# Patient Record
Sex: Male | Born: 1945 | Race: White | Hispanic: No | Marital: Married | Smoking: Former smoker
Health system: Southern US, Community
[De-identification: ages and names within clinical notes are randomized; demographics above are authoritative.]

## PROBLEM LIST (undated history)

## (undated) DIAGNOSIS — I509 Heart failure, unspecified: Secondary | ICD-10-CM

## (undated) DIAGNOSIS — E039 Hypothyroidism, unspecified: Secondary | ICD-10-CM

## (undated) DIAGNOSIS — N289 Disorder of kidney and ureter, unspecified: Secondary | ICD-10-CM

## (undated) DIAGNOSIS — R0600 Dyspnea, unspecified: Secondary | ICD-10-CM

## (undated) DIAGNOSIS — G4730 Sleep apnea, unspecified: Secondary | ICD-10-CM

## (undated) DIAGNOSIS — K5904 Chronic idiopathic constipation: Secondary | ICD-10-CM

## (undated) DIAGNOSIS — R269 Unspecified abnormalities of gait and mobility: Secondary | ICD-10-CM

## (undated) DIAGNOSIS — M1990 Unspecified osteoarthritis, unspecified site: Secondary | ICD-10-CM

## (undated) DIAGNOSIS — G959 Disease of spinal cord, unspecified: Secondary | ICD-10-CM

## (undated) DIAGNOSIS — G2581 Restless legs syndrome: Secondary | ICD-10-CM

## (undated) DIAGNOSIS — R112 Nausea with vomiting, unspecified: Secondary | ICD-10-CM

## (undated) DIAGNOSIS — J189 Pneumonia, unspecified organism: Secondary | ICD-10-CM

## (undated) DIAGNOSIS — M4714 Other spondylosis with myelopathy, thoracic region: Secondary | ICD-10-CM

## (undated) DIAGNOSIS — E119 Type 2 diabetes mellitus without complications: Secondary | ICD-10-CM

## (undated) DIAGNOSIS — J449 Chronic obstructive pulmonary disease, unspecified: Secondary | ICD-10-CM

## (undated) DIAGNOSIS — M4807 Spinal stenosis, lumbosacral region: Secondary | ICD-10-CM

## (undated) DIAGNOSIS — G629 Polyneuropathy, unspecified: Secondary | ICD-10-CM

## (undated) DIAGNOSIS — R011 Cardiac murmur, unspecified: Secondary | ICD-10-CM

## (undated) DIAGNOSIS — Z9981 Dependence on supplemental oxygen: Secondary | ICD-10-CM

## (undated) DIAGNOSIS — I4510 Unspecified right bundle-branch block: Secondary | ICD-10-CM

## (undated) DIAGNOSIS — Z9889 Other specified postprocedural states: Secondary | ICD-10-CM

## (undated) DIAGNOSIS — I10 Essential (primary) hypertension: Secondary | ICD-10-CM

## (undated) HISTORY — DX: Polyneuropathy, unspecified: G62.9

## (undated) HISTORY — DX: Spinal stenosis, lumbosacral region: M48.07

## (undated) HISTORY — DX: Heart failure, unspecified: I50.9

## (undated) HISTORY — PX: BACK SURGERY: SHX140

## (undated) HISTORY — DX: Pneumonia, unspecified organism: J18.9

## (undated) HISTORY — PX: EYE SURGERY: SHX253

## (undated) HISTORY — DX: Disease of spinal cord, unspecified: G95.9

## (undated) HISTORY — PX: CHOLECYSTECTOMY: SHX55

## (undated) HISTORY — DX: Restless legs syndrome: G25.81

## (undated) HISTORY — DX: Unspecified abnormalities of gait and mobility: R26.9

## (undated) HISTORY — DX: Other spondylosis with myelopathy, thoracic region: M47.14

---

## 1999-06-27 ENCOUNTER — Encounter: Payer: Self-pay | Admitting: Neurological Surgery

## 1999-06-27 ENCOUNTER — Inpatient Hospital Stay (HOSPITAL_COMMUNITY): Admission: RE | Admit: 1999-06-27 | Discharge: 1999-06-30 | Payer: Self-pay | Admitting: Neurological Surgery

## 1999-09-04 ENCOUNTER — Encounter: Admission: RE | Admit: 1999-09-04 | Discharge: 1999-10-02 | Payer: Self-pay | Admitting: Neurological Surgery

## 1999-11-27 ENCOUNTER — Encounter: Admission: RE | Admit: 1999-11-27 | Discharge: 1999-11-27 | Payer: Self-pay | Admitting: Neurological Surgery

## 1999-11-27 ENCOUNTER — Encounter: Payer: Self-pay | Admitting: Neurological Surgery

## 2000-05-30 ENCOUNTER — Encounter: Payer: Self-pay | Admitting: Neurological Surgery

## 2000-05-30 ENCOUNTER — Encounter: Admission: RE | Admit: 2000-05-30 | Discharge: 2000-05-30 | Payer: Self-pay | Admitting: Neurological Surgery

## 2001-07-16 ENCOUNTER — Encounter: Admission: RE | Admit: 2001-07-16 | Discharge: 2001-07-16 | Payer: Self-pay | Admitting: Neurological Surgery

## 2001-07-16 ENCOUNTER — Encounter: Payer: Self-pay | Admitting: Neurological Surgery

## 2001-07-19 ENCOUNTER — Encounter: Admission: RE | Admit: 2001-07-19 | Discharge: 2001-07-19 | Payer: Self-pay | Admitting: Neurological Surgery

## 2001-07-19 ENCOUNTER — Encounter: Payer: Self-pay | Admitting: Neurological Surgery

## 2001-08-04 ENCOUNTER — Encounter: Admission: RE | Admit: 2001-08-04 | Discharge: 2001-08-19 | Payer: Self-pay | Admitting: Neurological Surgery

## 2002-02-23 ENCOUNTER — Encounter: Admission: RE | Admit: 2002-02-23 | Discharge: 2002-02-23 | Payer: Self-pay | Admitting: Neurological Surgery

## 2002-02-23 ENCOUNTER — Encounter: Payer: Self-pay | Admitting: Neurological Surgery

## 2002-02-24 ENCOUNTER — Encounter: Admission: RE | Admit: 2002-02-24 | Discharge: 2002-02-24 | Payer: Self-pay | Admitting: Orthopedic Surgery

## 2002-02-24 ENCOUNTER — Encounter: Payer: Self-pay | Admitting: Orthopedic Surgery

## 2002-03-10 ENCOUNTER — Encounter: Payer: Self-pay | Admitting: Neurological Surgery

## 2002-03-10 ENCOUNTER — Ambulatory Visit (HOSPITAL_COMMUNITY): Admission: RE | Admit: 2002-03-10 | Discharge: 2002-03-10 | Payer: Self-pay | Admitting: Neurological Surgery

## 2004-12-09 ENCOUNTER — Ambulatory Visit (HOSPITAL_BASED_OUTPATIENT_CLINIC_OR_DEPARTMENT_OTHER): Admission: RE | Admit: 2004-12-09 | Discharge: 2004-12-09 | Payer: Self-pay | Admitting: Otolaryngology

## 2005-05-14 ENCOUNTER — Encounter: Admission: RE | Admit: 2005-05-14 | Discharge: 2005-05-14 | Payer: Self-pay | Admitting: Neurological Surgery

## 2005-06-20 ENCOUNTER — Emergency Department (HOSPITAL_COMMUNITY): Admission: EM | Admit: 2005-06-20 | Discharge: 2005-06-20 | Payer: Self-pay | Admitting: Emergency Medicine

## 2005-06-23 ENCOUNTER — Encounter: Admission: RE | Admit: 2005-06-23 | Discharge: 2005-06-23 | Payer: Self-pay | Admitting: Medical Genetics

## 2005-06-27 ENCOUNTER — Encounter: Admission: RE | Admit: 2005-06-27 | Discharge: 2005-06-27 | Payer: Self-pay | Admitting: Medical Genetics

## 2006-08-06 ENCOUNTER — Encounter: Admission: RE | Admit: 2006-08-06 | Discharge: 2006-08-06 | Payer: Self-pay | Admitting: Family Medicine

## 2006-08-28 ENCOUNTER — Encounter
Admission: RE | Admit: 2006-08-28 | Discharge: 2006-08-28 | Payer: Self-pay | Admitting: Physical Medicine and Rehabilitation

## 2006-09-08 ENCOUNTER — Encounter: Admission: RE | Admit: 2006-09-08 | Discharge: 2006-09-08 | Payer: Self-pay | Admitting: Neurological Surgery

## 2006-10-08 ENCOUNTER — Inpatient Hospital Stay (HOSPITAL_COMMUNITY): Admission: RE | Admit: 2006-10-08 | Discharge: 2006-10-09 | Payer: Self-pay | Admitting: Neurological Surgery

## 2008-07-23 ENCOUNTER — Emergency Department (HOSPITAL_BASED_OUTPATIENT_CLINIC_OR_DEPARTMENT_OTHER): Admission: EM | Admit: 2008-07-23 | Discharge: 2008-07-23 | Payer: Self-pay | Admitting: Emergency Medicine

## 2008-11-08 ENCOUNTER — Encounter: Admission: RE | Admit: 2008-11-08 | Discharge: 2008-11-08 | Payer: Self-pay | Admitting: Neurological Surgery

## 2008-11-13 ENCOUNTER — Encounter: Admission: RE | Admit: 2008-11-13 | Discharge: 2008-11-13 | Payer: Self-pay | Admitting: Neurological Surgery

## 2010-04-24 ENCOUNTER — Encounter: Admission: RE | Admit: 2010-04-24 | Discharge: 2010-04-24 | Payer: Self-pay | Admitting: Neurological Surgery

## 2010-05-09 ENCOUNTER — Inpatient Hospital Stay (HOSPITAL_COMMUNITY): Admission: RE | Admit: 2010-05-09 | Discharge: 2010-05-10 | Payer: Self-pay | Admitting: Neurological Surgery

## 2010-12-12 ENCOUNTER — Encounter
Admission: RE | Admit: 2010-12-12 | Discharge: 2010-12-12 | Payer: Self-pay | Source: Home / Self Care | Attending: Neurological Surgery | Admitting: Neurological Surgery

## 2011-01-19 ENCOUNTER — Encounter: Payer: Self-pay | Admitting: Medical Genetics

## 2011-03-18 LAB — SURGICAL PCR SCREEN
MRSA, PCR: NEGATIVE
Staphylococcus aureus: NEGATIVE

## 2011-03-18 LAB — PROTIME-INR
INR: 0.98 (ref 0.00–1.49)
Prothrombin Time: 12.9 seconds (ref 11.6–15.2)

## 2011-03-18 LAB — BASIC METABOLIC PANEL
BUN: 19 mg/dL (ref 6–23)
CO2: 26 mEq/L (ref 19–32)
Calcium: 9.5 mg/dL (ref 8.4–10.5)
GFR calc non Af Amer: >60 mL/min (ref >60)
Glucose, Bld: 138 mg/dL — ABNORMAL HIGH (ref 70–99)
Sodium: 139 mEq/L (ref 135–145)

## 2011-03-18 LAB — CARDIAC PANEL(CRET KIN+CKTOT+MB+TROPI)
Relative Index: 3 — ABNORMAL HIGH (ref 0.0–2.5)
Troponin I: 0.02 ng/mL (ref 0.00–0.06)

## 2011-03-18 LAB — DIFFERENTIAL
Eosinophils Absolute: 0.1 10*3/uL (ref 0.0–0.7)
Lymphocytes Relative: 24 % (ref 12–46)
Lymphs Abs: 1 10*3/uL (ref 0.7–4.0)
Monocytes Relative: 9 % (ref 3–12)
Neutrophils Relative %: 63 % (ref 43–77)

## 2011-03-18 LAB — CBC
HCT: 34.5 % — ABNORMAL LOW (ref 39.0–52.0)
Hemoglobin: 12.1 g/dL — ABNORMAL LOW (ref 13.0–17.0)
MCHC: 35.1 g/dL (ref 30.0–36.0)
MCV: 91.9 fL (ref 78.0–100.0)
MCV: 92.1 fL (ref 78.0–100.0)
Platelets: 172 10*3/uL (ref 150–400)
Platelets: 196 10*3/uL (ref 150–400)
RBC: 4.43 MIL/uL (ref 4.22–5.81)
RDW: 13.7 % (ref 11.5–15.5)
WBC: 4.3 10*3/uL (ref 4.0–10.5)

## 2011-03-18 LAB — APTT: aPTT: 28 seconds (ref 24–37)

## 2011-05-16 NOTE — Op Note (Signed)
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   PROCEDURE:  Thoracic hemilaminectomy, medial facetectomy, T8-9 and T9-10 on  the right, followed by transpedicular diskectomy T8-9, T9-10 on the right,  utilizing microscopic dissection.   SURGEON:  Eustace Moore, MD   ASSISTANT:  Faythe Ghee, MD.   ANESTHESIA:  General endotracheal.   COMPLICATIONS:  None apparent.   INDICATION FOR PROCEDURE:  Mr. Marshall is a 65 year old male who has very  complicated spine history.  He had a previous lumbar laminectomy in the  past.  He then became myelopathic and was found to have thoracic stenosis  and cervical spinal stenosis.  He underwent a multilevel cervical  decompression and instrumented fusion by another surgeon 1 or 2 years ago.  Over the last few months he has developed a return of lower extremity  symptoms, including spasticity of gait with numbness and pain in the legs.  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  I recommended a thoracic  decompression followed by transpedicular diskectomy.  He understood the  risks, benefits and expected outcome and wished to proceed.   DESCRIPTION OF PROCEDURE:  The patient was taken to the operating room and  after induction of adequate generalized  endotracheal anesthesia, he was  rolled into the prone position on the Wilson frame and all pressure points  were padded.  His thoracic region was prepped with DuraPrep and then draped  in the usual sterile fashion.  Local anesthesia 6 mL were injected and a  dorsal midline incision was made and carried down to the thoracic fascia.  The fascia was opened and the paraspinous musculature was taken down in a  subperiosteal fashion to expose T8-9 and T9-10.  Intraoperative x-ray  confirmed our level.  We took multiple intraoperative x-rays throughout the  case to make sure we were at the correct level.  We felt quite confident we  were at %8-9 and T9-10 based on these x-rays and called down to the  radiologist and had them read the x-rays also, and they confirmed our  levels.  We used a combination of the high-speed drill and the Kerrison  punches to perform a complete hemilaminectomy at T9-10 and a hemilaminectomy  at T8-9.  A medial facetectomy was performed.  The pedicles were palpated.  We also undercut the midline with the Kerrison punch to decompress the  midline also, performing  somewhat of a sublaminar decompression at both  levels.  I then used the high-speed drill to drill down into the pedicle and  down into the vertebra body, creating a trough up to the level of the disk  space and utilizing microscopic dissection, I was able to incise the disk  space at T8-9 and we performed a thorough intradiskal diskectomy.  We were  able to use a nerve hook and a curette to work underneath the posterior  longitudinal ligament and sweep disk down into the hole created by drilling  into the body and then we removed this with the pituitary rongeur.  We used  micro pituitaries and a micro upbiting pituitary to perform our diskectomy.  We then palpated with a nerve hook, feeling into the midline to see if we  had adequate decompression of the midline.  We felt like we had as good a  decompression as  we could possibly due through a transpedicular approach.  We then went to the T8-9 level and performed the same thing, though the disk  space was very collapsed and we had quite a bit of difficult getting into  the disk space.  We were able to perform some diskectomy here.  We knew this  is a midline disk herniation and there was very little chance we would be  able to get to a midline disk herniation from a transpedicular approach.  However, as discussed with the patient prior to surgery, we did not feel  that a thoracotomy approach was absolutely necessary.  I felt like the T9-10  level was likely more symptomatic.  We also had what we felt like was a good  posterior decompression.  Therefore, we felt it would be the better part of  valor to stop here and irrigated with saline solution.  We dried all  bleeding points with bipolar cautery and also with Surgifoam and once  meticulous hemostasis was achieved, we lined the dura with Gelfoam and we  closed the fascia with #1 Vicryl, closed the subcutaneous and subcuticular  tissue with 2-0 and 3-0 Vicryl and closed the skin with Benzoin and Steri-  Strips.  The drapes were removed.  A sterile dressing was applied.  The  patient was awakened from general anesthesia and transported to the recovery  room in stable condition.  At the end of the procedure all sponge, needle  and sponge counts were correct.      Eustace Moore, MD  Electronically Signed     DSJ/MEDQ  D:  10/08/2006  T:  10/09/2006  Job:  [id number]

## 2011-05-16 NOTE — Procedures (Signed)
Travis [name], Travis [name]               ACCOUNT NO.:  [id number]   MEDICAL RECORD NO.:  [id number]          PATIENT TYPE:  OUT   LOCATION:  SLEEP CENTER                 FACILITY:  [facility]   PHYSICIAN:  Clinton D. Annamaria Boots, M.D. DATE OF BIRTH:  1946/10/21   DATE OF STUDY:  12/09/2004                              NOCTURNAL POLYSOMNOGRAM   REFERRING PHYSICIAN:  Dr. Jerrell Belfast.   INDICATION FOR STUDY:  1.  Hypersomnia with sleep apnea.  2.  Epworth Sleepiness Score 20/24.  3.  Neck size 18 inches.  4.  BMI 22.  5.  Weight 170 pounds.   SLEEP ARCHITECTURE:  Total sleep time 263 minutes with sleep efficiency 72%.  Stage 1 was 7%, stage 2 73%, stages 3 and 4 were 5%, REM was 15% of the  total sleep time.  Sleep latency 34 minutes.  REM latency 208 minutes.  Awake after sleep onset 70 minutes.  Arousal index elevated 46, mostly due  to respiratory events.   RESPIRATORY DATA:  Split study protocol.  RDI 90.6/h indicating severe  obstructive sleep apnea/hypopnea syndrome before CPAP.  This included 169  obstructive apneas, 10 mixed apnea and 12 hypopneas before CPAP.  Events  were not positional.  REM RDI was absent.  CPAP was titrated to 12 CWP, RDI  0/h.  Apneas were eliminated at lower pressures, but snoring was eliminated  at 12 CWP.  A medium full-face mask with heated humidifier was used.   OXYGEN DATA:  Moderately loud snoring with oxygen desaturation to a nadir of  76% before CPAP.  After CPAP control, saturation held at 95-98% on room air.   CARDIAC DATA:  Normal sinus rhythm and sinus bradycardia with heart rate 55-  70 beats per minute.   MOVEMENT/PARASOMNIA:  Occasional leg jerk with insignificant impact on  sleep.  Bathroom x2.   IMPRESSION/RECOMMENDATION:  Severe obstructive sleep apnea/hypopnea  syndrome, RDI 90.6/h with oxygen desaturation to 76%.  CPAP titration to 12  CWP, RDI 0/h using a medium full-face mask with heated humidifier.                            Clinton D. Annamaria Boots, M.D.  Diplomate, [employer] Board  CDY/MEDQ  D:  12/15/2004 10:48:43  T:  12/16/2004 14:32:00  Job:  [id number]

## 2011-12-25 ENCOUNTER — Other Ambulatory Visit: Payer: Self-pay | Admitting: Neurological Surgery

## 2011-12-25 DIAGNOSIS — M545 Low back pain, unspecified: Secondary | ICD-10-CM

## 2011-12-28 ENCOUNTER — Ambulatory Visit
Admission: RE | Admit: 2011-12-28 | Discharge: 2011-12-28 | Disposition: A | Discharge status: Home / Self Care | Payer: BLUE CROSS/BLUE SHIELD | Source: Ambulatory Visit | Attending: Neurological Surgery | Admitting: Neurological Surgery

## 2011-12-28 DIAGNOSIS — M545 Low back pain, unspecified: Secondary | ICD-10-CM

## 2011-12-29 ENCOUNTER — Other Ambulatory Visit: Payer: Self-pay | Admitting: Neurological Surgery

## 2011-12-29 DIAGNOSIS — M542 Cervicalgia: Secondary | ICD-10-CM

## 2011-12-31 ENCOUNTER — Other Ambulatory Visit: Payer: BLUE CROSS/BLUE SHIELD

## 2012-01-01 ENCOUNTER — Ambulatory Visit
Admission: RE | Admit: 2012-01-01 | Discharge: 2012-01-01 | Disposition: A | Discharge status: Home / Self Care | Payer: BLUE CROSS/BLUE SHIELD | Source: Ambulatory Visit | Attending: Neurological Surgery | Admitting: Neurological Surgery

## 2012-01-01 DIAGNOSIS — M542 Cervicalgia: Secondary | ICD-10-CM

## 2012-01-28 ENCOUNTER — Encounter (HOSPITAL_COMMUNITY): Payer: Self-pay | Admitting: Pharmacy Technician

## 2012-02-02 ENCOUNTER — Other Ambulatory Visit (HOSPITAL_COMMUNITY): Payer: Self-pay | Admitting: *Deleted

## 2012-02-03 ENCOUNTER — Encounter (HOSPITAL_COMMUNITY): Payer: Self-pay

## 2012-02-03 ENCOUNTER — Encounter (HOSPITAL_COMMUNITY)
Admission: RE | Admit: 2012-02-03 | Discharge: 2012-02-03 | Disposition: A | Discharge status: Home / Self Care | Payer: Medicare Other | Source: Ambulatory Visit | Attending: Neurological Surgery | Admitting: Neurological Surgery

## 2012-02-03 ENCOUNTER — Other Ambulatory Visit: Payer: Self-pay

## 2012-02-03 HISTORY — DX: Unspecified osteoarthritis, unspecified site: M19.90

## 2012-02-03 HISTORY — DX: Nausea with vomiting, unspecified: R11.2

## 2012-02-03 HISTORY — DX: Essential (primary) hypertension: I10

## 2012-02-03 HISTORY — DX: Sleep apnea, unspecified: G47.30

## 2012-02-03 HISTORY — DX: Cardiac murmur, unspecified: R01.1

## 2012-02-03 HISTORY — DX: Other specified postprocedural states: Z98.890

## 2012-02-03 LAB — DIFFERENTIAL
Basophils Absolute: 0 10*3/uL (ref 0.0–0.1)
Basophils Relative: 0 % (ref 0–1)
Eosinophils Relative: 3 % (ref 0–5)
Monocytes Absolute: 0.5 10*3/uL (ref 0.1–1.0)
Neutro Abs: 3.4 10*3/uL (ref 1.7–7.7)

## 2012-02-03 LAB — CBC
Hemoglobin: 14 g/dL (ref 13.0–17.0)
MCH: 31.5 pg (ref 26.0–34.0)
MCV: 89 fL (ref 78.0–100.0)
Platelets: 175 10*3/uL (ref 150–400)
RBC: 4.44 MIL/uL (ref 4.22–5.81)
WBC: 5.7 10*3/uL (ref 4.0–10.5)

## 2012-02-03 LAB — COMPREHENSIVE METABOLIC PANEL
ALT: 25 U/L (ref 0–53)
AST: 21 U/L (ref 0–37)
CO2: 28 mEq/L (ref 19–32)
Chloride: 103 mEq/L (ref 96–112)
Creatinine, Ser: 1.03 mg/dL (ref 0.50–1.35)
GFR calc Af Amer: 85 mL/min — ABNORMAL LOW (ref >90)
GFR calc non Af Amer: 73 mL/min — ABNORMAL LOW (ref >90)
Glucose, Bld: 112 mg/dL — ABNORMAL HIGH (ref 70–99)
Total Bilirubin: 0.4 mg/dL (ref 0.3–1.2)

## 2012-02-03 LAB — PROTIME-INR: Prothrombin Time: 12.7 seconds (ref 11.6–15.2)

## 2012-02-03 NOTE — Progress Notes (Signed)
PT DOES NOT WANT TO WEAR BLUE ARM BAND X 8 DAYS...Marland Kitchenhe IS AWARE THAT SAMPLE WILL NEED TO BE DRAWN DOS

## 2012-02-03 NOTE — Pre-Procedure Instructions (Signed)
Travis [name]  02/03/2012   Your procedure is scheduled on:  FEB 13TH  Wednesday   Report to [place] at  10:00 AM.  Call this number if you have problems the morning of surgery: [phone]   Remember:   Do not eat food:After Midnight SUNDAY.  May have clear liquids: up to 4 Hours before arrival time  (6:00AM).  Clear liquids include soda, tea, black coffee, apple or grape juice, broth.   Take these medicines the morning of surgery with A SIP OF WATER: TOPROL X                            SYNTHROID, GABAPENTIN   Do not wear jewelry, make-up or nail polish.   Do not wear lotions, powders, or perfumes. You may wear deodorant.  Do not bring valuables to the hospital.   Contacts, dentures or bridgework may not be worn into surgery.  Leave suitcase in the car. After surgery it may be brought to your room.  For patients admitted to the hospital, checkout time is 11:00 AM the day of discharge.   Patients discharged the day of surgery will not be allowed to drive home.  Name and phone number of your driver:  Travis [name] ---WIFE  Special Instructions: CHG Shower Use Special Wash: 1/2 bottle night before surgery and 1/2 bottle morning of surgery.   Please read over the following fact sheets that you were given: Pain Booklet, Blood Transfusion Information, MRSA Information and Surgical Site Infection Prevention

## 2012-02-10 MED ORDER — CEFAZOLIN SODIUM-DEXTROSE 2-3 GM-% IV SOLR
2.0000 g | INTRAVENOUS | Status: DC
  Filled 2012-02-10: qty 50

## 2012-02-11 ENCOUNTER — Encounter (HOSPITAL_COMMUNITY): Payer: Self-pay | Admitting: *Deleted

## 2012-02-11 ENCOUNTER — Encounter (HOSPITAL_COMMUNITY): Payer: Self-pay | Admitting: Anesthesiology

## 2012-02-11 ENCOUNTER — Ambulatory Visit (HOSPITAL_COMMUNITY): Payer: Medicare Other

## 2012-02-11 ENCOUNTER — Encounter (HOSPITAL_COMMUNITY)
Admission: RE | Disposition: A | Discharge status: Home / Self Care | Payer: Self-pay | Source: Ambulatory Visit | Attending: Neurological Surgery

## 2012-02-11 ENCOUNTER — Inpatient Hospital Stay (HOSPITAL_COMMUNITY)
Admission: RE | Admit: 2012-02-11 | Discharge: 2012-02-12 | DRG: 460 | Disposition: A | Discharge status: Home / Self Care | Payer: Medicare Other | Source: Ambulatory Visit | Attending: Neurological Surgery | Admitting: Neurological Surgery

## 2012-02-11 ENCOUNTER — Ambulatory Visit (HOSPITAL_COMMUNITY): Payer: Medicare Other | Admitting: Anesthesiology

## 2012-02-11 DIAGNOSIS — M47812 Spondylosis without myelopathy or radiculopathy, cervical region: Secondary | ICD-10-CM | POA: Diagnosis present

## 2012-02-11 DIAGNOSIS — M4324 Fusion of spine, thoracic region: Secondary | ICD-10-CM

## 2012-02-11 DIAGNOSIS — Z01812 Encounter for preprocedural laboratory examination: Secondary | ICD-10-CM

## 2012-02-11 DIAGNOSIS — I10 Essential (primary) hypertension: Secondary | ICD-10-CM | POA: Diagnosis present

## 2012-02-11 DIAGNOSIS — M47814 Spondylosis without myelopathy or radiculopathy, thoracic region: Principal | ICD-10-CM | POA: Diagnosis present

## 2012-02-11 DIAGNOSIS — Z87891 Personal history of nicotine dependence: Secondary | ICD-10-CM

## 2012-02-11 DIAGNOSIS — M47817 Spondylosis without myelopathy or radiculopathy, lumbosacral region: Secondary | ICD-10-CM | POA: Diagnosis present

## 2012-02-11 DIAGNOSIS — Z79899 Other long term (current) drug therapy: Secondary | ICD-10-CM

## 2012-02-11 LAB — TYPE AND SCREEN

## 2012-02-11 SURGERY — POSTERIOR LUMBAR FUSION 1 LEVEL
Anesthesia: General | Site: Back | Wound class: Clean

## 2012-02-11 MED ORDER — CYCLOBENZAPRINE HCL 10 MG PO TABS
10.0000 mg | ORAL_TABLET | Freq: Three times a day (TID) | ORAL | Status: DC | PRN
  Administered 2012-02-11 – 2012-02-12 (×2): 10 mg via ORAL
  Filled 2012-02-11 (×2): qty 1

## 2012-02-11 MED ORDER — ACETAMINOPHEN 650 MG RE SUPP: 650.0000 mg | RECTAL | Status: DC | PRN

## 2012-02-11 MED ORDER — PHENYLEPHRINE HCL 10 MG/ML IJ SOLN
20.0000 mg | INTRAVENOUS | Status: DC | PRN
  Administered 2012-02-11: 10 ug/min via INTRAVENOUS

## 2012-02-11 MED ORDER — HYDROCHLOROTHIAZIDE 12.5 MG PO CAPS
12.5000 mg | ORAL_CAPSULE | Freq: Every day | ORAL | Status: DC
  Administered 2012-02-11 – 2012-02-12 (×2): 12.5 mg via ORAL
  Filled 2012-02-11 (×2): qty 1

## 2012-02-11 MED ORDER — SODIUM CHLORIDE 0.9 % IJ SOLN: 3.0000 mL | INTRAMUSCULAR | Status: DC | PRN

## 2012-02-11 MED ORDER — METOPROLOL SUCCINATE ER 25 MG PO TB24
25.0000 mg | ORAL_TABLET | Freq: Every day | ORAL | Status: DC
  Administered 2012-02-11 – 2012-02-12 (×2): 25 mg via ORAL
  Filled 2012-02-11 (×2): qty 1

## 2012-02-11 MED ORDER — MORPHINE SULFATE 2 MG/ML IJ SOLN: 0.0500 mg/kg | INTRAMUSCULAR | Status: DC | PRN

## 2012-02-11 MED ORDER — POTASSIUM CHLORIDE IN NACL 20-0.9 MEQ/L-% IV SOLN
INTRAVENOUS | Status: DC
  Administered 2012-02-11: 21:00:00 via INTRAVENOUS
  Filled 2012-02-11 (×3): qty 1000

## 2012-02-11 MED ORDER — LACTATED RINGERS IV SOLN: INTRAVENOUS | Status: DC

## 2012-02-11 MED ORDER — CEFAZOLIN SODIUM 1-5 GM-% IV SOLN
1.0000 g | Freq: Three times a day (TID) | INTRAVENOUS | Status: AC
  Administered 2012-02-11 – 2012-02-12 (×2): 1 g via INTRAVENOUS
  Filled 2012-02-11 (×2): qty 50

## 2012-02-11 MED ORDER — PHENOL 1.4 % MT LIQD: 1.0000 | OROMUCOSAL | Status: DC | PRN

## 2012-02-11 MED ORDER — GLYCOPYRROLATE 0.2 MG/ML IJ SOLN
INTRAMUSCULAR | Status: DC | PRN
  Administered 2012-02-11: .6 mg via INTRAVENOUS

## 2012-02-11 MED ORDER — ONDANSETRON HCL 4 MG/2ML IJ SOLN: 4.0000 mg | INTRAMUSCULAR | Status: DC | PRN

## 2012-02-11 MED ORDER — SODIUM CHLORIDE 0.9 % IJ SOLN
3.0000 mL | Freq: Two times a day (BID) | INTRAMUSCULAR | Status: DC
  Administered 2012-02-11: 3 mL via INTRAVENOUS

## 2012-02-11 MED ORDER — HETASTARCH-ELECTROLYTES 6 % IV SOLN
INTRAVENOUS | Status: DC | PRN
  Administered 2012-02-11: 13:00:00 via INTRAVENOUS

## 2012-02-11 MED ORDER — MEPERIDINE HCL 25 MG/ML IJ SOLN: 6.2500 mg | INTRAMUSCULAR | Status: DC | PRN

## 2012-02-11 MED ORDER — LISINOPRIL-HYDROCHLOROTHIAZIDE 20-12.5 MG PO TABS: 1.0000 | ORAL_TABLET | Freq: Every day | ORAL | Status: DC

## 2012-02-11 MED ORDER — ROCURONIUM BROMIDE 100 MG/10ML IV SOLN
INTRAVENOUS | Status: DC | PRN
  Administered 2012-02-11 (×3): 10 mg via INTRAVENOUS
  Administered 2012-02-11: 50 mg via INTRAVENOUS

## 2012-02-11 MED ORDER — LEVOTHYROXINE SODIUM 150 MCG PO TABS
150.0000 ug | ORAL_TABLET | Freq: Every day | ORAL | Status: DC
  Administered 2012-02-12: 150 ug via ORAL
  Filled 2012-02-11: qty 1

## 2012-02-11 MED ORDER — SODIUM CHLORIDE 0.9 % IV SOLN
INTRAVENOUS | Status: AC
  Filled 2012-02-11: qty 500

## 2012-02-11 MED ORDER — SENNA 8.6 MG PO TABS
1.0000 | ORAL_TABLET | Freq: Two times a day (BID) | ORAL | Status: DC
  Administered 2012-02-11 – 2012-02-12 (×2): 8.6 mg via ORAL
  Filled 2012-02-11 (×3): qty 1

## 2012-02-11 MED ORDER — HYDROMORPHONE HCL PF 1 MG/ML IJ SOLN: 0.2500 mg | INTRAMUSCULAR | Status: DC | PRN

## 2012-02-11 MED ORDER — MIDAZOLAM HCL 5 MG/5ML IJ SOLN
INTRAMUSCULAR | Status: DC | PRN
  Administered 2012-02-11 (×2): 1 mg via INTRAVENOUS

## 2012-02-11 MED ORDER — LACTATED RINGERS IV SOLN
INTRAVENOUS | Status: DC | PRN
  Administered 2012-02-11 (×2): via INTRAVENOUS

## 2012-02-11 MED ORDER — PRAMIPEXOLE DIHYDROCHLORIDE 0.25 MG PO TABS
0.2500 mg | ORAL_TABLET | Freq: Every day | ORAL | Status: DC
  Administered 2012-02-11 – 2012-02-12 (×2): 0.25 mg via ORAL
  Filled 2012-02-11 (×2): qty 1

## 2012-02-11 MED ORDER — MORPHINE SULFATE 4 MG/ML IJ SOLN
1.0000 mg | INTRAMUSCULAR | Status: DC | PRN
  Administered 2012-02-12: 1 mg via INTRAVENOUS
  Administered 2012-02-12: 2 mg via INTRAVENOUS
  Filled 2012-02-11 (×2): qty 1

## 2012-02-11 MED ORDER — NEOSTIGMINE METHYLSULFATE 1 MG/ML IJ SOLN
INTRAMUSCULAR | Status: DC | PRN
  Administered 2012-02-11: 5 mg via INTRAVENOUS

## 2012-02-11 MED ORDER — PROPOFOL 10 MG/ML IV BOLUS
INTRAVENOUS | Status: DC | PRN
  Administered 2012-02-11: 200 mg via INTRAVENOUS

## 2012-02-11 MED ORDER — BACITRACIN 50000 UNITS IM SOLR
INTRAMUSCULAR | Status: AC
  Filled 2012-02-11: qty 1

## 2012-02-11 MED ORDER — THROMBIN 20000 UNITS EX KIT
PACK | CUTANEOUS | Status: DC | PRN
  Administered 2012-02-11: 13:00:00 via TOPICAL

## 2012-02-11 MED ORDER — PROMETHAZINE HCL 25 MG/ML IJ SOLN: 6.2500 mg | INTRAMUSCULAR | Status: DC | PRN

## 2012-02-11 MED ORDER — ACETAMINOPHEN 325 MG PO TABS: 650.0000 mg | ORAL_TABLET | ORAL | Status: DC | PRN

## 2012-02-11 MED ORDER — BUPIVACAINE HCL (PF) 0.25 % IJ SOLN
INTRAMUSCULAR | Status: DC | PRN
  Administered 2012-02-11: 30 mL

## 2012-02-11 MED ORDER — PHENYLEPHRINE HCL 10 MG/ML IJ SOLN
INTRAMUSCULAR | Status: DC | PRN
  Administered 2012-02-11: 40 ug via INTRAVENOUS

## 2012-02-11 MED ORDER — LISINOPRIL 20 MG PO TABS
20.0000 mg | ORAL_TABLET | Freq: Every day | ORAL | Status: DC
  Administered 2012-02-11 – 2012-02-12 (×2): 20 mg via ORAL
  Filled 2012-02-11 (×2): qty 1

## 2012-02-11 MED ORDER — DEXAMETHASONE SODIUM PHOSPHATE 10 MG/ML IJ SOLN
10.0000 mg | Freq: Once | INTRAMUSCULAR | Status: AC
  Administered 2012-02-11: 10 mg via INTRAVENOUS
  Filled 2012-02-11 (×2): qty 1

## 2012-02-11 MED ORDER — SODIUM CHLORIDE 0.9 % IR SOLN
Status: DC | PRN
  Administered 2012-02-11: 13:00:00

## 2012-02-11 MED ORDER — SUFENTANIL CITRATE 50 MCG/ML IV SOLN
INTRAVENOUS | Status: DC | PRN
  Administered 2012-02-11: 5 ug via INTRAVENOUS
  Administered 2012-02-11: 30 ug via INTRAVENOUS
  Administered 2012-02-11 (×2): 10 ug via INTRAVENOUS
  Administered 2012-02-11: 5 ug via INTRAVENOUS

## 2012-02-11 MED ORDER — SODIUM CHLORIDE 0.9 % IV SOLN: 250.0000 mL | INTRAVENOUS | Status: DC

## 2012-02-11 MED ORDER — 0.9 % SODIUM CHLORIDE (POUR BTL) OPTIME
TOPICAL | Status: DC | PRN
  Administered 2012-02-11: 1000 mL

## 2012-02-11 MED ORDER — GABAPENTIN 600 MG PO TABS
600.0000 mg | ORAL_TABLET | Freq: Three times a day (TID) | ORAL | Status: DC
  Administered 2012-02-11 – 2012-02-12 (×2): 600 mg via ORAL
  Filled 2012-02-11 (×4): qty 1

## 2012-02-11 MED ORDER — MENTHOL 3 MG MT LOZG
1.0000 | LOZENGE | OROMUCOSAL | Status: DC | PRN
  Administered 2012-02-11: 3 mg via ORAL
  Filled 2012-02-11 (×2): qty 9

## 2012-02-11 MED ORDER — ONDANSETRON HCL 4 MG/2ML IJ SOLN
INTRAMUSCULAR | Status: DC | PRN
  Administered 2012-02-11 (×2): 4 mg via INTRAVENOUS

## 2012-02-11 MED ORDER — EPHEDRINE SULFATE 50 MG/ML IJ SOLN
INTRAMUSCULAR | Status: DC | PRN
  Administered 2012-02-11: 10 mg via INTRAVENOUS

## 2012-02-11 MED ORDER — CLONIDINE HCL 0.1 MG PO TABS
0.1000 mg | ORAL_TABLET | Freq: Once | ORAL | Status: AC
  Administered 2012-02-12: 0.1 mg via ORAL
  Filled 2012-02-11: qty 1

## 2012-02-11 MED ORDER — OXYCODONE-ACETAMINOPHEN 5-325 MG PO TABS
1.0000 | ORAL_TABLET | ORAL | Status: DC | PRN
  Administered 2012-02-11 (×2): 2 via ORAL
  Administered 2012-02-12: 1 via ORAL
  Administered 2012-02-12: 2 via ORAL
  Filled 2012-02-11 (×3): qty 2
  Filled 2012-02-11: qty 1

## 2012-02-11 SURGICAL SUPPLY — 67 items
110mm armada prebent rods 5.5 ×2 IMPLANT
APL SKNCLS STERI-STRIP NONHPOA (GAUZE/BANDAGES/DRESSINGS) ×1
BAG DECANTER FOR FLEXI CONT (MISCELLANEOUS) ×2 IMPLANT
BENZOIN TINCTURE PRP APPL 2/3 (GAUZE/BANDAGES/DRESSINGS) ×2 IMPLANT
BLADE SURG ROTATE 9660 (MISCELLANEOUS) IMPLANT
BUR MATCHSTICK NEURO 3.0 LAGG (BURR) ×2 IMPLANT
CANISTER SUCTION 2500CC (MISCELLANEOUS) ×2 IMPLANT
CLOTH BEACON ORANGE TIMEOUT ST (SAFETY) ×2 IMPLANT
CONT SPEC 4OZ CLIKSEAL STRL BL (MISCELLANEOUS) ×4 IMPLANT
COVER BACK TABLE 24X17X13 BIG (DRAPES) IMPLANT
COVER TABLE BACK 60X90 (DRAPES) ×2 IMPLANT
DRAPE C-ARM 42X72 X-RAY (DRAPES) ×4 IMPLANT
DRAPE C-ARMOR (DRAPES) ×1 IMPLANT
DRAPE LAPAROTOMY 100X72X124 (DRAPES) ×2 IMPLANT
DRAPE POUCH INSTRU U-SHP 10X18 (DRAPES) ×2 IMPLANT
DRAPE SURG 17X23 STRL (DRAPES) ×2 IMPLANT
DRESSING TELFA 8X3 (GAUZE/BANDAGES/DRESSINGS) ×2 IMPLANT
DRSG OPSITE 4X5.5 SM (GAUZE/BANDAGES/DRESSINGS) ×4 IMPLANT
DRSG OPSITE 6X11 MED (GAUZE/BANDAGES/DRESSINGS) ×1 IMPLANT
DURAPREP 26ML APPLICATOR (WOUND CARE) ×2 IMPLANT
ELECT REM PT RETURN 9FT ADLT (ELECTROSURGICAL) ×2
ELECTRODE REM PT RTRN 9FT ADLT (ELECTROSURGICAL) ×1 IMPLANT
EVACUATOR 1/8 PVC DRAIN (DRAIN) ×2 IMPLANT
GAUZE SPONGE 4X4 16PLY XRAY LF (GAUZE/BANDAGES/DRESSINGS) IMPLANT
GLOVE BIO SURGEON STRL SZ8 (GLOVE) ×4 IMPLANT
GLOVE BIOGEL PI IND STRL 6.5 (GLOVE) IMPLANT
GLOVE BIOGEL PI IND STRL 7.0 (GLOVE) IMPLANT
GLOVE BIOGEL PI IND STRL 8 (GLOVE) IMPLANT
GLOVE BIOGEL PI INDICATOR 6.5 (GLOVE) ×4
GLOVE BIOGEL PI INDICATOR 7.0 (GLOVE) ×1
GLOVE BIOGEL PI INDICATOR 8 (GLOVE) ×1
GLOVE ECLIPSE 7.5 STRL STRAW (GLOVE) ×1 IMPLANT
GLOVE EXAM NITRILE MD LF STRL (GLOVE) ×1 IMPLANT
GLOVE INDICATOR 6.5 STRL GRN (GLOVE) ×2 IMPLANT
GLOVE INDICATOR 7.0 STRL GRN (GLOVE) ×1 IMPLANT
GOWN BRE IMP SLV AUR LG STRL (GOWN DISPOSABLE) ×1 IMPLANT
GOWN BRE IMP SLV AUR XL STRL (GOWN DISPOSABLE) ×6 IMPLANT
GOWN STRL REIN 2XL LVL4 (GOWN DISPOSABLE) IMPLANT
GRAFT BN 10X1XDBM MAGNIFUSE (Bone Implant) IMPLANT
GRAFT BONE MAGNIFUSE 1X10CM (Bone Implant) ×2 IMPLANT
HEMOSTAT POWDER KIT SURGIFOAM (HEMOSTASIS) IMPLANT
KIT BASIN OR (CUSTOM PROCEDURE TRAY) ×2 IMPLANT
KIT ROOM TURNOVER OR (KITS) ×2 IMPLANT
MIX DBX 10CC 35% BONE (Bone Implant) ×1 IMPLANT
NDL HYPO 25X1 1.5 SAFETY (NEEDLE) ×1 IMPLANT
NEEDLE HYPO 25X1 1.5 SAFETY (NEEDLE) ×2 IMPLANT
NS IRRIG 1000ML POUR BTL (IV SOLUTION) ×2 IMPLANT
PACK LAMINECTOMY NEURO (CUSTOM PROCEDURE TRAY) ×2 IMPLANT
PAD ARMBOARD 7.5X6 YLW CONV (MISCELLANEOUS) ×6 IMPLANT
SCREW LOCK (Screw) ×16 IMPLANT
SCREW LOCK 100X5.5X OPN (Screw) IMPLANT
SCREW POLY 45X6.5 (Screw) IMPLANT
SCREW POLY 5.5X45MM (Screw) ×6 IMPLANT
SCREW POLY 6.5X45MM (Screw) ×4 IMPLANT
SPONGE LAP 4X18 X RAY DECT (DISPOSABLE) IMPLANT
SPONGE SURGIFOAM ABS GEL 100 (HEMOSTASIS) ×2 IMPLANT
STRIP CLOSURE SKIN 1/2X4 (GAUZE/BANDAGES/DRESSINGS) ×3 IMPLANT
SUT VIC AB 0 CT1 18XCR BRD8 (SUTURE) ×1 IMPLANT
SUT VIC AB 0 CT1 8-18 (SUTURE) ×2
SUT VIC AB 2-0 CP2 18 (SUTURE) ×2 IMPLANT
SUT VIC AB 3-0 SH 8-18 (SUTURE) ×5 IMPLANT
SYR 20ML ECCENTRIC (SYRINGE) ×2 IMPLANT
TOWEL OR 17X24 6PK STRL BLUE (TOWEL DISPOSABLE) ×2 IMPLANT
TOWEL OR 17X26 10 PK STRL BLUE (TOWEL DISPOSABLE) ×2 IMPLANT
TRAP SPECIMEN MUCOUS 40CC (MISCELLANEOUS) IMPLANT
TRAY FOLEY CATH 14FRSI W/METER (CATHETERS) ×2 IMPLANT
WATER STERILE IRR 1000ML POUR (IV SOLUTION) ×2 IMPLANT

## 2012-02-11 NOTE — Anesthesia Postprocedure Evaluation (Signed)
  Anesthesia Post-op Note  Patient: Travis [name]  Procedure(s) Performed: Procedure(s) (LRB): POSTERIOR LUMBAR FUSION 1 LEVEL ([name])  Patient Location: PACU  Anesthesia Type: General  Level of Consciousness: awake  Airway and Oxygen Therapy: Patient Spontanous Breathing  Post-op Pain: mild  Post-op Assessment: Post-op Vital signs reviewed  Post-op Vital Signs: stable  Complications: No apparent anesthesia complications

## 2012-02-11 NOTE — H&P (Signed)
Subjective: Patient is a 66 y.o. male admitted for Thoracic fusion T7-T10. Onset of symptoms was several years ago, gradually worsening since that time.  The pain is rated severe, and is located at the thoracic region at his old surgical site and radiates to L ribs. The pain is described as aching and occurs intermittently. The symptoms have been progressive. Symptoms are exacerbated by nothing in particular. MRI or CT showed thoracic spondylosis.   Past Medical History  Diagnosis Date  . PONV (postoperative nausea and vomiting)   . Hypertension   . Heart murmur   . Sleep apnea     DX  2009/2010  . Hyperthyroidism     DX 10 YR AGO  . DJD (degenerative joint disease)     WHOLE SPINE    Past Surgical History  Procedure Date  . Back surgery     "BUNCHES"  . Cholecystectomy     Prior to Admission medications   Medication Sig Start Date End Date Taking? Authorizing Provider  cyclobenzaprine (FLEXERIL) 10 MG tablet Take 10 mg by mouth at bedtime.   Yes Historical Provider, MD  gabapentin (NEURONTIN) 600 MG tablet Take 600 mg by mouth 3 (three) times daily.   Yes Historical Provider, MD  levothyroxine (SYNTHROID, LEVOTHROID) 150 MCG tablet Take 150 mcg by mouth daily.   Yes Historical Provider, MD  lisinopril-hydrochlorothiazide (PRINZIDE,ZESTORETIC) 20-12.5 MG per tablet Take 1 tablet by mouth daily.   Yes Historical Provider, MD  metoprolol succinate (TOPROL-XL) 25 MG 24 hr tablet Take 25 mg by mouth daily.   Yes Historical Provider, MD  pramipexole (MIRAPEX) 0.125 MG tablet Take 0.25 mg by mouth daily.   Yes Historical Provider, MD   Allergies  Allergen Reactions  . Other Other (See Comments)    Anesthesia makes sick, must have antinausea medication in advance.    History  Substance Use Topics  . Smoking status: Former Smoker -- 2.0 packs/day for 20 years    Types: Cigarettes  . Smokeless tobacco: Former Systems developer    Quit date: 06/28/1985  . Alcohol Use: No    History reviewed. No  pertinent family history.   Review of Systems  Positive ROS: neg  All other systems have been reviewed and were otherwise negative with the exception of those mentioned in the HPI and as above.  Objective: Vital signs in last 24 hours: Temp:  [97.6 F (36.4 C)] 97.6 F (36.4 C) (02/13 1025) Pulse Rate:  [60] 60  (02/13 1025) Resp:  [18] 18  (02/13 1025) BP: (176)/(88) 176/88 mmHg (02/13 1025) SpO2:  [97 %] 97 % (02/13 1025)  General Appearance: Alert, cooperative, no distress, appears stated age Head: Normocephalic, without obvious abnormality, atraumatic Eyes: PERRL, conjunctiva/corneas clear, EOM's intact, fundi benign, both eyes      Ears: Normal TM's and external ear canals, both ears Throat: Lips, mucosa, and tongue normal; teeth and gums normal Neck: Supple, symmetrical, trachea midline, no adenopathy; thyroid: No enlargement/tenderness/nodules; no carotid bruit or JVD Back: Symmetric, no curvature, ROM normal, no CVA tenderness Lungs: Clear to auscultation bilaterally, respirations unlabored Heart: Regular rate and rhythm, S1 and S2 normal, no murmur, rub or gallop Abdomen: Soft, non-tender, bowel sounds active all four quadrants, no masses, no organomegaly Extremities: Extremities normal, atraumatic, no cyanosis or edema Pulses: 2+ and symmetric all extremities Skin: Skin color, texture, turgor normal, no rashes or lesions  NEUROLOGIC:   Mental status: Alert and oriented x4,  no aphasia, good attention span, fund of knowledge, and memory  Motor Exam - grossly normal Sensory Exam - grossly normal Reflexes: increased Coordination -poor with LE Gait - spastic Balance - grossly normal Cranial Nerves: I: smell Not tested  II: visual acuity  OS: nl    OD: nl  II: visual fields Full to confrontation  II: pupils Equal, round, reactive to light  III,VII: ptosis None  III,IV,VI: extraocular muscles  Full ROM  V: mastication Normal  V: facial light touch sensation  Normal   V,VII: corneal reflex  Present  VII: facial muscle function - upper  Normal  VII: facial muscle function - lower Normal  VIII: hearing Not tested  IX: soft palate elevation  Normal  IX,X: gag reflex Present  XI: trapezius strength  5/5  XI: sternocleidomastoid strength 5/5  XI: neck flexion strength  5/5  XII: tongue strength  Normal    Data Review Lab Results  Component Value Date   WBC 5.7 02/03/2012   HGB 14.0 02/03/2012   HCT 39.5 02/03/2012   MCV 89.0 02/03/2012   PLT 175 02/03/2012   Lab Results  Component Value Date   NA 141 02/03/2012   K 4.3 02/03/2012   CL 103 02/03/2012   CO2 28 02/03/2012   BUN 20 02/03/2012   CREATININE 1.03 02/03/2012   GLUCOSE 112* 02/03/2012   Lab Results  Component Value Date   INR 0.93 02/03/2012    Assessment/Plan: Patient admitted for thoracic fusion T7-T10. Patient has failed conservative therapy.  I explained the condition and procedure to the patient and answered any questions.  Patient wishes to proceed with procedure as planned. Understands risks/ benefits and typical outcomes of procedure.   Travis [name] S 02/11/2012 11:39 AM

## 2012-02-11 NOTE — Anesthesia Preprocedure Evaluation (Addendum)
Anesthesia Evaluation  Patient identified by MRN, date of birth, ID band Patient awake    Reviewed: Allergy & Precautions, H&P , NPO status , Patient's Chart, lab work & pertinent test results, reviewed documented beta blocker date and time   History of Anesthesia Complications (+) PONV  Airway Mallampati: III TM Distance: >3 FB Neck ROM: Limited    Dental  (+) Dental Advisory Given   Pulmonary sleep apnea and Continuous Positive Airway Pressure Ventilation ,  clear to auscultation        Cardiovascular Exercise Tolerance: Good hypertension, Pt. on home beta blockers + Valvular Problems/Murmurs Regular Normal    Neuro/Psych    GI/Hepatic   Endo/Other  Hyperthyroidism   Renal/GU      Musculoskeletal   Abdominal   Peds  Hematology   Anesthesia Other Findings   Reproductive/Obstetrics                          Anesthesia Physical Anesthesia Plan  ASA: III  Anesthesia Plan: General   Post-op Pain Management:    Induction: Intravenous  Airway Management Planned: Oral ETT  Additional Equipment: Arterial line  Intra-op Plan:   Post-operative Plan: Extubation in OR  Informed Consent: I have reviewed the patients History and Physical, chart, labs and discussed the procedure including the risks, benefits and alternatives for the proposed anesthesia with the patient or authorized representative who has indicated his/her understanding and acceptance.   Dental advisory given  Plan Discussed with: Anesthesiologist  Anesthesia Plan Comments:        Anesthesia Quick Evaluation

## 2012-02-11 NOTE — Transfer of Care (Signed)
Immediate Anesthesia Transfer of Care Note  Patient: Travis [name]  Procedure(s) Performed: Procedure(s) (LRB): POSTERIOR LUMBAR FUSION 1 LEVEL ([name])  Patient Location: PACU  Anesthesia Type: General  Level of Consciousness: awake  Airway & Oxygen Therapy: Patient Spontanous Breathing and Patient connected to nasal cannula oxygen  Post-op Assessment: Report given to PACU RN and Post -op Vital signs reviewed and stable  Post vital signs: stable  Complications: No apparent anesthesia complications

## 2012-02-11 NOTE — Preoperative (Signed)
Beta Blockers   Reason not to administer Beta Blockers:Not Applicable 

## 2012-02-11 NOTE — Op Note (Signed)
02/11/2012  3:23 PM  PATIENT:  Travis [name]  66 y.o. male  PRE-OPERATIVE DIAGNOSIS:  Thoracic spondylosis T7-8 T8-9 T9-10 with back pain  POST-OPERATIVE DIAGNOSIS:  Same  PROCEDURE:     1. Posterior fixation T7-T10 using Nuvasive pedicle screws.  2. Intertransverse arthrodesis T7-T10 using morcellized allograft.  SURGEON:  Sherley Bounds, MD  ASSISTANTS: Dr. Sherwood Gambler  ANESTHESIA:  General  EBL: 200 ml  Total I/O In: 1000 [I.V.:1000] Out: 550 [Urine:400; Blood:150]  BLOOD ADMINISTERED:none  DRAINS: Hemovac   INDICATION FOR PROCEDURE: Patient presented with her myelopathy as the thoracic decompression. He had severe back pain thoracic region with some radicular pain. MRI showed severe spondylosis T7-8 T9-T9 10. He try medical management without significant relief. Recommended thoracic fusion from T7-T10 and hopes of improving his pain syndrome Patient understood the risks, benefits, and alternatives and potential outcomes and wished to proceed.  PROCEDURE DETAILS:  The patient was brought to the operating room. After induction of generalized endotracheal anesthesia the patient was rolled into the prone position on chest rolls and all pressure points were padded. The patient's thoracic region was cleaned and then prepped with DuraPrep and draped in the usual sterile fashion. Anesthesia was injected and then a dorsal midline incision was made and carried down to the thoracic fascia. The fascia was opened and the paraspinous musculature was taken down in a subperiosteal fashion to expose T7-T10. Intraoperative fluoroscopy confirmed my level. We then turned our attention to the posterior fixation. The pedicle screw entry zones were identified utilizing surface landmarks and AP and lateral fluoroscopy. We probed each pedicle with the pedicle probe and tapped each pedicle with the appropriate tap. We palpated with a ball probe to assure no break in the cortex. We then placed 6 5 x 45 mm  pedicle screw into the pedicles bilaterally at T10. We placed 5 5 x 45 mm pedicle screws into the T7-T9 pedicles . This was all done with AP and lateral fluoroscopy. We then decorticated the transverse processes and lamina from T7-T10 and laid morcellized allograft out over these to perform arthrodesis at T7-T10. We then placed kyphotic rods into the multiaxial screw heads of the pedicle screws and locked these in position with the locking caps and anti-torque device. . We then checked our construct with AP and lateral fluoroscopy. Irrigated with copious amounts of bacitracin-containing saline solution. Placed a medium Hemovac drain through separate stab incision and closed the muscle and the fascia with 0 Vicryl. Closed the subcutaneous tissues with 2-0 Vicryl and subcuticular tissues with 3-0 Vicryl. The skin was closed with benzoin and Steri-Strips. Dressing was then applied, the patient was awakened from general anesthesia and transported to the recovery room in stable condition. At the end of the procedure all sponge, needle and instrument counts were correct.   PLAN OF CARE: Admit to inpatient   PATIENT DISPOSITION:  PACU - hemodynamically stable.   Delay start of Pharmacological VTE agent (>24hrs) due to surgical blood loss or risk of bleeding:  yes

## 2012-02-12 MED ORDER — MANAGING BACK PAIN BOOK
Freq: Once | Status: AC
  Administered 2012-02-12: 01:00:00
  Filled 2012-02-12: qty 1

## 2012-02-12 MED ORDER — BIOTENE DRY MOUTH MT LIQD
15.0000 mL | Freq: Two times a day (BID) | OROMUCOSAL | Status: DC
  Administered 2012-02-12: 15 mL via OROMUCOSAL

## 2012-02-12 NOTE — Discharge Summary (Signed)
Physician Discharge Summary  Patient ID: Travis [name] MRN: [id number] DOB/AGE: 1946/10/08 66 y.o.  Admit date: 02/11/2012 Discharge date: 02/12/2012  Admission Diagnoses: thoracic spondylosis    Discharge Diagnoses: same   Discharged Condition: stable  Hospital Course: The patient was admitted on 02/11/2012 and taken to the operating room where the patient underwent thoracic fusion T7-T10. The patient tolerated the procedure well and was taken to the recovery room and then to the floor in stable condition. The hospital course was routine. There were no complications. The wound remained clean dry and intact. The patient remained afebrile with stable vital signs, and tolerated a regular diet. The patient continued to increase activities, and pain was well controlled with oral pain medications.   Consults: None  Significant Diagnostic Studies:  Results for orders placed during the hospital encounter of 02/11/12  TYPE AND SCREEN      Component Value Range   ABO/RH(D) O POS     Antibody Screen NEG     Sample Expiration 02/14/2012    ABO/RH      Component Value Range   ABO/RH(D) O POS      Chest 2 View  02/03/2012  *RADIOLOGY REPORT*  Clinical Data: Thoracic fusion.  Degenerative joint disease. Hypertension.  Cardiac murmur.  Remote smoking history.  CHEST - 2 VIEW  Comparison: 05/08/2010  Findings: Elevated right hemidiaphragm noted with scarring or chronic atelectasis adjacent to the hemidiaphragm.  Thoracic spondylosis is present.  There is mild peripheral scarring in the left lower lobe.  Heart size is within normal limits.  There is tortuosity and atherosclerotic calcification of the aortic arch.  No pleural effusion is observed.  IMPRESSION:  1.  Stable elevated right hemidiaphragm noted with scarring or chronic atelectasis in the right lower lobe. 2.  Thoracic spondylosis. 3.  Minimal peripheral scarring in the left lower lobe. 4.  Atherosclerosis.  Original Report Authenticated  By: Carron Curie, M.D.   Dg Thoracic Spine 2 View  02/11/2012  *RADIOLOGY REPORT*  Clinical Data: 66 year old male undergoing spine surgery.  THORACIC SPINE - 2 VIEW  Comparison: Thoracic MRI 01/01/2012.  Fluoroscopy time of 1.8 minutes was utilized.  Findings: Intraoperative fluoroscopic views of the thoracic spine in the frontal and lateral projections.  Insufficient osseous landmarks to verify the operated levels, but transpedicular fusion hardware has been placed spanning four thoracic levels.  IMPRESSION: Four level thoracic transpedicular fusion depicted.  Original Report Authenticated By: Randall An, M.D.    Antibiotics:  Anti-infectives     Start     Dose/Rate Route Frequency Ordered Stop   02/11/12 2000   ceFAZolin (ANCEF) IVPB 1 g/50 mL premix        1 g 100 mL/hr over 30 Minutes Intravenous Every 8 hours 02/11/12 1759 02/12/12 0414   02/11/12 1312   bacitracin 50,000 Units in sodium chloride irrigation 0.9 % 500 mL irrigation  Status:  Discontinued          As needed 02/11/12 1312 02/11/12 1522   02/11/12 1151   bacitracin 50000 UNITS injection     Comments: Vanetta Shawl: cabinet override         02/11/12 1151 02/11/12 2359   02/11/12 0500   ceFAZolin (ANCEF) IVPB 2 g/50 mL premix  Status:  Discontinued        2 g 100 mL/hr over 30 Minutes Intravenous 60 min pre-op 02/10/12 1619 02/11/12 1744          Discharge Exam: Blood pressure 142/84, pulse  74, temperature 98 F (36.7 C), temperature source Oral, resp. rate 18, height 6\' 1"  (1.854 m), weight 120.203 kg (265 lb), SpO2 95.00%. stable myelpathy  Discharge Medications:   Medication List  As of 02/12/2012  4:53 PM   TAKE these medications         cyclobenzaprine 10 MG tablet   Commonly known as: FLEXERIL   Take 10 mg by mouth at bedtime.      gabapentin 600 MG tablet   Commonly known as: NEURONTIN   Take 600 mg by mouth 3 (three) times daily.      levothyroxine 150 MCG tablet   Commonly known as:  SYNTHROID, LEVOTHROID   Take 150 mcg by mouth daily.      lisinopril-hydrochlorothiazide 20-12.5 MG per tablet   Commonly known as: PRINZIDE,ZESTORETIC   Take 1 tablet by mouth daily.      metoprolol succinate 25 MG 24 hr tablet   Commonly known as: TOPROL-XL   Take 25 mg by mouth daily.      pramipexole 0.125 MG tablet   Commonly known as: MIRAPEX   Take 0.25 mg by mouth daily.            Disposition: home   Final Dx: thoracic fusion  Discharge Orders    Future Orders Please Complete By Expires   Diet - low sodium heart healthy      Increase activity slowly      Driving Restrictions      Comments:   1 week   Lifting restrictions      Comments:   Less than 8 lbs   Call MD for:  temperature >100.4      Call MD for:  persistant nausea and vomiting      Call MD for:  severe uncontrolled pain      Call MD for:  redness, tenderness, or signs of infection (pain, swelling, redness, odor or green/yellow discharge around incision site)      Call MD for:  difficulty breathing, headache or visual disturbances         Follow-up Information    Follow up with Vondra Aldredge S, MD in 2 weeks.   Contact information:   [address] [place] [phone]           Signed: Eustace Moore 02/12/2012, 4:53 PM

## 2012-02-12 NOTE — Progress Notes (Signed)
Physical Therapy Evaluation Patient Details Name: Travis [name] MRN: [id number] DOB: Oct 03, 1946 Today's Date: 02/12/2012  Problem List: There is no problem list on file for this patient.   Past Medical History:  Past Medical History  Diagnosis Date  . PONV (postoperative nausea and vomiting)   . Hypertension   . Heart murmur   . Sleep apnea     DX  2009/2010  . Hyperthyroidism     DX 10 YR AGO  . DJD (degenerative joint disease)     WHOLE SPINE   Past Surgical History:  Past Surgical History  Procedure Date  . Back surgery     "BUNCHES"  . Cholecystectomy     PT Assessment/Plan/Recommendation PT Assessment Clinical Impression Statement: Pt presents with a medical diagnosis of T7-10 fusion along with the following impairments/deficits and therapy diagnosis listed below. Pt with abnormal gait pattern which could lead to decreased safety. Pt will benefit from skilled PT in the acute care setting in order to maximize functional mobility for a safe d/c home PT Recommendation/Assessment: Patient will need skilled PT in the acute care venue PT Problem List: Decreased activity tolerance;Decreased mobility;Decreased knowledge of use of DME;Pain PT Therapy Diagnosis : Abnormality of gait;Acute pain PT Plan PT Frequency: Min 5X/week PT Treatment/Interventions: DME instruction;Gait training;Stair training;Functional mobility training;Therapeutic activities;Therapeutic exercise;Patient/family education PT Recommendation Follow Up Recommendations: Home health PT;Supervision/Assistance - 24 hour Equipment Recommended: None recommended by OT PT Goals  Acute Rehab PT Goals PT Goal Formulation: With patient Time For Goal Achievement: 7 days Pt will go Supine/Side to Sit: with modified independence PT Goal: Supine/Side to Sit - Progress: Goal set today Pt will go Sit to Supine/Side: with modified independence PT Goal: Sit to Supine/Side - Progress: Goal set today Pt will go Sit to  Stand: with modified independence PT Goal: Sit to Stand - Progress: Goal set today Pt will go Stand to Sit: with modified independence PT Goal: Stand to Sit - Progress: Goal set today Pt will Transfer Bed to Chair/Chair to Bed: with modified independence PT Transfer Goal: Bed to Chair/Chair to Bed - Progress: Goal set today Pt will Ambulate: >150 feet;with modified independence;with least restrictive assistive device PT Goal: Ambulate - Progress: Goal set today Pt will Go Up / Down Stairs: 1-2 stairs;with rail(s);with supervision PT Goal: Up/Down Stairs - Progress: Goal set today  PT Evaluation Precautions/Restrictions  Precautions Precautions: Back Required Braces or Orthoses: Yes Spinal Brace: Lumbar corset Restrictions Weight Bearing Restrictions: No Prior Functioning  Home Living Lives With: Spouse Receives Help From: Family Type of Home: House Home Layout: One level Home Access: Stairs to enter Entrance Stairs-Rails: Right Entrance Stairs-Number of Steps: 2 Bathroom Shower/Tub: Gaffer;Door Bathroom Toilet: Handicapped height (pt has a handicapped height toilet as well as standard heigh) Bathroom Accessibility: Yes How Accessible: Accessible via walker Home Adaptive Equipment: Walker - rolling;Straight cane Prior Function Level of Independence: Independent with basic ADLs;Independent with homemaking with ambulation;Independent with gait;Independent with transfers (difficulty with socks the last week) Able to Take Stairs?: Yes Driving: Yes Vocation: Full time employment Vocation Requirements: [place]. Leisure: Hobbies-no Comments: Pt states that prior to surgery he was able to cross bil. LE to perform LB ADL tasks, Cognition Cognition Arousal/Alertness: Awake/alert Overall Cognitive Status: Appears within functional limits for tasks assessed Orientation Level: Oriented X4 Sensation/Coordination Sensation Light Touch:  Appears Intact Coordination Gross Motor Movements are Fluid and Coordinated: Yes (bil. UE) Fine Motor Movements are Fluid and Coordinated: Yes (bil.  UE) Extremity Assessment RUE Assessment RUE Assessment: Within Functional Limits LUE Assessment LUE Assessment: Within Functional Limits RLE Assessment RLE Assessment: Within Functional Limits LLE Assessment LLE Assessment: Within Functional Limits Mobility (including Balance) Bed Mobility Bed Mobility: No Transfers Transfers: Yes Sit to Stand: 5: Supervision;With upper extremity assist;From chair/3-in-1 Sit to Stand Details (indicate cue type and reason): VC for hand placement Stand to Sit: 5: Supervision;With upper extremity assist;To chair/3-in-1 Stand to Sit Details: VC for hand placement Ambulation/Gait Ambulation/Gait: Yes Ambulation/Gait Assistance: 4: Min assist (Minguard assist) Ambulation/Gait Assistance Details (indicate cue type and reason): VC for safety with distance to RW as well as cues to increase heel/toe gait pattern and increase knee flexion Ambulation Distance (Feet): 200 Feet Assistive device: Rolling walker Gait Pattern: Step-to pattern;Decreased stride length;Decreased hip/knee flexion - right;Decreased hip/knee flexion - left;Decreased dorsiflexion - right;Decreased dorsiflexion - left;Decreased trunk rotation Gait velocity: Decreased gait speed Stairs: No    Exercise    End of Session PT - End of Session Equipment Utilized During Treatment: Gait belt;Back brace Activity Tolerance: Patient tolerated treatment well Patient left: in chair;with call bell in reach;with family/visitor present Nurse Communication: Mobility status for transfers;Mobility status for ambulation General Behavior During Session: [facility] for tasks performed Cognition: [facility] for tasks performed  Ambrose Finland 02/12/2012, 11:07 AM  02/12/2012 Ambrose Finland DPT PAGER: [phone] OFFICE: [phone]

## 2012-02-12 NOTE — Progress Notes (Signed)
CARE MANAGEMENT NOTE 02/12/2012 Action/Plan:   Discharge planning. Received order for CM to arrange for DME. Spoke to pt and wife, states he has a rolling walker and doesnt need a 3in1, has a grab bar near toilet. No further needs.   Anticipated DC Date:  02/13/2012   Anticipated DC Plan:  [place] CM consult [place] arranged NA Status of service:  Completed, signed off

## 2012-02-12 NOTE — Progress Notes (Signed)
Utilization review completed. Rozanna Boer, RN, BSN. 02/12/12

## 2012-02-12 NOTE — Progress Notes (Signed)
Occupational Therapy Evaluation Patient Details Name: Travis [name] MRN: [id number] DOB: [birth year] Today's Date: 02/12/2012  Problem List: There is no problem list on file for this patient.   Past Medical History:  Past Medical History  Diagnosis Date  . PONV (postoperative nausea and vomiting)   . Hypertension   . Heart murmur   . Sleep apnea     DX  2009/2010  . Hyperthyroidism     DX 10 YR AGO  . DJD (degenerative joint disease)     WHOLE SPINE   Past Surgical History:  Past Surgical History  Procedure Date  . Back surgery     "BUNCHES"  . Cholecystectomy     OT Assessment/Plan/Recommendation OT Assessment Clinical Impression Statement: Pt s/p posterior lumbar fusion.  Pt demonstrating functional transfers with supervision for safety.  Requiring min assist with LB ADLs but pt's wife reports that she will be able to assist as necessary upon d/c home.  All education completed and pt has no DME needs.  Pt will have necessary level of assist upon d/c home.  No further acute OT needs. OT Recommendation/Assessment: Patient does not need any further OT services OT Recommendation Follow Up Recommendations: Supervision/Assistance - 24 hour Equipment Recommended: None recommended by OT OT Goals    OT Evaluation Precautions/Restrictions  Precautions Precautions: Back Required Braces or Orthoses: Yes Spinal Brace: Lumbar corset Restrictions Weight Bearing Restrictions: No Prior Functioning Home Living Lives With: Spouse Receives Help From: Family Type of Home: House Home Layout: One level Home Access: Stairs to enter Entrance Stairs-Rails: Right Entrance Stairs-Number of Steps: 2 Bathroom Shower/Tub: Gaffer;Door Bathroom Toilet: Handicapped height (pt has a handicapped height toilet as well as standard heigh) Bathroom Accessibility: Yes How Accessible: Accessible via walker Home Adaptive Equipment: Walker - rolling;Straight cane Prior Function Level of  Independence: Independent with basic ADLs;Independent with homemaking with ambulation;Independent with gait;Independent with transfers (difficulty with socks the last week) Able to Take Stairs?: Yes Driving: Yes Vocation: Full time employment Vocation Requirements: [place]. Leisure: Hobbies-no Comments: Pt states that prior to surgery he was able to cross bil. LE to perform LB ADL tasks, ADL ADL Lower Body Bathing: Simulated;Minimal assistance Lower Body Bathing Details (indicate cue type and reason): Pt able to cross bil. LE but experiences some discomfort upon doing so. Pt reports that bil. LE "just feel a little stiff today."  Where Assessed - Lower Body Bathing: Sit to stand from chair Lower Body Dressing: Simulated;Minimal assistance Lower Body Dressing Details (indicate cue type and reason): Pt able to cross bil. LE but experiences some discomfort upon doing so. Pt reports that bil. LE "just feel a little stiff today."  Where Assessed - Lower Body Dressing: Sit to stand from chair Toilet Transfer: Simulated;Supervision/safety Toilet Transfer Details (indicate cue type and reason): Supervision for safety. VC to reach back for armrests on chair before beginning descent. Toilet Transfer Method: Ambulating Equipment Used: Rolling walker Ambulation Related to ADLs: Pt ambulated throughout room with supervision for safety and VC for sequencing correct gait sequence. ADL Comments: Pt and wife educated on incorporating back precautions into ADL activity.   Vision/Perception    Cognition Cognition Arousal/Alertness: Awake/alert Overall Cognitive Status: Appears within functional limits for tasks assessed Orientation Level: Oriented X4 Sensation/Coordination Sensation Light Touch: Appears Intact Coordination Gross Motor Movements are Fluid and Coordinated: Yes (bil. UE) Fine Motor Movements are Fluid and Coordinated: Yes (bil. UE) Extremity  Assessment RUE Assessment RUE Assessment: Within Functional Limits  LUE Assessment LUE Assessment: Within Functional Limits Mobility  Bed Mobility Bed Mobility: No Transfers Transfers: Yes Sit to Stand: 5: Supervision;With upper extremity assist;From chair/3-in-1 Sit to Stand Details (indicate cue type and reason): VC for hand placement Stand to Sit: 5: Supervision;With upper extremity assist;To chair/3-in-1 Stand to Sit Details: VC for hand placement Exercises   End of Session OT - End of Session Equipment Utilized During Treatment: Gait belt;Back brace Activity Tolerance: Patient tolerated treatment well Patient left: in chair;with call bell in reach;with family/visitor present General Behavior During Session: [facility] for tasks performed Cognition: [facility] for tasks performed   Darrol Jump 02/12/2012, 11:06 AM  02/12/2012 Darrol Jump OTR/L Pager [phone] Office [phone]

## 2012-02-13 MED FILL — Sodium Chloride Irrigation Soln 0.9%: Qty: 3000 | Status: AC

## 2012-02-13 MED FILL — Heparin Sodium (Porcine) Inj 1000 Unit/ML: INTRAMUSCULAR | Qty: 30 | Status: AC

## 2012-02-13 MED FILL — Sodium Chloride IV Soln 0.9%: INTRAVENOUS | Qty: 1000 | Status: AC

## 2012-05-26 ENCOUNTER — Other Ambulatory Visit: Payer: Self-pay | Admitting: Neurological Surgery

## 2012-05-26 DIAGNOSIS — M549 Dorsalgia, unspecified: Secondary | ICD-10-CM

## 2012-05-27 ENCOUNTER — Ambulatory Visit
Admission: RE | Admit: 2012-05-27 | Discharge: 2012-05-27 | Disposition: A | Discharge status: Home / Self Care | Payer: Medicare Other | Source: Ambulatory Visit | Attending: Neurological Surgery | Admitting: Neurological Surgery

## 2012-05-27 VITALS — BP 145/74 | HR 61

## 2012-05-27 DIAGNOSIS — M549 Dorsalgia, unspecified: Secondary | ICD-10-CM

## 2012-05-27 MED ORDER — DIAZEPAM 5 MG PO TABS
5.0000 mg | ORAL_TABLET | Freq: Once | ORAL | Status: AC
  Administered 2012-05-27: 5 mg via ORAL

## 2012-05-27 MED ORDER — IOHEXOL 300 MG/ML  SOLN
10.0000 mL | Freq: Once | INTRAMUSCULAR | Status: AC | PRN
  Administered 2012-05-27: 10 mL via INTRATHECAL

## 2012-05-27 NOTE — Discharge Instructions (Signed)

## 2012-06-01 ENCOUNTER — Other Ambulatory Visit: Payer: Self-pay | Admitting: Neurological Surgery

## 2012-06-01 DIAGNOSIS — M549 Dorsalgia, unspecified: Secondary | ICD-10-CM

## 2012-06-25 ENCOUNTER — Other Ambulatory Visit: Payer: Self-pay | Admitting: Neurological Surgery

## 2012-06-25 DIAGNOSIS — M546 Pain in thoracic spine: Secondary | ICD-10-CM

## 2012-06-25 DIAGNOSIS — M48061 Spinal stenosis, lumbar region without neurogenic claudication: Secondary | ICD-10-CM

## 2012-06-29 ENCOUNTER — Other Ambulatory Visit: Payer: Medicare Other

## 2012-07-05 ENCOUNTER — Other Ambulatory Visit: Payer: Medicare Other

## 2012-07-05 ENCOUNTER — Ambulatory Visit
Admission: RE | Admit: 2012-07-05 | Discharge: 2012-07-05 | Disposition: A | Discharge status: Home / Self Care | Payer: Medicare Other | Source: Ambulatory Visit | Attending: Neurological Surgery | Admitting: Neurological Surgery

## 2012-07-05 DIAGNOSIS — M546 Pain in thoracic spine: Secondary | ICD-10-CM

## 2012-07-05 DIAGNOSIS — M48061 Spinal stenosis, lumbar region without neurogenic claudication: Secondary | ICD-10-CM

## 2012-07-05 MED ORDER — GADOBENATE DIMEGLUMINE 529 MG/ML IV SOLN
20.0000 mL | Freq: Once | INTRAVENOUS | Status: AC | PRN
  Administered 2012-07-05: 20 mL via INTRAVENOUS

## 2012-07-13 ENCOUNTER — Encounter (HOSPITAL_COMMUNITY): Payer: Self-pay | Admitting: Pharmacy Technician

## 2012-07-13 ENCOUNTER — Encounter (HOSPITAL_COMMUNITY): Payer: Self-pay

## 2012-07-13 ENCOUNTER — Encounter (HOSPITAL_COMMUNITY)
Admission: RE | Admit: 2012-07-13 | Discharge: 2012-07-13 | Disposition: A | Discharge status: Home / Self Care | Payer: Medicare Other | Source: Ambulatory Visit | Attending: Neurological Surgery | Admitting: Neurological Surgery

## 2012-07-13 ENCOUNTER — Other Ambulatory Visit: Payer: Self-pay | Admitting: Neurological Surgery

## 2012-07-13 HISTORY — DX: Hypothyroidism, unspecified: E03.9

## 2012-07-13 HISTORY — DX: Type 2 diabetes mellitus without complications: E11.9

## 2012-07-13 LAB — CBC
HCT: 38.7 % — ABNORMAL LOW (ref 39.0–52.0)
MCHC: 33.3 g/dL (ref 30.0–36.0)
MCV: 88.8 fL (ref 78.0–100.0)
Platelets: 148 10*3/uL — ABNORMAL LOW (ref 150–400)
RDW: 14.3 % (ref 11.5–15.5)
WBC: 4.9 10*3/uL (ref 4.0–10.5)

## 2012-07-13 LAB — BASIC METABOLIC PANEL
BUN: 22 mg/dL (ref 6–23)
Chloride: 104 mEq/L (ref 96–112)
Creatinine, Ser: 1.08 mg/dL (ref 0.50–1.35)
GFR calc Af Amer: 80 mL/min — ABNORMAL LOW (ref >90)
GFR calc non Af Amer: 69 mL/min — ABNORMAL LOW (ref >90)

## 2012-07-13 LAB — SURGICAL PCR SCREEN: Staphylococcus aureus: POSITIVE — AB

## 2012-07-13 NOTE — Pre-Procedure Instructions (Addendum)
[place]  07/13/2012   Your procedure is scheduled on:  June 17  Report to [place] at 1:25 PM.  Call this number if you have problems the morning of surgery: [phone]   Remember:   Do not eat or drink:After Midnight.  Clear liquids include soda, tea, black coffee, apple or grape juice, broth.  Take these medicines the morning of surgery with A SIP OF WATER: Synthroid, Neurontin   Do not wear jewelry, make-up or nail polish.  Do not wear lotions, powders, or perfumes. You may wear deodorant.  Do not shave 48 hours prior to surgery. Men may shave face and neck.  Do not bring valuables to the hospital.  Contacts, dentures or bridgework may not be worn into surgery.  Leave suitcase in the car. After surgery it may be brought to your room.  For patients admitted to the hospital, checkout time is 11:00 AM the day of discharge.   Special Instructions: CHG Shower Use Special Wash: 1/2 bottle night before surgery and 1/2 bottle morning of surgery.   Please read over the following fact sheets that you were given: Pain Booklet, Coughing and Deep Breathing and Surgical Site Infection Prevention

## 2012-07-13 NOTE — Progress Notes (Signed)
Spoke with Lorriane Shire at Dr. Ronnald Ramp office and requested that he sign orders ASAP for preadmission

## 2012-07-14 ENCOUNTER — Inpatient Hospital Stay (HOSPITAL_COMMUNITY): Payer: Medicare Other

## 2012-07-14 ENCOUNTER — Encounter (HOSPITAL_COMMUNITY): Payer: Self-pay | Admitting: Anesthesiology

## 2012-07-14 ENCOUNTER — Inpatient Hospital Stay (HOSPITAL_COMMUNITY): Payer: Medicare Other | Admitting: Anesthesiology

## 2012-07-14 ENCOUNTER — Encounter (HOSPITAL_COMMUNITY)
Admission: RE | Disposition: A | Discharge status: Home / Self Care | Payer: Self-pay | Source: Ambulatory Visit | Attending: Neurological Surgery

## 2012-07-14 ENCOUNTER — Encounter (HOSPITAL_COMMUNITY): Payer: Self-pay | Admitting: *Deleted

## 2012-07-14 ENCOUNTER — Inpatient Hospital Stay (HOSPITAL_COMMUNITY)
Admission: RE | Admit: 2012-07-14 | Discharge: 2012-07-15 | DRG: 491 | Disposition: A | Discharge status: Home / Self Care | Payer: Medicare Other | Source: Ambulatory Visit | Attending: Neurological Surgery | Admitting: Neurological Surgery

## 2012-07-14 ENCOUNTER — Encounter (HOSPITAL_COMMUNITY): Payer: Self-pay | Admitting: Neurological Surgery

## 2012-07-14 DIAGNOSIS — M479 Spondylosis, unspecified: Secondary | ICD-10-CM | POA: Diagnosis present

## 2012-07-14 DIAGNOSIS — Z79899 Other long term (current) drug therapy: Secondary | ICD-10-CM

## 2012-07-14 DIAGNOSIS — G4730 Sleep apnea, unspecified: Secondary | ICD-10-CM | POA: Diagnosis present

## 2012-07-14 DIAGNOSIS — M48061 Spinal stenosis, lumbar region without neurogenic claudication: Secondary | ICD-10-CM | POA: Diagnosis present

## 2012-07-14 DIAGNOSIS — I10 Essential (primary) hypertension: Secondary | ICD-10-CM | POA: Diagnosis present

## 2012-07-14 DIAGNOSIS — Z01812 Encounter for preprocedural laboratory examination: Secondary | ICD-10-CM

## 2012-07-14 DIAGNOSIS — Z87891 Personal history of nicotine dependence: Secondary | ICD-10-CM

## 2012-07-14 DIAGNOSIS — M5124 Other intervertebral disc displacement, thoracic region: Principal | ICD-10-CM | POA: Diagnosis present

## 2012-07-14 DIAGNOSIS — E119 Type 2 diabetes mellitus without complications: Secondary | ICD-10-CM | POA: Diagnosis present

## 2012-07-14 DIAGNOSIS — Z9889 Other specified postprocedural states: Secondary | ICD-10-CM

## 2012-07-14 DIAGNOSIS — E018 Other iodine-deficiency related thyroid disorders and allied conditions: Secondary | ICD-10-CM | POA: Diagnosis present

## 2012-07-14 HISTORY — PX: LUMBAR LAMINECTOMY/DECOMPRESSION MICRODISCECTOMY: SHX5026

## 2012-07-14 LAB — DIFFERENTIAL
Basophils Absolute: 0 10*3/uL (ref 0.0–0.1)
Basophils Relative: 0 % (ref 0–1)
Eosinophils Absolute: 0.1 10*3/uL (ref 0.0–0.7)
Eosinophils Relative: 3 % (ref 0–5)
Monocytes Absolute: 0.3 10*3/uL (ref 0.1–1.0)
Monocytes Relative: 8 % (ref 3–12)

## 2012-07-14 LAB — POCT I-STAT 4, (NA,K, GLUC, HGB,HCT)
Hemoglobin: 12.6 g/dL — ABNORMAL LOW (ref 13.0–17.0)
Potassium: 3.8 mEq/L (ref 3.5–5.1)
Sodium: 143 mEq/L (ref 135–145)

## 2012-07-14 LAB — PROTIME-INR: Prothrombin Time: 13.7 seconds (ref 11.6–15.2)

## 2012-07-14 LAB — GLUCOSE, CAPILLARY: Glucose-Capillary: 168 mg/dL — ABNORMAL HIGH (ref 70–99)

## 2012-07-14 SURGERY — LUMBAR LAMINECTOMY/DECOMPRESSION MICRODISCECTOMY 2 LEVELS
Anesthesia: General | Site: Back | Laterality: Left | Wound class: Clean

## 2012-07-14 MED ORDER — ONDANSETRON HCL 4 MG/2ML IJ SOLN
INTRAMUSCULAR | Status: DC | PRN
  Administered 2012-07-14 (×2): 4 mg via INTRAVENOUS

## 2012-07-14 MED ORDER — 0.9 % SODIUM CHLORIDE (POUR BTL) OPTIME
TOPICAL | Status: DC | PRN
  Administered 2012-07-14: 1000 mL

## 2012-07-14 MED ORDER — DEXTROSE 5 % IV SOLN: 1.0000 g | INTRAVENOUS | Status: DC

## 2012-07-14 MED ORDER — PROPOFOL 10 MG/ML IV BOLUS
INTRAVENOUS | Status: DC | PRN
  Administered 2012-07-14: 170 mg via INTRAVENOUS
  Administered 2012-07-14: 30 mg via INTRAVENOUS

## 2012-07-14 MED ORDER — OXYCODONE-ACETAMINOPHEN 5-325 MG PO TABS
1.0000 | ORAL_TABLET | ORAL | Status: DC | PRN
  Administered 2012-07-14: 2 via ORAL

## 2012-07-14 MED ORDER — HETASTARCH-ELECTROLYTES 6 % IV SOLN
INTRAVENOUS | Status: DC | PRN
  Administered 2012-07-14 (×2): via INTRAVENOUS

## 2012-07-14 MED ORDER — SODIUM CHLORIDE 0.9 % IR SOLN
Status: DC | PRN
  Administered 2012-07-14: 18:00:00

## 2012-07-14 MED ORDER — THROMBIN 5000 UNITS EX SOLR
CUTANEOUS | Status: DC | PRN
  Administered 2012-07-14 (×3): 5000 [IU] via TOPICAL

## 2012-07-14 MED ORDER — SODIUM CHLORIDE 0.9 % IV SOLN
INTRAVENOUS | Status: AC
  Filled 2012-07-14: qty 500

## 2012-07-14 MED ORDER — BUPIVACAINE HCL (PF) 0.25 % IJ SOLN
INTRAMUSCULAR | Status: DC | PRN
  Administered 2012-07-14: 1 mL

## 2012-07-14 MED ORDER — DEXTROSE 5 % IV SOLN
3.0000 g | INTRAVENOUS | Status: AC
  Administered 2012-07-14: 3 g via INTRAVENOUS
  Filled 2012-07-14: qty 30

## 2012-07-14 MED ORDER — NEOSTIGMINE METHYLSULFATE 1 MG/ML IJ SOLN
INTRAMUSCULAR | Status: DC | PRN
  Administered 2012-07-14: 5 mg via INTRAVENOUS

## 2012-07-14 MED ORDER — HYDROMORPHONE HCL PF 1 MG/ML IJ SOLN
0.2500 mg | INTRAMUSCULAR | Status: DC | PRN
  Administered 2012-07-14 (×3): 0.25 mg via INTRAVENOUS
  Administered 2012-07-14: 0.5 mg via INTRAVENOUS

## 2012-07-14 MED ORDER — DROPERIDOL 2.5 MG/ML IJ SOLN
INTRAMUSCULAR | Status: DC | PRN
  Administered 2012-07-14: 0.625 mg via INTRAVENOUS

## 2012-07-14 MED ORDER — DEXAMETHASONE SODIUM PHOSPHATE 10 MG/ML IJ SOLN
INTRAMUSCULAR | Status: DC | PRN
  Administered 2012-07-14: 10 mg via INTRAVENOUS

## 2012-07-14 MED ORDER — OXYCODONE-ACETAMINOPHEN 5-325 MG PO TABS
ORAL_TABLET | ORAL | Status: AC
  Filled 2012-07-14: qty 2

## 2012-07-14 MED ORDER — HYDROMORPHONE HCL PF 1 MG/ML IJ SOLN
INTRAMUSCULAR | Status: AC
  Filled 2012-07-14: qty 1

## 2012-07-14 MED ORDER — BACITRACIN 50000 UNITS IM SOLR
INTRAMUSCULAR | Status: AC
  Filled 2012-07-14: qty 1

## 2012-07-14 MED ORDER — GLYCOPYRROLATE 0.2 MG/ML IJ SOLN
INTRAMUSCULAR | Status: DC | PRN
  Administered 2012-07-14: 0.6 mg via INTRAVENOUS

## 2012-07-14 MED ORDER — DEXAMETHASONE SODIUM PHOSPHATE 10 MG/ML IJ SOLN: 10.0000 mg | INTRAMUSCULAR | Status: DC

## 2012-07-14 MED ORDER — DEXAMETHASONE SODIUM PHOSPHATE 10 MG/ML IJ SOLN
INTRAMUSCULAR | Status: AC
  Filled 2012-07-14: qty 1

## 2012-07-14 MED ORDER — HEMOSTATIC AGENTS (NO CHARGE) OPTIME
TOPICAL | Status: DC | PRN
  Administered 2012-07-14 (×2): 1 via TOPICAL

## 2012-07-14 MED ORDER — FENTANYL CITRATE 0.05 MG/ML IJ SOLN
INTRAMUSCULAR | Status: DC | PRN
  Administered 2012-07-14: 100 ug via INTRAVENOUS
  Administered 2012-07-14 (×2): 50 ug via INTRAVENOUS
  Administered 2012-07-14 (×3): 100 ug via INTRAVENOUS

## 2012-07-14 MED ORDER — MIDAZOLAM HCL 5 MG/5ML IJ SOLN
INTRAMUSCULAR | Status: DC | PRN
  Administered 2012-07-14: 2 mg via INTRAVENOUS

## 2012-07-14 MED ORDER — LACTATED RINGERS IV SOLN
INTRAVENOUS | Status: DC | PRN
  Administered 2012-07-14 (×2): via INTRAVENOUS

## 2012-07-14 MED ORDER — LIDOCAINE HCL (CARDIAC) 20 MG/ML IV SOLN
INTRAVENOUS | Status: DC | PRN
  Administered 2012-07-14: 100 mg via INTRAVENOUS

## 2012-07-14 MED ORDER — EPHEDRINE SULFATE 50 MG/ML IJ SOLN
INTRAMUSCULAR | Status: DC | PRN
  Administered 2012-07-14 (×3): 10 mg via INTRAVENOUS

## 2012-07-14 MED ORDER — ROCURONIUM BROMIDE 100 MG/10ML IV SOLN
INTRAVENOUS | Status: DC | PRN
  Administered 2012-07-14: 20 mg via INTRAVENOUS
  Administered 2012-07-14: 50 mg via INTRAVENOUS
  Administered 2012-07-14: 20 mg via INTRAVENOUS
  Administered 2012-07-14: 10 mg via INTRAVENOUS

## 2012-07-14 MED ORDER — ALBUTEROL SULFATE (5 MG/ML) 0.5% IN NEBU
INHALATION_SOLUTION | RESPIRATORY_TRACT | Status: AC
  Filled 2012-07-14: qty 0.5

## 2012-07-14 MED ORDER — MEPERIDINE HCL 25 MG/ML IJ SOLN: 6.2500 mg | INTRAMUSCULAR | Status: DC | PRN

## 2012-07-14 SURGICAL SUPPLY — 52 items
APL SKNCLS STERI-STRIP NONHPOA (GAUZE/BANDAGES/DRESSINGS) ×1
BAG DECANTER FOR FLEXI CONT (MISCELLANEOUS) ×2 IMPLANT
BENZOIN TINCTURE PRP APPL 2/3 (GAUZE/BANDAGES/DRESSINGS) ×2 IMPLANT
BUR MATCHSTICK NEURO 3.0 LAGG (BURR) ×3 IMPLANT
CANISTER SUCTION 2500CC (MISCELLANEOUS) ×2 IMPLANT
CLOTH BEACON ORANGE TIMEOUT ST (SAFETY) ×2 IMPLANT
CONT SPEC 4OZ CLIKSEAL STRL BL (MISCELLANEOUS) ×2 IMPLANT
DRAPE LAPAROTOMY 100X72X124 (DRAPES) ×2 IMPLANT
DRAPE MICROSCOPE LEICA (MISCELLANEOUS) ×1 IMPLANT
DRAPE MICROSCOPE ZEISS OPMI (DRAPES) IMPLANT
DRAPE POUCH INSTRU U-SHP 10X18 (DRAPES) ×2 IMPLANT
DRAPE PROXIMA HALF (DRAPES) ×1 IMPLANT
DRAPE SURG 17X23 STRL (DRAPES) ×2 IMPLANT
DRESSING TELFA 8X3 (GAUZE/BANDAGES/DRESSINGS) ×2 IMPLANT
DRSG OPSITE 4X5.5 SM (GAUZE/BANDAGES/DRESSINGS) ×4 IMPLANT
DURAPREP 26ML APPLICATOR (WOUND CARE) ×2 IMPLANT
ELECT REM PT RETURN 9FT ADLT (ELECTROSURGICAL) ×2
ELECTRODE REM PT RTRN 9FT ADLT (ELECTROSURGICAL) ×1 IMPLANT
GAUZE SPONGE 4X4 16PLY XRAY LF (GAUZE/BANDAGES/DRESSINGS) ×1 IMPLANT
GLOVE BIO SURGEON STRL SZ 6.5 (GLOVE) ×5 IMPLANT
GLOVE BIO SURGEON STRL SZ8 (GLOVE) ×3 IMPLANT
GLOVE BIOGEL PI IND STRL 7.0 (GLOVE) IMPLANT
GLOVE BIOGEL PI IND STRL 7.5 (GLOVE) IMPLANT
GLOVE BIOGEL PI INDICATOR 7.0 (GLOVE) ×2
GLOVE BIOGEL PI INDICATOR 7.5 (GLOVE) ×1
GLOVE ECLIPSE 7.5 STRL STRAW (GLOVE) ×1 IMPLANT
GLOVE INDICATOR 8.5 STRL (GLOVE) ×1 IMPLANT
GOWN BRE IMP SLV AUR LG STRL (GOWN DISPOSABLE) ×2 IMPLANT
GOWN BRE IMP SLV AUR XL STRL (GOWN DISPOSABLE) ×4 IMPLANT
GOWN STRL REIN 2XL LVL4 (GOWN DISPOSABLE) IMPLANT
HEMOSTAT POWDER KIT SURGIFOAM (HEMOSTASIS) IMPLANT
KIT BASIN OR (CUSTOM PROCEDURE TRAY) ×2 IMPLANT
KIT ROOM TURNOVER OR (KITS) ×2 IMPLANT
NDL HYPO 25X1 1.5 SAFETY (NEEDLE) ×1 IMPLANT
NDL SPNL 20GX3.5 QUINCKE YW (NEEDLE) IMPLANT
NEEDLE HYPO 25X1 1.5 SAFETY (NEEDLE) ×2 IMPLANT
NEEDLE SPNL 20GX3.5 QUINCKE YW (NEEDLE) IMPLANT
NS IRRIG 1000ML POUR BTL (IV SOLUTION) ×2 IMPLANT
PACK LAMINECTOMY NEURO (CUSTOM PROCEDURE TRAY) ×2 IMPLANT
PAD ARMBOARD 7.5X6 YLW CONV (MISCELLANEOUS) ×6 IMPLANT
PIN MAYFIELD SKULL DISP (PIN) ×1 IMPLANT
RUBBERBAND STERILE (MISCELLANEOUS) ×4 IMPLANT
SPONGE SURGIFOAM ABS GEL SZ50 (HEMOSTASIS) ×2 IMPLANT
STRIP CLOSURE SKIN 1/2X4 (GAUZE/BANDAGES/DRESSINGS) ×3 IMPLANT
SUT VIC AB 0 CT1 18XCR BRD8 (SUTURE) ×1 IMPLANT
SUT VIC AB 0 CT1 8-18 (SUTURE) ×4
SUT VIC AB 2-0 CP2 18 (SUTURE) ×3 IMPLANT
SUT VIC AB 3-0 SH 8-18 (SUTURE) ×3 IMPLANT
SYR 20ML ECCENTRIC (SYRINGE) ×2 IMPLANT
TOWEL OR 17X24 6PK STRL BLUE (TOWEL DISPOSABLE) ×2 IMPLANT
TOWEL OR 17X26 10 PK STRL BLUE (TOWEL DISPOSABLE) ×2 IMPLANT
WATER STERILE IRR 1000ML POUR (IV SOLUTION) ×2 IMPLANT

## 2012-07-14 NOTE — OR Nursing (Signed)
Uses cpap at home/ spoke with dr hodierne and plan is to initiate that now in pacu/setting obtained from family member---> rt notified of same

## 2012-07-14 NOTE — Progress Notes (Signed)
Bladder scan done and 56 mls scanned x 2.  Pt. States that he doesn't feel like he needs to urinate.

## 2012-07-14 NOTE — OR Nursing (Signed)
Placed on cpap/ fio2 .5 cpcap 10   sats 95

## 2012-07-14 NOTE — Anesthesia Postprocedure Evaluation (Signed)
Anesthesia Post Note  Patient: Travis [name]  Procedure(s) Performed: Procedure(s) (LRB): LUMBAR LAMINECTOMY/DECOMPRESSION MICRODISCECTOMY 2 LEVELS (Left)  Anesthesia type: General  Patient location: PACU  Post pain: Pain level controlled and Adequate analgesia  Post assessment: Post-op Vital signs reviewed, Patient's Cardiovascular Status Stable, Respiratory Function Stable, Patent Airway and Pain level controlled  Last Vitals:  Filed Vitals:   07/14/12 2236  BP:   Pulse: 78  Temp:   Resp: 20    Post vital signs: Reviewed and stable  Level of consciousness: awake, alert  and oriented  Complications: No apparent anesthesia complications

## 2012-07-14 NOTE — Progress Notes (Signed)
Dr. Sherwood Gambler notified of pts FIO2 requirements post op.  Pt. Requiring 50% Fio2 and desaturates quickly when not on CPAP to 80s.  Order received for [facility] and upgrade in level of care.

## 2012-07-14 NOTE — OR Nursing (Signed)
Thoracic diskectomy completed at 1833, Lumbar laminectomy incision at [place]

## 2012-07-14 NOTE — Op Note (Signed)
07/14/2012  7:33 PM  PATIENT:  Travis [name]  66 y.o. male  PRE-OPERATIVE DIAGNOSIS:  1. Left T45 thoracic disc herniation, 2. 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 Left T45 transpedicular decompression and microdiscectomy utilizing microscopic dissection, 2. Decompressive lumbar laminectomy medial facetectomy and foraminotomies at L2-3 bilaterally  SURGEON:  Sherley Bounds, MD  ASSISTANTS: cram  ANESTHESIA:   General  EBL: 500 ml     BLOOD ADMINISTERED:none  DRAINS: None   SPECIMEN:  No Specimen  INDICATION FOR PROCEDURE: This is a very complex patient with a history of thoracic and cervical myelopathy who presented with continued gait difficulties that seem to be progressive. CT myelogram and thoracic MRI lumbar MRI suggested progressive stenosis L2-3 and a T4-5 disc herniation that had gotten larger. He had some stenosis at other places this seemed to be stable on imaging. I recommended decompressive laminectomy L2-3 and a transpedicular discectomy at T4-5 in hopes of improving his symptoms.  Patient understood the risks, benefits, and alternatives and potential outcomes and wished to proceed.  PROCEDURE DETAILS: The patient was taken to the operating room and after induction of adequate generalized endotracheal anesthesia, the patient's head was affixed a 3 point [place] head rest, he was rolled into the prone position on chest rolls and all pressure points were padded. The lumbar region and thoracic region was cleaned and then prepped with DuraPrep and draped in the usual sterile fashion. 5 cc of local anesthesia was injected and then a dorsal midline incision was made over T4-5 and carried down to the rest fascia. The fascia was opened and the paraspinous musculature was taken down in a subperiosteal fashion to expose T45 on the left. Intraoperative x-ray confirmed my level, and then I used a combination of the high-speed drill and the Kerrison  punches to perform a hemilaminectomy, medial facetectomy, and foraminotomy at T45 on the left. The underlying yellow ligament was opened and removed in a piecemeal fashion to expose the underlying dura and exiting nerve root. I undercut the lateral recess and dissected down until I was medial to and distal to the pedicle. I used the high-speed drill to drill down the pedicle and drill into the vertebral body and disc space to create space to remove the herniated disc. I used curettes and nerve hooks to pull down disc from the midline and then remove it with a micropituitary from the disc space. We continued this until we felt like we had a good decompression in the midline. We palpated with I nerve hook into the midline and felt no more compression of the dura from below. I performed a thorough intradiscal discectomy with pituitary rongeurs and curettes, until I had a nice decompression of the nerve root and the midline. I then palpated with nerve hook along the nerve root and into the foramen to assure adequate decompression.. I irrigated with saline solution containing bacitracin. Achieved hemostasis with bipolar cautery, lined the dura with Gelfoam, and then closed the fascia with 0 Vicryl. I closed the subcutaneous tissues with 2-0 Vicryl and the subcuticular tissues with 3-0 Vicryl. The skin was then closed with benzoin and Steri-Strips. I then made a dorsal midline incision in the lumbar region over L2-3. Open the fascia and exposed L2-3 in a subperiosteal fashion. Intraoperative x-ray confirmed my level and then the spinous process was removed. I used a combination of the high-speed drill and Kerrison punches to perform a laminectomy, medial facetectomy and foraminotomies bilaterally at  L2-3. The lateral recess was decompressed by undercutting the facets. The overgrown yellow ligament was removed. I decompressed until I was distal to the L3 pedicle. The midline appear to be well decompressed. A palpated along  the pedicle and along the nerve roots to assure adequate decompression. I then irrigated with copious amounts of bacitracin-containing saline solution, lined the dura with Gelfoam, checked for any bleeding, and then closed the fascia with 0 Vicryl. As the subcutaneous and subcuticular tissue to him 3-0 Vicryl. Skin was closed with benzoin Steri-Strips. The drapes were removed, a sterile dressing was applied. The patient was awakened from general anesthesia and transferred to the recovery room in stable condition. At the end of the procedure all sponge, needle and instrument counts were correct.  PLAN OF CARE: Admit to inpatient   PATIENT DISPOSITION:  PACU - hemodynamically stable.   Delay start of Pharmacological VTE agent (>24hrs) due to surgical blood loss or risk of bleeding:  yes

## 2012-07-14 NOTE — Transfer of Care (Signed)
Immediate Anesthesia Transfer of Care Note  Patient: Travis [name]  Procedure(s) Performed: Procedure(s) (LRB): LUMBAR LAMINECTOMY/DECOMPRESSION MICRODISCECTOMY 2 LEVELS (Left)  Patient Location: PACU  Anesthesia Type: General  Level of Consciousness: awake, alert  and oriented  Airway & Oxygen Therapy: Patient Spontanous Breathing and Patient connected to face mask oxygen  Post-op Assessment: Report given to PACU RN and Post -op Vital signs reviewed and stable  Post vital signs: Reviewed and stable  Complications: No apparent anesthesia complications

## 2012-07-14 NOTE — Anesthesia Preprocedure Evaluation (Addendum)
Anesthesia Evaluation  Patient identified by MRN, date of birth, ID band Patient awake    Reviewed: Allergy & Precautions, H&P , NPO status , Patient's Chart, lab work & pertinent test results, reviewed documented beta blocker date and time   History of Anesthesia Complications (+) PONV  Airway Mallampati: II  Neck ROM: Full    Dental  (+) Teeth Intact, Caps and Dental Advisory Given   Pulmonary sleep apnea and Continuous Positive Airway Pressure Ventilation , former smoker,  breath sounds clear to auscultation        Cardiovascular hypertension, Pt. on medications and Pt. on home beta blockers + Valvular Problems/Murmurs Rhythm:Regular Rate:Normal + Systolic murmurs    Neuro/Psych    GI/Hepatic   Endo/Other  Hypothyroidism Morbid obesityDiet controlled DM  Renal/GU      Musculoskeletal   Abdominal (+) + obese,   Peds  Hematology   Anesthesia Other Findings   Reproductive/Obstetrics                          Anesthesia Physical Anesthesia Plan  ASA: II  Anesthesia Plan: General   Post-op Pain Management:    Induction: Intravenous  Airway Management Planned: Oral ETT and Video Laryngoscope Planned  Additional Equipment:   Intra-op Plan:   Post-operative Plan: Extubation in OR  Informed Consent: I have reviewed the patients History and Physical, chart, labs and discussed the procedure including the risks, benefits and alternatives for the proposed anesthesia with the patient or authorized representative who has indicated his/her understanding and acceptance.   Dental advisory given  Plan Discussed with: CRNA, Surgeon and Anesthesiologist  Anesthesia Plan Comments:        Anesthesia Quick Evaluation

## 2012-07-14 NOTE — H&P (Signed)
Subjective: Patient is a 67 y.o. 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 Onset of symptoms was a few weeks ago, gradually worsening since that time.  The pain is rated moderate, and is located at the back and radiates to L quad. The pain is described as burning and occurs intermittently. The symptoms have been progressive. Symptoms are exacerbated by exercise. MRI or CT showed T4-5 HNP and stenosis L2-3.   Past Medical History  Diagnosis Date  . PONV (postoperative nausea and vomiting)   . Hypertension   . DJD (degenerative joint disease)     WHOLE SPINE  . Hyperthyroidism     Had Iodine to resolve issue DX 10 YR AGO  . Hypothyroidism     Low d/t treatment of hyperthyroidism  . Sleep apnea     DX  2009/2010 study done at [facility]  . Type II diabetes mellitus     diet controlled  . Heart murmur     Evaluated by cardiologist 4-5 years ago but does not currently see a cardiologist    Past Surgical History  Procedure Date  . Cholecystectomy   . Back surgery     6 surgeries    Prior to Admission medications   Medication Sig Start Date End Date Taking? Authorizing Provider  baclofen (LIORESAL) 20 MG tablet Take 20 mg by mouth every evening.   Yes Historical Provider, MD  gabapentin (NEURONTIN) 600 MG tablet Take 600 mg by mouth 3 (three) times daily.   Yes Historical Provider, MD  levothyroxine (SYNTHROID, LEVOTHROID) 150 MCG tablet Take 150 mcg by mouth daily.   Yes Historical Provider, MD  lisinopril-hydrochlorothiazide (PRINZIDE,ZESTORETIC) 20-12.5 MG per tablet Take 2 tablets by mouth every evening.    Yes Historical Provider, MD  metoprolol succinate (TOPROL-XL) 25 MG 24 hr tablet Take 25 mg by mouth every evening.    Yes Historical Provider, MD  pramipexole (MIRAPEX) 0.125 MG tablet Take 0.25 mg by mouth at bedtime.    Yes Historical Provider, MD   Allergies  Allergen Reactions  . Other Other (See Comments)    Anesthesia makes sick, must have antinausea  medication in advance.    History  Substance Use Topics  . Smoking status: Former Smoker -- 2.0 packs/day for 20 years    Types: Cigarettes    Quit date: 06/28/1985  . Smokeless tobacco: Former Systems developer    Quit date: 06/28/1985  . Alcohol Use: No    History reviewed. No pertinent family history.   Review of Systems  Positive ROS: neg  All other systems have been reviewed and were otherwise negative with the exception of those mentioned in the HPI and as above.  Objective: Vital signs in last 24 hours: Temp:  [97.6 F (36.4 C)] 97.6 F (36.4 C) (07/17 1407) Pulse Rate:  [60] 60  (07/17 1407) Resp:  [18] 18  (07/17 1407) SpO2:  [96 %] 96 % (07/17 1407)  General Appearance: Alert, cooperative, no distress, appears stated age Head: Normocephalic, without obvious abnormality, atraumatic Eyes: PERRL, conjunctiva/corneas clear, EOM's intact, fundi benign, both eyes      Ears: Normal TM's and external ear canals, both ears Throat: Lips, mucosa, and tongue normal; teeth and gums normal Neck: Supple, symmetrical, trachea midline, no adenopathy; thyroid: No enlargement/tenderness/nodules; no carotid bruit or JVD Back: Symmetric, no curvature, ROM normal, no CVA tenderness Lungs: Clear to auscultation bilaterally, respirations unlabored Heart: Regular rate and rhythm, S1 and S2 normal, no murmur, rub or gallop Abdomen: Soft, non-tender,  bowel sounds active all four quadrants, no masses, no organomegaly Extremities: Extremities normal, atraumatic, no cyanosis or edema Pulses: 2+ and symmetric all extremities Skin: Skin color, texture, turgor normal, no rashes or lesions  NEUROLOGIC:   Mental status: Alert and oriented x4,  no aphasia, good attention span, fund of knowledge, and memory Motor Exam - grossly normal Sensory Exam - grossly normal Reflexes: 3+ Coordination - poor Gait - spastic Balance - poor Cranial Nerves: I: smell Not tested  II: visual acuity  OS: nl    OD: nl  II:  visual fields Full to confrontation  II: pupils Equal, round, reactive to light  III,VII: ptosis None  III,IV,VI: extraocular muscles  Full ROM  V: mastication Normal  V: facial light touch sensation  Normal  V,VII: corneal reflex  Present  VII: facial muscle function - upper  Normal  VII: facial muscle function - lower Normal  VIII: hearing Not tested  IX: soft palate elevation  Normal  IX,X: gag reflex Present  XI: trapezius strength  5/5  XI: sternocleidomastoid strength 5/5  XI: neck flexion strength  5/5  XII: tongue strength  Normal    Data Review Lab Results  Component Value Date   WBC 4.9 07/13/2012   HGB 12.9* 07/13/2012   HCT 38.7* 07/13/2012   MCV 88.8 07/13/2012   PLT 148* 07/13/2012   Lab Results  Component Value Date   NA 142 07/13/2012   K 3.7 07/13/2012   CL 104 07/13/2012   CO2 29 07/13/2012   BUN 22 07/13/2012   CREATININE 1.08 07/13/2012   GLUCOSE 156* 07/13/2012   Lab Results  Component Value Date   INR 1.03 07/14/2012    Assessment/Plan: Patient admitted for 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 Patient has failed conservative therapy. Progressive difficulty with gait.  I explained the condition and procedure to the patient and answered any questions.  Patient wishes to proceed with procedure as planned. Understands risks/ benefits and typical outcomes of procedure.   Travis [name] S 07/14/2012 3:28 PM

## 2012-07-14 NOTE — Preoperative (Signed)
Beta Blockers   Reason not to administer Beta Blockers:Not Applicable 

## 2012-07-15 ENCOUNTER — Encounter (HOSPITAL_COMMUNITY): Payer: Self-pay | Admitting: Neurological Surgery

## 2012-07-15 MED ORDER — HYDROCHLOROTHIAZIDE 25 MG PO TABS
25.0000 mg | ORAL_TABLET | Freq: Every day | ORAL | Status: DC
  Administered 2012-07-15: 25 mg via ORAL
  Filled 2012-07-15 (×2): qty 1

## 2012-07-15 MED ORDER — LISINOPRIL-HYDROCHLOROTHIAZIDE 20-12.5 MG PO TABS: 2.0000 | ORAL_TABLET | Freq: Every evening | ORAL | Status: DC

## 2012-07-15 MED ORDER — SENNA 8.6 MG PO TABS
1.0000 | ORAL_TABLET | Freq: Two times a day (BID) | ORAL | Status: DC
  Administered 2012-07-15 (×2): 8.6 mg via ORAL
  Filled 2012-07-15 (×3): qty 1

## 2012-07-15 MED ORDER — SODIUM CHLORIDE 0.9 % IV SOLN
250.0000 mL | INTRAVENOUS | Status: DC
  Administered 2012-07-15: 500 mL via INTRAVENOUS

## 2012-07-15 MED ORDER — ACETAMINOPHEN 325 MG PO TABS
650.0000 mg | ORAL_TABLET | ORAL | Status: DC | PRN
  Administered 2012-07-15: 650 mg via ORAL
  Filled 2012-07-15: qty 2

## 2012-07-15 MED ORDER — SODIUM CHLORIDE 0.9 % IJ SOLN
3.0000 mL | Freq: Two times a day (BID) | INTRAMUSCULAR | Status: DC
  Administered 2012-07-15 (×2): 3 mL via INTRAVENOUS

## 2012-07-15 MED ORDER — CEFAZOLIN SODIUM 1-5 GM-% IV SOLN
1.0000 g | Freq: Three times a day (TID) | INTRAVENOUS | Status: AC
  Administered 2012-07-15 (×2): 1 g via INTRAVENOUS
  Filled 2012-07-15 (×2): qty 50

## 2012-07-15 MED ORDER — ACETAMINOPHEN 650 MG RE SUPP: 650.0000 mg | RECTAL | Status: DC | PRN

## 2012-07-15 MED ORDER — POTASSIUM CHLORIDE IN NACL 20-0.9 MEQ/L-% IV SOLN
INTRAVENOUS | Status: DC
  Administered 2012-07-15: 02:00:00 via INTRAVENOUS
  Filled 2012-07-15 (×2): qty 1000

## 2012-07-15 MED ORDER — METOPROLOL SUCCINATE ER 25 MG PO TB24
25.0000 mg | ORAL_TABLET | Freq: Every evening | ORAL | Status: DC
  Administered 2012-07-15: 25 mg via ORAL
  Filled 2012-07-15 (×2): qty 1

## 2012-07-15 MED ORDER — ONDANSETRON HCL 4 MG/2ML IJ SOLN: 4.0000 mg | INTRAMUSCULAR | Status: DC | PRN

## 2012-07-15 MED ORDER — LISINOPRIL 40 MG PO TABS
40.0000 mg | ORAL_TABLET | Freq: Every day | ORAL | Status: DC
  Administered 2012-07-15: 40 mg via ORAL
  Filled 2012-07-15 (×2): qty 1

## 2012-07-15 MED ORDER — DEXAMETHASONE SODIUM PHOSPHATE 4 MG/ML IJ SOLN
4.0000 mg | Freq: Four times a day (QID) | INTRAMUSCULAR | Status: DC
  Administered 2012-07-15: 4 mg via INTRAVENOUS
  Filled 2012-07-15 (×5): qty 1

## 2012-07-15 MED ORDER — MENTHOL 3 MG MT LOZG: 1.0000 | LOZENGE | OROMUCOSAL | Status: DC | PRN

## 2012-07-15 MED ORDER — DEXAMETHASONE 4 MG PO TABS
4.0000 mg | ORAL_TABLET | Freq: Four times a day (QID) | ORAL | Status: DC
  Administered 2012-07-15: 4 mg via ORAL
  Filled 2012-07-15 (×6): qty 1

## 2012-07-15 MED ORDER — MORPHINE SULFATE 2 MG/ML IJ SOLN: 1.0000 mg | INTRAMUSCULAR | Status: DC | PRN

## 2012-07-15 MED ORDER — PHENOL 1.4 % MT LIQD: 1.0000 | OROMUCOSAL | Status: DC | PRN

## 2012-07-15 MED ORDER — GABAPENTIN 600 MG PO TABS
600.0000 mg | ORAL_TABLET | Freq: Three times a day (TID) | ORAL | Status: DC
  Administered 2012-07-15 (×2): 600 mg via ORAL
  Filled 2012-07-15 (×4): qty 1

## 2012-07-15 MED ORDER — PRAMIPEXOLE DIHYDROCHLORIDE 0.25 MG PO TABS
0.2500 mg | ORAL_TABLET | Freq: Every day | ORAL | Status: DC
  Filled 2012-07-15: qty 1

## 2012-07-15 MED ORDER — BACLOFEN 20 MG PO TABS
20.0000 mg | ORAL_TABLET | Freq: Every evening | ORAL | Status: DC
  Filled 2012-07-15: qty 1

## 2012-07-15 MED ORDER — LEVOTHYROXINE SODIUM 150 MCG PO TABS
150.0000 ug | ORAL_TABLET | Freq: Every day | ORAL | Status: DC
  Administered 2012-07-15: 150 ug via ORAL
  Filled 2012-07-15: qty 1

## 2012-07-15 MED ORDER — SODIUM CHLORIDE 0.9 % IJ SOLN: 3.0000 mL | INTRAMUSCULAR | Status: DC | PRN

## 2012-07-15 NOTE — Care Management Note (Signed)
    Page 1 of 1   07/15/2012     2:25:01 PM   CARE MANAGEMENT NOTE 07/15/2012  Patient:  Travis [name]   Account Number:  [id number]  Date Initiated:  07/15/2012  Documentation initiated by:  Fuller Plan  Subjective/Objective Assessment:   Admitted postop T 4-5 decompression, decompression lumbar lam.     Action/Plan:   Required CPAP postop   Anticipated DC Date:  07/18/2012   Anticipated DC Plan:  [place]  CM consult      Choice offered to / List presented to:             Status of service:  Completed, signed off Medicare Important Message given?   (If response is "NO", the following Medicare IM given date fields will be blank) Date Medicare IM given:   Date Additional Medicare IM given:    Discharge Disposition:  HOME/SELF CARE  Per UR Regulation:  Reviewed for med. necessity/level of care/duration of stay  If discussed at [place] of Stay Meetings, dates discussed:    Comments:  07/15/12 Rolling walker ordered, spoke with patient, he already has roling walker. No equipment needs identified. Fuller Plan RN, BSN, CCM

## 2012-07-15 NOTE — Progress Notes (Signed)
Patient evaluated for long-term disease management services with [facility] as a benefit of his [employer]. Discussed and explained services with patient and his wife at bedside. He reports that he is about to discharge home in one hour. Explained to him that he will receive a post discharge transition of care telephone call and possibly monthly home visits for assessments and education as needed. Left [facility] packet along with contact information with pt's wife. Both wife and patient were appreciative of the visit.  Marthenia Rolling, MSN-Ed, RN, BSN [facility] Liaison [phone]

## 2012-07-15 NOTE — Research (Signed)
Transported pt via wheelchair to private car with wife and daughter. Pt denies any needs verbalize understanding of discharging teaching. Reminded to call Dr. Ronnald Ramp for follow up appointment.

## 2012-07-15 NOTE — Discharge Summary (Signed)
Physician Discharge Summary  Patient ID: Travis [name] MRN: [id number] DOB/AGE: 02/06/1946 66 y.o.  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 The patient tolerated the procedure well and was taken to the recovery room and then to the ICU in stable condition. The hospital course was routine. There were no complications. The wound remained clean dry and intact. Pt had appropriate back soreness. No complaints of leg pain or new N/T/W. The patient remained afebrile with stable vital signs, and tolerated a regular diet. The patient continued to increase activities, and pain was well controlled with oral pain medications.   Consults: None  Significant Diagnostic Studies:  Results for orders placed during the hospital encounter of 07/14/12  PROTIME-INR      Component Value Range   Prothrombin Time 13.7  11.6 - 15.2 seconds   INR 1.03  0.00 - 1.49  DIFFERENTIAL      Component Value Range   Neutrophils Relative 66  43 - 77 %   Neutro Abs 2.7  1.7 - 7.7 K/uL   Lymphocytes Relative 23  12 - 46 %   Lymphs Abs 0.9  0.7 - 4.0 K/uL   Monocytes Relative 8  3 - 12 %   Monocytes Absolute 0.3  0.1 - 1.0 K/uL   Eosinophils Relative 3  0 - 5 %   Eosinophils Absolute 0.1  0.0 - 0.7 K/uL   Basophils Relative 0  0 - 1 %   Basophils Absolute 0.0  0.0 - 0.1 K/uL  GLUCOSE, CAPILLARY      Component Value Range   Glucose-Capillary 116 (*) 70 - 99 mg/dL  GLUCOSE, CAPILLARY      Component Value Range   Glucose-Capillary 168 (*) 70 - 99 mg/dL   Comment 1 Notify RN     Comment 2 Documented in Chart    POCT I-STAT 4, (NA,K, GLUC, HGB,HCT)      Component Value  Range   Sodium 143  135 - 145 mEq/L   Potassium 3.8  3.5 - 5.1 mEq/L   Glucose, Bld 176 (*) 70 - 99 mg/dL   HCT 37.0 (*) 39.0 - 52.0 %   Hemoglobin 12.6 (*) 13.0 - 17.0 g/dL    Mr Thoracic Spine W Wo Contrast  07/06/2012  *RADIOLOGY REPORT*  Clinical Data: Thoracic and lumbar back pain with weakness and numbness in the legs for 3 months.  Previous thoracic spine surgery.  BUN and creatinine were obtained on site at [place] at [address] Results:  BUN 21 mg/dL,  Creatinine 1.1 mg/dL.  MRI THORACIC SPINE WITHOUT AND WITH CONTRAST  Technique:  Multiplanar and multiecho pulse sequences of the thoracic spine were obtained without and with intravenous contrast.  Contrast: 24mL MULTIHANCE GADOBENATE DIMEGLUMINE 529 MG/ML IV SOLN  Comparison: CT myelogram dated 05/27/2012 and thoracic MRI dated 01/01/2012  Findings: Scan extends from C7-T1 through T10-11.  Since the prior study the patient has had posterior fusion at T7-8, T8-9, and T9-10.  Pedicle screws are in place. There is a previous fusion from C5-T1 which appears unchanged on the scout image.  There has been no change in the multilevel degenerative disc disease.  There are focal disc  protrusions at T1-2 to the right which compress the right side of the spinal cord and causes slight focal myelopathy, disc protrusions to the left T2-3 and T3-4 without significant spinal cord compression, prominent disc protrusion just to the left of midline at T4-5 with slight compression of the left side of the spinal cord, unchanged, disc protrusion central to the right at T5-6, and chronic degenerative disc protrusions at T7-8, T8-9, and T9-10 without change.   Evidence of previous laminectomies at the fused levels.  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  IMPRESSION:  1.  No significant change in the multilevel disc protrusions. Focal slight myelopathy at T4-5 and at T1-2, unchanged. 2.  Interval  posterior fusion at T7-8, T8-9, and T9-10.  Original Report Authenticated By: Larey Seat, M.D.   Mr Lumbar Spine W Wo Contrast  07/06/2012  **ADDENDUM** CREATED: 2012-07-06 09:51:22 This addendum is given for the purpose of noting that BUN and creatinine  were obtained at [place] at [address] Bun was 21 and  creatinine was 1.1. **END ADDENDUM** SIGNED BY: [email or web address]   07/06/2012  *RADIOLOGY REPORT*  Clinical Data: History of mid and low back pain with weakness in both legs and numbness.  Symptoms for 3 months.  History of prior surgery.  MRI LUMBAR SPINE WITHOUT AND WITH CONTRAST  Technique:  Multiplanar and multiecho pulse sequences of the lumbar spine were obtained without and with intravenous contrast.  Contrast:  20 ml Multihance IV.  Comparison: MRI lumbar spine 12/28/2011.  Findings: Vertebral body height is maintained.  There is unchanged retrolisthesis of L3 on L4 of 0.8 cm.  Alignment is otherwise unremarkable.  The conus medullaris is normal in signal position. 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  Imaged intra-abdominal contents are unremarkable.  The T12-L1 level is imaged in the sagittal plane only and negative.  L1-2:  Negative.  L2-3:  There has been some progression of disease at this level with a broad-based disc bulge eccentric to the left again identified.  Moderately severe to severe central canal and lateral recess narrowing is seen.  Marked left foraminal narrowing appears unchanged.  Mild right foraminal narrowing noted.  L3-4:  Status post posterior decompression.  Central canal appears open with a disc bulge again seen.  Bilateral foraminal narrowing appears unchanged.  L4-5:  Status post posterior decompression.  Disc bulging and ligamentum flavum thickening are again seen.  Disc protrusion with caudal extension in the left lateral recess is again identified. Central canal is mildly to moderately narrowed.  Narrowing  of the lateral recesses, worse on the left where it is severe and severe left foraminal narrowing, appear unchanged.  Moderate right foraminal narrowing is also unchanged.  L5-S1:  There is a central disc protrusion slightly indenting the ventral thecal sac but the central canal is open.  Lateral recess and foraminal narrowing appear unchanged.  IMPRESSION:  1.  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  Left worse than right foraminal narrowing at this level again noted. 2.  No change in a disc bulge and superimposed downturning left lateral recess protrusion at L3-4.  Left worse than right foraminal and lateral recess stenosis persists. 3.  No change in a central protrusion at L5-S1 causing mild to moderate lateral recess narrowing, more notable on the left.  Original Report Authenticated  By: Arvid Right D'ALESSIO, M.D.   Dg Lumbar Spine 1 View  07/14/2012  *RADIOLOGY REPORT*  Clinical Data: Back pain  LUMBAR SPINE - 1 VIEW  Comparison: MRI 07/05/2012  Findings: Intraoperative cross-table lateral film demonstrates a probe directed most closely toward L2-L3.  IMPRESSION: L2-L3 marked.  Original Report Authenticated By: Staci Righter, M.D.   Dg Thoracic Spine 1 View  07/14/2012  *RADIOLOGY REPORT*  Clinical Data: Thoracic disc protrusions.  Previous thoracic fusion.  THORACIC SPINE - 1 VIEW  Comparison: 07/05/2012  Findings: This single lateral intraoperative thoracic radiograph demonstrates metallic instruments projecting posterior to the T5-6 interspace and in the T4-5 foramen. Fusion hardware T7-T10 noted.  IMPRESSION:  Intraoperative localization  Original Report Authenticated By: Trecia Rogers, M.D.   Dg Chest Port 1 View  07/14/2012  *RADIOLOGY REPORT*  Clinical Data: Shortness of breath and hypoxemia.  Post thoracic and lumbar laminectomies.  PORTABLE CHEST - 1 VIEW  Comparison: 02/03/2012   Findings: Shallow inspiration with elevation right hemidiaphragm. Borderline heart size and pulmonary vascularity may represent early vascular congestion.  No evidence of edema or consolidation. Linear atelectasis in the left midlung.  No blunting of costophrenic angles.  No pneumothorax.  Calcification of the aorta. Postoperative changes in the spine.  IMPRESSION: Shallow inspiration with elevation of left hemidiaphragm. Borderline heart size and pulmonary vascular may represent early congestion without edema or consolidation.  Original Report Authenticated By: Neale Burly, M.D.    Antibiotics:  Anti-infectives     Start     Dose/Rate Route Frequency Ordered Stop   07/15/12 0023   ceFAZolin (ANCEF) IVPB 1 g/50 mL premix        1 g 100 mL/hr over 30 Minutes Intravenous Every 8 hours 07/15/12 0023 07/15/12 0909   07/14/12 1748   bacitracin 50,000 Units in sodium chloride irrigation 0.9 % 500 mL irrigation  Status:  Discontinued          As needed 07/14/12 1748 07/14/12 1948   07/14/12 1550   bacitracin 50000 UNITS injection     Comments: KEY, JENNIFER: cabinet override         07/14/12 1550 07/15/12 0359   07/14/12 1417   ceFAZolin (ANCEF) 3 g in dextrose 5 % 50 mL IVPB     Comments: Per weight based protocol      3 g 160 mL/hr over 30 Minutes Intravenous 60 min pre-op 07/14/12 1418 07/14/12 1637   07/14/12 1351   ceFAZolin (ANCEF) 1 g in dextrose 5 % 50 mL IVPB  Status:  Discontinued        1 g 120 mL/hr over 30 Minutes Intravenous 60 min pre-op 07/14/12 1351 07/14/12 1418          Discharge Exam: Blood pressure 143/72, pulse 89, temperature 97.7 F (36.5 C), temperature source Oral, resp. rate 18, height 6\' 1"  (1.854 m), weight 127.8 kg (281 lb 12 oz), SpO2 94.00%. Neurologic: Mental status: Alert, oriented, thought content appropriate Motor: Fairly good strength similar to preop Gait: Abnormal Gait is spastic similar to preop, reflexes are brisk  Discharge Medications:    Medication List  As of 07/15/2012  9:21 AM   TAKE these medications         baclofen 20 MG tablet   Commonly known as: LIORESAL   Take 20 mg by mouth every evening.      gabapentin 600 MG tablet   Commonly known as: NEURONTIN   Take 600 mg by mouth 3 (three)  times daily.      levothyroxine 150 MCG tablet   Commonly known as: SYNTHROID, LEVOTHROID   Take 150 mcg by mouth daily.      lisinopril-hydrochlorothiazide 20-12.5 MG per tablet   Commonly known as: PRINZIDE,ZESTORETIC   Take 2 tablets by mouth every evening.      metoprolol succinate 25 MG 24 hr tablet   Commonly known as: TOPROL-XL   Take 25 mg by mouth every evening.      pramipexole 0.125 MG tablet   Commonly known as: MIRAPEX   Take 0.25 mg by mouth at bedtime.            Disposition: Home   Final Dx: Thoracic and lumbar decompression  Discharge Orders    Future Orders Please Complete By Expires   Diet - low sodium heart healthy      Increase activity slowly      Driving Restrictions      Comments:   2 weeks   Lifting restrictions      Comments:   Less than 8 pounds   Remove dressing in 48 hours      Call MD for:  temperature >100.4      Call MD for:  persistant nausea and vomiting      Call MD for:  severe uncontrolled pain      Call MD for:  redness, tenderness, or signs of infection (pain, swelling, redness, odor or green/yellow discharge around incision site)      Call MD for:  difficulty breathing, headache or visual disturbances         Follow-up Information    Follow up with JONES,DAVID S, MD. Schedule an appointment as soon as possible for a visit in 2 weeks.   Contact information:   [address] [place] [phone]           Signed: Eustace Moore 07/15/2012, 9:21 AM

## 2012-07-15 NOTE — Progress Notes (Signed)
Discussed with the patient and all questioned fully answered. He will call me if any problems arise. Discharge instructions given. Spouse at bedside. Instruct to make follow up appointment with Dr.Jones. Pt was able to walk in hallway with assist and walker. Tolerating well pt states ready to go home and denies pain.

## 2012-07-15 NOTE — Progress Notes (Signed)
Patient states he still does not feel he can urinate.  Bladder scan revealed 235 ml of urine.  In and out cath performed, 300 ml out.

## 2012-09-15 DIAGNOSIS — M549 Dorsalgia, unspecified: Secondary | ICD-10-CM | POA: Insufficient documentation

## 2013-02-17 DIAGNOSIS — M216X9 Other acquired deformities of unspecified foot: Secondary | ICD-10-CM | POA: Insufficient documentation

## 2013-02-17 DIAGNOSIS — R269 Unspecified abnormalities of gait and mobility: Secondary | ICD-10-CM | POA: Insufficient documentation

## 2013-02-17 DIAGNOSIS — M4712 Other spondylosis with myelopathy, cervical region: Secondary | ICD-10-CM | POA: Insufficient documentation

## 2013-02-17 DIAGNOSIS — G63 Polyneuropathy in diseases classified elsewhere: Secondary | ICD-10-CM | POA: Insufficient documentation

## 2013-02-17 DIAGNOSIS — R6889 Other general symptoms and signs: Secondary | ICD-10-CM | POA: Insufficient documentation

## 2013-04-05 ENCOUNTER — Telehealth: Payer: Self-pay

## 2013-04-05 MED ORDER — GABAPENTIN 600 MG PO TABS: 600.0000 mg | ORAL_TABLET | Freq: Three times a day (TID) | ORAL | Status: DC

## 2013-04-05 NOTE — Telephone Encounter (Signed)
Travis [name] called requesting refills on Gabapentin at 600mg  TID dose.

## 2013-07-13 ENCOUNTER — Encounter: Payer: Self-pay | Admitting: Neurology

## 2013-07-13 DIAGNOSIS — M4712 Other spondylosis with myelopathy, cervical region: Secondary | ICD-10-CM

## 2013-07-13 DIAGNOSIS — R6889 Other general symptoms and signs: Secondary | ICD-10-CM

## 2013-07-13 DIAGNOSIS — R269 Unspecified abnormalities of gait and mobility: Secondary | ICD-10-CM

## 2013-07-13 DIAGNOSIS — M216X9 Other acquired deformities of unspecified foot: Secondary | ICD-10-CM

## 2013-07-13 DIAGNOSIS — G63 Polyneuropathy in diseases classified elsewhere: Secondary | ICD-10-CM

## 2013-07-13 DIAGNOSIS — D518 Other vitamin B12 deficiency anemias: Secondary | ICD-10-CM | POA: Insufficient documentation

## 2013-07-18 ENCOUNTER — Encounter: Payer: Self-pay | Admitting: Neurology

## 2013-07-18 ENCOUNTER — Ambulatory Visit (INDEPENDENT_AMBULATORY_CARE_PROVIDER_SITE_OTHER): Payer: Medicare Other | Admitting: Neurology

## 2013-07-18 VITALS — BP 157/64 | HR 52 | Ht 73.0 in | Wt 283.0 lb

## 2013-07-18 DIAGNOSIS — R269 Unspecified abnormalities of gait and mobility: Secondary | ICD-10-CM

## 2013-07-18 DIAGNOSIS — M4712 Other spondylosis with myelopathy, cervical region: Secondary | ICD-10-CM

## 2013-07-18 MED ORDER — OXYBUTYNIN CHLORIDE 5 MG PO TABS: 5.0000 mg | ORAL_TABLET | Freq: Every day | ORAL | Status: DC

## 2013-07-18 MED ORDER — TIZANIDINE HCL 4 MG PO TABS: 4.0000 mg | ORAL_TABLET | Freq: Every day | ORAL | Status: DC

## 2013-07-18 NOTE — Progress Notes (Signed)
Reason for visit: Cervical myelopathy  Travis [name] is an 67 y.o. male  History of present illness:  Travis [name] is a 67 year old right-handed white male with a history of a cervical myelopathy associated with a gait disorder. The patient is having issues with urinary urgency usually during the day, not night. The patient has gained benefit from the use of tizanidine taking 4 mg at night. The patient still has some problems with spasticity issues at night while sleeping. The patient has to sleep with his feet out from the covers, as the covers activate the spasms. The patient has had troubles with weight loss, and he tries to exercise daily on a stationary bike. The patient has not wished to have AFO braces for his foot drops, and he indicates that he has not had any falls since last seen. The patient will use a walker at home on occasion, and he uses a cane when out of the house. The patient does fatigue with walking. The patient returns to this office for an evaluation.  Past Medical History  Diagnosis Date  . PONV (postoperative nausea and vomiting)   . Hypertension   . DJD (degenerative joint disease)     WHOLE SPINE  . Hyperthyroidism     Had Iodine to resolve issue DX 10 YR AGO  . Hypothyroidism     Low d/t treatment of hyperthyroidism  . Sleep apnea     DX  2009/2010 study done at [facility]  . Type II diabetes mellitus     diet controlled  . Heart murmur     Evaluated by cardiologist 4-5 years ago but does not currently see a cardiologist  . Gait disorder   . Cervical myelopathy     with myelomalacia at C5-6  . RLS (restless legs syndrome)   . Peripheral neuropathy     Past Surgical History  Procedure Laterality Date  . Cholecystectomy    . Back surgery      6 surgeries  . Lumbar laminectomy/decompression microdiscectomy  07/14/2012    Procedure: LUMBAR LAMINECTOMY/DECOMPRESSION MICRODISCECTOMY 2 LEVELS;  Surgeon: Eustace Moore, MD;  Location: [facility];  Service:  Neurosurgery;  Laterality: Left;  Lumbar two three laminectomy, left thoracic four five transpedicular diskectomy    Family History  Problem Relation Age of Onset  . Polycythemia Mother   . Diabetes Father   . Hypertension Father   . Heart disease Father     Social history:  reports that he quit smoking about 28 years ago. His smoking use included Cigarettes. He has a 40 pack-year smoking history. He quit smokeless tobacco use about 28 years ago. He reports that he does not drink alcohol or use illicit drugs.  Allergies:  Allergies  Allergen Reactions  . Other Other (See Comments)    Anesthesia makes sick, must have antinausea medication in advance.    Medications:  Current Outpatient Prescriptions on File Prior to Visit  Medication Sig Dispense Refill  . gabapentin (NEURONTIN) 600 MG tablet Take 1 tablet (600 mg total) by mouth 3 (three) times daily.  90 tablet  6  . levothyroxine (SYNTHROID, LEVOTHROID) 150 MCG tablet Take 150 mcg by mouth daily.      [name] lisinopril-hydrochlorothiazide (PRINZIDE,ZESTORETIC) 20-12.5 MG per tablet Take 2 tablets by mouth every evening.       . metoprolol succinate (TOPROL-XL) 25 MG 24 hr tablet Take 25 mg by mouth every evening.       . pramipexole (MIRAPEX) 0.125 MG  tablet Take 0.25 mg by mouth at bedtime.        No current facility-administered medications on file prior to visit.    ROS:  Out of a complete 14 system review of symptoms, the patient complains only of the following symptoms, and all other reviewed systems are negative.  Weakness Gait disturbance Urinary urgency  Blood pressure 157/64, pulse 52, height 6\' 1"  (1.854 m), weight 283 lb (128.368 kg).  Physical Exam  General: The patient is alert and cooperative at the time of the examination.  Skin: No significant peripheral edema is noted.   Neurologic Exam  Cranial nerves: Facial symmetry is present. Speech is normal, no aphasia or dysarthria is noted. Extraocular  movements are full. Visual fields are full.  Motor: The patient has good strength in all 4 extremities.  Coordination: The patient has good finger-nose-finger and heel-to-shin bilaterally.  Gait and station: The patient has a normal gait. Tandem gait is normal. Romberg is negative. No drift is seen.  Reflexes: Deep tendon reflexes are symmetric.   Assessment/Plan:  1. Cervical myelopathy  2. Gait disturbance  3. Urinary urgency   The patient is having some problems with urinary urgency on occasion. The patient will be placed on oxybutynin to take if needed. The patient will continue the tizanidine 4 mg at night and he will pursue a Weight Watchers diet for weight loss. He will continue exercising on a regular basis. In the future, AFO braces may be helpful for his walking, but he does not wish to pursue this at this time. The patient followup in 6-8 months.  Jill Alexanders MD 07/18/2013 7:39 PM  [facility] [address] [place], [place] [address]  Phone [phone] Fax [phone]

## 2013-11-16 ENCOUNTER — Other Ambulatory Visit: Payer: Self-pay | Admitting: Neurology

## 2013-11-30 ENCOUNTER — Telehealth: Payer: Self-pay | Admitting: Neurology

## 2013-11-30 NOTE — Telephone Encounter (Signed)
HAS NEW PROBLEM WITH LEG--NEEDS SOON APPT

## 2014-03-13 ENCOUNTER — Encounter (INDEPENDENT_AMBULATORY_CARE_PROVIDER_SITE_OTHER): Payer: Self-pay

## 2014-03-13 ENCOUNTER — Encounter: Payer: Self-pay | Admitting: Neurology

## 2014-03-13 ENCOUNTER — Ambulatory Visit (INDEPENDENT_AMBULATORY_CARE_PROVIDER_SITE_OTHER): Payer: Medicare Other | Admitting: Neurology

## 2014-03-13 VITALS — BP 155/53 | HR 65 | Wt 289.0 lb

## 2014-03-13 DIAGNOSIS — R269 Unspecified abnormalities of gait and mobility: Secondary | ICD-10-CM

## 2014-03-13 DIAGNOSIS — M4807 Spinal stenosis, lumbosacral region: Secondary | ICD-10-CM | POA: Insufficient documentation

## 2014-03-13 DIAGNOSIS — M48061 Spinal stenosis, lumbar region without neurogenic claudication: Secondary | ICD-10-CM

## 2014-03-13 DIAGNOSIS — R209 Unspecified disturbances of skin sensation: Secondary | ICD-10-CM

## 2014-03-13 HISTORY — DX: Spinal stenosis, lumbosacral region: M48.07

## 2014-03-13 NOTE — Progress Notes (Signed)
Reason for visit: Gait disturbance  Travis [name] is an 68 y.o. male  History of present illness:  Travis [name] is a 68 year old right-handed white male with a history of a thoracic myelopathy, and lumbosacral spinal stenosis most prominent at the L2-3 level. The patient has had some intermittent numbness involving the left leg at the hip level down to the knee, and occasionally down the leg into the heel. The patient indicates that there is no discomfort with this, and the ability to ambulate has not changed. The patient denies any weakness that is new involving the legs. There are no new symptoms involving the right leg. The patient has not had any falls. The patient continues to have some problems with constipation and frequency of urination. The patient reports some problems with the feet at night on occasion associated with a restless legs type sensation. The patient takes Mirapex for this with some benefit. The patient returns to this office for an evaluation.  Past Medical History  Diagnosis Date  . PONV (postoperative nausea and vomiting)   . Hypertension   . DJD (degenerative joint disease)     WHOLE SPINE  . Hyperthyroidism     Had Iodine to resolve issue DX 10 YR AGO  . Hypothyroidism     Low d/t treatment of hyperthyroidism  . Sleep apnea     DX  2009/2010 study done at [facility]  . Type II diabetes mellitus     diet controlled  . Heart murmur     Evaluated by cardiologist 4-5 years ago but does not currently see a cardiologist  . Gait disorder   . Cervical myelopathy     with myelomalacia at C5-6  . RLS (restless legs syndrome)   . Peripheral neuropathy   . Lumbosacral spinal stenosis 03/13/2014    Past Surgical History  Procedure Laterality Date  . Cholecystectomy    . Back surgery      6 surgeries  . Lumbar laminectomy/decompression microdiscectomy  07/14/2012    Procedure: LUMBAR LAMINECTOMY/DECOMPRESSION MICRODISCECTOMY 2 LEVELS;  Surgeon: Eustace Moore, MD;   Location: [facility];  Service: Neurosurgery;  Laterality: Left;  Lumbar two three laminectomy, left thoracic four five transpedicular diskectomy    Family History  Problem Relation Age of Onset  . Polycythemia Mother   . Diabetes Father   . Hypertension Father   . Heart disease Father     Social history:  reports that he quit smoking about 28 years ago. His smoking use included Cigarettes. He has a 40 pack-year smoking history. He quit smokeless tobacco use about 28 years ago. He reports that he does not drink alcohol or use illicit drugs.    Allergies  Allergen Reactions  . Other Other (See Comments)    Anesthesia makes sick, must have antinausea medication in advance.    Medications:  Current Outpatient Prescriptions on File Prior to Visit  Medication Sig Dispense Refill  . diclofenac (VOLTAREN) 75 MG EC tablet Take 75 mg by mouth daily.      [name] doxazosin (CARDURA) 4 MG tablet Take 4 mg by mouth at bedtime.      . gabapentin (NEURONTIN) 600 MG tablet TAKE 1 TABLET BY MOUTH 3 TIMES A DAY  90 tablet  3  . levothyroxine (SYNTHROID, LEVOTHROID) 150 MCG tablet Take 150 mcg by mouth daily.      [name] lisinopril-hydrochlorothiazide (PRINZIDE,ZESTORETIC) 20-12.5 MG per tablet Take 2 tablets by mouth every evening.       [name]  metoprolol succinate (TOPROL-XL) 25 MG 24 hr tablet Take 25 mg by mouth every evening.       [name] oxybutynin (DITROPAN) 5 MG tablet Take 1 tablet (5 mg total) by mouth daily.  30 tablet  3  . pramipexole (MIRAPEX) 0.125 MG tablet Take 0.25 mg by mouth at bedtime.       [name] tiZANidine (ZANAFLEX) 4 MG tablet Take 1 tablet (4 mg total) by mouth at bedtime.  30 tablet  5   No current facility-administered medications on file prior to visit.    ROS:  Out of a complete 14 system review of symptoms, the patient complains only of the following symptoms, and all other reviewed systems are negative.  Constipation Restless legs Frequency of urination Joint pain, joint swelling,  back pain, achy muscles, muscle cramps, walking difficulties Numbness, weakness  Blood pressure 155/53, pulse 65, weight 289 lb (131.09 kg).  Physical Exam  General: The patient is alert and cooperative at the time of the examination.  Skin: 3+ edema below the knees is noted bilaterally.   Neurologic Exam  Mental status: The patient is oriented x 3.  Cranial nerves: Facial symmetry is present. Speech is normal, no aphasia or dysarthria is noted. Extraocular movements are full. Visual fields are full.  Motor: The patient has good strength in the upper extremities. With the lower extremities, there is 4/5 strength proximally in the legs. The patient has mild bilateral foot drops. The patient is unable to and leg on the heels and the toes. Good strength with extension and flexion at the knees is noted.  Sensory examination: Soft touch sensation is decreased on the left leg as compared to right, symmetric on the hands and face.  Coordination: The patient has good finger-nose-finger bilaterally. The patient has difficulty performing heel-to-shin bilaterally.  Gait and station: The patient wide-based, diplegia gait. The patient uses a cane for ambulation. Tandem gait was not attempted. Romberg is negative. No drift is seen.  Reflexes: Deep tendon reflexes are symmetric.   Assessment/Plan:  1. Thoracic myelopathy  2. Lumbosacral spinal stenosis  3. Gait disorder  The patient reports new left leg numbness that is intermittent in nature, unassociated with pain or any functional change in his ability to ambulate. The patient indicates that the numbness episodes are not daily. MRI evaluation of the thoracic and lumbosacral spine were recommended, but the patient is very claustrophobic, and he does not wish to pursue these studies at this time. The patient will contact me if he wishes to get these studies done. The patient will be referred for physical therapy for possible AFO braces do help  his ability to ambulate.  Jill Alexanders MD 03/13/2014 9:06 PM  [facility] [address] [place], [place] [address]  Phone [phone] Fax [phone]

## 2014-03-13 NOTE — Patient Instructions (Signed)

## 2014-03-14 ENCOUNTER — Other Ambulatory Visit: Payer: Self-pay | Admitting: Neurology

## 2014-05-02 ENCOUNTER — Other Ambulatory Visit: Payer: Self-pay | Admitting: Family Medicine

## 2014-05-02 DIAGNOSIS — N644 Mastodynia: Secondary | ICD-10-CM

## 2014-05-12 ENCOUNTER — Ambulatory Visit
Admission: RE | Admit: 2014-05-12 | Discharge: 2014-05-12 | Disposition: A | Discharge status: Home / Self Care | Payer: Medicare Other | Source: Ambulatory Visit | Attending: Family Medicine | Admitting: Family Medicine

## 2014-05-12 ENCOUNTER — Encounter (INDEPENDENT_AMBULATORY_CARE_PROVIDER_SITE_OTHER): Payer: Self-pay

## 2014-05-12 DIAGNOSIS — N644 Mastodynia: Secondary | ICD-10-CM

## 2014-06-06 ENCOUNTER — Other Ambulatory Visit: Payer: Self-pay | Admitting: Neurology

## 2014-07-10 ENCOUNTER — Inpatient Hospital Stay (HOSPITAL_COMMUNITY)
Admission: EM | Admit: 2014-07-10 | Discharge: 2014-07-18 | DRG: 870 | Disposition: A | Discharge status: Rehabilitation Facility | Payer: Medicare Other | Attending: Pulmonary Disease | Admitting: Pulmonary Disease

## 2014-07-10 ENCOUNTER — Inpatient Hospital Stay (HOSPITAL_COMMUNITY): Payer: Medicare Other

## 2014-07-10 ENCOUNTER — Encounter (HOSPITAL_COMMUNITY): Payer: Self-pay | Admitting: Emergency Medicine

## 2014-07-10 ENCOUNTER — Emergency Department (HOSPITAL_COMMUNITY): Payer: Medicare Other

## 2014-07-10 DIAGNOSIS — D649 Anemia, unspecified: Secondary | ICD-10-CM | POA: Diagnosis present

## 2014-07-10 DIAGNOSIS — E119 Type 2 diabetes mellitus without complications: Secondary | ICD-10-CM | POA: Diagnosis present

## 2014-07-10 DIAGNOSIS — G2581 Restless legs syndrome: Secondary | ICD-10-CM | POA: Diagnosis present

## 2014-07-10 DIAGNOSIS — J9601 Acute respiratory failure with hypoxia: Secondary | ICD-10-CM

## 2014-07-10 DIAGNOSIS — I5033 Acute on chronic diastolic (congestive) heart failure: Secondary | ICD-10-CM | POA: Diagnosis present

## 2014-07-10 DIAGNOSIS — J189 Pneumonia, unspecified organism: Secondary | ICD-10-CM | POA: Diagnosis present

## 2014-07-10 DIAGNOSIS — Z9989 Dependence on other enabling machines and devices: Secondary | ICD-10-CM

## 2014-07-10 DIAGNOSIS — N179 Acute kidney failure, unspecified: Secondary | ICD-10-CM | POA: Diagnosis not present

## 2014-07-10 DIAGNOSIS — I10 Essential (primary) hypertension: Secondary | ICD-10-CM | POA: Diagnosis present

## 2014-07-10 DIAGNOSIS — R600 Localized edema: Secondary | ICD-10-CM | POA: Diagnosis present

## 2014-07-10 DIAGNOSIS — E039 Hypothyroidism, unspecified: Secondary | ICD-10-CM | POA: Diagnosis present

## 2014-07-10 DIAGNOSIS — N390 Urinary tract infection, site not specified: Secondary | ICD-10-CM | POA: Diagnosis present

## 2014-07-10 DIAGNOSIS — Z981 Arthrodesis status: Secondary | ICD-10-CM

## 2014-07-10 DIAGNOSIS — D696 Thrombocytopenia, unspecified: Secondary | ICD-10-CM | POA: Diagnosis present

## 2014-07-10 DIAGNOSIS — J9600 Acute respiratory failure, unspecified whether with hypoxia or hypercapnia: Secondary | ICD-10-CM | POA: Diagnosis present

## 2014-07-10 DIAGNOSIS — G4733 Obstructive sleep apnea (adult) (pediatric): Secondary | ICD-10-CM | POA: Diagnosis present

## 2014-07-10 DIAGNOSIS — I5031 Acute diastolic (congestive) heart failure: Secondary | ICD-10-CM | POA: Diagnosis present

## 2014-07-10 DIAGNOSIS — N400 Enlarged prostate without lower urinary tract symptoms: Secondary | ICD-10-CM | POA: Diagnosis present

## 2014-07-10 DIAGNOSIS — G63 Polyneuropathy in diseases classified elsewhere: Secondary | ICD-10-CM | POA: Diagnosis present

## 2014-07-10 DIAGNOSIS — K5900 Constipation, unspecified: Secondary | ICD-10-CM | POA: Diagnosis present

## 2014-07-10 DIAGNOSIS — N170 Acute kidney failure with tubular necrosis: Secondary | ICD-10-CM | POA: Diagnosis present

## 2014-07-10 DIAGNOSIS — I509 Heart failure, unspecified: Secondary | ICD-10-CM | POA: Diagnosis present

## 2014-07-10 DIAGNOSIS — Z5189 Encounter for other specified aftercare: Secondary | ICD-10-CM | POA: Diagnosis not present

## 2014-07-10 DIAGNOSIS — M4712 Other spondylosis with myelopathy, cervical region: Secondary | ICD-10-CM | POA: Diagnosis present

## 2014-07-10 DIAGNOSIS — R6510 Systemic inflammatory response syndrome (SIRS) of non-infectious origin without acute organ dysfunction: Secondary | ICD-10-CM

## 2014-07-10 DIAGNOSIS — R609 Edema, unspecified: Secondary | ICD-10-CM

## 2014-07-10 DIAGNOSIS — I498 Other specified cardiac arrhythmias: Secondary | ICD-10-CM | POA: Diagnosis not present

## 2014-07-10 DIAGNOSIS — F411 Generalized anxiety disorder: Secondary | ICD-10-CM | POA: Diagnosis present

## 2014-07-10 DIAGNOSIS — E876 Hypokalemia: Secondary | ICD-10-CM | POA: Diagnosis present

## 2014-07-10 DIAGNOSIS — G609 Hereditary and idiopathic neuropathy, unspecified: Secondary | ICD-10-CM | POA: Diagnosis present

## 2014-07-10 DIAGNOSIS — J180 Bronchopneumonia, unspecified organism: Secondary | ICD-10-CM | POA: Diagnosis present

## 2014-07-10 DIAGNOSIS — A419 Sepsis, unspecified organism: Principal | ICD-10-CM | POA: Diagnosis present

## 2014-07-10 DIAGNOSIS — Z87891 Personal history of nicotine dependence: Secondary | ICD-10-CM | POA: Diagnosis not present

## 2014-07-10 DIAGNOSIS — K5904 Chronic idiopathic constipation: Secondary | ICD-10-CM | POA: Diagnosis present

## 2014-07-10 HISTORY — DX: Chronic idiopathic constipation: K59.04

## 2014-07-10 HISTORY — DX: Unspecified right bundle-branch block: I45.10

## 2014-07-10 LAB — POCT I-STAT 3, ART BLOOD GAS (G3+)
Acid-base deficit: 2 mmol/L (ref 0.0–2.0)
Bicarbonate: 23.8 mEq/L (ref 20.0–24.0)
O2 Saturation: 98 %
PH ART: 7.294 — AB (ref 7.350–7.450)
TCO2: 25 mmol/L (ref 0–100)
pCO2 arterial: 50.1 mmHg — ABNORMAL HIGH (ref 35.0–45.0)
pO2, Arterial: 120 mmHg — ABNORMAL HIGH (ref 80.0–100.0)

## 2014-07-10 LAB — COMPREHENSIVE METABOLIC PANEL
ALT: 29 U/L (ref 0–53)
ANION GAP: 15 (ref 5–15)
AST: 24 U/L (ref 0–37)
Albumin: 4.3 g/dL (ref 3.5–5.2)
Alkaline Phosphatase: 70 U/L (ref 39–117)
BUN: 31 mg/dL — AB (ref 6–23)
CALCIUM: 9.3 mg/dL (ref 8.4–10.5)
CO2: 24 meq/L (ref 19–32)
Chloride: 103 mEq/L (ref 96–112)
Creatinine, Ser: 1.12 mg/dL (ref 0.50–1.35)
GFR calc non Af Amer: 65 mL/min — ABNORMAL LOW (ref >90)
GFR, EST AFRICAN AMERICAN: 76 mL/min — AB (ref >90)
GLUCOSE: 139 mg/dL — AB (ref 70–99)
Potassium: 4.1 mEq/L (ref 3.7–5.3)
Sodium: 142 mEq/L (ref 137–147)
Total Bilirubin: 0.7 mg/dL (ref 0.3–1.2)
Total Protein: 7.4 g/dL (ref 6.0–8.3)

## 2014-07-10 LAB — HEMOGLOBIN A1C
HEMOGLOBIN A1C: 7 % — AB (ref <5.7)
Mean Plasma Glucose: 154 mg/dL — ABNORMAL HIGH (ref <117)

## 2014-07-10 LAB — CBC WITH DIFFERENTIAL/PLATELET
Basophils Absolute: 0 10*3/uL (ref 0.0–0.1)
Basophils Relative: 0 % (ref 0–1)
EOS ABS: 0.1 10*3/uL (ref 0.0–0.7)
EOS PCT: 1 % (ref 0–5)
HEMATOCRIT: 39 % (ref 39.0–52.0)
Hemoglobin: 13.2 g/dL (ref 13.0–17.0)
LYMPHS PCT: 4 % — AB (ref 12–46)
Lymphs Abs: 0.3 10*3/uL — ABNORMAL LOW (ref 0.7–4.0)
MCH: 30.3 pg (ref 26.0–34.0)
MCHC: 33.8 g/dL (ref 30.0–36.0)
MCV: 89.4 fL (ref 78.0–100.0)
MONO ABS: 0.3 10*3/uL (ref 0.1–1.0)
MONOS PCT: 4 % (ref 3–12)
Neutro Abs: 6.8 10*3/uL (ref 1.7–7.7)
Neutrophils Relative %: 91 % — ABNORMAL HIGH (ref 43–77)
Platelets: 149 10*3/uL — ABNORMAL LOW (ref 150–400)
RBC: 4.36 MIL/uL (ref 4.22–5.81)
RDW: 14 % (ref 11.5–15.5)
WBC: 7.4 10*3/uL (ref 4.0–10.5)

## 2014-07-10 LAB — MRSA PCR SCREENING: MRSA by PCR: NEGATIVE

## 2014-07-10 LAB — URINALYSIS, ROUTINE W REFLEX MICROSCOPIC
Bilirubin Urine: NEGATIVE
Glucose, UA: 100 mg/dL — AB
Ketones, ur: NEGATIVE mg/dL
Nitrite: NEGATIVE
PROTEIN: NEGATIVE mg/dL
Specific Gravity, Urine: 1.021 (ref 1.005–1.030)
UROBILINOGEN UA: 1 mg/dL (ref 0.0–1.0)
pH: 5 (ref 5.0–8.0)

## 2014-07-10 LAB — URINE MICROSCOPIC-ADD ON

## 2014-07-10 LAB — PROCALCITONIN: Procalcitonin: 2.9 ng/mL

## 2014-07-10 LAB — I-STAT CG4 LACTIC ACID, ED: Lactic Acid, Venous: 2.13 mmol/L (ref 0.5–2.2)

## 2014-07-10 LAB — TROPONIN I: Troponin I: 0.33 ng/mL (ref <0.30)

## 2014-07-10 LAB — PRO B NATRIURETIC PEPTIDE
PRO B NATRI PEPTIDE: 142.2 pg/mL — AB (ref 0–125)
PRO B NATRI PEPTIDE: 1750 pg/mL — AB (ref 0–125)

## 2014-07-10 LAB — TRIGLYCERIDES: TRIGLYCERIDES: 53 mg/dL (ref <150)

## 2014-07-10 LAB — I-STAT TROPONIN, ED: TROPONIN I, POC: 0.02 ng/mL (ref 0.00–0.08)

## 2014-07-10 LAB — STREP PNEUMONIAE URINARY ANTIGEN: Strep Pneumo Urinary Antigen: NEGATIVE

## 2014-07-10 LAB — GLUCOSE, CAPILLARY: Glucose-Capillary: 166 mg/dL — ABNORMAL HIGH (ref 70–99)

## 2014-07-10 MED ORDER — FUROSEMIDE 10 MG/ML IJ SOLN
INTRAMUSCULAR | Status: AC
  Administered 2014-07-10: 19:00:00
  Filled 2014-07-10: qty 4

## 2014-07-10 MED ORDER — DOCUSATE SODIUM 100 MG PO CAPS
100.0000 mg | ORAL_CAPSULE | Freq: Two times a day (BID) | ORAL | Status: DC
  Filled 2014-07-10: qty 1

## 2014-07-10 MED ORDER — FENTANYL CITRATE 0.05 MG/ML IJ SOLN
INTRAMUSCULAR | Status: AC
  Administered 2014-07-10: 100 ug via INTRAVENOUS
  Filled 2014-07-10: qty 2

## 2014-07-10 MED ORDER — LEVOTHYROXINE SODIUM 150 MCG PO TABS
150.0000 ug | ORAL_TABLET | Freq: Every day | ORAL | Status: DC
  Filled 2014-07-10: qty 1

## 2014-07-10 MED ORDER — FUROSEMIDE 10 MG/ML IJ SOLN
40.0000 mg | Freq: Once | INTRAMUSCULAR | Status: AC
  Administered 2014-07-10: 40 mg via INTRAVENOUS

## 2014-07-10 MED ORDER — FENTANYL BOLUS VIA INFUSION
25.0000 ug | INTRAVENOUS | Status: DC | PRN
  Administered 2014-07-12 (×8): 50 ug via INTRAVENOUS
  Filled 2014-07-10: qty 50

## 2014-07-10 MED ORDER — ONDANSETRON HCL 4 MG/2ML IJ SOLN: 4.0000 mg | Freq: Four times a day (QID) | INTRAMUSCULAR | Status: DC | PRN

## 2014-07-10 MED ORDER — SODIUM CHLORIDE 0.9 % IV SOLN: 1000.0000 mL | INTRAVENOUS | Status: DC

## 2014-07-10 MED ORDER — LEVOTHYROXINE SODIUM 150 MCG PO TABS
150.0000 ug | ORAL_TABLET | Freq: Every day | ORAL | Status: DC
  Administered 2014-07-11 – 2014-07-18 (×8): 150 ug
  Filled 2014-07-10 (×9): qty 1

## 2014-07-10 MED ORDER — MIDAZOLAM HCL 2 MG/2ML IJ SOLN
INTRAMUSCULAR | Status: AC
  Administered 2014-07-10: 2 mg via INTRAVENOUS
  Filled 2014-07-10: qty 2

## 2014-07-10 MED ORDER — SODIUM CHLORIDE 0.9 % IV BOLUS (SEPSIS)
500.0000 mL | Freq: Once | INTRAVENOUS | Status: AC
  Administered 2014-07-10: 500 mL via INTRAVENOUS

## 2014-07-10 MED ORDER — ETOMIDATE 2 MG/ML IV SOLN
INTRAVENOUS | Status: AC
  Administered 2014-07-10: 20 mg
  Filled 2014-07-10: qty 20

## 2014-07-10 MED ORDER — FENTANYL CITRATE 0.05 MG/ML IJ SOLN: 50.0000 ug | Freq: Once | INTRAMUSCULAR | Status: AC

## 2014-07-10 MED ORDER — PROPOFOL 10 MG/ML IV EMUL
0.0000 ug/kg/min | INTRAVENOUS | Status: DC
Start: 2014-07-10 — End: 2014-07-14
  Administered 2014-07-10: 40 ug/kg/min via INTRAVENOUS

## 2014-07-10 MED ORDER — FENTANYL CITRATE 0.05 MG/ML IJ SOLN
INTRAMUSCULAR | Status: DC
Start: 2014-07-10 — End: 2014-07-10
  Filled 2014-07-10: qty 2

## 2014-07-10 MED ORDER — DOXAZOSIN MESYLATE 4 MG PO TABS
4.0000 mg | ORAL_TABLET | Freq: Every day | ORAL | Status: DC
  Filled 2014-07-10: qty 1

## 2014-07-10 MED ORDER — SODIUM CHLORIDE 0.9 % IV SOLN
INTRAVENOUS | Status: DC
  Administered 2014-07-10: 21:00:00 via INTRAVENOUS

## 2014-07-10 MED ORDER — CHLORHEXIDINE GLUCONATE 0.12 % MT SOLN
15.0000 mL | Freq: Two times a day (BID) | OROMUCOSAL | Status: DC
  Administered 2014-07-11 – 2014-07-18 (×15): 15 mL via OROMUCOSAL
  Filled 2014-07-10 (×15): qty 15

## 2014-07-10 MED ORDER — INSULIN ASPART 100 UNIT/ML ~~LOC~~ SOLN
0.0000 [IU] | SUBCUTANEOUS | Status: DC
  Administered 2014-07-11 (×4): 4 [IU] via SUBCUTANEOUS
  Administered 2014-07-11: 3 [IU] via SUBCUTANEOUS
  Administered 2014-07-11 – 2014-07-12 (×2): 4 [IU] via SUBCUTANEOUS
  Administered 2014-07-12: 3 [IU] via SUBCUTANEOUS
  Administered 2014-07-12 (×4): 4 [IU] via SUBCUTANEOUS
  Administered 2014-07-13: 3 [IU] via SUBCUTANEOUS
  Administered 2014-07-13 (×2): 4 [IU] via SUBCUTANEOUS
  Administered 2014-07-13: 7 [IU] via SUBCUTANEOUS
  Administered 2014-07-13 – 2014-07-14 (×2): 4 [IU] via SUBCUTANEOUS
  Administered 2014-07-14 (×2): 7 [IU] via SUBCUTANEOUS
  Administered 2014-07-14: 4 [IU] via SUBCUTANEOUS
  Administered 2014-07-14: 7 [IU] via SUBCUTANEOUS
  Administered 2014-07-14: 4 [IU] via SUBCUTANEOUS
  Administered 2014-07-15: 11 [IU] via SUBCUTANEOUS
  Administered 2014-07-15: 4 [IU] via SUBCUTANEOUS
  Administered 2014-07-15 (×2): 7 [IU] via SUBCUTANEOUS
  Administered 2014-07-15: 11 [IU] via SUBCUTANEOUS
  Administered 2014-07-15 – 2014-07-16 (×3): 4 [IU] via SUBCUTANEOUS
  Administered 2014-07-16: 11 [IU] via SUBCUTANEOUS
  Administered 2014-07-16 (×2): 4 [IU] via SUBCUTANEOUS
  Administered 2014-07-16: 11 [IU] via SUBCUTANEOUS
  Administered 2014-07-16: 7 [IU] via SUBCUTANEOUS
  Administered 2014-07-17: 4 [IU] via SUBCUTANEOUS
  Administered 2014-07-17: 7 [IU] via SUBCUTANEOUS
  Administered 2014-07-17 (×2): 4 [IU] via SUBCUTANEOUS

## 2014-07-10 MED ORDER — OXYCODONE HCL 5 MG PO TABS: 5.0000 mg | ORAL_TABLET | ORAL | Status: DC | PRN

## 2014-07-10 MED ORDER — VITAL HIGH PROTEIN PO LIQD
1000.0000 mL | ORAL | Status: DC
  Filled 2014-07-10 (×2): qty 1000

## 2014-07-10 MED ORDER — ALUM & MAG HYDROXIDE-SIMETH 200-200-20 MG/5ML PO SUSP: 30.0000 mL | Freq: Four times a day (QID) | ORAL | Status: DC | PRN

## 2014-07-10 MED ORDER — GUAIFENESIN-DM 100-10 MG/5ML PO SYRP
5.0000 mL | ORAL_SOLUTION | ORAL | Status: DC | PRN
Start: 2014-07-10 — End: 2014-07-10
  Filled 2014-07-10: qty 5

## 2014-07-10 MED ORDER — SODIUM CHLORIDE 0.9 % IV SOLN: INTRAVENOUS | Status: DC

## 2014-07-10 MED ORDER — ZOLPIDEM TARTRATE 5 MG PO TABS: 5.0000 mg | ORAL_TABLET | Freq: Every evening | ORAL | Status: DC | PRN

## 2014-07-10 MED ORDER — PRAMIPEXOLE DIHYDROCHLORIDE 0.25 MG PO TABS
0.2500 mg | ORAL_TABLET | Freq: Two times a day (BID) | ORAL | Status: DC
  Filled 2014-07-10: qty 1

## 2014-07-10 MED ORDER — ALBUTEROL SULFATE (2.5 MG/3ML) 0.083% IN NEBU: 5.0000 mg | INHALATION_SOLUTION | RESPIRATORY_TRACT | Status: DC | PRN

## 2014-07-10 MED ORDER — IPRATROPIUM-ALBUTEROL 0.5-2.5 (3) MG/3ML IN SOLN
3.0000 mL | Freq: Once | RESPIRATORY_TRACT | Status: AC
  Administered 2014-07-10: 3 mL via RESPIRATORY_TRACT
  Filled 2014-07-10: qty 3

## 2014-07-10 MED ORDER — DEXTROSE 5 % IV SOLN
1.0000 g | INTRAVENOUS | Status: DC
  Administered 2014-07-10: 1 g via INTRAVENOUS
  Filled 2014-07-10: qty 10

## 2014-07-10 MED ORDER — SODIUM CHLORIDE 0.9 % IV SOLN
1000.0000 mL | Freq: Once | INTRAVENOUS | Status: AC
  Administered 2014-07-10: 1000 mL via INTRAVENOUS

## 2014-07-10 MED ORDER — DEXTROSE 5 % IV SOLN
500.0000 mg | INTRAVENOUS | Status: DC
  Administered 2014-07-11 – 2014-07-14 (×4): 500 mg via INTRAVENOUS
  Filled 2014-07-10 (×5): qty 500

## 2014-07-10 MED ORDER — ROCURONIUM BROMIDE 50 MG/5ML IV SOLN
INTRAVENOUS | Status: AC
  Administered 2014-07-10: 8 mg
  Filled 2014-07-10: qty 2

## 2014-07-10 MED ORDER — ACETAMINOPHEN 325 MG PO TABS
650.0000 mg | ORAL_TABLET | Freq: Four times a day (QID) | ORAL | Status: DC | PRN
  Administered 2014-07-10: 650 mg via ORAL
  Filled 2014-07-10: qty 2

## 2014-07-10 MED ORDER — INSULIN ASPART 100 UNIT/ML ~~LOC~~ SOLN
0.0000 [IU] | Freq: Three times a day (TID) | SUBCUTANEOUS | Status: DC
  Administered 2014-07-10: 2 [IU] via SUBCUTANEOUS

## 2014-07-10 MED ORDER — METOPROLOL TARTRATE 25 MG/10 ML ORAL SUSPENSION
25.0000 mg | Freq: Two times a day (BID) | ORAL | Status: DC
  Administered 2014-07-13 – 2014-07-14 (×2): 25 mg via ORAL
  Filled 2014-07-10 (×11): qty 10

## 2014-07-10 MED ORDER — SODIUM CHLORIDE 0.9 % IV SOLN
1000.0000 mL | INTRAVENOUS | Status: DC
Start: 2014-07-10 — End: 2014-07-10

## 2014-07-10 MED ORDER — SODIUM CHLORIDE 0.9 % IJ SOLN
3.0000 mL | Freq: Two times a day (BID) | INTRAMUSCULAR | Status: DC
  Administered 2014-07-10 – 2014-07-17 (×11): 3 mL via INTRAVENOUS

## 2014-07-10 MED ORDER — ONDANSETRON HCL 4 MG PO TABS: 4.0000 mg | ORAL_TABLET | Freq: Four times a day (QID) | ORAL | Status: DC | PRN

## 2014-07-10 MED ORDER — MIDAZOLAM HCL 2 MG/2ML IJ SOLN
1.0000 mg | INTRAMUSCULAR | Status: DC | PRN
  Administered 2014-07-10: 2 mg via INTRAVENOUS
  Filled 2014-07-10: qty 2

## 2014-07-10 MED ORDER — ACETAMINOPHEN 160 MG/5ML PO SOLN
650.0000 mg | Freq: Four times a day (QID) | ORAL | Status: DC | PRN
  Administered 2014-07-11 – 2014-07-16 (×10): 650 mg via ORAL
  Filled 2014-07-10 (×10): qty 20.3

## 2014-07-10 MED ORDER — SUCCINYLCHOLINE CHLORIDE 20 MG/ML IJ SOLN
INTRAMUSCULAR | Status: AC
  Administered 2014-07-10: 80 mg
  Filled 2014-07-10: qty 1

## 2014-07-10 MED ORDER — ENOXAPARIN SODIUM 40 MG/0.4ML ~~LOC~~ SOLN
40.0000 mg | SUBCUTANEOUS | Status: DC
  Administered 2014-07-11: 40 mg via SUBCUTANEOUS
  Filled 2014-07-10 (×3): qty 0.4

## 2014-07-10 MED ORDER — DEXTROSE 5 % IV SOLN
500.0000 mg | Freq: Once | INTRAVENOUS | Status: AC
  Administered 2014-07-10: 500 mg via INTRAVENOUS
  Filled 2014-07-10: qty 500

## 2014-07-10 MED ORDER — ALBUTEROL SULFATE (2.5 MG/3ML) 0.083% IN NEBU
2.5000 mg | INHALATION_SOLUTION | RESPIRATORY_TRACT | Status: DC | PRN
  Administered 2014-07-10 – 2014-07-16 (×5): 2.5 mg via RESPIRATORY_TRACT
  Filled 2014-07-10 (×5): qty 3

## 2014-07-10 MED ORDER — METOPROLOL SUCCINATE ER 25 MG PO TB24: 25.0000 mg | ORAL_TABLET | Freq: Every day | ORAL | Status: DC

## 2014-07-10 MED ORDER — ACETAMINOPHEN 500 MG PO TABS
1000.0000 mg | ORAL_TABLET | Freq: Once | ORAL | Status: AC
  Administered 2014-07-10: 1000 mg via ORAL
  Filled 2014-07-10: qty 2

## 2014-07-10 MED ORDER — MIDAZOLAM HCL 2 MG/2ML IJ SOLN
1.0000 mg | INTRAMUSCULAR | Status: DC | PRN
Start: 2014-07-10 — End: 2014-07-10
  Filled 2014-07-10: qty 2

## 2014-07-10 MED ORDER — SODIUM CHLORIDE 0.9 % IV SOLN
0.0000 ug/h | INTRAVENOUS | Status: DC
  Administered 2014-07-10: 100 ug/h via INTRAVENOUS
  Administered 2014-07-11: 200 ug/h via INTRAVENOUS
  Administered 2014-07-12: 350 ug/h via INTRAVENOUS
  Administered 2014-07-12 (×2): 50 ug/h via INTRAVENOUS
  Administered 2014-07-12 (×2): 250 ug/h via INTRAVENOUS
  Administered 2014-07-13: 200 ug/h via INTRAVENOUS
  Administered 2014-07-13: 50 ug/h via INTRAVENOUS
  Administered 2014-07-13: 300 ug/h via INTRAVENOUS
  Administered 2014-07-13: 50 ug/h via INTRAVENOUS
  Administered 2014-07-14: 200 ug/h via INTRAVENOUS
  Administered 2014-07-14 – 2014-07-16 (×4): 300 ug/h via INTRAVENOUS
  Filled 2014-07-10 (×14): qty 50

## 2014-07-10 MED ORDER — DOXAZOSIN MESYLATE 4 MG PO TABS
4.0000 mg | ORAL_TABLET | Freq: Every day | ORAL | Status: DC
  Administered 2014-07-11 – 2014-07-14 (×5): 4 mg
  Filled 2014-07-10 (×6): qty 1

## 2014-07-10 MED ORDER — PRAMIPEXOLE DIHYDROCHLORIDE 0.25 MG PO TABS
0.2500 mg | ORAL_TABLET | Freq: Two times a day (BID) | ORAL | Status: DC
  Administered 2014-07-11 – 2014-07-18 (×16): 0.25 mg
  Filled 2014-07-10 (×17): qty 1

## 2014-07-10 MED ORDER — GABAPENTIN 600 MG PO TABS
600.0000 mg | ORAL_TABLET | Freq: Three times a day (TID) | ORAL | Status: DC
  Administered 2014-07-11 (×4): 600 mg
  Filled 2014-07-10 (×7): qty 1

## 2014-07-10 MED ORDER — MORPHINE SULFATE 2 MG/ML IJ SOLN: 1.0000 mg | INTRAMUSCULAR | Status: DC | PRN

## 2014-07-10 MED ORDER — LIDOCAINE HCL (CARDIAC) 20 MG/ML IV SOLN
INTRAVENOUS | Status: AC
Start: 2014-07-10 — End: 2014-07-11
  Filled 2014-07-10: qty 5

## 2014-07-10 MED ORDER — BIOTENE DRY MOUTH MT LIQD
15.0000 mL | Freq: Four times a day (QID) | OROMUCOSAL | Status: DC
  Administered 2014-07-11 – 2014-07-18 (×29): 15 mL via OROMUCOSAL

## 2014-07-10 MED ORDER — MIDAZOLAM HCL 2 MG/2ML IJ SOLN
2.0000 mg | INTRAMUSCULAR | Status: DC | PRN
  Administered 2014-07-10 – 2014-07-15 (×17): 2 mg via INTRAVENOUS
  Filled 2014-07-10 (×16): qty 2

## 2014-07-10 MED ORDER — SODIUM CHLORIDE 0.9 % IV BOLUS (SEPSIS)
30.0000 mL/kg | Freq: Once | INTRAVENOUS | Status: AC
  Administered 2014-07-10: 1000 mL via INTRAVENOUS

## 2014-07-10 MED ORDER — FENTANYL CITRATE 0.05 MG/ML IJ SOLN
50.0000 ug | INTRAMUSCULAR | Status: DC | PRN
  Administered 2014-07-10: 50 ug via INTRAVENOUS
  Administered 2014-07-10: 100 ug via INTRAVENOUS
  Filled 2014-07-10: qty 2

## 2014-07-10 MED ORDER — GABAPENTIN 600 MG PO TABS
600.0000 mg | ORAL_TABLET | Freq: Three times a day (TID) | ORAL | Status: DC
  Administered 2014-07-10: 600 mg via ORAL
  Filled 2014-07-10 (×2): qty 1

## 2014-07-10 MED ORDER — PROPOFOL 10 MG/ML IV EMUL
INTRAVENOUS | Status: AC
  Administered 2014-07-10: 40 ug/kg/min via INTRAVENOUS
  Filled 2014-07-10: qty 100

## 2014-07-10 MED ORDER — DEXTROSE 5 % IV SOLN
1.0000 g | INTRAVENOUS | Status: DC
  Administered 2014-07-11 – 2014-07-16 (×6): 1 g via INTRAVENOUS
  Filled 2014-07-10 (×7): qty 10

## 2014-07-10 MED ORDER — PANTOPRAZOLE SODIUM 40 MG PO PACK
40.0000 mg | PACK | ORAL | Status: DC
  Administered 2014-07-11 – 2014-07-15 (×6): 40 mg
  Filled 2014-07-10 (×9): qty 20

## 2014-07-10 MED ORDER — ACETAMINOPHEN 650 MG RE SUPP: 650.0000 mg | Freq: Four times a day (QID) | RECTAL | Status: DC | PRN

## 2014-07-10 NOTE — ED Notes (Signed)
Attempted Report 

## 2014-07-10 NOTE — Progress Notes (Signed)
Informed by RN that patient became tachypneic and SOB around 6 pm. On exam-in obvious distress, given 2 rounds of albuterol nebs by RT-on auscualtation moving air bilaterally, but diminished air entry at both bases. Since had 2 + lower ext edema on admission, received IVF bolus down in the ED-suspect acute pulmonary edema. Given 1 dose of Lasix 40 mg IV, moved to SDU and started BiPAP. Await repeat CXR. Have consulted PCCM for assistance as well. Will monitor closely in SDU. Family updated.

## 2014-07-10 NOTE — Progress Notes (Signed)
RT note: Pt. transported without incident on V60 BiPAP from [place] to 2H05 for planned intubation, niteshift RT's aware, setting up Ventilator @ bedside.

## 2014-07-10 NOTE — H&P (Signed)
PATIENT DETAILS Name: Travis [name] Age: 68 y.o. Sex: male Date of Birth: [birth year] Admit Date: 07/10/2014 [id number] R, MD   CHIEF COMPLAINT:  Fever, Chills,Shortness of breath-1 day  HPI: Travis [name] is a 68 y.o. male with a Past Medical History of hypertension, hypothyroidism, obstructive sleep apnea on CPAP, type 2 diabetes on diet control who presents today with the above noted complaint. Per patient, he was in his usual state of health until this morning, when at work he started having fever along with chills. This was accompanied by weakness, some shortness of breath and generalized muscle aches. He was then brought to the emergency room, where he was found to have a fever of 101F, further evaluation with a chest x-ray revealed bronchopneumonia. I was asked to admit this patient for further evaluation and treatment Per patient, he was feeling slightly weak yesterday, but otherwise is doing okay. He denies any headache, chest pain, nausea, vomiting, diarrhea, abdominal pain. He also denies cough and dysuria. Per patient/daughter in law, patient has chronic lower extremity edema, the family thinks that it may have worsened slightly today.  ALLERGIES:   Allergies  Allergen Reactions  . Other Other (See Comments)    Anesthesia makes sick, must have antinausea medication in advance.    PAST MEDICAL HISTORY: Past Medical History  Diagnosis Date  . PONV (postoperative nausea and vomiting)   . Hypertension   . DJD (degenerative joint disease)     WHOLE SPINE  . Hyperthyroidism     Had Iodine to resolve issue DX 10 YR AGO  . Hypothyroidism     Low d/t treatment of hyperthyroidism  . Sleep apnea     DX  2009/2010 [facility] done at [facility]  . Type II diabetes mellitus     diet controlled  . Heart murmur     Evaluated by cardiologist 4-5 years ago but does not currently see a cardiologist  . Gait disorder   . Cervical myelopathy     with myelomalacia at C5-6   . RLS (restless legs syndrome)   . Peripheral neuropathy   . Lumbosacral spinal stenosis 03/13/2014    PAST SURGICAL HISTORY: Past Surgical History  Procedure Laterality Date  . Cholecystectomy    . Back surgery      6 surgeries  . Lumbar laminectomy/decompression microdiscectomy  07/14/2012    Procedure: LUMBAR LAMINECTOMY/DECOMPRESSION MICRODISCECTOMY 2 LEVELS;  Surgeon: Eustace Moore, MD;  Location: [facility];  Service: Neurosurgery;  Laterality: Left;  Lumbar two three laminectomy, left thoracic four five transpedicular diskectomy    MEDICATIONS AT HOME: Prior to Admission medications   Medication Sig Start Date End Date Taking? Authorizing Provider  ciclopirox (PENLAC) 8 % solution Apply 1 application topically at bedtime as needed (dry legs).  07/05/14  Yes Historical Provider, MD  diclofenac (VOLTAREN) 75 MG EC tablet Take 75 mg by mouth daily after lunch. About 4pm   Yes Historical Provider, MD  diphenhydrAMINE (BENADRYL) 25 MG tablet Take 25 mg by mouth at bedtime as needed for sleep.   Yes Historical Provider, MD  doxazosin (CARDURA) 4 MG tablet Take 4 mg by mouth at bedtime.   Yes Historical Provider, MD  gabapentin (NEURONTIN) 600 MG tablet Take 600 mg by mouth 3 (three) times daily.   Yes Historical Provider, MD  ibuprofen (ADVIL,MOTRIN) 200 MG tablet Take 400 mg by mouth at bedtime as needed for moderate pain.   Yes Historical Provider, MD  levothyroxine (SYNTHROID, LEVOTHROID) 150 MCG tablet Take 150 mcg by mouth daily.   Yes Historical Provider, MD  lisinopril-hydrochlorothiazide (PRINZIDE,ZESTORETIC) 20-12.5 MG per tablet Take 1 tablet by mouth every evening.    Yes Historical Provider, MD  metoprolol succinate (TOPROL-XL) 25 MG 24 hr tablet Take 25 mg by mouth daily.    Yes Historical Provider, MD  oxybutynin (DITROPAN) 5 MG tablet Take 5 mg by mouth daily as needed for bladder spasms.   Yes Historical Provider, MD  pramipexole (MIRAPEX) 0.125 MG tablet Take 0.25 mg by  mouth 2 (two) times daily.    Yes Historical Provider, MD    FAMILY HISTORY: Family History  Problem Relation Age of Onset  . Polycythemia Mother   . Diabetes Father   . Hypertension Father   . Heart disease Father     SOCIAL HISTORY:  reports that he quit smoking about 29 years ago. His smoking use included Cigarettes. He has a 40 pack-year smoking history. He quit smokeless tobacco use about 29 years ago. He reports that he does not drink alcohol or use illicit drugs.  REVIEW OF SYSTEMS:  Constitutional:   No  weight loss, night sweats.  HEENT:    No headaches, Difficulty swallowing,Tooth/dental problems,Sore throat,  No sneezing, itching, ear ache, nasal congestion, post nasal drip,   Cardio-vascular: No chest pain,  Orthopnea, PND, dizziness, palpitations  GI:  No heartburn, indigestion, abdominal pain, nausea, vomiting, diarrhea, change in   bowel habits, loss of appetite  Resp: No excess mucus, no productive cough, No non-productive cough,  No coughing up of blood.No change in color of mucus.No chest wall deformity  Skin:  no rash or lesions.  GU:  no dysuria, change in color of urine, no urgency or frequency.  No flank pain.  Musculoskeletal: No joint pain or swelling.  No decreased range of motion.  No back pain.  Psych: No change in mood or affect. No depression or anxiety.  No memory loss.   PHYSICAL EXAM: Blood pressure 134/71, pulse 101, temperature 98.3 F (36.8 C), temperature source Oral, resp. rate 28, height 6\' 1"  (1.854 m), weight 129.865 kg (286 lb 4.8 oz), SpO2 92.00%.  General appearance :Awake, alert, not in any distress. Speech Clear. Not toxic Looking HEENT: Atraumatic and Normocephalic, pupils equally reactive to light and accomodation Neck: supple, no JVD. No cervical lymphadenopathy.  Chest:Good air entry bilaterally, few bibasilar rales. CVS: S1 S2 regular, no murmurs.  Abdomen: Bowel sounds present, Non tender and not distended with  no gaurding, rigidity or rebound. Extremities: B/L Lower Ext shows 2+ edema, both legs are warm to touch Neurology:  Non focal Skin:No Rash Wounds:[name]  LABS ON ADMISSION:   Recent Labs  07/10/14 1255  NA 142  K 4.1  CL 103  CO2 24  GLUCOSE 139*  BUN 31*  CREATININE 1.12  CALCIUM 9.3    Recent Labs  07/10/14 1255  AST 24  ALT 29  ALKPHOS 70  BILITOT 0.7  PROT 7.4  ALBUMIN 4.3   No results found for this basename: LIPASE, AMYLASE,  in the last 72 hours  Recent Labs  07/10/14 1255  WBC 7.4  NEUTROABS 6.8  HGB 13.2  HCT 39.0  MCV 89.4  PLT 149*   No results found for this basename: CKTOTAL, CKMB, CKMBINDEX, TROPONINI,  in the last 72 hours No results found for this basename: DDIMER,  in the last 72 hours No components found with this basename: POCBNP,    RADIOLOGIC STUDIES  ON ADMISSION: Dg Chest Port 1 View  07/10/2014   CLINICAL DATA:  Fever  EXAM: PORTABLE CHEST - 1 VIEW  COMPARISON:  07/14/2012  FINDINGS: Low lung volumes are present, causing crowding of the pulmonary vasculature. Lower thoracic posterolateral rod and pedicle screw fixation noted.  Increased airspace opacity, indistinctly marginated, left lung base.  Atherosclerotic aortic arch.  IMPRESSION: 1. Indistinct airspace opacity at the left lung base, potentially from developing bronchopneumonia or underlying atelectasis. I favor the former. 2. Low lung volumes are present, causing crowding of the pulmonary vasculature.   Electronically Signed   By: Sherryl Barters M.D.   On: 07/10/2014 12:54    EKG: Independently reviewed. Sinus Tach-RBBB  ASSESSMENT AND PLAN: Present on Admission:  . Sepsis -likely secondary to PNA, may be UTI as well. -Given IVF boluses in ED, BP stable, tachycardia seems to have improved, continue with IVF cautiously (has leg edema) initially, continue IV Rocephin and Zithromax. Follow Blood and Urine cultures  . CAP (community acquired pneumonia) -likely causing  above -continue IV Rocephin and Zithromax. Follow Blood and Urine cultures  . UTI (lower urinary tract infection) -on Rocephin, which should cover-follow cultures  .Bilateral Lower Ext Edema  -etiology not apparent-UA neg for proteinuria, LFT's look ok, check Echo-although BNP not significantly elevated -hold diuretics for now, resuscitate with IVF and antibiotics for now, if continues to be stable, start diuretics from tomorrow.   . 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 HTN (hypertension) -continue Lopressor, hold HCTZ. Cautiously resume ACEI  . OSA - on CPAP-continue  Further plan will depend as patient's clinical course evolves and further radiologic and laboratory data become available. Patient will be monitored closely.  Above noted plan was discussed with patient/daughter in law, they were in agreement.   DVT Prophylaxis: Prophylactic Lovenox  Code Status: Full Code  Total time spent for admission equals 45 minutes.  [facility] Pager [phone]  If 7PM-7AM, please contact night-coverage [email or web address] Password [email or web address] 07/10/2014, 4:15 PM  **Disclaimer: This note may have been dictated with voice recognition software. Similar sounding words can inadvertently be transcribed and this note may contain transcription errors which may not have been corrected upon publication of note.**

## 2014-07-10 NOTE — Progress Notes (Addendum)
RT Rapid Response note: Pt. placed on V-60 BiPAP for transport to 2H27 from 5W15 @ 1900, initially called by pts. RN for prn nebulizer tx. after pt. arrived from ED, done ASAP @ 1801 then repeated @1831  after paging DR. Debbe Odea and getting T.O. for another STAT Albuterol nebulizer/CXR, RN made aware. Rapid Response called while in RT dept. getting V60 transport BiPAP per DR. Gmire's request @ pts. bedside, met team in [phone], transported to 8T37 with no complications, RT to monitor.

## 2014-07-10 NOTE — Progress Notes (Signed)
ARRIVED TO 5W15 AT THIS TIME, DAUGHTER IN LAW AT BEDSIDE, INSTRUCTED PT ON USAGE OF CALL LIGHT AND ROOM SURROUNDINGS, DENIES NAUSEA, C/O BACK PAIN.

## 2014-07-10 NOTE — ED Notes (Signed)
Patient family brought food for patient from [employer]. Patient is okay and eating.

## 2014-07-10 NOTE — ED Provider Notes (Signed)
CSN: [id number]     Arrival date & time 07/10/14  1152 History   First MD Initiated Contact with Patient 07/10/14 1222     Chief Complaint  Patient presents with  . Fever     (Consider location/radiation/quality/duration/timing/severity/associated sxs/prior Treatment) Patient is a 68 y.o. male presenting with fever. The history is provided by the patient and medical records.  Fever  This is a 68 y.o. 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  Pt states he was at work today, began feeling chilled and somewhat nauseated.  Thought maybe he just needed to use the restroom but sx not relieved with that.  States he continued working but continued feeling bad so went home.  Wife checked temp, was elevated and gave 2 advil.  Of note, wife reports over the past several days he has appeared increasingly short of breath. He has been using increased doses of his albuterol inhaler to help with this.  Some peripheral edema noted, states it has been improving from previous.  He denies any cough, fever, sore throat, or other URI symptoms. He states he has had some intermittent sweats prior to going to bed. He denies any chest pain, abdominal pain. He denies any urinary symptoms.  On arrival, pt remains febrile at 103F, tachypneic, mild hypoxia at 90% on RA.  Pt is not home O2, does use CPAP.  Past Medical History  Diagnosis Date  . PONV (postoperative nausea and vomiting)   . Hypertension   . DJD (degenerative joint disease)     WHOLE SPINE  . Hyperthyroidism     Had Iodine to resolve issue DX 10 YR AGO  . Hypothyroidism     Low d/t treatment of hyperthyroidism  . Sleep apnea     DX  2009/2010 study done at [facility]  . Type II diabetes mellitus     diet controlled  . Heart murmur     Evaluated by cardiologist 4-5 years ago but does not currently see a cardiologist  . Gait disorder   . Cervical myelopathy    with myelomalacia at C5-6  . RLS (restless legs syndrome)   . Peripheral neuropathy   . Lumbosacral spinal stenosis 03/13/2014   Past Surgical History  Procedure Laterality Date  . Cholecystectomy    . Back surgery      6 surgeries  . Lumbar laminectomy/decompression microdiscectomy  07/14/2012    Procedure: LUMBAR LAMINECTOMY/DECOMPRESSION MICRODISCECTOMY 2 LEVELS;  Surgeon: Eustace Moore, MD;  Location: [place] [facility];  Service: Neurosurgery;  Laterality: Left;  Lumbar two three laminectomy, left thoracic four five transpedicular diskectomy   Family History  Problem Relation Age of Onset  . Polycythemia Mother   . Diabetes Father   . Hypertension Father   . Heart disease Father    History  Substance Use Topics  . Smoking status: Former Smoker -- 2.00 packs/day for 20 years    Types: Cigarettes    Quit date: 06/28/1985  . Smokeless tobacco: Former Systems developer    Quit date: 06/28/1985  . Alcohol Use: No    Review of Systems  Constitutional: Positive for fever.  Respiratory: Positive for shortness of breath.   All other systems reviewed and are negative.     Allergies  Other  Home Medications   Prior to Admission medications   Medication Sig Start Date End Date Taking? Authorizing Provider  diclofenac (VOLTAREN) 75 MG EC tablet Take 75 mg  by mouth daily.    Historical Provider, MD  doxazosin (CARDURA) 4 MG tablet Take 4 mg by mouth at bedtime.    Historical Provider, MD  gabapentin (NEURONTIN) 600 MG tablet TAKE 1 TABLET BY MOUTH 3 TIMES A DAY 03/14/14   Kathrynn Ducking, MD  levothyroxine (SYNTHROID, LEVOTHROID) 150 MCG tablet Take 150 mcg by mouth daily.    Historical Provider, MD  lisinopril-hydrochlorothiazide (PRINZIDE,ZESTORETIC) 20-12.5 MG per tablet Take 2 tablets by mouth every evening.     Historical Provider, MD  metoprolol succinate (TOPROL-XL) 25 MG 24 hr tablet Take 25 mg by mouth every evening.     Historical Provider, MD  nabumetone (RELAFEN) 500 MG tablet Take  500 mg by mouth daily. 01/29/14   Historical Provider, MD  oxybutynin (DITROPAN) 5 MG tablet TAKE 1 TABLET (5 MG TOTAL) BY MOUTH DAILY. 06/06/14   Kathrynn Ducking, MD  pramipexole (MIRAPEX) 0.125 MG tablet Take 0.25 mg by mouth at bedtime.     Historical Provider, MD  tiZANidine (ZANAFLEX) 4 MG tablet Take 1 tablet (4 mg total) by mouth at bedtime. 07/18/13   Kathrynn Ducking, MD   BP 145/75  Pulse 110  Temp(Src) 101.6 F (38.7 C) (Oral)  Resp 30  Wt 285 lb (129.275 kg)  SpO2 90%  Physical Exam  Nursing note and vitals reviewed. Constitutional: He is oriented to person, place, and time. He appears well-developed and well-nourished. No distress.  HENT:  Head: Normocephalic and atraumatic.  Mouth/Throat: Oropharynx is clear and moist.  Eyes: Conjunctivae and EOM are normal. Pupils are equal, round, and reactive to light.  Neck: Normal range of motion. Neck supple.  Cardiovascular: Normal rate, regular rhythm and normal heart sounds.   Pulmonary/Chest: Breath sounds normal. Accessory muscle usage present. Tachypnea noted. No respiratory distress. He has no wheezes. He has no rhonchi.  tachypneic with mild accessory muscle usage, no audible wheezes or rhonchi, speaking in full complete sentences without difficulty  Abdominal: Soft. Bowel sounds are normal. There is no tenderness. There is no guarding.  Abdomen soft, nondistended, no focal tenderness or peritoneal signs  Musculoskeletal: Normal range of motion.  2+ pitting edema BLE  Neurological: He is alert and oriented to person, place, and time.  Skin: Skin is warm and dry. He is not diaphoretic.  Psychiatric: He has a normal mood and affect.    ED Course  Procedures (including critical care time) Labs Review Labs Reviewed  CBC WITH DIFFERENTIAL - Abnormal; Notable for the following:    Platelets 149 (*)    Neutrophils Relative % 91 (*)    Lymphocytes Relative 4 (*)    Lymphs Abs 0.3 (*)    All other components within normal  limits  COMPREHENSIVE METABOLIC PANEL - Abnormal; Notable for the following:    Glucose, Bld 139 (*)    BUN 31 (*)    GFR calc non Af Amer 65 (*)    GFR calc Af Amer 76 (*)    All other components within normal limits  URINALYSIS, ROUTINE W REFLEX MICROSCOPIC - Abnormal; Notable for the following:    APPearance HAZY (*)    Glucose, UA 100 (*)    Hgb urine dipstick SMALL (*)    Leukocytes, UA LARGE (*)    All other components within normal limits  PRO B NATRIURETIC PEPTIDE - Abnormal; Notable for the following:    Pro B Natriuretic peptide (BNP) 142.2 (*)    All other components within normal limits  URINE  MICROSCOPIC-ADD ON - Abnormal; Notable for the following:    Bacteria, UA MANY (*)    All other components within normal limits  CULTURE, BLOOD (ROUTINE X 2)  CULTURE, BLOOD (ROUTINE X 2)  URINE CULTURE  I-STAT CG4 LACTIC ACID, ED  Randolm Idol, ED    Imaging Review Dg Chest Port 1 View  07/10/2014   CLINICAL DATA:  Fever  EXAM: PORTABLE CHEST - 1 VIEW  COMPARISON:  07/14/2012  FINDINGS: Low lung volumes are present, causing crowding of the pulmonary vasculature. Lower thoracic posterolateral rod and pedicle screw fixation noted.  Increased airspace opacity, indistinctly marginated, left lung base.  Atherosclerotic aortic arch.  IMPRESSION: 1. Indistinct airspace opacity at the left lung base, potentially from developing bronchopneumonia or underlying atelectasis. I favor the former. 2. Low lung volumes are present, causing crowding of the pulmonary vasculature.   Electronically Signed   By: Sherryl Barters M.D.   On: 07/10/2014 12:54     EKG Interpretation None      MDM   Final diagnoses:  CAP (community acquired pneumonia)  SIRS (systemic inflammatory response syndrome)   68 year old male with SIRS criteria, code sepsis activated immediately on my initial evaluation.  Patient febrile, tachycardic, tachypnea, and mildly hypoxic. Blood pressure is stable.  Labs were  obtained, no leukocytosis and lactic acid within normal limits. Chest x-ray revealing left lower lobe pneumonia which appears to be his most likely source of infection.  U/a also with many bacteria.  Pt started on rocephin and azithromycin with blood and urine cultures pending.  HR has responded well to fluids, still mildly tachycardic.  Breathing improved with duoneb, patient still requiring supplemental oxygen.  Pt will require admission for further management.  Case discussed with Dr. Sloan Leiter who will admit to telemetry.  Temporary admission orders placed, patient remains hemodynamically stable at this time.  Larene Pickett, [name] 07/10/14 1551

## 2014-07-10 NOTE — ED Notes (Signed)
Was at work and started to feel bad felt flushed and chills felt nauseated and then vomited when he got has fver dneies dysuria feels bloated

## 2014-07-10 NOTE — Progress Notes (Signed)
[place] Progress Note Patient Name: Travis [name] DOB: 10-Jul-1946 MRN: [id number]  Date of Service  07/10/2014   HPI/Events of Note  CTSP for resp failure in pt with PNA and chf. Pt with increased wob on bipap  eICU Interventions  trf to ICU bed and electively intubate   Intervention Category Major Interventions: Respiratory failure - evaluation and management  Asencion Noble 07/10/2014, 7:41 PM

## 2014-07-10 NOTE — Procedures (Signed)
Intubation Procedure Note Travis [name] [id number] August 15, 1946  Procedure: Intubation Indications: Respiratory insufficiency  Procedure Details Consent: Risks of procedure as well as the alternatives and risks of each were explained to the (patient/caregiver).  Consent for procedure obtained. Time Out: Verified patient identification, verified procedure, site/side was marked, verified correct patient position, special equipment/implants available, medications/allergies/relevent history reviewed, required imaging and test results available.  Performed  Maximum sterile technique was used including gloves and hand hygiene.  MAC and 3  Inserted #8 ETT to 24 cm at lip.  Given 4 mg versed, 100 mcg fentanyl, 20 mg etomidate, 80 mg succinylcholine.  Placed with glidescope.  Evaluation Hemodynamic Status: BP stable throughout; O2 sats: transiently fell during during procedure Patient's Current Condition: stable Complications: No apparent complications Patient did tolerate procedure well. Chest X-ray ordered to verify placement.  CXR: pending.   Travis Mires, MD [facility] Pulmonary/Critical Care 07/10/2014, 8:49 PM Pager:  [phone] After 3pm call: [phone]

## 2014-07-10 NOTE — Consult Note (Addendum)
PULMONARY / CRITICAL CARE MEDICINE   Name: Travis [name] MRN: [id number] DOB: 09-May-1946    ADMISSION DATE:  07/10/2014 CONSULTATION DATE:  07/10/2014  REFERRING MD :  Sloan Leiter  CHIEF COMPLAINT:  Short of breath  BRIEF PATIENT DESCRIPTION:  68 yo male former smoker presented with fever (Tm 101F), chills, dyspnea, muscle aches x one day.  CXR showed pneumonia, and u/a suggested UTI.  He developed progressive respiratory failure requiring intubation.  PCCM assumed care in ICU.  SIGNIFICANT EVENTS: 7/13 Admit, failed BiPAP, intubated  STUDIES:    LINES / TUBES: ETT 7/13 >>   CULTURES: Blood 7/13 >> Sputum 7/13 >>  Urine 7/13 >>   ANTIBIOTICS: Rocephin 7/13 >> Zithromax 7/13 >>   HISTORY OF PRESENT ILLNESS:   68 yo male presented with fever, chills, dyspnea, and muscle aches.  He presented to ER and admit by hospitalist.  CXR showed pneumonia, and he was started on Abx.  He had progressive respiratory distress, and tried on BiPAP.  He continued to have tachypnea and paradoxical breathing.  As a result he required intubation.  Prior to intubation he denies chest pain, or abdominal pain.  He was having mild headache.  He denies nausea.  PAST MEDICAL HISTORY :  Past Medical History  Diagnosis Date  . PONV (postoperative nausea and vomiting)   . Hypertension   . DJD (degenerative joint disease)     WHOLE SPINE  . Hyperthyroidism     Had Iodine to resolve issue DX 10 YR AGO  . Hypothyroidism     Low d/t treatment of hyperthyroidism  . Sleep apnea     DX  2009/2010 study done at [facility]  . Type II diabetes mellitus     diet controlled  . Heart murmur     Evaluated by cardiologist 4-5 years ago but does not currently see a cardiologist  . Gait disorder   . Cervical myelopathy     with myelomalacia at C5-6  . RLS (restless legs syndrome)   . Peripheral neuropathy   . Lumbosacral spinal stenosis 03/13/2014   Past Surgical History  Procedure Laterality Date  .  Cholecystectomy    . Back surgery      6 surgeries  . Lumbar laminectomy/decompression microdiscectomy  07/14/2012    Procedure: LUMBAR LAMINECTOMY/DECOMPRESSION MICRODISCECTOMY 2 LEVELS;  Surgeon: Eustace Moore, MD;  Location: [place] [facility];  Service: Neurosurgery;  Laterality: Left;  Lumbar two three laminectomy, left thoracic four five transpedicular diskectomy   Prior to Admission medications   Medication Sig Start Date End Date Taking? Authorizing Provider  ciclopirox (PENLAC) 8 % solution Apply 1 application topically at bedtime as needed (dry legs).  07/05/14  Yes Historical Provider, MD  diclofenac (VOLTAREN) 75 MG EC tablet Take 75 mg by mouth daily after lunch. About 4pm   Yes Historical Provider, MD  diphenhydrAMINE (BENADRYL) 25 MG tablet Take 25 mg by mouth at bedtime as needed for sleep.   Yes Historical Provider, MD  doxazosin (CARDURA) 4 MG tablet Take 4 mg by mouth at bedtime.   Yes Historical Provider, MD  gabapentin (NEURONTIN) 600 MG tablet Take 600 mg by mouth 3 (three) times daily.   Yes Historical Provider, MD  ibuprofen (ADVIL,MOTRIN) 200 MG tablet Take 400 mg by mouth at bedtime as needed for moderate pain.   Yes Historical Provider, MD  levothyroxine (SYNTHROID, LEVOTHROID) 150 MCG tablet Take 150 mcg by mouth daily.   Yes Historical Provider, MD  lisinopril-hydrochlorothiazide (PRINZIDE,ZESTORETIC) 20-12.5 MG  per tablet Take 1 tablet by mouth every evening.    Yes Historical Provider, MD  metoprolol succinate (TOPROL-XL) 25 MG 24 hr tablet Take 25 mg by mouth daily.    Yes Historical Provider, MD  oxybutynin (DITROPAN) 5 MG tablet Take 5 mg by mouth daily as needed for bladder spasms.   Yes Historical Provider, MD  pramipexole (MIRAPEX) 0.125 MG tablet Take 0.25 mg by mouth 2 (two) times daily.    Yes Historical Provider, MD   Allergies  Allergen Reactions  . Other Other (See Comments)    Anesthesia makes sick, must have antinausea medication in advance.    FAMILY  HISTORY:  Family History  Problem Relation Age of Onset  . Polycythemia Mother   . Diabetes Father   . Hypertension Father   . Heart disease Father    SOCIAL HISTORY:  reports that he quit smoking about 29 years ago. His smoking use included Cigarettes. He has a 40 pack-year smoking history. He quit smokeless tobacco use about 29 years ago. He reports that he does not drink alcohol or use illicit drugs.  REVIEW OF SYSTEMS:   Negative except above.  SUBJECTIVE:   VITAL SIGNS: Temp:  [98.1 F (36.7 C)-103 F (39.4 C)] 98.3 F (36.8 C) (07/13 1611) Pulse Rate:  [99-121] 109 (07/13 2000) Resp:  [23-34] 23 (07/13 2000) BP: (121-169)/(47-91) 133/54 mmHg (07/13 2000) SpO2:  [90 %-100 %] 100 % (07/13 2000) Weight:  [285 lb (129.275 kg)-286 lb 4.8 oz (129.865 kg)] 286 lb 4.8 oz (129.865 kg) (07/13 1611) HEMODYNAMICS:   VENTILATOR SETTINGS:   INTAKE / OUTPUT: Intake/Output     07/13 0701 - 07/14 0700   I.V. (mL/kg) 2000 (15.4)   Total Intake(mL/kg) 2000 (15.4)   Urine (mL/kg/hr) 950   Total Output 950   Net +1050         PHYSICAL EXAMINATION: General: ill appearing Neuro:  Alert, follows commands, moves all extremities HEENT:  Pupils reactive, no sinus tenderness, no oral exudate, no LAN Cardiovascular:  Regular, tachycardic, no murmur Lungs:  Decreased BS rt base, scattered rhonchi, no wheeze Abdomen:  Soft, non tender, + bowel sounds Musculoskeletal:  1+ edema Skin:  No rashes  LABS:  CBC  Recent Labs Lab 07/10/14 1255  WBC 7.4  HGB 13.2  HCT 39.0  PLT 149*   Coag's No results found for this basename: APTT, INR,  in the last 168 hours  BMET  Recent Labs Lab 07/10/14 1255  NA 142  K 4.1  CL 103  CO2 24  BUN 31*  CREATININE 1.12  GLUCOSE 139*   Electrolytes  Recent Labs Lab 07/10/14 1255  CALCIUM 9.3   Sepsis Markers  Recent Labs Lab 07/10/14 1314  LATICACIDVEN 2.13   ABG No results found for this basename: PHART, PCO2ART, PO2ART,   in the last 168 hours  Liver Enzymes  Recent Labs Lab 07/10/14 1255  AST 24  ALT 29  ALKPHOS 70  BILITOT 0.7  ALBUMIN 4.3   Cardiac Enzymes  Recent Labs Lab 07/10/14 1255  PROBNP 142.2*   Glucose  Recent Labs Lab 07/10/14 1652  GLUCAP 166*    Imaging Dg Chest Port 1 View  07/10/2014   CLINICAL DATA:  Shortness of breath.  Respiratory distress.  EXAM: PORTABLE CHEST - 1 VIEW  COMPARISON:  07/10/2014  FINDINGS: Mildly elevated right hemidiaphragm. Low lung volumes are present, causing crowding of the pulmonary vasculature. Atherosclerotic aortic arch.  Lower thoracic posterolateral rod and pedicle screw  fixation.  Improved left basilar airspace opacity, although not entirely resolved.  IMPRESSION: 1. Improved left basilar airspace opacity, although not resolved. This may reflect some improvement in atelectasis. Underlying bronchopneumonia remains a diagnostic possibility.   Electronically Signed   By: Sherryl Barters M.D.   On: 07/10/2014 19:36   Dg Chest Port 1 View  07/10/2014   CLINICAL DATA:  Fever  EXAM: PORTABLE CHEST - 1 VIEW  COMPARISON:  07/14/2012  FINDINGS: Low lung volumes are present, causing crowding of the pulmonary vasculature. Lower thoracic posterolateral rod and pedicle screw fixation noted.  Increased airspace opacity, indistinctly marginated, left lung base.  Atherosclerotic aortic arch.  IMPRESSION: 1. Indistinct airspace opacity at the left lung base, potentially from developing bronchopneumonia or underlying atelectasis. I favor the former. 2. Low lung volumes are present, causing crowding of the pulmonary vasculature.   Electronically Signed   By: Sherryl Barters M.D.   On: 07/10/2014 12:54    ASSESSMENT / PLAN:  PULMONARY A: Acute hypoxic respiratory failure 2nd to CAP. Hx of OSA. P:   Full vent support until more stable F/u CXR, ABG PRN BD's  CARDIOVASCULAR A:  Sinus tachycardia 2nd to hypoxia. Hx of HTN. P:  Continue lopressor F/u  Echo  RENAL A:   Hx of BPH. P:   Monitor renal fx, urine outpt, electrolytes Continue cardura  GASTROINTESTINAL A:   Nutrition. P:   Tube feeds while on vent Protonix for SUP  HEMATOLOGIC A:   No acute issues. P:  F/u CBC Lovenox for DVT prevention  INFECTIOUS A:   CAP. Possible UTI. P:   Day 1 rocephin, zithromax  ENDOCRINE A:   Hx of DM type II. Hx of hypothyroidism. P:   SSI Continue synthroid  NEUROLOGIC A:   Sedation. Hx of RLS, peripheral neuropathy, cervical myelopathy. P:   RASS goal: -1 PAD 3 with diprivan and prn versed, fentanyl Continue neurontin, mirapex  Updated family at bedside.  CC time 50 minutes.  [name], MD [facility] Pulmonary/Critical Care 07/10/2014, 9:01 PM Pager:  [phone] After 3pm call: [phone]

## 2014-07-10 NOTE — ED Notes (Signed)
PA at bedside.

## 2014-07-11 ENCOUNTER — Inpatient Hospital Stay (HOSPITAL_COMMUNITY): Payer: Medicare Other

## 2014-07-11 DIAGNOSIS — N390 Urinary tract infection, site not specified: Secondary | ICD-10-CM

## 2014-07-11 LAB — CBC
HCT: 34.9 % — ABNORMAL LOW (ref 39.0–52.0)
Hemoglobin: 11.3 g/dL — ABNORMAL LOW (ref 13.0–17.0)
MCH: 29.7 pg (ref 26.0–34.0)
MCHC: 32.4 g/dL (ref 30.0–36.0)
MCV: 91.8 fL (ref 78.0–100.0)
PLATELETS: 132 10*3/uL — AB (ref 150–400)
RBC: 3.8 MIL/uL — AB (ref 4.22–5.81)
RDW: 14.7 % (ref 11.5–15.5)
WBC: 11.1 10*3/uL — AB (ref 4.0–10.5)

## 2014-07-11 LAB — URINE CULTURE: Colony Count: 25000

## 2014-07-11 LAB — POCT I-STAT 3, ART BLOOD GAS (G3+)
Acid-base deficit: 2 mmol/L (ref 0.0–2.0)
Acid-base deficit: 4 mmol/L — ABNORMAL HIGH (ref 0.0–2.0)
Bicarbonate: 22.2 mEq/L (ref 20.0–24.0)
Bicarbonate: 20.6 mEq/L (ref 20.0–24.0)
O2 SAT: 99 %
O2 Saturation: 96 %
Patient temperature: 99.7
TCO2: 23 mmol/L (ref 0–100)
TCO2: 22 mmol/L (ref 0–100)
pCO2 arterial: 35.3 mmHg (ref 35.0–45.0)
pCO2 arterial: 36.6 mmHg (ref 35.0–45.0)
pH, Arterial: 7.41 (ref 7.350–7.450)
pH, Arterial: 7.364 (ref 7.350–7.450)
pO2, Arterial: 120 mmHg — ABNORMAL HIGH (ref 80.0–100.0)
pO2, Arterial: 89 mmHg (ref 80.0–100.0)

## 2014-07-11 LAB — BASIC METABOLIC PANEL
ANION GAP: 18 — AB (ref 5–15)
BUN: 33 mg/dL — ABNORMAL HIGH (ref 6–23)
CO2: 21 mEq/L (ref 19–32)
Calcium: 8.2 mg/dL — ABNORMAL LOW (ref 8.4–10.5)
Chloride: 103 mEq/L (ref 96–112)
Creatinine, Ser: 1.8 mg/dL — ABNORMAL HIGH (ref 0.50–1.35)
GFR calc Af Amer: 43 mL/min — ABNORMAL LOW (ref >90)
GFR, EST NON AFRICAN AMERICAN: 37 mL/min — AB (ref >90)
GLUCOSE: 188 mg/dL — AB (ref 70–99)
Potassium: 4.3 mEq/L (ref 3.7–5.3)
SODIUM: 142 meq/L (ref 137–147)

## 2014-07-11 LAB — GLUCOSE, CAPILLARY
GLUCOSE-CAPILLARY: 168 mg/dL — AB (ref 70–99)
GLUCOSE-CAPILLARY: 160 mg/dL — AB (ref 70–99)
Glucose-Capillary: 175 mg/dL — ABNORMAL HIGH (ref 70–99)
Glucose-Capillary: 145 mg/dL — ABNORMAL HIGH (ref 70–99)
Glucose-Capillary: 184 mg/dL — ABNORMAL HIGH (ref 70–99)
Glucose-Capillary: 179 mg/dL — ABNORMAL HIGH (ref 70–99)

## 2014-07-11 LAB — LEGIONELLA ANTIGEN, URINE: LEGIONELLA ANTIGEN, URINE: NEGATIVE

## 2014-07-11 LAB — HIV ANTIBODY (ROUTINE TESTING W REFLEX): HIV 1&2 Ab, 4th Generation: NONREACTIVE

## 2014-07-11 MED ORDER — DEXTROSE 5 % IV SOLN
2.0000 ug/min | INTRAVENOUS | Status: DC
  Administered 2014-07-11: 12 ug/min via INTRAVENOUS
  Administered 2014-07-12: 8 ug/min via INTRAVENOUS
  Administered 2014-07-14: 5 ug/min via INTRAVENOUS
  Filled 2014-07-11 (×3): qty 16

## 2014-07-11 MED ORDER — VITAL HIGH PROTEIN PO LIQD
1000.0000 mL | ORAL | Status: DC
  Filled 2014-07-11 (×2): qty 1000

## 2014-07-11 MED ORDER — SODIUM CHLORIDE 0.9 % IV BOLUS (SEPSIS)
250.0000 mL | Freq: Once | INTRAVENOUS | Status: AC
  Administered 2014-07-11: 250 mL via INTRAVENOUS

## 2014-07-11 MED ORDER — NOREPINEPHRINE BITARTRATE 1 MG/ML IV SOLN
2.0000 ug/min | INTRAVENOUS | Status: DC
  Administered 2014-07-11: 5 ug/min via INTRAVENOUS
  Filled 2014-07-11: qty 4

## 2014-07-11 NOTE — Progress Notes (Signed)
PULMONARY / CRITICAL CARE MEDICINE   Name: Travis [name] MRN: [id number] DOB: [birth year]    ADMISSION DATE:  07/10/2014 CONSULTATION DATE:  07/10/2014  REFERRING MD :  Sloan Leiter  CHIEF COMPLAINT:  Short of breath  BRIEF PATIENT DESCRIPTION:  68 yo male former smoker presented with fever (Tm 101F), chills, dyspnea, muscle aches x one day.  CXR showed pneumonia, and u/a suggested UTI.  He developed progressive respiratory failure requiring intubation.  PCCM assumed care in ICU.  SIGNIFICANT EVENTS: 7/13 Admit, failed BiPAP, intubated  STUDIES:  CXR 7/14 >>mild bibasilar atelectasis, no pneumothorax or pleural effusion   LINES / TUBES: ETT 7/13 >>   CULTURES: Blood 7/13 >> Sputum 7/13 >>  Urine 7/13 >>   ANTIBIOTICS: Rocephin 7/13 >> Zithromax 7/13 >>    SUBJECTIVE:   VITAL SIGNS: Temp:  [98.1 F (36.7 C)-103 F (39.4 C)] 102 F (38.9 C) (07/14 0756) Pulse Rate:  [71-121] 78 (07/14 0900) Resp:  [16-34] 18 (07/14 0900) BP: (70-175)/(35-91) 140/45 mmHg (07/14 0800) SpO2:  [90 %-100 %] 97 % (07/14 0900) FiO2 (%):  [60 %-100 %] 60 % (07/14 0845) Weight:  [285 lb (129.275 kg)-286 lb 4.8 oz (129.865 kg)] 285 lb 0.9 oz (129.3 kg) (07/14 0420) HEMODYNAMICS:   VENTILATOR SETTINGS: Vent Mode:  [-] PRVC FiO2 (%):  [60 %-100 %] 60 % Set Rate:  [15 bmp-18 bmp] 15 bmp Vt Set:  [640 mL] 640 mL PEEP:  [5 cmH20] 5 cmH20 Plateau Pressure:  [23 cmH20] 23 cmH20 INTAKE / OUTPUT: Intake/Output     07/13 0701 - 07/14 0700 07/14 0701 - 07/15 0700   I.V. (mL/kg) 2371.3 (18.3)    IV Piggyback 500    Total Intake(mL/kg) 2871.3 (22.2)    Urine (mL/kg/hr) 1550    Emesis/NG output 350    Total Output 1900     Net +971.3            PHYSICAL EXAMINATION: General: ill appearing feverish obese male Neuro:  Alert, follows commands, moves all extremities HEENT:  Pupils reactive, no sinus tenderness, no oral exudate, no LAN Cardiovascular:  Regular, tachycardic, no murmur Lungs:   Decreased BS rt base, scattered rhonchi, no wheeze Abdomen:  Soft, non tender, normoactive bowel sounds Musculoskeletal:  1+ edema in LE bilaterally Skin:  No rashes  LABS:  CBC  Recent Labs Lab 07/10/14 1255 07/11/14 0255  WBC 7.4 11.1*  HGB 13.2 11.3*  HCT 39.0 34.9*  PLT 149* 132*   Coag's No results found for this basename: APTT, INR,  in the last 168 hours  BMET  Recent Labs Lab 07/10/14 1255 07/11/14 0255  NA 142 142  K 4.1 4.3  CL 103 103  CO2 24 21  BUN 31* 33*  CREATININE 1.12 1.80*  GLUCOSE 139* 188*   Electrolytes  Recent Labs Lab 07/10/14 1255 07/11/14 0255  CALCIUM 9.3 8.2*   Sepsis Markers  Recent Labs Lab 07/10/14 1314 07/10/14 2105  LATICACIDVEN 2.13  --   PROCALCITON  --  2.90   ABG  Recent Labs Lab 07/10/14 2104  PHART 7.294*  PCO2ART 50.1*  PO2ART 120.0*    Liver Enzymes  Recent Labs Lab 07/10/14 1255  AST 24  ALT 29  ALKPHOS 70  BILITOT 0.7  ALBUMIN 4.3   Cardiac Enzymes  Recent Labs Lab 07/10/14 1255 07/10/14 2105  TROPONINI  --  0.33*  PROBNP 142.2* 1750.0*   Glucose  Recent Labs Lab 07/10/14 1652 07/11/14 0154 07/11/14 0428 07/11/14 0758  GLUCAP 166* 175* 168* 160*    Imaging Dg Chest Port 1 View  07/11/2014   CLINICAL DATA:  History of pneumonia.  Intubated patient.  EXAM: PORTABLE CHEST - 1 VIEW  COMPARISON:  Single view of the chest 07/10/2014.  FINDINGS: Endotracheal tube remains in place. Lung volumes are low with mild basilar atelectasis. No pneumothorax or pleural effusion. Heart size is upper normal.  IMPRESSION: ET tube in good position.  Subsegmental basilar atelectasis in a low volume chest.   Electronically Signed   By: Inge Rise M.D.   On: 07/11/2014 08:03   Dg Chest Port 1 View  07/10/2014   CLINICAL DATA:  Hypoxia  EXAM: PORTABLE CHEST - 1 VIEW  COMPARISON:  Study obtained earlier in the day  FINDINGS: Endotracheal tube tip is 4.6 cm above carina. No pneumothorax. The degree  of inspiration is somewhat shallow on this study. There is no edema or consolidation. Heart is upper normal in size with normal pulmonary vascularity. No adenopathy. There is postoperative change in the cervical and lower thoracic regions.  IMPRESSION: No edema or consolidation. Endotracheal tube as described without apparent pneumothorax.   Electronically Signed   By: Lowella Grip M.D.   On: 07/10/2014 21:04   Dg Chest Port 1 View  07/10/2014   CLINICAL DATA:  Shortness of breath.  Respiratory distress.  EXAM: PORTABLE CHEST - 1 VIEW  COMPARISON:  07/10/2014  FINDINGS: Mildly elevated right hemidiaphragm. Low lung volumes are present, causing crowding of the pulmonary vasculature. Atherosclerotic aortic arch.  Lower thoracic posterolateral rod and pedicle screw fixation.  Improved left basilar airspace opacity, although not entirely resolved.  IMPRESSION: 1. Improved left basilar airspace opacity, although not resolved. This may reflect some improvement in atelectasis. Underlying bronchopneumonia remains a diagnostic possibility.   Electronically Signed   By: Sherryl Barters M.D.   On: 07/10/2014 19:36   Dg Chest Port 1 View  07/10/2014   CLINICAL DATA:  Fever  EXAM: PORTABLE CHEST - 1 VIEW  COMPARISON:  07/14/2012  FINDINGS: Low lung volumes are present, causing crowding of the pulmonary vasculature. Lower thoracic posterolateral rod and pedicle screw fixation noted.  Increased airspace opacity, indistinctly marginated, left lung base.  Atherosclerotic aortic arch.  IMPRESSION: 1. Indistinct airspace opacity at the left lung base, potentially from developing bronchopneumonia or underlying atelectasis. I favor the former. 2. Low lung volumes are present, causing crowding of the pulmonary vasculature.   Electronically Signed   By: Sherryl Barters M.D.   On: 07/10/2014 12:54   Dg Abd Portable 1v  07/11/2014   CLINICAL DATA:  Orogastric tube placed  EXAM: PORTABLE ABDOMEN - 1 VIEW  COMPARISON:   07/10/2014  FINDINGS: An orogastric tube tip is at the level of the proximal stomach. There is less gaseous distention of the stomach compared to yesterday. The visualized bowel gas pattern is nonobstructive. Cholecystectomy clips noted.  IMPRESSION: Orogastric tube tip at the proximal stomach. Advancement by 7 cm would provide more stable positioning.   Electronically Signed   By: Jorje Guild M.D.   On: 07/11/2014 06:57   Dg Abd Portable 1v  07/10/2014   CLINICAL DATA:  OG placement  EXAM: PORTABLE ABDOMEN - 1 VIEW  COMPARISON:  Prior radiograph from 07/14/2012  FINDINGS: Gaseous distention of the stomach noted. An enteric tube is in place with tip located in the gastric antrum and side hole and mean distal gastric body. The tip projects towards the pylorus.  No evidence of bowel obstruction.  Spinal fixation hardware partially visualized.  IMPRESSION: 1. Enteric tube in place within the stomach with tip in the gastric antrum and side hole in the distal gastric body. 2. Gaseous distention of the stomach.   Electronically Signed   By: Jeannine Boga M.D.   On: 07/10/2014 23:23    ASSESSMENT / PLAN:  PULMONARY A: Acute hypoxic respiratory failure 2nd to CAP. Hx of OSA. P:   Full vent support until more stable F/u CXR, ABG PRN BD's Place A-line then repeat ABG. Increase PEEP to 10 and RR to 24 with f/u ABG  CARDIOVASCULAR A:  Sinus tachycardia 2nd to hypoxia. Hx of HTN. P:  D/C lopressor Place TLC Follow CVP Start levophed F/u Echo  RENAL A:   Hx of BPH.  Cr deteriorating with renal function P:   Monitor renal fx, urine outpt, electrolytes D/C cardura  GASTROINTESTINAL A:   Nutrition. P:   Tube feeds while on vent Protonix for SUP  HEMATOLOGIC A:   No acute issues. P:  F/u CBC Lovenox for DVT prevention  INFECTIOUS A:   CAP. Possible UTI. P:   Day 2 rocephin, zithromax  ENDOCRINE A:   Hx of DM type II. Hx of hypothyroidism. P:   SSI Continue  synthroid  NEUROLOGIC A:   Sedation. Hx of RLS, peripheral neuropathy, cervical myelopathy. P:   RASS goal: -1 PAD 3 with prn versed, fentanyl drip Continue neurontin, mirapex  Updated family at bedside.  CC time 35 minutes.  Above note edited in full, patient seen and examined, I have dictated plan of care for patient.  Rush Farmer, M.D. [facility] Pulmonary/Critical Care Medicine. Pager: [phone]. After hours pager: [phone].  07/11/2014, 8:57 AM

## 2014-07-11 NOTE — Progress Notes (Signed)
CRITICAL VALUE ALERT  Critical value received:  Troponin 0.33  Date of notification:  07/10/14  Time of notification:  22:09  Critical value read back:Yes.    Nurse who received alert:  [place] Lyann Hagstrom, RN  MD notified (1st page):  Dr. Joya Gaskins  Time of first page:  22:30  MD notified (2nd page):  Time of second page:  Responding MD:  Dr. Joya Gaskins   Time MD responded:  22:30

## 2014-07-11 NOTE — Procedures (Signed)
Arterial Catheter Insertion Procedure Note LAVARIUS LEBSACK [id number] [birth year]  Procedure: Insertion of Arterial Catheter  Indications: Blood pressure monitoring and Frequent blood sampling  Procedure Details Consent: Risks of procedure as well as the alternatives and risks of each were explained to the (patient/caregiver).  Consent for procedure obtained. Time Out: Verified patient identification, verified procedure, site/side was marked, verified correct patient position, special equipment/implants available, medications/allergies/relevent history reviewed, required imaging and test results available.  Performed  Maximum sterile technique was used including antiseptics, cap, gloves, gown, hand hygiene, mask and sheet. Skin prep: Chlorhexidine; local anesthetic administered 20 gauge catheter was inserted into left radial artery using the Seldinger technique.  Evaluation Blood flow good; BP tracing good. Complications: No apparent complications.   Jennet Maduro 07/11/2014

## 2014-07-11 NOTE — Progress Notes (Signed)
Sputum obtained from ETT and sent to Lab by RN.

## 2014-07-11 NOTE — Care Management Note (Addendum)
    Page 1 of 1   07/17/2014     1:51:39 PM CARE MANAGEMENT NOTE 07/17/2014  Patient:  Travis [name]   Account Number:  [id number]  Date Initiated:  07/11/2014  Documentation initiated by:  Elissa Hefty  Subjective/Objective Assessment:   adm w sepsis-vent     Action/Plan:   lives w wife, pcp dr Travis [name]   Anticipated DC Date:     Anticipated DC Plan:  [place]  CM consult      [facility] Choice  IP REHAB   Choice offered to / List presented to:             Status of service:  Completed, signed off Medicare Important Message given?  YES (If response is "NO", the following Medicare IM given date fields will be blank) Date Medicare IM given:  07/14/2014 Medicare IM given by:  [facility] Date Additional Medicare IM given:  07/17/2014 Additional Medicare IM given by:  [facility] Nashali Ditmer  Discharge Disposition:    Per UR Regulation:  Reviewed for med. necessity/level of care/duration of stay  If discussed at [place] of Stay Meetings, dates discussed:   07/18/2014    Comments:  Contact:  Travis [name] [phone]

## 2014-07-11 NOTE — Procedures (Signed)
Central Venous Catheter Insertion Procedure Note AGAMVEER DANNEMILLER [id number] 06-Aug-1946  Procedure: Insertion of Central Venous Catheter Indications: Assessment of intravascular volume, Drug and/or fluid administration and Frequent blood sampling  Procedure Details Consent: Risks of procedure as well as the alternatives and risks of each were explained to the (patient/caregiver).  Consent for procedure obtained. Time Out: Verified patient identification, verified procedure, site/side was marked, verified correct patient position, special equipment/implants available, medications/allergies/relevent history reviewed, required imaging and test results available.  Performed  Maximum sterile technique was used including antiseptics, cap, gloves, gown, hand hygiene, mask and sheet. Skin prep: Chlorhexidine; local anesthetic administered A antimicrobial bonded/coated triple lumen catheter was placed in the left internal jugular vein using the Seldinger technique.  U/S used in placement.  Evaluation Blood flow good Complications: No apparent complications Patient did tolerate procedure well. Chest X-ray ordered to verify placement.  CXR: pending.  YACOUB,WESAM 07/11/2014, 12:27 PM

## 2014-07-11 NOTE — Progress Notes (Signed)
INITIAL NUTRITION ASSESSMENT  DOCUMENTATION CODES Per approved criteria  -Obesity Unspecified   INTERVENTION: Once ready to resume feedings, initiate Vital High Protein at 25 ml/hr, and advance by 10 ml q 4 hours, to goal of 75 ml/hr. Goal regimen will provide: 1800 kcal (66% of estimated kcal needs), 158 grams protein (100% estimated protein needs), and 1505 ml free water. RD to continue to follow nutrition care plan.  NUTRITION DIAGNOSIS: Inadequate oral intake related to inability to eat as evidenced by NPO status.   Goal: Enteral nutrition to provide 60-70% of estimated calorie needs (22-25 kcals/kg ideal body weight) and 100% of estimated protein needs, based on ASPEN guidelines for permissive underfeeding in critically ill obese individuals.  Monitor:  weight trends, lab trends, I/O's, vent status/settings, tolerance of enteral nutrition  Reason for Assessment: MD Consult for TF Initiation  68 y.o. male  Admitting Dx: Sepsis  ASSESSMENT: PMHx significant for HTN, hypothyroidism, OSA on CPAP, DM2. Admitted with fevers, chills and SOB x 1 day. Work-up reveals sepsis 2/2  bronchopneumonia.  Intubated 7/13 secondary to progressive respiratory failure.  Patient is currently intubated on ventilator support MV: 15 L/min Temp (24hrs), Avg:100.9 F (38.3 C), Min:98.1 F (36.7 C), Max:103 F (39.4 C)  Propofol: none  RD consulted to initiate nutrition support. Per RN, pt with residuals of 350 ml this morning at 0500 and tube feeding is currently on hold. RN is awaiting for MD to round to advise.  Potassium WNL Trig WNL  Height: Ht Readings from Last 1 Encounters:  07/11/14 6\' 1"  (1.854 m)    Weight: Wt Readings from Last 1 Encounters:  07/11/14 285 lb 0.9 oz (129.3 kg)    Ideal Body Weight: 184 lb/83.6 kg  % Ideal Body Weight: 155%  Wt Readings from Last 10 Encounters:  07/11/14 285 lb 0.9 oz (129.3 kg)  03/13/14 289 lb (131.09 kg)  07/18/13 283 lb (128.368  kg)  02/17/13 272 lb (123.378 kg)  07/15/12 281 lb 12 oz (127.8 kg)  07/15/12 281 lb 12 oz (127.8 kg)  07/13/12 275 lb 8 oz (124.966 kg)  02/11/12 265 lb (120.203 kg)  02/11/12 265 lb (120.203 kg)  02/03/12 267 lb 9.6 oz (121.383 kg)    Usual Body Weight: 280 lb  % Usual Body Weight: 102%  BMI:  Body mass index is 37.62 kg/(m^2). Obese Class II  Estimated Nutritional Needs: Kcal: 2723 Protein: 155 - 175 g Fluid: per MD  Skin: intact  Diet Order: NPO  EDUCATION NEEDS: -No education needs identified at this time   Intake/Output Summary (Last 24 hours) at 07/11/14 0952 Last data filed at 07/11/14 0900  Gross per 24 hour  Intake 2961.26 ml  Output   1900 ml  Net 1061.26 ml    Last BM: 7/13  Labs:   Recent Labs Lab 07/10/14 1255 07/11/14 0255  NA 142 142  K 4.1 4.3  CL 103 103  CO2 24 21  BUN 31* 33*  CREATININE 1.12 1.80*  CALCIUM 9.3 8.2*  GLUCOSE 139* 188*    CBG (last 3)   Recent Labs  07/11/14 0154 07/11/14 0428 07/11/14 0758  GLUCAP 175* 168* 160*    Scheduled Meds: . antiseptic oral rinse  15 mL Mouth Rinse QID  . azithromycin  500 mg Intravenous Q24H  . cefTRIAXone (ROCEPHIN)  IV  1 g Intravenous Q24H  . chlorhexidine  15 mL Mouth Rinse BID  . doxazosin  4 mg Per Tube QHS  . enoxaparin (LOVENOX) injection  40 mg Subcutaneous Q24H  . feeding supplement (VITAL HIGH PROTEIN)  1,000 mL Per Tube Q24H  . gabapentin  600 mg Per Tube TID  . insulin aspart  0-20 Units Subcutaneous 6 times per day  . levothyroxine  150 mcg Per Tube QAC breakfast  . metoprolol tartrate  25 mg Oral BID  . pantoprazole sodium  40 mg Per Tube Q24H  . pramipexole  0.25 mg Per Tube BID  . sodium chloride  3 mL Intravenous Q12H    Continuous Infusions: . sodium chloride 30 mL/hr at 07/10/14 2100  . fentaNYL infusion INTRAVENOUS 200 mcg/hr (07/11/14 0900)  . propofol Stopped (07/10/14 2230)    Past Medical History  Diagnosis Date  . PONV (postoperative  nausea and vomiting)   . Hypertension   . DJD (degenerative joint disease)     WHOLE SPINE  . Hyperthyroidism     Had Iodine to resolve issue DX 10 YR AGO  . Hypothyroidism     Low d/t treatment of hyperthyroidism  . Sleep apnea     DX  2009/2010 study done at [facility]  . Type II diabetes mellitus     diet controlled  . Heart murmur     Evaluated by cardiologist 4-5 years ago but does not currently see a cardiologist  . Gait disorder   . Cervical myelopathy     with myelomalacia at C5-6  . RLS (restless legs syndrome)   . Peripheral neuropathy   . Lumbosacral spinal stenosis 03/13/2014    Past Surgical History  Procedure Laterality Date  . Cholecystectomy    . Back surgery      6 surgeries  . Lumbar laminectomy/decompression microdiscectomy  07/14/2012    Procedure: LUMBAR LAMINECTOMY/DECOMPRESSION MICRODISCECTOMY 2 LEVELS;  Surgeon: Eustace Moore, MD;  Location: [place] NEURO ORS;  Service: Neurosurgery;  Laterality: Left;  Lumbar two three laminectomy, left thoracic four five transpedicular diskectomy    Inda Coke MS, RD, LDN Inpatient Registered Dietitian Pager: [phone] After-hours pager: [phone]

## 2014-07-11 NOTE — Progress Notes (Signed)
OG tube advanced approx. 7cm.

## 2014-07-12 ENCOUNTER — Inpatient Hospital Stay (HOSPITAL_COMMUNITY): Payer: Medicare Other

## 2014-07-12 ENCOUNTER — Encounter (HOSPITAL_COMMUNITY): Payer: Self-pay | Admitting: Student

## 2014-07-12 DIAGNOSIS — K5904 Chronic idiopathic constipation: Secondary | ICD-10-CM | POA: Diagnosis present

## 2014-07-12 DIAGNOSIS — A419 Sepsis, unspecified organism: Secondary | ICD-10-CM

## 2014-07-12 LAB — GLUCOSE, CAPILLARY
GLUCOSE-CAPILLARY: 166 mg/dL — AB (ref 70–99)
GLUCOSE-CAPILLARY: 175 mg/dL — AB (ref 70–99)
GLUCOSE-CAPILLARY: 179 mg/dL — AB (ref 70–99)
GLUCOSE-CAPILLARY: 198 mg/dL — AB (ref 70–99)
Glucose-Capillary: 144 mg/dL — ABNORMAL HIGH (ref 70–99)
Glucose-Capillary: 112 mg/dL — ABNORMAL HIGH (ref 70–99)
Glucose-Capillary: 153 mg/dL — ABNORMAL HIGH (ref 70–99)
Glucose-Capillary: 180 mg/dL — ABNORMAL HIGH (ref 70–99)

## 2014-07-12 LAB — BLOOD GAS, ARTERIAL
Acid-base deficit: 2.7 mmol/L — ABNORMAL HIGH (ref 0.0–2.0)
Bicarbonate: 20.1 mEq/L (ref 20.0–24.0)
DRAWN BY: 24487
FIO2: 0.6 %
MECHVT: 640 mL
O2 SAT: 99.4 %
PCO2 ART: 29 mmHg — AB (ref 35.0–45.0)
PEEP/CPAP: 10 cmH2O
PH ART: 7.464 — AB (ref 7.350–7.450)
PO2 ART: 166 mmHg — AB (ref 80.0–100.0)
Patient temperature: 101.6
RATE: 24 resp/min
TCO2: 21 mmol/L (ref 0–100)

## 2014-07-12 LAB — BASIC METABOLIC PANEL
ANION GAP: 20 — AB (ref 5–15)
BUN: 51 mg/dL — ABNORMAL HIGH (ref 6–23)
CALCIUM: 8.2 mg/dL — AB (ref 8.4–10.5)
CO2: 20 mEq/L (ref 19–32)
Chloride: 98 mEq/L (ref 96–112)
Creatinine, Ser: 3.14 mg/dL — ABNORMAL HIGH (ref 0.50–1.35)
GFR calc non Af Amer: 19 mL/min — ABNORMAL LOW (ref >90)
GFR, EST AFRICAN AMERICAN: 22 mL/min — AB (ref >90)
Glucose, Bld: 146 mg/dL — ABNORMAL HIGH (ref 70–99)
Potassium: 3.5 mEq/L — ABNORMAL LOW (ref 3.7–5.3)
SODIUM: 138 meq/L (ref 137–147)

## 2014-07-12 LAB — CBC
HCT: 31.9 % — ABNORMAL LOW (ref 39.0–52.0)
Hemoglobin: 10.7 g/dL — ABNORMAL LOW (ref 13.0–17.0)
MCH: 30.4 pg (ref 26.0–34.0)
MCHC: 33.5 g/dL (ref 30.0–36.0)
MCV: 90.6 fL (ref 78.0–100.0)
PLATELETS: 121 10*3/uL — AB (ref 150–400)
RBC: 3.52 MIL/uL — ABNORMAL LOW (ref 4.22–5.81)
RDW: 15.3 % (ref 11.5–15.5)
WBC: 10.4 10*3/uL (ref 4.0–10.5)

## 2014-07-12 LAB — PROCALCITONIN: Procalcitonin: 11.04 ng/mL

## 2014-07-12 LAB — CK: CK TOTAL: 1156 U/L — AB (ref 7–232)

## 2014-07-12 LAB — MAGNESIUM: MAGNESIUM: 1.6 mg/dL (ref 1.5–2.5)

## 2014-07-12 LAB — PHOSPHORUS: Phosphorus: 1.9 mg/dL — ABNORMAL LOW (ref 2.3–4.6)

## 2014-07-12 MED ORDER — ATROPINE SULFATE 0.1 MG/ML IJ SOLN
INTRAMUSCULAR | Status: AC
  Filled 2014-07-12: qty 10

## 2014-07-12 MED ORDER — GABAPENTIN 300 MG PO CAPS
300.0000 mg | ORAL_CAPSULE | Freq: Two times a day (BID) | ORAL | Status: DC
  Administered 2014-07-12: 300 mg via ORAL
  Filled 2014-07-12 (×3): qty 1

## 2014-07-12 MED ORDER — ENOXAPARIN SODIUM 30 MG/0.3ML ~~LOC~~ SOLN
30.0000 mg | SUBCUTANEOUS | Status: DC
  Administered 2014-07-12: 30 mg via SUBCUTANEOUS
  Filled 2014-07-12 (×2): qty 0.3

## 2014-07-12 MED ORDER — VITAL HIGH PROTEIN PO LIQD
1000.0000 mL | ORAL | Status: DC
  Administered 2014-07-12 – 2014-07-13 (×2): 1000 mL
  Filled 2014-07-12 (×10): qty 1000

## 2014-07-12 MED ORDER — GABAPENTIN 600 MG PO TABS
300.0000 mg | ORAL_TABLET | Freq: Two times a day (BID) | ORAL | Status: DC
  Filled 2014-07-12: qty 0.5

## 2014-07-12 MED ORDER — METOCLOPRAMIDE HCL 5 MG/ML IJ SOLN
5.0000 mg | Freq: Three times a day (TID) | INTRAMUSCULAR | Status: DC
  Administered 2014-07-12 – 2014-07-14 (×8): 5 mg via INTRAVENOUS
  Filled 2014-07-12 (×12): qty 1

## 2014-07-12 NOTE — Progress Notes (Signed)
  Echocardiogram 2D Echocardiogram has been performed.  Travis [name] 07/12/2014, 10:45 AM

## 2014-07-12 NOTE — Progress Notes (Signed)
NUTRITION FOLLOW-UP  DOCUMENTATION CODES Per approved criteria  -Obesity Unspecified   INTERVENTION: Initiate Vital High Protein at 25 ml/hr, and advance by 10 ml q 8 hours, to goal of 75 ml/hr. Goal regimen will provide: 1800 kcal (63% of estimated kcal needs), 158 grams protein (100% estimated protein needs), and 1505 ml free water. RD to continue to follow nutrition care plan.  NUTRITION DIAGNOSIS: Inadequate oral intake related to inability to eat as evidenced by NPO status. Ongoing.  Goal: Enteral nutrition to provide 60-70% of estimated calorie needs (22-25 kcals/kg ideal body weight) and 100% of estimated protein needs, based on ASPEN guidelines for permissive underfeeding in critically ill obese individuals. Unmet.  Monitor:  weight trends, lab trends, I/O's, vent status/settings, tolerance of enteral nutrition  ASSESSMENT: PMHx significant for HTN, hypothyroidism, OSA on CPAP, DM2. Admitted with fevers, chills and SOB x 1 day. Work-up reveals sepsis 2/2  bronchopneumonia.  Intubated 7/13 secondary to progressive respiratory failure.  Patient is currently intubated on ventilator support MV: 15.7 L/min Temp (24hrs), Avg:101.6 F (38.7 C), Min:99.7 F (37.6 C), Max:102.8 F (39.3 C)  Propofol: none  RD consulted to initiate nutrition support yesterday. Reglan has been added and RD consulted to begin nutrition again today. Currently with orders for Vital High Protein at 75 ml/hr. This is currently not running. Discussed with RN who notes that pt has a very distended abdomen.  Potassium low at 3.5 Phosphorus low at 1.9 Magnesium WNL Trig WNL  Height: Ht Readings from Last 1 Encounters:  07/12/14 6\' 1"  (1.854 m)    Weight: Wt Readings from Last 1 Encounters:  07/12/14 290 lb 12.6 oz (131.9 kg)  Admit wt 285 lb  BMI:  Body mass index is 38.37 kg/(m^2). Obese Class II  Estimated Nutritional Needs: Kcal: 2781 Protein: 155 - 175 g Fluid: per MD  Skin:  intact  Diet Order: NPO   Intake/Output Summary (Last 24 hours) at 07/12/14 1200 Last data filed at 07/12/14 0900  Gross per 24 hour  Intake 1672.2 ml  Output    835 ml  Net  837.2 ml    Last BM: 7/13  Labs:   Recent Labs Lab 07/10/14 1255 07/11/14 0255 07/12/14 0615  NA 142 142 138  K 4.1 4.3 3.5*  CL 103 103 98  CO2 24 21 20   BUN 31* 33* 51*  CREATININE 1.12 1.80* 3.14*  CALCIUM 9.3 8.2* 8.2*  MG  --   --  1.6  PHOS  --   --  1.9*  GLUCOSE 139* 188* 146*    CBG (last 3)   Recent Labs  07/11/14 2356 07/12/14 0337 07/12/14 0754  GLUCAP 175* 144* 112*    Scheduled Meds: . antiseptic oral rinse  15 mL Mouth Rinse QID  . azithromycin  500 mg Intravenous Q24H  . cefTRIAXone (ROCEPHIN)  IV  1 g Intravenous Q24H  . chlorhexidine  15 mL Mouth Rinse BID  . doxazosin  4 mg Per Tube QHS  . enoxaparin (LOVENOX) injection  30 mg Subcutaneous Q24H  . feeding supplement (VITAL HIGH PROTEIN)  1,000 mL Per Tube Q24H  . gabapentin  300 mg Oral BID  . insulin aspart  0-20 Units Subcutaneous 6 times per day  . levothyroxine  150 mcg Per Tube QAC breakfast  . metoCLOPramide (REGLAN) injection  5 mg Intravenous 3 times per day  . metoprolol tartrate  25 mg Oral BID  . pantoprazole sodium  40 mg Per Tube Q24H  . pramipexole  0.25 mg Per Tube BID  . sodium chloride  3 mL Intravenous Q12H    Continuous Infusions: . sodium chloride 30 mL/hr at 07/12/14 0900  . fentaNYL infusion INTRAVENOUS 25 mcg/hr (07/12/14 1014)  . norepinephrine (LEVOPHED) Adult infusion 10 mcg/min (07/12/14 0900)  . propofol Stopped (07/10/14 2230)     Inda Coke MS, RD, LDN Inpatient Registered Dietitian Pager: [phone] After-hours pager: [phone]

## 2014-07-12 NOTE — Consult Note (Signed)
I saw the patient and agree with the above assessment and plan.    61M w/ normal GFR admitted with PNA, hypoxic RF, and septic shock.  Has developed oliguric AKI.  Urine sediment with muddy brown casts.  He has ATN from hypotension and sepsis.  For now would continue supportive care, supporting BP, keeping euvolemic, avoiding nephrotoxins.  We will follow daily but no dialysis needs at this time.

## 2014-07-12 NOTE — Progress Notes (Signed)
PULMONARY / CRITICAL CARE MEDICINE   Name: Travis [name] MRN: [id number] DOB: 1946/07/23    ADMISSION DATE:  07/10/2014 CONSULTATION DATE:  07/10/2014  REFERRING MD :  Sloan Leiter  CHIEF COMPLAINT:  Short of breath  BRIEF PATIENT DESCRIPTION:  68 yo male former smoker presented with fever (Tm 101F), chills, dyspnea, muscle aches x one day.  CXR showed pneumonia, and u/a suggested UTI.  He developed progressive respiratory failure requiring intubation.  PCCM assumed care in ICU.  SIGNIFICANT EVENTS: 7/13 Admit, failed BiPAP, intubated  STUDIES:  CXR 7/14 >>mild bibasilar atelectasis, no pneumothorax or pleural effusion CXR 7/15>>low lung volumes, developing infiltrates difficult to exclude, slightly elevated pulmonary vascular congestion without edema   LINES / TUBES: ETT 7/13 >>   CULTURES: Blood 7/13 >> Sputum 7/13 >>  Urine 7/13 >> 7/15 Multiple bacterial morphotypes, none predominant, 25k colonies/ml  ANTIBIOTICS: Rocephin 7/13 >> Zithromax 7/13 >>    SUBJECTIVE:   VITAL SIGNS: Temp:  [99.7 F (37.6 C)-102.8 F (39.3 C)] 101.7 F (38.7 C) (07/15 0700) Pulse Rate:  [66-91] 82 (07/15 0840) Resp:  [14-27] 27 (07/15 0840) BP: (75-194)/(36-57) 95/40 mmHg (07/15 0840) SpO2:  [96 %-100 %] 98 % (07/15 0840) Arterial Line BP: (86-202)/(36-59) 130/48 mmHg (07/15 0700) FiO2 (%):  [50 %-60 %] 50 % (07/15 0840) Weight:  [290 lb 12.6 oz (131.9 kg)] 290 lb 12.6 oz (131.9 kg) (07/15 0334) HEMODYNAMICS: CVP:  [7 mmHg-10 mmHg] 9 mmHg VENTILATOR SETTINGS: Vent Mode:  [-] PRVC FiO2 (%):  [50 %-60 %] 50 % Set Rate:  [24 bmp] 24 bmp Vt Set:  [640 mL] 640 mL PEEP:  [10 cmH20] 10 cmH20 Plateau Pressure:  [20 cmH20-24 cmH20] 22 cmH20 INTAKE / OUTPUT: Intake/Output     07/14 0701 - 07/15 0700 07/15 0701 - 07/16 0700   I.V. (mL/kg) 1499.6 (11.4)    NG/GT 200    IV Piggyback 300    Total Intake(mL/kg) 1999.6 (15.2)    Urine (mL/kg/hr) 290 (0.1)    Emesis/NG output 600 (0.2)     Total Output 890     Net +1109.6            PHYSICAL EXAMINATION: General: ill appearing feverish obese male Neuro:  Alert, follows commands, moves all extremities HEENT:  Pupils reactive, no sinus tenderness, no oral exudate, no LAN Cardiovascular:  Regular, tachycardic, no murmur Lungs:  Decreased BS rt base, scattered rhonchi, no wheeze Abdomen:  Distended, non tender,hypoactive bowel sounds Musculoskeletal:  1+ edema in LE bilaterally to above knee Skin:  No rashes  LABS:  CBC  Recent Labs Lab 07/10/14 1255 07/11/14 0255 07/12/14 0615  WBC 7.4 11.1* 10.4  HGB 13.2 11.3* 10.7*  HCT 39.0 34.9* 31.9*  PLT 149* 132* 121*   Coag's No results found for this basename: APTT, INR,  in the last 168 hours  BMET  Recent Labs Lab 07/10/14 1255 07/11/14 0255 07/12/14 0615  NA 142 142 138  K 4.1 4.3 3.5*  CL 103 103 98  CO2 24 21 20   BUN 31* 33* 51*  CREATININE 1.12 1.80* 3.14*  GLUCOSE 139* 188* 146*   Electrolytes  Recent Labs Lab 07/10/14 1255 07/11/14 0255 07/12/14 0615  CALCIUM 9.3 8.2* 8.2*  MG  --   --  1.6  PHOS  --   --  1.9*   Sepsis Markers  Recent Labs Lab 07/10/14 1314 07/10/14 2105 07/12/14 0615  LATICACIDVEN 2.13  --   --   PROCALCITON  --  2.90 11.04   ABG  Recent Labs Lab 07/11/14 1257 07/11/14 2051 07/12/14 0500  PHART 7.410 7.364 7.464*  PCO2ART 35.3 36.6 29.0*  PO2ART 120.0* 89.0 166.0*    Liver Enzymes  Recent Labs Lab 07/10/14 1255  AST 24  ALT 29  ALKPHOS 70  BILITOT 0.7  ALBUMIN 4.3   Cardiac Enzymes  Recent Labs Lab 07/10/14 1255 07/10/14 2105  TROPONINI  --  0.33*  PROBNP 142.2* 1750.0*   Glucose  Recent Labs Lab 07/11/14 1249 07/11/14 1638 07/11/14 1950 07/11/14 2356 07/12/14 0337 07/12/14 0754  GLUCAP 145* 184* 179* 175* 144* 112*    Imaging Dg Chest Port 1 View  07/12/2014   CLINICAL DATA:  Short of breath, intubated  EXAM: PORTABLE CHEST - 1 VIEW  COMPARISON:  Prior chest x-ray  07/11/2014  FINDINGS: The patient remains intubated. The tip of the ET tube is 4.2 cm above the carina. Left IJ central venous catheter also unchanged with the tip at the superior cavoatrial junction. Inspiratory volumes remain extremely low. Slightly increased bibasilar atelectasis since the prior study. Persistent pulmonary vascular congestion without overt edema. No pneumothorax. Posterior spinal stabilization hardware again noted. Incompletely imaged nasogastric tube. The tip lies below the diaphragm likely within the stomach.  IMPRESSION: 1. Persistent extremely low inspiratory volumes with increased bibasilar atelectasis. Developing infiltrates are difficult to exclude radiographically. 2. Slightly increased pulmonary vascular congestion without edema. 3. Stable and satisfactory support apparatus.   Electronically Signed   By: Jacqulynn Cadet M.D.   On: 07/12/2014 07:41   Dg Chest Port 1 View  07/11/2014   CLINICAL DATA:  Check endotracheal tube placement  EXAM: PORTABLE CHEST - 1 VIEW  COMPARISON:  07/11/2014  FINDINGS: Endotracheal tube is stable at 3.3 cm above the carina. A left central venous line is again seen in the distal superior vena cava. No pneumothorax is noted. A nasogastric catheter is seen although its distal aspect is not visualized on this film. Postsurgical changes are noted. The overall inspiratory effort is poor with right basilar atelectasis.  IMPRESSION: No significant interval change from the prior exam.   Electronically Signed   By: Inez Catalina M.D.   On: 07/11/2014 21:03   Dg Chest Port 1 View  07/11/2014   CLINICAL DATA:  TLC placement  EXAM: PORTABLE CHEST - 1 VIEW  COMPARISON:  Chest radiograph July 11, 2014 at 5:09 a.m.  FINDINGS: Interval placement of internal jugular central venous catheter with distal tip projecting at cavoatrial junction. Endotracheal tube tip projects 3.3 cm above the carina. Nasogastric tube tip not imaged but at least past the midesophagus.  Persistently elevated right hemidiaphragm. Crowded vascular markings in this low inspiratory examination with minimal bibasilar atelectasis. No pleural effusions or focal consolidations. No pneumothorax.  Status post cervical posterior instrumentation and laminectomies. Status post mid to lower thoracic posterior spinal hardware.  IMPRESSION: Interval placement of left internal jugular central venous catheter with distal tip projecting cavoatrial junction. No pneumothorax. No apparent change remaining life support lines.  Low inspiratory examination with bibasilar atelectasis.   Electronically Signed   By: Elon Alas   On: 07/11/2014 12:54   Dg Chest Port 1 View  07/11/2014   CLINICAL DATA:  History of pneumonia.  Intubated patient.  EXAM: PORTABLE CHEST - 1 VIEW  COMPARISON:  Single view of the chest 07/10/2014.  FINDINGS: Endotracheal tube remains in place. Lung volumes are low with mild basilar atelectasis. No pneumothorax or pleural effusion. Heart size is upper normal.  IMPRESSION: ET tube in good position.  Subsegmental basilar atelectasis in a low volume chest.   Electronically Signed   By: Inge Rise M.D.   On: 07/11/2014 08:03   Dg Chest Port 1 View  07/10/2014   CLINICAL DATA:  Hypoxia  EXAM: PORTABLE CHEST - 1 VIEW  COMPARISON:  Study obtained earlier in the day  FINDINGS: Endotracheal tube tip is 4.6 cm above carina. No pneumothorax. The degree of inspiration is somewhat shallow on this study. There is no edema or consolidation. Heart is upper normal in size with normal pulmonary vascularity. No adenopathy. There is postoperative change in the cervical and lower thoracic regions.  IMPRESSION: No edema or consolidation. Endotracheal tube as described without apparent pneumothorax.   Electronically Signed   By: Lowella Grip M.D.   On: 07/10/2014 21:04   Dg Chest Port 1 View  07/10/2014   CLINICAL DATA:  Shortness of breath.  Respiratory distress.  EXAM: PORTABLE CHEST - 1 VIEW   COMPARISON:  07/10/2014  FINDINGS: Mildly elevated right hemidiaphragm. Low lung volumes are present, causing crowding of the pulmonary vasculature. Atherosclerotic aortic arch.  Lower thoracic posterolateral rod and pedicle screw fixation.  Improved left basilar airspace opacity, although not entirely resolved.  IMPRESSION: 1. Improved left basilar airspace opacity, although not resolved. This may reflect some improvement in atelectasis. Underlying bronchopneumonia remains a diagnostic possibility.   Electronically Signed   By: Sherryl Barters M.D.   On: 07/10/2014 19:36   Dg Chest Port 1 View  07/10/2014   CLINICAL DATA:  Fever  EXAM: PORTABLE CHEST - 1 VIEW  COMPARISON:  07/14/2012  FINDINGS: Low lung volumes are present, causing crowding of the pulmonary vasculature. Lower thoracic posterolateral rod and pedicle screw fixation noted.  Increased airspace opacity, indistinctly marginated, left lung base.  Atherosclerotic aortic arch.  IMPRESSION: 1. Indistinct airspace opacity at the left lung base, potentially from developing bronchopneumonia or underlying atelectasis. I favor the former. 2. Low lung volumes are present, causing crowding of the pulmonary vasculature.   Electronically Signed   By: Sherryl Barters M.D.   On: 07/10/2014 12:54   Dg Abd Portable 1v  07/11/2014   CLINICAL DATA:  Orogastric tube placed  EXAM: PORTABLE ABDOMEN - 1 VIEW  COMPARISON:  07/10/2014  FINDINGS: An orogastric tube tip is at the level of the proximal stomach. There is less gaseous distention of the stomach compared to yesterday. The visualized bowel gas pattern is nonobstructive. Cholecystectomy clips noted.  IMPRESSION: Orogastric tube tip at the proximal stomach. Advancement by 7 cm would provide more stable positioning.   Electronically Signed   By: Jorje Guild M.D.   On: 07/11/2014 06:57   Dg Abd Portable 1v  07/10/2014   CLINICAL DATA:  OG placement  EXAM: PORTABLE ABDOMEN - 1 VIEW  COMPARISON:  Prior  radiograph from 07/14/2012  FINDINGS: Gaseous distention of the stomach noted. An enteric tube is in place with tip located in the gastric antrum and side hole and mean distal gastric body. The tip projects towards the pylorus.  No evidence of bowel obstruction.  Spinal fixation hardware partially visualized.  IMPRESSION: 1. Enteric tube in place within the stomach with tip in the gastric antrum and side hole in the distal gastric body. 2. Gaseous distention of the stomach.   Electronically Signed   By: Jeannine Boga M.D.   On: 07/10/2014 23:23    ASSESSMENT / PLAN:  PULMONARY A: Acute hypoxic respiratory failure 2nd to CAP. Hx  of OSA. P:   - Full vent support until more stable, decrease PEEP to 5 and FiO2 to 40%. - F/u CXR, ABG. - PRN BD's.  CARDIOVASCULAR A:  Sinus tachycardia 2nd to hypoxia. Hx of HTN. P:  - D/C lopressor. - Follow CVP. - Continue levophed. - F/u Echo.  RENAL A:   Hypokalemia Hx of BPH.  Cr deteriorating with renal function P:   - Monitor renal fx, urine outpt, electrolytes. - D/C cardura.  GASTROINTESTINAL A:   Nutrition Constipation P:   - Tube feeds while on vent. - Protonix for SUP. - Scheduled reglan. -   HEMATOLOGIC A:   No acute issues. P:  - F/u CBC. - Lovenox for DVT prevention.  INFECTIOUS A:   CAP. UTI- 7/15 Multiple bacterial morphotypes, none predominant, 25k colonies/ml P:   - Day 3 rocephin, zithromax.  ENDOCRINE A:   Hx of DM type II. Hx of hypothyroidism. Hyperglycemia P:   - SSI. - Continue synthroid.  NEUROLOGIC A:   Sedation. Hx of RLS, peripheral neuropathy, cervical myelopathy. P:   - RASS goal: -1 - PAD 3 with prn versed, fentanyl drip - Continue neurontin, mirapex.  Updated family at bedside.  CC time 35 minutes.  Rush Farmer, M.D. [facility] Pulmonary/Critical Care Medicine. Pager: [phone]. After hours pager: [phone].  07/12/2014, 8:50 AM

## 2014-07-12 NOTE — Consult Note (Signed)
Reason for Consult: ARF  Referring Physician: Jennet Maduro, MD.  Travis [name] is an 68 y.o. male. With PMH of HTN, DM, OSA on CPAP. Pt is presently intubated so much of the hx ascertained form chart review and family- daughter and wife. Pt was admitted on the 07/10/2014 with C/o fever, chills and SOB. Pt is being managed for Sepsis - PNA (CAP) and UTI. Pt was given 2L of IVF bolus and then given maintenance fluids. Pt developed respiratory distress on the day of admission, was given 40mg  of IV lasix and was subsequently transferred to the ICU and intubated. Cr on the day of admission- 1.12, increased to 1.8- on the 14th and is 3.14 today. One episode of vomiting prior to admission, no diarrhea, and pt had normal PO intake. Prior to admission, family is not aware of how much ibuprofen or diclofenac (listed on pts med list) pt was taking.  Lisinopril- HCTZ, one of pts home meds were not started on admission, also pt has hd problems with urinary incontinence for years, no dysuria or straining. No contrast exposure through out admission. No seizures. Pts blood pressure dropped to 75/38 on the 13th. Total urine output- dropped from  1550 to 290 over 24hrs. Pt is presently on ceftriaxone and azithromycin but is still spiking fevers.  Trend in Creatinine: Creatinine, Ser  Date/Time Value Ref Range Status  07/12/2014  6:15 AM 3.14* 0.50 - 1.35 mg/dL Final     DELTA CHECK NOTED  07/11/2014  2:55 AM 1.80* 0.50 - 1.35 mg/dL Final     DELTA CHECK NOTED  07/10/2014 12:55 PM 1.12  0.50 - 1.35 mg/dL Final  07/13/2012 12:27 PM 1.08  0.50 - 1.35 mg/dL Final  02/03/2012 12:08 PM 1.03  0.50 - 1.35 mg/dL Final  05/08/2010  8:25 AM 1.11  0.4 - 1.5 mg/dL Final    PMH:   Past Medical History  Diagnosis Date  . PONV (postoperative nausea and vomiting)   . Hypertension   . DJD (degenerative joint disease)     WHOLE SPINE  . Hyperthyroidism     Had Iodine to resolve issue DX 10 YR AGO  . Hypothyroidism     Low d/t  treatment of hyperthyroidism  . Sleep apnea     DX  2009/2010 study done at [facility]  . Type II diabetes mellitus     diet controlled  . Heart murmur     Evaluated by cardiologist 4-5 years ago but does not currently see a cardiologist  . Gait disorder   . Cervical myelopathy     with myelomalacia at C5-6  . RLS (restless legs syndrome)   . Peripheral neuropathy   . 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 Constipation - functional     controlled with miralax    PSH:   Past Surgical History  Procedure Laterality Date  . Cholecystectomy    . Back surgery      6 surgeries  . Lumbar laminectomy/decompression microdiscectomy  07/14/2012    Procedure: LUMBAR LAMINECTOMY/DECOMPRESSION MICRODISCECTOMY 2 LEVELS;  Surgeon: Eustace Moore, MD;  Location: [place] [facility];  Service: Neurosurgery;  Laterality: Left;  Lumbar two three laminectomy, left thoracic four five transpedicular diskectomy    Allergies:  Allergies  Allergen Reactions  . Other Other (See Comments)    Anesthesia makes sick, must have antinausea medication in advance.    Medications:   Prior to Admission medications   Medication Sig Start Date End Date Taking? Authorizing Provider  ciclopirox (  PENLAC) 8 % solution Apply 1 application topically at bedtime as needed (dry legs).  07/05/14  Yes Historical Provider, MD  diclofenac (VOLTAREN) 75 MG EC tablet Take 75 mg by mouth daily after lunch. About 4pm   Yes Historical Provider, MD  diphenhydrAMINE (BENADRYL) 25 MG tablet Take 25 mg by mouth at bedtime as needed for sleep.   Yes Historical Provider, MD  doxazosin (CARDURA) 4 MG tablet Take 4 mg by mouth at bedtime.   Yes Historical Provider, MD  gabapentin (NEURONTIN) 600 MG tablet Take 600 mg by mouth 3 (three) times daily.   Yes Historical Provider, MD  ibuprofen (ADVIL,MOTRIN) 200 MG tablet Take 400 mg by mouth at bedtime as needed for moderate pain.   Yes Historical Provider, MD  levothyroxine (SYNTHROID, LEVOTHROID) 150 MCG  tablet Take 150 mcg by mouth daily.   Yes Historical Provider, MD  lisinopril-hydrochlorothiazide (PRINZIDE,ZESTORETIC) 20-12.5 MG per tablet Take 1 tablet by mouth every evening.    Yes Historical Provider, MD  metoprolol succinate (TOPROL-XL) 25 MG 24 hr tablet Take 25 mg by mouth daily.    Yes Historical Provider, MD  oxybutynin (DITROPAN) 5 MG tablet Take 5 mg by mouth daily as needed for bladder spasms.   Yes Historical Provider, MD  pramipexole (MIRAPEX) 0.125 MG tablet Take 0.25 mg by mouth 2 (two) times daily.    Yes Historical Provider, MD    Inpatient medications: . antiseptic oral rinse  15 mL Mouth Rinse QID  . azithromycin  500 mg Intravenous Q24H  . cefTRIAXone (ROCEPHIN)  IV  1 g Intravenous Q24H  . chlorhexidine  15 mL Mouth Rinse BID  . doxazosin  4 mg Per Tube QHS  . enoxaparin (LOVENOX) injection  30 mg Subcutaneous Q24H  . feeding supplement (VITAL HIGH PROTEIN)  1,000 mL Per Tube Q24H  . gabapentin  300 mg Oral BID  . insulin aspart  0-20 Units Subcutaneous 6 times per day  . levothyroxine  150 mcg Per Tube QAC breakfast  . metoCLOPramide (REGLAN) injection  5 mg Intravenous 3 times per day  . metoprolol tartrate  25 mg Oral BID  . pantoprazole sodium  40 mg Per Tube Q24H  . pramipexole  0.25 mg Per Tube BID  . sodium chloride  3 mL Intravenous Q12H    Discontinued Meds:   Medications Discontinued During This Encounter  Medication Reason  . gabapentin (NEURONTIN) 600 MG tablet Entry Error  . oxybutynin (DITROPAN) 5 MG tablet Dose change  . nabumetone (RELAFEN) 500 MG tablet Completed Course  . tiZANidine (ZANAFLEX) 4 MG tablet Discontinued by provider  . cefTRIAXone (ROCEPHIN) 1 g in dextrose 5 % 50 mL IVPB   . 0.9 %  sodium chloride infusion   . albuterol (PROVENTIL) (2.5 MG/3ML) 0.083% nebulizer solution 5 mg   . 0.9 %  sodium chloride infusion   . 0.9 %  sodium chloride infusion   . metoprolol succinate (TOPROL-XL) 24 hr tablet 25 mg   .  guaiFENesin-dextromethorphan (ROBITUSSIN DM) 100-10 MG/5ML syrup 5 mL   . oxyCODONE (Oxy IR/ROXICODONE) immediate release tablet 5 mg   . zolpidem (AMBIEN) tablet 5 mg   . docusate sodium (COLACE) capsule 100 mg   . alum & mag hydroxide-simeth (MAALOX/MYLANTA) 200-200-20 MG/5ML suspension 30 mL   . ondansetron (ZOFRAN) tablet 4 mg   . ondansetron (ZOFRAN) injection 4 mg   . acetaminophen (TYLENOL) tablet 650 mg   . acetaminophen (TYLENOL) suppository 650 mg   . doxazosin (CARDURA) tablet  4 mg   . levothyroxine (SYNTHROID, LEVOTHROID) tablet 150 mcg   . pramipexole (MIRAPEX) tablet 0.25 mg   . gabapentin (NEURONTIN) tablet 600 mg   . insulin aspart (novoLOG) injection 0-9 Units   . fentaNYL (SUBLIMAZE) 0.05 MG/ML injection Returned to ADS  . midazolam (VERSED) injection 1 mg   . feeding supplement (VITAL HIGH PROTEIN) liquid 1,000 mL   . norepinephrine (LEVOPHED) 4 mg in dextrose 5 % 250 mL infusion   . atropine 0.1 MG/ML injection Returned to ADS  . enoxaparin (LOVENOX) injection 40 mg   . gabapentin (NEURONTIN) tablet 600 mg   . gabapentin (NEURONTIN) tablet 300 mg Inpatient Standard   Family History:   Family History  Problem Relation Age of Onset  . Polycythemia Mother   . Diabetes Father   . Hypertension Father   . Heart disease Father     Review of system. CONSTITUTIONAL- No weightloss, night sweat or change in appetite. SKIN- No Rash, colour changes or itching. HEAD- No Headache or dizziness. EYES- No Vision loss, pain, redness, double or blurred vision. RESPIRATORY- No Cough or SOB. CARDIAC- No Palpitations, DOE, PND or chest pain. GI- No nausea, had one episode of vomiting prior to admission, no diarrhoea, constipation, or d pain. URINARY- Has frequency, no straining or dysuria. NEUROLOGIC- No Numbness, syncope, seizures or burning.  Weight change: 5 lb 12.6 oz (2.625 kg)  Intake/Output Summary (Last 24 hours) at 07/12/14 1200 Last data filed at 07/12/14 0900   Gross per 24 hour  Intake 1672.2 ml  Output    835 ml  Net  837.2 ml   BP 143/65  Pulse 69  Temp(Src) 101.1 F (38.4 C) (Core (Comment))  Resp 24  Ht 6\' 1"  (1.854 m)  Wt 290 lb 12.6 oz (131.9 kg)  BMI 38.37 kg/m2  SpO2 97% Filed Vitals:   07/12/14 0800 07/12/14 0840 07/12/14 0900 07/12/14 1145  BP: 111/43 95/40  143/65  Pulse:  82  69  Temp: 101.1 F (38.4 C)     TempSrc: Core (Comment)  Core (Comment)   Resp: 24 27  24   Height:      Weight:      SpO2: 98% 98%  97%     Physical exam-   GENERAL- Awake but a bit drowsy, intubated, not in any distress. HEENT- Atraumatic, normocephalic, pupils constricted  CARDIAC- RRR, no murmurs, rubs or gallops. RESP- Moving equal volumes of air, and clear to auscultation bilaterally, no wheezes or crackles. ABDOMEN- Soft, appears distended, but family says pt belly is usually distended, no fluid thrill appreciated, nontender, no guarding or rebound, no palpable masses or organomegaly, bowel sounds faint. NEURO- Drowsy on sedative, but co-operative, no asterexis.  EXTREMITIES- pulse 1+ up to upper 3rd of legs, symmetric. SKIN- Warm, dry, No rash or lesion.  Labs: Basic Metabolic Panel:  Recent Labs Lab 07/10/14 1255 07/11/14 0255 07/12/14 0615  NA 142 142 138  K 4.1 4.3 3.5*  CL 103 103 98  CO2 24 21 20   GLUCOSE 139* 188* 146*  BUN 31* 33* 51*  CREATININE 1.12 1.80* 3.14*  ALBUMIN 4.3  --   --   CALCIUM 9.3 8.2* 8.2*  PHOS  --   --  1.9*   Liver Function Tests:  Recent Labs Lab 07/10/14 1255  AST 24  ALT 29  ALKPHOS 70  BILITOT 0.7  PROT 7.4  ALBUMIN 4.3   CBC:  Recent Labs Lab 07/10/14 1255 07/11/14 0255 07/12/14 0615  WBC 7.4  11.1* 10.4  NEUTROABS 6.8  --   --   HGB 13.2 11.3* 10.7*  HCT 39.0 34.9* 31.9*  MCV 89.4 91.8 90.6  PLT 149* 132* 121*   Cardiac Enzymes: ) Recent Labs Lab 07/10/14 2105  TROPONINI 0.33*   CBG:  Recent Labs Lab 07/11/14 1638 07/11/14 1950 07/11/14 2356  07/12/14 0337 07/12/14 0754  GLUCAP 184* 179* 175* 144* 112*    Xrays/Other Studies: Dg Chest Port 1 View  07/12/2014   CLINICAL DATA:  Short of breath, intubated  EXAM: PORTABLE CHEST - 1 VIEW  COMPARISON:  Prior chest x-ray 07/11/2014  FINDINGS: The patient remains intubated. The tip of the ET tube is 4.2 cm above the carina. Left IJ central venous catheter also unchanged with the tip at the superior cavoatrial junction. Inspiratory volumes remain extremely low. Slightly increased bibasilar atelectasis since the prior study. Persistent pulmonary vascular congestion without overt edema. No pneumothorax. Posterior spinal stabilization hardware again noted. Incompletely imaged nasogastric tube. The tip lies below the diaphragm likely within the stomach.  IMPRESSION: 1. Persistent extremely low inspiratory volumes with increased bibasilar atelectasis. Developing infiltrates are difficult to exclude radiographically. 2. Slightly increased pulmonary vascular congestion without edema. 3. Stable and satisfactory support apparatus.   Electronically Signed   By: Jacqulynn Cadet M.D.   On: 07/12/2014 07:41   Dg Chest Port 1 View  07/11/2014   CLINICAL DATA:  Check endotracheal tube placement  EXAM: PORTABLE CHEST - 1 VIEW  COMPARISON:  07/11/2014  FINDINGS: Endotracheal tube is stable at 3.3 cm above the carina. A left central venous line is again seen in the distal superior vena cava. No pneumothorax is noted. A nasogastric catheter is seen although its distal aspect is not visualized on this film. Postsurgical changes are noted. The overall inspiratory effort is poor with right basilar atelectasis.  IMPRESSION: No significant interval change from the prior exam.   Electronically Signed   By: Inez Catalina M.D.   On: 07/11/2014 21:03   Dg Chest Port 1 View  07/11/2014   CLINICAL DATA:  TLC placement  EXAM: PORTABLE CHEST - 1 VIEW  COMPARISON:  Chest radiograph July 11, 2014 at 5:09 a.m.  FINDINGS: Interval  placement of internal jugular central venous catheter with distal tip projecting at cavoatrial junction. Endotracheal tube tip projects 3.3 cm above the carina. Nasogastric tube tip not imaged but at least past the midesophagus. Persistently elevated right hemidiaphragm. Crowded vascular markings in this low inspiratory examination with minimal bibasilar atelectasis. No pleural effusions or focal consolidations. No pneumothorax.  Status post cervical posterior instrumentation and laminectomies. Status post mid to lower thoracic posterior spinal hardware.  IMPRESSION: Interval placement of left internal jugular central venous catheter with distal tip projecting cavoatrial junction. No pneumothorax. No apparent change remaining life support lines.  Low inspiratory examination with bibasilar atelectasis.   Electronically Signed   By: Elon Alas   On: 07/11/2014 12:54   Dg Chest Port 1 View  07/11/2014   CLINICAL DATA:  History of pneumonia.  Intubated patient.  EXAM: PORTABLE CHEST - 1 VIEW  COMPARISON:  Single view of the chest 07/10/2014.  FINDINGS: Endotracheal tube remains in place. Lung volumes are low with mild basilar atelectasis. No pneumothorax or pleural effusion. Heart size is upper normal.  IMPRESSION: ET tube in good position.  Subsegmental basilar atelectasis in a low volume chest.   Electronically Signed   By: Inge Rise M.D.   On: 07/11/2014 08:03   Dg Chest  Port 1 View  07/10/2014   CLINICAL DATA:  Hypoxia  EXAM: PORTABLE CHEST - 1 VIEW  COMPARISON:  Study obtained earlier in the day  FINDINGS: Endotracheal tube tip is 4.6 cm above carina. No pneumothorax. The degree of inspiration is somewhat shallow on this study. There is no edema or consolidation. Heart is upper normal in size with normal pulmonary vascularity. No adenopathy. There is postoperative change in the cervical and lower thoracic regions.  IMPRESSION: No edema or consolidation. Endotracheal tube as described without  apparent pneumothorax.   Electronically Signed   By: Lowella Grip M.D.   On: 07/10/2014 21:04   Dg Chest Port 1 View  07/10/2014   CLINICAL DATA:  Shortness of breath.  Respiratory distress.  EXAM: PORTABLE CHEST - 1 VIEW  COMPARISON:  07/10/2014  FINDINGS: Mildly elevated right hemidiaphragm. Low lung volumes are present, causing crowding of the pulmonary vasculature. Atherosclerotic aortic arch.  Lower thoracic posterolateral rod and pedicle screw fixation.  Improved left basilar airspace opacity, although not entirely resolved.  IMPRESSION: 1. Improved left basilar airspace opacity, although not resolved. This may reflect some improvement in atelectasis. Underlying bronchopneumonia remains a diagnostic possibility.   Electronically Signed   By: Sherryl Barters M.D.   On: 07/10/2014 19:36   Dg Chest Port 1 View  07/10/2014   CLINICAL DATA:  Fever  EXAM: PORTABLE CHEST - 1 VIEW  COMPARISON:  07/14/2012  FINDINGS: Low lung volumes are present, causing crowding of the pulmonary vasculature. Lower thoracic posterolateral rod and pedicle screw fixation noted.  Increased airspace opacity, indistinctly marginated, left lung base.  Atherosclerotic aortic arch.  IMPRESSION: 1. Indistinct airspace opacity at the left lung base, potentially from developing bronchopneumonia or underlying atelectasis. I favor the former. 2. Low lung volumes are present, causing crowding of the pulmonary vasculature.   Electronically Signed   By: Sherryl Barters M.D.   On: 07/10/2014 12:54   Dg Abd Portable 1v  07/11/2014   CLINICAL DATA:  Orogastric tube placed  EXAM: PORTABLE ABDOMEN - 1 VIEW  COMPARISON:  07/10/2014  FINDINGS: An orogastric tube tip is at the level of the proximal stomach. There is less gaseous distention of the stomach compared to yesterday. The visualized bowel gas pattern is nonobstructive. Cholecystectomy clips noted.  IMPRESSION: Orogastric tube tip at the proximal stomach. Advancement by 7 cm would  provide more stable positioning.   Electronically Signed   By: Jorje Guild M.D.   On: 07/11/2014 06:57   Dg Abd Portable 1v  07/10/2014   CLINICAL DATA:  OG placement  EXAM: PORTABLE ABDOMEN - 1 VIEW  COMPARISON:  Prior radiograph from 07/14/2012  FINDINGS: Gaseous distention of the stomach noted. An enteric tube is in place with tip located in the gastric antrum and side hole and mean distal gastric body. The tip projects towards the pylorus.  No evidence of bowel obstruction.  Spinal fixation hardware partially visualized.  IMPRESSION: 1. Enteric tube in place within the stomach with tip in the gastric antrum and side hole in the distal gastric body. 2. Gaseous distention of the stomach.   Electronically Signed   By: Jeannine Boga M.D.   On: 07/10/2014 23:23    Assessment/Plan:   Acute kidney injury- Cr today- 3.14 acute increase from 1.12 on the13th, 2 days ago. Most likely due to ATN - from sepsis and possibly contribution by ischemia from poor perfusion, as pt blood supply was compromised by episode of hypotension on the 13th- Bp- 70/35,  with subsequent increased Cr on the 14th. BUN/Cr ratio- 16.24 suggesting intrinsic renal pathology- ATN. UA on admission, with presence of small blood, no protein, with leukocytes and bacteria. Repeat UA today showed casts- Granular and brown suggestive of ATN. For now will watch and wait, in the hopes of recovery of kidney function. No fluids as pt does not appear dehydrated, but may be slightly on fluid overloaded. CVP- 10.  No indication for dialysis at this time. - Daily weights - daily Bmets - Strict in and out - Urine Na - Urinalysis. - CPK - Renal US.  Acute Respiratory failure- Intubated on vent. Likely from PNA Vs pulmonary edema.  Diabetes- On SS-R  HTN- Home meds- Lisinopril-HCTZ held, metoprolol also not given. Presently on Levofed.   [employer], PGY-2 07/12/2014, 12:00 PM

## 2014-07-13 ENCOUNTER — Inpatient Hospital Stay (HOSPITAL_COMMUNITY): Payer: Medicare Other

## 2014-07-13 LAB — CBC
HCT: 32.1 % — ABNORMAL LOW (ref 39.0–52.0)
Hemoglobin: 10.5 g/dL — ABNORMAL LOW (ref 13.0–17.0)
MCH: 29.7 pg (ref 26.0–34.0)
MCHC: 32.7 g/dL (ref 30.0–36.0)
MCV: 90.7 fL (ref 78.0–100.0)
PLATELETS: 120 10*3/uL — AB (ref 150–400)
RBC: 3.54 MIL/uL — AB (ref 4.22–5.81)
RDW: 15.1 % (ref 11.5–15.5)
WBC: 8.8 10*3/uL (ref 4.0–10.5)

## 2014-07-13 LAB — MAGNESIUM: MAGNESIUM: 1.8 mg/dL (ref 1.5–2.5)

## 2014-07-13 LAB — BLOOD GAS, ARTERIAL
ACID-BASE DEFICIT: 0.5 mmol/L (ref 0.0–2.0)
BICARBONATE: 23.5 meq/L (ref 20.0–24.0)
Drawn by: 41977
FIO2: 40 %
MECHVT: 640 mL
O2 SAT: 92.6 %
PEEP: 5 cmH2O
Patient temperature: 98.6
RATE: 18 resp/min
TCO2: 24.7 mmol/L (ref 0–100)
pCO2 arterial: 38 mmHg (ref 35.0–45.0)
pH, Arterial: 7.409 (ref 7.350–7.450)
pO2, Arterial: 60.6 mmHg — ABNORMAL LOW (ref 80.0–100.0)

## 2014-07-13 LAB — BASIC METABOLIC PANEL
ANION GAP: 15 (ref 5–15)
BUN: 48 mg/dL — ABNORMAL HIGH (ref 6–23)
CHLORIDE: 103 meq/L (ref 96–112)
CO2: 23 meq/L (ref 19–32)
CREATININE: 1.86 mg/dL — AB (ref 0.50–1.35)
Calcium: 8.1 mg/dL — ABNORMAL LOW (ref 8.4–10.5)
GFR calc Af Amer: 41 mL/min — ABNORMAL LOW (ref >90)
GFR calc non Af Amer: 35 mL/min — ABNORMAL LOW (ref >90)
GLUCOSE: 151 mg/dL — AB (ref 70–99)
Potassium: 3.7 mEq/L (ref 3.7–5.3)
Sodium: 141 mEq/L (ref 137–147)

## 2014-07-13 LAB — SODIUM, URINE, RANDOM: Sodium, Ur: <20 mEq/L

## 2014-07-13 LAB — GLUCOSE, CAPILLARY
GLUCOSE-CAPILLARY: 156 mg/dL — AB (ref 70–99)
Glucose-Capillary: 148 mg/dL — ABNORMAL HIGH (ref 70–99)
Glucose-Capillary: 164 mg/dL — ABNORMAL HIGH (ref 70–99)
Glucose-Capillary: 210 mg/dL — ABNORMAL HIGH (ref 70–99)
Glucose-Capillary: 196 mg/dL — ABNORMAL HIGH (ref 70–99)

## 2014-07-13 LAB — CULTURE, RESPIRATORY

## 2014-07-13 LAB — PHOSPHORUS: Phosphorus: 2.5 mg/dL (ref 2.3–4.6)

## 2014-07-13 MED ORDER — DEXMEDETOMIDINE HCL IN NACL 200 MCG/50ML IV SOLN
0.4000 ug/kg/h | INTRAVENOUS | Status: DC
  Administered 2014-07-13 (×2): 0.6 ug/kg/h via INTRAVENOUS
  Administered 2014-07-13: 0.8 ug/kg/h via INTRAVENOUS
  Administered 2014-07-13: 0.6 ug/kg/h via INTRAVENOUS
  Administered 2014-07-13: 0.4 ug/kg/h via INTRAVENOUS
  Administered 2014-07-13 (×2): 0.6 ug/kg/h via INTRAVENOUS
  Filled 2014-07-13 (×7): qty 50

## 2014-07-13 MED ORDER — MAGNESIUM SULFATE 40 MG/ML IJ SOLN
2.0000 g | Freq: Once | INTRAMUSCULAR | Status: AC
  Administered 2014-07-13: 2 g via INTRAVENOUS
  Filled 2014-07-13: qty 50

## 2014-07-13 MED ORDER — GABAPENTIN 250 MG/5ML PO SOLN
600.0000 mg | Freq: Two times a day (BID) | ORAL | Status: DC
  Administered 2014-07-13 (×2): 600 mg
  Filled 2014-07-13 (×4): qty 12

## 2014-07-13 MED ORDER — DEXMEDETOMIDINE HCL IN NACL 400 MCG/100ML IV SOLN
0.4000 ug/kg/h | INTRAVENOUS | Status: DC
  Administered 2014-07-13 – 2014-07-14 (×3): 0.8 ug/kg/h via INTRAVENOUS
  Filled 2014-07-13 (×5): qty 100

## 2014-07-13 MED ORDER — ENOXAPARIN SODIUM 40 MG/0.4ML ~~LOC~~ SOLN
40.0000 mg | SUBCUTANEOUS | Status: DC
  Administered 2014-07-13 – 2014-07-17 (×5): 40 mg via SUBCUTANEOUS
  Filled 2014-07-13 (×6): qty 0.4

## 2014-07-13 MED ORDER — K PHOS MONO-SOD PHOS DI & MONO 155-852-130 MG PO TABS
500.0000 mg | ORAL_TABLET | Freq: Two times a day (BID) | ORAL | Status: AC
  Administered 2014-07-13 (×2): 500 mg via ORAL
  Filled 2014-07-13 (×2): qty 2

## 2014-07-13 MED ORDER — FLEET ENEMA 7-19 GM/118ML RE ENEM
1.0000 | ENEMA | Freq: Once | RECTAL | Status: AC
  Administered 2014-07-13: 1 via RECTAL
  Filled 2014-07-13: qty 1

## 2014-07-13 NOTE — Progress Notes (Signed)
PULMONARY / CRITICAL CARE MEDICINE   Name: Travis [name] MRN: [id number] DOB: 1946-04-11    ADMISSION DATE:  07/10/2014 CONSULTATION DATE:  07/10/2014  REFERRING MD :  Sloan Leiter  CHIEF COMPLAINT:  Short of breath  BRIEF PATIENT DESCRIPTION:  68 yo male former smoker presented with fever (Tm 101F), chills, dyspnea, muscle aches x one day.  CXR showed pneumonia, and u/a suggested UTI.  He developed progressive respiratory failure requiring intubation.  PCCM assumed care in ICU.  SIGNIFICANT EVENTS: 7/13 Admit, failed BiPAP, intubated  STUDIES:  CXR 7/14 >>mild bibasilar atelectasis, no pneumothorax or pleural effusion CXR 7/15>>low lung volumes, developing infiltrates difficult to exclude, slightly elevated pulmonary vascular congestion without edema  LINES / TUBES: ETT 7/13 >>  R IJ TLC 7/14>>> R radial a-line 7/14>>>7/15  CULTURES: Blood 7/13 >> Sputum 7/13 >>  Urine 7/13 >> 7/15 Multiple bacterial morphotypes, none predominant, 25k colonies/ml  ANTIBIOTICS: Rocephin 7/13 >> Zithromax 7/13 >>   SUBJECTIVE: No events overnight, off pressors for now.  VITAL SIGNS: Temp:  [98.6 F (37 C)-101.8 F (38.8 C)] 99.9 F (37.7 C) (07/16 0600) Pulse Rate:  [66-131] 66 (07/16 0331) Resp:  [13-30] 19 (07/16 0600) BP: (84-173)/(36-108) 128/42 mmHg (07/16 0600) SpO2:  [90 %-100 %] 99 % (07/16 0600) Arterial Line BP: (114-134)/(39-53) 122/53 mmHg (07/15 1000) FiO2 (%):  [40 %-50 %] 50 % (07/16 0436) Weight:  [286 lb 9.6 oz (130 kg)] 286 lb 9.6 oz (130 kg) (07/16 0500)  HEMODYNAMICS: CVP:  [8 mmHg-14 mmHg] 11 mmHg  VENTILATOR SETTINGS: Vent Mode:  [-] PRVC FiO2 (%):  [40 %-50 %] 50 % Set Rate:  [18 bmp-24 bmp] 18 bmp Vt Set:  [640 mL] 640 mL PEEP:  [5 cmH20-10 cmH20] 5 cmH20 Plateau Pressure:  [17 cmH20-24 cmH20] 22 cmH20  INTAKE / OUTPUT: Intake/Output     07/15 0701 - 07/16 0700   I.V. (mL/kg) 1483 (11.4)   NG/GT 621.3   IV Piggyback 300   Total Intake(mL/kg)  2404.2 (18.5)   Urine (mL/kg/hr) 1100 (0.4)   Emesis/NG output 300 (0.1)   Total Output 1400   Net +1004.2        PHYSICAL EXAMINATION: General: ill appearing feverish obese male Neuro:  Alert, follows commands, moves all extremities HEENT:  Pupils reactive, no sinus tenderness, no oral exudate, no LAN Cardiovascular:  Regular, tachycardic, no murmur Lungs:  Decreased BS rt base, scattered rhonchi, no wheeze Abdomen:  Distended, non tender,hypoactive bowel sounds Musculoskeletal:  1+ edema in LE bilaterally to above knee Skin:  No rashes  LABS:  CBC  Recent Labs Lab 07/11/14 0255 07/12/14 0615 07/13/14 0520  WBC 11.1* 10.4 8.8  HGB 11.3* 10.7* 10.5*  HCT 34.9* 31.9* 32.1*  PLT 132* 121* 120*   Coag's No results found for this basename: APTT, INR,  in the last 168 hours  BMET  Recent Labs Lab 07/11/14 0255 07/12/14 0615 07/13/14 0520  NA 142 138 141  K 4.3 3.5* 3.7  CL 103 98 103  CO2 21 20 23   BUN 33* 51* 48*  CREATININE 1.80* 3.14* 1.86*  GLUCOSE 188* 146* 151*   Electrolytes  Recent Labs Lab 07/11/14 0255 07/12/14 0615 07/13/14 0520  CALCIUM 8.2* 8.2* 8.1*  MG  --  1.6 1.8  PHOS  --  1.9* 2.5   Sepsis Markers  Recent Labs Lab 07/10/14 1314 07/10/14 2105 07/12/14 0615  LATICACIDVEN 2.13  --   --   PROCALCITON  --  2.90 11.04  ABG  Recent Labs Lab 07/11/14 2051 07/12/14 0500 07/13/14 0350  PHART 7.364 7.464* 7.409  PCO2ART 36.6 29.0* 38.0  PO2ART 89.0 166.0* 60.6*   Liver Enzymes  Recent Labs Lab 07/10/14 1255  AST 24  ALT 29  ALKPHOS 70  BILITOT 0.7  ALBUMIN 4.3   Cardiac Enzymes  Recent Labs Lab 07/10/14 1255 07/10/14 2105  TROPONINI  --  0.33*  PROBNP 142.2* 1750.0*   Glucose  Recent Labs Lab 07/12/14 0754 07/12/14 1122 07/12/14 1651 07/12/14 1924 07/12/14 2040 07/12/14 2317  GLUCAP 112* 153* 180* 198* 166* 179*   Imaging Dg Chest Port 1 View  07/13/2014   CLINICAL DATA:  Assess endotracheal tube   EXAM: PORTABLE CHEST - 1 VIEW  COMPARISON:  07/12/2014  FINDINGS: Endotracheal tube at the level of the clavicular heads. An orogastric tube likely crosses the diaphragm, as permitted by soft tissue attenuation. Left IJ catheter in stable position.  Unchanged heart size and mediastinal contours. There is hypoaeration with persistent elevation of the right diaphragm. Basilar lung aeration has improved, likely clearing atelectasis. No effusion or pneumothorax.  IMPRESSION: 1. Unchanged positioning of tubes and central line. 2. Improving basilar aeration, although lung volumes remain low.   Electronically Signed   By: Jorje Guild M.D.   On: 07/13/2014 06:10   Dg Chest Port 1 View  07/12/2014   CLINICAL DATA:  Short of breath, intubated  EXAM: PORTABLE CHEST - 1 VIEW  COMPARISON:  Prior chest x-ray 07/11/2014  FINDINGS: The patient remains intubated. The tip of the ET tube is 4.2 cm above the carina. Left IJ central venous catheter also unchanged with the tip at the superior cavoatrial junction. Inspiratory volumes remain extremely low. Slightly increased bibasilar atelectasis since the prior study. Persistent pulmonary vascular congestion without overt edema. No pneumothorax. Posterior spinal stabilization hardware again noted. Incompletely imaged nasogastric tube. The tip lies below the diaphragm likely within the stomach.  IMPRESSION: 1. Persistent extremely low inspiratory volumes with increased bibasilar atelectasis. Developing infiltrates are difficult to exclude radiographically. 2. Slightly increased pulmonary vascular congestion without edema. 3. Stable and satisfactory support apparatus.   Electronically Signed   By: Jacqulynn Cadet M.D.   On: 07/12/2014 07:41   Dg Chest Port 1 View  07/11/2014   CLINICAL DATA:  Check endotracheal tube placement  EXAM: PORTABLE CHEST - 1 VIEW  COMPARISON:  07/11/2014  FINDINGS: Endotracheal tube is stable at 3.3 cm above the carina. A left central venous line is  again seen in the distal superior vena cava. No pneumothorax is noted. A nasogastric catheter is seen although its distal aspect is not visualized on this film. Postsurgical changes are noted. The overall inspiratory effort is poor with right basilar atelectasis.  IMPRESSION: No significant interval change from the prior exam.   Electronically Signed   By: Inez Catalina M.D.   On: 07/11/2014 21:03   Dg Chest Port 1 View  07/11/2014   CLINICAL DATA:  TLC placement  EXAM: PORTABLE CHEST - 1 VIEW  COMPARISON:  Chest radiograph July 11, 2014 at 5:09 a.m.  FINDINGS: Interval placement of internal jugular central venous catheter with distal tip projecting at cavoatrial junction. Endotracheal tube tip projects 3.3 cm above the carina. Nasogastric tube tip not imaged but at least past the midesophagus. Persistently elevated right hemidiaphragm. Crowded vascular markings in this low inspiratory examination with minimal bibasilar atelectasis. No pleural effusions or focal consolidations. No pneumothorax.  Status post cervical posterior instrumentation and laminectomies. Status post  mid to lower thoracic posterior spinal hardware.  IMPRESSION: Interval placement of left internal jugular central venous catheter with distal tip projecting cavoatrial junction. No pneumothorax. No apparent change remaining life support lines.  Low inspiratory examination with bibasilar atelectasis.   Electronically Signed   By: Elon Alas   On: 07/11/2014 12:54    ASSESSMENT / PLAN:  PULMONARY A: Acute hypoxic respiratory failure 2nd to CAP. Hx of OSA. P:   - Begin PS trials today but no extubation until able to diurese. - F/u CXR, ABG. - PRN BD's.  CARDIOVASCULAR A:  Sinus tachycardia 2nd to hypoxia. Hx of HTN. P:  - D/C lopressor. - Follow CVP. - On and off levophed depending on sedation, will keep on med list for now. - F/u Echo EF 60-65%.  RENAL A:   Hypokalemia Hx of BPH.  Cr deteriorating with renal  function P:   - Monitor renal fx, urine outpt, electrolytes. - D/C cardura. - Keep even for now but will need diureses as renal function improves.  GASTROINTESTINAL A:   Nutrition Constipation P:   - Tube feeds while on vent. - Protonix for SUP. - Scheduled reglan.  HEMATOLOGIC A:   No acute issues. P:  - F/u CBC. - Lovenox for DVT prevention.  INFECTIOUS A:   CAP. UTI- 7/15 Multiple bacterial morphotypes, none predominant, 25k colonies/ml, no speciation as of now. P:   - Day 4 rocephin, zithromax, continue for now.  ENDOCRINE A:   Hx of DM type II. Hx of hypothyroidism. Hyperglycemia P:   - SSI. - Continue synthroid.  NEUROLOGIC A:   Sedation. Hx of RLS, peripheral neuropathy, cervical myelopathy. P:   - RASS goal: -1 - PAD 3 with prn versed, fentanyl drip - Continue neurontin, mirapex. - Will start precedex with the hope that we can avoid frequent pushes of versed and streamline BP.  Updated family at bedside, all questions answered.  CC time 35 minutes.  Rush Farmer, M.D. [facility] [place] [facility]. Pager: [phone]. After hours pager: [phone].  07/13/2014, 6:37 AM

## 2014-07-13 NOTE — Progress Notes (Signed)
I saw the patient and agree with the above assessment and plan.     GFR improving.  UOP excellent. K and HCO3 ok.  Off pressors.  Good progress.

## 2014-07-13 NOTE — Progress Notes (Signed)
S: Sedated on vent. Family at bedside. Improved urine output today. Still spiking fevers- 101.3 last night.  O:BP 113/37  Pulse 72  Temp(Src) 100.2 F (37.9 C) (Core (Comment))  Resp 20  Ht 6\' 1"  (1.854 m)  Wt 286 lb 9.6 oz (130 kg)  BMI 37.82 kg/m2  SpO2 95%  Intake/Output Summary (Last 24 hours) at 07/13/14 0842 Last data filed at 07/13/14 0600  Gross per 24 hour  Intake 2342.6 ml  Output   1400 ml  Net  942.6 ml   Intake/Output: I/O last 3 completed shifts: In: 3113.4 [I.V.:2192.2; NG/GT:621.3; IV Piggyback:300] Out: 2225 [Urine:1325; Emesis/NG output:900]  Intake/Output this shift:    Weight change: -4 lb 3 oz (-1.9 kg)  Physical Exam-  Gen: Sedated on vent. CVS: RRR, no murmurs appreciated Resp: No wheezes or crackles appreciated Abd: Soft, full, not tender. Ext: +1 pitting pedal edema to the upper legs. Neuro- Sedated   Recent Labs Lab 07/10/14 1255 07/11/14 0255 07/12/14 0615 07/13/14 0520  NA 142 142 138 141  K 4.1 4.3 3.5* 3.7  CL 103 103 98 103  CO2 24 21 20 23   GLUCOSE 139* 188* 146* 151*  BUN 31* 33* 51* 48*  CREATININE 1.12 1.80* 3.14* 1.86*  ALBUMIN 4.3  --   --   --   CALCIUM 9.3 8.2* 8.2* 8.1*  PHOS  --   --  1.9* 2.5  AST 24  --   --   --   ALT 29  --   --   --    Liver Function Tests:  Recent Labs Lab 07/10/14 1255  AST 24  ALT 29  ALKPHOS 70  BILITOT 0.7  PROT 7.4  ALBUMIN 4.3   CBC:  Recent Labs Lab 07/10/14 1255 07/11/14 0255 07/12/14 0615 07/13/14 0520  WBC 7.4 11.1* 10.4 8.8  NEUTROABS 6.8  --   --   --   HGB 13.2 11.3* 10.7* 10.5*  HCT 39.0 34.9* 31.9* 32.1*  MCV 89.4 91.8 90.6 90.7  PLT 149* 132* 121* 120*   Cardiac Enzymes:  Recent Labs Lab 07/10/14 2105 07/12/14 1400  CKTOTAL  --  1156*  TROPONINI 0.33*  --    CBG:  Recent Labs Lab 07/12/14 1651 07/12/14 1924 07/12/14 2040 07/12/14 2317 07/13/14 0727  GLUCAP 180* 198* 166* 179* 156*    Iron Studies: No results found for this basename:  IRON, TIBC, TRANSFERRIN, FERRITIN,  in the last 72 hours Studies/Results: Dg Chest Port 1 View  07/13/2014   CLINICAL DATA:  Assess endotracheal tube  EXAM: PORTABLE CHEST - 1 VIEW  COMPARISON:  07/12/2014  FINDINGS: Endotracheal tube at the level of the clavicular heads. An orogastric tube likely crosses the diaphragm, as permitted by soft tissue attenuation. Left IJ catheter in stable position.  Unchanged heart size and mediastinal contours. There is hypoaeration with persistent elevation of the right diaphragm. Basilar lung aeration has improved, likely clearing atelectasis. No effusion or pneumothorax.  IMPRESSION: 1. Unchanged positioning of tubes and central line. 2. Improving basilar aeration, although lung volumes remain low.   Electronically Signed   By: Jorje Guild M.D.   On: 07/13/2014 06:10   Dg Chest Port 1 View  07/12/2014   CLINICAL DATA:  Short of breath, intubated  EXAM: PORTABLE CHEST - 1 VIEW  COMPARISON:  Prior chest x-ray 07/11/2014  FINDINGS: The patient remains intubated. The tip of the ET tube is 4.2 cm above the carina. Left IJ central venous catheter also unchanged  with the tip at the superior cavoatrial junction. Inspiratory volumes remain extremely low. Slightly increased bibasilar atelectasis since the prior study. Persistent pulmonary vascular congestion without overt edema. No pneumothorax. Posterior spinal stabilization hardware again noted. Incompletely imaged nasogastric tube. The tip lies below the diaphragm likely within the stomach.  IMPRESSION: 1. Persistent extremely low inspiratory volumes with increased bibasilar atelectasis. Developing infiltrates are difficult to exclude radiographically. 2. Slightly increased pulmonary vascular congestion without edema. 3. Stable and satisfactory support apparatus.   Electronically Signed   By: Jacqulynn Cadet M.D.   On: 07/12/2014 07:41   Dg Chest Port 1 View  07/11/2014   CLINICAL DATA:  Check endotracheal tube placement   EXAM: PORTABLE CHEST - 1 VIEW  COMPARISON:  07/11/2014  FINDINGS: Endotracheal tube is stable at 3.3 cm above the carina. A left central venous line is again seen in the distal superior vena cava. No pneumothorax is noted. A nasogastric catheter is seen although its distal aspect is not visualized on this film. Postsurgical changes are noted. The overall inspiratory effort is poor with right basilar atelectasis.  IMPRESSION: No significant interval change from the prior exam.   Electronically Signed   By: Inez Catalina M.D.   On: 07/11/2014 21:03   Dg Chest Port 1 View  07/11/2014   CLINICAL DATA:  TLC placement  EXAM: PORTABLE CHEST - 1 VIEW  COMPARISON:  Chest radiograph July 11, 2014 at 5:09 a.m.  FINDINGS: Interval placement of internal jugular central venous catheter with distal tip projecting at cavoatrial junction. Endotracheal tube tip projects 3.3 cm above the carina. Nasogastric tube tip not imaged but at least past the midesophagus. Persistently elevated right hemidiaphragm. Crowded vascular markings in this low inspiratory examination with minimal bibasilar atelectasis. No pleural effusions or focal consolidations. No pneumothorax.  Status post cervical posterior instrumentation and laminectomies. Status post mid to lower thoracic posterior spinal hardware.  IMPRESSION: Interval placement of left internal jugular central venous catheter with distal tip projecting cavoatrial junction. No pneumothorax. No apparent change remaining life support lines.  Low inspiratory examination with bibasilar atelectasis.   Electronically Signed   By: Elon Alas   On: 07/11/2014 12:54   . antiseptic oral rinse  15 mL Mouth Rinse QID  . azithromycin  500 mg Intravenous Q24H  . cefTRIAXone (ROCEPHIN)  IV  1 g Intravenous Q24H  . chlorhexidine  15 mL Mouth Rinse BID  . doxazosin  4 mg Per Tube QHS  . enoxaparin (LOVENOX) injection  40 mg Subcutaneous Q24H  . gabapentin  600 mg Per Tube BID  . insulin aspart   0-20 Units Subcutaneous 6 times per day  . levothyroxine  150 mcg Per Tube QAC breakfast  . metoCLOPramide (REGLAN) injection  5 mg Intravenous 3 times per day  . metoprolol tartrate  25 mg Oral BID  . pantoprazole sodium  40 mg Per Tube Q24H  . phosphorus  500 mg Oral BID WC  . pramipexole  0.25 mg Per Tube BID  . sodium chloride  3 mL Intravenous Q12H  . sodium phosphate  1 enema Rectal Once    BMET    Component Value Date/Time   NA 141 07/13/2014 0520   K 3.7 07/13/2014 0520   CL 103 07/13/2014 0520   CO2 23 07/13/2014 0520   GLUCOSE 151* 07/13/2014 0520   BUN 48* 07/13/2014 0520   CREATININE 1.86* 07/13/2014 0520   CALCIUM 8.1* 07/13/2014 0520   GFRNONAA 35* 07/13/2014 0520   GFRAA  41* 07/13/2014 0520   CBC    Component Value Date/Time   WBC 8.8 07/13/2014 0520   RBC 3.54* 07/13/2014 0520   HGB 10.5* 07/13/2014 0520   HCT 32.1* 07/13/2014 0520   PLT 120* 07/13/2014 0520   MCV 90.7 07/13/2014 0520   MCH 29.7 07/13/2014 0520   MCHC 32.7 07/13/2014 0520   RDW 15.1 07/13/2014 0520   LYMPHSABS 0.3* 07/10/2014 1255   MONOABS 0.3 07/10/2014 1255   EOSABS 0.1 07/10/2014 1255   BASOSABS 0.0 07/10/2014 1255     Assessment/Plan:  1. Acute kidney injury- Most likely from ATN from Sepsis. Significant improvement in Cr today- .86 today, from peak of 3.14 yesterday. Urine output significantly improved- to 1189mls today. UA with muddy brown cast,  BUN/Cr ratio- 16.24. Kidneys appears to be improving. Will continue to follow, no dialysis at this time.  - Urine Na pending. - Renal US pending.  - CPK   Acute Respiratory failure with PNA- Intubated on vent. Likely from PNA Vs pulmonary edema. Pt spiking fevers without leukocytosis, after 3 days of Azithro and ceftriaxone.  Diabetes- On SS-R   HTN- Home meds- Lisinopril-HCTZ held, metoprolol also not given. Presently on Levofed.   Kirkland Hun, PGY-2

## 2014-07-14 ENCOUNTER — Inpatient Hospital Stay (HOSPITAL_COMMUNITY): Payer: Medicare Other

## 2014-07-14 LAB — BLOOD GAS, ARTERIAL
Acid-base deficit: 0.3 mmol/L (ref 0.0–2.0)
Bicarbonate: 23.2 mEq/L (ref 20.0–24.0)
DRAWN BY: 31101
FIO2: 40 %
LHR: 18 {breaths}/min
O2 Saturation: 99.4 %
PCO2 ART: 34.1 mmHg — AB (ref 35.0–45.0)
PEEP/CPAP: 5 cmH2O
PH ART: 7.448 (ref 7.350–7.450)
Patient temperature: 98.6
TCO2: 24.3 mmol/L (ref 0–100)
VT: 640 mL
pO2, Arterial: 157 mmHg — ABNORMAL HIGH (ref 80.0–100.0)

## 2014-07-14 LAB — PHOSPHORUS: Phosphorus: 3.4 mg/dL (ref 2.3–4.6)

## 2014-07-14 LAB — GLUCOSE, CAPILLARY
GLUCOSE-CAPILLARY: 193 mg/dL — AB (ref 70–99)
GLUCOSE-CAPILLARY: 198 mg/dL — AB (ref 70–99)
Glucose-Capillary: 205 mg/dL — ABNORMAL HIGH (ref 70–99)
Glucose-Capillary: 184 mg/dL — ABNORMAL HIGH (ref 70–99)
Glucose-Capillary: 202 mg/dL — ABNORMAL HIGH (ref 70–99)
Glucose-Capillary: 204 mg/dL — ABNORMAL HIGH (ref 70–99)

## 2014-07-14 LAB — BASIC METABOLIC PANEL
Anion gap: 16 — ABNORMAL HIGH (ref 5–15)
BUN: 49 mg/dL — AB (ref 6–23)
CALCIUM: 8.3 mg/dL — AB (ref 8.4–10.5)
CO2: 23 mEq/L (ref 19–32)
CREATININE: 1.38 mg/dL — AB (ref 0.50–1.35)
Chloride: 105 mEq/L (ref 96–112)
GFR, EST AFRICAN AMERICAN: 59 mL/min — AB (ref >90)
GFR, EST NON AFRICAN AMERICAN: 51 mL/min — AB (ref >90)
GLUCOSE: 201 mg/dL — AB (ref 70–99)
Potassium: 3.5 mEq/L — ABNORMAL LOW (ref 3.7–5.3)
Sodium: 144 mEq/L (ref 137–147)

## 2014-07-14 LAB — CBC
HEMATOCRIT: 31.6 % — AB (ref 39.0–52.0)
HEMOGLOBIN: 10.5 g/dL — AB (ref 13.0–17.0)
MCH: 30.4 pg (ref 26.0–34.0)
MCHC: 33.2 g/dL (ref 30.0–36.0)
MCV: 91.6 fL (ref 78.0–100.0)
Platelets: 116 10*3/uL — ABNORMAL LOW (ref 150–400)
RBC: 3.45 MIL/uL — ABNORMAL LOW (ref 4.22–5.81)
RDW: 15.4 % (ref 11.5–15.5)
WBC: 5 10*3/uL (ref 4.0–10.5)

## 2014-07-14 LAB — PROCALCITONIN: Procalcitonin: 3.08 ng/mL

## 2014-07-14 LAB — MAGNESIUM: Magnesium: 2.5 mg/dL (ref 1.5–2.5)

## 2014-07-14 MED ORDER — POTASSIUM CHLORIDE 20 MEQ/15ML (10%) PO LIQD
40.0000 meq | Freq: Three times a day (TID) | ORAL | Status: AC
  Administered 2014-07-14 (×3): 40 meq
  Filled 2014-07-14 (×3): qty 30

## 2014-07-14 MED ORDER — FUROSEMIDE 10 MG/ML IJ SOLN
8.0000 mg/h | INTRAVENOUS | Status: DC
  Administered 2014-07-14: 8 mg/h via INTRAVENOUS
  Filled 2014-07-14: qty 25

## 2014-07-14 MED ORDER — SORBITOL 70 % SOLN
960.0000 mL | TOPICAL_OIL | Freq: Once | ORAL | Status: AC
  Administered 2014-07-14: 960 mL via RECTAL
  Filled 2014-07-14: qty 240

## 2014-07-14 MED ORDER — POTASSIUM CHLORIDE 20 MEQ/15ML (10%) PO LIQD
20.0000 meq | ORAL | Status: AC
  Administered 2014-07-14 (×2): 20 meq
  Filled 2014-07-14 (×2): qty 15

## 2014-07-14 MED ORDER — GABAPENTIN 250 MG/5ML PO SOLN
600.0000 mg | Freq: Three times a day (TID) | ORAL | Status: DC
  Administered 2014-07-14 – 2014-07-15 (×3): 600 mg
  Filled 2014-07-14 (×6): qty 12

## 2014-07-14 MED ORDER — METOLAZONE 5 MG PO TABS
5.0000 mg | ORAL_TABLET | Freq: Every day | ORAL | Status: AC
  Administered 2014-07-14: 5 mg via ORAL
  Filled 2014-07-14: qty 1

## 2014-07-14 MED ORDER — PROPOFOL 10 MG/ML IV EMUL
5.0000 ug/kg/min | INTRAVENOUS | Status: DC
  Administered 2014-07-14 (×2): 20 ug/kg/min via INTRAVENOUS
  Administered 2014-07-15: 35 ug/kg/min via INTRAVENOUS
  Administered 2014-07-15: 30 ug/kg/min via INTRAVENOUS
  Administered 2014-07-15: 35 ug/kg/min via INTRAVENOUS
  Filled 2014-07-14 (×4): qty 100

## 2014-07-14 NOTE — Progress Notes (Signed)
I saw the patient and agree with the above assessment and plan.    AKI resolving.  Decent UOP.  On lasix gtt w/ CCM, he should respond.  Will s/o for now.  RS

## 2014-07-14 NOTE — Progress Notes (Signed)
PULMONARY / CRITICAL CARE MEDICINE   Name: Travis [name] MRN: [id number] DOB: Mar 28, 1946    ADMISSION DATE:  07/10/2014 CONSULTATION DATE:  07/10/2014  REFERRING MD :  Sloan Leiter  CHIEF COMPLAINT:  Short of breath  BRIEF PATIENT DESCRIPTION:  68 yo male former smoker presented with fever (Tm 101F), chills, dyspnea, muscle aches x one day.  CXR showed pneumonia, and u/a suggested UTI.  He developed progressive respiratory failure requiring intubation.  PCCM assumed care in ICU.  SIGNIFICANT EVENTS: 7/13 Admit, failed BiPAP, intubated  STUDIES:  CXR 7/14 >>mild bibasilar atelectasis, no pneumothorax or pleural effusion CXR 7/15>>low lung volumes, developing infiltrates difficult to exclude, slightly elevated pulmonary vascular congestion without edema  LINES / TUBES: ETT 7/13 >>  R IJ TLC 7/14>>> R radial a-line 7/14>>>7/15  CULTURES: Blood 7/13 >> Sputum 7/13 >>  Urine 7/13 >> 7/15 Multiple bacterial morphotypes, none predominant, 25k colonies/ml  ANTIBIOTICS: Rocephin 7/13 >> Zithromax 7/13 >>   SUBJECTIVE: Agitated overnight, on precedex and fentanyl.  VITAL SIGNS: Temp:  [100.4 F (38 C)-101.7 F (38.7 C)] 101.1 F (38.4 C) (07/17 1100) Pulse Rate:  [58-68] 62 (07/17 0755) Resp:  [18-23] 18 (07/17 1100) BP: (102-158)/(40-64) 134/49 mmHg (07/17 1100) SpO2:  [92 %-98 %] 95 % (07/17 1100) FiO2 (%):  [40 %] 40 % (07/17 0800)  HEMODYNAMICS: CVP:  [13 mmHg-15 mmHg] 13 mmHg  VENTILATOR SETTINGS: Vent Mode:  [-] PRVC FiO2 (%):  [40 %] 40 % Set Rate:  [18 bmp] 18 bmp Vt Set:  [640 mL] 640 mL PEEP:  [5 cmH20] 5 cmH20 Pressure Support:  [10 cmH20] 10 cmH20 Plateau Pressure:  [17 cmH20-20 cmH20] 18 cmH20  INTAKE / OUTPUT: Intake/Output     07/16 0701 - 07/17 0700 07/17 0701 - 07/18 0700   I.V. (mL/kg) 1282.9 (9.9) 112 (0.9)   NG/GT 1735 210   IV Piggyback 600    Total Intake(mL/kg) 3617.9 (27.8) 322 (2.5)   Urine (mL/kg/hr) 1125 (0.4)    Emesis/NG output     Total Output 1125     Net +2492.9 +322         PHYSICAL EXAMINATION: General: ill appearing agitated but following commands Neuro:  Alert, follows commands, moves all extremities HEENT:  Pupils reactive, no sinus tenderness, no oral exudate, no LAN Cardiovascular:  Regular, tachycardic, no murmur Lungs:  Diffuse crackles. Abdomen:  Distended, non tender,hypoactive bowel sounds Musculoskeletal:  1+ edema in LE bilaterally to above knee Skin:  No rashes  LABS:  CBC  Recent Labs Lab 07/12/14 0615 07/13/14 0520 07/14/14 0335  WBC 10.4 8.8 5.0  HGB 10.7* 10.5* 10.5*  HCT 31.9* 32.1* 31.6*  PLT 121* 120* 116*   Coag's No results found for this basename: APTT, INR,  in the last 168 hours  BMET  Recent Labs Lab 07/12/14 0615 07/13/14 0520 07/14/14 0335  NA 138 141 144  K 3.5* 3.7 3.5*  CL 98 103 105  CO2 20 23 23   BUN 51* 48* 49*  CREATININE 3.14* 1.86* 1.38*  GLUCOSE 146* 151* 201*   Electrolytes  Recent Labs Lab 07/12/14 0615 07/13/14 0520 07/14/14 0335  CALCIUM 8.2* 8.1* 8.3*  MG 1.6 1.8 2.5  PHOS 1.9* 2.5 3.4   Sepsis Markers  Recent Labs Lab 07/10/14 1314 07/10/14 2105 07/12/14 0615 07/14/14 0335  LATICACIDVEN 2.13  --   --   --   PROCALCITON  --  2.90 11.04 3.08   ABG  Recent Labs Lab 07/12/14 0500 07/13/14 0350 07/14/14  0335  PHART 7.464* 7.409 7.448  PCO2ART 29.0* 38.0 34.1*  PO2ART 166.0* 60.6* 157.0*   Liver Enzymes  Recent Labs Lab 07/10/14 1255  AST 24  ALT 29  ALKPHOS 70  BILITOT 0.7  ALBUMIN 4.3   Cardiac Enzymes  Recent Labs Lab 07/10/14 1255 07/10/14 2105  TROPONINI  --  0.33*  PROBNP 142.2* 1750.0*   Glucose  Recent Labs Lab 07/13/14 1136 07/13/14 1632 07/13/14 2116 07/13/14 2345 07/14/14 0337 07/14/14 0810  GLUCAP 164* 210* 196* 198* 205* 184*   Imaging US Renal  07/14/2014   CLINICAL DATA:  AKI, elevated creatinine, hypertension, diabetes  EXAM: RENAL/URINARY TRACT ULTRASOUND COMPLETE   COMPARISON:  None available  FINDINGS: Right Kidney:  Length: 11.2 cm. Echogenicity within normal limits. No mass or hydronephrosis visualized.  Left Kidney:  Length: 12.6 cm. Echogenicity within normal limits. No mass or hydronephrosis visualized.  Bladder:  Decompressed by Foley catheter  IMPRESSION: Negative.  No hydronephrosis.   Electronically Signed   By: Arne Cleveland M.D.   On: 07/14/2014 08:00   Dg Chest Port 1 View  07/14/2014   CLINICAL DATA:  Check endotracheal tube placement  EXAM: PORTABLE CHEST - 1 VIEW  COMPARISON:  07/13/2014  FINDINGS: Cardiac shadow is stable. Postsurgical changes are again seen. An endotracheal tube, left jugular catheter and nasogastric catheter are again seen and stable. The endotracheal tube lies approximately 4.9 cm above the carina. Bibasilar atelectatic changes are noted. No focal confluent infiltrate is seen.  IMPRESSION: Tubes and lines as described above.  Mild bibasilar atelectasis.   Electronically Signed   By: Inez Catalina M.D.   On: 07/14/2014 08:00   Dg Chest Port 1 View  07/13/2014   CLINICAL DATA:  Assess endotracheal tube  EXAM: PORTABLE CHEST - 1 VIEW  COMPARISON:  07/12/2014  FINDINGS: Endotracheal tube at the level of the clavicular heads. An orogastric tube likely crosses the diaphragm, as permitted by soft tissue attenuation. Left IJ catheter in stable position.  Unchanged heart size and mediastinal contours. There is hypoaeration with persistent elevation of the right diaphragm. Basilar lung aeration has improved, likely clearing atelectasis. No effusion or pneumothorax.  IMPRESSION: 1. Unchanged positioning of tubes and central line. 2. Improving basilar aeration, although lung volumes remain low.   Electronically Signed   By: Jorje Guild M.D.   On: 07/13/2014 06:10    ASSESSMENT / PLAN:  PULMONARY A: Acute hypoxic respiratory failure 2nd to CAP. Hx of OSA. P:   - Continue PS trials today but no extubation until able to diurese. -  F/u CXR, ABG. - PRN BD's. - Diureses as below.  CARDIOVASCULAR A:  Sinus tachycardia 2nd to hypoxia. Hx of HTN. P:  - CVP 15. - Off levophed. - F/u Echo EF 60-65%.  RENAL A:   Hypokalemia Hx of BPH.  Cr deteriorating with renal function P:   - Monitor renal fx, urine outpt, electrolytes. - Lasix drip at 8 mg/hr x24 hours. - Zaroxolyn 5 mg PO x1. - Replace electrolytes as indicated.  GASTROINTESTINAL A:   Nutrition Constipation P:   - Tube feeds while on vent. - Protonix for SUP. - SMOG enema today.  HEMATOLOGIC A:   No acute issues. P:  - F/u CBC. - Lovenox for DVT prevention.  INFECTIOUS A:   CAP. UTI- 7/15 Multiple bacterial morphotypes, none predominant, 25k colonies/ml, no speciation as of now. P:   - Day 5 rocephin, zithromax, continue for now.  ENDOCRINE A:   Hx  of DM type II. Hx of hypothyroidism. Hyperglycemia P:   - SSI. - Continue synthroid.  NEUROLOGIC A:   Sedation. Hx of RLS, peripheral neuropathy, cervical myelopathy. P:   - RASS goal: -1 - Continue neurontin, mirapex. - Continue precedex and fentanyl GTT.  Updated family at bedside, all questions answered.  CC time 35 minutes.  Rush Farmer, M.D. [facility] Pulmonary/Critical Care Medicine. Pager: [phone]. After hours pager: [phone].  07/14/2014, 11:22 AM

## 2014-07-14 NOTE — Progress Notes (Signed)
Inpatient Diabetes Program Recommendations  AACE/ADA: New Consensus Statement on Inpatient Glycemic Control (2013)  Target Ranges:  Prepandial:   less than 140 mg/dL      Peak postprandial:   less than 180 mg/dL (1-2 hours)      Critically ill patients:  140 - 180 mg/dL     Results for Travis [name], Travis [name] (MRN [id number]) as of 07/14/2014 14:27  Ref. Range 07/13/2014 23:45 07/14/2014 03:37 07/14/2014 08:10 07/14/2014 12:17  Glucose-Capillary Latest Range: 70-99 mg/dL 198 (H) 205 (H) 184 (H) 202 (H)     Note tube feeds started yesterday.  Tube feeds almost at goal.  Patient having glucose elevations.  Currently only getting Novolog Resistant SSI Q4 hours.    MD- Once tube feeds at goal, please consider adding Novolog tube feed coverage- Novolog 3 units Q 4 hours     Will follow Wyn Quaker RN, MSN, CDE Diabetes Coordinator Inpatient Diabetes Program Team Pager: [phone] (8a-10p)

## 2014-07-14 NOTE — Progress Notes (Addendum)
PULMONARY / CRITICAL CARE MEDICINE   Name: Travis [name] MRN: [id number] DOB: Aug 05, 1946    ADMISSION DATE:  07/10/2014 CONSULTATION DATE:  07/10/2014  REFERRING MD :  Sloan Leiter  CHIEF COMPLAINT:  Short of breath  BRIEF PATIENT DESCRIPTION: 68 yo former smoker admitted 7/13 with pneumonia and respiratory failure requiring intubation.  SIGNIFICANT STUDIES / EVENTS: 7/15  TTE >>> EF 65%  LINES / TUBES: ETT 7/13 >>> OETT 7/13 >>> L IJ TLC 7/14 >> R R AL >>> 7/15  CULTURES: 7/13  Blood  >>> 7/14  Respiratory  >>> npof 7/13  Urine  >>> multiple morphotypes   ANTIBIOTICS: Rocephin 7/13 >>> Zithromax 7/13 >>>  INTERVAL HISTORY:  No acute events  VITAL SIGNS: Temp:  [98.4 F (36.9 C)-101.5 F (38.6 C)] 98.7 F (37.1 C) (07/18 0736) Pulse Rate:  [60-68] 64 (07/18 0835) Resp:  [15-22] 18 (07/18 0900) BP: (80-144)/(29-112) 127/36 mmHg (07/18 0900) SpO2:  [90 %-96 %] 93 % (07/18 0900) FiO2 (%):  [40 %-50 %] 40 % (07/18 0835) Weight:  [131.9 kg (290 lb 12.6 oz)] 131.9 kg (290 lb 12.6 oz) (07/18 0340)  HEMODYNAMICS: CVP:  [12 mmHg-15 mmHg] 12 mmHg  VENTILATOR SETTINGS: Vent Mode:  [-] PRVC FiO2 (%):  [40 %-50 %] 40 % Set Rate:  [18 bmp] 18 bmp Vt Set:  [640 mL] 640 mL PEEP:  [5 cmH20] 5 cmH20 Pressure Support:  [10 cmH20] 10 cmH20 Plateau Pressure:  [14 cmH20-22 cmH20] 14 cmH20  INTAKE / OUTPUT: Intake/Output     07/17 0701 - 07/18 0700 07/18 0701 - 07/19 0700   I.V. (mL/kg) 1621.3 (12.3) 165.6 (1.3)   NG/GT 1890 150   IV Piggyback 300    Total Intake(mL/kg) 3811.3 (28.9) 315.6 (2.4)   Urine (mL/kg/hr) 3900 (1.2)    Stool 1 (0)    Total Output 3901     Net -89.7 +315.6         PHYSICAL EXAMINATION: General:  Mechanically ventilated, synchronous Neuro:  Sedated, nonfocal, cough / gag diminished HEENT:  PERRL, OETT / OGT Cardiovascular:  Regular, tachycardic, no murmurs Lungs:  Bilateral diminished air entry, rales, rhonchi Abdomen:  Soft, nontender, bowel  sounds diminished Musculoskeletal:  BL LE edema Skin:  Intact  LABS:  CBC  Recent Labs Lab 07/13/14 0520 07/14/14 0335 07/15/14 0405  WBC 8.8 5.0 5.1  HGB 10.5* 10.5* 10.0*  HCT 32.1* 31.6* 30.8*  PLT 120* 116* 96*   Coag's No results found for this basename: APTT, INR,  in the last 168 hours  BMET  Recent Labs Lab 07/13/14 0520 07/14/14 0335 07/15/14 0405  NA 141 144 144  K 3.7 3.5* 4.5  CL 103 105 104  CO2 23 23 25   BUN 48* 49* 50*  CREATININE 1.86* 1.38* 1.47*  GLUCOSE 151* 201* 174*   Electrolytes  Recent Labs Lab 07/13/14 0520 07/14/14 0335 07/15/14 0405  CALCIUM 8.1* 8.3* 8.1*  MG 1.8 2.5 2.2  PHOS 2.5 3.4 3.4   Sepsis Markers  Recent Labs Lab 07/10/14 1314 07/10/14 2105 07/12/14 0615 07/14/14 0335  LATICACIDVEN 2.13  --   --   --   PROCALCITON  --  2.90 11.04 3.08   ABG  Recent Labs Lab 07/13/14 0350 07/14/14 0335 07/15/14 0500  PHART 7.409 7.448 7.431  PCO2ART 38.0 34.1* 38.9  PO2ART 60.6* 157.0* 67.0*   Liver Enzymes  Recent Labs Lab 07/10/14 1255  AST 24  ALT 29  ALKPHOS 70  BILITOT 0.7  ALBUMIN 4.3  Cardiac Enzymes  Recent Labs Lab 07/10/14 1255 07/10/14 2105  TROPONINI  --  0.33*  PROBNP 142.2* 1750.0*   Glucose  Recent Labs Lab 07/14/14 1217 07/14/14 1722 07/14/14 2107 07/14/14 2357 07/15/14 0406 07/15/14 0739  GLUCAP 202* 204* 193* 192* 174* 189*   IMAGING:   US Renal  07/14/2014   CLINICAL DATA:  AKI, elevated creatinine, hypertension, diabetes  EXAM: RENAL/URINARY TRACT ULTRASOUND COMPLETE  COMPARISON:  None available  FINDINGS: Right Kidney:  Length: 11.2 cm. Echogenicity within normal limits. No mass or hydronephrosis visualized.  Left Kidney:  Length: 12.6 cm. Echogenicity within normal limits. No mass or hydronephrosis visualized.  Bladder:  Decompressed by Foley catheter  IMPRESSION: Negative.  No hydronephrosis.   Electronically Signed   By: Arne Cleveland M.D.   On: 07/14/2014 08:00   Dg  Chest Port 1 View  07/15/2014   CLINICAL DATA:  Intubated patient  EXAM: PORTABLE CHEST - 1 VIEW  COMPARISON:  07/14/2014  FINDINGS: Lung base opacity has slightly improved from the previous day's study, more notable on the right, consistent with improved atelectasis. No new lung opacities. No evidence of edema.  Cardiac silhouette is normal in size.  No pneumothorax.  Endotracheal tube, nasogastric tube and left internal jugular central venous line are stable and well positioned.  IMPRESSION: Slight improvement in lung aeration from the previous day's study. No other change.  Support apparatus is stable and well positioned.   Electronically Signed   By: Lajean Manes M.D.   On: 07/15/2014 07:09   Dg Chest Port 1 View  07/14/2014   CLINICAL DATA:  Check endotracheal tube placement  EXAM: PORTABLE CHEST - 1 VIEW  COMPARISON:  07/13/2014  FINDINGS: Cardiac shadow is stable. Postsurgical changes are again seen. An endotracheal tube, left jugular catheter and nasogastric catheter are again seen and stable. The endotracheal tube lies approximately 4.9 cm above the carina. Bibasilar atelectatic changes are noted. No focal confluent infiltrate is seen.  IMPRESSION: Tubes and lines as described above.  Mild bibasilar atelectasis.   Electronically Signed   By: Inez Catalina M.D.   On: 07/14/2014 08:00   ASSESSMENT / PLAN:  PULMONARY A: Acute hypoxemic respiratory failure Pulmonary edema Possible pneumonia ( CAP ) OSA P:   Goal pH>7.30, SpO2>92 Continuous mechanical support VAP bundle Daily SBT, mental status is limiting extubation Trend ABG/CXR Albuterol PRN  CARDIOVASCULAR A:  Probable acute on chronic diastolic heart failure Hypotension, likely sedation-related P:  Goal MAP>65 D/c Cardura / Lopressor Levophed gtt  RENAL A:   AKI Positive stay fluid balance ( + 6 L, CVP 12 ) BPH P:   Trend BMP D/c Lasix gtt Start Lasix 60 q6h Start Zaroxolyn 5 mg PO daily  GASTROINTESTINAL A:    Nutrition Ileus GI Px P:   NPO TF Protonix D/c Reglan  HEMATOLOGIC A:   Anemia Thrombocytopenia VTE Px P:  Trend CBC Lovenox  INFECTIOUS A:   CAP. P:   Abx / cx as above  ENDOCRINE A:   DM Hyperglycemia Hypothyroidism  P:   SSI Levothyroxine   NEUROLOGIC A:   ICU pain / sedation Restless leg syndrome Peripheral neuropathy Cervical myelopathy P:   RASS goal 0 to -1 Start Precedex gtt Start Risperdal   D/c Propofol gtt Fentanyl gtt, titrate down Versed PRN Decrease Neurontin to 300 mg q8h Mirapex  I have personally obtained history, examined patient, evaluated and interpreted laboratory and imaging results, reviewed medical records, formulated assessment / plan and placed  orders.  CRITICAL CARE:  The patient is critically ill with multiple organ systems failure and requires high complexity decision making for assessment and support, frequent evaluation and titration of therapies, application of advanced monitoring technologies and extensive interpretation of multiple databases. Critical Care Time devoted to patient care services described in this note is 45 minutes.   Doree Fudge, MD Pulmonary and [place] Pager: [phone]  07/15/2014, 10:01 AM

## 2014-07-14 NOTE — Progress Notes (Signed)
S: No events overnight. Drowsy today but follows command and responds to questions, on precedex and fentanyl. Still spiking fevers- 101.7 over night.  O:BP 135/60  Pulse 62  Temp(Src) 101.1 F (38.4 C) (Core (Comment))  Resp 18  Ht 6\' 1"  (1.854 m)  Wt 286 lb 9.6 oz (130 kg)  BMI 37.82 kg/m2  SpO2 93%  Intake/Output Summary (Last 24 hours) at 07/14/14 0912 Last data filed at 07/14/14 0600  Gross per 24 hour  Intake 3155.2 ml  Output    875 ml  Net 2280.2 ml   Intake/Output: I/O last 3 completed shifts: In: 4687.5 [I.V.:2057.5; NG/GT:2030; IV Piggyback:600] Out: [id number] [Urine:1725]  Intake/Output this shift:    Weight change:   Physical Exam-  Gen: awake but drowsy. CVS: RRR, no murmurs appreciated Resp: On vent, no crackles or wheezes, breath sounds coarse. Abd: Soft, full, not tender. Ext: +1 pitting pedal edema to the upper legs. Neuro- Follows commands.  Recent Labs Lab 07/10/14 1255 07/11/14 0255 07/12/14 0615 07/13/14 0520 07/14/14 0335  NA 142 142 138 141 144  K 4.1 4.3 3.5* 3.7 3.5*  CL 103 103 98 103 105  CO2 24 21 20 23 23   GLUCOSE 139* 188* 146* 151* 201*  BUN 31* 33* 51* 48* 49*  CREATININE 1.12 1.80* 3.14* 1.86* 1.38*  ALBUMIN 4.3  --   --   --   --   CALCIUM 9.3 8.2* 8.2* 8.1* 8.3*  PHOS  --   --  1.9* 2.5 3.4  AST 24  --   --   --   --   ALT 29  --   --   --   --    Liver Function Tests:  Recent Labs Lab 07/10/14 1255  AST 24  ALT 29  ALKPHOS 70  BILITOT 0.7  PROT 7.4  ALBUMIN 4.3   CBC:  Recent Labs Lab 07/10/14 1255 07/11/14 0255 07/12/14 0615 07/13/14 0520 07/14/14 0335  WBC 7.4 11.1* 10.4 8.8 5.0  NEUTROABS 6.8  --   --   --   --   HGB 13.2 11.3* 10.7* 10.5* 10.5*  HCT 39.0 34.9* 31.9* 32.1* 31.6*  MCV 89.4 91.8 90.6 90.7 91.6  PLT 149* 132* 121* 120* 116*   Cardiac Enzymes:  Recent Labs Lab 07/10/14 2105 07/12/14 1400  CKTOTAL  --  1156*  TROPONINI 0.33*  --    CBG:  Recent Labs Lab 07/13/14 1136  07/13/14 1632 07/13/14 2116 07/13/14 2345 07/14/14 0810  GLUCAP 164* 210* 196* 198* 184*    Iron Studies: No results found for this basename: IRON, TIBC, TRANSFERRIN, FERRITIN,  in the last 72 hours Studies/Results: US Renal  07/14/2014   CLINICAL DATA:  AKI, elevated creatinine, hypertension, diabetes  EXAM: RENAL/URINARY TRACT ULTRASOUND COMPLETE  COMPARISON:  None available  FINDINGS: Right Kidney:  Length: 11.2 cm. Echogenicity within normal limits. No mass or hydronephrosis visualized.  Left Kidney:  Length: 12.6 cm. Echogenicity within normal limits. No mass or hydronephrosis visualized.  Bladder:  Decompressed by Foley catheter  IMPRESSION: Negative.  No hydronephrosis.   Electronically Signed   By: Arne Cleveland M.D.   On: 07/14/2014 08:00   Dg Chest Port 1 View  07/14/2014   CLINICAL DATA:  Check endotracheal tube placement  EXAM: PORTABLE CHEST - 1 VIEW  COMPARISON:  07/13/2014  FINDINGS: Cardiac shadow is stable. Postsurgical changes are again seen. An endotracheal tube, left jugular catheter and nasogastric catheter are again seen and stable. The endotracheal tube  lies approximately 4.9 cm above the carina. Bibasilar atelectatic changes are noted. No focal confluent infiltrate is seen.  IMPRESSION: Tubes and lines as described above.  Mild bibasilar atelectasis.   Electronically Signed   By: Inez Catalina M.D.   On: 07/14/2014 08:00   Dg Chest Port 1 View  07/13/2014   CLINICAL DATA:  Assess endotracheal tube  EXAM: PORTABLE CHEST - 1 VIEW  COMPARISON:  07/12/2014  FINDINGS: Endotracheal tube at the level of the clavicular heads. An orogastric tube likely crosses the diaphragm, as permitted by soft tissue attenuation. Left IJ catheter in stable position.  Unchanged heart size and mediastinal contours. There is hypoaeration with persistent elevation of the right diaphragm. Basilar lung aeration has improved, likely clearing atelectasis. No effusion or pneumothorax.  IMPRESSION: 1.  Unchanged positioning of tubes and central line. 2. Improving basilar aeration, although lung volumes remain low.   Electronically Signed   By: Jorje Guild M.D.   On: 07/13/2014 06:10   . antiseptic oral rinse  15 mL Mouth Rinse QID  . azithromycin  500 mg Intravenous Q24H  . cefTRIAXone (ROCEPHIN)  IV  1 g Intravenous Q24H  . chlorhexidine  15 mL Mouth Rinse BID  . doxazosin  4 mg Per Tube QHS  . enoxaparin (LOVENOX) injection  40 mg Subcutaneous Q24H  . gabapentin  600 mg Per Tube 3 times per day  . insulin aspart  0-20 Units Subcutaneous 6 times per day  . levothyroxine  150 mcg Per Tube QAC breakfast  . metoCLOPramide (REGLAN) injection  5 mg Intravenous 3 times per day  . metoprolol tartrate  25 mg Oral BID  . pantoprazole sodium  40 mg Per Tube Q24H  . pramipexole  0.25 mg Per Tube BID  . sodium chloride  3 mL Intravenous Q12H    BMET    Component Value Date/Time   NA 144 07/14/2014 0335   K 3.5* 07/14/2014 0335   CL 105 07/14/2014 0335   CO2 23 07/14/2014 0335   GLUCOSE 201* 07/14/2014 0335   BUN 49* 07/14/2014 0335   CREATININE 1.38* 07/14/2014 0335   CALCIUM 8.3* 07/14/2014 0335   GFRNONAA 51* 07/14/2014 0335   GFRAA 59* 07/14/2014 0335   CBC    Component Value Date/Time   WBC 5.0 07/14/2014 0335   RBC 3.45* 07/14/2014 0335   HGB 10.5* 07/14/2014 0335   HCT 31.6* 07/14/2014 0335   PLT 116* 07/14/2014 0335   MCV 91.6 07/14/2014 0335   MCH 30.4 07/14/2014 0335   MCHC 33.2 07/14/2014 0335   RDW 15.4 07/14/2014 0335   LYMPHSABS 0.3* 07/10/2014 1255   MONOABS 0.3 07/10/2014 1255   EOSABS 0.1 07/10/2014 1255   BASOSABS 0.0 07/10/2014 1255     Assessment/Plan:  Acute kidney injury- Resolving, likely from ATN from Sepsis. Improvement in Cr today- 1.38 today, nearing baseline- 1.03- 1.11, today, from peak of 3.14 this admission. Urine output- 1,125, over the past 24hrs (46.58mls/hr). Kidney function almost at baseline. Bp stable without pressors. - Consider signing off today, will  not require nephrology follow up on discharge.  Acute Respiratory failure with PNA- Intubated on vent. Likely from PNA Vs pulmonary edema. Pt spiking fevers without leukocytosis, after 4 days of Azithro and ceftriaxone.   Diabetes- On SS-R   HTN- Home meds- Lisinopril-HCTZ held. Metoprolol 25mg  BID and doxazosin- 4mg  daily restarted.   Kirkland Hun, PGY-2

## 2014-07-14 NOTE — Progress Notes (Signed)
[facility] ADULT ICU REPLACEMENT PROTOCOL FOR AM LAB REPLACEMENT ONLY  The patient does apply for the [facility] Adult ICU Electrolyte Replacment Protocol based on the criteria listed below:   1. Is GFR >/= 40 ml/min? Yes.    Patient's GFR today is 51 2. Is urine output >/= 0.5 ml/kg/hr for the last 6 hours? Yes.   Patient's UOP is 0.54 ml/kg/hr 3. Is BUN < 60 mg/dL? Yes.    Patient's BUN today is 49 4. Abnormal electrolyte(s):Potassium 5. Ordered repletion with: Potassium  Travis [name] P 07/14/2014 5:17 AM

## 2014-07-15 ENCOUNTER — Inpatient Hospital Stay (HOSPITAL_COMMUNITY): Payer: Medicare Other

## 2014-07-15 LAB — BLOOD GAS, ARTERIAL
ACID-BASE EXCESS: 1.6 mmol/L (ref 0.0–2.0)
Bicarbonate: 25.5 mEq/L — ABNORMAL HIGH (ref 20.0–24.0)
DRAWN BY: 41934
FIO2: 40 %
LHR: 18 {breaths}/min
MECHVT: 640 mL
O2 SAT: 94 %
PATIENT TEMPERATURE: 98.6
PEEP: 5 cmH2O
TCO2: 26.7 mmol/L (ref 0–100)
pCO2 arterial: 38.9 mmHg (ref 35.0–45.0)
pH, Arterial: 7.431 (ref 7.350–7.450)
pO2, Arterial: 67 mmHg — ABNORMAL LOW (ref 80.0–100.0)

## 2014-07-15 LAB — BASIC METABOLIC PANEL
ANION GAP: 15 (ref 5–15)
BUN: 50 mg/dL — ABNORMAL HIGH (ref 6–23)
CO2: 25 meq/L (ref 19–32)
Calcium: 8.1 mg/dL — ABNORMAL LOW (ref 8.4–10.5)
Chloride: 104 mEq/L (ref 96–112)
Creatinine, Ser: 1.47 mg/dL — ABNORMAL HIGH (ref 0.50–1.35)
GFR calc Af Amer: 54 mL/min — ABNORMAL LOW (ref >90)
GFR calc non Af Amer: 47 mL/min — ABNORMAL LOW (ref >90)
Glucose, Bld: 174 mg/dL — ABNORMAL HIGH (ref 70–99)
POTASSIUM: 4.5 meq/L (ref 3.7–5.3)
SODIUM: 144 meq/L (ref 137–147)

## 2014-07-15 LAB — CBC
HEMATOCRIT: 30.8 % — AB (ref 39.0–52.0)
Hemoglobin: 10 g/dL — ABNORMAL LOW (ref 13.0–17.0)
MCH: 29.7 pg (ref 26.0–34.0)
MCHC: 32.5 g/dL (ref 30.0–36.0)
MCV: 91.4 fL (ref 78.0–100.0)
Platelets: 96 10*3/uL — ABNORMAL LOW (ref 150–400)
RBC: 3.37 MIL/uL — ABNORMAL LOW (ref 4.22–5.81)
RDW: 15.5 % (ref 11.5–15.5)
WBC: 5.1 10*3/uL (ref 4.0–10.5)

## 2014-07-15 LAB — GLUCOSE, CAPILLARY
GLUCOSE-CAPILLARY: 189 mg/dL — AB (ref 70–99)
GLUCOSE-CAPILLARY: 192 mg/dL — AB (ref 70–99)
Glucose-Capillary: 174 mg/dL — ABNORMAL HIGH (ref 70–99)
Glucose-Capillary: 232 mg/dL — ABNORMAL HIGH (ref 70–99)
Glucose-Capillary: 292 mg/dL — ABNORMAL HIGH (ref 70–99)
Glucose-Capillary: 203 mg/dL — ABNORMAL HIGH (ref 70–99)

## 2014-07-15 LAB — PHOSPHORUS: Phosphorus: 3.4 mg/dL (ref 2.3–4.6)

## 2014-07-15 LAB — TRIGLYCERIDES: Triglycerides: 123 mg/dL (ref <150)

## 2014-07-15 LAB — MAGNESIUM: Magnesium: 2.2 mg/dL (ref 1.5–2.5)

## 2014-07-15 MED ORDER — FUROSEMIDE 10 MG/ML IJ SOLN
INTRAMUSCULAR | Status: AC
  Filled 2014-07-15: qty 8

## 2014-07-15 MED ORDER — PROPOFOL 10 MG/ML IV EMUL
INTRAVENOUS | Status: AC
  Filled 2014-07-15: qty 100

## 2014-07-15 MED ORDER — METOLAZONE 5 MG PO TABS
5.0000 mg | ORAL_TABLET | Freq: Every day | ORAL | Status: AC
  Administered 2014-07-15: 5 mg via ORAL
  Filled 2014-07-15: qty 1

## 2014-07-15 MED ORDER — GABAPENTIN 250 MG/5ML PO SOLN
300.0000 mg | Freq: Three times a day (TID) | ORAL | Status: DC
  Administered 2014-07-15 – 2014-07-16 (×4): 300 mg
  Filled 2014-07-15 (×6): qty 6

## 2014-07-15 MED ORDER — DEXMEDETOMIDINE HCL IN NACL 400 MCG/100ML IV SOLN
0.4000 ug/kg/h | INTRAVENOUS | Status: DC
  Administered 2014-07-15 (×4): 0.7 ug/kg/h via INTRAVENOUS
  Administered 2014-07-15: 0.4 ug/kg/h via INTRAVENOUS
  Administered 2014-07-16 (×3): 0.7 ug/kg/h via INTRAVENOUS
  Filled 2014-07-15 (×2): qty 50
  Filled 2014-07-15: qty 100
  Filled 2014-07-15 (×3): qty 50
  Filled 2014-07-15 (×3): qty 100
  Filled 2014-07-15: qty 50

## 2014-07-15 MED ORDER — DEXTROSE 5 % IV SOLN
500.0000 mg | INTRAVENOUS | Status: DC
  Administered 2014-07-15 – 2014-07-16 (×2): 500 mg via INTRAVENOUS
  Filled 2014-07-15 (×3): qty 500

## 2014-07-15 MED ORDER — PROPOFOL 10 MG/ML IV EMUL
0.0000 ug/kg/min | INTRAVENOUS | Status: AC
  Administered 2014-07-15: 30 ug/kg/min via INTRAVENOUS

## 2014-07-15 MED ORDER — RISPERIDONE 1 MG/ML PO SOLN
1.0000 mg | Freq: Two times a day (BID) | ORAL | Status: DC
  Administered 2014-07-15 (×2): 1 mg via ORAL
  Filled 2014-07-15 (×4): qty 1

## 2014-07-15 MED ORDER — FUROSEMIDE 10 MG/ML IJ SOLN
60.0000 mg | Freq: Four times a day (QID) | INTRAMUSCULAR | Status: DC
  Administered 2014-07-15 – 2014-07-16 (×4): 60 mg via INTRAVENOUS
  Filled 2014-07-15 (×6): qty 6

## 2014-07-16 LAB — GLUCOSE, CAPILLARY
GLUCOSE-CAPILLARY: 274 mg/dL — AB (ref 70–99)
Glucose-Capillary: 176 mg/dL — ABNORMAL HIGH (ref 70–99)
Glucose-Capillary: 185 mg/dL — ABNORMAL HIGH (ref 70–99)
Glucose-Capillary: 195 mg/dL — ABNORMAL HIGH (ref 70–99)
Glucose-Capillary: 212 mg/dL — ABNORMAL HIGH (ref 70–99)
Glucose-Capillary: 260 mg/dL — ABNORMAL HIGH (ref 70–99)
Glucose-Capillary: 286 mg/dL — ABNORMAL HIGH (ref 70–99)

## 2014-07-16 LAB — CBC
HEMATOCRIT: 33.7 % — AB (ref 39.0–52.0)
Hemoglobin: 11 g/dL — ABNORMAL LOW (ref 13.0–17.0)
MCH: 29.8 pg (ref 26.0–34.0)
MCHC: 32.6 g/dL (ref 30.0–36.0)
MCV: 91.3 fL (ref 78.0–100.0)
Platelets: 167 10*3/uL (ref 150–400)
RBC: 3.69 MIL/uL — ABNORMAL LOW (ref 4.22–5.81)
RDW: 15.4 % (ref 11.5–15.5)
WBC: 6.6 10*3/uL (ref 4.0–10.5)

## 2014-07-16 LAB — BASIC METABOLIC PANEL
Anion gap: 16 — ABNORMAL HIGH (ref 5–15)
BUN: 57 mg/dL — AB (ref 6–23)
CO2: 31 mEq/L (ref 19–32)
CREATININE: 1.38 mg/dL — AB (ref 0.50–1.35)
Calcium: 8.8 mg/dL (ref 8.4–10.5)
Chloride: 102 mEq/L (ref 96–112)
GFR calc Af Amer: 59 mL/min — ABNORMAL LOW (ref >90)
GFR, EST NON AFRICAN AMERICAN: 51 mL/min — AB (ref >90)
Glucose, Bld: 189 mg/dL — ABNORMAL HIGH (ref 70–99)
POTASSIUM: 3.8 meq/L (ref 3.7–5.3)
Sodium: 149 mEq/L — ABNORMAL HIGH (ref 137–147)

## 2014-07-16 LAB — MAGNESIUM: Magnesium: 2 mg/dL (ref 1.5–2.5)

## 2014-07-16 LAB — PHOSPHORUS: PHOSPHORUS: 4.2 mg/dL (ref 2.3–4.6)

## 2014-07-16 MED ORDER — MORPHINE SULFATE 4 MG/ML IJ SOLN
4.0000 mg | INTRAMUSCULAR | Status: DC | PRN
  Administered 2014-07-17 (×2): 4 mg via INTRAVENOUS
  Filled 2014-07-16 (×2): qty 1

## 2014-07-16 MED ORDER — MORPHINE SULFATE 2 MG/ML IJ SOLN
2.0000 mg | INTRAMUSCULAR | Status: DC | PRN
  Administered 2014-07-16 (×2): 2 mg via INTRAVENOUS
  Filled 2014-07-16 (×2): qty 1

## 2014-07-16 MED ORDER — RISPERIDONE 0.5 MG PO TABS
0.5000 mg | ORAL_TABLET | Freq: Two times a day (BID) | ORAL | Status: DC
  Administered 2014-07-16: 0.5 mg via ORAL
  Filled 2014-07-16 (×3): qty 1

## 2014-07-16 MED ORDER — GABAPENTIN 300 MG PO CAPS
300.0000 mg | ORAL_CAPSULE | Freq: Three times a day (TID) | ORAL | Status: DC
  Administered 2014-07-16 – 2014-07-18 (×5): 300 mg via ORAL
  Filled 2014-07-16 (×8): qty 1

## 2014-07-16 MED ORDER — FUROSEMIDE 10 MG/ML IJ SOLN
60.0000 mg | Freq: Two times a day (BID) | INTRAMUSCULAR | Status: DC
  Administered 2014-07-16 – 2014-07-17 (×2): 60 mg via INTRAVENOUS
  Filled 2014-07-16 (×2): qty 6

## 2014-07-16 MED ORDER — ONDANSETRON HCL 4 MG/2ML IJ SOLN
4.0000 mg | Freq: Four times a day (QID) | INTRAMUSCULAR | Status: DC | PRN
  Administered 2014-07-16: 4 mg via INTRAVENOUS
  Filled 2014-07-16: qty 2

## 2014-07-16 MED ORDER — ONDANSETRON HCL 4 MG/2ML IJ SOLN
4.0000 mg | Freq: Three times a day (TID) | INTRAMUSCULAR | Status: DC | PRN
  Administered 2014-07-16: 4 mg via INTRAVENOUS
  Filled 2014-07-16: qty 2

## 2014-07-16 MED ORDER — RISPERIDONE 1 MG/ML PO SOLN
0.5000 mg | Freq: Two times a day (BID) | ORAL | Status: DC
  Filled 2014-07-16: qty 0.5

## 2014-07-16 MED ORDER — GABAPENTIN 300 MG PO CAPS
300.0000 mg | ORAL_CAPSULE | Freq: Three times a day (TID) | ORAL | Status: DC
  Filled 2014-07-16 (×2): qty 1

## 2014-07-16 MED ORDER — MORPHINE SULFATE 2 MG/ML IJ SOLN
INTRAMUSCULAR | Status: AC
  Filled 2014-07-16: qty 1

## 2014-07-16 MED ORDER — INSULIN GLARGINE 100 UNIT/ML ~~LOC~~ SOLN
10.0000 [IU] | Freq: Every day | SUBCUTANEOUS | Status: DC
  Administered 2014-07-16: 10 [IU] via SUBCUTANEOUS
  Filled 2014-07-16 (×2): qty 0.1

## 2014-07-16 NOTE — Progress Notes (Signed)
Patient complains of nausea.  Elink MD called and new orders received.

## 2014-07-16 NOTE — Progress Notes (Signed)
PULMONARY / CRITICAL CARE MEDICINE  Name: Travis [name] MRN: [id number] DOB: 1946-01-17    ADMISSION DATE:  07/10/2014 CONSULTATION DATE:  07/10/2014  REFERRING MD :  Sloan Leiter  CHIEF COMPLAINT:  Short of breath  BRIEF PATIENT DESCRIPTION: 68 yo former smoker admitted 7/13 with pneumonia and respiratory failure requiring intubation.  SIGNIFICANT STUDIES / EVENTS: 7/15  TTE >>> EF 65% 7/19  Extubated  LINES / TUBES: ETT 7/13 >>> 7/19 OETT 7/13 >>> 7/19 L IJ TLC 7/14 >>> R R AL ??? >>> 7/15  CULTURES: 7/13  Blood  >>> 7/14  Respiratory  >>> npof 7/13  Urine  >>> multiple morphotypes   ANTIBIOTICS: Rocephin 7/13 >>> Zithromax 7/13 >>>  INTERVAL HISTORY:  No acute events. Diuresing well. Improved mentation.  VITAL SIGNS: Temp:  [98.9 F (37.2 C)-101.2 F (38.4 C)] 98.9 F (37.2 C) (07/19 0700) Pulse Rate:  [56-89] 89 (07/19 0700) Resp:  [17-21] 18 (07/19 0700) BP: (83-168)/(20-111) 164/45 mmHg (07/19 0700) SpO2:  [91 %-100 %] 95 % (07/19 0700) FiO2 (%):  [40 %] 40 % (07/19 0835) Weight:  [126.1 kg (278 lb)] 126.1 kg (278 lb) (07/19 0346)  HEMODYNAMICS: CVP:  [8 mmHg-13 mmHg] 10 mmHg  VENTILATOR SETTINGS: Vent Mode:  [-] PSV FiO2 (%):  [40 %] 40 % Set Rate:  [18 bmp] 18 bmp Vt Set:  [640 mL] 640 mL PEEP:  [5 cmH20] 5 cmH20 Pressure Support:  [10 cmH20] 10 cmH20 Plateau Pressure:  [16 cmH20-25 cmH20] 16 cmH20  INTAKE / OUTPUT: Intake/Output     07/18 0701 - 07/19 0700 07/19 0701 - 07/20 0700   I.V. (mL/kg) 1739.8 (13.8) 63.1 (0.5)   NG/GT 2055 75   IV Piggyback 300    Total Intake(mL/kg) 4094.8 (32.5) 138.1 (1.1)   Urine (mL/kg/hr) 8125 (2.7)    Stool     Total Output 8125     Net -4030.2 +138.1         PHYSICAL EXAMINATION: General:  Tolerating SBT well Neuro:  Awake, alert, following commands HEENT:  PERRL, OETT / OGT Cardiovascular:  Regular, no murmurs Lungs:  Bilateral air entry, few rales Abdomen:  Soft, nontender, bowel sounds  diminished Musculoskeletal:  BL LE edema Skin:  Intact  LABS:  CBC  Recent Labs Lab 07/14/14 0335 07/15/14 0405 07/16/14 0400  WBC 5.0 5.1 6.6  HGB 10.5* 10.0* 11.0*  HCT 31.6* 30.8* 33.7*  PLT 116* 96* 167   Coag's No results found for this basename: APTT, INR,  in the last 168 hours  BMET  Recent Labs Lab 07/14/14 0335 07/15/14 0405 07/16/14 0400  NA 144 144 149*  K 3.5* 4.5 3.8  CL 105 104 102  CO2 23 25 31   BUN 49* 50* 57*  CREATININE 1.38* 1.47* 1.38*  GLUCOSE 201* 174* 189*   Electrolytes  Recent Labs Lab 07/14/14 0335 07/15/14 0405 07/16/14 0400  CALCIUM 8.3* 8.1* 8.8  MG 2.5 2.2 2.0  PHOS 3.4 3.4 4.2   Sepsis Markers  Recent Labs Lab 07/10/14 1314 07/10/14 2105 07/12/14 0615 07/14/14 0335  LATICACIDVEN 2.13  --   --   --   PROCALCITON  --  2.90 11.04 3.08   ABG  Recent Labs Lab 07/13/14 0350 07/14/14 0335 07/15/14 0500  PHART 7.409 7.448 7.431  PCO2ART 38.0 34.1* 38.9  PO2ART 60.6* 157.0* 67.0*   Liver Enzymes  Recent Labs Lab 07/10/14 1255  AST 24  ALT 29  ALKPHOS 70  BILITOT 0.7  ALBUMIN 4.3  Cardiac Enzymes  Recent Labs Lab 07/10/14 1255 07/10/14 2105  TROPONINI  --  0.33*  PROBNP 142.2* 1750.0*   Glucose  Recent Labs Lab 07/15/14 1152 07/15/14 1545 07/15/14 1957 07/15/14 2335 07/16/14 0341 07/16/14 0749  GLUCAP 232* 292* 203* 286* 176* 260*   IMAGING:   Dg Chest Port 1 View  07/15/2014   CLINICAL DATA:  Intubated patient  EXAM: PORTABLE CHEST - 1 VIEW  COMPARISON:  07/14/2014  FINDINGS: Lung base opacity has slightly improved from the previous day's study, more notable on the right, consistent with improved atelectasis. No new lung opacities. No evidence of edema.  Cardiac silhouette is normal in size.  No pneumothorax.  Endotracheal tube, nasogastric tube and left internal jugular central venous line are stable and well positioned.  IMPRESSION: Slight improvement in lung aeration from the previous  day's study. No other change.  Support apparatus is stable and well positioned.   Electronically Signed   By: Lajean Manes M.D.   On: 07/15/2014 07:09   ASSESSMENT / PLAN:  PULMONARY A: Acute hypoxemic respiratory failure, resolved 7/19 Pulmonary edema, improving Possible pneumonia ( CAP ) OSA Atelectasis / low lung volumes P:   Extubate  Goal SpO2>92 Supplemental oxygen Incentive spirometry / flutter valve Trend CXR Albuterol PRN  CARDIOVASCULAR A:  Probable acute on chronic diastolic heart failure Hypotension, likely sedation-related, resolved P:  Goal MAP>65 Hold Cardura / Lopressor D/c Levophed gtt  RENAL A:   AKI Much improved fluid balance BPH P:   Trend BMP Decrease  Lasix 60 bid Zaroxolyn 5 mg PO daily  GASTROINTESTINAL A:   Nutrition Ileus GI Px is not indicated P:   NPO Bedside swallow evaluation / diet? D/c Protonix  HEMATOLOGIC A:   Anemia, improving Thrombocytopenia, improving VTE Px P:  Trend CBC Lovenox  INFECTIOUS A:   CAP. P:   Abx / cx as above x 7 days total  ENDOCRINE A:   DM Hyperglycemia Hypothyroidism  P:   SSI Add Lantus 10 Levothyroxine   NEUROLOGIC A:   ICU pain / sedation Restless leg syndrome Peripheral neuropathy Cervical myelopathy P:   D/c  Precedex / Fentanyl / Versed Decrease Risperdal  0.5 q12h Neurontin to 300 mg q8h ( half dose ) Mirapex PT / OT  I have personally obtained history, examined patient, evaluated and interpreted laboratory and imaging results, reviewed medical records, formulated assessment / plan and placed orders.  CRITICAL CARE:  The patient is critically ill with multiple organ systems failure and requires high complexity decision making for assessment and support, frequent evaluation and titration of therapies, application of advanced monitoring technologies and extensive interpretation of multiple databases. Critical Care Time devoted to patient care services described in  this note is 35 minutes.   Doree Fudge, MD Pulmonary and [place] Pager: [phone]  07/16/2014, 10:03 AM

## 2014-07-16 NOTE — Progress Notes (Signed)
Called Dr. Earnest Conroy regarding pts complaints of pain and subsequent shortness of breath.  New orders received for Morphine prn.  RT came in and administered an albuteral treatment.  After morphine 2 mg and the albuteral treatment patient reports that his shortness of breath is improving. Will cont to monitor.

## 2014-07-16 NOTE — Progress Notes (Signed)
[facility] Progress Note Patient Name: [name] DOB: [birth year] MRN: [id number]  Date of Service  07/16/2014   HPI/Events of Note    Nausea. MS intact per RN.  eICU Interventions   PRN Zofran.   Intervention Category Minor Interventions: OtherLowry Ram, Minervia Osso R. 07/16/2014, 4:05 PM

## 2014-07-17 ENCOUNTER — Encounter (HOSPITAL_COMMUNITY): Payer: Self-pay | Admitting: Physician Assistant

## 2014-07-17 DIAGNOSIS — I10 Essential (primary) hypertension: Secondary | ICD-10-CM

## 2014-07-17 DIAGNOSIS — A419 Sepsis, unspecified organism: Secondary | ICD-10-CM

## 2014-07-17 DIAGNOSIS — I509 Heart failure, unspecified: Secondary | ICD-10-CM

## 2014-07-17 DIAGNOSIS — R5381 Other malaise: Secondary | ICD-10-CM

## 2014-07-17 DIAGNOSIS — G8220 Paraplegia, unspecified: Secondary | ICD-10-CM

## 2014-07-17 DIAGNOSIS — R6521 Severe sepsis with septic shock: Secondary | ICD-10-CM

## 2014-07-17 LAB — CULTURE, BLOOD (ROUTINE X 2)
Culture: NO GROWTH
Culture: NO GROWTH

## 2014-07-17 LAB — GLUCOSE, CAPILLARY
GLUCOSE-CAPILLARY: 188 mg/dL — AB (ref 70–99)
GLUCOSE-CAPILLARY: 203 mg/dL — AB (ref 70–99)
GLUCOSE-CAPILLARY: 183 mg/dL — AB (ref 70–99)
GLUCOSE-CAPILLARY: 255 mg/dL — AB (ref 70–99)
Glucose-Capillary: 169 mg/dL — ABNORMAL HIGH (ref 70–99)

## 2014-07-17 LAB — CBC
HCT: 34.3 % — ABNORMAL LOW (ref 39.0–52.0)
HEMOGLOBIN: 11 g/dL — AB (ref 13.0–17.0)
MCH: 29.8 pg (ref 26.0–34.0)
MCHC: 32.1 g/dL (ref 30.0–36.0)
MCV: 93 fL (ref 78.0–100.0)
Platelets: 229 10*3/uL (ref 150–400)
RBC: 3.69 MIL/uL — AB (ref 4.22–5.81)
RDW: 15.2 % (ref 11.5–15.5)
WBC: 6.4 10*3/uL (ref 4.0–10.5)

## 2014-07-17 LAB — BASIC METABOLIC PANEL
Anion gap: 13 (ref 5–15)
BUN: 56 mg/dL — ABNORMAL HIGH (ref 6–23)
CO2: 35 meq/L — AB (ref 19–32)
Calcium: 8.7 mg/dL (ref 8.4–10.5)
Chloride: 99 mEq/L (ref 96–112)
Creatinine, Ser: 1.2 mg/dL (ref 0.50–1.35)
GFR calc Af Amer: 69 mL/min — ABNORMAL LOW (ref >90)
GFR calc non Af Amer: 60 mL/min — ABNORMAL LOW (ref >90)
Glucose, Bld: 175 mg/dL — ABNORMAL HIGH (ref 70–99)
POTASSIUM: 3.6 meq/L — AB (ref 3.7–5.3)
SODIUM: 147 meq/L (ref 137–147)

## 2014-07-17 LAB — MAGNESIUM: Magnesium: 2.1 mg/dL (ref 1.5–2.5)

## 2014-07-17 MED ORDER — POTASSIUM CHLORIDE CRYS ER 20 MEQ PO TBCR
40.0000 meq | EXTENDED_RELEASE_TABLET | Freq: Once | ORAL | Status: AC
  Administered 2014-07-17: 40 meq via ORAL
  Filled 2014-07-17: qty 2

## 2014-07-17 MED ORDER — METOPROLOL TARTRATE 12.5 MG HALF TABLET
12.5000 mg | ORAL_TABLET | Freq: Two times a day (BID) | ORAL | Status: DC
  Administered 2014-07-17 (×2): 12.5 mg via ORAL
  Filled 2014-07-17 (×4): qty 1

## 2014-07-17 MED ORDER — INSULIN ASPART 100 UNIT/ML ~~LOC~~ SOLN
0.0000 [IU] | Freq: Three times a day (TID) | SUBCUTANEOUS | Status: DC
  Administered 2014-07-17: 11 [IU] via SUBCUTANEOUS
  Administered 2014-07-18: 4 [IU] via SUBCUTANEOUS

## 2014-07-17 MED ORDER — FUROSEMIDE 10 MG/ML IJ SOLN
60.0000 mg | Freq: Every day | INTRAMUSCULAR | Status: DC
  Filled 2014-07-17: qty 6

## 2014-07-17 MED ORDER — INSULIN GLARGINE 100 UNIT/ML ~~LOC~~ SOLN
15.0000 [IU] | Freq: Every day | SUBCUTANEOUS | Status: DC
Start: 2014-07-17 — End: 2014-07-18
  Administered 2014-07-17: 15 [IU] via SUBCUTANEOUS
  Filled 2014-07-17 (×2): qty 0.15

## 2014-07-17 MED ORDER — OXYCODONE HCL ER 10 MG PO T12A
10.0000 mg | EXTENDED_RELEASE_TABLET | Freq: Two times a day (BID) | ORAL | Status: DC
  Administered 2014-07-17 – 2014-07-18 (×3): 10 mg via ORAL
  Filled 2014-07-17 (×3): qty 1

## 2014-07-17 MED ORDER — METAXALONE 400 MG HALF TABLET
400.0000 mg | ORAL_TABLET | Freq: Three times a day (TID) | ORAL | Status: DC
  Administered 2014-07-17 – 2014-07-18 (×4): 400 mg via ORAL
  Filled 2014-07-17 (×9): qty 1

## 2014-07-17 MED ORDER — RISPERIDONE 1 MG PO TABS
1.0000 mg | ORAL_TABLET | Freq: Every day | ORAL | Status: DC
  Filled 2014-07-17: qty 1

## 2014-07-17 NOTE — Evaluation (Signed)
Occupational Therapy Evaluation Patient Details Name: Travis [name] MRN: [id number] DOB: 1946/10/16 Today's Date: 07/17/2014    History of Present Illness Pt is a 68 y/o male admitted with CAP and respiratory failure requiring intubation. Pt extubated on 07/16/14.    Clinical Impression   PT admitted with CAP and respiratory failure.. Pt currently with functional limitiations due to the deficits listed below (see OT problem list). PTA pt was independent.  Pt will benefit from skilled OT to increase their independence and safety with adls and balance to allow discharge CIR. OT to address adl retraining with AE education, energy conservation and dynamic balance.         CIR    Equipment Recommendations  Other (comment) (defer to CIR)    Recommendations for Other Services Rehab consult     Precautions / Restrictions Precautions Precautions: Fall Restrictions Weight Bearing Restrictions: No      Mobility Bed Mobility Overal bed mobility: +2 for physical assistance;Needs Assistance Bed Mobility: Rolling;Sidelying to Sit Rolling: Mod assist;+2 for physical assistance Sidelying to sit: Max assist;+2 for physical assistance       General bed mobility comments: VC's for sequencing and technique. Instructed in log roll as pt complaining of back pain/spasms. Ongoing back pain issues at baseline. Max +2 for trunk elevation to full sitting position.   Transfers Overall transfer level: Needs assistance Equipment used:  [name]) Transfers: Stand Pivot Transfers   Stand pivot transfers: Total assist;+2 safety/equipment;+2 physical assistance       General transfer comment: Used Sara Plus lift to transition from bed to recliner. Pt required support of the lift as well as under hips with bed pad to power-up into standing and maintain extended hips/knees. Tactile cueing for back extension as pt resting chest and chin on front tray of Sara Plus at times. Pt required extensive input  to extend hips with max cueing.    Balance Overall balance assessment: Needs assistance Sitting-balance support: Bilateral upper extremity supported;Feet supported Sitting balance-Leahy Scale: Zero Sitting balance - Comments: OT providing posterior support while pt sitting EOB. Max assist required.  Postural control: Posterior lean   Standing balance-Leahy Scale: Zero                              ADL Overall ADL's : Needs assistance/impaired     Grooming: Oral care;Minimal assistance;Sitting Grooming Details (indicate cue type and reason): extended time and rest breaks to complete task Upper Body Bathing: Moderate assistance   Lower Body Bathing: Total assistance;Sit to/from stand                         General ADL Comments: Pt c/o back pain and hx of back pain. pt and wife educated on log roll to maintain good back health. pt reports "that was good. You girls are good" Pt required sara plus to complete sit<>Stand with total +2 max (A) to achieve upright posture in machine. Pt transfered to chair this session. RN staff educated on the need to maxi move hoyer for return to bed in 2 hrs. Pt and wife educated on 2 hours in chair max for skin intregity at this time.      Vision                     Perception     Praxis      Pertinent Vitals/Pain decr Oxygen on RA  Hand Dominance Right   Extremity/Trunk Assessment Upper Extremity Assessment Upper Extremity Assessment: Generalized weakness   Lower Extremity Assessment Lower Extremity Assessment: Generalized weakness   Cervical / Trunk Assessment Cervical / Trunk Assessment: Normal;Other exceptions Cervical / Trunk Exceptions: Weak extensors, kept head in flexion most of session.   Communication Communication Communication: HOH   Cognition Arousal/Alertness: Awake/alert Behavior During Therapy: Flat affect Overall Cognitive Status: Within Functional Limits for tasks assessed                      General Comments       Exercises       Shoulder Instructions      Home Living Family/patient expects to be discharged to:: Private residence Living Arrangements: Spouse/significant other Available Help at Discharge: Family;Available 24 hours/day Type of Home: House Home Access: Stairs to enter [employer] of Steps: 2 Entrance Stairs-Rails: None Home Layout: One level               Home Equipment: Cane - single point;Walker - 2 wheels (walker is 68 years old)          Prior Functioning/Environment Level of Independence: Needs assistance  Gait / Transfers Assistance Needed: Walked with the cane all the time  ADL's / Homemaking Assistance Needed: Independent with bathing/dressing. Could stand in the shower to wash. Wife occasionally helps with socks and shoes        OT Diagnosis: Generalized weakness;Acute pain   OT Problem List: Decreased strength;Decreased range of motion;Decreased activity tolerance;Impaired balance (sitting and/or standing);Decreased safety awareness;Decreased knowledge of use of DME or AE;Decreased knowledge of precautions;Cardiopulmonary status limiting activity;Obesity;Impaired UE functional use;Pain   OT Treatment/Interventions: Self-care/ADL training;Therapeutic exercise;DME and/or AE instruction;Energy conservation;Therapeutic activities;Patient/family education;Balance training    OT Goals(Current goals can be found in the care plan section) Acute Rehab OT Goals Patient Stated Goal: Spend time out of the bed OT Goal Formulation: With patient/family Time For Goal Achievement: 07/31/14 Potential to Achieve Goals: Good  OT Frequency: Min 2X/week   Barriers to D/C:            Co-evaluation PT/OT/SLP Co-Evaluation/Treatment: Yes Reason for Co-Treatment: Complexity of the patient's impairments (multi-system involvement)   OT goals addressed during session: ADL's and self-care      End of Session Equipment  Utilized During Treatment: Gait belt;Other (comment) (sara plus) Nurse Communication: Mobility status;Need for lift equipment;Precautions  Activity Tolerance: Patient limited by fatigue Patient left: in chair;with call bell/phone within reach;with family/visitor present   Time: [id number] OT Time Calculation (min): 42 min Charges:  OT General Charges $OT Visit: 1 Procedure OT Evaluation $Initial OT Evaluation Tier I: 1 Procedure OT Treatments $Self Care/Home Management : 8-22 mins G-Codes:    Peri Maris 07-25-2014, 3:25 PM Pager: [phone]

## 2014-07-17 NOTE — Progress Notes (Signed)
PULMONARY / CRITICAL CARE MEDICINE  Name: Travis [name] MRN: [id number] DOB: March 07, 1946    ADMISSION DATE:  07/10/2014 CONSULTATION DATE:  07/10/2014  REFERRING MD :  Sloan Leiter  CHIEF COMPLAINT:  Short of breath  BRIEF PATIENT DESCRIPTION: 68 yo former smoker admitted 7/13 with pneumonia and respiratory failure requiring intubation.  SIGNIFICANT STUDIES / EVENTS: 7/15  TTE >>> EF 65% 7/19  Extubated  LINES / TUBES: ETT 7/13 >>> 7/19 OETT 7/13 >>> 7/19 L IJ TLC 7/14 >>> R R AL ??? >>> 7/15  CULTURES: 7/13  Blood  >>> 7/14  Respiratory  >>> npof 7/13  Urine  >>> multiple morphotypes   ANTIBIOTICS: Rocephin 7/13 >>> 7/20 Zithromax 7/13 >>> 7/20  INTERVAL HISTORY:  Remains extubated.  Complaints of significant back pain.  VITAL SIGNS: Temp:  [98.4 F (36.9 C)-100 F (37.8 C)] 98.4 F (36.9 C) (07/20 0720) Pulse Rate:  [68-84] 84 (07/19 1600) Resp:  [20-30] 20 (07/20 0720) BP: (123-173)/(33-83) 164/47 mmHg (07/20 0720) SpO2:  [92 %-98 %] 94 % (07/20 0720) Weight:  [121.9 kg (268 lb 11.9 oz)] 121.9 kg (268 lb 11.9 oz) (07/20 0500)  HEMODYNAMICS: CVP:  [0 mmHg-14 mmHg] 8 mmHg  VENTILATOR SETTINGS:   INTAKE / OUTPUT: Intake/Output     07/19 0701 - 07/20 0700 07/20 0701 - 07/21 0700   P.O. 70    I.V. (mL/kg) 356 (2.9) 30 (0.2)   NG/GT 75    IV Piggyback 300    Total Intake(mL/kg) 801 (6.6) 30 (0.2)   Urine (mL/kg/hr) 4100 (1.4)    Total Output 4100     Net -3299 +30         PHYSICAL EXAMINATION: General:  No distress Neuro:  Awake, alert, interactive HEENT:  PERRL Cardiovascular:  Regular, no murmurs Lungs:  Bilateral air entry, no added sounds Abdomen:  Soft, nontender, bowel sounds diminished Musculoskeletal:  BL LE edema, improved Skin:  Intact  LABS:  CBC  Recent Labs Lab 07/15/14 0405 07/16/14 0400 07/17/14 0441  WBC 5.1 6.6 6.4  HGB 10.0* 11.0* 11.0*  HCT 30.8* 33.7* 34.3*  PLT 96* 167 229   Coag's No results found for this basename:  APTT, INR,  in the last 168 hours  BMET  Recent Labs Lab 07/15/14 0405 07/16/14 0400 07/17/14 0441  NA 144 149* 147  K 4.5 3.8 3.6*  CL 104 102 99  CO2 25 31 35*  BUN 50* 57* 56*  CREATININE 1.47* 1.38* 1.20  GLUCOSE 174* 189* 175*   Electrolytes  Recent Labs Lab 07/14/14 0335 07/15/14 0405 07/16/14 0400 07/17/14 0441  CALCIUM 8.3* 8.1* 8.8 8.7  MG 2.5 2.2 2.0 2.1  PHOS 3.4 3.4 4.2  --    Sepsis Markers  Recent Labs Lab 07/10/14 1314 07/10/14 2105 07/12/14 0615 07/14/14 0335  LATICACIDVEN 2.13  --   --   --   PROCALCITON  --  2.90 11.04 3.08   ABG  Recent Labs Lab 07/13/14 0350 07/14/14 0335 07/15/14 0500  PHART 7.409 7.448 7.431  PCO2ART 38.0 34.1* 38.9  PO2ART 60.6* 157.0* 67.0*   Liver Enzymes  Recent Labs Lab 07/10/14 1255  AST 24  ALT 29  ALKPHOS 70  BILITOT 0.7  ALBUMIN 4.3   Cardiac Enzymes  Recent Labs Lab 07/10/14 1255 07/10/14 2105  TROPONINI  --  0.33*  PROBNP 142.2* 1750.0*   Glucose  Recent Labs Lab 07/16/14 1152 07/16/14 1637 07/16/14 1931 07/16/14 2335 07/17/14 0427 07/17/14 0725  GLUCAP 274* 185* 212*  195* 169* 188*   IMAGING:   No results found.  ASSESSMENT / PLAN:  PULMONARY A: Acute hypoxemic respiratory failure, resolved 7/19 Pulmonary edema, improving Possible pneumonia ( CAP ) OSA Atelectasis / low lung volumes P:   Goal SpO2>92 Supplemental oxygen Incentive spirometry / flutter valve Albuterol PRN  CARDIOVASCULAR A:  Probable acute on chronic diastolic heart failure Hypotension, likely sedation-related, resolved P:  Goal MAP>65 Restart Metoprolol 12.5 bid Hold Cardura Get Cardiology consult to establish outpatient follow up  RENAL A:   AKI, resolved Improved fluid balance BPH Hypokalemia  P:   Trend BMP Decrease  Lasix 60 daily D/c Zaroxolyn K 40  GASTROINTESTINAL A:   Nutrition Ileus, improved GI Px is not indicated P:   Cardiac diet  HEMATOLOGIC A:    Anemia Thrombocytopenia, improved VTE Px P:  Trend CBC Lovenox  INFECTIOUS A:   CAP. P:   Abx completed  ENDOCRINE A:   DM Hyperglycemia Hypothyroidism  P:   SSI Increase  Lantus 15 Levothyroxine   NEUROLOGIC A:   ICU pain / sedation Restless leg syndrome Peripheral neuropathy Cervical myelopathy Acute on chronic back pain P:   Start OxyContin 10 bid Start Skelaxin 400 tid Morphine PRN Cahnge Risperdal  1 qhs Neurontin to 300 mg q8h ( half dose ) Mirapex PT / OT  I have personally obtained history, examined patient, evaluated and interpreted laboratory and imaging results, reviewed medical records, formulated assessment / plan and placed orders.  Doree Fudge, MD Pulmonary [facility] [place] Pager: [phone]  07/17/2014, 10:25 AM

## 2014-07-17 NOTE — Consult Note (Signed)
Cardiology Consultation Note  Patient ID: Travis [name], MRN: [id number], DOB/AGE: 04-Dec-1946 68 y.o. Admit date: 07/10/2014   Date of Consult: 07/17/2014 Primary Physician: Simona Huh, MD Primary Cardiologist: New  Chief Complaint: SOB, fever Reason for Consult: diastolic CHF  HPI: 68 yo male with PMH significant for HTN, hypothyroidism, OSA, DM2 poorly controlled with diet, former smoker (quit 29 yrs ago with 40 pack year history), was admitted for treatment of sepsis secondary to either CAP (community acquired pneumonia) or UTI.  Pt was treated with IV antibiotics to cover both sources of infection.  During his hospitalization he developed progressive respiratory failure and required intubation. Pt developed oliguric AKI/ATN in the setting of PNA, hypoxic RF, and septic shock. His respiratory failure has improved but he is still requiring 4L/[place]. He finished antibiotics 07/16/14. Due to LEE and suspicion for pulmonary edema, he was also diuresed for acute diastolic CHF with IV Lasix and metolazone. Weight of unclear reliability - 285->290->286->290->278->268 today, - 1.6L today. He remains on 60mg  IV Lasix daily. Cardiology was consulted for risk stratification and possibility of acute on chronic diastolic heart failure. 2D echocardiogram, although poor quality for some parts of visualization, showed an EF of 60-65%.  Pt states that he has had chronic lower extremity edema for many years which has improved today. He denies prior hx of SOB (before this admission), dyspnea on exertion, chest pain, orthopnea, PND. Eats many foods with salt in them. Pt has no personal hx of CAD. On admit, troponin x 1 was 0.33, pBNP 1750. He currently feels much better and his wife notes she can finally see his ankles which she hasn't seen in years.  Past Medical History  Diagnosis Date  . PONV (postoperative nausea and vomiting)   . Hypertension   . DJD (degenerative joint disease)     WHOLE SPINE  .  Hyperthyroidism     Had Iodine to resolve issue DX 10 YR AGO  . Hypothyroidism     Low d/t treatment of hyperthyroidism  . Sleep apnea     DX  2009/2010 [facility]  . Type II diabetes mellitus     diet controlled  . Heart murmur     Evaluated by cardiologist 4-5 years ago but does not currently see a cardiologist  . Gait disorder   . Cervical myelopathy     with myelomalacia at C5-6  . RLS (restless legs syndrome)   . Peripheral neuropathy   . 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 Constipation - functional     controlled with miralax      Most Recent Cardiac Studies: 2D Echocardiogram w/out Contrast 07/12/14  Study Conclusions  Transthoracic echocardiography. M-mode, limited 2D, limited spectral Doppler, and color Doppler.   Left ventricle: The cavity size was normal. Wall thickness was normal. Systolic function was normal. The estimated ejection fraction was in the range of 60% to 65%.  Aortic valve: Poorly visualized. Doppler: There was no stenosis. There was no regurgitation.  Aorta: Aortic root: The aortic root was normal in size. Ascending aorta: The ascending aorta was normal in size.  Mitral valve: Poorly visualized. Doppler: There was no significant regurgitation.  Left atrium: The atrium was normal in size.  Right ventricle: Poorly visualized. The cavity size was normal.  Pericardium: There was no pericardial effusion.   Surgical History:  Past Surgical History  Procedure Laterality Date  . Cholecystectomy    . Back surgery      6 surgeries  .  Lumbar laminectomy/decompression microdiscectomy  07/14/2012    Procedure: LUMBAR LAMINECTOMY/DECOMPRESSION MICRODISCECTOMY 2 LEVELS;  Surgeon: Eustace Moore, MD;  Location: [facility];  Service: Neurosurgery;  Laterality: Left;  Lumbar two three laminectomy, left thoracic four five transpedicular diskectomy     Home Meds: Prior to Admission medications   Medication Sig Start Date End Date Taking?  Authorizing Provider  ciclopirox (PENLAC) 8 % solution Apply 1 application topically at bedtime as needed (dry legs).  07/05/14  Yes Historical Provider, MD  diclofenac (VOLTAREN) 75 MG EC tablet Take 75 mg by mouth daily after lunch. About 4pm   Yes Historical Provider, MD  diphenhydrAMINE (BENADRYL) 25 MG tablet Take 25 mg by mouth at bedtime as needed for sleep.   Yes Historical Provider, MD  doxazosin (CARDURA) 4 MG tablet Take 4 mg by mouth at bedtime.   Yes Historical Provider, MD  gabapentin (NEURONTIN) 600 MG tablet Take 600 mg by mouth 3 (three) times daily.   Yes Historical Provider, MD  ibuprofen (ADVIL,MOTRIN) 200 MG tablet Take 400 mg by mouth at bedtime as needed for moderate pain.   Yes Historical Provider, MD  levothyroxine (SYNTHROID, LEVOTHROID) 150 MCG tablet Take 150 mcg by mouth daily.   Yes Historical Provider, MD  lisinopril-hydrochlorothiazide (PRINZIDE,ZESTORETIC) 20-12.5 MG per tablet Take 1 tablet by mouth every evening.    Yes Historical Provider, MD  metoprolol succinate (TOPROL-XL) 25 MG 24 hr tablet Take 25 mg by mouth daily.    Yes Historical Provider, MD  oxybutynin (DITROPAN) 5 MG tablet Take 5 mg by mouth daily as needed for bladder spasms.   Yes Historical Provider, MD  pramipexole (MIRAPEX) 0.125 MG tablet Take 0.25 mg by mouth 2 (two) times daily.    Yes Historical Provider, MD    Inpatient Medications:  . antiseptic oral rinse  15 mL Mouth Rinse QID  . chlorhexidine  15 mL Mouth Rinse BID  . enoxaparin (LOVENOX) injection  40 mg Subcutaneous Q24H  . [START ON 07/18/2014] furosemide  60 mg Intravenous Daily  . gabapentin  300 mg Oral 3 times per day  . insulin aspart  0-20 Units Subcutaneous 6 times per day  . insulin glargine  15 Units Subcutaneous QHS  . levothyroxine  150 mcg Per Tube QAC breakfast  . metaxalone  400 mg Oral TID  . metoprolol tartrate  12.5 mg Oral BID  . OxyCODONE  10 mg Oral Q12H  . pramipexole  0.25 mg Per Tube BID  . [START ON  07/18/2014] risperiDONE  1 mg Oral QHS  . sodium chloride  3 mL Intravenous Q12H   . sodium chloride 10 mL/hr at 07/17/14 0800    Allergies:  Allergies  Allergen Reactions  . Other Other (See Comments)    Anesthesia makes sick, must have antinausea medication in advance.    History   Social History  . Marital Status: Married    Spouse Name: [name]    Number of Children: 3  . Years of Education: 16   Occupational History  .      Works in [place]  . Smoking status: Former Smoker -- 2.00 packs/day for 20 years    Types: Cigarettes    Quit date: 06/28/1985  . Smokeless tobacco: Former Systems developer    Quit date: 06/28/1985  . Alcohol Use: No  . Drug Use: No  . Sexual Activity: Not on file   Other Topics Concern  . Not on file  Social History Narrative  . No narrative on file     Family History  Problem Relation Age of Onset  . Polycythemia Mother   . Diabetes Father   . Hypertension Father   . Heart disease Father      Review of Systems: General: see above Cardiovascular: see above Dermatological: negative for rash Respiratory + cough Urologic: negative for hematuria Abdominal: negative for nausea, vomiting, diarrhea, bright red blood per rectum, melena, or hematemesis Neurologic: negative for visual changes, syncope, or dizziness All other systems reviewed and are otherwise negative except as noted above.  Labs: Troponin 0.33. Lab Results  Component Value Date   WBC 6.4 07/17/2014   HGB 11.0* 07/17/2014   HCT 34.3* 07/17/2014   MCV 93.0 07/17/2014   PLT 229 07/17/2014    Recent Labs Lab 07/10/14 1255  07/17/14 0441  NA 142  < > 147  K 4.1  < > 3.6*  CL 103  < > 99  CO2 24  < > 35*  BUN 31*  < > 56*  CREATININE 1.12  < > 1.20  CALCIUM 9.3  < > 8.7  PROT 7.4  --   --   BILITOT 0.7  --   --   ALKPHOS 70  --   --   ALT 29  --   --   AST 24  --   --   GLUCOSE 139*  < > 175*  < > = values in this interval not  displayed. Lab Results  Component Value Date   TRIG 123 07/15/2014   Radiology/Studies:  Dg Chest Port 1 View 07/15/2014   CLINICAL DATA:  Intubated patient  EXAM: PORTABLE CHEST - 1 VIEW  COMPARISON:  07/14/2014  FINDINGS: Lung base opacity has slightly improved from the previous day's study, more notable on the right, consistent with improved atelectasis. No new lung opacities. No evidence of edema.  Cardiac silhouette is normal in size.  No pneumothorax.  Endotracheal tube, nasogastric tube and left internal jugular central venous line are stable and well positioned.  IMPRESSION: Slight improvement in lung aeration from the previous day's study. No other change.  Support apparatus is stable and well positioned.   Electronically Signed   By: Lajean Manes M.D.   On: 07/15/2014 07:09    Dg Chest Port 1 View 07/10/2014   CLINICAL DATA:  Fever  EXAM: PORTABLE CHEST - 1 VIEW  COMPARISON:  07/14/2012  FINDINGS: Low lung volumes are present, causing crowding of the pulmonary vasculature. Lower thoracic posterolateral rod and pedicle screw fixation noted.  Increased airspace opacity, indistinctly marginated, left lung base.  Atherosclerotic aortic arch.  IMPRESSION: 1. Indistinct airspace opacity at the left lung base, potentially from developing bronchopneumonia or underlying atelectasis. I favor the former. 2. Low lung volumes are present, causing crowding of the pulmonary vasculature.   Electronically Signed   By: Sherryl Barters M.D.   On: 07/10/2014 12:54   EKG: Normal sinus rhythm with RBBB Admit: NSR RBBB nonspecific ST-T changes  Physical Exam: Blood pressure 164/47, pulse 84, temperature 98.4 F (36.9 C), temperature source Oral, resp. rate 20, height 6\' 1"  (1.854 m), weight 268 lb 11.9 oz (121.9 kg), SpO2 94.00%. General: Well developed, well nourished, sitting in chair, appears tired and chronically ill. Head: Normocephalic, atraumatic, sclera non-icteric, no xanthomas, nares are without  discharge.  Neck: Negative for carotid bruits. JVD not elevated. Lungs: Clear bilaterally to auscultation without wheezes, rales, or rhonchi. Pt too weak to sit  forward. Breathing is unlabored. Heart: RRR with S1 S2. Very faint systolic murmur. No rubs, or gallop appreciated. Abdomen: distended, non-tender with normoactive bowel sounds. No hepatomegaly. No rebound/guarding. No obvious abdominal masses. Msk:  Strength and tone appear decreased for age. Extremities: No clubbing or cyanosis. Chronic stasis changes. Tr edema.  Distal pedal pulses are 2+ and equal bilaterally. Neuro: Alert and oriented X 3. No facial asymmetry. No focal deficit. Moves all extremities spontaneously. Psych:  Responds to questions appropriately with a flat affect.   Assessment and Plan:  1. Acute respiratory failure, requiring ventilation, improving but on 4L [place] 2. CAP with septic shock 3. Acute (?possibly on chronic) diastolic CHF 4. HTN 5. Former tobacco abuse 6. Diabetes mellitus 7. Mildly elevated troponin but no anginal symptoms - suspect due to #1/#2 8. AKI due to ATN peak Cr 3 range, resolved  Continue IV Lasix for now to see if we can diurese further to lessen O2 requirement, consider transition to oral soon. Pt advised on salt/fluid restriction. Would aim for good BP control. Primary team repleting lytes. See below for additional thoughts.  Signed, Anson Crofts PA-S   Signed, Melina Copa [name] 07/17/2014, 10:54 AM  Attending Note:   The patient was seen and examined.  Agree with assessment and plan as noted above.  Changes made to the above note as needed.  Pt was admitted with respiratory failure.  He eats a very salty diet ( regularly eats hot dogs, ham, bacon, etc. )   He has been diuresed and is feeling much better.    Exam is as above.  Echo was technically very difficult but does show probable normal LV systolic function.  The quality of the echo is not sufficient to evaluate diastolic  dysfunction.  He does not appear to have any significant valvular disease.   Would transition to PO lasix soon.  He will probably go home on some lasix but he may  Not need it long term if he is able to eat a better diet free of excess salt.   [name], [name], MD, [facility] 07/17/2014, 1:21 PM

## 2014-07-17 NOTE — Evaluation (Signed)
Physical Therapy Evaluation Patient Details Name: Travis [name] MRN: [id number] DOB: 1946-08-15 Today's Date: 07/17/2014   History of Present Illness  Pt is a 68 y/o male admitted with CAP and respiratory failure requiring intubation. Pt extubated on 07/16/14.   Clinical Impression  Pt admitted with the above. Pt currently with functional limitations due to the deficits listed below (see PT Problem List). At the time of PT eval pt was able to achieve OOB with use of Sara Plus lift, max assist for balance, and +2 assist for mobility. Pt will benefit from skilled PT to increase their independence and safety with mobility to allow discharge to the venue listed below.     Follow Up Recommendations CIR;Supervision/Assistance - 24 hour    Equipment Recommendations  Rolling walker with 5" wheels;3in1 (PT);Wheelchair (measurements PT);Wheelchair cushion (measurements PT)    Recommendations for Other Services Rehab consult     Precautions / Restrictions Precautions Precautions: Fall Restrictions Weight Bearing Restrictions: No      Mobility  Bed Mobility Overal bed mobility: +2 for physical assistance;Needs Assistance Bed Mobility: Rolling;Sidelying to Sit Rolling: Mod assist;+2 for physical assistance Sidelying to sit: Max assist;+2 for physical assistance       General bed mobility comments: VC's for sequencing and technique. Instructed in log roll as pt complaining of back pain/spasms. Ongoing back pain issues at baseline. Max +2 for trunk elevation to full sitting position.   Transfers Overall transfer level: Needs assistance Equipment used:  [name]) Transfers: Stand Pivot Transfers   Stand pivot transfers: Total assist;+2 safety/equipment;+2 physical assistance       General transfer comment: Used Sara Plus lift to transition from bed to recliner. Pt required support of the lift as well as under hips with bed pad to power-up into standing and maintain extended  hips/knees. Tactile cueing for back extension as pt resting chest and chin on front tray of Sara Plus at times.   Ambulation/Gait             General Gait Details: Unable at this time  Stairs            Wheelchair Mobility    Modified Rankin (Stroke Patients Only)       Balance Overall balance assessment: Needs assistance Sitting-balance support: Feet supported;Bilateral upper extremity supported Sitting balance-Leahy Scale: Zero Sitting balance - Comments: OT providing posterior support while pt sitting EOB. Max assist required.  Postural control: Posterior lean                                   Pertinent Vitals/Pain Pt reports pain in his lower back that is present at baseline.     Home Living Family/patient expects to be discharged to:: Private residence Living Arrangements: Spouse/significant other Available Help at Discharge: Family;Available 24 hours/day Type of Home: House Home Access: Stairs to enter Entrance Stairs-Rails: None Entrance Stairs-Number of Steps: 2 Home Layout: One level Home Equipment: Cane - single point;Walker - 2 wheels (walker is 68 years old)      Prior Function Level of Independence: Needs assistance   Gait / Transfers Assistance Needed: Walked with the cane all the time   ADL's / Homemaking Assistance Needed: Independent with bathing/dressing. Could stand in the shower to wash. Wife occasionally helps with socks and shoes        Hand Dominance   Dominant Hand: Right    Extremity/Trunk Assessment   Upper Extremity  Assessment: Defer to OT evaluation           Lower Extremity Assessment: Generalized weakness      Cervical / Trunk Assessment: Normal;Other exceptions  Communication   Communication: HOH  Cognition Arousal/Alertness: Lethargic Behavior During Therapy: Flat affect Overall Cognitive Status: Within Functional Limits for tasks assessed                      General Comments       Exercises        Assessment/Plan    PT Assessment Patient needs continued PT services  PT Diagnosis Difficulty walking;Generalized weakness   PT Problem List Decreased strength;Decreased range of motion;Decreased activity tolerance;Decreased balance;Decreased mobility;Decreased knowledge of use of DME;Decreased safety awareness;Decreased knowledge of precautions;Cardiopulmonary status limiting activity;Pain  PT Treatment Interventions DME instruction;Gait training;Stair training;Functional mobility training;Therapeutic activities;Therapeutic exercise;Neuromuscular re-education;Patient/family education   PT Goals (Current goals can be found in the Care Plan section) Acute Rehab PT Goals Patient Stated Goal: Spend time out of the bed PT Goal Formulation: With patient/family Time For Goal Achievement: 07/31/14 Potential to Achieve Goals: Good    Frequency Min 3X/week   Barriers to discharge        Co-evaluation PT/OT/SLP Co-Evaluation/Treatment: Yes Reason for Co-Treatment: Complexity of the patient's impairments (multi-system involvement);For patient/therapist safety PT goals addressed during session: Mobility/safety with mobility;Balance;Strengthening/ROM         End of Session Equipment Utilized During Treatment: Gait belt;Oxygen;Other (comment) [name]) Activity Tolerance: Patient limited by fatigue Patient left: in chair;with call bell/phone within reach;with family/visitor present Nurse Communication: Mobility status;Need for lift equipment         Time: 0942-1030 PT Time Calculation (min): 48 min   Charges:   PT Evaluation $Initial PT Evaluation Tier I: 1 Procedure PT Treatments $Therapeutic Activity: 23-37 mins   PT G Codes:          Jolyn Lent 07/17/2014, 11:43 AM  Jolyn Lent, PT, DPT Acute Rehabilitation Services Pager: [phone]

## 2014-07-17 NOTE — Consult Note (Signed)
Physical Medicine and Rehabilitation Consult  Reason for Consult: Deconditioning Referring Physician: Dr. Oliver Pila   HPI: Travis [name] is a 68 y.o. 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  CXR with evidence of pneumonia and patient with progressive respiratory failure requiring intubation past admission. He was started on IV antibiotics for sepsis as well as IVF bolus for hypotension. Hospital course complicated by AKI due to ATN, agitation as well as intermittent fevers. He completed antibiotics on 07/19 and tolerated extubation. Renal status slowly improving with increase in UOP.  Has had SOB that has improved with diuresis. 2D echo with normal LVF. Cardiology consulted for input on CHF and recommended continuing IV diuresis with question of acute on chronic CHF.  PT/OT evaluations done today and CIR recommended due to deconditioned state.   Reviewed neurology note from March of 2015. At that time his upper extremity strength is rated as normal and lower extremity as 4/5. He did have wide based gait. He did use a cane. Suggestions for for bilateral AFOs. Review of Systems  HENT: Negative for hearing loss.   Eyes: Negative for blurred vision and double vision.  Respiratory: Positive for shortness of breath (improving). Negative for cough and wheezing.   Cardiovascular: Negative for chest pain and palpitations.  Gastrointestinal: Positive for constipation. Negative for heartburn and nausea.  Genitourinary: Positive for urgency and frequency.  Musculoskeletal: Positive for back pain, myalgias and neck pain.       Impaired balance.   Neurological: Positive for sensory change (BLE) and weakness. Negative for headaches.  Psychiatric/Behavioral: The patient is nervous/anxious.     Past Medical History  Diagnosis Date  . PONV (postoperative nausea and  vomiting)   . Hypertension   . DJD (degenerative joint disease)     WHOLE SPINE  . Hyperthyroidism     Had Iodine to resolve issue DX 10 YR AGO  . Hypothyroidism     Low d/t treatment of hyperthyroidism  . Sleep apnea     DX  2009/2010 study done at [facility]  . Type II diabetes mellitus     diet controlled  . Heart murmur     Evaluated by cardiologist 4-5 years ago but does not currently see a cardiologist  . Gait disorder   . Cervical myelopathy     with myelomalacia at C5-6  . RLS (restless legs syndrome)   . Peripheral neuropathy   . 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 Constipation - functional     controlled with miralax  . RBBB     Past Surgical History  Procedure Laterality Date  . Cholecystectomy    . Back surgery      6 surgeries  . Lumbar laminectomy/decompression microdiscectomy  07/14/2012    Procedure: LUMBAR LAMINECTOMY/DECOMPRESSION MICRODISCECTOMY 2 LEVELS;  Surgeon: Eustace Moore, MD;  Location: [facility];  Service: Neurosurgery;  Laterality: Left;  Lumbar two three laminectomy, left thoracic four five transpedicular diskectomy    Family History  Problem Relation Age of Onset  . Polycythemia Mother   . Diabetes Father   . Hypertension Father   . Heart disease Father   . CAD      1 of his 4 brothers and father has CAD    Social History:  Married. Independent PTA--uses walker in the home and cane out of home setting. 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 He  reports that he quit smoking about 29 years ago. His smoking use included Cigarettes. He has a 40 pack-year smoking history. He quit smokeless tobacco use about 29 years ago. He reports that he does not drink alcohol or use illicit drugs.   Allergies  Allergen Reactions  . Other Other (See Comments)    Anesthesia makes sick, must have antinausea medication in advance.   Medications Prior to Admission  Medication Sig Dispense Refill  . ciclopirox (PENLAC) 8 % solution Apply 1 application topically  at bedtime as needed (dry legs).       . diclofenac (VOLTAREN) 75 MG EC tablet Take 75 mg by mouth daily after lunch. About 4pm      . diphenhydrAMINE (BENADRYL) 25 MG tablet Take 25 mg by mouth at bedtime as needed for sleep.      [name] doxazosin (CARDURA) 4 MG tablet Take 4 mg by mouth at bedtime.      . gabapentin (NEURONTIN) 600 MG tablet Take 600 mg by mouth 3 (three) times daily.      [name] ibuprofen (ADVIL,MOTRIN) 200 MG tablet Take 400 mg by mouth at bedtime as needed for moderate pain.      [name] levothyroxine (SYNTHROID, LEVOTHROID) 150 MCG tablet Take 150 mcg by mouth daily.      [name] lisinopril-hydrochlorothiazide (PRINZIDE,ZESTORETIC) 20-12.5 MG per tablet Take 1 tablet by mouth every evening.       . metoprolol succinate (TOPROL-XL) 25 MG 24 hr tablet Take 25 mg by mouth daily.       [name] oxybutynin (DITROPAN) 5 MG tablet Take 5 mg by mouth daily as needed for bladder spasms.      . pramipexole (MIRAPEX) 0.125 MG tablet Take 0.25 mg by mouth 2 (two) times daily.         Home: Home Living Family/patient expects to be discharged to:: Private residence Living Arrangements: Spouse/significant other Available Help at Discharge: Family;Available 24 hours/day Type of Home: House Home Access: Stairs to enter [employer] of Steps: 2 Entrance Stairs-Rails: None Home Layout: One level Home Equipment: Cane - single point;Walker - 2 wheels (walker is 68 years old)  Functional History: Prior Function Level of Independence: Needs assistance Gait / Transfers Assistance Needed: Walked with the cane all the time  ADL's / Homemaking Assistance Needed: Independent with bathing/dressing. Could stand in the shower to wash. Wife occasionally helps with socks and shoes Functional Status:  Mobility: Bed Mobility Overal bed mobility: +2 for physical assistance;Needs Assistance Bed Mobility: Rolling;Sidelying to Sit Rolling: Mod assist;+2 for physical assistance Sidelying to sit: Max assist;+2 for  physical assistance General bed mobility comments: VC's for sequencing and technique. Instructed in log roll as pt complaining of back pain/spasms. Ongoing back pain issues at baseline. Max +2 for trunk elevation to full sitting position.  Transfers Overall transfer level: Needs assistance Equipment used:  [name]) Transfers: Stand Pivot Transfers Stand pivot transfers: Total assist;+2 safety/equipment;+2 physical assistance General transfer comment: Used Sara Plus lift to transition from bed to recliner. Pt required support of the lift as well as under hips with bed pad to power-up into standing and maintain extended hips/knees. Tactile cueing for back extension as pt resting chest and chin on front tray of Sara Plus at times.  Ambulation/Gait General Gait Details: Unable at this time    ADL:    Cognition: Cognition Overall Cognitive Status: Within Functional Limits for tasks assessed Orientation Level: Oriented X4 Cognition Arousal/Alertness: Lethargic Behavior  During Therapy: Flat affect Overall Cognitive Status: Within Functional Limits for tasks assessed  Blood pressure 144/52, pulse 84, temperature 98.1 F (36.7 C), temperature source Oral, resp. rate 20, height 6\' 1"  (1.854 m), weight 121.9 kg (268 lb 11.9 oz), SpO2 94.00%. Physical Exam  Nursing note and vitals reviewed. Constitutional: He is oriented to person, place, and time. He appears well-developed and well-nourished.  HENT:  Head: Normocephalic and atraumatic.  Eyes: Conjunctivae are normal. Pupils are equal, round, and reactive to light.  Neck: Normal range of motion. Neck supple.  Cardiovascular: Normal rate and regular rhythm.   Respiratory: Effort normal and breath sounds normal. No respiratory distress. He has no wheezes.  GI: Soft. Bowel sounds are normal. He exhibits no distension. There is no tenderness.  Musculoskeletal: He exhibits no edema.  Neurological: He is alert and oriented to person, place, and  time.  Speech slow. Tremors BUE.  Able to follow basic commands without difficulty. Dysesthesias bilateral feet.   Skin: Skin is warm and dry.   extremity: Intrinsic atrophy bilateral hands. Motor strength is 4 minus bilateral grip 5 bilateral biceps and deltoid 4/5 left triceps 5/5 right tricep 2 minus hip flexion knee extension ankle dorsiflexion and plantarflexion Sensation intact to light touch in bilateral upper and lower extremities. Deep tendon reflexes 2+ bilateral knees trace bilateral biceps and brachioradialis and triceps Ankles without clonus  Results for orders placed during the hospital encounter of 07/10/14 (from the past 24 hour(s))  GLUCOSE, CAPILLARY     Status: Abnormal   Collection Time    07/16/14  4:37 PM      Result Value Ref Range   Glucose-Capillary 185 (*) 70 - 99 mg/dL  GLUCOSE, CAPILLARY     Status: Abnormal   Collection Time    07/16/14  7:31 PM      Result Value Ref Range   Glucose-Capillary 212 (*) 70 - 99 mg/dL  GLUCOSE, CAPILLARY     Status: Abnormal   Collection Time    07/16/14 11:35 PM      Result Value Ref Range   Glucose-Capillary 195 (*) 70 - 99 mg/dL  GLUCOSE, CAPILLARY     Status: Abnormal   Collection Time    07/17/14  4:27 AM      Result Value Ref Range   Glucose-Capillary 169 (*) 70 - 99 mg/dL  CBC     Status: Abnormal   Collection Time    07/17/14  4:41 AM      Result Value Ref Range   WBC 6.4  4.0 - 10.5 K/uL   RBC 3.69 (*) 4.22 - 5.81 MIL/uL   Hemoglobin 11.0 (*) 13.0 - 17.0 g/dL   HCT 34.3 (*) 39.0 - 52.0 %   MCV 93.0  78.0 - 100.0 fL   MCH 29.8  26.0 - 34.0 pg   MCHC 32.1  30.0 - 36.0 g/dL   RDW 15.2  11.5 - 15.5 %   Platelets 229  150 - 400 K/uL  BASIC METABOLIC PANEL     Status: Abnormal   Collection Time    07/17/14  4:41 AM      Result Value Ref Range   Sodium 147  137 - 147 mEq/L   Potassium 3.6 (*) 3.7 - 5.3 mEq/L   Chloride 99  96 - 112 mEq/L   CO2 35 (*) 19 - 32 mEq/L   Glucose, Bld 175 (*) 70 - 99 mg/dL    BUN 56 (*) 6 - 23 mg/dL  Creatinine, Ser 1.20  0.50 - 1.35 mg/dL   Calcium 8.7  8.4 - 10.5 mg/dL   GFR calc non Af Amer 60 (*) >90 mL/min   GFR calc Af Amer 69 (*) >90 mL/min   Anion gap 13  5 - 15  MAGNESIUM     Status: None   Collection Time    07/17/14  4:41 AM      Result Value Ref Range   Magnesium 2.1  1.5 - 2.5 mg/dL  GLUCOSE, CAPILLARY     Status: Abnormal   Collection Time    07/17/14  7:25 AM      Result Value Ref Range   Glucose-Capillary 188 (*) 70 - 99 mg/dL  GLUCOSE, CAPILLARY     Status: Abnormal   Collection Time    07/17/14 11:09 AM      Result Value Ref Range   Glucose-Capillary 203 (*) 70 - 99 mg/dL   No results found.  Assessment/Plan: Diagnosis: Cervical myelopathy with deconditioning after pneumonia requiring intubation 1. Does the need for close, 24 hr/day medical supervision in concert with the patient's rehab needs make it unreasonable for this patient to be served in a less intensive setting? Yes 2. Co-Morbidities requiring supervision/potential complications: Hypertension, obstructive sleep apnea, neurogenic bowel and bladder 3. Due to bladder management, bowel management, safety, skin/wound care, disease management, medication administration, pain management and patient education, does the patient require 24 hr/day rehab nursing? Yes 4. Does the patient require coordinated care of a physician, rehab nurse, PT (1-2 hrs/day, 5 days/week) and OT (1-2 hrs/day, 5 days/week) to address physical and functional deficits in the context of the above medical diagnosis(es)? Yes Addressing deficits in the following areas: balance, endurance, locomotion, strength, transferring, bowel/bladder control, bathing, dressing, feeding, grooming and toileting 5. Can the patient actively participate in an intensive therapy program of at least 3 hrs of therapy per day at least 5 days per week? Yes 6. The potential for patient to make measurable gains while on inpatient rehab is  good 7. Anticipated functional outcomes upon discharge from inpatient rehab are min assist  with PT, min assist with OT, n/a with SLP. 8. Estimated rehab length of stay to reach the above functional goals is: 13-18 days 9. Does the patient have adequate social supports to accommodate these discharge functional goals? Yes 10. Anticipated D/C setting: Home 11. Anticipated post D/C treatments: [place] therapy 12. Overall Rehab/Functional Prognosis: good  RECOMMENDATIONS: This patient's condition is appropriate for continued rehabilitative care in the following setting: CIR Patient has agreed to participate in recommended program. Potentially Note that insurance prior authorization may be required for reimbursement for recommended care.  Comment: Would recommend thoracic and lumbar MRI to evaluate lower extremity weakness. This was recommended by neurology in March but the patient did not follow through on this    07/17/2014

## 2014-07-17 NOTE — Progress Notes (Signed)
Rehab Admissions Coordinator Note:  Patient was screened by Retta Diones for appropriateness for an Inpatient Acute Rehab Consult.  At this time, we are recommending Inpatient Rehab consult.  Jodell Cipro M 07/17/2014, 12:03 PM  I can be reached at [phone].

## 2014-07-18 ENCOUNTER — Inpatient Hospital Stay (HOSPITAL_COMMUNITY)
Admission: RE | Admit: 2014-07-18 | Discharge: 2014-08-09 | DRG: 945 | Disposition: A | Discharge status: Home Health | Payer: Medicare Other | Source: Ambulatory Visit | Attending: Physical Medicine & Rehabilitation | Admitting: Physical Medicine & Rehabilitation

## 2014-07-18 DIAGNOSIS — R5381 Other malaise: Secondary | ICD-10-CM

## 2014-07-18 DIAGNOSIS — Z9119 Patient's noncompliance with other medical treatment and regimen: Secondary | ICD-10-CM | POA: Diagnosis not present

## 2014-07-18 DIAGNOSIS — A419 Sepsis, unspecified organism: Secondary | ICD-10-CM

## 2014-07-18 DIAGNOSIS — Z833 Family history of diabetes mellitus: Secondary | ICD-10-CM

## 2014-07-18 DIAGNOSIS — I959 Hypotension, unspecified: Secondary | ICD-10-CM

## 2014-07-18 DIAGNOSIS — M4807 Spinal stenosis, lumbosacral region: Secondary | ICD-10-CM

## 2014-07-18 DIAGNOSIS — Z5189 Encounter for other specified aftercare: Principal | ICD-10-CM

## 2014-07-18 DIAGNOSIS — N400 Enlarged prostate without lower urinary tract symptoms: Secondary | ICD-10-CM | POA: Diagnosis not present

## 2014-07-18 DIAGNOSIS — I5033 Acute on chronic diastolic (congestive) heart failure: Secondary | ICD-10-CM

## 2014-07-18 DIAGNOSIS — N179 Acute kidney failure, unspecified: Secondary | ICD-10-CM | POA: Diagnosis not present

## 2014-07-18 DIAGNOSIS — G4733 Obstructive sleep apnea (adult) (pediatric): Secondary | ICD-10-CM

## 2014-07-18 DIAGNOSIS — E1142 Type 2 diabetes mellitus with diabetic polyneuropathy: Secondary | ICD-10-CM | POA: Diagnosis not present

## 2014-07-18 DIAGNOSIS — Z91199 Patient's noncompliance with other medical treatment and regimen due to unspecified reason: Secondary | ICD-10-CM | POA: Diagnosis not present

## 2014-07-18 DIAGNOSIS — G589 Mononeuropathy, unspecified: Secondary | ICD-10-CM | POA: Diagnosis not present

## 2014-07-18 DIAGNOSIS — L8990 Pressure ulcer of unspecified site, unspecified stage: Secondary | ICD-10-CM

## 2014-07-18 DIAGNOSIS — R269 Unspecified abnormalities of gait and mobility: Secondary | ICD-10-CM

## 2014-07-18 DIAGNOSIS — Z87891 Personal history of nicotine dependence: Secondary | ICD-10-CM

## 2014-07-18 DIAGNOSIS — Z8249 Family history of ischemic heart disease and other diseases of the circulatory system: Secondary | ICD-10-CM | POA: Diagnosis not present

## 2014-07-18 DIAGNOSIS — I509 Heart failure, unspecified: Secondary | ICD-10-CM | POA: Diagnosis not present

## 2014-07-18 DIAGNOSIS — R600 Localized edema: Secondary | ICD-10-CM

## 2014-07-18 DIAGNOSIS — M48061 Spinal stenosis, lumbar region without neurogenic claudication: Secondary | ICD-10-CM | POA: Diagnosis not present

## 2014-07-18 DIAGNOSIS — M4714 Other spondylosis with myelopathy, thoracic region: Secondary | ICD-10-CM

## 2014-07-18 DIAGNOSIS — Z79899 Other long term (current) drug therapy: Secondary | ICD-10-CM | POA: Diagnosis not present

## 2014-07-18 DIAGNOSIS — I5031 Acute diastolic (congestive) heart failure: Secondary | ICD-10-CM | POA: Diagnosis present

## 2014-07-18 DIAGNOSIS — G4700 Insomnia, unspecified: Secondary | ICD-10-CM | POA: Diagnosis not present

## 2014-07-18 DIAGNOSIS — I10 Essential (primary) hypertension: Secondary | ICD-10-CM | POA: Diagnosis not present

## 2014-07-18 DIAGNOSIS — J189 Pneumonia, unspecified organism: Secondary | ICD-10-CM | POA: Diagnosis not present

## 2014-07-18 DIAGNOSIS — G63 Polyneuropathy in diseases classified elsewhere: Secondary | ICD-10-CM | POA: Diagnosis present

## 2014-07-18 DIAGNOSIS — G8929 Other chronic pain: Secondary | ICD-10-CM | POA: Diagnosis not present

## 2014-07-18 DIAGNOSIS — L8992 Pressure ulcer of unspecified site, stage 2: Secondary | ICD-10-CM | POA: Diagnosis not present

## 2014-07-18 DIAGNOSIS — E1149 Type 2 diabetes mellitus with other diabetic neurological complication: Secondary | ICD-10-CM | POA: Diagnosis not present

## 2014-07-18 DIAGNOSIS — K5900 Constipation, unspecified: Secondary | ICD-10-CM | POA: Diagnosis not present

## 2014-07-18 DIAGNOSIS — M4712 Other spondylosis with myelopathy, cervical region: Secondary | ICD-10-CM

## 2014-07-18 DIAGNOSIS — G2581 Restless legs syndrome: Secondary | ICD-10-CM | POA: Diagnosis not present

## 2014-07-18 DIAGNOSIS — Z9989 Dependence on other enabling machines and devices: Secondary | ICD-10-CM

## 2014-07-18 DIAGNOSIS — K5904 Chronic idiopathic constipation: Secondary | ICD-10-CM

## 2014-07-18 LAB — GLUCOSE, CAPILLARY
GLUCOSE-CAPILLARY: 206 mg/dL — AB (ref 70–99)
Glucose-Capillary: 198 mg/dL — ABNORMAL HIGH (ref 70–99)
Glucose-Capillary: 211 mg/dL — ABNORMAL HIGH (ref 70–99)
Glucose-Capillary: 213 mg/dL — ABNORMAL HIGH (ref 70–99)

## 2014-07-18 LAB — CBC
HCT: 37.6 % — ABNORMAL LOW (ref 39.0–52.0)
HEMOGLOBIN: 11.8 g/dL — AB (ref 13.0–17.0)
MCH: 30.3 pg (ref 26.0–34.0)
MCHC: 31.4 g/dL (ref 30.0–36.0)
MCV: 96.4 fL (ref 78.0–100.0)
PLATELETS: 288 10*3/uL (ref 150–400)
RBC: 3.9 MIL/uL — AB (ref 4.22–5.81)
RDW: 15.3 % (ref 11.5–15.5)
WBC: 6.5 10*3/uL (ref 4.0–10.5)

## 2014-07-18 LAB — BASIC METABOLIC PANEL
ANION GAP: 12 (ref 5–15)
BUN: 58 mg/dL — ABNORMAL HIGH (ref 6–23)
CO2: 35 meq/L — AB (ref 19–32)
Calcium: 9.2 mg/dL (ref 8.4–10.5)
Chloride: 99 mEq/L (ref 96–112)
Creatinine, Ser: 1.03 mg/dL (ref 0.50–1.35)
GFR calc Af Amer: 84 mL/min — ABNORMAL LOW (ref >90)
GFR calc non Af Amer: 72 mL/min — ABNORMAL LOW (ref >90)
GLUCOSE: 216 mg/dL — AB (ref 70–99)
POTASSIUM: 3.8 meq/L (ref 3.7–5.3)
Sodium: 146 mEq/L (ref 137–147)

## 2014-07-18 LAB — TRIGLYCERIDES: TRIGLYCERIDES: 145 mg/dL (ref <150)

## 2014-07-18 LAB — MAGNESIUM: MAGNESIUM: 2.7 mg/dL — AB (ref 1.5–2.5)

## 2014-07-18 MED ORDER — INSULIN GLARGINE 100 UNIT/ML ~~LOC~~ SOLN
20.0000 [IU] | Freq: Every day | SUBCUTANEOUS | Status: DC
  Administered 2014-07-18: 20 [IU] via SUBCUTANEOUS
  Filled 2014-07-18 (×2): qty 0.2

## 2014-07-18 MED ORDER — POLYETHYLENE GLYCOL 3350 17 G PO PACK
17.0000 g | PACK | Freq: Every day | ORAL | Status: DC
  Administered 2014-07-18 – 2014-08-07 (×17): 17 g via ORAL
  Filled 2014-07-18 (×25): qty 1

## 2014-07-18 MED ORDER — PROCHLORPERAZINE MALEATE 5 MG PO TABS
5.0000 mg | ORAL_TABLET | Freq: Four times a day (QID) | ORAL | Status: DC | PRN
  Administered 2014-07-19: 10 mg via ORAL
  Filled 2014-07-18: qty 2

## 2014-07-18 MED ORDER — LEVOTHYROXINE SODIUM 150 MCG PO TABS
150.0000 ug | ORAL_TABLET | Freq: Every day | ORAL | Status: DC
  Administered 2014-07-19 – 2014-08-09 (×22): 150 ug via ORAL
  Filled 2014-07-18 (×25): qty 1

## 2014-07-18 MED ORDER — INSULIN GLARGINE 100 UNIT/ML ~~LOC~~ SOLN
20.0000 [IU] | Freq: Every day | SUBCUTANEOUS | Status: DC
  Filled 2014-07-18: qty 0.2

## 2014-07-18 MED ORDER — ALPRAZOLAM 0.5 MG PO TABS
0.5000 mg | ORAL_TABLET | Freq: Three times a day (TID) | ORAL | Status: DC | PRN
  Administered 2014-07-26 – 2014-08-08 (×15): 0.5 mg via ORAL
  Filled 2014-07-18 (×15): qty 1

## 2014-07-18 MED ORDER — FLEET ENEMA 7-19 GM/118ML RE ENEM: 1.0000 | ENEMA | Freq: Once | RECTAL | Status: DC | PRN

## 2014-07-18 MED ORDER — INSULIN ASPART 100 UNIT/ML ~~LOC~~ SOLN
0.0000 [IU] | Freq: Every day | SUBCUTANEOUS | Status: DC
Start: 2014-07-18 — End: 2014-08-09
  Administered 2014-07-18 – 2014-07-22 (×3): 2 [IU] via SUBCUTANEOUS

## 2014-07-18 MED ORDER — ALUM & MAG HYDROXIDE-SIMETH 200-200-20 MG/5ML PO SUSP: 30.0000 mL | ORAL | Status: DC | PRN

## 2014-07-18 MED ORDER — LIDOCAINE HCL 2 % EX GEL
CUTANEOUS | Status: DC | PRN
  Filled 2014-07-18: qty 5

## 2014-07-18 MED ORDER — BISACODYL 10 MG RE SUPP
10.0000 mg | Freq: Every day | RECTAL | Status: DC | PRN
  Administered 2014-07-20: 10 mg via RECTAL
  Filled 2014-07-18 (×2): qty 1

## 2014-07-18 MED ORDER — GUAIFENESIN-DM 100-10 MG/5ML PO SYRP
5.0000 mL | ORAL_SOLUTION | Freq: Four times a day (QID) | ORAL | Status: DC | PRN
Start: 2014-07-18 — End: 2014-08-09

## 2014-07-18 MED ORDER — GLUCERNA SHAKE PO LIQD: 237.0000 mL | Freq: Three times a day (TID) | ORAL | Status: DC

## 2014-07-18 MED ORDER — ZOLPIDEM TARTRATE 5 MG PO TABS
5.0000 mg | ORAL_TABLET | Freq: Every evening | ORAL | Status: DC | PRN
  Administered 2014-07-19 – 2014-08-01 (×3): 5 mg via ORAL
  Filled 2014-07-18 (×4): qty 1

## 2014-07-18 MED ORDER — GLUCERNA SHAKE PO LIQD
237.0000 mL | Freq: Three times a day (TID) | ORAL | Status: DC
  Administered 2014-07-19 – 2014-08-09 (×10): 237 mL via ORAL

## 2014-07-18 MED ORDER — ALPRAZOLAM 0.5 MG PO TABS
0.5000 mg | ORAL_TABLET | Freq: Three times a day (TID) | ORAL | Status: DC | PRN
  Administered 2014-07-18: 0.5 mg via ORAL
  Filled 2014-07-18: qty 1

## 2014-07-18 MED ORDER — ALBUTEROL SULFATE (2.5 MG/3ML) 0.083% IN NEBU: 2.5000 mg | INHALATION_SOLUTION | RESPIRATORY_TRACT | Status: DC | PRN

## 2014-07-18 MED ORDER — ACETAMINOPHEN 325 MG PO TABS
325.0000 mg | ORAL_TABLET | ORAL | Status: DC | PRN
  Administered 2014-07-31 – 2014-08-04 (×5): 650 mg via ORAL
  Filled 2014-07-18 (×6): qty 2

## 2014-07-18 MED ORDER — ENOXAPARIN SODIUM 40 MG/0.4ML ~~LOC~~ SOLN
40.0000 mg | SUBCUTANEOUS | Status: DC
  Administered 2014-07-18 – 2014-08-08 (×22): 40 mg via SUBCUTANEOUS
  Filled 2014-07-18 (×24): qty 0.4

## 2014-07-18 MED ORDER — GLIMEPIRIDE 2 MG PO TABS
2.0000 mg | ORAL_TABLET | Freq: Every day | ORAL | Status: DC
  Administered 2014-07-18 – 2014-07-24 (×6): 2 mg via ORAL
  Filled 2014-07-18 (×7): qty 1

## 2014-07-18 MED ORDER — PROCHLORPERAZINE 25 MG RE SUPP
12.5000 mg | Freq: Four times a day (QID) | RECTAL | Status: DC | PRN
  Filled 2014-07-18: qty 1

## 2014-07-18 MED ORDER — FUROSEMIDE 40 MG PO TABS
40.0000 mg | ORAL_TABLET | Freq: Two times a day (BID) | ORAL | Status: DC
  Administered 2014-07-19 – 2014-08-04 (×33): 40 mg via ORAL
  Filled 2014-07-18 (×35): qty 1

## 2014-07-18 MED ORDER — METOPROLOL TARTRATE 25 MG PO TABS
25.0000 mg | ORAL_TABLET | Freq: Two times a day (BID) | ORAL | Status: DC
  Administered 2014-07-18: 25 mg via ORAL
  Filled 2014-07-18: qty 1

## 2014-07-18 MED ORDER — OXYCODONE HCL ER 20 MG PO T12A: 20.0000 mg | EXTENDED_RELEASE_TABLET | Freq: Two times a day (BID) | ORAL | Status: DC

## 2014-07-18 MED ORDER — INSULIN ASPART 100 UNIT/ML ~~LOC~~ SOLN: 0.0000 [IU] | Freq: Every day | SUBCUTANEOUS | Status: DC

## 2014-07-18 MED ORDER — INSULIN ASPART 100 UNIT/ML ~~LOC~~ SOLN
0.0000 [IU] | Freq: Three times a day (TID) | SUBCUTANEOUS | Status: DC
  Administered 2014-07-18 (×2): 5 [IU] via SUBCUTANEOUS

## 2014-07-18 MED ORDER — METAXALONE 400 MG HALF TABLET
400.0000 mg | ORAL_TABLET | Freq: Three times a day (TID) | ORAL | Status: DC
  Administered 2014-07-18 – 2014-07-20 (×5): 400 mg via ORAL
  Filled 2014-07-18 (×9): qty 1

## 2014-07-18 MED ORDER — FLEET ENEMA 7-19 GM/118ML RE ENEM
1.0000 | ENEMA | Freq: Once | RECTAL | Status: AC | PRN
  Administered 2014-07-19: 1 via RECTAL
  Filled 2014-07-18: qty 1

## 2014-07-18 MED ORDER — METOPROLOL TARTRATE 25 MG PO TABS
25.0000 mg | ORAL_TABLET | Freq: Two times a day (BID) | ORAL | Status: DC
  Administered 2014-07-18 – 2014-08-09 (×42): 25 mg via ORAL
  Filled 2014-07-18 (×46): qty 1

## 2014-07-18 MED ORDER — PROCHLORPERAZINE EDISYLATE 5 MG/ML IJ SOLN
5.0000 mg | Freq: Four times a day (QID) | INTRAMUSCULAR | Status: DC | PRN
  Administered 2014-07-19: 10 mg via INTRAMUSCULAR
  Filled 2014-07-18: qty 2

## 2014-07-18 MED ORDER — OXYCODONE HCL ER 10 MG PO T12A
10.0000 mg | EXTENDED_RELEASE_TABLET | Freq: Two times a day (BID) | ORAL | Status: DC
  Administered 2014-07-18 – 2014-08-01 (×27): 10 mg via ORAL
  Filled 2014-07-18 (×29): qty 1

## 2014-07-18 MED ORDER — PRAMIPEXOLE DIHYDROCHLORIDE 0.25 MG PO TABS
0.2500 mg | ORAL_TABLET | Freq: Two times a day (BID) | ORAL | Status: DC
  Administered 2014-07-18 – 2014-07-21 (×7): 0.25 mg via ORAL
  Filled 2014-07-18 (×10): qty 1

## 2014-07-18 MED ORDER — GABAPENTIN 300 MG PO CAPS
300.0000 mg | ORAL_CAPSULE | Freq: Three times a day (TID) | ORAL | Status: DC
  Administered 2014-07-18 – 2014-07-20 (×5): 300 mg via ORAL
  Filled 2014-07-18 (×8): qty 1

## 2014-07-18 MED ORDER — FLEET ENEMA 7-19 GM/118ML RE ENEM
1.0000 | ENEMA | Freq: Once | RECTAL | Status: AC
  Administered 2014-07-18: 1 via RECTAL
  Filled 2014-07-18: qty 1

## 2014-07-18 MED ORDER — INSULIN ASPART 100 UNIT/ML ~~LOC~~ SOLN
0.0000 [IU] | Freq: Three times a day (TID) | SUBCUTANEOUS | Status: DC
  Administered 2014-07-19 (×2): 3 [IU] via SUBCUTANEOUS
  Administered 2014-07-19: 2 [IU] via SUBCUTANEOUS
  Administered 2014-07-20: 5 [IU] via SUBCUTANEOUS
  Administered 2014-07-20 – 2014-07-21 (×2): 2 [IU] via SUBCUTANEOUS
  Administered 2014-07-21: 3 [IU] via SUBCUTANEOUS
  Administered 2014-07-22: 2 [IU] via SUBCUTANEOUS
  Administered 2014-07-22 – 2014-07-23 (×3): 3 [IU] via SUBCUTANEOUS
  Administered 2014-07-23: 5 [IU] via SUBCUTANEOUS
  Administered 2014-07-24: 3 [IU] via SUBCUTANEOUS
  Administered 2014-07-24 – 2014-07-26 (×3): 2 [IU] via SUBCUTANEOUS
  Administered 2014-07-26: 3 [IU] via SUBCUTANEOUS
  Administered 2014-07-26 – 2014-07-29 (×7): 2 [IU] via SUBCUTANEOUS
  Administered 2014-07-29 – 2014-07-30 (×3): 3 [IU] via SUBCUTANEOUS
  Administered 2014-07-31 – 2014-08-01 (×2): 2 [IU] via SUBCUTANEOUS
  Administered 2014-08-01 – 2014-08-03 (×3): 3 [IU] via SUBCUTANEOUS
  Administered 2014-08-03 – 2014-08-09 (×6): 2 [IU] via SUBCUTANEOUS

## 2014-07-18 MED ORDER — FUROSEMIDE 40 MG PO TABS
40.0000 mg | ORAL_TABLET | Freq: Two times a day (BID) | ORAL | Status: DC
  Administered 2014-07-18 (×2): 40 mg via ORAL
  Filled 2014-07-18 (×3): qty 1

## 2014-07-18 NOTE — Discharge Summary (Signed)
Physician Discharge Summary  Patient ID: Travis [name] MRN: [id number] DOB/AGE: [birth year] 68 y.o.  Admit date: 07/10/2014 Discharge date: 07/18/2014    Discharge Diagnoses:  Acute Hypoxemic Respiratory Failure Pulmonary Edema CAP OSA Hypotension Hx HTN Acute on Chronic Diastolic CHF AKI BPH Ileus Anemia Thrombocytopenia Hyperglycemia  Hypothyroidism Anxiety RLS Peripheral Neuropathy Cervical Myelopathy  Acute on Chronic Back Pain                                                                         DISCHARGE PLAN BY DIAGNOSIS     Acute Hypoxemic Respiratory Failure Pulmonary Edema CAP OSA  Discharge Plan: -wean oxygen for sats > 92% -continue aggressive pulmonary hygiene -push PT/OT efforts -PRN albuterol  -Continue nocturnal CPAP as tolerated   Hypotension Acute on Chronic Diastolic CHF Hx HTN  Discharge Plan: -continue metoprolol -hold cardura  AKI  BPH  Discharge Plan: -trend BMP -lasix 40 mg BID, likely will be able to transition to 40 mg daily with monitoring of signs of worsening AKI -hold further zaroxolyn -monitor UOP   Ileus  Discharge Plan: -continue heart healthy diet  Anemia Thrombocytopenia  Discharge Plan: -Trend CBC -continue DVT prophylaxis until consistently ambulatory   Hyperglycemia  Hypothyroidism  Discharge Plan: -SSI -Lantus 20 units  -continue synthroid   Anxiety RLS Peripheral Neuropathy Cervical Myelopathy  Acute on Chronic Back Pain   Discharge Plan: -xanax 0.5 TID PRN -oxycontin 20 mg BID -Skelaxin 400 mg TID -PRN Morphine -Neurontin 300 mg Q8 (half home dose) -Mirapex                   DISCHARGE SUMMARY   Travis [name] is a 68 y.o. y/o male, former smoker with a PMH of DJD, Cervical Myelopathy, Lumbosacral Spinal Stenosis, Gait Disturbance, RLS,  Hypothyroidism, Diet Controlled DM, HTN, RBBB, OSA on CPAP who presented to [facility] ER 7/13 with fever, chills, weakness, body aches, lower  extremity edema & SOB.  Initial work up noted him to have fever of 101 with CXR suggestive of pneumonia.  He was admitted per [facility] with sepsis related to CAP.  There was concern of possible UTI on admission as well.  He was treated for PNA & UTI with IV resuscitation & antibiotics as below.  Cultures to date are negative.  He unfortunately decompensated on day of admission and required intubation for airway protection.  He was transferred to ICU.  Patient was noted to have acute kidney injury in the setting of sepsis. He slowly improved and the patient met extubation criteria on 7/19 at which time he was liberated from mechanical ventilation.  Given lower extremity edema, an ECHO was evaluated which demonstrated an EF of 65%.  Cardiology was consulted for evaluation of CHF.  As renal function improved, recommendations were made for diuresis and he was transitioned to oral lasix as he improved.  Given prolonged hospitalization and deconditioning, CIR was consulted for rehab efforts.  He was cleared for discharge to CIR on 7/21 for ongoing rehab.   See plan above for details.     SIGNIFICANT STUDIES / EVENTS:  7/15 TTE >>> EF 65%  7/19 Extubated   LINES / TUBES:  ETT 7/13 >>> 7/19  OETT 7/13 >>>  7/19  L IJ TLC 7/14 >>> 7/21  R R AL ??? >>> 7/15   CULTURES:  7/13 Blood >>> neg  7/14 Respiratory >>> npof  7/13 Urine >>> multiple morphotypes   ANTIBIOTICS:  Rocephin 7/13 >>> 7/20  Zithromax 7/13 >>> 7/20   Discharge Exam: General: No distress  Neuro: Awake, alert  HEENT: PERRL  Cardiovascular: Regular, no murmurs  Lungs: CTAB  Abdomen: Soft, nontender, bowel sounds diminished  Musculoskeletal: BL LE edema is resolved  Skin: Intact   Filed Vitals:   07/18/14 0600 07/18/14 0700 07/18/14 1100 07/18/14 1433  BP:  167/89 110/80 156/54  Pulse: 73 82 93 70  Temp:  97.9 F (36.6 C) 99.2 F (37.3 C) 98 F (36.7 C)  TempSrc:  Oral Oral Oral  Resp:    20  Height:      Weight:      SpO2:  95% 97% 92% 95%     Discharge Labs  BMET  Recent Labs Lab 07/12/14 0615 07/13/14 0520 07/14/14 0335 07/15/14 0405 07/16/14 0400 07/17/14 0441 07/18/14 0500  NA 138 141 144 144 149* 147 146  K 3.5* 3.7 3.5* 4.5 3.8 3.6* 3.8  CL 98 103 105 104 102 99 99  CO2 20 23 23 25 31  35* 35*  GLUCOSE 146* 151* 201* 174* 189* 175* 216*  BUN 51* 48* 49* 50* 57* 56* 58*  CREATININE 3.14* 1.86* 1.38* 1.47* 1.38* 1.20 1.03  CALCIUM 8.2* 8.1* 8.3* 8.1* 8.8 8.7 9.2  MG 1.6 1.8 2.5 2.2 2.0 2.1 2.7*  PHOS 1.9* 2.5 3.4 3.4 4.2  --   --    CBC  Recent Labs Lab 07/16/14 0400 07/17/14 0441 07/18/14 0500  HGB 11.0* 11.0* 11.8*  HCT 33.7* 34.3* 37.6*  WBC 6.6 6.4 6.5  PLT 167 229 288     Current Medications  Scheduled Meds: . enoxaparin (LOVENOX) injection  40 mg Subcutaneous Q24H  . furosemide  40 mg Oral BID  . gabapentin  300 mg Oral 3 times per day  . insulin aspart  0-15 Units Subcutaneous TID WC  . insulin aspart  0-5 Units Subcutaneous QHS  . insulin glargine  20 Units Subcutaneous QHS  . levothyroxine  150 mcg Per Tube QAC breakfast  . metaxalone  400 mg Oral TID  . metoprolol tartrate  25 mg Oral BID  . OxyCODONE  20 mg Oral Q12H  . pramipexole  0.25 mg Per Tube BID  . sodium chloride  3 mL Intravenous Q12H   Continuous Infusions: . sodium chloride 10 mL/hr at 07/17/14 0800   PRN Meds:.acetaminophen (TYLENOL) oral liquid 160 mg/5 mL, albuterol, ALPRAZolam, morphine injection, ondansetron (ZOFRAN) IV   Follow-up Information   Follow up with Simona Huh, MD. (As indicated post discharge from [facility].)    Specialty:  Family Medicine   Contact information:   [address], [place] [address] [phone]           Disposition: [facility]  Discharged Condition: [name] has met maximum benefit of inpatient care and is medically stable and cleared for discharge.  Patient is pending follow up as above.      Time spent on  disposition:  Greater than 35 minutes.   Signed: Noe Gens, NP-C [facility] Pgr: [phone] Office: [phone]

## 2014-07-18 NOTE — ED Provider Notes (Signed)
Medical screening examination/treatment/procedure(s) were performed by non-physician practitioner and as supervising physician I was immediately available for consultation/collaboration.   EKG Interpretation   Date/Time:  Monday July 10 2014 12:27:33 EDT Ventricular Rate:  111 PR Interval:    QRS Duration: 144 QT Interval:  346 QTC Calculation: 470 R Axis:   63 Text Interpretation:  Atrial fibrillation Right bundle branch block Repol  abnrm suggests ischemia, diffuse leads ED PHYSICIAN INTERPRETATION  AVAILABLE IN [facility] Confirmed by TEST, Record ([id number]) on  07/12/2014 12:21:20 PM        Tanna Furry, MD 07/18/14 [phone]

## 2014-07-18 NOTE — Progress Notes (Signed)
    Subjective:  Feeling better. Wants to know if he can be transferred to telemetry. No chest pain, no significant shortest of breath.  Objective:  Vital Signs in the last 24 hours: Temp:  [97.9 F (36.6 C)-99.3 F (37.4 C)] 97.9 F (36.6 C) (07/21 0700) Pulse Rate:  [64-82] 82 (07/21 0700) BP: (144-171)/(52-89) 167/89 mmHg (07/21 0700) SpO2:  [94 %-98 %] 97 % (07/21 0700) Weight:  [264 lb 1.8 oz (119.8 kg)] 264 lb 1.8 oz (119.8 kg) (07/21 0500)  Intake/Output from previous day: 07/20 0701 - 07/21 0700 In: 19 [P.O.:340; I.V.:240] Out: 2850 [Urine:2850]   Physical Exam: General: Well developed, well nourished, in no acute distress. Head:  Normocephalic and atraumatic. Lungs: Clear to auscultation and percussion, minimal crackles at bases. Heart: Normal S1 and S2.  No murmur, rubs or gallops.  Abdomen: soft, non-tender, positive bowel sounds. Extremities: No clubbing or cyanosis. No edema. Neurologic: Alert and oriented x 3.    Lab Results:  Recent Labs  07/17/14 0441 07/18/14 0500  WBC 6.4 6.5  HGB 11.0* 11.8*  PLT 229 288    Recent Labs  07/17/14 0441 07/18/14 0500  NA 147 146  K 3.6* 3.8  CL 99 99  CO2 35* 35*  GLUCOSE 175* 216*  BUN 56* 58*  CREATININE 1.20 1.03    Telemetry: No adverse arrhythmias Personally viewed.   EKG:  07/17/14-right bundle branch block, sinus rhythm, otherwise unremarkable  Cardiac Studies:  07/12/14 - Ejection fraction 60-65%  . antiseptic oral rinse  15 mL Mouth Rinse QID  . chlorhexidine  15 mL Mouth Rinse BID  . enoxaparin (LOVENOX) injection  40 mg Subcutaneous Q24H  . furosemide  60 mg Intravenous Daily  . gabapentin  300 mg Oral 3 times per day  . insulin aspart  0-20 Units Subcutaneous TID WC & HS  . insulin glargine  15 Units Subcutaneous QHS  . levothyroxine  150 mcg Per Tube QAC breakfast  . metaxalone  400 mg Oral TID  . metoprolol tartrate  12.5 mg Oral BID  . OxyCODONE  10 mg Oral Q12H  . pramipexole   0.25 mg Per Tube BID  . risperiDONE  1 mg Oral QHS  . sodium chloride  3 mL Intravenous Q12H   Assessment/Plan:   68 year old with acute diastolic heart failure, hypertension, obstructive sleep apnea, diabetes, acute kidney injury, pneumonia.  1. Acute diastolic heart failure-dietary indiscretions noted, hot dogs, ham, bacon. Heart failure with preserved ejection fraction, EF normal. Creatinine 1.03. Currently on Lasix 60 mg IV daily. On low-dose metoprolol 12.5 mg twice a day. Total output -4 L net. Breathing has significantly improved. I will transition him over to Lasix 40 mg by mouth twice a day. On discharge, he may be able to get away with 40 mg once a day. Monitor for any signs of acute kidney injury.  2. Acute kidney injury secondary to ATN/hypoperfusion-creatinine as high as 3. This is currently resolved. Consider addition of ACE inhibitor when feel comfortable given his diabetes.  3. Hypertension-mildly elevated today. I will increase his metoprolol to 25 mg twice a day. Consider addition of amlodipine which should not affect renal function.  We will continue to follow. I'm comfortable with transitioning to telemetry bed.   Aleiya Rye, [place] 07/18/2014, 8:13 AM

## 2014-07-18 NOTE — Progress Notes (Signed)
Placed Pt on CPAP via nasal mask (home mask). Auto titrate settings (min 5.0, max 20.0 cmH2O) with 2L O2 bleed in. RN aware.

## 2014-07-18 NOTE — Progress Notes (Signed)
Pt transferred to Rehab from 3W15. Alert and orientated x 4. 02@2L  N/C. Pt's family orientated to rehab schedule, meal times, and visiting hours. Pt looking forward to starting therapy in the morning.

## 2014-07-18 NOTE — Progress Notes (Addendum)
Rehab admissions - I met with pt and his family (wife, dtr and dtr-in-law with grandson) and explained the possibility of inpatient rehab. Questions were answered and information brochures were given. They are interested in pursuing this and I will now open the case with [facility] Medicare to seek insurance authorization.  Per our rehab MD, "Would recommend thoracic and lumbar MRI to evaluate lower extremity weakness. This was recommended by neurology in March but the patient did not follow through on this".   I updated case manager on this recommendation.  I will keep the pt/family and medical team aware of any updates as I await insurance authorization. We will consider inpatient rehab admit pending his medical clearance, insurance authorization and our bed availability.  Please call me with any questions. Thanks.  Nanetta Batty, PT Rehabilitation Admissions Coordinator [phone]

## 2014-07-18 NOTE — Progress Notes (Signed)
NUTRITION FOLLOW UP  Intervention:   -Glucerna shakes TID, each provides 220 kcal, 10 g protein -Encourage adequate PO intake  Nutrition Dx:   Inadequate oral intake related to inability to eat as evidenced by NPO status. Improved with diet advancement, ongoing  Goal:   Patient will meet >/=90% of estimated nutrition needs, not met  Monitor:   PO intake, weight, labs, I/Os  Assessment:   PMHx significant for HTN, hypothyroidism, OSA on CPAP, DM2. Admitted with fevers, chills and SOB x 1 day. Work-up reveals sepsis 2/2 bronchopneumonia.   Intubated 7/13 secondary to progressive respiratory failure. Patient extubated 7/19, TF discontinued.  Diet advanced to Heart Healthy, patient with poor intake 25% of meals. Per family present in room, patient with decreased appetite likely related to prolonged intubation and TF.   Height: Ht Readings from Last 1 Encounters:  07/13/14 6' 1"  (1.854 m)    Weight Status:   Wt Readings from Last 1 Encounters:  07/18/14 264 lb 1.8 oz (119.8 kg)    Re-estimated needs:  Kcal: 2250-2450 kcal Protein: 155-175 g Fluid: per MD  Skin: intact  Diet Order: Cardiac   Intake/Output Summary (Last 24 hours) at 07/18/14 1509 Last data filed at 07/18/14 1200  Gross per 24 hour  Intake    960 ml  Output   1300 ml  Net   -340 ml    Last BM: not recorded   Labs:   Recent Labs Lab 07/14/14 0335 07/15/14 0405 07/16/14 0400 07/17/14 0441 07/18/14 0500  NA 144 144 149* 147 146  K 3.5* 4.5 3.8 3.6* 3.8  CL 105 104 102 99 99  CO2 23 25 31  35* 35*  BUN 49* 50* 57* 56* 58*  CREATININE 1.38* 1.47* 1.38* 1.20 1.03  CALCIUM 8.3* 8.1* 8.8 8.7 9.2  MG 2.5 2.2 2.0 2.1 2.7*  PHOS 3.4 3.4 4.2  --   --   GLUCOSE 201* 174* 189* 175* 216*    CBG (last 3)   Recent Labs  07/17/14 2126 07/18/14 0749 07/18/14 1127  GLUCAP 255* 198* 206*    Scheduled Meds: . enoxaparin (LOVENOX) injection  40 mg Subcutaneous Q24H  . furosemide  40 mg Oral BID   . gabapentin  300 mg Oral 3 times per day  . insulin aspart  0-15 Units Subcutaneous TID WC  . insulin aspart  0-5 Units Subcutaneous QHS  . insulin glargine  20 Units Subcutaneous QHS  . levothyroxine  150 mcg Per Tube QAC breakfast  . metaxalone  400 mg Oral TID  . metoprolol tartrate  25 mg Oral BID  . OxyCODONE  20 mg Oral Q12H  . pramipexole  0.25 mg Per Tube BID  . sodium chloride  3 mL Intravenous Q12H    Continuous Infusions: . sodium chloride 10 mL/hr at 07/17/14 0800    Larey Seat, RD, LDN Pager #: [phone] After-Hours Pager #: [phone]

## 2014-07-18 NOTE — Progress Notes (Signed)
Rehab admissions - I received insurance approval from [facility] and spoke directly with Dr. Oliver Pila. MD stated that pt is medically ready for inpatient rehab and can come today. Bed is available and will admit pt to inpatient rehab later today.  Pt/wife pleased with this news and I will complete admission paperwork shortly. I updated pt's RN on [place] with case management.  Please call me with any questions. Thanks.  Nanetta Batty, PT Rehabilitation Admissions Coordinator [phone]

## 2014-07-18 NOTE — PMR Pre-admission (Signed)
PMR Admission Coordinator Pre-Admission Assessment  Patient: Travis [name] is an 68 y.o., male MRN: [id number] DOB: [birth year] Height: 6' 1" (185.4 cm) Weight: 119.8 kg (264 lb 1.8 oz)              Insurance Information HMO:     PPO: yes     PCP:      IPA:      80/20:      OTHER:  PRIMARY: Blue Medicare      Policy#: [id number]      Subscriber: self CM Name: Alphonsa Overall      Phone#: [phone]      Fax#: [phone] Updates due on   Pre-Cert#:                                  Employer: working as Medical illustrator Benefits:  Phone #: [phone]     Name: Walker Shadow. Date: 12-29-13     Deduct: none      Out of Pocket Max: $4900 (met $503.75)      Life Max: none CIR: $285/day copay for days 1-6, pre-auth needed      SNF: $0/day for days 1-20; $60/day for days 21-100 (100 day visit max) pre-auth needed Outpatient: 100%     Co-Pay: $40 copay, no visit limits Home Health: 100%      Co-Pay: none, no visit limits, pre-auth needed DME: 80%     Co-Pay: 20% Providers: in network  Emergency Contact Information Contact Information   Name Relation Home Work Mobile   Pak,Carolyn Spouse [phone] Daughter   [phone]     Current Medical History  Patient Admitting Diagnosis: Cervical myelopathy with deconditioning after pneumonia requiring intubation  History of Present Illness: Travis [name] is a 68 y.o. 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  CXR with evidence of pneumonia and patient with progressive respiratory failure requiring intubation past admission. He was started on IV antibiotics for sepsis as well as IVF bolus for hypotension. Hospital course complicated by AKI due to ATN, agitation as well as intermittent fevers. He completed antibiotics on 07/19 and tolerated extubation. Renal status slowly improving  with increase in UOP.  Has had SOB that has improved with diuresis. 2D echo with normal LVF. Cardiology consulted for input on CHF and recommended continuing IV diuresis with question of acute on chronic CHF.  PT/OT evaluations done today and CIR recommended due to deconditioned state.   Note: Recommendation for thoracic / lumbar MRI note - patient declined as  He states that he had spine MRI done several years back and he is significantly claustrophobic  Past Medical History  Past Medical History  Diagnosis Date  . PONV (postoperative nausea and vomiting)   . Hypertension   . DJD (degenerative joint disease)     WHOLE SPINE  . Hyperthyroidism     Had Iodine to resolve issue DX 10 YR AGO  . Hypothyroidism     Low d/t treatment of hyperthyroidism  . Sleep apnea     DX  2009/2010 study done at [facility]  . Type II diabetes mellitus     diet controlled  . Heart murmur     Evaluated by cardiologist 4-5 years ago but does not currently see a cardiologist  .  Gait disorder   . Cervical myelopathy     with myelomalacia at C5-6  . RLS (restless legs syndrome)   . Peripheral neuropathy   . 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 Constipation - functional     controlled with miralax  . RBBB     Family History  family history includes CAD in an other family member; Diabetes in his father; Heart disease in his father; Hypertension in his father; Polycythemia in his mother.  Prior Rehab/Hospitalizations: pt had outpt PT for gait/strengthening issues   Current Medications  Current facility-administered medications:0.9 %  sodium chloride infusion, , Intravenous, Continuous, Rush Farmer, MD, Last Rate: 10 mL/hr at 07/17/14 0800;  acetaminophen (TYLENOL) solution 650 mg, 650 mg, Oral, Q6H PRN, [name], MD, 650 mg at 07/16/14 2103;  albuterol (PROVENTIL) (2.5 MG/3ML) 0.083% nebulizer solution 2.5 mg, 2.5 mg, Nebulization, Q2H PRN, Jonetta Osgood, MD, 2.5 mg at 07/16/14 1551 ALPRAZolam  (XANAX) tablet 0.5 mg, 0.5 mg, Oral, TID PRN, Doree Fudge, MD, 0.5 mg at 07/18/14 1259;  enoxaparin (LOVENOX) injection 40 mg, 40 mg, Subcutaneous, Q24H, [name], MD, 40 mg at 07/17/14 2154;  furosemide (LASIX) tablet 40 mg, 40 mg, Oral, BID, Candee Furbish, MD, 40 mg at 07/18/14 3254;  gabapentin (NEURONTIN) capsule 300 mg, 300 mg, Oral, 3 times per day, Doree Fudge, MD, 300 mg at 07/18/14 1259 insulin aspart (novoLOG) injection 0-15 Units, 0-15 Units, Subcutaneous, TID WC, Doree Fudge, MD, 5 Units at 07/18/14 1259;  insulin aspart (novoLOG) injection 0-5 Units, 0-5 Units, Subcutaneous, QHS, Konstantin Zubelevitskiy, MD;  insulin glargine (LANTUS) injection 20 Units, 20 Units, Subcutaneous, QHS, Konstantin Zubelevitskiy, MD levothyroxine (SYNTHROID, LEVOTHROID) tablet 150 mcg, 150 mcg, Per Tube, QAC breakfast, [name], MD, 150 mcg at 07/18/14 0748;  metaxalone (SKELAXIN) tablet 400 mg, 400 mg, Oral, TID, Doree Fudge, MD, 400 mg at 07/18/14 0934;  metoprolol tartrate (LOPRESSOR) tablet 25 mg, 25 mg, Oral, BID, Candee Furbish, MD, 25 mg at 07/18/14 9826 morphine 4 MG/ML injection 4 mg, 4 mg, Intravenous, Q3H PRN, Mariea Clonts, MD, 4 mg at 07/17/14 0450;  ondansetron [facility]) injection 4 mg, 4 mg, Intravenous, Q6H PRN, Mariea Clonts, MD, 4 mg at 07/16/14 2146;  OxyCODONE (OXYCONTIN) 12 hr tablet 20 mg, 20 mg, Oral, Q12H, Doree Fudge, MD;  pramipexole (MIRAPEX) tablet 0.25 mg, 0.25 mg, Per Tube, BID, [name], MD, 0.25 mg at 07/18/14 0934 sodium chloride 0.9 % injection 3 mL, 3 mL, Intravenous, Q12H, Jonetta Osgood, MD, 3 mL at 07/17/14 2202  Patients Current Diet: Cardiac  Precautions / Restrictions Precautions Precautions: Fall Restrictions Weight Bearing Restrictions: No   Prior Activity Level Community (5-7x/wk): 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 Pt would use his cane for community mobility and use  the old, hand me down walker for at home.  Home Assistive Devices / Equipment Home Equipment: Kasandra Knudsen - single point;Walker - 2 wheels (walker is 70 years old)  Prior Functional Level Prior Function Level of Independence: Needs assistance Gait / Transfers Assistance Needed: Walked with the cane all the time  ADL's / Homemaking Assistance Needed: Independent with bathing/dressing. Could stand in the shower to wash. Wife occasionally helps with socks and shoes  Current Functional Level Cognition  Overall Cognitive Status: Within Functional Limits for tasks assessed Orientation Level: Oriented X4    Extremity Assessment (includes Sensation/Coordination)          ADLs  Overall ADL's : Needs assistance/impaired Grooming: Oral  care;Minimal assistance;Sitting Grooming Details (indicate cue type and reason): extended time and rest breaks to complete task Upper Body Bathing: Moderate assistance Lower Body Bathing: Total assistance;Sit to/from stand General ADL Comments: Pt c/o back pain and hx of back pain. pt and wife educated on log roll to maintain good back health. pt reports "that was good. You girls are good" Pt required sara plus to complete sit<>Stand with total +2 max (A) to achieve upright posture in machine. Pt transfered to chair this session. RN staff educated on the need to maxi move hoyer for return to bed in 2 hrs. Pt and wife educated on 2 hours in chair max for skin intregity at this time.     Mobility  Overal bed mobility: +2 for physical assistance;Needs Assistance Bed Mobility: Rolling;Sidelying to Sit Rolling: Mod assist;+2 for physical assistance Sidelying to sit: Max assist;+2 for physical assistance General bed mobility comments: VC's for sequencing and technique. Instructed in log roll as pt complaining of back pain/spasms. Ongoing back pain issues at baseline. Max +2 for trunk elevation to full sitting position.     Transfers  Overall transfer level: Needs  assistance Equipment used:  [name]) Transfers: Stand Pivot Transfers Stand pivot transfers: Total assist;+2 safety/equipment;+2 physical assistance General transfer comment: Used Sara Plus lift to transition from bed to recliner. Pt required support of the lift as well as under hips with bed pad to power-up into standing and maintain extended hips/knees. Tactile cueing for back extension as pt resting chest and chin on front tray of Sara Plus at times. Pt required extensive input to extend hips with max cueing.    Ambulation / Gait / Stairs / Wheelchair Mobility  Ambulation/Gait General Gait Details: Unable at this time    Posture / Balance Dynamic Sitting Balance Sitting balance - Comments: OT providing posterior support while pt sitting EOB. Max assist required.     Special needs/care consideration BiPAP/CPAP - wears CPAP CPM no  Continuous Drip IV no  Dialysis no         Life Vest no  Oxygen - currently on 3 L O2 Special Bed no  Trach Size no  Wound Vac (area) no        Skin - pt with reddened bottom per wife and tender                               Bowel mgmt: last BM on 07-14-14 Bladder mgmt: currently with foley Diabetic mgmt - was managed by diet alone prior to admit   Previous Home Environment Living Arrangements: Spouse/significant other Available Help at Discharge: Family;Available 24 hours/day Type of Home: House Home Layout: One level Home Access: Stairs to enter Entrance Stairs-Rails: None Entrance Stairs-Number of Steps: 2  Discharge Living Setting Plans for Discharge Living Setting: Patient's home Type of Home at Discharge: House Discharge Home Layout: One level Discharge Home Access: Stairs to enter Entrance Stairs-Rails:  (has brick columns on both sides used for support) Entrance Stairs-Number of Steps: 2 Does the patient have any problems obtaining your medications?: No  Social/Family/Support Systems Patient Roles: Spouse (working full time as an  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 Information: wife [name] is primary contact Anticipated Caregiver: wife Anticipated Ambulance person Information: see above Ability/Limitations of Caregiver: wife is home 24-7 Caregiver Availability: 24/7 Discharge Plan Discussed with Primary Caregiver: Yes Is Caregiver In Agreement with Plan?: Yes Does Caregiver/Family have Issues with Lodging/Transportation while Pt is  in Rehab?: No  Goals/Additional Needs Patient/Family Goal for Rehab: Min Assist with PT/OT, NA for SLP Expected length of stay: 13-18 days Cultural Considerations: none Dietary Needs: heart healthy Equipment Needs: to be determined Pt/Family Agrees to Admission and willing to participate: Yes (spoke with pt, his wife and dtr on 7-21) Program Orientation Provided & Reviewed with Pt/Caregiver Including Roles  & Responsibilities: Yes   Decrease burden of Care through IP rehab admission: NA  Possible need for SNF placement upon discharge: not anticipated  Patient Condition: This patient's condition remains as documented in the consult dated 07-17-14, in which the Rehabilitation Physician determined and documented that the patient's condition is appropriate for intensive rehabilitative care in an inpatient rehabilitation facility. Will admit to inpatient rehab today.  Preadmission Screen Completed By:  Nanetta Batty, PT 07/18/2014 3:11 PM ______________________________________________________________________   Discussed status with Dr. Naaman Plummer on 07-18-14 at 1510 and received telephone approval for admission today.  Admission Coordinator:  Andris Brothers,PT time1510/Date7-21-15

## 2014-07-18 NOTE — H&P (View-Only) (Signed)
Physical Medicine and Rehabilitation Admission H&P    Chief Complaint  Patient presents with  . Deconditioning   HPI: Travis [name] is a 68 y.o. 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 CXR with evidence of pneumonia and patient with progressive respiratory failure requiring intubation past admission. He was started on IV antibiotics for sepsis as well as IVF bolus for hypotension. Hospital course complicated by AKI due to ATN, agitation as well as intermittent fevers. Renal ultrasound normal without hydronephrosis.  He completed antibiotics course was extubated on 07/19 without difficulty.  Renal status slowly improving with increase in UOP. Has had SOB that has improved with diuresis. 2D echo with normal LVF. Cardiology consulted for input on CHF and recommended continuing IV diuresis with question of acute on chronic CHF with 4 L output and was changed to oral lasix bid today. Cards recommends monitoring for signs of AKI and may be able to decrease lasix to daily at discharge.  Therapy evaluations done yesterday and CIR recommended by rehab team.  Agitation resolved therefore Risperdal  d/c and chronic pain/anxiety medications resumed today. Patient medically stable and admitted today for progressive therapies.    Review of Systems  Constitutional: Negative for fever and chills.  Eyes: Positive for double vision. Negative for blurred vision.  Respiratory: Positive for cough and sputum production.   Cardiovascular: Positive for leg swelling. Negative for chest pain and palpitations.  Gastrointestinal: Positive for constipation. Negative for heartburn, nausea, vomiting and abdominal pain.  Musculoskeletal: Positive for back pain.  Skin: Negative for rash.  Neurological: Positive for dizziness, tingling and sensory change. Negative for headaches.    Psychiatric/Behavioral: The patient is nervous/anxious.   back spasms  Past Medical History  Diagnosis Date  . PONV (postoperative nausea and vomiting)   . Hypertension   . DJD (degenerative joint disease)     WHOLE SPINE  . Hyperthyroidism     Had Iodine to resolve issue DX 10 YR AGO  . Hypothyroidism     Low d/t treatment of hyperthyroidism  . Sleep apnea     DX  2009/2010 study done at [facility]  . Type II diabetes mellitus     diet controlled  . Heart murmur     Evaluated by cardiologist 4-5 years ago but does not currently see a cardiologist  . Gait disorder   . Cervical myelopathy     with myelomalacia at C5-6  . RLS (restless legs syndrome)   . Peripheral neuropathy   . 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 Constipation - functional     controlled with miralax  . RBBB     Past Surgical History  Procedure Laterality Date  . Cholecystectomy    . Back surgery      6 surgeries  . Lumbar laminectomy/decompression microdiscectomy  07/14/2012    Procedure: LUMBAR LAMINECTOMY/DECOMPRESSION MICRODISCECTOMY 2 LEVELS;  Surgeon: Eustace Moore, MD;  Location: [facility];  Service: Neurosurgery;  Laterality: Left;  Lumbar two three laminectomy, left thoracic four five transpedicular diskectomy    Family History  Problem Relation Age of Onset  . Polycythemia Mother   . Diabetes Father   . Hypertension Father   . Heart disease Father   . CAD      1 of his 4 brothers and father has CAD    Social History:  Married. 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 He reports that he quit smoking about 29 years ago. His smoking use included Cigarettes. He has a 40 pack-year smoking history. He quit smokeless tobacco use about 29 years ago. He reports that he does not drink alcohol or use illicit drugs.    Allergies  Allergen Reactions  . Other Other (See Comments)    Anesthesia makes sick, must have antinausea  medication in advance.   Medications Prior to Admission  Medication Sig Dispense Refill  . ciclopirox (PENLAC) 8 % solution Apply 1 application topically at bedtime as needed (dry legs).       . diclofenac (VOLTAREN) 75 MG EC tablet Take 75 mg by mouth daily after lunch. About 4pm      . diphenhydrAMINE (BENADRYL) 25 MG tablet Take 25 mg by mouth at bedtime as needed for sleep.      [name] doxazosin (CARDURA) 4 MG tablet Take 4 mg by mouth at bedtime.      . gabapentin (NEURONTIN) 600 MG tablet Take 600 mg by mouth 3 (three) times daily.      [name] ibuprofen (ADVIL,MOTRIN) 200 MG tablet Take 400 mg by mouth at bedtime as needed for moderate pain.      [name] levothyroxine (SYNTHROID, LEVOTHROID) 150 MCG tablet Take 150 mcg by mouth daily.      [name] lisinopril-hydrochlorothiazide (PRINZIDE,ZESTORETIC) 20-12.5 MG per tablet Take 1 tablet by mouth every evening.       . metoprolol succinate (TOPROL-XL) 25 MG 24 hr tablet Take 25 mg by mouth daily.       [name] oxybutynin (DITROPAN) 5 MG tablet Take 5 mg by mouth daily as needed for bladder spasms.      . pramipexole (MIRAPEX) 0.125 MG tablet Take 0.25 mg by mouth 2 (two) times daily.         Home: Home Living Family/patient expects to be discharged to:: Private residence Living Arrangements: Spouse/significant other Available Help at Discharge: Family;Available 24 hours/day Type of Home: House Home Access: Stairs to enter [employer] of Steps: 2 Entrance Stairs-Rails: None Home Layout: One level Home Equipment: Cane - single point;Walker - 2 wheels (walker is 68 years old)   Functional History: Prior Function Level of Independence: Needs assistance Gait / Transfers Assistance Needed: Walked with the cane all the time  ADL's / Homemaking Assistance Needed: Independent with bathing/dressing. Could stand in the shower to wash. Wife occasionally helps with socks and shoes  Functional Status:  Mobility: Bed Mobility Overal bed mobility: +2 for  physical assistance;Needs Assistance Bed Mobility: Rolling;Sidelying to Sit Rolling: Mod assist;+2 for physical assistance Sidelying to sit: Max assist;+2 for physical assistance General bed mobility comments: VC's for sequencing and technique. Instructed in log roll as pt complaining of back pain/spasms. Ongoing back pain issues at baseline. Max +2 for trunk elevation to full sitting position.  Transfers Overall transfer level: Needs assistance Equipment used:  [name]) Transfers: Stand Pivot Transfers Stand pivot transfers: Total assist;+2 safety/equipment;+2 physical assistance General transfer comment: Used Sara Plus lift to transition from bed to recliner. Pt required support of the lift as well as under hips with bed pad to power-up into standing and maintain extended hips/knees. Tactile cueing for back extension as pt resting chest and chin on front tray of Sara Plus at times. Pt required extensive input to extend hips with max cueing. Ambulation/Gait General Gait Details: Unable at this time  ADL: ADL Overall ADL's : Needs assistance/impaired Grooming: Oral care;Minimal assistance;Sitting Grooming Details (indicate cue type and reason): extended time and rest breaks to complete task Upper Body Bathing: Moderate assistance Lower Body Bathing: Total assistance;Sit to/from stand General ADL Comments: Pt c/o back pain and hx of back pain. pt and wife educated on log roll to maintain good back health. pt reports "that was good. You girls are good" Pt required sara plus to complete sit<>Stand with total +2 max (A) to achieve upright posture in machine. Pt transfered to chair this session. RN staff educated on the need to maxi move hoyer for return to bed in 2 hrs. Pt and wife educated on 2 hours in chair max for skin intregity at this time.   Cognition: Cognition Overall Cognitive Status: Within Functional Limits for tasks assessed Orientation Level: Oriented  X4 Cognition Arousal/Alertness: Awake/alert Behavior During Therapy: Flat affect Overall Cognitive Status: Within Functional Limits for tasks assessed  Physical Exam: Blood pressure 156/54, pulse 70, temperature 98 F (36.7 C), temperature source Oral, resp. rate 20, height 6' 1" (1.854 m), weight 119.8 kg (264 lb 1.8 oz), SpO2 95.00%. Physical Exam   Constitutional: He is oriented to person, place, and time. He appears well-developed and well-nourished. Side lying in bed HENT: dentition fair. Oral mucosa pink/moist Head: Normocephalic and atraumatic.  Eyes: Conjunctivae are normal. Pupils are equal, round, and reactive to light.  Neck: Normal range of motion. Neck supple.  Cardiovascular: Normal rate and regular rhythm. No murmurs Respiratory: Effort normal and breath sounds normal. No respiratory distress. He has wheezes in upper airway.  GI: Soft. Bowel sounds are normal. He exhibits no distension. There is no tenderness.  Musculoskeletal: He exhibits trace edema. Has low back pain with minimal lower extremity movement and with bed mobility Neurological: He is alert and oriented to person, place, and time.  Speech slow. Tremors BUE. Able to follow basic commands without difficulty. Dysesthesias bilateral feet.   CN exam normal.  extremity: Intrinsic atrophy bilateral hands.  Motor strength is 4 minus bilateral grip 5 bilateral biceps and deltoid, 4/5 left triceps 4/5 right tricep, wrist extension 4+ 2 minus hip flexion knee extension ankle dorsiflexion and plantarflexion  Sensation intact to light touch in bilateral upper and lower extremities except for decreased proprioception in bilateral feet.  Deep tendon reflexes 2+ bilateral knees trace bilateral biceps and brachioradialis and triceps, 1+ patella,achilles Ankles without clonus Skin: chronic changes on feet/ dry, scaly skin, small reddened area left buttock, small stage 2 right buttock. Back surgery site intact Psych: he's  withdrawn, flat. Non-anxious on my eval   Results for orders placed during the hospital encounter of 07/10/14 (from the past 48 hour(s))  GLUCOSE, CAPILLARY     Status: Abnormal   Collection Time    07/16/14  4:37 PM      Result Value Ref Range   Glucose-Capillary 185 (*) 70 - 99 mg/dL  GLUCOSE, CAPILLARY     Status: Abnormal   Collection Time    07/16/14  7:31 PM      Result Value Ref Range   Glucose-Capillary 212 (*) 70 - 99 mg/dL  GLUCOSE, CAPILLARY     Status: Abnormal   Collection Time    07/16/14 11:35 PM      Result Value Ref Range   Glucose-Capillary 195 (*) 70 - 99 mg/dL  GLUCOSE, CAPILLARY     Status: Abnormal   Collection Time    07/17/14  4:27 AM  Result Value Ref Range   Glucose-Capillary 169 (*) 70 - 99 mg/dL  CBC     Status: Abnormal   Collection Time    07/17/14  4:41 AM      Result Value Ref Range   WBC 6.4  4.0 - 10.5 K/uL   RBC 3.69 (*) 4.22 - 5.81 MIL/uL   Hemoglobin 11.0 (*) 13.0 - 17.0 g/dL   HCT 34.3 (*) 39.0 - 52.0 %   MCV 93.0  78.0 - 100.0 fL   MCH 29.8  26.0 - 34.0 pg   MCHC 32.1  30.0 - 36.0 g/dL   RDW 15.2  11.5 - 15.5 %   Platelets 229  150 - 400 K/uL   Comment: DELTA CHECK NOTED  BASIC METABOLIC PANEL     Status: Abnormal   Collection Time    07/17/14  4:41 AM      Result Value Ref Range   Sodium 147  137 - 147 mEq/L   Potassium 3.6 (*) 3.7 - 5.3 mEq/L   Chloride 99  96 - 112 mEq/L   CO2 35 (*) 19 - 32 mEq/L   Glucose, Bld 175 (*) 70 - 99 mg/dL   BUN 56 (*) 6 - 23 mg/dL   Creatinine, Ser 1.20  0.50 - 1.35 mg/dL   Calcium 8.7  8.4 - 10.5 mg/dL   GFR calc non Af Amer 60 (*) >90 mL/min   GFR calc Af Amer 69 (*) >90 mL/min   Comment: (NOTE)     The eGFR has been calculated using the CKD EPI equation.     This calculation has not been validated in all clinical situations.     eGFR's persistently <90 mL/min signify possible Chronic Kidney     Disease.   Anion gap 13  5 - 15  MAGNESIUM     Status: None   Collection Time     07/17/14  4:41 AM      Result Value Ref Range   Magnesium 2.1  1.5 - 2.5 mg/dL  GLUCOSE, CAPILLARY     Status: Abnormal   Collection Time    07/17/14  7:25 AM      Result Value Ref Range   Glucose-Capillary 188 (*) 70 - 99 mg/dL  GLUCOSE, CAPILLARY     Status: Abnormal   Collection Time    07/17/14 11:09 AM      Result Value Ref Range   Glucose-Capillary 203 (*) 70 - 99 mg/dL  GLUCOSE, CAPILLARY     Status: Abnormal   Collection Time    07/17/14  3:45 PM      Result Value Ref Range   Glucose-Capillary 183 (*) 70 - 99 mg/dL  GLUCOSE, CAPILLARY     Status: Abnormal   Collection Time    07/17/14  9:26 PM      Result Value Ref Range   Glucose-Capillary 255 (*) 70 - 99 mg/dL  CBC     Status: Abnormal   Collection Time    07/18/14  5:00 AM      Result Value Ref Range   WBC 6.5  4.0 - 10.5 K/uL   RBC 3.90 (*) 4.22 - 5.81 MIL/uL   Hemoglobin 11.8 (*) 13.0 - 17.0 g/dL   HCT 37.6 (*) 39.0 - 52.0 %   MCV 96.4  78.0 - 100.0 fL   MCH 30.3  26.0 - 34.0 pg   MCHC 31.4  30.0 - 36.0 g/dL   RDW 15.3  11.5 - 15.5 %  Platelets 288  150 - 400 K/uL  BASIC METABOLIC PANEL     Status: Abnormal   Collection Time    07/18/14  5:00 AM      Result Value Ref Range   Sodium 146  137 - 147 mEq/L   Potassium 3.8  3.7 - 5.3 mEq/L   Chloride 99  96 - 112 mEq/L   CO2 35 (*) 19 - 32 mEq/L   Glucose, Bld 216 (*) 70 - 99 mg/dL   BUN 58 (*) 6 - 23 mg/dL   Creatinine, Ser 1.03  0.50 - 1.35 mg/dL   Calcium 9.2  8.4 - 10.5 mg/dL   GFR calc non Af Amer 72 (*) >90 mL/min   GFR calc Af Amer 84 (*) >90 mL/min   Comment: (NOTE)     The eGFR has been calculated using the CKD EPI equation.     This calculation has not been validated in all clinical situations.     eGFR's persistently <90 mL/min signify possible Chronic Kidney     Disease.   Anion gap 12  5 - 15  MAGNESIUM     Status: Abnormal   Collection Time    07/18/14  5:00 AM      Result Value Ref Range   Magnesium 2.7 (*) 1.5 - 2.5 mg/dL   TRIGLYCERIDES     Status: None   Collection Time    07/18/14  5:00 AM      Result Value Ref Range   Triglycerides 145  <150 mg/dL  GLUCOSE, CAPILLARY     Status: Abnormal   Collection Time    07/18/14  7:49 AM      Result Value Ref Range   Glucose-Capillary 198 (*) 70 - 99 mg/dL  GLUCOSE, CAPILLARY     Status: Abnormal   Collection Time    07/18/14 11:27 AM      Result Value Ref Range   Glucose-Capillary 206 (*) 70 - 99 mg/dL   No results found.     Medical Problem List and Plan: 1. Functional deficits secondary to Cervical myelopathy with deconditioning after pneumonia requiring intubation 2.  DVT Prophylaxis/Anticoagulation: Pharmaceutical: Lovenox 3. Chronic Pain Management: Off Volatren and NSAIDS. Monitor mental status with addition of Skelaxin as well as OxyContin in the last 24 hours.   4. Mood:  Monitor as mentation improves. Will have LCSW follow for evaluation and support.  5. Neuropsych: This patient is capable of making decisions on his own behalf. 6. 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  8. HTN: Continue metoprolol and lasix. Cards recommendations: if needed to add norvasc and resume ACE if renal status stable.  9. Acute on chronic diastolic CHF: Daily weights. Low salt diet. Patient non compliant at home--will have RD educate on appropriate diet.  10. Hypotension with AKI: resolved. Will recheck labs in am.  11. BPH: foley d/c yesterday. Off cardura and ditropan at this time. Monitor voiding.  12. DM type 2 diet controlled: Not controlled on diet alone and patient with non compliance--Hgb A1c-7.0. BS poorly controlled due to stress/infection requiring lantus. Will monitor BS with ac/hs checks and start patient on oral agent. Will wean off lantus and titrate medication as indicated.   13. Insomnia with RLS: Continue Mirapex. Monitor sleep wake cycle.  14.  Constipation: will start bowel program.   Post Admission Physician Evaluation: 1. Functional deficits secondary  to Cervical myelopathy  with deconditioning after pneumonia requiring intubation 1.  2. Patient is admitted to receive collaborative, interdisciplinary care between the physiatrist, rehab nursing staff, and therapy team. 3. Patient's level of medical complexity and substantial therapy needs in context of that medical necessity cannot be provided at a lesser intensity of care such as a SNF. 4. Patient has experienced substantial functional loss from his/her baseline which was documented above under the "Functional History" and "Functional Status" headings.  Judging by the patient's diagnosis, physical exam, and functional history, the patient has potential for functional progress which will result in measurable gains while on inpatient rehab.  These gains will be of substantial and practical use upon discharge  in facilitating mobility and self-care at the household level. 5. Physiatrist will provide 24 hour management of medical needs as well as oversight of the therapy plan/treatment and provide guidance as appropriate regarding the interaction of the two. 6. 24 hour rehab nursing will assist with bladder management, bowel management, safety, skin/wound care, disease management, medication administration, pain management and patient education  and help integrate therapy concepts, techniques,education, etc. 7. PT will assess and treat for/with: Lower extremity strength, range of motion, stamina, balance, functional mobility, safety, adaptive techniques and equipment, pain mgt, NMR, family education, egosupport.   Goals are: supervision to min assist. 8. OT will assess and treat for/with: ADL's, functional mobility, safety, upper extremity strength, adaptive techniques and equipment, back pain, NMR, leisure awareness, community reintegration.   Goals are: supervision to min assist. 9. SLP will  assess and treat for/with: n/a.  Goals are: n/a. 10. Case Management and Social Worker will assess and treat for psychological issues and discharge planning. 11. Team conference will be held weekly to assess progress toward goals and to determine barriers to discharge. 12. Patient will receive at least 3 hours of therapy per day at least 5 days per week. 13. ELOS: 13-18 days       14. Prognosis:  good     Meredith Staggers, MD, [facility]   07/18/2014

## 2014-07-18 NOTE — Progress Notes (Signed)
PULMONARY / CRITICAL CARE MEDICINE  Name: NISHAAN KINNER MRN: [id number] DOB: 1946-07-23    ADMISSION DATE:  07/10/2014 CONSULTATION DATE:  07/10/2014  REFERRING MD :  Sloan Leiter  CHIEF COMPLAINT:  Short of breath  BRIEF PATIENT DESCRIPTION: 68 yo former smoker admitted 7/13 with pneumonia and respiratory failure requiring intubation.  SIGNIFICANT STUDIES / EVENTS: 7/15  TTE >>> EF 65% 7/19  Extubated  LINES / TUBES: ETT 7/13 >>> 7/19 OETT 7/13 >>> 7/19 L IJ TLC 7/14 >>> 7/21 R R AL ??? >>> 7/15  CULTURES: 7/13  Blood  >>> neg 7/14  Respiratory  >>> npof 7/13  Urine  >>> multiple morphotypes   ANTIBIOTICS: Rocephin 7/13 >>> 7/20 Zithromax 7/13 >>> 7/20  INTERVAL HISTORY:  Better pain control.  Some anxiety.  VITAL SIGNS: Temp:  [97.9 F (36.6 C)-99.3 F (37.4 C)] 99.2 F (37.3 C) (07/21 1100) Pulse Rate:  [64-93] 93 (07/21 1100) BP: (110-171)/(54-89) 110/80 mmHg (07/21 1100) SpO2:  [92 %-98 %] 92 % (07/21 1100) Weight:  [119.8 kg (264 lb 1.8 oz)] 119.8 kg (264 lb 1.8 oz) (07/21 0500)  HEMODYNAMICS: CVP:  [5 mmHg-9 mmHg] 5 mmHg  VENTILATOR SETTINGS:   INTAKE / OUTPUT: Intake/Output     07/20 0701 - 07/21 0700 07/21 0701 - 07/22 0700   P.O. 340 240   I.V. (mL/kg) 240 (2) 30 (0.3)   NG/GT     IV Piggyback     Total Intake(mL/kg) 580 (4.8) 270 (2.3)   Urine (mL/kg/hr) 2850 (1)    Total Output 2850     Net -2270 +270         PHYSICAL EXAMINATION: General:  No distress Neuro:  Awake, alert HEENT:  PERRL Cardiovascular:  Regular, no murmurs Lungs:  CTAB Abdomen:  Soft, nontender, bowel sounds diminished Musculoskeletal:  BL LE edema is resolved Skin:  Intact  LABS:  CBC  Recent Labs Lab 07/16/14 0400 07/17/14 0441 07/18/14 0500  WBC 6.6 6.4 6.5  HGB 11.0* 11.0* 11.8*  HCT 33.7* 34.3* 37.6*  PLT 167 229 288   Coag's No results found for this basename: APTT, INR,  in the last 168 hours  BMET  Recent Labs Lab 07/16/14 0400 07/17/14 0441  07/18/14 0500  NA 149* 147 146  K 3.8 3.6* 3.8  CL 102 99 99  CO2 31 35* 35*  BUN 57* 56* 58*  CREATININE 1.38* 1.20 1.03  GLUCOSE 189* 175* 216*   Electrolytes  Recent Labs Lab 07/14/14 0335 07/15/14 0405 07/16/14 0400 07/17/14 0441 07/18/14 0500  CALCIUM 8.3* 8.1* 8.8 8.7 9.2  MG 2.5 2.2 2.0 2.1 2.7*  PHOS 3.4 3.4 4.2  --   --    Sepsis Markers  Recent Labs Lab 07/12/14 0615 07/14/14 0335  PROCALCITON 11.04 3.08   ABG  Recent Labs Lab 07/13/14 0350 07/14/14 0335 07/15/14 0500  PHART 7.409 7.448 7.431  PCO2ART 38.0 34.1* 38.9  PO2ART 60.6* 157.0* 67.0*   Liver Enzymes No results found for this basename: AST, ALT, ALKPHOS, BILITOT, ALBUMIN,  in the last 168 hours  Cardiac Enzymes No results found for this basename: TROPONINI, PROBNP,  in the last 168 hours  Glucose  Recent Labs Lab 07/17/14 0427 07/17/14 0725 07/17/14 1109 07/17/14 1545 07/17/14 2126 07/18/14 0749  GLUCAP 169* 188* 203* 183* 255* 198*   IMAGING:   No results found.  ASSESSMENT / PLAN:  PULMONARY A: Acute hypoxemic respiratory failure, resolved 7/19 Pulmonary edema, resolved Possible pneumonia ( CAP ), completed abx  OSA Atelectasis / low lung volumes P:   Goal SpO2>92 Supplemental oxygen Incentive spirometry / flutter valve Albuterol PRN  CARDIOVASCULAR A:  Probable acute on chronic diastolic heart failure Hypotension, likely sedation-related, resolved P:  Cardiology following Restart Metoprolol 25 bid Hold Cardura  RENAL A:   AKI, resolved Improved fluid balance BPH P:   Trend BMP Lasix 40 po bid D/c Zaroxolyn D/c Foley  GASTROINTESTINAL A:   Nutrition Ileus, improved GI Px is not indicated P:   Cardiac diet  HEMATOLOGIC A:   Anemia Thrombocytopenia, resolved VTE Px P:  Trend CBC Lovenox  INFECTIOUS A:   CAP. P:   Abx completed  ENDOCRINE A:   DM Hyperglycemia Hypothyroidism  P:   SSI Increase  Lantus 20 Levothyroxine    NEUROLOGIC A:   Anxiety Restless leg syndrome Peripheral neuropathy Cervical myelopathy Acute on chronic back pain P:   Start Xanax 0.5 TID PRN Increase OxyContin 20 bid Skelaxin 400 tid Morphine PRN D/c Risperdal Neurontin to 300 mg q8h ( half dose ) Mirapex PT / OT CIR evaluation appreciated Recommendation for thoracic / lumbar MRI note - patient declined as  He states that he had spine MRI done several years back and he is significantly claustrophobic  Transfer to telemetry under PCCM.  Likely d/c to CIR soon.   I have personally obtained history, examined patient, evaluated and interpreted laboratory and imaging results, reviewed medical records, formulated assessment / plan and placed orders.  Doree Fudge, MD Pulmonary and [facility] Pager: [phone]  07/18/2014, 11:30 AM

## 2014-07-18 NOTE — H&P (Signed)
Physical Medicine and Rehabilitation Admission H&P    Chief Complaint  Patient presents with  . Deconditioning   HPI: Travis [name] is a 68 y.o. 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 CXR with evidence of pneumonia and patient with progressive respiratory failure requiring intubation past admission. He was started on IV antibiotics for sepsis as well as IVF bolus for hypotension. Hospital course complicated by AKI due to ATN, agitation as well as intermittent fevers. Renal ultrasound normal without hydronephrosis.  He completed antibiotics course was extubated on 07/19 without difficulty.  Renal status slowly improving with increase in UOP. Has had SOB that has improved with diuresis. 2D echo with normal LVF. Cardiology consulted for input on CHF and recommended continuing IV diuresis with question of acute on chronic CHF with 4 L output and was changed to oral lasix bid today. Cards recommends monitoring for signs of AKI and may be able to decrease lasix to daily at discharge.  Therapy evaluations done yesterday and CIR recommended by rehab team.  Agitation resolved therefore Risperdal  d/c and chronic pain/anxiety medications resumed today. Patient medically stable and admitted today for progressive therapies.    Review of Systems  Constitutional: Negative for fever and chills.  Eyes: Positive for double vision. Negative for blurred vision.  Respiratory: Positive for cough and sputum production.   Cardiovascular: Positive for leg swelling. Negative for chest pain and palpitations.  Gastrointestinal: Positive for constipation. Negative for heartburn, nausea, vomiting and abdominal pain.  Musculoskeletal: Positive for back pain.  Skin: Negative for rash.  Neurological: Positive for dizziness, tingling and sensory change. Negative for headaches.    Psychiatric/Behavioral: The patient is nervous/anxious.   back spasms  Past Medical History  Diagnosis Date  . PONV (postoperative nausea and vomiting)   . Hypertension   . DJD (degenerative joint disease)     WHOLE SPINE  . Hyperthyroidism     Had Iodine to resolve issue DX 10 YR AGO  . Hypothyroidism     Low d/t treatment of hyperthyroidism  . Sleep apnea     DX  2009/2010 study done at [facility]  . Type II diabetes mellitus     diet controlled  . Heart murmur     Evaluated by cardiologist 4-5 years ago but does not currently see a cardiologist  . Gait disorder   . Cervical myelopathy     with myelomalacia at C5-6  . RLS (restless legs syndrome)   . Peripheral neuropathy   . 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 Constipation - functional     controlled with miralax  . RBBB     Past Surgical History  Procedure Laterality Date  . Cholecystectomy    . Back surgery      6 surgeries  . Lumbar laminectomy/decompression microdiscectomy  07/14/2012    Procedure: LUMBAR LAMINECTOMY/DECOMPRESSION MICRODISCECTOMY 2 LEVELS;  Surgeon: Eustace Moore, MD;  Location: [facility];  Service: Neurosurgery;  Laterality: Left;  Lumbar two three laminectomy, left thoracic four five transpedicular diskectomy    Family History  Problem Relation Age of Onset  . Polycythemia Mother   . Diabetes Father   . Hypertension Father   . Heart disease Father   . CAD      1 of his 4 brothers and father has CAD    Social History:  Married. 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 He reports that he quit smoking about 29 years ago. His smoking use included Cigarettes. He has a 40 pack-year smoking history. He quit smokeless tobacco use about 29 years ago. He reports that he does not drink alcohol or use illicit drugs.    Allergies  Allergen Reactions  . Other Other (See Comments)    Anesthesia makes sick, must have antinausea  medication in advance.   Medications Prior to Admission  Medication Sig Dispense Refill  . ciclopirox (PENLAC) 8 % solution Apply 1 application topically at bedtime as needed (dry legs).       . diclofenac (VOLTAREN) 75 MG EC tablet Take 75 mg by mouth daily after lunch. About 4pm      . diphenhydrAMINE (BENADRYL) 25 MG tablet Take 25 mg by mouth at bedtime as needed for sleep.      . doxazosin (CARDURA) 4 MG tablet Take 4 mg by mouth at bedtime.      . gabapentin (NEURONTIN) 600 MG tablet Take 600 mg by mouth 3 (three) times daily.      . ibuprofen (ADVIL,MOTRIN) 200 MG tablet Take 400 mg by mouth at bedtime as needed for moderate pain.      . levothyroxine (SYNTHROID, LEVOTHROID) 150 MCG tablet Take 150 mcg by mouth daily.      . lisinopril-hydrochlorothiazide (PRINZIDE,ZESTORETIC) 20-12.5 MG per tablet Take 1 tablet by mouth every evening.       . metoprolol succinate (TOPROL-XL) 25 MG 24 hr tablet Take 25 mg by mouth daily.       . oxybutynin (DITROPAN) 5 MG tablet Take 5 mg by mouth daily as needed for bladder spasms.      . pramipexole (MIRAPEX) 0.125 MG tablet Take 0.25 mg by mouth 2 (two) times daily.         Home: Home Living Family/patient expects to be discharged to:: Private residence Living Arrangements: Spouse/significant other Available Help at Discharge: Family;Available 24 hours/day Type of Home: House Home Access: Stairs to enter Entrance Stairs-Number of Steps: 2 Entrance Stairs-Rails: None Home Layout: One level Home Equipment: Cane - single point;Walker - 2 wheels (walker is 68 years old)   Functional History: Prior Function Level of Independence: Needs assistance Gait / Transfers Assistance Needed: Walked with the cane all the time  ADL's / Homemaking Assistance Needed: Independent with bathing/dressing. Could stand in the shower to wash. Wife occasionally helps with socks and shoes  Functional Status:  Mobility: Bed Mobility Overal bed mobility: +2 for  physical assistance;Needs Assistance Bed Mobility: Rolling;Sidelying to Sit Rolling: Mod assist;+2 for physical assistance Sidelying to sit: Max assist;+2 for physical assistance General bed mobility comments: VC's for sequencing and technique. Instructed in log roll as pt complaining of back pain/spasms. Ongoing back pain issues at baseline. Max +2 for trunk elevation to full sitting position.  Transfers Overall transfer level: Needs assistance Equipment used:  (Sara Plus) Transfers: Stand Pivot Transfers Stand pivot transfers: Total assist;+2 safety/equipment;+2 physical assistance General transfer comment: Used Sara Plus lift to transition from bed to recliner. Pt required support of the lift as well as under hips with bed pad to power-up into standing and maintain extended hips/knees. Tactile cueing for back extension as pt resting chest and chin on front tray of Sara Plus at times. Pt required extensive input to extend hips with max cueing. Ambulation/Gait General Gait Details: Unable at this time      ADL: ADL Overall ADL's : Needs assistance/impaired Grooming: Oral care;Minimal assistance;Sitting Grooming Details (indicate cue type and reason): extended time and rest breaks to complete task Upper Body Bathing: Moderate assistance Lower Body Bathing: Total assistance;Sit to/from stand General ADL Comments: Pt c/o back pain and hx of back pain. pt and wife educated on log roll to maintain good back health. pt reports "that was good. You girls are good" Pt required sara plus to complete sit<>Stand with total +2 max (A) to achieve upright posture in machine. Pt transfered to chair this session. RN staff educated on the need to maxi move hoyer for return to bed in 2 hrs. Pt and wife educated on 2 hours in chair max for skin intregity at this time.   Cognition: Cognition Overall Cognitive Status: Within Functional Limits for tasks assessed Orientation Level: Oriented  X4 Cognition Arousal/Alertness: Awake/alert Behavior During Therapy: Flat affect Overall Cognitive Status: Within Functional Limits for tasks assessed  Physical Exam: Blood pressure 156/54, pulse 70, temperature 98 F (36.7 C), temperature source Oral, resp. rate 20, height 6' 1" (1.854 m), weight 119.8 kg (264 lb 1.8 oz), SpO2 95.00%. Physical Exam   Constitutional: He is oriented to person, place, and time. He appears well-developed and well-nourished. Side lying in bed HENT: dentition fair. Oral mucosa pink/moist Head: Normocephalic and atraumatic.  Eyes: Conjunctivae are normal. Pupils are equal, round, and reactive to light.  Neck: Normal range of motion. Neck supple.  Cardiovascular: Normal rate and regular rhythm. No murmurs Respiratory: Effort normal and breath sounds normal. No respiratory distress. He has wheezes in upper airway.  GI: Soft. Bowel sounds are normal. He exhibits no distension. There is no tenderness.  Musculoskeletal: He exhibits trace edema. Has low back pain with minimal lower extremity movement and with bed mobility Neurological: He is alert and oriented to person, place, and time.  Speech slow. Tremors BUE. Able to follow basic commands without difficulty. Dysesthesias bilateral feet.   CN exam normal.  extremity: Intrinsic atrophy bilateral hands.  Motor strength is 4 minus bilateral grip 5 bilateral biceps and deltoid, 4/5 left triceps 4/5 right tricep, wrist extension 4+ 2 minus hip flexion knee extension ankle dorsiflexion and plantarflexion  Sensation intact to light touch in bilateral upper and lower extremities except for decreased proprioception in bilateral feet.  Deep tendon reflexes 2+ bilateral knees trace bilateral biceps and brachioradialis and triceps, 1+ patella,achilles Ankles without clonus Skin: chronic changes on feet/ dry, scaly skin, small reddened area left buttock, small stage 2 right buttock. Back surgery site intact Psych: he's  withdrawn, flat. Non-anxious on my eval   Results for orders placed during the hospital encounter of 07/10/14 (from the past 48 hour(s))  GLUCOSE, CAPILLARY     Status: Abnormal   Collection Time    07/16/14  4:37 PM      Result Value Ref Range   Glucose-Capillary 185 (*) 70 - 99 mg/dL  GLUCOSE, CAPILLARY     Status: Abnormal   Collection Time    07/16/14  7:31 PM      Result Value Ref Range   Glucose-Capillary 212 (*) 70 - 99 mg/dL  GLUCOSE, CAPILLARY     Status: Abnormal   Collection Time    07/16/14 11:35 PM      Result Value Ref Range   Glucose-Capillary 195 (*) 70 - 99 mg/dL  GLUCOSE, CAPILLARY     Status: Abnormal   Collection Time    07/17/14  4:27 AM  Result Value Ref Range   Glucose-Capillary 169 (*) 70 - 99 mg/dL  CBC     Status: Abnormal   Collection Time    07/17/14  4:41 AM      Result Value Ref Range   WBC 6.4  4.0 - 10.5 K/uL   RBC 3.69 (*) 4.22 - 5.81 MIL/uL   Hemoglobin 11.0 (*) 13.0 - 17.0 g/dL   HCT 34.3 (*) 39.0 - 52.0 %   MCV 93.0  78.0 - 100.0 fL   MCH 29.8  26.0 - 34.0 pg   MCHC 32.1  30.0 - 36.0 g/dL   RDW 15.2  11.5 - 15.5 %   Platelets 229  150 - 400 K/uL   Comment: DELTA CHECK NOTED  BASIC METABOLIC PANEL     Status: Abnormal   Collection Time    07/17/14  4:41 AM      Result Value Ref Range   Sodium 147  137 - 147 mEq/L   Potassium 3.6 (*) 3.7 - 5.3 mEq/L   Chloride 99  96 - 112 mEq/L   CO2 35 (*) 19 - 32 mEq/L   Glucose, Bld 175 (*) 70 - 99 mg/dL   BUN 56 (*) 6 - 23 mg/dL   Creatinine, Ser 1.20  0.50 - 1.35 mg/dL   Calcium 8.7  8.4 - 10.5 mg/dL   GFR calc non Af Amer 60 (*) >90 mL/min   GFR calc Af Amer 69 (*) >90 mL/min   Comment: (NOTE)     The eGFR has been calculated using the CKD EPI equation.     This calculation has not been validated in all clinical situations.     eGFR's persistently <90 mL/min signify possible Chronic Kidney     Disease.   Anion gap 13  5 - 15  MAGNESIUM     Status: None   Collection Time     07/17/14  4:41 AM      Result Value Ref Range   Magnesium 2.1  1.5 - 2.5 mg/dL  GLUCOSE, CAPILLARY     Status: Abnormal   Collection Time    07/17/14  7:25 AM      Result Value Ref Range   Glucose-Capillary 188 (*) 70 - 99 mg/dL  GLUCOSE, CAPILLARY     Status: Abnormal   Collection Time    07/17/14 11:09 AM      Result Value Ref Range   Glucose-Capillary 203 (*) 70 - 99 mg/dL  GLUCOSE, CAPILLARY     Status: Abnormal   Collection Time    07/17/14  3:45 PM      Result Value Ref Range   Glucose-Capillary 183 (*) 70 - 99 mg/dL  GLUCOSE, CAPILLARY     Status: Abnormal   Collection Time    07/17/14  9:26 PM      Result Value Ref Range   Glucose-Capillary 255 (*) 70 - 99 mg/dL  CBC     Status: Abnormal   Collection Time    07/18/14  5:00 AM      Result Value Ref Range   WBC 6.5  4.0 - 10.5 K/uL   RBC 3.90 (*) 4.22 - 5.81 MIL/uL   Hemoglobin 11.8 (*) 13.0 - 17.0 g/dL   HCT 37.6 (*) 39.0 - 52.0 %   MCV 96.4  78.0 - 100.0 fL   MCH 30.3  26.0 - 34.0 pg   MCHC 31.4  30.0 - 36.0 g/dL   RDW 15.3  11.5 - 15.5 %     Platelets 288  150 - 400 K/uL  BASIC METABOLIC PANEL     Status: Abnormal   Collection Time    07/18/14  5:00 AM      Result Value Ref Range   Sodium 146  137 - 147 mEq/L   Potassium 3.8  3.7 - 5.3 mEq/L   Chloride 99  96 - 112 mEq/L   CO2 35 (*) 19 - 32 mEq/L   Glucose, Bld 216 (*) 70 - 99 mg/dL   BUN 58 (*) 6 - 23 mg/dL   Creatinine, Ser 1.03  0.50 - 1.35 mg/dL   Calcium 9.2  8.4 - 10.5 mg/dL   GFR calc non Af Amer 72 (*) >90 mL/min   GFR calc Af Amer 84 (*) >90 mL/min   Comment: (NOTE)     The eGFR has been calculated using the CKD EPI equation.     This calculation has not been validated in all clinical situations.     eGFR's persistently <90 mL/min signify possible Chronic Kidney     Disease.   Anion gap 12  5 - 15  MAGNESIUM     Status: Abnormal   Collection Time    07/18/14  5:00 AM      Result Value Ref Range   Magnesium 2.7 (*) 1.5 - 2.5 mg/dL   TRIGLYCERIDES     Status: None   Collection Time    07/18/14  5:00 AM      Result Value Ref Range   Triglycerides 145  <150 mg/dL  GLUCOSE, CAPILLARY     Status: Abnormal   Collection Time    07/18/14  7:49 AM      Result Value Ref Range   Glucose-Capillary 198 (*) 70 - 99 mg/dL  GLUCOSE, CAPILLARY     Status: Abnormal   Collection Time    07/18/14 11:27 AM      Result Value Ref Range   Glucose-Capillary 206 (*) 70 - 99 mg/dL   No results found.     Medical Problem List and Plan: 1. Functional deficits secondary to Cervical myelopathy with deconditioning after pneumonia requiring intubation 2.  DVT Prophylaxis/Anticoagulation: Pharmaceutical: Lovenox 3. Chronic Pain Management: Off Volatren and NSAIDS. Monitor mental status with addition of Skelaxin as well as OxyContin in the last 24 hours.   4. Mood:  Monitor as mentation improves. Will have LCSW follow for evaluation and support.  5. Neuropsych: This patient is capable of making decisions on his own behalf. 6. 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  8. HTN: Continue metoprolol and lasix. Cards recommendations: if needed to add norvasc and resume ACE if renal status stable.  9. Acute on chronic diastolic CHF: Daily weights. Low salt diet. Patient non compliant at home--will have RD educate on appropriate diet.  10. Hypotension with AKI: resolved. Will recheck labs in am.  11. BPH: foley d/c yesterday. Off cardura and ditropan at this time. Monitor voiding.  12. DM type 2 diet controlled: Not controlled on diet alone and patient with non compliance--Hgb A1c-7.0. BS poorly controlled due to stress/infection requiring lantus. Will monitor BS with ac/hs checks and start patient on oral agent. Will wean off lantus and titrate medication as indicated.   13. Insomnia with RLS: Continue Mirapex. Monitor sleep wake cycle.  14.  Constipation: will start bowel program.   Post Admission Physician Evaluation: 1. Functional deficits secondary  to Cervical myelopathy  with deconditioning after pneumonia requiring intubation 1.  2. Patient is admitted to receive collaborative, interdisciplinary care between the physiatrist, rehab nursing staff, and therapy team. 3. Patient's level of medical complexity and substantial therapy needs in context of that medical necessity cannot be provided at a lesser intensity of care such as a SNF. 4. Patient has experienced substantial functional loss from his/her baseline which was documented above under the "Functional History" and "Functional Status" headings.  Judging by the patient's diagnosis, physical exam, and functional history, the patient has potential for functional progress which will result in measurable gains while on inpatient rehab.  These gains will be of substantial and practical use upon discharge  in facilitating mobility and self-care at the household level. 5. Physiatrist will provide 24 hour management of medical needs as well as oversight of the therapy plan/treatment and provide guidance as appropriate regarding the interaction of the two. 6. 24 hour rehab nursing will assist with bladder management, bowel management, safety, skin/wound care, disease management, medication administration, pain management and patient education  and help integrate therapy concepts, techniques,education, etc. 7. PT will assess and treat for/with: Lower extremity strength, range of motion, stamina, balance, functional mobility, safety, adaptive techniques and equipment, pain mgt, NMR, family education, egosupport.   Goals are: supervision to min assist. 8. OT will assess and treat for/with: ADL's, functional mobility, safety, upper extremity strength, adaptive techniques and equipment, back pain, NMR, leisure awareness, community reintegration.   Goals are: supervision to min assist. 9. SLP will  assess and treat for/with: n/a.  Goals are: n/a. 10. Case Management and Social Worker will assess and treat for psychological issues and discharge planning. 11. Team conference will be held weekly to assess progress toward goals and to determine barriers to discharge. 12. Patient will receive at least 3 hours of therapy per day at least 5 days per week. 13. ELOS: 13-18 days       14. Prognosis:  good     Nozomi Mettler T. Jovonta Levit, MD, FAAPMR [facility]   07/18/2014 

## 2014-07-18 NOTE — Interval H&P Note (Signed)
Travis [name] was admitted today to Inpatient Rehabilitation with the diagnosis of deconditioning, hx of cervical myelopathy.  The patient's history has been reviewed, patient examined, and there is no change in status.  Patient continues to be appropriate for intensive inpatient rehabilitation.  I have reviewed the patient's chart and labs.  Questions were answered to the patient's satisfaction.  Travis [name] 07/18/2014, 9:46 PM

## 2014-07-18 NOTE — Progress Notes (Signed)
[facility]port called to receiving RN Cyril Mourning [phone]. Will transfer patient in bed. Patient with no complaints at the current time.

## 2014-07-18 NOTE — Discharge Summary (Signed)
Travis Longo, MD Pulmonary and Critical Care Medicine [facility] Pager: [phone]  

## 2014-07-19 ENCOUNTER — Inpatient Hospital Stay (HOSPITAL_COMMUNITY): Payer: Medicare Other | Admitting: Physical Therapy

## 2014-07-19 ENCOUNTER — Inpatient Hospital Stay (HOSPITAL_COMMUNITY): Payer: Medicare Other | Admitting: Occupational Therapy

## 2014-07-19 DIAGNOSIS — A419 Sepsis, unspecified organism: Secondary | ICD-10-CM

## 2014-07-19 DIAGNOSIS — M4712 Other spondylosis with myelopathy, cervical region: Secondary | ICD-10-CM

## 2014-07-19 DIAGNOSIS — I5031 Acute diastolic (congestive) heart failure: Secondary | ICD-10-CM

## 2014-07-19 DIAGNOSIS — Z5189 Encounter for other specified aftercare: Secondary | ICD-10-CM

## 2014-07-19 DIAGNOSIS — R5381 Other malaise: Secondary | ICD-10-CM

## 2014-07-19 DIAGNOSIS — J189 Pneumonia, unspecified organism: Secondary | ICD-10-CM

## 2014-07-19 LAB — URINE MICROSCOPIC-ADD ON

## 2014-07-19 LAB — CBC WITH DIFFERENTIAL/PLATELET
Basophils Absolute: 0.1 10*3/uL (ref 0.0–0.1)
Basophils Relative: 1 % (ref 0–1)
Eosinophils Absolute: 0.2 10*3/uL (ref 0.0–0.7)
Eosinophils Relative: 3 % (ref 0–5)
HCT: 39.8 % (ref 39.0–52.0)
HEMOGLOBIN: 12.7 g/dL — AB (ref 13.0–17.0)
Lymphocytes Relative: 15 % (ref 12–46)
Lymphs Abs: 1.1 10*3/uL (ref 0.7–4.0)
MCH: 30.2 pg (ref 26.0–34.0)
MCHC: 31.9 g/dL (ref 30.0–36.0)
MCV: 94.5 fL (ref 78.0–100.0)
MONOS PCT: 8 % (ref 3–12)
Monocytes Absolute: 0.6 10*3/uL (ref 0.1–1.0)
NEUTROS ABS: 5.4 10*3/uL (ref 1.7–7.7)
Neutrophils Relative %: 73 % (ref 43–77)
Platelets: 316 10*3/uL (ref 150–400)
RBC: 4.21 MIL/uL — AB (ref 4.22–5.81)
RDW: 14.9 % (ref 11.5–15.5)
WBC: 7.3 10*3/uL (ref 4.0–10.5)

## 2014-07-19 LAB — URINALYSIS, ROUTINE W REFLEX MICROSCOPIC
Bilirubin Urine: NEGATIVE
GLUCOSE, UA: NEGATIVE mg/dL
KETONES UR: NEGATIVE mg/dL
Nitrite: NEGATIVE
PH: 6 (ref 5.0–8.0)
Protein, ur: 30 mg/dL — AB
Specific Gravity, Urine: 1.019 (ref 1.005–1.030)
Urobilinogen, UA: 1 mg/dL (ref 0.0–1.0)

## 2014-07-19 LAB — GLUCOSE, CAPILLARY
GLUCOSE-CAPILLARY: 199 mg/dL — AB (ref 70–99)
GLUCOSE-CAPILLARY: 153 mg/dL — AB (ref 70–99)
GLUCOSE-CAPILLARY: 132 mg/dL — AB (ref 70–99)
Glucose-Capillary: 137 mg/dL — ABNORMAL HIGH (ref 70–99)

## 2014-07-19 LAB — COMPREHENSIVE METABOLIC PANEL
ALT: 73 U/L — ABNORMAL HIGH (ref 0–53)
ANION GAP: 13 (ref 5–15)
AST: 44 U/L — AB (ref 0–37)
Albumin: 3.3 g/dL — ABNORMAL LOW (ref 3.5–5.2)
Alkaline Phosphatase: 82 U/L (ref 39–117)
BILIRUBIN TOTAL: 0.7 mg/dL (ref 0.3–1.2)
BUN: 52 mg/dL — AB (ref 6–23)
CO2: 34 mEq/L — ABNORMAL HIGH (ref 19–32)
Calcium: 9.5 mg/dL (ref 8.4–10.5)
Chloride: 101 mEq/L (ref 96–112)
Creatinine, Ser: 0.96 mg/dL (ref 0.50–1.35)
GFR calc Af Amer: >90 mL/min (ref >90)
GFR calc non Af Amer: 83 mL/min — ABNORMAL LOW (ref >90)
Glucose, Bld: 147 mg/dL — ABNORMAL HIGH (ref 70–99)
POTASSIUM: 3.9 meq/L (ref 3.7–5.3)
Sodium: 148 mEq/L — ABNORMAL HIGH (ref 137–147)
Total Protein: 7.9 g/dL (ref 6.0–8.3)

## 2014-07-19 MED ORDER — INSULIN GLARGINE 100 UNIT/ML ~~LOC~~ SOLN
20.0000 [IU] | Freq: Every day | SUBCUTANEOUS | Status: DC
  Administered 2014-07-19 – 2014-07-23 (×5): 20 [IU] via SUBCUTANEOUS
  Filled 2014-07-19 (×6): qty 0.2

## 2014-07-19 MED ORDER — AMLODIPINE BESYLATE 5 MG PO TABS
5.0000 mg | ORAL_TABLET | Freq: Every day | ORAL | Status: DC
  Administered 2014-07-19 – 2014-08-09 (×22): 5 mg via ORAL
  Filled 2014-07-19 (×24): qty 1

## 2014-07-19 NOTE — Progress Notes (Signed)
[place] PHYSICAL MEDICINE & REHABILITATION     PROGRESS NOTE    Subjective/Complaints: Had a fair night. Denies new pain. No sob, cough.   Objective: Vital Signs: Blood pressure 168/71, pulse 90, temperature 97.7 F (36.5 C), temperature source Oral, resp. rate 18, height 6\' 1"  (1.854 m), weight 119.4 kg (263 lb 3.7 oz), SpO2 95.00%. No results found.  Recent Labs  07/18/14 0500 07/19/14 0526  WBC 6.5 7.3  HGB 11.8* 12.7*  HCT 37.6* 39.8  PLT 288 316    Recent Labs  07/18/14 0500 07/19/14 0526  NA 146 148*  K 3.8 3.9  CL 99 101  GLUCOSE 216* 147*  BUN 58* 52*  CREATININE 1.03 0.96  CALCIUM 9.2 9.5   CBG (last 3)   Recent Labs  07/18/14 1629 07/18/14 2111 07/19/14 0726  GLUCAP 211* 213* 199*    Wt Readings from Last 3 Encounters:  07/19/14 119.4 kg (263 lb 3.7 oz)  07/18/14 119.8 kg (264 lb 1.8 oz)  03/13/14 131.09 kg (289 lb)    Physical Exam:  Constitutional: He is oriented to person, place, and time. He appears well-developed and well-nourished. Side lying in bed  HENT: dentition fair. Oral mucosa pink/moist  Head: Normocephalic and atraumatic.  Eyes: Conjunctivae are normal. Pupils are equal, round, and reactive to light.  Neck: Normal range of motion. Neck supple.  Cardiovascular: Normal rate and regular rhythm. No murmurs  Respiratory: Effort normal and breath sounds normal. No respiratory distress. He has wheezes in upper airway.  GI: Soft. Bowel sounds are normal. He exhibits no distension. There is no tenderness.  Musculoskeletal: He exhibits trace edema in both legs.. Has low back pain with minimal lower extremity movement and with bed mobility Neurological: He is alert and oriented to person, place, and time.  Speech slow. Tremors BUE. Able to follow basic commands without difficulty. Dysesthesias bilateral feet.  CN exam normal.  extremity: Intrinsic atrophy bilateral hands.  Motor strength is 4 minus bilateral grip 5 bilateral biceps  and deltoid, 4/5 left triceps 4/5 right tricep, wrist extension 4+  2 minus hip flexion knee extension ankle dorsiflexion and plantarflexion  Sensation intact to light touch in bilateral upper and lower extremities except for decreased proprioception in bilateral feet.  Deep tendon reflexes 2+ bilateral knees trace bilateral biceps and brachioradialis and triceps, 1+ patella,achilles  Ankles without clonus  Skin: chronic changes on feet/ dry, scaly skin, small reddened area left buttock, small stage 2 right buttock. Back surgery site intact  Psych: he's withdrawn, flat. Non-anxious on my eval   Assessment/Plan: 1. Functional deficits secondary to deconditioning, hx of cervical myelopathy which require 3+ hours per day of interdisciplinary therapy in a comprehensive inpatient rehab setting. Physiatrist is providing close team supervision and 24 hour management of active medical problems listed below. Physiatrist and rehab team continue to assess barriers to discharge/monitor patient progress toward functional and medical goals. FIM:                   Comprehension Comprehension Mode: Auditory Comprehension: 4-Understands basic 75 - 89% of the time/requires cueing 10 - 24% of the time  Expression Expression Mode: Verbal Expression: 5-Expresses basic 90% of the time/requires cueing < 10% of the time.  Social Interaction Social Interaction: 4-Interacts appropriately 75 - 89% of the time - Needs redirection for appropriate language or to initiate interaction.  Problem Solving Problem Solving: 3-Solves basic 50 - 74% of the time/requires cueing 25 - 49% of the time  Memory Memory Mode: Not assessed Memory: 3-Recognizes or recalls 50 - 74% of the time/requires cueing 25 - 49% of the time  Medical Problem List and Plan:  1. Functional deficits secondary to Cervical myelopathy with deconditioning after pneumonia requiring intubation  2. DVT Prophylaxis/Anticoagulation:  Pharmaceutical: Lovenox  3. Chronic Pain Management: Off Volatren and NSAIDS. Monitor mental status with addition of Skelaxin as well as OxyContin in the last 24 hours.  4. Mood: Monitor as mentation improves. Will have LCSW follow for evaluation and support.  5. Neuropsych: This patient is capable of making decisions on his own behalf.  6. 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  8. HTN: Continue metoprolol and lasix. Cards recommendations: if needed to add norvasc and resume ACE if renal status stable.   -will add 5mg  norvasc today 9. Acute on chronic diastolic CHF: Daily weights. Low salt diet. Patient non compliant at home--will have RD educate on appropriate diet.  10. Hypotension with AKI: BUN holding around 50 11. BPH: foley d/c yesterday. Off cardura and ditropan at this time. Monitor voiding.  12. DM type 2 diet controlled: Not controlled on diet alone and patient with non compliance--Hgb A1c-7.0. BS poorly controlled due to stress/infection requiring lantus. Will monitor BS with ac/hs checks and start patient on oral agent. Will continue lantus for now until cbg's controlled better.  13. Insomnia with RLS: Continue Mirapex. Monitor sleep wake cycle.  14. Constipation:  bowel program.  LOS (Days) 1 A FACE TO FACE EVALUATION WAS PERFORMED  SWARTZ,ZACHARY T 07/19/2014 8:28 AM

## 2014-07-19 NOTE — Progress Notes (Signed)
Initiated flutter with patient. Explained and demonstrated the use of device. Patient has good effort X 10.

## 2014-07-19 NOTE — Progress Notes (Signed)
Occupational Therapy Session Note  Patient Details  Name: Travis [name] MRN: [id number] Date of Birth: February 06, 1946  Today's Date: 07/19/2014 Time: [id number] Time Calculation (min): 33 min  Short Term Goals: Week 1:  OT Short Term Goal 1 (Week 1): Pt. will perform bathing , seated and standing, with Max A  in order to increase I in self care.  OT Short Term Goal 2 (Week 1): Pt. will engage in 10 minutes of functional activity without requiring rest break in order to increase endurance for occupational performance. OT Short Term Goal 3 (Week 1): Pt. will perform LB dressing with Max A in order to increase I with ADLs.  OT Short Term Goal 4 (Week 1): Pt. will perform supine to sit with Max A in order to  increase I  in bed mobility. OT Short Term Goal 5 (Week 1): Pt. will perform static sitting balance at EOB with feet supported and supervision for 2 or more minutes in order to increase balance for occupational performance.   Skilled Therapeutic Interventions/Progress Updates:  Upon arrival to room, patients spouse and family member present in room. Pt. EOB and attempting to get into wheelchair with caregiver assistance. Therapist discussed with pt and family members safety and fall precautions regarding patient transfers. Pt currently requiring +2 for transfers and safety plan revised to reflect these changes as well as the use of quick release safety belt for pt safety. Caregivers and family verbalized understanding. OT provided facilitation and verbal cues for weight shifting and movement of hips further back onto bed. OT educated and demonstrated use and precautions regarding sliding board transfer for bed to wheelchair. Pt performed transfer to tilt in space wheelchair with sliding board and Mod A of 2 for physical assistance and verbal cues. OT assisted pt into [name] in order to facilitate neutral pelvic position and posture with manual facilitation and verbal cues. Pt attempted to come into  full standing from partially squat/standing position but was unable to without physical assist from therapist or use of Clarise Cruz. Upon exiting the room, pt seated in wheelchair with slight backwards tilt applied, quick release belt on and caregiver present in room.   Therapy Documentation Precautions:  Precautions Precautions: Fall Precaution Comments: chronic back pain - performs bending with no B,L,T and log rolls Restrictions Weight Bearing Restrictions: No Pain: Pt with no c/o pain during session.   See FIM for current functional status  Therapy/Group: Individual Therapy  [name] 07/19/2014, 2:33 PM

## 2014-07-19 NOTE — Progress Notes (Signed)
Charlett Blake, MD Physician Signed Physical Medicine and Rehabilitation Consult Note Service date: 07/17/2014 3:17 PM  Related encounter: Admission (Discharged) from 07/10/2014 in [facility]           Physical Medicine and Rehabilitation Consult   Reason for Consult: Deconditioning Referring Physician: Dr. Oliver Pila     HPI: Travis [name] is a 68 y.o. 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  CXR with evidence of pneumonia and patient with progressive respiratory failure requiring intubation past admission. He was started on IV antibiotics for sepsis as well as IVF bolus for hypotension. Hospital course complicated by AKI due to ATN, agitation as well as intermittent fevers. He completed antibiotics on 07/19 and tolerated extubation. Renal status slowly improving with increase in UOP.  Has had SOB that has improved with diuresis. 2D echo with normal LVF. Cardiology consulted for input on CHF and recommended continuing IV diuresis with question of acute on chronic CHF.  PT/OT evaluations done today and CIR recommended due to deconditioned state.    Reviewed neurology note from March of 2015. At that time his upper extremity strength is rated as normal and lower extremity as 4/5. He did have wide based gait. He did use a cane. Suggestions for for bilateral AFOs. Review of Systems  HENT: Negative for hearing loss.   Eyes: Negative for blurred vision and double vision.  Respiratory: Positive for shortness of breath (improving). Negative for cough and wheezing.   Cardiovascular: Negative for chest pain and palpitations.  Gastrointestinal: Positive for constipation. Negative for heartburn and nausea.  Genitourinary: Positive for urgency and frequency.  Musculoskeletal: Positive for back pain, myalgias and neck pain.         Impaired balance.   Neurological: Positive for sensory change (BLE) and weakness. Negative for headaches.  Psychiatric/Behavioral: The patient is nervous/anxious.      Past Medical History   Diagnosis  Date   .  PONV (postoperative nausea and vomiting)     .  Hypertension     .  DJD (degenerative joint disease)         WHOLE SPINE   .  Hyperthyroidism         Had Iodine to resolve issue DX 10 YR AGO   .  Hypothyroidism         Low d/t treatment of hyperthyroidism   .  Sleep apnea         DX  2009/2010 study done at [facility]   .  Type II diabetes mellitus         diet controlled   .  Heart murmur         Evaluated by cardiologist 4-5 years ago but does not currently see a cardiologist   .  Gait disorder     .  Cervical myelopathy         with myelomalacia at C5-6   .  RLS (restless legs syndrome)     .  Peripheral neuropathy     .  Lumbosacral spinal stenosis  03/13/2014   .  Constipation - functional         controlled with miralax   .  RBBB         Past Surgical History   Procedure  Laterality  Date   .  Cholecystectomy       .  Back surgery           6 surgeries   .  Lumbar laminectomy/decompression microdiscectomy    07/14/2012       Procedure: LUMBAR LAMINECTOMY/DECOMPRESSION MICRODISCECTOMY 2 LEVELS;  Surgeon: Eustace Moore, MD;  Location: [facility];  Service: Neurosurgery;  Laterality: Left;  Lumbar two three laminectomy, left thoracic four five transpedicular diskectomy       Family History   Problem  Relation  Age of Onset   .  Polycythemia  Mother     .  Diabetes  Father     .  Hypertension  Father     .  Heart disease  Father     .  CAD           1 of his 4 brothers and father has CAD      Social History:  Married. Independent PTA--uses walker in the home and cane out of home setting. 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 He  reports that he quit smoking about 29 years ago. His smoking use included Cigarettes. He has a 40 pack-year smoking history. He quit  smokeless tobacco use about 29 years ago. He reports that he does not drink alcohol or use illicit drugs.      Allergies   Allergen  Reactions   .  Other  Other (See Comments)       Anesthesia makes sick, must have antinausea medication in advance.    Medications Prior to Admission   Medication  Sig  Dispense  Refill   .  ciclopirox (PENLAC) 8 % solution  Apply 1 application topically at bedtime as needed (dry legs).          .  diclofenac (VOLTAREN) 75 MG EC tablet  Take 75 mg by mouth daily after lunch. About 4pm         .  diphenhydrAMINE (BENADRYL) 25 MG tablet  Take 25 mg by mouth at bedtime as needed for sleep.         [name]  doxazosin (CARDURA) 4 MG tablet  Take 4 mg by mouth at bedtime.         .  gabapentin (NEURONTIN) 600 MG tablet  Take 600 mg by mouth 3 (three) times daily.         [name]  ibuprofen (ADVIL,MOTRIN) 200 MG tablet  Take 400 mg by mouth at bedtime as needed for moderate pain.         [name]  levothyroxine (SYNTHROID, LEVOTHROID) 150 MCG tablet  Take 150 mcg by mouth daily.         [name]  lisinopril-hydrochlorothiazide (PRINZIDE,ZESTORETIC) 20-12.5 MG per tablet  Take 1 tablet by mouth every evening.          .  metoprolol succinate (TOPROL-XL) 25 MG 24 hr tablet  Take 25 mg by mouth daily.          [name]  oxybutynin (DITROPAN) 5 MG tablet  Take 5 mg by mouth daily as needed for bladder spasms.         .  pramipexole (MIRAPEX) 0.125 MG tablet  Take 0.25 mg by mouth 2 (two) times daily.             Home: Home Living Family/patient expects to be discharged to:: Private residence Living Arrangements: Spouse/significant other Available Help at Discharge: Family;Available 24 hours/day Type of Home: House Home Access: Stairs to enter [employer] of Steps: 2 Entrance Stairs-Rails: None Home Layout: One level Home Equipment:  Cane - single point;Walker - 2 wheels (walker is 70 years old)   Functional History: Prior Function Level of Independence: Needs assistance Gait /  Transfers Assistance Needed: Walked with the cane all the time   ADL's / Homemaking Assistance Needed: Independent with bathing/dressing. Could stand in the shower to wash. Wife occasionally helps with socks and shoes Functional Status:   Mobility: Bed Mobility Overal bed mobility: +2 for physical assistance;Needs Assistance Bed Mobility: Rolling;Sidelying to Sit Rolling: Mod assist;+2 for physical assistance Sidelying to sit: Max assist;+2 for physical assistance General bed mobility comments: VC's for sequencing and technique. Instructed in log roll as pt complaining of back pain/spasms. Ongoing back pain issues at baseline. Max +2 for trunk elevation to full sitting position.  Transfers Overall transfer level: Needs assistance Equipment used:  [name]) Transfers: Stand Pivot Transfers Stand pivot transfers: Total assist;+2 safety/equipment;+2 physical assistance General transfer comment: Used Sara Plus lift to transition from bed to recliner. Pt required support of the lift as well as under hips with bed pad to power-up into standing and maintain extended hips/knees. Tactile cueing for back extension as pt resting chest and chin on front tray of Sara Plus at times.  Ambulation/Gait General Gait Details: Unable at this time   ADL:   Cognition: Cognition Overall Cognitive Status: Within Functional Limits for tasks assessed Orientation Level: Oriented X4 Cognition Arousal/Alertness: Lethargic Behavior During Therapy: Flat affect Overall Cognitive Status: Within Functional Limits for tasks assessed   Blood pressure 144/52, pulse 84, temperature 98.1 F (36.7 C), temperature source Oral, resp. rate 20, height 6\' 1"  (1.854 m), weight 121.9 kg (268 lb 11.9 oz), SpO2 94.00%. Physical Exam  Nursing note and vitals reviewed. Constitutional: He is oriented to person, place, and time. He appears well-developed and well-nourished.  HENT:   Head: Normocephalic and atraumatic.  Eyes:  Conjunctivae are normal. Pupils are equal, round, and reactive to light.  Neck: Normal range of motion. Neck supple.  Cardiovascular: Normal rate and regular rhythm.   Respiratory: Effort normal and breath sounds normal. No respiratory distress. He has no wheezes.  GI: Soft. Bowel sounds are normal. He exhibits no distension. There is no tenderness.  Musculoskeletal: He exhibits no edema.  Neurological: He is alert and oriented to person, place, and time.  Speech slow. Tremors BUE.  Able to follow basic commands without difficulty. Dysesthesias bilateral feet.   Skin: Skin is warm and dry.  extremity: Intrinsic atrophy bilateral hands. Motor strength is 4 minus bilateral grip 5 bilateral biceps and deltoid 4/5 left triceps 5/5 right tricep 2 minus hip flexion knee extension ankle dorsiflexion and plantarflexion Sensation intact to light touch in bilateral upper and lower extremities. Deep tendon reflexes 2+ bilateral knees trace bilateral biceps and brachioradialis and triceps Ankles without clonus    Results for orders placed during the hospital encounter of 07/10/14 (from the past 24 hour(s))   GLUCOSE, CAPILLARY     Status: Abnormal     Collection Time      07/16/14  4:37 PM       Result  Value  Ref Range     Glucose-Capillary  185 (*)  70 - 99 mg/dL   GLUCOSE, CAPILLARY     Status: Abnormal     Collection Time      07/16/14  7:31 PM       Result  Value  Ref Range     Glucose-Capillary  212 (*)  70 - 99 mg/dL   GLUCOSE, CAPILLARY  Status: Abnormal     Collection Time      07/16/14 11:35 PM       Result  Value  Ref Range     Glucose-Capillary  195 (*)  70 - 99 mg/dL   GLUCOSE, CAPILLARY     Status: Abnormal     Collection Time      07/17/14  4:27 AM       Result  Value  Ref Range     Glucose-Capillary  169 (*)  70 - 99 mg/dL   CBC     Status: Abnormal     Collection Time      07/17/14  4:41 AM       Result  Value  Ref Range     WBC  6.4   4.0 - 10.5 K/uL     RBC  3.69  (*)  4.22 - 5.81 MIL/uL     Hemoglobin  11.0 (*)  13.0 - 17.0 g/dL     HCT  34.3 (*)  39.0 - 52.0 %     MCV  93.0   78.0 - 100.0 fL     MCH  29.8   26.0 - 34.0 pg     MCHC  32.1   30.0 - 36.0 g/dL     RDW  15.2   11.5 - 15.5 %     Platelets  229   150 - 400 K/uL   BASIC METABOLIC PANEL     Status: Abnormal     Collection Time      07/17/14  4:41 AM       Result  Value  Ref Range     Sodium  147   137 - 147 mEq/L     Potassium  3.6 (*)  3.7 - 5.3 mEq/L     Chloride  99   96 - 112 mEq/L     CO2  35 (*)  19 - 32 mEq/L     Glucose, Bld  175 (*)  70 - 99 mg/dL     BUN  56 (*)  6 - 23 mg/dL     Creatinine, Ser  1.20   0.50 - 1.35 mg/dL     Calcium  8.7   8.4 - 10.5 mg/dL     GFR calc non Af Amer  60 (*)  >90 mL/min     GFR calc Af Amer  69 (*)  >90 mL/min     Anion gap  13   5 - 15   MAGNESIUM     Status: None     Collection Time      07/17/14  4:41 AM       Result  Value  Ref Range     Magnesium  2.1   1.5 - 2.5 mg/dL   GLUCOSE, CAPILLARY     Status: Abnormal     Collection Time      07/17/14  7:25 AM       Result  Value  Ref Range     Glucose-Capillary  188 (*)  70 - 99 mg/dL   GLUCOSE, CAPILLARY     Status: Abnormal     Collection Time      07/17/14 11:09 AM       Result  Value  Ref Range     Glucose-Capillary  203 (*)  70 - 99 mg/dL    No results found.   Assessment/Plan: Diagnosis: Cervical myelopathy with deconditioning after pneumonia requiring intubation Does the need  for close, 24 hr/day medical supervision in concert with the patient's rehab needs make it unreasonable for this patient to be served in a less intensive setting? Yes Co-Morbidities requiring supervision/potential complications: Hypertension, obstructive sleep apnea, neurogenic bowel and bladder Due to bladder management, bowel management, safety, skin/wound care, disease management, medication administration, pain management and patient education, does the patient require 24 hr/day rehab nursing?  Yes Does the patient require coordinated care of a physician, rehab nurse, PT (1-2 hrs/day, 5 days/week) and OT (1-2 hrs/day, 5 days/week) to address physical and functional deficits in the context of the above medical diagnosis(es)? Yes Addressing deficits in the following areas: balance, endurance, locomotion, strength, transferring, bowel/bladder control, bathing, dressing, feeding, grooming and toileting Can the patient actively participate in an intensive therapy program of at least 3 hrs of therapy per day at least 5 days per week? Yes The potential for patient to make measurable gains while on inpatient rehab is good Anticipated functional outcomes upon discharge from inpatient rehab are min assist  with PT, min assist with OT, n/a with SLP. Estimated rehab length of stay to reach the above functional goals is: 13-18 days Does the patient have adequate social supports to accommodate these discharge functional goals? Yes Anticipated D/C setting: Home Anticipated post D/C treatments: HH therapy Overall Rehab/Functional Prognosis: good   RECOMMENDATIONS: This patient's condition is appropriate for continued rehabilitative care in the following setting: CIR Patient has agreed to participate in recommended program. Potentially Note that insurance prior authorization may be required for reimbursement for recommended care.   Comment: Would recommend thoracic and lumbar MRI to evaluate lower extremity weakness. This was recommended by neurology in March but the patient did not follow through on this       07/17/2014    Revision History...     Date/Time User Action   07/17/2014 5:25 PM Charlett Blake, MD Sign   07/17/2014 4:07 PM Bary Leriche, [name] Share  View Details Report   Routing History...     Date/Time From To Method   07/17/2014 5:25 PM Charlett Blake, MD Charlett Blake, MD In Basket   07/17/2014 5:25 PM Charlett Blake, MD [name], MD Fax

## 2014-07-19 NOTE — Progress Notes (Signed)
Physical Therapy Session Note  Patient Details  Name: Travis [name] MRN: [id number] Date of Birth: 1946-08-27  Today's Date: 07/19/2014 Time: 1105-1150  Time Calculation (min): 45 min   Short Term Goals: Week 1:     Skilled Therapeutic Interventions/Progress Updates:  Session 1: W/C Management: PT instructs pt in w/c propulsion 50' x 2 reps with B UE req min A and verbal cues for steering. Pt req tot A for legrest, armrest, and brakes management.   Therapeutic Activity: PT instructs pt in sit to stand in // bars req max A x 2 person with PT blocking pt's R LE (though no buckling) and mirror feedback for upright posture. Pt req multiple verbal cues to lift head and tuck bottom and pt's "full" upright stance is still slightly crouched - mod A x 1 for static stand balance in // bars with B UE support.  PT instructs pt in slideboard transfer from w/c to bed req max A x 2 with verbal cues for hand placement and to lean trunk forward. Sit to supine transfer max A x 2.   PT exchanged pt's reclining w/c for a tilt in space w/c in order to provide better pressure relief when up in the w/c during the day. Pt tolerates initiation of w/c propulsion, is able to stand in // bars with significant assistance, but is unable to take a step at this time. Pt c/o nausea and RN immediately gave pt nausea meds. Pt participated in slideboard transfer, but has extremely low activity tolerance and fatigues extremely quickly. Pt will benefit from continued bed mobility training, slideboard transfer training, progressive sit to stand training with LRAD, w/c propulsion teaching, and sitting/standing balance.   Therapy Documentation Precautions:  Precautions Precautions: Fall Precaution Comments: chronic back pain - performs bending with no B,L,T and log rolls Restrictions Weight Bearing Restrictions: No Vitals: At rest, pt removed nasal cannula and PT notes he saturates at 91%.  After PT dons [place] on 2 L/min, pt  saturates at 95%.  With w/c propulsion x 50' on 2 L/min, pt desaturates to 86%, and recovers to >= 91% in 30 seconds of pursed lip breathing.   See FIM for current functional status  Therapy/Group: Individual Therapy  [name] M 07/19/2014, 11:11 AM

## 2014-07-19 NOTE — Evaluation (Signed)
Occupational Therapy Assessment and Plan  Patient Details  Name: TIMM BONENBERGER MRN: [id number] Date of Birth: December 19, 1946  OT Diagnosis: muscle weakness (generalized) Rehab Potential: Rehab Potential: Good (for stated goals) ELOS: 3 weeks   Today's Date: 07/19/2014 Time: 0263-7858 Time Calculation (min): 60 min  Problem List:  Patient Active Problem List   Diagnosis Date Noted  . Acute diastolic heart failure 85/01/7740  . Cervical arthritis with myelopathy 07/18/2014  . Constipation - functional   . CAP (community acquired pneumonia) 07/10/2014  . Sepsis 07/10/2014  . HTN (hypertension) 07/10/2014  . OSA on CPAP 07/10/2014  . UTI (lower urinary tract infection) 07/10/2014  . Edema leg 07/10/2014  . Acute respiratory failure with hypoxia 07/10/2014  . 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 Other vitamin B12 deficiency anemia 07/13/2013  . 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 Polyneuropathy in other diseases classified elsewhere 02/17/2013  . Other general symptoms(780.99) 02/17/2013  . Abnormality of gait 02/17/2013  . Cervical spondylosis with myelopathy 02/17/2013    Past Medical History:  Past Medical History  Diagnosis Date  . PONV (postoperative nausea and vomiting)   . Hypertension   . DJD (degenerative joint disease)     WHOLE SPINE  . Hyperthyroidism     Had Iodine to resolve issue DX 10 YR AGO  . Hypothyroidism     Low d/t treatment of hyperthyroidism  . Sleep apnea     DX  2009/2010 study done at [facility]  . Type II diabetes mellitus     diet controlled  . Heart murmur     Evaluated by cardiologist 4-5 years ago but does not currently see a cardiologist  . Gait disorder   . Cervical myelopathy     with myelomalacia at C5-6  . RLS (restless legs syndrome)   . Peripheral neuropathy   . 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 Constipation - functional     controlled with miralax  . RBBB    Past Surgical History:  Past  Surgical History  Procedure Laterality Date  . Cholecystectomy    . Back surgery      6 surgeries  . Lumbar laminectomy/decompression microdiscectomy  07/14/2012    Procedure: LUMBAR LAMINECTOMY/DECOMPRESSION MICRODISCECTOMY 2 LEVELS;  Surgeon: Eustace Moore, MD;  Location: [place] [facility];  Service: Neurosurgery;  Laterality: Left;  Lumbar two three laminectomy, left thoracic four five transpedicular diskectomy    Assessment & Plan Clinical Impression: Patient is a 68 y.o. 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 CXR with evidence of pneumonia and patient with progressive respiratory failure requiring intubation past admission. He was started on IV antibiotics for sepsis as well as IVF bolus for hypotension. Hospital course complicated by AKI due to ATN, agitation as well as intermittent fevers. Renal ultrasound normal without hydronephrosis. He completed antibiotics course was extubated on 07/19 without difficulty.Patient transferred to CIR on 07/18/2014 .    Patient currently requires min A - total A with basic self-care skills secondary to muscle weakness and acute pain, nervousness,, decreased cardiorespiratoy endurance and decreased oxygen support, decreased awareness, decreased safety awareness and decreased memory and decreased sitting balance, decreased standing balance, decreased postural control and difficulty maintaining precautions.  Prior to hospitalization, patient could complete ADLs with modified independent .  Patient will benefit from skilled intervention to increase independence with basic self-care skills  prior to discharge home with care partner.  Anticipate patient will require 24 hour supervision and follow up home health.  OT - End of Session Activity Tolerance: Tolerates < 10 min activity, no significant change in vital signs Endurance Deficit:  Yes Endurance Deficit Description: Pt. requiring multiple rest breaks during ADL session.  OT Assessment Rehab Potential: Good (for stated goals) Barriers to Discharge: Other (comment) Barriers to Discharge Comments: determine caregiver ability to physically assist patient in home environment OT Patient demonstrates impairments in the following area(s): Balance;Endurance;Pain;Other (Comment) (strength) OT Basic ADL's Functional Problem(s): Grooming;Bathing;Dressing;Toileting OT Transfers Functional Problem(s): Toilet;Tub/Shower OT Plan OT Frequency: 5 out of 7 days OT Duration/Estimated Length of Stay: 3 weeks OT Treatment/Interventions: Patient/family education;Self Care/advanced ADL retraining;Therapeutic Exercise;Functional mobility training;Pain management;Therapeutic Activities;UE/LE Strength taining/ROM OT Basic Self-Care Anticipated Outcome(s): min A OT Toileting Anticipated Outcome(s): Min A OT Bathroom Transfers Anticipated Outcome(s): Min A  OT Recommendation Patient destination: Home Follow Up Recommendations: 24 hour supervision/assistance;Home health OT Equipment Recommended: Tub/shower seat Equipment Details: Pt. reports owning SPC, RW, elevated toilet with 2 grab bars   Skilled Therapeutic Intervention OT evaluation initiated and completed. Pt educated on OT purpose, POC, and goals with pt accepting plan of care. Pt performed ADLs assisted by therapist from bed level. Supine to sit required min verbal cues and assistance of +2 to facilitate proper technique in order to decrease back pain. OT assisted pt in safe, seated EOB tolerance. Pt required manual facilitation,verbal cues, and physical assistance for squat pivot transfer to wheelchair. Pt seated at sinkside for UB and grooming tasks and requiring verbal cues and physical assistance for posture and forward leaning to obtain material needed at sinkside. Pt educated on energy conservation techniques and encouraged pursed lip  breathing. Pt required frequent rest breaks for decreased endurance during session.   OT Evaluation Precautions/Restrictions  Precautions Precautions: Fall Precaution Comments: chronic back pain - performs bending with no B,L,T and log rolls Restrictions Weight Bearing Restrictions: No General Chart Reviewed: Yes Vital Signs Therapy Vitals Pulse Rate: 80 BP: 158/81 mmHg Patient Position (if appropriate): Sitting Oxygen Therapy SpO2: 92 % O2 Device: Nasal cannula O2 Flow Rate (L/min): 2 L/min Pulse Oximetry Type: Intermittent Pain Pain Assessment Pain Assessment: 0-10 Pain Score: 9  Faces Pain Scale: Hurts even more Pain Type: Chronic pain Pain Location: Back Pain Orientation: Lower Pain Onset: With Activity Pain Intervention(s): Rest;Repositioned Multiple Pain Sites: No Home Living/Prior Functioning Home Living Available Help at Discharge: Family;Available 24 hours/day Type of Home: House (1 story - ranch style) Home Access: Stairs to enter Technical brewer of Steps: 2 Entrance Stairs-Rails: Right Home Layout: One level Additional Comments: Pt. reports having small corner seat- will need shower chair  Lives With: Spouse Prior Function Level of Independence: Requires assistive device for independence  Able to Take Stairs?: Yes Driving: Yes Vocation: Part time employment Vocation Requirements: desk job Leisure: Hobbies-no ADL ADL ADL Comments: see FIM Vision/Perception  Vision- History Baseline Vision/History: Wears glasses Wears Glasses: At all times Patient Visual Report: No change from baseline Vision- Assessment Vision Assessment?: No apparent visual deficits  Cognition Overall Cognitive Status: Within Functional Limits for tasks assessed Arousal/Alertness: Awake/alert Orientation Level: Oriented X4 Attention: Focused;Sustained;Selective Focused Attention: Appears intact Sustained Attention: Appears intact Sustained Attention Impairment:  Functional complex;Verbal complex Selective Attention: Appears intact Memory: Impaired Memory Impairment: Decreased recall of new information Decreased Short Term Memory: Verbal complex Awareness: Impaired Awareness Impairment: Anticipatory impairment Problem Solving: Impaired Problem Solving Impairment: Functional complex Executive Function: Initiating Initiating:  Impaired Initiating Impairment: Functional basic;Functional complex Safety/Judgment: Appears intact Comments: reports anxiousness Sensation Sensation Light Touch: Impaired Detail Light Touch Impaired Details: Impaired LLE;Impaired RLE Stereognosis: Not tested Hot/Cold: Not tested Proprioception: Impaired by gross assessment;Impaired Detail Proprioception Impaired Details: Impaired LLE;Impaired RLE Coordination Gross Motor Movements are Fluid and Coordinated: No Fine Motor Movements are Fluid and Coordinated: Yes Coordination and Movement Description: Pt. demonstrated ability [facility] for ADL tasks such as opening and closing items, picking up toothbrush Finger Nose Finger Test: WFLs Heel Shin Test: not tested due to muscle weakness Motor  Motor Motor: Abnormal postural alignment and control Motor - Skilled Clinical Observations: generalized weakness, significant weekness in B LEs, pain increased with movements Mobility  Bed Mobility Bed Mobility: Rolling Right;Rolling Left;Right Sidelying to Sit Rolling Right: 2: Max assist Rolling Right Details: Verbal cues for technique Rolling Left: 2: Max assist Rolling Left Details: Verbal cues for technique Right Sidelying to Sit: 1: +2 Total assist Right Sidelying to Sit: Patient Percentage: 10% Right Sidelying to Sit Details: Verbal cues for technique;Verbal cues for sequencing Sit to Sidelying Right: 1: +1 Total assist Sit to Sidelying Right Details: Verbal cues for technique;Verbal cues for sequencing;Manual facilitation for placement;Manual facilitation for weight  shifting  Trunk/Postural Assessment  Cervical Assessment Cervical Assessment: Exceptions to [facility] Cervical AROM Overall Cervical AROM: Deficits;Due to pain Overall Cervical AROM Comments: head in flexion, limited extension noted Thoracic Assessment Thoracic Assessment: Exceptions to [facility] Thoracic AROM Overall Thoracic AROM: Deficits;Due to pain Overall Thoracic AROM Comments: slouched in sitting Lumbar Assessment Lumbar Assessment: Exceptions to [facility] Lumbar AROM Overall Lumbar AROM: Deficits;Due to pain Overall Lumbar AROM Comments: posterior pelvic tilt, and lateral lean to the right Postural Control Postural Control: Deficits on evaluation Head Control: head held downwards - flexed Trunk Control: shoulder rounded,  Righting Reactions: slowed and limited due to difficulty weight shifting off of UE support Protective Responses: slowed and limited due to difficulty weight shifting off of UE support Postural Limitations: slouched, head held down  Balance Balance Balance Assessed: Yes Static Sitting Balance Static Sitting - Balance Support: Feet supported Static Sitting - Level of Assistance: 4: Min assist Dynamic Sitting Balance Dynamic Sitting - Balance Support: Feet supported;During functional activity;Right upper extremity supported;Left upper extremity supported Dynamic Sitting - Level of Assistance: 4: Min assist Sitting balance - Comments: reaching with min excursion to hold PT's hand for a second, then back on bed Extremity/Trunk Assessment RUE Assessment RUE Assessment: Within Functional Limits (4/5 throughout - decreased endurance for activity.) LUE Assessment LUE Assessment: Within Functional Limits (4/5 throughout UE - decreased endurance throughout)  FIM:  FIM - Grooming Grooming Steps: Wash, rinse, dry face;Wash, rinse, dry hands;Brush, comb hair;Shave or apply make-up Grooming: 4: Patient completes 3 of 4 or 4 of 5 steps (assist with shaving at sinkside) FIM -  Bathing Bathing Steps Patient Completed: Chest;Right Arm;Left Arm;Abdomen;Front perineal area Bathing: 1: Two helpers (Mod A for parts completed but required 2 helpers  for LB ) FIM - Upper Body Dressing/Undressing Upper body dressing/undressing steps patient completed: Thread/unthread right sleeve of pullover shirt/dresss;Thread/unthread left sleeve of pullover shirt/dress;Put head through opening of pull over shirt/dress Upper body dressing/undressing: 4: Min-Patient completed 75 plus % of tasks (Pt. needed to be in supported sitting to be successful - in wheelchair at sink,) FIM - Lower Body Dressing/Undressing Lower body dressing/undressing: 1: Two helpers (supine with rolling) FIM - Toileting Toileting: 1: Total-Patient completed zero steps, helper did all 3 FIM - Control and instrumentation engineer  Devices: HOB elevated Bed/Chair Transfer: 1: Supine > Sit: Total A (helper does all/Pt. < 25%);1: Two helpers FIM - Camera operator Transfers: 0-Activity did not occur or was simulated (not safe to perform at this time.)   Refer to Care Plan for Long Term Goals  Recommendations for other services: None  Discharge Criteria: Patient will be discharged from OT if patient refuses treatment 3 consecutive times without medical reason, if treatment goals not met, if there is a change in medical status, if patient makes no progress towards goals or if patient is discharged from hospital.  The above assessment, treatment plan, treatment alternatives and goals were discussed and mutually agreed upon: by patient  [name] 07/19/2014, 11:49 AM

## 2014-07-19 NOTE — Evaluation (Signed)
Physical Therapy Assessment and Plan  Patient Details  Name: Travis [name] MRN: [id number] Date of Birth: February 06, 1946  PT Diagnosis: Abnormal posture, Cognitive deficits, Coordination disorder, Difficulty walking, Edema, Impaired sensation, Low back pain, Muscle spasms and Muscle weakness Rehab Potential: Good ELOS: 14-17 days   Today's Date: 07/19/2014 Time: 3710-6269 Time Calculation (min): 60 min  Problem List:  Patient Active Problem List   Diagnosis Date Noted  . Acute diastolic heart failure 48/54/6270  . Cervical arthritis with myelopathy 07/18/2014  . Constipation - functional   . CAP (community acquired pneumonia) 07/10/2014  . Sepsis 07/10/2014  . HTN (hypertension) 07/10/2014  . OSA on CPAP 07/10/2014  . UTI (lower urinary tract infection) 07/10/2014  . Edema leg 07/10/2014  . Acute respiratory failure with hypoxia 07/10/2014  . 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 Other vitamin B12 deficiency anemia 07/13/2013  . 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 Polyneuropathy in other diseases classified elsewhere 02/17/2013  . Other general symptoms(780.99) 02/17/2013  . Abnormality of gait 02/17/2013  . Cervical spondylosis with myelopathy 02/17/2013    Past Medical History:  Past Medical History  Diagnosis Date  . PONV (postoperative nausea and vomiting)   . Hypertension   . DJD (degenerative joint disease)     WHOLE SPINE  . Hyperthyroidism     Had Iodine to resolve issue DX 10 YR AGO  . Hypothyroidism     Low d/t treatment of hyperthyroidism  . Sleep apnea     DX  2009/2010 [facility] done at [facility]  . Type II diabetes mellitus     diet controlled  . Heart murmur     Evaluated by cardiologist 4-5 years ago but does not currently see a cardiologist  . Gait disorder   . Cervical myelopathy     with myelomalacia at C5-6  . RLS (restless legs syndrome)   . Peripheral neuropathy   . 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  Constipation - functional     controlled with miralax  . RBBB    Past Surgical History:  Past Surgical History  Procedure Laterality Date  . Cholecystectomy    . Back surgery      6 surgeries  . Lumbar laminectomy/decompression microdiscectomy  07/14/2012    Procedure: LUMBAR LAMINECTOMY/DECOMPRESSION MICRODISCECTOMY 2 LEVELS;  Surgeon: Travis Moore, MD;  Location: [facility] [place] [facility];  Service: Neurosurgery;  Laterality: Left;  Lumbar two three laminectomy, left thoracic four five transpedicular diskectomy    Assessment & Plan Clinical Impression:  Travis [name] is a 68 y.o. 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 CXR with evidence of pneumonia and patient with progressive respiratory failure requiring intubation past admission. He was started on IV antibiotics for sepsis as well as IVF bolus for hypotension. Hospital course complicated by AKI due to ATN, agitation as well as intermittent fevers. Renal ultrasound normal without hydronephrosis. He completed antibiotics course was extubated on 07/19 without difficulty. Renal status slowly improving with increase in UOP. Has had SOB that has improved with diuresis. 2D echo with normal LVF. Cardiology consulted for input on CHF and recommended continuing IV diuresis with question of acute on chronic CHF with 4 L output and was changed to oral lasix bid today. Cards recommends monitoring for signs of AKI and may be able to decrease lasix to daily at discharge. Therapy  evaluations done yesterday and CIR recommended by rehab team. Agitation resolved therefore Risperdal d/c and chronic pain/anxiety medications resumed today. Patient transferred to CIR on 07/18/2014 .   Patient currently requires total with mobility secondary to muscle weakness, decreased cardiorespiratoy endurance and decreased oxygen support, impaired timing and  sequencing and decreased coordination, decreased initiation, decreased problem solving and decreased memory and decreased sitting balance, decreased standing balance, decreased postural control, decreased balance strategies and difficulty maintaining precautions.  Prior to hospitalization, patient was modified independent  with mobility and lived with Spouse in a House home.  Home access is 2Stairs to enter.  Patient will benefit from skilled PT intervention to maximize safe functional mobility, minimize fall risk and decrease caregiver burden for planned discharge home with 24 hour assist.  Anticipate patient will benefit from follow up [facility] at discharge.  PT - End of Session Activity Tolerance: Tolerates < 10 min activity with changes in vital signs (SaO2 drops) Endurance Deficit: Yes Endurance Deficit Description: Pt. requiring multiple rest breaks during ADL session.  PT Assessment Rehab Potential: Good Barriers to Discharge: Inaccessible home environment (2 stairs to enter) PT Patient demonstrates impairments in the following area(s): Balance;Edema;Endurance;Motor;Sensory;Safety;Pain PT Transfers Functional Problem(s): Bed Mobility;Bed to Chair;Car;Furniture PT Locomotion Functional Problem(s): Ambulation;Wheelchair Mobility;Stairs PT Plan PT Intensity: Minimum of 1-2 x/day ,45 to 90 minutes PT Frequency: 5 out of 7 days PT Duration Estimated Length of Stay: 21-24 days PT Treatment/Interventions: Ambulation/gait training;Balance/vestibular training;Community reintegration;Discharge planning;Cognitive remediation/compensation;DME/adaptive equipment instruction;Neuromuscular re-education;Functional mobility training;Pain management;Patient/family education;Psychosocial support;Stair training;Therapeutic Exercise;Therapeutic Activities;UE/LE Strength taining/ROM;UE/LE Coordination activities;Wheelchair propulsion/positioning PT Transfers Anticipated Outcome(s): Supervision  PT Locomotion  Anticipated Outcome(s): min A with RW short distances PT Recommendation Recommendations for Other Services: Other (comment) (none) Follow Up Recommendations: Home health PT;24 hour supervision/assistance Patient destination: Home Equipment Recommended: To be determined;Wheelchair cushion (measurements);Wheelchair (measurements) Equipment Details: Pt already owns a walker and a cane  Skilled Therapeutic Intervention PT Evaluation: Manual muscle testing, sensation testing, coordination assessment, functional mobility assessment complete - see below for details.   Therapeutic Exercise: PT instructs pt in heel slides and supine hip abduction/adduction x 5 reps to B LE A/AROM for strengthening.   Therapeutic Activity: PT instructs pt in rolling L and R in bed req max A from PT due to pain and weakness, R side lie to sit req tot A, initial sitting balance req mod A with significant support on B UEs progressing to CGA with PT verbal and manual facilitation for hand placement and weight shfiting; reaching R/L with one UE req min A for dynamic sit balance; tot A sit to R side lie to supine, with focus on not bending the spine.   Pt presents with significant limitations in all aspects of functional mobility, as well as low activity tolerance. Pt currently requires supplemental oxygen to maintain saturation >=90% with activity. Pt does demonstrate some cognitive deficits that may interfere with learning. Pt will benefit from skilled PT to maximize functional independence.     PT Evaluation Precautions/Restrictions Precautions Precautions: Fall;Back;Cervical Precaution Comments: spinal precautions Restrictions Weight Bearing Restrictions: No General Chart Reviewed: Yes Family/Caregiver Present: No Vital SignsTherapy Vitals Pulse Rate: 80 BP: 158/81 mmHg Patient Position (if appropriate): Sitting Oxygen Therapy SpO2: 92 % O2 Device: Nasal cannula O2 Flow Rate (L/min): 2 L/min Pulse Oximetry  Type: Intermittent Pain Pain Assessment Pain Assessment: 0-10 Pain Score: 8  Pain Type: Chronic pain Pain Location: Neck Pain Onset: On-going Pain Intervention(s): Rest;Repositioned Multiple Pain Sites: No Home Living/Prior Functioning Home Living Available Help  at Discharge: Family;Available 24 hours/day Type of Home: House Home Access: Stairs to enter [employer] of Steps: 2 Entrance Stairs-Rails: Right (via garage entrance) Home Layout: One level  Lives With: Spouse Prior Function Level of Independence: Requires assistive device for independence  Able to Take Stairs?: Yes Driving: Yes Vocation: Part time employment Vocation Requirements: desk job Leisure: Hobbies-no     Cognition Overall Cognitive Status: Within Functional Limits for tasks assessed Arousal/Alertness: Lethargic Orientation Level: Oriented X4 Attention: Focused;Sustained Focused Attention: Appears intact Sustained Attention: Impaired Sustained Attention Impairment: Functional complex;Verbal complex Memory: Impaired Memory Impairment: Decreased recall of new information;Decreased short term memory Decreased Short Term Memory: Verbal basic (recalled only 3 of 4 words immediately; recalled 2 of 4 words 10 minutes later) Awareness: Appears intact Problem Solving: Impaired Problem Solving Impairment: Functional basic;Functional complex Executive Function: Initiating Initiating: Impaired Initiating Impairment: Functional basic;Functional complex Safety/Judgment: Appears intact Sensation Sensation Light Touch: Impaired by gross assessment (knees down diminished to LT) Stereognosis: Not tested Hot/Cold: Not tested Proprioception: Impaired by gross assessment (difficulty discerning at B ankle joints) Coordination Gross Motor Movements are Fluid and Coordinated: No Coordination and Movement Description: pain dominant (spine) and B LEs extremely weak Finger Nose Finger Test: pathway and  accuracy [facility] bilaterally Heel Shin Test: not tested due to muscle weakness Motor  Motor Motor: Other (comment) Motor - Skilled Clinical Observations: slowed movements; pain dominant; B LEs swollen  Mobility Bed Mobility Bed Mobility: Rolling Right;Rolling Left;Right Sidelying to Sit;Sit to Sidelying Right Rolling Right: 2: Max assist Rolling Right Details: Verbal cues for technique;Manual facilitation for placement Rolling Left: 2: Max assist Rolling Left Details: Verbal cues for technique;Manual facilitation for placement Right Sidelying to Sit: 1: +1 Total assist Right Sidelying to Sit Details: Verbal cues for technique;Manual facilitation for placement;Manual facilitation for weight shifting;Verbal cues for sequencing Sit to Sidelying Right: 1: +1 Total assist Sit to Sidelying Right Details: Verbal cues for technique;Verbal cues for sequencing;Manual facilitation for placement;Manual facilitation for weight shifting Transfers Transfers: No (Pt requires mechanical lift for transfers) Locomotion  Ambulation Ambulation: No Gait Gait: No Stairs / Additional Locomotion Stairs: No Wheelchair Mobility Wheelchair Mobility: No  Trunk/Postural Assessment  Cervical Assessment Cervical Assessment: Exceptions to [facility] Cervical AROM Overall Cervical AROM: Deficits;Due to pain Overall Cervical AROM Comments: head held in downward gaze; gross AROM limited by pain 75% Thoracic Assessment Thoracic Assessment: Exceptions to [facility] Thoracic AROM Overall Thoracic AROM: Deficits;Due to pain Overall Thoracic AROM Comments: limited thoracic extension 50%; chronic slouched sitting posture Lumbar Assessment Lumbar Assessment: Exceptions to [facility] Lumbar AROM Overall Lumbar AROM: Deficits;Due to pain Overall Lumbar AROM Comments: decreased lumbar lordosis - movement limited 50% due to pain Postural Control Postural Control: Deficits on evaluation Head Control: head held in downward position; pain  limited Trunk Control: trunk held in slouched position with strong reliance on B UEs Righting Reactions: slowed and limited due to difficulty weight shifting off of UE support Protective Responses: slowed and limited due to difficulty weight shifting off of UE support Postural Limitations: slouched, head held down  Balance Balance Balance Assessed: Yes Static Sitting Balance Static Sitting - Balance Support: Bilateral upper extremity supported;Feet unsupported Static Sitting - Level of Assistance: 3: Mod assist (progresses to CGA with PT verbal cues for weightshifting and hand placement) Dynamic Sitting Balance Dynamic Sitting - Balance Support: Feet supported;During functional activity;Right upper extremity supported;Left upper extremity supported Dynamic Sitting - Level of Assistance: 4: Min assist Sitting balance - Comments: reaching with min excursion to hold  PT's hand for a second, then back on bed Extremity Assessment  RUE Assessment RUE Assessment: Within Functional Limits LUE Assessment LUE Assessment: Within Functional Limits RLE Assessment RLE Assessment: Exceptions to [facility] RLE Strength RLE Overall Strength: Deficits RLE Overall Strength Comments: R hip flexion 2+/5, hip abduction 2-/5, hip adduction 2/5, ankle DF 3-/5 LLE Assessment LLE Assessment: Exceptions to [facility] LLE Strength LLE Overall Strength: Deficits;Due to pain LLE Overall Strength Comments: hip flexion 3-/5, hip adduction 3+/5, hip abduction 2/5, ankle DF 3+/5  FIM:  FIM - Bed/Chair Transfer Bed/Chair Transfer Assistive Devices: HOB elevated Bed/Chair Transfer: 1: Supine > Sit: Total A (helper does all/Pt. < 25%);1: Two helpers FIM - Locomotion: Wheelchair Distance: 50 Locomotion: Wheelchair: 2: Travels 6 - 149 ft with minimal assistance (Pt.>75%) FIM - Locomotion: Ambulation Locomotion: Ambulation: 0: Activity did not occur FIM - Locomotion: Stairs Locomotion: Stairs: 0: Activity did not occur   Refer  to Care Plan for Long Term Goals  Recommendations for other services: None  Discharge Criteria: Patient will be discharged from PT if patient refuses treatment 3 consecutive times without medical reason, if treatment goals not met, if there is a change in medical status, if patient makes no progress towards goals or if patient is discharged from hospital.  The above assessment, treatment plan, treatment alternatives and goals were discussed and mutually agreed upon: by patient  [facility] Toste M 07/19/2014, 9:55 AM

## 2014-07-19 NOTE — Progress Notes (Signed)
Patient information reviewed and entered into eRehab system by Yazid Pop, RN, CRRN, PPS Coordinator.  Information including medical coding and functional independence measure will be reviewed and updated through discharge.     Per nursing patient was given "Data Collection Information Summary for Patients in Inpatient Rehabilitation Facilities with attached "Privacy Act Statement-Health Care Records" upon admission.  

## 2014-07-19 NOTE — Progress Notes (Signed)
[facility] Admission Coordinator Signed Physical Medicine and Rehabilitation PMR Pre-admission Service date: 07/18/2014 3:11 PM  Related encounter: Admission (Discharged) from 07/10/2014 in [place]   PMR Admission Coordinator Pre-Admission Assessment  Patient: Travis [name] is an 68 y.o., male  MRN: [id number]  DOB: 04/20/1946  Height: _0  (185.4 cm)  Weight: 119.8 kg (264 lb 1.8 oz)  Insurance Information  HMO: PPO: yes PCP: IPA: 80/20: OTHER:  PRIMARY: Blue Medicare Policy#: [id number] Subscriber: self  CM Name: Alphonsa Overall Phone#: [phone] Fax#: [phone]  Updates due on  Pre-Cert#: Employer: working as Medical illustrator  Benefits: Phone #: [phone] Name: Walker Shadow. Date: 12-29-13 Deduct: none Out of Pocket Max: $4900 (met $503.75) Life Max: none  CIR: $285/day copay for days 1-6, pre-auth needed SNF: $0/day for days 1-20; $60/day for days 21-100 (100 day visit max) pre-auth needed  Outpatient: 100% Co-Pay: $40 copay, no visit limits  Home Health: 100% Co-Pay: none, no visit limits, pre-auth needed  DME: 80% Co-Pay: 20%  Providers: in network  Emergency Contact Information    Contact Information     Name  Relation  Home  Work  Mobile     Boffa,Carolyn  Spouse  [phone]  [phone]  [place]  Daughter    [phone]        Current Medical History  Patient Admitting Diagnosis: Cervical myelopathy with deconditioning after pneumonia requiring intubation  History of Present Illness: Travis [name] is a 68 y.o. 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 CXR with evidence of pneumonia and patient with progressive respiratory failure requiring intubation past admission. He was started on IV antibiotics for sepsis as well as IVF bolus for hypotension. Hospital course  complicated by AKI due to ATN, agitation as well as intermittent fevers. He completed antibiotics on 07/19 and tolerated extubation. Renal status slowly improving with increase in UOP. Has had SOB that has improved with diuresis. 2D echo with normal LVF. Cardiology consulted for input on CHF and recommended continuing IV diuresis with question of acute on chronic CHF. PT/OT evaluations done today and CIR recommended due to deconditioned state.  Note: Recommendation for thoracic / lumbar MRI note - patient declined as He states that he had spine MRI done several years back and he is significantly claustrophobic  Past Medical History    Past Medical History    Diagnosis  Date    .  PONV (postoperative nausea and vomiting)     .  Hypertension     .  DJD (degenerative joint disease)       WHOLE SPINE    .  Hyperthyroidism       Had Iodine to resolve issue DX 10 YR AGO    .  Hypothyroidism       Low d/t treatment of hyperthyroidism    .  Sleep apnea       DX 2009/2010 study done at [facility]    .  Type II diabetes mellitus       diet controlled    .  Heart murmur       Evaluated by cardiologist 4-5 years ago but does not currently see a cardiologist    .  Gait disorder     .  Cervical myelopathy       with  myelomalacia at C5-6    .  RLS (restless legs syndrome)     .  Peripheral neuropathy     .  Lumbosacral spinal stenosis  03/13/2014    .  Constipation - functional       controlled with miralax    .  RBBB      Family History  family history includes CAD in an other family member; Diabetes in his father; Heart disease in his father; Hypertension in his father; Polycythemia in his mother.  Prior Rehab/Hospitalizations: pt had outpt PT for gait/strengthening issues  Current Medications  Current facility-administered medications:0.9 % sodium chloride infusion, , Intravenous, Continuous, Rush Farmer, MD, Last Rate: 10 mL/hr at 07/17/14 0800; acetaminophen (TYLENOL) solution 650 mg, 650 mg,  Oral, Q6H PRN, [name], MD, 650 mg at 07/16/14 2103; albuterol (PROVENTIL) (2.5 MG/3ML) 0.083% nebulizer solution 2.5 mg, 2.5 mg, Nebulization, Q2H PRN, Jonetta Osgood, MD, 2.5 mg at 07/16/14 1551  ALPRAZolam (XANAX) tablet 0.5 mg, 0.5 mg, Oral, TID PRN, Doree Fudge, MD, 0.5 mg at 07/18/14 1259; enoxaparin (LOVENOX) injection 40 mg, 40 mg, Subcutaneous, Q24H, [name], MD, 40 mg at 07/17/14 2154; furosemide (LASIX) tablet 40 mg, 40 mg, Oral, BID, Candee Furbish, MD, 40 mg at 07/18/14 8527; gabapentin (NEURONTIN) capsule 300 mg, 300 mg, Oral, 3 times per day, Doree Fudge, MD, 300 mg at 07/18/14 1259  insulin aspart (novoLOG) injection 0-15 Units, 0-15 Units, Subcutaneous, TID WC, Doree Fudge, MD, 5 Units at 07/18/14 1259; insulin aspart (novoLOG) injection 0-5 Units, 0-5 Units, Subcutaneous, QHS, Konstantin Zubelevitskiy, MD; insulin glargine (LANTUS) injection 20 Units, 20 Units, Subcutaneous, QHS, Konstantin Zubelevitskiy, MD  levothyroxine (SYNTHROID, LEVOTHROID) tablet 150 mcg, 150 mcg, Per Tube, QAC breakfast, [name], MD, 150 mcg at 07/18/14 0748; metaxalone (SKELAXIN) tablet 400 mg, 400 mg, Oral, TID, Doree Fudge, MD, 400 mg at 07/18/14 0934; metoprolol tartrate (LOPRESSOR) tablet 25 mg, 25 mg, Oral, BID, Candee Furbish, MD, 25 mg at 07/18/14 7824  morphine 4 MG/ML injection 4 mg, 4 mg, Intravenous, Q3H PRN, Mariea Clonts, MD, 4 mg at 07/17/14 0450; ondansetron [facility]) injection 4 mg, 4 mg, Intravenous, Q6H PRN, Mariea Clonts, MD, 4 mg at 07/16/14 2146; OxyCODONE (OXYCONTIN) 12 hr tablet 20 mg, 20 mg, Oral, Q12H, Doree Fudge, MD; pramipexole (MIRAPEX) tablet 0.25 mg, 0.25 mg, Per Tube, BID, [name], MD, 0.25 mg at 07/18/14 0934  sodium chloride 0.9 % injection 3 mL, 3 mL, Intravenous, Q12H, Jonetta Osgood, MD, 3 mL at 07/17/14 2202  Patients Current Diet: Cardiac  Precautions / Restrictions  Precautions   Precautions: Fall  Restrictions  Weight Bearing Restrictions: No  Prior Activity Level  Community (5-7x/wk): 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 Pt would use his cane for community mobility and use the old, hand me down walker for at home.  Home Assistive Devices / Equipment  Home Equipment: Kasandra Knudsen - single point;Walker - 2 wheels (walker is 70 years old)  Prior Functional Level  Prior Function  Level of Independence: Needs assistance  Gait / Transfers Assistance Needed: Walked with the cane all the time  ADL's / Homemaking Assistance Needed: Independent with bathing/dressing. Could stand in the shower to wash. Wife occasionally helps with socks and shoes  Current Functional Level    Cognition  Overall Cognitive Status: Within Functional Limits for tasks assessed  Orientation Level: Oriented X4    Extremity Assessment  (includes Sensation/Coordination)      ADLs  Overall ADL's : Needs assistance/impaired  Grooming: Oral care;Minimal assistance;Sitting  Grooming Details (indicate cue type and reason): extended time and rest breaks to complete task  Upper Body Bathing: Moderate assistance  Lower Body Bathing: Total assistance;Sit to/from stand  General ADL Comments: Pt c/o back pain and hx of back pain. pt and wife educated on log roll to maintain good back health. pt reports "that was good. You girls are good" Pt required sara plus to complete sit<>Stand with total +2 max (A) to achieve upright posture in machine. Pt transfered to chair this session. RN staff educated on the need to maxi move hoyer for return to bed in 2 hrs. Pt and wife educated on 2 hours in chair max for skin intregity at this time.    Mobility  Overal bed mobility: +2 for physical assistance;Needs Assistance  Bed Mobility: Rolling;Sidelying to Sit  Rolling: Mod assist;+2 for physical assistance  Sidelying to sit: Max assist;+2 for physical assistance  General bed mobility comments: VC's for  sequencing and technique. Instructed in log roll as pt complaining of back pain/spasms. Ongoing back pain issues at baseline. Max +2 for trunk elevation to full sitting position.    Transfers  Overall transfer level: Needs assistance  Equipment used: [name])  Transfers: Stand Pivot Transfers  Stand pivot transfers: Total assist;+2 safety/equipment;+2 physical assistance  General transfer comment: Used Sara Plus lift to transition from bed to recliner. Pt required support of the lift as well as under hips with bed pad to power-up into standing and maintain extended hips/knees. Tactile cueing for back extension as pt resting chest and chin on front tray of Sara Plus at times. Pt required extensive input to extend hips with max cueing.    Ambulation / Gait / Stairs / Wheelchair Mobility  Ambulation/Gait  General Gait Details: Unable at this time    Posture / Balance  Dynamic Sitting Balance  Sitting balance - Comments: OT providing posterior support while pt sitting EOB. Max assist required.    Special needs/care consideration  BiPAP/CPAP - wears CPAP  CPM no  Continuous Drip IV no  Dialysis no  Life Vest no  Oxygen - currently on 3 L O2  Special Bed no  Trach Size no  Wound Vac (area) no  Skin - pt with reddened bottom per wife and tender  Bowel mgmt: last BM on 07-14-14  Bladder mgmt: currently with foley  Diabetic mgmt - was managed by diet alone prior to admit    Previous Home Environment  Living Arrangements: Spouse/significant other  Available Help at Discharge: Family;Available 24 hours/day  Type of Home: House  Home Layout: One level  Home Access: Stairs to enter  Entrance Stairs-Rails: None  Entrance Stairs-Number of Steps: 2  Discharge Living Setting  Plans for Discharge Living Setting: Patient's home  Type of Home at Discharge: House  Discharge Home Layout: One level  Discharge Home Access: Stairs to enter  Entrance Stairs-Rails: (has brick columns on both sides used  for support)  Entrance Stairs-Number of Steps: 2  Does the patient have any problems obtaining your medications?: No  Social/Family/Support Systems  Patient Roles: Spouse (working full time as an ind. Medical illustrator)  Sport and exercise psychologist Information: wife [name] is primary contact  Anticipated Caregiver: wife  Anticipated Ambulance person Information: see above  Ability/Limitations of Caregiver: wife is home 24-7  Caregiver Availability: 24/7  Discharge Plan Discussed with Primary Caregiver: Yes  Is Caregiver In Agreement with Plan?: Yes  Does Caregiver/Family have Issues  with Lodging/Transportation while Pt is in Rehab?: No  Goals/Additional Needs  Patient/Family Goal for Rehab: Min Assist with PT/OT, NA for SLP  Expected length of stay: 13-18 days  Cultural Considerations: none  Dietary Needs: heart healthy  Equipment Needs: to be determined  Pt/Family Agrees to Admission and willing to participate: Yes (spoke with pt, his wife and dtr on 7-21)  Program Orientation Provided & Reviewed with Pt/Caregiver Including Roles & Responsibilities: Yes  Decrease burden of Care through IP rehab admission: NA  Possible need for SNF placement upon discharge: not anticipated  Patient Condition: This patient's condition remains as documented in the consult dated 07-17-14, in which the Rehabilitation Physician determined and documented that the patient's condition is appropriate for intensive rehabilitative care in an inpatient rehabilitation facility. Will admit to inpatient rehab today.  Preadmission Screen Completed By: Nanetta Batty, PT 07/18/2014 3:11 PM  ______________________________________________________________________  Discussed status with Dr. Naaman Plummer on 07-18-14 at 1510 and received telephone approval for admission today.  Admission Coordinator: Clary Meeker,PT time1510/Date7-21-15    Cosigned by: Meredith Staggers, MD [07/18/2014 3:46 PM]

## 2014-07-20 ENCOUNTER — Inpatient Hospital Stay (HOSPITAL_COMMUNITY): Payer: Medicare Other | Admitting: Occupational Therapy

## 2014-07-20 ENCOUNTER — Inpatient Hospital Stay (HOSPITAL_COMMUNITY): Payer: Medicare Other | Admitting: *Deleted

## 2014-07-20 ENCOUNTER — Inpatient Hospital Stay (HOSPITAL_COMMUNITY): Payer: Medicare Other | Admitting: Physical Therapy

## 2014-07-20 LAB — GLUCOSE, CAPILLARY
GLUCOSE-CAPILLARY: 218 mg/dL — AB (ref 70–99)
GLUCOSE-CAPILLARY: 144 mg/dL — AB (ref 70–99)
GLUCOSE-CAPILLARY: 97 mg/dL (ref 70–99)
Glucose-Capillary: 190 mg/dL — ABNORMAL HIGH (ref 70–99)

## 2014-07-20 LAB — BASIC METABOLIC PANEL
Anion gap: 15 (ref 5–15)
BUN: 51 mg/dL — AB (ref 6–23)
CO2: 32 meq/L (ref 19–32)
Calcium: 9 mg/dL (ref 8.4–10.5)
Chloride: 96 mEq/L (ref 96–112)
Creatinine, Ser: 1.01 mg/dL (ref 0.50–1.35)
GFR calc Af Amer: 86 mL/min — ABNORMAL LOW (ref >90)
GFR, EST NON AFRICAN AMERICAN: 74 mL/min — AB (ref >90)
GLUCOSE: 190 mg/dL — AB (ref 70–99)
Potassium: 3.7 mEq/L (ref 3.7–5.3)
Sodium: 143 mEq/L (ref 137–147)

## 2014-07-20 LAB — URINE CULTURE

## 2014-07-20 MED ORDER — BACLOFEN 10 MG PO TABS
10.0000 mg | ORAL_TABLET | Freq: Three times a day (TID) | ORAL | Status: DC
  Administered 2014-07-20 – 2014-08-09 (×60): 10 mg via ORAL
  Filled 2014-07-20 (×64): qty 1

## 2014-07-20 MED ORDER — GABAPENTIN 400 MG PO CAPS
400.0000 mg | ORAL_CAPSULE | Freq: Three times a day (TID) | ORAL | Status: DC
  Administered 2014-07-20 – 2014-08-09 (×60): 400 mg via ORAL
  Filled 2014-07-20 (×63): qty 1

## 2014-07-20 NOTE — Progress Notes (Signed)
[facility]ICINE [facility] REHABILITATION     PROGRESS NOTE    Subjective/Complaints: Complains of cramping/burning in both legs particularly in pm and overnight.   Objective: Vital Signs: Blood pressure 146/67, pulse 77, temperature 98.1 F (36.7 C), temperature source Oral, resp. rate 18, height 6\' 1"  (1.854 m), weight 120.6 kg (265 lb 14 oz), SpO2 93.00%. No results found.  Recent Labs  07/18/14 0500 07/19/14 0526  WBC 6.5 7.3  HGB 11.8* 12.7*  HCT 37.6* 39.8  PLT 288 316    Recent Labs  07/19/14 0526 07/20/14 0445  NA 148* 143  K 3.9 3.7  CL 101 96  GLUCOSE 147* 190*  BUN 52* 51*  CREATININE 0.96 1.01  CALCIUM 9.5 9.0   CBG (last 3)   Recent Labs  07/19/14 1613 07/19/14 2055 07/20/14 0711  GLUCAP 137* 132* 218*    Wt Readings from Last 3 Encounters:  07/20/14 120.6 kg (265 lb 14 oz)  07/18/14 119.8 kg (264 lb 1.8 oz)  03/13/14 131.09 kg (289 lb)    Physical Exam:  Constitutional: He is oriented to person, place, and time. He appears well-developed and well-nourished. Side lying in bed  HENT: dentition fair. Oral mucosa pink/moist  Head: Normocephalic and atraumatic.  Eyes: Conjunctivae are normal. Pupils are equal, round, and reactive to light.  Neck: Normal range of motion. Neck supple.  Cardiovascular: Normal rate and regular rhythm. No murmurs  Respiratory: Effort normal and breath sounds normal. No respiratory distress. He has wheezes in upper airway.  GI: Soft. Bowel sounds are normal. He exhibits no distension. There is no tenderness.  Musculoskeletal: He exhibits trace edema in both legs.. Has low back pain with minimal lower extremity movement and with bed mobility Neurological: He is alert and oriented to person, place, and time.  Speech slow. Tremors BUE. Able to follow basic commands without difficulty. Dysesthesias bilateral feet.  CN exam normal.  extremity: Intrinsic atrophy bilateral hands.  Motor strength is 4 minus bilateral  grip 5 bilateral biceps and deltoid, 4/5 left triceps 4/5 right tricep, wrist extension 4+  2 minus hip flexion knee extension ankle dorsiflexion and plantarflexion  Sensation intact to light touch in bilateral upper and lower extremities except for decreased proprioception in bilateral feet.  Deep tendon reflexes 2+ bilateral knees trace bilateral biceps and brachioradialis and triceps, 1+ patella,achilles  Ankles without clonus  Skin: chronic changes on feet/ dry, scaly skin, small reddened area left buttock, small stage 2 right buttock. Back surgery site intact  Psych: he's withdrawn, flat. Non-anxious on my eval   Assessment/Plan: 1. Functional deficits secondary to deconditioning, hx of cervical myelopathy which require 3+ hours per day of interdisciplinary therapy in a comprehensive inpatient rehab setting. Physiatrist is providing close team supervision and 24 hour management of active medical problems listed below. Physiatrist and rehab team continue to assess barriers to discharge/monitor patient progress toward functional and medical goals. FIM: FIM - Bathing Bathing Steps Patient Completed: Chest;Right Arm;Left Arm;Abdomen;Front perineal area Bathing: 1: Two helpers (Mod A for parts completed but required 2 helpers  for LB )  FIM - Upper Body Dressing/Undressing Upper body dressing/undressing steps patient completed: Thread/unthread right sleeve of pullover shirt/dresss;Thread/unthread left sleeve of pullover shirt/dress;Put head through opening of pull over shirt/dress Upper body dressing/undressing: 4: Min-Patient completed 75 plus % of tasks (Pt. needed to be in supported sitting to be successful - in wheelchair at sink,) FIM - Lower Body Dressing/Undressing Lower body dressing/undressing: 1: Two helpers (supine with  rolling)  FIM - Toileting Toileting: 1: Total-Patient completed zero steps, helper did all 3     FIM - Bed/Chair Transfer Bed/Chair Transfer Assistive  Devices: HOB elevated Bed/Chair Transfer: 1: Supine > Sit: Total A (helper does all/Pt. < 25%);1: Two helpers  FIM - Locomotion: Wheelchair Distance: 50 Locomotion: Wheelchair: 2: Travels 41 - 149 ft with minimal assistance (Pt.>75%) FIM - Locomotion: Ambulation Locomotion: Ambulation: 0: Activity did not occur  Comprehension Comprehension Mode: Auditory Comprehension: 4-Understands basic 75 - 89% of the time/requires cueing 10 - 24% of the time  Expression Expression Mode: Verbal Expression: 5-Expresses basic 90% of the time/requires cueing < 10% of the time.  Social Interaction Social Interaction: 4-Interacts appropriately 75 - 89% of the time - Needs redirection for appropriate language or to initiate interaction.  Problem Solving Problem Solving: 3-Solves basic 50 - 74% of the time/requires cueing 25 - 49% of the time  Memory Memory Mode: Not assessed Memory: 5-Recognizes or recalls 90% of the time/requires cueing < 10% of the time  Medical Problem List and Plan:  1. Functional deficits secondary to Cervical myelopathy with deconditioning after pneumonia requiring intubation  2. DVT Prophylaxis/Anticoagulation: Pharmaceutical: Lovenox  3. Chronic Pain Management: Off Volatren and NSAIDS.   -oxycontin  -added baclofen for cramping/spasms  -increase gabapentin for neruopathic pain  4. Mood: Monitor as mentation improves. Will have LCSW follow for evaluation and support.  5. Neuropsych: This patient is capable of making decisions on his own behalf.  6. 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  8. HTN: Continue metoprolol and lasix. Cards recommendations: if needed to add norvasc and resume ACE if renal status stable.   -added 5mg  norvasc 9. Acute on chronic diastolic CHF: Daily weights. Low salt diet. Patient non compliant at home--will have RD educate on appropriate  diet.  10. Hypotension with AKI: BUN holding around 50 11. BPH: foley d/c'ed Off cardura and ditropan at this time. Monitor voiding.  12. DM type 2 diet controlled:  Hgb A1c-7.0. BS poorly controlled due to stress/infection requiring lantus. Will monitor BS with ac/hs checks and start patient on oral agent. Will continue lantus for now---cbg's still elevated.  13. Insomnia with RLS: Continue Mirapex. Monitor sleep wake cycle.  14. Constipation:  bowel program.  LOS (Days) 2 A FACE TO FACE EVALUATION WAS PERFORMED  Travis [name] T 07/20/2014 8:18 AM

## 2014-07-20 NOTE — Progress Notes (Signed)
Occupational Therapy Session Note  Patient Details  Name: Travis [name] MRN: [id number] Date of Birth: 12-13-1946  Today's Date: 07/20/2014 Time: [id number] and 1330-1420 Time Calculation (min): 46 min and 50 min  Short Term Goals: Week 1:  OT Short Term Goal 1 (Week 1): Pt. will perform bathing , seated and standing, with Max A  in order to increase I in self care.  OT Short Term Goal 2 (Week 1): Pt. will engage in 10 minutes of functional activity without requiring rest break in order to increase endurance for occupational performance. OT Short Term Goal 3 (Week 1): Pt. will perform LB dressing with Max A in order to increase I with ADLs.  OT Short Term Goal 4 (Week 1): Pt. will perform supine to sit with Max A in order to  increase I  in bed mobility. OT Short Term Goal 5 (Week 1): Pt. will perform static sitting balance at EOB with feet supported and supervision for 2 or more minutes in order to increase balance for occupational performance.   Skilled Therapeutic Interventions/Progress Updates:   Session 1: Upon entering the room, pt supine in bed. Therapist continues to provide verbal cues and assist with facilitation of log rolls and back precautions in order to prevent increased pain with activity. Pt required physical assist with L LE and trunk for supine to sit with verbal cues and facilitation for weight shifting of hips forwards and backwards from EOB. Pt engaged in bathing at bed level. Pt required verbal cues for safety and SBA -  min A for dynamic sitting EOB balance with B feet supported and occasional 1-2 hands. OT facilitated LB dressing and washing of buttocks with use of Steady and 2 person assist for standing. Pt required verbal cues and manual facilitation for neutral pelvic position when standing. Sliding board transfer reviewed and facilitated from bed to wheelchair with verbal cues and min A for balance and LE positioning.  Pt engaged in grooming at sink side while seated in  tilt in space wheelchair with set up A. Pt continues to require frequent rest breaks secondary to fatigue.   Session 2: Upon entering the room, pt supine in bed and motivated for session. Pt performed supine to sit with therapist facilitating  log rolling and max A with verbal cues. OT educated and demonstrated sliding board transfer from bed to [facility]. OT continues to review proper technique for board placement. Pt continues to verbalize "hip" when asked where to position board. Pt performed sliding board transfer from bed to [facility] with Max A to facilitate slide and B LE placement. Pt has tendency to slide forward versus sliding on side to board. Pt transferred back to bed secondary to clothing wet from incontinence prior to session requiring assist of 2 with use of sliding board. Pt required verbal cues and manual facilitation to lean towards L to move hips to R side up board. Pt performed lateral leans bilaterally x 4 each side to assist in pulling down pants and underwear. Pt required assistance to remove and to thread and donn clean clothing. Sit to supine assist of 2 for trunk and bilateral legs to assist in LB clothing management. Rolling to L and R with verbal cues, manual facilitation/placement of legs for log rolling, and physical assistance. Ice applied at end of session per pt request secondary to pain.    Therapy Documentation Precautions:  Precautions Precautions: Fall Precaution Comments: chronic back pain - performs bending with no B,L,T and log rolls  Restrictions Weight Bearing Restrictions: No   Vital Signs: Pt continues to be on 2L O2 via [place] with SO2 at 94% ~ 30 minutes into session and 77 bpm.   Pain: Pt with no c/o pain this session.  Session 2: Pt with c/o pain in lumbar region of back at end of session described as "cramps". Ice pack applied.  See FIM for current functional status  Therapy/Group: Individual Therapy  [name] 07/20/2014, 10:35 AM

## 2014-07-20 NOTE — Progress Notes (Signed)
Patient refused CPAP tonight. Pt aware if he changes his mind to have RN call RT.

## 2014-07-20 NOTE — Progress Notes (Signed)
Social Work  Social Work Assessment and Plan  Patient Details  Name: Travis [name] MRN: [id number] Date of Birth: Jan 14, 1946  Today's Date: 07/20/2014  Problem List:  Patient Active Problem List   Diagnosis Date Noted  . Acute diastolic heart failure [id number]  . Cervical arthritis with myelopathy 07/18/2014  . Constipation - functional   . CAP (community acquired pneumonia) 07/10/2014  . Sepsis 07/10/2014  . HTN (hypertension) 07/10/2014  . OSA on CPAP 07/10/2014  . UTI (lower urinary tract infection) 07/10/2014  . Edema leg 07/10/2014  . Acute respiratory failure with hypoxia 07/10/2014  . 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 Other vitamin B12 deficiency anemia 07/13/2013  . 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 Polyneuropathy in other diseases classified elsewhere 02/17/2013  . Other general symptoms(780.99) 02/17/2013  . Abnormality of gait 02/17/2013  . Cervical spondylosis with myelopathy 02/17/2013   Past Medical History:  Past Medical History  Diagnosis Date  . PONV (postoperative nausea and vomiting)   . Hypertension   . DJD (degenerative joint disease)     WHOLE SPINE  . Hyperthyroidism     Had Iodine to resolve issue DX 10 YR AGO  . Hypothyroidism     Low d/t treatment of hyperthyroidism  . Sleep apnea     DX  2009/2010 study done at [facility]  . Type II diabetes mellitus     diet controlled  . Heart murmur     Evaluated by cardiologist 4-5 years ago but does not currently see a cardiologist  . Gait disorder   . Cervical myelopathy     with myelomalacia at C5-6  . RLS (restless legs syndrome)   . Peripheral neuropathy   . 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 Constipation - functional     controlled with miralax  . RBBB    Past Surgical History:  Past Surgical History  Procedure Laterality Date  . Cholecystectomy    . Back surgery      6 surgeries  . Lumbar laminectomy/decompression microdiscectomy  07/14/2012    Procedure: LUMBAR LAMINECTOMY/DECOMPRESSION MICRODISCECTOMY 2 LEVELS;  Surgeon: Eustace Moore, MD;  Location: [place] [facility];  Service: Neurosurgery;  Laterality: Left;  Lumbar two three laminectomy, left thoracic four five transpedicular diskectomy   Social History:  reports that he quit smoking about 29 years ago. His smoking use included Cigarettes. He has a 40 pack-year smoking history. He quit smokeless tobacco use about 29 years ago. He reports that he does not drink alcohol or use illicit drugs.  Family / Support Systems Marital Status: Married Patient Roles: Spouse (working full time as an ind. Medical illustrator) Spouse/Significant Other: wife, Vaughan Mowat @ [phone] or ([phone] Children: three adult children:  daughter, Steward Drone [facility]) @ (C) [phone];  son, Randall Hiss and son, Evangeline Gula - both living in [place] Anticipated Caregiver: wife Ability/Limitations of Caregiver: wife is home 24-7;  pt notes no limitations - "good health" Caregiver Availability: 24/7 Family Dynamics: pt describes very good relationship with all family.  Notes that his daughter and two daughters-in-law are all RNs (two here at [facility])  Social History Preferred language: English Religion: Christian Cultural Background: NA Education: HS Read: Yes Write: Yes Employment Status: Employed (self employed Medical illustrator) Return to Work Plans: pt hopes to be able to return to work at some point, however, his son is also an agent with him and is managing business while pt unable Freight forwarder Issues: None Guardian/Conservator: None -  per MD, pt capable of making decisions on his own behalf   Abuse/Neglect Physical Abuse: Denies Verbal Abuse: Denies Sexual Abuse: Denies Exploitation of patient/patient's resources: Denies Self-Neglect: Denies  Emotional Status Pt's affect, behavior adn adjustment status: Pt very pleasant and willing to complete interview assessment, however,  admits he is very fatigued.  He feels he has made some progress these first couple of days.  Denies any s/s of depression or anxiety - will monitor. Recent Psychosocial Issues: None Pyschiatric History: None Substance Abuse History: None  Patient / Family Perceptions, Expectations & Goals Pt/Family understanding of illness & functional limitations: pt and family with basic understanding of medical issues which have led to significantly deconditioned state/ need for CIR. Premorbid pt/family roles/activities: Pt was independent, however, does use a cane/ walker when needed due to physical limitations from h/o 8 prior back surgeries Anticipated changes in roles/activities/participation: Expect that wife will have to provide some increase in physical assist to pt as goals are set at minimal assistance. Pt/family expectations/goals: "I need to get my strength back."  [employer]: None Premorbid Home Care/DME Agencies: Other (Comment) (has done OPPT in past - cannot recall name of office) Transportation available at discharge: yes  Discharge Planning Living Arrangements: Spouse/significant other Support Systems: Spouse/significant other;Children;Other relatives Type of Residence: Private residence Insurance Resources: Medicare (*[employer]) Financial Resources: Employment;Social Security Financial Screen Referred: No Living Expenses: Own Money Management: Patient Does the patient have any problems obtaining your medications?: No Home Management: pt and wife shared Patient/Family Preliminary Plans: pt plans to return home with wife as primary support and intermittent assist of children and their spouses Social Work Anticipated Follow Up Needs: HH/OP Expected length of stay: 3 weeks  Clinical Impression Very pleasant gentleman here following resp failure/ pna and significantly deconditioned.  Good family support with wife able to provide 24/7 assistance.  Pt  denies any s/s of emotional distress, however, will monitor while here.  To follow for d/c planning and support needs.  Valdemar Mcclenahan 07/20/2014, 4:10 PM

## 2014-07-20 NOTE — Care Management Note (Signed)
Inpatient [facility] Individual Statement of Services  Patient Name:  Travis [name]  Date:  07/20/2014  Welcome to the [facility].  Our goal is to provide you with an individualized program based on your diagnosis and situation, designed to meet your specific needs.  With this comprehensive rehabilitation program, you will be expected to participate in at least 3 hours of rehabilitation therapies Monday-Friday, with modified therapy programming on the weekends.  Your rehabilitation program will include the following services:  Physical Therapy (PT), Occupational Therapy (OT), 24 hour per day rehabilitation nursing, Therapeutic Recreaction (TR), Case Management (Social Worker), Rehabilitation Medicine, Nutrition Services and Pharmacy Services  Weekly team conferences will be held on Tuesdays to discuss your progress.  Your Social Worker will talk with you frequently to get your input and to update you on team discussions.  Team conferences with you and your family in attendance may also be held.  Expected length of stay: 3 weeks  Overall anticipated outcome: minimal assistance  Depending on your progress and recovery, your program may change. Your Social Worker will coordinate services and will keep you informed of any changes. Your Social Worker's name and contact numbers are listed  below.  The following services may also be recommended but are not provided by the [facility] will be made to provide these services after discharge if needed.  Arrangements include referral to agencies that provide these services.  Your insurance has been verified to be:  [employer] Your primary doctor is:  Dr. Marisue Humble  Pertinent information will be shared with your doctor and your insurance company.  Social  Worker:  [place], [place] or ([phone]   Information discussed with and copy given to patient by: Lennart Pall, 07/20/2014, 3:04 PM

## 2014-07-20 NOTE — Progress Notes (Signed)
Physical Therapy Session Note  Patient Details  Name: Travis [name] MRN: [id number] Date of Birth: [birth year]  Today's Date: 07/20/2014 Time: 1015-1100 ; Tx 2: 1500-16-- Time Calculation (min): 45 min; Tx 2: 60 min  Short Term Goals: Week 1:  PT Short Term Goal 1 (Week 1): Pt will demonstrate log rolling in bed with rail req min A.  PT Short Term Goal 2 (Week 1): Pt will demonstrate side lie to sit transfer with rail req mod A.  PT Short Term Goal 3 (Week 1): Pt will demonstrate sit to stand with RW req max A.  PT Short Term Goal 4 (Week 1): Pt will demonstrate w/c propulsion x 100' req SBA.  PT Short Term Goal 5 (Week 1): Pt will demonstrate ambulation in // bars x 10' req mod A.   Skilled Therapeutic Interventions/Progress Updates:    Gait Training: PT instructs pt in ambulation req Sara Plus lift and +2 assist x 12', req verbal and tactile cues for weightshifting and stabilization of Clarise Cruz and progression of w/c behind for safety.   Therapeutic Activity: PT instructs pt in Sit to stand in // bars max A x 2 with mirror feedback and verbal cues for upright posture.  PT instructs pt in Slideboard transfer w/c to bed to the L req max A x 1 and min A x 1 with pillow case over board to reduce friction.   W/C Management: PT instructs pt in w/c propulsion with B UEs x 95' + 50' req SBA and verbal cues -pt demonstrates drifting towards the L wall repeatedly and repeatedly req cues to push harder with the L UE.   Therapeutic Exercise: PT instructs pt in Supine bed level exercises: heel slides x 5, supine hip abduction/adduction x 5, ankle pumps x 20, hip IR/ER with knees straight x 20 reps, each LE, for strengthening and ROM.   Session 2: Therapeutic Act: PT instructs pt in slideboard transfer to L with pillowcase on slideboard to reduce friction: max A x 1 with verbal cues to lean forward and for hand placement. Second assist standing closeby in case needed.   PT instructs pt in sit to  stand from w/c with Ethelene Hal req max A x 2 during transition and once standing req min A x 2 for balance - pt demonstrates ability to pick up feet and shift weight under him once in full upright stance.  PT instructs pt in head-hips relationship for scooting bottom back in chair including: tucking feet for close pivot point, leaning trunk forward, and using B UEs to push down on armrests to get bottom back - req occasional min A at trunk or blocking knees to assist.   W/C Management: PT instructs pt in w/c propulsion with B UEs x 100' req SBA and verbal cues to avoid hugging the L wall. PT instructs pt in w/c propulsion with B LEs only x 30' req mod A - focus on gentle hamstring strengthening in a functional activity.   Neuromuscular Reeducation: PT instructs pt in postural reeducation in sitting and in standing. In sitting, PT tightens head rest support and instructs pt to use tactile feedback from headrest to guide whether or not he is sitting in midline positioninig and pt verbalizes understanding, but with fatigue returns to leaning R position. PT instructs pt in upright and midline posture in both Ethelene Hal and Standing frame with tactile cues for shoulder depression, scapular adduction, spine extension (towards neutral due to pt's stiff back).  PT  instructs pt in head-hips relationship and how this will assist him in functional activities of completing sit to stand. +2 assist in bottom clearance with momentum while pt's B hands are planted on mat and therapist blocks pt's knees and assists with bottom clearance: 2 x 5-10 reps.  PT instructs pt in mini-squats in standing frame, focus on achieving terminal stance x 10 reps.   Pt continues to fatigue extremely quickly, but is progressing with functional mobility, including able to ambulate for the first time, today. Pt is progressing in ability to sit to stand and propel w/c, but demonstrates midline disorientation, and is frequently found leaning  on his R side. When pt is placed in midline, he reports he is leaning L. Pt will benefit from continued generalized conditioning and strengthening, with safe progress towards ambulation with LRAD, as tolerated.    Therapy Documentation Precautions:  Precautions Precautions: Fall Precaution Comments: chronic back pain - performs bending with no B,L,T and log rolls Restrictions Weight Bearing Restrictions: No General:   Vital Signs:  Session 1: Pt desaturates to 91% on 2 L O2/min after ambulation.  Session 2: Pt demonstrates SaO2 of >= 95% on 2 L O2/min throughout PT session.  Pain: Pain Assessment Pain Assessment: 0-10 Pain Score: 9  Pain Type: Neuropathic pain Pain Location: Leg Pain Orientation: Right;Left Pain Descriptors / Indicators: Burning Pain Intervention(s): Rest;Repositioned Multiple Pain Sites: No Session 2: Pt c/o 5/10 mid back pain with movement; pain abolishes with rest.    Locomotion : Ambulation Ambulation/Gait Assistance: 1: +2 Total assist Wheelchair Mobility Distance: 95' + 63'   See FIM for current functional status  Therapy/Group: Co-Treatment with Leory Plowman, PTA as a 2nd assist  [facility] M 07/20/2014, 11:14 AM

## 2014-07-21 ENCOUNTER — Inpatient Hospital Stay (HOSPITAL_COMMUNITY): Payer: Medicare Other

## 2014-07-21 ENCOUNTER — Inpatient Hospital Stay (HOSPITAL_COMMUNITY): Payer: Medicare Other | Admitting: Occupational Therapy

## 2014-07-21 DIAGNOSIS — A419 Sepsis, unspecified organism: Secondary | ICD-10-CM

## 2014-07-21 DIAGNOSIS — M4712 Other spondylosis with myelopathy, cervical region: Secondary | ICD-10-CM

## 2014-07-21 DIAGNOSIS — I5031 Acute diastolic (congestive) heart failure: Secondary | ICD-10-CM

## 2014-07-21 DIAGNOSIS — Z5189 Encounter for other specified aftercare: Secondary | ICD-10-CM

## 2014-07-21 DIAGNOSIS — J189 Pneumonia, unspecified organism: Secondary | ICD-10-CM

## 2014-07-21 DIAGNOSIS — R5381 Other malaise: Secondary | ICD-10-CM

## 2014-07-21 LAB — GLUCOSE, CAPILLARY
GLUCOSE-CAPILLARY: 137 mg/dL — AB (ref 70–99)
GLUCOSE-CAPILLARY: 117 mg/dL — AB (ref 70–99)
GLUCOSE-CAPILLARY: 207 mg/dL — AB (ref 70–99)
Glucose-Capillary: 158 mg/dL — ABNORMAL HIGH (ref 70–99)

## 2014-07-21 NOTE — Progress Notes (Signed)
Occupational Therapy Session Note  Patient Details  Name: Travis [name] MRN: [id number] Date of Birth: November 10, 1946  Today's Date: 07/21/2014 Time: 0902-1005 and 1300-1350 Time Calculation (min): 63 min and 30  Short Term Goals: Week 1:  OT Short Term Goal 1 (Week 1): Pt. will perform bathing , seated and standing, with Max A  in order to increase I in self care.  OT Short Term Goal 2 (Week 1): Pt. will engage in 10 minutes of functional activity without requiring rest break in order to increase endurance for occupational performance. OT Short Term Goal 3 (Week 1): Pt. will perform LB dressing with Max A in order to increase I with ADLs.  OT Short Term Goal 4 (Week 1): Pt. will perform supine to sit with Max A in order to  increase I  in bed mobility. OT Short Term Goal 5 (Week 1): Pt. will perform static sitting balance at EOB with feet supported and supervision for 2 or more minutes in order to increase balance for occupational performance.   Skilled Therapeutic Interventions/Progress Updates:    Session 1: Pt engaged in sit to stands from wheelchair height and Steady stander height with assist of 2 for facilitation during functional transfers for ADL tasks. Roll in shower chair utilized in combination with Steady for shower transfer with 2 helpers. LB dressing facilitated with use of steady to pull pants up B hips. Pt required verbal cues and manual facilitation in order to cross opposite leg at knee in order to donn and doff B shoes and tie shoes. Pt requiring vcs to not "throw" leg down afterwards. OT continues to educate pt on energy conservation during shower and how to set up shower environment. OT encouraged and demonstrated pursed lip breathing. Pt continues to require frequent rest breaks secondary to decreased endurance.  Session 2: Upon entering the room, pt. Reports, "I think my diaper is wet."  Steady utilized to assist pt in standing from wheelchair in order to change elastic waist  pants and adult depends secondary to incontinence. Pt requiringphysical assist of 2 and verbal cues from wheelchair for standing into steady frame. Verbal cues required to bring hips into neutral position as well as locking bilateral knees. Once seated on steady pt engaged in lateral leans to the left and right in order to pull elastic waist pants and adult incontinence brief down and given wash cloth to cleanse and dry peri area. Assistance required to thread clean clothing onto B feet. Once in standing position, Mod A required to remain balance while pt assisted in pulling elastic waist pants and brief up bilateral hips. Pt stood in 3 times during session for ~ 45 seconds, 1 minute, and 2 minutes. Pt continues to require verbal cues and demonstration for pursed lip breathing as evident by pt observed to be holding breath at times. Frequent rest breaks for fatigue.    Therapy Documentation Precautions:  Precautions Precautions: Fall Precaution Comments: chronic back pain - performs bending with no B,L,T and log rolls Restrictions Weight Bearing Restrictions: No Vital Signs: Pt. O2 had decreased to 1L/min via [place] with SO2 remaining at 95% after increased activity. HR -77 bpm. Pain: Pain Assessment Pain Assessment: Faces Faces Pain Scale: Hurts little more Pain Type: Chronic pain Pain Location: Back Pain Orientation: Mid Pain Descriptors / Indicators: Aching Pain Onset: With Activity Patients Stated Pain Goal: 0 Pain Intervention(s):  (Pt pre-medicated prior to session) :    See FIM for current functional status  Therapy/Group: Individual  Therapy  [name] 07/21/2014, 10:29 AM

## 2014-07-21 NOTE — Progress Notes (Signed)
Physical Therapy Session Note  Patient Details  Name: Travis [name] MRN: [id number] Date of Birth: 12-Dec-1946  Today's Date: 07/21/2014 Time: 0805-0830 Time Calculation (min): 25 min  Short Term Goals: Week 1:  PT Short Term Goal 1 (Week 1): Pt will demonstrate log rolling in bed with rail req min A.  PT Short Term Goal 2 (Week 1): Pt will demonstrate side lie to sit transfer with rail req mod A.  PT Short Term Goal 3 (Week 1): Pt will demonstrate sit to stand with RW req max A.  PT Short Term Goal 4 (Week 1): Pt will demonstrate w/c propulsion x 100' req SBA.  PT Short Term Goal 5 (Week 1): Pt will demonstrate ambulation in // bars x 10' req mod A.   Skilled Therapeutic Interventions/Progress Updates:    Co-tx w/ PT Georjean Mode for safety. Pt received in bed, agreeable to participate in therapy after receiving nursing care. Session focused on functional mobility and upright tolerance. Pt moved supine >sit w/ HOB elevated w/ ModA and min VC's for sequencing. Pt moved sit>stand for LB dressing w/ +2 Assist. While standing pt found to be incontinent of bowels, PT provided total assist to pt for cleanup while other PT provided MaxA for pt for maintaining standing. After dressing, pt w/ sliding board transfer bed>w/c w/ +2 Assist, able to verbalize and demonstrated head-hips relationship technique w/ min cueing.  Pt initially on 2L O2, sats were maintained above 95%, weaned to 1L O2 and sats remained above 95%.  Therapy Documentation Precautions:  Precautions Precautions: Fall Precaution Comments: chronic back pain - performs bending with no B,L,T and log rolls Restrictions Weight Bearing Restrictions: No General:   Vital Signs:   Pain: Pain Assessment Pain Assessment: Faces Faces Pain Scale: Hurts little more Pain Type: Chronic pain Pain Location: Back Pain Orientation: Mid Pain Descriptors / Indicators: Aching Pain Onset: With Activity Patients Stated Pain Goal: 0 Pain  Intervention(s):  (Pt pre-medicated prior to session)  See FIM for current functional status  Therapy/Group: Individual Therapy  Rada Hay Rada Hay, PT, DPT  07/21/2014, 10:26 AM

## 2014-07-21 NOTE — Progress Notes (Signed)
Physical Therapy Session Note  Patient Details  Name: Travis [name] MRN: [id number] Date of Birth: 1946-03-17  Today's Date: 07/21/2014 Time: 0830-0900 Time Calculation (min): 30 min  Short Term Goals: Week 1:  PT Short Term Goal 1 (Week 1): Pt will demonstrate log rolling in bed with rail req min A.  PT Short Term Goal 2 (Week 1): Pt will demonstrate side lie to sit transfer with rail req mod A.  PT Short Term Goal 3 (Week 1): Pt will demonstrate sit to stand with RW req max A.  PT Short Term Goal 4 (Week 1): Pt will demonstrate w/c propulsion x 100' req SBA.  PT Short Term Goal 5 (Week 1): Pt will demonstrate ambulation in // bars x 10' req mod A.   Skilled Therapeutic Interventions/Progress Updates:  +2 treatment for skilled mobility and therapeutic activities  Pt tx started with pt on 2L  O2, decreased to 1L during tx, with O2 sats above 95% throughout tx.  neuromuscular re-education via set-up, manual cues, VCS, demo for R and L self stretching heel cords and hamstrings, in sitting in w/c, using footstool, x 30 seconds x 2 with each LE, and without footstool by pressing heels into floor with knees at 90 degrees to focus on soleus muscles.  Pt tends to compensate for (probable) long term LE muscular tightness with thoracic flexion in sitting.  Pt educated on above soleus stretching whenever possible when he is sitting in w/c, feet on floor.  Sit> stand in // with +2 assist, pt pulling up on //.  Pt has poor pelvic mobility and transfer of weight forward.  Once standing, pt stood x 2 minutes, x 1 minute, with sitting rest breaks, DOE 3/4 each trial. Pt stated he was too fatigued to take steps in // due to multiple sit>< stands during hygiene and donning shorts at start of tx.    Therapy Documentation Precautions:  Precautions Precautions: Fall Precaution Comments: chronic back pain - performs bending with no B,L,T and log rolls Restrictions Weight Bearing Restrictions: No      See FIM for current functional status  Therapy/Group: Co-Treatment  Eleah Lahaie 07/21/2014, 4:36 PM

## 2014-07-21 NOTE — Progress Notes (Signed)
[facility] PHYSICAL MEDICINE & REHABILITATION     PROGRESS NOTE    Subjective/Complaints: Cramping/spasms better last night although still present. Therapy went fairly well yesterday.   Objective: Vital Signs: Blood pressure 117/53, pulse 76, temperature 97.6 F (36.4 C), temperature source Oral, resp. rate 17, height 6\' 1"  (1.854 m), weight 120.6 kg (265 lb 14 oz), SpO2 96.00%. No results found.  Recent Labs  07/19/14 0526  WBC 7.3  HGB 12.7*  HCT 39.8  PLT 316    Recent Labs  07/19/14 0526 07/20/14 0445  NA 148* 143  K 3.9 3.7  CL 101 96  GLUCOSE 147* 190*  BUN 52* 51*  CREATININE 0.96 1.01  CALCIUM 9.5 9.0   CBG (last 3)   Recent Labs  07/20/14 1630 07/20/14 2123 07/21/14 0704  GLUCAP 97 190* 158*    Wt Readings from Last 3 Encounters:  07/20/14 120.6 kg (265 lb 14 oz)  07/18/14 119.8 kg (264 lb 1.8 oz)  03/13/14 131.09 kg (289 lb)    Physical Exam:  Constitutional: He is oriented to person, place, and time. He appears well-developed and well-nourished. Side lying in bed  HENT: dentition fair. Oral mucosa pink/moist  Head: Normocephalic and atraumatic.  Eyes: Conjunctivae are normal. Pupils are equal, round, and reactive to light.  Neck: Normal range of motion. Neck supple.  Cardiovascular: Normal rate and regular rhythm. No murmurs  Respiratory: Effort normal and breath sounds normal. No respiratory distress. He has wheezes in upper airway.  GI: Soft. Bowel sounds are normal. He exhibits no distension. There is no tenderness.  Musculoskeletal: He exhibits trace edema in both legs.. Has low back pain with minimal lower extremity movement and with bed mobility Neurological: He is alert and oriented to person, place, and time.  Speech slow. Tremors BUE. Able to follow basic commands without difficulty. Dysesthesias bilateral feet present but to a lesser extent.  CN exam normal.  extremity: Intrinsic atrophy bilateral hands.  Motor strength is 4 minus  bilateral grip 5 bilateral biceps and deltoid, 4/5 left triceps 4/5 right tricep, wrist extension 4+  2 minus hip flexion knee extension ankle dorsiflexion and plantarflexion  Sensation intact to light touch in bilateral upper and lower extremities except for decreased proprioception in bilateral feet.  Deep tendon reflexes 2+ bilateral knees trace bilateral biceps and brachioradialis and triceps, 1+ patella,achilles  Ankles without clonus  Skin: chronic changes on feet/ dry, scaly skin, small reddened area left buttock, small stage 2 right buttock. Back surgery site intact  Psych: he's withdrawn, flat. Non-anxious on my eval   Assessment/Plan: 1. Functional deficits secondary to deconditioning, hx of cervical myelopathy which require 3+ hours per day of interdisciplinary therapy in a comprehensive inpatient rehab setting. Physiatrist is providing close team supervision and 24 hour management of active medical problems listed below. Physiatrist and rehab team continue to assess barriers to discharge/monitor patient progress toward functional and medical goals. FIM: FIM - Bathing Bathing Steps Patient Completed: Chest;Right Arm;Left Arm;Abdomen;Front perineal area;Right upper leg;Left upper leg;Left lower leg (including foot);Right lower leg (including foot) Bathing: 4: Min-Patient completes 8-9 50f 10 parts or 75+ percent  FIM - Upper Body Dressing/Undressing Upper body dressing/undressing steps patient completed: Thread/unthread right sleeve of pullover shirt/dresss;Thread/unthread left sleeve of pullover shirt/dress;Put head through opening of pull over shirt/dress;Pull shirt over trunk Upper body dressing/undressing: 5: Set-up assist to: Obtain clothing/put away (in wheelchair at sinkside) FIM - Lower Body Dressing/Undressing Lower body dressing/undressing steps patient completed: Thread/unthread left pants leg;Don/Doff  left shoe;Fasten/unfasten left shoe;Thread/unthread right pants  leg Lower body dressing/undressing: 1: Two helpers  FIM - Toileting Toileting steps completed by patient: Adjust clothing after toileting Toileting: 2: Max-Patient completed 1 of 3 steps  FIM - Radio producer Devices: Research officer, trade union Transfers: 1-Two helpers  FIM - Control and instrumentation engineer Devices: Sliding board Bed/Chair Transfer: 1: Two helpers;1: Sit > Supine: Total A (helper does all/Pt. < 25%);1: Chair or W/C > Bed: Total A (helper does all/Pt. < 25%)  FIM - Locomotion: Wheelchair Distance: 95' + 50' Locomotion: Wheelchair: 2: Travels 50 - 149 ft with supervision, cueing or coaxing FIM - Locomotion: Ambulation Locomotion: Ambulation Assistive Devices: Sara Plus Ambulation/Gait Assistance: 1: +2 Total assist Locomotion: Ambulation: 1: Two helpers  Comprehension Comprehension Mode: Auditory Comprehension: 4-Understands basic 75 - 89% of the time/requires cueing 10 - 24% of the time  Expression Expression Mode: Verbal Expression: 5-Expresses complex 90% of the time/cues < 10% of the time  Social Interaction Social Interaction: 5-Interacts appropriately 90% of the time - Needs monitoring or encouragement for participation or interaction.  Problem Solving Problem Solving: 2-Solves basic 25 - 49% of the time - needs direction more than half the time to initiate, plan or complete simple activities  Memory Memory Mode: Not assessed Memory: 5-Recognizes or recalls 90% of the time/requires cueing < 10% of the time  Medical Problem List and Plan:  1. Functional deficits secondary to Cervical myelopathy with deconditioning after pneumonia requiring intubation  2. DVT Prophylaxis/Anticoagulation: Pharmaceutical: Lovenox  3. Chronic Pain Management: Off Volatren and NSAIDS.   -oxycontin  -added baclofen for cramping/spasms which seems to have helped (stopped skelaxin)  -increased gabapentin for neruopathic  pain   -titrate meds further as needed 4. Mood: Monitor as mentation improves. Will have LCSW follow for evaluation and support.  5. Neuropsych: This patient is capable of making decisions on his own behalf.  6. 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  8. HTN: Continue metoprolol and lasix. Cards recommendations: if needed to add norvasc and resume ACE if renal status stable.   -added 5mg  norvasc with improvement 9. Acute on chronic diastolic CHF: Daily weights. Low salt diet. Patient non compliant at home--will have RD educate on appropriate diet.  10. Hypotension with AKI: BUN holding around 50 11. BPH: foley d/c'ed Off cardura and ditropan at this time. Monitor voiding.  12. DM type 2 diet controlled:  Hgb A1c-7.0. BS poorly controlled due to stress/infection requiring lantus. Will monitor BS with ac/hs checks and start patient on oral agent. Will continue lantus for now---cbg's still elevated.  13. Insomnia with RLS: Continue Mirapex. Monitor sleep wake cycle.  14. Constipation:  bowel program.  LOS (Days) 3 A FACE TO FACE EVALUATION WAS PERFORMED  Kendale Rembold T 07/21/2014 7:57 AM

## 2014-07-21 NOTE — Progress Notes (Signed)
Occupational Therapy Session Note  Patient Details  Name: Travis [name] MRN: [id number] Date of Birth: 1946-01-02  Today's Date: 07/21/2014 Time: 1030-1130 Time Calculation (min): 60 min  Short Term Goals: Week 1:  OT Short Term Goal 1 (Week 1): Pt. will perform bathing , seated and standing, with Max A  in order to increase I in self care.  OT Short Term Goal 2 (Week 1): Pt. will engage in 10 minutes of functional activity without requiring rest break in order to increase endurance for occupational performance. OT Short Term Goal 3 (Week 1): Pt. will perform LB dressing with Max A in order to increase I with ADLs.  OT Short Term Goal 4 (Week 1): Pt. will perform supine to sit with Max A in order to  increase I  in bed mobility. OT Short Term Goal 5 (Week 1): Pt. will perform static sitting balance at EOB with feet supported and supervision for 2 or more minutes in order to increase balance for occupational performance.   Skilled Therapeutic Interventions/Progress Updates: Therapeutic activities with focus on improved slide board transfers, sit<>stand, general endurance and strengthening, and static standing.  Pt received seated in w/c awaiting therapist.   Pt reported moderate discomfort from TEDs and was measured for correct fit; re-educated on visual inspection of fit and position of TEDs.   Pt performed slide board transfer in/out of bed with mod verbal cues for technique although he correctly stated steps for transfer to include placing board under thigh versus hip.   Pt performs partial squat with forward leans during transfer and but improved with each transfer, learning to scoot laterally with additional transfers in gym on/off raised mat.   Pt was educated on benefit of stand/sit exercises to increase LE strength and to improve standing tolerance, using raised mat incrementally adjusted higher until pt could stand unassisted from sitting position.   Pt completed unassisted stand at 25"  height this session, 5 additional stand>sit from lower heights (24", 23" 22") with mod-min assist, and transferred back to w/c using slide board with setup assist to place board under thigh d/t fatigue.   Pt was escorted back to room in w/c to await noon meal with call light placed within reach.     Therapy Documentation Precautions:  Precautions Precautions: Fall Precaution Comments: chronic back pain - performs bending with no B,L,T and log rolls Restrictions Weight Bearing Restrictions: No  Pain: Pain Assessment Pain Assessment: 0/10 scale Pain Score: 0-No pain Pain Type: Chronic pain Pain Location: Back Pain Orientation: Mid Pain Descriptors / Indicators: Aching Pain Onset: With Activity Patients Stated Pain Goal: 0 Pain Intervention(s):  (Pt pre-medicated prior to session)  ADL: ADL ADL Comments: see FIM  See FIM for current functional status  Therapy/Group: Individual Therapy  [place] 07/21/2014, 12:54 PM

## 2014-07-21 NOTE — IPOC Note (Addendum)
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   Abnormality of gait   Lumbosacral spinal stenosis   HTN (hypertension)   OSA on CPAP     Functional Problem List: Nursing Bowel;Pain;Safety  PT Balance;Edema;Endurance;Motor;Sensory;Safety;Pain  OT Balance;Endurance;Pain;Other (Comment) (strength)  SLP    TR Activity tolerance, functional mobility, balance, cognition, safety, pain       Basic ADL's: OT Grooming;Bathing;Dressing;Toileting     Advanced  ADL's: OT       Transfers: PT Bed Mobility;Bed to Chair;Car;Furniture  OT Toilet;Tub/Shower     Locomotion: PT Ambulation;Wheelchair Mobility;Stairs     Additional Impairments: OT    SLP        TR  leisure awareness    Anticipated Outcomes Item Anticipated Outcome  Self Feeding    Swallowing      Basic self-care  min A  Toileting  Min A   Bathroom Transfers Min A   Bowel/Bladder  Mod I  Transfers  Supervision   Locomotion  min A with RW short distances  Communication     Cognition     Pain  <3  Safety/Judgment  Supervision   Therapy Plan: PT Intensity: Minimum of 1-2 x/day ,45 to 90 minutes PT Frequency: 5 out of 7 days PT Duration Estimated Length of Stay: 21-24 days OT Frequency: 5 out of 7 days OT Duration/Estimated Length of Stay: 3 weeks TR Duration/ELOS:  3 weeks TR Frequency:  Min 1 time per week >20 minutes      Team Interventions: Nursing Interventions Pain Management;Psychosocial Support;Patient/Family Education  PT interventions Ambulation/gait training;Balance/vestibular training;Community reintegration;Discharge planning;Cognitive remediation/compensation;DME/adaptive equipment instruction;Neuromuscular re-education;Functional  mobility training;Pain management;Patient/family education;Psychosocial support;Stair training;Therapeutic Exercise;Therapeutic Activities;UE/LE Strength taining/ROM;UE/LE Coordination activities;Wheelchair propulsion/positioning  OT Interventions Patient/family education;Self Care/advanced ADL retraining;Therapeutic Exercise;Functional mobility training;Pain management;Therapeutic Activities;UE/LE Strength taining/ROM  SLP Interventions    TR Interventions  Recreation/leisure participation, Balance/Vestibular training, functional mobility, therapeutic activities, UE/LE strength/coordination, w/c mobility, community reintegration, pt/family education, adaptive equipment instruction/use, discharge planning, psychosocial support  SW/CM Interventions Discharge Planning;Psychosocial Support;Patient/Family Education    Team Discharge Planning: Destination: PT-Home ,OT- Home , SLP-  Projected Follow-up: PT-Home health PT;24 hour supervision/assistance, OT-  24 hour supervision/assistance;Home health OT, SLP-  Projected Equipment Needs: PT-To be determined;Wheelchair cushion (measurements);Wheelchair (measurements), OT- Tub/shower seat, SLP-  Equipment Details: PT-Pt already owns a walker and a cane, OT-Pt. reports owning SPC, RW, elevated toilet with 2 grab bars Patient/family involved in discharge planning: PT- Patient,  OT-Patient, SLP-   MD ELOS: 13-18d Medical Rehab Prognosis:  Good Assessment: 68 y.o. 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 CXR with evidence of pneumonia and patient with progressive respiratory failure requiring intubation past admission. He was started on IV antibiotics for sepsis as well as IVF bolus for hypotension. Hospital course complicated by AKI due to ATN, agitation as well as intermittent fevers. Renal ultrasound normal without hydronephrosis. He  completed antibiotics course was extubated on 07/19 without difficulty. Renal status slowly improving with increase in UOP. Has had SOB that has improved with diuresis. 2D echo with normal LVF. Cardiology consulted for input on CHF and recommended continuing IV diuresis with question of acute on chronic CHF with  4 L output  Now requiring 24/7 Rehab RN,MD, as well as CIR level PT, OT .  Treatment team will focus on ADLs and mobility with goals set at [facility]   See Team Conference Notes for weekly updates to the plan of care

## 2014-07-22 ENCOUNTER — Inpatient Hospital Stay (HOSPITAL_COMMUNITY): Payer: Medicare Other | Admitting: Physical Therapy

## 2014-07-22 ENCOUNTER — Inpatient Hospital Stay (HOSPITAL_COMMUNITY): Payer: Medicare Other | Admitting: Occupational Therapy

## 2014-07-22 DIAGNOSIS — I5031 Acute diastolic (congestive) heart failure: Secondary | ICD-10-CM

## 2014-07-22 DIAGNOSIS — Z5189 Encounter for other specified aftercare: Secondary | ICD-10-CM

## 2014-07-22 DIAGNOSIS — R5381 Other malaise: Secondary | ICD-10-CM

## 2014-07-22 DIAGNOSIS — J189 Pneumonia, unspecified organism: Secondary | ICD-10-CM

## 2014-07-22 DIAGNOSIS — M4712 Other spondylosis with myelopathy, cervical region: Secondary | ICD-10-CM

## 2014-07-22 DIAGNOSIS — A419 Sepsis, unspecified organism: Secondary | ICD-10-CM

## 2014-07-22 LAB — GLUCOSE, CAPILLARY
Glucose-Capillary: 151 mg/dL — ABNORMAL HIGH (ref 70–99)
Glucose-Capillary: 195 mg/dL — ABNORMAL HIGH (ref 70–99)
Glucose-Capillary: 203 mg/dL — ABNORMAL HIGH (ref 70–99)

## 2014-07-22 MED ORDER — PRAMIPEXOLE DIHYDROCHLORIDE 0.25 MG PO TABS
0.5000 mg | ORAL_TABLET | Freq: Two times a day (BID) | ORAL | Status: DC
  Administered 2014-07-22 – 2014-08-09 (×36): 0.5 mg via ORAL
  Filled 2014-07-22 (×38): qty 2

## 2014-07-22 NOTE — Progress Notes (Signed)
Physical Therapy Session Note  Patient Details  Name: Travis [name] MRN: [id number] Date of Birth: Jan 10, 1946  Today's Date: 07/22/2014 Time: 0930-1030 and [id number] Time Calculation (min): 60 min and 30 min  Short Term Goals: Week 1:  PT Short Term Goal 1 (Week 1): Pt will demonstrate log rolling in bed with rail req min A.  PT Short Term Goal 2 (Week 1): Pt will demonstrate side lie to sit transfer with rail req mod A.  PT Short Term Goal 3 (Week 1): Pt will demonstrate sit to stand with RW req max A.  PT Short Term Goal 4 (Week 1): Pt will demonstrate w/c propulsion x 100' req SBA.  PT Short Term Goal 5 (Week 1): Pt will demonstrate ambulation in // bars x 10' req mod A.   Skilled Therapeutic Interventions/Progress Updates:   AM Session: Pt received in bedside recliner, reporting increased back stiffness this date. Pt requires +2A for squat pivot transfer chair > mat to clear recliner armrest. Pt performed gentle stretching in seated position with anterior and lateral reaches, gentle trunk rotation, and back extension. Pt transferred sit <> supine with mod-max A. In supine, performed gentle active assisted lower trunk rotation with knees bent. Passive ROM for BLE hamstrings, hip external/internal rotators, and hip adductors. Pt reporting improvement in back pain (unrated) and increased comfort with mobility. Pt performed slide board transfer mat > w/c with min A/setup. W/c mobility using BUEs x 150 ft with supervision. Patient left sitting in w/c with all needs within reach. 02 sats monitored throughout session >93% on RA, RN notified of pt vitals and left on RA.    PM Session: Pt received sitting in w/c with ice packs applied to back, handoff from OT. Pt on RA throughout session with 02 sats WFL. Pt reporting improvement in back pain vs AM session but continued c/o stiffness, performed gentle soft tissue mobilization, passive pec stretch with overpressure, and instructed patient in scapular  retraction for upright posture and decreased rounded shoulders positioning. Pt performed slide board transfer w/c > mat with overall min guard/setup. Focus on sit <> stand from 22 inch mat table with use of EVA walker and mod A x 1. Pt able to maintain standing up to 2-3 min at a time with vc's for improved upright posture and hip extension to neutral before feeling that knees were about to "buckle" and requiring seated rest. Pt requesting to attempt transfer without slideboard to return to w/c. Scoot transfer mat > w/c with overall supervision. Pt reporting need for bathroom but upon returning to room, no longer with urgency. Pt reporting that he has not gotten on toilet yet, discussed that patient should ask to work on scoot transfers with primary OT with drop arm commode as he has made excellent progress with transfers at this time. RN tech notified of patient improvement with transfers, will defer to primary PT to update patient's safety plan. Pt left sitting in w/c with ice packs applied to low back and all needs within reach.   Therapy Documentation Precautions:  Precautions Precautions: Fall Precaution Comments: chronic back pain - performs bending with no B,L,T and log rolls Restrictions Weight Bearing Restrictions: No  See FIM for current functional status  Therapy/Group: Individual Therapy  Laretta Alstrom 07/22/2014, 10:33 AM

## 2014-07-22 NOTE — Progress Notes (Signed)
Pt refused CPAP.  Pt informed to notify Care Team should he change his mind.

## 2014-07-22 NOTE — Progress Notes (Signed)
Occupational Therapy Session Note  Patient Details  Name: Travis [name] MRN: [id number] Date of Birth: [birth year]  Today's Date: 07/22/2014 Time: 1410-1455 Time Calculation (min): 45 min   Skilled Therapeutic Interventions/Progress Updates: applied ice packs to patient as he c/o upper mid back pain all across laterally.   He was able to complete w/c propulsion with rest breaks and SCIFIT level 6.5 for 20 minutes.      Therapy Documentation Precautions:  Precautions Precautions: Fall Precaution Comments: chronic back pain - performs bending with no B,L,T and log rolls Restrictions Weight Bearing Restrictions: No   Pain:across upper mid back laterally "constant nagging hurt" pain level varies reportedly     See FIM for current functional status  Therapy/Group: Individual Therapy  Alfredia Ferguson [facility] 07/22/2014, 2:54 PM

## 2014-07-22 NOTE — Progress Notes (Signed)
Occupational Therapy Session Note  Patient Details  Name: Travis [name] MRN: [id number] Date of Birth: [birth year]  Today's Date: 07/22/2014 Time: 0730-0830 Time Calculation (min): 60 min  Short Term Goals: Week 2:  OT Short Term Goal 1 (Week 2): Pt. will perform bathing , seated and standing, with Mod A  in order to increase I in self care.  OT Short Term Goal 2 (Week 2): Pt. will engage in 20 minutes of functional activity without requiring rest break in order to increase endurance for occupational performance. OT Short Term Goal 3 (Week 2): Pt. will perform LB dressing with Mod A in order to increase I with ADLs.  OT Short Term Goal 4 (Week 2): Pt. will perform dynamic sitting balance at EOB with feet supported and Min A for 5 or more minutes in order to increase balance for occupational performance.  OT Short Term Goal 5 (Week 2): Pt. will perform supine to sit with min A in order to  increase I  in bed mobility.  Skilled Therapeutic Interventions/Progress Updates: patient with O2 applied, sitting EOB, eating breakfast and occasionally leaning back & supporting self  with both UEs (he stated due to he was too tired to keep sitting upright without something to lean against).  Patient completed bathing EOB except lateral leans for pericleansing and changing brief.     Therapy Documentation Precautions:  Precautions Precautions: Fall Precaution Comments: chronic back pain - performs bending with no B,L,T and log rolls Restrictions Weight Bearing Restrictions: No   Pain:denied  See FIM for current functional status  Therapy/Group: Individual Therapy  Alfredia Ferguson [facility] 07/22/2014, 8:17 AM

## 2014-07-22 NOTE — Progress Notes (Signed)
[place] PHYSICAL MEDICINE & REHABILITATION     PROGRESS NOTE    Subjective/Complaints: Cramping/spasms better last night although still present. Legs jumping at noc when he drifts off to sleep  Objective: Vital Signs: Blood pressure 155/94, pulse 78, temperature 98 F (36.7 C), temperature source Oral, resp. rate 18, height 6\' 1"  (1.854 m), weight 118.9 kg (262 lb 2 oz), SpO2 98.00%. No results found. No results found for this basename: WBC, HGB, HCT, PLT,  in the last 72 hours  Recent Labs  07/20/14 0445  NA 143  K 3.7  CL 96  GLUCOSE 190*  BUN 51*  CREATININE 1.01  CALCIUM 9.0   CBG (last 3)   Recent Labs  07/21/14 1657 07/21/14 2106 07/22/14 0722  GLUCAP 117* 207* 151*    Wt Readings from Last 3 Encounters:  07/22/14 118.9 kg (262 lb 2 oz)  07/18/14 119.8 kg (264 lb 1.8 oz)  03/13/14 131.09 kg (289 lb)    Physical Exam:  Constitutional: He is oriented to person, place, and time. He appears well-developed and well-nourished. Side lying in bed  HENT: dentition fair. Oral mucosa pink/moist  Head: Normocephalic and atraumatic.  Eyes: Conjunctivae are normal. Pupils are equal, round, and reactive to light.  Neck: Normal range of motion. Neck supple.  Cardiovascular: Normal rate and regular rhythm. No murmurs  Respiratory: Effort normal and breath sounds normal. No respiratory distress. He has wheezes in upper airway.  GI: Soft. Bowel sounds are normal. He exhibits no distension. There is no tenderness.  Musculoskeletal: He exhibits trace edema in both legs.. Has low back pain with minimal lower extremity movement and with bed mobility Neurological: He is alert and oriented to person, place, and time.  Speech slow. Tremors BUE. Able to follow basic commands without difficulty. Dysesthesias bilateral feet present but to a lesser extent.  CN exam normal.  extremity: Intrinsic atrophy bilateral hands.  Motor strength is 4 minus bilateral grip 5 bilateral biceps  and deltoid, 4/5 left triceps 4/5 right tricep, wrist extension 4+  2 minus hip flexion knee extension ankle dorsiflexion and plantarflexion  Sensation intact to light touch in bilateral upper and lower extremities except for decreased proprioception in bilateral feet.  Deep tendon reflexes 2+ bilateral knees trace bilateral biceps and brachioradialis and triceps, 1+ patella,achilles  Ankles without clonus  Skin: chronic changes on feet/ dry, scaly skin, small reddened area left buttock, small stage 2 right buttock. Back surgery site intact  Psych: he's withdrawn, flat. Non-anxious on my eval   Assessment/Plan: 1. Functional deficits secondary to deconditioning, hx of cervical myelopathy which require 3+ hours per day of interdisciplinary therapy in a comprehensive inpatient rehab setting. Physiatrist is providing close team supervision and 24 hour management of active medical problems listed below. Physiatrist and rehab team continue to assess barriers to discharge/monitor patient progress toward functional and medical goals. FIM: FIM - Bathing Bathing Steps Patient Completed: Abdomen;Left Arm;Right Arm;Chest;Right upper leg;Left upper leg Bathing: 3: Mod-Patient completes 5-7 45f 10 parts or 50-74% (utilized roll in shower chair for shower)  FIM - Upper Body Dressing/Undressing Upper body dressing/undressing steps patient completed: Thread/unthread left sleeve of pullover shirt/dress;Pull shirt over trunk;Put head through opening of pull over shirt/dress;Thread/unthread right sleeve of pullover shirt/dresss Upper body dressing/undressing: 5: Set-up assist to: Obtain clothing/put away FIM - Lower Body Dressing/Undressing Lower body dressing/undressing steps patient completed:  (A with all parts of LB dressing with pt. completing less than 25% of tasks.) Lower body dressing/undressing: 1: Two  helpers  FIM - Psychologist, occupational steps completed by patient: Development worker, community after  toileting Toileting: 2: Max-Patient completed 1 of 3 steps  FIM - Radio producer Devices: Research officer, trade union Transfers: 1-Two helpers  FIM - Control and instrumentation engineer Devices: Sliding board Bed/Chair Transfer: 3: Supine > Sit: Mod A (lifting assist/Pt. 50-74%/lift 2 legs;1: Two helpers  FIM - Locomotion: Wheelchair Distance: 95' + 50' Locomotion: Wheelchair: 1: Total Assistance/staff pushes wheelchair (Pt<25%) FIM - Locomotion: Ambulation Locomotion: Ambulation Assistive Devices: Sara Plus Ambulation/Gait Assistance: 1: +2 Total assist Locomotion: Ambulation: 0: Activity did not occur  Comprehension Comprehension Mode: Auditory Comprehension: 5-Understands complex 90% of the time/Cues < 10% of the time  Expression Expression Mode: Verbal Expression: 5-Expresses complex 90% of the time/cues < 10% of the time  Social Interaction Social Interaction: 5-Interacts appropriately 90% of the time - Needs monitoring or encouragement for participation or interaction.  Problem Solving Problem Solving: 2-Solves basic 25 - 49% of the time - needs direction more than half the time to initiate, plan or complete simple activities  Memory Memory Mode: Not assessed Memory: 5-Recognizes or recalls 90% of the time/requires cueing < 10% of the time  Medical Problem List and Plan:  1. Functional deficits secondary to Cervical myelopathy with deconditioning after pneumonia requiring intubation  2. DVT Prophylaxis/Anticoagulation: Pharmaceutical: Lovenox  3. Chronic Pain Management: Off Volatren and NSAIDS.   -oxycontin  -added baclofen for cramping/spasms which seems to have helped (stopped skelaxin)  -increased gabapentin for neuropathic pain   -titrate meds further as needed 4. Mood: Monitor as mentation improves. Will have LCSW follow for evaluation and support.  5. Neuropsych: This patient is capable of making decisions on  his own behalf.  6. 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  8. HTN: Continue metoprolol and lasix. Cards recommendations: if needed to add norvasc and resume ACE if renal status stable.   -added 5mg  norvasc with improvement 9. Acute on chronic diastolic CHF: Daily weights. Low salt diet. Patient non compliant at home--will have RD educate on appropriate diet.  10. Hypotension with AKI: BUN holding around 50 11. BPH: foley d/c'ed Off cardura and ditropan at this time. Monitor voiding.  12. DM type 2 diet controlled:  Hgb A1c-7.0. BS poorly controlled due to stress/infection requiring lantus. Will monitor BS with ac/hs checks and start patient on oral agent. Will continue lantus for now---cbg's still elevated.  13. Insomnia with RLS: Continue Mirapex. Monitor sleep wake cycle.  14. Constipation:  bowel program.  LOS (Days) 4 A FACE TO FACE EVALUATION WAS PERFORMED  Charlett Blake 07/22/2014 8:13 AM

## 2014-07-22 NOTE — Progress Notes (Signed)
Pt refusing Cpap tonight,pt states that it's uncomfortable I explained the importance of wearing it and informed pt to call if he changed his mind.

## 2014-07-23 ENCOUNTER — Inpatient Hospital Stay (HOSPITAL_COMMUNITY): Payer: Medicare Other

## 2014-07-23 LAB — GLUCOSE, CAPILLARY
GLUCOSE-CAPILLARY: 155 mg/dL — AB (ref 70–99)
GLUCOSE-CAPILLARY: 187 mg/dL — AB (ref 70–99)
Glucose-Capillary: 106 mg/dL — ABNORMAL HIGH (ref 70–99)
Glucose-Capillary: 227 mg/dL — ABNORMAL HIGH (ref 70–99)
Glucose-Capillary: 126 mg/dL — ABNORMAL HIGH (ref 70–99)

## 2014-07-23 NOTE — Progress Notes (Signed)
Physical Therapy Session Note  Patient Details  Name: Travis [name] MRN: [id number] Date of Birth: [birth year]  Today's Date: 07/23/2014 Time: [id number] Time Calculation (min): 50 min  Short Term Goals: Week 1:  PT Short Term Goal 1 (Week 1): Pt will demonstrate log rolling in bed with rail req min A.  PT Short Term Goal 2 (Week 1): Pt will demonstrate side lie to sit transfer with rail req mod A.  PT Short Term Goal 3 (Week 1): Pt will demonstrate sit to stand with RW req max A.  PT Short Term Goal 4 (Week 1): Pt will demonstrate w/c propulsion x 100' req SBA.  PT Short Term Goal 5 (Week 1): Pt will demonstrate ambulation in // bars x 10' req mod A.   Skilled Therapeutic Interventions/Progress Updates:    Pt received seated in w/c, agreeable to participate in therapy. Session focused on squat pivot transfer training and bed mobility. Pt propelled w/c 150' to rehab gym w/ supervision. In rehab gym, educated pt on sequence for squat pivot transfer. Pt moved to L and R mat <> w/c at [facility] initially, then MinA w/ cues for foot placement and sequencing. During transfer training therapist noted pt's shorts were wet. Unclear if pt was incontinent or shorts became wet from pt using urinal while seated in w/c. Wheeled pt back to room and performed squat pivot transfer w/c>bed. Pt undressed LB w/ MinA while seated EOB w/ technique of moving sit<>prop on L and R elbow. Pt dressed LB w/ MaxA while seated EOB w/ same technique. Pt moved sit>supine w/ ModA for managing BLE, then rolled L and R w/ MinA to complete dressing and place chuck pad. Pt very fatigued after dressing, requested to finish session early. Pt left supine in bed w/ all needs within reach.  Therapy Documentation Precautions:  Precautions Precautions: Fall Precaution Comments: chronic back pain - performs bending with no B,L,T and log rolls Restrictions Weight Bearing Restrictions: No General: Amount of Missed PT Time (min): 10  Minutes Missed Time Reason: Patient fatigue Pain: Pain Assessment Pain Assessment: No/denies pain Pain Score: 0-No pain  See FIM for current functional status  Therapy/Group: Individual Therapy  Rada Hay Rada Hay, PT, DPT 07/23/2014, 12:53 PM

## 2014-07-23 NOTE — Plan of Care (Signed)
Problem: RH SKIN INTEGRITY Goal: RH STG SKIN FREE OF INFECTION/BREAKDOWN Outcome: Progressing Pt. Turns and repositions self every hour.  Skin breakdown on buttocks improving and healing.

## 2014-07-23 NOTE — Progress Notes (Signed)
[place] PHYSICAL MEDICINE & REHABILITATION     PROGRESS NOTE    Subjective/Complaints: Cramping/spasms better last night although still present. Legs jumping at noc when he drifts off to sleep No issues overnite  Objective: Vital Signs: Blood pressure 132/74, pulse 73, temperature 97.6 F (36.4 C), temperature source Oral, resp. rate 18, height 6\' 1"  (1.854 m), weight 118.9 kg (262 lb 2 oz), SpO2 96.00%. No results found. No results found for this basename: WBC, HGB, HCT, PLT,  in the last 72 hours No results found for this basename: NA, K, CL, CO, GLUCOSE, BUN, CREATININE, CALCIUM,  in the last 72 hours CBG (last 3)   Recent Labs  07/22/14 1619 07/22/14 2122 07/23/14 0736  GLUCAP 106* 203* 155*    Wt Readings from Last 3 Encounters:  07/22/14 118.9 kg (262 lb 2 oz)  07/18/14 119.8 kg (264 lb 1.8 oz)  03/13/14 131.09 kg (289 lb)    Physical Exam:  Constitutional: He is oriented to person, place, and time. He appears well-developed and well-nourished. Side lying in bed  HENT: dentition fair. Oral mucosa pink/moist  Head: Normocephalic and atraumatic.  Eyes: Conjunctivae are normal. Pupils are equal, round, and reactive to light.  Neck: Normal range of motion. Neck supple.  Cardiovascular: Normal rate and regular rhythm. No murmurs  Respiratory: Effort normal and breath sounds normal. No respiratory distress. He has wheezes in upper airway.  GI: Soft. Bowel sounds are normal. He exhibits no distension. There is no tenderness.  Musculoskeletal: He exhibits trace edema in both legs.. Has low back pain with minimal lower extremity movement and with bed mobility Neurological: He is alert and oriented to person, place, and time.  Speech slow. Tremors BUE. Able to follow basic commands without difficulty. Dysesthesias bilateral feet present but to a lesser extent.  CN exam normal.  extremity: Intrinsic atrophy bilateral hands.  Motor strength is 4 minus bilateral grip 5  bilateral biceps and deltoid, 4/5 left triceps 4/5 right tricep, wrist extension 4+  2 minus hip flexion knee extension ankle dorsiflexion and plantarflexion  Sensation intact to light touch in bilateral upper and lower extremities except for decreased proprioception in bilateral feet.  Deep tendon reflexes 2+ bilateral knees trace bilateral biceps and brachioradialis and triceps, 1+ patella,achilles  Ankles without clonus  Skin: chronic changes on feet/ dry, scaly skin, small reddened area left buttock, small stage 2 right buttock. Back surgery site intact  Psych: he's withdrawn, flat. Non-anxious on my eval   Assessment/Plan: 1. Functional deficits secondary to deconditioning, hx of cervical myelopathy which require 3+ hours per day of interdisciplinary therapy in a comprehensive inpatient rehab setting. Physiatrist is providing close team supervision and 24 hour management of active medical problems listed below. Physiatrist and rehab team continue to assess barriers to discharge/monitor patient progress toward functional and medical goals. FIM: FIM - Bathing Bathing Steps Patient Completed: Chest;Right Arm;Left Arm;Abdomen;Front perineal area;Right upper leg;Left upper leg Bathing: 3: Mod-Patient completes 5-7 43f 10 parts or 50-74%  FIM - Upper Body Dressing/Undressing Upper body dressing/undressing steps patient completed: Thread/unthread right sleeve of pullover shirt/dresss;Thread/unthread left sleeve of pullover shirt/dress;Put head through opening of pull over shirt/dress;Pull shirt over trunk Upper body dressing/undressing: 5: Set-up assist to: Obtain clothing/put away FIM - Lower Body Dressing/Undressing Lower body dressing/undressing steps patient completed: Thread/unthread right underwear leg;Thread/unthread left underwear leg Lower body dressing/undressing: 2: Max-Patient completed 25-49% of tasks  FIM - Toileting Toileting steps completed by patient: Adjust clothing after  toileting Toileting: 0:  Activity did not occur  FIM - Radio producer Devices: Bedside commode;Sliding board Toilet Transfers: 0-Activity did not occur  FIM - Control and instrumentation engineer Devices: Sliding board Bed/Chair Transfer: 3: Sit > Supine: Mod A (lifting assist/Pt. 50-74%/lift 2 legs);2: Supine > Sit: Max A (lifting assist/Pt. 25-49%);4: Bed > Chair or W/C: Min A (steadying Pt. > 75%);4: Chair or W/C > Bed: Min A (steadying Pt. > 75%)  FIM - Locomotion: Wheelchair Distance: 150 ft Locomotion: Wheelchair: 5: Travels 150 ft or more: maneuvers on rugs and over door sills with supervision, cueing or coaxing FIM - Locomotion: Ambulation Locomotion: Ambulation Assistive Devices: Sara Plus Ambulation/Gait Assistance: 1: +2 Total assist Locomotion: Ambulation: 0: Activity did not occur  Comprehension Comprehension Mode: Auditory Comprehension: 5-Understands complex 90% of the time/Cues < 10% of the time  Expression Expression Mode: Verbal Expression: 5-Expresses complex 90% of the time/cues < 10% of the time  Social Interaction Social Interaction: 6-Interacts appropriately with others with medication or extra time (anti-anxiety, antidepressant).  Problem Solving Problem Solving: 4-Solves basic 75 - 89% of the time/requires cueing 10 - 24% of the time  Memory Memory Mode: Not assessed Memory: 5-Recognizes or recalls 90% of the time/requires cueing < 10% of the time  Medical Problem List and Plan:  1. Functional deficits secondary to Cervical myelopathy with deconditioning after pneumonia requiring intubation  2. DVT Prophylaxis/Anticoagulation: Pharmaceutical: Lovenox  3. Chronic Pain Management: Off Volatren and NSAIDS.   -oxycontin  -added baclofen for cramping/spasms which seems to have helped (stopped skelaxin)  -increased gabapentin for neuropathic pain   -titrate meds further as needed 4. Mood: Monitor as mentation  improves. Will have LCSW follow for evaluation and support.  5. Neuropsych: This patient is capable of making decisions on his own behalf.  6. 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  8. HTN: Continue metoprolol and lasix. Cards recommendations: if needed to add norvasc and resume ACE if renal status stable.   -added 5mg  norvasc with improvement 9. Acute on chronic diastolic CHF: Daily weights. Low salt diet. Patient non compliant at home--will have RD educate on appropriate diet.  10. Hypotension with AKI: BUN holding around 50 11. BPH: foley d/c'ed Off cardura and ditropan at this time. Monitor voiding.  12. DM type 2 diet controlled:  Hgb A1c-7.0. BS poorly controlled due to stress/infection requiring lantus. Will monitor BS with ac/hs checks and start patient on oral agent. Will continue lantus for now---cbg's still elevated.  13. Insomnia with RLS: Continue Mirapex. Monitor sleep wake cycle.  14. Constipation:  bowel program.  LOS (Days) 5 A FACE TO FACE EVALUATION WAS PERFORMED  Charlett Blake 07/23/2014 8:34 AM

## 2014-07-24 ENCOUNTER — Inpatient Hospital Stay (HOSPITAL_COMMUNITY): Payer: Medicare Other | Admitting: Occupational Therapy

## 2014-07-24 ENCOUNTER — Inpatient Hospital Stay (HOSPITAL_COMMUNITY): Payer: Medicare Other

## 2014-07-24 DIAGNOSIS — M4712 Other spondylosis with myelopathy, cervical region: Secondary | ICD-10-CM

## 2014-07-24 DIAGNOSIS — A419 Sepsis, unspecified organism: Secondary | ICD-10-CM

## 2014-07-24 DIAGNOSIS — J189 Pneumonia, unspecified organism: Secondary | ICD-10-CM

## 2014-07-24 DIAGNOSIS — R5381 Other malaise: Secondary | ICD-10-CM

## 2014-07-24 DIAGNOSIS — Z5189 Encounter for other specified aftercare: Secondary | ICD-10-CM

## 2014-07-24 DIAGNOSIS — I5031 Acute diastolic (congestive) heart failure: Secondary | ICD-10-CM

## 2014-07-24 LAB — GLUCOSE, CAPILLARY
GLUCOSE-CAPILLARY: 144 mg/dL — AB (ref 70–99)
GLUCOSE-CAPILLARY: 155 mg/dL — AB (ref 70–99)
GLUCOSE-CAPILLARY: 178 mg/dL — AB (ref 70–99)
Glucose-Capillary: 140 mg/dL — ABNORMAL HIGH (ref 70–99)

## 2014-07-24 MED ORDER — INSULIN GLARGINE 100 UNIT/ML ~~LOC~~ SOLN
10.0000 [IU] | Freq: Every day | SUBCUTANEOUS | Status: DC
  Administered 2014-07-24 – 2014-07-26 (×3): 10 [IU] via SUBCUTANEOUS
  Filled 2014-07-24 (×4): qty 0.1

## 2014-07-24 MED ORDER — GLIMEPIRIDE 4 MG PO TABS
4.0000 mg | ORAL_TABLET | Freq: Every day | ORAL | Status: DC
  Administered 2014-07-25 – 2014-08-09 (×16): 4 mg via ORAL
  Filled 2014-07-24 (×17): qty 1

## 2014-07-24 NOTE — Progress Notes (Addendum)
Pt refused CPAP.  Again Pt reminded of importance of regular use and notified to contact care team should he change his mind.

## 2014-07-24 NOTE — Progress Notes (Signed)
Recreational Therapy Session Note  Patient Details  Name: Travis [name] MRN: [id number] Date of Birth: [birth year] Today's Date: 07/24/2014  Attempted eval completion today.  Pt with c/o fatigue & requested to post-pone session til tomorrow.  Full eval to follow.  [place] 07/24/2014, 3:50 PM

## 2014-07-24 NOTE — Progress Notes (Signed)
Physical Therapy Session Note  Patient Details  Name: Travis [name] MRN: [id number] Date of Birth: 01/25/1946  Today's Date: 07/24/2014 Time: 1400-1450 Time Calculation (min): 50 min  Short Term Goals: Week 1:  PT Short Term Goal 1 (Week 1): Pt will demonstrate log rolling in bed with rail req min A.  PT Short Term Goal 2 (Week 1): Pt will demonstrate side lie to sit transfer with rail req mod A.  PT Short Term Goal 3 (Week 1): Pt will demonstrate sit to stand with RW req max A.  PT Short Term Goal 4 (Week 1): Pt will demonstrate w/c propulsion x 100' req SBA.  PT Short Term Goal 5 (Week 1): Pt will demonstrate ambulation in // bars x 10' req mod A.   Skilled Therapeutic Interventions/Progress Updates:    Pt received seated in w/c, agreeable to participate in therapy. Pt propelled w/c 150' to rehab gym. Pt w/ squat pivot w/ ModA to R w/c>mat table, maintained seated EOM w/ feet supported with close S. Pt performed x4 sit<>stands w/ use of EVA walker, variable assist to remain standing from CGA to Mod. Pt frequently buckling on B knees, some episodes requiring ModA to correct. Pt able to stand up to 2-3 minutes at a time before requiring rest break. Pt required extended rest breaks, very fatigued from activity. In standing pt completed x20 mini-marches and weight shift w/ ModA L to R. Pt moved sit>supine w/ ModA, completed x10 glute sets in hook lying. Pt moved supine>sit w/ log roll technique, required MaxA to move from R sidelying to sit. Pt expressed need for bathroom, ModA for squat pivot back to w/c, Total A for w/c propulsion back to room. Squat pivot transfer w/ ModA to [facility] w/ nurse tech stabilizing BSC. Pt found to be incontinent of bladder, assisted pt with cleanup. While nurse tech was with pt, therapist obtained bariatric STEDI lift for safer transfer. Updated room care sign, recommend pt only utilize squat pivot transfer with therapists, otherwise use Bariatric STEDI or +2 Assist for  sliding board transfer. Pt left in room w/ RN and NT present providing nursing care. Pt missed 10 minutes of scheduled PT time due to nursing care.  Therapy Documentation Precautions:  Precautions Precautions: Fall Precaution Comments: chronic back pain - performs bending with no B,L,T and log rolls Restrictions Weight Bearing Restrictions: No General: Amount of Missed PT Time (min): 10 Minutes Missed Time Reason: Nursing care;Patient fatigue (Pt toileting, receiving nursing care) Vital Signs: Therapy Vitals Temp: 97.4 F (36.3 C) Temp src: Oral Pulse Rate: 62 Resp: 20 BP: 162/80 mmHg Patient Position (if appropriate): Sitting Oxygen Therapy SpO2: 93 % O2 Device: None (Room air) Pain: Pain Assessment Pain Assessment: Faces Faces Pain Scale: Hurts even more Pain Type: Acute pain Pain Location: Back Pain Orientation: Mid Pain Descriptors / Indicators: Aching Pain Onset: With Activity Pain Intervention(s): Rest (Pt pre-medicated prior to session) Mobility:   Locomotion :    Trunk/Postural Assessment :    Balance:   Exercises:   Other Treatments:    See FIM for current functional status  Therapy/Group: Individual Therapy  Rada Hay Rada Hay, PT, DPT 07/24/2014, 5:56 PM

## 2014-07-24 NOTE — Progress Notes (Signed)
Occupational Therapy Session Note  Patient Details  Name: Travis [name] MRN: [id number] Date of Birth: 1946/02/16  Today's Date: 07/24/2014 Time: 1101-1201 and 1255 -1355 Time Calculation (min): 60 min and 60 min  Short Term Goals: Week 1:  OT Short Term Goal 1 (Week 1): Pt. will perform bathing , seated and standing, with Max A  in order to increase I in self care.  OT Short Term Goal 2 (Week 1): Pt. will engage in 10 minutes of functional activity without requiring rest break in order to increase endurance for occupational performance. OT Short Term Goal 3 (Week 1): Pt. will perform LB dressing with Max A in order to increase I with ADLs.  OT Short Term Goal 4 (Week 1): Pt. will perform supine to sit with Max A in order to  increase I  in bed mobility. OT Short Term Goal 5 (Week 1): Pt. will perform static sitting balance at EOB with feet supported and supervision for 2 or more minutes in order to increase balance for occupational performance.   Skilled Therapeutic Interventions/Progress Updates: Session 1: Upon entering the room, pt in recliner chair and therapist utilized bariatric steady to have pt stand with Max A of 1 in order to transfer to roll in shower chair. While seated in wheelchair pt performed lateral leans with verbal cues in order to remove underwear and elastic waist pants. Once in shower, provided vcs pt performed lateral leans in order to wash buttocks. Pt required assistance to wash B lower feet secondary to fatigue at end of shower time. Pt pulls self up from elevated seat of steady with Min A for dynamic standing balance in order to pull up pants and underwear. Verbal cues and manual facilitation for body posture when standing. Pt unable to recall energy conservation techniques from previous session. OT continues to educate pt on concepts as well as encouragement for pursed lip breathing.   Session 2: Pt seated in wheelchair upon entering the room. Pt required Max A to get  into steady from wheelchair height. OT educated pt on standing posture as well as utilizing forward lean with B LE and UE to assist in pushing up into a stand. Pt demonstrated sit to stand x 8 reps throughout session from elevated steady height with SBA progressing to Min A for steadying secondary to fatigue. Once standing in steady frame pt engaged in dynamic standing balance activities such as brushing teeth, combing hair, and pulling up/pushing down pants. Pt required SBA - Min A for balance during functional tasks. Pt standing for ~ 1-2 minutes each time standing during session. OT answered many questions pt had regarding POC and LOS. Pt recalled 2 energy conservation techniques from morning session.      Therapy Documentation Precautions:  Precautions Precautions: Fall Precaution Comments: chronic back pain - performs bending with no B,L,T and log rolls Restrictions Weight Bearing Restrictions: No Pain: Pain Assessment Pain Assessment: No/denies pain Pain Score: 0-No pain  See FIM for current functional status  Therapy/Group: Individual Therapy  [name] 07/24/2014, 12:33 PM

## 2014-07-24 NOTE — Progress Notes (Signed)
[place] PHYSICAL MEDICINE & REHABILITATION     PROGRESS NOTE    Subjective/Complaints: Feels that the bed is preventing him from sleeping well. Sometimes cramping, too--- has felt sleepy in the mornings.     Objective: Vital Signs: Blood pressure 167/84, pulse 80, temperature 97.8 F (36.6 C), temperature source Oral, resp. rate 18, height 6\' 1"  (1.854 m), weight 119.1 kg (262 lb 9.1 oz), SpO2 93.00%. No results found. No results found for this basename: WBC, HGB, HCT, PLT,  in the last 72 hours No results found for this basename: NA, K, CL, CO, GLUCOSE, BUN, CREATININE, CALCIUM,  in the last 72 hours CBG (last 3)   Recent Labs  07/23/14 1635 07/23/14 2051 07/24/14 0730  GLUCAP 227* 126* 140*    Wt Readings from Last 3 Encounters:  07/24/14 119.1 kg (262 lb 9.1 oz)  07/18/14 119.8 kg (264 lb 1.8 oz)  03/13/14 131.09 kg (289 lb)    Physical Exam:  Constitutional: He is oriented to person, place, and time. He appears well-developed and well-nourished. Side lying in bed  HENT: dentition fair. Oral mucosa pink/moist  Head: Normocephalic and atraumatic.  Eyes: Conjunctivae are normal. Pupils are equal, round, and reactive to light.  Neck: Normal range of motion. Neck supple.  Cardiovascular: Normal rate and regular rhythm. No murmurs  Respiratory: Effort normal and breath sounds normal. No respiratory distress. He has wheezes in upper airway.  GI: Soft. Bowel sounds are normal. He exhibits no distension. There is no tenderness.  Musculoskeletal: He exhibits trace edema in both legs.. Has low back pain with minimal lower extremity movement and with bed mobility Neurological: He is alert and oriented to person, place, and time. Appears rested  Tremors BUE. Able to follow basic commands without difficulty. Dysesthesias bilateral feet present but to a lesser extent.  CN exam normal.  extremity: Intrinsic atrophy bilateral hands.  Motor strength is 4 minus bilateral grip 5  bilateral biceps and deltoid, 4/5 left triceps 4/5 right tricep, wrist extension 4+  2 minus hip flexion knee extension ankle dorsiflexion and plantarflexion  Sensation intact to light touch in bilateral upper and lower extremities except for decreased proprioception in bilateral feet.  Deep tendon reflexes 2+ bilateral knees trace bilateral biceps and brachioradialis and triceps, 1+ patella,achilles  Ankles without clonus  Skin: chronic changes on feet/ dry, scaly skin, small reddened area left buttock, small stage 2 right buttock. Back surgery site intact  Psych: pleasant, appropriate  Assessment/Plan: 1. Functional deficits secondary to deconditioning, hx of cervical myelopathy which require 3+ hours per day of interdisciplinary therapy in a comprehensive inpatient rehab setting. Physiatrist is providing close team supervision and 24 hour management of active medical problems listed below. Physiatrist and rehab team continue to assess barriers to discharge/monitor patient progress toward functional and medical goals.  FIM: FIM - Bathing Bathing Steps Patient Completed: Chest;Right Arm;Left Arm;Abdomen;Front perineal area;Right upper leg;Left upper leg Bathing: 3: Mod-Patient completes 5-7 68f 10 parts or 50-74%  FIM - Upper Body Dressing/Undressing Upper body dressing/undressing steps patient completed: Thread/unthread right sleeve of pullover shirt/dresss;Thread/unthread left sleeve of pullover shirt/dress;Put head through opening of pull over shirt/dress;Pull shirt over trunk Upper body dressing/undressing: 5: Set-up assist to: Obtain clothing/put away FIM - Lower Body Dressing/Undressing Lower body dressing/undressing steps patient completed: Thread/unthread right underwear leg;Thread/unthread left underwear leg Lower body dressing/undressing: 1: Total-Patient completed less than 25% of tasks  FIM - Toileting Toileting steps completed by patient: Adjust clothing after  toileting Toileting: 0: Activity  did not occur  FIM - Radio producer Devices: Research officer, trade union Transfers: 0-Activity did not occur  FIM - Control and instrumentation engineer Devices: Sliding board Bed/Chair Transfer: 3: Sit > Supine: Mod A (lifting assist/Pt. 50-74%/lift 2 legs);2: Bed > Chair or W/C: Max A (lift and lower assist);2: Chair or W/C > Bed: Max A (lift and lower assist)  FIM - Locomotion: Wheelchair Distance: 150 ft Locomotion: Wheelchair: 5: Travels 150 ft or more: maneuvers on rugs and over door sills with supervision, cueing or coaxing FIM - Locomotion: Ambulation Locomotion: Ambulation Assistive Devices: Sara Plus Ambulation/Gait Assistance: 1: +2 Total assist Locomotion: Ambulation: 0: Activity did not occur  Comprehension Comprehension Mode: Auditory Comprehension: 5-Understands complex 90% of the time/Cues < 10% of the time  Expression Expression Mode: Verbal Expression: 5-Expresses complex 90% of the time/cues < 10% of the time  Social Interaction Social Interaction: 6-Interacts appropriately with others with medication or extra time (anti-anxiety, antidepressant).  Problem Solving Problem Solving: 4-Solves basic 75 - 89% of the time/requires cueing 10 - 24% of the time  Memory Memory Mode: Not assessed Memory: 5-Recognizes or recalls 90% of the time/requires cueing < 10% of the time  Medical Problem List and Plan:  1. Functional deficits secondary to Cervical myelopathy with deconditioning after pneumonia requiring intubation  2. DVT Prophylaxis/Anticoagulation: Pharmaceutical: Lovenox  3. Chronic Pain Management: Off Volatren and NSAIDS.   -oxycontin  -added baclofen for cramping/spasms which seems to have helped (stopped skelaxin)  -increased gabapentin for neuropathic pain   -titrate meds further as needed 4. Mood: Monitor as mentation improves. Will have LCSW follow for evaluation and  support.  5. Neuropsych: This patient is capable of making decisions on his own behalf.  6. 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 HTN: Continue metoprolol and lasix. Cards recommendations: if needed to add norvasc and resume ACE if renal status stable.   -added 5mg  norvasc with improvement 9. Acute on chronic diastolic CHF: Daily weights. Low salt diet. Patient non compliant at home--will have RD educate on appropriate diet.  10. Hypotension with AKI: BUN holding around 50 11. BPH: foley d/c'ed Off cardura and ditropan at this time. Monitor voiding.  12. DM type 2 diet controlled:  Hgb A1c-7.0. BS poorly controlled due to stress/infection requiring lantus. Will monitor BS with ac/hs checks and start patient on oral agent. Will try to wean lantus as we increase amaryl.  13. Insomnia with RLS: Continue Mirapex. Monitor sleep wake cycle.  14. Constipation:  bowel program.  LOS (Days) 6 A FACE TO FACE EVALUATION WAS PERFORMED  Travis [name] T 07/24/2014 8:39 AM

## 2014-07-24 NOTE — Progress Notes (Signed)
Pt still refusing CPAP at this time. RT will continue to monitor.

## 2014-07-25 ENCOUNTER — Inpatient Hospital Stay (HOSPITAL_COMMUNITY): Payer: Medicare Other | Admitting: *Deleted

## 2014-07-25 ENCOUNTER — Inpatient Hospital Stay (HOSPITAL_COMMUNITY): Payer: Medicare Other

## 2014-07-25 ENCOUNTER — Inpatient Hospital Stay (HOSPITAL_COMMUNITY): Payer: Medicare Other | Admitting: Occupational Therapy

## 2014-07-25 DIAGNOSIS — A419 Sepsis, unspecified organism: Secondary | ICD-10-CM

## 2014-07-25 DIAGNOSIS — J189 Pneumonia, unspecified organism: Secondary | ICD-10-CM

## 2014-07-25 DIAGNOSIS — R5381 Other malaise: Secondary | ICD-10-CM

## 2014-07-25 DIAGNOSIS — Z5189 Encounter for other specified aftercare: Secondary | ICD-10-CM

## 2014-07-25 DIAGNOSIS — M4712 Other spondylosis with myelopathy, cervical region: Secondary | ICD-10-CM

## 2014-07-25 DIAGNOSIS — I5031 Acute diastolic (congestive) heart failure: Secondary | ICD-10-CM

## 2014-07-25 LAB — GLUCOSE, CAPILLARY
GLUCOSE-CAPILLARY: 163 mg/dL — AB (ref 70–99)
GLUCOSE-CAPILLARY: 118 mg/dL — AB (ref 70–99)
Glucose-Capillary: 66 mg/dL — ABNORMAL LOW (ref 70–99)
Glucose-Capillary: 77 mg/dL (ref 70–99)
Glucose-Capillary: 190 mg/dL — ABNORMAL HIGH (ref 70–99)

## 2014-07-25 NOTE — Progress Notes (Signed)
Recreational Therapy Assessment and Plan  Patient Details  Name: CAYDAN MCTAVISH MRN: [id number] Date of Birth: 1946-03-05 Today's Date: 07/25/2014  Rehab Potential: Good ELOS: 3 weeks   Assessment Clinical Impression:Problem List:  Patient Active Problem List    Diagnosis  Date Noted   .  Acute diastolic heart failure  58/08/9832   .  Cervical arthritis with myelopathy  07/18/2014   .  Constipation - functional    .  CAP (community acquired pneumonia)  07/10/2014   .  Sepsis  07/10/2014   .  HTN (hypertension)  07/10/2014   .  OSA on CPAP  07/10/2014   .  UTI (lower urinary tract infection)  07/10/2014   .  Edema leg  07/10/2014   .  Acute respiratory failure with hypoxia  07/10/2014   .  Lumbosacral spinal stenosis  03/13/2014   .  Other vitamin B12 deficiency anemia  07/13/2013   .  Other acquired deformity of ankle and foot(736.79)  02/17/2013   .  Polyneuropathy in other diseases classified elsewhere  02/17/2013   .  Other general symptoms(780.99)  02/17/2013   .  Abnormality of gait  02/17/2013   .  Cervical spondylosis with myelopathy  02/17/2013    Past Medical History:  Past Medical History   Diagnosis  Date   .  PONV (postoperative nausea and vomiting)    .  Hypertension    .  DJD (degenerative joint disease)      WHOLE SPINE   .  Hyperthyroidism      Had Iodine to resolve issue DX 10 YR AGO   .  Hypothyroidism      Low d/t treatment of hyperthyroidism   .  Sleep apnea      DX 2009/2010 [facility] done at [facility]   .  Type II diabetes mellitus      diet controlled   .  Heart murmur      Evaluated by cardiologist 4-5 years ago but does not currently see a cardiologist   .  Gait disorder    .  Cervical myelopathy      with myelomalacia at C5-6   .  RLS (restless legs syndrome)    .  Peripheral neuropathy    .  Lumbosacral spinal stenosis  03/13/2014   .  Constipation - functional      controlled with miralax   .  RBBB     Past Surgical History:  Past Surgical  History   Procedure  Laterality  Date   .  Cholecystectomy     .  Back surgery       6 surgeries   .  Lumbar laminectomy/decompression microdiscectomy   07/14/2012     Procedure: LUMBAR LAMINECTOMY/DECOMPRESSION MICRODISCECTOMY 2 LEVELS; Surgeon: Eustace Moore, MD; Location: [facility]; Service: Neurosurgery; Laterality: Left; Lumbar two three laminectomy, left thoracic four five transpedicular diskectomy    Assessment & Plan  Clinical Impression: Patient is a 68 y.o. 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 CXR with evidence of pneumonia and patient with progressive respiratory failure requiring intubation past admission. He was started on IV antibiotics for sepsis as well as IVF bolus for hypotension. Hospital course complicated by AKI due to ATN, agitation as well as intermittent fevers. Renal ultrasound normal without hydronephrosis. He completed antibiotics course was extubated on 07/19 without  difficulty.Patient transferred to CIR on 07/18/2014.    nervousness,, decreased cardiorespiratoy endurance and decreased oxygen support, decreased awareness, decreased safety awareness and decreased memory and decreased sitting balance, decreased standing balance, decreased postural control and difficulty maintaining precautions. Prior to hospitalization, patient could complete ADLs with modified independent .   Pt presents with decreased activity tolerance, decreased functional mobility, decreased balance, difficulty maintaining precautions, decreased safety, decreased memory, decreased problem solving  Limiting pt's independence with leisure/community pursuits.   Leisure History/Participation Premorbid leisure interest/current participation: [place] store;Community - Travel (Comment);Sports - Other (Comment);Sports - Exercise  (Comment) Other Leisure Interests: Television;Reading;Computer Leisure Participation Style: With Family/Friends Awareness of Community Resources: Fair-identify 2 post discharge leisure resources Psychosocial / Spiritual Social interaction - Mood/Behavior: Cooperative Academic librarian Appropriate for Education?: Yes Strengths/Weaknesses Patient Strengths/Abilities: Willingness to participate Patient weaknesses: Physical limitations;Minimal Premorbid Leisure Activity TR Patient demonstrates impairments in the following area(s): Endurance;Motor;Pain;Safety;Sensory;Skin Integrity  Plan Rec Therapy Plan Is patient appropriate for Therapeutic Recreation?: Yes Rehab Potential: Good Treatment times per week: Min 1 time per week >20 minutes Estimated Length of Stay: 3 weeks TR Treatment/Interventions: Adaptive equipment instruction;1:1 session;Balance/vestibular training;Functional mobility training;Community reintegration;Patient/family education;Therapeutic activities;Recreation/leisure participation;Therapeutic exercise;UE/LE Coordination activities;Wheelchair propulsion/positioning  Recommendations for other services: Neuropsych  Discharge Criteria: Patient will be discharged from TR if patient refuses treatment 3 consecutive times without medical reason.  If treatment goals not met, if there is a change in medical status, if patient makes no progress towards goals or if patient is discharged from hospital.  The above assessment, treatment plan, treatment alternatives and goals were discussed and mutually agreed upon: by patient  [place] 07/25/2014, 4:06 PM

## 2014-07-25 NOTE — Progress Notes (Signed)
Patient placed himself on home machine and home mask. Patient tolerating well but in need of chin strap.

## 2014-07-25 NOTE — Progress Notes (Signed)
Physical Therapy Session Note  Patient Details  Name: Travis [name] MRN: [id number] Date of Birth: May 12, 1946  Today's Date: 07/25/2014 Time: [id number] & [id number] Time Calculation (min): 45 min & 45 min  Short Term Goals: Week 1:  PT Short Term Goal 1 (Week 1): Pt will demonstrate log rolling in bed with rail req min A.  PT Short Term Goal 2 (Week 1): Pt will demonstrate side lie to sit transfer with rail req mod A.  PT Short Term Goal 3 (Week 1): Pt will demonstrate sit to stand with RW req max A.  PT Short Term Goal 4 (Week 1): Pt will demonstrate w/c propulsion x 100' req SBA.  PT Short Term Goal 5 (Week 1): Pt will demonstrate ambulation in // bars x 10' req mod A.   Skilled Therapeutic Interventions/Progress Updates:  AM Session (45 min): Pt. Seated in recliner with quick releasebelt donned for safety. Family friend present ([name]). Pt. Agreeable to physical therapy. Has no reports of pain.  1:1 Treatment focused on standing tolerance, therapeutic exercises, transfers.  Pt. Performed bilat LE exercises to tolerance with extended holds for increased muscle activation and strengthening against resistance seated in w/c for safety. Pt. Sit<>stand x6 with mod-max A and verbal cues for knee extension and glut activation. Pt. Assist level is mod A with sara steady from recliner with Bilat UE support. Static standing trials x6: :50 sec, 34 sec, 1:15 min, 2:32 min, 2:56 min, and 3:45 min intervals. Pt. Performance increased with each trial. Pt. Responded well to verbal cues and encouragement and able to activate muscle groups on cue; with fatigue causing diminished activity participation. Pain: no c/o pain.  Pt. Condition post therapy session: positioned seated in w/c with all needs within reach and quick release belt donned for pt. safety.   PM Session (45 min): Pt. Received seated in w/c having performed several laps around hospital floor; minimally mod-I for distances greater than  350 with bilat UE/LEs for propulsion. Pt. Agreeable to physical therapy. Family friend attending for session [name]) with patients permission.  2:1 Treatment focused on ambulation, transfers, and activity tolerance. Pt. Assist level is mod A with RW with therapist present. Pt. Demonstrated volitional control during standing trails with Filiberto Pinks (3 min) and progressed to [employer] for hallway ambulation of 45 feet with mod-max A for standing and min-A for ambulation progressing to mod A due to fatigue. Pt. Tolerated distance well and required minimal therapeutic rest/recovery break. Pt. Progressed to RW with +2A for safety and AD control/positioning; distance of 50 feet. Pt. Demonstrated frequent step-through gait pattern self initiated; fatigue required additional verbal cues for step height and maintaining a wider BOS. Pt. W/c<>bed transfers using squat-pivot with max A single therapist. Pt. Performance decreased for second transfer and required +2A for pelvic control and proper w/c seat/posterior alignment.  Pain: no c/o pain  Pt. Condition post therapy session: positioned seated in w/c with quick release belt donned. Pt. handed off to OT for next therapy session.  Therapy Documentation Precautions:  Precautions Precautions: Fall Precaution Comments: chronic back pain - performs bending with no B,L,T and log rolls Restrictions Weight Bearing Restrictions: No Vital Signs: SpO2: 95-98 % O2 Device: None (Room air) Locomotion : Ambulation Ambulation/Gait Assistance: 3: Mod assist Wheelchair Mobility Distance: 350   See FIM for current functional status  Therapy/Group: Individual Therapy  Juluis Mire 07/25/2014, 3:35 PM

## 2014-07-25 NOTE — Progress Notes (Signed)
Occupational Therapy Session Note  Patient Details  Name: Travis [name] MRN: [id number] Date of Birth: July 21, 1946  Today's Date: 07/25/2014 Time: 1102-1202 and 1445 - 1515 Time Calculation (min): 60 min and 30 min  Short Term Goals: Week 1:  OT Short Term Goal 1 (Week 1): Pt. will perform bathing , seated and standing, with Max A  in order to increase I in self care.  OT Short Term Goal 2 (Week 1): Pt. will engage in 10 minutes of functional activity without requiring rest break in order to increase endurance for occupational performance. OT Short Term Goal 3 (Week 1): Pt. will perform LB dressing with Max A in order to increase I with ADLs.  OT Short Term Goal 4 (Week 1): Pt. will perform supine to sit with Max A in order to  increase I  in bed mobility. OT Short Term Goal 5 (Week 1): Pt. will perform static sitting balance at EOB with feet supported and supervision for 2 or more minutes in order to increase balance for occupational performance.   Skilled Therapeutic Interventions/Progress Updates:    Session 1: Upon entering the room, pt seated in recliner chair with quick release belt on. Sit to stand from recliner chair into bariatric stedy with Max A with manual facilitation from OT. Once on stedy elevated surface , partial squat, sit to stand completed x 7 reps for ADL transfers and dressing with SBA to Min A secondary to fatigue with verbal cues for proper technique. Roll in shower chair utilized with stedy for shower transfer. Pt attempted to get out of shower chair when bathing and required verbal and tactile cues for safety. Pt utilized B UEs to pull LE up to knee for LB dressing. Pt required assist to keep leg in place and safety when lowering leg back to floor.   Session 2: Upon entering the room, pt finishing with physical therapy session. Pt reporting, "I am really tired. I might be out of steam." Squat pivot transfer to mat from wheelchair,  L side,  With Max A facilitation. Once  on mat, pt performed bed mobility of EOB scoots to the left and right with demonstration and verbal cues for technique. Squat pivot back to wheelchair with Mod A with two squat pivots required to reach destination. OT demonstrated squat pivot, forward leans, and counts to 3 to gain momentum for transfer. Pt propelled self to room from gym with B UEs for aerobic exercise for endurance training and B UE strengthening.  Pt making progress towards goals.   Therapy Documentation Precautions:  Precautions Precautions: Fall Precaution Comments: chronic back pain - performs bending with no B,L,T and log rolls Restrictions Weight Bearing Restrictions: No Vital Signs:   Pain: Pt with no c/o pain during session 1 or 2.   See FIM for current functional status  Therapy/Group: Individual Therapy  [name] 07/25/2014, 12:33 PM

## 2014-07-25 NOTE — Plan of Care (Signed)
Problem: Food- and Nutrition-Related Knowledge Deficit (NB-1.1) Goal: Nutrition education Formal process to instruct or train a patient/client in a skill or to impart knowledge to help patients/clients voluntarily manage or modify food choices and eating behavior to maintain or improve health. Outcome: Completed/Met Date Met:  07/25/14 Nutrition Education Note  RD consulted for nutrition education regarding Low Sodium Diet.  RD provided "Low Sodium Nutrition Therapy" handout from the Academy of [employer]. Reviewed patient's dietary recall. Provided examples on ways to decrease sodium intake in diet. Discouraged intake of processed foods and use of salt shaker. Encouraged fresh fruits and vegetables as well as whole grain sources of carbohydrates to maximize fiber intake.   RD discussed why it is important for patient to adhere to diet recommendations, and emphasized the role of fluids, foods to avoid, and importance of weighing self daily. Teach back method used.  Expect good compliance.  Body mass index is 34.91 kg/(m^2). Pt meets criteria for obesity based on current BMI.  Current diet order is hearty healthy, patient is consuming approximately 100% of meals at this time. Labs and medications reviewed. No further nutrition interventions warranted at this time. RD contact information provided. If additional nutrition issues arise, please re-consult RD.   Terrace Arabia RD, LDN

## 2014-07-25 NOTE — Progress Notes (Signed)
[place] PHYSICAL MEDICINE & REHABILITATION     PROGRESS NOTE    Subjective/Complaints: Had a much better night with new bed. Feels better this am.      Objective: Vital Signs: Blood pressure 115/71, pulse 97, temperature 98.5 F (36.9 C), temperature source Oral, resp. rate 17, height 6\' 1"  (1.854 m), weight 120 kg (264 lb 8.8 oz), SpO2 94.00%. No results found. No results found for this basename: WBC, HGB, HCT, PLT,  in the last 72 hours No results found for this basename: NA, K, CL, CO, GLUCOSE, BUN, CREATININE, CALCIUM,  in the last 72 hours CBG (last 3)   Recent Labs  07/24/14 1630 07/24/14 2053 07/25/14 0723  GLUCAP 155* 178* 163*    Wt Readings from Last 3 Encounters:  07/25/14 120 kg (264 lb 8.8 oz)  07/18/14 119.8 kg (264 lb 1.8 oz)  03/13/14 131.09 kg (289 lb)    Physical Exam:  Constitutional: He is oriented to person, place, and time. He appears well-developed and well-nourished. Side lying in bed  HENT: dentition fair. Oral mucosa pink/moist  Head: Normocephalic and atraumatic.  Eyes: Conjunctivae are normal. Pupils are equal, round, and reactive to light.  Neck: Normal range of motion. Neck supple.  Cardiovascular: Normal rate and regular rhythm. No murmurs  Respiratory: Effort normal and breath sounds normal. No respiratory distress. He has wheezes in upper airway.  GI: Soft. Bowel sounds are normal. He exhibits no distension. There is no tenderness.  Musculoskeletal: continued trace edema in both legs.. Has low back pain with minimal lower extremity movement and with bed mobility Neurological: He is alert and oriented to person, place, and time. Appears rested  Tremors BUE. Able to follow basic commands without difficulty. Dysesthesias bilateral feet CN exam normal.  extremity: Intrinsic atrophy bilateral hands.  Motor strength is 4 minus bilateral grip 5 bilateral biceps and deltoid, 4/5 left triceps 4/5 right tricep, wrist extension 4+  2 minus hip  flexion knee extension ankle dorsiflexion and plantarflexion  Sensation intact to light touch in bilateral upper and lower extremities except for decreased proprioception in bilateral feet.  Deep tendon reflexes 2+ bilateral knees trace bilateral biceps and brachioradialis and triceps, 1+ patella,achilles  Ankles without clonus  Skin: chronic changes on feet/ dry, scaly skin, small reddened area left buttock, small stage 2 right buttock. Back surgery site intact  Psych: pleasant, appropriate  Assessment/Plan: 1. Functional deficits secondary to deconditioning, hx of cervical myelopathy which require 3+ hours per day of interdisciplinary therapy in a comprehensive inpatient rehab setting. Physiatrist is providing close team supervision and 24 hour management of active medical problems listed below. Physiatrist and rehab team continue to assess barriers to discharge/monitor patient progress toward functional and medical goals.  FIM: FIM - Bathing Bathing Steps Patient Completed: Chest;Right upper leg;Right Arm;Left upper leg;Left Arm;Abdomen;Front perineal area;Buttocks (B feet) Bathing: 4: Min-Patient completes 8-9 58f 10 parts or 75+ percent (roll in shower chair)  FIM - Upper Body Dressing/Undressing Upper body dressing/undressing steps patient completed: Thread/unthread right sleeve of pullover shirt/dresss;Put head through opening of pull over shirt/dress;Thread/unthread left sleeve of pullover shirt/dress;Pull shirt over trunk Upper body dressing/undressing: 5: Set-up assist to: Obtain clothing/put away FIM - Lower Body Dressing/Undressing Lower body dressing/undressing steps patient completed: Pull underwear up/down;Pull pants up/down;Thread/unthread right underwear leg;Thread/unthread left underwear leg Lower body dressing/undressing: 2: Max-Patient completed 25-49% of tasks (with use of  bariatric steady)  FIM - Toileting Toileting steps completed by patient: Adjust clothing after  toileting Toileting: 0:  Activity did not occur  FIM - Radio producer Devices: Bedside commode;Sliding board Toilet Transfers: 0-Activity did not occur  FIM - Control and instrumentation engineer Devices: Sliding board Bed/Chair Transfer: 3: Supine > Sit: Mod A (lifting assist/Pt. 50-74%/lift 2 legs;3: Sit > Supine: Mod A (lifting assist/Pt. 50-74%/lift 2 legs);3: Chair or W/C > Bed: Mod A (lift or lower assist);3: Bed > Chair or W/C: Mod A (lift or lower assist)  FIM - Locomotion: Wheelchair Distance: 150 ft Locomotion: Wheelchair: 5: Travels 150 ft or more: maneuvers on rugs and over door sills with supervision, cueing or coaxing FIM - Locomotion: Ambulation Locomotion: Ambulation Assistive Devices: Sara Plus Ambulation/Gait Assistance: 1: +2 Total assist Locomotion: Ambulation: 0: Activity did not occur  Comprehension Comprehension Mode: Auditory Comprehension: 5-Understands complex 90% of the time/Cues < 10% of the time  Expression Expression Mode: Verbal Expression: 5-Expresses basic 90% of the time/requires cueing < 10% of the time.  Social Interaction Social Interaction: 6-Interacts appropriately with others with medication or extra time (anti-anxiety, antidepressant).  Problem Solving Problem Solving: 4-Solves basic 75 - 89% of the time/requires cueing 10 - 24% of the time  Memory Memory Mode: Not assessed Memory: 5-Recognizes or recalls 90% of the time/requires cueing < 10% of the time  Medical Problem List and Plan:  1. Functional deficits secondary to Cervical myelopathy with deconditioning after pneumonia requiring intubation  2. DVT Prophylaxis/Anticoagulation: Pharmaceutical: Lovenox  3. Chronic Pain Management: Off Volatren and NSAIDS.   -oxycontin  -added baclofen for cramping/spasms which seems to have helped (stopped skelaxin)  -increased gabapentin for neuropathic pain   -seems to be doing a bit better  4. Mood:  Monitor as mentation improves. Will have LCSW follow for evaluation and support.  5. Neuropsych: This patient is capable of making decisions on his own behalf.  6. 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 HTN: Continue metoprolol and lasix. Cards recommendations: if needed to add norvasc and resume ACE if renal status stable.   -added 5mg  norvasc with improvement 9. Acute on chronic diastolic CHF: Daily weights. Low salt diet. Patient non compliant at home--will have RD educate on appropriate diet.  10. Hypotension with AKI: BUN holding around 50 11. BPH: foley d/c'ed Off cardura and ditropan at this time. Monitor voiding.  12. DM type 2 diet controlled:  Hgb A1c-7.0. BS poorly controlled due to stress/infection requiring lantus. Will monitor BS with ac/hs checks and start patient on oral agent. Will try to wean lantus as we increase amaryl.  13. Insomnia with RLS: Continue Mirapex. Monitor sleep wake cycle.  14. Constipation:  bowel program.  LOS (Days) 7 A FACE TO FACE EVALUATION WAS PERFORMED  Travis [name] T 07/25/2014 8:02 AM

## 2014-07-26 ENCOUNTER — Inpatient Hospital Stay (HOSPITAL_COMMUNITY): Payer: Medicare Other | Admitting: Occupational Therapy

## 2014-07-26 ENCOUNTER — Inpatient Hospital Stay (HOSPITAL_COMMUNITY): Payer: Medicare Other

## 2014-07-26 ENCOUNTER — Inpatient Hospital Stay (HOSPITAL_COMMUNITY): Payer: Medicare Other | Admitting: *Deleted

## 2014-07-26 ENCOUNTER — Inpatient Hospital Stay (HOSPITAL_COMMUNITY): Payer: Medicare Other | Admitting: Rehabilitation

## 2014-07-26 LAB — GLUCOSE, CAPILLARY
GLUCOSE-CAPILLARY: 127 mg/dL — AB (ref 70–99)
GLUCOSE-CAPILLARY: 151 mg/dL — AB (ref 70–99)
GLUCOSE-CAPILLARY: 148 mg/dL — AB (ref 70–99)
Glucose-Capillary: 136 mg/dL — ABNORMAL HIGH (ref 70–99)

## 2014-07-26 NOTE — Progress Notes (Signed)
Recreational Therapy Assessment and Plan  Patient Details  Name: Travis [name] MRN: [id number] Date of Birth: 1946-06-12 Today's Date: 07/26/2014  Rehab Potential: Good   ELOS: 2 weeks  Assessment Clinical Impression: Problem List:  Patient Active Problem List    Diagnosis  Date Noted   .  Acute diastolic heart failure  09/98/3382   .  Cervical arthritis with myelopathy  07/18/2014   .  Constipation - functional    .  CAP (community acquired pneumonia)  07/10/2014   .  Sepsis  07/10/2014   .  HTN (hypertension)  07/10/2014   .  OSA on CPAP  07/10/2014   .  UTI (lower urinary tract infection)  07/10/2014   .  Edema leg  07/10/2014   .  Acute respiratory failure with hypoxia  07/10/2014   .  Lumbosacral spinal stenosis  03/13/2014   .  Other vitamin B12 deficiency anemia  07/13/2013   .  Other acquired deformity of ankle and foot(736.79)  02/17/2013   .  Polyneuropathy in other diseases classified elsewhere  02/17/2013   .  Other general symptoms(780.99)  02/17/2013   .  Abnormality of gait  02/17/2013   .  Cervical spondylosis with myelopathy  02/17/2013    Past Medical History:  Past Medical History   Diagnosis  Date   .  PONV (postoperative nausea and vomiting)    .  Hypertension    .  DJD (degenerative joint disease)      WHOLE SPINE   .  Hyperthyroidism      Had Iodine to resolve issue DX 10 YR AGO   .  Hypothyroidism      Low d/t treatment of hyperthyroidism   .  Sleep apnea      DX 2009/2010 study done at [facility]   .  Type II diabetes mellitus      diet controlled   .  Heart murmur      Evaluated by cardiologist 4-5 years ago but does not currently see a cardiologist   .  Gait disorder    .  Cervical myelopathy      with myelomalacia at C5-6   .  RLS (restless legs syndrome)    .  Peripheral neuropathy    .  Lumbosacral spinal stenosis  03/13/2014   .  Constipation - functional      controlled with miralax   .  RBBB     Past Surgical History:  Past  Surgical History   Procedure  Laterality  Date   .  Cholecystectomy     .  Back surgery       6 surgeries   .  Lumbar laminectomy/decompression microdiscectomy   07/14/2012     Procedure: LUMBAR LAMINECTOMY/DECOMPRESSION MICRODISCECTOMY 2 LEVELS; Surgeon: Eustace Moore, MD; Location: [facility]; Service: Neurosurgery; Laterality: Left; Lumbar two three laminectomy, left thoracic four five transpedicular diskectomy    Assessment & Plan  Clinical Impression: Travis [name] is a 68 y.o. 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 CXR with evidence of pneumonia and patient with progressive respiratory failure requiring intubation past admission. He was started on IV antibiotics for sepsis as well as IVF bolus for hypotension. Hospital course complicated by AKI due to ATN, agitation as well as intermittent fevers. Renal ultrasound normal without hydronephrosis. He completed antibiotics course was  extubated on 07/19 without difficulty. Renal status slowly improving with increase in UOP. Has had SOB that has improved with diuresis. 2D echo with normal LVF. Cardiology consulted for input on CHF and recommended continuing IV diuresis with question of acute on chronic CHF with 4 L output and was changed to oral lasix bid today. Cards recommends monitoring for signs of AKI and may be able to decrease lasix to daily at discharge. Therapy evaluations done yesterday and CIR recommended by rehab team. Agitation resolved therefore Risperdal d/c and chronic pain/anxiety medications resumed today. Patient transferred to CIR on 07/18/2014 .   Prior to hospitalization, patient was modified independent with mobility and lived with Spouse in a House home. Home access is 2Stairs to enter.   Pt presents with decreased activity tolerance, decreased functional mobility, decreased balance, decreased  coordination, decreased initiation, decreased problem solving and decreased memory and difficulty maintaining precautions Limiting pt's independence with leisure/community pursuits.  Plan Min 1 time per week >20 minutes  Recommendations for other services: None  Discharge Criteria: Patient will be discharged from TR if patient refuses treatment 3 consecutive times without medical reason.  If treatment goals not met, if there is a change in medical status, if patient makes no progress towards goals or if patient is discharged from hospital.  The above assessment, treatment plan, treatment alternatives and goals were discussed and mutually agreed upon: by patient  [employer] [place] 07/26/2014, 5:21 PM

## 2014-07-26 NOTE — Patient Care Conference (Signed)
Inpatient RehabilitationTeam Conference and Plan of Care Update Date: 07/25/2014   Time: 2:15 PM    Patient Name: Travis [name]      Medical Record Number: [id number]  Date of Birth: [birth year] Sex: Male         Room/Bed: [id number] Payor Info: Payor: [employer]ICARE / Plan: BLUE MEDICARE / Product Type: *No Product type* /    Admitting Diagnosis: Deconditionig  s p  PNA  Admit Date/Time:  07/18/2014  5:51 PM Admission Comments: No comment available   Primary Diagnosis:  Cervical arthritis with myelopathy Principal Problem: Cervical arthritis with myelopathy  Patient Active Problem List   Diagnosis Date Noted  . Acute diastolic heart failure [id number]  . Cervical arthritis with myelopathy 07/18/2014  . Constipation - functional   . CAP (community acquired pneumonia) 07/10/2014  . Sepsis 07/10/2014  . HTN (hypertension) 07/10/2014  . OSA on CPAP 07/10/2014  . UTI (lower urinary tract infection) 07/10/2014  . Edema leg 07/10/2014  . Acute respiratory failure with hypoxia 07/10/2014  . 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 Other vitamin B12 deficiency anemia 07/13/2013  . 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 Polyneuropathy in other diseases classified elsewhere 02/17/2013  . Other general symptoms(780.99) 02/17/2013  . Abnormality of gait 02/17/2013  . Cervical spondylosis with myelopathy 02/17/2013    Expected Discharge Date: Expected Discharge Date: 08/09/14  Team Members Present: Physician leading conference: Dr. Alger Simons Social Worker Present: Lennart Pall, LCSW Nurse Present: Elliot Cousin, RN PT Present: Raylene Everts, Lorriane Shire, PT OT Present: Salome Spotted, OT;Jennifer Tamala Julian, OT;Katie Abner Greenspan, OT;Patricia Lissa Hoard, OT Other (Discipline and Name): Danne Baxter, RN [facility]) PPS Coordinator present : Daiva Nakayama, RN, CRRN;Becky Alwyn Ren, PT     Current Status/Progress Goal Weekly Team Focus  Medical   deconditioning due to mulaioptle medical issues. myelopathy with chronic pain/weakness  improve functional mobility  sleep, pain control, manage volume   Bowel/Bladder   Continent of bowel and bladder; LBM 6/28  Continent of bowel and bladder  Continue to toilet patient PRN   Swallow/Nutrition/ Hydration             ADL's   set up A for grroming, min A for bathing on roll in shower chair,  Max A for LB, total A for shower transfer secondary to roll in shower chair  Min A ADLs, set up A for UB  energy conservation for fatigue, standing tolerance, sit to stands, toilet transfers   Mobility   Mod A for bed mobility, Ax2 persons for transfers, mod Ax1person for ambulation (+2 for w/c follow) up to 76' w/ RW, supervision for w/c propulsion x150'  Min A-Supervision  functional endurance, strengthening, NMR, balance, ambulation, w/c propulsion, transfers, pt education   Communication             Safety/Cognition/ Behavioral Observations  No safety issues noted  Continue to encourage patient to call for assist as needed  Continue practicing ICARE and hourly rounding; allow patient to demonstrate use of call bell   Pain   Denies  </=3  Continue to monitor pain qshift and PRN   Skin   Stage I to sacrum  No new skin breakdown  Continue to boost q1hr in wheelchair or q2hr in bed for pressure relief    Rehab Goals Patient on target to meet rehab goals: Yes *See Care Plan and progress notes for long and short-term goals.  Barriers to Discharge: baseline deficits,  cognitive deficits    Possible Resolutions to Barriers:  min assist goals, ?sliding board transfers    Discharge Planning/Teaching Needs:  home with wife as primary support and able to provide 24/7 supervision/ assist      Team Discussion:  Still with weakness but improved overall.  Slept better last night which seems to have helped.  Still with some "foggy-ness" with cognition and requires verbal cues for safety.  On track to meet  min assist goals with short distance ambulation.  Revisions to Treatment Plan:  None   Continued Need for Acute Rehabilitation Level of Care: The patient requires daily medical management by a physician with specialized training in physical medicine and rehabilitation for the following conditions: Daily direction of a multidisciplinary physical rehabilitation program to ensure safe treatment while eliciting the highest outcome that is of practical value to the patient.: Yes Daily medical management of patient stability for increased activity during participation in an intensive rehabilitation regime.: Yes Daily analysis of laboratory values and/or radiology reports with any subsequent need for medication adjustment of medical intervention for : Pulmonary problems;Cardiac problems  Latarsha Zani 07/26/2014, 12:53 PM

## 2014-07-26 NOTE — Progress Notes (Signed)
[place] PHYSICAL MEDICINE & REHABILITATION     PROGRESS NOTE    Subjective/Complaints: Had another good night. Denies pain. Had better energy yesterday as a whole.   Objective: Vital Signs: Blood pressure 132/63, pulse 59, temperature 98.2 F (36.8 C), temperature source Oral, resp. rate 18, height 6\' 1"  (1.854 m), weight 120 kg (264 lb 8.8 oz), SpO2 95.00%. No results found. No results found for this basename: WBC, HGB, HCT, PLT,  in the last 72 hours No results found for this basename: NA, K, CL, CO, GLUCOSE, BUN, CREATININE, CALCIUM,  in the last 72 hours CBG (last 3)   Recent Labs  07/25/14 1650 07/25/14 2100 07/26/14 0728  GLUCAP 77 190* 136*    Wt Readings from Last 3 Encounters:  07/25/14 120 kg (264 lb 8.8 oz)  07/18/14 119.8 kg (264 lb 1.8 oz)  03/13/14 131.09 kg (289 lb)    Physical Exam:  Constitutional: He is oriented to person, place, and time. He appears well-developed and well-nourished. Side lying in bed  HENT: dentition fair. Oral mucosa pink/moist  Head: Normocephalic and atraumatic.  Eyes: Conjunctivae are normal. Pupils are equal, round, and reactive to light.  Neck: Normal range of motion. Neck supple.  Cardiovascular: Normal rate and regular rhythm. No murmurs  Respiratory: Effort normal and breath sounds normal. No respiratory distress. He has wheezes in upper airway.  GI: Soft. Bowel sounds are normal. He exhibits no distension. There is no tenderness.  Musculoskeletal: continued trace edema in both legs.. Has low back pain with minimal lower extremity movement and with bed mobility Neurological: He is alert and oriented to person, place, and time. Appears rested    Able to follow basic commands without difficulty. Dysesthesias bilateral feet CN exam normal.  extremity: Intrinsic atrophy bilateral hands.  Motor strength is 4 minus bilateral grip 5 bilateral biceps and deltoid, 4/5 left triceps 4/5 right tricep, wrist extension 4+  2 minus hip  flexion knee extension ankle dorsiflexion and plantarflexion  Sensation intact to light touch in bilateral upper and lower extremities except for decreased proprioception in bilateral feet.  Deep tendon reflexes 2+ bilateral knees trace bilateral biceps and brachioradialis and triceps, 1+ patella,achilles  Ankles without clonus  Skin: chronic changes on feet/ dry, scaly skin, small reddened area left buttock,  stage 2 right buttock. Back surgery site intact  Psych: pleasant, appropriate. Fair insight  Assessment/Plan: 1. Functional deficits secondary to deconditioning, hx of cervical myelopathy which require 3+ hours per day of interdisciplinary therapy in a comprehensive inpatient rehab setting. Physiatrist is providing close team supervision and 24 hour management of active medical problems listed below. Physiatrist and rehab team continue to assess barriers to discharge/monitor patient progress toward functional and medical goals.  Reviewed safety with patient today who agreed to try use better judgement---not sure how able he is to follow through however.  FIM: FIM - Bathing Bathing Steps Patient Completed: Chest;Right upper leg;Right Arm;Left upper leg;Left Arm;Abdomen;Front perineal area;Buttocks Bathing:  (roll in shower chair)  FIM - Upper Body Dressing/Undressing Upper body dressing/undressing steps patient completed: Thread/unthread right sleeve of pullover shirt/dresss;Thread/unthread left sleeve of pullover shirt/dress;Put head through opening of pull over shirt/dress;Pull shirt over trunk Upper body dressing/undressing: 5: Set-up assist to: Obtain clothing/put away FIM - Lower Body Dressing/Undressing Lower body dressing/undressing steps patient completed: Pull underwear up/down;Thread/unthread left pants leg;Pull pants up/down;Don/Doff left shoe;Fasten/unfasten left shoe;Fasten/unfasten right shoe Lower body dressing/undressing: 2: Max-Patient completed 25-49% of tasks  FIM -  Toileting Toileting steps completed  by patient: Adjust clothing after toileting Toileting: 0: Activity did not occur  FIM - Radio producer Devices: Research officer, trade union Transfers: 0-Activity did not occur  FIM - Control and instrumentation engineer Devices: Arm rests;Bed rails Bed/Chair Transfer: 2: Chair or W/C > Bed: Max A (lift and lower assist);2: Bed > Chair or W/C: Max A (lift and lower assist)  FIM - Locomotion: Wheelchair Distance: 350 Locomotion: Wheelchair: 5: Travels 150 ft or more: maneuvers on rugs and over door sills with supervision, cueing or coaxing FIM - Locomotion: Ambulation Locomotion: Ambulation Assistive Devices: Administrator Ambulation/Gait Assistance: 3: Mod assist Locomotion: Ambulation: 2: Travels 50 - 149 ft with moderate assistance (Pt: 50 - 74%)  Comprehension Comprehension Mode: Auditory Comprehension: 5-Understands complex 90% of the time/Cues < 10% of the time  Expression Expression Mode: Verbal Expression: 5-Expresses complex 90% of the time/cues < 10% of the time  Social Interaction Social Interaction: 5-Interacts appropriately 90% of the time - Needs monitoring or encouragement for participation or interaction.  Problem Solving Problem Solving: 4-Solves basic 75 - 89% of the time/requires cueing 10 - 24% of the time  Memory Memory Mode: Not assessed Memory: 5-Recognizes or recalls 90% of the time/requires cueing < 10% of the time  Medical Problem List and Plan:  1. Functional deficits secondary to Cervical myelopathy with deconditioning after pneumonia requiring intubation  2. DVT Prophylaxis/Anticoagulation: Pharmaceutical: Lovenox  3. Chronic Pain Management: Off Volatren and NSAIDS.   -oxycontin  -added baclofen for cramping/spasms which seems to have helped (stopped skelaxin)  -increased gabapentin for neuropathic pain   -seems to be doing a bit better  4. Mood: Monitor as  mentation improves. Will have LCSW follow for evaluation and support.  5. Neuropsych: This patient is capable of making decisions on his own behalf.  6. 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 HTN: Continue metoprolol and lasix. Cards recommendations: if needed to add norvasc and resume ACE if renal status stable.   -added 5mg  norvasc with improvement 9. Acute on chronic diastolic CHF: Daily weights. Low salt diet. Patient non compliant at home--will have RD educate on appropriate diet.  10. Hypotension with AKI: BUN holding around 50 11. BPH: foley d/c'ed Off cardura and ditropan at this time. Monitor voiding.  12. DM type 2 diet controlled:  Hgb A1c-7.0. BS poorly controlled due to stress/infection requiring lantus. Will monitor BS with ac/hs checks and start patient on oral agent. Will try to wean lantus as we increase amaryl.  13. Insomnia with RLS: Continue Mirapex. Monitor sleep wake cycle.  14. Constipation:  bowel program.  LOS (Days) 8 A FACE TO FACE EVALUATION WAS PERFORMED  SWARTZ,ZACHARY T 07/26/2014 8:09 AM

## 2014-07-26 NOTE — Progress Notes (Signed)
Social Work Patient ID: Travis [name], male   DOB: 1946/01/30, 68 y.o.   MRN: [id number]  Have reviewed team conference with pt and wife (via phone) with both aware and agreeable to targeted d/c date of 8/12 with minimal assistance goals.  Wife reports she has not been at hospital very much the past few days because she has been working to place her mother in [place].  Explained to her that we will complete family ed closer to the d/c date.  Pt feels that he had a "really good day" yesterday but is sore today.  Will continue to follow for support and d/c planning.  Harlie Buening, LCSW

## 2014-07-26 NOTE — Progress Notes (Signed)
Recreational Therapy Session Note  Patient Details  Name: PASON BRADNEY MRN: [id number] Date of Birth: 10/15/1946 Today's Date: 07/26/2014  Pain: no c/o Skilled Therapeutic Interventions/Progress Updates: Session focused on psychosocial support as pt reports difficulty coping with hospitalization & loss of independence.  Addressed as appropriate.  Therapy/Group: Individual Therapy   Corrigan Kretschmer 07/26/2014, 5:25 PM

## 2014-07-26 NOTE — Progress Notes (Signed)
Occupational Therapy Session Note  Patient Details  Name: Travis [name] MRN: [id number] Date of Birth: 11/17/1946  Today's Date: 07/26/2014 Time: 1100-1205 Time Calculation (min): 65 min  Short Term Goals: Week 1:  OT Short Term Goal 1 (Week 1): Pt. will perform bathing , seated and standing, with Max A  in order to increase I in self care.  OT Short Term Goal 2 (Week 1): Pt. will engage in 10 minutes of functional activity without requiring rest break in order to increase endurance for occupational performance. OT Short Term Goal 3 (Week 1): Pt. will perform LB dressing with Max A in order to increase I with ADLs.  OT Short Term Goal 4 (Week 1): Pt. will perform supine to sit with Max A in order to  increase I  in bed mobility. OT Short Term Goal 5 (Week 1): Pt. will perform static sitting balance at EOB with feet supported and supervision for 2 or more minutes in order to increase balance for occupational performance.   Skilled Therapeutic Interventions/Progress Updates:    Pt making significant progress this session. Shower transfer of squat pivot from wheelchair to tub transfer bench with Mod A. Pt demonstration of lateral leans to L and R with verbal cues from therapist from facilitation. Verbal cues for technique to wash and dress LB ( B feet, threading). Verbal cues for pursed lip breathing during session. Pt independently verbalized energy conservation techniques as previously educated. Attempt to stand from wheelchair height and able to clear bottom with total A but pt unable to come to complete stand with total A. Bariatric stedy utilized with Mod A to stand and Min A for balance when pulling pants up B hips.   Therapy Documentation Precautions:  Precautions Precautions: Fall Precaution Comments: chronic back pain - performs bending with no B,L,T and log rolls Restrictions Weight Bearing Restrictions: No  Pain: Pt with 4/10 c/o pain towards the end of therapy session. Pt  describes pain as dull ache and reports he has not been taking pain medication regularly " to wean" himself off of it. OT discussed pain management strategies with pt which included taking medication prior to therapy sessions. Education to continue. Rest and RN notified.  See FIM for current functional status  Therapy/Group: Individual Therapy  [name] 07/26/2014, 3:53 PM

## 2014-07-26 NOTE — Progress Notes (Signed)
Physical Therapy Weekly Progress Note  Patient Details  Name: Travis [name] MRN: [id number] Date of Birth: 06/12/1946  Beginning of progress report period: July 19, 2014 End of progress report period: July 26, 2014  Today's Date: 07/26/2014 Time: 0800-0900 Total Time: 61mn  Pt continues to be very motivated to participate in therapy and has made excellent gains since admission. Pt has achieved 4/5 STGs, see details below. Pt demonstrates daily gains regarding functional endurance, balance, strength and motor control. Pt currently requires mod A for bed mobility, Ax2 persons for squat pivot transfers and t/f sit<>stand, min A for static standing balance, Ax2 persons for ambulation up to 50' w/ RW (+2 for w/c follow), supervision for w/c propulsion >150'.   Patient continues to demonstrate the following deficits: decreased functional endurance, decreased strength, decreased balance, decreased motor control, decreased sensation, decreased coordination, decreased overall mobility and therefore will continue to benefit from skilled PT intervention to enhance overall performance with activity tolerance, balance, postural control, ability to compensate for deficits, functional use of  right lower extremity and left lower extremity, awareness and coordination.  Patient progressing toward long term goals..  Continue plan of care.  PT Short Term Goals Week 1:  PT Short Term Goal 1 (Week 1): Pt will demonstrate log rolling in bed with rail req min A.  PT Short Term Goal 1 - Progress (Week 1): Met PT Short Term Goal 2 (Week 1): Pt will demonstrate side lie to sit transfer with rail req mod A.  PT Short Term Goal 2 - Progress (Week 1): Met PT Short Term Goal 3 (Week 1): Pt will demonstrate sit to stand with RW req max A.  PT Short Term Goal 3 - Progress (Week 1): Progressing toward goal PT Short Term Goal 4 (Week 1): Pt will demonstrate w/c propulsion x 100' req SBA.  PT Short Term Goal 4 - Progress  (Week 1): Met PT Short Term Goal 5 (Week 1): Pt will demonstrate ambulation in // bars x 10' req mod A.  PT Short Term Goal 5 - Progress (Week 1): Met Week 2:  PT Short Term Goal 1 (Week 2): Pt to perform bed mobility with min Ax1person PT Short Term Goal 2 (Week 2): Pt to perform bed<>w/c transfers with max Ax1person PT Short Term Goal 3 (Week 2): Pt will demonstrate w/c propulsion x150' at mod(I) level PT Short Term Goal 4 (Week 2): Pt will ambulate 560 w/ RW and mod Ax1person PT Short Term Goal 5 (Week 2): Pt will negotiation up/down 2 steps + single rail with mod A x1person  Skilled Therapeutic Interventions/Progress Updates:  1:1. Pt received semi-reclined in bed, ready for therapy. Focus this session on functional transfers and functional mobility.   Attempted squat pivot transfer w/ use of chair in front of pt for B UE support to encourage anterior weight shift, but unsuccessful. Pt req Ax2persons for safe completion of squat pivot t/f bed<>w/c w/ use of bed rail and arm rest. Pt able to demonstrate t/f sup<>sit w/ use of log roll technique, req min-mod A and B rolling w/ supervision- cues for placing B LE in hooklying for increased ease. Pt continues to req Ax2persons for all t/f sit<>stand from w/c height w/ RW.  Pt propelled w/c 175' w/ B UE, supervision and mod cueing for sequencing due to decreased strength in L UE compared to R UE. Pt cued to give extra push with L UE every other time to maintain straight path.   Pt  amb 40' and 30' w/ RW, Ax2 persons (+2 for w/c follow). Pt req multimodal cues for posture, foot clearance and increased BOS. Pt also req cues for seated rest as noted by decrease in quality of steps.  Pt left sitting in w/c all needs in reach, quick release belt in place.   Therapy Documentation Precautions:  Precautions Precautions: Fall Precaution Comments: chronic back pain - performs bending with no B,L,T and log rolls Restrictions Weight Bearing Restrictions:  No General:   Vital Signs:    See FIM for current functional status  Therapy/Group: Individual Therapy  Gilmore Laroche 07/26/2014, 7:41 PM

## 2014-07-26 NOTE — Progress Notes (Signed)
Physical Therapy Session Note  Patient Details  Name: Travis [name] MRN: [id number] Date of Birth: February 08, 1946  Today's Date: 07/26/2014 Time: 1100-1205 Time Calculation (min): 65 min  Short Term Goals: Week 1:  PT Short Term Goal 1 (Week 1): Pt will demonstrate log rolling in bed with rail req min A.  PT Short Term Goal 1 - Progress (Week 1): Met PT Short Term Goal 2 (Week 1): Pt will demonstrate side lie to sit transfer with rail req mod A.  PT Short Term Goal 2 - Progress (Week 1): Met PT Short Term Goal 3 (Week 1): Pt will demonstrate sit to stand with RW req max A.  PT Short Term Goal 3 - Progress (Week 1): Progressing toward goal PT Short Term Goal 4 (Week 1): Pt will demonstrate w/c propulsion x 100' req SBA.  PT Short Term Goal 4 - Progress (Week 1): Met PT Short Term Goal 5 (Week 1): Pt will demonstrate ambulation in // bars x 10' req mod A.  PT Short Term Goal 5 - Progress (Week 1): Met  Skilled Therapeutic Interventions/Progress Updates:   Pt received sitting in w/c in room, agreeable to therapy.  Requesting to use restroom prior to session, therefore pt able to use urinal independently.  Pt self propelled to therapy gym using BUEs at S level.  Min cues to avoid obstacles on the L.  Pt aware that L side weaker than R side and causes him to veer L.  Once in therapy gym, skilled session focused on sit<>stands from varying heights, stair negotiation, and short distance gait, followed by dynamic standing activity to increase balance and standing tolerance.  Performed approx 5 sit<>stands from varying heights, lowering as he improved.  Pt able to stand with min A to max A (from lower height and when fatigued) with cues and facilitation for increased forward weight shift and working on placing hand on mat and reaching back when sitting for increased safety.  Performed single 4" step with B handrails up forwards/down backwards with +2 assist for safety with max verbal cues for sequencing  and technique.  Ambulated x 79' with RW at mod A (+2 for chair follow) with continued cues for upright posture and increased step width with facilitation and tactile cues for increased glute/quad activation on stance leg.  Ended session with standing basketball shoot with single UE support on RW.  Performed x 2 mins with continuous cues for increased extension in LEs and trunk.  Pt assisted back to w/c and propelled back to room.  Left in w/c in room with all needs in reach.    Therapy Documentation Precautions:  Precautions Precautions: Fall Precaution Comments: chronic back pain - performs bending with no B,L,T and log rolls Restrictions Weight Bearing Restrictions: No   Pain: Pain Assessment Pain Assessment: No/denies pain   Locomotion : Ambulation Ambulation/Gait Assistance: 3: Mod assist   See FIM for current functional status  Therapy/Group: Individual Therapy  Denice Bors 07/26/2014, 1:25 PM

## 2014-07-27 ENCOUNTER — Encounter (HOSPITAL_COMMUNITY): Payer: Medicare Other | Admitting: Occupational Therapy

## 2014-07-27 ENCOUNTER — Inpatient Hospital Stay (HOSPITAL_COMMUNITY): Payer: Medicare Other | Admitting: Occupational Therapy

## 2014-07-27 ENCOUNTER — Inpatient Hospital Stay (HOSPITAL_COMMUNITY): Payer: Medicare Other

## 2014-07-27 LAB — GLUCOSE, CAPILLARY
GLUCOSE-CAPILLARY: 93 mg/dL (ref 70–99)
GLUCOSE-CAPILLARY: 160 mg/dL — AB (ref 70–99)
Glucose-Capillary: 137 mg/dL — ABNORMAL HIGH (ref 70–99)
Glucose-Capillary: 127 mg/dL — ABNORMAL HIGH (ref 70–99)

## 2014-07-27 MED ORDER — INSULIN GLARGINE 100 UNIT/ML ~~LOC~~ SOLN
5.0000 [IU] | Freq: Every day | SUBCUTANEOUS | Status: DC
  Administered 2014-07-27 – 2014-07-28 (×2): 5 [IU] via SUBCUTANEOUS
  Filled 2014-07-27 (×3): qty 0.05

## 2014-07-27 MED ORDER — FLUCONAZOLE 100 MG PO TABS
100.0000 mg | ORAL_TABLET | Freq: Every day | ORAL | Status: AC
  Administered 2014-07-27 – 2014-07-31 (×5): 100 mg via ORAL
  Filled 2014-07-27 (×5): qty 1

## 2014-07-27 MED ORDER — GERHARDT'S BUTT CREAM
TOPICAL_CREAM | Freq: Three times a day (TID) | CUTANEOUS | Status: DC
  Administered 2014-07-27 – 2014-08-09 (×30): via TOPICAL
  Filled 2014-07-27: qty 1

## 2014-07-27 NOTE — Progress Notes (Signed)
Recreational Therapy Session Note  Patient Details  Name: Travis [name] MRN: [id number] Date of Birth: Mar 25, 1946 Today's Date: 07/27/2014  Pain: no c/o Skilled Therapeutic Interventions/Progress Updates: Session focused on activity tolerance, w/c mobility, sit->stands, dynamic standing balance, knee control, positive reinforcement.  Pt propels w/c on unit with Mod I using BUE's.  Pt performed scoot pivot transfers with mod-max assist, mod instructional cues.  Pt stood to play horseshoes with 1 UE support with mod-max assist.  Therapy/Group: Co-Treatment Trevante Tennell 07/27/2014, 3:25 PM

## 2014-07-27 NOTE — Progress Notes (Signed)
[name]     PROGRESS NOTE    Subjective/Complaints: Slept better last night. Back is sore.   Objective: Vital Signs: Blood pressure 146/70, pulse 61, temperature 98 F (36.7 C), temperature source Oral, resp. rate 18, height 6\' 1"  (1.854 m), weight 120 kg (264 lb 8.8 oz), SpO2 92.00%. No results found. No results found for this basename: WBC, HGB, HCT, PLT,  in the last 72 hours No results found for this basename: NA, K, CL, CO, GLUCOSE, BUN, CREATININE, CALCIUM,  in the last 72 hours CBG (last 3)   Recent Labs  07/26/14 1623 07/26/14 2039 07/27/14 0719  GLUCAP 151* 148* 137*    Wt Readings from Last 3 Encounters:  07/25/14 120 kg (264 lb 8.8 oz)  07/18/14 119.8 kg (264 lb 1.8 oz)  03/13/14 131.09 kg (289 lb)    Physical Exam:  Constitutional: He is oriented to person, place, and time. He appears well-developed and well-nourished. Side lying in bed  HENT: dentition fair. Oral mucosa pink/moist  Head: Normocephalic and atraumatic.  Eyes: Conjunctivae are normal. Pupils are equal, round, and reactive to light.  Neck: Normal range of motion. Neck supple.  Cardiovascular: Normal rate and regular rhythm. No murmurs  Respiratory: Effort normal and breath sounds normal. No respiratory distress. He has wheezes in upper airway.  GI: Soft. Bowel sounds are normal. He exhibits no distension. There is no tenderness.  Musculoskeletal: continued trace edema in both legs.. Has low back pain with minimal lower extremity movement and with bed mobility Neurological: He is alert and oriented to person, place, and time. Appears rested    Able to follow basic commands without difficulty. Dysesthesias bilateral feet CN exam normal.  extremity: Intrinsic atrophy bilateral hands.  Motor strength is 4 minus bilateral grip 5 bilateral biceps and deltoid, 4/5 left triceps 4/5 right tricep, wrist extension 4+  2 minus hip flexion knee extension ankle  dorsiflexion and plantarflexion  Sensation intact to light touch in bilateral upper and lower extremities except for decreased proprioception in bilateral feet.  Deep tendon reflexes 2+ bilateral knees trace bilateral biceps and brachioradialis and triceps, 1+ patella,achilles  Ankles without clonus  Skin: chronic changes on feet/ dry, scaly skin, small reddened area left buttock,  stage 2 right buttock. Back surgery site intact  Psych: pleasant, appropriate. Fair insight  Assessment/Plan: 1. Functional deficits secondary to deconditioning, hx of cervical myelopathy which require 3+ hours per day of interdisciplinary therapy in a comprehensive inpatient rehab setting. Physiatrist is providing close team supervision and 24 hour management of active medical problems listed below. Physiatrist and rehab team continue to assess barriers to discharge/monitor patient progress toward functional and medical goals.    FIM: FIM - Bathing Bathing Steps Patient Completed: Chest;Front perineal area;Buttocks;Right Arm;Left Arm;Right upper leg;Abdomen;Left upper leg Bathing: 4: Min-Patient completes 8-9 47f 10 parts or 75+ percent  FIM - Upper Body Dressing/Undressing Upper body dressing/undressing steps patient completed: Pull shirt over trunk;Put head through opening of pull over shirt/dress;Thread/unthread left sleeve of pullover shirt/dress;Thread/unthread right sleeve of pullover shirt/dresss Upper body dressing/undressing: 5: Set-up assist to: Obtain clothing/put away FIM - Lower Body Dressing/Undressing Lower body dressing/undressing steps patient completed: Thread/unthread left underwear leg;Thread/unthread left pants leg;Pull underwear up/down;Pull pants up/down;Fasten/unfasten left shoe;Fasten/unfasten right shoe Lower body dressing/undressing: 3: Mod-Patient completed 50-74% of tasks  FIM - Toileting Toileting steps completed by patient: Adjust clothing after toileting Toileting: 0: Activity did  not occur  FIM - Radio producer  Devices: Bedside commode;Sliding board Toilet Transfers: 0-Activity did not occur  FIM - Control and instrumentation engineer Devices: Arm rests;Bed rails Bed/Chair Transfer: 3: Supine > Sit: Mod A (lifting assist/Pt. 50-74%/lift 2 legs;3: Sit > Supine: Mod A (lifting assist/Pt. 50-74%/lift 2 legs);1: Two helpers  FIM - Locomotion: Wheelchair Distance: 200 Locomotion: Wheelchair: 5: Travels 150 ft or more: maneuvers on rugs and over door sills with supervision, cueing or coaxing FIM - Locomotion: Ambulation Locomotion: Ambulation Assistive Devices: Administrator Ambulation/Gait Assistance: 3: Mod assist;1: +2 Total assist Locomotion: Ambulation: 1: Two helpers (+2 chair follow)  Comprehension Comprehension Mode: Auditory Comprehension: 5-Understands complex 90% of the time/Cues < 10% of the time  Expression Expression Mode: Verbal Expression: 5-Expresses complex 90% of the time/cues < 10% of the time  Social Interaction Social Interaction: 5-Interacts appropriately 90% of the time - Needs monitoring or encouragement for participation or interaction.  Problem Solving Problem Solving: 4-Solves basic 75 - 89% of the time/requires cueing 10 - 24% of the time  Memory Memory Mode: Not assessed Memory: 5-Recognizes or recalls 90% of the time/requires cueing < 10% of the time  Medical Problem List and Plan:  1. Functional deficits secondary to Cervical myelopathy with deconditioning after pneumonia requiring intubation  2. DVT Prophylaxis/Anticoagulation: Pharmaceutical: Lovenox  3. Chronic Pain Management: Off Volatren and NSAIDS.   -oxycontin  -baclofen for cramps/spasms  -increased gabapentin for neuropathic pain   -seems to be doing a bit better  4. Mood: Monitor as mentation improves. Will have LCSW follow for evaluation and support.  5. Neuropsych: This patient is capable of making decisions on his  own behalf.  6. 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 HTN: Continue metoprolol and lasix. Cards recommendations: if needed to add norvasc and resume ACE if renal status stable.   -added 5mg  norvasc with improvement 9. Acute on chronic diastolic CHF: Daily weights. Low salt diet. Patient non compliant at home--will have RD educate on appropriate diet.  10. Hypotension with AKI: BUN holding around 50 11. BPH: foley d/c'ed Off cardura and ditropan at this time. Monitor voiding.  12. DM type 2 diet controlled:  Hgb A1c-7.0. BS poorly controlled due to stress/infection requiring lantus. Will monitor BS with ac/hs checks and start patient on oral agent. amaryl now at 4mg . lantus decreased to 5u qhs 13. Insomnia with RLS: Continue Mirapex. Monitor sleep wake cycle.  14. Constipation:  bowel program.  LOS (Days) 9 A FACE TO FACE EVALUATION WAS PERFORMED  Travis [name] T 07/27/2014 8:22 AM

## 2014-07-27 NOTE — Progress Notes (Signed)
Went to place patient on CPAP and he was placed and charted by nurse.

## 2014-07-27 NOTE — Progress Notes (Signed)
RT visited patient at 2200 about CPAP. Patient was already on CPAP and resting comfortably.

## 2014-07-27 NOTE — Progress Notes (Signed)
Occupational Therapy Weekly Progress Note  Patient Details  Name: Travis [name] MRN: [id number] Date of Birth: 10/23/1946  Beginning of progress report period: July 18, 2014 End of progress report period: July 27, 2014  Today's Date: 07/27/2014 Time: 1100-1200 Time Calculation (min): 60 min  Patient has met 5 of 5 short term goals.   Patient continues to demonstrate the following deficits: decreased strength in B LEs, decreased endurance, decreased functional mobility, decreased  I in ADLs, decreased balance and therefore will continue to benefit from skilled OT intervention to enhance overall performance with BADL.  Patient progressing toward long term goals..  Continue plan of care. Pt be seen 5-7 days/wk for 60 - 90 minutes of skilled occupational therapy services to address remaining goals and functional deficits.   OT Short Term Goals Week 2:  OT Short Term Goal 1 (Week 2): Pt. will perform bathing , seated and standing, with Mod A  in order to increase I in self care.  OT Short Term Goal 2 (Week 2): Pt. will engage in 20 minutes of functional activity without requiring rest break in order to increase endurance for occupational performance. OT Short Term Goal 3 (Week 2): Pt. will perform LB dressing with Min A in order to increase I with ADLs.  OT Short Term Goal 4 (Week 2): Pt. will perform sit to stand from wheelchair height with Mod A in order to increase functional mobility.  OT Short Term Goal 5 (Week 2): Pt. will perform supine to sit with min A in order to  increase I  in bed mobility.  Skilled Therapeutic Interventions/Progress Updates:    Pt transferred onto tub transfer bench with manual facilitation by therapist and verbal cues for proper technique. Dynamic seated balance on tub bench is fair. OT recommends pt purchase LH sponge in order to increase safety and I to wash B feet. Grooming preformed at sinkside , 5 out of 5 tasks, with set up A. Pursed lip breathing  continued. Ventilation in shower area discussed secondary to energy conservation concerns. Pt continues to require rest breaks after ~ 15 minutes of activity and increasing in frequency towards end of session. Bariatric stedy utilized for sit to stand to pull up underwear and elastic waist pants. Pt unable to come into full standing from wheelchair height with manual facilitation and total. Pt continues to be motivated for therapy sessions and is making progress towards goals.   Therapy Documentation Precautions:  Precautions Precautions: Fall Precaution Comments: chronic back pain - performs bending with no B,L,T and log rolls Restrictions Weight Bearing Restrictions: No Pain: No c/o pain during ADL session.    See FIM for current functional status  Therapy/Group: Individual Therapy  [place],  L 07/27/2014, 12:30 PM   

## 2014-07-27 NOTE — Progress Notes (Signed)
Occupational Therapy Session Note  Patient Details  Name: Travis [name] MRN: [id number] Date of Birth: [birth year]  Today's Date: 07/27/2014 Time: [id number] Time Calculation (min): 60 min  Short Term Goals: Week 2:  OT Short Term Goal 1 (Week 2): Pt. will perform bathing , seated and standing, with Mod A  in order to increase I in self care.  OT Short Term Goal 2 (Week 2): Pt. will engage in 20 minutes of functional activity without requiring rest break in order to increase endurance for occupational performance. OT Short Term Goal 3 (Week 2): Pt. will perform LB dressing with Min A in order to increase I with ADLs.  OT Short Term Goal 4 (Week 2): Pt. will perform sit to stand from wheelchair height with Mod A in order to increase functional mobility.  OT Short Term Goal 5 (Week 2): Pt. will perform supine to sit with min A in order to  increase I  in bed mobility.  Skilled Therapeutic Interventions/Progress Updates:  Publishing copy;Functional mobility training;Psychosocial support;Therapeutic Activities;UE/LE Strength taining/ROM;Self Care/advanced ADL retraining;Patient/family education.  Patient attempted to use urinal while seated in w/c however, urgency and ability to efficiently use urinal resulted in need to change his soiled shorts.  Wife present and assisted PRN and providing more assistance than patient needed.  Patient required max assist to perform sit>stand from w/c then steady assist-mod assist for dynamic balance to perform pants down, up, wash front peri area, single leg stance to remove soiled shorts then again to lift one leg at a time for wife to thread pants (one leg at a time) while standing with assist.  Engaged in review of options for shower chair in walk in shower. Removed old tub and replaced with walk in shower enclosure with curtain.  Patient propelled w/c forward and backward with BLEs only and lifted  fee off floor for this OT to push w/c to gym.  Patient min assist for scoot/squat pivot w/c> hi-low mat and to prepare for improved independence with self care tasks while standing, patient was mod assist for sit>stand 2X as well as while in standing-performed squats/weight shifts and march in place then march in place and kick legs while seated.    Therapy Documentation Precautions:  Precautions Precautions: Fall Precaution Comments: chronic back pain - performs bending with no B,L,T and log rolls Restrictions Weight Bearing Restrictions: No Pain: Denies pain  Therapy/Group: Individual Therapy  Leiani Enright, [place] 07/27/2014, 5:31 PM

## 2014-07-27 NOTE — Progress Notes (Signed)
Physical Therapy Session Note  Patient Details  Name: Travis [name] MRN: [id number] Date of Birth: 08/16/1946  Today's Date: 07/27/2014 Time: 0900-1000 Time Calculation (min): 60 min  Short Term Goals: Week 1:  PT Short Term Goal 1 (Week 1): Pt will demonstrate log rolling in bed with rail req min A.  PT Short Term Goal 1 - Progress (Week 1): Met PT Short Term Goal 2 (Week 1): Pt will demonstrate side lie to sit transfer with rail req mod A.  PT Short Term Goal 2 - Progress (Week 1): Met PT Short Term Goal 3 (Week 1): Pt will demonstrate sit to stand with RW req max A.  PT Short Term Goal 3 - Progress (Week 1): Progressing toward goal PT Short Term Goal 4 (Week 1): Pt will demonstrate w/c propulsion x 100' req SBA.  PT Short Term Goal 4 - Progress (Week 1): Met PT Short Term Goal 5 (Week 1): Pt will demonstrate ambulation in // bars x 10' req mod A.  PT Short Term Goal 5 - Progress (Week 1): Met Week 2:  PT Short Term Goal 1 (Week 2): Pt to perform bed mobility with min Ax1person PT Short Term Goal 2 (Week 2): Pt to perform bed<>w/c transfers with max Ax1person PT Short Term Goal 3 (Week 2): Pt will demonstrate w/c propulsion x150' at mod(I) level PT Short Term Goal 4 (Week 2): Pt will ambulate 50' w/ RW and mod Ax1person PT Short Term Goal 5 (Week 2): Pt will negotiation up/down 2 steps + single rail with mod A x1person  Skilled Therapeutic Interventions/Progress Updates:  Pt. Received seated in w/c with quick release belt donned. Pt. States he dislikes having it on. Pt. Admits to trying to get up unassisted and indicates he would try again if belt were not on.  2:1 (Recreational Therapy participating) Treatment focused on bilateral LE muscle performance (activation, endurance, strengthening) and balance/coordination; transfer techniques for greater functional independence with decreasing assistance. Pt. Assist level is mod to max A with RW.Treatment consisted of static balance  trials, dynamic balance trials utilizing horse shoes and crossing mid-line, using truck rotation and flexion to challenge balance. Pt performed multiple transfer methods: lateral scooting at (S), squat-pivot transfers with arm rests at min A, stand-pivot transfers with RW at mod-max A, and overall bed mobility strategies on blue mat table at min guard assist. Pt. Required several therapeutic rest/recovery breaks throughout session with minimal intervals that incorporated trunk control and postural alignment activities.  Pain: no c/o of pain post session  Pt. Condition post therapy session: positioned in w/c with quick release belt donned. RT remained with patient in hallway as pt. Stated preference to remain out of room.  Therapy Documentation Precautions:  Precautions Precautions: Fall Precaution Comments: chronic back pain - performs bending with no B,L,T and log rolls Restrictions Weight Bearing Restrictions: No Pain: Pain Assessment Pain Assessment: 0-10 Pain Score: 4  Pain Type: Acute pain Pain Location: Scapula Pain Orientation: Right;Left;Mid Pain Descriptors / Indicators: Aching Pain Onset: Gradual Patients Stated Pain Goal: 0 Pain Intervention(s): Relaxation;Repositioned  See FIM for current functional status  Therapy/Group: Individual Therapy  ,  R 07/27/2014, 4:21 PM  

## 2014-07-27 NOTE — Progress Notes (Signed)
Skin assessed by Algis Liming, PA and Elliot Cousin, WTA. Care plan revised to fit patients need.

## 2014-07-28 ENCOUNTER — Inpatient Hospital Stay (HOSPITAL_COMMUNITY): Payer: Medicare Other | Admitting: Occupational Therapy

## 2014-07-28 ENCOUNTER — Inpatient Hospital Stay (HOSPITAL_COMMUNITY): Payer: Medicare Other | Admitting: Physical Therapy

## 2014-07-28 ENCOUNTER — Inpatient Hospital Stay (HOSPITAL_COMMUNITY): Payer: Medicare Other

## 2014-07-28 DIAGNOSIS — R5381 Other malaise: Secondary | ICD-10-CM

## 2014-07-28 DIAGNOSIS — M4712 Other spondylosis with myelopathy, cervical region: Secondary | ICD-10-CM

## 2014-07-28 DIAGNOSIS — Z5189 Encounter for other specified aftercare: Secondary | ICD-10-CM

## 2014-07-28 DIAGNOSIS — I5031 Acute diastolic (congestive) heart failure: Secondary | ICD-10-CM

## 2014-07-28 DIAGNOSIS — J189 Pneumonia, unspecified organism: Secondary | ICD-10-CM

## 2014-07-28 DIAGNOSIS — A419 Sepsis, unspecified organism: Secondary | ICD-10-CM

## 2014-07-28 LAB — GLUCOSE, CAPILLARY
Glucose-Capillary: 132 mg/dL — ABNORMAL HIGH (ref 70–99)
Glucose-Capillary: 131 mg/dL — ABNORMAL HIGH (ref 70–99)
Glucose-Capillary: 141 mg/dL — ABNORMAL HIGH (ref 70–99)
Glucose-Capillary: 110 mg/dL — ABNORMAL HIGH (ref 70–99)

## 2014-07-28 NOTE — Progress Notes (Signed)
Occupational Therapy Session Note  Patient Details  Name: Travis [name] MRN: [id number] Date of Birth: 03-25-1946  Today's Date: 07/28/2014 Time: 0757 - [id number] and 1125-1155 Time Calculation (min): 60 and 30 min  Short Term Goals: Week 2:  OT Short Term Goal 1 (Week 2): Pt. will perform bathing , seated and standing, with Mod A  in order to increase I in self care.  OT Short Term Goal 2 (Week 2): Pt. will engage in 20 minutes of functional activity without requiring rest break in order to increase endurance for occupational performance. OT Short Term Goal 3 (Week 2): Pt. will perform LB dressing with Min A in order to increase I with ADLs.  OT Short Term Goal 4 (Week 2): Pt. will perform sit to stand from wheelchair height with Mod A in order to increase functional mobility.  OT Short Term Goal 5 (Week 2): Pt. will perform supine to sit with min A in order to  increase I  in bed mobility.  Skilled Therapeutic Interventions/Progress Updates:   Session 1: Upon entering the room, pt seated in wheelchair. Wheelchair to tub transfer bench with squat pivot and mod A. Min A squat pivot from tub bench to wheelchair. Grooming performed set up A sink side. OT continues to encourage pursed lip breathing techniques. Sit to stand from wheelchair height with RW and Max A to pull elastic waist pants up. Crossover technique encouraged for LB dressing as well. Pt continues to be fatigued throughout session. Dressing activities require pt to take multiple rest breaks.   Session 2:  Upon entering the room, pt supine in bed. Bed mobility performed with verbal cues and assist for log roll technique and Mod A for R LE and trunk. Bed height set at approximately ~ 24 inches for sit to stand x 3 reps with use of RW and Max A. Verbal cues required as well as physical assist to come to complete stand. Pt taking 5 steps towards the right and then the left with Min A - Mod A for balance. Pt taking 5 steps forwards and then  backwards with RW and verbal cues for RW and foot advancement. Pt making progress towards goals.   Therapy Documentation Precautions:  Precautions Precautions: Fall Precaution Comments: chronic back pain - performs bending with no B,L,T and log rolls Restrictions Weight Bearing Restrictions: No Pain: Session 1: no c/o pain. Session 2 : 3/10 pain reported in lower back and described as dull ache. Rest.Repositioned.   See FIM for current functional status  Therapy/Group: Individual Therapy  [name] 07/28/2014, 12:08 PM

## 2014-07-28 NOTE — Progress Notes (Signed)
Physical Therapy Session Note  Patient Details  Name: Travis [name] MRN: [id number] Date of Birth: [birth year]  Today's Date: 07/28/2014 Time: 1443-1540 Time Calculation (min): 63 min  Short Term Goals: Week 1:  PT Short Term Goal 1 (Week 1): Pt will demonstrate log rolling in bed with rail req min A.  PT Short Term Goal 1 - Progress (Week 1): Met PT Short Term Goal 2 (Week 1): Pt will demonstrate side lie to sit transfer with rail req mod A.  PT Short Term Goal 2 - Progress (Week 1): Met PT Short Term Goal 3 (Week 1): Pt will demonstrate sit to stand with RW req max A.  PT Short Term Goal 3 - Progress (Week 1): Progressing toward goal PT Short Term Goal 4 (Week 1): Pt will demonstrate w/c propulsion x 100' req SBA.  PT Short Term Goal 4 - Progress (Week 1): Met PT Short Term Goal 5 (Week 1): Pt will demonstrate ambulation in // bars x 10' req mod A.  PT Short Term Goal 5 - Progress (Week 1): Met Week 2:  PT Short Term Goal 1 (Week 2): Pt to perform bed mobility with min Ax1person PT Short Term Goal 2 (Week 2): Pt to perform bed<>w/c transfers with max Ax1person PT Short Term Goal 3 (Week 2): Pt will demonstrate w/c propulsion x150' at mod(I) level PT Short Term Goal 4 (Week 2): Pt will ambulate 21' w/ RW and mod Ax1person PT Short Term Goal 5 (Week 2): Pt will negotiation up/down 2 steps + single rail with mod A x1person  Skilled Therapeutic Interventions/Progress Updates:  AM Session (63 min): Pt. Received seated in w/c with quick release belt donned. Pt. Agreeable to physical therapy session. Requests void seated with urinal.  2:1 Treatment focused on gait, transfers, balance control, and increased activity tolerance. Pt. Assist level is min A to Mod A for transfers and ambulation with RW. Stairs are +2A with two rails. Treatment consisted of ambulation 30 feet with RW with verbal cues for knee extension, weight shifting, and step height/width. Pt. Performed sit<>stand transfers  x15 from various seat/mat heights with verbal and tactile cues for proper sequencing, postural alignment, and limb positioning. Pt. Demonstrated increased w/c mgmt with propulsion >150 feet gym>room with verbal cues for increasing LUE use control direction; pt had one trial of w/c propulsion with BUE/BLEs with emphasis on quad activity/coordination for 40 feet at start of session. Pt. Tolerated increased activity with visible signs of fatigue at session's end. Pt. States he has more muscles that he thought due to increased soreness/"burn".  Pain: no c/o pain.  Pt. Condition post therapy session: positioned seated in w/c with quick release belt with all needs within reach. Family (daughter-in-law) present and attending to any additional needs.    PM Session (30 min): Pt. Received seated in w/c without quick release belt. Pt. Agreeable to therapy.  1:1 Treatment focused on car transfers, sit<>stand without RW. Pt. Assist level is mod A with therapist and car grab handles. Treatment w/c <> car x 2 with education, demonstration, and teach back; Pt. Required verbal and tactile cues to perform task with extra time to become prepared. Pt. States he is looking forward to working on the car transfer again. Simulated [employer] seat height per pt's indication.  Pain: no c/o pain.  Pt. Condition post therapy session: positioned seated in w/c with quick release belt donned for safety with all needs within reach.   Therapy Documentation Precautions:  Precautions Precautions:  Fall Precaution Comments: chronic back pain - performs bending with no B,L,T and log rolls Restrictions Weight Bearing Restrictions: No Vital Signs: SpO2 95-97 on room air  See FIM for current functional status  Therapy/Group: Individual Therapy  Juluis Mire 07/28/2014, 12:29 PM

## 2014-07-28 NOTE — Progress Notes (Signed)
[place] PHYSICAL MEDICINE & REHABILITATION     PROGRESS NOTE    Subjective/Complaints: Had another good night of sleep. Denies pain. No breathing issues..   Objective: Vital Signs: Blood pressure 143/59, pulse 56, temperature 98.2 F (36.8 C), temperature source Oral, resp. rate 18, height 6\' 1"  (1.854 m), weight 120 kg (264 lb 8.8 oz), SpO2 94.00%. No results found. No results found for this basename: WBC, HGB, HCT, PLT,  in the last 72 hours No results found for this basename: NA, K, CL, CO, GLUCOSE, BUN, CREATININE, CALCIUM,  in the last 72 hours CBG (last 3)   Recent Labs  07/27/14 1614 07/27/14 2130 07/28/14 0726  GLUCAP 93 160* 132*    Wt Readings from Last 3 Encounters:  07/25/14 120 kg (264 lb 8.8 oz)  07/18/14 119.8 kg (264 lb 1.8 oz)  03/13/14 131.09 kg (289 lb)    Physical Exam:  Constitutional: He is oriented to person, place, and time. He appears well-developed and well-nourished. Side lying in bed  HENT: dentition fair. Oral mucosa pink/moist  Head: Normocephalic and atraumatic.  Eyes: Conjunctivae are normal. Pupils are equal, round, and reactive to light.  Neck: Normal range of motion. Neck supple.  Cardiovascular: Normal rate and regular rhythm. No murmurs  Respiratory: Effort normal and breath sounds normal. No respiratory distress. He has wheezes in upper airway.  GI: Soft. Bowel sounds are normal. He exhibits no distension. There is no tenderness.  Musculoskeletal: continued trace edema in both legs.. Has low back pain with minimal lower extremity movement and with bed mobility Neurological: He is alert and oriented to person, place, and time. Appears rested    Able to follow basic commands without difficulty. Dysesthesias bilateral feet CN exam normal.  extremity: Intrinsic atrophy bilateral hands.  Motor strength is 4 minus bilateral grip 5 bilateral biceps and deltoid, 4/5 left triceps 4/5 right tricep, wrist extension 4+  2 minus hip flexion  knee extension ankle dorsiflexion and plantarflexion  Sensation intact to light touch in bilateral upper and lower extremities except for decreased proprioception in bilateral feet.  Deep tendon reflexes 2+ bilateral knees trace bilateral biceps and brachioradialis and triceps, 1+ patella,achilles  Ankles without clonus  Skin: chronic changes on feet/ dry, scaly skin, small reddened area left buttock,  stage 2 right buttock. Back surgery site intact  Psych: pleasant, appropriate. Fair insight  Assessment/Plan: 1. Functional deficits secondary to deconditioning, hx of cervical myelopathy which require 3+ hours per day of interdisciplinary therapy in a comprehensive inpatient rehab setting. Physiatrist is providing close team supervision and 24 hour management of active medical problems listed below. Physiatrist and rehab team continue to assess barriers to discharge/monitor patient progress toward functional and medical goals.    FIM: FIM - Bathing Bathing Steps Patient Completed: Chest;Right Arm;Left Arm;Abdomen;Front perineal area;Buttocks;Right upper leg;Left upper leg Bathing: 4: Min-Patient completes 8-9 32f 10 parts or 75+ percent  FIM - Upper Body Dressing/Undressing Upper body dressing/undressing steps patient completed: Thread/unthread right sleeve of pullover shirt/dresss;Thread/unthread left sleeve of pullover shirt/dress;Put head through opening of pull over shirt/dress;Pull shirt over trunk Upper body dressing/undressing: 5: Set-up assist to: Obtain clothing/put away FIM - Lower Body Dressing/Undressing Lower body dressing/undressing steps patient completed: Thread/unthread left underwear leg;Pull underwear up/down;Thread/unthread left pants leg;Pull pants up/down;Fasten/unfasten pants;Don/Doff left shoe Lower body dressing/undressing: 3: Mod-Patient completed 50-74% of tasks  FIM - Toileting Toileting steps completed by patient: Adjust clothing prior to toileting;Performs  perineal hygiene;Adjust clothing after toileting Toileting Assistive Devices:  (walker)  Toileting: 4: Steadying assist  FIM - Radio producer Devices: Research officer, trade union Transfers: 0-Activity did not occur  FIM - Control and instrumentation engineer Devices: Arm rests;Bed rails Bed/Chair Transfer: 0: Activity did not occur  FIM - Locomotion: Wheelchair Distance: 250 Locomotion: Wheelchair: 5: Travels 150 ft or more: maneuvers on rugs and over door sills with supervision, cueing or coaxing FIM - Locomotion: Ambulation Locomotion: Ambulation Assistive Devices: Administrator Ambulation/Gait Assistance: 3: Mod assist;1: +2 Total assist Locomotion: Ambulation: 0: Activity did not occur  Comprehension Comprehension Mode: Auditory Comprehension: 5-Understands complex 90% of the time/Cues < 10% of the time  Expression Expression Mode: Verbal Expression: 5-Expresses complex 90% of the time/cues < 10% of the time  Social Interaction Social Interaction: 5-Interacts appropriately 90% of the time - Needs monitoring or encouragement for participation or interaction.  Problem Solving Problem Solving: 4-Solves basic 75 - 89% of the time/requires cueing 10 - 24% of the time  Memory Memory Mode: Not assessed Memory: 5-Recognizes or recalls 90% of the time/requires cueing < 10% of the time  Medical Problem List and Plan:  1. Functional deficits secondary to Cervical myelopathy with deconditioning after pneumonia requiring intubation  2. DVT Prophylaxis/Anticoagulation: Pharmaceutical: Lovenox  3. Chronic Pain Management: Off Volatren and NSAIDS.   -oxycontin  -baclofen for cramps/spasms  -increased gabapentin for neuropathic pain   -seems to be doing a bit better  4. Mood: Monitor as mentation improves. Will have LCSW follow for evaluation and support.  5. Neuropsych: This patient is capable of making decisions on his own behalf.   6. 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 HTN: Continue metoprolol and lasix. Cards recommendations: if needed to add norvasc and resume ACE if renal status stable.   -added 5mg  norvasc with improvement 9. Acute on chronic diastolic CHF: Daily weights. Low salt diet. Patient non compliant at home--will have RD educate on appropriate diet.  10. Hypotension with AKI: BUN holding around 50 11. BPH: foley d/c'ed Off cardura and ditropan at this time. Monitor voiding.  12. DM type 2 diet controlled:  Hgb A1c-7.0. BS poorly controlled due to stress/infection requiring lantus. Will monitor BS with ac/hs checks and start patient on oral agent. amaryl now at 4mg . lantus decreased to 5u qhs-wean to off this weekend 13. Insomnia with RLS: Continue Mirapex. Monitor sleep wake cycle.  14. Constipation:  bowel program.  LOS (Days) 10 A FACE TO FACE EVALUATION WAS PERFORMED  SWARTZ,ZACHARY T 07/28/2014 8:11 AM

## 2014-07-29 ENCOUNTER — Inpatient Hospital Stay (HOSPITAL_COMMUNITY): Payer: Medicare Other

## 2014-07-29 LAB — GLUCOSE, CAPILLARY
GLUCOSE-CAPILLARY: 164 mg/dL — AB (ref 70–99)
Glucose-Capillary: 142 mg/dL — ABNORMAL HIGH (ref 70–99)
Glucose-Capillary: 117 mg/dL — ABNORMAL HIGH (ref 70–99)

## 2014-07-29 NOTE — Progress Notes (Signed)
[place] PHYSICAL MEDICINE & REHABILITATION     PROGRESS NOTE    Subjective/Complaints: Sleeping well now. Up already shaving. Denies an pain,sob, cough, n/v   Objective: Vital Signs: Blood pressure 146/72, pulse 60, temperature 97.6 F (36.4 C), temperature source Oral, resp. rate 18, height 6\' 1"  (1.854 m), weight 119.75 kg (264 lb), SpO2 94.00%. No results found. No results found for this basename: WBC, HGB, HCT, PLT,  in the last 72 hours No results found for this basename: NA, K, CL, CO, GLUCOSE, BUN, CREATININE, CALCIUM,  in the last 72 hours CBG (last 3)   Recent Labs  07/28/14 1636 07/28/14 2105 07/29/14 0746  GLUCAP 141* 110* 142*    Wt Readings from Last 3 Encounters:  07/29/14 119.75 kg (264 lb)  07/18/14 119.8 kg (264 lb 1.8 oz)  03/13/14 131.09 kg (289 lb)    Physical Exam:  Constitutional: He is oriented to person, place, and time. He appears well-developed and well-nourished. Side lying in bed  HENT: dentition fair. Oral mucosa pink/moist  Head: Normocephalic and atraumatic.  Eyes: Conjunctivae are normal. Pupils are equal, round, and reactive to light.  Neck: Normal range of motion. Neck supple.  Cardiovascular: Normal rate and regular rhythm. No murmurs  Respiratory: Effort normal and breath sounds normal. No respiratory distress. He has wheezes in upper airway.  GI: Soft. Bowel sounds are normal. He exhibits no distension. There is no tenderness.  Musculoskeletal: continued trace edema in both legs.. Has low back pain with minimal lower extremity movement and with bed mobility Neurological: He is alert and oriented to person, place, and time. Appears rested    Able to follow basic commands without difficulty. Dysesthesias bilateral feet CN exam normal.  extremity: Intrinsic atrophy bilateral hands.  Motor strength is 4 minus bilateral grip 5 bilateral biceps and deltoid, 4/5 left triceps 4/5 right tricep, wrist extension 4+  2 minus hip flexion knee  extension ankle dorsiflexion and plantarflexion  Sensation intact to light touch in bilateral upper and lower extremities except for decreased proprioception in bilateral feet.  Deep tendon reflexes 2+ bilateral knees trace bilateral biceps and brachioradialis and triceps, 1+ patella,achilles  Ankles without clonus  Skin: chronic changes on feet/ dry, scaly skin, small reddened area left buttock,  stage 2 right buttock. Back surgery site intact  Psych: pleasant, appropriate. Fair insight  Assessment/Plan: 1. Functional deficits secondary to deconditioning, hx of cervical myelopathy which require 3+ hours per day of interdisciplinary therapy in a comprehensive inpatient rehab setting. Physiatrist is providing close team supervision and 24 hour management of active medical problems listed below. Physiatrist and rehab team continue to assess barriers to discharge/monitor patient progress toward functional and medical goals.    FIM: FIM - Bathing Bathing Steps Patient Completed: Chest;Right Arm;Left Arm;Abdomen;Front perineal area;Buttocks;Right upper leg;Left upper leg Bathing: 4: Min-Patient completes 8-9 46f 10 parts or 75+ percent  FIM - Upper Body Dressing/Undressing Upper body dressing/undressing steps patient completed: Thread/unthread right sleeve of pullover shirt/dresss;Thread/unthread left sleeve of pullover shirt/dress;Put head through opening of pull over shirt/dress;Pull shirt over trunk Upper body dressing/undressing: 5: Set-up assist to: Obtain clothing/put away FIM - Lower Body Dressing/Undressing Lower body dressing/undressing steps patient completed: Thread/unthread left underwear leg;Pull underwear up/down;Thread/unthread left pants leg;Pull pants up/down;Fasten/unfasten pants;Fasten/unfasten right shoe;Fasten/unfasten left shoe Lower body dressing/undressing: 3: Mod-Patient completed 50-74% of tasks  FIM - Toileting Toileting steps completed by patient: Adjust clothing prior  to toileting;Performs perineal hygiene;Adjust clothing after toileting Toileting Assistive Devices:  (walker) Toileting: 4:  Steadying assist  FIM - Radio producer Devices: Research officer, trade union Transfers: 0-Activity did not occur  FIM - Control and instrumentation engineer Devices: Copy: 4: Bed > Chair or W/C: Min A (steadying Pt. > 75%)  FIM - Locomotion: Wheelchair Distance: 350 Locomotion: Wheelchair: 5: Travels 150 ft or more: maneuvers on rugs and over door sills with supervision, cueing or coaxing FIM - Locomotion: Ambulation Locomotion: Ambulation Assistive Devices: Administrator Ambulation/Gait Assistance: 3: Mod assist;4: Min assist Locomotion: Ambulation: 1: Travels less than 50 ft with minimal assistance (Pt.>75%)  Comprehension Comprehension Mode: Auditory Comprehension: 5-Understands complex 90% of the time/Cues < 10% of the time  Expression Expression Mode: Verbal Expression: 6-Expresses complex ideas: With extra time/assistive device  Social Interaction Social Interaction: 6-Interacts appropriately with others with medication or extra time (anti-anxiety, antidepressant).  Problem Solving Problem Solving: 4-Solves basic 75 - 89% of the time/requires cueing 10 - 24% of the time  Memory Memory Mode: Not assessed Memory: 5-Recognizes or recalls 90% of the time/requires cueing < 10% of the time  Medical Problem List and Plan:  1. Functional deficits secondary to Cervical myelopathy with deconditioning after pneumonia requiring intubation  2. DVT Prophylaxis/Anticoagulation: Pharmaceutical: Lovenox  3. Chronic Pain Management: Off Volatren and NSAIDS.   -oxycontin  -baclofen for cramps/spasms  -increased gabapentin for neuropathic pain   -improved overall  4. Mood: Monitor as mentation improves. Will have LCSW follow for evaluation and support.  5. Neuropsych: This patient is capable  of making decisions on his own behalf.  6. 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 HTN: Continue metoprolol and lasix. Cards recommendations: if needed to add norvasc and resume ACE if renal status stable.   -added 5mg  norvasc with improvement 9. Acute on chronic diastolic CHF: Daily weights. Low salt diet. Patient non compliant at home--will have RD educate on appropriate diet.  10. Hypotension with AKI: BUN holding around 50 11. BPH: foley d/c'ed Off cardura and ditropan at this time. Monitor voiding.  12. DM type 2 diet controlled:  Hgb A1c-7.0. BS poorly controlled due to stress/infection requiring lantus. Will monitor BS with ac/hs checks and start patient on oral agent. amaryl now at 4mg .   -dc lantus 13. Insomnia with RLS: Continue Mirapex. Sleep overall improved with better bed 14. Constipation:  bowel program.  LOS (Days) 11 A FACE TO FACE EVALUATION WAS PERFORMED  SWARTZ,ZACHARY T 07/29/2014 8:47 AM

## 2014-07-29 NOTE — Progress Notes (Signed)
Physical Therapy Session Note  Patient Details  Name: Travis [name] MRN: [id number] Date of Birth: 1946-10-12  Today's Date: 07/29/2014 Time: 6734-1937 Time Calculation (min): 45 min  Short Term Goals: Week 1:  PT Short Term Goal 1 (Week 1): Pt will demonstrate log rolling in bed with rail req min A.  PT Short Term Goal 1 - Progress (Week 1): Met PT Short Term Goal 2 (Week 1): Pt will demonstrate side lie to sit transfer with rail req mod A.  PT Short Term Goal 2 - Progress (Week 1): Met PT Short Term Goal 3 (Week 1): Pt will demonstrate sit to stand with RW req max A.  PT Short Term Goal 3 - Progress (Week 1): Progressing toward goal PT Short Term Goal 4 (Week 1): Pt will demonstrate w/c propulsion x 100' req SBA.  PT Short Term Goal 4 - Progress (Week 1): Met PT Short Term Goal 5 (Week 1): Pt will demonstrate ambulation in // bars x 10' req mod A.  PT Short Term Goal 5 - Progress (Week 1): Met Week 2:  PT Short Term Goal 1 (Week 2): Pt to perform bed mobility with min Ax1person PT Short Term Goal 2 (Week 2): Pt to perform bed<>w/c transfers with max Ax1person PT Short Term Goal 3 (Week 2): Pt will demonstrate w/c propulsion x150' at mod(I) level PT Short Term Goal 4 (Week 2): Pt will ambulate 61' w/ RW and mod Ax1person PT Short Term Goal 5 (Week 2): Pt will negotiation up/down 2 steps + single rail with mod A x1person  Skilled Therapeutic Interventions/Progress Updates:  Pt. Received seated in W/C and agreeable to therapy. Donned patients short and t-shirt prior to session.  2:1 Room>gym Treatment focused on w/c mobility, gait endurance, transfers (sit<>stand) . Pt. Assist level is min A to mod A with RW. Pt. Ambulated 30 feet and 20 feet with RW min-mod A with w/c follow. Sit<>stand x 10 with variable height mat table +2 A for tactile cues with min A assist to stand. Pt. Requires verbal cues to remain in static standing position with proper postural alignment. Pt. Enthusiastic  with progress and provides extra effort to point of fatigue; be aware of visual cues for therapeutic rest/recovery breaks. Pt. Propelled w/c with BUE/BLEs with cues for feet positioning >200 feet  Pt. Condition post therapy session: positioned seated in w/c with quick release belt donned for safety with all needs within reach.   Therapy Documentation Precautions:  Precautions Precautions: Fall Precaution Comments: chronic back pain - performs bending with no B,L,T and log rolls Restrictions Weight Bearing Restrictions: No Pain: Pain Assessment Pain Assessment: No/denies pain Locomotion : Ambulation Ambulation/Gait Assistance: 4: Min assist   See FIM for current functional status  Therapy/Group: Individual Therapy  Juluis Mire 07/29/2014, 12:59 PM

## 2014-07-30 ENCOUNTER — Inpatient Hospital Stay (HOSPITAL_COMMUNITY): Payer: Medicare Other

## 2014-07-30 LAB — GLUCOSE, CAPILLARY
GLUCOSE-CAPILLARY: 156 mg/dL — AB (ref 70–99)
GLUCOSE-CAPILLARY: 118 mg/dL — AB (ref 70–99)
GLUCOSE-CAPILLARY: 180 mg/dL — AB (ref 70–99)
Glucose-Capillary: 157 mg/dL — ABNORMAL HIGH (ref 70–99)
Glucose-Capillary: 131 mg/dL — ABNORMAL HIGH (ref 70–99)

## 2014-07-30 NOTE — Progress Notes (Signed)
[facility] PHYSICAL MEDICINE & REHABILITATION     PROGRESS NOTE    Subjective/Complaints: Had a good night again. Feels rested. No complaints. Feels that his feet and legs are getting stronger  Objective: Vital Signs: Blood pressure 149/73, pulse 66, temperature 97.6 F (36.4 C), temperature source Oral, resp. rate 16, height 6\' 1"  (1.854 m), weight 119.296 kg (263 lb), SpO2 95.00%. No results found. No results found for this basename: WBC, HGB, HCT, PLT,  in the last 72 hours No results found for this basename: NA, K, CL, CO, GLUCOSE, BUN, CREATININE, CALCIUM,  in the last 72 hours CBG (last 3)   Recent Labs  07/29/14 1643 07/29/14 2041 07/30/14 0733  GLUCAP 164* 156* 157*    Wt Readings from Last 3 Encounters:  07/30/14 119.296 kg (263 lb)  07/18/14 119.8 kg (264 lb 1.8 oz)  03/13/14 131.09 kg (289 lb)    Physical Exam:  Constitutional: He is oriented to person, place, and time. He appears well-developed and well-nourished. Side lying in bed  HENT: dentition fair. Oral mucosa pink/moist  Head: Normocephalic and atraumatic.  Eyes: Conjunctivae are normal. Pupils are equal, round, and reactive to light.  Neck: Normal range of motion. Neck supple.  Cardiovascular: Normal rate and regular rhythm. No murmurs  Respiratory: Effort normal and breath sounds normal. No respiratory distress. He has wheezes in upper airway.  GI: Soft. Bowel sounds are normal. He exhibits no distension. There is no tenderness.  Musculoskeletal: continued trace edema in both legs.. Has low back pain with minimal lower extremity movement and with bed mobility Neurological: He is alert and oriented to person, place, and time. Appears rested    Able to follow basic commands without difficulty. Dysesthesias bilateral feet CN exam normal.  extremity: Intrinsic atrophy bilateral hands.  Motor strength is 4 minus bilateral grip 5 bilateral biceps and deltoid, 4/5 left triceps 4/5 right tricep, wrist  extension 4+  2 minus hip flexion knee extension ankle dorsiflexion and plantarflexion  Sensation intact to light touch in bilateral upper and lower extremities except for decreased proprioception in bilateral feet.  Deep tendon reflexes 2+ bilateral knees trace bilateral biceps and brachioradialis and triceps, 1+ patella,achilles  Ankles without clonus  Skin: chronic changes on feet/ dry, scaly skin, small reddened area left buttock,  stage 2 right buttock. Back surgery site intact  Psych: pleasant, appropriate. Fair insight  Assessment/Plan: 1. Functional deficits secondary to deconditioning, hx of cervical myelopathy which require 3+ hours per day of interdisciplinary therapy in a comprehensive inpatient rehab setting. Physiatrist is providing close team supervision and 24 hour management of active medical problems listed below. Physiatrist and rehab team continue to assess barriers to discharge/monitor patient progress toward functional and medical goals.    FIM: FIM - Bathing Bathing Steps Patient Completed: Chest;Right Arm;Left Arm;Abdomen;Front perineal area;Buttocks;Right upper leg;Left upper leg Bathing: 4: Min-Patient completes 8-9 3f 10 parts or 75+ percent  FIM - Upper Body Dressing/Undressing Upper body dressing/undressing steps patient completed: Thread/unthread right sleeve of pullover shirt/dresss;Thread/unthread left sleeve of pullover shirt/dress;Put head through opening of pull over shirt/dress;Pull shirt over trunk Upper body dressing/undressing: 5: Set-up assist to: Obtain clothing/put away FIM - Lower Body Dressing/Undressing Lower body dressing/undressing steps patient completed: Thread/unthread left underwear leg;Pull underwear up/down;Thread/unthread left pants leg;Pull pants up/down;Fasten/unfasten pants;Fasten/unfasten right shoe;Fasten/unfasten left shoe Lower body dressing/undressing: 3: Mod-Patient completed 50-74% of tasks  FIM - Toileting Toileting steps  completed by patient: Adjust clothing prior to toileting;Performs perineal hygiene;Adjust clothing after toileting Toileting  Assistive Devices:  (walker) Toileting: 4: Steadying assist  FIM - Radio producer Devices: Research officer, trade union Transfers: 0-Activity did not occur  FIM - Control and instrumentation engineer Devices: Copy: 4: Bed > Chair or W/C: Min A (steadying Pt. > 75%)  FIM - Locomotion: Wheelchair Distance: 350 Locomotion: Wheelchair: 0: Activity did not occur FIM - Locomotion: Ambulation Locomotion: Ambulation Assistive Devices: Administrator Ambulation/Gait Assistance: 4: Min assist Locomotion: Ambulation: 1: Travels less than 50 ft with minimal assistance (Pt.>75%)  Comprehension Comprehension Mode: Auditory Comprehension: 5-Understands complex 90% of the time/Cues < 10% of the time  Expression Expression Mode: Verbal Expression: 6-Expresses complex ideas: With extra time/assistive device  Social Interaction Social Interaction: 6-Interacts appropriately with others with medication or extra time (anti-anxiety, antidepressant).  Problem Solving Problem Solving: 4-Solves basic 75 - 89% of the time/requires cueing 10 - 24% of the time  Memory Memory Mode: Not assessed Memory: 5-Recognizes or recalls 90% of the time/requires cueing < 10% of the time  Medical Problem List and Plan:  1. Functional deficits secondary to Cervical myelopathy with deconditioning after pneumonia requiring intubation  2. DVT Prophylaxis/Anticoagulation: Pharmaceutical: Lovenox  3. Chronic Pain Management: Off Volatren and NSAIDS.   -oxycontin  -baclofen for cramps/spasms  -increased gabapentin for neuropathic pain   -improved overall  4. Mood: Monitor as mentation improves. Will have LCSW follow for evaluation and support.  5. Neuropsych: This patient is capable of making decisions on his own behalf.  6.  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 HTN: Continue metoprolol and lasix. Cards recommendations: if needed to add norvasc and resume ACE if renal status stable.   -added 5mg  norvasc with improvement 9. Acute on chronic diastolic CHF: Daily weights. Low salt diet. Patient non compliant at home--will have RD educate on appropriate diet.  10. Hypotension with AKI: BUN holding around 50 11. BPH: foley d/c'ed Off cardura and ditropan at this time. Monitor voiding.  12. DM type 2 diet controlled:  Hgb A1c-7.0. BS poorly controlled due to stress/infection requiring lantus. Will monitor BS with ac/hs checks and start patient on oral agent. amaryl now at 4mg .   -dc lantus 13. Insomnia with RLS: Continue Mirapex. Sleep overall improved with better bed 14. Constipation:  bowel program.  LOS (Days) 12 A FACE TO FACE EVALUATION WAS PERFORMED  Alyssah Algeo T 07/30/2014 8:17 AM

## 2014-07-30 NOTE — Progress Notes (Signed)
Physical Therapy Session Note  Patient Details  Name: Travis [name] MRN: [id number] Date of Birth: 1946/03/30  Today's Date: 07/30/2014 Time: 1100-1200 Time Calculation (min): 60 min  Short Term Goals: Week 2:  PT Short Term Goal 1 (Week 2): Pt to perform bed mobility with min Ax1person PT Short Term Goal 2 (Week 2): Pt to perform bed<>w/c transfers with max Ax1person PT Short Term Goal 3 (Week 2): Pt will demonstrate w/c propulsion x150' at mod(I) level PT Short Term Goal 4 (Week 2): Pt will ambulate 60' w/ RW and mod Ax1person PT Short Term Goal 5 (Week 2): Pt will negotiation up/down 2 steps + single rail with mod A x1person  Skilled Therapeutic Interventions/Progress Updates:    Pt received seated in w/c utilizing urinal, agreeable to participate in therapy. Pt propelled w/c 150' to rehab gym. Pt amblated 20' w/ RW w/ ModA. Pt noted to have increased foot drag and decreased hip flexion during swing on LLE. Therapist applied ace wrap for hip flexion/knee flexion/dorsiflexion during swing. Pt then ambulated 20' w/ RW and ModA for weight shifting, noted decreased foot drag and increased step length on LLE w/ Ace wrap. Pt had Ace wrap donned for remainder of session. Pt negotiated up/down 2 stairs w/ MaxA w/ B rails. Pt required assist for foot placement, weight shifting, and upright positioning. Pt required extended seated rest break after ambulation and stairs. Pt ambulated 25' w/ RW w/ ModA, then performed x4 sit<>stands from elevated mat table w/ RW and +2 Assist. Pt requires increased assist to move sit>stand, then requires decreased assist to maintain standing and perform upright mobility. Pt would benefit from use of STEDI lift for transfers in room to decrease burden of care. Pt performed x3 mini-squats standing at edge of mat, could not continue due to weakness in legs. After rest break, pt ambulated 20' w/ RW w/ MinA for weight shifting and tactile cueing for quad activity in L stance  phase. Pt very fatigued at end of session. TotalA for w/c propulsion back to room, pt left seated in w/c w/ QRB on w/ all needs within reach.  Therapy Documentation Precautions:  Precautions Precautions: Fall Precaution Comments: chronic back pain - performs bending with no B,L,T and log rolls Restrictions Weight Bearing Restrictions: No Pain: Pain Assessment Pain Assessment: No/denies pain Pain Score: 0-No pain  See FIM for current functional status  Therapy/Group: Individual Therapy  Rada Hay Rada Hay, PT, DPT 07/30/2014, 12:52 PM

## 2014-07-30 NOTE — Progress Notes (Signed)
RT Note: Pt has home CPAP at bedside. Machine turned on and H2O filled he stated he can place his mask on when he is ready. I told him to call if he needs any help. Rt will continue to monitor

## 2014-07-31 ENCOUNTER — Inpatient Hospital Stay (HOSPITAL_COMMUNITY): Payer: Medicare Other

## 2014-07-31 ENCOUNTER — Inpatient Hospital Stay (HOSPITAL_COMMUNITY): Payer: Medicare Other | Admitting: Physical Therapy

## 2014-07-31 ENCOUNTER — Encounter (HOSPITAL_COMMUNITY): Payer: Medicare Other | Admitting: Occupational Therapy

## 2014-07-31 DIAGNOSIS — Z5189 Encounter for other specified aftercare: Secondary | ICD-10-CM

## 2014-07-31 DIAGNOSIS — M4712 Other spondylosis with myelopathy, cervical region: Secondary | ICD-10-CM

## 2014-07-31 DIAGNOSIS — R5381 Other malaise: Secondary | ICD-10-CM

## 2014-07-31 DIAGNOSIS — I5031 Acute diastolic (congestive) heart failure: Secondary | ICD-10-CM

## 2014-07-31 DIAGNOSIS — J189 Pneumonia, unspecified organism: Secondary | ICD-10-CM

## 2014-07-31 DIAGNOSIS — A419 Sepsis, unspecified organism: Secondary | ICD-10-CM

## 2014-07-31 LAB — GLUCOSE, CAPILLARY
GLUCOSE-CAPILLARY: 117 mg/dL — AB (ref 70–99)
GLUCOSE-CAPILLARY: 111 mg/dL — AB (ref 70–99)
Glucose-Capillary: 146 mg/dL — ABNORMAL HIGH (ref 70–99)
Glucose-Capillary: 114 mg/dL — ABNORMAL HIGH (ref 70–99)

## 2014-07-31 NOTE — Progress Notes (Signed)
Occupational Therapy Session Note  Patient Details  Name: Travis [name] MRN: [id number] Date of Birth: [birth year]  Today's Date: 07/31/2014 Time: [id number] Time Calculation (min): 34 min  Short Term Goals: Week 2:  OT Short Term Goal 1 (Week 2): Pt. will perform bathing , seated and standing, with Mod A  in order to increase I in self care.  OT Short Term Goal 2 (Week 2): Pt. will engage in 20 minutes of functional activity without requiring rest break in order to increase endurance for occupational performance. OT Short Term Goal 3 (Week 2): Pt. will perform LB dressing with Min A in order to increase I with ADLs.  OT Short Term Goal 4 (Week 2): Pt. will perform sit to stand from wheelchair height with Mod A in order to increase functional mobility.  OT Short Term Goal 5 (Week 2): Pt. will perform supine to sit with min A in order to  increase I  in bed mobility.  Skilled Therapeutic Interventions/Progress Updates:    Pt seen for 1:1 OT session with focus on shower transfer and sit<>stand. Pt received sitting in w/c with wife present and eager for therapy session. Propelled self in w/c ~100' for BUE strengthening. Discussed setup of bathroom at home and wife unsure if w/c will fit. Asked her to take measurements of widths of doorway and from cabinets>wall. Practiced shower transfer with wooden box to simulate threshold to shower. Pt required mod assist for squat pivot transfer w/c<>shower 3x. Engaged in sit<>stand at parallel bars with emphasis on BLE strengthening to increase independence in functional transfers and LB self-care. Completed mini squats from standing 12x total. Pt required mod assist for sit>stand and squats. Pt required 4 rest breaks throughout session with slight SOB noted. Used pursed lip breathing techniques with min cues.  Discussed exercises to complete in room between therapy sessions and pt very eager to continue "working." Pt returned to room and left sitting in  w/c.  Therapy Documentation Precautions:  Precautions Precautions: Fall Precaution Comments: chronic back pain - performs bending with no B,L,T and log rolls Restrictions Weight Bearing Restrictions: No General:   Vital Signs: Therapy Vitals Pulse Rate: 72 Resp: 17 BP: 126/60 mmHg Patient Position (if appropriate): Sitting Oxygen Therapy SpO2: 97 % O2 Device: None (Room air) Pain:  No report of pain during therapy session.   See FIM for current functional status  Therapy/Group: Individual Therapy  Duayne Cal 07/31/2014, 3:13 PM

## 2014-07-31 NOTE — Progress Notes (Signed)
[facility] PHYSICAL MEDICINE & REHABILITATION     PROGRESS NOTE    Subjective/Complaints: Didn't fall asleep until around 4am when he took something. A little slow to get going this am.  Objective: Vital Signs: Blood pressure 146/61, pulse 56, temperature 98.5 F (36.9 C), temperature source Axillary, resp. rate 18, height 6\' 1"  (1.854 m), weight 119.75 kg (264 lb), SpO2 98.00%. No results found. No results found for this basename: WBC, HGB, HCT, PLT,  in the last 72 hours No results found for this basename: NA, K, CL, CO, GLUCOSE, BUN, CREATININE, CALCIUM,  in the last 72 hours CBG (last 3)   Recent Labs  07/30/14 1623 07/30/14 2048 07/31/14 0718  GLUCAP 180* 131* 117*    Wt Readings from Last 3 Encounters:  07/31/14 119.75 kg (264 lb)  07/18/14 119.8 kg (264 lb 1.8 oz)  03/13/14 131.09 kg (289 lb)    Physical Exam:  Constitutional: He is oriented to person, place, and time. He appears well-developed and well-nourished. Side lying in bed  HENT: dentition fair. Oral mucosa pink/moist  Head: Normocephalic and atraumatic.  Eyes: Conjunctivae are normal. Pupils are equal, round, and reactive to light.  Neck: Normal range of motion. Neck supple.  Cardiovascular: Normal rate and regular rhythm. No murmurs  Respiratory: Effort normal and breath sounds normal. No respiratory distress. He has wheezes in upper airway.  GI: Soft. Bowel sounds are normal. He exhibits no distension. There is no tenderness.  Musculoskeletal: continued trace edema in both legs.. Has low back pain with minimal lower extremity movement and with bed mobility Neurological: He is alert and oriented to person, place, and time. Appears rested    Able to follow basic commands without difficulty. Dysesthesias bilateral feet CN exam normal.  extremity: Intrinsic atrophy bilateral hands.  Motor strength is 4 minus bilateral grip 5 bilateral biceps and deltoid, 4/5 left triceps 4/5 right tricep, wrist extension 4+   2 minus hip flexion knee extension ankle dorsiflexion and plantarflexion  Sensation intact to light touch in bilateral upper and lower extremities except for decreased proprioception in bilateral feet.  Deep tendon reflexes 2+ bilateral knees trace bilateral biceps and brachioradialis and triceps, 1+ patella,achilles  Ankles without clonus  Skin: chronic changes on feet/ dry, scaly skin, small reddened area left buttock,  stage 2 right buttock. Back surgery site intact  Psych: pleasant, appropriate. Fair insight  Assessment/Plan: 1. Functional deficits secondary to deconditioning, hx of cervical myelopathy which require 3+ hours per day of interdisciplinary therapy in a comprehensive inpatient rehab setting. Physiatrist is providing close team supervision and 24 hour management of active medical problems listed below. Physiatrist and rehab team continue to assess barriers to discharge/monitor patient progress toward functional and medical goals.    FIM: FIM - Bathing Bathing Steps Patient Completed: Chest;Right Arm;Left Arm;Abdomen;Front perineal area;Buttocks;Right upper leg;Left upper leg Bathing: 4: Min-Patient completes 8-9 33f 10 parts or 75+ percent  FIM - Upper Body Dressing/Undressing Upper body dressing/undressing steps patient completed: Thread/unthread right sleeve of pullover shirt/dresss;Thread/unthread left sleeve of pullover shirt/dress;Put head through opening of pull over shirt/dress;Pull shirt over trunk Upper body dressing/undressing: 5: Set-up assist to: Obtain clothing/put away FIM - Lower Body Dressing/Undressing Lower body dressing/undressing steps patient completed: Thread/unthread left underwear leg;Pull underwear up/down;Thread/unthread left pants leg;Pull pants up/down;Fasten/unfasten pants;Fasten/unfasten right shoe;Fasten/unfasten left shoe Lower body dressing/undressing: 3: Mod-Patient completed 50-74% of tasks  FIM - Toileting Toileting steps completed by  patient: Adjust clothing prior to toileting;Performs perineal hygiene;Adjust clothing after toileting Toileting  Assistive Devices:  (walker) Toileting: 4: Steadying assist  FIM - Radio producer Devices: Research officer, trade union Transfers: 0-Activity did not occur  FIM - Control and instrumentation engineer Devices: Copy: 4: Bed > Chair or W/C: Min A (steadying Pt. > 75%);3: Chair or W/C > Bed: Mod A (lift or lower assist)  FIM - Locomotion: Wheelchair Distance: 350 Locomotion: Wheelchair: 5: Travels 150 ft or more: maneuvers on rugs and over door sills with supervision, cueing or coaxing FIM - Locomotion: Ambulation Locomotion: Ambulation Assistive Devices: Administrator Ambulation/Gait Assistance: 4: Min assist Locomotion: Ambulation: 1: Travels less than 50 ft with moderate assistance (Pt: 50 - 74%)  Comprehension Comprehension Mode: Auditory Comprehension: 5-Understands complex 90% of the time/Cues < 10% of the time  Expression Expression Mode: Verbal Expression: 6-Expresses complex ideas: With extra time/assistive device  Social Interaction Social Interaction: 6-Interacts appropriately with others with medication or extra time (anti-anxiety, antidepressant).  Problem Solving Problem Solving: 4-Solves basic 75 - 89% of the time/requires cueing 10 - 24% of the time  Memory Memory Mode: Not assessed Memory: 5-Recognizes or recalls 90% of the time/requires cueing < 10% of the time  Medical Problem List and Plan:  1. Functional deficits secondary to Cervical myelopathy with deconditioning after pneumonia requiring intubation  2. DVT Prophylaxis/Anticoagulation: Pharmaceutical: Lovenox  3. Chronic Pain Management: Off Volatren and NSAIDS.   -oxycontin  -baclofen for cramps/spasms  -maintain gabapentin for neuropathic pain   -improved overall  4. Mood: Monitor as mentation improves. Will have LCSW  follow for evaluation and support.  5. Neuropsych: This patient is capable of making decisions on his own behalf.  6. 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 HTN: Continue metoprolol and lasix. Cards recommendations: if needed to add norvasc and resume ACE if renal status stable.   -added 5mg  norvasc with improvement 9. Acute on chronic diastolic CHF: Daily weights. Low salt diet. Patient non compliant at home--will have RD educate on appropriate diet.  10. Hypotension with AKI: BUN holding around 50 11. BPH: foley d/c'ed Off cardura and ditropan at this time. Monitor voiding.  12. DM type 2 diet controlled:  Hgb A1c-7.0. BS poorly controlled due to stress/infection requiring lantus. Will monitor BS with ac/hs checks and start patient on oral agent. amaryl now at 4mg .   -dc'ed lantus and sugars remain reasonable 13. Insomnia with RLS: Continue Mirapex. Sleep overall improved with better bed  -encouraged to ask for something earlier for sleep if needed 14. Constipation:  bowel program.  LOS (Days) 13 A FACE TO FACE EVALUATION WAS PERFORMED  SWARTZ,ZACHARY T 07/31/2014 8:36 AM

## 2014-07-31 NOTE — Progress Notes (Signed)
Physical Therapy Session Note  Patient Details  Name: Travis [name] MRN: [id number] Date of Birth: January 04, 1946  Today's Date: 07/31/2014 Time: 1002-1106 Time Calculation (min): 64 min  Short Term Goals: Week 1:  PT Short Term Goal 1 (Week 1): Pt will demonstrate log rolling in bed with rail req min A.  PT Short Term Goal 1 - Progress (Week 1): Met PT Short Term Goal 2 (Week 1): Pt will demonstrate side lie to sit transfer with rail req mod A.  PT Short Term Goal 2 - Progress (Week 1): Met PT Short Term Goal 3 (Week 1): Pt will demonstrate sit to stand with RW req max A.  PT Short Term Goal 3 - Progress (Week 1): Progressing toward goal PT Short Term Goal 4 (Week 1): Pt will demonstrate w/c propulsion x 100' req SBA.  PT Short Term Goal 4 - Progress (Week 1): Met PT Short Term Goal 5 (Week 1): Pt will demonstrate ambulation in // bars x 10' req mod A.  PT Short Term Goal 5 - Progress (Week 1): Met Week 2:  PT Short Term Goal 1 (Week 2): Pt to perform bed mobility with min Ax1person PT Short Term Goal 2 (Week 2): Pt to perform bed<>w/c transfers with max Ax1person PT Short Term Goal 3 (Week 2): Pt will demonstrate w/c propulsion x150' at mod(I) level PT Short Term Goal 4 (Week 2): Pt will ambulate 43' w/ RW and mod Ax1person PT Short Term Goal 5 (Week 2): Pt will negotiation up/down 2 steps + single rail with mod A x1person  Skilled Therapeutic Interventions/Progress Updates:  AM Session (64 min): Pt. Received seated on toilet with sara for transfer in front of patient. Completed toileting, donned shorts/briefs max A, transferred to w/c using sara steady. No c/o pain.  2:1 Treatment focused on transfers, NMR, and gait. Pt. Assist level is min-mod A with transfers using RW, min A with gait using RW, and (S) with w/c mgmt. Treatment consisted of NMR for sit<>stand function, proper sequencing, and overall strengthening with verbal and tactile cues for proper posture, limb translation, and  maintaining knee extension in static standing. Pt. Tolerated initial transfer activity well, with performance decrease commensurate with fatigue. Dynamic standing activity with RW with 4" step-tap activity utilizing block learning x10 ea LE with emphasis on proximal muscle control and weight shifting for functional application to gait/stairs. Pt. Ambulated 40 feet with RW and w/c follow. Pt. Required +2A with increased fatigue for all activities and should be maintained for patient safety. Pt. Propelled w/c using BUE with limited BLE use 70 feet, and 60 feet with BUE only; pt. (S) for w/c mgmt.  Pt. Condition post therapy session: positioned seated in w/c with all needs within reach, and quick release in use for pt. safety.   PM Session (75 min): Pt. Received seated in w/c in hallway. Pt. Ready and agreeable to physical therapy.  2:1 Treatment focused on proximal BLE control and strengthening, NMR/gait sequencing, functional transfers (sit<>Stand) and self care.  bars for lateral stepping for increased weight shifting, and activation of hip abductors; pt. Hip hikes into lateral stepping and will need to perform exercises in static stance. Pt. Performed x8 sit<>stand with x2 therapists for increased NDT for "swoop" stand and sequencing utilizing max A tactile cues and frequent verbal cues for task performance/completion. Pt. Assist level is min-mod A with transfers using RW, min A with gait using RW, and (S) with w/c mgmt. +2A for stand<>pivot w/c <>toilet without  RW using grab rails and w/c armrests; two therapists for safety and patient management with frequent verbal cues and tactile cues. Pt. Required Max A transfers x2 with single therapist for doffing soiled shorts/briefs and donning clean clothing sitting EOB with sit<>stand to static stance with therapist for completion of task. Pt. Transferred EOB to w/c to end session with squat-pivot transfer. Pt. Fatigued near end of session and required  additional assistance with pericare and skin protection. Pt. States he will "sleep well tonight having done so much work."  Pt. Condition post therapy session: positioned seated in w/c with leg rests positioned with all needs within reach; quick release in use for pt. safety.   Therapy Documentation Precautions:  Precautions Precautions: Fall Precaution Comments: chronic back pain - performs bending with no B,L,T and log rolls Restrictions Weight Bearing Restrictions: No Pain: Pain Assessment Pain Score: 0-No pain  See FIM for current functional status  Therapy/Group: Co-Treatment  Juluis Mire 07/31/2014, 12:56 PM

## 2014-07-31 NOTE — Progress Notes (Signed)
Occupational Therapy Session Note  Patient Details  Name: Travis [name] MRN: [id number] Date of Birth: [birth year]  Today's Date: 07/31/2014 Time: 0902-0948 Time Calculation (min): 46 min  Short Term Goals: Week 2:  OT Short Term Goal 1 (Week 2): Pt. will perform bathing , seated and standing, with Mod A  in order to increase I in self care.  OT Short Term Goal 2 (Week 2): Pt. will engage in 20 minutes of functional activity without requiring rest break in order to increase endurance for occupational performance. OT Short Term Goal 3 (Week 2): Pt. will perform LB dressing with Min A in order to increase I with ADLs.  OT Short Term Goal 4 (Week 2): Pt. will perform sit to stand from wheelchair height with Mod A in order to increase functional mobility.  OT Short Term Goal 5 (Week 2): Pt. will perform supine to sit with min A in order to  increase I  in bed mobility.  Skilled Therapeutic Interventions/Progress Updates:    Engaged in ADL retraining with focus on overall activity tolerance, sit > stand, and standing balance during self-care tasks.  Pt in w/c upon arrival, performed squat pivot transfer to tub bench in walk-in shower with mod assist with use of grab bar.  Pt utilized lateral leans for LB bathing.  Sit > stand from w/c to RW with mod assist to complete LB dressing, pt required physical assistance to pull pants over hips as well as to thread BLE.  Introduced Secondary school teacher to assist with LB dressing, to which pt reports having tried but was unsuccessful and not interested in attempting this session.  Pt unable to cross leg over opposite leg for LB dressing this session.  Therapy Documentation Precautions:  Precautions Precautions: Fall Precaution Comments: chronic back pain - performs bending with no B,L,T and log rolls Restrictions Weight Bearing Restrictions: No General:   Vital Signs: Therapy Vitals Pulse Rate: 56 BP: 146/61 mmHg Pain: Pain Assessment Pain Assessment:  0-10 Pain Score: 0-No pain ADL: ADL ADL Comments: see FIM  See FIM for current functional status  Therapy/Group: Individual Therapy  [name] 07/31/2014, 10:11 AM

## 2014-08-01 ENCOUNTER — Ambulatory Visit (HOSPITAL_COMMUNITY): Payer: Medicare Other | Admitting: *Deleted

## 2014-08-01 ENCOUNTER — Inpatient Hospital Stay (HOSPITAL_COMMUNITY): Payer: Medicare Other | Admitting: Occupational Therapy

## 2014-08-01 ENCOUNTER — Inpatient Hospital Stay (HOSPITAL_COMMUNITY): Payer: Medicare Other

## 2014-08-01 ENCOUNTER — Encounter (HOSPITAL_COMMUNITY): Payer: Medicare Other | Admitting: Occupational Therapy

## 2014-08-01 DIAGNOSIS — J189 Pneumonia, unspecified organism: Secondary | ICD-10-CM

## 2014-08-01 DIAGNOSIS — M4712 Other spondylosis with myelopathy, cervical region: Secondary | ICD-10-CM

## 2014-08-01 DIAGNOSIS — Z5189 Encounter for other specified aftercare: Secondary | ICD-10-CM

## 2014-08-01 DIAGNOSIS — R5381 Other malaise: Secondary | ICD-10-CM

## 2014-08-01 DIAGNOSIS — I5031 Acute diastolic (congestive) heart failure: Secondary | ICD-10-CM

## 2014-08-01 DIAGNOSIS — A419 Sepsis, unspecified organism: Secondary | ICD-10-CM

## 2014-08-01 LAB — GLUCOSE, CAPILLARY
GLUCOSE-CAPILLARY: 94 mg/dL (ref 70–99)
GLUCOSE-CAPILLARY: 121 mg/dL — AB (ref 70–99)
Glucose-Capillary: 160 mg/dL — ABNORMAL HIGH (ref 70–99)
Glucose-Capillary: 122 mg/dL — ABNORMAL HIGH (ref 70–99)

## 2014-08-01 NOTE — Progress Notes (Signed)
Physical Therapy Session Note  Patient Details  Name: Travis [name] MRN: [id number] Date of Birth: 1946-03-04  Today's Date: 08/01/2014 Time: 0832-0932 Time Calculation (min): 60 min  Short Term Goals: Week 1:  PT Short Term Goal 1 (Week 1): Pt will demonstrate log rolling in bed with rail req min A.  PT Short Term Goal 1 - Progress (Week 1): Met PT Short Term Goal 2 (Week 1): Pt will demonstrate side lie to sit transfer with rail req mod A.  PT Short Term Goal 2 - Progress (Week 1): Met PT Short Term Goal 3 (Week 1): Pt will demonstrate sit to stand with RW req max A.  PT Short Term Goal 3 - Progress (Week 1): Progressing toward goal PT Short Term Goal 4 (Week 1): Pt will demonstrate w/c propulsion x 100' req SBA.  PT Short Term Goal 4 - Progress (Week 1): Met PT Short Term Goal 5 (Week 1): Pt will demonstrate ambulation in // bars x 10' req mod A.  PT Short Term Goal 5 - Progress (Week 1): Met Week 2:  PT Short Term Goal 1 (Week 2): Pt to perform bed mobility with min Ax1person PT Short Term Goal 1 - Progress (Week 2): Met PT Short Term Goal 2 (Week 2): Pt to perform bed<>w/c transfers with max Ax1person PT Short Term Goal 2 - Progress (Week 2): Met PT Short Term Goal 3 (Week 2): Pt will demonstrate w/c propulsion x150' at mod(I) level PT Short Term Goal 4 - Progress (Week 2): Met PT Short Term Goal 4 (Week 2): Pt will ambulate 71' w/ RW and mod Ax1person PT Short Term Goal 5 (Week 2): Pt will negotiation up/down 2 steps + single rail with mod A x1person  Skilled Therapeutic Interventions/Progress Updates:  AM Session (70 min): Pt. Received seated in w/c with QRB donned. Pt. Agreeable to physical therapy.  2:1 Printmaker with shadowing student) Treatment focused on community w/c mgmt, ambulation/gait, transfers. Pt. Assist level is max A to +2A for transfers with low surface with Bari RW. Pt. W/c propulsion on uncontroled surfaces, inclines/declines, and variable  surfaces: indoor and outdoor for >2000 feet with Min-mod A for difficult surfaces. Pt. Required occasional rest/recovery breaks during session; required max A from therapist for steep inclines and at fatigue of bilateral UEs. Pt. W/c mgmt increasing and pt. Provided protective fingerless gloves for hand protection during propulsion.  Pain: no c/o pain.  Pt. Condition post therapy session: positioned seated in w/c with food tray in front of patient and all needs within reach. RN tech present and testing glucose prior to eating lunch.    PM Session (20 min): Pt. Received in hall with OT hand-off. Pt. Agreeable to physical therapy session.  1:1 Treatment focused on bilateral LE strengthening and increased hip flexion. Pt. Assist level is max A with RW/therapist. Treatment consisted of 12 min NUSTEP lvl. 3 with bilateral LEs only in alternated deep and moderate hip flexion positions. Pt. Required additional therapeutic rest breaks during session and was unable to complete 12 minutes in continuous manner. Pt. Performed x2 squat-pivot transfers during session with Min to Mod A with therapist set-up. Pt. Demonstrated increased activity tolerance with reduced resistance levels during NUSTEP lvl 5 to lvl 3.   Pain: No c/o pain; pt indicated HS soreness with exercise secession.  Pt. Condition post therapy session: positioned seated in w/c with all needs within reach. Daughter-in-law Travis [name]) present and visiting with patient; was present for last 5 minutes  of therapy session.  Therapy Documentation Precautions:  Precautions Precautions: Fall Precaution Comments: chronic back pain - performs bending with no B,L,T and log rolls Restrictions Weight Bearing Restrictions: No General:   Vital Signs:   Pain:   Mobility:   Locomotion :    Trunk/Postural Assessment :    Balance:   Exercises:   Other Treatments:    See FIM for current functional status  Therapy/Group: Individual  Therapy  Travis [name] 08/01/2014, 12:11 PM

## 2014-08-01 NOTE — Plan of Care (Signed)
Problem: RH BLADDER ELIMINATION Goal: RH STG MANAGE BLADDER WITH ASSISTANCE STG Manage Bladder With Assistance. Mod I  Outcome: Progressing Helper empties urinal

## 2014-08-01 NOTE — Progress Notes (Signed)
Recreational Therapy Session Note  Patient Details  Name: Travis [name] MRN: [id number] Date of Birth: [birth year] Today's Date: 08/01/2014  Pain: c/o pain on bottom, education on repositioning& pressure relief Skilled Therapeutic Interventions/Progress Updates: Session focused on activity tolerance, w/c mobility during community pursuits.   Pt propelled w/c >1000' using BUE's with supervision-min assist.  Pt is extremely motivated.  Pt requires mod verbal cues for breathing technique with activity, pt tends to hold his breath.  Also discussed & demonstrated pressure relief techniques, needs further eduction/reinforcement.  Therapy/Group: Co-Treatment  Elizibeth Breau 08/01/2014, 3:19 PM

## 2014-08-01 NOTE — Progress Notes (Signed)
[facility] PHYSICAL MEDICINE & REHABILITATION     PROGRESS NOTE    Subjective/Complaints: Had a better night. Encouraged by therapy progress. Denies pain, sob, cough  Objective: Vital Signs: Blood pressure 169/84, pulse 67, temperature 97.3 F (36.3 C), temperature source Oral, resp. rate 18, height 6\' 1"  (1.854 m), weight 118.389 kg (261 lb), SpO2 94.00%. No results found. No results found for this basename: WBC, HGB, HCT, PLT,  in the last 72 hours No results found for this basename: NA, K, CL, CO, GLUCOSE, BUN, CREATININE, CALCIUM,  in the last 72 hours CBG (last 3)   Recent Labs  07/31/14 1700 07/31/14 2227 08/01/14 0716  GLUCAP 111* 114* 160*    Wt Readings from Last 3 Encounters:  08/01/14 118.389 kg (261 lb)  07/18/14 119.8 kg (264 lb 1.8 oz)  03/13/14 131.09 kg (289 lb)    Physical Exam:  Constitutional: He is oriented to person, place, and time. He appears well-developed and well-nourished. Side lying in bed  HENT: dentition fair. Oral mucosa pink/moist  Head: Normocephalic and atraumatic.  Eyes: Conjunctivae are normal. Pupils are equal, round, and reactive to light.  Neck: Normal range of motion. Neck supple.  Cardiovascular: Normal rate and regular rhythm. No murmurs  Respiratory: Effort normal and breath sounds normal. No respiratory distress. He has wheezes in upper airway.  GI: Soft. Bowel sounds are normal. He exhibits no distension. There is no tenderness.  Musculoskeletal: minimal edema in both legs.. Has low back pain with minimal lower extremity movement and with bed mobility Neurological: He is alert and oriented to person, place, and time. Appears rested    Able to follow basic commands without difficulty. Dysesthesias bilateral feet. CN exam normal.  extremity: Intrinsic atrophy bilateral hands.  Motor strength is 4 minus bilateral grip 5 bilateral biceps and deltoid, 4/5 left triceps 4/5 right tricep, wrist extension 4+  2 minus hip flexion knee  extension ankle dorsiflexion and plantarflexion  Sensation intact to light touch in bilateral upper and lower extremities except for decreased proprioception in bilateral feet.  Deep tendon reflexes 2+ bilateral knees trace bilateral biceps and brachioradialis and triceps, 1+ patella,achilles  Ankles without clonus  Skin: chronic changes on feet/ dry, scaly skin, small reddened area left buttock,  stage 2 right buttock. Back surgery site intact  Psych: pleasant, appropriate. Improved insight  Assessment/Plan: 1. Functional deficits secondary to deconditioning, hx of cervical myelopathy which require 3+ hours per day of interdisciplinary therapy in a comprehensive inpatient rehab setting. Physiatrist is providing close team supervision and 24 hour management of active medical problems listed below. Physiatrist and rehab team continue to assess barriers to discharge/monitor patient progress toward functional and medical goals.    FIM: FIM - Bathing Bathing Steps Patient Completed: Chest;Right Arm;Left Arm;Abdomen;Front perineal area;Buttocks;Right upper leg;Left upper leg Bathing: 4: Min-Patient completes 8-9 63f 10 parts or 75+ percent  FIM - Upper Body Dressing/Undressing Upper body dressing/undressing steps patient completed: Thread/unthread right sleeve of pullover shirt/dresss;Thread/unthread left sleeve of pullover shirt/dress;Put head through opening of pull over shirt/dress;Pull shirt over trunk Upper body dressing/undressing: 5: Set-up assist to: Obtain clothing/put away FIM - Lower Body Dressing/Undressing Lower body dressing/undressing steps patient completed: Don/Doff right shoe;Don/Doff left shoe Lower body dressing/undressing: 2: Max-Patient completed 25-49% of tasks  FIM - Toileting Toileting steps completed by patient: Adjust clothing prior to toileting;Performs perineal hygiene;Adjust clothing after toileting Toileting Assistive Devices: Grab bar or rail for  support Toileting: 4: Steadying assist  FIM - Air cabin crew  Transfers Assistive Devices: Product manager Transfers: 1-Two helpers  FIM - Control and instrumentation engineer Devices: Copy: 4: Bed > Chair or W/C: Min A (steadying Pt. > 75%);3: Chair or W/C > Bed: Mod A (lift or lower assist)  FIM - Locomotion: Wheelchair Distance: 300 Locomotion: Wheelchair: 5: Travels 150 ft or more: maneuvers on rugs and over door sills with supervision, cueing or coaxing FIM - Locomotion: Ambulation Locomotion: Ambulation Assistive Devices: Administrator Ambulation/Gait Assistance: 4: Min assist Locomotion: Ambulation: 1: Travels less than 50 ft with minimal assistance (Pt.>75%)  Comprehension Comprehension Mode: Auditory Comprehension: 5-Understands complex 90% of the time/Cues < 10% of the time  Expression Expression Mode: Verbal Expression: 5-Expresses complex 90% of the time/cues < 10% of the time  Social Interaction Social Interaction: 5-Interacts appropriately 90% of the time - Needs monitoring or encouragement for participation or interaction.  Problem Solving Problem Solving: 4-Solves basic 75 - 89% of the time/requires cueing 10 - 24% of the time  Memory Memory Mode: Not assessed Memory: 5-Recognizes or recalls 90% of the time/requires cueing < 10% of the time  Medical Problem List and Plan:  1. Functional deficits secondary to Cervical myelopathy with deconditioning after pneumonia requiring intubation  2. DVT Prophylaxis/Anticoagulation: Pharmaceutical: Lovenox  3. Chronic Pain Management: Off Volatren and NSAIDS.   -oxycontin  -baclofen for cramps/spasms  -maintain gabapentin for neuropathic pain   -improved overall  4. Mood: Monitor as mentation improves. Will have LCSW follow for evaluation and support.  5. Neuropsych: This patient is capable of making decisions on his own behalf.  6. 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 HTN: Continue metoprolol and lasix. Cards recommendations: if needed to add norvasc and resume ACE if renal status stable.   -added 5mg  norvasc with improvement 9. Acute on chronic diastolic CHF: Daily weights. Low salt diet. Patient non compliant at home--will have RD educate on appropriate diet.  10. Hypotension with AKI: BUN holding around 50 11. BPH: foley d/c'ed Off cardura and ditropan at this time. Monitor voiding.  12. DM type 2 diet controlled:  Hgb A1c-7.0. BS poorly controlled due to stress/infection requiring lantus. Will monitor BS with ac/hs checks and start patient on oral agent. amaryl now at 4mg .   -dc'ed lantus   13. Insomnia with RLS: Continue Mirapex. Sleep overall improved with better bed    14. Constipation:  bowel program.  LOS (Days) 14 A FACE TO FACE EVALUATION WAS PERFORMED  Therasa Lorenzi T 08/01/2014 8:47 AM

## 2014-08-01 NOTE — Progress Notes (Signed)
Occupational Therapy Session Notes  Patient Details  Name: Travis [name] MRN: [id number] Date of Birth: Jan 02, 1946  Today's Date: 08/01/2014  Short Term Goals: Week 1:  OT Short Term Goal 1 (Week 1): Pt. will perform bathing , seated and standing, with Max A  in order to increase I in self care.  OT Short Term Goal 1 - Progress (Week 1): Met OT Short Term Goal 2 (Week 1): Pt. will engage in 10 minutes of functional activity without requiring rest break in order to increase endurance for occupational performance. OT Short Term Goal 2 - Progress (Week 1): Met OT Short Term Goal 3 (Week 1): Pt. will perform LB dressing with Max A in order to increase I with ADLs.  OT Short Term Goal 3 - Progress (Week 1): Met OT Short Term Goal 4 (Week 1): Pt. will perform supine to sit with Max A in order to  increase I  in bed mobility. OT Short Term Goal 4 - Progress (Week 1): Met OT Short Term Goal 5 (Week 1): Pt. will perform static sitting balance at EOB with feet supported and supervision for 2 or more minutes in order to increase balance for occupational performance.  OT Short Term Goal 5 - Progress (Week 1): Met  Week 2:  OT Short Term Goal 1 (Week 2): Pt. will perform bathing , seated and standing, with Mod A  in order to increase I in self care.  OT Short Term Goal 2 (Week 2): Pt. will engage in 20 minutes of functional activity without requiring rest break in order to increase endurance for occupational performance. OT Short Term Goal 3 (Week 2): Pt. will perform LB dressing with Min A in order to increase I with ADLs.  OT Short Term Goal 4 (Week 2): Pt. will perform sit to stand from wheelchair height with Mod A in order to increase functional mobility.  OT Short Term Goal 5 (Week 2): Pt. will perform supine to sit with min A in order to  increase I  in bed mobility.  Skilled Therapeutic Interventions/Progress Updates:   Session #1 [phone] - 55 Minutes Individual Therapy No complaints of  pain Patient received seated in w/c with daughter-in-law present. Patient maneuvered self into bathroom with assistance from therapist. Patient then transferred w/c > tub transfer bench with minimal assistance. Patient completed UB/LB bathing in seated position (lateral leans for peri cleansing) with close supervision for safety. Patient dried, then transferred bench > w/c with min assist. Patient then maneuvered self around room in w/c to gather clothes and for grooming tasks while seated in w/c. Patient with urine urgency and used urinal independently. Patient then with request to use BR, patient transferred on/off elevated toilet seat with close supervision; requiring min verbal cues for safety to call for assistance instead of transferring by himself. Patient then completed UB/LB dressing in sit<>stand position using Stedy, therapist donned bilateral TEDs. Focused skilled intervention on functional transfer, ADL retraining, energy conservation (including rest breaks & pursed lip breathing), activity tolerance/endurance, sit<>stands using Stedy. At end of session, left patient seated in w/c with quick release belt donned and all needs within reach.    Session #2 [id number] - 30 Minutes Individual Therapy No direct complaints of pain Patient received seated in w/c using urinal. Patient propelled self to sink and performed grooming task of washing hands while seated in w/c. From here, patient propelled self > dayroom. Patient with comment, "you're not going to give up on me before I  can walk again are you?". Therapist provided psychosocial support and talked with him about his progress so far on CIR. Patient engaged in therapeutic activity focusing on sit<>stands, dynamic standing balance/tolerance/endurance, and mini squats to strengthen BLEs to increase independence with self-care tasks from an ambulatory & standing level. Patient propelled self > therapy gym from here and therapist left patient with PT  for next therapy session.   Precautions:  Precautions Precautions: Fall Precaution Comments: chronic back pain - performs bending with no B,L,T and log rolls Restrictions Weight Bearing Restrictions: No  See FIM for current functional status  Lanard Arguijo 08/01/2014, 7:21 AM

## 2014-08-01 NOTE — Progress Notes (Signed)
Sterile water added to cpap. PT places himself on and off his home machine. He knows to call respiratory if he needs any assistance. RT will continue to monitor.

## 2014-08-02 ENCOUNTER — Inpatient Hospital Stay (HOSPITAL_COMMUNITY): Payer: Medicare Other | Admitting: Physical Therapy

## 2014-08-02 ENCOUNTER — Inpatient Hospital Stay (HOSPITAL_COMMUNITY): Payer: Medicare Other | Admitting: *Deleted

## 2014-08-02 ENCOUNTER — Inpatient Hospital Stay (HOSPITAL_COMMUNITY): Payer: Medicare Other | Admitting: Occupational Therapy

## 2014-08-02 DIAGNOSIS — R5381 Other malaise: Secondary | ICD-10-CM

## 2014-08-02 DIAGNOSIS — A419 Sepsis, unspecified organism: Secondary | ICD-10-CM

## 2014-08-02 DIAGNOSIS — M4712 Other spondylosis with myelopathy, cervical region: Secondary | ICD-10-CM

## 2014-08-02 DIAGNOSIS — J189 Pneumonia, unspecified organism: Secondary | ICD-10-CM

## 2014-08-02 DIAGNOSIS — Z5189 Encounter for other specified aftercare: Secondary | ICD-10-CM

## 2014-08-02 DIAGNOSIS — I5031 Acute diastolic (congestive) heart failure: Secondary | ICD-10-CM

## 2014-08-02 LAB — GLUCOSE, CAPILLARY
GLUCOSE-CAPILLARY: 97 mg/dL (ref 70–99)
Glucose-Capillary: 161 mg/dL — ABNORMAL HIGH (ref 70–99)
Glucose-Capillary: 105 mg/dL — ABNORMAL HIGH (ref 70–99)
Glucose-Capillary: 107 mg/dL — ABNORMAL HIGH (ref 70–99)
Glucose-Capillary: 157 mg/dL — ABNORMAL HIGH (ref 70–99)

## 2014-08-02 MED ORDER — TRAMADOL HCL 50 MG PO TABS
50.0000 mg | ORAL_TABLET | Freq: Four times a day (QID) | ORAL | Status: DC | PRN
  Administered 2014-08-05 – 2014-08-08 (×4): 50 mg via ORAL
  Filled 2014-08-02 (×5): qty 1

## 2014-08-02 NOTE — Progress Notes (Signed)
Social Work Patient ID: AMY BELLOSO, male   DOB: 09/06/1946, 68 y.o.   MRN: [id number]  Met with pt this afternoon and left VM for wife to review team conf info.  Pt aware that team continues to plan toward 8/12 discharge.  Feels he is continuing to make good progress.  Have asked wife to contact me back to discuss timing of family education prior to d/c.  Continue to follow.  Audley Hinojos, LCSW

## 2014-08-02 NOTE — Patient Care Conference (Signed)
Inpatient RehabilitationTeam Conference and Plan of Care Update Date: 08/01/2014   Time: 2:00 PM    Patient Name: Travis [name]      Medical Record Number: [id number]  Date of Birth: [birth year] Sex: Male         Room/Bed: [id number] Payor Info: Payor: [employer] [place] MEDICARE / Plan: BLUE MEDICARE / Product Type: *No Product type* /    Admitting Diagnosis: Deconditionig  s p  PNA  Admit Date/Time:  07/18/2014  5:51 PM Admission Comments: No comment available   Primary Diagnosis:  Cervical arthritis with myelopathy Principal Problem: Cervical arthritis with myelopathy  Patient Active Problem List   Diagnosis Date Noted  . Acute diastolic heart failure [id number]  . Cervical arthritis with myelopathy 07/18/2014  . Constipation - functional   . CAP (community acquired pneumonia) 07/10/2014  . Sepsis 07/10/2014  . HTN (hypertension) 07/10/2014  . OSA on CPAP 07/10/2014  . UTI (lower urinary tract infection) 07/10/2014  . Edema leg 07/10/2014  . Acute respiratory failure with hypoxia 07/10/2014  . 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 Other vitamin B12 deficiency anemia 07/13/2013  . 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 Polyneuropathy in other diseases classified elsewhere 02/17/2013  . Other general symptoms(780.99) 02/17/2013  . Abnormality of gait 02/17/2013  . Cervical spondylosis with myelopathy 02/17/2013    Expected Discharge Date: Expected Discharge Date: 08/09/14  Team Members Present: Physician leading conference: Dr. Alger Simons Social Worker Present: Lennart Pall, LCSW Nurse Present: Elliot Cousin, RN PT Present: Alwyn Pea, Lorriane Shire, PT OT Present: Chrys Racer, OT PPS Coordinator present : Daiva Nakayama, RN, CRRN;Becky Alwyn Ren, PT     Current Status/Progress Goal Weekly Team Focus  Medical   progressing in strength and stamina, more consistent with abilities in rehab  improved consistency  volume  mgt, maintain sleep, bp control   Bowel/Bladder   Continent of bowel and bladder. LBM: 07/31/2014  Remain continent of B&B  Continue to toilet patient PRN   Swallow/Nutrition/ Hydration             ADL's   supervision>max assist; patient fluctuates and is inconsistent with level of care needed  overall supervision>min assist, may upgraded goals to an overall supervision level  ADL retraining, sit<>stands, functional mobility, functional transfers, dynamic standing balance/tolerance/endurance, activity tolerance/endurance, energy conservation    Mobility   +2A for transfers, +2A for ambulation up to 50', Mod-I for W/C >150'  Min A-Supervision  NMR BLEs for gait/ambulation, transfers with/without RW/AD, overall activity endurance   Communication             Safety/Cognition/ Behavioral Observations            Pain   Scheduled Oxycodone 10mg  given at 2130. C/O back pain  Pain managed at or below 3  Assess and monitor pain; provide appropriate intervention   Skin   Stage 2 in sacrum. Gerhardt's cream applied  No new skin breakdown  Reposition q 2 hrs. Monitor for breakdown q shift    Rehab Goals Patient on target to meet rehab goals: Yes *See Care Plan and progress notes for long and short-term goals.  Barriers to Discharge: baseline weakness, safety    Possible Resolutions to Barriers:  safety ed/training, supervision goals    Discharge Planning/Teaching Needs:  home with wife as primary support and able to provide 24/7 supervision/ assist      Team Discussion:  Improved endurance overall and better sleep.  Need to  reach level that he can ambulate into bathroom at home as w/c will not fit.  Short distance amb now with minimal assist.  Stressing that he MUST remain active at home.  No longer requiring 2person to ambulate.  Improved cognition as well.  Revisions to Treatment Plan:  None - on target to reach functional goals.   Continued Need for Acute Rehabilitation Level of  Care: The patient requires daily medical management by a physician with specialized training in physical medicine and rehabilitation for the following conditions: Daily direction of a multidisciplinary physical rehabilitation program to ensure safe treatment while eliciting the highest outcome that is of practical value to the patient.: Yes Daily medical management of patient stability for increased activity during participation in an intensive rehabilitation regime.: Yes Daily analysis of laboratory values and/or radiology reports with any subsequent need for medication adjustment of medical intervention for : Neurological problems;Pulmonary problems  Rilee Knoll 08/02/2014, 8:37 AM

## 2014-08-02 NOTE — Progress Notes (Signed)
Physical Therapy Session Note  Patient Details  Name: Travis [name] MRN: [id number] Date of Birth: 08-01-1946   Today's Date: 08/02/2014 Time: 0830-0930 Time Calculation (min): 60 min  PT Short Term Goals Week 2:  PT Short Term Goal 1 (Week 2): Pt to perform bed mobility with min Ax1person PT Short Term Goal 1 - Progress (Week 2): Met PT Short Term Goal 2 (Week 2): Pt to perform bed<>w/c transfers with max Ax1person PT Short Term Goal 2 - Progress (Week 2): Met PT Short Term Goal 3 (Week 2): Pt will demonstrate w/c propulsion x150' at mod(I) level PT Short Term Goal 3 - Progress (Week 2): Met PT Short Term Goal 4 (Week 2): Pt will ambulate 50' w/ RW and mod Ax1person PT Short Term Goal 4 - Progress (Week 2): Met PT Short Term Goal 5 (Week 2): Pt will negotiation up/down 2 steps + single rail with mod A x1person PT Short Term Goal 5 - Progress (Week 2): Progressing toward goal  Skilled Therapeutic Interventions/Progress Updates:    Therapeutic Activity: PT instructs pt in scoot transfer w/c to/from bed req mod A for bottom clearance. PT instructs pt in sit to supine transfer with bedrail req SBA and verbal cues for leg management. Pt rolls in bed mod I with rail. Pt demonstrates L side lie to sit in bed with rail req SBA and verbal cues for encouragement. PT instructs pt in sit to stand req mod A. PT instructs pt in stand-step transfer w/c to/from mat req min A each direction and mod A sit to supine for B LE management.   W/C Management: PT instructs pt in circle stroke B UE propulsion 150' x 2 reps req hand over hand and progressing to verbal cues, including increased rest breaks in between strokes in order to conserve energy and coast. Pt demonstrates mod I and increased time req for parts management.   Gait Training: PT instructs pt in ambulation with RW x 105' req min A and verbal/tactile cues for weight shifting R/L, as well as upright posture and safe walker management.    Therapeutic Exercise: PT instructs pt in mat level exercises: bridging, marching from B hook lie, hip ER with orange theraband x 10 reps each. Pt req verbal cues to breath while working out.   Pt fatigues rapidly, but is very motivated to improve function. Pt continues to have significant B LE weakness, especially gluteals, but demonstrates a remarkable ability to compensate with strong B UEs. Pt's spinal stenosis continues to cause him pain - PT introduces rest breaks with bed/mat mobility and instructs pt in seated lumbar flexion stretch in an attempt to open spinal canal to reduce pain - pt demonstrates understanding. PT also instructs pt in w/c push-up to be held 15-20 seconds for bottom pressure relief due to bottom sore being present; pt demonstrates understanding. Pt will benefit from continued B LE strengthening, transfer training with a focus on safe sit to stand, progressive gait training, efficient w/c propulsion, standing balance reeducation, and generalized activity tolerance.    Therapy Documentation Precautions:  Precautions Precautions: Fall Precaution Comments: chronic back pain - performs bending with no B,L,T and log rolls Restrictions Weight Bearing Restrictions: No    Pain: Pain Assessment Pain Assessment: 0-10 Pain Score: 7  Pain Type: Chronic pain Pain Location: Back Pain Descriptors / Indicators: Constant Pain Onset: On-going Pain Intervention(s): Repositioned;Rest Multiple Pain Sites: No  See FIM for current functional status  Therapy/Group: Individual Therapy  Brenetta Penny M 08/02/2014,  8:39 AM

## 2014-08-02 NOTE — Progress Notes (Signed)
Occupational Therapy Session Note  Patient Details  Name: Travis [name] MRN: [id number] Date of Birth: 1946/03/31  Today's Date: 08/02/2014 Time: [id number] Time Calculation (min): 60 min and 30 min  Short Term Goals: Week 2:  OT Short Term Goal 1 (Week 2): Pt. will perform bathing , seated and standing, with Mod A  in order to increase I in self care.  OT Short Term Goal 2 (Week 2): Pt. will engage in 20 minutes of functional activity without requiring rest break in order to increase endurance for occupational performance. OT Short Term Goal 3 (Week 2): Pt. will perform LB dressing with Min A in order to increase I with ADLs.  OT Short Term Goal 4 (Week 2): Pt. will perform sit to stand from wheelchair height with Mod A in order to increase functional mobility.  OT Short Term Goal 5 (Week 2): Pt. will perform supine to sit with min A in order to  increase I  in bed mobility.  Skilled Therapeutic Interventions/Progress Updates:  Session 1: [phone] Pt with no c/o pain. Pt seated in wheelchair upon arrival to room. OT session focused on ADL retraining, dynamic standing balance, dynamic seated balance, energy conservation, and functional transfers. Pt performed squat pivot from wheelchair to tub transfer bench with verbal cues and Min A for safety. Bathing performed with supervision only while engaging in functional dynamic sitting balance activity from tub transfer bench. Lateral leans to L and R to wash buttocks with verbal cues from therapist for proper technique. Grooming performed at sink side independently with increased time. Verbal cues provided for encouragement of crossover technique for LB dressing. Sit to stand from wheelchair height with RW and Mod A in order to perform clothing management. Encouragement for pursed lip breathing especially towards end of session as pt began to increase fatigue. Pt making progress towards goals.   Session 2: 1430-1500 Pt with 3/10 c/o  pain in lower back described as an ache. Repositioned and rest after session and pt reported decreased to 2/10 pain. Pt founded seated in Financial trader. Session addressed: sit <> stands, functional transfers, and ADL retraining. Sit to stand with use of RW with Mod A from recliner chair.Pt ambulated ~ 15 feet into bathroom with Min A for balance. Toilet transfer and toileting with steady assist for transfer and clothing management. Front of shorts and underwear wet from previous use of urinal per pt report. Pt engaged in lateral leans while seated in wheelchair to remove soiled clothing. OT facilitated crossover technique as previously educated this morning for donning clean underwear and shorts. Sit to stand Mod A from wheelchair. Pt require Min A for balance during clothing management.   Therapy Documentation Precautions:  Precautions Precautions: Fall Precaution Comments: chronic back pain - performs bending with no B,L,T and log rolls Restrictions Weight Bearing Restrictions: No ADL: ADL ADL Comments: see FIM  See FIM for current functional status  Therapy/Group: Individual Therapy  [name] 08/02/2014, 3:34 PM

## 2014-08-02 NOTE — Progress Notes (Signed)
[place] PHYSICAL MEDICINE & REHABILITATION     PROGRESS NOTE    Subjective/Complaints: Continues to show progress. Better safety awareness. Up with therapy already this am.  Objective: Vital Signs: Blood pressure 145/72, pulse 59, temperature 97.9 F (36.6 C), temperature source Oral, resp. rate 19, height 6\' 1"  (1.854 m), weight 117.935 kg (260 lb), SpO2 94.00%. No results found. No results found for this basename: WBC, HGB, HCT, PLT,  in the last 72 hours No results found for this basename: NA, K, CL, CO, GLUCOSE, BUN, CREATININE, CALCIUM,  in the last 72 hours CBG (last 3)   Recent Labs  08/01/14 1635 08/01/14 2139 08/02/14 0650  GLUCAP 122* 121* 161*    Wt Readings from Last 3 Encounters:  08/02/14 117.935 kg (260 lb)  07/18/14 119.8 kg (264 lb 1.8 oz)  03/13/14 131.09 kg (289 lb)    Physical Exam:  Constitutional: He is oriented to person, place, and time. He appears well-developed and well-nourished. Side lying in bed  HENT: dentition fair. Oral mucosa pink/moist  Head: Normocephalic and atraumatic.  Eyes: Conjunctivae are normal. Pupils are equal, round, and reactive to light.  Neck: Normal range of motion. Neck supple.  Cardiovascular: Normal rate and regular rhythm. No murmurs  Respiratory: Effort normal and breath sounds normal. No respiratory distress. He has wheezes in upper airway.  GI: Soft. Bowel sounds are normal. He exhibits no distension. There is no tenderness.  Musculoskeletal: minimal edema in both legs.. Has low back pain with minimal lower extremity movement and with bed mobility Neurological: He is alert and oriented to person, place, and time. Appears rested    Able to follow basic commands without difficulty. Dysesthesias better bilateral feet. CN exam normal.  extremity: Intrinsic atrophy bilateral hands.  Motor strength is 4 minus bilateral grip 5 bilateral biceps and deltoid, 4/5 left triceps 4/5 right tricep, wrist extension 4+  2 minus  hip flexion knee extension ankle dorsiflexion and plantarflexion  Sensation intact to light touch in bilateral upper and lower extremities except for decreased proprioception in bilateral feet.  Deep tendon reflexes 2+ bilateral knees trace bilateral biceps and brachioradialis and triceps, 1+ patella,achilles  Ankles without clonus  Skin: chronic changes on feet/ dry, scaly skin, small reddened area left buttock,  stage 2 right buttock. Back surgery site intact  Psych: pleasant, appropriate. Improved insight  Assessment/Plan: 1. Functional deficits secondary to deconditioning, hx of cervical myelopathy which require 3+ hours per day of interdisciplinary therapy in a comprehensive inpatient rehab setting. Physiatrist is providing close team supervision and 24 hour management of active medical problems listed below. Physiatrist and rehab team continue to assess barriers to discharge/monitor patient progress toward functional and medical goals.    FIM: FIM - Bathing Bathing Steps Patient Completed: Chest;Right Arm;Left Arm;Abdomen;Front perineal area;Buttocks;Right upper leg;Left upper leg;Right lower leg (including foot);Left lower leg (including foot) Bathing: 5: Supervision: Safety issues/verbal cues  FIM - Upper Body Dressing/Undressing Upper body dressing/undressing steps patient completed: Thread/unthread right sleeve of pullover shirt/dresss;Thread/unthread left sleeve of pullover shirt/dress;Put head through opening of pull over shirt/dress;Pull shirt over trunk Upper body dressing/undressing: 5: Set-up assist to: Obtain clothing/put away FIM - Lower Body Dressing/Undressing Lower body dressing/undressing steps patient completed: Don/Doff right shoe;Don/Doff left shoe;Pull underwear up/down;Pull pants up/down Lower body dressing/undressing: 3: Mod-Patient completed 50-74% of tasks  FIM - Toileting Toileting steps completed by patient: Adjust clothing prior to toileting;Performs  perineal hygiene;Adjust clothing after toileting Toileting Assistive Devices: Grab bar or rail for support Toileting:  4: Steadying assist  FIM - Radio producer Devices: Grab bars;Elevated toilet seat Toilet Transfers: 5-To toilet/BSC: Supervision (verbal cues/safety issues);5-From toilet/BSC: Supervision (verbal cues/safety issues)  FIM - Control and instrumentation engineer Devices: Copy: 4: Bed > Chair or W/C: Min A (steadying Pt. > 75%);3: Chair or W/C > Bed: Mod A (lift or lower assist)  FIM - Locomotion: Wheelchair Distance: 2000 Locomotion: Wheelchair: 6: Travels 150 ft or more, turns around, maneuvers to table, bed or toilet, negotiates 3% grade: maneuvers on rugs and over door sills independently FIM - Locomotion: Ambulation Locomotion: Ambulation Assistive Devices: Administrator Ambulation/Gait Assistance: 4: Min assist Locomotion: Ambulation: 2: Travels 50 - 149 ft with minimal assistance (Pt.>75%)  Comprehension Comprehension Mode: Auditory Comprehension: 5-Understands complex 90% of the time/Cues < 10% of the time  Expression Expression Mode: Verbal Expression: 5-Expresses complex 90% of the time/cues < 10% of the time  Social Interaction Social Interaction: 5-Interacts appropriately 90% of the time - Needs monitoring or encouragement for participation or interaction.  Problem Solving Problem Solving: 5-Solves basic 90% of the time/requires cueing < 10% of the time  Memory Memory Mode: Not assessed Memory: 5-Recognizes or recalls 90% of the time/requires cueing < 10% of the time  Medical Problem List and Plan:  1. Functional deficits secondary to Cervical myelopathy with deconditioning after pneumonia requiring intubation  2. DVT Prophylaxis/Anticoagulation: Pharmaceutical: Lovenox  3. Chronic Pain Management: Off Volatren and NSAIDS.   -oxycontin--dc as pt doesn't want any longer (pain actually much  improved)  -baclofen for cramps/spasms  -maintain gabapentin for neuropathic pain   -improved overall  4. Mood: Monitor as mentation improves. Will have LCSW follow for evaluation and support.  5. Neuropsych: This patient is capable of making decisions on his own behalf.  6. 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 HTN: Continue metoprolol and lasix. Cards recommendations: if needed to add norvasc and resume ACE if renal status stable.   -added 5mg  norvasc with improvement 9. Acute on chronic diastolic CHF: Daily weights. Low salt diet. Patient non compliant at home--will have RD educate on appropriate diet.  10. Hypotension with AKI: BUN holding around 50 11. BPH: foley d/c'ed Off cardura and ditropan at this time. Monitor voiding.  12. DM type 2 diet controlled:  Hgb A1c-7.0. BS poorly controlled due to stress/infection requiring lantus. Will monitor BS with ac/hs checks and start patient on oral agent. amaryl now at 4mg .   -dc'ed lantus   13. Insomnia with RLS: Continue Mirapex. Sleep overall improved with better bed    14. Constipation:  bowel program.  LOS (Days) 15 A FACE TO FACE EVALUATION WAS PERFORMED  Leshonda Galambos T 08/02/2014 9:19 AM

## 2014-08-02 NOTE — Progress Notes (Signed)
Physical Therapy Weekly Progress Note  Patient Details  Name: Travis [name] MRN: [id number] Date of Birth: Dec 19, 1946  Beginning of progress report period: July 26, 2014 End of progress report period: August 02, 2014  Today's Date: 08/02/2014 Time: 8850-2774 Time Calculation (min): 31 min  Patient has made good progress and has met 4 of 5 short term goals.  Pt is currently supervision-min A for bed mobility on flat bed, min-mod A for bed <> w/c transfers stand pivot with RW, mod I for w/c mobility in controlled environment, min A for gait household distances and mod-max A for stair negotiation with one rail on R.    Patient continues to demonstrate the following deficits: impaired activity tolerance/endurance, impaired LE strength and functional endurance, impaired postural control, balance, gait and therefore will continue to benefit from skilled PT intervention to enhance overall performance with activity tolerance, balance, postural control and functional use of  right lower extremity and left lower extremity.  Patient progressing toward long term goals..  Continue plan of care.  PT Short Term Goals Week 2:  PT Short Term Goal 1 (Week 2): Pt to perform bed mobility with min Ax1person PT Short Term Goal 1 - Progress (Week 2): Met PT Short Term Goal 2 (Week 2): Pt to perform bed<>w/c transfers with max Ax1person PT Short Term Goal 2 - Progress (Week 2): Met PT Short Term Goal 3 (Week 2): Pt will demonstrate w/c propulsion x150' at mod(I) level PT Short Term Goal 3 - Progress (Week 2): Met PT Short Term Goal 4 (Week 2): Pt will ambulate 50' w/ RW and mod Ax1person PT Short Term Goal 4 - Progress (Week 2): Met PT Short Term Goal 5 (Week 2): Pt will negotiation up/down 2 steps + single rail with mod A x1person PT Short Term Goal 5 - Progress (Week 2): Progressing toward goal Week 3:  PT Short Term Goal 1 (Week 3): = LTG   Skilled Therapeutic Interventions/Progress Updates:   Pt received  in w/c; reports performing bed mobility and gait with PT earlier and car transfer two days ago.  Pt agreeable to perform stair negotiation training.  Discussed with pt alternate sequence for stair negotiation: laterally with bilat UE support on R rail.  Pt performed up/down 4 stairs with R rail laterally leading with LLE to ascend with mod-max A with assistance required to clear L foot to next step and to assist with pushing up and advancing COG to next step; min A to descend.  Discussed with pt the use of off the shelf AFO (Reaction brace secondary to ALLARD and BLUE ROCKER not appropriate due to pt weight) for DF assist and knee stance control on LLE.  Donned L medium Reaction AFO and performed stair negotiation again up/down 4 stairs with R rail with max A and pt still requiring increased assistance for L foot clearance and knee extension in stance. Pt felt that the brace caused his knee to buckle more.  Discussed that the buckling may have come from fatigue and possibly needing Large or XL Reaction brace for more appropriate fit and knee control.  Will continue to assess.  Returned to room and pt set up with all items within reach.  Therapy Documentation Precautions:  Precautions Precautions: Fall Precaution Comments: chronic back pain - performs bending with no B,L,T and log rolls Restrictions Weight Bearing Restrictions: No Pain: Pain Assessment Pain Assessment: 0-10 Pain Score: 7  Pain Type: Chronic pain Pain Location: Back Pain Descriptors / Indicators:  Constant Pain Onset: On-going Pain Intervention(s): Repositioned;Rest Multiple Pain Sites: No  See FIM for current functional status  Therapy/Group: Individual Therapy  [name] 08/02/2014, 12:16 PM

## 2014-08-03 ENCOUNTER — Inpatient Hospital Stay (HOSPITAL_COMMUNITY): Payer: Medicare Other

## 2014-08-03 ENCOUNTER — Inpatient Hospital Stay (HOSPITAL_COMMUNITY): Payer: Medicare Other | Admitting: Occupational Therapy

## 2014-08-03 LAB — GLUCOSE, CAPILLARY
GLUCOSE-CAPILLARY: 111 mg/dL — AB (ref 70–99)
Glucose-Capillary: 152 mg/dL — ABNORMAL HIGH (ref 70–99)
Glucose-Capillary: 134 mg/dL — ABNORMAL HIGH (ref 70–99)
Glucose-Capillary: 133 mg/dL — ABNORMAL HIGH (ref 70–99)

## 2014-08-03 NOTE — Progress Notes (Signed)
Pt has home CPAP and self administers when ready.  Pt had RT fill humidity chamber and turn machine on.  RT to monitor and assess as needed.

## 2014-08-03 NOTE — Progress Notes (Signed)
Physical Therapy Session Note  Patient Details  Name: Travis [name] MRN: [id number] Date of Birth: 1946/12/05  Today's Date: 08/03/2014 Time: 0900-1000 & [id number] Time Calculation (min): 60 min & 30 min  Short Term Goals: Week 1:  PT Short Term Goal 1 (Week 1): Pt will demonstrate log rolling in bed with rail req min A.  PT Short Term Goal 1 - Progress (Week 1): Met PT Short Term Goal 2 (Week 1): Pt will demonstrate side lie to sit transfer with rail req mod A.  PT Short Term Goal 2 - Progress (Week 1): Met PT Short Term Goal 3 (Week 1): Pt will demonstrate sit to stand with RW req max A.  PT Short Term Goal 3 - Progress (Week 1): Progressing toward goal PT Short Term Goal 4 (Week 1): Pt will demonstrate w/c propulsion x 100' req SBA.  PT Short Term Goal 4 - Progress (Week 1): Met PT Short Term Goal 5 (Week 1): Pt will demonstrate ambulation in // bars x 10' req mod A.  PT Short Term Goal 5 - Progress (Week 1): Met Week 2:  PT Short Term Goal 1 (Week 2): Pt to perform bed mobility with min Ax1person PT Short Term Goal 1 - Progress (Week 2): Met PT Short Term Goal 2 (Week 2): Pt to perform bed<>w/c transfers with max Ax1person PT Short Term Goal 2 - Progress (Week 2): Met PT Short Term Goal 3 (Week 2): Pt will demonstrate w/c propulsion x150' at mod(I) level PT Short Term Goal 3 - Progress (Week 2): Met PT Short Term Goal 4 (Week 2): Pt will ambulate 50' w/ RW and mod Ax1person PT Short Term Goal 4 - Progress (Week 2): Met PT Short Term Goal 5 (Week 2): Pt will negotiation up/down 2 steps + single rail with mod A x1person PT Short Term Goal 5 - Progress (Week 2): Progressing toward goal  Skilled Therapeutic Interventions/Progress Updates:  AM Session (60 min): Pt. On toilet prior to session beginning; pt indicates "he's done." Pt Agreeable to therapy session. Functional training for sit<>stand with pericare; toilet>w/c. Donned new shorts/brief with RN tech assistance with pt. In  sara steady.  1:1 (2:1 mid session with Recreational Therapist present) Treatment focused on w/c mgmt, functional transfers, and self-care. Pt. Assist level is min to mod A overall with 1:1 with RW. Pt. Benefits from +2 for advancing and performing tasks with increased difficulty. Treatment consisted of  Toilet transfer with grab rail and therapist at mod - max A due to positioning, additional pericare, one arm steady and clothing mgmt. Pt. Performed .w/c propulsion >150 feet with occasional verbal cue to "watch where you are going" with drifting left. Pt. (S) with wheelchair management, but requires min A with increased activity related fatigue. Pt. Focus with transfer sit<>Stand x 8 with explaination, demonstration, illustration and visualization techniques prior to performing without RW for support. Pt. Demonstrated improved weight shift, proprioceptive positioning, and balance control for min-A from therapist to stand. Pt. Required verbal and tactile cues to maintain balance and postural alignment. As patient fatigued +2A provided by recreational therapist.  Pt. Response was enthusiastic and encouraged by morning session  Pain: soreness present in Bilateral Quadricepts area, but no c/o pain post session  Pt. Condition post therapy session: positioned seated in w/c without quick release belt and all needs within reach.    PM Session (30 min): Pt. Received seated in w/c in hallway waiting on therapy session to begin; pt has great enthusiasm  for therapy and increasing his performance/abilities to functional levels with safety awareness. Student shadowing with therapist during session; pt. Agrees to shadow.  1:1 Treatment focused on sit<>stand, static and dynamic balance without BUE support, weight shifting and increased ankle strategy strengthening Pt. Assist level is min to mod A with therapist and armrest support. Pt. Performed x3 sit<>Stand with therapist assist only at min-mod A; with RW at min  A and steady from therapist. In static and dynamic standing for prolonged time >2 minutes ea using ankle strategy for static balance into dynamic balance with weight shifting anterior/posterior, incorporated hip strategy in lateral movements with and without BUE support. Pt. Presents with posterior lean and LOB without steady. Pt. Fatigued quickly and required one 5 minute therapeutic rest recovery break during session. Pt. Ended session satisfied, but wanting to do more; enthusiastic with performance increases and really enjoys participating. Pt. Verbalized wanting to continue therapy for additional gains post-inpatient stay.  Pain: no c/o pain.  Pt. Condition post therapy session: positioned seated in w/c with all needs within reach.   Therapy Documentation Precautions:  Precautions Precautions: Fall Precaution Comments: chronic back pain - performs bending with no B,L,T and log rolls Restrictions Weight Bearing Restrictions: No Locomotion : Ambulation Ambulation/Gait Assistance: 4: Min assist Wheelchair Mobility Distance: 150   See FIM for current functional status  Therapy/Group: Individual Therapy  Juluis Mire 08/03/2014, 4:12 PM

## 2014-08-03 NOTE — Progress Notes (Signed)
Occupational Therapy Session Note  Patient Details  Name: Travis [name] MRN: [id number] Date of Birth: November 23, 1946  Today's Date: 08/03/2014 Time: [id number] and [id number] Time Calculation (min): 60 min and 30 min  Short Term Goals: Week 2:  OT Short Term Goal 1 (Week 2): Pt. will perform bathing , seated and standing, with Mod A  in order to increase I in self care.  OT Short Term Goal 2 (Week 2): Pt. will engage in 20 minutes of functional activity without requiring rest break in order to increase endurance for occupational performance. OT Short Term Goal 3 (Week 2): Pt. will perform LB dressing with Min A in order to increase I with ADLs.  OT Short Term Goal 4 (Week 2): Pt. will perform sit to stand from wheelchair height with Mod A in order to increase functional mobility.  OT Short Term Goal 5 (Week 2): Pt. will perform supine to sit with min A in order to  increase I  in bed mobility.  Skilled Therapeutic Interventions/Progress Updates:  Session 1 [phone]): Pt with no c/o pain this session. Pt seated in wheelchair upon entering the room. OT intervention focused on ADL retraining, dynamic standing balance, functional transfers, and encouragement for pursed lip breathing. Pt propelled wheelchair into bathroom and set up chair in correct place for tub bench transfer. Pt performed squat pivot transfer with verbal cues and supervision from wheelchair to tub bench. Bathing performed with supervision this session. Pt stood with Mod A for clothing management from wheelchair height with use of RW. Pt able to engage in dynamic standing balance with LB dressing. Pt continues to have difficulty with threading pants and underwear and may benefit from AE such as long handled reacher.   Session 2 [phone] ): Pt with 3/10 c/o pain in lower back and described as dull ache. Pt reported decreased to 2/10 at end of session with repositioning. OT educated and demonstrated use of long handled reacher to  assist with LB dressing. Pt returned demonstration and threaded elastic waist pants and underwear with use of long handled reacher with verbal cues for proper technique. Pt also donned and doffed bilateral shoes independently with cross over technique utilized with verbal cues from therapist. Pt continues to make progress towards goals.   Therapy Documentation Precautions:  Precautions Precautions: Fall Precaution Comments: chronic back pain - performs bending with no B,L,T and log rolls Restrictions Weight Bearing Restrictions: No  ADL: ADL ADL Comments: see FIM  See FIM for current functional status  Therapy/Group: Individual Therapy  [name] 08/03/2014, 9:07 AM

## 2014-08-03 NOTE — Progress Notes (Signed)
[facility]rav[name] PHYSICAL MEDICINE & REHABILITATION     PROGRESS NOTE    Subjective/Complaints: No new issues. Feeling stronger. Sleep is fair.  Objective: Vital Signs: Blood pressure 125/65, pulse 80, temperature 97.7 F (36.5 C), temperature source Oral, resp. rate 18, height 6\' 1"  (1.854 m), weight 119.296 kg (263 lb), SpO2 94.00%. No results found. No results found for this basename: WBC, HGB, HCT, PLT,  in the last 72 hours No results found for this basename: NA, K, CL, CO, GLUCOSE, BUN, CREATININE, CALCIUM,  in the last 72 hours CBG (last 3)   Recent Labs  08/02/14 1636 08/02/14 2114 08/03/14 0747  GLUCAP 97 157* 152*    Wt Readings from Last 3 Encounters:  08/03/14 119.296 kg (263 lb)  07/18/14 119.8 kg (264 lb 1.8 oz)  03/13/14 131.09 kg (289 lb)    Physical Exam:  Constitutional: He is oriented to person, place, and time. He appears well-developed and well-nourished. Side lying in bed  HENT: dentition fair. Oral mucosa pink/moist  Head: Normocephalic and atraumatic.  Eyes: Conjunctivae are normal. Pupils are equal, round, and reactive to light.  Neck: Normal range of motion. Neck supple.  Cardiovascular: Normal rate and regular rhythm. No murmurs  Respiratory: Effort normal and breath sounds normal. No respiratory distress. He has wheezes in upper airway.  GI: Soft. Bowel sounds are normal. He exhibits no distension. There is no tenderness.  Musculoskeletal: minimal edema in both legs.. Has low back pain with minimal lower extremity movement and with bed mobility Neurological: He is alert and oriented to person, place, and time. Appears rested    Able to follow basic commands without difficulty. Dysesthesias better bilateral feet. CN exam normal.  extremity: Intrinsic atrophy bilateral hands.  Motor strength is 4 minus bilateral grip 5 bilateral biceps and deltoid, 4/5 left triceps 4/5 right tricep, wrist extension 4+  2 minus hip flexion knee extension ankle  dorsiflexion and plantarflexion  Sensation intact to light touch in bilateral upper and lower extremities except for decreased proprioception in bilateral feet.  Deep tendon reflexes 2+ bilateral knees trace bilateral biceps and brachioradialis and triceps, 1+ patella,achilles  Ankles without clonus  Skin: chronic changes on feet/ dry, scaly skin, small reddened area left buttock,  stage 2 right buttock. Back surgery site intact  Psych: pleasant, appropriate. Improved insight  Assessment/Plan: 1. Functional deficits secondary to deconditioning, hx of cervical myelopathy which require 3+ hours per day of interdisciplinary therapy in a comprehensive inpatient rehab setting. Physiatrist is providing close team supervision and 24 hour management of active medical problems listed below. Physiatrist and rehab team continue to assess barriers to discharge/monitor patient progress toward functional and medical goals.  Continued gradual progress. Family ed needed.   FIM: FIM - Bathing Bathing Steps Patient Completed: Chest;Right Arm;Left Arm;Abdomen;Left upper leg;Right upper leg;Buttocks;Front perineal area;Right lower leg (including foot);Left lower leg (including foot) Bathing: 5: Supervision: Safety issues/verbal cues  FIM - Upper Body Dressing/Undressing Upper body dressing/undressing steps patient completed: Thread/unthread right sleeve of pullover shirt/dresss;Thread/unthread left sleeve of pullover shirt/dress;Put head through opening of pull over shirt/dress;Pull shirt over trunk Upper body dressing/undressing: 6: More than reasonable amount of time FIM - Lower Body Dressing/Undressing Lower body dressing/undressing steps patient completed: Thread/unthread left underwear leg;Pull underwear up/down;Thread/unthread left pants leg;Pull pants up/down;Don/Doff right shoe;Don/Doff left shoe Lower body dressing/undressing: 3: Mod-Patient completed 50-74% of tasks  FIM - Toileting Toileting steps  completed by patient: Adjust clothing prior to toileting;Performs perineal hygiene;Adjust clothing after toileting Toileting Assistive Devices:  Grab bar or rail for support Toileting: 4: Steadying assist  FIM - Radio producer Devices: Elevated toilet seat;Grab bars Toilet Transfers: 4-To toilet/BSC: Min A (steadying Pt. > 75%)  FIM - Bed/Chair Transfer Bed/Chair Transfer Assistive Devices: Arm rests;Bed rails;Walker Bed/Chair Transfer: 5: Supine > Sit: Supervision (verbal cues/safety issues);5: Sit > Supine: Supervision (verbal cues/safety issues);4: Bed > Chair or W/C: Min A (steadying Pt. > 75%);4: Chair or W/C > Bed: Min A (steadying Pt. > 75%)  FIM - Locomotion: Wheelchair Distance: 2000 Locomotion: Wheelchair: 5: Travels 150 ft or more: maneuvers on rugs and over door sills with supervision, cueing or coaxing FIM - Locomotion: Ambulation Locomotion: Ambulation Assistive Devices: Administrator Ambulation/Gait Assistance: 4: Min assist Locomotion: Ambulation: 2: Travels 50 - 149 ft with minimal assistance (Pt.>75%)  Comprehension Comprehension Mode: Auditory Comprehension: 5-Understands complex 90% of the time/Cues < 10% of the time  Expression Expression Mode: Verbal Expression: 5-Expresses complex 90% of the time/cues < 10% of the time  Social Interaction Social Interaction: 5-Interacts appropriately 90% of the time - Needs monitoring or encouragement for participation or interaction.  Problem Solving Problem Solving: 4-Solves basic 75 - 89% of the time/requires cueing 10 - 24% of the time  Memory Memory Mode: Not assessed Memory: 5-Recognizes or recalls 90% of the time/requires cueing < 10% of the time  Medical Problem List and Plan:  1. Functional deficits secondary to Cervical myelopathy with deconditioning after pneumonia requiring intubation  2. DVT Prophylaxis/Anticoagulation: Pharmaceutical: Lovenox  3. Chronic Pain Management: Off  Volatren and NSAIDS.   -oxycontin--dc as pt doesn't want any longer (pain actually much improved)  -baclofen for cramps/spasms  -maintain gabapentin for neuropathic pain   -improved overall  4. Mood: Monitor as mentation improves. Will have LCSW follow for evaluation and support.  5. Neuropsych: This patient is capable of making decisions on his own behalf.  6. 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 HTN: Continue metoprolol and lasix. Cards recommendations: if needed to add norvasc and resume ACE if renal status stable.   -added 5mg  norvasc with improvement 9. Acute on chronic diastolic CHF: Daily weights. Low salt diet. Patient non compliant at home--will have RD educate on appropriate diet.  10. Hypotension with AKI: BUN holding around 50 11. BPH: foley d/c'ed Off cardura and ditropan at this time. Voiding fairly well 12. DM type 2 diet controlled:  Hgb A1c-7.0. BS poorly controlled due to stress/infection requiring lantus. Will monitor BS with ac/hs checks and start patient on oral agent. amaryl now at 4mg .   -off insulin  -sugars acceptable 13. Insomnia with RLS: Continue Mirapex. Sleep overall improved with better bed    14. Constipation:  bowel program.  LOS (Days) 16 A FACE TO FACE EVALUATION WAS PERFORMED  SWARTZ,ZACHARY T 08/03/2014 9:07 AM

## 2014-08-04 ENCOUNTER — Inpatient Hospital Stay (HOSPITAL_COMMUNITY): Payer: Medicare Other

## 2014-08-04 ENCOUNTER — Inpatient Hospital Stay (HOSPITAL_COMMUNITY): Payer: Medicare Other | Admitting: Occupational Therapy

## 2014-08-04 LAB — GLUCOSE, CAPILLARY
GLUCOSE-CAPILLARY: 99 mg/dL (ref 70–99)
Glucose-Capillary: 161 mg/dL — ABNORMAL HIGH (ref 70–99)
Glucose-Capillary: 141 mg/dL — ABNORMAL HIGH (ref 70–99)
Glucose-Capillary: 104 mg/dL — ABNORMAL HIGH (ref 70–99)
Glucose-Capillary: 77 mg/dL (ref 70–99)

## 2014-08-04 MED ORDER — FUROSEMIDE 40 MG PO TABS
40.0000 mg | ORAL_TABLET | Freq: Every day | ORAL | Status: DC
  Administered 2014-08-05 – 2014-08-09 (×5): 40 mg via ORAL
  Filled 2014-08-04 (×7): qty 1

## 2014-08-04 MED ORDER — FUROSEMIDE 20 MG PO TABS
20.0000 mg | ORAL_TABLET | Freq: Every day | ORAL | Status: DC
  Administered 2014-08-04: 20 mg via ORAL
  Filled 2014-08-04 (×4): qty 1

## 2014-08-04 MED ORDER — OXYBUTYNIN CHLORIDE 5 MG PO TABS
2.5000 mg | ORAL_TABLET | Freq: Two times a day (BID) | ORAL | Status: DC
  Administered 2014-08-04: 2.5 mg via ORAL
  Administered 2014-08-05 – 2014-08-06 (×3): via ORAL
  Administered 2014-08-06 – 2014-08-09 (×6): 2.5 mg via ORAL
  Filled 2014-08-04 (×12): qty 0.5

## 2014-08-04 MED ORDER — ESCITALOPRAM OXALATE 5 MG PO TABS
5.0000 mg | ORAL_TABLET | Freq: Every day | ORAL | Status: DC
  Administered 2014-08-04 – 2014-08-08 (×5): 5 mg via ORAL
  Filled 2014-08-04 (×6): qty 1

## 2014-08-04 NOTE — Progress Notes (Signed)
Occupational Therapy Weekly Progress Note  Patient Details  Name: Travis [name] MRN: [id number] Date of Birth: 10/19/1946  Beginning of progress report period: July 28, 2014 End of progress report period: August 04, 2014  Today's Date: 08/04/2014 Time: 6599-3570 and 1130-1200 Time Calculation (min): 60 min and 30  Patient has met 4 of 5 short term goals.  Patient is making progress towards LB dressing.  Patient continues to demonstrate the following deficits: decreased strength in BLEs, decreased endurance, decreased functional mobility, decreased I in ADLs,  And decreased dynamic standing balance and therefore will continue to benefit from skilled OT intervention to enhance overall performance with BADL.  Patient progressing toward long term goals..  Continue plan of care.  OT Short Term Goals Week 2:  OT Short Term Goal 1 (Week 2): Pt. will perform bathing , seated and standing, with Mod A  in order to increase I in self care.  OT Short Term Goal 1 - Progress (Week 2): Met OT Short Term Goal 2 (Week 2): Pt. will engage in 20 minutes of functional activity without requiring rest break in order to increase endurance for occupational performance. OT Short Term Goal 2 - Progress (Week 2): Met OT Short Term Goal 3 (Week 2): Pt. will perform LB dressing with Min A in order to increase I with ADLs.  OT Short Term Goal 3 - Progress (Week 2): Partly met OT Short Term Goal 4 (Week 2): Pt. will perform sit to stand from wheelchair height with Mod A in order to increase functional mobility.  OT Short Term Goal 4 - Progress (Week 2): Met OT Short Term Goal 5 (Week 2): Pt. will perform supine to sit with min A in order to  increase I  in bed mobility. OT Short Term Goal 5 - Progress (Week 2): Met Week 3:  OT Short Term Goal 1 (Week 3): short term goals = long term goals secondary to short length of stay remaining  Skilled Therapeutic Interventions/Progress Updates:    Session 1 (1779- 3903): Pt  with no c/o pain this session. Upon entering the room, pt seated in wheelchair awaiting therapist arrival. Pt propelled self to bathroom in order to set wheelchair up in proper placement for transfer to tub bench. OT session focused on ADL retraining, dynamic standing balance, functional transfers, and pursed lip breathing. Pt required encouragement towards end of session for pursed lip breathing secondary to increased fatigue. Pt performed squat pivot transfer to tub transfer bench from wheelchair with supervision and verbal cues for proper placement. Bathing performed with supervision this session. Grooming performed seated in wheelchair at sink with increased time required for activities. Pt engaged in dynamic standing balance with LB dressing this session with RW utilized when standing to pull up pants. Pt required Mod A for sit to stand from wheelchair height.   Session 2(1130-1200):Pt with no c/o pain this session. Wife present in room and patient continues to sit in wheelchair. Pt propels self in wheelchair with BUEs ~ 200 feet. Pt performing sit <> stand x 3 reps with steady assist and use of RW and verbal cues for "nose over toes" and forward sitting. Wife reports this session that pt will need to be able to walk into and out of bathroom for bathing and toileting. Pt ambulated house hold distance of 2 bouts of 20' with steady assist. Pt also demonstrated ability to turn and go through doors to the left and right. Pt required verbal cues to remain precautions  as pt observed to be twisting. Pt making progress towards goals.  Therapy Documentation Precautions:  Precautions Precautions: Fall Precaution Comments: chronic back pain - performs bending with no B,L,T and log rolls Restrictions Weight Bearing Restrictions: No ADL: ADL ADL Comments: see FIM  See FIM for current functional status  Therapy/Group: Individual Therapy  [name] 08/04/2014, 11:20 AM

## 2014-08-04 NOTE — Progress Notes (Signed)
Physical Therapy Session Note  Patient Details  Name: Travis [name] MRN: [id number] Date of Birth: 1946-11-25  Today's Date: 08/04/2014 Time: 5916-3846 Time Calculation (min): 64 min  Short Term Goals: Week 1:  PT Short Term Goal 1 (Week 1): Pt will demonstrate log rolling in bed with rail req min A.  PT Short Term Goal 1 - Progress (Week 1): Met PT Short Term Goal 2 (Week 1): Pt will demonstrate side lie to sit transfer with rail req mod A.  PT Short Term Goal 2 - Progress (Week 1): Met PT Short Term Goal 3 (Week 1): Pt will demonstrate sit to stand with RW req max A.  PT Short Term Goal 3 - Progress (Week 1): Progressing toward goal PT Short Term Goal 4 (Week 1): Pt will demonstrate w/c propulsion x 100' req SBA.  PT Short Term Goal 4 - Progress (Week 1): Met PT Short Term Goal 5 (Week 1): Pt will demonstrate ambulation in // bars x 10' req mod A.  PT Short Term Goal 5 - Progress (Week 1): Met Week 2:  PT Short Term Goal 1 (Week 2): Pt to perform bed mobility with min Ax1person PT Short Term Goal 1 - Progress (Week 2): Met PT Short Term Goal 2 (Week 2): Pt to perform bed<>w/c transfers with max Ax1person PT Short Term Goal 2 - Progress (Week 2): Met PT Short Term Goal 3 (Week 2): Pt will demonstrate w/c propulsion x150' at mod(I) level PT Short Term Goal 3 - Progress (Week 2): Met PT Short Term Goal 4 (Week 2): Pt will ambulate 50' w/ RW and mod Ax1person PT Short Term Goal 4 - Progress (Week 2): Met PT Short Term Goal 5 (Week 2): Pt will negotiation up/down 2 steps + single rail with mod A x1person PT Short Term Goal 5 - Progress (Week 2): Progressing toward goal Week 3:  PT Short Term Goal 1 (Week 3): = LTG   Skilled Therapeutic Interventions/Progress Updates:  Pt. Received seated in W/C and ready for therapy. Son Randall Hiss) present and available for family education.  1:1 Treatment focused on transfers: car, sit<>stand with RW, w/c<>bed squat-pivot and stand-pivot. Pt. Assist  level is min A with with transfers. Treatment consisted of functional transfers from multiple seat heights with and without RW. Pt. Performed forward stepping on 2", 4", and 6" surfaces alternating LE/Feet. Pt. Able to perform step-ups fully onto 2" and 4" platform with x10 repetitions for increased STE ability. OT to trial 4 STE progressing from 4" to 6" in followup session today. W/C mgmt skills and propulsion reenforced and mild modifications indicated for transfer set-up. Son provided min guard assist to min A for transfers during teach-back portion of session. Son indicated improved function of patient and feels very comfortable with handling safety needs of patient during transfers. Pt. Response is enthusiastic and motivated.  Pain: see below  Pt. Condition post therapy session: positioned seated seated on toilet with sara Steady positioned for safety. Pt. Coached to use call bell to call staff when finished.   Therapy Documentation Precautions:  Precautions Precautions: Fall Precaution Comments: chronic back pain - performs bending with no B,L,T and log rolls Restrictions Weight Bearing Restrictions: No  Pain: Pain Assessment Pain Assessment: No/denies pain Pain Score: 0-No pain  Locomotion : Ambulation Ambulation/Gait Assistance: 4: Min assist Wheelchair Mobility Distance: > 300 feet    See FIM for current functional status  Therapy/Group: Individual Therapy  Juluis Mire 08/04/2014, 12:29 PM

## 2014-08-04 NOTE — Progress Notes (Signed)
Occupational Therapy Session Note  Patient Details  Name: Travis [name] MRN: [id number] Date of Birth: 11/12/1946  Today's Date: 08/04/2014 Time: [id number] Time Calculation (min): 26 min  Short Term Goals: Week 3:  OT Short Term Goal 1 (Week 3): short term goals = long term goals secondary to short length of stay remaining  Skilled Therapeutic Interventions/Progress Updates:  Session 3 for 08/04/14: Pt with no c/o pain during session. Patients wife,caregiver, present this session. Pt propelled self with B UEs to gym and back at end of session ~ 350 feet. Pt ambulated up and back down 4 steps with bilateral hand rails. Step height increasing from 4 steps of 4 inches,to 5 inches, and to 6 inches respectively. Pt required Min A to steady and verbal cues for "up with the good and down with the bad." Pt appearing to be extremely fatigued at the end of session and requiring rest breaks of less than 2 minutes after each set. Pt required verbal cues for pursed lip breathing as pt began to hold breath during activity with increased fatigue.  Therapy Documentation Precautions:  Precautions Precautions: Fall Precaution Comments: chronic back pain - performs bending with no B,L,T and log rolls Restrictions Weight Bearing Restrictions: No  See FIM for current functional status  Therapy/Group: Individual Therapy  [name] 08/04/2014, 2:41 PM

## 2014-08-04 NOTE — Progress Notes (Signed)
Pt placed on CPAP with home equipment.

## 2014-08-04 NOTE — Progress Notes (Signed)
[facility] [facility] PHYSICAL MEDICINE & REHABILITATION     PROGRESS NOTE    Subjective/Complaints: Remains pleased with progress. Right knee still feels weak  Objective: Vital Signs: Blood pressure 158/71, pulse 61, temperature 97.8 F (36.6 C), temperature source Oral, resp. rate 19, height 6\' 1"  (1.854 m), weight 113 kg (249 lb 1.9 oz), SpO2 95.00%. No results found. No results found for this basename: WBC, HGB, HCT, PLT,  in the last 72 hours No results found for this basename: NA, K, CL, CO, GLUCOSE, BUN, CREATININE, CALCIUM,  in the last 72 hours CBG (last 3)   Recent Labs  08/03/14 2121 08/04/14 0641 08/04/14 0751  GLUCAP 133* 161* 141*    Wt Readings from Last 3 Encounters:  08/04/14 113 kg (249 lb 1.9 oz)  07/18/14 119.8 kg (264 lb 1.8 oz)  03/13/14 131.09 kg (289 lb)    Physical Exam:  Constitutional: He is oriented to person, place, and time. He appears well-developed and well-nourished. Side lying in bed  HENT: dentition fair. Oral mucosa pink/moist  Head: Normocephalic and atraumatic.  Eyes: Conjunctivae are normal. Pupils are equal, round, and reactive to light.  Neck: Normal range of motion. Neck supple.  Cardiovascular: Normal rate and regular rhythm. No murmurs  Respiratory: Effort normal and breath sounds normal. No respiratory distress. He has wheezes in upper airway.  GI: Soft. Bowel sounds are normal. He exhibits no distension. There is no tenderness.  Musculoskeletal: minimal edema in both legs.. Has low back pain with minimal lower extremity movement and with bed mobility Neurological: He is alert and oriented to person, place, and time. Appears rested    Able to follow basic commands without difficulty. Dysesthesias better bilateral feet. CN exam normal.  extremity: Intrinsic atrophy bilateral hands.  Motor strength is 4 minus bilateral grip 5 bilateral biceps and deltoid, 4/5 left triceps 4/5 right tricep, wrist extension 4+  3+ minus hip flexion , 3+ knee  extension, 4- ankle dorsiflexion and plantarflexion  Sensation intact to light touch in bilateral upper and lower extremities except for decreased proprioception in bilateral feet.  Deep tendon reflexes 2+ bilateral knees trace bilateral biceps and brachioradialis and triceps, 1+ patella,achilles  Ankles without clonus  Skin: chronic changes on feet/ dry, scaly skin, small reddened area left buttock,  stage 2 right buttock. Back surgery site intact  Psych: pleasant, appropriate. Improved insight  Assessment/Plan: 1. Functional deficits secondary to deconditioning, hx of cervical myelopathy which require 3+ hours per day of interdisciplinary therapy in a comprehensive inpatient rehab setting. Physiatrist is providing close team supervision and 24 hour management of active medical problems listed below. Physiatrist and rehab team continue to assess barriers to discharge/monitor patient progress toward functional and medical goals.  Reviewed short term and long term goals. He wants to be able to go out to eat and spend time with daughter after he leaves rehab. Longer term he wants to be walking.   FIM: FIM - Bathing Bathing Steps Patient Completed: Chest;Right Arm;Left Arm;Abdomen;Left upper leg;Right upper leg;Buttocks;Front perineal area;Right lower leg (including foot);Left lower leg (including foot) Bathing: 5: Supervision: Safety issues/verbal cues  FIM - Upper Body Dressing/Undressing Upper body dressing/undressing steps patient completed: Thread/unthread right sleeve of pullover shirt/dresss;Thread/unthread left sleeve of pullover shirt/dress;Put head through opening of pull over shirt/dress;Pull shirt over trunk Upper body dressing/undressing: 6: More than reasonable amount of time FIM - Lower Body Dressing/Undressing Lower body dressing/undressing steps patient completed: Thread/unthread left underwear leg;Pull underwear up/down;Thread/unthread left pants leg;Pull pants up/down;Don/Doff  right shoe;Don/Doff left shoe Lower body dressing/undressing: 3: Mod-Patient completed 50-74% of tasks  FIM - Toileting Toileting steps completed by patient: Adjust clothing prior to toileting;Performs perineal hygiene;Adjust clothing after toileting Toileting Assistive Devices: Grab bar or rail for support Toileting: 4: Steadying assist  FIM - Radio producer Devices: Elevated toilet seat;Grab bars Toilet Transfers: 4-To toilet/BSC: Min A (steadying Pt. > 75%)  FIM - Bed/Chair Transfer Bed/Chair Transfer Assistive Devices: Arm rests;Bed rails;Walker Bed/Chair Transfer: 5: Supine > Sit: Supervision (verbal cues/safety issues);5: Sit > Supine: Supervision (verbal cues/safety issues);4: Bed > Chair or W/C: Min A (steadying Pt. > 75%);4: Chair or W/C > Bed: Min A (steadying Pt. > 75%)  FIM - Locomotion: Wheelchair Distance: 150 Locomotion: Wheelchair: 5: Travels 150 ft or more: maneuvers on rugs and over door sills with supervision, cueing or coaxing FIM - Locomotion: Ambulation Locomotion: Ambulation Assistive Devices: Administrator Ambulation/Gait Assistance: 4: Min assist Locomotion: Ambulation: 2: Travels 50 - 149 ft with minimal assistance (Pt.>75%)  Comprehension Comprehension Mode: Auditory Comprehension: 5-Understands complex 90% of the time/Cues < 10% of the time  Expression Expression Mode: Verbal Expression: 5-Expresses complex 90% of the time/cues < 10% of the time  Social Interaction Social Interaction: 5-Interacts appropriately 90% of the time - Needs monitoring or encouragement for participation or interaction.  Problem Solving Problem Solving: 4-Solves basic 75 - 89% of the time/requires cueing 10 - 24% of the time  Memory Memory Mode: Not assessed Memory: 5-Recognizes or recalls 90% of the time/requires cueing < 10% of the time  Medical Problem List and Plan:  1. Functional deficits secondary to Cervical myelopathy with  deconditioning after pneumonia requiring intubation  2. DVT Prophylaxis/Anticoagulation: Pharmaceutical: Lovenox  3. Chronic Pain Management: Off Volatren and NSAIDS.   -oxycontin stopped  -baclofen for cramps/spasms  -maintain gabapentin for neuropathic pain   -improved overall  4. Mood: Monitor as mentation improves. Will have LCSW follow for evaluation and support.  5. Neuropsych: This patient is capable of making decisions on his own behalf.  6. 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 HTN: Continue metoprolol and lasix. Cards recommendations: if needed to add norvasc and resume ACE if renal status stable.   -added 5mg  norvasc with improvement 9. Acute on chronic diastolic CHF: Daily weights. Low salt diet. Patient non compliant at home--will have RD educate on appropriate diet.  10. Hypotension with AKI: BUN holding around 50 11. BPH: foley d/c'ed Off cardura and ditropan at this time. Voiding fairly well 12. DM type 2 diet controlled:  Hgb A1c-7.0. BS poorly controlled due to stress/infection requiring lantus. Will monitor BS with ac/hs checks and start patient on oral agent. amaryl now at 4mg .   -off insulin  -sugars remain acceptable 13. Insomnia with RLS: Continue Mirapex. Sleep overall improved with better bed    14. Constipation:  bowel program.  LOS (Days) 17 A FACE TO FACE EVALUATION WAS PERFORMED  SWARTZ,ZACHARY T 08/04/2014 9:19 AM

## 2014-08-05 ENCOUNTER — Inpatient Hospital Stay (HOSPITAL_COMMUNITY): Payer: Medicare Other | Admitting: *Deleted

## 2014-08-05 DIAGNOSIS — I5031 Acute diastolic (congestive) heart failure: Secondary | ICD-10-CM

## 2014-08-05 DIAGNOSIS — R609 Edema, unspecified: Secondary | ICD-10-CM

## 2014-08-05 DIAGNOSIS — J189 Pneumonia, unspecified organism: Secondary | ICD-10-CM

## 2014-08-05 DIAGNOSIS — M48061 Spinal stenosis, lumbar region without neurogenic claudication: Secondary | ICD-10-CM

## 2014-08-05 DIAGNOSIS — G4733 Obstructive sleep apnea (adult) (pediatric): Secondary | ICD-10-CM

## 2014-08-05 DIAGNOSIS — R269 Unspecified abnormalities of gait and mobility: Secondary | ICD-10-CM

## 2014-08-05 DIAGNOSIS — M4712 Other spondylosis with myelopathy, cervical region: Secondary | ICD-10-CM

## 2014-08-05 LAB — GLUCOSE, CAPILLARY
GLUCOSE-CAPILLARY: 187 mg/dL — AB (ref 70–99)
Glucose-Capillary: 145 mg/dL — ABNORMAL HIGH (ref 70–99)
Glucose-Capillary: 119 mg/dL — ABNORMAL HIGH (ref 70–99)
Glucose-Capillary: 135 mg/dL — ABNORMAL HIGH (ref 70–99)

## 2014-08-05 NOTE — Progress Notes (Signed)
YEHUDAH GROUNDS is a 68 y.o. male [birth year] [id number]  Subjective: No new complaints. No new problems. Slept well. Feeling OK.  Objective: Vital signs in last 24 hours: Temp:  [97.6 F (36.4 C)-98.3 F (36.8 C)] 98 F (36.7 C) (08/08 0456) Pulse Rate:  [56-60] 58 (08/08 0456) Resp:  [18-20] 20 (08/08 0456) BP: (140-159)/(64-75) 143/64 mmHg (08/08 0456) SpO2:  [92 %-97 %] 92 % (08/08 0456) Weight:  [257 lb 15 oz (117 kg)] 257 lb 15 oz (117 kg) (08/08 0456) Weight change: 8 lb 13.1 oz (4 kg) Last BM Date: 08/04/14  Intake/Output from previous day: 08/07 0701 - 08/08 0700 In: 1200 [P.O.:1200] Out: 1050 [Urine:1050] Last cbgs: CBG (last 3)   Recent Labs  08/04/14 1622 08/04/14 2107 08/05/14 0637  GLUCAP 77 99 145*     Physical Exam General: No apparent distress   HEENT: not dry Lungs: Normal effort. Lungs clear to auscultation, no crackles or wheezes. Cardiovascular: Regular rate and rhythm, no edema Abdomen: S/NT/ND; BS(+) Musculoskeletal:  unchanged Neurological: No new neurological deficits Wounds: [name]    Skin: clear  Aging changes Mental state: Alert, oriented, cooperative    Lab Results: BMET    Component Value Date/Time   NA 143 07/20/2014 0445   K 3.7 07/20/2014 0445   CL 96 07/20/2014 0445   CO2 32 07/20/2014 0445   GLUCOSE 190* 07/20/2014 0445   BUN 51* 07/20/2014 0445   CREATININE 1.01 07/20/2014 0445   CALCIUM 9.0 07/20/2014 0445   GFRNONAA 74* 07/20/2014 0445   GFRAA 86* 07/20/2014 0445   CBC    Component Value Date/Time   WBC 7.3 07/19/2014 0526   RBC 4.21* 07/19/2014 0526   HGB 12.7* 07/19/2014 0526   HCT 39.8 07/19/2014 0526   PLT 316 07/19/2014 0526   MCV 94.5 07/19/2014 0526   MCH 30.2 07/19/2014 0526   MCHC 31.9 07/19/2014 0526   RDW 14.9 07/19/2014 0526   LYMPHSABS 1.1 07/19/2014 0526   MONOABS 0.6 07/19/2014 0526   EOSABS 0.2 07/19/2014 0526   BASOSABS 0.1 07/19/2014 0526    Studies/Results: No results found.  Medications: I have  reviewed the patient's current medications.  Assessment/Plan:  1. Functional deficits secondary to Cervical myelopathy with deconditioning after pneumonia requiring intubation  2. DVT Prophylaxis/Anticoagulation: Pharmaceutical: Lovenox  3. Chronic Pain Management: Off Volatren and NSAIDS.  -oxycontin stopped  -baclofen for cramps/spasms  -maintain gabapentin for neuropathic pain  -improved overall  4. Mood: Monitor as mentation improves. Will have LCSW follow for evaluation and support.  5. Neuropsych: This patient is capable of making decisions on his own behalf.  6. 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 HTN: Continue metoprolol and lasix. Cards recommendations: if needed to add norvasc and resume ACE if renal status stable.  -added 5mg  norvasc with improvement  9. Acute on chronic diastolic CHF: Daily weights. Low salt diet. Patient non compliant at home--will have RD educate on appropriate diet.  10. Hypotension with AKI: BUN holding around 50  11. BPH: foley d/c'ed Off cardura and ditropan at this time. Voiding fairly well  12. DM type 2 diet controlled: Hgb A1c-7.0. BS poorly controlled due to stress/infection requiring lantus. Will monitor BS with ac/hs checks and start patient on oral agent. amaryl now at 4mg .  -off insulin  -sugars remain acceptable  13. Insomnia with RLS: Continue Mirapex. Sleep overall improved with better bed  14. Constipation: bowel program.  Cont Rx     Length of  stay, days: [place] , MD 08/05/2014, 9:29 AM

## 2014-08-05 NOTE — Progress Notes (Signed)
Occupational Therapy Session Note  Patient Details  Name: Travis [name] MRN: [id number] Date of Birth: Oct 09, 1946  Today's Date: 08/05/2014 Time: 1400-1500 Time Calculation (min): 60 min  Short Term Goals: Week 1:  OT Short Term Goal 1 (Week 1): Pt. will perform bathing , seated and standing, with Max A  in order to increase I in self care.  OT Short Term Goal 1 - Progress (Week 1): Met OT Short Term Goal 2 (Week 1): Pt. will engage in 10 minutes of functional activity without requiring rest break in order to increase endurance for occupational performance. OT Short Term Goal 2 - Progress (Week 1): Met OT Short Term Goal 3 (Week 1): Pt. will perform LB dressing with Max A in order to increase I with ADLs.  OT Short Term Goal 3 - Progress (Week 1): Met OT Short Term Goal 4 (Week 1): Pt. will perform supine to sit with Max A in order to  increase I  in bed mobility. OT Short Term Goal 4 - Progress (Week 1): Met OT Short Term Goal 5 (Week 1): Pt. will perform static sitting balance at EOB with feet supported and supervision for 2 or more minutes in order to increase balance for occupational performance.  OT Short Term Goal 5 - Progress (Week 1): Met Week 2:  OT Short Term Goal 1 (Week 2): Pt. will perform bathing , seated and standing, with Mod A  in order to increase I in self care.  OT Short Term Goal 1 - Progress (Week 2): Met OT Short Term Goal 2 (Week 2): Pt. will engage in 20 minutes of functional activity without requiring rest break in order to increase endurance for occupational performance. OT Short Term Goal 2 - Progress (Week 2): Met OT Short Term Goal 3 (Week 2): Pt. will perform LB dressing with Min A in order to increase I with ADLs.  OT Short Term Goal 3 - Progress (Week 2): Partly met OT Short Term Goal 4 (Week 2): Pt. will perform sit to stand from wheelchair height with Mod A in order to increase functional mobility.  OT Short Term Goal 4 - Progress (Week 2): Met OT Short  Term Goal 5 (Week 2): Pt. will perform supine to sit with min A in order to  increase I  in bed mobility. OT Short Term Goal 5 - Progress (Week 2): Met  Skilled Therapeutic Interventions/Progress Updates:    Pt. Engaged in functional wc mobility, sit to stand, stand pivot transfers at shower level.  Pt. Performed set up for shower with distant supervision at wc level.  Pt.performed bathing and dressing at SBA.  Pt was max assist with donning pants over hips but able to get over feet.  Performed sit to stand x3 with pants and bathing with min assist.  Pt. Very motivated to get stronger and be independent.  Left pt in wc with safety belt on and call bell,phone within reach.     Therapy Documentation Precautions:  Precautions Precautions: Fall Precaution Comments: chronic back pain - performs bending with no B,L,T and log rolls Restrictions Weight Bearing Restrictions: No       Pain:  6/10 mid back; nursing aware   ADL: ADL ADL Comments: see FIM       See FIM for current functional status  Therapy/Group: Individual Therapy  Lisa Roca 08/05/2014, 2:21 PM

## 2014-08-05 NOTE — Progress Notes (Signed)
Pt wears home cpap, Water added and RT will continue to monitor.

## 2014-08-06 ENCOUNTER — Inpatient Hospital Stay (HOSPITAL_COMMUNITY): Payer: Medicare Other | Admitting: *Deleted

## 2014-08-06 DIAGNOSIS — K5909 Other constipation: Secondary | ICD-10-CM

## 2014-08-06 LAB — GLUCOSE, CAPILLARY
GLUCOSE-CAPILLARY: 100 mg/dL — AB (ref 70–99)
GLUCOSE-CAPILLARY: 112 mg/dL — AB (ref 70–99)
Glucose-Capillary: 111 mg/dL — ABNORMAL HIGH (ref 70–99)
Glucose-Capillary: 135 mg/dL — ABNORMAL HIGH (ref 70–99)

## 2014-08-06 NOTE — Progress Notes (Signed)
Occupational Therapy Session Note  Patient Details  Name: Travis [name] MRN: [id number] Date of Birth: Jan 08, 1946  Today's Date: 08/06/2014 Time:  -    1400-1500  (60 min)    Short Term Goals: Week 1:  OT Short Term Goal 1 (Week 1): Pt. will perform bathing , seated and standing, with Max A  in order to increase I in self care.  OT Short Term Goal 1 - Progress (Week 1): Met OT Short Term Goal 2 (Week 1): Pt. will engage in 10 minutes of functional activity without requiring rest break in order to increase endurance for occupational performance. OT Short Term Goal 2 - Progress (Week 1): Met OT Short Term Goal 3 (Week 1): Pt. will perform LB dressing with Max A in order to increase I with ADLs.  OT Short Term Goal 3 - Progress (Week 1): Met OT Short Term Goal 4 (Week 1): Pt. will perform supine to sit with Max A in order to  increase I  in bed mobility. OT Short Term Goal 4 - Progress (Week 1): Met OT Short Term Goal 5 (Week 1): Pt. will perform static sitting balance at EOB with feet supported and supervision for 2 or more minutes in order to increase balance for occupational performance.  OT Short Term Goal 5 - Progress (Week 1): Met Week 2:  OT Short Term Goal 1 (Week 2): Pt. will perform bathing , seated and standing, with Mod A  in order to increase I in self care.  OT Short Term Goal 1 - Progress (Week 2): Met OT Short Term Goal 2 (Week 2): Pt. will engage in 20 minutes of functional activity without requiring rest break in order to increase endurance for occupational performance. OT Short Term Goal 2 - Progress (Week 2): Met OT Short Term Goal 3 (Week 2): Pt. will perform LB dressing with Min A in order to increase I with ADLs.  OT Short Term Goal 3 - Progress (Week 2): Partly met OT Short Term Goal 4 (Week 2): Pt. will perform sit to stand from wheelchair height with Mod A in order to increase functional mobility.  OT Short Term Goal 4 - Progress (Week 2): Met OT Short Term Goal 5  (Week 2): Pt. will perform supine to sit with min A in order to  increase I  in bed mobility. OT Short Term Goal 5 - Progress (Week 2): Met  Skilled Therapeutic Interventions/Progress Updates:    Pt.sitting in wc upon OT arrival.  Addssed bed mobility, functional mobility, transfers to toilet and shower, safety awareness, standing balance. Pt. Transferred from bed>wc>shower bench with Minimal assistance. Needed moderate assist with backing wc into bathroom to transfer to tub bench.  Transfers were much safer today with pt showing increased  Eccentric/concentric control.  Pt went from sit to stand with mod assist x3.   Completed dressing at sink as well as grooming. Left pt in wc with safety belt on and call bell,phone within reach.   Therapy Documentation Precautions:  Precautions Precautions: Fall Precaution Comments: chronic back pain - performs bending with no B,L,T and log rolls Restrictions Weight Bearing Restrictions: No       Pain:  none   ADL: ADL ADL Comments: see FIM :  See FIM for current functional status  Therapy/Group: Individual Therapy  Lisa Roca 08/06/2014, 2:50 PM

## 2014-08-06 NOTE — Progress Notes (Signed)
For sleep, last night,, gave the patient Xanax and Ultram, patient states that he had a restful night and feels better.  [employer]

## 2014-08-06 NOTE — Progress Notes (Signed)
Travis [name] is a 68 y.o. male 1946/03/25 [id number]  Subjective: No new complaints this am. No new problems. Slept well. Feeling good.  Objective: Vital signs in last 24 hours: Temp:  [97.9 F (36.6 C)] 97.9 F (36.6 C) (08/09 0543) Pulse Rate:  [54-59] 54 (08/09 0543) Resp:  [20-22] 20 (08/09 0543) BP: (119-150)/(58-69) 150/69 mmHg (08/09 0543) SpO2:  [93 %-94 %] 94 % (08/09 0543) Weight:  [262 lb 5.6 oz (119 kg)] 262 lb 5.6 oz (119 kg) (08/09 0543) Weight change: 4 lb 6.6 oz (2 kg) Last BM Date: 08/04/14  Intake/Output from previous day: 08/08 0701 - 08/09 0700 In: 1080 [P.O.:1080] Out: 800 [Urine:800] Last cbgs: CBG (last 3)   Recent Labs  08/05/14 1701 08/05/14 2038 08/06/14 0641  GLUCAP 135* 187* 100*     Physical Exam General: No apparent distress   HEENT: not dry Lungs: Normal effort. Lungs clear to auscultation, no crackles or wheezes. Cardiovascular: Regular rate and rhythm, no edema Abdomen: S/NT/ND; BS(+) Musculoskeletal:  unchanged Neurological: No new neurological deficits Wounds: [name]    Skin: clear  Aging changes Mental state: Alert, oriented, cooperative    Lab Results: BMET    Component Value Date/Time   NA 143 07/20/2014 0445   K 3.7 07/20/2014 0445   CL 96 07/20/2014 0445   CO2 32 07/20/2014 0445   GLUCOSE 190* 07/20/2014 0445   BUN 51* 07/20/2014 0445   CREATININE 1.01 07/20/2014 0445   CALCIUM 9.0 07/20/2014 0445   GFRNONAA 74* 07/20/2014 0445   GFRAA 86* 07/20/2014 0445   CBC    Component Value Date/Time   WBC 7.3 07/19/2014 0526   RBC 4.21* 07/19/2014 0526   HGB 12.7* 07/19/2014 0526   HCT 39.8 07/19/2014 0526   PLT 316 07/19/2014 0526   MCV 94.5 07/19/2014 0526   MCH 30.2 07/19/2014 0526   MCHC 31.9 07/19/2014 0526   RDW 14.9 07/19/2014 0526   LYMPHSABS 1.1 07/19/2014 0526   MONOABS 0.6 07/19/2014 0526   EOSABS 0.2 07/19/2014 0526   BASOSABS 0.1 07/19/2014 0526    Studies/Results: No results found.  Medications: I have reviewed  the patient's current medications.  Assessment/Plan:  1. Functional deficits secondary to Cervical myelopathy with deconditioning after pneumonia requiring intubation  2. DVT Prophylaxis/Anticoagulation: Pharmaceutical: Lovenox  3. Chronic Pain Management: Off Volatren and NSAIDS.  -oxycontin stopped  -baclofen for cramps/spasms  -maintain gabapentin for neuropathic pain  -improved overall  4. Mood: Monitor as mentation improves. Will have LCSW follow for evaluation and support.  5. Neuropsych: This patient is capable of making decisions on his own behalf.  6. 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 HTN: Continue metoprolol and lasix. Cards recommendations: if needed to add norvasc and resume ACE if renal status stable.  -added 5mg  norvasc with improvement  9. Acute on chronic diastolic CHF: Daily weights. Low salt diet. Patient non compliant at home--will have RD educate on appropriate diet.  10. Hypotension with AKI: BUN holding around 50  11. BPH: foley d/c'ed Off cardura and ditropan at this time. Voiding fairly well  12. DM type 2 diet controlled: Hgb A1c-7.0. BS poorly controlled due to stress/infection requiring lantus. Will monitor BS with ac/hs checks and start patient on oral agent. amaryl now at 4mg .  -off insulin  -sugars remain acceptable  13. Insomnia with RLS: Continue Mirapex. Sleep overall improved with better bed  14. Constipation: bowel program.  Cont current Rx     Length of  stay, days: [place] , MD 08/06/2014, 8:33 AM

## 2014-08-06 NOTE — Progress Notes (Signed)
Pt wearing home Cpap unit.

## 2014-08-07 ENCOUNTER — Encounter (HOSPITAL_COMMUNITY): Payer: Medicare Other | Admitting: Occupational Therapy

## 2014-08-07 ENCOUNTER — Inpatient Hospital Stay (HOSPITAL_COMMUNITY): Payer: Medicare Other

## 2014-08-07 DIAGNOSIS — J189 Pneumonia, unspecified organism: Secondary | ICD-10-CM

## 2014-08-07 DIAGNOSIS — R5381 Other malaise: Secondary | ICD-10-CM

## 2014-08-07 DIAGNOSIS — A419 Sepsis, unspecified organism: Secondary | ICD-10-CM

## 2014-08-07 DIAGNOSIS — I5031 Acute diastolic (congestive) heart failure: Secondary | ICD-10-CM

## 2014-08-07 DIAGNOSIS — Z5189 Encounter for other specified aftercare: Secondary | ICD-10-CM

## 2014-08-07 DIAGNOSIS — M4712 Other spondylosis with myelopathy, cervical region: Secondary | ICD-10-CM

## 2014-08-07 LAB — BASIC METABOLIC PANEL
Anion gap: 12 (ref 5–15)
BUN: 27 mg/dL — ABNORMAL HIGH (ref 6–23)
CALCIUM: 9.4 mg/dL (ref 8.4–10.5)
CO2: 28 meq/L (ref 19–32)
Chloride: 103 mEq/L (ref 96–112)
Creatinine, Ser: 1.01 mg/dL (ref 0.50–1.35)
GFR calc Af Amer: 86 mL/min — ABNORMAL LOW (ref >90)
GFR calc non Af Amer: 74 mL/min — ABNORMAL LOW (ref >90)
GLUCOSE: 104 mg/dL — AB (ref 70–99)
Potassium: 4.4 mEq/L (ref 3.7–5.3)
Sodium: 143 mEq/L (ref 137–147)

## 2014-08-07 LAB — GLUCOSE, CAPILLARY
GLUCOSE-CAPILLARY: 144 mg/dL — AB (ref 70–99)
Glucose-Capillary: 147 mg/dL — ABNORMAL HIGH (ref 70–99)
Glucose-Capillary: 82 mg/dL (ref 70–99)
Glucose-Capillary: 103 mg/dL — ABNORMAL HIGH (ref 70–99)

## 2014-08-07 NOTE — Progress Notes (Signed)
Occupational Therapy Session Note  Patient Details  Name: Travis [name] MRN: [id number] Date of Birth: October 24, 1946  Today's Date: 08/07/2014 Time: 4098-1191 Time Calculation (min): 60 min  Short Term Goals: Week 1:  OT Short Term Goal 1 (Week 1): Pt. will perform bathing , seated and standing, with Max A  in order to increase I in self care.  OT Short Term Goal 1 - Progress (Week 1): Met OT Short Term Goal 2 (Week 1): Pt. will engage in 10 minutes of functional activity without requiring rest break in order to increase endurance for occupational performance. OT Short Term Goal 2 - Progress (Week 1): Met OT Short Term Goal 3 (Week 1): Pt. will perform LB dressing with Max A in order to increase I with ADLs.  OT Short Term Goal 3 - Progress (Week 1): Met OT Short Term Goal 4 (Week 1): Pt. will perform supine to sit with Max A in order to  increase I  in bed mobility. OT Short Term Goal 4 - Progress (Week 1): Met OT Short Term Goal 5 (Week 1): Pt. will perform static sitting balance at EOB with feet supported and supervision for 2 or more minutes in order to increase balance for occupational performance.  OT Short Term Goal 5 - Progress (Week 1): Met  Week 2:  OT Short Term Goal 1 (Week 2): Pt. will perform bathing , seated and standing, with Mod A  in order to increase I in self care.  OT Short Term Goal 1 - Progress (Week 2): Met OT Short Term Goal 2 (Week 2): Pt. will engage in 20 minutes of functional activity without requiring rest break in order to increase endurance for occupational performance. OT Short Term Goal 2 - Progress (Week 2): Met OT Short Term Goal 3 (Week 2): Pt. will perform LB dressing with Min A in order to increase I with ADLs.  OT Short Term Goal 3 - Progress (Week 2): Partly met OT Short Term Goal 4 (Week 2): Pt. will perform sit to stand from wheelchair height with Mod A in order to increase functional mobility.  OT Short Term Goal 4 - Progress (Week 2): Met OT  Short Term Goal 5 (Week 2): Pt. will perform supine to sit with min A in order to  increase I  in bed mobility. OT Short Term Goal 5 - Progress (Week 2): Met  Week 3:  OT Short Term Goal 1 (Week 3): short term goals = long term goals secondary to short length of stay remaining  Skilled Therapeutic Interventions/Progress Updates:  Patient with no complaints of direct pain during session, patient did mention increased back spasms this am Patient received seated in w/c. Patient propelled self into bathroom for shower transfer onto tub transfer bench; patient able to perform transfer with supervision for verbal safety cues. UB/LB bathing completed in seated position with set-up assist. Patient then dried, transferred > w/c with supervision(again for verbal safety cues), then propelled self > room for grooming tasks seated at sink and started UB/LB dressing. During dressing, patient with bowel urgency. Patient propelled self into bathroom secondary to urgency and performed toilet transfer with supervision, toileting (peri care & clothing management) completed at supervision level, patient standing for clothing management. After toileting, patient completed UB/LB dressing in sit<>stand position to pull pants to waist; steady assist required for dynamic standing balance/endurance. Therapist donned bilateral TEDs, then patient donned bilateral shoes independently. From here, patient worked on functional ambulation from w/c<>tub bench  in BR shower, simulated like at home secondary to patient's w/c will not fit into BR doorway. Patient overall supervision>min assist with functional mobility & transfers using RW during ambulation & dynamic standing. At end of session, left patient seated in w/c with all needs within reach.   Precautions:  Precautions Precautions: Fall Precaution Comments: chronic back pain - performs bending with no B,L,T and log rolls Restrictions Weight Bearing Restrictions: No  See FIM for  current functional status  Therapy/Group: Individual Therapy  Judine Arciniega 08/07/2014, 9:59 AM

## 2014-08-07 NOTE — Progress Notes (Addendum)
[place] PHYSICAL MEDICINE & REHABILITATION     PROGRESS NOTE    Subjective/Complaints: Pt without new issues Asking about D/C, would like 8/11 instead of 8/12 if possible Wife coming in for family training  Objective: Vital Signs: Blood pressure 138/46, pulse 51, temperature 98.4 F (36.9 C), temperature source Oral, resp. rate 19, height 6\' 1"  (1.854 m), weight 120 kg (264 lb 8.8 oz), SpO2 93.00%. No results found. No results found for this basename: WBC, HGB, HCT, PLT,  in the last 72 hours No results found for this basename: NA, K, CL, CO, GLUCOSE, BUN, CREATININE, CALCIUM,  in the last 72 hours CBG (last 3)   Recent Labs  08/06/14 1648 08/06/14 2040 08/07/14 0648  GLUCAP 112* 135* 147*    Wt Readings from Last 3 Encounters:  08/07/14 120 kg (264 lb 8.8 oz)  07/18/14 119.8 kg (264 lb 1.8 oz)  03/13/14 131.09 kg (289 lb)    Physical Exam:  Constitutional: He is oriented to person, place, and time. He appears well-developed and well-nourished. Side lying in bed  HENT: dentition fair. Oral mucosa pink/moist  Head: Normocephalic and atraumatic.  Eyes: Conjunctivae are normal. Pupils are equal, round, and reactive to light.  Neck: Normal range of motion. Neck supple.  Cardiovascular: Normal rate and regular rhythm. No murmurs  Respiratory: Effort normal and breath sounds normal. No respiratory distress. He has wheezes in upper airway.  GI: Soft. Bowel sounds are normal. He exhibits no distension. There is no tenderness.  Musculoskeletal: minimal edema in both legs.. Has low back pain with minimal lower extremity movement and with bed mobility Neurological: He is alert and oriented to person, place, and time. Appears rested    Able to follow basic commands without difficulty.  extremity: Intrinsic atrophy bilateral hands.  Motor strength is 4 minus bilateral grip 5 bilateral biceps and deltoid, 4/5 left triceps 4/5 right tricep, wrist extension 4+  3+ minus hip flexion  , 3+ knee extension, 4- ankle dorsiflexion and plantarflexion   Psych: pleasant, appropriate.  Assessment/Plan: 1. Functional deficits secondary to deconditioning, hx of cervical myelopathy which require 3+ hours per day of interdisciplinary therapy in a comprehensive inpatient rehab setting. Physiatrist is providing close team supervision and 24 hour management of active medical problems listed below. Physiatrist and rehab team continue to assess barriers to discharge/monitor patient progress toward functional and medical goals.    FIM: FIM - Bathing Bathing Steps Patient Completed: Chest;Right Arm;Left Arm;Abdomen;Left upper leg;Right upper leg;Buttocks;Front perineal area;Right lower leg (including foot);Left lower leg (including foot) Bathing: 5: Supervision: Safety issues/verbal cues  FIM - Upper Body Dressing/Undressing Upper body dressing/undressing steps patient completed: Thread/unthread right sleeve of pullover shirt/dresss;Thread/unthread left sleeve of pullover shirt/dress;Put head through opening of pull over shirt/dress;Pull shirt over trunk Upper body dressing/undressing: 6: More than reasonable amount of time FIM - Lower Body Dressing/Undressing Lower body dressing/undressing steps patient completed: Thread/unthread right underwear leg;Thread/unthread left underwear leg;Pull underwear up/down;Thread/unthread right pants leg;Fasten/unfasten left shoe;Fasten/unfasten right shoe;Don/Doff left shoe;Don/Doff right shoe;Fasten/unfasten pants;Thread/unthread left pants leg Lower body dressing/undressing: 4: Steadying Assist  FIM - Toileting Toileting steps completed by patient: Adjust clothing prior to toileting;Performs perineal hygiene;Adjust clothing after toileting Toileting Assistive Devices: Grab bar or rail for support Toileting: 0: Activity did not occur  FIM - Radio producer Devices: Elevated toilet seat;Grab bars Toilet Transfers: 4-To  toilet/BSC: Min A (steadying Pt. > 75%)  FIM - Bed/Chair Transfer Bed/Chair Transfer Assistive Devices: Arm rests;Bed rails;Walker Bed/Chair Transfer: 5:  Supine > Sit: Supervision (verbal cues/safety issues);5: Sit > Supine: Supervision (verbal cues/safety issues);4: Bed > Chair or W/C: Min A (steadying Pt. > 75%);4: Chair or W/C > Bed: Min A (steadying Pt. > 75%)  FIM - Locomotion: Wheelchair Distance: 300 Locomotion: Wheelchair: 5: Travels 150 ft or more: maneuvers on rugs and over door sills with supervision, cueing or coaxing FIM - Locomotion: Ambulation Locomotion: Ambulation Assistive Devices: Administrator Ambulation/Gait Assistance: 4: Min assist Locomotion: Ambulation: 1: Travels less than 50 ft with minimal assistance (Pt.>75%)  Comprehension Comprehension Mode: Auditory Comprehension: 5-Understands complex 90% of the time/Cues < 10% of the time  Expression Expression Mode: Verbal Expression: 5-Expresses complex 90% of the time/cues < 10% of the time  Social Interaction Social Interaction: 5-Interacts appropriately 90% of the time - Needs monitoring or encouragement for participation or interaction.  Problem Solving Problem Solving: 5-Solves complex 90% of the time/cues < 10% of the time  Memory Memory Mode: Not assessed Memory: 5-Recognizes or recalls 90% of the time/requires cueing < 10% of the time  Medical Problem List and Plan:  1. Functional deficits secondary to Cervical myelopathy with deconditioning after pneumonia requiring intubation  2. DVT Prophylaxis/Anticoagulation: Pharmaceutical: Lovenox  3. Chronic Pain Management: Off Volatren and NSAIDS.   -oxycontin stopped  -baclofen for cramps/spasms  -maintain gabapentin for neuropathic pain   -improved overall  4. Mood: Monitor as mentation improves. Will have LCSW follow for evaluation and support.  5. Neuropsych: This patient is capable of making decisions on his own behalf.  6. 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 HTN: Continue metoprolol and lasix. Cards recommendations: if needed to add norvasc and resume ACE if renal status stable.   -added 5mg  norvasc with improvement 9. Acute on chronic diastolic CHF: Daily weights. Low salt diet. Patient non compliant at home--will have RD educate on appropriate diet.  10. Hypotension related AKI: BUN holding around 50, recheck prior to D/C 11. BPH: foley d/c'ed Off cardura and ditropan at this time. Voiding fairly well 12. DM type 2 diet controlled:  Hgb A1c-7.0. BS poorly controlled due to stress/infection requiring lantus. Will monitor BS with ac/hs checks and start patient on oral agent. amaryl now at 4mg .   -off insulin  -sugars remain acceptable 13. Insomnia with RLS: Continue Mirapex. Sleep overall improved with better bed    14. Constipation:  bowel program.  LOS (Days) 20 A FACE TO FACE EVALUATION WAS PERFORMED  Rama Sorci E 08/07/2014 9:15 AM

## 2014-08-07 NOTE — Progress Notes (Signed)
Nutrition Brief Note  Pt identified by RN for a diet education requested by the pt and wife.   Spoke with pt and wife who request wanting a diet education about the pt's diet after discharging, which is planned for Wednesday 8/12. Discussed to re-visit tomorrow to educate on a low sodium diet and a diabetic diet.   Kallie Locks, MS, Provisional LDN Pager # [phone] After hours/ weekend pager # [phone]

## 2014-08-07 NOTE — Progress Notes (Signed)
Physical Therapy Session Note  Patient Details  Name: Travis [name] MRN: [id number] Date of Birth: [birth year]  Today's Date: 08/07/2014 Time: 3817-7116 & 1440-1530  Time Calculation (min): 70 min & 50 min  Short Term Goals: Week 1:  PT Short Term Goal 1 (Week 1): Pt will demonstrate log rolling in bed with rail req min A.  PT Short Term Goal 1 - Progress (Week 1): Met PT Short Term Goal 2 (Week 1): Pt will demonstrate side lie to sit transfer with rail req mod A.  PT Short Term Goal 2 - Progress (Week 1): Met PT Short Term Goal 3 (Week 1): Pt will demonstrate sit to stand with RW req max A.  PT Short Term Goal 3 - Progress (Week 1): Progressing toward goal PT Short Term Goal 4 (Week 1): Pt will demonstrate w/c propulsion x 100' req SBA.  PT Short Term Goal 4 - Progress (Week 1): Met PT Short Term Goal 5 (Week 1): Pt will demonstrate ambulation in // bars x 10' req mod A.  PT Short Term Goal 5 - Progress (Week 1): Met Week 2:  PT Short Term Goal 1 (Week 2): Pt to perform bed mobility with min Ax1person PT Short Term Goal 1 - Progress (Week 2): Met PT Short Term Goal 2 (Week 2): Pt to perform bed<>w/c transfers with max Ax1person PT Short Term Goal 2 - Progress (Week 2): Met PT Short Term Goal 3 (Week 2): Pt will demonstrate w/c propulsion x150' at mod(I) level PT Short Term Goal 3 - Progress (Week 2): Met PT Short Term Goal 4 (Week 2): Pt will ambulate 50' w/ RW and mod Ax1person PT Short Term Goal 4 - Progress (Week 2): Met PT Short Term Goal 5 (Week 2): Pt will negotiation up/down 2 steps + single rail with mod A x1person PT Short Term Goal 5 - Progress (Week 2): Progressing toward goal Week 3:  PT Short Term Goal 1 (Week 3): = LTG   Skilled Therapeutic Interventions/Progress Updates:  AM Session (56mn): Pt. Received seated in w/c with wife (Arbie Cookey present. Pt. Enthusiastic for physical therapy. Pt. Requested additional exercises (handout) for activities/strengthening outside of  normal 3 hours of daily therapy. Pt. Room<>gym via wife with max-A. Pt. Provided general strenghtning handouts.  1:1 Treatment focused on family education with functional transfers: car, w/c to mat table utilizing squat-pivot and stand-pivot methods. Pt. Ambulated 40 feet with (S) and RW on controled surface and performed >8 sit<>stand transfers: with RW and with HHA. Pt. Performed car transfer with wife educated on w/c positioning and assist methods for safe transfer.Pt. Assist level is (S) with RW. Wife demonstrates increased ability to transfer patient safely and return understanding verbally.Pt. Performed well with session and demonstrated increase endurance, strength and proper sequencing of transfer activities.  Pain: no c/o pain  Pt. Condition post therapy session: positioned seated in w/c with all needs within reach. Wife present and attending any additional needs.  PM Session (50 min): Pt. Received in gym with wife present. Enthusiastic about therapy, but indicates he is tired.   1:1 Treatment focused on dynamic balance standing with RW, standing exercises, and functional sit<>stand transfers with wife/spouse. Pt. Assist level is (S) overall decreasing to min A  with fatigue. Pt. Performed multiple sustained static standing with verbal and tactile cues for proper postural alignment, dynamic balance weigh shifting, single leg stance, tandem stance, toe raises, heel raises and additional activities to increase balance awareness and muscle strategies. Therapist demonstrated  additional supine/bed exercises and had patient teach-back. Therapist also provided visual demonstrations of sit<>Stand for patient and spouse.  Pain: no c/o pain  Pt. Condition post therapy session: positioned seated in w/c with all needs within reach. Wife present for additional needs.   Therapy Documentation Precautions:  Precautions Precautions: Fall Precaution Comments: chronic back pain - performs bending with  no B,L,T and log rolls Restrictions Weight Bearing Restrictions: No Pain: Pain Assessment Pain Assessment: No/denies pain Pain Score: 0-No pain Locomotion : Ambulation Ambulation/Gait Assistance: 4: Min assist Wheelchair Mobility Distance: >250   See FIM for current functional status  Therapy/Group: Individual Therapy  Juluis Mire 08/07/2014, 12:23 PM

## 2014-08-07 NOTE — Progress Notes (Signed)
Pt places himself on and off home cpap. Water added. RT will continue to monitor.

## 2014-08-08 ENCOUNTER — Inpatient Hospital Stay (HOSPITAL_COMMUNITY): Payer: Medicare Other

## 2014-08-08 ENCOUNTER — Inpatient Hospital Stay (HOSPITAL_COMMUNITY): Payer: Medicare Other | Admitting: *Deleted

## 2014-08-08 DIAGNOSIS — M4712 Other spondylosis with myelopathy, cervical region: Secondary | ICD-10-CM

## 2014-08-08 DIAGNOSIS — A419 Sepsis, unspecified organism: Secondary | ICD-10-CM

## 2014-08-08 DIAGNOSIS — Z5189 Encounter for other specified aftercare: Secondary | ICD-10-CM

## 2014-08-08 DIAGNOSIS — R5381 Other malaise: Secondary | ICD-10-CM

## 2014-08-08 DIAGNOSIS — I5031 Acute diastolic (congestive) heart failure: Secondary | ICD-10-CM

## 2014-08-08 DIAGNOSIS — J189 Pneumonia, unspecified organism: Secondary | ICD-10-CM

## 2014-08-08 LAB — GLUCOSE, CAPILLARY
GLUCOSE-CAPILLARY: 141 mg/dL — AB (ref 70–99)
Glucose-Capillary: 94 mg/dL (ref 70–99)
Glucose-Capillary: 106 mg/dL — ABNORMAL HIGH (ref 70–99)
Glucose-Capillary: 88 mg/dL (ref 70–99)

## 2014-08-08 NOTE — Progress Notes (Signed)
Recreational Therapy Session Note  Patient Details  Name: Travis [name] MRN: [id number] Date of Birth: 1946/06/09 Today's Date: 08/08/2014  Pain: no c/o pain, did c/o of soreness on bottom from lying on therapy mat in earlier sessions Skilled Therapeutic Interventions/Progress Updates: Session focused on discharge planning & family education with pt & daughter.  Pt stating multiple activities that he would like to participate in at discharge but required min instructional/questing cues to identify safe options for participation.  Discussion about community pursuits including negotiation of obstacles & energy conservation techniques.  Pt's daughter present & involved in discussion asking questions & assisting pt with identifying potential & realistic activities for participation post discharge.  Therapy/Group: Individual Therapy   Velina Drollinger 08/08/2014, 2:56 PM

## 2014-08-08 NOTE — Progress Notes (Signed)
Recreational Therapy Discharge Summary Patient Details  Name: Travis [name] MRN: [id number] Date of Birth: [birth year] Today's Date: 08/08/2014  Long term goals set: 1  Long term goals met: 1  Comments on progress toward goals: Pt has made great progress toward goal and is ready for discharge home tomorrow with family to provide 24 hour supervision/assist.  Pt requires set up assist for simple activities w/c level.  Education provided on community reintegration, energy conservation techniques, leisure participation, deep breathing exercises & pressure relief.  Pt states understanding of the above & is anxious to return to previously enjoyed activities. Reasons for discharge: discharge from hospital Patient/family agrees with progress made and goals achieved: Yes  [name] 08/08/2014, 2:59 PM

## 2014-08-08 NOTE — Progress Notes (Addendum)
[facility] PHYSICAL MEDICINE & REHABILITATION     PROGRESS NOTE    Subjective/Complaints: Pt without new issues Asking about D/C, would like 8/11 instead of 8/12 if possible Wife coming in for family training today and tomorrow  Objective: Vital Signs: Blood pressure 128/68, pulse 56, temperature 97.2 F (36.2 C), temperature source Oral, resp. rate 18, height _0  (1.854 m), weight 121 kg (266 lb 12.1 oz), SpO2 91.00%. No results found. No results found for this basename: WBC, HGB, HCT, PLT,  in the last 72 hours  Recent Labs  08/07/14 1017  NA 143  K 4.4  CL 103  GLUCOSE 104*  BUN 27*  CREATININE 1.01  CALCIUM 9.4   CBG (last 3)   Recent Labs  08/07/14 1621 08/07/14 2050 08/08/14 0715  GLUCAP 103* 144* 94    Wt Readings from Last 3 Encounters:  08/08/14 121 kg (266 lb 12.1 oz)  07/18/14 119.8 kg (264 lb 1.8 oz)  03/13/14 131.09 kg (289 lb)    Physical Exam:  Constitutional: He is oriented to person, place, and time. He appears well-developed and well-nourished. Side lying in bed  HENT: dentition fair. Oral mucosa pink/moist  Head: Normocephalic and atraumatic.  Eyes: Conjunctivae are normal. Pupils are equal, round, and reactive to light.  Neck: Normal range of motion. Neck supple.  Cardiovascular: Normal rate and regular rhythm. No murmurs  Respiratory: Effort normal and breath sounds normal. No respiratory distress. He has wheezes in upper airway.  GI: Soft. Bowel sounds are normal. He exhibits no distension. There is no tenderness.  Musculoskeletal: minimal edema in both legs.. Has low back pain with minimal lower extremity movement and with bed mobility Neurological: He is alert and oriented to person, place, and time. Appears rested    Able to follow basic commands without difficulty.  extremity: Intrinsic atrophy bilateral hands.  Motor strength is 4 minus bilateral grip 5 bilateral biceps and deltoid, 4/5 left triceps 4/5 right tricep, wrist  extension 4+  3+ minus hip flexion , 3+ knee extension, 4- ankle dorsiflexion and plantarflexion   Psych: pleasant, appropriate. Skin midline 1cm sacral decub,  Assessment/Plan: 1. Functional deficits secondary to deconditioning, hx of cervical myelopathy which require 3+ hours per day of interdisciplinary therapy in a comprehensive inpatient rehab setting. Physiatrist is providing close team supervision and 24 hour management of active medical problems listed below. Physiatrist and rehab team continue to assess barriers to discharge/monitor patient progress toward functional and medical goals. Team conference today please see physician documentation under team conference tab, met with team face-to-face to discuss problems,progress, and goals. Formulized individual treatment plan based on medical history, underlying problem and comorbidities. Will discuss moving up D/C to after therapy today if family training is completed ahead of schedule   FIM: FIM - Bathing Bathing Steps Patient Completed: Chest;Right Arm;Left Arm;Abdomen;Left upper leg;Right upper leg;Buttocks;Front perineal area;Right lower leg (including foot);Left lower leg (including foot) Bathing: 5: Supervision: Safety issues/verbal cues  FIM - Upper Body Dressing/Undressing Upper body dressing/undressing steps patient completed: Thread/unthread right sleeve of pullover shirt/dresss;Thread/unthread left sleeve of pullover shirt/dress;Put head through opening of pull over shirt/dress;Pull shirt over trunk Upper body dressing/undressing: 6: More than reasonable amount of time FIM - Lower Body Dressing/Undressing Lower body dressing/undressing steps patient completed: Thread/unthread right underwear leg;Thread/unthread left underwear leg;Pull underwear up/down;Thread/unthread right pants leg;Fasten/unfasten left shoe;Fasten/unfasten right shoe;Don/Doff left shoe;Don/Doff right shoe;Fasten/unfasten pants;Thread/unthread left pants  leg;Pull pants up/down Lower body dressing/undressing: 4: Steadying Assist (in standing)  FIM -  Toileting Toileting steps completed by patient: Adjust clothing prior to toileting;Performs perineal hygiene;Adjust clothing after toileting Toileting Assistive Devices: Grab bar or rail for support Toileting: 5: Supervision: Safety issues/verbal cues  FIM - Radio producer Devices: Elevated toilet seat;Grab bars Toilet Transfers: 5-To toilet/BSC: Supervision (verbal cues/safety issues);5-From toilet/BSC: Supervision (verbal cues/safety issues) (squat pivot technique)  FIM - Bed/Chair Transfer Bed/Chair Transfer Assistive Devices: Arm rests;Bed rails;Walker Bed/Chair Transfer: 5: Supine > Sit: Supervision (verbal cues/safety issues);5: Sit > Supine: Supervision (verbal cues/safety issues);4: Bed > Chair or W/C: Min A (steadying Pt. > 75%);4: Chair or W/C > Bed: Min A (steadying Pt. > 75%)  FIM - Locomotion: Wheelchair Distance: >250 Locomotion: Wheelchair: 5: Travels 150 ft or more: maneuvers on rugs and over door sills with supervision, cueing or coaxing FIM - Locomotion: Ambulation Locomotion: Ambulation Assistive Devices: Administrator Ambulation/Gait Assistance: 4: Min assist Locomotion: Ambulation: 1: Travels less than 50 ft with minimal assistance (Pt.>75%)  Comprehension Comprehension Mode: Auditory Comprehension: 6-Follows complex conversation/direction: With extra time/assistive device  Expression Expression Mode: Verbal Expression: 6-Expresses complex ideas: With extra time/assistive device  Social Interaction Social Interaction: 6-Interacts appropriately with others with medication or extra time (anti-anxiety, antidepressant).  Problem Solving Problem Solving: 7-Solves complex problems: Recognizes & self-corrects  Memory Memory Mode: Not assessed Memory: 5-Recognizes or recalls 90% of the time/requires cueing < 10% of the time  Medical  Problem List and Plan:  1. Functional deficits secondary to Cervical myelopathy with deconditioning after pneumonia requiring intubation  2. DVT Prophylaxis/Anticoagulation: Pharmaceutical: Lovenox  3. Chronic Pain Management: Off Volatren and NSAIDS.   -oxycontin stopped  -baclofen for cramps/spasms  -maintain gabapentin for neuropathic pain   -improved overall  4. Mood: Monitor as mentation improves. Will have LCSW follow for evaluation and support.  5. Neuropsych: This patient is capable of making decisions on his own behalf.  6. 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 HTN: Continue metoprolol and lasix. Cards recommendations: if needed to add norvasc and resume ACE if renal status stable.   -added 80m norvasc with improvement 9. Acute on chronic diastolic CHF: Daily weights. Low salt diet. Patient non compliant at home--will have RD educate on appropriate diet.  10. Hypotension related AKI: BUN holding around 50, recheck prior to D/C 11. BPH: foley d/c'ed Off cardura and ditropan at this time. Voiding fairly well 12. DM type 2 diet controlled:  Hgb A1c-7.0. BS poorly controlled due to stress/infection requiring lantus. Will monitor BS with ac/hs checks and start patient on oral agent. amaryl now at 453m   -off insulin  -sugars remain acceptable 13. Insomnia with RLS: Continue Mirapex. Sleep overall improved with better bed    14. Constipation:  bowel program. 15.  Decubitus ulcer improving may d/c mattress overlay LOS (Days) 21 A FACE TO FACE EVALUATION WAS PERFORMED  KIRSTEINS,ANDREW E 08/08/2014 8:15 AM

## 2014-08-08 NOTE — Progress Notes (Signed)
RT checked in with pt with home cpap and refilled water chamber and adjusted area cpap was in.  Pt will place self on/off.  Rt will continue to monitor.

## 2014-08-08 NOTE — Patient Care Conference (Signed)
Inpatient RehabilitationTeam Conference and Plan of Care Update Date: 08/08/2014   Time: 2:00 PM    Patient Name: Travis [name]      Medical Record Number: [id number]  Date of Birth: 26-May-1946 Sex: Male         Room/Bed: 0W88Q/9V69I-50 Payor Info: Payor: [employer] [place] [employer]IC[employer] / Plan: BLUE MEDICARE / Product Type: *No Product type* /    Admitting Diagnosis: Deconditionig  s p  PNA  Admit Date/Time:  07/18/2014  5:51 PM Admission Comments: No comment available   Primary Diagnosis:  Cervical arthritis with myelopathy Principal Problem: Cervical arthritis with myelopathy  Patient Active Problem List   Diagnosis Date Noted  . Acute diastolic heart failure 38/88/2800  . Cervical arthritis with myelopathy 07/18/2014  . Constipation - functional   . CAP (community acquired pneumonia) 07/10/2014  . Sepsis 07/10/2014  . HTN (hypertension) 07/10/2014  . OSA on CPAP 07/10/2014  . UTI (lower urinary tract infection) 07/10/2014  . Edema leg 07/10/2014  . Acute respiratory failure with hypoxia 07/10/2014  . 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 Other vitamin B12 deficiency anemia 07/13/2013  . 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 Polyneuropathy in other diseases classified elsewhere 02/17/2013  . Other general symptoms(780.99) 02/17/2013  . Abnormality of gait 02/17/2013  . Cervical spondylosis with myelopathy 02/17/2013    Expected Discharge Date: Expected Discharge Date: 08/09/14  Team Members Present: Physician leading conference: Dr. Alysia Penna Social Worker Present: Lennart Pall, LCSW Nurse Present: Nanine Means, RN PT Present: Raylene Everts, Cottie Banda, PT OT Present: Salome Spotted, OT SLP Present: Windell Moulding, SLP Other (Discipline and Name): Danne Baxter, RN [facility]) PPS Coordinator present : Daiva Nakayama, RN, CRRN     Current Status/Progress Goal Weekly Team Focus  Medical   balance improving, standing tolerance  increased  D/C this week to home   family ed   Bowel/Bladder   Continent of bowel and bladder. LBM 08/07/14  Remain continent of bowel and bladder  Continue to toliet patient PRN   Swallow/Nutrition/ Hydration             ADL's   Supervision for upper body BADL and transfers, min assist for lower body BADL and dynamic standing balance  overall supervision>min assist  ADL retraining, sit<>stands, functional mobility, functional transfers, dynamic standing balance/tolerance/endurance, activity tolerance/, safety awareness   Mobility   All goals met  Min A-Supervision  Family education and training; gait, balance and functional transfers for home   Communication             Safety/Cognition/ Behavioral Observations  No safety issues noted  Continue to encourage the patient to utilize call bell for assistance as needed  Continue to practice ICARE and hourly rounding, all patient to demonstrate use of call bell   Pain   PRN Ultram 28m given at 2126  Pain managed at or below rating of 3  Assess and monitor for pain, provide appropriate intervention when needed   Skin   Stage 2 sacrum, Gerhardts cream applied  No new skin breakdown  Reposition q 2 hours, Assess for skin breaksdown q shift    Rehab Goals Patient on target to meet rehab goals: Yes *See Care Plan and progress notes for long and short-term goals.  Barriers to Discharge: premorbid chronic weakness    Possible Resolutions to Barriers:  see above    Discharge Planning/Teaching Needs:  home with wife who will provide 24/7 assistance/ supervision  final education to be completed (OT) in a.m. on day of d/c.   Team Discussion:  Pt is reaching all goals and ready for d/c tomorrow follow last education session with OT.  No concerns.  Revisions to Treatment Plan:  None   Continued Need for Acute Rehabilitation Level of Care: The patient requires daily medical management by a physician with specialized training in physical  medicine and rehabilitation for the following conditions: Daily direction of a multidisciplinary physical rehabilitation program to ensure safe treatment while eliciting the highest outcome that is of practical value to the patient.: Yes Daily medical management of patient stability for increased activity during participation in an intensive rehabilitation regime.: Yes Daily analysis of laboratory values and/or radiology reports with any subsequent need for medication adjustment of medical intervention for : Neurological problems  Travis [name] 08/08/2014, 3:48 PM

## 2014-08-08 NOTE — Progress Notes (Signed)
Physical Therapy Discharge Summary  Patient Details  Name: Travis [name] MRN: [id number] Date of Birth: May 06, 1946  Today's Date: 08/08/2014 Time: 1430-1530 Time Calculation (min): 60 min  Patient has met 11 of 11 long term goals due to improved activity tolerance, improved balance, improved postural control, increased strength, decreased pain, ability to compensate for deficits, functional use of  right lower extremity and left lower extremity, improved attention, improved awareness and improved coordination.  Patient to discharge at [facility]) at w/c level, min A at household ambulatory level with RW.  Patient's family  is independent to provide the necessary physical and cognitive assistance at discharge.  Reasons goals not met: [name]  Recommendation:  Patient will benefit from ongoing skilled PT services in home health setting to continue to advance safe functional mobility, address ongoing impairments in decreased functional endurance, decreased strength, decreased sensation, decreased coordination, decreased balance, decreased motor control in B LE, decreased skin integrity on bottom, decreased overall functional mobility/transfers, safety and minimize fall risk.  Equipment: Museum/gallery conservator  Reasons for discharge: treatment goals met and discharge from hospital  Patient/family agrees with progress made and goals achieved: Yes  Skilled Therapeutic Interventions 1:1. Pt received sitting in w/c, ready for therapy. Focus this session on d/c planning and family education with pt's daughter. Pt and daughter educated on general home safety, fall prevention, reviewed overall equipment that pt would d/c home with and goals for [facility] PT. Pt mod(I) for w/c propulsion and req supervision for squat pivot t/f w/c<>tx mat. Daughter providing appropriate min guard-min A for pt during amb 88'x2 w/ RW and negotiation up/down 5 steps following initial therapist demonstration. Pt req cues for  appropriate step-to pattern during stair negotiation. Pt and daughter verbalized understanding of all education this session. Pt left sitting in w/c at end of session w/ all needs in reach.   PT Discharge Precautions/Restrictions Precautions Precautions: Fall Precaution Comments: chronic back pain - performs bending with no B,L,T and log rolls Restrictions Weight Bearing Restrictions: No Vital Signs Therapy Vitals Temp: 97.8 F (36.6 C) Temp src: Oral Pulse Rate: 53 Resp: 20 BP: 149/75 mmHg Patient Position (if appropriate): Sitting Oxygen Therapy SpO2: 96 % O2 Device: None (Room air) Pain Pain Assessment Pain Assessment: 0-10 Pain Score: 2  Pain Type: Chronic pain Pain Location: Back Pain Onset: On-going Pain Intervention(s): Repositioned;Distraction Vision/Perception  Wears glasses at all times; no change from baseline Cognition Overall Cognitive Status: Within Functional Limits for tasks assessed Arousal/Alertness: Awake/alert Orientation Level: Oriented X4 Attention: Selective Selective Attention: Appears intact Memory: Appears intact Awareness: Impaired Awareness Impairment: Anticipatory impairment Problem Solving: Appears intact Safety/Judgment: Appears intact Sensation Sensation Light Touch: Impaired Detail Light Touch Impaired Details: Impaired LLE Proprioception: Impaired Detail Proprioception Impaired Details: Impaired LLE;Impaired RLE Additional Comments: Pt reports N/T in L lateral L LE at all times; pt unable to if demonstrating decreased B foot clearance during amb Coordination Gross Motor Movements are Fluid and Coordinated: No Fine Motor Movements are Fluid and Coordinated: Yes Heel Shin Test: Unable due to muscle weakness Motor  Motor Motor: Other (comment) Motor - Discharge Observations: generalized weakness B LE>B UE, decreased motor control in B LE- improved since initial eval  Mobility Bed Mobility Bed Mobility: Right Sidelying to  Sit;Sit to Sidelying Right Right Sidelying to Sit: 5: Supervision Right Sidelying to Sit Details: Verbal cues for precautions/safety;Verbal cues for technique Sit to Sidelying Right: 5: Supervision Sit to Sidelying Right Details: Verbal cues for technique;Verbal cues for precautions/safety Transfers Transfers:  Yes Sit to Stand: 4: Min guard;4: Min assist Sit to Stand Details: Verbal cues for precautions/safety;Verbal cues for technique Stand to Sit: 4: Min guard;4: Min assist Stand to Sit Details (indicate cue type and reason): Verbal cues for precautions/safety;Verbal cues for technique Stand Pivot Transfers: 4: Min assist Stand Pivot Transfer Details: Verbal cues for precautions/safety;Verbal cues for technique Squat Pivot Transfers: 5: Supervision Squat Pivot Transfer Details: Verbal cues for precautions/safety Locomotion  Ambulation Ambulation: Yes Ambulation/Gait Assistance: 4: Min guard;4: Min assist Ambulation Distance (Feet): 88 Feet Assistive device: Rolling walker Ambulation/Gait Assistance Details: Verbal cues for precautions/safety;Verbal cues for gait pattern;Verbal cues for safe use of DME/AE Gait Gait: Yes Gait Pattern: Impaired Gait Pattern: Step-to pattern;Decreased dorsiflexion - left;Decreased dorsiflexion - right;Decreased hip/knee flexion - left;Decreased hip/knee flexion - right;Wide base of support;Trunk flexed Stairs / Additional Locomotion Stairs: Yes Stairs Assistance: 4: Min assist;4: Min guard Stairs Assistance Details: Verbal cues for precautions/safety;Verbal cues for gait pattern Stair Management Technique: Step to pattern;Sideways;One rail Right Number of Stairs: 3 Curb: Other (comment) Architect: Yes Wheelchair Assistance: 6: Modified independent (Device/Increase time) Environmental health practitioner: Both upper extremities Wheelchair Parts Management: Independent Distance: 250  Trunk/Postural Assessment  Cervical  Assessment Cervical Assessment: Exceptions to [facility] Cervical AROM Overall Cervical AROM Comments: forward head noted, limited AROM in all planes~50% of normal, pt reports stiffness Thoracic Assessment Thoracic Assessment: Exceptions to [facility] Thoracic AROM Overall Thoracic AROM: Deficits;Due to pain Overall Thoracic AROM Comments: kyphotic, reports pain when supine Lumbar Assessment Lumbar Assessment: Exceptions to [facility] Lumbar AROM Overall Lumbar AROM: Deficits;Due to pain Overall Lumbar AROM Comments: pt reports intermittent LBP Postural Control Postural Control: Deficits on evaluation Righting Reactions: decreased in standing  Balance Balance Balance Assessed: Yes Static Sitting Balance Static Sitting - Balance Support: Feet supported Static Sitting - Level of Assistance: 6: Modified independent (Device/Increase time) Dynamic Sitting Balance Dynamic Sitting - Balance Support: Feet supported;Right upper extremity supported;Left upper extremity supported Dynamic Sitting - Level of Assistance: 6: Modified independent (Device/Increase time) Dynamic Sitting - Balance Activities: Forward lean/weight shifting;Lateral lean/weight shifting;Reaching across midline;Reaching for objects Static Standing Balance Static Standing - Balance Support: Bilateral upper extremity supported;Left upper extremity supported;Right upper extremity supported Static Standing - Level of Assistance: 4: Min assist Dynamic Standing Balance Dynamic Standing - Balance Support: Right upper extremity supported;Left upper extremity supported;Bilateral upper extremity supported Dynamic Standing - Level of Assistance: 4: Min assist Dynamic Standing - Balance Activities: Forward lean/weight shifting;Lateral lean/weight shifting;Reaching for objects Extremity Assessment  See OT eval for UE assessment RLE Assessment RLE Assessment: Exceptions to [facility] RLE Strength RLE Overall Strength: Deficits RLE Overall Strength Comments:  Hip flexion: 3-/5; knee flexion/ext: 4-/5; DF/PF: 4-/5 LLE Assessment LLE Assessment: Exceptions to [facility] LLE Strength LLE Overall Strength: Deficits LLE Overall Strength Comments: Hip flexion: 3-/5; knee flexion/ext: 4/5; DF/PF: 4-/5  See FIM for current functional status  Otis Brace S 08/08/2014, 5:30 PM

## 2014-08-08 NOTE — Progress Notes (Signed)
Occupational Therapy Session Note  Patient Details  Name: BARTHOLOMEW LILJEDAHL MRN: [id number] Date of Birth: [birth year]  Today's Date: 08/08/2014 Time: 0729-0829 Time Calculation (min): 60 min  Short Term Goals: Week 3:  OT Short Term Goal 1 (Week 3): short term goals = long term goals secondary to short length of stay remaining  Skilled Therapeutic Interventions/Progress Updates: ADL-retraining with focus on "grad day" performance of functional mobility using RW to ambulate short distances (from w/c at doorway to toilet and to tub bench), transfers, and performance of BADL.    Pt received seated in his w/c and was able to rise and stand from w/c to RW unassisted but required steadying assist when standing and close supervision during ambulation due to weak right LE and rapid fatigue.   Pt completed transfer to tub bench with only supervision and then bathed unassisted.   From tub bench pt ambulated to toilet and completed transfer and then simulated need for toilet hygiene.   Pt elected to dress seated in w/c at sink where he groomed first and then dressed with setup assist for upper body and min assist for lower body (pulling up pants).   Pt reports confidence with his bed mobility without need for devices (bed rails) as evidenced by transfers on/off raised mat during therapies.   Pt left in room with food tray setup to complete self-feeding; call light and phone placed within reach.    Therapy Documentation Precautions:  Precautions Precautions: Fall Precaution Comments: chronic back pain - performs bending with no B,L,T and log rolls Restrictions Weight Bearing Restrictions: No  Pain: Pain Assessment Pain Assessment: No/denies pain  ADL: ADL ADL Comments: see FIM  See FIM for current functional status  Therapy/Group: Individual Therapy  Fate Caster 08/08/2014, 10:55 AM

## 2014-08-08 NOTE — Progress Notes (Signed)
Per Patient request, administered Xanax and Ultram to enable better sleep. Will continue to monitor.  Ardys Hataway

## 2014-08-08 NOTE — Progress Notes (Signed)
Physical Therapy Session Note  Patient Details  Name: Travis [name] MRN: [id number] Date of Birth: 16-May-1946  Today's Date: 08/08/2014 Time: 0910-1010 Time Calculation (min): 60 min  Short Term Goals: Week 3:  PT Short Term Goal 1 (Week 3): = LTG   Skilled Therapeutic Interventions/Progress Updates:  1:1. Pt received sitting in w/c, ready for therapy. Focus this session on d/c planning and family training with pt's wife. Pt and wife educated on general safety in home environment, falls prevention, d/c process and goals of HH PT. Pt mod(I) for w/c propulsion in both controlled and home environment areas >250'. Pt's wife providing supervision for t/f R sidelying<>sit on tx mat elevated to simulate pt's standard bed at home. Instruction on use of RW for UE support as needed during bed mobility with wife stabilizing RW. Pt's wife providing appropriate min guard-min A for pt during amb x50', car transfer (both SPT w/ RW and squat pivot t/f w/ w/c) and up/down 2 steps w/ single R rail. Pt cued for correct stair negotiation pattern and wife intermittently cued for proximity to pt. Pt's wife provided picture of shower set up at home, therapist providing min A for pt to step over shower threshold and sit on shower seat, however, very difficult due to limited space for appropriate simulation. PT recommended that pt defer to OT for further direction to ensure overall safety. Both pt and wife verbalized understanding of all education this session. Pt left sitting in w/c w/ all needs in reach and wife in room.   Therapy Documentation Precautions:  Precautions Precautions: Fall Precaution Comments: chronic back pain - performs bending with no B,L,T and log rolls Restrictions Weight Bearing Restrictions: No   Pain: Pain Assessment Pain Assessment: No/denies pain  See FIM for current functional status  Therapy/Group: Individual Therapy  Gilmore Laroche 08/08/2014, 12:39 PM

## 2014-08-08 NOTE — Plan of Care (Signed)
Problem: Food- and Nutrition-Related Knowledge Deficit (NB-1.1) Goal: Nutrition education Formal process to instruct or train a patient/client in a skill or to impart knowledge to help patients/clients voluntarily manage or modify food choices and eating behavior to maintain or improve health. Outcome: Completed/Met Date Met:  08/08/14 Nutrition Education Note  Pt and wife requested an education on a low sodium diet as well as a diabetic diet. Pt was provided "Low sodium Nutrition Therapy", "Sodium content of foods", and "Type 2 Diabetes Nutrition Therapy" handouts from the Academy of [employer]etics. Reviewed patient's dietary recall. Provided examples on ways to decrease sodium and fat intake in diet. Discouraged intake of processed foods and use of salt shaker. Encouraged fresh fruits and vegetables as well as whole grain sources of carbohydrates to maximize fiber intake. Discussed foods that contain carbohydrates and how many grams of carbohydrates are needed at meals. Provided examples of diabetic friendly drinks.Teach back method used.  Expect good compliance.  Body mass index is 35.2 kg/(m^2). Pt meets criteria for class II obesity based on current BMI.  Current diet order is heart healthy/carb modified, patient is consuming approximately 100% of meals at this time. Labs and medications reviewed. No further nutrition interventions warranted at this time. RD contact information provided. If additional nutrition issues arise, please re-consult RD.  Kallie Locks, MS, Provisional LDN Pager # [phone] After hours/ weekend pager # [phone]

## 2014-08-09 ENCOUNTER — Encounter (HOSPITAL_COMMUNITY): Payer: Medicare Other | Admitting: Occupational Therapy

## 2014-08-09 LAB — GLUCOSE, CAPILLARY: Glucose-Capillary: 132 mg/dL — ABNORMAL HIGH (ref 70–99)

## 2014-08-09 MED ORDER — BACLOFEN 10 MG PO TABS: 10.0000 mg | ORAL_TABLET | Freq: Three times a day (TID) | ORAL | Status: DC

## 2014-08-09 MED ORDER — ACETAMINOPHEN 325 MG PO TABS: 325.0000 mg | ORAL_TABLET | ORAL | Status: DC | PRN

## 2014-08-09 MED ORDER — OXYBUTYNIN CHLORIDE 5 MG PO TABS: 2.5000 mg | ORAL_TABLET | Freq: Two times a day (BID) | ORAL | Status: DC

## 2014-08-09 MED ORDER — POLYETHYLENE GLYCOL 3350 17 G PO PACK: 17.0000 g | PACK | Freq: Every day | ORAL | Status: AC | PRN

## 2014-08-09 MED ORDER — METOPROLOL SUCCINATE ER 50 MG PO TB24: 50.0000 mg | ORAL_TABLET | Freq: Every day | ORAL | Status: DC

## 2014-08-09 MED ORDER — ESCITALOPRAM OXALATE 5 MG PO TABS: 5.0000 mg | ORAL_TABLET | Freq: Every day | ORAL | Status: DC

## 2014-08-09 MED ORDER — ALPRAZOLAM 0.5 MG PO TABS: 0.2500 mg | ORAL_TABLET | Freq: Three times a day (TID) | ORAL | Status: DC | PRN

## 2014-08-09 MED ORDER — FUROSEMIDE 40 MG PO TABS: 40.0000 mg | ORAL_TABLET | Freq: Every day | ORAL | Status: DC

## 2014-08-09 MED ORDER — AMLODIPINE BESYLATE 5 MG PO TABS: 5.0000 mg | ORAL_TABLET | Freq: Every day | ORAL | Status: DC

## 2014-08-09 MED ORDER — FUROSEMIDE 40 MG PO TABS: ORAL_TABLET | ORAL | Status: DC

## 2014-08-09 MED ORDER — PRAMIPEXOLE DIHYDROCHLORIDE 0.5 MG PO TABS: 0.5000 mg | ORAL_TABLET | Freq: Two times a day (BID) | ORAL | Status: DC

## 2014-08-09 MED ORDER — TRAMADOL HCL 50 MG PO TABS: 50.0000 mg | ORAL_TABLET | Freq: Four times a day (QID) | ORAL | Status: DC | PRN

## 2014-08-09 MED ORDER — GABAPENTIN 400 MG PO CAPS: 400.0000 mg | ORAL_CAPSULE | Freq: Three times a day (TID) | ORAL | Status: DC

## 2014-08-09 MED ORDER — GLIMEPIRIDE 4 MG PO TABS: 4.0000 mg | ORAL_TABLET | Freq: Every day | ORAL | Status: DC

## 2014-08-09 NOTE — Discharge Summary (Signed)
Physician Discharge Summary  Patient ID: Travis [name] MRN: [id number] DOB/AGE: 1946/06/17 68 y.o.  Admit date: 07/18/2014 Discharge date: 08/09/2014  Discharge Diagnoses:  Principal Problem:   Cervical arthritis with myelopathy Active Problems:   Polyneuropathy in other diseases classified elsewhere   Abnormality of gait   Lumbosacral spinal stenosis   HTN (hypertension)   OSA on CPAP   Discharged Condition: Stable.   Significant Diagnostic Studies: US Renal  07/14/2014   CLINICAL DATA:  AKI, elevated creatinine, hypertension, diabetes  EXAM: RENAL/URINARY TRACT ULTRASOUND COMPLETE  COMPARISON:  None available  FINDINGS: Right Kidney:  Length: 11.2 cm. Echogenicity within normal limits. No mass or hydronephrosis visualized.  Left Kidney:  Length: 12.6 cm. Echogenicity within normal limits. No mass or hydronephrosis visualized.  Bladder:  Decompressed by Foley catheter  IMPRESSION: Negative.  No hydronephrosis.   Electronically Signed   By: Arne Cleveland M.D.   On: 07/14/2014 08:00    Labs:  Basic Metabolic Panel:  Recent Labs Lab 08/07/14 1017  NA 143  K 4.4  CL 103  CO2 28  GLUCOSE 104*  BUN 27*  CREATININE 1.01  CALCIUM 9.4    CBC: CBC Latest Ref Rng 07/19/2014 07/18/2014 07/17/2014  WBC 4.0 - 10.5 K/uL 7.3 6.5 6.4  Hemoglobin 13.0 - 17.0 g/dL 12.7(L) 11.8(L) 11.0(L)  Hematocrit 39.0 - 52.0 % 39.8 37.6(L) 34.3(L)  Platelets 150 - 400 K/uL 316 288 229     CBG:  Recent Labs Lab 08/08/14 0715 08/08/14 1124 08/08/14 1642 08/08/14 2108 08/09/14 0652  GLUCAP 94 106* 88 141* 132*    Brief HPI:   Travis [name] is a 68 y.o. 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 CXR with evidence of pneumonia and patient with progressive respiratory failure requiring intubation past admission. He was started on IV antibiotics for sepsis as well as  IVF bolus for hypotension. Hospital course complicated by AKI due to ATN, agitation as well as intermittent fevers. Renal ultrasound normal without hydronephrosis. He completed antibiotics course was extubated on 07/19 without difficulty. Renal status slowly improving with increase in UOP. Has had SOB that has improved with diuresis. 2D echo with normal LVF. Cardiology consulted for input on CHF and recommended continuing IV diuresis with question of acute on chronic CHF and monitoring for signs of AKI and may be able to decrease lasix to daily at discharge. Agitation has resolved but patient has had complaints of back pain and spasms.  OxyContin and oxycodone were added for pain management. Therapy has been ongoing and CIR was recommended for follow up.    Hospital Course: Travis [name] was admitted to rehab 07/18/2014 for inpatient therapies to consist of PT, ST and OT at least three hours five days a week. Past admission physiatrist, therapy team and rehab RN have worked together to provide customized collaborative inpatient rehab. Daily weights have been monitored and patient was maintained on CM diet with low salt restrictions. Blood pressure were monitored on tid basis and Norvasc was added to improve control. Renal status has continued to improve and lasix was tapered to once a day due to complaints of frequency. He has had increase in peripheral edema over the past 1-2 days therefore pm dose of lasix was added at discharge. amaryl was added for diabetes management and lantus was tapered off. BS were monitored on ac/hs basis and  are reasonably controlled. Diet education was done with patient and wife with emphasis on compliance to help with management of BS as well as edema control.   Patient has had sedation as well as disorientation on narcotics therefore OxyContin and oxycodone were discontinued per patient request. Mirapex was increased to help with RLS and baclofen was added to help with back pain  due to spasms. Currently pain is reported to be well controlled with prn use of tramadol and/or tylenol.  Patient has had some anxiety as well as dysphoria due to adjustment reaction.  Lexapro was added for mood stabilization and low dose xanax at bedtime has been effective in treating anxiety as well as insomnia.  Patient has shown good motivation and has made good gains during his rehab stay. Currently, he is modified independent at wheelchair level and will continue to receive [place], [place] and [place] by [facility] past discharge. On 08/09/14 he was discharged to home in improved condition.    Rehab course: During patient's stay in rehab weekly team conferences were held to monitor patient's progress, set goals and discuss barriers to discharge. Patient has had improvement in activity tolerance, balance, postural control, as well as ability to compensate for deficits. He requires min assist for transfers, min/guard assist for ambulation as well as stair navigation. He is able to ambulate 34'  X 2 with RW. He is able to complete bathing and Upper body dressing with supervision. He requires min assist with LB dressing with increased amount of time. Family education was done with wife regarding all aspects of care.      Disposition: 01-Home or Self Care  Diet: Diabetic diet. Low salt.   Special Instructions: 1. Need to set up post hospital follow up with primary care in next 1-2 weeks.  2. Check blood sugars twice a day and record---breakfast/supper alternating with lunch/bedtime every other day. 3. Wear support stocking when out of bed. Weight daily and contact MD if weight starts trending upwards.     Medication List    STOP taking these medications       diclofenac 75 MG EC tablet  Commonly known as:  VOLTAREN     diphenhydrAMINE 25 MG tablet  Commonly known as:  BENADRYL     doxazosin 4 MG tablet  Commonly known as:  CARDURA     gabapentin 600 MG tablet  Commonly known as:   NEURONTIN  Replaced by:  gabapentin 400 MG capsule     ibuprofen 200 MG tablet  Commonly known as:  ADVIL,MOTRIN     lisinopril-hydrochlorothiazide 20-12.5 MG per tablet  Commonly known as:  PRINZIDE,ZESTORETIC      TAKE these medications       acetaminophen 325 MG tablet  Commonly known as:  TYLENOL  Take 1-2 tablets (325-650 mg total) by mouth every 4 (four) hours as needed for mild pain.     ALPRAZolam 0.5 MG tablet  Commonly known as:  XANAX  Take 0.5-1 tablets (0.25-0.5 mg total) by mouth 3 (three) times daily as needed for anxiety.     amLODipine 5 MG tablet  Commonly known as:  NORVASC  Take 1 tablet (5 mg total) by mouth daily.     baclofen 10 MG tablet  Commonly known as:  LIORESAL  Take 1 tablet (10 mg total) by mouth 3 (three) times daily. For muscle spasms     ciclopirox 8 % solution  Commonly known as:  PENLAC  Apply 1 application topically at bedtime  as needed (dry legs).     escitalopram 5 MG tablet  Commonly known as:  LEXAPRO  Take 1 tablet (5 mg total) by mouth at bedtime.     furosemide 40 MG tablet  Commonly known as:  LASIX  Take a whole pill with breakfast and take 1/2 a pill after lunch     gabapentin 400 MG capsule  Commonly known as:  NEURONTIN  Take 1 capsule (400 mg total) by mouth every 8 (eight) hours.     glimepiride 4 MG tablet  Commonly known as:  AMARYL  Take 1 tablet (4 mg total) by mouth daily with breakfast. For diabetes     levothyroxine 150 MCG tablet  Commonly known as:  SYNTHROID, LEVOTHROID  Take 150 mcg by mouth daily.     metoprolol succinate 50 MG 24 hr tablet  Commonly known as:  TOPROL-XL  Take 1 tablet (50 mg total) by mouth daily.     oxybutynin 5 MG tablet  Commonly known as:  DITROPAN  Take 0.5 tablets (2.5 mg total) by mouth 2 (two) times daily.     polyethylene glycol packet  Commonly known as:  MIRALAX / GLYCOLAX  Take 17 g by mouth daily as needed. For constipation     pramipexole 0.5 MG tablet   Commonly known as:  MIRAPEX  Take 1 tablet (0.5 mg total) by mouth 2 (two) times daily.     traMADol 50 MG tablet  Commonly known as:  ULTRAM  Take 1 tablet (50 mg total) by mouth every 6 (six) hours as needed for moderate pain.           Follow-up Information   Follow up with Meredith Staggers, MD On 09/18/2014. (Be there at 11:20 for 11:40 am  appointment)    Specialty:  Physical Medicine and Rehabilitation   Contact information:   [address], [place] [address] [phone]       Follow up with Garret Reddish, MD On 12/21/2014. (Be there at 9:45 am for 10 am./ Take insurance and ID cards. )    Specialty:  Family Medicine   Contact information:   [place] [address] [phone]       Follow up with Darden Amber., MD On 08/25/2014. (Be there at 9:15 am for follow up.)    Specialty:  Cardiology   Contact information:   [place] [address] [phone]       Signed: Bary Leriche 08/09/2014, 4:13 PM

## 2014-08-09 NOTE — Discharge Instructions (Signed)
Inpatient Rehab Discharge Instructions  Travis [name] Discharge date and time:  08/09/14  Activities/Precautions/ Functional Status: Activity: activity as tolerated Diet: Diabetic diet. Low salt.  Wound Care: continue to apply zinc oxide cream to affected area.  Functional status:  ___ No restrictions     ___ Walk up steps independently _X__ 24/7 supervision/assistance   ___ Walk up steps with assistance ___ Intermittent supervision/assistance  ___ Bathe/dress independently ___ Walk with walker     _X__ Bathe/dress with assistance ___ Walk Independently    ___ Shower independently _X__ Walk with assistance    ___ Shower with assistance _X__ No alcohol     ___ Return to work/school ________   COMMUNITY REFERRALS UPON DISCHARGE:    Home Health:   PT     OT       RN                    Agency: [facility] Phone: [phone]   Medical Equipment/Items Ordered: wheelchair, cushion, rolling walker and bedside commode                                                     Agency/Supplier: [facility]   Special Instructions:  1. Need to set up post hospital follow up with primary care in next 1-2 weeks.  2. Check blood sugars twice a day and record---breakfast/supper alternating with lunch/bedtime every other day. 3. Wear support stocking when out of bed. Weight yourself daily. Contact MD if you start retaining fluid or weight starts going u     Edema Edema is an abnormal buildup of fluids in your bodytissues. Edema is somewhatdependent on gravity to pull the fluid to the lowest place in your body. That makes the condition more common in the legs and thighs (lower extremities). Painless swelling of the feet and ankles is common and becomes more likely as you get older. It is also common in looser tissues, like around your eyes.  When the affected area is squeezed, the fluid may move out of that spot and leave a dent for a few moments. This dent is called pitting.  CAUSES    There are many possible causes of edema. Eating too much salt and being on your feet or sitting for a long time can cause edema in your legs and ankles. Hot weather may make edema worse. Common medical causes of edema include:  Heart failure.  Liver disease.  Kidney disease.  Weak blood vessels in your legs.  Cancer.  An injury.  Pregnancy.  Some medications.  Obesity. SYMPTOMS  Edema is usually painless.Your skin may look swollen or shiny.  DIAGNOSIS  Your health care provider may be able to diagnose edema by asking about your medical history and doing a physical exam. You may need to have tests such as X-rays, an electrocardiogram, or blood tests to check for medical conditions that may cause edema.  TREATMENT  Edema treatment depends on the cause. If you have heart, liver, or kidney disease, you need the treatment appropriate for these conditions. General treatment may include:  Elevation of the affected body part above the level of your heart.  Compression of the affected body part. Pressure from elastic bandages or support stockings squeezes the tissues and forces fluid back into the blood vessels. This keeps fluid  from entering the tissues.  Restriction of fluid and salt intake.  Use of a water pill (diuretic). These medications are appropriate only for some types of edema. They pull fluid out of your body and make you urinate more often. This gets rid of fluid and reduces swelling, but diuretics can have side effects. Only use diuretics as directed by your health care provider. HOME CARE INSTRUCTIONS   Keep the affected body part above the level of your heart when you are lying down.   Do not sit still or stand for prolonged periods.   Do not put anything directly under your knees when lying down.  Do not wear constricting clothing or garters on your upper legs.   Exercise your legs to work the fluid back into your blood vessels. This may help the swelling go  down.   Wear elastic bandages or support stockings to reduce ankle swelling as directed by your health care provider.   Eat a low-salt diet to reduce fluid if your health care provider recommends it.   Only take medicines as directed by your health care provider. SEEK MEDICAL CARE IF:   Your edema is not responding to treatment.  You have heart, liver, or kidney disease and notice symptoms of edema.  You have edema in your legs that does not improve after elevating them.   You have sudden and unexplained weight gain. SEEK IMMEDIATE MEDICAL CARE IF:   You develop shortness of breath or chest pain.   You cannot breathe when you lie down.  You develop pain, redness, or warmth in the swollen areas.   You have heart, liver, or kidney disease and suddenly get edema.  You have a fever and your symptoms suddenly get worse. MAKE SURE YOU:   Understand these instructions.  Will watch your condition.  Will get help right away if you are not doing well or get worse. Document Released: 12/15/2005 Document Revised: 05/01/2014 Document Reviewed: 10/07/2013 [facility] Patient Information 2015 [place], [place]. This information is not intended to replace advice given to you by your health care provider. Make sure you discuss any questions you have with your health care provider.    My questions have been answered and I understand these instructions. I will adhere to these goals and the provided educational materials after my discharge from the hospital.  Patient/Caregiver Signature _______________________________ Date __________  Clinician Signature _______________________________________ Date __________  Please bring this form and your medication list with you to all your follow-up doctor's appointments.

## 2014-08-09 NOTE — Progress Notes (Signed)
Occupational Therapy Discharge Summary  Patient Details  Name: Travis [name] MRN: [id number] Date of Birth: [birth year]  Today's Date: 08/09/2014 Time: 7510-2585 Time Calculation (min): 60 min  Patient has met 10 of 10 long term goals due to improved activity tolerance, improved balance, postural control and B LE strength, and ADL retraining..  Patient to discharge at overall Supervision - Min A level.  Patient's care partner is independent to provide the necessary physical assistance at discharge.    Recommendation:  Patient will benefit from ongoing skilled OT services in home health setting to continue to advance functional skills in the area of BADL.  Equipment: Tub transfer bench and drop arm commode chair  Reasons for discharge: treatment goals met  Patient/family agrees with progress made and goals achieved: Yes  OT intervention session: Session 1 ( 2778-2423): Upon entering the room, pt seated in wheelchair. Wife present in room for family education this session secondary to patient discharge today. OT session with focus on ADL retraining, dynamic standing balance, energy conservation, and pt and family education. Pt ambulated from doorway to tub transfer bench within bathroom with use of RW and supervision - steady assist. Sit to stand from wheelchair x 4 reps with supervision. Bathing performed in walk in shower with supervision. OT discussed how to decreased fall risks within the bathroom and recommended one non slip bath mat for outside of bathtub as well as purchase of steady strips. Pt and wife verbalized understanding. Dressing and grooming performed with pt at wheelchair level. Pt able to obtain all items from dresser needed at wheelchair level. Wife demonstrated ability to provided the appropriate close supervision and steady assist during session. Pt to discharge home today with all goals met.   OT Discharge Precautions/Restrictions  Precautions Precautions:  Fall Precaution Comments: chronic back pain - performs bending with no B,L,T and log rolls Restrictions Weight Bearing Restrictions: No Pain Pain Assessment Pain Assessment: 0-10 ADL ADL ADL Comments: see FIM Vision/Perception  Vision- History Baseline Vision/History: Wears glasses Wears Glasses: At all times Patient Visual Report: No change from baseline Vision- Assessment Vision Assessment?: No apparent visual deficits  Cognition Overall Cognitive Status: Within Functional Limits for tasks assessed Arousal/Alertness: Awake/alert Orientation Level: Oriented X4 Attention: Selective Focused Attention: Appears intact Sustained Attention: Appears intact Memory: Appears intact Awareness: Impaired Awareness Impairment: Anticipatory impairment Problem Solving: Appears intact Safety/Judgment: Appears intact Sensation Sensation Light Touch: Impaired Detail Light Touch Impaired Details: Impaired LLE Proprioception: Impaired Detail Proprioception Impaired Details: Impaired LLE;Impaired RLE Coordination Gross Motor Movements are Fluid and Coordinated: No Fine Motor Movements are Fluid and Coordinated: Yes Coordination and Movement Description: functional tasks with increased time Motor  Motor Motor - Skilled Clinical Observations: generalized weakness, significant weekness in B LEs, pain increased with movements Motor - Discharge Observations: generalized weakness B LE>B UE, decreased motor control in B LE- improved since initial eval Mobility  Transfers Transfers: Sit to Stand;Stand to Sit Sit to Stand: 5: Supervision Sit to Stand Details: Verbal cues for precautions/safety;Verbal cues for technique Stand to Sit: 5: Supervision  Trunk/Postural Assessment  Cervical Assessment Cervical Assessment: Exceptions to [facility] Cervical AROM Overall Cervical AROM: Deficits;Due to pain Overall Cervical AROM Comments: forward head  Thoracic Assessment Thoracic Assessment: Exceptions to  [facility] Thoracic AROM Overall Thoracic AROM: Deficits;Due to pain Overall Thoracic AROM Comments: kyphotic, reports pain when supine Lumbar Assessment Lumbar Assessment: Exceptions to [facility] Lumbar AROM Overall Lumbar AROM: Deficits;Due to pain Overall Lumbar AROM Comments: pt reports intermittent LBP Postural Control Postural  Control: Deficits on evaluation Head Control: head held downwards - flexed Trunk Control: shoulder rounded,  Righting Reactions: decreased in standing Protective Responses: slowed and limited due to difficulty weight shifting off of UE support Postural Limitations: slouched, head held down  Balance Dynamic Sitting Balance Dynamic Sitting - Balance Support: Feet supported;Right upper extremity supported;Left upper extremity supported Dynamic Sitting - Level of Assistance: 6: Modified independent (Device/Increase time) Dynamic Standing Balance Dynamic Standing - Balance Support: Bilateral upper extremity supported;During functional activity Dynamic Standing - Level of Assistance: 4: Min assist Dynamic Standing - Balance Activities:  (LB dressing) Extremity/Trunk Assessment RUE Assessment RUE Assessment: Within Functional Limits LUE Assessment LUE Assessment: Within Functional Limits  See FIM for current functional status  Travis [name] 08/09/2014, 12:51 PM

## 2014-08-09 NOTE — Progress Notes (Signed)
[place] PHYSICAL MEDICINE & REHABILITATION     PROGRESS NOTE    Subjective/Complaints: Pt without new issues Asking if he will need insulin at home, CBG reviewed Wife coming in for family training today and tomorrow  Objective: Vital Signs: Blood pressure 141/61, pulse 50, temperature 98 F (36.7 C), temperature source Oral, resp. rate 16, height 6\' 1"  (1.854 m), weight 122 kg (268 lb 15.4 oz), SpO2 96.00%. No results found. No results found for this basename: WBC, HGB, HCT, PLT,  in the last 72 hours  Recent Labs  08/07/14 1017  NA 143  K 4.4  CL 103  GLUCOSE 104*  BUN 27*  CREATININE 1.01  CALCIUM 9.4   CBG (last 3)   Recent Labs  08/08/14 1642 08/08/14 2108 08/09/14 0652  GLUCAP 88 141* 132*    Wt Readings from Last 3 Encounters:  08/09/14 122 kg (268 lb 15.4 oz)  07/18/14 119.8 kg (264 lb 1.8 oz)  03/13/14 131.09 kg (289 lb)    Physical Exam:  Constitutional: He is oriented to person, place, and time. He appears well-developed and well-nourished. Side lying in bed  HENT: dentition fair. Oral mucosa pink/moist  Head: Normocephalic and atraumatic.  Eyes: Conjunctivae are normal. Pupils are equal, round, and reactive to light.  Neck: Normal range of motion. Neck supple.  Cardiovascular: Normal rate and regular rhythm. No murmurs  Respiratory: Effort normal and breath sounds normal. No respiratory distress. He has wheezes in upper airway.  GI: Soft. Bowel sounds are normal. He exhibits no distension. There is no tenderness.  Musculoskeletal: minimal edema in both legs.. Has low back pain with minimal lower extremity movement and with bed mobility Neurological: He is alert and oriented to person, place, and time. Appears rested    Able to follow basic commands without difficulty.  extremity: Intrinsic atrophy bilateral hands.  Motor strength is 4 minus bilateral grip 5 bilateral biceps and deltoid, 4/5 left triceps 4/5 right tricep, wrist extension 4+  3+  minus hip flexion , 3+ knee extension, 4- ankle dorsiflexion and plantarflexion   Psych: pleasant, appropriate. Skin midline 1cm sacral decub,  Assessment/Plan: 1. Functional deficits secondary to deconditioning, hx of cervical myelopathy  Stable for D/C today F/u PCP in 1-2 weeks F/u PM&R 3 weeks See D/C summary See D/C instructions   FIM: FIM - Bathing Bathing Steps Patient Completed: Chest;Right Arm;Left Arm;Abdomen;Front perineal area;Buttocks;Right upper leg;Left upper leg;Right lower leg (including foot);Left lower leg (including foot) Bathing: 5: Supervision: Safety issues/verbal cues  FIM - Upper Body Dressing/Undressing Upper body dressing/undressing steps patient completed: Thread/unthread right sleeve of pullover shirt/dresss;Thread/unthread left sleeve of pullover shirt/dress;Put head through opening of pull over shirt/dress;Pull shirt over trunk Upper body dressing/undressing: 6: More than reasonable amount of time FIM - Lower Body Dressing/Undressing Lower body dressing/undressing steps patient completed: Thread/unthread right pants leg;Thread/unthread left pants leg;Pull pants up/down;Fasten/unfasten pants Lower body dressing/undressing: 4: Min-Patient completed 75 plus % of tasks  FIM - Toileting Toileting steps completed by patient: Adjust clothing prior to toileting;Performs perineal hygiene Toileting Assistive Devices: Grab bar or rail for support Toileting: 5: Supervision: Safety issues/verbal cues  FIM - Radio producer Devices: Mining engineer Transfers: 5-To toilet/BSC: Supervision (verbal cues/safety issues);5-From toilet/BSC: Supervision (verbal cues/safety issues)  FIM - Control and instrumentation engineer Devices: Walker;Arm rests Bed/Chair Transfer: 5: Chair or W/C > Bed: Supervision (verbal cues/safety issues);5: Bed > Chair or W/C: Supervision (verbal cues/safety issues);5: Supine > Sit: Supervision  (verbal cues/safety issues);5:  Sit > Supine: Supervision (verbal cues/safety issues)  FIM - Locomotion: Wheelchair Distance: 250 Locomotion: Wheelchair: 6: Travels 150 ft or more, turns around, maneuvers to table, bed or toilet, negotiates 3% grade: maneuvers on rugs and over door sills independently FIM - Locomotion: Ambulation Locomotion: Ambulation Assistive Devices: Administrator Ambulation/Gait Assistance: 4: Min guard;4: Min assist Locomotion: Ambulation: 2: Travels 50 - 149 ft with minimal assistance (Pt.>75%)  Comprehension Comprehension Mode: Auditory Comprehension: 6-Follows complex conversation/direction: With extra time/assistive device  Expression Expression Mode: Verbal Expression: 6-Expresses complex ideas: With extra time/assistive device  Social Interaction Social Interaction: 7-Interacts appropriately with others - No medications needed.  Problem Solving Problem Solving: 6-Solves complex problems: With extra time  Memory Memory Mode: Not assessed Memory: 5-Recognizes or recalls 90% of the time/requires cueing < 10% of the time  Medical Problem List and Plan:  1. Functional deficits secondary to Cervical myelopathy with deconditioning after pneumonia requiring intubation  2. DVT Prophylaxis/Anticoagulation: Pharmaceutical: Lovenox  3. Chronic Pain Management: Off Voltren and NSAIDS.   -oxycontin stopped  -baclofen for cramps/spasms  -maintain gabapentin for neuropathic pain   -improved overall  4. Mood: Monitor as mentation improves. Will have LCSW follow for evaluation and support.  5. Neuropsych: This patient is capable of making decisions on his own behalf.  6. 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 HTN: Continue metoprolol and lasix. Cards recommendations: if needed to add norvasc and resume ACE if renal status stable.   -added 5mg  norvasc with improvement 9. Acute on chronic diastolic CHF: Daily weights. Low salt  diet. Patient non compliant at home--will have RD educate on appropriate diet.  10. Hypotension related AKI: BUN holding around 50, recheck prior to D/C 11. BPH: foley d/c'ed Off cardura and ditropan at this time. Voiding fairly well 12. DM type 2 diet controlled:  Hgb A1c-7.0. BS poorly controlled due to stress/infection requiring lantus. Will monitor BS with ac/hs checks and start patient on oral agent. amaryl now at 4mg .   -off insulin  -sugars remain acceptable 13. Insomnia with RLS: Continue Mirapex. Sleep overall improved with better bed    14. Constipation:  bowel program. 15.  Decubitus ulcer improving may d/c mattress overlay LOS (Days) 22 A FACE TO FACE EVALUATION WAS PERFORMED  Charlett Blake 08/09/2014 7:51 AM

## 2014-08-09 NOTE — Progress Notes (Signed)
Social Work  Discharge Note  The overall goal for the admission was met for:   Discharge location: Yes - Home with wife able to provide 24/7 assistance  Length of Stay: Yes - 22 days  Discharge activity level: Yes - Mod i w/c level with some supervision/ min assist needs  Home/community participation: Yes  Services provided included: MD, RD, PT, OT, SLP, RN, TR, Pharmacy, [place]: Medicare [facility])  Follow-up services arranged: Home Health: RN, PT, OT via [employer], DME: 20x18 lightweight w/c, Ulice Dash 2 cushion, rolling walker and drop arm commode via [employer] and Patient/Family has no preference for HH/DME agencies  Comments (or additional information):  Patient/Family verbalized understanding of follow-up arrangements: Yes  Individual responsible for coordination of the follow-up plan: patient  Confirmed correct DME delivered: Travis [name] 08/09/2014    Chrissa Meetze

## 2014-08-09 NOTE — Progress Notes (Signed)
Pt was discharged home with his wife. Discharge instruction, included but not limited to medication administration and follow up, was given by Algis Liming PA.

## 2014-08-15 ENCOUNTER — Telehealth: Payer: Self-pay

## 2014-08-15 NOTE — Telephone Encounter (Signed)
Do you know what medication they were talking about? Pharmacist I talked with could not find anything on the patient.

## 2014-08-15 NOTE — Telephone Encounter (Signed)
Patient's pharmacy called requesting a call back regarding medication called in at discharge.

## 2014-08-16 ENCOUNTER — Other Ambulatory Visit: Payer: Self-pay | Admitting: Physical Medicine and Rehabilitation

## 2014-08-16 ENCOUNTER — Telehealth: Payer: Self-pay

## 2014-08-16 NOTE — Telephone Encounter (Signed)
Travis [name](PT @ [place]) is requesting a verbal order to begin PT with patient for 2 x a week for 8 weeks. Verbal given.

## 2014-08-16 NOTE — Telephone Encounter (Signed)
I don't the pharmacist that left a message did not say which medications.

## 2014-08-18 NOTE — Progress Notes (Signed)
Note/chart reviewed.  Katie [name], RD, LDN Pager #: [phone] After-Hours Pager #: [phone]  

## 2014-08-18 NOTE — Plan of Care (Signed)
Note/chart reviewed.  Katie Mariusz Jubb, RD, LDN Pager #: [phone] After-Hours Pager #: [phone]  

## 2014-08-21 ENCOUNTER — Telehealth: Payer: Self-pay | Admitting: *Deleted

## 2014-08-21 NOTE — Telephone Encounter (Signed)
Pharmacy calling to request we fax or electronically send order for glucometer and glucose strips.  This was ordered by Algis Liming PA and they need the order sent to them.

## 2014-08-24 ENCOUNTER — Telehealth: Payer: Self-pay | Admitting: Physical Medicine and Rehabilitation

## 2014-08-24 DIAGNOSIS — E119 Type 2 diabetes mellitus without complications: Secondary | ICD-10-CM

## 2014-08-24 MED ORDER — ACCU-CHEK SOFT TOUCH LANCETS MISC: Status: DC

## 2014-08-24 MED ORDER — GLUCOSE BLOOD VI STRP: ORAL_STRIP | Status: DC

## 2014-08-24 NOTE — Telephone Encounter (Signed)
Spoke with pharmacy regarding ongoing issues with glucometer. They would like a script each for glucometer, strips and lancets with brand name, diagnosis code as well as instructions on how to use each item. Verbal order not accepted. Accucheck glucometer could not be e-scribed. Talked with patient and wife and three Rx mailed to  Home.

## 2014-08-25 ENCOUNTER — Ambulatory Visit: Payer: Medicare Other | Admitting: Cardiovascular Disease

## 2014-08-25 ENCOUNTER — Ambulatory Visit (INDEPENDENT_AMBULATORY_CARE_PROVIDER_SITE_OTHER): Payer: Medicare Other | Admitting: Nurse Practitioner

## 2014-08-25 ENCOUNTER — Encounter: Payer: Self-pay | Admitting: Nurse Practitioner

## 2014-08-25 VITALS — BP 140/72 | HR 48 | Ht 73.0 in | Wt 266.8 lb

## 2014-08-25 DIAGNOSIS — I10 Essential (primary) hypertension: Secondary | ICD-10-CM

## 2014-08-25 DIAGNOSIS — I5032 Chronic diastolic (congestive) heart failure: Secondary | ICD-10-CM

## 2014-08-25 LAB — BASIC METABOLIC PANEL
BUN: 21 mg/dL (ref 6–23)
CO2: 31 mEq/L (ref 19–32)
Calcium: 9.3 mg/dL (ref 8.4–10.5)
Chloride: 103 mEq/L (ref 96–112)
Creatinine, Ser: 1 mg/dL (ref 0.4–1.5)
GFR: 82.38 mL/min (ref >60.00)
Glucose, Bld: 76 mg/dL (ref 70–99)
Potassium: 3.7 mEq/L (ref 3.5–5.1)
Sodium: 140 mEq/L (ref 135–145)

## 2014-08-25 NOTE — Progress Notes (Signed)
[place] Date of Birth: 01/03/1946 Medical Record [id number]  History of Present Illness: Travis [name] is seen back today for a post hospital visit. Seen for Dr. Acie Fredrickson. He is a 68 year old male with HTN, hypothyroidism, OSA, poorly controlled type 2 DM, former smoker and DJD.  Admitted back in July with CAP/UTI with associated sepsis. Was intubated due to progressive respiratory failure. Had AKI/ATN in the setting of PNA, hypoxic RF and septic shock. Felt to have some degree of diastolic HF - echo was not of good quality. Apparently has had past high sodium diet and uncontrolled HTN. Looks like he was on Lisinopril HCT, cardura and a lower dose of beta blocker. Mildly elevated troponin but no anginal symptoms and this was suspected to be from respiratory failure/CAP with septic shock.   Comes back today. Here with his wife and his daughter. He is getting stronger. Has been home about 2 weeks. No lab done since discharge. Breathing is good. He is not dizzy or lightheaded. No passing out. Does not like to elevate his legs due to his restless leg syndrome. No chest pain. BP ok for the most part at home. Swelling has improved - does go down overnight. No cough. Walking more.   Current Outpatient Prescriptions  Medication Sig Dispense Refill  . acetaminophen (TYLENOL) 325 MG tablet Take 650 mg by mouth as needed for mild pain.      [name] ALPRAZolam (XANAX) 0.5 MG tablet Take 0.25 mg by mouth at bedtime.      [name] amLODipine (NORVASC) 5 MG tablet Take 1 tablet (5 mg total) by mouth daily.  30 tablet  1  . baclofen (LIORESAL) 10 MG tablet Take 1 tablet (10 mg total) by mouth 3 (three) times daily. For muscle spasms  90 each  1  . escitalopram (LEXAPRO) 5 MG tablet Take 1 tablet (5 mg total) by mouth at bedtime.  30 tablet  1  . furosemide (LASIX) 40 MG tablet Take a whole pill with breakfast and take 1/2 a pill after lunch  45 tablet  1  . gabapentin (NEURONTIN) 400 MG capsule Take 1 capsule (400 mg  total) by mouth every 8 (eight) hours.  90 capsule  1  . glimepiride (AMARYL) 4 MG tablet Take 1 tablet (4 mg total) by mouth daily with breakfast. For diabetes  30 tablet  1  . levothyroxine (SYNTHROID, LEVOTHROID) 150 MCG tablet Take 150 mcg by mouth daily.      . metoprolol succinate (TOPROL-XL) 50 MG 24 hr tablet Take 1 tablet (50 mg total) by mouth daily.  30 tablet  1  . oxybutynin (DITROPAN) 5 MG tablet Take 0.5 tablets (2.5 mg total) by mouth 2 (two) times daily.  30 tablet  1  . polyethylene glycol (MIRALAX / GLYCOLAX) packet Take 17 g by mouth daily as needed. For constipation  14 each  0  . pramipexole (MIRAPEX) 0.5 MG tablet Take 0.125 mg by mouth 2 (two) times daily.       No current facility-administered medications for this visit.    Allergies  Allergen Reactions  . Other Other (See Comments)    Anesthesia makes sick, must have antinausea medication in advance.    Past Medical History  Diagnosis Date  . PONV (postoperative nausea and vomiting)   . Hypertension   . DJD (degenerative joint disease)     WHOLE SPINE  . Hyperthyroidism     Had Iodine to resolve issue DX 10 YR AGO  .  Hypothyroidism     Low d/t treatment of hyperthyroidism  . Sleep apnea     DX  2009/2010 [facility] done at [facility]  . Type II diabetes mellitus     diet controlled  . Heart murmur     Evaluated by cardiologist 4-5 years ago but does not currently see a cardiologist  . Gait disorder   . Cervical myelopathy     with myelomalacia at C5-6  . RLS (restless legs syndrome)   . Peripheral neuropathy   . 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 Constipation - functional     controlled with miralax  . RBBB     Past Surgical History  Procedure Laterality Date  . Cholecystectomy    . Back surgery      6 surgeries  . Lumbar laminectomy/decompression microdiscectomy  07/14/2012    Procedure: LUMBAR LAMINECTOMY/DECOMPRESSION MICRODISCECTOMY 2 LEVELS;  Surgeon: Eustace Moore, MD;  Location: [place] NEURO  ORS;  Service: Neurosurgery;  Laterality: Left;  Lumbar two three laminectomy, left thoracic four five transpedicular diskectomy    History  Smoking status  . Former Smoker -- 2.00 packs/day for 20 years  . Types: Cigarettes  . Quit date: 06/28/1985  Smokeless tobacco  . Former Systems developer  . Quit date: 06/28/1985    History  Alcohol Use No    Family History  Problem Relation Age of Onset  . Polycythemia Mother   . Diabetes Father   . Hypertension Father   . Heart disease Father   . CAD      1 of his 4 brothers and father has CAD    Review of Systems: The review of systems is per the HPI.  All other systems were reviewed and are negative.  Physical Exam: BP 140/72  Pulse 48  Ht 6\' 1"  (1.854 m)  Wt 266 lb 12.8 oz (121.02 kg)  BMI 35.21 kg/m2 Patient is very pleasant and in no acute distress. He previously weighed 290 pounds. Skin is warm and dry. Color is normal.  HEENT is unremarkable. Normocephalic/atraumatic. PERRL. Sclera are nonicteric. Neck is supple. No masses. No JVD. Lungs are clear. Cardiac exam shows a regular rate and rhythm. HR is 48 by me. Abdomen is soft. Extremities are with trace edema -  hehas support stockings in place. Gait and ROM are intact. Using a walker. No gross neurologic deficits noted.  Wt Readings from Last 3 Encounters:  08/25/14 266 lb 12.8 oz (121.02 kg)  08/09/14 268 lb 15.4 oz (122 kg)  07/18/14 264 lb 1.8 oz (119.8 kg)    LABORATORY DATA/PROCEDURES:  Lab Results  Component Value Date   WBC 7.3 07/19/2014   HGB 12.7* 07/19/2014   HCT 39.8 07/19/2014   PLT 316 07/19/2014   GLUCOSE 104* 08/07/2014   TRIG 145 07/18/2014   ALT 73* 07/19/2014   AST 44* 07/19/2014   NA 143 08/07/2014   K 4.4 08/07/2014   CL 103 08/07/2014   CREATININE 1.01 08/07/2014   BUN 27* 08/07/2014   CO2 28 08/07/2014   INR 1.03 07/14/2012   HGBA1C 7.0* 07/10/2014    BNP (last 3 results)  Recent Labs  07/10/14 1255 07/10/14 2105  PROBNP 142.2* 1750.0*   Echo Study  Conclusions from July 2015  - Left ventricle: The cavity size was normal. Wall thickness was normal. Systolic function was normal. The estimated ejection fraction was in the range of 60% to 65%.   Assessment / Plan: 1. Probable diastolic HF - even though echo was  not of good quality - looks compensated. I have left him on his current regimen. Encouraged continued salt restriction, elevate legs and support stockings. Doubt his dose of Norvasc is causing the swelling but we need to remember that this is a new medicine for him.   2. CAP/UTI with septic shock/respiratory failure - required intubation -  Getting stronger  3. HTN - BP ok - they will continue to monitor.   4. Bradycardia - he is asymptomatic. He is on higher dose of Toprol than before. Will check a BMET today. Would hope to try and restart his ACE if lab looks ok and then cut this back - but he is not symptomatic.  He is seeing Dr. Acie Fredrickson in about 2 weeks. Will bring his readings in for review and we will be able to adjust medicines as needed since his kidney function will be known.  Patient is agreeable to this plan and will call if any problems develop in the interim.   Burtis Junes, RN, [place] [address] [place], [place]  [address] [phone]

## 2014-08-25 NOTE — Patient Instructions (Signed)
We will check lab today  Keep wearing your support stockings - knee high are ok  Keep restricting your salt, elevating your legs as you can, etc.  Increase your activity and getting stronger  See Dr. Acie Fredrickson as planned  Keep monitoring your blood pressure  Call the [place] office at [phone] if you have any questions, problems or concerns.

## 2014-09-06 DIAGNOSIS — Z8701 Personal history of pneumonia (recurrent): Secondary | ICD-10-CM

## 2014-09-06 DIAGNOSIS — E119 Type 2 diabetes mellitus without complications: Secondary | ICD-10-CM

## 2014-09-06 DIAGNOSIS — M4712 Other spondylosis with myelopathy, cervical region: Secondary | ICD-10-CM

## 2014-09-06 DIAGNOSIS — I10 Essential (primary) hypertension: Secondary | ICD-10-CM

## 2014-09-06 DIAGNOSIS — M4714 Other spondylosis with myelopathy, thoracic region: Secondary | ICD-10-CM

## 2014-09-06 DIAGNOSIS — I5033 Acute on chronic diastolic (congestive) heart failure: Secondary | ICD-10-CM

## 2014-09-07 ENCOUNTER — Encounter: Payer: Self-pay | Admitting: Cardiovascular Disease

## 2014-09-07 ENCOUNTER — Ambulatory Visit: Payer: Medicare Other | Admitting: Cardiovascular Disease

## 2014-09-07 ENCOUNTER — Ambulatory Visit (INDEPENDENT_AMBULATORY_CARE_PROVIDER_SITE_OTHER): Payer: Medicare Other | Admitting: Cardiovascular Disease

## 2014-09-07 VITALS — BP 126/66 | HR 44 | Ht 73.0 in | Wt 265.8 lb

## 2014-09-07 DIAGNOSIS — I5031 Acute diastolic (congestive) heart failure: Secondary | ICD-10-CM

## 2014-09-07 MED ORDER — POTASSIUM CHLORIDE ER 10 MEQ PO TBCR: 10.0000 meq | EXTENDED_RELEASE_TABLET | Freq: Every day | ORAL | Status: DC

## 2014-09-07 NOTE — Progress Notes (Signed)
[place] Date of Birth: [birth year] Medical Record [id number]  History of Present Illness: Travis [name] is seen back today for a post hospital visit.  He is the father - in -law of Travis [name] (CCU nurse) and Travis [name] ([place])    I saw him in consultatoin on July 17, 2014 for issues related to his diastolic dysfunction in the setting of pneumonia /  respiratory failure  He was seen By Truitt Merle in late August for follow up of his hospitalization.   [name] He is a 68 year old male with HTN, hypothyroidism, OSA, poorly controlled type 2 DM, former smoker and DJD.  Admitted back in July with CAP/UTI with associated sepsis. Was intubated due to progressive respiratory failure. Had AKI/ATN in the setting of PNA, hypoxic RF and septic shock. Felt to have some degree of diastolic HF - echo was not of good quality. Apparently has had past high sodium diet and uncontrolled HTN. Looks like he was on Lisinopril HCT, cardura and a lower dose of beta blocker. Mildly elevated troponin but no anginal symptoms and this was suspected to be from respiratory failure/CAP with septic shock.   Was seen by Cecille Rubin on August 28,  Reported getting stronger,  Breathing was ok.  Leg edema has improved.    Sept. 9, 2015:  [name] is seen today with his wife. Walking with a walker.  Getting stronger every day.  Has cut out his high salt foods.   He i still working Administrator, sports estate)  Has been seen by home health nurses - HR has been slow.  No episodes of syncope.   Current Outpatient Prescriptions  Medication Sig Dispense Refill  . acetaminophen (TYLENOL) 325 MG tablet Take 650 mg by mouth as needed for mild pain.      . baclofen (LIORESAL) 10 MG tablet Take 1 tablet (10 mg total) by mouth 3 (three) times daily. For muscle spasms  90 each  1  . ciclopirox (PENLAC) 8 % solution Apply topically at bedtime.       . diclofenac (VOLTAREN) 75 MG EC tablet Take 75 mg by mouth 2 (two) times daily.       [name]  escitalopram (LEXAPRO) 5 MG tablet Take 1 tablet (5 mg total) by mouth at bedtime.  30 tablet  1  . furosemide (LASIX) 40 MG tablet Take a whole pill with breakfast and take 1/2 a pill after lunch  45 tablet  1  . gabapentin (NEURONTIN) 400 MG capsule Take 1 capsule (400 mg total) by mouth every 8 (eight) hours.  90 capsule  1  . glimepiride (AMARYL) 4 MG tablet Take 1 tablet (4 mg total) by mouth daily with breakfast. For diabetes  30 tablet  1  . levothyroxine (SYNTHROID, LEVOTHROID) 150 MCG tablet Take 150 mcg by mouth daily.      [name] lisinopril-hydrochlorothiazide (PRINZIDE,ZESTORETIC) 20-12.5 MG per tablet       . metoprolol succinate (TOPROL-XL) 50 MG 24 hr tablet Take 1 tablet (50 mg total) by mouth daily.  30 tablet  1  . oxybutynin (DITROPAN) 5 MG tablet Take 0.5 tablets (2.5 mg total) by mouth 2 (two) times daily.  30 tablet  1  . polyethylene glycol (MIRALAX / GLYCOLAX) packet Take 17 g by mouth daily as needed. For constipation  14 each  0  . pramipexole (MIRAPEX) 0.5 MG tablet Take 0.125 mg by mouth 2 (two) times daily.       No  current facility-administered medications for this visit.    Allergies  Allergen Reactions  . Other Other (See Comments)    Anesthesia makes sick, must have antinausea medication in advance.    Past Medical History  Diagnosis Date  . PONV (postoperative nausea and vomiting)   . Hypertension   . DJD (degenerative joint disease)     WHOLE SPINE  . Hyperthyroidism     Had Iodine to resolve issue DX 10 YR AGO  . Hypothyroidism     Low d/t treatment of hyperthyroidism  . Sleep apnea     DX  2009/2010 study done at [facility]  . Type II diabetes mellitus     diet controlled  . Heart murmur     Evaluated by cardiologist 4-5 years ago but does not currently see a cardiologist  . Gait disorder   . Cervical myelopathy     with myelomalacia at C5-6  . RLS (restless legs syndrome)   . Peripheral neuropathy   . 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  Constipation - functional     controlled with miralax  . RBBB     Past Surgical History  Procedure Laterality Date  . Cholecystectomy    . Back surgery      6 surgeries  . Lumbar laminectomy/decompression microdiscectomy  07/14/2012    Procedure: LUMBAR LAMINECTOMY/DECOMPRESSION MICRODISCECTOMY 2 LEVELS;  Surgeon: Eustace Moore, MD;  Location: [place] NEURO ORS;  Service: Neurosurgery;  Laterality: Left;  Lumbar two three laminectomy, left thoracic four five transpedicular diskectomy    History  Smoking status  . Former Smoker -- 2.00 packs/day for 20 years  . Types: Cigarettes  . Quit date: 06/28/1985  Smokeless tobacco  . Former Systems developer  . Quit date: 06/28/1985    History  Alcohol Use No    Family History  Problem Relation Age of Onset  . Polycythemia Mother   . Diabetes Father   . Hypertension Father   . Heart disease Father   . CAD      1 of his 4 brothers and father has CAD    Review of Systems: The review of systems is per the HPI.  All other systems were reviewed and are negative.  Physical Exam: BP 126/66  Pulse 44  Ht 6\' 1"  (1.854 m)  Wt 265 lb 12.8 oz (120.566 kg)  BMI 35.08 kg/m2 Patient is very pleasant and in no acute distress. He previously weighed 290 pounds.  Skin is warm and dry. Color is normal.   HEENT is unremarkable. Normocephalic/atraumatic. PERRL. Sclera are nonicteric. Neck is supple. No masses. No JVD.  Lungs are clear.  Cardiac exam shows a regular rate and rhythm. HR is 48   Abdomen is soft. Extremities are with 1+  edema -  He has support stockings in place.  Gait is very slow.  Walks with a walker . No gross neurologic deficits noted.  Wt Readings from Last 3 Encounters:  09/07/14 265 lb 12.8 oz (120.566 kg)  08/25/14 266 lb 12.8 oz (121.02 kg)  08/09/14 268 lb 15.4 oz (122 kg)    LABORATORY DATA/PROCEDURES:  Lab Results  Component Value Date   WBC 7.3 07/19/2014   HGB 12.7* 07/19/2014   HCT 39.8 07/19/2014   PLT 316 07/19/2014    GLUCOSE 76 08/25/2014   TRIG 145 07/18/2014   ALT 73* 07/19/2014   AST 44* 07/19/2014   NA 140 08/25/2014   K 3.7 08/25/2014   CL 103 08/25/2014   CREATININE 1.0  08/25/2014   BUN 21 08/25/2014   CO2 31 08/25/2014   INR 1.03 07/14/2012   HGBA1C 7.0* 07/10/2014    BNP (last 3 results)  Recent Labs  07/10/14 1255 07/10/14 2105  PROBNP 142.2* 1750.0*   Echo Study Conclusions from July 2015  - Left ventricle: The cavity size was normal. Wall thickness was normal. Systolic function was normal. The estimated ejection fraction was in the range of 60% to 65%.   Assessment / Plan:

## 2014-09-07 NOTE — Assessment & Plan Note (Signed)
Travis [name] is seen today for followup of his acute congestive heart failure. He's made significant progress. He's cut out a lot of salty foods. His blood pressure has been better controlled.  He is making slow progress. His heart rate remains slow. We will stop the Toprol-XL.  His potassium level drawn several weeks ago was on the low side. We will at potassium chloride 10 mEq a day. He'll have a followup blood work with Dr. Marisue Humble.  I will see him in 3 months.

## 2014-09-07 NOTE — Patient Instructions (Addendum)
Your physician has recommended you make the following change in your medication:   1. Stop Toprol XL.  2. Start K-dur 10 meq 1 tablet daily.    Please have furture labs faxed to Dr. Acie Fredrickson.  Your physician recommends that you schedule a follow-up appointment in: 3 months with Dr. Acie Fredrickson.

## 2014-09-13 ENCOUNTER — Encounter: Payer: Self-pay | Admitting: Neurology

## 2014-09-13 ENCOUNTER — Telehealth: Payer: Self-pay | Admitting: Physical Medicine & Rehabilitation

## 2014-09-13 ENCOUNTER — Ambulatory Visit (INDEPENDENT_AMBULATORY_CARE_PROVIDER_SITE_OTHER): Payer: Medicare Other | Admitting: Neurology

## 2014-09-13 VITALS — BP 167/76 | HR 51 | Ht 73.0 in | Wt 260.0 lb

## 2014-09-13 DIAGNOSIS — M4712 Other spondylosis with myelopathy, cervical region: Secondary | ICD-10-CM

## 2014-09-13 DIAGNOSIS — R269 Unspecified abnormalities of gait and mobility: Secondary | ICD-10-CM

## 2014-09-13 DIAGNOSIS — M4807 Spinal stenosis, lumbosacral region: Secondary | ICD-10-CM

## 2014-09-13 DIAGNOSIS — M4714 Other spondylosis with myelopathy, thoracic region: Secondary | ICD-10-CM | POA: Insufficient documentation

## 2014-09-13 DIAGNOSIS — M48061 Spinal stenosis, lumbar region without neurogenic claudication: Secondary | ICD-10-CM

## 2014-09-13 NOTE — Progress Notes (Signed)
Reason for visit: Cervical and thoracic myelopathy  Travis [name] is an 68 y.o. male  History of present illness:  Travis [name] is a 68 year old right-handed white male with a history of multilevel spine disease. The patient has lumbosacral spinal stenosis, and he has had spinal cord compression in the thoracic and cervical area with associated chronic myelopathy. The patient was recently in the hospital in August of 2015 with a pneumonia, and a urinary tract infection. The patient required intubation, and he has had a 30 pound weight loss and associated generalized weakness and fatigue, and he has had a decrease in his ability to ambulate. The patient is in home health physical therapy after a period time in an extended care facility for rehabilitation. The patient is progressing, but he is still using a walker rather than a cane for ambulation. He has had one fall during physical therapy, but no other falls have been noted. The patient denies any change in bladder function. He indicates that he did have renal impairment associated with a urinary tract infection, but he did not require hemodialysis. The does report some spasticity in the legs, but this is not severe. He was taken off of amlodipine secondary to swelling in the lower extremities, and this has been beneficial. He returns for an evaluation.  Past Medical History  Diagnosis Date  . PONV (postoperative nausea and vomiting)   . Hypertension   . DJD (degenerative joint disease)     WHOLE SPINE  . Hyperthyroidism     Had Iodine to resolve issue DX 10 YR AGO  . Hypothyroidism     Low d/t treatment of hyperthyroidism  . Sleep apnea     DX  2009/2010 study done at [facility]  . Type II diabetes mellitus     diet controlled  . Heart murmur     Evaluated by cardiologist 4-5 years ago but does not currently see a cardiologist  . Gait disorder   . Cervical myelopathy     with myelomalacia at C5-6 and C3-4  . RLS (restless legs syndrome)     . Peripheral neuropathy   . 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 Constipation - functional     controlled with miralax  . RBBB   . 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 Cholecystectomy    . Back surgery      6 surgeries  . Lumbar laminectomy/decompression microdiscectomy  07/14/2012    Procedure: LUMBAR LAMINECTOMY/DECOMPRESSION MICRODISCECTOMY 2 LEVELS;  Surgeon: Eustace Moore, MD;  Location: [place] [facility];  Service: Neurosurgery;  Laterality: Left;  Lumbar two three laminectomy, left thoracic four five transpedicular diskectomy    Family History  Problem Relation Age of Onset  . Polycythemia Mother   . Diabetes Father   . Hypertension Father   . Heart disease Father   . CAD      1 of his 4 brothers and father has CAD    Social history:  reports that he quit smoking about 29 years ago. His smoking use included Cigarettes. He has a 40 pack-year smoking history. He quit smokeless tobacco use about 29 years ago. He reports that he does not drink alcohol or use illicit drugs.    Allergies  Allergen Reactions  . Other Other (See Comments)    Anesthesia makes sick, must have antinausea medication in advance.    Medications:  Current Outpatient Prescriptions on  File Prior to Visit  Medication Sig Dispense Refill  . acetaminophen (TYLENOL) 325 MG tablet Take 650 mg by mouth as needed for mild pain.      . baclofen (LIORESAL) 10 MG tablet Take 1 tablet (10 mg total) by mouth 3 (three) times daily. For muscle spasms  90 each  1  . ciclopirox (PENLAC) 8 % solution Apply topically at bedtime.       . diclofenac (VOLTAREN) 75 MG EC tablet Take 75 mg by mouth 2 (two) times daily.       [name] escitalopram (LEXAPRO) 5 MG tablet Take 1 tablet (5 mg total) by mouth at bedtime.  30 tablet  1  . furosemide (LASIX) 40 MG tablet Take a whole pill with breakfast and take 1/2 a pill after lunch  45 tablet  1  . gabapentin (NEURONTIN) 400  MG capsule Take 1 capsule (400 mg total) by mouth every 8 (eight) hours.  90 capsule  1  . glimepiride (AMARYL) 4 MG tablet Take 1 tablet (4 mg total) by mouth daily with breakfast. For diabetes  30 tablet  1  . levothyroxine (SYNTHROID, LEVOTHROID) 150 MCG tablet Take 150 mcg by mouth daily.      [name] lisinopril-hydrochlorothiazide (PRINZIDE,ZESTORETIC) 20-12.5 MG per tablet       . oxybutynin (DITROPAN) 5 MG tablet Take 0.5 tablets (2.5 mg total) by mouth 2 (two) times daily.  30 tablet  1  . polyethylene glycol (MIRALAX / GLYCOLAX) packet Take 17 g by mouth daily as needed. For constipation  14 each  0  . potassium chloride (K-DUR) 10 MEQ tablet Take 1 tablet (10 mEq total) by mouth daily.  30 tablet  6  . pramipexole (MIRAPEX) 0.5 MG tablet Take 0.125 mg by mouth 2 (two) times daily.       No current facility-administered medications on file prior to visit.    ROS:  Out of a complete 14 system review of symptoms, the patient complains only of the following symptoms, and all other reviewed systems are negative.  Leg swelling Restless legs Gait disorder  Blood pressure 167/76, pulse 51, height 6\' 1"  (1.854 m), weight 260 lb (117.935 kg).  Physical Exam  General: The patient is alert and cooperative at the time of the examination.  Skin: 2+ edema in the lower extremities is noted bilaterally.   Neurologic Exam  Mental status: The patient is oriented x 3.  Cranial nerves: Facial symmetry is present. Speech is normal, no aphasia or dysarthria is noted. Extraocular movements are full. Visual fields are full.  Motor: The patient has good strength in the upper extremities, with exception that there is 4/5 strength of the intrinsic muscles of the hands bilaterally. With the lower extremities, there is 3/5 strength with hip flexion bilaterally, 4+/5 strength with knee extension and flexion, and dorsiflexion of the feet. Some increased motor tone of the lower extremities is noted.  Sensory  examination: Soft touch sensation is symmetric on the face, arms, and legs.  Coordination: The patient has good finger-nose-finger bilaterally. The patient has difficulty performing heel-to-shin bilaterally.  Gait and station: The patient has a wide-based, diplegia gait, he walks with a walker. Tandem gait was not attempted. Romberg is negative.  Reflexes: Deep tendon reflexes are symmetric.    MRI thoracic 07/06/12:  IMPRESSION:  1. No significant change in the multilevel disc protrusions.  Focal slight myelopathy at T4-5 and at T1-2, unchanged.  2. Interval posterior fusion at T7-8, T8-9, and T9-10.  MRI lumbar 07/06/12:  IMPRESSION:  1. Interval progression of congenital and acquired central canal  and lateral recess stenosis at L2-3 which appears moderately severe  to severe. Left worse than right foraminal narrowing at this level  again noted.  2. No change in a disc bulge and superimposed downturning left  lateral recess protrusion at L3-4. Left worse than right foraminal  and lateral recess stenosis persists.  3. No change in a central protrusion at L5-S1 causing mild to  moderate lateral recess narrowing, more notable on the left.     Assessment/Plan:  1. Cervical myelopathy, C3-4 and C5-6 levels  2. Thoracic myelopathy, T1-2 and T4-5 levels  3. Lumbosacral spinal stenosis L2-3 level  The patient has had a clear setback in his physical abilities following pneumonia and urinary sepsis. The patient is in physical therapy, and he believes that he is ready to transition from home health therapy to outpatient physical therapy. I will try to get this set up for him. The patient will continue with his current medications, and he will followup in about 6 months. He will contact our office if any concerns arise.  Jill Alexanders MD 09/13/2014 9:15 AM  [facility] [address] [place], [place] [address]  Phone [phone] Fax  [phone]

## 2014-09-13 NOTE — Patient Instructions (Signed)

## 2014-09-13 NOTE — Telephone Encounter (Signed)
Shebly with [facility] wanted to let Dr. Naaman Plummer know patient had a fall last week, his knee gave out on him.  Any questions please call her.

## 2014-09-15 NOTE — Telephone Encounter (Signed)
Cara(RN @ [place]) called to inform Dr. Naaman Plummer that the patient's nurse visits have been extended 1x a week for 4 weeks.

## 2014-09-18 ENCOUNTER — Encounter: Payer: Medicare Other | Attending: Physical Medicine & Rehabilitation | Admitting: Physical Medicine & Rehabilitation

## 2014-09-18 ENCOUNTER — Encounter: Payer: Self-pay | Admitting: Physical Medicine & Rehabilitation

## 2014-09-18 VITALS — BP 150/52 | HR 44 | Resp 14 | Ht 73.0 in | Wt 256.4 lb

## 2014-09-18 DIAGNOSIS — G609 Hereditary and idiopathic neuropathy, unspecified: Secondary | ICD-10-CM | POA: Diagnosis not present

## 2014-09-18 DIAGNOSIS — M4807 Spinal stenosis, lumbosacral region: Secondary | ICD-10-CM

## 2014-09-18 DIAGNOSIS — R5381 Other malaise: Secondary | ICD-10-CM | POA: Diagnosis not present

## 2014-09-18 DIAGNOSIS — G8929 Other chronic pain: Secondary | ICD-10-CM | POA: Insufficient documentation

## 2014-09-18 DIAGNOSIS — M48061 Spinal stenosis, lumbar region without neurogenic claudication: Secondary | ICD-10-CM | POA: Diagnosis not present

## 2014-09-18 DIAGNOSIS — G63 Polyneuropathy in diseases classified elsewhere: Secondary | ICD-10-CM

## 2014-09-18 DIAGNOSIS — M4712 Other spondylosis with myelopathy, cervical region: Secondary | ICD-10-CM | POA: Diagnosis not present

## 2014-09-18 NOTE — Progress Notes (Signed)
Subjective:    Patient ID: Travis [name], male    DOB: Apr 17, 1946, 68 y.o.   MRN: [id number]  HPI  Eligah is back regarding his cervical myelpathy. He has been working on his gait and lower extremity strength. He has one fall with therapy when his right knee gave out while marching. He occasionally will walk with his cane at home.  He denies any sleep issues. Wife thinks he's a little irritable. The patient denies.   His family doctor told him his renal function was off, likely from medications and his lisinopril/hctz and metoprolol were stopped. His wife has noticed that his bp has been up since then. Pt incidentally complains of dry mouth which is quite annoying.   He has gone back to work a few hours per week.    Pain Inventory Average Pain 0 Pain Right Now 0 My pain is NO PAIN  In the last 24 hours, has pain interfered with the following? General activity 2 Relation with others 10 Enjoyment of life 8 What TIME of day is your pain at its worst? evening Sleep (in general) Fair  Pain is worse with: walking Pain improves with: NO PAIN Relief from Meds: 0  Mobility use a walker  Function retired  Neuro/Psych weakness, NUMBNESS  Prior Studies Any changes since last visit?  yes  Physicians involved in your care NO CHANGES   Family History  Problem Relation Age of Onset  . Polycythemia Mother   . Diabetes Father   . Hypertension Father   . Heart disease Father   . CAD      1 of his 4 brothers and father has CAD   History   Social History  . Marital Status: Married    Spouse Name: [name]    Number of Children: 3  . Years of Education: 16   Occupational History  .      Works in [place]  . Smoking status: Former Smoker -- 2.00 packs/day for 20 years    Types: Cigarettes    Quit date: 06/28/1985  . Smokeless tobacco: Former Systems developer    Quit date: 06/28/1985  . Alcohol Use: No  . Drug Use: No  . Sexual Activity:  None   Other Topics Concern  . None   Social History Narrative   Patient is married with 3 children.   Patient has 16 yrs of education.         Past Surgical History  Procedure Laterality Date  . Cholecystectomy    . Back surgery      6 surgeries  . Lumbar laminectomy/decompression microdiscectomy  07/14/2012    Procedure: LUMBAR LAMINECTOMY/DECOMPRESSION MICRODISCECTOMY 2 LEVELS;  Surgeon: Eustace Moore, MD;  Location: [place] [facility];  Service: Neurosurgery;  Laterality: Left;  Lumbar two three laminectomy, left thoracic four five transpedicular diskectomy   Past Medical History  Diagnosis Date  . PONV (postoperative nausea and vomiting)   . Hypertension   . DJD (degenerative joint disease)     WHOLE SPINE  . Hyperthyroidism     Had Iodine to resolve issue DX 10 YR AGO  . Hypothyroidism     Low d/t treatment of hyperthyroidism  . Sleep apnea     DX  2009/2010 study done at [facility]  . Type II diabetes mellitus     diet controlled  . Heart murmur     Evaluated by cardiologist 4-5 years ago but does not currently see a  cardiologist  . Gait disorder   . Cervical myelopathy     with myelomalacia at C5-6 and C3-4  . RLS (restless legs syndrome)   . Peripheral neuropathy   . 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 Constipation - functional     controlled with miralax  . RBBB   . 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 CHF (congestive heart failure)   . Pneumonia    BP 150/52  Pulse 44  Resp 14  Ht 6\' 1"  (1.854 m)  Wt 256 lb 6.4 oz (116.302 kg)  BMI 33.84 kg/m2  SpO2 95%  Opioid Risk Score:   Fall Risk Score: Moderate Fall Risk (6-13 points)  Review of Systems     Objective:   Physical Exam  Constitutional: He is oriented to person, place, and time.  HENT: dentition fair. Oral mucosa pink/moist  Head: Normocephalic and atraumatic.  Eyes: Conjunctivae are normal. Pupils are equal, round, and reactive to light.  Neck: Normal range of motion. Neck supple.    Cardiovascular: Normal rate and regular rhythm. No murmurs  Respiratory: Effort normal and breath sounds normal. No respiratory distress. He has wheezes in upper airway.  GI: Soft. Bowel sounds are normal. He exhibits no distension. There is no tenderness.  Musculoskeletal: minimal edema in both legs.. Has low back pain  Neurological: He is alert and oriented to person, place, and time. Appears rested   CN exam normal.   Intrinsic atrophy bilateral hands UE: 5/5. LE: HF 4- to 4/5. RKE 4-, LKE 4, ankles 4 to 4+/5. Sensory deficits to propiocetpion and light touch in both legs. He does scissor a bit when he walks, drags his toes during swing and knees tend to sway and flex with weight bearing Skin: chronic changes on feet/ dry    Psych: pleasant, appropriate. Improved insight   Assessment/Plan:  1. Functional deficits secondary to Cervical myelopathy with deconditioning after pneumonia - continue with therapy but transition to outpt therapies -want him to use his walker at all times 2. Chronic Pain Management: hold Voltaren due to recent "kidney issues" unless otherwise instructed by PCP  -baclofen for cramps/spasms  -maintain gabapentin for neuropathic pain  -improved overall  -discussed safety and realistic goals 4. Mood: improved overall 5. Urology: hold ditropan for dry mouth.  Follow up with me in about 3 months. Thirty minutes of face to face patient care time were spent during this visit. All questions were encouraged and answered.

## 2014-09-18 NOTE — Patient Instructions (Signed)
STOP THE VOLTAREN AND DITROPAN.

## 2014-09-21 ENCOUNTER — Encounter: Payer: Self-pay | Admitting: Cardiovascular Disease

## 2014-09-21 ENCOUNTER — Ambulatory Visit (INDEPENDENT_AMBULATORY_CARE_PROVIDER_SITE_OTHER): Payer: Medicare Other | Admitting: Cardiovascular Disease

## 2014-09-21 ENCOUNTER — Telehealth: Payer: Self-pay | Admitting: Cardiovascular Disease

## 2014-09-21 VITALS — BP 128/62 | HR 49 | Ht 73.0 in | Wt 264.0 lb

## 2014-09-21 DIAGNOSIS — I10 Essential (primary) hypertension: Secondary | ICD-10-CM

## 2014-09-21 DIAGNOSIS — R001 Bradycardia, unspecified: Secondary | ICD-10-CM | POA: Insufficient documentation

## 2014-09-21 DIAGNOSIS — I5031 Acute diastolic (congestive) heart failure: Secondary | ICD-10-CM

## 2014-09-21 DIAGNOSIS — I498 Other specified cardiac arrhythmias: Secondary | ICD-10-CM

## 2014-09-21 LAB — TSH: TSH: 2.17 u[IU]/mL (ref 0.35–4.50)

## 2014-09-21 NOTE — Telephone Encounter (Signed)
Spoke with patient to confirm appointment today at 2:30 with Dr. Acie Fredrickson for evaluation of near-syncope episodes and bradycardia.  Patient verbalized understanding and agreement.

## 2014-09-21 NOTE — Patient Instructions (Signed)
Your physician recommends that you have labs drawn today: TSH  Your physician recommends that you follow-up as scheduled.

## 2014-09-21 NOTE — Telephone Encounter (Signed)
Spoke with Arbie Cookey, [facility] with [facility] who reports patient's heart rate remains in the 40's bpm range despite stopping Metoprolol 2 weeks ago.  Vital signs yesterday:  BP 148/70, HR 47 bpm.  Arbie Cookey reports patient's lungs clear upon auscultation and pt denies c/o chest discomfort.  Arbie Cookey states patient reports feeling like he is going to pass out when having BM.  Arbie Cookey advised patient to start a stool softener.  Arbie Cookey states she believes patient needs to be evaluated today for possible heart block.  I advised that I will discuss with Dr. Acie Fredrickson and call back with his advice.  Arbie Cookey verbalized understanding and agreement.

## 2014-09-21 NOTE — Progress Notes (Signed)
[place] Date of Birth: 29-Oct-1946 Medical Record [id number]  History of Present Illness: [name] is seen back today for a post hospital visit.  He is the father - in -law of Travis [name] ([employer] nurse) and Travis [name] ([place])    I saw him in consultatoin on July 17, 2014 for issues related to his diastolic dysfunction in the setting of pneumonia /  respiratory failure  He was seen By Truitt Merle in late August for follow up of his hospitalization.   [name] He is a 68 year old male with HTN, hypothyroidism, OSA, poorly controlled type 2 DM, former smoker and DJD.  Admitted back in July with CAP/UTI with associated sepsis. Was intubated due to progressive respiratory failure. Had AKI/ATN in the setting of PNA, hypoxic RF and septic shock. Felt to have some degree of diastolic HF - echo was not of good quality. Apparently has had past high sodium diet and uncontrolled HTN. Looks like he was on Lisinopril HCT, cardura and a lower dose of beta blocker. Mildly elevated troponin but no anginal symptoms and this was suspected to be from respiratory failure/CAP with septic shock.   Was seen by Cecille Rubin on August 28,  Reported getting stronger,  Breathing was ok.  Leg edema has improved.    Sept. 9, 2015:  [name] is seen today with his wife. Walking with a walker.  Getting stronger every day.  Has cut out his high salt foods.   He i still working Administrator, sports estate)  Has been seen by home health nurses - HR has been slow.  No episodes of syncope.   Sept. 24, 2015:  [name] is doing OK but continues to gradually gained strength. His home health nurse recently noted that his heart rate was very slow. Occasionally have some episodes of very mild lightheadedness when he sat on the toilet type of bowel movement. He has not had episodes of syncope and seems to be doing pretty well at other times.  He  Has not had any syncope.   Current Outpatient Prescriptions  Medication Sig Dispense Refill    . acetaminophen (TYLENOL) 325 MG tablet Take 650 mg by mouth as needed for mild pain.      . baclofen (LIORESAL) 10 MG tablet Take 1 tablet (10 mg total) by mouth 3 (three) times daily. For muscle spasms  90 each  1  . ciclopirox (PENLAC) 8 % solution Apply topically at bedtime.       [name] escitalopram (LEXAPRO) 5 MG tablet Take 1 tablet (5 mg total) by mouth at bedtime.  30 tablet  1  . furosemide (LASIX) 40 MG tablet Take a whole pill with breakfast and take 1/2 a pill after lunch  45 tablet  1  . gabapentin (NEURONTIN) 400 MG capsule Take 1 capsule (400 mg total) by mouth every 8 (eight) hours.  90 capsule  1  . glimepiride (AMARYL) 2 MG tablet Take 2 mg by mouth daily with breakfast.      . levothyroxine (SYNTHROID, LEVOTHROID) 150 MCG tablet Take 150 mcg by mouth daily.      . polyethylene glycol (MIRALAX / GLYCOLAX) packet Take 17 g by mouth daily as needed. For constipation  14 each  0  . potassium chloride (K-DUR) 10 MEQ tablet Take 1 tablet (10 mEq total) by mouth daily.  30 tablet  6  . pramipexole (MIRAPEX) 0.5 MG tablet Take 0.125 mg by mouth 2 (two) times daily.      [name]  tamsulosin (FLOMAX) 0.4 MG CAPS capsule        No current facility-administered medications for this visit.    Allergies  Allergen Reactions  . Other Other (See Comments)    Anesthesia makes sick, must have antinausea medication in advance.    Past Medical History  Diagnosis Date  . PONV (postoperative nausea and vomiting)   . Hypertension   . DJD (degenerative joint disease)     WHOLE SPINE  . Hyperthyroidism     Had Iodine to resolve issue DX 10 YR AGO  . Hypothyroidism     Low d/t treatment of hyperthyroidism  . Sleep apnea     DX  2009/2010 [facility] done at [facility]  . Type II diabetes mellitus     diet controlled  . Heart murmur     Evaluated by cardiologist 4-5 years ago but does not currently see a cardiologist  . Gait disorder   . Cervical myelopathy     with myelomalacia at C5-6 and C3-4  . RLS  (restless legs syndrome)   . Peripheral neuropathy   . 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 Constipation - functional     controlled with miralax  . RBBB   . 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 CHF (congestive heart failure)   . Pneumonia     Past Surgical History  Procedure Laterality Date  . Cholecystectomy    . Back surgery      6 surgeries  . Lumbar laminectomy/decompression microdiscectomy  07/14/2012    Procedure: LUMBAR LAMINECTOMY/DECOMPRESSION MICRODISCECTOMY 2 LEVELS;  Surgeon: Eustace Moore, MD;  Location: [place] [facility];  Service: Neurosurgery;  Laterality: Left;  Lumbar two three laminectomy, left thoracic four five transpedicular diskectomy    History  Smoking status  . Former Smoker -- 2.00 packs/day for 20 years  . Types: Cigarettes  . Quit date: 06/28/1985  Smokeless tobacco  . Former Systems developer  . Quit date: 06/28/1985    History  Alcohol Use No    Family History  Problem Relation Age of Onset  . Polycythemia Mother   . Diabetes Father   . Hypertension Father   . Heart disease Father   . CAD      1 of his 4 brothers and father has CAD    Review of Systems: The review of systems is per the HPI.  All other systems were reviewed and are negative.  Physical Exam: BP 128/62  Pulse 49  Ht 6\' 1"  (1.854 m)  Wt 264 lb (119.75 kg)  BMI 34.84 kg/m2 Patient is very pleasant and in no acute distress. He previously weighed 290 pounds.  Skin is warm and dry. Color is normal.   HEENT is unremarkable. Normocephalic/atraumatic. PERRL. Sclera are nonicteric. Neck is supple. No masses. No JVD.  Lungs are clear.  Cardiac exam shows a regular rate and rhythm. HR is 48   Abdomen is soft. Extremities are with trace   edema -  He has support stockings in place.   Gait is better than last visit. Linus Mako with a walker . No gross neurologic deficits noted.  Wt Readings from Last 3 Encounters:  09/21/14 264 lb (119.75 kg)  09/18/14 256 lb 6.4 oz (116.302 kg)    09/13/14 260 lb (117.935 kg)    LABORATORY DATA/PROCEDURES:  Lab Results  Component Value Date   WBC 7.3 07/19/2014   HGB 12.7* 07/19/2014   HCT 39.8 07/19/2014   PLT 316 07/19/2014  GLUCOSE 76 08/25/2014   TRIG 145 07/18/2014   ALT 73* 07/19/2014   AST 44* 07/19/2014   NA 140 08/25/2014   K 3.7 08/25/2014   CL 103 08/25/2014   CREATININE 1.0 08/25/2014   BUN 21 08/25/2014   CO2 31 08/25/2014   INR 1.03 07/14/2012   HGBA1C 7.0* 07/10/2014    BNP (last 3 results)  Recent Labs  07/10/14 1255 07/10/14 2105  PROBNP 142.2* 1750.0*   Echo Study Conclusions from July 2015  - Left ventricle: The cavity size was normal. Wall thickness was normal. Systolic function was normal. The estimated ejection fraction was in the range of 60% to 65%.  ECG:  Sept. 24, 2015: Sinus brady at 73.  RBBB.    Assessment / Plan:

## 2014-09-21 NOTE — Assessment & Plan Note (Signed)
I seems to be making slow and steady progress. [name] He's breathing seems to be fairly well controlled. Continue same medications. He had to stop the metoprolol because of very slow heart rate. At this point he has not had these episodes of syncope and is not a candidate for pacemaker. Will continue to watch him closely. I see him again in December.

## 2014-09-21 NOTE — Assessment & Plan Note (Signed)
The patient has had a slow heart rate. His home health nurse was concerned that he might need a pacemaker. He's in sinus bradycardia. He's not had any episodes of syncope. He does have a right bundle branch block.  We'll continue to watch him closely.  At This point there is no indication for pacemaker placement.

## 2014-09-21 NOTE — Telephone Encounter (Signed)
New message    Travis [name] to home hr still  38 , when he trying to having BM having  Near syncope episode. Was advise to start a stool softer.   Home health is  suggestion that patient be re-eval - might be in heart block again.   Offer appt with APP on Monday  - home health nurse suggestion that MD might need to see patient today .

## 2014-09-21 NOTE — Telephone Encounter (Signed)
Agree with stool softener.\ ?is TSH ok I can see him if needed.

## 2014-10-02 ENCOUNTER — Other Ambulatory Visit (HOSPITAL_COMMUNITY): Payer: Self-pay | Admitting: Physical Medicine and Rehabilitation

## 2014-10-02 ENCOUNTER — Other Ambulatory Visit: Payer: Self-pay | Admitting: Neurology

## 2014-10-02 DIAGNOSIS — E1140 Type 2 diabetes mellitus with diabetic neuropathy, unspecified: Secondary | ICD-10-CM

## 2014-10-06 ENCOUNTER — Other Ambulatory Visit (HOSPITAL_COMMUNITY): Payer: Self-pay | Admitting: Physical Medicine and Rehabilitation

## 2014-10-06 ENCOUNTER — Ambulatory Visit: Payer: Medicare Other | Attending: Neurology

## 2014-10-06 DIAGNOSIS — R262 Difficulty in walking, not elsewhere classified: Secondary | ICD-10-CM | POA: Diagnosis not present

## 2014-10-06 DIAGNOSIS — R279 Unspecified lack of coordination: Secondary | ICD-10-CM | POA: Insufficient documentation

## 2014-10-06 DIAGNOSIS — M4712 Other spondylosis with myelopathy, cervical region: Secondary | ICD-10-CM | POA: Diagnosis not present

## 2014-10-06 DIAGNOSIS — M4714 Other spondylosis with myelopathy, thoracic region: Secondary | ICD-10-CM | POA: Insufficient documentation

## 2014-10-06 DIAGNOSIS — Z5189 Encounter for other specified aftercare: Secondary | ICD-10-CM | POA: Diagnosis not present

## 2014-10-06 DIAGNOSIS — M4806 Spinal stenosis, lumbar region: Secondary | ICD-10-CM | POA: Diagnosis not present

## 2014-10-09 ENCOUNTER — Ambulatory Visit: Payer: Medicare Other | Admitting: Physical Therapy

## 2014-10-19 ENCOUNTER — Ambulatory Visit: Payer: Medicare Other

## 2014-10-19 DIAGNOSIS — Z5189 Encounter for other specified aftercare: Secondary | ICD-10-CM | POA: Diagnosis not present

## 2014-10-20 ENCOUNTER — Ambulatory Visit: Payer: Medicare Other

## 2014-10-20 DIAGNOSIS — Z5189 Encounter for other specified aftercare: Secondary | ICD-10-CM | POA: Diagnosis not present

## 2014-10-23 ENCOUNTER — Other Ambulatory Visit (HOSPITAL_COMMUNITY): Payer: Self-pay | Admitting: Cardiovascular Disease

## 2014-10-23 ENCOUNTER — Other Ambulatory Visit (HOSPITAL_COMMUNITY): Payer: Self-pay | Admitting: Physical Medicine and Rehabilitation

## 2014-10-23 ENCOUNTER — Ambulatory Visit: Payer: Medicare Other

## 2014-10-23 DIAGNOSIS — Z5189 Encounter for other specified aftercare: Secondary | ICD-10-CM | POA: Diagnosis not present

## 2014-10-27 ENCOUNTER — Ambulatory Visit: Payer: Medicare Other

## 2014-10-27 DIAGNOSIS — Z5189 Encounter for other specified aftercare: Secondary | ICD-10-CM | POA: Diagnosis not present

## 2014-10-30 ENCOUNTER — Other Ambulatory Visit: Payer: Self-pay | Admitting: *Deleted

## 2014-10-30 ENCOUNTER — Ambulatory Visit: Payer: Medicare Other | Attending: Neurology

## 2014-10-30 DIAGNOSIS — M4712 Other spondylosis with myelopathy, cervical region: Secondary | ICD-10-CM | POA: Insufficient documentation

## 2014-10-30 DIAGNOSIS — R279 Unspecified lack of coordination: Secondary | ICD-10-CM | POA: Diagnosis not present

## 2014-10-30 DIAGNOSIS — R262 Difficulty in walking, not elsewhere classified: Secondary | ICD-10-CM | POA: Diagnosis not present

## 2014-10-30 DIAGNOSIS — M62838 Other muscle spasm: Secondary | ICD-10-CM

## 2014-10-30 DIAGNOSIS — Z5189 Encounter for other specified aftercare: Secondary | ICD-10-CM | POA: Diagnosis present

## 2014-10-30 DIAGNOSIS — M4714 Other spondylosis with myelopathy, thoracic region: Secondary | ICD-10-CM | POA: Diagnosis not present

## 2014-10-30 DIAGNOSIS — M4806 Spinal stenosis, lumbar region: Secondary | ICD-10-CM | POA: Diagnosis not present

## 2014-10-30 MED ORDER — BACLOFEN 10 MG PO TABS: 10.0000 mg | ORAL_TABLET | Freq: Three times a day (TID) | ORAL | Status: DC

## 2014-10-30 NOTE — Therapy (Signed)
Physical Therapy Treatment  Patient Details  Name: Travis [name] MRN: [id number] Date of Birth: 07/15/1946  Encounter Date: 10/30/2014      PT End of Session - 10/30/14 1546    Visit Number 6   Number of Visits 16   Date for PT Re-Evaluation 12/01/14   PT Start Time [id number]   PT Stop Time 1531   PT Time Calculation (min) 44 min      Past Medical History  Diagnosis Date  . PONV (postoperative nausea and vomiting)   . Hypertension   . DJD (degenerative joint disease)     WHOLE SPINE  . Hyperthyroidism     Had Iodine to resolve issue DX 10 YR AGO  . Hypothyroidism     Low d/t treatment of hyperthyroidism  . Sleep apnea     DX  2009/2010 study done at [facility]  . Type II diabetes mellitus     diet controlled  . Heart murmur     Evaluated by cardiologist 4-5 years ago but does not currently see a cardiologist  . Gait disorder   . Cervical myelopathy     with myelomalacia at C5-6 and C3-4  . RLS (restless legs syndrome)   . Peripheral neuropathy   . 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 Constipation - functional     controlled with miralax  . RBBB   . 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 CHF (congestive heart failure)   . Pneumonia     Past Surgical History  Procedure Laterality Date  . Cholecystectomy    . Back surgery      6 surgeries  . Lumbar laminectomy/decompression microdiscectomy  07/14/2012    Procedure: LUMBAR LAMINECTOMY/DECOMPRESSION MICRODISCECTOMY 2 LEVELS;  Surgeon: Eustace Moore, MD;  Location: [place] [facility];  Service: Neurosurgery;  Laterality: Left;  Lumbar two three laminectomy, left thoracic four five transpedicular diskectomy    There were no vitals taken for this visit.  Visit Diagnosis:  Lack of coordination  Difficulty in walking          Adult PT Treatment/Exercise - 10/30/14 0700    Transfers Sit to Stand   Sit to Stand 5: Supervision;With upper extremity assist;Other (comment)   Sit to Stand Details (indicate cue type and  reason) performed 10x with RW upon rising, then performed 4x with 3 second pauses after every 1/4th sit or 1/4th stand for improved eccentric control   Standardized Balance Assessment Timed Up and Go Test   TUG Normal TUG   Normal TUG (seconds) 28.6   High Level Balance Activities Side stepping;Other (comment)   High Level Balance Comments with BUE support on high mat; heel walking with RW and verbal cues to prevent toes from touching the floor and to keep head up and back straight, high knee marching with single upper extremity support and heel strike, taught pt additional 2 home exercises (see original HEP handout for image): anterior/posterior limits of stability with BUE support on RW and diagnoal weight shift/rocking with staggered feet with BUE support and verbal cue to keep back straight and head up.   Exercises Knee/Hip   Active Hamstring Stretch 3 reps;30 seconds;Other (comment)  each leg   Gastroc Stretch 2 reps;30 seconds;Other (comment)  each leg in standing with BUE support on RW            PT Short Term Goals - 10/30/14 1550    Title Independent with home exercise program to address lower extremity  and core weakness and balance impairment.   Time 4   Period Days   Status Achieved   Title Increase Berg Balance Test score to 39/56   Time 4   Period Days   Status On-going   Title Decrease TUG time to 28 seconds with LRAD   Time 4   Period Days   Status On-going   Title Increase 6 minute walk test distance to 613' with LRAD   Time 4   Period Days   Status On-going          PT Long Term Goals - 10/30/14 1555    Title Verbalize understanding of fall prevention strategies within home environment   Time 32   Period Days   Status On-going   Title increase BERG score to 46/56 for decreased fall risk   Time 32   Period Days   Title Decrease TUG time to 24 seconds with LRAD for improved effieciency of functional mobility   Time 32   Period Days   Title Sit to  stand without UE support 3/3x without retropulsion for increased safety, independence and efficiency with functional mobiltiy   Time 32   Period Days   Status On-going   Title Ambulate 1000' with cane outside on concrete with supervision for decreased fall risk and increased community access   Time 32   Period Days   Status On-going   Additional Long Term Goals Yes   Title FOTO-Pt to increase activities balance confidence test score to 20.6% for improved balance confidence.   Time 32   Period Days   Status On-going   Title Pt to increase 6 minute walk test distance to 688' for improved endurance and activity tolerance.   Time 32   Period Days   Status On-going          Plan - 10/30/14 1547    Clinical Impression Statement Pt is very motivated for therapy and is performing home exercise program 7 days per week .Pt has been encouraged to walk long distances 4 days per week, and alternate this with riding his stationary bicke 3 days per week .   Pt will benefit from skilled therapeutic intervention in order to improve on the following deficits Abnormal gait;Decreased activity tolerance;Decreased balance;Decreased coordination;Decreased strength;Decreased mobility;Difficulty walking;Improper body mechanics   Rehab Potential Good   PT Frequency Min 2X/week   PT Duration 8 weeks   PT Treatment/Interventions Gait training;Stair training;Functional mobility training;Therapeutic activities;Patient/family education;Neuromuscular re-education;Balance training;Therapeutic exercise   PT Plan Check remaining short term goals, including TUG. Initiate gait training with cane        Problem List Patient Active Problem List   Diagnosis Date Noted  . Sinus bradycardia 09/21/2014  . Thoracic spondylosis with myelopathy 09/13/2014  . Acute diastolic heart failure [id number]  . Cervical arthritis with myelopathy 07/18/2014  . Constipation - functional   . CAP (community acquired pneumonia)  07/10/2014  . Sepsis 07/10/2014  . HTN (hypertension) 07/10/2014  . OSA on CPAP 07/10/2014  . UTI (lower urinary tract infection) 07/10/2014  . Edema leg 07/10/2014  . Acute respiratory failure with hypoxia 07/10/2014  . 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 Other vitamin B12 deficiency anemia 07/13/2013  . 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 Polyneuropathy in other diseases classified elsewhere 02/17/2013  . Other general symptoms(780.99) 02/17/2013  . Abnormality of gait 02/17/2013  . Cervical spondylosis with myelopathy 02/17/2013  Mahari Strahm, Anderson Malta D 10/30/2014, 4:04 PM

## 2014-10-30 NOTE — Telephone Encounter (Signed)
recvd fax refill request - sent in electronically

## 2014-10-31 ENCOUNTER — Other Ambulatory Visit: Payer: Self-pay

## 2014-10-31 MED ORDER — GABAPENTIN 600 MG PO TABS: ORAL_TABLET | ORAL | Status: DC

## 2014-10-31 NOTE — Telephone Encounter (Signed)
Pharmacy requests 90 day Rx  

## 2014-11-03 ENCOUNTER — Ambulatory Visit: Payer: Medicare Other

## 2014-11-03 DIAGNOSIS — R279 Unspecified lack of coordination: Secondary | ICD-10-CM

## 2014-11-03 DIAGNOSIS — Z5189 Encounter for other specified aftercare: Secondary | ICD-10-CM | POA: Diagnosis not present

## 2014-11-03 DIAGNOSIS — R262 Difficulty in walking, not elsewhere classified: Secondary | ICD-10-CM

## 2014-11-03 NOTE — Therapy (Signed)
Physical Therapy Treatment  Patient Details  Name: Travis [name] MRN: [id number] Date of Birth: [birth year]  Encounter Date: 11/03/2014      PT End of Session - 11/03/14 1545    Visit Number 5   Number of Visits 16   Date for PT Re-Evaluation 12/01/14   Authorization Type Medicare-needs G codes every 10th visit   PT Start Time 0245   PT Stop Time 0338   PT Time Calculation (min) 53 min      Past Medical History  Diagnosis Date  . PONV (postoperative nausea and vomiting)   . Hypertension   . DJD (degenerative joint disease)     WHOLE SPINE  . Hyperthyroidism     Had Iodine to resolve issue DX 10 YR AGO  . Hypothyroidism     Low d/t treatment of hyperthyroidism  . Sleep apnea     DX  2009/2010 study done at [facility]  . Type II diabetes mellitus     diet controlled  . Heart murmur     Evaluated by cardiologist 4-5 years ago but does not currently see a cardiologist  . Gait disorder   . Cervical myelopathy     with myelomalacia at C5-6 and C3-4  . RLS (restless legs syndrome)   . Peripheral neuropathy   . 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 Constipation - functional     controlled with miralax  . RBBB   . 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 CHF (congestive heart failure)   . Pneumonia     Past Surgical History  Procedure Laterality Date  . Cholecystectomy    . Back surgery      6 surgeries  . Lumbar laminectomy/decompression microdiscectomy  07/14/2012    Procedure: LUMBAR LAMINECTOMY/DECOMPRESSION MICRODISCECTOMY 2 LEVELS;  Surgeon: Eustace Moore, MD;  Location: [place] [facility];  Service: Neurosurgery;  Laterality: Left;  Lumbar two three laminectomy, left thoracic four five transpedicular diskectomy    There were no vitals taken for this visit.  Visit Diagnosis:  Lack of coordination  Difficulty in walking        [facility] PT Assessment - 11/03/14 1454    Ambulation/Gait   Ambulation/Gait Yes   Ambulation/Gait Assistance --  CGA, in parallel bars  with single point cane   Ambulation Distance (Feet) --  615' with RW for 6 minute walk test   Assistive device Straight cane;Rolling walker  trialed standard and bariatric straight cane in parallel bas   Gait Pattern Step-through pattern;Poor foot clearance - left;Narrow base of support;Scissoring  three point gait pattern progressed to two point with cane   Standardized Balance Assessment   Standardized Balance Assessment Berg Balance Test;Vestibular Evaluation;Timed Up and Go Test   Berg Balance Test   Sit to Stand Able to stand  independently using hands   Standing Unsupported Able to stand safely 2 minutes   Sitting with Back Unsupported but Feet Supported on Floor or Stool Able to sit safely and securely 2 minutes   Stand to Sit Controls descent by using hands   Transfers Able to transfer safely, definite need of hands   Standing Unsupported with Eyes Closed Able to stand 10 seconds safely   Standing Ubsupported with Feet Together Able to place feet together independently and stand for 1 minute with supervision   From Standing, Reach Forward with Outstretched Arm Can reach confidently >25 cm (10")   From Standing Position, Pick up Object from Floor Able to pick  up shoe, needs supervision   From Standing Position, Turn to Look Behind Over each Shoulder Turn sideways only but maintains balance   Turn 360 Degrees Able to turn 360 degrees safely but slowly  32 seconds   Standing Unsupported, Alternately Place Feet on Step/Stool Able to complete >2 steps/needs minimal assist   Standing Unsupported, One Foot in Front Able to plae foot ahead of the other independently and hold 30 seconds   Standing on One Leg Tries to lift leg/unable to hold 3 seconds but remains standing independently  40   Timed Up and Go Test   TUG Normal TUG   Normal TUG (seconds) 22.31          [facility] Adult PT Treatment/Exercise - 11/03/14 1453    Balance   Balance Assessed Yes            PT Short Term  Goals - 11/03/14 1548    PT SHORT TERM GOAL #1   Title Independent with home exercise program to address lower extremity and core weakness and balance impairment.   Time 0   Status Achieved   PT SHORT TERM GOAL #2   Title Increase Berg Balance Test score to 39/56   Time 0   Status Achieved   PT SHORT TERM GOAL #3   Title Decrease TUG time to 28 seconds with LRAD   Time 0   Status Achieved   PT SHORT TERM GOAL #4   Title Increase 6 minute walk test distance to 613' with LRAD   Time 0   Status Achieved   PT SHORT TERM GOAL #5   Title Ambulate 500' with cane on indoor, level surfaces for improved endurance and independence   Baseline Extended to long term goal   Status Not Met          PT Long Term Goals - 11/03/14 1551    PT LONG TERM GOAL #1   Title Verbalize understanding of fall prevention strategies within home environment   Baseline to be met by 12/01/2014   Time 4   Period Weeks   Status On-going   PT LONG TERM GOAL #8   Title Ambulate 500' with cane on indoor, level surfaces for improved endurance and independence   Baseline To be met by 12/01/2014   Time 4   Period Weeks   Status On-going          Plan - 11/03/14 1547    Clinical Impression Statement Pt is making significant progress and met 4/5 STGs. Continue toward LTGs   PT Plan Continue gait training with cane, spinal extensor strengthening        Problem List Patient Active Problem List   Diagnosis Date Noted  . Sinus bradycardia 09/21/2014  . Thoracic spondylosis with myelopathy 09/13/2014  . Acute diastolic heart failure 32/54/9826  . Cervical arthritis with myelopathy 07/18/2014  . Constipation - functional   . CAP (community acquired pneumonia) 07/10/2014  . Sepsis 07/10/2014  . HTN (hypertension) 07/10/2014  . OSA on CPAP 07/10/2014  . UTI (lower urinary tract infection) 07/10/2014  . Edema leg 07/10/2014  . Acute respiratory failure with hypoxia 07/10/2014  . Lumbosacral spinal  stenosis 03/13/2014  . Other vitamin B12 deficiency anemia 07/13/2013  . 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 Polyneuropathy in other diseases classified elsewhere 02/17/2013  . Other general symptoms(780.99) 02/17/2013  . Abnormality of gait 02/17/2013  . Cervical spondylosis with myelopathy 02/17/2013  Kilian Schwartz, Anderson Malta D 11/03/2014, 3:58 PM

## 2014-11-06 ENCOUNTER — Ambulatory Visit: Payer: Medicare Other

## 2014-11-06 DIAGNOSIS — R262 Difficulty in walking, not elsewhere classified: Secondary | ICD-10-CM

## 2014-11-06 DIAGNOSIS — R279 Unspecified lack of coordination: Secondary | ICD-10-CM

## 2014-11-06 DIAGNOSIS — Z5189 Encounter for other specified aftercare: Secondary | ICD-10-CM | POA: Diagnosis not present

## 2014-11-06 NOTE — Therapy (Signed)
Physical Therapy Treatment  Patient Details  Name: Travis [name] MRN: [id number] Date of Birth: 14-Jun-1946  Encounter Date: 11/06/2014      PT End of Session - 11/06/14 1626    Visit Number 8   Number of Visits 16   Date for PT Re-Evaluation 12/01/14   Authorization Type Medicare-needs G codes every 10th visit   PT Start Time 1534   PT Stop Time 1614   PT Time Calculation (min) 40 min   Equipment Utilized During Treatment Gait belt   Activity Tolerance Patient tolerated treatment well      Past Medical History  Diagnosis Date  . PONV (postoperative nausea and vomiting)   . Hypertension   . DJD (degenerative joint disease)     WHOLE SPINE  . Hyperthyroidism     Had Iodine to resolve issue DX 10 YR AGO  . Hypothyroidism     Low d/t treatment of hyperthyroidism  . Sleep apnea     DX  2009/2010 study done at [facility]  . Type II diabetes mellitus     diet controlled  . Heart murmur     Evaluated by cardiologist 4-5 years ago but does not currently see a cardiologist  . Gait disorder   . Cervical myelopathy     with myelomalacia at C5-6 and C3-4  . RLS (restless legs syndrome)   . Peripheral neuropathy   . 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 Constipation - functional     controlled with miralax  . RBBB   . 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 CHF (congestive heart failure)   . Pneumonia     Past Surgical History  Procedure Laterality Date  . Cholecystectomy    . Back surgery      6 surgeries  . Lumbar laminectomy/decompression microdiscectomy  07/14/2012    Procedure: LUMBAR LAMINECTOMY/DECOMPRESSION MICRODISCECTOMY 2 LEVELS;  Surgeon: Eustace Moore, MD;  Location: [place] [facility];  Service: Neurosurgery;  Laterality: Left;  Lumbar two three laminectomy, left thoracic four five transpedicular diskectomy    There were no vitals taken for this visit.  Visit Diagnosis:  Lack of coordination  Difficulty in walking          [facility] Adult PT  Treatment/Exercise - 11/06/14 1543    Ambulation/Gait   Ambulation/Gait Yes   Ambulation/Gait Assistance 4: Min guard  ant/post amb. in parallel bars with 1-2 UE support.    Ambulation/Gait Assistance Details VC and tactile cues to improve heel strike, sequencing with SPC, upright posture, and stride length.    Ambulation Distance (Feet) --  parallel bars:6x10 ant. amb., 2x10 post. amb. with CGA.   Assistive device Straight cane;Rolling walker;Parallel bars  Bariatric SPC: 75',75', 40'; RW: 250'. all with CGA.   Gait Pattern Step-through pattern;Poor foot clearance - left;Narrow base of support;Scissoring  Pt progressed from three point gait to two point with VC's.            PT Short Term Goals - 11/06/14 1629    PT SHORT TERM GOAL #5   Title Ambulate 500' with cane on indoor, level surfaces for improved endurance and independence   Baseline Extended to long term goal   Time --  12/01/14   Status On-going          PT Long Term Goals - 11/06/14 1629    PT LONG TERM GOAL #1   Title Verbalize understanding of fall prevention strategies within home environment   Baseline to  be met by 12/01/2014   Time --  12/01/14   Status On-going   PT LONG TERM GOAL #2   Title increase BERG score to 46/56 for decreased fall risk   Time --  12/01/14   Status On-going   PT LONG TERM GOAL #3   Title Decrease TUG time to 24 seconds with LRAD for improved effieciency of functional mobility   Time --  12/01/14   Status On-going   PT LONG TERM GOAL #4   Title Sit to stand without UE support 3/3x without retropulsion for increased safety, independence and efficiency with functional mobiltiy   Time --  12/01/14   Status On-going   PT LONG TERM GOAL #5   Title Ambulate 1000' with cane outside on concrete with supervision for decreased fall risk and increased community access   Time --  12/01/14   Status On-going   Additional Long Term Goals   Additional Long Term Goals Yes   PT LONG TERM  GOAL #6   Title FOTO-Pt to increase activities balance confidence test score to 20.6% for improved balance confidence.   Time --  12/01/14   Status On-going   PT LONG TERM GOAL #7   Title Pt to increase 6 minute walk test distance to 688' for improved endurance and activity tolerance.   Time --  12/01/14   Status On-going   PT LONG TERM GOAL #8   Title Ambulate 500' with cane on indoor, level surfaces for improved endurance and independence   Time --  12/01/14   Status On-going          Plan - 11/06/14 1627    Clinical Impression Statement Pt demonstrated progress today, as he was able to ambulate longer distances with SPC. Pt continues to be limited by fatigue, as he required frequent seated rest breaks. Pt would continue to benefit from skilled therapy to improve functional mobility.   Pt will benefit from skilled therapeutic intervention in order to improve on the following deficits Abnormal gait;Decreased endurance;Impaired flexibility;Decreased balance;Decreased mobility;Decreased strength;Decreased knowledge of use of DME   Rehab Potential Good   PT Frequency 2x / week   PT Duration 8 weeks   PT Treatment/Interventions ADLs/Self Care Home Management;Gait training;Neuromuscular re-education;Stair training;Functional mobility training;Patient/family education;Therapeutic activities;Therapeutic exercise;Manual techniques;Balance training;DME Instruction   PT Next Visit Plan Continue gait training with bariatric SPC, spinal extensor strengthening, dynamic balance training.   Consulted and Agree with Plan of Care Patient        Problem List Patient Active Problem List   Diagnosis Date Noted  . Sinus bradycardia 09/21/2014  . Thoracic spondylosis with myelopathy 09/13/2014  . Acute diastolic heart failure 16/09/9603  . Cervical arthritis with myelopathy 07/18/2014  . Constipation - functional   . CAP (community acquired pneumonia) 07/10/2014  . Sepsis 07/10/2014  . HTN  (hypertension) 07/10/2014  . OSA on CPAP 07/10/2014  . UTI (lower urinary tract infection) 07/10/2014  . Edema leg 07/10/2014  . Acute respiratory failure with hypoxia 07/10/2014  . 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 Other vitamin B12 deficiency anemia 07/13/2013  . 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 Polyneuropathy in other diseases classified elsewhere 02/17/2013  . Other general symptoms(780.99) 02/17/2013  . Abnormality of gait 02/17/2013  . Cervical spondylosis with myelopathy 02/17/2013  Marcela Alatorre L 11/06/2014, 4:35 PM

## 2014-11-09 ENCOUNTER — Ambulatory Visit: Payer: Medicare Other

## 2014-11-09 DIAGNOSIS — R279 Unspecified lack of coordination: Secondary | ICD-10-CM

## 2014-11-09 DIAGNOSIS — Z5189 Encounter for other specified aftercare: Secondary | ICD-10-CM | POA: Diagnosis not present

## 2014-11-09 DIAGNOSIS — R262 Difficulty in walking, not elsewhere classified: Secondary | ICD-10-CM

## 2014-11-09 NOTE — Patient Instructions (Signed)
Abduction: Clam (Eccentric) - Side-Lying   Lie on side with knees bent. Lift top knee, keeping feet together, hold for 2 seconds, then slowly lower leg.[name] Keep trunk steady. _10__ reps per set, __3_ sets every other day. Add theraband around  When exercise becomes easier.  Roll onto other side and repeat with other leg. Keep hips forward and don't lean back.  Copyright  VHI. All rights reserved.

## 2014-11-09 NOTE — Therapy (Signed)
Physical Therapy Treatment  Patient Details  Name: Travis [name] MRN: [id number] Date of Birth: 05/06/1946  Encounter Date: 11/09/2014      PT End of Session - 11/09/14 1546    Visit Number 9   Number of Visits 16   Authorization Type Medicare-needs G codes every 10th visit   PT Start Time 1446   PT Stop Time 1531   PT Time Calculation (min) 45 min   Equipment Utilized During Treatment Gait belt   Activity Tolerance Patient tolerated treatment well   Behavior During Therapy WFL for tasks assessed/performed      Past Medical History  Diagnosis Date  . PONV (postoperative nausea and vomiting)   . Hypertension   . DJD (degenerative joint disease)     WHOLE SPINE  . Hyperthyroidism     Had Iodine to resolve issue DX 10 YR AGO  . Hypothyroidism     Low d/t treatment of hyperthyroidism  . Sleep apnea     DX  2009/2010 study done at [facility]  . Type II diabetes mellitus     diet controlled  . Heart murmur     Evaluated by cardiologist 4-5 years ago but does not currently see a cardiologist  . Gait disorder   . Cervical myelopathy     with myelomalacia at C5-6 and C3-4  . RLS (restless legs syndrome)   . Peripheral neuropathy   . 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 Constipation - functional     controlled with miralax  . RBBB   . 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 CHF (congestive heart failure)   . Pneumonia     Past Surgical History  Procedure Laterality Date  . Cholecystectomy    . Back surgery      6 surgeries  . Lumbar laminectomy/decompression microdiscectomy  07/14/2012    Procedure: LUMBAR LAMINECTOMY/DECOMPRESSION MICRODISCECTOMY 2 LEVELS;  Surgeon: Eustace Moore, MD;  Location: [place] [facility];  Service: Neurosurgery;  Laterality: Left;  Lumbar two three laminectomy, left thoracic four five transpedicular diskectomy    There were no vitals taken for this visit.  Visit Diagnosis:  Lack of coordination  Difficulty in walking      Subjective  Assessment - 11/09/14 1453    Symptoms I feel alright, I think I have a sinus infection.  Pt denied falls since last visit.   Currently in Pain? No/denies            [facility] Adult PT Treatment/Exercise - 11/09/14 1446    Transfers   Transfers Sit to Stand;Stand to Sit   Sit to Stand 4: Min guard;With upper extremity assist  to/from mat   Sit to Stand Details (indicate cue type and reason) performed x3 with UE assist and SPC. VC's to improve ant. weight shift.   Stand to Sit 5: Supervision;With upper extremity assist  to/from mat   Stand to Sit Details VC's to improve eccentric control.   Ambulation/Gait   Ambulation/Gait Yes   Ambulation/Gait Assistance 4: Min guard   Ambulation/Gait Assistance Details Pt ambulated 115'x2 with SPC and 100x2 with RW. VC's for sequencing with SPC, and to improve B heel strike, decrease scissor gait pattern, and improve upright posture. Pt required rest breaks after amb. due to fatigue. PT provided feedback after amb. vs. during amb.    Ambulation Distance (Feet) 430 Feet   Assistive device Straight cane;Rolling walker  bariatric SPC   Gait Pattern Step-through pattern;Poor foot clearance - left;Narrow  base of support;Scissoring;Decreased dorsiflexion - left;Decreased dorsiflexion - right   Exercises   Exercises Knee/Hip   Knee/Hip Exercises: Stretches   Active Hamstring Stretch 30 seconds;Other (comment);2 reps  B LEs seated, reviewed HEP. VC's to keep back straight.   Knee/Hip Exercises: Sidelying   Clams B LEs: 3x10. VC's to improve eccentric control and to keep hips forwards.          PT Education - 11/09/14 1545    Education provided Yes   Education Details Reviewed HEP and provided pt with progressed sidelying clamshell HEP.   Person(s) Educated Patient   Methods Explanation;Demonstration   Comprehension Returned demonstration;Verbalized understanding              Plan - 11/09/14 1548    Clinical Impression Statement PT  continues to demonstrate progress as he was able to ambulate longer distances with SPC, with reduced VCs to correct gait. Pt most limited by fatigue and decreased B LE strength, as he required seated rest breaks and experience increase scissoring gait and decr. heel strike after proglonged ambulation. Pt would continue to benefit from skilled therapy to improve safety during functional mobility.   Pt will benefit from skilled therapeutic intervention in order to improve on the following deficits Abnormal gait;Decreased endurance;Impaired flexibility;Decreased balance;Decreased mobility;Decreased strength;Decreased knowledge of use of DME   Rehab Potential Good   PT Frequency 2x / week   PT Duration 8 weeks   PT Treatment/Interventions ADLs/Self Care Home Management;Gait training;Neuromuscular re-education;Stair training;Functional mobility training;Patient/family education;Therapeutic activities;Therapeutic exercise;Manual techniques;Balance training;DME Instruction   PT Next Visit Plan Continue gait training with bariatric SPC, spinal extensor strengthening, dynamic balance training.   Consulted and Agree with Plan of Care Patient        Problem List Patient Active Problem List   Diagnosis Date Noted  . Sinus bradycardia 09/21/2014  . Thoracic spondylosis with myelopathy 09/13/2014  . Acute diastolic heart failure 123XX123  . Cervical arthritis with myelopathy 07/18/2014  . Constipation - functional   . CAP (community acquired pneumonia) 07/10/2014  . Sepsis 07/10/2014  . HTN (hypertension) 07/10/2014  . OSA on CPAP 07/10/2014  . UTI (lower urinary tract infection) 07/10/2014  . Edema leg 07/10/2014  . Acute respiratory failure with hypoxia 07/10/2014  . 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 Other vitamin B12 deficiency anemia 07/13/2013  . 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 Polyneuropathy in other diseases classified elsewhere 02/17/2013  . Other  general symptoms(780.99) 02/17/2013  . Abnormality of gait 02/17/2013  . Cervical spondylosis with myelopathy 02/17/2013                                              Dalante Minus L 11/09/2014, 3:53 PM     Shellene Sweigert L, PT 3:53 PM

## 2014-11-13 ENCOUNTER — Ambulatory Visit: Payer: Medicare Other

## 2014-11-13 DIAGNOSIS — R262 Difficulty in walking, not elsewhere classified: Secondary | ICD-10-CM

## 2014-11-13 DIAGNOSIS — R279 Unspecified lack of coordination: Secondary | ICD-10-CM

## 2014-11-13 DIAGNOSIS — Z5189 Encounter for other specified aftercare: Secondary | ICD-10-CM | POA: Diagnosis not present

## 2014-11-13 DIAGNOSIS — R269 Unspecified abnormalities of gait and mobility: Secondary | ICD-10-CM

## 2014-11-13 NOTE — Therapy (Signed)
Physical Therapy Treatment  Patient Details  Name: Travis [name] MRN: [id number] Date of Birth: 08/06/1946  Encounter Date: Nov 15, 2014      PT End of Session - Nov 15, 2014 1536    Visit Number 10   Number of Visits 16   Date for PT Re-Evaluation 12/01/14   Authorization Type Medicare-needs G codes every 10th visit   PT Start Time [id number]   PT Stop Time 1528   PT Time Calculation (min) 43 min      Past Medical History  Diagnosis Date  . PONV (postoperative nausea and vomiting)   . Hypertension   . DJD (degenerative joint disease)     WHOLE SPINE  . Hyperthyroidism     Had Iodine to resolve issue DX 10 YR AGO  . Hypothyroidism     Low d/t treatment of hyperthyroidism  . Sleep apnea     DX  2009/2010 study done at [facility]  . Type II diabetes mellitus     diet controlled  . Heart murmur     Evaluated by cardiologist 4-5 years ago but does not currently see a cardiologist  . Gait disorder   . Cervical myelopathy     with myelomalacia at C5-6 and C3-4  . RLS (restless legs syndrome)   . Peripheral neuropathy   . 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 Constipation - functional     controlled with miralax  . RBBB   . 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 CHF (congestive heart failure)   . Pneumonia     Past Surgical History  Procedure Laterality Date  . Cholecystectomy    . Back surgery      6 surgeries  . Lumbar laminectomy/decompression microdiscectomy  07/14/2012    Procedure: LUMBAR LAMINECTOMY/DECOMPRESSION MICRODISCECTOMY 2 LEVELS;  Surgeon: Eustace Moore, MD;  Location: [place] [facility];  Service: Neurosurgery;  Laterality: Left;  Lumbar two three laminectomy, left thoracic four five transpedicular diskectomy    There were no vitals taken for this visit.  Visit Diagnosis:  Lack of coordination  Difficulty in walking  Abnormality of gait      Subjective Assessment - 2014/11/15 1450    Symptoms no pain/no falls. Pt feels his balance is improving      Therex: -Standing ab-roller exercise with BUE on physioball and physioball on mat. 3x8 with BUE support and tactile cues for form. -Standing spinal extension with BUE shoulders flexed, with trunk supported by physioball 3x6 -Sit to stands wtihout UE push up from progressively lowering mat heights with 3 reps per height at 6 different mat heights progressing to 20.5". Able to stand from 17.5" standard chair height with single UE push off.  Performed for functional quadriceps strength.  Neuro Re-ed: Single leg stance training with emphasis on balance/coordination and maintaining spinal extension: ball rolling beneath single LE with BUE support, progressing to finger tip only support; then alternate foot taps on 1st and 2nd step with BUE support, progressing to finger tip support +CGA.                Plan - November 15, 2014 1536    Clinical Impression Statement Making progress with functional quadriceps strength, also bearing less weight through BUE on RW.   PT Next Visit Plan Gait training with bariatric SPC, trial sit to stands from standard chair without UE support, single leg stance activities          G-Codes - 11-15-2014 1538    Functional Assessment Tool  Used Merrilee Jansky 40/56   Functional Limitation Mobility: Walking and moving around   Mobility: Walking and Moving Around Current Status [phone]) At least 20 percent but less than 40 percent impaired, limited or restricted   Mobility: Walking and Moving Around Goal Status [phone]) At least 1 percent but less than 20 percent impaired, limited or restricted      Problem List Patient Active Problem List   Diagnosis Date Noted  . Sinus bradycardia 09/21/2014  . Thoracic spondylosis with myelopathy 09/13/2014  . Acute diastolic heart failure 123XX123  . Cervical arthritis with myelopathy 07/18/2014  . Constipation - functional   . CAP (community acquired pneumonia) 07/10/2014  . Sepsis 07/10/2014  . HTN (hypertension) 07/10/2014  .  OSA on CPAP 07/10/2014  . UTI (lower urinary tract infection) 07/10/2014  . Edema leg 07/10/2014  . Acute respiratory failure with hypoxia 07/10/2014  . 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 Other vitamin B12 deficiency anemia 07/13/2013  . 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 Polyneuropathy in other diseases classified elsewhere 02/17/2013  . Other general symptoms(780.99) 02/17/2013  . Abnormality of gait 02/17/2013  . Cervical spondylosis with myelopathy 02/17/2013                                              Delrae Sawyers D 11/13/2014, 3:42 PM

## 2014-11-15 ENCOUNTER — Encounter: Payer: Self-pay | Admitting: Neurology

## 2014-11-17 ENCOUNTER — Encounter: Payer: Self-pay | Admitting: Physical Medicine & Rehabilitation

## 2014-11-17 ENCOUNTER — Ambulatory Visit: Payer: Medicare Other

## 2014-11-17 ENCOUNTER — Encounter: Payer: Medicare Other | Attending: Physical Medicine & Rehabilitation | Admitting: Physical Medicine & Rehabilitation

## 2014-11-17 VITALS — BP 200/104 | HR 51 | Resp 14 | Ht 73.0 in | Wt 269.0 lb

## 2014-11-17 DIAGNOSIS — R269 Unspecified abnormalities of gait and mobility: Secondary | ICD-10-CM

## 2014-11-17 DIAGNOSIS — R262 Difficulty in walking, not elsewhere classified: Secondary | ICD-10-CM

## 2014-11-17 DIAGNOSIS — I10 Essential (primary) hypertension: Secondary | ICD-10-CM

## 2014-11-17 DIAGNOSIS — M4714 Other spondylosis with myelopathy, thoracic region: Secondary | ICD-10-CM

## 2014-11-17 DIAGNOSIS — R279 Unspecified lack of coordination: Secondary | ICD-10-CM

## 2014-11-17 DIAGNOSIS — M4807 Spinal stenosis, lumbosacral region: Secondary | ICD-10-CM

## 2014-11-17 MED ORDER — LOSARTAN POTASSIUM 50 MG PO TABS
50.0000 mg | ORAL_TABLET | Freq: Every day | ORAL | Status: DC
Start: 2014-11-17 — End: 2014-11-27

## 2014-11-17 NOTE — Patient Instructions (Signed)
PLEASE CALL ME WITH ANY PROBLEMS OR QUESTIONS (#[phone]).      

## 2014-11-17 NOTE — Therapy (Signed)
Physical Therapy Treatment  Patient Details  Name: Travis [name] MRN: [id number] Date of Birth: August 21, 1946  Encounter Date: 11/17/2014    Past Medical History  Diagnosis Date  . PONV (postoperative nausea and vomiting)   . Hypertension   . DJD (degenerative joint disease)     WHOLE SPINE  . Hyperthyroidism     Had Iodine to resolve issue DX 10 YR AGO  . Hypothyroidism     Low d/t treatment of hyperthyroidism  . Sleep apnea     DX  2009/2010 study done at [facility]  . Type II diabetes mellitus     diet controlled  . Heart murmur     Evaluated by cardiologist 4-5 years ago but does not currently see a cardiologist  . Gait disorder   . Cervical myelopathy     with myelomalacia at C5-6 and C3-4  . RLS (restless legs syndrome)   . Peripheral neuropathy   . 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 Constipation - functional     controlled with miralax  . RBBB   . 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 CHF (congestive heart failure)   . Pneumonia     Past Surgical History  Procedure Laterality Date  . Cholecystectomy    . Back surgery      6 surgeries  . Lumbar laminectomy/decompression microdiscectomy  07/14/2012    Procedure: LUMBAR LAMINECTOMY/DECOMPRESSION MICRODISCECTOMY 2 LEVELS;  Surgeon: Eustace Moore, MD;  Location: [place] [facility];  Service: Neurosurgery;  Laterality: Left;  Lumbar two three laminectomy, left thoracic four five transpedicular diskectomy    There were no vitals taken for this visit.  Visit Diagnosis:  No diagnosis found.      Subjective Assessment - 11/17/14 1540    Symptoms I saw Dr. Naaman Plummer today and he said I'm doing great! I told him I'll be using a cane next time I see him   Currently in Pain? No/denies     Checked blood pressure  With machine and with manual cuff with the same results:   BP 211/85 HR 54  No therapy today due to severe hypertension See patient instructions.        PT Education - 11/17/14 1558    Education  provided Yes   Education Details hypertension   Person(s) Educated Patient   Methods Explanation;Handout   Comprehension Verbalized understanding              Plan - 11/17/14 1559    Clinical Impression Statement unable to perform therapy due to hypertension   PT Next Visit Plan Gait training with bariatric SPC, trial sit to stands from standard chair without UE support, single leg stance activities   PT Plan Continue gait training with cane, hip abductor and spinal extensor strengthening        Problem List Patient Active Problem List   Diagnosis Date Noted  . Sinus bradycardia 09/21/2014  . Thoracic spondylosis with myelopathy 09/13/2014  . Acute diastolic heart failure 123XX123  . Cervical arthritis with myelopathy 07/18/2014  . Constipation - functional   . CAP (community acquired pneumonia) 07/10/2014  . Sepsis 07/10/2014  . HTN (hypertension) 07/10/2014  . OSA on CPAP 07/10/2014  . UTI (lower urinary tract infection) 07/10/2014  . Edema leg 07/10/2014  . Acute respiratory failure with hypoxia 07/10/2014  . 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 Other vitamin B12 deficiency anemia 07/13/2013  . 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 Polyneuropathy  in other diseases classified elsewhere 02/17/2013  . Other general symptoms(780.99) 02/17/2013  . Abnormality of gait 02/17/2013  . Cervical spondylosis with myelopathy 02/17/2013       BP 211/85 HR 54                                       AldersonAnderson Malta D 11/17/2014, 4:01 PM

## 2014-11-17 NOTE — Progress Notes (Signed)
Subjective:    Patient ID: Travis [name], male    DOB: March 13, 1946, 68 y.o.   MRN: [id number]  HPI   Mr. Hostler is back regarding his cervical myelopathy. He has been doing fairly well as of late. His bp has been hi (elevated in office today too). He was started on losartan for the elevated bp a couple weeks ago.   He's had no falls. His pain is controlled. No skin issues noted. He's had an overactive bladder for which he's seeing urology for. He has a UDS next month.   His sugars have been under better control. I reviewed his logue today.    Pain Inventory Average Pain 0 Pain Right Now 0 My pain is no pain  In the last 24 hours, has pain interfered with the following? General activity 2 Relation with others no selection Enjoyment of life 5 What TIME of day is your pain at its worst? no selection Sleep (in general) Fair  Pain is worse with: walking and standing Pain improves with: rest Relief from Meds: 5  Mobility walk with assistance use a walker ability to climb steps?  yes do you drive?  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  no  Physicians involved in your care Any changes since last visit?  no   Family History  Problem Relation Age of Onset  . Polycythemia Mother   . Diabetes Father   . Hypertension Father   . Heart disease Father   . CAD      1 of his 4 brothers and father has CAD   History   Social History  . Marital Status: Married    Spouse Name: [name]    Number of Children: 3  . Years of Education: 16   Occupational History  .      Works in [place]  . Smoking status: Former Smoker -- 2.00 packs/day for 20 years    Types: Cigarettes    Quit date: 06/28/1985  . Smokeless tobacco: Former Systems developer    Quit date: 06/28/1985  . Alcohol Use: No  . Drug  Use: No  . Sexual Activity: None   Other Topics Concern  . None   Social History Narrative   Patient is married with 3 children.   Patient has 16 yrs of education.         Past Surgical History  Procedure Laterality Date  . Cholecystectomy    . Back surgery      6 surgeries  . Lumbar laminectomy/decompression microdiscectomy  07/14/2012    Procedure: LUMBAR LAMINECTOMY/DECOMPRESSION MICRODISCECTOMY 2 LEVELS;  Surgeon: Eustace Moore, MD;  Location: [place] [facility];  Service: Neurosurgery;  Laterality: Left;  Lumbar two three laminectomy, left thoracic four five transpedicular diskectomy   Past Medical History  Diagnosis Date  . PONV (postoperative nausea and vomiting)   . Hypertension   . DJD (degenerative joint disease)     WHOLE SPINE  . Hyperthyroidism     Had Iodine to resolve issue DX 10 YR AGO  . Hypothyroidism     Low d/t treatment of hyperthyroidism  . Sleep apnea     DX  2009/2010 study done at [facility]  . Type II diabetes mellitus     diet controlled  . Heart murmur     Evaluated by cardiologist  4-5 years ago but does not currently see a cardiologist  . Gait disorder   . Cervical myelopathy     with myelomalacia at C5-6 and C3-4  . RLS (restless legs syndrome)   . Peripheral neuropathy   . 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 Constipation - functional     controlled with miralax  . RBBB   . 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 CHF (congestive heart failure)   . Pneumonia    BP 200/104 mmHg  Pulse 51  Resp 14  Ht 6\' 1"  (1.854 m)  Wt 269 lb (122.018 kg)  BMI 35.50 kg/m2  SpO2 95%  Opioid Risk Score:   Fall Risk Score: Moderate Fall Risk (6-13 points) Review of Systems  Cardiovascular: Positive for leg swelling.  Neurological: Positive for weakness and numbness.  All other systems reviewed and are negative.      Objective:   Physical Exam  Constitutional: He is oriented to person, place, and time.  HENT: dentition fair. Oral mucosa  pink/moist  Head: Normocephalic and atraumatic.  Eyes: Conjunctivae are normal. Pupils are equal, round, and reactive to light.  Neck: Normal range of motion. Neck supple.  Cardiovascular: Normal rate and regular rhythm. No murmurs  Respiratory: Effort normal and breath sounds normal. No respiratory distress. He has wheezes in upper airway.  GI: Soft. Bowel sounds are normal. He exhibits no distension. There is no tenderness.  Musculoskeletal: minimal edema in both legs.. Has low back pain  Neurological: He is alert and oriented to person, place, and time. Appears rested CN exam normal.  Intrinsic atrophy bilateral hands  UE: 5/5. LE: HF 4- to 4/5. RKE 4-, LKE 4, ankles 4 to 4+/5. Sensory deficits to propiocetpion and light touch in both legs below the knees. Fingers without sensory deficits. . He does scissor with gait but there is improvement. He's able to keep his legs spread a bit further. He still puts a good amount of weight on his walker with his upper body. He leans a bit forward as well. Changes directions well. Skin: chronic changes on feet/ dry  Psych: pleasant, appropriate.     Assessment/Plan:  1. Functional deficits secondary to Cervical myelopathy with deconditioning after pneumonia  -continue outpt therapies  -continue to use his walker at all times  -balance is improving--encouraged more uprright posture and less reliance on upper body during gait  2. Chronic Pain Management: hold Voltaren due to recent "kidney issues" unless otherwise instructed by PCP  -baclofen for cramps/spasms  -maintain gabapentin for neuropathic pain   4. HTN: increase losartan to 50mg  daily --if continued elevation, he needs to follow up with pcp 5. Urology: UDS per urology.   Follow up with me in about 6 months. Thirty minutes of face to face patient care time were spent during this visit. All questions were encouraged and answered.

## 2014-11-17 NOTE — Patient Instructions (Signed)
High Blood pressure:  Begin checking blood pressure regularly at home. Contact PCP and tell him that your blood pressure was BP 211/85 HR 54  Ask: What is his recommendation? Any changes to medications? At what blood pressure should you seek medical attention?  Try to avoid salty foods, greasy or fried foods, and limit consumption of red meat.  We can't do therapy if blood pressure is [id number] systolic or 123XX123 diastolic.

## 2014-11-20 ENCOUNTER — Ambulatory Visit: Payer: Medicare Other

## 2014-11-20 ENCOUNTER — Telehealth: Payer: Self-pay | Admitting: Cardiovascular Disease

## 2014-11-20 NOTE — Telephone Encounter (Signed)
**Note De-Identified  Obfuscation** The pts wife states that the pts BP was elevated at 210/90 at rehab this past Wednesday.   Dr Marisue Humble, the pts PCP, was contacted and he advised the pt to increase his Losartan to 50 mg daily which the pt did.   The pts wife states that he takes his Losartan at around 7:30 every morning and that she checked it at 8 am this morning and his BP was 188/95.   She is concerned because she states that his BP is elevated in the evenings but they did not start checking his BP until this past Wednesday and they are not checking it consistently ever night so she is unsure of the readings at this time.   The pts wife is advised that they should monitor his BP once in the am (1 and 1/2 to 2 hours after taking his Losartan) and once in the pm until at least Friday and to call us with the pts BP readings.  She verbalized understanding and agrees  with plan.

## 2014-11-20 NOTE — Telephone Encounter (Signed)
New problem   Pt blood pressure is 200+/80-95. Please advise pt.

## 2014-11-21 ENCOUNTER — Encounter: Payer: Self-pay | Admitting: Neurology

## 2014-11-21 LAB — HM DIABETES EYE EXAM

## 2014-11-22 ENCOUNTER — Other Ambulatory Visit: Payer: Self-pay | Admitting: Physical Medicine and Rehabilitation

## 2014-11-27 ENCOUNTER — Telehealth: Payer: Self-pay | Admitting: Cardiovascular Disease

## 2014-11-27 ENCOUNTER — Ambulatory Visit: Payer: Medicare Other

## 2014-11-27 VITALS — BP 186/106 | HR 48

## 2014-11-27 DIAGNOSIS — R279 Unspecified lack of coordination: Secondary | ICD-10-CM

## 2014-11-27 DIAGNOSIS — I10 Essential (primary) hypertension: Secondary | ICD-10-CM

## 2014-11-27 DIAGNOSIS — Z5189 Encounter for other specified aftercare: Secondary | ICD-10-CM | POA: Diagnosis not present

## 2014-11-27 DIAGNOSIS — R262 Difficulty in walking, not elsewhere classified: Secondary | ICD-10-CM

## 2014-11-27 DIAGNOSIS — R269 Unspecified abnormalities of gait and mobility: Secondary | ICD-10-CM

## 2014-11-27 MED ORDER — LOSARTAN POTASSIUM 50 MG PO TABS: ORAL_TABLET | ORAL | Status: DC

## 2014-11-27 NOTE — Telephone Encounter (Signed)
Sorry, I didn't see that. Have him increase the losartan to 100 a day.  I'll see him in several weeks. We will get a bmp at that time

## 2014-11-27 NOTE — Therapy (Signed)
Physical Therapy Treatment  Patient Details  Name: Travis [name] MRN: [id number] Date of Birth: [birth year]  Encounter Date: 11/27/2014      PT End of Session - 11/27/14 1601    Visit Number 11   Number of Visits 16   Date for PT Re-Evaluation 12/01/14   Authorization Type Medicare-needs G codes every 10th visit   PT Start Time 1530   PT Stop Time 1600   PT Time Calculation (min) 30 min   Activity Tolerance Other (comment)  limited by hypertension      Past Medical History  Diagnosis Date  . PONV (postoperative nausea and vomiting)   . Hypertension   . DJD (degenerative joint disease)     WHOLE SPINE  . Hyperthyroidism     Had Iodine to resolve issue DX 10 YR AGO  . Hypothyroidism     Low d/t treatment of hyperthyroidism  . Sleep apnea     DX  2009/2010 study done at [facility]  . Type II diabetes mellitus     diet controlled  . Heart murmur     Evaluated by cardiologist 4-5 years ago but does not currently see a cardiologist  . Gait disorder   . Cervical myelopathy     with myelomalacia at C5-6 and C3-4  . RLS (restless legs syndrome)   . Peripheral neuropathy   . 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 Constipation - functional     controlled with miralax  . RBBB   . 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 CHF (congestive heart failure)   . Pneumonia     Past Surgical History  Procedure Laterality Date  . Cholecystectomy    . Back surgery      6 surgeries  . Lumbar laminectomy/decompression microdiscectomy  07/14/2012    Procedure: LUMBAR LAMINECTOMY/DECOMPRESSION MICRODISCECTOMY 2 LEVELS;  Surgeon: Eustace Moore, MD;  Location: [place] [facility];  Service: Neurosurgery;  Laterality: Left;  Lumbar two three laminectomy, left thoracic four five transpedicular diskectomy    BP 186/106 mmHg  Pulse 48  SpO2 95%  Visit Diagnosis:  Lack of coordination  Difficulty in walking  Abnormality of gait      Subjective Assessment - 11/27/14 1600    Symptoms  Checked BP before coming and it was good. My cardiologist called me while on the way to therapy and said I absolutely shouldn't have any salt. That kind of upset me because I already have to limit my sugar.   Currently in Pain? No/denies     Discussed pt's progress with HEP at home. Re-iterated importance of checking blood pressure prior to performing HEP to ensure it is not approaching 242 systolic or 683 diastolic. Pt verbalized understanding.  Discussed pt's phone call with his physician, which occurred just prior to pt's arrival here today and pt felt that the conversation may have caused the increase in blood pressure, since it was fine when he first left the house. Trialed relaxation techniques as well as a 81' walk with emphasis on increased step length and foot clearance with RW and supervision and verbal cues. Neither the relaxation technique or the walking decreased the blood pressure. Each time it was checked (checked 4x manually), the BP was 200/4mHg.              PT Short Term Goals - 11/27/14 1604    PT SHORT TERM GOAL #5   Title Ambulate 500' with cane on indoor, level surfaces for improved  endurance and independence   Baseline Extended to long term goal   Time --  12/01/14   Status On-going          PT Long Term Goals - 11/27/14 1604    PT LONG TERM GOAL #1   Title Verbalize understanding of fall prevention strategies within home environment   Baseline to be met by 12/01/2014   Time --  12/01/14   Status On-going   PT LONG TERM GOAL #2   Title increase BERG score to 46/56 for decreased fall risk   Time --  12/01/14   Status On-going   PT LONG TERM GOAL #3   Title Decrease TUG time to 24 seconds with LRAD for improved effieciency of functional mobility   Time --  12/01/14   Status On-going   PT LONG TERM GOAL #4   Title Sit to stand without UE support 3/3x without retropulsion for increased safety, independence and efficiency with functional mobiltiy    Time --  12/01/14   Status On-going   PT LONG TERM GOAL #5   Title Ambulate 1000' with cane outside on concrete with supervision for decreased fall risk and increased community access   Time --  12/01/14   Status On-going   PT LONG TERM GOAL #6   Title FOTO-Pt to increase activities balance confidence test score to 20.6% for improved balance confidence.   Time --  12/01/14   Status On-going   PT LONG TERM GOAL #7   Title Pt to increase 6 minute walk test distance to 688' for improved endurance and activity tolerance.   Time --  12/01/14   Status On-going   PT LONG TERM GOAL #8   Title Ambulate 500' with cane on indoor, level surfaces for improved endurance and independence   Time --  12/01/14   Status On-going          Plan - 11/27/14 1602    Clinical Impression Statement Unable to perform much activity due to hypertension. Pt hopes to be able to return on Friday.   PT Plan If BP continues to be an issue, pt will cancel appointment until after he sees his cardiologist on 12/08/14. If BP is WNL, Continue gait training with cane, hip abductor and spinal extensor strengthening        Problem List Patient Active Problem List   Diagnosis Date Noted  . Sinus bradycardia 09/21/2014  . Thoracic spondylosis with myelopathy 09/13/2014  . Acute diastolic heart failure 76/81/1572  . Cervical arthritis with myelopathy 07/18/2014  . Constipation - functional   . CAP (community acquired pneumonia) 07/10/2014  . Sepsis 07/10/2014  . HTN (hypertension) 07/10/2014  . OSA on CPAP 07/10/2014  . UTI (lower urinary tract infection) 07/10/2014  . Edema leg 07/10/2014  . Acute respiratory failure with hypoxia 07/10/2014  . 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 Other vitamin B12 deficiency anemia 07/13/2013  . 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 Polyneuropathy in other diseases classified elsewhere 02/17/2013  . Other general symptoms(780.99) 02/17/2013  .  Abnormality of gait 02/17/2013  . Cervical spondylosis with myelopathy 02/17/2013                                              Delrae Sawyers D 11/27/2014, 4:06 PM

## 2014-11-27 NOTE — Telephone Encounter (Signed)
New Msg   Patient calling to report BP readings: Mon. 175/80  Tues. 171/78 Wed. 162/78 Thurs. 165/79 Fri. 190/98 Sat. 145/56 Sun. 160/75  If need to contact at [phone]

## 2014-11-27 NOTE — Telephone Encounter (Signed)
BP readings are moderately elevated.   Have him avoid salt. Start Losartan 50 mg a day.  He should see me or NP/PA  in 3-4 weeks for office visit and BMP

## 2014-11-27 NOTE — Telephone Encounter (Signed)
Per Dr. Acie Fredrickson increase Losartan to 100 mg daily.  Lm for pt to increase.

## 2014-11-27 NOTE — Telephone Encounter (Signed)
Spoke w/wife who states he is taking Losartan 50 mg a day now.  Advised her that he needs to watch his salt intake.  He has an app w/Dr. Acie Fredrickson on 12/11.  Will get labs prior to visit.

## 2014-11-27 NOTE — Telephone Encounter (Signed)
Calling w/BP readings.  States he is feeling much better. No headache.  Advised Dr. Acie Fredrickson not in office today but will forward to him and someone should call him back tomorrow.

## 2014-12-01 ENCOUNTER — Ambulatory Visit: Payer: Medicare Other | Attending: Neurology

## 2014-12-01 ENCOUNTER — Encounter: Payer: Self-pay | Admitting: Cardiovascular Disease

## 2014-12-01 VITALS — BP 202/85 | HR 52

## 2014-12-01 DIAGNOSIS — R262 Difficulty in walking, not elsewhere classified: Secondary | ICD-10-CM

## 2014-12-01 DIAGNOSIS — M4714 Other spondylosis with myelopathy, thoracic region: Secondary | ICD-10-CM | POA: Insufficient documentation

## 2014-12-01 DIAGNOSIS — Z5189 Encounter for other specified aftercare: Secondary | ICD-10-CM | POA: Insufficient documentation

## 2014-12-01 DIAGNOSIS — M4712 Other spondylosis with myelopathy, cervical region: Secondary | ICD-10-CM | POA: Insufficient documentation

## 2014-12-01 DIAGNOSIS — M4806 Spinal stenosis, lumbar region: Secondary | ICD-10-CM | POA: Insufficient documentation

## 2014-12-01 DIAGNOSIS — R279 Unspecified lack of coordination: Secondary | ICD-10-CM

## 2014-12-01 NOTE — Therapy (Signed)
[facility] [address] [place], [place], [address] Phone: [phone]   Fax:  [phone]  Physical Therapy Treatment  Patient Details  Name: Travis [name] MRN: [id number] Date of Birth: 07-May-1946  Encounter Date: 12/01/2014  Pt arrived at clinic but unable to be seen due to BP 202/85 checked at rest with machine, and after 115' gentle walk with manual cuff and same results. HR 52 BPM. Pt instructed to cancel appointment on 12/7, and seek parameters from his cardiologist to determine if he should or should not be participating in therapy on hypertensive days.  Past Medical History  Diagnosis Date  . PONV (postoperative nausea and vomiting)   . Hypertension   . DJD (degenerative joint disease)     WHOLE SPINE  . Hyperthyroidism     Had Iodine to resolve issue DX 10 YR AGO  . Hypothyroidism     Low d/t treatment of hyperthyroidism  . Sleep apnea     DX  2009/2010 study done at [facility]  . Type II diabetes mellitus     diet controlled  . Heart murmur     Evaluated by cardiologist 4-5 years ago but does not currently see a cardiologist  . Gait disorder   . Cervical myelopathy     with myelomalacia at C5-6 and C3-4  . RLS (restless legs syndrome)   . Peripheral neuropathy   . 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 Constipation - functional     controlled with miralax  . RBBB   . 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 CHF (congestive heart failure)   . Pneumonia     Past Surgical History  Procedure Laterality Date  . Cholecystectomy    . Back surgery      6 surgeries  . Lumbar laminectomy/decompression microdiscectomy  07/14/2012    Procedure: LUMBAR LAMINECTOMY/DECOMPRESSION MICRODISCECTOMY 2 LEVELS;  Surgeon: Eustace Moore, MD;  Location: [place] [facility];  Service: Neurosurgery;  Laterality: Left;  Lumbar two three laminectomy, left thoracic four five transpedicular diskectomy    BP 202/85 mmHg  Pulse 52  Visit  Diagnosis:  Lack of coordination  Difficulty in walking                                         Problem List Patient Active Problem List   Diagnosis Date Noted  . Sinus bradycardia 09/21/2014  . Thoracic spondylosis with myelopathy 09/13/2014  . Acute diastolic heart failure 123XX123  . Cervical arthritis with myelopathy 07/18/2014  . Constipation - functional   . CAP (community acquired pneumonia) 07/10/2014  . Sepsis 07/10/2014  . HTN (hypertension) 07/10/2014  . OSA on CPAP 07/10/2014  . UTI (lower urinary tract infection) 07/10/2014  . Edema leg 07/10/2014  . Acute respiratory failure with hypoxia 07/10/2014  . 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 Other vitamin B12 deficiency anemia 07/13/2013  . 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 Polyneuropathy in other diseases classified elsewhere 02/17/2013  . Other general symptoms(780.99) 02/17/2013  . Abnormality of gait 02/17/2013  . Cervical spondylosis with myelopathy 02/17/2013    Delrae Sawyers D 12/01/2014, 4:04 PM

## 2014-12-02 DIAGNOSIS — M4712 Other spondylosis with myelopathy, cervical region: Secondary | ICD-10-CM | POA: Diagnosis not present

## 2014-12-02 DIAGNOSIS — M4806 Spinal stenosis, lumbar region: Secondary | ICD-10-CM | POA: Diagnosis not present

## 2014-12-02 DIAGNOSIS — R279 Unspecified lack of coordination: Secondary | ICD-10-CM | POA: Diagnosis not present

## 2014-12-02 DIAGNOSIS — M4714 Other spondylosis with myelopathy, thoracic region: Secondary | ICD-10-CM | POA: Diagnosis not present

## 2014-12-02 DIAGNOSIS — Z5189 Encounter for other specified aftercare: Secondary | ICD-10-CM | POA: Diagnosis present

## 2014-12-02 DIAGNOSIS — R262 Difficulty in walking, not elsewhere classified: Secondary | ICD-10-CM | POA: Diagnosis not present

## 2014-12-04 ENCOUNTER — Ambulatory Visit: Payer: Medicare Other

## 2014-12-08 ENCOUNTER — Ambulatory Visit (INDEPENDENT_AMBULATORY_CARE_PROVIDER_SITE_OTHER): Payer: Medicare Other | Admitting: Cardiovascular Disease

## 2014-12-08 ENCOUNTER — Ambulatory Visit: Payer: Medicare Other

## 2014-12-08 ENCOUNTER — Other Ambulatory Visit (INDEPENDENT_AMBULATORY_CARE_PROVIDER_SITE_OTHER): Payer: Medicare Other | Admitting: *Deleted

## 2014-12-08 ENCOUNTER — Encounter: Payer: Self-pay | Admitting: Cardiovascular Disease

## 2014-12-08 VITALS — BP 140/85 | HR 48 | Ht 73.0 in | Wt 268.4 lb

## 2014-12-08 DIAGNOSIS — Z5189 Encounter for other specified aftercare: Secondary | ICD-10-CM | POA: Diagnosis not present

## 2014-12-08 DIAGNOSIS — R279 Unspecified lack of coordination: Secondary | ICD-10-CM

## 2014-12-08 DIAGNOSIS — I10 Essential (primary) hypertension: Secondary | ICD-10-CM

## 2014-12-08 DIAGNOSIS — R262 Difficulty in walking, not elsewhere classified: Secondary | ICD-10-CM

## 2014-12-08 DIAGNOSIS — R269 Unspecified abnormalities of gait and mobility: Secondary | ICD-10-CM

## 2014-12-08 MED ORDER — LOSARTAN POTASSIUM 100 MG PO TABS: 100.0000 mg | ORAL_TABLET | Freq: Every day | ORAL | Status: DC

## 2014-12-08 NOTE — Therapy (Signed)
[facility] [address] [place], [place], [address] Phone: [phone]   Fax:  [phone]  Physical Therapy Treatment  Patient Details  Name: Travis [name] MRN: [id number] Date of Birth: 08/06/1946  Encounter Date: 12/08/2014      PT End of Session - 12/08/14 1622    Visit Number 12   Number of Visits 16   Date for PT Re-Evaluation 02/06/15   Authorization Type Medicare-needs G codes every 10th visit   PT Start Time 1538   PT Stop Time 1622   PT Time Calculation (min) 44 min      Past Medical History  Diagnosis Date  . PONV (postoperative nausea and vomiting)   . Hypertension   . DJD (degenerative joint disease)     WHOLE SPINE  . Hyperthyroidism     Had Iodine to resolve issue DX 10 YR AGO  . Hypothyroidism     Low d/t treatment of hyperthyroidism  . Sleep apnea     DX  2009/2010 study done at [facility]  . Type II diabetes mellitus     diet controlled  . Heart murmur     Evaluated by cardiologist 4-5 years ago but does not currently see a cardiologist  . Gait disorder   . Cervical myelopathy     with myelomalacia at C5-6 and C3-4  . RLS (restless legs syndrome)   . Peripheral neuropathy   . 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 Constipation - functional     controlled with miralax  . RBBB   . 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 CHF (congestive heart failure)   . Pneumonia     Past Surgical History  Procedure Laterality Date  . Cholecystectomy    . Back surgery      6 surgeries  . Lumbar laminectomy/decompression microdiscectomy  07/14/2012    Procedure: LUMBAR LAMINECTOMY/DECOMPRESSION MICRODISCECTOMY 2 LEVELS;  Surgeon: David S Jones, MD;  Location: [facility];  Service: Neurosurgery;  Laterality: Left;  Lumbar two three laminectomy, left thoracic four five transpedicular diskectomy    There were no vitals taken for this visit.  Visit Diagnosis:  Lack of coordination  Difficulty in  walking  Abnormality of gait      Subjective Assessment - 12/08/14 1542    Symptoms Pt had visit with cardiologist today and he said that the systolic limit to discontinue therapy session is 200mmhg, but that pt's high readings were not true hypertension and were occurring due to low heartrate.   Currently in Pain? No/denies          [facility] PT Assessment - 12/08/14 0001    Standardized Balance Assessment   Standardized Balance Assessment Berg Balance Test   Berg Balance Test   Sit to Stand Able to stand without using hands and stabilize independently   Standing Unsupported Able to stand safely 2 minutes   Sitting with Back Unsupported but Feet Supported on Floor or Stool Able to sit safely and securely 2 minutes   Stand to Sit Sits safely with minimal use of hands   Transfers Able to transfer safely, definite need of hands   Standing Unsupported with Eyes Closed Able to stand 10 seconds safely   Standing Ubsupported with Feet Together Able to place feet together independently and stand 1 minute safely   From Standing, Reach Forward with Outstretched Arm Can reach confidently >25 cm (10")   From Standing Position, Pick up Object from Floor Able to   pick up shoe, needs supervision      Therapy session:  Self care Discussed pt's cardiologist visit/outcome of that visit (see subjective section)  Gait training x822' with cane on outdoor unlevel concrete with supervision, with pt noted to demonstrate scissoring (but decreased compared to eval), foot drag-worse on L than R.   Neuro re-ed: TUG with RW 16.22 sec TUG with cane 21.59 seconds Safe turns on both TUG tests.  Sit to stand 3x from standard chair with BUE push off without retropulsion  Began assessing Berg. Will complete next session.          PT Long Term Goals - 12/08/14 1544    PT LONG TERM GOAL #1   Title (p) Verbalize understanding of fall prevention strategies within home environment   Baseline (p) to be met  by 12/01/2014   Time (p) --  12/01/14   Status (p) On-going   PT LONG TERM GOAL #2   Title (p) increase BERG score to 46/56 for decreased fall risk   Time (p) --  12/01/14   Status (p) On-going   PT LONG TERM GOAL #3   Title (p) Decrease TUG time to 24 seconds with LRAD for improved effieciency of functional mobility   Time (p) --  12/01/14   Status (p) Achieved   PT LONG TERM GOAL #4   Title (p) Sit to stand without UE support 3/3x without retropulsion for increased safety, independence and efficiency with functional mobiltiy   Time (p) --  12/01/14   Status (p) Achieved   PT LONG TERM GOAL #5   Title (p) Ambulate 1000' with cane outside on concrete with supervision for decreased fall risk and increased community access   Time (p) --  12/01/14   Status (p) On-going   PT LONG TERM GOAL #6   Title (p) FOTO-Pt to increase activities balance confidence test score to 20.6% for improved balance confidence.   Time (p) --  12/01/14   Status (p) On-going   PT LONG TERM GOAL #7   Title (p) Pt to increase 6 minute walk test distance to 688' for improved endurance and activity tolerance.   Time (p) --  12/01/14   Status (p) On-going   PT LONG TERM GOAL #8   Title (p) Ambulate 500' with cane on indoor, level surfaces for improved endurance and independence   Time (p) --  12/01/14   Status (p) On-going          Plan - 12/08/14 1625    Clinical Impression Statement Pt is making significant progress toward goals despite missing 3-4 therapy sessions due to hypertension episodes. Began checking long term goals today, and pt met TUG goal, appears to be meeting BERG goal (unable to complete test due to time restriction today), and is progressing towrad ambulation distance goal with the cane. Continue per plan .   Rehab Potential Good   PT Frequency 2x / week   PT Duration 8 weeks   PT Treatment/Interventions ADLs/Self Care Home Management;Gait training;Neuromuscular re-education;Stair  training;Functional mobility training;Patient/family education;Therapeutic activities;Therapeutic exercise;Manual techniques;Balance training;DME Instruction   PT Next Visit Plan finish checking Merrilee Jansky and other LTGs; update goals and send renewal to physician (renewal start date 12/08/14 and end date 02/06/15).                                Problem List Patient Active Problem List   Diagnosis Date Noted  .  Sinus bradycardia 09/21/2014  . Thoracic spondylosis with myelopathy 09/13/2014  . Acute diastolic heart failure 07/18/2014  . Cervical arthritis with myelopathy 07/18/2014  . Constipation - functional   . CAP (community acquired pneumonia) 07/10/2014  . Sepsis 07/10/2014  . HTN (hypertension) 07/10/2014  . OSA on CPAP 07/10/2014  . UTI (lower urinary tract infection) 07/10/2014  . Edema leg 07/10/2014  . Acute respiratory failure with hypoxia 07/10/2014  . 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 Other vitamin B12 deficiency anemia 07/13/2013  . 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 Polyneuropathy in other diseases classified elsewhere 02/17/2013  . Other general symptoms(780.99) 02/17/2013  . Abnormality of gait 02/17/2013  . Cervical spondylosis with myelopathy 02/17/2013    ,  D 12/08/2014, 4:36 PM      

## 2014-12-08 NOTE — Assessment & Plan Note (Addendum)
Travis [name] is doing ok. When i measured his BP, I found a falsly elevated systolic BP which is due to the reflection of the pulse wave and not the true systolic BP.   His true BP was actually much better.   I've encouraged him to ask the nurses to take his BP carefully and slowly and to listen to the lower reading - which is the correct one.  Continue same meds.  i'll see him in 3 months.

## 2014-12-08 NOTE — Patient Instructions (Signed)
Your physician recommends that you continue on your current medications as directed. Please refer to the Current Medication list given to you today.  Your physician recommends that you schedule a follow-up appointment in: 3 months with Dr. Nahser.   

## 2014-12-08 NOTE — Addendum Note (Signed)
Addended by: Eulis Foster on: 12/08/2014 02:16 PM   Modules accepted: Orders

## 2014-12-08 NOTE — Progress Notes (Signed)
Travis [name] Date of Birth: [birth year] Medical Record [id number]   Problem List 1. Hypertension 2. Hypothyroidism 3. DM 4. OSA   History of Present Illness: Travis [name] is seen back today for a post hospital visit.  He is the father - in -law of [name] and [name] ([place])    I saw him in consultatoin on July 17, 2014 for issues related to his diastolic dysfunction in the setting of pneumonia /  respiratory failure  He was seen By Truitt Merle in late August for follow up of his hospitalization.   [name] He is a 68 year old male with HTN, hypothyroidism, OSA, poorly controlled type 2 DM, former smoker and DJD.  Admitted back in July with CAP/UTI with associated sepsis. Was intubated due to progressive respiratory failure. Had AKI/ATN in the setting of PNA, hypoxic RF and septic shock. Felt to have some degree of diastolic HF - echo was not of good quality. Apparently has had past high sodium diet and uncontrolled HTN. Looks like he was on Lisinopril HCT, cardura and a lower dose of beta blocker. Mildly elevated troponin but no anginal symptoms and this was suspected to be from respiratory failure/CAP with septic shock.   Was seen by Cecille Rubin on August 28,  Reported getting stronger,  Breathing was ok.  Leg edema has improved.    Sept. 9, 2015:  Travis [name] is seen today with his wife. Walking with a walker.  Getting stronger every day.  Has cut out his high salt foods.   He i still working Administrator, sports estate)  Has been seen by home health nurses - HR has been slow.  No episodes of syncope.   Sept. 24, 2015:  Travis [name] is doing OK but continues to gradually gained strength. His home health nurse recently noted that his heart rate was very slow. Occasionally have some episodes of very mild lightheadedness when he sat on the toilet type of bowel movement. He has not had episodes of syncope and seems to be doing pretty well at other times.  He  Has not had any syncope.    Dec. 11, 2015:  Travis [name] is here today for his HTN,   Has OSA - has been wearing his CPAP.  , no CP and no dyspnea.  Occasional eipisodes of orthostatic hypotension. Making progress with his rehab but he had to suspend it because of his elevated BP Does his best to avoid salt.  We reviewed his diet - no high salt items typically .  BP at the rehab is typically elevated but    Current Outpatient Prescriptions  Medication Sig Dispense Refill  . baclofen (LIORESAL) 10 MG tablet Take 1 tablet (10 mg total) by mouth 3 (three) times daily. For muscle spasms 90 each 1  . ciclopirox (PENLAC) 8 % solution Apply topically at bedtime.     [name] escitalopram (LEXAPRO) 5 MG tablet Take 1 tablet (5 mg total) by mouth at bedtime. (Patient taking differently: Take 10 mg by mouth at bedtime. ) 30 tablet 1  . furosemide (LASIX) 40 MG tablet TAKE A WHOLE PILL WITH BREAKFAST AND TAKE 1/2 A PILL AFTER LUNCH 45 tablet 5  . gabapentin (NEURONTIN) 400 MG capsule TAKE 1 CAPSULE (400 MG TOTAL) BY MOUTH EVERY 8 (EIGHT) HOURS. 90 capsule 1  . glimepiride (AMARYL) 2 MG tablet Take 2 mg by mouth daily with breakfast.    . levothyroxine (SYNTHROID, LEVOTHROID) 150 MCG tablet Take 175  mcg by mouth daily.     [name] losartan (COZAAR) 100 MG tablet Take 1 tablet (100 mg total) by mouth daily. 90 tablet 3  . polyethylene glycol (MIRALAX / GLYCOLAX) packet Take 17 g by mouth daily as needed. For constipation 14 each 0  . potassium chloride (K-DUR) 10 MEQ tablet Take 1 tablet (10 mEq total) by mouth daily. 30 tablet 6  . pramipexole (MIRAPEX) 0.5 MG tablet Take 0.125 mg by mouth 2 (two) times daily.    . tamsulosin (FLOMAX) 0.4 MG CAPS capsule Take 0.4 mg by mouth daily.      No current facility-administered medications for this visit.    Allergies  Allergen Reactions  . Other Other (See Comments)    Anesthesia makes sick, must have antinausea medication in advance.    Past Medical History  Diagnosis Date  . PONV  (postoperative nausea and vomiting)   . Hypertension   . DJD (degenerative joint disease)     WHOLE SPINE  . Hyperthyroidism     Had Iodine to resolve issue DX 10 YR AGO  . Hypothyroidism     Low d/t treatment of hyperthyroidism  . Sleep apnea     DX  2009/2010 study done at [facility]  . Type II diabetes mellitus     diet controlled  . Heart murmur     Evaluated by cardiologist 4-5 years ago but does not currently see a cardiologist  . Gait disorder   . Cervical myelopathy     with myelomalacia at C5-6 and C3-4  . RLS (restless legs syndrome)   . Peripheral neuropathy   . 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 Constipation - functional     controlled with miralax  . RBBB   . 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 CHF (congestive heart failure)   . Pneumonia     Past Surgical History  Procedure Laterality Date  . Cholecystectomy    . Back surgery      6 surgeries  . Lumbar laminectomy/decompression microdiscectomy  07/14/2012    Procedure: LUMBAR LAMINECTOMY/DECOMPRESSION MICRODISCECTOMY 2 LEVELS;  Surgeon: Eustace Moore, MD;  Location: [place] [facility];  Service: Neurosurgery;  Laterality: Left;  Lumbar two three laminectomy, left thoracic four five transpedicular diskectomy    History  Smoking status  . Former Smoker -- 2.00 packs/day for 20 years  . Types: Cigarettes  . Quit date: 06/28/1985  Smokeless tobacco  . Former Systems developer  . Quit date: 06/28/1985    History  Alcohol Use No    Family History  Problem Relation Age of Onset  . Polycythemia Mother   . Diabetes Father   . Hypertension Father   . Heart disease Father   . CAD      1 of his 4 brothers and father has CAD    Review of Systems: The review of systems is per the HPI.  All other systems were reviewed and are negative.  Physical Exam: BP 140/85 mmHg  Pulse 48  Ht 6\' 1"  (1.854 m)  Wt 268 lb 6.4 oz (121.745 kg)  BMI 35.42 kg/m2  SpO2 97% Patient is very pleasant and in no acute distress. He  previously weighed 290 pounds.  Skin is warm and dry. Color is normal.   HEENT is unremarkable. Normocephalic/atraumatic. PERRL. Sclera are nonicteric. Neck is supple. No masses. No JVD.  Lungs are clear.  Cardiac exam shows a regular rate and rhythm. HR is 48   Abdomen is  soft. Extremities are with trace   edema -  He has support stockings in place.   Gait is better than last visit. Linus Mako with a walker . No gross neurologic deficits noted.  Wt Readings from Last 3 Encounters:  12/08/14 268 lb 6.4 oz (121.745 kg)  11/17/14 269 lb (122.018 kg)  09/21/14 264 lb (119.75 kg)    LABORATORY DATA/PROCEDURES:  Lab Results  Component Value Date   WBC 7.3 07/19/2014   HGB 12.7* 07/19/2014   HCT 39.8 07/19/2014   PLT 316 07/19/2014   GLUCOSE 76 08/25/2014   TRIG 145 07/18/2014   ALT 73* 07/19/2014   AST 44* 07/19/2014   NA 140 08/25/2014   K 3.7 08/25/2014   CL 103 08/25/2014   CREATININE 1.0 08/25/2014   BUN 21 08/25/2014   CO2 31 08/25/2014   TSH 2.17 09/21/2014   INR 1.03 07/14/2012   HGBA1C 7.0* 07/10/2014    BNP (last 3 results)  Recent Labs  07/10/14 1255 07/10/14 2105  PROBNP 142.2* 1750.0*   Echo Study Conclusions from July 2015  - Left ventricle: The cavity size was normal. Wall thickness was normal. Systolic function was normal. The estimated ejection fraction was in the range of 60% to 65%.  ECG:  Sept. 24, 2015: Sinus brady at 11.  RBBB.    Assessment / Plan:

## 2014-12-11 ENCOUNTER — Ambulatory Visit: Payer: Medicare Other

## 2014-12-11 DIAGNOSIS — R269 Unspecified abnormalities of gait and mobility: Secondary | ICD-10-CM

## 2014-12-11 DIAGNOSIS — Z5189 Encounter for other specified aftercare: Secondary | ICD-10-CM | POA: Diagnosis not present

## 2014-12-11 DIAGNOSIS — R279 Unspecified lack of coordination: Secondary | ICD-10-CM

## 2014-12-11 DIAGNOSIS — R262 Difficulty in walking, not elsewhere classified: Secondary | ICD-10-CM

## 2014-12-11 NOTE — Therapy (Signed)
[facility] [address] [place], [place], [address] Phone: [phone]   Fax:  [phone]  Physical Therapy Treatment  Patient Details  Name: Travis [name] MRN: [id number] Date of Birth: May 14, 1946  Encounter Date: 12/11/2014      PT End of Session - 12/11/14 1624    Visit Number 13   Number of Visits 29   Date for PT Re-Evaluation 02/06/15   Authorization Type Medicare-needs G codes every 10th visit   PT Start Time 1533   PT Stop Time 1620   PT Time Calculation (min) 47 min      Past Medical History  Diagnosis Date  . PONV (postoperative nausea and vomiting)   . Hypertension   . DJD (degenerative joint disease)     WHOLE SPINE  . Hyperthyroidism     Had Iodine to resolve issue DX 10 YR AGO  . Hypothyroidism     Low d/t treatment of hyperthyroidism  . Sleep apnea     DX  2009/2010 study done at [facility]  . Type II diabetes mellitus     diet controlled  . Heart murmur     Evaluated by cardiologist 4-5 years ago but does not currently see a cardiologist  . Gait disorder   . Cervical myelopathy     with myelomalacia at C5-6 and C3-4  . RLS (restless legs syndrome)   . Peripheral neuropathy   . 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 Constipation - functional     controlled with miralax  . RBBB   . 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 CHF (congestive heart failure)   . Pneumonia     Past Surgical History  Procedure Laterality Date  . Cholecystectomy    . Back surgery      6 surgeries  . Lumbar laminectomy/decompression microdiscectomy  07/14/2012    Procedure: LUMBAR LAMINECTOMY/DECOMPRESSION MICRODISCECTOMY 2 LEVELS;  Surgeon: Eustace Moore, MD;  Location: [place] [facility];  Service: Neurosurgery;  Laterality: Left;  Lumbar two three laminectomy, left thoracic four five transpedicular diskectomy    There were no vitals taken for this visit.  Visit Diagnosis:  Lack of coordination - Plan: PT plan of care  cert/re-cert  Difficulty in walking - Plan: PT plan of care cert/re-cert  Abnormality of gait - Plan: PT plan of care cert/re-cert      Subjective Assessment - 12/11/14 1538    Symptoms I'm not sure if I should tell you this but I tried walking without the cane 4 laps of about 50' at home. I didn't falls.     Completed the [employer] test.  Trialed ambulating without UE support as pt reports he began doing this at home. Pt was unsafe and required MOD A to steady. Decreased R foot clearance worse then left was noted, as well as mild scissoring. Pt instructed not to ambulate at home without assistive device.  Gait training in parallel bars with board on the floor to inhibit scissoring initially with  BUE support, then single UE, then without UE support with CGA. Ambulating forward and backward, and high knee marching to promote improved foot clearance.   Trialed R blue rocker AFO in parallel bars and with RW. This provided minimal improvement in foot clearance likely due to the fact that the decreased foot clearance is largely due to weak hip abductors and weak hip flexors.      [facility] PT Assessment - 12/11/14 0001    Standardized  Balance Assessment   Standardized Balance Assessment Berg Balance Test   Berg Balance Test   Sit to Stand Able to stand without using hands and stabilize independently   Standing Unsupported Able to stand safely 2 minutes   Sitting with Back Unsupported but Feet Supported on Floor or Stool Able to sit safely and securely 2 minutes   Stand to Sit Sits safely with minimal use of hands   Transfers Able to transfer safely, definite need of hands   Standing Unsupported with Eyes Closed Able to stand 10 seconds safely   Standing Ubsupported with Feet Together Able to place feet together independently and stand 1 minute safely   From Standing, Reach Forward with Outstretched Arm Can reach confidently >25 cm (10")   From Standing Position, Pick up Object from Floor  Able to pick up shoe, needs supervision   From Standing Position, Turn to Look Behind Over each Shoulder Looks behind from both sides and weight shifts well   Turn 360 Degrees Needs close supervision or verbal cueing   Standing Unsupported, Alternately Place Feet on Step/Stool Able to complete 4 steps without aid or supervision   Standing Unsupported, One Foot in Front Able to plae foot ahead of the other independently and hold 30 seconds   Standing on One Leg Tries to lift leg/unable to hold 3 seconds but remains standing independently   Total Score 45          [facility] Adult PT Treatment/Exercise - 12/11/14 0001    Standardized Balance Assessment   Standardized Balance Assessment Berg Balance Test              PT Long Term Goals - 12/11/14 1539    PT LONG TERM GOAL #1   Title Verbalize understanding of fall prevention strategies within home environment   Baseline to be met by 12/01/2014   Time --  12/01/14   Status On-going   PT LONG TERM GOAL #2   Title To be met by 01/05/15: increase BERG score to 46/56 for decreased fall risk   Time --  12/01/14   Status On-going  scored 45/56 on 12/11/14   PT LONG TERM GOAL #3   Title Decrease TUG time to 24 seconds with LRAD for improved effieciency of functional mobility   Time --  12/01/14   Status Achieved   PT LONG TERM GOAL #4   Title Sit to stand without UE support 3/3x without retropulsion for increased safety, independence and efficiency with functional mobiltiy   Time --  12/01/14   Status Achieved   PT LONG TERM GOAL #5   Title To be met by 01/05/15: Ambulate 1000' with cane outside on concrete with supervision for decreased fall risk and increased community access   Time --  12/01/14   Status On-going   Additional Long Term Goals   Additional Long Term Goals Yes   PT LONG TERM GOAL #6   Title To be met by 02/06/15: FOTO-Pt to increase activities balance confidence test score to 20.6% for improved balance confidence.   Time --   12/01/14   Status On-going   PT LONG TERM GOAL #7   Title To be met by 01/05/15: Pt to increase 6 minute walk test distance to 688' for improved endurance and activity tolerance.   Time --  12/01/14   Status On-going   PT LONG TERM GOAL #8   Title Ambulate 500' with cane on indoor, level surfaces for improved endurance and independence  Time --  12/01/14   Status Achieved  Achieved on 12/08/14          Plan - 12/11/14 1625    Clinical Impression Statement Pt is making excellent progress toward therapy goals, but due to hypertensive episodes missed multiple visits. Pt is expected to progress toward remaining therapy goals by the end of this new certification period.   Pt will benefit from skilled therapeutic intervention in order to improve on the following deficits Abnormal gait;Decreased endurance;Impaired flexibility;Decreased balance;Decreased mobility;Decreased strength;Decreased knowledge of use of DME   Rehab Potential Good   PT Frequency 2x / week   PT Duration 8 weeks   PT Treatment/Interventions ADLs/Self Care Home Management;Gait training;Neuromuscular re-education;Stair training;Functional mobility training;Patient/family education;Therapeutic activities;Therapeutic exercise;Manual techniques;Balance training;DME Instruction   PT Next Visit Plan leg press, hip flexor strengthening, stepping over small obstacles in parallel bars                               Problem List Patient Active Problem List   Diagnosis Date Noted  . Sinus bradycardia 09/21/2014  . Thoracic spondylosis with myelopathy 09/13/2014  . Acute diastolic heart failure 96/72/8979  . Cervical arthritis with myelopathy 07/18/2014  . Constipation - functional   . CAP (community acquired pneumonia) 07/10/2014  . Sepsis 07/10/2014  . HTN (hypertension) 07/10/2014  . OSA on CPAP 07/10/2014  . UTI (lower urinary tract infection) 07/10/2014  . Edema leg 07/10/2014  . Acute  respiratory failure with hypoxia 07/10/2014  . 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 Other vitamin B12 deficiency anemia 07/13/2013  . 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 Polyneuropathy in other diseases classified elsewhere 02/17/2013  . Other general symptoms(780.99) 02/17/2013  . Abnormality of gait 02/17/2013  . Cervical spondylosis with myelopathy 02/17/2013    Delrae Sawyers D 12/11/2014, 4:53 PM

## 2014-12-19 ENCOUNTER — Ambulatory Visit: Payer: Medicare Other

## 2014-12-19 DIAGNOSIS — Z5189 Encounter for other specified aftercare: Secondary | ICD-10-CM | POA: Diagnosis not present

## 2014-12-19 DIAGNOSIS — R279 Unspecified lack of coordination: Secondary | ICD-10-CM

## 2014-12-19 DIAGNOSIS — R269 Unspecified abnormalities of gait and mobility: Secondary | ICD-10-CM

## 2014-12-19 DIAGNOSIS — R262 Difficulty in walking, not elsewhere classified: Secondary | ICD-10-CM

## 2014-12-19 NOTE — Therapy (Signed)
[facility] [address] [place], [place], [address] Phone: [phone]   Fax:  [phone]  Physical Therapy Treatment  Patient Details  Name: Travis [name] MRN: [id number] Date of Birth: 1946/10/07  Encounter Date: 12/19/2014      PT End of Session - 12/19/14 1641    Visit Number 14   Number of Visits 29   Date for PT Re-Evaluation 02/06/15   Authorization Type Medicare-needs G codes every 10th visit   PT Start Time 1403   PT Stop Time 1444   PT Time Calculation (min) 41 min   Equipment Utilized During Treatment Gait belt   Activity Tolerance Patient tolerated treatment well   Behavior During Therapy [facility] for tasks assessed/performed      Past Medical History  Diagnosis Date  . PONV (postoperative nausea and vomiting)   . Hypertension   . DJD (degenerative joint disease)     WHOLE SPINE  . Hyperthyroidism     Had Iodine to resolve issue DX 10 YR AGO  . Hypothyroidism     Low d/t treatment of hyperthyroidism  . Sleep apnea     DX  2009/2010 study done at [facility]  . Type II diabetes mellitus     diet controlled  . Heart murmur     Evaluated by cardiologist 4-5 years ago but does not currently see a cardiologist  . Gait disorder   . Cervical myelopathy     with myelomalacia at C5-6 and C3-4  . RLS (restless legs syndrome)   . Peripheral neuropathy   . 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 Constipation - functional     controlled with miralax  . RBBB   . 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 CHF (congestive heart failure)   . Pneumonia     Past Surgical History  Procedure Laterality Date  . Cholecystectomy    . Back surgery      6 surgeries  . Lumbar laminectomy/decompression microdiscectomy  07/14/2012    Procedure: LUMBAR LAMINECTOMY/DECOMPRESSION MICRODISCECTOMY 2 LEVELS;  Surgeon: Eustace Moore, MD;  Location: [place] [facility];  Service: Neurosurgery;  Laterality: Left;  Lumbar two three  laminectomy, left thoracic four five transpedicular diskectomy    There were no vitals taken for this visit.  Visit Diagnosis:  Lack of coordination  Difficulty in walking  Abnormality of gait      Subjective Assessment - 12/19/14 1409    Symptoms Pt denied falls since last visit. Pt reported he has felt "sluggish" for the last few days. He is not sure why.   Currently in Pain? No/denies                    [facility] Adult PT Treatment/Exercise - 12/19/14 1410    Exercises   Exercises --     Therex: performed with supervision to min guard. -marching in parallel bars with 3 lb. Ankle weights donned on B LEs to improve hip flexor strength, 6x7'. VC's to improve hip/knee flexion and upright posture. Pt noted to experience decrease in scissor gait with weights. -wall squats with 5 second holds x10. Pt required standing rest break prior to final 2 reps due to fatigue. Pt also utilized chair in front of him to assist with balance and to obtain full upright position after squat.  Neuro re-ed: -[place] weight shifting in parallel bars with 1-2 UE support and min guard, then progressed to no UE with min A  to maintain balance. -Pt stepped over 1/2", 1", and 2" beams in bar with 1 UE support and min guard- min A to maintain balance (6x7'). VC's to improve weight shifting in lateral direction and then to shift weight in anterior direction in order to advance swing leg and to clear beam.  Gait: -pt ambulated in parallel bars in anterior direction and while performing marches, with rod on floor to decrease narrow BOS, with 0-1 UE support 6x7'/activity. VC's to improve upright posture, heel strike, and to decrease narrow BOS. -pt ambulated 75'x2 and 44' with RW and supervision to ensure safety. VC's to decrease weight through UEs.             PT Short Term Goals - 11/27/14 1604    PT SHORT TERM GOAL #5   Title Ambulate 500' with cane on indoor, level surfaces for improved  endurance and independence   Baseline Extended to long term goal   Time --  12/01/14   Status On-going           PT Long Term Goals - 12/11/14 1539    PT LONG TERM GOAL #1   Title Verbalize understanding of fall prevention strategies within home environment   Baseline to be met by 12/01/2014   Time --  12/01/14   Status On-going   PT LONG TERM GOAL #2   Title To be met by 01/05/15: increase BERG score to 46/56 for decreased fall risk   Time --  12/01/14   Status On-going  scored 45/56 on 12/11/14   PT LONG TERM GOAL #3   Title Decrease TUG time to 24 seconds with LRAD for improved effieciency of functional mobility   Time --  12/01/14   Status Achieved   PT LONG TERM GOAL #4   Title Sit to stand without UE support 3/3x without retropulsion for increased safety, independence and efficiency with functional mobiltiy   Time --  12/01/14   Status Achieved   PT LONG TERM GOAL #5   Title To be met by 01/05/15: Ambulate 1000' with cane outside on concrete with supervision for decreased fall risk and increased community access   Time --  12/01/14   Status On-going   Additional Long Term Goals   Additional Long Term Goals Yes   PT LONG TERM GOAL #6   Title To be met by 02/06/15: FOTO-Pt to increase activities balance confidence test score to 20.6% for improved balance confidence.   Time --  12/01/14   Status On-going   PT LONG TERM GOAL #7   Title To be met by 01/05/15: Pt to increase 6 minute walk test distance to 688' for improved endurance and activity tolerance.   Time --  12/01/14   Status On-going   PT LONG TERM GOAL #8   Title Ambulate 500' with cane on indoor, level surfaces for improved endurance and independence   Time --  12/01/14   Status Achieved  Achieved on 12/08/14               Plan - 12/19/14 1641    Clinical Impression Statement Pt demonstrated progress, as he was able to ambulate in parallel bars without UE support, with min guard to min A. Pt continues to  experience difficulty clearing feet during ambulation, which is likely due to weak hip muscles. Continue with POC.   Pt will benefit from skilled therapeutic intervention in order to improve on the following deficits Abnormal gait;Decreased endurance;Impaired flexibility;Decreased balance;Decreased mobility;Decreased strength;Decreased knowledge  of use of DME   Rehab Potential Good   PT Frequency 2x / week   PT Duration 8 weeks   PT Treatment/Interventions ADLs/Self Care Home Management;Gait training;Neuromuscular re-education;Stair training;Functional mobility training;Patient/family education;Therapeutic activities;Therapeutic exercise;Manual techniques;Balance training;DME Instruction   PT Next Visit Plan Hip abductors/flexor strengthening, dynamic balance training (weight shifting)   Consulted and Agree with Plan of Care Patient        Problem List Patient Active Problem List   Diagnosis Date Noted  . Sinus bradycardia 09/21/2014  . Thoracic spondylosis with myelopathy 09/13/2014  . Acute diastolic heart failure 97/01/6377  . Cervical arthritis with myelopathy 07/18/2014  . Constipation - functional   . CAP (community acquired pneumonia) 07/10/2014  . Sepsis 07/10/2014  . HTN (hypertension) 07/10/2014  . OSA on CPAP 07/10/2014  . UTI (lower urinary tract infection) 07/10/2014  . Edema leg 07/10/2014  . Acute respiratory failure with hypoxia 07/10/2014  . 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 Other vitamin B12 deficiency anemia 07/13/2013  . 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 Polyneuropathy in other diseases classified elsewhere 02/17/2013  . Other general symptoms(780.99) 02/17/2013  . Abnormality of gait 02/17/2013  . Cervical spondylosis with myelopathy 02/17/2013    Bianna Haran L 12/19/2014, 4:45 PM  [facility] [address] [place], [place], [address] Phone:  [phone]   Fax:  [phone]     Geoffry Paradise, PT,DPT 12/19/2014 4:45 PM Phone: [phone] Fax: [phone]

## 2014-12-21 ENCOUNTER — Ambulatory Visit: Payer: Medicare Other | Admitting: Family Medicine

## 2014-12-25 ENCOUNTER — Ambulatory Visit: Payer: Medicare Other

## 2014-12-25 DIAGNOSIS — R279 Unspecified lack of coordination: Secondary | ICD-10-CM

## 2014-12-25 DIAGNOSIS — Z5189 Encounter for other specified aftercare: Secondary | ICD-10-CM | POA: Diagnosis not present

## 2014-12-25 DIAGNOSIS — R269 Unspecified abnormalities of gait and mobility: Secondary | ICD-10-CM

## 2014-12-25 DIAGNOSIS — R262 Difficulty in walking, not elsewhere classified: Secondary | ICD-10-CM

## 2014-12-25 NOTE — Therapy (Signed)
[facility] [address] [place], [place], [address] Phone: [phone]   Fax:  [phone]  Physical Therapy Treatment  Patient Details  Name: Travis [name] MRN: [id number] Date of Birth: 12-21-1946  Encounter Date: 12/25/2014      PT End of Session - 12/25/14 1608    Visit Number 15   Number of Visits 29   Date for PT Re-Evaluation 02/06/15   Authorization Type Medicare-needs G codes every 10th visit   PT Start Time 1404   PT Stop Time 1445   PT Time Calculation (min) 41 min   Equipment Utilized During Treatment Gait belt   Activity Tolerance Patient tolerated treatment well   Behavior During Therapy [facility] for tasks assessed/performed      Past Medical History  Diagnosis Date  . PONV (postoperative nausea and vomiting)   . Hypertension   . DJD (degenerative joint disease)     WHOLE SPINE  . Hyperthyroidism     Had Iodine to resolve issue DX 10 YR AGO  . Hypothyroidism     Low d/t treatment of hyperthyroidism  . Sleep apnea     DX  2009/2010 study done at [facility]  . Type II diabetes mellitus     diet controlled  . Heart murmur     Evaluated by cardiologist 4-5 years ago but does not currently see a cardiologist  . Gait disorder   . Cervical myelopathy     with myelomalacia at C5-6 and C3-4  . RLS (restless legs syndrome)   . Peripheral neuropathy   . 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 Constipation - functional     controlled with miralax  . RBBB   . 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 CHF (congestive heart failure)   . Pneumonia     Past Surgical History  Procedure Laterality Date  . Cholecystectomy    . Back surgery      6 surgeries  . Lumbar laminectomy/decompression microdiscectomy  07/14/2012    Procedure: LUMBAR LAMINECTOMY/DECOMPRESSION MICRODISCECTOMY 2 LEVELS;  Surgeon: Eustace Moore, MD;  Location: [place] [facility];  Service: Neurosurgery;  Laterality: Left;  Lumbar two three  laminectomy, left thoracic four five transpedicular diskectomy    There were no vitals taken for this visit.  Visit Diagnosis:  Difficulty in walking  Abnormality of gait  Lack of coordination      Subjective Assessment - 12/25/14 1407    Symptoms Pt denied falls or changes since last visit. Pt reported he is feeling better today, but that he forgot his RW.   Currently in Pain? No/denies                    [facility] Adult PT Treatment/Exercise - 12/25/14 1408    Ambulation/Gait   Ambulation/Gait Yes   Ambulation/Gait Assistance 5: Supervision   Ambulation/Gait Assistance Details Pt ambulated over even terrain with RW and 3-prong cane. VC's to improve B heel strike, stay close to RW, improve upright posture, and to decr. scissor gait. Pt required seated rest breaks after ambulation due to fatigue.   Ambulation Distance (Feet) --  59' with RW in 10 minutes, 93' with cane, 75'x6 with RW.   Assistive device Straight cane;Rolling walker   Gait Pattern Step-through pattern;Poor foot clearance - left;Narrow base of support;Scissoring;Decreased dorsiflexion - left;Decreased dorsiflexion - right     Therex: -Reviewed wall squats and marches along counter; added both to HEP. -Seated hip abduction  with blue theraband x10. -Seated B ankle dorsiflexion with blue theraband x10/LE. -Backwards ambulation with one hand on counter and supervision for safety, 4x10'. VC's to take even steps, improve posture, and to clear feet during swing phase. -Sidestepping with red theraband around ankles 2x10'. VC's to decrease hip ER. PT encouraged pt to perform this at home with yellow theraband, as red theraband resulted in too many compensation movements.           PT Education - 12/25/14 1607    Education provided Yes   Education Details Progressed HEP   Person(s) Educated Patient   Methods Explanation;Demonstration;Verbal cues;Handout   Comprehension Verbalized understanding;Returned  demonstration          PT Short Term Goals - 11/27/14 1604    PT SHORT TERM GOAL #5   Title Ambulate 500' with cane on indoor, level surfaces for improved endurance and independence   Baseline Extended to long term goal   Time --  12/01/14   Status On-going           PT Long Term Goals - 12/11/14 1539    PT LONG TERM GOAL #1   Title Verbalize understanding of fall prevention strategies within home environment   Baseline to be met by 12/01/2014   Time --  12/01/14   Status On-going   PT LONG TERM GOAL #2   Title To be met by 01/05/15: increase BERG score to 46/56 for decreased fall risk   Time --  12/01/14   Status On-going  scored 45/56 on 12/11/14   PT LONG TERM GOAL #3   Title Decrease TUG time to 24 seconds with LRAD for improved effieciency of functional mobility   Time --  12/01/14   Status Achieved   PT LONG TERM GOAL #4   Title Sit to stand without UE support 3/3x without retropulsion for increased safety, independence and efficiency with functional mobiltiy   Time --  12/01/14   Status Achieved   PT LONG TERM GOAL #5   Title To be met by 01/05/15: Ambulate 1000' with cane outside on concrete with supervision for decreased fall risk and increased community access   Time --  12/01/14   Status On-going   Additional Long Term Goals   Additional Long Term Goals Yes   PT LONG TERM GOAL #6   Title To be met by 02/06/15: FOTO-Pt to increase activities balance confidence test score to 20.6% for improved balance confidence.   Time --  12/01/14   Status On-going   PT LONG TERM GOAL #7   Title To be met by 01/05/15: Pt to increase 6 minute walk test distance to 688' for improved endurance and activity tolerance.   Time --  12/01/14   Status On-going   PT LONG TERM GOAL #8   Title Ambulate 500' with cane on indoor, level surfaces for improved endurance and independence   Time --  12/01/14   Status Achieved  Achieved on 12/08/14               Plan - 12/25/14 1608     Clinical Impression Statement Pt demonstrated progress as he was able to tolerate ambulate for 10 minutes without a rest break during that time. However, pt did require approx. 5 minute rest break after that bout of ambulation due to fatigue. Pt also demonstrating improved strength, as he was able to progress theraband resistance for HEP. Continue with POC.   Pt will benefit from skilled therapeutic intervention in  order to improve on the following deficits Abnormal gait;Decreased endurance;Impaired flexibility;Decreased balance;Decreased mobility;Decreased strength;Decreased knowledge of use of DME   Rehab Potential Good   PT Frequency 2x / week   PT Duration 8 weeks   PT Treatment/Interventions ADLs/Self Care Home Management;Gait training;Neuromuscular re-education;Stair training;Functional mobility training;Patient/family education;Therapeutic activities;Therapeutic exercise;Manual techniques;Balance training;DME Instruction   PT Next Visit Plan Gait training with pt's 3-pronged cane or quad cane, dynamic balance training   Consulted and Agree with Plan of Care Patient        Problem List Patient Active Problem List   Diagnosis Date Noted  . Sinus bradycardia 09/21/2014  . Thoracic spondylosis with myelopathy 09/13/2014  . Acute diastolic heart failure 82/41/7530  . Cervical arthritis with myelopathy 07/18/2014  . Constipation - functional   . CAP (community acquired pneumonia) 07/10/2014  . Sepsis 07/10/2014  . HTN (hypertension) 07/10/2014  . OSA on CPAP 07/10/2014  . UTI (lower urinary tract infection) 07/10/2014  . Edema leg 07/10/2014  . Acute respiratory failure with hypoxia 07/10/2014  . 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 Other vitamin B12 deficiency anemia 07/13/2013  . 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 Polyneuropathy in other diseases classified elsewhere 02/17/2013  . Other general symptoms(780.99) 02/17/2013  . Abnormality of  gait 02/17/2013  . Cervical spondylosis with myelopathy 02/17/2013    Paiton Boultinghouse L 12/25/2014, 4:13 PM  [facility] [address] [place], [place], [address] Phone: [phone]   Fax:  [phone]     Geoffry Paradise, PT,DPT 12/25/2014 4:13 PM Phone: [phone] Fax: [phone]

## 2014-12-25 NOTE — Patient Instructions (Signed)
"  [employer] love a [employer] worker, community along the counter for 15 steps. Repeat _4___ times. Do __1__ sessions per day.  http://gt2.exer.us/344   Copyright  VHI. All rights reserved.   Back Wall Slide   With feet _8___ inches from wall, lean as much of back against the wall as possible. Gently squat halfway down, keeping back against wall. Hold _10___ seconds while counting out loud. Repeat _10___ times. Do _1___ sessions per day.  http://gt2.exer.us/563   Copyright  VHI. All rights reserved.    **Use blue theraband instead of green band for ankle dorsiflexion (toes towards nose exercise) and for seated hip abduction exercise.  **Tie yellow theraband around ankles for sidestepping at counter.  Backward   Walk backwards with eyes open, while holding onto counter with one hand. Take 10-15 even steps, making sure each foot lifts off floor. Repeat 4 times. Do _1___ sessions per day.  Copyright  VHI. All rights reserved.

## 2014-12-28 ENCOUNTER — Ambulatory Visit: Payer: Medicare Other

## 2014-12-28 DIAGNOSIS — Z5189 Encounter for other specified aftercare: Secondary | ICD-10-CM | POA: Diagnosis not present

## 2014-12-28 DIAGNOSIS — R262 Difficulty in walking, not elsewhere classified: Secondary | ICD-10-CM

## 2014-12-28 DIAGNOSIS — R269 Unspecified abnormalities of gait and mobility: Secondary | ICD-10-CM

## 2014-12-28 DIAGNOSIS — R279 Unspecified lack of coordination: Secondary | ICD-10-CM

## 2014-12-28 NOTE — Therapy (Signed)
[facility] [address] [place], [place], [address] Phone: [phone]   Fax:  [phone]  Physical Therapy Treatment  Patient Details  Name: Travis [name] MRN: [id number] Date of Birth: 12-24-1946  Encounter Date: 12/28/2014      PT End of Session - 12/28/14 1401    Visit Number 16   Number of Visits 29   Date for PT Re-Evaluation 02/06/15   Authorization Type Medicare-needs G codes every 10th visit   PT Start Time 1318   PT Stop Time 1400   PT Time Calculation (min) 42 min      Past Medical History  Diagnosis Date  . PONV (postoperative nausea and vomiting)   . Hypertension   . DJD (degenerative joint disease)     WHOLE SPINE  . Hyperthyroidism     Had Iodine to resolve issue DX 10 YR AGO  . Hypothyroidism     Low d/t treatment of hyperthyroidism  . Sleep apnea     DX  2009/2010 study done at [facility]  . Type II diabetes mellitus     diet controlled  . Heart murmur     Evaluated by cardiologist 4-5 years ago but does not currently see a cardiologist  . Gait disorder   . Cervical myelopathy     with myelomalacia at C5-6 and C3-4  . RLS (restless legs syndrome)   . Peripheral neuropathy   . 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 Constipation - functional     controlled with miralax  . RBBB   . 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 CHF (congestive heart failure)   . Pneumonia     Past Surgical History  Procedure Laterality Date  . Cholecystectomy    . Back surgery      6 surgeries  . Lumbar laminectomy/decompression microdiscectomy  07/14/2012    Procedure: LUMBAR LAMINECTOMY/DECOMPRESSION MICRODISCECTOMY 2 LEVELS;  Surgeon: Eustace Moore, MD;  Location: [place] [facility];  Service: Neurosurgery;  Laterality: Left;  Lumbar two three laminectomy, left thoracic four five transpedicular diskectomy    There were no vitals taken for this visit.  Visit Diagnosis:  Difficulty in  walking  Abnormality of gait  Lack of coordination      Subjective Assessment - 12/28/14 1322    Symptoms Pt denied falls or changes since last visit. Pt reported he is feeling well today.   Currently in Pain? No/denies     Lateral walking 4 laps in parallel bars with verbal cues for hip internal rotation for hip abductor strength, then 4 laps without UE support with CGA for balance training.  Seated hip abductor strengthening with blue theraband resistance 3x20  Stepping forward and backward over 2"x4" obstacle with tactile cues to decrease forward lean compensation when backward stepping   Single leg stance with contralateral LE rolling ball, then with contralateral LE static on ball with clapping hands in front, behind and overhead, then with turning to look over shoulder. Performed on each leg all with CGA and no UE support.                              PT Short Term Goals - 11/27/14 1604    PT SHORT TERM GOAL #5   Title Ambulate 500' with cane on indoor, level surfaces for improved endurance and independence   Baseline Extended to long term goal   Time --  12/01/14   Status On-going           PT Long Term Goals - 12/11/14 1539    PT LONG TERM GOAL #1   Title Verbalize understanding of fall prevention strategies within home environment   Baseline to be met by 12/01/2014   Time --  12/01/14   Status On-going   PT LONG TERM GOAL #2   Title To be met by 01/05/15: increase BERG score to 46/56 for decreased fall risk   Time --  12/01/14   Status On-going  scored 45/56 on 12/11/14   PT LONG TERM GOAL #3   Title Decrease TUG time to 24 seconds with LRAD for improved effieciency of functional mobility   Time --  12/01/14   Status Achieved   PT LONG TERM GOAL #4   Title Sit to stand without UE support 3/3x without retropulsion for increased safety, independence and efficiency with functional mobiltiy   Time --  12/01/14   Status Achieved   PT LONG  TERM GOAL #5   Title To be met by 01/05/15: Ambulate 1000' with cane outside on concrete with supervision for decreased fall risk and increased community access   Time --  12/01/14   Status On-going   Additional Long Term Goals   Additional Long Term Goals Yes   PT LONG TERM GOAL #6   Title To be met by 02/06/15: FOTO-Pt to increase activities balance confidence test score to 20.6% for improved balance confidence.   Time --  12/01/14   Status On-going   PT LONG TERM GOAL #7   Title To be met by 01/05/15: Pt to increase 6 minute walk test distance to 688' for improved endurance and activity tolerance.   Time --  12/01/14   Status On-going   PT LONG TERM GOAL #8   Title Ambulate 500' with cane on indoor, level surfaces for improved endurance and independence   Time --  12/01/14   Status Achieved  Achieved on 12/08/14               Problem List Patient Active Problem List   Diagnosis Date Noted  . Sinus bradycardia 09/21/2014  . Thoracic spondylosis with myelopathy 09/13/2014  . Acute diastolic heart failure 73/56/7014  . Cervical arthritis with myelopathy 07/18/2014  . Constipation - functional   . CAP (community acquired pneumonia) 07/10/2014  . Sepsis 07/10/2014  . HTN (hypertension) 07/10/2014  . OSA on CPAP 07/10/2014  . UTI (lower urinary tract infection) 07/10/2014  . Edema leg 07/10/2014  . Acute respiratory failure with hypoxia 07/10/2014  . 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 Other vitamin B12 deficiency anemia 07/13/2013  . 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 Polyneuropathy in other diseases classified elsewhere 02/17/2013  . Other general symptoms(780.99) 02/17/2013  . Abnormality of gait 02/17/2013  . Cervical spondylosis with myelopathy 02/17/2013    Delrae Sawyers D 12/28/2014, 4:02 PM  [facility] [address] [place], [place], [address] Phone:  [phone]   Fax:  [phone]

## 2014-12-30 ENCOUNTER — Other Ambulatory Visit: Payer: Self-pay | Admitting: Physical Medicine and Rehabilitation

## 2015-01-01 ENCOUNTER — Ambulatory Visit: Payer: Medicare Other | Attending: Neurology

## 2015-01-01 DIAGNOSIS — M4714 Other spondylosis with myelopathy, thoracic region: Secondary | ICD-10-CM | POA: Diagnosis not present

## 2015-01-01 DIAGNOSIS — M4712 Other spondylosis with myelopathy, cervical region: Secondary | ICD-10-CM | POA: Diagnosis not present

## 2015-01-01 DIAGNOSIS — R262 Difficulty in walking, not elsewhere classified: Secondary | ICD-10-CM

## 2015-01-01 DIAGNOSIS — M4806 Spinal stenosis, lumbar region: Secondary | ICD-10-CM | POA: Insufficient documentation

## 2015-01-01 DIAGNOSIS — R279 Unspecified lack of coordination: Secondary | ICD-10-CM | POA: Diagnosis not present

## 2015-01-01 DIAGNOSIS — Z5189 Encounter for other specified aftercare: Secondary | ICD-10-CM | POA: Insufficient documentation

## 2015-01-01 DIAGNOSIS — R269 Unspecified abnormalities of gait and mobility: Secondary | ICD-10-CM

## 2015-01-01 NOTE — Patient Instructions (Signed)
Gluteus (Supine)   Lay on bed. Cross left ankle over right knee. Gently push left knee away from your body until a stretch is felt in the buttock. Hold 30 seconds and perform 3x daily.  **the picture shows the foot off of the bed. When you perform this, keep the right foot on the bed.   http://orth.exer.us/676   Copyright  VHI. All rights reserved.   Hip Flexor Stretch   Lying on back near edge of bed, bend one leg, foot flat. Hang other leg over edge, relaxed, bend knee until a gentle stretch is felt. Hold 30 seconds then relax 10 seconds and repeat 3 x on each leg.   http://gt2.exer.us/346   Copyright  VHI. All rights reserved.    IT Band: Wall Lean With Crossed Leg   Stand with hands on walker or right hand on wall. Cross left leg behind right leg.  Bend upper body toward the right with left hip jutted toward the left.   Hold 30 seconds. Relax. Repeat 3 times. Do 2 times a day.   Copyright  VHI. All rights reserved.

## 2015-01-02 NOTE — Therapy (Signed)
[facility] [address] [place], [place], [address] Phone: [phone]   Fax:  [phone]  Physical Therapy Treatment  Patient Details  Name: Travis [name] MRN: [id number] Date of Birth: 02-17-1946  Encounter Date: 01/01/2015      PT End of Session - 01/01/15 1403    Visit Number 17   Number of Visits 29   Date for PT Re-Evaluation 02/06/15   Authorization Type Medicare-needs G codes every 10th visit   PT Start Time 1320   PT Stop Time 1400   PT Time Calculation (min) 40 min      Past Medical History  Diagnosis Date  . PONV (postoperative nausea and vomiting)   . Hypertension   . DJD (degenerative joint disease)     WHOLE SPINE  . Hyperthyroidism     Had Iodine to resolve issue DX 10 YR AGO  . Hypothyroidism     Low d/t treatment of hyperthyroidism  . Sleep apnea     DX  2009/2010 study done at [facility]  . Type II diabetes mellitus     diet controlled  . Heart murmur     Evaluated by cardiologist 4-5 years ago but does not currently see a cardiologist  . Gait disorder   . Cervical myelopathy     with myelomalacia at C5-6 and C3-4  . RLS (restless legs syndrome)   . Peripheral neuropathy   . 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 Constipation - functional     controlled with miralax  . RBBB   . 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 CHF (congestive heart failure)   . Pneumonia     Past Surgical History  Procedure Laterality Date  . Cholecystectomy    . Back surgery      6 surgeries  . Lumbar laminectomy/decompression microdiscectomy  07/14/2012    Procedure: LUMBAR LAMINECTOMY/DECOMPRESSION MICRODISCECTOMY 2 LEVELS;  Surgeon: Eustace Moore, MD;  Location: [facility];  Service: Neurosurgery;  Laterality: Left;  Lumbar two three laminectomy, left thoracic four five transpedicular diskectomy    There were no vitals taken for this visit.  Visit Diagnosis:  Difficulty in  walking  Abnormality of gait  Lack of coordination      Subjective Assessment - 01/01/15 1325    Symptoms Been having some soreness down the side of the left leg this morning   Currently in Pain? Yes   Pain Score 4    Pain Location Leg   Pain Orientation Left;Lateral      Therex: Due to pt reporting LLE pain consistent with IT band syndrome, this was addressed in therapy today.  -Passive prone quadriceps stretch 4x30 seconds each leg -hooklying gluteus maximus stretch 4x30 seconds each leg -discussed benefit of seated hamstring stretch which pt is already aware of how to perform -standing IT band stretch with BUE support on chair with therapist explanation and demonstration 4x30 seconds -one repetition of supine hip flexor stretch -added to HEP -trialed seated gluteus maximus stretch but ineffective as pt cannot properly attain position -Clamshells 3x10 each LE with instructions and tactile cues for correct form -discussed benefit of sleeping on side with pillow between knees to decrease pressure/friction on IT band, and discussed importance of proper support in bed (pt reports slumping in-between the two tempurpedic mattresses while in sidelying which further affects symptoms.  PT Education - 01/01/15 1402    Education provided Yes   Education Details sleeping posture/use of pillow; HEP   Person(s) Educated Patient   Methods Explanation;Demonstration;Handout   Comprehension Verbalized understanding;Returned demonstration          PT Short Term Goals - 11/27/14 1604    PT SHORT TERM GOAL #5   Title Ambulate 500' with cane on indoor, level surfaces for improved endurance and independence   Baseline Extended to long term goal   Time --  12/01/14   Status On-going           PT Long Term Goals - 12/11/14 1539    PT LONG TERM GOAL #1   Title Verbalize understanding of fall prevention strategies within home environment   Baseline  to be met by 12/01/2014   Time --  12/01/14   Status On-going   PT LONG TERM GOAL #2   Title To be met by 01/05/15: increase BERG score to 46/56 for decreased fall risk   Time --  12/01/14   Status On-going  scored 45/56 on 12/11/14   PT LONG TERM GOAL #3   Title Decrease TUG time to 24 seconds with LRAD for improved effieciency of functional mobility   Time --  12/01/14   Status Achieved   PT LONG TERM GOAL #4   Title Sit to stand without UE support 3/3x without retropulsion for increased safety, independence and efficiency with functional mobiltiy   Time --  12/01/14   Status Achieved   PT LONG TERM GOAL #5   Title To be met by 01/05/15: Ambulate 1000' with cane outside on concrete with supervision for decreased fall risk and increased community access   Time --  12/01/14   Status On-going   Additional Long Term Goals   Additional Long Term Goals Yes   PT LONG TERM GOAL #6   Title To be met by 02/06/15: FOTO-Pt to increase activities balance confidence test score to 20.6% for improved balance confidence.   Time --  12/01/14   Status On-going   PT LONG TERM GOAL #7   Title To be met by 01/05/15: Pt to increase 6 minute walk test distance to 688' for improved endurance and activity tolerance.   Time --  12/01/14   Status On-going   PT LONG TERM GOAL #8   Title Ambulate 500' with cane on indoor, level surfaces for improved endurance and independence   Time --  12/01/14   Status Achieved  Achieved on 12/08/14               Plan - 01/01/15 1403    Clinical Impression Statement Pt presenting with lateral left leg pain; symptoms consistent with IT band syndrome. Pt is now aware of reason for this pain and means to decrease it and prevent it.   PT Next Visit Plan Check appropriate LTGs Gait training with pt's 3-pronged cane or quad cane, dynamic balance training        Problem List Patient Active Problem List   Diagnosis Date Noted  . Sinus bradycardia 09/21/2014  . Thoracic  spondylosis with myelopathy 09/13/2014  . Acute diastolic heart failure 74/25/9563  . Cervical arthritis with myelopathy 07/18/2014  . Constipation - functional   . CAP (community acquired pneumonia) 07/10/2014  . Sepsis 07/10/2014  . HTN (hypertension) 07/10/2014  . OSA on CPAP 07/10/2014  . UTI (lower urinary tract infection) 07/10/2014  . Edema leg 07/10/2014  . Acute respiratory failure with hypoxia  07/10/2014  . 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 Other vitamin B12 deficiency anemia 07/13/2013  . 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 Polyneuropathy in other diseases classified elsewhere 02/17/2013  . Other general symptoms(780.99) 02/17/2013  . Abnormality of gait 02/17/2013  . Cervical spondylosis with myelopathy 02/17/2013    Delrae Sawyers D 01/02/2015, 7:48 AM  [facility] [address] [place], [place], [address] Phone: [phone]   Fax:  [phone]

## 2015-01-05 ENCOUNTER — Encounter (HOSPITAL_BASED_OUTPATIENT_CLINIC_OR_DEPARTMENT_OTHER): Payer: Self-pay | Admitting: Emergency Medicine

## 2015-01-05 ENCOUNTER — Emergency Department (HOSPITAL_BASED_OUTPATIENT_CLINIC_OR_DEPARTMENT_OTHER)
Admission: EM | Admit: 2015-01-05 | Discharge: 2015-01-05 | Disposition: A | Discharge status: Home / Self Care | Payer: Medicare Other | Attending: Emergency Medicine | Admitting: Emergency Medicine

## 2015-01-05 ENCOUNTER — Ambulatory Visit: Payer: Medicare Other

## 2015-01-05 ENCOUNTER — Emergency Department (HOSPITAL_BASED_OUTPATIENT_CLINIC_OR_DEPARTMENT_OTHER): Payer: Medicare Other

## 2015-01-05 DIAGNOSIS — G629 Polyneuropathy, unspecified: Secondary | ICD-10-CM | POA: Insufficient documentation

## 2015-01-05 DIAGNOSIS — E0590 Thyrotoxicosis, unspecified without thyrotoxic crisis or storm: Secondary | ICD-10-CM | POA: Diagnosis not present

## 2015-01-05 DIAGNOSIS — E119 Type 2 diabetes mellitus without complications: Secondary | ICD-10-CM | POA: Diagnosis not present

## 2015-01-05 DIAGNOSIS — R262 Difficulty in walking, not elsewhere classified: Secondary | ICD-10-CM

## 2015-01-05 DIAGNOSIS — R600 Localized edema: Secondary | ICD-10-CM

## 2015-01-05 DIAGNOSIS — E039 Hypothyroidism, unspecified: Secondary | ICD-10-CM | POA: Insufficient documentation

## 2015-01-05 DIAGNOSIS — R279 Unspecified lack of coordination: Secondary | ICD-10-CM

## 2015-01-05 DIAGNOSIS — G2581 Restless legs syndrome: Secondary | ICD-10-CM | POA: Diagnosis not present

## 2015-01-05 DIAGNOSIS — R269 Unspecified abnormalities of gait and mobility: Secondary | ICD-10-CM

## 2015-01-05 DIAGNOSIS — Z791 Long term (current) use of non-steroidal anti-inflammatories (NSAID): Secondary | ICD-10-CM | POA: Diagnosis not present

## 2015-01-05 DIAGNOSIS — K5909 Other constipation: Secondary | ICD-10-CM | POA: Diagnosis not present

## 2015-01-05 DIAGNOSIS — I10 Essential (primary) hypertension: Secondary | ICD-10-CM | POA: Diagnosis not present

## 2015-01-05 DIAGNOSIS — Z8701 Personal history of pneumonia (recurrent): Secondary | ICD-10-CM | POA: Insufficient documentation

## 2015-01-05 DIAGNOSIS — I509 Heart failure, unspecified: Secondary | ICD-10-CM | POA: Diagnosis not present

## 2015-01-05 DIAGNOSIS — R011 Cardiac murmur, unspecified: Secondary | ICD-10-CM | POA: Diagnosis not present

## 2015-01-05 DIAGNOSIS — M79662 Pain in left lower leg: Secondary | ICD-10-CM | POA: Diagnosis present

## 2015-01-05 DIAGNOSIS — R609 Edema, unspecified: Secondary | ICD-10-CM

## 2015-01-05 DIAGNOSIS — Z87891 Personal history of nicotine dependence: Secondary | ICD-10-CM | POA: Diagnosis not present

## 2015-01-05 DIAGNOSIS — Z5189 Encounter for other specified aftercare: Secondary | ICD-10-CM | POA: Diagnosis not present

## 2015-01-05 DIAGNOSIS — R52 Pain, unspecified: Secondary | ICD-10-CM

## 2015-01-05 LAB — BASIC METABOLIC PANEL
ANION GAP: 6 (ref 5–15)
BUN: 32 mg/dL — ABNORMAL HIGH (ref 6–23)
CHLORIDE: 105 meq/L (ref 96–112)
CO2: 29 mmol/L (ref 19–32)
CREATININE: 1.07 mg/dL (ref 0.50–1.35)
Calcium: 9.1 mg/dL (ref 8.4–10.5)
GFR, EST AFRICAN AMERICAN: 79 mL/min — AB (ref >90)
GFR, EST NON AFRICAN AMERICAN: 68 mL/min — AB (ref >90)
Glucose, Bld: 100 mg/dL — ABNORMAL HIGH (ref 70–99)
POTASSIUM: 3.9 mmol/L (ref 3.5–5.1)
Sodium: 140 mmol/L (ref 135–145)

## 2015-01-05 NOTE — Therapy (Addendum)
[facility] [address] [place], [place], [address] Phone: [phone]   Fax:  [phone]  Physical Therapy Treatment  Patient Details  Name: Travis [name] MRN: [id number] Date of Birth: 15-Nov-1946 Referring Provider:  [name], MD  Encounter Date: 01/05/2015      PT End of Session - 01/05/15 1609    Visit Number 18   Number of Visits 29   Date for PT Re-Evaluation 02/06/15   Authorization Type Medicare-needs G codes every 10th visit   PT Start Time 1532   PT Stop Time 1605   PT Time Calculation (min) 33 min   Equipment Utilized During Treatment Gait belt   Activity Tolerance Patient limited by pain   Behavior During Therapy [facility] for tasks assessed/performed      Past Medical History  Diagnosis Date  . PONV (postoperative nausea and vomiting)   . Hypertension   . DJD (degenerative joint disease)     WHOLE SPINE  . Hyperthyroidism     Had Iodine to resolve issue DX 10 YR AGO  . Hypothyroidism     Low d/t treatment of hyperthyroidism  . Sleep apnea     DX  2009/2010 study done at [facility]  . Type II diabetes mellitus     diet controlled  . Heart murmur     Evaluated by cardiologist 4-5 years ago but does not currently see a cardiologist  . Gait disorder   . Cervical myelopathy     with myelomalacia at C5-6 and C3-4  . RLS (restless legs syndrome)   . Peripheral neuropathy   . 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 Constipation - functional     controlled with miralax  . RBBB   . 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 CHF (congestive heart failure)   . Pneumonia     Past Surgical History  Procedure Laterality Date  . Cholecystectomy    . Back surgery      6 surgeries  . Lumbar laminectomy/decompression microdiscectomy  07/14/2012    Procedure: LUMBAR LAMINECTOMY/DECOMPRESSION MICRODISCECTOMY 2 LEVELS;  Surgeon: Eustace Moore, MD;  Location: [place] [facility];  Service: Neurosurgery;   Laterality: Left;  Lumbar two three laminectomy, left thoracic four five transpedicular diskectomy    There were no vitals taken for this visit.  Visit Diagnosis:  Difficulty in walking  Abnormality of gait  Lack of coordination      Subjective Assessment - 01/05/15 1536    Symptoms Pt denied falls since last visit. Pt reported he is still having L LE pain starting at knee and travels distally to ankle and he denies doing anything differently.    Currently in Pain? Yes   Pain Score 2   increases to 9/10 with standing   Pain Location Leg   Pain Orientation Left;Lateral   Pain Descriptors / Indicators Aching   Pain Type Acute pain   Pain Radiating Towards travels from knee to ankle   Pain Onset In the past 7 days   Pain Frequency Constant   Aggravating Factors  standing    Pain Relieving Factors sitting        PT examined B LEs and noted pt had increased B LE edema and tenderness along lateral soleus and fibularis longus (approx. 2-3 inches distal to fibular head).            [facility] Adult PT Treatment/Exercise - 01/05/15 1542    Standardized Balance Assessment  Standardized Balance Assessment Berg Balance Test   Berg Balance Test   Sit to Stand Able to stand  independently using hands   Standing Unsupported Able to stand safely 2 minutes   Sitting with Back Unsupported but Feet Supported on Floor or Stool Able to sit safely and securely 2 minutes   Stand to Sit Controls descent by using hands   Transfers Able to transfer safely, definite need of hands   Standing Unsupported with Eyes Closed Able to stand 10 seconds safely   Standing Ubsupported with Feet Together Able to place feet together independently and stand for 1 minute with supervision  had to cease BERG to L LE pain                PT Education - 01/05/15 1609    Education provided Yes   Education Details PT encouraged pt to call MD after leaving PT to inform MD about severe leg pain while  standing and increased edema. PT also encouraged pt to reduce sodium intake, elevate legs, and drink water to decrease swelling.   Person(s) Educated Patient   Methods Explanation   Comprehension Verbalized understanding          PT Short Term Goals - 11/27/14 1604    PT SHORT TERM GOAL #5   Title Ambulate 500' with cane on indoor, level surfaces for improved endurance and independence   Baseline Extended to long term goal   Time --  12/01/14   Status On-going           PT Long Term Goals - 12/11/14 1539    PT LONG TERM GOAL #1   Title Verbalize understanding of fall prevention strategies within home environment   Baseline to be met by 12/01/2014   Time --  12/01/14   Status On-going   PT LONG TERM GOAL #2   Title To be met by 01/05/15: increase BERG score to 46/56 for decreased fall risk   Time --  12/01/14   Status On-going  scored 45/56 on 12/11/14   PT LONG TERM GOAL #3   Title Decrease TUG time to 24 seconds with LRAD for improved effieciency of functional mobility   Time --  12/01/14   Status Achieved   PT LONG TERM GOAL #4   Title Sit to stand without UE support 3/3x without retropulsion for increased safety, independence and efficiency with functional mobiltiy   Time --  12/01/14   Status Achieved   PT LONG TERM GOAL #5   Title To be met by 01/05/15: Ambulate 1000' with cane outside on concrete with supervision for decreased fall risk and increased community access   Time --  12/01/14   Status On-going   Additional Long Term Goals   Additional Long Term Goals Yes   PT LONG TERM GOAL #6   Title To be met by 02/06/15: FOTO-Pt to increase activities balance confidence test score to 20.6% for improved balance confidence.   Time --  12/01/14   Status On-going   PT LONG TERM GOAL #7   Title To be met by 01/05/15: Pt to increase 6 minute walk test distance to 688' for improved endurance and activity tolerance.   Time --  12/01/14   Status On-going   PT LONG TERM GOAL #8    Title Ambulate 500' with cane on indoor, level surfaces for improved endurance and independence   Time --  12/01/14   Status Achieved  Achieved on 12/08/14  Plan - 01/05/15 1610    Clinical Impression Statement Pt very limited by L lateral leg pain (knee to ankle), as pain increased to 9/10 with weight bearing. Pt and PT agreed to stop session early due to leg pain, as pt was unable to tolerate standing without increase in pain. PT strongly encouraged pt to inform MD. Pt would continue to benefit from skilled PT to improve safety during functional mboilty.   Pt will benefit from skilled therapeutic intervention in order to improve on the following deficits Abnormal gait;Decreased endurance;Impaired flexibility;Decreased balance;Decreased mobility;Decreased strength;Decreased knowledge of use of DME   Rehab Potential Good   PT Frequency 2x / week   PT Duration 8 weeks   PT Treatment/Interventions ADLs/Self Care Home Management;Gait training;Neuromuscular re-education;Stair training;Functional mobility training;Patient/family education;Therapeutic activities;Therapeutic exercise;Manual techniques;Balance training;DME Instruction   PT Next Visit Plan Ask pt what MD said about L LE pain and increased swelling. Check appropriate LTGs.        Problem List Patient Active Problem List   Diagnosis Date Noted  . Sinus bradycardia 09/21/2014  . Thoracic spondylosis with myelopathy 09/13/2014  . Acute diastolic heart failure 76/14/7092  . Cervical arthritis with myelopathy 07/18/2014  . Constipation - functional   . CAP (community acquired pneumonia) 07/10/2014  . Sepsis 07/10/2014  . HTN (hypertension) 07/10/2014  . OSA on CPAP 07/10/2014  . UTI (lower urinary tract infection) 07/10/2014  . Edema leg 07/10/2014  . Acute respiratory failure with hypoxia 07/10/2014  . 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 Other vitamin B12 deficiency anemia 07/13/2013  . 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 Polyneuropathy in other diseases classified elsewhere 02/17/2013  . Other general symptoms(780.99) 02/17/2013  . Abnormality of gait 02/17/2013  . Cervical spondylosis with myelopathy 02/17/2013    , L 01/05/2015, 4:19 PM  [facility] [address] [place], [place], [address] Phone: [phone]   Fax:  [phone]     Geoffry Paradise, PT,DPT 01/05/2015 4:19 PM Phone: [phone] Fax: [phone]

## 2015-01-05 NOTE — ED Notes (Signed)
Pt was referred to er by dr. Maceo Pro, pt with left leg pain x 1 week, today noticed swelling and increased pain to left calf

## 2015-01-05 NOTE — ED Provider Notes (Signed)
CSN: [id number]     Arrival date & time 01/05/15  1901 History   First MD Initiated Contact with Patient 01/05/15 2106     Chief Complaint  Patient presents with  . Leg Pain     (Consider location/radiation/quality/duration/timing/severity/associated sxs/prior Treatment) Patient is a 69 y.o. male presenting with leg pain. The history is provided by the patient. No language interpreter was used.  Leg Pain Location:  Leg Leg location:  R lower leg and L lower leg Pain details:    Quality:  Pressure   Duration:  1 week   Progression:  Waxing and waning Chronicity:  New Associated symptoms: no fever     Past Medical History  Diagnosis Date  . PONV (postoperative nausea and vomiting)   . Hypertension   . DJD (degenerative joint disease)     WHOLE SPINE  . Hyperthyroidism     Had Iodine to resolve issue DX 10 YR AGO  . Hypothyroidism     Low d/t treatment of hyperthyroidism  . Sleep apnea     DX  2009/2010 [facility] done at [facility]  . Type II diabetes mellitus     diet controlled  . Heart murmur     Evaluated by cardiologist 4-5 years ago but does not currently see a cardiologist  . Gait disorder   . Cervical myelopathy     with myelomalacia at C5-6 and C3-4  . RLS (restless legs syndrome)   . Peripheral neuropathy   . 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 Constipation - functional     controlled with miralax  . RBBB   . 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 CHF (congestive heart failure)   . Pneumonia    Past Surgical History  Procedure Laterality Date  . Cholecystectomy    . Back surgery      6 surgeries  . Lumbar laminectomy/decompression microdiscectomy  07/14/2012    Procedure: LUMBAR LAMINECTOMY/DECOMPRESSION MICRODISCECTOMY 2 LEVELS;  Surgeon: Eustace Moore, MD;  Location: [place] [facility];  Service: Neurosurgery;  Laterality: Left;  Lumbar two three laminectomy, left thoracic four five transpedicular diskectomy   Family History  Problem Relation Age of Onset   . Polycythemia Mother   . Diabetes Father   . Hypertension Father   . Heart disease Father   . CAD      1 of his 4 brothers and father has CAD   History  Substance Use Topics  . Smoking status: Former Smoker -- 2.00 packs/day for 20 years    Types: Cigarettes    Quit date: 06/28/1985  . Smokeless tobacco: Former Systems developer    Quit date: 06/28/1985  . Alcohol Use: No    Review of Systems  Constitutional: Negative for fever.  Respiratory: Negative for cough and shortness of breath.   Cardiovascular: Positive for leg swelling. Negative for chest pain.  Neurological: Negative for weakness and headaches.  All other systems reviewed and are negative.     Allergies  Other  Home Medications   Prior to Admission medications   Medication Sig Start Date End Date Taking? Authorizing Provider  baclofen (LIORESAL) 10 MG tablet Take 1 tablet (10 mg total) by mouth 3 (three) times daily. For muscle spasms 10/30/14  Yes Meredith Staggers, MD  ciclopirox [facility]) 8 % solution Apply topically at bedtime.  09/05/14  Yes Historical Provider, MD  diclofenac (VOLTAREN) 0.1 % ophthalmic solution 4 (four) times daily.   Yes Historical Provider, MD  escitalopram (LEXAPRO)  5 MG tablet Take 1 tablet (5 mg total) by mouth at bedtime. Patient taking differently: Take 10 mg by mouth at bedtime.  08/09/14  Yes Ivan Anchors Love, [name]  furosemide (LASIX) 40 MG tablet TAKE A WHOLE PILL WITH BREAKFAST AND TAKE 1/2 A PILL AFTER LUNCH 10/25/14  Yes [name], MD  gabapentin (NEURONTIN) 400 MG capsule TAKE 1 CAPSULE (400 MG TOTAL) BY MOUTH EVERY 8 (EIGHT) HOURS. 11/22/14  Yes Meredith Staggers, MD  glimepiride (AMARYL) 2 MG tablet Take 2 mg by mouth daily with breakfast.   Yes Historical Provider, MD  levothyroxine (SYNTHROID, LEVOTHROID) 150 MCG tablet Take 175 mcg by mouth daily.    Yes Historical Provider, MD  losartan (COZAAR) 100 MG tablet Take 1 tablet (100 mg total) by mouth daily. 12/08/14  Yes [name],  MD  polyethylene glycol [facility] Floria Raveling) packet Take 17 g by mouth daily as needed. For constipation 08/09/14  Yes Ivan Anchors Love, [name]  potassium chloride (K-DUR) 10 MEQ tablet Take 1 tablet (10 mEq total) by mouth daily. 09/07/14  Yes [name], MD  tamsulosin (FLOMAX) 0.4 MG CAPS capsule Take 0.4 mg by mouth daily.  09/13/14  Yes Historical Provider, MD  pramipexole (MIRAPEX) 0.5 MG tablet Take 0.125 mg by mouth 2 (two) times daily. 08/09/14   Ivan Anchors Love, [name]   BP 197/75 mmHg  Pulse 53  Temp(Src) 98 F (36.7 C) (Oral)  Resp 16  Ht 6\' 1"  (1.854 m)  Wt 274 lb (124.286 kg)  BMI 36.16 kg/m2  SpO2 100% Physical Exam  Constitutional: He is oriented to person, place, and time. He appears well-developed and well-nourished.  HENT:  Head: Normocephalic.  Neck: Neck supple. No JVD present.  Cardiovascular: Normal rate and regular rhythm.   Pulmonary/Chest: Effort normal. He has no rales.  Abdominal: Soft.  Musculoskeletal: He exhibits edema and tenderness.  2+ edema to lower extremities bilaterally  Neurological: He is alert and oriented to person, place, and time.  Skin: Skin is warm and dry.  Psychiatric: He has a normal mood and affect.  Nursing note and vitals reviewed.   ED Course  Procedures (including critical care time) Labs Review Labs Reviewed  BASIC METABOLIC PANEL - Abnormal; Notable for the following:    Glucose, Bld 100 (*)    BUN 32 (*)    GFR calc non Af Amer 68 (*)    GFR calc Af Amer 79 (*)    All other components within normal limits    Imaging Review US Venous Img Lower Bilateral  01/05/2015   CLINICAL DATA:  Bilateral lower extremity swelling since this morning, left calf pain 1 week, personal history of varicose veins  EXAM: BILATERAL LOWER EXTREMITY VENOUS DOPPLER ULTRASOUND  TECHNIQUE: Gray-scale sonography with graded compression, as well as color Doppler and duplex ultrasound were performed to evaluate the lower extremity deep venous systems from  the level of the common femoral vein and including the common femoral, femoral, profunda femoral, popliteal and calf veins including the posterior tibial, peroneal and gastrocnemius veins when visible. The superficial great saphenous vein was also interrogated. Spectral Doppler was utilized to evaluate flow at rest and with distal augmentation maneuvers in the common femoral, femoral and popliteal veins.  COMPARISON:  None.  FINDINGS: RIGHT LOWER EXTREMITY  Common Femoral Vein: No evidence of thrombus. Normal compressibility, respiratory phasicity and response to augmentation.  Saphenofemoral Junction: No evidence of thrombus. Normal compressibility and flow on color Doppler imaging.  Profunda Femoral Vein: No evidence of thrombus. Normal compressibility and flow on color Doppler imaging.  Femoral Vein: No evidence of thrombus. Normal compressibility, respiratory phasicity and response to augmentation.  Popliteal Vein: No evidence of thrombus. Normal compressibility, respiratory phasicity and response to augmentation.  Calf Veins: Posterior tibial vein appears normal. Perineal vein not visualized.  Superficial Great Saphenous Vein: No evidence of thrombus. Normal compressibility and flow on color Doppler imaging.  Venous Reflux:  None.  Other Findings:  There is soft tissue edema in the calf.  LEFT LOWER EXTREMITY  Common Femoral Vein: No evidence of thrombus. Normal compressibility, respiratory phasicity and response to augmentation.  Saphenofemoral Junction: No evidence of thrombus. Normal compressibility and flow on color Doppler imaging.  Profunda Femoral Vein: No evidence of thrombus. Normal compressibility and flow on color Doppler imaging.  Femoral Vein: No evidence of thrombus. Normal compressibility, respiratory phasicity and response to augmentation.  Popliteal Vein: No evidence of thrombus. Normal compressibility, respiratory phasicity and response to augmentation.  Calf Veins: No evidence of thrombus.  Normal compressibility and flow on color Doppler imaging.  Superficial Great Saphenous Vein: No evidence of thrombus. Normal compressibility and flow on color Doppler imaging.  Venous Reflux:  None.  Other Findings:  The calf demonstrates soft tissue edema.  IMPRESSION: Soft tissue edema in the calves bilaterally. No evidence of deep venous thrombosis.   Electronically Signed   By: Skipper Cliche M.D.   On: 01/05/2015 20:27     EKG Interpretation None     Patient sent from PCP office with concern for lower extremity DVT.  Patient with history of CHF and persistent peripheral edema, but patient has had increase in swelling and leg discomfort over the last week.  Patient without chest pain or shortness of breath.  Patient reports he is taking his diuretic as prescribed, and that swelling abates during the evening/night while he has his legs elevated. Patient discussed with Dr. Lamar Blinks check BMP to ensure creatinine is not elevated.  Venous ultrasound performed this evening does not reveal DVT. Normal creatinine. Patient states he does not need anything additional for pain control.  Patient will follow-up with his PCP.  MDM   Final diagnoses:  Peripheral edema        Norman Herrlich, NP 01/05/15 [id number]  Orpah Greek, MD 01/05/15 2352

## 2015-01-05 NOTE — Discharge Instructions (Signed)
Peripheral Edema °You have swelling in your legs (peripheral edema). This swelling is due to excess accumulation of salt and water in your body. Edema may be a sign of heart, kidney or liver disease, or a side effect of a medication. It may also be due to problems in the leg veins. Elevating your legs and using special support stockings may be very helpful, if the cause of the swelling is due to poor venous circulation. Avoid long periods of standing, whatever the cause. °Treatment of edema depends on identifying the cause. Chips, pretzels, pickles and other salty foods should be avoided. Restricting salt in your diet is almost always needed. Water pills (diuretics) are often used to remove the excess salt and water from your body via urine. These medicines prevent the kidney from reabsorbing sodium. This increases urine flow. °Diuretic treatment may also result in lowering of potassium levels in your body. Potassium supplements may be needed if you have to use diuretics daily. Daily weights can help you keep track of your progress in clearing your edema. You should call your caregiver for follow up care as recommended. °SEEK IMMEDIATE MEDICAL CARE IF:  °· You have increased swelling, pain, redness, or heat in your legs. °· You develop shortness of breath, especially when lying down. °· You develop chest or abdominal pain, weakness, or fainting. °· You have a fever. °Document Released: 01/22/2005 Document Revised: 03/08/2012 Document Reviewed: 01/02/2010 °[employer]® Patient Information ©2015 [employer]. This information is not intended to replace advice given to you by your health care provider. Make sure you discuss any questions you have with your health care provider. ° °

## 2015-01-08 ENCOUNTER — Ambulatory Visit: Payer: Medicare Other

## 2015-01-08 DIAGNOSIS — Z5189 Encounter for other specified aftercare: Secondary | ICD-10-CM | POA: Diagnosis not present

## 2015-01-08 DIAGNOSIS — R269 Unspecified abnormalities of gait and mobility: Secondary | ICD-10-CM

## 2015-01-08 DIAGNOSIS — R262 Difficulty in walking, not elsewhere classified: Secondary | ICD-10-CM

## 2015-01-08 DIAGNOSIS — R279 Unspecified lack of coordination: Secondary | ICD-10-CM

## 2015-01-08 NOTE — Therapy (Signed)
[facility] [address] [place], [place], [address] Phone: [phone]   Fax:  [phone]  Physical Therapy Treatment  Patient Details  Name: Travis [name] MRN: [id number] Date of Birth: [birth year] Referring Provider:  [name], MD  Encounter Date: 01/08/2015      PT End of Session - 01/08/15 1640    Visit Number 19   Number of Visits 29   Date for PT Re-Evaluation 02/06/15   Authorization Type Medicare-needs G codes every 10th visit   PT Start Time 1447   PT Stop Time 1528   PT Time Calculation (min) 41 min   Equipment Utilized During Treatment Gait belt   Activity Tolerance Patient limited by pain   Behavior During Therapy [facility] for tasks assessed/performed      Past Medical History  Diagnosis Date  . PONV (postoperative nausea and vomiting)   . Hypertension   . DJD (degenerative joint disease)     WHOLE SPINE  . Hyperthyroidism     Had Iodine to resolve issue DX 10 YR AGO  . Hypothyroidism     Low d/t treatment of hyperthyroidism  . Sleep apnea     DX  2009/2010 study done at [facility]  . Type II diabetes mellitus     diet controlled  . Heart murmur     Evaluated by cardiologist 4-5 years ago but does not currently see a cardiologist  . Gait disorder   . Cervical myelopathy     with myelomalacia at C5-6 and C3-4  . RLS (restless legs syndrome)   . Peripheral neuropathy   . 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 Constipation - functional     controlled with miralax  . RBBB   . 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 CHF (congestive heart failure)   . Pneumonia     Past Surgical History  Procedure Laterality Date  . Cholecystectomy    . Back surgery      6 surgeries  . Lumbar laminectomy/decompression microdiscectomy  07/14/2012    Procedure: LUMBAR LAMINECTOMY/DECOMPRESSION MICRODISCECTOMY 2 LEVELS;  Surgeon: Eustace Moore, MD;  Location: [facility];  Service: Neurosurgery;   Laterality: Left;  Lumbar two three laminectomy, left thoracic four five transpedicular diskectomy    There were no vitals taken for this visit.  Visit Diagnosis:  Difficulty in walking  Abnormality of gait  Lack of coordination      Subjective Assessment - 01/08/15 1456    Symptoms Pt denied falls sincel last visit. Pt went to MD on Friday, and MD increased Lasix to decrease B LE swelling. Imaging negative for DVT and MD ruled out CHF.   Currently in Pain? Yes   Pain Score 1   1/10 at rest, 7/10 during static standing   Pain Location Leg   Pain Orientation Left   Pain Descriptors / Indicators Aching   Pain Type Acute pain   Pain Onset 1 to 4 weeks ago   Pain Frequency Constant   Aggravating Factors  standing   Pain Relieving Factors Sitting and walking                    [facility] Adult PT Treatment/Exercise - 01/08/15 1458    Ambulation/Gait   Ambulation/Gait Yes   Ambulation/Gait Assistance 5: Supervision   Ambulation/Gait Assistance Details Pt ambulated over even terrain. VC's to improve L heel strike. Pt required seated rest breaks after each bout of amb. due  to fatigue and B LE weakness. Pt reported decreased LLE pain after amb.   Ambulation Distance (Feet) --  75'x2, 200'   Assistive device Rolling walker   Gait Pattern Step-through pattern;Poor foot clearance - left;Narrow base of support;Scissoring;Decreased dorsiflexion - left;Decreased dorsiflexion - right   Standardized Balance Assessment   Standardized Balance Assessment Berg Balance Test   Berg Balance Test   Sit to Stand Able to stand  independently using hands   Standing Unsupported Able to stand safely 2 minutes   Sitting with Back Unsupported but Feet Supported on Floor or Stool Able to sit safely and securely 2 minutes   Stand to Sit Controls descent by using hands   Transfers Able to transfer safely, definite need of hands   Standing Unsupported with Eyes Closed Able to stand 10 seconds safely    Standing Ubsupported with Feet Together Able to place feet together independently and stand 1 minute safely   From Standing, Reach Forward with Outstretched Arm Can reach forward >12 cm safely (5")  7"   From Standing Position, Pick up Object from Floor Able to pick up shoe, needs supervision   From Standing Position, Turn to Look Behind Over each Shoulder Looks behind one side only/other side shows less weight shift   Turn 360 Degrees Able to turn 360 degrees safely but slowly   Standing Unsupported, Alternately Place Feet on Step/Stool Needs assistance to keep from falling or unable to try  able to tap with LLE but not RLE due to LLE pain.   Standing Unsupported, One Foot in Front Able to plae foot ahead of the other independently and hold 30 seconds   Standing on One Leg Tries to lift leg/unable to hold 3 seconds but remains standing independently   Total Score 40                PT Education - 01/08/15 1638    Education provided Yes   Education Details PT re-educated pt on elevating B LEs to reduce swelling and to perform ankle pumps. PT also encouraged pt to take frequent walking breaks after sitting for prolonged periods of time at work, as pt reported increased B LE edema with sitting and increased pain.   Person(s) Educated Patient   Methods Explanation   Comprehension Verbalized understanding          PT Short Term Goals - 11/27/14 1604    PT SHORT TERM GOAL #5   Title Ambulate 500' with cane on indoor, level surfaces for improved endurance and independence   Baseline Extended to long term goal   Time --  12/01/14   Status On-going           PT Long Term Goals - 01/08/15 1641    PT LONG TERM GOAL #1   Title Verbalize understanding of fall prevention strategies within home environment   Baseline to be met by 12/01/2014   Time --  12/01/14   Status On-going   PT LONG TERM GOAL #2   Title To be met by 01/05/15: increase BERG score to 46/56 for decreased fall  risk   Baseline 40/56 on 01/08/15   Time --  12/01/14   Status Not Met  scored 45/56 on 12/11/14   PT LONG TERM GOAL #3   Title Decrease TUG time to 24 seconds with LRAD for improved effieciency of functional mobility   Time --  12/01/14   Status Achieved   PT LONG TERM GOAL #4   Title  Sit to stand without UE support 3/3x without retropulsion for increased safety, independence and efficiency with functional mobiltiy   Time --  12/01/14   Status Achieved   PT LONG TERM GOAL #5   Title To be met by 01/05/15: Ambulate 1000' with cane outside on concrete with supervision for decreased fall risk and increased community access   Time --  12/01/14   Status On-going   PT LONG TERM GOAL #6   Title To be met by 02/06/15: FOTO-Pt to increase activities balance confidence test score to 20.6% for improved balance confidence.   Time --  12/01/14   Status On-going   PT LONG TERM GOAL #7   Title To be met by 01/05/15: Pt to increase 6 minute walk test distance to 688' for improved endurance and activity tolerance.   Time --  12/01/14   Status On-going   PT LONG TERM GOAL #8   Title Ambulate 500' with cane on indoor, level surfaces for improved endurance and independence   Time --  12/01/14   Status Achieved  Achieved on 12/08/14               Plan - 01/08/15 1640    Clinical Impression Statement Pt was again limited by L LE pain and fatigue, as he required frequent seated rest breaks during session. Pt scored less on his BERG today (40/56) vs. last time BERG was assessed (45/56), this is likely due to L LE pain as it limited pt's activity. Continue with POC.   Pt will benefit from skilled therapeutic intervention in order to improve on the following deficits Abnormal gait;Decreased endurance;Impaired flexibility;Decreased balance;Decreased mobility;Decreased strength;Decreased knowledge of use of DME   Rehab Potential Good   PT Frequency 2x / week   PT Duration 8 weeks   PT  Treatment/Interventions ADLs/Self Care Home Management;Gait training;Neuromuscular re-education;Stair training;Functional mobility training;Patient/family education;Therapeutic activities;Therapeutic exercise;Manual techniques;Balance training;DME Instruction   PT Next Visit Plan G-code, Continue to assess LTGs.   Consulted and Agree with Plan of Care Patient        Problem List Patient Active Problem List   Diagnosis Date Noted  . Sinus bradycardia 09/21/2014  . Thoracic spondylosis with myelopathy 09/13/2014  . Acute diastolic heart failure 97/94/8016  . Cervical arthritis with myelopathy 07/18/2014  . Constipation - functional   . CAP (community acquired pneumonia) 07/10/2014  . Sepsis 07/10/2014  . HTN (hypertension) 07/10/2014  . OSA on CPAP 07/10/2014  . UTI (lower urinary tract infection) 07/10/2014  . Edema leg 07/10/2014  . Acute respiratory failure with hypoxia 07/10/2014  . 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 Other vitamin B12 deficiency anemia 07/13/2013  . 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 Polyneuropathy in other diseases classified elsewhere 02/17/2013  . Other general symptoms(780.99) 02/17/2013  . Abnormality of gait 02/17/2013  . Cervical spondylosis with myelopathy 02/17/2013    , L 01/08/2015, 4:43 PM  [facility] [address] [place], [place], [address] Phone: [phone]   Fax:  [phone]     Geoffry Paradise, PT,DPT 01/08/2015 4:43 PM Phone: [phone] Fax: [phone]

## 2015-01-12 ENCOUNTER — Ambulatory Visit: Payer: Medicare Other

## 2015-01-12 DIAGNOSIS — R269 Unspecified abnormalities of gait and mobility: Secondary | ICD-10-CM

## 2015-01-12 DIAGNOSIS — R262 Difficulty in walking, not elsewhere classified: Secondary | ICD-10-CM

## 2015-01-12 DIAGNOSIS — Z5189 Encounter for other specified aftercare: Secondary | ICD-10-CM | POA: Diagnosis not present

## 2015-01-12 DIAGNOSIS — R279 Unspecified lack of coordination: Secondary | ICD-10-CM

## 2015-01-12 NOTE — Patient Instructions (Signed)
Ankle: Plantar Flexion   Sit on a chair and cross the left ankle over the right knee. Gently use your hand to point the left foot downward until a gentle stretch is felt. Hold 30 seconds. Repeat 3 times. Do 2 sessions per day. CAUTION: Stretch should be gentle, steady and slow.    Elevated leg exercises: Lay on your back and prop both legs on a large ball, stool or chair so that your hips and knees are at 90 degree ankles. Then perform the following. -30x ankle pumps -30x alternate leg kicks keeping thigh resting on ball -2x10 straight leg raises lifting thighs off ball. *perform these on the "off days" of your other exercises. So every exercise you perform is every other day.  Hold off on heel walking for at least 5 days.   Ice the painful shin for no more than 20 minutes up to 4x per day over the weekend.  Copyright  VHI. All rights reserved.

## 2015-01-12 NOTE — Therapy (Signed)
[facility] [address] [place], [place], [address] Phone: [phone]   Fax:  [phone]  Physical Therapy Treatment  Patient Details  Name: Travis [name] MRN: [id number] Date of Birth: 05/24/1946 Referring Provider:  [name], MD  Encounter Date: 01/12/2015      PT End of Session - 01/12/15 1618    Visit Number 20   Number of Visits 29   Date for PT Re-Evaluation 02/06/15   Authorization Type Medicare-needs G codes every 10th visit   PT Start Time 1534   PT Stop Time 1615   PT Time Calculation (min) 41 min      Past Medical History  Diagnosis Date  . PONV (postoperative nausea and vomiting)   . Hypertension   . DJD (degenerative joint disease)     WHOLE SPINE  . Hyperthyroidism     Had Iodine to resolve issue DX 10 YR AGO  . Hypothyroidism     Low d/t treatment of hyperthyroidism  . Sleep apnea     DX  2009/2010 study done at [facility]  . Type II diabetes mellitus     diet controlled  . Heart murmur     Evaluated by cardiologist 4-5 years ago but does not currently see a cardiologist  . Gait disorder   . Cervical myelopathy     with myelomalacia at C5-6 and C3-4  . RLS (restless legs syndrome)   . Peripheral neuropathy   . 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 Constipation - functional     controlled with miralax  . RBBB   . 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 CHF (congestive heart failure)   . Pneumonia     Past Surgical History  Procedure Laterality Date  . Cholecystectomy    . Back surgery      6 surgeries  . Lumbar laminectomy/decompression microdiscectomy  07/14/2012    Procedure: LUMBAR LAMINECTOMY/DECOMPRESSION MICRODISCECTOMY 2 LEVELS;  Surgeon: Eustace Moore, MD;  Location: [facility];  Service: Neurosurgery;  Laterality: Left;  Lumbar two three laminectomy, left thoracic four five transpedicular diskectomy    There were no vitals taken for this visit.  Visit  Diagnosis:  Difficulty in walking  Abnormality of gait  Lack of coordination      Subjective Assessment - 01/12/15 1625    Symptoms Continues to have pain in L shin.   Currently in Pain? Yes   Pain Score 5      Pt noted to have increased scissoring and decreased heel strike when ambulating into gym today.   Manual therapy cross friction massage to anterior tibialis and passive anterior tibialis stretch and cross friction massage to posterior tibialis.  Therex: See HEP from today. Pt performed all exercises as indicated. And verbalized understanding of all education indicated in pt instructions.                       PT Education - 01/12/15 1618    Education provided Yes   Education Details HEP see handout   Person(s) Educated Patient   Methods Explanation;Handout   Comprehension Verbalized understanding;Returned demonstration          PT Short Term Goals - 11/27/14 1604    PT SHORT TERM GOAL #5   Title Ambulate 500' with cane on indoor, level surfaces for improved endurance and independence   Baseline Extended to long term goal   Time --  12/01/14  Status On-going           PT Long Term Goals - 01/08/15 1641    PT LONG TERM GOAL #1   Title Verbalize understanding of fall prevention strategies within home environment   Baseline to be met by 12/01/2014   Time --  12/01/14   Status On-going   PT LONG TERM GOAL #2   Title To be met by 01/05/15: increase BERG score to 46/56 for decreased fall risk   Baseline 40/56 on 01/08/15   Time --  12/01/14   Status Not Met  scored 45/56 on 12/11/14   PT LONG TERM GOAL #3   Title Decrease TUG time to 24 seconds with LRAD for improved effieciency of functional mobility   Time --  12/01/14   Status Achieved   PT LONG TERM GOAL #4   Title Sit to stand without UE support 3/3x without retropulsion for increased safety, independence and efficiency with functional mobiltiy   Time --  12/01/14   Status Achieved    PT LONG TERM GOAL #5   Title To be met by 01/05/15: Ambulate 1000' with cane outside on concrete with supervision for decreased fall risk and increased community access   Time --  12/01/14   Status On-going   PT LONG TERM GOAL #6   Title To be met by 02/06/15: FOTO-Pt to increase activities balance confidence test score to 20.6% for improved balance confidence.   Time --  12/01/14   Status On-going   PT LONG TERM GOAL #7   Title To be met by 01/05/15: Pt to increase 6 minute walk test distance to 688' for improved endurance and activity tolerance.   Time --  12/01/14   Status On-going   PT LONG TERM GOAL #8   Title Ambulate 500' with cane on indoor, level surfaces for improved endurance and independence   Time --  12/01/14   Status Achieved  Achieved on 12/08/14               Plan - 01/31/15 1620    Clinical Impression Statement Pt limited by LLE pain. Pain is most present with anterior tibialis contraction against resistance, and mild pain is felt with anterior tib stretch. Pt was educated on modifications to HEP to reduce risk of overuse of anterior tibilalis and decrease pain. Will likely D/C next week with plan for pt to take a break and return in a month for continued therapy.    PT Next Visit Plan Assess for spasticity and notify physician if appropriate/needed. Continue to assess LTGs. Plan to d/c next week and pt to return in a month, once pain is reduced and pt's BLE are feeling more rested.           G-Codes - Jan 31, 2015 1619    Functional Assessment Tool Used Merrilee Jansky 40/56   Functional Limitation Mobility: Walking and moving around   Mobility: Walking and Moving Around Current Status [phone]) At least 20 percent but less than 40 percent impaired, limited or restricted   Mobility: Walking and Moving Around Goal Status [phone]) At least 1 percent but less than 20 percent impaired, limited or restricted      Problem List Patient Active Problem List   Diagnosis Date Noted   . Sinus bradycardia 09/21/2014  . Thoracic spondylosis with myelopathy 09/13/2014  . Acute diastolic heart failure 09/81/1914  . Cervical arthritis with myelopathy 07/18/2014  . Constipation - functional   . CAP (community acquired pneumonia) 07/10/2014  .  Sepsis 07/10/2014  . HTN (hypertension) 07/10/2014  . OSA on CPAP 07/10/2014  . UTI (lower urinary tract infection) 07/10/2014  . Edema leg 07/10/2014  . Acute respiratory failure with hypoxia 07/10/2014  . 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 Other vitamin B12 deficiency anemia 07/13/2013  . 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 Polyneuropathy in other diseases classified elsewhere 02/17/2013  . Other general symptoms(780.99) 02/17/2013  . Abnormality of gait 02/17/2013  . Cervical spondylosis with myelopathy 02/17/2013    Delrae Sawyers D 01/12/2015, 4:28 PM  [facility] [address] [place], [place], [address] Phone: [phone]   Fax:  [phone]

## 2015-01-15 ENCOUNTER — Encounter: Payer: Self-pay | Admitting: Physical Therapy

## 2015-01-15 ENCOUNTER — Ambulatory Visit: Payer: Medicare Other | Admitting: Physical Therapy

## 2015-01-15 DIAGNOSIS — R262 Difficulty in walking, not elsewhere classified: Secondary | ICD-10-CM

## 2015-01-15 DIAGNOSIS — Z5189 Encounter for other specified aftercare: Secondary | ICD-10-CM | POA: Diagnosis not present

## 2015-01-15 DIAGNOSIS — R279 Unspecified lack of coordination: Secondary | ICD-10-CM

## 2015-01-15 DIAGNOSIS — R269 Unspecified abnormalities of gait and mobility: Secondary | ICD-10-CM

## 2015-01-15 NOTE — Patient Instructions (Signed)
Seated Hip external rotation stretch: While sitting, cross your left leg over your right and gently push your knee out and down until you feel a gentle stretch in your outer left thigh. Hold for 30 seconds, then relax by pulling your knee up and in.  Repeat 3 times for 30 seconds, with a gentle stretch.  This stretch should not create pain

## 2015-01-15 NOTE — Therapy (Signed)
[facility] [address] [place], [place], [address] Phone: [phone]   Fax:  [phone]  Physical Therapy Treatment  Patient Details  Name: Travis [name] MRN: [id number] Date of Birth: Sep 08, 1946 Referring Provider:  [name], MD  Encounter Date: 01/15/2015      PT End of Session - 01/18/15 1446    Visit Number 21   Number of Visits 29   Date for PT Re-Evaluation 02/06/15   Authorization Type Medicare-needs G codes every 10th visit   PT Start Time 1534   PT Stop Time 1615   PT Time Calculation (min) 41 min   Activity Tolerance Patient tolerated treatment well      Past Medical History  Diagnosis Date  . PONV (postoperative nausea and vomiting)   . Hypertension   . DJD (degenerative joint disease)     WHOLE SPINE  . Hyperthyroidism     Had Iodine to resolve issue DX 10 YR AGO  . Hypothyroidism     Low d/t treatment of hyperthyroidism  . Sleep apnea     DX  2009/2010 study done at [facility]  . Type II diabetes mellitus     diet controlled  . Heart murmur     Evaluated by cardiologist 4-5 years ago but does not currently see a cardiologist  . Gait disorder   . Cervical myelopathy     with myelomalacia at C5-6 and C3-4  . RLS (restless legs syndrome)   . Peripheral neuropathy   . 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 Constipation - functional     controlled with miralax  . RBBB   . 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 CHF (congestive heart failure)   . Pneumonia     Past Surgical History  Procedure Laterality Date  . Cholecystectomy    . Back surgery      6 surgeries  . Lumbar laminectomy/decompression microdiscectomy  07/14/2012    Procedure: LUMBAR LAMINECTOMY/DECOMPRESSION MICRODISCECTOMY 2 LEVELS;  Surgeon: Eustace Moore, MD;  Location: [facility];  Service: Neurosurgery;  Laterality: Left;  Lumbar two three laminectomy, left thoracic four five transpedicular diskectomy     There were no vitals taken for this visit.  Visit Diagnosis:  Difficulty in walking  Abnormality of gait  Lack of coordination       Pt has multiple questions during visit regarding appropriate times for stretching, appropriate times for holding off of exercises, use of ice/heat, use of massage based on previous therapy sessions.  PT tried to answer questions appropriately, but ultimately referred pt to ask his primary therapist for any further instruction regarding previously given exercises, stretches and pain.  Pt performs stretches given last visit-seated and standing ant. tibilalis stretch.  PT instructs pt in/pt performs seated hip external rotation stretch, 3 x 30 seconds each leg, to address IT band.  PT performs cross friction massage to anterior tibialis; performs cross friction massage to IT band along L leg.  Discussed posture/positioning and that connection to pain.                   PT Education - 01/18/15 1445    Education provided Yes   Education Details HEP for external rotation stretch   Person(s) Educated Patient   Methods Explanation;Handout   Comprehension Verbalized understanding;Returned demonstration          PT Short Term Goals - 11/27/14 1604    PT SHORT TERM GOAL #5  Title Ambulate 500' with cane on indoor, level surfaces for improved endurance and independence   Baseline Extended to long term goal   Time --  12/01/14   Status On-going           PT Long Term Goals - 01/08/15 1641    PT LONG TERM GOAL #1   Title Verbalize understanding of fall prevention strategies within home environment   Baseline to be met by 12/01/2014   Time --  12/01/14   Status On-going   PT LONG TERM GOAL #2   Title To be met by 01/05/15: increase BERG score to 46/56 for decreased fall risk   Baseline 40/56 on 01/08/15   Time --  12/01/14   Status Not Met  scored 45/56 on 12/11/14   PT LONG TERM GOAL #3   Title Decrease TUG time to 24 seconds  with LRAD for improved effieciency of functional mobility   Time --  12/01/14   Status Achieved   PT LONG TERM GOAL #4   Title Sit to stand without UE support 3/3x without retropulsion for increased safety, independence and efficiency with functional mobiltiy   Time --  12/01/14   Status Achieved   PT LONG TERM GOAL #5   Title To be met by 01/05/15: Ambulate 1000' with cane outside on concrete with supervision for decreased fall risk and increased community access   Time --  12/01/14   Status On-going   PT LONG TERM GOAL #6   Title To be met by 02/06/15: FOTO-Pt to increase activities balance confidence test score to 20.6% for improved balance confidence.   Time --  12/01/14   Status On-going   PT LONG TERM GOAL #7   Title To be met by 01/05/15: Pt to increase 6 minute walk test distance to 688' for improved endurance and activity tolerance.   Time --  12/01/14   Status On-going   PT LONG TERM GOAL #8   Title Ambulate 500' with cane on indoor, level surfaces for improved endurance and independence   Time --  12/01/14   Status Achieved  Achieved on 12/08/14               Plan - 01/18/15 1446    Clinical Impression Statement Pt continues to be limited by pain.  Pt feels that he does want to D/C next visit to allow pain to improve.   Pt will benefit from skilled therapeutic intervention in order to improve on the following deficits Abnormal gait;Decreased endurance;Impaired flexibility;Decreased balance;Decreased mobility;Decreased strength;Decreased knowledge of use of DME   Rehab Potential Good   PT Frequency 2x / week   PT Duration 8 weeks   PT Treatment/Interventions ADLs/Self Care Home Management;Gait training;Neuromuscular re-education;Stair training;Functional mobility training;Patient/family education;Therapeutic activities;Therapeutic exercise;Manual techniques;Balance training;DME Instruction   PT Next Visit Plan possible d/c next visit   Consulted and Agree with Plan of  Care Patient        Problem List Patient Active Problem List   Diagnosis Date Noted  . Sinus bradycardia 09/21/2014  . Thoracic spondylosis with myelopathy 09/13/2014  . Acute diastolic heart failure 70/96/2836  . Cervical arthritis with myelopathy 07/18/2014  . Constipation - functional   . CAP (community acquired pneumonia) 07/10/2014  . Sepsis 07/10/2014  . HTN (hypertension) 07/10/2014  . OSA on CPAP 07/10/2014  . UTI (lower urinary tract infection) 07/10/2014  . Edema leg 07/10/2014  . Acute respiratory failure with hypoxia 07/10/2014  . 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  Other vitamin B12 deficiency anemia 07/13/2013  . 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 Polyneuropathy in other diseases classified elsewhere 02/17/2013  . Other general symptoms(780.99) 02/17/2013  . Abnormality of gait 02/17/2013  . Cervical spondylosis with myelopathy 02/17/2013    , W. 01/18/2015, 2:49 PM for treatment performed on 01/15/15   , PT 01/18/2015 2:49 PM Phone: [phone] Fax: [phone]   [facility] [address] [place], [place], [address] Phone: [phone]   Fax:  [phone]     

## 2015-01-19 ENCOUNTER — Ambulatory Visit: Payer: Medicare Other

## 2015-01-26 ENCOUNTER — Telehealth: Payer: Self-pay

## 2015-01-26 NOTE — Telephone Encounter (Signed)
Left message asking pt to call back to let us know if he would like to reschedule his final appointment with [place] (it was cancelled due to inclimate weather) or if he would like [place] to go ahead an close out his plan of care.

## 2015-02-06 NOTE — Therapy (Signed)
[facility] [address] [place], [place], [address] Phone: [phone]   Fax:  [phone]  Patient Details  Name: Travis [name] MRN: [id number] Date of Birth: 06/07/1946 Referring Provider:  No ref. provider found  Encounter Date: 02/06/2015 PHYSICAL THERAPY DISCHARGE SUMMARY  Visits from Start of Care: 21  Current functional level related to goals / functional outcomes:Not all goals were checked due to pt's final appointment being cancelled due to inclimate weather, and pt requested not to reschedule. PT Long Term Goals - 01/08/15 1641    PT LONG TERM GOAL #1   Title Verbalize understanding of fall prevention strategies within home environment   Baseline to be met by 12/01/2014   Time --  12/01/14   Status On-going   PT LONG TERM GOAL #2   Title To be met by 01/05/15: increase BERG score to 46/56 for decreased fall risk   Baseline 40/56 on 01/08/15   Time --  12/01/14   Status Not Met  scored 45/56 on 12/11/14   PT LONG TERM GOAL #3   Title Decrease TUG time to 24 seconds with LRAD for improved effieciency of functional mobility   Time --  12/01/14   Status Achieved   PT LONG TERM GOAL #4   Title Sit to stand without UE support 3/3x without retropulsion for increased safety, independence and efficiency with functional mobiltiy   Time --  12/01/14   Status Achieved   PT LONG TERM GOAL #5   Title To be met by 01/05/15: Ambulate 1000' with cane outside on concrete with supervision for decreased fall risk and increased community access   Time --  12/01/14   Status On-going   PT LONG TERM GOAL #6   Title To be met by 02/06/15: FOTO-Pt to increase activities balance confidence test score to 20.6% for improved balance confidence.   Time --  12/01/14   Status On-going   PT LONG TERM GOAL #7   Title To be met by 01/05/15: Pt to increase 6 minute walk  test distance to 688' for improved endurance and activity tolerance.   Time --  12/01/14   Status On-going   PT LONG TERM GOAL #8   Title Ambulate 500' with cane on indoor, level surfaces for improved endurance and independence   Time --  12/01/14   Status Achieved  Achieved on 12/08/14        Remaining deficits: Pt continues to have BLE and core weakness, hip adductor spasticity and muscle tightness, gait and balance impairments as well as ankle pain and pain in the IT band region. Due to pt's recent increase in pain, and decline in progress. Pt is going to take a break from physical therapy and plans to return (will need new physician orders) when he is able to tolerate more activity.   Education / Equipment: Home exercise program Plan: Patient agrees to discharge.  Patient goals were partially met. Patient is being discharged due to lack of progress.decreased activity tolerance due to pain.  ?????        , PT,DPT,NCS 02/06/2015 11:32 AM Phone [phone] FAX [phone]         [facility] [address] [place], [place], [address] Phone: [phone]   Fax:  [phone] 

## 2015-02-10 ENCOUNTER — Other Ambulatory Visit: Payer: Self-pay | Admitting: Physical Medicine & Rehabilitation

## 2015-03-09 ENCOUNTER — Encounter: Payer: Self-pay | Admitting: Cardiovascular Disease

## 2015-03-09 ENCOUNTER — Ambulatory Visit (INDEPENDENT_AMBULATORY_CARE_PROVIDER_SITE_OTHER): Payer: Medicare Other | Admitting: Cardiovascular Disease

## 2015-03-09 VITALS — BP 158/78 | HR 67 | Ht 73.0 in | Wt 282.8 lb

## 2015-03-09 DIAGNOSIS — I998 Other disorder of circulatory system: Secondary | ICD-10-CM

## 2015-03-09 DIAGNOSIS — R0602 Shortness of breath: Secondary | ICD-10-CM | POA: Insufficient documentation

## 2015-03-09 DIAGNOSIS — R0600 Dyspnea, unspecified: Secondary | ICD-10-CM

## 2015-03-09 DIAGNOSIS — R0609 Other forms of dyspnea: Secondary | ICD-10-CM

## 2015-03-09 DIAGNOSIS — I10 Essential (primary) hypertension: Secondary | ICD-10-CM

## 2015-03-09 NOTE — Patient Instructions (Signed)
Your physician has requested that you have a lexiscan myoview. For further information please visit [email or web address]. Please follow instruction sheet, as given.  Your physician recommends that you continue on your current medications as directed. Please refer to the Current Medication list given to you today.  Your physician wants you to follow-up in: 4 months with Dr. Acie Fredrickson. You will receive a reminder letter in the mail two months in advance. If you don't receive a letter, please call our office to schedule the follow-up appointment.

## 2015-03-09 NOTE — Progress Notes (Signed)
Cardiology Office Note   Date:  03/09/2015   ID:  Travis [name], Travis [name] Apr 14, 1946, MRN [id number]  PCP:  Simona Huh, MD  Cardiologist:   [name], MD   Chief Complaint  Patient presents with  . Follow-up    HTN   1. Hypertension 2. Hypothyroidism 3. DM 4. OSA   History of Present Illness: Travis [name] is seen back today for a post hospital visit. He is the father - in -law of Travis [name] (CCU nurse) and Travis (Beckham) I saw him in consultatoin on July 17, 2014 for issues related to his diastolic dysfunction in the setting of pneumonia / respiratory failure He was seen By Truitt Merle in late August for follow up of his hospitalization.   [name] He is a 69 year old male with HTN, hypothyroidism, OSA, poorly controlled type 2 DM, former smoker and DJD.  Admitted back in July with CAP/UTI with associated sepsis. Was intubated due to progressive respiratory failure. Had AKI/ATN in the setting of PNA, hypoxic RF and septic shock. Felt to have some degree of diastolic HF - echo was not of good quality. Apparently has had past high sodium diet and uncontrolled HTN. Looks like he was on Lisinopril HCT, cardura and a lower dose of beta blocker. Mildly elevated troponin but no anginal symptoms and this was suspected to be from respiratory failure/CAP with septic shock.   Was seen by Cecille Rubin on August 28, Reported getting stronger, Breathing was ok. Leg edema has improved.   Sept. 9, 2015:  Travis [name] is seen today with his wife. Walking with a walker. Getting stronger every day.  Has cut out his high salt foods.  He i still working Administrator, sports estate)  Has been seen by home health nurses - HR has been slow. No episodes of syncope.   Sept. 24, 2015:  Travis [name] is doing OK but continues to gradually gained strength. His home health nurse recently noted that his heart rate was very slow. Occasionally have some episodes of very mild lightheadedness when he sat  on the toilet type of bowel movement. He has not had episodes of syncope and seems to be doing pretty well at other times. He Has not had any syncope.   Dec. 11, 2015:  Travis [name] is here today for his HTN,  Has OSA - has been wearing his CPAP. , no CP and no dyspnea. Occasional eipisodes of orthostatic hypotension. Making progress with his rehab but he had to suspend it because of his elevated BP Does his best to avoid salt.  We reviewed his diet - no high salt items typically .  BP at the rehab is typically elevated but    March 09, 2015:   Travis [name] is a 69 y.o. male who presents for follow-up of his high blood pressure. He slowly getting stronger but still walks with the assistance of a walker. He has some left leg pain and he has had to re-start using his walker again - was using a cane.  Wears his CPAP at night.  BP yesterday was [id number] systolic .   He had some lightheadedness yesterday - glucose was ok.  BP was ok    Past Medical History  Diagnosis Date  . PONV (postoperative nausea and vomiting)   . Hypertension   . DJD (degenerative joint disease)     WHOLE SPINE  . Hyperthyroidism     Had Iodine to resolve issue DX 10  YR AGO  . Hypothyroidism     Low d/t treatment of hyperthyroidism  . Sleep apnea     DX  2009/2010 study done at [facility]  . Type II diabetes mellitus     diet controlled  . Heart murmur     Evaluated by cardiologist 4-5 years ago but does not currently see a cardiologist  . Gait disorder   . Cervical myelopathy     with myelomalacia at C5-6 and C3-4  . RLS (restless legs syndrome)   . Peripheral neuropathy   . 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 Constipation - functional     controlled with miralax  . RBBB   . 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 CHF (congestive heart failure)   . Pneumonia     Past Surgical History  Procedure Laterality Date  . Cholecystectomy    . Back surgery      6 surgeries  . Lumbar  laminectomy/decompression microdiscectomy  07/14/2012    Procedure: LUMBAR LAMINECTOMY/DECOMPRESSION MICRODISCECTOMY 2 LEVELS;  Surgeon: Eustace Moore, MD;  Location: [facility];  Service: Neurosurgery;  Laterality: Left;  Lumbar two three laminectomy, left thoracic four five transpedicular diskectomy     Current Outpatient Prescriptions  Medication Sig Dispense Refill  . baclofen (LIORESAL) 10 MG tablet Take 1 tablet (10 mg total) by mouth 3 (three) times daily. For muscle spasms 90 each 1  . ciclopirox (PENLAC) 8 % solution Apply topically at bedtime.     . diclofenac (VOLTAREN) 50 MG EC tablet Take 75 mg by mouth daily.    [name] escitalopram (LEXAPRO) 5 MG tablet Take 1 tablet (5 mg total) by mouth at bedtime. (Patient taking differently: Take 10 mg by mouth at bedtime. ) 30 tablet 1  . fesoterodine (TOVIAZ) 4 MG TB24 tablet Take 4 mg by mouth 2 (two) times daily.    . furosemide (LASIX) 40 MG tablet TAKE A WHOLE PILL WITH BREAKFAST AND TAKE 1/2 A PILL AFTER LUNCH 45 tablet 5  . gabapentin (NEURONTIN) 400 MG capsule TAKE 1 CAPSULE (400 MG TOTAL) BY MOUTH EVERY 8 (EIGHT) HOURS. 90 capsule 1  . glimepiride (AMARYL) 2 MG tablet Take 2 mg by mouth daily with breakfast.    . levothyroxine (SYNTHROID, LEVOTHROID) 150 MCG tablet Take 175 mcg by mouth daily.     [name] losartan (COZAAR) 100 MG tablet Take 1 tablet (100 mg total) by mouth daily. 90 tablet 3  . polyethylene glycol (MIRALAX / GLYCOLAX) packet Take 17 g by mouth daily as needed. For constipation 14 each 0  . potassium chloride (K-DUR) 10 MEQ tablet Take 1 tablet (10 mEq total) by mouth daily. 30 tablet 6  . pramipexole (MIRAPEX) 0.5 MG tablet Take 0.125 mg by mouth 2 (two) times daily.    . tamsulosin (FLOMAX) 0.4 MG CAPS capsule Take 0.4 mg by mouth daily.      No current facility-administered medications for this visit.    Allergies:   Other    Social History:  The patient  reports that he quit smoking about 29 years ago. His smoking  use included Cigarettes. He has a 40 pack-year smoking history. He quit smokeless tobacco use about 29 years ago. He reports that he does not drink alcohol or use illicit drugs.   Family History:  The patient's family history includes CAD in an other family member; Diabetes in his father; Heart disease in his father; Hypertension in his father; Polycythemia in his mother.  ROS:  Please see the history of present illness.    Review of Systems: Constitutional:  denies fever, chills, diaphoresis, appetite change and fatigue.  HEENT: denies photophobia, eye pain, redness, hearing loss, ear pain, congestion, sore throat, rhinorrhea, sneezing, neck pain, neck stiffness and tinnitus.  Respiratory: admits to SOB, DOE, cough, chest tightness, and wheezing.  Cardiovascular: admits to   leg swelling.  Gastrointestinal: denies nausea, vomiting, abdominal pain, diarrhea, constipation, blood in stool.  Genitourinary: denies dysuria, urgency, frequency, hematuria, flank pain and difficulty urinating.  Musculoskeletal: denies  myalgias, back pain, joint swelling, arthralgias and gait problem.   Skin: denies pallor, rash and wound.  Neurological: denies dizziness, seizures, syncope, weakness, light-headedness, numbness and headaches.   Hematological: denies adenopathy, easy bruising, personal or family bleeding history.  Psychiatric/ Behavioral: denies suicidal ideation, mood changes, confusion, nervousness, sleep disturbance and agitation.       All other systems are reviewed and negative.    PHYSICAL EXAM: VS:  BP 158/78 mmHg  Pulse 67  Ht 6\' 1"  (1.854 m)  Wt 282 lb 12.8 oz (128.277 kg)  BMI 37.32 kg/m2 , BMI Body mass index is 37.32 kg/(m^2). GEN: Well nourished, well developed, in no acute distress HEENT: normal Neck: no JVD, carotid bruits, or masses Cardiac: RRR; no murmurs, rubs, or gallops, trace bilateral edema  Respiratory:  clear to auscultation bilaterally, normal work of  breathing GI: soft, nontender, nondistended, + BS MS: no deformity or atrophy Skin: warm and dry, no rash Neuro:  Strength and sensation are intact.  He was walking with the assistance of a walker. Psych: normal   EKG:  EKG is ordered today. The ekg ordered today demonstrates normal sinus rhythm at 67. He has occasional premature ventricular contractions. He has right bundle branch block.   Recent Labs: 07/10/2014: Pro B Natriuretic peptide (BNP) 1750.0* 07/18/2014: Magnesium 2.7* 07/19/2014: ALT 73*; Hemoglobin 12.7*; Platelets 316 09/21/2014: TSH 2.17 01/05/2015: BUN 32*; Creatinine 1.07; Potassium 3.9; Sodium 140    Lipid Panel    Component Value Date/Time   TRIG 145 07/18/2014 0500      Wt Readings from Last 3 Encounters:  03/09/15 282 lb 12.8 oz (128.277 kg)  01/05/15 274 lb (124.286 kg)  12/08/14 268 lb 6.4 oz (121.745 kg)      Other studies Reviewed: Additional studies/ records that were reviewed today include: . Review of the above records demonstrates:    ASSESSMENT AND PLAN:  1. Hypertension- blood pressures fairly well-controlled. He still has some occasional episodes of dizziness that may be due to orthostatic hypertension. Continue same medications for now. 2. Hypothyroidism 3. DM- glucose levels have been well-controlled. 4. OSA 5. Shortness breath with exertion: He denies having any chest pain but his shortness of breath may be an angina equivalent. He has normal left ventricular systolic function. We will continue the same medications. We'll schedule him for a Lexiscan Myoview study to evaluate him for the possibility of ischemia.    Current medicines are reviewed at length with the patient today.  The patient does not have concerns regarding medicines.  The following changes have been made:  no change  Labs/ tests ordered today include:   Orders Placed This Encounter  Procedures  . Myocardial Perfusion Imaging  . EKG 12-Lead     Disposition:    FU with me in 4 months     Signed, Nahser, Wonda Cheng, MD  03/09/2015 9:20 AM    [facility] [place],  [place], [place]  [address] Phone: [phone]; Fax: [phone]

## 2015-03-13 ENCOUNTER — Other Ambulatory Visit: Payer: Self-pay | Admitting: Physical Medicine & Rehabilitation

## 2015-03-14 ENCOUNTER — Other Ambulatory Visit: Payer: Self-pay

## 2015-03-14 ENCOUNTER — Encounter: Payer: Self-pay | Admitting: Neurology

## 2015-03-14 ENCOUNTER — Ambulatory Visit (INDEPENDENT_AMBULATORY_CARE_PROVIDER_SITE_OTHER): Payer: Medicare Other | Admitting: Neurology

## 2015-03-14 VITALS — BP 156/73 | HR 54 | Ht 73.0 in | Wt 280.0 lb

## 2015-03-14 DIAGNOSIS — G2581 Restless legs syndrome: Secondary | ICD-10-CM | POA: Diagnosis not present

## 2015-03-14 DIAGNOSIS — I10 Essential (primary) hypertension: Secondary | ICD-10-CM

## 2015-03-14 DIAGNOSIS — M4807 Spinal stenosis, lumbosacral region: Secondary | ICD-10-CM | POA: Diagnosis not present

## 2015-03-14 DIAGNOSIS — M4714 Other spondylosis with myelopathy, thoracic region: Secondary | ICD-10-CM | POA: Diagnosis not present

## 2015-03-14 DIAGNOSIS — R269 Unspecified abnormalities of gait and mobility: Secondary | ICD-10-CM

## 2015-03-14 HISTORY — DX: Restless legs syndrome: G25.81

## 2015-03-14 MED ORDER — LOSARTAN POTASSIUM 100 MG PO TABS: 100.0000 mg | ORAL_TABLET | Freq: Every day | ORAL | Status: DC

## 2015-03-14 NOTE — Patient Instructions (Signed)

## 2015-03-14 NOTE — Progress Notes (Signed)
Reason for visit: Gait disorder  Travis [name] is an 69 y.o. male  History of present illness:  Travis [name] is a 69 year old right-handed white male with a history of multilevel spine disease with lumbosacral spinal stenosis, and a cervical and thoracic myelopathy. The patient has a chronic gait disorder associated with this. He has recovered from the pneumonia issue that he had in the summer 2015. He has gotten physical therapy, but he had to stop the therapy 2 months ago secondary to developing left leg pain from the hip down to the ankle. Within the last couple weeks, the pain has resolved. He is now trying to get back into some physical activity. He has restless leg syndrome symptoms of the lower extremities that may increase if he is particularly active. The patient uses a cane or a walker for ambulation. He denies any falls. He is on gabapentin on a daily basis, and he takes Mirapex for the restless leg syndrome.  Past Medical History  Diagnosis Date  . PONV (postoperative nausea and vomiting)   . Hypertension   . DJD (degenerative joint disease)     WHOLE SPINE  . Hyperthyroidism     Had Iodine to resolve issue DX 10 YR AGO  . Hypothyroidism     Low d/t treatment of hyperthyroidism  . Sleep apnea     DX  2009/2010 [facility] done [facility]  . Type II diabetes mellitus     diet controlled  . Heart murmur     Evaluated by cardiologist 4-5 years ago but does not currently see a cardiologist  . Gait disorder   . Cervical myelopathy     with myelomalacia at C5-6 and C3-4  . RLS (restless legs syndrome)   . Peripheral neuropathy   . 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 Constipation - functional     controlled with miralax  . RBBB   . 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 CHF (congestive heart failure)   . Pneumonia   . Restless legs syndrome 03/14/2015    Past Surgical History  Procedure Laterality Date  . Cholecystectomy    . Back surgery      6 surgeries  . Lumbar  laminectomy/decompression microdiscectomy  07/14/2012    Procedure: LUMBAR LAMINECTOMY/DECOMPRESSION MICRODISCECTOMY 2 LEVELS;  Surgeon: Eustace Moore, MD;  Location: [place] [facility];  Service: Neurosurgery;  Laterality: Left;  Lumbar two three laminectomy, left thoracic four five transpedicular diskectomy    Family History  Problem Relation Age of Onset  . Polycythemia Mother   . Diabetes Father   . Hypertension Father   . Heart disease Father   . CAD      1 of his 4 brothers and father has CAD    Social history:  reports that he quit smoking about 29 years ago. His smoking use included Cigarettes. He has a 40 pack-year smoking history. He quit smokeless tobacco use about 29 years ago. He reports that he does not drink alcohol or use illicit drugs.    Allergies  Allergen Reactions  . Other Other (See Comments)    Anesthesia makes sick, must have antinausea medication in advance.    Medications:  Prior to Admission medications   Medication Sig Start Date End Date Taking? Authorizing Provider  baclofen (LIORESAL) 10 MG tablet Take 1 tablet (10 mg total) by mouth 3 (three) times daily. For muscle spasms 10/30/14  Yes Meredith Staggers, MD  ciclopirox [facility])  8 % solution Apply topically at bedtime.  09/05/14  Yes Historical Provider, MD  diclofenac (VOLTAREN) 50 MG EC tablet Take 75 mg by mouth daily.   Yes Historical Provider, MD  escitalopram (LEXAPRO) 5 MG tablet Take 1 tablet (5 mg total) by mouth at bedtime. Patient taking differently: Take 10 mg by mouth at bedtime.  08/09/14  Yes Ivan Anchors Love, [name]  fesoterodine (TOVIAZ) 4 MG TB24 tablet Take 4 mg by mouth 2 (two) times daily.   Yes Historical Provider, MD  furosemide (LASIX) 40 MG tablet TAKE A WHOLE PILL WITH BREAKFAST AND TAKE 1/2 A PILL AFTER LUNCH 10/25/14  Yes [name], MD  gabapentin (NEURONTIN) 400 MG capsule TAKE 1 CAPSULE (400 MG TOTAL) BY MOUTH EVERY 8 (EIGHT) HOURS. 02/12/15  Yes Meredith Staggers, MD  glimepiride  (AMARYL) 2 MG tablet Take 2 mg by mouth daily with breakfast.   Yes Historical Provider, MD  levothyroxine (SYNTHROID, LEVOTHROID) 150 MCG tablet Take 175 mcg by mouth daily.    Yes Historical Provider, MD  losartan (COZAAR) 100 MG tablet Take 1 tablet (100 mg total) by mouth daily. 12/08/14  Yes [name], MD  polyethylene glycol [facility] Flor[facility] Raveling) packet Take 17 g by mouth daily as needed. For constipation 08/09/14  Yes Ivan Anchors Love, [name]  potassium chloride (K-DUR) 10 MEQ tablet Take 1 tablet (10 mEq total) by mouth daily. 09/07/14  Yes [name], MD  pramipexole (MIRAPEX) 0.5 MG tablet Take 0.5 mg by mouth 2 (two) times daily.  08/09/14  Yes Ivan Anchors Love, [name]  tamsulosin (FLOMAX) 0.4 MG CAPS capsule Take 0.4 mg by mouth daily.  09/13/14  Yes Historical Provider, MD    ROS:  Out of a complete 14 system review of symptoms, the patient complains only of the following symptoms, and all other reviewed systems are negative.  Leg swelling Gait disorder  Blood pressure 156/73, pulse 54, height 6\' 1"  (1.854 m), weight 280 lb (127.007 kg).  Physical Exam  General: The patient is alert and cooperative at the time of the examination. The patient is moderately obese.   Skin: 1-2+ edema below the knees is noted bilaterally.   Neurologic Exam  Mental status: The patient is alert and oriented x 3 at the time of the examination. The patient has apparent normal recent and remote memory, with an apparently normal attention span and concentration ability.   Cranial nerves: Facial symmetry is present. Speech is normal, no aphasia or dysarthria is noted. Extraocular movements are full. Visual fields are full.  Motor: The patient has good strength in all 4 extremities.  Sensory examination: Soft touch sensation is symmetric on the face, arms, and legs.  Coordination: The patient has good finger-nose-finger and heel-to-shin bilaterally.  Gait and station: The patient has difficulty  arising from a seated position. Once up, the patient has a wide-based gait, slow and deliberate. Tandem gait was not tested. Romberg is positive, the patient falls forward.  Reflexes: Deep tendon reflexes are symmetric.   Assessment/Plan:  1. Cervical and thoracic myelopathy  2. Chronic gait disorder  3. Restless leg syndrome  At the current time, his neurologic issues are stable. The patient is walking with a walker, he remains safe, and he currently is not having a lot of pain. The patient will continue his current medications. He will follow-up in about 9 months. The patient will get back into physical therapy within the next several weeks if he continues to do  wellJill Alexanders MD 03/14/2015 9:00 PM  [facility] [address] [place], [place] [address]  Phone [phone] Fax [phone]

## 2015-03-19 ENCOUNTER — Ambulatory Visit (HOSPITAL_COMMUNITY): Payer: Medicare Other | Attending: Cardiovascular Disease | Admitting: Radiology

## 2015-03-19 DIAGNOSIS — I998 Other disorder of circulatory system: Secondary | ICD-10-CM

## 2015-03-19 DIAGNOSIS — E119 Type 2 diabetes mellitus without complications: Secondary | ICD-10-CM | POA: Insufficient documentation

## 2015-03-19 DIAGNOSIS — R42 Dizziness and giddiness: Secondary | ICD-10-CM | POA: Diagnosis not present

## 2015-03-19 DIAGNOSIS — I10 Essential (primary) hypertension: Secondary | ICD-10-CM | POA: Diagnosis not present

## 2015-03-19 MED ORDER — REGADENOSON 0.4 MG/5ML IV SOLN
0.4000 mg | Freq: Once | INTRAVENOUS | Status: AC
  Administered 2015-03-19: 0.4 mg via INTRAVENOUS

## 2015-03-19 MED ORDER — TECHNETIUM TC 99M SESTAMIBI GENERIC - CARDIOLITE
33.0000 | Freq: Once | INTRAVENOUS | Status: AC | PRN
  Administered 2015-03-19: 33 via INTRAVENOUS

## 2015-03-19 NOTE — Progress Notes (Signed)
[facility] [address] [place], [place] [address] [phone]    Cardiology Nuclear Med Study  Travis [name] is a 69 y.o. male     MRN : [id number]     DOB: 1946-04-16  Procedure Date: 03/19/2015  Nuclear Med Background Indication for Stress Test:  Evaluation for Ischemia History:  No known CAD Cardiac Risk Factors: Hypertension and NIDDM  Symptoms:  Light-Headedness   Nuclear Pre-Procedure Caffeine/Decaff Intake:  None NPO After: 7:00pm   Lungs:  clear O2 Sat: 93% on room air. IV 0.9% NS with Angio Cath:  20g  IV Site: R Antecubital  IV Started by:  Perrin Maltese, EMT-P  Chest Size (in):  50 Cup Size: n/a  Height: 6\' 1"  (1.854 m)  Weight:  275 lb (124.739 kg)  BMI:  Body mass index is 36.29 kg/(m^2). Tech Comments:  [name]    Nuclear Med Study 1 or 2 day study: 1 day  Stress Test Type:  Lexiscan  Reading MD: [name]  Order Authorizing Provider:  Mertie Moores, MD  Resting Radionuclide: Technetium 9m Sestamibi  Resting Radionuclide Dose: 33.0 mCi on 03/21/2015  Stress Radionuclide:  Technetium 21m Sestamibi  Stress Radionuclide Dose: 33.0 mCi on 03/19/2015          Stress Protocol Rest HR: 57 Stress HR: 80  Rest BP: 202/81 Stress BP: 149/63  Exercise Time (min): n/a METS: n/a   Predicted Max HR: 150 bpm % Max HR: 53.33 bpm Rate Pressure Product: 12560   Dose of Adenosine (mg):  n/a Dose of Lexiscan: 0.4 mg  Dose of Atropine (mg): n/a Dose of Dobutamine: n/a mcg/kg/min (at max HR)  Stress Test Technologist: Glade Lloyd, BS-ES  Nuclear Technologist:  Earl Many, CNMT     Rest Procedure:  Myocardial perfusion imaging was performed at rest 45 minutes following the intravenous administration of Technetium 43m Sestamibi. Rest ECG: Sinus rhythm/sinus bradycardia rate 57, right bundle branch block  Stress Procedure:  The patient received IV Lexiscan 0.4 mg over 15-seconds.  Technetium 45m Sestamibi injected at 30-seconds.   Quantitative spect images were obtained after a 45 minute delay.  During the infusion of Lexiscan the patient complained of SOB and flushing.  These symptoms resolved in recovery.  Stress ECG: No significant change from baseline ECG, PVCs noted  QPS Raw Data Images:  Normal; no motion artifact; normal heart/lung ratio. Stress Images:  Normal homogeneous uptake in all areas of the myocardium. Rest Images:  Normal homogeneous uptake in all areas of the myocardium. Subtraction (SDS):  No evidence of ischemia. Transient Ischemic Dilatation (Normal <1.22):  1.06 Lung/Heart Ratio (Normal <0.45):  0.26  Quantitative Gated Spect Images QGS EDV:  174 ml QGS ESV:  91 ml  Impression Exercise Capacity:  Lexiscan with no exercise. BP Response:  Normal blood pressure response. Clinical Symptoms:  No significant symptoms noted. ECG Impression:  No significant ST segment change suggestive of ischemia. Comparison with Prior Nuclear Study: No images to compare  Overall Impression:   Overall low risk study with no evidence of ischemia identified  LV Ejection Fraction: 48%, mildly reduced. Left ventricular chamber mildly dilated.  LV Wall Motion:  Asynchronous contraction noted. Prior echocardiogram on 07/12/14 demonstrated normal ejection fraction of 60-65%.  Candee Furbish, MD

## 2015-03-21 ENCOUNTER — Encounter (HOSPITAL_COMMUNITY): Payer: Medicare Other | Attending: Family Medicine

## 2015-03-21 DIAGNOSIS — R0989 Other specified symptoms and signs involving the circulatory and respiratory systems: Secondary | ICD-10-CM

## 2015-03-21 MED ORDER — TECHNETIUM TC 99M SESTAMIBI GENERIC - CARDIOLITE
33.0000 | Freq: Once | INTRAVENOUS | Status: AC | PRN
  Administered 2015-03-21: 33 via INTRAVENOUS

## 2015-03-26 ENCOUNTER — Other Ambulatory Visit: Payer: Self-pay | Admitting: Neurology

## 2015-03-26 DIAGNOSIS — R269 Unspecified abnormalities of gait and mobility: Secondary | ICD-10-CM

## 2015-03-27 ENCOUNTER — Other Ambulatory Visit: Payer: Self-pay | Admitting: Cardiovascular Disease

## 2015-04-04 ENCOUNTER — Ambulatory Visit: Payer: Medicare Other | Attending: Neurology

## 2015-04-04 DIAGNOSIS — Z5189 Encounter for other specified aftercare: Secondary | ICD-10-CM | POA: Diagnosis present

## 2015-04-04 DIAGNOSIS — M4806 Spinal stenosis, lumbar region: Secondary | ICD-10-CM | POA: Insufficient documentation

## 2015-04-04 DIAGNOSIS — R279 Unspecified lack of coordination: Secondary | ICD-10-CM | POA: Diagnosis not present

## 2015-04-04 DIAGNOSIS — M4712 Other spondylosis with myelopathy, cervical region: Secondary | ICD-10-CM | POA: Insufficient documentation

## 2015-04-04 DIAGNOSIS — M4714 Other spondylosis with myelopathy, thoracic region: Secondary | ICD-10-CM | POA: Diagnosis not present

## 2015-04-04 DIAGNOSIS — R29898 Other symptoms and signs involving the musculoskeletal system: Secondary | ICD-10-CM

## 2015-04-04 DIAGNOSIS — R262 Difficulty in walking, not elsewhere classified: Secondary | ICD-10-CM | POA: Diagnosis not present

## 2015-04-04 NOTE — Therapy (Signed)
[facility] [address] [place], [place], [address] Phone: [phone]   Fax:  [phone]  Physical Therapy Evaluation  Patient Details  Name: Travis [name] MRN: [id number] Date of Birth: [birth year] Referring Provider:  Kathrynn Ducking, MD  Encounter Date: 04/04/2015      PT End of Session - 04/04/15 1335    Visit Number 1   Number of Visits 17   Date for PT Re-Evaluation 06/03/15   Authorization Type Medicare-needs G codes every 10th visit   PT Start Time 1239  pt arrived late   PT Stop Time 1325   PT Time Calculation (min) 46 min      Past Medical History  Diagnosis Date  . PONV (postoperative nausea and vomiting)   . Hypertension   . DJD (degenerative joint disease)     WHOLE SPINE  . Hyperthyroidism     Had Iodine to resolve issue DX 10 YR AGO  . Hypothyroidism     Low d/t treatment of hyperthyroidism  . Sleep apnea     DX  2009/2010 study done at [facility]  . Type II diabetes mellitus     diet controlled  . Heart murmur     Evaluated by cardiologist 4-5 years ago but does not currently see a cardiologist  . Gait disorder   . Cervical myelopathy     with myelomalacia at C5-6 and C3-4  . RLS (restless legs syndrome)   . Peripheral neuropathy   . 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 Constipation - functional     controlled with miralax  . RBBB   . 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 CHF (congestive heart failure)   . Pneumonia   . Restless legs syndrome 03/14/2015    Past Surgical History  Procedure Laterality Date  . Cholecystectomy    . Back surgery      6 surgeries  . Lumbar laminectomy/decompression microdiscectomy  07/14/2012    Procedure: LUMBAR LAMINECTOMY/DECOMPRESSION MICRODISCECTOMY 2 LEVELS;  Surgeon: Eustace Moore, MD;  Location: [facility];  Service: Neurosurgery;  Laterality: Left;  Lumbar two three laminectomy, left thoracic four five transpedicular diskectomy     There were no vitals filed for this visit.  Visit Diagnosis:  Difficulty in walking - Plan: PT PLAN OF CARE CERT/RE-CERT  Lack of coordination - Plan: PT PLAN OF CARE CERT/RE-CERT  Weakness of both legs - Plan: PT PLAN OF CARE CERT/RE-CERT      Subjective Assessment - 04/04/15 1246    Subjective Last July Pt was hospitalized due to multiple medical conditions including sepsis and pneumonia. He was previously known to our clinic early this year and had to d/c from therapy due to LLE pain back in February. His pain has been resolved now and he is ready to get back into therapy. No longer having knee buckling either.  Pt is occasionally able to use the cane for short distances and would like to use the cane as primary assistive device. t    Patient Stated Goals Pt's goal for therapy is to ambulate without the walker.    Currently in Pain? No/denies            [facility] PT Assessment - 04/04/15 0001    Assessment   Medical Diagnosis abnormality of gait   Onset Date --  July 2015   Precautions   Precautions Fall   Balance Screen   Has the patient fallen in the  past 6 months No   Has the patient had a decrease in activity level because of a fear of falling?  No   Is the patient reluctant to leave their home because of a fear of falling?  No   Home Environment   Living Enviornment Private residence   Living Arrangements Spouse/significant other   Available Help at Discharge Family   Type of [place] to enter;Ramped entrance   [place] of Steps 2   [place] One level   [place] - 2 wheels;Wheelchair - manual   Prior Function   Level of Independence --  used a cane for ambulation prior to July   Vocation Part time employment   Vocation Requirements working at a computer/desk   Leisure flipping houses   Posture/Postural Control   Posture/Postural Control Postural limitations   Postural  Limitations Forward head;Weight shift right   Ambulation/Gait   Ambulation/Gait Yes   Ambulation/Gait Assistance 5: Supervision;4: Min guard  MIN guard with cane   Ambulation Distance (Feet) 50 Feet  1x50 with RW; 1x50 with SPC   Assistive device Rolling walker;Straight cane   Gait Pattern Trunk flexed;Poor foot clearance - left;Poor foot clearance - right;Decreased step length - right;Decreased step length - left;Decreased hip/knee flexion - right;Decreased hip/knee flexion - left;Trendelenburg;Narrow base of support  scissors on turns   Ambulation Surface Indoor;Level   Gait velocity 1.45 ft/sec with RW  1.03 ft/sec with cane with CGA   Standardized Balance Assessment   Standardized Balance Assessment Berg Balance Test;Timed Up and Go Test   Berg Balance Test   Sit to Stand Able to stand  independently using hands   Standing Unsupported Able to stand safely 2 minutes   Sitting with Back Unsupported but Feet Supported on Floor or Stool Able to sit safely and securely 2 minutes   Stand to Sit Controls descent by using hands   Transfers Able to transfer safely, definite need of hands   Standing Unsupported with Eyes Closed Able to stand 10 seconds with supervision   Standing Ubsupported with Feet Together Able to place feet together independently and stand for 1 minute with supervision   From Standing, Reach Forward with Outstretched Arm Can reach forward >12 cm safely (5")   From Standing Position, Pick up Object from Floor Unable to pick up shoe, but reaches 2-5 cm (1-2") from shoe and balances independently   From Standing Position, Turn to Look Behind Over each Shoulder Needs supervision when turning   Turn 360 Degrees Needs assistance while turning   Standing Unsupported, Alternately Place Feet on Step/Stool Needs assistance to keep from falling or unable to try   Standing Unsupported, One Foot in Front Able to take small step independently and hold 30 seconds   Standing on One Leg  Tries to lift leg/unable to hold 3 seconds but remains standing independently   Total Score 32   Timed Up and Go Test   TUG Normal TUG   Normal TUG (seconds) 25.81  with RW and close supervision, scissors on turn      Self Care: Therapist educated pt on how to increase the intensity of his cycling program with increased resistance, and expl;ained and demonstrated how to focus on hip/ankle flexors rather than extensors while working on the stationary bike. Also discussed pt's current status compared to previous D/C status  PT Short Term Goals - April 16, 2015 1341    PT SHORT TERM GOAL #1   Title Independent with home exercise program to address lower extremity and core weakness and balance impairment. Target: 05/04/15   PT SHORT TERM GOAL #2   Title Increase Berg Balance Test score to 38/56. Target: 05/04/15   PT SHORT TERM GOAL #3   Title Decrease TUG time to 22 seconds with LRAD. Target: 05/04/15   PT SHORT TERM GOAL #4   Title Increase gait speed with cane to 1.35 ft/sec for increased efficiency with ambulation.   PT SHORT TERM GOAL #5   Title Ambulate 300' with cane on indoor, level surfaces with supervision without scissoring while turning for improving independence with ambulation.           PT Long Term Goals - 2015-04-16 1345    PT LONG TERM GOAL #1   Title Increase Berg Balance Test score to >45/56 for decreased fall risk. Target 06/03/15   PT LONG TERM GOAL #2   Title Decrease TUG time to 19 seconds with LRAD without scissoring during turn. Target 06/03/15   PT LONG TERM GOAL #3   Title Increase gait speed with cane on level surfaces to >1.8 ft/sec for decreased fall risk. Target 06/03/15   PT LONG TERM GOAL #4   Title Ambulate 1000' with single point cane and MOD I for increased efficiency and independence with ambulation. Target 06/03/15   PT LONG TERM GOAL #5   Title Complete Activities Balance Confidence Test on paper and write appropriate  LTG:___________________               Plan - 04-16-2015 1336    Clinical Impression Statement Pt is a 69 year old male, previously known to our clinic. He d/c'd from previous episode of care due to L leg pain which has now resolved and he has returned after a 2-3 month break. Pt demonstrated improvement in muscle strength and stability when ambulating with cane, but decline in balance on standardized measures today compared to prior d/c from PT. Pt expected tp make progress with skilled PT. To maximize safety and functional independence.   Pt will benefit from skilled therapeutic intervention in order to improve on the following deficits Abnormal gait;Decreased coordination;Difficulty walking;Impaired flexibility;Decreased endurance;Decreased balance;Decreased strength   Rehab Potential Good   PT Frequency 2x / week   PT Duration 8 weeks   PT Treatment/Interventions ADLs/Self Care Home Management;Therapeutic activities;Patient/family education;DME Instruction;Therapeutic exercise;Gait training;Balance training;Stair training;Neuromuscular re-education;Functional mobility training   PT Next Visit Plan *Compete paper copy of ABC survey and write appropriate LTG* HEP to include hip flexor strength, hip abductor strength, turning training, high knee marching, high knee side stepping   Consulted and Agree with Plan of Care Patient          G-Codes - 04/16/2015 1352    Functional Assessment Tool Used Merrilee Jansky 32/56   Functional Limitation Mobility: Walking and moving around   Mobility: Walking and Moving Around Current Status [phone]) At least 20 percent but less than 40 percent impaired, limited or restricted   Mobility: Walking and Moving Around Goal Status [phone]) At least 1 percent but less than 20 percent impaired, limited or restricted       Problem List Patient Active Problem List   Diagnosis Date Noted  . Restless legs syndrome 03/14/2015  . DOE (dyspnea on exertion) 03/09/2015  .  Sinus bradycardia 09/21/2014  . Thoracic spondylosis with myelopathy 09/13/2014  . Acute diastolic heart failure  07/18/2014  . Cervical arthritis with myelopathy 07/18/2014  . Constipation - functional   . CAP (community acquired pneumonia) 07/10/2014  . Sepsis 07/10/2014  . HTN (hypertension) 07/10/2014  . OSA on CPAP 07/10/2014  . UTI (lower urinary tract infection) 07/10/2014  . Edema leg 07/10/2014  . Acute respiratory failure with hypoxia 07/10/2014  . 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 Other vitamin B12 deficiency anemia 07/13/2013  . 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 Polyneuropathy in other diseases classified elsewhere 02/17/2013  . Other general symptoms(780.99) 02/17/2013  . Abnormality of gait 02/17/2013  . Cervical spondylosis with myelopathy 02/17/2013    Delrae Sawyers D 04/04/2015, 5:09 PM  [facility] [address] [place], [place], [address] Phone: [phone]   Fax:  [phone]

## 2015-04-11 ENCOUNTER — Ambulatory Visit: Payer: Medicare Other

## 2015-04-11 DIAGNOSIS — R29898 Other symptoms and signs involving the musculoskeletal system: Secondary | ICD-10-CM

## 2015-04-11 DIAGNOSIS — Z5189 Encounter for other specified aftercare: Secondary | ICD-10-CM | POA: Diagnosis not present

## 2015-04-11 NOTE — Therapy (Signed)
[facility] [address] [place], [place], [address] Phone: [phone]   Fax:  [phone]  Physical Therapy Treatment  Patient Details  Name: Travis [name] MRN: [id number] Date of Birth: Jul 10, 1946 Referring Provider:  [name], MD  Encounter Date: 04/11/2015      PT End of Session - 04/11/15 1201    Visit Number 2   Number of Visits 17   Date for PT Re-Evaluation 06/03/15   Authorization Type Medicare-needs G codes every 10th visit   PT Start Time 1105   PT Stop Time 1145   PT Time Calculation (min) 40 min      Past Medical History  Diagnosis Date  . PONV (postoperative nausea and vomiting)   . Hypertension   . DJD (degenerative joint disease)     WHOLE SPINE  . Hyperthyroidism     Had Iodine to resolve issue DX 10 YR AGO  . Hypothyroidism     Low d/t treatment of hyperthyroidism  . Sleep apnea     DX  2009/2010 study done at [facility]  . Type II diabetes mellitus     diet controlled  . Heart murmur     Evaluated by cardiologist 4-5 years ago but does not currently see a cardiologist  . Gait disorder   . Cervical myelopathy     with myelomalacia at C5-6 and C3-4  . RLS (restless legs syndrome)   . Peripheral neuropathy   . 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 Constipation - functional     controlled with miralax  . RBBB   . 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 CHF (congestive heart failure)   . Pneumonia   . Restless legs syndrome 03/14/2015    Past Surgical History  Procedure Laterality Date  . Cholecystectomy    . Back surgery      6 surgeries  . Lumbar laminectomy/decompression microdiscectomy  07/14/2012    Procedure: LUMBAR LAMINECTOMY/DECOMPRESSION MICRODISCECTOMY 2 LEVELS;  Surgeon: Eustace Moore, MD;  Location: [facility];  Service: Neurosurgery;  Laterality: Left;  Lumbar two three laminectomy, left thoracic four five transpedicular diskectomy    There were no  vitals filed for this visit.  Visit Diagnosis:  Weakness of both legs      Subjective Assessment - 04/11/15 1111    Subjective currently lateral walking at counter and heel raises, calf stretch, wall squats, heel walking   Currently in Pain? No/denies      -Taught strengthening HEP-see handout. Pt performed all exercises today -Attempted clamshells but pt unable to isolate gluteus medius despite assistance with form. --Single limb squats 1x10 each leg with BUE support on RW                         PT Education - 04/11/15 1201    Education provided Yes   Education Details HEP   Person(s) Educated Patient   Methods Explanation;Demonstration;Handout   Comprehension Verbalized understanding;Returned demonstration          PT Short Term Goals - 04/04/15 1341    PT SHORT TERM GOAL #1   Title Independent with home exercise program to address lower extremity and core weakness and balance impairment. Target: 05/04/15   PT SHORT TERM GOAL #2   Title Increase Berg Balance Test score to 38/56. Target: 05/04/15   PT SHORT TERM GOAL #3   Title Decrease TUG time to 22 seconds with LRAD.  Target: 05/04/15   PT SHORT TERM GOAL #4   Title Increase gait speed with cane to 1.35 ft/sec for increased efficiency with ambulation.   PT SHORT TERM GOAL #5   Title Ambulate 300' with cane on indoor, level surfaces with supervision without scissoring while turning for improving independence with ambulation.           PT Long Term Goals - 04/04/15 1345    PT LONG TERM GOAL #1   Title Increase Berg Balance Test score to >45/56 for decreased fall risk. Target 06/03/15   PT LONG TERM GOAL #2   Title Decrease TUG time to 19 seconds with LRAD without scissoring during turn. Target 06/03/15   PT LONG TERM GOAL #3   Title Increase gait speed with cane on level surfaces to >1.8 ft/sec for decreased fall risk. Target 06/03/15   PT LONG TERM GOAL #4   Title Ambulate 1000' with single point cane  and MOD I for increased efficiency and independence with ambulation. Target 06/03/15   PT LONG TERM GOAL #5   Title Complete Activities Balance Confidence Test on paper and write appropriate LTG:___________________               Plan - 04/11/15 1202    Clinical Impression Statement Pt was provided with HEP today. Demonstrated improved quads strength able to perform single limb squats with BUE support today and sit to stands without UE support. Continue per plan of care   PT Next Visit Plan complete paper ABC and goal, cane training        Problem List Patient Active Problem List   Diagnosis Date Noted  . Restless legs syndrome 03/14/2015  . DOE (dyspnea on exertion) 03/09/2015  . Sinus bradycardia 09/21/2014  . Thoracic spondylosis with myelopathy 09/13/2014  . Acute diastolic heart failure [id number]  . Cervical arthritis with myelopathy 07/18/2014  . Constipation - functional   . CAP (community acquired pneumonia) 07/10/2014  . Sepsis 07/10/2014  . HTN (hypertension) 07/10/2014  . OSA on CPAP 07/10/2014  . UTI (lower urinary tract infection) 07/10/2014  . Edema leg 07/10/2014  . Acute respiratory failure with hypoxia 07/10/2014  . 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 Other vitamin B12 deficiency anemia 07/13/2013  . 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 Polyneuropathy in other diseases classified elsewhere 02/17/2013  . Other general symptoms(780.99) 02/17/2013  . Abnormality of gait 02/17/2013  . Cervical spondylosis with myelopathy 02/17/2013   Delrae Sawyers, PT,DPT,NCS 04/11/2015 12:09 PM Phone [phone] FAX [phone]         [facility] [address] [place], [place], [address] Phone: [phone]   Fax:  [phone]

## 2015-04-11 NOTE — Patient Instructions (Signed)
   High knee side stepping- Replace your side stepping at the counter exercises to perform high knee side stepping instead. Hold counter, and step to the side with a high knee march. Walk all the way to the end of the counter, then in the other direction to the other end. Perform 3 laps.   Hip abduction Lay on your back on your bed (you can do this before you get out of bed in the morning). Bend both knees up with your feet on the bed. Turn feet outward, keeping heels together. With your knees together, tie a theraband around the knees. Now, pull your knees apart against the resistance of the theraband (keeping toes turned out). Perform 3 sets of 10. You should feel this one working in your buttock muscle.    Knee Extension: Sit to Stand (Eccentric)   Stand close to chair. Slowly lower self to seated position, then reach forward and lean forward to stand up without using your hand. Perform 10x daily. Progress to stopping midway before lowering to chair. Progress to barely touching chair.  Copyright  VHI. All rights reserved.    Turning in Place: Solid Surface   Standing in place, lead with head and turn slowly making quarter turns toward left. Do this with counter in front of you and the back of a chair behind you to hold on as needed. Perform 3x to each side daily Copyright  VHI. All rights reserved.

## 2015-04-13 ENCOUNTER — Ambulatory Visit: Payer: Medicare Other | Admitting: Physical Therapy

## 2015-04-13 ENCOUNTER — Other Ambulatory Visit: Payer: Self-pay | Admitting: Physical Medicine & Rehabilitation

## 2015-04-13 ENCOUNTER — Encounter: Payer: Self-pay | Admitting: Physical Therapy

## 2015-04-13 DIAGNOSIS — Z5189 Encounter for other specified aftercare: Secondary | ICD-10-CM | POA: Diagnosis not present

## 2015-04-13 DIAGNOSIS — R279 Unspecified lack of coordination: Secondary | ICD-10-CM

## 2015-04-13 DIAGNOSIS — R262 Difficulty in walking, not elsewhere classified: Secondary | ICD-10-CM

## 2015-04-13 DIAGNOSIS — R269 Unspecified abnormalities of gait and mobility: Secondary | ICD-10-CM

## 2015-04-13 DIAGNOSIS — R29898 Other symptoms and signs involving the musculoskeletal system: Secondary | ICD-10-CM

## 2015-04-13 NOTE — Therapy (Signed)
[facility] [address] [place], [place], [address] Phone: [phone]   Fax:  [phone]  Physical Therapy Treatment  Patient Details  Name: Travis [name] MRN: [id number] Date of Birth: [birth year] Referring Provider:  [name], MD  Encounter Date: 04/13/2015     04/13/15 1107  PT Visits / Re-Eval  Visit Number 3  Number of Visits 17  Date for PT Re-Evaluation 06/03/15  Authorization  Authorization Type Medicare-needs G codes every 10th visit  PT Time Calculation  PT Start Time 1102  PT Stop Time 1145  PT Time Calculation (min) 43 min    Past Medical History  Diagnosis Date  . PONV (postoperative nausea and vomiting)   . Hypertension   . DJD (degenerative joint disease)     WHOLE SPINE  . Hyperthyroidism     Had Iodine to resolve issue DX 10 YR AGO  . Hypothyroidism     Low d/t treatment of hyperthyroidism  . Sleep apnea     DX  2009/2010 study done at [facility]  . Type II diabetes mellitus     diet controlled  . Heart murmur     Evaluated by cardiologist 4-5 years ago but does not currently see a cardiologist  . Gait disorder   . Cervical myelopathy     with myelomalacia at C5-6 and C3-4  . RLS (restless legs syndrome)   . Peripheral neuropathy   . 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 Constipation - functional     controlled with miralax  . RBBB   . 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 CHF (congestive heart failure)   . Pneumonia   . Restless legs syndrome 03/14/2015    Past Surgical History  Procedure Laterality Date  . Cholecystectomy    . Back surgery      6 surgeries  . Lumbar laminectomy/decompression microdiscectomy  07/14/2012    Procedure: LUMBAR LAMINECTOMY/DECOMPRESSION MICRODISCECTOMY 2 LEVELS;  Surgeon: Eustace Moore, MD;  Location: [place] [facility];  Service: Neurosurgery;  Laterality: Left;  Lumbar two three laminectomy, left thoracic four five transpedicular  diskectomy    There were no vitals filed for this visit.  Visit Diagnosis:  Weakness of both legs  Difficulty in walking  Lack of coordination  Abnormality of gait      04/13/15 1107  Ambulation/Gait  Ambulation/Gait Yes  Ambulation/Gait Assistance 4: Min guard;4: Min assist  Ambulation/Gait Assistance Details cues on posture and increased/equal step length with improved foot clearance with all gait. cues on weight shift and stance stability with gait with straight cane in parallel bars.                    Ambulation Distance (Feet) 80 Feet (x 2 with walker, plus 8 laps in parallel bars)  Assistive device Parallel bars;Straight cane;Rolling walker (straight cane in parallel bars)  Gait Pattern Trunk flexed;Poor foot clearance - left;Poor foot clearance - right;Decreased step length - right;Decreased step length - left;Decreased hip/knee flexion - right;Decreased hip/knee flexion - left;Trendelenburg;Narrow base of support  Ambulation Surface Level;Indoor     Treatment: Exercise: cues on correct exercise form/technique needed Sit/stands no UE assist x 10 reps Sit/stands with OH press x 10 reps Long arc quads with 2# ankle weights 5 sec hold x 10 reps each leg Alternating marches with 2# ankle weights 5 sec hold x 10 reps each leg Standing with support on elevated mat table and bil 2# ankle  weights - heel raises 5 sec hold x 10 reps - toe raises 5 sec hold x 10 reps - alternating hamstring curls x 10 each leg Seated no weight: ankle DF hold with knee extension 5 sec hold x 10 reps Seated hamstring stretch 30 sec's x 3 each side.        PT Short Term Goals - 04/04/15 1341    PT SHORT TERM GOAL #1   Title Independent with home exercise program to address lower extremity and core weakness and balance impairment. Target: 05/04/15   PT SHORT TERM GOAL #2   Title Increase Berg Balance Test score to 38/56. Target: 05/04/15   PT SHORT TERM GOAL #3   Title Decrease TUG time to 22  seconds with LRAD. Target: 05/04/15   PT SHORT TERM GOAL #4   Title Increase gait speed with cane to 1.35 ft/sec for increased efficiency with ambulation.   PT SHORT TERM GOAL #5   Title Ambulate 300' with cane on indoor, level surfaces with supervision without scissoring while turning for improving independence with ambulation.           PT Long Term Goals - 04/04/15 1345    PT LONG TERM GOAL #1   Title Increase Berg Balance Test score to >45/56 for decreased fall risk. Target 06/03/15   PT LONG TERM GOAL #2   Title Decrease TUG time to 19 seconds with LRAD without scissoring during turn. Target 06/03/15   PT LONG TERM GOAL #3   Title Increase gait speed with cane on level surfaces to >1.8 ft/sec for decreased fall risk. Target 06/03/15   PT LONG TERM GOAL #4   Title Ambulate 1000' with single point cane and MOD I for increased efficiency and independence with ambulation. Target 06/03/15   PT LONG TERM GOAL #5   Title Complete Activities Balance Confidence Test on paper and write appropriate LTG:___________________        04/13/15 1107  Plan  Clinical Impression Statement Initiated gait with single point cane in parallel bars with min assist and cues on posture and weight shifting with stance. Continued with strengthening with no issues report. Pt progressing toward goals.          Pt will benefit from skilled therapeutic intervention in order to improve on the following deficits Abnormal gait;Decreased coordination;Difficulty walking;Impaired flexibility;Decreased endurance;Decreased balance;Decreased strength  Rehab Potential Good  PT Frequency 2x / week  PT Duration 8 weeks  PT Treatment/Interventions ADLs/Self Care Home Management;Therapeutic activities;Patient/family education;DME Instruction;Therapeutic exercise;Gait training;Balance training;Stair training;Neuromuscular re-education;Functional mobility training  PT Next Visit Plan *Compete paper copy of ABC survey and write appropriate  LTG* Continue with gait with straight cane and strengthening  Consulted and Agree with Plan of Care Patient    Problem List Patient Active Problem List   Diagnosis Date Noted  . Restless legs syndrome 03/14/2015  . DOE (dyspnea on exertion) 03/09/2015  . Sinus bradycardia 09/21/2014  . Thoracic spondylosis with myelopathy 09/13/2014  . Acute diastolic heart failure 123XX123  . Cervical arthritis with myelopathy 07/18/2014  . Constipation - functional   . CAP (community acquired pneumonia) 07/10/2014  . Sepsis 07/10/2014  . HTN (hypertension) 07/10/2014  . OSA on CPAP 07/10/2014  . UTI (lower urinary tract infection) 07/10/2014  . Edema leg 07/10/2014  . Acute respiratory failure with hypoxia 07/10/2014  . 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 Other vitamin B12 deficiency anemia 07/13/2013  . 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 Polyneuropathy in  other diseases classified elsewhere 02/17/2013  . Other general symptoms(780.99) 02/17/2013  . Abnormality of gait 02/17/2013  . Cervical spondylosis with myelopathy 02/17/2013    Willow Ora 04/15/2015, 11:19 PM  Willow Ora, PTA, [facility] [address] [place], [place] [address] [phone] 04/15/2015, 11:19 PM

## 2015-04-14 ENCOUNTER — Other Ambulatory Visit: Payer: Self-pay | Admitting: Physical Medicine & Rehabilitation

## 2015-04-16 ENCOUNTER — Encounter: Payer: Self-pay | Admitting: Cardiovascular Disease

## 2015-04-18 ENCOUNTER — Ambulatory Visit: Payer: Medicare Other | Admitting: Physical Therapy

## 2015-04-18 ENCOUNTER — Encounter: Payer: Self-pay | Admitting: Physical Therapy

## 2015-04-18 DIAGNOSIS — R262 Difficulty in walking, not elsewhere classified: Secondary | ICD-10-CM

## 2015-04-18 DIAGNOSIS — R29898 Other symptoms and signs involving the musculoskeletal system: Secondary | ICD-10-CM

## 2015-04-18 DIAGNOSIS — Z5189 Encounter for other specified aftercare: Secondary | ICD-10-CM | POA: Diagnosis not present

## 2015-04-18 DIAGNOSIS — R279 Unspecified lack of coordination: Secondary | ICD-10-CM

## 2015-04-18 DIAGNOSIS — R269 Unspecified abnormalities of gait and mobility: Secondary | ICD-10-CM

## 2015-04-18 NOTE — Therapy (Signed)
[facility] [address] [place], [place], [address] Phone: [phone]   Fax:  [phone]  Physical Therapy Treatment  Patient Details  Name: Travis [name] MRN: [id number] Date of Birth: [birth year] Referring Provider:  [name], MD  Encounter Date: 04/18/2015      PT End of Session - 04/18/15 1106    Visit Number 4   Number of Visits 17   Date for PT Re-Evaluation 06/03/15   Authorization Type Medicare-needs G codes every 10th visit   PT Start Time 1103   PT Stop Time 1145   PT Time Calculation (min) 42 min      Past Medical History  Diagnosis Date  . PONV (postoperative nausea and vomiting)   . Hypertension   . DJD (degenerative joint disease)     WHOLE SPINE  . Hyperthyroidism     Had Iodine to resolve issue DX 10 YR AGO  . Hypothyroidism     Low d/t treatment of hyperthyroidism  . Sleep apnea     DX  2009/2010 study done at [facility]  . Type II diabetes mellitus     diet controlled  . Heart murmur     Evaluated by cardiologist 4-5 years ago but does not currently see a cardiologist  . Gait disorder   . Cervical myelopathy     with myelomalacia at C5-6 and C3-4  . RLS (restless legs syndrome)   . Peripheral neuropathy   . 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 Constipation - functional     controlled with miralax  . RBBB   . 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 CHF (congestive heart failure)   . Pneumonia   . Restless legs syndrome 03/14/2015    Past Surgical History  Procedure Laterality Date  . Cholecystectomy    . Back surgery      6 surgeries  . Lumbar laminectomy/decompression microdiscectomy  07/14/2012    Procedure: LUMBAR LAMINECTOMY/DECOMPRESSION MICRODISCECTOMY 2 LEVELS;  Surgeon: Eustace Moore, MD;  Location: [place] [facility];  Service: Neurosurgery;  Laterality: Left;  Lumbar two three laminectomy, left thoracic four five transpedicular diskectomy    There were no  vitals filed for this visit.  Visit Diagnosis:  Difficulty in walking  Weakness of both legs  Lack of coordination  Abnormality of gait      Subjective Assessment - 04/18/15 1105    Subjective No new complaints. No falls or pain to report. Was sore after last session, couldn't do his HEP for 2 days.   Currently in Pain? No/denies   Pain Score 0-No pain      Treatment: Exercise: cues on correct exercise form/technique needed Seated on low mat on air ex (with pillow case) sit/stands with OH press with 2# yellow ball  x 10 reps Long arc quads with 2# ankle weights 5 sec hold x 10 reps each leg  Standing with light UE support on chair back and bil 2# ankle weights - heel raises 5 sec hold x 10 reps - toe raises 5 sec hold x 10 reps - alternating hamstring curls x 10 each leg  Hook lying on mat with blue band around legs above knees: hip abduction/ER 5 sec hold x 10 reps   Gait - 115 feet with straight cane and min to mod assist. Cues on posture, increased step length with each leg, increased foot clearance with each leg. Pt with occasional scissor stepping during gait.  PT Short Term Goals - 04/04/15 1341    PT SHORT TERM GOAL #1   Title Independent with home exercise program to address lower extremity and core weakness and balance impairment. Target: 05/04/15   PT SHORT TERM GOAL #2   Title Increase Berg Balance Test score to 38/56. Target: 05/04/15   PT SHORT TERM GOAL #3   Title Decrease TUG time to 22 seconds with LRAD. Target: 05/04/15   PT SHORT TERM GOAL #4   Title Increase gait speed with cane to 1.35 ft/sec for increased efficiency with ambulation.   PT SHORT TERM GOAL #5   Title Ambulate 300' with cane on indoor, level surfaces with supervision without scissoring while turning for improving independence with ambulation.           PT Long Term Goals - 04/04/15 1345    PT LONG TERM GOAL #1   Title Increase Berg Balance Test score to >45/56 for  decreased fall risk. Target 06/03/15   PT LONG TERM GOAL #2   Title Decrease TUG time to 19 seconds with LRAD without scissoring during turn. Target 06/03/15   PT LONG TERM GOAL #3   Title Increase gait speed with cane on level surfaces to >1.8 ft/sec for decreased fall risk. Target 06/03/15   PT LONG TERM GOAL #4   Title Ambulate 1000' with single point cane and MOD I for increased efficiency and independence with ambulation. Target 06/03/15   PT LONG TERM GOAL #5   Title Complete Activities Balance Confidence Test on paper and write appropriate LTG:___________________           Plan - 04/18/15 1107    Clinical Impression Statement Pt able to increase gait distance today with straight cane with assist. Continued with strengthening exercises. Pt progressing toward goals.   Pt will benefit from skilled therapeutic intervention in order to improve on the following deficits Abnormal gait;Decreased coordination;Difficulty walking;Impaired flexibility;Decreased endurance;Decreased balance;Decreased strength   Rehab Potential Good   PT Frequency 2x / week   PT Duration 8 weeks   PT Treatment/Interventions ADLs/Self Care Home Management;Therapeutic activities;Patient/family education;DME Instruction;Therapeutic exercise;Gait training;Balance training;Stair training;Neuromuscular re-education;Functional mobility training   PT Next Visit Plan *Compete paper copy of ABC survey and write appropriate LTG* Continue with gait with straight cane and strengthening   Consulted and Agree with Plan of Care Patient        Problem List Patient Active Problem List   Diagnosis Date Noted  . Restless legs syndrome 03/14/2015  . DOE (dyspnea on exertion) 03/09/2015  . Sinus bradycardia 09/21/2014  . Thoracic spondylosis with myelopathy 09/13/2014  . Acute diastolic heart failure 123XX123  . Cervical arthritis with myelopathy 07/18/2014  . Constipation - functional   . CAP (community acquired pneumonia)  07/10/2014  . Sepsis 07/10/2014  . HTN (hypertension) 07/10/2014  . OSA on CPAP 07/10/2014  . UTI (lower urinary tract infection) 07/10/2014  . Edema leg 07/10/2014  . Acute respiratory failure with hypoxia 07/10/2014  . 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 Other vitamin B12 deficiency anemia 07/13/2013  . 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 Polyneuropathy in other diseases classified elsewhere 02/17/2013  . Other general symptoms(780.99) 02/17/2013  . Abnormality of gait 02/17/2013  . Cervical spondylosis with myelopathy 02/17/2013    Willow Ora 04/18/2015, 8:36 PM  Willow Ora, PTA, [facility] [address], [place], [place] [address] [phone] 04/18/2015, 8:36 PM

## 2015-04-20 ENCOUNTER — Ambulatory Visit: Payer: Medicare Other

## 2015-04-20 DIAGNOSIS — Z5189 Encounter for other specified aftercare: Secondary | ICD-10-CM | POA: Diagnosis not present

## 2015-04-20 DIAGNOSIS — R262 Difficulty in walking, not elsewhere classified: Secondary | ICD-10-CM

## 2015-04-20 DIAGNOSIS — R29898 Other symptoms and signs involving the musculoskeletal system: Secondary | ICD-10-CM

## 2015-04-20 NOTE — Therapy (Addendum)
[facility] [address] [place], [place], [address] Phone: [phone]   Fax:  [phone]  Physical Therapy Treatment  Patient Details  Name: Travis [name] MRN: [id number] Date of Birth: 1946-10-30 Referring Provider:  [name], MD  Encounter Date: 04/20/2015      PT End of Session - 04/20/15 1633    Visit Number 5   Number of Visits 17   Date for PT Re-Evaluation 06/03/15   Authorization Type Medicare-needs G codes every 10th visit   PT Start Time 1535   PT Stop Time 1615   PT Time Calculation (min) 40 min      Past Medical History  Diagnosis Date  . PONV (postoperative nausea and vomiting)   . Hypertension   . DJD (degenerative joint disease)     WHOLE SPINE  . Hyperthyroidism     Had Iodine to resolve issue DX 10 YR AGO  . Hypothyroidism     Low d/t treatment of hyperthyroidism  . Sleep apnea     DX  2009/2010 study done at [facility]  . Type II diabetes mellitus     diet controlled  . Heart murmur     Evaluated by cardiologist 4-5 years ago but does not currently see a cardiologist  . Gait disorder   . Cervical myelopathy     with myelomalacia at C5-6 and C3-4  . RLS (restless legs syndrome)   . Peripheral neuropathy   . 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 Constipation - functional     controlled with miralax  . RBBB   . 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 CHF (congestive heart failure)   . Pneumonia   . Restless legs syndrome 03/14/2015    Past Surgical History  Procedure Laterality Date  . Cholecystectomy    . Back surgery      6 surgeries  . Lumbar laminectomy/decompression microdiscectomy  07/14/2012    Procedure: LUMBAR LAMINECTOMY/DECOMPRESSION MICRODISCECTOMY 2 LEVELS;  Surgeon: Eustace Moore, MD;  Location: [facility];  Service: Neurosurgery;  Laterality: Left;  Lumbar two three laminectomy, left thoracic four five transpedicular diskectomy    There were no  vitals filed for this visit.  Visit Diagnosis:  Difficulty in walking  Weakness of both legs      Subjective Assessment - 04/20/15 1538    Subjective Feeling weaker today.    Currently in Pain? Yes   Pain Score 2    Pain Location --  between shoulder blades   Pain Orientation Mid   Pain Descriptors / Indicators Sore   Pain Type Chronic pain   Pain Onset More than a month ago   Pain Frequency Occasional   Aggravating Factors  exercise   Pain Relieving Factors rest      Therex: 3x30 seconds seated hip adductor stretch 3x30 seconds seated hamstring stretch 3x10 straight leg raises each leg  Gait training: -With RW upon entering gym x75' with MOD I with notable scissoring. -With single point cane and CGA x75' with decreased hip flexion, decreased pre swing, improved heel strike compared to prior sessions. MIN A to steady -High knee marching 5 laps at 10' counter with single UE support demonstrating good hip flexion, heel strike, pre swing and toe off and decreased scissoring. -retro walking 5 laps at 10' counter with emphasis on increased knee flexion, improved posterior weight shift prior to attempting to lift swing leg, verbal cues to decrease scissoring -With single point  cane x50' then 71' with MIN/MOD A to steady and facilitate glut med.  After session time: Completed Activities Balance Confidence test on paper 43.44% confidence.                              PT Short Term Goals - 04/04/15 1341    PT SHORT TERM GOAL #1   Title Independent with home exercise program to address lower extremity and core weakness and balance impairment. Target: 05/04/15   PT SHORT TERM GOAL #2   Title Increase Berg Balance Test score to 38/56. Target: 05/04/15   PT SHORT TERM GOAL #3   Title Decrease TUG time to 22 seconds with LRAD. Target: 05/04/15   PT SHORT TERM GOAL #4   Title Increase gait speed with cane to 1.35 ft/sec for increased efficiency with ambulation.    PT SHORT TERM GOAL #5   Title Ambulate 300' with cane on indoor, level surfaces with supervision without scissoring while turning for improving independence with ambulation.           PT Long Term Goals - 04/20/15 1720    PT LONG TERM GOAL #1   Title Increase Berg Balance Test score to >45/56 for decreased fall risk. Target 06/03/15   PT LONG TERM GOAL #2   Title Decrease TUG time to 19 seconds with LRAD without scissoring during turn. Target 06/03/15   PT LONG TERM GOAL #3   Title Increase gait speed with cane on level surfaces to >1.8 ft/sec for decreased fall risk. Target 06/03/15   PT LONG TERM GOAL #4   Title Ambulate 1000' with single point cane and MOD I for increased efficiency and independence with ambulation. Target 06/03/15   PT LONG TERM GOAL #5   Title Increase score on paper copy of [employer] Balance Confidence test to 55% for improved balance confidence. [place] 06/03/15   PT LONG TERM GOAL #6   Title ------------------------------   PT LONG TERM GOAL #7   Title ------------------------------   PT LONG TERM GOAL #8   Title -----------------------------------               Plan - 04/20/15 1634    Clinical Impression Statement Pt demonstrated increased stability with straight cane today. Continues to have significant hip flexor weakness, scissoring of BLE but progressing toward goals.   PT Next Visit Plan gait with straight cane, hip flexor and abductor strength, hip adductor flexibility, single limb stance activities        Problem List Patient Active Problem List   Diagnosis Date Noted  . Restless legs syndrome 03/14/2015  . DOE (dyspnea on exertion) 03/09/2015  . Sinus bradycardia 09/21/2014  . Thoracic spondylosis with myelopathy 09/13/2014  . Acute diastolic heart failure 123XX123  . Cervical arthritis with myelopathy 07/18/2014  . Constipation - functional   . CAP (community acquired pneumonia) 07/10/2014  . Sepsis 07/10/2014  . HTN (hypertension)  07/10/2014  . OSA on CPAP 07/10/2014  . UTI (lower urinary tract infection) 07/10/2014  . Edema leg 07/10/2014  . Acute respiratory failure with hypoxia 07/10/2014  . 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 Other vitamin B12 deficiency anemia 07/13/2013  . 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 Polyneuropathy in other diseases classified elsewhere 02/17/2013  . Other general symptoms(780.99) 02/17/2013  . Abnormality of gait 02/17/2013  . Cervical spondylosis with myelopathy 02/17/2013    Delrae Sawyers, PT,DPT,NCS 04/20/2015 5:22 PM Phone (  [id number] [facility] ([place] [address] [place], [place], [address] Phone: [phone]   Fax:  [phone]

## 2015-04-20 NOTE — Patient Instructions (Signed)
Straight Leg: with Bent Knee (Supine)   With right leg straight, other leg bent, raise straight leg 12-16 inches. Repeat 10 times per set. Do 3 sets per session. Do 1 session every other day  http://orth.exer.us/686   Copyright  VHI. All rights reserved.

## 2015-04-25 ENCOUNTER — Ambulatory Visit: Payer: Medicare Other | Admitting: Physical Therapy

## 2015-04-25 ENCOUNTER — Encounter: Payer: Self-pay | Admitting: Physical Therapy

## 2015-04-25 DIAGNOSIS — R269 Unspecified abnormalities of gait and mobility: Secondary | ICD-10-CM

## 2015-04-25 DIAGNOSIS — R262 Difficulty in walking, not elsewhere classified: Secondary | ICD-10-CM

## 2015-04-25 DIAGNOSIS — Z5189 Encounter for other specified aftercare: Secondary | ICD-10-CM | POA: Diagnosis not present

## 2015-04-25 DIAGNOSIS — R29898 Other symptoms and signs involving the musculoskeletal system: Secondary | ICD-10-CM

## 2015-04-25 NOTE — Therapy (Signed)
[facility] [address] [place], [place], [address] Phone: [phone]   Fax:  [phone]  Physical Therapy Treatment  Patient Details  Name: Travis [name] MRN: [id number] Date of Birth: 1946/08/18 Referring Provider:  [name], MD  Encounter Date: 04/25/2015      PT End of Session - 04/25/15 1407    Visit Number 6   Number of Visits 17   Date for PT Re-Evaluation 06/03/15   Authorization Type Medicare-needs G codes every 10th visit   PT Start Time 1405   PT Stop Time 1445   PT Time Calculation (min) 40 min   Equipment Utilized During Treatment Gait belt   Activity Tolerance Patient tolerated treatment well   Behavior During Therapy [facility] for tasks assessed/performed      Past Medical History  Diagnosis Date  . PONV (postoperative nausea and vomiting)   . Hypertension   . DJD (degenerative joint disease)     WHOLE SPINE  . Hyperthyroidism     Had Iodine to resolve issue DX 10 YR AGO  . Hypothyroidism     Low d/t treatment of hyperthyroidism  . Sleep apnea     DX  2009/2010 study done at [facility]  . Type II diabetes mellitus     diet controlled  . Heart murmur     Evaluated by cardiologist 4-5 years ago but does not currently see a cardiologist  . Gait disorder   . Cervical myelopathy     with myelomalacia at C5-6 and C3-4  . RLS (restless legs syndrome)   . Peripheral neuropathy   . 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 Constipation - functional     controlled with miralax  . RBBB   . 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 CHF (congestive heart failure)   . Pneumonia   . Restless legs syndrome 03/14/2015    Past Surgical History  Procedure Laterality Date  . Cholecystectomy    . Back surgery      6 surgeries  . Lumbar laminectomy/decompression microdiscectomy  07/14/2012    Procedure: LUMBAR LAMINECTOMY/DECOMPRESSION MICRODISCECTOMY 2 LEVELS;  Surgeon: Eustace Moore, MD;  Location:  [place] [facility];  Service: Neurosurgery;  Laterality: Left;  Lumbar two three laminectomy, left thoracic four five transpedicular diskectomy    There were no vitals filed for this visit.  Visit Diagnosis:  Difficulty in walking  Weakness of both legs  Abnormality of gait      Subjective Assessment - 04/25/15 1406    Subjective Reports the leg lifts and the clamshell with band/bridge exercises "set him on fire", so he hasn't been doing these the last few days. No pain currently or falls to report.   Currently in Pain? No/denies   Pain Score 0-No pain     Treatment:  Exercise  - hip abduction/ER while supine with green band (using blue at home currently) 5 sec hold 2 sets of 10 reps. Advised pt to use green band at home as this did not cause as much burning in session with demo today. - straight leg raises, lifting only 1-2 inches off mat, 2 sets of 10 bil legs. Pt lifting much higher at home (above level of bent knee on stationary leg). Advised to decrease the height of his lift to assist with decreased low back pull.   - seated hamstring stretch 30 sec's x 3 each leg   Neuro Re-ed: At counter - side stepping left <>  right with light UE support on counter x 3 laps each way - slow high knee marching with 3 sec holds forward/backwards x 3 laps each way with 1 UE support on counter top - tandem gait forward/backward x 3 laps each way with 1 UE support on counter top  Next to mat - alternating foot taps: forward to 4 inch box x 10 each leg, cross taps to 4 inch box x 10 each leg, both with cane assist and up to min assist for balance.        PT Short Term Goals - 04/04/15 1341    PT SHORT TERM GOAL #1   Title Independent with home exercise program to address lower extremity and core weakness and balance impairment. Target: 05/04/15   PT SHORT TERM GOAL #2   Title Increase Berg Balance Test score to 38/56. Target: 05/04/15   PT SHORT TERM GOAL #3   Title Decrease TUG time to 22 seconds  with LRAD. Target: 05/04/15   PT SHORT TERM GOAL #4   Title Increase gait speed with cane to 1.35 ft/sec for increased efficiency with ambulation.   PT SHORT TERM GOAL #5   Title Ambulate 300' with cane on indoor, level surfaces with supervision without scissoring while turning for improving independence with ambulation.           PT Long Term Goals - 04/20/15 1720    PT LONG TERM GOAL #1   Title Increase Berg Balance Test score to >45/56 for decreased fall risk. Target 06/03/15   PT LONG TERM GOAL #2   Title Decrease TUG time to 19 seconds with LRAD without scissoring during turn. Target 06/03/15   PT LONG TERM GOAL #3   Title Increase gait speed with cane on level surfaces to >1.8 ft/sec for decreased fall risk. Target 06/03/15   PT LONG TERM GOAL #4   Title Ambulate 1000' with single point cane and MOD I for increased efficiency and independence with ambulation. Target 06/03/15   PT LONG TERM GOAL #5   Title Increase score on paper copy of Acitivities Balance Confidence test to 55% for improved balance confidence. Taret 06/03/15   PT LONG TERM GOAL #6   Title ------------------------------   PT LONG TERM GOAL #7   Title ------------------------------   PT LONG TERM GOAL #8   Title -----------------------------------           Plan - 04/25/15 1407    Clinical Impression Statement Pt making steady progress toward goals.    Pt will benefit from skilled therapeutic intervention in order to improve on the following deficits Abnormal gait;Decreased coordination;Difficulty walking;Impaired flexibility;Decreased endurance;Decreased balance;Decreased strength   Rehab Potential Good   PT Frequency 2x / week   PT Duration 8 weeks   PT Treatment/Interventions ADLs/Self Care Home Management;Therapeutic activities;Patient/family education;DME Instruction;Therapeutic exercise;Gait training;Balance training;Stair training;Neuromuscular re-education;Functional mobility training   PT Next Visit Plan  gait with straight cane, hip flexor and abductor strength, hip adductor flexibility, single limb stance activities   Consulted and Agree with Plan of Care Patient        Problem List Patient Active Problem List   Diagnosis Date Noted  . Restless legs syndrome 03/14/2015  . DOE (dyspnea on exertion) 03/09/2015  . Sinus bradycardia 09/21/2014  . Thoracic spondylosis with myelopathy 09/13/2014  . Acute diastolic heart failure 123XX123  . Cervical arthritis with myelopathy 07/18/2014  . Constipation - functional   . CAP (community acquired pneumonia) 07/10/2014  . Sepsis 07/10/2014  .  HTN (hypertension) 07/10/2014  . OSA on CPAP 07/10/2014  . UTI (lower urinary tract infection) 07/10/2014  . Edema leg 07/10/2014  . Acute respiratory failure with hypoxia 07/10/2014  . 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 Other vitamin B12 deficiency anemia 07/13/2013  . 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 Polyneuropathy in other diseases classified elsewhere 02/17/2013  . Other general symptoms(780.99) 02/17/2013  . Abnormality of gait 02/17/2013  . Cervical spondylosis with myelopathy 02/17/2013    Willow Ora 04/26/2015, 7:29 PM  Willow Ora, PTA, [facility] [address] [place], [place] [address] [phone] 04/26/2015, 7:30 PM

## 2015-05-02 ENCOUNTER — Ambulatory Visit: Payer: Medicare Other | Attending: Neurology

## 2015-05-02 DIAGNOSIS — Z5189 Encounter for other specified aftercare: Secondary | ICD-10-CM | POA: Insufficient documentation

## 2015-05-02 DIAGNOSIS — M4712 Other spondylosis with myelopathy, cervical region: Secondary | ICD-10-CM | POA: Diagnosis not present

## 2015-05-02 DIAGNOSIS — M4806 Spinal stenosis, lumbar region: Secondary | ICD-10-CM | POA: Diagnosis not present

## 2015-05-02 DIAGNOSIS — M4714 Other spondylosis with myelopathy, thoracic region: Secondary | ICD-10-CM | POA: Diagnosis not present

## 2015-05-02 DIAGNOSIS — R262 Difficulty in walking, not elsewhere classified: Secondary | ICD-10-CM | POA: Diagnosis not present

## 2015-05-02 DIAGNOSIS — R269 Unspecified abnormalities of gait and mobility: Secondary | ICD-10-CM

## 2015-05-02 DIAGNOSIS — R29898 Other symptoms and signs involving the musculoskeletal system: Secondary | ICD-10-CM

## 2015-05-02 DIAGNOSIS — R279 Unspecified lack of coordination: Secondary | ICD-10-CM | POA: Diagnosis not present

## 2015-05-02 NOTE — Therapy (Signed)
[facility] [address] [place], [place], [address] Phone: [phone]   Fax:  [phone]  Physical Therapy Treatment  Patient Details  Name: Travis [name] MRN: [id number] Date of Birth: 11-Aug-1946 Referring Provider:  [name], MD  Encounter Date: 05/02/2015      PT End of Session - 05/02/15 1454    Visit Number 7   Number of Visits 17   Date for PT Re-Evaluation 06/03/15   Authorization Type Medicare-needs G codes every 10th visit   PT Start Time 1401   PT Stop Time 1445   PT Time Calculation (min) 44 min      Past Medical History  Diagnosis Date  . PONV (postoperative nausea and vomiting)   . Hypertension   . DJD (degenerative joint disease)     WHOLE SPINE  . Hyperthyroidism     Had Iodine to resolve issue DX 10 YR AGO  . Hypothyroidism     Low d/t treatment of hyperthyroidism  . Sleep apnea     DX  2009/2010 study done at [facility]  . Type II diabetes mellitus     diet controlled  . Heart murmur     Evaluated by cardiologist 4-5 years ago but does not currently see a cardiologist  . Gait disorder   . Cervical myelopathy     with myelomalacia at C5-6 and C3-4  . RLS (restless legs syndrome)   . Peripheral neuropathy   . 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 Constipation - functional     controlled with miralax  . RBBB   . 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 CHF (congestive heart failure)   . Pneumonia   . Restless legs syndrome 03/14/2015    Past Surgical History  Procedure Laterality Date  . Cholecystectomy    . Back surgery      6 surgeries  . Lumbar laminectomy/decompression microdiscectomy  07/14/2012    Procedure: LUMBAR LAMINECTOMY/DECOMPRESSION MICRODISCECTOMY 2 LEVELS;  Surgeon: Eustace Moore, MD;  Location: [place] [facility];  Service: Neurosurgery;  Laterality: Left;  Lumbar two three laminectomy, left thoracic four five transpedicular diskectomy    There were no vitals  filed for this visit.  Visit Diagnosis:  Difficulty in walking  Weakness of both legs  Abnormality of gait  Lack of coordination      Subjective Assessment - 05/02/15 1404    Subjective Reports no falls. States that the exercises are continuing to make him burn.    Currently in Pain? No/denies      Discussed performing stretching and walking daily; downgrading the strengthening to every other day to decease the "burning sensation" by having muscle rest days  Gait training with single point cane 4x30' at counter with intermittent UE support on counter with verbalc cues for increased foot clearance (increased hip flexion)  Then progressed to walking away from counter x115' and later x50' with single point cane, again x50' with supervision and decreased scissoring but it appears that he has increased spasticity of the quads with increased difficulty flexing hip and clearing the feet. Verbal cues for increased hip flexion.  Alternate foot taps on 8" step with BUE support on parallel bars, then downgraded to single UE support, then without UE support                           PT Short Term Goals - 04/04/15 1341  PT SHORT TERM GOAL #1   Title Independent with home exercise program to address lower extremity and core weakness and balance impairment. Target: 05/04/15   PT SHORT TERM GOAL #2   Title Increase Berg Balance Test score to 38/56. Target: 05/04/15   PT SHORT TERM GOAL #3   Title Decrease TUG time to 22 seconds with LRAD. Target: 05/04/15   PT SHORT TERM GOAL #4   Title Increase gait speed with cane to 1.35 ft/sec for increased efficiency with ambulation.   PT SHORT TERM GOAL #5   Title Ambulate 300' with cane on indoor, level surfaces with supervision without scissoring while turning for improving independence with ambulation.           PT Long Term Goals - 04/20/15 1720    PT LONG TERM GOAL #1   Title Increase Berg Balance Test score to >45/56 for  decreased fall risk. Target 06/03/15   PT LONG TERM GOAL #2   Title Decrease TUG time to 19 seconds with LRAD without scissoring during turn. Target 06/03/15   PT LONG TERM GOAL #3   Title Increase gait speed with cane on level surfaces to >1.8 ft/sec for decreased fall risk. Target 06/03/15   PT LONG TERM GOAL #4   Title Ambulate 1000' with single point cane and MOD I for increased efficiency and independence with ambulation. Target 06/03/15   PT LONG TERM GOAL #5   Title Increase score on paper copy of Acitivities Balance Confidence test to 55% for improved balance confidence. Taret 06/03/15   PT LONG TERM GOAL #6   Title ------------------------------   PT LONG TERM GOAL #7   Title ------------------------------   PT LONG TERM GOAL #8   Title -----------------------------------               Plan - 05/02/15 1456    Clinical Impression Statement Pt appears to have increased quads spasticity most notable due to decreased ability to flex hips to clear feet while ambulating. Pt demonstrates decreased hip adductor spasticity with decrased scissoring, and incrased stability while standing with cane. Continue per POC   PT Next Visit Plan *Check STGs*Continue gait training with straight cane and single limb stance activities        Problem List Patient Active Problem List   Diagnosis Date Noted  . Restless legs syndrome 03/14/2015  . DOE (dyspnea on exertion) 03/09/2015  . Sinus bradycardia 09/21/2014  . Thoracic spondylosis with myelopathy 09/13/2014  . Acute diastolic heart failure 123XX123  . Cervical arthritis with myelopathy 07/18/2014  . Constipation - functional   . CAP (community acquired pneumonia) 07/10/2014  . Sepsis 07/10/2014  . HTN (hypertension) 07/10/2014  . OSA on CPAP 07/10/2014  . UTI (lower urinary tract infection) 07/10/2014  . Edema leg 07/10/2014  . Acute respiratory failure with hypoxia 07/10/2014  . 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 Other vitamin  B12 deficiency anemia 07/13/2013  . 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 Polyneuropathy in other diseases classified elsewhere 02/17/2013  . Other general symptoms(780.99) 02/17/2013  . Abnormality of gait 02/17/2013  . Cervical spondylosis with myelopathy 02/17/2013    Delrae Sawyers D 05/02/2015, 2:59 PM  [facility] [address] [place], [place], [address] Phone: [phone]   Fax:  [phone]

## 2015-05-04 ENCOUNTER — Encounter: Payer: Self-pay | Admitting: Physical Therapy

## 2015-05-04 ENCOUNTER — Ambulatory Visit: Payer: Medicare Other | Admitting: Physical Therapy

## 2015-05-04 DIAGNOSIS — Z5189 Encounter for other specified aftercare: Secondary | ICD-10-CM | POA: Diagnosis not present

## 2015-05-04 DIAGNOSIS — R262 Difficulty in walking, not elsewhere classified: Secondary | ICD-10-CM

## 2015-05-04 DIAGNOSIS — R269 Unspecified abnormalities of gait and mobility: Secondary | ICD-10-CM

## 2015-05-04 DIAGNOSIS — R29898 Other symptoms and signs involving the musculoskeletal system: Secondary | ICD-10-CM

## 2015-05-07 NOTE — Therapy (Signed)
[facility] [address] [place], [place], [address] Phone: [phone]   Fax:  [phone]  Physical Therapy Treatment  Patient Details  Name: Travis [name] MRN: [id number] Date of Birth: 11/07/1946 Referring Provider:  [name], MD  Encounter Date: 05/04/2015     05/04/15 1522  PT Visits / Re-Eval  Visit Number 8  Number of Visits 17  Date for PT Re-Evaluation 06/03/15  Authorization  Authorization Type Medicare-needs G codes every 10th visit  PT Time Calculation  PT Start Time 1517  PT Stop Time 1600  PT Time Calculation (min) 43 min  PT - End of Session  Equipment Utilized During Treatment Gait belt  Activity Tolerance Patient tolerated treatment well  Behavior During Therapy [facility] for tasks assessed/performed    Past Medical History  Diagnosis Date  . PONV (postoperative nausea and vomiting)   . Hypertension   . DJD (degenerative joint disease)     WHOLE SPINE  . Hyperthyroidism     Had Iodine to resolve issue DX 10 YR AGO  . Hypothyroidism     Low d/t treatment of hyperthyroidism  . Sleep apnea     DX  2009/2010 study done at [facility]  . Type II diabetes mellitus     diet controlled  . Heart murmur     Evaluated by cardiologist 4-5 years ago but does not currently see a cardiologist  . Gait disorder   . Cervical myelopathy     with myelomalacia at C5-6 and C3-4  . RLS (restless legs syndrome)   . Peripheral neuropathy   . 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 Constipation - functional     controlled with miralax  . RBBB   . 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 CHF (congestive heart failure)   . Pneumonia   . Restless legs syndrome 03/14/2015    Past Surgical History  Procedure Laterality Date  . Cholecystectomy    . Back surgery      6 surgeries  . Lumbar laminectomy/decompression microdiscectomy  07/14/2012    Procedure: LUMBAR LAMINECTOMY/DECOMPRESSION MICRODISCECTOMY 2  LEVELS;  Surgeon: Eustace Moore, MD;  Location: [place] [facility];  Service: Neurosurgery;  Laterality: Left;  Lumbar two three laminectomy, left thoracic four five transpedicular diskectomy    There were no vitals filed for this visit.  Visit Diagnosis:  Difficulty in walking  Weakness of both legs  Abnormality of gait     05/04/15 1522  Symptoms/Limitations  Subjective No new complaints. No falls or pain to report. Does have "burning" from knees down, mostly in calf  Pain Assessment  Currently in Pain? No/denies  Pain Score 0      05/04/15 1539  Transfers  Sit to Stand 5: Supervision;With upper extremity assist;From bed;From chair/3-in-1  Stand to Sit 5: Supervision;With upper extremity assist  Ambulation/Gait  Ambulation/Gait Yes  Ambulation/Gait Assistance 5: Supervision  Ambulation/Gait Assistance Details cues on posture, equal step length and for increased foot clearance with swing phase of gait.  Ambulation Distance (Feet) 70 Feet (X1, 50 x 1)  Assistive device Rolling walker  Gait Pattern Trunk flexed;Poor foot clearance - left;Poor foot clearance - right;Decreased step length - right;Decreased step length - left;Decreased hip/knee flexion - right;Decreased hip/knee flexion - left;Trendelenburg;Narrow base of support  Ambulation Surface Level;Indoor  Gait velocity 20.81 sec's= 1.58 ft/sec with RW (with walker)  Berg Balance Test  Sit to Stand 3  Standing Unsupported 4  Sitting with  Back Unsupported but Feet Supported on Floor or Stool 4  Stand to Sit 4  Transfers 3  Standing Unsupported with Eyes Closed 4  Standing Ubsupported with Feet Together 4  From Standing, Reach Forward with Outstretched Arm 4 (10 inches)  From Standing Position, Pick up Object from Floor 3  From Standing Position, Turn to Look Behind Over each Shoulder 4  Turn 360 Degrees 2 (> 10 sec's)  Standing Unsupported, Alternately Place Feet on Step/Stool 0  Standing Unsupported, One Foot in Front 2   Standing on One Leg 1  Total Score 42  Timed Up and Go Test  TUG Normal TUG  Normal TUG (seconds) 27.34 (22.10 on second trial)          PT Short Term Goals - 05/04/15 1537    PT SHORT TERM GOAL #1   Title Independent with home exercise program to address lower extremity and core weakness and balance impairment. Target: 05/04/15   Status Achieved   PT SHORT TERM GOAL #2   Title Increase Berg Balance Test score to 38/56. Target: 05/04/15   Baseline 05/04/15- 42/56   Status Achieved   PT SHORT TERM GOAL #3   Title Decrease TUG time to 22 seconds with LRAD. Target: 05/04/15   Baseline 05/04/15- 27.34 sec's with RW   Status Not Met   PT SHORT TERM GOAL #4   Title Increase gait speed with cane to 1.35 ft/sec for increased efficiency with ambulation.   Baseline 05/04/15- 1.58 ft/sec with walker   Status Achieved   PT SHORT TERM GOAL #5   Title Ambulate 300' with cane on indoor, level surfaces with supervision without scissoring while turning for improving independence with ambulation.   Baseline Extended to long term goal   Status On-going           PT Long Term Goals - 04/20/15 1720    PT LONG TERM GOAL #1   Title Increase Berg Balance Test score to >45/56 for decreased fall risk. Target 06/03/15   PT LONG TERM GOAL #2   Title Decrease TUG time to 19 seconds with LRAD without scissoring during turn. Target 06/03/15   PT LONG TERM GOAL #3   Title Increase gait speed with cane on level surfaces to >1.8 ft/sec for decreased fall risk. Target 06/03/15   PT LONG TERM GOAL #4   Title Ambulate 1000' with single point cane and MOD I for increased efficiency and independence with ambulation. Target 06/03/15   PT LONG TERM GOAL #5   Title Increase score on paper copy of Acitivities Balance Confidence test to 55% for improved balance confidence. Taret 06/03/15   PT LONG TERM GOAL #6   Title ------------------------------   PT LONG TERM GOAL #7   Title ------------------------------   PT LONG TERM  GOAL #8   Title -----------------------------------        05/04/15 1523  Plan  Clinical Impression Statement Pt has met some STG's and is progressing toward others. Stll with leg weakness and decreased gait stability with cane.   Pt will benefit from skilled therapeutic intervention in order to improve on the following deficits Abnormal gait;Decreased coordination;Difficulty walking;Impaired flexibility;Decreased endurance;Decreased balance;Decreased strength  Rehab Potential Good  PT Frequency 2x / week  PT Duration 8 weeks  PT Treatment/Interventions ADLs/Self Care Home Management;Therapeutic activities;Patient/family education;DME Instruction;Therapeutic exercise;Gait training;Balance training;Stair training;Neuromuscular re-education;Functional mobility training  PT Next Visit Plan gait with straight cane, hip flexor and abductor strength, hip adductor flexibility, single limb stance activities  toward LTG's  Consulted and Agree with Plan of Care Patient     Problem List Patient Active Problem List   Diagnosis Date Noted  . Restless legs syndrome 03/14/2015  . DOE (dyspnea on exertion) 03/09/2015  . Sinus bradycardia 09/21/2014  . Thoracic spondylosis with myelopathy 09/13/2014  . Acute diastolic heart failure 67/28/9791  . Cervical arthritis with myelopathy 07/18/2014  . Constipation - functional   . CAP (community acquired pneumonia) 07/10/2014  . Sepsis 07/10/2014  . HTN (hypertension) 07/10/2014  . OSA on CPAP 07/10/2014  . UTI (lower urinary tract infection) 07/10/2014  . Edema leg 07/10/2014  . Acute respiratory failure with hypoxia 07/10/2014  . 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 Other vitamin B12 deficiency anemia 07/13/2013  . 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 Polyneuropathy in other diseases classified elsewhere 02/17/2013  . Other general symptoms(780.99) 02/17/2013  . Abnormality of gait 02/17/2013  . Cervical spondylosis  with myelopathy 02/17/2013    Willow Ora 05/07/2015, 1:09 AM  Willow Ora, PTA, [facility] [address] [place], [place] [address] [phone] 05/07/2015, 1:09 AM

## 2015-05-09 ENCOUNTER — Ambulatory Visit: Payer: Medicare Other | Admitting: Physical Therapy

## 2015-05-09 ENCOUNTER — Encounter: Payer: Self-pay | Admitting: Physical Therapy

## 2015-05-09 DIAGNOSIS — R262 Difficulty in walking, not elsewhere classified: Secondary | ICD-10-CM

## 2015-05-09 DIAGNOSIS — Z5189 Encounter for other specified aftercare: Secondary | ICD-10-CM | POA: Diagnosis not present

## 2015-05-09 DIAGNOSIS — R269 Unspecified abnormalities of gait and mobility: Secondary | ICD-10-CM

## 2015-05-09 DIAGNOSIS — R29898 Other symptoms and signs involving the musculoskeletal system: Secondary | ICD-10-CM

## 2015-05-09 NOTE — Patient Instructions (Signed)
Hamstring Strengthening   Holding support, lift right heel toward buttocks. Use _2___ lbs on ankle. Hold 3___ seconds. Repeat 10___ times each leg.  Do __1-2_ sessions per day.  http://gt2.exer.us/281   Copyright  VHI. All rights reserved.

## 2015-05-09 NOTE — Therapy (Signed)
[facility] [address] [place], [place], [address] Phone: [phone]   Fax:  [phone]  Physical Therapy Treatment  Patient Details  Name: Travis [name] MRN: [id number] Date of Birth: May 10, 1946 Referring Provider:  [name], MD  Encounter Date: 05/09/2015      PT End of Session - 05/09/15 1455    Visit Number 9   Number of Visits 17   Date for PT Re-Evaluation 06/03/15   Authorization Type Medicare-needs G codes every 10th visit   PT Start Time 1450   PT Stop Time 1530   PT Time Calculation (min) 40 min   Equipment Utilized During Treatment Gait belt   Activity Tolerance Patient tolerated treatment well   Behavior During Therapy [facility] for tasks assessed/performed      Past Medical History  Diagnosis Date  . PONV (postoperative nausea and vomiting)   . Hypertension   . DJD (degenerative joint disease)     WHOLE SPINE  . Hyperthyroidism     Had Iodine to resolve issue DX 10 YR AGO  . Hypothyroidism     Low d/t treatment of hyperthyroidism  . Sleep apnea     DX  2009/2010 study done at [facility]  . Type II diabetes mellitus     diet controlled  . Heart murmur     Evaluated by cardiologist 4-5 years ago but does not currently see a cardiologist  . Gait disorder   . Cervical myelopathy     with myelomalacia at C5-6 and C3-4  . RLS (restless legs syndrome)   . Peripheral neuropathy   . 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 Constipation - functional     controlled with miralax  . RBBB   . 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 CHF (congestive heart failure)   . Pneumonia   . Restless legs syndrome 03/14/2015    Past Surgical History  Procedure Laterality Date  . Cholecystectomy    . Back surgery      6 surgeries  . Lumbar laminectomy/decompression microdiscectomy  07/14/2012    Procedure: LUMBAR LAMINECTOMY/DECOMPRESSION MICRODISCECTOMY 2 LEVELS;  Surgeon: Eustace Moore, MD;  Location:  [facility];  Service: Neurosurgery;  Laterality: Left;  Lumbar two three laminectomy, left thoracic four five transpedicular diskectomy    There were no vitals filed for this visit.  Visit Diagnosis:  Difficulty in walking  Weakness of both legs  Abnormality of gait      Subjective Assessment - 05/09/15 1453    Subjective No new complaints. No falls or pain to report. Burning has decreased some, still getting most of the burning feeling from the straight leg raises exercise. Reports it resolves overnight and is gone when he wakes up (does the exercise at night)   Currently in Pain? No/denies   Pain Score 0-No pain           [facility] Adult PT Treatment/Exercise - 05/09/15 1456    Transfers   Sit to Stand 5: Supervision;With upper extremity assist;From bed;From chair/3-in-1   Stand to Sit 5: Supervision;With upper extremity assist   Ambulation/Gait   Ambulation/Gait Yes   Ambulation/Gait Assistance 4: Min guard;4: Min assist   Ambulation/Gait Assistance Details cues on posture, step length and for foot clearance with swing phase  Ambulation Distance (Feet) 115 Feet   Assistive device Straight cane   Gait Pattern Trunk flexed;Poor foot clearance - left;Poor foot clearance - right;Decreased step length - right;Decreased step length - left;Decreased hip/knee flexion - right;Decreased hip/knee flexion - left;Trendelenburg;Narrow base of support   Ambulation Surface Level;Indoor      Treatment: Exercise: cues on correct exercise form/technique needed Seated on elevated mat table sit/stands with OH press with 2# yellow ball x 10 reps Long arc quads with 2# ankle weights 5 sec hold x 10 reps each leg  Standing with light UE support on chair back and bil 2# ankle weights - heel raises 5 sec hold x 10 reps - toe raises 5 sec hold x 10 reps - alternating hamstring curls x 10 each leg           PT Education - 05/09/15 1527    Education  provided Yes          PT Short Term Goals - 05/04/15 1537    PT SHORT TERM GOAL #1   Title Independent with home exercise program to address lower extremity and core weakness and balance impairment. Target: 05/04/15   Status Achieved   PT SHORT TERM GOAL #2   Title Increase Berg Balance Test score to 38/56. Target: 05/04/15   Baseline 05/04/15- 42/56   Status Achieved   PT SHORT TERM GOAL #3   Title Decrease TUG time to 22 seconds with LRAD. Target: 05/04/15   Baseline 05/04/15- 27.34 sec's with RW   Status Not Met   PT SHORT TERM GOAL #4   Title Increase gait speed with cane to 1.35 ft/sec for increased efficiency with ambulation.   Baseline 05/04/15- 1.58 ft/sec with walker   Status Achieved   PT SHORT TERM GOAL #5   Title Ambulate 300' with cane on indoor, level surfaces with supervision without scissoring while turning for improving independence with ambulation.   Baseline Extended to long term goal   Status On-going           PT Long Term Goals - 04/20/15 1720    PT LONG TERM GOAL #1   Title Increase Berg Balance Test score to >45/56 for decreased fall risk. Target 06/03/15   PT LONG TERM GOAL #2   Title Decrease TUG time to 19 seconds with LRAD without scissoring during turn. Target 06/03/15   PT LONG TERM GOAL #3   Title Increase gait speed with cane on level surfaces to >1.8 ft/sec for decreased fall risk. Target 06/03/15   PT LONG TERM GOAL #4   Title Ambulate 1000' with single point cane and MOD I for increased efficiency and independence with ambulation. Target 06/03/15   PT LONG TERM GOAL #5   Title Increase score on paper copy of Acitivities Balance Confidence test to 55% for improved balance confidence. Taret 06/03/15   PT LONG TERM GOAL #6   Title ------------------------------   PT LONG TERM GOAL #7   Title ------------------------------   PT LONG TERM GOAL #8   Title -----------------------------------            Plan - 05/09/15 1455    Clinical Impression  Statement Pt continues to fatigue quickly with using a cane with gait. No issues reported with exercises performed today. Pt making progress toward goals.   Pt will benefit from skilled therapeutic intervention in order to improve on the following deficits Abnormal gait;Decreased coordination;Difficulty walking;Impaired flexibility;Decreased endurance;Decreased balance;Decreased strength   Rehab Potential Good   PT Frequency  2x / week   PT Duration 8 weeks   PT Treatment/Interventions ADLs/Self Care Home Management;Therapeutic activities;Patient/family education;DME Instruction;Therapeutic exercise;Gait training;Balance training;Stair training;Neuromuscular re-education;Functional mobility training   PT Next Visit Plan gait with straight cane, hip flexor and abductor strength, hip adductor flexibility, single limb stance activities toward LTG's   Consulted and Agree with Plan of Care Patient        Problem List Patient Active Problem List   Diagnosis Date Noted  . Restless legs syndrome 03/14/2015  . DOE (dyspnea on exertion) 03/09/2015  . Sinus bradycardia 09/21/2014  . Thoracic spondylosis with myelopathy 09/13/2014  . Acute diastolic heart failure 17/40/9927  . Cervical arthritis with myelopathy 07/18/2014  . Constipation - functional   . CAP (community acquired pneumonia) 07/10/2014  . Sepsis 07/10/2014  . HTN (hypertension) 07/10/2014  . OSA on CPAP 07/10/2014  . UTI (lower urinary tract infection) 07/10/2014  . Edema leg 07/10/2014  . Acute respiratory failure with hypoxia 07/10/2014  . 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 Other vitamin B12 deficiency anemia 07/13/2013  . 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 Polyneuropathy in other diseases classified elsewhere 02/17/2013  . Other general symptoms(780.99) 02/17/2013  . Abnormality of gait 02/17/2013  . Cervical spondylosis with myelopathy 02/17/2013    Willow Ora 05/09/2015, 8:43  PM  Willow Ora, PTA, [facility] [address] [place], [place] [address] [phone] 05/09/2015, 8:43 PM

## 2015-05-11 ENCOUNTER — Encounter: Payer: Self-pay | Admitting: Physical Therapy

## 2015-05-11 ENCOUNTER — Ambulatory Visit: Payer: Medicare Other | Admitting: Physical Therapy

## 2015-05-11 DIAGNOSIS — R262 Difficulty in walking, not elsewhere classified: Secondary | ICD-10-CM

## 2015-05-11 DIAGNOSIS — R29898 Other symptoms and signs involving the musculoskeletal system: Secondary | ICD-10-CM

## 2015-05-11 DIAGNOSIS — Z5189 Encounter for other specified aftercare: Secondary | ICD-10-CM | POA: Diagnosis not present

## 2015-05-11 NOTE — Therapy (Addendum)
[facility] [address] [place], [place], [address] Phone: [phone]   Fax:  [phone]  Physical Therapy Treatment  Patient Details  Name: Travis [name] MRN: [id number] Date of Birth: Jul 17, 1946 Referring Provider:  [name], MD  Encounter Date: 05/11/2015    Past Medical History  Diagnosis Date  . PONV (postoperative nausea and vomiting)   . Hypertension   . DJD (degenerative joint disease)     WHOLE SPINE  . Hyperthyroidism     Had Iodine to resolve issue DX 10 YR AGO  . Hypothyroidism     Low d/t treatment of hyperthyroidism  . Sleep apnea     DX  2009/2010 study done at [facility]  . Type II diabetes mellitus     diet controlled  . Heart murmur     Evaluated by cardiologist 4-5 years ago but does not currently see a cardiologist  . Gait disorder   . Cervical myelopathy     with myelomalacia at C5-6 and C3-4  . RLS (restless legs syndrome)   . Peripheral neuropathy   . 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 Constipation - functional     controlled with miralax  . RBBB   . 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 CHF (congestive heart failure)   . Pneumonia   . Restless legs syndrome 03/14/2015    Past Surgical History  Procedure Laterality Date  . Cholecystectomy    . Back surgery      6 surgeries  . Lumbar laminectomy/decompression microdiscectomy  07/14/2012    Procedure: LUMBAR LAMINECTOMY/DECOMPRESSION MICRODISCECTOMY 2 LEVELS;  Surgeon: Eustace Moore, MD;  Location: [facility];  Service: Neurosurgery;  Laterality: Left;  Lumbar two three laminectomy, left thoracic four five transpedicular diskectomy    There were no vitals filed for this visit.  Visit Diagnosis:  Difficulty in walking  Weakness of both legs            PT Short Term Goals - 05/04/15 1537    PT SHORT TERM GOAL #1   Title Independent with home exercise program to address lower extremity and core  weakness and balance impairment. Target: 05/04/15   Status Achieved   PT SHORT TERM GOAL #2   Title Increase Berg Balance Test score to 38/56. Target: 05/04/15   Baseline 05/04/15- 42/56   Status Achieved   PT SHORT TERM GOAL #3   Title Decrease TUG time to 22 seconds with LRAD. Target: 05/04/15   Baseline 05/04/15- 27.34 sec's with RW   Status Not Met   PT SHORT TERM GOAL #4   Title Increase gait speed with cane to 1.35 ft/sec for increased efficiency with ambulation.   Baseline 05/04/15- 1.58 ft/sec with walker   Status Achieved   PT SHORT TERM GOAL #5   Title Ambulate 300' with cane on indoor, level surfaces with supervision without scissoring while turning for improving independence with ambulation.   Baseline Extended to long term goal   Status On-going           PT Long Term Goals - 04/20/15 1720    PT LONG TERM GOAL #1   Title Increase Berg Balance Test score to >45/56 for decreased fall risk. Target 06/03/15   PT LONG TERM GOAL #2   Title Decrease TUG time to 19 seconds with LRAD without scissoring during turn. Target 06/03/15   PT LONG TERM GOAL #3   Title Increase gait speed with cane  on level surfaces to >1.8 ft/sec for decreased fall risk. Target 06/03/15   PT LONG TERM GOAL #4   Title Ambulate 1000' with single point cane and MOD I for increased efficiency and independence with ambulation. Target 06/03/15   PT LONG TERM GOAL #5   Title Increase score on paper copy of Acitivities Balance Confidence test to 55% for improved balance confidence. Taret 06/03/15   PT LONG TERM GOAL #6   Title ------------------------------   PT LONG TERM GOAL #7   Title ------------------------------   PT LONG TERM GOAL #8   Title -----------------------------------         Problem List Patient Active Problem List   Diagnosis Date Noted  . Restless legs syndrome 03/14/2015  . DOE (dyspnea on exertion) 03/09/2015  . Sinus bradycardia 09/21/2014  . Thoracic spondylosis with myelopathy 09/13/2014   . Acute diastolic heart failure 55/12/5866  . Cervical arthritis with myelopathy 07/18/2014  . Constipation - functional   . CAP (community acquired pneumonia) 07/10/2014  . Sepsis 07/10/2014  . HTN (hypertension) 07/10/2014  . OSA on CPAP 07/10/2014  . UTI (lower urinary tract infection) 07/10/2014  . Edema leg 07/10/2014  . Acute respiratory failure with hypoxia 07/10/2014  . 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 Other vitamin B12 deficiency anemia 07/13/2013  . 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 Polyneuropathy in other diseases classified elsewhere 02/17/2013  . Other general symptoms(780.99) 02/17/2013  . Abnormality of gait 02/17/2013  . Cervical spondylosis with myelopathy 02/17/2013    Delrae Sawyers D 05/18/2015, 8:09 AM  Willow Ora, PTA, [facility] [address], [place], [place] [address] [phone] 05/18/2015, 8:09 AM   I attest that I have completed the G code for this patient.  Delrae Sawyers, PT,DPT,NCS 05/18/2015 8:09 AM Phone [phone] FAX [phone]

## 2015-05-16 ENCOUNTER — Ambulatory Visit: Payer: Medicare Other

## 2015-05-16 ENCOUNTER — Encounter: Payer: Medicare Other | Admitting: Physical Medicine & Rehabilitation

## 2015-05-16 DIAGNOSIS — Z5189 Encounter for other specified aftercare: Secondary | ICD-10-CM | POA: Diagnosis not present

## 2015-05-16 DIAGNOSIS — R269 Unspecified abnormalities of gait and mobility: Secondary | ICD-10-CM

## 2015-05-16 DIAGNOSIS — R29898 Other symptoms and signs involving the musculoskeletal system: Secondary | ICD-10-CM

## 2015-05-16 DIAGNOSIS — R279 Unspecified lack of coordination: Secondary | ICD-10-CM

## 2015-05-16 DIAGNOSIS — R262 Difficulty in walking, not elsewhere classified: Secondary | ICD-10-CM

## 2015-05-16 NOTE — Patient Instructions (Addendum)
Calf stretch: Place the ball of both feet on a 2" stool, heels stay on the ground, and hold onto a counter or chair for support. Perform 3x30 seconds in morning and at night.

## 2015-05-16 NOTE — Therapy (Signed)
[facility] [address] [place], [place], [address] Phone: [phone]   Fax:  [phone]  Physical Therapy Treatment  Patient Details  Name: Travis [name] MRN: [id number] Date of Birth: [birth year] Referring Provider:  [name], MD  Encounter Date: 05/16/2015      PT End of Session - 05/16/15 1623    Visit Number 11   Number of Visits 17   Date for PT Re-Evaluation 06/03/15   Authorization Type Medicare-needs G codes every 10th visit   PT Start Time 1536   PT Stop Time 1615   PT Time Calculation (min) 39 min   Equipment Utilized During Treatment Gait belt      Past Medical History  Diagnosis Date  . PONV (postoperative nausea and vomiting)   . Hypertension   . DJD (degenerative joint disease)     WHOLE SPINE  . Hyperthyroidism     Had Iodine to resolve issue DX 10 YR AGO  . Hypothyroidism     Low d/t treatment of hyperthyroidism  . Sleep apnea     DX  2009/2010 study done at [facility]  . Type II diabetes mellitus     diet controlled  . Heart murmur     Evaluated by cardiologist 4-5 years ago but does not currently see a cardiologist  . Gait disorder   . Cervical myelopathy     with myelomalacia at C5-6 and C3-4  . RLS (restless legs syndrome)   . Peripheral neuropathy   . 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 Constipation - functional     controlled with miralax  . RBBB   . 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 CHF (congestive heart failure)   . Pneumonia   . Restless legs syndrome 03/14/2015    Past Surgical History  Procedure Laterality Date  . Cholecystectomy    . Back surgery      6 surgeries  . Lumbar laminectomy/decompression microdiscectomy  07/14/2012    Procedure: LUMBAR LAMINECTOMY/DECOMPRESSION MICRODISCECTOMY 2 LEVELS;  Surgeon: Eustace Moore, MD;  Location: [facility];  Service: Neurosurgery;  Laterality: Left;  Lumbar two three laminectomy, left thoracic four five  transpedicular diskectomy    There were no vitals filed for this visit.  Visit Diagnosis:  Difficulty in walking  Weakness of both legs  Abnormality of gait  Lack of coordination      Subjective Assessment - 05/16/15 1537    Subjective No falls or pain to report.    Currently in Pain? No/denies     3x30 seconds hip abduction stretch passive BLE Then 2 minute 30 second prolonged hip abduction stretch with red physioball Then 2x10 clamshells each side with assist to keep pelvis forward  Gait training x20' then x121' with single point cane and CGA progressing to supervision with decreased foot clearance on BLE,   Standing at counter, marching in place to promote increased foot clearance with ambulation, then marching with heel taps to further improve foot clearance with increased dorsiflexion.  Standing calf stretch on 2" step stool 3x30-60 seconds with UE support.  Pt asked if he would be safe to ambulate in the community with a rollator. Gait training with rollator and supervisoin x 75' with 1 180 degree turn. Therapist feels pt is safe with this with supervision.   Gait training again with cane x75' with CGA and verbal cues for improved foot clearance.  PT Education - 05/16/15 1623    Education provided Yes   Education Details HEP calf stretch with 2" stool   Person(s) Educated Patient   Methods Explanation;Demonstration;Handout   Comprehension Verbalized understanding;Returned demonstration          PT Short Term Goals - 05/04/15 1537    PT SHORT TERM GOAL #1   Title Independent with home exercise program to address lower extremity and core weakness and balance impairment. Target: 05/04/15   Status Achieved   PT SHORT TERM GOAL #2   Title Increase Berg Balance Test score to 38/56. Target: 05/04/15   Baseline 05/04/15- 42/56   Status Achieved   PT SHORT TERM GOAL #3   Title Decrease TUG time to 22 seconds with LRAD.  Target: 05/04/15   Baseline 05/04/15- 27.34 sec's with RW   Status Not Met   PT SHORT TERM GOAL #4   Title Increase gait speed with cane to 1.35 ft/sec for increased efficiency with ambulation.   Baseline 05/04/15- 1.58 ft/sec with walker   Status Achieved   PT SHORT TERM GOAL #5   Title Ambulate 300' with cane on indoor, level surfaces with supervision without scissoring while turning for improving independence with ambulation.   Baseline Extended to long term goal   Status On-going           PT Long Term Goals - 04/20/15 1720    PT LONG TERM GOAL #1   Title Increase Berg Balance Test score to >45/56 for decreased fall risk. Target 06/03/15   PT LONG TERM GOAL #2   Title Decrease TUG time to 19 seconds with LRAD without scissoring during turn. Target 06/03/15   PT LONG TERM GOAL #3   Title Increase gait speed with cane on level surfaces to >1.8 ft/sec for decreased fall risk. Target 06/03/15   PT LONG TERM GOAL #4   Title Ambulate 1000' with single point cane and MOD I for increased efficiency and independence with ambulation. Target 06/03/15   PT LONG TERM GOAL #5   Title Increase score on paper copy of Acitivities Balance Confidence test to 55% for improved balance confidence. Taret 06/03/15   PT LONG TERM GOAL #6   Title ------------------------------   PT LONG TERM GOAL #7   Title ------------------------------   PT LONG TERM GOAL #8   Title -----------------------------------               Plan - 05/16/15 1624    Clinical Impression Statement Pt demonstrated improved stability with ambulating with single point cane today. Demonstrated improved foot clearance following calf stretch and verbal cues to clear feet when walkiong. Continue per plan of care.   PT Next Visit Plan ask how calf stretch on 2" stool is going at home. Single limb stance activities and gait training with single point cane        Problem List Patient Active Problem List   Diagnosis Date Noted  .  Restless legs syndrome 03/14/2015  . DOE (dyspnea on exertion) 03/09/2015  . Sinus bradycardia 09/21/2014  . Thoracic spondylosis with myelopathy 09/13/2014  . Acute diastolic heart failure 52/84/1324  . Cervical arthritis with myelopathy 07/18/2014  . Constipation - functional   . CAP (community acquired pneumonia) 07/10/2014  . Sepsis 07/10/2014  . HTN (hypertension) 07/10/2014  . OSA on CPAP 07/10/2014  . UTI (lower urinary tract infection) 07/10/2014  . Edema leg 07/10/2014  . Acute respiratory failure with hypoxia 07/10/2014  . 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  Other vitamin B12 deficiency anemia 07/13/2013  . 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 Polyneuropathy in other diseases classified elsewhere 02/17/2013  . Other general symptoms(780.99) 02/17/2013  . Abnormality of gait 02/17/2013  . Cervical spondylosis with myelopathy 02/17/2013   Delrae Sawyers, PT,DPT,NCS 05/16/2015 4:31 PM Phone [phone] FAX [phone]         [facility] [address] [place], [place], [address] Phone: [phone]   Fax:  [phone]

## 2015-05-18 ENCOUNTER — Ambulatory Visit: Payer: Medicare Other

## 2015-05-18 DIAGNOSIS — R279 Unspecified lack of coordination: Secondary | ICD-10-CM

## 2015-05-18 DIAGNOSIS — R29898 Other symptoms and signs involving the musculoskeletal system: Secondary | ICD-10-CM

## 2015-05-18 DIAGNOSIS — Z5189 Encounter for other specified aftercare: Secondary | ICD-10-CM | POA: Diagnosis not present

## 2015-05-18 DIAGNOSIS — R269 Unspecified abnormalities of gait and mobility: Secondary | ICD-10-CM

## 2015-05-18 DIAGNOSIS — R262 Difficulty in walking, not elsewhere classified: Secondary | ICD-10-CM

## 2015-05-18 NOTE — Therapy (Signed)
[facility] [address] [place], [place], [address] Phone: [phone]   Fax:  [phone]  Physical Therapy Treatment  Patient Details  Name: Travis [name] MRN: [id number] Date of Birth: Feb 20, 1946 Referring Provider:  [name], MD  Encounter Date: 05/18/2015      PT End of Session - 05/18/15 1619    Visit Number 12   Number of Visits 17   Date for PT Re-Evaluation 06/03/15   Authorization Type Medicare-needs G codes every 10th visit   PT Start Time 1405   PT Stop Time 1445   PT Time Calculation (min) 40 min      Past Medical History  Diagnosis Date  . PONV (postoperative nausea and vomiting)   . Hypertension   . DJD (degenerative joint disease)     WHOLE SPINE  . Hyperthyroidism     Had Iodine to resolve issue DX 10 YR AGO  . Hypothyroidism     Low d/t treatment of hyperthyroidism  . Sleep apnea     DX  2009/2010 study done at [facility]  . Type II diabetes mellitus     diet controlled  . Heart murmur     Evaluated by cardiologist 4-5 years ago but does not currently see a cardiologist  . Gait disorder   . Cervical myelopathy     with myelomalacia at C5-6 and C3-4  . RLS (restless legs syndrome)   . Peripheral neuropathy   . 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 Constipation - functional     controlled with miralax  . RBBB   . 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 CHF (congestive heart failure)   . Pneumonia   . Restless legs syndrome 03/14/2015    Past Surgical History  Procedure Laterality Date  . Cholecystectomy    . Back surgery      6 surgeries  . Lumbar laminectomy/decompression microdiscectomy  07/14/2012    Procedure: LUMBAR LAMINECTOMY/DECOMPRESSION MICRODISCECTOMY 2 LEVELS;  Surgeon: Eustace Moore, MD;  Location: [facility];  Service: Neurosurgery;  Laterality: Left;  Lumbar two three laminectomy, left thoracic four five transpedicular diskectomy    There were no  vitals filed for this visit.  Visit Diagnosis:  Difficulty in walking  Weakness of both legs  Abnormality of gait  Lack of coordination      Subjective Assessment - 05/18/15 1536    Subjective Been practicing marching like we did last session, and feels that it is helping with lifing the legs for ambulation.    Currently in Pain? No/denies      Therex: 2 minutes supine hip flexor stretch each leg Supine hamstring curls in hip flexor stretch position 3x10 each leg Straight leg raises 2x10 Bilateral knee to chest stretch 3x30 seconds Lumbar rocking 3x each side ~30 seconds each  Single limb stance activities: <5 minutes alternate foot taps on 4" step stool with single UE support on single point cane.    Gait training with single point cane: 2x118' with CGA with noted RLE hamstring spasticity, empahsis on foot clearance with increased hip flexion. Pt noted to have decreased scissoring with ambulation today (hips continue to adduct but do not cross).                           PT Short Term Goals - 05/04/15 1537    PT SHORT TERM GOAL #1   Title Independent with home  exercise program to address lower extremity and core weakness and balance impairment. Target: 05/04/15   Status Achieved   PT SHORT TERM GOAL #2   Title Increase Berg Balance Test score to 38/56. Target: 05/04/15   Baseline 05/04/15- 42/56   Status Achieved   PT SHORT TERM GOAL #3   Title Decrease TUG time to 22 seconds with LRAD. Target: 05/04/15   Baseline 05/04/15- 27.34 sec's with RW   Status Not Met   PT SHORT TERM GOAL #4   Title Increase gait speed with cane to 1.35 ft/sec for increased efficiency with ambulation.   Baseline 05/04/15- 1.58 ft/sec with walker   Status Achieved   PT SHORT TERM GOAL #5   Title Ambulate 300' with cane on indoor, level surfaces with supervision without scissoring while turning for improving independence with ambulation.   Baseline Extended to long term goal    Status On-going           PT Long Term Goals - 04/20/15 1720    PT LONG TERM GOAL #1   Title Increase Berg Balance Test score to >45/56 for decreased fall risk. Target 06/03/15   PT LONG TERM GOAL #2   Title Decrease TUG time to 19 seconds with LRAD without scissoring during turn. Target 06/03/15   PT LONG TERM GOAL #3   Title Increase gait speed with cane on level surfaces to >1.8 ft/sec for decreased fall risk. Target 06/03/15   PT LONG TERM GOAL #4   Title Ambulate 1000' with single point cane and MOD I for increased efficiency and independence with ambulation. Target 06/03/15   PT LONG TERM GOAL #5   Title Increase score on paper copy of Acitivities Balance Confidence test to 55% for improved balance confidence. Taret 06/03/15   PT LONG TERM GOAL #6   Title ------------------------------   PT LONG TERM GOAL #7   Title ------------------------------   PT LONG TERM GOAL #8   Title -----------------------------------               Plan - 05/18/15 1621    Clinical Impression Statement Pt continues to make progress with good stability with single point cane, decreased scissoring, improving foot clearance when intentionally flexing hips, appears to have improved proprioception as ataxia has significantly decreased and pt has improved foot placement while ambulating and performing foot tap activity.   PT Next Visit Plan Single limb stance activities and gait training with cane        Problem List Patient Active Problem List   Diagnosis Date Noted  . Restless legs syndrome 03/14/2015  . DOE (dyspnea on exertion) 03/09/2015  . Sinus bradycardia 09/21/2014  . Thoracic spondylosis with myelopathy 09/13/2014  . Acute diastolic heart failure 88/50/2774  . Cervical arthritis with myelopathy 07/18/2014  . Constipation - functional   . CAP (community acquired pneumonia) 07/10/2014  . Sepsis 07/10/2014  . HTN (hypertension) 07/10/2014  . OSA on CPAP 07/10/2014  . UTI (lower urinary  tract infection) 07/10/2014  . Edema leg 07/10/2014  . Acute respiratory failure with hypoxia 07/10/2014  . 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 Other vitamin B12 deficiency anemia 07/13/2013  . 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 Polyneuropathy in other diseases classified elsewhere 02/17/2013  . Other general symptoms(780.99) 02/17/2013  . Abnormality of gait 02/17/2013  . Cervical spondylosis with myelopathy 02/17/2013   Delrae Sawyers, PT,DPT,NCS 05/18/2015 4:23 PM Phone [phone] FAX [phone]         [facility] [address] [place], [place], [address] Phone: [phone]   Fax:  [phone]

## 2015-05-23 ENCOUNTER — Ambulatory Visit: Payer: Medicare Other

## 2015-05-23 DIAGNOSIS — Z5189 Encounter for other specified aftercare: Secondary | ICD-10-CM | POA: Diagnosis not present

## 2015-05-23 DIAGNOSIS — R279 Unspecified lack of coordination: Secondary | ICD-10-CM

## 2015-05-23 DIAGNOSIS — R29898 Other symptoms and signs involving the musculoskeletal system: Secondary | ICD-10-CM

## 2015-05-23 NOTE — Therapy (Signed)
[facility] [address] [place], [place], [address] Phone: [phone]   Fax:  [phone]  Physical Therapy Treatment  Patient Details  Name: Travis [name] MRN: [id number] Date of Birth: Mar 03, 1946 Referring Provider:  [name], MD  Encounter Date: 05/23/2015      PT End of Session - 05/23/15 1632    Visit Number 13   Number of Visits 17   Date for PT Re-Evaluation 06/03/15   Authorization Type Medicare-needs G codes every 10th visit   PT Start Time 1535   PT Stop Time 1615   PT Time Calculation (min) 40 min      Past Medical History  Diagnosis Date  . PONV (postoperative nausea and vomiting)   . Hypertension   . DJD (degenerative joint disease)     WHOLE SPINE  . Hyperthyroidism     Had Iodine to resolve issue DX 10 YR AGO  . Hypothyroidism     Low d/t treatment of hyperthyroidism  . Sleep apnea     DX  2009/2010 study done at [facility]  . Type II diabetes mellitus     diet controlled  . Heart murmur     Evaluated by cardiologist 4-5 years ago but does not currently see a cardiologist  . Gait disorder   . Cervical myelopathy     with myelomalacia at C5-6 and C3-4  . RLS (restless legs syndrome)   . Peripheral neuropathy   . 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 Constipation - functional     controlled with miralax  . RBBB   . 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 CHF (congestive heart failure)   . Pneumonia   . Restless legs syndrome 03/14/2015    Past Surgical History  Procedure Laterality Date  . Cholecystectomy    . Back surgery      6 surgeries  . Lumbar laminectomy/decompression microdiscectomy  07/14/2012    Procedure: LUMBAR LAMINECTOMY/DECOMPRESSION MICRODISCECTOMY 2 LEVELS;  Surgeon: Eustace Moore, MD;  Location: [facility];  Service: Neurosurgery;  Laterality: Left;  Lumbar two three laminectomy, left thoracic four five transpedicular diskectomy    There were no  vitals filed for this visit.  Visit Diagnosis:  Lack of coordination  Weakness of both legs      Subjective Assessment - 05/23/15 1540    Subjective pt reports he feels sore and achy today.   Currently in Pain? --  Pt was having soreness but this was relieved with quads stretch        Quads/hip flexor stretch  In supine x2 minutes each leg. Pt reported relief of leg soreness. 3x30 seconds standing calf stretch on 2" stool with BUE support on RW. Pt reports he has been doing this at home as recommended. Did not perform it this AM.  Activities to increase stability and knee control in single limb stance: -Alternate foot taps on cones with single UE support on chair with CGA to steady -Cone tipping and flipping with single UE support on chair with CGA -standing with single LE on 6" stool, and single UE support on a chair, performed upper extremity clothespins task with emphasis on forward reach in single limb stance for improved balance with supervision. Performed on each leg.                           PT Short Term Goals - 05/04/15 1537  PT SHORT TERM GOAL #1   Title Independent with home exercise program to address lower extremity and core weakness and balance impairment. Target: 05/04/15   Status Achieved   PT SHORT TERM GOAL #2   Title Increase Berg Balance Test score to 38/56. Target: 05/04/15   Baseline 05/04/15- 42/56   Status Achieved   PT SHORT TERM GOAL #3   Title Decrease TUG time to 22 seconds with LRAD. Target: 05/04/15   Baseline 05/04/15- 27.34 sec's with RW   Status Not Met   PT SHORT TERM GOAL #4   Title Increase gait speed with cane to 1.35 ft/sec for increased efficiency with ambulation.   Baseline 05/04/15- 1.58 ft/sec with walker   Status Achieved   PT SHORT TERM GOAL #5   Title Ambulate 300' with cane on indoor, level surfaces with supervision without scissoring while turning for improving independence with ambulation.   Baseline Extended to  long term goal   Status On-going           PT Long Term Goals - 04/20/15 1720    PT LONG TERM GOAL #1   Title Increase Berg Balance Test score to >45/56 for decreased fall risk. Target 06/03/15   PT LONG TERM GOAL #2   Title Decrease TUG time to 19 seconds with LRAD without scissoring during turn. Target 06/03/15   PT LONG TERM GOAL #3   Title Increase gait speed with cane on level surfaces to >1.8 ft/sec for decreased fall risk. Target 06/03/15   PT LONG TERM GOAL #4   Title Ambulate 1000' with single point cane and MOD I for increased efficiency and independence with ambulation. Target 06/03/15   PT LONG TERM GOAL #5   Title Increase score on paper copy of Acitivities Balance Confidence test to 55% for improved balance confidence. Taret 06/03/15   PT LONG TERM GOAL #6   Title ------------------------------   PT LONG TERM GOAL #7   Title ------------------------------   PT LONG TERM GOAL #8   Title -----------------------------------               Plan - 05/23/15 1634    Clinical Impression Statement Pt performed challenging single limb stance activities today with pt fatigued by end of session. Continues to benefit from stretching at start of session.   PT Next Visit Plan Single limb stance activities and gait training with cane        Problem List Patient Active Problem List   Diagnosis Date Noted  . Restless legs syndrome 03/14/2015  . DOE (dyspnea on exertion) 03/09/2015  . Sinus bradycardia 09/21/2014  . Thoracic spondylosis with myelopathy 09/13/2014  . Acute diastolic heart failure 47/42/5956  . Cervical arthritis with myelopathy 07/18/2014  . Constipation - functional   . CAP (community acquired pneumonia) 07/10/2014  . Sepsis 07/10/2014  . HTN (hypertension) 07/10/2014  . OSA on CPAP 07/10/2014  . UTI (lower urinary tract infection) 07/10/2014  . Edema leg 07/10/2014  . Acute respiratory failure with hypoxia 07/10/2014  . Lumbosacral spinal stenosis  03/13/2014  . Other vitamin B12 deficiency anemia 07/13/2013  . 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 Polyneuropathy in other diseases classified elsewhere 02/17/2013  . Other general symptoms(780.99) 02/17/2013  . Abnormality of gait 02/17/2013  . Cervical spondylosis with myelopathy 02/17/2013   Delrae Sawyers, PT,DPT,NCS 05/23/2015 4:37 PM Phone [phone] FAX [phone]         [facility] [place]  [place], [place], [address] Phone: [phone]   Fax:  [phone]

## 2015-05-25 ENCOUNTER — Ambulatory Visit: Payer: Medicare Other | Admitting: Physical Therapy

## 2015-05-30 ENCOUNTER — Ambulatory Visit: Payer: Medicare Other | Attending: Neurology | Admitting: Physical Therapy

## 2015-05-30 ENCOUNTER — Encounter: Payer: Self-pay | Admitting: Physical Therapy

## 2015-05-30 DIAGNOSIS — R262 Difficulty in walking, not elsewhere classified: Secondary | ICD-10-CM | POA: Diagnosis present

## 2015-05-30 DIAGNOSIS — R29898 Other symptoms and signs involving the musculoskeletal system: Secondary | ICD-10-CM | POA: Diagnosis present

## 2015-05-30 DIAGNOSIS — R269 Unspecified abnormalities of gait and mobility: Secondary | ICD-10-CM | POA: Insufficient documentation

## 2015-05-30 DIAGNOSIS — R279 Unspecified lack of coordination: Secondary | ICD-10-CM | POA: Diagnosis not present

## 2015-05-30 NOTE — Therapy (Signed)
[facility] [address] [place], [place], [address] Phone: [phone]   Fax:  [phone]  Physical Therapy Treatment  Patient Details  Name: Travis [name] MRN: [id number] Date of Birth: [birth year] Referring Provider:  [name], MD  Encounter Date: 05/30/2015      PT End of Session - 05/30/15 1536    Visit Number 14   Number of Visits 17   Date for PT Re-Evaluation 06/03/15   Authorization Type Medicare-needs G codes every 10th visit   PT Start Time 1533   PT Stop Time 1615   PT Time Calculation (min) 42 min   Equipment Utilized During Treatment Gait belt   Activity Tolerance Patient tolerated treatment well   Behavior During Therapy [facility] for tasks assessed/performed      Past Medical History  Diagnosis Date  . PONV (postoperative nausea and vomiting)   . Hypertension   . DJD (degenerative joint disease)     WHOLE SPINE  . Hyperthyroidism     Had Iodine to resolve issue DX 10 YR AGO  . Hypothyroidism     Low d/t treatment of hyperthyroidism  . Sleep apnea     DX  2009/2010 study done at [facility]  . Type II diabetes mellitus     diet controlled  . Heart murmur     Evaluated by cardiologist 4-5 years ago but does not currently see a cardiologist  . Gait disorder   . Cervical myelopathy     with myelomalacia at C5-6 and C3-4  . RLS (restless legs syndrome)   . Peripheral neuropathy   . 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 Constipation - functional     controlled with miralax  . RBBB   . 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 CHF (congestive heart failure)   . Pneumonia   . Restless legs syndrome 03/14/2015    Past Surgical History  Procedure Laterality Date  . Cholecystectomy    . Back surgery      6 surgeries  . Lumbar laminectomy/decompression microdiscectomy  07/14/2012    Procedure: LUMBAR LAMINECTOMY/DECOMPRESSION MICRODISCECTOMY 2 LEVELS;  Surgeon: Eustace Moore, MD;  Location:  [facility];  Service: Neurosurgery;  Laterality: Left;  Lumbar two three laminectomy, left thoracic four five transpedicular diskectomy    There were no vitals filed for this visit.  Visit Diagnosis:  Lack of coordination  Weakness of both legs  Difficulty in walking      Subjective Assessment - 05/30/15 1534    Subjective No new complaints. No pain or falls to report. Did have some right hip pain after last session that lasted for 3-4 days. Reports it limited him and made his leg feel weak. Reports it seems to be better today.   Currently in Pain? No/denies   Pain Score 0-No pain     Treatment Exercises Bridge 5 sec hold x 10 reps, 2 sets Hip flexion off mat to stool and back onto mat (with knee flexion) 2 set of 10 each leg Lower trunk rotation left/right x 10 each way  Activities to increase stability and knee control in single limb stance: With tall cones: Alternating forward toe taps x 10 each side, cross toe taps x 10 reps each side and flip over/up x 10 each side  With foam bubbles: 3 in a row: taps to each without touch back x 10 each leg  Staggered stance with one foot forward/up on 6 inch box,  other on ground behind box: alternating UE raises x 10 each arm with each foot forward on the box.           PT Short Term Goals - 05/04/15 1537    PT SHORT TERM GOAL #1   Title Independent with home exercise program to address lower extremity and core weakness and balance impairment. Target: 05/04/15   Status Achieved   PT SHORT TERM GOAL #2   Title Increase Berg Balance Test score to 38/56. Target: 05/04/15   Baseline 05/04/15- 42/56   Status Achieved   PT SHORT TERM GOAL #3   Title Decrease TUG time to 22 seconds with LRAD. Target: 05/04/15   Baseline 05/04/15- 27.34 sec's with RW   Status Not Met   PT SHORT TERM GOAL #4   Title Increase gait speed with cane to 1.35 ft/sec for increased efficiency with ambulation.   Baseline 05/04/15- 1.58 ft/sec with walker   Status  Achieved   PT SHORT TERM GOAL #5   Title Ambulate 300' with cane on indoor, level surfaces with supervision without scissoring while turning for improving independence with ambulation.   Baseline Extended to long term goal   Status On-going           PT Long Term Goals - 04/20/15 1720    PT LONG TERM GOAL #1   Title Increase Berg Balance Test score to >45/56 for decreased fall risk. Target 06/03/15   PT LONG TERM GOAL #2   Title Decrease TUG time to 19 seconds with LRAD without scissoring during turn. Target 06/03/15   PT LONG TERM GOAL #3   Title Increase gait speed with cane on level surfaces to >1.8 ft/sec for decreased fall risk. Target 06/03/15   PT LONG TERM GOAL #4   Title Ambulate 1000' with single point cane and MOD I for increased efficiency and independence with ambulation. Target 06/03/15   PT LONG TERM GOAL #5   Title Increase score on paper copy of Acitivities Balance Confidence test to 55% for improved balance confidence. Taret 06/03/15   PT LONG TERM GOAL #6   Title ------------------------------   PT LONG TERM GOAL #7   Title ------------------------------   PT LONG TERM GOAL #8   Title -----------------------------------           Plan - 05/30/15 1536    Clinical Impression Statement Pt continues to make progress toward goals. Held hip flexor stretching today due to pain it resulted in after last session.   Pt will benefit from skilled therapeutic intervention in order to improve on the following deficits Abnormal gait;Decreased coordination;Difficulty walking;Impaired flexibility;Decreased endurance;Decreased balance;Decreased strength   Rehab Potential Good   PT Frequency 2x / week   PT Duration 8 weeks   PT Treatment/Interventions ADLs/Self Care Home Management;Therapeutic activities;Patient/family education;DME Instruction;Therapeutic exercise;Gait training;Balance training;Stair training;Neuromuscular re-education;Functional mobility training   PT Next Visit Plan  Single limb stance activities and gait training with cane   Consulted and Agree with Plan of Care Patient        Problem List Patient Active Problem List   Diagnosis Date Noted  . Restless legs syndrome 03/14/2015  . DOE (dyspnea on exertion) 03/09/2015  . Sinus bradycardia 09/21/2014  . Thoracic spondylosis with myelopathy 09/13/2014  . Acute diastolic heart failure 81/27/5170  . Cervical arthritis with myelopathy 07/18/2014  . Constipation - functional   . CAP (community acquired pneumonia) 07/10/2014  . Sepsis 07/10/2014  . HTN (hypertension) 07/10/2014  . OSA on CPAP 07/10/2014  .  UTI (lower urinary tract infection) 07/10/2014  . Edema leg 07/10/2014  . Acute respiratory failure with hypoxia 07/10/2014  . 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 Other vitamin B12 deficiency anemia 07/13/2013  . 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 Polyneuropathy in other diseases classified elsewhere 02/17/2013  . Other general symptoms(780.99) 02/17/2013  . Abnormality of gait 02/17/2013  . Cervical spondylosis with myelopathy 02/17/2013    Willow Ora 05/31/2015, 9:12 PM  Willow Ora, PTA, [facility] [address], [place], [place] [address] [phone] 05/31/2015, 9:12 PM

## 2015-06-01 ENCOUNTER — Ambulatory Visit: Payer: Medicare Other | Admitting: Physical Therapy

## 2015-06-01 ENCOUNTER — Encounter: Payer: Self-pay | Admitting: Physical Therapy

## 2015-06-01 DIAGNOSIS — R279 Unspecified lack of coordination: Secondary | ICD-10-CM | POA: Diagnosis not present

## 2015-06-01 DIAGNOSIS — R262 Difficulty in walking, not elsewhere classified: Secondary | ICD-10-CM

## 2015-06-01 DIAGNOSIS — R269 Unspecified abnormalities of gait and mobility: Secondary | ICD-10-CM

## 2015-06-01 DIAGNOSIS — R29898 Other symptoms and signs involving the musculoskeletal system: Secondary | ICD-10-CM

## 2015-06-01 NOTE — Therapy (Addendum)
[facility] [address] [place], [place], [address] Phone: [phone]   Fax:  [phone]  Physical Therapy Treatment  Patient Details  Name: Travis [name] MRN: [id number] Date of Birth: [birth year] Referring Provider:  [name], MD  Encounter Date: 06/01/2015      PT End of Session - 06/01/15 1540    Visit Number 15   Number of Visits 17   Date for PT Re-Evaluation 08/02/15   Authorization Type Medicare-needs G codes every 10th visit   PT Start Time 1540   PT Stop Time 1620   PT Time Calculation (min) 40 min   Equipment Utilized During Treatment Gait belt   Activity Tolerance Patient tolerated treatment well   Behavior During Therapy [facility] for tasks assessed/performed      Past Medical History  Diagnosis Date  . PONV (postoperative nausea and vomiting)   . Hypertension   . DJD (degenerative joint disease)     WHOLE SPINE  . Hyperthyroidism     Had Iodine to resolve issue DX 10 YR AGO  . Hypothyroidism     Low d/t treatment of hyperthyroidism  . Sleep apnea     DX  2009/2010 study done at [facility]  . Type II diabetes mellitus     diet controlled  . Heart murmur     Evaluated by cardiologist 4-5 years ago but does not currently see a cardiologist  . Gait disorder   . Cervical myelopathy     with myelomalacia at C5-6 and C3-4  . RLS (restless legs syndrome)   . Peripheral neuropathy   . 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 Constipation - functional     controlled with miralax  . RBBB   . 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 CHF (congestive heart failure)   . Pneumonia   . Restless legs syndrome 03/14/2015    Past Surgical History  Procedure Laterality Date  . Cholecystectomy    . Back surgery      6 surgeries  . Lumbar laminectomy/decompression microdiscectomy  07/14/2012    Procedure: LUMBAR LAMINECTOMY/DECOMPRESSION MICRODISCECTOMY 2 LEVELS;  Surgeon: Eustace Moore, MD;  Location:  [place] [facility];  Service: Neurosurgery;  Laterality: Left;  Lumbar two three laminectomy, left thoracic four five transpedicular diskectomy    There were no vitals filed for this visit.  Visit Diagnosis:  Lack of coordination - Plan: PT plan of care cert/re-cert  Difficulty in walking - Plan: PT plan of care cert/re-cert  Abnormality of gait - Plan: PT plan of care cert/re-cert  Weakness of both legs - Plan: PT plan of care cert/re-cert      Subjective Assessment - 06/01/15 1539    Subjective No new complaints. No pain or falls to report. "Wore out" after last session. Has some right hip pain, however not as bad as with time before.   Currently in Pain? No/denies           [facility] Adult PT Treatment/Exercise - 06/01/15 0001    Ambulation/Gait   Ambulation/Gait Yes   Ambulation/Gait Assistance 6: Modified independent (Device/Increase time)   Ambulation Distance (Feet) 100 Feet   Assistive device Rolling walker   Ambulation Surface Level;Indoor   Gait velocity 16.19 sec's= 2.02 ft/sec's with walker   Berg Balance Test   Sit to Stand Able to stand  independently using hands   Standing Unsupported Able to stand safely 2 minutes   Sitting with Back Unsupported but  Feet Supported on Floor or Stool Able to sit safely and securely 2 minutes   Stand to Sit Sits safely with minimal use of hands   Transfers Able to transfer safely, definite need of hands   Standing Unsupported with Eyes Closed Able to stand 10 seconds safely   Standing Ubsupported with Feet Together Able to place feet together independently and stand 1 minute safely   From Standing, Reach Forward with Outstretched Arm Can reach confidently >25 cm (10")   From Standing Position, Pick up Object from Floor Unable to pick up shoe, but reaches 2-5 cm (1-2") from shoe and balances independently   From Standing Position, Turn to Look Behind Over each Shoulder Looks behind from both sides and weight shifts well   Turn 360 Degrees  Able to turn 360 degrees safely one side only in 4 seconds or less   Standing Unsupported, Alternately Place Feet on Step/Stool Able to complete >2 steps/needs minimal assist   Standing Unsupported, One Foot in Front Able to plae foot ahead of the other independently and hold 30 seconds   Standing on One Leg Tries to lift leg/unable to hold 3 seconds but remains standing independently   Total Score 44   Timed Up and Go Test   TUG Normal TUG   Normal TUG (seconds) 22.47            PT Short Term Goals - 05/04/15 1537    PT SHORT TERM GOAL #1   Title Independent with home exercise program to address lower extremity and core weakness and balance impairment. Target: 05/04/15   Status Achieved   PT SHORT TERM GOAL #2   Title Increase Berg Balance Test score to 38/56. Target: 05/04/15   Baseline 05/04/15- 42/56   Status Achieved   PT SHORT TERM GOAL #3   Title Decrease TUG time to 22 seconds with LRAD. Target: 05/04/15   Baseline 05/04/15- 27.34 sec's with RW   Status Not Met   PT SHORT TERM GOAL #4   Title Increase gait speed with cane to 1.35 ft/sec for increased efficiency with ambulation.   Baseline 05/04/15- 1.58 ft/sec with walker   Status Achieved   PT SHORT TERM GOAL #5   Title Ambulate 300' with cane on indoor, level surfaces with supervision without scissoring while turning for improving independence with ambulation.   Baseline Extended to long term goal   Status On-going           PT Long Term Goals - 06/01/15 1543    PT LONG TERM GOAL #1   Title Increase Berg Balance Test score to >45/56 for decreased fall risk. Target 06/03/15   Baseline 06/01/15: 44/56 scored today. All unmet goals carried over to new POC starting 06/04/15.   Status Not Met   PT LONG TERM GOAL #2   Title Decrease TUG time to 19 seconds with LRAD without scissoring during turn. Target 06/03/15   Baseline 06/01/15: 22.47 sec's with walker   PT LONG TERM GOAL #3   Title Increase gait speed with cane on level surfaces  to >1.8 ft/sec for decreased fall risk. Target 06/03/15   Baseline 06/01/15: 2.02 ft/sec with walker, not tested with cane   PT LONG TERM GOAL #4   Title Ambulate 1000' with single point cane and MOD I for increased efficiency and independence with ambulation. Target 06/03/15   Baseline 06/01/15: pt walking some at home with cane inside only, where he can furniture/wall walk as well. Not up to  this distance as yet.   Status Not Met   PT LONG TERM GOAL #5   Title Increase score on paper copy of [employer] Balance Confidence test to 55% for improved balance confidence. [place] 06/03/15   Baseline 06/01/15: scored 53.75% today.   Status Not Met   PT LONG TERM GOAL #6   Title ------------------------------   PT LONG TERM GOAL #7   Title ------------------------------   PT LONG TERM GOAL #8   Title -----------------------------------           Plan - 06/01/15 1540    Clinical Impression Statement Pt has made progress toward long term goals with some met or partially met. Continues to have lower extremity weakness and decreased balance per testing today. Primary PT plans to renew POC.   Pt will benefit from skilled therapeutic intervention in order to improve on the following deficits Abnormal gait;Decreased coordination;Difficulty walking;Impaired flexibility;Decreased endurance;Decreased balance;Decreased strength   Rehab Potential Good   PT Frequency 2x / week   PT Duration 8 weeks  PT requesting additional 8 weeks (for total of 16 weeks)   PT Treatment/Interventions ADLs/Self Care Home Management;Therapeutic activities;Patient/family education;DME Instruction;Therapeutic exercise;Gait training;Balance training;Stair training;Neuromuscular re-education;Functional mobility training   PT Next Visit Plan Single limb stance activities and gait training with cane   Consulted and Agree with Plan of Care Patient      Problem List Patient Active Problem List   Diagnosis Date Noted  . Restless legs  syndrome 03/14/2015  . DOE (dyspnea on exertion) 03/09/2015  . Sinus bradycardia 09/21/2014  . Thoracic spondylosis with myelopathy 09/13/2014  . Acute diastolic heart failure 33/43/5686  . Cervical arthritis with myelopathy 07/18/2014  . Constipation - functional   . CAP (community acquired pneumonia) 07/10/2014  . Sepsis 07/10/2014  . HTN (hypertension) 07/10/2014  . OSA on CPAP 07/10/2014  . UTI (lower urinary tract infection) 07/10/2014  . Edema leg 07/10/2014  . Acute respiratory failure with hypoxia 07/10/2014  . 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 Other vitamin B12 deficiency anemia 07/13/2013  . 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 Polyneuropathy in other diseases classified elsewhere 02/17/2013  . Other general symptoms(780.99) 02/17/2013  . Abnormality of gait 02/17/2013  . Cervical spondylosis with myelopathy 02/17/2013    Stefano Gaul 06/01/2015, 4:48 PM  Willow Ora, PTA, [facility] [address], [place], [place] [address] [phone] 07/31/2335, 1:22 PM   Recert completed, plan and goals updated by.Billie Ruddy, PT, DPT [facility] [address] [place], [place], [address] Phone: [phone]   Fax:  [phone] 06/01/2015, 4:48 PM

## 2015-06-01 NOTE — Addendum Note (Signed)
Addended by: Billie Ruddy A on: 06/01/2015 04:49 PM   Modules accepted: Orders

## 2015-06-06 ENCOUNTER — Ambulatory Visit: Payer: Medicare Other | Admitting: Physical Therapy

## 2015-06-06 DIAGNOSIS — R29898 Other symptoms and signs involving the musculoskeletal system: Secondary | ICD-10-CM

## 2015-06-06 DIAGNOSIS — R279 Unspecified lack of coordination: Secondary | ICD-10-CM | POA: Diagnosis not present

## 2015-06-06 DIAGNOSIS — R269 Unspecified abnormalities of gait and mobility: Secondary | ICD-10-CM

## 2015-06-06 NOTE — Patient Instructions (Addendum)
Hip Flexor Stretch   Lying on back near edge of bed, bend one leg, foot flat. Hang other leg over edge, relaxed, thigh resting entirely on bed for _2_ minutes. Do the same thing on the other side for 2 minutes. Do this twice on each leg (total of 4 minutes per side).   Advanced Exercise: Bend knee back keeping thigh in contact with bed.  http://gt2.exer.us/346   Copyright  VHI. All rights reserved.

## 2015-06-06 NOTE — Therapy (Signed)
[facility] [address] [place], [place], [address] Phone: [phone]   Fax:  [phone]  Physical Therapy Treatment  Patient Details  Name: Travis [name] MRN: [id number] Date of Birth: 1946/10/10 Referring Provider:  [name], MD  Encounter Date: 06/06/2015      PT End of Session - 06/06/15 1628    Visit Number 16   Number of Visits 33   Date for PT Re-Evaluation 08/02/15   Authorization Type Medicare-needs G codes every 10th visit   PT Start Time 1536   PT Stop Time 1617   PT Time Calculation (min) 41 min   Equipment Utilized During Treatment Gait belt   Activity Tolerance Patient tolerated treatment well   Behavior During Therapy [facility] for tasks assessed/performed      Past Medical History  Diagnosis Date  . PONV (postoperative nausea and vomiting)   . Hypertension   . DJD (degenerative joint disease)     WHOLE SPINE  . Hyperthyroidism     Had Iodine to resolve issue DX 10 YR AGO  . Hypothyroidism     Low d/t treatment of hyperthyroidism  . Sleep apnea     DX  2009/2010 study done at [facility]  . Type II diabetes mellitus     diet controlled  . Heart murmur     Evaluated by cardiologist 4-5 years ago but does not currently see a cardiologist  . Gait disorder   . Cervical myelopathy     with myelomalacia at C5-6 and C3-4  . RLS (restless legs syndrome)   . Peripheral neuropathy   . 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 Constipation - functional     controlled with miralax  . RBBB   . 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 CHF (congestive heart failure)   . Pneumonia   . Restless legs syndrome 03/14/2015    Past Surgical History  Procedure Laterality Date  . Cholecystectomy    . Back surgery      6 surgeries  . Lumbar laminectomy/decompression microdiscectomy  07/14/2012    Procedure: LUMBAR LAMINECTOMY/DECOMPRESSION MICRODISCECTOMY 2 LEVELS;  Surgeon: Eustace Moore, MD;  Location:  [place] [facility];  Service: Neurosurgery;  Laterality: Left;  Lumbar two three laminectomy, left thoracic four five transpedicular diskectomy    There were no vitals filed for this visit.  Visit Diagnosis:  Abnormality of gait  Weakness of both legs  Lack of coordination      Subjective Assessment - 06/06/15 1540    Subjective No falls to report. Pt expressing primary limitation is walking and "getting these legs to work right." Pt reports ability to walk with cane in office, church but uses rolling walker in home and rollator in driveway.   Currently in Pain? No/denies       Treatment Therapeutic Activities: - Pt ambulated x75' indoors with RW and min guard; noted the following gait deviations: B hip extension limited throughout gait cycle (neutral not achieved); decreased B hip/knee flexion during advancement of respective LE; decreased clearance of B LE's (limitation on LLE > RLE). - Performed multiple sit <> stand transfers from [facility] with multimodal cueing for setup, technique.  - See Patient Instruction section for pt education.  Therapeutic Exercises: - Supine, hook lying with LE hanging off EOM, instructed pt in hip flexor self-stretch x3 minutes per side. Required 4" lift under R foot initially for increased pt tolerance to stretch. Added to HEP. - Contract-relax PNF  alternating with prolonged passive stretch of B iliopsoas, B rectus femoris muscles x2 minutes per side. - In seated, pt performed B gastrocnemius self-stretch against stabilized 6" box 2 x30 sec per side; added B soleus self-stretch 2 x30 sec holds per side. Increased time required for bed mobility, transitional movements, and setup.         PT Short Term Goals - 05/04/15 1537    PT SHORT TERM GOAL #1   Title Independent with home exercise program to address lower extremity and core weakness and balance impairment. Target: 05/04/15   Status Achieved   PT SHORT TERM GOAL #2   Title Increase Berg Balance Test  score to 38/56. Target: 05/04/15   Baseline 05/04/15- 42/56   Status Achieved   PT SHORT TERM GOAL #3   Title Decrease TUG time to 22 seconds with LRAD. Target: 05/04/15   Baseline 05/04/15- 27.34 sec's with RW   Status Not Met   PT SHORT TERM GOAL #4   Title Increase gait speed with cane to 1.35 ft/sec for increased efficiency with ambulation.   Baseline 05/04/15- 1.58 ft/sec with walker   Status Achieved   PT SHORT TERM GOAL #5   Title Ambulate 300' with cane on indoor, level surfaces with supervision without scissoring while turning for improving independence with ambulation.   Baseline Extended to long term goal   Status On-going            Plan - 06/06/15 1632    Clinical Impression Statement Session focused on increasing hip flexor extensibility, strength to promote more normalized gait pattern. Pt will continue to benefit from skilled PT to increase stability with functional mobility and decrease fall risk.   PT Next Visit Plan Sit <> stand from lower surface heights. Continue gait training with SPC. Focus on upright posture (hip extension) for consistent BLE clearance during gait.   Consulted and Agree with Plan of Care Patient        Problem List Patient Active Problem List   Diagnosis Date Noted  . Restless legs syndrome 03/14/2015  . DOE (dyspnea on exertion) 03/09/2015  . Sinus bradycardia 09/21/2014  . Thoracic spondylosis with myelopathy 09/13/2014  . Acute diastolic heart failure 53/64/6803  . Cervical arthritis with myelopathy 07/18/2014  . Constipation - functional   . CAP (community acquired pneumonia) 07/10/2014  . Sepsis 07/10/2014  . HTN (hypertension) 07/10/2014  . OSA on CPAP 07/10/2014  . UTI (lower urinary tract infection) 07/10/2014  . Edema leg 07/10/2014  . Acute respiratory failure with hypoxia 07/10/2014  . 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 Other vitamin B12 deficiency anemia 07/13/2013  . Other acquired deformity of ankle and foot(736.79)  02/17/2013  . Polyneuropathy in other diseases classified elsewhere 02/17/2013  . Other general symptoms(780.99) 02/17/2013  . Abnormality of gait 02/17/2013  . Cervical spondylosis with myelopathy 02/17/2013    Billie Ruddy, PT, DPT [facility] [address] [place], [place], [address] Phone: [phone]   Fax:  [phone] 06/06/2015, 9:37 PM

## 2015-06-08 ENCOUNTER — Encounter: Payer: Self-pay | Admitting: Physical Therapy

## 2015-06-08 ENCOUNTER — Ambulatory Visit: Payer: Medicare Other | Admitting: Physical Therapy

## 2015-06-08 DIAGNOSIS — R279 Unspecified lack of coordination: Secondary | ICD-10-CM | POA: Diagnosis not present

## 2015-06-08 DIAGNOSIS — R29898 Other symptoms and signs involving the musculoskeletal system: Secondary | ICD-10-CM

## 2015-06-08 NOTE — Therapy (Signed)
[facility] [address] [place], [place], [address] Phone: [phone]   Fax:  [phone]  Physical Therapy Treatment  Patient Details  Name: Travis [name] MRN: [id number] Date of Birth: 1946/02/26 Referring Provider:  [name], MD  Encounter Date: 06/08/2015   06/08/15 1546  PT Visits / Re-Eval  Visit Number 17  Number of Visits 33  Date for PT Re-Evaluation 08/02/15  Authorization  Authorization Type Medicare-needs G codes every 10th visit  PT Time Calculation  PT Start Time 1532  PT Stop Time 1614  PT Time Calculation (min) 42 min  PT - End of Session  Equipment Utilized During Treatment Gait belt  Activity Tolerance Patient tolerated treatment well  Behavior During Therapy [facility] for tasks assessed/performed      Past Medical History  Diagnosis Date  . PONV (postoperative nausea and vomiting)   . Hypertension   . DJD (degenerative joint disease)     WHOLE SPINE  . Hyperthyroidism     Had Iodine to resolve issue DX 10 YR AGO  . Hypothyroidism     Low d/t treatment of hyperthyroidism  . Sleep apnea     DX  2009/2010 study done at [facility]  . Type II diabetes mellitus     diet controlled  . Heart murmur     Evaluated by cardiologist 4-5 years ago but does not currently see a cardiologist  . Gait disorder   . Cervical myelopathy     with myelomalacia at C5-6 and C3-4  . RLS (restless legs syndrome)   . Peripheral neuropathy   . 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 Constipation - functional     controlled with miralax  . RBBB   . 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 CHF (congestive heart failure)   . Pneumonia   . Restless legs syndrome 03/14/2015    Past Surgical History  Procedure Laterality Date  . Cholecystectomy    . Back surgery      6 surgeries  . Lumbar laminectomy/decompression microdiscectomy  07/14/2012    Procedure: LUMBAR LAMINECTOMY/DECOMPRESSION MICRODISCECTOMY 2  LEVELS;  Surgeon: Eustace Moore, MD;  Location: [place] [facility];  Service: Neurosurgery;  Laterality: Left;  Lumbar two three laminectomy, left thoracic four five transpedicular diskectomy    There were no vitals filed for this visit.  Visit Diagnosis:  Weakness of both legs  Lack of coordination    06/08/15 1536  Symptoms/Limitations  Subjective No fall to report. Reports increased right anterior hip pain after stretching last session, continues to hurt more in the am. Not hurting right now.  Pain Assessment  Currently in Pain? No/denies  Pain Score 0    Treatment: Exercises On mat Bil iliopsoas stretching x 3 minutes with leg off edge of mat and foot resting on top of 4 inch box Hip flexion off/onto mat with knee flexion x 10 reps each side Bridge 5 sec hold, 2 sets of 10 reps Sit/stand 2 sets of 10 reps from 20 inch mat  Standing with back against door jamb in open doorway, walker in front for balance as needed: Alternating reaching up to top of door frame, x 10 each side, 2 sets bil UE reach up overhead and back as far as can overhead x 10 reps, 2 sets Shoulder horizontal abduction x 10 reps with green band, 2 sets          PT Short Term Goals - 05/04/15 1537  PT SHORT TERM GOAL #1   Title Independent with home exercise program to address lower extremity and core weakness and balance impairment. Target: 05/04/15   Status Achieved   PT SHORT TERM GOAL #2   Title Increase Berg Balance Test score to 38/56. Target: 05/04/15   Baseline 05/04/15- 42/56   Status Achieved   PT SHORT TERM GOAL #3   Title Decrease TUG time to 22 seconds with LRAD. Target: 05/04/15   Baseline 05/04/15- 27.34 sec's with RW   Status Not Met   PT SHORT TERM GOAL #4   Title Increase gait speed with cane to 1.35 ft/sec for increased efficiency with ambulation.   Baseline 05/04/15- 1.58 ft/sec with walker   Status Achieved   PT SHORT TERM GOAL #5   Title Ambulate 300' with cane on indoor, level surfaces  with supervision without scissoring while turning for improving independence with ambulation.   Baseline Extended to long term goal   Status On-going           PT Long Term Goals - 06/06/15 2134    PT LONG TERM GOAL #1   Title Increase Berg Balance Test score to >45/56 for decreased fall risk. Target: 08/01/15.   Baseline 06/01/15: 44/56 scored today. All unmet goals carried over to new POC starting 06/04/15.   Status On-going   PT LONG TERM GOAL #2   Title Decrease TUG time to 19 seconds with LRAD without scissoring during turn. Target: 08/01/15.   Baseline 06/01/15: 22.47 sec's with walker   Status On-going   PT LONG TERM GOAL #3   Title Increase gait speed with cane on level surfaces to >1.8 ft/sec for decreased fall risk. Target 06/03/15   Baseline 06/01/15: 2.02 ft/sec with walker, not tested with cane   Status Achieved   PT LONG TERM GOAL #4   Title Ambulate 1000' with single point cane and MOD I for increased efficiency and independence with ambulation. Target: 08/01/15.   Baseline 06/01/15: pt walking some at home with cane inside only, where he can furniture/wall walk as well. Not up to this distance as yet.   Status On-going   PT LONG TERM GOAL #5   Title Increase score on paper copy of [employer] Balance Confidence test to 55% for improved balance confidence. Target: 08/01/15.   Baseline 06/01/15: scored 53.75% today.   Status On-going   PT LONG TERM GOAL #6   Title ------------------------------   PT LONG TERM GOAL #7   Title ------------------------------   PT LONG TERM GOAL #8   Title -----------------------------------        06/08/15 1547  Plan  Clinical Impression Statement Focused on hip stretching and postural stretching today without any issues reported. Pt making steady progress toward goals.  Pt will benefit from skilled therapeutic intervention in order to improve on the following deficits Abnormal gait;Decreased coordination;Difficulty walking;Impaired  flexibility;Decreased endurance;Decreased balance;Decreased strength  PT Frequency 2x / week  PT Duration 8 weeks (PT requesting additional 8 weeks (for total of 16 weeks))  PT Treatment/Interventions ADLs/Self Care Home Management;Therapeutic activities;Patient/family education;DME Instruction;Therapeutic exercise;Gait training;Balance training;Stair training;Neuromuscular re-education;Functional mobility training  PT Next Visit Plan hip stretching/strengthening, postural strengthening and gait with straight cane  Consulted and Agree with Plan of Care Patient      Problem List Patient Active Problem List   Diagnosis Date Noted  . Restless legs syndrome 03/14/2015  . DOE (dyspnea on exertion) 03/09/2015  . Sinus bradycardia 09/21/2014  . Thoracic spondylosis with myelopathy 09/13/2014  .  Acute diastolic heart failure 32/76/1470  . Cervical arthritis with myelopathy 07/18/2014  . Constipation - functional   . CAP (community acquired pneumonia) 07/10/2014  . Sepsis 07/10/2014  . HTN (hypertension) 07/10/2014  . OSA on CPAP 07/10/2014  . UTI (lower urinary tract infection) 07/10/2014  . Edema leg 07/10/2014  . Acute respiratory failure with hypoxia 07/10/2014  . 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 Other vitamin B12 deficiency anemia 07/13/2013  . 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 Polyneuropathy in other diseases classified elsewhere 02/17/2013  . Other general symptoms(780.99) 02/17/2013  . Abnormality of gait 02/17/2013  . Cervical spondylosis with myelopathy 02/17/2013    Willow Ora 06/10/2015, 7:36 PM  Willow Ora, PTA, [facility] [address], [place], [place] [address] [phone] 06/10/2015, 7:36 PM

## 2015-06-13 ENCOUNTER — Ambulatory Visit: Payer: Medicare Other | Admitting: Physical Therapy

## 2015-06-13 DIAGNOSIS — R279 Unspecified lack of coordination: Secondary | ICD-10-CM | POA: Diagnosis not present

## 2015-06-13 DIAGNOSIS — R269 Unspecified abnormalities of gait and mobility: Secondary | ICD-10-CM

## 2015-06-13 DIAGNOSIS — R29898 Other symptoms and signs involving the musculoskeletal system: Secondary | ICD-10-CM

## 2015-06-13 NOTE — Therapy (Signed)
[facility] [address] [place], [place], [address] Phone: [phone]   Fax:  [phone]  Physical Therapy Treatment  Patient Details  Name: Travis [name] MRN: [id number] Date of Birth: Apr 24, 1946 Referring Provider:  [name], MD  Encounter Date: 06/13/2015      PT End of Session - 06/13/15 1648    Visit Number 18   Number of Visits 33   Date for PT Re-Evaluation 08/02/15   Authorization Type Medicare-needs G codes every 10th visit   PT Start Time 1533   PT Stop Time 1616   PT Time Calculation (min) 43 min   Activity Tolerance Patient tolerated treatment well   Behavior During Therapy [facility] for tasks assessed/performed      Past Medical History  Diagnosis Date  . PONV (postoperative nausea and vomiting)   . Hypertension   . DJD (degenerative joint disease)     WHOLE SPINE  . Hyperthyroidism     Had Iodine to resolve issue DX 10 YR AGO  . Hypothyroidism     Low d/t treatment of hyperthyroidism  . Sleep apnea     DX  2009/2010 study done at [facility]  . Type II diabetes mellitus     diet controlled  . Heart murmur     Evaluated by cardiologist 4-5 years ago but does not currently see a cardiologist  . Gait disorder   . Cervical myelopathy     with myelomalacia at C5-6 and C3-4  . RLS (restless legs syndrome)   . Peripheral neuropathy   . 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 Constipation - functional     controlled with miralax  . RBBB   . 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 CHF (congestive heart failure)   . Pneumonia   . Restless legs syndrome 03/14/2015    Past Surgical History  Procedure Laterality Date  . Cholecystectomy    . Back surgery      6 surgeries  . Lumbar laminectomy/decompression microdiscectomy  07/14/2012    Procedure: LUMBAR LAMINECTOMY/DECOMPRESSION MICRODISCECTOMY 2 LEVELS;  Surgeon: Eustace Moore, MD;  Location: [place] [facility];  Service: Neurosurgery;   Laterality: Left;  Lumbar two three laminectomy, left thoracic four five transpedicular diskectomy    There were no vitals filed for this visit.  Visit Diagnosis:  Weakness of both legs  Lack of coordination  Abnormality of gait      Subjective Assessment - 06/13/15 1537    Subjective Pt reports no falls, no new issues. Reports difficulty with walking program at this time due to heat but feels as though posture has improved with stretching. Does report difficulty with hip flexor stretch due to height of bed. Expresses difficulty with sit > stand from low surfaces.   Patient Stated Goals Pt's goal for therapy is to ambulate without the walker.    Currently in Pain? No/denies        Treatment   Therapeutic Exercises: - Supine, hook lying with respective LE off EOM, pt performed B iliopsoas, rectus femoris stretch 3 x1-minute holds per side for postural normalization.  - Due to pt-reported difficulty with above hip flexor stretch (see Subjective), modified HEP to instead include standing hip flexor stretch with B UE support at countertop. See Patient Instructions for further detail. Pt gave effective return demonstration of stretch 2 x1-minute holds per side.  - Seated EOM, pt performed B hamstring self-stretch x 1-minute holds per side.  Therapeutic Activities: -  Explained effect of hamstring tightness on pelvic posture, which in turn affects ease of sit > stand. Utilized Air traffic controller for reinforcement of concept. See Patient Education for further detail. - Performed blocked practice of graded sit <> stand transfers from progressively lower mat table height. Initially, utilized wedge for more neutral pelvic tilt. Incorporated anterior reaching to facilitate full anterior weight shift.  - Gait 2 x75' over indoor surfaces with rolling walker, mod I with notable improvement in B hip extension. Also noted improved placement/proximity of RW from pt.          PT Education - 06/13/15  1600    Education provided Yes   Education Details Effect of decreased muscle extensiblity on alignment, mobility with emphasis on posteior pelvic tilt, sit > stand.   Person(s) Educated Patient   Methods Demonstration;Explanation   Comprehension Verbalized understanding;Returned demonstration          PT Short Term Goals - 05/04/15 1537    PT SHORT TERM GOAL #1   Title Independent with home exercise program to address lower extremity and core weakness and balance impairment. Target: 05/04/15   Status Achieved   PT SHORT TERM GOAL #2   Title Increase Berg Balance Test score to 38/56. Target: 05/04/15   Baseline 05/04/15- 42/56   Status Achieved   PT SHORT TERM GOAL #3   Title Decrease TUG time to 22 seconds with LRAD. Target: 05/04/15   Baseline 05/04/15- 27.34 sec's with RW   Status Not Met   PT SHORT TERM GOAL #4   Title Increase gait speed with cane to 1.35 ft/sec for increased efficiency with ambulation.   Baseline 05/04/15- 1.58 ft/sec with walker   Status Achieved   PT SHORT TERM GOAL #5   Title Ambulate 300' with cane on indoor, level surfaces with supervision without scissoring while turning for improving independence with ambulation.   Baseline Extended to long term goal   Status On-going           PT Long Term Goals - 06/13/15 1541    PT LONG TERM GOAL #1   Title Increase Berg Balance Test score to >45/56 for decreased fall risk. New target date: 08/01/15.   Baseline 06/01/15: 44/56 scored today. All unmet goals carried over to new POC starting 06/04/15.   Status On-going   PT LONG TERM GOAL #2   Title Decrease TUG time to 19 seconds with LRAD without scissoring during turn. Target: 08/01/15.   Baseline 06/01/15: 22.47 sec's with walker   Status On-going   PT LONG TERM GOAL #3   Title Increase gait speed with cane on level surfaces to >1.8 ft/sec for decreased fall risk. Target 06/03/15   Baseline 06/01/15: 2.02 ft/sec with walker, not tested with cane   Status Achieved   PT LONG  TERM GOAL #4   Title Ambulate 1000' with single point cane and MOD I for increased efficiency and independence with ambulation. Target: 08/01/15.   Baseline 06/01/15: pt walking some at home with cane inside only, where he can furniture/wall walk as well. Not up to this distance as yet.   Status On-going   PT LONG TERM GOAL #5   Title Increase score on paper copy of Acitivities Balance Confidence test to 55% for improved balance confidence. Target: 08/01/15.   Baseline 06/01/15: scored 53.75% today.   Status On-going   PT LONG TERM GOAL #6   Title ------------------------------   PT LONG TERM GOAL #7   Title ------------------------------   PT LONG  TERM GOAL #8   Title -----------------------------------               Plan - 06/13/15 1649    Clinical Impression Statement Focused on increasing muscular extensibility for improved postural alignment; functional strengthening to increase stability/independence with transfers. Pt tolerated session well and exhibited within-ession effective carryover of  education, cueing. Continue per POC.   Pt will benefit from skilled therapeutic intervention in order to improve on the following deficits Abnormal gait;Decreased coordination;Difficulty walking;Impaired flexibility;Decreased endurance;Decreased balance;Decreased strength   Rehab Potential Good   PT Treatment/Interventions ADLs/Self Care Home Management;Therapeutic activities;Patient/family education;DME Instruction;Therapeutic exercise;Gait training;Balance training;Stair training;Neuromuscular re-education;Functional mobility training   PT Next Visit Plan hip stretching/strengthening, improved postural alignment. Increasing independence with sit <>stand; progress to gait with straight cane   Consulted and Agree with Plan of Care Patient        Problem List Patient Active Problem List   Diagnosis Date Noted  . Restless legs syndrome 03/14/2015  . DOE (dyspnea on exertion) 03/09/2015  .  Sinus bradycardia 09/21/2014  . Thoracic spondylosis with myelopathy 09/13/2014  . Acute diastolic heart failure 53/66/4403  . Cervical arthritis with myelopathy 07/18/2014  . Constipation - functional   . CAP (community acquired pneumonia) 07/10/2014  . Sepsis 07/10/2014  . HTN (hypertension) 07/10/2014  . OSA on CPAP 07/10/2014  . UTI (lower urinary tract infection) 07/10/2014  . Edema leg 07/10/2014  . Acute respiratory failure with hypoxia 07/10/2014  . 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 Other vitamin B12 deficiency anemia 07/13/2013  . 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 Polyneuropathy in other diseases classified elsewhere 02/17/2013  . Other general symptoms(780.99) 02/17/2013  . Abnormality of gait 02/17/2013  . Cervical spondylosis with myelopathy 02/17/2013    Billie Ruddy, PT, DPT [facility] [address] [place], [place], [address] Phone: [phone]   Fax:  [phone] 06/13/2015, 5:07 PM

## 2015-06-13 NOTE — Patient Instructions (Signed)
HIP: Short Hip Flexors - Standing  Do this stretch for the front of your hips instead of the stretch lying on your back, hanging leg off edge of bed.    Place hand on wall for support. Place one leg in front of other. Bend both knees. Lift chest and rotate pelvis forward. Hold _60__ seconds. Do this at least twice per day on each side.   Copyright  VHI. All rights reserved.

## 2015-06-15 ENCOUNTER — Encounter: Payer: Self-pay | Admitting: Physical Therapy

## 2015-06-15 ENCOUNTER — Ambulatory Visit: Payer: Medicare Other | Admitting: Physical Therapy

## 2015-06-15 DIAGNOSIS — R29898 Other symptoms and signs involving the musculoskeletal system: Secondary | ICD-10-CM

## 2015-06-15 DIAGNOSIS — R279 Unspecified lack of coordination: Secondary | ICD-10-CM

## 2015-06-15 NOTE — Therapy (Signed)
[facility] [address] [place], [place], [address] Phone: [phone]   Fax:  [phone]  Physical Therapy Treatment  Patient Details  Name: Travis [name] MRN: [id number] Date of Birth: 1946-05-17 Referring Provider:  [name], MD  Encounter Date: 06/15/2015      PT End of Session - 06/15/15 1541    Visit Number 19   Number of Visits 33   Date for PT Re-Evaluation 08/02/15   Authorization Type Medicare-needs G codes every 10th visit   PT Start Time 1535   PT Stop Time 1615   PT Time Calculation (min) 40 min   Activity Tolerance Patient tolerated treatment well   Behavior During Therapy [facility] for tasks assessed/performed      Past Medical History  Diagnosis Date  . PONV (postoperative nausea and vomiting)   . Hypertension   . DJD (degenerative joint disease)     WHOLE SPINE  . Hyperthyroidism     Had Iodine to resolve issue DX 10 YR AGO  . Hypothyroidism     Low d/t treatment of hyperthyroidism  . Sleep apnea     DX  2009/2010 study done at [facility]  . Type II diabetes mellitus     diet controlled  . Heart murmur     Evaluated by cardiologist 4-5 years ago but does not currently see a cardiologist  . Gait disorder   . Cervical myelopathy     with myelomalacia at C5-6 and C3-4  . RLS (restless legs syndrome)   . Peripheral neuropathy   . 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 Constipation - functional     controlled with miralax  . RBBB   . 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 CHF (congestive heart failure)   . Pneumonia   . Restless legs syndrome 03/14/2015    Past Surgical History  Procedure Laterality Date  . Cholecystectomy    . Back surgery      6 surgeries  . Lumbar laminectomy/decompression microdiscectomy  07/14/2012    Procedure: LUMBAR LAMINECTOMY/DECOMPRESSION MICRODISCECTOMY 2 LEVELS;  Surgeon: Eustace Moore, MD;  Location: [place] [facility];  Service: Neurosurgery;   Laterality: Left;  Lumbar two three laminectomy, left thoracic four five transpedicular diskectomy    There were no vitals filed for this visit.  Visit Diagnosis:  Weakness of both legs  Lack of coordination      Subjective Assessment - 06/15/15 1536    Subjective No new complaints. No falls or pain to report. Was worn out after last session with increased leg burning that evening, better by morning.   Currently in Pain? No/denies     Treatment: Exercises Hip flexor stretch with leg off edge of mat and left foot resting on 4 inch box/right foot on floor, 2-3 minute hold each side.  Hip flexion off/onto mat with 3# ankle weight x 10 reps, 2 sets, bil legs Seated edge of mat: hamstring stretch 1 minute hold x 3 reps each leg  standing with back against doorframe: cues for bil knee/hip extension and to maintain contact with door frame. Alternating reaching up to top of door frame for side elongation stretching, 3- 5 sec holds, x 10 reps each side bil forward flexion of UE's, reaching up toward top of door and the progressing the stretch to reaching back overhead as far as he can for back extension/trunk elongation stretch. 3-5 sec hold x 10 reps.   Sit<> stands with UE assist  from progressively lower heights:cues on anterior weight shifting to stand and for full upright posture. X 10 reps from 24.5 inches X 10 reps from 23.5 inches X 10 reps from 22.5 inches       PT Short Term Goals - 05/04/15 1537    PT SHORT TERM GOAL #1   Title Independent with home exercise program to address lower extremity and core weakness and balance impairment. Target: 05/04/15   Status Achieved   PT SHORT TERM GOAL #2   Title Increase Berg Balance Test score to 38/56. Target: 05/04/15   Baseline 05/04/15- 42/56   Status Achieved   PT SHORT TERM GOAL #3   Title Decrease TUG time to 22 seconds with LRAD. Target: 05/04/15   Baseline 05/04/15- 27.34 sec's with RW   Status Not Met   PT SHORT TERM GOAL #4    Title Increase gait speed with cane to 1.35 ft/sec for increased efficiency with ambulation.   Baseline 05/04/15- 1.58 ft/sec with walker   Status Achieved   PT SHORT TERM GOAL #5   Title Ambulate 300' with cane on indoor, level surfaces with supervision without scissoring while turning for improving independence with ambulation.   Baseline Extended to long term goal   Status On-going           PT Long Term Goals - 06/13/15 1541    PT LONG TERM GOAL #1   Title Increase Berg Balance Test score to >45/56 for decreased fall risk. New target date: 08/01/15.   Baseline 06/01/15: 44/56 scored today. All unmet goals carried over to new POC starting 06/04/15.   Status On-going   PT LONG TERM GOAL #2   Title Decrease TUG time to 19 seconds with LRAD without scissoring during turn. Target: 08/01/15.   Baseline 06/01/15: 22.47 sec's with walker   Status On-going   PT LONG TERM GOAL #3   Title Increase gait speed with cane on level surfaces to >1.8 ft/sec for decreased fall risk. Target 06/03/15   Baseline 06/01/15: 2.02 ft/sec with walker, not tested with cane   Status Achieved   PT LONG TERM GOAL #4   Title Ambulate 1000' with single point cane and MOD I for increased efficiency and independence with ambulation. Target: 08/01/15.   Baseline 06/01/15: pt walking some at home with cane inside only, where he can furniture/wall walk as well. Not up to this distance as yet.   Status On-going   PT LONG TERM GOAL #5   Title Increase score on paper copy of Ac[employer]ivities Balance Confidence test to 55% for improved balance confidence. Target: 08/01/15.   Baseline 06/01/15: scored 53.75% today.   Status On-going   PT LONG TERM GOAL #6   Title ------------------------------   PT LONG TERM GOAL #7   Title ------------------------------   PT LONG TERM GOAL #8   Title -----------------------------------       Problem List Patient Active Problem List   Diagnosis Date Noted  . Restless legs syndrome 03/14/2015  . DOE  (dyspnea on exertion) 03/09/2015  . Sinus bradycardia 09/21/2014  . Thoracic spondylosis with myelopathy 09/13/2014  . Acute diastolic heart failure 28/31/5176  . Cervical arthritis with myelopathy 07/18/2014  . Constipation - functional   . CAP (community acquired pneumonia) 07/10/2014  . Sepsis 07/10/2014  . HTN (hypertension) 07/10/2014  . OSA on CPAP 07/10/2014  . UTI (lower urinary tract infection) 07/10/2014  . Edema leg 07/10/2014  . Acute respiratory failure with hypoxia 07/10/2014  . Lumbosacral spinal  stenosis 03/13/2014  . Other vitamin B12 deficiency anemia 07/13/2013  . 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 Polyneuropathy in other diseases classified elsewhere 02/17/2013  . Other general symptoms(780.99) 02/17/2013  . Abnormality of gait 02/17/2013  . Cervical spondylosis with myelopathy 02/17/2013    Willow Ora 06/15/2015, 6:47 PM  Willow Ora, PTA, [facility] [address] [place], [place] [address] [phone] 06/15/2015, 6:47 PM

## 2015-06-20 ENCOUNTER — Encounter: Payer: Self-pay | Admitting: Physical Therapy

## 2015-06-20 ENCOUNTER — Ambulatory Visit: Payer: Medicare Other | Admitting: Physical Therapy

## 2015-06-20 DIAGNOSIS — R269 Unspecified abnormalities of gait and mobility: Secondary | ICD-10-CM

## 2015-06-20 DIAGNOSIS — R279 Unspecified lack of coordination: Secondary | ICD-10-CM | POA: Diagnosis not present

## 2015-06-20 DIAGNOSIS — R262 Difficulty in walking, not elsewhere classified: Secondary | ICD-10-CM

## 2015-06-20 DIAGNOSIS — R29898 Other symptoms and signs involving the musculoskeletal system: Secondary | ICD-10-CM

## 2015-06-21 NOTE — Therapy (Signed)
[facility] [address] [place], [place], [address] Phone: [phone]   Fax:  [phone]  Physical Therapy Treatment  Patient Details  Name: Travis [name] MRN: [id number] Date of Birth: 1946-09-15 Referring Provider:  [name], MD  Encounter Date: 06/20/2015     Past Medical History  Diagnosis Date  . PONV (postoperative nausea and vomiting)   . Hypertension   . DJD (degenerative joint disease)     WHOLE SPINE  . Hyperthyroidism     Had Iodine to resolve issue DX 10 YR AGO  . Hypothyroidism     Low d/t treatment of hyperthyroidism  . Sleep apnea     DX  2009/2010 study done at [facility]  . Type II diabetes mellitus     diet controlled  . Heart murmur     Evaluated by cardiologist 4-5 years ago but does not currently see a cardiologist  . Gait disorder   . Cervical myelopathy     with myelomalacia at C5-6 and C3-4  . RLS (restless legs syndrome)   . Peripheral neuropathy   . 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 Constipation - functional     controlled with miralax  . RBBB   . 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 CHF (congestive heart failure)   . Pneumonia   . Restless legs syndrome 03/14/2015    Past Surgical History  Procedure Laterality Date  . Cholecystectomy    . Back surgery      6 surgeries  . Lumbar laminectomy/decompression microdiscectomy  07/14/2012    Procedure: LUMBAR LAMINECTOMY/DECOMPRESSION MICRODISCECTOMY 2 LEVELS;  Surgeon: Eustace Moore, MD;  Location: [place] [facility];  Service: Neurosurgery;  Laterality: Left;  Lumbar two three laminectomy, left thoracic four five transpedicular diskectomy    There were no vitals filed for this visit.  Visit Diagnosis:  Weakness of both legs  Lack of coordination  Abnormality of gait  Difficulty in walking                 PT Short Term Goals - 05/04/15 1537    PT SHORT TERM GOAL #1   Title Independent with home  exercise program to address lower extremity and core weakness and balance impairment. Target: 05/04/15   Status Achieved   PT SHORT TERM GOAL #2   Title Increase Berg Balance Test score to 38/56. Target: 05/04/15   Baseline 05/04/15- 42/56   Status Achieved   PT SHORT TERM GOAL #3   Title Decrease TUG time to 22 seconds with LRAD. Target: 05/04/15   Baseline 05/04/15- 27.34 sec's with RW   Status Not Met   PT SHORT TERM GOAL #4   Title Increase gait speed with cane to 1.35 ft/sec for increased efficiency with ambulation.   Baseline 05/04/15- 1.58 ft/sec with walker   Status Achieved   PT SHORT TERM GOAL #5   Title Ambulate 300' with cane on indoor, level surfaces with supervision without scissoring while turning for improving independence with ambulation.   Baseline Extended to long term goal   Status On-going           PT Long Term Goals - 06/13/15 1541    PT LONG TERM GOAL #1   Title Increase Berg Balance Test score to >45/56 for decreased fall risk. New target date: 08/01/15.   Baseline 06/01/15: 44/56 scored today. All unmet goals carried over to new POC starting 06/04/15.   Status On-going  PT LONG TERM GOAL #2   Title Decrease TUG time to 19 seconds with LRAD without scissoring during turn. Target: 08/01/15.   Baseline 06/01/15: 22.47 sec's with walker   Status On-going   PT LONG TERM GOAL #3   Title Increase gait speed with cane on level surfaces to >1.8 ft/sec for decreased fall risk. Target 06/03/15   Baseline 06/01/15: 2.02 ft/sec with walker, not tested with cane   Status Achieved   PT LONG TERM GOAL #4   Title Ambulate 1000' with single point cane and MOD I for increased efficiency and independence with ambulation. Target: 08/01/15.   Baseline 06/01/15: pt walking some at home with cane inside only, where he can furniture/wall walk as well. Not up to this distance as yet.   Status On-going   PT LONG TERM GOAL #5   Title Increase score on paper copy of Acitivities Balance Confidence test to  55% for improved balance confidence. Target: 08/01/15.   Baseline 06/01/15: scored 53.75% today.   Status On-going   PT LONG TERM GOAL #6   Title ------------------------------   PT LONG TERM GOAL #7   Title ------------------------------   PT LONG TERM GOAL #8   Title -----------------------------------             G-Codes - 06-26-15 1427    Functional Assessment Tool Used Merrilee Jansky = 47/56   Functional Limitation Mobility: Walking and moving around   Mobility: Walking and Moving Around Current Status [phone]) At least 1 percent but less than 20 percent impaired, limited or restricted   Mobility: Walking and Moving Around Goal Status [phone]) At least 1 percent but less than 20 percent impaired, limited or restricted      Problem List Patient Active Problem List   Diagnosis Date Noted  . Restless legs syndrome 03/14/2015  . DOE (dyspnea on exertion) 03/09/2015  . Sinus bradycardia 09/21/2014  . Thoracic spondylosis with myelopathy 09/13/2014  . Acute diastolic heart failure 94/80/1655  . Cervical arthritis with myelopathy 07/18/2014  . Constipation - functional   . CAP (community acquired pneumonia) 07/10/2014  . Sepsis 07/10/2014  . HTN (hypertension) 07/10/2014  . OSA on CPAP 07/10/2014  . UTI (lower urinary tract infection) 07/10/2014  . Edema leg 07/10/2014  . Acute respiratory failure with hypoxia 07/10/2014  . 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 Other vitamin B12 deficiency anemia 07/13/2013  . 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 Polyneuropathy in other diseases classified elsewhere 02/17/2013  . Other general symptoms(780.99) 02/17/2013  . Abnormality of gait 02/17/2013  . Cervical spondylosis with myelopathy 02/17/2013     Willow Ora, PTA, [facility] Neuro [facility] [address] [place], [place] [address] [phone] 2015/06/26, 2:28 PM    Physical Therapy Progress Note  Dates of Reporting Period: 04/04/2015  to 06/20/2015  Objective Reports of Subjective Statement: See above.    Objective Measurements:  Berg, TUG, gait speed, Activities Balance Confidence Test  Goal Update: See above.   Plan: Continue per POC, as pt has demonstrated measurable functional progress.   Reason Skilled Services are Required: to address gait abnormality/stability, improve postural alignment/control, increase efficiency of/independence with ambulation and functional mobility; and decrease fall risk.    Billie Ruddy, PT, DPT [facility] [address] [place], [place], [address] Phone: [phone]   Fax:  [phone] 2015/06/26, 2:30 PM

## 2015-06-22 ENCOUNTER — Ambulatory Visit: Payer: Medicare Other | Admitting: Physical Therapy

## 2015-06-22 ENCOUNTER — Encounter: Payer: Self-pay | Admitting: Physical Therapy

## 2015-06-22 DIAGNOSIS — R279 Unspecified lack of coordination: Secondary | ICD-10-CM

## 2015-06-22 DIAGNOSIS — R269 Unspecified abnormalities of gait and mobility: Secondary | ICD-10-CM

## 2015-06-22 DIAGNOSIS — R29898 Other symptoms and signs involving the musculoskeletal system: Secondary | ICD-10-CM

## 2015-06-22 DIAGNOSIS — R262 Difficulty in walking, not elsewhere classified: Secondary | ICD-10-CM

## 2015-06-22 NOTE — Patient Instructions (Signed)
Adductor Stretch - Supine   Lie on bed, knees bent, feet flat. Keeping feet together, lower knees toward bed until stretch felt at inner thighs. Hold 20-30 seconds. Repeat 10 times. Do _1-2__ times per day.  Copyright  VHI. All rights reserved.  Adductors, Standing With Chair   Face bed and prop on elbows. Move knees and feet apart. Keep spine straight. Hold _2-__ seconds. Repeat _3__ times per session. Do _1__ sessions per day.  Copyright  VHI. All rights reserved.  Figure Four: Stationary   Seated: Lift one foot medially. Grasp ankle and heel. Lift ankle to prop on opposite knee. Hold 20 seconds. 3 times each side. 1-2 times a day.  Copyright  VHI. All rights reserved.  HIP: Adductors   Lie on edge of surface. Place leg off surface. Hold __30_ seconds. _1-3__ reps per set, __1_ sets per day, 5-7__ days per week   Copyright  VHI. All rights reserved.

## 2015-06-22 NOTE — Therapy (Signed)
[facility] [address] [place], [place], [address] Phone: [phone]   Fax:  [phone]  Physical Therapy Treatment  Patient Details  Name: Travis [name] MRN: [id number] Date of Birth: [birth year] Referring Provider:  [name], MD  Encounter Date: 06/22/2015      PT End of Session - 06/22/15 1536    Visit Number 21   Number of Visits 33   Date for PT Re-Evaluation 08/02/15   Authorization Type Medicare-needs G codes every 10th visit   PT Start Time 1533   PT Stop Time 1620   PT Time Calculation (min) 47 min   Equipment Utilized During Treatment Gait belt   Activity Tolerance Patient tolerated treatment well   Behavior During Therapy [facility] for tasks assessed/performed      Past Medical History  Diagnosis Date  . PONV (postoperative nausea and vomiting)   . Hypertension   . DJD (degenerative joint disease)     WHOLE SPINE  . Hyperthyroidism     Had Iodine to resolve issue DX 10 YR AGO  . Hypothyroidism     Low d/t treatment of hyperthyroidism  . Sleep apnea     DX  2009/2010 study done at [facility]  . Type II diabetes mellitus     diet controlled  . Heart murmur     Evaluated by cardiologist 4-5 years ago but does not currently see a cardiologist  . Gait disorder   . Cervical myelopathy     with myelomalacia at C5-6 and C3-4  . RLS (restless legs syndrome)   . Peripheral neuropathy   . 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 Constipation - functional     controlled with miralax  . RBBB   . 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 CHF (congestive heart failure)   . Pneumonia   . Restless legs syndrome 03/14/2015    Past Surgical History  Procedure Laterality Date  . Cholecystectomy    . Back surgery      6 surgeries  . Lumbar laminectomy/decompression microdiscectomy  07/14/2012    Procedure: LUMBAR LAMINECTOMY/DECOMPRESSION MICRODISCECTOMY 2 LEVELS;  Surgeon: Eustace Moore, MD;   Location: [place] [facility];  Service: Neurosurgery;  Laterality: Left;  Lumbar two three laminectomy, left thoracic four five transpedicular diskectomy    There were no vitals filed for this visit.  Visit Diagnosis:  Weakness of both legs  Lack of coordination  Abnormality of gait  Difficulty in walking      Subjective Assessment - 06/22/15 1536    Subjective No new complaints. No falls to report or pain.   Currently in Pain? No/denies   Pain Score 0-No pain      Treatment: Exercises On mat Bil iliopsoas stretching x 3 minutes with leg off edge of mat and foot resting on top of 4 inch box Hip flexion off/onto mat with knee flexion x 10 reps each side Bridge 5 sec hold, 2 sets of 10 reps Hip adductor stretching, added to HEP. Refer to pt instructions for further details.  Gait: 345 x 1 with straight cane, min assist with cues on posture, step length, to increase base of support and for increased foot clearance with gait.        PT Education - 06/22/15 1618    Education provided Yes   Education Details HEP: stretching for inner thigh and piriformis   Person(s) Educated Patient   Methods Explanation;Demonstration;Handout   Comprehension Verbalized understanding;Returned demonstration;Verbal  cues required          PT Short Term Goals - 05/04/15 1537    PT SHORT TERM GOAL #1   Title Independent with home exercise program to address lower extremity and core weakness and balance impairment. Target: 05/04/15   Status Achieved   PT SHORT TERM GOAL #2   Title Increase Berg Balance Test score to 38/56. Target: 05/04/15   Baseline 05/04/15- 42/56   Status Achieved   PT SHORT TERM GOAL #3   Title Decrease TUG time to 22 seconds with LRAD. Target: 05/04/15   Baseline 05/04/15- 27.34 sec's with RW   Status Not Met   PT SHORT TERM GOAL #4   Title Increase gait speed with cane to 1.35 ft/sec for increased efficiency with ambulation.   Baseline 05/04/15- 1.58 ft/sec with walker   Status  Achieved   PT SHORT TERM GOAL #5   Title Ambulate 300' with cane on indoor, level surfaces with supervision without scissoring while turning for improving independence with ambulation.   Baseline Extended to long term goal   Status On-going           PT Long Term Goals - 06/13/15 1541    PT LONG TERM GOAL #1   Title Increase Berg Balance Test score to >45/56 for decreased fall risk. New target date: 08/01/15.   Baseline 06/01/15: 44/56 scored today. All unmet goals carried over to new POC starting 06/04/15.   Status On-going   PT LONG TERM GOAL #2   Title Decrease TUG time to 19 seconds with LRAD without scissoring during turn. Target: 08/01/15.   Baseline 06/01/15: 22.47 sec's with walker   Status On-going   PT LONG TERM GOAL #3   Title Increase gait speed with cane on level surfaces to >1.8 ft/sec for decreased fall risk. Target 06/03/15   Baseline 06/01/15: 2.02 ft/sec with walker, not tested with cane   Status Achieved   PT LONG TERM GOAL #4   Title Ambulate 1000' with single point cane and MOD I for increased efficiency and independence with ambulation. Target: 08/01/15.   Baseline 06/01/15: pt walking some at home with cane inside only, where he can furniture/wall walk as well. Not up to this distance as yet.   Status On-going   PT LONG TERM GOAL #5   Title Increase score on paper copy of Acitivities Balance Confidence test to 55% for improved balance confidence. Target: 08/01/15.   Baseline 06/01/15: scored 53.75% today.   Status On-going   PT LONG TERM GOAL #6   Title ------------------------------   PT LONG TERM GOAL #7   Title ------------------------------   PT LONG TERM GOAL #8   Title -----------------------------------               Plan - 06/22/15 1537    Clinical Impression Statement Pt with improved/increased gait pattern/distance, respectively today. Added hip adductor stretching to HEP. Pt making steady progress toward goals.   Pt will benefit from skilled therapeutic  intervention in order to improve on the following deficits Abnormal gait;Decreased coordination;Difficulty walking;Impaired flexibility;Decreased endurance;Decreased balance;Decreased strength   Rehab Potential Good   PT Treatment/Interventions ADLs/Self Care Home Management;Therapeutic activities;Patient/family education;DME Instruction;Therapeutic exercise;Gait training;Balance training;Stair training;Neuromuscular re-education;Functional mobility training   PT Next Visit Plan hip stretching/strengthening, improved postural alignment. Increasing independence with sit <>stand; progress to gait with straight cane   Consulted and Agree with Plan of Care Patient        Problem List Patient Active Problem List  Diagnosis Date Noted  . Restless legs syndrome 03/14/2015  . DOE (dyspnea on exertion) 03/09/2015  . Sinus bradycardia 09/21/2014  . Thoracic spondylosis with myelopathy 09/13/2014  . Acute diastolic heart failure 80/88/1103  . Cervical arthritis with myelopathy 07/18/2014  . Constipation - functional   . CAP (community acquired pneumonia) 07/10/2014  . Sepsis 07/10/2014  . HTN (hypertension) 07/10/2014  . OSA on CPAP 07/10/2014  . UTI (lower urinary tract infection) 07/10/2014  . Edema leg 07/10/2014  . Acute respiratory failure with hypoxia 07/10/2014  . 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 Other vitamin B12 deficiency anemia 07/13/2013  . 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 Polyneuropathy in other diseases classified elsewhere 02/17/2013  . Other general symptoms(780.99) 02/17/2013  . Abnormality of gait 02/17/2013  . Cervical spondylosis with myelopathy 02/17/2013    Willow Ora 06/22/2015, 4:21 PM  Willow Ora, PTA, [facility] [address] [place], [place] [address] [phone] 06/22/2015, 4:21 PM

## 2015-06-24 ENCOUNTER — Other Ambulatory Visit: Payer: Self-pay | Admitting: Physical Medicine and Rehabilitation

## 2015-06-27 ENCOUNTER — Ambulatory Visit: Payer: Medicare Other | Admitting: Physical Therapy

## 2015-06-27 DIAGNOSIS — R269 Unspecified abnormalities of gait and mobility: Secondary | ICD-10-CM

## 2015-06-27 DIAGNOSIS — R29898 Other symptoms and signs involving the musculoskeletal system: Secondary | ICD-10-CM

## 2015-06-27 DIAGNOSIS — R262 Difficulty in walking, not elsewhere classified: Secondary | ICD-10-CM

## 2015-06-27 DIAGNOSIS — R279 Unspecified lack of coordination: Secondary | ICD-10-CM | POA: Diagnosis not present

## 2015-06-28 ENCOUNTER — Encounter: Payer: Self-pay | Admitting: Physical Therapy

## 2015-06-28 NOTE — Therapy (Signed)
[facility] [address] [place], [place], [address] Phone: [phone]   Fax:  [phone]  Physical Therapy Treatment  Patient Details  Name: Travis [name] MRN: [id number] Date of Birth: 04/28/1946 Referring Provider:  [name], MD  Encounter Date: 06/27/2015      PT End of Session - 06/27/15 0802    Visit Number 22   Number of Visits 33   Date for PT Re-Evaluation 08/02/15   Authorization Type Medicare-needs G codes every 10th visit   PT Start Time 1533   PT Stop Time 1615   PT Time Calculation (min) 42 min   Equipment Utilized During Treatment Gait belt   Activity Tolerance Patient tolerated treatment well   Behavior During Therapy [facility] for tasks assessed/performed      Past Medical History  Diagnosis Date  . PONV (postoperative nausea and vomiting)   . Hypertension   . DJD (degenerative joint disease)     WHOLE SPINE  . Hyperthyroidism     Had Iodine to resolve issue DX 10 YR AGO  . Hypothyroidism     Low d/t treatment of hyperthyroidism  . Sleep apnea     DX  2009/2010 study done at [facility]  . Type II diabetes mellitus     diet controlled  . Heart murmur     Evaluated by cardiologist 4-5 years ago but does not currently see a cardiologist  . Gait disorder   . Cervical myelopathy     with myelomalacia at C5-6 and C3-4  . RLS (restless legs syndrome)   . Peripheral neuropathy   . 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 Constipation - functional     controlled with miralax  . RBBB   . 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 CHF (congestive heart failure)   . Pneumonia   . Restless legs syndrome 03/14/2015    Past Surgical History  Procedure Laterality Date  . Cholecystectomy    . Back surgery      6 surgeries  . Lumbar laminectomy/decompression microdiscectomy  07/14/2012    Procedure: LUMBAR LAMINECTOMY/DECOMPRESSION MICRODISCECTOMY 2 LEVELS;  Surgeon: Eustace Moore, MD;   Location: [place] [facility];  Service: Neurosurgery;  Laterality: Left;  Lumbar two three laminectomy, left thoracic four five transpedicular diskectomy    There were no vitals filed for this visit.  Visit Diagnosis:  Weakness of both legs  Lack of coordination  Abnormality of gait  Difficulty in walking      Subjective Assessment - 06/28/15 0748    Subjective No falls. Complained of soreness in mid to upper back. Pt feels that soreness could be from UE overhead reaching exercises in HEP.   Currently in Pain? Yes   Pain Score 3    Pain Location Back   Pain Orientation Upper;Mid   Pain Descriptors / Indicators Sore   Pain Onset In the past 7 days             [facility] Adult PT Treatment/Exercise - 06/27/15 0755    Ambulation/Gait   Ambulation/Gait Yes   Ambulation/Gait Assistance 4: Min assist   Ambulation/Gait Assistance Details cues for posture and left foot clearance   Ambulation Distance (Feet) 100 Feet   Assistive device Straight cane   Gait Pattern Step-through pattern;Decreased stride length;Poor foot clearance - left;Narrow base of support;Trunk flexed;Decreased hip/knee flexion - right;Decreased hip/knee flexion - left   Ambulation Surface Level;Indoor     Therapeutic Exercise  On  mat table (supervision for safety; independent with technique/ex form): -B iliopsoas stretch 2 x 2 min each side with leg off edge of mat without foot support -Hip flexion off/onto mat with knee flexion 2 x 10 each side -Supine adductor stretch 3 x 30 sec -Seated figure 4 stretch 2 x 30 sec each side  In parallel bars with back supported against bar (supervision for safety; cues for technique/ex form): -Alt overhead UE reaching for side stretch x10 -B overhead UE reaching with arching back for side stretch x10   Neuro Re-Ed (min guard; cues for posture, technique, and ex form): -On airex pad in parallel bars with UE support: double leg stance wide base of support: eyes closed 2 x 30 sec,  with head turns with eyes closed - side to side, up and down, diagonal both directions x10 each exercise           PT Short Term Goals - 06/27/15 0805    PT SHORT TERM GOAL #1   Title Independent with home exercise program to address lower extremity and core weakness and balance impairment. Target: 05/04/15   Status Achieved   PT SHORT TERM GOAL #2   Title Increase Berg Balance Test score to 38/56. Target: 05/04/15   Baseline 05/04/15- 42/56   Status Achieved   PT SHORT TERM GOAL #3   Title Decrease TUG time to 22 seconds with LRAD. Target: 05/04/15   Baseline 05/04/15- 27.34 sec's with RW   Status Not Met   PT SHORT TERM GOAL #4   Title Increase gait speed with cane to 1.35 ft/sec for increased efficiency with ambulation.   Baseline 05/04/15- 1.58 ft/sec with walker   Status Achieved   PT SHORT TERM GOAL #5   Title Ambulate 300' with cane on indoor, level surfaces with supervision without scissoring while turning for improving independence with ambulation.   Baseline Extended to long term goal   Status On-going           PT Long Term Goals - 06/27/15 0805    PT LONG TERM GOAL #1   Title Increase Berg Balance Test score to >45/56 for decreased fall risk. New target date: 08/01/15.   Baseline 06/01/15: 44/56 scored today. All unmet goals carried over to new POC starting 06/04/15.   Status On-going   PT LONG TERM GOAL #2   Title Decrease TUG time to 19 seconds with LRAD without scissoring during turn. Target: 08/01/15.   Baseline 06/01/15: 22.47 sec's with walker   Status On-going   PT LONG TERM GOAL #3   Title Increase gait speed with cane on level surfaces to >1.8 ft/sec for decreased fall risk. Target 06/03/15   Baseline 06/01/15: 2.02 ft/sec with walker, not tested with cane   Status Achieved   PT LONG TERM GOAL #4   Title Ambulate 1000' with single point cane and MOD I for increased efficiency and independence with ambulation. Target: 08/01/15.   Baseline 06/01/15: pt walking some at home with  cane inside only, where he can furniture/wall walk as well. Not up to this distance as yet.   Status On-going   PT LONG TERM GOAL #5   Title Increase score on paper copy of Ac[employer] Balance Confidence test to 55% for improved balance confidence. Target: 08/01/15.   Baseline 06/01/15: scored 53.75% today.   Status On-going   PT LONG TERM GOAL #6   Title ------------------------------   PT LONG TERM GOAL #7   Title ------------------------------   PT LONG  TERM GOAL #8   Title -----------------------------------            Plan - 06/27/15 8185    Clinical Impression Statement Continuing to improve posture and gait pattern. Independent with hip adductor stretches added to HEP last session. Challenged with static balance activities on compliant surface in parallel bars. Continuing to progress toward goals.   Pt will benefit from skilled therapeutic intervention in order to improve on the following deficits Abnormal gait;Decreased coordination;Difficulty walking;Impaired flexibility;Decreased endurance;Decreased balance;Decreased strength   Rehab Potential Good   PT Treatment/Interventions ADLs/Self Care Home Management;Therapeutic activities;Patient/family education;DME Instruction;Therapeutic exercise;Gait training;Balance training;Stair training;Neuromuscular re-education;Functional mobility training   PT Next Visit Plan hip stretching/strengthening, improved postural alignment. Increasing independence with sit <>stand; progress to gait with straight cane   Consulted and Agree with Plan of Care Patient        Problem List Patient Active Problem List   Diagnosis Date Noted  . Restless legs syndrome 03/14/2015  . DOE (dyspnea on exertion) 03/09/2015  . Sinus bradycardia 09/21/2014  . Thoracic spondylosis with myelopathy 09/13/2014  . Acute diastolic heart failure 90/93/1121  . Cervical arthritis with myelopathy 07/18/2014  . Constipation - functional   . CAP (community acquired  pneumonia) 07/10/2014  . Sepsis 07/10/2014  . HTN (hypertension) 07/10/2014  . OSA on CPAP 07/10/2014  . UTI (lower urinary tract infection) 07/10/2014  . Edema leg 07/10/2014  . Acute respiratory failure with hypoxia 07/10/2014  . 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 Other vitamin B12 deficiency anemia 07/13/2013  . 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 Polyneuropathy in other diseases classified elsewhere 02/17/2013  . Other general symptoms(780.99) 02/17/2013  . Abnormality of gait 02/17/2013  . Cervical spondylosis with myelopathy 02/17/2013   [place], [place], [place] 06/28/2015, 8:30 AM  Willow Ora, PTA, [facility] [address] [place], [place] [address] [phone] 06/28/2015, 10:14 AM

## 2015-06-29 ENCOUNTER — Encounter: Payer: Self-pay | Admitting: Physical Therapy

## 2015-06-29 ENCOUNTER — Ambulatory Visit: Payer: Medicare Other | Attending: Neurology | Admitting: Physical Therapy

## 2015-06-29 DIAGNOSIS — R279 Unspecified lack of coordination: Secondary | ICD-10-CM

## 2015-06-29 DIAGNOSIS — R269 Unspecified abnormalities of gait and mobility: Secondary | ICD-10-CM | POA: Diagnosis present

## 2015-06-29 DIAGNOSIS — R29898 Other symptoms and signs involving the musculoskeletal system: Secondary | ICD-10-CM | POA: Diagnosis present

## 2015-06-29 DIAGNOSIS — R262 Difficulty in walking, not elsewhere classified: Secondary | ICD-10-CM | POA: Insufficient documentation

## 2015-06-29 NOTE — Therapy (Signed)
[facility] [address] [place], [place], [address] Phone: [phone]   Fax:  [phone]  Physical Therapy Treatment  Patient Details  Name: Travis [name] MRN: [id number] Date of Birth: 11/13/1946 Referring Provider:  [name], MD  Encounter Date: 06/29/2015    06/29/15 1539  PT Visits / Re-Eval  Visit Number 23  Number of Visits 33  Date for PT Re-Evaluation 08/02/15  Authorization  Authorization Type Medicare-needs G codes every 10th visit  PT Time Calculation  PT Start Time 1534  PT Stop Time 1615  PT Time Calculation (min) 41 min  PT - End of Session  Equipment Utilized During Treatment Gait belt  Activity Tolerance Patient tolerated treatment well  Behavior During Therapy [facility] for tasks assessed/performed    Past Medical History  Diagnosis Date  . PONV (postoperative nausea and vomiting)   . Hypertension   . DJD (degenerative joint disease)     WHOLE SPINE  . Hyperthyroidism     Had Iodine to resolve issue DX 10 YR AGO  . Hypothyroidism     Low d/t treatment of hyperthyroidism  . Sleep apnea     DX  2009/2010 study done at [facility]  . Type II diabetes mellitus     diet controlled  . Heart murmur     Evaluated by cardiologist 4-5 years ago but does not currently see a cardiologist  . Gait disorder   . Cervical myelopathy     with myelomalacia at C5-6 and C3-4  . RLS (restless legs syndrome)   . Peripheral neuropathy   . 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 Constipation - functional     controlled with miralax  . RBBB   . 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 CHF (congestive heart failure)   . Pneumonia   . Restless legs syndrome 03/14/2015    Past Surgical History  Procedure Laterality Date  . Cholecystectomy    . Back surgery      6 surgeries  . Lumbar laminectomy/decompression microdiscectomy  07/14/2012    Procedure: LUMBAR LAMINECTOMY/DECOMPRESSION MICRODISCECTOMY 2  LEVELS;  Surgeon: Eustace Moore, MD;  Location: [place] [facility];  Service: Neurosurgery;  Laterality: Left;  Lumbar two three laminectomy, left thoracic four five transpedicular diskectomy    There were no vitals filed for this visit.  Visit Diagnosis:   Weakness of both legs   .  Lack of coordination         Subjective Assessment - 06/29/15 1539    Subjective No new complaints. No falls or pain to report.   Currently in Pain? No/denies      Treatment: Neuro Re-ed: In parallel bars: bil UE support with open palm's on bars, cues on posture and ex form and technique, 3 laps each/each way. Cues to lift feet, not slide or shuffle them High knee marching fwd/bwd, cues for high knee's Tandem walking fwd/bwd Toe walk fwd/bwd Side stepping left/right  on blue foam beam: Alternating fwd heels taps x 10 reps bil sides Alternating bwd toe taps x 10 reps bil sides        PT Short Term Goals - 06/27/15 0805    PT SHORT TERM GOAL #1   Title Independent with home exercise program to address lower extremity and core weakness and balance impairment. Target: 05/04/15   Status Achieved   PT SHORT TERM GOAL #2   Title Increase Berg Balance Test score to 38/56. Target: 05/04/15  Baseline 05/04/15- 42/56   Status Achieved   PT SHORT TERM GOAL #3   Title Decrease TUG time to 22 seconds with LRAD. Target: 05/04/15   Baseline 05/04/15- 27.34 sec's with RW   Status Not Met   PT SHORT TERM GOAL #4   Title Increase gait speed with cane to 1.35 ft/sec for increased efficiency with ambulation.   Baseline 05/04/15- 1.58 ft/sec with walker   Status Achieved   PT SHORT TERM GOAL #5   Title Ambulate 300' with cane on indoor, level surfaces with supervision without scissoring while turning for improving independence with ambulation.   Baseline Extended to long term goal   Status On-going           PT Long Term Goals - 06/27/15 0805    PT LONG TERM GOAL #1   Title Increase Berg Balance Test score to  >45/56 for decreased fall risk. New target date: 08/01/15.   Baseline 06/01/15: 44/56 scored today. All unmet goals carried over to new POC starting 06/04/15.   Status On-going   PT LONG TERM GOAL #2   Title Decrease TUG time to 19 seconds with LRAD without scissoring during turn. Target: 08/01/15.   Baseline 06/01/15: 22.47 sec's with walker   Status On-going   PT LONG TERM GOAL #3   Title Increase gait speed with cane on level surfaces to >1.8 ft/sec for decreased fall risk. Target 06/03/15   Baseline 06/01/15: 2.02 ft/sec with walker, not tested with cane   Status Achieved   PT LONG TERM GOAL #4   Title Ambulate 1000' with single point cane and MOD I for increased efficiency and independence with ambulation. Target: 08/01/15.   Baseline 06/01/15: pt walking some at home with cane inside only, where he can furniture/wall walk as well. Not up to this distance as yet.   Status On-going   PT LONG TERM GOAL #5   Title Increase score on paper copy of [employer] Balance Confidence test to 55% for improved balance confidence. Target: 08/01/15.   Baseline 06/01/15: scored 53.75% today.   Status On-going   PT LONG TERM GOAL #6   Title ------------------------------   PT LONG TERM GOAL #7   Title ------------------------------   PT LONG TERM GOAL #8   Title -----------------------------------       06/29/15 1540  Plan  Clinical Impression Statement Focued on balance and strengthening today with no issues reported. Pt making steady progress toward goals.  Pt will benefit from skilled therapeutic intervention in order to improve on the following deficits Abnormal gait;Decreased coordination;Difficulty walking;Impaired flexibility;Decreased endurance;Decreased balance;Decreased strength  Rehab Potential Good  PT Treatment/Interventions ADLs/Self Care Home Management;Therapeutic activities;Patient/family education;DME Instruction;Therapeutic exercise;Gait training;Balance training;Stair training;Neuromuscular  re-education;Functional mobility training  PT Next Visit Plan hip stretching/strengthening, improved postural alignment. Increasing independence with sit <>stand; progress to gait with straight cane  Consulted and Agree with Plan of Care Patient    Problem List Patient Active Problem List   Diagnosis Date Noted  . Restless legs syndrome 03/14/2015  . DOE (dyspnea on exertion) 03/09/2015  . Sinus bradycardia 09/21/2014  . Thoracic spondylosis with myelopathy 09/13/2014  . Acute diastolic heart failure 42/68/3419  . Cervical arthritis with myelopathy 07/18/2014  . Constipation - functional   . CAP (community acquired pneumonia) 07/10/2014  . Sepsis 07/10/2014  . HTN (hypertension) 07/10/2014  . OSA on CPAP 07/10/2014  . UTI (lower urinary tract infection) 07/10/2014  . Edema leg 07/10/2014  . Acute respiratory failure with hypoxia 07/10/2014  .  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 Other vitamin B12 deficiency anemia 07/13/2013  . 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 Polyneuropathy in other diseases classified elsewhere 02/17/2013  . Other general symptoms(780.99) 02/17/2013  . Abnormality of gait 02/17/2013  . Cervical spondylosis with myelopathy 02/17/2013    Willow Ora 06/29/2015, 3:40 PM  Willow Ora, PTA, [facility] [address] [place], [place] [address] [phone] 06/29/2015, 3:41 PM

## 2015-07-03 ENCOUNTER — Ambulatory Visit: Payer: Medicare Other | Admitting: Physical Therapy

## 2015-07-11 ENCOUNTER — Ambulatory Visit: Payer: Medicare Other | Admitting: Physical Therapy

## 2015-07-11 ENCOUNTER — Encounter: Payer: Self-pay | Admitting: Physical Therapy

## 2015-07-11 DIAGNOSIS — R29898 Other symptoms and signs involving the musculoskeletal system: Secondary | ICD-10-CM

## 2015-07-11 DIAGNOSIS — R262 Difficulty in walking, not elsewhere classified: Secondary | ICD-10-CM

## 2015-07-11 DIAGNOSIS — R269 Unspecified abnormalities of gait and mobility: Secondary | ICD-10-CM

## 2015-07-11 DIAGNOSIS — R279 Unspecified lack of coordination: Secondary | ICD-10-CM

## 2015-07-11 NOTE — Therapy (Signed)
[facility] [address] [place], [place], [address] Phone: [phone]   Fax:  [phone]  Physical Therapy Treatment  Patient Details  Name: Travis [name] MRN: [id number] Date of Birth: 08-04-1946 Referring Provider:  [name], MD  Encounter Date: 07/11/2015      PT End of Session - 07/11/15 1454    Visit Number 24   Number of Visits 33   Date for PT Re-Evaluation 08/02/15   Authorization Type Medicare-needs G codes every 10th visit   PT Start Time 1450   PT Stop Time 1530   PT Time Calculation (min) 40 min   Equipment Utilized During Treatment Gait belt   Activity Tolerance Patient tolerated treatment well   Behavior During Therapy [facility] for tasks assessed/performed      Past Medical History  Diagnosis Date  . PONV (postoperative nausea and vomiting)   . Hypertension   . DJD (degenerative joint disease)     WHOLE SPINE  . Hyperthyroidism     Had Iodine to resolve issue DX 10 YR AGO  . Hypothyroidism     Low d/t treatment of hyperthyroidism  . Sleep apnea     DX  2009/2010 study done at [facility]  . Type II diabetes mellitus     diet controlled  . Heart murmur     Evaluated by cardiologist 4-5 years ago but does not currently see a cardiologist  . Gait disorder   . Cervical myelopathy     with myelomalacia at C5-6 and C3-4  . RLS (restless legs syndrome)   . Peripheral neuropathy   . 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 Constipation - functional     controlled with miralax  . RBBB   . 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 CHF (congestive heart failure)   . Pneumonia   . Restless legs syndrome 03/14/2015    Past Surgical History  Procedure Laterality Date  . Cholecystectomy    . Back surgery      6 surgeries  . Lumbar laminectomy/decompression microdiscectomy  07/14/2012    Procedure: LUMBAR LAMINECTOMY/DECOMPRESSION MICRODISCECTOMY 2 LEVELS;  Surgeon: Eustace Moore, MD;   Location: [place] [facility];  Service: Neurosurgery;  Laterality: Left;  Lumbar two three laminectomy, left thoracic four five transpedicular diskectomy    There were no vitals filed for this visit.  Visit Diagnosis:  Weakness of both legs  Lack of coordination  Abnormality of gait  Difficulty in walking      Subjective Assessment - 07/11/15 1454    Currently in Pain? No/denies   Pain Score 0-No pain     Treatment: Neuro Re-ed: In parallel bars: bil UE support with open palm's on bars, cues on posture and ex form and technique, 3 laps each/each way. Cues to lift feet, not slide or shuffle them High knee marching fwd/bwd, cues for high knee's Tandem walking fwd/bwd Toe walk fwd/bwd Heel walk fwd/bwd  Exercises In parallel bars Forward bend at hips for bil hamstring stretching, arms sliding along bars, 15 sec hold x 3 reps Forward step ups 2 sets of 5 reps each leg, using 6 inch wooden step with bil UE support on bars Lateral step ups x 10 each side with 6 inch wooden step and bil UE support on bars.         PT Short Term Goals - 06/27/15 0805    PT SHORT TERM GOAL #1   Title Independent with home exercise  program to address lower extremity and core weakness and balance impairment. Target: 05/04/15   Status Achieved   PT SHORT TERM GOAL #2   Title Increase Berg Balance Test score to 38/56. Target: 05/04/15   Baseline 05/04/15- 42/56   Status Achieved   PT SHORT TERM GOAL #3   Title Decrease TUG time to 22 seconds with LRAD. Target: 05/04/15   Baseline 05/04/15- 27.34 sec's with RW   Status Not Met   PT SHORT TERM GOAL #4   Title Increase gait speed with cane to 1.35 ft/sec for increased efficiency with ambulation.   Baseline 05/04/15- 1.58 ft/sec with walker   Status Achieved   PT SHORT TERM GOAL #5   Title Ambulate 300' with cane on indoor, level surfaces with supervision without scissoring while turning for improving independence with ambulation.   Baseline Extended to long  term goal   Status On-going           PT Long Term Goals - 06/27/15 0805    PT LONG TERM GOAL #1   Title Increase Berg Balance Test score to >45/56 for decreased fall risk. New target date: 08/01/15.   Baseline 06/01/15: 44/56 scored today. All unmet goals carried over to new POC starting 06/04/15.   Status On-going   PT LONG TERM GOAL #2   Title Decrease TUG time to 19 seconds with LRAD without scissoring during turn. Target: 08/01/15.   Baseline 06/01/15: 22.47 sec's with walker   Status On-going   PT LONG TERM GOAL #3   Title Increase gait speed with cane on level surfaces to >1.8 ft/sec for decreased fall risk. Target 06/03/15   Baseline 06/01/15: 2.02 ft/sec with walker, not tested with cane   Status Achieved   PT LONG TERM GOAL #4   Title Ambulate 1000' with single point cane and MOD I for increased efficiency and independence with ambulation. Target: 08/01/15.   Baseline 06/01/15: pt walking some at home with cane inside only, where he can furniture/wall walk as well. Not up to this distance as yet.   Status On-going   PT LONG TERM GOAL #5   Title Increase score on paper copy of Acitivities Balance Confidence test to 55% for improved balance confidence. Target: 08/01/15.   Baseline 06/01/15: scored 53.75% today.   Status On-going   PT LONG TERM GOAL #6   Title ------------------------------   PT LONG TERM GOAL #7   Title ------------------------------   PT LONG TERM GOAL #8   Title -----------------------------------           Plan - 07/11/15 1454    Clinical Impression Statement Continued to focus on lower extremity strengthening and balance today without issues reported. Pt progressing toward goals.   Pt will benefit from skilled therapeutic intervention in order to improve on the following deficits Abnormal gait;Decreased coordination;Difficulty walking;Impaired flexibility;Decreased endurance;Decreased balance;Decreased strength   Rehab Potential Good   PT Treatment/Interventions  ADLs/Self Care Home Management;Therapeutic activities;Patient/family education;DME Instruction;Therapeutic exercise;Gait training;Balance training;Stair training;Neuromuscular re-education;Functional mobility training   PT Next Visit Plan hip stretching/strengthening, improved postural alignment. Increasing independence with sit <>stand; gait with straight cane   Consulted and Agree with Plan of Care Patient        Problem List Patient Active Problem List   Diagnosis Date Noted  . Restless legs syndrome 03/14/2015  . DOE (dyspnea on exertion) 03/09/2015  . Sinus bradycardia 09/21/2014  . Thoracic spondylosis with myelopathy 09/13/2014  . Acute diastolic heart failure 33/35/4562  . Cervical arthritis with  myelopathy 07/18/2014  . Constipation - functional   . CAP (community acquired pneumonia) 07/10/2014  . Sepsis 07/10/2014  . HTN (hypertension) 07/10/2014  . OSA on CPAP 07/10/2014  . UTI (lower urinary tract infection) 07/10/2014  . Edema leg 07/10/2014  . Acute respiratory failure with hypoxia 07/10/2014  . 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 Other vitamin B12 deficiency anemia 07/13/2013  . 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 Polyneuropathy in other diseases classified elsewhere 02/17/2013  . Other general symptoms(780.99) 02/17/2013  . Abnormality of gait 02/17/2013  . Cervical spondylosis with myelopathy 02/17/2013    Willow Ora 07/12/2015, 10:11 AM  Willow Ora, PTA, [facility] [address] [place], [place] [address] [phone] 07/12/2015, 10:12 AM

## 2015-07-13 ENCOUNTER — Ambulatory Visit: Payer: Medicare Other | Admitting: Physical Therapy

## 2015-07-16 ENCOUNTER — Ambulatory Visit: Payer: Medicare Other | Admitting: Physical Medicine & Rehabilitation

## 2015-07-16 ENCOUNTER — Encounter: Payer: Medicare Other | Admitting: Physical Medicine & Rehabilitation

## 2015-07-17 ENCOUNTER — Ambulatory Visit: Payer: Medicare Other | Admitting: Physical Therapy

## 2015-07-18 ENCOUNTER — Ambulatory Visit: Payer: Medicare Other | Admitting: Physical Therapy

## 2015-07-18 ENCOUNTER — Encounter: Payer: Self-pay | Admitting: Physical Therapy

## 2015-07-18 DIAGNOSIS — R269 Unspecified abnormalities of gait and mobility: Secondary | ICD-10-CM

## 2015-07-18 DIAGNOSIS — R29898 Other symptoms and signs involving the musculoskeletal system: Secondary | ICD-10-CM | POA: Diagnosis not present

## 2015-07-18 DIAGNOSIS — R262 Difficulty in walking, not elsewhere classified: Secondary | ICD-10-CM

## 2015-07-18 DIAGNOSIS — R279 Unspecified lack of coordination: Secondary | ICD-10-CM

## 2015-07-18 NOTE — Therapy (Signed)
[facility] [address] [place], [place], [address] Phone: [phone]   Fax:  [phone]  Physical Therapy Treatment  Patient Details  Name: Travis [name] MRN: [id number] Date of Birth: August 04, 1946 Referring Provider:  [name], MD  Encounter Date: 07/18/2015      PT End of Session - 07/18/15 1453    Visit Number 25   Number of Visits 33   Date for PT Re-Evaluation 08/02/15   Authorization Type Medicare-needs G codes every 10th visit   PT Start Time 1448   PT Stop Time 1530   PT Time Calculation (min) 42 min   Equipment Utilized During Treatment Gait belt   Activity Tolerance Patient tolerated treatment well   Behavior During Therapy [facility] for tasks assessed/performed      Past Medical History  Diagnosis Date  . PONV (postoperative nausea and vomiting)   . Hypertension   . DJD (degenerative joint disease)     WHOLE SPINE  . Hyperthyroidism     Had Iodine to resolve issue DX 10 YR AGO  . Hypothyroidism     Low d/t treatment of hyperthyroidism  . Sleep apnea     DX  2009/2010 study done at [facility]  . Type II diabetes mellitus     diet controlled  . Heart murmur     Evaluated by cardiologist 4-5 years ago but does not currently see a cardiologist  . Gait disorder   . Cervical myelopathy     with myelomalacia at C5-6 and C3-4  . RLS (restless legs syndrome)   . Peripheral neuropathy   . 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 Constipation - functional     controlled with miralax  . RBBB   . 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 CHF (congestive heart failure)   . Pneumonia   . Restless legs syndrome 03/14/2015    Past Surgical History  Procedure Laterality Date  . Cholecystectomy    . Back surgery      6 surgeries  . Lumbar laminectomy/decompression microdiscectomy  07/14/2012    Procedure: LUMBAR LAMINECTOMY/DECOMPRESSION MICRODISCECTOMY 2 LEVELS;  Surgeon: Eustace Moore, MD;   Location: [place] [facility];  Service: Neurosurgery;  Laterality: Left;  Lumbar two three laminectomy, left thoracic four five transpedicular diskectomy    There were no vitals filed for this visit.  Visit Diagnosis:  Weakness of both legs  Lack of coordination  Abnormality of gait  Difficulty in walking      Subjective Assessment - 07/18/15 1454    Subjective No new complaints. No falls or pain to report.   Currently in Pain? No/denies            [facility] Adult PT Treatment/Exercise - 07/18/15 1455    Ambulation/Gait   Ambulation/Gait Yes   Ambulation/Gait Assistance 4: Min assist   Ambulation/Gait Assistance Details cues for posture and L foot clearance   Ambulation Distance (Feet) 115 Feet   Assistive device Straight cane   Gait Pattern Step-through pattern;Decreased stride length;Poor foot clearance - left;Narrow base of support;Trunk flexed;Decreased hip/knee flexion - right;Decreased hip/knee flexion - left   Ambulation Surface Level;Indoor     Neuro Re-Ed (min guard for safety; cues for sequencing, posture, and technique): -In parallel bars on level surface with B palm support: Marching forward/backward x3 laps, tandem walking forward/backward x3 laps -In parallel bars on red mat with B palm support: stepping over hurdles x3 laps  -In parallel bars on  foam beam: DLS 4B63 sec with no UE support (increased ant/post sway; min assist for balance); forward toe taps x20 with B palm support, x10 with slight UE support        PT Short Term Goals - 06/27/15 0805    PT SHORT TERM GOAL #1   Title Independent with home exercise program to address lower extremity and core weakness and balance impairment. Target: 05/04/15   Status Achieved   PT SHORT TERM GOAL #2   Title Increase Berg Balance Test score to 38/56. Target: 05/04/15   Baseline 05/04/15- 42/56   Status Achieved   PT SHORT TERM GOAL #3   Title Decrease TUG time to 22 seconds with LRAD. Target: 05/04/15   Baseline 05/04/15-  27.34 sec's with RW   Status Not Met   PT SHORT TERM GOAL #4   Title Increase gait speed with cane to 1.35 ft/sec for increased efficiency with ambulation.   Baseline 05/04/15- 1.58 ft/sec with walker   Status Achieved   PT SHORT TERM GOAL #5   Title Ambulate 300' with cane on indoor, level surfaces with supervision without scissoring while turning for improving independence with ambulation.   Baseline Extended to long term goal   Status On-going           PT Long Term Goals - 06/27/15 0805    PT LONG TERM GOAL #1   Title Increase Berg Balance Test score to >45/56 for decreased fall risk. New target date: 08/01/15.   Baseline 06/01/15: 44/56 scored today. All unmet goals carried over to new POC starting 06/04/15.   Status On-going   PT LONG TERM GOAL #2   Title Decrease TUG time to 19 seconds with LRAD without scissoring during turn. Target: 08/01/15.   Baseline 06/01/15: 22.47 sec's with walker   Status On-going   PT LONG TERM GOAL #3   Title Increase gait speed with cane on level surfaces to >1.8 ft/sec for decreased fall risk. Target 06/03/15   Baseline 06/01/15: 2.02 ft/sec with walker, not tested with cane   Status Achieved   PT LONG TERM GOAL #4   Title Ambulate 1000' with single point cane and MOD I for increased efficiency and independence with ambulation. Target: 08/01/15.   Baseline 06/01/15: pt walking some at home with cane inside only, where he can furniture/wall walk as well. Not up to this distance as yet.   Status On-going   PT LONG TERM GOAL #5   Title Increase score on paper copy of [employer] Balance Confidence test to 55% for improved balance confidence. Target: 08/01/15.   Baseline 06/01/15: scored 53.75% today.   Status On-going   PT LONG TERM GOAL #6   Title ------------------------------   PT LONG TERM GOAL #7   Title ------------------------------   PT LONG TERM GOAL #8   Title -----------------------------------            Plan - 07/18/15 1634    Clinical  Impression Statement Pt continues to be challenged with dynamic balance activities. Focused on hip/knee flexion with marching and stepping over obstacles since pt continues to demonstrate difficulty with L foot clearance during gait. Making steady progress toward goals.   Pt will benefit from skilled therapeutic intervention in order to improve on the following deficits Abnormal gait;Decreased coordination;Difficulty walking;Impaired flexibility;Decreased endurance;Decreased balance;Decreased strength   Rehab Potential Good   PT Frequency 2x / week   PT Duration 8 weeks   PT Treatment/Interventions ADLs/Self Care Home Management;Therapeutic activities;Patient/family education;DME Instruction;Therapeutic exercise;Gait  training;Balance training;Stair training;Neuromuscular re-education;Functional mobility training   PT Next Visit Plan hip stretching/strengthening, improved postural alignment. Increasing independence with sit <>stand; gait with straight cane   Consulted and Agree with Plan of Care Patient        Problem List Patient Active Problem List   Diagnosis Date Noted  . Restless legs syndrome 03/14/2015  . DOE (dyspnea on exertion) 03/09/2015  . Sinus bradycardia 09/21/2014  . Thoracic spondylosis with myelopathy 09/13/2014  . Acute diastolic heart failure 01/58/6825  . Cervical arthritis with myelopathy 07/18/2014  . Constipation - functional   . CAP (community acquired pneumonia) 07/10/2014  . Sepsis 07/10/2014  . HTN (hypertension) 07/10/2014  . OSA on CPAP 07/10/2014  . UTI (lower urinary tract infection) 07/10/2014  . Edema leg 07/10/2014  . Acute respiratory failure with hypoxia 07/10/2014  . 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 Other vitamin B12 deficiency anemia 07/13/2013  . 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 Polyneuropathy in other diseases classified elsewhere 02/17/2013  . Other general symptoms(780.99) 02/17/2013  . Abnormality  of gait 02/17/2013  . Cervical spondylosis with myelopathy 02/17/2013    Rubye Oaks 07/19/2015, 7:56 AM  Rubye Oaks, [facility] [address] [place], [place], [address] Phone: [phone]   Fax:  [phone]

## 2015-07-20 ENCOUNTER — Encounter: Payer: Self-pay | Admitting: Physical Therapy

## 2015-07-20 ENCOUNTER — Ambulatory Visit: Payer: Medicare Other | Admitting: Physical Therapy

## 2015-07-20 DIAGNOSIS — R279 Unspecified lack of coordination: Secondary | ICD-10-CM

## 2015-07-20 DIAGNOSIS — R29898 Other symptoms and signs involving the musculoskeletal system: Secondary | ICD-10-CM

## 2015-07-20 DIAGNOSIS — R262 Difficulty in walking, not elsewhere classified: Secondary | ICD-10-CM

## 2015-07-20 DIAGNOSIS — R269 Unspecified abnormalities of gait and mobility: Secondary | ICD-10-CM

## 2015-07-20 NOTE — Therapy (Signed)
[facility] [address] [place], [place], [address] Phone: [phone]   Fax:  [phone]  Physical Therapy Treatment  Patient Details  Name: Travis [name] MRN: [id number] Date of Birth: [birth year] Referring Provider:  [name], MD  Encounter Date: 07/20/2015      PT End of Session - 07/20/15 1540    Visit Number 26   Number of Visits 33   Date for PT Re-Evaluation 08/02/15   Authorization Type Medicare-needs G codes every 10th visit   PT Start Time 1535   PT Stop Time 1615   PT Time Calculation (min) 40 min   Equipment Utilized During Treatment Gait belt   Activity Tolerance Patient tolerated treatment well   Behavior During Therapy [facility] for tasks assessed/performed      Past Medical History  Diagnosis Date  . PONV (postoperative nausea and vomiting)   . Hypertension   . DJD (degenerative joint disease)     WHOLE SPINE  . Hyperthyroidism     Had Iodine to resolve issue DX 10 YR AGO  . Hypothyroidism     Low d/t treatment of hyperthyroidism  . Sleep apnea     DX  2009/2010 study done at [facility]  . Type II diabetes mellitus     diet controlled  . Heart murmur     Evaluated by cardiologist 4-5 years ago but does not currently see a cardiologist  . Gait disorder   . Cervical myelopathy     with myelomalacia at C5-6 and C3-4  . RLS (restless legs syndrome)   . Peripheral neuropathy   . 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 Constipation - functional     controlled with miralax  . RBBB   . 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 CHF (congestive heart failure)   . Pneumonia   . Restless legs syndrome 03/14/2015    Past Surgical History  Procedure Laterality Date  . Cholecystectomy    . Back surgery      6 surgeries  . Lumbar laminectomy/decompression microdiscectomy  07/14/2012    Procedure: LUMBAR LAMINECTOMY/DECOMPRESSION MICRODISCECTOMY 2 LEVELS;  Surgeon: Eustace Moore, MD;   Location: [place] [facility];  Service: Neurosurgery;  Laterality: Left;  Lumbar two three laminectomy, left thoracic four five transpedicular diskectomy    There were no vitals filed for this visit.  Visit Diagnosis:  Weakness of both legs  Lack of coordination  Abnormality of gait  Difficulty in walking      Subjective Assessment - 07/20/15 1540    Subjective No new complaints. No falls or pain to report.   Currently in Pain? No/denies   Pain Score 0-No pain        Treatment: Neuro re ed: in parallel bars With light UE support on bars (open palm) High knee marches fwd/bwd x 3 laps each way Tandem gait fwd/bwd x 3 laps each way  On blue foam beam: light UE assist on bars Side stepping x 3 laps toward each side With feet across foam beam: Alternating fwd foot taps x 10 each and alternating bwd toe taps x 10 each        [facility] Adult PT Treatment/Exercise - 07/21/15 1255    Ambulation/Gait   Ambulation/Gait Yes   Ambulation/Gait Assistance 4: Min assist   Ambulation Distance (Feet) 135 Feet   Assistive device Straight cane   Gait Pattern Step-through pattern;Decreased stride length;Poor foot clearance - left;Narrow base of support;Trunk flexed;Decreased hip/knee  flexion - right;Decreased hip/knee flexion - left           PT Short Term Goals - 06/27/15 0805    PT SHORT TERM GOAL #1   Title Independent with home exercise program to address lower extremity and core weakness and balance impairment. Target: 05/04/15   Status Achieved   PT SHORT TERM GOAL #2   Title Increase Berg Balance Test score to 38/56. Target: 05/04/15   Baseline 05/04/15- 42/56   Status Achieved   PT SHORT TERM GOAL #3   Title Decrease TUG time to 22 seconds with LRAD. Target: 05/04/15   Baseline 05/04/15- 27.34 sec's with RW   Status Not Met   PT SHORT TERM GOAL #4   Title Increase gait speed with cane to 1.35 ft/sec for increased efficiency with ambulation.   Baseline 05/04/15- 1.58 ft/sec with walker    Status Achieved   PT SHORT TERM GOAL #5   Title Ambulate 300' with cane on indoor, level surfaces with supervision without scissoring while turning for improving independence with ambulation.   Baseline Extended to long term goal   Status On-going           PT Long Term Goals - 06/27/15 0805    PT LONG TERM GOAL #1   Title Increase Berg Balance Test score to >45/56 for decreased fall risk. New target date: 08/01/15.   Baseline 06/01/15: 44/56 scored today. All unmet goals carried over to new POC starting 06/04/15.   Status On-going   PT LONG TERM GOAL #2   Title Decrease TUG time to 19 seconds with LRAD without scissoring during turn. Target: 08/01/15.   Baseline 06/01/15: 22.47 sec's with walker   Status On-going   PT LONG TERM GOAL #3   Title Increase gait speed with cane on level surfaces to >1.8 ft/sec for decreased fall risk. Target 06/03/15   Baseline 06/01/15: 2.02 ft/sec with walker, not tested with cane   Status Achieved   PT LONG TERM GOAL #4   Title Ambulate 1000' with single point cane and MOD I for increased efficiency and independence with ambulation. Target: 08/01/15.   Baseline 06/01/15: pt walking some at home with cane inside only, where he can furniture/wall walk as well. Not up to this distance as yet.   Status On-going   PT LONG TERM GOAL #5   Title Increase score on paper copy of Ac[employer]ivities Balance Confidence test to 55% for improved balance confidence. Target: 08/01/15.   Baseline 06/01/15: scored 53.75% today.   Status On-going   PT LONG TERM GOAL #6   Title ------------------------------   PT LONG TERM GOAL #7   Title ------------------------------   PT LONG TERM GOAL #8   Title -----------------------------------           Plan - 07/20/15 1541    Clinical Impression Statement Pt continues to need assist with cues for foot clearnce with gait, no loss of balance noted. Still challenged with higher level balance activities. Pt making slow, steady progress toward  goals. Progress and plan of care discussed today as pt even feels he is starting to platueau in progress. Discussed possibility of taking a break from therapy for pt to work on strength and endurance on his own. Then after notable improvment, returning to therapy is he needs to a that time. Pt to think about this. Primary PT notified and in agreement as well.             Pt will benefit from  skilled therapeutic intervention in order to improve on the following deficits Abnormal gait;Decreased coordination;Difficulty walking;Impaired flexibility;Decreased endurance;Decreased balance;Decreased strength   Rehab Potential Good   PT Frequency 2x / week   PT Duration 8 weeks   PT Treatment/Interventions ADLs/Self Care Home Management;Therapeutic activities;Patient/family education;DME Instruction;Therapeutic exercise;Gait training;Balance training;Stair training;Neuromuscular re-education;Functional mobility training   PT Next Visit Plan hip stretching/strengthening, improved postural alignment. Increasing independence with sit <>stand; gait with straight cane   Consulted and Agree with Plan of Care Patient        Problem List Patient Active Problem List   Diagnosis Date Noted  . Restless legs syndrome 03/14/2015  . DOE (dyspnea on exertion) 03/09/2015  . Sinus bradycardia 09/21/2014  . Thoracic spondylosis with myelopathy 09/13/2014  . Acute diastolic heart failure 54/65/0354  . Cervical arthritis with myelopathy 07/18/2014  . Constipation - functional   . CAP (community acquired pneumonia) 07/10/2014  . Sepsis 07/10/2014  . HTN (hypertension) 07/10/2014  . OSA on CPAP 07/10/2014  . UTI (lower urinary tract infection) 07/10/2014  . Edema leg 07/10/2014  . Acute respiratory failure with hypoxia 07/10/2014  . 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 Other vitamin B12 deficiency anemia 07/13/2013  . 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 Polyneuropathy in other  diseases classified elsewhere 02/17/2013  . Other general symptoms(780.99) 02/17/2013  . Abnormality of gait 02/17/2013  . Cervical spondylosis with myelopathy 02/17/2013    Willow Ora 07/21/2015, 12:55 PM  Willow Ora, PTA, [facility] [address], [place], [place] [address] [phone] 07/21/2015, 12:55 PM

## 2015-07-24 ENCOUNTER — Ambulatory Visit: Payer: Medicare Other

## 2015-07-24 DIAGNOSIS — R269 Unspecified abnormalities of gait and mobility: Secondary | ICD-10-CM

## 2015-07-24 DIAGNOSIS — R29898 Other symptoms and signs involving the musculoskeletal system: Secondary | ICD-10-CM | POA: Diagnosis not present

## 2015-07-24 NOTE — Therapy (Signed)
[facility] [address] [place], [place], [address] Phone: [phone]   Fax:  [phone]  Physical Therapy Treatment  Patient Details  Name: Travis [name] MRN: [id number] Date of Birth: 1946/03/13 Referring Provider:  [name], MD  Encounter Date: 07/24/2015      PT End of Session - 07/24/15 1618    Visit Number 27   Number of Visits 33   Date for PT Re-Evaluation 08/02/15   Authorization Type Medicare-needs G codes every 10th visit   PT Start Time 1532   PT Stop Time 1613   PT Time Calculation (min) 41 min   Equipment Utilized During Treatment Gait belt   Activity Tolerance Patient limited by pain   Behavior During Therapy [facility] for tasks assessed/performed      Past Medical History  Diagnosis Date  . PONV (postoperative nausea and vomiting)   . Hypertension   . DJD (degenerative joint disease)     WHOLE SPINE  . Hyperthyroidism     Had Iodine to resolve issue DX 10 YR AGO  . Hypothyroidism     Low d/t treatment of hyperthyroidism  . Sleep apnea     DX  2009/2010 study done at [facility]  . Type II diabetes mellitus     diet controlled  . Heart murmur     Evaluated by cardiologist 4-5 years ago but does not currently see a cardiologist  . Gait disorder   . Cervical myelopathy     with myelomalacia at C5-6 and C3-4  . RLS (restless legs syndrome)   . Peripheral neuropathy   . 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 Constipation - functional     controlled with miralax  . RBBB   . 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 CHF (congestive heart failure)   . Pneumonia   . Restless legs syndrome 03/14/2015    Past Surgical History  Procedure Laterality Date  . Cholecystectomy    . Back surgery      6 surgeries  . Lumbar laminectomy/decompression microdiscectomy  07/14/2012    Procedure: LUMBAR LAMINECTOMY/DECOMPRESSION MICRODISCECTOMY 2 LEVELS;  Surgeon: Eustace Moore, MD;  Location: [facility];  Service: Neurosurgery;  Laterality: Left;  Lumbar two three laminectomy, left thoracic four five transpedicular diskectomy    There were no vitals filed for this visit.  Visit Diagnosis:  Abnormality of gait      Subjective Assessment - 07/24/15 1538    Subjective Pt denied falls since last visit. Pt reported L shin cramping sensation when he moves his toes towards his nose (dorsiflexion).    Patient Stated Goals Pt's goal for therapy is to ambulate without the walker.    Currently in Pain? Yes   Pain Score 2    Pain Location Leg   Pain Orientation Left   Pain Descriptors / Indicators Sore   Pain Type Acute pain   Pain Onset Today   Pain Frequency Intermittent   Aggravating Factors  dorsiflexion   Pain Relieving Factors rest                         [facility] Adult PT Treatment/Exercise - 07/24/15 1543    Ambulation/Gait   Ambulation/Gait --   Standardized Balance Assessment   Standardized Balance Assessment Berg Balance Test;Timed Up and Go Test   Berg Balance Test   Sit to Stand Able to stand  independently using hands  Standing Unsupported Able to stand safely 2 minutes   Sitting with Back Unsupported but Feet Supported on Floor or Stool Able to sit safely and securely 2 minutes   Stand to Sit Sits safely with minimal use of hands   Transfers Able to transfer safely, minor use of hands   Standing Unsupported with Eyes Closed Able to stand 10 seconds safely   Standing Ubsupported with Feet Together Able to place feet together independently and stand 1 minute safely   From Standing, Reach Forward with Outstretched Arm Can reach forward >12 cm safely (5")   From Standing Position, Pick up Object from Floor Unable to pick up shoe, but reaches 2-5 cm (1-2") from shoe and balances independently   From Standing Position, Turn to Look Behind Over each Shoulder Looks behind from both sides and weight shifts well   Turn 360 Degrees Able to turn 360 degrees  safely but slowly   Standing Unsupported, Alternately Place Feet on Step/Stool Needs assistance to keep from falling or unable to try   Standing Unsupported, One Foot in Front Able to plae foot ahead of the other independently and hold 30 seconds   Standing on One Leg Tries to lift leg/unable to hold 3 seconds but remains standing independently   Total Score 42   Timed Up and Go Test   TUG Normal TUG   Normal TUG (seconds) 27.8  with RW                PT Education - 07/24/15 1617    Education provided Yes   Education Details PT discussed d/c on Friday, with the understanding that pt continue current HEP and that he can come back to PT if he has significant return of LE function.   Person(s) Educated Patient   Methods Explanation   Comprehension Verbalized understanding          PT Short Term Goals - 06/27/15 0805    PT SHORT TERM GOAL #1   Title Independent with home exercise program to address lower extremity and core weakness and balance impairment. Target: 05/04/15   Status Achieved   PT SHORT TERM GOAL #2   Title Increase Berg Balance Test score to 38/56. Target: 05/04/15   Baseline 05/04/15- 42/56   Status Achieved   PT SHORT TERM GOAL #3   Title Decrease TUG time to 22 seconds with LRAD. Target: 05/04/15   Baseline 05/04/15- 27.34 sec's with RW   Status Not Met   PT SHORT TERM GOAL #4   Title Increase gait speed with cane to 1.35 ft/sec for increased efficiency with ambulation.   Baseline 05/04/15- 1.58 ft/sec with walker   Status Achieved   PT SHORT TERM GOAL #5   Title Ambulate 300' with cane on indoor, level surfaces with supervision without scissoring while turning for improving independence with ambulation.   Baseline Extended to long term goal   Status On-going           PT Long Term Goals - 07/24/15 1620    PT LONG TERM GOAL #1   Title Increase Berg Balance Test score to >45/56 for decreased fall risk. New target date: 08/01/15.   Baseline Met on 06/20/15 but  scored 42/56 on 07/24/15 likely due to L LE pain inhibiting progress.   Status Achieved   PT LONG TERM GOAL #2   Title Decrease TUG time to 19 seconds with LRAD without scissoring during turn. Target: 08/01/15.   Baseline 06/01/15: 22.47 sec's with walker  Status Not Met   PT LONG TERM GOAL #3   Title Increase gait speed with cane on level surfaces to >1.8 ft/sec for decreased fall risk. Target 06/03/15   Baseline 06/01/15: 2.02 ft/sec with walker, not tested with cane   Status Achieved   PT LONG TERM GOAL #4   Title Ambulate 1000' with single point cane and MOD I for increased efficiency and independence with ambulation. Target: 08/01/15.   Baseline 06/01/15: pt walking some at home with cane inside only, where he can furniture/wall walk as well. Not up to this distance as yet.   Status On-going   PT LONG TERM GOAL #5   Title Increase score on paper copy of [employer] Balance Confidence test to 55% for improved balance confidence. Target: 08/01/15.   Baseline 06/01/15: scored 53.75% today.   Status On-going   PT LONG TERM GOAL #6   Title ------------------------------   PT LONG TERM GOAL #7   Title ------------------------------   PT LONG TERM GOAL #8   Title -----------------------------------               Plan - 07/24/15 1619    Clinical Impression Statement Pt limited today by L LE pain, as he required increased time and seated rest breaks during session. Pt's BERG and TUG score decreased since last month, this is most likely due to L LE pain. PT will assess remaining goals next session and d/c pt. Pt agreeable.   Pt will benefit from skilled therapeutic intervention in order to improve on the following deficits Abnormal gait;Decreased coordination;Difficulty walking;Impaired flexibility;Decreased endurance;Decreased balance;Decreased strength   Rehab Potential Good   PT Frequency 2x / week   PT Duration 8 weeks   PT Treatment/Interventions ADLs/Self Care Home Management;Therapeutic  activities;Patient/family education;DME Instruction;Therapeutic exercise;Gait training;Balance training;Stair training;Neuromuscular re-education;Functional mobility training   PT Next Visit Plan G-code. Assess remaining goals and d/c   Consulted and Agree with Plan of Care Patient        Problem List Patient Active Problem List   Diagnosis Date Noted  . Restless legs syndrome 03/14/2015  . DOE (dyspnea on exertion) 03/09/2015  . Sinus bradycardia 09/21/2014  . Thoracic spondylosis with myelopathy 09/13/2014  . Acute diastolic heart failure 89/38/1017  . Cervical arthritis with myelopathy 07/18/2014  . Constipation - functional   . CAP (community acquired pneumonia) 07/10/2014  . Sepsis 07/10/2014  . HTN (hypertension) 07/10/2014  . OSA on CPAP 07/10/2014  . UTI (lower urinary tract infection) 07/10/2014  . Edema leg 07/10/2014  . Acute respiratory failure with hypoxia 07/10/2014  . 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 Other vitamin B12 deficiency anemia 07/13/2013  . 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 Polyneuropathy in other diseases classified elsewhere 02/17/2013  . Other general symptoms(780.99) 02/17/2013  . Abnormality of gait 02/17/2013  . Cervical spondylosis with myelopathy 02/17/2013    Lailah Marcelli L 07/24/2015, 4:22 PM  [facility] [address] [place], [place], [address] Phone: [phone]   Fax:  [phone]     Geoffry Paradise, PT,DPT 07/24/2015 4:23 PM Phone: [phone] Fax: [phone]

## 2015-07-27 ENCOUNTER — Ambulatory Visit: Payer: Medicare Other

## 2015-07-27 DIAGNOSIS — R29898 Other symptoms and signs involving the musculoskeletal system: Secondary | ICD-10-CM | POA: Diagnosis not present

## 2015-07-27 DIAGNOSIS — R269 Unspecified abnormalities of gait and mobility: Secondary | ICD-10-CM

## 2015-07-27 NOTE — Therapy (Addendum)
[facility] [address] [place], [place], [address] Phone: [phone]   Fax:  [phone]  Physical Therapy Treatment  Patient Details  Name: Travis [name] MRN: [id number] Date of Birth: Feb 12, 1946 Referring Provider:  [name], MD  Encounter Date: 07/27/2015      PT End of Session - 07/27/15 1549    Visit Number 28   Number of Visits 33   Date for PT Re-Evaluation 08/02/15   Authorization Type Medicare-needs G codes every 10th visit   PT Start Time 1533   Equipment Utilized During Treatment Gait belt   Activity Tolerance Patient tolerated treatment well   Behavior During Therapy [facility] for tasks assessed/performed      Past Medical History  Diagnosis Date  . PONV (postoperative nausea and vomiting)   . Hypertension   . DJD (degenerative joint disease)     WHOLE SPINE  . Hyperthyroidism     Had Iodine to resolve issue DX 10 YR AGO  . Hypothyroidism     Low d/t treatment of hyperthyroidism  . Sleep apnea     DX  2009/2010 study done at [facility]  . Type II diabetes mellitus     diet controlled  . Heart murmur     Evaluated by cardiologist 4-5 years ago but does not currently see a cardiologist  . Gait disorder   . Cervical myelopathy     with myelomalacia at C5-6 and C3-4  . RLS (restless legs syndrome)   . Peripheral neuropathy   . 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 Constipation - functional     controlled with miralax  . RBBB   . 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 CHF (congestive heart failure)   . Pneumonia   . Restless legs syndrome 03/14/2015    Past Surgical History  Procedure Laterality Date  . Cholecystectomy    . Back surgery      6 surgeries  . Lumbar laminectomy/decompression microdiscectomy  07/14/2012    Procedure: LUMBAR LAMINECTOMY/DECOMPRESSION MICRODISCECTOMY 2 LEVELS;  Surgeon: Eustace Moore, MD;  Location: [facility];  Service: Neurosurgery;  Laterality:  Left;  Lumbar two three laminectomy, left thoracic four five transpedicular diskectomy    There were no vitals filed for this visit.  Visit Diagnosis:  Abnormality of gait      Subjective Assessment - 07/27/15 1535    Subjective Pt denied falls or changes since last visit. Pt reported his L shin is feeling better, the ice helped.    Patient Stated Goals Pt's goal for therapy is to ambulate without the walker.    Currently in Pain? No/denies                         [facility] Adult PT Treatment/Exercise - 07/27/15 0001    Ambulation/Gait   Ambulation/Gait Yes   Ambulation/Gait Assistance 4: Min guard   Ambulation/Gait Assistance Details Cues for upright posture and to decrease narrow BOS.  Pt noted to experience decreased speed during last 40' of amb. 2/2 fatigue.   Ambulation Distance (Feet) 117 Feet   Assistive device Straight cane   Gait Pattern Step-through pattern;Decreased stride length;Poor foot clearance - left;Narrow base of support;Trunk flexed;Decreased hip/knee flexion - right;Decreased hip/knee flexion - left   Ambulation Surface Level;Indoor      ABC scale: 46.25%.          PT Education - 07/27/15 1548    Education  provided Yes   Education Details PT reiterated the importance of performing HEP (balance at counter, walking, strengthening, and stretching) to maintain gains made in PT.   Person(s) Educated Patient   Methods Explanation   Comprehension Verbalized understanding          PT Short Term Goals - 07/27/15 1558    PT SHORT TERM GOAL #1   Title Independent with home exercise program to address lower extremity and core weakness and balance impairment. Target: 05/04/15   Status Achieved   PT SHORT TERM GOAL #2   Title Increase Berg Balance Test score to 38/56. Target: 05/04/15   Baseline 05/04/15- 42/56   Status Achieved   PT SHORT TERM GOAL #3   Title Decrease TUG time to 22 seconds with LRAD. Target: 05/04/15   Baseline 05/04/15- 27.34 sec's  with RW   Status Not Met   PT SHORT TERM GOAL #4   Title Increase gait speed with cane to 1.35 ft/sec for increased efficiency with ambulation.   Baseline 05/04/15- 1.58 ft/sec with walker   Status Achieved   PT SHORT TERM GOAL #5   Title Ambulate 300' with cane on indoor, level surfaces with supervision without scissoring while turning for improving independence with ambulation.   Baseline Extended to long term goal   Status Partially Met           PT Long Term Goals - 07/27/15 1600    PT LONG TERM GOAL #1   Title Increase Berg Balance Test score to >45/56 for decreased fall risk. New target date: 08/01/15.   Baseline Met on 06/20/15 but scored 42/56 on 07/24/15 likely due to L LE pain inhibiting progress.   Status Achieved   PT LONG TERM GOAL #2   Title Decrease TUG time to 19 seconds with LRAD without scissoring during turn. Target: 08/01/15.   Baseline 06/01/15: 22.47 sec's with walker   Status Not Met   PT LONG TERM GOAL #3   Title Increase gait speed with cane on level surfaces to >1.8 ft/sec for decreased fall risk. Target 06/03/15   Baseline 06/01/15: 2.02 ft/sec with walker, not tested with cane   Status Achieved   PT LONG TERM GOAL #4   Title Ambulate 1000' with single point cane and MOD I for increased efficiency and independence with ambulation. Target: 08/01/15.   Baseline 06/01/15: pt walking some at home with cane inside only, where he can furniture/wall walk as well. Not up to this distance as yet.   Status Not Met   PT LONG TERM GOAL #5   Title Increase score on paper copy of [employer] Balance Confidence test to 55% for improved balance confidence. Target: 08/01/15.   Status Not Met   PT LONG TERM GOAL #6   Title ------------------------------   PT LONG TERM GOAL #7   Title ------------------------------   PT LONG TERM GOAL #8   Title -----------------------------------               Plan - 07/27/15 1549    Clinical Impression Statement Pt partially met STG, pt did  not meet LTG 4 or 5. Please see d/c summary for details.     G-code: mobility BERG: 42/56 Goal: CI Discharge: CK    Problem List Patient Active Problem List   Diagnosis Date Noted  . Restless legs syndrome 03/14/2015  . DOE (dyspnea on exertion) 03/09/2015  . Sinus bradycardia 09/21/2014  . Thoracic spondylosis with myelopathy 09/13/2014  . Acute diastolic heart failure 33/00/7622  .  Cervical arthritis with myelopathy 07/18/2014  . Constipation - functional   . CAP (community acquired pneumonia) 07/10/2014  . Sepsis 07/10/2014  . HTN (hypertension) 07/10/2014  . OSA on CPAP 07/10/2014  . UTI (lower urinary tract infection) 07/10/2014  . Edema leg 07/10/2014  . Acute respiratory failure with hypoxia 07/10/2014  . 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 Other vitamin B12 deficiency anemia 07/13/2013  . 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 Polyneuropathy in other diseases classified elsewhere 02/17/2013  . Other general symptoms(780.99) 02/17/2013  . Abnormality of gait 02/17/2013  . Cervical spondylosis with myelopathy 02/17/2013    , L 07/27/2015, 4:10 PM  [facility] [address] [place], [place], [address] Phone: [phone]   Fax:  [phone]     PHYSICAL THERAPY DISCHARGE SUMMARY  Visits from Start of Care: 28  Current functional level related to goals / functional outcomes:     PT Short Term Goals - 07/27/15 1558    PT SHORT TERM GOAL #1   Title Independent with home exercise program to address lower extremity and core weakness and balance impairment. Target: 05/04/15   Status Achieved   PT SHORT TERM GOAL #2   Title Increase Berg Balance Test score to 38/56. Target: 05/04/15   Baseline 05/04/15- 42/56   Status Achieved   PT SHORT TERM GOAL #3   Title Decrease TUG time to 22 seconds with LRAD. Target: 05/04/15   Baseline 05/04/15- 27.34 sec's with RW    Status Not Met   PT SHORT TERM GOAL #4   Title Increase gait speed with cane to 1.35 ft/sec for increased efficiency with ambulation.   Baseline 05/04/15- 1.58 ft/sec with walker   Status Achieved   PT SHORT TERM GOAL #5   Title Ambulate 300' with cane on indoor, level surfaces with supervision without scissoring while turning for improving independence with ambulation.   Baseline Extended to long term goal   Status Partially Met         PT Long Term Goals - 07/27/15 1600    PT LONG TERM GOAL #1   Title Increase Berg Balance Test score to >45/56 for decreased fall risk. New target date: 08/01/15.   Baseline Met on 06/20/15 but scored 42/56 on 07/24/15 likely due to L LE pain inhibiting progress.   Status Achieved   PT LONG TERM GOAL #2   Title Decrease TUG time to 19 seconds with LRAD without scissoring during turn. Target: 08/01/15.   Baseline 06/01/15: 22.47 sec's with walker   Status Not Met   PT LONG TERM GOAL #3   Title Increase gait speed with cane on level surfaces to >1.8 ft/sec for decreased fall risk. Target 06/03/15   Baseline 06/01/15: 2.02 ft/sec with walker, not tested with cane   Status Achieved   PT LONG TERM GOAL #4   Title Ambulate 1000' with single point cane and MOD I for increased efficiency and independence with ambulation. Target: 08/01/15.   Baseline 06/01/15: pt walking some at home with cane inside only, where he can furniture/wall walk as well. Not up to this distance as yet.   Status Not Met   PT LONG TERM GOAL #5   Title Increase score on paper copy of [employer] Balance Confidence test to 55% for improved balance confidence. Target: 08/01/15.   Status Not Met   PT LONG TERM GOAL #6   Title ------------------------------   PT LONG TERM GOAL #7  Title ------------------------------   PT LONG TERM GOAL #8   Title -----------------------------------        Remaining deficits: Impaired balance, abnormality of gait (narrow BOS), decreased strength. Pt educated to  return to PT if pt experiences significant improvement in strength and gait.   Education / Equipment: HEP  Plan: Patient agrees to discharge.  Patient goals were partially met. Patient is being discharged due to meeting the stated rehab goals.  ?????       Geoffry Paradise, PT,DPT 07/27/2015 4:10 PM Phone: [phone] Fax: [phone]

## 2015-08-01 ENCOUNTER — Ambulatory Visit: Payer: Medicare Other | Admitting: Physical Therapy

## 2015-08-03 ENCOUNTER — Ambulatory Visit: Payer: Medicare Other

## 2015-08-17 ENCOUNTER — Ambulatory Visit (INDEPENDENT_AMBULATORY_CARE_PROVIDER_SITE_OTHER): Payer: Medicare Other | Admitting: Cardiovascular Disease

## 2015-08-17 ENCOUNTER — Encounter: Payer: Self-pay | Admitting: Cardiovascular Disease

## 2015-08-17 VITALS — BP 138/82 | HR 52 | Ht 73.0 in | Wt 282.4 lb

## 2015-08-17 DIAGNOSIS — I10 Essential (primary) hypertension: Secondary | ICD-10-CM

## 2015-08-17 DIAGNOSIS — R600 Localized edema: Secondary | ICD-10-CM | POA: Diagnosis not present

## 2015-08-17 DIAGNOSIS — I5042 Chronic combined systolic (congestive) and diastolic (congestive) heart failure: Secondary | ICD-10-CM | POA: Insufficient documentation

## 2015-08-17 DIAGNOSIS — I5031 Acute diastolic (congestive) heart failure: Secondary | ICD-10-CM

## 2015-08-17 DIAGNOSIS — Z13220 Encounter for screening for lipoid disorders: Secondary | ICD-10-CM

## 2015-08-17 DIAGNOSIS — I5022 Chronic systolic (congestive) heart failure: Secondary | ICD-10-CM

## 2015-08-17 LAB — BASIC METABOLIC PANEL
BUN: 32 mg/dL — ABNORMAL HIGH (ref 6–23)
CHLORIDE: 107 meq/L (ref 96–112)
CO2: 31 meq/L (ref 19–32)
CREATININE: 1.28 mg/dL (ref 0.40–1.50)
Calcium: 9.5 mg/dL (ref 8.4–10.5)
GFR: 58.94 mL/min — ABNORMAL LOW (ref >60.00)
GLUCOSE: 122 mg/dL — AB (ref 70–99)
Potassium: 4.4 mEq/L (ref 3.5–5.1)
Sodium: 144 mEq/L (ref 135–145)

## 2015-08-17 NOTE — Patient Instructions (Addendum)
Medication Instructions:  Your physician recommends that you continue on your current medications as directed. Please refer to the Current Medication list given to you today.   Labwork: TODAY - basic metabolic panel  Your physician recommends that you return for lab work in: 1 year on the day of or a few days before your office visit with Dr. Acie Fredrickson.  You will need to FAST for this appointment - nothing to eat or drink after midnight the night before except water.   Testing/Procedures: None Ordered   Follow-Up: Your physician wants you to follow-up in: 1 year with Dr. Acie Fredrickson.  You will receive a reminder letter in the mail two months in advance. If you don't receive a letter, please call our office to schedule the follow-up appointment.    For your  leg edema you  should do  the following 1. Leg elevation - I recommend the [address]ounge Dr. Leg rest.  See below for details  2. Salt restriction  -  Use potassium chloride instead of regular salt as a salt substitute. 3. Walk regularly 4. Compression hose - [employer] 5. Weight loss     Go to [email or web address]

## 2015-08-17 NOTE — Progress Notes (Signed)
Cardiology Office Note   Date:  08/17/2015   ID:  Travis [name], Travis [name] [birth year], MRN [id number]  PCP:  Simona Huh, MD  Cardiologist:   [name], MD   Chief Complaint  Patient presents with  . Hypertension   1. Hypertension 2. Hypothyroidism 3. DM 4. OSA   History of Present Illness: Travis [name] is seen back today for a post hospital visit. He is the father - in -law of Travis [name] ([employer] nurse) and Travis ([place]) I saw him in consultatoin on July 17, 2014 for issues related to his diastolic dysfunction in the setting of pneumonia / respiratory failure He was seen By Truitt Merle in late August for follow up of his hospitalization.   Travis [name] He is a 69 year old male with HTN, hypothyroidism, OSA, poorly controlled type 2 DM, former smoker and DJD.  Admitted back in July with CAP/UTI with associated sepsis. Was intubated due to progressive respiratory failure. Had AKI/ATN in the setting of PNA, hypoxic RF and septic shock. Felt to have some degree of diastolic HF - echo was not of good quality. Apparently has had past high sodium diet and uncontrolled HTN. Looks like he was on Lisinopril HCT, cardura and a lower dose of beta blocker. Mildly elevated troponin but no anginal symptoms and this was suspected to be from respiratory failure/CAP with septic shock.   Was seen by Cecille Rubin on August 28, Reported getting stronger, Breathing was ok. Leg edema has improved.   Sept. 9, 2015:  [name] is seen today with his wife. Walking with a walker. Getting stronger every day.  Has cut out his high salt foods.  He i still working Administrator, sports estate)  Has been seen by home health nurses - HR has been slow. No episodes of syncope.   Sept. 24, 2015:  Travis [name] is doing OK but continues to gradually gained strength. His home health nurse recently noted that his heart rate was very slow. Occasionally have some episodes of very mild lightheadedness when he sat on the  toilet type of bowel movement. He has not had episodes of syncope and seems to be doing pretty well at other times. He Has not had any syncope.   Dec. 11, 2015:  Travis [name] is here today for his HTN,  Has OSA - has been wearing his CPAP. , no CP and no dyspnea. Occasional eipisodes of orthostatic hypotension. Making progress with his rehab but he had to suspend it because of his elevated BP Does his best to avoid salt.  We reviewed his diet - no high salt items typically .  BP at the rehab is typically elevated but    March 09, 2015:   Travis [name] is a 69 y.o. male who presents for follow-up of his high blood pressure. He slowly getting stronger but still walks with the assistance of a walker. He has some left leg pain and he has had to re-start using his walker again - was using a cane.  Wears his CPAP at night.  BP yesterday was [id number] systolic .   He had some lightheadedness yesterday - glucose was ok.  BP was ok  August 17, 2015:  Doing well. Going to rehab -  BP looks great  No CP or dyspnea    Past Medical History  Diagnosis Date  . PONV (postoperative nausea and vomiting)   . Hypertension   . DJD (degenerative joint disease)  WHOLE SPINE  . Hyperthyroidism     Had Iodine to resolve issue DX 10 YR AGO  . Hypothyroidism     Low d/t treatment of hyperthyroidism  . Sleep apnea     DX  2009/2010 study done at [facility]  . Type II diabetes mellitus     diet controlled  . Heart murmur     Evaluated by cardiologist 4-5 years ago but does not currently see a cardiologist  . Gait disorder   . Cervical myelopathy     with myelomalacia at C5-6 and C3-4  . RLS (restless legs syndrome)   . Peripheral neuropathy   . 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 Constipation - functional     controlled with miralax  . RBBB   . 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 CHF (congestive heart failure)   . Pneumonia   . Restless legs syndrome 03/14/2015    Past Surgical  History  Procedure Laterality Date  . Cholecystectomy    . Back surgery      6 surgeries  . Lumbar laminectomy/decompression microdiscectomy  07/14/2012    Procedure: LUMBAR LAMINECTOMY/DECOMPRESSION MICRODISCECTOMY 2 LEVELS;  Surgeon: Eustace Moore, MD;  Location: [facility];  Service: Neurosurgery;  Laterality: Left;  Lumbar two three laminectomy, left thoracic four five transpedicular diskectomy     Current Outpatient Prescriptions  Medication Sig Dispense Refill  . baclofen (LIORESAL) 10 MG tablet Take 1 tablet (10 mg total) by mouth 3 (three) times daily. For muscle spasms 90 each 1  . diclofenac (VOLTAREN) 75 MG EC tablet Take 75 mg by mouth daily.  2  . escitalopram (LEXAPRO) 10 MG tablet Take 10 mg by mouth daily.  2  . fesoterodine (TOVIAZ) 4 MG TB24 tablet Take 4 mg by mouth 2 (two) times daily.    . furosemide (LASIX) 40 MG tablet Take 40 mg by mouth 2 (two) times daily.    Travis [name] gabapentin (NEURONTIN) 400 MG capsule TAKE 1 CAPSULE (400 MG TOTAL) BY MOUTH EVERY 8 (EIGHT) HOURS. 90 capsule 0  . glimepiride (AMARYL) 2 MG tablet Take 2 mg by mouth daily with breakfast.    . KLOR-CON 10 10 MEQ tablet TAKE 1 TABLET (10 MEQ TOTAL) BY MOUTH DAILY. 30 tablet 6  . levothyroxine (SYNTHROID, LEVOTHROID) 150 MCG tablet Take 175 mcg by mouth daily.     Travis [name] levothyroxine (SYNTHROID, LEVOTHROID) 175 MCG tablet Take 175 mcg by mouth daily.  2  . losartan (COZAAR) 100 MG tablet Take 1 tablet (100 mg total) by mouth daily. 90 tablet 1  . polyethylene glycol (MIRALAX / GLYCOLAX) packet Take 17 g by mouth daily as needed. For constipation 14 each 0  . pramipexole (MIRAPEX) 0.5 MG tablet Take 0.5 mg by mouth 2 (two) times daily.     . tamsulosin (FLOMAX) 0.4 MG CAPS capsule Take 0.4 mg by mouth daily.     . TOVIAZ 8 MG TB24 tablet Take 8 mg by mouth daily.  11   No current facility-administered medications for this visit.    Allergies:   Other    Social History:  The patient  reports that he quit  smoking about 30 years ago. His smoking use included Cigarettes. He has a 40 pack-year smoking history. He quit smokeless tobacco use about 30 years ago. He reports that he does not drink alcohol or use illicit drugs.   Family History:  The patient's family history includes CAD in an other family  member; Diabetes in his father; Heart disease in his father; Hypertension in his father; Polycythemia in his mother.    ROS:  Please see the history of present illness.    Review of Systems: Constitutional:  denies fever, chills, diaphoresis, appetite change and fatigue.  HEENT: denies photophobia, eye pain, redness, hearing loss, ear pain, congestion, sore throat, rhinorrhea, sneezing, neck pain, neck stiffness and tinnitus.  Respiratory: admits to SOB, DOE, cough, chest tightness, and wheezing.  Cardiovascular: admits to   leg swelling.  Gastrointestinal: denies nausea, vomiting, abdominal pain, diarrhea, constipation, blood in stool.  Genitourinary: denies dysuria, urgency, frequency, hematuria, flank pain and difficulty urinating.  Musculoskeletal: denies  myalgias, back pain, joint swelling, arthralgias and gait problem.   Skin: denies pallor, rash and wound.  Neurological: denies dizziness, seizures, syncope, weakness, light-headedness, numbness and headaches.   Hematological: denies adenopathy, easy bruising, personal or family bleeding history.  Psychiatric/ Behavioral: denies suicidal ideation, mood changes, confusion, nervousness, sleep disturbance and agitation.       All other systems are reviewed and negative.    PHYSICAL EXAM: VS:  BP 138/82 mmHg  Pulse 52  Ht 6\' 1"  (1.854 m)  Wt 128.096 kg (282 lb 6.4 oz)  BMI 37.27 kg/m2 , BMI Body mass index is 37.27 kg/(m^2). GEN: Well nourished, well developed, in no acute distress HEENT: normal Neck: no JVD, carotid bruits, or masses Cardiac: RRR; no murmurs, rubs, or gallops, trace bilateral edema  Respiratory:  clear to auscultation  bilaterally, normal work of breathing GI: soft, nontender, nondistended, + BS MS: no deformity or atrophy Skin: warm and dry, no rash Neuro:  Strength and sensation are intact.  He was walking with the assistance of a walker. Psych: normal   EKG:  EKG is ordered today. The ekg ordered today demonstrates normal sinus rhythm at 67. He has occasional premature ventricular contractions. He has right bundle branch block.   Recent Labs: 09/21/2014: TSH 2.17 01/05/2015: BUN 32*; Creatinine, Ser 1.07; Potassium 3.9; Sodium 140    Lipid Panel    Component Value Date/Time   TRIG 145 07/18/2014 0500      Wt Readings from Last 3 Encounters:  08/17/15 128.096 kg (282 lb 6.4 oz)  03/19/15 124.739 kg (275 lb)  03/14/15 127.007 kg (280 lb)      Other studies Reviewed: Additional studies/ records that were reviewed today include: . Review of the above records demonstrates:    ASSESSMENT AND PLAN:  1. Hypertension- blood pressures fairly well-controlled. He still has some occasional episodes of dizziness that may be due to orthostatic hypertension. Continue same medications for now. 2. Hypothyroidism 3. DM- glucose levels have been well-controlled. 4. OSA 5. Mild systolic CHF:  Echocardiogram that has normal left ventricle systolic function. A Myoview study in March showed that his ejection fracture was 48%. He's on losartan. His heart rate is 52 so we will not be able to add a beta blocker. We'll continue with his current medications. If he develops hypertension and we will start him on a different ARB instead of losartan.   Current medicines are reviewed at length with the patient today.  The patient does not have concerns regarding medicines.  The following changes have been made:  no change  Labs/ tests ordered today include:   No orders of the defined types were placed in this encounter.     Disposition:   FU with me in 1 year     Signed, Nahser, Wonda Cheng, MD  08/17/2015  10:43 AM    [facility] [address], [place], [place]  [address] Phone: [phone]; Fax: [phone]

## 2015-08-19 ENCOUNTER — Emergency Department (HOSPITAL_COMMUNITY): Payer: Medicare Other

## 2015-08-19 ENCOUNTER — Inpatient Hospital Stay (HOSPITAL_COMMUNITY)
Admission: EM | Admit: 2015-08-19 | Discharge: 2015-08-23 | DRG: 871 | Disposition: A | Discharge status: Home Health | Payer: Medicare Other | Attending: Internal Medicine | Admitting: Internal Medicine

## 2015-08-19 ENCOUNTER — Encounter (HOSPITAL_COMMUNITY): Payer: Self-pay | Admitting: *Deleted

## 2015-08-19 DIAGNOSIS — I5022 Chronic systolic (congestive) heart failure: Secondary | ICD-10-CM | POA: Diagnosis not present

## 2015-08-19 DIAGNOSIS — N179 Acute kidney failure, unspecified: Secondary | ICD-10-CM | POA: Diagnosis present

## 2015-08-19 DIAGNOSIS — I5042 Chronic combined systolic (congestive) and diastolic (congestive) heart failure: Secondary | ICD-10-CM | POA: Diagnosis present

## 2015-08-19 DIAGNOSIS — Z87891 Personal history of nicotine dependence: Secondary | ICD-10-CM | POA: Diagnosis not present

## 2015-08-19 DIAGNOSIS — N390 Urinary tract infection, site not specified: Secondary | ICD-10-CM

## 2015-08-19 DIAGNOSIS — I10 Essential (primary) hypertension: Secondary | ICD-10-CM | POA: Diagnosis present

## 2015-08-19 DIAGNOSIS — E0590 Thyrotoxicosis, unspecified without thyrotoxic crisis or storm: Secondary | ICD-10-CM | POA: Insufficient documentation

## 2015-08-19 DIAGNOSIS — G2581 Restless legs syndrome: Secondary | ICD-10-CM | POA: Diagnosis present

## 2015-08-19 DIAGNOSIS — R6883 Chills (without fever): Secondary | ICD-10-CM

## 2015-08-19 DIAGNOSIS — R509 Fever, unspecified: Secondary | ICD-10-CM | POA: Diagnosis not present

## 2015-08-19 DIAGNOSIS — J189 Pneumonia, unspecified organism: Secondary | ICD-10-CM | POA: Diagnosis not present

## 2015-08-19 DIAGNOSIS — J9601 Acute respiratory failure with hypoxia: Secondary | ICD-10-CM | POA: Diagnosis not present

## 2015-08-19 DIAGNOSIS — M549 Dorsalgia, unspecified: Secondary | ICD-10-CM | POA: Diagnosis present

## 2015-08-19 DIAGNOSIS — E039 Hypothyroidism, unspecified: Secondary | ICD-10-CM | POA: Diagnosis present

## 2015-08-19 DIAGNOSIS — Z9989 Dependence on other enabling machines and devices: Secondary | ICD-10-CM

## 2015-08-19 DIAGNOSIS — A419 Sepsis, unspecified organism: Principal | ICD-10-CM | POA: Diagnosis present

## 2015-08-19 DIAGNOSIS — Y95 Nosocomial condition: Secondary | ICD-10-CM | POA: Diagnosis present

## 2015-08-19 DIAGNOSIS — M1990 Unspecified osteoarthritis, unspecified site: Secondary | ICD-10-CM | POA: Diagnosis present

## 2015-08-19 DIAGNOSIS — E1140 Type 2 diabetes mellitus with diabetic neuropathy, unspecified: Secondary | ICD-10-CM | POA: Diagnosis present

## 2015-08-19 DIAGNOSIS — G4733 Obstructive sleep apnea (adult) (pediatric): Secondary | ICD-10-CM | POA: Diagnosis present

## 2015-08-19 DIAGNOSIS — I5033 Acute on chronic diastolic (congestive) heart failure: Secondary | ICD-10-CM | POA: Diagnosis not present

## 2015-08-19 DIAGNOSIS — I248 Other forms of acute ischemic heart disease: Secondary | ICD-10-CM | POA: Diagnosis present

## 2015-08-19 DIAGNOSIS — Z8249 Family history of ischemic heart disease and other diseases of the circulatory system: Secondary | ICD-10-CM

## 2015-08-19 DIAGNOSIS — I5043 Acute on chronic combined systolic (congestive) and diastolic (congestive) heart failure: Secondary | ICD-10-CM | POA: Insufficient documentation

## 2015-08-19 DIAGNOSIS — I509 Heart failure, unspecified: Secondary | ICD-10-CM | POA: Diagnosis not present

## 2015-08-19 DIAGNOSIS — E119 Type 2 diabetes mellitus without complications: Secondary | ICD-10-CM

## 2015-08-19 DIAGNOSIS — IMO0001 Reserved for inherently not codable concepts without codable children: Secondary | ICD-10-CM | POA: Insufficient documentation

## 2015-08-19 DIAGNOSIS — G8929 Other chronic pain: Secondary | ICD-10-CM | POA: Diagnosis present

## 2015-08-19 DIAGNOSIS — I4510 Unspecified right bundle-branch block: Secondary | ICD-10-CM | POA: Diagnosis present

## 2015-08-19 DIAGNOSIS — R001 Bradycardia, unspecified: Secondary | ICD-10-CM | POA: Diagnosis present

## 2015-08-19 DIAGNOSIS — R0602 Shortness of breath: Secondary | ICD-10-CM

## 2015-08-19 HISTORY — DX: Disorder of kidney and ureter, unspecified: N28.9

## 2015-08-19 LAB — COMPREHENSIVE METABOLIC PANEL
ALBUMIN: 4.2 g/dL (ref 3.5–5.0)
ALK PHOS: 83 U/L (ref 38–126)
ALT: 36 U/L (ref 17–63)
ANION GAP: 13 (ref 5–15)
AST: 31 U/L (ref 15–41)
BUN: 29 mg/dL — AB (ref 6–20)
CO2: 23 mmol/L (ref 22–32)
Calcium: 9.1 mg/dL (ref 8.9–10.3)
Chloride: 106 mmol/L (ref 101–111)
Creatinine, Ser: 1.39 mg/dL — ABNORMAL HIGH (ref 0.61–1.24)
GFR calc Af Amer: 58 mL/min — ABNORMAL LOW (ref >60)
GFR calc non Af Amer: 50 mL/min — ABNORMAL LOW (ref >60)
GLUCOSE: 175 mg/dL — AB (ref 65–99)
POTASSIUM: 4.3 mmol/L (ref 3.5–5.1)
SODIUM: 142 mmol/L (ref 135–145)
Total Bilirubin: 0.6 mg/dL (ref 0.3–1.2)
Total Protein: 7.2 g/dL (ref 6.5–8.1)

## 2015-08-19 LAB — URINE MICROSCOPIC-ADD ON

## 2015-08-19 LAB — TROPONIN I
TROPONIN I: 0.05 ng/mL — AB (ref <0.031)
Troponin I: 0.34 ng/mL — ABNORMAL HIGH (ref <0.031)

## 2015-08-19 LAB — CBC WITH DIFFERENTIAL/PLATELET
Basophils Absolute: 0 10*3/uL (ref 0.0–0.1)
Basophils Relative: 0 % (ref 0–1)
EOS PCT: 1 % (ref 0–5)
Eosinophils Absolute: 0.1 10*3/uL (ref 0.0–0.7)
HEMATOCRIT: 41.5 % (ref 39.0–52.0)
Hemoglobin: 13.9 g/dL (ref 13.0–17.0)
LYMPHS PCT: 7 % — AB (ref 12–46)
Lymphs Abs: 0.5 10*3/uL — ABNORMAL LOW (ref 0.7–4.0)
MCH: 31.5 pg (ref 26.0–34.0)
MCHC: 33.5 g/dL (ref 30.0–36.0)
MCV: 94.1 fL (ref 78.0–100.0)
MONO ABS: 0.4 10*3/uL (ref 0.1–1.0)
MONOS PCT: 6 % (ref 3–12)
NEUTROS ABS: 6.1 10*3/uL (ref 1.7–7.7)
Neutrophils Relative %: 86 % — ABNORMAL HIGH (ref 43–77)
Platelets: 159 10*3/uL (ref 150–400)
RBC: 4.41 MIL/uL (ref 4.22–5.81)
RDW: 13.9 % (ref 11.5–15.5)
WBC: 7 10*3/uL (ref 4.0–10.5)

## 2015-08-19 LAB — URINALYSIS, ROUTINE W REFLEX MICROSCOPIC
BILIRUBIN URINE: NEGATIVE
Glucose, UA: NEGATIVE mg/dL
KETONES UR: NEGATIVE mg/dL
Nitrite: NEGATIVE
Protein, ur: NEGATIVE mg/dL
SPECIFIC GRAVITY, URINE: 1.012 (ref 1.005–1.030)
UROBILINOGEN UA: 0.2 mg/dL (ref 0.0–1.0)
pH: 5 (ref 5.0–8.0)

## 2015-08-19 LAB — I-STAT CG4 LACTIC ACID, ED
LACTIC ACID, VENOUS: 2.29 mmol/L — AB (ref 0.5–2.0)
LACTIC ACID, VENOUS: 1.73 mmol/L (ref 0.5–2.0)

## 2015-08-19 LAB — LACTIC ACID, PLASMA
LACTIC ACID, VENOUS: 1.9 mmol/L (ref 0.5–2.0)
LACTIC ACID, VENOUS: 1.7 mmol/L (ref 0.5–2.0)
Lactic Acid, Venous: 1.9 mmol/L (ref 0.5–2.0)

## 2015-08-19 LAB — PROTIME-INR
INR: 1.18 (ref 0.00–1.49)
Prothrombin Time: 15.2 seconds (ref 11.6–15.2)

## 2015-08-19 LAB — APTT: APTT: 27 s (ref 24–37)

## 2015-08-19 LAB — PROCALCITONIN: PROCALCITONIN: 2.53 ng/mL

## 2015-08-19 LAB — BRAIN NATRIURETIC PEPTIDE: B Natriuretic Peptide: 94.3 pg/mL (ref 0.0–100.0)

## 2015-08-19 MED ORDER — ONDANSETRON HCL 4 MG/2ML IJ SOLN
4.0000 mg | Freq: Once | INTRAMUSCULAR | Status: AC
  Administered 2015-08-19: 4 mg via INTRAVENOUS
  Filled 2015-08-19: qty 2

## 2015-08-19 MED ORDER — ACETAMINOPHEN 650 MG RE SUPP: 650.0000 mg | Freq: Four times a day (QID) | RECTAL | Status: DC | PRN

## 2015-08-19 MED ORDER — ALBUTEROL SULFATE (2.5 MG/3ML) 0.083% IN NEBU
2.5000 mg | INHALATION_SOLUTION | Freq: Four times a day (QID) | RESPIRATORY_TRACT | Status: DC | PRN
  Administered 2015-08-20 – 2015-08-22 (×2): 2.5 mg via RESPIRATORY_TRACT
  Filled 2015-08-19 (×2): qty 3

## 2015-08-19 MED ORDER — PIPERACILLIN-TAZOBACTAM 3.375 G IVPB
3.3750 g | Freq: Three times a day (TID) | INTRAVENOUS | Status: DC
  Administered 2015-08-19 – 2015-08-22 (×9): 3.375 g via INTRAVENOUS
  Filled 2015-08-19 (×14): qty 50

## 2015-08-19 MED ORDER — ACETAMINOPHEN 325 MG PO TABS
650.0000 mg | ORAL_TABLET | Freq: Four times a day (QID) | ORAL | Status: DC | PRN
  Administered 2015-08-22 – 2015-08-23 (×2): 650 mg via ORAL
  Filled 2015-08-19 (×2): qty 2

## 2015-08-19 MED ORDER — SODIUM CHLORIDE 0.9 % IJ SOLN
3.0000 mL | Freq: Two times a day (BID) | INTRAMUSCULAR | Status: DC
Start: 2015-08-19 — End: 2015-08-20
  Administered 2015-08-19 – 2015-08-20 (×2): 3 mL via INTRAVENOUS

## 2015-08-19 MED ORDER — SODIUM CHLORIDE 0.9 % IV SOLN: 250.0000 mL | INTRAVENOUS | Status: DC | PRN

## 2015-08-19 MED ORDER — SODIUM CHLORIDE 0.9 % IV BOLUS (SEPSIS)
2500.0000 mL | Freq: Once | INTRAVENOUS | Status: AC
  Administered 2015-08-19: 2500 mL via INTRAVENOUS

## 2015-08-19 MED ORDER — ONDANSETRON HCL 4 MG/2ML IJ SOLN: 4.0000 mg | Freq: Four times a day (QID) | INTRAMUSCULAR | Status: DC | PRN

## 2015-08-19 MED ORDER — NITROGLYCERIN IN D5W 200-5 MCG/ML-% IV SOLN: 0.0000 ug/min | Freq: Once | INTRAVENOUS | Status: DC

## 2015-08-19 MED ORDER — OXYCODONE HCL 5 MG PO TABS: 5.0000 mg | ORAL_TABLET | ORAL | Status: DC | PRN

## 2015-08-19 MED ORDER — VANCOMYCIN HCL IN DEXTROSE 1-5 GM/200ML-% IV SOLN: 1000.0000 mg | INTRAVENOUS | Status: DC

## 2015-08-19 MED ORDER — VANCOMYCIN HCL IN DEXTROSE 1-5 GM/200ML-% IV SOLN
1000.0000 mg | Freq: Once | INTRAVENOUS | Status: DC
  Filled 2015-08-19: qty 200

## 2015-08-19 MED ORDER — ENOXAPARIN SODIUM 30 MG/0.3ML ~~LOC~~ SOLN
30.0000 mg | SUBCUTANEOUS | Status: DC
  Administered 2015-08-19: 30 mg via SUBCUTANEOUS
  Filled 2015-08-19 (×2): qty 0.3

## 2015-08-19 MED ORDER — VANCOMYCIN HCL 10 G IV SOLR
2000.0000 mg | INTRAVENOUS | Status: AC
  Administered 2015-08-19: 2000 mg via INTRAVENOUS
  Filled 2015-08-19: qty 2000

## 2015-08-19 MED ORDER — HYDROMORPHONE HCL 1 MG/ML IJ SOLN: 0.5000 mg | INTRAMUSCULAR | Status: DC | PRN

## 2015-08-19 MED ORDER — VANCOMYCIN HCL IN DEXTROSE 1-5 GM/200ML-% IV SOLN
1000.0000 mg | Freq: Two times a day (BID) | INTRAVENOUS | Status: DC
  Administered 2015-08-20 – 2015-08-23 (×7): 1000 mg via INTRAVENOUS
  Filled 2015-08-19 (×9): qty 200

## 2015-08-19 MED ORDER — ONDANSETRON HCL 4 MG PO TABS: 4.0000 mg | ORAL_TABLET | Freq: Four times a day (QID) | ORAL | Status: DC | PRN

## 2015-08-19 MED ORDER — ALUM & MAG HYDROXIDE-SIMETH 200-200-20 MG/5ML PO SUSP
30.0000 mL | Freq: Four times a day (QID) | ORAL | Status: DC | PRN
  Administered 2015-08-21: 30 mL via ORAL
  Filled 2015-08-19: qty 30

## 2015-08-19 MED ORDER — SODIUM CHLORIDE 0.9 % IJ SOLN: 3.0000 mL | INTRAMUSCULAR | Status: DC | PRN

## 2015-08-19 MED ORDER — PIPERACILLIN-TAZOBACTAM 3.375 G IVPB 30 MIN
3.3750 g | Freq: Once | INTRAVENOUS | Status: AC
  Administered 2015-08-19: 3.375 g via INTRAVENOUS
  Filled 2015-08-19: qty 50

## 2015-08-19 MED ORDER — ASPIRIN 81 MG PO CHEW
81.0000 mg | CHEWABLE_TABLET | Freq: Once | ORAL | Status: AC
Start: 2015-08-20 — End: 2015-08-20
  Administered 2015-08-20: 81 mg via ORAL
  Filled 2015-08-19: qty 1

## 2015-08-19 MED ORDER — INSULIN ASPART 100 UNIT/ML ~~LOC~~ SOLN
0.0000 [IU] | SUBCUTANEOUS | Status: DC
  Administered 2015-08-20: 2 [IU] via SUBCUTANEOUS
  Administered 2015-08-20 (×2): 1 [IU] via SUBCUTANEOUS

## 2015-08-19 NOTE — Progress Notes (Signed)
ANTIBIOTIC CONSULT NOTE - INITIAL  Pharmacy Consult for Vanco/Zosyn Indication: rule out sepsis  Allergies  Allergen Reactions  . Other Other (See Comments)    Anesthesia makes sick, must have antinausea medication in advance.    Patient Measurements: Height: 6\' 2"  (188 cm) Weight: 285 lb (129.275 kg) IBW/kg (Calculated) : 82.2 Adjusted Body Weight:    Vital Signs: Temp: 104.2 F (40.1 C) (08/21 1709) Temp Source: Rectal (08/21 1709) BP: 211/77 mmHg (08/21 1709) Pulse Rate: 121 (08/21 1709) Intake/Output from previous day:   Intake/Output from this shift:    Labs:  Recent Labs  08/17/15 1115  CREATININE 1.28   Estimated Creatinine Clearance: 76.7 mL/min (by C-G formula based on Cr of 1.28). No results for input(s): VANCOTROUGH, VANCOPEAK, VANCORANDOM, GENTTROUGH, GENTPEAK, GENTRANDOM, TOBRATROUGH, TOBRAPEAK, TOBRARND, AMIKACINPEAK, AMIKACINTROU, AMIKACIN in the last 72 hours.   Microbiology: No results found for this or any previous visit (from the past 720 hour(s)).  Medical History: Past Medical History  Diagnosis Date  . PONV (postoperative nausea and vomiting)   . Hypertension   . DJD (degenerative joint disease)     WHOLE SPINE  . Hyperthyroidism     Had Iodine to resolve issue DX 10 YR AGO  . Hypothyroidism     Low d/t treatment of hyperthyroidism  . Sleep apnea     DX  2009/2010 study done at [facility]  . Type II diabetes mellitus     diet controlled  . Heart murmur     Evaluated by cardiologist 4-5 years ago but does not currently see a cardiologist  . Gait disorder   . Cervical myelopathy     with myelomalacia at C5-6 and C3-4  . RLS (restless legs syndrome)   . Peripheral neuropathy   . 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 Constipation - functional     controlled with miralax  . RBBB   . 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 CHF (congestive heart failure)   . Pneumonia   . Restless legs syndrome 03/14/2015  . Renal disorder     acute  kidney failure    Medications: see med rec  Assessment: fever, chills, emesis 69 y/o M presents with fever, chills, emesis and temp 104.4. Recent hospitalization in July with CAP/UTI/ sepsis/VDRF.  Vitals: 104.2, BP 211/77, HR 121  Labs: Scr 1.28, CrCl 76, No CBC, LA, BNP yet  Labs:  Goal of Therapy:  Vancomycin trough level 15-20 mcg/ml  Plan:  Vanco 2g in ED, then start 1g IV q12h Zosyn 3.375g in ED then q8h   Erandi Lemma S. Alford Highland, PharmD, BCPS Clinical Staff Pharmacist Pager [phone]  Eilene Ghazi Stillinger 08/19/2015,5:13 PM

## 2015-08-19 NOTE — ED Notes (Addendum)
Pt here via [facility] for acute onset fever, chills and emesis.  EMS states states 104.4.  1000 mg acetaminophen and 4 mg zofran en-route.

## 2015-08-19 NOTE — ED Provider Notes (Signed)
History   Chief Complaint  Patient presents with  . Fever  . Emesis    HPI 69 year old male with past medical history as below notable for CHF, hypertension, diabetes who presents to ED for evaluation of fever. Patient reports having symptoms of nausea, shortness of breath only. Denies any recent cough, vomiting, diarrhea, abdominal pain, chest pain, rash, headache, neck pain or stiffness. Patient states he was in his usual state of health prior to today. Family is present and also providing history and states patient was sitting in his car with the windows rolled down outside in the shade as he does daily. They're concerned maybe he got overheated. Patient denies feeling lightheaded or having other symptoms currently. Patient does not use oxygen at baseline but does use it as needed as night. Patient was recently seen by his PCP for routine lab check and had a BMP done couple days ago which showed slight bump in creatinine to 1.28 from 1. Patient denies having increase leg swelling or abdominal swelling. No sick contacts, insect bites, travel. Discussed patient's history with his daughter who is a nurse who stated that patient had a similar episode like this last year where he was admitted found to have pneumonia and urosepsis and was treated with large amounts of IV fluids resulting in worsening of his pulmonary function and subsequent intubation. She raises concern about large fluid volume administration to prevent similar outcome.  EMS reports patient was hypertensive on arrival and increased work of breathing and placed patient on 2 L nasal cannula and patient remained with sats in the upper 80s to low 90s. They gave patient a gram of Tylenol. Patient remained alert and oriented during transport.  Past   medical/surgical history, social history, medications, allergies and FH have been reviewed with patient and/or in documentation. Furthermore, if pt family or friend(s) present, additional  historical information was obtained from them.  Past Medical History  Diagnosis Date  . PONV (postoperative nausea and vomiting)   . Hypertension   . DJD (degenerative joint disease)     WHOLE SPINE  . Hyperthyroidism     Had Iodine to resolve issue DX 10 YR AGO  . Hypothyroidism     Low d/t treatment of hyperthyroidism  . Sleep apnea     DX  2009/2010 study done at [facility]  . Type II diabetes mellitus     diet controlled  . Heart murmur     Evaluated by cardiologist 4-5 years ago but does not currently see a cardiologist  . Gait disorder   . Cervical myelopathy     with myelomalacia at C5-6 and C3-4  . RLS (restless legs syndrome)   . Peripheral neuropathy   . 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 Constipation - functional     controlled with miralax  . RBBB   . 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 CHF (congestive heart failure)   . Pneumonia   . Restless legs syndrome 03/14/2015  . Renal disorder     acute kidney failure   Past Surgical History  Procedure Laterality Date  . Cholecystectomy    . Back surgery      6 surgeries  . Lumbar laminectomy/decompression microdiscectomy  07/14/2012    Procedure: LUMBAR LAMINECTOMY/DECOMPRESSION MICRODISCECTOMY 2 LEVELS;  Surgeon: Eustace Moore, MD;  Location: [place];  Service: Neurosurgery;  Laterality: Left;  Lumbar two three laminectomy, left thoracic four five transpedicular diskectomy   Family History  Problem  Relation Age of Onset  . Polycythemia Mother   . Diabetes Father   . Hypertension Father   . Heart disease Father   . CAD      1 of his 4 brothers and father has CAD   Social History  Substance Use Topics  . Smoking status: Former Smoker -- 2.00 packs/day for 20 years    Types: Cigarettes    Quit date: 06/28/1985  . Smokeless tobacco: Former Systems developer    Quit date: 06/28/1985  . Alcohol Use: No     Review of Systems Constitutional: + F/C, fatigue.  HENT: - congestion, -rhinorrhea, -sore throat.    Eyes: - eye pain, -visual disturbance.  Respiratory: - cough, +SOB, -hemoptysis.   Cardiovascular: - CP, -palps.  Gastrointestinal: - N/V/D, -abd pain  Genitourinary: - flank pain, -dysuria, -frequency.  Musculoskeletal: - myalgia/arthritis, -joint swelling, -gait abnormality, -back pain, -neck pain/stiffness, -leg pain/swelling.  Skin: - rash/lesion.  Neurological: - focal weakness, -lightheadedness, -dizziness, -numbness, -HA.  All other systems reviewed and are negative.   Physical Exam  Physical Exam  ED Triage Vitals  Enc Vitals Group     BP --      Pulse --      Resp --      Temp --      Temp src --      SpO2 --      Weight 08/19/15 1703 285 lb (129.275 kg)     Height 08/19/15 1703 6\' 2"  (1.88 m)     Head Cir --      Peak Flow --      Pain Score 08/19/15 1704 0     Pain Loc --      Pain Edu? --      Excl. in [place]? --    Constitutional: Chronically ill appearing 69 year old male who appears toxic, moderate distress and increased work of breathing. Head: Normocephalic and atraumatic.  Eyes: Extraocular motion intact, no scleral icterus Mouth: MMM, OP clear Neck: Supple without meningismus, mass, or overt JVD Respiratory:increased work of breathing on exam without crackles, wheezing. CV: Tachycardic, no obvious murmurs.  Pulses +2 and symmetric. patient appears slightly volume overloaded with 1+ pitting edema bilateral lower extremities.  Abdomen: Soft, NT, ND, no r/g. No mass.  MSK: Extremities are atraumatic without deformity, ROM intact. Nontender bilateral lower extremities.  Skin: Warm, dry, intact without rash Neuro: AAOx4, MAE 5/5 sym, no focal deficit noted   ED Course  Procedures   Labs Reviewed  COMPREHENSIVE METABOLIC PANEL - Abnormal; Notable for the following:    Glucose, Bld 175 (*)    BUN 29 (*)    Creatinine, Ser 1.39 (*)    GFR calc non Af Amer 50 (*)    GFR calc Af Amer 58 (*)    All other components within normal limits  CBC WITH  DIFFERENTIAL/PLATELET - Abnormal; Notable for the following:    Neutrophils Relative % 86 (*)    Lymphocytes Relative 7 (*)    Lymphs Abs 0.5 (*)    All other components within normal limits  URINALYSIS, ROUTINE W REFLEX MICROSCOPIC (NOT AT [facility]) - Abnormal; Notable for the following:    APPearance CLOUDY (*)    Hgb urine dipstick SMALL (*)    Leukocytes, UA MODERATE (*)    All other components within normal limits  TROPONIN I - Abnormal; Notable for the following:    Troponin I 0.05 (*)    All other components within normal limits  URINE  MICROSCOPIC-ADD ON - Abnormal; Notable for the following:    Bacteria, UA FEW (*)    All other components within normal limits  TROPONIN I - Abnormal; Notable for the following:    Troponin I 0.34 (*)    All other components within normal limits  I-STAT CG4 LACTIC ACID, ED - Abnormal; Notable for the following:    Lactic Acid, Venous 2.29 (*)    All other components within normal limits  CULTURE, BLOOD (ROUTINE X 2)  CULTURE, BLOOD (ROUTINE X 2)  URINE CULTURE  LACTIC ACID, PLASMA  LACTIC ACID, PLASMA  BRAIN NATRIURETIC PEPTIDE  LACTIC ACID, PLASMA  PROTIME-INR  APTT  LACTIC ACID, PLASMA  PROCALCITONIN  BASIC METABOLIC PANEL  CBC  TROPONIN I  TROPONIN I  HEMOGLOBIN A1C  I-STAT CG4 LACTIC ACID, ED   I personally reviewed and interpreted all labs.  Dg Chest Port 1 View  08/19/2015   CLINICAL DATA:  Acute onset fever, chills and vomiting today.  EXAM: PORTABLE CHEST - 1 VIEW  COMPARISON:  Single view of the chest 07/15/2014 and 07/14/2012.  FINDINGS: There streaky airspace disease in the left lower lung zone. Lung volumes are low. Linear subsegmental atelectasis is seen in the right base. Mild asymmetric elevation of the right hemidiaphragm relative to the left is noted. Heart size is upper normal. No pneumothorax or pleural effusion.  IMPRESSION: Left basilar airspace disease is worrisome for pneumonia given symptoms although it could be  atelectasis in this low volume chest.  Mild, subsegmental atelectasis right lung base is noted.   Electronically Signed   By: Inge Rise M.D.   On: 08/19/2015 17:54   I personally viewed above image(s) which were used in my medical decision making. Formal interpretations by Radiology.   EKG Interpretation  Date/Time:  Sunday August 19 2015 18:05:10 EDT Ventricular Rate:  108 PR Interval:  168 QRS Duration: 146 QT Interval:  366 QTC Calculation: 491 R Axis:   -31 Text Interpretation:  Sinus tachycardia Right bundle branch block Since last tracing rate faster Confirmed by MILLER  MD, BRIAN ([id number]) on 08/19/2015 6:35:49 PM       MDM: Raylene Everts is a 69 y.o. male with H&P as above who p/w CC: fever  On arrival, patient is febrile to 104.2, tachypneic, hypoxic and placed on nonrebreather. Sats improved with this. Code sepsis called. Patient is alert and oriented 4. He does have clear lungs. No obvious source of infection at this time. Also there is concern that there could possibly be exposure contributing to patient's fever as well as he was outside in his car in the heat. Patient received 1 g of Tylenol by EMS also receive IV fluids, ABX, screening labs, chest x-ray, EKG.   EKG shows sinus tachycardia, right bundle branch block, no signs of acute ischemia and unchanged from prior EKGs.  Patient's lactate is 2.29. On reevaluation patient states he is feeling better after fluid administration. Patient was started on nitro drip for his hypertension.  Workup notable for pneumonia and UTI. On reevaluation patient reports he is feeling better but patient still appears to have increased work of breathing while on nonrebreather with sats 94%. Mentating well. Patient was then placed on BiPAP and patient reports improvement in his work of breathing following this.  Patient will be admitted to stepdown. Old records reviewed (if available). Labs and imaging reviewed personally by myself and  considered in medical decision making if ordered.  Clinical Impression: 1. Sepsis, due to  unspecified organism    Disposition: Admit  Condition: stable  I have discussed the results, Dx and Tx plan with the pt(& family if present). He/she/they expressed understanding and agree(s) with the plan.  Pt seen in conjunction with Dr. Henderson Baltimore, [facility] Resident - PGY-3     Kirstie Peri, MD 08/19/15 2249  Noemi Chapel, MD 08/19/15 [phone]

## 2015-08-19 NOTE — ED Notes (Signed)
Fluids running at 271ml/hr per EDP. Pt with hx of previous sepsis and flash pulmonary edema from fluid overload per family.

## 2015-08-19 NOTE — Progress Notes (Signed)
Pt transferred to 3S5 stable throughout uneventful. RN and RT at bedside on arrival with patient.

## 2015-08-19 NOTE — H&P (Signed)
[facility] Admission History and Physical       SAIGE ANGELL [id number] DOB: Nov 25, 1946 DOA: 08/19/2015  Referring physician: EDP PCP: Simona Huh, MD  Specialists:   Chief Complaint: Fever and Chills  HPI: Travis [name] is a 69 y.o. male with a history of combined Systolic and diastolic CHF, HTN, DM2 and OSA who presents to the Ed with complaints of fevers and chills, SOB and nausea and vomiting that started today.  He had a fever to 104.4.   He was found to have hypoxia when he arrived to the ED and was placed on BiPAP.   Chest X-Ray revealed a Left Basilar Pneumonia and he was found to have an elevated lactic acid and the Sepsis Protocol was initiated.   IV Vancomycin and Zosyn was started and he was referred for admission.      Review of Systems:  Constitutional: No Weight Loss, No Weight Gain, Night Sweats, +Fevers, Chills, Dizziness, Light Headedness, Fatigue, or Generalized Weakness HEENT: No Headaches, Difficulty Swallowing,Tooth/Dental Problems,Sore Throat,  No Sneezing, Rhinitis, Ear Ache, Nasal Congestion, or Post Nasal Drip,  Cardio-vascular:  No Chest pain, Orthopnea, PND, Edema in Lower Extremities, Anasarca, Dizziness, Palpitations  Resp: +Dyspnea, No DOE, No Productive Cough, No Non-Productive Cough, No Hemoptysis, No Wheezing.    GI: No Heartburn, Indigestion, Abdominal Pain, +Nausea, Vomiting, Diarrhea, Constipation, Hematemesis, Hematochezia, Melena, Change in Bowel Habits,  Loss of Appetite  GU: No Dysuria, No Change in Color of Urine, No Urgency or Urinary Frequency, No Flank pain.  Musculoskeletal: No Joint Pain or Swelling, No Decreased Range of Motion, No Back Pain.  Neurologic: No Syncope, No Seizures, Muscle Weakness, Paresthesia, Vision Disturbance or Loss, No Diplopia, No Vertigo, No Difficulty Walking,  Skin: No Rash or Lesions. Psych: No Change in Mood or Affect, No Depression or Anxiety, No Memory loss, No Confusion, or  Hallucinations   Past Medical History  Diagnosis Date  . PONV (postoperative nausea and vomiting)   . Hypertension   . DJD (degenerative joint disease)     WHOLE SPINE  . Hyperthyroidism     Had Iodine to resolve issue DX 10 YR AGO  . Hypothyroidism     Low d/t treatment of hyperthyroidism  . Sleep apnea     DX  2009/2010 study done at [facility]  . Type II diabetes mellitus     diet controlled  . Heart murmur     Evaluated by cardiologist 4-5 years ago but does not currently see a cardiologist  . Gait disorder   . Cervical myelopathy     with myelomalacia at C5-6 and C3-4  . RLS (restless legs syndrome)   . Peripheral neuropathy   . 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 Constipation - functional     controlled with miralax  . RBBB   . 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 CHF (congestive heart failure)   . Pneumonia   . Restless legs syndrome 03/14/2015  . Renal disorder     acute kidney failure     Past Surgical History  Procedure Laterality Date  . Cholecystectomy    . Back surgery      6 surgeries  . Lumbar laminectomy/decompression microdiscectomy  07/14/2012    Procedure: LUMBAR LAMINECTOMY/DECOMPRESSION MICRODISCECTOMY 2 LEVELS;  Surgeon: Eustace Moore, MD;  Location: [facility];  Service: Neurosurgery;  Laterality: Left;  Lumbar two three laminectomy, left thoracic four five transpedicular diskectomy      Prior to  Admission medications   Medication Sig Start Date End Date Taking? Authorizing Provider  baclofen (LIORESAL) 10 MG tablet Take 1 tablet (10 mg total) by mouth 3 (three) times daily. For muscle spasms 10/30/14  Yes Meredith Staggers, MD  diclofenac (VOLTAREN) 75 MG EC tablet Take 75 mg by mouth daily. 07/14/15  Yes Historical Provider, MD  escitalopram (LEXAPRO) 10 MG tablet Take 10 mg by mouth daily. 07/12/15  Yes Historical Provider, MD  furosemide (LASIX) 40 MG tablet Take 40 mg by mouth 2 (two) times daily.   Yes Historical Provider, MD   gabapentin (NEURONTIN) 400 MG capsule TAKE 1 CAPSULE (400 MG TOTAL) BY MOUTH EVERY 8 (EIGHT) HOURS. 04/16/15  Yes Meredith Staggers, MD  glimepiride (AMARYL) 2 MG tablet Take 2 mg by mouth daily with breakfast.   Yes Historical Provider, MD  KLOR-CON 10 10 MEQ tablet TAKE 1 TABLET (10 MEQ TOTAL) BY MOUTH DAILY. 03/27/15  Yes [name], MD  levothyroxine (SYNTHROID, LEVOTHROID) 175 MCG tablet Take 175 mcg by mouth daily. 08/12/15  Yes Historical Provider, MD  losartan (COZAAR) 100 MG tablet Take 1 tablet (100 mg total) by mouth daily. 03/14/15  Yes [name], MD  pramipexole (MIRAPEX) 0.5 MG tablet Take 0.5 mg by mouth 2 (two) times daily.  08/09/14  Yes Ivan Anchors Love, [name]  tamsulosin (FLOMAX) 0.4 MG CAPS capsule Take 0.4 mg by mouth daily.  09/13/14  Yes Historical Provider, MD  TOVIAZ 8 MG TB24 tablet Take 8 mg by mouth daily. 08/07/15  Yes Historical Provider, MD  polyethylene glycol (MIRALAX / GLYCOLAX) packet Take 17 g by mouth daily as needed. For constipation 08/09/14   Bary Leriche, [name]     Allergies  Allergen Reactions  . Other Other (See Comments)    Anesthesia makes sick, must have antinausea medication in advance.    Social History:  reports that he quit smoking about 30 years ago. His smoking use included Cigarettes. He has a 40 pack-year smoking history. He quit smokeless tobacco use about 30 years ago. He reports that he does not drink alcohol or use illicit drugs.     Family History  Problem Relation Age of Onset  . Polycythemia Mother   . Diabetes Father   . Hypertension Father   . Heart disease Father   . CAD      1 of his 4 brothers and father has CAD       Physical Exam:  GEN:  Pleasant Elderly Obese 69 y.o. Caucasian male examined and in no acute distress; cooperative with exam Filed Vitals:   08/19/15 1915 08/19/15 1920 08/19/15 1936 08/19/15 2001  BP: 112/50  121/50 125/51  Pulse: 84  78 74  Temp:  101.9 F (38.8 C)    TempSrc:  Rectal    Resp:  22  24 23   Height:      Weight:      SpO2: 95%  95% 96%   Blood pressure 125/51, pulse 74, temperature 101.9 F (38.8 C), temperature source Rectal, resp. rate 23, height 6\' 2"  (1.88 m), weight 129.275 kg (285 lb), SpO2 96 %. PSYCH: He is alert and oriented x4; does not appear anxious does not appear depressed; affect is normal HEENT: Normocephalic and Atraumatic, Mucous membranes pink; PERRLA; EOM intact; Fundi:  Benign;  No scleral icterus, Nares: Patent, Oropharynx: Clear, Fair Dentition,    Neck:  FROM, No Cervical Lymphadenopathy nor Thyromegaly or Carotid Bruit; No JVD; Breasts:: Not examined CHEST  WALL: No tenderness CHEST: Normal respiration, clear to auscultation bilaterally HEART: Regular rate and rhythm; no murmurs rubs or gallops BACK: No kyphosis or scoliosis; No CVA tenderness ABDOMEN: Positive Bowel Sounds, Obese, Soft Non-Tender, No Rebound or Guarding; No Masses, No Organomegaly Rectal Exam: Not done EXTREMITIES: No Cyanosis, Clubbing, or Edema; No Ulcerations. Genitalia: not examined PULSES: 2+ and symmetric SKIN: Normal hydration no rash or ulceration CNS:  Alert and Oriented x 4, No Focal Deficits Vascular: pulses palpable throughout   Labs on Admission:  Basic Metabolic Panel:  Recent Labs Lab 08/17/15 1115 08/19/15 1730  NA 144 142  K 4.4 4.3  CL 107 106  CO2 31 23  GLUCOSE 122* 175*  BUN 32* 29*  CREATININE 1.28 1.39*  CALCIUM 9.5 9.1   Liver Function Tests:  Recent Labs Lab 08/19/15 1730  AST 31  ALT 36  ALKPHOS 83  BILITOT 0.6  PROT 7.2  ALBUMIN 4.2   No results for input(s): LIPASE, AMYLASE in the last 168 hours. No results for input(s): AMMONIA in the last 168 hours. CBC:  Recent Labs Lab 08/19/15 1730  WBC 7.0  NEUTROABS 6.1  HGB 13.9  HCT 41.5  MCV 94.1  PLT 159   Cardiac Enzymes:  Recent Labs Lab 08/19/15 1730  TROPONINI 0.05*    BNP (last 3 results)  Recent Labs  08/19/15 1730  BNP 94.3    ProBNP (last 3  results) No results for input(s): PROBNP in the last 8760 hours.  CBG: No results for input(s): GLUCAP in the last 168 hours.  Radiological Exams on Admission: Dg Chest Port 1 View  08/19/2015   CLINICAL DATA:  Acute onset fever, chills and vomiting today.  EXAM: PORTABLE CHEST - 1 VIEW  COMPARISON:  Single view of the chest 07/15/2014 and 07/14/2012.  FINDINGS: There streaky airspace disease in the left lower lung zone. Lung volumes are low. Linear subsegmental atelectasis is seen in the right base. Mild asymmetric elevation of the right hemidiaphragm relative to the left is noted. Heart size is upper normal. No pneumothorax or pleural effusion.  IMPRESSION: Left basilar airspace disease is worrisome for pneumonia given symptoms although it could be atelectasis in this low volume chest.  Mild, subsegmental atelectasis right lung base is noted.   Electronically Signed   By: Inge Rise M.D.   On: 08/19/2015 17:54     EKG: Independently reviewed. Sinus Tach at 108 +RBBB       Assessment/Plan:   69 y.o. male with  Active Problems:   1.    Sepsis   Sepsis Protocol   IV Vancomycin and Zosyn   IVFs     2.    Acute respiratory failure with hypoxia   BiPAP PRN, NPO while on BiPAP   O2   Monitor O2 sats    3.    HCAP (healthcare-associated pneumonia)   IV Vancomycin and Zosyn   Albuterol Nebs PRN     4.    AKI (acute kidney injury)   Gentle IVFs   Hold Lasix dose     5.    Chronic systolic CHF (congestive heart failure)   Monitor     6.    Type II diabetes mellitus- Diet Controlled   SSI coverage PRN     7.    HTN (hypertension)   IV Hydralazine PRN     8.    Hypothyroid   On  Levothyroxine Restart when taking PO     9.  OSA on CPAP   `on CPAP qhs at home    10.   DVT Prophylaxis   Lovenox       Code Status:     FULL CODE      Family Communication:   Family at Bedside       Disposition Plan:    Inpatient Status        Time spent:  [facility] Pager [phone]   If [place] Please Contact the Day Rounding Team MD for [facility]  If 7PM-7AM, Please Contact Night-Floor Coverage  [email or web address] Password [email or web address] 08/19/2015, 8:07 PM     ADDENDUM:   Patient was seen and examined on 08/19/2015

## 2015-08-20 ENCOUNTER — Inpatient Hospital Stay (HOSPITAL_COMMUNITY): Payer: Medicare Other

## 2015-08-20 DIAGNOSIS — J189 Pneumonia, unspecified organism: Secondary | ICD-10-CM

## 2015-08-20 DIAGNOSIS — I5022 Chronic systolic (congestive) heart failure: Secondary | ICD-10-CM

## 2015-08-20 DIAGNOSIS — N179 Acute kidney failure, unspecified: Secondary | ICD-10-CM

## 2015-08-20 DIAGNOSIS — I10 Essential (primary) hypertension: Secondary | ICD-10-CM

## 2015-08-20 DIAGNOSIS — J9601 Acute respiratory failure with hypoxia: Secondary | ICD-10-CM

## 2015-08-20 LAB — GLUCOSE, CAPILLARY
GLUCOSE-CAPILLARY: 114 mg/dL — AB (ref 65–99)
GLUCOSE-CAPILLARY: 119 mg/dL — AB (ref 65–99)
GLUCOSE-CAPILLARY: 140 mg/dL — AB (ref 65–99)
GLUCOSE-CAPILLARY: 145 mg/dL — AB (ref 65–99)
GLUCOSE-CAPILLARY: 160 mg/dL — AB (ref 65–99)
GLUCOSE-CAPILLARY: 201 mg/dL — AB (ref 65–99)
Glucose-Capillary: 149 mg/dL — ABNORMAL HIGH (ref 65–99)

## 2015-08-20 LAB — LIPID PANEL
CHOL/HDL RATIO: 4.3 ratio
Cholesterol: 124 mg/dL (ref 0–200)
HDL: 29 mg/dL — ABNORMAL LOW (ref >40)
LDL CALC: 79 mg/dL (ref 0–99)
Triglycerides: 82 mg/dL (ref <150)
VLDL: 16 mg/dL (ref 0–40)

## 2015-08-20 LAB — CBC
HEMATOCRIT: 36.2 % — AB (ref 39.0–52.0)
Hemoglobin: 12 g/dL — ABNORMAL LOW (ref 13.0–17.0)
MCH: 32 pg (ref 26.0–34.0)
MCHC: 33.1 g/dL (ref 30.0–36.0)
MCV: 96.5 fL (ref 78.0–100.0)
PLATELETS: 132 10*3/uL — AB (ref 150–400)
RBC: 3.75 MIL/uL — AB (ref 4.22–5.81)
RDW: 14.3 % (ref 11.5–15.5)
WBC: 12.2 10*3/uL — AB (ref 4.0–10.5)

## 2015-08-20 LAB — BASIC METABOLIC PANEL
Anion gap: 7 (ref 5–15)
BUN: 30 mg/dL — AB (ref 6–20)
CO2: 29 mmol/L (ref 22–32)
Calcium: 8.5 mg/dL — ABNORMAL LOW (ref 8.9–10.3)
Chloride: 109 mmol/L (ref 101–111)
Creatinine, Ser: 1.69 mg/dL — ABNORMAL HIGH (ref 0.61–1.24)
GFR, EST AFRICAN AMERICAN: 46 mL/min — AB (ref >60)
GFR, EST NON AFRICAN AMERICAN: 39 mL/min — AB (ref >60)
Glucose, Bld: 149 mg/dL — ABNORMAL HIGH (ref 65–99)
POTASSIUM: 4 mmol/L (ref 3.5–5.1)
SODIUM: 145 mmol/L (ref 135–145)

## 2015-08-20 LAB — MRSA PCR SCREENING: MRSA by PCR: NEGATIVE

## 2015-08-20 LAB — TROPONIN I
TROPONIN I: 0.42 ng/mL — AB (ref <0.031)
TROPONIN I: 0.29 ng/mL — AB (ref <0.031)
TROPONIN I: 0.2 ng/mL — AB (ref <0.031)
TROPONIN I: 0.17 ng/mL — AB (ref <0.031)

## 2015-08-20 LAB — LACTIC ACID, PLASMA: Lactic Acid, Venous: 1.5 mmol/L (ref 0.5–2.0)

## 2015-08-20 LAB — TSH: TSH: 0.299 u[IU]/mL — ABNORMAL LOW (ref 0.350–4.500)

## 2015-08-20 MED ORDER — INSULIN ASPART 100 UNIT/ML ~~LOC~~ SOLN
0.0000 [IU] | Freq: Three times a day (TID) | SUBCUTANEOUS | Status: DC
  Administered 2015-08-21: 2 [IU] via SUBCUTANEOUS
  Administered 2015-08-21: 3 [IU] via SUBCUTANEOUS
  Administered 2015-08-22 – 2015-08-23 (×3): 2 [IU] via SUBCUTANEOUS

## 2015-08-20 MED ORDER — LEVOTHYROXINE SODIUM 175 MCG PO TABS
175.0000 ug | ORAL_TABLET | Freq: Every day | ORAL | Status: DC
  Administered 2015-08-20 – 2015-08-23 (×4): 175 ug via ORAL
  Filled 2015-08-20 (×4): qty 1

## 2015-08-20 MED ORDER — PRAMIPEXOLE DIHYDROCHLORIDE 0.25 MG PO TABS
0.5000 mg | ORAL_TABLET | Freq: Two times a day (BID) | ORAL | Status: DC
  Administered 2015-08-20 – 2015-08-23 (×7): 0.5 mg via ORAL
  Filled 2015-08-20 (×8): qty 2

## 2015-08-20 MED ORDER — BACLOFEN 5 MG HALF TABLET
5.0000 mg | ORAL_TABLET | Freq: Three times a day (TID) | ORAL | Status: DC
  Administered 2015-08-20 – 2015-08-23 (×10): 5 mg via ORAL
  Filled 2015-08-20 (×12): qty 1

## 2015-08-20 MED ORDER — CETYLPYRIDINIUM CHLORIDE 0.05 % MT LIQD
7.0000 mL | Freq: Two times a day (BID) | OROMUCOSAL | Status: DC
  Administered 2015-08-20 – 2015-08-23 (×6): 7 mL via OROMUCOSAL

## 2015-08-20 MED ORDER — POLYETHYLENE GLYCOL 3350 17 G PO PACK
17.0000 g | PACK | Freq: Every day | ORAL | Status: DC | PRN
  Administered 2015-08-21: 17 g via ORAL
  Filled 2015-08-20 (×2): qty 1

## 2015-08-20 MED ORDER — SODIUM CHLORIDE 0.9 % IV SOLN: INTRAVENOUS | Status: DC

## 2015-08-20 MED ORDER — SODIUM CHLORIDE 0.9 % IV SOLN
INTRAVENOUS | Status: DC
  Administered 2015-08-20 – 2015-08-21 (×2): via INTRAVENOUS

## 2015-08-20 MED ORDER — GABAPENTIN 100 MG PO CAPS
100.0000 mg | ORAL_CAPSULE | Freq: Three times a day (TID) | ORAL | Status: DC
  Administered 2015-08-20 – 2015-08-23 (×10): 100 mg via ORAL
  Filled 2015-08-20 (×10): qty 1

## 2015-08-20 MED ORDER — TAMSULOSIN HCL 0.4 MG PO CAPS
0.4000 mg | ORAL_CAPSULE | Freq: Every day | ORAL | Status: DC
  Administered 2015-08-20 – 2015-08-23 (×4): 0.4 mg via ORAL
  Filled 2015-08-20 (×5): qty 1

## 2015-08-20 MED ORDER — GLIMEPIRIDE 1 MG PO TABS
1.0000 mg | ORAL_TABLET | Freq: Every day | ORAL | Status: DC
  Administered 2015-08-20 – 2015-08-21 (×2): 1 mg via ORAL
  Filled 2015-08-20 (×5): qty 1

## 2015-08-20 MED ORDER — FESOTERODINE FUMARATE ER 8 MG PO TB24
8.0000 mg | ORAL_TABLET | Freq: Every day | ORAL | Status: DC
  Administered 2015-08-20 – 2015-08-23 (×4): 8 mg via ORAL
  Filled 2015-08-20 (×5): qty 1

## 2015-08-20 MED ORDER — ASPIRIN EC 81 MG PO TBEC
81.0000 mg | DELAYED_RELEASE_TABLET | Freq: Every day | ORAL | Status: DC
  Administered 2015-08-21 – 2015-08-23 (×2): 81 mg via ORAL
  Filled 2015-08-20 (×2): qty 1

## 2015-08-20 MED ORDER — ESCITALOPRAM OXALATE 10 MG PO TABS
10.0000 mg | ORAL_TABLET | Freq: Every day | ORAL | Status: DC
  Administered 2015-08-20 – 2015-08-23 (×4): 10 mg via ORAL
  Filled 2015-08-20 (×4): qty 1

## 2015-08-20 MED ORDER — FUROSEMIDE 10 MG/ML IJ SOLN
40.0000 mg | Freq: Once | INTRAMUSCULAR | Status: AC
  Administered 2015-08-20: 40 mg via INTRAVENOUS
  Filled 2015-08-20: qty 4

## 2015-08-20 MED ORDER — INSULIN ASPART 100 UNIT/ML ~~LOC~~ SOLN: 0.0000 [IU] | Freq: Every day | SUBCUTANEOUS | Status: DC

## 2015-08-20 MED ORDER — ENOXAPARIN SODIUM 60 MG/0.6ML ~~LOC~~ SOLN
60.0000 mg | SUBCUTANEOUS | Status: DC
  Administered 2015-08-20 – 2015-08-22 (×3): 60 mg via SUBCUTANEOUS
  Filled 2015-08-20 (×3): qty 0.6

## 2015-08-20 NOTE — Progress Notes (Addendum)
Patient Demographics:    Travis [name], is a 68 y.o. male, DOB - Jul 28, 1946, [id number]  Admit date - 08/19/2015   Admitting Physician Theressa Millard, MD  Outpatient Primary MD for the patient is Travis Huh, MD  LOS - 1   Chief Complaint  Patient presents with  . Fever  . Emesis        Subjective:    Travis [name] today has, No headache, No chest pain, No abdominal pain - No Nausea, No new weakness tingling or numbness, No Cough - SOB.     Assessment  & Plan :     1. Sepsis due to HCAP. Improved with present empiric anti-biotics which will be continued, monitor cultures, supportive care with oxygen and nebulizer treatments as needed. Gentle hydration. Overall appears much improved.   2. Questionable Chronic heart failure - echo from 2015 reviewed, EF is 60% without any LVH. Doubt this diagnosis. Repeat echo ordered.   3. Mild elevation in troponin. Non-ACS pattern rise in troponin, EKG nonacute, no chest pain, likely mild troponin rise from demand ischemia caused by sepsis. On aspirin. Blood pressure too low for beta blocker. Echo to evaluate wall motion and EF. Cardiology to see.  4. Chronic back pain. Supportive care.   5. Mild bradycardia. Check TSH, echo, avoid beta blocker or calcium channel blocker at this time.   6. ARF. Due to sepsis. Hold home dose Lasix and ACE inhibitor, hydrate, obtain bladder scan to rule out any obstruction.   7. Essential hypertension. Blood pressure on the softer side will hold any blood pressure medication.   8. Hypothyroidism. Home dose Synthroid resumed, check TSH as he is mildly bradycardic.   9. OSA. CPAP daily at bedtime.    10. DM type II. Check A1c, Amaryl at half home dose, sliding scale.   CBG (last 3)   Recent Labs  08/19/15 2347 08/20/15 0424 08/20/15 0800  GLUCAP 160* 140* 145*      Code Status : Full  Family Communication  : Wife  Disposition Plan  : Home 1-2 days  Consults  :  Cards  Procedures  :   TTE  DVT Prophylaxis  :  Lovenox    Lab Results  Component Value Date   PLT 132* 08/20/2015    Inpatient Medications  Scheduled Meds: . baclofen  5 mg Oral TID  . enoxaparin (LOVENOX) injection  30 mg Subcutaneous Q24H  . escitalopram  10 mg Oral Daily  . fesoterodine  8 mg Oral Daily  . gabapentin  100 mg Oral TID  . glimepiride  1 mg Oral Q breakfast  . insulin aspart  0-15 Units Subcutaneous TID WC  . insulin aspart  0-5 Units Subcutaneous QHS  . levothyroxine  175 mcg Oral Daily  . piperacillin-tazobactam (ZOSYN)  IV  3.375 g Intravenous 3 times per day  . polyethylene glycol  17 g Oral Daily  . pramipexole  0.5 mg Oral BID  . tamsulosin  0.4 mg Oral Daily  . vancomycin  1,000 mg Intravenous Q12H   Continuous Infusions: . sodium chloride     PRN Meds:.acetaminophen **OR** acetaminophen, albuterol, alum & mag hydroxide-simeth, HYDROmorphone (DILAUDID) injection, [DISCONTINUED] ondansetron **OR** ondansetron (ZOFRAN) IV, oxyCODONE  Antibiotics  :  Anti-infectives    Start     Dose/Rate Route Frequency Ordered Stop   08/20/15 0600  vancomycin (VANCOCIN) IVPB 1000 mg/200 mL premix     1,000 mg 200 mL/hr over 60 Minutes Intravenous Every 12 hours 08/19/15 1750     08/19/15 2200  piperacillin-tazobactam (ZOSYN) IVPB 3.375 g     3.375 g 12.5 mL/hr over 240 Minutes Intravenous 3 times per day 08/19/15 1750     08/19/15 1800  vancomycin (VANCOCIN) IVPB 1000 mg/200 mL premix  Status:  Discontinued    Comments:  Give this dose to make total 2g load.   1,000 mg 200 mL/hr over 60 Minutes Intravenous NOW 08/19/15 1749 08/19/15 1749   08/19/15 1800  vancomycin (VANCOCIN) 2,000 mg in sodium chloride 0.9 % 500 mL IVPB     2,000 mg 250 mL/hr over 120 Minutes Intravenous NOW  08/19/15 1750 08/19/15 2010   08/19/15 1715  piperacillin-tazobactam (ZOSYN) IVPB 3.375 g     3.375 g 100 mL/hr over 30 Minutes Intravenous  Once 08/19/15 1711 08/19/15 1912   08/19/15 1715  vancomycin (VANCOCIN) IVPB 1000 mg/200 mL premix  Status:  Discontinued     1,000 mg 200 mL/hr over 60 Minutes Intravenous  Once 08/19/15 1711 08/19/15 1749        Objective:   Filed Vitals:   08/20/15 0310 08/20/15 0425 08/20/15 0700 08/20/15 0734  BP:  108/52  117/52  Pulse: 51 50  54  Temp:  97.7 F (36.5 C) 98.4 F (36.9 C)   TempSrc:  Axillary Oral   Resp: 18 19  18   Height:      Weight:      SpO2: 99% 98%  96%    Wt Readings from Last 3 Encounters:  08/19/15 129.6 kg (285 lb 11.5 oz)  08/17/15 128.096 kg (282 lb 6.4 oz)  03/19/15 124.739 kg (275 lb)     Intake/Output Summary (Last 24 hours) at 08/20/15 1006 Last data filed at 08/20/15 0917  Gross per 24 hour  Intake      3 ml  Output      0 ml  Net      3 ml     Physical Exam  Awake Alert, Oriented X 3, No new F.N deficits, Normal affect [place].AT,PERRAL Supple Neck,No JVD, No cervical lymphadenopathy appriciated.  Symmetrical Chest wall movement, Good air movement bilaterally, CTAB RRR,No Gallops,Rubs or new Murmurs, No Parasternal Heave +ve B.Sounds, Abd Soft, No tenderness, No organomegaly appriciated, No rebound - guarding or rigidity. No Cyanosis, Clubbing or edema, No new Rash or bruise       Data Review:   Micro Results Recent Results (from the past 240 hour(s))  MRSA PCR Screening     Status: None   Collection Time: 08/20/15 12:12 AM  Result Value Ref Range Status   MRSA by PCR NEGATIVE NEGATIVE Final    Comment:        The GeneXpert MRSA Assay (FDA approved for NASAL specimens only), is one component of a comprehensive MRSA colonization surveillance program. It is not intended to diagnose MRSA infection nor to guide or monitor treatment for MRSA infections.     Radiology Reports Dg Chest 2  View  08/20/2015   CLINICAL DATA:  Shortness of breath this morning.  EXAM: CHEST  2 VIEW  COMPARISON:  08/19/2015  FINDINGS: The lungs are hypoinflated with mild elevation the right hemidiaphragm unchanged. There is mild right basilar opacification likely atelectasis. Interval resolution of previously noted left  perihilar/basilar opacification. Mild stable prominence of the cardiac silhouette. There is minimal calcified plaque over the aortic arch. Remainder of the exam is unchanged to include stabilization hardware over the lower thoracic spine as well as cervical spine.  IMPRESSION: Hypoinflation with mild right basilar opacification likely atelectasis. Resolved left perihilar/ basilar opacification.  Mild stable cardiomegaly.   Electronically Signed   By: Marin Olp M.D.   On: 08/20/2015 09:05   Dg Chest Port 1 View  08/19/2015   CLINICAL DATA:  Acute onset fever, chills and vomiting today.  EXAM: PORTABLE CHEST - 1 VIEW  COMPARISON:  Single view of the chest 07/15/2014 and 07/14/2012.  FINDINGS: There streaky airspace disease in the left lower lung zone. Lung volumes are low. Linear subsegmental atelectasis is seen in the right base. Mild asymmetric elevation of the right hemidiaphragm relative to the left is noted. Heart size is upper normal. No pneumothorax or pleural effusion.  IMPRESSION: Left basilar airspace disease is worrisome for pneumonia given symptoms although it could be atelectasis in this low volume chest.  Mild, subsegmental atelectasis right lung base is noted.   Electronically Signed   By: Inge Rise M.D.   On: 08/19/2015 17:54     CBC  Recent Labs Lab 08/19/15 1730 08/20/15 0256  WBC 7.0 12.2*  HGB 13.9 12.0*  HCT 41.5 36.2*  PLT 159 132*  MCV 94.1 96.5  MCH 31.5 32.0  MCHC 33.5 33.1  RDW 13.9 14.3  LYMPHSABS 0.5*  --   MONOABS 0.4  --   EOSABS 0.1  --   BASOSABS 0.0  --     Chemistries   Recent Labs Lab 08/17/15 1115 08/19/15 1730 08/20/15 0256  NA  144 142 145  K 4.4 4.3 4.0  CL 107 106 109  CO2 31 23 29   GLUCOSE 122* 175* 149*  BUN 32* 29* 30*  CREATININE 1.28 1.39* 1.69*  CALCIUM 9.5 9.1 8.5*  AST  --  31  --   ALT  --  36  --   ALKPHOS  --  83  --   BILITOT  --  0.6  --    ------------------------------------------------------------------------------------------------------------------ estimated creatinine clearance is 57.4 mL/min (by C-G formula based on Cr of 1.69). ------------------------------------------------------------------------------------------------------------------ No results for input(s): HGBA1C in the last 72 hours. ------------------------------------------------------------------------------------------------------------------ No results for input(s): CHOL, HDL, LDLCALC, TRIG, CHOLHDL, LDLDIRECT in the last 72 hours. ------------------------------------------------------------------------------------------------------------------ No results for input(s): TSH, T4TOTAL, T3FREE, THYROIDAB in the last 72 hours.  Invalid input(s): FREET3 ------------------------------------------------------------------------------------------------------------------ No results for input(s): VITAMINB12, FOLATE, FERRITIN, TIBC, IRON, RETICCTPCT in the last 72 hours.  Coagulation profile  Recent Labs Lab 08/19/15 2133  INR 1.18    No results for input(s): DDIMER in the last 72 hours.  Cardiac Enzymes  Recent Labs Lab 08/19/15 2200 08/20/15 0256 08/20/15 0833  TROPONINI 0.34* 0.42* 0.29*   ------------------------------------------------------------------------------------------------------------------ Invalid input(s): POCBNP   Time Spent in minutes   35   Sigmund Morera K M.D on 08/20/2015 at 10:06 AM  Between 7am to 7pm - Pager - [phone]  After 7pm go to [email or web address] - password [facility]  [facility] -  Office  [phone]

## 2015-08-20 NOTE — Consult Note (Signed)
Admit date: 08/19/2015 Referring Physician  [facility] Primary Physician Simona Huh, MD Primary Cardiologist: Dr. Acie Fredrickson Reason for Consultation: Elevated troponins  HPI: Mr. Fairfax is a 69 yo male with PMHx of HTN, OSA on CPAP at home, chronic systolic CHF, [id number], and hypothyroidism who presented to the ED on 08/19/15 with complaint of shortness of breath, nausea and fever.  In the ED, patient was febrile up to 104.2, severely hypertensive at 211/77, tachycardic at 121, satting 90% on 4 L [place]. No leukocytosis initially. CXR showed left basilar pneumonia, elevated lactic acid at 2.29, UA showed moderate leukocytes, negative nitrites, few bacteria.  Patient was admitted for sepsis likely secondary to CAP versus UTI and was started on IVF, vancomycin and zosyn.   Patient has been afebrile for 12 hours and is now normotensive, but continues to require [place] 4 L for adequate oxygenation. Patient's troponins trended 0.05>0.34>0.42>0.29. Patient admits to shortness of breath and nausea, but denies any chest pain, diaphoresis, lightheadedness. He denies any history of chest pain, DOE, h/o CAD or MI. Patient does have a history of tobacco abuse (40 pack year history) and a family history of CAD. EKG from this morning is without signs of acute ischemia, unchanged from prior EKGs in March 2016; however, EKG on admission is questionable for ST depression in anterior leads. Cardiology was consulted for possible NSTEMI.   Myoview 02/2015: Low risk. No evidence of ischemia. LVEF 48%.  Echo 06/2014: EF 60-65%.  PMH:   Past Medical History  Diagnosis Date  . PONV (postoperative nausea and vomiting)   . Hypertension   . DJD (degenerative joint disease)     WHOLE SPINE  . Hyperthyroidism     Had Iodine to resolve issue DX 10 YR AGO  . Hypothyroidism     Low d/t treatment of hyperthyroidism  . Sleep apnea     DX  2009/2010 [facility]  . Type II diabetes mellitus     diet controlled  .  Heart murmur     Evaluated by cardiologist 4-5 years ago but does not currently see a cardiologist  . Gait disorder   . Cervical myelopathy     with myelomalacia at C5-6 and C3-4  . RLS (restless legs syndrome)   . Peripheral neuropathy   . 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 Constipation - functional     controlled with miralax  . RBBB   . 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 CHF (congestive heart failure)   . Pneumonia   . Restless legs syndrome 03/14/2015  . Renal disorder     acute kidney failure    PSH:   Past Surgical History  Procedure Laterality Date  . Cholecystectomy    . Back surgery      6 surgeries  . Lumbar laminectomy/decompression microdiscectomy  07/14/2012    Procedure: LUMBAR LAMINECTOMY/DECOMPRESSION MICRODISCECTOMY 2 LEVELS;  Surgeon: Eustace Moore, MD;  Location: [place] [facility];  Service: Neurosurgery;  Laterality: Left;  Lumbar two three laminectomy, left thoracic four five transpedicular diskectomy   Allergies:  Other Prior to Admit Meds:   Prior to Admission medications   Medication Sig Start Date End Date Taking? Authorizing Provider  baclofen (LIORESAL) 10 MG tablet Take 1 tablet (10 mg total) by mouth 3 (three) times daily. For muscle spasms 10/30/14  Yes Meredith Staggers, MD  diclofenac (VOLTAREN) 75 MG EC tablet Take 75 mg by mouth daily. 07/14/15  Yes Historical Provider,  MD  escitalopram (LEXAPRO) 10 MG tablet Take 10 mg by mouth daily. 07/12/15  Yes Historical Provider, MD  furosemide (LASIX) 40 MG tablet Take 40 mg by mouth 2 (two) times daily.   Yes Historical Provider, MD  gabapentin (NEURONTIN) 400 MG capsule TAKE 1 CAPSULE (400 MG TOTAL) BY MOUTH EVERY 8 (EIGHT) HOURS. 04/16/15  Yes Meredith Staggers, MD  glimepiride (AMARYL) 2 MG tablet Take 2 mg by mouth daily with breakfast.   Yes Historical Provider, MD  KLOR-CON 10 10 MEQ tablet TAKE 1 TABLET (10 MEQ TOTAL) BY MOUTH DAILY. 03/27/15  Yes [name], MD  levothyroxine (SYNTHROID,  LEVOTHROID) 175 MCG tablet Take 175 mcg by mouth daily. 08/12/15  Yes Historical Provider, MD  losartan (COZAAR) 100 MG tablet Take 1 tablet (100 mg total) by mouth daily. 03/14/15  Yes [name], MD  pramipexole (MIRAPEX) 0.5 MG tablet Take 0.5 mg by mouth 2 (two) times daily.  08/09/14  Yes Ivan Anchors Love, [name]  tamsulosin (FLOMAX) 0.4 MG CAPS capsule Take 0.4 mg by mouth daily.  09/13/14  Yes Historical Provider, MD  TOVIAZ 8 MG TB24 tablet Take 8 mg by mouth daily. 08/07/15  Yes Historical Provider, MD  polyethylene glycol (MIRALAX / GLYCOLAX) packet Take 17 g by mouth daily as needed. For constipation 08/09/14   Ivan Anchors Love, [name]   Fam HX:    Family History  Problem Relation Age of Onset  . Polycythemia Mother   . Diabetes Father   . Hypertension Father   . Heart disease Father   . CAD      1 of his 4 brothers and father has CAD   Social HX:    Social History   Social History  . Marital Status: Married    Spouse Name: [name]  . Number of Children: 3  . Years of Education: 16   Occupational History  .      Works in [place]  . Smoking status: Former Smoker -- 2.00 packs/day for 20 years    Types: Cigarettes    Quit date: 06/28/1985  . Smokeless tobacco: Former Systems developer    Quit date: 06/28/1985  . Alcohol Use: No  . Drug Use: No  . Sexual Activity: Not on file   Other Topics Concern  . Not on file   Social History Narrative   Patient is married with 3 children.   Patient has 16 yrs of education.   Patient is right handed.   Patient drinks 2 cups of caffeine per day.        ROS:   General: Admits to fatigue. Denies fever, chills, change in appetite and diaphoresis.  Respiratory: Admits to SOB and cough. Denies DOE, chest tightness, and wheezing.   Cardiovascular: Denies chest pain and palpitations.  Gastrointestinal: Denies nausea, vomiting, abdominal pain, diarrhea, constipation Genitourinary: Denies dysuria, urgency,  suprapubic pain and flank pain. Skin: Denies pallor, rash and wounds.  Neurological: Denies dizziness, headaches, weakness, lightheadedness  Filed Vitals:   08/20/15 0310 08/20/15 0425 08/20/15 0700 08/20/15 0734  BP:  108/52  117/52  Pulse: 51 50  54  Temp:  97.7 F (36.5 C) 98.4 F (36.9 C)   TempSrc:  Axillary Oral   Resp: 18 19  18   Height:      Weight:      SpO2: 99% 98%  96%   General: Vital signs reviewed.  Patient is well-developed and well-nourished, in no acute  distress and cooperative with exam.  Neck: Supple, no JVD, or carotid bruit present.  Cardiovascular: Bradycardic, regular rhythm, S1 normal, S2 normal Pulmonary/Chest: Mild inspiratory rales in lower left lung field, no wheezes, or rhonchi. Abdominal: Soft, non-tender, non-distended, BS + Extremities: 1+ pitting lower extremity edema bilaterally, pulses symmetric and intact bilaterally.  Neurological: A&O x3 Skin: Warm, dry and intact. No rashes or erythema. Psychiatric: Normal mood and affect. speech and behavior is normal. Cognition and memory are normal.      Labs: Lab Results  Component Value Date   WBC 12.2* 08/20/2015   HGB 12.0* 08/20/2015   HCT 36.2* 08/20/2015   MCV 96.5 08/20/2015   PLT 132* 08/20/2015     Recent Labs Lab 08/19/15 1730 08/20/15 0256  NA 142 145  K 4.3 4.0  CL 106 109  CO2 23 29  BUN 29* 30*  CREATININE 1.39* 1.69*  CALCIUM 9.1 8.5*  PROT 7.2  --   BILITOT 0.6  --   ALKPHOS 83  --   ALT 36  --   AST 31  --   GLUCOSE 175* 149*    Recent Labs  08/19/15 1730 08/19/15 2200 08/20/15 0256 08/20/15 0833  TROPONINI 0.05* 0.34* 0.42* 0.29*   Lab Results  Component Value Date   TRIG 145 07/18/2014   No results found for: DDIMER   Radiology:  Dg Chest 2 View  08/20/2015   CLINICAL DATA:  Shortness of breath this morning.  EXAM: CHEST  2 VIEW  COMPARISON:  08/19/2015  FINDINGS: The lungs are hypoinflated with mild elevation the right hemidiaphragm unchanged.  There is mild right basilar opacification likely atelectasis. Interval resolution of previously noted left perihilar/basilar opacification. Mild stable prominence of the cardiac silhouette. There is minimal calcified plaque over the aortic arch. Remainder of the exam is unchanged to include stabilization hardware over the lower thoracic spine as well as cervical spine.  IMPRESSION: Hypoinflation with mild right basilar opacification likely atelectasis. Resolved left perihilar/ basilar opacification.  Mild stable cardiomegaly.   Electronically Signed   By: Marin Olp M.D.   On: 08/20/2015 09:05   Dg Chest Port 1 View  08/19/2015   CLINICAL DATA:  Acute onset fever, chills and vomiting today.  EXAM: PORTABLE CHEST - 1 VIEW  COMPARISON:  Single view of the chest 07/15/2014 and 07/14/2012.  FINDINGS: There streaky airspace disease in the left lower lung zone. Lung volumes are low. Linear subsegmental atelectasis is seen in the right base. Mild asymmetric elevation of the right hemidiaphragm relative to the left is noted. Heart size is upper normal. No pneumothorax or pleural effusion.  IMPRESSION: Left basilar airspace disease is worrisome for pneumonia given symptoms although it could be atelectasis in this low volume chest.  Mild, subsegmental atelectasis right lung base is noted.   Electronically Signed   By: Inge Rise M.D.   On: 08/19/2015 17:54   Personally viewed.  EKG 08/19/15: Sinus rhythm, RBBB, ST depression in anterior leads.  EKG 08/20/15:  Sinus rhythm, RBBB, no acute signs of ischemia, unchanged from EKG in 02/2015.  Personally viewed.   ASSESSMENT/PLAN:    Demand Ischemia: Cardiology consulted for concern of NSTEMI. Troponins have trended 0.05>0.34>0.42>0.29. No associated chest pain. EKG from this morning is without signs of acute ischemia, unchanged from prior EKGs in March 2016; however, EKG on admission is notable for slight ST depressions in anterior leads. TIMI score 3-4, heart  score 7. Elevated troponins are likely secondary to demand ischemia in  the setting of sepsis as patient was hypertensive and tachycardic yesterday. We are not concerned for an acute ischemic event/plaque rupture; therefore, we will not start patient on Heparin. We will continue medical management with BP control and ASA daily. Patient is not on statin at home, no records of lipid panel- will check lipid panel. Given that this is the patient's second time having increased troponins during sepsis in the last 1 year, patient should have a cath to evaluate for CAD. Would favor waiting a few days until kidney function has improved.  -Continue trending troponins -Telemetry -Repeat EKG tomorrow am -Continue ASA 81 mg daily -Check lipid panel- start on statin accordingly -Hold off on BB given bradycardia -Consider adding back losartan 100 mg daily once kidney function improves -Consider LHC in a few days once kidney function improves to evaluate for CAD  Osa Craver, DO PGY-2 Internal Medicine Resident Pager # [phone] 08/20/2015 11:14 AM     Attending Note:   The patient was seen and examined.  Agree with assessment and plan as noted above.  Changes made to the above note as needed.  [name] was fine on Friday but presented last night with temp of 104.5.   Had respiratory distress, marked HTN and now has mildly elevated troponin.  He denies and chest pain.   His ECG from last night shows some ST depression in the anterior leads - new from previous tracing  He is feeling better this am compared to last night. Will continue current meds Losartan is on hold since his creatinine has increased slightly We can use hydralazine if needed for BP control CXR shows pneumonia  This is the 2nd episode of acute diastolic dysfunction in the setting of pneumonia /sepsis. He has had + troponins each time I think that we should consider cardiac cath once his renal function improves.  I''ve discussed this  with [name] and Seth Bake ( daughter in law) who agree.    Nahser, Wonda Cheng, MD  08/20/2015 9:17 PM    [facility],  [place], [place]  [address] Pager [phone] Phone: [phone]; Fax: [phone]   [facility]  [address] [place], [place]  [address] [phone]   Fax [phone]     [name], [name], MD, [facility] 08/20/2015, 6:10 PM [address]  [place] Pager [phone]

## 2015-08-20 NOTE — Progress Notes (Signed)
Pt asked to be taken off of BiPAP. He is on 4L O2 via [place]  stating in the mid 90's. Pt's mouth is swabbed and he has no sign of distress. [facility] is aware of pt's troponin's. Pt is asymptomatic at this time. Will continue to monitor.

## 2015-08-20 NOTE — Progress Notes (Signed)
Went to check on patient after giving breathing treatment for SOB and low SPO2 at earlier time. Patient is up in the chair on 5 LPM nasal cannula. States that he feels better than he did earlier. Daughter in law states that he doesn't look as good as he has been. BBS are clear in the upper and diminished in the bases. SPO2 has been up and down from 91% to 97%. Patient also stated that he has been anxious ever since he was last hospitalized for pneumonia and CHF. RN at bedside called MD. Vitals stable. No distress noted. Patient able to speak in full complete sentences.

## 2015-08-21 ENCOUNTER — Inpatient Hospital Stay (HOSPITAL_COMMUNITY): Payer: Medicare Other

## 2015-08-21 DIAGNOSIS — I509 Heart failure, unspecified: Secondary | ICD-10-CM

## 2015-08-21 DIAGNOSIS — I5033 Acute on chronic diastolic (congestive) heart failure: Secondary | ICD-10-CM

## 2015-08-21 LAB — CBC
HCT: 35.3 % — ABNORMAL LOW (ref 39.0–52.0)
Hemoglobin: 11.6 g/dL — ABNORMAL LOW (ref 13.0–17.0)
MCH: 31.5 pg (ref 26.0–34.0)
MCHC: 32.9 g/dL (ref 30.0–36.0)
MCV: 95.9 fL (ref 78.0–100.0)
PLATELETS: 146 10*3/uL — AB (ref 150–400)
RBC: 3.68 MIL/uL — ABNORMAL LOW (ref 4.22–5.81)
RDW: 14.3 % (ref 11.5–15.5)
WBC: 9.1 10*3/uL (ref 4.0–10.5)

## 2015-08-21 LAB — GLUCOSE, CAPILLARY
GLUCOSE-CAPILLARY: 104 mg/dL — AB (ref 65–99)
GLUCOSE-CAPILLARY: 138 mg/dL — AB (ref 65–99)
GLUCOSE-CAPILLARY: 163 mg/dL — AB (ref 65–99)
Glucose-Capillary: 198 mg/dL — ABNORMAL HIGH (ref 65–99)

## 2015-08-21 LAB — TROPONIN I: Troponin I: 0.19 ng/mL — ABNORMAL HIGH (ref <0.031)

## 2015-08-21 LAB — HEMOGLOBIN A1C
HEMOGLOBIN A1C: 6.7 % — AB (ref 4.8–5.6)
Mean Plasma Glucose: 146 mg/dL

## 2015-08-21 LAB — BASIC METABOLIC PANEL
Anion gap: 7 (ref 5–15)
BUN: 23 mg/dL — AB (ref 6–20)
CALCIUM: 8.3 mg/dL — AB (ref 8.9–10.3)
CO2: 27 mmol/L (ref 22–32)
CREATININE: 1.38 mg/dL — AB (ref 0.61–1.24)
Chloride: 107 mmol/L (ref 101–111)
GFR calc Af Amer: 58 mL/min — ABNORMAL LOW (ref >60)
GFR, EST NON AFRICAN AMERICAN: 50 mL/min — AB (ref >60)
GLUCOSE: 162 mg/dL — AB (ref 65–99)
POTASSIUM: 3.9 mmol/L (ref 3.5–5.1)
SODIUM: 141 mmol/L (ref 135–145)

## 2015-08-21 LAB — URINE CULTURE

## 2015-08-21 MED ORDER — SODIUM CHLORIDE 0.9 % IV SOLN: 250.0000 mL | INTRAVENOUS | Status: DC | PRN

## 2015-08-21 MED ORDER — SODIUM CHLORIDE 0.9 % WEIGHT BASED INFUSION: 1.0000 mL/kg/h | INTRAVENOUS | Status: DC

## 2015-08-21 MED ORDER — ZOLPIDEM TARTRATE 5 MG PO TABS
5.0000 mg | ORAL_TABLET | Freq: Every evening | ORAL | Status: DC | PRN
  Administered 2015-08-21 – 2015-08-22 (×2): 5 mg via ORAL
  Filled 2015-08-21 (×2): qty 1

## 2015-08-21 MED ORDER — HYDRALAZINE HCL 25 MG PO TABS
25.0000 mg | ORAL_TABLET | Freq: Once | ORAL | Status: AC
  Administered 2015-08-21: 25 mg via ORAL

## 2015-08-21 MED ORDER — SODIUM CHLORIDE 0.9 % IJ SOLN: 3.0000 mL | INTRAMUSCULAR | Status: DC | PRN

## 2015-08-21 MED ORDER — BISACODYL 10 MG RE SUPP
10.0000 mg | Freq: Every day | RECTAL | Status: DC
  Filled 2015-08-21: qty 1

## 2015-08-21 MED ORDER — SODIUM CHLORIDE 0.9 % WEIGHT BASED INFUSION: 3.0000 mL/kg/h | INTRAVENOUS | Status: DC

## 2015-08-21 MED ORDER — INSULIN ASPART 100 UNIT/ML ~~LOC~~ SOLN
3.0000 [IU] | Freq: Three times a day (TID) | SUBCUTANEOUS | Status: DC
  Administered 2015-08-21 – 2015-08-22 (×3): 3 [IU] via SUBCUTANEOUS

## 2015-08-21 MED ORDER — DIPHENHYDRAMINE HCL 25 MG PO CAPS: 25.0000 mg | ORAL_CAPSULE | Freq: Every evening | ORAL | Status: DC | PRN

## 2015-08-21 MED ORDER — FUROSEMIDE 10 MG/ML IJ SOLN
40.0000 mg | Freq: Once | INTRAMUSCULAR | Status: AC
  Administered 2015-08-21: 40 mg via INTRAVENOUS
  Filled 2015-08-21: qty 4

## 2015-08-21 MED ORDER — POTASSIUM CHLORIDE CRYS ER 20 MEQ PO TBCR
40.0000 meq | EXTENDED_RELEASE_TABLET | Freq: Once | ORAL | Status: AC
  Administered 2015-08-21: 40 meq via ORAL
  Filled 2015-08-21: qty 2

## 2015-08-21 MED ORDER — HYDRALAZINE HCL 25 MG PO TABS
25.0000 mg | ORAL_TABLET | Freq: Three times a day (TID) | ORAL | Status: DC
  Administered 2015-08-21 – 2015-08-23 (×7): 25 mg via ORAL
  Filled 2015-08-21 (×7): qty 1

## 2015-08-21 MED ORDER — ASPIRIN 81 MG PO CHEW
81.0000 mg | CHEWABLE_TABLET | ORAL | Status: DC
  Filled 2015-08-21: qty 1

## 2015-08-21 MED ORDER — SODIUM CHLORIDE 0.9 % IJ SOLN
3.0000 mL | Freq: Two times a day (BID) | INTRAMUSCULAR | Status: DC
  Administered 2015-08-21 (×2): 3 mL via INTRAVENOUS

## 2015-08-21 NOTE — Progress Notes (Signed)
Pt's daughter in law called me last night to report that Travis [name] seemed to have more difficulty breathing. I reviewed the notes and discussed with Dr. Alva Garnet at [facility]. We gave Lasix 40 mg iv x 1  Will reassess in the am     Travis [name], Wonda Cheng, MD  08/21/2015 5:07 AM    [facility] [place],  [place], [place]  [address] Pager [phone] Phone: [phone]; Fax: [phone]   [facility]  [address] [place], [place]  [address] [phone]   Fax [phone]

## 2015-08-21 NOTE — Progress Notes (Signed)
  Echocardiogram 2D Echocardiogram has been performed.  Travis [name] 08/21/2015, 12:08 PM

## 2015-08-21 NOTE — Progress Notes (Signed)
Patient Demographics:    Travis [name], is a 69 y.o. male, DOB - Jan 30, 1946, [id number]  Admit date - 08/19/2015   Admitting Physician Theressa Millard, MD  Outpatient Primary MD for the patient is Simona Huh, MD  LOS - 2   Chief Complaint  Patient presents with  . Fever  . Emesis        Subjective:    Travis [name] today has, No headache, No chest pain, No abdominal pain - No Nausea, No new weakness tingling or numbness, No Cough - SOB.     Assessment  & Plan :     1. Sepsis due to HCAP. Improved with present empiric anti-biotics which will be continued, monitor cultures, supportive care with oxygen and nebulizer treatments as needed. Gentle hydration. Overall appears much improved.   2. Questionable Chronic heart failure - echo from 2015 reviewed, EF is 60% without any LVH. Doubt this diagnosis. Cards following likely L&R Heart Cath soon.   3. Mild elevation in troponin. Non-ACS pattern rise in troponin, EKG nonacute, no chest pain, likely mild troponin rise from demand ischemia caused by sepsis. On aspirin. Blood pressure too low for beta blocker. Echo to evaluate wall motion and EF. Cardiology following.   4. Chronic back pain. Supportive care.   5. Mild bradycardia. Mildly low TSH most likely sick euthyroid, cardiology following, he is asymptomatic, avoid beta blocker or calcium channel blocker at this time.   6. ARF. Due to sepsis. Hold home dose Lasix and ACE inhibitor, hydrate, stable bladder scan .   7. Essential hypertension. Blood pressure on the softer side will hold any blood pressure medication.   8. Hypothyroidism. Home dose Synthroid resumed, mildly low TSH suggests sick euthyroid syndrome.   9. OSA. CPAP daily at bedtime.    10. DM type II. Check A1c,  Amaryl continue present dose, add pre-meal NovoLog along with sliding scale.   CBG (last 3)   Recent Labs  08/20/15 1932 08/20/15 2338 08/21/15 0805  GLUCAP 201* 149* 198*      Code Status : Full  Family Communication  : Wife  Disposition Plan  : Home 1-2 days  Consults  :  Cards  Procedures  :      DVT Prophylaxis  :  Lovenox    Lab Results  Component Value Date   PLT 146* 08/21/2015    Inpatient Medications  Scheduled Meds: . antiseptic oral rinse  7 mL Mouth Rinse BID  . [START ON 08/22/2015] aspirin  81 mg Oral Pre-Cath  . aspirin EC  81 mg Oral Daily  . baclofen  5 mg Oral TID  . bisacodyl  10 mg Rectal Daily  . enoxaparin (LOVENOX) injection  60 mg Subcutaneous Q24H  . escitalopram  10 mg Oral Daily  . fesoterodine  8 mg Oral Daily  . gabapentin  100 mg Oral TID  . glimepiride  1 mg Oral Q breakfast  . hydrALAZINE  25 mg Oral 3 times per day  . insulin aspart  0-15 Units Subcutaneous TID WC  . insulin aspart  0-5 Units Subcutaneous QHS  . levothyroxine  175 mcg Oral Daily  . piperacillin-tazobactam (ZOSYN)  IV  3.375 g Intravenous 3 times per day  . pramipexole  0.5 mg Oral BID  . sodium chloride  3 mL Intravenous Q12H  . tamsulosin  0.4 mg Oral Daily  . vancomycin  1,000 mg Intravenous Q12H   Continuous Infusions:   PRN Meds:.sodium chloride, acetaminophen **OR** [DISCONTINUED] acetaminophen, albuterol, alum & mag hydroxide-simeth, HYDROmorphone (DILAUDID) injection, [DISCONTINUED] ondansetron **OR** ondansetron (ZOFRAN) IV, oxyCODONE, polyethylene glycol, sodium chloride  Antibiotics  :    Anti-infectives    Start     Dose/Rate Route Frequency Ordered Stop   08/20/15 0600  vancomycin (VANCOCIN) IVPB 1000 mg/200 mL premix     1,000 mg 200 mL/hr over 60 Minutes Intravenous Every 12 hours 08/19/15 1750     08/19/15 2200  piperacillin-tazobactam (ZOSYN) IVPB 3.375 g     3.375 g 12.5 mL/hr over 240 Minutes Intravenous 3 times per day 08/19/15  1750     08/19/15 1800  vancomycin (VANCOCIN) IVPB 1000 mg/200 mL premix  Status:  Discontinued    Comments:  Give this dose to make total 2g load.   1,000 mg 200 mL/hr over 60 Minutes Intravenous NOW 08/19/15 1749 08/19/15 1749   08/19/15 1800  vancomycin (VANCOCIN) 2,000 mg in sodium chloride 0.9 % 500 mL IVPB     2,000 mg 250 mL/hr over 120 Minutes Intravenous NOW 08/19/15 1750 08/19/15 2010   08/19/15 1715  piperacillin-tazobactam (ZOSYN) IVPB 3.375 g     3.375 g 100 mL/hr over 30 Minutes Intravenous  Once 08/19/15 1711 08/19/15 1912   08/19/15 1715  vancomycin (VANCOCIN) IVPB 1000 mg/200 mL premix  Status:  Discontinued     1,000 mg 200 mL/hr over 60 Minutes Intravenous  Once 08/19/15 1711 08/19/15 1749        Objective:   Filed Vitals:   08/20/15 2224 08/20/15 2337 08/21/15 0415 08/21/15 0753  BP: 160/53  147/55 153/54  Pulse:  84 75 64  Temp: 97.5 F (36.4 C)  97.6 F (36.4 C) 98.2 F (36.8 C)  TempSrc: Oral  Axillary Oral  Resp:  20 24 32  Height:      Weight:      SpO2:  92% 96% 95%    Wt Readings from Last 3 Encounters:  08/19/15 129.6 kg (285 lb 11.5 oz)  08/17/15 128.096 kg (282 lb 6.4 oz)  03/19/15 124.739 kg (275 lb)     Intake/Output Summary (Last 24 hours) at 08/21/15 1146 Last data filed at 08/21/15 1100  Gross per 24 hour  Intake   2630 ml  Output   3525 ml  Net   -895 ml     Physical Exam  Awake Alert, Oriented X 3, No new F.N deficits, Normal affect [place].AT,PERRAL Supple Neck,No JVD, No cervical lymphadenopathy appriciated.  Symmetrical Chest wall movement, Good air movement bilaterally, CTAB RRR,No Gallops,Rubs or new Murmurs, No Parasternal Heave +ve B.Sounds, Abd Soft, No tenderness, No organomegaly appriciated, No rebound - guarding or rigidity. No Cyanosis, Clubbing or edema, No new Rash or bruise       Data Review:   Micro Results Recent Results (from the past 240 hour(s))  Blood Culture (routine x 2)     Status: None  (Preliminary result)   Collection Time: 08/19/15  5:30 PM  Result Value Ref Range Status   Specimen Description BLOOD RIGHT HAND  Final   Special Requests BOTTLES DRAWN AEROBIC AND ANAEROBIC 5CC  Final   Culture NO GROWTH < 24 HOURS  Final   Report Status PENDING  Incomplete  Blood Culture (routine x 2)  Status: None (Preliminary result)   Collection Time: 08/19/15  5:40 PM  Result Value Ref Range Status   Specimen Description BLOOD LEFT HAND  Final   Special Requests BOTTLES DRAWN AEROBIC AND ANAEROBIC 5CC  Final   Culture NO GROWTH < 24 HOURS  Final   Report Status PENDING  Incomplete  Urine culture     Status: None (Preliminary result)   Collection Time: 08/19/15  6:16 PM  Result Value Ref Range Status   Specimen Description URINE, CLEAN CATCH  Final   Special Requests NONE  Final   Culture CULTURE REINCUBATED FOR BETTER GROWTH  Final   Report Status PENDING  Incomplete  MRSA PCR Screening     Status: None   Collection Time: 08/20/15 12:12 AM  Result Value Ref Range Status   MRSA by PCR NEGATIVE NEGATIVE Final    Comment:        The GeneXpert MRSA Assay (FDA approved for NASAL specimens only), is one component of a comprehensive MRSA colonization surveillance program. It is not intended to diagnose MRSA infection nor to guide or monitor treatment for MRSA infections.     Radiology Reports Dg Chest 2 View  08/20/2015   CLINICAL DATA:  Shortness of breath this morning.  EXAM: CHEST  2 VIEW  COMPARISON:  08/19/2015  FINDINGS: The lungs are hypoinflated with mild elevation the right hemidiaphragm unchanged. There is mild right basilar opacification likely atelectasis. Interval resolution of previously noted left perihilar/basilar opacification. Mild stable prominence of the cardiac silhouette. There is minimal calcified plaque over the aortic arch. Remainder of the exam is unchanged to include stabilization hardware over the lower thoracic spine as well as cervical spine.   IMPRESSION: Hypoinflation with mild right basilar opacification likely atelectasis. Resolved left perihilar/ basilar opacification.  Mild stable cardiomegaly.   Electronically Signed   By: Marin Olp M.D.   On: 08/20/2015 09:05   Dg Chest Port 1 View  08/21/2015   CLINICAL DATA:  Shortness of breath.  EXAM: PORTABLE CHEST - 1 VIEW  COMPARISON:  08/20/2015  FINDINGS: Mediastinum and hilar structures are normal. Bibasilar subsegmental atelectasis and/or infiltrates present. These are progressive from prior exam. No pleural effusion or pneumothorax. Heart size normal. Prior cervical thoracic spine fusion.  IMPRESSION: Progressive bibasilar atelectasis and/or infiltrates. Bibasilar pneumonia cannot be excluded.   Electronically Signed   By: Marcello Moores  Register   On: 08/21/2015 07:52   Dg Chest Port 1 View  08/19/2015   CLINICAL DATA:  Acute onset fever, chills and vomiting today.  EXAM: PORTABLE CHEST - 1 VIEW  COMPARISON:  Single view of the chest 07/15/2014 and 07/14/2012.  FINDINGS: There streaky airspace disease in the left lower lung zone. Lung volumes are low. Linear subsegmental atelectasis is seen in the right base. Mild asymmetric elevation of the right hemidiaphragm relative to the left is noted. Heart size is upper normal. No pneumothorax or pleural effusion.  IMPRESSION: Left basilar airspace disease is worrisome for pneumonia given symptoms although it could be atelectasis in this low volume chest.  Mild, subsegmental atelectasis right lung base is noted.   Electronically Signed   By: Inge Rise M.D.   On: 08/19/2015 17:54     CBC  Recent Labs Lab 08/19/15 1730 08/20/15 0256 08/21/15 0145  WBC 7.0 12.2* 9.1  HGB 13.9 12.0* 11.6*  HCT 41.5 36.2* 35.3*  PLT 159 132* 146*  MCV 94.1 96.5 95.9  MCH 31.5 32.0 31.5  MCHC 33.5 33.1 32.9  RDW  13.9 14.3 14.3  LYMPHSABS 0.5*  --   --   MONOABS 0.4  --   --   EOSABS 0.1  --   --   BASOSABS 0.0  --   --     Chemistries   Recent  Labs Lab 08/17/15 1115 08/19/15 1730 08/20/15 0256 08/21/15 0145  NA 144 142 145 141  K 4.4 4.3 4.0 3.9  CL 107 106 109 107  CO2 31 23 29 27   GLUCOSE 122* 175* 149* 162*  BUN 32* 29* 30* 23*  CREATININE 1.28 1.39* 1.69* 1.38*  CALCIUM 9.5 9.1 8.5* 8.3*  AST  --  31  --   --   ALT  --  36  --   --   ALKPHOS  --  83  --   --   BILITOT  --  0.6  --   --    ------------------------------------------------------------------------------------------------------------------ estimated creatinine clearance is 70.3 mL/min (by C-G formula based on Cr of 1.38). ------------------------------------------------------------------------------------------------------------------  Recent Labs  08/20/15 0256  HGBA1C 6.7*   ------------------------------------------------------------------------------------------------------------------  Recent Labs  08/20/15 1458  CHOL 124  HDL 29*  LDLCALC 79  TRIG 82  CHOLHDL 4.3   ------------------------------------------------------------------------------------------------------------------  Recent Labs  08/20/15 1030  TSH 0.299*   ------------------------------------------------------------------------------------------------------------------ No results for input(s): VITAMINB12, FOLATE, FERRITIN, TIBC, IRON, RETICCTPCT in the last 72 hours.  Coagulation profile  Recent Labs Lab 08/19/15 2133  INR 1.18    No results for input(s): DDIMER in the last 72 hours.  Cardiac Enzymes  Recent Labs Lab 08/20/15 1458 08/20/15 2124 08/21/15 0145  TROPONINI 0.20* 0.17* 0.19*   ------------------------------------------------------------------------------------------------------------------ Invalid input(s): POCBNP   Time Spent in minutes   35   Caniyah Murley K M.D on 08/21/2015 at 11:46 AM  Between 7am to 7pm - Pager - [phone]  After 7pm go to [email or web address] - password [facility]  [facility] -  Office  [phone]

## 2015-08-21 NOTE — Evaluation (Signed)
Physical Therapy Evaluation Patient Details Name: SUMPTER KICE MRN: [id number] DOB: [birth year] Today's Date: 08/21/2015   History of Present Illness  Cristen P Lybeck is a 69 y.o. male with a history of combined Systolic and diastolic CHF, HTN, DM2 and OSA who presents to the Ed with complaints of fevers and chills, SOB and nausea and vomiting that started today. He had a fever to 104.4. He was found to have hypoxia when he arrived to the ED and was placed on BiPAP. Chest X-Ray revealed a Left Basilar Pneumonia and he was found to have an elevated lactic acid and the Sepsis Protocol was initiated. IV Vancomycin and Zosyn was started and he was referred for admission.   Clinical Impression  Pt admitted with/for PNA, fever, SOB.  Pt currently limited functionally due to the problems listed below.  (see problems list.)  Pt will benefit from PT to maximize function and safety to be able to get home safely with available assist of family.     Follow Up Recommendations Outpatient PT;Supervision for mobility/OOB    Equipment Recommendations  None recommended by PT    Recommendations for Other Services       Precautions / Restrictions Precautions Precautions: Fall Restrictions Weight Bearing Restrictions: No      Mobility  Bed Mobility               General bed mobility comments: not tested  Transfers Overall transfer level: Needs assistance   Transfers: Sit to/from Stand Sit to Stand: Min assist         General transfer comment: assisted mostly to come forward over BOS  Ambulation/Gait Ambulation/Gait assistance: Min guard Ambulation Distance (Feet): 170 Feet Assistive device: Rolling walker (2 wheeled) Gait Pattern/deviations: Step-through pattern Gait velocity: slow Gait velocity interpretation: Below normal speed for age/gender General Gait Details: shuffle gait on L throughout due to weak df/foot drop L.  Right started dragging with  fatigue.  Stairs            Wheelchair Mobility    Modified Rankin (Stroke Patients Only)       Balance Overall balance assessment: Needs assistance Sitting-balance support: No upper extremity supported Sitting balance-Leahy Scale: Good     Standing balance support: No upper extremity supported;Single extremity supported Standing balance-Leahy Scale: Poor Standing balance comment: mildl static instability                             Pertinent Vitals/Pain Pain Assessment: No/denies pain    Home Living Family/patient expects to be discharged to:: Private residence Living Arrangements: Spouse/significant other Available Help at Discharge: Family;Available 24 hours/day Type of Home: House Home Access: Ramped entrance     Home Layout: One level Home Equipment: Walker - 2 wheels;Walker - 4 wheels;Cane - single point;Bedside commode;Shower seat;Grab bars - toilet;Grab bars - tub/shower (lift chair)      Prior Function Level of Independence: Independent with assistive device(s)               Hand Dominance        Extremity/Trunk Assessment   Upper Extremity Assessment: Defer to OT evaluation           Lower Extremity Assessment: RLE deficits/detail;LLE deficits/detail RLE Deficits / Details: generally weak hams and hip flexors, quads 4/5 LLE Deficits / Details: generally weak in hams and hip flexors, df 3-/5     Communication   Communication: No difficulties  Cognition Arousal/Alertness: Awake/alert  Behavior During Therapy: WFL for tasks assessed/performed Overall Cognitive Status: Within Functional Limits for tasks assessed                      General Comments      Exercises        Assessment/Plan    PT Assessment Patient needs continued PT services  PT Diagnosis Generalized weakness;Difficulty walking   PT Problem List Decreased strength;Decreased activity tolerance;Decreased balance;Decreased  mobility;Cardiopulmonary status limiting activity  PT Treatment Interventions DME instruction;Gait training;Functional mobility training;Therapeutic activities;Balance training;Patient/family education   PT Goals (Current goals can be found in the Care Plan section) Acute Rehab PT Goals Patient Stated Goal: Easier to get out/get to work PT Goal Formulation: With patient Time For Goal Achievement: 08/28/15 Potential to Achieve Goals: Good    Frequency Min 3X/week   Barriers to discharge        Co-evaluation               End of Session Equipment Utilized During Treatment: Oxygen Activity Tolerance: Patient tolerated treatment well Patient left: in chair;with call bell/phone within reach;with family/visitor present Nurse Communication: Mobility status         Time: 1031-1056 PT Time Calculation (min) (ACUTE ONLY): 25 min   Charges:   PT Evaluation $Initial PT Evaluation Tier I: 1 Procedure PT Treatments $Gait Training: 8-22 mins   PT G Codes:        Raynisha Avilla, Tessie Fass 08/21/2015, 11:12 AM 08/21/2015  Donnella Sham, PT [phone]  (pager)

## 2015-08-21 NOTE — Progress Notes (Signed)
PROGRESS NOTE  Subjective:   Travis [name] is a 69 yo male with PMHx of HTN, OSA on CPAP at home, chronic systolic CHF, [id number], and hypothyroidism who presented to the ED on 08/19/15 with complaint of shortness of breath, nausea and fever.  Troponin levels are mildly elevated.  0.18 this am  Had some worsening respiratory distress last night - gave him some lasix and now he feels better.    Objective:    Vital Signs:   Temp:  [97.5 F (36.4 C)-98.7 F (37.1 C)] 98.2 F (36.8 C) (08/23 0753) Pulse Rate:  [57-85] 64 (08/23 0753) Resp:  [14-32] 32 (08/23 0753) BP: (133-166)/(51-82) 153/54 mmHg (08/23 0753) SpO2:  [86 %-96 %] 95 % (08/23 0753)  Last BM Date: 08/18/15   24-hour weight change: Weight change:   Weight trends: Filed Weights   08/19/15 1703 08/19/15 2100  Weight: 129.275 kg (285 lb) 129.6 kg (285 lb 11.5 oz)    Intake/Output:  08/22 0701 - 08/23 0700 In: 3113 [P.O.:1680; I.V.:883; IV Piggyback:550] Out: 2500 [Urine:2500] Total I/O In: -  Out: 325 [Urine:325]   Physical Exam: BP 153/54 mmHg  Pulse 64  Temp(Src) 98.2 F (36.8 C) (Oral)  Resp 32  Ht 6\' 1"  (1.854 m)  Wt 129.6 kg (285 lb 11.5 oz)  BMI 37.70 kg/m2  SpO2 95%  Wt Readings from Last 3 Encounters:  08/19/15 129.6 kg (285 lb 11.5 oz)  08/17/15 128.096 kg (282 lb 6.4 oz)  03/19/15 124.739 kg (275 lb)    General: Vital signs reviewed and noted.   Head: Normocephalic, atraumatic.  Eyes: conjunctivae/corneas clear.  EOM's intact.   Throat: normal  Neck:  normal   Lungs:     Clear posteriorly   Heart:   RR   Abdomen:  Soft, non-tender, non-distended    Extremities: 1+ pitting edem    Neurologic: A&O X3, CN II - XII are grossly intact.   Psych: Normal     Labs: BMET:  Recent Labs  08/20/15 0256 08/21/15 0145  NA 145 141  K 4.0 3.9  CL 109 107  CO2 29 27  GLUCOSE 149* 162*  BUN 30* 23*  CREATININE 1.69* 1.38*  CALCIUM 8.5* 8.3*    Liver function tests:  Recent Labs  08/19/15 1730  AST 31  ALT 36  ALKPHOS 83  BILITOT 0.6  PROT 7.2  ALBUMIN 4.2   No results for input(s): LIPASE, AMYLASE in the last 72 hours.  CBC:  Recent Labs  08/19/15 1730 08/20/15 0256 08/21/15 0145  WBC 7.0 12.2* 9.1  NEUTROABS 6.1  --   --   HGB 13.9 12.0* 11.6*  HCT 41.5 36.2* 35.3*  MCV 94.1 96.5 95.9  PLT 159 132* 146*    Cardiac Enzymes:  Recent Labs  08/20/15 0833 08/20/15 1458 08/20/15 2124 08/21/15 0145  TROPONINI 0.29* 0.20* 0.17* 0.19*    Coagulation Studies:  Recent Labs  08/19/15 2133  LABPROT 15.2  INR 1.18    Other: Invalid input(s): POCBNP No results for input(s): DDIMER in the last 72 hours.  Recent Labs  08/20/15 0256  HGBA1C 6.7*    Recent Labs  08/20/15 1458  CHOL 124  HDL 29*  LDLCALC 79  TRIG 82  CHOLHDL 4.3    Recent Labs  08/20/15 1030  TSH 0.299*   No results for input(s): VITAMINB12, FOLATE, FERRITIN, TIBC, IRON, RETICCTPCT in the last 72 hours.   Other results:  Tele   ( personally  reviewed )  - NSR   Medications:    Infusions:    Scheduled Medications: . antiseptic oral rinse  7 mL Mouth Rinse BID  . aspirin EC  81 mg Oral Daily  . baclofen  5 mg Oral TID  . bisacodyl  10 mg Rectal Daily  . enoxaparin (LOVENOX) injection  60 mg Subcutaneous Q24H  . escitalopram  10 mg Oral Daily  . fesoterodine  8 mg Oral Daily  . gabapentin  100 mg Oral TID  . glimepiride  1 mg Oral Q breakfast  . insulin aspart  0-15 Units Subcutaneous TID WC  . insulin aspart  0-5 Units Subcutaneous QHS  . levothyroxine  175 mcg Oral Daily  . piperacillin-tazobactam (ZOSYN)  IV  3.375 g Intravenous 3 times per day  . pramipexole  0.5 mg Oral BID  . tamsulosin  0.4 mg Oral Daily  . vancomycin  1,000 mg Intravenous Q12H    Assessment/ Plan:   Active Problems:   HTN (hypertension)   Chronic systolic CHF (congestive heart failure)   Sepsis   OSA on CPAP   Acute respiratory failure with hypoxia   Type II  diabetes mellitus   HCAP (healthcare-associated pneumonia)   Hypothyroid   AKI (acute kidney injury)  1. Acute on chronic diastolic dysfunction:  Exacerbated by pneumonia and temp of 104. Has had + troponin levels which are slowly improving I think he needs a R and L heart cath tomorrow. Had some respiratory distress last night - responded to IV lasix   Will give him another dose of IV lasix today   2. Pneumonia-  Better after antibiotics.   3. Essential HTN:  Will add hydralazine 25 TID today .  I think we can resume ARB after cath - I'm giving hydralazine in hopes that his creatinine stays stable prior to his cath    Disposition:  Length of Stay: 2  Ramond Dial., MD, [facility] 08/21/2015, 8:37 AM Office [phone] Pager [phone]

## 2015-08-22 ENCOUNTER — Inpatient Hospital Stay (HOSPITAL_COMMUNITY): Payer: Medicare Other

## 2015-08-22 LAB — BASIC METABOLIC PANEL
ANION GAP: 6 (ref 5–15)
BUN: 16 mg/dL (ref 6–20)
CALCIUM: 8.5 mg/dL — AB (ref 8.9–10.3)
CO2: 30 mmol/L (ref 22–32)
Chloride: 103 mmol/L (ref 101–111)
Creatinine, Ser: 1.15 mg/dL (ref 0.61–1.24)
Glucose, Bld: 109 mg/dL — ABNORMAL HIGH (ref 65–99)
POTASSIUM: 3.6 mmol/L (ref 3.5–5.1)
Sodium: 139 mmol/L (ref 135–145)

## 2015-08-22 LAB — CBC
HEMATOCRIT: 35.4 % — AB (ref 39.0–52.0)
HEMATOCRIT: 38.4 % — AB (ref 39.0–52.0)
HEMOGLOBIN: 11.8 g/dL — AB (ref 13.0–17.0)
Hemoglobin: 13 g/dL (ref 13.0–17.0)
MCH: 31.5 pg (ref 26.0–34.0)
MCH: 31.7 pg (ref 26.0–34.0)
MCHC: 33.3 g/dL (ref 30.0–36.0)
MCHC: 33.9 g/dL (ref 30.0–36.0)
MCV: 94.4 fL (ref 78.0–100.0)
MCV: 93.7 fL (ref 78.0–100.0)
Platelets: 142 10*3/uL — ABNORMAL LOW (ref 150–400)
Platelets: 157 10*3/uL (ref 150–400)
RBC: 3.75 MIL/uL — AB (ref 4.22–5.81)
RBC: 4.1 MIL/uL — ABNORMAL LOW (ref 4.22–5.81)
RDW: 13.7 % (ref 11.5–15.5)
RDW: 13.6 % (ref 11.5–15.5)
WBC: 6.1 10*3/uL (ref 4.0–10.5)
WBC: 6.7 10*3/uL (ref 4.0–10.5)

## 2015-08-22 LAB — URINALYSIS, ROUTINE W REFLEX MICROSCOPIC
Bilirubin Urine: NEGATIVE
GLUCOSE, UA: NEGATIVE mg/dL
HGB URINE DIPSTICK: NEGATIVE
Ketones, ur: NEGATIVE mg/dL
Nitrite: NEGATIVE
PH: 6.5 (ref 5.0–8.0)
Protein, ur: NEGATIVE mg/dL
SPECIFIC GRAVITY, URINE: 1.014 (ref 1.005–1.030)
Urobilinogen, UA: 0.2 mg/dL (ref 0.0–1.0)

## 2015-08-22 LAB — GLUCOSE, CAPILLARY
GLUCOSE-CAPILLARY: 149 mg/dL — AB (ref 65–99)
GLUCOSE-CAPILLARY: 130 mg/dL — AB (ref 65–99)
Glucose-Capillary: 143 mg/dL — ABNORMAL HIGH (ref 65–99)
Glucose-Capillary: 118 mg/dL — ABNORMAL HIGH (ref 65–99)
Glucose-Capillary: 136 mg/dL — ABNORMAL HIGH (ref 65–99)

## 2015-08-22 LAB — URINE MICROSCOPIC-ADD ON

## 2015-08-22 MED ORDER — HYDRALAZINE HCL 20 MG/ML IJ SOLN
10.0000 mg | Freq: Four times a day (QID) | INTRAMUSCULAR | Status: DC | PRN
  Administered 2015-08-22: 10 mg via INTRAVENOUS
  Filled 2015-08-22: qty 1

## 2015-08-22 MED ORDER — POTASSIUM CHLORIDE ER 10 MEQ PO TBCR
20.0000 meq | EXTENDED_RELEASE_TABLET | Freq: Every day | ORAL | Status: DC
  Administered 2015-08-22 – 2015-08-23 (×2): 20 meq via ORAL
  Filled 2015-08-22 (×4): qty 2

## 2015-08-22 MED ORDER — FUROSEMIDE 40 MG PO TABS
40.0000 mg | ORAL_TABLET | Freq: Every day | ORAL | Status: DC
  Administered 2015-08-22 – 2015-08-23 (×2): 40 mg via ORAL
  Filled 2015-08-22 (×3): qty 1

## 2015-08-22 MED ORDER — AMLODIPINE BESYLATE 10 MG PO TABS
10.0000 mg | ORAL_TABLET | Freq: Every day | ORAL | Status: DC
  Administered 2015-08-22 – 2015-08-23 (×2): 10 mg via ORAL
  Filled 2015-08-22 (×2): qty 1

## 2015-08-22 NOTE — Progress Notes (Signed)
ANTIBIOTIC CONSULT NOTE - Follow-up  Pharmacy Consult for Vanco/Zosyn Indication: rule out sepsis  Allergies  Allergen Reactions  . Other Other (See Comments)    Anesthesia makes sick, must have antinausea medication in advance.    Patient Measurements: Height: 6\' 1"  (185.4 cm) Weight: 282 lb 13.6 oz (128.3 kg) IBW/kg (Calculated) : 79.9    Vital Signs: Temp: 97.6 F (36.4 C) (08/24 0725) Temp Source: Oral (08/24 0725) BP: 150/57 mmHg (08/24 0725) Pulse Rate: 63 (08/24 0725) Intake/Output from previous day: 08/23 0701 - 08/24 0700 In: 850 [P.O.:600; IV Piggyback:250] Out: 2475 [Urine:2475] Intake/Output from this shift: Total I/O In: 240 [P.O.:240] Out: 450 [Urine:450]  Labs:  Recent Labs  08/20/15 0256 08/21/15 0145 08/22/15 0241  WBC 12.2* 9.1 6.1  HGB 12.0* 11.6* 11.8*  PLT 132* 146* 142*  CREATININE 1.69* 1.38* 1.15   Estimated Creatinine Clearance: 83.9 mL/min (by C-G formula based on Cr of 1.15). No results for input(s): VANCOTROUGH, VANCOPEAK, VANCORANDOM, GENTTROUGH, GENTPEAK, GENTRANDOM, TOBRATROUGH, TOBRAPEAK, TOBRARND, AMIKACINPEAK, AMIKACINTROU, AMIKACIN in the last 72 hours.   Microbiology: Recent Results (from the past 720 hour(s))  Blood Culture (routine x 2)     Status: None (Preliminary result)   Collection Time: 08/19/15  5:30 PM  Result Value Ref Range Status   Specimen Description BLOOD RIGHT HAND  Final   Special Requests BOTTLES DRAWN AEROBIC AND ANAEROBIC 5CC  Final   Culture NO GROWTH 2 DAYS  Final   Report Status PENDING  Incomplete  Blood Culture (routine x 2)     Status: None (Preliminary result)   Collection Time: 08/19/15  5:40 PM  Result Value Ref Range Status   Specimen Description BLOOD LEFT HAND  Final   Special Requests BOTTLES DRAWN AEROBIC AND ANAEROBIC 5CC  Final   Culture NO GROWTH 2 DAYS  Final   Report Status PENDING  Incomplete  Urine culture     Status: None   Collection Time: 08/19/15  6:16 PM  Result Value Ref  Range Status   Specimen Description URINE, CLEAN CATCH  Final   Special Requests NONE  Final   Culture MULTIPLE SPECIES PRESENT, SUGGEST RECOLLECTION  Final   Report Status 08/21/2015 FINAL  Final  MRSA PCR Screening     Status: None   Collection Time: 08/20/15 12:12 AM  Result Value Ref Range Status   MRSA by PCR NEGATIVE NEGATIVE Final    Comment:        The GeneXpert MRSA Assay (FDA approved for NASAL specimens only), is one component of a comprehensive MRSA colonization surveillance program. It is not intended to diagnose MRSA infection nor to guide or monitor treatment for MRSA infections.    Assessment:   51 yom continues on broad-spectrum antibiotics for treatment of sepsis. Pt is afebrile and WBC is WNL. SCr improved to 1.15 but doses remain appropriate. Cultures have been negative to date.   Vanc 8/21>> Zosyn 8/21>>  8/21 Blood - NGTD 8/21 Urine - NEG 8/22 MRSA - NEG  Goal of Therapy:  Vancomycin trough level 15-20 mcg/ml  Plan:  - Continue vancomycin 1gm IV Q12H - Continue zosyn 3.375gm IV Q8H (4 hr inf) - F/u renal fxn, C&S, clinical status and trough at [facility] - MD - Consider DC vancomycin and de-escalating antibiotics.  Salome Arnt, PharmD, BCPS Pager # [phone] 08/22/2015 11:00 AM

## 2015-08-22 NOTE — Progress Notes (Signed)
Pt self administered Cpap.  Visited patients room pt tolerating well no issues no resp distress. Will continue to monitor.

## 2015-08-22 NOTE — Progress Notes (Signed)
Pt c/o of chills. Oral temp taken WNL. Family concerned and would like for MD to come assess patient. Dr. Candiss Norse contacted. New orders given. Will carry those out. Family continues to want Dr. Candiss Norse at bedside, MD made aware. Dr. Candiss Norse came to bedside to see patient and speak to the family.

## 2015-08-22 NOTE — Interval H&P Note (Signed)
Cath Lab Visit (complete for each Cath Lab visit)  Clinical Evaluation Leading to the Procedure:   ACS: Yes.    Non-ACS:    Anginal Classification: CCS II  Anti-ischemic medical therapy: Minimal Therapy (1 class of medications)  Non-Invasive Test Results: No non-invasive testing performed  Prior CABG: No previous CABG      History and Physical Interval Note:  08/22/2015 8:13 PM  Travis [name]  has presented today for surgery, with the diagnosis of pos trup  The various methods of treatment have been discussed with the patient and family. After consideration of risks, benefits and other options for treatment, the patient has consented to  Procedure(s): Right/Left Heart Cath and Coronary Angiography ([name]) as a surgical intervention .  The patient's history has been reviewed, patient examined, no change in status, stable for surgery.  I have reviewed the patient's chart and labs.  Questions were answered to the patient's satisfaction.     Sinclair Grooms

## 2015-08-22 NOTE — Progress Notes (Addendum)
Patient Demographics:    Travis [name], is a 69 y.o. male, DOB - 1946/05/08, [id number]  Admit date - 08/19/2015   Admitting Physician Theressa Millard, MD  Outpatient Primary MD for the patient is Simona Huh, MD  LOS - 3   Chief Complaint  Patient presents with  . Fever  . Emesis        Subjective:    Travis [name] today has, No headache, No chest pain, No abdominal pain - No Nausea, No new weakness tingling or numbness, No Cough - SOB. Overall feels much better.   Assessment  & Plan :    Addendum - 6pm chills reported, looks non toxic, repeat CXR stable, B Cultures ordered, already on HCAP ABX, follow temp curve, check CT stone protocol R/O renal stone.   1. Sepsis due to HCAP. Improved with present empiric anti-biotics which will be continued, monitor cultures negative to date since admission, supportive care with oxygen and nebulizer treatments as needed. Gentle hydration. Overall appears much improved. We'll increase activity and titrate off oxygen if he can.   2. Acute on chronic diastolic heart failure EF 60%. Cardiology on board, euvolemic and stabilized after gentle Lasix by cardiology.   3. Mild elevation in troponin. Non-ACS pattern rise in troponin, EKG nonacute, no chest pain, likely mild troponin rise most likely from demand ischemia caused by sepsis. On aspirin. Blood pressure too low for beta blocker. Echo stable EF of 60% without any wall motion abnormality. Cardiology following. Left and right heart catheterization by cardiology on 08/23/2015.   4. Chronic back pain. Supportive care.   5. Mild bradycardia. Mildly low TSH most likely sick euthyroid, cardiology following, he is asymptomatic, avoid beta blocker or calcium channel blocker at this time.   6. ARF.  Due to sepsis. Hold home dose ACE inhibitor, resolved after gentle initial hydration on day 1 of admission .   7. Essential hypertension. Blood pressure on the softer side will hold any blood pressure medication.   8. Hypothyroidism. Home dose Synthroid resumed, mildly low TSH suggests sick euthyroid syndrome. Repeat TSH along with T3 and free T4 in 2-3 weeks.   9. OSA. CPAP daily at bedtime.    10. DM type II. Check A1c, Amaryl continue present dose, add pre-meal NovoLog along with sliding scale.   CBG (last 3)   Recent Labs  08/21/15 1639 08/21/15 2123 08/22/15 0724  GLUCAP 138* 163* 149*      Code Status : Full  Family Communication  : Wife  Disposition Plan  : Home 1-2 days  Consults  :  Cards  Procedures  :    Echocardiogram needed EF of 0000000, grade 2 diastolic dysfunction. No wall motion abnormality.  DVT Prophylaxis  :  Lovenox    Lab Results  Component Value Date   PLT 142* 08/22/2015    Inpatient Medications  Scheduled Meds: . antiseptic oral rinse  7 mL Mouth Rinse BID  . aspirin  81 mg Oral Pre-Cath  . aspirin EC  81 mg Oral Daily  . baclofen  5 mg Oral TID  . bisacodyl  10 mg Rectal Daily  . enoxaparin (LOVENOX) injection  60 mg Subcutaneous Q24H  . escitalopram  10 mg Oral Daily  .  fesoterodine  8 mg Oral Daily  . furosemide  40 mg Oral Daily  . gabapentin  100 mg Oral TID  . glimepiride  1 mg Oral Q breakfast  . hydrALAZINE  25 mg Oral 3 times per day  . insulin aspart  0-15 Units Subcutaneous TID WC  . insulin aspart  0-5 Units Subcutaneous QHS  . insulin aspart  3 Units Subcutaneous TID WC  . levothyroxine  175 mcg Oral Daily  . piperacillin-tazobactam (ZOSYN)  IV  3.375 g Intravenous 3 times per day  . potassium chloride  20 mEq Oral Daily  . pramipexole  0.5 mg Oral BID  . sodium chloride  3 mL Intravenous Q12H  . tamsulosin  0.4 mg Oral Daily  . vancomycin  1,000 mg Intravenous Q12H   Continuous Infusions:   PRN  Meds:.sodium chloride, acetaminophen **OR** [DISCONTINUED] acetaminophen, albuterol, alum & mag hydroxide-simeth, HYDROmorphone (DILAUDID) injection, [DISCONTINUED] ondansetron **OR** ondansetron (ZOFRAN) IV, oxyCODONE, polyethylene glycol, sodium chloride, zolpidem  Antibiotics  :    Anti-infectives    Start     Dose/Rate Route Frequency Ordered Stop   08/20/15 0600  vancomycin (VANCOCIN) IVPB 1000 mg/200 mL premix     1,000 mg 200 mL/hr over 60 Minutes Intravenous Every 12 hours 08/19/15 1750     08/19/15 2200  piperacillin-tazobactam (ZOSYN) IVPB 3.375 g     3.375 g 12.5 mL/hr over 240 Minutes Intravenous 3 times per day 08/19/15 1750     08/19/15 1800  vancomycin (VANCOCIN) IVPB 1000 mg/200 mL premix  Status:  Discontinued    Comments:  Give this dose to make total 2g load.   1,000 mg 200 mL/hr over 60 Minutes Intravenous NOW 08/19/15 1749 08/19/15 1749   08/19/15 1800  vancomycin (VANCOCIN) 2,000 mg in sodium chloride 0.9 % 500 mL IVPB     2,000 mg 250 mL/hr over 120 Minutes Intravenous NOW 08/19/15 1750 08/19/15 2010   08/19/15 1715  piperacillin-tazobactam (ZOSYN) IVPB 3.375 g     3.375 g 100 mL/hr over 30 Minutes Intravenous  Once 08/19/15 1711 08/19/15 1912   08/19/15 1715  vancomycin (VANCOCIN) IVPB 1000 mg/200 mL premix  Status:  Discontinued     1,000 mg 200 mL/hr over 60 Minutes Intravenous  Once 08/19/15 1711 08/19/15 1749        Objective:   Filed Vitals:   08/22/15 0301 08/22/15 0522 08/22/15 0650 08/22/15 0725  BP: 148/69  177/64 150/57  Pulse: 63  63 63  Temp: 98.3 F (36.8 C)   97.6 F (36.4 C)  TempSrc: Oral   Oral  Resp: 25  17 25   Height:      Weight:  128.3 kg (282 lb 13.6 oz)    SpO2: 95%  94% 94%    Wt Readings from Last 3 Encounters:  08/22/15 128.3 kg (282 lb 13.6 oz)  08/17/15 128.096 kg (282 lb 6.4 oz)  03/19/15 124.739 kg (275 lb)     Intake/Output Summary (Last 24 hours) at 08/22/15 0926 Last data filed at 08/22/15 0750  Gross per 24  hour  Intake    850 ml  Output   2600 ml  Net  -1750 ml     Physical Exam  Awake Alert, Oriented X 3, No new F.N deficits, Normal affect [place].AT,PERRAL Supple Neck,No JVD, No cervical lymphadenopathy appriciated.  Symmetrical Chest wall movement, Good air movement bilaterally, CTAB RRR,No Gallops,Rubs or new Murmurs, No Parasternal Heave +ve B.Sounds, Abd Soft, No tenderness, No organomegaly appriciated, No rebound -  guarding or rigidity. No Cyanosis, Clubbing or edema, No new Rash or bruise       Data Review:   Micro Results Recent Results (from the past 240 hour(s))  Blood Culture (routine x 2)     Status: None (Preliminary result)   Collection Time: 08/19/15  5:30 PM  Result Value Ref Range Status   Specimen Description BLOOD RIGHT HAND  Final   Special Requests BOTTLES DRAWN AEROBIC AND ANAEROBIC 5CC  Final   Culture NO GROWTH 2 DAYS  Final   Report Status PENDING  Incomplete  Blood Culture (routine x 2)     Status: None (Preliminary result)   Collection Time: 08/19/15  5:40 PM  Result Value Ref Range Status   Specimen Description BLOOD LEFT HAND  Final   Special Requests BOTTLES DRAWN AEROBIC AND ANAEROBIC 5CC  Final   Culture NO GROWTH 2 DAYS  Final   Report Status PENDING  Incomplete  Urine culture     Status: None   Collection Time: 08/19/15  6:16 PM  Result Value Ref Range Status   Specimen Description URINE, CLEAN CATCH  Final   Special Requests NONE  Final   Culture MULTIPLE SPECIES PRESENT, SUGGEST RECOLLECTION  Final   Report Status 08/21/2015 FINAL  Final  MRSA PCR Screening     Status: None   Collection Time: 08/20/15 12:12 AM  Result Value Ref Range Status   MRSA by PCR NEGATIVE NEGATIVE Final    Comment:        The GeneXpert MRSA Assay (FDA approved for NASAL specimens only), is one component of a comprehensive MRSA colonization surveillance program. It is not intended to diagnose MRSA infection nor to guide or monitor treatment for MRSA  infections.     Radiology Reports Dg Chest 2 View  08/20/2015   CLINICAL DATA:  Shortness of breath this morning.  EXAM: CHEST  2 VIEW  COMPARISON:  08/19/2015  FINDINGS: The lungs are hypoinflated with mild elevation the right hemidiaphragm unchanged. There is mild right basilar opacification likely atelectasis. Interval resolution of previously noted left perihilar/basilar opacification. Mild stable prominence of the cardiac silhouette. There is minimal calcified plaque over the aortic arch. Remainder of the exam is unchanged to include stabilization hardware over the lower thoracic spine as well as cervical spine.  IMPRESSION: Hypoinflation with mild right basilar opacification likely atelectasis. Resolved left perihilar/ basilar opacification.  Mild stable cardiomegaly.   Electronically Signed   By: Marin Olp M.D.   On: 08/20/2015 09:05   Dg Chest Port 1 View  08/21/2015   CLINICAL DATA:  Shortness of breath.  EXAM: PORTABLE CHEST - 1 VIEW  COMPARISON:  08/20/2015  FINDINGS: Mediastinum and hilar structures are normal. Bibasilar subsegmental atelectasis and/or infiltrates present. These are progressive from prior exam. No pleural effusion or pneumothorax. Heart size normal. Prior cervical thoracic spine fusion.  IMPRESSION: Progressive bibasilar atelectasis and/or infiltrates. Bibasilar pneumonia cannot be excluded.   Electronically Signed   By: Marcello Moores  Register   On: 08/21/2015 07:52   Dg Chest Port 1 View  08/19/2015   CLINICAL DATA:  Acute onset fever, chills and vomiting today.  EXAM: PORTABLE CHEST - 1 VIEW  COMPARISON:  Single view of the chest 07/15/2014 and 07/14/2012.  FINDINGS: There streaky airspace disease in the left lower lung zone. Lung volumes are low. Linear subsegmental atelectasis is seen in the right base. Mild asymmetric elevation of the right hemidiaphragm relative to the left is noted. Heart size is upper  normal. No pneumothorax or pleural effusion.  IMPRESSION: Left  basilar airspace disease is worrisome for pneumonia given symptoms although it could be atelectasis in this low volume chest.  Mild, subsegmental atelectasis right lung base is noted.   Electronically Signed   By: Inge Rise M.D.   On: 08/19/2015 17:54     CBC  Recent Labs Lab 08/19/15 1730 08/20/15 0256 08/21/15 0145 08/22/15 0241  WBC 7.0 12.2* 9.1 6.1  HGB 13.9 12.0* 11.6* 11.8*  HCT 41.5 36.2* 35.3* 35.4*  PLT 159 132* 146* 142*  MCV 94.1 96.5 95.9 94.4  MCH 31.5 32.0 31.5 31.5  MCHC 33.5 33.1 32.9 33.3  RDW 13.9 14.3 14.3 13.7  LYMPHSABS 0.5*  --   --   --   MONOABS 0.4  --   --   --   EOSABS 0.1  --   --   --   BASOSABS 0.0  --   --   --     Chemistries   Recent Labs Lab 08/17/15 1115 08/19/15 1730 08/20/15 0256 08/21/15 0145 08/22/15 0241  NA 144 142 145 141 139  K 4.4 4.3 4.0 3.9 3.6  CL 107 106 109 107 103  CO2 31 23 29 27 30   GLUCOSE 122* 175* 149* 162* 109*  BUN 32* 29* 30* 23* 16  CREATININE 1.28 1.39* 1.69* 1.38* 1.15  CALCIUM 9.5 9.1 8.5* 8.3* 8.5*  AST  --  31  --   --   --   ALT  --  36  --   --   --   ALKPHOS  --  83  --   --   --   BILITOT  --  0.6  --   --   --    ------------------------------------------------------------------------------------------------------------------ estimated creatinine clearance is 83.9 mL/min (by C-G formula based on Cr of 1.15). ------------------------------------------------------------------------------------------------------------------  Recent Labs  08/20/15 0256  HGBA1C 6.7*   ------------------------------------------------------------------------------------------------------------------  Recent Labs  08/20/15 1458  CHOL 124  HDL 29*  LDLCALC 79  TRIG 82  CHOLHDL 4.3   ------------------------------------------------------------------------------------------------------------------  Recent Labs  08/20/15 1030  TSH 0.299*    ------------------------------------------------------------------------------------------------------------------ No results for input(s): VITAMINB12, FOLATE, FERRITIN, TIBC, IRON, RETICCTPCT in the last 72 hours.  Coagulation profile  Recent Labs Lab 08/19/15 2133  INR 1.18    No results for input(s): DDIMER in the last 72 hours.  Cardiac Enzymes  Recent Labs Lab 08/20/15 1458 08/20/15 2124 08/21/15 0145  TROPONINI 0.20* 0.17* 0.19*   ------------------------------------------------------------------------------------------------------------------ Invalid input(s): POCBNP   Time Spent in minutes   35   SINGH,PRASHANT K M.D on 08/22/2015 at 9:26 AM  Between 7am to 7pm - Pager - [phone]  After 7pm go to [email or web address] - password [facility]  [facility] -  Office  [phone]

## 2015-08-22 NOTE — Care Management Important Message (Signed)
Important Message  Patient Details  Name: DHIR LOGAN MRN: [id number] Date of Birth: [birth year]   Medicare Important Message Given:  [facility] notification given    Nathen May 08/22/2015, 11:38 AMImportant Message  Patient Details  Name: MARITZA SLIKER MRN: [id number] Date of Birth: 1946/09/08   Medicare Important Message Given:  Yes-second notification given    Nathen May 08/22/2015, 11:38 AM

## 2015-08-22 NOTE — H&P (View-Only) (Signed)
PROGRESS NOTE  Subjective:   Travis [name] is a 69 yo male with PMHx of HTN, OSA on CPAP at home, chronic systolic CHF, 123456, and hypothyroidism who presented to the ED on 08/19/15 with complaint of shortness of breath, nausea and fever.  Is doing much better.    Objective:    Vital Signs:   Temp:  [97.6 F (36.4 C)-98.4 F (36.9 C)] 97.6 F (36.4 C) (08/24 0725) Pulse Rate:  [60-64] 63 (08/24 0725) Resp:  [15-25] 25 (08/24 0725) BP: (148-187)/(57-74) 150/57 mmHg (08/24 0725) SpO2:  [93 %-95 %] 94 % (08/24 0725) Weight:  [128.3 kg (282 lb 13.6 oz)] 128.3 kg (282 lb 13.6 oz) (08/24 0522)  Last BM Date: 08/21/15   24-hour weight change: Weight change:   Weight trends: Filed Weights   08/19/15 1703 08/19/15 2100 08/22/15 0522  Weight: 129.275 kg (285 lb) 129.6 kg (285 lb 11.5 oz) 128.3 kg (282 lb 13.6 oz)    Intake/Output:  08/23 0701 - 08/24 0700 In: 850 [P.O.:600; IV Piggyback:250] Out: [id number] [Urine:2475] Total I/O In: 0  Out: 450 [Urine:450]   Physical Exam: BP 150/57 mmHg  Pulse 63  Temp(Src) 97.6 F (36.4 C) (Oral)  Resp 25  Ht 6\' 1"  (1.854 m)  Wt 128.3 kg (282 lb 13.6 oz)  BMI 37.33 kg/m2  SpO2 94%  Wt Readings from Last 3 Encounters:  08/22/15 128.3 kg (282 lb 13.6 oz)  08/17/15 128.096 kg (282 lb 6.4 oz)  03/19/15 124.739 kg (275 lb)    General: Vital signs reviewed and noted.   Appears very comfortable   Head: Normocephalic, atraumatic.  Eyes: conjunctivae/corneas clear.  EOM's intact.   Throat: normal  Neck:  normal   Lungs:  Clear posteriorly   Heart:   RR   Abdomen:  Soft, non-tender, non-distended    Extremities: 1+ pitting edema    Neurologic: A&O X3, CN II - XII are grossly intact.   Psych: Normal     Labs: BMET:  Recent Labs  08/21/15 0145 08/22/15 0241  NA 141 139  K 3.9 3.6  CL 107 103  CO2 27 30  GLUCOSE 162* 109*  BUN 23* 16  CREATININE 1.38* 1.15  CALCIUM 8.3* 8.5*    Liver function tests:  Recent Labs  08/19/15 1730  AST 31  ALT 36  ALKPHOS 83  BILITOT 0.6  PROT 7.2  ALBUMIN 4.2   No results for input(s): LIPASE, AMYLASE in the last 72 hours.  CBC:  Recent Labs  08/19/15 1730  08/21/15 0145 08/22/15 0241  WBC 7.0  < > 9.1 6.1  NEUTROABS 6.1  --   --   --   HGB 13.9  < > 11.6* 11.8*  HCT 41.5  < > 35.3* 35.4*  MCV 94.1  < > 95.9 94.4  PLT 159  < > 146* 142*  < > = values in this interval not displayed.  Cardiac Enzymes:  Recent Labs  08/20/15 0833 08/20/15 1458 08/20/15 2124 08/21/15 0145  TROPONINI 0.29* 0.20* 0.17* 0.19*    Coagulation Studies:  Recent Labs  08/19/15 2133  LABPROT 15.2  INR 1.18    Other: Invalid input(s): POCBNP No results for input(s): DDIMER in the last 72 hours.  Recent Labs  08/20/15 0256  HGBA1C 6.7*    Recent Labs  08/20/15 1458  CHOL 124  HDL 29*  LDLCALC 79  TRIG 82  CHOLHDL 4.3    Recent Labs  08/20/15 1030  TSH 0.299*   No results for input(s): VITAMINB12, FOLATE, FERRITIN, TIBC, IRON, RETICCTPCT in the last 72 hours.   Other results:  Tele   ( personally reviewed )  - NSR   Medications:    Infusions:    Scheduled Medications: . antiseptic oral rinse  7 mL Mouth Rinse BID  . aspirin  81 mg Oral Pre-Cath  . aspirin EC  81 mg Oral Daily  . baclofen  5 mg Oral TID  . bisacodyl  10 mg Rectal Daily  . enoxaparin (LOVENOX) injection  60 mg Subcutaneous Q24H  . escitalopram  10 mg Oral Daily  . fesoterodine  8 mg Oral Daily  . gabapentin  100 mg Oral TID  . glimepiride  1 mg Oral Q breakfast  . hydrALAZINE  25 mg Oral 3 times per day  . insulin aspart  0-15 Units Subcutaneous TID WC  . insulin aspart  0-5 Units Subcutaneous QHS  . insulin aspart  3 Units Subcutaneous TID WC  . levothyroxine  175 mcg Oral Daily  . piperacillin-tazobactam (ZOSYN)  IV  3.375 g Intravenous 3 times per day  . pramipexole  0.5 mg Oral BID  . sodium chloride  3 mL Intravenous Q12H  . tamsulosin  0.4 mg Oral Daily    . vancomycin  1,000 mg Intravenous Q12H    Assessment/ Plan:   Active Problems:   HTN (hypertension)   Chronic systolic CHF (congestive heart failure)   Sepsis   OSA on CPAP   Acute respiratory failure with hypoxia   Type II diabetes mellitus   HCAP (healthcare-associated pneumonia)   Hypothyroid   AKI (acute kidney injury)  1. Acute on chronic diastolic dysfunction:  Exacerbated by pneumonia and temp of 104. Has had + troponin levels which are slowly improving I think he needs a R and L heart cath tomorrow. Better on lasix He is net -1.4 liters since admission  Will give PO lasix and kdur today    2. Pneumonia-  Better after antibiotics.   3. Essential HTN:  On  hydralazine 25 TID   I think we can resume ARB after cath - I'm giving hydralazine in hopes that his creatinine stays stable prior to his cath    Disposition:  Length of Stay: 3  [name], [name], MD, [facility] 08/22/2015, 8:21 AM Office [phone] Pager [phone]

## 2015-08-22 NOTE — Progress Notes (Signed)
PROGRESS NOTE  Subjective:   Travis [name] is a 69 yo male with PMHx of HTN, OSA on CPAP at home, chronic systolic CHF, 123456, and hypothyroidism who presented to the ED on 08/19/15 with complaint of shortness of breath, nausea and fever.  Is doing much better.    Objective:    Vital Signs:   Temp:  [97.6 F (36.4 C)-98.4 F (36.9 C)] 97.6 F (36.4 C) (08/24 0725) Pulse Rate:  [60-64] 63 (08/24 0725) Resp:  [15-25] 25 (08/24 0725) BP: (148-187)/(57-74) 150/57 mmHg (08/24 0725) SpO2:  [93 %-95 %] 94 % (08/24 0725) Weight:  [128.3 kg (282 lb 13.6 oz)] 128.3 kg (282 lb 13.6 oz) (08/24 0522)  Last BM Date: 08/21/15   24-hour weight change: Weight change:   Weight trends: Filed Weights   08/19/15 1703 08/19/15 2100 08/22/15 0522  Weight: 129.275 kg (285 lb) 129.6 kg (285 lb 11.5 oz) 128.3 kg (282 lb 13.6 oz)    Intake/Output:  08/23 0701 - 08/24 0700 In: 850 [P.O.:600; IV Piggyback:250] Out: [id number] [Urine:2475] Total I/O In: 0  Out: 450 [Urine:450]   Physical Exam: BP 150/57 mmHg  Pulse 63  Temp(Src) 97.6 F (36.4 C) (Oral)  Resp 25  Ht 6\' 1"  (1.854 m)  Wt 128.3 kg (282 lb 13.6 oz)  BMI 37.33 kg/m2  SpO2 94%  Wt Readings from Last 3 Encounters:  08/22/15 128.3 kg (282 lb 13.6 oz)  08/17/15 128.096 kg (282 lb 6.4 oz)  03/19/15 124.739 kg (275 lb)    General: Vital signs reviewed and noted.   Appears very comfortable   Head: Normocephalic, atraumatic.  Eyes: conjunctivae/corneas clear.  EOM's intact.   Throat: normal  Neck:  normal   Lungs:  Clear posteriorly   Heart:   RR   Abdomen:  Soft, non-tender, non-distended    Extremities: 1+ pitting edema    Neurologic: A&O X3, CN II - XII are grossly intact.   Psych: Normal     Labs: BMET:  Recent Labs  08/21/15 0145 08/22/15 0241  NA 141 139  K 3.9 3.6  CL 107 103  CO2 27 30  GLUCOSE 162* 109*  BUN 23* 16  CREATININE 1.38* 1.15  CALCIUM 8.3* 8.5*    Liver function tests:  Recent Labs  08/19/15 1730  AST 31  ALT 36  ALKPHOS 83  BILITOT 0.6  PROT 7.2  ALBUMIN 4.2   No results for input(s): LIPASE, AMYLASE in the last 72 hours.  CBC:  Recent Labs  08/19/15 1730  08/21/15 0145 08/22/15 0241  WBC 7.0  < > 9.1 6.1  NEUTROABS 6.1  --   --   --   HGB 13.9  < > 11.6* 11.8*  HCT 41.5  < > 35.3* 35.4*  MCV 94.1  < > 95.9 94.4  PLT 159  < > 146* 142*  < > = values in this interval not displayed.  Cardiac Enzymes:  Recent Labs  08/20/15 0833 08/20/15 1458 08/20/15 2124 08/21/15 0145  TROPONINI 0.29* 0.20* 0.17* 0.19*    Coagulation Studies:  Recent Labs  08/19/15 2133  LABPROT 15.2  INR 1.18    Other: Invalid input(s): POCBNP No results for input(s): DDIMER in the last 72 hours.  Recent Labs  08/20/15 0256  HGBA1C 6.7*    Recent Labs  08/20/15 1458  CHOL 124  HDL 29*  LDLCALC 79  TRIG 82  CHOLHDL 4.3    Recent Labs  08/20/15 1030  TSH 0.299*   No results for input(s): VITAMINB12, FOLATE, FERRITIN, TIBC, IRON, RETICCTPCT in the last 72 hours.   Other results:  Tele   ( personally reviewed )  - NSR   Medications:    Infusions:    Scheduled Medications: . antiseptic oral rinse  7 mL Mouth Rinse BID  . aspirin  81 mg Oral Pre-Cath  . aspirin EC  81 mg Oral Daily  . baclofen  5 mg Oral TID  . bisacodyl  10 mg Rectal Daily  . enoxaparin (LOVENOX) injection  60 mg Subcutaneous Q24H  . escitalopram  10 mg Oral Daily  . fesoterodine  8 mg Oral Daily  . gabapentin  100 mg Oral TID  . glimepiride  1 mg Oral Q breakfast  . hydrALAZINE  25 mg Oral 3 times per day  . insulin aspart  0-15 Units Subcutaneous TID WC  . insulin aspart  0-5 Units Subcutaneous QHS  . insulin aspart  3 Units Subcutaneous TID WC  . levothyroxine  175 mcg Oral Daily  . piperacillin-tazobactam (ZOSYN)  IV  3.375 g Intravenous 3 times per day  . pramipexole  0.5 mg Oral BID  . sodium chloride  3 mL Intravenous Q12H  . tamsulosin  0.4 mg Oral Daily    . vancomycin  1,000 mg Intravenous Q12H    Assessment/ Plan:   Active Problems:   HTN (hypertension)   Chronic systolic CHF (congestive heart failure)   Sepsis   OSA on CPAP   Acute respiratory failure with hypoxia   Type II diabetes mellitus   HCAP (healthcare-associated pneumonia)   Hypothyroid   AKI (acute kidney injury)  1. Acute on chronic diastolic dysfunction:  Exacerbated by pneumonia and temp of 104. Has had + troponin levels which are slowly improving I think he needs a R and L heart cath tomorrow. Better on lasix He is net -1.4 liters since admission  Will give PO lasix and kdur today    2. Pneumonia-  Better after antibiotics.   3. Essential HTN:  On  hydralazine 25 TID   I think we can resume ARB after cath - I'm giving hydralazine in hopes that his creatinine stays stable prior to his cath    Disposition:  Length of Stay: 3  [name], [name], MD, [facility] 08/22/2015, 8:21 AM Office [phone] Pager [phone]

## 2015-08-22 NOTE — Progress Notes (Signed)
Patient has increased WOB, when asked about feeling SOB, patient states "I'm a little short of breath." Prn breathing treatment given. Patient endorses feeling better after the treatment. Will cont to monitor.

## 2015-08-22 NOTE — Progress Notes (Signed)
Patient has been offered to ambulate twice today, patient has declined both offers stating "I'll walk later." Will cont to offer.

## 2015-08-22 NOTE — Progress Notes (Signed)
Patient has CPAP by bedside ready for use.  Patient stated he will place on when ready for bed.  Patient aware to ask RN to call respiratory if he needs additional help.

## 2015-08-23 ENCOUNTER — Encounter (HOSPITAL_COMMUNITY)
Admission: EM | Disposition: A | Discharge status: Home Health | Payer: Self-pay | Source: Home / Self Care | Attending: Internal Medicine

## 2015-08-23 ENCOUNTER — Inpatient Hospital Stay (HOSPITAL_COMMUNITY): Payer: Medicare Other

## 2015-08-23 DIAGNOSIS — I5043 Acute on chronic combined systolic (congestive) and diastolic (congestive) heart failure: Secondary | ICD-10-CM

## 2015-08-23 DIAGNOSIS — A419 Sepsis, unspecified organism: Principal | ICD-10-CM

## 2015-08-23 LAB — GLUCOSE, CAPILLARY
GLUCOSE-CAPILLARY: 149 mg/dL — AB (ref 65–99)
Glucose-Capillary: 134 mg/dL — ABNORMAL HIGH (ref 65–99)

## 2015-08-23 LAB — BASIC METABOLIC PANEL
Anion gap: 10 (ref 5–15)
BUN: 15 mg/dL (ref 6–20)
CALCIUM: 9.3 mg/dL (ref 8.9–10.3)
CHLORIDE: 102 mmol/L (ref 101–111)
CO2: 28 mmol/L (ref 22–32)
CREATININE: 1.1 mg/dL (ref 0.61–1.24)
GFR calc Af Amer: >60 mL/min (ref >60)
GFR calc non Af Amer: >60 mL/min (ref >60)
GLUCOSE: 145 mg/dL — AB (ref 65–99)
Potassium: 4.2 mmol/L (ref 3.5–5.1)
Sodium: 140 mmol/L (ref 135–145)

## 2015-08-23 LAB — CBC
HEMATOCRIT: 37.9 % — AB (ref 39.0–52.0)
HEMOGLOBIN: 12.9 g/dL — AB (ref 13.0–17.0)
MCH: 31.8 pg (ref 26.0–34.0)
MCHC: 34 g/dL (ref 30.0–36.0)
MCV: 93.3 fL (ref 78.0–100.0)
Platelets: 171 10*3/uL (ref 150–400)
RBC: 4.06 MIL/uL — ABNORMAL LOW (ref 4.22–5.81)
RDW: 13.6 % (ref 11.5–15.5)
WBC: 5.1 10*3/uL (ref 4.0–10.5)

## 2015-08-23 LAB — MAGNESIUM: Magnesium: 2.2 mg/dL (ref 1.7–2.4)

## 2015-08-23 SURGERY — RIGHT/LEFT HEART CATH AND CORONARY ANGIOGRAPHY

## 2015-08-23 MED ORDER — SODIUM CHLORIDE 0.9 % WEIGHT BASED INFUSION: 3.0000 mL/kg/h | INTRAVENOUS | Status: DC

## 2015-08-23 MED ORDER — SODIUM CHLORIDE 0.9 % IV SOLN: 250.0000 mL | INTRAVENOUS | Status: DC | PRN

## 2015-08-23 MED ORDER — SODIUM CHLORIDE 0.9 % IJ SOLN: 3.0000 mL | INTRAMUSCULAR | Status: DC | PRN

## 2015-08-23 MED ORDER — SODIUM CHLORIDE 0.9 % IV SOLN: INTRAVENOUS | Status: DC

## 2015-08-23 MED ORDER — LEVOFLOXACIN 500 MG PO TABS
500.0000 mg | ORAL_TABLET | Freq: Every day | ORAL | Status: DC
  Administered 2015-08-23: 500 mg via ORAL
  Filled 2015-08-23: qty 1

## 2015-08-23 MED ORDER — HYDRALAZINE HCL 25 MG PO TABS: 25.0000 mg | ORAL_TABLET | Freq: Three times a day (TID) | ORAL | Status: DC

## 2015-08-23 MED ORDER — AMLODIPINE BESYLATE 10 MG PO TABS: 10.0000 mg | ORAL_TABLET | Freq: Every day | ORAL | Status: DC

## 2015-08-23 MED ORDER — LEVOFLOXACIN 500 MG PO TABS: 500.0000 mg | ORAL_TABLET | Freq: Every day | ORAL | Status: DC

## 2015-08-23 MED ORDER — ASPIRIN 81 MG PO CHEW
81.0000 mg | CHEWABLE_TABLET | ORAL | Status: AC
  Administered 2015-08-23: 81 mg via ORAL
  Filled 2015-08-23: qty 1

## 2015-08-23 MED ORDER — SODIUM CHLORIDE 0.9 % IJ SOLN: 3.0000 mL | Freq: Two times a day (BID) | INTRAMUSCULAR | Status: DC

## 2015-08-23 MED ORDER — SODIUM CHLORIDE 0.9 % WEIGHT BASED INFUSION: 1.0000 mL/kg/h | INTRAVENOUS | Status: DC

## 2015-08-23 MED ORDER — ASPIRIN 81 MG PO TBEC: 81.0000 mg | DELAYED_RELEASE_TABLET | Freq: Every day | ORAL | Status: DC

## 2015-08-23 MED ORDER — ONDANSETRON HCL 4 MG PO TABS
4.0000 mg | ORAL_TABLET | Freq: Once | ORAL | Status: AC
  Administered 2015-08-23: 4 mg via ORAL
  Filled 2015-08-23: qty 1

## 2015-08-23 NOTE — Discharge Summary (Signed)
Travis [name], is a 69 y.o. male  DOB [birth year]  MRN [id number].  Admission date:  08/19/2015  Admitting Physician  Theressa Millard, MD  Discharge Date:  08/23/2015   Primary MD  Simona Huh, MD  Recommendations for primary care physician for things to follow:   Monitor final blood and urine culture results.  Recheck CBC, BMP, weight and diuretic dose in 3-4 days. Check TSH, T3 and free T4 in one week.   Admission Diagnosis  Sepsis, due to unspecified organism [A41.9]   Discharge Diagnosis  Sepsis, due to unspecified organism [A41.9]     Active Problems:   Sepsis   HTN (hypertension)   OSA on CPAP   Acute respiratory failure with hypoxia   Chronic systolic CHF (congestive heart failure)   Type II diabetes mellitus   HCAP (healthcare-associated pneumonia)   Hypothyroid   AKI (acute kidney injury)      Past Medical History  Diagnosis Date  . PONV (postoperative nausea and vomiting)   . Hypertension   . DJD (degenerative joint disease)     WHOLE SPINE  . Hyperthyroidism     Had Iodine to resolve issue DX 10 YR AGO  . Hypothyroidism     Low d/t treatment of hyperthyroidism  . Sleep apnea     DX  2009/2010 study done at [facility]  . Type II diabetes mellitus     diet controlled  . Heart murmur     Evaluated by cardiologist 4-5 years ago but does not currently see a cardiologist  . Gait disorder   . Cervical myelopathy     with myelomalacia at C5-6 and C3-4  . RLS (restless legs syndrome)   . Peripheral neuropathy   . 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 Constipation - functional     controlled with miralax  . RBBB   . 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 CHF (congestive heart failure)   . Pneumonia   . Restless legs syndrome 03/14/2015  . Renal disorder     acute kidney  failure    Past Surgical History  Procedure Laterality Date  . Cholecystectomy    . Back surgery      6 surgeries  . Lumbar laminectomy/decompression microdiscectomy  07/14/2012    Procedure: LUMBAR LAMINECTOMY/DECOMPRESSION MICRODISCECTOMY 2 LEVELS;  Surgeon: Eustace Moore, MD;  Location: [facility];  Service: Neurosurgery;  Laterality: Left;  Lumbar two three laminectomy, left thoracic four five transpedicular diskectomy       HPI  from the history and physical done on the day of admission:   Travis [name] is a 69 y.o. male with a history of combined Systolic and diastolic CHF, HTN, DM2 and OSA who presents to the Ed with complaints of fevers and chills, SOB and nausea and vomiting that started today. He had a fever to 104.4. He was found to have hypoxia when he arrived to the ED and was placed on BiPAP. Chest X-Ray revealed a Left Basilar  Pneumonia and he was found to have an elevated lactic acid and the Sepsis Protocol was initiated. IV Vancomycin and Zosyn was started and he was referred for admission.      Hospital Course:     1. Sepsis due to HCAP. Improved with present empiric anti-biotics which will be continued, clinically much improved, chest x-ray shows resolution of infiltrate, appears nontoxic, was treated with IV vancomycin and Zosyn and will be transitioned to 6 more days of oral Levaquin. He will get home oxygen if he qualifies. Currently no shortness of breath, he did develop a low-grade fever with chills yesterday however he has a significant runny nose with mild headache suggestive of a superimposed viral infection while he was in the hospital.   Repeat blood cultures and urine cultures are pending. Request PCP to kindly monitor. Check CBC, BMP and a 2 view chest x-ray next visit and adjust diuretic dose as needed.   2. Acute on chronic diastolic heart failure EF 60%. Cardiology on board, euvolemic and stabilized after IV Lasix, now transitioned to oral  Lasix with potassium supplement. Given written instructions on fluid and salt restriction. Will follow with cardiology within a week. Plan discussed with cardiologist Dr Acie Fredrickson, BP Meds adjusted. Request PCP to monitor weight and BMP closely.   3. Mild elevation in troponin. Non-ACS pattern rise in troponin, EKG nonacute, no chest pain, likely mild troponin rise most likely from demand ischemia caused by sepsis. On aspirin. H.rate too low for beta blocker. Echo stable EF of 60% without any wall motion abnormality.Plan discussed with cardiologist Dr Acie Fredrickson, BP Meds adjusted. Outpatient heart catheterization in the next few weeks .   4. Chronic back pain. Supportive care.   5. Mild bradycardia. Mildly low TSH most likely sick euthyroid, cardiology following, he is asymptomatic, avoid beta blocker or calcium channel blocker at this time.   6. ARF. Due to sepsis. Hold home dose ACE inhibitor, resolved after gentle initial hydration on day 1 of admission . Since her EF is good outpatient ACE inhibitor can be restarted after heart is resolution if needed by cardiology. Discussed with cardiology.   7. Essential hypertension. Blood pressure stable blood pressure medications adjusted as below, on Norvasc and hydralazine for now.   8. Hypothyroidism. Home dose Synthroid resumed, mildly low TSH suggests sick euthyroid syndrome. Repeat TSH along with T3 and free T4 in 2 weeks.   9. OSA. CPAP daily at bedtime.    10. DM type II. A1c acceptable home regimen will be continued.   Lab Results  Component Value Date   HGBA1C 6.7* 08/20/2015      Discharge Condition: Stable  Follow UP  Follow-up Information    Follow up with Simona Huh, MD. Schedule an appointment as soon as possible for a visit in 3 days.   Specialty:  Family Medicine   Why:  BMP, CBC   Contact information:   [address] [place] [address] [phone]       Follow up with Nahser, Wonda Cheng, MD.  Schedule an appointment as soon as possible for a visit in 1 week.   Specialty:  Cardiology   Why:  CHF   Contact information:   [address] [place] [address] [phone]        Consults obtained - Cards  Diet and Activity recommendation: See Discharge Instructions below  Discharge Instructions           Discharge Instructions  Discharge instructions    Complete by:  As directed   Follow with Primary MD Simona Huh, MD in 7 days   Get CBC, CMP, 2 view Chest X ray checked  by Primary MD next visit.    Activity: As tolerated with Full fall precautions use walker/cane & assistance as needed   Disposition Home     Diet: Heart Healthy Low Carb.  Check your Weight same time everyday, if you gain over 2 pounds, or you develop in leg swelling, experience more shortness of breath or chest pain, call your Primary MD immediately. Follow Cardiac Low Salt Diet and 1.5 lit/day fluid restriction.   On your next visit with your primary care physician please Get Medicines reviewed and adjusted.   Please request your Prim.MD to go over all Hospital Tests and Procedure/Radiological results at the follow up, please get all Hospital records sent to your Prim MD by signing hospital release before you go home.   If you experience worsening of your admission symptoms, develop shortness of breath, life threatening emergency, suicidal or homicidal thoughts you must seek medical attention immediately by calling 911 or calling your MD immediately  if symptoms less severe.  You Must read complete instructions/literature along with all the possible adverse reactions/side effects for all the Medicines you take and that have been prescribed to you. Take any new Medicines after you have completely understood and accpet all the possible adverse reactions/side effects.   Do not drive, operating heavy machinery, perform activities at heights, swimming or participation in water  activities or provide baby sitting services if your were admitted for syncope or siezures until you have seen by Primary MD or a Neurologist and advised to do so again.  Do not drive when taking Pain medications.    Do not take more than prescribed Pain, Sleep and Anxiety Medications  Special Instructions: If you have smoked or chewed Tobacco  in the last 2 yrs please stop smoking, stop any regular Alcohol  and or any Recreational drug use.  Wear Seat belts while driving.   Please note  You were cared for by a hospitalist during your hospital stay. If you have any questions about your discharge medications or the care you received while you were in the hospital after you are discharged, you can call the unit and asked to speak with the hospitalist on call if the hospitalist that took care of you is not available. Once you are discharged, your primary care physician will handle any further medical issues. Please note that NO REFILLS for any discharge medications will be authorized once you are discharged, as it is imperative that you return to your primary care physician (or establish a relationship with a primary care physician if you do not have one) for your aftercare needs so that they can reassess your need for medications and monitor your lab values.     Increase activity slowly    Complete by:  As directed              Discharge Medications       Medication List    STOP taking these medications        losartan 100 MG tablet  Commonly known as:  COZAAR      TAKE these medications        amLODipine 10 MG tablet  Commonly known as:  NORVASC  Take 1 tablet (10 mg total) by mouth daily.     aspirin 81 MG EC tablet  Take 1 tablet (81 mg total) by mouth daily.     baclofen 10 MG tablet  Commonly known as:  LIORESAL  Take 1 tablet (10 mg total) by mouth 3 (three) times daily. For muscle spasms     diclofenac 75 MG EC tablet  Commonly known as:  VOLTAREN  Take 75 mg by  mouth daily.     escitalopram 10 MG tablet  Commonly known as:  LEXAPRO  Take 10 mg by mouth daily.     furosemide 40 MG tablet  Commonly known as:  LASIX  Take 40 mg by mouth 2 (two) times daily.     gabapentin 400 MG capsule  Commonly known as:  NEURONTIN  TAKE 1 CAPSULE (400 MG TOTAL) BY MOUTH EVERY 8 (EIGHT) HOURS.     glimepiride 2 MG tablet  Commonly known as:  AMARYL  Take 2 mg by mouth daily with breakfast.     hydrALAZINE 25 MG tablet  Commonly known as:  APRESOLINE  Take 1 tablet (25 mg total) by mouth every 8 (eight) hours.     KLOR-CON 10 10 MEQ tablet  Generic drug:  potassium chloride  TAKE 1 TABLET (10 MEQ TOTAL) BY MOUTH DAILY.     levofloxacin 500 MG tablet  Commonly known as:  LEVAQUIN  Take 1 tablet (500 mg total) by mouth daily.     levothyroxine 175 MCG tablet  Commonly known as:  SYNTHROID, LEVOTHROID  Take 175 mcg by mouth daily.     polyethylene glycol packet  Commonly known as:  MIRALAX / GLYCOLAX  Take 17 g by mouth daily as needed. For constipation     pramipexole 0.5 MG tablet  Commonly known as:  MIRAPEX  Take 0.5 mg by mouth 2 (two) times daily.     tamsulosin 0.4 MG Caps capsule  Commonly known as:  FLOMAX  Take 0.4 mg by mouth daily.     TOVIAZ 8 MG Tb24 tablet  Generic drug:  fesoterodine  Take 8 mg by mouth daily.        Major procedures and Radiology Reports - PLEASE review detailed and final reports for all details, in brief -   TTE  - Left ventricle: The cavity size was normal. Wall thickness wasnormal. Systolic function was normal. The estimated ejectionfraction was in the range of 55% to 60%. Wall motion was normal;there were no regional wall motion abnormalities. Features areconsistent with a pseudonormal left ventricular filling pattern,with concomitant abnormal relaxation and increased fillingpressure (grade 2 diastolic dysfunction). - Left atrium: The atrium was mildly dilated.  Impressions:  Normal LV  systolic function; grade 2 diastolic dysfunction; mild LAE; trace TR.    Dg Chest 2 View  08/20/2015   CLINICAL DATA:  Shortness of breath this morning.  EXAM: CHEST  2 VIEW  COMPARISON:  08/19/2015  FINDINGS: The lungs are hypoinflated with mild elevation the right hemidiaphragm unchanged. There is mild right basilar opacification likely atelectasis. Interval resolution of previously noted left perihilar/basilar opacification. Mild stable prominence of the cardiac silhouette. There is minimal calcified plaque over the aortic arch. Remainder of the exam is unchanged to include stabilization hardware over the lower thoracic spine as well as cervical spine.  IMPRESSION: Hypoinflation with mild right basilar opacification likely atelectasis. Resolved left perihilar/ basilar opacification.  Mild stable cardiomegaly.   Electronically Signed   By: Marin Olp M.D.   On: 08/20/2015 09:05   Dg Chest Port 1 View  08/23/2015   CLINICAL DATA:  New  onset of shortness of breath.  EXAM: PORTABLE CHEST - 1 VIEW  COMPARISON:  Yesterday at 1723 hour  FINDINGS: Unchanged elevation of right hemidiaphragm. Lung volumes remain low. There is increased bibasilar atelectasis. The heart size is normal. No large pleural effusion or pneumothorax. No pulmonary edema.  IMPRESSION: Increased bibasilar atelectasis.  Low lung volumes persist.   Electronically Signed   By: Jeb Levering M.D.   On: 08/23/2015 06:56   Dg Chest Port 1 View  08/22/2015   CLINICAL DATA:  Dyspnea x1 day.  EXAM: PORTABLE CHEST - 1 VIEW  COMPARISON:  08/21/2015  FINDINGS: Previous hardware fusion of the mid thoracic spine. Heart size is normal. Diminished lung volumes are noted bilaterally. There is atelectasis identified in the left base.  IMPRESSION: 1. Low lung volumes and left base atelectasis.   Electronically Signed   By: Kerby Moors M.D.   On: 08/22/2015 17:30   Dg Chest Port 1 View  08/21/2015   CLINICAL DATA:  Shortness of breath.  EXAM:  PORTABLE CHEST - 1 VIEW  COMPARISON:  08/20/2015  FINDINGS: Mediastinum and hilar structures are normal. Bibasilar subsegmental atelectasis and/or infiltrates present. These are progressive from prior exam. No pleural effusion or pneumothorax. Heart size normal. Prior cervical thoracic spine fusion.  IMPRESSION: Progressive bibasilar atelectasis and/or infiltrates. Bibasilar pneumonia cannot be excluded.   Electronically Signed   By: Marcello Moores  Register   On: 08/21/2015 07:52   Dg Chest Port 1 View  08/19/2015   CLINICAL DATA:  Acute onset fever, chills and vomiting today.  EXAM: PORTABLE CHEST - 1 VIEW  COMPARISON:  Single view of the chest 07/15/2014 and 07/14/2012.  FINDINGS: There streaky airspace disease in the left lower lung zone. Lung volumes are low. Linear subsegmental atelectasis is seen in the right base. Mild asymmetric elevation of the right hemidiaphragm relative to the left is noted. Heart size is upper normal. No pneumothorax or pleural effusion.  IMPRESSION: Left basilar airspace disease is worrisome for pneumonia given symptoms although it could be atelectasis in this low volume chest.  Mild, subsegmental atelectasis right lung base is noted.   Electronically Signed   By: Inge Rise M.D.   On: 08/19/2015 17:54   Ct Renal Stone Study  08/22/2015   CLINICAL DATA:  Lower urinary tract infection. Frequent urination. Chills.  EXAM: CT ABDOMEN AND PELVIS WITHOUT CONTRAST  TECHNIQUE: Multidetector CT imaging of the abdomen and pelvis was performed following the standard protocol without IV contrast.  COMPARISON:  Renal ultrasound 07/14/2014  FINDINGS: Elevated right hemidiaphragm with adjacent atelectasis. Calcified granuloma in the right lower lobe. Minimal dependent atelectasis in the left lower lobe.  Motion artifact through the mid abdomen partially limits assessment. The kidneys are symmetric in size without stones or hydronephrosis. There is mild symmetric bilateral perinephric stranding.  Both ureters are decompressed without stones along the course.  Evaluation of the remaining solid and hollow viscera is limited given lack of contrast. Clips in the gallbladder fossa postcholecystectomy. No biliary dilatation. The spleen and adrenal glands are normal. Pancreatic atrophy without ductal dilatation or surrounding inflammatory change.  The stomach is physiologically distended. There are no dilated or thickened bowel loops. Moderate volume of colonic stool without colonic wall thickening. Motion artifact through the cecum, the appendix is tentatively identified. No free air, free fluid, or intra-abdominal fluid collection.  No retroperitoneal adenopathy. Abdominal aorta is normal in caliber. Moderate atherosclerosis of the abdominal aorta and its branches. No aneurysm.  Within the pelvis  the urinary bladder is physiologically distended. Prostate gland appears enlarged with central calcifications causing mild mass effect on the bladder base. Fat within both inguinal canals. There is no pelvic or inguinal adenopathy. No pelvic free fluid.  Postsurgical change in the lower thoracic spine. Multilevel degenerative change throughout the lumbar spine. There are no acute or suspicious osseous abnormalities.  IMPRESSION: 1. No renal stones or obstructive uropathy. Mild perinephric stranding is nonspecific, can be seen in the setting of urinary tract infection or chronic kidney disease. 2. No acute abnormality in the abdomen/pelvis. 3. Chronic incidental findings include enlarged prostate gland causing mass effect on the bladder base. Atherosclerosis of the abdominal aorta and its branches without aneurysm.   Electronically Signed   By: Jeb Levering M.D.   On: 08/22/2015 21:12    Micro Results      Recent Results (from the past 240 hour(s))  Blood Culture (routine x 2)     Status: None (Preliminary result)   Collection Time: 08/19/15  5:30 PM  Result Value Ref Range Status   Specimen Description  BLOOD RIGHT HAND  Final   Special Requests BOTTLES DRAWN AEROBIC AND ANAEROBIC 5CC  Final   Culture NO GROWTH 3 DAYS  Final   Report Status PENDING  Incomplete  Blood Culture (routine x 2)     Status: None (Preliminary result)   Collection Time: 08/19/15  5:40 PM  Result Value Ref Range Status   Specimen Description BLOOD LEFT HAND  Final   Special Requests BOTTLES DRAWN AEROBIC AND ANAEROBIC 5CC  Final   Culture NO GROWTH 3 DAYS  Final   Report Status PENDING  Incomplete  Urine culture     Status: None   Collection Time: 08/19/15  6:16 PM  Result Value Ref Range Status   Specimen Description URINE, CLEAN CATCH  Final   Special Requests NONE  Final   Culture MULTIPLE SPECIES PRESENT, SUGGEST RECOLLECTION  Final   Report Status 08/21/2015 FINAL  Final  MRSA PCR Screening     Status: None   Collection Time: 08/20/15 12:12 AM  Result Value Ref Range Status   MRSA by PCR NEGATIVE NEGATIVE Final    Comment:        The GeneXpert MRSA Assay (FDA approved for NASAL specimens only), is one component of a comprehensive MRSA colonization surveillance program. It is not intended to diagnose MRSA infection nor to guide or monitor treatment for MRSA infections.        Today   Subjective    Jakevion Abney today has no headache,no chest abdominal pain,no new weakness tingling or numbness, feels much better wants to go home today.     Objective   Blood pressure 151/72, pulse 88, temperature 98.3 F (36.8 C), temperature source Oral, resp. rate 20, height 6\' 1"  (1.854 m), weight 126.871 kg (279 lb 11.2 oz), SpO2 92 %.   Intake/Output Summary (Last 24 hours) at 08/23/15 0853 Last data filed at 08/23/15 0446  Gross per 24 hour  Intake   1200 ml  Output   1950 ml  Net   -750 ml    Exam Awake Alert, Oriented x 3, No new F.N deficits, Normal affect Rea.AT,PERRAL Supple Neck,No JVD, No cervical lymphadenopathy appriciated.  Symmetrical Chest wall movement, Good air movement  bilaterally, CTAB RRR,No Gallops,Rubs or new Murmurs, No Parasternal Heave +ve B.Sounds, Abd Soft, Non tender, No organomegaly appriciated, No rebound -guarding or rigidity. No Cyanosis, Clubbing, mild edema, No new Rash or bruise  Data Review   CBC w Diff:  Lab Results  Component Value Date   WBC 5.1 08/23/2015   HGB 12.9* 08/23/2015   HCT 37.9* 08/23/2015   PLT 171 08/23/2015   LYMPHOPCT 7* 08/19/2015   MONOPCT 6 08/19/2015   EOSPCT 1 08/19/2015   BASOPCT 0 08/19/2015    CMP:  Lab Results  Component Value Date   NA 140 08/23/2015   K 4.2 08/23/2015   CL 102 08/23/2015   CO2 28 08/23/2015   BUN 15 08/23/2015   CREATININE 1.10 08/23/2015   PROT 7.2 08/19/2015   ALBUMIN 4.2 08/19/2015   BILITOT 0.6 08/19/2015   ALKPHOS 83 08/19/2015   AST 31 08/19/2015   ALT 36 08/19/2015  .   Total Time in preparing paper work, data evaluation and todays exam - 35 minutes  Thurnell Lose M.D on 08/23/2015 at 8:53 AM  [facility]   Office  [phone]

## 2015-08-23 NOTE — Progress Notes (Signed)
SATURATION QUALIFICATIONS: (This note is used to comply with regulatory documentation for home oxygen)  Patient Saturations on Room Air at Rest = 90%  Patient Saturations on Room Air while Ambulating = 92%  Patient Saturations on 0 Liters of oxygen while Ambulating = 92%  Please briefly explain why patient needs home oxygen: 

## 2015-08-23 NOTE — Progress Notes (Signed)
Called by RN re: hydration  Pt with CHF, rec'd IV Lasix earlier this admit.   Will not use 3 ml/kg>>1 ml/kg per protocol. Will use 75 cc/hr.  Lenoard Aden 08/23/2015 7:57 AM Beeper [phone]

## 2015-08-23 NOTE — Progress Notes (Signed)
Pt vomited moderate amount of breakfast at around 1125 am.  Pt states "I think sun from the window made me sick."  T98.3, P66, BP 120/51.  Family at the bedside.   Also pt urine noted to be slightly dark.  Dr. Candiss Norse made aware.  Ordered zofran 4mg  po x1 .and MD instructed to monitor pt for about 30 minutes and if pt feel better can be d/c home.  Pt and family made aware.  Will continue to monitor.  Karie Kirks, Therapist, sports.

## 2015-08-23 NOTE — Discharge Instructions (Signed)
Follow with Primary MD Simona Huh, MD in 7 days   Get CBC, CMP, 2 view Chest X ray checked  by Primary MD next visit.    Activity: As tolerated with Full fall precautions use walker/cane & assistance as needed   Disposition Home     Diet: Heart Healthy Low Carb.  Check your Weight same time everyday, if you gain over 2 pounds, or you develop in leg swelling, experience more shortness of breath or chest pain, call your Primary MD immediately. Follow Cardiac Low Salt Diet and 1.5 lit/day fluid restriction.   On your next visit with your primary care physician please Get Medicines reviewed and adjusted.   Please request your Prim.MD to go over all Hospital Tests and Procedure/Radiological results at the follow up, please get all Hospital records sent to your Prim MD by signing hospital release before you go home.   If you experience worsening of your admission symptoms, develop shortness of breath, life threatening emergency, suicidal or homicidal thoughts you must seek medical attention immediately by calling 911 or calling your MD immediately  if symptoms less severe.  You Must read complete instructions/literature along with all the possible adverse reactions/side effects for all the Medicines you take and that have been prescribed to you. Take any new Medicines after you have completely understood and accpet all the possible adverse reactions/side effects.   Do not drive, operating heavy machinery, perform activities at heights, swimming or participation in water activities or provide baby sitting services if your were admitted for syncope or siezures until you have seen by Primary MD or a Neurologist and advised to do so again.  Do not drive when taking Pain medications.    Do not take more than prescribed Pain, Sleep and Anxiety Medications  Special Instructions: If you have smoked or chewed Tobacco  in the last 2 yrs please stop smoking, stop any regular Alcohol  and or any  Recreational drug use.  Wear Seat belts while driving.   Please note  You were cared for by a hospitalist during your hospital stay. If you have any questions about your discharge medications or the care you received while you were in the hospital after you are discharged, you can call the unit and asked to speak with the hospitalist on call if the hospitalist that took care of you is not available. Once you are discharged, your primary care physician will handle any further medical issues. Please note that NO REFILLS for any discharge medications will be authorized once you are discharged, as it is imperative that you return to your primary care physician (or establish a relationship with a primary care physician if you do not have one) for your aftercare needs so that they can reassess your need for medications and monitor your lab values.

## 2015-08-23 NOTE — Progress Notes (Signed)
At 1327 pt States " I am felling well and am ready to go home.:  Pt no c/o N/V after zofran given.  All d/c instructions explained and given to pt and his wife.  Verbalized understanding.  D/c off floor by volunteer services to awaiting transport.  Karie Kirks, Therapist, sports.

## 2015-08-23 NOTE — Care Management Note (Signed)
Case Management Note  Patient Details  Name: Travis [name] MRN: [id number] Date of Birth: 08-Mar-1946  Subjective/Objective:      Admitted with Sepsis              Action/Plan: Patient lives at home with spouse, continues to drive and helps his wife with the activities of daily living. He has a walker that he use at home and goes to Outpatient Physical Therapy for PT for about one year and plans to continue doing that. PCP: Simona Huh, MD; has private insurance with BCBS with prescription drug coverage and states that he has no problem getting his medication; pharmacy of choice is [facility] in [place]. No oxygen needed at this time. Patient plans to follow up with his PCP after discharge.  Expected Discharge Date:                  Expected Discharge Plan:  Home/Self Care  In-House Referral:     Discharge planning Services  CM Consult Choice offered to:  Patient    HH Arranged:  Patient Refused - goes to Outpatient PT   Status of Service:  In process, will continue to follow  Medicare Important Message Given:  [facility] notification given  Sherrilyn Rist [phone] 08/23/2015, 10:36 AM

## 2015-08-23 NOTE — Progress Notes (Addendum)
PROGRESS NOTE  Subjective:   Travis [name] is a 69 yo male with PMHx of HTN, OSA on CPAP at home, chronic systolic CHF, [id number], and hypothyroidism who presented to the ED on 08/19/15 with complaint of shortness of breath, nausea and fever.  Is doing much better.    Objective:    Vital Signs:   Temp:  [98.3 F (36.8 C)-100.2 F (37.9 C)] 98.3 F (36.8 C) (08/25 0446) Pulse Rate:  [71-101] 88 (08/25 0108) Resp:  [18-21] 20 (08/25 0446) BP: (151-195)/(61-96) 151/72 mmHg (08/25 0446) SpO2:  [90 %-94 %] 92 % (08/25 0446) Weight:  [126.871 kg (279 lb 11.2 oz)] 126.871 kg (279 lb 11.2 oz) (08/25 0446)  Last BM Date: 08/22/15   24-hour weight change: Weight change: -1.429 kg (-3 lb 2.4 oz)  Weight trends: Filed Weights   08/19/15 2100 08/22/15 0522 08/23/15 0446  Weight: 129.6 kg (285 lb 11.5 oz) 128.3 kg (282 lb 13.6 oz) 126.871 kg (279 lb 11.2 oz)    Intake/Output:  08/24 0701 - 08/25 0700 In: 1200 [P.O.:840; I.V.:110; IV Piggyback:250] Out: 2400 [Urine:2400]     Physical Exam: BP 151/72 mmHg  Pulse 88  Temp(Src) 98.3 F (36.8 C) (Oral)  Resp 20  Ht 6\' 1"  (1.854 m)  Wt 126.871 kg (279 lb 11.2 oz)  BMI 36.91 kg/m2  SpO2 92%  Wt Readings from Last 3 Encounters:  08/23/15 126.871 kg (279 lb 11.2 oz)  08/17/15 128.096 kg (282 lb 6.4 oz)  03/19/15 124.739 kg (275 lb)    General: Vital signs reviewed and noted.   Appears very comfortable   Head: Normocephalic, atraumatic.  Eyes: conjunctivae/corneas clear.  EOM's intact.   Throat: normal  Neck:  normal   Lungs:  Clear posteriorly   Heart:   RR   Abdomen:  Soft, non-tender, non-distended    Extremities: 1+ pitting edema    Neurologic: A&O X3, CN II - XII are grossly intact.   Psych: Normal     Labs: BMET:  Recent Labs  08/22/15 0241 08/23/15 0631  NA 139 140  K 3.6 4.2  CL 103 102  CO2 30 28  GLUCOSE 109* 145*  BUN 16 15  CREATININE 1.15 1.10  CALCIUM 8.5* 9.3  MG  --  2.2    Liver function  tests: No results for input(s): AST, ALT, ALKPHOS, BILITOT, PROT, ALBUMIN in the last 72 hours. No results for input(s): LIPASE, AMYLASE in the last 72 hours.  CBC:  Recent Labs  08/22/15 1659 08/23/15 0631  WBC 6.7 5.1  HGB 13.0 12.9*  HCT 38.4* 37.9*  MCV 93.7 93.3  PLT 157 171    Cardiac Enzymes:  Recent Labs  08/20/15 1458 08/20/15 2124 08/21/15 0145  TROPONINI 0.20* 0.17* 0.19*    Coagulation Studies: No results for input(s): LABPROT, INR in the last 72 hours.  Other: Invalid input(s): POCBNP No results for input(s): DDIMER in the last 72 hours. No results for input(s): HGBA1C in the last 72 hours.  Recent Labs  08/20/15 1458  CHOL 124  HDL 29*  LDLCALC 79  TRIG 82  CHOLHDL 4.3    Recent Labs  08/20/15 1030  TSH 0.299*   No results for input(s): VITAMINB12, FOLATE, FERRITIN, TIBC, IRON, RETICCTPCT in the last 72 hours.   Other results:  Tele   ( personally reviewed )  - NSR   Medications:    Infusions:    Scheduled Medications: . amLODipine  10 mg Oral  Daily  . antiseptic oral rinse  7 mL Mouth Rinse BID  . aspirin EC  81 mg Oral Daily  . baclofen  5 mg Oral TID  . bisacodyl  10 mg Rectal Daily  . enoxaparin (LOVENOX) injection  60 mg Subcutaneous Q24H  . escitalopram  10 mg Oral Daily  . fesoterodine  8 mg Oral Daily  . furosemide  40 mg Oral Daily  . gabapentin  100 mg Oral TID  . glimepiride  1 mg Oral Q breakfast  . hydrALAZINE  25 mg Oral 3 times per day  . insulin aspart  0-15 Units Subcutaneous TID WC  . insulin aspart  0-5 Units Subcutaneous QHS  . insulin aspart  3 Units Subcutaneous TID WC  . levofloxacin  500 mg Oral Daily  . levothyroxine  175 mcg Oral Daily  . potassium chloride  20 mEq Oral Daily  . pramipexole  0.5 mg Oral BID  . tamsulosin  0.4 mg Oral Daily    Assessment/ Plan:   Active Problems:   HTN (hypertension)   Chronic systolic CHF (congestive heart failure)   Sepsis   OSA on CPAP   Acute  respiratory failure with hypoxia   Type II diabetes mellitus   HCAP (healthcare-associated pneumonia)   Hypothyroid   AKI (acute kidney injury)  1. Acute on chronic diastolic dysfunction:  Exacerbated by pneumonia and temp of 104. Has had + troponin levels which are slowly improving  Better on lasix  He had recurrent fevers last night Will cancel the cath  I spent 15 minutes discussing the case with Dr. Candiss Norse Will plan on him going home on medical therapy since the cath is really elective. Had a nice meeting with patient and family this am for about 20 minutes.  I've stressed to the patient the importance of avoiding salt and prepared foods.     2. Pneumonia-  Had recurrent fever and resp. Distress last night Continue abx.   3. Essential HTN:  Continue current meds  We may be able to restart ARB as op but I wanted to see how his kidneys did. Will need to watch for worsening leg edema since he is now on amlodipine  contineu lasix BID now, can reduce to QD if he is very careful with salt .  Total time spent with patient , case review, documentation - 45 minutes.    Disposition:  Length of Stay: 4  [name], [name], MD, [facility] 08/23/2015, 8:49 AM Office [phone] Pager [phone]

## 2015-08-23 NOTE — Progress Notes (Signed)
Physical Therapy Treatment Patient Details Name: Travis [name] MRN: [id number] DOB: 11/16/1946 Today's Date: 08/23/2015    History of Present Illness Travis [name] is a 69 y.o. male with a history of combined Systolic and diastolic CHF, HTN, DM2 and OSA who presents to the Ed with complaints of fevers and chills, SOB and nausea and vomiting.Chest X-Ray revealed a Left Basilar Pneumonia and he was found to have an elevated lactic acid and the Sepsis Protocol was initiated.     PT Comments    Pt moving well but having significant difficulty with sit to stand from all surfaces, slow and labored gait. Sats remained 90-93% on RA with all activity and left pt on RA with RN aware. Pt states continued decline for last 4 days of admission regarding mobility and will benefit from HHPT at D/C. Will continue to follow acutely.   Follow Up Recommendations  Home health PT;Supervision for mobility/OOB     Equipment Recommendations  None recommended by PT    Recommendations for Other Services       Precautions / Restrictions Precautions Precautions: Fall    Mobility  Bed Mobility               General bed mobility comments: not tested  Transfers Overall transfer level: Needs assistance   Transfers: Sit to/from Stand Sit to Stand: Min assist         General transfer comment: cues for anterior translation with assist for elevation and significantly increased time to achieve standing  Ambulation/Gait Ambulation/Gait assistance: Min guard Ambulation Distance (Feet): 180 Feet Assistive device: Rolling walker (2 wheeled) Gait Pattern/deviations: Step-through pattern;Decreased stride length;Trunk flexed   Gait velocity interpretation: Below normal speed for age/gender General Gait Details: cues for posture, position in RW, breathing technique with slow shuffled gait   Stairs            Wheelchair Mobility    Modified Rankin (Stroke Patients Only)        Balance Overall balance assessment: Needs assistance   Sitting balance-Leahy Scale: Good       Standing balance-Leahy Scale: Poor                      Cognition Arousal/Alertness: Awake/alert Behavior During Therapy: WFL for tasks assessed/performed Overall Cognitive Status: Within Functional Limits for tasks assessed                      Exercises      General Comments        Pertinent Vitals/Pain Pain Assessment: No/denies pain  HR 66-74    Home Living                      Prior Function            PT Goals (current goals can now be found in the care plan section) Progress towards PT goals: Progressing toward goals    Frequency       PT Plan Discharge plan needs to be updated    Co-evaluation             End of Session   Activity Tolerance: Patient tolerated treatment well Patient left: Other (comment);with call bell/phone within reach (in bathroom)     Time: [id number] PT Time Calculation (min) (ACUTE ONLY): 18 min  Charges:  $Gait Training: 8-22 mins  G CodesMelford Aase 08/28/15, 10:15 AM Elwyn Reach, [place]

## 2015-08-24 LAB — CULTURE, BLOOD (ROUTINE X 2)
CULTURE: NO GROWTH
Culture: NO GROWTH

## 2015-08-26 NOTE — Progress Notes (Signed)
Cardiology Office Note   Date:  08/27/2015   ID:  Travis, [name] Jan 23, 1946, MRN [id number]  PCP:  Simona Huh, MD  Cardiologist:  Dr. Liam Rogers   Electrophysiologist:  n/a  Chief Complaint  Patient presents with  . Hospitalization Follow-up    Pneumonia, CHF, + Troponins     History of Present Illness: Travis [name] is a 69 y.o. male with a hx of HTN, hypothyroidism, sleep apnea, DM2, prior smoker, DJD. Admitted in 123456 with diastolic HF setting of pneumonia/respiratory failure and septic shock. Myoview in 3/16 demonstrated mildly reduced ejection fraction at 48%. Prior echocardiogram demonstrated normal LV function. Last seen by Dr. Acie Fredrickson 08/17/15.  Admitted 8/21-8/25 with hypoxic respiratory failure in the setting of pneumonia complicated by acute diastolic HF and elevated troponin levels, AKI.  ARB was held. Echocardiogram demonstrated normal LV function, moderate diastolic dysfunction and no regional wall motion abnormalities. He was followed by cardiology (Dr. Acie Fredrickson). Right and left sided cardiac catheterization was planned. However, the patient spiked fevers again prior to discharge. It was decided to place cardiac catheterization on hold.  It appears that this could be considered as an outpatient.   He returns for FU.  Here today with his wife. Overall stable since DC.  His weight has been stable.  Breathing is stable.  He remains short of breath with minimal activity.  He is NYHA 3.  He denies orthopnea.  Sleeps with CPAP.  No syncope. No chest pain.  He notes bilateral ankle edema without significant change since DC.     Studies/Reports Reviewed Today:  Echo 08/21/15 EF 55-60%, normal wall motion, grade 2 diastolic dysfunction, mild LAE, trace TR  Myoview 03/22/15 EF 48%, no ischemia; Low Risk    Past Medical History  Diagnosis Date  . PONV (postoperative nausea and vomiting)   . Hypertension   . DJD (degenerative joint disease)     WHOLE SPINE  .  Hyperthyroidism     Had Iodine to resolve issue DX 10 YR AGO  . Hypothyroidism     Low d/t treatment of hyperthyroidism  . Sleep apnea     DX  2009/2010 study done at [facility]  . Type II diabetes mellitus     diet controlled  . Heart murmur     Evaluated by cardiologist 4-5 years ago but does not currently see a cardiologist  . Gait disorder   . Cervical myelopathy     with myelomalacia at C5-6 and C3-4  . RLS (restless legs syndrome)   . Peripheral neuropathy   . 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 Constipation - functional     controlled with miralax  . RBBB   . 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 CHF (congestive heart failure)   . Pneumonia   . Restless legs syndrome 03/14/2015  . Renal disorder     acute kidney failure    Past Surgical History  Procedure Laterality Date  . Cholecystectomy    . Back surgery      6 surgeries  . Lumbar laminectomy/decompression microdiscectomy  07/14/2012    Procedure: LUMBAR LAMINECTOMY/DECOMPRESSION MICRODISCECTOMY 2 LEVELS;  Surgeon: Eustace Moore, MD;  Location: [facility];  Service: Neurosurgery;  Laterality: Left;  Lumbar two three laminectomy, left thoracic four five transpedicular diskectomy     Current Outpatient Prescriptions  Medication Sig Dispense Refill  . amLODipine (NORVASC) 10 MG tablet Take 1 tablet (10 mg total) by mouth daily. [place]  tablet 0  . aspirin EC 81 MG EC tablet Take 1 tablet (81 mg total) by mouth daily. 30 tablet 0  . baclofen (LIORESAL) 10 MG tablet Take 1 tablet (10 mg total) by mouth 3 (three) times daily. For muscle spasms 90 each 1  . diclofenac (VOLTAREN) 75 MG EC tablet Take 75 mg by mouth daily.  2  . escitalopram (LEXAPRO) 10 MG tablet Take 10 mg by mouth daily.  2  . furosemide (LASIX) 40 MG tablet Take 40 mg by mouth 2 (two) times daily.    [name] gabapentin (NEURONTIN) 400 MG capsule TAKE 1 CAPSULE (400 MG TOTAL) BY MOUTH EVERY 8 (EIGHT) HOURS. 90 capsule 0  . glimepiride (AMARYL) 2 MG tablet  Take 2 mg by mouth daily with breakfast.    . hydrALAZINE (APRESOLINE) 25 MG tablet Take 1 tablet (25 mg total) by mouth every 8 (eight) hours. 90 tablet 0  . KLOR-CON 10 10 MEQ tablet TAKE 1 TABLET (10 MEQ TOTAL) BY MOUTH DAILY. 30 tablet 6  . levofloxacin (LEVAQUIN) 500 MG tablet Take 1 tablet (500 mg total) by mouth daily. 6 tablet 0  . levothyroxine (SYNTHROID, LEVOTHROID) 175 MCG tablet Take 175 mcg by mouth daily.  2  . polyethylene glycol (MIRALAX / GLYCOLAX) packet Take 17 g by mouth daily as needed. For constipation 14 each 0  . pramipexole (MIRAPEX) 0.5 MG tablet Take 0.5 mg by mouth 2 (two) times daily.     . tamsulosin (FLOMAX) 0.4 MG CAPS capsule Take 0.4 mg by mouth daily.     . TOVIAZ 8 MG TB24 tablet Take 8 mg by mouth daily.  11   No current facility-administered medications for this visit.    Allergies:   Other    Social History:  The patient  reports that he quit smoking about 30 years ago. His smoking use included Cigarettes. He has a 40 pack-year smoking history. He quit smokeless tobacco use about 30 years ago. He reports that he does not drink alcohol or use illicit drugs.   Family History:  The patient's family history includes CAD in an other family member; Diabetes in his father; Heart disease in his father; Hypertension in his father; Polycythemia in his mother.    ROS:   Please see the history of present illness.   Review of Systems  Cardiovascular: Positive for dyspnea on exertion and leg swelling.     PHYSICAL EXAM: VS:  BP 124/60 mmHg  Pulse 59  Ht 6\' 1"  (1.854 m)  Wt 278 lb (126.1 kg)  BMI 36.69 kg/m2    Wt Readings from Last 3 Encounters:  08/27/15 278 lb (126.1 kg)  08/23/15 279 lb 11.2 oz (126.871 kg)  08/17/15 282 lb 6.4 oz (128.096 kg)     GEN: Well nourished, well developed, in no acute distress HEENT: normal Neck: no JVD at 90 degrees, no masses Cardiac:  Normal S1/S2, RRR; no murmur ,  no rubs or gallops, trace to 1+ bilat LE edema    Respiratory:  Decrease breath sounds bilaterally, no wheezing, rhonchi or rales. GI: soft, nontender, nondistended, + BS MS: no deformity or atrophy Skin: warm and dry  Neuro:  CNs II-XII intact, Strength and sensation are intact Psych: Normal affect   EKG:  EKG is ordered today.  It demonstrates:    Sinus bradycardia, HR 59, RBBB, no change from prior tracing   Recent Labs: 08/19/2015: ALT 36; B Natriuretic Peptide 94.3 08/20/2015: TSH 0.299* 08/23/2015: Magnesium 2.2 08/27/2015:  BUN 32*; Creatinine, Ser 1.53*; Hemoglobin 12.7*; Platelets 245.0; Potassium 4.1; Sodium 140    Lipid Panel    Component Value Date/Time   CHOL 124 08/20/2015 1458   TRIG 82 08/20/2015 1458   HDL 29* 08/20/2015 1458   CHOLHDL 4.3 08/20/2015 1458   VLDL 16 08/20/2015 1458   LDLCALC 79 08/20/2015 1458      ASSESSMENT AND PLAN:  Chronic diastolic CHF (congestive heart failure):  Volume appears stable.  Continue current dose of Lasix. Repeat BMET today.    Elevated troponin I level:  This was likely demand ischemia. However, he has significant risk factors to include diabetes.  He has had 2 admissions with decompensated HF.  R and L heart cath was considered in the hospital, but was deferred in the setting of recent pneumonia.  I reviewed with Dr. Liam Rogers this AM.  We will allow him to recover more from his pneumonia before proceeding with this cardiac cath.  I will arrange this for late next week to allow him about 2 more weeks before proceeding.  Risks and benefits of cardiac catheterization have been discussed with the patient.  These include bleeding, infection, kidney damage, stroke, heart attack, death.  The patient understands these risks and is willing to proceed.   Essential hypertension:  Controlled.  Will eventually try to resume angiotensin receptor blocker at some point.   CAP (community acquired pneumonia):  Recovered.   AKI (acute kidney injury):  Repeat BMET today   Type 2 diabetes  mellitus without complication:  FU with PCP.  Hold glimepiride day of cardiac catheterization.    Medication Changes: Current medicines are reviewed at length with the patient today.  Concerns regarding medicines are as outlined above.  The following changes have been made:   Discontinued Medications   No medications on file   Modified Medications   No medications on file   New Prescriptions   No medications on file    Labs/ tests ordered today include:   Orders Placed This Encounter  Procedures  . Basic Metabolic Panel (BMET)  . CBC w/Diff  . INR/PT  . EKG 12-Lead      Disposition:    FU with Dr. Liam Rogers 4 weeks.    Signed, Versie Starks, MHS 08/27/2015 5:11 PM    [facility] [place], [place], [place]  [address] Phone: [phone]; Fax: [phone]

## 2015-08-27 ENCOUNTER — Encounter: Payer: Self-pay | Admitting: *Deleted

## 2015-08-27 ENCOUNTER — Encounter: Payer: Self-pay | Admitting: Physician Assistant

## 2015-08-27 ENCOUNTER — Telehealth: Payer: Self-pay | Admitting: *Deleted

## 2015-08-27 ENCOUNTER — Ambulatory Visit (INDEPENDENT_AMBULATORY_CARE_PROVIDER_SITE_OTHER): Payer: Medicare Other | Admitting: Physician Assistant

## 2015-08-27 VITALS — BP 124/60 | HR 59 | Ht 73.0 in | Wt 278.0 lb

## 2015-08-27 DIAGNOSIS — I10 Essential (primary) hypertension: Secondary | ICD-10-CM

## 2015-08-27 DIAGNOSIS — J189 Pneumonia, unspecified organism: Secondary | ICD-10-CM | POA: Diagnosis not present

## 2015-08-27 DIAGNOSIS — E119 Type 2 diabetes mellitus without complications: Secondary | ICD-10-CM

## 2015-08-27 DIAGNOSIS — R0602 Shortness of breath: Secondary | ICD-10-CM

## 2015-08-27 DIAGNOSIS — I5032 Chronic diastolic (congestive) heart failure: Secondary | ICD-10-CM

## 2015-08-27 DIAGNOSIS — R778 Other specified abnormalities of plasma proteins: Secondary | ICD-10-CM

## 2015-08-27 DIAGNOSIS — R7989 Other specified abnormal findings of blood chemistry: Secondary | ICD-10-CM

## 2015-08-27 DIAGNOSIS — N179 Acute kidney failure, unspecified: Secondary | ICD-10-CM

## 2015-08-27 DIAGNOSIS — I5031 Acute diastolic (congestive) heart failure: Secondary | ICD-10-CM

## 2015-08-27 LAB — CBC WITH DIFFERENTIAL/PLATELET
BASOS PCT: 0.8 % (ref 0.0–3.0)
Basophils Absolute: 0 10*3/uL (ref 0.0–0.1)
EOS ABS: 0.1 10*3/uL (ref 0.0–0.7)
EOS PCT: 3.1 % (ref 0.0–5.0)
HCT: 37.3 % — ABNORMAL LOW (ref 39.0–52.0)
Hemoglobin: 12.7 g/dL — ABNORMAL LOW (ref 13.0–17.0)
LYMPHS ABS: 1.1 10*3/uL (ref 0.7–4.0)
Lymphocytes Relative: 23.9 % (ref 12.0–46.0)
MCHC: 34 g/dL (ref 30.0–36.0)
MCV: 93.7 fl (ref 78.0–100.0)
MONO ABS: 0.4 10*3/uL (ref 0.1–1.0)
Monocytes Relative: 9.3 % (ref 3.0–12.0)
NEUTROS ABS: 2.8 10*3/uL (ref 1.4–7.7)
Neutrophils Relative %: 62.9 % (ref 43.0–77.0)
PLATELETS: 245 10*3/uL (ref 150.0–400.0)
RBC: 3.98 Mil/uL — ABNORMAL LOW (ref 4.22–5.81)
RDW: 14.1 % (ref 11.5–15.5)
WBC: 4.5 10*3/uL (ref 4.0–10.5)

## 2015-08-27 LAB — BASIC METABOLIC PANEL
BUN: 32 mg/dL — AB (ref 6–23)
CHLORIDE: 103 meq/L (ref 96–112)
CO2: 29 meq/L (ref 19–32)
CREATININE: 1.53 mg/dL — AB (ref 0.40–1.50)
Calcium: 9.6 mg/dL (ref 8.4–10.5)
GFR: 47.97 mL/min — ABNORMAL LOW (ref >60.00)
GLUCOSE: 85 mg/dL (ref 70–99)
Potassium: 4.1 mEq/L (ref 3.5–5.1)
Sodium: 140 mEq/L (ref 135–145)

## 2015-08-27 LAB — PROTIME-INR
INR: 1 ratio (ref 0.8–1.0)
PROTHROMBIN TIME: 10.9 s (ref 9.6–13.1)

## 2015-08-27 NOTE — Patient Instructions (Signed)
Medication Instructions:  Your physician recommends that you continue on your current medications as directed. Please refer to the Current Medication list given to you today.    Labwork: TODAY BMET, CBC W/DIFF, PT/INR  Testing/Procedures: Your physician has requested that you have a cardiac catheterization. Cardiac catheterization is used to diagnose and/or treat various heart conditions. Doctors may recommend this procedure for a number of different reasons. The most common reason is to evaluate chest pain. Chest pain can be a symptom of coronary artery disease (CAD), and cardiac catheterization can show whether plaque is narrowing or blocking your heart's arteries. This procedure is also used to evaluate the valves, as well as measure the blood flow and oxygen levels in different parts of your heart. For further information please visit [email or web address]. Please follow instruction sheet, as given.    Follow-Up: 4-6 WEEKS DR. Acie Fredrickson; S/P CATH  Any Other Special Instructions Will Be Listed Below (If Applicable).

## 2015-08-27 NOTE — Telephone Encounter (Signed)
Lmptcb on both home ande cell # for ptcb to go over lab results and recommendations.

## 2015-08-28 ENCOUNTER — Telehealth: Payer: Self-pay | Admitting: Physician Assistant

## 2015-08-28 NOTE — Telephone Encounter (Signed)
Ptcb and has a question about our conversation from this AM. See lab results from yesterday for further information.

## 2015-08-28 NOTE — Telephone Encounter (Signed)
Pt notfied of lab results by phone and lasix dose change, bmet, 9/6. Pt advised to monitor weight and call if up 3 # x 1 day or increase SOB, EDEMA. Pt said ok and thank you for our help.

## 2015-08-28 NOTE — Telephone Encounter (Signed)
New message      Pt talked to Arbie Cookey this am.  He has a question related to the morning conversation. Please call at your convenience

## 2015-08-31 ENCOUNTER — Telehealth: Payer: Self-pay | Admitting: *Deleted

## 2015-08-31 NOTE — Telephone Encounter (Signed)
S/w pt's wife [name], [place] on file; gave recommedation from [place]. PA to d/c Voltaren; if he needs he can take Tylenol. Wife verbalized understanding to plan of care.

## 2015-08-31 NOTE — Telephone Encounter (Signed)
Follow up    Pt wife calling to see if pt needs to stop aspirin prior to cath.

## 2015-08-31 NOTE — Telephone Encounter (Signed)
I s/w pt's wife today. I advised tried tcb the other day. she states having problem w/cell phone. I answered her question about the ASA and cath. Advised pt is supposed to take ASA morning of cath. She also verified lasix dose is 60 mg daily, I said yes.

## 2015-09-04 ENCOUNTER — Other Ambulatory Visit (INDEPENDENT_AMBULATORY_CARE_PROVIDER_SITE_OTHER): Payer: Medicare Other | Admitting: *Deleted

## 2015-09-04 DIAGNOSIS — I5031 Acute diastolic (congestive) heart failure: Secondary | ICD-10-CM | POA: Diagnosis not present

## 2015-09-04 DIAGNOSIS — I10 Essential (primary) hypertension: Secondary | ICD-10-CM

## 2015-09-04 LAB — BASIC METABOLIC PANEL
BUN: 31 mg/dL — AB (ref 6–23)
CHLORIDE: 103 meq/L (ref 96–112)
CO2: 28 meq/L (ref 19–32)
CREATININE: 1.13 mg/dL (ref 0.40–1.50)
Calcium: 9.7 mg/dL (ref 8.4–10.5)
GFR: 68.05 mL/min (ref >60.00)
GLUCOSE: 122 mg/dL — AB (ref 70–99)
POTASSIUM: 4.3 meq/L (ref 3.5–5.1)
Sodium: 140 mEq/L (ref 135–145)

## 2015-09-05 ENCOUNTER — Telehealth: Payer: Self-pay | Admitting: *Deleted

## 2015-09-05 NOTE — Telephone Encounter (Signed)
Pt notified of lab results by phone with verbal understanding. Pt verified his cath time for 09/07/15 with Dr. [place].

## 2015-09-07 ENCOUNTER — Ambulatory Visit (HOSPITAL_COMMUNITY)
Admission: RE | Admit: 2015-09-07 | Discharge: 2015-09-07 | Disposition: A | Discharge status: Home / Self Care | Payer: Medicare Other | Source: Ambulatory Visit | Attending: Cardiology | Admitting: Cardiology

## 2015-09-07 ENCOUNTER — Encounter (HOSPITAL_COMMUNITY)
Admission: RE | Disposition: A | Discharge status: Home / Self Care | Payer: Medicare Other | Source: Ambulatory Visit | Attending: Cardiology

## 2015-09-07 DIAGNOSIS — G4730 Sleep apnea, unspecified: Secondary | ICD-10-CM | POA: Insufficient documentation

## 2015-09-07 DIAGNOSIS — Z7982 Long term (current) use of aspirin: Secondary | ICD-10-CM | POA: Insufficient documentation

## 2015-09-07 DIAGNOSIS — E1140 Type 2 diabetes mellitus with diabetic neuropathy, unspecified: Secondary | ICD-10-CM | POA: Insufficient documentation

## 2015-09-07 DIAGNOSIS — R7989 Other specified abnormal findings of blood chemistry: Secondary | ICD-10-CM

## 2015-09-07 DIAGNOSIS — Z8249 Family history of ischemic heart disease and other diseases of the circulatory system: Secondary | ICD-10-CM | POA: Insufficient documentation

## 2015-09-07 DIAGNOSIS — I272 Other secondary pulmonary hypertension: Secondary | ICD-10-CM | POA: Diagnosis not present

## 2015-09-07 DIAGNOSIS — I2510 Atherosclerotic heart disease of native coronary artery without angina pectoris: Secondary | ICD-10-CM | POA: Diagnosis not present

## 2015-09-07 DIAGNOSIS — I10 Essential (primary) hypertension: Secondary | ICD-10-CM | POA: Insufficient documentation

## 2015-09-07 DIAGNOSIS — R778 Other specified abnormalities of plasma proteins: Secondary | ICD-10-CM | POA: Insufficient documentation

## 2015-09-07 DIAGNOSIS — I5032 Chronic diastolic (congestive) heart failure: Secondary | ICD-10-CM | POA: Insufficient documentation

## 2015-09-07 DIAGNOSIS — Z87891 Personal history of nicotine dependence: Secondary | ICD-10-CM | POA: Diagnosis not present

## 2015-09-07 DIAGNOSIS — R0602 Shortness of breath: Secondary | ICD-10-CM

## 2015-09-07 DIAGNOSIS — E039 Hypothyroidism, unspecified: Secondary | ICD-10-CM | POA: Insufficient documentation

## 2015-09-07 DIAGNOSIS — N179 Acute kidney failure, unspecified: Secondary | ICD-10-CM | POA: Diagnosis not present

## 2015-09-07 DIAGNOSIS — I5033 Acute on chronic diastolic (congestive) heart failure: Secondary | ICD-10-CM | POA: Insufficient documentation

## 2015-09-07 HISTORY — PX: CARDIAC CATHETERIZATION: SHX172

## 2015-09-07 LAB — POCT I-STAT 3, ART BLOOD GAS (G3+)
ACID-BASE DEFICIT: 1 mmol/L (ref 0.0–2.0)
Bicarbonate: 23.4 mEq/L (ref 20.0–24.0)
O2 Saturation: 91 %
PH ART: 7.392 (ref 7.350–7.450)
PO2 ART: 62 mmHg — AB (ref 80.0–100.0)
TCO2: 25 mmol/L (ref 0–100)
pCO2 arterial: 38.4 mmHg (ref 35.0–45.0)

## 2015-09-07 LAB — POCT I-STAT 3, VENOUS BLOOD GAS (G3P V)
BICARBONATE: 25.6 meq/L — AB (ref 20.0–24.0)
BICARBONATE: 25.2 meq/L — AB (ref 20.0–24.0)
O2 SAT: 61 %
O2 Saturation: 66 %
PH VEN: 7.372 — AB (ref 7.250–7.300)
PH VEN: 7.359 — AB (ref 7.250–7.300)
TCO2: 27 mmol/L (ref 0–100)
TCO2: 26 mmol/L (ref 0–100)
pCO2, Ven: 44 mmHg — ABNORMAL LOW (ref 45.0–50.0)
pCO2, Ven: 44.6 mmHg — ABNORMAL LOW (ref 45.0–50.0)
pO2, Ven: 33 mmHg (ref 30.0–45.0)
pO2, Ven: 36 mmHg (ref 30.0–45.0)

## 2015-09-07 LAB — GLUCOSE, CAPILLARY: Glucose-Capillary: 120 mg/dL — ABNORMAL HIGH (ref 65–99)

## 2015-09-07 SURGERY — RIGHT/LEFT HEART CATH AND CORONARY ANGIOGRAPHY
Anesthesia: LOCAL

## 2015-09-07 MED ORDER — SODIUM CHLORIDE 0.9 % IV SOLN: 250.0000 mL | INTRAVENOUS | Status: DC | PRN

## 2015-09-07 MED ORDER — ASPIRIN 81 MG PO CHEW
81.0000 mg | CHEWABLE_TABLET | ORAL | Status: AC
  Administered 2015-09-07: 81 mg via ORAL

## 2015-09-07 MED ORDER — MIDAZOLAM HCL 2 MG/2ML IJ SOLN
INTRAMUSCULAR | Status: AC
  Filled 2015-09-07: qty 4

## 2015-09-07 MED ORDER — FENTANYL CITRATE (PF) 100 MCG/2ML IJ SOLN
INTRAMUSCULAR | Status: DC | PRN
  Administered 2015-09-07: 25 ug via INTRAVENOUS

## 2015-09-07 MED ORDER — SODIUM CHLORIDE 0.9 % WEIGHT BASED INFUSION: 1.0000 mL/kg/h | INTRAVENOUS | Status: AC

## 2015-09-07 MED ORDER — ASPIRIN 81 MG PO CHEW
CHEWABLE_TABLET | ORAL | Status: AC
  Filled 2015-09-07: qty 1

## 2015-09-07 MED ORDER — HEPARIN SODIUM (PORCINE) 1000 UNIT/ML IJ SOLN
INTRAMUSCULAR | Status: AC
  Filled 2015-09-07: qty 1

## 2015-09-07 MED ORDER — MIDAZOLAM HCL 2 MG/2ML IJ SOLN
INTRAMUSCULAR | Status: DC | PRN
  Administered 2015-09-07: 1 mg via INTRAVENOUS

## 2015-09-07 MED ORDER — HEPARIN SODIUM (PORCINE) 1000 UNIT/ML IJ SOLN
INTRAMUSCULAR | Status: DC | PRN
  Administered 2015-09-07: 6000 [IU] via INTRAVENOUS

## 2015-09-07 MED ORDER — SODIUM CHLORIDE 0.9 % IV SOLN
INTRAVENOUS | Status: DC
  Administered 2015-09-07: 10:00:00 via INTRAVENOUS

## 2015-09-07 MED ORDER — SODIUM CHLORIDE 0.9 % IJ SOLN: 3.0000 mL | Freq: Two times a day (BID) | INTRAMUSCULAR | Status: DC

## 2015-09-07 MED ORDER — VERAPAMIL HCL 2.5 MG/ML IV SOLN
INTRAVENOUS | Status: AC
  Filled 2015-09-07: qty 2

## 2015-09-07 MED ORDER — SODIUM CHLORIDE 0.9 % IJ SOLN: 3.0000 mL | INTRAMUSCULAR | Status: DC | PRN

## 2015-09-07 MED ORDER — VERAPAMIL HCL 2.5 MG/ML IV SOLN
INTRAVENOUS | Status: DC | PRN
  Administered 2015-09-07: 10 mL via INTRA_ARTERIAL

## 2015-09-07 MED ORDER — FENTANYL CITRATE (PF) 100 MCG/2ML IJ SOLN
INTRAMUSCULAR | Status: AC
  Filled 2015-09-07: qty 4

## 2015-09-07 MED ORDER — IOHEXOL 350 MG/ML SOLN
INTRAVENOUS | Status: DC | PRN
  Administered 2015-09-07: 85 mL via INTRA_ARTERIAL

## 2015-09-07 MED ORDER — LIDOCAINE HCL (PF) 1 % IJ SOLN
INTRAMUSCULAR | Status: AC
  Filled 2015-09-07: qty 30

## 2015-09-07 SURGICAL SUPPLY — 15 items
CATH BALLN WEDGE 5F 110CM (CATHETERS) ×1 IMPLANT
CATH INFINITI 5 FR JL3.5 (CATHETERS) ×2 IMPLANT
CATH INFINITI 5FR ANG PIGTAIL (CATHETERS) ×2 IMPLANT
CATH INFINITI JR4 5F (CATHETERS) ×2 IMPLANT
DEVICE RAD COMP TR BAND LRG (VASCULAR PRODUCTS) ×2 IMPLANT
GLIDESHEATH SLEND SS 6F .021 (SHEATH) ×2 IMPLANT
GUIDEWIRE .025 260CM (WIRE) ×1 IMPLANT
KIT HEART LEFT (KITS) ×2 IMPLANT
KIT HEART RIGHT NAMIC (KITS) ×2 IMPLANT
PACK CARDIAC CATHETERIZATION (CUSTOM PROCEDURE TRAY) ×2 IMPLANT
SHEATH FAST CATH BRACH 5F 5CM (SHEATH) ×1 IMPLANT
SYR MEDRAD MARK V 150ML (SYRINGE) ×2 IMPLANT
TRANSDUCER W/STOPCOCK (MISCELLANEOUS) ×3 IMPLANT
TUBING CIL FLEX 10 FLL-RA (TUBING) ×2 IMPLANT
WIRE SAFE-T 1.5MM-J .035X260CM (WIRE) ×2 IMPLANT

## 2015-09-07 NOTE — H&P (View-Only) (Signed)
Cardiology Office Note   Date:  08/27/2015   ID:  Travis, [name] [birth year], MRN [id number]  PCP:  Simona Huh, MD  Cardiologist:  Dr. Liam Rogers   Electrophysiologist:  n/a  Chief Complaint  Patient presents with  . Hospitalization Follow-up    Pneumonia, CHF, + Troponins     History of Present Illness: Travis [name] is a 69 y.o. male with a hx of HTN, hypothyroidism, sleep apnea, DM2, prior smoker, DJD. Admitted in [id number] with diastolic HF setting of pneumonia/respiratory failure and septic shock. Myoview in 3/16 demonstrated mildly reduced ejection fraction at 48%. Prior echocardiogram demonstrated normal LV function. Last seen by Dr. Acie Fredrickson 08/17/15.  Admitted 8/21-8/25 with hypoxic respiratory failure in the setting of pneumonia complicated by acute diastolic HF and elevated troponin levels, AKI.  ARB was held. Echocardiogram demonstrated normal LV function, moderate diastolic dysfunction and no regional wall motion abnormalities. He was followed by cardiology (Dr. Acie Fredrickson). Right and left sided cardiac catheterization was planned. However, the patient spiked fevers again prior to discharge. It was decided to place cardiac catheterization on hold.  It appears that this could be considered as an outpatient.   He returns for FU.  Here today with his wife. Overall stable since DC.  His weight has been stable.  Breathing is stable.  He remains short of breath with minimal activity.  He is NYHA 3.  He denies orthopnea.  Sleeps with CPAP.  No syncope. No chest pain.  He notes bilateral ankle edema without significant change since DC.     Studies/Reports Reviewed Today:  Echo 08/21/15 EF 55-60%, normal wall motion, grade 2 diastolic dysfunction, mild LAE, trace TR  Myoview 03/22/15 EF 48%, no ischemia; Low Risk    Past Medical History  Diagnosis Date  . PONV (postoperative nausea and vomiting)   . Hypertension   . DJD (degenerative joint disease)     WHOLE SPINE  .  Hyperthyroidism     Had Iodine to resolve issue DX 10 YR AGO  . Hypothyroidism     Low d/t treatment of hyperthyroidism  . Sleep apnea     DX  2009/2010 study done at [facility]  . Type II diabetes mellitus     diet controlled  . Heart murmur     Evaluated by cardiologist 4-5 years ago but does not currently see a cardiologist  . Gait disorder   . Cervical myelopathy     with myelomalacia at C5-6 and C3-4  . RLS (restless legs syndrome)   . Peripheral neuropathy   . 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 Constipation - functional     controlled with miralax  . RBBB   . 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 CHF (congestive heart failure)   . Pneumonia   . Restless legs syndrome 03/14/2015  . Renal disorder     acute kidney failure    Past Surgical History  Procedure Laterality Date  . Cholecystectomy    . Back surgery      6 surgeries  . Lumbar laminectomy/decompression microdiscectomy  07/14/2012    Procedure: LUMBAR LAMINECTOMY/DECOMPRESSION MICRODISCECTOMY 2 LEVELS;  Surgeon: Eustace Moore, MD;  Location: [place] [facility];  Service: Neurosurgery;  Laterality: Left;  Lumbar two three laminectomy, left thoracic four five transpedicular diskectomy     Current Outpatient Prescriptions  Medication Sig Dispense Refill  . amLODipine (NORVASC) 10 MG tablet Take 1 tablet (10 mg total) by mouth daily. [place]  tablet 0  . aspirin EC 81 MG EC tablet Take 1 tablet (81 mg total) by mouth daily. 30 tablet 0  . baclofen (LIORESAL) 10 MG tablet Take 1 tablet (10 mg total) by mouth 3 (three) times daily. For muscle spasms 90 each 1  . diclofenac (VOLTAREN) 75 MG EC tablet Take 75 mg by mouth daily.  2  . escitalopram (LEXAPRO) 10 MG tablet Take 10 mg by mouth daily.  2  . furosemide (LASIX) 40 MG tablet Take 40 mg by mouth 2 (two) times daily.    [name] gabapentin (NEURONTIN) 400 MG capsule TAKE 1 CAPSULE (400 MG TOTAL) BY MOUTH EVERY 8 (EIGHT) HOURS. 90 capsule 0  . glimepiride (AMARYL) 2 MG tablet  Take 2 mg by mouth daily with breakfast.    . hydrALAZINE (APRESOLINE) 25 MG tablet Take 1 tablet (25 mg total) by mouth every 8 (eight) hours. 90 tablet 0  . KLOR-CON 10 10 MEQ tablet TAKE 1 TABLET (10 MEQ TOTAL) BY MOUTH DAILY. 30 tablet 6  . levofloxacin (LEVAQUIN) 500 MG tablet Take 1 tablet (500 mg total) by mouth daily. 6 tablet 0  . levothyroxine (SYNTHROID, LEVOTHROID) 175 MCG tablet Take 175 mcg by mouth daily.  2  . polyethylene glycol (MIRALAX / GLYCOLAX) packet Take 17 g by mouth daily as needed. For constipation 14 each 0  . pramipexole (MIRAPEX) 0.5 MG tablet Take 0.5 mg by mouth 2 (two) times daily.     . tamsulosin (FLOMAX) 0.4 MG CAPS capsule Take 0.4 mg by mouth daily.     . TOVIAZ 8 MG TB24 tablet Take 8 mg by mouth daily.  11   No current facility-administered medications for this visit.    Allergies:   Other    Social History:  The patient  reports that he quit smoking about 30 years ago. His smoking use included Cigarettes. He has a 40 pack-year smoking history. He quit smokeless tobacco use about 30 years ago. He reports that he does not drink alcohol or use illicit drugs.   Family History:  The patient's family history includes CAD in an other family member; Diabetes in his father; Heart disease in his father; Hypertension in his father; Polycythemia in his mother.    ROS:   Please see the history of present illness.   Review of Systems  Cardiovascular: Positive for dyspnea on exertion and leg swelling.     PHYSICAL EXAM: VS:  BP 124/60 mmHg  Pulse 59  Ht 6\' 1"  (1.854 m)  Wt 278 lb (126.1 kg)  BMI 36.69 kg/m2    Wt Readings from Last 3 Encounters:  08/27/15 278 lb (126.1 kg)  08/23/15 279 lb 11.2 oz (126.871 kg)  08/17/15 282 lb 6.4 oz (128.096 kg)     GEN: Well nourished, well developed, in no acute distress HEENT: normal Neck: no JVD at 90 degrees, no masses Cardiac:  Normal S1/S2, RRR; no murmur ,  no rubs or gallops, trace to 1+ bilat LE edema    Respiratory:  Decrease breath sounds bilaterally, no wheezing, rhonchi or rales. GI: soft, nontender, nondistended, + BS MS: no deformity or atrophy Skin: warm and dry  Neuro:  CNs II-XII intact, Strength and sensation are intact Psych: Normal affect   EKG:  EKG is ordered today.  It demonstrates:    Sinus bradycardia, HR 59, RBBB, no change from prior tracing   Recent Labs: 08/19/2015: ALT 36; B Natriuretic Peptide 94.3 08/20/2015: TSH 0.299* 08/23/2015: Magnesium 2.2 08/27/2015:  BUN 32*; Creatinine, Ser 1.53*; Hemoglobin 12.7*; Platelets 245.0; Potassium 4.1; Sodium 140    Lipid Panel    Component Value Date/Time   CHOL 124 08/20/2015 1458   TRIG 82 08/20/2015 1458   HDL 29* 08/20/2015 1458   CHOLHDL 4.3 08/20/2015 1458   VLDL 16 08/20/2015 1458   LDLCALC 79 08/20/2015 1458      ASSESSMENT AND PLAN:  Chronic diastolic CHF (congestive heart failure):  Volume appears stable.  Continue current dose of Lasix. Repeat BMET today.    Elevated troponin I level:  This was likely demand ischemia. However, he has significant risk factors to include diabetes.  He has had 2 admissions with decompensated HF.  R and L heart cath was considered in the hospital, but was deferred in the setting of recent pneumonia.  I reviewed with Dr. Liam Rogers this AM.  We will allow him to recover more from his pneumonia before proceeding with this cardiac cath.  I will arrange this for late next week to allow him about 2 more weeks before proceeding.  Risks and benefits of cardiac catheterization have been discussed with the patient.  These include bleeding, infection, kidney damage, stroke, heart attack, death.  The patient understands these risks and is willing to proceed.   Essential hypertension:  Controlled.  Will eventually try to resume angiotensin receptor blocker at some point.   CAP (community acquired pneumonia):  Recovered.   AKI (acute kidney injury):  Repeat BMET today   Type 2 diabetes  mellitus without complication:  FU with PCP.  Hold glimepiride day of cardiac catheterization.    Medication Changes: Current medicines are reviewed at length with the patient today.  Concerns regarding medicines are as outlined above.  The following changes have been made:   Discontinued Medications   No medications on file   Modified Medications   No medications on file   New Prescriptions   No medications on file    Labs/ tests ordered today include:   Orders Placed This Encounter  Procedures  . Basic Metabolic Panel (BMET)  . CBC w/Diff  . INR/PT  . EKG 12-Lead      Disposition:    FU with Dr. Liam Rogers 4 weeks.    Signed, Versie Starks, MHS 08/27/2015 5:11 PM    [facility] [place], [place], [place]  [address] Phone: [phone]; Fax: [phone]

## 2015-09-07 NOTE — Interval H&P Note (Signed)
History and Physical Interval Note:  09/07/2015 11:53 AM  Travis [name]  has presented today for surgery, with the diagnosis of c/p  The various methods of treatment have been discussed with the patient and family. After consideration of risks, benefits and other options for treatment, the patient has consented to  Procedure(s): Right/Left Heart Cath and Coronary Angiography ([name]) as a surgical intervention .  The patient's history has been reviewed, patient examined, no change in status, stable for surgery.  I have reviewed the patient's chart and labs.  Questions were answered to the patient's satisfaction.   Cath Lab Visit (complete for each Cath Lab visit)  Clinical Evaluation Leading to the Procedure:   ACS: Yes.    Non-ACS:    Anginal Classification: CCS II  Anti-ischemic medical therapy: Minimal Therapy (1 class of medications)  Non-Invasive Test Results: No non-invasive testing performed  Prior CABG: No previous CABG        Travis [name] [facility] 09/07/2015 11:53 AM

## 2015-09-07 NOTE — Discharge Instructions (Signed)
Radial Site Care °Refer to this sheet in the next few weeks. These instructions provide you with information on caring for yourself after your procedure. Your caregiver may also give you more specific instructions. Your treatment has been planned according to current medical practices, but problems sometimes occur. Call your caregiver if you have any problems or questions after your procedure. °HOME CARE INSTRUCTIONS °· You may shower the day after the procedure. Remove the bandage (dressing) and gently wash the site with plain soap and water. Gently pat the site dry. °· Do not apply powder or lotion to the site. °· Do not submerge the affected site in water for 3 to 5 days. °· Inspect the site at least twice daily. °· Do not flex or bend the affected arm for 24 hours. °· No lifting over 5 pounds (2.3 kg) for 5 days after your procedure. °· Do not drive home if you are discharged the same day of the procedure. Have someone else drive you. °· You may drive 24 hours after the procedure unless otherwise instructed by your caregiver. °· Do not operate machinery or power tools for 24 hours. °· A responsible adult should be with you for the first 24 hours after you arrive home. °What to expect: °· Any bruising will usually fade within 1 to 2 weeks. °· Blood that collects in the tissue (hematoma) may be painful to the touch. It should usually decrease in size and tenderness within 1 to 2 weeks. °SEEK IMMEDIATE MEDICAL CARE IF: °· You have unusual pain at the radial site. °· You have redness, warmth, swelling, or pain at the radial site. °· You have drainage (other than a small amount of blood on the dressing). °· You have chills. °· You have a fever or persistent symptoms for more than 72 hours. °· You have a fever and your symptoms suddenly get worse. °· Your arm becomes pale, cool, tingly, or numb. °· You have heavy bleeding from the site. Hold pressure on the site. °Document Released: 01/17/2011 Document Revised:  03/08/2012 Document Reviewed: 01/17/2011 °Exit[employer]® Patient Information ©2015 [employer]. This information is not intended to replace advice given to you by your health care provider. Make sure you discuss any questions you have with your health care provider. ° °

## 2015-09-10 ENCOUNTER — Telehealth: Payer: Self-pay | Admitting: Cardiovascular Disease

## 2015-09-10 ENCOUNTER — Encounter (HOSPITAL_COMMUNITY): Payer: Self-pay | Admitting: Cardiology

## 2015-09-10 MED FILL — Heparin Sodium (Porcine) 2 Unit/ML in Sodium Chloride 0.9%: INTRAMUSCULAR | Qty: 500 | Status: AC

## 2015-09-10 NOTE — Telephone Encounter (Signed)
Patient complaining of swelling in legs, ankles, and feet. Patient denies any chest pain or SOB. Patient stated he wants to go back on his losartan potassium pills, to help with swelling. Patient is taking all his over medications. Encouraged patient to elevated BLE and to lower salt intake. Informed patient the a message would be sent to Dr. Acie Fredrickson for further advisement.

## 2015-09-10 NOTE — Telephone Encounter (Signed)
New message       Can pt go back on losartan potassium pill for swollen legs/ankle/feet?

## 2015-09-11 MED ORDER — LOSARTAN POTASSIUM 50 MG PO TABS: 50.0000 mg | ORAL_TABLET | Freq: Every day | ORAL | Status: DC

## 2015-09-11 NOTE — Telephone Encounter (Signed)
Spoke with patient and reviewed Dr. Elmarie Shiley advice that he may restart Losartan 50 mg daily.  He has an appointment with Dr. Acie Fredrickson on 9/27 and we will check bmet at that time.  He also requests to restart diclofenac for back pain.  Dr. Acie Fredrickson advised patient check with medical doctor to see if there is a different medication for him that is less taxing on his kidneys.  He states that this medication works really well for him and makes his life much more pleasant.  I advised to take as needed, preferably not daily until he sees his PCP.  I advised him to call back with questions or concerns.  He verbalized understanding and agreement.

## 2015-09-11 NOTE — Telephone Encounter (Signed)
The Losartan was held before cath . Now that cath is done, We can restart the Losartan 50 mg a day  Lets check a BMP in 2-3 weeks.

## 2015-09-20 ENCOUNTER — Telehealth: Payer: Self-pay | Admitting: Cardiovascular Disease

## 2015-09-20 NOTE — Telephone Encounter (Signed)
Pt  Was diagnosed with CHF  He has been managing his  LE swelling well until the last week, his ankles are more swollen and his calf feels tight. Today the edema is worse. Pt denies SOB also he thinks that  hid BP is fine, because he does not have a headache which it usually happens when his BP is up. For the last 3 days pt has been having right leg rash also. Dr. Acie Fredrickson MD aware of symptoms and  recommends for pt to take 40 mg of lasix in AM and 40 mg  in the PM instead of the 20 mg for 3 days. Pt  To elevate his legs up when sitting and when he goes to be at night. Pt to keep appointment with Dr. Acie Fredrickson on 09/25/15 a 8:30 AM. Pt and wife are aware of MD's recommendations.

## 2015-09-20 NOTE — Telephone Encounter (Signed)
Pt c/o swelling: STAT is pt has developed SOB within 24 hours  1. How long have you been experiencing swelling? week  2. Where is the swelling located?    Ankles and calves  3.  Are you currently taking a "fluid pill"?     Furosemide  4.  Are you currently SOB? no  5.  Have you traveled recently?   no  His SOB has gotten worse in the last 24 hours

## 2015-09-25 ENCOUNTER — Ambulatory Visit (INDEPENDENT_AMBULATORY_CARE_PROVIDER_SITE_OTHER): Payer: Medicare Other | Admitting: Cardiovascular Disease

## 2015-09-25 ENCOUNTER — Encounter: Payer: Self-pay | Admitting: Cardiovascular Disease

## 2015-09-25 VITALS — BP 120/70 | HR 57 | Ht 73.0 in | Wt 282.8 lb

## 2015-09-25 DIAGNOSIS — I10 Essential (primary) hypertension: Secondary | ICD-10-CM | POA: Diagnosis not present

## 2015-09-25 DIAGNOSIS — I5042 Chronic combined systolic (congestive) and diastolic (congestive) heart failure: Secondary | ICD-10-CM

## 2015-09-25 LAB — BASIC METABOLIC PANEL
BUN: 34 mg/dL — AB (ref 6–23)
CALCIUM: 9.3 mg/dL (ref 8.4–10.5)
CHLORIDE: 107 meq/L (ref 96–112)
CO2: 29 mEq/L (ref 19–32)
CREATININE: 1.3 mg/dL (ref 0.40–1.50)
GFR: 57.88 mL/min — ABNORMAL LOW (ref >60.00)
Glucose, Bld: 109 mg/dL — ABNORMAL HIGH (ref 70–99)
Potassium: 4.1 mEq/L (ref 3.5–5.1)
Sodium: 143 mEq/L (ref 135–145)

## 2015-09-25 MED ORDER — POTASSIUM CHLORIDE ER 10 MEQ PO TBCR: 10.0000 meq | EXTENDED_RELEASE_TABLET | Freq: Two times a day (BID) | ORAL | Status: DC

## 2015-09-25 MED ORDER — AMLODIPINE BESYLATE 10 MG PO TABS: 5.0000 mg | ORAL_TABLET | Freq: Every day | ORAL | Status: DC

## 2015-09-25 NOTE — Progress Notes (Signed)
Cardiology Office Note   Date:  09/25/2015   ID:  Travis, [name] October 21, 1946, MRN [id number]  PCP:  Simona Huh, MD  Cardiologist:   [name], MD   Chief Complaint  Patient presents with  . Follow-up   1. Hypertension 2. Hypothyroidism 3. DM 4. OSA 5. Mild pulmonary hypertensino 6. Chronic diastolic dysfunction   History of Present Illness: [name] is seen back today for a post hospital visit. He is the father - in -law of Travis [name] ([employer] nurse) and Travis [name] ([place]) I saw him in consultatoin on July 17, 2014 for issues related to his diastolic dysfunction in the setting of pneumonia / respiratory failure He was seen By Truitt Merle in late August for follow up of his hospitalization.   Travis [name] He is a 69 year old male with HTN, hypothyroidism, OSA, poorly controlled type 2 DM, former smoker and DJD.  Admitted back in July with CAP/UTI with associated sepsis. Was intubated due to progressive respiratory failure. Had AKI/ATN in the setting of PNA, hypoxic RF and septic shock. Felt to have some degree of diastolic HF - echo was not of good quality. Apparently has had past high sodium diet and uncontrolled HTN. Looks like he was on Lisinopril HCT, cardura and a lower dose of beta blocker. Mildly elevated troponin but no anginal symptoms and this was suspected to be from respiratory failure/CAP with septic shock.   Was seen by Cecille Rubin on August 28, Reported getting stronger, Breathing was ok. Leg edema has improved.   Sept. 9, 2015:  Travis [name] is seen today with his wife. Walking with a walker. Getting stronger every day.  Has cut out his high salt foods.  He i still working Administrator, [employer])  Has been seen by home health nurses - HR has been slow. No episodes of syncope.   Sept. 24, 2015:  Travis [name] is doing OK but continues to gradually gained strength. His home health nurse recently noted that his heart rate was very slow. Occasionally  have some episodes of very mild lightheadedness when he sat on the toilet type of bowel movement. He has not had episodes of syncope and seems to be doing pretty well at other times. He Has not had any syncope.   Dec. 11, 2015:  Travis [name] is here today for his HTN,  Has OSA - has been wearing his CPAP. , no CP and no dyspnea. Occasional eipisodes of orthostatic hypotension. Making progress with his rehab but he had to suspend it because of his elevated BP Does his best to avoid salt.  We reviewed his diet - no high salt items typically .  BP at the rehab is typically elevated but    March 09, 2015:   Travis [name] is a 69 y.o. male who presents for follow-up of his high blood pressure. He slowly getting stronger but still walks with the assistance of a walker. He has some left leg pain and he has had to re-start using his walker again - was using a cane.  Wears his CPAP at night.  BP yesterday was [id number] systolic .   He had some lightheadedness yesterday - glucose was ok.  BP was ok  August 17, 2015:  Doing well. Going to rehab -  BP looks great  No CP or dyspnea   Sept. 27, 2016: Doing well. Still has lots of issues with leg swelling . Elevates his legs at night - legs  are much better in the am.  Cath revealed mildly elevated right sided pressures. Has mild - moderate CAD  LV EF is 55-60%  We doubled his lasix to 40 BID for several days and felt much better .       Past Medical History  Diagnosis Date  . PONV (postoperative nausea and vomiting)   . Hypertension   . DJD (degenerative joint disease)     WHOLE SPINE  . Hyperthyroidism     Had Iodine to resolve issue DX 10 YR AGO  . Hypothyroidism     Low d/t treatment of hyperthyroidism  . Sleep apnea     DX  2009/2010 study done at [facility]  . Type II diabetes mellitus     diet controlled  . Heart murmur     Evaluated by cardiologist 4-5 years ago but does not currently see a cardiologist  . Gait disorder   .  Cervical myelopathy     with myelomalacia at C5-6 and C3-4  . RLS (restless legs syndrome)   . Peripheral neuropathy   . 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 Constipation - functional     controlled with miralax  . RBBB   . 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 CHF (congestive heart failure)   . Pneumonia   . Restless legs syndrome 03/14/2015  . Renal disorder     acute kidney failure    Past Surgical History  Procedure Laterality Date  . Cholecystectomy    . Back surgery      6 surgeries  . Lumbar laminectomy/decompression microdiscectomy  07/14/2012    Procedure: LUMBAR LAMINECTOMY/DECOMPRESSION MICRODISCECTOMY 2 LEVELS;  Surgeon: Eustace Moore, MD;  Location: [facility];  Service: Neurosurgery;  Laterality: Left;  Lumbar two three laminectomy, left thoracic four five transpedicular diskectomy  . Cardiac catheterization [name] 09/07/2015    Procedure: Right/Left Heart Cath and Coronary Angiography;  Surgeon: Peter M Martinique, MD;  Location: [facility];  Service: Cardiovascular;  Laterality: [name];     Current Outpatient Prescriptions  Medication Sig Dispense Refill  . amLODipine (NORVASC) 10 MG tablet Take 1 tablet (10 mg total) by mouth daily. 30 tablet 0  . aspirin EC 81 MG EC tablet Take 1 tablet (81 mg total) by mouth daily. 30 tablet 0  . baclofen (LIORESAL) 10 MG tablet Take 1 tablet (10 mg total) by mouth 3 (three) times daily. For muscle spasms 90 each 1  . diclofenac (VOLTAREN) 75 MG EC tablet Take 75 mg by mouth daily.  2  . escitalopram (LEXAPRO) 10 MG tablet Take 10 mg by mouth daily.  2  . furosemide (LASIX) 40 MG tablet Take 40 mg by mouth 2 (two) times daily.     Travis [name] gabapentin (NEURONTIN) 400 MG capsule TAKE 1 CAPSULE (400 MG TOTAL) BY MOUTH EVERY 8 (EIGHT) HOURS. 90 capsule 0  . glimepiride (AMARYL) 2 MG tablet Take 2 mg by mouth daily with breakfast.    . hydrALAZINE (APRESOLINE) 25 MG tablet Take 1 tablet (25 mg total) by mouth every 8 (eight) hours. 90  tablet 0  . KLOR-CON 10 10 MEQ tablet TAKE 1 TABLET (10 MEQ TOTAL) BY MOUTH DAILY. 30 tablet 6  . levothyroxine (SYNTHROID, LEVOTHROID) 175 MCG tablet Take 175 mcg by mouth daily.  2  . losartan (COZAAR) 50 MG tablet Take 1 tablet (50 mg total) by mouth daily. 90 tablet 3  . polyethylene glycol (MIRALAX / GLYCOLAX) packet Take  17 g by mouth daily as needed. For constipation 14 each 0  . pramipexole (MIRAPEX) 0.5 MG tablet Take 0.5 mg by mouth 2 (two) times daily.     . tamsulosin (FLOMAX) 0.4 MG CAPS capsule Take 0.4 mg by mouth daily.     . TOVIAZ 8 MG TB24 tablet Take 8 mg by mouth daily.  11   No current facility-administered medications for this visit.    Allergies:   Other    Social History:  The patient  reports that he quit smoking about 30 years ago. His smoking use included Cigarettes. He has a 40 pack-year smoking history. He quit smokeless tobacco use about 30 years ago. He reports that he does not drink alcohol or use illicit drugs.   Family History:  The patient's family history includes CAD in an other family member; Diabetes in his father; Heart disease in his father; Hypertension in his father; Polycythemia in his mother.    ROS:  Please see the history of present illness.    Review of Systems: Constitutional:  denies fever, chills, diaphoresis, appetite change and fatigue.  HEENT: denies photophobia, eye pain, redness, hearing loss, ear pain, congestion, sore throat, rhinorrhea, sneezing, neck pain, neck stiffness and tinnitus.  Respiratory: admits to SOB, DOE, cough, chest tightness, and wheezing.  Cardiovascular: admits to   leg swelling.  Gastrointestinal: denies nausea, vomiting, abdominal pain, diarrhea, constipation, blood in stool.  Genitourinary: denies dysuria, urgency, frequency, hematuria, flank pain and difficulty urinating.  Musculoskeletal: denies  myalgias, back pain, joint swelling, arthralgias and gait problem.   Skin: denies pallor, rash and wound.    Neurological: denies dizziness, seizures, syncope, weakness, light-headedness, numbness and headaches.   Hematological: denies adenopathy, easy bruising, personal or family bleeding history.  Psychiatric/ Behavioral: denies suicidal ideation, mood changes, confusion, nervousness, sleep disturbance and agitation.       All other systems are reviewed and negative.    PHYSICAL EXAM: VS:  BP 120/70 mmHg  Pulse 57  Ht 6\' 1"  (1.854 m)  Wt 128.277 kg (282 lb 12.8 oz)  BMI 37.32 kg/m2  SpO2 91% , BMI Body mass index is 37.32 kg/(m^2). GEN: Well nourished, well developed, in no acute distress HEENT: normal Neck: no JVD, carotid bruits, or masses Cardiac: RRR; no murmurs, rubs, or gallops, trace bilateral edema  Respiratory:  clear to auscultation bilaterally, normal work of breathing GI: soft, nontender, nondistended, + BS MS: no deformity or atrophy Skin: warm and dry, no rash Neuro:  Strength and sensation are intact.  He was walking with the assistance of a walker. Psych: normal   EKG:  EKG is ordered today. The ekg ordered today demonstrates normal sinus rhythm at 67. He has occasional premature ventricular contractions. He has right bundle branch block.   Recent Labs: 08/19/2015: ALT 36; B Natriuretic Peptide 94.3 08/20/2015: TSH 0.299* 08/23/2015: Magnesium 2.2 08/27/2015: Hemoglobin 12.7*; Platelets 245.0 09/04/2015: BUN 31*; Creatinine, Ser 1.13; Potassium 4.3; Sodium 140    Lipid Panel    Component Value Date/Time   CHOL 124 08/20/2015 1458   TRIG 82 08/20/2015 1458   HDL 29* 08/20/2015 1458   CHOLHDL 4.3 08/20/2015 1458   VLDL 16 08/20/2015 1458   LDLCALC 79 08/20/2015 1458      Wt Readings from Last 3 Encounters:  09/25/15 128.277 kg (282 lb 12.8 oz)  09/07/15 126.1 kg (278 lb)  08/27/15 126.1 kg (278 lb)      Other studies Reviewed: Additional studies/ records that were reviewed  today include: . Review of the above records demonstrates:    ASSESSMENT  AND PLAN:  1. Hypertension- he's watching his salt. We added losartan several weeks ago. His blood pressure is now well controlled.  We will check a basic medical profile today. Because he is having significant leg edema, I would like to reduce the amlodipine to 5 mg a day.  We will also increase his Lasix to 40 mg twice a day. We will increase the potassium to 10 mEq twice a day.  2. Hypothyroidism 3. DM- glucose levels have been well-controlled. 4. OSA 5. Mild combined systolic and diastolic CHF:  His history of mild systolic congestive heart failure. He now has some diastolic dysfunction.  He's doing quite a bit better on salt restriction and the increased dose of Lasix. His heart rate is slow. We will not be able to add a beta blocker. 6. Generalized weakness:  He's had generalized weakness for years.  I've asked to discuss this with his general medical doctor.  Current medicines are reviewed at length with the patient today.  The patient does not have concerns regarding medicines.  The following changes have been made:  Increasing Lasix to 40 mg twice a day. Increasing potassium to 10 mEq twice a day.  Labs/ tests ordered today include:   No orders of the defined types were placed in this encounter.     Disposition:   FU with me in 3 months   Signed, Nahser, Wonda Cheng, MD  09/25/2015 8:39 AM    [facility] [address], [place], [place]  [address] Phone: [phone]; Fax: [phone]

## 2015-09-25 NOTE — Patient Instructions (Signed)
Medication Instructions:  DECREASE Amlodipine to 5 mg once daily INCREASE Lasix to 40 mg twice daily  INCREASE Kdur to 10 meq twice daily   Labwork: TODAY - basic metabolic panel  Your physician recommends that you return for lab work in: 3 weeks for basic metabolic panel   Testing/Procedures: None Ordered   Follow-Up: Your physician recommends that you schedule a follow-up appointment in: 3 months with Dr. Acie Fredrickson.

## 2015-10-08 ENCOUNTER — Other Ambulatory Visit: Payer: Self-pay | Admitting: Cardiovascular Disease

## 2015-10-16 ENCOUNTER — Other Ambulatory Visit: Payer: Medicare Other

## 2015-10-17 ENCOUNTER — Other Ambulatory Visit (INDEPENDENT_AMBULATORY_CARE_PROVIDER_SITE_OTHER): Payer: Medicare Other

## 2015-10-17 DIAGNOSIS — I10 Essential (primary) hypertension: Secondary | ICD-10-CM

## 2015-11-07 ENCOUNTER — Telehealth: Payer: Self-pay | Admitting: Nurse Practitioner

## 2015-11-07 NOTE — Telephone Encounter (Signed)
Received call from patient who states his leg edema is improved.  He states he has been elevating them more frequently at night and has noticed improvement.  He denies SOB or any other concerns at this time.  He states Dr. Marisue Humble did lab work and was supposed to send copies to our office.  I called Dr. Andrew Au office to request copies of lab be faxed to [place].

## 2015-11-07 NOTE — Telephone Encounter (Signed)
Left messages home and cell for patient to call back regarding bmet that was ordered for 10/19 that was not done.  The patient's medications were adjusted during 9/26 ov with Dr. Acie Fredrickson and patient was scheduled for repeat lab work 3 weeks after that appointment.  Would also like to know if patient's leg edema is improved.

## 2015-11-08 ENCOUNTER — Encounter: Payer: Self-pay | Admitting: Cardiovascular Disease

## 2015-11-08 NOTE — Telephone Encounter (Signed)
Lab work received and scanned into patient's chart

## 2015-12-03 ENCOUNTER — Encounter: Payer: Self-pay | Admitting: Cardiovascular Disease

## 2015-12-03 ENCOUNTER — Ambulatory Visit (INDEPENDENT_AMBULATORY_CARE_PROVIDER_SITE_OTHER): Payer: Medicare Other | Admitting: Cardiovascular Disease

## 2015-12-03 VITALS — BP 130/70 | HR 56 | Ht 73.0 in | Wt 288.2 lb

## 2015-12-03 DIAGNOSIS — I5042 Chronic combined systolic (congestive) and diastolic (congestive) heart failure: Secondary | ICD-10-CM

## 2015-12-03 DIAGNOSIS — Z13220 Encounter for screening for lipoid disorders: Secondary | ICD-10-CM | POA: Diagnosis not present

## 2015-12-03 LAB — BASIC METABOLIC PANEL
BUN: 34 mg/dL — ABNORMAL HIGH (ref 7–25)
CHLORIDE: 108 mmol/L (ref 98–110)
CO2: 28 mmol/L (ref 20–31)
Calcium: 9.6 mg/dL (ref 8.6–10.3)
Creat: 1.37 mg/dL — ABNORMAL HIGH (ref 0.70–1.18)
GLUCOSE: 109 mg/dL — AB (ref 65–99)
POTASSIUM: 4.3 mmol/L (ref 3.5–5.3)
SODIUM: 146 mmol/L (ref 135–146)

## 2015-12-03 MED ORDER — AMLODIPINE BESYLATE 5 MG PO TABS: 5.0000 mg | ORAL_TABLET | Freq: Every day | ORAL | Status: DC

## 2015-12-03 NOTE — Patient Instructions (Signed)
Medication Instructions:  Your physician recommends that you continue on your current medications as directed. Please refer to the Current Medication list given to you today.   Labwork: TODAY - basic metabolic panel  Your physician recommends that you return for lab work in: 6 months on the day of or a few days before your office visit with Dr. Nahser.  You will need to FAST for this appointment - nothing to eat or drink after midnight the night before except water.   Testing/Procedures: None Ordered   Follow-Up: Your physician wants you to follow-up in: 6 months with Dr. Nahser.  You will receive a reminder letter in the mail two months in advance. If you don't receive a letter, please call our office to schedule the follow-up appointment.   If you need a refill on your cardiac medications before your next appointment, please call your pharmacy.   Thank you for choosing [facility]! Shaunessy Dobratz, RN [phone]    

## 2015-12-03 NOTE — Progress Notes (Signed)
Cardiology Office Note   Date:  12/03/2015   ID:  Travis [name], Travis [name] 12-31-1945, MRN [id number]  PCP:  Simona Huh, MD  Cardiologist:   [name], MD   Chief Complaint  Patient presents with  . Follow-up    CHF   1. Hypertension 2. Hypothyroidism 3. DM 4. OSA 5. Mild pulmonary hypertensino 6. Chronic diastolic dysfunction   History of Present Illness: Travis [name] is seen back today for a post hospital visit. He is the father - in -law of [name] and [name] (Heard) I saw him in consultatoin on July 17, 2014 for issues related to his diastolic dysfunction in the setting of pneumonia / respiratory failure He was seen By Truitt Merle in late August for follow up of his hospitalization.   [name] He is a 69 year old male with HTN, hypothyroidism, OSA, poorly controlled type 2 DM, former smoker and DJD.  Admitted back in July with CAP/UTI with associated sepsis. Was intubated due to progressive respiratory failure. Had AKI/ATN in the setting of PNA, hypoxic RF and septic shock. Felt to have some degree of diastolic HF - echo was not of good quality. Apparently has had past high sodium diet and uncontrolled HTN. Looks like he was on Lisinopril HCT, cardura and a lower dose of beta blocker. Mildly elevated troponin but no anginal symptoms and this was suspected to be from respiratory failure/CAP with septic shock.   Was seen by Cecille Rubin on August 28, Reported getting stronger, Breathing was ok. Leg edema has improved.   Sept. 9, 2015:  Travis [name] is seen today with his wife. Walking with a walker. Getting stronger every day.  Has cut out his high salt foods.  He i still working Administrator, sports estate)  Has been seen by home health nurses - HR has been slow. No episodes of syncope.   Sept. 24, 2015:  Travis [name] is doing OK but continues to gradually gained strength. His home health nurse recently noted that his heart rate was very slow.  Occasionally have some episodes of very mild lightheadedness when he sat on the toilet type of bowel movement. He has not had episodes of syncope and seems to be doing pretty well at other times. He Has not had any syncope.   Dec. 11, 2015:  Travis [name] is here today for his HTN,  Has OSA - has been wearing his CPAP. , no CP and no dyspnea. Occasional eipisodes of orthostatic hypotension. Making progress with his rehab but he had to suspend it because of his elevated BP Does his best to avoid salt.  We reviewed his diet - no high salt items typically .  BP at the rehab is typically elevated but    March 09, 2015:   Travis [name] is a 69 y.o. male who presents for follow-up of his high blood pressure. He slowly getting stronger but still walks with the assistance of a walker. He has some left leg pain and he has had to re-start using his walker again - was using a cane.  Wears his CPAP at night.  BP yesterday was [id number] systolic .   He had some lightheadedness yesterday - glucose was ok.  BP was ok  August 17, 2015:  Doing well. Going to rehab -  BP looks great  No CP or dyspnea   Sept. 27, 2016: Doing well. Still has lots of issues with leg swelling . Elevates his legs  at night - legs are much better in the am.  Cath revealed mildly elevated right sided pressures. Has mild - moderate CAD  LV EF is 55-60%  We doubled his lasix to 40 BID for several days and felt much better .    Dec. 5, 2016: Travis [name] is seen today for follow up of his CHF. Seems to be getting stronger. Able to get around with a cane - which is an improvement  Doing some calf exercises which has helped the leg swelling .     Past Medical History  Diagnosis Date  . PONV (postoperative nausea and vomiting)   . Hypertension   . DJD (degenerative joint disease)     WHOLE SPINE  . Hyperthyroidism     Had Iodine to resolve issue DX 10 YR AGO  . Hypothyroidism     Low d/t treatment of hyperthyroidism  .  Sleep apnea     DX  2009/2010 [facility] done at [facility]  . Type II diabetes mellitus (HCC)     diet controlled  . Heart murmur     Evaluated by cardiologist 4-5 years ago but does not currently see a cardiologist  . Gait disorder   . Cervical myelopathy (HCC)     with myelomalacia at C5-6 and C3-4  . RLS (restless legs syndrome)   . Peripheral neuropathy ([place])   . 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 Constipation - functional     controlled with miralax  . RBBB   . 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 CHF (congestive heart failure) ([place])   . Pneumonia   . Restless legs syndrome 03/14/2015  . Renal disorder     acute kidney failure    Past Surgical History  Procedure Laterality Date  . Cholecystectomy    . Back surgery      6 surgeries  . Lumbar laminectomy/decompression microdiscectomy  07/14/2012    Procedure: LUMBAR LAMINECTOMY/DECOMPRESSION MICRODISCECTOMY 2 LEVELS;  Surgeon: Eustace Moore, MD;  Location: [place] [facility];  Service: Neurosurgery;  Laterality: Left;  Lumbar two three laminectomy, left thoracic four five transpedicular diskectomy  . Cardiac catheterization [name] 09/07/2015    Procedure: Right/Left Heart Cath and Coronary Angiography;  Surgeon: Peter M Martinique, MD;  Location: [place] [facility];  Service: Cardiovascular;  Laterality: [name];     Current Outpatient Prescriptions  Medication Sig Dispense Refill  . amLODipine (NORVASC) 10 MG tablet Take 0.5 tablets (5 mg total) by mouth daily. 45 tablet 3  . aspirin EC 81 MG EC tablet Take 1 tablet (81 mg total) by mouth daily. 30 tablet 0  . baclofen (LIORESAL) 10 MG tablet Take 1 tablet (10 mg total) by mouth 3 (three) times daily. For muscle spasms 90 each 1  . diclofenac (VOLTAREN) 75 MG EC tablet Take 75 mg by mouth daily.  2  . escitalopram (LEXAPRO) 10 MG tablet Take 10 mg by mouth daily.  2  . furosemide (LASIX) 40 MG tablet Take 40 mg by mouth 2 (two) times daily.     [name] gabapentin (NEURONTIN) 400 MG capsule TAKE  1 CAPSULE (400 MG TOTAL) BY MOUTH EVERY 8 (EIGHT) HOURS. 90 capsule 0  . glimepiride (AMARYL) 2 MG tablet Take 2 mg by mouth daily with breakfast.    . hydrALAZINE (APRESOLINE) 25 MG tablet Take 1 tablet (25 mg total) by mouth every 8 (eight) hours. 90 tablet 0  . levothyroxine (SYNTHROID, LEVOTHROID) 175 MCG tablet Take 175 mcg by  mouth daily.  2  . losartan (COZAAR) 50 MG tablet Take 1 tablet (50 mg total) by mouth daily. 90 tablet 3  . polyethylene glycol (MIRALAX / GLYCOLAX) packet Take 17 g by mouth daily as needed. For constipation 14 each 0  . potassium chloride (KLOR-CON 10) 10 MEQ tablet Take 1 tablet (10 mEq total) by mouth 2 (two) times daily. 180 tablet 3  . pramipexole (MIRAPEX) 0.5 MG tablet Take 0.5 mg by mouth 2 (two) times daily.     . tamsulosin (FLOMAX) 0.4 MG CAPS capsule Take 0.4 mg by mouth daily.     . TOVIAZ 8 MG TB24 tablet Take 8 mg by mouth daily.  11   No current facility-administered medications for this visit.    Allergies:   Other    Social History:  The patient  reports that he quit smoking about 30 years ago. His smoking use included Cigarettes. He has a 40 pack-year smoking history. He quit smokeless tobacco use about 30 years ago. He reports that he does not drink alcohol or use illicit drugs.   Family History:  The patient's family history includes CAD in an other family member; Diabetes in his father; Heart disease in his father; Hypertension in his father; Polycythemia in his mother.    ROS:  Please see the history of present illness.    Review of Systems: Constitutional:  denies fever, chills, diaphoresis, appetite change and fatigue.  HEENT: denies photophobia, eye pain, redness, hearing loss, ear pain, congestion, sore throat, rhinorrhea, sneezing, neck pain, neck stiffness and tinnitus.  Respiratory: admits to SOB, DOE, cough, chest tightness, and wheezing.  Cardiovascular: admits to   leg swelling.  Gastrointestinal: denies nausea, vomiting,  abdominal pain, diarrhea, constipation, blood in stool.  Genitourinary: denies dysuria, urgency, frequency, hematuria, flank pain and difficulty urinating.  Musculoskeletal: denies  myalgias, back pain, joint swelling, arthralgias and gait problem.   Skin: denies pallor, rash and wound.  Neurological: denies dizziness, seizures, syncope, weakness, light-headedness, numbness and headaches.   Hematological: denies adenopathy, easy bruising, personal or family bleeding history.  Psychiatric/ Behavioral: denies suicidal ideation, mood changes, confusion, nervousness, sleep disturbance and agitation.       All other systems are reviewed and negative.    PHYSICAL EXAM: VS:  BP 130/70 mmHg  Pulse 56  Ht 6\' 1"  (1.854 m)  Wt 288 lb 3.2 oz (130.727 kg)  BMI 38.03 kg/m2  SpO2 90% , BMI Body mass index is 38.03 kg/(m^2). GEN: Well nourished, well developed, in no acute distress HEENT: normal Neck: no JVD, carotid bruits, or masses Cardiac: RRR; no murmurs, rubs, or gallops, trace bilateral edema  Respiratory:  clear to auscultation bilaterally, normal work of breathing GI: soft, nontender, nondistended, + BS MS: no deformity or atrophy Skin: warm and dry, no rash Neuro:  Strength and sensation are intact.  He was walking with the assistance of a walker. Psych: normal   EKG:  EKG is ordered today. The ekg ordered today demonstrates normal sinus rhythm at 67. He has occasional premature ventricular contractions. He has right bundle branch block.   Recent Labs: 08/19/2015: ALT 36; B Natriuretic Peptide 94.3 08/20/2015: TSH 0.299* 08/23/2015: Magnesium 2.2 08/27/2015: Hemoglobin 12.7*; Platelets 245.0 09/25/2015: BUN 34*; Creatinine, Ser 1.30; Potassium 4.1; Sodium 143    Lipid Panel    Component Value Date/Time   CHOL 124 08/20/2015 1458   TRIG 82 08/20/2015 1458   HDL 29* 08/20/2015 1458   CHOLHDL 4.3 08/20/2015 1458  VLDL 16 08/20/2015 1458   LDLCALC 79 08/20/2015 1458      Wt  Readings from Last 3 Encounters:  12/03/15 288 lb 3.2 oz (130.727 kg)  09/25/15 282 lb 12.8 oz (128.277 kg)  09/07/15 278 lb (126.1 kg)      Other studies Reviewed: Additional studies/ records that were reviewed today include: . Review of the above records demonstrates:    ASSESSMENT AND PLAN:  1. Hypertension- BP is doing well controlled   2. Hypothyroidism 3. DM- glucose levels have been well-controlled. 4. OSA 5. Mild combined systolic and diastolic CHF:  His history of mild systolic congestive heart failure. He now has some diastolic dysfunction.  He's doing quite a bit better on salt restriction and the increased dose of Lasix.  6. Generalized weakness:  He's had generalized weakness for years.  I've asked to discuss this with his general medical doctor.  Current medicines are reviewed at length with the patient today.  The patient does not have concerns regarding medicines.  The following changes have been made:  Increasing Lasix to 40 mg twice a day. Increasing potassium to 10 mEq twice a day.  Labs/ tests ordered today include:   No orders of the defined types were placed in this encounter.     Disposition:   FU with me in 3 months   Signed, Nahser, Wonda Cheng, MD  12/03/2015 9:11 AM    [facility] [place], [place], [place]  [address] Phone: [phone]; Fax: [phone]

## 2015-12-07 ENCOUNTER — Telehealth: Payer: Self-pay | Admitting: Cardiovascular Disease

## 2015-12-07 NOTE — Telephone Encounter (Signed)
Lab results reviewed with patient who verbalized understanding 

## 2015-12-07 NOTE — Telephone Encounter (Signed)
Follow Up ° °Pt returned call//  °

## 2015-12-14 ENCOUNTER — Ambulatory Visit (INDEPENDENT_AMBULATORY_CARE_PROVIDER_SITE_OTHER): Payer: Medicare Other | Admitting: Neurology

## 2015-12-14 ENCOUNTER — Encounter: Payer: Self-pay | Admitting: Neurology

## 2015-12-14 VITALS — BP 125/58 | HR 56 | Ht 73.0 in | Wt 286.0 lb

## 2015-12-14 DIAGNOSIS — M4714 Other spondylosis with myelopathy, thoracic region: Secondary | ICD-10-CM

## 2015-12-14 DIAGNOSIS — R269 Unspecified abnormalities of gait and mobility: Secondary | ICD-10-CM

## 2015-12-14 DIAGNOSIS — M4712 Other spondylosis with myelopathy, cervical region: Secondary | ICD-10-CM | POA: Diagnosis not present

## 2015-12-14 DIAGNOSIS — M4807 Spinal stenosis, lumbosacral region: Secondary | ICD-10-CM

## 2015-12-14 NOTE — Progress Notes (Signed)
Reason for visit: Cervical myelopathy  Travis [name] is an 69 y.o. male  History of present illness:  Travis [name] is a 69 year old right-handed white male with a history of a cervical myelopathy with a chronic gait disorder. The patient has been undergoing physical therapy until October 2016. He is now exercising at home, he walks up and down his driveway 3 to 4 times a day, he uses a stationary bike. He is careful with ambulation, he has not had any falls. The patient does have some discomfort between the shoulder blades on occasion, but overall his pain level has improved. He is on gabapentin taking 400 mg 3 times daily. He is sleeping fairly well at nighttime. He denies any new medical issues that have come up since last seen. There has been no alteration in muscle strength. He indicates that the bowels and bladder are working fairly well.  Past Medical History  Diagnosis Date  . PONV (postoperative nausea and vomiting)   . Hypertension   . DJD (degenerative joint disease)     WHOLE SPINE  . Hyperthyroidism     Had Iodine to resolve issue DX 10 YR AGO  . Hypothyroidism     Low d/t treatment of hyperthyroidism  . Sleep apnea     DX  2009/2010 study done at [facility]  . Type II diabetes mellitus (HCC)     diet controlled  . Heart murmur     Evaluated by cardiologist 4-5 years ago but does not currently see a cardiologist  . Gait disorder   . Cervical myelopathy (HCC)     with myelomalacia at C5-6 and C3-4  . RLS (restless legs syndrome)   . Peripheral neuropathy ([place])   . 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 Constipation - functional     controlled with miralax  . RBBB   . 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 CHF (congestive heart failure) ([place])   . Pneumonia   . Restless legs syndrome 03/14/2015  . Renal disorder     acute kidney failure    Past Surgical History  Procedure Laterality Date  . Cholecystectomy    . Back surgery      6 surgeries  . Lumbar  laminectomy/decompression microdiscectomy  07/14/2012    Procedure: LUMBAR LAMINECTOMY/DECOMPRESSION MICRODISCECTOMY 2 LEVELS;  Surgeon: Eustace Moore, MD;  Location: [place] [facility];  Service: Neurosurgery;  Laterality: Left;  Lumbar two three laminectomy, left thoracic four five transpedicular diskectomy  . Cardiac catheterization [name] 09/07/2015    Procedure: Right/Left Heart Cath and Coronary Angiography;  Surgeon: Peter M Martinique, MD;  Location: [place] [facility];  Service: Cardiovascular;  Laterality: [name];    Family History  Problem Relation Age of Onset  . Polycythemia Mother   . Diabetes Father   . Hypertension Father   . Heart disease Father   . CAD      1 of his 4 brothers and father has CAD    Social history:  reports that he quit smoking about 30 years ago. His smoking use included Cigarettes. He has a 40 pack-year smoking history. He quit smokeless tobacco use about 30 years ago. He reports that he does not drink alcohol or use illicit drugs.    Allergies  Allergen Reactions  . Other Other (See Comments)    Anesthesia makes sick, must have antinausea medication in advance.    Medications:  Prior to Admission medications   Medication Sig Start Date  End Date Taking? Authorizing Provider  amLODipine (NORVASC) 5 MG tablet Take 1 tablet (5 mg total) by mouth daily. 12/03/15  Yes [name], MD  aspirin EC 81 MG EC tablet Take 1 tablet (81 mg total) by mouth daily. 08/23/15  Yes Thurnell Lose, MD  baclofen (LIORESAL) 10 MG tablet Take 1 tablet (10 mg total) by mouth 3 (three) times daily. For muscle spasms 10/30/14  Yes Meredith Staggers, MD  diclofenac (VOLTAREN) 75 MG EC tablet Take 75 mg by mouth daily. 07/14/15  Yes Historical Provider, MD  escitalopram (LEXAPRO) 10 MG tablet Take 10 mg by mouth daily. 07/12/15  Yes Historical Provider, MD  furosemide (LASIX) 40 MG tablet Take 40 mg by mouth 2 (two) times daily.    Yes Historical Provider, MD  gabapentin (NEURONTIN) 400 MG  capsule TAKE 1 CAPSULE (400 MG TOTAL) BY MOUTH EVERY 8 (EIGHT) HOURS. 04/16/15  Yes Meredith Staggers, MD  glimepiride (AMARYL) 2 MG tablet Take 2 mg by mouth daily with breakfast.   Yes Historical Provider, MD  hydrALAZINE (APRESOLINE) 25 MG tablet Take 1 tablet (25 mg total) by mouth every 8 (eight) hours. 08/23/15  Yes Thurnell Lose, MD  levothyroxine (SYNTHROID, LEVOTHROID) 175 MCG tablet Take 175 mcg by mouth daily. 08/12/15  Yes Historical Provider, MD  losartan (COZAAR) 50 MG tablet Take 1 tablet (50 mg total) by mouth daily. 09/11/15  Yes [name], MD  polyethylene glycol [facility] / Floria Raveling) packet Take 17 g by mouth daily as needed. For constipation 08/09/14  Yes Ivan Anchors Love, [name]  potassium chloride (KLOR-CON 10) 10 MEQ tablet Take 1 tablet (10 mEq total) by mouth 2 (two) times daily. 09/25/15  Yes [name], MD  pramipexole (MIRAPEX) 0.5 MG tablet Take 0.5 mg by mouth 2 (two) times daily.  08/09/14  Yes Ivan Anchors Love, [name]  tamsulosin (FLOMAX) 0.4 MG CAPS capsule Take 0.4 mg by mouth daily.  09/13/14  Yes Historical Provider, MD  TOVIAZ 8 MG TB24 tablet Take 8 mg by mouth daily. 08/07/15  Yes Historical Provider, MD    ROS:  Out of a complete 14 system review of symptoms, the patient complains only of the following symptoms, and all other reviewed systems are negative.  Gait disorder Neck discomfort  Blood pressure 125/58, pulse 56, height 6\' 1"  (1.854 m), weight 286 lb (129.729 kg).  Physical Exam  General: The patient is alert and cooperative at the time of the examination. The patient is moderately to markedly obese.  Skin: 2-3+ edema below the knees is noted bilaterally..   Neurologic Exam  Mental status: The patient is alert and oriented x 3 at the time of the examination. The patient has apparent normal recent and remote memory, with an apparently normal attention span and concentration ability.   Cranial nerves: Facial symmetry is present. Speech is normal,  no aphasia or dysarthria is noted. Extraocular movements are full. Visual fields are full.  Motor: The patient has good strength in all 4 extremities, with exception of some slight weakness of the intrinsic muscles of the hands bilaterally with some atrophy in the hands seen. There are mild bilateral foot drops.  Sensory examination: Soft touch sensation is symmetric on the face, arms, and legs.  Coordination: The patient has good finger-nose-finger and heel-to-shin bilaterally, but patient does have some difficulty performing heel-to-shin bilaterally.  Gait and station: The patient has a slightly wide-based gait, walking is slow and deliberate, the patient uses  a walker for ambulation. Tandem gait was not attempted. Romberg is unsteady, the patient does not fall.  Reflexes: Deep tendon reflexes are symmetric, but are depressed.   Assessment/Plan:  1. Cervical myelopathy  2. Gait disorder  3. Chronic pain syndrome  The patient is overall doing fairly well. He has done a good job Nurse, learning disability, he has engaged in a regular exercise program. He is on gabapentin on a regular basis for discomfort, so far this seems to be doing well for him. He will continue his ongoing exercise regimen, follow-up in one year, sooner if needed. He has not had any recurrence of back pain or leg discomfort.  Jill Alexanders MD 12/14/2015 3:54 PM  [facility] [address] [place], [place] [address]  Phone [phone] Fax [phone]

## 2015-12-14 NOTE — Patient Instructions (Signed)
Fall Prevention in the Home  Falls can cause injuries and can affect people from all age groups. There are many simple things that you can do to make your home safe and to help prevent falls. WHAT CAN I DO ON THE OUTSIDE OF MY HOME?  Regularly repair the edges of walkways and driveways and fix any cracks.  Remove high doorway thresholds.  Trim any shrubbery on the main path into your home.  Use bright outdoor lighting.  Clear walkways of debris and clutter, including tools and rocks.  Regularly check that handrails are securely fastened and in good repair. Both sides of any steps should have handrails.  Install guardrails along the edges of any raised decks or porches.  Have leaves, snow, and ice cleared regularly.  Use sand or salt on walkways during winter months.  In the garage, clean up any spills right away, including grease or oil spills. WHAT CAN I DO IN THE BATHROOM?  Use night lights.  Install grab bars by the toilet and in the tub and shower. Do not use towel bars as grab bars.  Use non-skid mats or decals on the floor of the tub or shower.  If you need to sit down while you are in the shower, use a plastic, non-slip stool..  Keep the floor dry. Immediately clean up any water that spills on the floor.  Remove soap buildup in the tub or shower on a regular basis.  Attach bath mats securely with double-sided non-slip rug tape.  Remove throw rugs and other tripping hazards from the floor. WHAT CAN I DO IN THE BEDROOM?  Use night lights.  Make sure that a bedside light is easy to reach.  Do not use oversized bedding that drapes onto the floor.  Have a firm chair that has side arms to use for getting dressed.  Remove throw rugs and other tripping hazards from the floor. WHAT CAN I DO IN THE KITCHEN?   Clean up any spills right away.  Avoid walking on wet floors.  Place frequently used items in easy-to-reach places.  If you need to reach for something  above you, use a sturdy step stool that has a grab bar.  Keep electrical cables out of the way.  Do not use floor polish or wax that makes floors slippery. If you have to use wax, make sure that it is non-skid floor wax.  Remove throw rugs and other tripping hazards from the floor. WHAT CAN I DO IN THE STAIRWAYS?  Do not leave any items on the stairs.  Make sure that there are handrails on both sides of the stairs. Fix handrails that are broken or loose. Make sure that handrails are as long as the stairways.  Check any carpeting to make sure that it is firmly attached to the stairs. Fix any carpet that is loose or worn.  Avoid having throw rugs at the top or bottom of stairways, or secure the rugs with carpet tape to prevent them from moving.  Make sure that you have a light switch at the top of the stairs and the bottom of the stairs. If you do not have them, have them installed. WHAT ARE SOME OTHER FALL PREVENTION TIPS?  Wear closed-toe shoes that fit well and support your feet. Wear shoes that have rubber soles or low heels.  When you use a stepladder, make sure that it is completely opened and that the sides are firmly locked. Have someone hold the ladder while you   are using it. Do not climb a closed stepladder.  Add color or contrast paint or tape to grab bars and handrails in your home. Place contrasting color strips on the first and last steps.  Use mobility aids as needed, such as canes, walkers, scooters, and crutches.  Turn on lights if it is dark. Replace any light bulbs that burn out.  Set up furniture so that there are clear paths. Keep the furniture in the same spot.  Fix any uneven floor surfaces.  Choose a carpet design that does not hide the edge of steps of a stairway.  Be aware of any and all pets.  Review your medicines with your healthcare provider. Some medicines can cause dizziness or changes in blood pressure, which increase your risk of falling. Talk  with your health care provider about other ways that you can decrease your risk of falls. This may include working with a physical therapist or trainer to improve your strength, balance, and endurance.   This information is not intended to replace advice given to you by your health care provider. Make sure you discuss any questions you have with your health care provider.   Document Released: 12/05/2002 Document Revised: 05/01/2015 Document Reviewed: 01/19/2015 Elsevier Interactive Patient Education 2016 [employer]  

## 2016-02-27 ENCOUNTER — Telehealth: Payer: Self-pay | Admitting: Cardiovascular Disease

## 2016-02-27 DIAGNOSIS — I5042 Chronic combined systolic (congestive) and diastolic (congestive) heart failure: Secondary | ICD-10-CM

## 2016-02-27 NOTE — Telephone Encounter (Signed)
Spoke with patient who c/o bilateral lower extremity swelling, worse yesterday.  He states he elevated his legs and wore compression hose last night and his legs are better this morning.  He states he does notice mild increase in SOB.  He is currently on amlodipine 5 mg daily.  I advised him that this medication can cause leg swelling and advised that he hold it for a few days to see if swelling improves.  We discussed increasing lasix and the patient states he does not want to get this information confused.  He asked me if his wife could call me to get the directions. I spoke with patient's wife and advised her to have patient hold amlodipine.  Increase lasix to 80 mg in the am and 40 mg in the pm and to increase klor-con to 20 meq in the am and 10 meq in the pm x 3 days.  I advised her he will need to come to the office Monday for bmet.   I advised patient to monitor BP daily - 1 hour after medications and to continue to elevate legs and use compression hose.  I advised patient's wife to call back if patient has worsening symptoms between now and Friday afternoon.  She verbalized understanding and agreement and thanked me for the call.

## 2016-02-27 NOTE — Telephone Encounter (Signed)
Follow up  ° ° °Patient returning call back to nurse  °

## 2016-02-27 NOTE — Telephone Encounter (Signed)
Please call,he wants to discuss his condition with you.

## 2016-02-28 NOTE — Telephone Encounter (Signed)
Agree with note from Michelle Swinyer, RN  

## 2016-03-03 ENCOUNTER — Other Ambulatory Visit (INDEPENDENT_AMBULATORY_CARE_PROVIDER_SITE_OTHER): Payer: Medicare Other | Admitting: *Deleted

## 2016-03-03 DIAGNOSIS — I5042 Chronic combined systolic (congestive) and diastolic (congestive) heart failure: Secondary | ICD-10-CM

## 2016-03-03 LAB — BASIC METABOLIC PANEL
BUN: 40 mg/dL — ABNORMAL HIGH (ref 7–25)
CHLORIDE: 102 mmol/L (ref 98–110)
CO2: 29 mmol/L (ref 20–31)
Calcium: 9.5 mg/dL (ref 8.6–10.3)
Creat: 1.24 mg/dL — ABNORMAL HIGH (ref 0.70–1.18)
GLUCOSE: 124 mg/dL — AB (ref 65–99)
Potassium: 4.5 mmol/L (ref 3.5–5.3)
Sodium: 143 mmol/L (ref 135–146)

## 2016-03-07 ENCOUNTER — Telehealth: Payer: Self-pay | Admitting: Cardiovascular Disease

## 2016-03-07 NOTE — Telephone Encounter (Signed)
Spoke with patient who was recently advised to hold amlodipine and double up on lasix and potassium for 3 days due to leg swelling.  He had a bmet on 3/6 and Dr. Acie Fredrickson advised that lab results are stable.  Patient states his bilateral lower extremity swelling is much improved; he states his legs look "almost normal."  States systolic BP has been [id number] mmHg and diastolic BP has been [id number] mmHg.  He states he is feeling well.  I advised him to continue to hold the amlodipine and that he should have resumed previous dosages of lasix 40 mg bid and kdur 10 meq bid.  I scheduled his 6 mo follow-up appointment for June.  I advised him to call back with questions or concerns prior to next appointment.   He verbalized understanding and agreement and thanked me for the call.

## 2016-03-07 NOTE — Telephone Encounter (Signed)
Follow Up ° °Pt returned call//  °

## 2016-04-17 ENCOUNTER — Encounter: Payer: Self-pay | Admitting: Cardiovascular Disease

## 2016-06-04 ENCOUNTER — Ambulatory Visit: Payer: Medicare Other | Admitting: Cardiovascular Disease

## 2016-06-26 ENCOUNTER — Ambulatory Visit: Payer: Medicare Other | Admitting: Cardiovascular Disease

## 2016-07-17 ENCOUNTER — Encounter (INDEPENDENT_AMBULATORY_CARE_PROVIDER_SITE_OTHER): Payer: Self-pay

## 2016-07-17 ENCOUNTER — Ambulatory Visit (INDEPENDENT_AMBULATORY_CARE_PROVIDER_SITE_OTHER): Payer: Medicare Other | Admitting: Cardiovascular Disease

## 2016-07-17 ENCOUNTER — Encounter: Payer: Self-pay | Admitting: Cardiovascular Disease

## 2016-07-17 VITALS — BP 128/52 | HR 62 | Ht 74.0 in | Wt 294.0 lb

## 2016-07-17 DIAGNOSIS — I5042 Chronic combined systolic (congestive) and diastolic (congestive) heart failure: Secondary | ICD-10-CM | POA: Diagnosis not present

## 2016-07-17 DIAGNOSIS — I10 Essential (primary) hypertension: Secondary | ICD-10-CM

## 2016-07-17 LAB — BASIC METABOLIC PANEL
BUN: 37 mg/dL — ABNORMAL HIGH (ref 7–25)
CHLORIDE: 104 mmol/L (ref 98–110)
CO2: 28 mmol/L (ref 20–31)
CREATININE: 1.54 mg/dL — AB (ref 0.70–1.18)
Calcium: 9.2 mg/dL (ref 8.6–10.3)
Glucose, Bld: 119 mg/dL — ABNORMAL HIGH (ref 65–99)
Potassium: 4.5 mmol/L (ref 3.5–5.3)
Sodium: 142 mmol/L (ref 135–146)

## 2016-07-17 MED ORDER — TORSEMIDE 20 MG PO TABS: 20.0000 mg | ORAL_TABLET | Freq: Two times a day (BID) | ORAL | Status: DC

## 2016-07-17 NOTE — Progress Notes (Signed)
Cardiology Office Note   Date:  07/17/2016   ID:  Travis [name], Travis [name] 09-Dec-1946, MRN [id number]  PCP:  Simona Huh, MD  Cardiologist:   Mertie Moores, MD   No chief complaint on file.  1. Hypertension 2. Hypothyroidism 3. DM 4. OSA 5. Mild pulmonary hypertensino 6. Chronic diastolic dysfunction   History of Present Illness: Travis [name] is seen back today for a post hospital visit. He is the father - in -law of Travis [name] ([employer] nurse) and Travis [name] ([place]) I saw him in consultatoin on July 17, 2014 for issues related to his diastolic dysfunction in the setting of pneumonia / respiratory failure He was seen By Truitt Merle in late August for follow up of his hospitalization.   [name] He is a 70 year old male with HTN, hypothyroidism, OSA, poorly controlled type 2 DM, former smoker and DJD.  Admitted back in July with CAP/UTI with associated sepsis. Was intubated due to progressive respiratory failure. Had AKI/ATN in the setting of PNA, hypoxic RF and septic shock. Felt to have some degree of diastolic HF - echo was not of good quality. Apparently has had past high sodium diet and uncontrolled HTN. Looks like he was on Lisinopril HCT, cardura and a lower dose of beta blocker. Mildly elevated troponin but no anginal symptoms and this was suspected to be from respiratory failure/CAP with septic shock.   Was seen by Cecille Rubin on August 28, Reported getting stronger, Breathing was ok. Leg edema has improved.   Sept. 9, 2015:  Travis [name] is seen today with his wife. Walking with a walker. Getting stronger every day.  Has cut out his high salt foods.  He i still working Administrator, sports estate)  Has been seen by home health nurses - HR has been slow. No episodes of syncope.   Sept. 24, 2015:  Travis [name] is doing OK but continues to gradually gained strength. His home health nurse recently noted that his heart rate was very slow. Occasionally have some episodes of very mild  lightheadedness when he sat on the toilet type of bowel movement. He has not had episodes of syncope and seems to be doing pretty well at other times. He Has not had any syncope.   Dec. 11, 2015:  Travis [name] is here today for his HTN,  Has OSA - has been wearing his CPAP. , no CP and no dyspnea. Occasional eipisodes of orthostatic hypotension. Making progress with his rehab but he had to suspend it because of his elevated BP Does his best to avoid salt.  We reviewed his diet - no high salt items typically .  BP at the rehab is typically elevated but    March 09, 2015:   Travis [name] is a 70 y.o. male who presents for follow-up of his high blood pressure. He slowly getting stronger but still walks with the assistance of a walker. He has some left leg pain and he has had to re-start using his walker again - was using a cane.  Wears his CPAP at night.  BP yesterday was [id number] systolic .   He had some lightheadedness yesterday - glucose was ok.  BP was ok  August 17, 2015:  Doing well. Going to rehab -  BP looks great  No CP or dyspnea   Sept. 27, 2016: Doing well. Still has lots of issues with leg swelling . Elevates his legs at night - legs are much better in the am.  Cath revealed mildly elevated right sided pressures. Has mild - moderate CAD  LV EF is 55-60%  We doubled his lasix to 40 BID for several days and felt much better .    Dec. 5, 2016: Travis [name] is seen today for follow up of his CHF. Seems to be getting stronger. Able to get around with a cane - which is an improvement  Doing some calf exercises which has helped the leg swelling .   July 17, 2016;  Travis [name] is seen today  Runs out of breath before he should  O2 sats here are 88% on room air.   [name] has a pulse-ox - his typical O2 sate is between 92%-94%.  Cath last year showed mild - mod irreg. Echo shows normal LV function with grade 2 diastolic dysfunction . Not eating any extra salt    Past Medical  History  Diagnosis Date  . PONV (postoperative nausea and vomiting)   . Hypertension   . DJD (degenerative joint disease)     WHOLE SPINE  . Hyperthyroidism     Had Iodine to resolve issue DX 10 YR AGO  . Hypothyroidism     Low d/t treatment of hyperthyroidism  . Sleep apnea     DX  2009/2010 [facility]  . Type II diabetes mellitus (HCC)     diet controlled  . Heart murmur     Evaluated by cardiologist 4-5 years ago but does not currently see a cardiologist  . Gait disorder   . Cervical myelopathy (HCC)     with myelomalacia at C5-6 and C3-4  . RLS (restless legs syndrome)   . Peripheral neuropathy ([place])   . 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 Constipation - functional     controlled with miralax  . RBBB   . 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 CHF (congestive heart failure) ([place])   . Pneumonia   . Restless legs syndrome 03/14/2015  . Renal disorder     acute kidney failure    Past Surgical History  Procedure Laterality Date  . Cholecystectomy    . Back surgery      6 surgeries  . Lumbar laminectomy/decompression microdiscectomy  07/14/2012    Procedure: LUMBAR LAMINECTOMY/DECOMPRESSION MICRODISCECTOMY 2 LEVELS;  Surgeon: Eustace Moore, MD;  Location: [facility];  Service: Neurosurgery;  Laterality: Left;  Lumbar two three laminectomy, left thoracic four five transpedicular diskectomy  . Cardiac catheterization [name] 09/07/2015    Procedure: Right/Left Heart Cath and Coronary Angiography;  Surgeon: Peter M Martinique, MD;  Location: [facility];  Service: Cardiovascular;  Laterality: [name];     Current Outpatient Prescriptions  Medication Sig Dispense Refill  . aspirin EC 81 MG EC tablet Take 1 tablet (81 mg total) by mouth daily. 30 tablet 0  . baclofen (LIORESAL) 10 MG tablet Take 1 tablet (10 mg total) by mouth 3 (three) times daily. For muscle spasms 90 each 1  . diclofenac (VOLTAREN) 75 MG EC tablet Take 75 mg by mouth daily.  2  . escitalopram  (LEXAPRO) 10 MG tablet Take 10 mg by mouth daily.  2  . furosemide (LASIX) 40 MG tablet Take 40 mg by mouth 2 (two) times daily.     [name] gabapentin (NEURONTIN) 400 MG capsule TAKE 1 CAPSULE (400 MG TOTAL) BY MOUTH EVERY 8 (EIGHT) HOURS. 90 capsule 0  . glimepiride (AMARYL) 2 MG tablet Take 2 mg by mouth daily with breakfast.    .  hydrALAZINE (APRESOLINE) 25 MG tablet Take 1 tablet (25 mg total) by mouth every 8 (eight) hours. 90 tablet 0  . levothyroxine (SYNTHROID, LEVOTHROID) 175 MCG tablet Take 175 mcg by mouth daily.  2  . losartan (COZAAR) 50 MG tablet Take 1 tablet (50 mg total) by mouth daily. 90 tablet 3  . polyethylene glycol (MIRALAX / GLYCOLAX) packet Take 17 g by mouth daily as needed. For constipation 14 each 0  . potassium chloride (KLOR-CON 10) 10 MEQ tablet Take 1 tablet (10 mEq total) by mouth 2 (two) times daily. 180 tablet 3  . pramipexole (MIRAPEX) 0.5 MG tablet Take 0.5 mg by mouth 2 (two) times daily.     . tamsulosin (FLOMAX) 0.4 MG CAPS capsule Take 0.4 mg by mouth daily.     . TOVIAZ 8 MG TB24 tablet Take 8 mg by mouth daily.  11   No current facility-administered medications for this visit.    Allergies:   Other    Social History:  The patient  reports that he quit smoking about 31 years ago. His smoking use included Cigarettes. He has a 40 pack-year smoking history. He quit smokeless tobacco use about 31 years ago. He reports that he does not drink alcohol or use illicit drugs.   Family History:  The patient's family history includes Diabetes in his father; Heart disease in his father; Hypertension in his father; Polycythemia in his mother.    ROS:  Please see the history of present illness.    Review of Systems: Constitutional:  denies fever, chills, diaphoresis, appetite change and fatigue.  HEENT: denies photophobia, eye pain, redness, hearing loss, ear pain, congestion, sore throat, rhinorrhea, sneezing, neck pain, neck stiffness and tinnitus.  Respiratory:  admits to SOB, DOE, cough, chest tightness, and wheezing.  Cardiovascular: admits to   leg swelling.  Gastrointestinal: denies nausea, vomiting, abdominal pain, diarrhea, constipation, blood in stool.  Genitourinary: denies dysuria, urgency, frequency, hematuria, flank pain and difficulty urinating.  Musculoskeletal: denies  myalgias, back pain, joint swelling, arthralgias and gait problem.   Skin: denies pallor, rash and wound.  Neurological: denies dizziness, seizures, syncope, weakness, light-headedness, numbness and headaches.   Hematological: denies adenopathy, easy bruising, personal or family bleeding history.  Psychiatric/ Behavioral: denies suicidal ideation, mood changes, confusion, nervousness, sleep disturbance and agitation.       All other systems are reviewed and negative.     PHYSICAL EXAM: VS:  BP 128/52 mmHg  Pulse 62  Ht 6\' 2"  (1.88 m)  Wt 254 lb 1.9 oz (115.268 kg)  BMI 32.61 kg/m2  SpO2 88% , BMI Body mass index is 32.61 kg/(m^2).   Weight is up 8 lbs from previous visit .  GEN: Well nourished, well developed, in no acute distress HEENT: normal Neck: no JVD, carotid bruits, or masses Cardiac: RRR; no murmurs, rubs, or gallops, 1-2 + pitting  edema  Respiratory:  clear to auscultation bilaterally, normal work of breathing GI:  +  Abdominal fluid  MS: no deformity or atrophy Skin: warm and dry, no rash Neuro:  Strength and sensation are intact.  He was walking with the assistance of a walker.  Gait is very slow  Psych: normal  EKG:  EKG is ordered today. The ekg ordered today demonstrates normal sinus rhythm at 62.  He has right bundle branch block.   Recent Labs: 08/19/2015: ALT 36; B Natriuretic Peptide 94.3 08/20/2015: TSH 0.299* 08/23/2015: Magnesium 2.2 08/27/2015: Hemoglobin 12.7*; Platelets 245.0 03/03/2016: BUN 40*; Creat 1.24*;  Potassium 4.5; Sodium 143    Lipid Panel    Component Value Date/Time   CHOL 124 08/20/2015 1458   TRIG 82  08/20/2015 1458   HDL 29* 08/20/2015 1458   CHOLHDL 4.3 08/20/2015 1458   VLDL 16 08/20/2015 1458   LDLCALC 79 08/20/2015 1458      Wt Readings from Last 3 Encounters:  07/17/16 254 lb 1.9 oz (115.268 kg)  12/14/15 286 lb (129.729 kg)  12/03/15 288 lb 3.2 oz (130.727 kg)      Other studies Reviewed: Additional studies/ records that were reviewed today include: . Review of the above records demonstrates:    ASSESSMENT AND PLAN:  1. Hypertension- BP is doing well controlled   2. Hypothyroidism 3. DM- glucose levels have been well-controlled. 4. 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  His Weight is up 8 pounds from last visit. I suspect these are related. He's currently on furosemide 40 mg a day. We'll discontinue the furosemide and start him on torsemide 20 mg twice a day. He'll return to see a PA in approximate one week. He'll need a basic medical profile at that time.   6. Generalized weakness:  He's had generalized weakness for years.  I've asked to discuss this with his general medical doctor.  Current medicines are reviewed at length with the patient today.  The patient does not have concerns regarding medicines.    Labs/ tests ordered today include:   No orders of the defined types were placed in this encounter.    Disposition:   FU with a PA in 1 week.   me in 3 months   Signed, Mertie Moores, MD  07/17/2016 3:46 PM    [address], [place], [place]  [address] Phone: [phone]; Fax: [phone]

## 2016-07-17 NOTE — Patient Instructions (Signed)
Medication Instructions:  STOP Furosemide (Lasix) START Torsemide 20 mg twice daily   Labwork: TODAY - basic metabolic panel  Your physician recommends that you return for lab work in: 1 week for basic metabolic panel on same day as appointment with PA/NP    Testing/Procedures: None Ordered   Follow-Up: Your physician recommends that you schedule a follow-up appointment in: 1 week with PA/NP (also needs bmet)   If you need a refill on your cardiac medications before your next appointment, please call your pharmacy.   Thank you for choosing [facility]! Christen Bame, RN [phone]

## 2016-07-19 ENCOUNTER — Telehealth: Payer: Self-pay | Admitting: Internal Medicine

## 2016-07-19 NOTE — Telephone Encounter (Signed)
Cardiology Plan of Care  I received a call from the pt's daughter-in-law who is a Marine scientist here at [facility] [facility] [employer].  She expressed concern that her father-in-law is sluggish since recently being transition from Furosemide 40 mg daily to Torsemide 20 mg BID.  He is urinating well, & his weight is down 3 lbs.  I advised that they decrease the Torsemide to 20 mg daily unless he starts to gain weight again.  She stated that they have a follow-up appointment in 4 days.  Frann Rider, MD

## 2016-07-21 ENCOUNTER — Encounter: Payer: Self-pay | Admitting: Cardiovascular Disease

## 2016-07-21 NOTE — Telephone Encounter (Signed)
New message    Patient calling back to speak with nurse from Friday.

## 2016-07-21 NOTE — Telephone Encounter (Signed)
This encounter was created in error - please disregard. This encounter was created in error - please disregard. This encounter was created in error - please disregard. 

## 2016-07-22 ENCOUNTER — Telehealth: Payer: Self-pay

## 2016-07-22 NOTE — Telephone Encounter (Signed)
Torsemide approved by [employer]. Local pharmacy notified. Approval good through 12/28/2016.

## 2016-07-23 ENCOUNTER — Other Ambulatory Visit: Payer: Medicare Other

## 2016-07-23 ENCOUNTER — Encounter: Payer: Self-pay | Admitting: Cardiology

## 2016-07-23 ENCOUNTER — Ambulatory Visit (INDEPENDENT_AMBULATORY_CARE_PROVIDER_SITE_OTHER): Payer: Medicare Other | Admitting: Cardiology

## 2016-07-23 VITALS — BP 142/50 | HR 56 | Ht 73.0 in | Wt 290.0 lb

## 2016-07-23 DIAGNOSIS — I5042 Chronic combined systolic (congestive) and diastolic (congestive) heart failure: Secondary | ICD-10-CM

## 2016-07-23 DIAGNOSIS — I10 Essential (primary) hypertension: Secondary | ICD-10-CM

## 2016-07-23 DIAGNOSIS — Z5181 Encounter for therapeutic drug level monitoring: Secondary | ICD-10-CM | POA: Diagnosis not present

## 2016-07-23 DIAGNOSIS — G4730 Sleep apnea, unspecified: Secondary | ICD-10-CM

## 2016-07-23 DIAGNOSIS — R5383 Other fatigue: Secondary | ICD-10-CM | POA: Diagnosis not present

## 2016-07-23 NOTE — Telephone Encounter (Signed)
Agree with plan to decrease the torsemide His renal function was a bit decreased at his last visit. Will need to make sure it hasnt worsened. He has an appt to see a PA soon, I would have a low threshold to admit him if he is still having troubles.

## 2016-07-23 NOTE — Progress Notes (Signed)
07/23/2016 Travis [name]   July 31, 1946  [id number]  Primary Physician Simona Huh, MD Primary Cardiologist: Dr. Acie Fredrickson   Reason for Visit/CC: F/u for acute on chronic diastolic CHF  HPI:  70 y/o male with h/o HTN, hypothyroidism, OSA, poorly controlled type 2 DM, former smoker, DJD and chronic diastolic dysfunction. He was admitted July 2016 with CAP/UTI with associated sepsis. Was intubated due to progressive respiratory failure. Had AKI/ATN in the setting of PNA, hypoxic RF and septic shock. His last echo was 07/2015. EF was 55-60%. He had a [name] 08/2015 which showed mild-moderate nonobstructive CAD and mild pulmonary HTN with normal LV filling pressures. Angiographic details are outlined below:   Prox RCA to Mid RCA lesion, 40% stenosed.  Ost Cx to Prox Cx lesion, 50% stenosed.  LM lesion, 40% stenosed.  The left ventricular systolic function is normal.  Dist LAD lesion, 40% stenosed.   1. Nonobstructive CAD 2. Normal LV function. 3. Mild pulmonary HTN with normal LV filling pressures.   He was recently seen by Dr. Acie Fredrickson 07/17/16. He complained of an 8 lb weight gain + dyspnea. He was hypoxic with O2 sats of 88% in the office. Dr. Acie Fredrickson discontinued his Lasix and started him on torsemide, 20 mg BID. Per records, he called over the weekend with complaint of increased fatigue/ sluggish feeling after starting torsemide. He was advised by the on call Fellow to reduce frequency down to once daily. He presents back to clinic for 1 week f/u. His office weight has improved, down from 294 1 week ago to 290 lb today. He notes improvement in symptoms. No dyspnea. LEE has improved as well as decreased abdominal distention. He feels less sluggish and did increase his torsemide back to BID. He has tolerated this adjustment well. No CP. He does note chronic daytime fatigue. He notes that he has OSA and is fully compliant with CPAP but has not had his machine adjusted in over 10 years.      Current Outpatient Prescriptions  Medication Sig Dispense Refill  . aspirin EC 81 MG EC tablet Take 1 tablet (81 mg total) by mouth daily. 30 tablet 0  . baclofen (LIORESAL) 10 MG tablet Take 1 tablet (10 mg total) by mouth 3 (three) times daily. For muscle spasms 90 each 1  . diclofenac (VOLTAREN) 75 MG EC tablet Take 75 mg by mouth daily.  2  . escitalopram (LEXAPRO) 10 MG tablet Take 10 mg by mouth daily.  2  . gabapentin (NEURONTIN) 400 MG capsule TAKE 1 CAPSULE (400 MG TOTAL) BY MOUTH EVERY 8 (EIGHT) HOURS. 90 capsule 0  . glimepiride (AMARYL) 2 MG tablet Take 2 mg by mouth daily with breakfast.    . hydrALAZINE (APRESOLINE) 25 MG tablet Take 1 tablet (25 mg total) by mouth every 8 (eight) hours. 90 tablet 0  . levothyroxine (SYNTHROID, LEVOTHROID) 175 MCG tablet Take 175 mcg by mouth daily.  2  . losartan (COZAAR) 50 MG tablet Take 1 tablet (50 mg total) by mouth daily. 90 tablet 3  . polyethylene glycol (MIRALAX / GLYCOLAX) packet Take 17 g by mouth daily as needed. For constipation 14 each 0  . potassium chloride (KLOR-CON 10) 10 MEQ tablet Take 1 tablet (10 mEq total) by mouth 2 (two) times daily. 180 tablet 3  . pramipexole (MIRAPEX) 0.5 MG tablet Take 0.5 mg by mouth 2 (two) times daily.     . tamsulosin (FLOMAX) 0.4 MG CAPS capsule Take 0.4 mg by mouth daily.     [name]  torsemide (DEMADEX) 20 MG tablet Take 1 tablet (20 mg total) by mouth 2 (two) times daily. 60 tablet 11  . TOVIAZ 8 MG TB24 tablet Take 8 mg by mouth daily.  11   No current facility-administered medications for this visit.     Allergies  Allergen Reactions  . Other Other (See Comments)    Anesthesia makes sick, must have antinausea medication in advance.    Social History   Social History  . Marital status: Married    Spouse name: [name]  . Number of children: 3  . Years of education: 3   Occupational History  .  [employer]    Works in [place] History Main Topics   . Smoking status: Former Smoker    Packs/day: 2.00    Years: 20.00    Types: Cigarettes    Quit date: 06/28/1985  . Smokeless tobacco: Former Systems developer    Quit date: 06/28/1985  . Alcohol use No  . Drug use: No  . Sexual activity: Not on file   Other Topics Concern  . Not on file   Social History Narrative   Patient is married with 3 children.   Patient has 16 yrs of education.   Patient is right handed.   Patient drinks 2 cups of caffeine per day.        Review of Systems: General: negative for chills, fever, night sweats or weight changes.  Cardiovascular: negative for chest pain, dyspnea on exertion, edema, orthopnea, palpitations, paroxysmal nocturnal dyspnea or shortness of breath Dermatological: negative for rash Respiratory: negative for cough or wheezing Urologic: negative for hematuria Abdominal: negative for nausea, vomiting, diarrhea, bright red blood per rectum, melena, or hematemesis Neurologic: negative for visual changes, syncope, or dizziness All other systems reviewed and are otherwise negative except as noted above.    Blood pressure (!) 142/50, pulse (!) 56, height 6\' 1"  (1.854 m), weight 290 lb (131.5 kg).  General appearance: alert, cooperative and no distress Neck: no carotid bruit and no JVD Lungs: clear to auscultation bilaterally Heart: regular rate and rhythm, S1, S2 normal, no murmur, click, rub or gallop Extremities: 1+ bilateral LEE Pulses: 2+ and symmetric Skin: warm and dry Neurologic: Grossly normal  EKG not performed.   ASSESSMENT AND PLAN:   1. Chronic Diastolic HF: volume status improved after switching diuretic from Lasix to torsemide. Weight is improved from 294-290 lb. Breathing improved. LEE improved. He notes improved UOP compared to Lasix. We will check a  repeat BMP to ensure that renal function and K are both stable. Continue torsemide, daily weights and low sodium diet.   2. Mild to Moderate CAD: stable without anginal symptoms.  Continue medical therapy.   3. OSA: patient reports full compliance with CPAP. He notes chronic daytime fatigue. He has not had his CPAP adjusted in over 10 years. We will order a repeat sleep study.   PLAN  F/u BMP today. Repeat sleep study. F/u with Dr. Acie Fredrickson in 6 months or sooner if needed.   Lyda Jester [name] 07/23/2016 4:30 PM

## 2016-07-23 NOTE — Patient Instructions (Addendum)
Medication Instructions:  Continue TAKING  Torsemide Take 1 tab (20mg ) by mouth twice a day  Your physician recommends that you continue on your current medications as directed. Please refer to the Current Medication list given to you today.  Labwork: Your physician recommends that you have lab work TODAY BMET and [place] 6 WEEKS  Testing/Procedures: Your physician has recommended that you have a sleep study. This test records several body functions during sleep, including: brain activity, eye movement, oxygen and carbon dioxide blood levels, heart rate and rhythm, breathing rate and rhythm, the flow of air through your mouth and nose, snoring, body muscle movements, and chest and belly movement.   Talbert Cage, CMA will call you to schedule.  Follow-Up: Your physician wants you to follow-up in: 6 months WITH DR [facility]. You will receive a reminder letter in the mail two months in advance. If you don't receive a letter, please call our office to schedule the follow-up appointment.   Any Other Special Instructions Will Be Listed Below (If Applicable).     If you need a refill on your cardiac medications before your next appointment, please call your pharmacy.

## 2016-07-24 LAB — BASIC METABOLIC PANEL
BUN: 60 mg/dL — AB (ref 7–25)
CO2: 28 mmol/L (ref 20–31)
CREATININE: 1.64 mg/dL — AB (ref 0.70–1.18)
Calcium: 9.3 mg/dL (ref 8.6–10.3)
Chloride: 103 mmol/L (ref 98–110)
GLUCOSE: 134 mg/dL — AB (ref 65–99)
POTASSIUM: 4.8 mmol/L (ref 3.5–5.3)
Sodium: 141 mmol/L (ref 135–146)

## 2016-07-28 ENCOUNTER — Telehealth: Payer: Self-pay | Admitting: Cardiology

## 2016-07-28 DIAGNOSIS — I10 Essential (primary) hypertension: Secondary | ICD-10-CM

## 2016-07-28 NOTE — Telephone Encounter (Signed)
Follow Up:     Returning your call from [place] his lab results.

## 2016-07-29 NOTE — Telephone Encounter (Signed)
-----   Message from Consuelo Pandy, [place] sent at 07/24/2016  3:16 PM EDT ----- K is stable. Slight bump in SCr (measure of kidney function) after switch from lasix to torsemide. Recheck BMP in 1 week to recheck SCr.

## 2016-07-29 NOTE — Telephone Encounter (Signed)
Pt aware of lab results.  He will have repeat bmet 08/01/16. Order in epic. Pt verbalized understanding.

## 2016-07-29 NOTE — Telephone Encounter (Signed)
Follow Up:; ° ° °Returning your call. °

## 2016-08-01 ENCOUNTER — Other Ambulatory Visit: Payer: Medicare Other | Admitting: *Deleted

## 2016-08-01 DIAGNOSIS — I10 Essential (primary) hypertension: Secondary | ICD-10-CM

## 2016-08-01 LAB — BASIC METABOLIC PANEL
BUN: 49 mg/dL — ABNORMAL HIGH (ref 7–25)
CHLORIDE: 106 mmol/L (ref 98–110)
CO2: 27 mmol/L (ref 20–31)
Calcium: 9.1 mg/dL (ref 8.6–10.3)
Creat: 1.56 mg/dL — ABNORMAL HIGH (ref 0.70–1.18)
Glucose, Bld: 126 mg/dL — ABNORMAL HIGH (ref 65–99)
POTASSIUM: 4.7 mmol/L (ref 3.5–5.3)
SODIUM: 143 mmol/L (ref 135–146)

## 2016-08-12 ENCOUNTER — Telehealth: Payer: Self-pay | Admitting: Cardiovascular Disease

## 2016-08-12 NOTE — Telephone Encounter (Signed)
New message  ° ° °Pt verbalized that he is returning call for rn °

## 2016-08-12 NOTE — Telephone Encounter (Signed)
  Patient informed.     Notes Recorded by Consuelo Pandy, [name] on 08/11/2016 at 1:23 PM EDT Renal function has improved since last set of labs, back near baseline. K is stable. Ok to continue Torsemide as prescribed. ------

## 2016-09-05 ENCOUNTER — Other Ambulatory Visit: Payer: Medicare Other | Admitting: *Deleted

## 2016-09-05 DIAGNOSIS — Z5181 Encounter for therapeutic drug level monitoring: Secondary | ICD-10-CM

## 2016-09-05 LAB — BASIC METABOLIC PANEL
BUN: 44 mg/dL — AB (ref 7–25)
CALCIUM: 9.1 mg/dL (ref 8.6–10.3)
CO2: 28 mmol/L (ref 20–31)
CREATININE: 1.88 mg/dL — AB (ref 0.70–1.18)
Chloride: 104 mmol/L (ref 98–110)
GLUCOSE: 100 mg/dL — AB (ref 65–99)
Potassium: 4.6 mmol/L (ref 3.5–5.3)
SODIUM: 143 mmol/L (ref 135–146)

## 2016-09-08 ENCOUNTER — Telehealth: Payer: Self-pay | Admitting: Nurse Practitioner

## 2016-09-08 DIAGNOSIS — I5032 Chronic diastolic (congestive) heart failure: Secondary | ICD-10-CM

## 2016-09-08 MED ORDER — TORSEMIDE 20 MG PO TABS: 20.0000 mg | ORAL_TABLET | Freq: Every day | ORAL | 11 refills | Status: DC

## 2016-09-08 NOTE — Telephone Encounter (Signed)
Reviewed lab results with Dr. Acie Fredrickson.  Called and advised patient's wife, [name] [facility]) that per Dr. Acie Fredrickson, patient should decrease torsemide to 20 mg once daily.  He is scheduled for repeat bmet on 10/12. I advised her to call back with questions or concerns prior to follow-up.  She verbalized understanding and agreement.

## 2016-09-11 ENCOUNTER — Telehealth: Payer: Self-pay | Admitting: Cardiovascular Disease

## 2016-09-11 ENCOUNTER — Other Ambulatory Visit: Payer: Self-pay | Admitting: Nurse Practitioner

## 2016-09-11 MED ORDER — TORSEMIDE 20 MG PO TABS: 20.0000 mg | ORAL_TABLET | Freq: Two times a day (BID) | ORAL | 11 refills | Status: DC

## 2016-09-11 NOTE — Telephone Encounter (Signed)
Phone call from Daughter in [place]. [name] decreased his Torsemide 4 days ago. Has had leg edema and increased shortness of breath since that time Will increase the Trosemide to 20 mg BID. Continue current dose of Kdur. Will recheck BMP in 2 weeks. Advised him to elevate his legs   [name] will MyChart [place] giving is further updates.     Mertie Moores, MD  09/11/2016 4:58 PM    [address] [place],  [place], [place]  [address] Pager [phone] Phone: [phone]; Fax: [phone]

## 2016-09-18 ENCOUNTER — Encounter (HOSPITAL_BASED_OUTPATIENT_CLINIC_OR_DEPARTMENT_OTHER): Payer: Medicare Other

## 2016-09-24 ENCOUNTER — Other Ambulatory Visit: Payer: Medicare Other | Admitting: *Deleted

## 2016-09-24 DIAGNOSIS — I5032 Chronic diastolic (congestive) heart failure: Secondary | ICD-10-CM

## 2016-09-24 LAB — BASIC METABOLIC PANEL
BUN: 50 mg/dL — ABNORMAL HIGH (ref 7–25)
CHLORIDE: 105 mmol/L (ref 98–110)
CO2: 26 mmol/L (ref 20–31)
Calcium: 9.1 mg/dL (ref 8.6–10.3)
Creat: 1.66 mg/dL — ABNORMAL HIGH (ref 0.70–1.18)
Glucose, Bld: 127 mg/dL — ABNORMAL HIGH (ref 65–99)
POTASSIUM: 4.9 mmol/L (ref 3.5–5.3)
Sodium: 142 mmol/L (ref 135–146)

## 2016-09-29 ENCOUNTER — Other Ambulatory Visit: Payer: Self-pay | Admitting: Cardiovascular Disease

## 2016-10-09 ENCOUNTER — Other Ambulatory Visit: Payer: Medicare Other

## 2016-10-21 ENCOUNTER — Encounter: Payer: Self-pay | Admitting: Cardiovascular Disease

## 2016-12-09 ENCOUNTER — Ambulatory Visit (INDEPENDENT_AMBULATORY_CARE_PROVIDER_SITE_OTHER): Payer: Medicare Other | Admitting: Neurology

## 2016-12-09 ENCOUNTER — Encounter: Payer: Self-pay | Admitting: Neurology

## 2016-12-09 VITALS — BP 140/58 | HR 54 | Resp 20 | Ht 73.0 in | Wt 291.0 lb

## 2016-12-09 DIAGNOSIS — M4712 Other spondylosis with myelopathy, cervical region: Secondary | ICD-10-CM | POA: Diagnosis not present

## 2016-12-09 NOTE — Progress Notes (Addendum)
Reason for visit: Cervical myelopathy  Travis [name] is an 70 y.o. male  History of present illness:  Travis [name] is a 70 year old right-handed white male with a history of a cervical myelopathy associated with a gait disorder. The patient has had some gradual worsening of his overall stamina since last seen one year ago. The patient finds that he is not able to walk as long as he used to. The patient not had any falls, he walks with a walker, he may have some shortness of breath with walking but this does not limit his ability to ambulate. The patient does occasionally have some pain in interscapular area, but usually the pain level is tolerable. The patient denies any issues controlling the bowels or the bladder. He returns to this office for an evaluation. No other new medical issues have come up since last seen.  Past Medical History:  Diagnosis Date  . Cervical myelopathy (HCC)    with myelomalacia at C5-6 and C3-4  . CHF (congestive heart failure) ([place])   . Constipation - functional    controlled with miralax  . DJD (degenerative joint disease)    WHOLE SPINE  . Gait disorder   . Heart murmur    Evaluated by cardiologist 4-5 years ago but does not currently see a cardiologist  . Hypertension   . Hyperthyroidism    Had Iodine to resolve issue DX 10 YR AGO  . Hypothyroidism    Low d/t treatment of hyperthyroidism  . 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 Peripheral neuropathy ([place])   . Pneumonia   . PONV (postoperative nausea and vomiting)   . RBBB   . Renal disorder    acute kidney failure  . Restless legs syndrome 03/14/2015  . RLS (restless legs syndrome)   . Sleep apnea    DX  2009/2010 study done at [facility]  . 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 Type II diabetes mellitus ([place])    diet controlled    Past Surgical History:  Procedure Laterality Date  . BACK SURGERY     6 surgeries  . CARDIAC CATHETERIZATION [name] 09/07/2015   Procedure: Right/Left Heart Cath  and Coronary Angiography;  Surgeon: Peter M Martinique, MD;  Location: [facility];  Service: Cardiovascular;  Laterality: [name];  . CHOLECYSTECTOMY    . LUMBAR LAMINECTOMY/DECOMPRESSION MICRODISCECTOMY  07/14/2012   Procedure: LUMBAR LAMINECTOMY/DECOMPRESSION MICRODISCECTOMY 2 LEVELS;  Surgeon: Eustace Moore, MD;  Location: [facility];  Service: Neurosurgery;  Laterality: Left;  Lumbar two three laminectomy, left thoracic four five transpedicular diskectomy    Family History  Problem Relation Age of Onset  . Polycythemia Mother   . Diabetes Father   . Hypertension Father   . Heart disease Father   . CAD      1 of his 4 brothers and father has CAD    Social history:  reports that he quit smoking about 31 years ago. His smoking use included Cigarettes. He has a 40.00 pack-year smoking history. He quit smokeless tobacco use about 31 years ago. He reports that he does not drink alcohol or use drugs.    Allergies  Allergen Reactions  . Other Other (See Comments)    Anesthesia makes sick, must have antinausea medication in advance.    Medications:  Prior to Admission medications   Medication Sig Start Date End Date Taking? Authorizing Provider  aspirin EC 81 MG EC tablet Take 1 tablet (81 mg total)  by mouth daily. 08/23/15  Yes Thurnell Lose, MD  baclofen (LIORESAL) 10 MG tablet Take 1 tablet (10 mg total) by mouth 3 (three) times daily. For muscle spasms 10/30/14  Yes Meredith Staggers, MD  diclofenac (VOLTAREN) 75 MG EC tablet Take 75 mg by mouth daily. 07/14/15  Yes Historical Provider, MD  escitalopram (LEXAPRO) 10 MG tablet Take 10 mg by mouth daily. 07/12/15  Yes Historical Provider, MD  gabapentin (NEURONTIN) 400 MG capsule TAKE 1 CAPSULE (400 MG TOTAL) BY MOUTH EVERY 8 (EIGHT) HOURS. 04/16/15  Yes Meredith Staggers, MD  glimepiride (AMARYL) 2 MG tablet Take 2 mg by mouth daily with breakfast.   Yes Historical Provider, MD  hydrALAZINE (APRESOLINE) 25 MG tablet Take 1 tablet (25 mg  total) by mouth every 8 (eight) hours. 08/23/15  Yes Thurnell Lose, MD  levothyroxine (SYNTHROID, LEVOTHROID) 175 MCG tablet Take 175 mcg by mouth daily. 08/12/15  Yes Historical Provider, MD  losartan (COZAAR) 50 MG tablet TAKE 1 TABLET BY MOUTH ONCE DAILY. 09/30/16  Yes [name], MD  polyethylene glycol [facility] / Floria Raveling) packet Take 17 g by mouth daily as needed. For constipation 08/09/14  Yes Ivan Anchors Love, [name]  potassium chloride (KLOR-CON 10) 10 MEQ tablet Take 1 tablet (10 mEq total) by mouth 2 (two) times daily. 09/25/15  Yes [name], MD  pramipexole (MIRAPEX) 0.5 MG tablet Take 0.5 mg by mouth 2 (two) times daily.  08/09/14  Yes Ivan Anchors Love, [name]  tamsulosin (FLOMAX) 0.4 MG CAPS capsule Take 0.4 mg by mouth daily.  09/13/14  Yes Historical Provider, MD  torsemide (DEMADEX) 20 MG tablet Take 1 tablet (20 mg total) by mouth 2 (two) times daily. 09/11/16  Yes [name], MD  TOVIAZ 8 MG TB24 tablet Take 8 mg by mouth daily. 08/07/15  Yes Historical Provider, MD    ROS:  Out of a complete 14 system review of symptoms, the patient complains only of the following symptoms, and all other reviewed systems are negative.  Wheezing, shortness of breath Leg swelling Sleep apnea Cold intolerance, excessive thirst Back pain, walking difficulty  Blood pressure (!) 140/58, pulse (!) 54, resp. rate 20, height 6\' 1"  (1.854 m), weight 291 lb (132 kg).  Physical Exam  General: The patient is alert and cooperative at the time of the examination. The patient is moderately to markedly obese.  Skin: 3+ edema below the knees is noted bilaterally.   Neurologic Exam  Mental status: The patient is alert and oriented x 3 at the time of the examination. The patient has apparent normal recent and remote memory, with an apparently normal attention span and concentration ability.   Cranial nerves: Facial symmetry is present. Speech is normal, no aphasia or dysarthria is noted.  Extraocular movements are full. Visual fields are full.  Motor: The patient has good strength in the upper extremities. With the lower extremities, there is 4/5 strength with hip flexion bilaterally, 4+/5 strength with knee flexion and extension, no evidence of a footdrop on either side.  Sensory examination: Soft touch sensation is symmetric on the face, arms, and legs.  Coordination: The patient has good finger-nose-finger and heel-to-shin bilaterally.  Gait and station: The patient has a slightly wide-based, slow deliberate gait. The patient uses a walker for ambulation. Tandem was not attempted. Romberg is negative. The patient has significant difficulty arising from a seated position.  Reflexes: Deep tendon reflexes are symmetric.   Assessment/Plan:  1. Cervical  myelopathy  2. Gait disorder  The patient will contact our office in the next several weeks when he is able to start physical therapy, we will get this initiated to help improve stamina and increased strength in the legs. The patient is not getting any medications through this office at this time. We will have the patient follow-up through this office on an as-needed basis.  Jill Alexanders MD 12/09/2016 8:12 AM  [facility] [address] [place], [place] [address]  Phone [phone] Fax [phone]

## 2017-01-19 ENCOUNTER — Encounter (INDEPENDENT_AMBULATORY_CARE_PROVIDER_SITE_OTHER): Payer: Self-pay

## 2017-01-19 ENCOUNTER — Ambulatory Visit (INDEPENDENT_AMBULATORY_CARE_PROVIDER_SITE_OTHER): Payer: Medicare Other | Admitting: Cardiovascular Disease

## 2017-01-19 ENCOUNTER — Encounter: Payer: Self-pay | Admitting: Cardiovascular Disease

## 2017-01-19 VITALS — BP 144/66 | HR 58 | Ht 73.0 in | Wt 293.4 lb

## 2017-01-19 DIAGNOSIS — I5032 Chronic diastolic (congestive) heart failure: Secondary | ICD-10-CM

## 2017-01-19 DIAGNOSIS — Z79899 Other long term (current) drug therapy: Secondary | ICD-10-CM

## 2017-01-19 NOTE — Progress Notes (Signed)
Cardiology Office Note   Date:  01/19/2017   ID:  Travis [name], Travis [name] 11-15-1946, MRN [id number]  PCP:  Simona Huh, MD  Cardiologist:   Mertie Moores, MD   Chief Complaint  Patient presents with  . Follow-up    diastolic CHF   1. Hypertension 2. Hypothyroidism 3. DM 4. OSA 5. Mild pulmonary hypertension.  6. Chronic diastolic dysfunction   History of Present Illness: Travis [name] is seen back today for a post hospital visit. 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   [name] He is a 71 year old male with HTN, hypothyroidism, OSA, poorly controlled type 2 DM, former smoker and DJD.  Admitted back in July with CAP/UTI with associated sepsis. Was intubated due to progressive respiratory failure. Had AKI/ATN in the setting of PNA, hypoxic RF and septic shock. Felt to have some degree of diastolic HF - echo was not of good quality. Apparently has had past high sodium diet and uncontrolled HTN. Looks like he was on Lisinopril HCT, cardura and a lower dose of beta blocker. Mildly elevated troponin but no anginal symptoms and this was suspected to be from respiratory failure/CAP with septic shock.   Was seen by Travis [name] on August 28, Reported getting stronger, Breathing was ok. Leg edema has improved.   Sept. 9, 2015:  Travis [name] is seen today with his wife. Walking with a walker. Getting stronger every day.  Has cut out his high salt foods.  He i still working Administrator, [employer])  Has been seen by home health nurses - HR has been slow. No episodes of syncope.   Sept. 24, 2015:  Travis [name] is doing OK but continues to gradually gained strength. His home health nurse recently noted that his heart rate  was very slow. Occasionally have some episodes of very mild lightheadedness when he sat on the toilet type of bowel movement. He has not had episodes of syncope and seems to be doing pretty well at other times. He Has not had any syncope.   Dec. 11, 2015:  Travis [name] is here today for his HTN,  Has OSA - has been wearing his CPAP. , no CP and no dyspnea. Occasional eipisodes of orthostatic hypotension. Making progress with his rehab but he had to suspend it because of his elevated BP Does his best to avoid salt.  We reviewed his diet - no high salt items typically .  BP at the rehab is typically elevated but    March 09, 2015:   Travis [name] is a 71 y.o. male who presents for follow-up of his high blood pressure. He slowly getting stronger but still walks with the assistance of a walker. He has some left leg pain and he has had to re-start using his walker again - was using a cane.  Wears his CPAP at night.  BP yesterday was 740 -814 systolic .   He had some lightheadedness yesterday - glucose was ok.  BP was ok  August 17, 2015:  Doing well. Going to rehab -  BP looks great  No CP or dyspnea   Sept. 27, 2016: Doing well. Still has lots of issues with leg swelling .  Elevates his legs at night - legs are much better in the am.  Cath revealed mildly elevated right sided pressures. Has mild - moderate CAD  LV EF is 55-60%  We doubled his lasix to 40 BID for several days and felt much better .    Dec. 5, 2016: Travis [name] is seen today for follow up of his CHF. Seems to be getting stronger. Able to get around with a cane - which is an improvement  Doing some calf exercises which has helped the leg swelling .   July 17, 2016;  Travis [name] is seen today  Runs out of breath before he should  O2 sats here are 88% on room air.   Travis [name] has a pulse-ox - his typical O2 sate is between 92%-94%.  Cath last year showed mild - mod irreg. Echo shows normal LV function with grade 2 diastolic  dysfunction . Not eating any extra salt   Jan. 22, 2018:  Travis [name] is seen today wife his wife.  Walking with a walker  Has had leg swelling which has now resolved.  Trying to avoid salt.       Past Medical History:  Diagnosis Date  . Cervical myelopathy (HCC)    with myelomalacia at C5-6 and C3-4  . CHF (congestive heart failure) ([place])   . Constipation - functional    controlled with miralax  . DJD (degenerative joint disease)    WHOLE SPINE  . Gait disorder   . Heart murmur    Evaluated by cardiologist 4-5 years ago but does not currently see a cardiologist  . Hypertension   . Hyperthyroidism    Had Iodine to resolve issue DX 10 YR AGO  . Hypothyroidism    Low d/t treatment of hyperthyroidism  . 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 Peripheral neuropathy ([place])   . Pneumonia   . PONV (postoperative nausea and vomiting)   . RBBB   . Renal disorder    acute kidney failure  . Restless legs syndrome 03/14/2015  . RLS (restless legs syndrome)   . Sleep apnea    DX  2009/2010 study done at [facility]  . 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 Type II diabetes mellitus ([place])    diet controlled    Past Surgical History:  Procedure Laterality Date  . BACK SURGERY     6 surgeries  . CARDIAC CATHETERIZATION [name] 09/07/2015   Procedure: Right/Left Heart Cath and Coronary Angiography;  Surgeon: Peter M Martinique, MD;  Location: [facility];  Service: Cardiovascular;  Laterality: [name];  . CHOLECYSTECTOMY    . LUMBAR LAMINECTOMY/DECOMPRESSION MICRODISCECTOMY  07/14/2012   Procedure: LUMBAR LAMINECTOMY/DECOMPRESSION MICRODISCECTOMY 2 LEVELS;  Surgeon: Eustace Moore, MD;  Location: [facility];  Service: Neurosurgery;  Laterality: Left;  Lumbar two three laminectomy, left thoracic four five transpedicular diskectomy     Current Outpatient Prescriptions  Medication Sig Dispense Refill  . aspirin EC 81 MG EC tablet Take 1 tablet (81 mg total) by mouth daily. 30 tablet 0  . baclofen  (LIORESAL) 10 MG tablet Take 1 tablet (10 mg total) by mouth 3 (three) times daily. For muscle spasms 90 each 1  . diclofenac (VOLTAREN) 75 MG EC tablet Take 75 mg by mouth daily.  2  . escitalopram (LEXAPRO) 10 MG tablet Take 10 mg by mouth daily.  2  . gabapentin (NEURONTIN) 400 MG capsule TAKE 1 CAPSULE (400 MG TOTAL) BY MOUTH EVERY 8 (EIGHT) HOURS. 90 capsule  0  . glimepiride (AMARYL) 2 MG tablet Take 2 mg by mouth daily with breakfast.    . hydrALAZINE (APRESOLINE) 25 MG tablet Take 1 tablet (25 mg total) by mouth every 8 (eight) hours. 90 tablet 0  . levothyroxine (SYNTHROID, LEVOTHROID) 175 MCG tablet Take 175 mcg by mouth daily.  2  . losartan (COZAAR) 50 MG tablet TAKE 1 TABLET BY MOUTH ONCE DAILY. 90 tablet 2  . polyethylene glycol (MIRALAX / GLYCOLAX) packet Take 17 g by mouth daily as needed. For constipation 14 each 0  . potassium chloride (KLOR-CON 10) 10 MEQ tablet Take 1 tablet (10 mEq total) by mouth 2 (two) times daily. 180 tablet 3  . pramipexole (MIRAPEX) 0.5 MG tablet Take 0.5 mg by mouth 2 (two) times daily.     . tamsulosin (FLOMAX) 0.4 MG CAPS capsule Take 0.4 mg by mouth daily.     [name] torsemide (DEMADEX) 20 MG tablet Take 1 tablet (20 mg total) by mouth 2 (two) times daily. 60 tablet 11  . TOVIAZ 8 MG TB24 tablet Take 8 mg by mouth daily.  11   No current facility-administered medications for this visit.     Allergies:   Other    Social History:  The patient  reports that he quit smoking about 31 years ago. His smoking use included Cigarettes. He has a 40.00 pack-year smoking history. He quit smokeless tobacco use about 31 years ago. He reports that he does not drink alcohol or use drugs.   Family History:  The patient's family history includes Diabetes in his father; Heart disease in his father; Hypertension in his father; Polycythemia in his mother.    ROS:  Please see the history of present illness.    Review of Systems: Constitutional:  denies fever, chills,  diaphoresis, appetite change and fatigue.  HEENT: denies photophobia, eye pain, redness, hearing loss, ear pain, congestion, sore throat, rhinorrhea, sneezing, neck pain, neck stiffness and tinnitus.  Respiratory: admits to SOB, DOE, cough, chest tightness, and wheezing.  Cardiovascular: admits to   leg swelling.  Gastrointestinal: denies nausea, vomiting, abdominal pain, diarrhea, constipation, blood in stool.  Genitourinary: denies dysuria, urgency, frequency, hematuria, flank pain and difficulty urinating.  Musculoskeletal: denies  myalgias, back pain, joint swelling, arthralgias and gait problem.   Skin: denies pallor, rash and wound.  Neurological: denies dizziness, seizures, syncope, weakness, light-headedness, numbness and headaches.   Hematological: denies adenopathy, easy bruising, personal or family bleeding history.  Psychiatric/ Behavioral: denies suicidal ideation, mood changes, confusion, nervousness, sleep disturbance and agitation.       All other systems are reviewed and negative.     PHYSICAL EXAM: VS:  BP (!) 144/66 (BP Location: Right Arm, Patient Position: Sitting, Cuff Size: Large)   Pulse (!) 58   Ht 6\' 1"  (1.854 m)   Wt 293 lb 6.4 oz (133.1 kg)   SpO2 90%   BMI 38.71 kg/m  , BMI Body mass index is 38.71 kg/m.   Weight is up 8 lbs from previous visit .  GEN: Well nourished, well developed, in no acute distress  HEENT: normal  Neck: no JVD, carotid bruits, or masses Cardiac: RRR; no murmurs, rubs, or gallops, 1+ pitting  edema  Respiratory:  clear to auscultation bilaterally, normal work of breathing GI:  +  Abdominal fluid  MS: no deformity or atrophy  Skin: warm and dry, no rash Neuro:  Strength and sensation are intact.  He was walking with the assistance of a walker.  Gait is very slow  Psych: normal  EKG:  EKG is ordered today. The ekg ordered today demonstrates normal sinus rhythm at 62.  He has right bundle branch block.   Recent  Labs: 09/24/2016: BUN 50; Creat 1.66; Potassium 4.9; Sodium 142    Lipid Panel    Component Value Date/Time   CHOL 124 08/20/2015 1458   TRIG 82 08/20/2015 1458   HDL 29 (L) 08/20/2015 1458   CHOLHDL 4.3 08/20/2015 1458   VLDL 16 08/20/2015 1458   LDLCALC 79 08/20/2015 1458      Wt Readings from Last 3 Encounters:  01/19/17 293 lb 6.4 oz (133.1 kg)  12/09/16 291 lb (132 kg)  07/23/16 290 lb (131.5 kg)      Other studies Reviewed: Additional studies/ records that were reviewed today include: . Review of the above records demonstrates:    ASSESSMENT AND PLAN:  1. Hypertension- BP is doing well controlled   2. Hypothyroidism 3. DM- glucose levels have been well-controlled. 4. OSA 5. Mild combined systolic and diastolic CHF:   He continues to have some shortness of breath with any sort of exertion. I suspect that this is due to his deconditioning and his chronic diastolic congestive heart failure. I've advised him to stay away from any extra salt. I've advised him to increase his Demadex to 3 times a day as needed for increased swelling or increased shortness of breath. His last echocardiogram was August, 2016. We can consider repeating his echocardiogram if he shortness of breath worsens. I'll see him again in 6 months for follow-up visit.  6. Generalized weakness:  He's had generalized weakness for years.  I've asked to discuss this with his general medical doctor.  Current medicines are reviewed at length with the patient today.  The patient does not have concerns regarding medicines.    Labs/ tests ordered today include:   BMP   Signed, Mertie Moores, MD  01/19/2017 8:50 AM    [facility] [place], [place], [place]  [address] Phone: [phone]; Fax: [phone]

## 2017-01-19 NOTE — Patient Instructions (Addendum)
Medication Instructions:    Your physician recommends that you continue on your current medications as directed. Please refer to the Current Medication list given to you today.  --- If you need a refill on your cardiac medications before your next appointment, please call your pharmacy. ---  Labwork:  Today: BMET   Your physician recommends that you return for lab work in: 6 months for a BMET  Testing/Procedures:  None ordered  Follow-Up:  Your physician wants you to follow-up in: 6 months with Dr. Acie Fredrickson.  You will receive a reminder letter in the mail two months in advance. If you don't receive a letter, please call our office to schedule the follow-up appointment.  Thank you for choosing [facility]!!

## 2017-01-20 LAB — BASIC METABOLIC PANEL
BUN/Creatinine Ratio: 25 — ABNORMAL HIGH (ref 10–24)
BUN: 41 mg/dL — ABNORMAL HIGH (ref 8–27)
CALCIUM: 9 mg/dL (ref 8.6–10.2)
CHLORIDE: 102 mmol/L (ref 96–106)
CO2: 28 mmol/L (ref 18–29)
Creatinine, Ser: 1.65 mg/dL — ABNORMAL HIGH (ref 0.76–1.27)
GFR calc Af Amer: 47 mL/min/{1.73_m2} — ABNORMAL LOW (ref >59)
GFR calc non Af Amer: 41 mL/min/{1.73_m2} — ABNORMAL LOW (ref >59)
Glucose: 149 mg/dL — ABNORMAL HIGH (ref 65–99)
POTASSIUM: 5 mmol/L (ref 3.5–5.2)
Sodium: 143 mmol/L (ref 134–144)

## 2017-02-09 ENCOUNTER — Telehealth: Payer: Self-pay | Admitting: Neurology

## 2017-02-09 DIAGNOSIS — R269 Unspecified abnormalities of gait and mobility: Secondary | ICD-10-CM

## 2017-02-09 NOTE — Telephone Encounter (Signed)
Pt called says he is ready for PT. He is wanting to go to neuro rehab in our bldg and see Anderson Malta.

## 2017-02-09 NOTE — Telephone Encounter (Signed)
I have set up a referral for physical therapy

## 2017-02-09 NOTE — Addendum Note (Signed)
Addended by: Margette Fast on: 02/09/2017 06:22 PM   Modules accepted: Orders

## 2017-02-09 NOTE — Telephone Encounter (Signed)
Dr Jannifer Franklin- can you place referral please? You last saw pt on 12/09/16. Thank you!

## 2017-02-12 ENCOUNTER — Ambulatory Visit (INDEPENDENT_AMBULATORY_CARE_PROVIDER_SITE_OTHER): Payer: Medicare Other | Admitting: Cardiology

## 2017-02-12 ENCOUNTER — Encounter: Payer: Self-pay | Admitting: Cardiology

## 2017-02-12 ENCOUNTER — Telehealth: Payer: Self-pay | Admitting: Cardiovascular Disease

## 2017-02-12 ENCOUNTER — Encounter (INDEPENDENT_AMBULATORY_CARE_PROVIDER_SITE_OTHER): Payer: Self-pay

## 2017-02-12 VITALS — BP 178/80 | HR 79 | Ht 73.0 in | Wt 297.1 lb

## 2017-02-12 DIAGNOSIS — R0609 Other forms of dyspnea: Secondary | ICD-10-CM | POA: Diagnosis not present

## 2017-02-12 DIAGNOSIS — R0600 Dyspnea, unspecified: Secondary | ICD-10-CM

## 2017-02-12 MED ORDER — TORSEMIDE 20 MG PO TABS
20.0000 mg | ORAL_TABLET | Freq: Two times a day (BID) | ORAL | 11 refills | Status: DC
Start: 2017-02-12 — End: 2017-02-18

## 2017-02-12 NOTE — Progress Notes (Signed)
02/12/2017 Travis [name]   [birth year]  [id number]  Primary Physician Travis Huh, MD Primary Cardiologist: Dr. Cathie [name]    Reason for Visit/CC: Dyspnea and LEE  HPI:  [name] presents to clinic, as an add-on, given new complaint of increased exertional dyspnea and LEE.  He is followed by Dr. Acie [name]. He is the father - in -law of Travis [name]  (CCU nurse) and Travis [name] ([place]). His history is notable for chronic diastolic HF, chronic renal insufficieny,  HTN, hypothyroidism, OSA and poorly controlled type 2 DM. He is a former smoker. His last echo was 07/2015. EF was 55-60%. He had a [facility] 08/2015 which showed mild-moderate nonobstructive CAD and mild pulmonary HTN with normal LV filling pressures.   He was recently seen by Dr. Acie [name], less than 1 month ago, on 01/19/17. He mentioned mild SOB with exertion. Per note from Dr. Cathie [name], he outlined that he suspected his symptoms to be related to deconditioning as well as from chronic diastolic congestive heart failure. He advised that he refrain from salt and instructed him to increase his Demadex to 3 times daily, PRN, based on increased swelling and dyspnea. His weight in clinic that day was 293 lb. Dr. Cathie [name] outlined that he would consider repeat cardiac ultrasound if his dyspnea worsened. He instructed him to f/u in 6 months. Of note, BMP 01/19/17 showed SCr of 1.65, which appears to be close to his baseline (1.56-1.88 over the last 6 months). K was WNL at 5.0.   The patient's wife called the office earlier today stating that he has had worsening dyspnea and LEE over the last several days, despite full compliance with his diuretics. Dr. Marlou [name], DOD, was initially consulted and advised that he try adjusting his home diuretic regimen. Instructions given to increase Torsemide to 40mg  TID x 3 days and then reduce back to 20mg  BID and reduce liquids. After advise was given, patient called back requesting to be seen by a provider  today.  His office weight today is 297 lb, compared to 293 on 01/19/17. He has 2+ bilateral pitting edema, R>L. His lung exam is clear today, however he notes that his daughter-in-law listened last evening and he was noted to sound congested. He still has mild exertional dyspnea. No resting dyspnea. No orthopnea or PND. He sleeps with 1 pillow. He states he has been avoiding salt.     Current Meds  Medication Sig  . aspirin EC 81 MG EC tablet Take 1 tablet (81 mg total) by mouth daily.  . baclofen (LIORESAL) 10 MG tablet Take 1 tablet (10 mg total) by mouth 3 (three) times daily. For muscle spasms  . diclofenac (VOLTAREN) 75 MG EC tablet Take 75 mg by mouth daily.  [name] escitalopram (LEXAPRO) 10 MG tablet Take 10 mg by mouth daily.  [name] gabapentin (NEURONTIN) 400 MG capsule TAKE 1 CAPSULE (400 MG TOTAL) BY MOUTH EVERY 8 (EIGHT) HOURS.  [name] glimepiride (AMARYL) 2 MG tablet Take 2 mg by mouth daily with breakfast.  . hydrALAZINE (APRESOLINE) 25 MG tablet Take 1 tablet (25 mg total) by mouth every 8 (eight) hours.  [name] levothyroxine (SYNTHROID, LEVOTHROID) 175 MCG tablet Take 175 mcg by mouth daily.  [name] losartan (COZAAR) 50 MG tablet TAKE 1 TABLET BY MOUTH ONCE DAILY.  [name] polyethylene glycol (MIRALAX / GLYCOLAX) packet Take 17 g by mouth daily as needed. For constipation  . potassium chloride (KLOR-CON 10) 10 MEQ tablet Take 1 tablet (10 mEq total) by  mouth 2 (two) times daily.  . pramipexole (MIRAPEX) 0.5 MG tablet Take 0.5 mg by mouth 2 (two) times daily.   . tamsulosin (FLOMAX) 0.4 MG CAPS capsule Take 0.4 mg by mouth daily.   [name] torsemide (DEMADEX) 20 MG tablet Take 1 tablet (20 mg total) by mouth 2 (two) times daily. Take 40 mg twice a day for 3 days, then take normal.  . TOVIAZ 8 MG TB24 tablet Take 8 mg by mouth daily.  . [DISCONTINUED] torsemide (DEMADEX) 20 MG tablet Take 1 tablet (20 mg total) by mouth 2 (two) times daily.   Allergies  Allergen Reactions  . Other Other (See Comments)     Anesthesia makes sick, must have antinausea medication in advance.   Past Medical History:  Diagnosis Date  . Cervical myelopathy (HCC)    with myelomalacia at C5-6 and C3-4  . CHF (congestive heart failure) ([place])   . Constipation - functional    controlled with miralax  . DJD (degenerative joint disease)    WHOLE SPINE  . Gait disorder   . Heart murmur    Evaluated by cardiologist 4-5 years ago but does not currently see a cardiologist  . Hypertension   . Hyperthyroidism    Had Iodine to resolve issue DX 10 YR AGO  . Hypothyroidism    Low d/t treatment of hyperthyroidism  . 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 Peripheral neuropathy ([place])   . Pneumonia   . PONV (postoperative nausea and vomiting)   . RBBB   . Renal disorder    acute kidney failure  . Restless legs syndrome 03/14/2015  . RLS (restless legs syndrome)   . Sleep apnea    DX  2009/2010 [facility] done at [facility]  . 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 Type II diabetes mellitus (HCC)    diet controlled   Family History  Problem Relation Age of Onset  . Polycythemia Mother   . Diabetes Father   . Hypertension Father   . Heart disease Father   . CAD      1 of his 4 brothers and father has CAD   Past Surgical History:  Procedure Laterality Date  . BACK SURGERY     6 surgeries  . CARDIAC CATHETERIZATION [name] 09/07/2015   Procedure: Right/Left Heart Cath and Coronary Angiography;  Surgeon: Travis M Martinique, MD;  Location: [place] CV LAB;  Service: Cardiovascular;  Laterality: [name];  . CHOLECYSTECTOMY    . LUMBAR LAMINECTOMY/DECOMPRESSION MICRODISCECTOMY  07/14/2012   Procedure: LUMBAR LAMINECTOMY/DECOMPRESSION MICRODISCECTOMY 2 LEVELS;  Surgeon: Travis Moore, MD;  Location: [place] NEURO ORS;  Service: Neurosurgery;  Laterality: Left;  Lumbar two three laminectomy, left thoracic four five transpedicular diskectomy   Social History   Social History  . Marital status: Married    Spouse name: [name]  . Number of  children: 3  . Years of education: 12   Occupational History  .  [employer]    Works in [place] History Main Topics  . Smoking status: Former Smoker    Packs/day: 2.00    Years: 20.00    Types: Cigarettes    Quit date: 06/28/1985  . Smokeless tobacco: Former Systems developer    Quit date: 06/28/1985  . Alcohol use No  . Drug use: No  . Sexual activity: Not on file   Other Topics Concern  . Not on file   Social History Narrative   Patient is married with  3 children.   Patient has 16 yrs of education.   Patient is right handed.   Patient drinks 2 cups of caffeine per day.        Review of Systems: General: negative for chills, fever, night sweats or weight changes.  Cardiovascular: negative for chest pain, dyspnea on exertion, edema, orthopnea, palpitations, paroxysmal nocturnal dyspnea or shortness of breath Dermatological: negative for rash Respiratory: negative for cough or wheezing Urologic: negative for hematuria Abdominal: negative for nausea, vomiting, diarrhea, bright red blood per rectum, melena, or hematemesis Neurologic: negative for visual changes, syncope, or dizziness All other systems reviewed and are otherwise negative except as noted above.   Physical Exam:  Blood pressure (!) 178/80, pulse 79, height 6\' 1"  (1.854 m), weight 297 lb 1.9 oz (134.8 kg), SpO2 91 %.  General appearance: alert, cooperative and no distress Neck: no carotid bruit and no JVD Lungs: clear to auscultation bilaterally Heart: regular rate and rhythm, S1, S2 normal, no murmur, click, rub or gallop Extremities: 2+ bilateral LEE, L>R Pulses: 2+ and symmetric Skin: Skin color, texture, turgor normal. No rashes or lesions Neurologic: Grossly normal  EKG NSR   ASSESSMENT AND PLAN:   1. Acute on Chronic Diastolic Heart Failure: mildly volume overloaded by exam. 2+ bilateral LEE pitting edema, L>R. Lung exam is clear. He is only symptomatic with exertion. No resting  symptoms. We will check a BNP and BMP today. We will increase his Demadex to 40 mg BID x 3 days, then resume regular dose at 20 mg BID. Repeat BMP in 1 week to reassess renal function + K and f/u with APP for repeat assessment. Low sodium diet advised. I also recommended that elevate his legs to help with edema, while resting at home.    2. HTN: His BP is also a bit elevated. This may be due to increased volume. Increasing his diuretics should help. We can reassess BP next week during f/u and can further adjust his meds if needed.    3. Nonobstructive CAD: [facility] 08/2015 showed mild-moderate nonobstructive CAD. Details outlined below. He denies any chest pain. Continue medical therapy.    Prox RCA to Mid RCA lesion, 40% stenosed.  Ost Cx to Prox Cx lesion, 50% stenosed.  LM lesion, 40% stenosed.  The left ventricular systolic function is normal.  Dist LAD lesion, 40% stenosed.   4.  Chronic Renal Insufficiency: SCr has ranged from 1.5-1.8 over the past 6 months, given need to increase diuretics, we will recheck BMP today to check baseline baseline SCr and K. Repeat study in 7 days.    Lyda Jester [name] 02/12/2017 4:56 PM

## 2017-02-12 NOTE — Telephone Encounter (Signed)
Pt c/o Shortness Of Breath: STAT if SOB developed within the last 24 hours or pt is noticeably SOB on the phone  1. Are you currently SOB (can you hear that pt is SOB on the phone)?no  2. How long have you been experiencing SOB? Yesterday  3. Are you SOB when sitting or when up moving around? Moving around   4. Are you currently experiencing any other symptoms? no

## 2017-02-12 NOTE — Patient Instructions (Addendum)
Medication Instructions:  TAKE torsemide (demadex) to 40 mg twice a day for 3 days, then go back to taking normal dose of 20 mg twice a day.  Labwork: LABS TODAY: pro BNP, BMET  Your physician recommends that you return for lab work in: 1 week: BMET    Testing/Procedures: NONE ORDERED    Follow-Up: Your physician recommends that you schedule a follow-up appointment in: 1 week with one of the APPs.   Any Other Special Instructions Will Be Listed Below (If Applicable).     If you need a refill on your cardiac medications before your next appointment, please call your pharmacy.

## 2017-02-12 NOTE — Telephone Encounter (Signed)
Agree with the plan to have him seen today

## 2017-02-12 NOTE — Telephone Encounter (Signed)
Spoke with wife, DPR on file.  Pt started having issues with DOE yesterday.  Swelling in BLE that improved overnight with elevation.  Denies any other cardiac sx.  Denies increased salt in diet. O2 last night was 88-91%  Spoke with Dr. Marlou Porch, DOD and he said to have pt increase Torsemide to 40mg  TID x 3 days and then reduce back to 20mg  BID and reduce liquids.  Advised wife of recommendations.  Wife then tells me that her daughter is a Marine scientist and listened to pt last night and she felt he had fluid on his lungs.  Spoke with Lyda Jester, [name], as she had an opening on her schedule, and she advised that she would make same changes as Dr. Marlou Porch.  Advised wife that Brittainy recommends same changes as Dr. Marlou Porch.  Wife adamant about getting pt seen.  Scheduled pt to see Lyda Jester, [name] at 2pm.  Wife appreciative for assistance.

## 2017-02-13 LAB — PRO B NATRIURETIC PEPTIDE: NT-Pro BNP: 109 pg/mL (ref 0–376)

## 2017-02-13 LAB — BASIC METABOLIC PANEL
BUN / CREAT RATIO: 26 — AB (ref 10–24)
BUN: 37 mg/dL — ABNORMAL HIGH (ref 8–27)
CALCIUM: 9.4 mg/dL (ref 8.6–10.2)
CHLORIDE: 101 mmol/L (ref 96–106)
CO2: 24 mmol/L (ref 18–29)
Creatinine, Ser: 1.4 mg/dL — ABNORMAL HIGH (ref 0.76–1.27)
GFR calc non Af Amer: 50 mL/min/{1.73_m2} — ABNORMAL LOW (ref >59)
GFR, EST AFRICAN AMERICAN: 58 mL/min/{1.73_m2} — AB (ref >59)
Glucose: 183 mg/dL — ABNORMAL HIGH (ref 65–99)
POTASSIUM: 4.9 mmol/L (ref 3.5–5.2)
SODIUM: 144 mmol/L (ref 134–144)

## 2017-02-17 NOTE — Progress Notes (Signed)
Cardiology Office Note    Date:  02/18/2017   ID:  Travis, [name] [birth year], MRN [id number]  PCP:  Simona Huh, MD  Cardiologist: Dr. Acie Fredrickson  Chief Complaint: Dyspnea follow up  History of Present Illness:   Travis [name] is a 71 y.o. male chronic diastolic HF, chronic renal insufficieny,  HTN, hypothyroidism, OSA, former smoker and poorly controlled type 2 DM and recently worsening dyspnea presents for follow up.   He is the father - in -law of Travis [name] (CCU nurse) and Travis [name] ([place]). His last echo was 07/2015. EF was 55-60%. He had a [facility] 08/2015 which showed mild-moderate nonobstructive CAD and mild pulmonary HTN with normal LV filling pressures.   Seen by Dr. Acie Fredrickson 01/19/17 for dyspnea on exertion. Suspected his symptoms to be related to deconditioning as well as from chronic diastolic congestive heart failure. Added to APP schedule 02/12/17 for worsening dyspnea and LEE.  He gained 4lb from last OV. BNP level was normal. Increased torsemide for few days. Minimally elevated BP.  Here today for follow up. He states that his breathing has improved since last office visit. He lost 2 pounds on home scale however gained 2 pounds on our scale (297-->299lb). He is having lower extremity edema and Orthopnea. No PND, chest pain, palpitation, melena or blood in his stool or urine. States his blood pressure runs in 130/ 70s at home. He did felt better on higher dose of Torsemide.    Past Medical History:  Diagnosis Date  . Cervical myelopathy (HCC)    with myelomalacia at C5-6 and C3-4  . CHF (congestive heart failure) ([place])   . Constipation - functional    controlled with miralax  . DJD (degenerative joint disease)    WHOLE SPINE  . Gait disorder   . Heart murmur    Evaluated by cardiologist 4-5 years ago but does not currently see a cardiologist  . Hypertension   . Hyperthyroidism    Had Iodine to resolve issue DX 10 YR AGO  . Hypothyroidism    Low d/t treatment of hyperthyroidism  . 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 Peripheral neuropathy ([place])   . Pneumonia   . PONV (postoperative nausea and vomiting)   . RBBB   . Renal disorder    acute kidney failure  . Restless legs syndrome 03/14/2015  . RLS (restless legs syndrome)   . Sleep apnea    DX  2009/2010 study done at [facility]  . 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 Type II diabetes mellitus ([place])    diet controlled    Past Surgical History:  Procedure Laterality Date  . BACK SURGERY     6 surgeries  . CARDIAC CATHETERIZATION [name] 09/07/2015   Procedure: Right/Left Heart Cath and Coronary Angiography;  Surgeon: Peter M Martinique, MD;  Location: [place] CV LAB;  Service: Cardiovascular;  Laterality: [name];  . CHOLECYSTECTOMY    . LUMBAR LAMINECTOMY/DECOMPRESSION MICRODISCECTOMY  07/14/2012   Procedure: LUMBAR LAMINECTOMY/DECOMPRESSION MICRODISCECTOMY 2 LEVELS;  Surgeon: Eustace Moore, MD;  Location: [place] NEURO ORS;  Service: Neurosurgery;  Laterality: Left;  Lumbar two three laminectomy, left thoracic four five transpedicular diskectomy    Current Medications: Prior to Admission medications   Medication Sig Start Date End Date Taking? Authorizing Provider  aspirin EC 81 MG EC tablet Take 1 tablet (81 mg total) by mouth daily. 08/23/15   Thurnell Lose, MD  baclofen (LIORESAL) 10 MG tablet  Take 1 tablet (10 mg total) by mouth 3 (three) times daily. For muscle spasms 10/30/14   Meredith Staggers, MD  diclofenac (VOLTAREN) 75 MG EC tablet Take 75 mg by mouth daily. 07/14/15   Historical Provider, MD  escitalopram (LEXAPRO) 10 MG tablet Take 10 mg by mouth daily. 07/12/15   Historical Provider, MD  gabapentin (NEURONTIN) 400 MG capsule TAKE 1 CAPSULE (400 MG TOTAL) BY MOUTH EVERY 8 (EIGHT) HOURS. 04/16/15   Meredith Staggers, MD  glimepiride (AMARYL) 2 MG tablet Take 2 mg by mouth daily with breakfast.    Historical Provider, MD  hydrALAZINE (APRESOLINE) 25 MG tablet Take 1 tablet  (25 mg total) by mouth every 8 (eight) hours. 08/23/15   Thurnell Lose, MD  levothyroxine (SYNTHROID, LEVOTHROID) 175 MCG tablet Take 175 mcg by mouth daily. 08/12/15   Historical Provider, MD  losartan (COZAAR) 50 MG tablet TAKE 1 TABLET BY MOUTH ONCE DAILY. 09/30/16   [name], MD  polyethylene glycol [facility] / Floria Raveling) packet Take 17 g by mouth daily as needed. For constipation 08/09/14   Ivan Anchors Love, [name]  potassium chloride (KLOR-CON 10) 10 MEQ tablet Take 1 tablet (10 mEq total) by mouth 2 (two) times daily. 09/25/15   [name], MD  pramipexole (MIRAPEX) 0.5 MG tablet Take 0.5 mg by mouth 2 (two) times daily.  08/09/14   Bary Leriche, [name]  tamsulosin (FLOMAX) 0.4 MG CAPS capsule Take 0.4 mg by mouth daily.  09/13/14   Historical Provider, MD  torsemide (DEMADEX) 20 MG tablet Take 1 tablet (20 mg total) by mouth 2 (two) times daily. Take 40 mg twice a day for 3 days, then take normal. 02/12/17   Brittainy M Simmons, [name]  TOVIAZ 8 MG TB24 tablet Take 8 mg by mouth daily. 08/07/15   Historical Provider, MD    Allergies:   Other   Social History   Social History  . Marital status: Married    Spouse name: [name]  . Number of children: 3  . Years of education: 49   Occupational History  .  [employer]    Works in [place] History Main Topics  . Smoking status: Former Smoker    Packs/day: 2.00    Years: 20.00    Types: Cigarettes    Quit date: 06/28/1985  . Smokeless tobacco: Former Systems developer    Quit date: 06/28/1985  . Alcohol use No  . Drug use: No  . Sexual activity: Not Asked   Other Topics Concern  . None   Social History Narrative   Patient is married with 3 children.   Patient has 16 yrs of education.   Patient is right handed.   Patient drinks 2 cups of caffeine per day.        Family History:  The patient's family history includes Diabetes in his father; Heart disease in his father; Hypertension in his father; Polycythemia in  his mother.   ROS:   Please see the history of present illness.    ROS All other systems reviewed and are negative.   PHYSICAL EXAM:   VS:  BP (!) 152/78   Pulse 62   Ht 6\' 1"  (1.854 m)   Wt 299 lb (135.6 kg)   BMI 39.45 kg/m    GEN: Well nourished, well developed, in no acute distress  HEENT: normal  Neck: no JVD, carotid bruits, or masses Cardiac: RRR; no murmurs, rubs, or gallops,1-2+ pitting  edema  Respiratory:  clear to auscultation bilaterally, normal work of breathing GI: soft, nontender, nondistended, + BS MS: no deformity or atrophy  Skin: warm and dry, no rash Neuro:  Alert and Oriented x 3, Strength and sensation are intact Psych: euthymic mood, full affect  Wt Readings from Last 3 Encounters:  02/18/17 299 lb (135.6 kg)  02/12/17 297 lb 1.9 oz (134.8 kg)  01/19/17 293 lb 6.4 oz (133.1 kg)      Studies/Labs Reviewed:   EKG:  EKG is not  ordered today.   Recent Labs: 02/12/2017: BUN 37; Creatinine, Ser 1.40; NT-Pro BNP 109; Potassium 4.9; Sodium 144   Lipid Panel    Component Value Date/Time   CHOL 124 08/20/2015 1458   TRIG 82 08/20/2015 1458   HDL 29 (L) 08/20/2015 1458   CHOLHDL 4.3 08/20/2015 1458   VLDL 16 08/20/2015 1458   LDLCALC 79 08/20/2015 1458    Additional studies/ records that were reviewed today include:   Echocardiogram: 07/2015 Study Conclusions  - Left ventricle: The cavity size was normal. Wall thickness was   normal. Systolic function was normal. The estimated ejection   fraction was in the range of 55% to 60%. Wall motion was normal;   there were no regional wall motion abnormalities. Features are   consistent with a pseudonormal left ventricular filling pattern,   with concomitant abnormal relaxation and increased filling   pressure (grade 2 diastolic dysfunction). - Left atrium: The atrium was mildly dilated.  Impressions:  - Normal LV systolic function; grade 2 diastolic dysfunction; mild   LAE; trace  TR.  Right/Left Heart Cath and Coronary Angiography   08/2015  Conclusion    Prox RCA to Mid RCA lesion, 40% stenosed.  Ost Cx to Prox Cx lesion, 50% stenosed.  LM lesion, 40% stenosed.  The left ventricular systolic function is normal.  Dist LAD lesion, 40% stenosed.   1. Nonobstructive CAD 2. Normal LV function. 3. Mild pulmonary HTN with normal LV filling pressures.   Plan: medical therapy.        ASSESSMENT & PLAN:    1. Acute on chronic diastolic heart failure - He did felt better on higher dose of torsemide. His breathing is better than previously. He still has volume overload on his leg L > R however lungs clear. Discuss increasing torsemide to 30 mg twice a day however patient's wants to continue current dose of torsemide 20 mg BID. He will take addition 10mg  as needed to shortness of breath or edema. Compliant with low sodium diet. Update his echocardiogram.  2. HTN - BP of 152/78 here. However normal at home. Advised to keep log. He will bring his BP cuff to compare during his echo.   3. Non obstructive CAD - No angina. Continue current therapy.   4. Chronic Renal Insufficiency - SCr has ranged from 1.5-1.8 over the past 6 months. BMET today as previously ordered.    Medication Adjustments/Labs and Tests Ordered: Current medicines are reviewed at length with the patient today.  Concerns regarding medicines are outlined above.  Medication changes, Labs and Tests ordered today are listed in the Patient Instructions below. Patient Instructions  Medication Instructions:  Your physician recommends that you continue on your current medications as directed. Please refer to the Current Medication list given to you today.  May take additional 10 mg torsemide as needed for shortness of breath or weight gain.  Labwork: LABS TODAY: BMET  Testing/Procedures: Your physician has requested that you  have an echocardiogram. Echocardiography is a painless test that uses  sound waves to create images of your heart. It provides your doctor with information about the size and shape of your heart and how well your heart's chambers and valves are working. This procedure takes approximately one hour. There are no restrictions for this procedure.    Follow-Up: Your physician recommends that you schedule a follow-up appointment in: 2 months with Dr. Acie Fredrickson.   Any Other Special Instructions Will Be Listed Below (If Applicable).  Echocardiogram An echocardiogram, or echocardiography, uses sound waves (ultrasound) to produce an image of your heart. The echocardiogram is simple, painless, obtained within a short period of time, and offers valuable information to your health care provider. The images from an echocardiogram can provide information such as:  Evidence of coronary artery disease (CAD).  Heart size.  Heart muscle function.  Heart valve function.  Aneurysm detection.  Evidence of a past heart attack.  Fluid buildup around the heart.  Heart muscle thickening.  Assess heart valve function. Tell a health care provider about:  Any allergies you have.  All medicines you are taking, including vitamins, herbs, eye drops, creams, and over-the-counter medicines.  Any problems you or family members have had with anesthetic medicines.  Any blood disorders you have.  Any surgeries you have had.  Any medical conditions you have.  Whether you are pregnant or may be pregnant. What happens before the procedure? No special preparation is needed. Eat and drink normally. What happens during the procedure?  In order to produce an image of your heart, gel will be applied to your chest and a wand-like tool (transducer) will be moved over your chest. The gel will help transmit the sound waves from the transducer. The sound waves will harmlessly bounce off your heart to allow the heart images to be captured in real-time motion. These images will then be  recorded.  You may need an IV to receive a medicine that improves the quality of the pictures. What happens after the procedure? You may return to your normal schedule including diet, activities, and medicines, unless your health care provider tells you otherwise. This information is not intended to replace advice given to you by your health care provider. Make sure you discuss any questions you have with your health care provider. Document Released: 12/12/2000 Document Revised: 08/02/2016 Document Reviewed: 08/22/2013 Elsevier Interactive Patient Education  2017 [employer].    If you need a refill on your cardiac medications before your next appointment, please call your pharmacy.     Jarrett Soho, [place]  02/18/2017 4:10 PM    [facility] [place], [place], [place]  [address] Phone: [phone]; Fax: [phone]

## 2017-02-18 ENCOUNTER — Encounter: Payer: Self-pay | Admitting: Physician Assistant

## 2017-02-18 ENCOUNTER — Ambulatory Visit (INDEPENDENT_AMBULATORY_CARE_PROVIDER_SITE_OTHER): Payer: Medicare Other | Admitting: Physician Assistant

## 2017-02-18 ENCOUNTER — Other Ambulatory Visit (INDEPENDENT_AMBULATORY_CARE_PROVIDER_SITE_OTHER): Payer: Medicare Other

## 2017-02-18 VITALS — BP 152/78 | HR 62 | Ht 73.0 in | Wt 299.0 lb

## 2017-02-18 DIAGNOSIS — I5033 Acute on chronic diastolic (congestive) heart failure: Secondary | ICD-10-CM

## 2017-02-18 DIAGNOSIS — N183 Chronic kidney disease, stage 3 unspecified: Secondary | ICD-10-CM

## 2017-02-18 DIAGNOSIS — I10 Essential (primary) hypertension: Secondary | ICD-10-CM

## 2017-02-18 DIAGNOSIS — R0609 Other forms of dyspnea: Principal | ICD-10-CM

## 2017-02-18 DIAGNOSIS — R0600 Dyspnea, unspecified: Secondary | ICD-10-CM

## 2017-02-18 MED ORDER — TORSEMIDE 20 MG PO TABS: 20.0000 mg | ORAL_TABLET | Freq: Two times a day (BID) | ORAL | 11 refills | Status: DC

## 2017-02-18 NOTE — Patient Instructions (Addendum)
Medication Instructions:  Your physician recommends that you continue on your current medications as directed. Please refer to the Current Medication list given to you today.  May take additional 10 mg torsemide as needed for shortness of breath or weight gain.  Labwork: LABS TODAY: BMET  Testing/Procedures: Your physician has requested that you have an echocardiogram. Echocardiography is a painless test that uses sound waves to create images of your heart. It provides your doctor with information about the size and shape of your heart and how well your heart's chambers and valves are working. This procedure takes approximately one hour. There are no restrictions for this procedure.    Follow-Up: Your physician recommends that you schedule a follow-up appointment in: 2 months with Dr. Acie Fredrickson.   Any Other Special Instructions Will Be Listed Below (If Applicable).  Echocardiogram An echocardiogram, or echocardiography, uses sound waves (ultrasound) to produce an image of your heart. The echocardiogram is simple, painless, obtained within a short period of time, and offers valuable information to your health care provider. The images from an echocardiogram can provide information such as:  Evidence of coronary artery disease (CAD).  Heart size.  Heart muscle function.  Heart valve function.  Aneurysm detection.  Evidence of a past heart attack.  Fluid buildup around the heart.  Heart muscle thickening.  Assess heart valve function. Tell a health care provider about:  Any allergies you have.  All medicines you are taking, including vitamins, herbs, eye drops, creams, and over-the-counter medicines.  Any problems you or family members have had with anesthetic medicines.  Any blood disorders you have.  Any surgeries you have had.  Any medical conditions you have.  Whether you are pregnant or may be pregnant. What happens before the procedure? No special preparation is  needed. Eat and drink normally. What happens during the procedure?  In order to produce an image of your heart, gel will be applied to your chest and a wand-like tool (transducer) will be moved over your chest. The gel will help transmit the sound waves from the transducer. The sound waves will harmlessly bounce off your heart to allow the heart images to be captured in real-time motion. These images will then be recorded.  You may need an IV to receive a medicine that improves the quality of the pictures. What happens after the procedure? You may return to your normal schedule including diet, activities, and medicines, unless your health care provider tells you otherwise. This information is not intended to replace advice given to you by your health care provider. Make sure you discuss any questions you have with your health care provider. Document Released: 12/12/2000 Document Revised: 08/02/2016 Document Reviewed: 08/22/2013 Elsevier Interactive Patient Education  2017 [employer].    If you need a refill on your cardiac medications before your next appointment, please call your pharmacy.

## 2017-02-19 LAB — BASIC METABOLIC PANEL
BUN/Creatinine Ratio: 28 — ABNORMAL HIGH (ref 10–24)
BUN: 44 mg/dL — AB (ref 8–27)
CALCIUM: 9.1 mg/dL (ref 8.6–10.2)
CO2: 25 mmol/L (ref 18–29)
CREATININE: 1.6 mg/dL — AB (ref 0.76–1.27)
Chloride: 99 mmol/L (ref 96–106)
GFR, EST AFRICAN AMERICAN: 49 — AB (ref >59)
GFR, EST NON AFRICAN AMERICAN: 42 — AB (ref >59)
Glucose: 199 mg/dL — ABNORMAL HIGH (ref 65–99)
Potassium: 4.9 mmol/L (ref 3.5–5.2)
Sodium: 141 mmol/L (ref 134–144)

## 2017-02-20 ENCOUNTER — Ambulatory Visit: Payer: Medicare Other | Attending: Neurology

## 2017-02-20 DIAGNOSIS — R2689 Other abnormalities of gait and mobility: Secondary | ICD-10-CM | POA: Insufficient documentation

## 2017-02-20 DIAGNOSIS — R278 Other lack of coordination: Secondary | ICD-10-CM

## 2017-02-20 DIAGNOSIS — M6281 Muscle weakness (generalized): Secondary | ICD-10-CM | POA: Insufficient documentation

## 2017-02-20 NOTE — Therapy (Signed)
[facility] [address] [place], [place], [address] Phone: [phone]   Fax:  [phone]  Physical Therapy Evaluation  Patient Details  Name: Travis [name] MRN: [id number] Date of Birth: [birth year] Referring Provider: Dr. Jannifer Franklin  Encounter Date: 02/20/2017      PT End of Session - 02/20/17 1606    Visit Number 1   Number of Visits 17   Date for PT Re-Evaluation 04/21/17   Authorization Type G-CODE AND PROGRESS NOTE EVERY 10TH VISIT.   PT Start Time 1447   PT Stop Time 1535   PT Time Calculation (min) 48 min   Equipment Utilized During Treatment --  min guard to S for safety   Activity Tolerance Patient tolerated treatment well;Patient limited by fatigue   Behavior During Therapy [facility] for tasks assessed/performed      Past Medical History:  Diagnosis Date  . Cervical myelopathy (HCC)    with myelomalacia at C5-6 and C3-4  . CHF (congestive heart failure) ([place])   . Constipation - functional    controlled with miralax  . DJD (degenerative joint disease)    WHOLE SPINE  . Gait disorder   . Heart murmur    Evaluated by cardiologist 4-5 years ago but does not currently see a cardiologist  . Hypertension   . Hyperthyroidism    Had Iodine to resolve issue DX 10 YR AGO  . Hypothyroidism    Low d/t treatment of hyperthyroidism  . 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 Peripheral neuropathy ([place])   . Pneumonia   . PONV (postoperative nausea and vomiting)   . RBBB   . Renal disorder    acute kidney failure  . Restless legs syndrome 03/14/2015  . RLS (restless legs syndrome)   . Sleep apnea    DX  2009/2010 study done at [facility]  . 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 Type II diabetes mellitus ([place])    diet controlled    Past Surgical History:  Procedure Laterality Date  . BACK SURGERY     6 surgeries  . CARDIAC CATHETERIZATION [name] 09/07/2015   Procedure: Right/Left Heart Cath and Coronary  Angiography;  Surgeon: Peter M Martinique, MD;  Location: [facility] CV LAB;  Service: Cardiovascular;  Laterality: [name];  . CHOLECYSTECTOMY    . LUMBAR LAMINECTOMY/DECOMPRESSION MICRODISCECTOMY  07/14/2012   Procedure: LUMBAR LAMINECTOMY/DECOMPRESSION MICRODISCECTOMY 2 LEVELS;  Surgeon: Eustace Moore, MD;  Location: [facility] OR[facility];  Service: Neurosurgery;  Laterality: Left;  Lumbar two three laminectomy, left thoracic four five transpedicular diskectomy    There were no vitals filed for this visit.       Subjective Assessment - 02/20/17 1454    Subjective Pt reports he feels legs are getting weaker since he was last in PT. Pt has also been experiencing SOB and endurance is decr., he is seeing a cardiologist for CHF. Pt's reports L LE is dragging during gait and amb. with RW. He switched back to amb. with RW within the last 6 months. Pt continues to perform leg stretches and some strenthening. Pt still rides the bike occasionally and walk in driveway when it's nice outside. Pt reports L LE pain in adductor/groin area during amb. and with leg "out to the side".  Pt reports his legs feel cold at times, PT advised him to notify MD.    Patient is accompained by: Family member  Carolyn-wife   Pertinent History Cervical myelopathy and spondylosis C5-6, C3-4, tx  spine myelopathy T1-2 and T4-5, HTN, CHF, DM, DJD, hyperthyroidism, hypothyroidism, Lx spine spinal stenosis, peripheral neuropathy, R BBB, hx of back surgery   Patient Stated Goals Get rid of RW and walk with SPC.    Currently in Pain? No/denies            [facility] PT Assessment - 02/20/17 1458      Assessment   Medical Diagnosis Gait abnormality    Referring Provider Dr. Jannifer Franklin   Onset Date/Surgical Date 08/20/16  approx. last 6 months   Hand Dominance Right   Prior Therapy OPPT neuro     Precautions   Precautions Fall   Precaution Comments Based on gait speed and TUG time.     Restrictions   Weight Bearing Restrictions No      Balance Screen   Has the patient fallen in the past 6 months No  but has experienced some "close calls"   Has the patient had a decrease in activity level because of a fear of falling?  Yes   Is the patient reluctant to leave their home because of a fear of falling?  No     Home Ecologist residence   Living Arrangements Spouse/significant other   Available Help at Discharge Family   Type of [place] entrance   [place] Can reach both   [place] One level   [place] - 2 wheels;Wheelchair - manual;Walker - 4 wheels;Grab bars - tub/shower;Shower seat - built in  tripod cane     Prior Function   Level of Independence Independent with household mobility with device   Vocation Part time employment   Vocation Requirements 8am-1-2pm   Leisure yard work     Cognition   Overall Cognitive Status Within Functional Limits for tasks assessed  pt reports occasional difficulty remembering things     Sensation   Light Touch Appears Intact   Additional Comments B LE N/T starting at knee and travels distally to feet, constant.      Coordination   Gross Motor Movements are Fluid and Coordinated No   Fine Motor Movements are Fluid and Coordinated No   Coordination and Movement Description Decr. speed and range during B heel to shin 2/2 weakness. Finger to thumb coordination intact     Posture/Postural Control   Posture/Postural Control Postural limitations   Postural Limitations Increased thoracic kyphosis;Posterior pelvic tilt     Tone   Assessment Location Right Lower Extremity;Left Lower Extremity  pt reports tone is controlled with baclofen     ROM / Strength   AROM / PROM / Strength AROM;Strength     AROM   Overall AROM  Deficits   Overall AROM Comments B hip AROM limited 2/2 weakness, and L ankle DF limited 2/2 weakness.     Strength   Overall Strength Deficits   Overall Strength Comments RLE: hip  flex: 2+/5, knee ext: 4-/5, knee flex: 3+/5, ankle DF: 3+/5. LLE: hip flex: 3+/5, knee ext: 4-/5, knee flex: 3+/5, ankle DF: 2/5. B hip abd/add grossly assessed in seated position: R hip ab/ad: 4/5, L hip ab/ad: 3+/5. B hip ext weakness suspected 2/2 gait deviations but not formally assessed.     Flexibility   Soft Tissue Assessment /Muscle Length --  Decr. L hip adductor flexibility likely based on pt report     Transfers   Transfers Sit to Stand;Stand to Sit   Sit to Stand 5: Supervision;4:  Min guard;With upper extremity assist;From chair/3-in-1   Sit to Stand Details Tactile cues for weight shifting;Verbal cues for sequencing   Sit to Stand Details (indicate cue type and reason) Cues to improve ant. wt. shifting and for technique. Pt relies heavily on B UE support   Stand to Sit 5: Supervision;With upper extremity assist;To chair/3-in-1     Ambulation/Gait   Ambulation/Gait Yes   Ambulation/Gait Assistance 5: Supervision;4: Min guard   Ambulation/Gait Assistance Details Min guard to S for safety   Ambulation Distance (Feet) 100 Feet   Assistive device Rolling walker   Gait Pattern Step-through pattern;Decreased stride length;Decreased hip/knee flexion - right;Decreased hip/knee flexion - left;Decreased dorsiflexion - left;Shuffle;Trunk flexed  intermittent shuffle after PT eval   Ambulation Surface Level;Indoor   Gait velocity 1.58ft/sec.  with RW     Balance   Balance Assessed Yes     Static Standing Balance   Static Standing - Balance Support No upper extremity supported   Static Standing - Level of Assistance 5: Stand by assistance   Static Standing - Comment/# of Minutes Pt able to stand with feet apart and together,no UE support, with S for safety, for 1 minute prior to requiring seated rest break 2/2 fatigue and incr. postural sway.     Standardized Balance Assessment   Standardized Balance Assessment Timed Up and Go Test     Timed Up and Go Test   TUG Normal TUG    Normal TUG (seconds) 40.3  with RW     RLE Tone   RLE Tone Within Functional Limits     LLE Tone   LLE Tone Within Functional Limits                           PT Education - 02/20/17 1605    Education provided Yes   Education Details PT discussed POC, frequency, duration and outcome measure results.    Person(s) Educated Patient;Spouse   Methods Explanation   Comprehension Verbalized understanding          PT Short Term Goals - 02/20/17 1612      PT SHORT TERM GOAL #1   Title Pt will be independent with home exercise program to address lower extremity and core weakness and balance impairment. TARGET DATE FOR ALL STGS: 03/20/17   Status New     PT SHORT TERM GOAL #2   Title Perform BERG and write LTG as indicated.   Status New     PT SHORT TERM GOAL #3   Title Pt will amb. 400' over even terrain with RW at MOD I level to improve functional mobility.    Status New     PT SHORT TERM GOAL #4   Title Pt will improve TUG time with RW to </=25 sec. to decr. falls risk.    Status New     PT SHORT TERM GOAL #5   Title Pt will improve gait speed with RW to >/=1.46ft/sec. to decr. falls risk.    Status New     Additional Short Term Goals   Additional Short Term Goals Yes     PT SHORT TERM GOAL #6   Title Perform 6MWT and write goal as indicated.   Status New           PT Long Term Goals - 02/20/17 1615      PT LONG TERM GOAL #1   Title Pt will perform TUG in </=13.5 sec. with RW  to decr. falls risk. TARGET DATE FOR ALL LTGS: 04/17/17   Status New     PT LONG TERM GOAL #2   Title Pt will amb. 100' with SPC, with S, in order to amb. safely at home with [employer].   Status New     PT LONG TERM GOAL #3   Title Pt will improve gait speed to >/=2.8ft/sec. with RW to safely amb. in the community.    Status New     PT LONG TERM GOAL #4   Title Pt will amb. 600' over even/uneven terrain with RW, at MOD I level, to improve functional mobility.    Status  New               Plan - 02/20/17 1607    Clinical Impression Statement Pt is a pleasant 71y/o male, familiar to this clinic, presenting with gait abnormalities. Pt's PMH signficant for the following: Cervical myelopathy and spondylosis C5-6, C3-4, tx spine myelopathy T1-2 and T4-5, HTN, CHF, DM, DJD, hyperthyroidism, hypothyroidism, Lx spine spinal stenosis, peripheral neuropathy, R BBB, hx of back surgery. Pt presented with the following deficits: gait deviations, impaired strength, impaired balance, impaired coordination, B foot N/T, decreased endurance. Tone not note duirng exam, but pt reports tone is present when he does not take baclofen. Pt's gait speed and TUG time indicate pt is at risk for falls. Pt able to stand with feet apart/together for 1 minute with S but required rest 2/2 fatigue. Therefore, BERG not assessed. Pt would benefit from skilled PT to improve safety during functional mobility.    Rehab Potential Good   Clinical Impairments Affecting Rehab Potential see PMH   PT Frequency 2x / week   PT Duration 8 weeks   PT Treatment/Interventions ADLs/Self Care Home Management;Biofeedback;Electrical Stimulation;Neuromuscular re-education;Balance training;Therapeutic exercise;Therapeutic activities;Manual techniques;Functional mobility training;Stair training;Gait training;DME Instruction;Orthotic Fit/Training;Patient/family education;Vestibular   PT Next Visit Plan Perform 6MWT and write goal as indicated. Initiate balance, strength, (L hip adductor supine stretch with pillows), flexibility HEP   Consulted and Agree with Plan of Care Patient;Family member/caregiver   Family Member Consulted wife: [name]      Patient will benefit from skilled therapeutic intervention in order to improve the following deficits and impairments:  Abnormal gait, Decreased endurance, Impaired tone, Postural dysfunction, Impaired flexibility, Decreased coordination, Decreased range of motion, Decreased  balance, Decreased mobility, Decreased knowledge of use of DME, Decreased strength, Impaired sensation  Visit Diagnosis: Other abnormalities of gait and mobility - Plan: PT plan of care cert/re-cert  Muscle weakness (generalized) - Plan: PT plan of care cert/re-cert  Other lack of coordination - Plan: PT plan of care cert/re-cert      G-Codes - 54/56/25 1623    Functional Assessment Tool Used (Outpatient Only) TUG with RW: 40.3sec.; gait speed with RW: 1.5ft/sec.   Functional Limitation Mobility: Walking and moving around   Mobility: Walking and Moving Around Current Status [phone]) At least 60 percent but less than 80 percent impaired, limited or restricted   Mobility: Walking and Moving Around Goal Status [phone]) At least 20 percent but less than 40 percent impaired, limited or restricted       Problem List Patient Active Problem List   Diagnosis Date Noted  . Chronic diastolic CHF (congestive heart failure) ([place])   . Elevated troponin I level   . Acute on chronic combined systolic and diastolic CHF, NYHA class 2 ([place])   . Type II diabetes mellitus ([place]) 08/19/2015  . HCAP (healthcare-associated pneumonia) 08/19/2015  .  Hyperthyroidism 08/19/2015  . Hypothyroid 08/19/2015  . AKI (acute kidney injury) ([place]) 08/19/2015  . Blood poisoning ([place])   . Chronic combined systolic and diastolic congestive heart failure ([place]) 08/17/2015  . Restless legs syndrome 03/14/2015  . DOE (dyspnea on exertion) 03/09/2015  . Sinus bradycardia 09/21/2014  . Thoracic spondylosis with myelopathy 09/13/2014  . Acute diastolic heart failure ([place]) 07/18/2014  . Cervical arthritis with myelopathy 07/18/2014  . Constipation - functional   . CAP (community acquired pneumonia) 07/10/2014  . Sepsis ([place]) 07/10/2014  . HTN (hypertension) 07/10/2014  . OSA on CPAP 07/10/2014  . UTI (lower urinary tract infection) 07/10/2014  . Edema leg 07/10/2014  . Acute respiratory failure with hypoxia ([place])  07/10/2014  . 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 Other vitamin B12 deficiency anemia 07/13/2013  . 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 Polyneuropathy in other diseases classified elsewhere ([place]) 02/17/2013  . Other general symptoms(780.99) 02/17/2013  . Abnormality of gait 02/17/2013  . Cervical spondylosis with myelopathy 02/17/2013  . Back ache 09/15/2012    Miller,Jennifer L 02/20/2017, 4:24 PM  [facility] [address] [place], [place], [address] Phone: [phone]   Fax:  [phone]  Name: Travis [name] MRN: [id number] Date of Birth: [birth year]  Geoffry Paradise, PT,DPT 02/20/17 4:24 PM Phone: [phone] Fax: [phone]

## 2017-02-24 ENCOUNTER — Ambulatory Visit: Payer: Medicare Other | Admitting: Physical Therapy

## 2017-02-24 ENCOUNTER — Encounter: Payer: Self-pay | Admitting: Physical Therapy

## 2017-02-24 DIAGNOSIS — R2689 Other abnormalities of gait and mobility: Secondary | ICD-10-CM | POA: Diagnosis not present

## 2017-02-24 DIAGNOSIS — M6281 Muscle weakness (generalized): Secondary | ICD-10-CM

## 2017-02-24 DIAGNOSIS — R278 Other lack of coordination: Secondary | ICD-10-CM

## 2017-02-24 NOTE — Patient Instructions (Addendum)
Functional Quadriceps: Sit to Stand    Sit on edge of chair, feet flat on floor. Stand upright, extending knees fully. Come into tall posture. Slowly sit back down. Repeat __10__ times per set. Do_1_ sets per session. Do _1-2_ sessions per day.  http://orth.exer.us/734   Copyright  VHI. All rights reserved.  Straight Leg: with Bent Knee (Supine)    With right leg straight, other leg bent, raise straight leg __2-3__ inches. Hold in air for 3-5 sec's.  Repeat __10__ times each leg per set. Do _1_ sets per session. Do _1-2_ sessions per day.  http://orth.exer.us/686   Copyright  VHI. All rights reserved.  Bridging    Slowly raise buttocks from floor, keeping stomach tight. Repeat __10__ times per set. Do __1__ sets per session. Do __1-2__ sessions per day.  http://orth.exer.us/1096   Copyright  VHI. All rights reserved.   Heel Raise: Bilateral (Standing)    Holding onto something sturdy: Rise on balls of feet lifting heels up into air. Slowly lower back down Repeat _10___ times per set. Do __1__ sets per session. Do __1-2 sessions per day.  http://orth.exer.us/38   Copyright  VHI. All rights reserved.

## 2017-02-24 NOTE — Therapy (Signed)
[facility] [address] [place], [place], [address] Phone: [phone]   Fax:  [phone]  Physical Therapy Treatment  Patient Details  Name: Travis [name] MRN: [id number] Date of Birth: Oct 29, 1946 Referring Provider: Dr. Jannifer Franklin  Encounter Date: 02/24/2017      PT End of Session - 02/24/17 0848    Visit Number 2   Number of Visits 17   Date for PT Re-Evaluation 04/21/17   Authorization Type G-CODE AND PROGRESS NOTE EVERY 10TH VISIT.   PT Start Time 0802   PT Stop Time 0845   PT Time Calculation (min) 43 min   Equipment Utilized During Treatment --  min guard to S for safety   Activity Tolerance Patient tolerated treatment well;Patient limited by fatigue;No increased pain   Behavior During Therapy WFL for tasks assessed/performed      Past Medical History:  Diagnosis Date  . Cervical myelopathy (HCC)    with myelomalacia at C5-6 and C3-4  . CHF (congestive heart failure) ([place])   . Constipation - functional    controlled with miralax  . DJD (degenerative joint disease)    WHOLE SPINE  . Gait disorder   . Heart murmur    Evaluated by cardiologist 4-5 years ago but does not currently see a cardiologist  . Hypertension   . Hyperthyroidism    Had Iodine to resolve issue DX 10 YR AGO  . Hypothyroidism    Low d/t treatment of hyperthyroidism  . 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 Peripheral neuropathy ([place])   . Pneumonia   . PONV (postoperative nausea and vomiting)   . RBBB   . Renal disorder    acute kidney failure  . Restless legs syndrome 03/14/2015  . RLS (restless legs syndrome)   . Sleep apnea    DX  2009/2010 study done at [facility]  . 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 Type II diabetes mellitus ([place])    diet controlled    Past Surgical History:  Procedure Laterality Date  . BACK SURGERY     6 surgeries  . CARDIAC CATHETERIZATION [name] 09/07/2015   Procedure: Right/Left Heart Cath and  Coronary Angiography;  Surgeon: Peter M Martinique, MD;  Location: [facility] CV [facility];  Service: Cardiovascular;  Laterality: [name];  . CHOLECYSTECTOMY    . LUMBAR LAMINECTOMY/DECOMPRESSION MICRODISCECTOMY  07/14/2012   Procedure: LUMBAR LAMINECTOMY/DECOMPRESSION MICRODISCECTOMY 2 LEVELS;  Surgeon: Eustace Moore, MD;  Location: [facility];  Service: Neurosurgery;  Laterality: Left;  Lumbar two three laminectomy, left thoracic four five transpedicular diskectomy    There were no vitals filed for this visit.      Subjective Assessment - 02/24/17 2045    Subjective No new complaints. No falls or pain to report.    Pertinent History Cervical myelopathy and spondylosis C5-6, C3-4, tx spine myelopathy T1-2 and T4-5, HTN, CHF, DM, DJD, hyperthyroidism, hypothyroidism, Lx spine spinal stenosis, peripheral neuropathy, R BBB, hx of back surgery   Patient Stated Goals Get rid of RW and walk with SPC.    Currently in Pain? No/denies            [facility] PT Assessment - 02/24/17 0818      6 Minute Walk- Baseline   6 Minute Walk- Baseline yes   Modified Borg Scale for Dyspnea 0.5- Very, very slight shortness of breath   Perceived Rate of Exertion (Borg) 6-     6 Minute walk- Post Test   6  Minute Walk Post Test yes   BP (mmHg) 130/65   HR (bpm) 52   02 Sat (%RA) 89 %  on increased to >/= 90% with deep breathing while seated   Modified Borg Scale for Dyspnea 2- Mild shortness of breath   Perceived Rate of Exertion (Borg) 13- Somewhat hard     6 minute walk test results    Aerobic Endurance Distance Walked 292   Endurance additional comments with RW, no rest breaks taken.     issued the following to pt's HEP: Functional Quadriceps: Sit to Stand    Sit on edge of chair, feet flat on floor. Stand upright, extending knees fully. Come into tall posture. Slowly sit back down. Repeat __10__ times per set. Do_1_ sets per session. Do _1-2_ sessions per day.  http://orth.exer.us/734   Copyright   VHI. All rights reserved.  Straight Leg: with Bent Knee (Supine)    With right leg straight, other leg bent, raise straight leg __2-3__ inches. Hold in air for 3-5 sec's.  Repeat __10__ times each leg per set. Do _1_ sets per session. Do _1-2_ sessions per day.  http://orth.exer.us/686   Copyright  VHI. All rights reserved.  Bridging    Slowly raise buttocks from floor, keeping stomach tight. Repeat __10__ times per set. Do __1__ sets per session. Do __1-2__ sessions per day.  http://orth.exer.us/1096   Copyright  VHI. All rights reserved.   Heel Raise: Bilateral (Standing)    Holding onto something sturdy: Rise on balls of feet lifting heels up into air. Slowly lower back down Repeat _10___ times per set. Do __1__ sets per session. Do __1-2 sessions per day.  http://orth.exer.us/38   Copyright  VHI. All rights reserved.          PT Education - 02/24/17 0848    Education provided Yes   Education Details HEP for strengthening and stretching   Person(s) Educated Patient   Methods Explanation;Demonstration;Verbal cues;Handout   Comprehension Verbalized understanding;Returned demonstration;Verbal cues required;Need further instruction          PT Short Term Goals - 02/20/17 1612      PT SHORT TERM GOAL #1   Title Pt will be independent with home exercise program to address lower extremity and core weakness and balance impairment. TARGET DATE FOR ALL STGS: 03/20/17   Status New     PT SHORT TERM GOAL #2   Title Perform BERG and write LTG as indicated.   Status New     PT SHORT TERM GOAL #3   Title Pt will amb. 400' over even terrain with RW at MOD I level to improve functional mobility.    Status New     PT SHORT TERM GOAL #4   Title Pt will improve TUG time with RW to </=25 sec. to decr. falls risk.    Status New     PT SHORT TERM GOAL #5   Title Pt will improve gait speed with RW to >/=1.38ft/sec. to decr. falls risk.    Status New     Additional  Short Term Goals   Additional Short Term Goals Yes     PT SHORT TERM GOAL #6   Title Perform 6MWT and write goal as indicated.   Status New           PT Long Term Goals - 02/20/17 1615      PT LONG TERM GOAL #1   Title Pt will perform TUG in </=13.5 sec. with RW to decr. falls risk.  TARGET DATE FOR ALL LTGS: 04/17/17   Status New     PT LONG TERM GOAL #2   Title Pt will amb. 100' with SPC, with S, in order to amb. safely at home with [facility].   Status New     PT LONG TERM GOAL #3   Title Pt will improve gait speed to >/=2.68ft/sec. with RW to safely amb. in the community.    Status New     PT LONG TERM GOAL #4   Title Pt will amb. 600' over even/uneven terrain with RW, at MOD I level, to improve functional mobility.    Status New               Plan - 02/24/17 1275    Clinical Impression Statement Today's skilled session focused on establishement of baseline values for 6 minute walk test and re-establishment of HEP for strengthening/flexibility. Reviewed pt's previous episode of care and all exercise issued a that time. Pt has stopped doing all of them exept for standing heel cord stretching and heel raises at coutner top. Pt has added in wall squats on his own. This needs to be checked to ensure safety with ex. Pt is making steady progress toward goals and should benefit from continued PT to progress toward unmet goals.    Rehab Potential Good   Clinical Impairments Affecting Rehab Potential see PMH   PT Frequency 2x / week   PT Duration 8 weeks   PT Treatment/Interventions ADLs/Self Care Home Management;Biofeedback;Electrical Stimulation;Neuromuscular re-education;Balance training;Therapeutic exercise;Therapeutic activities;Manual techniques;Functional mobility training;Stair training;Gait training;DME Instruction;Orthotic Fit/Training;Patient/family education;Vestibular   PT Next Visit Plan continued to work on balance, LE strengthening and flexibility. advance HEP as  needed   Consulted and Agree with Plan of Care Patient;Family member/caregiver   Family Member Consulted wife: [name]      Patient will benefit from skilled therapeutic intervention in order to improve the following deficits and impairments:  Abnormal gait, Decreased endurance, Impaired tone, Postural dysfunction, Impaired flexibility, Decreased coordination, Decreased range of motion, Decreased balance, Decreased mobility, Decreased knowledge of use of DME, Decreased strength, Impaired sensation  Visit Diagnosis: Other abnormalities of gait and mobility  Muscle weakness (generalized)  Other lack of coordination     Problem List Patient Active Problem List   Diagnosis Date Noted  . Chronic diastolic CHF (congestive heart failure) ([place])   . Elevated troponin I level   . Acute on chronic combined systolic and diastolic CHF, NYHA class 2 ([place])   . Type II diabetes mellitus ([place]) 08/19/2015  . HCAP (healthcare-associated pneumonia) 08/19/2015  . Hyperthyroidism 08/19/2015  . Hypothyroid 08/19/2015  . AKI (acute kidney injury) ([place]) 08/19/2015  . Blood poisoning ([place])   . Chronic combined systolic and diastolic congestive heart failure ([place]) 08/17/2015  . Restless legs syndrome 03/14/2015  . DOE (dyspnea on exertion) 03/09/2015  . Sinus bradycardia 09/21/2014  . Thoracic spondylosis with myelopathy 09/13/2014  . Acute diastolic heart failure ([place]) 07/18/2014  . Cervical arthritis with myelopathy 07/18/2014  . Constipation - functional   . CAP (community acquired pneumonia) 07/10/2014  . Sepsis ([place]) 07/10/2014  . HTN (hypertension) 07/10/2014  . OSA on CPAP 07/10/2014  . UTI (lower urinary tract infection) 07/10/2014  . Edema leg 07/10/2014  . Acute respiratory failure with hypoxia ([place]) 07/10/2014  . 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 Other vitamin B12 deficiency anemia 07/13/2013  . 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 Polyneuropathy in  other diseases classified elsewhere ([place]) 02/17/2013  .  Other general symptoms(780.99) 02/17/2013  . Abnormality of gait 02/17/2013  . Cervical spondylosis with myelopathy 02/17/2013  . Back ache 09/15/2012    Willow Ora, PTA, [facility] Outpatient Neuro [facility] [address], [place], [place] [address] [phone] 02/24/17, 8:46 PM   Name: Travis [name] MRN: [id number] Date of Birth: 21-Apr-1946

## 2017-02-26 ENCOUNTER — Encounter: Payer: Self-pay | Admitting: Physical Therapy

## 2017-02-26 ENCOUNTER — Ambulatory Visit: Payer: Medicare Other | Attending: Neurology | Admitting: Physical Therapy

## 2017-02-26 DIAGNOSIS — M6281 Muscle weakness (generalized): Secondary | ICD-10-CM | POA: Diagnosis present

## 2017-02-26 DIAGNOSIS — R2689 Other abnormalities of gait and mobility: Secondary | ICD-10-CM | POA: Insufficient documentation

## 2017-02-26 DIAGNOSIS — R278 Other lack of coordination: Secondary | ICD-10-CM | POA: Insufficient documentation

## 2017-02-26 DIAGNOSIS — R269 Unspecified abnormalities of gait and mobility: Secondary | ICD-10-CM | POA: Diagnosis present

## 2017-02-26 NOTE — Therapy (Signed)
[facility] [address] [place], [place], [address] Phone: [phone]   Fax:  [phone]  Physical Therapy Treatment  Patient Details  Name: Travis [name] MRN: [id number] Date of Birth: July 08, 1946 Referring Provider: Dr. Jannifer Franklin  Encounter Date: 02/26/2017      PT End of Session - 02/26/17 0850    Visit Number 3   Number of Visits 17   Date for PT Re-Evaluation 04/21/17   Authorization Type G-CODE AND PROGRESS NOTE EVERY 10TH VISIT.   PT Start Time 0848   PT Stop Time 0930   PT Time Calculation (min) 42 min   Equipment Utilized During Treatment --  min guard to S for safety   Activity Tolerance Patient tolerated treatment well;Patient limited by fatigue;No increased pain   Behavior During Therapy WFL for tasks assessed/performed      Past Medical History:  Diagnosis Date  . Cervical myelopathy (HCC)    with myelomalacia at C5-6 and C3-4  . CHF (congestive heart failure) ([place])   . Constipation - functional    controlled with miralax  . DJD (degenerative joint disease)    WHOLE SPINE  . Gait disorder   . Heart murmur    Evaluated by cardiologist 4-5 years ago but does not currently see a cardiologist  . Hypertension   . Hyperthyroidism    Had Iodine to resolve issue DX 10 YR AGO  . Hypothyroidism    Low d/t treatment of hyperthyroidism  . 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 Peripheral neuropathy ([place])   . Pneumonia   . PONV (postoperative nausea and vomiting)   . RBBB   . Renal disorder    acute kidney failure  . Restless legs syndrome 03/14/2015  . RLS (restless legs syndrome)   . Sleep apnea    DX  2009/2010 study done at [facility]  . 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 Type II diabetes mellitus ([place])    diet controlled    Past Surgical History:  Procedure Laterality Date  . BACK SURGERY     6 surgeries  . CARDIAC CATHETERIZATION [name] 09/07/2015   Procedure: Right/Left Heart Cath and  Coronary Angiography;  Surgeon: Peter M Martinique, MD;  Location: [facility];  Service: Cardiovascular;  Laterality: [name];  . CHOLECYSTECTOMY    . LUMBAR LAMINECTOMY/DECOMPRESSION MICRODISCECTOMY  07/14/2012   Procedure: LUMBAR LAMINECTOMY/DECOMPRESSION MICRODISCECTOMY 2 LEVELS;  Surgeon: Eustace Moore, MD;  Location: [facility];  Service: Neurosurgery;  Laterality: Left;  Lumbar two three laminectomy, left thoracic four five transpedicular diskectomy    There were no vitals filed for this visit.      Subjective Assessment - 02/26/17 0850    Subjective No new complaints. No falls or pain to report. Did his HEP 3x yesterday, tired today.    Pertinent History Cervical myelopathy and spondylosis C5-6, C3-4, tx spine myelopathy T1-2 and T4-5, HTN, CHF, DM, DJD, hyperthyroidism, hypothyroidism, Lx spine spinal stenosis, peripheral neuropathy, R BBB, hx of back surgery   Patient Stated Goals Get rid of RW and walk with SPC.    Currently in Pain? No/denies           [facility] Adult PT Treatment/Exercise - 02/26/17 0856      Lumbar Exercises: Supine   Bent Knee Raise 10 reps;Limitations   Bent Knee Raise Limitations cues for abdominal bracing and ex form, cues to breathe with exercises   Bridge Non-compliant;10 reps;3 seconds;Limitations   Bridge Limitations  cues on hold times and ex form/technique   Straight Leg Raise 10 reps;3 seconds;Limitations   Straight Leg Raises Limitations bil legs with ~3 sec holds each rep, right>left min AA for controlled movements   Other Supine Lumbar Exercises lower trunk rotation x 10 reps each side with 5-10 sec holds     Knee/Hip Exercises: Standing   Heel Raises Both;1 set;10 reps;Limitations   Heel Raises Limitations cues on posture with UE support   Functional Squat 1 set;10 reps;Limitations   Functional Squat Limitations mini squat with UE support, cues on form and technique     Knee/Hip Exercises: Seated   Long Arc Quad AROM;Strengthening;Both;1  set;10 reps;Limitations   Long Arc Quad Limitations no holds,cues on maintaining upright posture, and to for slow, controlled lowering of foot back to ground   Other Seated Knee/Hip Exercises with green theraband: assisted DF, then pt pushing foot down for resisted PF with emphasis then on controlled, slow return into assisted DF. x10 reps each leg,      Knee/Hip Exercises: Supine   Short Arc Quad Sets AROM;Strengthening;Both;1 set;10 reps;Limitations   Short Arc Quad Sets Limitations 5 sec holds with cues on form and technique. had blue bolster under pt's legs with this exercise.   Other Supine Knee/Hip Exercises hooklying with green band around legs just above knees: hip abduct/ER to pull band, 5 sec hold and then slowly returning knees back together. 10 reps with cues on form and technique x 2 sets performed            PT Short Term Goals - 02/26/17 2831      PT SHORT TERM GOAL #1   Title Pt will be independent with home exercise program to address lower extremity and core weakness and balance impairment. TARGET DATE FOR ALL STGS: 03/20/17   Status New     PT SHORT TERM GOAL #2   Title Perform BERG and write LTG as indicated.   Status New     PT SHORT TERM GOAL #3   Title Pt will amb. 400' over even terrain with RW at MOD I level to improve functional mobility.    Status New     PT SHORT TERM GOAL #4   Title Pt will improve TUG time with RW to </=25 sec. to decr. falls risk.    Status New     PT SHORT TERM GOAL #5   Title Pt will improve gait speed with RW to >/=1.46ft/sec. to decr. falls risk.    Status New     PT SHORT TERM GOAL #6   Title Pt will improve 6MWT distance  by 75' from baseline in order to indicate improved functional endurance.     Baseline 292' on 02/24/17   Status Revised           PT Long Term Goals - 02/26/17 5176      PT LONG TERM GOAL #1   Title Pt will perform TUG in </=13.5 sec. with RW to decr. falls risk. TARGET DATE FOR ALL LTGS: 04/17/17    Status New     PT LONG TERM GOAL #2   Title Pt will amb. 100' with SPC, with S, in order to amb. safely at home with [facility].   Status New     PT LONG TERM GOAL #3   Title Pt will improve gait speed to >/=2.17ft/sec. with RW to safely amb. in the community.    Status New     PT LONG TERM  GOAL #4   Title Pt will amb. 600' over even/uneven terrain with RW, at MOD I level, to improve functional mobility.    Status New     PT LONG TERM GOAL #5   Title Pt will improve 6MWT distance by 150' from baseline in order to indicate improved functional endurance.     Status New            Plan - 02/26/17 9937    Clinical Impression Statement todays skilled session continued to focus on LE strengthening with pt needing short rest breaks due to fatigue. Pt is making slow, steady progress toward goals and should benefit from continued PT to progress toward unmet goals.    Rehab Potential Good   Clinical Impairments Affecting Rehab Potential see PMH   PT Frequency 2x / week   PT Duration 8 weeks   PT Treatment/Interventions ADLs/Self Care Home Management;Biofeedback;Electrical Stimulation;Neuromuscular re-education;Balance training;Therapeutic exercise;Therapeutic activities;Manual techniques;Functional mobility training;Stair training;Gait training;DME Instruction;Orthotic Fit/Training;Patient/family education;Vestibular   PT Next Visit Plan continued to work on balance, LE strengthening and flexibility. advance HEP as needed   Consulted and Agree with Plan of Care Patient;Family member/caregiver   Family Member Consulted wife: [name]      Patient will benefit from skilled therapeutic intervention in order to improve the following deficits and impairments:  Abnormal gait, Decreased endurance, Impaired tone, Postural dysfunction, Impaired flexibility, Decreased coordination, Decreased range of motion, Decreased balance, Decreased mobility, Decreased knowledge of use of DME, Decreased strength,  Impaired sensation  Visit Diagnosis: Other abnormalities of gait and mobility  Muscle weakness (generalized)  Other lack of coordination     Problem List Patient Active Problem List   Diagnosis Date Noted  . Chronic diastolic CHF (congestive heart failure) ([place])   . Elevated troponin I level   . Acute on chronic combined systolic and diastolic CHF, NYHA class 2 ([place])   . Type II diabetes mellitus ([place]) 08/19/2015  . HCAP (healthcare-associated pneumonia) 08/19/2015  . Hyperthyroidism 08/19/2015  . Hypothyroid 08/19/2015  . AKI (acute kidney injury) ([place]) 08/19/2015  . Blood poisoning ([place])   . Chronic combined systolic and diastolic congestive heart failure ([place]) 08/17/2015  . Restless legs syndrome 03/14/2015  . DOE (dyspnea on exertion) 03/09/2015  . Sinus bradycardia 09/21/2014  . Thoracic spondylosis with myelopathy 09/13/2014  . Acute diastolic heart failure ([place]) 07/18/2014  . Cervical arthritis with myelopathy 07/18/2014  . Constipation - functional   . CAP (community acquired pneumonia) 07/10/2014  . Sepsis ([place]) 07/10/2014  . HTN (hypertension) 07/10/2014  . OSA on CPAP 07/10/2014  . UTI (lower urinary tract infection) 07/10/2014  . Edema leg 07/10/2014  . Acute respiratory failure with hypoxia ([place]) 07/10/2014  . 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 Other vitamin B12 deficiency anemia 07/13/2013  . 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 Polyneuropathy in other diseases classified elsewhere ([place]) 02/17/2013  . Other general symptoms(780.99) 02/17/2013  . Abnormality of gait 02/17/2013  . Cervical spondylosis with myelopathy 02/17/2013  . Back ache 09/15/2012    Willow Ora, PTA, [facility] Outpatient Neuro [facility] [address] [place], [place] [address] [phone] 02/26/17, 1:05 PM   Name: Travis [name] MRN: [id number] Date of Birth: 1946/01/29

## 2017-03-02 ENCOUNTER — Telehealth: Payer: Self-pay | Admitting: Physician Assistant

## 2017-03-02 ENCOUNTER — Encounter: Payer: Self-pay | Admitting: *Deleted

## 2017-03-02 NOTE — Telephone Encounter (Signed)
Follow UP   [name] is returning your call. Thanks

## 2017-03-02 NOTE — Telephone Encounter (Signed)
New message ° ° ° ° °Returning a call to the nurse to get lab results °

## 2017-03-03 ENCOUNTER — Encounter: Payer: Self-pay | Admitting: Physical Therapy

## 2017-03-03 ENCOUNTER — Ambulatory Visit: Payer: Medicare Other | Admitting: Physical Therapy

## 2017-03-03 DIAGNOSIS — R2689 Other abnormalities of gait and mobility: Secondary | ICD-10-CM

## 2017-03-03 DIAGNOSIS — M6281 Muscle weakness (generalized): Secondary | ICD-10-CM

## 2017-03-04 NOTE — Therapy (Signed)
[facility] [address] [place], [place], [address] Phone: [phone]   Fax:  [phone]  Physical Therapy Treatment  Patient Details  Name: Travis [name] MRN: [id number] Date of Birth: 1946-10-01 Referring Provider: Dr. Jannifer Franklin  Encounter Date: 03/03/2017      PT End of Session - 03/03/17 0859    Visit Number 4   Number of Visits 17   Date for PT Re-Evaluation 04/21/17   Authorization Type G-CODE AND PROGRESS NOTE EVERY 10TH VISIT.   PT Start Time [phone]   PT Stop Time 0930   PT Time Calculation (min) 44 min   Equipment Utilized During Treatment Gait belt   Activity Tolerance Patient tolerated treatment well;Patient limited by fatigue;No increased pain   Behavior During Therapy WFL for tasks assessed/performed      Past Medical History:  Diagnosis Date  . Cervical myelopathy (HCC)    with myelomalacia at C5-6 and C3-4  . CHF (congestive heart failure) ([place])   . Constipation - functional    controlled with miralax  . DJD (degenerative joint disease)    WHOLE SPINE  . Gait disorder   . Heart murmur    Evaluated by cardiologist 4-5 years ago but does not currently see a cardiologist  . Hypertension   . Hyperthyroidism    Had Iodine to resolve issue DX 10 YR AGO  . Hypothyroidism    Low d/t treatment of hyperthyroidism  . 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 Peripheral neuropathy ([place])   . Pneumonia   . PONV (postoperative nausea and vomiting)   . RBBB   . Renal disorder    acute kidney failure  . Restless legs syndrome 03/14/2015  . RLS (restless legs syndrome)   . Sleep apnea    DX  2009/2010 study done at [facility]  . 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 Type II diabetes mellitus ([place])    diet controlled    Past Surgical History:  Procedure Laterality Date  . BACK SURGERY     6 surgeries  . CARDIAC CATHETERIZATION [name] 09/07/2015   Procedure: Right/Left Heart Cath and Coronary Angiography;   Surgeon: Peter M Martinique, MD;  Location: [facility];  Service: Cardiovascular;  Laterality: [name];  . CHOLECYSTECTOMY    . LUMBAR LAMINECTOMY/DECOMPRESSION MICRODISCECTOMY  07/14/2012   Procedure: LUMBAR LAMINECTOMY/DECOMPRESSION MICRODISCECTOMY 2 LEVELS;  Surgeon: Eustace Moore, MD;  Location: [facility] OR[facility];  Service: Neurosurgery;  Laterality: Left;  Lumbar two three laminectomy, left thoracic four five transpedicular diskectomy    There were no vitals filed for this visit.      Subjective Assessment - 03/03/17 0859    Subjective No new complaints. No falls or pain to report.  Was tired after last session, no soreness or pain.   Pertinent History Cervical myelopathy and spondylosis C5-6, C3-4, tx spine myelopathy T1-2 and T4-5, HTN, CHF, DM, DJD, hyperthyroidism, hypothyroidism, Lx spine spinal stenosis, peripheral neuropathy, R BBB, hx of back surgery   Patient Stated Goals Get rid of RW and walk with SPC.    Currently in Pain? No/denies             [facility] Adult PT Treatment/Exercise - 03/03/17 0901      Lumbar Exercises: Supine   Bent Knee Raise 10 reps;Limitations   Bent Knee Raise Limitations cues for abdominal bracing and ex form, cues to breathe with exercises   Bridge Non-compliant;10 reps;3 seconds;Limitations   Bridge Limitations cues on  hold times and ex form/technique   Straight Leg Raise 10 reps;3 seconds;Limitations   Straight Leg Raises Limitations bil legs with ~3 sec holds each rep, right>left min AA for controlled movements   Other Supine Lumbar Exercises lower trunk rotation x 10 reps each side with 5-10 sec holds, red ball under legs to assist with movements.      Knee/Hip Exercises: Standing   Heel Raises Both;1 set;10 reps;Limitations   Heel Raises Limitations cues on posture with UE support   Hip Abduction AROM;Stengthening;Both;1 set;10 reps;Knee straight;Limitations   Abduction Limitations with UE support: alternating legs with cues on posture and ex  form/technique.   Functional Squat 1 set;10 reps;Limitations   Functional Squat Limitations mini squat with UE support, cues on form and technique     Knee/Hip Exercises: Seated   Long Arc Quad AROM;Strengthening;Both;1 set;10 reps;Limitations   Long Arc Quad Limitations 3 sec holds each rep with cues on posture and ex form/technique.    Other Seated Knee/Hip Exercises with green theraband: assisted DF, then pt pushing foot down for resisted PF with emphasis then on controlled, slow return into assisted DF. 2 sets of 10 reps each leg,    Sit to Sand 1 set;10 reps;Other (comment)     Knee/Hip Exercises: Supine   Short Arc Quad Sets AROM;Strengthening;Both;1 set;10 reps;Limitations   Other Supine Knee/Hip Exercises hooklying with green band around legs just above knees: hip abduct/ER to pull band, 5 sec hold and then slowly returning knees back together. 10 reps with cues on form and technique.             PT Short Term Goals - 02/26/17 9381      PT SHORT TERM GOAL #1   Title Pt will be independent with home exercise program to address lower extremity and core weakness and balance impairment. TARGET DATE FOR ALL STGS: 03/20/17   Status New     PT SHORT TERM GOAL #2   Title Perform BERG and write LTG as indicated.   Status New     PT SHORT TERM GOAL #3   Title Pt will amb. 400' over even terrain with RW at MOD I level to improve functional mobility.    Status New     PT SHORT TERM GOAL #4   Title Pt will improve TUG time with RW to </=25 sec. to decr. falls risk.    Status New     PT SHORT TERM GOAL #5   Title Pt will improve gait speed with RW to >/=1.70ft/sec. to decr. falls risk.    Status New     PT SHORT TERM GOAL #6   Title Pt will improve 6MWT distance  by 75' from baseline in order to indicate improved functional endurance.     Baseline 292' on 02/24/17   Status Revised           PT Long Term Goals - 02/26/17 8299      PT LONG TERM GOAL #1   Title Pt will  perform TUG in </=13.5 sec. with RW to decr. falls risk. TARGET DATE FOR ALL LTGS: 04/17/17   Status New     PT LONG TERM GOAL #2   Title Pt will amb. 100' with SPC, with S, in order to amb. safely at home with [facility].   Status New     PT LONG TERM GOAL #3   Title Pt will improve gait speed to >/=2.61ft/sec. with RW to safely amb. in the community.  Status New     PT LONG TERM GOAL #4   Title Pt will amb. 600' over even/uneven terrain with RW, at MOD I level, to improve functional mobility.    Status New     PT LONG TERM GOAL #5   Title Pt will improve 6MWT distance by 150' from baseline in order to indicate improved functional endurance.     Status New            Plan - 03/03/17 0859    Clinical Impression Statement Today's skilled session continued to focus on LE strengthening for improved transfer's and gait. Pt continued to fatigue quickly needing short rest breaks. Pt is motivated to get better and is making slow, steady progress toward goals. Pt should benefit from continued PT to progress toward unmet goals.    Rehab Potential Good   Clinical Impairments Affecting Rehab Potential see PMH   PT Frequency 2x / week   PT Duration 8 weeks   PT Treatment/Interventions ADLs/Self Care Home Management;Biofeedback;Electrical Stimulation;Neuromuscular re-education;Balance training;Therapeutic exercise;Therapeutic activities;Manual techniques;Functional mobility training;Stair training;Gait training;DME Instruction;Orthotic Fit/Training;Patient/family education;Vestibular   PT Next Visit Plan continued to work on balance, LE strengthening and flexibility. advance HEP as needed   Consulted and Agree with Plan of Care Patient;Family member/caregiver   Family Member Consulted wife: [name]      Patient will benefit from skilled therapeutic intervention in order to improve the following deficits and impairments:  Abnormal gait, Decreased endurance, Impaired tone, Postural dysfunction,  Impaired flexibility, Decreased coordination, Decreased range of motion, Decreased balance, Decreased mobility, Decreased knowledge of use of DME, Decreased strength, Impaired sensation  Visit Diagnosis: Other abnormalities of gait and mobility  Muscle weakness (generalized)     Problem List Patient Active Problem List   Diagnosis Date Noted  . Chronic diastolic CHF (congestive heart failure) ([place])   . Elevated troponin I level   . Acute on chronic combined systolic and diastolic CHF, NYHA class 2 ([place])   . Type II diabetes mellitus ([place]) 08/19/2015  . HCAP (healthcare-associated pneumonia) 08/19/2015  . Hyperthyroidism 08/19/2015  . Hypothyroid 08/19/2015  . AKI (acute kidney injury) ([place]) 08/19/2015  . Blood poisoning ([place])   . Chronic combined systolic and diastolic congestive heart failure ([place]) 08/17/2015  . Restless legs syndrome 03/14/2015  . DOE (dyspnea on exertion) 03/09/2015  . Sinus bradycardia 09/21/2014  . Thoracic spondylosis with myelopathy 09/13/2014  . Acute diastolic heart failure ([place]) 07/18/2014  . Cervical arthritis with myelopathy 07/18/2014  . Constipation - functional   . CAP (community acquired pneumonia) 07/10/2014  . Sepsis ([place]) 07/10/2014  . HTN (hypertension) 07/10/2014  . OSA on CPAP 07/10/2014  . UTI (lower urinary tract infection) 07/10/2014  . Edema leg 07/10/2014  . Acute respiratory failure with hypoxia ([place]) 07/10/2014  . 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 Other vitamin B12 deficiency anemia 07/13/2013  . 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 Polyneuropathy in other diseases classified elsewhere ([place]) 02/17/2013  . Other general symptoms(780.99) 02/17/2013  . Abnormality of gait 02/17/2013  . Cervical spondylosis with myelopathy 02/17/2013  . Back ache 09/15/2012    Willow Ora, PTA, [facility]patient Neuro [facility] [address], [place], [place] [address] [phone] 03/04/17, 9:35 AM    Name: DAILAN PFALZGRAF MRN: [id number] Date of Birth: Dec 27, 1946

## 2017-03-04 NOTE — Telephone Encounter (Signed)
Returned pts call.  Spoke with wife, [name], [place] on file.  She has been made aware of pts lab results.

## 2017-03-05 ENCOUNTER — Ambulatory Visit: Payer: Medicare Other | Admitting: Physical Therapy

## 2017-03-05 ENCOUNTER — Encounter: Payer: Self-pay | Admitting: Physical Therapy

## 2017-03-05 DIAGNOSIS — R2689 Other abnormalities of gait and mobility: Secondary | ICD-10-CM

## 2017-03-05 DIAGNOSIS — M6281 Muscle weakness (generalized): Secondary | ICD-10-CM

## 2017-03-05 DIAGNOSIS — R278 Other lack of coordination: Secondary | ICD-10-CM

## 2017-03-06 ENCOUNTER — Other Ambulatory Visit: Payer: Self-pay

## 2017-03-06 ENCOUNTER — Ambulatory Visit (HOSPITAL_COMMUNITY): Payer: Medicare Other | Attending: Internal Medicine

## 2017-03-06 DIAGNOSIS — I5033 Acute on chronic diastolic (congestive) heart failure: Secondary | ICD-10-CM

## 2017-03-06 DIAGNOSIS — I5031 Acute diastolic (congestive) heart failure: Secondary | ICD-10-CM | POA: Insufficient documentation

## 2017-03-06 DIAGNOSIS — G4730 Sleep apnea, unspecified: Secondary | ICD-10-CM | POA: Insufficient documentation

## 2017-03-06 DIAGNOSIS — I371 Nonrheumatic pulmonary valve insufficiency: Secondary | ICD-10-CM | POA: Diagnosis not present

## 2017-03-06 MED ORDER — PERFLUTREN LIPID MICROSPHERE
1.0000 mL | INTRAVENOUS | Status: AC | PRN
  Administered 2017-03-06: 2 mL via INTRAVENOUS

## 2017-03-06 NOTE — Therapy (Signed)
[facility] [address] [place], [place], [address] Phone: [phone]   Fax:  [phone]  Physical Therapy Treatment  Patient Details  Name: Travis [name] MRN: [id number] Date of Birth: [birth year] Referring Provider: Dr. Jannifer Franklin  Encounter Date: 03/05/2017   03/05/17 0855  PT Visits / Re-Eval  Visit Number 5  Number of Visits 17  Date for PT Re-Evaluation 04/21/17  Authorization  Authorization Type G-CODE AND PROGRESS NOTE EVERY 10TH VISIT.  PT Time Calculation  PT Start Time [phone]  PT Stop Time 0930  PT Time Calculation (min) 43 min  PT - End of Session  Equipment Utilized During Treatment Gait belt  Activity Tolerance Patient tolerated treatment well;Patient limited by fatigue;No increased pain  Behavior During Therapy WFL for tasks assessed/performed     Past Medical History:  Diagnosis Date  . Cervical myelopathy (HCC)    with myelomalacia at C5-6 and C3-4  . CHF (congestive heart failure) ([place])   . Constipation - functional    controlled with miralax  . DJD (degenerative joint disease)    WHOLE SPINE  . Gait disorder   . Heart murmur    Evaluated by cardiologist 4-5 years ago but does not currently see a cardiologist  . Hypertension   . Hyperthyroidism    Had Iodine to resolve issue DX 10 YR AGO  . Hypothyroidism    Low d/t treatment of hyperthyroidism  . 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 Peripheral neuropathy ([place])   . Pneumonia   . PONV (postoperative nausea and vomiting)   . RBBB   . Renal disorder    acute kidney failure  . Restless legs syndrome 03/14/2015  . RLS (restless legs syndrome)   . Sleep apnea    DX  2009/2010 study done at [facility]  . 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 Type II diabetes mellitus ([place])    diet controlled    Past Surgical History:  Procedure Laterality Date  . BACK SURGERY     6 surgeries  . CARDIAC CATHETERIZATION [name] 09/07/2015   Procedure:  Right/Left Heart Cath and Coronary Angiography;  Surgeon: Peter M Martinique, MD;  Location: [facility];  Service: Cardiovascular;  Laterality: [name];  . CHOLECYSTECTOMY    . LUMBAR LAMINECTOMY/DECOMPRESSION MICRODISCECTOMY  07/14/2012   Procedure: LUMBAR LAMINECTOMY/DECOMPRESSION MICRODISCECTOMY 2 LEVELS;  Surgeon: Eustace Moore, MD;  Location: [place] NEURO ORS;  Service: Neurosurgery;  Laterality: Left;  Lumbar two three laminectomy, left thoracic four five transpedicular diskectomy    There were no vitals filed for this visit.     03/05/17 0854  Symptoms/Limitations  Subjective No new complaints. No falls or pain to report. No pain after last session, just fatigue  Pertinent History Cervical myelopathy and spondylosis C5-6, C3-4, tx spine myelopathy T1-2 and T4-5, HTN, CHF, DM, DJD, hyperthyroidism, hypothyroidism, Lx spine spinal stenosis, peripheral neuropathy, R BBB, hx of back surgery  Patient Stated Goals Get rid of RW and walk with SPC.   Pain Assessment  Currently in Pain? No/denies      03/05/17 0857  Ambulation/Gait  Ambulation/Gait Yes  Ambulation/Gait Assistance 4: Min assist;3: Mod assist  Ambulation/Gait Assistance Details min assist with gait at counter top: cues on posture, sequencing and cane placement   Ambulation Distance (Feet) (3 laps down/back at counter top)  Assistive device Straight cane (with tripod tip)  Gait Pattern Step-through pattern;Decreased stride length;Decreased hip/knee flexion - right;Decreased hip/knee flexion - left;Decreased dorsiflexion -  left;Shuffle;Trunk flexed  Ambulation Surface Level;Indoor  High Level Balance  High Level Balance Activities Side stepping;Marching forwards  High Level Balance Comments at counter top with UE support: 3 laps each way with side steppping with cues on posture and ex form/technique; fwd marching with single UE support on counter x 4 laps with min assist and cues on posture/form/technique;              Knee/Hip  Exercises: Standing  Heel Raises Both;1 set;10 reps;Limitations  Heel Raises Limitations cues on posture with UE support  Hip Abduction AROM;Stengthening;Both;1 set;10 reps;Knee straight;Limitations  Abduction Limitations with UE support: alternating legs with cues on posture and ex form/technique.  Functional Squat 1 set;10 reps;Limitations  Functional Squat Limitations mini squat with UE support, cues on form and technique  Knee/Hip Exercises: Seated  Long Arc Quad AROM;Strengthening;Both;1 set;10 reps;Limitations  Long Arc Quad Limitations 3 sec holds each rep with cues on posture and ex form/technique.   Other Seated Knee/Hip Exercises with green theraband: assisted DF, then pt pushing foot down for resisted PF with emphasis then on controlled, slow return into assisted DF. 2 sets of 10 reps each leg,   Sit to Sand 1 set;10 reps;Other (comment) (UE support from elevated mat table, supervision/min guard A)          PT Short Term Goals - 02/26/17 0355      PT SHORT TERM GOAL #1   Title Pt will be independent with home exercise program to address lower extremity and core weakness and balance impairment. TARGET DATE FOR ALL STGS: 03/20/17   Status New     PT SHORT TERM GOAL #2   Title Perform BERG and write LTG as indicated.   Status New     PT SHORT TERM GOAL #3   Title Pt will amb. 400' over even terrain with RW at MOD I level to improve functional mobility.    Status New     PT SHORT TERM GOAL #4   Title Pt will improve TUG time with RW to </=25 sec. to decr. falls risk.    Status New     PT SHORT TERM GOAL #5   Title Pt will improve gait speed with RW to >/=1.23ft/sec. to decr. falls risk.    Status New     PT SHORT TERM GOAL #6   Title Pt will improve 6MWT distance  by 75' from baseline in order to indicate improved functional endurance.     Baseline 292' on 02/24/17   Status Revised           PT Long Term Goals - 02/26/17 9741      PT LONG TERM GOAL #1   Title  Pt will perform TUG in </=13.5 sec. with RW to decr. falls risk. TARGET DATE FOR ALL LTGS: 04/17/17   Status New     PT LONG TERM GOAL #2   Title Pt will amb. 100' with SPC, with S, in order to amb. safely at home with [facility].   Status New     PT LONG TERM GOAL #3   Title Pt will improve gait speed to >/=2.7ft/sec. with RW to safely amb. in the community.    Status New     PT LONG TERM GOAL #4   Title Pt will amb. 600' over even/uneven terrain with RW, at MOD I level, to improve functional mobility.    Status New     PT LONG TERM GOAL #5   Title Pt  will improve 6MWT distance by 150' from baseline in order to indicate improved functional endurance.     Status New        03/05/17 0856  Plan  Clinical Impression Statement Today's skilled session continued to focus on LE strengthening, balance and began to advance gait to using a straight cane vs RW. Pt continues to fatigue quickly, however is motivated to work hard. Pt is making progress toward goals and should benefit from continued PT to progress toward unmet goals.  Pt will benefit from skilled therapeutic intervention in order to improve on the following deficits Abnormal gait;Decreased endurance;Impaired tone;Postural dysfunction;Impaired flexibility;Decreased coordination;Decreased range of motion;Decreased balance;Decreased mobility;Decreased knowledge of use of DME;Decreased strength;Impaired sensation  Rehab Potential Good  Clinical Impairments Affecting Rehab Potential see PMH  PT Frequency 2x / week  PT Duration 8 weeks  PT Treatment/Interventions ADLs/Self Care Home Management;Biofeedback;Electrical Stimulation;Neuromuscular re-education;Balance training;Therapeutic exercise;Therapeutic activities;Manual techniques;Functional mobility training;Stair training;Gait training;DME Instruction;Orthotic Fit/Training;Patient/family education;Vestibular  PT Next Visit Plan continued to work on balance, LE strengthening and flexibility.  advance HEP as needed  Consulted and Agree with Plan of Care Patient;Family member/caregiver  Family Member Consulted wife: [name]       Patient will benefit from skilled therapeutic intervention in order to improve the following deficits and impairments:  Abnormal gait, Decreased endurance, Impaired tone, Postural dysfunction, Impaired flexibility, Decreased coordination, Decreased range of motion, Decreased balance, Decreased mobility, Decreased knowledge of use of DME, Decreased strength, Impaired sensation  Visit Diagnosis: Other abnormalities of gait and mobility  Muscle weakness (generalized)  Other lack of coordination     Problem List Patient Active Problem List   Diagnosis Date Noted  . Chronic diastolic CHF (congestive heart failure) ([place])   . Elevated troponin I level   . Acute on chronic combined systolic and diastolic CHF, NYHA class 2 ([place])   . Type II diabetes mellitus ([place]) 08/19/2015  . HCAP (healthcare-associated pneumonia) 08/19/2015  . Hyperthyroidism 08/19/2015  . Hypothyroid 08/19/2015  . AKI (acute kidney injury) ([place]) 08/19/2015  . Blood poisoning ([place])   . Chronic combined systolic and diastolic congestive heart failure ([place]) 08/17/2015  . Restless legs syndrome 03/14/2015  . DOE (dyspnea on exertion) 03/09/2015  . Sinus bradycardia 09/21/2014  . Thoracic spondylosis with myelopathy 09/13/2014  . Acute diastolic heart failure ([place]) 07/18/2014  . Cervical arthritis with myelopathy 07/18/2014  . Constipation - functional   . CAP (community acquired pneumonia) 07/10/2014  . Sepsis ([place]) 07/10/2014  . HTN (hypertension) 07/10/2014  . OSA on CPAP 07/10/2014  . UTI (lower urinary tract infection) 07/10/2014  . Edema leg 07/10/2014  . Acute respiratory failure with hypoxia ([place]) 07/10/2014  . 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 Other vitamin B12 deficiency anemia 07/13/2013  . 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  Polyneuropathy in other diseases classified elsewhere ([place]) 02/17/2013  . Other general symptoms(780.99) 02/17/2013  . Abnormality of gait 02/17/2013  . Cervical spondylosis with myelopathy 02/17/2013  . Back ache 09/15/2012    Willow Ora, PTA, [facility] Outpatient Neuro [facility] [address], [place], [place] [address] [phone] 03/06/17, 7:29 PM   Name: SADIEL MOTA MRN: [id number] Date of Birth: 1946/01/03

## 2017-03-10 ENCOUNTER — Ambulatory Visit: Payer: Medicare Other | Admitting: Physical Therapy

## 2017-03-12 ENCOUNTER — Encounter: Payer: Self-pay | Admitting: Rehabilitation

## 2017-03-12 ENCOUNTER — Ambulatory Visit: Payer: Medicare Other | Admitting: Rehabilitation

## 2017-03-12 DIAGNOSIS — R2689 Other abnormalities of gait and mobility: Secondary | ICD-10-CM | POA: Diagnosis not present

## 2017-03-12 DIAGNOSIS — M6281 Muscle weakness (generalized): Secondary | ICD-10-CM

## 2017-03-12 DIAGNOSIS — R269 Unspecified abnormalities of gait and mobility: Secondary | ICD-10-CM

## 2017-03-12 DIAGNOSIS — R278 Other lack of coordination: Secondary | ICD-10-CM

## 2017-03-12 NOTE — Therapy (Signed)
[facility] [address] [place], [place], [address] Phone: [phone]   Fax:  [phone]  Physical Therapy Treatment  Patient Details  Name: Travis [name] MRN: [id number] Date of Birth: 1946/01/10 Referring Provider: Dr. Jannifer Franklin  Encounter Date: 03/12/2017      PT End of Session - 03/12/17 1536    Visit Number 6   Number of Visits 17   Date for PT Re-Evaluation 04/21/17   Authorization Type G-CODE AND PROGRESS NOTE EVERY 10TH VISIT.   PT Start Time 1533   PT Stop Time 1615   PT Time Calculation (min) 42 min   Equipment Utilized During Treatment Gait belt   Activity Tolerance Patient tolerated treatment well;Patient limited by fatigue;No increased pain   Behavior During Therapy WFL for tasks assessed/performed      Past Medical History:  Diagnosis Date  . Cervical myelopathy (HCC)    with myelomalacia at C5-6 and C3-4  . CHF (congestive heart failure) ([place])   . Constipation - functional    controlled with miralax  . DJD (degenerative joint disease)    WHOLE SPINE  . Gait disorder   . Heart murmur    Evaluated by cardiologist 4-5 years ago but does not currently see a cardiologist  . Hypertension   . Hyperthyroidism    Had Iodine to resolve issue DX 10 YR AGO  . Hypothyroidism    Low d/t treatment of hyperthyroidism  . 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 Peripheral neuropathy ([place])   . Pneumonia   . PONV (postoperative nausea and vomiting)   . RBBB   . Renal disorder    acute kidney failure  . Restless legs syndrome 03/14/2015  . RLS (restless legs syndrome)   . Sleep apnea    DX  2009/2010 study done at [facility]  . 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 Type II diabetes mellitus ([place])    diet controlled    Past Surgical History:  Procedure Laterality Date  . BACK SURGERY     6 surgeries  . CARDIAC CATHETERIZATION [name] 09/07/2015   Procedure: Right/Left Heart Cath and Coronary  Angiography;  Surgeon: Peter M Martinique, MD;  Location: [facility];  Service: Cardiovascular;  Laterality: [name];  . CHOLECYSTECTOMY    . LUMBAR LAMINECTOMY/DECOMPRESSION MICRODISCECTOMY  07/14/2012   Procedure: LUMBAR LAMINECTOMY/DECOMPRESSION MICRODISCECTOMY 2 LEVELS;  Surgeon: Eustace Moore, MD;  Location: [facility];  Service: Neurosurgery;  Laterality: Left;  Lumbar two three laminectomy, left thoracic four five transpedicular diskectomy    There were no vitals filed for this visit.      Subjective Assessment - 03/12/17 1535    Subjective Reports no changes, no falls.    Pertinent History Cervical myelopathy and spondylosis C5-6, C3-4, tx spine myelopathy T1-2 and T4-5, HTN, CHF, DM, DJD, hyperthyroidism, hypothyroidism, Lx spine spinal stenosis, peripheral neuropathy, R BBB, hx of back surgery   Patient Stated Goals Get rid of RW and walk with SPC.    Currently in Pain? No/denies                         [facility] Adult PT Treatment/Exercise - 03/12/17 0001      Ambulation/Gait   Ambulation/Gait Yes   Ambulation/Gait Assistance 5: Supervision   Ambulation/Gait Assistance Details Pt finally agreeable to trail use of PLS AFO for LLE due to foot drop.  Performed 50' gait with RW at S level with  marked improvement noted in LLE clearance, more efficient gait, and improved safety.  Pt also agreed with these things and would like to try it again next session before persuing.     Ambulation Distance (Feet) 50 Feet   Assistive device Rolling walker   Gait Pattern Step-through pattern;Decreased stride length;Decreased hip/knee flexion - right;Decreased hip/knee flexion - left;Decreased dorsiflexion - left;Shuffle;Trunk flexed   Ambulation Surface Level;Indoor     Neuro Re-ed    Neuro Re-ed Details  walking semi tandem x 2 reps down forwards and back backwards x 2 reps along counter.      Exercises   Exercises Other Exercises   Other Exercises  Sit<>stand initially from  mat, however this was too difficult, therefore moved to smaller stool and had him perform 6 sit<>stands with mod A and cues for forward weight shift and increased push through LEs.  Performed heel raises at counter top x 10 reps with cues for slower speed and decreased UE support, side stepping with mini squats x 2 reps down and back at counter top for support.  Marching along counter top in place with single UE support x 20 reps total progressing to marching forwards along counter top x 1 rep.                 PT Education - 03/12/17 1536    Education provided Yes   Education Details benefits of wearing PLS AFO   Person(s) Educated Patient   Methods Explanation   Comprehension Verbalized understanding          PT Short Term Goals - 02/26/17 6283      PT SHORT TERM GOAL #1   Title Pt will be independent with home exercise program to address lower extremity and core weakness and balance impairment. TARGET DATE FOR ALL STGS: 03/20/17   Status New     PT SHORT TERM GOAL #2   Title Perform BERG and write LTG as indicated.   Status New     PT SHORT TERM GOAL #3   Title Pt will amb. 400' over even terrain with RW at MOD I level to improve functional mobility.    Status New     PT SHORT TERM GOAL #4   Title Pt will improve TUG time with RW to </=25 sec. to decr. falls risk.    Status New     PT SHORT TERM GOAL #5   Title Pt will improve gait speed with RW to >/=1.13ft/sec. to decr. falls risk.    Status New     PT SHORT TERM GOAL #6   Title Pt will improve 6MWT distance  by 75' from baseline in order to indicate improved functional endurance.     Baseline 292' on 02/24/17   Status Revised           PT Long Term Goals - 02/26/17 6629      PT LONG TERM GOAL #1   Title Pt will perform TUG in </=13.5 sec. with RW to decr. falls risk. TARGET DATE FOR ALL LTGS: 04/17/17   Status New     PT LONG TERM GOAL #2   Title Pt will amb. 100' with SPC, with S, in order to amb.  safely at home with [facility].   Status New     PT LONG TERM GOAL #3   Title Pt will improve gait speed to >/=2.6ft/sec. with RW to safely amb. in the community.    Status New     PT  LONG TERM GOAL #4   Title Pt will amb. 600' over even/uneven terrain with RW, at MOD I level, to improve functional mobility.    Status New     PT LONG TERM GOAL #5   Title Pt will improve 6MWT distance by 150' from baseline in order to indicate improved functional endurance.     Status New               Plan - 03/12/17 1536    Clinical Impression Statement Skilled session focused on BLE strengthening, balance, and trial use of PLS AFO on LLE.  Note marked improvement with balance and gait efficiency as well as safety.  Pt would like to trial again in next session.    Rehab Potential Good   Clinical Impairments Affecting Rehab Potential see PMH   PT Frequency 2x / week   PT Duration 8 weeks   PT Treatment/Interventions ADLs/Self Care Home Management;Biofeedback;Electrical Stimulation;Neuromuscular re-education;Balance training;Therapeutic exercise;Therapeutic activities;Manual techniques;Functional mobility training;Stair training;Gait training;DME Instruction;Orthotic Fit/Training;Patient/family education;Vestibular   PT Next Visit Plan use PLS AFO on LLE and place request for order as needed, continued to work on balance, LE strengthening and flexibility. advance HEP as needed   Consulted and Agree with Plan of Care Patient   Family Member Consulted --      Patient will benefit from skilled therapeutic intervention in order to improve the following deficits and impairments:  Abnormal gait, Decreased endurance, Impaired tone, Postural dysfunction, Impaired flexibility, Decreased coordination, Decreased range of motion, Decreased balance, Decreased mobility, Decreased knowledge of use of DME, Decreased strength, Impaired sensation  Visit Diagnosis: Other abnormalities of gait and mobility  Muscle  weakness (generalized)  Other lack of coordination  Abnormality of gait     Problem List Patient Active Problem List   Diagnosis Date Noted  . Chronic diastolic CHF (congestive heart failure) ([place])   . Elevated troponin I level   . Acute on chronic combined systolic and diastolic CHF, NYHA class 2 ([place])   . Type II diabetes mellitus ([place]) 08/19/2015  . HCAP (healthcare-associated pneumonia) 08/19/2015  . Hyperthyroidism 08/19/2015  . Hypothyroid 08/19/2015  . AKI (acute kidney injury) ([place]) 08/19/2015  . Blood poisoning ([place])   . Chronic combined systolic and diastolic congestive heart failure ([place]) 08/17/2015  . Restless legs syndrome 03/14/2015  . DOE (dyspnea on exertion) 03/09/2015  . Sinus bradycardia 09/21/2014  . Thoracic spondylosis with myelopathy 09/13/2014  . Acute diastolic heart failure ([place]) 07/18/2014  . Cervical arthritis with myelopathy 07/18/2014  . Constipation - functional   . CAP (community acquired pneumonia) 07/10/2014  . Sepsis ([place]) 07/10/2014  . HTN (hypertension) 07/10/2014  . OSA on CPAP 07/10/2014  . UTI (lower urinary tract infection) 07/10/2014  . Edema leg 07/10/2014  . Acute respiratory failure with hypoxia ([place]) 07/10/2014  . 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 Other vitamin B12 deficiency anemia 07/13/2013  . 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 Polyneuropathy in other diseases classified elsewhere ([facility]) 02/17/2013  . Other general symptoms(780.99) 02/17/2013  . Abnormality of gait 02/17/2013  . Cervical spondylosis with myelopathy 02/17/2013  . Back ache 09/15/2012    Cameron Sprang, PT, MPT [facility] [address] [place], [place], [address] Phone: [phone]   Fax:  [phone] 03/12/17, 4:36 PM  Name: LESS WOOLSEY MRN: [id number] Date of Birth: 01-17-1946

## 2017-03-17 ENCOUNTER — Ambulatory Visit: Payer: Medicare Other | Admitting: Physical Therapy

## 2017-03-17 ENCOUNTER — Encounter: Payer: Self-pay | Admitting: Physical Therapy

## 2017-03-17 DIAGNOSIS — R2689 Other abnormalities of gait and mobility: Secondary | ICD-10-CM

## 2017-03-17 DIAGNOSIS — R278 Other lack of coordination: Secondary | ICD-10-CM

## 2017-03-17 DIAGNOSIS — M6281 Muscle weakness (generalized): Secondary | ICD-10-CM

## 2017-03-18 NOTE — Therapy (Signed)
[facility] [address] [place], [place], [address] Phone: [phone]   Fax:  [phone]  Physical Therapy Treatment  Patient Details  Name: Travis [name] MRN: [id number] Date of Birth: 1946/11/01 Referring Provider: Dr. Jannifer Franklin  Encounter Date: 03/17/2017      PT End of Session - 03/17/17 1540    Visit Number 7   Number of Visits 17   Date for PT Re-Evaluation 04/21/17   Authorization Type G-CODE AND PROGRESS NOTE EVERY 10TH VISIT.   PT Start Time 1535   PT Stop Time 1615   PT Time Calculation (min) 40 min   Equipment Utilized During Treatment Gait belt   Activity Tolerance Patient tolerated treatment well;Patient limited by fatigue;No increased pain   Behavior During Therapy WFL for tasks assessed/performed      Past Medical History:  Diagnosis Date  . Cervical myelopathy (HCC)    with myelomalacia at C5-6 and C3-4  . CHF (congestive heart failure) ([place])   . Constipation - functional    controlled with miralax  . DJD (degenerative joint disease)    WHOLE SPINE  . Gait disorder   . Heart murmur    Evaluated by cardiologist 4-5 years ago but does not currently see a cardiologist  . Hypertension   . Hyperthyroidism    Had Iodine to resolve issue DX 10 YR AGO  . Hypothyroidism    Low d/t treatment of hyperthyroidism  . 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 Peripheral neuropathy ([place])   . Pneumonia   . PONV (postoperative nausea and vomiting)   . RBBB   . Renal disorder    acute kidney failure  . Restless legs syndrome 03/14/2015  . RLS (restless legs syndrome)   . Sleep apnea    DX  2009/2010 study done at [facility]  . 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 Type II diabetes mellitus ([place])    diet controlled    Past Surgical History:  Procedure Laterality Date  . BACK SURGERY     6 surgeries  . CARDIAC CATHETERIZATION [name] 09/07/2015   Procedure: Right/Left Heart Cath and Coronary  Angiography;  Surgeon: Peter M Martinique, MD;  Location: [facility];  Service: Cardiovascular;  Laterality: [name];  . CHOLECYSTECTOMY    . LUMBAR LAMINECTOMY/DECOMPRESSION MICRODISCECTOMY  07/14/2012   Procedure: LUMBAR LAMINECTOMY/DECOMPRESSION MICRODISCECTOMY 2 LEVELS;  Surgeon: Eustace Moore, MD;  Location: [facility];  Service: Neurosurgery;  Laterality: Left;  Lumbar two three laminectomy, left thoracic four five transpedicular diskectomy    There were no vitals filed for this visit.      Subjective Assessment - 03/17/17 1539    Subjective No new complaints. No falls or pain to report. Took him 2-3 days to recover from last session. Pt reports "I still don't think I want the braces". Reports feeling weak today.    Pertinent History Cervical myelopathy and spondylosis C5-6, C3-4, tx spine myelopathy T1-2 and T4-5, HTN, CHF, DM, DJD, hyperthyroidism, hypothyroidism, Lx spine spinal stenosis, peripheral neuropathy, R BBB, hx of back surgery   Patient Stated Goals Get rid of RW and walk with SPC.    Currently in Pain? No/denies             [facility] Adult PT Treatment/Exercise - 03/17/17 1541      Transfers   Transfers Sit to Stand;Stand to Sit   Sit to Stand 5: Supervision;4: Min guard;With upper extremity assist;From chair/3-in-1   Sit to  Stand Details (indicate cue type and reason) needs increased time with heavy UE reliance to stand   Stand to Sit 5: Supervision;With upper extremity assist;To chair/3-in-1   Stand to Sit Details uses UE's to assit with sitting, still with uncontrolled descent most times     Ambulation/Gait   Ambulation/Gait Yes   Ambulation/Gait Assistance 5: Supervision   Ambulation/Gait Assistance Details Gait with RW using brace on left LE. pt with notable improvement in left LE clearance with swing phase of gait. Pt reported increased ease with use of brace with gait. Pt agreeable to getting MD order for bil braces, not agreeable at this time to bil braces  however is willing to concider them and be prepared to get bil braces.                   Ambulation Distance (Feet) 110 Feet   Assistive device Rolling walker   Gait Pattern Step-through pattern;Decreased stride length;Decreased hip/knee flexion - right;Decreased hip/knee flexion - left;Decreased dorsiflexion - left;Shuffle;Trunk flexed   Ambulation Surface Level;Indoor     High Level Balance   High Level Balance Activities Side stepping;Marching forwards;Backward walking;Negotitating around obstacles;Negotiating over obstacles   High Level Balance Comments at counter top with UE support on counter and mod right HHA when left hand on counter: 3 laps each of marching and side stepping with cues on posture and ex form; backward gait x 4 laps with min assist and cues on step length and posture; stepping over 3 bolsters x 2 laps forward with cues on posture, weight shifting and sequencing; gait around 2 objects x 1 laps with cues on posture, step length and technique with min to mod assist for balance.                                            PT Short Term Goals - 02/26/17 0454      PT SHORT TERM GOAL #1   Title Pt will be independent with home exercise program to address lower extremity and core weakness and balance impairment. TARGET DATE FOR ALL STGS: 03/20/17   Status New     PT SHORT TERM GOAL #2   Title Perform BERG and write LTG as indicated.   Status New     PT SHORT TERM GOAL #3   Title Pt will amb. 400' over even terrain with RW at MOD I level to improve functional mobility.    Status New     PT SHORT TERM GOAL #4   Title Pt will improve TUG time with RW to </=25 sec. to decr. falls risk.    Status New     PT SHORT TERM GOAL #5   Title Pt will improve gait speed with RW to >/=1.84ft/sec. to decr. falls risk.    Status New     PT SHORT TERM GOAL #6   Title Pt will improve 6MWT distance  by 75' from baseline in order to indicate improved functional endurance.     Baseline  292' on 02/24/17   Status Revised           PT Long Term Goals - 02/26/17 0981      PT LONG TERM GOAL #1   Title Pt will perform TUG in </=13.5 sec. with RW to decr. falls risk. TARGET DATE FOR ALL LTGS: 04/17/17   Status New  PT LONG TERM GOAL #2   Title Pt will amb. 100' with SPC, with S, in order to amb. safely at home with [facility].   Status New     PT LONG TERM GOAL #3   Title Pt will improve gait speed to >/=2.39ft/sec. with RW to safely amb. in the community.    Status New     PT LONG TERM GOAL #4   Title Pt will amb. 600' over even/uneven terrain with RW, at MOD I level, to improve functional mobility.    Status New     PT LONG TERM GOAL #5   Title Pt will improve 6MWT distance by 150' from baseline in order to indicate improved functional endurance.     Status New           Plan - 03/17/17 1540    Clinical Impression Statement Skilled session continued to address gait with AFO on left leg and strengthening/balance activities. Pt agreeable to use of AFO on at least one LE, if not both after today's session. Will contact referring MD to request order for bil AFO's. Pt is making steady progress toward goals and should benefit from continued PT to progress toward unmet goals.    Rehab Potential Good   Clinical Impairments Affecting Rehab Potential see PMH   PT Frequency 2x / week   PT Duration 8 weeks   PT Treatment/Interventions ADLs/Self Care Home Management;Biofeedback;Electrical Stimulation;Neuromuscular re-education;Balance training;Therapeutic exercise;Therapeutic activities;Manual techniques;Functional mobility training;Stair training;Gait training;DME Instruction;Orthotic Fit/Training;Patient/family education;Vestibular   PT Next Visit Plan use PLS AFO on LLE (try both LE's if pt is agreeable); continue to work on balance, LE strengthening and flexibility. advance HEP as needed   Consulted and Agree with Plan of Care Patient      Patient will benefit from  skilled therapeutic intervention in order to improve the following deficits and impairments:  Abnormal gait, Decreased endurance, Impaired tone, Postural dysfunction, Impaired flexibility, Decreased coordination, Decreased range of motion, Decreased balance, Decreased mobility, Decreased knowledge of use of DME, Decreased strength, Impaired sensation  Visit Diagnosis: Other abnormalities of gait and mobility  Muscle weakness (generalized)  Other lack of coordination     Problem List Patient Active Problem List   Diagnosis Date Noted  . Chronic diastolic CHF (congestive heart failure) ([place])   . Elevated troponin I level   . Acute on chronic combined systolic and diastolic CHF, NYHA class 2 ([place])   . Type II diabetes mellitus ([place]) 08/19/2015  . HCAP (healthcare-associated pneumonia) 08/19/2015  . Hyperthyroidism 08/19/2015  . Hypothyroid 08/19/2015  . AKI (acute kidney injury) ([place]) 08/19/2015  . Blood poisoning ([place])   . Chronic combined systolic and diastolic congestive heart failure ([place]) 08/17/2015  . Restless legs syndrome 03/14/2015  . DOE (dyspnea on exertion) 03/09/2015  . Sinus bradycardia 09/21/2014  . Thoracic spondylosis with myelopathy 09/13/2014  . Acute diastolic heart failure ([place]) 07/18/2014  . Cervical arthritis with myelopathy 07/18/2014  . Constipation - functional   . CAP (community acquired pneumonia) 07/10/2014  . Sepsis ([place]) 07/10/2014  . HTN (hypertension) 07/10/2014  . OSA on CPAP 07/10/2014  . UTI (lower urinary tract infection) 07/10/2014  . Edema leg 07/10/2014  . Acute respiratory failure with hypoxia ([place]) 07/10/2014  . 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 Other vitamin B12 deficiency anemia 07/13/2013  . 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 Polyneuropathy in other diseases classified elsewhere ([place]) 02/17/2013  . Other general symptoms(780.99) 02/17/2013  . Abnormality of gait  02/17/2013  . Cervical  spondylosis with myelopathy 02/17/2013  . Back ache 09/15/2012    Willow Ora, PTA, [facility] [address], [place], [place] [address] [phone] 03/18/17, 6:49 PM   Name: ARLEN DUPUIS MRN: [id number] Date of Birth: October 26, 1946

## 2017-03-19 ENCOUNTER — Encounter: Payer: Self-pay | Admitting: Physical Therapy

## 2017-03-19 ENCOUNTER — Ambulatory Visit: Payer: Medicare Other | Admitting: Physical Therapy

## 2017-03-19 DIAGNOSIS — M6281 Muscle weakness (generalized): Secondary | ICD-10-CM

## 2017-03-19 DIAGNOSIS — R2689 Other abnormalities of gait and mobility: Secondary | ICD-10-CM

## 2017-03-20 ENCOUNTER — Telehealth: Payer: Self-pay | Admitting: Physical Therapy

## 2017-03-20 DIAGNOSIS — R269 Unspecified abnormalities of gait and mobility: Secondary | ICD-10-CM

## 2017-03-20 NOTE — Telephone Encounter (Signed)
Dr. Jannifer Franklin , Travis [name] is being treated by physical therapy for falls/decreased LE strength.  He will benefit from use of bilateral AFO's in order to improve safety with functional mobility.    If you agree, please submit request in EPIC under referral for DME (list bilateral  AFO in comments) or fax to [facility] at [phone].   Thank you,  Willow Ora, PTA, [facility] [address], [place], [place] [address] [phone] 03/20/17, 12:47 PM   [facility] [address] [place], [place]  [address] Phone:  [phone] Fax:  [phone]

## 2017-03-20 NOTE — Therapy (Addendum)
[facility] [address] [place], [place], [address] Phone: [phone]   Fax:  [phone]  Physical Therapy Treatment and DC Summary  Patient Details  Name: Travis [name] MRN: [id number] Date of Birth: May 27, 1946 Referring Provider: Dr. Jannifer Franklin  Encounter Date: 03/19/2017      PT End of Session - 03/19/17 1540    Visit Number 8   Number of Visits 17   Date for PT Re-Evaluation 04/21/17   Authorization Type G-CODE AND PROGRESS NOTE EVERY 10TH VISIT.   PT Start Time 1537  pt late for appt   PT Stop Time 1615   PT Time Calculation (min) 38 min   Equipment Utilized During Treatment Gait belt   Activity Tolerance Patient tolerated treatment well;Patient limited by fatigue;No increased pain   Behavior During Therapy WFL for tasks assessed/performed      Past Medical History:  Diagnosis Date  . Cervical myelopathy (HCC)    with myelomalacia at C5-6 and C3-4  . CHF (congestive heart failure) ([place])   . Constipation - functional    controlled with miralax  . DJD (degenerative joint disease)    WHOLE SPINE  . Gait disorder   . Heart murmur    Evaluated by cardiologist 4-5 years ago but does not currently see a cardiologist  . Hypertension   . Hyperthyroidism    Had Iodine to resolve issue DX 10 YR AGO  . Hypothyroidism    Low d/t treatment of hyperthyroidism  . 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 Peripheral neuropathy ([place])   . Pneumonia   . PONV (postoperative nausea and vomiting)   . RBBB   . Renal disorder    acute kidney failure  . Restless legs syndrome 03/14/2015  . RLS (restless legs syndrome)   . Sleep apnea    DX  2009/2010 study done at [facility]  . 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 Type II diabetes mellitus ([place])    diet controlled    Past Surgical History:  Procedure Laterality Date  . BACK SURGERY     6 surgeries  . CARDIAC CATHETERIZATION [name] 09/07/2015   Procedure: Right/Left  Heart Cath and Coronary Angiography;  Surgeon: Peter M Martinique, MD;  Location: [facility];  Service: Cardiovascular;  Laterality: [name];  . CHOLECYSTECTOMY    . LUMBAR LAMINECTOMY/DECOMPRESSION MICRODISCECTOMY  07/14/2012   Procedure: LUMBAR LAMINECTOMY/DECOMPRESSION MICRODISCECTOMY 2 LEVELS;  Surgeon: Eustace Moore, MD;  Location: [facility];  Service: Neurosurgery;  Laterality: Left;  Lumbar two three laminectomy, left thoracic four five transpedicular diskectomy    There were no vitals filed for this visit.      Subjective Assessment - 03/19/17 1539    Subjective No new complaints. No falls or pain to report. Not as sore after last session as he was the previous session.    Pertinent History Cervical myelopathy and spondylosis C5-6, C3-4, tx spine myelopathy T1-2 and T4-5, HTN, CHF, DM, DJD, hyperthyroidism, hypothyroidism, Lx spine spinal stenosis, peripheral neuropathy, R BBB, hx of back surgery   Patient Stated Goals Get rid of RW and walk with SPC.    Currently in Pain? No/denies            [facility] Adult PT Treatment/Exercise - 03/19/17 1541      Transfers   Transfers Sit to Stand;Stand to Sit   Sit to Stand 5: Supervision;4: Min guard;With upper extremity assist;From chair/3-in-1   Sit to Stand Details (indicate cue  type and reason) increased time needed with each stand    Stand to Sit 5: Supervision;With upper extremity assist;To chair/3-in-1   Stand to Sit Details uses UE's, still with uncontrolled descent with sitting down     Ambulation/Gait   Ambulation/Gait Yes   Ambulation/Gait Assistance 5: Supervision   Ambulation/Gait Assistance Details had bil posterior ottobocks on. notable increase in bil foot clearance with gait with improved step length with gait.    Ambulation Distance (Feet) 115 Feet   Assistive device Rolling walker   Gait Pattern Step-through pattern;Decreased stride length;Decreased hip/knee flexion - right;Decreased hip/knee flexion -  left;Decreased dorsiflexion - left;Shuffle;Trunk flexed   Ambulation Surface Level;Indoor     High Level Balance   High Level Balance Activities Backward walking;Marching forwards;Negotiating over obstacles   High Level Balance Comments at counter top with UE support and contralateral min/mod HHA with backward walking and forward marching: stepping over bolsters with UE support on counter/opposite side HHA with step to pattern x 4 laps forward, reciprocal pattern x 3 laps forward. increased assistance needed with reciprocal pattern.                                                     PT Short Term Goals - 02/26/17 9937      PT SHORT TERM GOAL #1   Title Pt will be independent with home exercise program to address lower extremity and core weakness and balance impairment. TARGET DATE FOR ALL STGS: 03/20/17   Status New     PT SHORT TERM GOAL #2   Title Perform BERG and write LTG as indicated.   Status New     PT SHORT TERM GOAL #3   Title Pt will amb. 400' over even terrain with RW at MOD I level to improve functional mobility.    Status New     PT SHORT TERM GOAL #4   Title Pt will improve TUG time with RW to </=25 sec. to decr. falls risk.    Status New     PT SHORT TERM GOAL #5   Title Pt will improve gait speed with RW to >/=1.11f/sec. to decr. falls risk.    Status New     PT SHORT TERM GOAL #6   Title Pt will improve 6MWT distance  by 75' from baseline in order to indicate improved functional endurance.     Baseline 292' on 02/24/17   Status Revised           PT Long Term Goals - 02/26/17 01696     PT LONG TERM GOAL #1   Title Pt will perform TUG in </=13.5 sec. with RW to decr. falls risk. TARGET DATE FOR ALL LTGS: 04/17/17   Status New     PT LONG TERM GOAL #2   Title Pt will amb. 100' with SPC, with S, in [facility]   Status New     PT LONG TERM GOAL #3   Title Pt will improve gait speed to >/=2.682fsec. with RW to safely amb.  in the community.    Status New     PT LONG TERM GOAL #4   Title Pt will amb. 600' over even/uneven terrain with RW, at MOD I level, to improve functional mobility.    Status New  PT LONG TERM GOAL #5   Title Pt will improve 6MWT distance by 150' from baseline in order to indicate improved functional endurance.     Status New           Plan - 03/19/17 1541    Clinical Impression Statement Today's skilled session continued to address gait wih bil AFO's used today. Pt reported they did make gait easier and is agreeable to persuing braces for both legs. Will contact MD to get order and orthotist to set up time for him to come to one of pt's sessions to determine the best option. Remainder of session continued to address balance with pt continuing to need heavy UE support for balance. Pt is making steady progress and should benefit from continued PT to progress toward goals                      Rehab Potential Good   Clinical Impairments Affecting Rehab Potential see PMH   PT Frequency 2x / week   PT Duration 8 weeks   PT Treatment/Interventions ADLs/Self Care Home Management;Biofeedback;Electrical Stimulation;Neuromuscular re-education;Balance training;Therapeutic exercise;Therapeutic activities;Manual techniques;Functional mobility training;Stair training;Gait training;DME Instruction;Orthotic Fit/Training;Patient/family education;Vestibular   PT Next Visit Plan use PLS AFO on bil legs with gait/balance activities; continue to work on balance, LE strengthening and flexibility. advance HEP as needed   Consulted and Agree with Plan of Care Patient      Patient will benefit from skilled therapeutic intervention in order to improve the following deficits and impairments:  Abnormal gait, Decreased endurance, Impaired tone, Postural dysfunction, Impaired flexibility, Decreased coordination, Decreased range of motion, Decreased balance, Decreased mobility, Decreased knowledge of use of DME,  Decreased strength, Impaired sensation  Visit Diagnosis: Other abnormalities of gait and mobility  Muscle weakness (generalized)     PHYSICAL THERAPY DISCHARGE SUMMARY  Visits from Start of Care: 8  Current functional level related to goals / functional outcomes: See above, note current hospitalization and upon looking at notes, D/C is unclear to whether it will be home or SNF.  PTA sent message to pt/family regarding getting new order as well as how to get AFO from [facility].  Will d/c at this time.    Remaining deficits: Unsure at this time due to recent hospitalization, see above for most recent deficits.    Education / Equipment: HEP  Plan: Patient agrees to discharge.  Patient goals were not met. Patient is being discharged due to a change in medical status.  ?????    Addendum: G Codes Functional Assessment Tool: TUG:  40.3 secs with RW, gait speed 1.4 ft/sec with RW Functional Limitation:  Mobility: Walking and Moving Around Current [phone]): CL Goal Status (J0932): CJ D/C Status (I7124): CL     Gcode and DC summary entered by:  Cameron Sprang, PT, MPT [facility] [address] [place], [place], [address] Phone: [phone]   Fax:  [phone] 03/30/17, 10:48 AM          Problem List Patient Active Problem List   Diagnosis Date Noted  . Chronic diastolic CHF (congestive heart failure) ([place])   . Elevated troponin I level   . Acute on chronic combined systolic and diastolic CHF, NYHA class 2 ([place])   . Type II diabetes mellitus ([place]) 08/19/2015  . HCAP (healthcare-associated pneumonia) 08/19/2015  . Hyperthyroidism 08/19/2015  . Hypothyroid 08/19/2015  . AKI (acute kidney injury) ([place]) 08/19/2015  . Blood poisoning ([place])   . Chronic combined  systolic and diastolic congestive heart failure ([place]) 08/17/2015  . Restless legs syndrome 03/14/2015  . DOE (dyspnea on exertion) 03/09/2015  . Sinus  bradycardia 09/21/2014  . Thoracic spondylosis with myelopathy 09/13/2014  . Acute diastolic heart failure ([place]) 07/18/2014  . Cervical arthritis with myelopathy 07/18/2014  . Constipation - functional   . CAP (community acquired pneumonia) 07/10/2014  . Sepsis ([place]) 07/10/2014  . HTN (hypertension) 07/10/2014  . OSA on CPAP 07/10/2014  . UTI (lower urinary tract infection) 07/10/2014  . Edema leg 07/10/2014  . Acute respiratory failure with hypoxia ([place]) 07/10/2014  . 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 Other vitamin B12 deficiency anemia 07/13/2013  . 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 Polyneuropathy in other diseases classified elsewhere ([place]) 02/17/2013  . Other general symptoms(780.99) 02/17/2013  . Abnormality of gait 02/17/2013  . Cervical spondylosis with myelopathy 02/17/2013  . Back ache 09/15/2012    Willow Ora, PTA, [facility] Neuro [facility] [address], [place], [place] [address] [phone] 03/20/17, 1:02 PM   Name: Travis [name] MRN: [id number] Date of Birth: [birth year]

## 2017-03-20 NOTE — Telephone Encounter (Signed)
Physical therapy believes the patient may benefit from AFO braces bilaterally, he had no evidence of foot drops on my last clinical examination, but it is felt that these braces will still help his walking safety.  I will write a prescription for the AFO braces.

## 2017-03-24 ENCOUNTER — Ambulatory Visit: Payer: Medicare Other | Admitting: Physical Therapy

## 2017-03-26 ENCOUNTER — Ambulatory Visit: Payer: Medicare Other | Admitting: Physical Therapy

## 2017-03-26 ENCOUNTER — Inpatient Hospital Stay (HOSPITAL_COMMUNITY)
Admission: AD | Admit: 2017-03-26 | Discharge: 2017-04-01 | DRG: 291 | Disposition: A | Discharge status: Home Health | Payer: Medicare Other | Source: Other Acute Inpatient Hospital | Attending: Nephrology | Admitting: Nephrology

## 2017-03-26 ENCOUNTER — Inpatient Hospital Stay (HOSPITAL_COMMUNITY): Payer: Medicare Other

## 2017-03-26 DIAGNOSIS — I130 Hypertensive heart and chronic kidney disease with heart failure and stage 1 through stage 4 chronic kidney disease, or unspecified chronic kidney disease: Secondary | ICD-10-CM | POA: Diagnosis present

## 2017-03-26 DIAGNOSIS — G2581 Restless legs syndrome: Secondary | ICD-10-CM | POA: Diagnosis present

## 2017-03-26 DIAGNOSIS — Z7982 Long term (current) use of aspirin: Secondary | ICD-10-CM

## 2017-03-26 DIAGNOSIS — Z833 Family history of diabetes mellitus: Secondary | ICD-10-CM | POA: Diagnosis not present

## 2017-03-26 DIAGNOSIS — K5904 Chronic idiopathic constipation: Secondary | ICD-10-CM | POA: Diagnosis present

## 2017-03-26 DIAGNOSIS — Z79899 Other long term (current) drug therapy: Secondary | ICD-10-CM | POA: Diagnosis not present

## 2017-03-26 DIAGNOSIS — J9601 Acute respiratory failure with hypoxia: Secondary | ICD-10-CM | POA: Diagnosis present

## 2017-03-26 DIAGNOSIS — E1142 Type 2 diabetes mellitus with diabetic polyneuropathy: Secondary | ICD-10-CM | POA: Diagnosis present

## 2017-03-26 DIAGNOSIS — R011 Cardiac murmur, unspecified: Secondary | ICD-10-CM | POA: Diagnosis present

## 2017-03-26 DIAGNOSIS — J181 Lobar pneumonia, unspecified organism: Secondary | ICD-10-CM | POA: Diagnosis not present

## 2017-03-26 DIAGNOSIS — Z794 Long term (current) use of insulin: Secondary | ICD-10-CM

## 2017-03-26 DIAGNOSIS — B965 Pseudomonas (aeruginosa) (mallei) (pseudomallei) as the cause of diseases classified elsewhere: Secondary | ICD-10-CM | POA: Diagnosis present

## 2017-03-26 DIAGNOSIS — I5033 Acute on chronic diastolic (congestive) heart failure: Secondary | ICD-10-CM

## 2017-03-26 DIAGNOSIS — N400 Enlarged prostate without lower urinary tract symptoms: Secondary | ICD-10-CM | POA: Diagnosis present

## 2017-03-26 DIAGNOSIS — Z6837 Body mass index (BMI) 37.0-37.9, adult: Secondary | ICD-10-CM

## 2017-03-26 DIAGNOSIS — Z7984 Long term (current) use of oral hypoglycemic drugs: Secondary | ICD-10-CM | POA: Diagnosis not present

## 2017-03-26 DIAGNOSIS — E0590 Thyrotoxicosis, unspecified without thyrotoxic crisis or storm: Secondary | ICD-10-CM

## 2017-03-26 DIAGNOSIS — Z881 Allergy status to other antibiotic agents status: Secondary | ICD-10-CM | POA: Diagnosis not present

## 2017-03-26 DIAGNOSIS — E039 Hypothyroidism, unspecified: Secondary | ICD-10-CM | POA: Diagnosis present

## 2017-03-26 DIAGNOSIS — J189 Pneumonia, unspecified organism: Secondary | ICD-10-CM

## 2017-03-26 DIAGNOSIS — I160 Hypertensive urgency: Secondary | ICD-10-CM | POA: Diagnosis present

## 2017-03-26 DIAGNOSIS — I5031 Acute diastolic (congestive) heart failure: Secondary | ICD-10-CM | POA: Diagnosis not present

## 2017-03-26 DIAGNOSIS — E119 Type 2 diabetes mellitus without complications: Secondary | ICD-10-CM | POA: Diagnosis not present

## 2017-03-26 DIAGNOSIS — N183 Chronic kidney disease, stage 3 (moderate): Secondary | ICD-10-CM | POA: Diagnosis present

## 2017-03-26 DIAGNOSIS — I272 Pulmonary hypertension, unspecified: Secondary | ICD-10-CM | POA: Diagnosis present

## 2017-03-26 DIAGNOSIS — Z884 Allergy status to anesthetic agent status: Secondary | ICD-10-CM | POA: Diagnosis not present

## 2017-03-26 DIAGNOSIS — I4510 Unspecified right bundle-branch block: Secondary | ICD-10-CM | POA: Diagnosis present

## 2017-03-26 DIAGNOSIS — Z9111 Patient's noncompliance with dietary regimen: Secondary | ICD-10-CM

## 2017-03-26 DIAGNOSIS — A419 Sepsis, unspecified organism: Secondary | ICD-10-CM

## 2017-03-26 DIAGNOSIS — Z832 Family history of diseases of the blood and blood-forming organs and certain disorders involving the immune mechanism: Secondary | ICD-10-CM | POA: Diagnosis not present

## 2017-03-26 DIAGNOSIS — R2689 Other abnormalities of gait and mobility: Secondary | ICD-10-CM | POA: Diagnosis not present

## 2017-03-26 DIAGNOSIS — I2510 Atherosclerotic heart disease of native coronary artery without angina pectoris: Secondary | ICD-10-CM | POA: Diagnosis present

## 2017-03-26 DIAGNOSIS — E1122 Type 2 diabetes mellitus with diabetic chronic kidney disease: Secondary | ICD-10-CM | POA: Diagnosis present

## 2017-03-26 DIAGNOSIS — Z8249 Family history of ischemic heart disease and other diseases of the circulatory system: Secondary | ICD-10-CM | POA: Diagnosis not present

## 2017-03-26 DIAGNOSIS — Z87891 Personal history of nicotine dependence: Secondary | ICD-10-CM | POA: Diagnosis not present

## 2017-03-26 DIAGNOSIS — G4733 Obstructive sleep apnea (adult) (pediatric): Secondary | ICD-10-CM | POA: Diagnosis present

## 2017-03-26 DIAGNOSIS — N390 Urinary tract infection, site not specified: Secondary | ICD-10-CM | POA: Diagnosis present

## 2017-03-26 HISTORY — DX: Dyspnea, unspecified: R06.00

## 2017-03-26 MED ORDER — GUAIFENESIN ER 600 MG PO TB12
600.0000 mg | ORAL_TABLET | Freq: Two times a day (BID) | ORAL | Status: DC
  Administered 2017-03-27 – 2017-04-01 (×11): 600 mg via ORAL
  Filled 2017-03-26 (×12): qty 1

## 2017-03-26 MED ORDER — CEFTRIAXONE SODIUM 1 G IJ SOLR
1.0000 g | INTRAMUSCULAR | Status: DC
  Administered 2017-03-27 – 2017-03-28 (×2): 1 g via INTRAVENOUS
  Filled 2017-03-26 (×2): qty 10

## 2017-03-26 MED ORDER — FUROSEMIDE 10 MG/ML IJ SOLN
40.0000 mg | Freq: Two times a day (BID) | INTRAMUSCULAR | Status: DC
  Administered 2017-03-26 – 2017-03-30 (×8): 40 mg via INTRAVENOUS
  Filled 2017-03-26 (×8): qty 4

## 2017-03-26 MED ORDER — ACETAMINOPHEN 325 MG PO TABS
650.0000 mg | ORAL_TABLET | Freq: Four times a day (QID) | ORAL | Status: DC | PRN
  Administered 2017-03-27: 650 mg via ORAL
  Filled 2017-03-26: qty 2

## 2017-03-26 MED ORDER — ENOXAPARIN SODIUM 40 MG/0.4ML ~~LOC~~ SOLN
40.0000 mg | Freq: Every day | SUBCUTANEOUS | Status: DC
  Administered 2017-03-27 – 2017-03-31 (×5): 40 mg via SUBCUTANEOUS
  Filled 2017-03-26 (×6): qty 0.4

## 2017-03-26 MED ORDER — IPRATROPIUM-ALBUTEROL 0.5-2.5 (3) MG/3ML IN SOLN: 3.0000 mL | RESPIRATORY_TRACT | Status: DC | PRN

## 2017-03-26 MED ORDER — ONDANSETRON HCL 4 MG PO TABS: 4.0000 mg | ORAL_TABLET | Freq: Four times a day (QID) | ORAL | Status: DC | PRN

## 2017-03-26 MED ORDER — ONDANSETRON HCL 4 MG/2ML IJ SOLN
4.0000 mg | Freq: Four times a day (QID) | INTRAMUSCULAR | Status: DC | PRN
  Administered 2017-03-28: 4 mg via INTRAVENOUS
  Filled 2017-03-26: qty 2

## 2017-03-26 MED ORDER — DEXTROSE 5 % IV SOLN
500.0000 mg | INTRAVENOUS | Status: DC
  Administered 2017-03-27 – 2017-03-28 (×2): 500 mg via INTRAVENOUS
  Filled 2017-03-26 (×3): qty 500

## 2017-03-26 MED ORDER — ACETAMINOPHEN 650 MG RE SUPP: 650.0000 mg | Freq: Four times a day (QID) | RECTAL | Status: DC | PRN

## 2017-03-26 NOTE — Progress Notes (Signed)
Pharmacy Antibiotic Note  Crispin P Freese is a 71 y.o. male admitted on 03/26/2017 with pneumonia.  Pharmacy has been consulted for Ceftriaxone/Azithromycin dosing. Transfer from [facility]. Received Ceftriaxone/Azithromycin x 1 at [facility] 3/29 ~1400.  Plan: -Ceftriaxone 1g IV q24h -Azithromycin 500 mg IV q24h -Trend WBC, temp -F/U infectious work-up   Temp (24hrs), Avg:98.7 F (37.1 C), Min:98.7 F (37.1 C), Max:98.7 F (37.1 C)   Allergies  Allergen Reactions  . Other Other (See Comments)    Anesthesia makes sick, must have antinausea medication in advance.    Narda Bonds 03/26/2017 11:53 PM

## 2017-03-26 NOTE — H&P (Addendum)
History and Physical    Travis [name] AST:[id number] DOB: Jan 12, 1946 DOA: 03/26/2017  Referring MD/NP/PA: Dr. Allyson Sabal PCP: Simona Huh, MD  Patient coming from: [facility] transfer  Chief Complaint: Shortness of breath  HPI: Travis [name] is a 71 y.o. male with medical history significant of HTN, chronic diastolic CHF and EF 98-92% in 02/2017, CKD stage III, DM type 2; who presented to the outside facility with shortness of breath and cough starting this morning.  Otherwise he had reported being in his normal state of health. He reports when he would start coughing he would go into these coughing spells and be unable to stop or his breath.Associated symptoms include subjective fever and lower extremity leg swelling. Patient denies any recent changes in his weight, chest pain, or dysuria.  Followed by Dr. Cathie Olden of cardiology.  ED Course: On admission to the emergency department at [facility] patient was noted to have fever up to 102.46F, pulse 120, respirations 30, O2 saturations 89%, blood pressure 206/95. Patient was required placement to BiPAP with some improvement in respiratory distress. Chest x-ray showed low lung volumes with minimal bibasilar atelectasis. Influenza screen negative. Patient was started empirically on antibiotics of ceftriaxone and azithromycin. Reportedly patient was given torsemide 20 mg orally at the outside facility per his home dose. Family requested transfer to [facility] as patient's doctors are here.   Review of Systems: As per HPI otherwise 10 point review of systems negative.   Past Medical History:  Diagnosis Date  . Cervical myelopathy (HCC)    with myelomalacia at C5-6 and C3-4  . CHF (congestive heart failure) ([place])   . Constipation - functional    controlled with miralax  . DJD (degenerative joint disease)    WHOLE SPINE  . Gait disorder   . Heart murmur    Evaluated by cardiologist 4-5 years ago but does not currently see a  cardiologist  . Hypertension   . Hyperthyroidism    Had Iodine to resolve issue DX 10 YR AGO  . Hypothyroidism    Low d/t treatment of hyperthyroidism  . 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 Peripheral neuropathy ([place])   . Pneumonia   . PONV (postoperative nausea and vomiting)   . RBBB   . Renal disorder    acute kidney failure  . Restless legs syndrome 03/14/2015  . RLS (restless legs syndrome)   . Sleep apnea    DX  2009/2010 study done at [facility]  . 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 Type II diabetes mellitus ([place])    diet controlled    Past Surgical History:  Procedure Laterality Date  . BACK SURGERY     6 surgeries  . CARDIAC CATHETERIZATION [name] 09/07/2015   Procedure: Right/Left Heart Cath and Coronary Angiography;  Surgeon: Peter M Martinique, MD;  Location: [facility];  Service: Cardiovascular;  Laterality: [name];  . CHOLECYSTECTOMY    . LUMBAR LAMINECTOMY/DECOMPRESSION MICRODISCECTOMY  07/14/2012   Procedure: LUMBAR LAMINECTOMY/DECOMPRESSION MICRODISCECTOMY 2 LEVELS;  Surgeon: Eustace Moore, MD;  Location: [facility];  Service: Neurosurgery;  Laterality: Left;  Lumbar two three laminectomy, left thoracic four five transpedicular diskectomy     reports that he quit smoking about 31 years ago. His smoking use included Cigarettes. He has a 40.00 pack-year smoking history. He quit smokeless tobacco use about 31 years ago. He reports that he does not drink alcohol or use drugs.  Allergies  Allergen Reactions  . Other  Other (See Comments)    Anesthesia makes sick, must have antinausea medication in advance.    Family History  Problem Relation Age of Onset  . Polycythemia Mother   . Diabetes Father   . Hypertension Father   . Heart disease Father   . CAD      1 of his 4 brothers and father has CAD    Prior to Admission medications   Medication Sig Start Date End Date Taking? Authorizing Provider  aspirin EC 81 MG EC tablet Take 1 tablet (81 mg total) by  mouth daily. 08/23/15   Thurnell Lose, MD  baclofen (LIORESAL) 10 MG tablet Take 1 tablet (10 mg total) by mouth 3 (three) times daily. For muscle spasms 10/30/14   Meredith Staggers, MD  diclofenac (VOLTAREN) 75 MG EC tablet Take 75 mg by mouth daily. 07/14/15   Historical Provider, MD  escitalopram (LEXAPRO) 10 MG tablet Take 10 mg by mouth daily. 07/12/15   Historical Provider, MD  gabapentin (NEURONTIN) 400 MG capsule TAKE 1 CAPSULE (400 MG TOTAL) BY MOUTH EVERY 8 (EIGHT) HOURS. 04/16/15   Meredith Staggers, MD  glimepiride (AMARYL) 2 MG tablet Take 2 mg by mouth daily with breakfast.    Historical Provider, MD  hydrALAZINE (APRESOLINE) 25 MG tablet Take 1 tablet (25 mg total) by mouth every 8 (eight) hours. 08/23/15   Thurnell Lose, MD  levothyroxine (SYNTHROID, LEVOTHROID) 175 MCG tablet Take 175 mcg by mouth daily. 08/12/15   Historical Provider, MD  losartan (COZAAR) 50 MG tablet TAKE 1 TABLET BY MOUTH ONCE DAILY. 09/30/16   [name], MD  polyethylene glycol [facility] / Floria Raveling) packet Take 17 g by mouth daily as needed. For constipation 08/09/14   Ivan Anchors Love, [name]  potassium chloride (KLOR-CON 10) 10 MEQ tablet Take 1 tablet (10 mEq total) by mouth 2 (two) times daily. 09/25/15   [name], MD  pramipexole (MIRAPEX) 0.5 MG tablet Take 0.5 mg by mouth 2 (two) times daily.  08/09/14   Bary Leriche, [name]  tamsulosin (FLOMAX) 0.4 MG CAPS capsule Take 0.4 mg by mouth daily.  09/13/14   Historical Provider, MD  torsemide (DEMADEX) 20 MG tablet Take 1 tablet (20 mg total) by mouth 2 (two) times daily. Take additional 10 mg as needed for weight gain or shortness of breath. 02/18/17   Bhavinkumar Bhagat, PA  TOVIAZ 8 MG TB24 tablet Take 8 mg by mouth daily. 08/07/15   Historical Provider, MD    Physical Exam:     Constitutional: Morbidly obese male who appears to be more comfortable without as much respiratory distress. Vitals:   03/26/17 2100  BP: (!) 163/73  Pulse: 74  Resp: (!) 22   Temp: 98.7 F (37.1 C)  TempSrc: Axillary  SpO2: 99%   Eyes: PERRL, lids and conjunctivae normal ENMT: Mucous membranes are moist. Posterior pharynx clear of any exudate or lesions.Normal dentition.  Neck: normal, supple, no masses, no thyromegaly Respiratory: Tachypneic with decreased overall aeration and crackles appreciated on lung exam. No significant wheezes appreciated. Currently on BiPAP. Cardiovascular: Regular rate and rhythm, no murmurs / rubs / gallops. 3+ pitting bilateral extremity edema. 2+ pedal pulses. No carotid bruits.  Abdomen: no tenderness, no masses palpated. No hepatosplenomegaly. Bowel sounds positive.  Musculoskeletal: no clubbing / cyanosis. No joint deformity upper and lower extremities. Good ROM, no contractures. Normal muscle tone.  Skin: no rashes, lesions, ulcers. No induration Neurologic: CN 2-12 grossly intact. Sensation intact,  DTR normal. Strength 5/5 in all 4.  Psychiatric: Normal judgment and insight. Alert and oriented x 3. Normal mood.     Labs on Admission: I have personally reviewed following labs and imaging studies  CBC: No results for input(s): WBC, NEUTROABS, HGB, HCT, MCV, PLT in the last 168 hours. Basic Metabolic Panel: No results for input(s): NA, K, CL, CO2, GLUCOSE, BUN, CREATININE, CALCIUM, MG, PHOS in the last 168 hours. GFR: CrCl cannot be calculated (Patient's most recent lab result is older than the maximum 21 days allowed.). Liver Function Tests: No results for input(s): AST, ALT, ALKPHOS, BILITOT, PROT, ALBUMIN in the last 168 hours. No results for input(s): LIPASE, AMYLASE in the last 168 hours. No results for input(s): AMMONIA in the last 168 hours. Coagulation Profile: No results for input(s): INR, PROTIME in the last 168 hours. Cardiac Enzymes: No results for input(s): CKTOTAL, CKMB, CKMBINDEX, TROPONINI in the last 168 hours. BNP (last 3 results)  Recent Labs  02/12/17 1522  PROBNP 109   HbA1C: No results for  input(s): HGBA1C in the last 72 hours. CBG: No results for input(s): GLUCAP in the last 168 hours. Lipid Profile: No results for input(s): CHOL, HDL, LDLCALC, TRIG, CHOLHDL, LDLDIRECT in the last 72 hours. Thyroid Function Tests: No results for input(s): TSH, T4TOTAL, FREET4, T3FREE, THYROIDAB in the last 72 hours. Anemia Panel: No results for input(s): VITAMINB12, FOLATE, FERRITIN, TIBC, IRON, RETICCTPCT in the last 72 hours. Urine analysis:    Component Value Date/Time   COLORURINE YELLOW 08/22/2015 1820   APPEARANCEUR CLEAR 08/22/2015 1820   LABSPEC 1.014 08/22/2015 1820   PHURINE 6.5 08/22/2015 1820   GLUCOSEU NEGATIVE 08/22/2015 1820   HGBUR NEGATIVE 08/22/2015 1820   BILIRUBINUR NEGATIVE 08/22/2015 1820   KETONESUR NEGATIVE 08/22/2015 1820   PROTEINUR NEGATIVE 08/22/2015 1820   UROBILINOGEN 0.2 08/22/2015 1820   NITRITE NEGATIVE 08/22/2015 1820   LEUKOCYTESUR SMALL (A) 08/22/2015 1820   Sepsis Labs: No results found for this or any previous visit (from the past 240 hour(s)).   Radiological Exams on Admission: No results found.   Assessment/Plan Acute respiratory failure with hypoxia with community-acquired pneumonia: Patient was noted to be febrile up to 102.28F, tachycardic, and tachypneic at outside facility. Found to be negative for influenza and was started on empiric antibiotics for community-acquired pneumonia with ceftriaxone and azithromycin prior to transport to our facility. - Admit to stepdown - Sepsis protocol initiated  - Follow-up blood and sputum cultures in our facility and at [place] empiric antibiotics of ceftriaxone and azithromycin - Check chest x-ray  Acute diastolic heart failure [facility]): Patient presents with 3+ pitting edema and chest x-ray showing signs of pulmonary vascular congestion. Last echocardiogram was from 03/06/2017 with EF of 65-70% and grade 2 diastolic dysfunction. She is followed by Dr. Cathie Olden of cardiology -  Strict I&Os and daily weights  - Check BNP  - Lasix 40 mg IV twice a day - Will need to consult cardiology in a.m.  Essential hypertension - Continue home medications as tolerated  Diabetes mellitus type 2 with peripheral neuropathy - Hypoglycemic protocol - Hold amaryl - Continue gabapentin - CBGs every before meals and at bedtime with sensitive SSI  Hypothyroidism - Continue levothyroxine   Chronic kidney disease stage III: Initial creatinine at outside facility was noted to be 1.3 appears baseline anywhere from 1.4-1.8 per records available. - Held Voltaren - Continue to monitor  BPH - Continue Flomax   DVT prophylaxis:Lovenox  Code Status: full  Family Communication: No family present at bedside  Disposition Plan: Likely discharge home in 2-3 days  Consults called: none Admission status: Inpatient  Norval Morton MD [facility] Pager [phone]  If 7PM-7AM, please contact night-coverage [email or web address] Password [facility]  03/26/2017, 10:51 PM

## 2017-03-26 NOTE — Progress Notes (Signed)
Patient received from [facility] on BiPAP, patient placed on BiPAP and tolerating well at this time. RT will continue to monitor.

## 2017-03-26 NOTE — Progress Notes (Signed)
71 y.o. male chronic diastolic HF, chronic renal insufficieny, HTN, hypothyroidism, OSA, former smoker and poorly controlled type 2 DM and recently worsening dyspnea , presented to [facility] emergency room for worsening dyspnea. Patient found to have,PNA , acute bronchitis, found to be tachypneic, tachycardic, hypertensive, increased work of breathing , placed on Bipap , at [facility] ED . Patient also have some concomitant CHF exacerbation. Patient accepted to stepdown at [facility]. Since there are no beds at [facility] the patient is being transferred to [facility]

## 2017-03-27 ENCOUNTER — Encounter (HOSPITAL_COMMUNITY): Payer: Self-pay | Admitting: General Practice

## 2017-03-27 DIAGNOSIS — J9601 Acute respiratory failure with hypoxia: Secondary | ICD-10-CM

## 2017-03-27 DIAGNOSIS — J181 Lobar pneumonia, unspecified organism: Secondary | ICD-10-CM

## 2017-03-27 DIAGNOSIS — I5033 Acute on chronic diastolic (congestive) heart failure: Secondary | ICD-10-CM

## 2017-03-27 LAB — COMPREHENSIVE METABOLIC PANEL
ALT: 22 U/L (ref 17–63)
ANION GAP: 10 (ref 5–15)
AST: 19 U/L (ref 15–41)
Albumin: 3.8 g/dL (ref 3.5–5.0)
Alkaline Phosphatase: 73 U/L (ref 38–126)
BILIRUBIN TOTAL: 0.9 mg/dL (ref 0.3–1.2)
BUN: 33 mg/dL — AB (ref 6–20)
CHLORIDE: 106 mmol/L (ref 101–111)
CO2: 24 mmol/L (ref 22–32)
Calcium: 9.3 mg/dL (ref 8.9–10.3)
Creatinine, Ser: 1.27 mg/dL — ABNORMAL HIGH (ref 0.61–1.24)
GFR, EST NON AFRICAN AMERICAN: 55 mL/min — AB (ref >60)
Glucose, Bld: 264 mg/dL — ABNORMAL HIGH (ref 65–99)
POTASSIUM: 4.3 mmol/L (ref 3.5–5.1)
Sodium: 140 mmol/L (ref 135–145)
TOTAL PROTEIN: 7.4 g/dL (ref 6.5–8.1)

## 2017-03-27 LAB — URINALYSIS, ROUTINE W REFLEX MICROSCOPIC
BACTERIA UA: NONE SEEN
BILIRUBIN URINE: NEGATIVE
Glucose, UA: >=500 mg/dL — AB
KETONES UR: NEGATIVE mg/dL
Nitrite: NEGATIVE
PROTEIN: NEGATIVE mg/dL
SQUAMOUS EPITHELIAL / LPF: NONE SEEN
Specific Gravity, Urine: 1.013 (ref 1.005–1.030)
pH: 5 (ref 5.0–8.0)

## 2017-03-27 LAB — LACTIC ACID, PLASMA
LACTIC ACID, VENOUS: 1.1 mmol/L (ref 0.5–1.9)
LACTIC ACID, VENOUS: 1.1 mmol/L (ref 0.5–1.9)

## 2017-03-27 LAB — GLUCOSE, CAPILLARY
GLUCOSE-CAPILLARY: 230 mg/dL — AB (ref 65–99)
GLUCOSE-CAPILLARY: 251 mg/dL — AB (ref 65–99)
GLUCOSE-CAPILLARY: 185 mg/dL — AB (ref 65–99)
Glucose-Capillary: 208 mg/dL — ABNORMAL HIGH (ref 65–99)

## 2017-03-27 LAB — CBC WITH DIFFERENTIAL/PLATELET
Basophils Absolute: 0 10*3/uL (ref 0.0–0.1)
Basophils Relative: 0 %
EOS PCT: 0 %
Eosinophils Absolute: 0 10*3/uL (ref 0.0–0.7)
HCT: 40.9 % (ref 39.0–52.0)
HEMOGLOBIN: 13.1 g/dL (ref 13.0–17.0)
LYMPHS ABS: 0.4 10*3/uL — AB (ref 0.7–4.0)
Lymphocytes Relative: 3 %
MCH: 29.8 pg (ref 26.0–34.0)
MCHC: 32 g/dL (ref 30.0–36.0)
MCV: 93.2 fL (ref 78.0–100.0)
MONO ABS: 0.4 10*3/uL (ref 0.1–1.0)
MONOS PCT: 3 %
Neutro Abs: 13.6 10*3/uL — ABNORMAL HIGH (ref 1.7–7.7)
Neutrophils Relative %: 94 %
Platelets: 146 10*3/uL — ABNORMAL LOW (ref 150–400)
RBC: 4.39 MIL/uL (ref 4.22–5.81)
RDW: 14.7 % (ref 11.5–15.5)
WBC: 14.3 10*3/uL — ABNORMAL HIGH (ref 4.0–10.5)

## 2017-03-27 LAB — PROTIME-INR
INR: 1.09
PROTHROMBIN TIME: 14.1 s (ref 11.4–15.2)

## 2017-03-27 LAB — MRSA PCR SCREENING: MRSA by PCR: NEGATIVE

## 2017-03-27 LAB — TSH: TSH: 0.465 u[IU]/mL (ref 0.350–4.500)

## 2017-03-27 LAB — STREP PNEUMONIAE URINARY ANTIGEN: Strep Pneumo Urinary Antigen: NEGATIVE

## 2017-03-27 LAB — BRAIN NATRIURETIC PEPTIDE: B Natriuretic Peptide: 265.1 pg/mL — ABNORMAL HIGH (ref 0.0–100.0)

## 2017-03-27 LAB — TROPONIN I: Troponin I: 0.05 ng/mL (ref <0.03)

## 2017-03-27 LAB — PROCALCITONIN: PROCALCITONIN: 0.24 ng/mL

## 2017-03-27 LAB — APTT: aPTT: 29 seconds (ref 24–36)

## 2017-03-27 MED ORDER — FESOTERODINE FUMARATE ER 8 MG PO TB24
8.0000 mg | ORAL_TABLET | Freq: Every day | ORAL | Status: DC
  Administered 2017-03-27 – 2017-04-01 (×6): 8 mg via ORAL
  Filled 2017-03-27 (×6): qty 1

## 2017-03-27 MED ORDER — ASPIRIN EC 81 MG PO TBEC
81.0000 mg | DELAYED_RELEASE_TABLET | Freq: Every day | ORAL | Status: DC
  Administered 2017-03-27 – 2017-04-01 (×6): 81 mg via ORAL
  Filled 2017-03-27 (×6): qty 1

## 2017-03-27 MED ORDER — TAMSULOSIN HCL 0.4 MG PO CAPS
0.4000 mg | ORAL_CAPSULE | Freq: Every day | ORAL | Status: DC
  Administered 2017-03-27 – 2017-04-01 (×6): 0.4 mg via ORAL
  Filled 2017-03-27 (×6): qty 1

## 2017-03-27 MED ORDER — ESCITALOPRAM OXALATE 10 MG PO TABS
10.0000 mg | ORAL_TABLET | Freq: Every day | ORAL | Status: DC
  Administered 2017-03-27 – 2017-04-01 (×6): 10 mg via ORAL
  Filled 2017-03-27 (×7): qty 1

## 2017-03-27 MED ORDER — ORAL CARE MOUTH RINSE
15.0000 mL | Freq: Two times a day (BID) | OROMUCOSAL | Status: DC
  Administered 2017-03-28 – 2017-03-31 (×4): 15 mL via OROMUCOSAL

## 2017-03-27 MED ORDER — LORAZEPAM 0.5 MG PO TABS
0.5000 mg | ORAL_TABLET | ORAL | Status: DC | PRN
  Administered 2017-03-31: 0.5 mg via ORAL
  Filled 2017-03-27: qty 1
  Filled 2017-03-27: qty 2

## 2017-03-27 MED ORDER — PRAMIPEXOLE DIHYDROCHLORIDE 1 MG PO TABS
0.5000 mg | ORAL_TABLET | Freq: Two times a day (BID) | ORAL | Status: DC
  Administered 2017-03-27 – 2017-04-01 (×11): 0.5 mg via ORAL
  Filled 2017-03-27 (×11): qty 1

## 2017-03-27 MED ORDER — BACLOFEN 10 MG PO TABS
10.0000 mg | ORAL_TABLET | Freq: Three times a day (TID) | ORAL | Status: DC
  Administered 2017-03-27 – 2017-04-01 (×16): 10 mg via ORAL
  Filled 2017-03-27 (×16): qty 1

## 2017-03-27 MED ORDER — ZOLPIDEM TARTRATE 5 MG PO TABS
5.0000 mg | ORAL_TABLET | Freq: Once | ORAL | Status: AC | PRN
  Administered 2017-03-27: 5 mg via ORAL
  Filled 2017-03-27: qty 1

## 2017-03-27 MED ORDER — GABAPENTIN 400 MG PO CAPS
400.0000 mg | ORAL_CAPSULE | Freq: Three times a day (TID) | ORAL | Status: DC
  Administered 2017-03-27 – 2017-04-01 (×16): 400 mg via ORAL
  Filled 2017-03-27 (×16): qty 1

## 2017-03-27 MED ORDER — POLYETHYLENE GLYCOL 3350 17 G PO PACK: 17.0000 g | PACK | Freq: Every day | ORAL | Status: DC | PRN

## 2017-03-27 MED ORDER — LOSARTAN POTASSIUM 50 MG PO TABS
50.0000 mg | ORAL_TABLET | Freq: Every day | ORAL | Status: DC
  Administered 2017-03-27 – 2017-04-01 (×6): 50 mg via ORAL
  Filled 2017-03-27 (×6): qty 1

## 2017-03-27 MED ORDER — CHLORHEXIDINE GLUCONATE 0.12 % MT SOLN
15.0000 mL | Freq: Two times a day (BID) | OROMUCOSAL | Status: DC
  Administered 2017-03-27 – 2017-04-01 (×6): 15 mL via OROMUCOSAL
  Filled 2017-03-27 (×9): qty 15

## 2017-03-27 MED ORDER — INSULIN ASPART 100 UNIT/ML ~~LOC~~ SOLN
0.0000 [IU] | Freq: Three times a day (TID) | SUBCUTANEOUS | Status: DC
  Administered 2017-03-27: 2 [IU] via SUBCUTANEOUS
  Administered 2017-03-27: 5 [IU] via SUBCUTANEOUS
  Administered 2017-03-27 – 2017-03-28 (×2): 2 [IU] via SUBCUTANEOUS
  Administered 2017-03-28: 3 [IU] via SUBCUTANEOUS
  Administered 2017-03-28 – 2017-03-29 (×3): 2 [IU] via SUBCUTANEOUS
  Administered 2017-03-29: 3 [IU] via SUBCUTANEOUS
  Administered 2017-03-30 (×2): 2 [IU] via SUBCUTANEOUS
  Administered 2017-03-30 – 2017-03-31 (×3): 3 [IU] via SUBCUTANEOUS
  Administered 2017-03-31: 1 [IU] via SUBCUTANEOUS
  Administered 2017-04-01: 3 [IU] via SUBCUTANEOUS

## 2017-03-27 MED ORDER — SALINE SPRAY 0.65 % NA SOLN
1.0000 | NASAL | Status: DC | PRN
  Administered 2017-03-27 – 2017-03-29 (×2): 2 via NASAL
  Filled 2017-03-27 (×2): qty 44

## 2017-03-27 MED ORDER — HYDRALAZINE HCL 25 MG PO TABS
25.0000 mg | ORAL_TABLET | Freq: Three times a day (TID) | ORAL | Status: DC
  Administered 2017-03-27 – 2017-04-01 (×16): 25 mg via ORAL
  Filled 2017-03-27 (×16): qty 1

## 2017-03-27 MED ORDER — LEVOTHYROXINE SODIUM 75 MCG PO TABS
175.0000 ug | ORAL_TABLET | Freq: Every day | ORAL | Status: DC
  Administered 2017-03-27 – 2017-04-01 (×6): 175 ug via ORAL
  Filled 2017-03-27 (×6): qty 1

## 2017-03-27 NOTE — Consult Note (Signed)
CARDIOLOGY CONSULT NOTE   Patient ID: Travis [name] MRN: [id number] DOB/AGE: [birth year] 73 y.o.  Admit date: 03/26/2017  Primary Physician   Simona Huh, MD Primary Cardiologist   Dr. Acie Fredrickson Reason for Consultation   CHF Requesting Physician  Dr. Carolin Sicks   HPI: Travis [name] is a 71 y.o. male with a history of chronic diastolic HF, chronic renal insufficieny, HTN, hypothyroidism, OSA, former smoker and poorly controlled type 2 DM   He is the father - in -law of Travis [name] (CCU nurse) and Travis [name] ([place]). He had a [facility] 08/2015 which showed mild-moderate nonobstructive CAD and mild pulmonary HTN with normal LV filling pressures.He was seen 3 times this year in a clinic for dyspnea on exertion. He felt better on sort term increase in Torsemide. Last seen by me 02/18/17. He was doing well.  Last echo 03/06/17 showed left ventricular function of 65-70%, mild LVH, grade 2 diastolic dysfunction, mildly dilated left atrium.  He is doing well up until 2 weeks ago when he noted dyspnea on exertion. He says he has gained 5 pounds since last visit. Reviewing records is this he came at least 30 pounds. His weight was 299 on 2/21. Today 232lb. complains of abdominal tightness. No orthopnea or PND. He is not compliant with diet. Limited mobility due to chronic knee and back issue. He takes torsemide 20 mg twice a day.  He went to work yesterday and felt sick. He came back home and noted severe progressive worsening of shortness of breath, fever, chills and vomiting. EMS was called and taken to [facility], [place]. Workup revealed acute respiratory failure with hypoxia in setting of community acquired pneumonia. He was given antibiotic and transferred to [facility] for further evaluation. BNP 265. Serum creatinine 1.27. Blood sugar running high. Chest x-ray shows possible left lung base consolidation. He was started on IV Lasix 40 mg twice a day. Noted improvement in his  breathing.  Past Medical History:  Diagnosis Date  . Cervical myelopathy (HCC)    with myelomalacia at C5-6 and C3-4  . CHF (congestive heart failure) ([place])   . Constipation - functional    controlled with miralax  . DJD (degenerative joint disease)    WHOLE SPINE  . Gait disorder   . Heart murmur    Evaluated by cardiologist 4-5 years ago but does not currently see a cardiologist  . Hypertension   . Hyperthyroidism    Had Iodine to resolve issue DX 10 YR AGO  . Hypothyroidism    Low d/t treatment of hyperthyroidism  . 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 Peripheral neuropathy ([place])   . Pneumonia   . PONV (postoperative nausea and vomiting)   . RBBB   . Renal disorder    acute kidney failure  . Restless legs syndrome 03/14/2015  . RLS (restless legs syndrome)   . Sleep apnea    DX  2009/2010 study done at [facility]  . 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 Type II diabetes mellitus ([place])    diet controlled     Past Surgical History:  Procedure Laterality Date  . BACK SURGERY     6 surgeries  . CARDIAC CATHETERIZATION [name] 09/07/2015   Procedure: Right/Left Heart Cath and Coronary Angiography;  Surgeon: Peter M Martinique, MD;  Location: [facility];  Service: Cardiovascular;  Laterality: [name];  . CHOLECYSTECTOMY    . LUMBAR LAMINECTOMY/DECOMPRESSION MICRODISCECTOMY  07/14/2012   Procedure:  LUMBAR LAMINECTOMY/DECOMPRESSION MICRODISCECTOMY 2 LEVELS;  Surgeon: Eustace Moore, MD;  Location: [facility];  Service: Neurosurgery;  Laterality: Left;  Lumbar two three laminectomy, left thoracic four five transpedicular diskectomy    Allergies  Allergen Reactions  . Other Other (See Comments)    Anesthesia makes sick, must have antinausea medication in advance.    I have reviewed the patient's current medications . aspirin EC  81 mg Oral Daily  . azithromycin  500 mg Intravenous Q24H  . baclofen  10 mg Oral TID  . cefTRIAXone (ROCEPHIN)  IV  1 g Intravenous Q24H  .  enoxaparin (LOVENOX) injection  40 mg Subcutaneous Daily  . escitalopram  10 mg Oral Daily  . fesoterodine  8 mg Oral Daily  . furosemide  40 mg Intravenous BID  . gabapentin  400 mg Oral TID  . guaiFENesin  600 mg Oral BID  . hydrALAZINE  25 mg Oral Q8H  . insulin aspart  0-9 Units Subcutaneous TID WC  . levothyroxine  175 mcg Oral QAC breakfast  . pramipexole  0.5 mg Oral BID  . tamsulosin  0.4 mg Oral Daily    acetaminophen **OR** acetaminophen, ipratropium-albuterol, ondansetron **OR** ondansetron (ZOFRAN) IV, polyethylene glycol  Prior to Admission medications   Medication Sig Start Date End Date Taking? Authorizing Provider  aspirin EC 81 MG EC tablet Take 1 tablet (81 mg total) by mouth daily. 08/23/15  Yes Thurnell Lose, MD  baclofen (LIORESAL) 10 MG tablet Take 1 tablet (10 mg total) by mouth 3 (three) times daily. For muscle spasms 10/30/14  Yes Meredith Staggers, MD  diclofenac (VOLTAREN) 75 MG EC tablet Take 75 mg by mouth daily. 07/14/15  Yes Historical Provider, MD  escitalopram (LEXAPRO) 10 MG tablet Take 10 mg by mouth daily. 07/12/15  Yes Historical Provider, MD  gabapentin (NEURONTIN) 400 MG capsule TAKE 1 CAPSULE (400 MG TOTAL) BY MOUTH EVERY 8 (EIGHT) HOURS. 04/16/15  Yes Meredith Staggers, MD  glimepiride (AMARYL) 2 MG tablet Take 2 mg by mouth daily with breakfast.   Yes Historical Provider, MD  hydrALAZINE (APRESOLINE) 25 MG tablet Take 1 tablet (25 mg total) by mouth every 8 (eight) hours. 08/23/15  Yes Thurnell Lose, MD  levothyroxine (SYNTHROID, LEVOTHROID) 175 MCG tablet Take 175 mcg by mouth daily. 08/12/15  Yes Historical Provider, MD  losartan (COZAAR) 50 MG tablet TAKE 1 TABLET BY MOUTH ONCE DAILY. 09/30/16  Yes [name], MD  polyethylene glycol [facility] / Floria Raveling) packet Take 17 g by mouth daily as needed. For constipation Patient taking differently: Take 17 g by mouth daily as needed for moderate constipation.  08/09/14  Yes Ivan Anchors Love, [name]    potassium chloride (KLOR-CON 10) 10 MEQ tablet Take 1 tablet (10 mEq total) by mouth 2 (two) times daily. 09/25/15  Yes [name], MD  pramipexole (MIRAPEX) 0.5 MG tablet Take 0.5 mg by mouth 2 (two) times daily.  08/09/14  Yes Ivan Anchors Love, [name]  tamsulosin (FLOMAX) 0.4 MG CAPS capsule Take 0.4 mg by mouth daily.  09/13/14  Yes Historical Provider, MD  torsemide (DEMADEX) 20 MG tablet Take 1 tablet (20 mg total) by mouth 2 (two) times daily. Take additional 10 mg as needed for weight gain or shortness of breath. 02/18/17  Yes Bhavinkumar Bhagat, PA  TOVIAZ 8 MG TB24 tablet Take 8 mg by mouth daily. 08/07/15  Yes Historical Provider, MD     Social History   Social History  .  Marital status: Married    Spouse name: [name]  . Number of children: 3  . Years of education: 23   Occupational History  .  [employer]    Works in [place] History Main Topics  . Smoking status: Former Smoker    Packs/day: 2.00    Years: 20.00    Types: Cigarettes    Quit date: 06/28/1985  . Smokeless tobacco: Former Systems developer    Quit date: 06/28/1985  . Alcohol use No  . Drug use: No  . Sexual activity: Not on file   Other Topics Concern  . Not on file   Social History Narrative   Patient is married with 3 children.   Patient has 16 yrs of education.   Patient is right handed.   Patient drinks 2 cups of caffeine per day.       Family Status  Relation Status  . Mother Deceased at age 70   Blood disorder  . Father Deceased at age 73  . Brother Alive   Good health  . Brother Alive   Good health  . Brother Alive   Good health  . Brother Alive   Good health  . Maternal Grandmother Deceased  . Maternal Grandfather Deceased  . Paternal Grandmother Deceased  . Paternal Grandfather Deceased  .     Family History  Problem Relation Age of Onset  . Polycythemia Mother   . Diabetes Father   . Hypertension Father   . Heart disease Father   . CAD      1 of his 4  brothers and father has CAD    ROS:  Full 14 point review of systems complete and found to be negative unless listed above.  Physical Exam: Blood pressure (!) 155/66, pulse 88, temperature 98.9 F (37.2 C), temperature source Oral, resp. rate (!) 21, weight 232 lb 8 oz (105.5 kg), SpO2 96 %.  General: Well developed, well nourished, male in no acute distress Head: Eyes PERRLA, No xanthomas. Normocephalic and atraumatic, oropharynx without edema or exudate.  Lungs: Resp regular and unlabored, Diminished breath sound with faint rales. Heart: RRR no s3, s4, or murmurs. Neck: No carotid bruits. No lymphadenopathy.  + JVD. Abdomen: Bowel sounds present, distended abdomen Msk:  No spine or cva tenderness. No weakness, no joint deformities or effusions. Extremities: No clubbing, cyanosis. 2+ BL LE  edema. DP/PT/Radials 2+ and equal bilaterally. Neuro: Alert and oriented X 3. No focal deficits noted. Psych:  Good affect, responds appropriately Skin: No rashes or lesions noted.  Labs:   Lab Results  Component Value Date   WBC 14.3 (H) 03/26/2017   HGB 13.1 03/26/2017   HCT 40.9 03/26/2017   MCV 93.2 03/26/2017   PLT 146 (L) 03/26/2017    Recent Labs  03/26/17 2339  INR 1.09    Recent Labs Lab 03/26/17 2339  NA 140  K 4.3  CL 106  CO2 24  BUN 33*  CREATININE 1.27*  CALCIUM 9.3  PROT 7.4  BILITOT 0.9  ALKPHOS 73  ALT 22  AST 19  GLUCOSE 264*  ALBUMIN 3.8   Magnesium  Date Value Ref Range Status  08/23/2015 2.2 1.7 - 2.4 mg/dL Final    Recent Labs  03/26/17 2339  TROPONINI 0.05*   No results for input(s): TROPIPOC in the last 72 hours. Pro B Natriuretic peptide (BNP)  Date/Time Value Ref Range Status  07/10/2014 09:05 PM 1,750.0 (H) 0 - 125 pg/mL Final  07/10/2014 12:55 PM 142.2 (H) 0 - 125 pg/mL Final   NT-Pro BNP  Date/Time Value Ref Range Status  02/12/2017 03:22 PM 109 0 - 376 pg/mL Final    Comment:    The following cut-points have been suggested for  the use of proBNP for the diagnostic evaluation of heart failure (HF) in patients with acute dyspnea: Modality                     Age           Optimal Cut                            (years)            Point ------------------------------------------------------ Diagnosis (rule in HF)        <50            450 pg/mL                           50 - 75            900 pg/mL                               >75           1800 pg/mL Exclusion (rule out HF)  Age independent     300 pg/mL    Lab Results  Component Value Date   CHOL 124 08/20/2015   HDL 29 (L) 08/20/2015   LDLCALC 79 08/20/2015   TRIG 82 08/20/2015   No results found for: DDIMER No results found for: LIPASE, AMYLASE TSH  Date/Time Value Ref Range Status  08/20/2015 10:30 AM 0.299 (L) 0.350 - 4.500 uIU/mL Final  09/21/2014 03:11 PM 2.17 0.35 - 4.50 uIU/mL Final   No results found for: VITAMINB12, FOLATE, FERRITIN, TIBC, IRON, RETICCTPCT  Echo: 03/06/17 ------------------------------------------------------------------- Study Conclusions  - Left ventricle: The cavity size was normal. Wall thickness was   increased in a pattern of mild LVH. Systolic function was   vigorous. The estimated ejection fraction was in the range of 65%   to 70%. Wall motion was normal; there were no regional wall   motion abnormalities. Doppler parameters are consistent with   pseudonormal left ventricular relaxation (grade 2 diastolic   dysfunction). The E/A ratio is >1.5. The E/e&' ratio is >15,   suggesting elevated LV filling pressure. - Left atrium: The atrium was mildly dilated. - Inferior vena cava: The vessel was normal in size. The   respirophasic diameter changes were in the normal range (>= 50%),   consistent with normal central venous pressure.  Impressions:  - Compared to a prior study in 2016, there is no significant   change. LV filling pressure is elevated.  Radiology:  Dg Chest Port 1 View  Result Date:  03/26/2017 CLINICAL DATA:  71 y/o  M; sepsis on BiPAP. EXAM: PORTABLE CHEST 1 VIEW COMPARISON:  08/23/2015 chest radiograph. FINDINGS: Low lung volumes accentuate pulmonary markings. Stable cardiac silhouette given projection and technique. Aortic atherosclerosis with calcification. Bibasilar streaky opacities probably represent atelectasis. Possible left lung base consolidation. No pleural effusion or pneumothorax. Cervical and thoracic spinal fusion hardware noted. No acute osseous abnormality is evident. IMPRESSION: Low lung volumes and bibasilar atelectasis. Left lung base consolidation is possible. Electronically Signed   By: Kristine Garbe  M.D.   On: 03/26/2017 23:41    ASSESSMENT AND PLAN:     1. Acute on chronic diastolic heart failure - He has gained approximately 30 pounds in past 1 month basis on our records in setting of dietary noncompliance. Seems baseline weight is around 295-300 LB. - Last echocardiogram was from 03/06/2017 with EF of 65-70% and grade 2 diastolic dysfunction - BNP 265.  He was started on IV Lasix 40 mg twice a day. Breathing improved. Net I & O negative 1.6L. Continue current dose of lasix. Up titrate as needed. Try compression stocking. He need to loose weight and eat low sodium diet.   2. HTN - Elevated.   3. Non obstructive CAD - No angina. Continue ASA.   4. Chronic Renal Insufficiency, stage III - SCr baseline around 1.4-1.6. Follow closely with diuresis.   SignedLeanor Kail, PA 03/27/2017, 12:18 PM Pager [phone]  Patient seen and examined and history reviewed. Agree with above findings and plan. 71 yo WM with long history of diastolic CHF. Admitted with CAP with fever, cough, N/V and chills. Along with this he has increased edema and significant weight gain since last OV. Today states breathing is much improved. He is aware that he is eating too much salt but hasn't changed eating habits so far.  On exam he is an obese WM in NAD. JVD  to 6 cm Lungs are clear. CV RRR without gallop or murmur  2+ edema  Ecg shows NSR with RBBB. I have personally reviewed and interpreted this study. CXR with LLL infiltrate BNP 265  Impression: 1. CAP improved with antibiotic therapy 2. Acute on chronic diastolic dysfunction. Diuresing Ok with lasix 40 mg IV bid. Will continue for now. Recent echo unremarkable except for diastolic dysfunction. Stressed importance of sodium restriction. Monitor renal function.   Peter Martinique, [place] 03/27/2017 12:58 PM

## 2017-03-27 NOTE — Progress Notes (Signed)
RT set up bipap and placed nasal mask on pt. Pt is resting comfortably at this time with 2L O2 bled into circuit. RT will monitor as needed.

## 2017-03-27 NOTE — Evaluation (Signed)
Physical Therapy Evaluation Patient Details Name: Travis [name] MRN: [id number] DOB: December 20, 1946 Today's Date: 03/27/2017   History of Present Illness  Pt is a 71 y.o. male admitted to ED on 03/26/17 with worsening SOB, fever, chills and vomiting; workup revealed acute resp failure with CAP. CXR shows low lung volumes and bibasilar atelectasis. Pt being seen at OP neuro PT from 3/9-3/23 for gait mechanics and balance. Pertinent PMH includes CHF, chronic renal insufficiency, HTN, OSA, poorly controlled DM, cervical myelopathy.   Clinical Impression  Pt presents to PT with generalized weakness, impaired balance, dyspnea with exertion, and an overall decrease in functional mobility secondary to above. PTA, pt living at home with wife, requiring assist from her to stand and perform some ADLs; amb household distances mod indep with RW. Today, pt able to stand with RW and maxA +2 and amb 50' with min guard (recommend chair follow); 2x standing rest breaks secondary to dyspnea 3/4 with amb. Pt and wife educ on recommendation for continued rehab (recommend SNF) upon d/c although pt hopes to return home with HHPT. Pt would benefit from continued acute PT services to maximize functional mobility and independence.    Follow Up Recommendations SNF;Supervision for mobility/OOB    Equipment Recommendations  None recommended by PT    Recommendations for Other Services OT consult     Precautions / Restrictions Precautions Precautions: Fall Restrictions Weight Bearing Restrictions: No      Mobility  Bed Mobility               General bed mobility comments: Pt received sitting in chair; reports assist +2 to get him there  Transfers Overall transfer level: Needs assistance Equipment used: Rolling walker (2 wheeled) Transfers: Sit to/from Stand Sit to Stand: +2 physical assistance;Max assist         General transfer comment: Pt able to stand on second attempt with RW and maxA +2, relying  on pushing with RUE from bed rail.   Ambulation/Gait Ambulation/Gait assistance: Min guard Ambulation Distance (Feet): 50 Feet Assistive device: Rolling walker (2 wheeled) Gait Pattern/deviations: Step-to pattern;Decreased stride length Gait velocity: Decreased Gait velocity interpretation: Below normal speed for age/gender General Gait Details: Slowed gait with min guard for balance and chair follow; 2x standing rest breaks secondary to increased SOB. Dyspnea 3/4 with walking. Decreased foot clearance of LLE (pt reports receiving bilat AFOs from OPPT to start using).   Stairs            Wheelchair Mobility    Modified Rankin (Stroke Patients Only)       Balance Overall balance assessment: Needs assistance Sitting-balance support: No upper extremity supported;Feet supported Sitting balance-Leahy Scale: Fair       Standing balance-Leahy Scale: Poor Standing balance comment: Reliant on BUE support for balance                             Pertinent Vitals/Pain Pain Assessment: No/denies pain    Home Living Family/patient expects to be discharged to:: Private residence Living Arrangements: Spouse/significant other Available Help at Discharge: Family;Available 24 hours/day (Wife) Type of Home: House Home Access: Ramped entrance     Home Layout: One level Home Equipment: [place] - 2 wheels;Walker - 4 wheels;Cane - single point;Bedside commode;Wheelchair - manual      Prior Function Level of Independence: Needs assistance   Gait / Transfers Assistance Needed: Amb with RW. Requires assist from wife to stand from chair. Reports OP  PT has ordered him bilat AFOs to start wearing. Uses CPAP at night.   ADL's / Homemaking Assistance Needed: Intermitting assist for dressing from wife.         Hand Dominance        Extremity/Trunk Assessment   Upper Extremity Assessment Upper Extremity Assessment: Generalized weakness    Lower Extremity  Assessment Lower Extremity Assessment: Generalized weakness (Bilat ankle/foot pitting edema 2+)    Cervical / Trunk Assessment Cervical / Trunk Assessment: Kyphotic  Communication   Communication: No difficulties  Cognition Arousal/Alertness: Awake/alert Behavior During Therapy: WFL for tasks assessed/performed Overall Cognitive Status: Within Functional Limits for tasks assessed                                        General Comments General comments (skin integrity, edema, etc.): SpO2 remained >94% on 3L O2 [place].     Exercises     Assessment/Plan    PT Assessment Patient needs continued PT services  PT Problem List Decreased strength;Decreased activity tolerance;Decreased balance;Decreased mobility;Cardiopulmonary status limiting activity       PT Treatment Interventions Gait training;Stair training;Functional mobility training;Therapeutic activities;Therapeutic exercise;Balance training;Patient/family education    PT Goals (Current goals can be found in the Care Plan section)  Acute Rehab PT Goals Patient Stated Goal: Return home PT Goal Formulation: With patient Time For Goal Achievement: 04/10/17 Potential to Achieve Goals: Fair    Frequency Min 3X/week   Barriers to discharge Decreased caregiver support Wife unable to provide physical assist that pt requires to stand    Co-evaluation               End of Session Equipment Utilized During Treatment: Gait belt;Oxygen Activity Tolerance: Patient tolerated treatment well;Patient limited by fatigue Patient left: in chair;with call bell/phone within reach;with family/visitor present;with nursing/sitter in room Nurse Communication: Mobility status PT Visit Diagnosis: Muscle weakness (generalized) (M62.81);Other abnormalities of gait and mobility (R26.89)    Time: 1224-8250 PT Time Calculation (min) (ACUTE ONLY): 16 min   Charges:   PT Evaluation $PT Eval Low Complexity: 1 Procedure     PT  G Codes:       [name], SPT [phone]  [name] 03/27/2017, 4:25 PM

## 2017-03-27 NOTE — Progress Notes (Signed)
Pt feeling very anxious tonight. Unable to tolerate CPAP tonight after two attempts. RT evaluated pt further but pt unable to breathe well with CPAP. Pt has been placed on humidified O2 via [place]. Pt also requesting something for anxiety and/or sleep. [facility] paged, awaiting response. Will continue to monitor and assess.  Jacqlyn Larsen, RN

## 2017-03-27 NOTE — Progress Notes (Addendum)
PROGRESS NOTE    Travis [name]  ZOX:[id number] DOB: 1946/04/18 DOA: 03/26/2017 PCP: Simona Huh, MD   Brief Narrative: 71 y.o. male with medical history significant of HTN, chronic diastolic CHF and EF 81-19% in 02/2017, CKD stage III, DM type 2, who presented to [facility] with shortness of breath and cough and fever. In the ER patient was found to havefever up to 102.44F, pulse 120, respirations 30, O2 saturations 89%, blood pressure 206/95. Patient was required placement to BiPAP with some improvement in respiratory distress. Chest x-ray showed low lung volumes with minimal bibasilar atelectasis. Influenza screen negative. Patient was started empirically on antibiotics of ceftriaxone and azithromycin.Family requested transfer to [facility] as patient's doctors are here.Followed by Dr. Cathie Olden of cardiology.  Assessment & Plan:   # Acute on chronic diastolic congestive heart failure: -Clinically improving, off BiPAP this morning. Continue IV Lasix, resume losartan -Monitor BMP, strict ins and outs, daily weight -Cardiology consulted -Echocardiogram from 03/06/2017 with EF of 65-70% and grade 2 diastolic dysfunction. BNP 265 on admission.  #Possible community acquired pneumonia: Continue ceftriaxone and azithromycin. Respiratory symptoms improving. -Influenza negative at outside hospital. -Patient is afebrile.  #Acute hypoxic respiratory failure: Off BiPAP and is requiring 2 L of oxygen. Continue bronchodilators and breathing treatment. Try to wean down oxygen to room air as tolerated.  #Hypertensive urgency: Monitor blood pressure. Continue Lasix, hydralazine. Resume losartan.  #Chronic kidney disease stage III: Serum creatinine level below baseline. Continue to monitor BMP.  # type 2 DM: monitor blood sugar level. On insulin sliding scale.   #Nonobstructive coronary artery disease: No angina. Continue aspirin.  Principal Problem:   Acute respiratory failure with hypoxia  (HCC) Active Problems:   CAP (community acquired pneumonia)    Type II diabetes mellitus ([place])  DVT prophylaxis: Lovenox subcutaneous Code Status: Full code Family Communication: Discussed with the patient's wife and daughter-in-law who is also RN@[facility] . Disposition Plan: Likely discharge home versus rehabilitation in 1-2 days. PT OT evaluation.    Consultants:   Cardiology  Procedures: None Antimicrobials: Ceftriaxone and azithromycin since 3/30  Subjective: Patient was seen and examined at bedside. Shortness of breath better and has dry cough. Denied chest pain and no fever, chills, nausea or vomiting. Wife at bedside. Patient's daughter-in-law who is RN at [address] also presented at bedside.  Objective: Vitals:   03/27/17 0500 03/27/17 0742 03/27/17 1000 03/27/17 1436  BP: (!) 158/65 (!) 146/63 (!) 155/66 (!) 151/63  Pulse: (!) 59 (!) 59 88 82  Resp: (!) 21 20 (!) 21 (!) 26  Temp: 98.9 F (37.2 C) 98.9 F (37.2 C)  97.5 F (36.4 C)  TempSrc: Oral Oral  Oral  SpO2: 99% 100% 96% 96%  Weight: 105.5 kg (232 lb 8 oz)       Intake/Output Summary (Last 24 hours) at 03/27/17 1514 Last data filed at 03/27/17 1359  Gross per 24 hour  Intake             1080 ml  Output             1875 ml  Net             -795 ml   Filed Weights   03/27/17 0100 03/27/17 0500  Weight: 104.2 kg (229 lb 12.8 oz) 105.5 kg (232 lb 8 oz)    Examination:  General exam: Appears calm and comfortable, Sitting on chair comfortable Respiratory system: Bibasal crackles. Respiratory effort normal. No wheezing  Cardiovascular system: S1 &  S2 heard, RRR.   Gastrointestinal system: Abdomen is soft, nontender. Bowel sound positive Central nervous system: Alert and oriented. No focal neurological deficits. Extremities: Symmetric 5 x 5 power. Lower extremities edema Skin: No rashes, lesions or ulcers Psychiatry: Judgement and insight appear normal. Mood & affect appropriate.     Data Reviewed: I have  personally reviewed following labs and imaging studies  CBC:  Recent Labs Lab 03/26/17 2339  WBC 14.3*  NEUTROABS 13.6*  HGB 13.1  HCT 40.9  MCV 93.2  PLT 540*   Basic Metabolic Panel:  Recent Labs Lab 03/26/17 2339  NA 140  K 4.3  CL 106  CO2 24  GLUCOSE 264*  BUN 33*  CREATININE 1.27*  CALCIUM 9.3   GFR: Estimated Creatinine Clearance: 67 mL/min (A) (by C-G formula based on SCr of 1.27 mg/dL (H)). Liver Function Tests:  Recent Labs Lab 03/26/17 2339  AST 19  ALT 22  ALKPHOS 73  BILITOT 0.9  PROT 7.4  ALBUMIN 3.8   No results for input(s): LIPASE, AMYLASE in the last 168 hours. No results for input(s): AMMONIA in the last 168 hours. Coagulation Profile:  Recent Labs Lab 03/26/17 2339  INR 1.09   Cardiac Enzymes:  Recent Labs Lab 03/26/17 2339  TROPONINI 0.05*   BNP (last 3 results)  Recent Labs  02/12/17 1522  PROBNP 109   HbA1C: No results for input(s): HGBA1C in the last 72 hours. CBG:  Recent Labs Lab 03/27/17 0829 03/27/17 1202  GLUCAP 230* 251*   Lipid Profile: No results for input(s): CHOL, HDL, LDLCALC, TRIG, CHOLHDL, LDLDIRECT in the last 72 hours. Thyroid Function Tests: No results for input(s): TSH, T4TOTAL, FREET4, T3FREE, THYROIDAB in the last 72 hours. Anemia Panel: No results for input(s): VITAMINB12, FOLATE, FERRITIN, TIBC, IRON, RETICCTPCT in the last 72 hours. Sepsis Labs:  Recent Labs Lab 03/26/17 2339 03/27/17 0219  PROCALCITON 0.24  --   LATICACIDVEN 1.1 1.1    Recent Results (from the past 240 hour(s))  MRSA PCR Screening     Status: None   Collection Time: 03/26/17  9:58 PM  Result Value Ref Range Status   MRSA by PCR NEGATIVE NEGATIVE Final    Comment:        The GeneXpert MRSA Assay (FDA approved for NASAL specimens only), is one component of a comprehensive MRSA colonization surveillance program. It is not intended to diagnose MRSA infection nor to guide or monitor treatment for MRSA  infections.          Radiology Studies: Dg Chest Port 1 View  Result Date: 03/26/2017 CLINICAL DATA:  71 y/o  M; sepsis on BiPAP. EXAM: PORTABLE CHEST 1 VIEW COMPARISON:  08/23/2015 chest radiograph. FINDINGS: Low lung volumes accentuate pulmonary markings. Stable cardiac silhouette given projection and technique. Aortic atherosclerosis with calcification. Bibasilar streaky opacities probably represent atelectasis. Possible left lung base consolidation. No pleural effusion or pneumothorax. Cervical and thoracic spinal fusion hardware noted. No acute osseous abnormality is evident. IMPRESSION: Low lung volumes and bibasilar atelectasis. Left lung base consolidation is possible. Electronically Signed   By: Kristine Garbe M.D.   On: 03/26/2017 23:41        Scheduled Meds: . aspirin EC  81 mg Oral Daily  . azithromycin  500 mg Intravenous Q24H  . baclofen  10 mg Oral TID  . cefTRIAXone (ROCEPHIN)  IV  1 g Intravenous Q24H  . enoxaparin (LOVENOX) injection  40 mg Subcutaneous Daily  . escitalopram  10 mg Oral Daily  .  fesoterodine  8 mg Oral Daily  . furosemide  40 mg Intravenous BID  . gabapentin  400 mg Oral TID  . guaiFENesin  600 mg Oral BID  . hydrALAZINE  25 mg Oral Q8H  . insulin aspart  0-9 Units Subcutaneous TID WC  . levothyroxine  175 mcg Oral QAC breakfast  . pramipexole  0.5 mg Oral BID  . tamsulosin  0.4 mg Oral Daily   Continuous Infusions:   LOS: 1 day    Dron Tanna Furry, MD [facility] Pager [phone]  If 7PM-7AM, please contact night-coverage [email or web address] Password [email or web address] 03/27/2017, 3:14 PM

## 2017-03-27 NOTE — Progress Notes (Signed)
Pt requesting nasal spray for congestion. [facility] paged, order received. Will continue to monitor.  Jacqlyn Larsen, RN

## 2017-03-27 NOTE — Progress Notes (Signed)
Took patient off of BIPAP placed on 2LNC SATS 92% patient tolerated well.

## 2017-03-28 LAB — CBC
HEMATOCRIT: 39.7 % (ref 39.0–52.0)
HEMOGLOBIN: 12.9 g/dL — AB (ref 13.0–17.0)
MCH: 30.4 pg (ref 26.0–34.0)
MCHC: 32.5 g/dL (ref 30.0–36.0)
MCV: 93.6 fL (ref 78.0–100.0)
Platelets: 162 10*3/uL (ref 150–400)
RBC: 4.24 MIL/uL (ref 4.22–5.81)
RDW: 15.4 % (ref 11.5–15.5)
WBC: 14.3 10*3/uL — ABNORMAL HIGH (ref 4.0–10.5)

## 2017-03-28 LAB — BASIC METABOLIC PANEL
ANION GAP: 10 (ref 5–15)
BUN: 45 mg/dL — ABNORMAL HIGH (ref 6–20)
CHLORIDE: 103 mmol/L (ref 101–111)
CO2: 28 mmol/L (ref 22–32)
Calcium: 9.2 mg/dL (ref 8.9–10.3)
Creatinine, Ser: 1.5 mg/dL — ABNORMAL HIGH (ref 0.61–1.24)
GFR calc non Af Amer: 45 mL/min — ABNORMAL LOW (ref >60)
GFR, EST AFRICAN AMERICAN: 52 mL/min — AB (ref >60)
Glucose, Bld: 211 mg/dL — ABNORMAL HIGH (ref 65–99)
POTASSIUM: 4 mmol/L (ref 3.5–5.1)
SODIUM: 141 mmol/L (ref 135–145)

## 2017-03-28 LAB — GLUCOSE, CAPILLARY
GLUCOSE-CAPILLARY: 170 mg/dL — AB (ref 65–99)
GLUCOSE-CAPILLARY: 213 mg/dL — AB (ref 65–99)
Glucose-Capillary: 187 mg/dL — ABNORMAL HIGH (ref 65–99)
Glucose-Capillary: 204 mg/dL — ABNORMAL HIGH (ref 65–99)

## 2017-03-28 LAB — LEGIONELLA PNEUMOPHILA SEROGP 1 UR AG: L. PNEUMOPHILA SEROGP 1 UR AG: NEGATIVE

## 2017-03-28 LAB — MAGNESIUM: MAGNESIUM: 2.3 mg/dL (ref 1.7–2.4)

## 2017-03-28 MED ORDER — LEVOFLOXACIN IN D5W 500 MG/100ML IV SOLN
500.0000 mg | INTRAVENOUS | Status: DC
  Administered 2017-03-28 – 2017-03-29 (×2): 500 mg via INTRAVENOUS
  Filled 2017-03-28 (×2): qty 100

## 2017-03-28 MED ORDER — IBUPROFEN 600 MG PO TABS
600.0000 mg | ORAL_TABLET | Freq: Once | ORAL | Status: AC
  Administered 2017-03-28: 600 mg via ORAL
  Filled 2017-03-28: qty 1

## 2017-03-28 NOTE — Progress Notes (Signed)
CCMD called to report pt had a run of SVT with HR reaching the 150's. Pt was alert, in bed resting, stated "the alarms woke me up". Pt denies CP or feeling his heart race. VSS. No other complaints from pt at this time. Will continue to monitor.  Jacqlyn Larsen, RN

## 2017-03-28 NOTE — Progress Notes (Addendum)
PROGRESS NOTE    Travis [name]  MPN:[id number] DOB: [birth year] DOA: 03/26/2017 PCP: Simona Huh, MD   Brief Narrative: 71 y.o. male with medical history significant of HTN, chronic diastolic CHF and EF 00-86% in 02/2017, CKD stage III, DM type 2, who presented to [facility] with shortness of breath and cough and fever. In the ER patient was found to havefever up to 102.72F, pulse 120, respirations 30, O2 saturations 89%, blood pressure 206/95. Patient was required placement to BiPAP with some improvement in respiratory distress. Chest x-ray showed low lung volumes with minimal bibasilar atelectasis. Influenza screen negative. Patient was started empirically on antibiotics of ceftriaxone and azithromycin. Family requested transfer to [facility] as patient's doctors are here.Followed by Dr. Cathie Olden of cardiology.  Assessment & Plan:   # Acute on chronic diastolic congestive heart failure: -Clinically improving, off BiPAP and on 2 liters. Continue IV Lasix,  losartan -Monitor BMP, strict ins and outs, daily weight -Cardiology consult appreciated. -Echocardiogram from 03/06/2017 with EF of 65-70% and grade 2 diastolic dysfunction. BNP 265 on admission.  #Possible community acquired pneumonia: Continue ceftriaxone and azithromycin. Respiratory symptoms improving. -Influenza negative at outside hospital. -Patient had temp 101 last night, afebrile this am. Cultures negative so far. Cough improved.  #Acute hypoxic respiratory failure: Off BiPAP and on2 L of oxygen. Continue bronchodilators and breathing treatment. Try to wean down oxygen to room air as tolerated. Discussed with patient's RN.  #Hypertensive: Monitor blood pressure. Continue Lasix, hydralazine,  losartan.  #Chronic kidney disease stage III: Serum creatinine mildly elevated but is still at baseline. Continue to monitor BMP.  # type 2 DM: monitor blood sugar level. On insulin sliding scale.   #Nonobstructive coronary  artery disease: No angina. Continue aspirin.  Principal Problem:   Acute respiratory failure with hypoxia (HCC) Active Problems:   CAP (community acquired pneumonia)    Type II diabetes mellitus ([place])  DVT prophylaxis: Lovenox subcutaneous Code Status: Full code Family Communication: No family present at bedside. Disposition Plan: Likely discharge home versus rehabilitation in 1-2 days. PT OT evaluation.    Consultants:   Cardiology  Procedures: None Antimicrobials: Ceftriaxone and azithromycin since 3/30  Subjective: Patient was seen and examined at bedside. Patient reported feeling much better. Denied shortness of breath, cough, chest pain, nausea or vomiting. He wants to go home rather than rehabilitation facility.  Objective: Vitals:   03/27/17 2314 03/27/17 2356 03/28/17 0352 03/28/17 0608  BP:  (!) 150/60 140/64 (!) 141/61  Pulse:  89 95   Resp:  (!) 25 (!) 25   Temp: 99.4 F (37.4 C) (!) 101.3 F (38.5 C) 98.2 F (36.8 C)   TempSrc: Oral Oral Oral   SpO2:  94% 91%   Weight:   127.9 kg (282 lb)   Height:        Intake/Output Summary (Last 24 hours) at 03/28/17 1113 Last data filed at 03/27/17 2051  Gross per 24 hour  Intake              940 ml  Output              900 ml  Net               40 ml   Filed Weights   03/27/17 0100 03/27/17 0500 03/28/17 0352  Weight: 104.2 kg (229 lb 12.8 oz) 105.5 kg (232 lb 8 oz) 127.9 kg (282 lb)    Examination:  General exam: Lying on bed comfortable, not in  distress Respiratory system: Clear bilateral. Respiratory effort normal. No wheezing  Cardiovascular system: Regular rate rhythm, S1 and S2 normal   Gastrointestinal system: Abdomen is soft, nontender. Bowel sound positive Central nervous system: Alert and oriented. No focal neurological deficits. Extremities: Symmetric 5 x 5 power. Still has bilateral lower extremities pitting edema. Skin: No rashes, lesions or ulcers Psychiatry: Judgement and insight appear  normal. Mood & affect appropriate.     Data Reviewed: I have personally reviewed following labs and imaging studies  CBC:  Recent Labs Lab 03/26/17 2339 03/28/17 0357  WBC 14.3* 14.3*  NEUTROABS 13.6*  --   HGB 13.1 12.9*  HCT 40.9 39.7  MCV 93.2 93.6  PLT 146* 810   Basic Metabolic Panel:  Recent Labs Lab 03/26/17 2339 03/28/17 0357  NA 140 141  K 4.3 4.0  CL 106 103  CO2 24 28  GLUCOSE 264* 211*  BUN 33* 45*  CREATININE 1.27* 1.50*  CALCIUM 9.3 9.2  MG  --  2.3   GFR: Estimated Creatinine Clearance: 62.4 mL/min (A) (by C-G formula based on SCr of 1.5 mg/dL (H)). Liver Function Tests:  Recent Labs Lab 03/26/17 2339  AST 19  ALT 22  ALKPHOS 73  BILITOT 0.9  PROT 7.4  ALBUMIN 3.8   No results for input(s): LIPASE, AMYLASE in the last 168 hours. No results for input(s): AMMONIA in the last 168 hours. Coagulation Profile:  Recent Labs Lab 03/26/17 2339  INR 1.09   Cardiac Enzymes:  Recent Labs Lab 03/26/17 2339  TROPONINI 0.05*   BNP (last 3 results)  Recent Labs  02/12/17 1522  PROBNP 109   HbA1C: No results for input(s): HGBA1C in the last 72 hours. CBG:  Recent Labs Lab 03/27/17 0829 03/27/17 1202 03/27/17 1628 03/27/17 2144 03/28/17 0733  GLUCAP 230* 251* 185* 208* 170*   Lipid Profile: No results for input(s): CHOL, HDL, LDLCALC, TRIG, CHOLHDL, LDLDIRECT in the last 72 hours. Thyroid Function Tests:  Recent Labs  03/27/17 1549  TSH 0.465   Anemia Panel: No results for input(s): VITAMINB12, FOLATE, FERRITIN, TIBC, IRON, RETICCTPCT in the last 72 hours. Sepsis Labs:  Recent Labs Lab 03/26/17 2339 03/27/17 0219  PROCALCITON 0.24  --   LATICACIDVEN 1.1 1.1    Recent Results (from the past 240 hour(s))  MRSA PCR Screening     Status: None   Collection Time: 03/26/17  9:58 PM  Result Value Ref Range Status   MRSA by PCR NEGATIVE NEGATIVE Final    Comment:        The GeneXpert MRSA Assay (FDA approved for  NASAL specimens only), is one component of a comprehensive MRSA colonization surveillance program. It is not intended to diagnose MRSA infection nor to guide or monitor treatment for MRSA infections.          Radiology Studies: Dg Chest Port 1 View  Result Date: 03/26/2017 CLINICAL DATA:  71 y/o  M; sepsis on BiPAP. EXAM: PORTABLE CHEST 1 VIEW COMPARISON:  08/23/2015 chest radiograph. FINDINGS: Low lung volumes accentuate pulmonary markings. Stable cardiac silhouette given projection and technique. Aortic atherosclerosis with calcification. Bibasilar streaky opacities probably represent atelectasis. Possible left lung base consolidation. No pleural effusion or pneumothorax. Cervical and thoracic spinal fusion hardware noted. No acute osseous abnormality is evident. IMPRESSION: Low lung volumes and bibasilar atelectasis. Left lung base consolidation is possible. Electronically Signed   By: Kristine Garbe M.D.   On: 03/26/2017 23:41        Scheduled Meds: .  aspirin EC  81 mg Oral Daily  . azithromycin  500 mg Intravenous Q24H  . baclofen  10 mg Oral TID  . cefTRIAXone (ROCEPHIN)  IV  1 g Intravenous Q24H  . chlorhexidine  15 mL Mouth Rinse BID  . enoxaparin (LOVENOX) injection  40 mg Subcutaneous Daily  . escitalopram  10 mg Oral Daily  . fesoterodine  8 mg Oral Daily  . furosemide  40 mg Intravenous BID  . gabapentin  400 mg Oral TID  . guaiFENesin  600 mg Oral BID  . hydrALAZINE  25 mg Oral Q8H  . insulin aspart  0-9 Units Subcutaneous TID WC  . levothyroxine  175 mcg Oral QAC breakfast  . losartan  50 mg Oral Daily  . mouth rinse  15 mL Mouth Rinse q12n4p  . pramipexole  0.5 mg Oral BID  . tamsulosin  0.4 mg Oral Daily   Continuous Infusions:   LOS: 2 days    Dron Tanna Furry, MD [facility] Pager [phone]  If 7PM-7AM, please contact night-coverage [email or web address] Password [email or web address] 03/28/2017, 11:13 AM

## 2017-03-28 NOTE — Progress Notes (Signed)
Progress Note  Patient Name: Travis [name] Date of Encounter: 03/28/2017  Primary Cardiologist: [place]  Subjective   Less dyspnea no chest pain   Inpatient Medications    Scheduled Meds: . aspirin EC  81 mg Oral Daily  . azithromycin  500 mg Intravenous Q24H  . baclofen  10 mg Oral TID  . cefTRIAXone (ROCEPHIN)  IV  1 g Intravenous Q24H  . chlorhexidine  15 mL Mouth Rinse BID  . enoxaparin (LOVENOX) injection  40 mg Subcutaneous Daily  . escitalopram  10 mg Oral Daily  . fesoterodine  8 mg Oral Daily  . furosemide  40 mg Intravenous BID  . gabapentin  400 mg Oral TID  . guaiFENesin  600 mg Oral BID  . hydrALAZINE  25 mg Oral Q8H  . insulin aspart  0-9 Units Subcutaneous TID WC  . levothyroxine  175 mcg Oral QAC breakfast  . losartan  50 mg Oral Daily  . mouth rinse  15 mL Mouth Rinse q12n4p  . pramipexole  0.5 mg Oral BID  . tamsulosin  0.4 mg Oral Daily   Continuous Infusions:  PRN Meds: acetaminophen **OR** acetaminophen, ipratropium-albuterol, LORazepam, ondansetron **OR** ondansetron (ZOFRAN) IV, polyethylene glycol, sodium chloride   Vital Signs    Vitals:   03/27/17 2314 03/27/17 2356 03/28/17 0352 03/28/17 0608  BP:  (!) 150/60 140/64 (!) 141/61  Pulse:  89 95   Resp:  (!) 25 (!) 25   Temp: 99.4 F (37.4 C) (!) 101.3 F (38.5 C) 98.2 F (36.8 C)   TempSrc: Oral Oral Oral   SpO2:  94% 91%   Weight:   282 lb (127.9 kg)   Height:        Intake/Output Summary (Last 24 hours) at 03/28/17 0859 Last data filed at 03/27/17 2051  Gross per 24 hour  Intake             1540 ml  Output             1375 ml  Net              165 ml   Filed Weights   03/27/17 0100 03/27/17 0500 03/28/17 0352  Weight: 229 lb 12.8 oz (104.2 kg) 232 lb 8 oz (105.5 kg) 282 lb (127.9 kg)    Telemetry    NSR 03/28/2017  - Personally Reviewed  ECG    NSR rate 65 ICRBBB otherwise normal  - Personally Reviewed  Physical Exam  Obese white male  GEN: No acute distress.     Neck: No JVD Cardiac: RRR, no murmurs, rubs, or gallops.  Respiratory:Basilar atelectasis no wheezing . RX:[id number] and distended not tender  MS: No edema; No deformity. Neuro:  Nonfocal  Psych: Normal affect  Plus 2 LE edema   Labs    Chemistry Recent Labs Lab 03/26/17 2339 03/28/17 0357  NA 140 141  K 4.3 4.0  CL 106 103  CO2 24 28  GLUCOSE 264* 211*  BUN 33* 45*  CREATININE 1.27* 1.50*  CALCIUM 9.3 9.2  PROT 7.4  --   ALBUMIN 3.8  --   AST 19  --   ALT 22  --   ALKPHOS 73  --   BILITOT 0.9  --   GFRNONAA 55* 45*  GFRAA >60 52*  ANIONGAP 10 10     Hematology Recent Labs Lab 03/26/17 2339 03/28/17 0357  WBC 14.3* 14.3*  RBC 4.39 4.24  HGB 13.1 12.9*  HCT 40.9 39.7  MCV 93.2 93.6  MCH 29.8 30.4  MCHC 32.0 32.5  RDW 14.7 15.4  PLT 146* 162    Cardiac Enzymes Recent Labs Lab 03/26/17 2339  TROPONINI 0.05*   No results for input(s): TROPIPOC in the last 168 hours.   BNP Recent Labs Lab 03/26/17 2339  BNP 265.1*     DDimer No results for input(s): DDIMER in the last 168 hours.   Radiology    Dg Chest Port 1 View  Result Date: 03/26/2017 CLINICAL DATA:  71 y/o  M; sepsis on BiPAP. EXAM: PORTABLE CHEST 1 VIEW COMPARISON:  08/23/2015 chest radiograph. FINDINGS: Low lung volumes accentuate pulmonary markings. Stable cardiac silhouette given projection and technique. Aortic atherosclerosis with calcification. Bibasilar streaky opacities probably represent atelectasis. Possible left lung base consolidation. No pleural effusion or pneumothorax. Cervical and thoracic spinal fusion hardware noted. No acute osseous abnormality is evident. IMPRESSION: Low lung volumes and bibasilar atelectasis. Left lung base consolidation is possible. Electronically Signed   By: Kristine Garbe M.D.   On: 03/26/2017 23:41    Cardiac Studies   Echo:  03/06/17 personally reviewed. EF 00-71% grade 2 diastolic dysfunction no valve disease   Patient Profile      71 y.o. male admitted with 30 lb weight gain diastolic dysfunction and possible LLL pneumonia  Assessment & Plan    1) Diastolic dysfunction: continue iv diuresis still volume overloaded will need to tolerate some azotemia to get dry 2) Pneumonia needs IS f/u CXR and antibiotics per primary service 3) CRF:  Continue diuresis until Cr closer to 1.8 then can change to PO demedex   Signed, Jenkins Rouge, MD  03/28/2017, 8:59 AM

## 2017-03-28 NOTE — Progress Notes (Signed)
Pt febrile this shift. Tylenol administered but temp increased. Pt cannot receive another dose at this time.  Paged [facility] for additional interventions. Order received for Ibuprofen x1. Will continue to monitor.  Jacqlyn Larsen, RN

## 2017-03-28 NOTE — Progress Notes (Signed)
Pharmacy Antibiotic Note  Travis [name] is a 71 y.o. male admitted on 03/26/2017 with pneumonia and UTI.  Pharmacy has been consulted for levofloxacin dosing. Pt was initially started on ceftriaxone and azithromycin for CAP but now has a urine culture from [facility] growing Pseudomonas sensitive to ciprofloxacin, so will transition to levofloxacin monotherapy.  Plan: -Discontinue ceftriaxone and azithromycin -Initiate levofloxacin 500mg  IV q24h -Follow-up cultures, renal function  Height: 6\' 1"  (185.4 cm) Weight: 282 lb (127.9 kg) IBW/kg (Calculated) : 79.9  Temp (24hrs), Avg:99.2 F (37.3 C), Min:97.5 F (36.4 C), Max:101.3 F (38.5 C)   Recent Labs Lab 03/26/17 2339 03/27/17 0219 03/28/17 0357  WBC 14.3*  --  14.3*  CREATININE 1.27*  --  1.50*  LATICACIDVEN 1.1 1.1  --     Estimated Creatinine Clearance: 62.4 mL/min (A) (by C-G formula based on SCr of 1.5 mg/dL (H)).    Allergies  Allergen Reactions  . Other Other (See Comments)    Anesthesia makes sick, must have antinausea medication in advance.    Antimicrobials this admission: 3/30 Azithro >> 3/31 3/30 CTX >> 3/31 3/31 Levofloxacin >>  Dose adjustments this admission: n/a  Microbiology results: 3/29 BCx: In process  3/29 UCx ([place]): Pseudomonas (sensitive to Cipro) Influenza panel negative at outside hospital   Thank you for allowing pharmacy to be a part of this patient's care.  Arrie Senate, PharmD PGY-1 Pharmacy Resident Pager: [phone] 03/28/2017

## 2017-03-28 NOTE — Progress Notes (Signed)
Urine culture result from [facility] reviewed.  UC growing: pseudomonas sensitive to cipro  Will change antibiotics to levaquin which will cover both UTI and CAP.

## 2017-03-29 ENCOUNTER — Inpatient Hospital Stay (HOSPITAL_COMMUNITY): Payer: Medicare Other

## 2017-03-29 DIAGNOSIS — N390 Urinary tract infection, site not specified: Secondary | ICD-10-CM

## 2017-03-29 LAB — BASIC METABOLIC PANEL
Anion gap: 7 (ref 5–15)
BUN: 51 mg/dL — AB (ref 6–20)
CHLORIDE: 100 mmol/L — AB (ref 101–111)
CO2: 31 mmol/L (ref 22–32)
Calcium: 8.6 mg/dL — ABNORMAL LOW (ref 8.9–10.3)
Creatinine, Ser: 1.66 mg/dL — ABNORMAL HIGH (ref 0.61–1.24)
GFR calc Af Amer: 46 mL/min — ABNORMAL LOW (ref >60)
GFR calc non Af Amer: 40 mL/min — ABNORMAL LOW (ref >60)
GLUCOSE: 177 mg/dL — AB (ref 65–99)
POTASSIUM: 3.9 mmol/L (ref 3.5–5.1)
SODIUM: 138 mmol/L (ref 135–145)

## 2017-03-29 LAB — CBC
HEMATOCRIT: 35.6 % — AB (ref 39.0–52.0)
HEMOGLOBIN: 11.2 g/dL — AB (ref 13.0–17.0)
MCH: 30.1 pg (ref 26.0–34.0)
MCHC: 31.5 g/dL (ref 30.0–36.0)
MCV: 95.7 fL (ref 78.0–100.0)
Platelets: 158 10*3/uL (ref 150–400)
RBC: 3.72 MIL/uL — ABNORMAL LOW (ref 4.22–5.81)
RDW: 15.3 % (ref 11.5–15.5)
WBC: 8.6 10*3/uL (ref 4.0–10.5)

## 2017-03-29 LAB — GLUCOSE, CAPILLARY
Glucose-Capillary: 185 mg/dL — ABNORMAL HIGH (ref 65–99)
Glucose-Capillary: 245 mg/dL — ABNORMAL HIGH (ref 65–99)
Glucose-Capillary: 174 mg/dL — ABNORMAL HIGH (ref 65–99)
Glucose-Capillary: 203 mg/dL — ABNORMAL HIGH (ref 65–99)

## 2017-03-29 MED ORDER — LEVOFLOXACIN 500 MG PO TABS
500.0000 mg | ORAL_TABLET | Freq: Every day | ORAL | Status: DC
  Administered 2017-03-29 – 2017-03-31 (×3): 500 mg via ORAL
  Filled 2017-03-29 (×3): qty 1

## 2017-03-29 MED ORDER — SODIUM CHLORIDE 0.9% FLUSH
3.0000 mL | Freq: Two times a day (BID) | INTRAVENOUS | Status: DC
  Administered 2017-03-29 – 2017-04-01 (×7): 3 mL via INTRAVENOUS

## 2017-03-29 MED ORDER — SODIUM CHLORIDE 0.9% FLUSH: 3.0000 mL | INTRAVENOUS | Status: DC | PRN

## 2017-03-29 NOTE — Progress Notes (Signed)
Progress Note  Patient Name: Travis [name] Date of Encounter: 03/29/2017  Primary Cardiologist: [place]  Subjective   Less dyspnea no chest pain has been OOB   Inpatient Medications    Scheduled Meds: . aspirin EC  81 mg Oral Daily  . baclofen  10 mg Oral TID  . chlorhexidine  15 mL Mouth Rinse BID  . enoxaparin (LOVENOX) injection  40 mg Subcutaneous Daily  . escitalopram  10 mg Oral Daily  . fesoterodine  8 mg Oral Daily  . furosemide  40 mg Intravenous BID  . gabapentin  400 mg Oral TID  . guaiFENesin  600 mg Oral BID  . hydrALAZINE  25 mg Oral Q8H  . insulin aspart  0-9 Units Subcutaneous TID WC  . levofloxacin (LEVAQUIN) IV  500 mg Intravenous Q24H  . levothyroxine  175 mcg Oral QAC breakfast  . losartan  50 mg Oral Daily  . mouth rinse  15 mL Mouth Rinse q12n4p  . pramipexole  0.5 mg Oral BID  . sodium chloride flush  3 mL Intravenous Q12H  . tamsulosin  0.4 mg Oral Daily   Continuous Infusions:  PRN Meds: acetaminophen **OR** acetaminophen, ipratropium-albuterol, LORazepam, ondansetron **OR** ondansetron (ZOFRAN) IV, polyethylene glycol, sodium chloride, sodium chloride flush   Vital Signs    Vitals:   03/28/17 2052 03/28/17 2213 03/29/17 0500 03/29/17 0700  BP: 130/61  131/65   Pulse: 78 87 87   Resp: (!) 22 19 (!) 23   Temp: 98.7 F (37.1 C)  99.6 F (37.6 C)   TempSrc: Oral  Oral   SpO2: 91% 92% 96%   Weight:    282 lb 8 oz (128.1 kg)  Height:        Intake/Output Summary (Last 24 hours) at 03/29/17 0944 Last data filed at 03/29/17 0830  Gross per 24 hour  Intake             1140 ml  Output             1870 ml  Net             -730 ml   Filed Weights   03/27/17 0500 03/28/17 0352 03/29/17 0700  Weight: 232 lb 8 oz (105.5 kg) 282 lb (127.9 kg) 282 lb 8 oz (128.1 kg)    Telemetry    NSR 03/29/2017  - Personally Reviewed  ECG    NSR rate 65 ICRBBB otherwise normal  - Personally Reviewed  Physical Exam  Obese white male  GEN: No acute  distress.   Neck: No JVD Cardiac: RRR, no murmurs, rubs, or gallops.  Respiratory:Basilar atelectasis no wheezing . EG:[id number] and distended not tender  MS: No edema; No deformity. Neuro:  Nonfocal  Psych: Normal affect  Plus 2 LE edema   Labs    Chemistry  Recent Labs Lab 03/26/17 2339 03/28/17 0357 03/29/17 0315  NA 140 141 138  K 4.3 4.0 3.9  CL 106 103 100*  CO2 24 28 31   GLUCOSE 264* 211* 177*  BUN 33* 45* 51*  CREATININE 1.27* 1.50* 1.66*  CALCIUM 9.3 9.2 8.6*  PROT 7.4  --   --   ALBUMIN 3.8  --   --   AST 19  --   --   ALT 22  --   --   ALKPHOS 73  --   --   BILITOT 0.9  --   --   GFRNONAA 55* 45* 40*  GFRAA >60 52* 46*  ANIONGAP 10 10 7      Hematology  Recent Labs Lab 03/26/17 2339 03/28/17 0357 03/29/17 0315  WBC 14.3* 14.3* 8.6  RBC 4.39 4.24 3.72*  HGB 13.1 12.9* 11.2*  HCT 40.9 39.7 35.6*  MCV 93.2 93.6 95.7  MCH 29.8 30.4 30.1  MCHC 32.0 32.5 31.5  RDW 14.7 15.4 15.3  PLT 146* 162 158    Cardiac Enzymes  Recent Labs Lab 03/26/17 2339  TROPONINI 0.05*   No results for input(s): TROPIPOC in the last 168 hours.   BNP  Recent Labs Lab 03/26/17 2339  BNP 265.1*     DDimer No results for input(s): DDIMER in the last 168 hours.   Radiology    Dg Chest 2 View  Result Date: 03/29/2017 CLINICAL DATA:  Shortness of breath, weakness, recent hx pna. Hx diabetes EXAM: CHEST  2 VIEW COMPARISON:  03/26/2017 FINDINGS: Lung volumes are low, similar to the previous exam. There is persistent lung base opacity consistent with atelectasis. Remainder of the lungs is clear. No apparent pleural effusion. No evidence of a pneumothorax. IMPRESSION: 1. No acute findings. 2. Low lung volumes and persistent lung base atelectasis. No significant change from the most recent prior study. Electronically Signed   By: Lajean Manes M.D.   On: 03/29/2017 07:54    Cardiac Studies   Echo:  03/06/17 personally reviewed. EF 02-33% grade 2 diastolic dysfunction  no valve disease   Patient Profile     71 y.o. male admitted with 30 lb weight gain diastolic dysfunction and possible LLL pneumonia  Assessment & Plan    1) Diastolic dysfunction: continue iv diuresis still volume overloaded will need to tolerate some azotemia to get dry  2) Pneumonia needs IS f/u CXR and antibiotics per primary service 3) CRF:  Continue diuresis until Cr closer to 1.8 then can change to PO demedex CR 1.6 today 4. UTI pseudomonas now on cipro foley still in place   Signed, Jenkins Rouge, MD  03/29/2017, 9:44 AM

## 2017-03-29 NOTE — Progress Notes (Signed)
PROGRESS NOTE    Travis [name]  QQP:[id number] DOB: November 03, 1946 DOA: 03/26/2017 PCP: Simona Huh, MD   Brief Narrative: 71 y.o. male with medical history significant of HTN, chronic diastolic CHF and EF 71-24% in 02/2017, CKD stage III, DM type 2, who presented to [facility] with shortness of breath and cough and fever. In the ER patient was found to havefever up to 102.83F, pulse 120, respirations 30, O2 saturations 89%, blood pressure 206/95. Patient was required placement to BiPAP with some improvement in respiratory distress. Chest x-ray showed low lung volumes with minimal bibasilar atelectasis. Influenza screen negative. Patient was started empirically on antibiotics of ceftriaxone and azithromycin. Family requested transfer to [facility] as patient's doctors are here.Followed by Dr. Cathie Olden of cardiology.  Assessment & Plan:   # Acute on chronic diastolic congestive heart failure: -Clinically improving, off BiPAP and on 2 liters. Continue IV Lasix,  Losartan, may be able to switch to oral diuretics tomorrow. Discussed with Dr. Johnsie Cancel today. -Monitor BMP, strict ins and outs, daily weight -Cardiology consult appreciated. -Echocardiogram from 03/06/2017 with EF of 65-70% and grade 2 diastolic dysfunction. BNP 265 on admission.  #Possible community acquired pneumonia: dc ceftriaxone and azithromycin and switched to levaquin as urine culture growing pseudomas. Respiratory symptoms improving. -Influenza negative at outside hospital. -Patient was afebrile last night. Cultures negative so far. Cough improved.  #Acute hypoxic respiratory failure: Off BiPAP and on 2 L of oxygen. Continue bronchodilators and breathing treatment. Try to wean down oxygen to room air as tolerated.  # pseudomonas UTI, site unspecified: pt has condom catheter. On levaquin since 3/31. Afebrile.  #Hypertensive: Monitor blood pressure. Continue Lasix, hydralazine,  losartan.  #Chronic kidney disease stage  III: Serum creatinine mildly elevated to 1.6 but is still at baseline. Continue IV lasix till creatinine level 1.8 as per Cardiology. Continue to monitor BMP.  # type 2 DM: monitor blood sugar level. On insulin sliding scale.   #Nonobstructive coronary artery disease: No angina. Continue aspirin.  PT/OT evaluation done. Recommended rehab. Pt declined rehab and asking to go home possibly tomorrow.   Principal Problem:   Acute respiratory failure with hypoxia (HCC) Active Problems:   CAP (community acquired pneumonia)    Type II diabetes mellitus ([place])  DVT prophylaxis: Lovenox subcutaneous Code Status: Full code Family Communication: No family present at bedside. Disposition Plan: Likely discharge home versus rehabilitation in 1-2 days.     Consultants:   Cardiology  Procedures: None Antimicrobials: Ceftriaxone and azithromycin since 3/30.3/31 levaquin since 3/31  Subjective: Patient was seen and examined at bedside. Shortness of breath improving. Denied chest pain, nausea vomiting. No headache or dizziness. Patient was out of bed.  Objective: Vitals:   03/28/17 2052 03/28/17 2213 03/29/17 0500 03/29/17 0700  BP: 130/61  131/65   Pulse: 78 87 87   Resp: (!) 22 19 (!) 23   Temp: 98.7 F (37.1 C)  99.6 F (37.6 C)   TempSrc: Oral  Oral   SpO2: 91% 92% 96%   Weight:    128.1 kg (282 lb 8 oz)  Height:        Intake/Output Summary (Last 24 hours) at 03/29/17 1149 Last data filed at 03/29/17 1054  Gross per 24 hour  Intake             1140 ml  Output             2070 ml  Net             -  930 ml   Filed Weights   03/27/17 0500 03/28/17 0352 03/29/17 0700  Weight: 105.5 kg (232 lb 8 oz) 127.9 kg (282 lb) 128.1 kg (282 lb 8 oz)    Examination:  General exam: Not in distress Respiratory system: Clear bilateral. Respiratory effort normal. No wheezing  Cardiovascular system: Regular rate and rhythm, S1 and S2 normal Gastrointestinal system: Abdomen is soft,  nontender. Bowel sound positive Central nervous system: Alert and oriented. No focal neurological deficits. Extremities: Symmetric 5 x 5 power. Lower extremity pitting edema improving. Skin: No rashes, lesions or ulcers Psychiatry: Judgement and insight appear normal. Mood & affect appropriate.     Data Reviewed: I have personally reviewed following labs and imaging studies  CBC:  Recent Labs Lab 03/26/17 2339 03/28/17 0357 03/29/17 0315  WBC 14.3* 14.3* 8.6  NEUTROABS 13.6*  --   --   HGB 13.1 12.9* 11.2*  HCT 40.9 39.7 35.6*  MCV 93.2 93.6 95.7  PLT 146* 162 035   Basic Metabolic Panel:  Recent Labs Lab 03/26/17 2339 03/28/17 0357 03/29/17 0315  NA 140 141 138  K 4.3 4.0 3.9  CL 106 103 100*  CO2 24 28 31   GLUCOSE 264* 211* 177*  BUN 33* 45* 51*  CREATININE 1.27* 1.50* 1.66*  CALCIUM 9.3 9.2 8.6*  MG  --  2.3  --    GFR: Estimated Creatinine Clearance: 56.4 mL/min (A) (by C-G formula based on SCr of 1.66 mg/dL (H)). Liver Function Tests:  Recent Labs Lab 03/26/17 2339  AST 19  ALT 22  ALKPHOS 73  BILITOT 0.9  PROT 7.4  ALBUMIN 3.8   No results for input(s): LIPASE, AMYLASE in the last 168 hours. No results for input(s): AMMONIA in the last 168 hours. Coagulation Profile:  Recent Labs Lab 03/26/17 2339  INR 1.09   Cardiac Enzymes:  Recent Labs Lab 03/26/17 2339  TROPONINI 0.05*   BNP (last 3 results)  Recent Labs  02/12/17 1522  PROBNP 109   HbA1C: No results for input(s): HGBA1C in the last 72 hours. CBG:  Recent Labs Lab 03/28/17 1122 03/28/17 1609 03/28/17 2155 03/29/17 0747 03/29/17 1124  GLUCAP 213* 187* 204* 185* 245*   Lipid Profile: No results for input(s): CHOL, HDL, LDLCALC, TRIG, CHOLHDL, LDLDIRECT in the last 72 hours. Thyroid Function Tests:  Recent Labs  03/27/17 1549  TSH 0.465   Anemia Panel: No results for input(s): VITAMINB12, FOLATE, FERRITIN, TIBC, IRON, RETICCTPCT in the last 72 hours. Sepsis  Labs:  Recent Labs Lab 03/26/17 2339 03/27/17 0219  PROCALCITON 0.24  --   LATICACIDVEN 1.1 1.1    Recent Results (from the past 240 hour(s))  MRSA PCR Screening     Status: None   Collection Time: 03/26/17  9:58 PM  Result Value Ref Range Status   MRSA by PCR NEGATIVE NEGATIVE Final    Comment:        The GeneXpert MRSA Assay (FDA approved for NASAL specimens only), is one component of a comprehensive MRSA colonization surveillance program. It is not intended to diagnose MRSA infection nor to guide or monitor treatment for MRSA infections.   Culture, blood (x 2)     Status: None (Preliminary result)   Collection Time: 03/26/17 11:50 PM  Result Value Ref Range Status   Specimen Description BLOOD RIGHT ANTECUBITAL  Final   Special Requests BOTTLES DRAWN AEROBIC AND ANAEROBIC 10CC EACH  Final   Culture NO GROWTH 2 DAYS  Final   Report Status  PENDING  Incomplete  Culture, blood (x 2)     Status: None (Preliminary result)   Collection Time: 03/26/17 11:59 PM  Result Value Ref Range Status   Specimen Description BLOOD LEFT ANTECUBITAL  Final   Special Requests BOTTLES DRAWN AEROBIC AND ANAEROBIC 5CC EACH  Final   Culture NO GROWTH 2 DAYS  Final   Report Status PENDING  Incomplete         Radiology Studies: Dg Chest 2 View  Result Date: 03/29/2017 CLINICAL DATA:  Shortness of breath, weakness, recent hx pna. Hx diabetes EXAM: CHEST  2 VIEW COMPARISON:  03/26/2017 FINDINGS: Lung volumes are low, similar to the previous exam. There is persistent lung base opacity consistent with atelectasis. Remainder of the lungs is clear. No apparent pleural effusion. No evidence of a pneumothorax. IMPRESSION: 1. No acute findings. 2. Low lung volumes and persistent lung base atelectasis. No significant change from the most recent prior study. Electronically Signed   By: Lajean Manes M.D.   On: 03/29/2017 07:54        Scheduled Meds: . aspirin EC  81 mg Oral Daily  . baclofen  10  mg Oral TID  . chlorhexidine  15 mL Mouth Rinse BID  . enoxaparin (LOVENOX) injection  40 mg Subcutaneous Daily  . escitalopram  10 mg Oral Daily  . fesoterodine  8 mg Oral Daily  . furosemide  40 mg Intravenous BID  . gabapentin  400 mg Oral TID  . guaiFENesin  600 mg Oral BID  . hydrALAZINE  25 mg Oral Q8H  . insulin aspart  0-9 Units Subcutaneous TID WC  . levofloxacin (LEVAQUIN) IV  500 mg Intravenous Q24H  . levothyroxine  175 mcg Oral QAC breakfast  . losartan  50 mg Oral Daily  . mouth rinse  15 mL Mouth Rinse q12n4p  . pramipexole  0.5 mg Oral BID  . sodium chloride flush  3 mL Intravenous Q12H  . tamsulosin  0.4 mg Oral Daily   Continuous Infusions:   LOS: 3 days    Dron Tanna Furry, MD [facility] Pager [phone]  If 7PM-7AM, please contact night-coverage [email or web address] Password [facility] 03/29/2017, 11:49 AM

## 2017-03-29 NOTE — Progress Notes (Signed)
Patient developed local reaction as infusion of levofloxacin was initiated.  He stated that was experiencing severe itching at and proximal to the site and area was exhibiting some redness.  Infusion was discontinued and pharmacist and MD were notified.

## 2017-03-29 NOTE — Evaluation (Signed)
Occupational Therapy Evaluation Patient Details Name: Travis [name] MRN: [id number] DOB: 03/26/1946 Today's Date: 03/29/2017    History of Present Illness Pt is a 71 y.o. male admitted to ED on 03/26/17 with worsening SOB, fever, chills and vomiting; workup revealed acute resp failure with CAP. CXR shows low lung volumes and bibasilar atelectasis. Pt being seen at OP neuro PT from 3/9-3/23 for gait mechanics and balance. Pertinent PMH includes CHF, chronic renal insufficiency, HTN, OSA, poorly controlled DM, cervical myelopathy.    Clinical Impression   Pt admitted with above. He demonstrates the below listed deficits and will benefit from continued OT to maximize safety and independence with BADLs.  Pt presents with generalized weakness, impaired balance, decreased activity tolerance.  He requires mod - max A for LB ADLs.  He requires mod a to move sit to stand and min guard for functional transfers.  Wife reports she is unable to provide physical assist at discharge (can help with standing minimally and with LB ADLs).   He may need SNF level rehab at discharge.    Will follow.      Follow Up Recommendations  SNF;Supervision/Assistance - 24 hour    Equipment Recommendations  Tub/shower seat    Recommendations for Other Services       Precautions / Restrictions Precautions Precautions: Fall Restrictions Weight Bearing Restrictions: No      Mobility Bed Mobility               General bed mobility comments: Pt sitting in chair   Transfers Overall transfer level: Needs assistance Equipment used: Rolling walker (2 wheeled) Transfers: Sit to/from [employer] Transfers Sit to Stand: Mod assist Stand pivot transfers: Min guard       General transfer comment: Pt requires mod A and increased time to move sit to stand from lower chair     Balance Overall balance assessment: Needs assistance Sitting-balance support: Feet supported Sitting balance-Leahy Scale:  Fair Sitting balance - Comments: posterior lean  Postural control: Posterior lean Standing balance support: Bilateral upper extremity supported Standing balance-Leahy Scale: Poor Standing balance comment: Reliant on BUE support for balance                           ADL either performed or assessed with clinical judgement   ADL Overall ADL's : Needs assistance/impaired Eating/Feeding: Independent   Grooming: Wash/dry hands;Wash/dry face;Oral care;Brushing hair;Min guard;Standing   Upper Body Bathing: Supervision/ safety;Set up;Sitting   Lower Body Bathing: Moderate assistance;Sit to/from stand   Upper Body Dressing : Supervision/safety;Sitting   Lower Body Dressing: Maximal assistance;Sit to/from stand   Toilet Transfer: Ambulation;Comfort height toilet;Grab bars;RW;Moderate assistance   Toileting- Clothing Manipulation and Hygiene: Minimal assistance;Sit to/from stand       Functional mobility during ADLs: Min guard;Minimal assistance;Rolling walker General ADL Comments: Pt unable to access feet.  He requires mod A for sit to stand from lower surface      Vision         Perception     Praxis      Pertinent Vitals/Pain Pain Assessment: No/denies pain     Hand Dominance Right   Extremity/Trunk Assessment Upper Extremity Assessment Upper Extremity Assessment: Generalized weakness   Lower Extremity Assessment Lower Extremity Assessment: Defer to PT evaluation   Cervical / Trunk Assessment Cervical / Trunk Assessment: Kyphotic   Communication Communication Communication: No difficulties   Cognition Arousal/Alertness: Awake/alert Behavior During Therapy: WFL for tasks assessed/performed Overall Cognitive  Status: Within Functional Limits for tasks assessed                                     General Comments  VSS - pt on 2L 02.  wife reports pt need to be min guard to light min A for her to mange him at home with exception of socks  and shoes     Exercises     Shoulder Instructions      Home Living Family/patient expects to be discharged to:: Private residence Living Arrangements: Spouse/significant other Available Help at Discharge: Family;Available 24 hours/day Type of Home: House Home Access: Ramped entrance     Home Layout: One level     Bathroom Shower/Tub: Occupational psychologist: Handicapped height Bathroom Accessibility: Yes How Accessible: Accessible via walker Home Equipment: [place] - 2 wheels;Walker - 4 wheels;Cane - single point;Bedside commode;Wheelchair - manual          Prior Functioning/Environment Level of Independence: Needs assistance  Gait / Transfers Assistance Needed: Amb with RW. Requires assist from wife to stand from chair. Reports OP PT has ordered him bilat AFOs to start wearing. Uses CPAP at night.  ADL's / Homemaking Assistance Needed: wife has to assist with donning/doffing socks and shoes             OT Problem List: Decreased strength;Decreased activity tolerance;Impaired balance (sitting and/or standing);Decreased safety awareness;Cardiopulmonary status limiting activity;Obesity      OT Treatment/Interventions: Self-care/ADL training;DME and/or AE instruction;Therapeutic activities;Balance training;Patient/family education;Therapeutic exercise    OT Goals(Current goals can be found in the care plan section) Acute Rehab OT Goals Patient Stated Goal: Return home OT Goal Formulation: With patient/family Time For Goal Achievement: 04/12/17 Potential to Achieve Goals: Good ADL Goals Pt Will Perform Grooming: with supervision;standing Pt Will Perform Lower Body Bathing: with supervision;sit to/from stand;with adaptive equipment Pt Will Perform Lower Body Dressing: with supervision;with adaptive equipment;sit to/from stand Pt Will Transfer to Toilet: with supervision;ambulating;bedside commode;grab bars;regular height toilet Pt Will Perform Toileting -  Clothing Manipulation and hygiene: with supervision;sit to/from stand;with adaptive equipment  OT Frequency: Min 2X/week   Barriers to D/C: Decreased caregiver support          Co-evaluation              End of Session Equipment Utilized During Treatment: Rolling walker;Oxygen;Gait belt Nurse Communication: Mobility status  Activity Tolerance: Patient tolerated treatment well Patient left: in chair;with call bell/phone within reach;with family/visitor present  OT Visit Diagnosis: Unsteadiness on feet (R26.81);Muscle weakness (generalized) (M62.81)                Time: 3612-2449 OT Time Calculation (min): 44 min Charges:  OT General Charges $OT Visit: 1 Procedure OT Evaluation $OT Eval Moderate Complexity: 1 Procedure OT Treatments $Therapeutic Activity: 8-22 mins G-Codes:     [facility], OTR/L [phone]   Lucille Passy M 03/29/2017, 4:25 PM

## 2017-03-29 NOTE — Progress Notes (Signed)
RT NOTE:  Pt has home CPAP setup @ bedside. RT assistance not needed per pt. Pt understands to call RT if needed.

## 2017-03-30 DIAGNOSIS — N390 Urinary tract infection, site not specified: Secondary | ICD-10-CM

## 2017-03-30 DIAGNOSIS — R2689 Other abnormalities of gait and mobility: Secondary | ICD-10-CM | POA: Diagnosis not present

## 2017-03-30 LAB — CBC
HCT: 37.4 % — ABNORMAL LOW (ref 39.0–52.0)
Hemoglobin: 11.8 g/dL — ABNORMAL LOW (ref 13.0–17.0)
MCH: 29.6 pg (ref 26.0–34.0)
MCHC: 31.6 g/dL (ref 30.0–36.0)
MCV: 93.7 fL (ref 78.0–100.0)
Platelets: 161 10*3/uL (ref 150–400)
RBC: 3.99 MIL/uL — AB (ref 4.22–5.81)
RDW: 15 % (ref 11.5–15.5)
WBC: 6.6 10*3/uL (ref 4.0–10.5)

## 2017-03-30 LAB — GLUCOSE, CAPILLARY
GLUCOSE-CAPILLARY: 172 mg/dL — AB (ref 65–99)
Glucose-Capillary: 199 mg/dL — ABNORMAL HIGH (ref 65–99)
Glucose-Capillary: 233 mg/dL — ABNORMAL HIGH (ref 65–99)
Glucose-Capillary: 228 mg/dL — ABNORMAL HIGH (ref 65–99)

## 2017-03-30 LAB — BASIC METABOLIC PANEL
Anion gap: 8 (ref 5–15)
BUN: 47 mg/dL — AB (ref 6–20)
CHLORIDE: 102 mmol/L (ref 101–111)
CO2: 30 mmol/L (ref 22–32)
Calcium: 8.8 mg/dL — ABNORMAL LOW (ref 8.9–10.3)
Creatinine, Ser: 1.54 mg/dL — ABNORMAL HIGH (ref 0.61–1.24)
GFR calc Af Amer: 50 mL/min — ABNORMAL LOW (ref >60)
GFR calc non Af Amer: 43 mL/min — ABNORMAL LOW (ref >60)
GLUCOSE: 181 mg/dL — AB (ref 65–99)
Potassium: 3.7 mmol/L (ref 3.5–5.1)
SODIUM: 140 mmol/L (ref 135–145)

## 2017-03-30 MED ORDER — FUROSEMIDE 10 MG/ML IJ SOLN
80.0000 mg | Freq: Two times a day (BID) | INTRAMUSCULAR | Status: DC
  Administered 2017-03-30 – 2017-04-01 (×4): 80 mg via INTRAVENOUS
  Filled 2017-03-30 (×4): qty 8

## 2017-03-30 NOTE — Progress Notes (Signed)
qPhysical Therapy Treatment Patient Details Name: Travis [name] MRN: [id number] DOB: 1946/11/25 Today's Date: 03/30/2017    History of Present Illness Pt is a 71 y.o. male admitted to ED on 03/26/17 with worsening SOB, fever, chills and vomiting; workup revealed acute resp failure with CAP. CXR shows low lung volumes and bibasilar atelectasis. Pt being seen at OP neuro PT from 3/9-3/23 for gait mechanics and balance. Pertinent PMH includes CHF, chronic renal insufficiency, HTN, OSA, poorly controlled DM, cervical myelopathy.     PT Comments    Pt ambulated 240' with RW and min-guard A. O2 sats 88% on RA, 94% on 2L and pt reports he feels a little dizzy off O2, less so when on O2. Feet shuffling and pt fatigued by end of walk, discussed safety concerns. Pt desiring to go home with HHPT and resume OPPT when able. PT will continue to follow.   Follow Up Recommendations  Home health PT;Supervision/Assistance - 24 hour     Equipment Recommendations  None recommended by PT    Recommendations for Other Services OT consult     Precautions / Restrictions Precautions Precautions: Fall Restrictions Weight Bearing Restrictions: No    Mobility  Bed Mobility               General bed mobility comments: Pt sitting in chair   Transfers Overall transfer level: Needs assistance Equipment used: Rolling walker (2 wheeled) Transfers: Sit to/from Stand Sit to Stand: Mod assist         General transfer comment: mod A for power up and stabilization as pt slides feet back under him.   Ambulation/Gait Ambulation/Gait assistance: Min guard Ambulation Distance (Feet): 240 Feet Assistive device: Rolling walker (2 wheeled) Gait Pattern/deviations: Wide base of support;Decreased stride length;Trunk flexed;Step-through pattern Gait velocity: Decreased Gait velocity interpretation: <1.8 ft/sec, indicative of risk for recurrent falls General Gait Details: pt with wide stance and therefore  keeps trunk slightly flexed and RW in front of him, LLE sometimes outside of RW. With h/o back surgeries, he does not have good LE control when he stands all the way upright but discussed safety issues with his current posture. Will hopefully improve with AFO's. O2 sats 88% on RA, 94% on 2L.    Stairs            Wheelchair Mobility    Modified Rankin (Stroke Patients Only)       Balance Overall balance assessment: Needs assistance Sitting-balance support: Feet supported Sitting balance-Leahy Scale: Fair     Standing balance support: Bilateral upper extremity supported Standing balance-Leahy Scale: Poor Standing balance comment: heavy lean onto RW                            Cognition Arousal/Alertness: Awake/alert Behavior During Therapy: WFL for tasks assessed/performed Overall Cognitive Status: Within Functional Limits for tasks assessed                                        Exercises      General Comments        Pertinent Vitals/Pain Pain Assessment: No/denies pain    Home Living                      Prior Function            PT Goals (current goals can  now be found in the care plan section) Acute Rehab PT Goals Patient Stated Goal: Return home PT Goal Formulation: With patient Time For Goal Achievement: 04/10/17 Potential to Achieve Goals: Fair Progress towards PT goals: Progressing toward goals    Frequency    Min 3X/week      PT Plan Discharge plan needs to be updated    Co-evaluation             End of Session Equipment Utilized During Treatment: Gait belt;Oxygen Activity Tolerance: Patient tolerated treatment well Patient left: in chair;with call bell/phone within reach Nurse Communication: Mobility status PT Visit Diagnosis: Muscle weakness (generalized) (M62.81);Other abnormalities of gait and mobility (R26.89)     Time: 0321-2248 PT Time Calculation (min) (ACUTE ONLY): 34  min  Charges:  $Gait Training: 23-37 mins                    G Codes:       Travis  [name] 03/30/2017, 11:37 AM

## 2017-03-30 NOTE — Progress Notes (Signed)
RT came to place patient on CPAP HS. Patient is already on HOME Unit. Patient tolerating well.

## 2017-03-30 NOTE — Progress Notes (Addendum)
PROGRESS NOTE    Travis [name]  DGU:[id number] DOB: 1946/05/08 DOA: 03/26/2017 PCP: Simona Huh, MD   Brief Narrative: 71 y.o. male with medical history significant of HTN, chronic diastolic CHF and EF 95-63% in 02/2017, CKD stage III, DM type 2, who presented to [facility] with shortness of breath and cough and fever. In the ER patient was found to havefever up to 102.11F, pulse 120, respirations 30, O2 saturations 89%, blood pressure 206/95. Patient was required placement to BiPAP with some improvement in respiratory distress. Chest x-ray showed low lung volumes with minimal bibasilar atelectasis. Influenza screen negative. Patient was started empirically on antibiotics of ceftriaxone and azithromycin. Family requested transfer to [facility] as patient's doctors are here.Followed by Dr. Cathie Olden of cardiology.  Assessment & Plan:   # Acute on chronic diastolic congestive heart failure: -Clinically improving,currently on 2 liters. Continue IV Lasix,  Losartan. Reported 2 lb weight gain since yesterday likely inaccurate measurement. Diuretics managed by cardiology team. -Monitor BMP, strict ins and outs, daily weight -Cardiology consult appreciated. -Echocardiogram from 03/06/2017 with EF of 65-70% and grade 2 diastolic dysfunction. BNP 265 on admission.  #Possible community acquired pneumonia:  Respiratory symptoms improving. He had infusion site reaction with IV  levaquin, switched to oral levaquin and has been tolerating well. -Influenza negative at outside hospital. -afebrile. Cultures negative so far. Cough improved.  #Acute hypoxic respiratory failure: Off BiPAP and on 2 L of oxygen. Continue bronchodilators and breathing treatment. Try to wean down oxygen to room air as tolerated. Check ambulatory oxygen saturation.  # pseudomonas UTI, site unspecified: pt has condom catheter. On levaquin since 3/31. Afebrile.  #Hypertensive: Monitor blood pressure. Continue Lasix,  hydralazine,  losartan.  #Chronic kidney disease stage III: Serum creatinine stable at 1.5.on IV lasix. Continue to monitor BMP.  # type 2 DM: monitor blood sugar level. On insulin sliding scale.   #Nonobstructive coronary artery disease: No angina. Continue aspirin.  PT/OT evaluation done. Recommended rehab. Pt declined rehab and asking to go home on discharge.   Principal Problem:   Acute respiratory failure with hypoxia (HCC) Active Problems:   CAP (community acquired pneumonia)    Type II diabetes mellitus ([place])  DVT prophylaxis: Lovenox subcutaneous Code Status: Full code Family Communication: No family present at bedside. Disposition Plan: Likely discharge home with home care versus rehabilitation in 1-2 days.     Consultants:   Cardiology  Procedures: None Antimicrobials: Ceftriaxone and azithromycin since 3/30.3/31 levaquin since 3/31  Subjective: Patient was seen and examined at bedside. Denied shortness of breath, chest pain, nausea or vomiting. Anxious about weight gain. Reported that he does not want to go to skilled facility or rehabilitation on discharge. Objective: Vitals:   03/29/17 2137 03/30/17 0200 03/30/17 0536 03/30/17 0543  BP: (!) 146/54   (!) 138/57  Pulse:    78  Resp:      Temp: 99.9 F (37.7 C) 97.9 F (36.6 C)  98 F (36.7 C)  TempSrc: Oral Oral  Oral  SpO2: 97%   90%  Weight:   129 kg (284 lb 8 oz)   Height:        Intake/Output Summary (Last 24 hours) at 03/30/17 1043 Last data filed at 03/30/17 0800  Gross per 24 hour  Intake              960 ml  Output             1375 ml  Net             -  415 ml   Filed Weights   03/28/17 0352 03/29/17 0700 03/30/17 0536  Weight: 127.9 kg (282 lb) 128.1 kg (282 lb 8 oz) 129 kg (284 lb 8 oz)    Examination:  General exam: Sitting on chair, not in distress Respiratory system: Clear bilateral. Respiratory effort normal. No wheezing  Cardiovascular system: Regular rate and rhythm, S1-S2  normal Gastrointestinal system: Abdomen is soft, nontender. Bowel sound positive Central nervous system: Alert and oriented. No focal neurological deficits. Extremities: Symmetric 5 x 5 power. Lower extremity pitting edema gradually improving. Skin: No rashes, lesions or ulcers Psychiatry: Judgement and insight appear normal. Mood & affect appropriate.     Data Reviewed: I have personally reviewed following labs and imaging studies  CBC:  Recent Labs Lab 03/26/17 2339 03/28/17 0357 03/29/17 0315 03/30/17 0332  WBC 14.3* 14.3* 8.6 6.6  NEUTROABS 13.6*  --   --   --   HGB 13.1 12.9* 11.2* 11.8*  HCT 40.9 39.7 35.6* 37.4*  MCV 93.2 93.6 95.7 93.7  PLT 146* 162 158 154   Basic Metabolic Panel:  Recent Labs Lab 03/26/17 2339 03/28/17 0357 03/29/17 0315 03/30/17 0332  NA 140 141 138 140  K 4.3 4.0 3.9 3.7  CL 106 103 100* 102  CO2 24 28 31 30   GLUCOSE 264* 211* 177* 181*  BUN 33* 45* 51* 47*  CREATININE 1.27* 1.50* 1.66* 1.54*  CALCIUM 9.3 9.2 8.6* 8.8*  MG  --  2.3  --   --    GFR: Estimated Creatinine Clearance: 61 mL/min (A) (by C-G formula based on SCr of 1.54 mg/dL (H)). Liver Function Tests:  Recent Labs Lab 03/26/17 2339  AST 19  ALT 22  ALKPHOS 73  BILITOT 0.9  PROT 7.4  ALBUMIN 3.8   No results for input(s): LIPASE, AMYLASE in the last 168 hours. No results for input(s): AMMONIA in the last 168 hours. Coagulation Profile:  Recent Labs Lab 03/26/17 2339  INR 1.09   Cardiac Enzymes:  Recent Labs Lab 03/26/17 2339  TROPONINI 0.05*   BNP (last 3 results)  Recent Labs  02/12/17 1522  PROBNP 109   HbA1C: No results for input(s): HGBA1C in the last 72 hours. CBG:  Recent Labs Lab 03/29/17 0747 03/29/17 1124 03/29/17 1633 03/29/17 2136 03/30/17 0749  GLUCAP 185* 245* 174* 203* 199*   Lipid Profile: No results for input(s): CHOL, HDL, LDLCALC, TRIG, CHOLHDL, LDLDIRECT in the last 72 hours. Thyroid Function Tests:  Recent  Labs  03/27/17 1549  TSH 0.465   Anemia Panel: No results for input(s): VITAMINB12, FOLATE, FERRITIN, TIBC, IRON, RETICCTPCT in the last 72 hours. Sepsis Labs:  Recent Labs Lab 03/26/17 2339 03/27/17 0219  PROCALCITON 0.24  --   LATICACIDVEN 1.1 1.1    Recent Results (from the past 240 hour(s))  MRSA PCR Screening     Status: None   Collection Time: 03/26/17  9:58 PM  Result Value Ref Range Status   MRSA by PCR NEGATIVE NEGATIVE Final    Comment:        The GeneXpert MRSA Assay (FDA approved for NASAL specimens only), is one component of a comprehensive MRSA colonization surveillance program. It is not intended to diagnose MRSA infection nor to guide or monitor treatment for MRSA infections.   Culture, blood (x 2)     Status: None (Preliminary result)   Collection Time: 03/26/17 11:50 PM  Result Value Ref Range Status   Specimen Description BLOOD RIGHT ANTECUBITAL  Final  Special Requests BOTTLES DRAWN AEROBIC AND ANAEROBIC 10CC EACH  Final   Culture NO GROWTH 2 DAYS  Final   Report Status PENDING  Incomplete  Culture, blood (x 2)     Status: None (Preliminary result)   Collection Time: 03/26/17 11:59 PM  Result Value Ref Range Status   Specimen Description BLOOD LEFT ANTECUBITAL  Final   Special Requests BOTTLES DRAWN AEROBIC AND ANAEROBIC 5CC EACH  Final   Culture NO GROWTH 2 DAYS  Final   Report Status PENDING  Incomplete         Radiology Studies: Dg Chest 2 View  Result Date: 03/29/2017 CLINICAL DATA:  Shortness of breath, weakness, recent hx pna. Hx diabetes EXAM: CHEST  2 VIEW COMPARISON:  03/26/2017 FINDINGS: Lung volumes are low, similar to the previous exam. There is persistent lung base opacity consistent with atelectasis. Remainder of the lungs is clear. No apparent pleural effusion. No evidence of a pneumothorax. IMPRESSION: 1. No acute findings. 2. Low lung volumes and persistent lung base atelectasis. No significant change from the most recent  prior study. Electronically Signed   By: Lajean Manes M.D.   On: 03/29/2017 07:54        Scheduled Meds: . aspirin EC  81 mg Oral Daily  . baclofen  10 mg Oral TID  . chlorhexidine  15 mL Mouth Rinse BID  . enoxaparin (LOVENOX) injection  40 mg Subcutaneous Daily  . escitalopram  10 mg Oral Daily  . fesoterodine  8 mg Oral Daily  . furosemide  40 mg Intravenous BID  . gabapentin  400 mg Oral TID  . guaiFENesin  600 mg Oral BID  . hydrALAZINE  25 mg Oral Q8H  . insulin aspart  0-9 Units Subcutaneous TID WC  . levofloxacin  500 mg Oral Daily  . levothyroxine  175 mcg Oral QAC breakfast  . losartan  50 mg Oral Daily  . mouth rinse  15 mL Mouth Rinse q12n4p  . pramipexole  0.5 mg Oral BID  . sodium chloride flush  3 mL Intravenous Q12H  . tamsulosin  0.4 mg Oral Daily   Continuous Infusions:   LOS: 4 days    Finnigan Warriner Tanna Furry, MD [facility] Pager [phone]  If 7PM-7AM, please contact night-coverage [email or web address] Password [facility] 03/30/2017, 10:43 AM

## 2017-03-30 NOTE — Progress Notes (Signed)
Oxygen qualification with PT  SATURATION QUALIFICATIONS: (This note is used to comply with regulatory documentation for home oxygen)  Patient Saturations on Room Air at Rest = 92%  Patient Saturations on Room Air while Ambulating = 88%  Patient Saturations on 2 Liters of oxygen while Ambulating = 94%  Please briefly explain why patient needs home oxygen: Desat with activity whn on RA as well as increased dizziness.   Leighton Roach, [place]  [phone]

## 2017-03-30 NOTE — Progress Notes (Signed)
Progress Note  Patient Name: Travis [name] Date of Encounter: 03/30/2017  Primary Cardiologist: Dr. Acie Fredrickson  Subjective   Still has le edema. Just came back from PT. Intermittent dyspnea.   Inpatient Medications    Scheduled Meds: . aspirin EC  81 mg Oral Daily  . baclofen  10 mg Oral TID  . chlorhexidine  15 mL Mouth Rinse BID  . enoxaparin (LOVENOX) injection  40 mg Subcutaneous Daily  . escitalopram  10 mg Oral Daily  . fesoterodine  8 mg Oral Daily  . furosemide  40 mg Intravenous BID  . gabapentin  400 mg Oral TID  . guaiFENesin  600 mg Oral BID  . hydrALAZINE  25 mg Oral Q8H  . insulin aspart  0-9 Units Subcutaneous TID WC  . levofloxacin  500 mg Oral Daily  . levothyroxine  175 mcg Oral QAC breakfast  . losartan  50 mg Oral Daily  . mouth rinse  15 mL Mouth Rinse q12n4p  . pramipexole  0.5 mg Oral BID  . sodium chloride flush  3 mL Intravenous Q12H  . tamsulosin  0.4 mg Oral Daily   Continuous Infusions:  PRN Meds: acetaminophen **OR** acetaminophen, ipratropium-albuterol, LORazepam, ondansetron **OR** ondansetron (ZOFRAN) IV, polyethylene glycol, sodium chloride, sodium chloride flush   Vital Signs    Vitals:   03/29/17 2137 03/30/17 0200 03/30/17 0536 03/30/17 0543  BP: (!) 146/54   (!) 138/57  Pulse:    78  Resp:      Temp: 99.9 F (37.7 C) 97.9 F (36.6 C)  98 F (36.7 C)  TempSrc: Oral Oral  Oral  SpO2: 97%   90%  Weight:   284 lb 8 oz (129 kg)   Height:        Intake/Output Summary (Last 24 hours) at 03/30/17 1023 Last data filed at 03/30/17 0800  Gross per 24 hour  Intake              960 ml  Output             1375 ml  Net             -415 ml   Filed Weights   03/28/17 0352 03/29/17 0700 03/30/17 0536  Weight: 282 lb (127.9 kg) 282 lb 8 oz (128.1 kg) 284 lb 8 oz (129 kg)    Telemetry    NSR  - Personally Reviewed  ECG    None today   Physical Exam   GEN: No acute distress.   Neck: No JVD Cardiac: RRR, no murmurs, rubs,  or gallops. 2+ BL LE edema Respiratory: Clear to auscultation bilaterally. GI: Soft, nontender, non-distended  MS: No edema; No deformity. Neuro:  Nonfocal  Psych: Normal affect   Labs    Chemistry Recent Labs Lab 03/26/17 2339 03/28/17 0357 03/29/17 0315 03/30/17 0332  NA 140 141 138 140  K 4.3 4.0 3.9 3.7  CL 106 103 100* 102  CO2 24 28 31 30   GLUCOSE 264* 211* 177* 181*  BUN 33* 45* 51* 47*  CREATININE 1.27* 1.50* 1.66* 1.54*  CALCIUM 9.3 9.2 8.6* 8.8*  PROT 7.4  --   --   --   ALBUMIN 3.8  --   --   --   AST 19  --   --   --   ALT 22  --   --   --   ALKPHOS 73  --   --   --   BILITOT 0.9  --   --   --  GFRNONAA 55* 45* 40* 43*  GFRAA >60 52* 46* 50*  ANIONGAP 10 10 7 8      Hematology Recent Labs Lab 03/28/17 0357 03/29/17 0315 03/30/17 0332  WBC 14.3* 8.6 6.6  RBC 4.24 3.72* 3.99*  HGB 12.9* 11.2* 11.8*  HCT 39.7 35.6* 37.4*  MCV 93.6 95.7 93.7  MCH 30.4 30.1 29.6  MCHC 32.5 31.5 31.6  RDW 15.4 15.3 15.0  PLT 162 158 161    Cardiac Enzymes Recent Labs Lab 03/26/17 2339  TROPONINI 0.05*   No results for input(s): TROPIPOC in the last 168 hours.   BNP Recent Labs Lab 03/26/17 2339  BNP 265.1*     DDimer No results for input(s): DDIMER in the last 168 hours.   Radiology    Dg Chest 2 View  Result Date: 03/29/2017 CLINICAL DATA:  Shortness of breath, weakness, recent hx pna. Hx diabetes EXAM: CHEST  2 VIEW COMPARISON:  03/26/2017 FINDINGS: Lung volumes are low, similar to the previous exam. There is persistent lung base opacity consistent with atelectasis. Remainder of the lungs is clear. No apparent pleural effusion. No evidence of a pneumothorax. IMPRESSION: 1. No acute findings. 2. Low lung volumes and persistent lung base atelectasis. No significant change from the most recent prior study. Electronically Signed   By: Lajean Manes M.D.   On: 03/29/2017 07:54    Cardiac Studies   Echo 03/06/17 Study Conclusions  - Left ventricle: The  cavity size was normal. Wall thickness was   increased in a pattern of mild LVH. Systolic function was   vigorous. The estimated ejection fraction was in the range of 65%   to 70%. Wall motion was normal; there were no regional wall   motion abnormalities. Doppler parameters are consistent with   pseudonormal left ventricular relaxation (grade 2 diastolic   dysfunction). The E/A ratio is >1.5. The E/e&' ratio is >15,   suggesting elevated LV filling pressure. - Left atrium: The atrium was mildly dilated. - Inferior vena cava: The vessel was normal in size. The   respirophasic diameter changes were in the normal range (>= 50%),   consistent with normal central venous pressure.  Impressions:  - Compared to a prior study in 2016, there is no significant   change. LV filling pressure is elevated.   Patient Profile     Travis [name] is a 71 y.o. male with a history of chronic diastolic HF, chronic renal insufficieny, HTN, hypothyroidism, OSA, former smoker and poorly controlled type 2 DM admitted with ~30L weight gain and sob in setting of pneumonia.   Assessment & Plan    Acute on chronic diastolic heart failure - He has gained approximately 30 pounds in past 1 month basis on our records in setting of dietary noncompliance (eating to much salt).  - Last echocardiogram was from 03/06/2017 with EF of 65-70% and grade 2 diastolic dysfunction - BNP 265.  He was started on IV Lasix 40 mg twice a day. Breathing improved. Net I & O negative 1.9L. Wight up 2 lb in past 2 days. Seem inaccurate mesurement. He still have significant edema on his legs. Unable to fit TED. Seems patient will needs higher dose of diuretics.   2. HTN - improved on current regimen.   3. Non obstructive CAD - No angina. Continue ASA.   4. Chronic Renal Insufficiency, stage III - SCr baseline around 1.4-1.6. Stable  5. Pneumonia and UTI  - per primary team   Signed, [place], [place]  03/30/2017, 10:23  AM    Patient seen and examined  Case reviewed with B Bhagat.  I agree with findings as noted above  Pt is a 71 yo with hx of diastolic CHF  Admitted with wt gain and volume overload  He is currently diuresing ON exam:  JVP is increased  Lungs Rel clear  Cardiac exam:  RRR  No signif murmur  Ext with 1+ edema    I would continue diuresing with IV lasix  Would increase lasix to 80 bid  Follow I/O Cr is stable  . [name] Dorris Carnes

## 2017-03-30 NOTE — Progress Notes (Signed)
Inpatient Diabetes Program Recommendations  AACE/ADA: New Consensus Statement on Inpatient Glycemic Control (2015)  Target Ranges:  Prepandial:   less than 140 mg/dL      Peak postprandial:   less than 180 mg/dL (1-2 hours)      Critically ill patients:  140 - 180 mg/dL   Results for LISLE, SKILLMAN (MRN [id number]) as of 03/30/2017 11:00  Ref. Range 03/29/2017 07:47 03/29/2017 11:24 03/29/2017 16:33 03/29/2017 21:36 03/30/2017 07:49  Glucose-Capillary Latest Ref Range: 65 - 99 mg/dL 185 (H) 245 (H) 174 (H) 203 (H) 199 (H)    Admit with: SOB  History: DM, CHF, CKD  Home DM Meds: Amaryl 2 mg daily  Current Insulin Orders: Novolog Sensitive Correction Scale/ SSI (0-9 units) TID AC      MD- Please consider the following in-hospital insulin adjustments while pt's home oral DM meds are on hold:  1. Start Lantus 12 units QHS (0.1 units/kg dosing based on weight of 129 kg)  2. Check Current Hemoglobin A1c level     --Will follow patient during hospitalization--  Wyn Quaker RN, MSN, CDE Diabetes Coordinator Inpatient Glycemic Control Team Team Pager: [phone] (8a-5p)

## 2017-03-30 NOTE — Progress Notes (Addendum)
Occupational Therapy Treatment Patient Details Name: Travis [name] MRN: [id number] DOB: 10/03/1946 Today's Date: 03/30/2017    History of present illness Pt is a 71 y.o. male admitted to ED on 03/26/17 with worsening SOB, fever, chills and vomiting; workup revealed acute resp failure with CAP. CXR shows low lung volumes and bibasilar atelectasis. Pt being seen at OP neuro PT from 3/9-3/23 for gait mechanics and balance. Pertinent PMH includes CHF, chronic renal insufficiency, HTN, OSA, poorly controlled DM, cervical myelopathy.    OT comments  Pt performed 2 grooming activities in standing and practiced use of AE for LB ADL. Educated pt in features to look for when purchasing a shower seat. Pt continues to require significant assistance for sit>stand. Recommending SNF for further therapy, but pt prefers to go home. He is agreeable to home therapies until he can return to OPPT.  Follow Up Recommendations  Home Health OT    Equipment Recommendations  Tub/shower seat (pt to get on his own)    Recommendations for Other Services      Precautions / Restrictions Precautions Precautions: Fall Restrictions Weight Bearing Restrictions: No       Mobility Bed Mobility               General bed mobility comments: Pt sitting in chair   Transfers Overall transfer level: Needs assistance Equipment used: Rolling walker (2 wheeled) Transfers: Sit to/from Stand Sit to Stand: Mod assist         General transfer comment: Pt requires mod A and increased time to move sit to stand from lower chair, used momentum, placed pillows in bottom of chair     Balance     Sitting balance-Leahy Scale: Fair     Standing balance support: Bilateral upper extremity supported Standing balance-Leahy Scale: Poor Standing balance comment: Reliant on at least 1 UE support in static standing                           ADL either performed or assessed with clinical judgement   ADL Overall  ADL's : Needs assistance/impaired     Grooming: Brushing hair;Oral care;Standing;Minimal assistance Grooming Details (indicate cue type and reason): pt requires one hand support when standing, assisted to place toothpaste on toothbrush         Upper Body Dressing : Supervision/safety;Sitting Upper Body Dressing Details (indicate cue type and reason): front opening gown Lower Body Dressing: Moderate assistance;Sit to/from stand Lower Body Dressing Details (indicate cue type and reason): practiced use of reacher and sock aide, pt does not have adequate standing balance to release walker in standing to pull pants up             Functional mobility during ADLs: Min guard;Rolling walker       Vision       Perception     Praxis      Cognition Arousal/Alertness: Awake/alert Behavior During Therapy: WFL for tasks assessed/performed Overall Cognitive Status: Within Functional Limits for tasks assessed                                          Exercises     Shoulder Instructions       General Comments      Pertinent Vitals/ Pain       Pain Assessment: No/denies pain  Home Living  Prior Functioning/Environment              Frequency  Min 2X/week        Progress Toward Goals  OT Goals(current goals can now be found in the care plan section)  Progress towards OT goals: Progressing toward goals  Acute Rehab OT Goals Patient Stated Goal: Return home Time For Goal Achievement: 04/12/17 Potential to Achieve Goals: Good  Plan Discharge plan remains appropriate    Co-evaluation                 End of Session Equipment Utilized During Treatment: Rolling walker;Oxygen;Gait belt (1.5L)  OT Visit Diagnosis: Unsteadiness on feet (R26.81);Muscle weakness (generalized) (M62.81)   Activity Tolerance Patient tolerated treatment well   Patient Left in chair;with call bell/phone  within reach   Nurse Communication Mobility status        Time: 2836-6294 OT Time Calculation (min): 44 min  Charges: OT General Charges $OT Visit: 1 Procedure OT Treatments $Self Care/Home Management : 38-52 mins     Malka So 03/30/2017, 9:57 AM  [phone]

## 2017-03-30 NOTE — Care Management Important Message (Signed)
Important Message  Patient Details  Name: Travis [name] MRN: [id number] Date of Birth: 09/22/1946   Medicare Important Message Given:  Yes    Tomma Ehinger Montine Circle 03/30/2017, 4:34 PM

## 2017-03-31 ENCOUNTER — Encounter: Payer: Medicare Other | Admitting: Physical Therapy

## 2017-03-31 LAB — BASIC METABOLIC PANEL
ANION GAP: 8 (ref 5–15)
BUN: 46 mg/dL — ABNORMAL HIGH (ref 6–20)
CO2: 28 mmol/L (ref 22–32)
Calcium: 8.7 mg/dL — ABNORMAL LOW (ref 8.9–10.3)
Chloride: 103 mmol/L (ref 101–111)
Creatinine, Ser: 1.42 mg/dL — ABNORMAL HIGH (ref 0.61–1.24)
GFR calc non Af Amer: 48 mL/min — ABNORMAL LOW (ref >60)
GFR, EST AFRICAN AMERICAN: 55 mL/min — AB (ref >60)
GLUCOSE: 188 mg/dL — AB (ref 65–99)
POTASSIUM: 3.6 mmol/L (ref 3.5–5.1)
Sodium: 139 mmol/L (ref 135–145)

## 2017-03-31 LAB — GLUCOSE, CAPILLARY
GLUCOSE-CAPILLARY: 240 mg/dL — AB (ref 65–99)
Glucose-Capillary: 218 mg/dL — ABNORMAL HIGH (ref 65–99)
Glucose-Capillary: 144 mg/dL — ABNORMAL HIGH (ref 65–99)
Glucose-Capillary: 228 mg/dL — ABNORMAL HIGH (ref 65–99)

## 2017-03-31 NOTE — Progress Notes (Signed)
Pharmacy Antibiotic Note  Travis [name] is a 71 y.o. male admitted on 03/26/2017 with pneumonia and UTI.  Pt was initially started on ceftriaxone and azithromycin for CAP but a urine culture from [facility] grew Pseudomonas sensitive to ciprofloxacin, so will transition to levofloxacin monotherapy.  Changed from IV to PO Levaquin on 4/1 due to infusion site reaction, which resolved. Tolerating PO Levaquin.   Day # 5 antibiotics. Now afebrile, off O2. No growth from blood cultures to date.  Plan:  Continue Levaquin 500 mg PO daily at 8pm.  Follow renal function, final cultures, length of therapy.  Height: 6\' 1"  (185.4 cm) Weight: 288 lb (130.6 kg) IBW/kg (Calculated) : 79.9  Temp (24hrs), Avg:98.8 F (37.1 C), Min:98.8 F (37.1 C), Max:98.8 F (37.1 C)   Recent Labs Lab 03/26/17 2339 03/27/17 0219 03/28/17 0357 03/29/17 0315 03/30/17 0332 03/31/17 0459  WBC 14.3*  --  14.3* 8.6 6.6  --   CREATININE 1.27*  --  1.50* 1.66* 1.54* 1.42*  LATICACIDVEN 1.1 1.1  --   --   --   --     Estimated Creatinine Clearance: 66.6 mL/min (A) (by C-G formula based on SCr of 1.42 mg/dL (H)).    Allergies  Allergen Reactions  . Other Other (See Comments)    Anesthesia makes sick, must have antinausea medication in advance.  . Levofloxacin In D5w Itching    Possible infusion reaction 03/29/17. Tolerating oral Levaquin 03/30/17    Antimicrobials this admission: Azithromycin 3/30 >>3/31 Ceftriaxone  3/30 >>3/31 Levofloxacin 3/31 >>  Microbiology results:  3/29 Blood x 2 - no growth x 4 days so far 3/29 MRSA screen negative 3/29 [facility]): Pseudomonas (sensitive to Cipro) Influenza panel negative at outside hospital    Thank you for allowing pharmacy to be a part of this patient's care.  Arty Baumgartner, [place] Pager: [phone] 03/31/2017 2:40 PM

## 2017-03-31 NOTE — Progress Notes (Signed)
Progress Note  Patient Name: Travis [name] Date of Encounter: 03/31/2017  Primary Cardiologist: Dr. Acie Fredrickson  Subjective   Edema improved significantly on higher dose of lasix. No chest pain. Dyspnea almost completely resolved.   Inpatient Medications    Scheduled Meds: . aspirin EC  81 mg Oral Daily  . baclofen  10 mg Oral TID  . chlorhexidine  15 mL Mouth Rinse BID  . enoxaparin (LOVENOX) injection  40 mg Subcutaneous Daily  . escitalopram  10 mg Oral Daily  . fesoterodine  8 mg Oral Daily  . furosemide  80 mg Intravenous BID  . gabapentin  400 mg Oral TID  . guaiFENesin  600 mg Oral BID  . hydrALAZINE  25 mg Oral Q8H  . insulin aspart  0-9 Units Subcutaneous TID WC  . levofloxacin  500 mg Oral Daily  . levothyroxine  175 mcg Oral QAC breakfast  . losartan  50 mg Oral Daily  . mouth rinse  15 mL Mouth Rinse q12n4p  . pramipexole  0.5 mg Oral BID  . sodium chloride flush  3 mL Intravenous Q12H  . tamsulosin  0.4 mg Oral Daily   Continuous Infusions:  PRN Meds: acetaminophen **OR** acetaminophen, ipratropium-albuterol, LORazepam, ondansetron **OR** ondansetron (ZOFRAN) IV, polyethylene glycol, sodium chloride, sodium chloride flush   Vital Signs    Vitals:   03/30/17 1707 03/30/17 2028 03/30/17 2258 03/31/17 0500  BP: (!) 141/63 (!) 138/54  (!) 140/58  Pulse:  77 78 63  Resp:  20 18 18   Temp:  98.8 F (37.1 C)  98.8 F (37.1 C)  TempSrc:  Oral  Oral  SpO2:  93% 92% 91%  Weight:    288 lb (130.6 kg)  Height:        Intake/Output Summary (Last 24 hours) at 03/31/17 0943 Last data filed at 03/31/17 0839  Gross per 24 hour  Intake              915 ml  Output             3350 ml  Net            -2435 ml   Filed Weights   03/29/17 0700 03/30/17 0536 03/31/17 0500  Weight: 282 lb 8 oz (128.1 kg) 284 lb 8 oz (129 kg) 288 lb (130.6 kg)    Telemetry    NSR with PVCs  - Personally Reviewed  ECG    None today   Physical Exam   GEN: No acute distress.    Neck: No JVD Cardiac: RRR, no murmurs, rubs, or gallops. Trace to 1+ BL LE edema Respiratory: Clear to auscultation bilaterally. GI: Soft, nontender, non-distended  MS: No edema; No deformity. Neuro:  Nonfocal  Psych: Normal affect   Labs    Chemistry Recent Labs Lab 03/26/17 2339  03/29/17 0315 03/30/17 0332 03/31/17 0459  NA 140  < > 138 140 139  K 4.3  < > 3.9 3.7 3.6  CL 106  < > 100* 102 103  CO2 24  < > 31 30 28   GLUCOSE 264*  < > 177* 181* 188*  BUN 33*  < > 51* 47* 46*  CREATININE 1.27*  < > 1.66* 1.54* 1.42*  CALCIUM 9.3  < > 8.6* 8.8* 8.7*  PROT 7.4  --   --   --   --   ALBUMIN 3.8  --   --   --   --   AST 19  --   --   --   --  ALT 22  --   --   --   --   ALKPHOS 73  --   --   --   --   BILITOT 0.9  --   --   --   --   GFRNONAA 55*  < > 40* 43* 48*  GFRAA >60  < > 46* 50* 55*  ANIONGAP 10  < > 7 8 8   < > = values in this interval not displayed.   Hematology  Recent Labs Lab 03/28/17 0357 03/29/17 0315 03/30/17 0332  WBC 14.3* 8.6 6.6  RBC 4.24 3.72* 3.99*  HGB 12.9* 11.2* 11.8*  HCT 39.7 35.6* 37.4*  MCV 93.6 95.7 93.7  MCH 30.4 30.1 29.6  MCHC 32.5 31.5 31.6  RDW 15.4 15.3 15.0  PLT 162 158 161    Cardiac Enzymes  Recent Labs Lab 03/26/17 2339  TROPONINI 0.05*   No results for input(s): TROPIPOC in the last 168 hours.   BNP  Recent Labs Lab 03/26/17 2339  BNP 265.1*     DDimer No results for input(s): DDIMER in the last 168 hours.   Radiology    No results found.  Cardiac Studies   Echo 03/06/17 Study Conclusions  - Left ventricle: The cavity size was normal. Wall thickness was   increased in a pattern of mild LVH. Systolic function was   vigorous. The estimated ejection fraction was in the range of 65%   to 70%. Wall motion was normal; there were no regional wall   motion abnormalities. Doppler parameters are consistent with   pseudonormal left ventricular relaxation (grade 2 diastolic   dysfunction). The E/A ratio  is >1.5. The E/e&' ratio is >15,   suggesting elevated LV filling pressure. - Left atrium: The atrium was mildly dilated. - Inferior vena cava: The vessel was normal in size. The   respirophasic diameter changes were in the normal range (>= 50%),   consistent with normal central venous pressure.  Impressions:  - Compared to a prior study in 2016, there is no significant   change. LV filling pressure is elevated.   Patient Profile     Travis [name] is a 71 y.o. male with a history of chronic diastolic HF, chronic renal insufficieny, HTN, hypothyroidism, OSA, former smoker and poorly controlled type 2 DM admitted with ~30L weight gain and sob in setting of pneumonia.   Assessment & Plan    1. Acute on chronic diastolic heart failure - He has gained approximately 30 pounds in past 1 month basis on our records in setting of dietary noncompliance (eating to much salt).  - Last echocardiogram was from 03/06/2017 with EF of 65-70% and grade 2 diastolic dysfunction - BNP 265.  He was started on IV Lasix 40 mg twice a day but not much improved. He diuresed yesterday 2.3L on higher dose of lasix. Edema improved singificanlty. However weight is up 4 lb since yesterday.  Seem inaccurate mesurement.  - Continue IV lasix 80mg  BId today, Change to Po lasix vs torsemide (home dose 20mg  BID --> will need higher dose) tomorrow.   2. HTN - improved on current regimen.   3. Non obstructive CAD - No angina. Continue ASA.   4. Chronic Renal Insufficiency, stage III - SCr baseline around 1.4-1.6. Scr improved to 1.42 with higher dose of lasix.   5. Pneumonia and UTI  - per primary team   Signed, Bhagat,Bhavinkumar, PA  03/31/2017, 9:43 AM    Pt seen and examined  I agree with findings as noted above by B Bhagat\ Pt is diuresing with increased dose of lasix  ON exam   Neck FUll  Lungs are rel clear  Cardiac exam  RRR  N oS3   Ext with tr to 1+ edema  Improved from yesterday  I would ocntinue  IV lasix 80 bid today  Reassess in AM  Close to discharge.  Dorris Carnes

## 2017-03-31 NOTE — Progress Notes (Signed)
Orthopedic Tech Progress Note Patient Details:  Travis [name] Oct 07, 1946 [id number]  Patient ID: Travis [name], male   DOB: December 16, 1946, 71 y.o.   MRN: [id number]   [name] 03/31/2017, 12:50 PMCalled [facility] for foot and ankle orthosis.

## 2017-03-31 NOTE — Progress Notes (Signed)
Inpatient Diabetes Program Recommendations  AACE/ADA: New Consensus Statement on Inpatient Glycemic Control (2015)  Target Ranges:  Prepandial:   less than 140 mg/dL      Peak postprandial:   less than 180 mg/dL (1-2 hours)      Critically ill patients:  140 - 180 mg/dL    Review of Glycemic Control  Diabetes history: DM 2 Home DM Meds: Amaryl 2 mg daily  Current Insulin Orders: Novolog Sensitive Correction Scale/ SSI (0-9 units) TID AC   MD- Please consider the following in-hospital insulin adjustments while pt's home oral DM meds are on hold:  1. Start Lantus 12 units QHS (0.1 units/kg dosing based on weight of 129 kg)  2. Check Current Hemoglobin A1c level  Thanks,  Tama Headings RN, MSN, [facility] Inpatient Diabetes Coordinator Team Pager [phone] (8a-5p)

## 2017-03-31 NOTE — Progress Notes (Signed)
PROGRESS NOTE    Travis [name]  TIW:[id number] DOB: 24-Jun-1946 DOA: 03/26/2017 PCP: Simona Huh, MD   Brief Narrative: 71 y.o. male with medical history significant of HTN, chronic diastolic CHF and EF 25-05% in 02/2017, CKD stage III, DM type 2, who presented to [facility] with shortness of breath and cough and fever. In the ER patient was found to havefever up to 102.29F, pulse 120, respirations 30, O2 saturations 89%, blood pressure 206/95. Patient was required placement to BiPAP with some improvement in respiratory distress. Chest x-ray showed low lung volumes with minimal bibasilar atelectasis. Influenza screen negative. Patient was started empirically on antibiotics of ceftriaxone and azithromycin. Family requested transfer to [facility] as patient's doctors are here.Followed by Dr. Cathie Olden of cardiology.  Assessment & Plan:   # Acute on chronic diastolic congestive heart failure: -Clinically improving, on room air today. LE edema is improving but the weight is going on ? Inaccurate measurement. Evaluated by Cardiology, plan to continue IV Lasix,  Losartan.  Diuretics managed by cardiology team. -Monitor BMP, strict ins and outs, daily weight -Cardiology consult appreciated. -Echocardiogram from 03/06/2017 with EF of 65-70% and grade 2 diastolic dysfunction. BNP 265 on admission.  #Possible community acquired pneumonia:  Respiratory symptoms improving. He had infusion site reaction with IV  levaquin, switched to oral levaquin and has been tolerating well. -Influenza negative at outside hospital. -afebrile. Cultures negative so far. Cough improved.  #Acute hypoxic respiratory failure: Off BiPAP and on room air now. Continue bronchodilators and breathing treatment. Check ambulatory oxygen saturation.  # pseudomonas UTI, site unspecified: pt has condom catheter. On levaquin since 3/31. Afebrile.  #Hypertensive: Monitor blood pressure. Continue Lasix, hydralazine,   losartan.  #Chronic kidney disease stage III: Serum creatinine stable at 1.4. Marland Kitchenon IV lasix. Continue to monitor BMP.  # type 2 DM: monitor blood sugar level. On insulin sliding scale.   #Nonobstructive coronary artery disease: No angina. Continue aspirin.  PT/OT evaluation done. Recommended rehab. Pt declined rehab and asking to go home on discharge.   Principal Problem:   Acute respiratory failure with hypoxia (HCC) Active Problems:   CAP (community acquired pneumonia)    Type II diabetes mellitus ([place])  DVT prophylaxis: Lovenox subcutaneous Code Status: Full code Family Communication: No family present at bedside. Disposition Plan: Likely discharge home with home care versus rehabilitation in 1-2 days.     Consultants:   Cardiology  Procedures: None Antimicrobials: Ceftriaxone and azithromycin since 3/30.3/31 levaquin since 3/31  Subjective: Patient was seen and examined at bedside. Sitting on chair. Denied chest pain, shortness of breath, nausea or vomiting. Objective: Vitals:   03/30/17 1707 03/30/17 2028 03/30/17 2258 03/31/17 0500  BP: (!) 141/63 (!) 138/54  (!) 140/58  Pulse:  77 78 63  Resp:  20 18 18   Temp:  98.8 F (37.1 C)  98.8 F (37.1 C)  TempSrc:  Oral  Oral  SpO2:  93% 92% 91%  Weight:    130.6 kg (288 lb)  Height:        Intake/Output Summary (Last 24 hours) at 03/31/17 1116 Last data filed at 03/31/17 0839  Gross per 24 hour  Intake              915 ml  Output             3350 ml  Net            -2435 ml   Filed Weights   03/29/17 0700 03/30/17 0536 03/31/17  0500  Weight: 128.1 kg (282 lb 8 oz) 129 kg (284 lb 8 oz) 130.6 kg (288 lb)    Examination:  General exam: Pleasant male sitting on chair, not in distress Respiratory system: Clear bilateral. Respiratory effort normal. No wheezing  Cardiovascular system: Regular rate and rhythm, S1-S2 normal Gastrointestinal system: Abdomen is soft, nontender. Bowel sound positive Central nervous  system: Alert and oriented. No focal neurological deficits. Extremities: Symmetric 5 x 5 power. Lower extremity edema improving. Skin: No rashes, lesions or ulcers Psychiatry: Judgement and insight appear normal. Mood & affect appropriate.     Data Reviewed: I have personally reviewed following labs and imaging studies  CBC:  Recent Labs Lab 03/26/17 2339 03/28/17 0357 03/29/17 0315 03/30/17 0332  WBC 14.3* 14.3* 8.6 6.6  NEUTROABS 13.6*  --   --   --   HGB 13.1 12.9* 11.2* 11.8*  HCT 40.9 39.7 35.6* 37.4*  MCV 93.2 93.6 95.7 93.7  PLT 146* 162 158 564   Basic Metabolic Panel:  Recent Labs Lab 03/26/17 2339 03/28/17 0357 03/29/17 0315 03/30/17 0332 03/31/17 0459  NA 140 141 138 140 139  K 4.3 4.0 3.9 3.7 3.6  CL 106 103 100* 102 103  CO2 24 28 31 30 28   GLUCOSE 264* 211* 177* 181* 188*  BUN 33* 45* 51* 47* 46*  CREATININE 1.27* 1.50* 1.66* 1.54* 1.42*  CALCIUM 9.3 9.2 8.6* 8.8* 8.7*  MG  --  2.3  --   --   --    GFR: Estimated Creatinine Clearance: 66.6 mL/min (A) (by C-G formula based on SCr of 1.42 mg/dL (H)). Liver Function Tests:  Recent Labs Lab 03/26/17 2339  AST 19  ALT 22  ALKPHOS 73  BILITOT 0.9  PROT 7.4  ALBUMIN 3.8   No results for input(s): LIPASE, AMYLASE in the last 168 hours. No results for input(s): AMMONIA in the last 168 hours. Coagulation Profile:  Recent Labs Lab 03/26/17 2339  INR 1.09   Cardiac Enzymes:  Recent Labs Lab 03/26/17 2339  TROPONINI 0.05*   BNP (last 3 results)  Recent Labs  02/12/17 1522  PROBNP 109   HbA1C: No results for input(s): HGBA1C in the last 72 hours. CBG:  Recent Labs Lab 03/30/17 0749 03/30/17 1131 03/30/17 1612 03/30/17 2155 03/31/17 0741  GLUCAP 199* 233* 172* 228* 218*   Lipid Profile: No results for input(s): CHOL, HDL, LDLCALC, TRIG, CHOLHDL, LDLDIRECT in the last 72 hours. Thyroid Function Tests: No results for input(s): TSH, T4TOTAL, FREET4, T3FREE, THYROIDAB in the  last 72 hours. Anemia Panel: No results for input(s): VITAMINB12, FOLATE, FERRITIN, TIBC, IRON, RETICCTPCT in the last 72 hours. Sepsis Labs:  Recent Labs Lab 03/26/17 2339 03/27/17 0219  PROCALCITON 0.24  --   LATICACIDVEN 1.1 1.1    Recent Results (from the past 240 hour(s))  MRSA PCR Screening     Status: None   Collection Time: 03/26/17  9:58 PM  Result Value Ref Range Status   MRSA by PCR NEGATIVE NEGATIVE Final    Comment:        The GeneXpert MRSA Assay (FDA approved for NASAL specimens only), is one component of a comprehensive MRSA colonization surveillance program. It is not intended to diagnose MRSA infection nor to guide or monitor treatment for MRSA infections.   Culture, blood (x 2)     Status: None (Preliminary result)   Collection Time: 03/26/17 11:50 PM  Result Value Ref Range Status   Specimen Description BLOOD RIGHT ANTECUBITAL  Final   Special Requests BOTTLES DRAWN AEROBIC AND ANAEROBIC 10CC EACH  Final   Culture NO GROWTH 3 DAYS  Final   Report Status PENDING  Incomplete  Culture, blood (x 2)     Status: None (Preliminary result)   Collection Time: 03/26/17 11:59 PM  Result Value Ref Range Status   Specimen Description BLOOD LEFT ANTECUBITAL  Final   Special Requests BOTTLES DRAWN AEROBIC AND ANAEROBIC 5CC EACH  Final   Culture NO GROWTH 3 DAYS  Final   Report Status PENDING  Incomplete         Radiology Studies: No results found.      Scheduled Meds: . aspirin EC  81 mg Oral Daily  . baclofen  10 mg Oral TID  . chlorhexidine  15 mL Mouth Rinse BID  . enoxaparin (LOVENOX) injection  40 mg Subcutaneous Daily  . escitalopram  10 mg Oral Daily  . fesoterodine  8 mg Oral Daily  . furosemide  80 mg Intravenous BID  . gabapentin  400 mg Oral TID  . guaiFENesin  600 mg Oral BID  . hydrALAZINE  25 mg Oral Q8H  . insulin aspart  0-9 Units Subcutaneous TID WC  . levofloxacin  500 mg Oral Daily  . levothyroxine  175 mcg Oral QAC  breakfast  . losartan  50 mg Oral Daily  . mouth rinse  15 mL Mouth Rinse q12n4p  . pramipexole  0.5 mg Oral BID  . sodium chloride flush  3 mL Intravenous Q12H  . tamsulosin  0.4 mg Oral Daily   Continuous Infusions:   LOS: 5 days    Tesha Archambeau Tanna Furry, MD [facility] Pager [phone]  If 7PM-7AM, please contact night-coverage [email or web address] Password [email or web address] 03/31/2017, 11:16 AM

## 2017-03-31 NOTE — Progress Notes (Signed)
qPhysical Therapy Treatment Patient Details Name: Travis [name] MRN: [id number] DOB: 29-Jul-1946 Today's Date: 03/31/2017    History of Present Illness Pt is a 71 y.o. male admitted to ED on 03/26/17 with worsening SOB, fever, chills and vomiting; workup revealed acute resp failure with CAP. CXR shows low lung volumes and bibasilar atelectasis. Pt being seen at OP neuro PT from 3/9-3/23 for gait mechanics and balance. Pertinent PMH includes CHF, chronic renal insufficiency, HTN, OSA, poorly controlled DM, cervical myelopathy.     PT Comments    When pt was at outpt neuro, AFO's had been discussed to help with ambulation, [employer] rep present today and felt that leather toe cap on pt's shoes as well as df assist strap would help more due to pt's hip flexor weakness. Family is getting new shoes for leather toe cap placement and Gerald Stabs will provide strap. Pt ambulated 200' without rest, min A by the end for R LE drag. VVS throughout on RA. PT will continue to follow.    Follow Up Recommendations  Home health PT;Supervision/Assistance - 24 hour     Equipment Recommendations  None recommended by PT    Recommendations for Other Services OT consult     Precautions / Restrictions Precautions Precautions: Fall Restrictions Weight Bearing Restrictions: No    Mobility  Bed Mobility               General bed mobility comments: Pt sitting in chair   Transfers Overall transfer level: Needs assistance Equipment used: Rolling walker (2 wheeled) Transfers: Sit to/from Stand Sit to Stand: Min assist         General transfer comment: min A for controlled stand to sit. Pt has difficulty sliding feet under him for sit to stand and felt that it was due to knee tightness. However, with further evaluation, he has the PROM but lacks the strength to pull feet back further. Discussed ways family can help him do this and tools he can use to do himself as well as hamstring strengthening exercises.    Ambulation/Gait Ambulation/Gait assistance: Min guard Ambulation Distance (Feet): 200 Feet Assistive device: Rolling walker (2 wheeled) Gait Pattern/deviations: Wide base of support;Decreased stride length;Trunk flexed;Step-through pattern Gait velocity: Decreased Gait velocity interpretation: <1.8 ft/sec, indicative of risk for recurrent falls General Gait Details: increased sliding of LLE with fatigue. Chris, from [place], present to evaluate for AFO. Decreased safety by end of ambulation and needing vc's for staying close to RW and attending more to LLE   Stairs            Wheelchair Mobility    Modified Rankin (Stroke Patients Only)       Balance Overall balance assessment: Needs assistance Sitting-balance support: Feet supported Sitting balance-Leahy Scale: Good     Standing balance support: Bilateral upper extremity supported Standing balance-Leahy Scale: Poor Standing balance comment: heavy lean onto RW                            Cognition Arousal/Alertness: Awake/alert Behavior During Therapy: WFL for tasks assessed/performed Overall Cognitive Status: Within Functional Limits for tasks assessed                                        Exercises General Exercises - Lower Extremity Straight Leg Raises: AROM;Both;10 reps;Seated    General Comments General comments (skin integrity,  edema, etc.): [employer] rep felt that AFO's are not going to help much given his hip flexor weakness so discussed leather toe cap and df assist strap with wife and they plan to go that route. discussed performing standing marching, before he has ambulated and is fatigued as well as bridging in bed. Needs to increase repetitions of all exercises and perform more than once/ day      Pertinent Vitals/Pain Pain Assessment: No/denies pain    Home Living                      Prior Function            PT Goals (current goals can now be found in the  care plan section) Acute Rehab PT Goals Patient Stated Goal: Return home PT Goal Formulation: With patient Time For Goal Achievement: 04/10/17 Potential to Achieve Goals: Fair Progress towards PT goals: Progressing toward goals    Frequency    Min 3X/week      PT Plan Current plan remains appropriate    Co-evaluation             End of Session Equipment Utilized During Treatment: Gait belt Activity Tolerance: Patient tolerated treatment well Patient left: in chair;with call bell/phone within reach Nurse Communication: Mobility status PT Visit Diagnosis: Muscle weakness (generalized) (M62.81);Other abnormalities of gait and mobility (R26.89)     Time: 3888-2800 PT Time Calculation (min) (ACUTE ONLY): 41 min  Charges:  $Gait Training: 8-22 mins $Therapeutic Exercise: 8-22 mins $Self Care/Home Management: 8-22                    G Codes:       Leighton Roach, PT  Acute Rehab Services  [place] 03/31/2017, 2:05 PM

## 2017-04-01 LAB — GLUCOSE, CAPILLARY
Glucose-Capillary: 202 mg/dL — ABNORMAL HIGH (ref 65–99)
Glucose-Capillary: 206 mg/dL — ABNORMAL HIGH (ref 65–99)

## 2017-04-01 LAB — CULTURE, BLOOD (ROUTINE X 2)
Culture: NO GROWTH
Culture: NO GROWTH

## 2017-04-01 LAB — BASIC METABOLIC PANEL
ANION GAP: 9 (ref 5–15)
BUN: 51 mg/dL — ABNORMAL HIGH (ref 6–20)
CALCIUM: 8.8 mg/dL — AB (ref 8.9–10.3)
CO2: 30 mmol/L (ref 22–32)
CREATININE: 1.52 mg/dL — AB (ref 0.61–1.24)
Chloride: 100 mmol/L — ABNORMAL LOW (ref 101–111)
GFR, EST AFRICAN AMERICAN: 51 mL/min — AB (ref >60)
GFR, EST NON AFRICAN AMERICAN: 44 mL/min — AB (ref >60)
GLUCOSE: 223 mg/dL — AB (ref 65–99)
Potassium: 3.9 mmol/L (ref 3.5–5.1)
Sodium: 139 mmol/L (ref 135–145)

## 2017-04-01 MED ORDER — LEVOFLOXACIN 500 MG PO TABS: 500.0000 mg | ORAL_TABLET | Freq: Every day | ORAL | 0 refills | Status: AC

## 2017-04-01 NOTE — Plan of Care (Signed)
Problem: Health Behavior/Discharge Planning: Goal: Ability to manage health-related needs will improve Outcome: Completed/Met Date Met: 04/01/17 All discharge needs were met. Pt did not qualify for home O2.   Problem: Activity: Goal: Risk for activity intolerance will decrease Outcome: Completed/Met Date Met: 04/01/17 Pt walked 100 ft with RN & tolerated it well. The lowest his O2 sat went down to was 92% on RA.

## 2017-04-01 NOTE — Progress Notes (Signed)
RT came to place patient on CPAP HS. Patient is already on HOME Unit. Patient tolerating well.

## 2017-04-01 NOTE — Discharge Instructions (Signed)
Community-Acquired Pneumonia, Adult °Pneumonia is an infection of the lungs. There are different types of pneumonia. One type can develop while a person is in a hospital. A different type, called community-acquired pneumonia, develops in people who are not, or have not recently been, in the hospital or other health care facility. °What are the causes? °Pneumonia may be caused by bacteria, viruses, or funguses. Community-acquired pneumonia is often caused by Streptococcus pneumonia bacteria. These bacteria are often passed from one person to another by breathing in droplets from the cough or sneeze of an infected person. °What increases the risk? °The condition is more likely to develop in: °· People who have chronic diseases, such as chronic obstructive pulmonary disease (COPD), asthma, congestive heart failure, cystic fibrosis, diabetes, or kidney disease. °· People who have early-stage or late-stage HIV. °· People who have sickle cell disease. °· People who have had their spleen removed (splenectomy). °· People who have poor dental hygiene. °· People who have medical conditions that increase the risk of breathing in (aspirating) secretions their own mouth and nose. °· People who have a weakened immune system (immunocompromised). °· People who smoke. °· People who travel to areas where pneumonia-causing germs commonly exist. °· People who are around animal habitats or animals that have pneumonia-causing germs, including birds, bats, rabbits, cats, and farm animals. ° °What are the signs or symptoms? °Symptoms of this condition include: °· A dry cough. °· A wet (productive) cough. °· Fever. °· Sweating. °· Chest pain, especially when breathing deeply or coughing. °· Rapid breathing or difficulty breathing. °· Shortness of breath. °· Shaking chills. °· Fatigue. °· Muscle aches. ° °How is this diagnosed? °Your health care provider will take a medical history and perform a physical exam. You may also have other tests,  including: °· Imaging studies of your chest, including X-rays. °· Tests to check your blood oxygen level and other blood gases. °· Other tests on blood, mucus (sputum), fluid around your lungs (pleural fluid), and urine. ° °If your pneumonia is severe, other tests may be done to identify the specific cause of your illness. °How is this treated? °The type of treatment that you receive depends on many factors, such as the cause of your pneumonia, the medicines you take, and other medical conditions that you have. For most adults, treatment and recovery from pneumonia may occur at home. In some cases, treatment must happen in a hospital. Treatment may include: °· Antibiotic medicines, if the pneumonia was caused by bacteria. °· Antiviral medicines, if the pneumonia was caused by a virus. °· Medicines that are given by mouth or through an IV tube. °· Oxygen. °· Respiratory therapy. ° °Although rare, treating severe pneumonia may include: °· Mechanical ventilation. This is done if you are not breathing well on your own and you cannot maintain a safe blood oxygen level. °· Thoracentesis. This procedure removes fluid around one lung or both lungs to help you breathe better. ° °Follow these instructions at home: °· Take over-the-counter and prescription medicines only as told by your health care provider. °? Only take cough medicine if you are losing sleep. Understand that cough medicine can prevent your body’s natural ability to remove mucus from your lungs. °? If you were prescribed an antibiotic medicine, take it as told by your health care provider. Do not stop taking the antibiotic even if you start to feel better. °· Sleep in a semi-upright position at night. Try sleeping in a reclining chair, or place a few pillows under your head. °· Do not use tobacco products, including cigarettes, chewing   tobacco, and e-cigarettes. If you need help quitting, ask your health care provider. °· Drink enough water to keep your urine  clear or pale yellow. This will help to thin out mucus secretions in your lungs. °How is this prevented? °There are ways that you can decrease your risk of developing community-acquired pneumonia. Consider getting a pneumococcal vaccine if: °· You are older than 73 years of age. °· You are older than 73 years of age and are undergoing cancer treatment, have chronic lung disease, or have other medical conditions that affect your immune system. Ask your health care provider if this applies to you. ° °There are different types and schedules of pneumococcal vaccines. Ask your health care provider which vaccination option is best for you. °You may also prevent community-acquired pneumonia if you take these actions: °· Get an influenza vaccine every year. Ask your health care provider which type of influenza vaccine is best for you. °· Go to the dentist on a regular basis. °· Wash your hands often. Use hand sanitizer if soap and water are not available. ° °Contact a health care provider if: °· You have a fever. °· You are losing sleep because you cannot control your cough with cough medicine. °Get help right away if: °· You have worsening shortness of breath. °· You have increased chest pain. °· Your sickness becomes worse, especially if you are an older adult or have a weakened immune system. °· You cough up blood. °This information is not intended to replace advice given to you by your health care provider. Make sure you discuss any questions you have with your health care provider. °Document Released: 12/15/2005 Document Revised: 04/24/2016 Document Reviewed: 04/11/2015 °Elsevier Interactive Patient Education © 2017 [employer] ° °

## 2017-04-01 NOTE — Care Management Note (Signed)
Case Management Note  Patient Details  Name: Travis [name] MRN: [id number] Date of Birth: [birth year]  Subjective/Objective:   CHF, Acute resp failure, PNA                   Action/Plan: Discharge Planning: AVS reviewed: NCM spoke to pt and offered choice for [facility]. Pt agreeable to [facility] for [facility]. Pt has RW, cane and wheelchair. Lives at home with wife. Still drives to his appts.   PCP [name] MD  Expected Discharge Date:  04/01/17               Expected Discharge Plan:  [place]  In-House Referral:  NA  Discharge planning Services  CM Consult  Post Acute Care Choice:  Home Health Choice offered to:  Patient  DME Arranged:  [name] DME Agency:  NA  HH Arranged:  RN, PT, OT, Nurse's Aide [place] Agency:  [facility] (formerly [facility])  Status of Service:  Completed, signed off  If discussed at [employer], dates discussed:    Additional Comments:  Erenest Rasher, RN 04/01/2017, 11:10 AM

## 2017-04-01 NOTE — Discharge Summary (Signed)
Physician Discharge Summary  Travis [name]:[id number] DOB: Jan 11, 1946 DOA: 03/26/2017  PCP: Simona Huh, MD  Admit date: 03/26/2017 Discharge date: 04/01/2017  Admitted From:home Disposition:home with home care  Recommendations for Outpatient Follow-up:  1. Follow up with PCP and cardiologist in 1-2 weeks 2. Please obtain BMP/CBC in one week  Home Health:yes Equipment/Devices:no Discharge Condition:stable CODE STATUS:full Diet recommendation:Carb modified heart healthy diet.  Brief/Interim Summary: 71 y.o.malewith medical history significant ofHTN, chronic diastolic CHF and EF 15-40% in 02/2017, CKD stage III, DM type 2, who presented to [facility] with shortness of breath and cough and fever. In the ER patient was found to havefever up to 102.58F, pulse 120, respirations 30, O2 saturations 89%, blood pressure 206/95. Patient was required placement to BiPAP with some improvement in respiratory distress. Chest x-ray showed low lung volumes with minimal bibasilar atelectasis. Influenza screen negative. Patient was started empirically on antibiotics of ceftriaxone and azithromycin. Family requested transfer to [facility] as patient's doctors are here.Followed by Dr. Cathie Olden of cardiology.  # Acute on chronic diastolic congestive heart failure: -Patient was treated with IV Lasix with significant clinical improvement. He is negative by 6 L net. Hypoxia improved. Lower extremity edema improved. Education provided to the patient regarding low salt diet and daily weight monitoring. He verbalized understanding. Discharged with oral diuretics potassium with close outpatient follow-up with cardiologist. Patient verbalized understanding. Patient was evaluated by cardiologist in the hospital. -Echocardiogram from 03/06/2017 with EF of 65-70% and grade 2 diastolic dysfunction. BNP 265 on admission.  #Possible community acquired pneumonia:  He had infusion site reaction with IV  levaquin  but tolerated oral Levaquin very well. Clinically improved. -Influenza negative at outside hospital. -afebrile. Cultures negative so far. Cough improved.  #Acute hypoxic respiratory failure: Off BiPAP and on room air now. Continue bronchodilators and breathing treatment.   # pseudomonas UTI, site unspecified: Discharged with 3 more days of oral Levaquin to complete the course.  #Hypertensive: Monitor blood pressure. Continue Lasix, hydralazine,  losartan.  #Chronic kidney disease stage III: Serum creatinine stable. Recommended to monitor labs in a week with PCP.  # type 2 DM: monitor blood sugar level. Resume home medications and recommended patient to follow-up with PCP.   #Nonobstructive coronary artery disease: No angina. Continue aspirin.  Patient is clinically improved. He has no chest pain, shortness of breath. Hypoxia improved. Lower extremity edema improved. Patient was evaluated by physical therapy and OT recommended rehabilitation however patient declined. He wanted to go home and therefore I ordered home care services. I reviewed the discharge medications and follow-up plan with the patient in detail. He verbalized understanding. At this time, patient is medically stable to transfer his care to outpatient.   Discharge Diagnoses:  Principal Problem:   Acute respiratory failure with hypoxia ([place]) Active Problems:   CAP (community acquired pneumonia)   Acute diastolic heart failure (HCC)   Type II diabetes mellitus (HCC)   Hyperthyroidism   Acute on chronic diastolic CHF (congestive heart failure) ([place])   Urinary tract infection without hematuria    Discharge Instructions  Discharge Instructions    (HEART FAILURE PATIENTS) Call MD:  Anytime you have any of the following symptoms: 1) 3 pound weight gain in 24 hours or 5 pounds in 1 week 2) shortness of breath, with or without a dry hacking cough 3) swelling in the hands, feet or stomach 4) if you have to sleep on extra  pillows at night in order to breathe.    Complete  by:  As directed    Call MD for:  difficulty breathing, headache or visual disturbances    Complete by:  As directed    Call MD for:  extreme fatigue    Complete by:  As directed    Call MD for:  hives    Complete by:  As directed    Call MD for:  persistant dizziness or light-headedness    Complete by:  As directed    Call MD for:  persistant nausea and vomiting    Complete by:  As directed    Call MD for:  severe uncontrolled pain    Complete by:  As directed    Call MD for:  temperature >100.4    Complete by:  As directed    Diet - low sodium heart healthy    Complete by:  As directed    Diet Carb Modified    Complete by:  As directed    Increase activity slowly    Complete by:  As directed      Allergies as of 04/01/2017      Reactions   Other Other (See Comments)   Anesthesia makes sick, must have antinausea medication in advance.   Levofloxacin In D5w Itching   Possible infusion reaction 03/29/17. Tolerating oral Levaquin 03/30/17      Medication List    STOP taking these medications   diclofenac 75 MG EC tablet Commonly known as:  VOLTAREN     TAKE these medications   aspirin 81 MG EC tablet Take 1 tablet (81 mg total) by mouth daily.   baclofen 10 MG tablet Commonly known as:  LIORESAL Take 1 tablet (10 mg total) by mouth 3 (three) times daily. For muscle spasms   escitalopram 10 MG tablet Commonly known as:  LEXAPRO Take 10 mg by mouth daily.   gabapentin 400 MG capsule Commonly known as:  NEURONTIN TAKE 1 CAPSULE (400 MG TOTAL) BY MOUTH EVERY 8 (EIGHT) HOURS.   glimepiride 2 MG tablet Commonly known as:  AMARYL Take 2 mg by mouth daily with breakfast.   hydrALAZINE 25 MG tablet Commonly known as:  APRESOLINE Take 1 tablet (25 mg total) by mouth every 8 (eight) hours.   levofloxacin 500 MG tablet Commonly known as:  LEVAQUIN Take 1 tablet (500 mg total) by mouth daily.   levothyroxine 175 MCG  tablet Commonly known as:  SYNTHROID, LEVOTHROID Take 175 mcg by mouth daily.   losartan 50 MG tablet Commonly known as:  COZAAR TAKE 1 TABLET BY MOUTH ONCE DAILY.   polyethylene glycol packet Commonly known as:  MIRALAX / GLYCOLAX Take 17 g by mouth daily as needed. For constipation What changed:  reasons to take this  additional instructions   potassium chloride 10 MEQ tablet Commonly known as:  KLOR-CON 10 Take 1 tablet (10 mEq total) by mouth 2 (two) times daily.   pramipexole 0.5 MG tablet Commonly known as:  MIRAPEX Take 0.5 mg by mouth 2 (two) times daily.   tamsulosin 0.4 MG Caps capsule Commonly known as:  FLOMAX Take 0.4 mg by mouth daily.   torsemide 20 MG tablet Commonly known as:  DEMADEX Take 1 tablet (20 mg total) by mouth 2 (two) times daily. Take additional 10 mg as needed for weight gain or shortness of breath.   TOVIAZ 8 MG Tb24 tablet Generic drug:  fesoterodine Take 8 mg by mouth daily.      Follow-up Information    Simona Huh, MD. Schedule  an appointment as soon as possible for a visit in 1 week(s).   Specialty:  Family Medicine Contact information: [address] [place] [address] [phone]        Mertie Moores, MD. Schedule an appointment as soon as possible for a visit in 1 week(s).   Specialty:  Cardiology Contact information: [address]  [place] [address] [phone]          Allergies  Allergen Reactions  . Other Other (See Comments)    Anesthesia makes sick, must have antinausea medication in advance.  . Levofloxacin In D5w Itching    Possible infusion reaction 03/29/17. Tolerating oral Levaquin 03/30/17    Consultations: Cardiologist  Procedures/Studies: None  Subjective: Patient was seen and examined at bedside. He reported doing very well. Denied headache, dizziness, nausea, vomiting, chest pain, shortness of breath. He wants to go home today.  Discharge  Exam: Vitals:   03/31/17 2245 04/01/17 0625  BP:  112/63  Pulse: 78   Resp: 18 16  Temp:  98.7 F (37.1 C)   Vitals:   03/31/17 2127 03/31/17 2146 03/31/17 2245 04/01/17 0625  BP: 121/61   112/63  Pulse:  75 78   Resp:  20 18 16   Temp:  98.6 F (37 C)  98.7 F (37.1 C)  TempSrc:  Oral  Oral  SpO2:  94% 94% 95%  Weight:    127.5 kg (281 lb)  Height:        General: Pt is alert, awake, not in acute distress Cardiovascular: RRR, S1/S2 +, no rubs, no gallops Respiratory: CTA bilaterally, no wheezing, no rhonchi Abdominal: Soft, NT, ND, bowel sounds + Extremities: no edema, no cyanosis    The results of significant diagnostics from this hospitalization (including imaging, microbiology, ancillary and laboratory) are listed below for reference.     Microbiology: Recent Results (from the past 240 hour(s))  MRSA PCR Screening     Status: None   Collection Time: 03/26/17  9:58 PM  Result Value Ref Range Status   MRSA by PCR NEGATIVE NEGATIVE Final    Comment:        The GeneXpert MRSA Assay (FDA approved for NASAL specimens only), is one component of a comprehensive MRSA colonization surveillance program. It is not intended to diagnose MRSA infection nor to guide or monitor treatment for MRSA infections.   Culture, blood (x 2)     Status: None   Collection Time: 03/26/17 11:50 PM  Result Value Ref Range Status   Specimen Description BLOOD RIGHT ANTECUBITAL  Final   Special Requests BOTTLES DRAWN AEROBIC AND ANAEROBIC 10CC EACH  Final   Culture NO GROWTH 5 DAYS  Final   Report Status 04/01/2017 FINAL  Final  Culture, blood (x 2)     Status: None   Collection Time: 03/26/17 11:59 PM  Result Value Ref Range Status   Specimen Description BLOOD LEFT ANTECUBITAL  Final   Special Requests BOTTLES DRAWN AEROBIC AND ANAEROBIC 5CC EACH  Final   Culture NO GROWTH 5 DAYS  Final   Report Status 04/01/2017 FINAL  Final     Labs: BNP (last 3 results)  Recent Labs   03/26/17 2339  BNP 676.7*   Basic Metabolic Panel:  Recent Labs Lab 03/28/17 0357 03/29/17 0315 03/30/17 0332 03/31/17 0459 04/01/17 0503  NA 141 138 140 139 139  K 4.0 3.9 3.7 3.6 3.9  CL 103 100* 102 103 100*  CO2 28 31 30  28 30  GLUCOSE 211* 177* 181* 188* 223*  BUN 45* 51* 47* 46* 51*  CREATININE 1.50* 1.66* 1.54* 1.42* 1.52*  CALCIUM 9.2 8.6* 8.8* 8.7* 8.8*  MG 2.3  --   --   --   --    Liver Function Tests:  Recent Labs Lab 03/26/17 2339  AST 19  ALT 22  ALKPHOS 73  BILITOT 0.9  PROT 7.4  ALBUMIN 3.8   No results for input(s): LIPASE, AMYLASE in the last 168 hours. No results for input(s): AMMONIA in the last 168 hours. CBC:  Recent Labs Lab 03/26/17 2339 03/28/17 0357 03/29/17 0315 03/30/17 0332  WBC 14.3* 14.3* 8.6 6.6  NEUTROABS 13.6*  --   --   --   HGB 13.1 12.9* 11.2* 11.8*  HCT 40.9 39.7 35.6* 37.4*  MCV 93.2 93.6 95.7 93.7  PLT 146* 162 158 161   Cardiac Enzymes:  Recent Labs Lab 03/26/17 2339  TROPONINI 0.05*   BNP: Invalid input(s): POCBNP CBG:  Recent Labs Lab 03/31/17 0741 03/31/17 1143 03/31/17 1752 03/31/17 2141 04/01/17 0736  GLUCAP 218* 240* 144* 228* 202*   D-Dimer No results for input(s): DDIMER in the last 72 hours. Hgb A1c No results for input(s): HGBA1C in the last 72 hours. Lipid Profile No results for input(s): CHOL, HDL, LDLCALC, TRIG, CHOLHDL, LDLDIRECT in the last 72 hours. Thyroid function studies No results for input(s): TSH, T4TOTAL, T3FREE, THYROIDAB in the last 72 hours.  Invalid input(s): FREET3 Anemia work up No results for input(s): VITAMINB12, FOLATE, FERRITIN, TIBC, IRON, RETICCTPCT in the last 72 hours. Urinalysis    Component Value Date/Time   COLORURINE YELLOW 03/27/2017 0608   APPEARANCEUR CLEAR 03/27/2017 0608   LABSPEC 1.013 03/27/2017 0608   PHURINE 5.0 03/27/2017 0608   GLUCOSEU >=500 (A) 03/27/2017 0608   HGBUR SMALL (A) 03/27/2017 0608   BILIRUBINUR NEGATIVE 03/27/2017 0608    KETONESUR NEGATIVE 03/27/2017 0608   PROTEINUR NEGATIVE 03/27/2017 0608   UROBILINOGEN 0.2 08/22/2015 1820   NITRITE NEGATIVE 03/27/2017 0608   LEUKOCYTESUR LARGE (A) 03/27/2017 0608   Sepsis Labs Invalid input(s): PROCALCITONIN,  WBC,  LACTICIDVEN Microbiology Recent Results (from the past 240 hour(s))  MRSA PCR Screening     Status: None   Collection Time: 03/26/17  9:58 PM  Result Value Ref Range Status   MRSA by PCR NEGATIVE NEGATIVE Final    Comment:        The GeneXpert MRSA Assay (FDA approved for NASAL specimens only), is one component of a comprehensive MRSA colonization surveillance program. It is not intended to diagnose MRSA infection nor to guide or monitor treatment for MRSA infections.   Culture, blood (x 2)     Status: None   Collection Time: 03/26/17 11:50 PM  Result Value Ref Range Status   Specimen Description BLOOD RIGHT ANTECUBITAL  Final   Special Requests BOTTLES DRAWN AEROBIC AND ANAEROBIC 10CC EACH  Final   Culture NO GROWTH 5 DAYS  Final   Report Status 04/01/2017 FINAL  Final  Culture, blood (x 2)     Status: None   Collection Time: 03/26/17 11:59 PM  Result Value Ref Range Status   Specimen Description BLOOD LEFT ANTECUBITAL  Final   Special Requests BOTTLES DRAWN AEROBIC AND ANAEROBIC 5CC EACH  Final   Culture NO GROWTH 5 DAYS  Final   Report Status 04/01/2017 FINAL  Final     Time coordinating discharge: 32 minutes  SIGNED:   Rosita Fire, MD  [facility] 04/01/2017, 11:00 AM  If 7PM-7AM, please contact night-coverage [email or web address] Password [email or web address]

## 2017-04-01 NOTE — Progress Notes (Signed)
Progress Note  Patient Name: Travis [name] Date of Encounter: 04/01/2017  Primary Cardiologist: nahser    Subjective   No CP   NO SOB    Inpatient Medications    Scheduled Meds: . aspirin EC  81 mg Oral Daily  . baclofen  10 mg Oral TID  . chlorhexidine  15 mL Mouth Rinse BID  . enoxaparin (LOVENOX) injection  40 mg Subcutaneous Daily  . escitalopram  10 mg Oral Daily  . fesoterodine  8 mg Oral Daily  . furosemide  80 mg Intravenous BID  . gabapentin  400 mg Oral TID  . guaiFENesin  600 mg Oral BID  . hydrALAZINE  25 mg Oral Q8H  . insulin aspart  0-9 Units Subcutaneous TID WC  . levofloxacin  500 mg Oral Daily  . levothyroxine  175 mcg Oral QAC breakfast  . losartan  50 mg Oral Daily  . mouth rinse  15 mL Mouth Rinse q12n4p  . pramipexole  0.5 mg Oral BID  . sodium chloride flush  3 mL Intravenous Q12H  . tamsulosin  0.4 mg Oral Daily   Continuous Infusions:  PRN Meds: acetaminophen **OR** acetaminophen, ipratropium-albuterol, LORazepam, ondansetron **OR** ondansetron (ZOFRAN) IV, polyethylene glycol, sodium chloride, sodium chloride flush   Vital Signs    Vitals:   03/31/17 2127 03/31/17 2146 03/31/17 2245 04/01/17 0625  BP: 121/61   112/63  Pulse:  75 78   Resp:  20 18 16   Temp:  98.6 F (37 C)  98.7 F (37.1 C)  TempSrc:  Oral  Oral  SpO2:  94% 94% 95%  Weight:    281 lb (127.5 kg)  Height:        Intake/Output Summary (Last 24 hours) at 04/01/17 0816 Last data filed at 04/01/17 0600  Gross per 24 hour  Intake              968 ml  Output             2325 ml  Net            -1357 ml   Net neg 5.9 L   Filed Weights   03/30/17 0536 03/31/17 0500 04/01/17 0625  Weight: 284 lb 8 oz (129 kg) 288 lb (130.6 kg) 281 lb (127.5 kg)    Telemetry    SR   - Personally Reviewed  ECG     Physical Exam   GEN: No acute distress.   Neck: No JVD Cardiac: RRR, no murmurs, rubs, or gallops.  Respiratory: Clear to auscultation bilaterally. GI: Soft,  nontender, non-distended  MS: Triv edema; No deformity. Neuro:  Nonfocal  Psych: Normal affect   Labs    Chemistry Recent Labs Lab 03/26/17 2339  03/30/17 0332 03/31/17 0459 04/01/17 0503  NA 140  < > 140 139 139  K 4.3  < > 3.7 3.6 3.9  CL 106  < > 102 103 100*  CO2 24  < > 30 28 30   GLUCOSE 264*  < > 181* 188* 223*  BUN 33*  < > 47* 46* 51*  CREATININE 1.27*  < > 1.54* 1.42* 1.52*  CALCIUM 9.3  < > 8.8* 8.7* 8.8*  PROT 7.4  --   --   --   --   ALBUMIN 3.8  --   --   --   --   AST 19  --   --   --   --   ALT 22  --   --   --   --  ALKPHOS 73  --   --   --   --   BILITOT 0.9  --   --   --   --   GFRNONAA 55*  < > 43* 48* 44*  GFRAA >60  < > 50* 55* 51*  ANIONGAP 10  < > 8 8 9   < > = values in this interval not displayed.   Hematology Recent Labs Lab 03/28/17 0357 03/29/17 0315 03/30/17 0332  WBC 14.3* 8.6 6.6  RBC 4.24 3.72* 3.99*  HGB 12.9* 11.2* 11.8*  HCT 39.7 35.6* 37.4*  MCV 93.6 95.7 93.7  MCH 30.4 30.1 29.6  MCHC 32.5 31.5 31.6  RDW 15.4 15.3 15.0  PLT 162 158 161    Cardiac Enzymes Recent Labs Lab 03/26/17 2339  TROPONINI 0.05*   No results for input(s): TROPIPOC in the last 168 hours.   BNP Recent Labs Lab 03/26/17 2339  BNP 265.1*     DDimer No results for input(s): DDIMER in the last 168 hours.   Radiology    No results found.  Cardiac Studies     Patient Profile     71 y.o.  Travis [name] a 71 y.o.malewith a history of chronic diastolic HF, chronic renal insufficieny, HTN, hypothyroidism, OSA, former smoker and poorly controlled type 2 DM admitted with ~30L weight gain and sob in setting of pneumonia.  Assessment & Plan    1  Acute on chronic diastolic CHF  Current exacerbation appears to be due to dietary noncompliance.  Eats out a lot   Pt planning to cook at [employer]oe more  Discussed diet I would keep on current medicines  Would switch back to torsemide bid 20 with K    Will make sure he has an appt in next few  wks in clinic  Weigh daily  Call if wt creeps up     2  HTN  BP is OK    3  Nonobstructive CAD  4  CKD    Signed, Dorris Carnes, MD  04/01/2017, 8:16 AM

## 2017-04-01 NOTE — Progress Notes (Signed)
Occupational Therapy Treatment Patient Details Name: Travis [name] MRN: [id number] DOB: 08-10-1946 Today's Date: 04/01/2017    History of present illness Pt is a 71 y.o. male admitted to ED on 03/26/17 with worsening SOB, fever, chills and vomiting; workup revealed acute resp failure with CAP. CXR shows low lung volumes and bibasilar atelectasis. Pt being seen at OP neuro PT from 3/9-3/23 for gait mechanics and balance. Pertinent PMH includes CHF, chronic renal insufficiency, HTN, OSA, poorly controlled DM, cervical myelopathy.    OT comments  Pt required mod assist for sit to stand from low chair, min assist from elevated BSC. Pt able to perform toilet transfer and shower transfer with min guard assist. Also required min guard for grooming activity standing at the sink. D/c plan remains appropriate. Will continue to follow acutely.   Follow Up Recommendations  Home health OT    Equipment Recommendations  Tub/shower seat    Recommendations for Other Services      Precautions / Restrictions Precautions Precautions: Fall Restrictions Weight Bearing Restrictions: No       Mobility Bed Mobility               General bed mobility comments: Pt OOB in chair upon arrival  Transfers Overall transfer level: Needs assistance Equipment used: Rolling walker (2 wheeled) Transfers: Sit to/from Stand Sit to Stand: Min assist;Mod assist         General transfer comment: Mod assist from low chair and min assist from elevated BSC. Good hand placement and technique but increased time required.    Balance Overall balance assessment: Needs assistance Sitting-balance support: Feet supported Sitting balance-Leahy Scale: Good     Standing balance support: No upper extremity supported;During functional activity Standing balance-Leahy Scale: Fair Standing balance comment: Able to stand at sink and wash hands without UE support                           ADL either  performed or assessed with clinical judgement   ADL Overall ADL's : Needs assistance/impaired     Grooming: Min guard;Standing;Wash/dry hands               Lower Body Dressing: Maximal assistance Lower Body Dressing Details (indicate cue type and reason): to don socks Toilet Transfer: Minimal assistance;Min guard;Ambulation;BSC;RW Toilet Transfer Details (indicate cue type and reason): Min assist for sit to stand from [facility], min guard for functional mobility to bathroom     Tub/ Shower Transfer: Min guard;Walk-in shower;Ambulation;Shower seat;Rolling walker   Functional mobility during ADLs: Therapist, art      Cognition Arousal/Alertness: Awake/alert Behavior During Therapy: WFL for tasks assessed/performed Overall Cognitive Status: Within Functional Limits for tasks assessed                                          Exercises     Shoulder Instructions       General Comments      Pertinent Vitals/ Pain       Pain Assessment: No/denies pain  Home Living  Prior Functioning/Environment              Frequency  Min 2X/week        Progress Toward Goals  OT Goals(current goals can now be found in the care plan section)  Progress towards OT goals: Progressing toward goals  Acute Rehab OT Goals Patient Stated Goal: home today OT Goal Formulation: With patient  Plan Discharge plan remains appropriate    Co-evaluation                 End of Session Equipment Utilized During Treatment: Gait belt;Rolling walker  OT Visit Diagnosis: Unsteadiness on feet (R26.81);Muscle weakness (generalized) (M62.81)   Activity Tolerance Patient tolerated treatment well   Patient Left in chair;with call bell/phone within reach   Nurse Communication          Time: 1203-1215 OT Time Calculation (min): 12 min  Charges: OT  General Charges $OT Visit: 1 Procedure OT Treatments $Self Care/Home Management : 8-22 mins  Malissia Rabbani A. Ulice Brilliant, M.S., OTR/L Pager: [place] 04/01/2017, 1:30 PM

## 2017-04-01 NOTE — Progress Notes (Signed)
SATURATION QUALIFICATIONS: (This note is used to comply with regulatory documentation for home oxygen)  Patient Saturations on Room Air at Rest = 94%  Patient Saturations on Room Air while Ambulating = 92-97%  Patient Saturations on 0 Liters of oxygen while Ambulating = 94%  Please briefly explain why patient needs home oxygen:

## 2017-04-02 ENCOUNTER — Telehealth: Payer: Self-pay | Admitting: Cardiovascular Disease

## 2017-04-02 ENCOUNTER — Encounter: Payer: Medicare Other | Admitting: Physical Therapy

## 2017-04-02 NOTE — Telephone Encounter (Signed)
Spoke with patient's wife about patient's appointment; she would like patient to see Dr. Acie Fredrickson next week. She states he was advised at the time of d/c from hospital that he should f/u with cardiologist in 1 week. I reviewed the patient's instructions which states 1-2 wk f/u with PCP and cardiology and advised that I can move his appointment to April 16 or 17 with Dr. Acie Fredrickson due to Dr. Acie Fredrickson will be assigned to hospital next week. I asked about patient's symptoms and wife denies any concerns at present. I advised her to call back if patient starts to gain weight, have increasing swelling or SOB,or other concerns. He is scheduled for April 17 with Dr. Acie Fredrickson.  She thanked me for the call.

## 2017-04-02 NOTE — Telephone Encounter (Signed)
Patient wife calling, states that her husband was recently in the hospital and told to f/u with Dr. Acie Fredrickson. Mrs. Ferrelli would like to know if patient could be worked in to come in sooner than 04-24-17. Thanks.

## 2017-04-07 ENCOUNTER — Encounter: Payer: Medicare Other | Admitting: Physical Therapy

## 2017-04-09 ENCOUNTER — Encounter: Payer: Medicare Other | Admitting: Physical Therapy

## 2017-04-14 ENCOUNTER — Other Ambulatory Visit: Payer: Self-pay

## 2017-04-14 ENCOUNTER — Encounter: Payer: Self-pay | Admitting: Cardiovascular Disease

## 2017-04-14 ENCOUNTER — Ambulatory Visit (INDEPENDENT_AMBULATORY_CARE_PROVIDER_SITE_OTHER): Payer: Medicare Other | Admitting: Cardiovascular Disease

## 2017-04-14 ENCOUNTER — Other Ambulatory Visit: Payer: Self-pay | Admitting: Cardiovascular Disease

## 2017-04-14 ENCOUNTER — Encounter: Payer: Medicare Other | Admitting: Physical Therapy

## 2017-04-14 VITALS — BP 120/50 | HR 55 | Ht 73.0 in | Wt 289.1 lb

## 2017-04-14 DIAGNOSIS — I5031 Acute diastolic (congestive) heart failure: Secondary | ICD-10-CM | POA: Diagnosis not present

## 2017-04-14 DIAGNOSIS — Z79899 Other long term (current) drug therapy: Secondary | ICD-10-CM

## 2017-04-14 DIAGNOSIS — I5032 Chronic diastolic (congestive) heart failure: Secondary | ICD-10-CM

## 2017-04-14 LAB — BASIC METABOLIC PANEL
BUN/Creatinine Ratio: 23 (ref 10–24)
BUN: 31 mg/dL — ABNORMAL HIGH (ref 8–27)
CALCIUM: 8.9 mg/dL (ref 8.6–10.2)
CHLORIDE: 102 mmol/L (ref 96–106)
CO2: 27 mmol/L (ref 18–29)
Creatinine, Ser: 1.34 mg/dL — ABNORMAL HIGH (ref 0.76–1.27)
GFR, EST AFRICAN AMERICAN: 61 mL/min/{1.73_m2} (ref >59)
GFR, EST NON AFRICAN AMERICAN: 53 mL/min/{1.73_m2} — AB (ref >59)
Glucose: 138 mg/dL — ABNORMAL HIGH (ref 65–99)
POTASSIUM: 4.5 mmol/L (ref 3.5–5.2)
Sodium: 144 mmol/L (ref 134–144)

## 2017-04-14 NOTE — Patient Instructions (Signed)
Medication Instructions:   NO CHANGE  Labwork:  Your physician recommends that you HAVE LAB WORK TODAY  Follow-Up:  Your physician recommends that you schedule a follow-up appointment in: [place] Acie Fredrickson

## 2017-04-14 NOTE — Progress Notes (Signed)
Cardiology Office Note   Date:  04/14/2017   ID:  Travis [name], Travis [name] [birth year], MRN [id number]  PCP:  Simona Huh, MD  Cardiologist:   Mertie Moores, MD   Chief Complaint  Patient presents with  . Congestive Heart Failure   1. Hypertension 2. Hypothyroidism 3. DM 4. OSA 5. Mild pulmonary hypertension.  6. Chronic diastolic dysfunction   History of Present Illness: Travis [name] is seen back today for a post hospital visit. He is the father - in -law of Travis [name]  (CCU nurse) and [name] ([place]) I saw him in consultatoin on July 17, 2014 for issues related to his diastolic dysfunction in the setting of pneumonia / respiratory failure He was seen By Truitt Merle in late August for follow up of his hospitalization.   [name] He is a 71 year old male with HTN, hypothyroidism, OSA, poorly controlled type 2 DM, former smoker and DJD.  Admitted back in July with CAP/UTI with associated sepsis. Was intubated due to progressive respiratory failure. Had AKI/ATN in the setting of PNA, hypoxic RF and septic shock. Felt to have some degree of diastolic HF - echo was not of good quality. Apparently has had past high sodium diet and uncontrolled HTN. Looks like he was on Lisinopril HCT, cardura and a lower dose of beta blocker. Mildly elevated troponin but no anginal symptoms and this was suspected to be from respiratory failure/CAP with septic shock.   Was seen by Cecille Rubin on August 28, Reported getting stronger, Breathing was ok. Leg edema has improved.   Sept. 9, 2015:  Travis [name] is seen today with his wife. Walking with a walker. Getting stronger every day.  Has cut out his high salt foods.  He i still working Administrator, [employer])  Has been seen by home health nurses - HR has been slow. No episodes of syncope.   Sept. 24, 2015:  Travis [name] is doing OK but continues to gradually gained strength. His home health nurse recently noted that his heart rate was  very slow. Occasionally have some episodes of very mild lightheadedness when he sat on the toilet type of bowel movement. He has not had episodes of syncope and seems to be doing pretty well at other times. He Has not had any syncope.   Dec. 11, 2015:  Travis [name] is here today for his HTN,  Has OSA - has been wearing his CPAP. , no CP and no dyspnea. Occasional eipisodes of orthostatic hypotension. Making progress with his rehab but he had to suspend it because of his elevated BP Does his best to avoid salt.  We reviewed his diet - no high salt items typically .  BP at the rehab is typically elevated but    March 09, 2015:   Travis [name] is a 71 y.o. male who presents for follow-up of his high blood pressure. He slowly getting stronger but still walks with the assistance of a walker. He has some left leg pain and he has had to re-start using his walker again - was using a cane.  Wears his CPAP at night.  BP yesterday was 242 -353 systolic .   He had some lightheadedness yesterday - glucose was ok.  BP was ok  August 17, 2015:  Doing well. Going to rehab -  BP looks great  No CP or dyspnea   Sept. 27, 2016: Doing well. Still has lots of issues with leg swelling . Elevates his  legs at night - legs are much better in the am.  Cath revealed mildly elevated right sided pressures. Has mild - moderate CAD  LV EF is 55-60%  We doubled his lasix to 40 BID for several days and felt much better .    Dec. 5, 2016: Travis [name] is seen today for follow up of his CHF. Seems to be getting stronger. Able to get around with a cane - which is an improvement  Doing some calf exercises which has helped the leg swelling .   July 17, 2016;  Travis [name] is seen today  Runs out of breath before he should  O2 sats here are 88% on room air.   [name] has a pulse-ox - his typical O2 sate is between 92%-94%.  Cath last year showed mild - mod irreg. Echo shows normal LV function with grade 2 diastolic  dysfunction . Not eating any extra salt   Jan. 22, 2018:  Travis [name] is seen today wife his wife.  Walking with a walker  Has had leg swelling which has now resolved.  Trying to avoid salt.     April 14, 2017:  Travis [name] was in the hospital in March for pneumonia  And CHF exacerbation .   He was also found to have a Pseudomonas urinary tract infection He was treated with IV antibiotics and diuretics and made a steady improvement after 3 days.  O2 saturations are 93% today on room air. His weight is 289 pounds.  Working on his diet.   Is eating out less.   No CP  Uses his CPAP,   Has restless legs.   Past Medical History:  Diagnosis Date  . Cervical myelopathy (HCC)    with myelomalacia at C5-6 and C3-4  . CHF (congestive heart failure) ([place])   . Constipation - functional    controlled with miralax  . DJD (degenerative joint disease)    WHOLE SPINE  . Dyspnea   . Gait disorder   . Heart murmur    Evaluated by cardiologist 4-5 years ago but does not currently see a cardiologist  . Hypertension   . Hyperthyroidism    Had Iodine to resolve issue DX 10 YR AGO  . Hypothyroidism    Low d/t treatment of hyperthyroidism  . 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 Peripheral neuropathy   . Pneumonia   . PONV (postoperative nausea and vomiting)   . RBBB   . Renal disorder    acute kidney failure  . Restless legs syndrome 03/14/2015  . RLS (restless legs syndrome)   . Sleep apnea    DX  2009/2010 study done at [facility]  . 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 Type II diabetes mellitus ([place])    diet controlled    Past Surgical History:  Procedure Laterality Date  . BACK SURGERY     6 surgeries  . CARDIAC CATHETERIZATION [name] 09/07/2015   Procedure: Right/Left Heart Cath and Coronary Angiography;  Surgeon: Peter M Martinique, MD;  Location: [place] [facility];  Service: Cardiovascular;  Laterality: [name];  . CHOLECYSTECTOMY    . LUMBAR LAMINECTOMY/DECOMPRESSION MICRODISCECTOMY  07/14/2012    Procedure: LUMBAR LAMINECTOMY/DECOMPRESSION MICRODISCECTOMY 2 LEVELS;  Surgeon: Eustace Moore, MD;  Location: [place] [facility];  Service: Neurosurgery;  Laterality: Left;  Lumbar two three laminectomy, left thoracic four five transpedicular diskectomy     Current Outpatient Prescriptions  Medication Sig Dispense Refill  . aspirin EC 81 MG EC tablet Take 1 tablet (  81 mg total) by mouth daily. 30 tablet 0  . baclofen (LIORESAL) 10 MG tablet Take 1 tablet (10 mg total) by mouth 3 (three) times daily. For muscle spasms 90 each 1  . escitalopram (LEXAPRO) 10 MG tablet Take 10 mg by mouth daily.  2  . gabapentin (NEURONTIN) 400 MG capsule TAKE 1 CAPSULE (400 MG TOTAL) BY MOUTH EVERY 8 (EIGHT) HOURS. 90 capsule 0  . glimepiride (AMARYL) 2 MG tablet Take 2 mg by mouth daily with breakfast.    . hydrALAZINE (APRESOLINE) 25 MG tablet Take 1 tablet (25 mg total) by mouth every 8 (eight) hours. 90 tablet 0  . levothyroxine (SYNTHROID, LEVOTHROID) 175 MCG tablet Take 175 mcg by mouth daily.  2  . losartan (COZAAR) 50 MG tablet TAKE 1 TABLET BY MOUTH ONCE DAILY. 90 tablet 2  . polyethylene glycol (MIRALAX / GLYCOLAX) packet Take 17 g by mouth daily as needed. For constipation (Patient taking differently: Take 17 g by mouth daily as needed for moderate constipation. ) 14 each 0  . potassium chloride (KLOR-CON 10) 10 MEQ tablet Take 1 tablet (10 mEq total) by mouth 2 (two) times daily. 180 tablet 3  . pramipexole (MIRAPEX) 0.5 MG tablet Take 0.5 mg by mouth 2 (two) times daily.     . tamsulosin (FLOMAX) 0.4 MG CAPS capsule Take 0.4 mg by mouth daily.     [name] torsemide (DEMADEX) 20 MG tablet Take 1 tablet (20 mg total) by mouth 2 (two) times daily. Take additional 10 mg as needed for weight gain or shortness of breath. 60 tablet 11  . TOVIAZ 8 MG TB24 tablet Take 8 mg by mouth daily.  11   No current facility-administered medications for this visit.     Allergies:   Other and Levofloxacin in d5w    Social  History:  The patient  reports that he quit smoking about 31 years ago. His smoking use included Cigarettes. He has a 40.00 pack-year smoking history. He quit smokeless tobacco use about 31 years ago. He reports that he does not drink alcohol or use drugs.   Family History:  The patient's family history includes Diabetes in his father; Heart disease in his father; Hypertension in his father; Polycythemia in his mother.    ROS:  Please see the history of present illness.    Review of Systems: Constitutional:  denies fever, chills, diaphoresis, appetite change and fatigue.  HEENT: denies photophobia, eye pain, redness, hearing loss, ear pain, congestion, sore throat, rhinorrhea, sneezing, neck pain, neck stiffness and tinnitus.  Respiratory: admits to SOB, DOE, cough, chest tightness, and wheezing.  Cardiovascular: admits to   leg swelling.  Gastrointestinal: denies nausea, vomiting, abdominal pain, diarrhea, constipation, blood in stool.  Genitourinary: denies dysuria, urgency, frequency, hematuria, flank pain and difficulty urinating.  Musculoskeletal: denies  myalgias, back pain, joint swelling, arthralgias and gait problem.   Skin: denies pallor, rash and wound.  Neurological: denies dizziness, seizures, syncope, weakness, light-headedness, numbness and headaches.   Hematological: denies adenopathy, easy bruising, personal or family bleeding history.  Psychiatric/ Behavioral: denies suicidal ideation, mood changes, confusion, nervousness, sleep disturbance and agitation.       All other systems are reviewed and negative.     PHYSICAL EXAM: VS:  BP (!) 120/50 (BP Location: Right Arm, Patient Position: Sitting, Cuff Size: Large)   Pulse (!) 55   Ht 6\' 1"  (1.854 m)   Wt 289 lb 1.9 oz (131.1 kg)   SpO2 93%  BMI 38.14 kg/m  , BMI Body mass index is 38.14 kg/m.     GEN: Well nourished, well developed, in no acute distress  HEENT: normal  Neck: no JVD, carotid bruits, or  masses Cardiac: RRR; no murmurs, rubs, or gallops, 1+ pitting  edema  Respiratory:  clear to auscultation bilaterally, normal work of breathing GI:   Soft, + BS  MS: no deformity or atrophy  Skin: warm and dry, no rash Neuro:  Strength and sensation are intact.  He was walking with the assistance of a walker.  Gait is very slow  Psych: normal  EKG:     Recent Labs: 02/12/2017: NT-Pro BNP 109 03/26/2017: ALT 22; B Natriuretic Peptide 265.1 03/27/2017: TSH 0.465 03/28/2017: Magnesium 2.3 03/30/2017: Hemoglobin 11.8; Platelets 161 04/01/2017: BUN 51; Creatinine, Ser 1.52; Potassium 3.9; Sodium 139    Lipid Panel    Component Value Date/Time   CHOL 124 08/20/2015 1458   TRIG 82 08/20/2015 1458   HDL 29 (L) 08/20/2015 1458   CHOLHDL 4.3 08/20/2015 1458   VLDL 16 08/20/2015 1458   LDLCALC 79 08/20/2015 1458      Wt Readings from Last 3 Encounters:  04/14/17 289 lb 1.9 oz (131.1 kg)  04/01/17 281 lb (127.5 kg)  02/18/17 299 lb (135.6 kg)      Other studies Reviewed: Additional studies/ records that were reviewed today include: . Review of the above records demonstrates:    ASSESSMENT AND PLAN:  1. Hypertension- BP is doing well controlled   2. Hypothyroidism 3. DM- glucose levels have been well-controlled. 4. OSA 5. Acute on chronic diastolic CHF:   He has improved quite a bit. Was hospitalized with pneumonia with superimposed acute on chronic diastolic CHF  Seems to be better now   6. Generalized weakness:  He's had generalized weakness for years.  I've asked to discuss this with his general medical doctor.  Current medicines are reviewed at length with the patient today.  The patient does not have concerns regarding medicines.    Labs/ tests ordered today include:   BMP   Signed, Mertie Moores, MD  04/14/2017 10:35 AM    [facility] [address], [place], [place]  [address] Phone: [phone]; Fax: [phone]

## 2017-04-15 ENCOUNTER — Telehealth: Payer: Self-pay | Admitting: Cardiovascular Disease

## 2017-04-15 NOTE — Telephone Encounter (Signed)
Reviewed lab results with patient who verbalized understanding and thanked me for call

## 2017-04-15 NOTE — Telephone Encounter (Signed)
Follow Up: ° ° °Returning your call,concerning his lab results. °

## 2017-04-16 ENCOUNTER — Encounter: Payer: Medicare Other | Admitting: Rehabilitation

## 2017-04-24 ENCOUNTER — Ambulatory Visit: Payer: Medicare Other | Admitting: Cardiovascular Disease

## 2017-05-06 ENCOUNTER — Other Ambulatory Visit: Payer: Self-pay | Admitting: Cardiovascular Disease

## 2017-07-14 ENCOUNTER — Encounter: Payer: Self-pay | Admitting: Cardiovascular Disease

## 2017-07-14 ENCOUNTER — Encounter (INDEPENDENT_AMBULATORY_CARE_PROVIDER_SITE_OTHER): Payer: Self-pay

## 2017-07-14 ENCOUNTER — Ambulatory Visit (INDEPENDENT_AMBULATORY_CARE_PROVIDER_SITE_OTHER): Payer: Medicare Other | Admitting: Cardiovascular Disease

## 2017-07-14 VITALS — BP 140/60 | HR 56 | Ht 73.0 in | Wt 292.0 lb

## 2017-07-14 DIAGNOSIS — I5032 Chronic diastolic (congestive) heart failure: Secondary | ICD-10-CM

## 2017-07-14 DIAGNOSIS — I10 Essential (primary) hypertension: Secondary | ICD-10-CM

## 2017-07-14 DIAGNOSIS — I5043 Acute on chronic combined systolic (congestive) and diastolic (congestive) heart failure: Secondary | ICD-10-CM | POA: Diagnosis not present

## 2017-07-14 DIAGNOSIS — I270 Primary pulmonary hypertension: Secondary | ICD-10-CM | POA: Diagnosis not present

## 2017-07-14 LAB — BASIC METABOLIC PANEL
BUN / CREAT RATIO: 25 — AB (ref 10–24)
BUN: 40 mg/dL — AB (ref 8–27)
CO2: 22 mmol/L (ref 20–29)
CREATININE: 1.57 mg/dL — AB (ref 0.76–1.27)
Calcium: 9.5 mg/dL (ref 8.6–10.2)
Chloride: 101 mmol/L (ref 96–106)
GFR, EST AFRICAN AMERICAN: 50 mL/min/{1.73_m2} — AB (ref >59)
GFR, EST NON AFRICAN AMERICAN: 43 mL/min/{1.73_m2} — AB (ref >59)
GLUCOSE: 121 mg/dL — AB (ref 65–99)
Potassium: 5 mmol/L (ref 3.5–5.2)
SODIUM: 145 mmol/L — AB (ref 134–144)

## 2017-07-14 NOTE — Progress Notes (Signed)
Cardiology Office Note   Date:  07/14/2017   ID:  Travis [name], Travis [name] 1946-08-13, MRN [id number]  PCP:  [name], MD  Cardiologist:   Mertie Moores, MD   Chief Complaint  Patient presents with  . Congestive Heart Failure   1. Hypertension 2. Hypothyroidism 3. DM 4. OSA 5. Mild pulmonary hypertension.  6. Chronic diastolic dysfunction   History of Present Illness: Travis [name] is seen back today for a post hospital visit. He is the father - in -law of Travis [name]  ([employer] nurse) and Travis [name] ([place]) I saw him in consultatoin on July 17, 2014 for issues related to his diastolic dysfunction in the setting of pneumonia / respiratory failure He was seen By Travis [name] in late August for follow up of his hospitalization.   [name] He is a 71 year old male with HTN, hypothyroidism, OSA, poorly controlled type 2 DM, former smoker and DJD.  Admitted back in July with CAP/UTI with associated sepsis. Was intubated due to progressive respiratory failure. Had AKI/ATN in the setting of PNA, hypoxic RF and septic shock. Felt to have some degree of diastolic HF - echo was not of good quality. Apparently has had past high sodium diet and uncontrolled HTN. Looks like he was on Lisinopril HCT, cardura and a lower dose of beta blocker. Mildly elevated troponin but no anginal symptoms and this was suspected to be from respiratory failure/CAP with septic shock.   Was seen by Travis [name] on August 28, Reported getting stronger, Breathing was ok. Leg edema has improved.   Sept. 9, 2015:  Travis [name] is seen today with his wife. Walking with a walker. Getting stronger every day.  Has cut out his high salt foods.  He i still working Administrator, [employer])  Has been seen by home health nurses - HR has been slow. No episodes of syncope.   Sept. 24, 2015:  Travis [name] is doing OK but continues to gradually gained strength. His home health nurse recently noted that his heart rate was  very slow. Occasionally have some episodes of very mild lightheadedness when he sat on the toilet type of bowel movement. He has not had episodes of syncope and seems to be doing pretty well at other times. He Has not had any syncope.   Dec. 11, 2015:  Travis [name] is here today for his HTN,  Has OSA - has been wearing his CPAP. , no CP and no dyspnea. Occasional eipisodes of orthostatic hypotension. Making progress with his rehab but he had to suspend it because of his elevated BP Does his best to avoid salt.  We reviewed his diet - no high salt items typically .  BP at the rehab is typically elevated but    March 09, 2015:   Travis [name] is a 71 y.o. male who presents for follow-up of his high blood pressure. He slowly getting stronger but still walks with the assistance of a walker. He has some left leg pain and he has had to re-start using his walker again - was using a cane.  Wears his CPAP at night.  BP yesterday was 970 -263 systolic .   He had some lightheadedness yesterday - glucose was ok.  BP was ok  August 17, 2015:  Doing well. Going to rehab -  BP looks great  No CP or dyspnea   Sept. 27, 2016: Doing well. Still has lots of issues with leg swelling . Elevates his  legs at night - legs are much better in the am.  Cath revealed mildly elevated right sided pressures. Has mild - moderate CAD  LV EF is 55-60%  We doubled his lasix to 40 BID for several days and felt much better .    Dec. 5, 2016: Travis [name] is seen today for follow up of his CHF. Seems to be getting stronger. Able to get around with a cane - which is an improvement  Doing some calf exercises which has helped the leg swelling .   July 17, 2016;  Travis [name] is seen today  Runs out of breath before he should  O2 sats here are 88% on room air.   [name] has a pulse-ox - his typical O2 sate is between 92%-94%.  Cath last year showed mild - mod irreg. Echo shows normal LV function with grade 2 diastolic  dysfunction . Not eating any extra salt   Jan. 22, 2018:  Travis [name] is seen today wife his wife.  Walking with a walker  Has had leg swelling which has now resolved.  Trying to avoid salt.     April 14, 2017:  Travis [name] was in the hospital in March for pneumonia  And CHF exacerbation .   He was also found to have a Pseudomonas urinary tract infection He was treated with IV antibiotics and diuretics and made a steady improvement after 3 days.  O2 saturations are 93% today on room air. His weight is 289 pounds.  Working on his diet.   Is eating out less.   No CP  Uses his CPAP,   Has restless legs.   July 14, 2017:  Travis [name] is seen today for work in visit for worsening CHF Travis [name] had increase in weight gain and leg swelling. Oxygen level was 83%. Normally takes Demadex 20 mg BID  He doubled his Demadex to 40 BID for 3 days and legs have returned to normal .    Breathing is back to baseline Legs are still weak    Past Medical History:  Diagnosis Date  . Cervical myelopathy (HCC)    with myelomalacia at C5-6 and C3-4  . CHF (congestive heart failure) ([place])   . Constipation - functional    controlled with miralax  . DJD (degenerative joint disease)    WHOLE SPINE  . Dyspnea   . Gait disorder   . Heart murmur    Evaluated by cardiologist 4-5 years ago but does not currently see a cardiologist  . Hypertension   . Hyperthyroidism    Had Iodine to resolve issue DX 10 YR AGO  . Hypothyroidism    Low d/t treatment of hyperthyroidism  . 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 Peripheral neuropathy   . Pneumonia   . PONV (postoperative nausea and vomiting)   . RBBB   . Renal disorder    acute kidney failure  . Restless legs syndrome 03/14/2015  . RLS (restless legs syndrome)   . Sleep apnea    DX  2009/2010 study done at [facility]  . 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 Type II diabetes mellitus ([place])    diet controlled    Past Surgical History:  Procedure Laterality Date  .  BACK SURGERY     6 surgeries  . CARDIAC CATHETERIZATION [name] 09/07/2015   Procedure: Right/Left Heart Cath and Coronary Angiography;  Surgeon: Peter M Martinique, MD;  Location: [facility];  Service: Cardiovascular;  Laterality: [name];  . CHOLECYSTECTOMY    .  LUMBAR LAMINECTOMY/DECOMPRESSION MICRODISCECTOMY  07/14/2012   Procedure: LUMBAR LAMINECTOMY/DECOMPRESSION MICRODISCECTOMY 2 LEVELS;  Surgeon: Eustace Moore, MD;  Location: [facility];  Service: Neurosurgery;  Laterality: Left;  Lumbar two three laminectomy, left thoracic four five transpedicular diskectomy     Current Outpatient Prescriptions  Medication Sig Dispense Refill  . aspirin EC 81 MG EC tablet Take 1 tablet (81 mg total) by mouth daily. 30 tablet 0  . baclofen (LIORESAL) 10 MG tablet Take 1 tablet (10 mg total) by mouth 3 (three) times daily. For muscle spasms 90 each 1  . escitalopram (LEXAPRO) 10 MG tablet Take 10 mg by mouth daily.  2  . gabapentin (NEURONTIN) 400 MG capsule TAKE 1 CAPSULE (400 MG TOTAL) BY MOUTH EVERY 8 (EIGHT) HOURS. 90 capsule 0  . glimepiride (AMARYL) 2 MG tablet Take 2 mg by mouth daily with breakfast.    . hydrALAZINE (APRESOLINE) 25 MG tablet Take 1 tablet (25 mg total) by mouth every 8 (eight) hours. 90 tablet 0  . levothyroxine (SYNTHROID, LEVOTHROID) 175 MCG tablet Take 175 mcg by mouth daily.  2  . losartan (COZAAR) 50 MG tablet TAKE 1 TABLET BY MOUTH ONCE DAILY. 90 tablet 3  . polyethylene glycol (MIRALAX / GLYCOLAX) packet Take 17 g by mouth daily as needed. For constipation (Patient taking differently: Take 17 g by mouth daily as needed for moderate constipation. ) 14 each 0  . potassium chloride (KLOR-CON 10) 10 MEQ tablet Take 1 tablet (10 mEq total) by mouth 2 (two) times daily. 180 tablet 3  . pramipexole (MIRAPEX) 0.5 MG tablet Take 0.5 mg by mouth 2 (two) times daily.     . tamsulosin (FLOMAX) 0.4 MG CAPS capsule Take 0.4 mg by mouth daily.     [name] torsemide (DEMADEX) 20 MG tablet Take 1  tablet (20 mg total) by mouth 2 (two) times daily. Take additional 10 mg as needed for weight gain or shortness of breath. 60 tablet 11  . TOVIAZ 8 MG TB24 tablet Take 8 mg by mouth daily.  11   No current facility-administered medications for this visit.     Allergies:   Other and Levofloxacin in d5w    Social History:  The patient  reports that he quit smoking about 32 years ago. His smoking use included Cigarettes. He has a 40.00 pack-year smoking history. He quit smokeless tobacco use about 32 years ago. He reports that he does not drink alcohol or use drugs.   Family History:  The patient's family history includes CAD in his unknown relative; Diabetes in his father; Heart disease in his father; Hypertension in his father; Polycythemia in his mother.    ROS:  Please see the history of present illness.    Review of Systems: Constitutional:  denies fever, chills, diaphoresis, appetite change and fatigue.  HEENT: denies photophobia, eye pain, redness, hearing loss, ear pain, congestion, sore throat, rhinorrhea, sneezing, neck pain, neck stiffness and tinnitus.  Respiratory: admits to SOB, DOE, cough, chest tightness, and wheezing.  Cardiovascular: admits to   leg swelling.  Gastrointestinal: denies nausea, vomiting, abdominal pain, diarrhea, constipation, blood in stool.  Genitourinary: denies dysuria, urgency, frequency, hematuria, flank pain and difficulty urinating.  Musculoskeletal: denies  myalgias, back pain, joint swelling, arthralgias and gait problem.   Skin: denies pallor, rash and wound.  Neurological: denies dizziness, seizures, syncope, weakness, light-headedness, numbness and headaches.   Hematological: denies adenopathy, easy bruising, personal or family bleeding history.  Psychiatric/ Behavioral: denies suicidal ideation,  mood changes, confusion, nervousness, sleep disturbance and agitation.       All other systems are reviewed and negative.     PHYSICAL  EXAM: VS:  BP 140/60   Pulse (!) 56   Ht 6\' 1"  (1.854 m)   Wt 292 lb (132.5 kg)   SpO2 95%   BMI 38.52 kg/m  , BMI Body mass index is 38.52 kg/m.     GEN: Well nourished, well developed, in no acute distress  HEENT: normal  Neck: no JVD, carotid bruits, or masses Cardiac: RRR; no murmurs, rubs, or gallops, 1+ pitting  edema  Respiratory:  clear to auscultation bilaterally, normal work of breathing GI:   Soft, + BS  MS: no deformity or atrophy  Skin: warm and dry, no rash Neuro:  Strength and sensation are intact.  He was walking with the assistance of a walker.  Gait is very slow  Psych: normal  EKG:     Recent Labs: 02/12/2017: NT-Pro BNP 109 03/26/2017: ALT 22; B Natriuretic Peptide 265.1 03/27/2017: TSH 0.465 03/28/2017: Magnesium 2.3 03/30/2017: Hemoglobin 11.8; Platelets 161 04/14/2017: BUN 31; Creatinine, Ser 1.34; Potassium 4.5; Sodium 144    Lipid Panel    Component Value Date/Time   CHOL 124 08/20/2015 1458   TRIG 82 08/20/2015 1458   HDL 29 (L) 08/20/2015 1458   CHOLHDL 4.3 08/20/2015 1458   VLDL 16 08/20/2015 1458   LDLCALC 79 08/20/2015 1458      Wt Readings from Last 3 Encounters:  07/14/17 292 lb (132.5 kg)  04/14/17 289 lb 1.9 oz (131.1 kg)  04/01/17 281 lb (127.5 kg)      Other studies Reviewed: Additional studies/ records that were reviewed today include: . Review of the above records demonstrates:    ASSESSMENT AND PLAN:  1. Hypertension- BP is doing well controlled   2. Hypothyroidism 3. DM- glucose levels have been well-controlled. 4. OSA 5. Acute on chronic diastolic CHF:   He had an acute exacerbation of diastolic CHF last week. He doubled his Demadex to 40 mg twice a day and improved. I've encouraged him to watch his salt intake. I'll see him in 3 months   Current medicines are reviewed at length with the patient today.  The patient does not have concerns regarding medicines.    Labs/ tests ordered today include:   BMP    Signed, Mertie Moores, MD  07/14/2017 8:59 AM    [facility] [place], [place], [place]  [address] Phone: [phone]; Fax: [phone]

## 2017-07-14 NOTE — Patient Instructions (Addendum)
Medication Instructions:  Your physician recommends that you continue on your current medications as directed. Please refer to the Current Medication list given to you today.   Labwork: TODAY - basic metabolic panel   Testing/Procedures: None Ordered   Follow-Up: Your physician recommends that you schedule a follow-up appointment in: 3 months on Friday Oct. 5 at 10:20 am with Dr. Acie Fredrickson   If you need a refill on your cardiac medications before your next appointment, please call your pharmacy.   Thank you for choosing [facility]! Christen Bame, RN [phone]

## 2017-09-01 ENCOUNTER — Encounter: Payer: Self-pay | Admitting: Neurology

## 2017-09-01 ENCOUNTER — Ambulatory Visit (INDEPENDENT_AMBULATORY_CARE_PROVIDER_SITE_OTHER): Payer: Medicare Other | Admitting: Neurology

## 2017-09-01 VITALS — BP 151/69 | HR 57 | Ht 73.0 in | Wt 292.0 lb

## 2017-09-01 DIAGNOSIS — M21371 Foot drop, right foot: Secondary | ICD-10-CM

## 2017-09-01 DIAGNOSIS — G4733 Obstructive sleep apnea (adult) (pediatric): Secondary | ICD-10-CM

## 2017-09-01 DIAGNOSIS — M4804 Spinal stenosis, thoracic region: Secondary | ICD-10-CM | POA: Diagnosis not present

## 2017-09-01 DIAGNOSIS — M4712 Other spondylosis with myelopathy, cervical region: Secondary | ICD-10-CM

## 2017-09-01 DIAGNOSIS — R269 Unspecified abnormalities of gait and mobility: Secondary | ICD-10-CM | POA: Diagnosis not present

## 2017-09-01 DIAGNOSIS — M21372 Foot drop, left foot: Secondary | ICD-10-CM

## 2017-09-01 DIAGNOSIS — Z9989 Dependence on other enabling machines and devices: Secondary | ICD-10-CM

## 2017-09-01 DIAGNOSIS — G63 Polyneuropathy in diseases classified elsewhere: Secondary | ICD-10-CM

## 2017-09-01 NOTE — Patient Instructions (Signed)
   We will get AFO braces for the feet, and get myelogram of the neck and mid back.

## 2017-09-01 NOTE — Progress Notes (Signed)
Reason for visit: Gait disorder  Travis [name] is an 71 y.o. male  History of present illness:  Travis [name] is a 71 year old right-handed white male with a history of a thoracic and cervical myelopathy and a gait disorder associated with this. The patient has been seen previously by Dr. Ronnald Ramp from neurosurgery. The patient believes that his ability to ambulate has worsened over the last 6 months. His last MRI evaluation of the spine was in 2013. The patient is now having episodes of collapsing of the legs, he has fallen on 3 occasions last week. He has difficulty with hip flexion bilaterally and he has bilateral foot drops. He does have some weakness in the hands and he believes that his arms are getting weaker. He has had some increasing urinary incontinence. The patient has neck pain and pain into the interscapular area, this has remained relatively stable. He has undergone physical therapy in February 2018. He returns to the office today because of the change in his ability to ambulate.   Past Medical History:  Diagnosis Date  . Cervical myelopathy (HCC)    with myelomalacia at C5-6 and C3-4  . CHF (congestive heart failure) ([place])   . Constipation - functional    controlled with miralax  . DJD (degenerative joint disease)    WHOLE SPINE  . Dyspnea   . Gait disorder   . Heart murmur    Evaluated by cardiologist 4-5 years ago but does not currently see a cardiologist  . Hypertension   . Hyperthyroidism    Had Iodine to resolve issue DX 10 YR AGO  . Hypothyroidism    Low d/t treatment of hyperthyroidism  . 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 Peripheral neuropathy   . Pneumonia   . PONV (postoperative nausea and vomiting)   . RBBB   . Renal disorder    acute kidney failure  . Restless legs syndrome 03/14/2015  . RLS (restless legs syndrome)   . Sleep apnea    DX  2009/2010 [facility]  . 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 Type II diabetes mellitus ([place])    diet controlled    Past Surgical History:  Procedure Laterality Date  . BACK SURGERY     6 surgeries  . CARDIAC CATHETERIZATION [name] 09/07/2015   Procedure: Right/Left Heart Cath and Coronary Angiography;  Surgeon: Peter M Martinique, MD;  Location: [facility];  Service: Cardiovascular;  Laterality: [name];  . CHOLECYSTECTOMY    . LUMBAR LAMINECTOMY/DECOMPRESSION MICRODISCECTOMY  07/14/2012   Procedure: LUMBAR LAMINECTOMY/DECOMPRESSION MICRODISCECTOMY 2 LEVELS;  Surgeon: Eustace Moore, MD;  Location: [facility];  Service: Neurosurgery;  Laterality: Left;  Lumbar two three laminectomy, left thoracic four five transpedicular diskectomy    Family History  Problem Relation Age of Onset  . Polycythemia Mother   . Diabetes Father   . Hypertension Father   . Heart disease Father   . CAD Unknown        1 of his 4 brothers and father has CAD    Social history:  reports that he quit smoking about 32 years ago. His smoking use included Cigarettes. He has a 40.00 pack-year smoking history. He quit smokeless tobacco use about 32 years ago. He reports that he does not drink alcohol or use drugs.    Allergies  Allergen Reactions  . Other Other (See Comments)    Anesthesia makes sick, must have antinausea medication in advance.  [name]  Levofloxacin In D5w Itching    Possible infusion reaction 03/29/17. Tolerating oral Levaquin 03/30/17    Medications:  Prior to Admission medications   Medication Sig Start Date End Date Taking? Authorizing Provider  aspirin EC 81 MG EC tablet Take 1 tablet (81 mg total) by mouth daily. 08/23/15  Yes Thurnell Lose, MD  baclofen (LIORESAL) 10 MG tablet Take 1 tablet (10 mg total) by mouth 3 (three) times daily. For muscle spasms 10/30/14  Yes Meredith Staggers, MD  escitalopram (LEXAPRO) 10 MG tablet Take 10 mg by mouth daily. 07/12/15  Yes [provider]  gabapentin (NEURONTIN) 400 MG capsule TAKE 1 CAPSULE (400 MG TOTAL) BY MOUTH EVERY 8 (EIGHT) HOURS.  04/16/15  Yes Meredith Staggers, MD  glimepiride (AMARYL) 2 MG tablet Take 2 mg by mouth daily with breakfast.   Yes [provider]  hydrALAZINE (APRESOLINE) 25 MG tablet Take 1 tablet (25 mg total) by mouth every 8 (eight) hours. 08/23/15  Yes Thurnell Lose, MD  levothyroxine (SYNTHROID, LEVOTHROID) 175 MCG tablet Take 175 mcg by mouth daily. 08/12/15  Yes [provider]  losartan (COZAAR) 50 MG tablet TAKE 1 TABLET BY MOUTH ONCE DAILY. 05/06/17  Yes Nahser, Wonda Cheng, MD  polyethylene glycol (MIRALAX / GLYCOLAX) packet Take 17 g by mouth daily as needed. For constipation Patient taking differently: Take 17 g by mouth daily as needed for moderate constipation.  08/09/14  Yes Love, Ivan Anchors, [name]  potassium chloride (KLOR-CON 10) 10 MEQ tablet Take 1 tablet (10 mEq total) by mouth 2 (two) times daily. 09/25/15  Yes Nahser, Wonda Cheng, MD  pramipexole (MIRAPEX) 0.5 MG tablet Take 0.5 mg by mouth 2 (two) times daily.  08/09/14  Yes Love, Ivan Anchors, [name]  tamsulosin (FLOMAX) 0.4 MG CAPS capsule Take 0.4 mg by mouth daily.  09/13/14  Yes [provider]  torsemide (DEMADEX) 20 MG tablet Take 1 tablet (20 mg total) by mouth 2 (two) times daily. Take additional 10 mg as needed for weight gain or shortness of breath. 02/18/17  Yes Bhagat, Bhavinkumar, PA  TOVIAZ 8 MG TB24 tablet Take 8 mg by mouth daily. 08/07/15  Yes [provider]    ROS:  Out of a complete 14 system review of symptoms, the patient complains only of the following symptoms, and all other reviewed systems are negative.  Decreased activity Runny nose Eye discharge, double vision, blurred vision Shortness of breath Snoring Incontinence of the bladder Back pain Skin wounds Memory loss, weakness  Blood pressure (!) 151/69, pulse (!) 57, height 6\' 1"  (1.854 m), weight 292 lb (132.5 kg).  Physical Exam  General: The patient is alert and cooperative at the time of the examination.  Skin: 3+ edema below  the knees is noted bilaterally..   Neurologic Exam  Mental status: The patient is alert and oriented x 3 at the time of the examination. The patient has apparent normal recent and remote memory, with an apparently normal attention span and concentration ability.   Cranial nerves: Facial symmetry is present. Speech is normal, no aphasia or dysarthria is noted. Extraocular movements are full. Visual fields are full.  Motor: The patient has good strength in the upper extremities with exception that there is some weakness of intrinsic muscles of the hands bilaterally, weakness with external rotation of the right arm and weakness with supination of both arms. With the legs, there is weakness with hip flexion that is 3/5 bilaterally, bilateral foot drops are  noted. Good strength with flexion and extension of the knees is noted bilaterally.  Sensory examination: Soft touch sensation is symmetric on the face, arms, and legs.  Coordination: The patient has good finger-nose-finger bilaterally, but the patient has difficulty performing heel-to-shin bilaterally.  Gait and station: The patient some assistance with standing and with turning to use the walker. The patient is able to walk slowly with a walker, he drags the feet with taking each step.  Reflexes: Deep tendon reflexes are symmetric.   Assessment/Plan:  1. Cervical and thoracic myelopathy  2. Progressive gait disorder  3. Bilateral foot drops  4. Sleep apnea on CPAP  The patient has had gradual worsening of his ability to ambulate. He will be given a prescription for bilateral AFO braces. We will do a myelogram and CT to follow of the thoracic and cervical spine, last evaluation was in 2013. The patient has sleep apnea, we will refer him to a sleep physician. He reports worsening daytime sleepiness, he believes that his mask is not fitting properly.  Jill Alexanders MD 09/01/2017 11:15 AM  [facility] [address] [place], [place] [address]  Phone [phone] Fax [phone]

## 2017-09-02 ENCOUNTER — Encounter: Payer: Self-pay | Admitting: Cardiovascular Disease

## 2017-09-11 ENCOUNTER — Other Ambulatory Visit: Payer: Self-pay | Admitting: Neurology

## 2017-09-11 ENCOUNTER — Ambulatory Visit
Admission: RE | Admit: 2017-09-11 | Discharge: 2017-09-11 | Disposition: A | Discharge status: Home / Self Care | Payer: Medicare Other | Source: Ambulatory Visit | Attending: Neurology | Admitting: Neurology

## 2017-09-11 ENCOUNTER — Ambulatory Visit
Admission: RE | Admit: 2017-09-11 | Discharge: 2017-09-11 | Disposition: A | Discharge status: Home / Self Care | Payer: Self-pay | Source: Ambulatory Visit | Attending: Neurology | Admitting: Neurology

## 2017-09-11 ENCOUNTER — Telehealth: Payer: Self-pay | Admitting: Neurology

## 2017-09-11 VITALS — BP 147/91 | HR 65

## 2017-09-11 DIAGNOSIS — R269 Unspecified abnormalities of gait and mobility: Secondary | ICD-10-CM

## 2017-09-11 DIAGNOSIS — M4714 Other spondylosis with myelopathy, thoracic region: Secondary | ICD-10-CM

## 2017-09-11 DIAGNOSIS — M6281 Muscle weakness (generalized): Secondary | ICD-10-CM

## 2017-09-11 DIAGNOSIS — R52 Pain, unspecified: Secondary | ICD-10-CM

## 2017-09-11 DIAGNOSIS — M4712 Other spondylosis with myelopathy, cervical region: Secondary | ICD-10-CM

## 2017-09-11 DIAGNOSIS — M4804 Spinal stenosis, thoracic region: Secondary | ICD-10-CM

## 2017-09-11 MED ORDER — IOPAMIDOL (ISOVUE-M 300) INJECTION 61%
10.0000 mL | Freq: Once | INTRAMUSCULAR | Status: AC | PRN
  Administered 2017-09-11: 10 mL via INTRATHECAL

## 2017-09-11 MED ORDER — DIAZEPAM 5 MG PO TABS
5.0000 mg | ORAL_TABLET | Freq: Once | ORAL | Status: AC
  Administered 2017-09-11: 5 mg via ORAL

## 2017-09-11 NOTE — Progress Notes (Signed)
Patient states he has been off Lexapro for at least the past two days.  Brita Romp, RN

## 2017-09-11 NOTE — Telephone Encounter (Signed)
I called the patient. The myelogram did not show any definite areas of cord compression, the patient indicates that he is getting weaker on all 4 extremities.  Would consider EMG and NCV study. If the patient is amenable to this, he is to call.  Cervical and thoracic myelogram and CT 09/11/17:  IMPRESSION: 1. Solid osseous fusion C2-T1. 2. The most significant cervical central canal stenosis is at C6-7 where ventral calcifications at the disc level effaces the ventral CSF and contacts the ventral surface of the cord. 3. Mild left foraminal narrowing at C2-3. 4. Mild osseous foraminal narrowing bilaterally at C3-4. 5. Moderate osseous foraminal narrowing at C4-5 is worse on the right. 6. Mild foraminal narrowing bilaterally at C5-6. 7. Moderate left and mild right foraminal narrowing at C6-7. 8. Moderate right central canal stenosis with severe left and moderate right foraminal narrowing at T1-2. 9. Mild central canal narrowing with severe foraminal narrowing bilaterally at T2-3. 10. Mild foraminal narrowing bilaterally at T3-4. 11. Moderate left foraminal narrowing at C4-5. 12. Mild osseous foraminal narrowing bilaterally at C5-6. 13. Fusion at T7-8 and T8-9. 14. Posterior pedicle screw and rod fixation at T9-10 without definite fusion. 15. Shallow right paramedian disc protrusion and mild central canal narrowing at T6-7. 16. Moderate residual central canal stenosis at T7-8 secondary to a posterior inferior osteophyte. 17. Central calcified disc at T8-9 with moderate central canal stenosis. 18. Moderate right central and foraminal narrowing at T9-10. 19. Mild foraminal narrowing bilaterally at T10-11.

## 2017-09-11 NOTE — Discharge Instructions (Signed)
Myelogram Discharge Instructions  1. Go home and rest quietly for the next 24 hours.  It is important to lie flat for the next 24 hours.  Get up only to go to the restroom.  You may lie in the bed or on a couch on your back, your stomach, your left side or your right side.  You may have one pillow under your head.  You may have pillows between your knees while you are on your side or under your knees while you are on your back.  2. DO NOT drive today.  Recline the seat as far back as it will go, while still wearing your seat belt, on the way home.  3. You may get up to go to the bathroom as needed.  You may sit up for 10 minutes to eat.  You may resume your normal diet and medications unless otherwise indicated.  Drink lots of extra fluids today and tomorrow.  4. The incidence of headache, nausea, or vomiting is about 5% (one in 20 patients).  If you develop a headache, lie flat and drink plenty of fluids until the headache goes away.  Caffeinated beverages may be helpful.  If you develop severe nausea and vomiting or a headache that does not go away with flat bed rest, call [phone].  5. You may resume normal activities after your 24 hours of bed rest is over; however, do not exert yourself strongly or do any heavy lifting tomorrow. If when you get up you have a headache when standing, go back to bed and force fluids for another 24 hours.  6. Call your physician for a follow-up appointment.  The results of your myelogram will be sent directly to your physician by the following day.  7. If you have any questions or if complications develop after you arrive home, please call [phone].  Discharge instructions have been explained to the patient.  The patient, or the person responsible for the patient, fully understands these instructions.       May resume Lexapro on Sept. 15, 2018, after 10:30 am.

## 2017-09-14 NOTE — Telephone Encounter (Signed)
I will order EMG and nerve conduction study evaluation.  I will do nerve conduction studies on one arm and both legs, EMG on one arm and one leg.

## 2017-09-14 NOTE — Telephone Encounter (Signed)
Patient wife Travis [name] called office in reference to patient wanting to move forward with scheduling NCV/EMG study.  Need orders placed please.

## 2017-09-14 NOTE — Addendum Note (Signed)
Addended by: Kathrynn Ducking on: 09/14/2017 11:05 AM   Modules accepted: Orders

## 2017-09-23 ENCOUNTER — Ambulatory Visit (INDEPENDENT_AMBULATORY_CARE_PROVIDER_SITE_OTHER): Payer: Self-pay | Admitting: Neurology

## 2017-09-23 ENCOUNTER — Encounter: Payer: Self-pay | Admitting: Cardiovascular Disease

## 2017-09-23 ENCOUNTER — Ambulatory Visit (INDEPENDENT_AMBULATORY_CARE_PROVIDER_SITE_OTHER): Payer: Medicare Other | Admitting: Neurology

## 2017-09-23 DIAGNOSIS — M6281 Muscle weakness (generalized): Secondary | ICD-10-CM | POA: Diagnosis not present

## 2017-09-23 DIAGNOSIS — M5136 Other intervertebral disc degeneration, lumbar region: Secondary | ICD-10-CM

## 2017-09-23 DIAGNOSIS — E538 Deficiency of other specified B group vitamins: Secondary | ICD-10-CM

## 2017-09-23 DIAGNOSIS — G63 Polyneuropathy in diseases classified elsewhere: Secondary | ICD-10-CM

## 2017-09-23 NOTE — Progress Notes (Addendum)
This patient comes in for EMG and nerve conduction study today. 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 EMG of the left leg shows chronic predominantly distal denervation consistent with peripheral neuropathy. EMG of the left arm reveals findings consistent with a left ulnar neuropathy and evidence of a chronic overlying C5, C6, and C7 radiculopathy.  The patient continues to progress with his walking, this may be multifactorial in nature related to a prior myelopathy as well as to a peripheral neuropathy.  Prior myelogram of the thoracic and cervical area did not show evidence of cord compression.  We will check a CT scan of the lumbar spine, the patient has had L2-3 spinal stenosis in the past.  We will check blood work today looking for various etiologies of the peripheral neuropathy, the patient does have diabetes.

## 2017-09-23 NOTE — Progress Notes (Signed)
Please refer EMG nerve conduction study procedure note.

## 2017-09-23 NOTE — Procedures (Signed)
HISTORY:  Travis [name] is a 71 year old gentleman with a history of a cervical and thoracic myelopathy with a chronic gait disorder that has significantly progressed over the last 6 months. He does have a history of diabetes as well. He has had cervical, thoracic, and lumbosacral spine surgery previously.  NERVE CONDUCTION STUDIES:  Nerve conduction studies were performed on the left upper extremity. No response was seen for the left ulnar nerve. The distal motor latency for the left median nerve was prolonged with a normal motor amplitude. The nerve conduction velocity for the left median nerve was normal. The sensory latencies on the left upper extremity revealed a prolongation for the left median nerve and no response for the left ulnar nerve. The F wave latencies for the left median and ulnar nerves were unobtainable.  Nerve conduction studies were performed on both lower extremities. No response was seen for the peroneal or posterior tibial nerves on either side. The peroneal sensory latencies were unobtainable bilaterally.  EMG STUDIES:  EMG study was performed on the left upper extremity:  The first dorsal interosseous muscle reveals 2 to 5 K units with decreased recruitment. No fibrillations or positive waves were noted. The abductor pollicis brevis muscle reveals 2 to 4 K units with full recruitment. No fibrillations or positive waves were noted. The extensor indicis proprius muscle reveals 1 to 3 K units with full recruitment. No fibrillations or positive waves were noted. The biceps muscle reveals 2 to 4 K units with decreased recruitment. No fibrillations or positive waves were noted. The triceps muscle reveals 2 to 7 K units with decreased recruitment. No fibrillations or positive waves were noted. The anterior deltoid muscle reveals 2 to 6 K units with decreased recruitment. No fibrillations or positive waves were noted. The cervical paraspinal muscles were tested at 2  levels. No abnormalities of insertional activity were seen at either level tested. There was good relaxation.  EMG study was performed on the left lower extremity:  The tibialis anterior muscle reveals 2 to 5K motor units with decreased recruitment. 2+ fibrillations and positive waves were seen. The peroneus tertius muscle reveals 2 to 7K motor units with markedly reduced recruitment. No fibrillations or positive waves were seen. The medial gastrocnemius muscle reveals 2 to 6K motor units with decreased recruitment. No fibrillations or positive waves were seen. The vastus lateralis muscle reveals 2 to 4K motor units with slightly decreased recruitment. No fibrillations or positive waves were seen. The iliopsoas muscle reveals 2 to 4K motor units with full recruitment. No fibrillations or positive waves were seen. The biceps femoris muscle (long head) reveals 2 to 5K motor units with decreased recruitment. No fibrillations or positive waves were seen. The lumbosacral paraspinal muscles were tested at 3 levels, and revealed no abnormalities of insertional activity at all 3 levels tested. There was fair relaxation.   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 EMG evaluation of the left lower extremity shows primarily chronic distal denervation consistent with the diagnosis of peripheral neuropathy. EMG evaluation of the left upper extend the shows findings consistent with the left ulnar neuropathy and evidence of a chronic stable C5, C6, and C7 radiculopathy.  Jill Alexanders MD 09/23/2017 4:00 PM  [facility] [address] [place], [place] [address]  Phone [phone] Fax  [phone]  

## 2017-09-29 ENCOUNTER — Telehealth: Payer: Self-pay | Admitting: Neurology

## 2017-09-29 DIAGNOSIS — R269 Unspecified abnormalities of gait and mobility: Secondary | ICD-10-CM

## 2017-09-29 LAB — MULTIPLE MYELOMA PANEL, SERUM
ALBUMIN SERPL ELPH-MCNC: 3.6 g/dL (ref 2.9–4.4)
ALBUMIN/GLOB SERPL: 1.1 (ref 0.7–1.7)
ALPHA 1: 0.3 g/dL (ref 0.0–0.4)
ALPHA2 GLOB SERPL ELPH-MCNC: 0.9 g/dL (ref 0.4–1.0)
B-GLOBULIN SERPL ELPH-MCNC: 1.1 g/dL (ref 0.7–1.3)
GAMMA GLOB SERPL ELPH-MCNC: 1.4 g/dL (ref 0.4–1.8)
GLOBULIN, TOTAL: 3.6 g/dL (ref 2.2–3.9)
IGG (IMMUNOGLOBIN G), SERUM: 1244 mg/dL (ref 700–1600)
IgA/Immunoglobulin A, Serum: 280 mg/dL (ref 61–437)
IgM (Immunoglobulin M), Srm: 106 mg/dL (ref 15–143)
Total Protein: 7.2 g/dL (ref 6.0–8.5)

## 2017-09-29 LAB — B. BURGDORFI ANTIBODIES

## 2017-09-29 LAB — ANA W/REFLEX: ANA: NEGATIVE

## 2017-09-29 LAB — VITAMIN B12: VITAMIN B 12: 698 pg/mL (ref 232–1245)

## 2017-09-29 LAB — SEDIMENTATION RATE: SED RATE: 35 mm/h — AB (ref 0–30)

## 2017-09-29 LAB — ANGIOTENSIN CONVERTING ENZYME: Angio Convert Enzyme: 32 U/L (ref 14–82)

## 2017-09-29 NOTE — Addendum Note (Signed)
Addended by: Kathrynn Ducking on: 09/29/2017 03:10 PM   Modules accepted: Orders

## 2017-09-29 NOTE — Telephone Encounter (Signed)
Patients wife called office requesting a referral be placed for [facility] please to be evaluated for a powered wheel chair.  FYI

## 2017-09-29 NOTE — Telephone Encounter (Signed)
Patient will be referred for physical therapy for gait training but also for an assessment for a possible power wheelchair. The patient is gradually losing his ability to ambulate.

## 2017-09-30 ENCOUNTER — Institutional Professional Consult (permissible substitution): Payer: Medicare Other | Admitting: Neurology

## 2017-10-01 ENCOUNTER — Ambulatory Visit
Admission: RE | Admit: 2017-10-01 | Discharge: 2017-10-01 | Disposition: A | Discharge status: Home / Self Care | Payer: Medicare Other | Source: Ambulatory Visit | Attending: Neurology | Admitting: Neurology

## 2017-10-01 ENCOUNTER — Telehealth: Payer: Self-pay | Admitting: Neurology

## 2017-10-01 DIAGNOSIS — M6281 Muscle weakness (generalized): Secondary | ICD-10-CM

## 2017-10-01 DIAGNOSIS — M48062 Spinal stenosis, lumbar region with neurogenic claudication: Secondary | ICD-10-CM

## 2017-10-01 DIAGNOSIS — M5136 Other intervertebral disc degeneration, lumbar region: Secondary | ICD-10-CM

## 2017-10-01 NOTE — Telephone Encounter (Signed)
I called patient. The patient appears to have fairly severe spinal stenosis at the L4-5 level, this could result in some changes in walking and bowel bladder function.  The patient be referred to Dr. Ronnald Ramp to review the study and determine whether further surgical consideration should be made.   CT lumbar 10/01/17:  IMPRESSION: Posterior decompression at L2-3, L3-4 and L4-5. Retrolisthesis at all those levels. Lateral recess and foraminal narrowing at L2-3 and L3-4 that could cause neural compression. Likely severe multifactorial spinal stenosis at and just below the L4-5 level. Lateral recess and foraminal stenosis as well. Neural compression seems likely at this level.  L5-S1 endplate osteophytes and disc protrusion without apparent compressive stenosis. Mild foraminal narrowing.

## 2017-10-02 ENCOUNTER — Inpatient Hospital Stay (HOSPITAL_COMMUNITY): Payer: Medicare Other

## 2017-10-02 ENCOUNTER — Encounter (HOSPITAL_COMMUNITY): Payer: Self-pay

## 2017-10-02 ENCOUNTER — Emergency Department (HOSPITAL_COMMUNITY): Payer: Medicare Other

## 2017-10-02 ENCOUNTER — Inpatient Hospital Stay (HOSPITAL_COMMUNITY)
Admission: EM | Admit: 2017-10-02 | Discharge: 2017-10-08 | DRG: 291 | Disposition: A | Discharge status: Rehabilitation Facility | Payer: Medicare Other | Attending: Internal Medicine | Admitting: Internal Medicine

## 2017-10-02 ENCOUNTER — Ambulatory Visit: Payer: Medicare Other | Admitting: Cardiovascular Disease

## 2017-10-02 DIAGNOSIS — E669 Obesity, unspecified: Secondary | ICD-10-CM | POA: Diagnosis not present

## 2017-10-02 DIAGNOSIS — M48061 Spinal stenosis, lumbar region without neurogenic claudication: Secondary | ICD-10-CM | POA: Diagnosis present

## 2017-10-02 DIAGNOSIS — Z833 Family history of diabetes mellitus: Secondary | ICD-10-CM | POA: Diagnosis not present

## 2017-10-02 DIAGNOSIS — D62 Acute posthemorrhagic anemia: Secondary | ICD-10-CM | POA: Diagnosis not present

## 2017-10-02 DIAGNOSIS — I10 Essential (primary) hypertension: Secondary | ICD-10-CM | POA: Diagnosis not present

## 2017-10-02 DIAGNOSIS — Z7982 Long term (current) use of aspirin: Secondary | ICD-10-CM | POA: Diagnosis not present

## 2017-10-02 DIAGNOSIS — K5901 Slow transit constipation: Secondary | ICD-10-CM | POA: Diagnosis not present

## 2017-10-02 DIAGNOSIS — Z87440 Personal history of urinary (tract) infections: Secondary | ICD-10-CM

## 2017-10-02 DIAGNOSIS — Z794 Long term (current) use of insulin: Secondary | ICD-10-CM

## 2017-10-02 DIAGNOSIS — B9689 Other specified bacterial agents as the cause of diseases classified elsewhere: Secondary | ICD-10-CM | POA: Diagnosis present

## 2017-10-02 DIAGNOSIS — E039 Hypothyroidism, unspecified: Secondary | ICD-10-CM | POA: Diagnosis present

## 2017-10-02 DIAGNOSIS — J9601 Acute respiratory failure with hypoxia: Secondary | ICD-10-CM | POA: Diagnosis present

## 2017-10-02 DIAGNOSIS — N179 Acute kidney failure, unspecified: Secondary | ICD-10-CM | POA: Diagnosis present

## 2017-10-02 DIAGNOSIS — I5033 Acute on chronic diastolic (congestive) heart failure: Secondary | ICD-10-CM | POA: Diagnosis present

## 2017-10-02 DIAGNOSIS — G8250 Quadriplegia, unspecified: Secondary | ICD-10-CM | POA: Diagnosis not present

## 2017-10-02 DIAGNOSIS — Z8249 Family history of ischemic heart disease and other diseases of the circulatory system: Secondary | ICD-10-CM

## 2017-10-02 DIAGNOSIS — R0609 Other forms of dyspnea: Secondary | ICD-10-CM

## 2017-10-02 DIAGNOSIS — R0602 Shortness of breath: Secondary | ICD-10-CM

## 2017-10-02 DIAGNOSIS — I5031 Acute diastolic (congestive) heart failure: Secondary | ICD-10-CM | POA: Diagnosis not present

## 2017-10-02 DIAGNOSIS — I2510 Atherosclerotic heart disease of native coronary artery without angina pectoris: Secondary | ICD-10-CM | POA: Diagnosis present

## 2017-10-02 DIAGNOSIS — E1122 Type 2 diabetes mellitus with diabetic chronic kidney disease: Secondary | ICD-10-CM | POA: Diagnosis present

## 2017-10-02 DIAGNOSIS — Z79899 Other long term (current) drug therapy: Secondary | ICD-10-CM | POA: Diagnosis not present

## 2017-10-02 DIAGNOSIS — E119 Type 2 diabetes mellitus without complications: Secondary | ICD-10-CM

## 2017-10-02 DIAGNOSIS — N183 Chronic kidney disease, stage 3 unspecified: Secondary | ICD-10-CM | POA: Diagnosis present

## 2017-10-02 DIAGNOSIS — N390 Urinary tract infection, site not specified: Secondary | ICD-10-CM

## 2017-10-02 DIAGNOSIS — J9811 Atelectasis: Secondary | ICD-10-CM | POA: Diagnosis present

## 2017-10-02 DIAGNOSIS — E1169 Type 2 diabetes mellitus with other specified complication: Secondary | ICD-10-CM | POA: Diagnosis not present

## 2017-10-02 DIAGNOSIS — M4712 Other spondylosis with myelopathy, cervical region: Secondary | ICD-10-CM | POA: Diagnosis present

## 2017-10-02 DIAGNOSIS — J449 Chronic obstructive pulmonary disease, unspecified: Secondary | ICD-10-CM

## 2017-10-02 DIAGNOSIS — I130 Hypertensive heart and chronic kidney disease with heart failure and stage 1 through stage 4 chronic kidney disease, or unspecified chronic kidney disease: Principal | ICD-10-CM | POA: Diagnosis present

## 2017-10-02 DIAGNOSIS — I700 Atherosclerosis of aorta: Secondary | ICD-10-CM | POA: Diagnosis present

## 2017-10-02 DIAGNOSIS — Z7984 Long term (current) use of oral hypoglycemic drugs: Secondary | ICD-10-CM | POA: Diagnosis not present

## 2017-10-02 DIAGNOSIS — Z8679 Personal history of other diseases of the circulatory system: Secondary | ICD-10-CM | POA: Diagnosis not present

## 2017-10-02 DIAGNOSIS — R509 Fever, unspecified: Secondary | ICD-10-CM | POA: Diagnosis present

## 2017-10-02 DIAGNOSIS — Z23 Encounter for immunization: Secondary | ICD-10-CM | POA: Diagnosis present

## 2017-10-02 DIAGNOSIS — M4807 Spinal stenosis, lumbosacral region: Secondary | ICD-10-CM | POA: Diagnosis not present

## 2017-10-02 DIAGNOSIS — Z87891 Personal history of nicotine dependence: Secondary | ICD-10-CM

## 2017-10-02 DIAGNOSIS — I5042 Chronic combined systolic (congestive) and diastolic (congestive) heart failure: Secondary | ICD-10-CM | POA: Diagnosis not present

## 2017-10-02 DIAGNOSIS — G6289 Other specified polyneuropathies: Secondary | ICD-10-CM | POA: Diagnosis present

## 2017-10-02 DIAGNOSIS — R5381 Other malaise: Secondary | ICD-10-CM | POA: Diagnosis not present

## 2017-10-02 DIAGNOSIS — G4733 Obstructive sleep apnea (adult) (pediatric): Secondary | ICD-10-CM | POA: Diagnosis present

## 2017-10-02 DIAGNOSIS — G63 Polyneuropathy in diseases classified elsewhere: Secondary | ICD-10-CM | POA: Diagnosis not present

## 2017-10-02 LAB — COMPREHENSIVE METABOLIC PANEL
ALT: 19 U/L (ref 17–63)
ANION GAP: 6 (ref 5–15)
AST: 25 U/L (ref 15–41)
Albumin: 3.7 g/dL (ref 3.5–5.0)
Alkaline Phosphatase: 75 U/L (ref 38–126)
BILIRUBIN TOTAL: 1 mg/dL (ref 0.3–1.2)
BUN: 34 mg/dL — AB (ref 6–20)
CO2: 29 mmol/L (ref 22–32)
Calcium: 8.8 mg/dL — ABNORMAL LOW (ref 8.9–10.3)
Chloride: 104 mmol/L (ref 101–111)
Creatinine, Ser: 1.89 mg/dL — ABNORMAL HIGH (ref 0.61–1.24)
GFR calc Af Amer: 39 mL/min — ABNORMAL LOW (ref >60)
GFR, EST NON AFRICAN AMERICAN: 34 mL/min — AB (ref >60)
Glucose, Bld: 200 mg/dL — ABNORMAL HIGH (ref 65–99)
POTASSIUM: 4.1 mmol/L (ref 3.5–5.1)
Sodium: 139 mmol/L (ref 135–145)
TOTAL PROTEIN: 6.9 g/dL (ref 6.5–8.1)

## 2017-10-02 LAB — CBC WITH DIFFERENTIAL/PLATELET
BASOS ABS: 0 10*3/uL (ref 0.0–0.1)
Basophils Relative: 0 %
EOS ABS: 0 10*3/uL (ref 0.0–0.7)
EOS PCT: 0 %
HCT: 38.3 % — ABNORMAL LOW (ref 39.0–52.0)
Hemoglobin: 12.2 g/dL — ABNORMAL LOW (ref 13.0–17.0)
LYMPHS PCT: 3 %
Lymphs Abs: 0.5 10*3/uL — ABNORMAL LOW (ref 0.7–4.0)
MCH: 30 pg (ref 26.0–34.0)
MCHC: 31.9 g/dL (ref 30.0–36.0)
MCV: 94.1 fL (ref 78.0–100.0)
Monocytes Absolute: 1.1 10*3/uL — ABNORMAL HIGH (ref 0.1–1.0)
Monocytes Relative: 8 %
Neutro Abs: 13 10*3/uL — ABNORMAL HIGH (ref 1.7–7.7)
Neutrophils Relative %: 89 %
PLATELETS: 144 10*3/uL — AB (ref 150–400)
RBC: 4.07 MIL/uL — AB (ref 4.22–5.81)
RDW: 16.1 % — ABNORMAL HIGH (ref 11.5–15.5)
WBC: 14.6 10*3/uL — AB (ref 4.0–10.5)

## 2017-10-02 LAB — URINALYSIS, ROUTINE W REFLEX MICROSCOPIC
BILIRUBIN URINE: NEGATIVE
GLUCOSE, UA: NEGATIVE mg/dL
Ketones, ur: NEGATIVE mg/dL
NITRITE: POSITIVE — AB
PH: 5 (ref 5.0–8.0)
Protein, ur: NEGATIVE mg/dL
SPECIFIC GRAVITY, URINE: 1.015 (ref 1.005–1.030)

## 2017-10-02 LAB — INFLUENZA PANEL BY PCR (TYPE A & B)
INFLAPCR: NEGATIVE
Influenza B By PCR: NEGATIVE

## 2017-10-02 LAB — I-STAT CG4 LACTIC ACID, ED
LACTIC ACID, VENOUS: 0.81 mmol/L (ref 0.5–1.9)
Lactic Acid, Venous: 1.1 mmol/L (ref 0.5–1.9)

## 2017-10-02 LAB — TSH: TSH: 0.446 u[IU]/mL (ref 0.350–4.500)

## 2017-10-02 LAB — BRAIN NATRIURETIC PEPTIDE: B Natriuretic Peptide: 169.8 pg/mL — ABNORMAL HIGH (ref 0.0–100.0)

## 2017-10-02 LAB — GLUCOSE, CAPILLARY: GLUCOSE-CAPILLARY: 232 mg/dL — AB (ref 65–99)

## 2017-10-02 LAB — D-DIMER, QUANTITATIVE: D-Dimer, Quant: 0.82 ug/mL-FEU — ABNORMAL HIGH (ref 0.00–0.50)

## 2017-10-02 MED ORDER — FUROSEMIDE 10 MG/ML IJ SOLN
40.0000 mg | Freq: Once | INTRAMUSCULAR | Status: AC
Start: 2017-10-02 — End: 2017-10-02
  Administered 2017-10-02: 40 mg via INTRAVENOUS
  Filled 2017-10-02: qty 4

## 2017-10-02 MED ORDER — ASPIRIN EC 81 MG PO TBEC
81.0000 mg | DELAYED_RELEASE_TABLET | Freq: Every day | ORAL | Status: DC
  Administered 2017-10-03 – 2017-10-08 (×6): 81 mg via ORAL
  Filled 2017-10-02 (×6): qty 1

## 2017-10-02 MED ORDER — ENOXAPARIN SODIUM 30 MG/0.3ML ~~LOC~~ SOLN: 30.0000 mg | SUBCUTANEOUS | Status: DC

## 2017-10-02 MED ORDER — GABAPENTIN 400 MG PO CAPS
400.0000 mg | ORAL_CAPSULE | Freq: Three times a day (TID) | ORAL | Status: DC
  Administered 2017-10-03 – 2017-10-08 (×16): 400 mg via ORAL
  Filled 2017-10-02 (×16): qty 1

## 2017-10-02 MED ORDER — AZITHROMYCIN 250 MG PO TABS: 500.0000 mg | ORAL_TABLET | Freq: Every day | ORAL | Status: AC

## 2017-10-02 MED ORDER — ONDANSETRON HCL 4 MG/2ML IJ SOLN
4.0000 mg | Freq: Once | INTRAMUSCULAR | Status: AC
  Administered 2017-10-02: 4 mg via INTRAVENOUS
  Filled 2017-10-02: qty 2

## 2017-10-02 MED ORDER — FESOTERODINE FUMARATE ER 8 MG PO TB24
8.0000 mg | ORAL_TABLET | Freq: Every day | ORAL | Status: DC
  Administered 2017-10-03 – 2017-10-08 (×6): 8 mg via ORAL
  Filled 2017-10-02 (×6): qty 1

## 2017-10-02 MED ORDER — LEVOTHYROXINE SODIUM 175 MCG PO TABS
175.0000 ug | ORAL_TABLET | Freq: Every day | ORAL | Status: DC
  Administered 2017-10-03 – 2017-10-08 (×6): 175 ug via ORAL
  Filled 2017-10-02 (×6): qty 1

## 2017-10-02 MED ORDER — INSULIN ASPART 100 UNIT/ML ~~LOC~~ SOLN
0.0000 [IU] | Freq: Three times a day (TID) | SUBCUTANEOUS | Status: DC
  Administered 2017-10-03: 2 [IU] via SUBCUTANEOUS
  Administered 2017-10-03: 3 [IU] via SUBCUTANEOUS
  Administered 2017-10-03 – 2017-10-04 (×3): 2 [IU] via SUBCUTANEOUS
  Administered 2017-10-04: 1 [IU] via SUBCUTANEOUS
  Administered 2017-10-05 (×2): 2 [IU] via SUBCUTANEOUS
  Administered 2017-10-05: 1 [IU] via SUBCUTANEOUS
  Administered 2017-10-06: 3 [IU] via SUBCUTANEOUS
  Administered 2017-10-06 (×2): 2 [IU] via SUBCUTANEOUS
  Administered 2017-10-07: 3 [IU] via SUBCUTANEOUS
  Administered 2017-10-07: 2 [IU] via SUBCUTANEOUS
  Administered 2017-10-07: 3 [IU] via SUBCUTANEOUS
  Administered 2017-10-08: 2 [IU] via SUBCUTANEOUS
  Administered 2017-10-08: 3 [IU] via SUBCUTANEOUS

## 2017-10-02 MED ORDER — POTASSIUM CHLORIDE CRYS ER 10 MEQ PO TBCR
10.0000 meq | EXTENDED_RELEASE_TABLET | Freq: Two times a day (BID) | ORAL | Status: DC
  Administered 2017-10-03 – 2017-10-06 (×8): 10 meq via ORAL
  Filled 2017-10-02 (×10): qty 1

## 2017-10-02 MED ORDER — IOPAMIDOL (ISOVUE-370) INJECTION 76%
INTRAVENOUS | Status: AC
  Administered 2017-10-02: 100 mL
  Filled 2017-10-02: qty 100

## 2017-10-02 MED ORDER — DEXTROSE 5 % IV SOLN
1.0000 g | Freq: Once | INTRAVENOUS | Status: AC
  Administered 2017-10-02: 1 g via INTRAVENOUS
  Filled 2017-10-02: qty 10

## 2017-10-02 MED ORDER — ACETAMINOPHEN 325 MG PO TABS
650.0000 mg | ORAL_TABLET | Freq: Four times a day (QID) | ORAL | Status: DC | PRN
  Administered 2017-10-02: 650 mg via ORAL
  Filled 2017-10-02: qty 2

## 2017-10-02 MED ORDER — AZITHROMYCIN 250 MG PO TABS
250.0000 mg | ORAL_TABLET | Freq: Every day | ORAL | Status: DC
  Administered 2017-10-03: 250 mg via ORAL
  Filled 2017-10-02: qty 1

## 2017-10-02 MED ORDER — FUROSEMIDE 10 MG/ML IJ SOLN: 40.0000 mg | Freq: Two times a day (BID) | INTRAMUSCULAR | Status: DC

## 2017-10-02 MED ORDER — PRAMIPEXOLE DIHYDROCHLORIDE 0.25 MG PO TABS
0.5000 mg | ORAL_TABLET | Freq: Two times a day (BID) | ORAL | Status: DC
  Administered 2017-10-03 – 2017-10-08 (×11): 0.5 mg via ORAL
  Filled 2017-10-02 (×11): qty 2

## 2017-10-02 MED ORDER — ESCITALOPRAM OXALATE 10 MG PO TABS
10.0000 mg | ORAL_TABLET | Freq: Every day | ORAL | Status: DC
  Administered 2017-10-03 – 2017-10-08 (×6): 10 mg via ORAL
  Filled 2017-10-02 (×6): qty 1

## 2017-10-02 MED ORDER — TAMSULOSIN HCL 0.4 MG PO CAPS
0.4000 mg | ORAL_CAPSULE | Freq: Every day | ORAL | Status: DC
  Administered 2017-10-03 – 2017-10-08 (×6): 0.4 mg via ORAL
  Filled 2017-10-02 (×6): qty 1

## 2017-10-02 MED ORDER — SODIUM CHLORIDE 0.9 % IV BOLUS (SEPSIS)
500.0000 mL | Freq: Once | INTRAVENOUS | Status: AC
  Administered 2017-10-02: 500 mL via INTRAVENOUS

## 2017-10-02 MED ORDER — BACLOFEN 10 MG PO TABS: 10.0000 mg | ORAL_TABLET | Freq: Three times a day (TID) | ORAL | Status: DC | PRN

## 2017-10-02 MED ORDER — DEXTROSE 5 % IV SOLN
1.0000 g | INTRAVENOUS | Status: DC
  Administered 2017-10-03 – 2017-10-05 (×3): 1 g via INTRAVENOUS
  Filled 2017-10-02 (×3): qty 10

## 2017-10-02 MED ORDER — HYDRALAZINE HCL 25 MG PO TABS
25.0000 mg | ORAL_TABLET | Freq: Three times a day (TID) | ORAL | Status: DC
  Administered 2017-10-03 – 2017-10-08 (×17): 25 mg via ORAL
  Filled 2017-10-02 (×17): qty 1

## 2017-10-02 MED ORDER — POLYETHYLENE GLYCOL 3350 17 G PO PACK: 17.0000 g | PACK | Freq: Every day | ORAL | Status: DC | PRN

## 2017-10-02 NOTE — ED Notes (Signed)
Pt changed from void, and repositioned. Rectal temp 99.1. Pt provided with urinal and notified of need for urine sample.

## 2017-10-02 NOTE — ED Notes (Signed)
Patient transported to CT 

## 2017-10-02 NOTE — ED Triage Notes (Signed)
Pt presents to the ed with complaints of feeling generally ill since this am. Pt oxygen levels were 80% on RA with ems, after being placed on oxygen the patient reported feeling better. Pt was febrile at home.

## 2017-10-02 NOTE — ED Notes (Signed)
Condom catheter placed on pt at this time, family remains bedside.

## 2017-10-02 NOTE — H&P (Signed)
[facility] History and Physical  Travis [name]:[id number] DOB: 1946-06-24 DOA: 10/02/2017  Referring physician:  PCP: [name], MD  Specialists:   Chief Complaint: fever   HPI: Travis [name] is a 71 y.o. male with PMH of dHF, HTN, CKD, h/o Tobacco  Use, DM, Recent UTI (august) presented with fever. Patient states that he woke up in the morning with fever at 103. Then he had an episode of nausea, vomiting. Denies abdominal pains, no hematemesis. He also reports shortness of breath, dyspnea on exertion associated with progressive bilateral leg edema. He reports mild non productive cough which started today. denies acute chest pains, no dizziness. He had similar symptoms in the past with sepsis, urinary tract infection and pneumonias. He was treated with urinary tract infection in august, with levofloxacin per his daughter in law. He is being evaluated by neurology for neuropathy as outpatient. -ED: ct chest was neg for PE. Febrile. Started on iv ceftriaxone for UTI. hospitalits is called for admission   Review of Systems: The patient denies anorexia, fever, weight loss,, vision loss, decreased hearing, hoarseness, chest pain, syncope, dyspnea on exertion, peripheral edema, balance deficits, hemoptysis, abdominal pain, melena, hematochezia, severe indigestion/heartburn, hematuria, incontinence, genital sores, muscle weakness, suspicious skin lesions, transient blindness, difficulty walking, depression, unusual weight change, abnormal bleeding, enlarged lymph nodes, angioedema, and breast masses.    Past Medical History:  Diagnosis Date  . Cervical myelopathy (HCC)    with myelomalacia at C5-6 and C3-4  . CHF (congestive heart failure) ([place])   . Constipation - functional    controlled with miralax  . DJD (degenerative joint disease)    WHOLE SPINE  . Dyspnea   . Gait disorder   . Heart murmur    Evaluated by cardiologist 4-5 years ago but does not currently see a  cardiologist  . Hypertension   . Hyperthyroidism    Had Iodine to resolve issue DX 10 YR AGO  . Hypothyroidism    Low d/t treatment of hyperthyroidism  . 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 Peripheral neuropathy   . Pneumonia   . PONV (postoperative nausea and vomiting)   . RBBB   . Renal disorder    acute kidney failure  . Restless legs syndrome 03/14/2015  . RLS (restless legs syndrome)   . Sleep apnea    DX  2009/2010 study done at [facility]  . 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 Type II diabetes mellitus ([place])    diet controlled   Past Surgical History:  Procedure Laterality Date  . BACK SURGERY     6 surgeries  . CARDIAC CATHETERIZATION [name] 09/07/2015   Procedure: Right/Left Heart Cath and Coronary Angiography;  Surgeon: Peter M Martinique, MD;  Location: [place] [facility];  Service: Cardiovascular;  Laterality: [name];  . CHOLECYSTECTOMY    . LUMBAR LAMINECTOMY/DECOMPRESSION MICRODISCECTOMY  07/14/2012   Procedure: LUMBAR LAMINECTOMY/DECOMPRESSION MICRODISCECTOMY 2 LEVELS;  Surgeon: Eustace Moore, MD;  Location: [place] [facility];  Service: Neurosurgery;  Laterality: Left;  Lumbar two three laminectomy, left thoracic four five transpedicular diskectomy   Social History:  reports that he quit smoking about 32 years ago. His smoking use included Cigarettes. He has a 40.00 pack-year smoking history. He quit smokeless tobacco use about 32 years ago. He reports that he does not drink alcohol or use drugs. Home;  where does patient live--home, ALF, SNF? and with whom if at home? Yes;  Can patient participate in ADLs?  Allergies  Allergen Reactions  . Other Other (See Comments)    Anesthesia makes sick, must have antinausea medication in advance.  . Levofloxacin In D5w Itching    Possible infusion reaction 03/29/17. Tolerating oral Levaquin 03/30/17    Family History  Problem Relation Age of Onset  . Polycythemia Mother   . Diabetes Father   . Hypertension Father   . Heart disease  Father   . CAD Unknown        1 of his 4 brothers and father has CAD    (be sure to complete)  Prior to Admission medications   Medication Sig Start Date End Date Taking? Authorizing Provider  aspirin EC 81 MG EC tablet Take 1 tablet (81 mg total) by mouth daily. 08/23/15   Thurnell Lose, MD  baclofen (LIORESAL) 10 MG tablet Take 1 tablet (10 mg total) by mouth 3 (three) times daily. For muscle spasms 10/30/14   Meredith Staggers, MD  escitalopram (LEXAPRO) 10 MG tablet Take 10 mg by mouth daily. 07/12/15   [provider]  gabapentin (NEURONTIN) 400 MG capsule TAKE 1 CAPSULE (400 MG TOTAL) BY MOUTH EVERY 8 (EIGHT) HOURS. 04/16/15   Meredith Staggers, MD  glimepiride (AMARYL) 2 MG tablet Take 2 mg by mouth daily with breakfast.    [provider]  hydrALAZINE (APRESOLINE) 25 MG tablet Take 1 tablet (25 mg total) by mouth every 8 (eight) hours. 08/23/15   Thurnell Lose, MD  levothyroxine (SYNTHROID, LEVOTHROID) 175 MCG tablet Take 175 mcg by mouth daily. 08/12/15   [provider]  losartan (COZAAR) 50 MG tablet TAKE 1 TABLET BY MOUTH ONCE DAILY. 05/06/17   Nahser, Wonda Cheng, MD  polyethylene glycol (MIRALAX / GLYCOLAX) packet Take 17 g by mouth daily as needed. For constipation Patient taking differently: Take 17 g by mouth daily as needed for moderate constipation.  08/09/14   Love, Ivan Anchors, [name]  potassium chloride (KLOR-CON 10) 10 MEQ tablet Take 1 tablet (10 mEq total) by mouth 2 (two) times daily. 09/25/15   Nahser, Wonda Cheng, MD  pramipexole (MIRAPEX) 0.5 MG tablet Take 0.5 mg by mouth 2 (two) times daily.  08/09/14   Love, Ivan Anchors, [name]  tamsulosin (FLOMAX) 0.4 MG CAPS capsule Take 0.4 mg by mouth daily.  09/13/14   [provider]  torsemide (DEMADEX) 20 MG tablet Take 1 tablet (20 mg total) by mouth 2 (two) times daily. Take additional 10 mg as needed for weight gain or shortness of breath. 02/18/17   Bhagat, Bhavinkumar, PA  TOVIAZ 8 MG TB24 tablet Take 8  mg by mouth daily. 08/07/15   [provider]   Physical Exam: Vitals:   10/02/17 1245 10/02/17 1447  BP: (!) 144/69 (!) 169/68  Pulse: 77 92  Resp: (!) 26 20  Temp:  (!) 100.8 F (38.2 C)  SpO2: 94% 93%     General:  Alert. No distress   Eyes: eom-i  ENT: no oral ulcers   Neck: supple.,   Cardiovascular: s1,s2 rrr  Respiratory: BL crackles  Abdomen: soft, obese. nt  Skin: no rash   Musculoskeletal: BL leg edema   Psychiatric: no hallucinations   Neurologic: cn 2-12 intact. Motor 5/5 BL UE. Lower extremity preserved   Labs on Admission:  Basic Metabolic Panel:  Recent Labs Lab 10/02/17 1005  NA 139  K 4.1  CL 104  CO2 29  GLUCOSE 200*  BUN 34*  CREATININE 1.89*  CALCIUM 8.8*   Liver  Function Tests:  Recent Labs Lab 10/02/17 1005  AST 25  ALT 19  ALKPHOS 75  BILITOT 1.0  PROT 6.9  ALBUMIN 3.7   No results for input(s): LIPASE, AMYLASE in the last 168 hours. No results for input(s): AMMONIA in the last 168 hours. CBC:  Recent Labs Lab 10/02/17 1005  WBC 14.6*  NEUTROABS 13.0*  HGB 12.2*  HCT 38.3*  MCV 94.1  PLT 144*   Cardiac Enzymes: No results for input(s): CKTOTAL, CKMB, CKMBINDEX, TROPONINI in the last 168 hours.  BNP (last 3 results)  Recent Labs  03/26/17 2339 10/02/17 0944  BNP 265.1* 169.8*    ProBNP (last 3 results)  Recent Labs  02/12/17 1522  PROBNP 109    CBG: No results for input(s): GLUCAP in the last 168 hours.  Radiological Exams on Admission: Ct Angio Chest Pe W/cm &/or Wo Cm  Result Date: 10/02/2017 CLINICAL DATA:  Suspect pulmonary embolus. Intermediate probability. EXAM: CT ANGIOGRAPHY CHEST WITH CONTRAST TECHNIQUE: Multidetector CT imaging of the chest was performed using the standard protocol during bolus administration of intravenous contrast. Multiplanar CT image reconstructions and MIPs were obtained to evaluate the vascular anatomy. CONTRAST:  80 cc of Isovue 370 COMPARISON:  None  FINDINGS: Cardiovascular: Exam detail is diminished secondary to suboptimal pulmonary arterial opacification and respiratory motion artifact. The main pulmonary artery appears patent. The main pulmonary artery measures 4.1 cm in diameter suggesting PA hypertension. No saddle embolus. No proximal lobar pulmonary artery filling defects identified. Beyond the level of the proximal pulmonary arteries there is significantly diminished pulmonary artery detail limiting evaluation. Normal heart size. Aortic atherosclerosis. Calcification in the LAD and left circumflex and RCA coronary artery is noted. Mediastinum/Nodes: The trachea appears patent and is midline. Normal appearance of the esophagus. No mediastinal or hilar adenopathy. No axillary or supraclavicular adenopathy. Lungs/Pleura: No pleural effusion identified. Subsegmental atelectasis involving the right lower lobe and left base identified. There is asymmetric elevation of the right hemidiaphragm. Upper Abdomen: No acute abnormality. Musculoskeletal: Previous pedicle screw and rod fixation of the lower thoracic spine. Review of the MIP images confirms the above findings. IMPRESSION: 1. Exam detail diminished secondary to suboptimal pulmonary arterial opacification and respiratory motion artifact. No saddle embolus or proximal lobar pulmonary artery filling defects identified. 2. Increase caliber of the main pulmonary artery suggestive of PA hypertension. 3. Bilateral lower lobe subsegmental atelectasis and asymmetric elevation of the right hemidiaphragm. 4. Aortic Atherosclerosis (ICD10-I70.0). Coronary artery calcifications noted involving the LAD, left circumflex and RCA. Electronically Signed   By: Kerby Moors M.D.   On: 10/02/2017 14:26   Ct Lumbar Spine Wo Contrast  Result Date: 10/01/2017 CLINICAL DATA:  Chronic low back pain. Unsteady gait. Previous spinal fusion. EXAM: CT LUMBAR SPINE WITHOUT CONTRAST TECHNIQUE: Multidetector CT imaging of the  lumbar spine was performed without intravenous contrast administration. Multiplanar CT image reconstructions were also generated. COMPARISON:  MRI 07/05/2012. FINDINGS: Segmentation: 5 lumbar type vertebral bodies. Alignment: Straightening of the normal lumbar lordosis with slight kyphotic curvature. 2 mm retrolisthesis L2-3. 3 mm retrolisthesis L3-4. 4 mm retrolisthesis L4-5. Vertebrae: Chronic mild endplate deformity at the L2-3 level. No other focal finding or evidence of fracture. Paraspinal and other soft tissues: Aortic atherosclerosis. No aneurysm. Disc levels: T12-L1: Some disc calcification. Mild annular bulging. Mild facet arthritis. No compressive stenosis suspected. L1-2: Mild bulging of the disc. Mild facet and ligamentous hypertrophy. Mild multifactorial stenosis without visible neural compression. L2-3: Previous posterior decompression. 2 mm retrolisthesis. Chronic disc degeneration  with complete loss of disc height and vacuum phenomenon. Circumferential endplate osteophytes. Mild facet hypertrophy. Stenosis of the lateral recesses and neural foramina on each side. L3-4: Previous posterior decompression. 3 mm retrolisthesis. Chronic disc degeneration with complete loss of disc height and vacuum phenomenon. Endplate osteophytes. Stenosis of both lateral recesses and neural foramina. L4-5: Previous posterior decompression. 4 mm retrolisthesis. Disc degeneration with mild disc space narrowing and vacuum phenomenon. Endplate osteophytes and circumferential protrusion of the disc. Facet and ligamentous hypertrophy. Probable severe stenosis immediately below the decompression. Stenosis also affects the lateral recesses and neural foramina. L5-S1: Endplate osteophytes and shallow protrusion of the disc. No compressive stenosis of the central canal. Mild bilateral foraminal narrowing without definite compression of the exiting L5 nerves. IMPRESSION: Posterior decompression at L2-3, L3-4 and L4-5.  Retrolisthesis at all those levels. Lateral recess and foraminal narrowing at L2-3 and L3-4 that could cause neural compression. Likely severe multifactorial spinal stenosis at and just below the L4-5 level. Lateral recess and foraminal stenosis as well. Neural compression seems likely at this level. L5-S1 endplate osteophytes and disc protrusion without apparent compressive stenosis. Mild foraminal narrowing. Electronically Signed   By: Nelson Chimes M.D.   On: 10/01/2017 11:47   Dg Chest Port 1 View  Result Date: 10/02/2017 CLINICAL DATA:  Shortness of breath and hypertension EXAM: PORTABLE CHEST 1 VIEW COMPARISON:  March 29, 2017 FINDINGS: There is bibasilar atelectatic change. These no edema or consolidation. Heart size in and pulmonary vascularity are normal. No adenopathy. There is postoperative change in the lower cervical and lower thoracic spine. There is aortic atherosclerosis. IMPRESSION: Bibasilar atelectasis. No edema or consolidation. Stable cardiac silhouette. There is aortic atherosclerosis. Aortic Atherosclerosis (ICD10-I70.0). Electronically Signed   By: Lowella Grip III M.D.   On: 10/02/2017 09:55    EKG: Independently reviewed.   Assessment/Plan Active Problems:   Cervical spondylosis with myelopathy   HTN (hypertension)   Acute lower UTI   Acute diastolic heart failure (HCC)   DOE (dyspnea on exertion)   Acute on chronic diastolic CHF (congestive heart failure) ([place])   71 y.o. male with PMH of dHF, HTN, CKD, h/o Tobacco  Use, DM, Recent UTI (august) presented with fever, urinary tract infection, hypoxia   Fever. UTI. Will cont iv antibiotic treatment. Obtain cultures. Lactate 0.8. Check renal ultrasound due to recurrent infections   Acute on chronic dCHF. congested don exam with bl crackles, leg edema. Received iv fluids 500 cc in on admission. Will give lasix IV. Monitor I/o, daily weight, urine output. Hold losartan with active diuresis and iv contrast exposure for 48  hrs   Acute hypoxia. Likely due to heart failure/edema. Ct chest showed no PE. Hypoxia->resolved with nasal oxygen. Will cont diuresis. Oxygen, NiPPV as needed. Recommended to f/u with PFTs as outpatient due to h/o tobacco use r/o underlying copd. Will give z pack with ceftriaxone due to possible early pneumonia. +cough, ct atelectasis vs infiltrate   Hypothyroidism. Check tsh. Cont levothyroxine  DM. Monitor on ISS  Cardiology.  if consultant consulted, please document name and whether formally or informally consulted  Code Status: full (must indicate code status--if unknown or must be presumed, indicate so) Family Communication: d/w patient, his family.  (indicate person spoken with, if applicable, with phone number if by telephone) Disposition Plan: home pend clinical improvement  (indicate anticipated LOS)  Time spent: >45 minutes   Kinnie Feil [facility] Pager [phone]  If 7PM-7AM, please contact night-coverage [email or web address] Password [email or web address] 10/02/2017, 3:29  PM

## 2017-10-02 NOTE — ED Notes (Signed)
Pt's meds not verified by pharmacy at this time.

## 2017-10-02 NOTE — ED Provider Notes (Signed)
[facility] DEPT Provider Note   CSN: [id number] Arrival date & time: 10/02/17  0902     History   Chief Complaint Chief Complaint  Patient presents with  . Shortness of Breath    HPI Travis [name] is a 71 y.o. male.  He presents for evaluation of shortness of breath, with hypoxia, first noticed this morning at home.  Also vomited once, this morning.  He also states that his temperature was "103,", this morning after which he took some Tylenol.  No other recent illnesses.  He is being followed closely by neurology for weakness, and back pain.  He had a lumbar CAT scan yesterday to follow-up on a recent myelogram, and EMG testing.  After that he was referred to neurosurgery, according to his daughter, who is with him now.  He does not use oxygen at home.  He denies cough.  He denies recent changes in bowel or urinary habits.  He denies dysuria or hematuria.  There are no other known modifying factors.  HPI  Past Medical History:  Diagnosis Date  . Cervical myelopathy (HCC)    with myelomalacia at C5-6 and C3-4  . CHF (congestive heart failure) ([place])   . Constipation - functional    controlled with miralax  . DJD (degenerative joint disease)    WHOLE SPINE  . Dyspnea   . Gait disorder   . Heart murmur    Evaluated by cardiologist 4-5 years ago but does not currently see a cardiologist  . Hypertension   . Hyperthyroidism    Had Iodine to resolve issue DX 10 YR AGO  . Hypothyroidism    Low d/t treatment of hyperthyroidism  . 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 Peripheral neuropathy   . Pneumonia   . PONV (postoperative nausea and vomiting)   . RBBB   . Renal disorder    acute kidney failure  . Restless legs syndrome 03/14/2015  . RLS (restless legs syndrome)   . Sleep apnea    DX  2009/2010 study done at [facility]  . 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 Type II diabetes mellitus ([place])    diet controlled    Patient Active Problem List   Diagnosis Date Noted   . Urinary tract infection without hematuria   . Acute on chronic diastolic CHF (congestive heart failure) ([place])   . Chronic diastolic CHF (congestive heart failure) ([place])   . Elevated troponin I level   . Acute on chronic combined systolic and diastolic CHF (congestive heart failure) ([place])   . Type II diabetes mellitus ([place]) 08/19/2015  . HCAP (healthcare-associated pneumonia) 08/19/2015  . Hyperthyroidism 08/19/2015  . Hypothyroid 08/19/2015  . AKI (acute kidney injury) ([place]) 08/19/2015  . Blood poisoning   . Chronic combined systolic and diastolic congestive heart failure ([place]) 08/17/2015  . Restless legs syndrome 03/14/2015  . DOE (dyspnea on exertion) 03/09/2015  . Sinus bradycardia 09/21/2014  . Thoracic spondylosis with myelopathy 09/13/2014  . Acute diastolic heart failure ([place]) 07/18/2014  . Cervical arthritis with myelopathy 07/18/2014  . Constipation - functional   . CAP (community acquired pneumonia) 07/10/2014  . Sepsis ([place]) 07/10/2014  . HTN (hypertension) 07/10/2014  . OSA on CPAP 07/10/2014  . Acute lower UTI 07/10/2014  . Edema leg 07/10/2014  . Acute respiratory failure with hypoxia ([place]) 07/10/2014  . 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 Other vitamin B12 deficiency anemia 07/13/2013  . 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  Polyneuropathy in other diseases classified elsewhere ([place]) 02/17/2013  . Other general symptoms(780.99) 02/17/2013  . Abnormality of gait 02/17/2013  . Cervical spondylosis with myelopathy 02/17/2013  . Back ache 09/15/2012    Past Surgical History:  Procedure Laterality Date  . BACK SURGERY     6 surgeries  . CARDIAC CATHETERIZATION [name] 09/07/2015   Procedure: Right/Left Heart Cath and Coronary Angiography;  Surgeon: Peter M Martinique, MD;  Location: [facility];  Service: Cardiovascular;  Laterality: [name];  . CHOLECYSTECTOMY    . LUMBAR LAMINECTOMY/DECOMPRESSION MICRODISCECTOMY  07/14/2012    Procedure: LUMBAR LAMINECTOMY/DECOMPRESSION MICRODISCECTOMY 2 LEVELS;  Surgeon: Eustace Moore, MD;  Location: [facility];  Service: Neurosurgery;  Laterality: Left;  Lumbar two three laminectomy, left thoracic four five transpedicular diskectomy       Home Medications    Prior to Admission medications   Medication Sig Start Date End Date Taking? Authorizing Provider  aspirin EC 81 MG EC tablet Take 1 tablet (81 mg total) by mouth daily. 08/23/15   Thurnell Lose, MD  baclofen (LIORESAL) 10 MG tablet Take 1 tablet (10 mg total) by mouth 3 (three) times daily. For muscle spasms 10/30/14   Meredith Staggers, MD  escitalopram (LEXAPRO) 10 MG tablet Take 10 mg by mouth daily. 07/12/15   [provider]  gabapentin (NEURONTIN) 400 MG capsule TAKE 1 CAPSULE (400 MG TOTAL) BY MOUTH EVERY 8 (EIGHT) HOURS. 04/16/15   Meredith Staggers, MD  glimepiride (AMARYL) 2 MG tablet Take 2 mg by mouth daily with breakfast.    [provider]  hydrALAZINE (APRESOLINE) 25 MG tablet Take 1 tablet (25 mg total) by mouth every 8 (eight) hours. 08/23/15   Thurnell Lose, MD  levothyroxine (SYNTHROID, LEVOTHROID) 175 MCG tablet Take 175 mcg by mouth daily. 08/12/15   [provider]  losartan (COZAAR) 50 MG tablet TAKE 1 TABLET BY MOUTH ONCE DAILY. 05/06/17   Nahser, Wonda Cheng, MD  polyethylene glycol (MIRALAX / GLYCOLAX) packet Take 17 g by mouth daily as needed. For constipation Patient taking differently: Take 17 g by mouth daily as needed for moderate constipation.  08/09/14   Love, Ivan Anchors, [name]  potassium chloride (KLOR-CON 10) 10 MEQ tablet Take 1 tablet (10 mEq total) by mouth 2 (two) times daily. 09/25/15   Nahser, Wonda Cheng, MD  pramipexole (MIRAPEX) 0.5 MG tablet Take 0.5 mg by mouth 2 (two) times daily.  08/09/14   Love, Ivan Anchors, [name]  tamsulosin (FLOMAX) 0.4 MG CAPS capsule Take 0.4 mg by mouth daily.  09/13/14   [provider]  torsemide (DEMADEX) 20 MG tablet Take 1 tablet  (20 mg total) by mouth 2 (two) times daily. Take additional 10 mg as needed for weight gain or shortness of breath. 02/18/17   Bhagat, Bhavinkumar, PA  TOVIAZ 8 MG TB24 tablet Take 8 mg by mouth daily. 08/07/15   [provider]    Family History Family History  Problem Relation Age of Onset  . Polycythemia Mother   . Diabetes Father   . Hypertension Father   . Heart disease Father   . CAD Unknown        1 of his 4 brothers and father has CAD    Social History Social History  Substance Use Topics  . Smoking status: Former Smoker    Packs/day: 2.00    Years: 20.00    Types: Cigarettes    Quit date: 06/28/1985  . Smokeless tobacco: Former  User    Quit date: 06/28/1985  . Alcohol use No     Allergies   Other and Levofloxacin in d5w   Review of Systems Review of Systems  All other systems reviewed and are negative.    Physical Exam Updated Vital Signs BP (!) 169/68   Pulse 92   Temp (!) 100.8 F (38.2 C) (Oral)   Resp 20   Ht 6\' 1"  (1.854 m)   Wt 131.5 kg (290 lb)   SpO2 93%   BMI 38.26 kg/m   Physical Exam  Constitutional: He is oriented to person, place, and time. He appears well-developed. He appears distressed (Uncomfortable, and sleepy.).  Elderly, frail  HENT:  Head: Normocephalic and atraumatic.  Right Ear: External ear normal.  Left Ear: External ear normal.  Eyes: Pupils are equal, round, and reactive to light. Conjunctivae and EOM are normal.  Neck: Normal range of motion and phonation normal. Neck supple.  Cardiovascular: Normal rate, regular rhythm and normal heart sounds.   Pulmonary/Chest: Effort normal. No respiratory distress. He has no rales. He exhibits no tenderness and no bony tenderness.  Somewhat decreased air movement bilaterally.  Abdominal: Soft. There is no tenderness.  Musculoskeletal: Normal range of motion. He exhibits edema (3+ lower legs bilaterally). He exhibits no tenderness or deformity.  Neurological: He is alert and  oriented to person, place, and time. No cranial nerve deficit or sensory deficit. He exhibits normal muscle tone. Coordination normal.  Skin: Skin is warm, dry and intact.  Psychiatric: He has a normal mood and affect. His behavior is normal. Judgment and thought content normal.  Nursing note and vitals reviewed.    ED Treatments / Results  Labs (all labs ordered are listed, but only abnormal results are displayed) Labs Reviewed  COMPREHENSIVE METABOLIC PANEL - Abnormal; Notable for the following:       Result Value   Glucose, Bld 200 (*)    BUN 34 (*)    Creatinine, Ser 1.89 (*)    Calcium 8.8 (*)    GFR calc non Af Amer 34 (*)    GFR calc Af Amer 39 (*)    All other components within normal limits  CBC WITH DIFFERENTIAL/PLATELET - Abnormal; Notable for the following:    WBC 14.6 (*)    RBC 4.07 (*)    Hemoglobin 12.2 (*)    HCT 38.3 (*)    RDW 16.1 (*)    Platelets 144 (*)    Neutro Abs 13.0 (*)    Lymphs Abs 0.5 (*)    Monocytes Absolute 1.1 (*)    All other components within normal limits  URINALYSIS, ROUTINE W REFLEX MICROSCOPIC - Abnormal; Notable for the following:    APPearance HAZY (*)    Hgb urine dipstick SMALL (*)    Nitrite POSITIVE (*)    Leukocytes, UA LARGE (*)    Bacteria, UA RARE (*)    Squamous Epithelial / LPF 0-5 (*)    All other components within normal limits  BRAIN NATRIURETIC PEPTIDE - Abnormal; Notable for the following:    B Natriuretic Peptide 169.8 (*)    All other components within normal limits  D-DIMER, QUANTITATIVE (NOT AT [facility]) - Abnormal; Notable for the following:    D-Dimer, Quant 0.82 (*)    All other components within normal limits  CULTURE, BLOOD (ROUTINE X 2)  CULTURE, BLOOD (ROUTINE X 2)  URINE CULTURE  INFLUENZA PANEL BY PCR (TYPE A & B)  I-STAT CG4 LACTIC ACID,  ED  I-STAT CG4 LACTIC ACID, ED   BUN  Date Value Ref Range Status  10/02/2017 34 (H) 6 - 20 mg/dL Final  07/14/2017 40 (H) 8 - 27 mg/dL Final  04/14/2017 31  (H) 8 - 27 mg/dL Final  04/01/2017 51 (H) 6 - 20 mg/dL Final  03/31/2017 46 (H) 6 - 20 mg/dL Final  03/30/2017 47 (H) 6 - 20 mg/dL Final  02/18/2017 44 (H) 8 - 27 mg/dL Final  02/12/2017 37 (H) 8 - 27 mg/dL Final   Creat  Date Value Ref Range Status  09/24/2016 1.66 (H) 0.70 - 1.18 mg/dL Final    Comment:      For patients > or = 73 years of age: The upper reference limit for Creatinine is approximately 13% higher for people identified as African-American.     09/05/2016 1.88 (H) 0.70 - 1.18 mg/dL Final    Comment:      For patients > or = 73 years of age: The upper reference limit for Creatinine is approximately 13% higher for people identified as African-American.     08/01/2016 1.56 (H) 0.70 - 1.18 mg/dL Final    Comment:      For patients > or = 73 years of age: The upper reference limit for Creatinine is approximately 13% higher for people identified as African-American.     07/23/2016 1.64 (H) 0.70 - 1.18 mg/dL Final    Comment:      For patients > or = 73 years of age: The upper reference limit for Creatinine is approximately 13% higher for people identified as African-American.      Creatinine, Ser  Date Value Ref Range Status  10/02/2017 1.89 (H) 0.61 - 1.24 mg/dL Final  07/14/2017 1.57 (H) 0.76 - 1.27 mg/dL Final  04/14/2017 1.34 (H) 0.76 - 1.27 mg/dL Final  04/01/2017 1.52 (H) 0.61 - 1.24 mg/dL Final     EKG  EKG Interpretation  Date/Time:  Friday October 02 2017 09:10:10 EDT Ventricular Rate:  75 PR Interval:  162 QRS Duration: 138 QT Interval:  434 QTC Calculation: 484 R Axis:   6 Text Interpretation:  Normal sinus rhythm Right bundle branch block Abnormal ECG since last tracing no significant change Confirmed by Daleen Bo [phone]) on 10/02/2017 9:41:27 AM       Radiology Ct Angio Chest Pe W/cm &/or Wo Cm  Result Date: 10/02/2017 CLINICAL DATA:  Suspect pulmonary embolus. Intermediate probability. EXAM: CT ANGIOGRAPHY CHEST WITH  CONTRAST TECHNIQUE: Multidetector CT imaging of the chest was performed using the standard protocol during bolus administration of intravenous contrast. Multiplanar CT image reconstructions and MIPs were obtained to evaluate the vascular anatomy. CONTRAST:  80 cc of Isovue 370 COMPARISON:  None FINDINGS: Cardiovascular: Exam detail is diminished secondary to suboptimal pulmonary arterial opacification and respiratory motion artifact. The main pulmonary artery appears patent. The main pulmonary artery measures 4.1 cm in diameter suggesting PA hypertension. No saddle embolus. No proximal lobar pulmonary artery filling defects identified. Beyond the level of the proximal pulmonary arteries there is significantly diminished pulmonary artery detail limiting evaluation. Normal heart size. Aortic atherosclerosis. Calcification in the LAD and left circumflex and RCA coronary artery is noted. Mediastinum/Nodes: The trachea appears patent and is midline. Normal appearance of the esophagus. No mediastinal or hilar adenopathy. No axillary or supraclavicular adenopathy. Lungs/Pleura: No pleural effusion identified. Subsegmental atelectasis involving the right lower lobe and left base identified. There is asymmetric elevation of the right hemidiaphragm. Upper Abdomen: No acute abnormality. Musculoskeletal:  Previous pedicle screw and rod fixation of the lower thoracic spine. Review of the MIP images confirms the above findings. IMPRESSION: 1. Exam detail diminished secondary to suboptimal pulmonary arterial opacification and respiratory motion artifact. No saddle embolus or proximal lobar pulmonary artery filling defects identified. 2. Increase caliber of the main pulmonary artery suggestive of PA hypertension. 3. Bilateral lower lobe subsegmental atelectasis and asymmetric elevation of the right hemidiaphragm. 4. Aortic Atherosclerosis (ICD10-I70.0). Coronary artery calcifications noted involving the LAD, left circumflex and RCA.  Electronically Signed   By: Kerby Moors M.D.   On: 10/02/2017 14:26   Ct Lumbar Spine Wo Contrast  Result Date: 10/01/2017 CLINICAL DATA:  Chronic low back pain. Unsteady gait. Previous spinal fusion. EXAM: CT LUMBAR SPINE WITHOUT CONTRAST TECHNIQUE: Multidetector CT imaging of the lumbar spine was performed without intravenous contrast administration. Multiplanar CT image reconstructions were also generated. COMPARISON:  MRI 07/05/2012. FINDINGS: Segmentation: 5 lumbar type vertebral bodies. Alignment: Straightening of the normal lumbar lordosis with slight kyphotic curvature. 2 mm retrolisthesis L2-3. 3 mm retrolisthesis L3-4. 4 mm retrolisthesis L4-5. Vertebrae: Chronic mild endplate deformity at the L2-3 level. No other focal finding or evidence of fracture. Paraspinal and other soft tissues: Aortic atherosclerosis. No aneurysm. Disc levels: T12-L1: Some disc calcification. Mild annular bulging. Mild facet arthritis. No compressive stenosis suspected. L1-2: Mild bulging of the disc. Mild facet and ligamentous hypertrophy. Mild multifactorial stenosis without visible neural compression. L2-3: Previous posterior decompression. 2 mm retrolisthesis. Chronic disc degeneration with complete loss of disc height and vacuum phenomenon. Circumferential endplate osteophytes. Mild facet hypertrophy. Stenosis of the lateral recesses and neural foramina on each side. L3-4: Previous posterior decompression. 3 mm retrolisthesis. Chronic disc degeneration with complete loss of disc height and vacuum phenomenon. Endplate osteophytes. Stenosis of both lateral recesses and neural foramina. L4-5: Previous posterior decompression. 4 mm retrolisthesis. Disc degeneration with mild disc space narrowing and vacuum phenomenon. Endplate osteophytes and circumferential protrusion of the disc. Facet and ligamentous hypertrophy. Probable severe stenosis immediately below the decompression. Stenosis also affects the lateral recesses and  neural foramina. L5-S1: Endplate osteophytes and shallow protrusion of the disc. No compressive stenosis of the central canal. Mild bilateral foraminal narrowing without definite compression of the exiting L5 nerves. IMPRESSION: Posterior decompression at L2-3, L3-4 and L4-5. Retrolisthesis at all those levels. Lateral recess and foraminal narrowing at L2-3 and L3-4 that could cause neural compression. Likely severe multifactorial spinal stenosis at and just below the L4-5 level. Lateral recess and foraminal stenosis as well. Neural compression seems likely at this level. L5-S1 endplate osteophytes and disc protrusion without apparent compressive stenosis. Mild foraminal narrowing. Electronically Signed   By: Nelson Chimes M.D.   On: 10/01/2017 11:47   Dg Chest Port 1 View  Result Date: 10/02/2017 CLINICAL DATA:  Shortness of breath and hypertension EXAM: PORTABLE CHEST 1 VIEW COMPARISON:  March 29, 2017 FINDINGS: There is bibasilar atelectatic change. These no edema or consolidation. Heart size in and pulmonary vascularity are normal. No adenopathy. There is postoperative change in the lower cervical and lower thoracic spine. There is aortic atherosclerosis. IMPRESSION: Bibasilar atelectasis. No edema or consolidation. Stable cardiac silhouette. There is aortic atherosclerosis. Aortic Atherosclerosis (ICD10-I70.0). Electronically Signed   By: Lowella Grip III M.D.   On: 10/02/2017 09:55    Procedures Procedures (including critical care time)  Medications Ordered in ED Medications  ondansetron (ZOFRAN) injection 4 mg (not administered)  cefTRIAXone (ROCEPHIN) 1 g in dextrose 5 % 50 mL IVPB (0 g Intravenous  Stopped 10/02/17 1443)  sodium chloride 0.9 % bolus 500 mL (0 mLs Intravenous Stopped 10/02/17 1443)  iopamidol (ISOVUE-370) 76 % injection (100 mLs  Contrast Given 10/02/17 1351)     Initial Impression / Assessment and Plan / ED Course  I have reviewed the triage vital signs and the nursing  notes.  Pertinent labs & imaging results that were available during my care of the patient were reviewed by me and considered in my medical decision making (see chart for details).  Clinical Course as of Oct 03 1455  Fri Oct 02, 2017  1308 D-dimer is elevated.  GFR is low at 34.  This is above the level for low-dose CTA, to rule out PE.  Findings were discussed with the patient and his daughters were nurses, and they agreed to proceed with CTA testing.  Rocephin started for UTI.  Lactate is normal.  [EW]  1453 High Temp: (!) 100.8 F (38.2 C) [EW]  1453 Normal Influenza A By PCR: NEGATIVE [EW]  3875 Normal Influenza B By PCR: NEGATIVE [EW]  1453 Elevated Temp: (!) 100.8 F (38.2 C) [EW]  1453 High D-Dimer, Quant: (!) 0.82 [EW]  1453 High WBC: (!) 14.6 [EW]  1454 Low Hemoglobin: (!) 12.2 [EW]  1454 B Natriuretic Peptide: (!) 169.8 [EW]  1454 Mild elevation B Natriuretic Peptide: (!) 169.8 [EW]  1454 Abnormal, consistent with UTI Nitrite: (!) POSITIVE [EW]  1454 Abnormal, consistent with UTI Leukocytes, UA: (!) LARGE [EW]  1454 No large pulmonary embolus CT Angio Chest PE W/Cm &/Or Wo Cm [EW]  1454 No acute changes DG Chest Port 1 View [EW]    Clinical Course User Index [EW] Daleen Bo, MD     Patient Vitals for the past 24 hrs:  BP Temp Temp src Pulse Resp SpO2 Height Weight  10/02/17 1447 (!) 169/68 (!) 100.8 F (38.2 C) Oral 92 20 93 % - -  10/02/17 1245 (!) 144/69 - - 77 (!) 26 94 % - -  10/02/17 1200 (!) 151/72 - - 76 (!) 28 91 % - -  10/02/17 1100 (!) 150/77 - - 79 (!) 24 91 % - -  10/02/17 1013 - - - - - - 6\' 1"  (1.854 m) 131.5 kg (290 lb)  10/02/17 0954 - 99.1 F (37.3 C) Rectal - - - - -  10/02/17 0930 (!) 111/52 - - 72 (!) 23 93 % - -  10/02/17 0914 - - - - - 93 % - -  10/02/17 0913 (!) 144/64 98.7 F (37.1 C) Oral 75 (!) 24 (!) 87 % - -   2:56 PM-Consult complete with hospitalist. Patient case explained and discussed.  They agree to admit patient for further  evaluation and treatment. Call ended at 72: 15  At time of disposition -reevaluation with update and discussion. After initial assessment and treatment, an updated evaluation reveals no change in clinical status.  Patient updated on findings and plan.Daleen Bo L   CRITICAL CARE Performed by: Richarda Blade Total critical care time: 35 minutes Critical care time was exclusive of separately billable procedures and treating other patients. Critical care was necessary to treat or prevent imminent or life-threatening deterioration. Critical care was time spent personally by me on the following activities: development of treatment plan with patient and/or surrogate as well as nursing, discussions with consultants, evaluation of patient's response to treatment, examination of patient, obtaining history from patient or surrogate, ordering and performing treatments and interventions, ordering and review of laboratory studies,  ordering and review of radiographic studies, pulse oximetry and re-evaluation of patient's condition.   Final Clinical Impressions(s) / ED Diagnoses   Final diagnoses:  Acute hypoxemic respiratory failure ([place])  Urinary tract infection without hematuria, site unspecified   Nonspecific shortness of breath with hypoxia.  No clear cause.  Doubt PE, pneumonia or acute congestive heart failure.  Evaluation is consistent with UTI, and abnormal urinalysis.  Doubt sepsis, metabolic instability or impending vascular collapse.  Patient will require hospitalization for further evaluation and treatment.  Nursing Notes Reviewed/ Care Coordinated Applicable Imaging Reviewed Interpretation of Laboratory Data incorporated into ED treatment  Plan: Admit   New Prescriptions New Prescriptions   No medications on file     Daleen Bo, MD 10/05/17 1254

## 2017-10-02 NOTE — Consult Note (Signed)
Cardiology Consult    Patient ID: Travis [name]; [id number]; Apr 06, 1946   Admit date: 10/02/2017 Date of Consult: 10/02/2017  Primary Care Provider: [name], MD Primary Cardiologist: Dr. Acie Fredrickson  Patient Profile    Travis [name] is a 71 y.o. 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 Daleen Bo.   History of Present Illness    Travis [name] was last examined by Dr. Acie Fredrickson in 06/2017 and reported mild swelling and lower extremity edema at that time. His baseline diuretic dosing was 20mg  BID but he had increased this to 40mg  BID for 3 days with return to his normal weight and resolution of his edema. Weight was 292 lbs at that time. Labs were rechecked and showed a stable creatinine of 1.57, therefore he was continued on his current medication regimen.   He presented to [facility] ED on 10/02/2017 for new onset fatigue and fever (up to 103 by his report this AM), with oxygen saturations initially in the 80's. The patient reports he had been in his usual state of health until this morning when he developed acute nausea, fever, and oxygen saturations in the 70's. His wife placed him back on his CPAP with improvement in his saturations.   He has chronic lower extremity edema but notes weights have been stable at 290 - 294 lbs on his home scales. Does take additional Torsemide as needed for weight gain. He denies any recent chest pain, palpitations, lightheadedness, dizziness, or presyncope. Reports he is not overly active at baseline and has difficulty walking due to spinal stenosis. Is undergoing workup for this with recent CT on 10/4 showing posterior decompression and retrolisthesis with likely neural compression.    Initial labs show WBC of 14.6, Hgb 12.2, platelets 144, K+ 4.1, creatinine 1.89  (up from 1.57 in 06/2017). D-dimer 0.82. Lactic Acid 1.10. BNP 169. Influenza panel negative. UA consistent with UTI. CXR showed aortic atherosclerosis with bibasilar atelectasis with no edema or consolidation. CTA diminished secondary to suboptimal pulmonary arterial opacification and respiratory motion artifact but no evidence of PE.[name] Possible PA hypertension and bilateral lower lobe subsegmental atelectasis noted with coronary artery calcifications involving the LAD, left circumflex and RCA.  Breathing was initially at baseline per his report but acutely declined after having received 500 mL NS. His CXR and initial labs were obtained prior to this. He was just administered IV Lasix and reports improvement in his symptoms. Still on 4L [place].   Past Medical History   Past Medical History:  Diagnosis Date  . Cervical myelopathy (HCC)    with myelomalacia at C5-6 and C3-4  . CHF (congestive heart failure) ([place])   . Constipation - functional    controlled with miralax  . DJD (degenerative joint disease)    WHOLE SPINE  . Dyspnea   . Gait disorder   . Hypertension   . Hyperthyroidism    Had Iodine to resolve issue DX 10 YR AGO  . Hypothyroidism    Low d/t treatment of hyperthyroidism  . 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 Peripheral neuropathy   . Pneumonia   . PONV (postoperative nausea and vomiting)   . RBBB   . Renal disorder    acute kidney failure  . Restless legs syndrome 03/14/2015  . RLS (  restless legs syndrome)   . Sleep apnea    DX  2009/2010 [facility] done at [facility]  . 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 Type II diabetes mellitus (HCC)    diet controlled     Allergies:   Allergies  Allergen Reactions  . Other Other (See Comments)    Anesthesia makes sick, must have antinausea medication in advance.  . Levofloxacin In D5w Itching    Possible infusion reaction 03/29/17. Tolerating oral Levaquin 03/30/17    Home Medications:   Home Medications:  Prior to Admission  medications   Medication Sig Start Date End Date Taking? Authorizing Provider  aspirin EC 81 MG EC tablet Take 1 tablet (81 mg total) by mouth daily. 08/23/15   Thurnell Lose, MD  baclofen (LIORESAL) 10 MG tablet Take 1 tablet (10 mg total) by mouth 3 (three) times daily. For muscle spasms 10/30/14   Meredith Staggers, MD  escitalopram (LEXAPRO) 10 MG tablet Take 10 mg by mouth daily. 07/12/15   [provider]  gabapentin (NEURONTIN) 400 MG capsule TAKE 1 CAPSULE (400 MG TOTAL) BY MOUTH EVERY 8 (EIGHT) HOURS. 04/16/15   Meredith Staggers, MD  glimepiride (AMARYL) 2 MG tablet Take 2 mg by mouth daily with breakfast.    [provider]  hydrALAZINE (APRESOLINE) 25 MG tablet Take 1 tablet (25 mg total) by mouth every 8 (eight) hours. 08/23/15   Thurnell Lose, MD  levothyroxine (SYNTHROID, LEVOTHROID) 175 MCG tablet Take 175 mcg by mouth daily. 08/12/15   [provider]  losartan (COZAAR) 50 MG tablet TAKE 1 TABLET BY MOUTH ONCE DAILY. 05/06/17   Nahser, Wonda Cheng, MD  polyethylene glycol (MIRALAX / GLYCOLAX) packet Take 17 g by mouth daily as needed. For constipation Patient taking differently: Take 17 g by mouth daily as needed for moderate constipation.  08/09/14   Love, Ivan Anchors, [name]  potassium chloride (KLOR-CON 10) 10 MEQ tablet Take 1 tablet (10 mEq total) by mouth 2 (two) times daily. 09/25/15   Nahser, Wonda Cheng, MD  pramipexole (MIRAPEX) 0.5 MG tablet Take 0.5 mg by mouth 2 (two) times daily.  08/09/14   Love, Ivan Anchors, [name]  tamsulosin (FLOMAX) 0.4 MG CAPS capsule Take 0.4 mg by mouth daily.  09/13/14   [provider]  torsemide (DEMADEX) 20 MG tablet Take 1 tablet (20 mg total) by mouth 2 (two) times daily. Take additional 10 mg as needed for weight gain or shortness of breath. 02/18/17   Bhagat, Bhavinkumar, PA  TOVIAZ 8 MG TB24 tablet Take 8 mg by mouth daily. 08/07/15   [provider]    Inpatient Medications    Scheduled Meds:  Continuous  Infusions:  PRN Meds:   Family History    Family History  Problem Relation Age of Onset  . Polycythemia Mother   . Diabetes Father   . Hypertension Father   . Heart disease Father   . CAD Unknown        1 of his 4 brothers and father has CAD    Social History    Social History   Social History  . Marital status: Married    Spouse name: carolyn  . Number of children: 3  . Years of education: 44   Occupational History  .  [employer]    Works in [place] History Main Topics  . Smoking status: Former Smoker    Packs/day: 2.00  Years: 20.00    Types: Cigarettes    Quit date: 06/28/1985  . Smokeless tobacco: Former Systems developer    Quit date: 06/28/1985  . Alcohol use No  . Drug use: No  . Sexual activity: Not on file   Other Topics Concern  . Not on file   Social History Narrative   Lives with wife   Patient is married with 3 children.   Patient has 16 yrs of education.   Patient is right handed.   Patient drinks 2 cups of caffeine per day.        Review of Systems    General:  No chills, night sweats or weight changes. Positive for fever. Cardiovascular:  No chest pain, edema, orthopnea, palpitations, paroxysmal nocturnal dyspnea. Positive for dyspnea on exertion.  Dermatological: No rash, lesions/masses Respiratory: No cough, Positive for dyspnea. Urologic: No hematuria, dysuria Abdominal:   No nausea, vomiting, diarrhea, bright red blood per rectum, melena, or hematemesis Neurologic:  No visual changes, wkns, changes in mental status. All other systems reviewed and are otherwise negative except as noted above.  Physical Exam/Data    Blood pressure (!) 160/68, pulse 85, temperature (!) 100.8 F (38.2 C), temperature source Oral, resp. rate 20, height 6\' 1"  (1.854 m), weight 290 lb (131.5 kg), SpO2 99 %.  General: Pleasant, obese Caucasian male appearing in NAD Psych: Normal affect. Neuro: Alert and oriented X 3. Moves all  extremities spontaneously. HEENT: Normal  Neck: Supple without bruits. JVD difficult to assess secondary to body habitus. Lungs:  Resp regular and unlabored, mild rales along right base. Heart: RRR no s3, s4, or murmurs. Abdomen: Soft, non-tender, non-distended, BS + x 4.  Extremities: No clubbing or cyanosis. 1+ pitting edema up to knees bilaterally. DP/PT/Radials 2+ and equal bilaterally.   EKG:  The EKG was personally reviewed and demonstrates:  NSR, HR 75, with known RBBB.    Labs/Studies     Relevant CV Studies:  Cardiac Catheterization: 08/2015  Prox RCA to Mid RCA lesion, 40% stenosed.  Ost Cx to Prox Cx lesion, 50% stenosed.  LM lesion, 40% stenosed.  The left ventricular systolic function is normal.  Dist LAD lesion, 40% stenosed.   1. Nonobstructive CAD 2. Normal LV function. 3. Mild pulmonary HTN with normal LV filling pressures.   Plan: medical therapy.  Echocardiogram: 02/2017 Study Conclusions  - Left ventricle: The cavity size was normal. Wall thickness was   increased in a pattern of mild LVH. Systolic function was   vigorous. The estimated ejection fraction was in the range of 65%   to 70%. Wall motion was normal; there were no regional wall   motion abnormalities. Doppler parameters are consistent with   pseudonormal left ventricular relaxation (grade 2 diastolic   dysfunction). The E/A ratio is >1.5. The E/e&' ratio is >15,   suggesting elevated LV filling pressure. - Left atrium: The atrium was mildly dilated. - Inferior vena cava: The vessel was normal in size. The   respirophasic diameter changes were in the normal range (>= 50%),   consistent with normal central venous pressure.  Impressions:  - Compared to a prior study in 2016, there is no significant   change. LV filling pressure is elevated.   Laboratory Data:  Chemistry  Recent Labs Lab 10/02/17 1005  NA 139  K 4.1  CL 104  CO2 29  GLUCOSE 200*  BUN 34*  CREATININE  1.89*  CALCIUM 8.8*  GFRNONAA 34*  GFRAA 39*  ANIONGAP 6  Recent Labs Lab 10/02/17 1005  PROT 6.9  ALBUMIN 3.7  AST 25  ALT 19  ALKPHOS 75  BILITOT 1.0   Hematology  Recent Labs Lab 10/02/17 1005  WBC 14.6*  RBC 4.07*  HGB 12.2*  HCT 38.3*  MCV 94.1  MCH 30.0  MCHC 31.9  RDW 16.1*  PLT 144*   Cardiac EnzymesNo results for input(s): TROPONINI in the last 168 hours. No results for input(s): TROPIPOC in the last 168 hours.  BNP  Recent Labs Lab 10/02/17 0944  BNP 169.8*    DDimer   Recent Labs Lab 10/02/17 1008  DDIMER 0.82*    Radiology/Studies:  Ct Angio Chest Pe W/cm &/or Wo Cm  Result Date: 10/02/2017 CLINICAL DATA:  Suspect pulmonary embolus. Intermediate probability. EXAM: CT ANGIOGRAPHY CHEST WITH CONTRAST TECHNIQUE: Multidetector CT imaging of the chest was performed using the standard protocol during bolus administration of intravenous contrast. Multiplanar CT image reconstructions and MIPs were obtained to evaluate the vascular anatomy. CONTRAST:  80 cc of Isovue 370 COMPARISON:  None FINDINGS: Cardiovascular: Exam detail is diminished secondary to suboptimal pulmonary arterial opacification and respiratory motion artifact. The main pulmonary artery appears patent. The main pulmonary artery measures 4.1 cm in diameter suggesting PA hypertension. No saddle embolus. No proximal lobar pulmonary artery filling defects identified. Beyond the level of the proximal pulmonary arteries there is significantly diminished pulmonary artery detail limiting evaluation. Normal heart size. Aortic atherosclerosis. Calcification in the LAD and left circumflex and RCA coronary artery is noted. Mediastinum/Nodes: The trachea appears patent and is midline. Normal appearance of the esophagus. No mediastinal or hilar adenopathy. No axillary or supraclavicular adenopathy. Lungs/Pleura: No pleural effusion identified. Subsegmental atelectasis involving the right lower lobe and left  base identified. There is asymmetric elevation of the right hemidiaphragm. Upper Abdomen: No acute abnormality. Musculoskeletal: Previous pedicle screw and rod fixation of the lower thoracic spine. Review of the MIP images confirms the above findings. IMPRESSION: 1. Exam detail diminished secondary to suboptimal pulmonary arterial opacification and respiratory motion artifact. No saddle embolus or proximal lobar pulmonary artery filling defects identified. 2. Increase caliber of the main pulmonary artery suggestive of PA hypertension. 3. Bilateral lower lobe subsegmental atelectasis and asymmetric elevation of the right hemidiaphragm. 4. Aortic Atherosclerosis (ICD10-I70.0). Coronary artery calcifications noted involving the LAD, left circumflex and RCA. Electronically Signed   By: Kerby Moors M.D.   On: 10/02/2017 14:26   Ct Lumbar Spine Wo Contrast  Result Date: 10/01/2017 CLINICAL DATA:  Chronic low back pain. Unsteady gait. Previous spinal fusion. EXAM: CT LUMBAR SPINE WITHOUT CONTRAST TECHNIQUE: Multidetector CT imaging of the lumbar spine was performed without intravenous contrast administration. Multiplanar CT image reconstructions were also generated. COMPARISON:  MRI 07/05/2012. FINDINGS: Segmentation: 5 lumbar type vertebral bodies. Alignment: Straightening of the normal lumbar lordosis with slight kyphotic curvature. 2 mm retrolisthesis L2-3. 3 mm retrolisthesis L3-4. 4 mm retrolisthesis L4-5. Vertebrae: Chronic mild endplate deformity at the L2-3 level. No other focal finding or evidence of fracture. Paraspinal and other soft tissues: Aortic atherosclerosis. No aneurysm. Disc levels: T12-L1: Some disc calcification. Mild annular bulging. Mild facet arthritis. No compressive stenosis suspected. L1-2: Mild bulging of the disc. Mild facet and ligamentous hypertrophy. Mild multifactorial stenosis without visible neural compression. L2-3: Previous posterior decompression. 2 mm retrolisthesis. Chronic  disc degeneration with complete loss of disc height and vacuum phenomenon. Circumferential endplate osteophytes. Mild facet hypertrophy. Stenosis of the lateral recesses and neural foramina on each side. L3-4: Previous posterior decompression. 3  mm retrolisthesis. Chronic disc degeneration with complete loss of disc height and vacuum phenomenon. Endplate osteophytes. Stenosis of both lateral recesses and neural foramina. L4-5: Previous posterior decompression. 4 mm retrolisthesis. Disc degeneration with mild disc space narrowing and vacuum phenomenon. Endplate osteophytes and circumferential protrusion of the disc. Facet and ligamentous hypertrophy. Probable severe stenosis immediately below the decompression. Stenosis also affects the lateral recesses and neural foramina. L5-S1: Endplate osteophytes and shallow protrusion of the disc. No compressive stenosis of the central canal. Mild bilateral foraminal narrowing without definite compression of the exiting L5 nerves. IMPRESSION: Posterior decompression at L2-3, L3-4 and L4-5. Retrolisthesis at all those levels. Lateral recess and foraminal narrowing at L2-3 and L3-4 that could cause neural compression. Likely severe multifactorial spinal stenosis at and just below the L4-5 level. Lateral recess and foraminal stenosis as well. Neural compression seems likely at this level. L5-S1 endplate osteophytes and disc protrusion without apparent compressive stenosis. Mild foraminal narrowing. Electronically Signed   By: Nelson Chimes M.D.   On: 10/01/2017 11:47   Dg Chest Port 1 View  Result Date: 10/02/2017 CLINICAL DATA:  Shortness of breath and hypertension EXAM: PORTABLE CHEST 1 VIEW COMPARISON:  March 29, 2017 FINDINGS: There is bibasilar atelectatic change. These no edema or consolidation. Heart size in and pulmonary vascularity are normal. No adenopathy. There is postoperative change in the lower cervical and lower thoracic spine. There is aortic atherosclerosis.  IMPRESSION: Bibasilar atelectasis. No edema or consolidation. Stable cardiac silhouette. There is aortic atherosclerosis. Aortic Atherosclerosis (ICD10-I70.0). Electronically Signed   By: Lowella Grip III M.D.   On: 10/02/2017 09:55    Assessment & Plan    1. Acute on Chronic Diastolic CHF - presented with new onset fatigue, fever, and hypoxia (O2 saturations in the 80's by his report). Has chronic edema but reports weights have been stable on his home scales (baseline 290 - 294 lbs). Diagnosed with UTI while in the ED and given IVF due to concern for sepsis. After receiving 500 mL, his breathing worsened and he had now been given IV Lasix 40mg . BNP which was minimally elevated at 169 was obtained prior to administration of IVF.  - he is on Torsemide 20mg  BID as an outpatient. Started on IV Lasix 40mg  BID this admission and would continue with IV diuresis at this time. Monitor I&O's along with daily weights. Follow creatinine closely in the setting of AKI.   2. HTN - BP at 111/52 - 169/77 while in the ED.  - continue PTA Hydralazine 25mg  TID. Losartan held in the setting of AKI.   3. CAD - nonobstructive CAD by cath in 2016. - he denies any recent anginal symptoms. EKG shows known RBBB and no acute ischemic changes. No further evaluation indicated at this time.   4. Acute on Chronic Stage 3 CKD - creatinine 1.89 on admission (up from 1.57 in 06/2017).  - continue to follow with daily BMET while receiving IV Lasix. PTA Losartan has been held.   5. OSA - continue CPAP.   6. UTI - UA consistent with UTI. Has been started on Rocephin.  - per admitting team.   7. Spinal Stenosis - recent CT on 10/4 showing posterior decompression and retrolisthesis with likely neural compression.  Scheduled to see [facility] as an outpatient. Recommend PT consult prior to discharge.    Signed, Erma Heritage, [name] 10/02/2017, 4:26 PM Pager: [phone]  Patient examined  chart reviewed. Discussed care with patient , wife and PA  Daughter in law [name] is nurse on [place] Obese white male with chronic right sided CHF and diastolic function on home oxygen with exacerbation precipitated by UTI with fever, fatigue and dyspnea. Made worse in ER by fluid bolus and contrast bolus for CTA to r/o PE. No primary cardiac issue here. Has chronic LE edema plus 3 with CRF Lungs with basilar crackles no murmur abdomen soft. Keep on Bid lasix iv and keep I/O's negative Rx infection and use oxygento keep sats above 90%  Jenkins Rouge

## 2017-10-02 NOTE — ED Notes (Signed)
Condom catheter fell off of pt, urinal placed in reach.

## 2017-10-03 DIAGNOSIS — J9601 Acute respiratory failure with hypoxia: Secondary | ICD-10-CM

## 2017-10-03 DIAGNOSIS — N179 Acute kidney failure, unspecified: Secondary | ICD-10-CM

## 2017-10-03 LAB — CBC
HEMATOCRIT: 36.9 % — AB (ref 39.0–52.0)
HEMOGLOBIN: 11.3 g/dL — AB (ref 13.0–17.0)
MCH: 29.6 pg (ref 26.0–34.0)
MCHC: 30.6 g/dL (ref 30.0–36.0)
MCV: 96.6 fL (ref 78.0–100.0)
Platelets: 137 10*3/uL — ABNORMAL LOW (ref 150–400)
RBC: 3.82 MIL/uL — ABNORMAL LOW (ref 4.22–5.81)
RDW: 16.5 % — ABNORMAL HIGH (ref 11.5–15.5)
WBC: 13.1 10*3/uL — ABNORMAL HIGH (ref 4.0–10.5)

## 2017-10-03 LAB — BASIC METABOLIC PANEL
ANION GAP: 9 (ref 5–15)
BUN: 28 mg/dL — ABNORMAL HIGH (ref 6–20)
CALCIUM: 8.5 mg/dL — AB (ref 8.9–10.3)
CHLORIDE: 100 mmol/L — AB (ref 101–111)
CO2: 32 mmol/L (ref 22–32)
Creatinine, Ser: 1.52 mg/dL — ABNORMAL HIGH (ref 0.61–1.24)
GFR calc Af Amer: 51 mL/min — ABNORMAL LOW (ref >60)
GFR calc non Af Amer: 44 mL/min — ABNORMAL LOW (ref >60)
GLUCOSE: 169 mg/dL — AB (ref 65–99)
Potassium: 3.6 mmol/L (ref 3.5–5.1)
Sodium: 141 mmol/L (ref 135–145)

## 2017-10-03 LAB — GLUCOSE, CAPILLARY
GLUCOSE-CAPILLARY: 126 mg/dL — AB (ref 65–99)
GLUCOSE-CAPILLARY: 209 mg/dL — AB (ref 65–99)
GLUCOSE-CAPILLARY: 209 mg/dL — AB (ref 65–99)
Glucose-Capillary: 160 mg/dL — ABNORMAL HIGH (ref 65–99)
Glucose-Capillary: 161 mg/dL — ABNORMAL HIGH (ref 65–99)

## 2017-10-03 MED ORDER — FUROSEMIDE 10 MG/ML IJ SOLN
80.0000 mg | Freq: Two times a day (BID) | INTRAMUSCULAR | Status: AC
  Administered 2017-10-03 (×2): 80 mg via INTRAVENOUS
  Filled 2017-10-03 (×2): qty 8

## 2017-10-03 MED ORDER — POTASSIUM CHLORIDE CRYS ER 20 MEQ PO TBCR
40.0000 meq | EXTENDED_RELEASE_TABLET | Freq: Once | ORAL | Status: AC
  Administered 2017-10-03: 40 meq via ORAL
  Filled 2017-10-03: qty 2

## 2017-10-03 MED ORDER — HEPARIN SODIUM (PORCINE) 5000 UNIT/ML IJ SOLN
5000.0000 [IU] | Freq: Three times a day (TID) | INTRAMUSCULAR | Status: DC
  Administered 2017-10-03 – 2017-10-08 (×15): 5000 [IU] via SUBCUTANEOUS
  Filled 2017-10-03 (×15): qty 1

## 2017-10-03 NOTE — Progress Notes (Signed)
Progress Note  Patient Name: Travis [name] Date of Encounter: 10/03/2017  Primary Cardiologist: Dr. Acie Fredrickson  Subjective   Breathing OK but not at baseline.    Inpatient Medications    Scheduled Meds: . aspirin EC  81 mg Oral Daily  . azithromycin  500 mg Oral Daily   Followed by  . azithromycin  250 mg Oral Daily  . escitalopram  10 mg Oral Daily  . fesoterodine  8 mg Oral Daily  . furosemide  40 mg Intravenous BID  . gabapentin  400 mg Oral TID  . hydrALAZINE  25 mg Oral Q8H  . insulin aspart  0-9 Units Subcutaneous TID WC  . levothyroxine  175 mcg Oral QAC breakfast  . potassium chloride  10 mEq Oral BID  . pramipexole  0.5 mg Oral BID  . tamsulosin  0.4 mg Oral Daily   Continuous Infusions: . cefTRIAXone (ROCEPHIN)  IV     PRN Meds: acetaminophen, baclofen, polyethylene glycol   Vital Signs    Vitals:   10/02/17 2021 10/02/17 2045 10/03/17 0026 10/03/17 0409  BP:   (!) 147/68 137/63  Pulse:   73 68  Resp:   19 18  Temp: (!) 100.8 F (38.2 C)  99.3 F (37.4 C) 99.3 F (37.4 C)  TempSrc: Oral  Oral Oral  SpO2:   92% 94%  Weight:  288 lb 9.3 oz (130.9 kg)  288 lb 9.3 oz (130.9 kg)  Height:  6\' 1"  (1.854 m)      Intake/Output Summary (Last 24 hours) at 10/03/17 0736 Last data filed at 10/03/17 0413  Gross per 24 hour  Intake                0 ml  Output             1000 ml  Net            -1000 ml   Filed Weights   10/02/17 1013 10/02/17 2045 10/03/17 0409  Weight: 290 lb (131.5 kg) 288 lb 9.3 oz (130.9 kg) 288 lb 9.3 oz (130.9 kg)    Telemetry    NSR - Personally Reviewed  ECG    NA - Personally Reviewed  Physical Exam   GEN: No acute distress.   Neck: Positive  JVD Cardiac: RRR, no murmurs, rubs, or gallops.  Respiratory: Clear  to auscultation bilaterally. GI: Soft, nontender, non-distended  MS: No moderate leg edema; No deformity. Neuro:  Nonfocal  Psych: Normal affect   Labs    Chemistry Recent Labs Lab 10/02/17 1005  10/03/17 0542  NA 139 141  K 4.1 3.6  CL 104 100*  CO2 29 32  GLUCOSE 200* 169*  BUN 34* 28*  CREATININE 1.89* 1.52*  CALCIUM 8.8* 8.5*  PROT 6.9  --   ALBUMIN 3.7  --   AST 25  --   ALT 19  --   ALKPHOS 75  --   BILITOT 1.0  --   GFRNONAA 34* 44*  GFRAA 39* 51*  ANIONGAP 6 9     Hematology Recent Labs Lab 10/02/17 1005 10/03/17 0542  WBC 14.6* 13.1*  RBC 4.07* 3.82*  HGB 12.2* 11.3*  HCT 38.3* 36.9*  MCV 94.1 96.6  MCH 30.0 29.6  MCHC 31.9 30.6  RDW 16.1* 16.5*  PLT 144* 137*    Cardiac EnzymesNo results for input(s): TROPONINI in the last 168 hours. No results for input(s): TROPIPOC in the last 168 hours.   BNP Recent  Labs Lab 10/02/17 0944  BNP 169.8*     DDimer  Recent Labs Lab 10/02/17 1008  DDIMER 0.82*     Radiology    Ct Angio Chest Pe W/cm &/or Wo Cm  Result Date: 10/02/2017 CLINICAL DATA:  Suspect pulmonary embolus. Intermediate probability. EXAM: CT ANGIOGRAPHY CHEST WITH CONTRAST TECHNIQUE: Multidetector CT imaging of the chest was performed using the standard protocol during bolus administration of intravenous contrast. Multiplanar CT image reconstructions and MIPs were obtained to evaluate the vascular anatomy. CONTRAST:  80 cc of Isovue 370 COMPARISON:  None FINDINGS: Cardiovascular: Exam detail is diminished secondary to suboptimal pulmonary arterial opacification and respiratory motion artifact. The main pulmonary artery appears patent. The main pulmonary artery measures 4.1 cm in diameter suggesting PA hypertension. No saddle embolus. No proximal lobar pulmonary artery filling defects identified. Beyond the level of the proximal pulmonary arteries there is significantly diminished pulmonary artery detail limiting evaluation. Normal heart size. Aortic atherosclerosis. Calcification in the LAD and left circumflex and RCA coronary artery is noted. Mediastinum/Nodes: The trachea appears patent and is midline. Normal appearance of the esophagus.  No mediastinal or hilar adenopathy. No axillary or supraclavicular adenopathy. Lungs/Pleura: No pleural effusion identified. Subsegmental atelectasis involving the right lower lobe and left base identified. There is asymmetric elevation of the right hemidiaphragm. Upper Abdomen: No acute abnormality. Musculoskeletal: Previous pedicle screw and rod fixation of the lower thoracic spine. Review of the MIP images confirms the above findings. IMPRESSION: 1. Exam detail diminished secondary to suboptimal pulmonary arterial opacification and respiratory motion artifact. No saddle embolus or proximal lobar pulmonary artery filling defects identified. 2. Increase caliber of the main pulmonary artery suggestive of PA hypertension. 3. Bilateral lower lobe subsegmental atelectasis and asymmetric elevation of the right hemidiaphragm. 4. Aortic Atherosclerosis (ICD10-I70.0). Coronary artery calcifications noted involving the LAD, left circumflex and RCA. Electronically Signed   By: Kerby Moors M.D.   On: 10/02/2017 14:26   Ct Lumbar Spine Wo Contrast  Result Date: 10/01/2017 CLINICAL DATA:  Chronic low back pain. Unsteady gait. Previous spinal fusion. EXAM: CT LUMBAR SPINE WITHOUT CONTRAST TECHNIQUE: Multidetector CT imaging of the lumbar spine was performed without intravenous contrast administration. Multiplanar CT image reconstructions were also generated. COMPARISON:  MRI 07/05/2012. FINDINGS: Segmentation: 5 lumbar type vertebral bodies. Alignment: Straightening of the normal lumbar lordosis with slight kyphotic curvature. 2 mm retrolisthesis L2-3. 3 mm retrolisthesis L3-4. 4 mm retrolisthesis L4-5. Vertebrae: Chronic mild endplate deformity at the L2-3 level. No other focal finding or evidence of fracture. Paraspinal and other soft tissues: Aortic atherosclerosis. No aneurysm. Disc levels: T12-L1: Some disc calcification. Mild annular bulging. Mild facet arthritis. No compressive stenosis suspected. L1-2: Mild  bulging of the disc. Mild facet and ligamentous hypertrophy. Mild multifactorial stenosis without visible neural compression. L2-3: Previous posterior decompression. 2 mm retrolisthesis. Chronic disc degeneration with complete loss of disc height and vacuum phenomenon. Circumferential endplate osteophytes. Mild facet hypertrophy. Stenosis of the lateral recesses and neural foramina on each side. L3-4: Previous posterior decompression. 3 mm retrolisthesis. Chronic disc degeneration with complete loss of disc height and vacuum phenomenon. Endplate osteophytes. Stenosis of both lateral recesses and neural foramina. L4-5: Previous posterior decompression. 4 mm retrolisthesis. Disc degeneration with mild disc space narrowing and vacuum phenomenon. Endplate osteophytes and circumferential protrusion of the disc. Facet and ligamentous hypertrophy. Probable severe stenosis immediately below the decompression. Stenosis also affects the lateral recesses and neural foramina. L5-S1: Endplate osteophytes and shallow protrusion of the disc. No compressive stenosis of the  central canal. Mild bilateral foraminal narrowing without definite compression of the exiting L5 nerves. IMPRESSION: Posterior decompression at L2-3, L3-4 and L4-5. Retrolisthesis at all those levels. Lateral recess and foraminal narrowing at L2-3 and L3-4 that could cause neural compression. Likely severe multifactorial spinal stenosis at and just below the L4-5 level. Lateral recess and foraminal stenosis as well. Neural compression seems likely at this level. L5-S1 endplate osteophytes and disc protrusion without apparent compressive stenosis. Mild foraminal narrowing. Electronically Signed   By: Nelson Chimes M.D.   On: 10/01/2017 11:47   US Renal  Result Date: 10/02/2017 CLINICAL DATA:  71 year old male with history of acute kidney injury. EXAM: RENAL / URINARY TRACT ULTRASOUND COMPLETE COMPARISON:  Abdominal ultrasound 07/14/2017. FINDINGS: Right Kidney:  Length: 10.3 cm. Echogenicity within normal limits. In the lower pole of the left kidney there is a 2.2 x 2.0 x 2.0 cm hypoechoic lesion. No definite increase through transmission appreciated associated with this lesion. No definite internal blood flow. No hydronephrosis visualized. Left Kidney: Length: 10.2 cm. Echogenicity within normal limits. No mass or hydronephrosis visualized. Bladder: Completely decompressed with Foley balloon catheter in place. IMPRESSION: 1. No acute findings.  Specifically, no hydroureteronephrosis. 2. Indeterminate lesion in the lower pole of the left kidney measuring 2.2 x 2.0 x 2.0 cm. This is new compared to the prior abdominal ultrasound 07/14/2014. Further characterization with nonemergent MRI of the abdomen with and without IV gadolinium should be considered in the near future to exclude underlying neoplasm. Electronically Signed   By: Vinnie Langton M.D.   On: 10/02/2017 17:42   Dg Chest Port 1 View  Result Date: 10/02/2017 CLINICAL DATA:  Shortness of breath and hypertension EXAM: PORTABLE CHEST 1 VIEW COMPARISON:  March 29, 2017 FINDINGS: There is bibasilar atelectatic change. These no edema or consolidation. Heart size in and pulmonary vascularity are normal. No adenopathy. There is postoperative change in the lower cervical and lower thoracic spine. There is aortic atherosclerosis. IMPRESSION: Bibasilar atelectasis. No edema or consolidation. Stable cardiac silhouette. There is aortic atherosclerosis. Aortic Atherosclerosis (ICD10-I70.0). Electronically Signed   By: Lowella Grip III M.D.   On: 10/02/2017 09:55    Cardiac Studies   NA  Patient Profile     71 y.o. 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   Assessment & Plan    ACUTE ON CHRONIC DIASTOLIC HF:   Down  one liter.  Documentation appears to be incomplete.  Wt is unchanged.   He still has significant leg edema.  Needs to keep his legs up.   Increase Lasix today and reassess in the AM.  Give extra potassium today.   ACUTE ON CHRONIC CKD III:   Creat is stable.  Follow closely.    CAD:  Nonobstructive.  No evidence of acute coronary syndrome.    Signed, Minus Breeding, MD  10/03/2017, 7:36 AM

## 2017-10-03 NOTE — Progress Notes (Signed)
PROGRESS NOTE [facility]   IRIS TATSCH   NOB:[id number] DOB: [birth year]  DOA: 10/02/2017 PCP: [name], MD   Brief Narrative:  Travis [name] is a 71 year old male with past medical history of diastolic heart failure, hypertension, chronic kidney disease, diabetes. Patient presented to the emergency department after having a fever of 103 followed by an episode of nausea and vomiting. Other symptoms were shortness of breath and dyspnea with progressive bilateral edema. Upon ED evaluation patient was found to have signs of fluid overload chest x-ray, UA grossly abnormal Patient was admitted with working diagnosis of UTI and acute exacerbation of diastolic CHF. Cardiology was consulted and patient was started on IV Lasix. Patient was also started on IV antibiotics for UTI.  Subjective: Patient seen and examined, with daughter at bedside. Patient feels significantly better. Breathing is better but no at baseline. Leg edema improving. No acute events overnight.   Assessment & Plan: UTI - > 100,000 Citrobacter  Sensitivities pending  Continue rocephin for now  Blood cultures no growth to date  WBC improving  Continue to monitor  Acute on chronic diastolic CHF  Exacerbated by infectious process and aggressive hydration Cardiology was consulted - appreciated  Remains in IV lasix, dose increase today  Monitor daily weight and I&Os Ted Hose   Acute respiratory failure with hypoxia due to volume overload  Responded well to IV lasix and O2  Patient was started on azithromycin felt to have early PNA will d/c no PNA on CXR.  Patient off O2 at this time  Check O2 with ambulation   Acute renal failure on CKD stage III Baseline Cr 1.3-1.5 Monitor while on aggressive diuresis  Renal US no acute process although shows hypoechoic lesion od 2 cm - need MRI as outpatient   OSA  CPAP at night   HTN  BP stable  Continue current regimen  Deconditioning PT consulted     DVT prophylaxis: Heparin Sq Code Status: Full Code Family Communication: Daughter's at bedside  Disposition Plan: Home in 1-3 days   Consultants:   Cardiology   Procedures:   None   Antimicrobials: Anti-infectives    Start     Dose/Rate Route Frequency Ordered Stop   10/03/17 1300  cefTRIAXone (ROCEPHIN) 1 g in dextrose 5 % 50 mL IVPB     1 g 100 mL/hr over 30 Minutes Intravenous Every 24 hours 10/02/17 1549     10/03/17 1000  azithromycin (ZITHROMAX) tablet 250 mg  Status:  Discontinued     250 mg Oral Daily 10/02/17 1551 10/03/17 0928   10/02/17 1600  azithromycin (ZITHROMAX) tablet 500 mg     500 mg Oral Daily 10/02/17 1551 10/03/17 0959   10/02/17 1300  cefTRIAXone (ROCEPHIN) 1 g in dextrose 5 % 50 mL IVPB     1 g 100 mL/hr over 30 Minutes Intravenous  Once 10/02/17 1254 10/02/17 1443      Objective: Vitals:   10/02/17 2045 10/03/17 0026 10/03/17 0409 10/03/17 1351  BP:  (!) 147/68 137/63 (!) 138/59  Pulse:  73 68 67  Resp:  19 18 20   Temp:  99.3 F (37.4 C) 99.3 F (37.4 C) 98.6 F (37 C)  TempSrc:  Oral Oral Oral  SpO2:  92% 94% 98%  Weight: 130.9 kg (288 lb 9.3 oz)  130.9 kg (288 lb 9.3 oz)   Height: 6\' 1"  (1.854 m)       Intake/Output Summary (Last 24 hours) at 10/03/17 1551 Last data filed  at 10/03/17 0413  Gross per 24 hour  Intake                0 ml  Output             1000 ml  Net            -1000 ml   Filed Weights   10/02/17 1013 10/02/17 2045 10/03/17 0409  Weight: 131.5 kg (290 lb) 130.9 kg (288 lb 9.3 oz) 130.9 kg (288 lb 9.3 oz)    Examination:  General exam: Appears calm and comfortable  HEENT: OP moist and clear Respiratory system: Decrease air entry b/l, mild crackles at the bases  Cardiovascular system: S1 & S2 heard, RRR. No JVD, murmurs, rubs or gallops Gastrointestinal system: Abdomen is nondistended, soft and nontender.  Central nervous system: Alert and oriented. No focal neurological deficits. Extremities: LE edema 1+   Skin: No rashes, lesions or ulcers Psychiatry: Judgement and insight appear normal. Mood & affect appropriate.    Data Reviewed: I have personally reviewed following labs and imaging studies  CBC:  Recent Labs Lab 10/02/17 1005 10/03/17 0542  WBC 14.6* 13.1*  NEUTROABS 13.0*  --   HGB 12.2* 11.3*  HCT 38.3* 36.9*  MCV 94.1 96.6  PLT 144* 626*   Basic Metabolic Panel:  Recent Labs Lab 10/02/17 1005 10/03/17 0542  NA 139 141  K 4.1 3.6  CL 104 100*  CO2 29 32  GLUCOSE 200* 169*  BUN 34* 28*  CREATININE 1.89* 1.52*  CALCIUM 8.8* 8.5*   GFR: Estimated Creatinine Clearance: 62.3 mL/min (A) (by C-G formula based on SCr of 1.52 mg/dL (H)). Liver Function Tests:  Recent Labs Lab 10/02/17 1005  AST 25  ALT 19  ALKPHOS 75  BILITOT 1.0  PROT 6.9  ALBUMIN 3.7   No results for input(s): LIPASE, AMYLASE in the last 168 hours. No results for input(s): AMMONIA in the last 168 hours. Coagulation Profile: No results for input(s): INR, PROTIME in the last 168 hours. Cardiac Enzymes: No results for input(s): CKTOTAL, CKMB, CKMBINDEX, TROPONINI in the last 168 hours. BNP (last 3 results)  Recent Labs  02/12/17 1522  PROBNP 109   HbA1C: No results for input(s): HGBA1C in the last 72 hours. CBG:  Recent Labs Lab 10/02/17 2111 10/03/17 0048 10/03/17 0753 10/03/17 1143  GLUCAP 232* 209* 160* 209*   Lipid Profile: No results for input(s): CHOL, HDL, LDLCALC, TRIG, CHOLHDL, LDLDIRECT in the last 72 hours. Thyroid Function Tests:  Recent Labs  10/02/17 1630  TSH 0.446   Anemia Panel: No results for input(s): VITAMINB12, FOLATE, FERRITIN, TIBC, IRON, RETICCTPCT in the last 72 hours. Sepsis Labs:  Recent Labs Lab 10/02/17 1025 10/02/17 1254  LATICACIDVEN 1.10 0.81    Recent Results (from the past 240 hour(s))  Urine culture     Status: Abnormal (Preliminary result)   Collection Time: 10/02/17  9:59 AM  Result Value Ref Range Status   Specimen  Description URINE, RANDOM  Final   Special Requests NONE  Final   Culture >=100,000 COLONIES/mL CITROBACTER FREUNDII (A)  Final   Report Status PENDING  Incomplete  Blood Culture (routine x 2)     Status: None (Preliminary result)   Collection Time: 10/02/17 10:05 AM  Result Value Ref Range Status   Specimen Description BLOOD RIGHT ANTECUBITAL  Final   Special Requests   Final    BOTTLES DRAWN AEROBIC AND ANAEROBIC Blood Culture adequate volume   Culture NO GROWTH  1 DAY  Final   Report Status PENDING  Incomplete  Blood Culture (routine x 2)     Status: None (Preliminary result)   Collection Time: 10/02/17 10:20 AM  Result Value Ref Range Status   Specimen Description BLOOD LEFT HAND  Final   Special Requests   Final    BOTTLES DRAWN AEROBIC AND ANAEROBIC Blood Culture adequate volume   Culture NO GROWTH 1 DAY  Final   Report Status PENDING  Incomplete      Radiology Studies: Ct Angio Chest Pe W/cm &/or Wo Cm  Result Date: 10/02/2017 CLINICAL DATA:  Suspect pulmonary embolus. Intermediate probability. EXAM: CT ANGIOGRAPHY CHEST WITH CONTRAST TECHNIQUE: Multidetector CT imaging of the chest was performed using the standard protocol during bolus administration of intravenous contrast. Multiplanar CT image reconstructions and MIPs were obtained to evaluate the vascular anatomy. CONTRAST:  80 cc of Isovue 370 COMPARISON:  None FINDINGS: Cardiovascular: Exam detail is diminished secondary to suboptimal pulmonary arterial opacification and respiratory motion artifact. The main pulmonary artery appears patent. The main pulmonary artery measures 4.1 cm in diameter suggesting PA hypertension. No saddle embolus. No proximal lobar pulmonary artery filling defects identified. Beyond the level of the proximal pulmonary arteries there is significantly diminished pulmonary artery detail limiting evaluation. Normal heart size. Aortic atherosclerosis. Calcification in the LAD and left circumflex and RCA  coronary artery is noted. Mediastinum/Nodes: The trachea appears patent and is midline. Normal appearance of the esophagus. No mediastinal or hilar adenopathy. No axillary or supraclavicular adenopathy. Lungs/Pleura: No pleural effusion identified. Subsegmental atelectasis involving the right lower lobe and left base identified. There is asymmetric elevation of the right hemidiaphragm. Upper Abdomen: No acute abnormality. Musculoskeletal: Previous pedicle screw and rod fixation of the lower thoracic spine. Review of the MIP images confirms the above findings. IMPRESSION: 1. Exam detail diminished secondary to suboptimal pulmonary arterial opacification and respiratory motion artifact. No saddle embolus or proximal lobar pulmonary artery filling defects identified. 2. Increase caliber of the main pulmonary artery suggestive of PA hypertension. 3. Bilateral lower lobe subsegmental atelectasis and asymmetric elevation of the right hemidiaphragm. 4. Aortic Atherosclerosis (ICD10-I70.0). Coronary artery calcifications noted involving the LAD, left circumflex and RCA. Electronically Signed   By: Kerby Moors M.D.   On: 10/02/2017 14:26   US Renal  Result Date: 10/02/2017 CLINICAL DATA:  71 year old male with history of acute kidney injury. EXAM: RENAL / URINARY TRACT ULTRASOUND COMPLETE COMPARISON:  Abdominal ultrasound 07/14/2017. FINDINGS: Right Kidney: Length: 10.3 cm. Echogenicity within normal limits. In the lower pole of the left kidney there is a 2.2 x 2.0 x 2.0 cm hypoechoic lesion. No definite increase through transmission appreciated associated with this lesion. No definite internal blood flow. No hydronephrosis visualized. Left Kidney: Length: 10.2 cm. Echogenicity within normal limits. No mass or hydronephrosis visualized. Bladder: Completely decompressed with Foley balloon catheter in place. IMPRESSION: 1. No acute findings.  Specifically, no hydroureteronephrosis. 2. Indeterminate lesion in the lower  pole of the left kidney measuring 2.2 x 2.0 x 2.0 cm. This is new compared to the prior abdominal ultrasound 07/14/2014. Further characterization with nonemergent MRI of the abdomen with and without IV gadolinium should be considered in the near future to exclude underlying neoplasm. Electronically Signed   By: Vinnie Langton M.D.   On: 10/02/2017 17:42   Dg Chest Port 1 View  Result Date: 10/02/2017 CLINICAL DATA:  Shortness of breath and hypertension EXAM: PORTABLE CHEST 1 VIEW COMPARISON:  March 29, 2017 FINDINGS: There is  bibasilar atelectatic change. These no edema or consolidation. Heart size in and pulmonary vascularity are normal. No adenopathy. There is postoperative change in the lower cervical and lower thoracic spine. There is aortic atherosclerosis. IMPRESSION: Bibasilar atelectasis. No edema or consolidation. Stable cardiac silhouette. There is aortic atherosclerosis. Aortic Atherosclerosis (ICD10-I70.0). Electronically Signed   By: Lowella Grip III M.D.   On: 10/02/2017 09:55    Scheduled Meds: . aspirin EC  81 mg Oral Daily  . escitalopram  10 mg Oral Daily  . fesoterodine  8 mg Oral Daily  . furosemide  80 mg Intravenous BID  . gabapentin  400 mg Oral TID  . hydrALAZINE  25 mg Oral Q8H  . insulin aspart  0-9 Units Subcutaneous TID WC  . levothyroxine  175 mcg Oral QAC breakfast  . potassium chloride  10 mEq Oral BID  . pramipexole  0.5 mg Oral BID  . tamsulosin  0.4 mg Oral Daily   Continuous Infusions: . cefTRIAXone (ROCEPHIN)  IV Stopped (10/03/17 1241)     LOS: 1 day    Time spent: Total of 35 minutes spent with pt, greater than 50% of which was spent in discussion of  treatment, counseling and coordination of care    Chipper Oman, MD Pager: Text Page via [email or web address]   If 7PM-7AM, please contact night-coverage [email or web address] 10/03/2017, 3:51 PM

## 2017-10-04 ENCOUNTER — Encounter (HOSPITAL_COMMUNITY): Payer: Self-pay | Admitting: *Deleted

## 2017-10-04 LAB — CBC
HEMATOCRIT: 37.6 % — AB (ref 39.0–52.0)
Hemoglobin: 11.8 g/dL — ABNORMAL LOW (ref 13.0–17.0)
MCH: 30 pg (ref 26.0–34.0)
MCHC: 31.4 g/dL (ref 30.0–36.0)
MCV: 95.7 fL (ref 78.0–100.0)
PLATELETS: 147 10*3/uL — AB (ref 150–400)
RBC: 3.93 MIL/uL — ABNORMAL LOW (ref 4.22–5.81)
RDW: 16 % — AB (ref 11.5–15.5)
WBC: 9.1 10*3/uL (ref 4.0–10.5)

## 2017-10-04 LAB — URINE CULTURE

## 2017-10-04 LAB — BASIC METABOLIC PANEL
ANION GAP: 9 (ref 5–15)
BUN: 32 mg/dL — AB (ref 6–20)
CALCIUM: 8.7 mg/dL — AB (ref 8.9–10.3)
CHLORIDE: 97 mmol/L — AB (ref 101–111)
CO2: 35 mmol/L — AB (ref 22–32)
CREATININE: 1.56 mg/dL — AB (ref 0.61–1.24)
GFR calc Af Amer: 49 mL/min — ABNORMAL LOW (ref >60)
GFR, EST NON AFRICAN AMERICAN: 43 mL/min — AB (ref >60)
GLUCOSE: 142 mg/dL — AB (ref 65–99)
POTASSIUM: 4.2 mmol/L (ref 3.5–5.1)
Sodium: 141 mmol/L (ref 135–145)

## 2017-10-04 LAB — GLUCOSE, CAPILLARY
GLUCOSE-CAPILLARY: 184 mg/dL — AB (ref 65–99)
Glucose-Capillary: 163 mg/dL — ABNORMAL HIGH (ref 65–99)
Glucose-Capillary: 140 mg/dL — ABNORMAL HIGH (ref 65–99)
Glucose-Capillary: 146 mg/dL — ABNORMAL HIGH (ref 65–99)

## 2017-10-04 MED ORDER — METOLAZONE 5 MG PO TABS
2.5000 mg | ORAL_TABLET | Freq: Once | ORAL | Status: AC
  Administered 2017-10-04: 2.5 mg via ORAL
  Filled 2017-10-04: qty 1

## 2017-10-04 MED ORDER — TRAZODONE HCL 50 MG PO TABS
50.0000 mg | ORAL_TABLET | Freq: Every day | ORAL | Status: DC
  Administered 2017-10-04 – 2017-10-07 (×4): 50 mg via ORAL
  Filled 2017-10-04 (×4): qty 1

## 2017-10-04 MED ORDER — LOSARTAN POTASSIUM 50 MG PO TABS
50.0000 mg | ORAL_TABLET | Freq: Every day | ORAL | Status: DC
  Administered 2017-10-05 – 2017-10-06 (×2): 50 mg via ORAL
  Filled 2017-10-04 (×2): qty 1

## 2017-10-04 NOTE — Progress Notes (Signed)
PROGRESS NOTE [facility]   ANTONEO GHRIST   ZOX:[id number] DOB: Nov 07, 1946  DOA: 10/02/2017 PCP: [name], MD   Brief Narrative:  Travis [name] is a 71 year old male with past medical history of diastolic heart failure, hypertension, chronic kidney disease, diabetes. Patient presented to the emergency department after having a fever of 103 followed by an episode of nausea and vomiting. Other symptoms were shortness of breath and dyspnea with progressive bilateral edema. Upon ED evaluation patient was found to have signs of fluid overload chest x-ray, UA grossly abnormal Patient was admitted with working diagnosis of UTI and acute exacerbation of diastolic CHF. Cardiology was consulted and patient was started on IV Lasix. Patient was also started on IV antibiotics for UTI.  Subjective: Significantly improved, breathing better still does not feel at baseline, still requiring O2 supplementation. Leg edema improving.    Assessment & Plan: UTI - > 100,000 Citrobacter  Sensitive to Rocephin will continue for now and transition to Vantin in AM  Blood cultures no growth to date  WBC normalized  Continue to monitor  Acute on chronic diastolic CHF  Exacerbated by infectious process and aggressive hydration Cardiology was consulted - appreciated  Remains with aggressive diuresis - IV lasix, Zaroxolyn added  Negative balance, dont believe that weight is accurate.  Monitor daily weight and I&Os Ted Hose   Acute respiratory failure with hypoxia due to volume overload  - improving  Responded well to IV lasix and O2  Patient was started on azithromycin felt to have early PNA will d/c no PNA on CXR.  Wean O2 as tolerated  Check O2 with ambulation   Acute renal failure on CKD stage III - stable  Cr at baseline  Monitor while on aggressive diuresis  Renal US no acute process although shows hypoechoic lesion od 2 cm - need MRI as outpatient   OSA  CPAP at night   HTN  BP  slight above goal - will resume losartan  Continue Hydralazine  Monitor   Deconditioning Awaiting PT eval   DVT prophylaxis: Heparin Sq Code Status: Full Code Family Communication: None at bedside  Disposition Plan: Home in 1-2 days   Consultants:   Cardiology   Procedures:   None   Antimicrobials: Anti-infectives    Start     Dose/Rate Route Frequency Ordered Stop   10/03/17 1300  cefTRIAXone (ROCEPHIN) 1 g in dextrose 5 % 50 mL IVPB     1 g 100 mL/hr over 30 Minutes Intravenous Every 24 hours 10/02/17 1549     10/03/17 1000  azithromycin (ZITHROMAX) tablet 250 mg  Status:  Discontinued     250 mg Oral Daily 10/02/17 1551 10/03/17 0928   10/02/17 1600  azithromycin (ZITHROMAX) tablet 500 mg     500 mg Oral Daily 10/02/17 1551 10/03/17 0959   10/02/17 1300  cefTRIAXone (ROCEPHIN) 1 g in dextrose 5 % 50 mL IVPB     1 g 100 mL/hr over 30 Minutes Intravenous  Once 10/02/17 1254 10/02/17 1443      Objective: Vitals:   10/03/17 1951 10/03/17 2351 10/04/17 0352 10/04/17 0700  BP: (!) 156/73 (!) 174/73 (!) 144/66   Pulse: 79 89 82   Resp: 19 20 17 18   Temp: 98.9 F (37.2 C) 98 F (36.7 C) 99.8 F (37.7 C) 98.4 F (36.9 C)  TempSrc: Oral Oral Oral Oral  SpO2: 92% 91% 91%   Weight:   132.4 kg (291 lb 14.2 oz)   Height:  Intake/Output Summary (Last 24 hours) at 10/04/17 0859 Last data filed at 10/04/17 0539  Gross per 24 hour  Intake               50 ml  Output             1775 ml  Net            -1725 ml   Filed Weights   10/02/17 2045 10/03/17 0409 10/04/17 0352  Weight: 130.9 kg (288 lb 9.3 oz) 130.9 kg (288 lb 9.3 oz) 132.4 kg (291 lb 14.2 oz)    Examination:  General: Pt is alert, awake, not in acute distress Cardiovascular: RRR S1S2, no JVD  Respiratory: Decrease air entry b/l, mild crackles at the bases Abdominal: Soft, NT, ND, bowel sounds + Extremities: Ted hose in place, LE edema improving  Skin: Warm and dry   Data Reviewed: I have  personally reviewed following labs and imaging studies  CBC:  Recent Labs Lab 10/02/17 1005 10/03/17 0542 10/04/17 0411  WBC 14.6* 13.1* 9.1  NEUTROABS 13.0*  --   --   HGB 12.2* 11.3* 11.8*  HCT 38.3* 36.9* 37.6*  MCV 94.1 96.6 95.7  PLT 144* 137* 841*   Basic Metabolic Panel:  Recent Labs Lab 10/02/17 1005 10/03/17 0542 10/04/17 0411  NA 139 141 141  K 4.1 3.6 4.2  CL 104 100* 97*  CO2 29 32 35*  GLUCOSE 200* 169* 142*  BUN 34* 28* 32*  CREATININE 1.89* 1.52* 1.56*  CALCIUM 8.8* 8.5* 8.7*   GFR: Estimated Creatinine Clearance: 61.1 mL/min (A) (by C-G formula based on SCr of 1.56 mg/dL (H)). Liver Function Tests:  Recent Labs Lab 10/02/17 1005  AST 25  ALT 19  ALKPHOS 75  BILITOT 1.0  PROT 6.9  ALBUMIN 3.7   No results for input(s): LIPASE, AMYLASE in the last 168 hours. No results for input(s): AMMONIA in the last 168 hours. Coagulation Profile: No results for input(s): INR, PROTIME in the last 168 hours. Cardiac Enzymes: No results for input(s): CKTOTAL, CKMB, CKMBINDEX, TROPONINI in the last 168 hours. BNP (last 3 results)  Recent Labs  02/12/17 1522  PROBNP 109   HbA1C: No results for input(s): HGBA1C in the last 72 hours. CBG:  Recent Labs Lab 10/03/17 0753 10/03/17 1143 10/03/17 1644 10/03/17 2105 10/04/17 0818  GLUCAP 160* 209* 161* 126* 163*   Lipid Profile: No results for input(s): CHOL, HDL, LDLCALC, TRIG, CHOLHDL, LDLDIRECT in the last 72 hours. Thyroid Function Tests:  Recent Labs  10/02/17 1630  TSH 0.446   Anemia Panel: No results for input(s): VITAMINB12, FOLATE, FERRITIN, TIBC, IRON, RETICCTPCT in the last 72 hours. Sepsis Labs:  Recent Labs Lab 10/02/17 1025 10/02/17 1254  LATICACIDVEN 1.10 0.81    Recent Results (from the past 240 hour(s))  Urine culture     Status: Abnormal   Collection Time: 10/02/17  9:59 AM  Result Value Ref Range Status   Specimen Description URINE, RANDOM  Final   Special  Requests NONE  Final   Culture >=100,000 COLONIES/mL CITROBACTER FREUNDII (A)  Final   Report Status 10/04/2017 FINAL  Final   Organism ID, Bacteria CITROBACTER FREUNDII (A)  Final      Susceptibility   Citrobacter freundii - MIC*    CEFAZOLIN >=64 RESISTANT Resistant     CEFTRIAXONE <=1 SENSITIVE Sensitive     CIPROFLOXACIN <=0.25 SENSITIVE Sensitive     GENTAMICIN <=1 SENSITIVE Sensitive     IMIPENEM <=0.25  SENSITIVE Sensitive     NITROFURANTOIN <=16 SENSITIVE Sensitive     TRIMETH/SULFA <=20 SENSITIVE Sensitive     PIP/TAZO <=4 SENSITIVE Sensitive     * >=100,000 COLONIES/mL CITROBACTER FREUNDII  Blood Culture (routine x 2)     Status: None (Preliminary result)   Collection Time: 10/02/17 10:05 AM  Result Value Ref Range Status   Specimen Description BLOOD RIGHT ANTECUBITAL  Final   Special Requests   Final    BOTTLES DRAWN AEROBIC AND ANAEROBIC Blood Culture adequate volume   Culture NO GROWTH 1 DAY  Final   Report Status PENDING  Incomplete  Blood Culture (routine x 2)     Status: None (Preliminary result)   Collection Time: 10/02/17 10:20 AM  Result Value Ref Range Status   Specimen Description BLOOD LEFT HAND  Final   Special Requests   Final    BOTTLES DRAWN AEROBIC AND ANAEROBIC Blood Culture adequate volume   Culture NO GROWTH 1 DAY  Final   Report Status PENDING  Incomplete      Radiology Studies: Ct Angio Chest Pe W/cm &/or Wo Cm  Result Date: 10/02/2017 CLINICAL DATA:  Suspect pulmonary embolus. Intermediate probability. EXAM: CT ANGIOGRAPHY CHEST WITH CONTRAST TECHNIQUE: Multidetector CT imaging of the chest was performed using the standard protocol during bolus administration of intravenous contrast. Multiplanar CT image reconstructions and MIPs were obtained to evaluate the vascular anatomy. CONTRAST:  80 cc of Isovue 370 COMPARISON:  None FINDINGS: Cardiovascular: Exam detail is diminished secondary to suboptimal pulmonary arterial opacification and respiratory  motion artifact. The main pulmonary artery appears patent. The main pulmonary artery measures 4.1 cm in diameter suggesting PA hypertension. No saddle embolus. No proximal lobar pulmonary artery filling defects identified. Beyond the level of the proximal pulmonary arteries there is significantly diminished pulmonary artery detail limiting evaluation. Normal heart size. Aortic atherosclerosis. Calcification in the LAD and left circumflex and RCA coronary artery is noted. Mediastinum/Nodes: The trachea appears patent and is midline. Normal appearance of the esophagus. No mediastinal or hilar adenopathy. No axillary or supraclavicular adenopathy. Lungs/Pleura: No pleural effusion identified. Subsegmental atelectasis involving the right lower lobe and left base identified. There is asymmetric elevation of the right hemidiaphragm. Upper Abdomen: No acute abnormality. Musculoskeletal: Previous pedicle screw and rod fixation of the lower thoracic spine. Review of the MIP images confirms the above findings. IMPRESSION: 1. Exam detail diminished secondary to suboptimal pulmonary arterial opacification and respiratory motion artifact. No saddle embolus or proximal lobar pulmonary artery filling defects identified. 2. Increase caliber of the main pulmonary artery suggestive of PA hypertension. 3. Bilateral lower lobe subsegmental atelectasis and asymmetric elevation of the right hemidiaphragm. 4. Aortic Atherosclerosis (ICD10-I70.0). Coronary artery calcifications noted involving the LAD, left circumflex and RCA. Electronically Signed   By: Kerby Moors M.D.   On: 10/02/2017 14:26   US Renal  Result Date: 10/02/2017 CLINICAL DATA:  71 year old male with history of acute kidney injury. EXAM: RENAL / URINARY TRACT ULTRASOUND COMPLETE COMPARISON:  Abdominal ultrasound 07/14/2017. FINDINGS: Right Kidney: Length: 10.3 cm. Echogenicity within normal limits. In the lower pole of the left kidney there is a 2.2 x 2.0 x 2.0 cm  hypoechoic lesion. No definite increase through transmission appreciated associated with this lesion. No definite internal blood flow. No hydronephrosis visualized. Left Kidney: Length: 10.2 cm. Echogenicity within normal limits. No mass or hydronephrosis visualized. Bladder: Completely decompressed with Foley balloon catheter in place. IMPRESSION: 1. No acute findings.  Specifically, no hydroureteronephrosis. 2. Indeterminate lesion  in the lower pole of the left kidney measuring 2.2 x 2.0 x 2.0 cm. This is new compared to the prior abdominal ultrasound 07/14/2014. Further characterization with nonemergent MRI of the abdomen with and without IV gadolinium should be considered in the near future to exclude underlying neoplasm. Electronically Signed   By: Vinnie Langton M.D.   On: 10/02/2017 17:42   Dg Chest Port 1 View  Result Date: 10/02/2017 CLINICAL DATA:  Shortness of breath and hypertension EXAM: PORTABLE CHEST 1 VIEW COMPARISON:  March 29, 2017 FINDINGS: There is bibasilar atelectatic change. These no edema or consolidation. Heart size in and pulmonary vascularity are normal. No adenopathy. There is postoperative change in the lower cervical and lower thoracic spine. There is aortic atherosclerosis. IMPRESSION: Bibasilar atelectasis. No edema or consolidation. Stable cardiac silhouette. There is aortic atherosclerosis. Aortic Atherosclerosis (ICD10-I70.0). Electronically Signed   By: Lowella Grip III M.D.   On: 10/02/2017 09:55    Scheduled Meds: . aspirin EC  81 mg Oral Daily  . escitalopram  10 mg Oral Daily  . fesoterodine  8 mg Oral Daily  . gabapentin  400 mg Oral TID  . heparin  5,000 Units Subcutaneous Q8H  . hydrALAZINE  25 mg Oral Q8H  . insulin aspart  0-9 Units Subcutaneous TID WC  . levothyroxine  175 mcg Oral QAC breakfast  . metolazone  2.5 mg Oral Once  . potassium chloride  10 mEq Oral BID  . pramipexole  0.5 mg Oral BID  . tamsulosin  0.4 mg Oral Daily   Continuous  Infusions: . cefTRIAXone (ROCEPHIN)  IV Stopped (10/03/17 1241)     LOS: 2 days    Time spent: Total of 35 minutes spent with pt, greater than 50% of which was spent in discussion of  treatment, counseling and coordination of care    Chipper Oman, MD Pager: Text Page via [email or web address]   If 7PM-7AM, please contact night-coverage [email or web address] 10/04/2017, 8:59 AM

## 2017-10-04 NOTE — Progress Notes (Signed)
[facility]   Progress Note  Patient Name: Travis [name] Date of Encounter: 10/04/2017  Primary Cardiologist: Dr. Acie Fredrickson  Subjective   He is breathing better but he says not at baseline.  Denies chest pain. Ambulated in room.    Inpatient Medications    Scheduled Meds: . aspirin EC  81 mg Oral Daily  . escitalopram  10 mg Oral Daily  . fesoterodine  8 mg Oral Daily  . gabapentin  400 mg Oral TID  . heparin  5,000 Units Subcutaneous Q8H  . hydrALAZINE  25 mg Oral Q8H  . insulin aspart  0-9 Units Subcutaneous TID WC  . levothyroxine  175 mcg Oral QAC breakfast  . potassium chloride  10 mEq Oral BID  . pramipexole  0.5 mg Oral BID  . tamsulosin  0.4 mg Oral Daily   Continuous Infusions: . cefTRIAXone (ROCEPHIN)  IV Stopped (10/03/17 1241)   PRN Meds: acetaminophen, baclofen, polyethylene glycol   Vital Signs    Vitals:   10/03/17 1351 10/03/17 1951 10/03/17 2351 10/04/17 0352  BP: (!) 138/59 (!) 156/73 (!) 174/73 (!) 144/66  Pulse: 67 79 89 82  Resp: 20 19 20 17   Temp: 98.6 F (37 C) 98.9 F (37.2 C) 98 F (36.7 C) 99.8 F (37.7 C)  TempSrc: Oral Oral Oral Oral  SpO2: 98% 92% 91% 91%  Weight:    291 lb 14.2 oz (132.4 kg)  Height:        Intake/Output Summary (Last 24 hours) at 10/04/17 0736 Last data filed at 10/04/17 0539  Gross per 24 hour  Intake               50 ml  Output             1775 ml  Net            -1725 ml   Filed Weights   10/02/17 2045 10/03/17 0409 10/04/17 0352  Weight: 288 lb 9.3 oz (130.9 kg) 288 lb 9.3 oz (130.9 kg) 291 lb 14.2 oz (132.4 kg)    Telemetry    NSR - Personally Reviewed  ECG    NA - Personally Reviewed  Physical Exam   GEN: No  acute distress.   Neck: No  JVD Cardiac: RRR, no murmurs, rubs, or gallops.  Respiratory: Clear   to auscultation bilaterally. GI: Soft, nontender, non-distended, normal bowel sounds  MS:   Moderate edema; No deformity. Neuro:   Nonfocal  Psych: Oriented and appropriate    Labs    Chemistry  Recent Labs Lab 10/02/17 1005 10/03/17 0542 10/04/17 0411  NA 139 141 141  K 4.1 3.6 4.2  CL 104 100* 97*  CO2 29 32 35*  GLUCOSE 200* 169* 142*  BUN 34* 28* 32*  CREATININE 1.89* 1.52* 1.56*  CALCIUM 8.8* 8.5* 8.7*  PROT 6.9  --   --   ALBUMIN 3.7  --   --   AST 25  --   --   ALT 19  --   --   ALKPHOS 75  --   --   BILITOT 1.0  --   --   GFRNONAA 34* 44* 43*  GFRAA 39* 51* 49*  ANIONGAP 6 9 9      Hematology  Recent Labs Lab 10/02/17 1005 10/03/17 0542 10/04/17 0411  WBC 14.6* 13.1* 9.1  RBC 4.07* 3.82* 3.93*  HGB 12.2* 11.3* 11.8*  HCT 38.3* 36.9* 37.6*  MCV 94.1 96.6 95.7  MCH 30.0 29.6 30.0  MCHC 31.9 30.6 31.4  RDW 16.1* 16.5* 16.0*  PLT 144* 137* 147*    Cardiac EnzymesNo results for input(s): TROPONINI in the last 168 hours. No results for input(s): TROPIPOC in the last 168 hours.   BNP  Recent Labs Lab 10/02/17 0944  BNP 169.8*     DDimer   Recent Labs Lab 10/02/17 1008  DDIMER 0.82*     Radiology    Ct Angio Chest Pe W/cm &/or Wo Cm  Result Date: 10/02/2017 CLINICAL DATA:  Suspect pulmonary embolus. Intermediate probability. EXAM: CT ANGIOGRAPHY CHEST WITH CONTRAST TECHNIQUE: Multidetector CT imaging of the chest was performed using the standard protocol during bolus administration of intravenous contrast. Multiplanar CT image reconstructions and MIPs were obtained to evaluate the vascular anatomy. CONTRAST:  80 cc of Isovue 370 COMPARISON:  None FINDINGS: Cardiovascular: Exam detail is diminished secondary to suboptimal pulmonary arterial opacification and respiratory motion artifact. The main pulmonary artery appears patent. The main pulmonary artery measures 4.1 cm in diameter suggesting PA hypertension. No saddle embolus. No proximal lobar pulmonary artery filling defects identified. Beyond the level of the proximal pulmonary arteries there is significantly diminished pulmonary artery detail limiting evaluation. Normal heart  size. Aortic atherosclerosis. Calcification in the LAD and left circumflex and RCA coronary artery is noted. Mediastinum/Nodes: The trachea appears patent and is midline. Normal appearance of the esophagus. No mediastinal or hilar adenopathy. No axillary or supraclavicular adenopathy. Lungs/Pleura: No pleural effusion identified. Subsegmental atelectasis involving the right lower lobe and left base identified. There is asymmetric elevation of the right hemidiaphragm. Upper Abdomen: No acute abnormality. Musculoskeletal: Previous pedicle screw and rod fixation of the lower thoracic spine. Review of the MIP images confirms the above findings. IMPRESSION: 1. Exam detail diminished secondary to suboptimal pulmonary arterial opacification and respiratory motion artifact. No saddle embolus or proximal lobar pulmonary artery filling defects identified. 2. Increase caliber of the main pulmonary artery suggestive of PA hypertension. 3. Bilateral lower lobe subsegmental atelectasis and asymmetric elevation of the right hemidiaphragm. 4. Aortic Atherosclerosis (ICD10-I70.0). Coronary artery calcifications noted involving the LAD, left circumflex and RCA. Electronically Signed   By: Kerby Moors M.D.   On: 10/02/2017 14:26   US Renal  Result Date: 10/02/2017 CLINICAL DATA:  71 year old male with history of acute kidney injury. EXAM: RENAL / URINARY TRACT ULTRASOUND COMPLETE COMPARISON:  Abdominal ultrasound 07/14/2017. FINDINGS: Right Kidney: Length: 10.3 cm. Echogenicity within normal limits. In the lower pole of the left kidney there is a 2.2 x 2.0 x 2.0 cm hypoechoic lesion. No definite increase through transmission appreciated associated with this lesion. No definite internal blood flow. No hydronephrosis visualized. Left Kidney: Length: 10.2 cm. Echogenicity within normal limits. No mass or hydronephrosis visualized. Bladder: Completely decompressed with Foley balloon catheter in place. IMPRESSION: 1. No acute  findings.  Specifically, no hydroureteronephrosis. 2. Indeterminate lesion in the lower pole of the left kidney measuring 2.2 x 2.0 x 2.0 cm. This is new compared to the prior abdominal ultrasound 07/14/2014. Further characterization with nonemergent MRI of the abdomen with and without IV gadolinium should be considered in the near future to exclude underlying neoplasm. Electronically Signed   By: Vinnie Langton M.D.   On: 10/02/2017 17:42   Dg Chest Port 1 View  Result Date: 10/02/2017 CLINICAL DATA:  Shortness of breath and hypertension EXAM: PORTABLE CHEST 1 VIEW COMPARISON:  March 29, 2017 FINDINGS: There is bibasilar atelectatic change. These no edema or consolidation. Heart size in and pulmonary vascularity are normal.  No adenopathy. There is postoperative change in the lower cervical and lower thoracic spine. There is aortic atherosclerosis. IMPRESSION: Bibasilar atelectasis. No edema or consolidation. Stable cardiac silhouette. There is aortic atherosclerosis. Aortic Atherosclerosis (ICD10-I70.0). Electronically Signed   By: Lowella Grip III M.D.   On: 10/02/2017 09:55    Cardiac Studies   NA  Patient Profile     71 y.o. 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  Still with significant volume overload.  I will give a one time dose of zaroxolyn today.    ACUTE ON CHRONIC CKD III:   Creat is stable. Follow creat closely.  CAD:  Nonobstructive.  No further work up Signed, Minus Breeding, MD  10/04/2017, 7:36 AM

## 2017-10-04 NOTE — Progress Notes (Signed)
Patient has home machine and was already wearing when RT entered. RT will continue to monitor.

## 2017-10-04 NOTE — Evaluation (Signed)
Occupational Therapy Evaluation Patient Details Name: Travis [name] MRN: [id number] DOB: 11-27-1946 Today's Date: 10/04/2017    History of Present Illness 71 y.o.malewith PMH of dHF, HTN, CKD, h/o Tobacco Use, DM, Recent UTI (august) presented with fever. Pt presenting with fever, shortness of breath, dyspnea on exertion associated with progressive bilateral leg edema.   Clinical Impression   Pt reports he was fairly independent with ADL PTA; wife assisting minimally with LB dressing. Currently pt requires max assist +2 for sit to stand from chair and min assist for functional mobility. Pt needs min assist for UB ADL in sitting and max assist for LB ADL. Pt presenting with generalized weakness, deconditioning, impaired balance, decreased activity tolerance impacting his independence and safety with ADL and functional mobility. Pt planning to d/c home with 24/7 supervision from family. Recommending HHOT for follow up to maximize independence and safety with ADL and functional mobility upon return home. Pending progress, pt may benefit from short term SNF for continued rehab. Pt would benefit from continued skilled OT to address established goals.    Follow Up Recommendations  Home health OT;Supervision/Assistance - 24 hour    Equipment Recommendations  None recommended by OT    Recommendations for Other Services PT consult     Precautions / Restrictions Precautions Precautions: Fall Precaution Comments: watch sats Restrictions Weight Bearing Restrictions: No      Mobility Bed Mobility               General bed mobility comments: Pt OOB in chair upon arrival  Transfers Overall transfer level: Needs assistance Equipment used: Rolling walker (2 wheeled) Transfers: Sit to/from Stand Sit to Stand: Max assist;+2 physical assistance         General transfer comment: Wife assisting with transfer. Assist to boost up from chair and adjust feet in proper position. Attempted  x4 before able to acheive upright standing    Balance Overall balance assessment: Needs assistance Sitting-balance support: Feet supported;No upper extremity supported Sitting balance-Leahy Scale: Good     Standing balance support: Bilateral upper extremity supported Standing balance-Leahy Scale: Poor Standing balance comment: RW for support                           ADL either performed or assessed with clinical judgement   ADL Overall ADL's : Needs assistance/impaired Eating/Feeding: Set up;Sitting   Grooming: Min guard;Sitting   Upper Body Bathing: Minimal assistance;Sitting   Lower Body Bathing: Maximal assistance;Sit to/from stand   Upper Body Dressing : Minimal assistance;Sitting   Lower Body Dressing: Maximal assistance;Sit to/from Health and safety inspector Details (indicate cue type and reason): Max assist +2 for sit to stand min assist for functional mobility         Functional mobility during ADLs: Minimal assistance;Rolling walker General ADL Comments: VSS throughout; SOB with sit to stand--educated on pursed lip breathing     Vision         Perception     Praxis      Pertinent Vitals/Pain Pain Assessment: Faces Faces Pain Scale: Hurts little more Pain Location: back Pain Descriptors / Indicators: Sore Pain Intervention(s): Monitored during session     Hand Dominance Right   Extremity/Trunk Assessment Upper Extremity Assessment Upper Extremity Assessment: Generalized weakness   Lower Extremity Assessment Lower Extremity Assessment: Defer to PT evaluation       Communication Communication Communication: No difficulties   Cognition Arousal/Alertness: Awake/alert Behavior During Therapy:  WFL for tasks assessed/performed Overall Cognitive Status: Within Functional Limits for tasks assessed                                     General Comments       Exercises     Shoulder Instructions      Home Living  Family/patient expects to be discharged to:: Private residence Living Arrangements: Spouse/significant other Available Help at Discharge: Family;Available 24 hours/day Type of Home: House Home Access: Ramped entrance     Home Layout: One level     Bathroom Shower/Tub: Occupational psychologist: Handicapped height     Home Equipment: Environmental consultant - 2 wheels;Walker - 4 wheels;Cane - single point;Bedside commode;Wheelchair - manual;Shower seat - built in;Grab bars - tub/shower          Prior Functioning/Environment Level of Independence: Needs assistance  Gait / Transfers Assistance Needed: Amb with RW. Requires assist from wife to stand from chair.  ADL's / Homemaking Assistance Needed: wife has to assist with donning/doffing socks and shoes             OT Problem List: Decreased strength;Decreased range of motion;Decreased activity tolerance;Impaired balance (sitting and/or standing);Decreased knowledge of use of DME or AE;Decreased knowledge of precautions;Cardiopulmonary status limiting activity;Obesity;Impaired UE functional use;Pain;Increased edema      OT Treatment/Interventions: Self-care/ADL training;Therapeutic exercise;Energy conservation;DME and/or AE instruction;Patient/family education;Balance training;Therapeutic activities    OT Goals(Current goals can be found in the care plan section) Acute Rehab OT Goals Patient Stated Goal: return to independence OT Goal Formulation: With patient/family Time For Goal Achievement: 10/18/17 Potential to Achieve Goals: Good ADL Goals Pt Will Perform Upper Body Bathing: with supervision;sitting Pt Will Perform Lower Body Bathing: with min assist;sit to/from stand Pt Will Transfer to Toilet: with supervision;ambulating;bedside commode Pt Will Perform Toileting - Clothing Manipulation and hygiene: with supervision;sit to/from stand Pt Will Perform Tub/Shower Transfer: Shower transfer;with supervision;ambulating;shower  seat;rolling walker Additional ADL Goal #1: Pt will independently verbally recall 3 energy conservation strategies and use during ADL.  OT Frequency: Min 2X/week   Barriers to D/C:            Co-evaluation              AM-PAC PT "6 Clicks" Daily Activity     Outcome Measure Help from another person eating meals?: None Help from another person taking care of personal grooming?: A Little Help from another person toileting, which includes using toliet, bedpan, or urinal?: A Lot Help from another person bathing (including washing, rinsing, drying)?: A Lot Help from another person to put on and taking off regular upper body clothing?: A Little Help from another person to put on and taking off regular lower body clothing?: A Lot 6 Click Score: 16   End of Session Equipment Utilized During Treatment: Oxygen;Rolling walker  Activity Tolerance: Patient tolerated treatment well Patient left: in chair;with call bell/phone within reach;with family/visitor present  OT Visit Diagnosis: Unsteadiness on feet (R26.81);Other abnormalities of gait and mobility (R26.89);Muscle weakness (generalized) (M62.81)                Time: 4268-3419 OT Time Calculation (min): 27 min Charges:  OT General Charges $OT Visit: 1 Visit OT Evaluation $OT Eval Moderate Complexity: 1 Mod OT Treatments $Self Care/Home Management : 8-22 mins G-Codes:     Spyros Winch A. Ulice Brilliant, M.S., OTR/L Pager: [place] 10/04/2017,  4:07 PM

## 2017-10-05 DIAGNOSIS — I5031 Acute diastolic (congestive) heart failure: Secondary | ICD-10-CM

## 2017-10-05 DIAGNOSIS — N390 Urinary tract infection, site not specified: Secondary | ICD-10-CM

## 2017-10-05 DIAGNOSIS — M48061 Spinal stenosis, lumbar region without neurogenic claudication: Secondary | ICD-10-CM | POA: Diagnosis present

## 2017-10-05 DIAGNOSIS — I10 Essential (primary) hypertension: Secondary | ICD-10-CM | POA: Diagnosis present

## 2017-10-05 DIAGNOSIS — I5033 Acute on chronic diastolic (congestive) heart failure: Secondary | ICD-10-CM

## 2017-10-05 DIAGNOSIS — D62 Acute posthemorrhagic anemia: Secondary | ICD-10-CM | POA: Insufficient documentation

## 2017-10-05 DIAGNOSIS — N183 Chronic kidney disease, stage 3 unspecified: Secondary | ICD-10-CM | POA: Diagnosis present

## 2017-10-05 DIAGNOSIS — M4712 Other spondylosis with myelopathy, cervical region: Secondary | ICD-10-CM

## 2017-10-05 DIAGNOSIS — G4733 Obstructive sleep apnea (adult) (pediatric): Secondary | ICD-10-CM | POA: Diagnosis present

## 2017-10-05 LAB — BASIC METABOLIC PANEL
Anion gap: 9 (ref 5–15)
BUN: 32 mg/dL — AB (ref 6–20)
CO2: 26 mmol/L (ref 22–32)
CREATININE: 1.24 mg/dL (ref 0.61–1.24)
Calcium: 8.9 mg/dL (ref 8.9–10.3)
Chloride: 104 mmol/L (ref 101–111)
GFR, EST NON AFRICAN AMERICAN: 56 mL/min — AB (ref >60)
Glucose, Bld: 138 mg/dL — ABNORMAL HIGH (ref 65–99)
Potassium: 4 mmol/L (ref 3.5–5.1)
SODIUM: 139 mmol/L (ref 135–145)

## 2017-10-05 LAB — GLUCOSE, CAPILLARY
GLUCOSE-CAPILLARY: 142 mg/dL — AB (ref 65–99)
GLUCOSE-CAPILLARY: 181 mg/dL — AB (ref 65–99)
Glucose-Capillary: 184 mg/dL — ABNORMAL HIGH (ref 65–99)
Glucose-Capillary: 185 mg/dL — ABNORMAL HIGH (ref 65–99)

## 2017-10-05 MED ORDER — CEFPODOXIME PROXETIL 200 MG PO TABS
200.0000 mg | ORAL_TABLET | Freq: Two times a day (BID) | ORAL | Status: DC
  Administered 2017-10-06 – 2017-10-08 (×5): 200 mg via ORAL
  Filled 2017-10-05 (×5): qty 1

## 2017-10-05 MED ORDER — FUROSEMIDE 10 MG/ML IJ SOLN
60.0000 mg | Freq: Once | INTRAMUSCULAR | Status: AC
  Administered 2017-10-05: 60 mg via INTRAVENOUS
  Filled 2017-10-05: qty 6

## 2017-10-05 NOTE — Progress Notes (Signed)
Progress Note  Patient Name: Travis [name] Date of Encounter: 10/05/2017  Primary Cardiologist: Nahser   Subjective   71 year old gentleman with known history of chronic diastolic congestive heart failure. She he was admitted with acute hypoxemia.  He seems to be feeling better with diuresis. 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 aspirin EC  81 mg Oral Daily  . escitalopram  10 mg Oral Daily  . fesoterodine  8 mg Oral Daily  . gabapentin  400 mg Oral TID  . heparin  5,000 Units Subcutaneous Q8H  . hydrALAZINE  25 mg Oral Q8H  . insulin aspart  0-9 Units Subcutaneous TID WC  . levothyroxine  175 mcg Oral QAC breakfast  . losartan  50 mg Oral Daily  . potassium chloride  10 mEq Oral BID  . pramipexole  0.5 mg Oral BID  . tamsulosin  0.4 mg Oral Daily  . traZODone  50 mg Oral QHS   Continuous Infusions: . cefTRIAXone (ROCEPHIN)  IV Stopped (10/04/17 1348)   PRN Meds: acetaminophen, baclofen, polyethylene glycol   Vital Signs    Vitals:   10/05/17 0400 10/05/17 0622 10/05/17 0640 10/05/17 0816  BP: 137/64 (!) 150/62  (!) 157/56  Pulse: 60     Resp: 19     Temp: 99 F (37.2 C)  98.7 F (37.1 C)   TempSrc: Axillary  Oral   SpO2: 90%   (!) 88%  Weight: 299 lb 9.7 oz (135.9 kg)  282 lb 10.1 oz (128.2 kg)   Height:        Intake/Output Summary (Last 24 hours) at 10/05/17 1021 Last data filed at 10/05/17 0642  Gross per 24 hour  Intake              240 ml  Output             1625 ml  Net            -1385 ml   Filed Weights   10/04/17 0352 10/05/17 0400 10/05/17 0640  Weight: 291 lb 14.2 oz (132.4 kg) 299 lb 9.7 oz (135.9 kg) 282 lb 10.1 oz (128.2 kg)    Telemetry     NSR  - Personally Reviewed  ECG     NSR  - Personally Reviewed  Physical Exam   GEN: No acute distress.  Pleasant man,  NAD , generally weak  Neck: No JVD Cardiac: RR, no  murmurs, rubs, or gallops.  Respiratory: Clear to auscultation bilaterally. GI: Soft, nontender, non-distended  MS: No edema; No deformity. Neuro:  Nonfocal  Psych: Normal affect   Labs    Chemistry Recent Labs Lab 10/02/17 1005 10/03/17 0542 10/04/17 0411 10/05/17 0823  NA 139 141 141 139  K 4.1 3.6 4.2 4.0  CL 104 100* 97* 104  CO2 29 32 35* 26  GLUCOSE 200* 169* 142* 138*  BUN 34* 28* 32* 32*  CREATININE 1.89* 1.52* 1.56* 1.24  CALCIUM 8.8* 8.5* 8.7* 8.9  PROT 6.9  --   --   --   ALBUMIN 3.7  --   --   --   AST 25  --   --   --   ALT 19  --   --   --   ALKPHOS 75  --   --   --   BILITOT 1.0  --   --   --  GFRNONAA 34* 44* 43* 56*  GFRAA 39* 51* 49* >60  ANIONGAP 6 9 9 9      Hematology Recent Labs Lab 10/02/17 1005 10/03/17 0542 10/04/17 0411  WBC 14.6* 13.1* 9.1  RBC 4.07* 3.82* 3.93*  HGB 12.2* 11.3* 11.8*  HCT 38.3* 36.9* 37.6*  MCV 94.1 96.6 95.7  MCH 30.0 29.6 30.0  MCHC 31.9 30.6 31.4  RDW 16.1* 16.5* 16.0*  PLT 144* 137* 147*    Cardiac EnzymesNo results for input(s): TROPONINI in the last 168 hours. No results for input(s): TROPIPOC in the last 168 hours.   BNP Recent Labs Lab 10/02/17 0944  BNP 169.8*     DDimer  Recent Labs Lab 10/02/17 1008  DDIMER 0.82*     Radiology    No results found.  Cardiac Studies     Patient Profile     71 y.o. male with chronic diastolic congestive heart failure. He has had recurrent episodes of respiratory distress.  Assessment & Plan    1. Acute on chronic diastolic congestive heart failure. He's feeling much better after diuresis. Continue current diuresis.  It's curious that he has such profound respiratory distress with what appears to be relatively well-controlled CHF. We will get pulled a function test to see if he has any pulmonary component to this.  I have wondered over the years whether he has a neuromuscular disorder contributed to his weakness. He was found have peripheral  neuropathy but Dr. Jannifer Franklin was not able to find  find any central neuromuscular disorder to explain his shortness of breath and respiratory failure.    For questions or updates, please contact [place] Please consult [email or web address] for contact info under Cardiology/STEMI.      Signed, Mertie Moores, MD  10/05/2017, 10:21 AM

## 2017-10-05 NOTE — Progress Notes (Signed)
PROGRESS NOTE [facility]   [name]   VHQ:[id number] DOB: 1946/01/04  DOA: 10/02/2017 PCP: [name], MD   Brief Narrative:  Travis [name] is a 71 year old male with past medical history of diastolic heart failure, hypertension, chronic kidney disease, diabetes. Patient presented to the emergency department after having a fever of 103 followed by an episode of nausea and vomiting. Other symptoms were shortness of breath and dyspnea with progressive bilateral edema. Upon ED evaluation patient was found to have signs of fluid overload chest x-ray, UA grossly abnormal Patient was admitted with working diagnosis of UTI and acute exacerbation of diastolic CHF. Cardiology was consulted and patient was started on IV Lasix. Patient was also started on IV antibiotics for UTI.  Subjective: Off O2 saturations at 90-91%. Report breathing as close it can get to baseline, leg edema significantly better.   Assessment & Plan: UTI - > 100,000 Citrobacter  Initially treated with Rocephin, transitioned to Vantin to complete 7 days of abx.  Blood cultures no growth to date  WBC normalized  Continue to monitor  Acute on chronic diastolic CHF  Exacerbated by infectious process and aggressive hydration Cardiology recommending PFT to r/o pulmonary component - If this is not done by tomorrow can be done as outpatient. Continue IV diuresis for 24 more hrs, negative fluid balance.  Monitor daily weight and I&Os - weight is down 9 lbs  Continue Ted Hose   Acute respiratory failure with hypoxia due to volume overload  - improved  Responded well to IV lasix and O2  Patient was started on azithromycin felt to have early PNA will d/c no PNA on CXR.  Wean O2 as tolerated  O2 saturation dropping to 83% with ambulation - wonder if there is any lung components   Acute renal failure on CKD stage III - stable - Cardiorenal syndrome  Cr at baseline  Monitor while on aggressive diuresis  Renal US  no acute process although shows hypoechoic lesion od 2 cm - need MRI as outpatient   OSA  CPAP at night   HTN  BP acceptable  Continue Hydralazine and Losartan  Continue to monitor   Deconditioning Recommending CIR - patient prefer home health PT. Will continue to progress.   DVT prophylaxis: Heparin Sq Code Status: Full Code Family Communication: None at bedside  Disposition Plan:  CIR vs home in next 24 hrs    Consultants:   Cardiology   PMR   Procedures:   None   Antimicrobials: Anti-infectives    Start     Dose/Rate Route Frequency Ordered Stop   10/03/17 1300  cefTRIAXone (ROCEPHIN) 1 g in dextrose 5 % 50 mL IVPB     1 g 100 mL/hr over 30 Minutes Intravenous Every 24 hours 10/02/17 1549     10/03/17 1000  azithromycin (ZITHROMAX) tablet 250 mg  Status:  Discontinued     250 mg Oral Daily 10/02/17 1551 10/03/17 0928   10/02/17 1600  azithromycin (ZITHROMAX) tablet 500 mg     500 mg Oral Daily 10/02/17 1551 10/03/17 0959   10/02/17 1300  cefTRIAXone (ROCEPHIN) 1 g in dextrose 5 % 50 mL IVPB     1 g 100 mL/hr over 30 Minutes Intravenous  Once 10/02/17 1254 10/02/17 1443      Objective: Vitals:   10/05/17 0400 10/05/17 0622 10/05/17 0640 10/05/17 0816  BP: 137/64 (!) 150/62  (!) 157/56  Pulse: 60     Resp: 19  Temp: 99 F (37.2 C)  98.7 F (37.1 C)   TempSrc: Axillary  Oral   SpO2: 90%   (!) 88%  Weight: 135.9 kg (299 lb 9.7 oz)  128.2 kg (282 lb 10.1 oz)   Height:        Intake/Output Summary (Last 24 hours) at 10/05/17 1529 Last data filed at 10/05/17 8250  Gross per 24 hour  Intake              240 ml  Output             1625 ml  Net            -1385 ml   Filed Weights   10/04/17 0352 10/05/17 0400 10/05/17 0640  Weight: 132.4 kg (291 lb 14.2 oz) 135.9 kg (299 lb 9.7 oz) 128.2 kg (282 lb 10.1 oz)    Examination:  General: NAD  Cardiovascular: RRR, S1/S2  Respiratory: Breast sound diminished at the bases  Abdominal: Soft, NT, ND, bowel  sounds + Extremities: Trace LE edema   Data Reviewed: I have personally reviewed following labs and imaging studies  CBC:  Recent Labs Lab 10/02/17 1005 10/03/17 0542 10/04/17 0411  WBC 14.6* 13.1* 9.1  NEUTROABS 13.0*  --   --   HGB 12.2* 11.3* 11.8*  HCT 38.3* 36.9* 37.6*  MCV 94.1 96.6 95.7  PLT 144* 137* 539*   Basic Metabolic Panel:  Recent Labs Lab 10/02/17 1005 10/03/17 0542 10/04/17 0411 10/05/17 0823  NA 139 141 141 139  K 4.1 3.6 4.2 4.0  CL 104 100* 97* 104  CO2 29 32 35* 26  GLUCOSE 200* 169* 142* 138*  BUN 34* 28* 32* 32*  CREATININE 1.89* 1.52* 1.56* 1.24  CALCIUM 8.8* 8.5* 8.7* 8.9   GFR: Estimated Creatinine Clearance: 75.6 mL/min (by C-G formula based on SCr of 1.24 mg/dL). Liver Function Tests:  Recent Labs Lab 10/02/17 1005  AST 25  ALT 19  ALKPHOS 75  BILITOT 1.0  PROT 6.9  ALBUMIN 3.7   No results for input(s): LIPASE, AMYLASE in the last 168 hours. No results for input(s): AMMONIA in the last 168 hours. Coagulation Profile: No results for input(s): INR, PROTIME in the last 168 hours. Cardiac Enzymes: No results for input(s): CKTOTAL, CKMB, CKMBINDEX, TROPONINI in the last 168 hours. BNP (last 3 results)  Recent Labs  02/12/17 1522  PROBNP 109   HbA1C: No results for input(s): HGBA1C in the last 72 hours. CBG:  Recent Labs Lab 10/04/17 1249 10/04/17 1640 10/04/17 2006 10/05/17 0816 10/05/17 1145  GLUCAP 184* 140* 146* 142* 184*   Lipid Profile: No results for input(s): CHOL, HDL, LDLCALC, TRIG, CHOLHDL, LDLDIRECT in the last 72 hours. Thyroid Function Tests:  Recent Labs  10/02/17 1630  TSH 0.446   Anemia Panel: No results for input(s): VITAMINB12, FOLATE, FERRITIN, TIBC, IRON, RETICCTPCT in the last 72 hours. Sepsis Labs:  Recent Labs Lab 10/02/17 1025 10/02/17 1254  LATICACIDVEN 1.10 0.81    Recent Results (from the past 240 hour(s))  Urine culture     Status: Abnormal   Collection Time: 10/02/17   9:59 AM  Result Value Ref Range Status   Specimen Description URINE, RANDOM  Final   Special Requests NONE  Final   Culture >=100,000 COLONIES/mL CITROBACTER FREUNDII (A)  Final   Report Status 10/04/2017 FINAL  Final   Organism ID, Bacteria CITROBACTER FREUNDII (A)  Final      Susceptibility   Citrobacter freundii - MIC*  CEFAZOLIN >=64 RESISTANT Resistant     CEFTRIAXONE <=1 SENSITIVE Sensitive     CIPROFLOXACIN <=0.25 SENSITIVE Sensitive     GENTAMICIN <=1 SENSITIVE Sensitive     IMIPENEM <=0.25 SENSITIVE Sensitive     NITROFURANTOIN <=16 SENSITIVE Sensitive     TRIMETH/SULFA <=20 SENSITIVE Sensitive     PIP/TAZO <=4 SENSITIVE Sensitive     * >=100,000 COLONIES/mL CITROBACTER FREUNDII  Blood Culture (routine x 2)     Status: None (Preliminary result)   Collection Time: 10/02/17 10:05 AM  Result Value Ref Range Status   Specimen Description BLOOD RIGHT ANTECUBITAL  Final   Special Requests   Final    BOTTLES DRAWN AEROBIC AND ANAEROBIC Blood Culture adequate volume   Culture NO GROWTH 3 DAYS  Final   Report Status PENDING  Incomplete  Blood Culture (routine x 2)     Status: None (Preliminary result)   Collection Time: 10/02/17 10:20 AM  Result Value Ref Range Status   Specimen Description BLOOD LEFT HAND  Final   Special Requests   Final    BOTTLES DRAWN AEROBIC AND ANAEROBIC Blood Culture adequate volume   Culture NO GROWTH 3 DAYS  Final   Report Status PENDING  Incomplete      Radiology Studies: No results found.  Scheduled Meds: . aspirin EC  81 mg Oral Daily  . escitalopram  10 mg Oral Daily  . fesoterodine  8 mg Oral Daily  . gabapentin  400 mg Oral TID  . heparin  5,000 Units Subcutaneous Q8H  . hydrALAZINE  25 mg Oral Q8H  . insulin aspart  0-9 Units Subcutaneous TID WC  . levothyroxine  175 mcg Oral QAC breakfast  . losartan  50 mg Oral Daily  . potassium chloride  10 mEq Oral BID  . pramipexole  0.5 mg Oral BID  . tamsulosin  0.4 mg Oral Daily  .  traZODone  50 mg Oral QHS   Continuous Infusions: . cefTRIAXone (ROCEPHIN)  IV 1 g (10/05/17 1455)     LOS: 3 days    Time spent: Total of 15 minutes spent with pt, greater than 50% of which was spent in discussion of  treatment, counseling and coordination of care   Chipper Oman, MD Pager: Text Page via [email or web address]   If 7PM-7AM, please contact night-coverage [email or web address] 10/05/2017, 3:29 PM

## 2017-10-05 NOTE — Evaluation (Signed)
Physical Therapy Evaluation Patient Details Name: Travis [name] MRN: [id number] DOB: 1946/06/22 Today's Date: 10/05/2017   History of Present Illness  71 y.o.malewith PMH of dHF, HTN, CKD, h/o Tobacco Use, DM, Recent UTI (august) presented with fever. Pt presenting with fever, shortness of breath, dyspnea on exertion associated with progressive bilateral leg edema.  Clinical Impression  Pt was seen with OT for safety and pt limitations for mobility during a task from BR to his chair.  Wife and daughter are present, and discussed his reasons for going to inpt therapy given his sat drops, back pain, LE weakness and lack of I with transfers and gait (high level eval).  He is not as willing to consider but does acknowledge the issues are all real and limiting to him.  The family will be there to discuss the care needs, and hopefully pt will continue in inpt care with his ongoing medical changes that could be better managed in this inpt environment.  Continue acute therapy toward all these goals for strengthening and standing balance stability.    Follow Up Recommendations CIR    Equipment Recommendations  Rolling walker with 5" wheels (unless pt has a good walker)    Recommendations for Other Services Rehab consult     Precautions / Restrictions Precautions Precautions: Fall (telemetry) Precaution Comments: sats dropped with gait to 83% Restrictions Weight Bearing Restrictions: No      Mobility  Bed Mobility               General bed mobility comments: up when PT arrived  Transfers Overall transfer level: Needs assistance Equipment used: Rolling walker (2 wheeled);2 person hand held assist Transfers: Sit to/from Stand Sit to Stand: Mod assist;+2 physical assistance;+2 safety/equipment            Ambulation/Gait Ambulation/Gait assistance: Min assist;Mod assist;+2 physical assistance;+2 safety/equipment Ambulation Distance (Feet): 40 Feet Assistive device: Rolling  walker (2 wheeled);2 person hand held assist Gait Pattern/deviations: Step-to pattern;Step-through pattern;Decreased stride length;Trunk flexed;Wide base of support;Shuffle;Decreased weight shift to left;Decreased stance time - left;Decreased dorsiflexion - left Gait velocity: reduced Gait velocity interpretation: Below normal speed for age/gender General Gait Details: LLE is weak per pt but strength testing reveals RLE is weak  Financial trader Rankin (Stroke Patients Only)       Balance Overall balance assessment: Needs assistance Sitting-balance support: Feet supported;Single extremity supported Sitting balance-Leahy Scale: Good   Postural control: Posterior lean Standing balance support: Bilateral upper extremity supported;During functional activity Standing balance-Leahy Scale: Poor Standing balance comment: requires RW to stand and control gait                             Pertinent Vitals/Pain Pain Assessment: Faces Faces Pain Scale: Hurts even more Pain Location: back and R hip Pain Descriptors / Indicators: Guarding Pain Intervention(s): Limited activity within patient's tolerance;Monitored during session;Premedicated before session;Repositioned    Home Living Family/patient expects to be discharged to:: Private residence Living Arrangements: Spouse/significant other Available Help at Discharge: Family;Available 24 hours/day Type of Home: House Home Access: Ramped entrance     Home Layout: One level Home Equipment: Walker - 2 wheels;Walker - 4 wheels;Cane - single point;Bedside commode;Wheelchair - manual;Shower seat - built in;Grab bars - tub/shower Additional Comments: 2 mod assist to stand, wife with pt alone many times    Prior Function Level of Independence:  Needs assistance   Gait / Transfers Assistance Needed: Amb with RW. Requires assist from wife to stand from chair.   ADL's / Homemaking Assistance  Needed: wife has to assist with donning/doffing socks and shoes   Comments: uses WC and lift chair at home     Hand Dominance   Dominant Hand: Right    Extremity/Trunk Assessment   Upper Extremity Assessment Upper Extremity Assessment: Overall WFL for tasks assessed    Lower Extremity Assessment Lower Extremity Assessment: RLE deficits/detail RLE Deficits / Details: 4- strength RLE hip and ankle, knee 4+ext and flex 3+ RLE Coordination: decreased fine motor;decreased gross motor    Cervical / Trunk Assessment Cervical / Trunk Assessment: Normal  Communication   Communication: No difficulties  Cognition Arousal/Alertness: Awake/alert Behavior During Therapy: WFL for tasks assessed/performed Overall Cognitive Status: Within Functional Limits for tasks assessed                                 General Comments: pt did not sound open to inpt care for rehab      General Comments      Exercises Other Exercises Other Exercises: L DF 3+   Assessment/Plan    PT Assessment Patient needs continued PT services  PT Problem List Decreased strength;Decreased range of motion;Decreased activity tolerance;Decreased cognition       PT Treatment Interventions DME instruction;Gait training;Functional mobility training;Therapeutic activities;Therapeutic exercise;Balance training;Neuromuscular re-education;Patient/family education    PT Goals (Current goals can be found in the Care Plan section)  Acute Rehab PT Goals Patient Stated Goal: to return to I gait and get RLE pain managed PT Goal Formulation: With patient/family Time For Goal Achievement: 10/19/17 Potential to Achieve Goals: Good    Frequency Min 3X/week   Barriers to discharge Decreased caregiver support (needs 2 person assist from commode and O2 sats drop ) sats down to 83% with effort, 90% with recovery    Co-evaluation               AM-PAC PT "6 Clicks" Daily Activity  Outcome Measure  Difficulty turning over in bed (including adjusting bedclothes, sheets and blankets)?: Unable Difficulty moving from lying on back to sitting on the side of the bed? : Unable Difficulty sitting down on and standing up from a chair with arms (e.g., wheelchair, bedside commode, etc,.)?: Unable Help needed moving to and from a bed to chair (including a wheelchair)?: A Lot Help needed walking in hospital room?: A Lot Help needed climbing 3-5 steps with a railing? : Total 6 Click Score: 8    End of Session Equipment Utilized During Treatment: Gait belt Activity Tolerance: Patient limited by fatigue;Treatment limited secondary to medical complications (Comment) (O2 sats dropped to 83% with gait and returned to 90% with re) Patient left: in chair;with call bell/phone within reach;with family/visitor present Nurse Communication: Mobility status;Other (comment) (O2 sats declined with gait) PT Visit Diagnosis: Unsteadiness on feet (R26.81);Ataxic gait (R26.0);Muscle weakness (generalized) (M62.81);Pain Pain - Right/Left: Right Pain - part of body: Hip    Time: 7846-9629 PT Time Calculation (min) (ACUTE ONLY): 36 min   Charges:   PT Evaluation $PT Eval High Complexity: 1 High     PT G Codes:   PT G-Codes **NOT FOR INPATIENT CLASS** Functional Assessment Tool Used: AM-PAC 6 Clicks Basic Mobility    Ramond Dial 10/05/2017, 10:45 AM   Mee Hives, PT MS Acute Rehab Dept. Number: [facility] [phone] and  [place] [phone]

## 2017-10-05 NOTE — Consult Note (Signed)
Physical Medicine and Rehabilitation Consult  Reason for Consult:  Decline in mobility and ability to carry out ADLs. Referring Physician:    HPI: Travis [name] is a 71 y.o. 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  History taken from chart review and patient. He was treated with IV diuresis for acute on chronic diastolic CHF with improvement but continues to have hypoxia with activity. Therapy evaluations done today revealing decline in mobility and ability to carry out ADLs. CIR recommended for follow up therapy.  Wife reports that he's stronger now.  He had outpatient PT earlier this year. He required assistance with lower body ADLs at baseline.  Patient and wife would prefer [place] with transition to outpatient therapy as he gets stronger.     Review of Systems  HENT: Negative for ear pain and tinnitus.   Eyes: Negative for blurred vision and double vision.  Respiratory: Positive for shortness of breath. Negative for cough.   Cardiovascular: Positive for leg swelling (much improved). Negative for chest pain and palpitations.  Gastrointestinal: Negative for abdominal pain, constipation, heartburn and nausea.  Genitourinary: Positive for frequency and urgency.  Musculoskeletal: Positive for back pain, falls and myalgias.  Skin: Negative for itching and rash.  Neurological: Positive for sensory change and weakness (stronger then past 2 weeks). Negative for dizziness and headaches.  Psychiatric/Behavioral: Negative for memory loss. The patient is nervous/anxious.   All other systems reviewed and are negative.     Past Medical History:  Diagnosis Date  . Cervical myelopathy (HCC)    with myelomalacia at C5-6 and C3-4  . CHF (congestive heart failure)  ([place])   . Constipation - functional    controlled with miralax  . DJD (degenerative joint disease)    WHOLE SPINE  . Dyspnea   . Gait disorder   . Hypertension   . Hyperthyroidism    Had Iodine to resolve issue DX 10 YR AGO  . Hypothyroidism    Low d/t treatment of hyperthyroidism  . 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 Peripheral neuropathy   . Pneumonia   . PONV (postoperative nausea and vomiting)   . RBBB   . Renal disorder    acute kidney failure  . Restless legs syndrome 03/14/2015  . RLS (restless legs syndrome)   . Sleep apnea    DX  2009/2010 [facility] done at [facility]  . 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 Type II diabetes mellitus ([place])    diet controlled    Past Surgical History:  Procedure Laterality Date  . BACK SURGERY     6 surgeries  . CARDIAC CATHETERIZATION [name] 09/07/2015   Procedure: Right/Left Heart Cath and Coronary Angiography;  Surgeon: Peter M Martinique, MD;  Location: [place] CV LAB;  Service: Cardiovascular;  Laterality: [name];  . CHOLECYSTECTOMY    . LUMBAR LAMINECTOMY/DECOMPRESSION MICRODISCECTOMY  07/14/2012   Procedure: LUMBAR LAMINECTOMY/DECOMPRESSION MICRODISCECTOMY 2 LEVELS;  Surgeon: Eustace Moore, MD;  Location: [place] NEURO ORS;  Service: Neurosurgery;  Laterality: Left;  Lumbar two three laminectomy, left thoracic four five transpedicular diskectomy    Family History  Problem Relation Age of Onset  . Polycythemia Mother   . Diabetes Father   . Hypertension Father   . Heart disease Father   . CAD  Unknown        1 of his 4 brothers and father has CAD    Social History:  Married. Has has decline in mobility for past 6 months but was independent with walker. Per reports that he quit smoking about 32 years ago. His smoking use included Cigarettes. He has a 40.00 pack-year smoking history. He quit smokeless tobacco use about 32 years ago. He reports that he does not drink alcohol or use drugs.    Allergies  Allergen Reactions  . Other Other  (See Comments)    Anesthesia makes sick, must have antinausea medication in advance.  . Levofloxacin In D5w Itching    Possible infusion reaction 03/29/17. Tolerating oral Levaquin 03/30/17    Medications Prior to Admission  Medication Sig Dispense Refill  . aspirin EC 81 MG EC tablet Take 1 tablet (81 mg total) by mouth daily. 30 tablet 0  . baclofen (LIORESAL) 10 MG tablet Take 1 tablet (10 mg total) by mouth 3 (three) times daily. For muscle spasms 90 each 1  . escitalopram (LEXAPRO) 10 MG tablet Take 10 mg by mouth daily.  2  . gabapentin (NEURONTIN) 400 MG capsule TAKE 1 CAPSULE (400 MG TOTAL) BY MOUTH EVERY 8 (EIGHT) HOURS. 90 capsule 0  . glimepiride (AMARYL) 2 MG tablet Take 2 mg by mouth daily with breakfast.    . hydrALAZINE (APRESOLINE) 25 MG tablet Take 1 tablet (25 mg total) by mouth every 8 (eight) hours. 90 tablet 0  . levothyroxine (SYNTHROID, LEVOTHROID) 175 MCG tablet Take 175 mcg by mouth daily.  2  . losartan (COZAAR) 50 MG tablet TAKE 1 TABLET BY MOUTH ONCE DAILY. 90 tablet 3  . polyethylene glycol (MIRALAX / GLYCOLAX) packet Take 17 g by mouth daily as needed. For constipation (Patient taking differently: Take 17 g by mouth daily as needed for moderate constipation. ) 14 each 0  . potassium chloride (KLOR-CON 10) 10 MEQ tablet Take 1 tablet (10 mEq total) by mouth 2 (two) times daily. 180 tablet 3  . pramipexole (MIRAPEX) 0.5 MG tablet Take 0.5 mg by mouth 2 (two) times daily.     . tamsulosin (FLOMAX) 0.4 MG CAPS capsule Take 0.4 mg by mouth daily.     [name] torsemide (DEMADEX) 20 MG tablet Take 1 tablet (20 mg total) by mouth 2 (two) times daily. Take additional 10 mg as needed for weight gain or shortness of breath. 60 tablet 11  . TOVIAZ 8 MG TB24 tablet Take 8 mg by mouth daily.  11    Home: Home Living Family/patient expects to be discharged to:: Private residence Living Arrangements: Spouse/significant other Available Help at Discharge: Family, Available 24  hours/day Type of Home: House Home Access: [place]: One level Bathroom Shower/Tub: Multimedia programmer: Handicapped height Bathroom Accessibility: Yes Home Equipment: Environmental consultant - 2 wheels, Environmental consultant - 4 wheels, [employer] - single point, Bedside commode, Wheelchair - manual, Shower seat - built in, [employer] - tub/shower Additional Comments: 2 mod assist to stand, wife with pt alone many times  Functional History: Prior Function Level of Independence: Needs assistance Gait / Transfers Assistance Needed: Amb with RW. Requires assist from wife to stand from chair.  ADL's / Homemaking Assistance Needed: wife has to assist with donning/doffing socks and shoes  Comments: uses WC and lift chair at home Functional Status:  Mobility: Bed Mobility General bed mobility comments: in bathroom upon arrival Transfers Overall transfer level: Needs assistance Equipment used: Rolling walker (  2 wheeled), 2 person hand held assist Transfers: Sit to/from Stand Sit to Stand: Mod assist, +2 physical assistance, +2 safety/equipment General transfer comment: use of grab bar and walker to standing, assist to rise and steady, poor static standing tolerance Ambulation/Gait Ambulation/Gait assistance: Min assist, Mod assist, +2 physical assistance, +2 safety/equipment Ambulation Distance (Feet): 40 Feet Assistive device: Rolling walker (2 wheeled), 2 person hand held assist Gait Pattern/deviations: Step-to pattern, Step-through pattern, Decreased stride length, Trunk flexed, Wide base of support, Shuffle, Decreased weight shift to left, Decreased stance time - left, Decreased dorsiflexion - left General Gait Details: LLE is weak per pt but strength testing reveals RLE is weak Gait velocity: reduced Gait velocity interpretation: Below normal speed for age/gender    ADL: ADL Overall ADL's : Needs assistance/impaired Eating/Feeding: Set up, Sitting Grooming: Set up, Sitting, Oral care  (shaving) Upper Body Bathing: Minimal assistance, Sitting Lower Body Bathing: Maximal assistance, Sit to/from stand Upper Body Dressing : Minimal assistance, Sitting Lower Body Dressing: Maximal assistance, Sit to/from stand Toilet Transfer: +2 for physical assistance, Maximal assistance, BSC, RW (to stand from [facility] over toilet) Toilet Transfer Details (indicate cue type and reason): Max assist +2 for sit to stand min assist for functional mobility Toileting- Clothing Manipulation and Hygiene: Total assistance, +2 for physical assistance, Sit to/from stand Functional mobility during ADLs: Minimal assistance, Rolling walker, +2 for safety/equipment General ADL Comments: pt holds breath during exertion, 02 sats dropped to 83%, but rebounded to low 90s quickly once seated  Cognition: Cognition Overall Cognitive Status: Within Functional Limits for tasks assessed Cognition Arousal/Alertness: Awake/alert Behavior During Therapy: WFL for tasks assessed/performed Overall Cognitive Status: Within Functional Limits for tasks assessed General Comments: educated pt on benefits of more intensive therapy prior to going home as he requires extensive assist for standing  Blood pressure (!) 157/56, pulse 60, temperature 98.7 F (37.1 C), temperature source Oral, resp. rate 19, height 6\' 1"  (1.854 m), weight 128.2 kg (282 lb 10.1 oz), SpO2 (!) 88 %. Physical Exam  Nursing note and vitals reviewed. Constitutional: He is oriented to person, place, and time. He appears well-developed and well-nourished.  Up in chair--NAD and reports feeling better than past few weeks.   HENT:  Head: Normocephalic and atraumatic.  Eyes: Pupils are equal, round, and reactive to light. Conjunctivae and EOM are normal.  Neck: Normal range of motion. Neck supple.  Cardiovascular: Normal rate and regular rhythm.   Respiratory: Effort normal. No stridor. He has decreased breath sounds.  GI: Soft. Bowel sounds are normal. He  exhibits no distension. There is no tenderness. There is no rebound.  Musculoskeletal: He exhibits edema. He exhibits no tenderness.  2+ edema BLE.  Healing abrasions bilateral knees.   Neurological: He is alert and oriented to person, place, and time.  Motor: 4/5 grossly throughout  Skin: Skin is warm and dry.  Psychiatric: He has a normal mood and affect. His behavior is normal. Thought content normal.    Results for orders placed or performed during the hospital encounter of 10/02/17 (from the past 24 hour(s))  Glucose, capillary     Status: Abnormal   Collection Time: 10/04/17 12:49 PM  Result Value Ref Range   Glucose-Capillary 184 (H) 65 - 99 mg/dL  Glucose, capillary     Status: Abnormal   Collection Time: 10/04/17  4:40 PM  Result Value Ref Range   Glucose-Capillary 140 (H) 65 - 99 mg/dL  Glucose, capillary     Status: Abnormal   Collection  Time: 10/04/17  8:06 PM  Result Value Ref Range   Glucose-Capillary 146 (H) 65 - 99 mg/dL  Glucose, capillary     Status: Abnormal   Collection Time: 10/05/17  8:16 AM  Result Value Ref Range   Glucose-Capillary 142 (H) 65 - 99 mg/dL  Basic metabolic panel     Status: Abnormal   Collection Time: 10/05/17  8:23 AM  Result Value Ref Range   Sodium 139 135 - 145 mmol/L   Potassium 4.0 3.5 - 5.1 mmol/L   Chloride 104 101 - 111 mmol/L   CO2 26 22 - 32 mmol/L   Glucose, Bld 138 (H) 65 - 99 mg/dL   BUN 32 (H) 6 - 20 mg/dL   Creatinine, Ser 1.24 0.61 - 1.24 mg/dL   Calcium 8.9 8.9 - 10.3 mg/dL   GFR calc non Af Amer 56 (L) >60 mL/min   GFR calc Af Amer >60 >60 mL/min   Anion gap 9 5 - 15   No results found.  Assessment/Plan: Diagnosis: Debility Labs independently reviewed.  Records reviewed and summated above.  1. Does the need for close, 24 hr/day medical supervision in concert with the patient's rehab needs make it unreasonable for this patient to be served in a less intensive setting? Potentially 2. 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 Due to safety, disease management and patient education, does the patient require 24 hr/day rehab nursing? Potentially 4. Does the patient require coordinated care of a physician, rehab nurse, PT (1-2 hrs/day, 5 days/week) and OT (1-2 hrs/day, 5 days/week) to address physical and functional deficits in the context of the above medical diagnosis(es)? Yes Addressing deficits in the following areas: balance, endurance, locomotion, strength, transferring, bowel/bladder control, bathing, dressing, toileting and psychosocial support 5. Can the patient actively participate in an intensive therapy program of at least 3 hrs of therapy per day at least 5 days per week? Yes 6. The potential for patient to make measurable gains while on inpatient rehab is excellent 7. Anticipated functional outcomes upon discharge from inpatient rehab are supervision  with PT, supervision and min assist with OT, n/a with SLP. 8. Estimated rehab length of stay to reach the above functional goals is: 12-16 days, 9. Anticipated D/C setting: Home 10. Anticipated post D/C treatments: HH therapy and Home excercise program 11. Overall Rehab/Functional Prognosis: good  RECOMMENDATIONS: This patient's condition is appropriate for continued rehabilitative care in the following setting: Patient and wife prefer [place] and believe patient is making daily fucntional gains.  If patient continues to progress, believe this is  appropriate, otherwise may benefit from CIR. Patient has agreed to participate in recommended program. Potentially Note that insurance prior authorization may be required for reimbursement for recommended care.  Comment: Rehab Admissions Coordinator to follow up.  Delice Lesch, MD, ABPMR [place] [place], [place] 10/05/2017

## 2017-10-05 NOTE — Progress Notes (Signed)
I will follow pt's progress to assist with planning dispo . [phone]

## 2017-10-05 NOTE — Progress Notes (Signed)
Occupational Therapy Treatment Patient Details Name: Travis [name] MRN: [id number] DOB: Sep 07, 1946 Today's Date: 10/05/2017    History of present illness 71 y.o.malewith PMH of dHF, HTN, CKD, h/o Tobacco Use, DM, Recent UTI (august) presented with fever. Pt presenting with fever, shortness of breath, dyspnea on exertion associated with progressive bilateral leg edema.Pt with recent dx of lumbar stenosis with spinal compression by Dr. Jannifer Franklin.    OT comments  Pt able to ambulate from bathroom to chair. Requires 2 person moderate assistance to stand from [facility] over toilet. Pt able to complete grooming in sitting, heavily reliant on UEs in standing and unable to release for standing grooming or to manage pants. Total assist needed for pericare. Educated pt and family about benefits of rehab prior to going home. Updated d/c disposition. Will need to maximize [facility] services if pt goes home. Will continue to follow.  Follow Up Recommendations  CIR    Equipment Recommendations  None recommended by OT    Recommendations for Other Services      Precautions / Restrictions Precautions Precautions: Fall Precaution Comments: sats dropped with gait to 83% Restrictions Weight Bearing Restrictions: No       Mobility Bed Mobility               General bed mobility comments: in bathroom upon arrival  Transfers Overall transfer level: Needs assistance Equipment used: Rolling walker (2 wheeled);2 person hand held assist Transfers: Sit to/from Stand Sit to Stand: Mod assist;+2 physical assistance;+2 safety/equipment         General transfer comment: use of grab bar and walker to standing, assist to rise and steady, poor static standing tolerance    Balance Overall balance assessment: Needs assistance Sitting-balance support: Feet supported;Single extremity supported Sitting balance-Leahy Scale: Good   Postural control: Posterior lean Standing balance support: Bilateral upper  extremity supported;During functional activity Standing balance-Leahy Scale: Poor Standing balance comment: unable to release walker in standing                           ADL either performed or assessed with clinical judgement   ADL Overall ADL's : Needs assistance/impaired     Grooming: Set up;Sitting;Oral care (shaving)           Upper Body Dressing : Minimal assistance;Sitting       Toilet Transfer: +2 for physical assistance;Maximal assistance;BSC;RW (to stand from [facility] over toilet)   Toileting- Clothing Manipulation and Hygiene: Total assistance;+2 for physical assistance;Sit to/from stand       Functional mobility during ADLs: Minimal assistance;Rolling walker;+2 for safety/equipment General ADL Comments: pt holds breath during exertion, 02 sats dropped to 83%, but rebounded to low 90s quickly once seated     Vision       Perception     Praxis      Cognition Arousal/Alertness: Awake/alert Behavior During Therapy: WFL for tasks assessed/performed Overall Cognitive Status: Within Functional Limits for tasks assessed                                 General Comments: educated pt on benefits of more intensive therapy prior to going home as he requires extensive assist for standing        Exercises    Shoulder Instructions       General Comments      Pertinent Vitals/ Pain       Pain  Assessment: Faces Faces Pain Scale: Hurts even more Pain Location: back and R hip Pain Descriptors / Indicators: Guarding Pain Intervention(s): Monitored during session;Repositioned  Home Living Family/patient expects to be discharged to:: Private residence Living Arrangements: Spouse/significant other Available Help at Discharge: Family;Available 24 hours/day Type of Home: House Home Access: Ramped entrance     Home Layout: One level     Bathroom Shower/Tub: Occupational psychologist: Handicapped height Bathroom Accessibility:  Yes   Home Equipment: Environmental consultant - 2 wheels;Walker - 4 wheels;Cane - single point;Bedside commode;Wheelchair - manual;Shower seat - built in;Grab bars - tub/shower   Additional Comments: 2 mod assist to stand, wife with pt alone many times      Prior Functioning/Environment Level of Independence: Needs assistance  Gait / Transfers Assistance Needed: Amb with RW. Requires assist from wife to stand from chair.  ADL's / Homemaking Assistance Needed: wife has to assist with donning/doffing socks and shoes    Comments: uses WC and lift chair at home   Frequency  Min 2X/week        Progress Toward Goals  OT Goals(current goals can now be found in the care plan section)     Acute Rehab OT Goals Patient Stated Goal: to return to I gait and get RLE pain managed OT Goal Formulation: With patient/family Time For Goal Achievement: 10/18/17 Potential to Achieve Goals: Good  Plan Discharge plan needs to be updated    Co-evaluation    PT/OT/SLP Co-Evaluation/Treatment: Yes Reason for Co-Treatment: For patient/therapist safety   OT goals addressed during session: ADL's and self-care      AM-PAC PT "6 Clicks" Daily Activity     Outcome Measure   Help from another person eating meals?: None Help from another person taking care of personal grooming?: A Little Help from another person toileting, which includes using toliet, bedpan, or urinal?: Total Help from another person bathing (including washing, rinsing, drying)?: A Lot Help from another person to put on and taking off regular upper body clothing?: A Little Help from another person to put on and taking off regular lower body clothing?: Total 6 Click Score: 14    End of Session Equipment Utilized During Treatment: Gait belt;Rolling walker  OT Visit Diagnosis: Unsteadiness on feet (R26.81);Other abnormalities of gait and mobility (R26.89);Muscle weakness (generalized) (M62.81)   Activity Tolerance Patient tolerated treatment  well   Patient Left in chair;with call bell/phone within reach;with family/visitor present   Nurse Communication Mobility status        Time: 1610-9604 OT Time Calculation (min): 20 min  Charges: OT General Charges $OT Visit: 1 Visit OT Treatments $Self Care/Home Management : 8-22 mins  10/05/2017 [name], OTR/L Pager: [phone]   [name], Haze Boyden 10/05/2017, 11:13 AM

## 2017-10-06 ENCOUNTER — Inpatient Hospital Stay (HOSPITAL_COMMUNITY): Payer: Medicare Other

## 2017-10-06 LAB — BASIC METABOLIC PANEL
ANION GAP: 11 (ref 5–15)
Anion gap: 11 (ref 5–15)
BUN: 37 mg/dL — AB (ref 6–20)
BUN: 37 mg/dL — ABNORMAL HIGH (ref 6–20)
CALCIUM: 9.2 mg/dL (ref 8.9–10.3)
CALCIUM: 9.2 mg/dL (ref 8.9–10.3)
CHLORIDE: 100 mmol/L — AB (ref 101–111)
CO2: 28 mmol/L (ref 22–32)
CO2: 28 mmol/L (ref 22–32)
Chloride: 102 mmol/L (ref 101–111)
Creatinine, Ser: 1.31 mg/dL — ABNORMAL HIGH (ref 0.61–1.24)
Creatinine, Ser: 1.26 mg/dL — ABNORMAL HIGH (ref 0.61–1.24)
GFR calc Af Amer: >60 mL/min (ref >60)
GFR calc Af Amer: >60 mL/min (ref >60)
GFR calc non Af Amer: 55 mL/min — ABNORMAL LOW (ref >60)
GFR, EST NON AFRICAN AMERICAN: 53 mL/min — AB (ref >60)
GLUCOSE: 164 mg/dL — AB (ref 65–99)
GLUCOSE: 180 mg/dL — AB (ref 65–99)
POTASSIUM: 3.8 mmol/L (ref 3.5–5.1)
Potassium: 3.9 mmol/L (ref 3.5–5.1)
Sodium: 141 mmol/L (ref 135–145)
Sodium: 139 mmol/L (ref 135–145)

## 2017-10-06 LAB — GLUCOSE, CAPILLARY
GLUCOSE-CAPILLARY: 155 mg/dL — AB (ref 65–99)
Glucose-Capillary: 198 mg/dL — ABNORMAL HIGH (ref 65–99)
Glucose-Capillary: 202 mg/dL — ABNORMAL HIGH (ref 65–99)
Glucose-Capillary: 183 mg/dL — ABNORMAL HIGH (ref 65–99)

## 2017-10-06 MED ORDER — FUROSEMIDE 20 MG PO TABS
20.0000 mg | ORAL_TABLET | Freq: Two times a day (BID) | ORAL | Status: DC
  Administered 2017-10-06: 20 mg via ORAL
  Filled 2017-10-06: qty 1

## 2017-10-06 MED ORDER — FUROSEMIDE 10 MG/ML IJ SOLN
60.0000 mg | Freq: Once | INTRAMUSCULAR | Status: AC
  Administered 2017-10-06: 60 mg via INTRAVENOUS
  Filled 2017-10-06: qty 6

## 2017-10-06 NOTE — Progress Notes (Signed)
I met with pt and his wife at bedside to discuss his rehab venue preference. He would like to consider an inpt rehab admission pending insurance approval and bed availability. I will pen case and follow up. RN CM aware. [phone]

## 2017-10-06 NOTE — Progress Notes (Signed)
PROGRESS NOTE [facility]   Travis [name]   NTZ:[id number] DOB: [birth year]  DOA: 10/02/2017 PCP: [name], MD   Brief Narrative:  Travis [name] is a 71 year old male with past medical history of diastolic heart failure, hypertension, chronic kidney disease, diabetes. Patient presented to the emergency department after having a fever of 103 followed by an episode of nausea and vomiting. Other symptoms were shortness of breath and dyspnea with progressive bilateral edema. Upon ED evaluation patient was found to have signs of fluid overload chest x-ray, UA grossly abnormal Patient was admitted with working diagnosis of UTI and acute exacerbation of diastolic CHF. Cardiology was consulted and patient was started on IV Lasix. Patient was also started on IV antibiotics for UTI.  Subjective: Continues to improve, although oxygen saturations dropping with exertion. At rest continues to be over 91% on room air. Physical therapy evaluated and recommended CIR. Creatinine remains stable.  Assessment & Plan: UTI - > 100,000 Citrobacter  Initially treated with Rocephin, transitioned to Vantin to complete 7 days of abx.  Blood cultures no growth to date  WBC normalized  Continue to monitor  Acute on chronic diastolic CHF  Exacerbated by infectious process and aggressive hydration Cardiology recommending PFT to r/o pulmonary component - PFTs done 10/06/2017 pending results as outpatient. remains on negative fluid balance, transition IV Lasix to oral Monitor daily weight and I&Os - weight is down 15 pounds during hospital stay Continue [facility]   Acute respiratory failure with hypoxia due to volume overload  - improved  Responded well to IV lasix and O2  Patient was started on azithromycin felt to have early PNA will d/c no PNA on CXR.  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  Cr at baseline  Creatinine stable, monitor while on Lasix Renal US no acute process although shows hypoechoic lesion od 2 cm - need MRI as outpatient   OSA  CPAP at night   HTN  BP stable On hydralazine, losartan and Lasix Continue to monitor   Deconditioning CIR  DVT prophylaxis: Heparin Sq Code Status: Full Code Family Communication: None at bedside  Disposition Plan:  CIR pending approval  Consultants:   Cardiology   PMR   Procedures:   None   Antimicrobials: Anti-infectives    Start     Dose/Rate Route Frequency Ordered Stop   10/06/17 1000  cefpodoxime (VANTIN) tablet 200 mg     200 mg Oral Every 12 hours 10/05/17 1530     10/03/17 1300  cefTRIAXone (ROCEPHIN) 1 g in dextrose 5 % 50 mL IVPB  Status:  Discontinued     1 g 100 mL/hr over 30 Minutes Intravenous Every 24 hours 10/02/17 1549 10/05/17 1530   10/03/17 1000  azithromycin (ZITHROMAX) tablet 250 mg  Status:  Discontinued     250 mg Oral Daily 10/02/17 1551 10/03/17 0928   10/02/17 1600  azithromycin (ZITHROMAX) tablet 500 mg     500 mg Oral Daily 10/02/17 1551 10/03/17 0959   10/02/17 1300  cefTRIAXone (ROCEPHIN) 1 g in dextrose 5 % 50 mL IVPB     1 g 100 mL/hr over 30 Minutes Intravenous  Once 10/02/17 1254 10/02/17 1443      Objective: Vitals:   10/06/17 0827 10/06/17 0844 10/06/17 1241 10/06/17 1451  BP: (!) 163/82  (!) 126/55 [name])  151/72  Pulse:      Resp:      Temp:  97.9 F (36.6 C) 97.7 F (36.5 C)   TempSrc:  Oral Oral   SpO2:  90% 90%   Weight:      Height:        Intake/Output Summary (Last 24 hours) at 10/06/17 1530 Last data filed at 10/06/17 0442  Gross per 24 hour  Intake                0 ml  Output              700 ml  Net             -700 ml   Filed Weights   10/05/17 0400 10/05/17 0640 10/06/17 0354  Weight: 135.9 kg (299 lb 9.7 oz) 128.2 kg (282 lb 10.1 oz) 125.4 kg (276 lb 7.3 oz)    Examination:  General: Pt is alert,  awake, not in acute distress Cardiovascular: RRR, S1/S2 +, no rubs, no gallops Respiratory: Improving air entry, mild crackles at the right base. Abdominal: Soft, NT, ND, bowel sounds + Extremities: trace lower extremity edema bilaterally  Data Reviewed: I have personally reviewed following labs and imaging studies  CBC:  Recent Labs Lab 10/02/17 1005 10/03/17 0542 10/04/17 0411  WBC 14.6* 13.1* 9.1  NEUTROABS 13.0*  --   --   HGB 12.2* 11.3* 11.8*  HCT 38.3* 36.9* 37.6*  MCV 94.1 96.6 95.7  PLT 144* 137* 373*   Basic Metabolic Panel:  Recent Labs Lab 10/03/17 0542 10/04/17 0411 10/05/17 0823 10/06/17 0811 10/06/17 1226  NA 141 141 139 141 139  K 3.6 4.2 4.0 3.8 3.9  CL 100* 97* 104 102 100*  CO2 32 35* 26 28 28   GLUCOSE 169* 142* 138* 164* 180*  BUN 28* 32* 32* 37* 37*  CREATININE 1.52* 1.56* 1.24 1.31* 1.26*  CALCIUM 8.5* 8.7* 8.9 9.2 9.2   GFR: Estimated Creatinine Clearance: 73.5 mL/min (A) (by C-G formula based on SCr of 1.26 mg/dL (H)). Liver Function Tests:  Recent Labs Lab 10/02/17 1005  AST 25  ALT 19  ALKPHOS 75  BILITOT 1.0  PROT 6.9  ALBUMIN 3.7   No results for input(s): LIPASE, AMYLASE in the last 168 hours. No results for input(s): AMMONIA in the last 168 hours. Coagulation Profile: No results for input(s): INR, PROTIME in the last 168 hours. Cardiac Enzymes: No results for input(s): CKTOTAL, CKMB, CKMBINDEX, TROPONINI in the last 168 hours. BNP (last 3 results)  Recent Labs  02/12/17 1522  PROBNP 109   HbA1C: No results for input(s): HGBA1C in the last 72 hours. CBG:  Recent Labs Lab 10/05/17 1145 10/05/17 1727 10/05/17 2111 10/06/17 0740 10/06/17 1125  GLUCAP 184* 181* 185* 155* 198*   Lipid Profile: No results for input(s): CHOL, HDL, LDLCALC, TRIG, CHOLHDL, LDLDIRECT in the last 72 hours. Thyroid Function Tests: No results for input(s): TSH, T4TOTAL, FREET4, T3FREE, THYROIDAB in the last 72 hours. Anemia Panel: No  results for input(s): VITAMINB12, FOLATE, FERRITIN, TIBC, IRON, RETICCTPCT in the last 72 hours. Sepsis Labs:  Recent Labs Lab 10/02/17 1025 10/02/17 1254  LATICACIDVEN 1.10 0.81    Recent Results (from the past 240 hour(s))  Urine culture     Status: Abnormal   Collection Time: 10/02/17  9:59 AM  Result Value Ref Range Status   Specimen Description URINE, RANDOM  Final   Special Requests NONE  Final   Culture >=100,000 COLONIES/mL  CITROBACTER FREUNDII (A)  Final   Report Status 10/04/2017 FINAL  Final   Organism ID, Bacteria CITROBACTER FREUNDII (A)  Final      Susceptibility   Citrobacter freundii - MIC*    CEFAZOLIN >=64 RESISTANT Resistant     CEFTRIAXONE <=1 SENSITIVE Sensitive     CIPROFLOXACIN <=0.25 SENSITIVE Sensitive     GENTAMICIN <=1 SENSITIVE Sensitive     IMIPENEM <=0.25 SENSITIVE Sensitive     NITROFURANTOIN <=16 SENSITIVE Sensitive     TRIMETH/SULFA <=20 SENSITIVE Sensitive     PIP/TAZO <=4 SENSITIVE Sensitive     * >=100,000 COLONIES/mL CITROBACTER FREUNDII  Blood Culture (routine x 2)     Status: None (Preliminary result)   Collection Time: 10/02/17 10:05 AM  Result Value Ref Range Status   Specimen Description BLOOD RIGHT ANTECUBITAL  Final   Special Requests   Final    BOTTLES DRAWN AEROBIC AND ANAEROBIC Blood Culture adequate volume   Culture NO GROWTH 4 DAYS  Final   Report Status PENDING  Incomplete  Blood Culture (routine x 2)     Status: None (Preliminary result)   Collection Time: 10/02/17 10:20 AM  Result Value Ref Range Status   Specimen Description BLOOD LEFT HAND  Final   Special Requests   Final    BOTTLES DRAWN AEROBIC AND ANAEROBIC Blood Culture adequate volume   Culture NO GROWTH 4 DAYS  Final   Report Status PENDING  Incomplete      Radiology Studies: Dg Chest Port 1 View  Result Date: 10/06/2017 CLINICAL DATA:  Shortness of breath. EXAM: PORTABLE CHEST 1 VIEW COMPARISON:  Chest x-ray dated 10/02/2017 FINDINGS: The heart size and  pulmonary vascularity are normal. The lungs are clear except for minimal atelectasis at the lung bases, due to a shallow inspiration. Aortic atherosclerosis. No acute bone abnormality.  Previous thoracic fusion. IMPRESSION: Minimal bibasilar atelectasis, most likely secondary to a shallow inspiration. Aortic atherosclerosis. Electronically Signed   By: Lorriane Shire M.D.   On: 10/06/2017 11:30    Scheduled Meds: . aspirin EC  81 mg Oral Daily  . cefpodoxime  200 mg Oral Q12H  . escitalopram  10 mg Oral Daily  . fesoterodine  8 mg Oral Daily  . furosemide  20 mg Oral BID  . gabapentin  400 mg Oral TID  . heparin  5,000 Units Subcutaneous Q8H  . hydrALAZINE  25 mg Oral Q8H  . insulin aspart  0-9 Units Subcutaneous TID WC  . levothyroxine  175 mcg Oral QAC breakfast  . losartan  50 mg Oral Daily  . potassium chloride  10 mEq Oral BID  . pramipexole  0.5 mg Oral BID  . tamsulosin  0.4 mg Oral Daily  . traZODone  50 mg Oral QHS   Continuous Infusions:    LOS: 4 days    Time spent: Total of 15 minutes spent with pt, greater than 50% of which was spent in discussion of  treatment, counseling and coordination of care   Chipper Oman, MD Pager: Text Page via [email or web address]   If 7PM-7AM, please contact night-coverage [email or web address] 10/06/2017, 3:30 PM

## 2017-10-06 NOTE — Progress Notes (Signed)
Physical Therapy Treatment Patient Details Name: Travis [name] MRN: [id number] DOB: January 02, 1946 Today's Date: 10/06/2017    History of Present Illness 71 y.o.malewith PMH of dHF, HTN, CKD, h/o Tobacco Use, DM, Recent UTI (august) presented with fever. Pt presenting with fever, shortness of breath, dyspnea on exertion associated with progressive bilateral leg edema.Pt with recent dx of lumbar stenosis with spinal compression by Dr. Jannifer Franklin.     PT Comments    Pt able to ambulate increased distance today, 49' with RW, though antalgic gait with trunk flexed and speed very diminished.  In addition, he continues to need max A +2 for sit to stand from chair.  Pt very motivated to work with therapy to be able to go home, considering CIR at this point with encouragement from family. PT will continue to follow.   Follow Up Recommendations  CIR     Equipment Recommendations  None recommended by PT    Recommendations for Other Services Rehab consult     Precautions / Restrictions Precautions Precautions: Fall Precaution Comments: ambulated on RA, sats dropped to 85% briefly when pt stopped walking, returned to 90% with 1 min Restrictions Weight Bearing Restrictions: No    Mobility  Bed Mobility               General bed mobility comments: pt received in recliner  Transfers Overall transfer level: Needs assistance Equipment used: Rolling walker (2 wheeled) Transfers: Sit to/from Stand Sit to Stand: Max assist;+2 physical assistance         General transfer comment: pt has difficulty positioning legs under him due to B knee stiffness so has trouble with fwd wt shift. Max A +2 for power up from recliner. Pt has lift chair at home but unsure at this point even if that will be enough for him to stand with only assist of his wife  Ambulation/Gait Ambulation/Gait assistance: Min assist;+2 safety/equipment Ambulation Distance (Feet): 75 Feet Assistive device: Rolling walker (2  wheeled) Gait Pattern/deviations: Decreased weight shift to left;Decreased step length - left;Trunk flexed;Shuffle Gait velocity: reduced Gait velocity interpretation: <1.8 ft/sec, indicative of risk for recurrent falls General Gait Details: vc's for larger L step length, chair brought behind for safety. Pt motivated to increase walking distance from yesterday   Stairs            Wheelchair Mobility    Modified Rankin (Stroke Patients Only)       Balance Overall balance assessment: Needs assistance Sitting-balance support: Feet supported;Single extremity supported Sitting balance-Leahy Scale: Good     Standing balance support: Bilateral upper extremity supported;During functional activity Standing balance-Leahy Scale: Poor Standing balance comment: unable to release walker in standing                            Cognition Arousal/Alertness: Awake/alert Behavior During Therapy: WFL for tasks assessed/performed Overall Cognitive Status: Within Functional Limits for tasks assessed                                 General Comments: educated pt on benefits of more intensive therapy prior to going home as he requires extensive assist for standing      Exercises      General Comments General comments (skin integrity, edema, etc.): MD present to speak to pt at end of ambulation. Pt's daughter mentioned that her mother (caregiver for pt) was at home  with back issues that have developed from helping pt      Pertinent Vitals/Pain Pain Assessment: Faces Faces Pain Scale: Hurts little more Pain Location: back and R hip Pain Descriptors / Indicators: Grimacing;Aching Pain Intervention(s): Limited activity within patient's tolerance;Monitored during session    Home Living                      Prior Function            PT Goals (current goals can now be found in the care plan section) Acute Rehab PT Goals Patient Stated Goal: to return to  I gait and get RLE pain managed PT Goal Formulation: With patient/family Time For Goal Achievement: 10/19/17 Potential to Achieve Goals: Good Progress towards PT goals: Progressing toward goals    Frequency    Min 3X/week      PT Plan Current plan remains appropriate    Co-evaluation              AM-PAC PT "6 Clicks" Daily Activity  Outcome Measure  Difficulty turning over in bed (including adjusting bedclothes, sheets and blankets)?: Unable Difficulty moving from lying on back to sitting on the side of the bed? : Unable Difficulty sitting down on and standing up from a chair with arms (e.g., wheelchair, bedside commode, etc,.)?: Unable Help needed moving to and from a bed to chair (including a wheelchair)?: A Lot Help needed walking in hospital room?: A Lot Help needed climbing 3-5 steps with a railing? : Total 6 Click Score: 8    End of Session Equipment Utilized During Treatment: Gait belt Activity Tolerance: Patient tolerated treatment well Patient left: in chair;with call bell/phone within reach;with family/visitor present Nurse Communication: Mobility status PT Visit Diagnosis: Unsteadiness on feet (R26.81);Ataxic gait (R26.0);Muscle weakness (generalized) (M62.81);Pain Pain - Right/Left: Right Pain - part of body: Hip     Time: 0350-0938 PT Time Calculation (min) (ACUTE ONLY): 27 min  Charges:  $Gait Training: 8-22 mins $Therapeutic Activity: 8-22 mins                    G Codes:       Leighton Roach, PT  Acute Rehab Services  [place] 10/06/2017, 11:03 AM

## 2017-10-06 NOTE — Care Management Important Message (Signed)
Important Message  Patient Details  Name: Travis [name] MRN: [id number] Date of Birth: [birth year]   Medicare Important Message Given:  Yes    Nathen May 10/06/2017, 9:35 AM

## 2017-10-06 NOTE — Progress Notes (Signed)
Progress Note  Patient Name: Raylene Everts Date of Encounter: 10/06/2017  Primary Cardiologist: Harshika Mago  Subjective   Travis [name] is a 71 year old male with past medical history of diastolic heart failure, hypertension, chronic kidney disease, diabetes. Patient presented to the emergency department after having a fever of 103 followed by an episode of nausea and vomiting  He was found to have acute diastolic CHF Has improved on IV lasix  I/O = -4.4 liters so far   Inpatient rehab has been consulted . [name] is not sure if he wants to go to inpatient rehab  Inpatient Medications    Scheduled Meds: . aspirin EC  81 mg Oral Daily  . cefpodoxime  200 mg Oral Q12H  . escitalopram  10 mg Oral Daily  . fesoterodine  8 mg Oral Daily  . gabapentin  400 mg Oral TID  . heparin  5,000 Units Subcutaneous Q8H  . hydrALAZINE  25 mg Oral Q8H  . insulin aspart  0-9 Units Subcutaneous TID WC  . levothyroxine  175 mcg Oral QAC breakfast  . losartan  50 mg Oral Daily  . potassium chloride  10 mEq Oral BID  . pramipexole  0.5 mg Oral BID  . tamsulosin  0.4 mg Oral Daily  . traZODone  50 mg Oral QHS   Continuous Infusions:  PRN Meds: acetaminophen, baclofen, polyethylene glycol   Vital Signs    Vitals:   10/05/17 0816 10/05/17 2030 10/05/17 2354 10/06/17 0354  BP: (!) 157/56 132/65 (!) 127/53 (!) 142/59  Pulse:  76 67 65  Resp:  18 19 18   Temp:  98.5 F (36.9 C) 98.1 F (36.7 C) 98.2 F (36.8 C)  TempSrc:  Oral Oral Oral  SpO2: (!) 88% 91% 91% 93%  Weight:    276 lb 7.3 oz (125.4 kg)  Height:        Intake/Output Summary (Last 24 hours) at 10/06/17 0808 Last data filed at 10/06/17 0442  Gross per 24 hour  Intake                0 ml  Output              700 ml  Net             -700 ml   Filed Weights   10/05/17 0400 10/05/17 0640 10/06/17 0354  Weight: 299 lb 9.7 oz (135.9 kg) 282 lb 10.1 oz (128.2 kg) 276 lb 7.3 oz (125.4 kg)    Telemetry    NSR  - Personally  Reviewed  ECG    NSR  - Personally Reviewed  Physical Exam   GEN: No acute distress.  Frail. Generally    Neck: No JVD Cardiac: RRR, no murmurs, rubs, or gallops.  Respiratory: Clear to auscultation bilaterally. GI: Soft, obese.  MS: No edema; No deformity. Neuro:  Nonfocal  Psych: Normal affect   Labs    Chemistry Recent Labs Lab 10/02/17 1005 10/03/17 0542 10/04/17 0411 10/05/17 0823  NA 139 141 141 139  K 4.1 3.6 4.2 4.0  CL 104 100* 97* 104  CO2 29 32 35* 26  GLUCOSE 200* 169* 142* 138*  BUN 34* 28* 32* 32*  CREATININE 1.89* 1.52* 1.56* 1.24  CALCIUM 8.8* 8.5* 8.7* 8.9  PROT 6.9  --   --   --   ALBUMIN 3.7  --   --   --   AST 25  --   --   --  ALT 19  --   --   --   ALKPHOS 75  --   --   --   BILITOT 1.0  --   --   --   GFRNONAA 34* 44* 43* 56*  GFRAA 39* 51* 49* >60  ANIONGAP 6 9 9 9      Hematology Recent Labs Lab 10/02/17 1005 10/03/17 0542 10/04/17 0411  WBC 14.6* 13.1* 9.1  RBC 4.07* 3.82* 3.93*  HGB 12.2* 11.3* 11.8*  HCT 38.3* 36.9* 37.6*  MCV 94.1 96.6 95.7  MCH 30.0 29.6 30.0  MCHC 31.9 30.6 31.4  RDW 16.1* 16.5* 16.0*  PLT 144* 137* 147*    Cardiac EnzymesNo results for input(s): TROPONINI in the last 168 hours. No results for input(s): TROPIPOC in the last 168 hours.   BNP Recent Labs Lab 10/02/17 0944  BNP 169.8*     DDimer  Recent Labs Lab 10/02/17 1008  DDIMER 0.82*     Radiology    No results found.  Cardiac Studies     Patient Profile     71 y.o. male with acute on chronic diastolic congestive heart failure. He has numerous other medical problems. He has profound muscle weakness and peripheral neuropathy.  Assessment & Plan    1. Acute on chronic diastolic congestive heart failure: He seems to be doing better on current dose of diuretics. He's lost 4.4 L and 4. It's clear that his diet at home is not what it should be.  Continue IV Lasix for now. We'll redraw a basic medical profile.  2. Shortness of  breath: Shortness of breath seems to be out of proportion to the degree of diastolic dysfunction that he has. We will be getting Pulm.   function test today. For questions or updates, please contact [place] Please consult [email or web address] for contact info under Cardiology/STEMI.      Signed, Mertie Moores, MD  10/06/2017, 8:08 AM

## 2017-10-07 ENCOUNTER — Inpatient Hospital Stay (HOSPITAL_COMMUNITY): Payer: Medicare Other

## 2017-10-07 LAB — CULTURE, BLOOD (ROUTINE X 2)
CULTURE: NO GROWTH
Culture: NO GROWTH
SPECIAL REQUESTS: ADEQUATE
Special Requests: ADEQUATE

## 2017-10-07 LAB — BASIC METABOLIC PANEL
Anion gap: 11 (ref 5–15)
BUN: 43 mg/dL — AB (ref 6–20)
CO2: 29 mmol/L (ref 22–32)
CREATININE: 1.45 mg/dL — AB (ref 0.61–1.24)
Calcium: 8.8 mg/dL — ABNORMAL LOW (ref 8.9–10.3)
Chloride: 100 mmol/L — ABNORMAL LOW (ref 101–111)
GFR calc Af Amer: 54 mL/min — ABNORMAL LOW (ref >60)
GFR, EST NON AFRICAN AMERICAN: 47 mL/min — AB (ref >60)
Glucose, Bld: 142 mg/dL — ABNORMAL HIGH (ref 65–99)
Potassium: 3.7 mmol/L (ref 3.5–5.1)
SODIUM: 140 mmol/L (ref 135–145)

## 2017-10-07 LAB — PULMONARY FUNCTION TEST
DL/VA % PRED: 83 %
DL/VA: 3.97 ml/min/mmHg/L
DLCO UNC % PRED: 35 %
DLCO cor % pred: 37 %
DLCO cor: 13.57 ml/min/mmHg
DLCO unc: 12.78 ml/min/mmHg
FEF 25-75 PRE: 1.04 L/s
FEF 25-75 Post: 1.56 L/sec
FEF2575-%CHANGE-POST: 49 %
FEF2575-%PRED-POST: 59 %
FEF2575-%Pred-Pre: 39 %
FEV1-%Change-Post: 11 %
FEV1-%PRED-PRE: 42 %
FEV1-%Pred-Post: 47 %
FEV1-PRE: 1.51 L
FEV1-Post: 1.69 L
FEV1FVC-%Change-Post: 5 %
FEV1FVC-%PRED-PRE: 99 %
FEV6-%CHANGE-POST: 6 %
FEV6-%PRED-POST: 47 %
FEV6-%Pred-Pre: 45 %
FEV6-Post: 2.19 L
FEV6-Pre: 2.06 L
FEV6FVC-%CHANGE-POST: 0 %
FEV6FVC-%PRED-PRE: 105 %
FEV6FVC-%Pred-Post: 106 %
FVC-%CHANGE-POST: 5 %
FVC-%Pred-Post: 45 %
FVC-%Pred-Pre: 42 %
FVC-Post: 2.19 L
FVC-Pre: 2.07 L
POST FEV1/FVC RATIO: 77 %
POST FEV6/FVC RATIO: 100 %
PRE FEV6/FVC RATIO: 99 %
Pre FEV1/FVC ratio: 73 %
RV % PRED: 75 %
RV: 2.01 L
TLC % pred: 55 %
TLC: 4.22 L

## 2017-10-07 LAB — GLUCOSE, CAPILLARY
GLUCOSE-CAPILLARY: 156 mg/dL — AB (ref 65–99)
GLUCOSE-CAPILLARY: 236 mg/dL — AB (ref 65–99)
Glucose-Capillary: 248 mg/dL — ABNORMAL HIGH (ref 65–99)

## 2017-10-07 LAB — BLOOD GAS, ARTERIAL
Acid-Base Excess: 4.5 mmol/L — ABNORMAL HIGH (ref 0.0–2.0)
Bicarbonate: 28.8 mmol/L — ABNORMAL HIGH (ref 20.0–28.0)
Drawn by: 244901
FIO2: 21
O2 Saturation: 88.7 %
PCO2 ART: 45.1 mmHg (ref 32.0–48.0)
PH ART: 7.421 (ref 7.350–7.450)
PO2 ART: 55.9 mmHg — AB (ref 83.0–108.0)
Patient temperature: 98.6

## 2017-10-07 MED ORDER — MENTHOL 3 MG MT LOZG: 1.0000 | LOZENGE | OROMUCOSAL | Status: DC | PRN

## 2017-10-07 MED ORDER — ALBUTEROL SULFATE (2.5 MG/3ML) 0.083% IN NEBU
2.5000 mg | INHALATION_SOLUTION | Freq: Once | RESPIRATORY_TRACT | Status: AC
  Administered 2017-10-07: 2.5 mg via RESPIRATORY_TRACT

## 2017-10-07 MED ORDER — DOCUSATE SODIUM 100 MG PO CAPS
100.0000 mg | ORAL_CAPSULE | Freq: Two times a day (BID) | ORAL | Status: DC
  Administered 2017-10-07 – 2017-10-08 (×3): 100 mg via ORAL
  Filled 2017-10-07 (×3): qty 1

## 2017-10-07 MED ORDER — FUROSEMIDE 20 MG PO TABS
20.0000 mg | ORAL_TABLET | Freq: Two times a day (BID) | ORAL | Status: DC
  Administered 2017-10-07 – 2017-10-08 (×3): 20 mg via ORAL
  Filled 2017-10-07 (×2): qty 1

## 2017-10-07 MED ORDER — POTASSIUM CHLORIDE CRYS ER 10 MEQ PO TBCR
10.0000 meq | EXTENDED_RELEASE_TABLET | Freq: Two times a day (BID) | ORAL | Status: DC
  Administered 2017-10-07 – 2017-10-08 (×3): 10 meq via ORAL
  Filled 2017-10-07 (×3): qty 1

## 2017-10-07 MED ORDER — POLYETHYLENE GLYCOL 3350 17 G PO PACK
17.0000 g | PACK | Freq: Every day | ORAL | Status: DC
Start: 2017-10-07 — End: 2017-10-08
  Administered 2017-10-07 – 2017-10-08 (×2): 17 g via ORAL
  Filled 2017-10-07 (×2): qty 1

## 2017-10-07 NOTE — Progress Notes (Signed)
I have insurance approval for admit to inpt rehab but no bed available today. Pt is aware. Noted his wife had surgery this morning. I will follow up tomorrow for possible admit pending bed availability. [phone]

## 2017-10-07 NOTE — Progress Notes (Signed)
Occupational Therapy Treatment Patient Details Name: Travis [name] MRN: [id number] DOB: [birth year] Today's Date: 10/07/2017    History of present illness 71 y.o.malewith PMH of dHF, HTN, CKD, h/o Tobacco Use, DM, Recent UTI (august) presented with fever. Pt presenting with fever, shortness of breath, dyspnea on exertion associated with progressive bilateral leg edema.Pt with recent dx of lumbar stenosis with spinal compression by Dr. Jannifer Franklin.    OT comments  Pt fatigued following ambulation with PT. Agreeable to seated grooming activities. Showed pt adaptive equipment for LE ADL. Pt reports he has all AE, but did not find them to be helpful, declined further education in use, prefers to rely on his wife's assist. Will continue to follow.  Follow Up Recommendations  CIR    Equipment Recommendations  None recommended by OT    Recommendations for Other Services      Precautions / Restrictions Precautions Precautions: Fall Restrictions Weight Bearing Restrictions: No       Mobility Bed Mobility               General bed mobility comments: Pt up in recliner  Transfers Overall transfer level: Needs assistance Equipment used: Rolling walker (2 wheeled) Transfers: Sit to/from Stand Sit to Stand: +2 physical assistance;Mod assist;From elevated surface         General transfer comment: Assist to bring hips and trunk up. Pt's recliner built up with pillows and blankets for a higher surface but pt still requires assist and incr time and effort to come to stand    Balance Overall balance assessment: Needs assistance Sitting-balance support: Feet supported;No upper extremity supported Sitting balance-Leahy Scale: Good     Standing balance support: Bilateral upper extremity supported;During functional activity Standing balance-Leahy Scale: Poor Standing balance comment: walker and min assist for static standing                           ADL either  performed or assessed with clinical judgement   ADL Overall ADL's : Needs assistance/impaired     Grooming: Wash/dry hands;Wash/dry face;Oral care;Sitting                                 General ADL Comments: Pt is agreeable to going to CIR for further rehab.     Vision       Perception     Praxis      Cognition Arousal/Alertness: Awake/alert Behavior During Therapy: WFL for tasks assessed/performed Overall Cognitive Status: Within Functional Limits for tasks assessed                                          Exercises     Shoulder Instructions       General Comments      Pertinent Vitals/ Pain       Pain Assessment: Faces Faces Pain Scale: Hurts a little bit Pain Location: back Pain Descriptors / Indicators: Grimacing Pain Intervention(s): Repositioned  Home Living Family/patient expects to be discharged to:: Private residence Living Arrangements: Spouse/significant other Available Help at Discharge: Family;Available 24 hours/day Type of Home: House                              Lives With: Spouse    Prior  Functioning/Environment              Frequency  Min 2X/week        Progress Toward Goals  OT Goals(current goals can now be found in the care plan section)  Progress towards OT goals: Progressing toward goals  Acute Rehab OT Goals Patient Stated Goal: to return to I gait and get RLE pain managed OT Goal Formulation: With patient/family Time For Goal Achievement: 10/18/17 Potential to Achieve Goals: Good  Plan Discharge plan remains appropriate    Co-evaluation                 AM-PAC PT "6 Clicks" Daily Activity     Outcome Measure   Help from another person eating meals?: None Help from another person taking care of personal grooming?: A Little Help from another person toileting, which includes using toliet, bedpan, or urinal?: A Lot Help from another person bathing (including  washing, rinsing, drying)?: A Lot Help from another person to put on and taking off regular upper body clothing?: A Little Help from another person to put on and taking off regular lower body clothing?: Total 6 Click Score: 15    End of Session Equipment Utilized During Treatment: Gait belt;Rolling walker  OT Visit Diagnosis: Unsteadiness on feet (R26.81);Other abnormalities of gait and mobility (R26.89);Muscle weakness (generalized) (M62.81)   Activity Tolerance Patient tolerated treatment well   Patient Left in chair;with call bell/phone within reach;with nursing/sitter in room   Nurse Communication          Time: 1610-9604 OT Time Calculation (min): 18 min  Charges: OT General Charges $OT Visit: 1 Visit OT Treatments $Self Care/Home Management : 8-22 mins  10/07/2017 [name], OTR/L Pager: [phone]   [name], Haze Boyden 10/07/2017, 4:15 PM

## 2017-10-07 NOTE — Progress Notes (Signed)
Physical Therapy Treatment Patient Details Name: Travis [name] MRN: [id number] DOB: 1946/05/22 Today's Date: 10/07/2017    History of Present Illness 71 y.o.malewith PMH of dHF, HTN, CKD, h/o Tobacco Use, DM, Recent UTI (august) presented with fever. Pt presenting with fever, shortness of breath, dyspnea on exertion associated with progressive bilateral leg edema.Pt with recent dx of lumbar stenosis with spinal compression by Dr. Jannifer Franklin.     PT Comments    Pt making steady progress. Continue to feel that pt need CIR prior to return home. Pt's wife now hospitalized for gall bladder surgery. Pt motivated to work toward incr independence.   Follow Up Recommendations  CIR     Equipment Recommendations  None recommended by PT    Recommendations for Other Services       Precautions / Restrictions Precautions Precautions: Fall Restrictions Weight Bearing Restrictions: No    Mobility  Bed Mobility               General bed mobility comments: Pt up in recliner  Transfers Overall transfer level: Needs assistance Equipment used: Rolling walker (2 wheeled) Transfers: Sit to/from Stand Sit to Stand: +2 physical assistance;Mod assist;From elevated surface         General transfer comment: Assist to bring hips and trunk up. Pt's recliner built up with pillows and blankets for a higher surface but pt still requires assist and incr time and effort to come to stand  Ambulation/Gait Ambulation/Gait assistance: Min assist;+2 safety/equipment;Mod assist Ambulation Distance (Feet): 120 Feet Assistive device: Rolling walker (2 wheeled) Gait Pattern/deviations: Decreased step length - left;Trunk flexed;Shuffle;Decreased dorsiflexion - right;Step-through pattern;Decreased step length - right Gait velocity: reduced Gait velocity interpretation: Below normal speed for age/gender General Gait Details: Pt amb on 2L of O2 with SpO2 >90%. Pt with rt leg with decr ground clearance and  scuffing across floor. Verbal cues to stand more upright. Incr fatigue as distance incr with pt requiring incr assist.   Stairs            Wheelchair Mobility    Modified Rankin (Stroke Patients Only)       Balance Overall balance assessment: Needs assistance Sitting-balance support: Feet supported;No upper extremity supported Sitting balance-Leahy Scale: Good     Standing balance support: Bilateral upper extremity supported;During functional activity Standing balance-Leahy Scale: Poor Standing balance comment: walker and min assist for static standing                            Cognition Arousal/Alertness: Awake/alert Behavior During Therapy: WFL for tasks assessed/performed Overall Cognitive Status: Within Functional Limits for tasks assessed                                        Exercises      General Comments        Pertinent Vitals/Pain Pain Assessment: Faces Faces Pain Scale: Hurts a little bit Pain Location: back Pain Descriptors / Indicators: Grimacing Pain Intervention(s): Limited activity within patient's tolerance;Monitored during session    Home Living                      Prior Function            PT Goals (current goals can now be found in the care plan section) Progress towards PT goals: Progressing toward goals  Frequency    Min 3X/week      PT Plan Current plan remains appropriate    Co-evaluation              AM-PAC PT "6 Clicks" Daily Activity  Outcome Measure  Difficulty turning over in bed (including adjusting bedclothes, sheets and blankets)?: Unable Difficulty moving from lying on back to sitting on the side of the bed? : Unable Difficulty sitting down on and standing up from a chair with arms (e.g., wheelchair, bedside commode, etc,.)?: Unable Help needed moving to and from a bed to chair (including a wheelchair)?: A Lot Help needed walking in hospital room?: A Lot Help  needed climbing 3-5 steps with a railing? : Total 6 Click Score: 8    End of Session Equipment Utilized During Treatment: Gait belt;Oxygen Activity Tolerance: Patient tolerated treatment well Patient left: in chair;with call bell/phone within reach Nurse Communication: Mobility status PT Visit Diagnosis: Unsteadiness on feet (R26.81);Ataxic gait (R26.0);Muscle weakness (generalized) (M62.81);Pain Pain - part of body:  (back)     Time: 2671-2458 PT Time Calculation (min) (ACUTE ONLY): 19 min  Charges:  $Gait Training: 8-22 mins                    G Codes:       [facility] PT [place] 10/07/2017, 2:31 PM

## 2017-10-07 NOTE — Progress Notes (Signed)
Progress Note  Patient Name: Travis [name] Date of Encounter: 10/07/2017  Primary Cardiologist: Tashyra Adduci  Subjective   71 with hx of acute on chronic diastolic congestive heart failure, hypertension, chronic kidney disease. He presented with hypoxemia and volume overload. He is diuresed nicely.  His wife was brought to the hospital yesterday with chest pain and nausea. She was found have gallstones and is going to have surgery today.  His oxygenation is still marginal. He's on 2 L nasal cannula with an O2 sat of 90-91 eating quietly at rest.  Inpatient Medications    Scheduled Meds: . aspirin EC  81 mg Oral Daily  . cefpodoxime  200 mg Oral Q12H  . docusate sodium  100 mg Oral BID  . escitalopram  10 mg Oral Daily  . fesoterodine  8 mg Oral Daily  . furosemide  20 mg Oral BID  . gabapentin  400 mg Oral TID  . heparin  5,000 Units Subcutaneous Q8H  . hydrALAZINE  25 mg Oral Q8H  . insulin aspart  0-9 Units Subcutaneous TID WC  . levothyroxine  175 mcg Oral QAC breakfast  . polyethylene glycol  17 g Oral Daily  . potassium chloride  10 mEq Oral BID  . pramipexole  0.5 mg Oral BID  . tamsulosin  0.4 mg Oral Daily  . traZODone  50 mg Oral QHS   Continuous Infusions:  PRN Meds: acetaminophen, baclofen, menthol-cetylpyridinium   Vital Signs    Vitals:   10/06/17 1451 10/06/17 2043 10/07/17 0035 10/07/17 0534  BP: (!) 151/72 129/67 124/63 131/60  Pulse:   64 60  Resp:  17 16 17   Temp:  97.7 F (36.5 C)  97.7 F (36.5 C)  TempSrc:  Oral  Oral  SpO2:  98% 97% 99%  Weight:    280 lb (127 kg)  Height:        Intake/Output Summary (Last 24 hours) at 10/07/17 0902 Last data filed at 10/07/17 0815  Gross per 24 hour  Intake              180 ml  Output             1450 ml  Net            -1270 ml   Filed Weights   10/05/17 0640 10/06/17 0354 10/07/17 0534  Weight: 282 lb 10.1 oz (128.2 kg) 276 lb 7.3 oz (125.4 kg) 280 lb (127 kg)    Telemetry    NSR  -  Personally Reviewed  ECG     NSR  - Personally Reviewed  Physical Exam   GEN: No acute distress.  Obese , Gen. He appears weak. Neck: No JVD Cardiac: RR, no murmurs, rubs, or gallops.  Respiratory: Clear to auscultation bilaterally. GI: Soft, nontender, non-distended  MS: No edema; No deformity. Wearing compression hose Neuro:  Nonfocal  Psych: Normal affect   Labs    Chemistry Recent Labs Lab 10/02/17 1005  10/06/17 0811 10/06/17 1226 10/07/17 0337  NA 139  < > 141 139 140  K 4.1  < > 3.8 3.9 3.7  CL 104  < > 102 100* 100*  CO2 29  < > 28 28 29   GLUCOSE 200*  < > 164* 180* 142*  BUN 34*  < > 37* 37* 43*  CREATININE 1.89*  < > 1.31* 1.26* 1.45*  CALCIUM 8.8*  < > 9.2 9.2 8.8*  PROT 6.9  --   --   --   --  ALBUMIN 3.7  --   --   --   --   AST 25  --   --   --   --   ALT 19  --   --   --   --   ALKPHOS 75  --   --   --   --   BILITOT 1.0  --   --   --   --   GFRNONAA 34*  < > 53* 55* 47*  GFRAA 39*  < > >60 >60 54*  ANIONGAP 6  < > 11 11 11   < > = values in this interval not displayed.   Hematology Recent Labs Lab 10/02/17 1005 10/03/17 0542 10/04/17 0411  WBC 14.6* 13.1* 9.1  RBC 4.07* 3.82* 3.93*  HGB 12.2* 11.3* 11.8*  HCT 38.3* 36.9* 37.6*  MCV 94.1 96.6 95.7  MCH 30.0 29.6 30.0  MCHC 31.9 30.6 31.4  RDW 16.1* 16.5* 16.0*  PLT 144* 137* 147*    Cardiac EnzymesNo results for input(s): TROPONINI in the last 168 hours. No results for input(s): TROPIPOC in the last 168 hours.   BNP Recent Labs Lab 10/02/17 0944  BNP 169.8*     DDimer  Recent Labs Lab 10/02/17 1008  DDIMER 0.82*     Radiology    Dg Chest Port 1 View  Result Date: 10/06/2017 CLINICAL DATA:  Shortness of breath. EXAM: PORTABLE CHEST 1 VIEW COMPARISON:  Chest x-ray dated 10/02/2017 FINDINGS: The heart size and pulmonary vascularity are normal. The lungs are clear except for minimal atelectasis at the lung bases, due to a shallow inspiration. Aortic atherosclerosis. No acute  bone abnormality.  Previous thoracic fusion. IMPRESSION: Minimal bibasilar atelectasis, most likely secondary to a shallow inspiration. Aortic atherosclerosis. Electronically Signed   By: Lorriane Shire M.D.   On: 10/06/2017 11:30    Cardiac Studies     Patient Profile     71 y.o. male ith acute on chronic diastolic congestive heart failure.  Assessment & Plan    1. Acute on chronic diastolic congestive heart failure: He has been adequately diuresed. His creatinine has bumped little bit. At this point we can return to his normal home dose of Lasix.  His hypoxemia has not really improved with adequate diuresis. I suspect that he may have a pulmonary issue. We are still waiting to get pulmonary function tests performed.  2.  CKD - further plans per IM   3.  HTN:  Stable   4.  Hypoxemia:   Still hypoxemic - on 2 L [place] Getting PFTs. May need further pulmonary evaluation.  ? Neuromuscular disorder - although no abnormality was found by Dr. Jannifer Franklin .   For questions or updates, please contact [place] Please consult [email or web address] for contact info under Cardiology/STEMI.      Signed, Mertie Moores, MD  10/07/2017, 9:02 AM

## 2017-10-07 NOTE — Progress Notes (Signed)
NIF was -60. Pt has good effort.

## 2017-10-07 NOTE — PMR Pre-admission (Signed)
PMR Admission Coordinator Pre-Admission Assessment  Patient: Travis [name] is an 71 y.o., male MRN: [id number] DOB: May 03, 1946 Height: 6\' 1"  (185.4 cm) Weight: 128.1 kg (282 lb 4.8 oz)              Insurance Information HMO:    PPO: yes     PCP:      IPA:      80/20:      OTHER: Medicare advantage plan PRIMARY: Blue Medicare      Policy#: [id number]      Subscriber: pt CM Name: Cedric      Phone#: [phone]     Fax#: [phone] Pre-Cert#: [id number]    Updates due in 7 days  Employer:self Benefits:  Phone #: [phone]     Name: 10/07/2017 Eff. Date: 12/29/2016     Deduct: none      Out of Pocket Max: [phone]      Life Max: none CIR: $300 co pay per day days 1 through 6      SNF: no co pay days 1-20; $167.50 co pay per day days 21-60; no co pay days 61-100 Outpatient: $40 co pay per visit     Co-Pay: visits per medical neccesity Home Health: 100%      Co-Pay: visits per medical neccesity DME: 80%     Co-Pay: 20% Providers: in network  SECONDARY: none        Medicaid Application Date:       Case Manager:  Disability Application Date:       Case Worker:   Emergency Facilities manager Information    Name Relation Home Work Mobile   Dadamo,Carolyn Spouse [phone]  [phone]   Papciak,Leslie Daughter   [phone]     Current Medical History  Patient Admitting Diagnosis: debility  History of Present Illness: HPI: Travis [name] is a 71 y.o. 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   He was treated with IV diuresis for acute on chronic diastolic CHF with improvement but continues to have hypoxia with activity. Cardiology question pulmonary component and PFTs revealed severe obstructive lung disease. He has diuresed 6 L and renal status has  been stable.  Wife admitted yesterday for GB attack requiring surgery.  He has had recent workup revealing severe L4-5 spinal stenosis and was being referred to Dr. Ronnald Ramp for surgical consideration.    He has had decline in mobility as well as decline in ability to carry out ADLs CIR recommended for follow up therapy.    Past Medical History  Past Medical History:  Diagnosis Date  . Cervical myelopathy (HCC)    with myelomalacia at C5-6 and C3-4  . CHF (congestive heart failure) ([place])   . Constipation - functional    controlled with miralax  . DJD (degenerative joint disease)    WHOLE SPINE  . Dyspnea   . Gait disorder   . Hypertension   . Hyperthyroidism    Had Iodine to resolve issue DX 10 YR AGO  . Hypothyroidism    Low d/t treatment of hyperthyroidism  . 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 Peripheral neuropathy   . Pneumonia   . PONV (postoperative nausea and vomiting)   . RBBB   . Renal disorder    acute kidney failure  . Restless legs syndrome 03/14/2015  .  RLS (restless legs syndrome)   . Sleep apnea    DX  2009/2010 [facility] done at [facility]  . 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 Type II diabetes mellitus (HCC)    diet controlled    Family History  family history includes CAD in his unknown relative; Diabetes in his father; Heart disease in his father; Hypertension in his father; Polycythemia in his mother.  Prior Rehab/Hospitalizations:  Has the patient had major surgery during 100 days prior to admission? No  CIR 2015  Current Medications   Current Facility-Administered Medications:  .  acetaminophen (TYLENOL) tablet 650 mg, 650 mg, Oral, Q6H PRN, Blount, Xenia T, NP, 650 mg at 10/02/17 2103 .  aspirin EC tablet 81 mg, 81 mg, Oral, Daily, Buriev, Arie Sabina, MD, 81 mg at 10/08/17 1021 .  baclofen (LIORESAL) tablet 10 mg, 10 mg, Oral, TID PRN, Daleen Bo, Arie Sabina, MD .  cefpodoxime Bennie Pierini) tablet 200 mg, 200 mg, Oral, Q12H, Patrecia Pour, Edwin, MD, 200 mg at  10/08/17 1022 .  docusate sodium (COLACE) capsule 100 mg, 100 mg, Oral, BID, Lavina Hamman, MD, 100 mg at 10/08/17 1020 .  escitalopram (LEXAPRO) tablet 10 mg, 10 mg, Oral, Daily, Buriev, Arie Sabina, MD, 10 mg at 10/08/17 1022 .  fesoterodine (TOVIAZ) tablet 8 mg, 8 mg, Oral, Daily, Buriev, Arie Sabina, MD, 8 mg at 10/08/17 1023 .  furosemide (LASIX) tablet 20 mg, 20 mg, Oral, BID, Lavina Hamman, MD, 20 mg at 10/08/17 1033 .  gabapentin (NEURONTIN) capsule 400 mg, 400 mg, Oral, TID, Buriev, Arie Sabina, MD, 400 mg at 10/08/17 1022 .  heparin injection 5,000 Units, 5,000 Units, Subcutaneous, Q8H, Patrecia Pour, Christean Grief, MD, 5,000 Units at 10/08/17 [phone] .  hydrALAZINE (APRESOLINE) tablet 25 mg, 25 mg, Oral, Q8H, Buriev, Arie Sabina, MD, 25 mg at 10/08/17 0609 .  insulin aspart (novoLOG) injection 0-9 Units, 0-9 Units, Subcutaneous, TID WC, Buriev, Arie Sabina, MD, 2 Units at 10/08/17 1034 .  levothyroxine (SYNTHROID, LEVOTHROID) tablet 175 mcg, 175 mcg, Oral, QAC breakfast, Kinnie Feil, MD, 175 mcg at 10/08/17 0609 .  menthol-cetylpyridinium (CEPACOL) lozenge 3 mg, 1 lozenge, Oral, PRN, Lavina Hamman, MD .  polyethylene glycol (MIRALAX / GLYCOLAX) packet 17 g, 17 g, Oral, Daily, Lavina Hamman, MD, 17 g at 10/08/17 1026 .  potassium chloride (K-DUR,KLOR-CON) CR tablet 10 mEq, 10 mEq, Oral, BID, Lavina Hamman, MD, 10 mEq at 10/08/17 1022 .  pramipexole (MIRAPEX) tablet 0.5 mg, 0.5 mg, Oral, BID, Buriev, Arie Sabina, MD, 0.5 mg at 10/08/17 1021 .  tamsulosin (FLOMAX) capsule 0.4 mg, 0.4 mg, Oral, Daily, Buriev, Arie Sabina, MD, 0.4 mg at 10/08/17 1023 .  traZODone (DESYREL) tablet 50 mg, 50 mg, Oral, QHS, Blount, Xenia T, NP, 50 mg at 10/07/17 2142  Patients Current Diet: Diet 2 gram sodium Room service appropriate? Yes; Fluid consistency: Thin Diet - low sodium heart healthy Diet Carb Modified  Precautions / Restrictions Precautions Precautions: Fall Precaution Comments: ambulated on RA, sats  dropped to 85% briefly when pt stopped walking, returned to 90% with 1 min Restrictions Weight Bearing Restrictions: No   Has the patient had 2 or more falls or a fall with injury in the past year?Yes. 3 falls in 1 week due to legs weak and buckling underneath him when walking  Prior Activity Level Community (5-7x/wk): used RW pta. went to his insurance office 8 am until 1 or 2 daily. Wife drives pt to the  office daily and he has a desk/sedentary job. Wife assisted with lower body adls.  Home Assistive Devices / Equipment Home Assistive Devices/Equipment: CPAP Home Equipment: Environmental consultant - 2 wheels, Environmental consultant - 4 wheels, [place] - single point, Bedside commode, Wheelchair - manual, Shower seat - built in, [employer] - tub/shower Patient has old CPAP machine over 73 years old. Had an appointment to be assessed for new CPAP and this one is currently broken. May need to use hospital CPAP until another one is obtained. Daughter to call MD office to inquire into new home CPAP.  Prior Device Use: Indicate devices/aids used by the patient prior to current illness, exacerbation or injury? Walker and lift chair and wheelchair  Prior Functional Level Prior Function Level of Independence: Needs assistance Gait / Transfers Assistance Needed: Amb with RW. Requires assist from wife to stand from chair.  ADL's / Homemaking Assistance Needed: wife has to assist with donning/doffing socks and shoes  Comments: uses WC and lift chair at home  Self Care: Did the patient need help bathing, dressing, using the toilet or eating?  Needed some help  Indoor Mobility: Did the patient need assistance with walking from room to room (with or without device)? Needed some help  Stairs: Did the patient need assistance with internal or external stairs (with or without device)? Needed some help  Functional Cognition: Did the patient need help planning regular tasks such as shopping or remembering to take medications? Needed some  help  Current Functional Level Cognition  Overall Cognitive Status: Within Functional Limits for tasks assessed General Comments: educated pt on benefits of more intensive therapy prior to going home as he requires extensive assist for standing    Extremity Assessment (includes Sensation/Coordination)  Upper Extremity Assessment: Overall WFL for tasks assessed  Lower Extremity Assessment: RLE deficits/detail RLE Deficits / Details: 4- strength RLE hip and ankle, knee 4+ext and flex 3+ RLE Coordination: decreased fine motor, decreased gross motor    ADLs  Overall ADL's : Needs assistance/impaired Eating/Feeding: Set up, Sitting Grooming: Wash/dry hands, Wash/dry face, Oral care, Sitting Upper Body Bathing: Minimal assistance, Sitting Lower Body Bathing: Maximal assistance, Sit to/from stand Upper Body Dressing : Minimal assistance, Sitting Lower Body Dressing: Maximal assistance, Sit to/from stand Toilet Transfer: +2 for physical assistance, Maximal assistance, BSC, RW (to stand from [facility] over toilet) Toilet Transfer Details (indicate cue type and reason): Max assist +2 for sit to stand min assist for functional mobility Toileting- Clothing Manipulation and Hygiene: Total assistance, +2 for physical assistance, Sit to/from stand Functional mobility during ADLs: Minimal assistance, Rolling walker, +2 for safety/equipment General ADL Comments: Pt is agreeable to going to CIR for further rehab.    Mobility  General bed mobility comments: Pt up in recliner    Transfers  Overall transfer level: Needs assistance Equipment used: Rolling walker (2 wheeled) Transfers: Sit to/from Stand Sit to Stand: +2 physical assistance, Mod assist, From elevated surface General transfer comment: Assist to bring hips and trunk up. Pt's recliner built up with pillows and blankets for a higher surface but pt still requires assist and incr time and effort to come to stand    Ambulation / Gait / Stairs /  Wheelchair Mobility  Ambulation/Gait Ambulation/Gait assistance: Min assist, +2 safety/equipment, Mod assist Ambulation Distance (Feet): 120 Feet Assistive device: Rolling walker (2 wheeled) Gait Pattern/deviations: Decreased step length - left, Trunk flexed, Shuffle, Decreased dorsiflexion - right, Step-through pattern, Decreased step length - right General Gait Details: Pt amb on  2L of O2 with SpO2 >90%. Pt with rt leg with decr ground clearance and scuffing across floor. Verbal cues to stand more upright. Incr fatigue as distance incr with pt requiring incr assist. Gait velocity: reduced Gait velocity interpretation: Below normal speed for age/gender    Posture / Balance Balance Overall balance assessment: Needs assistance Sitting-balance support: Feet supported, No upper extremity supported Sitting balance-Leahy Scale: Good Postural control: Posterior lean Standing balance support: Bilateral upper extremity supported, During functional activity Standing balance-Leahy Scale: Poor Standing balance comment: walker and min assist for static standing    Special needs/care consideration BiPAP/CPAP  yes CPM  [name] Continuous Drip IV  [name] Dialysis n/a Life Vest  [name] Oxygen used home CPAP at HS, was not on home oxygen pta Special Bed  [name] Trach Size  [name] Wound Vac n/a Skin intact  . Dry flaky skin                            Bowel mgmt: continent LBM 10/9 continent Bladder mgmt: external catheter Diabetic mgmt yes Type II Home CPAP broke in hospital on night of 10/07/17   Previous Home Environment Living Arrangements: Spouse/significant other  Lives With: Spouse Available Help at Discharge: Family, Available 24 hours/day Type of Home: House Home Layout: One level Home Access: Ramped entrance Bathroom Shower/Tub: Multimedia programmer: Handicapped height Bathroom Accessibility: Yes [place]: No Additional Comments: 2 mod assist to stand, wife with pt alone many  times  Discharge Living Setting Plans for Discharge Living Setting: Patient's home, Lives with (comment) (wife) Type of Home at Discharge: House Discharge Home Layout: One level Discharge Home Access: [place] entrance Discharge Bathroom Shower/Tub: Walk-in shower Discharge Bathroom Toilet: Handicapped height Discharge Bathroom Accessibility: Yes How Accessible: Accessible via walker Does the patient have any problems obtaining your medications?: No  Social/Family/Support Systems Patient Roles: Spouse, Parent (owns Human resources officer and son works with pt) Contact Information: Dawaun Brancato, wife Anticipated Caregiver: wife, daughter, daughter in laws and son Anticipated Caregiver's Contact Information: see above Ability/Limitations of Caregiver: wife had gallbladder surgery 10/07/2017 unexpectdely Caregiver Availability: 24/7 Discharge Plan Discussed with Primary Caregiver: Yes Is Caregiver In Agreement with Plan?: Yes Does Caregiver/Family have Issues with Lodging/Transportation while Pt is in Rehab?: No  Goals/Additional Needs Patient/Family Goal for Rehab: supervision PT, supervision to min assist adls Expected length of stay: ELOS 12-16 days; before wife unexpectedly had surgery 10/07/17 pt wanted short LOS, may now change Pt/Family Agrees to Admission and willing to participate: Yes Program Orientation Provided & Reviewed with Pt/Caregiver Including Roles  & Responsibilities: Yes  Decrease burden of Care through IP rehab admission: n/a  Possible need for SNF placement upon discharge: not anticipated  Patient Condition: This patient's medical and functional status has changed since the consult dated 10/05/2017 in which the Rehabilitation Physician determined and documented that the patient was potentially appropriate for intensive rehabilitative care in an inpatient rehabilitation facility. Issues have been addressed and update has been discussed with Dr. Letta Pate  and patient now  appropriate for inpatient rehabilitation. Patient initially preferred discharged home but, with wife's unexpected surgical procedure and pt's slow functional progress, they have requested an inpt acute rehabilitation admission. Will admit to inpatient rehab today.   Preadmission Screen Completed By:  Cleatrice Burke, 10/08/2017 12:13 PM ______________________________________________________________________   Discussed status with Dr. Letta Pate on 10/08/2017 at  1139 and received telephone approval for admission today.  Admission Coordinator:  Julious Payer,  Audelia Acton, time 7290 Date 10/08/2017.

## 2017-10-07 NOTE — Progress Notes (Signed)
PROGRESS NOTE [facility]   Travis [name]   XLK:[id number] DOB: Aug 12, 1946  DOA: 10/02/2017 PCP: [name], MD   Brief Narrative:  Travis [name] is a 71 year old male with past medical history of diastolic heart failure, hypertension, chronic kidney disease, diabetes. Patient presented to the emergency department after having a fever of 103 followed by an episode of nausea and vomiting. Other symptoms were shortness of breath and dyspnea with progressive bilateral edema. Upon ED evaluation patient was found to have signs of fluid overload chest x-ray, UA grossly abnormal Patient was admitted with working diagnosis of UTI and acute exacerbation of diastolic CHF. Cardiology was consulted and patient was started on IV Lasix. Patient was also started on IV antibiotics for UTI.  Subjective: Remains hypoxic, has multiple episodes of coughing while I was interviewing. No chest pain and abdominal pain. Feeling overall better.  Assessment & Plan: UTI - > 100,000 Citrobacter  Initially treated with Rocephin, transitioned to Vantin to complete 7 days of abx.  Blood cultures no growth to date  WBC normalized  Continue to monitor  Acute on chronic diastolic CHF  Exacerbated by infectious process and aggressive hydration Cardiology recommending PFT to r/o pulmonary component - PFTs done 10/06/2017 pending results as outpatient. remains on negative fluid balance, transition to home Lasix Monitor daily weight and I&Os - weight is down 15 pounds during hospital stay Continue [facility]   Acute respiratory failure with hypoxia due to volume overload  - improved  Responded well to IV lasix and O2  Patient was started on azithromycin felt to have early PNA will d/c no PNA on CXR.  Wean O2 as tolerated  O2 saturation dropping to 83% with ambulation - wonder if there is any lung components  PFT reported, NIF is actually 60 adequate.  Acute renal failure on CKD stage III - stable -  Cardiorenal syndrome  Cr at baseline  Creatinine stable, monitor while on Lasix Renal US no acute process although shows hypoechoic lesion od 2 cm - need MRI as outpatient   OSA  CPAP at night   HTN  BP stable On hydralazine, losartan and Lasix Continue to monitor   Deconditioning CIR  DVT prophylaxis: Heparin Sq Code Status: Full Code Family Communication: None at bedside  Disposition Plan:  CIR pending approval  Consultants:   Cardiology   PMR   Procedures:   None   Antimicrobials: Anti-infectives    Start     Dose/Rate Route Frequency Ordered Stop   10/06/17 1000  cefpodoxime (VANTIN) tablet 200 mg     200 mg Oral Every 12 hours 10/05/17 1530     10/03/17 1300  cefTRIAXone (ROCEPHIN) 1 g in dextrose 5 % 50 mL IVPB  Status:  Discontinued     1 g 100 mL/hr over 30 Minutes Intravenous Every 24 hours 10/02/17 1549 10/05/17 1530   10/03/17 1000  azithromycin (ZITHROMAX) tablet 250 mg  Status:  Discontinued     250 mg Oral Daily 10/02/17 1551 10/03/17 0928   10/02/17 1600  azithromycin (ZITHROMAX) tablet 500 mg     500 mg Oral Daily 10/02/17 1551 10/03/17 0959   10/02/17 1300  cefTRIAXone (ROCEPHIN) 1 g in dextrose 5 % 50 mL IVPB     1 g 100 mL/hr over 30 Minutes Intravenous  Once 10/02/17 1254 10/02/17 1443      Objective: Vitals:   10/06/17 2043 10/07/17 0035 10/07/17 0534 10/07/17 1213  BP: 129/67 124/63 131/60 (!) 151/61  Pulse:  64 60 73  Resp: 17 16 17 18   Temp: 97.7 F (36.5 C)  97.7 F (36.5 C) (!) 97.2 F (36.2 C)  TempSrc: Oral  Oral Oral  SpO2: 98% 97% 99% 92%  Weight:   127 kg (280 lb)   Height:        Intake/Output Summary (Last 24 hours) at 10/07/17 1815 Last data filed at 10/07/17 1809  Gross per 24 hour  Intake              720 ml  Output             1050 ml  Net             -330 ml   Filed Weights   10/05/17 0640 10/06/17 0354 10/07/17 0534  Weight: 128.2 kg (282 lb 10.1 oz) 125.4 kg (276 lb 7.3 oz) 127 kg (280 lb)     Examination:  General: Pt is alert, awake, not in acute distress Cardiovascular: RRR, S1/S2 +, no rubs, no gallops Respiratory: Improving air entry, mild crackles at the right base. Abdominal: Soft, NT, ND, bowel sounds + Extremities: trace lower extremity edema bilaterally  Data Reviewed: I have personally reviewed following labs and imaging studies  CBC:  Recent Labs Lab 10/02/17 1005 10/03/17 0542 10/04/17 0411  WBC 14.6* 13.1* 9.1  NEUTROABS 13.0*  --   --   HGB 12.2* 11.3* 11.8*  HCT 38.3* 36.9* 37.6*  MCV 94.1 96.6 95.7  PLT 144* 137* 637*   Basic Metabolic Panel:  Recent Labs Lab 10/04/17 0411 10/05/17 0823 10/06/17 0811 10/06/17 1226 10/07/17 0337  NA 141 139 141 139 140  K 4.2 4.0 3.8 3.9 3.7  CL 97* 104 102 100* 100*  CO2 35* 26 28 28 29   GLUCOSE 142* 138* 164* 180* 142*  BUN 32* 32* 37* 37* 43*  CREATININE 1.56* 1.24 1.31* 1.26* 1.45*  CALCIUM 8.7* 8.9 9.2 9.2 8.8*   GFR: Estimated Creatinine Clearance: 64.3 mL/min (A) (by C-G formula based on SCr of 1.45 mg/dL (H)). Liver Function Tests:  Recent Labs Lab 10/02/17 1005  AST 25  ALT 19  ALKPHOS 75  BILITOT 1.0  PROT 6.9  ALBUMIN 3.7   No results for input(s): LIPASE, AMYLASE in the last 168 hours. No results for input(s): AMMONIA in the last 168 hours. Coagulation Profile: No results for input(s): INR, PROTIME in the last 168 hours. Cardiac Enzymes: No results for input(s): CKTOTAL, CKMB, CKMBINDEX, TROPONINI in the last 168 hours. BNP (last 3 results)  Recent Labs  02/12/17 1522  PROBNP 109   HbA1C: No results for input(s): HGBA1C in the last 72 hours. CBG:  Recent Labs Lab 10/06/17 1628 10/06/17 2042 10/07/17 0751 10/07/17 1210 10/07/17 1712  GLUCAP 202* 183* 156* 248* 236*   Lipid Profile: No results for input(s): CHOL, HDL, LDLCALC, TRIG, CHOLHDL, LDLDIRECT in the last 72 hours. Thyroid Function Tests: No results for input(s): TSH, T4TOTAL, FREET4, T3FREE,  THYROIDAB in the last 72 hours. Anemia Panel: No results for input(s): VITAMINB12, FOLATE, FERRITIN, TIBC, IRON, RETICCTPCT in the last 72 hours. Sepsis Labs:  Recent Labs Lab 10/02/17 1025 10/02/17 1254  LATICACIDVEN 1.10 0.81    Recent Results (from the past 240 hour(s))  Urine culture     Status: Abnormal   Collection Time: 10/02/17  9:59 AM  Result Value Ref Range Status   Specimen Description URINE, RANDOM  Final   Special Requests NONE  Final   Culture >=100,000 COLONIES/mL CITROBACTER FREUNDII (  A)  Final   Report Status 10/04/2017 FINAL  Final   Organism ID, Bacteria CITROBACTER FREUNDII (A)  Final      Susceptibility   Citrobacter freundii - MIC*    CEFAZOLIN >=64 RESISTANT Resistant     CEFTRIAXONE <=1 SENSITIVE Sensitive     CIPROFLOXACIN <=0.25 SENSITIVE Sensitive     GENTAMICIN <=1 SENSITIVE Sensitive     IMIPENEM <=0.25 SENSITIVE Sensitive     NITROFURANTOIN <=16 SENSITIVE Sensitive     TRIMETH/SULFA <=20 SENSITIVE Sensitive     PIP/TAZO <=4 SENSITIVE Sensitive     * >=100,000 COLONIES/mL CITROBACTER FREUNDII  Blood Culture (routine x 2)     Status: None   Collection Time: 10/02/17 10:05 AM  Result Value Ref Range Status   Specimen Description BLOOD RIGHT ANTECUBITAL  Final   Special Requests   Final    BOTTLES DRAWN AEROBIC AND ANAEROBIC Blood Culture adequate volume   Culture NO GROWTH 5 DAYS  Final   Report Status 10/07/2017 FINAL  Final  Blood Culture (routine x 2)     Status: None   Collection Time: 10/02/17 10:20 AM  Result Value Ref Range Status   Specimen Description BLOOD LEFT HAND  Final   Special Requests   Final    BOTTLES DRAWN AEROBIC AND ANAEROBIC Blood Culture adequate volume   Culture NO GROWTH 5 DAYS  Final   Report Status 10/07/2017 FINAL  Final      Radiology Studies: Dg Chest Port 1 View  Result Date: 10/06/2017 CLINICAL DATA:  Shortness of breath. EXAM: PORTABLE CHEST 1 VIEW COMPARISON:  Chest x-ray dated 10/02/2017 FINDINGS:  The heart size and pulmonary vascularity are normal. The lungs are clear except for minimal atelectasis at the lung bases, due to a shallow inspiration. Aortic atherosclerosis. No acute bone abnormality.  Previous thoracic fusion. IMPRESSION: Minimal bibasilar atelectasis, most likely secondary to a shallow inspiration. Aortic atherosclerosis. Electronically Signed   By: Lorriane Shire M.D.   On: 10/06/2017 11:30    Scheduled Meds: . aspirin EC  81 mg Oral Daily  . cefpodoxime  200 mg Oral Q12H  . docusate sodium  100 mg Oral BID  . escitalopram  10 mg Oral Daily  . fesoterodine  8 mg Oral Daily  . furosemide  20 mg Oral BID  . gabapentin  400 mg Oral TID  . heparin  5,000 Units Subcutaneous Q8H  . hydrALAZINE  25 mg Oral Q8H  . insulin aspart  0-9 Units Subcutaneous TID WC  . levothyroxine  175 mcg Oral QAC breakfast  . polyethylene glycol  17 g Oral Daily  . potassium chloride  10 mEq Oral BID  . pramipexole  0.5 mg Oral BID  . tamsulosin  0.4 mg Oral Daily  . traZODone  50 mg Oral QHS   Continuous Infusions:    LOS: 5 days    Time spent: Total of 15 minutes spent with pt, greater than 50% of which was spent in discussion of  treatment, counseling and coordination of care   Berle Mull, MD Pager: Text Page via [email or web address]   If 7PM-7AM, please contact night-coverage [email or web address] 10/07/2017, 6:15 PM

## 2017-10-08 ENCOUNTER — Encounter (HOSPITAL_COMMUNITY): Payer: Self-pay

## 2017-10-08 ENCOUNTER — Inpatient Hospital Stay (HOSPITAL_COMMUNITY)
Admission: AD | Admit: 2017-10-08 | Discharge: 2017-10-20 | DRG: 945 | Disposition: A | Discharge status: Home / Self Care | Payer: Medicare Other | Source: Intra-hospital | Attending: Physical Medicine & Rehabilitation | Admitting: Physical Medicine & Rehabilitation

## 2017-10-08 ENCOUNTER — Encounter (HOSPITAL_COMMUNITY): Payer: Self-pay | Admitting: *Deleted

## 2017-10-08 DIAGNOSIS — E1122 Type 2 diabetes mellitus with diabetic chronic kidney disease: Secondary | ICD-10-CM | POA: Diagnosis present

## 2017-10-08 DIAGNOSIS — G4733 Obstructive sleep apnea (adult) (pediatric): Secondary | ICD-10-CM | POA: Diagnosis present

## 2017-10-08 DIAGNOSIS — Z7982 Long term (current) use of aspirin: Secondary | ICD-10-CM

## 2017-10-08 DIAGNOSIS — E1169 Type 2 diabetes mellitus with other specified complication: Secondary | ICD-10-CM

## 2017-10-08 DIAGNOSIS — G63 Polyneuropathy in diseases classified elsewhere: Secondary | ICD-10-CM

## 2017-10-08 DIAGNOSIS — Z981 Arthrodesis status: Secondary | ICD-10-CM

## 2017-10-08 DIAGNOSIS — I5033 Acute on chronic diastolic (congestive) heart failure: Secondary | ICD-10-CM | POA: Diagnosis not present

## 2017-10-08 DIAGNOSIS — D62 Acute posthemorrhagic anemia: Secondary | ICD-10-CM | POA: Diagnosis present

## 2017-10-08 DIAGNOSIS — N189 Chronic kidney disease, unspecified: Secondary | ICD-10-CM | POA: Diagnosis present

## 2017-10-08 DIAGNOSIS — M4712 Other spondylosis with myelopathy, cervical region: Secondary | ICD-10-CM | POA: Diagnosis present

## 2017-10-08 DIAGNOSIS — G959 Disease of spinal cord, unspecified: Secondary | ICD-10-CM | POA: Diagnosis present

## 2017-10-08 DIAGNOSIS — I130 Hypertensive heart and chronic kidney disease with heart failure and stage 1 through stage 4 chronic kidney disease, or unspecified chronic kidney disease: Secondary | ICD-10-CM | POA: Diagnosis present

## 2017-10-08 DIAGNOSIS — M4807 Spinal stenosis, lumbosacral region: Secondary | ICD-10-CM | POA: Diagnosis not present

## 2017-10-08 DIAGNOSIS — E1165 Type 2 diabetes mellitus with hyperglycemia: Secondary | ICD-10-CM | POA: Diagnosis not present

## 2017-10-08 DIAGNOSIS — Z9049 Acquired absence of other specified parts of digestive tract: Secondary | ICD-10-CM

## 2017-10-08 DIAGNOSIS — Z87891 Personal history of nicotine dependence: Secondary | ICD-10-CM

## 2017-10-08 DIAGNOSIS — Z23 Encounter for immunization: Secondary | ICD-10-CM | POA: Diagnosis not present

## 2017-10-08 DIAGNOSIS — Z884 Allergy status to anesthetic agent status: Secondary | ICD-10-CM

## 2017-10-08 DIAGNOSIS — I5042 Chronic combined systolic (congestive) and diastolic (congestive) heart failure: Secondary | ICD-10-CM | POA: Diagnosis present

## 2017-10-08 DIAGNOSIS — R0902 Hypoxemia: Secondary | ICD-10-CM | POA: Diagnosis present

## 2017-10-08 DIAGNOSIS — K5901 Slow transit constipation: Secondary | ICD-10-CM | POA: Diagnosis present

## 2017-10-08 DIAGNOSIS — J449 Chronic obstructive pulmonary disease, unspecified: Secondary | ICD-10-CM | POA: Diagnosis present

## 2017-10-08 DIAGNOSIS — B9689 Other specified bacterial agents as the cause of diseases classified elsewhere: Secondary | ICD-10-CM | POA: Diagnosis present

## 2017-10-08 DIAGNOSIS — Z79890 Hormone replacement therapy (postmenopausal): Secondary | ICD-10-CM

## 2017-10-08 DIAGNOSIS — Z888 Allergy status to other drugs, medicaments and biological substances status: Secondary | ICD-10-CM

## 2017-10-08 DIAGNOSIS — Z833 Family history of diabetes mellitus: Secondary | ICD-10-CM

## 2017-10-08 DIAGNOSIS — M62838 Other muscle spasm: Secondary | ICD-10-CM

## 2017-10-08 DIAGNOSIS — E039 Hypothyroidism, unspecified: Secondary | ICD-10-CM | POA: Diagnosis present

## 2017-10-08 DIAGNOSIS — Z8249 Family history of ischemic heart disease and other diseases of the circulatory system: Secondary | ICD-10-CM

## 2017-10-08 DIAGNOSIS — R5381 Other malaise: Principal | ICD-10-CM

## 2017-10-08 DIAGNOSIS — Z713 Dietary counseling and surveillance: Secondary | ICD-10-CM

## 2017-10-08 DIAGNOSIS — Z881 Allergy status to other antibiotic agents status: Secondary | ICD-10-CM

## 2017-10-08 DIAGNOSIS — G2581 Restless legs syndrome: Secondary | ICD-10-CM | POA: Diagnosis present

## 2017-10-08 DIAGNOSIS — E669 Obesity, unspecified: Secondary | ICD-10-CM

## 2017-10-08 DIAGNOSIS — N390 Urinary tract infection, site not specified: Secondary | ICD-10-CM | POA: Diagnosis present

## 2017-10-08 DIAGNOSIS — G8250 Quadriplegia, unspecified: Secondary | ICD-10-CM

## 2017-10-08 DIAGNOSIS — Z6837 Body mass index (BMI) 37.0-37.9, adult: Secondary | ICD-10-CM

## 2017-10-08 DIAGNOSIS — Z8679 Personal history of other diseases of the circulatory system: Secondary | ICD-10-CM | POA: Diagnosis not present

## 2017-10-08 DIAGNOSIS — Z7984 Long term (current) use of oral hypoglycemic drugs: Secondary | ICD-10-CM

## 2017-10-08 DIAGNOSIS — Z79899 Other long term (current) drug therapy: Secondary | ICD-10-CM

## 2017-10-08 LAB — MAGNESIUM: MAGNESIUM: 2.4 mg/dL (ref 1.7–2.4)

## 2017-10-08 LAB — CBC WITH DIFFERENTIAL/PLATELET
Basophils Absolute: 0 10*3/uL (ref 0.0–0.1)
Basophils Relative: 0 %
EOS ABS: 0.2 10*3/uL (ref 0.0–0.7)
EOS PCT: 3 %
HCT: 34.9 % — ABNORMAL LOW (ref 39.0–52.0)
Hemoglobin: 11.2 g/dL — ABNORMAL LOW (ref 13.0–17.0)
LYMPHS ABS: 1.4 10*3/uL (ref 0.7–4.0)
Lymphocytes Relative: 27 %
MCH: 29.9 pg (ref 26.0–34.0)
MCHC: 32.1 g/dL (ref 30.0–36.0)
MCV: 93.1 fL (ref 78.0–100.0)
MONO ABS: 0.5 10*3/uL (ref 0.1–1.0)
MONOS PCT: 8 %
Neutro Abs: 3.3 10*3/uL (ref 1.7–7.7)
Neutrophils Relative %: 62 %
PLATELETS: 157 10*3/uL (ref 150–400)
RBC: 3.75 MIL/uL — AB (ref 4.22–5.81)
RDW: 15.4 % (ref 11.5–15.5)
WBC: 5.4 10*3/uL (ref 4.0–10.5)

## 2017-10-08 LAB — COMPREHENSIVE METABOLIC PANEL
ALT: 58 U/L (ref 17–63)
AST: 44 U/L — AB (ref 15–41)
Albumin: 3.1 g/dL — ABNORMAL LOW (ref 3.5–5.0)
Alkaline Phosphatase: 62 U/L (ref 38–126)
Anion gap: 8 (ref 5–15)
BUN: 39 mg/dL — AB (ref 6–20)
CHLORIDE: 102 mmol/L (ref 101–111)
CO2: 28 mmol/L (ref 22–32)
CREATININE: 1.34 mg/dL — AB (ref 0.61–1.24)
Calcium: 8.5 mg/dL — ABNORMAL LOW (ref 8.9–10.3)
GFR calc Af Amer: 59 mL/min — ABNORMAL LOW (ref >60)
GFR, EST NON AFRICAN AMERICAN: 51 mL/min — AB (ref >60)
Glucose, Bld: 167 mg/dL — ABNORMAL HIGH (ref 65–99)
Potassium: 3.9 mmol/L (ref 3.5–5.1)
Sodium: 138 mmol/L (ref 135–145)
Total Bilirubin: 1 mg/dL (ref 0.3–1.2)
Total Protein: 6.2 g/dL — ABNORMAL LOW (ref 6.5–8.1)

## 2017-10-08 LAB — GLUCOSE, CAPILLARY
GLUCOSE-CAPILLARY: 179 mg/dL — AB (ref 65–99)
GLUCOSE-CAPILLARY: 152 mg/dL — AB (ref 65–99)
Glucose-Capillary: 157 mg/dL — ABNORMAL HIGH (ref 65–99)
Glucose-Capillary: 178 mg/dL — ABNORMAL HIGH (ref 65–99)
Glucose-Capillary: 235 mg/dL — ABNORMAL HIGH (ref 65–99)

## 2017-10-08 MED ORDER — BACLOFEN 10 MG PO TABS
10.0000 mg | ORAL_TABLET | Freq: Three times a day (TID) | ORAL | Status: DC | PRN
  Administered 2017-10-08: 10 mg via ORAL
  Filled 2017-10-08: qty 1

## 2017-10-08 MED ORDER — CEFPODOXIME PROXETIL 200 MG PO TABS: 200.0000 mg | ORAL_TABLET | Freq: Two times a day (BID) | ORAL | 0 refills | Status: DC

## 2017-10-08 MED ORDER — TIOTROPIUM BROMIDE MONOHYDRATE 18 MCG IN CAPS: 18.0000 ug | ORAL_CAPSULE | Freq: Every day | RESPIRATORY_TRACT | 0 refills | Status: DC

## 2017-10-08 MED ORDER — FUROSEMIDE 20 MG PO TABS
20.0000 mg | ORAL_TABLET | Freq: Two times a day (BID) | ORAL | 0 refills | Status: DC
Start: 2017-10-08 — End: 2017-10-20

## 2017-10-08 MED ORDER — TRAZODONE HCL 50 MG PO TABS: 50.0000 mg | ORAL_TABLET | Freq: Every day | ORAL | 0 refills | Status: DC

## 2017-10-08 MED ORDER — ESCITALOPRAM OXALATE 10 MG PO TABS
10.0000 mg | ORAL_TABLET | Freq: Every day | ORAL | Status: DC
  Administered 2017-10-09 – 2017-10-20 (×12): 10 mg via ORAL
  Filled 2017-10-08 (×12): qty 1

## 2017-10-08 MED ORDER — FUROSEMIDE 20 MG PO TABS
20.0000 mg | ORAL_TABLET | Freq: Two times a day (BID) | ORAL | Status: DC
  Administered 2017-10-08 – 2017-10-20 (×23): 20 mg via ORAL
  Filled 2017-10-08 (×25): qty 1

## 2017-10-08 MED ORDER — BACLOFEN 10 MG PO TABS
10.0000 mg | ORAL_TABLET | Freq: Three times a day (TID) | ORAL | Status: DC
  Administered 2017-10-08 – 2017-10-20 (×35): 10 mg via ORAL
  Filled 2017-10-08 (×35): qty 1

## 2017-10-08 MED ORDER — ACETAMINOPHEN 325 MG PO TABS: 650.0000 mg | ORAL_TABLET | Freq: Four times a day (QID) | ORAL | Status: DC | PRN

## 2017-10-08 MED ORDER — TRAZODONE HCL 50 MG PO TABS
50.0000 mg | ORAL_TABLET | Freq: Every day | ORAL | Status: DC
  Administered 2017-10-09 – 2017-10-19 (×10): 50 mg via ORAL
  Filled 2017-10-08 (×12): qty 1

## 2017-10-08 MED ORDER — ALBUTEROL SULFATE HFA 108 (90 BASE) MCG/ACT IN AERS: 2.0000 | INHALATION_SPRAY | Freq: Four times a day (QID) | RESPIRATORY_TRACT | 2 refills | Status: DC | PRN

## 2017-10-08 MED ORDER — POLYETHYLENE GLYCOL 3350 17 G PO PACK
17.0000 g | PACK | Freq: Every day | ORAL | Status: DC
  Administered 2017-10-09 – 2017-10-20 (×10): 17 g via ORAL
  Filled 2017-10-08 (×12): qty 1

## 2017-10-08 MED ORDER — LEVOTHYROXINE SODIUM 75 MCG PO TABS
175.0000 ug | ORAL_TABLET | Freq: Every day | ORAL | Status: DC
  Administered 2017-10-09 – 2017-10-20 (×12): 175 ug via ORAL
  Filled 2017-10-08 (×12): qty 1

## 2017-10-08 MED ORDER — MENTHOL 3 MG MT LOZG
1.0000 | LOZENGE | OROMUCOSAL | Status: DC | PRN
  Filled 2017-10-08: qty 9

## 2017-10-08 MED ORDER — ASPIRIN EC 81 MG PO TBEC
81.0000 mg | DELAYED_RELEASE_TABLET | Freq: Every day | ORAL | Status: DC
  Administered 2017-10-09 – 2017-10-20 (×12): 81 mg via ORAL
  Filled 2017-10-08 (×12): qty 1

## 2017-10-08 MED ORDER — CEFPODOXIME PROXETIL 200 MG PO TABS
200.0000 mg | ORAL_TABLET | Freq: Two times a day (BID) | ORAL | Status: AC
  Administered 2017-10-08 – 2017-10-11 (×6): 200 mg via ORAL
  Filled 2017-10-08 (×6): qty 1

## 2017-10-08 MED ORDER — DOCUSATE SODIUM 100 MG PO CAPS
100.0000 mg | ORAL_CAPSULE | Freq: Two times a day (BID) | ORAL | Status: DC
  Administered 2017-10-08 – 2017-10-18 (×20): 100 mg via ORAL
  Filled 2017-10-08 (×20): qty 1

## 2017-10-08 MED ORDER — FESOTERODINE FUMARATE ER 8 MG PO TB24
8.0000 mg | ORAL_TABLET | Freq: Every day | ORAL | Status: DC
  Administered 2017-10-09 – 2017-10-20 (×12): 8 mg via ORAL
  Filled 2017-10-08 (×12): qty 1

## 2017-10-08 MED ORDER — PRAMIPEXOLE DIHYDROCHLORIDE 0.25 MG PO TABS
0.5000 mg | ORAL_TABLET | Freq: Two times a day (BID) | ORAL | Status: DC
  Administered 2017-10-08 – 2017-10-20 (×24): 0.5 mg via ORAL
  Filled 2017-10-08 (×24): qty 2

## 2017-10-08 MED ORDER — INFLUENZA VAC SPLIT HIGH-DOSE 0.5 ML IM SUSY
0.5000 mL | PREFILLED_SYRINGE | INTRAMUSCULAR | Status: AC | PRN
  Administered 2017-10-13: 0.5 mL via INTRAMUSCULAR
  Filled 2017-10-08: qty 0.5

## 2017-10-08 MED ORDER — TAMSULOSIN HCL 0.4 MG PO CAPS
0.4000 mg | ORAL_CAPSULE | Freq: Every day | ORAL | Status: DC
  Administered 2017-10-09 – 2017-10-20 (×12): 0.4 mg via ORAL
  Filled 2017-10-08 (×12): qty 1

## 2017-10-08 MED ORDER — DOCUSATE SODIUM 100 MG PO CAPS: 100.0000 mg | ORAL_CAPSULE | Freq: Two times a day (BID) | ORAL | 0 refills | Status: DC

## 2017-10-08 MED ORDER — POTASSIUM CHLORIDE CRYS ER 10 MEQ PO TBCR
10.0000 meq | EXTENDED_RELEASE_TABLET | Freq: Two times a day (BID) | ORAL | Status: DC
  Administered 2017-10-08 – 2017-10-20 (×24): 10 meq via ORAL
  Filled 2017-10-08 (×24): qty 1

## 2017-10-08 MED ORDER — HYDRALAZINE HCL 25 MG PO TABS
25.0000 mg | ORAL_TABLET | Freq: Three times a day (TID) | ORAL | Status: DC
  Administered 2017-10-08 – 2017-10-17 (×28): 25 mg via ORAL
  Filled 2017-10-08 (×28): qty 1

## 2017-10-08 MED ORDER — MOMETASONE FURO-FORMOTEROL FUM 200-5 MCG/ACT IN AERO: 2.0000 | INHALATION_SPRAY | Freq: Two times a day (BID) | RESPIRATORY_TRACT | 0 refills | Status: DC

## 2017-10-08 MED ORDER — INSULIN ASPART 100 UNIT/ML ~~LOC~~ SOLN
0.0000 [IU] | Freq: Three times a day (TID) | SUBCUTANEOUS | Status: DC
  Administered 2017-10-08: 2 [IU] via SUBCUTANEOUS
  Administered 2017-10-09: 1 [IU] via SUBCUTANEOUS
  Administered 2017-10-09 (×2): 2 [IU] via SUBCUTANEOUS
  Administered 2017-10-10: 1 [IU] via SUBCUTANEOUS
  Administered 2017-10-10 – 2017-10-12 (×6): 2 [IU] via SUBCUTANEOUS
  Administered 2017-10-12: 1 [IU] via SUBCUTANEOUS
  Administered 2017-10-12: 3 [IU] via SUBCUTANEOUS
  Administered 2017-10-13 – 2017-10-15 (×8): 2 [IU] via SUBCUTANEOUS
  Administered 2017-10-15: 3 [IU] via SUBCUTANEOUS
  Administered 2017-10-16: 2 [IU] via SUBCUTANEOUS
  Administered 2017-10-16: 3 [IU] via SUBCUTANEOUS
  Administered 2017-10-16 – 2017-10-17 (×2): 1 [IU] via SUBCUTANEOUS
  Administered 2017-10-17 (×2): 2 [IU] via SUBCUTANEOUS
  Administered 2017-10-18: 1 [IU] via SUBCUTANEOUS
  Administered 2017-10-18: 2 [IU] via SUBCUTANEOUS
  Administered 2017-10-18: 1 [IU] via SUBCUTANEOUS
  Administered 2017-10-19 (×2): 2 [IU] via SUBCUTANEOUS
  Administered 2017-10-19: 1 [IU] via SUBCUTANEOUS
  Administered 2017-10-20: 2 [IU] via SUBCUTANEOUS

## 2017-10-08 MED ORDER — GABAPENTIN 400 MG PO CAPS
400.0000 mg | ORAL_CAPSULE | Freq: Three times a day (TID) | ORAL | Status: DC
  Administered 2017-10-08 – 2017-10-20 (×36): 400 mg via ORAL
  Filled 2017-10-08 (×36): qty 1

## 2017-10-08 NOTE — Progress Notes (Signed)
RT to bedside to place CPAP on and complete NIF. RT found pt with home CPAP already on and tape on pts mouth. When asked pt if he had placed tape on his mouth pt nodded and and shook  His head no when asked to remove to complete NIF. When RT asked RN about the tape RN stated that the pt had asked for his tape from home shortly before RT arrived but had no idea that pt had placed it across his mouth. RN aware of situation and to stress the facts of aspiration when mouth is covered. RT educated pt and RN both. RT to cont to monitor.

## 2017-10-08 NOTE — H&P (Signed)
Physical Medicine and Rehabilitation Admission H&P        Chief Complaint  Patient presents with  . Debility       HPI:  Travis [name] is a 71 y.o. 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 He was treated with IV diuresis for acute on chronic diastolic CHF with improvement but continues to have hypoxia with activity.  Cardiology question pulmonary component and PFTs revealed severe obstructive lung disease. He has diuresed 6 L and renal status has been stable.  Wife admitted yesterday for GB attack requiring surgery.  He has had recent workup revealing severe L4-5 spinal stenosis and was being referred to Dr. Ronnald Ramp for surgical consideration.    He has had decline in mobility as well as decline in ability to carry out ADLs CIR recommended for follow up therapy.     Patient states he was still working about 6 hours per day and driving, he had a decline in function, starting about 1 month prior to admission     Review of Systems  Constitutional: Negative for chills and fever.  HENT: Negative for hearing loss and tinnitus.   Eyes: Negative for blurred vision and double vision.  Respiratory: Negative for cough, shortness of breath and wheezing.   Cardiovascular: Positive for leg swelling. Negative for chest pain and palpitations.  Gastrointestinal: Negative for constipation, heartburn and nausea.  Genitourinary: Negative for dysuria and urgency.  Musculoskeletal: Positive for myalgias. Negative for back pain.  Skin: Negative for itching and rash.  Neurological: Positive for sensory change, focal weakness and weakness. Negative for dizziness and headaches.  Psychiatric/Behavioral: The patient does not have insomnia.             Past Medical History:  Diagnosis  Date  . Cervical myelopathy (HCC)      with myelomalacia at C5-6 and C3-4  . CHF (congestive heart failure) ([place])    . Constipation - functional      controlled with miralax  . DJD (degenerative joint disease)      WHOLE SPINE  . Dyspnea    . Gait disorder    . Hypertension    . Hyperthyroidism      Had Iodine to resolve issue DX 10 YR AGO  . Hypothyroidism      Low d/t treatment of hyperthyroidism  . 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 Peripheral neuropathy    . Pneumonia    . PONV (postoperative nausea and vomiting)    . RBBB    . Renal disorder      acute kidney failure  . Restless legs syndrome 03/14/2015  . RLS (restless legs syndrome)    . Sleep apnea      DX  2009/2010 study done at [facility]  . Thoracic myelopathy      T1-2 and T4-5  . Type II diabetes mellitus ([place])      diet controlled           Past Surgical History:  Procedure Laterality Date  . BACK SURGERY        6 surgeries  . CARDIAC CATHETERIZATION [name] 09/07/2015    Procedure: Right/Left Heart Cath and Coronary Angiography;  Surgeon: Peter M Martinique, MD;  Location: [facility];  Service: Cardiovascular;  Laterality: [name];  . CHOLECYSTECTOMY      .  LUMBAR LAMINECTOMY/DECOMPRESSION MICRODISCECTOMY   07/14/2012    Procedure: LUMBAR LAMINECTOMY/DECOMPRESSION MICRODISCECTOMY 2 LEVELS;  Surgeon: Eustace Moore, MD;  Location: [facility];  Service: Neurosurgery;  Laterality: Left;  Lumbar two three laminectomy, left thoracic four five transpedicular diskectomy           Family History  Problem Relation Age of Onset  . Polycythemia Mother    . Diabetes Father    . Hypertension Father    . Heart disease Father    . CAD Unknown          1 of his 4 brothers and father has CAD      Social History:    Married. Has has decline in mobility for past 6 months and required assistance with LB ADLs. He  was independent with walker. Per reports that he quit smoking about 32 years ago. His smoking use included  Cigarettes. He has a 40.00 pack-year smoking history. He quit smokeless tobacco use about 32 years ago. He reports that he does not drink alcohol or use drugs.           Allergies  Allergen Reactions  . Other Other (See Comments)      Anesthesia makes sick, must have antinausea medication in advance.  . Levofloxacin In D5w Itching      Possible infusion reaction 03/29/17. Tolerating oral Levaquin 03/30/17          Medications Prior to Admission  Medication Sig Dispense Refill  . aspirin EC 81 MG EC tablet Take 1 tablet (81 mg total) by mouth daily. 30 tablet 0  . baclofen (LIORESAL) 10 MG tablet Take 1 tablet (10 mg total) by mouth 3 (three) times daily. For muscle spasms 90 each 1  . escitalopram (LEXAPRO) 10 MG tablet Take 10 mg by mouth daily.   2  . gabapentin (NEURONTIN) 400 MG capsule TAKE 1 CAPSULE (400 MG TOTAL) BY MOUTH EVERY 8 (EIGHT) HOURS. 90 capsule 0  . glimepiride (AMARYL) 2 MG tablet Take 2 mg by mouth daily with breakfast.      . hydrALAZINE (APRESOLINE) 25 MG tablet Take 1 tablet (25 mg total) by mouth every 8 (eight) hours. 90 tablet 0  . levothyroxine (SYNTHROID, LEVOTHROID) 175 MCG tablet Take 175 mcg by mouth daily.   2  . losartan (COZAAR) 50 MG tablet TAKE 1 TABLET BY MOUTH ONCE DAILY. 90 tablet 3  . polyethylene glycol (MIRALAX / GLYCOLAX) packet Take 17 g by mouth daily as needed. For constipation (Patient taking differently: Take 17 g by mouth daily as needed for moderate constipation. ) 14 each 0  . potassium chloride (KLOR-CON 10) 10 MEQ tablet Take 1 tablet (10 mEq total) by mouth 2 (two) times daily. 180 tablet 3  . pramipexole (MIRAPEX) 0.5 MG tablet Take 0.5 mg by mouth 2 (two) times daily.       . tamsulosin (FLOMAX) 0.4 MG CAPS capsule Take 0.4 mg by mouth daily.       [name] torsemide (DEMADEX) 20 MG tablet Take 1 tablet (20 mg total) by mouth 2 (two) times daily. Take additional 10 mg as needed for weight gain or shortness of breath. 60 tablet 11  . TOVIAZ 8 MG  TB24 tablet Take 8 mg by mouth daily.   11      Drug Regimen Review  Drug regimen was reviewed and remains appropriate with no significant issues identified   Home: Home Living Family/patient expects to be discharged to:: Private residence Living Arrangements:  Spouse/significant other Available Help at Discharge: Family, Available 24 hours/day Type of Home: House Home Access: Ramped entrance Home Layout: One level Bathroom Shower/Tub: Multimedia programmer: Handicapped height Bathroom Accessibility: Yes Home Equipment: Environmental consultant - 2 wheels, Environmental consultant - 4 wheels, [employer] - single point, Bedside commode, Wheelchair - manual, Shower seat - built in, [employer] - tub/shower Additional Comments: 2 mod assist to stand, wife with pt alone many times  Lives With: Spouse   Functional History: Prior Function Level of Independence: Needs assistance Gait / Transfers Assistance Needed: Amb with RW. Requires assist from wife to stand from chair.  ADL's / Homemaking Assistance Needed: wife has to assist with donning/doffing socks and shoes  Comments: uses WC and lift chair at home   Functional Status:  Mobility: Bed Mobility General bed mobility comments: Pt up in recliner Transfers Overall transfer level: Needs assistance Equipment used: Rolling walker (2 wheeled) Transfers: Sit to/from Stand Sit to Stand: +2 physical assistance, Mod assist, From elevated surface General transfer comment: Assist to bring hips and trunk up. Pt's recliner built up with pillows and blankets for a higher surface but pt still requires assist and incr time and effort to come to stand Ambulation/Gait Ambulation/Gait assistance: Min assist, +2 safety/equipment, Mod assist Ambulation Distance (Feet): 120 Feet Assistive device: Rolling walker (2 wheeled) Gait Pattern/deviations: Decreased step length - left, Trunk flexed, Shuffle, Decreased dorsiflexion - right, Step-through pattern, Decreased step length -  right General Gait Details: Pt amb on 2L of O2 with SpO2 >90%. Pt with rt leg with decr ground clearance and scuffing across floor. Verbal cues to stand more upright. Incr fatigue as distance incr with pt requiring incr assist. Gait velocity: reduced Gait velocity interpretation: Below normal speed for age/gender   ADL: ADL Overall ADL's : Needs assistance/impaired Eating/Feeding: Set up, Sitting Grooming: Wash/dry hands, Wash/dry face, Oral care, Sitting Upper Body Bathing: Minimal assistance, Sitting Lower Body Bathing: Maximal assistance, Sit to/from stand Upper Body Dressing : Minimal assistance, Sitting Lower Body Dressing: Maximal assistance, Sit to/from stand Toilet Transfer: +2 for physical assistance, Maximal assistance, BSC, RW (to stand from [facility] over toilet) Toilet Transfer Details (indicate cue type and reason): Max assist +2 for sit to stand min assist for functional mobility Toileting- Clothing Manipulation and Hygiene: Total assistance, +2 for physical assistance, Sit to/from stand Functional mobility during ADLs: Minimal assistance, Rolling walker, +2 for safety/equipment General ADL Comments: Pt is agreeable to going to CIR for further rehab.   Cognition: Cognition Overall Cognitive Status: Within Functional Limits for tasks assessed Cognition Arousal/Alertness: Awake/alert Behavior During Therapy: WFL for tasks assessed/performed Overall Cognitive Status: Within Functional Limits for tasks assessed General Comments: educated pt on benefits of more intensive therapy prior to going home as he requires extensive assist for standing     Blood pressure (!) 165/75, pulse 77, temperature 98 F (36.7 C), temperature source Oral, resp. rate 18, height 6' 1"  (1.854 m), weight 128.1 kg (282 lb 4.8 oz), SpO2 91 %. Physical Exam  Nursing note and vitals reviewed. Constitutional: He is oriented to person, place, and time. He appears well-developed and well-nourished. No  distress.  Obese male. Up in chair. NAD  HENT:  Head: Normocephalic and atraumatic.  Mouth/Throat: Oropharynx is clear and moist.  Eyes: Pupils are equal, round, and reactive to light. Conjunctivae and EOM are normal.  Neck: Normal range of motion. Neck supple.  Cardiovascular: Normal rate and regular rhythm.   Respiratory: Effort normal and breath sounds normal. No  stridor. He exhibits no tenderness.  GI: Soft. Bowel sounds are normal. He exhibits no distension. There is no tenderness.  Musculoskeletal: He exhibits edema (1-2+ edema BLE).  Stasis changes BLE  Neurological: He is alert and oriented to person, place, and time.  Skin: Skin is warm and dry. He is not diaphoretic.  Psychiatric: He has a normal mood and affect. His behavior is normal. Judgment and thought content normal.   Motor 4/5 in Bilateral deltoid, biceps, triceps, grip 2 minus bilaterally flexor, knee extensor and ankle dorsiflexor. Trace toe flexors and toe extensors Sensation reduced proprioception bilateral great toes, intact at the ankle. Intact proprioception in the upper limbs, normal light touch in the upper and lower limbs   Lab Results Last 48 Hours        Results for orders placed or performed during the hospital encounter of 10/02/17 (from the past 48 hour(s))  Glucose, capillary     Status: Abnormal    Collection Time: 10/06/17  4:28 PM  Result Value Ref Range    Glucose-Capillary 202 (H) 65 - 99 mg/dL  Glucose, capillary     Status: Abnormal    Collection Time: 10/06/17  8:42 PM  Result Value Ref Range    Glucose-Capillary 183 (H) 65 - 99 mg/dL  Basic metabolic panel     Status: Abnormal    Collection Time: 10/07/17  3:37 AM  Result Value Ref Range    Sodium 140 135 - 145 mmol/L    Potassium 3.7 3.5 - 5.1 mmol/L    Chloride 100 (L) 101 - 111 mmol/L    CO2 29 22 - 32 mmol/L    Glucose, Bld 142 (H) 65 - 99 mg/dL    BUN 43 (H) 6 - 20 mg/dL    Creatinine, Ser 1.45 (H) 0.61 - 1.24 mg/dL    Calcium  8.8 (L) 8.9 - 10.3 mg/dL    GFR calc non Af Amer 47 (L) >60 mL/min    GFR calc Af Amer 54 (L) >60 mL/min      Comment: (NOTE) The eGFR has been calculated using the CKD EPI equation. This calculation has not been validated in all clinical situations. eGFR's persistently <60 mL/min signify possible Chronic Kidney Disease.      Anion gap 11 5 - 15  Glucose, capillary     Status: Abnormal    Collection Time: 10/07/17  7:51 AM  Result Value Ref Range    Glucose-Capillary 156 (H) 65 - 99 mg/dL    Comment 1 Document in Chart    Blood gas, arterial     Status: Abnormal    Collection Time: 10/07/17 10:29 AM  Result Value Ref Range    FIO2 21.00      Delivery systems ROOM AIR      pH, Arterial 7.421 7.350 - 7.450    pCO2 arterial 45.1 32.0 - 48.0 mmHg    pO2, Arterial 55.9 (L) 83.0 - 108.0 mmHg    Bicarbonate 28.8 (H) 20.0 - 28.0 mmol/L    Acid-Base Excess 4.5 (H) 0.0 - 2.0 mmol/L    O2 Saturation 88.7 %    Patient temperature 98.6      Collection site RIGHT RADIAL      Drawn by [phone]      Sample type ARTERIAL DRAW      Allens test (pass/fail) PASS PASS  Glucose, capillary     Status: Abnormal    Collection Time: 10/07/17 12:10 PM  Result Value Ref Range  Glucose-Capillary 248 (H) 65 - 99 mg/dL    Comment 1 Document in Chart    Glucose, capillary     Status: Abnormal    Collection Time: 10/07/17  5:12 PM  Result Value Ref Range    Glucose-Capillary 236 (H) 65 - 99 mg/dL  Glucose, capillary     Status: Abnormal    Collection Time: 10/08/17  1:52 AM  Result Value Ref Range    Glucose-Capillary 157 (H) 65 - 99 mg/dL  Comprehensive metabolic panel     Status: Abnormal    Collection Time: 10/08/17  4:34 AM  Result Value Ref Range    Sodium 138 135 - 145 mmol/L    Potassium 3.9 3.5 - 5.1 mmol/L    Chloride 102 101 - 111 mmol/L    CO2 28 22 - 32 mmol/L    Glucose, Bld 167 (H) 65 - 99 mg/dL    BUN 39 (H) 6 - 20 mg/dL    Creatinine, Ser 1.34 (H) 0.61 - 1.24 mg/dL    Calcium 8.5  (L) 8.9 - 10.3 mg/dL    Total Protein 6.2 (L) 6.5 - 8.1 g/dL    Albumin 3.1 (L) 3.5 - 5.0 g/dL    AST 44 (H) 15 - 41 U/L    ALT 58 17 - 63 U/L    Alkaline Phosphatase 62 38 - 126 U/L    Total Bilirubin 1.0 0.3 - 1.2 mg/dL    GFR calc non Af Amer 51 (L) >60 mL/min    GFR calc Af Amer 59 (L) >60 mL/min      Comment: (NOTE) The eGFR has been calculated using the CKD EPI equation. This calculation has not been validated in all clinical situations. eGFR's persistently <60 mL/min signify possible Chronic Kidney Disease.      Anion gap 8 5 - 15  CBC with Differential/Platelet     Status: Abnormal    Collection Time: 10/08/17  4:34 AM  Result Value Ref Range    WBC 5.4 4.0 - 10.5 K/uL    RBC 3.75 (L) 4.22 - 5.81 MIL/uL    Hemoglobin 11.2 (L) 13.0 - 17.0 g/dL    HCT 34.9 (L) 39.0 - 52.0 %    MCV 93.1 78.0 - 100.0 fL    MCH 29.9 26.0 - 34.0 pg    MCHC 32.1 30.0 - 36.0 g/dL    RDW 15.4 11.5 - 15.5 %    Platelets 157 150 - 400 K/uL    Neutrophils Relative % 62 %    Neutro Abs 3.3 1.7 - 7.7 K/uL    Lymphocytes Relative 27 %    Lymphs Abs 1.4 0.7 - 4.0 K/uL    Monocytes Relative 8 %    Monocytes Absolute 0.5 0.1 - 1.0 K/uL    Eosinophils Relative 3 %    Eosinophils Absolute 0.2 0.0 - 0.7 K/uL    Basophils Relative 0 %    Basophils Absolute 0.0 0.0 - 0.1 K/uL  Magnesium     Status: None    Collection Time: 10/08/17  4:34 AM  Result Value Ref Range    Magnesium 2.4 1.7 - 2.4 mg/dL  Glucose, capillary     Status: Abnormal    Collection Time: 10/08/17  7:36 AM  Result Value Ref Range    Glucose-Capillary 178 (H) 65 - 99 mg/dL  Glucose, capillary     Status: Abnormal    Collection Time: 10/08/17 11:27 AM  Result Value Ref Range    Glucose-Capillary  235 (H) 65 - 99 mg/dL    Comment 1 Notify RN        Imaging Results (Last 48 hours)  No results found.           Medical Problem List and Plan: 1.  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  DVT Prophylaxis/Anticoagulation: Pharmaceutical: Lovenox 3. Pain Management: [name] 4. Mood: LCSW to follow for evaluation and support.  5. Neuropsych: This patient is capable of making decisions on his own behalf. 6. Skin/Wound Care: Routine pressure relief measures. Maintain adequate nutritional and hydration status.  7. Fluids/Electrolytes/Nutrition: Strict I/O. Low salt diet. Monitor renal status serially. 8. Acute on chronic diastolic CHF: Strict I/O with daily weights to monitor for overload. ON hydralazine, losartan and lasix.  9. Chronic hypoxia with new diagnosis of severe obstructive airway disease: Off oxygen now.  10. HTN: monitor BP bid.  11. OSA: CPAP daily --will order hospital machine as his machine has issues. 12.. Acute on chronic CKD:  Will need follow up MRI on outpatient basis. Monitor SCr.  13. Cervical/thoracic myelopathy/Severe lumbar stenosis with neuropathy:  Continue gabapentin tid.  14. Citrobacter UTI:  Antibiotic narrowed to Vantin--antibiotic  D# 7        Post Admission Physician Evaluation: 1. Functional deficits secondary  to debility superimposed on tetraparesis. 2. Patient is admitted to receive collaborative, interdisciplinary care between the physiatrist, rehab nursing staff, and therapy team. 3. Patient's level of medical complexity and substantial therapy needs in context of that medical necessity cannot be provided at a lesser intensity of care such as a SNF. 4. Patient has experienced substantial functional loss from his/her baseline which was documented above under the "Functional History" and "Functional Status" headings.  Judging by the patient's diagnosis, physical exam, and functional history, the patient has potential for functional progress which will result in measurable gains while on inpatient rehab.  These gains will be of substantial and practical use upon discharge  in facilitating mobility and self-care at the  household level. 5. Physiatrist will provide 24 hour management of medical needs as well as oversight of the therapy plan/treatment and provide guidance as appropriate regarding the interaction of the two. 6. The Preadmission Screening has been reviewed and patient status is unchanged unless otherwise stated above. 7. 24 hour rehab nursing will assist with bladder management, bowel management, safety, skin/wound care, disease management, medication administration, pain management and patient education  and help integrate therapy concepts, techniques,education, etc. 8. PT will assess and treat for/with: pre gait, gait training, endurance , safety, equipment, neuromuscular re education.   Goals are: Min/Sup. 9. OT will assess and treat for/with: ADLs, Cognitive perceptual skills, Neuromuscular re education, safety, endurance, equipment.   Goals are: Min/Sup. Therapy may proceed with showering this patient. 10. SLP will assess and treat for/with: NA.  Goals are: NA. 11. Case Management and Social Worker will assess and treat for psychological issues and discharge planning. 12. Team conference will be held weekly to assess progress toward goals and to determine barriers to discharge. 13. Patient will receive at least 3 hours of therapy per day at least 5 days per week. 14. ELOS: 12-16d       15. Prognosis:  good         Charlett Blake M.D. [facility] FAAPM&R (Sports Med, Neuromuscular Med) Diplomate Am Board of Electrodiagnostic Med  Flora Lipps 10/08/2017

## 2017-10-08 NOTE — Progress Notes (Signed)
Pt needs to use a hospital CPAP machine HS per preference while in hospital. Home CPAP machine not working correctly. RN to notify respiratory therapy with need.

## 2017-10-08 NOTE — Progress Notes (Signed)
Progress Note  Patient Name: Travis [name] Date of Encounter: 10/08/2017  Primary Cardiologist: Nahser  Subjective   71 year old gentleman with a history of chronic diastolic congestive heart failure, hypertension, chronic kidney disease admitted with respiratory failure.  He has diuresed nicely during this hospitalization.  I/O are -6.1 liters so far this admission.   Pulmonary function tests yesterday revealed severe obstructive lung disease.  Inpatient Medications    Scheduled Meds: . aspirin EC  81 mg Oral Daily  . cefpodoxime  200 mg Oral Q12H  . docusate sodium  100 mg Oral BID  . escitalopram  10 mg Oral Daily  . fesoterodine  8 mg Oral Daily  . furosemide  20 mg Oral BID  . gabapentin  400 mg Oral TID  . heparin  5,000 Units Subcutaneous Q8H  . hydrALAZINE  25 mg Oral Q8H  . insulin aspart  0-9 Units Subcutaneous TID WC  . levothyroxine  175 mcg Oral QAC breakfast  . polyethylene glycol  17 g Oral Daily  . potassium chloride  10 mEq Oral BID  . pramipexole  0.5 mg Oral BID  . tamsulosin  0.4 mg Oral Daily  . traZODone  50 mg Oral QHS   Continuous Infusions:  PRN Meds: acetaminophen, baclofen, menthol-cetylpyridinium   Vital Signs    Vitals:   10/07/17 2332 10/08/17 0500 10/08/17 0611 10/08/17 0757  BP: 121/63  (!) 144/60 (!) 145/70  Pulse: (!) 59   (!) 54  Resp: 16  18 18   Temp: 97.9 F (36.6 C)  98 F (36.7 C) 98 F (36.7 C)  TempSrc: Axillary   Oral  SpO2: 91%  91% 90%  Weight:  282 lb 4.8 oz (128.1 kg)    Height:        Intake/Output Summary (Last 24 hours) at 10/08/17 0932 Last data filed at 10/08/17 0757  Gross per 24 hour  Intake              580 ml  Output             1400 ml  Net             -820 ml   Filed Weights   10/06/17 0354 10/07/17 0534 10/08/17 0500  Weight: 276 lb 7.3 oz (125.4 kg) 280 lb (127 kg) 282 lb 4.8 oz (128.1 kg)    Telemetry    NSR  - Personally Reviewed  ECG     NSR  - Personally Reviewed  Physical  Exam   GEN: middle age male,   Generally weak, pleasant    Neck: No JVD Cardiac: RRR, no murmurs, rubs, or gallops.  Respiratory: Clear to auscultation bilaterally. GI: Soft, nontender, non-distended  MS: No edema; No deformity. Neuro:  Nonfocal  Psych: Normal affect   Labs    Chemistry Recent Labs Lab 10/02/17 1005  10/06/17 1226 10/07/17 0337 10/08/17 0434  NA 139  < > 139 140 138  K 4.1  < > 3.9 3.7 3.9  CL 104  < > 100* 100* 102  CO2 29  < > 28 29 28   GLUCOSE 200*  < > 180* 142* 167*  BUN 34*  < > 37* 43* 39*  CREATININE 1.89*  < > 1.26* 1.45* 1.34*  CALCIUM 8.8*  < > 9.2 8.8* 8.5*  PROT 6.9  --   --   --  6.2*  ALBUMIN 3.7  --   --   --  3.1*  AST 25  --   --   --  44*  ALT 19  --   --   --  58  ALKPHOS 75  --   --   --  62  BILITOT 1.0  --   --   --  1.0  GFRNONAA 34*  < > 55* 47* 51*  GFRAA 39*  < > >60 54* 59*  ANIONGAP 6  < > 11 11 8   < > = values in this interval not displayed.   Hematology Recent Labs Lab 10/03/17 0542 10/04/17 0411 10/08/17 0434  WBC 13.1* 9.1 5.4  RBC 3.82* 3.93* 3.75*  HGB 11.3* 11.8* 11.2*  HCT 36.9* 37.6* 34.9*  MCV 96.6 95.7 93.1  MCH 29.6 30.0 29.9  MCHC 30.6 31.4 32.1  RDW 16.5* 16.0* 15.4  PLT 137* 147* 157    Cardiac EnzymesNo results for input(s): TROPONINI in the last 168 hours. No results for input(s): TROPIPOC in the last 168 hours.   BNP Recent Labs Lab 10/02/17 0944  BNP 169.8*     DDimer  Recent Labs Lab 10/02/17 1008  DDIMER 0.82*     Radiology    Dg Chest Port 1 View  Result Date: 10/06/2017 CLINICAL DATA:  Shortness of breath. EXAM: PORTABLE CHEST 1 VIEW COMPARISON:  Chest x-ray dated 10/02/2017 FINDINGS: The heart size and pulmonary vascularity are normal. The lungs are clear except for minimal atelectasis at the lung bases, due to a shallow inspiration. Aortic atherosclerosis. No acute bone abnormality.  Previous thoracic fusion. IMPRESSION: Minimal bibasilar atelectasis, most likely  secondary to a shallow inspiration. Aortic atherosclerosis. Electronically Signed   By: Lorriane Shire M.D.   On: 10/06/2017 11:30    Cardiac Studies     Patient Profile     71 y.o. male with chronic diastolic congestive heart failure and severe COPD.  Assessment & Plan    1. Acute on chronic diastolic congestive heart failure: [name] continues to diurese nicely. His creatinine remained stable. We'll watch his urine output. He may need more than Lasix 20 mg tablets.  2. Hypoxemia: The patient has been hypoxemic even after improvement of his testosterone dysfunction. The pulmonary function test yesterday suggests severe obstructive pulmonary disease. Further plans and management per internal medicine team. He may need a pulmonary consultation.  From a cardiac standpoint, he is stable and can be discharged to rehab. We can see him in several weeks for follow up   For questions or updates, please contact [place] Please consult [email or web address] for contact info under Cardiology/STEMI.      Signed, Mertie Moores, MD  10/08/2017, 9:32 AM

## 2017-10-08 NOTE — Progress Notes (Signed)
Pt admitted to unit at 1540 with daughter at bedside. RN reviewed pt on rehab schedule, rehab process and safety plan with verbal understanding. Pt admitted with walker and CPAP from home. Call bell within reach , daughter at bedside.

## 2017-10-08 NOTE — Progress Notes (Signed)
I have an inpt rehab bed to admit pt to today. I have discussed with Dr. Acie Fredrickson and medically ready from cardiac standpoint. I will clarify with [facility], Dr. Posey Pronto if medically ready for d/c today. I met with pt, as well as his daughter and son at bedside. I have notified RN Cm and will make arrangements to admit today pending Dr. Mamie Nick. Patel's clarification if can d/c today. [phone]

## 2017-10-08 NOTE — Progress Notes (Signed)
Charlett Blake, MD Physician Signed Physical Medicine and Rehabilitation  PMR Pre-admission Date of Service: 10/07/2017 3:35 PM  Related encounter: ED to Hosp-Admission (Current) from 10/02/2017 in [place]       [] Hide copied text PMR Admission Coordinator Pre-Admission Assessment  Patient: Travis [name] is an 71 y.o., male MRN: [id number] DOB: Nov 24, 1946 Height: 6\' 1"  (185.4 cm) Weight: 128.1 kg (282 lb 4.8 oz)                                                                                                                                                  Insurance Information HMO:    PPO: yes     PCP:      IPA:      80/20:      OTHER: Medicare advantage plan PRIMARY: Blue Medicare      Policy#: [id number]      Subscriber: pt CM Name: Cedric      Phone#: [phone]     Fax#: [phone] Pre-Cert#: [id number]    Updates due in 7 days  Employer:self Benefits:  Phone #: [phone]     Name: 10/07/2017 Eff. Date: 12/29/2016     Deduct: none      Out of Pocket Max: [phone]      Life Max: none CIR: $300 co pay per day days 1 through 6      SNF: no co pay days 1-20; $167.50 co pay per day days 21-60; no co pay days 61-100 Outpatient: $40 co pay per visit     Co-Pay: visits per medical neccesity Home Health: 100%      Co-Pay: visits per medical neccesity DME: 80%     Co-Pay: 20% Providers: in network  SECONDARY: none        Medicaid Application Date:       Case Manager:  Disability Application Date:       Case Worker:   Emergency Tax adviser Information    Name Relation Home Work Mobile   Newcom,Carolyn Spouse [phone]  [phone]   [place] Daughter   [phone]     Current Medical History  Patient Admitting Diagnosis: debility  History of Present Illness: HPI: Travis [name] a 71 y.o.malewith history of HTN, CKD, thoraco-cervical myelopathy with quadriparesis, and progressive decline in mobility,  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  He was treated with IV diuresis for acute on chronic diastolic CHF with improvement but continues to have hypoxia with activity. Cardiology question pulmonary component and PFTs revealed severe obstructive lung disease. He has diuresed 6 L and renal status has been stable. Wife admitted yesterday for GB attack requiring surgery. He has had recent workup revealing severe L4-5 spinal  stenosis and was being referred to Dr. Ronnald Ramp for surgical consideration. He has had decline in mobility as well as decline in ability to carry out ADLs CIR recommended for follow up therapy.   Past Medical History      Past Medical History:  Diagnosis Date  . Cervical myelopathy (HCC)    with myelomalacia at C5-6 and C3-4  . CHF (congestive heart failure) ([place])   . Constipation - functional    controlled with miralax  . DJD (degenerative joint disease)    WHOLE SPINE  . Dyspnea   . Gait disorder   . Hypertension   . Hyperthyroidism    Had Iodine to resolve issue DX 10 YR AGO  . Hypothyroidism    Low d/t treatment of hyperthyroidism  . 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 Peripheral neuropathy   . Pneumonia   . PONV (postoperative nausea and vomiting)   . RBBB   . Renal disorder    acute kidney failure  . Restless legs syndrome 03/14/2015  . RLS (restless legs syndrome)   . Sleep apnea    DX  2009/2010 [facility] done at [facility]  . 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 Type II diabetes mellitus (HCC)    diet controlled    Family History  family history includes CAD in his unknown relative; Diabetes in his father; Heart disease in his father; Hypertension in his father; Polycythemia in his mother.  Prior Rehab/Hospitalizations:  Has the patient had major surgery during 100 days prior to admission? No  CIR  2015  Current Medications   Current Facility-Administered Medications:  .  acetaminophen (TYLENOL) tablet 650 mg, 650 mg, Oral, Q6H PRN, Blount, Xenia T, NP, 650 mg at 10/02/17 2103 .  aspirin EC tablet 81 mg, 81 mg, Oral, Daily, Buriev, Arie Sabina, MD, 81 mg at 10/08/17 1021 .  baclofen (LIORESAL) tablet 10 mg, 10 mg, Oral, TID PRN, Daleen Bo, Arie Sabina, MD .  cefpodoxime Bennie Pierini) tablet 200 mg, 200 mg, Oral, Q12H, Patrecia Pour, Edwin, MD, 200 mg at 10/08/17 1022 .  docusate sodium (COLACE) capsule 100 mg, 100 mg, Oral, BID, Lavina Hamman, MD, 100 mg at 10/08/17 1020 .  escitalopram (LEXAPRO) tablet 10 mg, 10 mg, Oral, Daily, Buriev, Arie Sabina, MD, 10 mg at 10/08/17 1022 .  fesoterodine (TOVIAZ) tablet 8 mg, 8 mg, Oral, Daily, Buriev, Arie Sabina, MD, 8 mg at 10/08/17 1023 .  furosemide (LASIX) tablet 20 mg, 20 mg, Oral, BID, Lavina Hamman, MD, 20 mg at 10/08/17 1033 .  gabapentin (NEURONTIN) capsule 400 mg, 400 mg, Oral, TID, Buriev, Arie Sabina, MD, 400 mg at 10/08/17 1022 .  heparin injection 5,000 Units, 5,000 Units, Subcutaneous, Q8H, Patrecia Pour, Christean Grief, MD, 5,000 Units at 10/08/17 [phone] .  hydrALAZINE (APRESOLINE) tablet 25 mg, 25 mg, Oral, Q8H, Buriev, Arie Sabina, MD, 25 mg at 10/08/17 0609 .  insulin aspart (novoLOG) injection 0-9 Units, 0-9 Units, Subcutaneous, TID WC, Buriev, Arie Sabina, MD, 2 Units at 10/08/17 1034 .  levothyroxine (SYNTHROID, LEVOTHROID) tablet 175 mcg, 175 mcg, Oral, QAC breakfast, Kinnie Feil, MD, 175 mcg at 10/08/17 0609 .  menthol-cetylpyridinium (CEPACOL) lozenge 3 mg, 1 lozenge, Oral, PRN, Lavina Hamman, MD .  polyethylene glycol (MIRALAX / GLYCOLAX) packet 17 g, 17 g, Oral, Daily, Lavina Hamman, MD, 17 g at 10/08/17 1026 .  potassium chloride (K-DUR,KLOR-CON) CR tablet 10 mEq, 10 mEq, Oral, BID, Lavina Hamman, MD, 10 mEq at 10/08/17 1022 .  pramipexole (MIRAPEX) tablet 0.5 mg, 0.5 mg, Oral, BID, Buriev, Arie Sabina, MD, 0.5 mg at 10/08/17 1021 .   tamsulosin (FLOMAX) capsule 0.4 mg, 0.4 mg, Oral, Daily, Buriev, Arie Sabina, MD, 0.4 mg at 10/08/17 1023 .  traZODone (DESYREL) tablet 50 mg, 50 mg, Oral, QHS, Blount, Xenia T, NP, 50 mg at 10/07/17 2142  Patients Current Diet: Diet 2 gram sodium Room service appropriate? Yes; Fluid consistency: Thin Diet - low sodium heart healthy Diet Carb Modified  Precautions / Restrictions Precautions Precautions: Fall Precaution Comments: ambulated on RA, sats dropped to 85% briefly when pt stopped walking, returned to 90% with 1 min Restrictions Weight Bearing Restrictions: No   Has the patient had 2 or more falls or a fall with injury in the past year?Yes. 3 falls in 1 week due to legs weak and buckling underneath him when walking  Prior Activity Level Community (5-7x/wk): used RW pta. went to his insurance office 8 am until 1 or 2 daily. Wife drives pt to the office daily and he has a desk/sedentary job. Wife assisted with lower body adls.  Home Assistive Devices / Equipment Home Assistive Devices/Equipment: CPAP Home Equipment: Environmental consultant - 2 wheels, Environmental consultant - 4 wheels, [place] - single point, Bedside commode, Wheelchair - manual, Shower seat - built in, [employer] - tub/shower Patient has old CPAP machine over 61 years old. Had an appointment to be assessed for new CPAP and this one is currently broken. May need to use hospital CPAP until another one is obtained. Daughter to call MD office to inquire into new home CPAP.  Prior Device Use: Indicate devices/aids used by the patient prior to current illness, exacerbation or injury? Walker and lift chair and wheelchair  Prior Functional Level Prior Function Level of Independence: Needs assistance Gait / Transfers Assistance Needed: Amb with RW. Requires assist from wife to stand from chair.  ADL's / Homemaking Assistance Needed: wife has to assist with donning/doffing socks and shoes  Comments: uses WC and lift chair at home  Self Care: Did the  patient need help bathing, dressing, using the toilet or eating?  Needed some help  Indoor Mobility: Did the patient need assistance with walking from room to room (with or without device)? Needed some help  Stairs: Did the patient need assistance with internal or external stairs (with or without device)? Needed some help  Functional Cognition: Did the patient need help planning regular tasks such as shopping or remembering to take medications? Needed some help  Current Functional Level Cognition  Overall Cognitive Status: Within Functional Limits for tasks assessed General Comments: educated pt on benefits of more intensive therapy prior to going home as he requires extensive assist for standing    Extremity Assessment (includes Sensation/Coordination)  Upper Extremity Assessment: Overall WFL for tasks assessed  Lower Extremity Assessment: RLE deficits/detail RLE Deficits / Details: 4- strength RLE hip and ankle, knee 4+ext and flex 3+ RLE Coordination: decreased fine motor, decreased gross motor    ADLs  Overall ADL's : Needs assistance/impaired Eating/Feeding: Set up, Sitting Grooming: Wash/dry hands, Wash/dry face, Oral care, Sitting Upper Body Bathing: Minimal assistance, Sitting Lower Body Bathing: Maximal assistance, Sit to/from stand Upper Body Dressing : Minimal assistance, Sitting Lower Body Dressing: Maximal assistance, Sit to/from stand Toilet Transfer: +2 for physical assistance, Maximal assistance, BSC, RW (to stand from [facility] over toilet) Toilet Transfer Details (indicate cue type and reason): Max assist +2 for sit to stand min assist for functional mobility Toileting- Clothing Manipulation  and Hygiene: Total assistance, +2 for physical assistance, Sit to/from stand Functional mobility during ADLs: Minimal assistance, Rolling walker, +2 for safety/equipment General ADL Comments: Pt is agreeable to going to CIR for further rehab.    Mobility  General bed  mobility comments: Pt up in recliner    Transfers  Overall transfer level: Needs assistance Equipment used: Rolling walker (2 wheeled) Transfers: Sit to/from Stand Sit to Stand: +2 physical assistance, Mod assist, From elevated surface General transfer comment: Assist to bring hips and trunk up. Pt's recliner built up with pillows and blankets for a higher surface but pt still requires assist and incr time and effort to come to stand    Ambulation / Gait / Stairs / Wheelchair Mobility  Ambulation/Gait Ambulation/Gait assistance: Min assist, +2 safety/equipment, Mod assist Ambulation Distance (Feet): 120 Feet Assistive device: Rolling walker (2 wheeled) Gait Pattern/deviations: Decreased step length - left, Trunk flexed, Shuffle, Decreased dorsiflexion - right, Step-through pattern, Decreased step length - right General Gait Details: Pt amb on 2L of O2 with SpO2 >90%. Pt with rt leg with decr ground clearance and scuffing across floor. Verbal cues to stand more upright. Incr fatigue as distance incr with pt requiring incr assist. Gait velocity: reduced Gait velocity interpretation: Below normal speed for age/gender    Posture / Balance Balance Overall balance assessment: Needs assistance Sitting-balance support: Feet supported, No upper extremity supported Sitting balance-Leahy Scale: Good Postural control: Posterior lean Standing balance support: Bilateral upper extremity supported, During functional activity Standing balance-Leahy Scale: Poor Standing balance comment: walker and min assist for static standing    Special needs/care consideration BiPAP/CPAP  yes CPM  [name] Continuous Drip IV  [name] Dialysis n/a Life Vest  [name] Oxygen used home CPAP at HS, was not on home oxygen pta Special Bed  [name] Trach Size  [name] Wound Vac n/a Skin intact  . Dry flaky skin                            Bowel mgmt: continent LBM 10/9 continent Bladder mgmt: external catheter Diabetic mgmt yes  Type II Home CPAP broke in hospital on night of 10/07/17   Previous Home Environment Living Arrangements: Spouse/significant other  Lives With: Spouse Available Help at Discharge: Family, Available 24 hours/day Type of Home: House Home Layout: One level Home Access: Ramped entrance Bathroom Shower/Tub: Multimedia programmer: Handicapped height Bathroom Accessibility: Yes [place]: No Additional Comments: 2 mod assist to stand, wife with pt alone many times  Discharge Living Setting Plans for Discharge Living Setting: Patient's home, Lives with (comment) (wife) Type of Home at Discharge: House Discharge Home Layout: One level Discharge Home Access: [place] entrance Discharge Bathroom Shower/Tub: Walk-in shower Discharge Bathroom Toilet: Handicapped height Discharge Bathroom Accessibility: Yes How Accessible: Accessible via walker Does the patient have any problems obtaining your medications?: No  Social/Family/Support Systems Patient Roles: Spouse, Parent (owns Human resources officer and son works with pt) Contact Information: Greysen Devino, wife Anticipated Caregiver: wife, daughter, daughter in laws and son Anticipated Caregiver's Contact Information: see above Ability/Limitations of Caregiver: wife had gallbladder surgery 10/07/2017 unexpectdely Caregiver Availability: 24/7 Discharge Plan Discussed with Primary Caregiver: Yes Is Caregiver In Agreement with Plan?: Yes Does Caregiver/Family have Issues with Lodging/Transportation while Pt is in Rehab?: No  Goals/Additional Needs Patient/Family Goal for Rehab: supervision PT, supervision to min assist adls Expected length of stay: [facility] 12-16 days; before wife unexpectedly had surgery 10/07/17 pt wanted  short LOS, may now change Pt/Family Agrees to Admission and willing to participate: Yes Program Orientation Provided & Reviewed with Pt/Caregiver Including Roles  & Responsibilities: Yes  Decrease burden of  Care through IP rehab admission: n/a  Possible need for SNF placement upon discharge: not anticipated  Patient Condition: This patient's medical and functional status has changed since the consult dated 10/05/2017 in which the Rehabilitation Physician determined and documented that the patient was potentially appropriate for intensive rehabilitative care in an inpatient rehabilitation facility. Issues have been addressed and update has been discussed with Dr. Letta Pate  and patient now appropriate for inpatient rehabilitation. Patient initially preferred discharged home but, with wife's unexpected surgical procedure and pt's slow functional progress, they have requested an inpt acute rehabilitation admission. Will admit to inpatient rehab today.   Preadmission Screen Completed By:  Cleatrice Burke, 10/08/2017 12:13 PM ______________________________________________________________________   Discussed status with Dr. Letta Pate on 10/08/2017 at  1139 and received telephone approval for admission today.  Admission Coordinator:  Cleatrice Burke, time [id number] Date 10/08/2017.       Revision History

## 2017-10-08 NOTE — Progress Notes (Signed)
Travis Arn, MD Physician Signed Physical Medicine and Rehabilitation  Consult Note Date of Service: 10/05/2017 11:55 AM  Related encounter: ED to Hosp-Admission (Current) from 10/02/2017 in [place] All Collapse All   [] Hide copied text [] Hover for attribution information      Physical Medicine and Rehabilitation Consult  Reason for Consult:  Decline in mobility and ability to carry out ADLs. Referring Physician:    HPI: Travis [name] is a 71 y.o. 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  History taken from chart review and patient. He was treated with IV diuresis for acute on chronic diastolic CHF with improvement but continues to have hypoxia with activity. Therapy evaluations done today revealing decline in mobility and ability to carry out ADLs. CIR recommended for follow up therapy.  Wife reports that he's stronger now.  He had outpatient PT earlier this year. He required assistance with lower body ADLs at baseline.  Patient and wife would prefer [place] with transition to outpatient therapy as he gets stronger.     Review of Systems  HENT: Negative for ear pain and tinnitus.   Eyes: Negative for blurred vision and double vision.  Respiratory: Positive for shortness of breath. Negative for cough.   Cardiovascular: Positive for leg swelling (much improved). Negative for chest pain and palpitations.  Gastrointestinal: Negative for abdominal pain, constipation, heartburn and nausea.  Genitourinary: Positive for frequency and urgency.  Musculoskeletal: Positive for back pain, falls and myalgias.  Skin: Negative for itching and rash.  Neurological: Positive for sensory change and weakness (stronger then past 2  weeks). Negative for dizziness and headaches.  Psychiatric/Behavioral: Negative for memory loss. The patient is nervous/anxious.   All other systems reviewed and are negative.         Past Medical History:  Diagnosis Date  . Cervical myelopathy (HCC)    with myelomalacia at C5-6 and C3-4  . CHF (congestive heart failure) ([place])   . Constipation - functional    controlled with miralax  . DJD (degenerative joint disease)    WHOLE SPINE  . Dyspnea   . Gait disorder   . Hypertension   . Hyperthyroidism    Had Iodine to resolve issue DX 10 YR AGO  . Hypothyroidism    Low d/t treatment of hyperthyroidism  . 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 Peripheral neuropathy   . Pneumonia   . PONV (postoperative nausea and vomiting)   . RBBB   . Renal disorder    acute kidney failure  . Restless legs syndrome 03/14/2015  . RLS (restless legs syndrome)   . Sleep apnea    DX  2009/2010 study done at [facility]  . 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 Type II diabetes mellitus ([place])    diet controlled         Past Surgical History:  Procedure Laterality Date  . BACK SURGERY     6 surgeries  . CARDIAC CATHETERIZATION [name] 09/07/2015   Procedure: Right/Left Heart Cath and Coronary Angiography;  Surgeon: Peter M Martinique, MD;  Location: [place];  Service: Cardiovascular;  Laterality: [name];  . CHOLECYSTECTOMY    . LUMBAR LAMINECTOMY/DECOMPRESSION MICRODISCECTOMY  07/14/2012   Procedure: LUMBAR LAMINECTOMY/DECOMPRESSION MICRODISCECTOMY 2 LEVELS;  Surgeon:  Eustace Moore, MD;  Location: [place] [facility];  Service: Neurosurgery;  Laterality: Left;  Lumbar two three laminectomy, left thoracic four five transpedicular diskectomy         Family History  Problem Relation Age of Onset  . Polycythemia Mother   . Diabetes Father   . Hypertension Father   . Heart disease Father   . CAD Unknown        1 of his 4 brothers and father has CAD     Social History:  Married. Has has decline in mobility for past 6 months but was independent with walker. Per reports that he quit smoking about 32 years ago. His smoking use included Cigarettes. He has a 40.00 pack-year smoking history. He quit smokeless tobacco use about 32 years ago. He reports that he does not drink alcohol or use drugs.         Allergies  Allergen Reactions  . Other Other (See Comments)    Anesthesia makes sick, must have antinausea medication in advance.  . Levofloxacin In D5w Itching    Possible infusion reaction 03/29/17. Tolerating oral Levaquin 03/30/17          Medications Prior to Admission  Medication Sig Dispense Refill  . aspirin EC 81 MG EC tablet Take 1 tablet (81 mg total) by mouth daily. 30 tablet 0  . baclofen (LIORESAL) 10 MG tablet Take 1 tablet (10 mg total) by mouth 3 (three) times daily. For muscle spasms 90 each 1  . escitalopram (LEXAPRO) 10 MG tablet Take 10 mg by mouth daily.  2  . gabapentin (NEURONTIN) 400 MG capsule TAKE 1 CAPSULE (400 MG TOTAL) BY MOUTH EVERY 8 (EIGHT) HOURS. 90 capsule 0  . glimepiride (AMARYL) 2 MG tablet Take 2 mg by mouth daily with breakfast.    . hydrALAZINE (APRESOLINE) 25 MG tablet Take 1 tablet (25 mg total) by mouth every 8 (eight) hours. 90 tablet 0  . levothyroxine (SYNTHROID, LEVOTHROID) 175 MCG tablet Take 175 mcg by mouth daily.  2  . losartan (COZAAR) 50 MG tablet TAKE 1 TABLET BY MOUTH ONCE DAILY. 90 tablet 3  . polyethylene glycol (MIRALAX / GLYCOLAX) packet Take 17 g by mouth daily as needed. For constipation (Patient taking differently: Take 17 g by mouth daily as needed for moderate constipation. ) 14 each 0  . potassium chloride (KLOR-CON 10) 10 MEQ tablet Take 1 tablet (10 mEq total) by mouth 2 (two) times daily. 180 tablet 3  . pramipexole (MIRAPEX) 0.5 MG tablet Take 0.5 mg by mouth 2 (two) times daily.     . tamsulosin (FLOMAX) 0.4 MG CAPS capsule Take 0.4 mg by mouth daily.      [name] torsemide (DEMADEX) 20 MG tablet Take 1 tablet (20 mg total) by mouth 2 (two) times daily. Take additional 10 mg as needed for weight gain or shortness of breath. 60 tablet 11  . TOVIAZ 8 MG TB24 tablet Take 8 mg by mouth daily.  11    Home: Home Living Family/patient expects to be discharged to:: Private residence Living Arrangements: Spouse/significant other Available Help at Discharge: Family, Available 24 hours/day Type of Home: House Home Access: [place]: One level Bathroom Shower/Tub: Multimedia programmer: Handicapped height Bathroom Accessibility: Yes Home Equipment: Environmental consultant - 2 wheels, Environmental consultant - 4 wheels, Cane - single point, Bedside commode, Wheelchair - manual, Shower seat - built in, [employer] - tub/shower Additional Comments: 2 mod assist to stand, wife with pt  alone many times  Functional History: Prior Function Level of Independence: Needs assistance Gait / Transfers Assistance Needed: Amb with RW. Requires assist from wife to stand from chair.  ADL's / Homemaking Assistance Needed: wife has to assist with donning/doffing socks and shoes  Comments: uses WC and lift chair at home Functional Status:  Mobility: Bed Mobility General bed mobility comments: in bathroom upon arrival Transfers Overall transfer level: Needs assistance Equipment used: Rolling walker (2 wheeled), 2 person hand held assist Transfers: Sit to/from Stand Sit to Stand: Mod assist, +2 physical assistance, +2 safety/equipment General transfer comment: use of grab bar and walker to standing, assist to rise and steady, poor static standing tolerance Ambulation/Gait Ambulation/Gait assistance: Min assist, Mod assist, +2 physical assistance, +2 safety/equipment Ambulation Distance (Feet): 40 Feet Assistive device: Rolling walker (2 wheeled), 2 person hand held assist Gait Pattern/deviations: Step-to pattern, Step-through pattern, Decreased stride length, Trunk flexed,  Wide base of support, Shuffle, Decreased weight shift to left, Decreased stance time - left, Decreased dorsiflexion - left General Gait Details: LLE is weak per pt but strength testing reveals RLE is weak Gait velocity: reduced Gait velocity interpretation: Below normal speed for age/gender  ADL: ADL Overall ADL's : Needs assistance/impaired Eating/Feeding: Set up, Sitting Grooming: Set up, Sitting, Oral care (shaving) Upper Body Bathing: Minimal assistance, Sitting Lower Body Bathing: Maximal assistance, Sit to/from stand Upper Body Dressing : Minimal assistance, Sitting Lower Body Dressing: Maximal assistance, Sit to/from stand Toilet Transfer: +2 for physical assistance, Maximal assistance, BSC, RW (to stand from [facility] over toilet) Toilet Transfer Details (indicate cue type and reason): Max assist +2 for sit to stand min assist for functional mobility Toileting- Clothing Manipulation and Hygiene: Total assistance, +2 for physical assistance, Sit to/from stand Functional mobility during ADLs: Minimal assistance, Rolling walker, +2 for safety/equipment General ADL Comments: pt holds breath during exertion, 02 sats dropped to 83%, but rebounded to low 90s quickly once seated  Cognition: Cognition Overall Cognitive Status: Within Functional Limits for tasks assessed Cognition Arousal/Alertness: Awake/alert Behavior During Therapy: WFL for tasks assessed/performed Overall Cognitive Status: Within Functional Limits for tasks assessed General Comments: educated pt on benefits of more intensive therapy prior to going home as he requires extensive assist for standing  Blood pressure (!) 157/56, pulse 60, temperature 98.7 F (37.1 C), temperature source Oral, resp. rate 19, height 6\' 1"  (1.854 m), weight 128.2 kg (282 lb 10.1 oz), SpO2 (!) 88 %. Physical Exam  Nursing note and vitals reviewed. Constitutional: He is oriented to person, place, and time. He appears well-developed and  well-nourished.  Up in chair--NAD and reports feeling better than past few weeks.   HENT:  Head: Normocephalic and atraumatic.  Eyes: Pupils are equal, round, and reactive to light. Conjunctivae and EOM are normal.  Neck: Normal range of motion. Neck supple.  Cardiovascular: Normal rate and regular rhythm.   Respiratory: Effort normal. No stridor. He has decreased breath sounds.  GI: Soft. Bowel sounds are normal. He exhibits no distension. There is no tenderness. There is no rebound.  Musculoskeletal: He exhibits edema. He exhibits no tenderness.  2+ edema BLE.  Healing abrasions bilateral knees.   Neurological: He is alert and oriented to person, place, and time.  Motor: 4/5 grossly throughout  Skin: Skin is warm and dry.  Psychiatric: He has a normal mood and affect. His behavior is normal. Thought content normal.    Lab Results Last 24 Hours       Results for orders placed  or performed during the hospital encounter of 10/02/17 (from the past 24 hour(s))  Glucose, capillary     Status: Abnormal   Collection Time: 10/04/17 12:49 PM  Result Value Ref Range   Glucose-Capillary 184 (H) 65 - 99 mg/dL  Glucose, capillary     Status: Abnormal   Collection Time: 10/04/17  4:40 PM  Result Value Ref Range   Glucose-Capillary 140 (H) 65 - 99 mg/dL  Glucose, capillary     Status: Abnormal   Collection Time: 10/04/17  8:06 PM  Result Value Ref Range   Glucose-Capillary 146 (H) 65 - 99 mg/dL  Glucose, capillary     Status: Abnormal   Collection Time: 10/05/17  8:16 AM  Result Value Ref Range   Glucose-Capillary 142 (H) 65 - 99 mg/dL  Basic metabolic panel     Status: Abnormal   Collection Time: 10/05/17  8:23 AM  Result Value Ref Range   Sodium 139 135 - 145 mmol/L   Potassium 4.0 3.5 - 5.1 mmol/L   Chloride 104 101 - 111 mmol/L   CO2 26 22 - 32 mmol/L   Glucose, Bld 138 (H) 65 - 99 mg/dL   BUN 32 (H) 6 - 20 mg/dL   Creatinine, Ser 1.24 0.61 - 1.24 mg/dL    Calcium 8.9 8.9 - 10.3 mg/dL   GFR calc non Af Amer 56 (L) >60 mL/min   GFR calc Af Amer >60 >60 mL/min   Anion gap 9 5 - 15     Imaging Results (Last 48 hours)  No results found.    Assessment/Plan: Diagnosis: Debility Labs independently reviewed.  Records reviewed and summated above.  1. Does the need for close, 24 hr/day medical supervision in concert with the patient's rehab needs make it unreasonable for this patient to be served in a less intensive setting? Potentially 2. 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 Due to safety, disease management and patient education, does the patient require 24 hr/day rehab nursing? Potentially 4. Does the patient require coordinated care of a physician, rehab nurse, PT (1-2 hrs/day, 5 days/week) and OT (1-2 hrs/day, 5 days/week) to address physical and functional deficits in the context of the above medical diagnosis(es)? Yes Addressing deficits in the following areas: balance, endurance, locomotion, strength, transferring, bowel/bladder control, bathing, dressing, toileting and psychosocial support 5. Can the patient actively participate in an intensive therapy program of at least 3 hrs of therapy per day at least 5 days per week? Yes 6. The potential for patient to make measurable gains while on inpatient rehab is excellent 7. Anticipated functional outcomes upon discharge from inpatient rehab are supervision  with PT, supervision and min  assist with OT, n/a with SLP. 8. Estimated rehab length of stay to reach the above functional goals is: 12-16 days, 9. Anticipated D/C setting: Home 10. Anticipated post D/C treatments: HH therapy and Home excercise program 11. Overall Rehab/Functional Prognosis: good  RECOMMENDATIONS: This patient's condition is appropriate for continued rehabilitative care in the following setting: Patient and wife prefer [place] and believe patient is making daily fucntional gains.  If patient continues to progress, believe this is appropriate, otherwise  may benefit from CIR. Patient has agreed to participate in recommended program. Potentially Note that insurance prior authorization may be required for reimbursement for recommended care.  Comment: Rehab Admissions Coordinator to follow up.  Delice Lesch, MD, ABPMR [place] [place], [place] 10/05/2017    Revision History                        Routing History

## 2017-10-08 NOTE — Progress Notes (Signed)
RT NOTE:  Pt has home CPAP unit setup @ bedside. Unit looks good, wiring without defect. RT gave patient new CPAP tubing. Sterile water added to humidifier. Pt is able to put cpap on when he is ready.

## 2017-10-08 NOTE — Discharge Summary (Signed)
[facility] Discharge Summary   Patient: Travis [name] NWG:[id number]   PCP: Travis Arabian, MD DOB: 1946/03/18   Date of admission: 10/02/2017   Date of discharge:  10/08/2017    Discharge Diagnoses:  Active Problems:   Cervical spondylosis with myelopathy   HTN (hypertension)   Acute lower UTI   Acute diastolic heart failure (HCC)   DOE (dyspnea on exertion)   Acute on chronic diastolic CHF (congestive heart failure) (HCC)   Fever   Benign essential HTN   Stage 3 chronic kidney disease (HCC)   Spinal stenosis of lumbar region   OSA (obstructive sleep apnea)   Admitted From: home Disposition:  CIR  Recommendations for Outpatient Follow-up:  1. Please follow-up with PCP in one week. 2. Patient will benefit from establishing care with pulmonary, Epic order for referral in place.  Follow-up Information    Travis Arabian, MD. Schedule an appointment as soon as possible for a visit in 1 week(s).   Specialty:  Family Medicine Contact information: [address] [place] [address] [phone]        [facility]. Call in 1 week(s).   Specialty:  Pulmonology Why:  to establish care.  Contact information: [place] [phone]         Diet recommendation: Cardiac diet carb modified  Activity: The patient is advised to gradually reintroduce usual activities.  Discharge Condition: good  Code Status: Full code  History of present illness: As per the H and P dictated on admission, "Travis [name] is a 71 y.o. male with PMH of dHF, HTN, CKD, h/o Tobacco  Use, DM, Recent UTI (august) presented with fever. Patient states that he woke up in the morning with fever at 103. Then he had an episode of nausea, vomiting. Denies abdominal pains, no hematemesis. He also reports shortness of breath, dyspnea on exertion associated with progressive bilateral leg edema. He reports mild non productive cough  which started today. denies acute chest pains, no dizziness. He had similar symptoms in the past with sepsis, urinary tract infection and pneumonias. He was treated with urinary tract infection in august, with levofloxacin per his daughter in law. He is being evaluated by neurology for neuropathy as outpatient. -ED: ct chest was neg for PE. Febrile. Started on iv ceftriaxone for UTI. hospitalits is called for admission."  Hospital Course:  Summary of his active problems in the hospital is as following. UTI  > 100,000 Citrobacter  Initially treated with Rocephin, transitioned to Vantin to complete 7 days of abx.  Blood cultures no growth to date  WBC normalized  Continue to monitor  Acute on chronic diastolic CHF  Exacerbated by infectious process and aggressive hydration Cardiology recommending PFT to r/o pulmonary component -  Patient was given IV Lasix, transitioned to by mouth Lasix, dose increased per cardiology. -6 L, weight initially went down to 276 pounds, trended up in the last 2 days after switching and reducing the dose of Lasix and therefore Lasix dose was increased to 20 mg twice a day. Continue Ted Hose   Acute respiratory failure with hypoxia due to volume overload  improved  Responded well to IV lasix and O2  Patient was started on azithromycin felt to have early PNA will d/c no PNA on CXR.  Wean O2 as tolerated  Cardiology was consulted regarding possible pulmonary component as well as respiratory muscular weakness due to patient's aggressive neuropathy. NIF is actually 60  which appears adequate. Also based on the PFT patient has severe COPD and increased DLCO. The DLCO increases more likely due to CHF rather than any other acute abnormality. Patient will benefit from outpatient referral to pulmonary for further treatment for COPD. Currently I will start the patient on albuterol when necessary, dulera, Spiriva.  Acute renal failure on CKD stage III - stable -  Cardiorenal syndrome  Cr at baseline  Creatinine stable, monitor while on Lasix Renal US no acute process although shows hypoechoic lesion od 2 cm - need MRI as outpatient   OSA  CPAP at night   HTN  BP stable On hydralazine, losartan and Lasix Continue to monitor   Deconditioning Peripheral neuropathy Patient has progressive peripheral neuropathy. Recently had EMG/NCV with neurology for the same. Workup on outpatient basis was unremarkable other than peripheral neuropathy. Currently physical therapy recommending CIR  All other chronic medical condition were stable during the hospitalization.  Patient was seen by physical therapy, who recommended CIR, which was arranged by Education officer, museum and case Freight forwarder. On the day of the discharge the patient's vitals were stable , and no other acute medical condition were reported by patient. the patient was felt safe to be discharge at [facility] with rehab.  Procedures and Results:  Echocardiogram   PFT   Consultations:  Cardiology   DISCHARGE MEDICATION: Current Discharge Medication List    START taking these medications   Details  albuterol (PROVENTIL HFA;VENTOLIN HFA) 108 (90 Base) MCG/ACT inhaler Inhale 2 puffs into the lungs every 6 (six) hours as needed for wheezing or shortness of breath. Qty: 1 Inhaler, Refills: 2    cefpodoxime (VANTIN) 200 MG tablet Take 1 tablet (200 mg total) by mouth every 12 (twelve) hours. Qty: 1 tablet, Refills: 0    docusate sodium (COLACE) 100 MG capsule Take 1 capsule (100 mg total) by mouth 2 (two) times daily. Qty: 10 capsule, Refills: 0    furosemide (LASIX) 20 MG tablet Take 1 tablet (20 mg total) by mouth 2 (two) times daily. Qty: 30 tablet, Refills: 0    mometasone-formoterol (DULERA) 200-5 MCG/ACT AERO Inhale 2 puffs into the lungs 2 (two) times daily. Qty: 1 Inhaler, Refills: 0    tiotropium (SPIRIVA HANDIHALER) 18 MCG inhalation capsule Place 1 capsule (18 mcg total) into inhaler and  inhale daily. Qty: 30 capsule, Refills: 0    traZODone (DESYREL) 50 MG tablet Take 1 tablet (50 mg total) by mouth at bedtime. Qty: 30 tablet, Refills: 0      CONTINUE these medications which have NOT CHANGED   Details  aspirin EC 81 MG EC tablet Take 1 tablet (81 mg total) by mouth daily. Qty: 30 tablet, Refills: 0    baclofen (LIORESAL) 10 MG tablet Take 1 tablet (10 mg total) by mouth 3 (three) times daily. For muscle spasms Qty: 90 each, Refills: 1   Associated Diagnoses: Muscle spasms of both lower extremities    escitalopram (LEXAPRO) 10 MG tablet Take 10 mg by mouth daily. Refills: 2    gabapentin (NEURONTIN) 400 MG capsule TAKE 1 CAPSULE (400 MG TOTAL) BY MOUTH EVERY 8 (EIGHT) HOURS. Qty: 90 capsule, Refills: 0    glimepiride (AMARYL) 2 MG tablet Take 2 mg by mouth daily with breakfast.    hydrALAZINE (APRESOLINE) 25 MG tablet Take 1 tablet (25 mg total) by mouth every 8 (eight) hours. Qty: 90 tablet, Refills: 0    levothyroxine (SYNTHROID, LEVOTHROID) 175 MCG tablet Take 175 mcg by mouth daily. Refills:  2    polyethylene glycol (MIRALAX / GLYCOLAX) packet Take 17 g by mouth daily as needed. For constipation Qty: 14 each, Refills: 0    potassium chloride (KLOR-CON 10) 10 MEQ tablet Take 1 tablet (10 mEq total) by mouth 2 (two) times daily. Qty: 180 tablet, Refills: 3    pramipexole (MIRAPEX) 0.5 MG tablet Take 0.5 mg by mouth 2 (two) times daily.     tamsulosin (FLOMAX) 0.4 MG CAPS capsule Take 0.4 mg by mouth daily.     TOVIAZ 8 MG TB24 tablet Take 8 mg by mouth daily. Refills: 11      STOP taking these medications     losartan (COZAAR) 50 MG tablet      torsemide (DEMADEX) 20 MG tablet        Allergies  Allergen Reactions  . Other Other (See Comments)    Anesthesia makes sick, must have antinausea medication in advance.  . Levofloxacin In D5w Itching    Possible infusion reaction 03/29/17. Tolerating oral Levaquin 03/30/17   Discharge Instructions     Ambulatory referral to Pulmonology    Complete by:  As directed    Diet - low sodium heart healthy    Complete by:  As directed    Diet Carb Modified    Complete by:  As directed    Discharge instructions    Complete by:  As directed    It is important that you read following instructions as well as go over your medication list with RN to help you understand your care after this hospitalization.  Discharge Instructions: Please follow-up with PCP in one week  Please request your primary care physician to go over all Hospital Tests and Procedure/Radiological results at the follow up,  Please get all Hospital records sent to your PCP by signing hospital release before you go home.   Do not take more than prescribed Pain, Sleep and Anxiety Medications. You were cared for by a hospitalist during your hospital stay. If you have any questions about your discharge medications or the care you received while you were in the hospital after you are discharged, you can call the unit and ask to speak with the hospitalist on call if the hospitalist that took care of you is not available.  Once you are discharged, your primary care physician will handle any further medical issues. Please note that NO REFILLS for any discharge medications will be authorized once you are discharged, as it is imperative that you return to your primary care physician (or establish a relationship with a primary care physician if you do not have one) for your aftercare needs so that they can reassess your need for medications and monitor your lab values. You Must read complete instructions/literature along with all the possible adverse reactions/side effects for all the Medicines you take and that have been prescribed to you. Take any new Medicines after you have completely understood and accept all the possible adverse reactions/side effects. Wear Seat belts while driving. If you have smoked or chewed Tobacco in the last 2 yrs  please stop smoking and/or stop any Recreational drug use.   Increase activity slowly    Complete by:  As directed      Discharge Exam: Filed Weights   10/06/17 0354 10/07/17 0534 10/08/17 0500  Weight: 125.4 kg (276 lb 7.3 oz) 127 kg (280 lb) 128.1 kg (282 lb 4.8 oz)   Vitals:   10/08/17 1139 10/08/17 1456  BP: (!) 165/75 139/65  Pulse: 77   Resp: 18   Temp: 98 F (36.7 C)   SpO2: 91%    General: Appear in mild distress, no Rash; Oral Mucosa moist. Cardiovascular: S1 and S2 Present, no Murmur, no JVD Respiratory: Bilateral Air entry present and basal Crackles, no wheezes Abdomen: Bowel Sound present, Soft and no tenderness Extremities: bilateral Pedal edema, no calf tenderness Neurology: Grossly no focal neuro deficit.  The results of significant diagnostics from this hospitalization (including imaging, microbiology, ancillary and laboratory) are listed below for reference.    Significant Diagnostic Studies: Ct Angio Chest Pe W/cm &/or Wo Cm  Result Date: 10/02/2017 CLINICAL DATA:  Suspect pulmonary embolus. Intermediate probability. EXAM: CT ANGIOGRAPHY CHEST WITH CONTRAST TECHNIQUE: Multidetector CT imaging of the chest was performed using the standard protocol during bolus administration of intravenous contrast. Multiplanar CT image reconstructions and MIPs were obtained to evaluate the vascular anatomy. CONTRAST:  80 cc of Isovue 370 COMPARISON:  None FINDINGS: Cardiovascular: Exam detail is diminished secondary to suboptimal pulmonary arterial opacification and respiratory motion artifact. The main pulmonary artery appears patent. The main pulmonary artery measures 4.1 cm in diameter suggesting PA hypertension. No saddle embolus. No proximal lobar pulmonary artery filling defects identified. Beyond the level of the proximal pulmonary arteries there is significantly diminished pulmonary artery detail limiting evaluation. Normal heart size. Aortic atherosclerosis. Calcification in  the LAD and left circumflex and RCA coronary artery is noted. Mediastinum/Nodes: The trachea appears patent and is midline. Normal appearance of the esophagus. No mediastinal or hilar adenopathy. No axillary or supraclavicular adenopathy. Lungs/Pleura: No pleural effusion identified. Subsegmental atelectasis involving the right lower lobe and left base identified. There is asymmetric elevation of the right hemidiaphragm. Upper Abdomen: No acute abnormality. Musculoskeletal: Previous pedicle screw and rod fixation of the lower thoracic spine. Review of the MIP images confirms the above findings. IMPRESSION: 1. Exam detail diminished secondary to suboptimal pulmonary arterial opacification and respiratory motion artifact. No saddle embolus or proximal lobar pulmonary artery filling defects identified. 2. Increase caliber of the main pulmonary artery suggestive of PA hypertension. 3. Bilateral lower lobe subsegmental atelectasis and asymmetric elevation of the right hemidiaphragm. 4. Aortic Atherosclerosis (ICD10-I70.0). Coronary artery calcifications noted involving the LAD, left circumflex and RCA. Electronically Signed   By: Kerby Moors M.D.   On: 10/02/2017 14:26   Ct Cervical Spine W Contrast  Result Date: 09/11/2017 CLINICAL DATA:  Cervical spondylosis with myelopathy. Abnormality of gait. FLUOROSCOPY TIME:  Radiation Exposure Index (as provided by the fluoroscopic device): 699.37 uGy*m2 Fluoroscopy Time:  1 minutes 15 seconds Number of Acquired Images:  18 PROCEDURE: LUMBAR PUNCTURE FOR CERVICAL AND THORACIC MYELOGRAM After thorough discussion of risks and benefits of the procedure including bleeding, infection, injury to nerves, blood vessels, adjacent structures as well as headache and CSF leak, written and oral informed consent was obtained. Consent was obtained by Dr. San Morelle. Patient was positioned prone on the fluoroscopy table. Local anesthesia was provided with 1% lidocaine without  epinephrine after prepped and draped in the usual sterile fashion. Puncture was performed at L1-2 using a 3 1/2 inch 22-gauge spinal needle via a left paramedian approach. Using a single pass through the dura, the needle was placed within the thecal sac, with return of clear CSF. 10 mL of Isovue M 300 was injected into the thecal sac, with normal opacification of the nerve roots and cauda equina consistent with free flow within the subarachnoid space. The patient was then moved to the trendelenburg position and  contrast flowed into the Thoracic spine region. I personally performed the lumbar puncture and administered the intrathecal contrast. I also personally supervised acquisition of the myelogram images. TECHNIQUE: Contiguous axial images were obtained through the Cervical and Thoracic spine after the intrathecal infusion of infusion. Coronal and sagittal reconstructions were obtained of the axial image sets. FINDINGS: CERVICAL AND THORACIC MYELOGRAM FINDINGS: A broad-based disc protrusion and endplate spurring associated slight retrolisthesis is present at L2-3. There is a relative block contrast extending into the lumbar spine area. Posterior fusion hardware is present at T7-8, T8-9, and T9-10. Ventral disc disease is present at T2-3 and T4-5. The There is ventral narrowing at C7-8. Posterior cervical spine hardware is noted C2-T1. Contrast somewhat faint on the conventional images. CT CERVICAL MYELOGRAM FINDINGS: The cervical spine is imaged from the skullbase through T1-2. There is fusion across the disc spaces C2-T1. Extensive laminectomy is noted C3- C6. Chronic ventral depression fracture is present at C6. C2-3: Wide laminectomy is present. The central canal is decompressed. Mild left foraminal narrowing is due to uncovertebral and facet disease. C3-4: Wide laminectomy decompresses the central canal. Mild osseous foraminal narrowing is present bilaterally. C4-5: A wide laminectomy is present. Moderate  osseous foraminal narrowing is worse on the right. The central canal is patent. C5-6: Wide laminectomy decompresses the central canal. Mild foraminal narrowing is present bilaterally. C6-7: Ventral calcification at the disc level level effaces the ventral CSF and contacts the ventral surface of the cord. Moderate left and mild right foraminal narrowing is present. C7-T1: Solid fusion anteriorly and posteriorly is present without significant stenosis. T1-2: A prominent right paramedian soft disc protrusion effaces the ventral CSF and slightly distorts the right ventral surface of the cord. Severe left and moderate right foraminal stenosis is present. CT THORACIC MYELOGRAM FINDINGS: Posterior pedicle screw and rod fixation is noted bilaterally at T7-8, T8-9, and T9-10. There is fusion across the disc space at T8-9. There is fusion across posterior elements at T7-8. No definite fusion is present at T9-10. AP alignment is anatomic. Atherosclerotic calcifications are present in the aorta and branch vessels without aneurysm. The paraspinous soft tissues are otherwise unremarkable. T1-2: As above. T2-3: A broad-based disc osteophyte complex partially effaces the ventral CSF. Severe foraminal stenosis is present bilaterally, due to uncovertebral and facet disease. T3-4: A left paramedian disc protrusion partially effaces the ventral CSF. The central canal is patent. Mild foraminal narrowing is noted bilaterally. T4-5: A shallow central disc protrusion partially effaces the ventral CSF. Moderate left osseous foraminal narrowing is present. T5-6: A shallow right paramedian disc protrusion partially effaces the ventral CSF. Mild osseous foraminal narrowing is present bilaterally. T6-7: A shallow right paramedian disc protrusion partially effaces the ventral CSF. The foramina are patent bilaterally. T7-8: Posterior inferior osteophyte formation and effaces the ventral CSF and slightly distorts the ventral surface the cord. The  foramina are patent. T8-9: A central calcified disc is associated with the fusion. This effaces the ventral CSF and flattens the ventral surface of the cord. The foramina are patent. Left laminectomy and partial foraminotomy is noted. T9-10: The right laminectomy decompresses the central canal. Residual right paramedian calcified disc protrusion effaces the ventral CSF. Moderate right foraminal narrowing is present. T10-11: Moderate facet hypertrophy is present bilaterally. This contributes to mild bilateral foraminal narrowing. The central canal is patent. T11-12: Central disc calcification. No posterior protrusion or stenosis is present. T12-L1: Mild facet hypertrophy is present bilaterally without significant stenosis. IMPRESSION: 1. Solid osseous fusion C2-T1. 2. The  most significant cervical central canal stenosis is at C6-7 where ventral calcifications at the disc level effaces the ventral CSF and contacts the ventral surface of the cord. 3. Mild left foraminal narrowing at C2-3. 4. Mild osseous foraminal narrowing bilaterally at C3-4. 5. Moderate osseous foraminal narrowing at C4-5 is worse on the right. 6. Mild foraminal narrowing bilaterally at C5-6. 7. Moderate left and mild right foraminal narrowing at C6-7. 8. Moderate right central canal stenosis with severe left and moderate right foraminal narrowing at T1-2. 9. Mild central canal narrowing with severe foraminal narrowing bilaterally at T2-3. 10. Mild foraminal narrowing bilaterally at T3-4. 11. Moderate left foraminal narrowing at C4-5. 12. Mild osseous foraminal narrowing bilaterally at C5-6. 13. Fusion at T7-8 and T8-9. 14. Posterior pedicle screw and rod fixation at T9-10 without definite fusion. 15. Shallow right paramedian disc protrusion and mild central canal narrowing at T6-7. 16. Moderate residual central canal stenosis at T7-8 secondary to a posterior inferior osteophyte. 17. Central calcified disc at T8-9 with moderate central canal  stenosis. 18. Moderate right central and foraminal narrowing at T9-10. 19. Mild foraminal narrowing bilaterally at T10-11. Electronically Signed   By: San Morelle M.D.   On: 09/11/2017 13:26   Ct Thoracic Spine W Contrast  Result Date: 09/11/2017 CLINICAL DATA:  Cervical spondylosis with myelopathy. Abnormality of gait. FLUOROSCOPY TIME:  Radiation Exposure Index (as provided by the fluoroscopic device): 699.37 uGy*m2 Fluoroscopy Time:  1 minutes 15 seconds Number of Acquired Images:  18 PROCEDURE: LUMBAR PUNCTURE FOR CERVICAL AND THORACIC MYELOGRAM After thorough discussion of risks and benefits of the procedure including bleeding, infection, injury to nerves, blood vessels, adjacent structures as well as headache and CSF leak, written and oral informed consent was obtained. Consent was obtained by Dr. San Morelle. Patient was positioned prone on the fluoroscopy table. Local anesthesia was provided with 1% lidocaine without epinephrine after prepped and draped in the usual sterile fashion. Puncture was performed at L1-2 using a 3 1/2 inch 22-gauge spinal needle via a left paramedian approach. Using a single pass through the dura, the needle was placed within the thecal sac, with return of clear CSF. 10 mL of Isovue M 300 was injected into the thecal sac, with normal opacification of the nerve roots and cauda equina consistent with free flow within the subarachnoid space. The patient was then moved to the trendelenburg position and contrast flowed into the Thoracic spine region. I personally performed the lumbar puncture and administered the intrathecal contrast. I also personally supervised acquisition of the myelogram images. TECHNIQUE: Contiguous axial images were obtained through the Cervical and Thoracic spine after the intrathecal infusion of infusion. Coronal and sagittal reconstructions were obtained of the axial image sets. FINDINGS: CERVICAL AND THORACIC MYELOGRAM FINDINGS: A  broad-based disc protrusion and endplate spurring associated slight retrolisthesis is present at L2-3. There is a relative block contrast extending into the lumbar spine area. Posterior fusion hardware is present at T7-8, T8-9, and T9-10. Ventral disc disease is present at T2-3 and T4-5. The There is ventral narrowing at C7-8. Posterior cervical spine hardware is noted C2-T1. Contrast somewhat faint on the conventional images. CT CERVICAL MYELOGRAM FINDINGS: The cervical spine is imaged from the skullbase through T1-2. There is fusion across the disc spaces C2-T1. Extensive laminectomy is noted C3- C6. Chronic ventral depression fracture is present at C6. C2-3: Wide laminectomy is present. The central canal is decompressed. Mild left foraminal narrowing is due to uncovertebral and facet disease. C3-4: Wide laminectomy decompresses  the central canal. Mild osseous foraminal narrowing is present bilaterally. C4-5: A wide laminectomy is present. Moderate osseous foraminal narrowing is worse on the right. The central canal is patent. C5-6: Wide laminectomy decompresses the central canal. Mild foraminal narrowing is present bilaterally. C6-7: Ventral calcification at the disc level level effaces the ventral CSF and contacts the ventral surface of the cord. Moderate left and mild right foraminal narrowing is present. C7-T1: Solid fusion anteriorly and posteriorly is present without significant stenosis. T1-2: A prominent right paramedian soft disc protrusion effaces the ventral CSF and slightly distorts the right ventral surface of the cord. Severe left and moderate right foraminal stenosis is present. CT THORACIC MYELOGRAM FINDINGS: Posterior pedicle screw and rod fixation is noted bilaterally at T7-8, T8-9, and T9-10. There is fusion across the disc space at T8-9. There is fusion across posterior elements at T7-8. No definite fusion is present at T9-10. AP alignment is anatomic. Atherosclerotic calcifications are present  in the aorta and branch vessels without aneurysm. The paraspinous soft tissues are otherwise unremarkable. T1-2: As above. T2-3: A broad-based disc osteophyte complex partially effaces the ventral CSF. Severe foraminal stenosis is present bilaterally, due to uncovertebral and facet disease. T3-4: A left paramedian disc protrusion partially effaces the ventral CSF. The central canal is patent. Mild foraminal narrowing is noted bilaterally. T4-5: A shallow central disc protrusion partially effaces the ventral CSF. Moderate left osseous foraminal narrowing is present. T5-6: A shallow right paramedian disc protrusion partially effaces the ventral CSF. Mild osseous foraminal narrowing is present bilaterally. T6-7: A shallow right paramedian disc protrusion partially effaces the ventral CSF. The foramina are patent bilaterally. T7-8: Posterior inferior osteophyte formation and effaces the ventral CSF and slightly distorts the ventral surface the cord. The foramina are patent. T8-9: A central calcified disc is associated with the fusion. This effaces the ventral CSF and flattens the ventral surface of the cord. The foramina are patent. Left laminectomy and partial foraminotomy is noted. T9-10: The right laminectomy decompresses the central canal. Residual right paramedian calcified disc protrusion effaces the ventral CSF. Moderate right foraminal narrowing is present. T10-11: Moderate facet hypertrophy is present bilaterally. This contributes to mild bilateral foraminal narrowing. The central canal is patent. T11-12: Central disc calcification. No posterior protrusion or stenosis is present. T12-L1: Mild facet hypertrophy is present bilaterally without significant stenosis. IMPRESSION: 1. Solid osseous fusion C2-T1. 2. The most significant cervical central canal stenosis is at C6-7 where ventral calcifications at the disc level effaces the ventral CSF and contacts the ventral surface of the cord. 3. Mild left foraminal  narrowing at C2-3. 4. Mild osseous foraminal narrowing bilaterally at C3-4. 5. Moderate osseous foraminal narrowing at C4-5 is worse on the right. 6. Mild foraminal narrowing bilaterally at C5-6. 7. Moderate left and mild right foraminal narrowing at C6-7. 8. Moderate right central canal stenosis with severe left and moderate right foraminal narrowing at T1-2. 9. Mild central canal narrowing with severe foraminal narrowing bilaterally at T2-3. 10. Mild foraminal narrowing bilaterally at T3-4. 11. Moderate left foraminal narrowing at C4-5. 12. Mild osseous foraminal narrowing bilaterally at C5-6. 13. Fusion at T7-8 and T8-9. 14. Posterior pedicle screw and rod fixation at T9-10 without definite fusion. 15. Shallow right paramedian disc protrusion and mild central canal narrowing at T6-7. 16. Moderate residual central canal stenosis at T7-8 secondary to a posterior inferior osteophyte. 17. Central calcified disc at T8-9 with moderate central canal stenosis. 18. Moderate right central and foraminal narrowing at T9-10. 19. Mild  foraminal narrowing bilaterally at T10-11. Electronically Signed   By: San Morelle M.D.   On: 09/11/2017 13:26   Ct Lumbar Spine Wo Contrast  Result Date: 10/01/2017 CLINICAL DATA:  Chronic low back pain. Unsteady gait. Previous spinal fusion. EXAM: CT LUMBAR SPINE WITHOUT CONTRAST TECHNIQUE: Multidetector CT imaging of the lumbar spine was performed without intravenous contrast administration. Multiplanar CT image reconstructions were also generated. COMPARISON:  MRI 07/05/2012. FINDINGS: Segmentation: 5 lumbar type vertebral bodies. Alignment: Straightening of the normal lumbar lordosis with slight kyphotic curvature. 2 mm retrolisthesis L2-3. 3 mm retrolisthesis L3-4. 4 mm retrolisthesis L4-5. Vertebrae: Chronic mild endplate deformity at the L2-3 level. No other focal finding or evidence of fracture. Paraspinal and other soft tissues: Aortic atherosclerosis. No aneurysm. Disc  levels: T12-L1: Some disc calcification. Mild annular bulging. Mild facet arthritis. No compressive stenosis suspected. L1-2: Mild bulging of the disc. Mild facet and ligamentous hypertrophy. Mild multifactorial stenosis without visible neural compression. L2-3: Previous posterior decompression. 2 mm retrolisthesis. Chronic disc degeneration with complete loss of disc height and vacuum phenomenon. Circumferential endplate osteophytes. Mild facet hypertrophy. Stenosis of the lateral recesses and neural foramina on each side. L3-4: Previous posterior decompression. 3 mm retrolisthesis. Chronic disc degeneration with complete loss of disc height and vacuum phenomenon. Endplate osteophytes. Stenosis of both lateral recesses and neural foramina. L4-5: Previous posterior decompression. 4 mm retrolisthesis. Disc degeneration with mild disc space narrowing and vacuum phenomenon. Endplate osteophytes and circumferential protrusion of the disc. Facet and ligamentous hypertrophy. Probable severe stenosis immediately below the decompression. Stenosis also affects the lateral recesses and neural foramina. L5-S1: Endplate osteophytes and shallow protrusion of the disc. No compressive stenosis of the central canal. Mild bilateral foraminal narrowing without definite compression of the exiting L5 nerves. IMPRESSION: Posterior decompression at L2-3, L3-4 and L4-5. Retrolisthesis at all those levels. Lateral recess and foraminal narrowing at L2-3 and L3-4 that could cause neural compression. Likely severe multifactorial spinal stenosis at and just below the L4-5 level. Lateral recess and foraminal stenosis as well. Neural compression seems likely at this level. L5-S1 endplate osteophytes and disc protrusion without apparent compressive stenosis. Mild foraminal narrowing. Electronically Signed   By: Nelson Chimes M.D.   On: 10/01/2017 11:47   US Renal  Result Date: 10/02/2017 CLINICAL DATA:  71 year old male with history of acute  kidney injury. EXAM: RENAL / URINARY TRACT ULTRASOUND COMPLETE COMPARISON:  Abdominal ultrasound 07/14/2017. FINDINGS: Right Kidney: Length: 10.3 cm. Echogenicity within normal limits. In the lower pole of the left kidney there is a 2.2 x 2.0 x 2.0 cm hypoechoic lesion. No definite increase through transmission appreciated associated with this lesion. No definite internal blood flow. No hydronephrosis visualized. Left Kidney: Length: 10.2 cm. Echogenicity within normal limits. No mass or hydronephrosis visualized. Bladder: Completely decompressed with Foley balloon catheter in place. IMPRESSION: 1. No acute findings.  Specifically, no hydroureteronephrosis. 2. Indeterminate lesion in the lower pole of the left kidney measuring 2.2 x 2.0 x 2.0 cm. This is new compared to the prior abdominal ultrasound 07/14/2014. Further characterization with nonemergent MRI of the abdomen with and without IV gadolinium should be considered in the near future to exclude underlying neoplasm. Electronically Signed   By: Vinnie Langton M.D.   On: 10/02/2017 17:42   Dg Chest Port 1 View  Result Date: 10/06/2017 CLINICAL DATA:  Shortness of breath. EXAM: PORTABLE CHEST 1 VIEW COMPARISON:  Chest x-ray dated 10/02/2017 FINDINGS: The heart size and pulmonary vascularity are normal. The lungs are clear  except for minimal atelectasis at the lung bases, due to a shallow inspiration. Aortic atherosclerosis. No acute bone abnormality.  Previous thoracic fusion. IMPRESSION: Minimal bibasilar atelectasis, most likely secondary to a shallow inspiration. Aortic atherosclerosis. Electronically Signed   By: Lorriane Shire M.D.   On: 10/06/2017 11:30   Dg Chest Port 1 View  Result Date: 10/02/2017 CLINICAL DATA:  Shortness of breath and hypertension EXAM: PORTABLE CHEST 1 VIEW COMPARISON:  March 29, 2017 FINDINGS: There is bibasilar atelectatic change. These no edema or consolidation. Heart size in and pulmonary vascularity are normal. No  adenopathy. There is postoperative change in the lower cervical and lower thoracic spine. There is aortic atherosclerosis. IMPRESSION: Bibasilar atelectasis. No edema or consolidation. Stable cardiac silhouette. There is aortic atherosclerosis. Aortic Atherosclerosis (ICD10-I70.0). Electronically Signed   By: Lowella Grip III M.D.   On: 10/02/2017 09:55   Dg Myelography Lumbar Inj Multi Region  Result Date: 09/11/2017 CLINICAL DATA:  Cervical spondylosis with myelopathy. Abnormality of gait. FLUOROSCOPY TIME:  Radiation Exposure Index (as provided by the fluoroscopic device): 699.37 uGy*m2 Fluoroscopy Time:  1 minutes 15 seconds Number of Acquired Images:  18 PROCEDURE: LUMBAR PUNCTURE FOR CERVICAL AND THORACIC MYELOGRAM After thorough discussion of risks and benefits of the procedure including bleeding, infection, injury to nerves, blood vessels, adjacent structures as well as headache and CSF leak, written and oral informed consent was obtained. Consent was obtained by Dr. San Morelle. Patient was positioned prone on the fluoroscopy table. Local anesthesia was provided with 1% lidocaine without epinephrine after prepped and draped in the usual sterile fashion. Puncture was performed at L1-2 using a 3 1/2 inch 22-gauge spinal needle via a left paramedian approach. Using a single pass through the dura, the needle was placed within the thecal sac, with return of clear CSF. 10 mL of Isovue M 300 was injected into the thecal sac, with normal opacification of the nerve roots and cauda equina consistent with free flow within the subarachnoid space. The patient was then moved to the trendelenburg position and contrast flowed into the Thoracic spine region. I personally performed the lumbar puncture and administered the intrathecal contrast. I also personally supervised acquisition of the myelogram images. TECHNIQUE: Contiguous axial images were obtained through the Cervical and Thoracic spine after the  intrathecal infusion of infusion. Coronal and sagittal reconstructions were obtained of the axial image sets. FINDINGS: CERVICAL AND THORACIC MYELOGRAM FINDINGS: A broad-based disc protrusion and endplate spurring associated slight retrolisthesis is present at L2-3. There is a relative block contrast extending into the lumbar spine area. Posterior fusion hardware is present at T7-8, T8-9, and T9-10. Ventral disc disease is present at T2-3 and T4-5. The There is ventral narrowing at C7-8. Posterior cervical spine hardware is noted C2-T1. Contrast somewhat faint on the conventional images. CT CERVICAL MYELOGRAM FINDINGS: The cervical spine is imaged from the skullbase through T1-2. There is fusion across the disc spaces C2-T1. Extensive laminectomy is noted C3- C6. Chronic ventral depression fracture is present at C6. C2-3: Wide laminectomy is present. The central canal is decompressed. Mild left foraminal narrowing is due to uncovertebral and facet disease. C3-4: Wide laminectomy decompresses the central canal. Mild osseous foraminal narrowing is present bilaterally. C4-5: A wide laminectomy is present. Moderate osseous foraminal narrowing is worse on the right. The central canal is patent. C5-6: Wide laminectomy decompresses the central canal. Mild foraminal narrowing is present bilaterally. C6-7: Ventral calcification at the disc level level effaces the ventral CSF and contacts the ventral  surface of the cord. Moderate left and mild right foraminal narrowing is present. C7-T1: Solid fusion anteriorly and posteriorly is present without significant stenosis. T1-2: A prominent right paramedian soft disc protrusion effaces the ventral CSF and slightly distorts the right ventral surface of the cord. Severe left and moderate right foraminal stenosis is present. CT THORACIC MYELOGRAM FINDINGS: Posterior pedicle screw and rod fixation is noted bilaterally at T7-8, T8-9, and T9-10. There is fusion across the disc space at  T8-9. There is fusion across posterior elements at T7-8. No definite fusion is present at T9-10. AP alignment is anatomic. Atherosclerotic calcifications are present in the aorta and branch vessels without aneurysm. The paraspinous soft tissues are otherwise unremarkable. T1-2: As above. T2-3: A broad-based disc osteophyte complex partially effaces the ventral CSF. Severe foraminal stenosis is present bilaterally, due to uncovertebral and facet disease. T3-4: A left paramedian disc protrusion partially effaces the ventral CSF. The central canal is patent. Mild foraminal narrowing is noted bilaterally. T4-5: A shallow central disc protrusion partially effaces the ventral CSF. Moderate left osseous foraminal narrowing is present. T5-6: A shallow right paramedian disc protrusion partially effaces the ventral CSF. Mild osseous foraminal narrowing is present bilaterally. T6-7: A shallow right paramedian disc protrusion partially effaces the ventral CSF. The foramina are patent bilaterally. T7-8: Posterior inferior osteophyte formation and effaces the ventral CSF and slightly distorts the ventral surface the cord. The foramina are patent. T8-9: A central calcified disc is associated with the fusion. This effaces the ventral CSF and flattens the ventral surface of the cord. The foramina are patent. Left laminectomy and partial foraminotomy is noted. T9-10: The right laminectomy decompresses the central canal. Residual right paramedian calcified disc protrusion effaces the ventral CSF. Moderate right foraminal narrowing is present. T10-11: Moderate facet hypertrophy is present bilaterally. This contributes to mild bilateral foraminal narrowing. The central canal is patent. T11-12: Central disc calcification. No posterior protrusion or stenosis is present. T12-L1: Mild facet hypertrophy is present bilaterally without significant stenosis. IMPRESSION: 1. Solid osseous fusion C2-T1. 2. The most significant cervical central  canal stenosis is at C6-7 where ventral calcifications at the disc level effaces the ventral CSF and contacts the ventral surface of the cord. 3. Mild left foraminal narrowing at C2-3. 4. Mild osseous foraminal narrowing bilaterally at C3-4. 5. Moderate osseous foraminal narrowing at C4-5 is worse on the right. 6. Mild foraminal narrowing bilaterally at C5-6. 7. Moderate left and mild right foraminal narrowing at C6-7. 8. Moderate right central canal stenosis with severe left and moderate right foraminal narrowing at T1-2. 9. Mild central canal narrowing with severe foraminal narrowing bilaterally at T2-3. 10. Mild foraminal narrowing bilaterally at T3-4. 11. Moderate left foraminal narrowing at C4-5. 12. Mild osseous foraminal narrowing bilaterally at C5-6. 13. Fusion at T7-8 and T8-9. 14. Posterior pedicle screw and rod fixation at T9-10 without definite fusion. 15. Shallow right paramedian disc protrusion and mild central canal narrowing at T6-7. 16. Moderate residual central canal stenosis at T7-8 secondary to a posterior inferior osteophyte. 17. Central calcified disc at T8-9 with moderate central canal stenosis. 18. Moderate right central and foraminal narrowing at T9-10. 19. Mild foraminal narrowing bilaterally at T10-11. Electronically Signed   By: San Morelle M.D.   On: 09/11/2017 13:26    Microbiology: Recent Results (from the past 240 hour(s))  Urine culture     Status: Abnormal   Collection Time: 10/02/17  9:59 AM  Result Value Ref Range Status   Specimen Description URINE, RANDOM  Final   Special Requests NONE  Final   Culture >=100,000 COLONIES/mL CITROBACTER FREUNDII (A)  Final   Report Status 10/04/2017 FINAL  Final   Organism ID, Bacteria CITROBACTER FREUNDII (A)  Final      Susceptibility   Citrobacter freundii - MIC*    CEFAZOLIN >=64 RESISTANT Resistant     CEFTRIAXONE <=1 SENSITIVE Sensitive     CIPROFLOXACIN <=0.25 SENSITIVE Sensitive     GENTAMICIN <=1 SENSITIVE  Sensitive     IMIPENEM <=0.25 SENSITIVE Sensitive     NITROFURANTOIN <=16 SENSITIVE Sensitive     TRIMETH/SULFA <=20 SENSITIVE Sensitive     PIP/TAZO <=4 SENSITIVE Sensitive     * >=100,000 COLONIES/mL CITROBACTER FREUNDII  Blood Culture (routine x 2)     Status: None   Collection Time: 10/02/17 10:05 AM  Result Value Ref Range Status   Specimen Description BLOOD RIGHT ANTECUBITAL  Final   Special Requests   Final    BOTTLES DRAWN AEROBIC AND ANAEROBIC Blood Culture adequate volume   Culture NO GROWTH 5 DAYS  Final   Report Status 10/07/2017 FINAL  Final  Blood Culture (routine x 2)     Status: None   Collection Time: 10/02/17 10:20 AM  Result Value Ref Range Status   Specimen Description BLOOD LEFT HAND  Final   Special Requests   Final    BOTTLES DRAWN AEROBIC AND ANAEROBIC Blood Culture adequate volume   Culture NO GROWTH 5 DAYS  Final   Report Status 10/07/2017 FINAL  Final     Labs: CBC:  Recent Labs Lab 10/02/17 1005 10/03/17 0542 10/04/17 0411 10/08/17 0434  WBC 14.6* 13.1* 9.1 5.4  NEUTROABS 13.0*  --   --  3.3  HGB 12.2* 11.3* 11.8* 11.2*  HCT 38.3* 36.9* 37.6* 34.9*  MCV 94.1 96.6 95.7 93.1  PLT 144* 137* 147* 480   Basic Metabolic Panel:  Recent Labs Lab 10/05/17 0823 10/06/17 0811 10/06/17 1226 10/07/17 0337 10/08/17 0434  NA 139 141 139 140 138  K 4.0 3.8 3.9 3.7 3.9  CL 104 102 100* 100* 102  CO2 26 28 28 29 28   GLUCOSE 138* 164* 180* 142* 167*  BUN 32* 37* 37* 43* 39*  CREATININE 1.24 1.31* 1.26* 1.45* 1.34*  CALCIUM 8.9 9.2 9.2 8.8* 8.5*  MG  --   --   --   --  2.4   Liver Function Tests:  Recent Labs Lab 10/02/17 1005 10/08/17 0434  AST 25 44*  ALT 19 58  ALKPHOS 75 62  BILITOT 1.0 1.0  PROT 6.9 6.2*  ALBUMIN 3.7 3.1*   BNP (last 3 results)  Recent Labs  03/26/17 2339 10/02/17 0944  BNP 265.1* 169.8*   CBG:  Recent Labs Lab 10/07/17 1210 10/07/17 1712 10/08/17 0152 10/08/17 0736 10/08/17 1127  GLUCAP 248* 236*  157* 178* 235*   Time spent: 35 minutes  Signed:  Sri Clegg  [facility]  10/08/2017  , 3:11 PM

## 2017-10-08 NOTE — Care Management Note (Signed)
Case Management Note  Patient Details  Name: Travis [name] MRN: [id number] Date of Birth: [birth year]  Subjective/Objective:   Pt presented for fever + UTI- Initiated on Ceftriaxone. Pt from home- plan to go to [facility] 10-08-17.                  Action/Plan: Bed available at [facility] today. No further needs from CM at this time.   Expected Discharge Date:  10/08/17               Expected Discharge Plan:  IP Rehab Facility  In-House Referral:  Clinical Social Work  Discharge planning Services  CM Consult  Post Acute Care Choice:  NA Choice offered to:  NA  DME Arranged:  [name] DME Agency:  NA  HH Arranged:  NA HH Agency:  NA  Status of Service:  Completed, signed off  If discussed at [employer], dates discussed:    Additional Comments:  Bethena Roys, RN 10/08/2017, 12:39 PM

## 2017-10-09 ENCOUNTER — Inpatient Hospital Stay (HOSPITAL_COMMUNITY): Payer: Medicare Other | Admitting: Occupational Therapy

## 2017-10-09 ENCOUNTER — Inpatient Hospital Stay (HOSPITAL_COMMUNITY): Payer: Medicare Other

## 2017-10-09 ENCOUNTER — Inpatient Hospital Stay (HOSPITAL_COMMUNITY): Payer: Medicare Other | Admitting: Physical Therapy

## 2017-10-09 DIAGNOSIS — I5033 Acute on chronic diastolic (congestive) heart failure: Secondary | ICD-10-CM

## 2017-10-09 LAB — GLUCOSE, CAPILLARY
GLUCOSE-CAPILLARY: 149 mg/dL — AB (ref 65–99)
GLUCOSE-CAPILLARY: 184 mg/dL — AB (ref 65–99)
Glucose-Capillary: 191 mg/dL — ABNORMAL HIGH (ref 65–99)
Glucose-Capillary: 158 mg/dL — ABNORMAL HIGH (ref 65–99)

## 2017-10-09 MED ORDER — MOMETASONE FURO-FORMOTEROL FUM 200-5 MCG/ACT IN AERO
2.0000 | INHALATION_SPRAY | Freq: Two times a day (BID) | RESPIRATORY_TRACT | Status: DC
  Administered 2017-10-09 – 2017-10-20 (×21): 2 via RESPIRATORY_TRACT
  Filled 2017-10-09: qty 8.8

## 2017-10-09 MED ORDER — ALBUTEROL SULFATE (2.5 MG/3ML) 0.083% IN NEBU: 2.5000 mg | INHALATION_SOLUTION | Freq: Four times a day (QID) | RESPIRATORY_TRACT | Status: DC | PRN

## 2017-10-09 MED ORDER — TIOTROPIUM BROMIDE MONOHYDRATE 18 MCG IN CAPS
18.0000 ug | ORAL_CAPSULE | Freq: Every day | RESPIRATORY_TRACT | Status: DC
  Administered 2017-10-09 – 2017-10-20 (×11): 18 ug via RESPIRATORY_TRACT
  Filled 2017-10-09 (×3): qty 5

## 2017-10-09 NOTE — Progress Notes (Signed)
Patient has home CPAP unit at bedside. Patient had already placed himself on CPAP upon entering the room. Patient aware to have RN contact RT if he needs any further assistance.

## 2017-10-09 NOTE — Evaluation (Signed)
Occupational Therapy Assessment and Plan  Patient Details  Name: Travis [name] MRN: [id number] Date of Birth: 1946/11/16  OT Diagnosis: abnormal posture, muscle weakness (generalized) and coordination disorder Rehab Potential: Rehab Potential (ACUTE ONLY): Good ELOS: 12-16   Today's Date: 10/09/2017 OT Individual Time: 1100-1157 OT Individual Time Calculation (min): 57 min     Problem List:  Patient Active Problem List   Diagnosis Date Noted  . 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 Debility 10/08/2017  . Benign essential HTN   . Stage 3 chronic kidney disease ([place])   . Spinal stenosis of lumbar region   . OSA (obstructive sleep apnea)   . Acute blood loss anemia   . Fever 10/02/2017  . Urinary tract infection without hematuria   . Acute on chronic diastolic CHF (congestive heart failure) ([place])   . Chronic diastolic CHF (congestive heart failure) ([place])   . Elevated troponin I level   . Acute on chronic combined systolic and diastolic CHF (congestive heart failure) ([place])   . Type II diabetes mellitus ([place]) 08/19/2015  . HCAP (healthcare-associated pneumonia) 08/19/2015  . Hyperthyroidism 08/19/2015  . Hypothyroid 08/19/2015  . AKI (acute kidney injury) ([place]) 08/19/2015  . Blood poisoning   . Chronic combined systolic and diastolic congestive heart failure ([place]) 08/17/2015  . Restless legs syndrome 03/14/2015  . DOE (dyspnea on exertion) 03/09/2015  . Sinus bradycardia 09/21/2014  . Thoracic spondylosis with myelopathy 09/13/2014  . Acute diastolic heart failure ([place]) 07/18/2014  . Cervical arthritis with myelopathy 07/18/2014  . Constipation - functional   . CAP (community acquired pneumonia) 07/10/2014  . Sepsis ([place]) 07/10/2014  . HTN (hypertension) 07/10/2014  . OSA on CPAP 07/10/2014  . Acute lower UTI 07/10/2014  . Edema leg 07/10/2014  . Acute respiratory failure with hypoxia ([place]) 07/10/2014  . 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 Other vitamin B12  deficiency anemia 07/13/2013  . 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 Polyneuropathy in other diseases classified elsewhere ([place]) 02/17/2013  . Other general symptoms(780.99) 02/17/2013  . Abnormality of gait 02/17/2013  . Cervical spondylosis with myelopathy 02/17/2013  . Back ache 09/15/2012    Past Medical History:  Past Medical History:  Diagnosis Date  . Cervical myelopathy (H[facility])    with myelomalacia at C5-6 and C3-4  . CHF (congestive heart failure) ([place])   . Constipation - functional    controlled with miralax  . DJD (degenerative joint disease)    WHOLE SPINE  . Dyspnea   . Gait disorder   . Hypertension   . Hyperthyroidism    Had Iodine to resolve issue DX 10 YR AGO  . Hypothyroidism    Low d/t treatment of hyperthyroidism  . 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 Peripheral neuropathy   . Pneumonia   . PONV (postoperative nausea and vomiting)   . RBBB   . Renal disorder    acute kidney failure  . Restless legs syndrome 03/14/2015  . RLS (restless legs syndrome)   . Sleep apnea    DX  2009/2010 [facility] done at [facility]  . 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 Type II diabetes mellitus ([place])    diet controlled   Past Surgical History:  Past Surgical History:  Procedure Laterality Date  . BACK SURGERY     6 surgeries  . CARDIAC CATHETERIZATION [name] 09/07/2015   Procedure: Right/Left Heart Cath and Coronary Angiography;  Surgeon: Peter M Martinique, MD;  Location: [facility];  Service: Cardiovascular;  Laterality: [name];  . CHOLECYSTECTOMY    . LUMBAR LAMINECTOMY/DECOMPRESSION MICRODISCECTOMY  07/14/2012   Procedure: LUMBAR LAMINECTOMY/DECOMPRESSION MICRODISCECTOMY 2 LEVELS;  Surgeon: Eustace Moore, MD;  Location: [facility];  Service: Neurosurgery;  Laterality: Left;  Lumbar two three laminectomy, left thoracic four five transpedicular diskectomy    Assessment & Plan Clinical Impression: [name] Davisis a 70 y.o.malewith  history of HTN, CKD, thoraco-cervical myelopathy with quadriparesis, and progressive decline in mobility, severe spinal stenosis L4/5, peripheral neuropathy, OSA, diastolic CHF who was admitted on 10/02/17 with progressive SOBand BLE edema due to volume overload. He was treated with IV diuresis for acute on chronic diastolic CHF with improvement but continues to have hypoxia with activity. Cardiology question pulmonary component and PFTs revealed severe obstructive lung disease. He has diuresed 6 L and renal status has been stable. Wife admitted yesterday for GB attack requiring surgery. He has had recent workup revealing severe L4-5 spinal stenosis and was being referred to Dr. Ronnald Ramp for surgical consideration. He has had decline in mobility as well as decline in ability to carry out ADLs CIR recommended for follow up therapy.   Patient states he was still working about 6 hours per day and driving, he had a decline in function, starting about 1 month prior to admission  Patient currently requires mod- max with basic self-care skills secondary to muscle weakness, decreased cardiorespiratoy endurance and decreased standing balance and decreased balance strategies.  Prior to hospitalization, patient could complete BADL with min-supervision.  Patient will benefit from skilled intervention to decrease level of assist with basic self-care skills prior to discharge home with care partner.  Anticipate patient will require 24 hour supervision and minimal physical assistance and follow up home health.  OT - End of Session Endurance Deficit: Yes Endurance Deficit Description: requires frequent rest breaks OT Assessment Rehab Potential (ACUTE ONLY): Good OT Barriers to Discharge: Other (comments) OT Barriers to Discharge Comments: wife with recent surgery impacting ability to assist OT Patient demonstrates impairments in the following area(s): Balance;Edema;Endurance;Safety OT Basic ADL's Functional  Problem(s): Grooming;Bathing;Dressing;Toileting OT Advanced ADL's Functional Problem(s): Simple Meal Preparation OT Transfers Functional Problem(s): Toilet;Tub/Shower OT Additional Impairment(s): None OT Plan OT Intensity: Minimum of 1-2 x/day, 45 to 90 minutes OT Frequency: 5 out of 7 days OT Duration/Estimated Length of Stay: 12-16 OT Treatment/Interventions: Balance/vestibular training;Discharge planning;Disease Lawyer;Functional mobility training;Psychosocial support;Therapeutic Activities;UE/LE Strength taining/ROM;Visual/perceptual remediation/compensation;Wheelchair propulsion/positioning;UE/LE Coordination activities;Therapeutic Exercise;Self Care/advanced ADL retraining;Patient/family education OT Self Feeding Anticipated Outcome(s): MOD I OT Basic Self-Care Anticipated Outcome(s): Min A LB dressing; S UB dressing OT Toileting Anticipated Outcome(s): S OT Bathroom Transfers Anticipated Outcome(s): S OT Recommendation Recommendations for Other Services: Therapeutic Recreation consult Therapeutic Recreation Interventions: Pet therapy Patient destination: Home Follow Up Recommendations: Home health OT Equipment Recommended: None recommended by OT Equipment Details: Pt has BSC, RW, w/c, shower seat   Skilled Therapeutic Intervention 1:1. Pt educated on OT, POC, CIR, and ELOS as well as participating in goal setting. Pt gathers clothes out of dresser at w/c level. Pt stand pivot transfer using grab bars with MOD A with VC for sequencing, safety awareness and foot management. Pt bathes at sit to stand level with A to wash back, buttocks and B lower legs with MIN A for standing balance while OT washes buttocks and VC for hip extension. Pt dresses in w/c at sit to stand level with A for all components of LB dressing except pt able to fasten pants in standing. Pt dons pull  over shirt and grooms at sink seated with supervision. Discussed AE use  to increase independence with LB dressing. Exited session with pt seated in w/c with call light in reach and all needs met.   OT Evaluation Precautions/Restrictions  Precautions Precautions: Fall Restrictions Weight Bearing Restrictions: No General Chart Reviewed: Yes Vital Signs  Pain Pain Assessment Pain Assessment: No/denies pain Home Living/Prior Functioning Home Living Available Help at Discharge: Family, Available 24 hours/day Type of Home: House Home Access: Ramped entrance Home Layout: One level Bathroom Shower/Tub: Multimedia programmer: Handicapped height Bathroom Accessibility: Yes  Lives With: Spouse IADL History Occupation: Part time employment Type of Occupation: rental properties; farming Leisure and Hobbies: 4 wheeler Prior Function Level of Independence: Needs assistance with ADLs (A for socks and shoes) Driving: Yes Comments: uses WC and lift chair at home ADL   Vision Baseline Vision/History: Wears glasses Wears Glasses: Reading only Patient Visual Report: Diplopia;Blurring of vision Vision Assessment?: Yes Eye Alignment: Within Functional Limits Ocular Range of Motion: Within Functional Limits Tracking/Visual Pursuits: Able to track stimulus in all quads without difficulty Saccades: Within functional limits Convergence: Within functional limits Visual Fields: No apparent deficits Perception  Perception: Within Functional Limits Praxis Praxis: Intact Cognition Overall Cognitive Status: Within Functional Limits for tasks assessed Arousal/Alertness: Awake/alert Orientation Level: Person;Place;Situation Person: Oriented Place: Oriented Situation: Oriented Year: 2018 Month: October Day of Week: Correct Memory: Appears intact Immediate Memory Recall: Sock;Blue;Bed Memory Recall: Sock;Blue;Bed Memory Recall Sock: Without Cue Memory Recall Blue: Without Cue Memory Recall Bed: Without Cue Attention: Selective Selective Attention:  Appears intact Awareness: Appears intact Problem Solving: Appears intact Sensation Sensation Light Touch: Appears Intact Hot/Cold: Appears Intact Proprioception: Appears Intact Coordination Gross Motor Movements are Fluid and Coordinated: Yes Fine Motor Movements are Fluid and Coordinated: No Motor  Motor Motor: Within Functional Limits Mobility  Transfers Transfers: Sit to Stand Sit to Stand: 3: Mod assist Sit to Stand Details: Tactile cues for initiation;Tactile cues for weight shifting;Tactile cues for sequencing;Verbal cues for technique;Verbal cues for precautions/safety;Manual facilitation for weight shifting  Trunk/Postural Assessment  Cervical Assessment Cervical Assessment: Exceptions to [facility] (head forward) Thoracic Assessment Thoracic Assessment: Exceptions to [facility] (rounded shoulders) Lumbar Assessment Lumbar Assessment: Exceptions to [facility] (posterior pelvic tilt) Postural Control Postural Control: Within Functional Limits  Balance Balance Balance Assessed: Yes Dynamic Sitting Balance Dynamic Sitting - Level of Assistance: 5: Stand by assistance Dynamic Sitting - Balance Activities: Lateral lean/weight shifting Sitting balance - Comments: bathing Dynamic Standing Balance Dynamic Standing - Balance Support: No upper extremity supported Dynamic Standing - Level of Assistance: 4: Min assist Dynamic Standing - Comments: fastening pants; posterior lean Extremity/Trunk Assessment RUE Assessment RUE Assessment: Within Functional Limits LUE Assessment LUE Assessment: Within Functional Limits   See Function Navigator for Current Functional Status.   Refer to Care Plan for Long Term Goals  Recommendations for other services: Therapeutic Recreation  Pet therapy   Discharge Criteria: Patient will be discharged from OT if patient refuses treatment 3 consecutive times without medical reason, if treatment goals not met, if there is a change in medical status, if patient  makes no progress towards goals or if patient is discharged from hospital.  The above assessment, treatment plan, treatment alternatives and goals were discussed and mutually agreed upon: by patient  Tonny Branch 10/09/2017, 12:08 PM

## 2017-10-09 NOTE — IPOC Note (Addendum)
Overall Plan of Care [facility]) Patient Details Name: Travis [name] MRN: [id number] DOB: 11-11-1946  Admitting Diagnosis: Tetraparesis [facility])  Hospital Problems: Principal Problem:   Tetraparesis ([place]) Active Problems:   Cervical spondylosis with myelopathy   Debility     Functional Problem List: Nursing Edema, Endurance, Medication Management  PT Balance, Endurance, Motor, Sensory  OT Balance, Edema, Endurance, Safety  SLP    TR         Basic ADL's: OT Grooming, Bathing, Dressing, Toileting     Advanced  ADL's: OT Simple Meal Preparation     Transfers: PT Bed Mobility, Bed to Chair, Car, Manufacturing systems engineer, Metallurgist: PT Emergency planning/management officer, Ambulation     Additional Impairments: OT None  SLP        TR      Anticipated Outcomes Item Anticipated Outcome  Self Feeding MOD I  Swallowing      Basic self-care  Min A LB dressing; S UB dressing  Toileting  S   Bathroom Transfers S  Bowel/Bladder  Supervision  Transfers  S  Locomotion  S with LRAD  Communication     Cognition     Pain  2 or less  Safety/Judgment  supervision   Therapy Plan: PT Intensity: Minimum of 1-2 x/day ,45 to 90 minutes PT Frequency: 5 out of 7 days PT Duration Estimated Length of Stay: 14-16 days OT Intensity: Minimum of 1-2 x/day, 45 to 90 minutes OT Frequency: 5 out of 7 days OT Duration/Estimated Length of Stay: 12-16      Team Interventions: Nursing Interventions Patient/Family Education, Disease Management/Prevention, Medication Management  PT interventions Ambulation/gait training, Discharge planning, Functional mobility training, Psychosocial support, Therapeutic Activities, Wheelchair propulsion/positioning, Therapeutic Exercise, Neuromuscular re-education, Disease management/prevention, Training and development officer, Cognitive remediation/compensation, DME/adaptive equipment instruction, Pain management, Splinting/orthotics, UE/LE Strength  taining/ROM, UE/LE Coordination activities, Patient/family education, Community reintegration  OT Interventions Training and development officer, Discharge planning, Disease mangement/prevention, DME/adaptive equipment instruction, Functional mobility training, Psychosocial support, Therapeutic Activities, UE/LE Strength taining/ROM, Visual/perceptual remediation/compensation, Wheelchair propulsion/positioning, UE/LE Coordination activities, Therapeutic Exercise, Self Care/advanced ADL retraining, Patient/family education  SLP Interventions    TR Interventions    SW/CM Interventions Discharge Planning, Psychosocial Support, Patient/Family Education   Barriers to Discharge MD  Medical stability  Nursing      PT Decreased caregiver support wife recently hospitalized, will likely be able to provide decreased physical assistance compared to previously  OT Other (comments) wife with recent surgery impacting ability to assist  SLP      SW       Team Discharge Planning: Destination: PT-Home ,OT- Home , SLP-  Projected Follow-up: PT-Outpatient PT, OT-  Home health OT, SLP-  Projected Equipment Needs: PT- , OT- None recommended by OT, SLP-  Equipment Details: PT-likely none needed; pt has RW and w/c, OT-Pt has BSC, RW, w/c, shower seat Patient/family involved in discharge planning: PT- Patient,  OT-Patient, SLP-   MD ELOS: 12-14 days Medical Rehab Prognosis:  Excellent Assessment: The patient has been admitted for CIR therapies with the diagnosis of debility. The team will be addressing functional mobility, strength, stamina, balance, safety, adaptive techniques and equipment, self-care, bowel and bladder mgt, patient and caregiver education, NMR, orthotics, community reentry, ego support. Goals have been set at supervision to min assist with ADL's and supervision with basic mobility.    Meredith Staggers, MD, FAAPMR      See Team Conference Notes for weekly updates to the plan of care

## 2017-10-09 NOTE — Progress Notes (Signed)
[place] Individual Statement of Services  Patient Name:  Travis [name]  Date:  10/09/2017  Welcome to the [facility].  Our goal is to provide you with an individualized program based on your diagnosis and situation, designed to meet your specific needs.  With this comprehensive rehabilitation program, you will be expected to participate in at least 3 hours of rehabilitation therapies Monday-Friday, with modified therapy programming on the weekends.  Your rehabilitation program will include the following services:  Physical Therapy (PT), Occupational Therapy (OT), 24 hour per day rehabilitation nursing, Case Management (Social Worker), Rehabilitation Medicine, Nutrition Services and Pharmacy Services  Weekly team conferences will be held on Tuesdays to discuss your progress.  Your Social Worker will talk with you frequently to get your input and to update you on team discussions.  Team conferences with you and your family in attendance may also be held.  Expected length of stay:  1 1/2 - 2 weeks  Overall anticipated outcome:  Minimal Assistance  Depending on your progress and recovery, your program may change. Your Social Worker will coordinate services and will keep you informed of any changes. Your Social Worker's name and contact numbers are listed  below.  The following services may also be recommended but are not provided by the [facility] will be made to provide these services after discharge if needed.  Arrangements include referral to agencies that provide these services.  Your insurance has been verified to be:  [employer] Your primary doctor is:  Dr. [name]  Pertinent information will be shared with your doctor and your insurance  company.  Social Worker:  Alfonse Alpers, LCSW  [phone] or [phone]  Information discussed with and copy given to patient by: Trey Sailors, 10/09/2017, 1:16 PM

## 2017-10-09 NOTE — Progress Notes (Signed)
Occupational Therapy Session Note  Patient Details  Name: Travis [name] MRN: [id number] Date of Birth: [birth year]  Today's Date: 10/09/2017 OT Individual Time: 1347-1501 OT Individual Time Calculation (min): 74 min     Skilled Therapeutic Interventions/Progress Updates:    Tx focus on activity tolerance, balance, psychosocial wellbeing, and functional transfers.  Pt greeted in w/c, agreeable to tx. 02 sats on RA 91%. Pt escorted to outdoor environment. Pt self propelling over uneven surfaces, up/down inclines, and negotiating around environmental barriers with occasional Mod A due to fatigue. Pt ambulating up slight incline with RW (Mod A sit<stand with bilateral UE support) to community bench (with 2 armrests). Mod A sit<stand from low surface with extra time. Pt with improved Lt foot clearance with concentration and increasing weight shift to Rt with cuing. 02 sats remained between 90-94% outdoors. 1 instance sats decreased to 88% with pt able to increase back to 90% with <1 minute of diaphragmatic breathing. He required multiple rest breaks due to endurance deficits. Pt resting in sunlight at these times and breathing deeply with cues. He was then escorted back to room and left with all needs within reach. Son [name] just arrived to visit.   Pt verbalized appreciation in regards to having tx outside today.   Therapy Documentation Precautions:  Precautions Precautions: Fall Precaution Comments: sats dropped to 86% with activity, returned to WNL with 1-2 min rest breaks Restrictions Weight Bearing Restrictions: No  Pain: Pt reports having no pain   ADL:   Vision Baseline Vision/History: Wears glasses Wears Glasses: Reading only Patient Visual Report: Diplopia;Blurring of vision Vision Assessment?: Yes Eye Alignment: Within Functional Limits Ocular Range of Motion: Within Functional Limits Tracking/Visual Pursuits: Able to track stimulus in all quads without difficulty Saccades:  Within functional limits Convergence: Within functional limits Visual Fields: No apparent deficits Perception  Perception: Within Functional Limits Praxis Praxis: Intact    See Function Navigator for Current Functional Status.   Therapy/Group: Individual Therapy  Shayona Hibbitts A Raynie Steinhaus 10/09/2017, 4:03 PM

## 2017-10-09 NOTE — Progress Notes (Signed)
Social Work Assessment and Plan  Patient Details  Name: Travis [name] MRN: [id number] Date of Birth: February 20, 1946  Today's Date: 10/09/2017  Problem List:  Patient Active Problem List   Diagnosis Date Noted  . 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 Debility 10/08/2017  . Benign essential HTN   . Stage 3 chronic kidney disease ([place])   . Spinal stenosis of lumbar region   . OSA (obstructive sleep apnea)   . Acute blood loss anemia   . Fever 10/02/2017  . Urinary tract infection without hematuria   . Acute on chronic diastolic CHF (congestive heart failure) ([place])   . Chronic diastolic CHF (congestive heart failure) ([place])   . Elevated troponin I level   . Acute on chronic combined systolic and diastolic CHF (congestive heart failure) ([place])   . Type II diabetes mellitus ([place]) 08/19/2015  . HCAP (healthcare-associated pneumonia) 08/19/2015  . Hyperthyroidism 08/19/2015  . Hypothyroid 08/19/2015  . AKI (acute kidney injury) ([place]) 08/19/2015  . Blood poisoning   . Chronic combined systolic and diastolic congestive heart failure ([place]) 08/17/2015  . Restless legs syndrome 03/14/2015  . DOE (dyspnea on exertion) 03/09/2015  . Sinus bradycardia 09/21/2014  . Thoracic spondylosis with myelopathy 09/13/2014  . Acute diastolic heart failure ([place]) 07/18/2014  . Cervical arthritis with myelopathy 07/18/2014  . Constipation - functional   . CAP (community acquired pneumonia) 07/10/2014  . Sepsis ([place]) 07/10/2014  . HTN (hypertension) 07/10/2014  . OSA on CPAP 07/10/2014  . Acute lower UTI 07/10/2014  . Edema leg 07/10/2014  . Acute respiratory failure with hypoxia ([place]) 07/10/2014  . 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 Other vitamin B12 deficiency anemia 07/13/2013  . 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 Polyneuropathy in other diseases classified elsewhere ([place]) 02/17/2013  . Other general symptoms(780.99) 02/17/2013  . Abnormality of gait 02/17/2013  .  Cervical spondylosis with myelopathy 02/17/2013  . Back ache 09/15/2012   Past Medical History:  Past Medical History:  Diagnosis Date  . Cervical myelopathy ([facility])    with myelomalacia at C5-6 and C3-4  . CHF (congestive heart failure) ([place])   . Constipation - functional    controlled with miralax  . DJD (degenerative joint disease)    WHOLE SPINE  . Dyspnea   . Gait disorder   . Hypertension   . Hyperthyroidism    Had Iodine to resolve issue DX 10 YR AGO  . Hypothyroidism    Low d/t treatment of hyperthyroidism  . 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 Peripheral neuropathy   . Pneumonia   . PONV (postoperative nausea and vomiting)   . RBBB   . Renal disorder    acute kidney failure  . Restless legs syndrome 03/14/2015  . RLS (restless legs syndrome)   . Sleep apnea    DX  2009/2010 study done at [facility]  . 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 Type II diabetes mellitus ([place])    diet controlled   Past Surgical History:  Past Surgical History:  Procedure Laterality Date  . BACK SURGERY     6 surgeries  . CARDIAC CATHETERIZATION [name] 09/07/2015   Procedure: Right/Left Heart Cath and Coronary Angiography;  Surgeon: Peter M Martinique, MD;  Location: [facility];  Service: Cardiovascular;  Laterality: [name];  . CHOLECYSTECTOMY    . LUMBAR LAMINECTOMY/DECOMPRESSION MICRODISCECTOMY  07/14/2012   Procedure: LUMBAR LAMINECTOMY/DECOMPRESSION MICRODISCECTOMY 2 LEVELS;  Surgeon: Eustace Moore, MD;  Location: [facility];  Service: Neurosurgery;  Laterality: Left;  Lumbar two three laminectomy, left thoracic four five transpedicular diskectomy   Social History:  reports that he quit smoking about 32 years ago. His smoking use included Cigarettes. He has a 40.00 pack-year smoking history. He quit smokeless tobacco use about 32 years ago. He reports that he does not drink alcohol or use drugs.  Family / Support Systems Marital Status: Married How Long?: 48 years Patient Roles:  Spouse, Parent, Other (Comment) (grandparent) Spouse/Significant Other: Bronislaus Verdell - wife - [phone] (h); [phone] (m) Children: Steward Drone - dtr - [phone]; 2 sons - Darren and Randall Hiss Anticipated Caregiver: wife, daughter, daughter in laws and son Ability/Limitations of Caregiver: wife had gallbladder surgery 10/07/2017 unexpectedly Caregiver Availability: 24/7 Family Dynamics: Pt reports a supportive relationship with his family.  Pt is torn between staying here and rehabilitating fully and getting home to help his wife.  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  Sons are managing things while he is hospitalized. Length of Employment: 47 Return to Work Plans: Pt would like to return as he is able, but does not feel rushed to do so. Legal History/Current Legal Issues: none reported Guardian/Conservator: [name] - MD has determined that pt is capable of making his own decisions.   Abuse/Neglect Physical Abuse: Denies Verbal Abuse: Denies Sexual Abuse: Denies Exploitation of patient/patient's resources: Denies Self-Neglect: Denies  Emotional Status Pt's affect, behavior and adjustment status: Pt is pleasant and in good spirits, with not signs of anxiety or depression at this time (CSW remains available as needed).  He is very motivated to rehabilitate and knows he has to work hard.  Pt is glad to be back on CIR after a stay July 2015-August 2015. Recent Psychosocial Issues: none reported Psychiatric History: none reported Substance Abuse History: none reported  Patient / Family Perceptions, Expectations & Goals Pt/Family understanding of illness & functional limitations: Pt reports a good understanding of his condition and limitations and understands how Rehab works. Premorbid pt/family  roles/activities: Pt was working Warehouse manager at [employer], did work around [employer], and went to [employer]. Anticipated changes in roles/activities/participation: Pt hopes to resume activities as he is able. Pt/family expectations/goals: Pt wants to get back to using his cane only and wants to return to being at grandchildren's basketball games.  Community [employer] Agencies: None Premorbid Home Care/DME Agencies: Other (Comment) (Pt has had [facility] in the past, DME from [place], and went to [facility].) Transportation available at discharge: family  Discharge Planning Living Arrangements: Spouse/significant other Support Systems: Spouse/significant other, Children, Other relatives, Friends/neighbors, Other (Comment) Type of Residence: Private residence Insurance Resources: Medicare ([employer]) Museum/gallery curator Resources: Employment, Radio broadcast assistant Screen Referred: No Money Management: Patient Does the patient have any problems obtaining your medications?: No Home Management: Pt and wife both share household chores. Patient/Family Preliminary Plans: Pt plans to return to his home with his wife to assist as she is able.  Pt did have unexpected gall bladder surgery herself, so children may need to assist, as well.  CSW will continue to assess this over pt's stay. Social Work Anticipated Follow Up Needs: HH/OP Expected length of stay: 1 1/2 - 2 weeks  Clinical Impression CSW met with pt who has been on CIR in the past.  He is pleased  to be back and is motivated to get better/stronger and get home to help his wife who had unexpected surgery while pt was in the hospital.  Pt wants to get back to using his cane and would prefer to go for outpt surgery at d/c.  Pt's wife was assisting pt some PTA, but not sure what she will be able to help with post op.  CSW will continue to follow and assist pt/family with plans.  Hassan Blackshire,  [name] 10/09/2017, 1:52 PM

## 2017-10-09 NOTE — Progress Notes (Signed)
PHYSICAL MEDICINE & REHABILITATION     PROGRESS NOTE    Subjective/Complaints: Had a good night. "ready for to get going!" denies pain  ROS: pt denies nausea, vomiting, diarrhea, cough, shortness of breath or chest pain   Objective: Vital Signs: Blood pressure (!) 149/65, pulse 65, temperature 98 F (36.7 C), temperature source Oral, resp. rate 18, height 6\' 1"  (1.854 m), weight 127.1 kg (280 lb 3.3 oz), SpO2 93 %. No results found.  Recent Labs  10/08/17 0434  WBC 5.4  HGB 11.2*  HCT 34.9*  PLT 157    Recent Labs  10/07/17 0337 10/08/17 0434  NA 140 138  K 3.7 3.9  CL 100* 102  GLUCOSE 142* 167*  BUN 43* 39*  CREATININE 1.45* 1.34*  CALCIUM 8.8* 8.5*   CBG (last 3)   Recent Labs  10/08/17 1628 10/08/17 2047 10/09/17 0631  GLUCAP 179* 152* 191*    Wt Readings from Last 3 Encounters:  10/09/17 127.1 kg (280 lb 3.3 oz)  10/08/17 128.1 kg (282 lb 4.8 oz)  09/01/17 132.5 kg (292 lb)    Physical Exam:  Constitutional: He is oriented to person, place, and time. He appears well-developedand well-nourished. No distress.  Obese male. Up at EOB eating breafkast HENT:  Head: Normocephalicand atraumatic.  Mouth/Throat: Oropharynx is clear and moist.  Eyes: PERRL  Neck: Normal range of motion. Neck supple.  Cardiovascular: RRR without murmur. No JVD .  Respiratory: CTA Bilaterally without wheezes or rales. Normal effort .  GI: Soft. Bowel sounds are normal. He exhibits no distension. There is no tenderness.  Musculoskeletal: He exhibits edema(1 + edema BLE).  Stasis changes BLEpersistent Neurological: He is alertand oriented to person, place, and time.  Skin: Skin is warmand dry. He is not diaphoretic.  Psychiatric: He has a normal mood and affect. His behavior is normal. Judgmentand thought contentnormal.  Motor 4 to 4+/5 in Bilateral deltoid, biceps, triceps, grip 2/5 bilaterally flexor, knee extensor and ankle dorsiflexor. Trace toe  flexors and toe extensors Dec proprioception bilateral great toes, intact at the ankle. Intact sensation in the upper limbs, normal light touch in the upper and lower limbs  Assessment/Plan: 1. 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 Physiatrist is providing close team supervision and 24 hour management of active medical problems listed below. Physiatrist and rehab team continue to assess barriers to discharge/monitor patient progress toward functional and medical goals.  Function:  Bathing Bathing position      Bathing parts      Bathing assist        Upper Body Dressing/Undressing Upper body dressing                    Upper body assist        Lower Body Dressing/Undressing Lower body dressing                                  Lower body assist        Toileting Toileting          Toileting assist     Transfers Chair/bed Clinical biochemist  Cognition Comprehension Comprehension assist level: Follows complex conversation/direction with no assist  Expression Expression assist level: Expresses complex ideas: With no assist  Social Interaction Social Interaction assist level: Interacts appropriately with others - No medications needed.  Problem Solving Problem solving assist level: Solves complex problems: Recognizes & self-corrects  Memory Memory assist level: Complete Independence: No helper   Medical Problem List and Plan: 1. 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 Pt motivated 2. DVT Prophylaxis/Anticoagulation: Pharmaceutical: Lovenox 3. Pain Management: [name] 4. Mood: LCSW to follow for evaluation and  support.  5. Neuropsych: This patient iscapable of making decisions on hisown behalf. 6. Skin/Wound Care: Routine pressure relief measures. Maintain adequate nutritional and hydration status.  7. Fluids/Electrolytes/Nutrition: Strict I/O. Low salt diet.  -I personally reviewed the patient's labs today.  . 8. Acute on chronic diastolic CHF: Strict I/O with daily weights to monitor for overload. ON hydralazine, losartan and lasix.   -weights balanced to decreased currently Filed Weights   10/08/17 1541 10/09/17 0456  Weight: 128.4 kg (283 lb 1.1 oz) 127.1 kg (280 lb 3.3 oz)    9. Chronic hypoxia with new diagnosis of severe obstructive airway disease: Off oxygen now.   -monitor tolerance with therapies 10. HTN: monitor BP bid.  11. OSA: CPAP daily -- . 12.. Acute on chronic CKD: Will need follow up MRI on outpatient basis. Monitor SCr.  -Cr 1.34 today   13. 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  14. Citrobacter UTI: Antibiotic narrowed to Vantin--antibiotic D# 8. Continue for 10 days then dc   LOS (Days) 1 A [place] T, MD 10/09/2017 9:58 AM

## 2017-10-09 NOTE — Evaluation (Signed)
Physical Therapy Assessment and Plan  Patient Details  Name: Travis [name] MRN: [id number] Date of Birth: 1946-08-25  PT Diagnosis: Abnormality of gait, Coordination disorder, Difficulty walking, Low back pain and Muscle weakness Rehab Potential: Good ELOS: 14-16 days   Today's Date: 10/09/2017 PT Individual Time: 0800-0900 PT Individual Time Calculation (min): 60 min    Problem List:  Patient Active Problem List   Diagnosis Date Noted  . 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 Debility 10/08/2017  . Benign essential HTN   . Stage 3 chronic kidney disease ([place])   . Spinal stenosis of lumbar region   . OSA (obstructive sleep apnea)   . Acute blood loss anemia   . Fever 10/02/2017  . Urinary tract infection without hematuria   . Acute on chronic diastolic CHF (congestive heart failure) ([place])   . Chronic diastolic CHF (congestive heart failure) ([place])   . Elevated troponin I level   . Acute on chronic combined systolic and diastolic CHF (congestive heart failure) ([place])   . Type II diabetes mellitus ([place]) 08/19/2015  . HCAP (healthcare-associated pneumonia) 08/19/2015  . Hyperthyroidism 08/19/2015  . Hypothyroid 08/19/2015  . AKI (acute kidney injury) ([place]) 08/19/2015  . Blood poisoning   . Chronic combined systolic and diastolic congestive heart failure ([place]) 08/17/2015  . Restless legs syndrome 03/14/2015  . DOE (dyspnea on exertion) 03/09/2015  . Sinus bradycardia 09/21/2014  . Thoracic spondylosis with myelopathy 09/13/2014  . Acute diastolic heart failure ([place]) 07/18/2014  . Cervical arthritis with myelopathy 07/18/2014  . Constipation - functional   . CAP (community acquired pneumonia) 07/10/2014  . Sepsis ([place]) 07/10/2014  . HTN (hypertension) 07/10/2014  . OSA on CPAP 07/10/2014  . Acute lower UTI 07/10/2014  . Edema leg 07/10/2014  . Acute respiratory failure with hypoxia ([place]) 07/10/2014  . 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 Other vitamin B12 deficiency  anemia 07/13/2013  . 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 Polyneuropathy in other diseases classified elsewhere ([place]) 02/17/2013  . Other general symptoms(780.99) 02/17/2013  . Abnormality of gait 02/17/2013  . Cervical spondylosis with myelopathy 02/17/2013  . Back ache 09/15/2012    Past Medical History:  Past Medical History:  Diagnosis Date  . Cervical myelopathy (H[facility])    with myelomalacia at C5-6 and C3-4  . CHF (congestive heart failure) ([place])   . Constipation - functional    controlled with miralax  . DJD (degenerative joint disease)    WHOLE SPINE  . Dyspnea   . Gait disorder   . Hypertension   . Hyperthyroidism    Had Iodine to resolve issue DX 10 YR AGO  . Hypothyroidism    Low d/t treatment of hyperthyroidism  . 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 Peripheral neuropathy   . Pneumonia   . PONV (postoperative nausea and vomiting)   . RBBB   . Renal disorder    acute kidney failure  . Restless legs syndrome 03/14/2015  . RLS (restless legs syndrome)   . Sleep apnea    DX  2009/2010 [facility]  . 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 Type II diabetes mellitus ([place])    diet controlled   Past Surgical History:  Past Surgical History:  Procedure Laterality Date  . BACK SURGERY     6 surgeries  . CARDIAC CATHETERIZATION [name] 09/07/2015   Procedure: Right/Left Heart Cath and Coronary Angiography;  Surgeon: Peter M Martinique, MD;  Location: [facility] INVASIVE CV  LAB;  Service: Cardiovascular;  Laterality: [name];  . CHOLECYSTECTOMY    . LUMBAR LAMINECTOMY/DECOMPRESSION MICRODISCECTOMY  07/14/2012   Procedure: LUMBAR LAMINECTOMY/DECOMPRESSION MICRODISCECTOMY 2 LEVELS;  Surgeon: Eustace Moore, MD;  Location: [facility] NEURO ORS;  Service: Neurosurgery;  Laterality: Left;  Lumbar two three laminectomy, left thoracic four five transpedicular diskectomy    Assessment & Plan Clinical Impression: Travis [name] a 71 y.o.malewith history of HTN,  CKD, thoraco-cervical myelopathy with quadriparesis, and progressive decline in mobility, severe spinal stenosis L4/5, peripheral neuropathy, OSA, diastolic CHF who was admitted on 10/02/17 with progressive SOBand BLE edema due to volume overload. He was treated with IV diuresis for acute on chronic diastolic CHF with improvement but continues to have hypoxia with activity. Cardiology question pulmonary component and PFTs revealed severe obstructive lung disease. He has diuresed 6 L and renal status has been stable. Wife admitted yesterday for GB attack requiring surgery. He has had recent workup revealing severe L4-5 spinal stenosis and was being referred to Dr. Ronnald Ramp for surgical consideration. He has had decline in mobility as well as decline in ability to carry out ADLs CIR recommended for follow up therapy.   Patient states he was still working about 6 hours per day and driving, he had a decline in function, starting about 1 month prior to admission  Patient transferred to [place] on 10/08/2017 .   Patient currently requires max with mobility secondary to muscle weakness and muscle joint tightness, decreased cardiorespiratoy endurance, impaired timing and sequencing, unbalanced muscle activation and decreased coordination and decreased sitting balance, decreased standing balance, decreased postural control and decreased balance strategies.  Prior to hospitalization, patient was supervision with mobility and lived with Spouse in a House home.  Home access is  Ramped entrance.  Patient will benefit from skilled PT intervention to maximize safe functional mobility, minimize fall risk and decrease caregiver burden for planned discharge home with 24 hour supervision.  Anticipate patient will benefit from follow up OP at discharge.  PT - End of Session Activity Tolerance: Tolerates 30+ min activity with multiple rests Endurance Deficit: Yes Endurance Deficit Description: desaturates to 80's with  activity; requires frequent rest breaks PT Assessment Rehab Potential (ACUTE/IP ONLY): Good PT Barriers to Discharge: Decreased caregiver support PT Plan PT Intensity: Minimum of 1-2 x/day ,45 to 90 minutes PT Frequency: 5 out of 7 days PT Duration Estimated Length of Stay: 14-16 days PT Treatment/Interventions: Ambulation/gait training;Discharge planning;Functional mobility training;Psychosocial support;Therapeutic Activities;Wheelchair propulsion/positioning;Therapeutic Exercise;Neuromuscular re-education;Disease management/prevention;Balance/vestibular training;Cognitive remediation/compensation;DME/adaptive equipment instruction;Pain management;Splinting/orthotics;UE/LE Strength taining/ROM;UE/LE Coordination activities;Patient/family education;Community reintegration PT Recommendation Recommendations for Other Services: Therapeutic Recreation consult Therapeutic Recreation Interventions: Outing/community reintergration;Kitchen group;Pet therapy Follow Up Recommendations: Outpatient PT Patient destination: Home Equipment Details: likely none needed; pt has RW and w/c  Skilled Therapeutic Intervention Pt received seated on EOB; denies pain and agreeable to treatment. PT initial evaluation performed and completed as described below with variable min>maxA overall, increased assist for sit >stand depending on height of support surface and UE support available (arm rests vs no arm rests). Educated pt on rehab process, goals, estimated LOS to be determined once OT evaluations completed. Pt agreeable to all the above. Remained seated in w/c at end of session, all needs in reach.   PT Evaluation Precautions/Restrictions Precautions Precautions: Fall Precaution Comments: sats dropped to 86% with activity, returned to WNL with 1-2 min rest breaks Restrictions Weight Bearing Restrictions: No General Chart Reviewed: Yes Response to Previous Treatment: Not applicable Family/Caregiver Present: No   Vital Signs: Desaturates  to 86% with mobility activities; recovers to >93% with 1-2 min rest breaks with cues for deep breathing Home Living/Prior Functioning Home Living Living Arrangements: Spouse/significant other Vision/Perception  Vision - Assessment Eye Alignment: Within Functional Limits Ocular Range of Motion: Within Functional Limits Tracking/Visual Pursuits: Able to track stimulus in all quads without difficulty Saccades: Within functional limits Convergence: Within functional limits Perception Perception: Within Functional Limits Praxis Praxis: Intact  Cognition Overall Cognitive Status: Within Functional Limits for tasks assessed Arousal/Alertness: Awake/alert Orientation Level: Oriented X4 Attention: Selective Selective Attention: Appears intact Memory: Appears intact Awareness: Appears intact Problem Solving: Appears intact Sensation Sensation Light Touch: Appears Intact Proprioception: Appears Intact Coordination Gross Motor Movements are Fluid and Coordinated: No Heel Shin Test: impaired BLE, decreased excursion and speed Motor     Mobility Transfers Sit to Stand: 3: Mod assist Sit to Stand Details: Tactile cues for initiation;Tactile cues for weight shifting;Tactile cues for sequencing;Verbal cues for technique;Verbal cues for precautions/safety;Manual facilitation for weight shifting Locomotion  Ambulation Ambulation: Yes Ambulation/Gait Assistance: 4: Min assist Ambulation Distance (Feet): 89 Feet Assistive device: Rolling walker Ambulation/Gait Assistance Details: Verbal cues for precautions/safety;Verbal cues for technique;Tactile cues for posture Gait Gait: Yes Gait Pattern: Impaired Gait Pattern: Poor foot clearance - left;Decreased dorsiflexion - left;Decreased stride length;Step-to pattern;Shuffle;Decreased trunk rotation;Trunk flexed;Lateral hip instability Gait velocity: reduced Stairs / Additional Locomotion Stairs: No Engineer, manufacturing: Yes Wheelchair Assistance: 5: Investment banker, operational Details: Verbal cues for technique;Verbal cues for Information systems manager: Both upper extremities Wheelchair Parts Management: Needs assistance Distance: 100'  Trunk/Postural Assessment  Cervical Assessment Cervical Assessment: Exceptions to [facility] (head forward) Thoracic Assessment Thoracic Assessment: Exceptions to [facility] (rounded shoulders) Lumbar Assessment Lumbar Assessment: Exceptions to [facility] (posterior pelvic tilt) Postural Control Postural Control: Within Functional Limits  Balance Balance Balance Assessed: Yes Dynamic Sitting Balance Dynamic Sitting - Level of Assistance: 5: Stand by assistance Dynamic Sitting - Balance Activities: Lateral lean/weight shifting Sitting balance - Comments: bathing Dynamic Standing Balance Dynamic Standing - Balance Support: No upper extremity supported Dynamic Standing - Level of Assistance: 4: Min assist Dynamic Standing - Comments: fastening pants; posterior lean Extremity Assessment  RUE Assessment RUE Assessment: Within Functional Limits LUE Assessment LUE Assessment: Within Functional Limits RLE Assessment RLE Assessment: Exceptions to [facility] (grossly 4-/5 throughout) LLE Assessment LLE Assessment: Exceptions to [facility] (grossly 4-/5 throughout)   See Function Navigator for Current Functional Status.   Refer to Care Plan for Long Term Goals  Recommendations for other services: Therapeutic Recreation  Pet therapy, Kitchen group and Outing/community reintegration  Discharge Criteria: Patient will be discharged from PT if patient refuses treatment 3 consecutive times without medical reason, if treatment goals not met, if there is a change in medical status, if patient makes no progress towards goals or if patient is discharged from hospital.  The above assessment, treatment plan, treatment alternatives and goals were discussed and  mutually agreed upon: by patient  Luberta Mutter 10/09/2017, 10:08 AM

## 2017-10-10 ENCOUNTER — Inpatient Hospital Stay (HOSPITAL_COMMUNITY): Payer: Medicare Other | Admitting: Physical Therapy

## 2017-10-10 ENCOUNTER — Inpatient Hospital Stay (HOSPITAL_COMMUNITY): Payer: Medicare Other | Admitting: Occupational Therapy

## 2017-10-10 DIAGNOSIS — Z8679 Personal history of other diseases of the circulatory system: Secondary | ICD-10-CM

## 2017-10-10 LAB — GLUCOSE, CAPILLARY
GLUCOSE-CAPILLARY: 196 mg/dL — AB (ref 65–99)
GLUCOSE-CAPILLARY: 149 mg/dL — AB (ref 65–99)
Glucose-Capillary: 166 mg/dL — ABNORMAL HIGH (ref 65–99)
Glucose-Capillary: 157 mg/dL — ABNORMAL HIGH (ref 65–99)

## 2017-10-10 NOTE — Progress Notes (Signed)
Physical Therapy Session Note  Patient Details  Name: Travis [name] MRN: [id number] Date of Birth: July 14, 1946  Today's Date: 10/10/2017 PT Individual Time: 0800-0900and [id number]  PT Individual Time Calculation (min): 60 min and 30 min (total 90 min)  Short Term Goals: Week 1:  PT Short Term Goal 1 (Week 1): Pt will perform bed mobility with min guard PT Short Term Goal 2 (Week 1): Pt will perform sit <>stand consistent minA PT Short Term Goal 3 (Week 1): Pt will perform gait x150' with minA PT Short Term Goal 4 (Week 1): Pt will perform car transfer minA  Skilled Therapeutic Interventions/Progress Updates: Tx1: Pt received seated on EOB; denies pain and agreeable to treatment. Shoes donned totalA; threads pants legs with S and increased time, requires assist for pulling up and fastening pants. Sit >stand from elevated bed with modA and RW. Stand pivot transfer with RW and min guard. W/c propulsion BUE x150' with S for strengthening and endurance. Stand pivot transfer w/c <>mat table with RW and modA to boost to stand, min guard pivotal steps. Sit <>stand 2x5 reps with min/modA and elevated mat table. During rest breaks performed BLE hamstring stretch 2x1 min each.  Educated pt on role of limited hamstring length on lumbopelvic alignment and correlation to back pain. Static standing on red foam wedge for gastroc stretch 2x2 min. 1x15 reps heel raises with BUE support on RW. Stand pivot to return to w/c with RW and modA. W/c propulsion to return to room x150'. Remained seated in w/c at end of session, all needs in reach.   Tx 2: Pt received seated in w/c, denies pain and agreeable to treatment. W/c propulsion BUE x150' with S. Stand pivot transfer w/c<>nustep with RW and maxA to boost to stand, minA pivotal steps. Performed BUE/BLE nustep on level 3 x8 min with average 50 steps/min. Seated soleus stretch in w/c x3 min. Demonstrated to pt gastroc/hamstring stretch with quick release belt. Performed  hip ER stretch with leg lifter 2x1 min each LE. Returned to room w/c propulsion with S. Transfer to recliner with modA; all needs in reach at completion of session.     Therapy Documentation Precautions:  Precautions Precautions: Fall Precaution Comments: sats dropped to 86% with activity, returned to WNL with 1-2 min rest breaks Restrictions Weight Bearing Restrictions: No   See Function Navigator for Current Functional Status.   Therapy/Group: Individual Therapy  Luberta Mutter 10/10/2017, 9:00 AM

## 2017-10-10 NOTE — Progress Notes (Signed)
Pt wear home CPAP, he administers CPAP on his own.  No assistance needed.

## 2017-10-10 NOTE — Progress Notes (Signed)
[facility] PHYSICAL MEDICINE & REHABILITATION     PROGRESS NOTE    Subjective/Complaints: No issues overnite, pt feels like he needs to void , cannot reach urinal  ROS: pt denies nausea, vomiting, diarrhea, cough, shortness of breath or chest pain   Objective: Vital Signs: Blood pressure (!) 171/63, pulse (!) 53, temperature 97.8 F (36.6 C), temperature source Oral, resp. rate 16, height 6\' 1"  (1.854 m), weight 126.9 kg (279 lb 12.2 oz), SpO2 91 %. No results found.  Recent Labs  10/08/17 0434  WBC 5.4  HGB 11.2*  HCT 34.9*  PLT 157    Recent Labs  10/08/17 0434  NA 138  K 3.9  CL 102  GLUCOSE 167*  BUN 39*  CREATININE 1.34*  CALCIUM 8.5*   CBG (last 3)   Recent Labs  10/09/17 1703 10/09/17 2122 10/10/17 0658  GLUCAP 158* 184* 166*    Wt Readings from Last 3 Encounters:  10/10/17 126.9 kg (279 lb 12.2 oz)  10/08/17 128.1 kg (282 lb 4.8 oz)  09/01/17 132.5 kg (292 lb)    Physical Exam:  Constitutional: He is oriented to person, place, and time. He appears well-developedand well-nourished. No distress.  Obese male. Up at EOB eating breafkast HENT:  Head: Normocephalicand atraumatic.  Mouth/Throat: Oropharynx is clear and moist.  Eyes: PERRL  Neck: Normal range of motion. Neck supple.  Cardiovascular: RRR without murmur. No JVD .  Respiratory: CTA Bilaterally without wheezes or rales. Normal effort .  GI: Soft. Bowel sounds are normal. He exhibits no distension. There is no tenderness.  Musculoskeletal: He exhibits edema(2 + edema BLE).  Stasis changes BLEpersistent Neurological: He is alertand oriented to person, place, and time.  Skin: Skin is warmand dry. He is not diaphoretic.  Psychiatric: He has a normal mood and affect. His behavior is normal. Judgmentand thought contentnormal.  Motor 4 to 4+/5 in Bilateral deltoid, biceps, triceps, grip 3-/5 bilaterally flexor, knee extensor and ankle dorsiflexor. Trace toe flexors and toe  extensors Dec proprioception bilateral great toes, intact at the ankle. Intact sensation in the upper limbs, normal light touch in the upper and lower limbs  Assessment/Plan: 1. 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 Physiatrist is providing close team supervision and 24 hour management of active medical problems listed below. Physiatrist and rehab team continue to assess barriers to discharge/monitor patient progress toward functional and medical goals.  Function:  Bathing Bathing position      Bathing parts Body parts bathed by patient: Right arm, Left arm, Chest, Front perineal area, Abdomen, Right upper leg, Left upper leg Body parts bathed by helper: Right lower leg, Left lower leg, Back, Buttocks  Bathing assist Assist Level: Touching or steadying assistance(Pt > 75%)      Upper Body Dressing/Undressing Upper body dressing   What is the patient wearing?: Pull over shirt/dress     Pull over shirt/dress - Perfomed by patient: Thread/unthread right sleeve, Put head through opening, Thread/unthread left sleeve, Pull shirt over trunk          Upper body assist Assist Level: Supervision or verbal cues      Lower Body Dressing/Undressing Lower body dressing   What is the patient wearing?: Pants, Ted Hose, Shoes   Underwear - Performed by helper: Thread/unthread right underwear leg, Pull underwear up/down, Thread/unthread left underwear leg Pants- Performed by  patient: Thread/unthread right pants leg, Thread/unthread left pants leg Pants- Performed by helper: Pull pants up/down, Fasten/unfasten pants           Shoes - Performed by helper: Don/doff right shoe, Don/doff left shoe, Fasten right, Fasten left       TED Hose - Performed by helper: Don/doff right TED hose, Don/doff left TED hose   Lower body assist Assist for lower body dressing: Touching or steadying assistance (Pt > 75%)      Toileting Toileting Toileting activity did not occur: No continent bowel/bladder event        Toileting assist     Transfers Chair/bed transfer   Chair/bed transfer method: Stand pivot Chair/bed transfer assist level: Moderate assist (Pt 50 - 74%/lift or lower) Chair/bed transfer assistive device: Armrests, Medical sales representative     Max distance: 89 Assist level: Touching or steadying assistance (Pt > 75%)   Wheelchair   Type: Manual Max wheelchair distance: 150 Assist Level: Supervision or verbal cues  Cognition Comprehension Comprehension assist level: Follows complex conversation/direction with no assist  Expression Expression assist level: Expresses complex ideas: With no assist  Social Interaction Social Interaction assist level: Interacts appropriately with others - No medications needed.  Problem Solving Problem solving assist level: Solves complex problems: Recognizes & self-corrects  Memory Memory assist level: Complete Independence: No helper   Medical Problem List and Plan: 1. 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 Pt motivated 2. DVT Prophylaxis/Anticoagulation: Pharmaceutical: Lovenox 3. Pain Management: [name] 4. Mood: LCSW to follow for evaluation and support.  5. Neuropsych: This patient iscapable of making decisions on hisown behalf. 6. Skin/Wound Care: Routine pressure relief measures. Maintain adequate nutritional and hydration status.  7. Fluids/Electrolytes/Nutrition: Strict I/O. Low salt diet.  -I personally reviewed the patient's labs today.  . 8. Acute on chronic diastolic CHF: Strict I/O with daily weights to monitor for overload. ON hydralazine, losartan and lasix.   -weights balanced to decreased currently 10/13 Filed Weights    10/08/17 1541 10/09/17 0456 10/10/17 0518  Weight: 128.4 kg (283 lb 1.1 oz) 127.1 kg (280 lb 3.3 oz) 126.9 kg (279 lb 12.2 oz)    9. Chronic hypoxia with new diagnosis of severe obstructive airway disease: Off oxygen now.   -monitor tolerance with therapies 10. HTN: monitor BP bid.  11. OSA: CPAP daily -- . 12.. Acute on chronic CKD: Will need follow up MRI on outpatient basis. Monitor SCr.  -Cr 1.34 today   13. 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  14. Citrobacter UTI: Antibiotic narrowed to Vantin--antibiotic D# 8. Continue for 10 days then dc   LOS (Days) 2 A FACE TO FACE EVALUATION WAS PERFORMED  Charlett Blake, MD 10/10/2017 10:12 AM

## 2017-10-10 NOTE — Progress Notes (Signed)
Occupational Therapy Session Note  Patient Details  Name: Travis [name] MRN: [id number] Date of Birth: 1946/09/29  Today's Date: 10/10/2017 OT Individual Time: 1100-1200 ; 1300-1345 OT Individual Time Calculation (min): 60 min ; 45 min   Short Term Goals: Week 1:  OT Short Term Goal 1 (Week 1): Pt will sit to stand wiht MIN A in prep for clothing managment OT Short Term Goal 2 (Week 1): Pt will thread BLE into pants wiht AE PRN and supervision OT Short Term Goal 3 (Week 1): Pt will groom in standing 2/2 items to improve functional endurance OT Short Term Goal 4 (Week 1): Pt will complete clothing manamgent prior to toileting with touching A OT Short Term Goal 5 (Week 1): Pt will complete shower transfer with min A  Skilled Therapeutic Interventions/Progress Updates:    Treatment session focused on ADLs/self care training, transfer training, safety awareness, endurance training, education, balance training. Upon entering pt up in w/c and agreeable to AM ADLs. Pt completed transfers with mod A throughout session from different surfaces with v/c for hand/foot placement. Pt utilized personal RW to ambulate into bathroom for shower using grab bars and shower bench. Pt required max A for donning/doffing foot wear. Pt instructed on energy conservation techniques during bathing and dressing. Pt verbalized he has reacher at home for LB dressing. Therapist assisted with Looping pants/underwear and TED. Pt completed sit<>stands with mod A to completed LB dressing task. Grooming completed at sink at standing level with 1 UE support. Pt completed functional mobility at w/c level on unit in hallway to increase UE endurance.   Session 2: Pt was reassessed in w/c, noted to have difficulty with sit<>stnad d/t low chair. Therapist unsuccessful with locating higher w/c. Cushion was assessed and no success with finding bigger cushion to create height to w/c seat. Therapist instructed pt on sit<>stand techniques,  continues to need reminders to bring feet farther under him prior to standing. Pt completed toileting task and requested standing at toilet to urinate and completed task with mod A for transfer and min A for standing balance without support. Pt able to manage clothing with S. Pt completed there activity for static balance and endurance while using reacher to pick up objects on floor. Therapist issued rehab reacher for pts use while in rehab. Pt returned to his room to rest before next therapy session.     Therapy Documentation Precautions:  Precautions Precautions: Fall Precaution Comments: sats dropped to 86% with activity, returned to WNL with 1-2 min rest breaks Restrictions Weight Bearing Restrictions: No Vital Signs: Oxygen Therapy SpO2: 91 % O2 Device: Not Delivered Pain: Pain Assessment Pain Assessment: No/denies pain  See Function Navigator for Current Functional Status.   Therapy/Group: Individual Therapy  Delon [place] 10/10/2017, 12:31 PM

## 2017-10-11 ENCOUNTER — Inpatient Hospital Stay (HOSPITAL_COMMUNITY): Payer: Medicare Other

## 2017-10-11 LAB — GLUCOSE, CAPILLARY
GLUCOSE-CAPILLARY: 154 mg/dL — AB (ref 65–99)
Glucose-Capillary: 189 mg/dL — ABNORMAL HIGH (ref 65–99)
Glucose-Capillary: 155 mg/dL — ABNORMAL HIGH (ref 65–99)
Glucose-Capillary: 167 mg/dL — ABNORMAL HIGH (ref 65–99)

## 2017-10-11 MED ORDER — ALUM & MAG HYDROXIDE-SIMETH 200-200-20 MG/5ML PO SUSP
30.0000 mL | Freq: Four times a day (QID) | ORAL | Status: DC | PRN
  Administered 2017-10-11: 30 mL via ORAL
  Filled 2017-10-11: qty 30

## 2017-10-11 NOTE — Progress Notes (Signed)
[place] [facility]ICINE & REHABILITATION     PROGRESS NOTE    Subjective/Complaints:  No issues overnite  ROS: pt denies nausea, vomiting, diarrhea, cough, shortness of breath or chest pain   Objective: Vital Signs: Blood pressure (!) 145/66, pulse 61, temperature 98.4 F (36.9 C), temperature source Oral, resp. rate 16, height 6\' 1"  (1.854 m), weight 128.3 kg (282 lb 13.6 oz), SpO2 94 %. No results found. No results for input(s): WBC, HGB, HCT, PLT in the last 72 hours. No results for input(s): NA, K, CL, GLUCOSE, BUN, CREATININE, CALCIUM in the last 72 hours.  Invalid input(s): CO CBG (last 3)   Recent Labs  10/10/17 1628 10/10/17 2159 10/11/17 0631  GLUCAP 149* 157* 189*    Wt Readings from Last 3 Encounters:  10/11/17 128.3 kg (282 lb 13.6 oz)  10/08/17 128.1 kg (282 lb 4.8 oz)  09/01/17 132.5 kg (292 lb)    Physical Exam:  Constitutional: He is oriented to person, place, and time. He appears well-developedand well-nourished. No distress.  Obese male. Up at EOB eating breafkast HENT:  Head: Normocephalicand atraumatic.  Mouth/Throat: Oropharynx is clear and moist.  Eyes: PERRL  Neck: Normal range of motion. Neck supple.  Cardiovascular: RRR without murmur. No JVD .  Respiratory: CTA Bilaterally without wheezes or rales. Normal effort .  GI: Soft. Bowel sounds are normal. He exhibits no distension. There is no tenderness.  Musculoskeletal: He exhibits edema(2 + edema BLE).  Stasis changes BLEpersistent Neurological: He is alertand oriented to person, place, and time.  Skin: Skin is warmand dry. He is not diaphoretic.  Psychiatric: He has a normal mood and affect. His behavior is normal. Judgmentand thought contentnormal.  Motor 4 to 4+/5 in Bilateral deltoid, biceps, triceps, grip 3-/5 bilaterally flexor, knee extensor and ankle dorsiflexor. Trace toe flexors and toe extensors Dec proprioception bilateral great toes, intact at the ankle. Intact  sensation in the upper limbs, normal light touch in the upper and lower limbs  Assessment/Plan: 1. 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 Physiatrist is providing close team supervision and 24 hour management of active medical problems listed below. Physiatrist and rehab team continue to assess barriers to discharge/monitor patient progress toward functional and medical goals.  Function:  Bathing Bathing position   Position: Shower  Bathing parts Body parts bathed by patient: Right arm, Left arm, Chest, Front perineal area, Abdomen, Right upper leg, Left upper leg Body parts bathed by helper: Buttocks, Right lower leg, Left lower leg, Back  Bathing assist Assist Level: Touching or steadying assistance(Pt > 75%)      Upper Body Dressing/Undressing Upper body dressing   What is the patient wearing?: Pull over shirt/dress     Pull over shirt/dress - Perfomed by patient: Thread/unthread right sleeve, Put head through opening, Thread/unthread left sleeve, Pull shirt over trunk          Upper body assist Assist Level: Supervision or verbal cues      Lower Body Dressing/Undressing Lower body dressing   What is the patient wearing?: Pants, [employer], Shoes, Underwear Underwear - Performed by patient: Pull underwear up/down Underwear - Performed by helper: Thread/unthread right underwear leg, Thread/unthread left underwear leg, Pull underwear up/down Pants- Performed by patient: Fasten/unfasten pants, Pull pants up/down Pants- Performed by helper: Pull pants up/down, Thread/unthread left pants leg, Thread/unthread right pants  leg           Shoes - Performed by helper: Don/doff right shoe, Don/doff left shoe, Fasten right, Fasten left       TED Hose - Performed by helper: Don/doff right TED hose,  Don/doff left TED hose  Lower body assist Assist for lower body dressing: Touching or steadying assistance (Pt > 75%)      Toileting Toileting Toileting activity did not occur: No continent bowel/bladder event Toileting steps completed by patient: Adjust clothing prior to toileting, Adjust clothing after toileting   Toileting Assistive Devices: Grab bar or rail  Toileting assist Assist level: Touching or steadying assistance (Pt.75%)   Transfers Chair/bed transfer   Chair/bed transfer method: Stand pivot Chair/bed transfer assist level: Moderate assist (Pt 50 - 74%/lift or lower) Chair/bed transfer assistive device: Armrests, Medical sales representative     Max distance: 89 Assist level: Touching or steadying assistance (Pt > 75%)   Wheelchair   Type: Manual Max wheelchair distance: 150 Assist Level: Supervision or verbal cues  Cognition Comprehension Comprehension assist level: Follows complex conversation/direction with no assist  Expression Expression assist level: Expresses complex ideas: With no assist  Social Interaction Social Interaction assist level: Interacts appropriately with others - No medications needed.  Problem Solving Problem solving assist level: Solves complex problems: Recognizes & self-corrects  Memory Memory assist level: Complete Independence: No helper   Medical Problem List and Plan: 1. 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 DVT Prophylaxis/Anticoagulation: Pharmaceutical: Lovenox 3. Pain Management: [name] 4. Mood: LCSW to follow for evaluation and support.  5. Neuropsych: This patient iscapable of making decisions on hisown behalf. 6. Skin/Wound Care: Routine pressure relief measures. Maintain adequate nutritional and hydration status.  7. Fluids/Electrolytes/Nutrition: Strict I/O. Low salt diet.  -I personally reviewed the patient's labs today.   . 8. Acute on chronic diastolic CHF: Strict I/O with daily weights to monitor for overload. ON hydralazine, losartan and lasix.   -weights balanced to decreased currently 10/14 Filed Weights   10/09/17 0456 10/10/17 0518 10/11/17 0548  Weight: 127.1 kg (280 lb 3.3 oz) 126.9 kg (279 lb 12.2 oz) 128.3 kg (282 lb 13.6 oz)    9. Chronic hypoxia with new diagnosis of severe obstructive airway disease: Off oxygen now.   -monitor tolerance with therapies 10. HTN: monitor BP bid.  11. OSA: CPAP daily -- . 12.. Acute on chronic CKD: Will need follow up MRI on outpatient basis. Monitor SCr.  -Cr 1.34 today   13. 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  14. Citrobacter UTI: completed Antibiotic  LOS (Days) 3 A FACE TO FACE EVALUATION WAS PERFORMED  Charlett Blake, MD 10/11/2017 9:09 AM

## 2017-10-11 NOTE — Progress Notes (Signed)
Pt wear home CPAP, he administers CPAP on his own .

## 2017-10-12 ENCOUNTER — Inpatient Hospital Stay (HOSPITAL_COMMUNITY): Payer: Medicare Other | Admitting: Occupational Therapy

## 2017-10-12 ENCOUNTER — Inpatient Hospital Stay (HOSPITAL_COMMUNITY): Payer: Medicare Other | Admitting: Physical Therapy

## 2017-10-12 ENCOUNTER — Inpatient Hospital Stay (HOSPITAL_COMMUNITY): Payer: Medicare Other

## 2017-10-12 LAB — GLUCOSE, CAPILLARY
GLUCOSE-CAPILLARY: 178 mg/dL — AB (ref 65–99)
GLUCOSE-CAPILLARY: 225 mg/dL — AB (ref 65–99)
Glucose-Capillary: 150 mg/dL — ABNORMAL HIGH (ref 65–99)

## 2017-10-12 MED ORDER — SIMETHICONE 80 MG PO CHEW
80.0000 mg | CHEWABLE_TABLET | Freq: Four times a day (QID) | ORAL | Status: DC | PRN
  Administered 2017-10-12 – 2017-10-16 (×2): 80 mg via ORAL
  Filled 2017-10-12 (×3): qty 1

## 2017-10-12 NOTE — Progress Notes (Addendum)
Physical Therapy Note  Patient Details  Name: Travis [name] MRN: [id number] Date of Birth: [birth year] Today's Date: 53 min individual tx Pain:none per pt  W/c propulsion using bil UEs with supervision, with instruction for efficiency of movement and turns.    Neuromuscular re-education via forced use, multimodal cues for seated: hip flex/ext with bil UEs in front as counter weight, 3 x 10.  Stand pivot w/c> mat to L with RW.  Extra time for sit> stand due to difficulty bringing bil feet under trunk.  Standing sustained stretch bil heel cords, hamstrings x 15 minutes during wt shifting, reaching over head with either hand for card matching activity.  Reciprocal scooting forward/backward with mod assist for pelvic flexibility. Therapeutic activity reaching laterally with either hand to facilitate trunk shortening/lengthening.   After extensive stretching and activities, pt was able to place bil feet under his trunk before he moved sit> stand without assistance.  Gait training with RW x 75' on level tile, shoe cover L forefoot, min assist, mod cues for increased bil hip flexion to clear feet, appropriate distance from Rw. Pt left resting in w/c with all needs within reach.  See function navigator for current status.  Celedonio Sortino 10/12/2017, 1:14 PM

## 2017-10-12 NOTE — Progress Notes (Signed)
Physical Therapy Session Note  Patient Details  Name: NASHAUN HILLMER MRN: [id number] Date of Birth: 1946/06/06  Today's Date: 10/12/2017 PT Individual Time: 0845-1000 PT Individual Time Calculation (min): 75 min   Short Term Goals: Week 1:  PT Short Term Goal 1 (Week 1): Pt will perform bed mobility with min guard PT Short Term Goal 2 (Week 1): Pt will perform sit <>stand consistent minA PT Short Term Goal 3 (Week 1): Pt will perform gait x150' with minA PT Short Term Goal 4 (Week 1): Pt will perform car transfer minA  Skilled Therapeutic Interventions/Progress Updates: Pt received seated in w/c, denies pain and agreeable to treatment. TEDs and shoes donned totalA. W/c propulsion to/from gym for UE strengthening and endurance. Stand pivot transfer w/c >mat table modA to boost to standing, min guard pivotal steps Seated soleus stretch x3 min. Standing gastroc stretch with RW and blue wedge. Seated hamstring stretch 2x1 min each LE. Sit <>stand from elevated mat table with LEs on wedge for functional stretch to soleus; performed 3 sets 5 reps with min guard, mat table progressively lowered to increase challenge and stretch. Sit <>supine modA. Supine hip flexor stretch x2 min each side, assist from therapist to facilitate quad stretch simultaneously. 2 sets 5 reps bridging; required instruction to reduce ROM and amount of hip elevation off table to reduce back pain, focused on glute activation in isometric position. Gait x150' to return to room with min guard; improving step length, gait speed compared to previous sessions. Educated pt in importance of ankle range, dorsiflexor strengthening to improve foot clearance and safety with gait. Pt verbalizes goal of being able to walk with a cane; discussed that its not out of the question but would likely not be a progression while in rehab as pt is very weak and stiff throughout LEs, but could continue progressing toward this goal in HH/OP PT. Standing  heel/toe raises x15 each with BUE support on RW. Minimal foot clearance during toe raises d/t dorsiflexor weakness and limited ankle range. Remained seated in w/c at end of session, all needs in reach.      Therapy Documentation Precautions:  Precautions Precautions: Fall Precaution Comments: sats dropped to 86% with activity, returned to WNL with 1-2 min rest breaks Restrictions Weight Bearing Restrictions: No Pain: Pain Assessment Pain Assessment: No/denies pain Pain Score: 0-No pain   See Function Navigator for Current Functional Status.   Therapy/Group: Individual Therapy  Luberta Mutter 10/12/2017, 9:59 AM

## 2017-10-12 NOTE — Progress Notes (Signed)
[place] PHYSICAL MEDICINE & REHABILITATION     PROGRESS NOTE    Subjective/Complaints:  Had a good weekend. Feels that he's getting stronger. Breathing more easily   ROS: pt denies nausea, vomiting, diarrhea, cough, shortness of breath or chest pain   Objective: Vital Signs: Blood pressure (!) 154/72, pulse 62, temperature 97.6 F (36.4 C), temperature source Oral, resp. rate 18, height 6\' 1"  (1.854 m), weight 126.6 kg (279 lb 1.6 oz), SpO2 94 %. No results found. No results for input(s): WBC, HGB, HCT, PLT in the last 72 hours. No results for input(s): NA, K, CL, GLUCOSE, BUN, CREATININE, CALCIUM in the last 72 hours.  Invalid input(s): CO CBG (last 3)   Recent Labs  10/11/17 1145 10/11/17 1634 10/11/17 2041  GLUCAP 155* 154* 167*    Wt Readings from Last 3 Encounters:  10/12/17 126.6 kg (279 lb 1.6 oz)  10/08/17 128.1 kg (282 lb 4.8 oz)  09/01/17 132.5 kg (292 lb)    Physical Exam:  Constitutional: He is oriented to person, place, and time. He appears well-developedand well-nourished. No distress.  Obese male. Up at EOB eating breafkast HENT:  Head: Normocephalicand atraumatic.  Mouth/Throat: Oropharynx is clear and moist.  Eyes: PERRL  Neck: Normal range of motion. Neck supple.  Cardiovascular: RRR without murmur. No JVD .  Respiratory:CTA Bilaterally without wheezes or rales. Normal effort  .  GI: Soft. Bowel sounds are normal. He exhibits no distension. There is no tenderness.  Musculoskeletal: He exhibits edema(1 + edema BLE).  Stasis changes BLEpersistent    Skin: Skin is warmand dry. He is not diaphoretic.  Psychiatric: He has a normal mood and affect. His behavior is normal. Judgmentand thought contentnormal.  Neuro: Motor 4 to 4+/5 in Bilateral deltoid, biceps, triceps, grip 3-/5 bilaterally flexor, knee extensor and ankle dorsiflexor. Trace toe flexors and toe extensors---stable exam Dec proprioception bilateral great toes, intact at the  ankle. Intact sensation in the upper limbs, normal light touch in the upper and lower limbs  Assessment/Plan: 1. 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 Physiatrist is providing close team supervision and 24 hour management of active medical problems listed below. Physiatrist and rehab team continue to assess barriers to discharge/monitor patient progress toward functional and medical goals.  Function:  Bathing Bathing position   Position: Shower  Bathing parts Body parts bathed by patient: Right arm, Left arm, Chest, Front perineal area, Abdomen, Right upper leg, Left upper leg, Buttocks, Right lower leg, Left lower leg Body parts bathed by helper: Buttocks, Right lower leg, Left lower leg, Back  Bathing assist Assist Level: No help, No cues      Upper Body Dressing/Undressing Upper body dressing   What is the patient wearing?: Pull over shirt/dress     Pull over shirt/dress - Perfomed by patient: Thread/unthread right sleeve, Thread/unthread left sleeve, Put head through opening, Pull shirt over trunk          Upper body assist Assist Level: No help, No cues      Lower Body Dressing/Undressing Lower body dressing   What is the patient wearing?: Shoes, Ted Hose, Underwear, Pants Underwear - Performed by patient: Pull underwear up/down Underwear - Performed by helper: Thread/unthread right underwear leg, Thread/unthread left underwear leg, Pull underwear up/down Pants- Performed by patient: Thread/unthread right pants leg, Thread/unthread left pants leg, Pull pants  up/down Pants- Performed by helper: Fasten/unfasten pants           Shoes - Performed by helper: Don/doff right shoe, Don/doff left shoe, Fasten right, Fasten left       TED Hose - Performed by helper: Don/doff right TED hose,  Don/doff left TED hose  Lower body assist Assist for lower body dressing: Touching or steadying assistance (Pt > 75%)      Toileting Toileting Toileting activity did not occur: No continent bowel/bladder event Toileting steps completed by patient: Adjust clothing prior to toileting, Performs perineal hygiene, Adjust clothing after toileting   Toileting Assistive Devices: Grab bar or rail  Toileting assist Assist level: Touching or steadying assistance (Pt.75%)   Transfers Chair/bed transfer   Chair/bed transfer method: Stand pivot Chair/bed transfer assist level: Moderate assist (Pt 50 - 74%/lift or lower) Chair/bed transfer assistive device: Armrests, Medical sales representative     Max distance: 150 Assist level: Touching or steadying assistance (Pt > 75%)   Wheelchair   Type: Manual Max wheelchair distance: 150 Assist Level: Supervision or verbal cues  Cognition Comprehension Comprehension assist level: Follows complex conversation/direction with no assist  Expression Expression assist level: Expresses complex ideas: With no assist  Social Interaction Social Interaction assist level: Interacts appropriately with others - No medications needed.  Problem Solving Problem solving assist level: Solves complex problems: Recognizes & self-corrects  Memory Memory assist level: Complete Independence: No helper   Medical Problem List and Plan: 1. 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 DVT Prophylaxis/Anticoagulation: Pharmaceutical: Lovenox 3. Pain Management: [name] 4. Mood: LCSW to follow for evaluation and support.  5. Neuropsych: This patient iscapable of making decisions on hisown behalf. 6. Skin/Wound Care: Routine pressure relief measures. Maintain adequate nutritional and hydration status.  7. Fluids/Electrolytes/Nutrition: Strict I/O. Low salt  diet.  -  . 8. Acute on chronic diastolic CHF: Strict I/O with daily weights to monitor for overload. ON hydralazine, losartan and lasix.   -weights balanced to decreased currently 10/15 Filed Weights   10/10/17 0518 10/11/17 0548 10/12/17 0603  Weight: 126.9 kg (279 lb 12.2 oz) 128.3 kg (282 lb 13.6 oz) 126.6 kg (279 lb 1.6 oz)    9. Chronic hypoxia with new diagnosis of severe obstructive airway disease: Off oxygen now.   -monitor tolerance with therapies 10. HTN: monitor BP bid.  11. OSA: CPAP daily -- . 12.. Acute on chronic CKD: Will need follow up MRI on outpatient basis. Monitor SCr.  -Cr 1.34 10/11   13. 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  14. Citrobacter UTI: completed Antibiotics  -normal emptying patterns    LOS (Days) 4 A FACE TO FACE EVALUATION WAS PERFORMED  Alger Simons T, MD 10/12/2017 10:04 AM

## 2017-10-12 NOTE — Progress Notes (Signed)
Occupational Therapy Session Note  Patient Details  Name: Travis [name] MRN: [id number] Date of Birth: May 16, 1946  Today's Date: 10/12/2017 OT Individual Time: 1030-1130 OT Individual Time Calculation (min): 60 min    Short Term Goals: Week 1:  OT Short Term Goal 1 (Week 1): Pt will sit to stand wiht MIN A in prep for clothing managment OT Short Term Goal 2 (Week 1): Pt will thread BLE into pants wiht AE PRN and supervision OT Short Term Goal 3 (Week 1): Pt will groom in standing 2/2 items to improve functional endurance OT Short Term Goal 4 (Week 1): Pt will complete clothing manamgent prior to toileting with touching A OT Short Term Goal 5 (Week 1): Pt will complete shower transfer with min A  Skilled Therapeutic Interventions/Progress Updates:    Upon entering the room, pt requesting to shower this session. Pt performed sit >stand from wheelchair with mod A for lifting. Pt ambulating 10' into bathroom with RW and min A. Pt sitting on TTB for bathing at shower level. Pt required assistance to wash buttocks, and B LEs. Pt returning to sit in wheelchair in same manner as before to dress. Pt utilized reacher to increase I with LB clothing management. Pt propelled wheelchair 62' with supervision for B UE strengthening and endurance before returning to room. Call bell and all needed items within reach upon exiting the room.   Therapy Documentation Precautions:  Precautions Precautions: Fall Precaution Comments: sats dropped to 86% with activity, returned to WNL with 1-2 min rest breaks Restrictions Weight Bearing Restrictions: No  See Function Navigator for Current Functional Status.   Therapy/Group: Individual Therapy  Gypsy Decant 10/12/2017, 12:43 PM

## 2017-10-13 ENCOUNTER — Inpatient Hospital Stay (HOSPITAL_COMMUNITY): Payer: Medicare Other | Admitting: Physical Therapy

## 2017-10-13 ENCOUNTER — Inpatient Hospital Stay (HOSPITAL_COMMUNITY): Payer: Medicare Other

## 2017-10-13 DIAGNOSIS — I5042 Chronic combined systolic (congestive) and diastolic (congestive) heart failure: Secondary | ICD-10-CM

## 2017-10-13 LAB — GLUCOSE, CAPILLARY
GLUCOSE-CAPILLARY: 163 mg/dL — AB (ref 65–99)
GLUCOSE-CAPILLARY: 172 mg/dL — AB (ref 65–99)
Glucose-Capillary: 193 mg/dL — ABNORMAL HIGH (ref 65–99)
Glucose-Capillary: 195 mg/dL — ABNORMAL HIGH (ref 65–99)

## 2017-10-13 NOTE — Progress Notes (Signed)
Physical Therapy Session Note  Patient Details  Name: Travis [name] MRN: [id number] Date of Birth: 1946/12/01  Today's Date: 10/13/2017 PT Individual Time: 1000-1100 and 1430-1515 PT Individual Time Calculation (min): 60 min and 45 min (total 105 min)   Short Term Goals: Week 1:  PT Short Term Goal 1 (Week 1): Pt will perform bed mobility with min guard PT Short Term Goal 2 (Week 1): Pt will perform sit <>stand consistent minA PT Short Term Goal 3 (Week 1): Pt will perform gait x150' with minA PT Short Term Goal 4 (Week 1): Pt will perform car transfer minA  Skilled Therapeutic Interventions/Progress Updates: Tx 1: Pt received seated in w/c, denies pain and agreeable to treatment. Shoes donned totalA. W/c propulsion x150' with BUE for strengthening and endurance, occasional short rest breaks d/t fatigue. Stand pivot transfer minA to mat table with RW. Seated soleus and hamstring stretches 2x8min each LE. Trunk flexion/anterior weight shift with stability ball pushes 2x15 reps. Trunk rotation "russian twist" with 2kg weighted ball 2x10 reps. Standing gastroc stretch on wedge, 2x10 heel raises, cues for glute activation for increased stretch to gastroc/hip flexors, glute strengthening. Gait x3 trials; first trial with RW and shoe cap on L toe, second trial with PLS AFO, third trial with shoe cap and RW elevated one level to facilitate upright posture. Pt does not tolerate use of AFO d/t increased weight of device increasing workload and making foot clearance more difficult as opposed to less difficult as expected. Pt reports some increased ease of ambulation with RW elevated but no significant functional improvement noted. Pt reports he had toe cap put on shoe last time he was in rehab but per orthotist it "wouldnt work for him"; asked pt to have family bring in shoes to trial. Stand pivot transfer bed>w/c at end of session with minA. Remained seated in w/c at end of session, all needs in reach.    Tx 2: pt received seated in w/c, denies pain and agreeable to treatment. Stand pivot transfer w/c <>nustep with minA. Performed BUE/BLE nustep level 6 with average 50 steps/min for strengthening, aerobic endurance and LE ROM. Performed ascent/descent of ramp with min guard, cues for maintain RW closer to body and larger L step length. Performed car transfer with minA for one LE in/out of car (RLE in, LLE out unable to perform without assist). Gait trial with LLE aggressive ace wrap dorsiflexion assist; improved gait speed. Remained seated in w/c at end of session, all needs in reach.      Therapy Documentation Precautions:  Precautions Precautions: Fall Precaution Comments: sats dropped to 86% with activity, returned to WNL with 1-2 min rest breaks Restrictions Weight Bearing Restrictions: No   See Function Navigator for Current Functional Status.   Therapy/Group: Individual Therapy  Luberta Mutter 10/13/2017, 11:01 AM

## 2017-10-13 NOTE — Progress Notes (Signed)
[place] PHYSICAL MEDICINE & REHABILITATION     PROGRESS NOTE    Subjective/Complaints:  Pt displaying increased activity tolerance with therapy. Walked around [employer] station yesterday. Proud of himself. Slept well  ROS: pt denies nausea, vomiting, diarrhea, cough, shortness of breath or chest pain   Objective: Vital Signs: Blood pressure (!) 149/61, pulse 62, temperature 98.6 F (37 C), temperature source Oral, resp. rate 16, height 6\' 1"  (1.854 m), weight 128.4 kg (283 lb 1.1 oz), SpO2 92 %. No results found. No results for input(s): WBC, HGB, HCT, PLT in the last 72 hours. No results for input(s): NA, K, CL, GLUCOSE, BUN, CREATININE, CALCIUM in the last 72 hours.  Invalid input(s): CO CBG (last 3)   Recent Labs  10/12/17 1140 10/12/17 1623 10/13/17 0654  GLUCAP 150* 225* 193*    Wt Readings from Last 3 Encounters:  10/13/17 128.4 kg (283 lb 1.1 oz)  10/08/17 128.1 kg (282 lb 4.8 oz)  09/01/17 132.5 kg (292 lb)    Physical Exam:  Constitutional: He is oriented to person, place, and time. He appears well-developedand well-nourished. No distress.  Obese male. Up at EOB eating breafkast HENT:  Head: Normocephalicand atraumatic.  Mouth/Throat: Oropharynx is clear and moist.  Eyes: PERRL  Neck: Normal range of motion. Neck supple.  Cardiovascular: RRR without murmur. No JVD  .  Respiratory: CTA Bilaterally without wheezes or rales. Normal effort  .  GI: Soft. Bowel sounds are normal. He exhibits no distension. There is no tenderness.  Musculoskeletal: He exhibits edema(1+ edema BLE).  Stasis changes BLEunchanged    Skin: Skin is warmand dry. He is not diaphoretic.  Psychiatric: He has a normal mood and affect. His behavior is normal. Judgmentand thought contentnormal.  Neuro: Motor 4 to 4+/5 in Bilateral deltoid, biceps, triceps, grip 3-/5 bilaterally flexor, knee extensor and ankle dorsiflexor. Trace toe flexors and toe extensors---no changes Dec  proprioception bilateral great toes, intact essentially at the ankle. Intact sensation in the upper limbs, normal light touch in the upper and lower limbs  Assessment/Plan: 1. 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 Physiatrist is providing close team supervision and 24 hour management of active medical problems listed below. Physiatrist and rehab team continue to assess barriers to discharge/monitor patient progress toward functional and medical goals.  Function:  Bathing Bathing position   Position: Shower  Bathing parts Body parts bathed by patient: Right arm, Left arm, Chest, Front perineal area, Abdomen, Right upper leg, Left upper leg Body parts bathed by helper: Buttocks, Right lower leg, Left lower leg, Back  Bathing assist Assist Level:  (mod A)      Upper Body Dressing/Undressing Upper body dressing   What is the patient wearing?: Pull over shirt/dress     Pull over shirt/dress - Perfomed by patient: Thread/unthread right sleeve, Thread/unthread left sleeve, Put head through opening, Pull shirt over trunk          Upper body assist Assist Level: Set up, Supervision or verbal cues      Lower Body Dressing/Undressing Lower body dressing   What is the patient wearing?: Shoes, Ted Hose, Underwear, Pants Underwear - Performed by patient: Thread/unthread right underwear leg, Thread/unthread left underwear leg, Pull underwear up/down Underwear - Performed by helper: Thread/unthread right underwear leg, Thread/unthread left underwear leg, Pull underwear up/down Pants- Performed by patient: Thread/unthread right  pants leg, Thread/unthread left pants leg, Pull pants up/down Pants- Performed by helper: Fasten/unfasten pants           Shoes - Performed by helper: Don/doff right shoe, Don/doff left  shoe, Fasten right, Fasten left       TED Hose - Performed by helper: Don/doff right TED hose, Don/doff left TED hose  Lower body assist Assist for lower body dressing: Touching or steadying assistance (Pt > 75%), Assistive device Assistive Device Comment: Engineer, maintenance (IT)   Toileting steps completed by patient: Adjust clothing prior to toileting, Performs perineal hygiene, Adjust clothing after toileting Toileting steps completed by helper: Adjust clothing prior to toileting, Performs perineal hygiene, Adjust clothing after toileting Toileting Assistive Devices: Grab bar or rail  Toileting assist Assist level: Touching or steadying assistance (Pt.75%)   Transfers Chair/bed transfer   Chair/bed transfer method: Stand pivot Chair/bed transfer assist level: Touching or steadying assistance (Pt > 75%) Chair/bed transfer assistive device: Armrests, Medical sales representative     Max distance: 150 Assist level: Touching or steadying assistance (Pt > 75%)   Wheelchair   Type: Manual Max wheelchair distance: 150 Assist Level: Supervision or verbal cues  Cognition Comprehension Comprehension assist level: Follows complex conversation/direction with no assist  Expression Expression assist level: Expresses complex ideas: With no assist  Social Interaction Social Interaction assist level: Interacts appropriately with others - No medications needed.  Problem Solving Problem solving assist level: Solves complex problems: Recognizes & self-corrects  Memory Memory assist level: Complete Independence: No helper   Medical Problem List and Plan: 1. 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 DVT Prophylaxis/Anticoagulation: Pharmaceutical: Lovenox 3. Pain Management: [name] 4. Mood: LCSW to follow for evaluation and support.  5.  Neuropsych: This patient iscapable of making decisions on hisown behalf. 6. Skin/Wound Care: Routine pressure relief measures. Maintain adequate nutritional and hydration status.  7. Fluids/Electrolytes/Nutrition: Strict I/O. Low salt diet.  -  . 8. Acute on chronic diastolic CHF: Strict I/O with daily weights to monitor for overload. ON hydralazine, losartan and lasix.   -weights remain steady Filed Weights   10/11/17 0548 10/12/17 0603 10/13/17 0606  Weight: 128.3 kg (282 lb 13.6 oz) 126.6 kg (279 lb 1.6 oz) 128.4 kg (283 lb 1.1 oz)    9. Chronic hypoxia with new diagnosis of severe obstructive airway disease: Off oxygen now.   -activity tolerance showing improvement 10. HTN: monitor BP bid.  11. OSA: CPAP daily -- . 12.. Acute on chronic CKD: Will need follow up MRI on outpatient basis. Monitor SCr.  -Cr 1.34 10/11   13. 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  14. Citrobacter UTI: completed Antibiotics  -normal emptying patterns    LOS (Days) 5 A FACE TO FACE EVALUATION WAS PERFORMED  Alger Simons T, MD 10/13/2017 11:13 AM

## 2017-10-13 NOTE — Progress Notes (Signed)
Occupational Therapy Session Note  Patient Details  Name: Travis [name] MRN: [id number] Date of Birth: 1946/10/22    [phone] 14 min  Short Term Goals: Week 1:  OT Short Term Goal 1 (Week 1): Pt will sit to stand wiht MIN A in prep for clothing managment OT Short Term Goal 2 (Week 1): Pt will thread BLE into pants wiht AE PRN and supervision OT Short Term Goal 3 (Week 1): Pt will groom in standing 2/2 items to improve functional endurance OT Short Term Goal 4 (Week 1): Pt will complete clothing manamgent prior to toileting with touching A OT Short Term Goal 5 (Week 1): Pt will complete shower transfer with min A  Skilled Therapeutic Interventions/Progress Updates:    1:1. Pt eating breakfast upon arrival EOB with MOD I. OT educates on LHSS use for washing B feet while finishing breakfast. Pt sit to stand throughout session with MOD A lifting assistance with VC for safety awareness. Pt completes ambulatory transfers with MIN A for balance and VC for RW management for turns to sit on TTB/BSC. Pt bathes at sit to stand level with same A as stated above to stand as OT washes buttocks. Pt ambulates to [facility] to attempt to void bowel with increased time. Pt dresses at sit to stand level in w/c utilizing reacher to thread B feet into pants. Pt dons shirt with set up. OT dons ted hose and non skid socks. Pt grooms in seated in w/c. Exited session with pt seated in w/c with call light in reach and all needs met.   Therapy Documentation Precautions:  Precautions Precautions: Fall Precaution Comments: sats dropped to 86% with activity, returned to WNL with 1-2 min rest breaks Restrictions Weight Bearing Restrictions: No General:    See Function Navigator for Current Functional Status.   Therapy/Group: Individual Therapy  Tonny Branch 10/13/2017, 7:30 AM

## 2017-10-13 NOTE — Progress Notes (Signed)
Occupational Therapy Session Note  Patient Details  Name: Travis [name] MRN: [id number] Date of Birth: 07/23/1946  Today's Date: 10/13/2017 OT Individual Time: 1400-1430 OT Individual Time Calculation (min): 30 min    Short Term Goals: Week 1:  OT Short Term Goal 1 (Week 1): Pt will sit to stand wiht MIN A in prep for clothing managment OT Short Term Goal 2 (Week 1): Pt will thread BLE into pants wiht AE PRN and supervision OT Short Term Goal 3 (Week 1): Pt will groom in standing 2/2 items to improve functional endurance OT Short Term Goal 4 (Week 1): Pt will complete clothing manamgent prior to toileting with touching A OT Short Term Goal 5 (Week 1): Pt will complete shower transfer with min A  Skilled Therapeutic Interventions/Progress Updates:    1:1. Pt ambulates to dayroom with RW with MIN A for sit to stand and balance while walking with VC for foot clearance and looking forward. Pt stands to teach OT card trick with intermittent UE support on high low table with CGA for balance and Vc for posture. Pt propels w/c back to room with BUE for general conditioning with supervision. Exited session with pt seated in w/c with call light in reach and all needs met.  Therapy Documentation Precautions:  Precautions Precautions: Fall Precaution Comments: sats dropped to 86% with activity, returned to WNL with 1-2 min rest breaks Restrictions Weight Bearing Restrictions: No General:   Vital Signs:  Pain:   ADL:   Vision   Perception    Praxis   Exercises:   Other Treatments:    See Function Navigator for Current Functional Status.   Therapy/Group: Individual Therapy   M  10/13/2017, 2:37 PM 

## 2017-10-14 ENCOUNTER — Inpatient Hospital Stay (HOSPITAL_COMMUNITY): Payer: Medicare Other | Admitting: Occupational Therapy

## 2017-10-14 ENCOUNTER — Institutional Professional Consult (permissible substitution): Payer: Medicare Other | Admitting: Neurology

## 2017-10-14 ENCOUNTER — Inpatient Hospital Stay (HOSPITAL_COMMUNITY): Payer: Medicare Other | Admitting: Physical Therapy

## 2017-10-14 LAB — GLUCOSE, CAPILLARY
Glucose-Capillary: 188 mg/dL — ABNORMAL HIGH (ref 65–99)
Glucose-Capillary: 192 mg/dL — ABNORMAL HIGH (ref 65–99)
Glucose-Capillary: 169 mg/dL — ABNORMAL HIGH (ref 65–99)
Glucose-Capillary: 178 mg/dL — ABNORMAL HIGH (ref 65–99)

## 2017-10-14 NOTE — Progress Notes (Signed)
Occupational Therapy Session Note  Patient Details  Name: Travis [name] MRN: [id number] Date of Birth: October 14, 1946  Today's Date: 10/14/2017 OT Individual Time: 1100-1200 OT Individual Time Calculation (min): 60 min    Short Term Goals: Week 1:  OT Short Term Goal 1 (Week 1): Pt will sit to stand wiht MIN A in prep for clothing managment OT Short Term Goal 2 (Week 1): Pt will thread BLE into pants wiht AE PRN and supervision OT Short Term Goal 3 (Week 1): Pt will groom in standing 2/2 items to improve functional endurance OT Short Term Goal 4 (Week 1): Pt will complete clothing manamgent prior to toileting with touching A OT Short Term Goal 5 (Week 1): Pt will complete shower transfer with min A  Skilled Therapeutic Interventions/Progress Updates:    Upon entering the room, pt seated in wheelchair requesting to shower this session. Pt required set up A to obtain all needed items. Pt ambulating with RW into bathroom with min A for transfer onto elevated toilet seat and steady assistance for balance with clothing management. Pt unable to void. Pt provided with min verbal cues to remove clothing while seated on toilet for safety awareness. Pt transferred onto TTB for bathing at shower level with use of LH sponge to wash B LE's and back. Pt required assistance to wash buttocks. Pt needing lifting assistance to stand from TTB. Pt ambulating to sit into wheelchair for dressing with use of dressing stick and increased time. Pt given standing with assistance for balance to pull clothing over B hips and fasten clothing. Pt required assistance for TED hose and B shoes but declines AE such as elastic laces or shoe buttons. Pt seated in wheelchair at end of session with call bell and all needed items within reach upon exiting the room.   Therapy Documentation Precautions:  Precautions Precautions: Fall Precaution Comments: sats dropped to 86% with activity, returned to WNL with 1-2 min rest  breaks Restrictions Weight Bearing Restrictions: No General:   Vital Signs: Therapy Vitals Pulse Rate: 62 Resp: 18 Patient Position (if appropriate): Sitting Oxygen Therapy SpO2: 92 % O2 Device: Not Delivered  See Function Navigator for Current Functional Status.   Therapy/Group: Individual Therapy  Gypsy Decant 10/14/2017, 12:43 PM

## 2017-10-14 NOTE — Progress Notes (Signed)
[place] PHYSICAL MEDICINE & REHABILITATION     PROGRESS NOTE    Subjective/Complaints:  Pt displaying increased activity tolerance with therapy. Walked around [employer] [place] yesterday. Proud of himself. Slept well  ROS: pt denies nausea, vomiting, diarrhea, cough, shortness of breath or chest pain   Objective: Vital Signs: Blood pressure (!) 159/69, pulse 62, temperature (!) 97.5 F (36.4 C), temperature source Oral, resp. rate 18, height 6\' 1"  (1.854 m), weight 129.3 kg (285 lb), SpO2 92 %. No results found. No results for input(s): WBC, HGB, HCT, PLT in the last 72 hours. No results for input(s): NA, K, CL, GLUCOSE, BUN, CREATININE, CALCIUM in the last 72 hours.  Invalid input(s): CO CBG (last 3)   Recent Labs  10/13/17 2057 10/14/17 0625 10/14/17 1205  GLUCAP 172* 188* 192*    Wt Readings from Last 3 Encounters:  10/14/17 129.3 kg (285 lb)  10/08/17 128.1 kg (282 lb 4.8 oz)  09/01/17 132.5 kg (292 lb)    Physical Exam:  Constitutional: He is oriented to person, place, and time. He appears well-developedand well-nourished. No distress.  Obese male. Up at EOB eating breafkast HENT:  Head: Normocephalicand atraumatic.  Mouth/Throat: Oropharynx is clear and moist.  Eyes: PERRL  Neck: Normal range of motion. Neck supple.  Cardiovascular: RRR without murmur. No JVD  .  Respiratory: CTA Bilaterally without wheezes or rales. Normal effort  .  GI: Soft. Bowel sounds are normal. He exhibits no distension. There is no tenderness.  Musculoskeletal: He exhibits edema(1+ edema BLE).  Stasis changes BLEunchanged    Skin: Skin is warmand dry. He is not diaphoretic.  Psychiatric: He has a normal mood and affect. His behavior is normal. Judgmentand thought contentnormal.  Neuro: Motor 4 to 4+/5 in Bilateral deltoid, biceps, triceps, grip 3-/5 bilaterally flexor, knee extensor and ankle dorsiflexor. Trace toe flexors and toe extensors---no changes Dec proprioception  bilateral great toes, intact essentially at the ankle. Intact sensation in the upper limbs, normal light touch in the upper and lower limbs  Assessment/Plan: 1. 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 Physiatrist is providing close team supervision and 24 hour management of active medical problems listed below. Physiatrist and rehab team continue to assess barriers to discharge/monitor patient progress toward functional and medical goals.  Function:  Bathing Bathing position   Position: Shower  Bathing parts Body parts bathed by patient: Right arm, Left arm, Chest, Front perineal area, Abdomen, Right upper leg, Left upper leg Body parts bathed by helper: Buttocks, Right lower leg, Left lower leg, Back  Bathing assist Assist Level:  (mod A)      Upper Body Dressing/Undressing Upper body dressing   What is the patient wearing?: Pull over shirt/dress     Pull over shirt/dress - Perfomed by patient: Thread/unthread right sleeve, Thread/unthread left sleeve, Put head through opening, Pull shirt over trunk          Upper body assist Assist Level: Set up, Supervision or verbal cues      Lower Body Dressing/Undressing Lower body dressing   What is the patient wearing?: Shoes, Ted Hose, Underwear, Pants Underwear - Performed by patient: Thread/unthread right underwear leg, Thread/unthread left underwear leg, Pull underwear up/down Underwear - Performed by helper: Thread/unthread right underwear leg, Thread/unthread left underwear leg, Pull underwear up/down Pants- Performed by patient: Thread/unthread right pants leg, Thread/unthread  left pants leg, Pull pants up/down Pants- Performed by helper: Fasten/unfasten pants           Shoes - Performed by helper: Don/doff right shoe, Don/doff left shoe, Fasten  right, Fasten left       TED Hose - Performed by helper: Don/doff right TED hose, Don/doff left TED hose  Lower body assist Assist for lower body dressing: Touching or steadying assistance (Pt > 75%), Assistive device Assistive Device Comment: Engineer, maintenance (IT)   Toileting steps completed by patient: Adjust clothing prior to toileting, Performs perineal hygiene, Adjust clothing after toileting Toileting steps completed by helper: Adjust clothing prior to toileting, Performs perineal hygiene, Adjust clothing after toileting Toileting Assistive Devices: Grab bar or rail  Toileting assist Assist level: Touching or steadying assistance (Pt.75%)   Transfers Chair/bed transfer   Chair/bed transfer method: Stand pivot Chair/bed transfer assist level: Touching or steadying assistance (Pt > 75%) Chair/bed transfer assistive device: Armrests, Medical sales representative     Max distance: 175 Assist level: Touching or steadying assistance (Pt > 75%)   Wheelchair   Type: Manual Max wheelchair distance: 150 Assist Level: Supervision or verbal cues  Cognition Comprehension Comprehension assist level: Follows complex conversation/direction with no assist  Expression Expression assist level: Expresses complex ideas: With no assist  Social Interaction Social Interaction assist level: Interacts appropriately with others - No medications needed.  Problem Solving Problem solving assist level: Solves complex problems: Recognizes & self-corrects  Memory Memory assist level: Complete Independence: No helper   Medical Problem List and Plan: 1. 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 Still struggles with transfers 2. DVT Prophylaxis/Anticoagulation: Pharmaceutical: Lovenox 3. Pain Management: [name] 4. Mood: LCSW to follow for evaluation and support.   5. Neuropsych: This patient iscapable of making decisions on hisown behalf. 6. Skin/Wound Care: Routine pressure relief measures. Maintain adequate nutritional and hydration status.  7. Fluids/Electrolytes/Nutrition: Strict I/O. Low salt diet.  -  . 8. Acute on chronic diastolic CHF: Strict I/O with daily weights to monitor for overload. ON hydralazine, losartan and lasix.   -weights slightly up over the last 3 days---continue to follow, volumes still balanced to negative Filed Weights   10/12/17 0603 10/13/17 0606 10/14/17 0615  Weight: 126.6 kg (279 lb 1.6 oz) 128.4 kg (283 lb 1.1 oz) 129.3 kg (285 lb)    9. Chronic hypoxia with new diagnosis of severe obstructive airway disease: Off oxygen now.   -activity tolerance showing improvement 10. HTN: monitor BP bid.  11. OSA: CPAP daily -- . 12.. Acute on chronic CKD: Will need follow up MRI on outpatient basis. Monitor SCr.  -Cr 1.34 10/11    -recheck Thursday morning 13. 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  14. Citrobacter UTI: completed Antibiotics  -normal emptying patterns 15. Hyperglycemia: change to CM diet. SSI      LOS (Days) 6 A FACE TO FACE EVALUATION WAS PERFORMED  Meredith Staggers, MD 10/14/2017 12:31 PM

## 2017-10-14 NOTE — Progress Notes (Signed)
Physical Therapy Weekly Progress Note  Patient Details  Name: GEOFFRY BANNISTER MRN: [id number] Date of Birth: Jun 21, 1946  Beginning of progress report period: October 07, 2017 End of progress report period: October 14, 2017  Today's Date: 10/14/2017 PT Individual Time: 0800-0930 and 1400-1430 PT Individual Time Calculation (min): 90 min and 30 min (total 120 min)   Patient has met 4 of 4 short term goals. Pt demonstrates bed mobility on flat bed with S and increased time, variable min>modA sit <>stand depending on height of sitting surface and fatigue. Pt ambulating 150' with RW and min guard, however significantly slow gait speed (0.2 m/sec) indicative of high risk for falls and hospitalization. Patient limited by chronicity of disease process with longterm changes to muscle length, strength and coordination. Pt is hard working and motivated to improve independence and reduce caregiver burden. Education ongoing regarding importance of establishing consistent HEP and increased daily activity upon return home.   Patient continues to demonstrate the following deficits muscle weakness and muscle joint tightness, decreased cardiorespiratoy endurance, impaired timing and sequencing, abnormal tone, unbalanced muscle activation and decreased coordination and decreased sitting balance, decreased standing balance, decreased postural control and decreased balance strategies and therefore will continue to benefit from skilled PT intervention to increase functional independence with mobility.  Patient progressing toward long term goals..  Continue plan of care.  PT Short Term Goals Week 1:  PT Short Term Goal 1 (Week 1): Pt will perform bed mobility with min guard PT Short Term Goal 1 - Progress (Week 1): Met PT Short Term Goal 2 (Week 1): Pt will perform sit <>stand consistent minA PT Short Term Goal 2 - Progress (Week 1): Met PT Short Term Goal 3 (Week 1): Pt will perform gait x150' with minA PT Short  Term Goal 3 - Progress (Week 1): Met PT Short Term Goal 4 (Week 1): Pt will perform car transfer minA PT Short Term Goal 4 - Progress (Week 1): Met Week 2:  PT Short Term Goal 1 (Week 2): =LTG due to estimated LOS  Skilled Therapeutic Interventions/Progress Updates: Tx 1: Pt received seated in w/c, denies pain and agreeable to treatment. TEDs and shoes donned totalA. Discussed with pt use of elastic laces to increase independence with ADLs; pt reports he has some on the shoes that also have the toe cap, and plans to have wife bring them in. W/c propulsion to gym with BUE for strengthening and endurance. Standing gastroc stretch x2 min on wedge. Standing hip flexor/gastroc lunge stretch x1 min each side; pt demo's appropriate form and safety with stretch, cues for abdominal draw-in to reduce stress on low back and exaggerate stretch on hip flexors. Standing heel raises 2x15, attempted toe raises for dorsiflexor activation however strength insufficient for activation against gravity in standing. Performed 2 sets 15 reps toe raises in sitting, cues for 2-3 sec hold for activation.Third set performed with level 1 theraband for resistance. Hip flexor marching 2x15 reps, standing hip abduction 2x10 each side. Backwards walking 4x10' with min guard for glute activation, hip flexor/gastroc/soleus stretching, cues for upright posture. Side stepping R/L with UE support 4x10'. Standing side lunge adductor stretch 5x10 sec each side. Progressive balance exercises normal BOS eyes open/closed, narrow BOS eyes open, staggered stance eye open; requires assist from hands to regain LOBs, increased LOBs as BOS narrowed and with eyes closed. Gait to ADL apartment min guard; cues for increased hip flexion and foot clearance. Sit <>supine in flat bed with S and significantly  increased time d/t difficulty managing LEs. Gait to return to room x175' with min guard. Gait speed assessed at 0.2 m/sec indicative of high falls risk, risk of  rehospitalization, and pt categorized as household ambulator, non-functional for community mobility. Pt remained seated in w/c at end of session, all needs in reach.   Tx 2: Pt received seated in w/c with brother present; denies pain and agreeable to treatment. Sit >stand minA with RW. Gait to day room including over carpeted surfaces with min guard, repetitive cueing for increased step length and foot clearance on L side > R side. Performed nustep level 5 with BUE/BLE average 70 steps/min for strengthening and aerobic endurance. Performed biodex catch game on static platform with one UE and no UE support for focus on weight shifting and ankle strategy. Remained seated in w/c at end of session in day room; OT alerted to pt position for next session.      Therapy Documentation Precautions:  Precautions Precautions: Fall Precaution Comments: sats dropped to 86% with activity, returned to WNL with 1-2 min rest breaks Restrictions Weight Bearing Restrictions: No Pain: Pain Assessment Pain Assessment: No/denies pain Pain Score: 0-No pain   See Function Navigator for Current Functional Status.  Therapy/Group: Individual Therapy  Luberta Mutter 10/14/2017, 9:40 AM

## 2017-10-14 NOTE — Progress Notes (Signed)
Occupational Therapy Session Note  Patient Details  Name: Travis [name] MRN: [id number] Date of Birth: 03-16-1946  Today's Date: 10/14/2017 OT Individual Time: 1430-1500 OT Individual Time Calculation (min): 30 min    Short Term Goals: Week 1:  OT Short Term Goal 1 (Week 1): Pt will sit to stand wiht MIN A in prep for clothing managment OT Short Term Goal 2 (Week 1): Pt will thread BLE into pants wiht AE PRN and supervision OT Short Term Goal 3 (Week 1): Pt will groom in standing 2/2 items to improve functional endurance OT Short Term Goal 4 (Week 1): Pt will complete clothing manamgent prior to toileting with touching A OT Short Term Goal 5 (Week 1): Pt will complete shower transfer with min A Week 2:     Skilled Therapeutic Interventions/Progress Updates:    1:1 continued focus on standing tolerance with out Ue support at raised table with table top activity. Pt able to tolerate standing for ~25 min. After a seated rest break - pt able to ambulate back to his room with min A with RW.  Pt does continue to present with left foot drag often with ambulation. Left up in w/c visiting with pastor.  Therapy Documentation Precautions:  Precautions Precautions: Fall Precaution Comments: sats dropped to 86% with activity, returned to WNL with 1-2 min rest breaks Restrictions Weight Bearing Restrictions: No Pain:  no c/o pain  Other Treatments:    See Function Navigator for Current Functional Status.   Therapy/Group: Individual Therapy  Willeen Cass [facility] 10/14/2017, 3:24 PM

## 2017-10-15 ENCOUNTER — Inpatient Hospital Stay (HOSPITAL_COMMUNITY): Payer: Medicare Other

## 2017-10-15 ENCOUNTER — Inpatient Hospital Stay (HOSPITAL_COMMUNITY): Payer: Medicare Other | Admitting: Occupational Therapy

## 2017-10-15 LAB — BASIC METABOLIC PANEL
Anion gap: 9 (ref 5–15)
BUN: 26 mg/dL — AB (ref 6–20)
CALCIUM: 8.9 mg/dL (ref 8.9–10.3)
CO2: 25 mmol/L (ref 22–32)
Chloride: 103 mmol/L (ref 101–111)
Creatinine, Ser: 1.21 mg/dL (ref 0.61–1.24)
GFR calc Af Amer: >60 mL/min (ref >60)
GFR, EST NON AFRICAN AMERICAN: 58 mL/min — AB (ref >60)
GLUCOSE: 182 mg/dL — AB (ref 65–99)
POTASSIUM: 4.3 mmol/L (ref 3.5–5.1)
SODIUM: 137 mmol/L (ref 135–145)

## 2017-10-15 LAB — CBC
HEMATOCRIT: 35.1 % — AB (ref 39.0–52.0)
HEMOGLOBIN: 11.2 g/dL — AB (ref 13.0–17.0)
MCH: 29.6 pg (ref 26.0–34.0)
MCHC: 31.9 g/dL (ref 30.0–36.0)
MCV: 92.9 fL (ref 78.0–100.0)
Platelets: 216 10*3/uL (ref 150–400)
RBC: 3.78 MIL/uL — ABNORMAL LOW (ref 4.22–5.81)
RDW: 16.1 % — ABNORMAL HIGH (ref 11.5–15.5)
WBC: 5.2 10*3/uL (ref 4.0–10.5)

## 2017-10-15 LAB — GLUCOSE, CAPILLARY
GLUCOSE-CAPILLARY: 209 mg/dL — AB (ref 65–99)
Glucose-Capillary: 173 mg/dL — ABNORMAL HIGH (ref 65–99)
Glucose-Capillary: 179 mg/dL — ABNORMAL HIGH (ref 65–99)
Glucose-Capillary: 148 mg/dL — ABNORMAL HIGH (ref 65–99)

## 2017-10-15 NOTE — Progress Notes (Signed)
Physical Therapy Session Note  Patient Details  Name: Travis [name] MRN: [id number] Date of Birth: February 03, 1946  Today's Date: 10/15/2017 PT Individual Time: 9470-9628 PT Individual Time Calculation (min): 58 min   Short Term Goals: Week 2:  PT Short Term Goal 1 (Week 2): =LTG due to estimated LOS  Skilled Therapeutic Interventions/Progress Updates:   Functional gait on unit with RW (with focus and cues on LLE clearance (improved at end of session after stretching and NMR activities) at overall min assist and cues for upright posture and positioning of RW. Standing gastroc stretch x2 min x 2 reps on wedge. Standing hip flexor/gastroc lunge stretch x1 min each side x 2 reps each side with UE support on parallel bars. NMR in parallel bars for lateral stepping with focus of increasing step length and hip musculature activation adding mini squat with cues for technique; retro gait and tandem gait with focus on decreasing reliance on UE support and focus on coordination; and rocker board to increase ankle strategies and trunk/hip activation.   Therapy Documentation Precautions:  Precautions Precautions: Fall Precaution Comments: sats dropped to 86% with activity, returned to WNL with 1-2 min rest breaks Restrictions Weight Bearing Restrictions: No   Pain:  Denies pain.    See Function Navigator for Current Functional Status.   Therapy/Group: Individual Therapy  Canary Brim Ivory Broad, PT, DPT  10/15/2017, 10:02 AM

## 2017-10-15 NOTE — Progress Notes (Signed)
Occupational Therapy Session Note  Patient Details  Name: Travis [name] MRN: [id number] Date of Birth: [birth year]  Today's Date: 10/15/2017 OT Individual Time: 3300-7622 and 1400-1500 OT Individual Time Calculation (min): 72 min and 60 min    Short Term Goals: Week 1:  OT Short Term Goal 1 (Week 1): Pt will sit to stand wiht MIN A in prep for clothing managment OT Short Term Goal 2 (Week 1): Pt will thread BLE into pants wiht AE PRN and supervision OT Short Term Goal 3 (Week 1): Pt will groom in standing 2/2 items to improve functional endurance OT Short Term Goal 4 (Week 1): Pt will complete clothing manamgent prior to toileting with touching A OT Short Term Goal 5 (Week 1): Pt will complete shower transfer with min A  Skilled Therapeutic Interventions/Progress Updates:   Session 1: Upon entering the room, pt seated in wheelchair finishing breakfast tray and requesting to use the bathroom. Pt standing from wheelchair with mod A for sit >stand with use of RW. Pt ambulating into bathroom for transfer onto elevated toilet seat with min A. Pt unable to have BM this session. Pt removing clothing while seated on toilet for energy conservation.Pt transferred onto shower chair with min A for bathing at shower level with use of LH sponge to increase independence for LB self care. Pt drying while seated and ambulating to wheelchair for dressing. Pt performed LB dressing with dressing stick to increase I and min A for balance during LB clothing management. Pt seated in wheelchair at sink for grooming tasks. Call bell and all needs within reach upon exiting the room.   Session 2: Upon entering the room, pt and therapist discussed purchase of bariatric drop arm vs bariatric BSC toilet for home use. OT will print off information for pt's wife at next session for reference. Pt requesting to go outside. Pt  propelling wheelchair with B UE's outside with intermittent assistance from therapist secondary to  fatigue with overall supervision. Pt taking rest break and therapist discussed pt progress towards OT goals this week. Pt verbalizing his goals for the next week towards discharge. Pt ambulating 180' with RW and min A towards room at end of session. Pt returned to room via wheelchair and remained with call bell and all needed items within reach upon exiting the room.   Therapy Documentation Precautions:  Precautions Precautions: Fall Precaution Comments: sats dropped to 86% with activity, returned to WNL with 1-2 min rest breaks Restrictions Weight Bearing Restrictions: No  See Function Navigator for Current Functional Status.   Therapy/Group: Individual Therapy  Gypsy Decant 10/15/2017, 12:34 PM

## 2017-10-15 NOTE — Progress Notes (Signed)
PHYSICAL MEDICINE & REHABILITATION     PROGRESS NOTE    Subjective/Complaints:  Up walking with PT. Feeling well. Was concerned about bp elevation. Edema about the same. Breathing better  ROS: pt denies nausea, vomiting, diarrhea, cough, shortness of breath or chest pain   Objective: Vital Signs: Blood pressure (!) 159/72, pulse 62, temperature 97.9 F (36.6 C), temperature source Oral, resp. rate 19, height 6\' 1"  (1.854 m), weight 94.5 kg (208 lb 5 oz), SpO2 92 %. No results found.  Recent Labs  10/15/17 0552  WBC 5.2  HGB 11.2*  HCT 35.1*  PLT 216    Recent Labs  10/15/17 0552  NA 137  K 4.3  CL 103  GLUCOSE 182*  BUN 26*  CREATININE 1.21  CALCIUM 8.9   CBG (last 3)   Recent Labs  10/14/17 1645 10/14/17 2043 10/15/17 0652  GLUCAP 169* 178* 173*    Wt Readings from Last 3 Encounters:  10/15/17 94.5 kg (208 lb 5 oz)  10/08/17 128.1 kg (282 lb 4.8 oz)  09/01/17 132.5 kg (292 lb)    Physical Exam:  Constitutional: He is oriented to person, place, and time. He appears well-developedand well-nourished. No distress.  Obese male. Up at EOB eating breafkast HENT:  Head: Normocephalicand atraumatic.  Mouth/Throat: Oropharynx is clear and moist.  Eyes: PERRL  Neck: Normal range of motion. Neck supple.  Cardiovascular: RRR without murmur. No JVD   .  Respiratory: CTA Bilaterally without wheezes or rales. Normal effort  GI: Soft. Bowel sounds are normal. He exhibits no distension. There is no tenderness.  Musculoskeletal: (persistent 1+ edema BLE).  Stasis changes BLEunchanged    Skin: Skin is warmand dry. He is not diaphoretic.  Psychiatric: He has a normal mood and affect. His behavior is normal. Judgmentand thought contentnormal.  Neuro: Motor 4 to 4+/5 in Bilateral deltoid, biceps, triceps, grip 3/5 bilaterally flexor, knee extensor and ankle dorsiflexor. Trace to 1/5 toe flexors and toe extensors---no changes Dec proprioception  bilateral great toes, intact essentially at the ankle. Intact sensation in the upper limbs, normal light touch in the upper and lower limbs  Assessment/Plan: 1. 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 Physiatrist is providing close team supervision and 24 hour management of active medical problems listed below. Physiatrist and rehab team continue to assess barriers to discharge/monitor patient progress toward functional and medical goals.  Function:  Bathing Bathing position   Position: Shower  Bathing parts Body parts bathed by patient: Right arm, Left arm, Chest, Front perineal area, Abdomen, Right upper leg, Left upper leg, Right lower leg, Left lower leg, Back Body parts bathed by helper: Buttocks  Bathing assist Assist Level: Touching or steadying assistance(Pt > 75%)      Upper Body Dressing/Undressing Upper body dressing   What is the patient wearing?: Pull over shirt/dress     Pull over shirt/dress - Perfomed by patient: Thread/unthread right sleeve, Thread/unthread left sleeve, Put head through opening, Pull shirt over trunk          Upper body assist Assist Level: Set up, Supervision or verbal cues   Set up : To obtain clothing/put away  Lower Body Dressing/Undressing Lower body dressing   What is the patient wearing?: Shoes, Ted Hose, Underwear, Pants Underwear - Performed by patient: Thread/unthread right underwear leg, Thread/unthread left underwear leg, Pull  underwear up/down Underwear - Performed by helper: Thread/unthread right underwear leg, Thread/unthread left underwear leg, Pull underwear up/down Pants- Performed by patient: Thread/unthread right pants leg, Thread/unthread left pants leg, Pull pants up/down Pants- Performed by helper: Fasten/unfasten pants           Shoes -  Performed by helper: Don/doff right shoe, Don/doff left shoe, Fasten right, Fasten left       TED Hose - Performed by helper: Don/doff right TED hose, Don/doff left TED hose  Lower body assist Assist for lower body dressing: Touching or steadying assistance (Pt > 75%), Assistive device Assistive Device Comment: dressing stick    Toileting Toileting   Toileting steps completed by patient: Adjust clothing prior to toileting, Performs perineal hygiene, Adjust clothing after toileting Toileting steps completed by helper: Adjust clothing prior to toileting, Performs perineal hygiene, Adjust clothing after toileting Toileting Assistive Devices: Grab bar or rail  Toileting assist Assist level: Touching or steadying assistance (Pt.75%)   Transfers Chair/bed transfer   Chair/bed transfer method: Stand pivot Chair/bed transfer assist level: Touching or steadying assistance (Pt > 75%) Chair/bed transfer assistive device: Armrests, Medical sales representative     Max distance: 175 Assist level: Touching or steadying assistance (Pt > 75%)   Wheelchair   Type: Manual Max wheelchair distance: 150 Assist Level: Supervision or verbal cues  Cognition Comprehension Comprehension assist level: Follows complex conversation/direction with no assist  Expression Expression assist level: Expresses complex ideas: With no assist  Social Interaction Social Interaction assist level: Interacts appropriately with others - No medications needed.  Problem Solving Problem solving assist level: Solves complex problems: Recognizes & self-corrects  Memory Memory assist level: Complete Independence: No helper   Medical Problem List and Plan: 1. 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 DVT Prophylaxis/Anticoagulation: Pharmaceutical: Lovenox 3. Pain  Management: [name] 4. Mood: LCSW to follow for evaluation and support.  5. Neuropsych: This patient iscapable of making decisions on hisown behalf. 6. Skin/Wound Care: Routine pressure relief measures. Maintain adequate nutritional and hydration status.  7. Fluids/Electrolytes/Nutrition: Strict I/O. Low salt diet.  -  . 8. Acute on chronic diastolic CHF: Strict I/O with daily weights to monitor for overload. ON hydralazine, losartan and lasix.   -weights slightly up over the last 3 days--today's weigh inaccurate   -consider addnl dose of lasix after weight re-checked Filed Weights   10/13/17 0606 10/14/17 0615 10/15/17 0500  Weight: 128.4 kg (283 lb 1.1 oz) 129.3 kg (285 lb) 94.5 kg (208 lb 5 oz)    9. Chronic hypoxia with new diagnosis of severe obstructive airway disease: Off oxygen now.   -activity tolerance showing improvement 10. HTN: monitor BP bid.  11. OSA: CPAP daily -- . 12.. Acute on chronic CKD: Will need follow up MRI on outpatient basis. Monitor SCr.  -Cr 1.21 today. BUN decreased 13. 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  14. Citrobacter UTI: completed Antibiotics  -normal emptying patterns 15. Hyperglycemia: changed to CM diet. SSI  -dietary ed      LOS (Days) 7 A FACE TO FACE EVALUATION WAS PERFORMED  Meredith Staggers, MD 10/15/2017 9:54 AM

## 2017-10-15 NOTE — Progress Notes (Signed)
Pt self administers home CPAP. Rt added water of pts own jug to machine per pt and set up machine close to patient to reach. Advised pt to call if he needed anything else.

## 2017-10-16 ENCOUNTER — Inpatient Hospital Stay (HOSPITAL_COMMUNITY): Payer: Medicare Other

## 2017-10-16 LAB — GLUCOSE, CAPILLARY
GLUCOSE-CAPILLARY: 137 mg/dL — AB (ref 65–99)
GLUCOSE-CAPILLARY: 200 mg/dL — AB (ref 65–99)
Glucose-Capillary: 205 mg/dL — ABNORMAL HIGH (ref 65–99)
Glucose-Capillary: 156 mg/dL — ABNORMAL HIGH (ref 65–99)

## 2017-10-16 MED ORDER — FUROSEMIDE 20 MG PO TABS
20.0000 mg | ORAL_TABLET | Freq: Every day | ORAL | Status: AC
  Administered 2017-10-16: 20 mg via ORAL
  Administered 2017-10-17: 40 mg via ORAL
  Administered 2017-10-18: 20 mg via ORAL
  Filled 2017-10-16 (×2): qty 1

## 2017-10-16 NOTE — Progress Notes (Signed)
Physical Therapy Session Note  Patient Details  Name: Travis [name] MRN: [id number] Date of Birth: [birth year]  Today's Date: 10/16/2017 PT Individual Time: 0850-0955 PT Individual Time Calculation (min): 65 min   Short Term Goals: Week 2:  PT Short Term Goal 1 (Week 2): =LTG due to estimated LOS  Skilled Therapeutic Interventions/Progress Updates:    Steadying assist for faciliation for sit <> stand and supervision gait with RW to/from therapy gym with cues for L heel strike to increase clearance. Engaged in standing gastroc/soleus stretching on wedge to increase ROM x 2 min hold x 2 reps. Standing hip flexor/gastroc lunge stretch x1 min each side x 2 reps each side with UE support on parallel bars. Stair negotiation training for functional strengthening, endurance, and coordination x 8 (3" steps) and then x 4 (6") steps with min assist overall for stability and eccentric control while descending. Seated knee flexion/quad stretch using pillow case under shoe x 20 reps each side x 2 reps with 5 second hold. NMR with heel/toe raises for balance strategy re-training and during pelvic tilts to focus on anterior weightshift and pelvic translation for carryover to sit < > stands. Handouts given for all HEP and reviewed with patient.   Therapy Documentation Precautions:  Precautions Precautions: Fall Precaution Comments: sats dropped to 86% with activity, returned to WNL with 1-2 min rest breaks Restrictions Weight Bearing Restrictions: No Pain: Pain Assessment Pain Assessment: No/denies pain   See Function Navigator for Current Functional Status.   Therapy/Group: Individual Therapy  Canary Brim Ivory Broad, PT, DPT  10/16/2017, 9:58 AM

## 2017-10-16 NOTE — Progress Notes (Signed)
[facility] PHYSICAL MEDICINE & REHABILITATION     PROGRESS NOTE    Subjective/Complaints:  Up walking with PT. Feeling well. Was concerned about bp elevation. Edema about the same. Breathing better  ROS: pt denies nausea, vomiting, diarrhea, cough, shortness of breath or chest pain   Objective: Vital Signs: Blood pressure 128/63, pulse 64, temperature 97.8 F (36.6 C), temperature source Oral, resp. rate 18, height 6\' 1"  (1.854 m), weight 129.3 kg (285 lb 1.6 oz), SpO2 95 %. No results found.  Recent Labs  10/15/17 0552  WBC 5.2  HGB 11.2*  HCT 35.1*  PLT 216    Recent Labs  10/15/17 0552  NA 137  K 4.3  CL 103  GLUCOSE 182*  BUN 26*  CREATININE 1.21  CALCIUM 8.9   CBG (last 3)   Recent Labs  10/15/17 1648 10/15/17 2146 10/16/17 0645  GLUCAP 209* 148* 205*    Wt Readings from Last 3 Encounters:  10/16/17 129.3 kg (285 lb 1.6 oz)  10/08/17 128.1 kg (282 lb 4.8 oz)  09/01/17 132.5 kg (292 lb)    Physical Exam:  Constitutional: He is oriented to person, place, and time. He appears well-developedand well-nourished. No distress.  Obese male. Up at EOB eating breafkast HENT:  Head: Normocephalicand atraumatic.  Mouth/Throat: Oropharynx is clear and moist.  Eyes: PERRL  Neck: Normal range of motion. Neck supple.  Cardiovascular: RRR without murmur. No JVD   .  Respiratory: CTA Bilaterally without wheezes or rales. Normal effort  GI: Soft. Bowel sounds are normal. He exhibits no distension. There is no tenderness.  Musculoskeletal: (persistent 1+ edema BLE).  Stasis changes BLEunchanged    Skin: Skin is warmand dry. He is not diaphoretic.  Psychiatric: He has a normal mood and affect. His behavior is normal. Judgmentand thought contentnormal.  Neuro: Motor 4 to 4+/5 in Bilateral deltoid, biceps, triceps, grip 3/5 bilaterally flexor, knee extensor and ankle dorsiflexor. Trace to 1/5 toe flexors and toe extensors---no changes Dec proprioception  bilateral great toes, intact essentially at the ankle. Intact sensation in the upper limbs, normal light touch in the upper and lower limbs  Assessment/Plan: 1. 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 Physiatrist is providing close team supervision and 24 hour management of active medical problems listed below. Physiatrist and rehab team continue to assess barriers to discharge/monitor patient progress toward functional and medical goals.  Function:  Bathing Bathing position   Position: Shower  Bathing parts Body parts bathed by patient: Right arm, Left arm, Chest, Front perineal area, Abdomen, Right upper leg, Left upper leg, Right lower leg, Left lower leg, Back Body parts bathed by helper: Buttocks  Bathing assist Assist Level: Touching or steadying assistance(Pt > 75%)      Upper Body Dressing/Undressing Upper body dressing   What is the patient wearing?: Pull over shirt/dress     Pull over shirt/dress - Perfomed by patient: Thread/unthread right sleeve, Thread/unthread left sleeve, Put head through opening, Pull shirt over trunk          Upper body assist Assist Level: Set up, Supervision or verbal cues   Set up : To obtain clothing/put away  Lower Body Dressing/Undressing Lower body dressing   What is the patient wearing?: Shoes, Ted Hose, Underwear, Pants Underwear - Performed by patient: Thread/unthread right underwear leg, Thread/unthread left underwear leg, Pull underwear  up/down Underwear - Performed by helper: Thread/unthread right underwear leg, Thread/unthread left underwear leg, Pull underwear up/down Pants- Performed by patient: Thread/unthread right pants leg, Thread/unthread left pants leg, Pull pants up/down Pants- Performed by helper: Fasten/unfasten pants           Shoes -  Performed by helper: Don/doff right shoe, Don/doff left shoe, Fasten right, Fasten left       TED Hose - Performed by helper: Don/doff right TED hose, Don/doff left TED hose  Lower body assist Assist for lower body dressing: Touching or steadying assistance (Pt > 75%), Assistive device Assistive Device Comment: dressing stick    Toileting Toileting   Toileting steps completed by patient: Adjust clothing prior to toileting, Performs perineal hygiene, Adjust clothing after toileting Toileting steps completed by helper: Adjust clothing prior to toileting, Performs perineal hygiene, Adjust clothing after toileting Toileting Assistive Devices: Grab bar or rail  Toileting assist Assist level: Touching or steadying assistance (Pt.75%)   Transfers Chair/bed transfer   Chair/bed transfer method: Ambulatory Chair/bed transfer assist level: Touching or steadying assistance (Pt > 75%) Chair/bed transfer assistive device: Armrests, Medical sales representative     Max distance: 180' Assist level: Touching or steadying assistance (Pt > 75%)   Wheelchair   Type: Manual Max wheelchair distance: 150 Assist Level: Supervision or verbal cues  Cognition Comprehension Comprehension assist level: Follows complex conversation/direction with no assist  Expression Expression assist level: Expresses complex ideas: With no assist  Social Interaction Social Interaction assist level: Interacts appropriately with others - No medications needed.  Problem Solving Problem solving assist level: Solves complex problems: Recognizes & self-corrects  Memory Memory assist level: Complete Independence: No helper   Medical Problem List and Plan: 1. 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 DVT Prophylaxis/Anticoagulation: Pharmaceutical: Lovenox 3. Pain Management: [name] 4. Mood: LCSW to  follow for evaluation and support.  5. Neuropsych: This patient iscapable of making decisions on hisown behalf. 6. Skin/Wound Care: Routine pressure relief measures. Maintain adequate nutritional and hydration status.  7. Fluids/Electrolytes/Nutrition: Strict I/O. Low salt diet.  -  . 8. Acute on chronic diastolic CHF: Strict I/O with daily weights to monitor for overload. ON hydralazine, losartan and lasix.   -weights slightly up from last week.    -will give addnl   lasix over next 3 days Filed Weights   10/15/17 0500 10/15/17 1000 10/16/17 0620  Weight: 94.5 kg (208 lb 5 oz) 129.9 kg (286 lb 4.8 oz) 129.3 kg (285 lb 1.6 oz)    9. Chronic hypoxia with new diagnosis of severe obstructive airway disease: Off oxygen now.   -activity tolerance showing improvement 10. HTN: monitor BP bid.  11. OSA: CPAP daily -- .  12.. Acute on chronic CKD: Will need follow up MRI on outpatient basis. Monitor SCr.  -Cr 1.21 10/18. BUN decreased 13. 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  14. Citrobacter UTI: completed Antibiotics  -normal emptying patterns 15. Hyperglycemia: changed to CM diet. SSI  -dietary ed      LOS (Days) 8 A FACE TO FACE EVALUATION WAS PERFORMED  Meredith Staggers, MD 10/16/2017 9:13 AM

## 2017-10-16 NOTE — Progress Notes (Signed)
Occupational Therapy Session Note  Patient Details  Name: Travis [name] MRN: [id number] Date of Birth: [birth year]  Today's Date: 10/16/2017 OT Individual Time: 1000-1058 OT Individual Time Calculation (min): 58 min    Short Term Goals: Week 1:  OT Short Term Goal 1 (Week 1): Pt will sit to stand wiht MIN A in prep for clothing managment OT Short Term Goal 2 (Week 1): Pt will thread BLE into pants wiht AE PRN and supervision OT Short Term Goal 3 (Week 1): Pt will groom in standing 2/2 items to improve functional endurance OT Short Term Goal 4 (Week 1): Pt will complete clothing manamgent prior to toileting with touching A OT Short Term Goal 5 (Week 1): Pt will complete shower transfer with min A  Skilled Therapeutic Interventions/Progress Updates:    1:1. Focus of session on sit to stand, standing balance, BUE strength, and ambulation with RW. Pt completes 2x7 sit to stand to throw horse shoes at target with RW. First round sit to stands from 21" mat height second round 19.5" mat height. Each round sit to stands needing lifting assistance fading to supervision with VC for posture, hip extension, hand placement, "nose over toes" and foot placement. To improve endurance, standing balance and UE strength required for BADLs. Pt complete 3x30 ball passes in standing without foam pad at CGA and MIN A while standing on foam pad for last round. Pt ambulates back to room with supervision and VC for foot clearance, decreasing reliance of UE on walker and looking forward. Exited session with pt seated in w/c with call light in reach and all needs met. Therapy Documentation Precautions:  Precautions Precautions: Fall Precaution Comments: sats dropped to 86% with activity, returned to WNL with 1-2 min rest breaks Restrictions Weight Bearing Restrictions: No  See Function Navigator for Current Functional Status.   Therapy/Group: Individual Therapy  Tonny Branch 10/16/2017, 11:00 AM

## 2017-10-16 NOTE — Progress Notes (Signed)
Pt self administers CPAP. No complications noted.

## 2017-10-16 NOTE — Progress Notes (Addendum)
Occupational Therapy Session Note  Patient Details  Name: Travis [name] MRN: [id number] Date of Birth: 25-Dec-1946  Today's Date: 10/16/2017 OT Individual Time: 8469-6295 OT Individual Time Calculation (min): 71 min    Short Term Goals: Week 1:  OT Short Term Goal 1 (Week 1): Pt will sit to stand wiht MIN A in prep for clothing managment OT Short Term Goal 2 (Week 1): Pt will thread BLE into pants wiht AE PRN and supervision OT Short Term Goal 3 (Week 1): Pt will groom in standing 2/2 items to improve functional endurance OT Short Term Goal 4 (Week 1): Pt will complete clothing manamgent prior to toileting with touching A OT Short Term Goal 5 (Week 1): Pt will complete shower transfer with min A  Skilled Therapeutic Interventions/Progress Updates:    1:1. Pt eating seated in w/c upon arrival set up with breakfast tray with MOD I discussing ELOS and goals while eating. Pt reporting disappointed not going home sooner but reported understanding needing to improve function before safely returning home. Pt completes sit to stand with steadying A and VC for foot placement. Pt able to elevate buttocks off seat using arms and move feet under seat with 2 steps but requires manual facilitation of lean forward. Pt ambulates to dresser to retrieve clothing with supervision. Pt ambulatory transfer to TTB with supervision without VC for RW management this date. Pt attempts to doff shorts in standing by standing on 1 leg with BUE support on walker/grab bar. Educated pt on doffing shorts over feet in sitting to decrease fall risk. Pt bathes at sit to stand level with A to wash buttocks using LHSS to wash back and B feet. Pt dresses at sit to stand level in w/c with RW with set up for UB clothing and MIN A for standing balance for pants. Pt utilizing dressing stick to thread pants over feet. OT provides total A for teds and shoes as pt reports wife assists with this at home. Pt stands with MIN A to brush teeth  and comb hair at sink with VC to put more weight through BLEs. Pt ambulates in hallway with supervision iwht VC for foot clearance and looking forward. Exited session with pt seated in w/c with call light in reach and all needs met.   Therapy Documentation Precautions:  Precautions Precautions: Fall Precaution Comments: sats dropped to 86% with activity, returned to WNL with 1-2 min rest breaks Restrictions Weight Bearing Restrictions: No  See Function Navigator for Current Functional Status.   Therapy/Group: Individual Therapy  Tonny Branch 10/16/2017, 7:19 AM

## 2017-10-16 NOTE — Progress Notes (Signed)
Occupational Therapy Weekly Progress Note  Patient Details  Name: Travis [name] MRN: [id number] Date of Birth: May 15, 1946  Beginning of progress report period: October 09, 2017 End of progress report period: October 16, 2017  Patient has met 5 of 5 short term goals. Pt has made good progress this week in OT improving BADLs to requiring supervision- touching A. Pt improved functional transfers from requiring lifting assistance to sit to stand from w/c, BSC or TTB level to touching/steadying A. Pt able to thread BLE into pants with use of AE, however still requires A to don footwear as pt reports wife does this at home. Pt able to complete 3 sit to stands from 20" mat with RW with CGA-supervision. Pt continues to demonstrate difficulty positioning feet prior to sit to stand impacting consistency of this transitional movement. Pt demo improved endurance being able to stands to complete grooming tasks and ambulate to/from tx spaces.   Patient continues to demonstrate the following deficits: muscle weakness, decreased cardiorespiratoy endurance and decreased standing balance and decreased balance strategies and therefore will continue to benefit from skilled OT intervention to enhance overall performance with BADL and iADL.  Patient progressing toward long term goals..  Continue plan of care.  OT Short Term Goals Week 1:  OT Short Term Goal 1 (Week 1): Pt will sit to stand wiht MIN A in prep for clothing managment OT Short Term Goal 1 - Progress (Week 1): Met OT Short Term Goal 2 (Week 1): Pt will thread BLE into pants wiht AE PRN and supervision OT Short Term Goal 2 - Progress (Week 1): Met OT Short Term Goal 3 (Week 1): Pt will groom in standing 2/2 items to improve functional endurance OT Short Term Goal 3 - Progress (Week 1): Met OT Short Term Goal 4 (Week 1): Pt will complete clothing manamgent prior to toileting with touching A OT Short Term Goal 4 - Progress (Week 1): Met OT Short Term  Goal 5 (Week 1): Pt will complete shower transfer with min A OT Short Term Goal 5 - Progress (Week 1): Met Week 2:  OT Short Term Goal 1 (Week 2): STG=LTGs 2/2 ELOS  Skilled Therapeutic Interventions/Progress Updates:      Therapy Documentation Precautions:  Precautions Precautions: Fall Precaution Comments: sats dropped to 86% with activity, returned to WNL with 1-2 min rest breaks Restrictions Weight Bearing Restrictions: No  See Function Navigator for Current Functional Status.   Therapy/Group: Individual Therapy  Tonny Branch 10/16/2017, 4:29 PM

## 2017-10-17 ENCOUNTER — Inpatient Hospital Stay (HOSPITAL_COMMUNITY): Payer: Medicare Other

## 2017-10-17 DIAGNOSIS — E1169 Type 2 diabetes mellitus with other specified complication: Secondary | ICD-10-CM

## 2017-10-17 DIAGNOSIS — M4807 Spinal stenosis, lumbosacral region: Secondary | ICD-10-CM

## 2017-10-17 DIAGNOSIS — D62 Acute posthemorrhagic anemia: Secondary | ICD-10-CM

## 2017-10-17 DIAGNOSIS — N183 Chronic kidney disease, stage 3 (moderate): Secondary | ICD-10-CM

## 2017-10-17 DIAGNOSIS — G63 Polyneuropathy in diseases classified elsewhere: Secondary | ICD-10-CM

## 2017-10-17 DIAGNOSIS — E669 Obesity, unspecified: Secondary | ICD-10-CM

## 2017-10-17 LAB — GLUCOSE, CAPILLARY
GLUCOSE-CAPILLARY: 152 mg/dL — AB (ref 65–99)
Glucose-Capillary: 194 mg/dL — ABNORMAL HIGH (ref 65–99)
Glucose-Capillary: 148 mg/dL — ABNORMAL HIGH (ref 65–99)

## 2017-10-17 MED ORDER — HYDRALAZINE HCL 50 MG PO TABS
50.0000 mg | ORAL_TABLET | Freq: Three times a day (TID) | ORAL | Status: DC
  Administered 2017-10-18 – 2017-10-20 (×7): 50 mg via ORAL
  Filled 2017-10-17 (×7): qty 1

## 2017-10-17 MED ORDER — GLIMEPIRIDE 2 MG PO TABS
1.0000 mg | ORAL_TABLET | Freq: Every day | ORAL | Status: DC
  Administered 2017-10-18 – 2017-10-20 (×3): 1 mg via ORAL
  Filled 2017-10-17 (×3): qty 1

## 2017-10-17 NOTE — Progress Notes (Signed)
[facility] PHYSICAL MEDICINE & REHABILITATION     PROGRESS NOTE    Subjective/Complaints: Patient seen lying in bed this morning. He states he slept well overnight. He states he is doing well. He wants to know why his skin is sore.  ROS: Denies nausea, vomiting, diarrhea, shortness of breath or chest pain   Objective: Vital Signs: Blood pressure (!) 172/75, pulse 70, temperature (!) 97.4 F (36.3 C), temperature source Oral, resp. rate 18, height 6\' 1"  (1.854 m), weight 128.7 kg (283 lb 11.2 oz), SpO2 93 %. No results found.  Recent Labs  10/15/17 0552  WBC 5.2  HGB 11.2*  HCT 35.1*  PLT 216    Recent Labs  10/15/17 0552  NA 137  K 4.3  CL 103  GLUCOSE 182*  BUN 26*  CREATININE 1.21  CALCIUM 8.9   CBG (last 3)   Recent Labs  10/17/17 0631 10/17/17 1129 10/17/17 1717  GLUCAP 194* 148* 152*    Wt Readings from Last 3 Encounters:  10/17/17 128.7 kg (283 lb 11.2 oz)  10/08/17 128.1 kg (282 lb 4.8 oz)  09/01/17 132.5 kg (292 lb)    Physical Exam:  Constitutional: Well-developed. NAD. Obese  HENT: Normocephalicand atraumatic.  Eyes: EOMI. No discharge. Cardiovascular: RRR. No JVD.  Respiratory: CTA Bilaterally. Normal effort  GI: Bowel sounds are normal. He exhibits no distension.  Musculoskeletal: (persistent edema BLE).  Stasis changes BLE Skin: Skin is warmand dry. He is not diaphoretic.  Psychiatric: He has a normal mood and affect. His behavior is normal. Judgmentand thought contentnormal.  Neuro: Motor 4+/5 in Bilateral deltoid, biceps, triceps, grip 4-/5 bilaterally hip flexor, knee extensor and ankle dorsiflexor.   Assessment/Plan: 1. 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 Physiatrist is providing close team supervision and 24 hour management of active medical  problems listed below. Physiatrist and rehab team continue to assess barriers to discharge/monitor patient progress toward functional and medical goals.  Function:  Bathing Bathing position   Position: Shower  Bathing parts Body parts bathed by patient: Right arm, Left arm, Chest, Abdomen, Front perineal area, Buttocks, Right upper leg, Left upper leg, Right lower leg, Left lower leg Body parts bathed by helper: Back  Bathing assist Assist Level: Touching or steadying assistance(Pt > 75%)      Upper Body Dressing/Undressing Upper body dressing   What is the patient wearing?: Pull over shirt/dress     Pull over shirt/dress - Perfomed by patient: Thread/unthread left sleeve, Put head through opening, Pull shirt over trunk, Thread/unthread right sleeve          Upper body assist Assist Level: Supervision or verbal cues, Set up   Set up : To obtain clothing/put away  Lower Body Dressing/Undressing Lower body dressing   What is the patient wearing?: Underwear, Pants Underwear - Performed by patient: Thread/unthread right underwear leg, Thread/unthread left underwear leg, Pull underwear up/down Underwear - Performed by helper: Thread/unthread right underwear leg, Thread/unthread left underwear leg, Pull underwear up/down Pants- Performed by patient: Thread/unthread right pants leg, Thread/unthread left pants leg, Pull pants up/down Pants- Performed by helper: Fasten/unfasten pants           Shoes - Performed by helper: Don/doff right shoe, Don/doff left shoe, Fasten right, Fasten left       TED Hose - Performed by helper: Don/doff right TED hose,  Don/doff left TED hose  Lower body assist Assist for lower body dressing: Touching or steadying assistance (Pt > 75%), Assistive device Assistive Device Comment: dressing stick    Toileting Toileting   Toileting steps completed by patient: Adjust clothing prior to toileting, Performs perineal hygiene Toileting steps completed by  helper: Adjust clothing after toileting Toileting Assistive Devices: Grab bar or rail  Toileting assist Assist level: Touching or steadying assistance (Pt.75%)   Transfers Chair/bed transfer   Chair/bed transfer method: Ambulatory Chair/bed transfer assist level: Supervision or verbal cues Chair/bed transfer assistive device: Armrests, Medical sales representative     Max distance: 180' Assist level: Supervision or verbal cues   Wheelchair   Type: Manual Max wheelchair distance: 150 Assist Level: Supervision or verbal cues  Cognition Comprehension Comprehension assist level: Follows complex conversation/direction with no assist  Expression Expression assist level: Expresses complex ideas: With no assist  Social Interaction Social Interaction assist level: Interacts appropriately with others - No medications needed.  Problem Solving Problem solving assist level: Solves complex problems: Recognizes & self-corrects  Memory Memory assist level: Complete Independence: No helper   Medical Problem List and Plan: 1. 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 DVT Prophylaxis/Anticoagulation: Pharmaceutical: Lovenox 3. Pain Management: [name] 4. Mood: LCSW to follow for evaluation and support.  5. Neuropsych: This patient iscapable of making decisions on hisown behalf. 6. Skin/Wound Care: Routine pressure relief measures. Maintain adequate nutritional and hydration status.  7. Fluids/Electrolytes/Nutrition: Strict I/O. Low salt diet. 8. Acute on chronic diastolic CHF: Strict I/O with daily weights to monitor for overload. On hydralazine, losartan and lasix.   -addnl lasix 10/19-10/22 Filed Weights   10/15/17 1000 10/16/17 0620 10/17/17 0645  Weight: 129.9 kg (286 lb 4.8 oz) 129.3 kg (285 lb 1.6 oz) 128.7 kg (283 lb 11.2 oz)   Slightly improved on 10/20 9. Chronic hypoxia with new diagnosis of severe  obstructive airway disease: Off oxygen now.   -activity tolerance showing improvement 10. HTN: monitor BP bid.   Hydralazine increased to 50 mg on 10/21 11. OSA: CPAP daily  12.. Acute on chronic CKD: Will need follow up MRI on outpatient basis. Monitor SCr.  -Cr 1.21 on 10/18, improving 13. 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  14. Citrobacter UTI: completed Antibiotics 15. Hyperglycemia: changed to CM diet. SSI  -dietary ed 16. ABLA  Hemoglobin 11.2 on 10/18  Continue to monitor 17. Diabetes mellitus   Amaryl 1 mg started on 10/21 18. Morbid obesity  Body mass index is 37.43 kg/m.  Diet and exercise education  Encourage weight loss to increase endurance and promote overall health   LOS (Days) 9 A FACE TO FACE EVALUATION WAS PERFORMED  Amar Keenum Lorie Phenix, MD 10/17/2017 9:12 PM

## 2017-10-17 NOTE — Progress Notes (Signed)
Social Work Patient ID: Travis [name], male   DOB: Jun 12, 1946, 71 y.o.   MRN: [id number]   CSW met with pt and his wife on 10-14-17 to update them on team conference discussion.  Told him of 10-23-17 targeted d/c date and he and wife were pleased with that.  Pt thought that it was 10-22-17, but he is okay with 10-23-17.  Wife is healing well, but will not be able to help pt up physically. She can provide supervision and can help pt with his footwear.  Therapists to provide family education and see if wife can do min A for bathing.  Pt prefers outpt Rehab at d/c. Pt wants a commode like the one in his bathroom in his hospital room.  CSW talked to OT, Joellen Jersey, about this request and she will work with pt and wife about her recommendations for pt.  Pt will need to purchase anything he wants, as he left CIR with [facility] last time 2 years ago. CSW will continue to follow and assist as needed.

## 2017-10-17 NOTE — Patient Care Conference (Signed)
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 Diabetes mellitus type 2 in obese ([place])   . 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 Debility 10/08/2017  . Benign essential HTN   . Stage 3 chronic kidney disease ([place])   . Spinal stenosis of lumbar region   . OSA (obstructive sleep apnea)   . Acute blood loss anemia   . Fever 10/02/2017  . Urinary tract infection without hematuria   . Acute on chronic diastolic CHF (congestive heart failure) ([place])   . Chronic diastolic CHF (congestive heart failure) ([place])   . Elevated troponin I level   . Acute on chronic combined systolic and diastolic CHF (congestive heart failure) ([place])   . Type II diabetes mellitus ([place]) 08/19/2015  . HCAP (healthcare-associated pneumonia) 08/19/2015  . Hyperthyroidism 08/19/2015  . Hypothyroid 08/19/2015  . AKI (acute kidney injury) ([place]) 08/19/2015  . Blood poisoning   . Chronic combined systolic and diastolic congestive heart failure ([place]) 08/17/2015  . Restless legs syndrome 03/14/2015  . DOE (dyspnea on exertion) 03/09/2015  . Sinus bradycardia 09/21/2014  . Thoracic spondylosis with myelopathy 09/13/2014  . Acute diastolic heart failure ([place]) 07/18/2014  . Cervical arthritis with myelopathy 07/18/2014  . Constipation - functional   . CAP (community acquired pneumonia) 07/10/2014  . Sepsis ([place]) 07/10/2014  . HTN (hypertension) 07/10/2014  . OSA on CPAP 07/10/2014   . Acute lower UTI 07/10/2014  . Edema leg 07/10/2014  . Acute respiratory failure with hypoxia ([place]) 07/10/2014  . 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 Other vitamin B12 deficiency anemia 07/13/2013  . 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 Polyneuropathy in other diseases classified elsewhere ([place]) 02/17/2013  . Other general symptoms(780.99) 02/17/2013  . Abnormality of gait 02/17/2013  . Cervical spondylosis with myelopathy 02/17/2013  . Back ache 09/15/2012    Expected Discharge Date: Expected Discharge Date: 10/23/17  Team Members Present: Physician leading conference: Dr. Alger Simons Social Worker Present: Alfonse Alpers, LCSW Nurse Present: Junius Creamer, RN PT Present: Kem Parkinson, PT OT Present: Willeen Cass, OT SLP Present: Weston Anna, SLP;Madison Cratch, SLP PPS Coordinator present : Daiva Nakayama, RN, CRRN     Current Status/Progress Goal Weekly Team Focus  Medical   debility with history of myelopathy  improve activity tolerance  balance volume, bp control   Bowel/Bladder   Continent of bladder/bowel LBM 10/12/17  Remain continent assist to maintain continent episodes  Assess QS, maintain continent episodes, prn laxative if needed within 3 days   Swallow/Nutrition/ Hydration             ADL's   mod A bathing, min A LB self care, UB set up A, functional transfer mod A  min A LB, supervision overall, mod I for grooming,UB dressing  self care retraining, pt/family education, standing balance, strengthening, endurance   Mobility   modA bed mobility, min/m! sit <>stand, minA gait with  RW  S overall, modI w/c propulsion for strengthening/endurance  LE/trunk ROM, strengthening, activity tolerance, dynamic standing balance   Communication             Safety/Cognition/ Behavioral Observations  No report of falls or injuries, Alert and oriented x4  No reports of falls or injuries while on Rehab Depart  Assess QS and Maintain  safety measures at all times   Pain   Denies pain or discomfort  Pain < 3  Assess QS and prn monitor and administered MD's orders   Skin              Rehab Goals Patient on target to meet rehab goals: Yes Rehab Goals Revised: none *See Care Plan and progress notes for long and short-term goals.     Barriers to Discharge  Current Status/Progress Possible Resolutions Date Resolved   Physician    Medical stability        ongoing volume mgt, bp control      Nursing                  PT  Decreased caregiver support  wife recently hospitalized, will likely be able to provide decreased physical assistance compared to previously              OT Other (comments)  wife with recent surgery impacting ability to assist             SLP                SW                Discharge Planning/Teaching Needs:  Pt to return to his home he shares with his wife.  Wife just had surgery and can only provide supervision.  Wife has been coming to CIR to see pt's progress.   Team Discussion:  Pt is starting to perk up per Dr. Naaman Plummer.  His volumes and BP are okay, working on blood sugars.  Pt is min to mod A with PT and is min guard for walking with supervision level goals.  Pt will need min A for footwear and bathing and otherwise supervision for OT goals.     Revisions to Treatment Plan:  none    Continued Need for Acute Rehabilitation Level of Care: The patient requires daily medical management by a physician with specialized training in physical medicine and rehabilitation for the following conditions: Daily direction of a multidisciplinary physical rehabilitation program to ensure safe treatment while eliciting the highest outcome that is of practical value to the patient.: Yes Daily medical management of patient stability for increased activity during participation in an intensive rehabilitation regime.: Yes Daily analysis of laboratory values and/or radiology reports with any subsequent need for  medication adjustment of medical intervention for : Neurological problems;Cardiac problems  Travis [name], [name] 10/17/2017, 10:47 PM

## 2017-10-17 NOTE — Progress Notes (Signed)
Occupational Therapy Session Note  Patient Details  Name: Travis [name] MRN: [id number] Date of Birth: [birth year]  Today's Date: 10/17/2017 OT Individual Time: 9381-8299 OT Individual Time Calculation (min): 28 min    Short Term Goals: Week 2:  OT Short Term Goal 1 (Week 2): STG=LTGs 2/2 ELOS  Skilled Therapeutic Interventions/Progress Updates:    1:1. Ofcus of session on ambualtion with RW, standing balance and sit to stand from 19" mat with 1UE and no UE support. Pt ambualtes to/from tx gym with RW with supervision and Vc for foot clearnace and decreasing reliance on UEs for support on walker. Pt sit to stand 2x with touching A from 19" mat to match playing cards on vertical board  To improve standing balance and endurance required for ADLs. Pt able to maintain balance during acitvity with CGA without UE support on walker and 1 LOB anteriorly with min A to recover. Exited sessio nwiht pt seated in w/c with call light in reach and all needs met.  Therapy Documentation Precautions:  Precautions Precautions: Fall Precaution Comments: sats dropped to 86% with activity, returned to WNL with 1-2 min rest breaks Restrictions Weight Bearing Restrictions: No  See Function Navigator for Current Functional Status.   Therapy/Group: Individual Therapy  Tonny Branch 10/17/2017, 2:29 PM

## 2017-10-18 ENCOUNTER — Inpatient Hospital Stay (HOSPITAL_COMMUNITY): Payer: Medicare Other

## 2017-10-18 DIAGNOSIS — I10 Essential (primary) hypertension: Secondary | ICD-10-CM

## 2017-10-18 DIAGNOSIS — K5901 Slow transit constipation: Secondary | ICD-10-CM

## 2017-10-18 LAB — GLUCOSE, CAPILLARY
GLUCOSE-CAPILLARY: 144 mg/dL — AB (ref 65–99)
GLUCOSE-CAPILLARY: 165 mg/dL — AB (ref 65–99)
GLUCOSE-CAPILLARY: 157 mg/dL — AB (ref 65–99)
Glucose-Capillary: 133 mg/dL — ABNORMAL HIGH (ref 65–99)

## 2017-10-18 MED ORDER — SENNOSIDES-DOCUSATE SODIUM 8.6-50 MG PO TABS
2.0000 | ORAL_TABLET | Freq: Two times a day (BID) | ORAL | Status: DC
  Administered 2017-10-18 – 2017-10-20 (×4): 2 via ORAL
  Filled 2017-10-18 (×4): qty 2

## 2017-10-18 NOTE — Progress Notes (Signed)
Occupational Therapy Session Note  Patient Details  Name: Travis [name] MRN: [id number] Date of Birth: 04/30/1946  Today's Date: 10/18/2017 OT Individual Time: 1342-1450 OT Individual Time Calculation (min): 68 min    Short Term Goals: Week 1:  OT Short Term Goal 1 (Week 1): Pt will sit to stand wiht MIN A in prep for clothing managment OT Short Term Goal 1 - Progress (Week 1): Met OT Short Term Goal 2 (Week 1): Pt will thread BLE into pants wiht AE PRN and supervision OT Short Term Goal 2 - Progress (Week 1): Met OT Short Term Goal 3 (Week 1): Pt will groom in standing 2/2 items to improve functional endurance OT Short Term Goal 3 - Progress (Week 1): Met OT Short Term Goal 4 (Week 1): Pt will complete clothing manamgent prior to toileting with touching A OT Short Term Goal 4 - Progress (Week 1): Met OT Short Term Goal 5 (Week 1): Pt will complete shower transfer with min A OT Short Term Goal 5 - Progress (Week 1): Met  Skilled Therapeutic Interventions/Progress Updates:    1:1. Focus of session on energy conservation in kitchen, standing balance, standing tolerance and adaptive cooking strategies in kitchen. Pt stands/ ambulates throughout kitchen with supervision with Vc for RW placement to reach and obtain needed item from cabinets and shelves. Pt stands with unilateral and no UE support with VC for posture and use of counter to stabilize. OT educates on strategies for energy conservation such as sliding pot across counter, using rack puller for oven to decrease bending, pacing, planning out needed items, and sitting to mix/prepare chocolate pie filling/sausage balls. Exited session with pt seated in w/c with call light in reach and all needs met.  Therapy Documentation Precautions:  Precautions Precautions: Fall Precaution Comments: sats dropped to 86% with activity, returned to WNL with 1-2 min rest breaks Restrictions Weight Bearing Restrictions: No  See Function Navigator  for Current Functional Status.   Therapy/Group: Individual Therapy   M  10/18/2017, 2:39 PM 

## 2017-10-18 NOTE — Progress Notes (Signed)
[facility] PHYSICAL MEDICINE & REHABILITATION     PROGRESS NOTE    Subjective/Complaints: Pt sen laying in bed this AM.  He slept well overnight.  He states he is doing well, but is constipated.   ROS: +Constipation. Denies nausea, vomiting, diarrhea, shortness of breath or chest pain   Objective: Vital Signs: Blood pressure (!) 139/50, pulse 67, temperature 97.8 F (36.6 C), temperature source Oral, resp. rate 18, height 6\' 1"  (1.854 m), weight 128.2 kg (282 lb 9.6 oz), SpO2 93 %. No results found. No results for input(s): WBC, HGB, HCT, PLT in the last 72 hours. No results for input(s): NA, K, CL, GLUCOSE, BUN, CREATININE, CALCIUM in the last 72 hours.  Invalid input(s): CO CBG (last 3)   Recent Labs  10/17/17 1717 10/18/17 0643 10/18/17 1120  GLUCAP 152* 144* 165*    Wt Readings from Last 3 Encounters:  10/18/17 128.2 kg (282 lb 9.6 oz)  10/08/17 128.1 kg (282 lb 4.8 oz)  09/01/17 132.5 kg (292 lb)    Physical Exam:  Constitutional: Well-developed. NAD. Obese  HENT: Normocephalicand atraumatic.  Eyes: EOMI. No discharge. Cardiovascular: RRR. No JVD.  Respiratory: CTA Bilaterally. Normal effort  GI: Bowel sounds are normal. +Distension.  Musculoskeletal: (persistent edema BLE).  Stasis changes BLE Skin: Skin is warmand dry. He is not diaphoretic.  Psychiatric: He has a normal mood and affect. His behavior is normal. Judgmentand thought contentnormal.  Neuro: Motor 4+/5 in Bilateral deltoid, biceps, triceps, grip 3+/5 bilaterally hip flexor, knee extensor and ankle dorsiflexor.   Assessment/Plan: 1. 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 Physiatrist is providing close team supervision and 24 hour management of active medical problems listed below. Physiatrist and rehab team  continue to assess barriers to discharge/monitor patient progress toward functional and medical goals.  Function:  Bathing Bathing position   Position: Shower  Bathing parts Body parts bathed by patient: Right arm, Left arm, Chest, Abdomen, Front perineal area, Buttocks, Right upper leg, Left upper leg, Right lower leg, Left lower leg Body parts bathed by helper: Back  Bathing assist Assist Level: Touching or steadying assistance(Pt > 75%)      Upper Body Dressing/Undressing Upper body dressing   What is the patient wearing?: Pull over shirt/dress     Pull over shirt/dress - Perfomed by patient: Thread/unthread left sleeve, Put head through opening, Pull shirt over trunk, Thread/unthread right sleeve          Upper body assist Assist Level: Supervision or verbal cues, Set up   Set up : To obtain clothing/put away  Lower Body Dressing/Undressing Lower body dressing   What is the patient wearing?: Underwear, Pants Underwear - Performed by patient: Thread/unthread right underwear leg, Thread/unthread left underwear leg, Pull underwear up/down Underwear - Performed by helper: Thread/unthread right underwear leg, Thread/unthread left underwear leg, Pull underwear up/down Pants- Performed by patient: Thread/unthread right pants leg, Thread/unthread left pants leg, Pull pants up/down Pants- Performed by helper: Fasten/unfasten pants           Shoes - Performed by helper: Don/doff right shoe, Don/doff left shoe, Fasten right, Fasten left       TED Hose - Performed by helper: Don/doff right TED hose, Don/doff left TED hose  Lower body assist Assist for lower body dressing: Touching or steadying assistance (Pt > 75%), Assistive device Assistive Device  Comment: dressing stick    Toileting Toileting   Toileting steps completed by patient: Adjust clothing prior to toileting, Performs perineal hygiene, Adjust clothing after toileting Toileting steps completed by helper: Adjust  clothing after toileting Toileting Assistive Devices: Grab bar or rail  Toileting assist Assist level: Touching or steadying assistance (Pt.75%)   Transfers Chair/bed transfer   Chair/bed transfer method: Ambulatory Chair/bed transfer assist level: Supervision or verbal cues Chair/bed transfer assistive device: Armrests, Medical sales representative     Max distance: 180' Assist level: Supervision or verbal cues   Wheelchair   Type: Manual Max wheelchair distance: 150 Assist Level: Supervision or verbal cues  Cognition Comprehension Comprehension assist level: Follows complex conversation/direction with no assist  Expression Expression assist level: Expresses complex ideas: With no assist  Social Interaction Social Interaction assist level:  (on Lexapro)  Problem Solving Problem solving assist level: Solves complex problems: Recognizes & self-corrects  Memory Memory assist level: Complete Independence: No helper   Medical Problem List and Plan: 1. 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 DVT Prophylaxis/Anticoagulation: Pharmaceutical: Lovenox 3. Pain Management: [name] 4. Mood: LCSW to follow for evaluation and support.  5. Neuropsych: This patient iscapable of making decisions on hisown behalf. 6. Skin/Wound Care: Routine pressure relief measures. Maintain adequate nutritional and hydration status.  7. Fluids/Electrolytes/Nutrition: Strict I/O. Low salt diet. 8. Acute on chronic diastolic CHF: Strict I/O with daily weights to monitor for overload. On hydralazine, losartan and lasix.   -addnl lasix 10/19-10/22 Filed Weights   10/16/17 0620 10/17/17 0645 10/18/17 0547  Weight: 129.3 kg (285 lb 1.6 oz) 128.7 kg (283 lb 11.2 oz) 128.2 kg (282 lb 9.6 oz)   Continues to slowly improve 9. Chronic hypoxia with new diagnosis of severe obstructive airway disease: Off oxygen now.   -activity  tolerance showing improvement 10. HTN: monitor BP bid.   Hydralazine increased to 50 mg on 10/21  Improving 11. OSA: CPAP daily  12.. Acute on chronic CKD: Will need follow up MRI on outpatient basis. Monitor SCr.  -Cr 1.21 on 10/18, improving 13. 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  14. Citrobacter UTI: completed Antibiotics 15. Hyperglycemia: changed to CM diet. SSI  -dietary ed 16. ABLA  Hemoglobin 11.2 on 10/18  Continue to monitor 17. Diabetes mellitus   Amaryl 1 mg started on 10/21  Cont to monitor, consider further increase as necessary 18. Morbid obesity  Body mass index is 37.28 kg/m.  Diet and exercise education  Encourage weight loss to increase endurance and promote overall health  19. Constipation  Bowel reg increased on 10/21  LOS (Days) 10 A FACE TO FACE EVALUATION WAS PERFORMED  Brittan Butterbaugh Lorie Phenix, MD 02-06-202018 2:57 PM

## 2017-10-19 ENCOUNTER — Inpatient Hospital Stay (HOSPITAL_COMMUNITY): Payer: Medicare Other | Admitting: Physical Therapy

## 2017-10-19 ENCOUNTER — Encounter (HOSPITAL_COMMUNITY): Payer: Medicare Other

## 2017-10-19 ENCOUNTER — Inpatient Hospital Stay (HOSPITAL_COMMUNITY): Payer: Medicare Other | Admitting: Occupational Therapy

## 2017-10-19 LAB — GLUCOSE, CAPILLARY
GLUCOSE-CAPILLARY: 169 mg/dL — AB (ref 65–99)
GLUCOSE-CAPILLARY: 120 mg/dL — AB (ref 65–99)
GLUCOSE-CAPILLARY: 181 mg/dL — AB (ref 65–99)
Glucose-Capillary: 199 mg/dL — ABNORMAL HIGH (ref 65–99)
Glucose-Capillary: 135 mg/dL — ABNORMAL HIGH (ref 65–99)

## 2017-10-19 NOTE — Discharge Summary (Signed)
Occupational Therapy Discharge Summary  Patient Details  Name: Travis [name] MRN: [id number] Date of Birth: 11/14/1946   Today's Date: 10/19/2017 OT Individual Time: 5956-3875 OT Individual Time Calculation (min): 60 min    Patient has met 9 of 9  long term goals due to improved activity tolerance, improved balance, postural control, ability to compensate for deficits, improved awareness and improved coordination.  Patient to discharge at overall Supervision level.  Patient's care partner is independent to provide the necessary physical and cognitive assistance at discharge.    All goals met.   Recommendation:  Patient will benefit from ongoing skilled OT services in home health setting to continue to advance functional skills in the area of BADL and iADL.  Equipment: No equipment provided  Reasons for discharge: treatment goals met  Patient/family agrees with progress made and goals achieved: Yes   Skilled Therapeutic Interventions/Progress Updates:    Tx focus on adaptive bathing/dressing skills, balance, functional transfers, and activity tolerance during self care completion.   Pt greeted on toilet, had just finished with BM. Requesting to complete shower. Pt doffing LB garments and then ambulated to TTB with RW and cuing for safe walker mgt. Pt bathing while seated on TTB, laterally leaning for pericare and reaching bilateral feet with setup of LH sponge. Afterwards pt ambulated with supervision to w/c placed in room. Clean clothing items already laid on on bed. Pt using adaptive "blue stick" for donning LB garments. Setup for overhead shirt. He required Min A for Teds and shoes (per pt, his wife completes this at home and he has no desire to increase his independence with footwear). Grooming tasks completed w/c level at sink with setup. He then ambulated with RW to dayroom. Practiced sit<stands from low couch that resembled his couch at home (Mod A for lifting assist). Discussed  sitting on higher furniture in home to maximize independence with functional transfers. He verbalized understanding. He then ambulated back to room and returned to his w/c. Pt left with all needs within reach at time of departure.   OT Discharge Precautions/Restrictions  Precautions Precautions: Fall Restrictions Weight Bearing Restrictions: No Vital Signs Therapy Vitals Temp: 97.7 F (36.5 C) Temp Source: Oral Pulse Rate: 66 Resp: 20 BP: (!) 149/66 (RN Transport planner) Patient Position (if appropriate): Lying Oxygen Therapy SpO2: 92 % O2 Device: Not Delivered Pain No c/o pain during tx   ADL ADL ADL Comments: Please see functional navigator for ADL status Vision Baseline Vision/History: Wears glasses Wears Glasses: Reading only Patient Visual Report: No change from baseline Vision Assessment?: No apparent visual deficits Perception  Perception: Within Functional Limits Praxis Praxis: Intact Cognition Overall Cognitive Status: Within Functional Limits for tasks assessed Arousal/Alertness: Awake/alert Orientation Level: Oriented X4 Selective Attention: Appears intact Memory: Appears intact Awareness: Appears intact Problem Solving: Appears intact Safety/Judgment: Appears intact Sensation Sensation Light Touch: Appears Intact Stereognosis: Not tested Hot/Cold: Not tested Proprioception: Appears Intact Coordination Gross Motor Movements are Fluid and Coordinated: No Fine Motor Movements are Fluid and Coordinated: Yes Motor  Motor Motor: Within Functional Limits Motor - Discharge Observations:  (generalized weakness) Trunk/Postural Assessment  Cervical Assessment Cervical Assessment: Within Functional Limits Thoracic Assessment Thoracic Assessment: Exceptions to [facility] (kyphotic) Lumbar Assessment Lumbar Assessment: Exceptions to [facility] (posterior pelvic tilt) Postural Control Postural Control: Deficits on evaluation  Balance Balance Balance Assessed:  Yes Dynamic Sitting Balance Dynamic Sitting - Balance Support: Feet supported Dynamic Sitting - Level of Assistance: 5: Stand by assistance Dynamic Sitting - Balance Activities: Lateral  lean/weight shifting;Forward lean/weight shifting;Reaching for objects;Reaching across midline Sitting balance - Comments: bathing Dynamic Standing Balance Dynamic Standing - Balance Support: Left upper extremity supported Dynamic Standing - Level of Assistance: 5: Stand by assistance Dynamic Standing - Balance Activities: Forward lean/weight shifting;Lateral lean/weight shifting Dynamic Standing - Comments:  (LB dressing) Extremity/Trunk Assessment RUE Assessment RUE Assessment: Within Functional Limits LUE Assessment LUE Assessment: Within Functional Limits   See Function Navigator for Current Functional Status.  Noam Franzen A Nalleli Largent 10/19/2017, 4:31 PM

## 2017-10-19 NOTE — Progress Notes (Signed)
Physical Therapy Discharge Summary  Patient Details  Name: Travis [name] MRN: [id number] Date of Birth: 12-26-1946  Today's Date: 10/19/2017   Patient has met 6 of 6 long term goals due to improved activity tolerance, improved balance, improved postural control, increased strength, increased range of motion, ability to compensate for deficits, functional use of  right lower extremity and left lower extremity and improved coordination.  Patient to discharge at an ambulatory level Supervision.   Patient's care partner is independent to provide the necessary physical assistance at discharge.  Reasons goals not met: All goals met  Recommendation:  Patient will benefit from ongoing skilled PT services in outpatient setting to continue to advance safe functional mobility, address ongoing impairments in ROM, strength, balance, coordination, and minimize fall risk. Provided pt with LE stretching/strengthening HEP for continued performance after d/c until follow up and reassessment by outpatient PT.   Equipment: No equipment provided  Reasons for discharge: treatment goals met and discharge from hospital  Patient/family agrees with progress made and goals achieved: Yes  PT Discharge Precautions/Restrictions Precautions Precautions: Fall Restrictions Weight Bearing Restrictions: No Vital Signs Therapy Vitals Temp: 97.7 F (36.5 C) Temp Source: Oral Pulse Rate: 66 Resp: 20 BP: (!) 149/66 (RN Transport planner) Patient Position (if appropriate): Lying Oxygen Therapy SpO2: 92 % O2 Device: Not Delivered Pain  denies pain Vision/Perception  Perception Perception: Within Functional Limits Praxis Praxis: Intact  Cognition Overall Cognitive Status: Within Functional Limits for tasks assessed Arousal/Alertness: Awake/alert Orientation Level: Oriented X4 Selective Attention: Appears intact Memory: Appears intact Awareness: Appears intact Problem Solving: Appears  intact Safety/Judgment: Appears intact Sensation Sensation Light Touch: Appears Intact Stereognosis: Not tested Hot/Cold: Not tested Proprioception: Appears Intact Coordination Gross Motor Movements are Fluid and Coordinated: No Heel Shin Test: impaired BLE, decreased excursion and speed Motor  Motor Motor: Within Functional Limits Motor - Discharge Observations: generalized weakness, LLE weaker than RLE, ROM restrictions and decreased power production overall  Mobility Bed Mobility Bed Mobility: Supine to Sit;Sit to Supine Supine to Sit: 5: Supervision Supine to Sit Details: Verbal cues for technique Sit to Supine: 5: Supervision Sit to Supine - Details: Verbal cues for technique Transfers Transfers: Yes Sit to Stand: 5: Supervision;With armrests;With upper extremity assist;From elevated surface Sit to Stand Details: Verbal cues for technique;Verbal cues for precautions/safety Stand Pivot Transfers: 5: Supervision;With armrests Stand Pivot Transfer Details: Verbal cues for safe use of DME/AE;Verbal cues for precautions/safety Locomotion  Ambulation Ambulation: Yes Ambulation/Gait Assistance: 5: Supervision Ambulation Distance (Feet): 200 Feet Assistive device: Rolling walker Ambulation/Gait Assistance Details: Verbal cues for precautions/safety;Verbal cues for technique Gait Gait: Yes Gait Pattern: Impaired Gait Pattern: Step-through pattern;Poor foot clearance - left;Poor foot clearance - right;Trunk flexed;Lateral hip instability;Left foot flat;Right foot flat;Decreased dorsiflexion - right;Decreased dorsiflexion - left;Decreased stride length Gait velocity: 1.2 ft/sec Stairs / Additional Locomotion Stairs: No Ramp: 5: Psychiatric nurse: Yes Wheelchair Assistance: 6: Modified independent (Device/Increase time) Environmental health practitioner: Both upper extremities Wheelchair Parts Management: Independent Distance: 150  Trunk/Postural  Assessment  Cervical Assessment Cervical Assessment: Within Functional Limits (forward head posture) Thoracic Assessment Thoracic Assessment: Exceptions to [facility] (rounded shoulders, increased kyphosis) Lumbar Assessment Lumbar Assessment: Exceptions to [facility] (posterior pelvic tilt, decreased lumbopelvic dissociation) Postural Control Postural Control: Deficits on evaluation (delayed/inefficient stepping/righting reactions)  Balance Balance Balance Assessed: Yes Dynamic Sitting Balance Dynamic Sitting - Balance Support: Feet supported Dynamic Sitting - Level of Assistance: 6: Modified independent (Device/Increase time) Dynamic Sitting - Balance Activities: Lateral lean/weight shifting;Forward lean/weight shifting;Reaching  for objects Dynamic Standing Balance Dynamic Standing - Balance Support: Bilateral upper extremity supported Dynamic Standing - Level of Assistance: 5: Stand by assistance Dynamic Standing - Balance Activities: Lateral lean/weight shifting;Forward lean/weight shifting;Reaching for objects Extremity Assessment  RUE Assessment RUE Assessment: Within Functional Limits LUE Assessment LUE Assessment: Within Functional Limits RLE Assessment RLE Assessment: Exceptions to [facility] RLE PROM (degrees) Overall PROM Right Lower Extremity: Deficits;Due to premorbid status Right Hip Extension: -10 Right Knee Extension:  (hamstring length significantly limited) Right Ankle Dorsiflexion: 0 Right Ankle Plantar Flexion: 0 RLE Strength RLE Overall Strength: Deficits;Due to premorbid status RLE Overall Strength Comments: grossly 4/5 within available range LLE Assessment LLE Assessment: Exceptions to WFL LLE PROM (degrees) Overall PROM Left Lower Extremity: Deficits;Due to premorbid status Left Hip Extension: -10 Left Knee Extension: limited hamstring range Left Ankle Dorsiflexion: 0 LLE Strength LLE Overall Strength: Due to premorbid status;Deficits LLE Overall Strength Comments:  grossly 4/5 within available range   See Function Navigator for Current Functional Status.  Travis [name] 10/19/2017, 4:17 PM

## 2017-10-19 NOTE — Progress Notes (Signed)
Pt wears his home cpap.  Pt does not need assistance.

## 2017-10-19 NOTE — Progress Notes (Signed)
[place] PHYSICAL MEDICINE & REHABILITATION     PROGRESS NOTE    Subjective/Complaints: No new complaints. Feels that he's about ready to go home (again).   ROS: pt denies nausea, vomiting, diarrhea, cough, shortness of breath or chest pain   Objective: Vital Signs: Blood pressure (!) 150/60, pulse 61, temperature 98.5 F (36.9 C), temperature source Oral, resp. rate 18, height 6\' 1"  (1.854 m), weight 128.5 kg (283 lb 4.8 oz), SpO2 94 %. No results found. No results for input(s): WBC, HGB, HCT, PLT in the last 72 hours. No results for input(s): NA, K, CL, GLUCOSE, BUN, CREATININE, CALCIUM in the last 72 hours.  Invalid input(s): CO CBG (last 3)   Recent Labs  10/18/17 2111 10/19/17 0634 10/19/17 1211  GLUCAP 157* 169* 120*    Wt Readings from Last 3 Encounters:  10/19/17 128.5 kg (283 lb 4.8 oz)  10/08/17 128.1 kg (282 lb 4.8 oz)  09/01/17 132.5 kg (292 lb)    Physical Exam:  Constitutional: Well-developed. NAD. Obese  HENT: Normocephalicand atraumatic.  Eyes: EOMI. No discharge. Cardiovascular: RRR without murmur. No JVD.  Respiratory: CTA Bilaterally without wheezes or rales. Normal effort  GI: Bowel sounds are normal. He exhibits no distension.  Musculoskeletal: (persistent 1+ edema BLE).  Stasis changes BLE Skin: Skin is warmand dry. He is not diaphoretic.  Psychiatric: He has a normal mood and affect. His behavior is normal. Judgmentand thought contentnormal.  Neuro: Motor 4+/5 in Bilateral deltoid, biceps, triceps, grip 4-/5 bilaterally hip flexor, knee extensor and ankle dorsiflexor.   Assessment/Plan: 1. 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 Physiatrist is providing close team supervision and 24 hour management of active medical problems listed below. Physiatrist and  rehab team continue to assess barriers to discharge/monitor patient progress toward functional and medical goals.  Function:  Bathing Bathing position   Position: Shower  Bathing parts Body parts bathed by patient: Right arm, Left arm, Chest, Abdomen, Front perineal area, Buttocks, Right upper leg, Left upper leg, Right lower leg, Left lower leg, Back Body parts bathed by helper: Back  Bathing assist Assist Level: Set up      Upper Body Dressing/Undressing Upper body dressing   What is the patient wearing?: Pull over shirt/dress     Pull over shirt/dress - Perfomed by patient: Thread/unthread left sleeve, Put head through opening, Pull shirt over trunk, Thread/unthread right sleeve          Upper body assist Assist Level: More than reasonable time   Set up : To obtain clothing/put away  Lower Body Dressing/Undressing Lower body dressing   What is the patient wearing?: Underwear, Pants, [employer], Shoes Underwear - Performed by patient: Thread/unthread right underwear leg, Thread/unthread left underwear leg, Pull underwear up/down Underwear - Performed by helper: Thread/unthread right underwear leg, Thread/unthread left underwear leg, Pull underwear up/down Pants- Performed by patient: Thread/unthread right pants leg, Thread/unthread left pants leg, Pull pants up/down Pants- Performed by helper: Fasten/unfasten pants           Shoes - Performed by helper: Don/doff right shoe, Don/doff left shoe, Fasten right, Fasten left       TED Hose - Performed by helper: Don/doff right TED hose, Don/doff left TED hose  Lower body assist Assist for lower body dressing: Touching or steadying assistance (Pt > 75%), Assistive device Assistive Device Comment:  dressing stick    Toileting Toileting   Toileting steps completed by patient: Adjust clothing prior to toileting, Performs perineal hygiene, Adjust clothing after toileting Toileting steps completed by helper: Adjust clothing after  toileting Toileting Assistive Devices: Grab bar or rail  Toileting assist Assist level: Set up/obtain supplies   Transfers Chair/bed transfer   Chair/bed transfer method: Stand pivot Chair/bed transfer assist level: Supervision or verbal cues Chair/bed transfer assistive device: Armrests, Medical sales representative     Max distance: 175 Assist level: Supervision or verbal cues   Wheelchair   Type: Manual Max wheelchair distance: 150 Assist Level: Supervision or verbal cues  Cognition Comprehension Comprehension assist level: Follows complex conversation/direction with no assist  Expression Expression assist level: Expresses complex ideas: With no assist  Social Interaction Social Interaction assist level: Interacts appropriately with others with medication or extra time (anti-anxiety, antidepressant).  Problem Solving Problem solving assist level: Solves complex problems: Recognizes & self-corrects  Memory Memory assist level: Complete Independence: No helper   Medical Problem List and Plan: 1. 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 DVT Prophylaxis/Anticoagulation: Pharmaceutical: Lovenox 3. Pain Management: [name] 4. Mood: LCSW to follow for evaluation and support.  5. Neuropsych: This patient iscapable of making decisions on hisown behalf. 6. Skin/Wound Care: Routine pressure relief measures. Maintain adequate nutritional and hydration status.  7. Fluids/Electrolytes/Nutrition: Strict I/O. Low salt diet. 8. Acute on chronic diastolic CHF: Strict I/O with daily weights to monitor for overload. On hydralazine, losartan and lasix.   -addnl lasix 10/19-10/22 Filed Weights   10/17/17 0645 10/18/17 0547 10/19/17 0544  Weight: 128.7 kg (283 lb 11.2 oz) 128.2 kg (282 lb 9.6 oz) 128.5 kg (283 lb 4.8 oz)   Slightly improved on  10/22  -follow up with cards as outpt 9. Chronic hypoxia with new diagnosis of severe obstructive airway disease: Off oxygen now.   -activity tolerance showing improvement 10. HTN: monitor BP bid.   Hydralazine increased to 50 mg on 10/21 with some improvement 11. OSA: CPAP daily  12.. Acute on chronic CKD: Will need follow up MRI on outpatient basis. Monitor SCr.  -Cr 1.21 on 10/18, improving 13. 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  14. Citrobacter UTI: completed Antibiotics 15. Hyperglycemia: changed to CM diet. SSI  -dietary ed 16. ABLA  Hemoglobin 11.2 on 10/18  Continue to monitor 17. Diabetes mellitus    Amaryl 1 mg started on 10/21--will need to go home on this regimen   -may be able to wean with weight loss/exercise/diet 18. Morbid obesity  Body mass index is 37.38 kg/m.  Diet and exercise education  Encourage weight loss to increase endurance and promote overall health   LOS (Days) 11 A [place]  Meredith Staggers, MD 10/19/2017 2:36 PM

## 2017-10-19 NOTE — Progress Notes (Signed)
Occupational Therapy Session Note  Patient Details  Name: ARHUM PEEPLES MRN: [id number] Date of Birth: Apr 27, 1946  Today's Date: 10/19/2017 OT Group Time: 1300-1400 OT Group Time Calculation (min): 60 min   Skilled Therapeutic Interventions/Progress Updates:    Tx focus on social participation, balance, and activity tolerance during leisure engagement in group setting.   While in dayroom playing scrabble, pt standing at elevated table with RW and supervision. Mod cues for pt to stand with unilateral/no UE support to increase balance challenges. Pt engaging with other activity members throughout task to enhance psychosocial wellbeing. Pt left in dayroom at time of departure, anticipating next therapist.   Therapy Documentation Precautions:  Precautions Precautions: Fall Precaution Comments: sats dropped to 86% with activity, returned to WNL with 1-2 min rest breaks Restrictions Weight Bearing Restrictions: No Pain: No c/o pain during tx    ADL:  :    See Function Navigator for Current Functional Status.   Therapy/Group: Group Therapy  Wrenley Sayed A Silvia Markuson 10/19/2017, 2:52 PM

## 2017-10-19 NOTE — Progress Notes (Signed)
Physical Therapy Session Note  Patient Details  Name: Travis [name] MRN: [id number] Date of Birth: [birth year]  Today's Date: 10/19/2017 PT Individual Time: 1100-1200 and 1400-1445 PT Individual Time Calculation (min): 60 min and 45 min (total 105 min)   Short Term Goals: Week 2:  PT Short Term Goal 1 (Week 2): =LTG due to estimated LOS  Skilled Therapeutic Interventions/Progress Updates: Tx 1: pt received seated in w/c, denies pain and agreeable to treatment. Gait to gym with RW and S; improving gait speed and step length. Standing hip flexor/gastroc stretch in parallel bars x2 min each side. Standing heel raises, hamstring curls x15 reps each LE. Problem solved through methods of stretching quad rectus femoris d/t inability to get prone, hookyling on side of bed, or standing on one LE with one UE support. Ultimately determined sidelying to be most effective and safest stretch; performed supine <>sit and rolling with S to perform sidelying rectus stretch with leg lifter. Seated hamstring stretch x2 min each side. Performed car transfer with S. Communicated with OT and CSW regarding pt desire to move up d/c date as he has met all goals; plan for wife to be present during PM PT session to review current level of function and ensure wife is able to provide assist as needed. Remained seated in w/c at end of session, all needs in reach.   Tx 2: pt received seated in day room after previous session; denies pain and agreeable to treatment. Gait to return to room with RW and S. Pt's wife present for session to observe and discuss recommendations for S at home and use of AD. Wife verbalizes understanding of recommendations; states pt "looks a million times better" than when he came into the hospital. She reports she is able to don/doff shoes and socks as she was doing before but unable to provide additional assist. Pt performed bed mobility, transfers, gait up and down ramp with wife providing S. Provided pt  with handout of LE stretches/strengthening exercises and encouraged continued performance at d/c. Pt to follow up with OP PT; pt and wife agreeable. Pt and wife with no further questions regarding d/c home at this time. Remained seated in w/c at end of session, all needs in reach.      Therapy Documentation Precautions:  Precautions Precautions: Fall Precaution Comments: sats dropped to 86% with activity, returned to WNL with 1-2 min rest breaks Restrictions Weight Bearing Restrictions: No   See Function Navigator for Current Functional Status.   Therapy/Group: Individual Therapy  Luberta Mutter 10/19/2017, 12:11 PM

## 2017-10-20 ENCOUNTER — Inpatient Hospital Stay (HOSPITAL_COMMUNITY): Payer: Medicare Other | Admitting: Physical Therapy

## 2017-10-20 LAB — GLUCOSE, CAPILLARY: Glucose-Capillary: 152 mg/dL — ABNORMAL HIGH (ref 65–99)

## 2017-10-20 MED ORDER — TOVIAZ 8 MG PO TB24: 8.0000 mg | ORAL_TABLET | Freq: Every day | ORAL | 11 refills | Status: DC

## 2017-10-20 MED ORDER — FUROSEMIDE 20 MG PO TABS: 20.0000 mg | ORAL_TABLET | Freq: Two times a day (BID) | ORAL | 0 refills | Status: DC

## 2017-10-20 MED ORDER — LEVOTHYROXINE SODIUM 175 MCG PO TABS: 175.0000 ug | ORAL_TABLET | Freq: Every day | ORAL | 2 refills | Status: DC

## 2017-10-20 MED ORDER — GLIMEPIRIDE 1 MG PO TABS: 1.0000 mg | ORAL_TABLET | Freq: Every day | ORAL | 0 refills | Status: DC

## 2017-10-20 MED ORDER — BACLOFEN 10 MG PO TABS: 10.0000 mg | ORAL_TABLET | Freq: Three times a day (TID) | ORAL | 1 refills | Status: DC

## 2017-10-20 MED ORDER — TAMSULOSIN HCL 0.4 MG PO CAPS: 0.4000 mg | ORAL_CAPSULE | Freq: Every day | ORAL | 0 refills | Status: DC

## 2017-10-20 MED ORDER — PRAMIPEXOLE DIHYDROCHLORIDE 0.5 MG PO TABS: 0.5000 mg | ORAL_TABLET | Freq: Two times a day (BID) | ORAL | 0 refills | Status: DC

## 2017-10-20 MED ORDER — ESCITALOPRAM OXALATE 10 MG PO TABS: 10.0000 mg | ORAL_TABLET | Freq: Every day | ORAL | 2 refills | Status: DC

## 2017-10-20 MED ORDER — TRAZODONE HCL 50 MG PO TABS: 50.0000 mg | ORAL_TABLET | Freq: Every day | ORAL | 0 refills | Status: DC

## 2017-10-20 MED ORDER — TIOTROPIUM BROMIDE MONOHYDRATE 18 MCG IN CAPS: 18.0000 ug | ORAL_CAPSULE | Freq: Every day | RESPIRATORY_TRACT | 0 refills | Status: DC

## 2017-10-20 MED ORDER — POTASSIUM CHLORIDE ER 10 MEQ PO TBCR: 10.0000 meq | EXTENDED_RELEASE_TABLET | Freq: Two times a day (BID) | ORAL | 3 refills | Status: DC

## 2017-10-20 MED ORDER — HYDRALAZINE HCL 50 MG PO TABS: 50.0000 mg | ORAL_TABLET | Freq: Three times a day (TID) | ORAL | 0 refills | Status: DC

## 2017-10-20 MED ORDER — MOMETASONE FURO-FORMOTEROL FUM 200-5 MCG/ACT IN AERO: 2.0000 | INHALATION_SPRAY | Freq: Two times a day (BID) | RESPIRATORY_TRACT | 0 refills | Status: DC

## 2017-10-20 MED ORDER — GABAPENTIN 400 MG PO CAPS: ORAL_CAPSULE | ORAL | 0 refills | Status: DC

## 2017-10-20 NOTE — Discharge Instructions (Signed)
Inpatient Rehab Discharge Instructions  [place] Discharge date and time:    Activities/Precautions/ Functional Status: Activity: No lifting, driving, or strenuous exercise till cleared by MD Diet: regular diet 2 gram salt Wound Care: none needed   Functional status:  ___ No restrictions     ___ Walk up steps independently ___ 24/7 supervision/assistance   ___ Walk up steps with assistance ___ Intermittent supervision/assistance  ___ Bathe/dress independently ___ Walk with walker     _x__ Bathe/dress with assistance ___ Walk Independently    ___ Shower independently ___ Walk with assistance    ___ Shower with assistance ___ No alcohol     ___ Return to work/school ________  COMMUNITY REFERRALS UPON DISCHARGE:   Outpatient: PT     OT  Agency:  [facility] Phone:  [phone]  Appointment Date/Time:  October 31st at 9:30 AM for Physical Therapy and 10:15 AM for Occupational Therapy - Juliann Pulse could not do your evaluation, but you can request her for subsequent visits. Medical Equipment/Items Ordered:  You would like a different bedside commode than the one you have at home.  Medicare will not pay for another one since that commode was purchased within the last 5 years.  Katie and Camera operator (Passenger transport manager) talked about this with you and have given you handouts on where you can get one that will meet your needs.  Special Instructions:    My questions have been answered and I understand these instructions. I will adhere to these goals and the provided educational materials after my discharge from the hospital.  Patient/Caregiver Signature _______________________________ Date __________  Clinician Signature _______________________________________ Date __________  Please bring this form and your medication list with you to all your follow-up doctor's appointments.

## 2017-10-20 NOTE — Progress Notes (Signed)
Patient discharged to home with family about 1030 with all belongings and discharge instructions given via Marlowe Shores PA. Order to discharge patient but unable to sign off in order management. Patient denied any questions about instructions. Vitals stable.

## 2017-10-20 NOTE — Progress Notes (Signed)
[facility]     PROGRESS NOTE    Subjective/Complaints: Eating breakfast. Excited to go home  ROS: pt denies nausea, vomiting, diarrhea, cough, shortness of breath or chest pain   Objective: Vital Signs: Blood pressure (!) 139/55, pulse 62, temperature 98.5 F (36.9 C), temperature source Oral, resp. rate 18, height 6\' 1"  (1.854 m), weight 128.8 kg (283 lb 14.4 oz), SpO2 93 %. No results found. No results for input(s): WBC, HGB, HCT, PLT in the last 72 hours. No results for input(s): NA, K, CL, GLUCOSE, BUN, CREATININE, CALCIUM in the last 72 hours.  Invalid input(s): CO CBG (last 3)   Recent Labs  10/19/17 1636 10/19/17 2129 10/20/17 0648  GLUCAP 181* 135* 152*    Wt Readings from Last 3 Encounters:  10/20/17 128.8 kg (283 lb 14.4 oz)  10/08/17 128.1 kg (282 lb 4.8 oz)  09/01/17 132.5 kg (292 lb)    Physical Exam:  Constitutional: Well-developed. NAD. Obese  HENT: Normocephalicand atraumatic.  Eyes: EOMI. No discharge. Cardiovascular: RRR without murmur. No JVD .  Respiratory: CTA Bilaterally without wheezes or rales. Normal effort  GI: Bowel sounds are normal. He exhibits no distension.  Musculoskeletal: (persistent 1+ edema BLE).  Stasis changes BLE Skin: Skin is warmand dry. He is not diaphoretic.  Psychiatric: He has a normal mood and affect. His behavior is normal. Judgmentand thought contentnormal.  Neuro: Motor 4+/5 in Bilateral deltoid, biceps, triceps, grip 4-/5 bilaterally hip flexor, knee extensor and ankle dorsiflexor.   Assessment/Plan: 1. 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 Physiatrist is providing close team supervision and 24 hour management of active medical problems listed below. Physiatrist and rehab team continue to assess  barriers to discharge/monitor patient progress toward functional and medical goals.  Function:  Bathing Bathing position   Position: Shower  Bathing parts Body parts bathed by patient: Right arm, Left arm, Chest, Abdomen, Front perineal area, Buttocks, Right upper leg, Left upper leg, Right lower leg, Left lower leg, Back Body parts bathed by helper: Back  Bathing assist Assist Level: Set up      Upper Body Dressing/Undressing Upper body dressing   What is the patient wearing?: Pull over shirt/dress     Pull over shirt/dress - Perfomed by patient: Thread/unthread left sleeve, Put head through opening, Pull shirt over trunk, Thread/unthread right sleeve          Upper body assist Assist Level: More than reasonable time   Set up : To obtain clothing/put away  Lower Body Dressing/Undressing Lower body dressing   What is the patient wearing?: Underwear, Pants, [employer], Shoes Underwear - Performed by patient: Thread/unthread right underwear leg, Thread/unthread left underwear leg, Pull underwear up/down Underwear - Performed by helper: Thread/unthread right underwear leg, Thread/unthread left underwear leg, Pull underwear up/down Pants- Performed by patient: Thread/unthread right pants leg, Thread/unthread left pants leg, Pull pants up/down Pants- Performed by helper: Fasten/unfasten pants           Shoes - Performed by helper: Don/doff right shoe, Don/doff left shoe, Fasten right, Fasten left       TED Hose - Performed by helper: Don/doff right TED hose, Don/doff left TED hose  Lower body assist Assist for lower body dressing: Touching or steadying assistance (Pt > 75%), Assistive device Assistive Device Comment: dressing stick    Toileting  Toileting   Toileting steps completed by patient: Adjust clothing prior to toileting, Performs perineal hygiene, Adjust clothing after toileting Toileting steps completed by helper: Adjust clothing after toileting Toileting Assistive  Devices: Grab bar or rail  Toileting assist Assist level: Set up/obtain supplies   Transfers Chair/bed transfer   Chair/bed transfer method: Stand pivot Chair/bed transfer assist level: Supervision or verbal cues Chair/bed transfer assistive device: Armrests, Medical sales representative     Max distance: 175 Assist level: Supervision or verbal cues   Wheelchair   Type: Manual Max wheelchair distance: 150 Assist Level: Supervision or verbal cues  Cognition Comprehension Comprehension assist level: Follows complex conversation/direction with no assist  Expression Expression assist level: Expresses complex ideas: With no assist  Social Interaction Social Interaction assist level: Interacts appropriately with others with medication or extra time (anti-anxiety, antidepressant).  Problem Solving Problem solving assist level: Solves complex problems: Recognizes & self-corrects  Memory Memory assist level: Complete Independence: No helper   Medical Problem List and Plan: 1. 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  2. DVT Prophylaxis/Anticoagulation: Pharmaceutical: Lovenox 3. Pain Management: [name] 4. Mood: LCSW to follow for evaluation and support.  5. Neuropsych: This patient iscapable of making decisions on hisown behalf. 6. Skin/Wound Care: Routine pressure relief measures. Maintain adequate nutritional and hydration status.  7. Fluids/Electrolytes/Nutrition: Strict I/O. Low salt diet. 8. Acute on chronic diastolic CHF: Strict I/O with daily weights to monitor for overload. On hydralazine, losartan and lasix.   -addnl lasix 10/19-10/22 Filed Weights   10/18/17 0547 10/19/17 0544 10/20/17 0620  Weight: 128.2 kg (282 lb 9.6 oz) 128.5 kg (283 lb 4.8 oz) 128.8 kg (283 lb 14.4 oz)   Slightly improved  on 10/22  -follow up with cards as outpt  -pt will check weight once home and record daily 9. Chronic hypoxia with new diagnosis of severe obstructive airway disease: Off oxygen now.   -activity tolerance showing improvement 10. HTN: monitor BP bid.   Hydralazine increased to 50 mg on 10/21 with some improvement 11. OSA: CPAP daily  12.. Acute on chronic CKD: Will need follow up MRI on outpatient basis. Monitor SCr.  -Cr 1.21 on 10/18, improving 13. 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  14. Citrobacter UTI: completed Antibiotics 15. Hyperglycemia: changed to CM diet. SSI  -dietary ed 16. ABLA  Hemoglobin 11.2 on 10/18  Continue to monitor 17. Diabetes mellitus    Amaryl 1 mg started on 10/21--  to go home on this regimen   -may be able to wean with weight loss/exercise/diet 18. Morbid obesity  Body mass index is 37.46 kg/m.    LOS (Days) 12 A FACE TO FACE EVALUATION WAS PERFORMED  Meredith Staggers, MD 10/20/2017 9:07 AM

## 2017-10-21 NOTE — Progress Notes (Signed)
Social Work Discharge Note  The overall goal for the admission was met for:   Discharge location: Yes - home  Length of Stay: Yes  Discharge activity level: Yes - supervision  Home/community participation: Yes  Services provided included: MD, RD, PT, OT, RN, Pharmacy and SW  Financial Services: Medicare - Blue Medicare  Follow-up services arranged: Outpatient: PT/OT, DME: new bedside commode and OT gave pt info on which to buy and where to purchase and Patient/Family request agency HH: [facility] - pt has been there before, DME: OT gave this info to pt.  CSW could not obtain [facility] for pt as he had already received one within the last 5 years.  He plans to purchase a new one privately.  Comments (or additional information): Pt's wife feels she can do what pt needs done (assist with shoes).  Pt's son will come to get him at d/c as pt's wife has a f/u MD appt.  Pt and wife feel pt is prepared to come home.  Patient/Family verbalized understanding of follow-up arrangements: Yes  Individual responsible for coordination of the follow-up plan: Pt and his wife  Confirmed correct DME delivered: Trey Sailors 10/21/2017    Paxtyn Boyar, [name]

## 2017-10-28 ENCOUNTER — Ambulatory Visit: Payer: Medicare Other

## 2017-10-28 ENCOUNTER — Encounter: Payer: Medicare Other | Admitting: Occupational Therapy

## 2017-10-29 NOTE — Discharge Summary (Signed)
Physician Discharge Summary  Patient ID: HUNTER BACHAR MRN: [id number] DOB/AGE: 1946/08/06 71 y.o.  Admit date: 10/08/2017 Discharge date: 10/20/17  Discharge Diagnoses:  Principal Problem:   Tetraparesis [facility]) Active Problems:   Cervical spondylosis with myelopathy   Chronic combined systolic and diastolic congestive heart failure (HCC)   Debility   Diabetes mellitus type 2 in obese (HCC)   Slow transit constipation   Discharged Condition: Stable  Significant Diagnostic Studies: [name]   Labs:  Basic Metabolic Panel: BMP Latest Ref Rng & Units 10/15/2017 10/08/2017 10/07/2017  Glucose 65 - 99 mg/dL 182(H) 167(H) 142(H)  BUN 6 - 20 mg/dL 26(H) 39(H) 43(H)  Creatinine 0.61 - 1.24 mg/dL 1.21 1.34(H) 1.45(H)  BUN/Creat Ratio 10 - 24 - - -  Sodium 135 - 145 mmol/L 137 138 140  Potassium 3.5 - 5.1 mmol/L 4.3 3.9 3.7  Chloride 101 - 111 mmol/L 103 102 100(L)  CO2 22 - 32 mmol/L 25 28 29   Calcium 8.9 - 10.3 mg/dL 8.9 8.5(L) 8.8(L)    CBC: CBC Latest Ref Rng & Units 10/15/2017 10/08/2017 10/04/2017  WBC 4.0 - 10.5 K/uL 5.2 5.4 9.1  Hemoglobin 13.0 - 17.0 g/dL 11.2(L) 11.2(L) 11.8(L)  Hematocrit 39.0 - 52.0 % 35.1(L) 34.9(L) 37.6(L)  Platelets 150 - 400 K/uL 216 157 147(L)    CBG: No results for input(s): GLUCAP in the last 168 hours.  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 He was treated with IV diuresis for acute on chronic diastolic CHF with improvement but continues to have hypoxia with activity. Cardiology question pulmonary component and PFTs revealed severe obstructive lung disease. He was diuresed of 6 L and renal status has been stable. He has had recent workup revealing severe L4-5 spinal  stenosis and was being referred to Dr. Ronnald Ramp for surgical consideration. He has had decline in mobility as well as decline in ability to carry out ADLs CIR recommended for follow up therapy.    Hospital Course: Stinnett Galvis was admitted to rehab 10/08/2017 for inpatient therapies to consist of PT and OT at least three hours five days a week. Past admission physiatrist, therapy team and rehab RN have worked together to provide customized collaborative inpatient rehab.  Weights were monitored daily and lasix was increased briefly due to upward trend. Hydralazine has been titrated upwards for better control of blood pressure. He was found to have citrobacter UTI and has completed 7 day course antibiotic regimen for this. He is voiding without difficulty and is continent of bowel and bladder.  His respiratory status improved and he was weaned off oxygen. Acute on chronic renal failure has been monitored and has improved with SCr down to 1.21.  Diabetes was monitored with ac/hs cbg checks and Amaryl was added for tighter control.  He has had improvement functional mobility and is currently at supervision level. He will continue to receive follow up outpatient PT and OT at [facility] after discharge.    Rehab course: During patient's stay in rehab weekly team conferences were held to monitor patient's progress, set goals and discuss barriers to discharge. At admission, patient required mod to max assist with ADLs and max assist with mobility. He has had improvement in activity tolerance, balance, postural control, as well as ability to  compensate for deficits. He is has had improvement in functional use RLE  and LLE as well as improved awareness. He is able to complete ADL tasks with supervision. He requires min assist to don shoes and TEDs.  He was able to transfer with supervision and to ambulate 200' with RW. Family education was completed regarding all aspects of care and mobility.      Disposition: 01-Home or Self Care  Diet: Diabetic diet. 2 gram salt.   Special Instructions: 1. Monitor weights daily and follow up with cardiology if weights trend upwards or for SOB. 2. Monitor BS bid.    Discharge Instructions    Ambulatory referral to Physical Medicine Rehab    Complete by:  As directed    1-2 weeks transitional care appt   Ambulatory referral to Physical Medicine Rehab    Complete by:  As directed    Moderate complexity follow-up 1-2 weeks chronic myelopathy/tetraparesis     Discharge Medication List as of 10/20/2017  9:42 AM    CONTINUE these medications which have CHANGED   Details  baclofen (LIORESAL) 10 MG tablet Take 1 tablet (10 mg total) by mouth 3 (three) times daily. For muscle spasms, Starting Tue 10/20/2017, Print    escitalopram (LEXAPRO) 10 MG tablet Take 1 tablet (10 mg total) by mouth daily., Starting Tue 10/20/2017, Print    furosemide (LASIX) 20 MG tablet Take 1 tablet (20 mg total) by mouth 2 (two) times daily., Starting Tue 10/20/2017, Print    gabapentin (NEURONTIN) 400 MG capsule TAKE 1 CAPSULE (400 MG TOTAL) BY MOUTH EVERY 8 (EIGHT) HOURS., Print    glimepiride (AMARYL) 1 MG tablet Take 1 tablet (1 mg total) by mouth daily with breakfast., Starting Tue 10/20/2017, Print    hydrALAZINE (APRESOLINE) 50 MG tablet Take 1 tablet (50 mg total) by mouth every 8 (eight) hours., Starting Tue 10/20/2017, Print    levothyroxine (SYNTHROID, LEVOTHROID) 175 MCG tablet Take 1 tablet (175 mcg total) by mouth daily., Starting Tue 10/20/2017, Print    mometasone-formoterol (DULERA) 200-5 MCG/ACT AERO Inhale 2 puffs into the lungs 2 (two) times daily., Starting Tue 10/20/2017, Print    potassium chloride (KLOR-CON 10) 10 MEQ tablet Take 1 tablet (10 mEq total) by mouth 2 (two) times daily., Starting Tue 10/20/2017, Print    pramipexole (MIRAPEX) 0.5 MG tablet Take 1 tablet (0.5 mg total) by mouth 2 (two) times daily., Starting Tue 10/20/2017,  Print    tamsulosin (FLOMAX) 0.4 MG CAPS capsule Take 1 capsule (0.4 mg total) by mouth daily., Starting Tue 10/20/2017, Print    tiotropium (SPIRIVA HANDIHALER) 18 MCG inhalation capsule Place 1 capsule (18 mcg total) into inhaler and inhale daily., Starting Tue 10/20/2017, Print    TOVIAZ 8 MG TB24 tablet Take 1 tablet (8 mg total) by mouth daily., Starting Tue 10/20/2017, Print    traZODone (DESYREL) 50 MG tablet Take 1 tablet (50 mg total) by mouth at bedtime., Starting Tue 10/20/2017, Print      CONTINUE these medications which have NOT CHANGED   Details  albuterol (PROVENTIL HFA;VENTOLIN HFA) 108 (90 Base) MCG/ACT inhaler Inhale 2 puffs into the lungs every 6 (six) hours as needed for wheezing or shortness of breath., Starting Thu 10/08/2017, Normal    aspirin EC 81 MG EC tablet Take 1 tablet (81 mg total) by mouth daily., Starting Thu 08/23/2015, Print    docusate sodium (COLACE) 100 MG capsule Take 1 capsule (100 mg total) by mouth 2 (two) times daily., Starting Thu  10/08/2017, Normal    polyethylene glycol (MIRALAX / GLYCOLAX) packet Take 17 g by mouth daily as needed. For constipation, Starting Wed 08/09/2014, Normal      STOP taking these medications     cefpodoxime (VANTIN) 200 MG tablet        Follow-up Information    Meredith Staggers, MD Follow up.   Specialty:  Physical Medicine and Rehabilitation Why:  office will call you with follow up appointment Contact information: [address] [place] [address] [phone]        [name], MD. Go on 10/29/2017.   Specialty:  Family Medicine Why:  @ 3:45PM for 4PM appointment.   Call periodically to see if they have had a cancellation for an earlier in the day appointment.  This was all they had. For post hospital follow up and referral to pulmonary doctor.  Contact information: [address] [place] [address] [phone]        Josue Hector, MD Follow up.    Specialty:  Cardiology Why:  Call for appointment Contact information: [address] [place] [address] [phone]           Signed: Bary Leriche 10/29/2017, 10:14 AM

## 2017-10-30 ENCOUNTER — Encounter: Payer: Self-pay | Admitting: Pulmonary Disease

## 2017-10-30 ENCOUNTER — Ambulatory Visit (INDEPENDENT_AMBULATORY_CARE_PROVIDER_SITE_OTHER): Payer: Medicare Other | Admitting: Pulmonary Disease

## 2017-10-30 VITALS — BP 154/86 | HR 72 | Ht 72.0 in | Wt 287.0 lb

## 2017-10-30 DIAGNOSIS — R0600 Dyspnea, unspecified: Secondary | ICD-10-CM

## 2017-10-30 DIAGNOSIS — J984 Other disorders of lung: Secondary | ICD-10-CM

## 2017-10-30 MED ORDER — TIOTROPIUM BROMIDE-OLODATEROL 2.5-2.5 MCG/ACT IN AERS: 2.0000 | INHALATION_SPRAY | Freq: Every day | RESPIRATORY_TRACT | 0 refills | Status: AC

## 2017-10-30 NOTE — Progress Notes (Signed)
Travis [name]    [id number]    09/28/1946  Primary Care Physician:Ehinger, [name], MD  Referring Physician: [name], MD [address] [place], [place] [address]  Chief complaint: Consult for evaluation of COPD  HPI: 71 year old with cervical spondylosis, hypertension, diastolic heart failure, hypertension, stage III chronic kidney disease, OSA, peripheral neuropathy. He was admitted and October 2018 for Citrobacter UTI acute on chronic diastolic heart failure and acute respiratory failure with hypoxia secondary to volume overload.  He responded to IV Lasix, oxygen, antibiotics. Given his smoking history is concern for COPD.  He was started on Dulera, Spiriva and albuterol and referred to pulmonary for further evaluation.  He reports that he has tongue swelling and hoarseness after he started on this inhalers.  He does remember to rinse his mouth after each use. He had a previous admission July 2015 for pneumonia, acute respiratory failure.  He was intubated at that time.   Pets: Used to have dogs.  Currently no pets Occupation: semi retired.  Still works as an Research scientist (life sciences) Exposures[place].  But he does not have any direct exposure to these.  No other exposures Smoking history: 40-pack-year smoking.  Quit in 1986 Travel History: No recent travel  Outpatient Encounter Prescriptions as of 10/30/2017  Medication Sig  . albuterol (PROVENTIL HFA;VENTOLIN HFA) 108 (90 Base) MCG/ACT inhaler Inhale 2 puffs into the lungs every 6 (six) hours as needed for wheezing or shortness of breath.  [name] aspirin EC 81 MG EC tablet Take 1 tablet (81 mg total) by mouth daily.  . baclofen (LIORESAL) 10 MG tablet Take 1 tablet (10 mg total) by mouth 3 (three) times daily. For muscle spasms  . docusate sodium (COLACE) 100 MG capsule Take 1 capsule (100 mg total) by mouth 2 (two) times daily.  [name] escitalopram (LEXAPRO) 10 MG tablet Take 1 tablet  (10 mg total) by mouth daily.  . furosemide (LASIX) 20 MG tablet Take 1 tablet (20 mg total) by mouth 2 (two) times daily.  [name] gabapentin (NEURONTIN) 400 MG capsule TAKE 1 CAPSULE (400 MG TOTAL) BY MOUTH EVERY 8 (EIGHT) HOURS.  [name] glimepiride (AMARYL) 1 MG tablet Take 1 tablet (1 mg total) by mouth daily with breakfast.  . hydrALAZINE (APRESOLINE) 50 MG tablet Take 1 tablet (50 mg total) by mouth every 8 (eight) hours.  [name] levothyroxine (SYNTHROID, LEVOTHROID) 175 MCG tablet Take 1 tablet (175 mcg total) by mouth daily.  [name] losartan (COZAAR) 25 MG tablet Take 50 mg by mouth daily.  . mometasone-formoterol (DULERA) 200-5 MCG/ACT AERO Inhale 2 puffs into the lungs 2 (two) times daily.  . polyethylene glycol (MIRALAX / GLYCOLAX) packet Take 17 g by mouth daily as needed. For constipation (Patient taking differently: Take 17 g by mouth daily as needed for moderate constipation. )  . potassium chloride (KLOR-CON 10) 10 MEQ tablet Take 1 tablet (10 mEq total) by mouth 2 (two) times daily.  . pramipexole (MIRAPEX) 0.5 MG tablet Take 1 tablet (0.5 mg total) by mouth 2 (two) times daily.  . tamsulosin (FLOMAX) 0.4 MG CAPS capsule Take 1 capsule (0.4 mg total) by mouth daily.  [name] tiotropium (SPIRIVA HANDIHALER) 18 MCG inhalation capsule Place 1 capsule (18 mcg total) into inhaler and inhale daily.  . TOVIAZ 8 MG TB24 tablet Take 1 tablet (8 mg total) by mouth daily.  . traZODone (DESYREL) 50 MG tablet Take 1 tablet (  50 mg total) by mouth at bedtime.   No facility-administered encounter medications on file as of 10/30/2017.     Allergies as of 10/30/2017 - Review Complete 10/30/2017  Allergen Reaction Noted  . Other Other (See Comments) 01/28/2012  . Levofloxacin in d5w Itching 03/29/2017    Past Medical History:  Diagnosis Date  . Cervical myelopathy (HCC)    with myelomalacia at C5-6 and C3-4  . CHF (congestive heart failure) ([place])   . Constipation - functional    controlled with miralax  . DJD  (degenerative joint disease)    WHOLE SPINE  . Dyspnea   . Gait disorder   . Hypertension   . Hyperthyroidism    Had Iodine to resolve issue DX 10 YR AGO  . Hypothyroidism    Low d/t treatment of hyperthyroidism  . 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 Peripheral neuropathy   . Pneumonia   . PONV (postoperative nausea and vomiting)   . RBBB   . Renal disorder    acute kidney failure  . Restless legs syndrome 03/14/2015  . RLS (restless legs syndrome)   . Sleep apnea    DX  2009/2010 [facility]  . 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 Type II diabetes mellitus ([place])    diet controlled    Past Surgical History:  Procedure Laterality Date  . BACK SURGERY     6 surgeries  . CARDIAC CATHETERIZATION [name] 09/07/2015   Procedure: Right/Left Heart Cath and Coronary Angiography;  Surgeon: Travis M Martinique, MD;  Location: [facility];  Service: Cardiovascular;  Laterality: [name];  . CHOLECYSTECTOMY    . LUMBAR LAMINECTOMY/DECOMPRESSION MICRODISCECTOMY  07/14/2012   Procedure: LUMBAR LAMINECTOMY/DECOMPRESSION MICRODISCECTOMY 2 LEVELS;  Surgeon: Travis Moore, MD;  Location: [facility];  Service: Neurosurgery;  Laterality: Left;  Lumbar two three laminectomy, left thoracic four five transpedicular diskectomy    Family History  Problem Relation Age of Onset  . Polycythemia Mother   . Diabetes Father   . Hypertension Father   . Heart disease Father   . CAD Unknown        1 of his 4 brothers and father has CAD    Social History   Social History  . Marital status: Married    Spouse name: Travis [name]  . Number of children: 3  . Years of education: 50   Occupational History  .  [employer]    Works in [place] History Main Topics  . Smoking status: Former Smoker    Packs/day: 2.00    Years: 20.00    Types: Cigarettes    Quit date: 06/28/1985  . Smokeless tobacco: Former Systems developer    Quit date: 06/28/1985  . Alcohol use No  . Drug use:  No  . Sexual activity: Not on file   Other Topics Concern  . Not on file   Social History Narrative   Lives with wife   Patient is married with 3 children.   Patient has 16 yrs of education.   Patient is right handed.   Patient drinks 2 cups of caffeine per day.       Review of systems: Review of Systems  Constitutional: Negative for fever and chills.  HENT: Negative.   Eyes: Negative for blurred vision.  Respiratory: as per HPI  Cardiovascular: Negative for chest pain and palpitations.  Gastrointestinal: Negative for vomiting, diarrhea, blood per rectum. Genitourinary: Negative for dysuria, urgency,  frequency and hematuria.  Musculoskeletal: Negative for myalgias, back pain and joint pain.  Skin: Negative for itching and rash.  Neurological: Negative for dizziness, tremors, focal weakness, seizures and loss of consciousness.  Endo/Heme/Allergies: Negative for environmental allergies.  Psychiatric/Behavioral: Negative for depression, suicidal ideas and hallucinations.  All other systems reviewed and are negative.  Physical Exam: Blood pressure (!) 154/86, pulse 72, height 6' (1.829 m), weight 287 lb (130.2 kg), SpO2 92 %. Gen:      No acute distress HEENT:  EOMI, sclera anicteric Neck:     No masses; no thyromegaly Lungs:    Clear to auscultation bilaterally; normal respiratory effort CV:         Regular rate and rhythm; no murmurs Abd:      + bowel sounds; soft, non-tender; no palpable masses, no distension Ext:    No edema; adequate peripheral perfusion Skin:      Warm and dry; no rash Neuro: alert and oriented x 3 Psych: normal mood and affect  Data Reviewed: Chest x-ray 10/06/06-right hemidiaphragm elevation with bilateral basal atelectasis CT angiogram 10/02/17- enlarged pulmonary artery suggestive of pulmonary hypertension, lower lobe atelectasis, right hemidiaphragm elevation.  No pulmonary embolism I have reviewed all images personally  PFTs 10/07/17 FVC 2.19  [45%], FEV1 1.69 (47%], F/F 77, TLC 55%, DLCO 35% Obstruction with severe restriction and diffusion impairment.  CBC 10/02/17-absolute eosinophil count 0 CBC 14/43/15-QMGQQPYP eosinophilic count 950  Assessment:  Consult for evaluation of dyspnea Concern for COPD He has heavy smoking history, quit in 86.  His PFTs show no overt obstruction by F/F criteria however there is concavity to his flow loop and reduction in mid flow suggestive of small airway disease. I do not believe he needs to be on inhaled steroids as he has side effects of tongue swelling and hoarseness.  He does not have elevated eosinophils in the blood I will try him on combination of LABA/LAMA with stiolto.  Restrictive lung disease PFTs also noted for restriction with diffusion impairment that corrects for alveolar volume.  There is no evidence of interstitial lung disease on CT scan He does have right hemidiaphragm elevation dating back to 2007 which may be from his extensive cervical spine surgery. His restriction is likely result of this and also body habitus with obesity.  I have encouraged him to continue to work on weight loss, exercise.  Plan/Recommendations: - Give samples of stiolto inhaler.  Use this instead of Dulera and Spiriva - Continue to work on weight loss, exercise.  Marshell Garfinkel MD [facility] Pager [phone] 10/30/2017, 9:53 AM  CC: Travis Arabian, MD

## 2017-10-30 NOTE — Patient Instructions (Signed)
Try the new inhaler that was giving samples.  Please let me know if this works better for you Remember to rinse your mouth after each use Follow-up 2 months.

## 2017-11-02 ENCOUNTER — Other Ambulatory Visit: Payer: Self-pay | Admitting: Student

## 2017-11-02 DIAGNOSIS — R29898 Other symptoms and signs involving the musculoskeletal system: Secondary | ICD-10-CM

## 2017-11-03 ENCOUNTER — Ambulatory Visit: Payer: Medicare Other | Admitting: Physician Assistant

## 2017-11-03 ENCOUNTER — Encounter: Payer: Self-pay | Admitting: Physician Assistant

## 2017-11-03 VITALS — BP 132/64 | HR 60 | Ht 73.0 in | Wt 290.8 lb

## 2017-11-03 DIAGNOSIS — I5032 Chronic diastolic (congestive) heart failure: Secondary | ICD-10-CM

## 2017-11-03 DIAGNOSIS — J449 Chronic obstructive pulmonary disease, unspecified: Secondary | ICD-10-CM | POA: Diagnosis not present

## 2017-11-03 DIAGNOSIS — I10 Essential (primary) hypertension: Secondary | ICD-10-CM

## 2017-11-03 DIAGNOSIS — N2889 Other specified disorders of kidney and ureter: Secondary | ICD-10-CM | POA: Diagnosis not present

## 2017-11-03 DIAGNOSIS — N183 Chronic kidney disease, stage 3 unspecified: Secondary | ICD-10-CM

## 2017-11-03 NOTE — Progress Notes (Signed)
Cardiology Office Note:    Date:  11/03/2017   ID:  Travis [name], DOB 08/12/1946, MRN [id number]  PCP:  [name], MD  Cardiologist:  Dr. Liam Rogers   Pulmonologist:  Dr. Vaughan Browner  Referring MD: [name], MD   Chief Complaint  Patient presents with  . Hospitalization Follow-up    admitted with UTI, CHF    History of Present Illness:    Travis [name] is a 71 y.o. male with a hx of diastolic heart failure, chronic kidney disease, hypertension, hypothyroidism, sleep apnea, diabetes, prior tobacco abuse, spinal stenosis.  Cardiac catheterization in 2016 demonstrated moderate nonobstructive coronary artery disease and mild pulmonary hypertension.  Last seen by Dr. Acie Fredrickson July 2018.  He was admitted 10/5-10/11.  He initially presented with symptoms of a UTI and possible sepsis.  He was given IV antibiotics and IV fluids.  With hydration, he developed volume excess and required IV diuresis.  He was followed by cardiology.  He did have issues with hypoxia and PFTs demonstrated severe COPD.  Outpatient pulmonology follow-up was recommended.  He did require admission to inpatient rehab 10/11-10/23 due to decreased mobility from spinal stenosis.  Of note, renal ultrasound did demonstrate a left kidney lesion measuring 2.2 x 2 cm.  MRI was recommended.  [name] returns for follow-up.  He is here today with his wife.  Weight at home has been stable.  He has seen a pulmonologist and a new inhaler was recommended.  He did follow-up with primary care and his creatinine was stable at 1.51.  His breathing is overall better.  He denies PND.  He sleeps with a CPAP.  Lower extremity edema has improved.  He denies chest discomfort, syncope.  Prior CV studies:   The following studies were reviewed today:  Echocardiogram 03/06/17 Mild LVH, EF 65-70, normal wall motion, grade 2 diastolic dysfunction, mild LAE  R/L cardiac catheterization 09/07/15 LM 40 LAD distal 40 LCx ostial 50 RCA  proximal 40 EF 55-60 LVEDP 16, mean RA 4, PASP 45, mean PCWP 13  Echo 08/21/15 EF 55-60, normal wall motion, grade 2 diastolic dysfunction, mild LAE  Nuclear stress test 03/19/15 EF 48, no ischemia, low risk  Past Medical History:  Diagnosis Date  . Cervical myelopathy (HCC)    with myelomalacia at C5-6 and C3-4  . CHF (congestive heart failure) ([place])   . Constipation - functional    controlled with miralax  . DJD (degenerative joint disease)    WHOLE SPINE  . Dyspnea   . Gait disorder   . Hypertension   . Hyperthyroidism    Had Iodine to resolve issue DX 10 YR AGO  . Hypothyroidism    Low d/t treatment of hyperthyroidism  . 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 Peripheral neuropathy   . Pneumonia   . PONV (postoperative nausea and vomiting)   . RBBB   . Renal disorder    acute kidney failure  . Restless legs syndrome 03/14/2015  . RLS (restless legs syndrome)   . Sleep apnea    DX  2009/2010 [facility]  . 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 Type II diabetes mellitus ([place])    diet controlled    Past Surgical History:  Procedure Laterality Date  . BACK SURGERY     6 surgeries  . CHOLECYSTECTOMY      Current Medications: Current Meds  Medication Sig  . albuterol (PROVENTIL HFA;VENTOLIN HFA) 108 (90 Base) MCG/ACT inhaler  Inhale 2 puffs into the lungs every 6 (six) hours as needed for wheezing or shortness of breath.  [name] aspirin EC 81 MG EC tablet Take 1 tablet (81 mg total) by mouth daily.  . baclofen (LIORESAL) 10 MG tablet Take 1 tablet (10 mg total) by mouth 3 (three) times daily. For muscle spasms  . docusate sodium (COLACE) 100 MG capsule Take 1 capsule (100 mg total) by mouth 2 (two) times daily.  [name] escitalopram (LEXAPRO) 10 MG tablet Take 1 tablet (10 mg total) by mouth daily.  [name] gabapentin (NEURONTIN) 400 MG capsule TAKE 1 CAPSULE (400 MG TOTAL) BY MOUTH EVERY 8 (EIGHT) HOURS.  [name] glimepiride (AMARYL) 1 MG tablet Take 1 tablet (1 mg total) by  mouth daily with breakfast.  . hydrALAZINE (APRESOLINE) 50 MG tablet Take 1 tablet (50 mg total) by mouth every 8 (eight) hours.  [name] levothyroxine (SYNTHROID, LEVOTHROID) 175 MCG tablet Take 1 tablet (175 mcg total) by mouth daily.  [name] losartan (COZAAR) 25 MG tablet Take 50 mg by mouth daily.  . mometasone-formoterol (DULERA) 200-5 MCG/ACT AERO Inhale 2 puffs into the lungs 2 (two) times daily.  . polyethylene glycol (MIRALAX / GLYCOLAX) packet Take 17 g by mouth daily as needed. For constipation  . potassium chloride (KLOR-CON 10) 10 MEQ tablet Take 1 tablet (10 mEq total) by mouth 2 (two) times daily.  . pramipexole (MIRAPEX) 0.5 MG tablet Take 1 tablet (0.5 mg total) by mouth 2 (two) times daily.  . tamsulosin (FLOMAX) 0.4 MG CAPS capsule Take 1 capsule (0.4 mg total) by mouth daily.  [name] tiotropium (SPIRIVA HANDIHALER) 18 MCG inhalation capsule Place 1 capsule (18 mcg total) into inhaler and inhale daily.  [name] torsemide (DEMADEX) 20 MG tablet Take 20 mg 2 (two) times daily by mouth.  . TOVIAZ 8 MG TB24 tablet Take 1 tablet (8 mg total) by mouth daily.     Allergies:   Other and Levofloxacin in d5w   Social History   Tobacco Use  . Smoking status: Former Smoker    Packs/day: 2.00    Years: 20.00    Pack years: 40.00    Types: Cigarettes    Last attempt to quit: 06/28/1985    Years since quitting: 32.3  . Smokeless tobacco: Former Systems developer    Quit date: 06/28/1985  Substance Use Topics  . Alcohol use: No  . Drug use: No     Family Hx: The patient's family history includes CAD in his unknown relative; Diabetes in his father; Heart disease in his father; Hypertension in his father; Polycythemia in his mother.  ROS:   Please see the history of present illness.    ROS All other systems reviewed and are negative.   EKGs/Labs/Other Test Reviewed:    EKG:  EKG is  ordered today.  The ekg ordered today demonstrates normal sinus rhythm leftward axis, right bundle branch block, QTC 486 ms, no  significant change since prior tracing  Recent Labs: 02/12/2017: NT-Pro BNP 109 10/02/2017: B Natriuretic Peptide 169.8; TSH 0.446 10/08/2017: ALT 58; Magnesium 2.4 10/15/2017: BUN 26; Creatinine, Ser 1.21; Hemoglobin 11.2; Platelets 216; Potassium 4.3; Sodium 137   Recent Lipid Panel Lab Results  Component Value Date/Time   CHOL 124 08/20/2015 02:58 PM   TRIG 82 08/20/2015 02:58 PM   HDL 29 (L) 08/20/2015 02:58 PM   CHOLHDL 4.3 08/20/2015 02:58 PM   LDLCALC 79 08/20/2015 02:58 PM    Physical Exam:    VS:  BP 132/64  Pulse 60   Ht 6\' 1"  (1.854 m)   Wt 290 lb 12.8 oz (131.9 kg)   SpO2 94%   BMI 38.37 kg/m     Wt Readings from Last 3 Encounters:  11/03/17 290 lb 12.8 oz (131.9 kg)  10/30/17 287 lb (130.2 kg)  10/20/17 283 lb 14.4 oz (128.8 kg)     Physical Exam  Constitutional: He is oriented to person, place, and time. He appears well-developed and well-nourished. No distress.  HENT:  Head: Normocephalic and atraumatic.  Neck: No JVD (No JVD at 90 degrees) present.  Cardiovascular: Normal rate and regular rhythm.  No murmur heard. Pulmonary/Chest: Effort normal. He has no rales.  Abdominal: Soft.  Musculoskeletal: He exhibits edema (1+ bilateral lower extremity edema).  Neurological: He is alert and oriented to person, place, and time.  Skin: Skin is warm and dry.    ASSESSMENT:    1. Chronic diastolic CHF (congestive heart failure) (HCC)   2. Stage 3 chronic kidney disease ([place])   3. Chronic obstructive pulmonary disease, unspecified COPD type ([place])   4. Essential hypertension   5. Left kidney mass    PLAN:    In order of problems listed above:  1.  Chronic diastolic CHF (congestive heart failure) ([place])  He is status post recent admission to the hospital of volume excess in the setting of urinary tract infection.  He required IV diuresis and is currently stable from a volume standpoint.  Continue current medical regimen.  2.  Stage 3 chronic kidney  disease ([place]) Follow-up lab work last week with primary care demonstrates stable creatinine.  3.  Chronic obstructive pulmonary disease, unspecified COPD type [facility]) Follow-up with pulmonology as planned.  4.  Essential hypertension The patient's blood pressure is controlled on his current regimen.  Continue current therapy.   5.  Left kidney mass The discharge summary indicates that he had an ultrasound that demonstrated a 2.2 x 2 cm lesion in the left kidney and a follow-up MRI is recommended.  I have asked him to follow-up with his primary care physician to discuss this further.   Dispo:  Return in about 3 months (around 02/03/2018) for Routine Follow Up, w/ Dr. Acie Fredrickson.   Medication Adjustments/Labs and Tests Ordered: Current medicines are reviewed at length with the patient today.  Concerns regarding medicines are outlined above.  Tests Ordered: Orders Placed This Encounter  Procedures  . EKG 12-Lead   Medication Changes: No orders of the defined types were placed in this encounter.   Signed, Richardson Dopp, [name]  11/03/2017 1:41 PM    [facility] [address], [place], [place]  [address] Phone: [phone]; Fax: [phone]

## 2017-11-03 NOTE — Patient Instructions (Addendum)
Medication Instructions:  Your physician recommends that you continue on your current medications as directed. Please refer to the Current Medication list given to you today.   Labwork: NONE ORDERED TODAY  Testing/Procedures: NONE ORDERED TODAY  Follow-Up: 3 MONTHS WITH DR. Acie Fredrickson ON Feb 05, 2018 @ 8:00 AM  Any Other Special Instructions Will Be Listed Below (If Applicable).     If you need a refill on your cardiac medications before your next appointment, please call your pharmacy.

## 2017-11-04 ENCOUNTER — Ambulatory Visit: Payer: Medicare Other | Admitting: Occupational Therapy

## 2017-11-04 ENCOUNTER — Ambulatory Visit: Payer: Medicare Other | Attending: Neurology

## 2017-11-04 DIAGNOSIS — R2689 Other abnormalities of gait and mobility: Secondary | ICD-10-CM | POA: Diagnosis present

## 2017-11-04 DIAGNOSIS — R278 Other lack of coordination: Secondary | ICD-10-CM | POA: Diagnosis present

## 2017-11-04 DIAGNOSIS — M6281 Muscle weakness (generalized): Secondary | ICD-10-CM

## 2017-11-04 DIAGNOSIS — R2681 Unsteadiness on feet: Secondary | ICD-10-CM

## 2017-11-04 NOTE — Therapy (Signed)
[facility] [address] [place], [place], [address] Phone: [phone]   Fax:  [phone]  Occupational Therapy Evaluation  Patient Details  Name: Travis [name] MRN: [id number] Date of Birth: 1946-06-19 Referring Provider: Dr. Donetta Potts   Encounter Date: 11/04/2017  OT End of Session - 11/04/17 1239    Visit Number  1    Number of Visits  13    Date for OT Re-Evaluation  12/18/17    Authorization Type  BC/BS MCR - G code needed    Authorization - Visit Number  1    Authorization - Number of Visits  10    OT Start Time  1100    OT Stop Time  1145    OT Time Calculation (min)  45 min    Activity Tolerance  Patient tolerated treatment well    Behavior During Therapy  [facility] for tasks assessed/performed       Past Medical History:  Diagnosis Date  . Cervical myelopathy (HCC)    with myelomalacia at C5-6 and C3-4  . CHF (congestive heart failure) ([place])   . Constipation - functional    controlled with miralax  . DJD (degenerative joint disease)    WHOLE SPINE  . Dyspnea   . Gait disorder   . Hypertension   . Hyperthyroidism    Had Iodine to resolve issue DX 10 YR AGO  . Hypothyroidism    Low d/t treatment of hyperthyroidism  . 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 Peripheral neuropathy   . Pneumonia   . PONV (postoperative nausea and vomiting)   . RBBB   . Renal disorder    acute kidney failure  . Restless legs syndrome 03/14/2015  . RLS (restless legs syndrome)   . Sleep apnea    DX  2009/2010 [facility] done at [facility]  . 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 Type II diabetes mellitus ([place])    diet controlled    Past Surgical History:  Procedure Laterality Date  . BACK SURGERY     6 surgeries  . CHOLECYSTECTOMY      There were no vitals filed for this visit.  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 PMH: CHF, DM, CKD, spinal stenosis L4/5, COPD    Limitations  Fall risk, CHF     Currently in Pain?  Yes    Pain Score  3     Pain Location  Thoracic    Pain Descriptors / Indicators  Burning    Pain Type  Chronic pain    Pain Onset  More than a month ago    Pain Frequency  Constant    Aggravating Factors   getting up in the morning, again in the evening    Pain Relieving Factors  ice        [facility] OT Assessment - 11/04/17 1110      Assessment   Diagnosis  cervical myelopathy    Referring Provider  Dr. Donetta Potts    Onset Date  -- 2-3 years on progressing LE weakness   2-3 years on progressing LE weakness   Prior Therapy  inpatient rehab      Precautions   Precautions  Fall    Precaution Comments  CHF      Balance Screen   Has the patient fallen in the past 6 months  No    Has the patient had a decrease in activity level because of a fear of falling?   -- See P.T. eval   See P.T. eval     Home  Environment   Bathroom Shower/Tub  Walk-in Shower;Curtain with built in seat and grab bars   with built in seat and grab bars   Additional Comments  Pt lives in 1 story home, ramp to enter. DME: rollator, 3-in-1 commode, cane (3 pt), manual w/c. A/E: leg lifter, LH shoe horn, LH sponge, reacher, sock aide    Lives With  Spouse      Prior Function   Level of Independence  Independent with community mobility with device;Independent with household mobility with device;Independent with basic ADLs except shoes and socks the last month   except shoes and socks the last month   Vocation  Part time employment    Vocation Requirements  (8:30am-1/2:00pm), sits at [employer]    Leisure  Spend time with family (grandbabies)      ADL   Eating/Feeding  Independent    Grooming  Independent    Upper Body Bathing  Modified independent seated   seated   Lower Body Bathing  Modified independent seated   seated   Upper Body Dressing   Increased time difficulty with buttons   difficulty with buttons   Lower Body Dressing  Minimal assistance difficulty w/ button and belt, dependent shoes & socks   difficulty w/ button and belt, dependent shoes & socks   Toilet Transfer  Modified independent    Toileting - Clothing Manipulation  -- difficulty with belt   difficulty with belt   Toileting -  Hygiene  -- difficulty w/ reaching and balance    difficulty w/ reaching and balance    Tub/Shower Transfer  Modified independent      IADL   Shopping  Completely unable to shop    Light Housekeeping  Does not participate in any housekeeping tasks    Meal Prep  Able to complete simple warm meal prep simple stovetop cooking   simple stovetop cooking   Investment banker, corporate own vehicle short distances, wife drives majority of time   short distances, wife drives majority of time   Medication Management  Is responsible for taking medication in correct dosages at correct time    Financial Management  -- wife does for home, husband does for office   wife does for home, husband does for office     Mobility   Mobility Status  Needs assist    Mobility Status Comments  uses rollator, very slow      Written Expression   Dominant Hand  Right    Handwriting  -- denies change   denies change     Vision - History   Baseline Vision  Wears glasses only for reading    Additional Comments  denies change      Activity Tolerance   Activity Tolerance  -- standing for approx. 30 min. w/o rest   standing for approx. 30 min. w/o rest     Observation/Other Assessments   Observations  Denies cognitive changes      Sensation   Light Touch  Appears Intact for bilateral hands grossly w/ light touch   for bilateral hands grossly w/ light touch   Additional Comments  however diminshed slightly in bilateral small finger at tips      Coordination   9 Hole Peg Test  Right;Left  Right 9 Hole Peg Test  43.34 sec    Left 9 Hole Peg Test  38.59  sec      Edema   Edema  none in UE's      AROM   Overall AROM Comments  BUE AROM WNL's      Strength   Overall Strength Comments  BUE MMT grossly 5/5      Hand Function   Right Hand Grip (lbs)  68 lbs    Left Hand Grip (lbs)  74 lbs                        OT Short Term Goals - 11/04/17 1423      OT SHORT TERM GOAL #1   Title  Independent with coordination and putty HEP for Rt hand     Time  3    Period  Weeks    Status  New    [place] Date  11/27/17      OT SHORT TERM GOAL #2   Title  Pt to report greater ease with buttons and belt using task modifications and A/E prn    Time  3    Period  Weeks    Status  New      OT SHORT TERM GOAL #3   Title  Pt to don/doff shoes and socks mod I with A/E prn    Time  3    Period  Weeks    Status  New        OT Long Term Goals - 11/04/17 1424      OT LONG TERM GOAL #1   Title  Pt to stand for hygiene care during toileting w/o LOB mod I level with A/E prn for wiping    Time  6    Period  Weeks    Status  New    [place] Date  12/18/17      OT LONG TERM GOAL #2   Title  Pt to improve Rt hand grip strength by 5 lbs     Baseline  eval = 68 lbs    Time  6    Period  Weeks    Status  New      OT LONG TERM GOAL #3   Title  Pt to improve coordination Rt hand as evidenced by performing 9 hole peg test in 38 sec. or less    Baseline  eval = 43 sec.     Time  6    Period  Weeks    Status  New            Plan - 11/04/17 1417    Clinical Impression Statement  Pt is a 71 y.o. male who presents to outpatient therapy with diagnosis of cervical myelopathy and presents with severely decreased bilateral LE function and strength. Pt requires rollator to walk and very slow w/ ambulation due to LE weakness. Pt also with recent hospitalization for respiratory failure and UTI. Pt would benefit from OT to address functional dynamic standing balance and ambulation for ADLS, mild weakness and coordination deficits in Rt  hand, and increase ease/efficiency, independence, and safety with dressing and toileting.     Occupational Profile and client history currently impacting functional performance  PMH significant which will impact performance including: CHF, spinal stenosis L4/5, as well as chronic myelopathy, peripheral neuropathy, COPD, HTN, DM, CKD    Occupational performance deficits (Please refer to evaluation for details):  ADL's;IADL's;Leisure;Social Participation  Rehab Potential  Fair    Current Impairments/barriers affecting progress:  Severity of deficits and time since onset     OT Frequency  2x / week    OT Duration  6 weeks plus eval   plus eval   OT Treatment/Interventions  Self-care/ADL training;DME and/or AE instruction;Patient/family education;Therapeutic exercises;Therapeutic activities;Functional Mobility Training;Neuromuscular education    Plan  coordination and putty HEP for Rt hand, practice use of A/E for donning/doffing shoes and socks    Clinical Decision Making  Several treatment options, min-mod task modification necessary    Consulted and Agree with Plan of Care  Patient       Patient will benefit from skilled therapeutic intervention in order to improve the following deficits and impairments:  Decreased mobility, Improper body mechanics, Decreased strength, Decreased endurance, Decreased activity tolerance, Decreased knowledge of precautions, Decreased balance, Impaired UE functional use  Visit Diagnosis: Muscle weakness (generalized) - Plan: Ot plan of care cert/re-cert  Other lack of coordination - Plan: Ot plan of care cert/re-cert  Unsteadiness on feet - Plan: Ot plan of care cert/re-cert  G-Codes - 06/21/75 1240    Functional Assessment Tool Used (Outpatient only)  skilled clinical impression/observation with functional mobility/dynamic standing during ADLS    Functional Limitation  Mobility: Walking and moving around    Mobility: Walking and Moving Around Current Status  (E8315)  At least 40 percent but less than 60 percent impaired, limited or restricted    Mobility: Walking and Moving Around Goal Status [id number])  At least 20 percent but less than 40 percent impaired, limited or restricted       Problem List Patient Active Problem List   Diagnosis Date Noted  . 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 Slow transit constipation   . Diabetes mellitus type 2 in obese ([place])   . Tetraparesis ([place]) 10/08/2017  . Debility 10/08/2017  . Stage 3 chronic kidney disease ([place])   . Spinal stenosis of lumbar region   . OSA (obstructive sleep apnea)   . Acute blood loss anemia   . Fever 10/02/2017  . Urinary tract infection without hematuria   . Chronic diastolic CHF (congestive heart failure) ([place])   . Elevated troponin I level   . Type II diabetes mellitus ([place]) 08/19/2015  . HCAP (healthcare-associated pneumonia) 08/19/2015  . Hyperthyroidism 08/19/2015  . Hypothyroid 08/19/2015  . AKI (acute kidney injury) ([place]) 08/19/2015  . Blood poisoning   . Restless legs syndrome 03/14/2015  . DOE (dyspnea on exertion) 03/09/2015  . Sinus bradycardia 09/21/2014  . Thoracic spondylosis with myelopathy 09/13/2014  . Cervical arthritis with myelopathy 07/18/2014  . Constipation - functional   . CAP (community acquired pneumonia) 07/10/2014  . Sepsis ([place]) 07/10/2014  . HTN (hypertension) 07/10/2014  . OSA on CPAP 07/10/2014  . Acute lower UTI 07/10/2014  . Edema leg 07/10/2014  . Acute respiratory failure with hypoxia ([place]) 07/10/2014  . 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 Other vitamin B12 deficiency anemia 07/13/2013  . 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 Polyneuropathy in other diseases classified elsewhere ([place]) 02/17/2013  . Other general symptoms(780.99) 02/17/2013  . Abnormality of gait 02/17/2013  . Cervical spondylosis with myelopathy 02/17/2013  . Back ache 09/15/2012    Carey Bullocks, OTR/L 11/04/2017, 2:28  PM  [facility] [address] [place], [place], [address] Phone: [phone]   Fax:  [phone]  Name: Travis [name] MRN: [id number] Date of Birth: February 28, 1946

## 2017-11-04 NOTE — Therapy (Signed)
[facility] [address] [place], [place], [address] Phone: [phone]   Fax:  [phone]  Physical Therapy Evaluation  Patient Details  Name: Travis [name] MRN: [id number] Date of Birth: [birth year] Referring Provider: Dr. Naaman Plummer   Encounter Date: 11/04/2017  PT End of Session - 11/04/17 1443    Visit Number  1    Number of Visits  17    Date for PT Re-Evaluation  01/03/18    Authorization Type  Medicare and BCBS: G-CODE AND PN EVERY 10TH VISIT    PT Start Time  1020    PT Stop Time  1102    PT Time Calculation (min)  42 min    Equipment Utilized During Treatment  -- min A to S   min A to S   Activity Tolerance  Patient tolerated treatment well    Behavior During Therapy  [facility] for tasks assessed/performed       Past Medical History:  Diagnosis Date  . Cervical myelopathy (HCC)    with myelomalacia at C5-6 and C3-4  . CHF (congestive heart failure) ([place])   . Constipation - functional    controlled with miralax  . DJD (degenerative joint disease)    WHOLE SPINE  . Dyspnea   . Gait disorder   . Hypertension   . Hyperthyroidism    Had Iodine to resolve issue DX 10 YR AGO  . Hypothyroidism    Low d/t treatment of hyperthyroidism  . 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 Peripheral neuropathy   . Pneumonia   . PONV (postoperative nausea and vomiting)   . RBBB   . Renal disorder    acute kidney failure  . Restless legs syndrome 03/14/2015  . RLS (restless legs syndrome)   . Sleep apnea    DX  2009/2010 study done at [facility]  . 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 Type II diabetes mellitus ([place])    diet controlled    Past Surgical History:  Procedure Laterality Date  . BACK SURGERY     6 surgeries  . CHOLECYSTECTOMY      There were no vitals filed for this visit.   Subjective Assessment - 11/04/17 1031    Subjective  Pt reported he was admitted to hospital for urinary tract infection and  then went to IP rehab for his weak LEs (admitted for about 10 days). Pt denied falls in last 6 months. Pt has trouble getting up from sit to stand txf. Pt has appt. with neurosurgeon, as Dr. Jannifer Franklin found "2 pinched nerves" in back, pt has myelogram next Friday to determine if LE weakness is from spinal cord. Pt is scheduled for steroid injections for pt's bakc.     Pertinent History  Cervical myelopathy and spondylosis C5-6, C3-4, tx spine myelopathy T1-2 and T4-5, HTN, CHF, DM, DJD, hyperthyroidism, hypothyroidism, Lx spine spinal stenosis, peripheral neuropathy, R BBB, hx of back surgery, pt currently being seen by neurosurgeon regarding myelopathy    Patient Stated Goals  "Pt would like to get back to the cane."    Currently in Pain?  Yes    Pain Score  3     Pain Location  Back between shoulder blades   between shoulder blades   Pain Orientation  Mid Tx spine area between scapulae   Tx spine area between scapulae   Pain Descriptors / Indicators  Burning    Pain Type  Chronic pain    Pain  Onset  More than a month ago    Pain Frequency  Constant    Aggravating Factors   getting up in the morning is worse, and then again in the evening    Pain Relieving Factors  ice         [facility] PT Assessment - 11/04/17 1036      Assessment   Medical Diagnosis  Cervical myelopathy with tetraparesis/debility, CHF-chronic    Referring Provider  Dr. Naaman Plummer    Onset Date/Surgical Date  10/04/17    Hand Dominance  Right    Prior Therapy  CIR and OP neuro      Precautions   Precautions  Fall    Required Braces or Orthoses  -- B AFOs but does not wear them   B AFOs but does not wear them     Restrictions   Weight Bearing Restrictions  No      Balance Screen   Has the patient fallen in the past 6 months  No    Has the patient had a decrease in activity level because of a fear of falling?   Yes    Is the patient reluctant to leave their home because of a fear of falling?   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 light touch in B LEs grossly. Pt reports N/T (burning) in BLEs.      Coordination   Gross Motor Movements are Fluid and Coordinated  No    Fine Motor Movements are Fluid and Coordinated  Yes    Heel Shin Test  impaired BLE, decreased excursion and speed      Posture/Postural Control   Posture/Postural Control  Postural limitations    Postural Limitations  Rounded Shoulders;Forward head;Flexed trunk      ROM / Strength   AROM / PROM / Strength  AROM;Strength      AROM   Overall AROM   Deficits    Overall AROM Comments  BLE AROM deficits at each joint 2/2 weakness      Strength   Overall Strength  Deficits    Overall Strength Comments  R LE: hip flex: 2/5, knee ext: 3+/5, knee flex: 2/5, R DF: 2/5. LLE: hip flex: 2/5, knee ext: 3+/5, knee flex: 2/5, L DF: 2/5 (less than R DF).       Flexibility   Soft Tissue Assessment /Muscle Length  yes    Hamstrings  B hamstring tightness  Transfers   Transfers  Sit to Stand;Stand to Sit    Sit to Stand  4: Min assist;With upper extremity assist;From  chair/3-in-1    Sit to Stand Details  Tactile cues for initiation;Tactile cues for sequencing;Tactile cues for weight shifting;Tactile cues for posture;Tactile cues for placement;Verbal cues for sequencing;Verbal cues for technique    Sit to Stand Details (indicate cue type and reason)  To/from rollator (raised seat)    Stand to Sit  5: Supervision;With upper extremity assist;To chair/3-in-1    Stand to Sit Details (indicate cue type and reason)  Tactile cues for placement;Tactile cues for weight shifting;Verbal cues for sequencing;Verbal cues for technique;Verbal cues for precautions/safety    Number of Reps  Other reps (comment) 3   3   Comments  Pt also stated he has trouble getting in/out bed.      Ambulation/Gait   Ambulation/Gait  Yes    Ambulation/Gait Assistance  4: Min guard;5: Supervision    Ambulation/Gait Assistance Details  Min guard to S for safety. Cues to improve stride length, B DF, and upright posture.     Ambulation Distance (Feet)  75 Feet    Assistive device  Rollator    Gait Pattern  Step-to pattern;Step-through pattern;Decreased stride length;Decreased hip/knee flexion - right;Decreased hip/knee flexion - left;Decreased dorsiflexion - right;Decreased dorsiflexion - left;Decreased step length - right intermittent step through gait pattern   intermittent step through gait pattern   Ambulation Surface  Level;Indoor    Gait velocity  1.6ft/sec.      Balance   Balance Assessed  Yes      Static Standing Balance   Static Standing - Balance Support  No upper extremity supported    Static Standing - Level of Assistance  5: Stand by assistance    Static Standing - Comment/# of Minutes  Pt performed static standing with feet apart with for 30 sec. Pt experienced incr. postural sway and then required UE support 2/2 impaired balance, fatigue, and weakness.              Objective measurements completed on examination: See above findings.              PT  Education - 11/04/17 1436    Education provided  Yes    Education Details  PT discussed exam findings, POC, frequency, and duration. PT also requested pt notify PT regarding neurosurgeon decision.     Person(s) Educated  Patient    Methods  Explanation    Comprehension  Verbalized understanding       PT Short Term Goals - 11/04/17 1554      PT SHORT TERM GOAL #1   Title  Pt will be independent with home exercise program to address lower extremity and core weakness and balance impairment. TARGET DATE FOR ALL STGS: 12/02/17    Status  New      PT SHORT TERM GOAL #2   Title  Perform BERG and write STG and LTG as indicated.    Status  New      PT SHORT TERM GOAL #3   Title  Pt will amb. 300' over even terrain with LRAD at MOD I level to improve functional mobility.     Status  New      PT SHORT TERM GOAL #4   Title  Pt will improve gait speed with LRAD to >/=1.36ft/sec. to decr. falls risk.        PT Long Term Goals - 11/04/17 1557  PT LONG TERM GOAL #1   Title  Trial amb. with cane and write goal if indicated. TARGET DATE FOR ALL LTGS: 12/30/17    Status  New      PT LONG TERM GOAL #2   Title  Pt will improve gait speed to >/=1.28ft/sec. with LRAD to decr. falls risk.     Status  New      PT LONG TERM GOAL #3   Title  Pt will amb. 600' over even and paved surfaces with LRAD, at MOD I Level, to improve functional mobility.     Status  New      PT LONG TERM GOAL #4   Title  Perform 6MWT as indicated and write goal.              Plan - 11/04/17 1444    Clinical Impression Statement  Pt is a pleasant 71y/o male, familiar to this clinic, presenting with cervical meyolpathy and debility s/p hospitalization for 10 days due to UTI and BLE weakness. Pt currenlty seeing neurosurgeon for pinched nerves in Tx spine and to determine if incr. BLE weakness is 2/2 spinal cord.  Pt's PMH signficant for the following: Cervical myelopathy and spondylosis C5-6, C3-4, tx spine  myelopathy T1-2 and T4-5, HTN, CHF, DM, DJD, hyperthyroidism, hypothyroidism, Lx spine spinal stenosis, peripheral neuropathy, R BBB, hx of back surgery. Pt presented with the following deficits: gait deviations, impaired strength, impaired balance, impaired coordination, B foot N/T, decreased endurance. Tone not note duirng exam, but pt reports tone is present when he does not take baclofen. Pt's gait speed  and poor balance indicate pt is at risk for falls. Pt able to stand with feet apart for 30 sec. prior to requiring UE support 2/2 weakenss and fatigue. Therefore, BERG not assessed. Pt would benefit from skilled PT to improve safety during functional mobility.     History and Personal Factors relevant to plan of care:  pt still works and is active    Clinical Presentation due to:  Cervical myelopathy and spondylosis C5-6, C3-4, tx spine myelopathy T1-2 and T4-5, HTN, CHF, DM, DJD, hyperthyroidism, hypothyroidism, Lx spine spinal stenosis, peripheral neuropathy, R BBB, hx of back surgery, pt currently being seen by neurosurgeon regarding myelopathy    Clinical Decision Making  Moderate    Rehab Potential  Fair    Clinical Impairments Affecting Rehab Potential  see above    PT Frequency  2x / week    PT Duration  8 weeks    PT Treatment/Interventions  ADLs/Self Care Home Management;Biofeedback;Canalith Repostioning;Therapeutic exercise;Therapeutic activities;Functional mobility training;Electrical Stimulation;Stair training;Gait training;DME Instruction;Balance training;Neuromuscular re-education;Patient/family education;Orthotic Fit/Training;Manual techniques;Vestibular    PT Next Visit Plan  Review HEP and add to it.    Consulted and Agree with Plan of Care  Patient       Patient will benefit from skilled therapeutic intervention in order to improve the following deficits and impairments:  Abnormal gait, Decreased endurance, Impaired sensation, Decreased activity tolerance, Decreased knowledge of  use of DME, Decreased strength, Pain, Decreased balance, Decreased mobility, Decreased range of motion, Impaired flexibility, Postural dysfunction(PT will not directly address pain but will monitor closely)  Visit Diagnosis: Other abnormalities of gait and mobility - Plan: PT plan of care cert/re-cert  Muscle weakness (generalized) - Plan: PT plan of care cert/re-cert  Unsteadiness on feet - Plan: PT plan of care cert/re-cert  G-Codes - 40/98/11 1601    Functional Assessment Tool Used (Outpatient Only)  Gait speed: 1.59ft/sec., Amb. with  rollator 52' with min guard to S    Functional Limitation  Mobility: Walking and moving around    Mobility: Walking and Moving Around Current Status [phone])  At least 60 percent but less than 80 percent impaired, limited or restricted    Mobility: Walking and Moving Around Goal Status [phone])  At least 20 percent but less than 40 percent impaired, limited or restricted        Problem List Patient Active Problem List   Diagnosis Date Noted  . 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 Slow transit constipation   . Diabetes mellitus type 2 in obese ([place])   . 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 Debility 10/08/2017  . Stage 3 chronic kidney disease ([place])   . Spinal stenosis of lumbar region   . OSA (obstructive sleep apnea)   . Acute blood loss anemia   . Fever 10/02/2017  . Urinary tract infection without hematuria   . Chronic diastolic CHF (congestive heart failure) ([place])   . Elevated troponin I level   . Type II diabetes mellitus ([place]) 08/19/2015  . HCAP (healthcare-associated pneumonia) 08/19/2015  . Hyperthyroidism 08/19/2015  . Hypothyroid 08/19/2015  . AKI (acute kidney injury) ([place]) 08/19/2015  . Blood poisoning   . Restless legs syndrome 03/14/2015  . DOE (dyspnea on exertion) 03/09/2015  . Sinus bradycardia 09/21/2014  . Thoracic spondylosis with myelopathy 09/13/2014  . Cervical arthritis with myelopathy 07/18/2014  . Constipation -  functional   . CAP (community acquired pneumonia) 07/10/2014  . Sepsis ([place]) 07/10/2014  . HTN (hypertension) 07/10/2014  . OSA on CPAP 07/10/2014  . Acute lower UTI 07/10/2014  . Edema leg 07/10/2014  . Acute respiratory failure with hypoxia ([place]) 07/10/2014  . 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 Other vitamin B12 deficiency anemia 07/13/2013  . 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 Polyneuropathy in other diseases classified elsewhere ([place]) 02/17/2013  . Other general symptoms(780.99) 02/17/2013  . Abnormality of gait 02/17/2013  . Cervical spondylosis with myelopathy 02/17/2013  . Back ache 09/15/2012    Oniya Mandarino L 11/04/2017, 4:03 PM  [facility] [address] [place], [place], [address] Phone: [phone]   Fax:  [phone]  Name: Travis [name] MRN: [id number] Date of Birth: 1946/12/04  Geoffry Paradise, PT,DPT 11/04/17 4:04 PM Phone: [phone] Fax: [phone]

## 2017-11-09 ENCOUNTER — Ambulatory Visit: Payer: Medicare Other | Admitting: Physical Therapy

## 2017-11-09 ENCOUNTER — Ambulatory Visit: Payer: Medicare Other | Admitting: Occupational Therapy

## 2017-11-09 ENCOUNTER — Encounter: Payer: Self-pay | Admitting: Physical Therapy

## 2017-11-09 DIAGNOSIS — M6281 Muscle weakness (generalized): Secondary | ICD-10-CM

## 2017-11-09 DIAGNOSIS — R2689 Other abnormalities of gait and mobility: Secondary | ICD-10-CM

## 2017-11-09 DIAGNOSIS — R2681 Unsteadiness on feet: Secondary | ICD-10-CM

## 2017-11-09 DIAGNOSIS — R278 Other lack of coordination: Secondary | ICD-10-CM

## 2017-11-09 NOTE — Patient Instructions (Signed)
  1. Grip Strengthening (Resistive Putty)   Squeeze putty using thumb and all fingers. Repeat _15-20___ times. Do __2__ sessions per day.   2. Roll putty into tube on table and pinch between each finger and thumb x 10 reps each. (Do ring and small finger together gently)    Coordination Activities  Perform the following activities for 15 minutes 2 times per day with right hand(s).  Rotate ball in fingertips (clockwise and counter-clockwise). Flip cards 1 at a time as fast as you can. Deal cards with your thumb (Hold deck in hand and push card off top with thumb). Rotate card in hand (clockwise and counter-clockwise). Pick up coins, buttons, marbles, dried beans/pasta of different sizes and place in container. Pick up coins one at a time until you get 5-10 in your hand, then move coins from palm to fingertips to stack one at a time.

## 2017-11-09 NOTE — Therapy (Signed)
[facility] [address] [place], [place], [address] Phone: [phone]   Fax:  [phone]  Occupational Therapy Treatment  Patient Details  Name: Travis [name] MRN: [id number] Date of Birth: 01-Mar-1946 Referring Provider: Dr. Donetta Potts   Encounter Date: 11/09/2017  OT End of Session - 11/09/17 0851    Visit Number  2    Number of Visits  13    Date for OT Re-Evaluation  12/18/17    Authorization Type  BC/BS MCR - G code needed    Authorization - Visit Number  2    Authorization - Number of Visits  10    OT Start Time  [phone] pt arrived 10 min. late    OT Stop Time  0845    OT Time Calculation (min)  35 min    Activity Tolerance  Patient tolerated treatment well       Past Medical History:  Diagnosis Date  . Cervical myelopathy (HCC)    with myelomalacia at C5-6 and C3-4  . CHF (congestive heart failure) ([place])   . Constipation - functional    controlled with miralax  . DJD (degenerative joint disease)    WHOLE SPINE  . Dyspnea   . Gait disorder   . Hypertension   . Hyperthyroidism    Had Iodine to resolve issue DX 10 YR AGO  . Hypothyroidism    Low d/t treatment of hyperthyroidism  . 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 Peripheral neuropathy   . Pneumonia   . PONV (postoperative nausea and vomiting)   . RBBB   . Renal disorder    acute kidney failure  . Restless legs syndrome 03/14/2015  . RLS (restless legs syndrome)   . Sleep apnea    DX  2009/2010 study done at [facility]  . 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 Type II diabetes mellitus ([place])    diet controlled    Past Surgical History:  Procedure Laterality Date  . BACK SURGERY     6 surgeries  . CHOLECYSTECTOMY      There were no vitals filed for this visit.  Subjective Assessment - 11/09/17 0813    Subjective   No pain today, just feel weak    Pertinent History  Cervical myelopathy with tetraparesis (2-3 years), recent  hospitalization 10/12/17 for respiratory failure, UTI. PMH: CHF, DM, CKD, spinal stenosis L4/5, COPD    Limitations  Fall risk, CHF     Currently in Pain?  No/denies                   OT Treatments/Exercises (OP) - 11/09/17 0001      ADLs   ADL Comments  Discussed options for tying shoes including use of stool, elastic laces, spyrolaces, or velcro converters and provided handout. Also discussed flexible sock aide may be easier than regular sock aide (as pt reports he cannot use regular sock aide)       Exercises   Exercises  Hand      Hand Exercises   Other Hand Exercises  Pt issued putty HEP and green resistance putty for home use. See pt instructions for details      Fine Motor Coordination   Other Fine Motor Exercises  Pt issued coordination HEP - see pt instructions for details             OT Education - 11/09/17 0820    Education provided  Yes  Education Details  coordination and putty HEP    Person(s) Educated  Patient    Methods  Explanation;Demonstration;Handout    Comprehension  Verbalized understanding;Returned demonstration       OT Short Term Goals - 11/04/17 1423      OT SHORT TERM GOAL #1   Title  Independent with coordination and putty HEP for Rt hand     Time  3    Period  Weeks    Status  New    Target Date  11/27/17      OT SHORT TERM GOAL #2   Title  Pt to report greater ease with buttons and belt using task modifications and A/E prn    Time  3    Period  Weeks    Status  New      OT SHORT TERM GOAL #3   Title  Pt to don/doff shoes and socks mod I with A/E prn    Time  3    Period  Weeks    Status  New        OT Long Term Goals - 11/04/17 1424      OT LONG TERM GOAL #1   Title  Pt to stand for hygiene care during toileting w/o LOB mod I level with A/E prn for wiping    Time  6    Period  Weeks    Status  New    Target Date  12/18/17      OT LONG TERM GOAL #2   Title  Pt to improve Rt hand grip strength by 5 lbs      Baseline  eval = 68 lbs    Time  6    Period  Weeks    Status  New      OT LONG TERM GOAL #3   Title  Pt to improve coordination Rt hand as evidenced by performing 9 hole peg test in 38 sec. or less    Baseline  eval = 43 sec.     Time  6    Period  Weeks    Status  New            Plan - 11/09/17 4627    Clinical Impression Statement  Pt tolerating putty and coordination HEP with difficulty Rt hand for some coordination ex's. Pt most limited in mobility and LE weakness    Rehab Potential  Fair    Current Impairments/barriers affecting progress:  Severity of deficits and time since onset     OT Frequency  2x / week    OT Duration  6 weeks    OT Treatment/Interventions  Self-care/ADL training;DME and/or AE instruction;Patient/family education;Therapeutic exercises;Therapeutic activities;Functional Mobility Training;Neuromuscular education    Plan  practice LE dressing (socks and shoes), ? use of stool, practice crossing legs if able    Consulted and Agree with Plan of Care  Patient       Patient will benefit from skilled therapeutic intervention in order to improve the following deficits and impairments:  Decreased mobility, Improper body mechanics, Decreased strength, Decreased endurance, Decreased activity tolerance, Decreased knowledge of precautions, Decreased balance, Impaired UE functional use  Visit Diagnosis: Other lack of coordination  Muscle weakness (generalized)  Unsteadiness on feet    Problem List Patient Active Problem List   Diagnosis Date Noted  . 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 Slow transit constipation   . Diabetes mellitus type 2 in obese ([place])   . 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  Debility 10/08/2017  . Stage 3 chronic kidney disease ([place])   . Spinal stenosis of lumbar region   . OSA (obstructive sleep apnea)   . Acute blood loss anemia   . Fever 10/02/2017  . Urinary tract infection without hematuria   . Chronic diastolic CHF  (congestive heart failure) ([place])   . Elevated troponin I level   . Type II diabetes mellitus ([place]) 08/19/2015  . HCAP (healthcare-associated pneumonia) 08/19/2015  . Hyperthyroidism 08/19/2015  . Hypothyroid 08/19/2015  . AKI (acute kidney injury) ([place]) 08/19/2015  . Blood poisoning   . Restless legs syndrome 03/14/2015  . DOE (dyspnea on exertion) 03/09/2015  . Sinus bradycardia 09/21/2014  . Thoracic spondylosis with myelopathy 09/13/2014  . Cervical arthritis with myelopathy 07/18/2014  . Constipation - functional   . CAP (community acquired pneumonia) 07/10/2014  . Sepsis ([place]) 07/10/2014  . HTN (hypertension) 07/10/2014  . OSA on CPAP 07/10/2014  . Acute lower UTI 07/10/2014  . Edema leg 07/10/2014  . Acute respiratory failure with hypoxia ([place]) 07/10/2014  . 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 Other vitamin B12 deficiency anemia 07/13/2013  . 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 Polyneuropathy in other diseases classified elsewhere ([place]) 02/17/2013  . Other general symptoms(780.99) 02/17/2013  . Abnormality of gait 02/17/2013  . Cervical spondylosis with myelopathy 02/17/2013  . Back ache 09/15/2012    Carey Bullocks, OTR/L 11/09/2017, 8:53 AM  [facility] [address] [place], [place], [address] Phone: [phone]   Fax:  [phone]  Name: ABRAHAM ENTWISTLE MRN: [id number] Date of Birth: September 13, 1946

## 2017-11-09 NOTE — Therapy (Signed)
[facility] [address] [place], [place], [address] Phone: [phone]   Fax:  [phone]  Physical Therapy Treatment  Patient Details  Name: Travis [name] MRN: [id number] Date of Birth: 1946-11-17 Referring Provider: Dr. Naaman Plummer   Encounter Date: 11/09/2017  PT End of Session - 11/09/17 0903    Visit Number  2    Number of Visits  17    Date for PT Re-Evaluation  01/03/18    Authorization Type  Medicare and BCBS: G-CODE AND PN EVERY 10TH VISIT    PT Start Time  0850    PT Stop Time  0930    PT Time Calculation (min)  40 min    Equipment Utilized During Treatment  -- min A to S    Activity Tolerance  Patient tolerated treatment well    Behavior During Therapy  [facility] for tasks assessed/performed       Past Medical History:  Diagnosis Date  . Cervical myelopathy (HCC)    with myelomalacia at C5-6 and C3-4  . CHF (congestive heart failure) ([place])   . Constipation - functional    controlled with miralax  . DJD (degenerative joint disease)    WHOLE SPINE  . Dyspnea   . Gait disorder   . Hypertension   . Hyperthyroidism    Had Iodine to resolve issue DX 10 YR AGO  . Hypothyroidism    Low d/t treatment of hyperthyroidism  . 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 Peripheral neuropathy   . Pneumonia   . PONV (postoperative nausea and vomiting)   . RBBB   . Renal disorder    acute kidney failure  . Restless legs syndrome 03/14/2015  . RLS (restless legs syndrome)   . Sleep apnea    DX  2009/2010 study done at [facility]  . 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 Type II diabetes mellitus ([place])    diet controlled    Past Surgical History:  Procedure Laterality Date  . BACK SURGERY     6 surgeries  . CHOLECYSTECTOMY      There were no vitals filed for this visit.  Subjective Assessment - 11/09/17 0957    Subjective  Pt reports performing some exercises at home from previous HEP: Hip  abduction/extension, squats, and toe lifts. No new complaints, no new falls or pain to report.     Pertinent History  Cervical myelopathy and spondylosis C5-6, C3-4, tx spine myelopathy T1-2 and T4-5, HTN, CHF, DM, DJD, hyperthyroidism, hypothyroidism, Lx spine spinal stenosis, peripheral neuropathy, R BBB, hx of back surgery, pt currently being seen by neurosurgeon regarding myelopathy    Patient Stated Goals  "Pt would like to get back to the cane."    Currently in Pain?  No/denies       [facility] Adult PT Treatment/Exercise - 11/09/17 0907      Ambulation/Gait   Ambulation/Gait  Yes    Ambulation/Gait Assistance  4: Min guard;5: Supervision    Ambulation/Gait Assistance Details  Pt struggled to get up to RW with increased time, Pt required cues for posture and to straighten feet out, pt fatigues quickly with gait. Pt presents with limited DF with BLE's but more so with LLE, very poor foot clearance.     Ambulation Distance (Feet)  50 Feet    Assistive device  Rolling walker    Gait Pattern  Step-to pattern;Decreased stride length;Decreased hip/knee flexion - left;Decreased hip/knee flexion - right;Decreased dorsiflexion - right;Decreased  dorsiflexion - left;Poor foot clearance - left;Poor foot clearance - right;Trunk flexed    Ambulation Surface  Level;Indoor      Exercises   Exercises  Knee/Hip;Ankle      Knee/Hip Exercises: Standing   Hip Abduction  AROM;Stengthening;Both;Knee straight;2 sets;Limitations;Other (comment) Supine     Abduction Limitations  Pt in supine wtih towel under foot, cues to breathe and to point toes straight up      Knee/Hip Exercises: Seated   Long Arc Quad  AROM;Strengthening;Both;2 sets;10 reps;Limitations;Other (comment) Supine    Long Arc Quad Limitations  Pt in supine with blue boulster under knees, cues on slow/controlled descent and breathing.     Clamshell with TheraBand  Yellow Supine, x10 reps/2 sets, cues on technique and slow movement    Marching   AROM;Strengthening;Both;2 sets;10 reps;Limitations supine    Marching Limitations  Pt in supine, cues to bring knees up higher and to breathe through each rep.       Knee/Hip Exercises: Supine   Bridges  AROM;Strengthening;Both;2 sets;10 reps;Limitations    Bridges Limitations  Pt in supine, cues on technique and to breathe, pt only able to bring hips off of mat a few inches but increase height by the last rep.     Straight Leg Raises  AAROM;Strengthening;Both;2 sets;10 reps;Limitations    Straight Leg Raises Limitations  Pt in supine, AAROM for slow descent, pt able to lift leg a few inches off mat without therapist help.     Other Supine Knee/Hip Exercises  --    Other Supine Knee/Hip Exercises  --      Ankle Exercises: Supine   T-Band  Pt in supine performing DF while therapist holds YTB x10reps/2 sets BLE's. Pt tolerated this well, cues to point toes straight up.         PT Education - 11/09/17 0958    Education provided  Yes    Education Details  HEP given    Person(s) Educated  Patient    Methods  Explanation;Demonstration;Handout    Comprehension  Verbalized understanding;Returned demonstration       PT Short Term Goals - 11/09/17 0958      PT SHORT TERM GOAL #1   Title  Pt will be independent with home exercise program to address lower extremity and core weakness and balance impairment. TARGET DATE FOR ALL STGS: 12/02/17    Status  New      PT SHORT TERM GOAL #2   Title  Perform BERG and write STG and LTG as indicated.    Status  New      PT SHORT TERM GOAL #3   Title  Pt will amb. 300' over even terrain with LRAD at MOD I level to improve functional mobility.     Status  New      PT SHORT TERM GOAL #4   Title  Pt will improve gait speed with LRAD to >/=1.78ft/sec. to decr. falls risk.        PT Long Term Goals - 11/09/17 0277      PT LONG TERM GOAL #1   Title  Trial amb. with cane and write goal if indicated. TARGET DATE FOR ALL LTGS: 12/30/17    Status  New       PT LONG TERM GOAL #2   Title  Pt will improve gait speed to >/=1.58ft/sec. with LRAD to decr. falls risk.     Status  New      PT LONG TERM GOAL #3  Title  Pt will amb. 600' over even and paved surfaces with LRAD, at MOD I Level, to improve functional mobility.     Status  New      PT LONG TERM GOAL #4   Title  Perform 6MWT as indicated and write goal.             Plan - 11/09/17 1000    Clinical Impression Statement  Pt tolerated treatment well with no limitation due to pain but some limitation due to fatigue. Pt required multiple rest breaks while performing exercises on mat. Todays session focused on LE strengthening and creating and appropriate home exercise plan. Pt would benefit from continued PT sessions to progress towards goals.     Rehab Potential  Fair    Clinical Impairments Affecting Rehab Potential  see above    PT Frequency  2x / week    PT Duration  8 weeks    PT Treatment/Interventions  ADLs/Self Care Home Management;Biofeedback;Canalith Repostioning;Therapeutic exercise;Therapeutic activities;Functional mobility training;Electrical Stimulation;Stair training;Gait training;DME Instruction;Balance training;Neuromuscular re-education;Patient/family education;Orthotic Fit/Training;Manual techniques;Vestibular    PT Next Visit Plan  Review HEP and add to it. Focus on BLE strenghtening on mat and postural strengthening with theraband.     Consulted and Agree with Plan of Care  Patient       Patient will benefit from skilled therapeutic intervention in order to improve the following deficits and impairments:  Abnormal gait, Decreased endurance, Impaired sensation, Decreased activity tolerance, Decreased knowledge of use of DME, Decreased strength, Pain, Decreased balance, Decreased mobility, Decreased range of motion, Impaired flexibility, Postural dysfunction(PT will not directly address pain but will monitor closely)  Visit Diagnosis: Muscle weakness  (generalized)  Unsteadiness on feet  Other abnormalities of gait and mobility     Problem List Patient Active Problem List   Diagnosis Date Noted  . 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 Slow transit constipation   . Diabetes mellitus type 2 in obese ([place])   . 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 Debility 10/08/2017  . Stage 3 chronic kidney disease ([place])   . Spinal stenosis of lumbar region   . OSA (obstructive sleep apnea)   . Acute blood loss anemia   . Fever 10/02/2017  . Urinary tract infection without hematuria   . Chronic diastolic CHF (congestive heart failure) ([place])   . Elevated troponin I level   . Type II diabetes mellitus ([place]) 08/19/2015  . HCAP (healthcare-associated pneumonia) 08/19/2015  . Hyperthyroidism 08/19/2015  . Hypothyroid 08/19/2015  . AKI (acute kidney injury) ([place]) 08/19/2015  . Blood poisoning   . Restless legs syndrome 03/14/2015  . DOE (dyspnea on exertion) 03/09/2015  . Sinus bradycardia 09/21/2014  . Thoracic spondylosis with myelopathy 09/13/2014  . Cervical arthritis with myelopathy 07/18/2014  . Constipation - functional   . CAP (community acquired pneumonia) 07/10/2014  . Sepsis ([place]) 07/10/2014  . HTN (hypertension) 07/10/2014  . OSA on CPAP 07/10/2014  . Acute lower UTI 07/10/2014  . Edema leg 07/10/2014  . Acute respiratory failure with hypoxia ([place]) 07/10/2014  . 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 Other vitamin B12 deficiency anemia 07/13/2013  . 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 Polyneuropathy in other diseases classified elsewhere ([place]) 02/17/2013  . Other general symptoms(780.99) 02/17/2013  . Abnormality of gait 02/17/2013  . Cervical spondylosis with myelopathy 02/17/2013  . Back ache 09/15/2012   Rei Medlen, SPTA  Anayeli Arel 11/09/2017, 11:31 AM  [facility] [address]  [place], [place], [address] Phone:  [phone]   Fax:  [phone]  Name: Travis [name] MRN: [id number] Date of Birth: [birth year]

## 2017-11-09 NOTE — Patient Instructions (Addendum)
Bridge    Lie back, legs bent. Inhale, lifting hips up from surface. Exhale, slowly lower hips back down. Repeat __10__ times. Do __1-2__ sessions per day.  http://pm.exer.us/54   Copyright  VHI. All rights reserved.  ABDUCTION: Supine - Powder Board (Active - Assistance)    Lie on back, legs straight. Slide right leg out to the side as far as possible. Continue same exercise with left leg. Repeat _10__ times. Perform _1-2__ sets per day.  Copyright  VHI. All rights reserved.  Knee to Chest: Advanced    Lie with one leg straight, one bent. Bring bent knee up. Be sure hips does not tilt or rotate. Slowly lower back down. Repeat with other leg. Do _10__ sets, _1-2__ times per day.  http://ss.exer.us/10   Copyright  VHI. All rights reserved.   Adductor Stretch: Two Angle Pose, Reclined    Place yellow band around knees. Place soles of feet together and let knees relax toward floor. Bring knees back in slowly.  Repeat __10__ times. Perform 1-2 sets per day.   Copyright  VHI. All rights reserved.

## 2017-11-11 ENCOUNTER — Encounter: Payer: Self-pay | Admitting: Neurology

## 2017-11-11 ENCOUNTER — Ambulatory Visit: Payer: Medicare Other | Admitting: Neurology

## 2017-11-11 VITALS — BP 132/63 | HR 72 | Ht 73.0 in | Wt 293.0 lb

## 2017-11-11 DIAGNOSIS — Z9989 Dependence on other enabling machines and devices: Secondary | ICD-10-CM

## 2017-11-11 DIAGNOSIS — I452 Bifascicular block: Secondary | ICD-10-CM

## 2017-11-11 DIAGNOSIS — G8220 Paraplegia, unspecified: Secondary | ICD-10-CM

## 2017-11-11 DIAGNOSIS — G4719 Other hypersomnia: Secondary | ICD-10-CM | POA: Diagnosis not present

## 2017-11-11 DIAGNOSIS — M4712 Other spondylosis with myelopathy, cervical region: Secondary | ICD-10-CM | POA: Insufficient documentation

## 2017-11-11 DIAGNOSIS — G4733 Obstructive sleep apnea (adult) (pediatric): Secondary | ICD-10-CM

## 2017-11-11 NOTE — Progress Notes (Signed)
SLEEP MEDICINE CLINIC   Provider:  Larey Seat, M D  Primary Care Physician:  [name], MD   Referring Provider: Dr. Lala Lund. Willis,M.D.   Chief Complaint  Patient presents with  . New Patient (Initial Visit)    rm 10, with wife, pt is on cpap, been using it for years, pt is unsure of the DME    HPI:  Travis [name] is a 71 y.o. male , seen here as internal referral by Dr. Jannifer Franklin, his primary neurologist.  Travis [name] is a 71 year old married Caucasian right-handed gentleman who presents for evaluation of a possible sleep disorder.  He states that he underwent a sleep study at [facility] many years ago is not sure when.  The patient is followed in this office by Dr. Jannifer Franklin for cervical spondylosis with myelopathy, which has affected gait, caused him to frequently fall, and he has developed bilateral foot drop.   He has seen Dr. Ronnald Ramp as a neurosurgeon.   He has undergone physical therapy earlier this year.  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  It appears the study was done in 2009 at [facility].  The patient also has type 2 diabetes mellitus on medication- not insulin.   Chief complaint according to patient : " I will fall asleep any time of day anywhere"   Sleep habits are as follows: The patient goes to bed around 9:30 PM usually is asleep about 5-10 minutes later.  He describes his bedroom as conducive to sleep, cool, quiet and dark.  [name] reports that the TV is usually running in the bedroom at the time her husband goes to bed and falls asleep.  It is said to a sleep timer of 20 minutes or so. patient sleeps supine, on one pillow only. He denies shortness of breath at night.  He can usually sleep for about  4-5 hours before his first bathroom break, he takes torsemide at night.  Altogether he may have for bathroom breaks at night at the most, but these to fragmented sleep.  He can go back to sleep usually. The patient does not experience enactment of dreams, nightmares, night terrors or sleepwalking.  He arises around 6 AM, spontaneously waking up at that time. He estimates that he gets about 7 hours of nocturnal sleep total.  How he feels in the morning is variable some nights he wakes up after a restless sleep and feels not rested other nights he may be refreshed and restored.  He does wake with a dry mouth, no morning headaches are described.   Sleep medical history and family sleep history: The patient was diagnosed with OSA about 10 years ago at [facility] and his treating physician felt that he should use CPAP.  He has used CPAP ever since. He used to snore horribly before CPAP, and his wife frequently witnessed apnea.    Social history: former smoker- 1986, no ETOH use, caffeine : none regularly .  Semi-Retired from Pharmacist, community , married- children 46, 1 and 22. All healthy  No shift work history,   Review of Systems: Out of a complete 14 system review, the patient complains of only the following symptoms, and all other reviewed systems are negative. Foot drop, impaired immobility, hig fall risk.  Epworth score 20/ 24  , Fatigue severity score n/a  , depression score n/a    Social History   Socioeconomic History  . Marital status: Married    Spouse name: carolyn  . Number of children: 3  . Years of education: 11  . Highest education level: Not on file  Social Needs  . Financial resource strain: Not on file  . Food insecurity - worry: Not on file  . Food insecurity - inability: Not on file  . Transportation needs - medical: Not on file  . Transportation needs - non-medical: Not on file  Occupational History    Employer: [employer]    Comment:  Works in [place] Use  . Smoking status: Former Smoker    Packs/day: 2.00    Years: 20.00    Pack years: 40.00    Types: Cigarettes    Last attempt to quit: 06/28/1985    Years since quitting: 32.3  . Smokeless tobacco: Former Systems developer    Quit date: 06/28/1985  Substance and Sexual Activity  . Alcohol use: No  . Drug use: No  . Sexual activity: Not on file  Other Topics Concern  . Not on file  Social History Narrative   Lives with wife   Patient is married with 3 children.   Patient has 16 yrs of education.   Patient is right handed.   Patient drinks 2 cups of caffeine per day.    Family History  Problem Relation Age of Onset  . Polycythemia Mother   . Diabetes Father   . Hypertension Father   . Heart disease Father   . CAD Unknown        1 of his 4 brothers and father has CAD    Past Medical History:  Diagnosis Date  . Cervical myelopathy (HCC)    with myelomalacia at C5-6 and C3-4  . CHF (congestive heart failure) ([place])   . Constipation - functional    controlled with miralax  . DJD (degenerative joint disease)    WHOLE SPINE  . Dyspnea   . Gait disorder   . Hypertension   . Hyperthyroidism    Had Iodine to resolve issue DX 10 YR AGO  . Hypothyroidism    Low d/t treatment of hyperthyroidism  . 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 Peripheral neuropathy   . Pneumonia   . PONV (postoperative nausea and vomiting)   . RBBB   . Renal disorder    acute kidney failure  . Restless legs syndrome 03/14/2015  . RLS (restless legs syndrome)   . Sleep apnea    DX  2009/2010 study done at [facility]  . 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 Type II diabetes mellitus ([place])    diet controlled    Past Surgical History:  Procedure Laterality Date  . BACK SURGERY     6 surgeries  . CHOLECYSTECTOMY      Current Outpatient Medications  Medication Sig Dispense Refill  . albuterol (PROVENTIL HFA;VENTOLIN HFA) 108 (90 Base) MCG/ACT inhaler Inhale 2 puffs  into the lungs every 6 (six) hours as needed for wheezing or shortness of breath. 1 Inhaler 2  . aspirin EC 81 MG EC tablet Take 1 tablet (81 mg total) by mouth daily. 30 tablet 0  . baclofen (LIORESAL) 10 MG tablet Take 1 tablet (10 mg total) by mouth 3 (three) times daily. For muscle spasms 90 each 1  . escitalopram (LEXAPRO) 10 MG  tablet Take 1 tablet (10 mg total) by mouth daily. 30 tablet 2  . gabapentin (NEURONTIN) 400 MG capsule TAKE 1 CAPSULE (400 MG TOTAL) BY MOUTH EVERY 8 (EIGHT) HOURS. 90 capsule 0  . glimepiride (AMARYL) 1 MG tablet Take 1 tablet (1 mg total) by mouth daily with breakfast. 30 tablet 0  . hydrALAZINE (APRESOLINE) 50 MG tablet Take 1 tablet (50 mg total) by mouth every 8 (eight) hours. 90 tablet 0  . levothyroxine (SYNTHROID, LEVOTHROID) 175 MCG tablet Take 1 tablet (175 mcg total) by mouth daily. 30 tablet 2  . losartan (COZAAR) 25 MG tablet Take 50 mg by mouth daily.    . mometasone-formoterol (DULERA) 200-5 MCG/ACT AERO Inhale 2 puffs into the lungs 2 (two) times daily. 1 Inhaler 0  . polyethylene glycol (MIRALAX / GLYCOLAX) packet Take 17 g by mouth daily as needed. For constipation 14 each 0  . potassium chloride (KLOR-CON 10) 10 MEQ tablet Take 1 tablet (10 mEq total) by mouth 2 (two) times daily. 180 tablet 3  . pramipexole (MIRAPEX) 0.5 MG tablet Take 1 tablet (0.5 mg total) by mouth 2 (two) times daily. 60 tablet 0  . tamsulosin (FLOMAX) 0.4 MG CAPS capsule Take 1 capsule (0.4 mg total) by mouth daily. 30 capsule 0  . tiotropium (SPIRIVA HANDIHALER) 18 MCG inhalation capsule Place 1 capsule (18 mcg total) into inhaler and inhale daily. 30 capsule 0  . torsemide (DEMADEX) 20 MG tablet Take 20 mg 2 (two) times daily by mouth.    . TOVIAZ 8 MG TB24 tablet Take 1 tablet (8 mg total) by mouth daily. 30 tablet 11   No current facility-administered medications for this visit.     Allergies as of 11/11/2017 - Review Complete 11/11/2017  Allergen Reaction Noted  .  Other Other (See Comments) 01/28/2012  . Levofloxacin in d5w Itching 03/29/2017    Vitals: BP 132/63   Pulse 72   Ht 6\' 1"  (1.854 m)   Wt 293 lb (132.9 kg)   BMI 38.66 kg/m  Last Weight:  Wt Readings from Last 1 Encounters:  11/11/17 293 lb (132.9 kg)   VOJ:JKKX mass index is 38.66 kg/m.     Last Height:   Ht Readings from Last 1 Encounters:  11/11/17 6\' 1"  (1.854 m)    Physical exam:  General: The patient is awake, alert and appears not in acute distress. The patient is well groomed. Head: Normocephalic, atraumatic. Neck is supple. Mallampati 5,  neck circumference:21. Nasal airflow patent. Cardiovascular:  Regular rate and rhythm  without  murmurs or carotid bruit, and without distended neck veins. Respiratory: Lungs are clear to auscultation. Skin:  Without evidence of edema, or rash Trunk: BMI is 39   Neurologic exam :  Speech is fluent, without  dysarthria, but with dysphonia .  Hoarseness. Mood and affect are appropriate.  Cranial nerves: Pupils are equal and briskly reactive to light. Funduscopic exam without evidence of pallor or edema. Full Extraocular movements  in vertical and horizontal planes intact  without nystagmus. Visual fields by finger perimetry are intact. Hearing to finger rub intact.   Facial sensation intact to fine touch.  Facial motor strength is symmetric and tongue and uvula move midline, no fasciculation. Shoulder shrug was symmetrical.   Motor exam:  Normal tone, muscle bulk and symmetric strength in upper extremities, but he has lost some grip strength.  Deep tendon reflexes: in the  upper and lower extremities are symmetric and intact.   Assessment:  After physical and neurologic examination, review of laboratory studies,  Personal review of imaging studies, reports of other /same  Imaging studies, results of polysomnography and / or neurophysiology testing and pre-existing records as far as provided in visit., my assessment is:  Although  Mr. Panico would qualify for an inpatient split-night polysomnography protocol, it is due to his limited ambulatory capacity and frequent nocturia that he would much prefer being evaluated at home.  I would therefore petition Medicare to allow this patient to have a home sleep test in his familiar environment.  Based on his comorbidities we do need a sleep study to determine again to which degree apnea is present, and at what kind of pressure his new CPAP would have to be set. Should his home sleep test comes through and show significant complex apnea with intermittent central apnea, significant hypoxemia, or tachybradycardia arrhythmia cardiac wise, I would ask the patient to return to the lab for the CPAP titration.  1)  EDS- with OSA - likely still present, manifesting on CPAP therapy at Epworth 20-24   2)  Co morbidities-  RBBB, myelopathy, foot drop and loss of grip strength. Obesity and DM, nocturia, HTN. Thyroid disease. All these can contribute to sleepiness and fatigue.      The patient was advised of the nature of the diagnosed disorder , the treatment options and the  risks for general health and wellness arising from not treating the condition.   I spent more than 7minutes of face to face time with the patient.  Greater than 50% of time was spent in counseling and coordination of care. We have discussed the diagnosis and differential and I answered the patient's questions.    Plan:  Treatment plan and additional workup :  I would prefer a SPLIT night PSG, but the patient needs assistance in ADL, gait impaired, high fall risk. Therefor HST, I would like to use the watch PAT> RV with me or Robin, if positive.   Larey Seat, MD 47/99/8721, 5:87 PM  Certified in Neurology by ABPN Certified in [place] by [facility] [address], [place], [place] [address]

## 2017-11-11 NOTE — Patient Instructions (Signed)

## 2017-11-12 ENCOUNTER — Ambulatory Visit
Admission: RE | Admit: 2017-11-12 | Discharge: 2017-11-12 | Disposition: A | Discharge status: Home / Self Care | Payer: Medicare Other | Source: Ambulatory Visit | Attending: Student | Admitting: Student

## 2017-11-12 VITALS — BP 149/76 | HR 75

## 2017-11-12 DIAGNOSIS — M4807 Spinal stenosis, lumbosacral region: Secondary | ICD-10-CM

## 2017-11-12 DIAGNOSIS — R29898 Other symptoms and signs involving the musculoskeletal system: Secondary | ICD-10-CM

## 2017-11-12 MED ORDER — IOPAMIDOL (ISOVUE-M 200) INJECTION 41%
15.0000 mL | Freq: Once | INTRAMUSCULAR | Status: AC
  Administered 2017-11-12: 15 mL via INTRATHECAL

## 2017-11-12 MED ORDER — ONDANSETRON HCL 4 MG/2ML IJ SOLN: 4.0000 mg | Freq: Four times a day (QID) | INTRAMUSCULAR | Status: DC | PRN

## 2017-11-12 MED ORDER — DIAZEPAM 5 MG PO TABS: 5.0000 mg | ORAL_TABLET | Freq: Once | ORAL | Status: DC

## 2017-11-12 NOTE — Discharge Instructions (Signed)
Myelogram Discharge Instructions  1. Go home and rest quietly for the next 24 hours.  It is important to lie flat for the next 24 hours.  Get up only to go to the restroom.  You may lie in the bed or on a couch on your back, your stomach, your left side or your right side.  You may have one pillow under your head.  You may have pillows between your knees while you are on your side or under your knees while you are on your back.  2. DO NOT drive today.  Recline the seat as far back as it will go, while still wearing your seat belt, on the way home.  3. You may get up to go to the bathroom as needed.  You may sit up for 10 minutes to eat.  You may resume your normal diet and medications unless otherwise indicated.  Drink lots of extra fluids today and tomorrow.  4. The incidence of headache, nausea, or vomiting is about 5% (one in 20 patients).  If you develop a headache, lie flat and drink plenty of fluids until the headache goes away.  Caffeinated beverages may be helpful.  If you develop severe nausea and vomiting or a headache that does not go away with flat bed rest, call [phone].  5. You may resume normal activities after your 24 hours of bed rest is over; however, do not exert yourself strongly or do any heavy lifting tomorrow. If when you get up you have a headache when standing, go back to bed and force fluids for another 24 hours.  6. Call your physician for a follow-up appointment.  The results of your myelogram will be sent directly to your physician by the following day.  7. If you have any questions or if complications develop after you arrive home, please call [phone].  Discharge instructions have been explained to the patient.  The patient, or the person responsible for the patient, fully understands these instructions.       May resume Lexapro on Nov. 16, 2018, after 1:00 pm.

## 2017-11-13 ENCOUNTER — Telehealth: Payer: Self-pay | Admitting: Radiology

## 2017-11-13 NOTE — Telephone Encounter (Signed)
Pt had cramps in legs and groin last night. He is also sore in his back. Explained that the cramps and soreness were probably from having the myelo done and should get better now that the bedrest is over. Pt will call if any further problems.

## 2017-11-23 ENCOUNTER — Ambulatory Visit: Payer: Medicare Other | Admitting: Occupational Therapy

## 2017-11-23 ENCOUNTER — Ambulatory Visit: Payer: Medicare Other | Admitting: Physical Therapy

## 2017-11-23 ENCOUNTER — Encounter: Payer: Medicare Other | Admitting: Physical Medicine & Rehabilitation

## 2017-12-02 ENCOUNTER — Ambulatory Visit (INDEPENDENT_AMBULATORY_CARE_PROVIDER_SITE_OTHER): Payer: Medicare Other | Admitting: Neurology

## 2017-12-02 DIAGNOSIS — G8220 Paraplegia, unspecified: Secondary | ICD-10-CM

## 2017-12-02 DIAGNOSIS — G4733 Obstructive sleep apnea (adult) (pediatric): Secondary | ICD-10-CM | POA: Diagnosis not present

## 2017-12-02 DIAGNOSIS — I452 Bifascicular block: Secondary | ICD-10-CM

## 2017-12-02 DIAGNOSIS — M4712 Other spondylosis with myelopathy, cervical region: Secondary | ICD-10-CM

## 2017-12-02 DIAGNOSIS — G4719 Other hypersomnia: Secondary | ICD-10-CM

## 2017-12-02 DIAGNOSIS — Z9989 Dependence on other enabling machines and devices: Principal | ICD-10-CM

## 2017-12-17 NOTE — Therapy (Signed)
[facility] [address] [place], [place], [address] Phone: [phone]   Fax:  [phone]  Patient Details  Name: BOOKERT GUZZI MRN: [id number] Date of Birth: 26-Nov-1946 Referring Provider:  Dr. Donetta Potts  Encounter Date: 12/17/2017  Pt has not returned after 2nd O.T. visit on 11/09/17, therefore will d/c episode of care at this time.  Pt did not meet any goals d/t only being seen for initial evaluation and 1 treatment session, however was issued HEP and given A/E recommendations.   Carey Bullocks, OTR/L 12/17/2017, 8:30 AM  [facility] [address] [place], [place], [address] Phone: [phone]   Fax:  [phone]

## 2017-12-18 ENCOUNTER — Other Ambulatory Visit: Payer: Self-pay | Admitting: Cardiovascular Disease

## 2017-12-18 NOTE — Telephone Encounter (Signed)
Called to [facility] for information on last Torsemide pick up.    I was informed torsemide 20 mg was picked up Dec 14, 60 tablets, take one tablet BID and take additional 10 mg as needed for SOB by B. Bhagat, APP.    Called pt, left message for his wife to call back to verify this information and find out how much extra torsemide pt has been taking.

## 2017-12-18 NOTE — Telephone Encounter (Signed)
Travis [name] is calling because Travis [name] is out of his Torsemide 20 mg because Dr. Acie Fredrickson told him to take more if he needed it .  He is out of the medication and the pharmacy( [facility]  [phone])  will not refill unless they hear from  Dr. Acie Fredrickson Office . Please call if you have any questions . Thanks

## 2017-12-19 ENCOUNTER — Other Ambulatory Visit: Payer: Self-pay | Admitting: Neurology

## 2017-12-19 DIAGNOSIS — Z9989 Dependence on other enabling machines and devices: Principal | ICD-10-CM

## 2017-12-19 DIAGNOSIS — I5032 Chronic diastolic (congestive) heart failure: Secondary | ICD-10-CM

## 2017-12-19 DIAGNOSIS — G4733 Obstructive sleep apnea (adult) (pediatric): Secondary | ICD-10-CM

## 2017-12-19 DIAGNOSIS — J9601 Acute respiratory failure with hypoxia: Secondary | ICD-10-CM

## 2017-12-19 DIAGNOSIS — M4714 Other spondylosis with myelopathy, thoracic region: Secondary | ICD-10-CM

## 2017-12-19 NOTE — Procedures (Signed)
NAME: Travis [name]                                                       DOB: [birth year] MEDICAL RECORD NUMBER  [id number]                                         DOS: 12/02/2017 REFERRING PHYSICIAN: Floyde Parkins, M.D.  STUDY PERFORMED: HST  HISTORY: [name] is a 71 year old married Caucasian right-handed gentleman who presents for evaluation of EDS.  The patient is followed in this office by Dr. Jannifer Franklin for severe gait impairment. Dr. Ronnald Ramp is his neurosurgeon. 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  The patient also has type 2 diabetes mellitus on medication- not insulin.   Chief complaint according to patient: "I will fall asleep any time of day anywhere" while on CPAP. Epworth Sleepiness score endorsed at 20/ 24 points, Fatigue severity score n/a, depression score n/a BMI: 38.66 STUDY RESULTS: Total Recording Time:  9 hours, 21 minutes Total Apnea/Hypopnea Index (AHI):  20.6 /hour Average Oxygen Saturation:  low at 84 %; Lowest Oxygen Desaturation:78 %  Total Time Oxygen Saturation below 89%: (100 %) 334 min.  Average Heart Rate:  72 bpm (54 -93) IMPRESSION: Moderate severe Obstructive sleep apnea with severest hypoxemia.  RECOMMENDATION: CPAP is the only modality of treatment that could address both, apnea and hypoxemia, but may not be enough in this case. I will ask the patient to use an auto-titration CPAP 5-15 cm water and one night use pulse-oximetry with it. It is possible that he needs additional oxygen supplementation.  I certify that I have reviewed the raw data recording prior to the issuance of this report in accordance with the standards of the  American Academy of Sleep Medicine (AASM). Larey Seat, M.D.  12-19-2017     Medical Director of [facility] at [facility],  [place] of the ABPN and ABSM and accredited by AASM

## 2017-12-21 MED ORDER — TORSEMIDE 20 MG PO TABS: ORAL_TABLET | ORAL | 3 refills | Status: DC

## 2017-12-21 NOTE — Telephone Encounter (Signed)
Spoke with pt wife, she reports for the last 2 weekends he has taken extra tablets due to swelling. His weight is about the same and he is a little SOB by her report. Refill sent to the pharmacy electronically.

## 2017-12-23 ENCOUNTER — Telehealth: Payer: Self-pay | Admitting: Neurology

## 2017-12-23 NOTE — Telephone Encounter (Signed)
I called pt. I advised pt that Dr. Brett Fairy reviewed their sleep study results and found that pt has mod-severe sleep apnea. Dr. Brett Fairy recommends that pt starts CPAP. I reviewed PAP compliance expectations with the pt. Pt is agreeable to starting a CPAP. I advised pt that an order will be sent to a DME, [facility], and [facility] will call the pt within about one week after they file with the pt's insurance. [facility] will show the pt how to use the machine, fit for masks, and troubleshoot the CPAP if needed. A follow up appt was made for insurance purposes with Cecille Rubin, NP  on Mar 17, 2018 at 12:45 pm. Pt verbalized understanding to arrive 15 minutes early and bring their CPAP. A letter with all of this information in it will be mailed to the pt as a reminder. I verified with the pt that the address we have on file is correct. Pt verbalized understanding of results. Pt had no questions at this time but was encouraged to call back if questions arise.

## 2017-12-23 NOTE — Telephone Encounter (Signed)
-----   Message from Larey Seat, MD sent at 12/19/2017  1:12 PM EST ----- Severe hypoxemia (if the HST was calibrated correctly) and moderate severe OSA. Treatment has to be PAP, and he has been using CPAP- will start on auto CPAP 5-15, 2 cm EPR and one night with ON Oximetry on PAP therapy. He can chose which interface he likes best.   Plan: Start on auto- titration CPAP 5-15 cm water with 2 cm EPR, and needs ONO within the first month of PAP treatment- if hypoxemia remains on CPAP , he needs to return for attended sleep study, BiPAP and oxygen titration, if needed.

## 2017-12-28 ENCOUNTER — Telehealth: Payer: Self-pay

## 2017-12-28 NOTE — Telephone Encounter (Signed)
**Note De-Identified  Obfuscation** I have done a Torsemide PA through [employer].

## 2018-01-01 ENCOUNTER — Telehealth: Payer: Self-pay | Admitting: Neurology

## 2018-01-01 NOTE — Telephone Encounter (Signed)
Called pt to confirm apt on 1/8, pt states they have an apt on 1/10 with a neurosurgeon that you referred him to. Pt. Opted to keep apt. Unless you want him to cancel apt with you and have him see the neurosurgeon first and f/u with you after?

## 2018-01-02 NOTE — Telephone Encounter (Signed)
I called the patient.  The patient has seen Dr. Ronnald Ramp, he will be set up for an epidural steroid injection next week, I will see him in the office, he believes that his walking is somewhat better than it was when I saw him previously.  I will go ahead and see him in follow-up next week.

## 2018-01-05 ENCOUNTER — Other Ambulatory Visit: Payer: Medicare Other

## 2018-01-05 ENCOUNTER — Ambulatory Visit: Payer: Medicare Other | Admitting: Neurology

## 2018-01-05 ENCOUNTER — Other Ambulatory Visit: Payer: Self-pay | Admitting: Nurse Practitioner

## 2018-01-05 ENCOUNTER — Encounter: Payer: Self-pay | Admitting: Neurology

## 2018-01-05 VITALS — BP 144/73 | HR 57 | Ht 73.0 in

## 2018-01-05 DIAGNOSIS — G63 Polyneuropathy in diseases classified elsewhere: Secondary | ICD-10-CM

## 2018-01-05 DIAGNOSIS — G8220 Paraplegia, unspecified: Secondary | ICD-10-CM

## 2018-01-05 DIAGNOSIS — I5032 Chronic diastolic (congestive) heart failure: Secondary | ICD-10-CM

## 2018-01-05 DIAGNOSIS — I10 Essential (primary) hypertension: Secondary | ICD-10-CM

## 2018-01-05 LAB — BASIC METABOLIC PANEL
BUN/Creatinine Ratio: 27 — ABNORMAL HIGH (ref 10–24)
BUN: 39 mg/dL — ABNORMAL HIGH (ref 8–27)
CALCIUM: 9.3 mg/dL (ref 8.6–10.2)
CHLORIDE: 102 mmol/L (ref 96–106)
CO2: 28 mmol/L (ref 20–29)
Creatinine, Ser: 1.45 mg/dL — ABNORMAL HIGH (ref 0.76–1.27)
GFR calc Af Amer: 55 mL/min/{1.73_m2} — ABNORMAL LOW (ref >59)
GFR calc non Af Amer: 47 mL/min/{1.73_m2} — ABNORMAL LOW (ref >59)
Glucose: 140 mg/dL — ABNORMAL HIGH (ref 65–99)
Potassium: 4.4 mmol/L (ref 3.5–5.2)
Sodium: 145 mmol/L — ABNORMAL HIGH (ref 134–144)

## 2018-01-05 NOTE — Progress Notes (Signed)
Reason for visit: Lumbar spinal stenosis  Travis [name] is an 72 y.o. male  History of present illness:  Travis [name] is a 72 year old right-handed white male with a history of a cervical myelopathy at the C5-6 and C3-4 levels, the patient has had a progressive issue with his ability to ambulate over the last 6-8 months, he has been able to walk only short distances, he cannot walk greater than 50 feet.  The patient indicates that walking significantly worsens his discomfort and worsens his leg weakness.  He has not had any falls.  He uses a walker to get around.  He has undergone a CT scan of the low back that has confirmed severe spinal stenosis at the L4-5 level.  The patient has had problems with urinary urgency and incontinence over the last 6 months associated with this.  He is followed through Dr. Ronnald Ramp from neurosurgery.  He has significant heart issues as well, he has congestive heart failure and has shortness of breath with minimal activity.  The patient will be getting an epidural steroid injection in the cervical area in the near future.  He will follow-up with Dr. Ronnald Ramp.  Past Medical History:  Diagnosis Date  . Cervical myelopathy (HCC)    with myelomalacia at C5-6 and C3-4  . CHF (congestive heart failure) ([place])   . Constipation - functional    controlled with miralax  . DJD (degenerative joint disease)    WHOLE SPINE  . Dyspnea   . Gait disorder   . Hypertension   . Hyperthyroidism    Had Iodine to resolve issue DX 10 YR AGO  . Hypothyroidism    Low d/t treatment of hyperthyroidism  . 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 Peripheral neuropathy   . Pneumonia   . PONV (postoperative nausea and vomiting)   . RBBB   . Renal disorder    acute kidney failure  . Restless legs syndrome 03/14/2015  . RLS (restless legs syndrome)   . Sleep apnea    DX  2009/2010 study done at [facility]  . 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 Type II diabetes mellitus ([place])    diet  controlled    Past Surgical History:  Procedure Laterality Date  . BACK SURGERY     6 surgeries  . CARDIAC CATHETERIZATION [name] 09/07/2015   Procedure: Right/Left Heart Cath and Coronary Angiography;  Surgeon: Peter M Martinique, MD;  Location: [facility];  Service: Cardiovascular;  Laterality: [name];  . CHOLECYSTECTOMY    . LUMBAR LAMINECTOMY/DECOMPRESSION MICRODISCECTOMY  07/14/2012   Procedure: LUMBAR LAMINECTOMY/DECOMPRESSION MICRODISCECTOMY 2 LEVELS;  Surgeon: Eustace Moore, MD;  Location: [facility];  Service: Neurosurgery;  Laterality: Left;  Lumbar two three laminectomy, left thoracic four five transpedicular diskectomy    Family History  Problem Relation Age of Onset  . Polycythemia Mother   . Diabetes Father   . Hypertension Father   . Heart disease Father   . CAD Unknown        1 of his 4 brothers and father has CAD    Social history:  reports that he quit smoking about 32 years ago. His smoking use included cigarettes. He has a 40.00 pack-year smoking history. He quit smokeless tobacco use about 32 years ago. He reports that he does not drink alcohol or use drugs.    Allergies  Allergen Reactions  . Other Other (See Comments)    Anesthesia makes sick, must have  antinausea medication in advance.  . Levofloxacin In D5w Itching    Possible infusion reaction 03/29/17. Tolerating oral Levaquin 03/30/17    Medications:  Prior to Admission medications   Medication Sig Start Date End Date Taking? Authorizing Provider  aspirin EC 81 MG EC tablet Take 1 tablet (81 mg total) by mouth daily. 08/23/15  Yes Thurnell Lose, MD  baclofen (LIORESAL) 10 MG tablet Take 1 tablet (10 mg total) by mouth 3 (three) times daily. For muscle spasms 10/20/17  Yes Angiulli, Lavon Paganini, [name]  escitalopram (LEXAPRO) 10 MG tablet Take 1 tablet (10 mg total) by mouth daily. 10/20/17  Yes Angiulli, Lavon Paganini, [name]  gabapentin (NEURONTIN) 400 MG capsule TAKE 1 CAPSULE (400 MG TOTAL) BY MOUTH EVERY 8  (EIGHT) HOURS. 10/20/17  Yes Angiulli, Lavon Paganini, [name]  glimepiride (AMARYL) 1 MG tablet Take 1 tablet (1 mg total) by mouth daily with breakfast. 10/20/17  Yes Angiulli, Lavon Paganini, [name]  hydrALAZINE (APRESOLINE) 50 MG tablet Take 1 tablet (50 mg total) by mouth every 8 (eight) hours. 10/20/17  Yes Angiulli, Lavon Paganini, [name]  levothyroxine (SYNTHROID, LEVOTHROID) 175 MCG tablet Take 1 tablet (175 mcg total) by mouth daily. 10/20/17  Yes Angiulli, Lavon Paganini, [name]  losartan (COZAAR) 25 MG tablet Take 50 mg by mouth daily.   Yes [provider]  polyethylene glycol (MIRALAX / GLYCOLAX) packet Take 17 g by mouth daily as needed. For constipation 08/09/14  Yes Love, Ivan Anchors, [name]  potassium chloride (KLOR-CON 10) 10 MEQ tablet Take 1 tablet (10 mEq total) by mouth 2 (two) times daily. 10/20/17  Yes Angiulli, Lavon Paganini, [name]  pramipexole (MIRAPEX) 0.5 MG tablet Take 1 tablet (0.5 mg total) by mouth 2 (two) times daily. 10/20/17  Yes Angiulli, Lavon Paganini, [name]  tamsulosin (FLOMAX) 0.4 MG CAPS capsule Take 1 capsule (0.4 mg total) by mouth daily. 10/20/17  Yes Angiulli, Lavon Paganini, [name]  tiotropium (SPIRIVA HANDIHALER) 18 MCG inhalation capsule Place 1 capsule (18 mcg total) into inhaler and inhale daily. 10/20/17  Yes Angiulli, Lavon Paganini, [name]  torsemide (DEMADEX) 20 MG tablet 1 tablet twice daily and 1 extra tablet daily as needed for swelling 12/21/17  Yes Nahser, Wonda Cheng, MD  TOVIAZ 8 MG TB24 tablet Take 1 tablet (8 mg total) by mouth daily. 10/20/17  Yes Angiulli, Lavon Paganini, [name]    ROS:  Out of a complete 14 system review of symptoms, the patient complains only of the following symptoms, and all other reviewed systems are negative.  Wheezing, shortness of breath Leg swelling Eye redness Cold intolerance, excessive thirst Swollen abdomen, constipation Daytime sleepiness Difficulty urinating Incontinence of bladder, frequency of urination Back pain, walking difficulty, neck stiffness Bruising  easily  Blood pressure (!) 144/73, pulse (!) 57, height 6\' 1"  (1.854 m).  Physical Exam  General: The patient is alert and cooperative at the time of the examination.  The patient is moderately to markedly obese.  Skin: 3+ edema below the knees is seen bilaterally.   Neurologic Exam  Mental status: The patient is alert and oriented x 3 at the time of the examination. The patient has apparent normal recent and remote memory, with an apparently normal attention span and concentration ability.   Cranial nerves: Facial symmetry is present. Speech is normal, no aphasia or dysarthria is noted. Extraocular movements are full. Visual fields are full.  Motor: The patient has good strength in all 4 extremities, with exception of bilateral foot drops, left greater than right.  Sensory examination: Soft touch sensation is symmetric on the face, arms, and legs.  Coordination: The patient has good finger-nose-finger and heel-to-shin bilaterally.  Gait and station: The patient requires significant assistance with standing.  Once up, he is not able to walk effectively with assistance, he requires a walker for ambulation.  He comes into the office today in a wheelchair.  Reflexes: Deep tendon reflexes are symmetric, but are depressed.   CT lumbar 11/12/17:  IMPRESSION: 1. Severe multifactorial spinal stenosis at L4-5 just below the prior decompression, greatly progressed from 2013. Moderate to severe bilateral neural foraminal stenosis. 2. Posterior decompression at L2-3 and L3-4 without residual spinal stenosis. Up to moderate neural foraminal stenosis as above.   Assessment/Plan:  1.  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  History of cervical myelopathy  3.  Progressive gait disorder, urinary incontinence  The patient is losing his ability to ambulate and he is having difficulty with controlling the bladder.  This likely is associated with the severe spinal stenosis at  the L4-5 level.  The patient is not a good surgical candidate in part because of his cardiac issues.  The patient will be following up with Dr. Ronnald Ramp.  If surgery is not an option, the patient likely will need a motorized wheelchair.  He will follow-up in 6 months.  Jill Alexanders MD 01/05/2018 9:23 AM  [facility] [address] [place], [name] [address]  Phone [phone] Fax [phone]

## 2018-01-11 LAB — HM DIABETES EYE EXAM

## 2018-01-12 ENCOUNTER — Ambulatory Visit: Payer: Medicare Other | Admitting: Pulmonary Disease

## 2018-01-12 ENCOUNTER — Encounter: Payer: Self-pay | Admitting: Pulmonary Disease

## 2018-01-12 VITALS — BP 134/72 | HR 66 | Ht 73.0 in | Wt 290.0 lb

## 2018-01-12 DIAGNOSIS — J984 Other disorders of lung: Secondary | ICD-10-CM | POA: Diagnosis not present

## 2018-01-12 DIAGNOSIS — R0600 Dyspnea, unspecified: Secondary | ICD-10-CM | POA: Diagnosis not present

## 2018-01-12 NOTE — Progress Notes (Signed)
Travis [name]    [id number]    August 09, 1946  Primary Care Physician:Ehinger, [name], MD  Referring Physician: [name], MD [address] [place], [place] [address]  Chief complaint: Follow up for COPD  HPI: 72 year old with cervical spondylosis, hypertension, diastolic heart failure, hypertension, stage III chronic kidney disease, OSA, peripheral neuropathy. He was admitted and October 2018 for Citrobacter UTI acute on chronic diastolic heart failure and acute respiratory failure with hypoxia secondary to volume overload.  He responded to IV Lasix, oxygen, antibiotics. Given his smoking history is concern for COPD.  He was started on Dulera, Spiriva and albuterol and referred to pulmonary for further evaluation.  He reports that he has tongue swelling and hoarseness after he started on this inhalers.  He does remember to rinse his mouth after each use. He had a previous admission July 2015 for pneumonia, acute respiratory failure.  He was intubated at that time.   Pets: Used to have dogs.  Currently no pets Occupation: semi retired.  Still works as an Research scientist (life sciences) Exposures[place]  But he does not have any direct exposure to these.  No other exposures Smoking history: 40-pack-year smoking.  Quit in 1986 Travel History: No recent travel  Interim History: Started on stiolto at last visit.  He stopped taking it after months since it has not made a change in symptoms.  Being evaluated for L4-L5 spinal surgery  Outpatient Encounter Medications as of 01/12/2018  Medication Sig  . aspirin EC 81 MG EC tablet Take 1 tablet (81 mg total) by mouth daily.  . baclofen (LIORESAL) 10 MG tablet Take 1 tablet (10 mg total) by mouth 3 (three) times daily. For muscle spasms  . escitalopram (LEXAPRO) 10 MG tablet Take 1 tablet (10 mg total) by mouth daily.  [name] gabapentin (NEURONTIN) 400 MG capsule TAKE 1 CAPSULE (400 MG TOTAL) BY MOUTH  EVERY 8 (EIGHT) HOURS.  [name] glimepiride (AMARYL) 1 MG tablet Take 1 tablet (1 mg total) by mouth daily with breakfast.  . hydrALAZINE (APRESOLINE) 50 MG tablet Take 1 tablet (50 mg total) by mouth every 8 (eight) hours.  [name] levothyroxine (SYNTHROID, LEVOTHROID) 175 MCG tablet Take 1 tablet (175 mcg total) by mouth daily.  [name] losartan (COZAAR) 25 MG tablet Take 50 mg by mouth daily.  . polyethylene glycol (MIRALAX / GLYCOLAX) packet Take 17 g by mouth daily as needed. For constipation  . potassium chloride (KLOR-CON 10) 10 MEQ tablet Take 1 tablet (10 mEq total) by mouth 2 (two) times daily.  . pramipexole (MIRAPEX) 0.5 MG tablet Take 1 tablet (0.5 mg total) by mouth 2 (two) times daily.  . tamsulosin (FLOMAX) 0.4 MG CAPS capsule Take 1 capsule (0.4 mg total) by mouth daily.  [name] torsemide (DEMADEX) 20 MG tablet 1 tablet twice daily and 1 extra tablet daily as needed for swelling  . TOVIAZ 8 MG TB24 tablet Take 1 tablet (8 mg total) by mouth daily.  . [DISCONTINUED] tiotropium (SPIRIVA HANDIHALER) 18 MCG inhalation capsule Place 1 capsule (18 mcg total) into inhaler and inhale daily.   No facility-administered encounter medications on file as of 01/12/2018.     Allergies as of 01/12/2018 - Review Complete 01/12/2018  Allergen Reaction Noted  . Other Other (See Comments) 01/28/2012  . Levofloxacin in d5w Itching 03/29/2017    Past Medical History:  Diagnosis Date  . Cervical myelopathy ([place])  with myelomalacia at C5-6 and C3-4  . CHF (congestive heart failure) ([place])   . Constipation - functional    controlled with miralax  . DJD (degenerative joint disease)    WHOLE SPINE  . Dyspnea   . Gait disorder   . Hypertension   . Hyperthyroidism    Had Iodine to resolve issue DX 10 YR AGO  . Hypothyroidism    Low d/t treatment of hyperthyroidism  . 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 Peripheral neuropathy   . Pneumonia   . PONV (postoperative nausea and vomiting)   . RBBB   . Renal  disorder    acute kidney failure  . Restless legs syndrome 03/14/2015  . RLS (restless legs syndrome)   . Sleep apnea    DX  2009/2010 study done at [facility]  . 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 Type II diabetes mellitus ([place])    diet controlled    Past Surgical History:  Procedure Laterality Date  . BACK SURGERY     6 surgeries  . CARDIAC CATHETERIZATION [name] 09/07/2015   Procedure: Right/Left Heart Cath and Coronary Angiography;  Surgeon: Peter M Martinique, MD;  Location: [facility];  Service: Cardiovascular;  Laterality: [name];  . CHOLECYSTECTOMY    . LUMBAR LAMINECTOMY/DECOMPRESSION MICRODISCECTOMY  07/14/2012   Procedure: LUMBAR LAMINECTOMY/DECOMPRESSION MICRODISCECTOMY 2 LEVELS;  Surgeon: Eustace Moore, MD;  Location: [facility];  Service: Neurosurgery;  Laterality: Left;  Lumbar two three laminectomy, left thoracic four five transpedicular diskectomy    Family History  Problem Relation Age of Onset  . Polycythemia Mother   . Diabetes Father   . Hypertension Father   . Heart disease Father   . CAD Unknown        1 of his 4 brothers and father has CAD    Social History   Socioeconomic History  . Marital status: Married    Spouse name: carolyn  . Number of children: 3  . Years of education: 77  . Highest education level: Not on file  Social Needs  . Financial resource strain: Not on file  . Food insecurity - worry: Not on file  . Food insecurity - inability: Not on file  . Transportation needs - medical: Not on file  . Transportation needs - non-medical: Not on file  Occupational History    Employer: [employer]    Comment: Works in [place] Use  . Smoking status: Former Smoker    Packs/day: 2.00    Years: 20.00    Pack years: 40.00    Types: Cigarettes    Last attempt to quit: 06/28/1985    Years since quitting: 32.5  . Smokeless tobacco: Former Systems developer    Quit date: 06/28/1985  Substance and Sexual Activity  . Alcohol use:  No  . Drug use: No  . Sexual activity: Not on file  Other Topics Concern  . Not on file  Social History Narrative   Lives with wife   Patient is married with 3 children.   Patient has 16 yrs of education.   Patient is right handed.   Patient drinks 2 cups of caffeine per day.    Review of systems: Review of Systems  Constitutional: Negative for fever and chills.  HENT: Negative.   Eyes: Negative for blurred vision.  Respiratory: as per HPI  Cardiovascular: Negative for chest pain and palpitations.  Gastrointestinal: Negative for vomiting, diarrhea, blood per rectum. Genitourinary: Negative  for dysuria, urgency, frequency and hematuria.  Musculoskeletal: Negative for myalgias, back pain and joint pain.  Skin: Negative for itching and rash.  Neurological: Negative for dizziness, tremors, focal weakness, seizures and loss of consciousness.  Endo/Heme/Allergies: Negative for environmental allergies.  Psychiatric/Behavioral: Negative for depression, suicidal ideas and hallucinations.  All other systems reviewed and are negative.  Physical Exam: Blood pressure 134/72, pulse 66, height 6\' 1"  (1.854 m), weight 290 lb (131.5 kg), SpO2 91 %. Gen:      No acute distress HEENT:  EOMI, sclera anicteric Neck:     No masses; no thyromegaly Lungs:    Clear to auscultation bilaterally; normal respiratory effort CV:         Regular rate and rhythm; no murmurs Abd:      + bowel sounds; soft, non-tender; no palpable masses, no distension Ext:    No edema; adequate peripheral perfusion Skin:      Warm and dry; no rash Neuro: alert and oriented x 3 Psych: normal mood and affect   Data Reviewed: Chest x-ray 10/06/06-right hemidiaphragm elevation with bilateral basal atelectasis CT angiogram 10/02/17- enlarged pulmonary artery suggestive of pulmonary hypertension, lower lobe atelectasis, right hemidiaphragm elevation.  No pulmonary embolism I have reviewed all images personally  PFTs  10/07/17 FVC 2.19 [45%], FEV1 1.69 (47%], F/F 77, TLC 55%, DLCO 35% Obstruction with severe restriction and diffusion impairment.  CBC 10/02/17-absolute eosinophil count 0 CBC 16/07/37-TGGYIRSW eosinophilic count 546  Assessment:  Consult for evaluation of dyspnea He has heavy smoking history, quit in 86.  His PFTs show no overt obstruction by F/F criteria however there is concavity to his flow loop and reduction in mid flow suggestive of small airway disease. He has very minimal COPD if at all.  He has not responded to inhalers and I do not believe he needs to be on controller medication and can use albuterol as needed.  Can follow-up in the pulmonary clinic as needed.  Restrictive lung disease PFTs also noted for restriction with diffusion impairment that corrects for alveolar volume.  There is no evidence of interstitial lung disease on CT scan He does have right hemidiaphragm elevation dating back to 2007 which may be from his extensive cervical spine surgery. His restriction is likely result of this and also body habitus with obesity.  I have encouraged him to continue to work on weight loss however he is restricted in his mobility since he has lower extremity weakness from severe spinal stenosis.  Plan/Recommendations: - Use albuterol as needed - Continue to work on weight loss.  Follow up as needed.  Marshell Garfinkel MD [facility] Pager [phone] 01/12/2018, 9:34 AM  CC: [name], MD

## 2018-01-12 NOTE — Patient Instructions (Signed)
He can stop the stiolto since it is not helping Just use albuterol as needed Your pulmonary function test show very minimal changes of COPD.  I do not believe need to be on regular inhaler therapy Follow-up with the pulmonary clinic as needed.

## 2018-01-25 ENCOUNTER — Telehealth: Payer: Self-pay | Admitting: Neurology

## 2018-01-25 NOTE — Telephone Encounter (Signed)
I called the patient.  The patient is seen Dr. Ronnald Ramp, he is under the impression that Dr. Ronnald Ramp indicated that he needed surgery at the T1 level, prior myelogram did not show significant spinal cord compression.  He has severe spinal stenosis at the L4-5 level.  I will call Dr. Ronnald Ramp and see if I can discuss this issue with him to clarify what he believes needs to be done in regards to surgery.

## 2018-01-25 NOTE — Telephone Encounter (Signed)
Pt is requesting a call from RN surgeon is wanting to do surgery on T1 while Dr. Jannifer Franklin thought Lumbar 4 &5 had a pinched nerve

## 2018-01-26 ENCOUNTER — Telehealth: Payer: Self-pay | Admitting: *Deleted

## 2018-01-26 NOTE — Telephone Encounter (Addendum)
   Primary Cardiologist:No primary care provider on file.  Chart reviewed as part of pre-operative protocol coverage. The patient has appointment with Dr. Acie Fredrickson 02/05/18   Pre-op covering staff: - Please contact requesting surgeon's office via preferred method (i.e, phone, fax) to inform them that will address surgical clearance during route office visit in few days.   [place], [place]  01/26/2018, 3:06 PM

## 2018-01-26 NOTE — Telephone Encounter (Signed)
Called Dr. Ronnald Ramp office to inform them that patient has an office visit with Dr. Acie Fredrickson on 02/05/18 for clearance.

## 2018-01-26 NOTE — Telephone Encounter (Addendum)
   [facility] Pre-operative Risk Assessment    Request for surgical clearance:  1. What type of surgery is being performed? T1-T2 Laminectomy and Foraminotomy   2. When is this surgery scheduled? Pending clearance   3. What type of clearance is required (medical clearance vs. Pharmacy clearance to hold med vs. Both)? BOTH (Cardiac clearance/risk assessment)  4. Are there any medications that need to be held prior to surgery and how long? Advise on any meds that need to be held prior to surgery per clearance note.   5. Practice name and name of physician performing surgery? Dr. Ronnald Ramp at [facility]   6. What is your office phone and fax number? [phone] and fax--[phone]   7. Anesthesia type (None, local, MAC, general) ? General anesthesia    Nuala Alpha 01/26/2018, 2:53 PM  _________________________________________________________________   (provider comments below)

## 2018-01-26 NOTE — Telephone Encounter (Signed)
I called and talked with Dr. Ronnald Ramp and I talked with Travis [name] today.  Dr. Ronnald Ramp does indicate that there is some spinal stenosis at Travis T1 level, he is concerned from examination there is more spasticity, Travis lack of back pain and leg pain he believes goes against Travis low back is being Travis source of Travis walking problem.  Dr. Ronnald Ramp indicates that Travis spinal surgery at Travis T1 level is less involved, likely to be better tolerated by Travis [name].  It is possible that both levels may need surgery.  Travis [name] will be seen his cardiology Dr., if surgery is not recommended, Travis above recommendations are a moot point.  I discussed this issue with Travis [name].

## 2018-01-27 NOTE — Telephone Encounter (Signed)
I reached out to [facility] and they state they have made multiple calls to try and set up a time to meet with the patient. They stated they would try him again today. I have also gave them all the numbers we have on file in case they needed another number

## 2018-01-27 NOTE — Telephone Encounter (Signed)
Pt called [facility] has touched base with one time. He said they were to come to his home and bring the machine 1 mth ago but has not heard back. I gave him [facility] phone number, he will call them also.  FYI

## 2018-02-05 ENCOUNTER — Encounter: Payer: Self-pay | Admitting: Cardiovascular Disease

## 2018-02-05 ENCOUNTER — Ambulatory Visit: Payer: Medicare Other | Admitting: Cardiovascular Disease

## 2018-02-05 VITALS — BP 118/48 | HR 65 | Ht 73.0 in | Wt 288.0 lb

## 2018-02-05 DIAGNOSIS — I10 Essential (primary) hypertension: Secondary | ICD-10-CM | POA: Diagnosis not present

## 2018-02-05 DIAGNOSIS — I5032 Chronic diastolic (congestive) heart failure: Secondary | ICD-10-CM

## 2018-02-05 NOTE — Patient Instructions (Addendum)
Medication Instructions:  Your physician recommends that you continue on your current medications as directed. Please refer to the Current Medication list given to you today.   Labwork: None Ordered   Testing/Procedures: None Ordered   Follow-Up: Your physician recommends that you schedule a follow-up appointment in: 3 months with Dr. Acie Fredrickson   If you need a refill on your cardiac medications before your next appointment, please call your pharmacy.   Thank you for choosing [facility]! Christen Bame, RN [phone]     For your  leg edema you  should do  the following 1. Leg elevation - I recommend the Lounge Dr. Leg rest.  See below for details  2. Salt restriction  -  Use potassium chloride instead of regular salt as a salt substitute. 3. Walk regularly 4. Compression hose - [facility] 5. Weight loss    Available on [email or web address] Or  Go to [email or web address]   ( coupon code [email or web address] will get you 15% off)

## 2018-02-05 NOTE — Progress Notes (Addendum)
Cardiology Office Note   Date:  02/05/2018   ID:  Travis [name] 15-Sep-1946, MRN [id number]  PCP:  [name], MD  Cardiologist:   Mertie Moores, MD   Chief Complaint  Patient presents with  . Congestive Heart Failure   1. Hypertension 2. Hypothyroidism 3. DM 4. OSA 5. Mild pulmonary hypertension.  6. Chronic diastolic dysfunction   Notes prior to Sept. 2015  [name] is seen back today for a post hospital visit. 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   Travis [name] He is a 72 year old male with HTN, hypothyroidism, OSA, poorly controlled type 2 DM, former smoker and DJD.  Admitted back in July with CAP/UTI with associated sepsis. Was intubated due to progressive respiratory failure. Had AKI/ATN in the setting of PNA, hypoxic RF and septic shock. Felt to have some degree of diastolic HF - echo was not of good quality. Apparently has had past high sodium diet and uncontrolled HTN. Looks like he was on Lisinopril HCT, cardura and a lower dose of beta blocker. Mildly elevated troponin but no anginal symptoms and this was suspected to be from respiratory failure/CAP with septic shock.   Was seen by Cecille Rubin on August 28, Reported getting stronger, Breathing was ok. Leg edema has improved.   Sept. 9, 2015:  Travis [name] is seen today with his wife. Walking with a walker. Getting stronger every day.  Has cut out his high salt foods.  He i still working Administrator, [employer])  Has been seen by home health nurses - HR has been slow. No episodes of syncope.   Sept. 24, 2015:  Travis [name] is doing OK but continues to gradually gained strength. His home health nurse recently noted that his heart rate was  very slow. Occasionally have some episodes of very mild lightheadedness when he sat on the toilet type of bowel movement. He has not had episodes of syncope and seems to be doing pretty well at other times. He Has not had any syncope.   Dec. 11, 2015:  Travis [name] is here today for his HTN,  Has OSA - has been wearing his CPAP. , no CP and no dyspnea. Occasional eipisodes of orthostatic hypotension. Making progress with his rehab but he had to suspend it because of his elevated BP Does his best to avoid salt.  We reviewed his diet - no high salt items typically .  BP at the rehab is typically elevated but    March 09, 2015:   Travis [name] is a 71 y.o. male who presents for follow-up of his high blood pressure. He slowly getting stronger but still walks with the assistance of a walker. He has some left leg pain and he has had to re-start using his walker again - was using a cane.  Wears his CPAP at night.  BP yesterday was 416 -606 systolic .   He had some lightheadedness yesterday - glucose was ok.  BP was ok  August 17, 2015:  Doing well. Going to rehab -  BP looks great  No CP or dyspnea   Sept. 27, 2016: Doing well. Still has lots of issues with leg swelling .  Elevates his legs at night - legs are much better in the am.  Cath revealed mildly elevated right sided pressures. Has mild - moderate CAD  LV EF is 55-60%  We doubled his lasix to 40 BID for several days and felt much better .    Dec. 5, 2016: Travis [name] is seen today for follow up of his CHF. Seems to be getting stronger. Able to get around with a cane - which is an improvement  Doing some calf exercises which has helped the leg swelling .   July 17, 2016;  Travis [name] is seen today  Runs out of breath before he should  O2 sats here are 88% on room air.   Travis [name] has a pulse-ox - his typical O2 sate is between 92%-94%.  Cath last year showed mild - mod irreg. Echo shows normal LV function with grade 2 diastolic  dysfunction . Not eating any extra salt   Jan. 22, 2018:  Travis [name] is seen today wife his wife.  Walking with a walker  Has had leg swelling which has now resolved.  Trying to avoid salt.     April 14, 2017:  Travis [name] was in the hospital in March for pneumonia  And CHF exacerbation .   He was also found to have a Pseudomonas urinary tract infection He was treated with IV antibiotics and diuretics and made a steady improvement after 3 days.  O2 saturations are 93% today on room air. His weight is 289 pounds.  Working on his diet.   Is eating out less.   No CP  Uses his CPAP,   Has restless legs.   July 14, 2017:  Travis [name] is seen today for work in visit for worsening CHF Travis [name] had increase in weight gain and leg swelling. Oxygen level was 83%. Normally takes Demadex 20 mg BID  He doubled his Demadex to 40 BID for 3 days and legs have returned to normal .    Breathing is back to baseline Legs are still weak  Feb. 8, 2019: Travis [name] is seen for office visit .  Seen with wife Travis [name])  and daughter Travis [name]) .  Just saw pulmonary -  O2 sats are low 90s  No recent hospitalizations   Is not able to stand, spending lots of time in the wheelchair .  Unable to walk.    Past Medical History:  Diagnosis Date  . Cervical myelopathy (HCC)    with myelomalacia at C5-6 and C3-4  . CHF (congestive heart failure) ([place])   . Constipation - functional    controlled with miralax  . DJD (degenerative joint disease)    WHOLE SPINE  . Dyspnea   . Gait disorder   . Hypertension   . Hyperthyroidism    Had Iodine to resolve issue DX 10 YR AGO  . Hypothyroidism    Low d/t treatment of hyperthyroidism  . 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 Peripheral neuropathy   . Pneumonia   . PONV (postoperative nausea and vomiting)   . RBBB   . Renal disorder    acute kidney failure  . Restless legs syndrome 03/14/2015  . RLS (restless legs syndrome)   . Sleep apnea    DX  2009/2010 [facility] done at [facility]  .  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 Type II diabetes mellitus ([place])    diet controlled    Past Surgical History:  Procedure Laterality Date  . BACK SURGERY  6 surgeries  . CARDIAC CATHETERIZATION [name] 09/07/2015   Procedure: Right/Left Heart Cath and Coronary Angiography;  Surgeon: Peter M Martinique, MD;  Location: [facility];  Service: Cardiovascular;  Laterality: [name];  . CHOLECYSTECTOMY    . LUMBAR LAMINECTOMY/DECOMPRESSION MICRODISCECTOMY  07/14/2012   Procedure: LUMBAR LAMINECTOMY/DECOMPRESSION MICRODISCECTOMY 2 LEVELS;  Surgeon: Eustace Moore, MD;  Location: [facility];  Service: Neurosurgery;  Laterality: Left;  Lumbar two three laminectomy, left thoracic four five transpedicular diskectomy     Current Outpatient Medications  Medication Sig Dispense Refill  . aspirin EC 81 MG EC tablet Take 1 tablet (81 mg total) by mouth daily. 30 tablet 0  . baclofen (LIORESAL) 10 MG tablet Take 1 tablet (10 mg total) by mouth 3 (three) times daily. For muscle spasms 90 each 1  . escitalopram (LEXAPRO) 10 MG tablet Take 1 tablet (10 mg total) by mouth daily. 30 tablet 2  . gabapentin (NEURONTIN) 400 MG capsule TAKE 1 CAPSULE (400 MG TOTAL) BY MOUTH EVERY 8 (EIGHT) HOURS. 90 capsule 0  . glimepiride (AMARYL) 1 MG tablet Take 1 tablet (1 mg total) by mouth daily with breakfast. 30 tablet 0  . hydrALAZINE (APRESOLINE) 50 MG tablet Take 1 tablet (50 mg total) by mouth every 8 (eight) hours. 90 tablet 0  . levothyroxine (SYNTHROID, LEVOTHROID) 175 MCG tablet Take 1 tablet (175 mcg total) by mouth daily. 30 tablet 2  . losartan (COZAAR) 25 MG tablet Take 50 mg by mouth daily.    . polyethylene glycol (MIRALAX / GLYCOLAX) packet Take 17 g by mouth daily as needed. For constipation 14 each 0  . potassium chloride (KLOR-CON 10) 10 MEQ tablet Take 1 tablet (10 mEq total) by mouth 2 (two) times daily. 180 tablet 3  . pramipexole (MIRAPEX) 0.5 MG tablet Take 1 tablet (0.5 mg total) by mouth 2  (two) times daily. 60 tablet 0  . tamsulosin (FLOMAX) 0.4 MG CAPS capsule Take 1 capsule (0.4 mg total) by mouth daily. 30 capsule 0  . torsemide (DEMADEX) 20 MG tablet 1 tablet twice daily and 1 extra tablet daily as needed for swelling 270 tablet 3  . TOVIAZ 8 MG TB24 tablet Take 1 tablet (8 mg total) by mouth daily. 30 tablet 11   No current facility-administered medications for this visit.     Allergies:   Other and Levofloxacin in d5w    Social History:  The patient  reports that he quit smoking about 32 years ago. His smoking use included cigarettes. He has a 40.00 pack-year smoking history. He quit smokeless tobacco use about 32 years ago. He reports that he does not drink alcohol or use drugs.   Family History:  The patient's family history includes CAD in his unknown relative; Diabetes in his father; Heart disease in his father; Hypertension in his father; Polycythemia in his mother.    ROS:    Noted in current hx. Otherwise negative   Physical Exam: Blood pressure (!) 118/48, pulse 65, height 6\' 1"  (1.854 m), weight 288 lb (130.6 kg), SpO2 92 %.  GEN:  Well nourished, well developed in no acute distress HEENT: Normal NECK: No JVD; No carotid bruits LYMPHATICS: No lymphadenopathy CARDIAC: RR, normal S1S2  RESPIRATORY:  Clear to auscultation without rales, wheezing or rhonchi  ABDOMEN: Soft, non-tender, non-distended MUSCULOSKELETAL:  3+ pitting edema up to thighs SKIN: Warm and dry NEUROLOGIC:  Alert and oriented x 3   EKG:     Recent Labs: 02/12/2017: NT-Pro  BNP 109 10/02/2017: B Natriuretic Peptide 169.8; TSH 0.446 10/08/2017: ALT 58; Magnesium 2.4 10/15/2017: Hemoglobin 11.2; Platelets 216 01/05/2018: BUN 39; Creatinine, Ser 1.45; Potassium 4.4; Sodium 145    Lipid Panel    Component Value Date/Time   CHOL 124 08/20/2015 1458   TRIG 82 08/20/2015 1458   HDL 29 (L) 08/20/2015 1458   CHOLHDL 4.3 08/20/2015 1458   VLDL 16 08/20/2015 1458   LDLCALC 79  08/20/2015 1458      Wt Readings from Last 3 Encounters:  02/05/18 288 lb (130.6 kg)  01/12/18 290 lb (131.5 kg)  11/11/17 293 lb (132.9 kg)      Other studies Reviewed: Additional studies/ records that were reviewed today include: . Review of the above records demonstrates:    ASSESSMENT AND PLAN:  1.  Chronic diastolic congestive heart failure:    Travis [name] has been trying to avoid eating fast food and restaurant food.  Seems to be breathing fairly well.  He does have significant leg edema but this is because he is been in the wheelchair for the past month or so.  He needs to have back surgery.  He seems to be fairly well compensated at this point.  I do think that he would be at moderate to high risk for cardia vascular complications with his back surgery but I do think that he should have the surgery.  He seems to be well compensated at present .     Would recommend that that he have a surgery in the hospital and he will likely need several days of observation and medication adjustment.  We will be happy to follow him around the time of his surgery.  2.  Hypertension:   BP is stable   3. OSA    Current medicines are reviewed at length with the patient today.  The patient does not have concerns regarding medicines.    Labs/ tests ordered today include:      Signed, Mertie Moores, MD  02/05/2018 8:33 AM    [facility] [address], [place], [place]  [address] Phone: [phone]; Fax: [phone]

## 2018-02-10 ENCOUNTER — Telehealth: Payer: Self-pay | Admitting: Cardiovascular Disease

## 2018-02-10 NOTE — Telephone Encounter (Signed)
Notified patient that I have routed the surgical clearance to Dr. Ronnald Ramp through the computer and fax machine today. He thanked me for the call.

## 2018-02-10 NOTE — Telephone Encounter (Signed)
Travis [name] is calling because the surgeon states that they have not received clearance from his visit with Dr. Acie Fredrickson on 02/05/18. Please call

## 2018-02-15 ENCOUNTER — Other Ambulatory Visit: Payer: Self-pay | Admitting: Neurological Surgery

## 2018-02-17 ENCOUNTER — Telehealth: Payer: Self-pay | Admitting: Cardiovascular Disease

## 2018-02-17 NOTE — Telephone Encounter (Signed)
Our rounding teams are available to follow him after surgery .  Please have their APP contact us at that time

## 2018-02-17 NOTE — Telephone Encounter (Signed)
Spoke with Lorriane Shire at [facility], Dr. Ronnald Ramp' office who called to ask that Dr. Acie Fredrickson follow the patient after spine surgery due to his intermediate to high risk status. Patient's surgery is scheduled for March 13 and he is expected to stay in the hospital for 2-3 days per Lorriane Shire. I advised that I will forward message to Dr. Acie Fredrickson for awareness and that we will try to stay closely involved. Lorriane Shire thanked me for the call.

## 2018-02-17 NOTE — Telephone Encounter (Signed)
New Message     They needs Dr Acie Fredrickson to be involved in perioperative care , patient is having surgery 03/10/18 at [facility]   Patient is having spine surgery.  Has already been cleared for surgery, they just want Dr Acie Fredrickson to follow patient after surgery

## 2018-03-01 NOTE — Progress Notes (Addendum)
Anesthesia Chart Review:  Pt is a 72 year old male scheduled for L4-5, L5-S1 laminectomy and foraminotomy, L4-5 instrumented posterolateral fusion, T1-2 laminectomy and foraminotomy on 03/10/2018 with Sherley Bounds, MD  Providers:  - PCP is [name], MD  - Cardiologist is Mertie Moores, MD who cleared pt for surgery at moderate to high risk at last office visit 02/05/18, noting "Would recommend that that he have a surgery in the hospital and he will likely need several days of observation and medication adjustment.  We will be happy to follow him around the time of his surgery."  - Pulmonologist is Marshell Garfinkel, MD. 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 Former smoker. BMI 38. S/p lumbar laminectomy/decompression 07/14/12. S/p lumbar fusion 02/11/12  - Hospitalized 10/5-10/11/18 for UTI, acute on chronic CHF, acute respiratory failure with hypoxia (treated with lasix and O2), acute on chronic CKD  - Hospitalized 3/29-04/01/17 for acute on chronic CHF, possible CAP, acute respiratory failure with hypoxia, UTI   Medications include: ASA 81mg , glimepiride, hydralazine, levothyroxine, losartan, potassium, torsemide  BP (!) 140/58   Pulse 72   Temp 36.5 C   Resp 20   Ht 6\' 1"  (1.854 m)   Wt 293 lb 1.6 oz (132.9 kg)   SpO2 95%   BMI 38.67 kg/m     Preoperative labs reviewed.   - HbA1c 7.7, glucose 178  1 view CXR 10/06/17:  - Minimal bibasilar atelectasis, most likely secondary to a shallow inspiration. - Aortic atherosclerosis.  EKG 11/03/17: NSR. RBBB.   Renal US 10/02/17:  1. No acute findings.  Specifically, no hydroureteronephrosis. 2. Indeterminate lesion in the lower pole of the left kidney measuring 2.2 x 2.0 x 2.0 cm. This is new compared to the prior abdominal ultrasound 07/14/2014. Further  characterization with nonemergent MRI of the abdomen with and without IV gadolinium should be considered in the near future to exclude underlying neoplasm.  CT angio chest 10/02/17:  1. Exam detail diminished secondary to suboptimal pulmonary arterial opacification and respiratory motion artifact. No saddle embolus or proximal lobar pulmonary artery filling defects identified. 2. Increase caliber of the main pulmonary artery suggestive of PA hypertension. 3. Bilateral lower lobe subsegmental atelectasis and asymmetric elevation of the right hemidiaphragm. 4. Aortic Atherosclerosis (ICD10-I70.0). Coronary artery calcifications noted involving the LAD, left circumflex and RCA.  Echo 03/06/17:  - Left ventricle: The cavity size was normal. Wall thickness was increased in a pattern of mild LVH. Systolic function was vigorous. The estimated ejection fraction was in the range of 65% to 70%. Wall motion was normal; there were no regional wall motion abnormalities. Doppler parameters are consistent with pseudonormal left ventricular relaxation (grade 2 diastolic dysfunction). The E/A ratio is >1.5. The E/e&' ratio is >15, suggesting elevated LV filling pressure. - Left atrium: The atrium was mildly dilated. - Inferior vena cava: The vessel was normal in size. The respirophasic diameter changes were in the normal range (>= 50%), consistent with normal central venous pressure. - Impressions: Compared to a prior study in 2016, there is no significant change. LV filling pressure is elevated.  Cardiac cath 09/07/15:   Prox RCA to Mid RCA lesion, 40% stenosed.  Ost Cx to Prox Cx lesion, 50% stenosed.  LM lesion, 40% stenosed.  The left ventricular systolic function is normal.  Dist LAD lesion, 40% stenosed. 1. Nonobstructive CAD  2. Normal LV function. 3. Mild pulmonary HTN with normal LV filling pressures.   Saw pt at pre-admission testing appointment. Pt with significant 3+ pitting edema up to his thighs.   No c/o SOB, CP.  Lungs CTA B.  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  Notes by Dr. Acie Fredrickson indicate he expects pt will need adjustment of CHF meds post-op.   If no changes, I anticipate pt can proceed with surgery as scheduled.   Willeen Cass, FNP-BC [facility]esthesiology Phone: [phone] 03/02/2018 3:00 PM

## 2018-03-01 NOTE — Pre-Procedure Instructions (Signed)
Travis [name]  03/01/2018      [place], [place] [address] Phone: [phone] Fax: [phone]    Your procedure is scheduled on March 13  Report to [facility] at [place].M.  Call this number if you have problems the morning of surgery:  [phone]   Remember:  Do not eat food or drink liquids after midnight.  Continue all medications as directed by your physician except follow these medication instructions before surgery below   Take these medicines the morning of surgery with A SIP OF WATER  baclofen (LIORESAL) gabapentin (NEURONTIN) escitalopram (LEXAPRO) hydrALAZINE (APRESOLINE) levothyroxine (SYNTHROID, LEVOTHROID)  pramipexole (MIRAPEX) tamsulosin (FLOMAX) TOVIAZ  7 days prior to surgery STOP taking any Aspirin(unless otherwise instructed by your surgeon), Aleve, Naproxen, Ibuprofen, Motrin, Advil, Goody's, BC's, all herbal medications, fish oil, and all vitamins  Follow your doctors instructions regarding your Aspirin.  If no instructions were given by your doctor, then you will need to call the prescribing office office to get instructions.     WHAT DO I DO ABOUT MY DIABETES MEDICATION?   [name] Do not take oral diabetes medicines (pills) the morning of surgery. glimepiride (AMARYL)    How to Manage Your Diabetes Before and After Surgery  Why is it important to control my blood sugar before and after surgery? . Improving blood sugar levels before and after surgery helps healing and can limit problems. . A way of improving blood sugar control is eating a healthy diet by: o  Eating less sugar and carbohydrates o  Increasing activity/exercise o  Talking with your doctor about reaching your blood sugar goals . High blood sugars (greater than 180 mg/dL) can raise your risk of infections and slow your recovery, so you will need to focus on controlling your diabetes during the  weeks before surgery. . Make sure that the doctor who takes care of your diabetes knows about your planned surgery including the date and location.  How do I manage my blood sugar before surgery? . Check your blood sugar at least 4 times a day, starting 2 days before surgery, to make sure that the level is not too high or low. o Check your blood sugar the morning of your surgery when you wake up and every 2 hours until you get to the Short Stay unit. . If your blood sugar is less than 70 mg/dL, you will need to treat for low blood sugar: o Do not take insulin. o Treat a low blood sugar (less than 70 mg/dL) with  cup of clear juice (cranberry or apple), 4 glucose tablets, OR glucose gel. o Recheck blood sugar in 15 minutes after treatment (to make sure it is greater than 70 mg/dL). If your blood sugar is not greater than 70 mg/dL on recheck, call [phone] for further instructions. . Report your blood sugar to the short stay nurse when you get to Short Stay.  . If you are admitted to the hospital after surgery: o Your blood sugar will be checked by the staff and you will probably be given insulin after surgery (instead of oral diabetes medicines) to make sure you have good blood sugar levels. o The goal for blood sugar control after surgery is 80-180 mg/dL.    Do not wear jewelry  Do not wear lotions, powders, or cologne, or deodorant.  Men may shave face and neck.  Do not bring  valuables to the hospital.  [facility] is not responsible for any belongings or valuables.  Contacts, dentures or bridgework may not be worn into surgery.  Leave your suitcase in the car.  After surgery it may be brought to your room.  For patients admitted to the hospital, discharge time will be determined by your treatment team.  Patients discharged the day of surgery will not be allowed to drive home.    Special instructions:   [facility]- Preparing For Surgery  Before surgery, you can play an  important role. Because skin is not sterile, your skin needs to be as free of germs as possible. You can reduce the number of germs on your skin by washing with CHG (chlorahexidine gluconate) Soap before surgery.  CHG is an antiseptic cleaner which kills germs and bonds with the skin to continue killing germs even after washing.  Please do not use if you have an allergy to CHG or antibacterial soaps. If your skin becomes reddened/irritated stop using the CHG.  Do not shave (including legs and underarms) for at least 48 hours prior to first CHG shower. It is OK to shave your face.  Please follow these instructions carefully.   1. Shower the NIGHT BEFORE SURGERY and the MORNING OF SURGERY with CHG.   2. If you chose to wash your hair, wash your hair first as usual with your normal shampoo.  3. After you shampoo, rinse your hair and body thoroughly to remove the shampoo.  4. Use CHG as you would any other liquid soap. You can apply CHG directly to the skin and wash gently with a scrungie or a clean washcloth.   5. Apply the CHG Soap to your body ONLY FROM THE NECK DOWN.  Do not use on open wounds or open sores. Avoid contact with your eyes, ears, mouth and genitals (private parts). Wash Face and genitals (private parts)  with your normal soap.  6. Wash thoroughly, paying special attention to the area where your surgery will be performed.  7. Thoroughly rinse your body with warm water from the neck down.  8. DO NOT shower/wash with your normal soap after using and rinsing off the CHG Soap.  9. Pat yourself dry with a CLEAN TOWEL.  10. Wear CLEAN PAJAMAS to bed the night before surgery, wear comfortable clothes the morning of surgery  11. Place CLEAN SHEETS on your bed the night of your first shower and DO NOT SLEEP WITH PETS.    Day of Surgery: Do not apply any deodorants/lotions. Please wear clean clothes to the hospital/surgery center.      Please read over the following fact sheets  that you were given.

## 2018-03-02 ENCOUNTER — Encounter (HOSPITAL_COMMUNITY): Payer: Self-pay

## 2018-03-02 ENCOUNTER — Ambulatory Visit (HOSPITAL_COMMUNITY)
Admission: RE | Admit: 2018-03-02 | Discharge: 2018-03-02 | Disposition: A | Discharge status: Home / Self Care | Payer: Medicare Other | Source: Ambulatory Visit | Attending: Neurological Surgery | Admitting: Neurological Surgery

## 2018-03-02 ENCOUNTER — Encounter (HOSPITAL_COMMUNITY)
Admission: RE | Admit: 2018-03-02 | Discharge: 2018-03-02 | Disposition: A | Discharge status: Home / Self Care | Payer: Medicare Other | Source: Ambulatory Visit | Attending: Neurological Surgery | Admitting: Neurological Surgery

## 2018-03-02 ENCOUNTER — Other Ambulatory Visit: Payer: Self-pay

## 2018-03-02 DIAGNOSIS — Z01812 Encounter for preprocedural laboratory examination: Secondary | ICD-10-CM | POA: Insufficient documentation

## 2018-03-02 DIAGNOSIS — I2510 Atherosclerotic heart disease of native coronary artery without angina pectoris: Secondary | ICD-10-CM | POA: Insufficient documentation

## 2018-03-02 DIAGNOSIS — I509 Heart failure, unspecified: Secondary | ICD-10-CM | POA: Diagnosis not present

## 2018-03-02 DIAGNOSIS — M4804 Spinal stenosis, thoracic region: Secondary | ICD-10-CM

## 2018-03-02 DIAGNOSIS — E1122 Type 2 diabetes mellitus with diabetic chronic kidney disease: Secondary | ICD-10-CM | POA: Diagnosis not present

## 2018-03-02 DIAGNOSIS — I130 Hypertensive heart and chronic kidney disease with heart failure and stage 1 through stage 4 chronic kidney disease, or unspecified chronic kidney disease: Secondary | ICD-10-CM | POA: Insufficient documentation

## 2018-03-02 DIAGNOSIS — N183 Chronic kidney disease, stage 3 (moderate): Secondary | ICD-10-CM | POA: Diagnosis not present

## 2018-03-02 DIAGNOSIS — Z79899 Other long term (current) drug therapy: Secondary | ICD-10-CM | POA: Insufficient documentation

## 2018-03-02 DIAGNOSIS — Z7982 Long term (current) use of aspirin: Secondary | ICD-10-CM | POA: Diagnosis not present

## 2018-03-02 DIAGNOSIS — Z87891 Personal history of nicotine dependence: Secondary | ICD-10-CM | POA: Insufficient documentation

## 2018-03-02 DIAGNOSIS — G4733 Obstructive sleep apnea (adult) (pediatric): Secondary | ICD-10-CM | POA: Diagnosis not present

## 2018-03-02 DIAGNOSIS — M48061 Spinal stenosis, lumbar region without neurogenic claudication: Secondary | ICD-10-CM

## 2018-03-02 DIAGNOSIS — Z7984 Long term (current) use of oral hypoglycemic drugs: Secondary | ICD-10-CM | POA: Insufficient documentation

## 2018-03-02 DIAGNOSIS — E039 Hypothyroidism, unspecified: Secondary | ICD-10-CM | POA: Diagnosis not present

## 2018-03-02 DIAGNOSIS — Z01818 Encounter for other preprocedural examination: Secondary | ICD-10-CM | POA: Insufficient documentation

## 2018-03-02 LAB — CBC WITH DIFFERENTIAL/PLATELET
BASOS PCT: 1 %
Basophils Absolute: 0 10*3/uL (ref 0.0–0.1)
EOS ABS: 0.1 10*3/uL (ref 0.0–0.7)
Eosinophils Relative: 2 %
HCT: 40.7 % (ref 39.0–52.0)
Hemoglobin: 12.7 g/dL — ABNORMAL LOW (ref 13.0–17.0)
Lymphocytes Relative: 16 %
Lymphs Abs: 1 10*3/uL (ref 0.7–4.0)
MCH: 29 pg (ref 26.0–34.0)
MCHC: 31.2 g/dL (ref 30.0–36.0)
MCV: 92.9 fL (ref 78.0–100.0)
MONO ABS: 0.3 10*3/uL (ref 0.1–1.0)
MONOS PCT: 6 %
Neutro Abs: 4.6 10*3/uL (ref 1.7–7.7)
Neutrophils Relative %: 75 %
PLATELETS: 192 10*3/uL (ref 150–400)
RBC: 4.38 MIL/uL (ref 4.22–5.81)
RDW: 16.1 % — AB (ref 11.5–15.5)
WBC: 6 10*3/uL (ref 4.0–10.5)

## 2018-03-02 LAB — GLUCOSE, CAPILLARY: GLUCOSE-CAPILLARY: 175 mg/dL — AB (ref 65–99)

## 2018-03-02 LAB — BASIC METABOLIC PANEL
ANION GAP: 9 (ref 5–15)
BUN: 34 mg/dL — AB (ref 6–20)
CHLORIDE: 106 mmol/L (ref 101–111)
CO2: 27 mmol/L (ref 22–32)
Calcium: 8.9 mg/dL (ref 8.9–10.3)
Creatinine, Ser: 1.26 mg/dL — ABNORMAL HIGH (ref 0.61–1.24)
GFR calc Af Amer: >60 mL/min (ref >60)
GFR, EST NON AFRICAN AMERICAN: 55 mL/min — AB (ref >60)
GLUCOSE: 178 mg/dL — AB (ref 65–99)
POTASSIUM: 4.4 mmol/L (ref 3.5–5.1)
Sodium: 142 mmol/L (ref 135–145)

## 2018-03-02 LAB — PROTIME-INR
INR: 1.07
PROTHROMBIN TIME: 13.8 s (ref 11.4–15.2)

## 2018-03-02 LAB — HEMOGLOBIN A1C
Hgb A1c MFr Bld: 7.7 % — ABNORMAL HIGH (ref 4.8–5.6)
MEAN PLASMA GLUCOSE: 174.29 mg/dL

## 2018-03-02 LAB — SURGICAL PCR SCREEN
MRSA, PCR: NEGATIVE
Staphylococcus aureus: NEGATIVE

## 2018-03-02 NOTE — Progress Notes (Addendum)
Cardio is Dr. Acie Fredrickson  LOV 12/2017 Echo 09/2017, Cath 05/2013 Currently denies any cp, sob. PCP is Dr. Marisue Humble, (he also manages his diabetes)  LOV 12/2017 Patient usually checks his sugar every 3-4 days.  Runs 130 - 175. Last A1C  07/2015

## 2018-03-15 NOTE — Progress Notes (Signed)
[facility]  PATIENT: Travis [name] DOB: [birth year]   REASON FOR VISIT: Follow-up for newly diagnosed obstructive sleep apnea with initial CPAP compliance. HISTORY FROM: Patient and wife    HISTORY OF PRESENT ILLNESS:UPDATE 3/20/2019CM Travis [name], 72 year old male returns for follow-up with history of newly diagnosed obstructive sleep apnea here for initial CPAP compliance.  He denies any difficulty with his machine.  He is currently being treated with a antibiotic for urinary tract infection and bronchitis.  He is due to have L4-L5 surgery by Dr. Ronnald Ramp next month.  CPAP compliance dated 02/15/2018-03/16/2018 shows compliance greater than 4 hours at 100%.  Average usage 9 hours 8 minutes.  Set pressure 5-15 cm.  EPR level 1 AHI 3.2.  He returns for reevaluation   11/11/17 CDMr. Campos is a 72 year old married Caucasian right-handed gentleman who presents for evaluation of a possible sleep disorder.  He states that he underwent a sleep study at [place] long many years ago is not sure when.  The patient is followed in this office by Dr. Jannifer Franklin for cervical spondylosis with myelopathy, which has affected gait, caused him to frequently fall, and he has developed bilateral foot drop.   He has seen Dr. Ronnald Ramp as a neurosurgeon.   He has undergone physical therapy earlier this year.  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  It appears the study was done in 2009 at [facility].  The patient also has type 2 diabetes mellitus on medication- not insulin.   Chief complaint according to patient : " I will fall asleep any time of day anywhere"   Sleep habits are as follows: The patient goes to bed around 9:30 PM usually is  asleep about 5-10 minutes later.  He describes his bedroom as conducive to sleep, cool, quiet and dark.  Travis [name] reports that the TV is usually running in the bedroom at the time her husband goes to bed and falls asleep.  It is said to a sleep timer of 20 minutes or so. patient sleeps supine, on one pillow only. He denies shortness of breath at night.  He can usually sleep for about 4-5 hours before his first bathroom break, he takes torsemide at night.  Altogether he may have for bathroom breaks at night at the most, but these to fragmented sleep.  He can go back to sleep usually. The patient does not experience enactment of dreams, nightmares, night terrors or sleepwalking.  He arises around 6 AM, spontaneously waking up at that time. He estimates that he gets about 7 hours of nocturnal sleep total.  How he feels in the morning is variable some nights he wakes up after a restless sleep and feels not rested other nights he may be refreshed and restored.  He does wake with a dry mouth, no morning headaches are described.    REVIEW OF SYSTEMS: Full 14 system review of systems performed and notable only for those listed, all others are neg:  Constitutional: neg  Cardiovascular: Leg swelling Ear/Nose/Throat: neg  Skin: neg Eyes: neg Respiratory: Currently being treated for bronchitis Gastroitestinal: Currently being treated for UTI Hematology/Lymphatic: Easy bruising Endocrine: neg Musculoskeletal: Gait abnormality Allergy/Immunology: neg Neurological: neg Psychiatric: neg Sleep : Obstructive sleep apnea with CPAP   ALLERGIES: Allergies  Allergen Reactions  .  Other Other (See Comments)    Anesthesia makes sick, must have antinausea medication in advance.  . Levofloxacin In D5w Itching    Possible infusion reaction 03/29/17. Tolerating oral Levaquin 03/30/17    HOME MEDICATIONS: Outpatient Medications Prior to Visit  Medication Sig Dispense Refill  . aspirin EC 81 MG EC tablet Take 1  tablet (81 mg total) by mouth daily. 30 tablet 0  . baclofen (LIORESAL) 10 MG tablet Take 1 tablet (10 mg total) by mouth 3 (three) times daily. For muscle spasms 90 each 1  . ciprofloxacin (CIPRO) 500 MG tablet 500 mg 2 (two) times daily. X 10 days  0  . escitalopram (LEXAPRO) 10 MG tablet Take 1 tablet (10 mg total) by mouth daily. 30 tablet 2  . gabapentin (NEURONTIN) 400 MG capsule TAKE 1 CAPSULE (400 MG TOTAL) BY MOUTH EVERY 8 (EIGHT) HOURS. 90 capsule 0  . glimepiride (AMARYL) 1 MG tablet Take 1 tablet (1 mg total) by mouth daily with breakfast. 30 tablet 0  . hydrALAZINE (APRESOLINE) 50 MG tablet Take 1 tablet (50 mg total) by mouth every 8 (eight) hours. 90 tablet 0  . levothyroxine (SYNTHROID, LEVOTHROID) 175 MCG tablet Take 1 tablet (175 mcg total) by mouth daily. 30 tablet 2  . losartan (COZAAR) 50 MG tablet Take 50 mg by mouth daily.    . polyethylene glycol (MIRALAX / GLYCOLAX) packet Take 17 g by mouth daily as needed. For constipation 14 each 0  . potassium chloride (KLOR-CON 10) 10 MEQ tablet Take 1 tablet (10 mEq total) by mouth 2 (two) times daily. 180 tablet 3  . pramipexole (MIRAPEX) 0.5 MG tablet Take 1 tablet (0.5 mg total) by mouth 2 (two) times daily. 60 tablet 0  . tamsulosin (FLOMAX) 0.4 MG CAPS capsule Take 1 capsule (0.4 mg total) by mouth daily. 30 capsule 0  . torsemide (DEMADEX) 20 MG tablet 1 tablet twice daily and 1 extra tablet daily as needed for swelling 270 tablet 3  . TOVIAZ 8 MG TB24 tablet Take 1 tablet (8 mg total) by mouth daily. 30 tablet 11  . losartan (COZAAR) 25 MG tablet Take 50 mg by mouth daily.     No facility-administered medications prior to visit.     PAST MEDICAL HISTORY: Past Medical History:  Diagnosis Date  . Cervical myelopathy (HCC)    with myelomalacia at C5-6 and C3-4  . CHF (congestive heart failure) ([place])   . Constipation - functional    controlled with miralax  . DJD (degenerative joint disease)    WHOLE SPINE  . Dyspnea     . Gait disorder   . Hypertension   . Hyperthyroidism    Had Iodine to resolve issue DX 10 YR AGO  . Hypothyroidism    Low d/t treatment of hyperthyroidism  . 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 Peripheral neuropathy   . Pneumonia   . PONV (postoperative nausea and vomiting)   . RBBB   . Renal disorder    acute kidney failure  . Restless legs syndrome 03/14/2015  . RLS (restless legs syndrome)   . Sleep apnea    DX  2009/2010 study done at [facility]  . 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 Type II diabetes mellitus ([place])    diet controlled    PAST SURGICAL HISTORY: Past Surgical History:  Procedure Laterality Date  . BACK SURGERY     6 surgeries  . CARDIAC CATHETERIZATION [name] 09/07/2015   Procedure: Right/Left  Heart Cath and Coronary Angiography;  Surgeon: Peter M Martinique, MD;  Location: [place] [facility];  Service: Cardiovascular;  Laterality: [name];  . CHOLECYSTECTOMY    . EYE SURGERY     BIL CATARACTS  . LUMBAR LAMINECTOMY/DECOMPRESSION MICRODISCECTOMY  07/14/2012   Procedure: LUMBAR LAMINECTOMY/DECOMPRESSION MICRODISCECTOMY 2 LEVELS;  Surgeon: Eustace Moore, MD;  Location: [facility];  Service: Neurosurgery;  Laterality: Left;  Lumbar two three laminectomy, left thoracic four five transpedicular diskectomy    FAMILY HISTORY: Family History  Problem Relation Age of Onset  . Polycythemia Mother   . Diabetes Father   . Hypertension Father   . Heart disease Father   . CAD Unknown        1 of his 4 brothers and father has CAD    SOCIAL HISTORY: Social History   Socioeconomic History  . Marital status: Married    Spouse name: carolyn  . Number of children: 3  . Years of education: 30  . Highest education level: Not on file  Social Needs  . Financial resource strain: Not on file  . Food insecurity - worry: Not on file  . Food insecurity - inability: Not on file  . Transportation needs - medical: Not on file  . Transportation needs - non-medical: Not on file   Occupational History    Employer: [employer]    Comment: Works in [place] Use  . Smoking status: Former Smoker    Packs/day: 2.00    Years: 20.00    Pack years: 40.00    Types: Cigarettes    Last attempt to quit: 06/28/1985    Years since quitting: 32.7  . Smokeless tobacco: Former Systems developer    Quit date: 06/28/1985  Substance and Sexual Activity  . Alcohol use: No  . Drug use: No  . Sexual activity: Not on file  Other Topics Concern  . Not on file  Social History Narrative   Lives with wife,  [name]   Patient is married with 3 children.   Patient has 16 yrs of education.   Patient is right handed.   Patient drinks 2 cups of caffeine per day.     PHYSICAL EXAM  Vitals:   03/17/18 1239  BP: 132/66  Pulse: (!) 55   There is no height or weight on file to calculate BMI.  Generalized: Well developed, obese male in no acute distress  Head: normocephalic and atraumatic,. Oropharynx benign mall[place]atti 4 Neck: Supple, circumference 21 inches Musculoskeletal: No deformity  Skin 3+ edema below the knees Neurological examination   Mentation: Alert oriented to time, place, history taking. Attention span and concentration appropriate. Recent and remote memory intact.  Follows all commands speech and language fluent.   Cranial nerve II-XII: Pupils were equal round reactive to light extraocular movements were full, visual field were full on confrontational test. Facial sensation and strength were normal. hearing was intact to finger rubbing bilaterally. Uvula tongue midline. head turning and shoulder shrug were normal and symmetric.Tongue protrusion into cheek strength was normal. Motor: normal bulk and tone, full strength in the BUE, BLE with the exception of bilateral foot drop left greater than right   Sensory: normal and symmetric to light touch,  Coordination: finger-nose-finger,  no dysmetria Gait and Station: In wheelchair  DIAGNOSTIC DATA (LABS,  IMAGING, TESTING) - I reviewed patient records, labs, notes, testing and imaging myself where available.  Lab Results  Component Value Date   WBC 6.0 03/02/2018  HGB 12.7 (L) 03/02/2018   HCT 40.7 03/02/2018   MCV 92.9 03/02/2018   PLT 192 03/02/2018      Component Value Date/Time   NA 142 03/02/2018 0941   NA 145 (H) 01/05/2018 1037   K 4.4 03/02/2018 0941   CL 106 03/02/2018 0941   CO2 27 03/02/2018 0941   GLUCOSE 178 (H) 03/02/2018 0941   BUN 34 (H) 03/02/2018 0941   BUN 39 (H) 01/05/2018 1037   CREATININE 1.26 (H) 03/02/2018 0941   CREATININE 1.66 (H) 09/24/2016 0812   CALCIUM 8.9 03/02/2018 0941   PROT 6.2 (L) 10/08/2017 0434   PROT 7.2 09/23/2017 1519   ALBUMIN 3.1 (L) 10/08/2017 0434   AST 44 (H) 10/08/2017 0434   ALT 58 10/08/2017 0434   ALKPHOS 62 10/08/2017 0434   BILITOT 1.0 10/08/2017 0434   GFRNONAA 55 (L) 03/02/2018 0941   GFRAA >60 03/02/2018 0941   Lab Results  Component Value Date   CHOL 124 08/20/2015   HDL 29 (L) 08/20/2015   LDLCALC 79 08/20/2015   TRIG 82 08/20/2015   CHOLHDL 4.3 08/20/2015   Lab Results  Component Value Date   HGBA1C 7.7 (H) 03/02/2018   Lab Results  Component Value Date   VITAMINB12 698 09/23/2017   Lab Results  Component Value Date   TSH 0.446 10/02/2017      ASSESSMENT AND PLAN  72 y.o. year old male  has a past medical history of Cervical myelopathy ([place]), CHF (congestive heart failure) ([place]), Constipation - functional, DJD (degenerative joint disease), Dyspnea, Gait disorder, Hypertension, Hyperthyroidism, Hypothyroidism, Lumbosacral spinal stenosis (03/13/2014), Peripheral neuropathy, and obstructive sleep apnea here for initial CPAP compliance.CPAP compliance dated 02/15/2018-03/16/2018 shows compliance greater than 4 hours at 100%.  Average usage 9 hours 8 minutes.  Set pressure 5-15 cm.  EPR level 1 AHI 3.2.   CPAP compliance percent greater than 4 hours reviewed data with patient Continue same  settings Follow-up in 6 months Good luck with your back surgery Dennie Bible, [facility], [facility], APRN  [facility] [address], [place], [place] [address] [phone]

## 2018-03-16 ENCOUNTER — Encounter: Payer: Self-pay | Admitting: Nurse Practitioner

## 2018-03-17 ENCOUNTER — Encounter: Payer: Self-pay | Admitting: Nurse Practitioner

## 2018-03-17 ENCOUNTER — Ambulatory Visit: Payer: Medicare Other | Admitting: Nurse Practitioner

## 2018-03-17 VITALS — BP 132/66 | HR 55

## 2018-03-17 DIAGNOSIS — G4733 Obstructive sleep apnea (adult) (pediatric): Secondary | ICD-10-CM

## 2018-03-17 DIAGNOSIS — Z9989 Dependence on other enabling machines and devices: Secondary | ICD-10-CM

## 2018-03-17 NOTE — Progress Notes (Signed)
I agree with the assessment and plan as directed by NP .The patient is known to me .   Devontay Celaya, MD  

## 2018-03-17 NOTE — Patient Instructions (Signed)
CPAP compliance percent greater than 4 hours Continue same settings Follow-up in 6 months Good luck with your back surgery

## 2018-03-31 ENCOUNTER — Encounter (HOSPITAL_COMMUNITY): Payer: Self-pay

## 2018-03-31 NOTE — Pre-Procedure Instructions (Signed)
Grafton WREN GALLAGA  03/31/2018      [facility] - [address], [place] [address] Phone: [phone] Fax: [phone]    Your procedure is scheduled on Thursday April 11.  Report to [facility] Admitting at 5:30 A.M.  Call this number if you have problems the morning of surgery:  [phone]   Remember:  Do not eat food or drink liquids after midnight.  Take these medicines the morning of surgery with A SIP OF WATER:   Escitalopram (Lexapro) Gabapentin (neurontin) Hydralazine (apresoline) tamsulosin (flomax) Toviaz  DO NOT take glimepiride (Amaryl) the morning of surgery  7 days prior to surgery STOP taking any Aspirin(unless otherwise instructed by your surgeon), Aleve, Naproxen, Ibuprofen, Motrin, Advil, Goody's, BC's, all herbal medications, fish oil, and all vitamins  **FOllow your surgeon's instructions on stopping Aspirin. If no instructions were given, please call your surgeon's office**    How to Manage Your Diabetes Before and After Surgery  Why is it important to control my blood sugar before and after surgery? . Improving blood sugar levels before and after surgery helps healing and can limit problems. . A way of improving blood sugar control is eating a healthy diet by: o  Eating less sugar and carbohydrates o  Increasing activity/exercise o  Talking with your doctor about reaching your blood sugar goals . High blood sugars (greater than 180 mg/dL) can raise your risk of infections and slow your recovery, so you will need to focus on controlling your diabetes during the weeks before surgery. . Make sure that the doctor who takes care of your diabetes knows about your planned surgery including the date and location.  How do I manage my blood sugar before surgery? . Check your blood sugar at least 4 times a day, starting 2 days before surgery, to make sure that the level is not too high or  low. o Check your blood sugar the morning of your surgery when you wake up and every 2 hours until you get to the Short Stay unit. . If your blood sugar is less than 70 mg/dL, you will need to treat for low blood sugar: o Do not take insulin. o Treat a low blood sugar (less than 70 mg/dL) with  cup of clear juice (cranberry or apple), 4 glucose tablets, OR glucose gel. Recheck blood sugar in 15 minutes after treatment (to make sure it is greater than 70 mg/dL). If your blood sugar is not greater than 70 mg/dL on recheck, call [phone] o  for further instructions. . Report your blood sugar to the short stay nurse when you get to Short Stay.  . If you are admitted to the hospital after surgery: o Your blood sugar will be checked by the staff and you will probably be given insulin after surgery (instead of oral diabetes medicines) to make sure you have good blood sugar levels. o The goal for blood sugar control after surgery is 80-180 mg/dL.               Do not wear jewelry, make-up or nail polish.  Do not wear lotions, powders, or perfumes, or deodorant.  Do not shave 48 hours prior to surgery.  Men may shave face and neck.  Do not bring valuables to the hospital.  [facility] is not responsible for any belongings or valuables.  Contacts, dentures or bridgework may not be worn into surgery.  Leave your  suitcase in the car.  After surgery it may be brought to your room.  For patients admitted to the hospital, discharge time will be determined by your treatment team.  Patients discharged the day of surgery will not be allowed to drive home.   Special instructions:    [place]- Preparing For Surgery  Before surgery, you can play an important role. Because skin is not sterile, your skin needs to be as free of germs as possible. You can reduce the number of germs on your skin by washing with CHG (chlorahexidine gluconate) Soap before surgery.  CHG is an antiseptic cleaner  which kills germs and bonds with the skin to continue killing germs even after washing.  Please do not use if you have an allergy to CHG or antibacterial soaps. If your skin becomes reddened/irritated stop using the CHG.  Do not shave (including legs and underarms) for at least 48 hours prior to first CHG shower. It is OK to shave your face.  Please follow these instructions carefully.   1. Shower the NIGHT BEFORE SURGERY and the MORNING OF SURGERY with CHG.   2. If you chose to wash your hair, wash your hair first as usual with your normal shampoo.  3. After you shampoo, rinse your hair and body thoroughly to remove the shampoo.  4. Use CHG as you would any other liquid soap. You can apply CHG directly to the skin and wash gently with a scrungie or a clean washcloth.   5. Apply the CHG Soap to your body ONLY FROM THE NECK DOWN.  Do not use on open wounds or open sores. Avoid contact with your eyes, ears, mouth and genitals (private parts). Wash Face and genitals (private parts)  with your normal soap.  6. Wash thoroughly, paying special attention to the area where your surgery will be performed.  7. Thoroughly rinse your body with warm water from the neck down.  8. DO NOT shower/wash with your normal soap after using and rinsing off the CHG Soap.  9. Pat yourself dry with a CLEAN TOWEL.  10. Wear CLEAN PAJAMAS to bed the night before surgery, wear comfortable clothes the morning of surgery  11. Place CLEAN SHEETS on your bed the night of your first shower and DO NOT SLEEP WITH PETS.    Day of Surgery: Do not apply any deodorants/lotions. Please wear clean clothes to the hospital/surgery center.      Please read over the following fact sheets that you were given. Coughing and Deep Breathing, MRSA Information and Surgical Site Infection Prevention

## 2018-04-01 ENCOUNTER — Ambulatory Visit (HOSPITAL_COMMUNITY)
Admission: RE | Admit: 2018-04-01 | Discharge: 2018-04-01 | Disposition: A | Discharge status: Home / Self Care | Payer: Medicare Other | Source: Ambulatory Visit | Attending: Anesthesiology | Admitting: Anesthesiology

## 2018-04-01 ENCOUNTER — Encounter (HOSPITAL_COMMUNITY): Payer: Self-pay

## 2018-04-01 ENCOUNTER — Other Ambulatory Visit: Payer: Self-pay

## 2018-04-01 ENCOUNTER — Encounter (HOSPITAL_COMMUNITY)
Admission: RE | Admit: 2018-04-01 | Discharge: 2018-04-01 | Disposition: A | Discharge status: Home / Self Care | Payer: Medicare Other | Source: Ambulatory Visit | Attending: Neurological Surgery | Admitting: Neurological Surgery

## 2018-04-01 DIAGNOSIS — Z01812 Encounter for preprocedural laboratory examination: Secondary | ICD-10-CM | POA: Insufficient documentation

## 2018-04-01 DIAGNOSIS — J9811 Atelectasis: Secondary | ICD-10-CM | POA: Insufficient documentation

## 2018-04-01 DIAGNOSIS — Z87891 Personal history of nicotine dependence: Secondary | ICD-10-CM | POA: Diagnosis not present

## 2018-04-01 DIAGNOSIS — E785 Hyperlipidemia, unspecified: Secondary | ICD-10-CM | POA: Diagnosis not present

## 2018-04-01 DIAGNOSIS — I130 Hypertensive heart and chronic kidney disease with heart failure and stage 1 through stage 4 chronic kidney disease, or unspecified chronic kidney disease: Secondary | ICD-10-CM | POA: Diagnosis not present

## 2018-04-01 DIAGNOSIS — I2510 Atherosclerotic heart disease of native coronary artery without angina pectoris: Secondary | ICD-10-CM | POA: Insufficient documentation

## 2018-04-01 DIAGNOSIS — M4326 Fusion of spine, lumbar region: Secondary | ICD-10-CM | POA: Diagnosis not present

## 2018-04-01 DIAGNOSIS — Z8701 Personal history of pneumonia (recurrent): Secondary | ICD-10-CM | POA: Diagnosis not present

## 2018-04-01 DIAGNOSIS — I509 Heart failure, unspecified: Secondary | ICD-10-CM | POA: Diagnosis not present

## 2018-04-01 DIAGNOSIS — Z9889 Other specified postprocedural states: Secondary | ICD-10-CM | POA: Diagnosis not present

## 2018-04-01 DIAGNOSIS — N183 Chronic kidney disease, stage 3 (moderate): Secondary | ICD-10-CM | POA: Insufficient documentation

## 2018-04-01 DIAGNOSIS — E039 Hypothyroidism, unspecified: Secondary | ICD-10-CM | POA: Diagnosis not present

## 2018-04-01 DIAGNOSIS — G4733 Obstructive sleep apnea (adult) (pediatric): Secondary | ICD-10-CM | POA: Diagnosis not present

## 2018-04-01 DIAGNOSIS — Z01810 Encounter for preprocedural cardiovascular examination: Secondary | ICD-10-CM | POA: Diagnosis present

## 2018-04-01 DIAGNOSIS — E1122 Type 2 diabetes mellitus with diabetic chronic kidney disease: Secondary | ICD-10-CM | POA: Diagnosis not present

## 2018-04-01 HISTORY — DX: Chronic obstructive pulmonary disease, unspecified: J44.9

## 2018-04-01 LAB — GLUCOSE, CAPILLARY: Glucose-Capillary: 153 mg/dL — ABNORMAL HIGH (ref 65–99)

## 2018-04-01 LAB — CBC
HEMATOCRIT: 40 % (ref 39.0–52.0)
HEMOGLOBIN: 12.3 g/dL — AB (ref 13.0–17.0)
MCH: 29 pg (ref 26.0–34.0)
MCHC: 30.8 g/dL (ref 30.0–36.0)
MCV: 94.3 fL (ref 78.0–100.0)
PLATELETS: 168 10*3/uL (ref 150–400)
RBC: 4.24 MIL/uL (ref 4.22–5.81)
RDW: 16.9 % — ABNORMAL HIGH (ref 11.5–15.5)
WBC: 5.5 10*3/uL (ref 4.0–10.5)

## 2018-04-01 LAB — BASIC METABOLIC PANEL
ANION GAP: 11 (ref 5–15)
BUN: 29 mg/dL — ABNORMAL HIGH (ref 6–20)
CHLORIDE: 104 mmol/L (ref 101–111)
CO2: 27 mmol/L (ref 22–32)
CREATININE: 1.32 mg/dL — AB (ref 0.61–1.24)
Calcium: 8.9 mg/dL (ref 8.9–10.3)
GFR calc Af Amer: >60 mL/min (ref >60)
GFR calc non Af Amer: 52 mL/min — ABNORMAL LOW (ref >60)
Glucose, Bld: 167 mg/dL — ABNORMAL HIGH (ref 65–99)
POTASSIUM: 4.1 mmol/L (ref 3.5–5.1)
Sodium: 142 mmol/L (ref 135–145)

## 2018-04-01 LAB — TYPE AND SCREEN
ABO/RH(D): O POS
Antibody Screen: NEGATIVE

## 2018-04-01 LAB — SURGICAL PCR SCREEN
MRSA, PCR: NEGATIVE
Staphylococcus aureus: NEGATIVE

## 2018-04-01 NOTE — Progress Notes (Signed)
Anesthesia consult:   Pt is a 72 year old male scheduled for L4-5, L5-S1 laminectomy and foraminotomy, L4-5 instrumented posterolateral fusion, T-2 laminectomy and foraminotomy on 04/08/2018 with Sherley Bounds, MD  Surgery was originally scheduled for 03/10/18 but was postponed due to pneumonia  Providers:  - PCP is [name], MD  - Cardiologist is Mertie Moores, MD who cleared pt for surgery at moderate to high risk at last office visit 02/05/18, noting "Would recommend that that he have a surgery in the hospital and he will likely need several days of observation and medication adjustment. We will be happy to follow him around the time of his surgery."  - Pulmonologist is Marshell Garfinkel, MD. 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 Former smoker. BMI 38. S/p lumbar laminectomy/decompression 07/14/12. S/p lumbar fusion 02/11/12  - Hospitalized 10/5-10/11/18 for UTI, acute on chronic CHF, acute respiratory failure with hypoxia (treated with lasix and O2), acute on chronic CKD  - Hospitalized 3/29-04/01/17 for acute on chronic CHF, possible CAP, acute respiratory failure with hypoxia, UTI   Medications include: ASA 81mg , glimepiride, hydralazine, levothyroxine, losartan, potassium, torsemide  BP (!) 144/61   Pulse 62   Temp (!) 36.4 C   Resp 20   Ht 6\' 1"  (1.854 m)   Wt 292 lb (132.5 kg)   SpO2 93%   BMI 38.52 kg/m    Preoperative labs reviewed.   -  glucose 167. HbA1c 7.7 on 03/02/18  CXR 03/31/18: Suboptimal inspiration with basilar atelectasis left-greater-than-right. Recommend follow-up.  EKG 11/03/17: NSR. RBBB.   Renal US 10/02/17:  1. No acute findings. Specifically, no hydroureteronephrosis. 2. Indeterminate lesion in the lower pole of the left kidney measuring 2.2 x 2.0 x 2.0 cm. This  is new compared to the prior abdominal ultrasound 07/14/2014. Further characterization with nonemergent MRI of the abdomen with and without IV gadolinium should be considered in the near future to exclude underlying neoplasm.  CT angio chest 10/02/17:  1. Exam detail diminished secondary to suboptimal pulmonary arterial opacification and respiratory motion artifact. No saddle embolus or proximal lobar pulmonary artery filling defects identified. 2. Increase caliber of the main pulmonary artery suggestive of PA hypertension. 3. Bilateral lower lobe subsegmental atelectasis and asymmetric elevation of the right hemidiaphragm. 4. Aortic Atherosclerosis (ICD10-I70.0). Coronary artery calcifications noted involving the LAD, left circumflex and RCA.  Echo 03/06/17:  - Left ventricle: The cavity size was normal. Wall thickness wasincreased in a pattern of mild LVH. Systolic function wasvigorous. The estimated ejection fraction was in the range of 65%to 70%. Wall motion was normal; there were no regional wallmotion abnormalities. Doppler parameters are consistent with pseudonormal left ventricular relaxation (grade 2 diastolicdysfunction). The E/A ratio is >1.5. The E/e&' ratio is >15,suggesting elevated LV filling pressure. - Left atrium: The atrium was mildly dilated. - Inferior vena cava: The vessel was normal in size. Therespirophasic diameter changes were in the normal range (>= 50%),consistent with normal central venous pressure. - Impressions: Compared to a prior study in 2016, there is no significant change. LV filling pressure is elevated.  Cardiac cath 09/07/15:   Prox RCA to Mid RCA lesion, 40% stenosed.  Ost Cx to Prox Cx lesion, 50% stenosed.  LM lesion, 40% stenosed.  The left ventricular systolic function is normal.  Dist LAD lesion, 40% stenosed. 1. Nonobstructive CAD 2. Normal LV function. 3.  Mild pulmonary HTN with normal LV filling pressures.   Saw pt at pre-admission  testing appointment. Pt with significant 3+ pitting edema up to his thighs.  No c/o SOB, CP.  States he feels recovered from pneumonia. Lungs CTA B however lung sounds are diminished.  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  Notes by Dr. Acie Fredrickson indicate he expects pt will need adjustment of CHF meds post-op.   Reviewed case with Dr. Gifford Shave. Pt will need further assessment day of surgery by assigned anesthesiologist. If no changes, I anticipate pt can proceed with surgery as scheduled.   Willeen Cass, FNP-BC [facility]/Anesthesiology Phone: [phone] 04/02/2018 4:33 PM

## 2018-04-01 NOTE — Progress Notes (Signed)
PCP is Dr. [name]  Cardiologist is Dr Acie Fredrickson Denies chest pain, cough, or fever. Reports fasting CBG's run 140-160's States he had pneumonia a month ago, but is better now. (surgery had to be postponed)   Instructed to bring CPAP mask on the day of surgery.  Reports he stopped taking aspirin 2-3 weeks ago. Requested to have Dr Smith Robert  Due to pneumonia history will need cxr today.

## 2018-04-07 ENCOUNTER — Encounter (HOSPITAL_COMMUNITY): Payer: Self-pay | Admitting: General Practice

## 2018-04-07 NOTE — Anesthesia Preprocedure Evaluation (Addendum)
Anesthesia Evaluation  Patient identified by MRN, date of birth, ID band Patient awake    Reviewed: Allergy & Precautions, NPO status , Patient's Chart, lab work & pertinent test results  History of Anesthesia Complications (+) PONV and history of anesthetic complications  Airway Mallampati: III  TM Distance: >3 FB Neck ROM: Full    Dental  (+) Upper Dentures, Dental Advisory Given   Pulmonary shortness of breath, sleep apnea , pneumonia, resolved, COPD, former smoker,     + decreased breath sounds      Cardiovascular hypertension, Pt. on medications +CHF and + DOE   Rhythm:Regular Rate:Normal     Neuro/Psych  Neuromuscular disease negative psych ROS   GI/Hepatic Neg liver ROS,   Endo/Other  diabetes, Type 2, Oral Hypoglycemic AgentsHypothyroidism   Renal/GU Renal disease     Musculoskeletal  (+) Arthritis ,   Abdominal (+) + obese,   Peds  Hematology  (+) anemia ,   Anesthesia Other Findings   Reproductive/Obstetrics                           Lab Results  Component Value Date   WBC 5.5 04/01/2018   HGB 12.3 (L) 04/01/2018   HCT 40.0 04/01/2018   MCV 94.3 04/01/2018   PLT 168 04/01/2018   Lab Results  Component Value Date   CREATININE 1.32 (H) 04/01/2018   BUN 29 (H) 04/01/2018   NA 142 04/01/2018   K 4.1 04/01/2018   CL 104 04/01/2018   CO2 27 04/01/2018   Lab Results  Component Value Date   INR 1.07 03/02/2018   INR 1.09 03/26/2017   INR 1.0 08/27/2015   EKG: NSR w/ RBBB  Echo: - Left ventricle: The cavity size was normal. Wall thickness was   increased in a pattern of mild LVH. Systolic function was   vigorous. The estimated ejection fraction was in the range of 65%   to 70%. Wall motion was normal; there were no regional wall   motion abnormalities. Doppler parameters are consistent with   pseudonormal left ventricular relaxation (grade 2 diastolic   dysfunction).  The E/A ratio is >1.5. The E/e&' ratio is >15,   suggesting elevated LV filling pressure. - Left atrium: The atrium was mildly dilated. - Inferior vena cava: The vessel was normal in size. The   respirophasic diameter changes were in the normal range (>= 50%),   consistent with normal central venous pressure.  Anesthesia Physical Anesthesia Plan  ASA: II  Anesthesia Plan: General   Post-op Pain Management:    Induction: Intravenous  PONV Risk Score and Plan: 4 or greater and Ondansetron, Dexamethasone, Midazolam and Scopolamine patch - Pre-op  Airway Management Planned: Oral ETT  Additional Equipment: None  Intra-op Plan:   Post-operative Plan: Extubation in OR  Informed Consent: I have reviewed the patients History and Physical, chart, labs and discussed the procedure including the risks, benefits and alternatives for the proposed anesthesia with the patient or authorized representative who has indicated his/her understanding and acceptance.   Dental advisory given and Dental Advisory Given  Plan Discussed with: CRNA  Anesthesia Plan Comments:       Anesthesia Quick Evaluation

## 2018-04-08 ENCOUNTER — Inpatient Hospital Stay (HOSPITAL_COMMUNITY)
Admission: RE | Admit: 2018-04-08 | Discharge: 2018-04-16 | DRG: 459 | Disposition: A | Discharge status: Rehabilitation Facility | Payer: Medicare Other | Source: Ambulatory Visit | Attending: Neurological Surgery | Admitting: Neurological Surgery

## 2018-04-08 ENCOUNTER — Encounter (HOSPITAL_COMMUNITY): Payer: Self-pay | Admitting: *Deleted

## 2018-04-08 ENCOUNTER — Inpatient Hospital Stay (HOSPITAL_COMMUNITY)
Admission: RE | Disposition: A | Discharge status: Rehabilitation Facility | Payer: Self-pay | Source: Ambulatory Visit | Attending: Neurological Surgery

## 2018-04-08 ENCOUNTER — Inpatient Hospital Stay (HOSPITAL_COMMUNITY): Payer: Medicare Other

## 2018-04-08 ENCOUNTER — Inpatient Hospital Stay (HOSPITAL_COMMUNITY): Payer: Medicare Other | Admitting: Vascular Surgery

## 2018-04-08 ENCOUNTER — Other Ambulatory Visit: Payer: Self-pay

## 2018-04-08 DIAGNOSIS — Z6841 Body Mass Index (BMI) 40.0 and over, adult: Secondary | ICD-10-CM

## 2018-04-08 DIAGNOSIS — R0902 Hypoxemia: Secondary | ICD-10-CM | POA: Diagnosis not present

## 2018-04-08 DIAGNOSIS — N183 Chronic kidney disease, stage 3 unspecified: Secondary | ICD-10-CM | POA: Diagnosis present

## 2018-04-08 DIAGNOSIS — J449 Chronic obstructive pulmonary disease, unspecified: Secondary | ICD-10-CM | POA: Diagnosis present

## 2018-04-08 DIAGNOSIS — Z9989 Dependence on other enabling machines and devices: Secondary | ICD-10-CM | POA: Diagnosis not present

## 2018-04-08 DIAGNOSIS — R338 Other retention of urine: Secondary | ICD-10-CM

## 2018-04-08 DIAGNOSIS — R0989 Other specified symptoms and signs involving the circulatory and respiratory systems: Secondary | ICD-10-CM | POA: Diagnosis not present

## 2018-04-08 DIAGNOSIS — I4510 Unspecified right bundle-branch block: Secondary | ICD-10-CM | POA: Diagnosis present

## 2018-04-08 DIAGNOSIS — Z981 Arthrodesis status: Secondary | ICD-10-CM | POA: Diagnosis not present

## 2018-04-08 DIAGNOSIS — Z87891 Personal history of nicotine dependence: Secondary | ICD-10-CM

## 2018-04-08 DIAGNOSIS — G9340 Encephalopathy, unspecified: Secondary | ICD-10-CM | POA: Diagnosis present

## 2018-04-08 DIAGNOSIS — G931 Anoxic brain damage, not elsewhere classified: Secondary | ICD-10-CM

## 2018-04-08 DIAGNOSIS — E876 Hypokalemia: Secondary | ICD-10-CM | POA: Diagnosis not present

## 2018-04-08 DIAGNOSIS — E1165 Type 2 diabetes mellitus with hyperglycemia: Secondary | ICD-10-CM | POA: Diagnosis not present

## 2018-04-08 DIAGNOSIS — R5381 Other malaise: Secondary | ICD-10-CM | POA: Diagnosis not present

## 2018-04-08 DIAGNOSIS — Z9842 Cataract extraction status, left eye: Secondary | ICD-10-CM

## 2018-04-08 DIAGNOSIS — R0689 Other abnormalities of breathing: Secondary | ICD-10-CM | POA: Diagnosis not present

## 2018-04-08 DIAGNOSIS — E1122 Type 2 diabetes mellitus with diabetic chronic kidney disease: Secondary | ICD-10-CM | POA: Diagnosis present

## 2018-04-08 DIAGNOSIS — I5043 Acute on chronic combined systolic (congestive) and diastolic (congestive) heart failure: Secondary | ICD-10-CM | POA: Diagnosis not present

## 2018-04-08 DIAGNOSIS — Z79899 Other long term (current) drug therapy: Secondary | ICD-10-CM

## 2018-04-08 DIAGNOSIS — Z87440 Personal history of urinary (tract) infections: Secondary | ICD-10-CM

## 2018-04-08 DIAGNOSIS — M5030 Other cervical disc degeneration, unspecified cervical region: Secondary | ICD-10-CM

## 2018-04-08 DIAGNOSIS — E1140 Type 2 diabetes mellitus with diabetic neuropathy, unspecified: Secondary | ICD-10-CM | POA: Diagnosis present

## 2018-04-08 DIAGNOSIS — A419 Sepsis, unspecified organism: Secondary | ICD-10-CM | POA: Diagnosis not present

## 2018-04-08 DIAGNOSIS — J9622 Acute and chronic respiratory failure with hypercapnia: Secondary | ICD-10-CM | POA: Diagnosis not present

## 2018-04-08 DIAGNOSIS — N401 Enlarged prostate with lower urinary tract symptoms: Secondary | ICD-10-CM

## 2018-04-08 DIAGNOSIS — I493 Ventricular premature depolarization: Secondary | ICD-10-CM | POA: Diagnosis not present

## 2018-04-08 DIAGNOSIS — N179 Acute kidney failure, unspecified: Secondary | ICD-10-CM | POA: Diagnosis not present

## 2018-04-08 DIAGNOSIS — K5900 Constipation, unspecified: Secondary | ICD-10-CM | POA: Diagnosis present

## 2018-04-08 DIAGNOSIS — G2581 Restless legs syndrome: Secondary | ICD-10-CM | POA: Diagnosis present

## 2018-04-08 DIAGNOSIS — Z9841 Cataract extraction status, right eye: Secondary | ICD-10-CM

## 2018-04-08 DIAGNOSIS — M4804 Spinal stenosis, thoracic region: Secondary | ICD-10-CM | POA: Diagnosis present

## 2018-04-08 DIAGNOSIS — G4733 Obstructive sleep apnea (adult) (pediatric): Secondary | ICD-10-CM | POA: Diagnosis present

## 2018-04-08 DIAGNOSIS — Z8679 Personal history of other diseases of the circulatory system: Secondary | ICD-10-CM | POA: Diagnosis not present

## 2018-04-08 DIAGNOSIS — G9589 Other specified diseases of spinal cord: Secondary | ICD-10-CM | POA: Diagnosis not present

## 2018-04-08 DIAGNOSIS — N189 Chronic kidney disease, unspecified: Secondary | ICD-10-CM | POA: Diagnosis not present

## 2018-04-08 DIAGNOSIS — G8929 Other chronic pain: Secondary | ICD-10-CM | POA: Diagnosis present

## 2018-04-08 DIAGNOSIS — I5040 Unspecified combined systolic (congestive) and diastolic (congestive) heart failure: Secondary | ICD-10-CM | POA: Diagnosis not present

## 2018-04-08 DIAGNOSIS — Z8249 Family history of ischemic heart disease and other diseases of the circulatory system: Secondary | ICD-10-CM

## 2018-04-08 DIAGNOSIS — J9811 Atelectasis: Secondary | ICD-10-CM | POA: Diagnosis not present

## 2018-04-08 DIAGNOSIS — G959 Disease of spinal cord, unspecified: Secondary | ICD-10-CM | POA: Diagnosis not present

## 2018-04-08 DIAGNOSIS — R7309 Other abnormal glucose: Secondary | ICD-10-CM | POA: Diagnosis not present

## 2018-04-08 DIAGNOSIS — Z419 Encounter for procedure for purposes other than remedying health state, unspecified: Secondary | ICD-10-CM

## 2018-04-08 DIAGNOSIS — Z7984 Long term (current) use of oral hypoglycemic drugs: Secondary | ICD-10-CM

## 2018-04-08 DIAGNOSIS — G8250 Quadriplegia, unspecified: Secondary | ICD-10-CM | POA: Diagnosis not present

## 2018-04-08 DIAGNOSIS — K5901 Slow transit constipation: Secondary | ICD-10-CM | POA: Diagnosis not present

## 2018-04-08 DIAGNOSIS — I5033 Acute on chronic diastolic (congestive) heart failure: Secondary | ICD-10-CM | POA: Diagnosis not present

## 2018-04-08 DIAGNOSIS — E039 Hypothyroidism, unspecified: Secondary | ICD-10-CM | POA: Diagnosis present

## 2018-04-08 DIAGNOSIS — G8918 Other acute postprocedural pain: Secondary | ICD-10-CM | POA: Diagnosis not present

## 2018-04-08 DIAGNOSIS — R000 Tachycardia, unspecified: Secondary | ICD-10-CM | POA: Diagnosis not present

## 2018-04-08 DIAGNOSIS — J9690 Respiratory failure, unspecified, unspecified whether with hypoxia or hypercapnia: Secondary | ICD-10-CM

## 2018-04-08 DIAGNOSIS — M48061 Spinal stenosis, lumbar region without neurogenic claudication: Secondary | ICD-10-CM | POA: Diagnosis not present

## 2018-04-08 DIAGNOSIS — I878 Other specified disorders of veins: Secondary | ICD-10-CM | POA: Diagnosis present

## 2018-04-08 DIAGNOSIS — R011 Cardiac murmur, unspecified: Secondary | ICD-10-CM | POA: Diagnosis present

## 2018-04-08 DIAGNOSIS — E669 Obesity, unspecified: Secondary | ICD-10-CM | POA: Diagnosis not present

## 2018-04-08 DIAGNOSIS — I2721 Secondary pulmonary arterial hypertension: Secondary | ICD-10-CM | POA: Diagnosis present

## 2018-04-08 DIAGNOSIS — E1142 Type 2 diabetes mellitus with diabetic polyneuropathy: Secondary | ICD-10-CM | POA: Diagnosis not present

## 2018-04-08 DIAGNOSIS — D649 Anemia, unspecified: Secondary | ICD-10-CM | POA: Diagnosis present

## 2018-04-08 DIAGNOSIS — N390 Urinary tract infection, site not specified: Secondary | ICD-10-CM | POA: Diagnosis not present

## 2018-04-08 DIAGNOSIS — Z9981 Dependence on supplemental oxygen: Secondary | ICD-10-CM | POA: Diagnosis not present

## 2018-04-08 DIAGNOSIS — J9621 Acute and chronic respiratory failure with hypoxia: Secondary | ICD-10-CM | POA: Diagnosis not present

## 2018-04-08 DIAGNOSIS — N170 Acute kidney failure with tubular necrosis: Secondary | ICD-10-CM | POA: Diagnosis not present

## 2018-04-08 DIAGNOSIS — Z79890 Hormone replacement therapy (postmenopausal): Secondary | ICD-10-CM

## 2018-04-08 DIAGNOSIS — D62 Acute posthemorrhagic anemia: Secondary | ICD-10-CM | POA: Diagnosis not present

## 2018-04-08 DIAGNOSIS — R4781 Slurred speech: Secondary | ICD-10-CM | POA: Diagnosis not present

## 2018-04-08 DIAGNOSIS — E1169 Type 2 diabetes mellitus with other specified complication: Secondary | ICD-10-CM | POA: Diagnosis not present

## 2018-04-08 DIAGNOSIS — Z833 Family history of diabetes mellitus: Secondary | ICD-10-CM

## 2018-04-08 DIAGNOSIS — R251 Tremor, unspecified: Secondary | ICD-10-CM | POA: Diagnosis not present

## 2018-04-08 DIAGNOSIS — R0602 Shortness of breath: Secondary | ICD-10-CM | POA: Diagnosis not present

## 2018-04-08 DIAGNOSIS — G8220 Paraplegia, unspecified: Secondary | ICD-10-CM | POA: Diagnosis not present

## 2018-04-08 DIAGNOSIS — I10 Essential (primary) hypertension: Secondary | ICD-10-CM

## 2018-04-08 DIAGNOSIS — I130 Hypertensive heart and chronic kidney disease with heart failure and stage 1 through stage 4 chronic kidney disease, or unspecified chronic kidney disease: Secondary | ICD-10-CM | POA: Diagnosis not present

## 2018-04-08 DIAGNOSIS — Z9049 Acquired absence of other specified parts of digestive tract: Secondary | ICD-10-CM

## 2018-04-08 DIAGNOSIS — Z993 Dependence on wheelchair: Secondary | ICD-10-CM

## 2018-04-08 DIAGNOSIS — D696 Thrombocytopenia, unspecified: Secondary | ICD-10-CM | POA: Diagnosis not present

## 2018-04-08 DIAGNOSIS — E872 Acidosis: Secondary | ICD-10-CM | POA: Diagnosis not present

## 2018-04-08 DIAGNOSIS — Z7982 Long term (current) use of aspirin: Secondary | ICD-10-CM

## 2018-04-08 DIAGNOSIS — R509 Fever, unspecified: Secondary | ICD-10-CM

## 2018-04-08 DIAGNOSIS — Z4789 Encounter for other orthopedic aftercare: Secondary | ICD-10-CM | POA: Diagnosis not present

## 2018-04-08 DIAGNOSIS — Z888 Allergy status to other drugs, medicaments and biological substances status: Secondary | ICD-10-CM

## 2018-04-08 HISTORY — PX: POSTERIOR LUMBAR FUSION: SHX6036

## 2018-04-08 HISTORY — PX: POSTERIOR CERVICAL FUSION/FORAMINOTOMY: SHX5038

## 2018-04-08 LAB — GLUCOSE, CAPILLARY
Glucose-Capillary: 178 mg/dL — ABNORMAL HIGH (ref 65–99)
Glucose-Capillary: 205 mg/dL — ABNORMAL HIGH (ref 65–99)
Glucose-Capillary: 228 mg/dL — ABNORMAL HIGH (ref 65–99)
Glucose-Capillary: 262 mg/dL — ABNORMAL HIGH (ref 65–99)

## 2018-04-08 SURGERY — POSTERIOR LUMBAR FUSION 2 LEVEL
Anesthesia: General | Site: Thoracic

## 2018-04-08 MED ORDER — MEPERIDINE HCL 50 MG/ML IJ SOLN: 6.2500 mg | INTRAMUSCULAR | Status: DC | PRN

## 2018-04-08 MED ORDER — HYDRALAZINE HCL 50 MG PO TABS
50.0000 mg | ORAL_TABLET | Freq: Three times a day (TID) | ORAL | Status: DC
  Administered 2018-04-08 – 2018-04-11 (×3): 50 mg via ORAL
  Filled 2018-04-08 (×5): qty 1

## 2018-04-08 MED ORDER — ARTIFICIAL TEARS OPHTHALMIC OINT
TOPICAL_OINTMENT | OPHTHALMIC | Status: DC | PRN
  Administered 2018-04-08: 1 via OPHTHALMIC

## 2018-04-08 MED ORDER — ONDANSETRON HCL 4 MG/2ML IJ SOLN
INTRAMUSCULAR | Status: AC
  Filled 2018-04-08: qty 2

## 2018-04-08 MED ORDER — SODIUM CHLORIDE 0.9 % IJ SOLN
INTRAMUSCULAR | Status: AC
  Filled 2018-04-08: qty 30

## 2018-04-08 MED ORDER — SCOPOLAMINE 1 MG/3DAYS TD PT72
MEDICATED_PATCH | TRANSDERMAL | Status: DC | PRN
  Administered 2018-04-08: 1 via TRANSDERMAL

## 2018-04-08 MED ORDER — PHENYLEPHRINE HCL 10 MG/ML IJ SOLN
INTRAVENOUS | Status: DC | PRN
  Administered 2018-04-08: 50 ug/min via INTRAVENOUS
  Administered 2018-04-08: 12:00:00 via INTRAVENOUS

## 2018-04-08 MED ORDER — LIDOCAINE 2% (20 MG/ML) 5 ML SYRINGE
INTRAMUSCULAR | Status: AC
  Filled 2018-04-08: qty 5

## 2018-04-08 MED ORDER — TORSEMIDE 20 MG PO TABS
20.0000 mg | ORAL_TABLET | Freq: Every day | ORAL | Status: DC
  Administered 2018-04-08 – 2018-04-09 (×2): 20 mg via ORAL
  Filled 2018-04-08 (×2): qty 1

## 2018-04-08 MED ORDER — CHLORHEXIDINE GLUCONATE CLOTH 2 % EX PADS: 6.0000 | MEDICATED_PAD | Freq: Once | CUTANEOUS | Status: DC

## 2018-04-08 MED ORDER — PROMETHAZINE HCL 25 MG/ML IJ SOLN
6.2500 mg | INTRAMUSCULAR | Status: DC | PRN
Start: 2018-04-08 — End: 2018-04-08

## 2018-04-08 MED ORDER — MIDAZOLAM HCL 2 MG/2ML IJ SOLN
INTRAMUSCULAR | Status: AC
  Filled 2018-04-08: qty 2

## 2018-04-08 MED ORDER — METHOCARBAMOL 500 MG PO TABS
500.0000 mg | ORAL_TABLET | Freq: Four times a day (QID) | ORAL | Status: DC | PRN
Start: 2018-04-08 — End: 2018-04-09

## 2018-04-08 MED ORDER — LEVOTHYROXINE SODIUM 75 MCG PO TABS
175.0000 ug | ORAL_TABLET | Freq: Every day | ORAL | Status: DC
  Administered 2018-04-09 – 2018-04-16 (×8): 175 ug via ORAL
  Filled 2018-04-08 (×8): qty 1

## 2018-04-08 MED ORDER — PHENYLEPHRINE HCL 10 MG/ML IJ SOLN
INTRAMUSCULAR | Status: AC
  Filled 2018-04-08: qty 1

## 2018-04-08 MED ORDER — THROMBIN (RECOMBINANT) 20000 UNITS EX SOLR
CUTANEOUS | Status: DC | PRN
  Administered 2018-04-08: 20 mL via TOPICAL

## 2018-04-08 MED ORDER — OXYCODONE HCL 5 MG PO TABS
10.0000 mg | ORAL_TABLET | ORAL | Status: DC | PRN
  Administered 2018-04-10: 10 mg via ORAL
  Filled 2018-04-08: qty 2

## 2018-04-08 MED ORDER — EPHEDRINE SULFATE 50 MG/ML IJ SOLN
INTRAMUSCULAR | Status: DC | PRN
  Administered 2018-04-08 (×2): 10 mg via INTRAVENOUS

## 2018-04-08 MED ORDER — SODIUM CHLORIDE 0.9 % IV SOLN: 250.0000 mL | INTRAVENOUS | Status: DC

## 2018-04-08 MED ORDER — LACTATED RINGERS IV SOLN: INTRAVENOUS | Status: DC

## 2018-04-08 MED ORDER — CEFAZOLIN SODIUM 10 G IJ SOLR
3.0000 g | INTRAMUSCULAR | Status: AC
  Administered 2018-04-08 (×2): 3 g via INTRAVENOUS
  Filled 2018-04-08: qty 3

## 2018-04-08 MED ORDER — VANCOMYCIN HCL 1000 MG IV SOLR
INTRAVENOUS | Status: AC
  Filled 2018-04-08: qty 1000

## 2018-04-08 MED ORDER — LACTATED RINGERS IV SOLN
INTRAVENOUS | Status: DC | PRN
  Administered 2018-04-08: 12:00:00 via INTRAVENOUS

## 2018-04-08 MED ORDER — ROCURONIUM BROMIDE 10 MG/ML (PF) SYRINGE
PREFILLED_SYRINGE | INTRAVENOUS | Status: AC
  Filled 2018-04-08: qty 5

## 2018-04-08 MED ORDER — THROMBIN (RECOMBINANT) 5000 UNITS EX SOLR
OROMUCOSAL | Status: DC | PRN
  Administered 2018-04-08 (×2): 5 mL via TOPICAL

## 2018-04-08 MED ORDER — ACETAMINOPHEN 650 MG RE SUPP
650.0000 mg | RECTAL | Status: DC | PRN
  Administered 2018-04-11: 650 mg via RECTAL
  Filled 2018-04-08 (×2): qty 1

## 2018-04-08 MED ORDER — ALBUTEROL SULFATE HFA 108 (90 BASE) MCG/ACT IN AERS
INHALATION_SPRAY | RESPIRATORY_TRACT | Status: DC | PRN
  Administered 2018-04-08: 2 via RESPIRATORY_TRACT

## 2018-04-08 MED ORDER — BACLOFEN 10 MG PO TABS
10.0000 mg | ORAL_TABLET | Freq: Three times a day (TID) | ORAL | Status: DC
  Administered 2018-04-08 – 2018-04-09 (×3): 10 mg via ORAL
  Filled 2018-04-08 (×3): qty 1

## 2018-04-08 MED ORDER — TAMSULOSIN HCL 0.4 MG PO CAPS
0.4000 mg | ORAL_CAPSULE | Freq: Every day | ORAL | Status: DC
  Administered 2018-04-08 – 2018-04-16 (×9): 0.4 mg via ORAL
  Filled 2018-04-08 (×9): qty 1

## 2018-04-08 MED ORDER — PRAMIPEXOLE DIHYDROCHLORIDE 0.25 MG PO TABS
0.5000 mg | ORAL_TABLET | Freq: Two times a day (BID) | ORAL | Status: DC
  Administered 2018-04-08 – 2018-04-16 (×15): 0.5 mg via ORAL
  Filled 2018-04-08 (×9): qty 2
  Filled 2018-04-08: qty 4
  Filled 2018-04-08 (×5): qty 2
  Filled 2018-04-08: qty 4
  Filled 2018-04-08: qty 2

## 2018-04-08 MED ORDER — SODIUM CHLORIDE 0.9% FLUSH: 3.0000 mL | INTRAVENOUS | Status: DC | PRN

## 2018-04-08 MED ORDER — DEXAMETHASONE SODIUM PHOSPHATE 10 MG/ML IJ SOLN
10.0000 mg | INTRAMUSCULAR | Status: AC
  Administered 2018-04-08: 10 mg via INTRAVENOUS
  Filled 2018-04-08: qty 1

## 2018-04-08 MED ORDER — DEXAMETHASONE SODIUM PHOSPHATE 10 MG/ML IJ SOLN
INTRAMUSCULAR | Status: AC
  Filled 2018-04-08: qty 1

## 2018-04-08 MED ORDER — LACTATED RINGERS IV SOLN
INTRAVENOUS | Status: DC | PRN
  Administered 2018-04-08 (×2): via INTRAVENOUS

## 2018-04-08 MED ORDER — METHOCARBAMOL 1000 MG/10ML IJ SOLN
500.0000 mg | Freq: Four times a day (QID) | INTRAMUSCULAR | Status: DC | PRN
  Filled 2018-04-08: qty 5

## 2018-04-08 MED ORDER — SODIUM CHLORIDE 0.9% FLUSH
3.0000 mL | Freq: Two times a day (BID) | INTRAVENOUS | Status: DC
  Administered 2018-04-08 – 2018-04-09 (×2): 3 mL via INTRAVENOUS

## 2018-04-08 MED ORDER — BACITRACIN ZINC 500 UNIT/GM EX OINT
TOPICAL_OINTMENT | CUTANEOUS | Status: DC | PRN
  Administered 2018-04-08: 1 via TOPICAL

## 2018-04-08 MED ORDER — THROMBIN 5000 UNITS EX SOLR
CUTANEOUS | Status: AC
  Filled 2018-04-08: qty 5000

## 2018-04-08 MED ORDER — MENTHOL 3 MG MT LOZG: 1.0000 | LOZENGE | OROMUCOSAL | Status: DC | PRN

## 2018-04-08 MED ORDER — SODIUM CHLORIDE 0.9 % IV SOLN
INTRAVENOUS | Status: DC | PRN
  Administered 2018-04-08: 500 mL

## 2018-04-08 MED ORDER — CEFAZOLIN SODIUM-DEXTROSE 2-4 GM/100ML-% IV SOLN
INTRAVENOUS | Status: AC
  Filled 2018-04-08: qty 100

## 2018-04-08 MED ORDER — BUPIVACAINE HCL (PF) 0.25 % IJ SOLN
INTRAMUSCULAR | Status: DC | PRN
  Administered 2018-04-08: 3 mL

## 2018-04-08 MED ORDER — INSULIN ASPART 100 UNIT/ML ~~LOC~~ SOLN
SUBCUTANEOUS | Status: AC
  Filled 2018-04-08: qty 1

## 2018-04-08 MED ORDER — ALBUTEROL SULFATE HFA 108 (90 BASE) MCG/ACT IN AERS
INHALATION_SPRAY | RESPIRATORY_TRACT | Status: AC
  Filled 2018-04-08: qty 13.4

## 2018-04-08 MED ORDER — PROPOFOL 10 MG/ML IV BOLUS
INTRAVENOUS | Status: DC | PRN
  Administered 2018-04-08: 150 mg via INTRAVENOUS
  Administered 2018-04-08: 30 mg via INTRAVENOUS

## 2018-04-08 MED ORDER — INSULIN ASPART 100 UNIT/ML ~~LOC~~ SOLN
5.0000 [IU] | Freq: Once | SUBCUTANEOUS | Status: AC
  Administered 2018-04-08: 5 [IU] via SUBCUTANEOUS

## 2018-04-08 MED ORDER — HYDROMORPHONE HCL 1 MG/ML IJ SOLN: 0.2500 mg | INTRAMUSCULAR | Status: DC | PRN

## 2018-04-08 MED ORDER — SUCCINYLCHOLINE CHLORIDE 200 MG/10ML IV SOSY
PREFILLED_SYRINGE | INTRAVENOUS | Status: AC
  Filled 2018-04-08: qty 10

## 2018-04-08 MED ORDER — ROCURONIUM BROMIDE 100 MG/10ML IV SOLN
INTRAVENOUS | Status: DC | PRN
  Administered 2018-04-08: 10 mg via INTRAVENOUS
  Administered 2018-04-08: 60 mg via INTRAVENOUS
  Administered 2018-04-08 (×3): 20 mg via INTRAVENOUS

## 2018-04-08 MED ORDER — ONDANSETRON HCL 4 MG/2ML IJ SOLN
INTRAMUSCULAR | Status: DC | PRN
  Administered 2018-04-08 (×2): 4 mg via INTRAVENOUS

## 2018-04-08 MED ORDER — HYDROMORPHONE HCL 1 MG/ML IJ SOLN: 0.5000 mg | INTRAMUSCULAR | Status: DC | PRN

## 2018-04-08 MED ORDER — PHENYLEPHRINE HCL 10 MG/ML IJ SOLN
INTRAMUSCULAR | Status: DC | PRN
  Administered 2018-04-08 (×2): 80 ug via INTRAVENOUS

## 2018-04-08 MED ORDER — BUPIVACAINE HCL (PF) 0.25 % IJ SOLN
INTRAMUSCULAR | Status: AC
  Filled 2018-04-08: qty 30

## 2018-04-08 MED ORDER — FENTANYL CITRATE (PF) 250 MCG/5ML IJ SOLN
INTRAMUSCULAR | Status: AC
  Filled 2018-04-08: qty 5

## 2018-04-08 MED ORDER — GLIMEPIRIDE 1 MG PO TABS
1.0000 mg | ORAL_TABLET | Freq: Every day | ORAL | Status: DC
  Administered 2018-04-09 – 2018-04-10 (×2): 1 mg via ORAL
  Filled 2018-04-08 (×3): qty 1

## 2018-04-08 MED ORDER — ONDANSETRON HCL 4 MG PO TABS: 4.0000 mg | ORAL_TABLET | Freq: Four times a day (QID) | ORAL | Status: DC | PRN

## 2018-04-08 MED ORDER — ACETAMINOPHEN 325 MG PO TABS
650.0000 mg | ORAL_TABLET | ORAL | Status: DC | PRN
Start: 2018-04-08 — End: 2018-04-16
  Administered 2018-04-11 – 2018-04-15 (×2): 650 mg via ORAL
  Filled 2018-04-08 (×2): qty 2

## 2018-04-08 MED ORDER — FESOTERODINE FUMARATE ER 8 MG PO TB24
8.0000 mg | ORAL_TABLET | Freq: Every day | ORAL | Status: DC
  Administered 2018-04-09 – 2018-04-16 (×8): 8 mg via ORAL
  Filled 2018-04-08 (×8): qty 1

## 2018-04-08 MED ORDER — VANCOMYCIN HCL 1000 MG IV SOLR
INTRAVENOUS | Status: DC | PRN
  Administered 2018-04-08: 1000 mg via TOPICAL

## 2018-04-08 MED ORDER — CEFAZOLIN SODIUM-DEXTROSE 2-4 GM/100ML-% IV SOLN
2.0000 g | Freq: Three times a day (TID) | INTRAVENOUS | Status: AC
  Administered 2018-04-08 – 2018-04-09 (×2): 2 g via INTRAVENOUS
  Filled 2018-04-08 (×2): qty 100

## 2018-04-08 MED ORDER — FENTANYL CITRATE (PF) 100 MCG/2ML IJ SOLN
INTRAMUSCULAR | Status: DC | PRN
  Administered 2018-04-08 (×4): 50 ug via INTRAVENOUS

## 2018-04-08 MED ORDER — SENNA 8.6 MG PO TABS
1.0000 | ORAL_TABLET | Freq: Two times a day (BID) | ORAL | Status: DC
  Administered 2018-04-08 – 2018-04-16 (×14): 8.6 mg via ORAL
  Filled 2018-04-08 (×14): qty 1

## 2018-04-08 MED ORDER — SCOPOLAMINE 1 MG/3DAYS TD PT72
MEDICATED_PATCH | TRANSDERMAL | Status: AC
  Filled 2018-04-08: qty 1

## 2018-04-08 MED ORDER — INSULIN ASPART 100 UNIT/ML ~~LOC~~ SOLN
0.0000 [IU] | Freq: Three times a day (TID) | SUBCUTANEOUS | Status: DC
  Administered 2018-04-09: 5 [IU] via SUBCUTANEOUS
  Administered 2018-04-09 – 2018-04-10 (×2): 3 [IU] via SUBCUTANEOUS
  Administered 2018-04-10: 2 [IU] via SUBCUTANEOUS
  Administered 2018-04-10: 3 [IU] via SUBCUTANEOUS
  Administered 2018-04-11: 2 [IU] via SUBCUTANEOUS
  Administered 2018-04-11 – 2018-04-12 (×4): 3 [IU] via SUBCUTANEOUS
  Administered 2018-04-12: 2 [IU] via SUBCUTANEOUS
  Administered 2018-04-13 (×2): 5 [IU] via SUBCUTANEOUS
  Administered 2018-04-13 – 2018-04-15 (×6): 3 [IU] via SUBCUTANEOUS
  Administered 2018-04-15: 5 [IU] via SUBCUTANEOUS
  Administered 2018-04-16: 2 [IU] via SUBCUTANEOUS
  Administered 2018-04-16: 5 [IU] via SUBCUTANEOUS

## 2018-04-08 MED ORDER — ESCITALOPRAM OXALATE 10 MG PO TABS
10.0000 mg | ORAL_TABLET | Freq: Every day | ORAL | Status: DC
  Administered 2018-04-08 – 2018-04-16 (×9): 10 mg via ORAL
  Filled 2018-04-08 (×9): qty 1

## 2018-04-08 MED ORDER — PHENOL 1.4 % MT LIQD: 1.0000 | OROMUCOSAL | Status: DC | PRN

## 2018-04-08 MED ORDER — GABAPENTIN 300 MG PO CAPS
400.0000 mg | ORAL_CAPSULE | Freq: Three times a day (TID) | ORAL | Status: DC
  Administered 2018-04-08 – 2018-04-16 (×21): 400 mg via ORAL
  Filled 2018-04-08 (×23): qty 1

## 2018-04-08 MED ORDER — MIDAZOLAM HCL 5 MG/5ML IJ SOLN
INTRAMUSCULAR | Status: DC | PRN
  Administered 2018-04-08: 1 mg via INTRAVENOUS

## 2018-04-08 MED ORDER — BACITRACIN ZINC 500 UNIT/GM EX OINT
TOPICAL_OINTMENT | CUTANEOUS | Status: AC
  Filled 2018-04-08: qty 28.35

## 2018-04-08 MED ORDER — SUGAMMADEX SODIUM 500 MG/5ML IV SOLN
INTRAVENOUS | Status: AC
  Filled 2018-04-08: qty 5

## 2018-04-08 MED ORDER — THROMBIN 20000 UNITS EX SOLR
CUTANEOUS | Status: AC
  Filled 2018-04-08: qty 20000

## 2018-04-08 MED ORDER — LIDOCAINE HCL (CARDIAC) 20 MG/ML IV SOLN
INTRAVENOUS | Status: DC | PRN
  Administered 2018-04-08: 50 mg via INTRAVENOUS

## 2018-04-08 MED ORDER — 0.9 % SODIUM CHLORIDE (POUR BTL) OPTIME
TOPICAL | Status: DC | PRN
  Administered 2018-04-08 (×2): 1000 mL

## 2018-04-08 MED ORDER — CELECOXIB 200 MG PO CAPS
200.0000 mg | ORAL_CAPSULE | Freq: Two times a day (BID) | ORAL | Status: DC
  Administered 2018-04-08 – 2018-04-09 (×2): 200 mg via ORAL
  Filled 2018-04-08 (×3): qty 1

## 2018-04-08 MED ORDER — ONDANSETRON HCL 4 MG/2ML IJ SOLN
4.0000 mg | Freq: Four times a day (QID) | INTRAMUSCULAR | Status: DC | PRN
  Administered 2018-04-09 – 2018-04-15 (×3): 4 mg via INTRAVENOUS
  Filled 2018-04-08 (×3): qty 2

## 2018-04-08 MED ORDER — LOSARTAN POTASSIUM 50 MG PO TABS
50.0000 mg | ORAL_TABLET | Freq: Every day | ORAL | Status: DC
  Administered 2018-04-08 – 2018-04-10 (×3): 50 mg via ORAL
  Filled 2018-04-08 (×3): qty 1

## 2018-04-08 MED ORDER — PROPOFOL 10 MG/ML IV BOLUS
INTRAVENOUS | Status: AC
  Filled 2018-04-08: qty 20

## 2018-04-08 MED ORDER — SUGAMMADEX SODIUM 500 MG/5ML IV SOLN
INTRAVENOUS | Status: DC | PRN
  Administered 2018-04-08: 500 mg via INTRAVENOUS

## 2018-04-08 MED ORDER — HEPARIN SODIUM (PORCINE) 1000 UNIT/ML IJ SOLN
INTRAMUSCULAR | Status: AC
  Filled 2018-04-08: qty 1

## 2018-04-08 MED ORDER — POTASSIUM CHLORIDE IN NACL 20-0.9 MEQ/L-% IV SOLN
INTRAVENOUS | Status: DC
  Filled 2018-04-08: qty 1000

## 2018-04-08 SURGICAL SUPPLY — 79 items
ADH SKN CLS APL DERMABOND .7 (GAUZE/BANDAGES/DRESSINGS) ×4
APL SKNCLS STERI-STRIP NONHPOA (GAUZE/BANDAGES/DRESSINGS) ×4
BAG DECANTER FOR FLEXI CONT (MISCELLANEOUS) ×5 IMPLANT
BASKET BONE COLLECTION (BASKET) ×3 IMPLANT
BENZOIN TINCTURE PRP APPL 2/3 (GAUZE/BANDAGES/DRESSINGS) ×6 IMPLANT
BLADE CLIPPER SURG (BLADE) IMPLANT
BUR MATCHSTICK NEURO 3.0 LAGG (BURR) ×4 IMPLANT
CANISTER SUCT 3000ML PPV (MISCELLANEOUS) ×6 IMPLANT
CARTRIDGE OIL MAESTRO DRILL (MISCELLANEOUS) ×4 IMPLANT
CONT SPEC 4OZ CLIKSEAL STRL BL (MISCELLANEOUS) ×3 IMPLANT
COVER BACK TABLE 60X90IN (DRAPES) ×3 IMPLANT
DERMABOND ADVANCED (GAUZE/BANDAGES/DRESSINGS) ×2
DERMABOND ADVANCED .7 DNX12 (GAUZE/BANDAGES/DRESSINGS) ×2 IMPLANT
DIFFUSER DRILL AIR PNEUMATIC (MISCELLANEOUS) ×6 IMPLANT
DRAPE C-ARM 42X72 X-RAY (DRAPES) ×9 IMPLANT
DRAPE C-ARMOR (DRAPES) ×3 IMPLANT
DRAPE LAPAROTOMY 100X72 PEDS (DRAPES) ×3 IMPLANT
DRAPE LAPAROTOMY 100X72X124 (DRAPES) ×3 IMPLANT
DRAPE POUCH INSTRU U-SHP 10X18 (DRAPES) ×4 IMPLANT
DRAPE SURG 17X23 STRL (DRAPES) ×3 IMPLANT
DRSG OPSITE POSTOP 4X6 (GAUZE/BANDAGES/DRESSINGS) ×2 IMPLANT
DURAPREP 26ML APPLICATOR (WOUND CARE) ×6 IMPLANT
ELECT BLADE 4.0 EZ CLEAN MEGAD (MISCELLANEOUS) ×3
ELECT REM PT RETURN 9FT ADLT (ELECTROSURGICAL) ×6
ELECTRODE BLDE 4.0 EZ CLN MEGD (MISCELLANEOUS) IMPLANT
ELECTRODE REM PT RTRN 9FT ADLT (ELECTROSURGICAL) ×4 IMPLANT
EVACUATOR 1/8 PVC DRAIN (DRAIN) ×2 IMPLANT
GAUZE SPONGE 4X4 16PLY XRAY LF (GAUZE/BANDAGES/DRESSINGS) IMPLANT
GLOVE BIO SURGEON STRL SZ7 (GLOVE) ×2 IMPLANT
GLOVE BIO SURGEON STRL SZ8 (GLOVE) ×11 IMPLANT
GLOVE BIO SURGEON STRL SZ8.5 (GLOVE) ×1 IMPLANT
GLOVE BIOGEL PI IND STRL 6.5 (GLOVE) IMPLANT
GLOVE BIOGEL PI IND STRL 7.0 (GLOVE) IMPLANT
GLOVE BIOGEL PI IND STRL 7.5 (GLOVE) IMPLANT
GLOVE BIOGEL PI INDICATOR 6.5 (GLOVE) ×2
GLOVE BIOGEL PI INDICATOR 7.0 (GLOVE) ×9
GLOVE BIOGEL PI INDICATOR 7.5 (GLOVE) ×3
GLOVE SURG SS PI 6.0 STRL IVOR (GLOVE) ×3 IMPLANT
GOWN STRL REUS W/ TWL LRG LVL3 (GOWN DISPOSABLE) IMPLANT
GOWN STRL REUS W/ TWL XL LVL3 (GOWN DISPOSABLE) ×6 IMPLANT
GOWN STRL REUS W/TWL 2XL LVL3 (GOWN DISPOSABLE) IMPLANT
GOWN STRL REUS W/TWL LRG LVL3 (GOWN DISPOSABLE) ×18
GOWN STRL REUS W/TWL XL LVL3 (GOWN DISPOSABLE) ×12
HEMOSTAT POWDER KIT SURGIFOAM (HEMOSTASIS) ×2 IMPLANT
KIT BASIN OR (CUSTOM PROCEDURE TRAY) ×6 IMPLANT
KIT BONE MRW ASP ANGEL CPRP (KITS) ×1 IMPLANT
KIT TURNOVER KIT B (KITS) ×6 IMPLANT
MARKER SKIN DUAL TIP RULER LAB (MISCELLANEOUS) ×3 IMPLANT
MILL MEDIUM DISP (BLADE) ×1 IMPLANT
NDL HYPO 18GX1.5 BLUNT FILL (NEEDLE) IMPLANT
NDL HYPO 25X1 1.5 SAFETY (NEEDLE) ×4 IMPLANT
NDL SPNL 20GX3.5 QUINCKE YW (NEEDLE) IMPLANT
NEEDLE HYPO 18GX1.5 BLUNT FILL (NEEDLE) ×6 IMPLANT
NEEDLE HYPO 25X1 1.5 SAFETY (NEEDLE) ×6 IMPLANT
NEEDLE SPNL 20GX3.5 QUINCKE YW (NEEDLE) IMPLANT
NS IRRIG 1000ML POUR BTL (IV SOLUTION) ×6 IMPLANT
OIL CARTRIDGE MAESTRO DRILL (MISCELLANEOUS) ×6
PACK LAMINECTOMY NEURO (CUSTOM PROCEDURE TRAY) ×6 IMPLANT
PAD ARMBOARD 7.5X6 YLW CONV (MISCELLANEOUS) ×14 IMPLANT
PIN MAYFIELD SKULL DISP (PIN) ×3 IMPLANT
PUTTY DBM ALLOSYNC PURE 5CC (Putty) ×1 IMPLANT
ROD CBX 35 (Rod) ×2 IMPLANT
SCREW CBX 5.5X45 ARSENAL (Screw) ×4 IMPLANT
SCREW SET SPINAL ARSENAL 47127 (Screw) ×4 IMPLANT
SPONGE LAP 4X18 X RAY DECT (DISPOSABLE) IMPLANT
SPONGE SURGIFOAM ABS GEL 100 (HEMOSTASIS) ×3 IMPLANT
SPONGE SURGIFOAM ABS GEL SZ50 (HEMOSTASIS) ×2 IMPLANT
STRIP CLOSURE SKIN 1/2X4 (GAUZE/BANDAGES/DRESSINGS) ×9 IMPLANT
SUT VIC AB 0 CT1 18XCR BRD8 (SUTURE) ×4 IMPLANT
SUT VIC AB 0 CT1 8-18 (SUTURE) ×9
SUT VIC AB 2-0 CP2 18 (SUTURE) ×6 IMPLANT
SUT VIC AB 3-0 SH 8-18 (SUTURE) ×10 IMPLANT
SYR 20CC LL (SYRINGE) ×1 IMPLANT
SYR 5ML LL (SYRINGE) ×1 IMPLANT
SYR CONTROL 10ML LL (SYRINGE) ×3 IMPLANT
TOWEL GREEN STERILE (TOWEL DISPOSABLE) ×5 IMPLANT
TOWEL GREEN STERILE FF (TOWEL DISPOSABLE) ×6 IMPLANT
TRAY FOLEY W/METER SILVER 16FR (SET/KITS/TRAYS/PACK) ×3 IMPLANT
WATER STERILE IRR 1000ML POUR (IV SOLUTION) ×5 IMPLANT

## 2018-04-08 NOTE — Anesthesia Procedure Notes (Signed)
Procedure Name: Intubation Date/Time: 04/08/2018 8:03 AM Performed by: Neldon Newport, CRNA Pre-anesthesia Checklist: Timeout performed, Patient being monitored, Suction available, Emergency Drugs available and Patient identified Patient Re-evaluated:Patient Re-evaluated prior to induction Oxygen Delivery Method: Circle system utilized Preoxygenation: Pre-oxygenation with 100% oxygen Induction Type: IV induction Ventilation: Mask ventilation without difficulty and Oral airway inserted - appropriate to patient size Laryngoscope Size: Glidescope and 3 Grade View: Grade I Tube type: Oral Tube size: 7.5 mm Number of attempts: 1 Placement Confirmation: breath sounds checked- equal and bilateral and positive ETCO2 Secured at: 23 cm Tube secured with: Tape Dental Injury: Teeth and Oropharynx as per pre-operative assessment  Difficulty Due To: Difficulty was anticipated Comments: Neck immobile.  Limited mouth opening.  Easy mask with oral airway.

## 2018-04-08 NOTE — Op Note (Signed)
04/08/2018  1:29 PM  PATIENT:  Travis [name]  72 y.o. male  PRE-OPERATIVE DIAGNOSIS:  1. 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 Thoracic spinal stenosis T1 and T2, 3. Back pain and gait instability and leg numbness  POST-OPERATIVE DIAGNOSIS:  same  PROCEDURE:  1. Decompressive lumbar laminectomy, hemi-facetectomy and foraminotomy L4-5 bilaterally for surgical canal decompression and nerve root decompression, 2. Intertransverse arthrodesis L4-5 bilaterally utilizing locally harvested morselized autologous bone graft and morcellized allograft soaked in bone marrow aspirate obtained through a separate fascial incision over the right iliac crest, 3. Nonsegmental fixation L4-5 utilizing Alphatec cortical pedicle screws, 4. Through a separate incision, partial C7 and full T1 laminectomy and medial facetectomy and foraminotomy  SURGEON:  Sherley Bounds, MD  ASSISTANTS: Dr. Arnoldo Morale  ANESTHESIA:   General  EBL: 300 ml  Total I/O In: 2118 [I.V.:2018; IV Piggyback:100] Out: 600 [Urine:300; Blood:300]  BLOOD ADMINISTERED: none  DRAINS: None  SPECIMEN:  none  INDICATION FOR PROCEDURE: This patient presented with back pain, gait disturbance, numbness in the legs. Imaging showed adjacent level stenosis T1 -2 and recurrent severe spinal stenosis L4-5. The patient tried conservative measures without relief. Pain was debilitating. Recommended T1-2 laminectomy and redo L4-5 decompression followed by instrument fusion. Patient understood the risks, benefits, and alternatives and potential outcomes and wished to proceed.     PROCEDURE DETAILS:  The patient was brought to the operating room. After induction of generalized endotracheal anesthesia the patient was rolled into the prone position on chest rolls and all pressure points were padded. The patient's lumbar region was cleaned and then prepped with DuraPrep and draped in the usual sterile fashion. Anesthesia was injected and then a dorsal  midline incision was made and carried down to the lumbosacral fascia. The fascia was opened and the paraspinous musculature was taken down in a subperiosteal fashion to expose L4-5 bilaterally. I exposed over the facets to expose the transverse processes of L4 and L5. There was significant scar in the midline. I dissected in a suprafascial plane to expose the right iliac crest. I used a Jamshidi needle to extract 60 mL of bone marrow aspirate from the iliac crest and this was then spun down to achieve 3 mL to place on our morcellized allograft for later arthrodesis. I closed the fascia over the iliac crest and over the suprafascial dissection. A self-retaining retractor was placed. Intraoperative fluoroscopy confirmed my level, and I started with placement of the L4 cortical pedicle screws. The pedicle screw entry zones were identified utilizing surface landmarks and  AP and lateral fluoroscopy. I scored the cortex with the high-speed drill and then used the hand drill to drill an upward and outward direction into the pedicle. I then tapped line to line. I then placed a 5.5 x 45 mm cortical pedicle screw into the pedicles of L4 bilaterally. I then turned my attention to the decompression and complete lumbar laminectomies, hemi- facetectomies, and foraminotomies were performed at L4-5. The patient had significant spinal stenosis and this required more work,, especially given the previous decompression of the scar tissue involved. Much more generous decompression and generous foraminotomy was undertaken in order to adequately decompress the neural elements and address the patient's leg pain. The yellow ligament was removed to expose the underlying dura and nerve roots, and generous foraminotomies were performed to adequately decompress the neural elements. Both the exiting and traversing nerve roots were decompressed on both sides until a coronary dilator passed easily along the nerve roots.  We  then turned our  attention to the placement of the lower pedicle screws. The pedicle screw entry zones were identified utilizing surface landmarks and fluoroscopy. I drilled into each pedicle utilizing the hand drill, and tapped each pedicle with the appropriate tap. We palpated with a ball probe to assure no break in the cortex. We then placed 5.5 x 45 mm cortical pedicle screws into the pedicles bilaterally at L5. We then decorticated the transverse processes and laid a mixture of morcellized autograft and allograft out over these to perform intertransverse arthrodesis at L4-5. We then placed lordotic rods into the multiaxial screw heads of the pedicle screws and locked these in position with the locking caps and anti-torque device. We then checked our construct with AP and lateral fluoroscopy. Irrigated with copious amounts of bacitracin-containing saline solution. Inspected the nerve roots once again to assure adequate decompression, lined to the dura with Gelfoam, placed powdered vancomycin into the wound, and closed the muscle and the fascia with 0 Vicryl. Closed the subcutaneous tissues with 2-0 Vicryl and subcuticular tissues with 3-0 Vicryl. The skin was closed with benzoin and Steri-Strips. Dressing was then applied.. At the end of the procedure all sponge, needle and instrument counts were correct.    The patient was then turned back onto the stretcher for the posterior cervicothoracic decompression.  The patient was affixed a 3 point [place] headrest and rolled into the prone position on chest rolls. All pressure points were padded. The posterior cervical and upper thoracic region was cleaned and prepped with DuraPrep and then draped in the usual sterile fashion. 7 cc of local anesthesia was injected and a dorsal midline incision made in the posterior cervical region and upper thoracic region and carried down to the cervical and thoracic fascia. The fascia was opened and the paraspinous musculature was taken down to  expose T1 and T2. The old pedicle screws were identified at T1.. Intraoperative fluoroscopy confirmed my level and then the dissection was carried out over the lateral facets. We chose AP fluoroscopy because we could not see through his shoulders on lateral fluoroscopy but felt that we were at the T2 pedicle which was the starting point over decompression to extend up to the bottom of C7. Again, it was difficult to assess the appropriate level with fluoroscopy, but we felt given the imaging combined with finding the T1 pedicle screws that we were at the correct level for the decompression. I then gently decompressed the central canal with the high-speed drill and 1 and 2 mm Kerrison punch from the top of T2 to the midportion of C7. Medial facetectomies were performed. Once the decompression was complete the dura was full and capacious and I could see the spinal cord pulsatile through the dura.  I irrigated with saline solution containing bacitracin. I lined the dura with Gelfoam. After hemostasis was achieved I closed the muscle and the fascia with 0 Vicryl, subcutaneous tissue with 2-0 Vicryl, and the subcuticular tissue with 3-0 Vicryl. The skin was closed with benzoin and Steri-Strips. A sterile dressing was applied, the patient was turned to the supine position and taken out of the headrest, awakened from general anesthesia and transferred to the recovery room in stable condition. At the end of the procedure all sponge, needle and instrument counts were correct.    PLAN OF CARE: Admit to inpatient   PATIENT DISPOSITION:  PACU - hemodynamically stable.   Delay start of Pharmacological VTE agent (>24hrs) due to surgical blood loss or risk of  bleeding:  yes

## 2018-04-08 NOTE — Anesthesia Postprocedure Evaluation (Signed)
Anesthesia Post Note  Patient: Piper P Poteete  Procedure(s) Performed: Laminectomy and Foraminotomy - Lumbar Four-Lumbar Five, instrumented posterolateral fusion Lumbar Four- Five, Laminectomy and Foraminotomy - Thoracic One-Thoracic Two ([name] Spine Lumbar) Laminectomy and Foraminotomy - Lumbar Four-Lumbar Five, instrumented posterolateral fusion Lumbar Four- Five, Laminectomy and Foraminotomy - Thoracic One-Thoracic Two (Thoracic)     Patient location during evaluation: PACU Anesthesia Type: General Level of consciousness: awake and alert Pain management: pain level controlled Vital Signs Assessment: post-procedure vital signs reviewed and stable Respiratory status: spontaneous breathing, nonlabored ventilation, respiratory function stable and patient connected to nasal cannula oxygen Cardiovascular status: blood pressure returned to baseline and stable Postop Assessment: no apparent nausea or vomiting Anesthetic complications: no    Last Vitals:  Vitals:   04/08/18 1515 04/08/18 1530  BP: 112/62 (!) 110/58  Pulse: 75 77  Resp: 16 17  Temp:    SpO2: 94% 93%    Last Pain:  Vitals:   04/08/18 1530  TempSrc:   PainSc: Asleep                 Effie Berkshire

## 2018-04-08 NOTE — Consult Note (Signed)
Cardiology Consultation:   Patient ID: Travis [name]; [id number]; Jul 16, 1946   Admit date: 04/08/2018 Date of Consult: 04/08/2018  Primary Care Provider: [name], MD Primary Cardiologist: Mertie Moores, MD  Primary Electrophysiologist:     Patient Profile:   Travis [name] is a 72 y.o. male with a hx of chronic diastolic congestive heart failure, obstructive sleep apnea, hypertension, diabetes mellitus, mild pulmonary hypertension who is being seen today for the evaluation of chronic diastolic congestive heart failure at the request of Dr. Ronnald Ramp.  History of Present Illness:   Travis [name] 72 year old gentleman who have known for several years.  He has had chronic diastolic congestive heart failure.  He has occasional episodes of acute heart failure-mostly due to  dietary indiscretion.  He has had severe leg weakness.  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  Today he had decompressive lumbar laminectomy  Past Medical History:  Diagnosis Date  . Cervical myelopathy (HCC)    with myelomalacia at C5-6 and C3-4  . CHF (congestive heart failure) ([place])   . Constipation - functional    controlled with miralax  . COPD (chronic obstructive pulmonary disease) (HCC)    mild  . DJD (degenerative joint disease)    WHOLE SPINE  . Dyspnea   . Gait disorder   . Heart murmur    years ago   . Hypertension   . Hypothyroidism    Low d/t treatment of hyperthyroidism  . 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 Peripheral neuropathy   . Pneumonia   . PONV (postoperative nausea and vomiting)   . RBBB   . Renal disorder    acute kidney failure  . Restless legs syndrome 03/14/2015  . RLS (restless legs syndrome)   . Sleep apnea    DX  2009/2010 [facility]  . 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 Type II diabetes mellitus ([place])    diet controlled    Past Surgical History:  Procedure Laterality Date  . BACK SURGERY     6 surgeries  .  CARDIAC CATHETERIZATION [name] 09/07/2015   Procedure: Right/Left Heart Cath and Coronary Angiography;  Surgeon: Peter M Martinique, MD;  Location: [facility];  Service: Cardiovascular;  Laterality: [name];  . CHOLECYSTECTOMY    . EYE SURGERY     BIL CATARACTS  . LUMBAR LAMINECTOMY/DECOMPRESSION MICRODISCECTOMY  07/14/2012   Procedure: LUMBAR LAMINECTOMY/DECOMPRESSION MICRODISCECTOMY 2 LEVELS;  Surgeon: Eustace Moore, MD;  Location: [facility];  Service: Neurosurgery;  Laterality: Left;  Lumbar two three laminectomy, left thoracic four five transpedicular diskectomy     Home Medications:  Prior to Admission medications   Medication Sig Start Date End Date Taking? Authorizing Provider  aspirin EC 81 MG EC tablet Take 1 tablet (81 mg total) by mouth daily. 08/23/15  Yes Thurnell Lose, MD  baclofen (LIORESAL) 10 MG tablet Take 1 tablet (10 mg total) by mouth 3 (three) times daily. For muscle spasms 10/20/17  Yes Angiulli, Lavon Paganini, [name]  escitalopram (LEXAPRO) 10 MG tablet Take 1 tablet (10 mg total) by mouth daily. 10/20/17  Yes Angiulli, Lavon Paganini, [name]  gabapentin (NEURONTIN) 400 MG capsule TAKE 1 CAPSULE (400 MG TOTAL) BY MOUTH EVERY 8 (EIGHT) HOURS. 10/20/17  Yes Angiulli, Lavon Paganini, [name]  glimepiride (AMARYL) 1 MG tablet Take 1 tablet (1 mg total) by mouth daily with breakfast. 10/20/17  Yes Angiulli, Lavon Paganini, [name]  hydrALAZINE (APRESOLINE) 50 MG tablet  Take 1 tablet (50 mg total) by mouth every 8 (eight) hours. 10/20/17  Yes Angiulli, Lavon Paganini, [name]  levothyroxine (SYNTHROID, LEVOTHROID) 175 MCG tablet Take 1 tablet (175 mcg total) by mouth daily. 10/20/17  Yes Angiulli, Lavon Paganini, [name]  losartan (COZAAR) 50 MG tablet Take 50 mg by mouth daily.   Yes [provider]  polyethylene glycol (MIRALAX / GLYCOLAX) packet Take 17 g by mouth daily as needed. For constipation 08/09/14  Yes Love, Ivan Anchors, [name]  potassium chloride (KLOR-CON 10) 10 MEQ tablet Take 1 tablet (10 mEq total) by mouth  2 (two) times daily. 10/20/17  Yes Angiulli, Lavon Paganini, [name]  pramipexole (MIRAPEX) 0.5 MG tablet Take 1 tablet (0.5 mg total) by mouth 2 (two) times daily. 10/20/17  Yes Angiulli, Lavon Paganini, [name]  tamsulosin (FLOMAX) 0.4 MG CAPS capsule Take 1 capsule (0.4 mg total) by mouth daily. 10/20/17  Yes Angiulli, Lavon Paganini, [name]  torsemide (DEMADEX) 20 MG tablet 1 tablet twice daily and 1 extra tablet daily as needed for swelling 12/21/17  Yes Sequan Auxier, Wonda Cheng, MD  TOVIAZ 8 MG TB24 tablet Take 1 tablet (8 mg total) by mouth daily. 10/20/17  Yes Angiulli, Lavon Paganini, [name]  ciprofloxacin (CIPRO) 500 MG tablet 500 mg 2 (two) times daily. X 10 days 03/08/18   [provider]    Inpatient Medications: Scheduled Meds: . Chlorhexidine Gluconate Cloth  6 each Topical Once   And  . Chlorhexidine Gluconate Cloth  6 each Topical Once  . insulin aspart       Continuous Infusions: . lactated ringers    . methocarbamol (ROBAXIN)  IV     PRN Meds: HYDROmorphone (DILAUDID) injection, meperidine (DEMEROL) injection, methocarbamol **OR** methocarbamol (ROBAXIN)  IV, oxyCODONE, promethazine  Allergies:    Allergies  Allergen Reactions  . Other Other (See Comments)    ANESTHESIA UNSPECIFIED  -- makes sick, must have antinausea medication in advance.  . Levofloxacin In D5w Itching    Possible infusion reaction 03/29/17. Tolerating oral Levaquin 03/30/17    Social History:   Social History   Socioeconomic History  . Marital status: Married    Spouse name: carolyn  . Number of children: 3  . Years of education: 55  . Highest education level: Not on file  Occupational History    Employer: [employer]: Works in [place]  . Financial resource strain: Not on file  . Food insecurity:    Worry: Not on file    Inability: Not on file  . Transportation needs:    Medical: Not on file    Non-medical: Not on file  Tobacco Use  . Smoking status: Former Smoker      Packs/day: 2.00    Years: 20.00    Pack years: 40.00    Types: Cigarettes    Last attempt to quit: 06/28/1985    Years since quitting: 32.8  . Smokeless tobacco: Former Systems developer    Quit date: 06/28/1985  Substance and Sexual Activity  . Alcohol use: No  . Drug use: No  . Sexual activity: Not on file  Lifestyle  . Physical activity:    Days per week: Not on file    Minutes per session: Not on file  . Stress: Not on file  Relationships  . Social connections:    Talks on phone: Not on file    Gets together: Not on file    Attends religious service: Not on file  Active member of club or organization: Not on file    Attends meetings of clubs or organizations: Not on file    Relationship status: Not on file  . Intimate partner violence:    Fear of current or ex partner: Not on file    Emotionally abused: Not on file    Physically abused: Not on file    Forced sexual activity: Not on file  Other Topics Concern  . Not on file  Social History Narrative   Lives with wife,  [name]   Patient is married with 3 children.   Patient has 16 yrs of education.   Patient is right handed.   Patient drinks 2 cups of caffeine per day.    Family History:    Family History  Problem Relation Age of Onset  . Polycythemia Mother   . Diabetes Father   . Hypertension Father   . Heart disease Father   . CAD Unknown        1 of his 4 brothers and father has CAD     ROS:  Please see the history of present illness.  All other ROS reviewed and negative.     Physical Exam/Data:   Vitals:   04/08/18 1445 04/08/18 1500 04/08/18 1515 04/08/18 1530  BP: (!) 106/51 (!) 101/57 112/62 (!) 110/58  Pulse: 79 77 75 77  Resp: 20 13 16 17   Temp:      TempSrc:      SpO2: 94% 93% 94% 93%  Weight:      Height:        Intake/Output Summary (Last 24 hours) at 04/08/2018 1543 Last data filed at 04/08/2018 1315 Gross per 24 hour  Intake 2118 ml  Output 600 ml  Net 1518 ml   Filed Weights    04/08/18 0614  Weight: 292 lb (132.5 kg)   Body mass index is 38.52 kg/m.  General:   Elderly man,   Sleepy after back surgery  HEENT: normal Lymph: no adenopathy Neck: no JVD Endocrine:  No thryomegaly Vascular: No carotid bruits; FA pulses 2+ bilaterally without bruits  Cardiac:  normal S1, S2; RRR;  Soft systolic murmur  Lungs:  clear to auscultation bilaterally, no wheezing, rhonchi or rales  Abd: soft, nontender, no hepatomegaly  Ext: 2 + edema ,   Chronic stasis changes,  Left lower leg has a bandage  Musculoskeletal:   S/p back surgery  Skin: warm and dry  Neuro:  CNs 2-12 intact, no focal abnormalities noted, sleep  Psych:  Normal affect   EKG:  The EKG was personally reviewed and demonstrates:   NSR , RBBB   Telemetry:   NSR   Relevant CV Studies:  Laboratory Data:  ChemistryNo results for input(s): NA, K, CL, CO2, GLUCOSE, BUN, CREATININE, CALCIUM, GFRNONAA, GFRAA, ANIONGAP in the last 168 hours.  No results for input(s): PROT, ALBUMIN, AST, ALT, ALKPHOS, BILITOT in the last 168 hours. HematologyNo results for input(s): WBC, RBC, HGB, HCT, MCV, MCH, MCHC, RDW, PLT in the last 168 hours. Cardiac EnzymesNo results for input(s): TROPONINI in the last 168 hours. No results for input(s): TROPIPOC in the last 168 hours.  BNPNo results for input(s): BNP, PROBNP in the last 168 hours.  DDimer No results for input(s): DDIMER in the last 168 hours.  Radiology/Studies:  Dg Lumbar Spine 2-3 Views  Result Date: 04/08/2018 CLINICAL DATA:  Status post foraminotomies EXAM: LUMBAR SPINE - 2-3 VIEW COMPARISON:  Lumbar CT November 12, 2017 FLUOROSCOPY TIME:  1 minutes 15 seconds; 2 acquired images FINDINGS: Frontal and lateral views obtained. There is posterior screw and plate fixation at L4 and L5 bilaterally with screw tips in the respective vertebral bodies. No fracture spondylolisthesis. There is marked disc space narrowing at L2-3 and L3-4 with moderate disc space narrowing at L4-5.  IMPRESSION: Postoperative changes at L4 and L5 with screw tips in the respective vertebral bodies. Multilevel osteoarthritic change. No fracture or spondylolisthesis. Electronically Signed   By: Lowella Grip III M.D.   On: 04/08/2018 12:07    Assessment and Plan:   1. Acute on chronic diastolic congestive heart failure: Seems to be stable.  He has chronic diastolic congestive heart failure.  His leg edema seems to be gradually worsening.  It is possible that he may not have been able to elevate his legs much recently because of his back his issues.  His BP is a bit soft - may improved after he wakes up from his surgery .   He would benefit from leg elevation but will have to see if positioning his legs up will be ok with Dr. Ronnald Ramp.  Would try his home dose of torsemide ( 20 mg a day) .   If he does not diurese much, we could give him a dose of IV lasix tomorrow.  He is not in any distress at this point so I can wait and see if he responds well to the torsemide   Has not been as compliant as we would like with his diet.   2.  Essential HTN:   Stable   3. Obstructive sleep apnea:   Has CPAP    For questions or updates, please contact [place] Please consult [email or web address] for contact info under Cardiology/STEMI.   Signed, Mertie Moores, MD  04/08/2018 3:43 PM

## 2018-04-08 NOTE — Progress Notes (Signed)
Patient ID: Travis [name], male   DOB: June 11, 1946, 72 y.o.   MRN: [id number] Doing great post-op, not much pain, legs feel and move better

## 2018-04-08 NOTE — Transfer of Care (Signed)
Immediate Anesthesia Transfer of Care Note  Patient: Travis [name]  Procedure(s) Performed: Laminectomy and Foraminotomy - Lumbar Four-Lumbar Five, instrumented posterolateral fusion Lumbar Four- Five, Laminectomy and Foraminotomy - Thoracic One-Thoracic Two ([name] Spine Lumbar) Laminectomy and Foraminotomy - Lumbar Four-Lumbar Five, instrumented posterolateral fusion Lumbar Four- Five, Laminectomy and Foraminotomy - Thoracic One-Thoracic Two (Thoracic)  Patient Location: PACU  Anesthesia Type:General  Level of Consciousness: sedated  Airway & Oxygen Therapy: Patient Spontanous Breathing and Patient connected to face mask oxygen  Post-op Assessment: Report given to RN, Post -op Vital signs reviewed and stable and Patient moving all extremities X 4  Post vital signs: Reviewed and stable  Last Vitals:  Vitals Value Taken Time  BP 90/46 04/08/2018  1:43 PM  Temp    Pulse 82 04/08/2018  1:45 PM  Resp 13 04/08/2018  1:45 PM  SpO2 97 % 04/08/2018  1:45 PM  Vitals shown include unvalidated device data.  Last Pain:  Vitals:   04/08/18 0623  TempSrc: Oral  PainSc:          Complications: No apparent anesthesia complications

## 2018-04-08 NOTE — H&P (Signed)
Subjective: Patient is a 72 y.o. male admitted for back pain and gait disturbance. Onset of symptoms was a few months ago, gradually worsening since that time.  The pain is rated severe, and is located at the across the lower back. Notes a change in gait. The pain is described as aching and occurs all day. The symptoms have been progressive. Symptoms are exacerbated by nothing in particular. MRI or CT showed stenosis L4-5, T1--2, multiple previous surgeries.   Past Medical History:  Diagnosis Date  . Cervical myelopathy (HCC)    with myelomalacia at C5-6 and C3-4  . CHF (congestive heart failure) ([place])   . Constipation - functional    controlled with miralax  . COPD (chronic obstructive pulmonary disease) (HCC)    mild  . DJD (degenerative joint disease)    WHOLE SPINE  . Dyspnea   . Gait disorder   . Heart murmur    years ago   . Hypertension   . Hypothyroidism    Low d/t treatment of hyperthyroidism  . 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 Peripheral neuropathy   . Pneumonia   . PONV (postoperative nausea and vomiting)   . RBBB   . Renal disorder    acute kidney failure  . Restless legs syndrome 03/14/2015  . RLS (restless legs syndrome)   . Sleep apnea    DX  2009/2010 [facility]  . 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 Type II diabetes mellitus ([place])    diet controlled    Past Surgical History:  Procedure Laterality Date  . BACK SURGERY     6 surgeries  . CARDIAC CATHETERIZATION [name] 09/07/2015   Procedure: Right/Left Heart Cath and Coronary Angiography;  Surgeon: Peter M Martinique, MD;  Location: [facility];  Service: Cardiovascular;  Laterality: [name];  . CHOLECYSTECTOMY    . EYE SURGERY     BIL CATARACTS  . LUMBAR LAMINECTOMY/DECOMPRESSION MICRODISCECTOMY  07/14/2012   Procedure: LUMBAR LAMINECTOMY/DECOMPRESSION MICRODISCECTOMY 2 LEVELS;  Surgeon: Eustace Moore, MD;  Location: [facility];  Service: Neurosurgery;  Laterality: Left;  Lumbar two three  laminectomy, left thoracic four five transpedicular diskectomy    Prior to Admission medications   Medication Sig Start Date End Date Taking? Authorizing Provider  aspirin EC 81 MG EC tablet Take 1 tablet (81 mg total) by mouth daily. 08/23/15  Yes Thurnell Lose, MD  baclofen (LIORESAL) 10 MG tablet Take 1 tablet (10 mg total) by mouth 3 (three) times daily. For muscle spasms 10/20/17  Yes Angiulli, Lavon Paganini, [name]  escitalopram (LEXAPRO) 10 MG tablet Take 1 tablet (10 mg total) by mouth daily. 10/20/17  Yes Angiulli, Lavon Paganini, [name]  gabapentin (NEURONTIN) 400 MG capsule TAKE 1 CAPSULE (400 MG TOTAL) BY MOUTH EVERY 8 (EIGHT) HOURS. 10/20/17  Yes Angiulli, Lavon Paganini, [name]  glimepiride (AMARYL) 1 MG tablet Take 1 tablet (1 mg total) by mouth daily with breakfast. 10/20/17  Yes Angiulli, Lavon Paganini, [name]  hydrALAZINE (APRESOLINE) 50 MG tablet Take 1 tablet (50 mg total) by mouth every 8 (eight) hours. 10/20/17  Yes Angiulli, Lavon Paganini, [name]  levothyroxine (SYNTHROID, LEVOTHROID) 175 MCG tablet Take 1 tablet (175 mcg total) by mouth daily. 10/20/17  Yes Angiulli, Lavon Paganini, [name]  losartan (COZAAR) 50 MG tablet Take 50 mg by mouth daily.   Yes [provider]  polyethylene glycol (MIRALAX / GLYCOLAX) packet Take 17 g by mouth daily as needed. For constipation 08/09/14  Yes Love, Ivan Anchors, [name]  potassium chloride (KLOR-CON 10) 10 MEQ tablet Take 1 tablet (10 mEq total) by mouth 2 (two) times daily. 10/20/17  Yes Angiulli, Lavon Paganini, [name]  pramipexole (MIRAPEX) 0.5 MG tablet Take 1 tablet (0.5 mg total) by mouth 2 (two) times daily. 10/20/17  Yes Angiulli, Lavon Paganini, [name]  tamsulosin (FLOMAX) 0.4 MG CAPS capsule Take 1 capsule (0.4 mg total) by mouth daily. 10/20/17  Yes Angiulli, Lavon Paganini, [name]  torsemide (DEMADEX) 20 MG tablet 1 tablet twice daily and 1 extra tablet daily as needed for swelling 12/21/17  Yes Nahser, Wonda Cheng, MD  TOVIAZ 8 MG TB24 tablet Take 1 tablet (8 mg total) by mouth daily.  10/20/17  Yes Angiulli, Lavon Paganini, [name]  ciprofloxacin (CIPRO) 500 MG tablet 500 mg 2 (two) times daily. X 10 days 03/08/18   [provider]   Allergies  Allergen Reactions  . Other Other (See Comments)    ANESTHESIA UNSPECIFIED  -- makes sick, must have antinausea medication in advance.  . Levofloxacin In D5w Itching    Possible infusion reaction 03/29/17. Tolerating oral Levaquin 03/30/17    Social History   Tobacco Use  . Smoking status: Former Smoker    Packs/day: 2.00    Years: 20.00    Pack years: 40.00    Types: Cigarettes    Last attempt to quit: 06/28/1985    Years since quitting: 32.8  . Smokeless tobacco: Former Systems developer    Quit date: 06/28/1985  Substance Use Topics  . Alcohol use: No    Family History  Problem Relation Age of Onset  . Polycythemia Mother   . Diabetes Father   . Hypertension Father   . Heart disease Father   . CAD Unknown        1 of his 4 brothers and father has CAD     Review of Systems  Positive ROS: neg  All other systems have been reviewed and were otherwise negative with the exception of those mentioned in the HPI and as above.  Objective: Vital signs in last 24 hours: Temp:  [97.6 F (36.4 C)] 97.6 F (36.4 C) (04/11 0623) Pulse Rate:  [70] 70 (04/11 0623) Resp:  [18] 18 (04/11 0623) BP: (157)/(62) 157/62 (04/11 0623) SpO2:  [92 %] 92 % (04/11 0623) Weight:  [132.5 kg (292 lb)] 132.5 kg (292 lb) (04/11 7867)  General Appearance: Alert, cooperative, no distress, appears stated age Head: Normocephalic, without obvious abnormality, atraumatic Eyes: PERRL, conjunctiva/corneas clear, EOM's intact    Neck: Supple, symmetrical, trachea midline Back: Symmetric, no curvature, ROM normal, no CVA tenderness Lungs:  respirations unlabored Heart: Regular rate and rhythm Abdomen: Soft, non-tender Extremities: Extremities normal, atraumatic, no cyanosis or edema Pulses: 2+ and symmetric all extremities Skin: Skin color, texture, turgor  normal, no rashes or lesions  NEUROLOGIC:   Mental status: Alert and oriented x4,  no aphasia, good attention span, fund of knowledge, and memory Motor Exam - grossly normal Sensory Exam - grossly normal Reflexes: 3+ Coordination - grossly normal Gait - spastic Balance - not tested Cranial Nerves: I: smell Not tested  II: visual acuity  OS: nl    OD: nl  II: visual fields Full to confrontation  II: pupils Equal, round, reactive to light  III,VII: ptosis None  III,IV,VI: extraocular muscles  Full ROM  V: mastication Normal  V: facial light touch sensation  Normal  V,VII: corneal reflex  Present  VII: facial muscle function - upper  Normal  VII: facial muscle function - lower Normal  VIII: hearing Not tested  IX: soft palate elevation  Normal  IX,X: gag reflex Present  XI: trapezius strength  5/5  XI: sternocleidomastoid strength 5/5  XI: neck flexion strength  5/5  XII: tongue strength  Normal    Data Review Lab Results  Component Value Date   WBC 5.5 04/01/2018   HGB 12.3 (L) 04/01/2018   HCT 40.0 04/01/2018   MCV 94.3 04/01/2018   PLT 168 04/01/2018   Lab Results  Component Value Date   NA 142 04/01/2018   K 4.1 04/01/2018   CL 104 04/01/2018   CO2 27 04/01/2018   BUN 29 (H) 04/01/2018   CREATININE 1.32 (H) 04/01/2018   GLUCOSE 167 (H) 04/01/2018   Lab Results  Component Value Date   INR 1.07 03/02/2018    Assessment/Plan:  Estimated body mass index is 38.52 kg/m as calculated from the following:   Height as of this encounter: 6\' 1"  (1.854 m).   Weight as of this encounter: 132.5 kg (292 lb). Patient admitted for T1-2 laminectomy and DLL with fusion L4-5. Patient has failed a reasonable attempt at conservative therapy.  I explained the condition and procedure to the patient and answered any questions.  Patient wishes to proceed with procedure as planned. Understands risks/ benefits and typical outcomes of procedure.   Smt. Loder S 04/08/2018 6:32  AM

## 2018-04-08 NOTE — Progress Notes (Addendum)
o2 sat on ra 84-87% o2 applied at 1 liter sat 90-93% Dr Smith Robert informed

## 2018-04-09 ENCOUNTER — Inpatient Hospital Stay (HOSPITAL_COMMUNITY): Payer: Medicare Other

## 2018-04-09 DIAGNOSIS — D62 Acute posthemorrhagic anemia: Secondary | ICD-10-CM

## 2018-04-09 DIAGNOSIS — I5033 Acute on chronic diastolic (congestive) heart failure: Secondary | ICD-10-CM

## 2018-04-09 DIAGNOSIS — E1169 Type 2 diabetes mellitus with other specified complication: Secondary | ICD-10-CM

## 2018-04-09 DIAGNOSIS — G8918 Other acute postprocedural pain: Secondary | ICD-10-CM

## 2018-04-09 DIAGNOSIS — Z981 Arthrodesis status: Secondary | ICD-10-CM

## 2018-04-09 DIAGNOSIS — E669 Obesity, unspecified: Secondary | ICD-10-CM

## 2018-04-09 DIAGNOSIS — J449 Chronic obstructive pulmonary disease, unspecified: Secondary | ICD-10-CM

## 2018-04-09 DIAGNOSIS — M5030 Other cervical disc degeneration, unspecified cervical region: Secondary | ICD-10-CM

## 2018-04-09 LAB — BLOOD GAS, ARTERIAL
ACID-BASE EXCESS: 4 mmol/L — AB (ref 0.0–2.0)
Acid-Base Excess: 4.1 mmol/L — ABNORMAL HIGH (ref 0.0–2.0)
Acid-Base Excess: 4.1 mmol/L — ABNORMAL HIGH (ref 0.0–2.0)
BICARBONATE: 30.6 mmol/L — AB (ref 20.0–28.0)
Bicarbonate: 30.7 mmol/L — ABNORMAL HIGH (ref 20.0–28.0)
Bicarbonate: 30.6 mmol/L — ABNORMAL HIGH (ref 20.0–28.0)
DRAWN BY: 28340
Delivery systems: POSITIVE
Drawn by: 521601
Drawn by: 520751
EXPIRATORY PAP: 6
Expiratory PAP: 8
FIO2: 28
FIO2: 50
FIO2: 0.5
INSPIRATORY PAP: 18
Inspiratory PAP: 12
O2 CONTENT: 2.5 L/min
O2 SAT: 95.6 %
O2 Saturation: 89.1 %
O2 Saturation: 94.1 %
PATIENT TEMPERATURE: 98.6
PATIENT TEMPERATURE: 98.6
PCO2 ART: 71.1 mmHg — AB (ref 32.0–48.0)
PCO2 ART: 70.2 mmHg — AB (ref 32.0–48.0)
PCO2 ART: 69.3 mmHg — AB (ref 32.0–48.0)
PH ART: 7.259 — AB (ref 7.350–7.450)
PH ART: 7.262 — AB (ref 7.350–7.450)
PO2 ART: 61.7 mmHg — AB (ref 83.0–108.0)
PO2 ART: 85.6 mmHg (ref 83.0–108.0)
Patient temperature: 98.6
RATE: 15 resp/min
pH, Arterial: 7.267 — ABNORMAL LOW (ref 7.350–7.450)
pO2, Arterial: 77.3 mmHg — ABNORMAL LOW (ref 83.0–108.0)

## 2018-04-09 LAB — CBC
HCT: 34.2 % — ABNORMAL LOW (ref 39.0–52.0)
Hemoglobin: 10.2 g/dL — ABNORMAL LOW (ref 13.0–17.0)
MCH: 28.7 pg (ref 26.0–34.0)
MCHC: 29.8 g/dL — ABNORMAL LOW (ref 30.0–36.0)
MCV: 96.3 fL (ref 78.0–100.0)
PLATELETS: 125 10*3/uL — AB (ref 150–400)
RBC: 3.55 MIL/uL — ABNORMAL LOW (ref 4.22–5.81)
RDW: 17.1 % — AB (ref 11.5–15.5)
WBC: 8.7 10*3/uL (ref 4.0–10.5)

## 2018-04-09 LAB — BASIC METABOLIC PANEL
Anion gap: 9 (ref 5–15)
BUN: 46 mg/dL — AB (ref 6–20)
CHLORIDE: 101 mmol/L (ref 101–111)
CO2: 30 mmol/L (ref 22–32)
CREATININE: 2.64 mg/dL — AB (ref 0.61–1.24)
Calcium: 8.2 mg/dL — ABNORMAL LOW (ref 8.9–10.3)
GFR calc Af Amer: 26 mL/min — ABNORMAL LOW (ref >60)
GFR calc non Af Amer: 22 mL/min — ABNORMAL LOW (ref >60)
Glucose, Bld: 216 mg/dL — ABNORMAL HIGH (ref 65–99)
Potassium: 4.5 mmol/L (ref 3.5–5.1)
Sodium: 140 mmol/L (ref 135–145)

## 2018-04-09 LAB — PROCALCITONIN: Procalcitonin: <0.1 ng/mL

## 2018-04-09 LAB — GLUCOSE, CAPILLARY
GLUCOSE-CAPILLARY: 209 mg/dL — AB (ref 65–99)
GLUCOSE-CAPILLARY: 152 mg/dL — AB (ref 65–99)
GLUCOSE-CAPILLARY: 162 mg/dL — AB (ref 65–99)
Glucose-Capillary: 187 mg/dL — ABNORMAL HIGH (ref 65–99)

## 2018-04-09 MED ORDER — FUROSEMIDE 10 MG/ML IJ SOLN: 40.0000 mg | Freq: Two times a day (BID) | INTRAMUSCULAR | Status: DC

## 2018-04-09 MED ORDER — FUROSEMIDE 10 MG/ML IJ SOLN
40.0000 mg | Freq: Once | INTRAMUSCULAR | Status: AC
  Administered 2018-04-09: 40 mg via INTRAVENOUS
  Filled 2018-04-09: qty 4

## 2018-04-09 MED ORDER — FUROSEMIDE 10 MG/ML IJ SOLN
80.0000 mg | Freq: Once | INTRAMUSCULAR | Status: AC
  Administered 2018-04-09: 80 mg via INTRAVENOUS
  Filled 2018-04-09: qty 8

## 2018-04-09 MED ORDER — BACLOFEN 10 MG PO TABS: 5.0000 mg | ORAL_TABLET | Freq: Three times a day (TID) | ORAL | Status: DC

## 2018-04-09 MED ORDER — LACTATED RINGERS IV SOLN
INTRAVENOUS | Status: DC
  Administered 2018-04-09 – 2018-04-10 (×2): via INTRAVENOUS

## 2018-04-09 MED FILL — Gelatin Absorbable MT Powder: OROMUCOSAL | Qty: 1 | Status: AC

## 2018-04-09 MED FILL — Thrombin For Soln 20000 Unit: CUTANEOUS | Qty: 1 | Status: AC

## 2018-04-09 MED FILL — Thrombin For Soln 5000 Unit: CUTANEOUS | Qty: 5000 | Status: AC

## 2018-04-09 NOTE — Progress Notes (Signed)
Pt in recliner and placed back in bed dyspnea with exertion and patient unable to assist with positioning.

## 2018-04-09 NOTE — Evaluation (Signed)
Physical Therapy Evaluation Patient Details Name: Travis [name] MRN: [id number] DOB: 01/21/1946 Today's Date: 04/09/2018   History of Present Illness  Pt is a 72 yo male admitted for decompressive lumbar laminectomy with hemifacetectomy and foraminotomy at L4-5 [place].  Pt also had C7-T1 medial facetectomry and foraminotomy.  Pt with PMH of previous lumbar surgeries, COPD, CKD, OSA, CHF, DDD. Pt not on home O2 but on 3L today.    Clinical Impression  Pt presents requiring +2 physical assistance for all mobility and transfers. Pt will benefit from skilled PT services to address deficits and decrease burden of care. Pt's family requesting CIR, pt has supportive family and will benefit from intense rehab to reduce burden of care on family for d/c home.    Follow Up Recommendations CIR    Equipment Recommendations  None recommended by PT    Recommendations for Other Services Rehab consult     Precautions / Restrictions Precautions Precautions: Fall;Back;Cervical Precaution Comments: pt's family familiar with back precautions from previous surgeries.  No brace required Restrictions Weight Bearing Restrictions: No      Mobility  Bed Mobility Overal bed mobility: Needs Assistance Bed Mobility: Rolling;Sidelying to Sit Rolling: Max assist;+2 for physical assistance Sidelying to sit: +2 for physical assistance;Max assist       General bed mobility comments: Pt required cues for hand placement and cues to go slow to avoid getting dizzy.  Transfers Overall transfer level: Needs assistance Equipment used: Rolling walker (2 wheeled) Transfers: Sit to/from [facility] Sit to Stand: Max assist;+2 physical assistance Stand pivot transfers: Max assist;+2 physical assistance       General transfer comment: assist for lifting, for upright posture, pt unable to fully extend hips and knees despite +2 assist. fatigues quickly with spO2 dropping to 80's but quickly returns to  90's with seated rest  Ambulation/Gait                Stairs            Wheelchair Mobility    Modified Rankin (Stroke Patients Only)       Balance Overall balance assessment: Needs assistance Sitting-balance support: Bilateral upper extremity supported;Feet supported Sitting balance-Leahy Scale: Normal   Postural control: Posterior lean Standing balance support: Bilateral upper extremity supported;During functional activity Standing balance-Leahy Scale: Zero Standing balance comment: must have extensive outside assist to stand.                             Pertinent Vitals/Pain Pain Assessment: No/denies pain    Home Living Family/patient expects to be discharged to:: Private residence Living Arrangements: Spouse/significant other Available Help at Discharge: Family;Available 24 hours/day;Personal care attendant Type of Home: House Home Access: Ramped entrance     Home Layout: One level Home Equipment: [place] - 2 wheels;Walker - 4 wheels;Cane - single point;Bedside commode;Wheelchair - manual;Shower seat - built in;Grab bars - tub/shower Additional Comments: pt had caregivers 3x a week for bathing.  pt's wife was able to assist with transfers and toileting. pt mostly w/c level    Prior Function Level of Independence: Needs assistance   Gait / Transfers Assistance Needed: Pt could transfer with assist but has not walked functionally since december  ADL's / Homemaking Assistance Needed: Pt can feed self and assist with some UE adls but has assist with all dressing and LE bathing, toileting.   Comments: uses WC and lift chair at home  Hand Dominance   Dominant Hand: Right    Extremity/Trunk Assessment   Upper Extremity Assessment Upper Extremity Assessment: RUE deficits/detail;LUE deficits/detail RUE Deficits / Details: ROM WFL  Strength 3+/5 shoulders LUE Deficits / Details: ROM WFL  Strength 3+/5 shoulders    Lower Extremity  Assessment Lower Extremity Assessment: RLE deficits/detail;LLE deficits/detail RLE Deficits / Details: strength 2/5 hip, knee and ankle LLE Deficits / Details: strength 2/5 hip knee and ankle    Cervical / Trunk Assessment Cervical / Trunk Assessment: Other exceptions(pt with surgery to neck and back) Cervical / Trunk Exceptions: surgery to neck and back.  Pt has hard time holding head up comfortably due to weakness.  Communication   Communication: Other (comment)(slurred speech today. Pt lethargic)  Cognition Arousal/Alertness: Lethargic Behavior During Therapy: (lethargy ? due to anesthesia) Overall Cognitive Status: Impaired/Different from baseline Area of Impairment: Following commands;Awareness;Problem solving;Attention                   Current Attention Level: Sustained   Following Commands: Follows one step commands consistently   Awareness: Emergent Problem Solving: Slow processing General Comments: feel cognitive deficits may be due to anesthesia      General Comments General comments (skin integrity, edema, etc.): pt on 3LO2 during session, spO2 drops with activity but is able to return to 90's with seated rest    Exercises     Assessment/Plan    PT Assessment Patient needs continued PT services  PT Problem List Decreased strength;Decreased mobility;Decreased activity tolerance;Decreased balance;Decreased knowledge of use of DME;Decreased cognition;Cardiopulmonary status limiting activity;Decreased knowledge of precautions       PT Treatment Interventions DME instruction;Therapeutic activities;Cognitive remediation;Modalities;Gait training;Therapeutic exercise;Patient/family education;Balance training;Wheelchair mobility training;Functional mobility training;Neuromuscular re-education    PT Goals (Current goals can be found in the Care Plan section)  Acute Rehab PT Goals Patient Stated Goal: to walk again PT Goal Formulation: With patient/family Time For  Goal Achievement: 04/23/18 Potential to Achieve Goals: Fair    Frequency Min 5X/week   Barriers to discharge        Co-evaluation PT/OT/SLP Co-Evaluation/Treatment: Yes Reason for Co-Treatment: Complexity of the patient's impairments (multi-system involvement);For patient/therapist safety;To address functional/ADL transfers;Necessary to address cognition/behavior during functional activity PT goals addressed during session: Mobility/safety with mobility OT goals addressed during session: ADL's and self-care       AM-PAC PT "6 Clicks" Daily Activity  Outcome Measure Difficulty turning over in bed (including adjusting bedclothes, sheets and blankets)?: A Lot Difficulty moving from lying on back to sitting on the side of the bed? : A Lot Difficulty sitting down on and standing up from a chair with arms (e.g., wheelchair, bedside commode, etc,.)?: A Lot Help needed moving to and from a bed to chair (including a wheelchair)?: A Lot Help needed walking in hospital room?: Total Help needed climbing 3-5 steps with a railing? : Total 6 Click Score: 10    End of Session Equipment Utilized During Treatment: Oxygen Activity Tolerance: Patient limited by fatigue Patient left: in chair;with family/visitor present;with call bell/phone within reach Nurse Communication: Mobility status;Need for lift equipment(recommend stedy for nursing transfers) PT Visit Diagnosis: Unsteadiness on feet (R26.81);Muscle weakness (generalized) (M62.81);Difficulty in walking, not elsewhere classified (R26.2)    Time: 4944-9675 PT Time Calculation (min) (ACUTE ONLY): 30 min   Charges:   PT Evaluation $PT Eval Moderate Complexity: 1 Mod     PT G Codes:        Isabelle Course, PT, DPT  Quetzal Meany 04/09/2018, 10:47 AM

## 2018-04-09 NOTE — Progress Notes (Signed)
Rehab admissions - I met with patient's wife, daughter and family at the bedside.  Patient was asleep during my visit.  Family would like inpatient rehab admission.  I will contact [employer] insurance carrier and begin precert process.  I gave family rehab booklets.  I will follow up again on Monday.  Call me for questions.  #[phone] 

## 2018-04-09 NOTE — Progress Notes (Signed)
    Was asked by nurse to come and assess Travis [name]. He has had progressive lethargy through the day  Has not put out any urine despite getting Lasix 40 mg IV  Bladder scan shows ~ 150 cc  CXR done - appears to be hypoventilating with pulmonary edema  Plan  Foley catheter - the bladder scan may be falsly low Lasix 80 mg IV now ABG has been drawn and is pending  BMP being drawn   I think he may benefit from BIPAP      Mertie Moores, MD  04/09/2018 3:06 PM    [facility] [address]  [place], [place]  [address] Pager [phone] Phone: [phone]; Fax: [phone]

## 2018-04-09 NOTE — Progress Notes (Signed)
The patient's arterial blood gas demonstrates a respiratory acidosis.  His chest x-ray demonstrates atelectasis without clear pulmonary edema.  I have moved the patient to the ICU for closer monitoring.  I have spoken to Dr. Debbora Dus, critical care.   He has kindly agreed to help [place] with this patient's illness  and respiratory issues.

## 2018-04-09 NOTE — Progress Notes (Signed)
Called by bedside RN to assist with transfer of pt on bipap to ICU.  ABG at 1442 revealed a pH of 7.25, CO2 71, and pO2 of 61  On my arrival, the pt was lethargic but responsive to voice, bipap was in place with FiO2 50%, SpO2 96%.  BP stable at 109/50, HR 67, temp 98.2.  Lung sounds were diminished bilaterally, respiratory rate was controlled.    Family at bedside was updated on the plan of care.  The pt was transferred without incident to 3M08.

## 2018-04-09 NOTE — Progress Notes (Signed)
Bi-pap ordered, foley placed with 100 ml output. Labs drawn and patient being transfer to ICU

## 2018-04-09 NOTE — Progress Notes (Signed)
Subjective: The patient is alert and pleasant.  His back is appropriately sore.  He is in no apparent distress.  Objective: Vital signs in last 24 hours: Temp:  [97.5 F (36.4 C)-99 F (37.2 C)] 99 F (37.2 C) (04/12 0520) Pulse Rate:  [75-86] 76 (04/12 0520) Resp:  [13-21] 18 (04/12 0520) BP: (90-127)/(33-67) 108/58 (04/12 0520) SpO2:  [91 %-97 %] 93 % (04/12 0520) Weight:  [137.9 kg (304 lb 0.2 oz)] 137.9 kg (304 lb 0.2 oz) (04/11 1733) Estimated body mass index is 40.11 kg/m as calculated from the following:   Height as of this encounter: 6\' 1"  (1.854 m).   Weight as of this encounter: 137.9 kg (304 lb 0.2 oz).   Intake/Output from previous day: 04/11 0701 - 04/12 0700 In: 2118 [I.V.:2018; IV Piggyback:100] Out: 600 [Urine:300; Blood:300] Intake/Output this shift: No intake/output data recorded.  Physical exam the patient is alert and pleasant.  His strength is grossly normal in his myelo gastrocnemius and dorsiflexors.  Lab Results: No results for input(s): WBC, HGB, HCT, PLT in the last 72 hours. BMET No results for input(s): NA, K, CL, CO2, GLUCOSE, BUN, CREATININE, CALCIUM in the last 72 hours.  Studies/Results: Dg Lumbar Spine 2-3 Views  Result Date: 04/08/2018 CLINICAL DATA:  Status post foraminotomies EXAM: LUMBAR SPINE - 2-3 VIEW COMPARISON:  Lumbar CT November 12, 2017 FLUOROSCOPY TIME:  1 minutes 15 seconds; 2 acquired images FINDINGS: Frontal and lateral views obtained. There is posterior screw and plate fixation at L4 and L5 bilaterally with screw tips in the respective vertebral bodies. No fracture spondylolisthesis. There is marked disc space narrowing at L2-3 and L3-4 with moderate disc space narrowing at L4-5. IMPRESSION: Postoperative changes at L4 and L5 with screw tips in the respective vertebral bodies. Multilevel osteoarthritic change. No fracture or spondylolisthesis. Electronically Signed   By: Lowella Grip III M.D.   On: 04/08/2018 12:07   Dg  Thoracic Spine 1 View  Result Date: 04/08/2018 CLINICAL DATA:  Thoracic laminectomy EXAM: THORACIC SPINE - 1 VIEW; DG C-ARM 61-120 MIN COMPARISON:  None. FLUOROSCOPY TIME:  Radiation Exposure Index (as provided by the fluoroscopic device): Not available If the device does not provide the exposure index: Fluoroscopy Time:  2 seconds Number of Acquired Images:  1 FINDINGS: Surgical retractors are noted over the T1 and T2 vertebra. Postsurgical changes in the lower cervical spine are seen. IMPRESSION: Thoracic localization intraoperatively. Electronically Signed   By: Inez Catalina M.D.   On: 04/08/2018 16:11   Dg C-arm 1-60 Min  Result Date: 04/08/2018 CLINICAL DATA:  Thoracic laminectomy EXAM: THORACIC SPINE - 1 VIEW; DG C-ARM 61-120 MIN COMPARISON:  None. FLUOROSCOPY TIME:  Radiation Exposure Index (as provided by the fluoroscopic device): Not available If the device does not provide the exposure index: Fluoroscopy Time:  2 seconds Number of Acquired Images:  1 FINDINGS: Surgical retractors are noted over the T1 and T2 vertebra. Postsurgical changes in the lower cervical spine are seen. IMPRESSION: Thoracic localization intraoperatively. Electronically Signed   By: Inez Catalina M.D.   On: 04/08/2018 16:11    Assessment/Plan: Postop day 1: We will mobilize the patient with physical therapy.  It looks like he may need rehab.  I asked the rehab team to see him.  LOS: 1 day     Travis [name] 04/09/2018, 6:36 AM

## 2018-04-09 NOTE — Progress Notes (Addendum)
Progress Note  Patient Name: Travis [name] Date of Encounter: 04/09/2018  Primary Cardiologist:  Mertie Moores, MD  Subjective   Pt w/ hx chronic diastolic congestive heart failure, obstructive sleep apnea, hypertension, diabetes mellitus, mild pulmonary hypertension admitted 04/11 for back surgery.   Not on home O2, has been having increased DOE pta, seems SOB w/ conversation even on O2, but has had pain meds, a little sedated.  Inpatient Medications    Scheduled Meds: . baclofen  10 mg Oral TID  . celecoxib  200 mg Oral Q12H  . escitalopram  10 mg Oral Daily  . fesoterodine  8 mg Oral Daily  . gabapentin  400 mg Oral TID  . glimepiride  1 mg Oral Q breakfast  . hydrALAZINE  50 mg Oral Q8H  . insulin aspart  0-15 Units Subcutaneous TID WC  . levothyroxine  175 mcg Oral QAC breakfast  . losartan  50 mg Oral Daily  . pramipexole  0.5 mg Oral BID  . senna  1 tablet Oral BID  . sodium chloride flush  3 mL Intravenous Q12H  . tamsulosin  0.4 mg Oral Daily  . torsemide  20 mg Oral Daily   Continuous Infusions: . sodium chloride    . 0.9 % NaCl with KCl 20 mEq / L    . methocarbamol (ROBAXIN)  IV     PRN Meds: acetaminophen **OR** acetaminophen, HYDROmorphone (DILAUDID) injection, menthol-cetylpyridinium **OR** phenol, methocarbamol **OR** methocarbamol (ROBAXIN)  IV, ondansetron **OR** ondansetron (ZOFRAN) IV, oxyCODONE, sodium chloride flush   Vital Signs    Vitals:   04/08/18 2048 04/09/18 0039 04/09/18 0520 04/09/18 0855  BP: (!) 127/53 (!) 118/33 (!) 108/58 121/84  Pulse: 81 86 76 77  Resp: 18 18 18 18   Temp: 98 F (36.7 C) 98.1 F (36.7 C) 99 F (37.2 C) 98.3 F (36.8 C)  TempSrc: Oral Oral Oral Oral  SpO2: 91% 92% 93% 93%  Weight:      Height:        Intake/Output Summary (Last 24 hours) at 04/09/2018 1032 Last data filed at 04/08/2018 1315 Gross per 24 hour  Intake 1118 ml  Output 200 ml  Net 918 ml   Filed Weights   04/08/18 0614 04/08/18 1733    Weight: 292 lb (132.5 kg) (!) 304 lb 0.2 oz (137.9 kg)    Telemetry    Not on - Personally Reviewed  ECG    None this admit - Personally Reviewed  Physical Exam   General: Well developed, well nourished, male appearing in no acute distress. Head: Normocephalic, atraumatic.  Neck: Supple without bruits, JVD appears elevated but difficult to assess 2nd body habitus. Lungs:  Resp regular and unlabored, decreased BS bases, few rales. Heart: RRR, S1, S2, no S3, S4, soft murmur; no rub. Abdomen: Soft, non-tender, non-distended with normoactive bowel sounds. No hepatomegaly. No rebound/guarding. No obvious abdominal masses. Extremities: No clubbing, cyanosis, trace-1+ LE edema. Distal pedal pulses are 2+ bilaterally. Neuro: sleepy but oriented X 3. Moves all extremities spontaneously. Psych: Normal affect.  Labs    HematologyNo results for input(s): WBC, RBC, HGB, HCT, MCV, MCH, MCHC, RDW, PLT in the last 168 hours.  ChemistryNo results for input(s): NA, K, CL, CO2, GLUCOSE, BUN, CREATININE, CALCIUM, PROT, ALBUMIN, AST, ALT, ALKPHOS, BILITOT, GFRNONAA, GFRAA, ANIONGAP in the last 168 hours.   Cardiac EnzymesNo results for input(s): TROPONINI in the last 168 hours. No results for input(s): TROPIPOC in the last 168 hours.   BNPNo  results for input(s): BNP, PROBNP in the last 168 hours.   DDimer No results for input(s): DDIMER in the last 168 hours.   Radiology    Dg Lumbar Spine 2-3 Views  Result Date: 04/08/2018 CLINICAL DATA:  Status post foraminotomies EXAM: LUMBAR SPINE - 2-3 VIEW COMPARISON:  Lumbar CT November 12, 2017 FLUOROSCOPY TIME:  1 minutes 15 seconds; 2 acquired images FINDINGS: Frontal and lateral views obtained. There is posterior screw and plate fixation at L4 and L5 bilaterally with screw tips in the respective vertebral bodies. No fracture spondylolisthesis. There is marked disc space narrowing at L2-3 and L3-4 with moderate disc space narrowing at L4-5. IMPRESSION:  Postoperative changes at L4 and L5 with screw tips in the respective vertebral bodies. Multilevel osteoarthritic change. No fracture or spondylolisthesis. Electronically Signed   By: Lowella Grip III M.D.   On: 04/08/2018 12:07   Dg Thoracic Spine 1 View  Result Date: 04/08/2018 CLINICAL DATA:  Thoracic laminectomy EXAM: THORACIC SPINE - 1 VIEW; DG C-ARM 61-120 MIN COMPARISON:  None. FLUOROSCOPY TIME:  Radiation Exposure Index (as provided by the fluoroscopic device): Not available If the device does not provide the exposure index: Fluoroscopy Time:  2 seconds Number of Acquired Images:  1 FINDINGS: Surgical retractors are noted over the T1 and T2 vertebra. Postsurgical changes in the lower cervical spine are seen. IMPRESSION: Thoracic localization intraoperatively. Electronically Signed   By: Inez Catalina M.D.   On: 04/08/2018 16:11   Dg C-arm 1-60 Min  Result Date: 04/08/2018 CLINICAL DATA:  Thoracic laminectomy EXAM: THORACIC SPINE - 1 VIEW; DG C-ARM 61-120 MIN COMPARISON:  None. FLUOROSCOPY TIME:  Radiation Exposure Index (as provided by the fluoroscopic device): Not available If the device does not provide the exposure index: Fluoroscopy Time:  2 seconds Number of Acquired Images:  1 FINDINGS: Surgical retractors are noted over the T1 and T2 vertebra. Postsurgical changes in the lower cervical spine are seen. IMPRESSION: Thoracic localization intraoperatively. Electronically Signed   By: Inez Catalina M.D.   On: 04/08/2018 16:11     Cardiac Studies   ECHO:  02/2017 - Left ventricle: The cavity size was normal. Wall thickness was   increased in a pattern of mild LVH. Systolic function was   vigorous. The estimated ejection fraction was in the range of 65%   to 70%. Wall motion was normal; there were no regional wall   motion abnormalities. Doppler parameters are consistent with   pseudonormal left ventricular relaxation (grade 2 diastolic   dysfunction). The E/A ratio is >1.5. The E/e&'  ratio is >15,   suggesting elevated LV filling pressure. - Left atrium: The atrium was mildly dilated. - Inferior vena cava: The vessel was normal in size. The   respirophasic diameter changes were in the normal range (>= 50%),   consistent with normal central venous pressure. Impressions: - Compared to a prior study in 2016, there is no significant   change. LV filling pressure is elevated.  Patient Profile     72 y.o. male w/ hx chronic diastolic congestive heart failure, obstructive sleep apnea, hypertension, diabetes mellitus, mild pulmonary hypertension was admitted 04/11 for back surgery, afterwards had D-CHF exacerbation>>cards saw.   Assessment & Plan     Principal Problem: 1.  S/P lumbar spinal fusion - per Dr Arnoldo Morale and NS team - CIR recommended  Active Problems: 2.  Acute on chronic diastolic heart failure (HCC) - wt generally about 290 lbs - 302 today, may not be accurate,  I/O +1.5 L, also may not be accurate - However, seems SOB w/ minimal activity and some volume overload on exam. - will give 1 dose IV Lasix 40 mg, follow BMET, I/O, and weights - once volume at baseline, ck O2 sats w/ ambulation, may qualify for home O2  3.  OSA on CPAP - ordered, RN to make sure he gets tonight, did not have last pm  4.  Stage 3 chronic kidney disease ([place]) - follow BMET w/ diuresis   Signed, Rosaria Ferries , [name] 10:32 AM 04/09/2018 Pager: [phone]  Attending Note:   The patient was seen and examined.  Agree with assessment and plan as noted above.  Changes made to the above note as needed.  Patient seen and independently examined with Rosaria Ferries, PA .   We discussed all aspects of the encounter. I agree with the assessment and plan as stated above.  1.   Acute on chronic diastolic CHF Wt is up . Will give him lasix 40 mg IV .  he may need several days of IV lasix to mobilize his edema  He is net + 1.5 liters from surgery  Has 2+ leg edema   Would  ideally have his leg higher than his heart.   Will let him heal for several days before we attempt significant leg elevation    I have spent a total of 40 minutes with patient reviewing hospital  notes , telemetry, EKGs, labs and examining patient as well as establishing an assessment and plan that was discussed with the patient. > 50% of time was spent in direct patient care.     [name], [name], MD, [facility] 04/09/2018, 11:46 AM [address],  [place] Pager [phone]

## 2018-04-09 NOTE — Evaluation (Signed)
Occupational Therapy Evaluation Patient Details Name: Travis [name] MRN: [id number] DOB: 1946-02-27 Today's Date: 04/09/2018    History of Present Illness Pt is a 72 yo male admitted for decompressive lumbar laminectomy with hemifacetectomy and foraminotomy at L4-5 [place].  Pt also had C7-T1 medial facetectomry and foraminotomy.  Pt with PMH of previous lumbar surgeries, COPD, CKD, OSA, CHF, DDD. Pt not on home O2 but on 3L today.     Clinical Impression   Pt admitted with the above diagnosis and long PMH and has the deficits listed below. Pt would benefit from extensive OT and in rehab setting to increase independence with basic adls and adl transfers so pt can return to previous level of functioning. Pt has assist from caregivers and an aid that comes 3x week.  Feel it is possible to get pt back to baseline or better so he can transfer on his own and have more independence with toileting, bathing and dressing.  Pt has very supportive family in home with a ramp.    Follow Up Recommendations  CIR;Supervision/Assistance - 24 hour    Equipment Recommendations  Wheelchair (measurements OT);Wheelchair cushion (measurements OT);Other (comment)(needs new w/c)    Recommendations for Other Services Rehab consult     Precautions / Restrictions Precautions Precautions: Fall;Back;Cervical Precaution Comments: pt's family familiar with back precautions from previous surgeries. Restrictions Weight Bearing Restrictions: No      Mobility Bed Mobility Overal bed mobility: Needs Assistance Bed Mobility: Rolling;Sidelying to Sit Rolling: Max assist;+2 for physical assistance Sidelying to sit: +2 for physical assistance;Max assist       General bed mobility comments: Pt required cues for hand placement and cues to go slow to avoid getting dizzy.  Transfers Overall transfer level: Needs assistance Equipment used: Rolling walker (2 wheeled) Transfers: Sit to/from [employer] Sit to Stand: Max assist;+2 physical assistance Stand pivot transfers: Max assist;+2 physical assistance       General transfer comment: Recommend using STEDY for transfers.  Pt very weak but did come to standing with +2 assist.  Pt fatigues quickly.    Balance Overall balance assessment: Needs assistance Sitting-balance support: Bilateral upper extremity supported;Feet supported Sitting balance-Leahy Scale: Poor   Postural control: Posterior lean Standing balance support: Bilateral upper extremity supported;During functional activity Standing balance-Leahy Scale: Zero Standing balance comment: must have extensive outside assist to stand.                           ADL either performed or assessed with clinical judgement   ADL Overall ADL's : Needs assistance/impaired Eating/Feeding: Minimal assistance;Sitting;Set up Eating/Feeding Details (indicate cue type and reason): pt fatigues quickly.  Needs lid on drink with straw Grooming: Oral care;Wash/dry face;Wash/dry hands;Minimal assistance;Sitting;Set up Grooming Details (indicate cue type and reason): assist reaching overhead to comb hair Upper Body Bathing: Moderate assistance;Sitting Upper Body Bathing Details (indicate cue type and reason): pt requires supported sitting due to poor balance. Lower Body Bathing: Total assistance;+2 for physical assistance;Sit to/from stand;Cueing for safety;Cueing for compensatory techniques Lower Body Bathing Details (indicate cue type and reason): Pt unable to dress LE at this time due to fatigue, poor balance and inability to reach LEs. Pt may need adaptive equipment at some point. Upper Body Dressing : Total assistance;Sitting   Lower Body Dressing: Total assistance;+2 for physical assistance;Sit to/from stand   Toilet Transfer: +2 for physical assistance;Maximal assistance;Squat-pivot;BSC;RW Toilet Transfer Details (indicate cue type and reason): BSC with stand pivot  with  walker +2. Pt would benefit from steady. Toileting- Clothing Manipulation and Hygiene: Total assistance;+2 for physical assistance;Bed level       Functional mobility during ADLs: Maximal assistance;+2 for physical assistance General ADL Comments: Pt fairly dependent with adls outside of feeding and grooming at this point.  Pt very lethargic.       Vision   Vision Assessment?: (needs to be assessed)     Perception Perception Perception Tested?: No   Praxis Praxis Praxis tested?: Within functional limits    Pertinent Vitals/Pain Pain Assessment: No/denies pain     Hand Dominance Right   Extremity/Trunk Assessment Upper Extremity Assessment Upper Extremity Assessment: RUE deficits/detail;LUE deficits/detail RUE Deficits / Details: ROM WFL  Strength 3+/5 shoulders LUE Deficits / Details: ROM WFL  Strength 3+/5 shoulders   Lower Extremity Assessment Lower Extremity Assessment: Defer to PT evaluation   Cervical / Trunk Assessment Cervical / Trunk Assessment: Other exceptions(pt with surgery to neck and back) Cervical / Trunk Exceptions: surgery to neck and back.  Pt has hard time holding head up comfortably due to weakness.   Communication Communication Communication: Other (comment)(slurred speech today. Pt lethargic)   Cognition Arousal/Alertness: Lethargic Behavior During Therapy: (lethargic) Overall Cognitive Status: Impaired/Different from baseline Area of Impairment: Following commands;Awareness;Problem solving;Attention                   Current Attention Level: Sustained   Following Commands: Follows one step commands consistently   Awareness: Emergent Problem Solving: Slow processing General Comments: feel cognitive deficits may be due to medication   General Comments  Pt on 3L O2 and dropped into 80s on 3L during mobility but recovered into 90s quickly.    Exercises     Shoulder Instructions      Home Living Family/patient expects to be  discharged to:: Private residence Living Arrangements: Spouse/significant other Available Help at Discharge: Family;Available 24 hours/day Type of Home: House Home Access: Ramped entrance     Home Layout: One level     Bathroom Shower/Tub: Occupational psychologist: Handicapped height Bathroom Accessibility: Yes How Accessible: Accessible via wheelchair Home Equipment: [place] - 2 wheels;Walker - 4 wheels;Cane - single point;Bedside commode;Wheelchair - manual;Shower seat - built in;Grab bars - tub/shower   Additional Comments: Pt requires +2 max to transfer.  Strongly encouraged family to let staff move pt.      Prior Functioning/Environment Level of Independence: Needs assistance  Gait / Transfers Assistance Needed: Pt could transfer with assist but has not walked functionally since december ADL's / Homemaking Assistance Needed: Pt can feed self and assist with some UE adls but has assist with all dressing and LE bathing, toileting.    Comments: uses WC and lift chair at home        OT Problem List: Decreased strength;Decreased activity tolerance;Impaired balance (sitting and/or standing);Decreased knowledge of precautions;Decreased knowledge of use of DME or AE;Cardiopulmonary status limiting activity;Impaired UE functional use;Increased edema      OT Treatment/Interventions: Self-care/ADL training;Therapeutic exercise;Neuromuscular education;Energy conservation;DME and/or AE instruction;Therapeutic activities;Patient/family education;Balance training    OT Goals(Current goals can be found in the care plan section) Acute Rehab OT Goals Patient Stated Goal: to walk again OT Goal Formulation: With patient/family Time For Goal Achievement: 04/23/18 Potential to Achieve Goals: Good ADL Goals Pt Will Perform Eating: with set-up;sitting Pt Will Perform Grooming: with set-up;sitting Pt Will Perform Upper Body Bathing: with set-up;sitting Pt Will Perform Upper Body  Dressing: with min assist;sitting Pt Will Transfer  to Toilet: with mod assist;stand pivot transfer;bedside commode  OT Frequency: Min 3X/week   Barriers to D/C:    Pt with very supportive family       Co-evaluation PT/OT/SLP Co-Evaluation/Treatment: Yes Reason for Co-Treatment: Complexity of the patient's impairments (multi-system involvement);Necessary to address cognition/behavior during functional activity;For patient/therapist safety;To address functional/ADL transfers PT goals addressed during session: Mobility/safety with mobility OT goals addressed during session: ADL's and self-care      AM-PAC PT "6 Clicks" Daily Activity     Outcome Measure Help from another person eating meals?: A Little Help from another person taking care of personal grooming?: A Lot Help from another person toileting, which includes using toliet, bedpan, or urinal?: A Lot Help from another person bathing (including washing, rinsing, drying)?: A Lot Help from another person to put on and taking off regular upper body clothing?: Total Help from another person to put on and taking off regular lower body clothing?: Total 6 Click Score: 11   End of Session Equipment Utilized During Treatment: Rolling walker;Oxygen Nurse Communication: Mobility status;Need for lift equipment;Precautions  Activity Tolerance: Patient limited by lethargy Patient left: in chair;with call bell/phone within reach;with family/visitor present  OT Visit Diagnosis: Unsteadiness on feet (R26.81);Other abnormalities of gait and mobility (R26.89);Muscle weakness (generalized) (M62.81);Other symptoms and signs involving the nervous system (R29.898)                Time: 8307-4600 OT Time Calculation (min): 42 min Charges:  OT General Charges $OT Visit: 1 Visit OT Evaluation $OT Eval Moderate Complexity: 1 Mod OT Treatments $Self Care/Home Management : 8-22 mins G-Codes:     Jinger Neighbors, OTR/L [phone]  Glenford Peers 04/09/2018, 10:01 AM

## 2018-04-09 NOTE — Consult Note (Signed)
Physical Medicine and Rehabilitation Consult   Reason for Consult: Lumbar and Cervical myelopathy with tetraplegia. Referring Physician: Dr. Ronnald Ramp.    HPI: Travis [name] is a 72 y.o. 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  History taken from chart review and patient. He started declining few months ago with paraparesis, inability to walk or perform ADLs due to severe recurrent lumbar stenosis L4/5 and thoracic spinal stenosis T1-T2. He was admitted on 04/08/18 for decompressive lumbar lami with arthrodesis L4/5 and partial C7 and full T1 laminectomy with medial facetectomy and foraminotomy by Dr. Ronnald Ramp. Post op is already showing some mprovement in BLE strength with ability to activate quads as well as PF/DF. Therapy evaluations to be done today. CIR recommended in anticipation of extensive rehab needs.  Patient was independent and walking household distances until Dec 2018. He started declining early this year with bladder incontinence and became wheelchair bound. He could perform stand pivot transfers with help. Able to feed self but needed assistance with all other tasks.  Son moved in with them to assist and he has an aide 3X a week to help with B/D. Family supportive and can assist after discharge.     Review of Systems  Constitutional: Positive for malaise/fatigue.  Respiratory: Positive for shortness of breath.   Cardiovascular: Positive for leg swelling.  Musculoskeletal: Positive for back pain.  All other systems reviewed and are negative.   Past Medical History:  Diagnosis Date  . Cervical myelopathy (HCC)    with myelomalacia at C5-6 and C3-4  . CHF (congestive heart failure) ([place])   . Constipation - functional    controlled with miralax  . COPD (chronic obstructive pulmonary disease) (HCC)      mild  . DJD (degenerative joint disease)    WHOLE SPINE  . Dyspnea   . Gait disorder   . Heart murmur    years ago   . Hypertension   . Hypothyroidism    Low d/t treatment of hyperthyroidism  . 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 Peripheral neuropathy   . Pneumonia   . PONV (postoperative nausea and vomiting)   . RBBB   . Renal disorder    acute kidney failure  . Restless legs syndrome 03/14/2015  . RLS (restless legs syndrome)   . Sleep apnea    DX  2009/2010 study done at [facility]  . 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 Type II diabetes mellitus ([place])    diet controlled    Past Surgical History:  Procedure Laterality Date  . BACK SURGERY     6 surgeries  . CARDIAC CATHETERIZATION [name] 09/07/2015   Procedure: Right/Left Heart Cath and Coronary Angiography;  Surgeon: Peter M Martinique, MD;  Location: [facility];  Service: Cardiovascular;  Laterality: [name];  . CHOLECYSTECTOMY    . EYE SURGERY     BIL CATARACTS  . LUMBAR LAMINECTOMY/DECOMPRESSION MICRODISCECTOMY  07/14/2012   Procedure: LUMBAR LAMINECTOMY/DECOMPRESSION MICRODISCECTOMY 2 LEVELS;  Surgeon: Eustace Moore, MD;  Location: [facility];  Service: Neurosurgery;  Laterality: Left;  Lumbar two three laminectomy, left thoracic four five transpedicular diskectomy  . POSTERIOR LUMBAR FUSION  04/08/2018   Laminectomy and Foraminotomy - Lumbar Four-Lumbar Five, instrumented posterolateral fusion Lumbar Four- Five, Laminectomy and Foraminotomy - Thoracic One-Thoracic Two  . POSTERIOR  LUMBAR FUSION  04/08/2018   Laminectomy and Foraminotomy - Lumbar Four-Lumbar Five, instrumented posterolateral fusion Lumbar Four- Five, Laminectomy and Foraminotomy - Thoracic One-Thoracic Two    Family History  Problem Relation Age of Onset  . Polycythemia Mother   . Diabetes Father   . Hypertension Father   . Heart disease Father   . CAD Unknown        1 of his 4 brothers and father has CAD    Social History:  Married. Was  independent prior to December. Family has been assisting due to progressive decline. Daughter in Sports coach a Marine scientist at [employer]. He  reports that he quit smoking about 32 years ago. His smoking use included cigarettes. He has a 40.00 pack-year smoking history. He quit smokeless tobacco use about 32 years ago. He reports that he does not drink alcohol or use drugs.    Allergies  Allergen Reactions  . Other Other (See Comments)    ANESTHESIA UNSPECIFIED  -- makes sick, must have antinausea medication in advance.  . Levofloxacin In D5w Itching    Possible infusion reaction 03/29/17. Tolerating oral Levaquin 03/30/17    Medications Prior to Admission  Medication Sig Dispense Refill  . aspirin EC 81 MG EC tablet Take 1 tablet (81 mg total) by mouth daily. 30 tablet 0  . baclofen (LIORESAL) 10 MG tablet Take 1 tablet (10 mg total) by mouth 3 (three) times daily. For muscle spasms 90 each 1  . escitalopram (LEXAPRO) 10 MG tablet Take 1 tablet (10 mg total) by mouth daily. 30 tablet 2  . gabapentin (NEURONTIN) 400 MG capsule TAKE 1 CAPSULE (400 MG TOTAL) BY MOUTH EVERY 8 (EIGHT) HOURS. 90 capsule 0  . glimepiride (AMARYL) 1 MG tablet Take 1 tablet (1 mg total) by mouth daily with breakfast. 30 tablet 0  . hydrALAZINE (APRESOLINE) 50 MG tablet Take 1 tablet (50 mg total) by mouth every 8 (eight) hours. 90 tablet 0  . levothyroxine (SYNTHROID, LEVOTHROID) 175 MCG tablet Take 1 tablet (175 mcg total) by mouth daily. 30 tablet 2  . losartan (COZAAR) 50 MG tablet Take 50 mg by mouth daily.    . polyethylene glycol (MIRALAX / GLYCOLAX) packet Take 17 g by mouth daily as needed. For constipation 14 each 0  . potassium chloride (KLOR-CON 10) 10 MEQ tablet Take 1 tablet (10 mEq total) by mouth 2 (two) times daily. 180 tablet 3  . pramipexole (MIRAPEX) 0.5 MG tablet Take 1 tablet (0.5 mg total) by mouth 2 (two) times daily. 60 tablet 0  . tamsulosin (FLOMAX) 0.4 MG CAPS capsule Take 1 capsule (0.4 mg total) by mouth daily.  30 capsule 0  . torsemide (DEMADEX) 20 MG tablet 1 tablet twice daily and 1 extra tablet daily as needed for swelling 270 tablet 3  . TOVIAZ 8 MG TB24 tablet Take 1 tablet (8 mg total) by mouth daily. 30 tablet 11  . ciprofloxacin (CIPRO) 500 MG tablet 500 mg 2 (two) times daily. X 10 days  0    Home: Home Living Family/patient expects to be discharged to:: Private residence Living Arrangements: Spouse/significant other  Functional History:   Functional Status:  Mobility:          ADL:    Cognition: Cognition Orientation Level: Oriented X4    Blood pressure (!) 108/58, pulse 76, temperature 99 F (37.2 C), temperature source Oral, resp. rate 18, height 6\' 1"  (1.854 m), weight (!) 137.9 kg (304 lb 0.2 oz), SpO2 93 %.  Physical Exam  Nursing note and vitals reviewed. Constitutional: He appears well-developed. He appears lethargic. He is easily aroused. No distress.  Morbidly obese male, lying in bed with towels on forehead. Reporting malaise.   HENT:  Head: Normocephalic and atraumatic.  Mouth/Throat: Oropharynx is clear and moist.  Eyes: Pupils are equal, round, and reactive to light. Conjunctivae and EOM are normal.  Neck: Normal range of motion. Neck supple.  Cardiovascular: Normal rate and regular rhythm.  Respiratory: Effort normal. No stridor. No respiratory distress. He has decreased breath sounds in the right lower field and the left lower field. He has no wheezes. He has no rales.  +[place]  GI: Soft. Bowel sounds are normal. He exhibits distension. There is no tenderness.  Musculoskeletal: He exhibits edema.  2+ pedal edema bilaterally with stasis changes. Foam dressing left shin.   Neurological: He is easily aroused. He appears lethargic.  Slurred speech.  Oriented X 2--thought he was here due to CHF.  Needed max cues to recall surgery.  He was able to follow simple one step motor commands without difficulty.  Sensory deficits BLE in stocking distribution. Motor:  B/l UE 4-4+/5 with ?dysmetria B/l LE: HF 3-/5, KE 3+/5, ADF 4-/5  Skin: Skin is warm and dry. He is not diaphoretic.  See above  Psychiatric: His affect is blunt. His speech is delayed. He is slowed. Cognition and memory are impaired.    Results for orders placed or performed during the hospital encounter of 04/08/18 (from the past 24 hour(s))  Glucose, capillary     Status: Abnormal   Collection Time: 04/08/18  1:45 PM  Result Value Ref Range   Glucose-Capillary 205 (H) 65 - 99 mg/dL   Comment 1 Notify RN   Glucose, capillary     Status: Abnormal   Collection Time: 04/08/18  3:04 PM  Result Value Ref Range   Glucose-Capillary 228 (H) 65 - 99 mg/dL  Glucose, capillary     Status: Abnormal   Collection Time: 04/08/18  9:43 PM  Result Value Ref Range   Glucose-Capillary 262 (H) 65 - 99 mg/dL   Comment 1 Notify RN    Comment 2 Document in Chart   Glucose, capillary     Status: Abnormal   Collection Time: 04/09/18  6:40 AM  Result Value Ref Range   Glucose-Capillary 209 (H) 65 - 99 mg/dL   Comment 1 Notify RN    Comment 2 Document in Chart    Dg Lumbar Spine 2-3 Views  Result Date: 04/08/2018 CLINICAL DATA:  Status post foraminotomies EXAM: LUMBAR SPINE - 2-3 VIEW COMPARISON:  Lumbar CT November 12, 2017 FLUOROSCOPY TIME:  1 minutes 15 seconds; 2 acquired images FINDINGS: Frontal and lateral views obtained. There is posterior screw and plate fixation at L4 and L5 bilaterally with screw tips in the respective vertebral bodies. No fracture spondylolisthesis. There is marked disc space narrowing at L2-3 and L3-4 with moderate disc space narrowing at L4-5. IMPRESSION: Postoperative changes at L4 and L5 with screw tips in the respective vertebral bodies. Multilevel osteoarthritic change. No fracture or spondylolisthesis. Electronically Signed   By: Lowella Grip III M.D.   On: 04/08/2018 12:07   Dg Thoracic Spine 1 View  Result Date: 04/08/2018 CLINICAL DATA:  Thoracic laminectomy  EXAM: THORACIC SPINE - 1 VIEW; DG C-ARM 61-120 MIN COMPARISON:  None. FLUOROSCOPY TIME:  Radiation Exposure Index (as provided by the fluoroscopic device): Not available If the device does not provide the exposure index: Fluoroscopy Time:  2 seconds Number of Acquired Images:  1 FINDINGS: Surgical retractors are noted over the T1 and T2 vertebra. Postsurgical changes in the lower cervical spine are seen. IMPRESSION: Thoracic localization intraoperatively. Electronically Signed   By: Inez Catalina M.D.   On: 04/08/2018 16:11   Dg C-arm 1-60 Min  Result Date: 04/08/2018 CLINICAL DATA:  Thoracic laminectomy EXAM: THORACIC SPINE - 1 VIEW; DG C-ARM 61-120 MIN COMPARISON:  None. FLUOROSCOPY TIME:  Radiation Exposure Index (as provided by the fluoroscopic device): Not available If the device does not provide the exposure index: Fluoroscopy Time:  2 seconds Number of Acquired Images:  1 FINDINGS: Surgical retractors are noted over the T1 and T2 vertebra. Postsurgical changes in the lower cervical spine are seen. IMPRESSION: Thoracic localization intraoperatively. Electronically Signed   By: Inez Catalina M.D.   On: 04/08/2018 16:11    Assessment/Plan: Diagnosis: Cervical and Lumbar myelopathy s/p decompression/arthrodesis Labs and images independently reviewed.  Records reviewed and summated above  1. Does the need for close, 24 hr/day medical supervision in concert with the patient's rehab needs make it unreasonable for this patient to be served in a less intensive setting? Yes  2. Co-Morbidities requiring supervision/potential complications: COPD (cont supplemental O2, monitor O2 sats with increased mobility), CKD (avoid nephrotoxic meds), OSA (monitor for daytime somnolence), combined systolic CHF (Monitor in accordance with increased physical activity and avoid UE resistance excercises), DDD with cervical myelopathy with tetraplegia and neuropathy, lumbar decompressive, DM (Monitor in accordance with exercise  and adjust meds as necessary), ABLA (transfuse if necessary to ensure appropriate perfusion for increased activity tolerance), post-op pain (Biofeedback training with therapies to help reduce reliance on opiate pain medications, monitor pain control during therapies, and sedation at rest and titrate to maximum efficacy to ensure participation and gains in therapies) 3. Due to bladder management, bowel management, safety, skin/wound care, disease management, pain management and patient education, does the patient require 24 hr/day rehab nursing? Yes 4. Does the patient require coordinated care of a physician, rehab nurse, PT (1-2 hrs/day, 5 days/week) and OT (1-2 hrs/day, 5 days/week) to address physical and functional deficits in the context of the above medical diagnosis(es)? Yes Addressing deficits in the following areas: balance, endurance, locomotion, strength, transferring, bowel/bladder control, bathing, dressing, toileting and psychosocial support 5. Can the patient actively participate in an intensive therapy program of at least 3 hrs of therapy per day at least 5 days per week? Potentially 6. The potential for patient to make measurable gains while on inpatient rehab is excellent 7. Anticipated functional outcomes upon discharge from inpatient rehab are min assist  with PT, min assist with OT, n/a with SLP. 8. Estimated rehab length of stay to reach the above functional goals is: 17-21 days. 9. Anticipated D/C setting: Home 10. Anticipated post D/C treatments: HH therapy and Home excercise program 11. Overall Rehab/Functional Prognosis: good  RECOMMENDATIONS: This patient's condition is appropriate for continued rehabilitative care in the following setting: CIR when able to tolerate 3 hours of therapy/day, hopefully in the next few days. Patient has agreed to participate in recommended program. Yes Note that insurance prior authorization may be required for reimbursement for recommended  care.  Comment: Rehab Admissions Coordinator to follow up.  Delice Lesch, MD, ABPMR Bary Leriche, [name] 04/09/2018

## 2018-04-09 NOTE — Progress Notes (Signed)
Patient appears to to be confuses, difficult to arouse, but answers questions appropriately but delayed. Care team call for Labs, and  ABG's. Team was given number to contact.

## 2018-04-09 NOTE — Consult Note (Signed)
PULMONARY / CRITICAL CARE MEDICINE   Name: Travis [name] MRN: [id number] DOB: 08-05-1946    ADMISSION DATE:  04/08/2018 CONSULTATION DATE:  04/09/18  REFERRING MD:  Hoyt Koch  CHIEF COMPLAINT:  Acute resp , acidosis, altered mental status  HISTORY OF PRESENT ILLNESS:    Travis [name] 72 year old gentleman who have known for several years.  He has had chronic diastolic congestive heart failure.  He has occasional episodes of acute heart failure-mostly due to  dietary indiscretion. Has additional history of OSA, mild pulmonary HTN. He has had severe leg weakness.  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  Today he had decompressive lumbar laminectomy.  I was called to see the patient by Dr. Hoyt Koch because the patient has become more obtunded and hypercarbic. The patient is presently on BIPAP ( he uses CPAP at home). His CXR shows a poor inspiration, possible atelectasis vs subpulmonic effusion on the right side I don't see gross evidence of CHF.    PAST MEDICAL HISTORY :  He  has a past medical history of Cervical myelopathy (HCC), CHF (congestive heart failure) ([place]), Constipation - functional, COPD (chronic obstructive pulmonary disease) ([place]), DJD (degenerative joint disease), Dyspnea, Gait disorder, Heart murmur, Hypertension, Hypothyroidism, Lumbosacral spinal stenosis (03/13/2014), Peripheral neuropathy, Pneumonia, PONV (postoperative nausea and vomiting), RBBB, Renal disorder, Restless legs syndrome (03/14/2015), RLS (restless legs syndrome), Sleep apnea, Thoracic myelopathy, and Type II diabetes mellitus ([place]).  PAST SURGICAL HISTORY: He  has a past surgical history that includes Cholecystectomy; Lumbar laminectomy/decompression microdiscectomy (07/14/2012); Cardiac catheterization ([name], 09/07/2015); Eye surgery; Posterior lumbar fusion (04/08/2018); Posterior lumbar fusion (04/08/2018); and Back surgery.  Allergies  Allergen Reactions  . Other Other (See  Comments)    ANESTHESIA UNSPECIFIED  -- makes sick, must have antinausea medication in advance.  . Levofloxacin In D5w Itching    Possible infusion reaction 03/29/17. Tolerating oral Levaquin 03/30/17    No current facility-administered medications on file prior to encounter.    Current Outpatient Medications on File Prior to Encounter  Medication Sig  . aspirin EC 81 MG EC tablet Take 1 tablet (81 mg total) by mouth daily.  . baclofen (LIORESAL) 10 MG tablet Take 1 tablet (10 mg total) by mouth 3 (three) times daily. For muscle spasms  . escitalopram (LEXAPRO) 10 MG tablet Take 1 tablet (10 mg total) by mouth daily.  [name] gabapentin (NEURONTIN) 400 MG capsule TAKE 1 CAPSULE (400 MG TOTAL) BY MOUTH EVERY 8 (EIGHT) HOURS.  [name] glimepiride (AMARYL) 1 MG tablet Take 1 tablet (1 mg total) by mouth daily with breakfast.  . hydrALAZINE (APRESOLINE) 50 MG tablet Take 1 tablet (50 mg total) by mouth every 8 (eight) hours.  [name] levothyroxine (SYNTHROID, LEVOTHROID) 175 MCG tablet Take 1 tablet (175 mcg total) by mouth daily.  [name] losartan (COZAAR) 50 MG tablet Take 50 mg by mouth daily.  . polyethylene glycol (MIRALAX / GLYCOLAX) packet Take 17 g by mouth daily as needed. For constipation  . potassium chloride (KLOR-CON 10) 10 MEQ tablet Take 1 tablet (10 mEq total) by mouth 2 (two) times daily.  . pramipexole (MIRAPEX) 0.5 MG tablet Take 1 tablet (0.5 mg total) by mouth 2 (two) times daily.  . tamsulosin (FLOMAX) 0.4 MG CAPS capsule Take 1 capsule (0.4 mg total) by mouth daily.  [name] torsemide (DEMADEX) 20 MG tablet 1 tablet twice daily and 1 extra tablet daily as needed for swelling  . TOVIAZ 8 MG TB24 tablet Take 1 tablet (8 mg  total) by mouth daily.    FAMILY HISTORY:  His indicated that his mother is deceased. He indicated that his father is deceased. He indicated that all of his four brothers are alive. He indicated that his maternal grandmother is deceased. He indicated that his maternal grandfather is  deceased. He indicated that his paternal grandmother is deceased. He indicated that his paternal grandfather is deceased. He indicated that the status of his unknown relative is unknown.   SOCIAL HISTORY: He  reports that he quit smoking about 32 years ago. His smoking use included cigarettes. He has a 40.00 pack-year smoking history. He quit smokeless tobacco use about 32 years ago. He reports that he does not drink alcohol or use drugs.  REVIEW OF SYSTEMS:   Not availabkle due to altered mental status   VITAL SIGNS: BP (!) 109/50 (BP Location: Left Arm)   Pulse 78   Temp 98.2 F (36.8 C) (Axillary)   Resp 20   Ht 6\' 1"  (1.854 m)   Wt (!) 304 lb 0.2 oz (137.9 kg)   SpO2 95%   BMI 40.11 kg/m      VENTILATOR SETTINGS: FiO2 (%):  [50 %] 50 %  INTAKE / OUTPUT: I/O last 3 completed shifts: In: 2118 [I.V.:2018; IV Piggyback:100] Out: 600 [Urine:300; Blood:300]  PHYSICAL EXAMINATION: General:  Older gentleman who is lethargic. Sitting in semi-fowlers in the ICU. He does arouse pretty easily now on BIPAP Neuro: Moves all extr HEENT:  [place]/AT, BIPAP Cardiovascular:  RRR s1S2 Lungs:  Bilateral; BS. Decreased BS at the right base. Poor respiratory excursions Abdomen: obese, non-tender Extr: [place]/c/e    LABS:  BMET Recent Labs  Lab 04/09/18 1456  NA 140  K 4.5  CL 101  CO2 30  BUN 46*  CREATININE 2.64*  GLUCOSE 216*    Electrolytes Recent Labs  Lab 04/09/18 1456  CALCIUM 8.2*    CBC Recent Labs  Lab 04/09/18 1456  WBC 8.7  HGB 10.2*  HCT 34.2*  PLT 125*    Coag's No results for input(s): APTT, INR in the last 168 hours.  Sepsis Markers No results for input(s): LATICACIDVEN, PROCALCITON, O2SATVEN in the last 168 hours.  ABG Recent Labs  Lab 04/09/18 1442  PHART 7.259*  PCO2ART 71.1*  PO2ART 61.7*    Liver Enzymes No results for input(s): AST, ALT, ALKPHOS, BILITOT, ALBUMIN in the last 168 hours.  Cardiac Enzymes No results for input(s):  TROPONINI, PROBNP in the last 168 hours.  Glucose Recent Labs  Lab 04/08/18 1345 04/08/18 1504 04/08/18 2143 04/09/18 0640 04/09/18 1141 04/09/18 1618  GLUCAP 205* 228* 262* 209* 152* 187*    Imaging Dg Chest 1 View  Result Date: 04/09/2018 CLINICAL DATA:  Shortness of breath. EXAM: CHEST  1 VIEW COMPARISON:  04/01/2018 FINDINGS: Lung volumes are low. There is patchy opacity at both lung bases. This is likely atelectasis. Pneumonia is possible. No convincing pulmonary edema. No apparent pleural effusion and no pneumothorax. Cardiac silhouette is partly obscured but grossly normal in size. No mediastinal or hilar masses. There stable changes from a thoracic and cervical spine fusion. IMPRESSION: 1. Bibasilar lung opacities most likely due to atelectasis accentuated by low lung volumes. Pneumonia is possible. No convincing pulmonary edema. Electronically Signed   By: Lajean Manes M.D.   On: 04/09/2018 15:01      ASSESSMENT / PLAN:  Altered Mental Status I feel that the combination of general anaesthesia and the fact that the patient was unable to use  his CPAP yesterday led to a progressive resp. Acidosis. He had a sleep study in 12/18 that showed moderately severe OSA. The patient had a spirometry recently that showed a moderately severe restrictive ventilatory defect which would predispose him to this problem He is a little more awake now on BIPAP. He had a ana cute resp acidosis earlier today Ph was 7.25/paCO2 71/ Pa02 62. CXR shows a poor inspiration can't r/o subpulmonic effusion on the right side At present the patient does not have an elevated temperature. His WBC was normal today. Blood sugar is a tad elevated at about 100-200. The patient does not have a cough. Patient shall remain NPO until he is more awke we will repeat his ABG shortly. The patient is at risk for requiring reintubation. I have discontinued some non-essential potentially sedating medications.  Cardiac The  patient has a known history of diastolic CHF. Recent ECHO showed a LVEF of 65-70%. There was evidence of grade 2 diastolic duysfn. Current CXR does not suggest CHF. His BP is only 100/60. It appears that his normal BP uiis about 726-203 systolic. His BUN/creatinine climbed yesterday to today as well. I don't believe that the patient is in acute CHF. I beelieve he is more likely a little dry so I am going to hold his lasix at this point.  Renal The patient has a bit of AKI. I am going to give him ginger hyyrdation we will monitor his creatinine and output. As noted I am discontinuing his lasix and celbrex at this point  DM Patient on his SS coverage and his glimepiride        [place] Pager: [phone]  04/09/2018, 5:09 PM

## 2018-04-10 DIAGNOSIS — J9621 Acute and chronic respiratory failure with hypoxia: Secondary | ICD-10-CM

## 2018-04-10 DIAGNOSIS — J9622 Acute and chronic respiratory failure with hypercapnia: Secondary | ICD-10-CM

## 2018-04-10 DIAGNOSIS — R0689 Other abnormalities of breathing: Secondary | ICD-10-CM

## 2018-04-10 LAB — BLOOD GAS, ARTERIAL
Acid-Base Excess: 4 mmol/L — ABNORMAL HIGH (ref 0.0–2.0)
Bicarbonate: 30.1 mmol/L — ABNORMAL HIGH (ref 20.0–28.0)
DELIVERY SYSTEMS: POSITIVE
Drawn by: 283401
Expiratory PAP: 8
FIO2: 50
Inspiratory PAP: 18
Mode: POSITIVE
O2 Saturation: 97.1 %
Patient temperature: 98.6
RATE: 15 resp/min
pCO2 arterial: 64.7 mmHg — ABNORMAL HIGH (ref 32.0–48.0)
pH, Arterial: 7.29 — ABNORMAL LOW (ref 7.350–7.450)
pO2, Arterial: 96.3 mmHg (ref 83.0–108.0)

## 2018-04-10 LAB — BASIC METABOLIC PANEL
Anion gap: 9 (ref 5–15)
BUN: 51 mg/dL — AB (ref 6–20)
CHLORIDE: 101 mmol/L (ref 101–111)
CO2: 29 mmol/L (ref 22–32)
Calcium: 8.2 mg/dL — ABNORMAL LOW (ref 8.9–10.3)
Creatinine, Ser: 2.72 mg/dL — ABNORMAL HIGH (ref 0.61–1.24)
GFR calc Af Amer: 25 mL/min — ABNORMAL LOW (ref >60)
GFR calc non Af Amer: 22 mL/min — ABNORMAL LOW (ref >60)
GLUCOSE: 171 mg/dL — AB (ref 65–99)
POTASSIUM: 4.8 mmol/L (ref 3.5–5.1)
SODIUM: 139 mmol/L (ref 135–145)

## 2018-04-10 LAB — MAGNESIUM: MAGNESIUM: 2.2 mg/dL (ref 1.7–2.4)

## 2018-04-10 LAB — GLUCOSE, CAPILLARY
GLUCOSE-CAPILLARY: 178 mg/dL — AB (ref 65–99)
GLUCOSE-CAPILLARY: 212 mg/dL — AB (ref 65–99)
Glucose-Capillary: 140 mg/dL — ABNORMAL HIGH (ref 65–99)
Glucose-Capillary: 152 mg/dL — ABNORMAL HIGH (ref 65–99)

## 2018-04-10 LAB — T4, FREE: FREE T4: 1.58 ng/dL — AB (ref 0.61–1.12)

## 2018-04-10 LAB — BRAIN NATRIURETIC PEPTIDE: B NATRIURETIC PEPTIDE 5: 238.1 pg/mL — AB (ref 0.0–100.0)

## 2018-04-10 LAB — TSH: TSH: 1.163 u[IU]/mL (ref 0.350–4.500)

## 2018-04-10 LAB — PHOSPHORUS: PHOSPHORUS: 5.3 mg/dL — AB (ref 2.5–4.6)

## 2018-04-10 NOTE — Progress Notes (Signed)
Neurosurgery Progress Note  Events noted of yesterday Today, patient reports feeling well. Does not recall past 24 hours since being in the hospital. Complains of back soreness and leg soreness.  Denies any focal deficits.  EXAM:  BP 119/66   Pulse 73   Temp 98.6 F (37 C) (Oral)   Resp 17   Ht 6\' 1"  (1.854 m)   Wt (!) 137.9 kg (304 lb 0.2 oz)   SpO2 95%   BMI 40.11 kg/m   Awake, alert, oriented  Speech fluent, appropriate    Moves all extremities well with good strength  incision is clean, dry and intact  PLAN Clinically looks better this morning.  Appreciate PCCM assistance with care. Stable from neurosurgical perspective.   Back soreness not unexpected.  Reports manageable. Okay to start mobilizing today.

## 2018-04-10 NOTE — Progress Notes (Signed)
Physical Therapy Treatment Patient Details Name: Travis [name] MRN: [id number] DOB: 06-29-1946 Today's Date: 04/10/2018    History of Present Illness Pt is a 72 yo male admitted for decompressive lumbar laminectomy with hemifacetectomy and foraminotomy at L4-5 B[place].  Pt also had C7-T1 medial facetectomry and foraminotomy.  Pt with PMH of previous lumbar surgeries, COPD, CKD, OSA, CHF, DDD. Pt not on home O2 but on 3L today.      PT Comments    Pt received in bed with nursing and family present ready to participate in PT.  Pt having trouble remembering his spinal precautions.  ICU bed placed in chair position to allow pt to transfer off foot of bed into standing.  Pt completed 1 standing/balance trial requiring Mod A +2 while nursing changed his bed and bathed his back.  Verybal Cues required for sequencing, and to keep head upright during standing trial.   Pt unable to fully extend hips/knees with Max A +2.  SPO2 drops to low 80's but returns to 90's quickly upon sitting and pursed lip breathing.  During standing trial pt took two steps forward slowly requiring Max cues for sequencing and Max A +2 to stay upright and maintain balance.  During bed mobility pt required cues to sit back w/o twisting and Mod A to lower trunk into supine w/o twisting.  He cant maintain supine long due to inability to breathe in supine.  Pt left in bed with HOB raised and all needs met, with nursing and family present.  Emphasize spinal precautions in future sessions and general standing endurance.  Pt remains a good candidate for CIR after d/c.   Follow Up Recommendations  CIR     Equipment Recommendations  None recommended by PT    Recommendations for Other Services Rehab consult     Precautions / Restrictions Precautions Precautions: Fall;Back;Cervical Precaution Comments: pt's family familiar with back precautions from previous surgeries, but pt is not.  No brace required Restrictions Weight Bearing  Restrictions: No    Mobility  Bed Mobility Overal bed mobility: Needs Assistance Bed Mobility: Sit to Supine       Sit to supine: +2 for physical assistance;Mod assist(Modified long sitting to supine)   General bed mobility comments: Pt required cues to sit back w/o twisting and Mod A to lower trunk into supine.  (pt cant maintain supine long due to inability to breathe in supine.  ICU bed used and placed in chair position so pt could transfer off foot of bed.  Transfers Overall transfer level: Needs assistance Equipment used: Rolling walker (2 wheeled) Transfers: Sit to/from Stand Sit to Stand: Max assist;+2 physical assistance         General transfer comment: Max A for lifting, for upright posture, pt unable to fully extend hips and knees despite +2 assist. fatigues quickly with spO2 dropping to 80's but quickly returns to 90's with seated rest  Ambulation/Gait Ambulation/Gait assistance: Max assist;+2 physical assistance Ambulation Distance (Feet): 1 Feet Assistive device: Rolling walker (2 wheeled) Gait Pattern/deviations: Step-to pattern;Decreased step length - right;Decreased step length - left;Decreased dorsiflexion - right;Decreased dorsiflexion - left;Decreased weight shift to right;Decreased weight shift to left Gait velocity: reduced   General Gait Details: Pt took two very slow steps forward and backward needing Max cues for sequencing and Max A +2 to stay upright and maintain balance.     Stairs             Wheelchair Mobility    Modified Rankin (  Stroke Patients Only)       Balance Overall balance assessment: Needs assistance Sitting-balance support: Bilateral upper extremity supported;Feet supported     Postural control: Posterior lean Standing balance support: Bilateral upper extremity supported;During functional activity   Standing balance comment: must have extensive outside assist to stand.                            Cognition  Arousal/Alertness: Lethargic   Overall Cognitive Status: Within Functional Limits for tasks assessed                     Current Attention Level: Sustained   Following Commands: Follows one step commands consistently   Awareness: Emergent          Exercises      General Comments General comments (skin integrity, edema, etc.): pt on 3LO2 during session, spO2 drops with activity but is able to return to 90's with seated rest      Pertinent Vitals/Pain Pain Assessment: Faces Faces Pain Scale: Hurts a little bit Pain Location: Lower back Pain Descriptors / Indicators: Grimacing;Guarding;Discomfort Pain Intervention(s): Limited activity within patient's tolerance;Monitored during session    Home Living                      Prior Function            PT Goals (current goals can now be found in the care plan section) Acute Rehab PT Goals Patient Stated Goal: to walk again PT Goal Formulation: With patient/family Time For Goal Achievement: 04/23/18 Potential to Achieve Goals: Fair Progress towards PT goals: Progressing toward goals    Frequency    Min 5X/week      PT Plan Current plan remains appropriate    Co-evaluation     PT goals addressed during session: Mobility/safety with mobility        AM-PAC PT "6 Clicks" Daily Activity  Outcome Measure  Difficulty turning over in bed (including adjusting bedclothes, sheets and blankets)?: A Lot Difficulty moving from lying on back to sitting on the side of the bed? : A Lot Difficulty sitting down on and standing up from a chair with arms (e.g., wheelchair, bedside commode, etc,.)?: A Lot Help needed moving to and from a bed to chair (including a wheelchair)?: A Lot Help needed walking in hospital room?: Total   6 Click Score: 9    End of Session Equipment Utilized During Treatment: Oxygen;Gait belt Activity Tolerance: Patient limited by fatigue Patient left: with call bell/phone within  reach;with nursing/sitter in room;with family/visitor present;in bed Nurse Communication: Mobility status;Need for lift equipment PT Visit Diagnosis: Unsteadiness on feet (R26.81);Muscle weakness (generalized) (M62.81);Difficulty in walking, not elsewhere classified (R26.2)     Time: 6378-5885 PT Time Calculation (min) (ACUTE ONLY): 24 min  Charges:  $Therapeutic Activity: 23-37 mins                    G Codes:       Terri Skains, SPTA [phone]    Terri Skains 04/10/2018, 5:14 PM

## 2018-04-10 NOTE — Progress Notes (Signed)
Progress Note  Patient Name: Travis [name] Date of Encounter: 04/10/2018  Primary Cardiologist: Mertie Moores, MD   Subjective   Feeling much better.  No chest pain or shortness of breath.   Inpatient Medications    Scheduled Meds: . escitalopram  10 mg Oral Daily  . fesoterodine  8 mg Oral Daily  . gabapentin  400 mg Oral TID  . hydrALAZINE  50 mg Oral Q8H  . insulin aspart  0-15 Units Subcutaneous TID WC  . levothyroxine  175 mcg Oral QAC breakfast  . pramipexole  0.5 mg Oral BID  . senna  1 tablet Oral BID  . tamsulosin  0.4 mg Oral Daily   Continuous Infusions: . lactated ringers 75 mL/hr at 04/10/18 1200   PRN Meds: acetaminophen **OR** acetaminophen, HYDROmorphone (DILAUDID) injection, menthol-cetylpyridinium **OR** phenol, ondansetron **OR** ondansetron (ZOFRAN) IV, oxyCODONE   Vital Signs    Vitals:   04/10/18 1000 04/10/18 1100 04/10/18 1144 04/10/18 1200  BP: (!) 123/54 127/66  (!) 145/67  Pulse: 76 75  75  Resp: 18 18  20   Temp:   97.6 F (36.4 C)   TempSrc:   Oral   SpO2: 95% 93%  96%  Weight:      Height:        Intake/Output Summary (Last 24 hours) at 04/10/2018 1229 Last data filed at 04/10/2018 1200 Gross per 24 hour  Intake 1287.5 ml  Output 660 ml  Net 627.5 ml   Filed Weights   04/08/18 0614 04/08/18 1733  Weight: 292 lb (132.5 kg) (!) 304 lb 0.2 oz (137.9 kg)    Telemetry    Sinus rhythm, sinus bradycardia.  Rare PVCs.  - Personally Reviewed  ECG    n/a - Personally Reviewed  Physical Exam   VS:  BP (!) 145/67 (BP Location: Left Arm)   Pulse 75   Temp 97.6 F (36.4 C) (Oral)   Resp 20   Ht 6\' 1"  (1.854 m)   Wt (!) 304 lb 0.2 oz (137.9 kg)   SpO2 96%   BMI 40.11 kg/m  , BMI Body mass index is 40.11 kg/m. GENERAL:  Well appearing.  No acute distress.  HEENT: Pupils equal round and reactive, fundi not visualized, oral mucosa unremarkable NECK:  Jugular venous distention 2cm above clavicle at 45 degrees.  waveform  within normal limits, carotid upstroke brisk and symmetric, no bruits, LUNGS:  Clear to auscultation bilaterally HEART:  RRR.  PMI not displaced or sustained,S1 and S2 within normal limits, no S3, no S4, no clicks, no rubs, no murmurs ABD:  Flat, positive bowel sounds normal in frequency in pitch, no bruits, no rebound, no guarding, no midline pulsatile mass, no hepatomegaly, no splenomegaly EXT:  2 plus pulses throughout, 2+ pitting edema to the upper tibia bilaterally. No cyanosis no clubbing SKIN:  No rashes no nodules NEURO:  Cranial nerves II through XII grossly intact, motor grossly intact throughout [facility]:  Cognitively intact, oriented to person place and time   Labs    Chemistry Recent Labs  Lab 04/09/18 1456 04/10/18 0405  NA 140 139  K 4.5 4.8  CL 101 101  CO2 30 29  GLUCOSE 216* 171*  BUN 46* 51*  CREATININE 2.64* 2.72*  CALCIUM 8.2* 8.2*  GFRNONAA 22* 22*  GFRAA 26* 25*  ANIONGAP 9 9     Hematology Recent Labs  Lab 04/09/18 1456  WBC 8.7  RBC 3.55*  HGB 10.2*  HCT 34.2*  MCV 96.3  MCH 28.7  MCHC 29.8*  RDW 17.1*  PLT 125*    Cardiac EnzymesNo results for input(s): TROPONINI in the last 168 hours. No results for input(s): TROPIPOC in the last 168 hours.   BNP Recent Labs  Lab 04/10/18 0405  BNP 238.1*     DDimer No results for input(s): DDIMER in the last 168 hours.   Radiology    Dg Chest 1 View  Result Date: 04/09/2018 CLINICAL DATA:  Shortness of breath. EXAM: CHEST  1 VIEW COMPARISON:  04/01/2018 FINDINGS: Lung volumes are low. There is patchy opacity at both lung bases. This is likely atelectasis. Pneumonia is possible. No convincing pulmonary edema. No apparent pleural effusion and no pneumothorax. Cardiac silhouette is partly obscured but grossly normal in size. No mediastinal or hilar masses. There stable changes from a thoracic and cervical spine fusion. IMPRESSION: 1. Bibasilar lung opacities most likely due to atelectasis accentuated  by low lung volumes. Pneumonia is possible. No convincing pulmonary edema. Electronically Signed   By: Lajean Manes M.D.   On: 04/09/2018 15:01    Cardiac Studies   ECHO:  02/2017 - Left ventricle: The cavity size was normal. Wall thickness was increased in a pattern of mild LVH. Systolic function was vigorous. The estimated ejection fraction was in the range of 65% to 70%. Wall motion was normal; there were no regional wall motion abnormalities. Doppler parameters are consistent with pseudonormal left ventricular relaxation (grade 2 diastolic dysfunction). The E/A ratio is >1.5. The E/e&' ratio is >15, suggesting elevated LV filling pressure. - Left atrium: The atrium was mildly dilated. - Inferior vena cava: The vessel was normal in size. The respirophasic diameter changes were in the normal range (>= 50%), consistent with normal central venous pressure. Impressions: - Compared to a prior study in 2016, there is no significant change. LV filling pressure is elevated.   Patient Profile     72 y.o. male with chronic diastolic heart failure, OSA, hypertension, diabetes and mild pulmonary hypertension admitted 04/08/18 for back surgery and developed increased shortness of breath post operatively.  Assessment & Plan    # Acure hypercarbic respiratory failure: Patient became more lethargic and had minimal UOP on IV lasix 04/09/18.    Rapid response was called and hew as found to have hypercarbic and hypoxic respiratory failure.  He required BiPaP.  No signficant pulmonary edema on CXR.  Thought to be due to OSA, inability to use CPAP prior to surgery and sedation from anesthesia.   # Chronic diastolic heart failure:  Patient was euvolemic to dry and started on IV fluids continuous last night.  BNP mildly elevated and he is now slightly volume overloaded.  Will stop IV fluids.  No lasix today and will order Unna boots.  Likely resume lasix tomorrow.   # CKD 3:  Acute  on chronic renal failure.  Lasix/torsemide and losartan on hold.   # Hypertension: BP labile.  Hydralazine has been on hold.  Losartan stopped 2/2 AKI and low BP.  Likely resume hydralazine tomorrow.      For questions or updates, please contact [place] Please consult [email or web address] for contact info under Cardiology/STEMI.      Signed, Skeet Latch, MD  04/10/2018, 12:29 PM

## 2018-04-10 NOTE — Progress Notes (Signed)
Orthopedic Tech Progress Note Patient Details:  Travis [name] [birth year] [id number]  Ortho Devices Type of Ortho Device: Haematologist Ortho Device/Splint Location: bilateral Ortho Device/Splint Interventions: Application   Post Interventions Patient Tolerated: Well Instructions Provided: Care of device   [name] 04/10/2018, 2:43 PM

## 2018-04-10 NOTE — Progress Notes (Signed)
PULMONARY / CRITICAL CARE MEDICINE   Name: Travis [name] MRN: [id number] DOB: 12-Jul-1946    ADMISSION DATE:  04/08/2018 CONSULTATION DATE:  04/09/18  REFERRING MD:  Hoyt Koch  CHIEF COMPLAINT:  Acute resp , acidosis, altered mental status  HISTORY OF PRESENT ILLNESS:    72 year old male patient with history of chronic diastolic heart failure, obstructive sleep apnea, mild pulmonary artery hypertension.  Status post decompressive lumbar laminectomy on 4/12.  Critical care asked to see in consult postoperatively due to altered sensorium and progressive hypercarbic respiratory failure  Subjective Fully awake, alert, appropriate.  No distress.   Objective VITAL SIGNS: Blood Pressure 119/66   Pulse 73   Temperature 98.6 F (37 C) (Oral)   Respiration 17   Height 6\' 1"  (1.854 m)   Weight (Abnormal) 304 lb 0.2 oz (137.9 kg)   Oxygen Saturation 95%   Body Mass Index 40.11 kg/m      VENTILATOR SETTINGS: FiO2 (%):  [50 %] 50 %  INTAKE / OUTPUT:  Intake/Output Summary (Last 24 hours) at 04/10/2018 0912 Last data filed at 04/10/2018 0600 Gross per 24 hour  Intake 837.5 ml  Output 400 ml  Net 437.5 ml     PHYSICAL EXAMINATION: General: Is a 72 year old white male currently resting comfortably in bed.  No acute distress.  Fully awake and interactive. HEENT: Normocephalic atraumatic.  Mucous membranes are dry and cracked.  There is no jugular venous distention Pulmonary: Clear to auscultation diminished bases no accessory use Cardiac: Regular rate and rhythm without murmur rub or gallop Extremities/musculoskeletal: Warm, dry, pitting lower extremity edema, brisk cap refill, strong pulses. Abdomen: Large, distended, hypoactive bowel sounds. Neuro: Awake oriented no focal deficits, strength reported as improved    LABS:  BMET Recent Labs  Lab 04/09/18 1456 04/10/18 0405  NA 140 139  K 4.5 4.8  CL 101 101  CO2 30 29  BUN 46* 51*  CREATININE 2.64* 2.72*  GLUCOSE 216*  171*    Electrolytes Recent Labs  Lab 04/09/18 1456 04/10/18 0405  CALCIUM 8.2* 8.2*  MG  --  2.2  PHOS  --  5.3*    CBC Recent Labs  Lab 04/09/18 1456  WBC 8.7  HGB 10.2*  HCT 34.2*  PLT 125*    Coag's No results for input(s): APTT, INR in the last 168 hours.  Sepsis Markers Recent Labs  Lab 04/09/18 1839  PROCALCITON <0.10    ABG Recent Labs  Lab 04/09/18 1745 04/09/18 2132 04/10/18 0300  PHART 7.262* 7.267* 7.290*  PCO2ART 70.2* 69.3* 64.7*  PO2ART 77.3* 85.6 96.3    Liver Enzymes No results for input(s): AST, ALT, ALKPHOS, BILITOT, ALBUMIN in the last 168 hours.  Cardiac Enzymes No results for input(s): TROPONINI, PROBNP in the last 168 hours.  Glucose Recent Labs  Lab 04/08/18 2143 04/09/18 0640 04/09/18 1141 04/09/18 1618 04/09/18 2257 04/10/18 0819  GLUCAP 262* 209* 152* 187* 162* 140*    Imaging Dg Chest 1 View  Result Date: 04/09/2018 CLINICAL DATA:  Shortness of breath. EXAM: CHEST  1 VIEW COMPARISON:  04/01/2018 FINDINGS: Lung volumes are low. There is patchy opacity at both lung bases. This is likely atelectasis. Pneumonia is possible. No convincing pulmonary edema. No apparent pleural effusion and no pneumothorax. Cardiac silhouette is partly obscured but grossly normal in size. No mediastinal or hilar masses. There stable changes from a thoracic and cervical spine fusion. IMPRESSION: 1. Bibasilar lung opacities most likely due to atelectasis accentuated by low lung volumes. Pneumonia  is possible. No convincing pulmonary edema. Electronically Signed   By: Lajean Manes M.D.   On: 04/09/2018 15:01      ASSESSMENT / PLAN:  Acute encephalopathy in the setting of hypercarbia and residual anesthesia.  Now at baseline, completely awake and oriented. Plan Continue supportive care  conservative with sedating medications and narcotics Mandatory at bedtime with BiPAP for now.  Acute on chronic hypercarbic respiratory failure in the  setting of residual anesthesia, atelectasis, and hypoventilation. History of obstructive sleep apnea +/- obesity hypoventilation syndrome -Baseline CO2 calculates around 50 -Chest x-ray reviewed from yesterday on 4/12 demonstrates bibasilar atelectasis Plan Wean oxygen Mandatory at bedtime BiPAP, will keep as needed as well Add incentive spirometry Out of bed as soon as okay with neurosurgery  Diastolic heart failure Recent ECHO showed a LVEF of 65-70%. There was evidence of grade 2 diastolic dysfxn Plan Holding Lasix today Continue telemetry monitoring Okay to continue Apresoline  Acute kidney injury Underlying chronic kidney disease.  Appears as though baseline creatinine around 1.32.  Currently up to 2.72.  He got 2 doses of Lasix last night Plan Hold further diuresis Allow oral intake Follow-up a.m. chemistry Hold losartan Continue lactated Ringer's at 75 mL's an hour Cont Toviaz and flomax  Anemia  -Hemoglobin holding stable Plan Continue PAS hose Subcu heparin when okay with neurosurgery A.m CBC  Diabetes Plan Holding oral medications Continue sliding scale insulin  Hypothyroidism  plan Continue Synthroid  Would keep ICU or SDU another 24 hrs. Clinically looks OK Need to mobilize.   Erick Colace ACNP-BC [place] Pager # [phone] OR # [phone] if no answer  04/10/2018, 9:11 AM

## 2018-04-11 ENCOUNTER — Inpatient Hospital Stay (HOSPITAL_COMMUNITY): Payer: Medicare Other

## 2018-04-11 DIAGNOSIS — R0689 Other abnormalities of breathing: Secondary | ICD-10-CM

## 2018-04-11 DIAGNOSIS — J9811 Atelectasis: Secondary | ICD-10-CM

## 2018-04-11 DIAGNOSIS — J449 Chronic obstructive pulmonary disease, unspecified: Secondary | ICD-10-CM

## 2018-04-11 DIAGNOSIS — G4733 Obstructive sleep apnea (adult) (pediatric): Secondary | ICD-10-CM

## 2018-04-11 DIAGNOSIS — Z9989 Dependence on other enabling machines and devices: Secondary | ICD-10-CM

## 2018-04-11 DIAGNOSIS — A419 Sepsis, unspecified organism: Secondary | ICD-10-CM

## 2018-04-11 DIAGNOSIS — N183 Chronic kidney disease, stage 3 (moderate): Secondary | ICD-10-CM

## 2018-04-11 DIAGNOSIS — R0602 Shortness of breath: Secondary | ICD-10-CM

## 2018-04-11 LAB — CBC
HCT: 35 % — ABNORMAL LOW (ref 39.0–52.0)
HEMATOCRIT: 31.7 % — AB (ref 39.0–52.0)
HEMOGLOBIN: 9.6 g/dL — AB (ref 13.0–17.0)
HEMOGLOBIN: 10.6 g/dL — AB (ref 13.0–17.0)
MCH: 28.7 pg (ref 26.0–34.0)
MCH: 29 pg (ref 26.0–34.0)
MCHC: 30.3 g/dL (ref 30.0–36.0)
MCHC: 30.3 g/dL (ref 30.0–36.0)
MCV: 94.9 fL (ref 78.0–100.0)
MCV: 95.6 fL (ref 78.0–100.0)
Platelets: 137 10*3/uL — ABNORMAL LOW (ref 150–400)
Platelets: 153 10*3/uL (ref 150–400)
RBC: 3.34 MIL/uL — AB (ref 4.22–5.81)
RBC: 3.66 MIL/uL — AB (ref 4.22–5.81)
RDW: 16.9 % — ABNORMAL HIGH (ref 11.5–15.5)
RDW: 16.8 % — ABNORMAL HIGH (ref 11.5–15.5)
WBC: 6.1 10*3/uL (ref 4.0–10.5)
WBC: 9.9 10*3/uL (ref 4.0–10.5)

## 2018-04-11 LAB — BASIC METABOLIC PANEL
ANION GAP: 8 (ref 5–15)
BUN: 57 mg/dL — ABNORMAL HIGH (ref 6–20)
CALCIUM: 8.3 mg/dL — AB (ref 8.9–10.3)
CHLORIDE: 100 mmol/L — AB (ref 101–111)
CO2: 29 mmol/L (ref 22–32)
Creatinine, Ser: 2.33 mg/dL — ABNORMAL HIGH (ref 0.61–1.24)
GFR calc non Af Amer: 26 mL/min — ABNORMAL LOW (ref >60)
GFR, EST AFRICAN AMERICAN: 30 mL/min — AB (ref >60)
Glucose, Bld: 195 mg/dL — ABNORMAL HIGH (ref 65–99)
POTASSIUM: 4.7 mmol/L (ref 3.5–5.1)
Sodium: 137 mmol/L (ref 135–145)

## 2018-04-11 LAB — URINALYSIS, ROUTINE W REFLEX MICROSCOPIC
Bilirubin Urine: NEGATIVE
Glucose, UA: NEGATIVE mg/dL
Hgb urine dipstick: NEGATIVE
Ketones, ur: NEGATIVE mg/dL
Nitrite: NEGATIVE
PROTEIN: NEGATIVE mg/dL
Specific Gravity, Urine: 1.014 (ref 1.005–1.030)
pH: 5 (ref 5.0–8.0)

## 2018-04-11 LAB — BLOOD GAS, ARTERIAL
Acid-Base Excess: 5.2 mmol/L — ABNORMAL HIGH (ref 0.0–2.0)
BICARBONATE: 31.3 mmol/L — AB (ref 20.0–28.0)
DRAWN BY: 283401
Delivery systems: POSITIVE
EXPIRATORY PAP: 8
FIO2: 0.4
O2 Saturation: 93.1 %
Patient temperature: 98.6
pCO2 arterial: 65.5 mmHg (ref 32.0–48.0)
pH, Arterial: 7.301 — ABNORMAL LOW (ref 7.350–7.450)
pO2, Arterial: 69.2 mmHg — ABNORMAL LOW (ref 83.0–108.0)

## 2018-04-11 LAB — BRAIN NATRIURETIC PEPTIDE: B NATRIURETIC PEPTIDE 5: 120.9 pg/mL — AB (ref 0.0–100.0)

## 2018-04-11 LAB — PROCALCITONIN

## 2018-04-11 LAB — GLUCOSE, CAPILLARY
GLUCOSE-CAPILLARY: 142 mg/dL — AB (ref 65–99)
Glucose-Capillary: 175 mg/dL — ABNORMAL HIGH (ref 65–99)
Glucose-Capillary: 217 mg/dL — ABNORMAL HIGH (ref 65–99)
Glucose-Capillary: 191 mg/dL — ABNORMAL HIGH (ref 65–99)

## 2018-04-11 LAB — LACTIC ACID, PLASMA: LACTIC ACID, VENOUS: 0.9 mmol/L (ref 0.5–1.9)

## 2018-04-11 MED ORDER — TORSEMIDE 20 MG PO TABS
20.0000 mg | ORAL_TABLET | Freq: Every day | ORAL | Status: DC
  Administered 2018-04-11: 20 mg via ORAL
  Filled 2018-04-11: qty 1

## 2018-04-11 MED ORDER — FUROSEMIDE 10 MG/ML IJ SOLN
40.0000 mg | Freq: Two times a day (BID) | INTRAMUSCULAR | Status: DC
  Administered 2018-04-11: 40 mg via INTRAVENOUS
  Filled 2018-04-11: qty 4

## 2018-04-11 MED ORDER — HYDRALAZINE HCL 50 MG PO TABS: 100.0000 mg | ORAL_TABLET | Freq: Three times a day (TID) | ORAL | Status: DC

## 2018-04-11 MED ORDER — VANCOMYCIN HCL 10 G IV SOLR
2500.0000 mg | Freq: Once | INTRAVENOUS | Status: AC
  Administered 2018-04-11: 2500 mg via INTRAVENOUS
  Filled 2018-04-11: qty 2500

## 2018-04-11 MED ORDER — PIPERACILLIN-TAZOBACTAM 3.375 G IVPB
3.3750 g | Freq: Three times a day (TID) | INTRAVENOUS | Status: DC
  Administered 2018-04-11 – 2018-04-13 (×6): 3.375 g via INTRAVENOUS
  Filled 2018-04-11 (×7): qty 50

## 2018-04-11 MED ORDER — VANCOMYCIN HCL 10 G IV SOLR
1500.0000 mg | INTRAVENOUS | Status: DC
  Filled 2018-04-11: qty 1500

## 2018-04-11 NOTE — Progress Notes (Addendum)
PULMONARY / CRITICAL CARE MEDICINE   Name: Travis [name] MRN: [id number] DOB: [birth year]    ADMISSION DATE:  04/08/2018 CONSULTATION DATE:  04/09/18  REFERRING MD:  Hoyt Koch  CHIEF COMPLAINT:  Acute resp , acidosis, altered mental status  HISTORY OF PRESENT ILLNESS:    72 year old male patient with history of chronic diastolic heart failure, obstructive sleep apnea, mild pulmonary artery hypertension.  Status post decompressive lumbar laminectomy on 4/12.  Critical care asked to see in consult postoperatively due to altered sensorium and progressive hypercarbic respiratory failure  Subjective Awake alert no focal deficits and now more confused & having rigors    Objective VITAL SIGNS: Blood Pressure (Abnormal) 153/67   Pulse 89   Temperature 98 F (36.7 C) (Axillary)   Respiration 17   Height 6\' 1"  (1.854 m)   Weight (Abnormal) 306 lb 10.6 oz (139.1 kg)   Oxygen Saturation (Abnormal) 87%   Body Mass Index 40.46 kg/m      VENTILATOR SETTINGS:    INTAKE / OUTPUT:  Intake/Output Summary (Last 24 hours) at 04/11/2018 0910 Last data filed at 04/11/2018 0800 Gross per 24 hour  Intake 300 ml  Output 1870 ml  Net -1570 ml     PHYSICAL EXAMINATION: General: 72 year old white male currently w/ increased WOB, flushed appearance  HEENT: Normocephalic atraumatic no jugular venous distention Cardiac: Regular rate and rhythm without murmur rub or gallop Pulmonary: Decreased bases, no accessory use.  No wheezing. Some increased RR  Abdomen: Soft nontender no organomegaly Extremities/musculoskeletal: Improving strength, brisk cap refill, bilateral lower extremity edema. Neuro/psych: Awake alert oriented no focal deficits.    LABS:  BMET Recent Labs  Lab 04/09/18 1456 04/10/18 0405 04/11/18 0407  NA 140 139 137  K 4.5 4.8 4.7  CL 101 101 100*  CO2 30 29 29   BUN 46* 51* 57*  CREATININE 2.64* 2.72* 2.33*  GLUCOSE 216* 171* 195*    Electrolytes Recent Labs  Lab  04/09/18 1456 04/10/18 0405 04/11/18 0407  CALCIUM 8.2* 8.2* 8.3*  MG  --  2.2  --   PHOS  --  5.3*  --     CBC Recent Labs  Lab 04/09/18 1456 04/11/18 0407  WBC 8.7 6.1  HGB 10.2* 9.6*  HCT 34.2* 31.7*  PLT 125* 137*    Coag's No results for input(s): APTT, INR in the last 168 hours.  Sepsis Markers Recent Labs  Lab 04/09/18 1839  PROCALCITON <0.10    ABG Recent Labs  Lab 04/09/18 2132 04/10/18 0300 04/11/18 0359  PHART 7.267* 7.290* 7.301*  PCO2ART 69.3* 64.7* 65.5*  PO2ART 85.6 96.3 69.2*    Liver Enzymes No results for input(s): AST, ALT, ALKPHOS, BILITOT, ALBUMIN in the last 168 hours.  Cardiac Enzymes No results for input(s): TROPONINI, PROBNP in the last 168 hours.  Glucose Recent Labs  Lab 04/09/18 2257 04/10/18 0819 04/10/18 1137 04/10/18 1535 04/10/18 2129 04/11/18 0820  GLUCAP 162* 140* 178* 152* 212* 142*    Imaging No results found.   cultures UC 4/14>>> Blood 4/14>>>  abx vanc 4/14>>> Zosyn 4/14>>>  ASSESSMENT / PLAN:  Acute encephalopathy in the setting of hypercarbia and residual anesthesia. Now possibly sepsis and fever related  Plan Supportive care.   Acute on chronic hypercarbic respiratory failure in the setting of residual anesthesia, atelectasis, and hypoventilation. History of obstructive sleep apnea +/- obesity hypoventilation syndrome -Baseline CO2 calculates around 50 -Chest x-ray reviewed from on 4/12 demonstrates bibasilar atelectasis Plan AutoSet BiPAP at night and PRN.  Will re-assess; may need to consider intubation Oxygen during day weaning for sats greater than 90  SIRS w/ rigors. ? UTI. Acting as if acutely infected.  Plan Ck LA, UA, PCT Will repeat CBC He historically has presented like this w/ UTIs in past. Will start empiric abx coverage   Diastolic heart failure; now hypertensive Recent ECHO showed a LVEF of 65-70%. There was evidence of grade 2 diastolic dysfxn Plan Holding diuresis   Dc antihypertensives for now  Hold Cozaar another day  Acute kidney injury Underlying chronic kidney disease, stage III.  Appears as though baseline creatinine around 1.32.  Currently up to 2.72.  He got 2 doses of Lasix  On 4/12 creatinines improved. Plan He had resumed demadex, will hold  Normal saline lock Follow-up a.m. chemistry  Anemia  -Hemoglobin holding stable Plan PAS hose to lower extremities  Diabetes Plan Holding oral medications Continue sliding scale insulin  Hypothyroidism  plan Continue Synthroid  Keep in ICU DVT prophylaxis: scd SUP: na Diet: reg Activity: OOB Disposition : sdu  Erick Colace ACNP-BC [place] Pager # [phone] OR # [phone] if no answer  04/11/2018, 9:10 AM

## 2018-04-11 NOTE — Progress Notes (Addendum)
Progress Note  Patient Name: Travis [name] Date of Encounter: 04/11/2018  Primary Cardiologist: Mertie Moores, MD   Subjective   Feeling short of breath.  BiPAP replaced this morning.  Whole body jerking and tremors.   Inpatient Medications    Scheduled Meds: . escitalopram  10 mg Oral Daily  . fesoterodine  8 mg Oral Daily  . furosemide  40 mg Intravenous BID  . gabapentin  400 mg Oral TID  . hydrALAZINE  50 mg Oral Q8H  . insulin aspart  0-15 Units Subcutaneous TID WC  . levothyroxine  175 mcg Oral QAC breakfast  . pramipexole  0.5 mg Oral BID  . senna  1 tablet Oral BID  . tamsulosin  0.4 mg Oral Daily  . torsemide  20 mg Oral Daily   Continuous Infusions:  PRN Meds: acetaminophen **OR** acetaminophen, HYDROmorphone (DILAUDID) injection, menthol-cetylpyridinium **OR** phenol, ondansetron **OR** ondansetron (ZOFRAN) IV, oxyCODONE   Vital Signs    Vitals:   04/11/18 1100 04/11/18 1124 04/11/18 1135 04/11/18 1200  BP: (!) 151/58   (!) 155/68  Pulse: 99   (!) 104  Resp: 18   (!) 21  Temp:  98.8 F (37.1 C) 99.6 F (37.6 C)   TempSrc:  Oral Oral   SpO2: 90%   94%  Weight:      Height:        Intake/Output Summary (Last 24 hours) at 04/11/2018 1230 Last data filed at 04/11/2018 1200 Gross per 24 hour  Intake 75 ml  Output 1905 ml  Net -1830 ml   Filed Weights   04/08/18 0614 04/08/18 1733 04/11/18 0305  Weight: 292 lb (132.5 kg) (!) 304 lb 0.2 oz (137.9 kg) (!) 306 lb 10.6 oz (139.1 kg)    Telemetry    Sinus rhythm to sinus tachycardia.  PVCs  - Personally Reviewed  ECG    n/a - Personally Reviewed  Physical Exam   VS:  BP (!) 155/68   Pulse (!) 104   Temp 99.6 F (37.6 C) (Oral)   Resp (!) 21   Ht 6\' 1"  (1.854 m)   Wt (!) 306 lb 10.6 oz (139.1 kg)   SpO2 94%   BMI 40.46 kg/m  , BMI Body mass index is 40.46 kg/m. GENERAL:  Well appearing HEENT: Pupils equal round and reactive, fundi not visualized, oral mucosa unremarkable NECK: Unable  to assess JVP.  Carotid upstroke brisk and symmetric, no bruits LUNGS:  Basilar crackles.  No wheezes or rhonchi. HEART:  RRR.  PMI not displaced or sustained,S1 and S2 within normal limits, no S3, no S4, no clicks, no rubs, no murmurs ABD:  Flat, positive bowel sounds normal in frequency in pitch, no bruits, no rebound, no guarding, no midline pulsatile mass, no hepatomegaly, no splenomegaly EXT:  2 plus pulses throughout, 2+ pitting edema to above the thighs bilaterally.  Unna boots in place.  No cyanosis no clubbing SKIN:  No rashes no nodules NEURO:  Cranial nerves II through XII grossly intact, motor grossly intact throughout [facility]:  Cognitively intact, oriented to person place and time   Labs    Chemistry Recent Labs  Lab 04/09/18 1456 04/10/18 0405 04/11/18 0407  NA 140 139 137  K 4.5 4.8 4.7  CL 101 101 100*  CO2 30 29 29   GLUCOSE 216* 171* 195*  BUN 46* 51* 57*  CREATININE 2.64* 2.72* 2.33*  CALCIUM 8.2* 8.2* 8.3*  GFRNONAA 22* 22* 26*  GFRAA 26* 25* 30*  ANIONGAP 9 9 8      Hematology Recent Labs  Lab 04/09/18 1456 04/11/18 0407  WBC 8.7 6.1  RBC 3.55* 3.34*  HGB 10.2* 9.6*  HCT 34.2* 31.7*  MCV 96.3 94.9  MCH 28.7 28.7  MCHC 29.8* 30.3  RDW 17.1* 16.9*  PLT 125* 137*    Cardiac EnzymesNo results for input(s): TROPONINI in the last 168 hours. No results for input(s): TROPIPOC in the last 168 hours.   BNP Recent Labs  Lab 04/10/18 0405  BNP 238.1*     DDimer No results for input(s): DDIMER in the last 168 hours.   Radiology    Dg Chest 1 View  Result Date: 04/09/2018 CLINICAL DATA:  Shortness of breath. EXAM: CHEST  1 VIEW COMPARISON:  04/01/2018 FINDINGS: Lung volumes are low. There is patchy opacity at both lung bases. This is likely atelectasis. Pneumonia is possible. No convincing pulmonary edema. No apparent pleural effusion and no pneumothorax. Cardiac silhouette is partly obscured but grossly normal in size. No mediastinal or hilar masses.  There stable changes from a thoracic and cervical spine fusion. IMPRESSION: 1. Bibasilar lung opacities most likely due to atelectasis accentuated by low lung volumes. Pneumonia is possible. No convincing pulmonary edema. Electronically Signed   By: Lajean Manes M.D.   On: 04/09/2018 15:01    Cardiac Studies   ECHO:  02/2017 - Left ventricle: The cavity size was normal. Wall thickness was increased in a pattern of mild LVH. Systolic function was vigorous. The estimated ejection fraction was in the range of 65% to 70%. Wall motion was normal; there were no regional wall motion abnormalities. Doppler parameters are consistent with pseudonormal left ventricular relaxation (grade 2 diastolic dysfunction). The E/A ratio is >1.5. The E/e&' ratio is >15, suggesting elevated LV filling pressure. - Left atrium: The atrium was mildly dilated. - Inferior vena cava: The vessel was normal in size. The respirophasic diameter changes were in the normal range (>= 50%), consistent with normal central venous pressure. Impressions: - Compared to a prior study in 2016, there is no significant change. LV filling pressure is elevated.   Patient Profile     72 y.o. male with chronic diastolic heart failure, OSA, hypertension, diabetes and mild pulmonary hypertension admitted 04/08/18 for back surgery and developed increased shortness of breath post operatively.  Assessment & Plan    # Acute hypercarbic respiratory failure: # Acute on chronic diastolic heart failure:   Patient became more lethargic and had minimal UOP on IV lasix 04/09/18.    Rapid response was called and he was found to have hypercarbic and hypoxic respiratory failure.  He required BiPaP.  No signficant pulmonary edema on CXR.  Thought to be due to OSA, inability to use CPAP prior to surgery and sedation from anesthesia.  He developed AKI after getting lasix and then was started on IV fluids.  Today he seems volume  overloaded and is again in respiratory distress requiring BiPAP.  Will get a CXR and add on a BNP. Will start lasix 40mg  IV bid.  He received one dose of torsemide this AM.  # CKD 3:  Acute on chronic renal failure.  Diuresis as above.   # Hypertension: BP is running high.  We will increase hydralazine to 100mg  q8h.  Losartan on hold in setting of acute on chronic renal failure.  # Tremor: Family notes he behaves this way in the setting of infection.  No WBC today. Will check CXR and u/a.   Time  spent: 45 minutes-Greater than 50% of this time was spent in counseling, explanation of diagnosis, planning of further management, and coordination of care.   For questions or updates, please contact [place] Please consult [email or web address] for contact info under Cardiology/STEMI.      Signed, Skeet Latch, MD  04/11/2018, 12:30 PM

## 2018-04-11 NOTE — Progress Notes (Addendum)
Neurosurgery Progress Note  No issues overnight.  Feels "foggy" this am Denies lower back pain or LE pain/weakness  EXAM:  BP (!) 143/59   Pulse 76   Temp 98 F (36.7 C) (Axillary)   Resp 13   Ht 6\' 1"  (1.854 m)   Wt (!) 139.1 kg (306 lb 10.6 oz)   SpO2 93%   BMI 40.46 kg/m   Awake, alert, oriented  Speech slow, but appropriate MAEW with good strength   PLAN Stable from NS standpoint this am Appreciate PCCM assistance with patient care PT/OT rec CIR. This is highly reccomended

## 2018-04-11 NOTE — Progress Notes (Signed)
Pt becoming increasingly somnolent, febrile, hypoxic, and having chills.  This RN informed Marni Griffon.  Urine and blood cultures ordered as well as antibiotics.  Irven Baltimore, RN

## 2018-04-11 NOTE — Progress Notes (Signed)
Pharmacy Antibiotic Note  Travis [name] is a 72 y.o. male admitted on 04/08/2018 with sepsis.  Pharmacy has been consulted for vancomycin/zosyn dosing. WBC WNL and patient currently afebrile. AKI- SCr 2.33 (trending down). Baseline: 1.2.   Plan: Vancomycin 2500 mg X1  Vancomycin 1500 mg every 24 hours  Zosyn 3.375 gm every 8 hours (EI) Monitor renal function, clinical s/sx of infection   Height: 6\' 1"  (185.4 cm) Weight: (!) 306 lb 10.6 oz (139.1 kg) IBW/kg (Calculated) : 79.9  Temp (24hrs), Avg:98.5 F (36.9 C), Min:97.9 F (36.6 C), Max:99.6 F (37.6 C)  Recent Labs  Lab 04/09/18 1456 04/10/18 0405 04/11/18 0407  WBC 8.7  --  6.1  CREATININE 2.64* 2.72* 2.33*    Estimated Creatinine Clearance: 41.4 mL/min (A) (by C-G formula based on SCr of 2.33 mg/dL (H)).    Allergies  Allergen Reactions  . Other Other (See Comments)    ANESTHESIA UNSPECIFIED  -- makes sick, must have antinausea medication in advance.  . Levofloxacin In D5w Itching    Possible infusion reaction 03/29/17. Tolerating oral Levaquin 03/30/17    Antimicrobials this admission: 4/14 Vanc>> 4/14 Zosyn>>   Thank you for allowing pharmacy to be a part of this patient's care.  Jimmy Footman, PharmD, BCPS PGY2 Infectious Diseases Pharmacy Resident Pager: [phone]  04/11/2018 1:10 PM

## 2018-04-11 NOTE — Progress Notes (Signed)
OT Cancellation Note  Patient Details Name: Travis [name] MRN: [id number] DOB: [birth year]   Cancelled Treatment:    Reason Eval/Treat Not Completed: Medical issues which prohibited therapy. Per RN; pt with increased lethargy, shivering, and returned to bipap. Will hold therapy at this time and follow up as time allows.  Binnie Kand M.S., OTR/L Pager: [phone]  04/11/2018, 11:56 AM

## 2018-04-12 ENCOUNTER — Encounter (HOSPITAL_COMMUNITY): Payer: Self-pay | Admitting: Neurological Surgery

## 2018-04-12 ENCOUNTER — Inpatient Hospital Stay (HOSPITAL_COMMUNITY): Payer: Medicare Other

## 2018-04-12 LAB — GLUCOSE, CAPILLARY
GLUCOSE-CAPILLARY: 195 mg/dL — AB (ref 65–99)
GLUCOSE-CAPILLARY: 186 mg/dL — AB (ref 65–99)
GLUCOSE-CAPILLARY: 203 mg/dL — AB (ref 65–99)
Glucose-Capillary: 135 mg/dL — ABNORMAL HIGH (ref 65–99)
Glucose-Capillary: 188 mg/dL — ABNORMAL HIGH (ref 65–99)

## 2018-04-12 LAB — URINE CULTURE: Culture: NO GROWTH

## 2018-04-12 LAB — BASIC METABOLIC PANEL
ANION GAP: 8 (ref 5–15)
BUN: 67 mg/dL — ABNORMAL HIGH (ref 6–20)
CALCIUM: 8.4 mg/dL — AB (ref 8.9–10.3)
CHLORIDE: 103 mmol/L (ref 101–111)
CO2: 29 mmol/L (ref 22–32)
Creatinine, Ser: 2.74 mg/dL — ABNORMAL HIGH (ref 0.61–1.24)
GFR calc Af Amer: 25 mL/min — ABNORMAL LOW (ref >60)
GFR calc non Af Amer: 21 mL/min — ABNORMAL LOW (ref >60)
Glucose, Bld: 157 mg/dL — ABNORMAL HIGH (ref 65–99)
Potassium: 4.9 mmol/L (ref 3.5–5.1)
SODIUM: 140 mmol/L (ref 135–145)

## 2018-04-12 LAB — PROCALCITONIN: PROCALCITONIN: 0.11 ng/mL

## 2018-04-12 MED ORDER — FUROSEMIDE 10 MG/ML IJ SOLN: 80.0000 mg | Freq: Two times a day (BID) | INTRAMUSCULAR | Status: DC

## 2018-04-12 MED ORDER — TORSEMIDE 20 MG PO TABS: 20.0000 mg | ORAL_TABLET | Freq: Every day | ORAL | Status: DC

## 2018-04-12 MED ORDER — FUROSEMIDE 10 MG/ML IJ SOLN
40.0000 mg | Freq: Once | INTRAMUSCULAR | Status: AC
  Administered 2018-04-12: 40 mg via INTRAVENOUS
  Filled 2018-04-12: qty 4

## 2018-04-12 MED ORDER — TORSEMIDE 20 MG PO TABS
20.0000 mg | ORAL_TABLET | Freq: Every day | ORAL | Status: DC
  Administered 2018-04-12 – 2018-04-13 (×2): 20 mg via ORAL
  Filled 2018-04-12 (×2): qty 1

## 2018-04-12 NOTE — Consult Note (Signed)
[place] Nurse wound consult note Reason for Consult:Venous insufficiency.  Patient with bilateral Unna's boots as a maintenance strategy.  Recently healed partial thickness wound on left pretibial area measuring 0.2cm round with no depth. Will continue therapy with once weekly Unna's Boot changes by ortho tech with an application of silicone foam over the recently healed area until it develops additional tensile strength. Wound type: Venous insufficiency Pressure Injury POA: NA Measurement:As noted above Wound bed:As noted above Drainage (amount, consistency, odor) None Periwound: Intact with hemosiderin staining. Dressing procedure/placement/frequency: I will continue the treatment of this gentleman's venous insufficiency, Clinical CEAP 5, with bilateral Unna's Boot applications.  We will continue to use a foam over the recently reepithelialized area on the left pretibial area until the return of tensile strength (3-4 weeks).  [place] nursing team will not follow, but will remain available to this patient, the nursing and medical teams.  Please re-consult if needed. Thanks, Maudie Flakes, MSN, RN, [place], Arther Abbott  Pager# [phone]

## 2018-04-12 NOTE — Care Management Note (Addendum)
Case Management Note  Patient Details  Name: Travis [name] MRN: [id number] Date of Birth: May 30, 1946  Subjective/Objective:  History of OSA, hypertension, diabetes and mild pulmonary hypertension, lumbar spinal stenosis;  Admitted s/p lumbar spinal fusion.               Action/Plan: Prior to admission patient lived at home with spouse. Home OIT:GPQDI chair, chair lift, CPAP. OT/PT recommended CIR;Supervision/Assistance - 24 hour .  NCM will continue to monitor discharge transition needs.    Expected Discharge Date:   To Be determined               Expected Discharge Plan:  [place]  In-House Referral:  Clinical Social Work  Discharge planning Services  CM Consult  Post Acute Care Choice:    Choice offered to:     Status of Service:  In process, will continue to follow  If discussed at Long Length of Stay Meetings, dates discussed:    Additional Comments:  Kristen Cardinal, RN  Nurse case [place] 04/12/2018, 12:39 PM

## 2018-04-12 NOTE — Progress Notes (Signed)
Neurosurgery Progress Note  Events noted from yesterday afternoon. Patient reports doing well this am Denies "foggy" feeling from yesterday Preoperative pain resolved  EXAM:  BP 105/65   Pulse 68   Temp 98.3 F (36.8 C) (Axillary)   Resp (!) 24   Ht 6\' 1"  (1.854 m)   Wt (!) 139.1 kg (306 lb 10.6 oz)   SpO2 93%   BMI 40.46 kg/m   Awake, alert, oriented  Speech fluent, appropriate  MAEW with good strength  PLAN Stable from NS standpoint this am Cr continues to rise. Appreciate PCCM assistance with patient care PT/OT rec CIR. Agree this is in patient's best interest.

## 2018-04-12 NOTE — Progress Notes (Signed)
Physical Therapy Treatment Patient Details Name: Travis [name] MRN: [id number] DOB: 05/28/1946 Today's Date: 04/12/2018    History of Present Illness Pt is a 72 yo male admitted for decompressive lumbar laminectomy with hemifacetectomy and foraminotomy at L4-5 [place].  Pt also had C7-T1 medial facetectomry and foraminotomy.  Pt with PMH of previous lumbar surgeries, COPD, CKD, OSA, CHF, DDD. Pt not on home O2 but on 3L today.      PT Comments    Patient progressing slowly, but able to achieve up OOB this session with Stedy and sat EOB for about 10 minutes with support and cues for breathing.  Continues to be appropriate for CIR level rehab due to complexity of musculoskeletal, neurological and cardiopulmonary system involvement in addition to prolonged hospitalization with significant deconditioning.   PT to follow acutely.  Follow Up Recommendations  CIR     Equipment Recommendations  None recommended by PT    Recommendations for Other Services       Precautions / Restrictions Precautions Precautions: Fall;Back;Cervical Precaution Comments: Pt educated concerning back precautions and cervical precautions. He was unable to recall at onset of session.  Restrictions Weight Bearing Restrictions: No    Mobility  Bed Mobility Overal bed mobility: Needs Assistance Bed Mobility: Sit to Supine Rolling: Mod assist;+2 for physical assistance Sidelying to sit: +2 for physical assistance;Max assist       General bed mobility comments: Max assist with significant cueing for adhering to back precautions and to avoid twisting, performed as lowering HOB due to pt intolerance to lying flat  Transfers Overall transfer level: Needs assistance   Transfers: Sit to/from Stand Sit to Stand: Max assist;+2 physical assistance;From elevated surface Stand pivot transfers: Max assist;+2 physical assistance       General transfer comment: Max assist +2 for lifting to power up with elevated height  of bed to upright posture this session. Used Stedy to pivot to recliner with +2 A for safety   Ambulation/Gait                 Stairs             Wheelchair Mobility    Modified Rankin (Stroke Patients Only)       Balance Overall balance assessment: Needs assistance Sitting-balance support: Bilateral upper extremity supported;Feet supported Sitting balance-Leahy Scale: Poor Sitting balance - Comments: needs bilateral UE support or back support to lean on  Postural control: Posterior lean Standing balance support: Bilateral upper extremity supported;During functional activity Standing balance-Leahy Scale: Zero Standing balance comment: Using Stedy; unable to come fully upright                            Cognition Arousal/Alertness: Awake/alert Behavior During Therapy: WFL for tasks assessed/performed Overall Cognitive Status: Within Functional Limits for tasks assessed                                 General Comments: Functional cognition this session.       Exercises      General Comments General comments (skin integrity, edema, etc.): 6L O2 via [place] with desat to 85%, improved to 92% with cues      Pertinent Vitals/Pain Pain Assessment: Faces Faces Pain Scale: Hurts little more Pain Location: Lower back Pain Descriptors / Indicators: Grimacing;Guarding;Discomfort Pain Intervention(s): Limited activity within patient's tolerance;Monitored during session;Repositioned    Home Living  Prior Function            PT Goals (current goals can now be found in the care plan section) Acute Rehab PT Goals Patient Stated Goal: to walk again Progress towards PT goals: Progressing toward goals(slowly)    Frequency    Min 5X/week      PT Plan Current plan remains appropriate    Co-evaluation PT/OT/SLP Co-Evaluation/Treatment: Yes Reason for Co-Treatment: Complexity of the patient's impairments  (multi-system involvement);For patient/therapist safety;To address functional/ADL transfers PT goals addressed during session: Mobility/safety with mobility;Balance OT goals addressed during session: ADL's and self-care      AM-PAC PT "6 Clicks" Daily Activity  Outcome Measure  Difficulty turning over in bed (including adjusting bedclothes, sheets and blankets)?: Unable Difficulty moving from lying on back to sitting on the side of the bed? : Unable Difficulty sitting down on and standing up from a chair with arms (e.g., wheelchair, bedside commode, etc,.)?: Unable Help needed moving to and from a bed to chair (including a wheelchair)?: Total Help needed walking in hospital room?: Total Help needed climbing 3-5 steps with a railing? : Total 6 Click Score: 6    End of Session Equipment Utilized During Treatment: Gait belt;Oxygen Activity Tolerance: Patient limited by fatigue Patient left: in chair;with call bell/phone within reach Nurse Communication: Mobility status;Need for lift equipment PT Visit Diagnosis: Unsteadiness on feet (R26.81);Muscle weakness (generalized) (M62.81);Difficulty in walking, not elsewhere classified (R26.2)     Time: 1325-1400 PT Time Calculation (min) (ACUTE ONLY): 35 min  Charges:  $Therapeutic Activity: 8-22 mins                    G CodesMagda [name], [place] [phone] 04/12/2018    Travis [name] 04/12/2018, 4:50 PM

## 2018-04-12 NOTE — Progress Notes (Signed)
Pt placed on Bipap, pt tolerating well at this time

## 2018-04-12 NOTE — Progress Notes (Signed)
SVV 9-10 Supports that he has adequate volume Plan Resume diuresis

## 2018-04-12 NOTE — Progress Notes (Signed)
Rehab admissions - I have authorization for acute inpatient rehab admission.  I spoke with Marni Griffon, PA with critical care.  Patient is not medically ready today for CIR.  I will follow for medical readiness and then plan CIR admission once patient is medically ready.  Call me for questions.  #[phone]

## 2018-04-12 NOTE — Progress Notes (Addendum)
PULMONARY / CRITICAL CARE MEDICINE   Name: Travis [name] MRN: [id number] DOB: 17-Jan-1946    ADMISSION DATE:  04/08/2018 CONSULTATION DATE:  04/09/18  REFERRING MD:  Hoyt Koch  CHIEF COMPLAINT:  Acute resp , acidosis, altered mental status  HISTORY OF PRESENT ILLNESS:    72 year old male patient with history of chronic diastolic heart failure, obstructive sleep apnea, mild pulmonary artery hypertension.  Status post decompressive lumbar laminectomy on 4/12.  Critical care asked to see in consult postoperatively due to altered sensorium and progressive hypercarbic respiratory failure  Subjective He feels better this morning   Objective VITAL SIGNS: Blood Pressure 112/64   Pulse (Abnormal) 59   Temperature 97.6 F (36.4 C) (Axillary)   Respiration 20   Height 6\' 1"  (1.854 m)   Weight (Abnormal) 306 lb 10.6 oz (139.1 kg)   Oxygen Saturation 96%   Body Mass Index 40.46 kg/m      VENTILATOR SETTINGS: FiO2 (%):  [40 %-60 %] 40 %  INTAKE / OUTPUT:  Intake/Output Summary (Last 24 hours) at 04/12/2018 3818 Last data filed at 04/12/2018 0900 Gross per 24 hour  Intake 650 ml  Output 826 ml  Net -176 ml     PHYSICAL EXAMINATION:  General: This is a 72 year old white male he is currently on BiPAP, he is awake, briskly responsive no distress HEENT normocephalic atraumatic BiPAP mask in place Pulmonary: Diminished bases some scattered rhonchi Cardiac: Regular rate and rhythm Extremities/musculoskeletal: Warm, dry, diffuse anasarca.  Wrist cap refill Abdomen: Soft, nontender, no organomegaly positive bowel sounds Neuro: Awake, follows commands  LABS:  BMET Recent Labs  Lab 04/10/18 0405 04/11/18 0407 04/12/18 0502  NA 139 137 140  K 4.8 4.7 4.9  CL 101 100* 103  CO2 29 29 29   BUN 51* 57* 67*  CREATININE 2.72* 2.33* 2.74*  GLUCOSE 171* 195* 157*    Electrolytes Recent Labs  Lab 04/10/18 0405 04/11/18 0407 04/12/18 0502  CALCIUM 8.2* 8.3* 8.4*  MG 2.2  --    --   PHOS 5.3*  --   --     CBC Recent Labs  Lab 04/09/18 1456 04/11/18 0407 04/11/18 1406  WBC 8.7 6.1 9.9  HGB 10.2* 9.6* 10.6*  HCT 34.2* 31.7* 35.0*  PLT 125* 137* 153    Coag's No results for input(s): APTT, INR in the last 168 hours.  Sepsis Markers Recent Labs  Lab 04/09/18 1839 04/11/18 1406 04/12/18 0502  LATICACIDVEN  --  0.9  --   PROCALCITON <0.10 <0.10 0.11    ABG Recent Labs  Lab 04/09/18 2132 04/10/18 0300 04/11/18 0359  PHART 7.267* 7.290* 7.301*  PCO2ART 69.3* 64.7* 65.5*  PO2ART 85.6 96.3 69.2*    Liver Enzymes No results for input(s): AST, ALT, ALKPHOS, BILITOT, ALBUMIN in the last 168 hours.  Cardiac Enzymes No results for input(s): TROPONINI, PROBNP in the last 168 hours.  Glucose Recent Labs  Lab 04/10/18 2129 04/11/18 0820 04/11/18 1139 04/11/18 1652 04/11/18 2327 04/12/18 0753  GLUCAP 212* 142* 175* 217* 191* 135*    Imaging Dg Chest Port 1 View  Result Date: 04/12/2018 CLINICAL DATA:  Atelectasis. EXAM: PORTABLE CHEST 1 VIEW COMPARISON:  One-view chest x-ray 04/11/2018 FINDINGS: Heart is enlarged. Size is exaggerated by low lung volumes. Mild pulmonary vascular congestion is stable. Bibasilar airspace disease is unchanged. No new airspace disease is present. A right pleural effusion is suspected. IMPRESSION: 1. Stable low lung volumes and bibasilar airspace disease, likely atelectasis. Electronically Signed   By: Harrell Gave  Mattern M.D.   On: 04/12/2018 07:21   Dg Chest Port 1 View  Result Date: 04/11/2018 CLINICAL DATA:  Shortness of breath EXAM: PORTABLE CHEST 1 VIEW COMPARISON:  April 09, 2018 FINDINGS: There is patchy airspace consolidation in both lung bases with small pleural effusions bilaterally. There is no appreciable interstitial edema. There is cardiomegaly with pulmonary venous hypertension. There is aortic atherosclerosis. There is postoperative change in the lower cervical and lower thoracic regions.  IMPRESSION: There is a degree of pulmonary vascular congestion with small pleural effusions bilaterally. There is no appreciable interstitial edema. There is, however, bilateral airspace consolidation, likely pneumonia. There is aortic atherosclerosis. Areas of postoperative change noted. Aortic Atherosclerosis (ICD10-I70.0). Electronically Signed   By: Lowella Grip III M.D.   On: 04/11/2018 16:17     cultures UC 4/14>>> Blood 4/14>>>  abx vanc 4/14>>> stop 4/15 Zosyn 4/14>>>  ASSESSMENT / PLAN:  Acute encephalopathy in the setting of hypercarbia and residual anesthesia. Now possibly sepsis and fever related  Plan Continue supportive care Limit sedation  Acute on chronic hypercarbic respiratory failure in the setting of residual anesthesia, atelectasis, and hypoventilation. History of obstructive sleep apnea +/- obesity hypoventilation syndrome -Baseline CO2 calculates around 50 Portable chest x-ray obtained on 4/15: This is personally reviewed and demonstrates low volume, probable atelectasis, cannot exclude element of volume excess. Plan Continue BiPAP mandatory at at bedtime and as needed during the day Wean oxygen Incentive spirometry Mobilize  SIRS w/ rigors.  -Pro calcitonin is negative, no significant leukocytosis, culture data remains pending Plan Discontinue vancomycin, would have expected positive culture by now Continue Zosyn day #2 for now  Diastolic heart failure; now hypertensive Recent ECHO showed a LVEF of 65-70%. There was evidence of grade 2 diastolic dysfxn Plan Holding antihypertensives for now Continuing telemetry See discussion below regarding AKI  Acute kidney injury Underlying chronic kidney disease, stage III.  Appears as though baseline creatinine around 1.32.  His creatinine increased to greater than 2.7 following 2 doses of Lasix, improved after holding diuretic, and now once again above 2.7 following the administration of diuretics.  He  appears total volume overloaded, however his intravascular volume is unclear Plan Discontinuing vancomycin given concern about worsening renal function We will place the clear site monitoring device, to evaluate stroke volume variation as well as fluid responsiveness.  This may give [place] some more insight to appropriateness for diuresis versus IV fluids Strict intake output Renal dose medications  Anemia  -Hemoglobin holding stable Plan PAS to lower extremities I will discuss with surgery regarding subcu heparin  Diabetes Plan Continuing sliding scale insulin  Hypothyroidism  plan Continue Synthroid  Keep in ICU DVT prophylaxis: scd SUP: na Diet: reg Activity: OOB Disposition : ICU   My cct 40 minutes  Erick Colace ACNP-BC [place] Pager # [phone] OR # [phone] if no answer 04/12/2018, 9:07 AM   Attending Note:  I have examined patient, reviewed labs, studies and notes. I have discussed the case with Jerrye Bushy, and I agree with the data and plans as amended above.  72 year old obese man with chronic diastolic CHF, obstructive sleep apnea, mild secondary pulmonary hypertension.  He underwent a decompressive lumbar laminectomy on 4/12.  Postop course was complicated by hypercapnia and altered mental status, as well as is acute renal insufficiency.  He was progressing but then developed new fevers and altered mental status, suspected in the setting of urinary tract infection, culture now negative.  He has required BiPAP support.  Vitals:   04/12/18 1100 04/12/18 1130 04/12/18 1200 04/12/18 1300  BP: 120/61  109/68 122/73  Pulse: 68  65 67  Resp: (!) 22  17 14   Temp:  97.7 F (36.5 C)    TempSrc:  Oral    SpO2: (!) 88%  95% 95%  Weight:      Height:       On evaluation today he is an obese man, comfortable.  He is getting up to work with physical therapy.  Scattered bilateral rhonchi with decreased breath sounds at both bases.  He has significant  total body volume overload.  Otherwise normal exam.  Labs significant for persistent acute renal insufficiency, serum creatinine 2.74. Noninvasive stroke volume assessment estimated at 9-10  We will plan to continue gentle diuresis as he can tolerate.  Continue BiPAP at night and if needed during the day.  In absence of a positive culture and with a negative pro calcitonin we will stop vancomycin today, continue Zosyn for 1 more day and potentially discontinue on 4/16.  Baltazar Apo, MD, PhD 04/12/2018, 2:30 PM [facility] [phone] or if no answer [phone]

## 2018-04-12 NOTE — Progress Notes (Addendum)
Progress Note  Patient Name: Travis [name] Date of Encounter: 04/12/2018  Primary Cardiologist: Mertie Moores, MD   Subjective   Reports he does not remember much of the past several days. Wife at bedside states he is much improved from yesterday with regards to breathing and LE edema. Patient feels his breathing is comfortable now. Denies chest pain or palpitations.   Inpatient Medications    Scheduled Meds: . escitalopram  10 mg Oral Daily  . fesoterodine  8 mg Oral Daily  . gabapentin  400 mg Oral TID  . insulin aspart  0-15 Units Subcutaneous TID WC  . levothyroxine  175 mcg Oral QAC breakfast  . pramipexole  0.5 mg Oral BID  . senna  1 tablet Oral BID  . tamsulosin  0.4 mg Oral Daily   Continuous Infusions: . piperacillin-tazobactam (ZOSYN)  IV 3.375 g (04/12/18 0541)   PRN Meds: acetaminophen **OR** acetaminophen, HYDROmorphone (DILAUDID) injection, menthol-cetylpyridinium **OR** phenol, ondansetron **OR** ondansetron (ZOFRAN) IV, oxyCODONE   Vital Signs    Vitals:   04/12/18 0800 04/12/18 0805 04/12/18 0900 04/12/18 0917  BP: 112/64 112/64 (!) 117/59   Pulse: 61 (!) 59 61   Resp: (!) 23 20 17    Temp:      TempSrc:      SpO2: 96% 96% 96% 92%  Weight:      Height:        Intake/Output Summary (Last 24 hours) at 04/12/2018 1135 Last data filed at 04/12/2018 1000 Gross per 24 hour  Intake 650 ml  Output 816 ml  Net -166 ml   Filed Weights   04/08/18 0614 04/08/18 1733 04/11/18 0305  Weight: 292 lb (132.5 kg) (!) 304 lb 0.2 oz (137.9 kg) (!) 306 lb 10.6 oz (139.1 kg)    Telemetry    Sinus rhythm with PVCs - Personally Reviewed  Physical Exam   GEN: sitting upright in bed in no acute distress.   Neck: No JVD appreciated, no carotid bruits Cardiac: RRR, no murmurs, rubs, or gallops.  Respiratory: decreased breath sounds at bases b/l GI: NABS, distended, nontender MS: 1-2+ LE edema L>R; No deformity. Neuro:  Nonfocal, moving all extremities  spontaneously Psych: Normal affect   Labs    Chemistry Recent Labs  Lab 04/10/18 0405 04/11/18 0407 04/12/18 0502  NA 139 137 140  K 4.8 4.7 4.9  CL 101 100* 103  CO2 29 29 29   GLUCOSE 171* 195* 157*  BUN 51* 57* 67*  CREATININE 2.72* 2.33* 2.74*  CALCIUM 8.2* 8.3* 8.4*  GFRNONAA 22* 26* 21*  GFRAA 25* 30* 25*  ANIONGAP 9 8 8      Hematology Recent Labs  Lab 04/09/18 1456 04/11/18 0407 04/11/18 1406  WBC 8.7 6.1 9.9  RBC 3.55* 3.34* 3.66*  HGB 10.2* 9.6* 10.6*  HCT 34.2* 31.7* 35.0*  MCV 96.3 94.9 95.6  MCH 28.7 28.7 29.0  MCHC 29.8* 30.3 30.3  RDW 17.1* 16.9* 16.8*  PLT 125* 137* 153    Cardiac EnzymesNo results for input(s): TROPONINI in the last 168 hours. No results for input(s): TROPIPOC in the last 168 hours.   BNP Recent Labs  Lab 04/10/18 0405 04/11/18 0407  BNP 238.1* 120.9*     DDimer No results for input(s): DDIMER in the last 168 hours.   Radiology    Dg Chest Port 1 View  Result Date: 04/12/2018 CLINICAL DATA:  Atelectasis. EXAM: PORTABLE CHEST 1 VIEW COMPARISON:  One-view chest x-ray 04/11/2018 FINDINGS: Heart is enlarged. Size  is exaggerated by low lung volumes. Mild pulmonary vascular congestion is stable. Bibasilar airspace disease is unchanged. No new airspace disease is present. A right pleural effusion is suspected. IMPRESSION: 1. Stable low lung volumes and bibasilar airspace disease, likely atelectasis. Electronically Signed   By: San Morelle M.D.   On: 04/12/2018 07:21   Dg Chest Port 1 View  Result Date: 04/11/2018 CLINICAL DATA:  Shortness of breath EXAM: PORTABLE CHEST 1 VIEW COMPARISON:  April 09, 2018 FINDINGS: There is patchy airspace consolidation in both lung bases with small pleural effusions bilaterally. There is no appreciable interstitial edema. There is cardiomegaly with pulmonary venous hypertension. There is aortic atherosclerosis. There is postoperative change in the lower cervical and lower thoracic regions.  IMPRESSION: There is a degree of pulmonary vascular congestion with small pleural effusions bilaterally. There is no appreciable interstitial edema. There is, however, bilateral airspace consolidation, likely pneumonia. There is aortic atherosclerosis. Areas of postoperative change noted. Aortic Atherosclerosis (ICD10-I70.0). Electronically Signed   By: Lowella Grip III M.D.   On: 04/11/2018 16:17    Cardiac Studies   Echocardiogram 03/06/18: Study Conclusions  - Left ventricle: The cavity size was normal. Wall thickness was   increased in a pattern of mild LVH. Systolic function was   vigorous. The estimated ejection fraction was in the range of 65%   to 70%. Wall motion was normal; there were no regional wall   motion abnormalities. Doppler parameters are consistent with   pseudonormal left ventricular relaxation (grade 2 diastolic   dysfunction). The E/A ratio is >1.5. The E/e&' ratio is >15,   suggesting elevated LV filling pressure. - Left atrium: The atrium was mildly dilated. - Inferior vena cava: The vessel was normal in size. The   respirophasic diameter changes were in the normal range (>= 50%),   consistent with normal central venous pressure.  Impressions:  - Compared to a prior study in 2016, there is no significant   change. LV filling pressure is elevated.  Patient Profile     72 y.o. male with chronic diastolic heart failure, OSA, hypertension, diabetes and mild pulmonary hypertension admitted 04/08/18 for back surgery and developed increased shortness of breath post operatively.   Assessment & Plan    1. Acute respiratory failure: multifactorial in the setting of acute hypercarbic respiratory failure and acute on chronic diastolic CHF requiring BiPAP therapy. Now improved on 6L O2 via [place] after receiving IV lasix. Minimal UOP with -339mL in the past 24 hours; net +189 despite IV lasix 40mg  x2 yesterday. CXR today with continued mild pulmonary vascular congestion.  Cr 2.33> 2.74 today (baseline 1.3). He continues to have decreased breath sounds on exam and 1-2+ LE edema L>R.   - May need more aggressive diuresis.   2. HTN: BP improved with increase in hydralazine yesterday. Losartan on hold for acute on chronic renal failure - Continue current regimen  3. Acute on chronic renal insufficiency stage III: Cr 2.33>2.74 today (baseline 1.3). - Continue to monitor closely with diuresis - Continue to renally dose medications  4. Rigors yesterday c/f infection: family reports similar symptoms with previous UTIs. Febrile to 100.9 yesterday. No leukocytosis. Procal negative. UCx negative. BCx pending. Vanc discontinued today. Still on zosyn. Unclear source.  - Continue management per primary team.   For questions or updates, please contact [place] Please consult [email or web address] for contact info under Cardiology/STEMI.      Signed, Abigail Butts, [name]  04/12/2018, 11:35 AM   [phone]  History and all data above reviewed.  Patient examined.  I agree with the findings as above.   He denies any SOB.  He has back pain as expected.  He denies any chest pain.  The patient exam reveals COR:RRR  ,  Lungs: Decreased breath sounds  ,  Abd: Positive bowel sounds, no rebound no guarding, Ext Moderate edema  .  All available labs, radiology testing, previous records reviewed. Agree with documented assessment and plan. Acute on chronic diastolic HF:  I agree with diuresis and close follow up of his creat.  He was given both IV diuresis and PO Demadex by CCM.  We will defer to their management.    Jeneen Rinks Kaytee Taliercio  12:56 PM  04/12/2018

## 2018-04-12 NOTE — Progress Notes (Addendum)
Occupational Therapy Treatment Patient Details Name: Travis [name] MRN: [id number] DOB: 1946/07/13 Today's Date: 04/12/2018    History of present illness Pt is a 72 yo male admitted for decompressive lumbar laminectomy with hemifacetectomy and foraminotomy at L4-5 [place].  Pt also had C7-T1 medial facetectomry and foraminotomy.  Pt with PMH of previous lumbar surgeries, COPD, CKD, OSA, CHF, DDD. Pt not on home O2 but on 3L today.     OT comments  Pt demonstrating progress toward OT goals this session. He was better able to follow commands and problem solve this session. Pt able to complete simulated toilet transfer with max assist +2 with Stedy this session. He was able to tolerate ADL participation seated at EOB with max assist for balance this session for approximately 10 minutes this session. Pt with O2 desaturation briefly to 85% but rebounds quickly to 92% with education concerning pursed lip breathing techniques. Pt remains a good candidate for CIR level therapies post-acute D/C and will continue to follow while admitted.   Follow Up Recommendations  CIR;Supervision/Assistance - 24 hour    Equipment Recommendations  Wheelchair (measurements OT);Wheelchair cushion (measurements OT);Other (comment)(needs new w/c)    Recommendations for Other Services Rehab consult    Precautions / Restrictions Precautions Precautions: Fall;Back;Cervical Precaution Comments: Pt educated concerning back precautions and cervical precautions. He was unable to recall at onset of session.  Restrictions Weight Bearing Restrictions: No       Mobility Bed Mobility Overal bed mobility: Needs Assistance Bed Mobility: Sit to Supine Rolling: Mod assist;+2 for physical assistance Sidelying to sit: +2 for physical assistance;Max assist       General bed mobility comments: Max assist with significant cueing for adhering to back precautions and to avoid twisting.   Transfers Overall transfer level: Needs  assistance Equipment used: Stedy Transfers: Sit to/from Stand Sit to Stand: Max assist;+2 physical assistance         General transfer comment: Max assist +2 for lifting to power up to upright posture this session.     Balance Overall balance assessment: Needs assistance Sitting-balance support: Bilateral upper extremity supported;Feet supported Sitting balance-Leahy Scale: Poor Sitting balance comment: requiring max assist at EOB      Standing balance support: Bilateral upper extremity supported;During functional activity Standing balance-Leahy Scale: Zero Standing balance comment: Using Stedy                           ADL either performed or assessed with clinical judgement   ADL Overall ADL's : Needs assistance/impaired Eating/Feeding: Supervision/ safety;Sitting;Set up                       Toilet Transfer: +2 for physical assistance;Maximal assistance(with Stedy) Armed forces technical officer Details (indicate cue type and reason): Using Stedy to simulate from bed to chair.  Toileting- Clothing Manipulation and Hygiene: Bed level       Functional mobility during ADLs: Maximal assistance;+2 for physical assistance(Stedy) General ADL Comments: Pt with improved ability to engage in ADL participation this session.      Vision   Vision Assessment?: (no functional deficits this session)   Perception     Praxis      Cognition Arousal/Alertness: Awake/alert Behavior During Therapy: WFL for tasks assessed/performed Overall Cognitive Status: Within Functional Limits for tasks assessed  General Comments: Functional cognition this session.         Exercises     Shoulder Instructions       General Comments Pt on 6L O2 via  this session with desaturation to 85% with improvement to 92% with education concerning pursed lip breathing strategies.     Pertinent Vitals/ Pain       Pain Assessment: Faces Faces  Pain Scale: Hurts a little bit Pain Location: Lower back Pain Descriptors / Indicators: Grimacing;Guarding;Discomfort Pain Intervention(s): Limited activity within patient's tolerance;Monitored during session;Repositioned  Home Living                                          Prior Functioning/Environment              Frequency  Min 3X/week        Progress Toward Goals  OT Goals(current goals can now be found in the care plan section)  Progress towards OT goals: Progressing toward goals  Acute Rehab OT Goals Patient Stated Goal: to walk again OT Goal Formulation: With patient/family Time For Goal Achievement: 04/23/18 Potential to Achieve Goals: Good  Plan Discharge plan remains appropriate    Co-evaluation    PT/OT/SLP Co-Evaluation/Treatment: Yes Reason for Co-Treatment: Complexity of the patient's impairments (multi-system involvement);Necessary to address cognition/behavior during functional activity;For patient/therapist safety;To address functional/ADL transfers   OT goals addressed during session: ADL's and self-care      AM-PAC PT "6 Clicks" Daily Activity     Outcome Measure   Help from another person eating meals?: A Little Help from another person taking care of personal grooming?: A Little Help from another person toileting, which includes using toliet, bedpan, or urinal?: A Lot Help from another person bathing (including washing, rinsing, drying)?: A Lot Help from another person to put on and taking off regular upper body clothing?: Total Help from another person to put on and taking off regular lower body clothing?: Total 6 Click Score: 12    End of Session Equipment Utilized During Treatment: Rolling walker;Oxygen  OT Visit Diagnosis: Unsteadiness on feet (R26.81);Other abnormalities of gait and mobility (R26.89);Muscle weakness (generalized) (M62.81);Other symptoms and signs involving the nervous system (R29.898)   Activity  Tolerance Patient limited by lethargy   Patient Left in chair;with call bell/phone within reach;with family/visitor present   Nurse Communication Mobility status;Need for lift equipment;Precautions        Time: 1325-1400 OT Time Calculation (min): 35 min  Charges: OT General Charges $OT Visit: 1 Visit OT Treatments $Self Care/Home Management : 8-22 mins  Norman Herrlich, MS OTR/L  Pager: [place] 04/12/2018, 2:57 PM

## 2018-04-12 NOTE — Progress Notes (Signed)
Orthopedic Tech Progress Note Patient Details:  WISE FEES 1946-05-05 [id number]  Ortho Devices Type of Ortho Device: Haematologist Ortho Device/Splint Location: Bilateral unna boots Ortho Device/Splint Interventions: Application   Post Interventions Patient Tolerated: Well Instructions Provided: Care of device   [name] 04/12/2018, 3:30 PM

## 2018-04-13 LAB — GLUCOSE, CAPILLARY
GLUCOSE-CAPILLARY: 197 mg/dL — AB (ref 65–99)
GLUCOSE-CAPILLARY: 170 mg/dL — AB (ref 65–99)
Glucose-Capillary: 224 mg/dL — ABNORMAL HIGH (ref 65–99)
Glucose-Capillary: 203 mg/dL — ABNORMAL HIGH (ref 65–99)

## 2018-04-13 LAB — BASIC METABOLIC PANEL
Anion gap: 11 (ref 5–15)
BUN: 67 mg/dL — ABNORMAL HIGH (ref 6–20)
CALCIUM: 8.5 mg/dL — AB (ref 8.9–10.3)
CHLORIDE: 102 mmol/L (ref 101–111)
CO2: 27 mmol/L (ref 22–32)
CREATININE: 2.28 mg/dL — AB (ref 0.61–1.24)
GFR calc Af Amer: 31 mL/min — ABNORMAL LOW (ref >60)
GFR calc non Af Amer: 27 mL/min — ABNORMAL LOW (ref >60)
GLUCOSE: 185 mg/dL — AB (ref 65–99)
Potassium: 4.4 mmol/L (ref 3.5–5.1)
Sodium: 140 mmol/L (ref 135–145)

## 2018-04-13 LAB — PROCALCITONIN: Procalcitonin: <0.1 ng/mL

## 2018-04-13 MED ORDER — POLYETHYLENE GLYCOL 3350 17 G PO PACK
17.0000 g | PACK | Freq: Every day | ORAL | Status: DC
  Administered 2018-04-13 – 2018-04-16 (×3): 17 g via ORAL
  Filled 2018-04-13 (×3): qty 1

## 2018-04-13 MED ORDER — CEFDINIR 300 MG PO CAPS
300.0000 mg | ORAL_CAPSULE | Freq: Two times a day (BID) | ORAL | Status: AC
  Administered 2018-04-13 – 2018-04-14 (×3): 300 mg via ORAL
  Filled 2018-04-13 (×3): qty 1

## 2018-04-13 MED ORDER — FUROSEMIDE 10 MG/ML IJ SOLN
80.0000 mg | Freq: Once | INTRAMUSCULAR | Status: AC
  Administered 2018-04-13: 80 mg via INTRAVENOUS
  Filled 2018-04-13: qty 8

## 2018-04-13 NOTE — Plan of Care (Signed)
  Problem: Elimination: Goal: Will not experience complications related to bowel motility 04/13/2018 2256 by Levonne Hubert, RN Outcome: Progressing Note:  LBM 4/11 per patient, attempted to have BM tonight but was unsuccessful. Senokot given per order, started on miralax today. 04/13/2018 2256 by Levonne Hubert, RN Reactivated

## 2018-04-13 NOTE — Progress Notes (Signed)
PULMONARY / CRITICAL CARE MEDICINE   Name: Travis [name] MRN: [id number] DOB: April 03, 1946    ADMISSION DATE:  04/08/2018 CONSULTATION DATE:  04/09/18  REFERRING MD:  Hoyt Koch  CHIEF COMPLAINT:  Acute resp , acidosis, altered mental status  HISTORY OF PRESENT ILLNESS:    72 year old male patient with history of chronic diastolic heart failure, obstructive sleep apnea, mild pulmonary artery hypertension.  Status post decompressive lumbar laminectomy on 4/12.  Critical care asked to see in consult postoperatively due to altered sensorium and progressive hypercarbic respiratory failure  Subjective Feels better Still desaturating at night   Objective VITAL SIGNS: Blood Pressure 109/86   Pulse 81   Temperature 98.6 F (37 C) (Oral)   Respiration (Abnormal) 25   Height 6\' 1"  (1.854 m)   Weight (Abnormal) 306 lb 10.6 oz (139.1 kg)   Oxygen Saturation (Abnormal) 87%   Body Mass Index 40.46 kg/m      VENTILATOR SETTINGS:    INTAKE / OUTPUT:  Intake/Output Summary (Last 24 hours) at 04/13/2018 1011 Last data filed at 04/13/2018 0900 Gross per 24 hour  Intake 1170 ml  Output 2289 ml  Net -1119 ml     PHYSICAL EXAMINATION: General: 72 year old male resting in bed no acute distress HEENT normocephalic atraumatic no jugular venous distention mucous membranes moist Pulmonary: Diminished bases no accessory use Cardiac: Regular rate and rhythm Abdomen: Soft nontender Extremities: Brisk cap refill, lower extremity edema chronic.  Warm, strong pulses. Neuro: Alert oriented no focal deficits.  LABS:  BMET Recent Labs  Lab 04/11/18 0407 04/12/18 0502 04/13/18 0453  NA 137 140 140  K 4.7 4.9 4.4  CL 100* 103 102  CO2 29 29 27   BUN 57* 67* 67*  CREATININE 2.33* 2.74* 2.28*  GLUCOSE 195* 157* 185*    Electrolytes Recent Labs  Lab 04/10/18 0405 04/11/18 0407 04/12/18 0502 04/13/18 0453  CALCIUM 8.2* 8.3* 8.4* 8.5*  MG 2.2  --   --   --   PHOS 5.3*  --   --   --      CBC Recent Labs  Lab 04/09/18 1456 04/11/18 0407 04/11/18 1406  WBC 8.7 6.1 9.9  HGB 10.2* 9.6* 10.6*  HCT 34.2* 31.7* 35.0*  PLT 125* 137* 153    Coag's No results for input(s): APTT, INR in the last 168 hours.  Sepsis Markers Recent Labs  Lab 04/11/18 1406 04/12/18 0502 04/13/18 0453  LATICACIDVEN 0.9  --   --   PROCALCITON <0.10 0.11 <0.10    ABG Recent Labs  Lab 04/09/18 2132 04/10/18 0300 04/11/18 0359  PHART 7.267* 7.290* 7.301*  PCO2ART 69.3* 64.7* 65.5*  PO2ART 85.6 96.3 69.2*    Liver Enzymes No results for input(s): AST, ALT, ALKPHOS, BILITOT, ALBUMIN in the last 168 hours.  Cardiac Enzymes No results for input(s): TROPONINI, PROBNP in the last 168 hours.  Glucose Recent Labs  Lab 04/12/18 0753 04/12/18 1133 04/12/18 1514 04/12/18 1700 04/12/18 2220 04/13/18 0751  GLUCAP 135* 195* 188* 186* 203* 224*    Imaging No results found.   cultures UC 4/14>>> negative Blood 4/14>>>  abx vanc 4/14>>> stop 4/15 Zosyn 4/14>>> -4/16 Ceftin ear 4/16  ASSESSMENT / PLAN:  Acute encephalopathy in the setting of hypercarbia and residual anesthesia. Now possibly sepsis and fever related;  This is now better Plan Supportive care  Acute on chronic hypercarbic respiratory failure in the setting of residual anesthesia, atelectasis, and hypoventilation. History of obstructive sleep apnea +/- obesity hypoventilation syndrome -Baseline CO2  calculates around 50 Portable chest x-ray obtained on 4/15: This is personally reviewed and demonstrates low volume, probable atelectasis, cannot exclude element of volume excess. -I think he is still volume overloaded Plan Escalate Lasix to 80 mg IV every 12 hours Mobilize Continue incentive spirometry Wean oxygen BiPAP at at bedtime  SIRS w/ rigors.  -All culture data negative.  Pro-calcitonin was negative.  He had mild pyuria. Plan We will change him to oral if the near for 3 more days empirically  covering for possible UTI DC Foley catheter  Diastolic heart failure; now hypertensive Recent ECHO showed a LVEF of 65-70%. There was evidence of grade 2 diastolic dysfxn Plan Holding antihypertensives Continue telemetry another 24 hours  Acute kidney injury Underlying chronic kidney disease, stage III.  Appears as though baseline creatinine around 1.32.  His creatinine increased to greater than 2.7 now seems to be improving tolerating Lasix Plan Escalate Lasix 80 mg IV every 12 A.m. chemistry  Anemia  -Hemoglobin holding stable Plan Continue SCDs Follow-up a.m. Chemistry  Diabetes Plan Scale insulin  Hypothyroidism  plan Synthroid  stepdown unit DVT prophylaxis: scd SUP: na Diet: reg Activity: OOB Disposition : ICU    Erick Colace ACNP-BC [place] Pager # [phone] OR # [phone] if no answer 04/13/2018, 10:11 AM

## 2018-04-13 NOTE — Progress Notes (Addendum)
Progress Note  Patient Name: Travis [name] Date of Encounter: 04/13/2018  Primary Cardiologist: Mertie Moores, MD   Subjective   Without complaints this AM. Breathing improved, however still requiring O2 and BiPAP at night. Denies chest pain or palpitations. Last BM was last Thursday.   Inpatient Medications    Scheduled Meds: . cefdinir  300 mg Oral Q12H  . escitalopram  10 mg Oral Daily  . fesoterodine  8 mg Oral Daily  . gabapentin  400 mg Oral TID  . insulin aspart  0-15 Units Subcutaneous TID WC  . levothyroxine  175 mcg Oral QAC breakfast  . pramipexole  0.5 mg Oral BID  . senna  1 tablet Oral BID  . tamsulosin  0.4 mg Oral Daily   Continuous Infusions:  PRN Meds: acetaminophen **OR** acetaminophen, HYDROmorphone (DILAUDID) injection, menthol-cetylpyridinium **OR** phenol, ondansetron **OR** ondansetron (ZOFRAN) IV, oxyCODONE   Vital Signs    Vitals:   04/13/18 0755 04/13/18 0800 04/13/18 0900 04/13/18 1000  BP:  (!) 142/72 109/86 99/73  Pulse:  78 81 82  Resp:  (!) 28 (!) 25 (!) 21  Temp: 98.6 F (37 C)     TempSrc: Oral     SpO2:  (!) 89% (!) 87% 90%  Weight:      Height:        Intake/Output Summary (Last 24 hours) at 04/13/2018 1044 Last data filed at 04/13/2018 0900 Gross per 24 hour  Intake 710 ml  Output 2289 ml  Net -1579 ml   Filed Weights   04/08/18 0614 04/08/18 1733 04/11/18 0305  Weight: 292 lb (132.5 kg) (!) 304 lb 0.2 oz (137.9 kg) (!) 306 lb 10.6 oz (139.1 kg)    Telemetry    Sinus rhythm with occasional PVCs - Personally Reviewed  Physical Exam   GEN: Laying in bed in no acute distress.   Neck: No JVD appreciated, no carotid bruits Cardiac: RRR, no murmurs, rubs, or gallops.  Respiratory: decreased breath sounds at bases b/l GI: NABS, Soft,  Non-tender, distended MS: LE wrapped b/l; trace edema; No deformity. Neuro:  Nonfocal, moving all extremities spontaneously Psych: Normal affect   Labs    Chemistry Recent Labs    Lab 04/11/18 0407 04/12/18 0502 04/13/18 0453  NA 137 140 140  K 4.7 4.9 4.4  CL 100* 103 102  CO2 29 29 27   GLUCOSE 195* 157* 185*  BUN 57* 67* 67*  CREATININE 2.33* 2.74* 2.28*  CALCIUM 8.3* 8.4* 8.5*  GFRNONAA 26* 21* 27*  GFRAA 30* 25* 31*  ANIONGAP 8 8 11      Hematology Recent Labs  Lab 04/09/18 1456 04/11/18 0407 04/11/18 1406  WBC 8.7 6.1 9.9  RBC 3.55* 3.34* 3.66*  HGB 10.2* 9.6* 10.6*  HCT 34.2* 31.7* 35.0*  MCV 96.3 94.9 95.6  MCH 28.7 28.7 29.0  MCHC 29.8* 30.3 30.3  RDW 17.1* 16.9* 16.8*  PLT 125* 137* 153    Cardiac EnzymesNo results for input(s): TROPONINI in the last 168 hours. No results for input(s): TROPIPOC in the last 168 hours.   BNP Recent Labs  Lab 04/10/18 0405 04/11/18 0407  BNP 238.1* 120.9*     DDimer No results for input(s): DDIMER in the last 168 hours.   Radiology    Dg Chest Port 1 View  Result Date: 04/12/2018 CLINICAL DATA:  Atelectasis. EXAM: PORTABLE CHEST 1 VIEW COMPARISON:  One-view chest x-ray 04/11/2018 FINDINGS: Heart is enlarged. Size is exaggerated by low lung volumes. Mild pulmonary  vascular congestion is stable. Bibasilar airspace disease is unchanged. No new airspace disease is present. A right pleural effusion is suspected. IMPRESSION: 1. Stable low lung volumes and bibasilar airspace disease, likely atelectasis. Electronically Signed   By: San Morelle M.D.   On: 04/12/2018 07:21   Dg Chest Port 1 View  Result Date: 04/11/2018 CLINICAL DATA:  Shortness of breath EXAM: PORTABLE CHEST 1 VIEW COMPARISON:  April 09, 2018 FINDINGS: There is patchy airspace consolidation in both lung bases with small pleural effusions bilaterally. There is no appreciable interstitial edema. There is cardiomegaly with pulmonary venous hypertension. There is aortic atherosclerosis. There is postoperative change in the lower cervical and lower thoracic regions. IMPRESSION: There is a degree of pulmonary vascular congestion with  small pleural effusions bilaterally. There is no appreciable interstitial edema. There is, however, bilateral airspace consolidation, likely pneumonia. There is aortic atherosclerosis. Areas of postoperative change noted. Aortic Atherosclerosis (ICD10-I70.0). Electronically Signed   By: Lowella Grip III M.D.   On: 04/11/2018 16:17    Cardiac Studies   Echocardiogram 03/06/18: Study Conclusions  - Left ventricle: The cavity size was normal. Wall thickness was increased in a pattern of mild LVH. Systolic function was vigorous. The estimated ejection fraction was in the range of 65% to 70%. Wall motion was normal; there were no regional wall motion abnormalities. Doppler parameters are consistent with pseudonormal left ventricular relaxation (grade 2 diastolic dysfunction). The E/A ratio is >1.5. The E/e&' ratio is >15, suggesting elevated LV filling pressure. - Left atrium: The atrium was mildly dilated. - Inferior vena cava: The vessel was normal in size. The respirophasic diameter changes were in the normal range (>= 50%), consistent with normal central venous pressure.  Impressions:  - Compared to a prior study in 2016, there is no significant change. LV filling pressure is elevated.    Patient Profile     72 y.o.malewith chronic diastolic heart failure, OSA, hypertension, diabetes and mild pulmonary hypertension admitted 04/08/18 for back surgery and developed increased shortness of breath post operatively.  Assessment & Plan    1. Acute respiratory failure: multifactorial in the setting of acute hypercarbic respiratory failure and acute on chronic diastolic CHF requiring BiPAP therapy. Still with significant hypoxia to 70s when O2 via [place] become displaced and still requiring BiPAP at night. Improved UOP -916mL yesterday after receiving am po torsemide and IV lasix 40mg  in the afternoon. Cr peaked at 2.7; improved to 2.2 today.  - IV lasix increased to  80mg  BID by primary team - agree with more aggressive diuresis - Continue to monitor strict I&Os and daily weights  2. HTN: BP soft this AM. Hydralazine and losartan on hold - Continue to monitor BP closely with diuresis - Can restart hydralazine if persistently hypertensive  3. Acute on chronic renal insufficiency stage III: Cr peaked at 2.7; improved to 2.2 today.  - Continue to monitor closely with diuresis  4. ?UTI: patient with rigors and Tmax 100.9 4/14. Ucx, BCx, and procalcitonin all negative. UA with some pyuria. Transitioned to po abx for presumed UTI - Continue management per primary team  5. Constipation: no BM in 5 days. On senna daily - Will add miralax daily   History and all data above reviewed.  Patient examined.  I agree with the findings as above.  The patient exam reveals COR:RRR, tachy  ,  Lungs: Decreased breath sounds at the bases  ,  Abd: Positive bowel sounds, no rebound no guarding, Ext severe edema  .  All available labs, radiology testing, previous records reviewed. Agree with documented assessment and plan. Agree with plans as above with increased Lasix.  Still with hypoxemia and severe extremity edema.  We will follow as needed as volume management is currently being handled by CCM.    Jeneen Rinks [place]  11:41 AM  04/13/2018   For questions or updates, please contact [place] Please consult [email or web address] for contact info under Cardiology/STEMI.      Signed, Abigail Butts, [name]  04/13/2018, 10:44 AM   [phone]

## 2018-04-13 NOTE — Progress Notes (Signed)
Rehab admissions - Patient not medically ready yet for CIR per critical care.  I will check back again in am for medical readiness.  I updated patient and his wife.  Call me for questions.  #[phone]

## 2018-04-13 NOTE — Progress Notes (Signed)
Physical Therapy Treatment Patient Details Name: Travis [name] MRN: [id number] DOB: [birth year] Today's Date: 04/13/2018    History of Present Illness Pt is a 72 yo male admitted for decompressive lumbar laminectomy with hemifacetectomy and foraminotomy at L4-5 [place].  Pt also had C7-T1 medial facetectomry and foraminotomy.  Pt with PMH of previous lumbar surgeries, COPD, CKD, OSA, CHF, DDD. Pt not on home O2 but on 3L today.      PT Comments    Continuing work on functional mobility and activity tolerance;  Opted to use dependent lift OOB to chair today to save energy for sit<>stand from recliner; Excellent motivation and participation; Pt himself indicated he looks forward to cIR for rehab   Follow Up Recommendations  CIR     Equipment Recommendations  None recommended by PT    Recommendations for Other Services       Precautions / Restrictions Precautions Precautions: Fall;Back;Cervical    Mobility  Bed Mobility Overal bed mobility: Needs Assistance Bed Mobility: Rolling Rolling: Mod assist;+2 for physical assistance         General bed mobility comments: Heavy mod assist and close guard to ensure precautions are followed  Transfers Overall transfer level: Needs assistance Equipment used: (see below) Transfers: Sit to/from [facility] for OOB to chair) Sit to Stand: Max assist;+2 physical assistance;From elevated surface         General transfer comment: After using Maxisky to help pt OOB to recliner, Worked on sit to stand transfers from recliner (seat height elevated with bedspreads), facing bed with pt pulling up on bedrails; support given bilaterally at shoulder girdles and use of bed pad to support lift of hips; bil knees blocked; performed 2 reps  Ambulation/Gait                 Stairs             Wheelchair Mobility    Modified Rankin (Stroke Patients Only)       Balance     Sitting balance-Leahy Scale: Poor        Standing balance-Leahy Scale: Zero                              Cognition Arousal/Alertness: Awake/alert Behavior During Therapy: WFL for tasks assessed/performed Overall Cognitive Status: Within Functional Limits for tasks assessed(for simple mobility tasks)                                 General Comments: Functional cognition this session.       Exercises      General Comments General comments (skin integrity, edema, etc.): 6L O2 via [employer] with desat to 85%, improved to 92% with cues      Pertinent Vitals/Pain Pain Assessment: No/denies pain Faces Pain Scale: Hurts a little bit Pain Location: Did not specify; Grimace with mobility Pain Descriptors / Indicators: Grimacing;Guarding;Discomfort Pain Intervention(s): Monitored during session    Home Living                      Prior Function            PT Goals (current goals can now be found in the care plan section) Acute Rehab PT Goals Patient Stated Goal: to walk again PT Goal Formulation: With patient/family Time For Goal Achievement: 04/23/18 Potential to Achieve Goals: Fair Progress towards PT goals:  Progressing toward goals(slowly)    Frequency    Min 5X/week      PT Plan Current plan remains appropriate    Co-evaluation              AM-PAC PT "6 Clicks" Daily Activity  Outcome Measure  Difficulty turning over in bed (including adjusting bedclothes, sheets and blankets)?: Unable Difficulty moving from lying on back to sitting on the side of the bed? : Unable Difficulty sitting down on and standing up from a chair with arms (e.g., wheelchair, bedside commode, etc,.)?: Unable Help needed moving to and from a bed to chair (including a wheelchair)?: Total Help needed walking in hospital room?: Total Help needed climbing 3-5 steps with a railing? : Total 6 Click Score: 6    End of Session Equipment Utilized During Treatment: Gait belt;Oxygen Activity Tolerance:  Patient limited by fatigue Patient left: in chair;with call bell/phone within reach Nurse Communication: Mobility status;Need for lift equipment PT Visit Diagnosis: Unsteadiness on feet (R26.81);Muscle weakness (generalized) (M62.81);Difficulty in walking, not elsewhere classified (R26.2)     Time: 1950-9326 PT Time Calculation (min) (ACUTE ONLY): 56 min  Charges:  $Therapeutic Activity: 53-67 mins                    G Codes:       Roney Marion, PT  Acute Rehabilitation Services Pager [phone] Office [place] 04/13/2018, 4:30 PM

## 2018-04-13 NOTE — Progress Notes (Signed)
Neurosurgery Progress Note  No issues overnight.  Feels well this am No concerns. Eager to be d/c to rehab  EXAM:  BP (!) 130/103   Pulse 82   Temp 98.6 F (37 C) (Oral)   Resp (!) 25   Ht 6\' 1"  (1.854 m)   Wt (!) 139.1 kg (306 lb 10.6 oz)   SpO2 90%   BMI 40.46 kg/m   Awake, alert, oriented  Speech fluent, appropriate  MAEW with good strength Incision c/d/i  PLAN Stable this am from NS perspective Appreciate cards/PCCM assistance with care Ok to start subQ heparin on Thursday

## 2018-04-14 ENCOUNTER — Inpatient Hospital Stay (HOSPITAL_COMMUNITY): Payer: Medicare Other

## 2018-04-14 LAB — BASIC METABOLIC PANEL
Anion gap: 9 (ref 5–15)
BUN: 50 mg/dL — AB (ref 6–20)
CHLORIDE: 104 mmol/L (ref 101–111)
CO2: 29 mmol/L (ref 22–32)
CREATININE: 1.39 mg/dL — AB (ref 0.61–1.24)
Calcium: 8.4 mg/dL — ABNORMAL LOW (ref 8.9–10.3)
GFR calc Af Amer: 56 mL/min — ABNORMAL LOW (ref >60)
GFR calc non Af Amer: 49 mL/min — ABNORMAL LOW (ref >60)
GLUCOSE: 188 mg/dL — AB (ref 65–99)
POTASSIUM: 4.3 mmol/L (ref 3.5–5.1)
Sodium: 142 mmol/L (ref 135–145)

## 2018-04-14 LAB — GLUCOSE, CAPILLARY
GLUCOSE-CAPILLARY: 157 mg/dL — AB (ref 65–99)
Glucose-Capillary: 173 mg/dL — ABNORMAL HIGH (ref 65–99)
Glucose-Capillary: 198 mg/dL — ABNORMAL HIGH (ref 65–99)
Glucose-Capillary: 187 mg/dL — ABNORMAL HIGH (ref 65–99)

## 2018-04-14 LAB — CBC
HEMATOCRIT: 31.4 % — AB (ref 39.0–52.0)
Hemoglobin: 9.5 g/dL — ABNORMAL LOW (ref 13.0–17.0)
MCH: 28.6 pg (ref 26.0–34.0)
MCHC: 30.3 g/dL (ref 30.0–36.0)
MCV: 94.6 fL (ref 78.0–100.0)
Platelets: 150 10*3/uL (ref 150–400)
RBC: 3.32 MIL/uL — ABNORMAL LOW (ref 4.22–5.81)
RDW: 16.1 % — AB (ref 11.5–15.5)
WBC: 5.2 10*3/uL (ref 4.0–10.5)

## 2018-04-14 MED ORDER — METOPROLOL TARTRATE 25 MG PO TABS
25.0000 mg | ORAL_TABLET | Freq: Two times a day (BID) | ORAL | Status: DC
  Administered 2018-04-14 – 2018-04-16 (×4): 25 mg via ORAL
  Filled 2018-04-14 (×4): qty 1

## 2018-04-14 MED ORDER — FENTANYL CITRATE (PF) 100 MCG/2ML IJ SOLN: 12.5000 ug | INTRAMUSCULAR | Status: DC | PRN

## 2018-04-14 MED ORDER — FUROSEMIDE 10 MG/ML IJ SOLN
40.0000 mg | Freq: Three times a day (TID) | INTRAMUSCULAR | Status: AC
  Administered 2018-04-14: 40 mg via INTRAVENOUS
  Filled 2018-04-14: qty 4

## 2018-04-14 MED ORDER — POTASSIUM CHLORIDE CRYS ER 20 MEQ PO TBCR
40.0000 meq | EXTENDED_RELEASE_TABLET | Freq: Once | ORAL | Status: AC
  Administered 2018-04-14: 40 meq via ORAL
  Filled 2018-04-14: qty 2

## 2018-04-14 MED ORDER — FUROSEMIDE 10 MG/ML IJ SOLN
80.0000 mg | Freq: Two times a day (BID) | INTRAMUSCULAR | Status: DC
  Administered 2018-04-15 – 2018-04-16 (×3): 80 mg via INTRAVENOUS
  Filled 2018-04-14 (×3): qty 8

## 2018-04-14 NOTE — Progress Notes (Signed)
   04/14/18 1400   PT - Assessment/Plan  PT Plan Frequency needs to be updated  PT Frequency (ACUTE ONLY) Min 3X/week  Pt frequency updated to more appropriate frequency of 3x week due to anticipated slow progress.  Will follow acutely.   [place] [phone] (pager)

## 2018-04-14 NOTE — Progress Notes (Signed)
No issues overnight. Pt c/o some chest "congestion," otherwise ok.   EXAM:  BP (!) 158/70   Pulse 85   Temp 98.8 F (37.1 C) (Oral)   Resp 20   Ht 6\' 1"  (1.854 m)   Wt (!) 138.7 kg (305 lb 12.5 oz)   SpO2 93%   BMI 40.34 kg/m   Awake, alert, oriented  Speech fluent, appropriate  CN grossly intact  Good strength BLE   IMPRESSION:  72 y.o. male s/p lumbar decompression fusion with postoperative CHF exacerbation, acute on chronic renal insufficiency, and ?SIRS/UTI. Appears to be improving  PLAN: - Appreciate cardiology/PCCM assistance. Will arrange for CIR once medically stable

## 2018-04-14 NOTE — Progress Notes (Addendum)
PULMONARY / CRITICAL CARE MEDICINE   Name: Travis [name] MRN: [id number] DOB: Dec 01, 1946    ADMISSION DATE:  04/08/2018 CONSULTATION DATE:  04/09/18  REFERRING MD:  Hoyt Koch  CHIEF COMPLAINT:  Acute resp , acidosis, altered mental status  HISTORY OF PRESENT ILLNESS:    72 year old male patient with history of chronic diastolic heart failure, obstructive sleep apnea, mild pulmonary artery hypertension.  Status post decompressive lumbar laminectomy on 4/12.  Critical care asked to see in consult postoperatively due to altered sensorium and progressive hypercarbic respiratory failure  Subjective Feels better Still desaturating at night   Objective VITAL SIGNS: BP (!) 180/80   Pulse 81   Temp 98.8 F (37.1 C) (Oral)   Resp (!) 25   Ht 6\' 1"  (1.854 m)   Wt (!) 305 lb 12.5 oz (138.7 kg)   SpO2 (!) 85%   BMI 40.34 kg/m      VENTILATOR SETTINGS: FiO2 (%):  [50 %] 50 %  INTAKE / OUTPUT:  Intake/Output Summary (Last 24 hours) at 04/14/2018 1006 Last data filed at 04/14/2018 0800 Gross per 24 hour  Intake -  Output 2740 ml  Net -2740 ml   PHYSICAL EXAMINATION: General: 72 year old male resting in bed no acute distress HEENT normocephalic atraumatic no jugular venous distention mucous membranes moist Pulmonary: Diminished bases no accessory use Cardiac: Regular rate and rhythm Abdomen: Soft nontender Extremities: Brisk cap refill, lower extremity edema chronic.  Warm, strong pulses. Neuro: Alert oriented no focal deficits.  LABS:  BMET Recent Labs  Lab 04/11/18 0407 04/12/18 0502 04/13/18 0453  NA 137 140 140  K 4.7 4.9 4.4  CL 100* 103 102  CO2 29 29 27   BUN 57* 67* 67*  CREATININE 2.33* 2.74* 2.28*  GLUCOSE 195* 157* 185*   Electrolytes Recent Labs  Lab 04/10/18 0405 04/11/18 0407 04/12/18 0502 04/13/18 0453  CALCIUM 8.2* 8.3* 8.4* 8.5*  MG 2.2  --   --   --   PHOS 5.3*  --   --   --    CBC Recent Labs  Lab 04/09/18 1456 04/11/18 0407  04/11/18 1406  WBC 8.7 6.1 9.9  HGB 10.2* 9.6* 10.6*  HCT 34.2* 31.7* 35.0*  PLT 125* 137* 153   Coag's No results for input(s): APTT, INR in the last 168 hours.  Sepsis Markers Recent Labs  Lab 04/11/18 1406 04/12/18 0502 04/13/18 0453  LATICACIDVEN 0.9  --   --   PROCALCITON <0.10 0.11 <0.10   ABG Recent Labs  Lab 04/09/18 2132 04/10/18 0300 04/11/18 0359  PHART 7.267* 7.290* 7.301*  PCO2ART 69.3* 64.7* 65.5*  PO2ART 85.6 96.3 69.2*   Liver Enzymes No results for input(s): AST, ALT, ALKPHOS, BILITOT, ALBUMIN in the last 168 hours.  Cardiac Enzymes No results for input(s): TROPONINI, PROBNP in the last 168 hours.  Glucose Recent Labs  Lab 04/12/18 2220 04/13/18 0751 04/13/18 1127 04/13/18 1602 04/13/18 2146 04/14/18 0809  GLUCAP 203* 224* 203* 197* 170* 173*   Imaging Dg Chest Port 1 View  Result Date: 04/14/2018 CLINICAL DATA:  Respiratory failure.  Recent spine surgery. EXAM: PORTABLE CHEST 1 VIEW COMPARISON:  04/12/2018 FINDINGS: Persistent and slightly worsened atelectasis in both lung bases. Upper lungs remain largely clear. This could be simple atelectasis or atelectatic pneumonia. No visible effusion. Healing rib fracture on the left of the fifth rib. IMPRESSION: Persistent and slightly worsened atelectasis at both lung bases. Electronically Signed   By: Nelson Chimes M.D.   On: 04/14/2018 09:42  Cultures UC 4/14>>> negative Blood 4/14>>>  Antibiotics vanc 4/14>>> stop 4/15 Zosyn 4/14>>> -4/16 Ceftin ear 4/16  ASSESSMENT / PLAN:  Acute encephalopathy in the setting of hypercarbia and residual anesthesia,   possibly sepsis and fever related; Remains confused today Plan Supportive care Neuro checks per unit routine  Acute on chronic hypercarbic respiratory failure in the setting of residual anesthesia, atelectasis, and hypoventilation. History of obstructive sleep apnea +/- obesity hypoventilation syndrome CXR with worsening atelectasis  4/17 Requiring high flow oxygen 8 L this am>> Increased flow to 8 L   Baseline CO2 calculates around 50 Portable chest x-ray obtained on 4/15: demonstrates low volume, probable atelectasis, CXR 4/17 indicates worsening atelectaiss Diuresed well 4/16 - 3 L Saturation goal is 88-92% Will need ambulatory saturation prior to discharge to evaluate for O2 needs.  Plan CXR now CXR in am Mobilize/ OOB to chair with PT/OT Agressive Pulmonary Toilet IS Q 1 while awake Wean oxygen as able BiPAP at at bedtime  SIRS w/ rigors. ? UTI vs atelectatic pna -All culture data negative.  Pro-calcitonin was negative.  He had mild pyuria. T Max 99.3 Plan Trend CBC and fever curve Continue oral omnicef  for 2 more days empirically covering for possible UTI CXR daily Culture as is clinically indicated Trend micro for results Condom cath  Diastolic heart failure; now hypertensive Recent ECHO showed a LVEF of 65-70%. There was evidence of grade 2 diastolic dysfxn Net negative 3 L after aggressive diuresis 4/16 Plan Holding antihypertensives Continue telemetry another 24 hours Diuresis as renal function allows( BMET pending)   Acute kidney injury Underlying chronic kidney disease, stage III.   baseline creatinine around 1.32.   His creatinine increased to greater than 2.7  No BMET 4/17 after diuresis 4/16 Plan Escalate Lasix 80 mg IV every 12 Chemistry now Trend BMET daily Monitor UO  Anemia  -Hemoglobin holding stable Plan Continue SCDs Trend CBC  Diabetes Plan SSI CBG Q 4  Hypothyroidism  plan Synthroid   DVT prophylaxis: scd SUP: na Diet: reg Activity: OOB Disposition : ICU   Will go to rehab once respiratory status returns to baseline. PT/OT following. Needs aggressive Pulmonary Toilet and mobilization for atelectasis.   Magdalen Spatz, AGACNP-BC [place] Pager  # [phone] if no answer 04/14/2018, 10:06 AM  Attending Note:  72 year  old male with extensive back history and chronic pain who presents to the hospital for a decompressive laminectomy of lumbar spine who presents to PCCM with acute on chronic hypoxemic respiratory failure.  On exam, patient is on HFNC with no BS on the bases.  I reviewed CXR myself, significant atelectasis noted.  Discussed with PCCM-NP.  Will hold in the ICU given increase in O2 demand, doubled over the past few hours.  BiPAP at night.  Stressed need for mobility and need for IS to combat atelectasis.  Will add lopressor for HTN and tachycardia.  D/C dilaudid as it is slower to clear and replace with fentanyl.  Maintain BiPAP at night.  Hold in the ICU.  Active diureses today, lasix 40 mg IV q8 x2 doses.  K-Dur 40 meq PO x1.  PCCM will continue to follow while in the ICU.  The patient is critically ill with multiple organ systems failure and requires high complexity decision making for assessment and support, frequent evaluation and titration of therapies, application of advanced monitoring technologies and extensive interpretation of multiple databases.   Critical Care Time devoted to patient care services described  in this note is  35  Minutes. This time reflects time of care of this signee Dr Jennet Maduro. This critical care time does not reflect procedure time, or teaching time or supervisory time of PA/NP/Med student/Med Resident etc but could involve care discussion time.  Rush Farmer, M.D. [facility] Pulmonary/Critical Care Medicine. Pager: [phone]. After hours pager: [phone].

## 2018-04-14 NOTE — Progress Notes (Signed)
Progress Note  Patient Name: Travis [name] Date of Encounter: 04/14/2018  Primary Cardiologist:   Mertie Moores, MD   Subjective   His wife reports that he is confused.  He is weak.  He denies any chest pain or acute SOB.   Inpatient Medications    Scheduled Meds: . escitalopram  10 mg Oral Daily  . fesoterodine  8 mg Oral Daily  . gabapentin  400 mg Oral TID  . insulin aspart  0-15 Units Subcutaneous TID WC  . levothyroxine  175 mcg Oral QAC breakfast  . polyethylene glycol  17 g Oral Daily  . pramipexole  0.5 mg Oral BID  . senna  1 tablet Oral BID  . tamsulosin  0.4 mg Oral Daily   Continuous Infusions:  PRN Meds: acetaminophen **OR** acetaminophen, HYDROmorphone (DILAUDID) injection, menthol-cetylpyridinium **OR** phenol, ondansetron **OR** ondansetron (ZOFRAN) IV, oxyCODONE   Vital Signs    Vitals:   04/14/18 1000 04/14/18 1100 04/14/18 1134 04/14/18 1200  BP: (!) 158/70 (!) 163/74    Pulse: 85 84  96  Resp: 20 (!) 25  (!) 26  Temp:   99.4 F (37.4 C)   TempSrc:   Oral   SpO2: 93% 93%  (!) 82%  Weight:      Height:        Intake/Output Summary (Last 24 hours) at 04/14/2018 1345 Last data filed at 04/14/2018 1200 Gross per 24 hour  Intake -  Output 2005 ml  Net -2005 ml   Filed Weights   04/08/18 1733 04/11/18 0305 04/14/18 0500  Weight: (!) 304 lb 0.2 oz (137.9 kg) (!) 306 lb 10.6 oz (139.1 kg) (!) 305 lb 12.5 oz (138.7 kg)    Telemetry    NSR, sinus tach - Personally Reviewed  ECG    NA - Personally Reviewed  Physical Exam   GEN: No acute distress.   Neck: No  JVD Cardiac: RRR, NO murmurs, rubs, or gallops.  Respiratory:   Decreased breath sounds with wheezing GI: Soft, nontender, non-distended  MS:    Severe edema; No deformity. Neuro:  Nonfocal  Psych: Normal affect   Labs    Chemistry Recent Labs  Lab 04/12/18 0502 04/13/18 0453 04/14/18 1239  NA 140 140 142  K 4.9 4.4 4.3  CL 103 102 104  CO2 29 27 29   GLUCOSE 157*  185* 188*  BUN 67* 67* 50*  CREATININE 2.74* 2.28* 1.39*  CALCIUM 8.4* 8.5* 8.4*  GFRNONAA 21* 27* 49*  GFRAA 25* 31* 56*  ANIONGAP 8 11 9      Hematology Recent Labs  Lab 04/11/18 0407 04/11/18 1406 04/14/18 1239  WBC 6.1 9.9 5.2  RBC 3.34* 3.66* 3.32*  HGB 9.6* 10.6* 9.5*  HCT 31.7* 35.0* 31.4*  MCV 94.9 95.6 94.6  MCH 28.7 29.0 28.6  MCHC 30.3 30.3 30.3  RDW 16.9* 16.8* 16.1*  PLT 137* 153 150    Cardiac EnzymesNo results for input(s): TROPONINI in the last 168 hours. No results for input(s): TROPIPOC in the last 168 hours.   BNP Recent Labs  Lab 04/10/18 0405 04/11/18 0407  BNP 238.1* 120.9*     DDimer No results for input(s): DDIMER in the last 168 hours.   Radiology    Dg Chest Port 1 View  Result Date: 04/14/2018 CLINICAL DATA:  Respiratory failure.  Recent spine surgery. EXAM: PORTABLE CHEST 1 VIEW COMPARISON:  04/12/2018 FINDINGS: Persistent and slightly worsened atelectasis in both lung bases. Upper lungs remain largely clear.  This could be simple atelectasis or atelectatic pneumonia. No visible effusion. Healing rib fracture on the left of the fifth rib. IMPRESSION: Persistent and slightly worsened atelectasis at both lung bases. Electronically Signed   By: Nelson Chimes M.D.   On: 04/14/2018 09:42    Cardiac Studies   Echocardiogram 03/06/18: Study Conclusions  - Left ventricle: The cavity size was normal. Wall thickness was increased in a pattern of mild LVH. Systolic function was vigorous. The estimated ejection fraction was in the range of 65% to 70%. Wall motion was normal; there were no regional wall motion abnormalities. Doppler parameters are consistent with pseudonormal left ventricular relaxation (grade 2 diastolic dysfunction). The E/A ratio is >1.5. The E/e&' ratio is >15, suggesting elevated LV filling pressure. - Left atrium: The atrium was mildly dilated. - Inferior vena cava: The vessel was normal in size.  The respirophasic diameter changes were in the normal range (>= 50%), consistent with normal central venous pressure.  Impressions:  - Compared to a prior study in 2016, there is no significant change. LV filling pressure is elevated.     Patient Profile     72 y.o. male with chronic diastolic heart failure, OSA, hypertension, diabetes and mild pulmonary hypertension admitted 04/08/18 for back surgery and developed increased shortness of breath post operatively.  Assessment & Plan    ACUTE RESPIRATORY FAILURE:   Still with SOB.    ACUTE ON CHRONIC DIASTOLIC HF:  See above.   I will order bid Lasix IV   HTN:  BP remains elevated.  I will restart hydralazine.  He was on this at home.    AKI:   Creat continues to fall.  Follow   For questions or updates, please contact [place] Please consult [email or web address] for contact info under Cardiology/STEMI.   Signed, Minus Breeding, MD  04/14/2018, 1:45 PM

## 2018-04-14 NOTE — Progress Notes (Signed)
Rehab admissions - Not medically ready yet for inpatient rehab admission.  On high flow nasal 02 at this time.  I will check back in am for medical readiness.  Call me for questions.  #[phone]

## 2018-04-15 ENCOUNTER — Inpatient Hospital Stay (HOSPITAL_COMMUNITY): Payer: Medicare Other

## 2018-04-15 LAB — BASIC METABOLIC PANEL
ANION GAP: 8 (ref 5–15)
BUN: 43 mg/dL — ABNORMAL HIGH (ref 6–20)
CHLORIDE: 103 mmol/L (ref 101–111)
CO2: 33 mmol/L — ABNORMAL HIGH (ref 22–32)
Calcium: 8.5 mg/dL — ABNORMAL LOW (ref 8.9–10.3)
Creatinine, Ser: 1.34 mg/dL — ABNORMAL HIGH (ref 0.61–1.24)
GFR, EST AFRICAN AMERICAN: 59 mL/min — AB (ref >60)
GFR, EST NON AFRICAN AMERICAN: 51 mL/min — AB (ref >60)
Glucose, Bld: 205 mg/dL — ABNORMAL HIGH (ref 65–99)
POTASSIUM: 4.5 mmol/L (ref 3.5–5.1)
SODIUM: 144 mmol/L (ref 135–145)

## 2018-04-15 LAB — GLUCOSE, CAPILLARY
GLUCOSE-CAPILLARY: 163 mg/dL — AB (ref 65–99)
Glucose-Capillary: 173 mg/dL — ABNORMAL HIGH (ref 65–99)
Glucose-Capillary: 197 mg/dL — ABNORMAL HIGH (ref 65–99)
Glucose-Capillary: 237 mg/dL — ABNORMAL HIGH (ref 65–99)

## 2018-04-15 LAB — CBC
HCT: 31.4 % — ABNORMAL LOW (ref 39.0–52.0)
Hemoglobin: 9.4 g/dL — ABNORMAL LOW (ref 13.0–17.0)
MCH: 28.7 pg (ref 26.0–34.0)
MCHC: 29.9 g/dL — AB (ref 30.0–36.0)
MCV: 95.7 fL (ref 78.0–100.0)
PLATELETS: 153 10*3/uL (ref 150–400)
RBC: 3.28 MIL/uL — AB (ref 4.22–5.81)
RDW: 16 % — AB (ref 11.5–15.5)
WBC: 5.3 10*3/uL (ref 4.0–10.5)

## 2018-04-15 MED ORDER — ORAL CARE MOUTH RINSE
15.0000 mL | Freq: Two times a day (BID) | OROMUCOSAL | Status: DC
  Administered 2018-04-15 – 2018-04-16 (×2): 15 mL via OROMUCOSAL

## 2018-04-15 MED ORDER — HYDRALAZINE HCL 25 MG PO TABS
25.0000 mg | ORAL_TABLET | Freq: Three times a day (TID) | ORAL | Status: DC
  Administered 2018-04-15 – 2018-04-16 (×4): 25 mg via ORAL
  Filled 2018-04-15 (×4): qty 1

## 2018-04-15 NOTE — Discharge Summary (Signed)
Physician Discharge Summary  Patient ID: Travis [name] MRN: [id number] DOB/AGE: [birth year] 72 y.o.  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 The patient tolerated the procedure well and was taken to the recovery room and then to the floor in stable condition. The head difficulty with mobilization because of his preoperative disability with myelopathy. He had setting of his congestive heart failure with pulmonary edema. He was transferred to the stepdown unit. Cardiology and pulmonary were involved. He was diuresed with Lasix and his oxygenation improved. Rehabilitation was consulted. The wound remained clean dry and intact. Pt had appropriate back soreness. No complaints of leg pain or new N/T/W. he felt like his legs were somewhat stronger than before surgery. The patient remained afebrile with stable vital signs, and tolerated a regular diet. The patient continued to increase activities, and pain was well controlled with oral pain medications.   Consults: cardiology, pulmonary/intensive care and rehabilitation medicine  Significant Diagnostic Studies:  Results for orders placed or performed during the hospital encounter of 04/08/18  Urine Culture  Result Value Ref Range   Specimen Description URINE, CATHETERIZED    Special Requests NONE    Culture      NO GROWTH Performed at [facility] [address], [place], [place] [address]    Report Status 04/12/2018 FINAL   Culture, blood (Routine X 2) w Reflex to ID Panel  Result Value Ref Range   Specimen Description BLOOD LEFT ANTECUBITAL    Special Requests      BOTTLES DRAWN AEROBIC AND  ANAEROBIC Blood Culture adequate volume   Culture      NO GROWTH 3 DAYS Performed at [facility], [place] [address], [place], [place] [address]    Report Status PENDING   Culture, blood (Routine X 2) w Reflex to ID Panel  Result Value Ref Range   Specimen Description BLOOD LEFT ANTECUBITAL    Special Requests      BOTTLES DRAWN AEROBIC AND ANAEROBIC Blood Culture adequate volume   Culture      NO GROWTH 3 DAYS Performed at [facility], [place] [address], [place], [place] [address]    Report Status PENDING   Glucose, capillary  Result Value Ref Range   Glucose-Capillary 178 (H) 65 - 99 mg/dL   Comment 1 Notify RN    Comment 2 Document in Chart   Glucose, capillary  Result Value Ref Range   Glucose-Capillary 205 (H) 65 - 99 mg/dL   Comment 1 Notify RN   Glucose, capillary  Result Value Ref Range   Glucose-Capillary 228 (H) 65 - 99 mg/dL  Glucose, capillary  Result Value Ref Range   Glucose-Capillary 262 (H) 65 - 99 mg/dL   Comment 1 Notify RN    Comment 2 Document in Chart   Glucose, capillary  Result Value Ref Range   Glucose-Capillary 209 (H) 65 - 99 mg/dL   Comment 1 Notify RN    Comment 2 Document in Chart   Glucose, capillary  Result Value Ref Range   Glucose-Capillary 152 (H) 65 - 99 mg/dL   Comment 1 Notify RN    Comment 2 Document in Chart   Blood gas, arterial  Result Value Ref Range   FIO2 28.00    O2 Content 2.5 L/min   pH, Arterial 7.259 (L) 7.350 - 7.450   pCO2 arterial 71.1 (HH) 32.0 - 48.0 mmHg   pO2, Arterial 61.7 (L) 83.0 - 108.0 mmHg   Bicarbonate 30.7 (H) 20.0 - 28.0 mmol/L   Acid-Base Excess 4.1 (H) 0.0 - 2.0 mmol/L   O2 Saturation 89.1 %   Patient temperature 98.6    Collection site RADIAL    Drawn by [id number]    Sample type ARTERIAL    Allens test (pass/fail) PASS PASS  Basic metabolic panel  Result Value Ref Range   Sodium 140 135 - 145 mmol/L   Potassium 4.5 3.5 - 5.1 mmol/L   Chloride 101 101 - 111 mmol/L   CO2 30 22 - 32  mmol/L   Glucose, Bld 216 (H) 65 - 99 mg/dL   BUN 46 (H) 6 - 20 mg/dL   Creatinine, Ser 2.64 (H) 0.61 - 1.24 mg/dL   Calcium 8.2 (L) 8.9 - 10.3 mg/dL   GFR calc non Af Amer 22 (L) >60 mL/min   GFR calc Af Amer 26 (L) >60 mL/min   Anion gap 9 5 - 15  CBC  Result Value Ref Range   WBC 8.7 4.0 - 10.5 K/uL   RBC 3.55 (L) 4.22 - 5.81 MIL/uL   Hemoglobin 10.2 (L) 13.0 - 17.0 g/dL   HCT 34.2 (L) 39.0 - 52.0 %   MCV 96.3 78.0 - 100.0 fL   MCH 28.7 26.0 - 34.0 pg   MCHC 29.8 (L) 30.0 - 36.0 g/dL   RDW 17.1 (H) 11.5 - 15.5 %   Platelets 125 (L) 150 - 400 K/uL  Glucose, capillary  Result Value Ref Range   Glucose-Capillary 187 (H) 65 - 99 mg/dL   Comment 1 Notify RN    Comment 2 Document in Chart   Basic metabolic panel  Result Value Ref Range   Sodium 139 135 - 145 mmol/L   Potassium 4.8 3.5 - 5.1 mmol/L   Chloride 101 101 - 111 mmol/L   CO2 29 22 - 32 mmol/L   Glucose, Bld 171 (H) 65 - 99 mg/dL   BUN 51 (H) 6 - 20 mg/dL   Creatinine, Ser 2.72 (H) 0.61 - 1.24 mg/dL   Calcium 8.2 (L) 8.9 - 10.3 mg/dL   GFR calc non Af Amer 22 (L) >60 mL/min   GFR calc Af Amer 25 (L) >60 mL/min   Anion gap 9 5 - 15  T4, free  Result Value Ref Range   Free T4 1.58 (H) 0.61 - 1.12 ng/dL  TSH  Result Value Ref Range   TSH 1.163 0.350 - 4.500 uIU/mL  Magnesium  Result Value Ref Range   Magnesium 2.2 1.7 - 2.4 mg/dL  Phosphorus  Result Value Ref Range   Phosphorus 5.3 (H) 2.5 - 4.6 mg/dL  Brain natriuretic peptide  Result Value Ref Range   B Natriuretic Peptide 238.1 (H) 0.0 - 100.0 pg/mL  Procalcitonin - Baseline  Result Value Ref Range   Procalcitonin <0.10 ng/mL  Blood gas, arterial  Result Value Ref Range   FIO2 50.00    Inspiratory PAP 12    Expiratory PAP 6    pH, Arterial 7.262 (L) 7.350 - 7.450   pCO2 arterial 70.2 (HH) 32.0 - 48.0 mmHg   pO2, Arterial 77.3 (L) 83.0 - 108.0 mmHg   Bicarbonate 30.6 (H) 20.0 - 28.0 mmol/L   Acid-Base Excess  4.0 (H) 0.0 - 2.0 mmol/L   O2  Saturation 94.1 %   Patient temperature 98.6    Collection site RADIAL    Drawn by [id number]    Sample type ARTERIAL    Allens test (pass/fail) PASS PASS  Blood gas, arterial  Result Value Ref Range   FIO2 0.50    Delivery systems BILEVEL POSITIVE AIRWAY PRESSURE    LHR 15 resp/min   Inspiratory PAP 18    Expiratory PAP 8    pH, Arterial 7.267 (L) 7.350 - 7.450   pCO2 arterial 69.3 (HH) 32.0 - 48.0 mmHg   pO2, Arterial 85.6 83.0 - 108.0 mmHg   Bicarbonate 30.6 (H) 20.0 - 28.0 mmol/L   Acid-Base Excess 4.1 (H) 0.0 - 2.0 mmol/L   O2 Saturation 95.6 %   Patient temperature 98.6    Collection site RIGHT RADIAL    Drawn by [id number]    Allens test (pass/fail) PASS PASS  Glucose, capillary  Result Value Ref Range   Glucose-Capillary 162 (H) 65 - 99 mg/dL  Blood gas, arterial  Result Value Ref Range   FIO2 50.00    Delivery systems BILEVEL POSITIVE AIRWAY PRESSURE    Mode BILEVEL POSITIVE AIRWAY PRESSURE    LHR 15 resp/min   Inspiratory PAP 18    Expiratory PAP 8    pH, Arterial 7.290 (L) 7.350 - 7.450   pCO2 arterial 64.7 (H) 32.0 - 48.0 mmHg   pO2, Arterial 96.3 83.0 - 108.0 mmHg   Bicarbonate 30.1 (H) 20.0 - 28.0 mmol/L   Acid-Base Excess 4.0 (H) 0.0 - 2.0 mmol/L   O2 Saturation 97.1 %   Patient temperature 98.6    Collection site RIGHT RADIAL    Drawn by [id number]    Sample type ARTERIAL DRAW    Allens test (pass/fail) PASS PASS  Glucose, capillary  Result Value Ref Range   Glucose-Capillary 140 (H) 65 - 99 mg/dL  Glucose, capillary  Result Value Ref Range   Glucose-Capillary 178 (H) 65 - 99 mg/dL  Glucose, capillary  Result Value Ref Range   Glucose-Capillary 152 (H) 65 - 99 mg/dL  Basic metabolic panel  Result Value Ref Range   Sodium 137 135 - 145 mmol/L   Potassium 4.7 3.5 - 5.1 mmol/L   Chloride 100 (L) 101 - 111 mmol/L   CO2 29 22 - 32 mmol/L   Glucose, Bld 195 (H) 65 - 99 mg/dL   BUN 57 (H) 6 - 20 mg/dL   Creatinine, Ser 2.33 (H) 0.61 - 1.24 mg/dL   Calcium  8.3 (L) 8.9 - 10.3 mg/dL   GFR calc non Af Amer 26 (L) >60 mL/min   GFR calc Af Amer 30 (L) >60 mL/min   Anion gap 8 5 - 15  CBC  Result Value Ref Range   WBC 6.1 4.0 - 10.5 K/uL   RBC 3.34 (L) 4.22 - 5.81 MIL/uL   Hemoglobin 9.6 (L) 13.0 - 17.0 g/dL   HCT 31.7 (L) 39.0 - 52.0 %   MCV 94.9 78.0 - 100.0 fL   MCH 28.7 26.0 - 34.0 pg   MCHC 30.3 30.0 - 36.0 g/dL   RDW 16.9 (H) 11.5 - 15.5 %   Platelets 137 (L) 150 - 400 K/uL  Blood gas, arterial  Result Value Ref Range   FIO2 0.40    Delivery systems CONTINUOUS POSITIVE AIRWAY PRESSURE    Expiratory PAP 8.0    pH, Arterial 7.301 (L) 7.350 - 7.450   pCO2  arterial 65.5 (HH) 32.0 - 48.0 mmHg   pO2, Arterial 69.2 (L) 83.0 - 108.0 mmHg   Bicarbonate 31.3 (H) 20.0 - 28.0 mmol/L   Acid-Base Excess 5.2 (H) 0.0 - 2.0 mmol/L   O2 Saturation 93.1 %   Patient temperature 98.6    Collection site RIGHT RADIAL    Drawn by [id number]    Sample type ARTERIAL DRAW    Allens test (pass/fail) PASS PASS  Glucose, capillary  Result Value Ref Range   Glucose-Capillary 212 (H) 65 - 99 mg/dL  Glucose, capillary  Result Value Ref Range   Glucose-Capillary 142 (H) 65 - 99 mg/dL  Glucose, capillary  Result Value Ref Range   Glucose-Capillary 175 (H) 65 - 99 mg/dL  Urinalysis, Routine w reflex microscopic  Result Value Ref Range   Color, Urine YELLOW YELLOW   APPearance HAZY (A) CLEAR   Specific Gravity, Urine 1.014 1.005 - 1.030   pH 5.0 5.0 - 8.0   Glucose, UA NEGATIVE NEGATIVE mg/dL   Hgb urine dipstick NEGATIVE NEGATIVE   Bilirubin Urine NEGATIVE NEGATIVE   Ketones, ur NEGATIVE NEGATIVE mg/dL   Protein, ur NEGATIVE NEGATIVE mg/dL   Nitrite NEGATIVE NEGATIVE   Leukocytes, UA SMALL (A) NEGATIVE   RBC / HPF 0-5 0 - 5 RBC/hpf   WBC, UA 6-30 0 - 5 WBC/hpf   Bacteria, UA RARE (A) NONE SEEN   Squamous Epithelial / LPF 0-5 (A) NONE SEEN   Mucus PRESENT    Hyaline Casts, UA PRESENT   Brain natriuretic peptide  Result Value Ref Range   B  Natriuretic Peptide 120.9 (H) 0.0 - 100.0 pg/mL  CBC  Result Value Ref Range   WBC 9.9 4.0 - 10.5 K/uL   RBC 3.66 (L) 4.22 - 5.81 MIL/uL   Hemoglobin 10.6 (L) 13.0 - 17.0 g/dL   HCT 35.0 (L) 39.0 - 52.0 %   MCV 95.6 78.0 - 100.0 fL   MCH 29.0 26.0 - 34.0 pg   MCHC 30.3 30.0 - 36.0 g/dL   RDW 16.8 (H) 11.5 - 15.5 %   Platelets 153 150 - 400 K/uL  Procalcitonin - Baseline  Result Value Ref Range   Procalcitonin <0.10 ng/mL  Lactic acid, plasma  Result Value Ref Range   Lactic Acid, Venous 0.9 0.5 - 1.9 mmol/L  Glucose, capillary  Result Value Ref Range   Glucose-Capillary 217 (H) 65 - 99 mg/dL  Basic metabolic panel  Result Value Ref Range   Sodium 140 135 - 145 mmol/L   Potassium 4.9 3.5 - 5.1 mmol/L   Chloride 103 101 - 111 mmol/L   CO2 29 22 - 32 mmol/L   Glucose, Bld 157 (H) 65 - 99 mg/dL   BUN 67 (H) 6 - 20 mg/dL   Creatinine, Ser 2.74 (H) 0.61 - 1.24 mg/dL   Calcium 8.4 (L) 8.9 - 10.3 mg/dL   GFR calc non Af Amer 21 (L) >60 mL/min   GFR calc Af Amer 25 (L) >60 mL/min   Anion gap 8 5 - 15  Procalcitonin  Result Value Ref Range   Procalcitonin 0.11 ng/mL  Glucose, capillary  Result Value Ref Range   Glucose-Capillary 191 (H) 65 - 99 mg/dL  Glucose, capillary  Result Value Ref Range   Glucose-Capillary 135 (H) 65 - 99 mg/dL  Glucose, capillary  Result Value Ref Range   Glucose-Capillary 195 (H) 65 - 99 mg/dL  Glucose, capillary  Result Value Ref Range   Glucose-Capillary 188 (H) 65 -  99 mg/dL  Procalcitonin  Result Value Ref Range   Procalcitonin <0.10 ng/mL  Glucose, capillary  Result Value Ref Range   Glucose-Capillary 186 (H) 65 - 99 mg/dL  Glucose, capillary  Result Value Ref Range   Glucose-Capillary 203 (H) 65 - 99 mg/dL  Glucose, capillary  Result Value Ref Range   Glucose-Capillary 224 (H) 65 - 99 mg/dL  Basic metabolic panel  Result Value Ref Range   Sodium 140 135 - 145 mmol/L   Potassium 4.4 3.5 - 5.1 mmol/L   Chloride 102 101 - 111 mmol/L    CO2 27 22 - 32 mmol/L   Glucose, Bld 185 (H) 65 - 99 mg/dL   BUN 67 (H) 6 - 20 mg/dL   Creatinine, Ser 2.28 (H) 0.61 - 1.24 mg/dL   Calcium 8.5 (L) 8.9 - 10.3 mg/dL   GFR calc non Af Amer 27 (L) >60 mL/min   GFR calc Af Amer 31 (L) >60 mL/min   Anion gap 11 5 - 15  Glucose, capillary  Result Value Ref Range   Glucose-Capillary 203 (H) 65 - 99 mg/dL  Glucose, capillary  Result Value Ref Range   Glucose-Capillary 197 (H) 65 - 99 mg/dL  Glucose, capillary  Result Value Ref Range   Glucose-Capillary 170 (H) 65 - 99 mg/dL  Glucose, capillary  Result Value Ref Range   Glucose-Capillary 173 (H) 65 - 99 mg/dL   Comment 1 Notify RN   Basic metabolic panel  Result Value Ref Range   Sodium 142 135 - 145 mmol/L   Potassium 4.3 3.5 - 5.1 mmol/L   Chloride 104 101 - 111 mmol/L   CO2 29 22 - 32 mmol/L   Glucose, Bld 188 (H) 65 - 99 mg/dL   BUN 50 (H) 6 - 20 mg/dL   Creatinine, Ser 1.39 (H) 0.61 - 1.24 mg/dL   Calcium 8.4 (L) 8.9 - 10.3 mg/dL   GFR calc non Af Amer 49 (L) >60 mL/min   GFR calc Af Amer 56 (L) >60 mL/min   Anion gap 9 5 - 15  CBC  Result Value Ref Range   WBC 5.2 4.0 - 10.5 K/uL   RBC 3.32 (L) 4.22 - 5.81 MIL/uL   Hemoglobin 9.5 (L) 13.0 - 17.0 g/dL   HCT 31.4 (L) 39.0 - 52.0 %   MCV 94.6 78.0 - 100.0 fL   MCH 28.6 26.0 - 34.0 pg   MCHC 30.3 30.0 - 36.0 g/dL   RDW 16.1 (H) 11.5 - 15.5 %   Platelets 150 150 - 400 K/uL  Glucose, capillary  Result Value Ref Range   Glucose-Capillary 198 (H) 65 - 99 mg/dL   Comment 1 Notify RN   Glucose, capillary  Result Value Ref Range   Glucose-Capillary 157 (H) 65 - 99 mg/dL   Comment 1 Notify RN   CBC  Result Value Ref Range   WBC 5.3 4.0 - 10.5 K/uL   RBC 3.28 (L) 4.22 - 5.81 MIL/uL   Hemoglobin 9.4 (L) 13.0 - 17.0 g/dL   HCT 31.4 (L) 39.0 - 52.0 %   MCV 95.7 78.0 - 100.0 fL   MCH 28.7 26.0 - 34.0 pg   MCHC 29.9 (L) 30.0 - 36.0 g/dL   RDW 16.0 (H) 11.5 - 15.5 %   Platelets 153 150 - 400 K/uL  Glucose, capillary   Result Value Ref Range   Glucose-Capillary 187 (H) 65 - 99 mg/dL  Glucose, capillary  Result Value Ref Range  Glucose-Capillary 173 (H) 65 - 99 mg/dL  Glucose, capillary  Result Value Ref Range   Glucose-Capillary 197 (H) 65 - 99 mg/dL    Dg Chest 1 View  Result Date: 04/09/2018 CLINICAL DATA:  Shortness of breath. EXAM: CHEST  1 VIEW COMPARISON:  04/01/2018 FINDINGS: Lung volumes are low. There is patchy opacity at both lung bases. This is likely atelectasis. Pneumonia is possible. No convincing pulmonary edema. No apparent pleural effusion and no pneumothorax. Cardiac silhouette is partly obscured but grossly normal in size. No mediastinal or hilar masses. There stable changes from a thoracic and cervical spine fusion. IMPRESSION: 1. Bibasilar lung opacities most likely due to atelectasis accentuated by low lung volumes. Pneumonia is possible. No convincing pulmonary edema. Electronically Signed   By: Lajean Manes M.D.   On: 04/09/2018 15:01   Dg Chest 2 View  Result Date: 04/01/2018 CLINICAL DATA:  Preop for laminectomy EXAM: CHEST - 2 VIEW COMPARISON:  Chest x-ray of 03/02/2017 FINDINGS: The lungs are not well aerated and there is some atelectasis at the lung bases left-greater-than-right. Follow-up is recommended. No pleural effusion is seen. Mediastinal and vhilar contours are unremarkable and heart size is stable. Posterior hardware for fusion of the lower thoracic spine is stable. IMPRESSION: Suboptimal inspiration with basilar atelectasis left-greater-than-right. Recommend follow-up. Electronically Signed   By: Ivar Drape M.D.   On: 04/01/2018 14:39   Dg Lumbar Spine 2-3 Views  Result Date: 04/08/2018 CLINICAL DATA:  Status post foraminotomies EXAM: LUMBAR SPINE - 2-3 VIEW COMPARISON:  Lumbar CT November 12, 2017 FLUOROSCOPY TIME:  1 minutes 15 seconds; 2 acquired images FINDINGS: Frontal and lateral views obtained. There is posterior screw and plate fixation at L4 and L5 bilaterally  with screw tips in the respective vertebral bodies. No fracture spondylolisthesis. There is marked disc space narrowing at L2-3 and L3-4 with moderate disc space narrowing at L4-5. IMPRESSION: Postoperative changes at L4 and L5 with screw tips in the respective vertebral bodies. Multilevel osteoarthritic change. No fracture or spondylolisthesis. Electronically Signed   By: Lowella Grip III M.D.   On: 04/08/2018 12:07   Dg Thoracic Spine 1 View  Result Date: 04/08/2018 CLINICAL DATA:  Thoracic laminectomy EXAM: THORACIC SPINE - 1 VIEW; DG C-ARM 61-120 MIN COMPARISON:  None. FLUOROSCOPY TIME:  Radiation Exposure Index (as provided by the fluoroscopic device): Not available If the device does not provide the exposure index: Fluoroscopy Time:  2 seconds Number of Acquired Images:  1 FINDINGS: Surgical retractors are noted over the T1 and T2 vertebra. Postsurgical changes in the lower cervical spine are seen. IMPRESSION: Thoracic localization intraoperatively. Electronically Signed   By: Inez Catalina M.D.   On: 04/08/2018 16:11   Dg Chest Port 1 View  Result Date: 04/15/2018 CLINICAL DATA:  Respiratory failure. EXAM: PORTABLE CHEST 1 VIEW COMPARISON:  04/14/2018 FINDINGS: Redemonstration of cardiomegaly and aortic atherosclerosis with atelectasis in both lower lungs right worse than left. There may be slightly improved aeration at the left lung base. No worsening or new finding. IMPRESSION: Persistent lower lung atelectasis right more than left. Possible slight improved aeration at the left base. Electronically Signed   By: Nelson Chimes M.D.   On: 04/15/2018 09:24   Dg Chest Port 1 View  Result Date: 04/14/2018 CLINICAL DATA:  Respiratory failure.  Recent spine surgery. EXAM: PORTABLE CHEST 1 VIEW COMPARISON:  04/12/2018 FINDINGS: Persistent and slightly worsened atelectasis in both lung bases. Upper lungs remain largely clear. This could be simple atelectasis or  atelectatic pneumonia. No visible  effusion. Healing rib fracture on the left of the fifth rib. IMPRESSION: Persistent and slightly worsened atelectasis at both lung bases. Electronically Signed   By: Nelson Chimes M.D.   On: 04/14/2018 09:42   Dg Chest Port 1 View  Result Date: 04/12/2018 CLINICAL DATA:  Atelectasis. EXAM: PORTABLE CHEST 1 VIEW COMPARISON:  One-view chest x-ray 04/11/2018 FINDINGS: Heart is enlarged. Size is exaggerated by low lung volumes. Mild pulmonary vascular congestion is stable. Bibasilar airspace disease is unchanged. No new airspace disease is present. A right pleural effusion is suspected. IMPRESSION: 1. Stable low lung volumes and bibasilar airspace disease, likely atelectasis. Electronically Signed   By: San Morelle M.D.   On: 04/12/2018 07:21   Dg Chest Port 1 View  Result Date: 04/11/2018 CLINICAL DATA:  Shortness of breath EXAM: PORTABLE CHEST 1 VIEW COMPARISON:  April 09, 2018 FINDINGS: There is patchy airspace consolidation in both lung bases with small pleural effusions bilaterally. There is no appreciable interstitial edema. There is cardiomegaly with pulmonary venous hypertension. There is aortic atherosclerosis. There is postoperative change in the lower cervical and lower thoracic regions. IMPRESSION: There is a degree of pulmonary vascular congestion with small pleural effusions bilaterally. There is no appreciable interstitial edema. There is, however, bilateral airspace consolidation, likely pneumonia. There is aortic atherosclerosis. Areas of postoperative change noted. Aortic Atherosclerosis (ICD10-I70.0). Electronically Signed   By: Lowella Grip III M.D.   On: 04/11/2018 16:17   Dg C-arm 1-60 Min  Result Date: 04/08/2018 CLINICAL DATA:  Thoracic laminectomy EXAM: THORACIC SPINE - 1 VIEW; DG C-ARM 61-120 MIN COMPARISON:  None. FLUOROSCOPY TIME:  Radiation Exposure Index (as provided by the fluoroscopic device): Not available If the device does not provide the exposure index:  Fluoroscopy Time:  2 seconds Number of Acquired Images:  1 FINDINGS: Surgical retractors are noted over the T1 and T2 vertebra. Postsurgical changes in the lower cervical spine are seen. IMPRESSION: Thoracic localization intraoperatively. Electronically Signed   By: Inez Catalina M.D.   On: 04/08/2018 16:11    Antibiotics:  Anti-infectives (From admission, onward)   Start     Dose/Rate Route Frequency Ordered Stop   04/13/18 1030  cefdinir (OMNICEF) capsule 300 mg     300 mg Oral Every 12 hours 04/13/18 1015 04/14/18 0923   04/12/18 1400  vancomycin (VANCOCIN) 1,500 mg in sodium chloride 0.9 % 500 mL IVPB  Status:  Discontinued     1,500 mg 250 mL/hr over 120 Minutes Intravenous Every 24 hours 04/11/18 1316 04/12/18 1011   04/11/18 1400  vancomycin (VANCOCIN) 2,500 mg in sodium chloride 0.9 % 500 mL IVPB     2,500 mg 250 mL/hr over 120 Minutes Intravenous  Once 04/11/18 1315 04/11/18 1627   04/11/18 1330  piperacillin-tazobactam (ZOSYN) IVPB 3.375 g  Status:  Discontinued     3.375 g 12.5 mL/hr over 240 Minutes Intravenous Every 8 hours 04/11/18 1314 04/13/18 1015   04/08/18 1800  ceFAZolin (ANCEF) IVPB 2g/100 mL premix     2 g 200 mL/hr over 30 Minutes Intravenous Every 8 hours 04/08/18 1732 04/09/18 0524   04/08/18 1118  vancomycin (VANCOCIN) powder  Status:  Discontinued       As needed 04/08/18 1120 04/08/18 1338   04/08/18 0936  bacitracin 50,000 Units in sodium chloride 0.9 % 500 mL irrigation  Status:  Discontinued       As needed 04/08/18 0936 04/08/18 1338   04/08/18 0606  ceFAZolin (ANCEF) 2-4  GM/100ML-% IVPB  Status:  Discontinued    Note to Pharmacy:  Beverly Gust   : cabinet override      04/08/18 0606 04/08/18 0628   04/08/18 0603  ceFAZolin (ANCEF) 3 g in dextrose 5 % 50 mL IVPB     3 g 130 mL/hr over 30 Minutes Intravenous On call to O.R. 04/08/18 0603 04/08/18 1233      Discharge Exam: Blood pressure (!) 161/121, pulse 62, temperature 98.9 F (37.2 C), temperature  source Oral, resp. rate (!) 30, height 6\' 1"  (1.854 m), weight 135.3 kg (298 lb 4.5 oz), SpO2 98 %. Grossly myelopathic in the lower extremities with proximal with a distal weakness and hyperreflexia, incisions okay   Discharge Medications:   Allergies as of 04/15/2018      Reactions   Other Other (See Comments)   ANESTHESIA UNSPECIFIED  -- makes sick, must have antinausea medication in advance.   Levofloxacin In D5w Itching   Possible infusion reaction 03/29/17. Tolerating oral Levaquin 03/30/17      Medication List    TAKE these medications   aspirin 81 MG EC tablet Take 1 tablet (81 mg total) by mouth daily.   baclofen 10 MG tablet Commonly known as:  LIORESAL Take 1 tablet (10 mg total) by mouth 3 (three) times daily. For muscle spasms   ciprofloxacin 500 MG tablet Commonly known as:  CIPRO 500 mg 2 (two) times daily. X 10 days   escitalopram 10 MG tablet Commonly known as:  LEXAPRO Take 1 tablet (10 mg total) by mouth daily.   gabapentin 400 MG capsule Commonly known as:  NEURONTIN TAKE 1 CAPSULE (400 MG TOTAL) BY MOUTH EVERY 8 (EIGHT) HOURS.   glimepiride 1 MG tablet Commonly known as:  AMARYL Take 1 tablet (1 mg total) by mouth daily with breakfast.   hydrALAZINE 50 MG tablet Commonly known as:  APRESOLINE Take 1 tablet (50 mg total) by mouth every 8 (eight) hours.   levothyroxine 175 MCG tablet Commonly known as:  SYNTHROID, LEVOTHROID Take 1 tablet (175 mcg total) by mouth daily.   losartan 50 MG tablet Commonly known as:  COZAAR Take 50 mg by mouth daily.   polyethylene glycol packet Commonly known as:  MIRALAX / GLYCOLAX Take 17 g by mouth daily as needed. For constipation   potassium chloride 10 MEQ tablet Commonly known as:  KLOR-CON 10 Take 1 tablet (10 mEq total) by mouth 2 (two) times daily.   pramipexole 0.5 MG tablet Commonly known as:  MIRAPEX Take 1 tablet (0.5 mg total) by mouth 2 (two) times daily.   tamsulosin 0.4 MG Caps  capsule Commonly known as:  FLOMAX Take 1 capsule (0.4 mg total) by mouth daily.   torsemide 20 MG tablet Commonly known as:  DEMADEX 1 tablet twice daily and 1 extra tablet daily as needed for swelling   TOVIAZ 8 MG Tb24 tablet Generic drug:  fesoterodine Take 1 tablet (8 mg total) by mouth daily.            Durable Medical Equipment  (From admission, onward)        Start     Ordered   04/08/18 1733  DME Walker rolling  Once    Question:  Patient needs a walker to treat with the following condition  Answer:  S/P lumbar fusion   04/08/18 1732   04/08/18 1733  DME 3 n 1  Once     04/08/18 1732      Disposition:  To inpatient rehabilitation  Final Dx: Decompression and fusion L4-5, decompression T1-T2, congestive heart failure with pulmonary edema, severe myelopathy with gait disturbance  Discharge Instructions    Call MD for:  difficulty breathing, headache or visual disturbances   Complete by:  As directed    Call MD for:  persistant nausea and vomiting   Complete by:  As directed    Call MD for:  redness, tenderness, or signs of infection (pain, swelling, redness, odor or green/yellow discharge around incision site)   Complete by:  As directed    Call MD for:  severe uncontrolled pain   Complete by:  As directed    Call MD for:  temperature >100.4   Complete by:  As directed    Diet - low sodium heart healthy   Complete by:  As directed    Increase activity slowly   Complete by:  As directed          Signed: Saniah Schroeter S 04/15/2018, 12:25 PM

## 2018-04-15 NOTE — Progress Notes (Addendum)
PULMONARY / CRITICAL CARE MEDICINE   Name: Travis [name] MRN: [id number] DOB: [birth year]    ADMISSION DATE:  04/08/2018 CONSULTATION DATE:  04/09/18  REFERRING MD:  Hoyt Koch  CHIEF COMPLAINT:  Acute resp , acidosis, altered mental status  HISTORY OF PRESENT ILLNESS:    72 year old male patient with history of chronic diastolic heart failure, obstructive sleep apnea, mild pulmonary artery hypertension.  Status post decompressive lumbar laminectomy on 4/12.  Critical care asked to see in consult postoperatively due to altered sensorium and progressive hypercarbic respiratory failure  Subjective Feels better, alert and appropriate Weaned to 3 L [place] Negative 5.5 L since admit Afebrile   Objective VITAL SIGNS: BP (!) 146/78   Pulse 73   Temp 98.9 F (37.2 C) (Oral)   Resp (!) 22   Ht 6\' 1"  (1.854 m)   Wt 298 lb 4.5 oz (135.3 kg)   SpO2 92%   BMI 39.35 kg/m      VENTILATOR SETTINGS:    INTAKE / OUTPUT:  Intake/Output Summary (Last 24 hours) at 04/15/2018 1026 Last data filed at 04/15/2018 0810 Gross per 24 hour  Intake 240 ml  Output 1931 ml  Net -1691 ml   PHYSICAL EXAMINATION: General: 72 year old male awake and alert, supine in bed, In NAD HEENT normocephalic atraumatic no JVD , MM Pink and moist Pulmonary: Diminished bases no accessory use Cardiac: S1, S2, Regular rate and rhythm, NO RMG Abdomen: Soft nontender, ND,  Obese, BS + Extremities: Brisk cap refill, lower extremity edema chronic.  Warm, strong pulses. Neuro: Alert and  Oriented x 3 no focal deficits., MAE x 4  LABS:  BMET Recent Labs  Lab 04/12/18 0502 04/13/18 0453 04/14/18 1239  NA 140 140 142  K 4.9 4.4 4.3  CL 103 102 104  CO2 29 27 29   BUN 67* 67* 50*  CREATININE 2.74* 2.28* 1.39*  GLUCOSE 157* 185* 188*   Electrolytes Recent Labs  Lab 04/10/18 0405  04/12/18 0502 04/13/18 0453 04/14/18 1239  CALCIUM 8.2*   < > 8.4* 8.5* 8.4*  MG 2.2  --   --   --   --   PHOS 5.3*  --   --    --   --    < > = values in this interval not displayed.   CBC Recent Labs  Lab 04/11/18 1406 04/14/18 1239 04/15/18 0338  WBC 9.9 5.2 5.3  HGB 10.6* 9.5* 9.4*  HCT 35.0* 31.4* 31.4*  PLT 153 150 153   Coag's No results for input(s): APTT, INR in the last 168 hours.  Sepsis Markers Recent Labs  Lab 04/11/18 1406 04/12/18 0502 04/13/18 0453  LATICACIDVEN 0.9  --   --   PROCALCITON <0.10 0.11 <0.10   ABG Recent Labs  Lab 04/09/18 2132 04/10/18 0300 04/11/18 0359  PHART 7.267* 7.290* 7.301*  PCO2ART 69.3* 64.7* 65.5*  PO2ART 85.6 96.3 69.2*   Liver Enzymes No results for input(s): AST, ALT, ALKPHOS, BILITOT, ALBUMIN in the last 168 hours.  Cardiac Enzymes No results for input(s): TROPONINI, PROBNP in the last 168 hours.  Glucose Recent Labs  Lab 04/13/18 2146 04/14/18 0809 04/14/18 1127 04/14/18 1500 04/14/18 2154 04/15/18 0737  GLUCAP 170* 173* 198* 157* 187* 173*   Imaging Dg Chest Port 1 View  Result Date: 04/15/2018 CLINICAL DATA:  Respiratory failure. EXAM: PORTABLE CHEST 1 VIEW COMPARISON:  04/14/2018 FINDINGS: Redemonstration of cardiomegaly and aortic atherosclerosis with atelectasis in both lower lungs right worse than left. There may be  slightly improved aeration at the left lung base. No worsening or new finding. IMPRESSION: Persistent lower lung atelectasis right more than left. Possible slight improved aeration at the left base. Electronically Signed   By: Nelson Chimes M.D.   On: 04/15/2018 09:24   Cultures UC 4/14>>> negative Blood 4/14>>>  Antibiotics vanc 4/14>>> stop 4/15 Zosyn 4/14>>> -4/16 Ceftinir 4/16>> 4/17  ASSESSMENT / PLAN:  Acute encephalopathy in the setting of hypercarbia and residual anesthesia,   possibly sepsis and fever related; Less confused 4/18 Plan Supportive care Neuro checks per unit routine  Acute on chronic hypercarbic respiratory failure in the setting of residual anesthesia, atelectasis, and  hypoventilation. History of obstructive sleep apnea +/- obesity hypoventilation syndrome CXR with atelectasis, slightly improved over 4/17 Decreased oxygen demand now to 3 L  Baseline CO2 calculates around 50 Portable chest x-ray obtained on 4/15: demonstrates low volume, probable atelectasis, CXR less atelectasis Diuresed well 5 L last 48 hours Good response to Lasix 4/17 Saturation goal is 88-92% Will need ambulatory saturation prior to discharge to evaluate for O2 needs.  Plan Trend CXR Mobilize/ OOB to chair with PT/OT Agressive Pulmonary Toilet IS Q 1 while awake Wean oxygen as able PT/OT Continue BiPAP at at bedtime  SIRS w/ rigors. ? UTI vs atelectatic pna -All culture data negative.  Pro-calcitonin was negative.  He had mild pyuria. T Max 99.3 Oral Omnicef x 4 days completed Plan Trend CBC and fever curve CXR  prn Culture as is clinically indicated Trend micro for results Condom cath  Diastolic heart failure; now hypertensive Recent ECHO showed a LVEF of 65-70%. There was evidence of grade 2 diastolic dysfxn Net negative 5 L after aggressive diuresis 4/17  Plan Added Lopressor 25 mg BID 4/17 Diuresis as renal function allows CXR prn   Acute kidney injury Underlying chronic kidney disease, stage III.   baseline creatinine around 1.32.   His creatinine increased to greater than 2.7   Plan BMET now Consider additional lasix if  Creatinine ok Trend BMET daily Monitor UO  Anemia  -Hemoglobin holding stable Plan Continue SCDs Trend CBC  Diabetes Plan SSI CBG Q 4  Hypothyroidism  plan Synthroid   DVT prophylaxis: scd SUP: na Diet: reg Activity: OOB Disposition : ICU   Weaned to 3 L  after good diuresis 4/17.  More alert, using IS every hour. CXR with improving atelectasis.PT/OT following. Needs continued aggressive Pulmonary Toilet and mobilization for atelectasis. OK to transfer to rehab. If ok with neurosurgery.Will need continued  aggressive pulmonary toilet once transferred with IS every hour while awake and mobilization/ PT/OT. Diuresis recommendations  per home regimen/ cards.   Magdalen Spatz, AGACNP-BC [place] Pager  # [phone] if no answer 04/15/2018, 10:26 AM  [place]. Pager: [phone].

## 2018-04-15 NOTE — Progress Notes (Signed)
Patient vomited x1 during bath, pt states nausea due to pain during movement in chair. Pt transferred from chair to bed via [facility] and patient now denies nausea. Held off on BiPAP at this time d/t vomiting. O2 sats 95% on 4L [place].

## 2018-04-15 NOTE — Progress Notes (Signed)
Rehab admissions - After reading Dr. Rosezella [place] note from today, rehab MD has decided to hold on admission today and reassess for admission to inpatient rehab tomorrow.  I will follow up again tomorrow for medical readiness.  Call me for questions.  #[phone]

## 2018-04-15 NOTE — Progress Notes (Signed)
Rehab admissions - patient has been cleared by pulmonary and by neurosurgeon for acute inpatient rehab admission for today.  Bed available and will admit to acute inpatient rehab today.  Call me for questions.  #[phone]

## 2018-04-15 NOTE — Progress Notes (Addendum)
Progress Note  Patient Name: Travis [name] Date of Encounter: 04/15/2018  Primary Cardiologist: Mertie Moores, MD   Subjective   Feeling better. Anticipating discharge to rehab possibly today. Breathing is improved and he was able to sleep with CPAP overnight. Did have increase in back pain with one episode of emesis overnight. Denies CP or palpitations.   Inpatient Medications    Scheduled Meds: . escitalopram  10 mg Oral Daily  . fesoterodine  8 mg Oral Daily  . furosemide  80 mg Intravenous BID  . gabapentin  400 mg Oral TID  . insulin aspart  0-15 Units Subcutaneous TID WC  . levothyroxine  175 mcg Oral QAC breakfast  . metoprolol tartrate  25 mg Oral BID  . polyethylene glycol  17 g Oral Daily  . pramipexole  0.5 mg Oral BID  . senna  1 tablet Oral BID  . tamsulosin  0.4 mg Oral Daily   Continuous Infusions:  PRN Meds: acetaminophen **OR** acetaminophen, fentaNYL (SUBLIMAZE) injection, menthol-cetylpyridinium **OR** phenol, ondansetron **OR** ondansetron (ZOFRAN) IV   Vital Signs    Vitals:   04/15/18 0600 04/15/18 0710 04/15/18 0800 04/15/18 0900  BP: 136/66  (!) 143/68 (!) 146/78  Pulse: 72  73 73  Resp: 20  20 (!) 22  Temp:  98.9 F (37.2 C)    TempSrc:  Oral    SpO2: 91%  (!) 89% 92%  Weight:      Height:        Intake/Output Summary (Last 24 hours) at 04/15/2018 1151 Last data filed at 04/15/2018 0810 Gross per 24 hour  Intake 240 ml  Output 1911 ml  Net -1671 ml   Filed Weights   04/11/18 0305 04/14/18 0500 04/15/18 0500  Weight: (!) 306 lb 10.6 oz (139.1 kg) (!) 305 lb 12.5 oz (138.7 kg) 298 lb 4.5 oz (135.3 kg)    Telemetry    Sinus rhythm with occasional PVCs - Personally Reviewed   Physical Exam   GEN: sitting in bedside chair in no acute distress.   Neck: difficult to assess JVD, no carotid bruits Cardiac: RRR, no murmurs, rubs, or gallops.  Respiratory: still with some crackles at lung bases; no wheezing or rhonchi GI: NABS,  Soft, nontender, non-distended  MS: 2-3+ LE edema; No deformity. Neuro:  Nonfocal, moving all extremities spontaneously Psych: Normal affect   Labs    Chemistry Recent Labs  Lab 04/12/18 0502 04/13/18 0453 04/14/18 1239  NA 140 140 142  K 4.9 4.4 4.3  CL 103 102 104  CO2 29 27 29   GLUCOSE 157* 185* 188*  BUN 67* 67* 50*  CREATININE 2.74* 2.28* 1.39*  CALCIUM 8.4* 8.5* 8.4*  GFRNONAA 21* 27* 49*  GFRAA 25* 31* 56*  ANIONGAP 8 11 9      Hematology Recent Labs  Lab 04/11/18 1406 04/14/18 1239 04/15/18 0338  WBC 9.9 5.2 5.3  RBC 3.66* 3.32* 3.28*  HGB 10.6* 9.5* 9.4*  HCT 35.0* 31.4* 31.4*  MCV 95.6 94.6 95.7  MCH 29.0 28.6 28.7  MCHC 30.3 30.3 29.9*  RDW 16.8* 16.1* 16.0*  PLT 153 150 153    Cardiac EnzymesNo results for input(s): TROPONINI in the last 168 hours. No results for input(s): TROPIPOC in the last 168 hours.   BNP Recent Labs  Lab 04/10/18 0405 04/11/18 0407  BNP 238.1* 120.9*     DDimer No results for input(s): DDIMER in the last 168 hours.   Radiology    Dg Chest [facility]  1 View  Result Date: 04/15/2018 CLINICAL DATA:  Respiratory failure. EXAM: PORTABLE CHEST 1 VIEW COMPARISON:  04/14/2018 FINDINGS: Redemonstration of cardiomegaly and aortic atherosclerosis with atelectasis in both lower lungs right worse than left. There may be slightly improved aeration at the left lung base. No worsening or new finding. IMPRESSION: Persistent lower lung atelectasis right more than left. Possible slight improved aeration at the left base. Electronically Signed   By: Nelson Chimes M.D.   On: 04/15/2018 09:24   Dg Chest Port 1 View  Result Date: 04/14/2018 CLINICAL DATA:  Respiratory failure.  Recent spine surgery. EXAM: PORTABLE CHEST 1 VIEW COMPARISON:  04/12/2018 FINDINGS: Persistent and slightly worsened atelectasis in both lung bases. Upper lungs remain largely clear. This could be simple atelectasis or atelectatic pneumonia. No visible effusion. Healing rib  fracture on the left of the fifth rib. IMPRESSION: Persistent and slightly worsened atelectasis at both lung bases. Electronically Signed   By: Nelson Chimes M.D.   On: 04/14/2018 09:42    Cardiac Studies    Echocardiogram 03/06/18: Study Conclusions  - Left ventricle: The cavity size was normal. Wall thickness was increased in a pattern of mild LVH. Systolic function was vigorous. The estimated ejection fraction was in the range of 65% to 70%. Wall motion was normal; there were no regional wall motion abnormalities. Doppler parameters are consistent with pseudonormal left ventricular relaxation (grade 2 diastolic dysfunction). The E/A ratio is >1.5. The E/e&' ratio is >15, suggesting elevated LV filling pressure. - Left atrium: The atrium was mildly dilated. - Inferior vena cava: The vessel was normal in size. The respirophasic diameter changes were in the normal range (>= 50%), consistent with normal central venous pressure.  Impressions:  - Compared to a prior study in 2016, there is no significant change. LV filling pressure is elevated.    Patient Profile     72 y.o.malewith chronic diastolic heart failure, OSA, hypertension, diabetes and mild pulmonary hypertension admitted 04/08/18 for back surgery and developed increased shortness of breath post operatively.   Assessment & Plan    1. Acute respiratory failure: multifactorial in the setting of acute hypercarbic respiratory failure and acute on chronic diastolic CHF requiring BiPAP therapy. Weaned to 3L O2 via [place] today. CXR today with improved aeration. Net UOP -1.9L in the last 24 hours; -5.4L this admission. Cr peaked at 2.7; pending today but improved to 1.39 yesterday. Still with crackles on exam and significant LE edema. - Anticipating discharge to rehab soon, however would likely benefit from additional IV diuresis given persistent O2 demand, crackles on lung exam, and significant LE edema. Worry  about acute decompensation with transition to po. May need to observe off IV diuresis prior to discharge to rehab - Will need to continue po diuretics at discharge; previously on torsemide 20mg  daily but will likely need a higher dose  - Continue to monitor strict I&Os and daily weights  2. HTN: BP mildly improved from yesterday after restarting metoprolol. Hydralazine and losartan on hold - Continue to monitor BP closely with diuresis - Will restart hydralazine at 25mg  TID - can titrate back up to home dose 50mg  TID as needed - Continue to hold losartan as AKI improves  3. Acute on chronic renal insufficiency stage III: Cr peaked at 2.7; improved to 1.39 yesterday.  - Continue to monitor closely with diuresis    For questions or updates, please contact [place] Please consult [email or web address] for contact info under Cardiology/STEMI.  Signed, Abigail Butts, [name]  04/15/2018, 11:51 AM   [phone]   History and all data above reviewed.  Patient examined.  I agree with the findings as above.  He is breathing better today.  No acute SOB.  Was able to stand with rehab.    The patient exam reveals COR:RRR  ,  Lungs: Clear  ,  Abd: Positive bowel sounds, no rebound no guarding, Ext No edema  .  All available labs, radiology testing, previous records reviewed. Agree with documented assessment and plan. Acute on chronic diastolic HF. Agree with continuing IV Lasix.  We will follow him in rehab.     Jeneen Rinks Namita [place]  1:06 PM  04/15/2018

## 2018-04-15 NOTE — PMR Pre-admission (Addendum)
PMR Admission Coordinator Pre-Admission Assessment  Patient: Travis [name] is an 72 y.o., male MRN: [id number] DOB: 06-06-1946 Height: 6\' 1"  (185.4 cm) Weight: 135.1 kg (297 lb 13.5 oz)            Insurance Information HMO:      PPO: Yes     PCP:       IPA:       80/20:       OTHER:  Group # [id number] PRIMARY: BCBS medicare      Policy#: [id number]      Subscriber: [place] Name: Travis [name]      Phone#: [phone]     Fax#: [phone] Pre-Cert#:                                       Employer: Retired Benefits:  Phone #: [phone]     Name:  On line Eff. Date: 12-29-17     Deduct: $0      Out of Pocket Max: $5900 [phone])      Life Max: [name] CIR: $310 days 1-6      SNF: $0 days 1-20; $172 days 21-60; $0 days 61-100 Outpatient:  Medical necessity     Co-Pay: $40/visit Home Health: 100%      Co-Pay: none DME: 80%     Co-Pay: 20% Providers: in network  Emergency Contact Information Contact Information    Name Relation Home Work Mobile   Travis [name],Travis [name] Spouse [phone]  [phone]   Travis [name],Travis [name] Daughter   [phone]   Tedric, Leeth   [phone]     Current Medical History  Patient Admitting Diagnosis: Cervical and Lumbar myelopathy s/p decompression/arthrodesis  History of Present Illness: A 72 y.o. 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  History taken from chart review and patient. He started declining few months ago with paraparesis, inability to walk or perform ADLs due to severe recurrent lumbar stenosis L4/5 and thoracic spinal stenosis T1-T2. He was admitted on 04/08/18 for decompressive lumbar lami with arthrodesis L4/5 and partial C7 and full T1 laminectomy with medial facetectomy and foraminotomy by Dr. Ronnald Ramp. Post op is already showing some mprovement in BLE strength  with ability to activate quads as well as PF/DF. Therapy evaluations to be done t4/15/19. CIR recommended in anticipation of extensive rehab needs.  Patient was independent and walking household distances until Dec 2018. He started declining early this year with bladder incontinence and became wheelchair bound. He could perform stand pivot transfers with help. Able to feed self but needed assistance with all other tasks.  Son moved in with them to assist and he has an aide 3X a week to help with B/D. Family supportive and can assist after discharge.    Past Medical History  Past Medical History:  Diagnosis Date  . Cervical myelopathy (HCC)    with myelomalacia at C5-6 and C3-4  . CHF (congestive heart failure) ([place])   . Constipation - functional    controlled with miralax  . COPD (chronic obstructive pulmonary disease) (HCC)    mild  . DJD (degenerative joint disease)    WHOLE SPINE  . Dyspnea   . Gait disorder   . Heart murmur    years ago   . Hypertension   .  Hypothyroidism    Low d/t treatment of hyperthyroidism  . 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 Peripheral neuropathy   . Pneumonia   . PONV (postoperative nausea and vomiting)   . RBBB   . Renal disorder    acute kidney failure  . Restless legs syndrome 03/14/2015  . RLS (restless legs syndrome)   . Sleep apnea    DX  2009/2010 study done at [facility]  . 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 Type II diabetes mellitus (HCC)    diet controlled    Family History  family history includes CAD in his unknown relative; Diabetes in his father; Heart disease in his father; Hypertension in his father; Polycythemia in his mother.  Prior Rehab/Hospitalizations: Has been receiving [facility] PT, aide and RN 2-3 times a week for the past 6 weeks.  Has the patient had major surgery during 100 days prior to admission? No  Current Medications   Current Facility-Administered Medications:  .  acetaminophen (TYLENOL) tablet 650  mg, 650 mg, Oral, Q4H PRN, 650 mg at 04/15/18 1425 **OR** acetaminophen (TYLENOL) suppository 650 mg, 650 mg, Rectal, Q4H PRN, Eustace Moore, MD, 650 mg at 04/11/18 1732 .  escitalopram (LEXAPRO) tablet 10 mg, 10 mg, Oral, Daily, Eustace Moore, MD, 10 mg at 04/16/18 0930 .  fentaNYL (SUBLIMAZE) injection 12.5 mcg, 12.5 mcg, Intravenous, Q2H PRN, Magdalen Spatz, NP .  fesoterodine (TOVIAZ) tablet 8 mg, 8 mg, Oral, Daily, Eustace Moore, MD, 8 mg at 04/16/18 0930 .  gabapentin (NEURONTIN) capsule 400 mg, 400 mg, Oral, TID, Eustace Moore, MD, 400 mg at 04/16/18 0930 .  hydrALAZINE (APRESOLINE) tablet 25 mg, 25 mg, Oral, Q8H, Kroeger, Krista M., [name], 25 mg at 04/16/18 0636 .  insulin aspart (novoLOG) injection 0-15 Units, 0-15 Units, Subcutaneous, TID WC, Eustace Moore, MD, 2 Units at 04/16/18 [phone] .  levothyroxine (SYNTHROID, LEVOTHROID) tablet 175 mcg, 175 mcg, Oral, QAC breakfast, Eustace Moore, MD, 175 mcg at 04/16/18 0636 .  MEDLINE mouth rinse, 15 mL, Mouth Rinse, BID, Eustace Moore, MD, 15 mL at 04/16/18 0933 .  menthol-cetylpyridinium (CEPACOL) lozenge 3 mg, 1 lozenge, Oral, PRN **OR** phenol (CHLORASEPTIC) mouth spray 1 spray, 1 spray, Mouth/Throat, PRN, Eustace Moore, MD .  metoprolol tartrate (LOPRESSOR) tablet 25 mg, 25 mg, Oral, BID, Magdalen Spatz, NP, 25 mg at 04/16/18 0930 .  ondansetron (ZOFRAN) tablet 4 mg, 4 mg, Oral, Q6H PRN **OR** ondansetron (ZOFRAN) injection 4 mg, 4 mg, Intravenous, Q6H PRN, Eustace Moore, MD, 4 mg at 04/15/18 0005 .  polyethylene glycol (MIRALAX / GLYCOLAX) packet 17 g, 17 g, Oral, Daily, Kroeger, Krista M., [name], 17 g at 04/16/18 0930 .  pramipexole (MIRAPEX) tablet 0.5 mg, 0.5 mg, Oral, BID, Eustace Moore, MD, 0.5 mg at 04/16/18 0930 .  senna (SENOKOT) tablet 8.6 mg, 1 tablet, Oral, BID, Eustace Moore, MD, 8.6 mg at 04/16/18 0930 .  tamsulosin (FLOMAX) capsule 0.4 mg, 0.4 mg, Oral, Daily, Eustace Moore, MD, 0.4 mg at 04/16/18 0931 .  torsemide  (DEMADEX) tablet 80 mg, 80 mg, Oral, BID, Hochrein, James, MD  Patients Current Diet: Precautions:  No bending, arching, or twisting Diet heart healthy/carb modified Room service appropriate? Yes; Fluid consistency: Thin; Fluid restriction: 1200 mL Fluid Diet - low sodium heart healthy  Precautions / Restrictions Precautions Precautions: Fall, Back, Cervical Precaution Comments: educated pt in back precautions, unable to state Restrictions Weight Bearing Restrictions:  No   Has the patient had 2 or more falls or a fall with injury in the past year?No  Prior Activity Level Limited Community (1-2x/wk): Went out 3-4 times a week.  Wife does the driving.  Home Assistive Devices / Equipment Home Assistive Devices/Equipment: Wheelchair, Environmental consultant (specify type), CBG Meter, Eyeglasses, Cane (specify quad or straight), Dentures (specify type), Grab bars in shower, Hand-held shower hose, Shower chair with back Home Equipment: Environmental consultant - 2 wheels, Walker - 4 wheels, [place] - single point, Bedside commode, Wheelchair - manual, Shower seat - built in, [employer] - tub/shower  Prior Device Use: Indicate devices/aids used by the patient prior to current illness, exacerbation or injury? Manual wheelchair  Prior Functional Level Prior Function Level of Independence: Needs assistance Gait / Transfers Assistance Needed: Pt could transfer with assist but has not walked functionally since december ADL's / Homemaking Assistance Needed: Pt can feed self and assist with some UE adls but has assist with all dressing and LE bathing, toileting.  Comments: uses WC and lift chair at home  Self Care: Did the patient need help bathing, dressing, using the toilet or eating?  Needed some help  Indoor Mobility: Did the patient need assistance with walking from room to room (with or without device)? Needed some help  Stairs: Did the patient need assistance with internal or external stairs (with or without device)?  Dependent  Functional Cognition: Did the patient need help planning regular tasks such as shopping or remembering to take medications? Independent  Current Functional Level Cognition  Overall Cognitive Status: Impaired/Different from baseline Current Attention Level: Sustained Orientation Level: Oriented X4 Following Commands: Follows one step commands consistently General Comments: Functional cognition this session.     Extremity Assessment (includes Sensation/Coordination)  Upper Extremity Assessment: RUE deficits/detail, LUE deficits/detail RUE Deficits / Details: ROM WFL  Strength 3+/5 shoulders LUE Deficits / Details: ROM WFL  Strength 3+/5 shoulders  Lower Extremity Assessment: RLE deficits/detail, LLE deficits/detail RLE Deficits / Details: strength 2/5 hip, knee and ankle LLE Deficits / Details: strength 2/5 hip knee and ankle    ADLs  Overall ADL's : Needs assistance/impaired Eating/Feeding: Supervision/ safety, Sitting, Set up Eating/Feeding Details (indicate cue type and reason): pt fatigues quickly.  Needs lid on drink with straw Grooming: Brushing hair, Sitting, Set up Grooming Details (indicate cue type and reason): assist reaching overhead to comb hair Upper Body Bathing: Moderate assistance, Sitting Upper Body Bathing Details (indicate cue type and reason): pt requires supported sitting due to poor balance. Lower Body Bathing: Total assistance, +2 for physical assistance, Sit to/from stand, Cueing for safety, Cueing for compensatory techniques Lower Body Bathing Details (indicate cue type and reason): Pt unable to dress LE at this time due to fatigue, poor balance and inability to reach LEs. Pt may need adaptive equipment at some point. Upper Body Dressing : Minimal assistance, Sitting Lower Body Dressing: Total assistance, Bed level Toilet Transfer: +2 for physical assistance, Maximal assistance(with Stedy) Toilet Transfer Details (indicate cue type and reason):  Using Stedy to simulate from bed to chair.  Toileting- Clothing Manipulation and Hygiene: Total assistance Toileting - Clothing Manipulation Details (indicate cue type and reason): pt with loss of bowel in standing, cleaned Functional mobility during ADLs: Maximal assistance, +2 for physical assistance(Stedy) General ADL Comments: pt remains highly motivated    Mobility  Overal bed mobility: Needs Assistance Bed Mobility: Supine to Sit Rolling: Mod assist, +2 for physical assistance Sidelying to sit: +2 for physical assistance, Max assist  Supine to sit: +2 for physical assistance, Max assist, HOB elevated Sit to supine: (Modified long sitting to supine) General bed mobility comments: max assist for LEs to EOB, mod assist for trunk, max assist to position hips at EOB with bed pad    Transfers  Overall transfer level: Needs assistance Equipment used: (see below) Transfer via Lift Equipment: Marketing executive Transfers: Sit to/from Stand Sit to Stand: Total assist Stand pivot transfers: Max assist, +2 physical assistance General transfer comment: performed x 1 from elevated bed and elevated chair    Ambulation / Gait / Stairs / Wheelchair Mobility  Ambulation/Gait Ambulation/Gait assistance: Max assist, +2 physical assistance Ambulation Distance (Feet): 1 Feet Assistive device: Rolling walker (2 wheeled) Gait Pattern/deviations: Step-to pattern, Decreased step length - right, Decreased step length - left, Decreased dorsiflexion - right, Decreased dorsiflexion - left, Decreased weight shift to right, Decreased weight shift to left General Gait Details: Pt took two very slow steps forward and backward needing Max cues for sequencing and Max A +2 to stay upright and maintain balance.   Gait velocity: reduced    Posture / Balance Dynamic Sitting Balance Sitting balance - Comments: uses B UE support to balance Balance Overall balance assessment: Needs assistance Sitting-balance support:  Bilateral upper extremity supported, Feet supported Sitting balance-Leahy Scale: Poor Sitting balance - Comments: uses B UE support to balance Postural control: Posterior lean Standing balance support: Bilateral upper extremity supported, During functional activity Standing balance-Leahy Scale: Poor Standing balance comment: used sara plus, stood x 2--3 min then 2 min, pt was having BM, cleaned pt with total assist.     Special needs/care consideration BiPAP/CPAP Yes, CPAP CPM No Continuous Drip IV No Dialysis No       Life Vest No Oxygen Yes, on 02 at 3L [place] in hospital, but no 02 at home Special Bed No Trach Size No Wound Vac (area) No     Skin LE edema with leg wraps, post op cervical and lumbar surgical incisions                             Bowel mgmt: Last BM 04/15/18 Bladder mgmt: Condom catheter Diabetic mgmt Yes, on oral medications at home    Previous Home Environment Living Arrangements: Spouse/significant other Available Help at Discharge: Family, Available 24 hours/day, Personal care attendant Type of Home: House Home Layout: One level Home Access: Ramped entrance Bathroom Shower/Tub: Multimedia programmer: Handicapped height Bathroom Accessibility: Yes How Accessible: Accessible via wheelchair Home Care Services: Yes Type of Home Care Services: Homehealth aide, Home PT, [place] (if known): [facility]  Additional Comments: pt had caregivers 3x a week for bathing.  pt's wife was able to assist with transfers and toileting. pt mostly w/c level  Discharge Living Setting Plans for Discharge Living Setting: Patient's home, House, Lives with (comment)(Lives with his wife and a 25 yo son.) Type of Home at Discharge: House Discharge Home Layout: One level Discharge Home Access: Ramped entrance Does the patient have any problems obtaining your medications?: No  Social/Family/Support Systems Patient Roles: Spouse, Parent(Has a wife, 2 sons and 1  daughter.) Contact Information: Micaiah Litle - wife Anticipated Caregiver: Wife  Anticipated Caregiver's Contact Information: [name] - wife - see emergency contacts Ability/Limitations of Caregiver: Wife is retired and can assist.  Son works but can assist when not at work. Caregiver Availability: 24/7 Discharge Plan Discussed with Primary Caregiver: Yes Is Caregiver  In Agreement with Plan?: Yes Does Caregiver/Family have Issues with Lodging/Transportation while Pt is in Rehab?: No  Goals/Additional Needs Patient/Family Goal for Rehab: PT/OT min assist goals Expected length of stay: 17-21 days Cultural Considerations: None Dietary Needs: Heart healthy, carb modified, thin liquids diet Equipment Needs: TBD Pt/Family Agrees to Admission and willing to participate: Yes Program Orientation Provided & Reviewed with Pt/Caregiver Including Roles  & Responsibilities: Yes  Decrease burden of Care through IP rehab admission: [name]  Possible need for SNF placement upon discharge: Potentially  Patient Condition: This patient's medical and functional status has changed since the consult dated: 04/09/18 in which the Rehabilitation Physician determined and documented that the patient's condition is appropriate for intensive rehabilitative care in an inpatient rehabilitation facility. See "History of Present Illness" (above) for medical update. Functional changes are:  Currently requiring max assist +2 to total assist for mobility and ADLs. Patient's medical and functional status update has been discussed with the Rehabilitation physician and patient remains appropriate for inpatient rehabilitation. Will admit to inpatient rehab today.  Preadmission Screen Completed By:  Retta Diones, 04/16/2018 10:24 AM ______________________________________________________________________   Discussed status with Dr. Posey Pronto on 04/16/18 at 1023 and received telephone approval for admission today.  Admission Coordinator:   Retta Diones, time 1024/Date 04/16/18

## 2018-04-15 NOTE — Progress Notes (Signed)
Occupational Therapy Treatment Patient Details Name: Travis [name] MRN: [id number] DOB: 30-Aug-1946 Today's Date: 04/15/2018    History of present illness Pt is a 72 yo male admitted for decompressive lumbar laminectomy with hemifacetectomy and foraminotomy at L4-5 [place].  Pt also had C7-T1 medial facetectomry and foraminotomy.  Pt with PMH of previous lumbar surgeries, COPD, CKD, OSA, CHF, DDD. Pt not on home O2 but on 3L today.     OT comments  Pt remains highly motivated and works hard. Continues to require +2 assist for bed mobility. Stood x 2 with use of sara plus. Pt requiring total assist for pericare after incontinence in standing. Performed seated grooming activity and UE dressing in chair. Pt with stable VS on 6L 02 during exertion, reduced to 4L at end of session. Pt continues to be an excellent CIR candidate.   Follow Up Recommendations  CIR;Supervision/Assistance - 24 hour    Equipment Recommendations  Other (comment)(defer to next venue)    Recommendations for Other Services      Precautions / Restrictions Precautions Precautions: Fall;Back;Cervical Precaution Comments: educated pt in back precautions, unable to state Restrictions Weight Bearing Restrictions: No       Mobility Bed Mobility Overal bed mobility: Needs Assistance Bed Mobility: Supine to Sit     Supine to sit: +2 for physical assistance;Max assist;HOB elevated     General bed mobility comments: max assist for LEs to EOB, mod assist for trunk, max assist to position hips at EOB with bed pad  Transfers Overall transfer level: Needs assistance   Transfers: Sit to/from Stand Sit to Stand: Total assist         General transfer comment: performed x 1 from elevated bed and elevated chair    Balance Overall balance assessment: Needs assistance   Sitting balance-Leahy Scale: Poor Sitting balance - Comments: uses B UE support to balance     Standing balance-Leahy Scale: Poor Standing balance  comment: used sara plus, stood x 2--3 min then 2 min                           ADL either performed or assessed with clinical judgement   ADL Overall ADL's : Needs assistance/impaired     Grooming: Brushing hair;Sitting;Set up           Upper Body Dressing : Minimal assistance;Sitting   Lower Body Dressing: Total assistance;Bed level       Toileting- Clothing Manipulation and Hygiene: Total assistance Toileting - Clothing Manipulation Details (indicate cue type and reason): pt with loss of bowel in standing, cleaned       General ADL Comments: pt remains highly motivated     Manufacturing systems engineer      Cognition Arousal/Alertness: Awake/alert Behavior During Therapy: WFL for tasks assessed/performed Overall Cognitive Status: Impaired/Different from baseline Area of Impairment: Memory                     Memory: Decreased recall of precautions                  Exercises     Shoulder Instructions       General Comments      Pertinent Vitals/ Pain       Pain Assessment: Faces Faces Pain Scale: Hurts a little bit Pain Location: grimaces, grunts with mobility, c/o cervica incision itching Pain Descriptors / Indicators: Grimacing;Guarding;Discomfort  Pain Intervention(s): Monitored during session;Repositioned  Home Living                                          Prior Functioning/Environment              Frequency  Min 3X/week        Progress Toward Goals  OT Goals(current goals can now be found in the care plan section)  Progress towards OT goals: Progressing toward goals  Acute Rehab OT Goals Patient Stated Goal: to walk again OT Goal Formulation: With patient/family Time For Goal Achievement: 04/23/18 Potential to Achieve Goals: Good  Plan Discharge plan remains appropriate    Co-evaluation    PT/OT/SLP Co-Evaluation/Treatment: Yes Reason for Co-Treatment: For  patient/therapist safety   OT goals addressed during session: ADL's and self-care;Strengthening/ROM      AM-PAC PT "6 Clicks" Daily Activity     Outcome Measure   Help from another person eating meals?: None Help from another person taking care of personal grooming?: A Little Help from another person toileting, which includes using toliet, bedpan, or urinal?: Total Help from another person bathing (including washing, rinsing, drying)?: A Lot Help from another person to put on and taking off regular upper body clothing?: A Little Help from another person to put on and taking off regular lower body clothing?: Total 6 Click Score: 14    End of Session Equipment Utilized During Treatment: Oxygen(6L)  OT Visit Diagnosis: Unsteadiness on feet (R26.81);Other abnormalities of gait and mobility (R26.89);Muscle weakness (generalized) (M62.81);Other symptoms and signs involving the nervous system (R29.898)   Activity Tolerance Patient tolerated treatment well   Patient Left in chair;with call bell/phone within reach;with family/visitor present   Nurse Communication Mobility status;Need for lift equipment(pt had BM)        Time: 2010-0712 OT Time Calculation (min): 38 min  Charges: OT General Charges $OT Visit: 1 Visit OT Treatments $Therapeutic Activity: 8-22 mins  04/15/2018 [name], OTR/L Pager: [phone]   [name] Haze Boyden 04/15/2018, 11:49 AM

## 2018-04-15 NOTE — Progress Notes (Signed)
Patient ID: Travis [name], male   DOB: January 10, 1946, 71 y.o.   MRN: [id number] Subjective: Patient reports almost no pain from surgery, still feels legs stronger and less numb than pre-op, denies CP or SOB  Objective: Vital signs in last 24 hours: Temp:  [98.9 F (37.2 C)-99.4 F (37.4 C)] 98.9 F (37.2 C) (04/18 0710) Pulse Rate:  [69-96] 73 (04/18 0900) Resp:  [17-35] 22 (04/18 0900) BP: (131-169)/(58-138) 146/78 (04/18 0900) SpO2:  [82 %-98 %] 92 % (04/18 0900) Weight:  [135.3 kg (298 lb 4.5 oz)] 135.3 kg (298 lb 4.5 oz) (04/18 0500)  Intake/Output from previous day: 04/17 0701 - 04/18 0700 In: 120 [P.O.:120] Out: 1935 [Urine:1935] Intake/Output this shift: Total I/O In: 120 [P.O.:120] Out: 26 [Urine:25; Stool:1]  wounds ok, legs weak and spastic similar to pre-op  Lab Results: Lab Results  Component Value Date   WBC 5.3 04/15/2018   HGB 9.4 (L) 04/15/2018   HCT 31.4 (L) 04/15/2018   MCV 95.7 04/15/2018   PLT 153 04/15/2018   Lab Results  Component Value Date   INR 1.07 03/02/2018   BMET Lab Results  Component Value Date   NA 142 04/14/2018   K 4.3 04/14/2018   CL 104 04/14/2018   CO2 29 04/14/2018   GLUCOSE 188 (H) 04/14/2018   BUN 50 (H) 04/14/2018   CREATININE 1.39 (H) 04/14/2018   CALCIUM 8.4 (L) 04/14/2018    Studies/Results: Dg Chest Port 1 View  Result Date: 04/15/2018 CLINICAL DATA:  Respiratory failure. EXAM: PORTABLE CHEST 1 VIEW COMPARISON:  04/14/2018 FINDINGS: Redemonstration of cardiomegaly and aortic atherosclerosis with atelectasis in both lower lungs right worse than left. There may be slightly improved aeration at the left lung base. No worsening or new finding. IMPRESSION: Persistent lower lung atelectasis right more than left. Possible slight improved aeration at the left base. Electronically Signed   By: Nelson Chimes M.D.   On: 04/15/2018 09:24   Dg Chest Port 1 View  Result Date: 04/14/2018 CLINICAL DATA:  Respiratory failure.   Recent spine surgery. EXAM: PORTABLE CHEST 1 VIEW COMPARISON:  04/12/2018 FINDINGS: Persistent and slightly worsened atelectasis in both lung bases. Upper lungs remain largely clear. This could be simple atelectasis or atelectatic pneumonia. No visible effusion. Healing rib fracture on the left of the fifth rib. IMPRESSION: Persistent and slightly worsened atelectasis at both lung bases. Electronically Signed   By: Nelson Chimes M.D.   On: 04/14/2018 09:42    Assessment/Plan: Stable, mobilize  Estimated body mass index is 39.35 kg/m as calculated from the following:   Height as of this encounter: 6\' 1"  (1.854 m).   Weight as of this encounter: 135.3 kg (298 lb 4.5 oz).    LOS: 7 days    Kasie Leccese S 04/15/2018, 10:17 AM

## 2018-04-15 NOTE — Progress Notes (Signed)
Physical Therapy Treatment Patient Details Name: Travis [name] MRN: [id number] DOB: [birth year] Today's Date: 04/15/2018    History of Present Illness Pt is a 72 yo male admitted for decompressive lumbar laminectomy with hemifacetectomy and foraminotomy at L4-5 Bly.  Pt also had C7-T1 medial facetectomry and foraminotomy.  Pt with PMH of previous lumbar surgeries, COPD, CKD, OSA, CHF, DDD. Pt not on home O2 but on 3L today.      PT Comments    Pt admitted with above diagnosis. Pt currently with functional limitations due to balance and endurance deficits. Pt was able to stand with [name] with max to  total assist for stand and stood 2-3 min with flexed posture but could attempt to stand tall.  Pt could not stand without [name].  Pt supposed to go to REhab today.  Pt will benefit from skilled PT to increase their independence and safety with mobility to allow discharge to the venue listed below.    Follow Up Recommendations  CIR     Equipment Recommendations  None recommended by PT    Recommendations for Other Services Rehab consult     Precautions / Restrictions Precautions Precautions: Fall;Back;Cervical Precaution Comments: educated pt in back precautions, unable to state Restrictions Weight Bearing Restrictions: No    Mobility  Bed Mobility Overal bed mobility: Needs Assistance Bed Mobility: Supine to Sit     Supine to sit: +2 for physical assistance;Max assist;HOB elevated Sit to supine: (Modified long sitting to supine)   General bed mobility comments: max assist for LEs to EOB, mod assist for trunk, max assist to position hips at EOB with bed pad  Transfers Overall transfer level: Needs assistance Equipment used: (see below) Transfers: Sit to/from Stand Sit to Stand: Total assist         General transfer comment: performed x 1 from elevated bed and elevated chair  Ambulation/Gait                 Stairs             Wheelchair  Mobility    Modified Rankin (Stroke Patients Only)       Balance Overall balance assessment: Needs assistance Sitting-balance support: Bilateral upper extremity supported;Feet supported Sitting balance-Leahy Scale: Poor Sitting balance - Comments: uses B UE support to balance Postural control: Posterior lean Standing balance support: Bilateral upper extremity supported;During functional activity Standing balance-Leahy Scale: Poor Standing balance comment: used sara plus, stood x 2--3 min then 2 min, pt was having BM, cleaned pt with total assist.                             Cognition Arousal/Alertness: Awake/alert Behavior During Therapy: WFL for tasks assessed/performed Overall Cognitive Status: Impaired/Different from baseline Area of Impairment: Memory                   Current Attention Level: Sustained Memory: Decreased recall of precautions Following Commands: Follows one step commands consistently   Awareness: Emergent Problem Solving: Slow processing General Comments: Functional cognition this session.       Exercises General Exercises - Lower Extremity Long Arc Quad: AROM;Both;10 reps;Seated    General Comments General comments (skin integrity, edema, etc.): Pt on 4L O2 on arrival with sats >90%.  Had to incr to 6L to keep sats >90%.        Pertinent Vitals/Pain Pain Assessment: Faces Faces Pain Scale: Hurts a little bit Pain  Location: grimaces, grunts with mobility, c/o cervica incision itching Pain Descriptors / Indicators: Grimacing;Guarding;Discomfort Pain Intervention(s): Limited activity within patient's tolerance;Monitored during session;Premedicated before session;Repositioned   VSS on 6LO2 for mobility.  Had to incr to 5L at end of treatment with pt in chair as he would not stay above 90% on 4L once in chair. Other VSS.  Home Living                      Prior Function            PT Goals (current goals can now be  found in the care plan section) Acute Rehab PT Goals Patient Stated Goal: to walk again Progress towards PT goals: Progressing toward goals    Frequency    Min 3X/week      PT Plan Current plan remains appropriate    Co-evaluation PT/OT/SLP Co-Evaluation/Treatment: Yes Reason for Co-Treatment: Complexity of the patient's impairments (multi-system involvement);For patient/therapist safety PT goals addressed during session: Mobility/safety with mobility OT goals addressed during session: ADL's and self-care;Strengthening/ROM      AM-PAC PT "6 Clicks" Daily Activity  Outcome Measure  Difficulty turning over in bed (including adjusting bedclothes, sheets and blankets)?: Unable Difficulty moving from lying on back to sitting on the side of the bed? : Unable Difficulty sitting down on and standing up from a chair with arms (e.g., wheelchair, bedside commode, etc,.)?: Unable Help needed moving to and from a bed to chair (including a wheelchair)?: Total Help needed walking in hospital room?: Total Help needed climbing 3-5 steps with a railing? : Total 6 Click Score: 6    End of Session Equipment Utilized During Treatment: Gait belt;Oxygen Activity Tolerance: Patient limited by fatigue Patient left: in chair;with call bell/phone within reach;with family/visitor present;with chair alarm set Nurse Communication: Mobility status;Need for lift equipment PT Visit Diagnosis: Unsteadiness on feet (R26.81);Muscle weakness (generalized) (M62.81);Difficulty in walking, not elsewhere classified (R26.2)     Time: 0932-3557 PT Time Calculation (min) (ACUTE ONLY): 37 min  Charges:  $Therapeutic Activity: 8-22 mins                    G Codes:       Travis [name],PT Acute Rehabilitation [phone] (pager)    Denice Paradise 04/15/2018, 12:00 PM

## 2018-04-16 ENCOUNTER — Inpatient Hospital Stay (HOSPITAL_COMMUNITY)
Admission: RE | Admit: 2018-04-16 | Discharge: 2018-05-12 | DRG: 559 | Disposition: A | Discharge status: Home Health | Payer: Medicare Other | Source: Intra-hospital | Attending: Physical Medicine & Rehabilitation | Admitting: Physical Medicine & Rehabilitation

## 2018-04-16 ENCOUNTER — Other Ambulatory Visit: Payer: Self-pay

## 2018-04-16 ENCOUNTER — Encounter (HOSPITAL_COMMUNITY): Payer: Self-pay

## 2018-04-16 ENCOUNTER — Inpatient Hospital Stay (HOSPITAL_COMMUNITY): Payer: Medicare Other

## 2018-04-16 ENCOUNTER — Encounter (HOSPITAL_COMMUNITY): Payer: Self-pay | Admitting: *Deleted

## 2018-04-16 DIAGNOSIS — E039 Hypothyroidism, unspecified: Secondary | ICD-10-CM | POA: Diagnosis present

## 2018-04-16 DIAGNOSIS — G931 Anoxic brain damage, not elsewhere classified: Secondary | ICD-10-CM | POA: Diagnosis present

## 2018-04-16 DIAGNOSIS — E1165 Type 2 diabetes mellitus with hyperglycemia: Secondary | ICD-10-CM

## 2018-04-16 DIAGNOSIS — Z6841 Body Mass Index (BMI) 40.0 and over, adult: Secondary | ICD-10-CM

## 2018-04-16 DIAGNOSIS — D696 Thrombocytopenia, unspecified: Secondary | ICD-10-CM | POA: Diagnosis not present

## 2018-04-16 DIAGNOSIS — R0602 Shortness of breath: Secondary | ICD-10-CM

## 2018-04-16 DIAGNOSIS — N390 Urinary tract infection, site not specified: Secondary | ICD-10-CM | POA: Diagnosis not present

## 2018-04-16 DIAGNOSIS — K5900 Constipation, unspecified: Secondary | ICD-10-CM | POA: Diagnosis present

## 2018-04-16 DIAGNOSIS — I5033 Acute on chronic diastolic (congestive) heart failure: Secondary | ICD-10-CM | POA: Diagnosis present

## 2018-04-16 DIAGNOSIS — I10 Essential (primary) hypertension: Secondary | ICD-10-CM

## 2018-04-16 DIAGNOSIS — I272 Pulmonary hypertension, unspecified: Secondary | ICD-10-CM | POA: Diagnosis present

## 2018-04-16 DIAGNOSIS — E876 Hypokalemia: Secondary | ICD-10-CM | POA: Diagnosis not present

## 2018-04-16 DIAGNOSIS — Z884 Allergy status to anesthetic agent status: Secondary | ICD-10-CM

## 2018-04-16 DIAGNOSIS — I130 Hypertensive heart and chronic kidney disease with heart failure and stage 1 through stage 4 chronic kidney disease, or unspecified chronic kidney disease: Secondary | ICD-10-CM | POA: Diagnosis present

## 2018-04-16 DIAGNOSIS — E1142 Type 2 diabetes mellitus with diabetic polyneuropathy: Secondary | ICD-10-CM | POA: Diagnosis present

## 2018-04-16 DIAGNOSIS — G4733 Obstructive sleep apnea (adult) (pediatric): Secondary | ICD-10-CM | POA: Diagnosis present

## 2018-04-16 DIAGNOSIS — K5901 Slow transit constipation: Secondary | ICD-10-CM

## 2018-04-16 DIAGNOSIS — R5381 Other malaise: Secondary | ICD-10-CM | POA: Diagnosis not present

## 2018-04-16 DIAGNOSIS — Z79899 Other long term (current) drug therapy: Secondary | ICD-10-CM

## 2018-04-16 DIAGNOSIS — E1122 Type 2 diabetes mellitus with diabetic chronic kidney disease: Secondary | ICD-10-CM | POA: Diagnosis present

## 2018-04-16 DIAGNOSIS — Z79890 Hormone replacement therapy (postmenopausal): Secondary | ICD-10-CM

## 2018-04-16 DIAGNOSIS — G959 Disease of spinal cord, unspecified: Secondary | ICD-10-CM | POA: Diagnosis not present

## 2018-04-16 DIAGNOSIS — R7309 Other abnormal glucose: Secondary | ICD-10-CM | POA: Diagnosis not present

## 2018-04-16 DIAGNOSIS — N401 Enlarged prostate with lower urinary tract symptoms: Secondary | ICD-10-CM

## 2018-04-16 DIAGNOSIS — F329 Major depressive disorder, single episode, unspecified: Secondary | ICD-10-CM | POA: Diagnosis present

## 2018-04-16 DIAGNOSIS — Z981 Arthrodesis status: Secondary | ICD-10-CM

## 2018-04-16 DIAGNOSIS — B965 Pseudomonas (aeruginosa) (mallei) (pseudomallei) as the cause of diseases classified elsewhere: Secondary | ICD-10-CM | POA: Diagnosis not present

## 2018-04-16 DIAGNOSIS — Z9842 Cataract extraction status, left eye: Secondary | ICD-10-CM

## 2018-04-16 DIAGNOSIS — Z8679 Personal history of other diseases of the circulatory system: Secondary | ICD-10-CM | POA: Diagnosis not present

## 2018-04-16 DIAGNOSIS — R0989 Other specified symptoms and signs involving the circulatory and respiratory systems: Secondary | ICD-10-CM | POA: Diagnosis not present

## 2018-04-16 DIAGNOSIS — R509 Fever, unspecified: Secondary | ICD-10-CM

## 2018-04-16 DIAGNOSIS — Z9981 Dependence on supplemental oxygen: Secondary | ICD-10-CM

## 2018-04-16 DIAGNOSIS — N179 Acute kidney failure, unspecified: Secondary | ICD-10-CM | POA: Diagnosis present

## 2018-04-16 DIAGNOSIS — Z4789 Encounter for other orthopedic aftercare: Secondary | ICD-10-CM | POA: Diagnosis present

## 2018-04-16 DIAGNOSIS — G8220 Paraplegia, unspecified: Secondary | ICD-10-CM | POA: Diagnosis present

## 2018-04-16 DIAGNOSIS — R0902 Hypoxemia: Secondary | ICD-10-CM | POA: Diagnosis not present

## 2018-04-16 DIAGNOSIS — N400 Enlarged prostate without lower urinary tract symptoms: Secondary | ICD-10-CM | POA: Diagnosis present

## 2018-04-16 DIAGNOSIS — R338 Other retention of urine: Secondary | ICD-10-CM

## 2018-04-16 DIAGNOSIS — J9622 Acute and chronic respiratory failure with hypercapnia: Secondary | ICD-10-CM | POA: Diagnosis present

## 2018-04-16 DIAGNOSIS — Z794 Long term (current) use of insulin: Secondary | ICD-10-CM

## 2018-04-16 DIAGNOSIS — D62 Acute posthemorrhagic anemia: Secondary | ICD-10-CM | POA: Diagnosis present

## 2018-04-16 DIAGNOSIS — Z7982 Long term (current) use of aspirin: Secondary | ICD-10-CM

## 2018-04-16 DIAGNOSIS — J449 Chronic obstructive pulmonary disease, unspecified: Secondary | ICD-10-CM | POA: Diagnosis present

## 2018-04-16 DIAGNOSIS — Z87891 Personal history of nicotine dependence: Secondary | ICD-10-CM

## 2018-04-16 DIAGNOSIS — Z881 Allergy status to other antibiotic agents status: Secondary | ICD-10-CM

## 2018-04-16 DIAGNOSIS — I5040 Unspecified combined systolic (congestive) and diastolic (congestive) heart failure: Secondary | ICD-10-CM | POA: Diagnosis not present

## 2018-04-16 DIAGNOSIS — N183 Chronic kidney disease, stage 3 (moderate): Secondary | ICD-10-CM | POA: Diagnosis present

## 2018-04-16 DIAGNOSIS — M7989 Other specified soft tissue disorders: Secondary | ICD-10-CM | POA: Diagnosis not present

## 2018-04-16 DIAGNOSIS — B961 Klebsiella pneumoniae [K. pneumoniae] as the cause of diseases classified elsewhere: Secondary | ICD-10-CM | POA: Diagnosis present

## 2018-04-16 DIAGNOSIS — Z9841 Cataract extraction status, right eye: Secondary | ICD-10-CM

## 2018-04-16 DIAGNOSIS — N170 Acute kidney failure with tubular necrosis: Secondary | ICD-10-CM | POA: Diagnosis not present

## 2018-04-16 DIAGNOSIS — Z1624 Resistance to multiple antibiotics: Secondary | ICD-10-CM | POA: Diagnosis present

## 2018-04-16 DIAGNOSIS — N189 Chronic kidney disease, unspecified: Secondary | ICD-10-CM | POA: Diagnosis not present

## 2018-04-16 DIAGNOSIS — Z9049 Acquired absence of other specified parts of digestive tract: Secondary | ICD-10-CM

## 2018-04-16 DIAGNOSIS — Z8249 Family history of ischemic heart disease and other diseases of the circulatory system: Secondary | ICD-10-CM

## 2018-04-16 DIAGNOSIS — G2581 Restless legs syndrome: Secondary | ICD-10-CM | POA: Diagnosis present

## 2018-04-16 LAB — CBC WITH DIFFERENTIAL/PLATELET
BASOS ABS: 0 10*3/uL (ref 0.0–0.1)
BASOS PCT: 0 %
EOS ABS: 0.1 10*3/uL (ref 0.0–0.7)
Eosinophils Relative: 2 %
HCT: 34.1 % — ABNORMAL LOW (ref 39.0–52.0)
HEMOGLOBIN: 10 g/dL — AB (ref 13.0–17.0)
Lymphocytes Relative: 13 %
Lymphs Abs: 0.7 10*3/uL (ref 0.7–4.0)
MCH: 28.2 pg (ref 26.0–34.0)
MCHC: 29.3 g/dL — AB (ref 30.0–36.0)
MCV: 96.1 fL (ref 78.0–100.0)
MONO ABS: 0.3 10*3/uL (ref 0.1–1.0)
MONOS PCT: 6 %
NEUTROS PCT: 79 %
Neutro Abs: 4.6 10*3/uL (ref 1.7–7.7)
Platelets: 215 10*3/uL (ref 150–400)
RBC: 3.55 MIL/uL — ABNORMAL LOW (ref 4.22–5.81)
RDW: 15.7 % — ABNORMAL HIGH (ref 11.5–15.5)
WBC: 5.8 10*3/uL (ref 4.0–10.5)

## 2018-04-16 LAB — COMPREHENSIVE METABOLIC PANEL
ALBUMIN: 2.7 g/dL — AB (ref 3.5–5.0)
ALT: 15 U/L — ABNORMAL LOW (ref 17–63)
ANION GAP: 9 (ref 5–15)
AST: 19 U/L (ref 15–41)
Alkaline Phosphatase: 66 U/L (ref 38–126)
BUN: 43 mg/dL — ABNORMAL HIGH (ref 6–20)
CO2: 34 mmol/L — ABNORMAL HIGH (ref 22–32)
Calcium: 8.7 mg/dL — ABNORMAL LOW (ref 8.9–10.3)
Chloride: 100 mmol/L — ABNORMAL LOW (ref 101–111)
Creatinine, Ser: 1.39 mg/dL — ABNORMAL HIGH (ref 0.61–1.24)
GFR calc Af Amer: 56 mL/min — ABNORMAL LOW (ref >60)
GFR calc non Af Amer: 49 mL/min — ABNORMAL LOW (ref >60)
GLUCOSE: 214 mg/dL — AB (ref 65–99)
POTASSIUM: 4.5 mmol/L (ref 3.5–5.1)
Sodium: 143 mmol/L (ref 135–145)
Total Bilirubin: 0.5 mg/dL (ref 0.3–1.2)
Total Protein: 6.6 g/dL (ref 6.5–8.1)

## 2018-04-16 LAB — CULTURE, BLOOD (ROUTINE X 2)
Culture: NO GROWTH
Culture: NO GROWTH
Special Requests: ADEQUATE
Special Requests: ADEQUATE

## 2018-04-16 LAB — BASIC METABOLIC PANEL
Anion gap: 8 (ref 5–15)
BUN: 44 mg/dL — AB (ref 6–20)
CALCIUM: 8.3 mg/dL — AB (ref 8.9–10.3)
CO2: 33 mmol/L — AB (ref 22–32)
CREATININE: 1.38 mg/dL — AB (ref 0.61–1.24)
Chloride: 102 mmol/L (ref 101–111)
GFR calc Af Amer: 57 mL/min — ABNORMAL LOW (ref >60)
GFR calc non Af Amer: 49 mL/min — ABNORMAL LOW (ref >60)
GLUCOSE: 145 mg/dL — AB (ref 65–99)
Potassium: 4.4 mmol/L (ref 3.5–5.1)
Sodium: 143 mmol/L (ref 135–145)

## 2018-04-16 LAB — CBC
HEMATOCRIT: 30.6 % — AB (ref 39.0–52.0)
Hemoglobin: 9.1 g/dL — ABNORMAL LOW (ref 13.0–17.0)
MCH: 28.7 pg (ref 26.0–34.0)
MCHC: 29.7 g/dL — AB (ref 30.0–36.0)
MCV: 96.5 fL (ref 78.0–100.0)
Platelets: 165 10*3/uL (ref 150–400)
RBC: 3.17 MIL/uL — ABNORMAL LOW (ref 4.22–5.81)
RDW: 16 % — AB (ref 11.5–15.5)
WBC: 5 10*3/uL (ref 4.0–10.5)

## 2018-04-16 LAB — GLUCOSE, CAPILLARY
GLUCOSE-CAPILLARY: 136 mg/dL — AB (ref 65–99)
Glucose-Capillary: 214 mg/dL — ABNORMAL HIGH (ref 65–99)
Glucose-Capillary: 210 mg/dL — ABNORMAL HIGH (ref 65–99)
Glucose-Capillary: 173 mg/dL — ABNORMAL HIGH (ref 65–99)

## 2018-04-16 LAB — PHOSPHORUS: Phosphorus: 3.1 mg/dL (ref 2.5–4.6)

## 2018-04-16 LAB — MAGNESIUM: Magnesium: 2.4 mg/dL (ref 1.7–2.4)

## 2018-04-16 MED ORDER — HYDRALAZINE HCL 50 MG PO TABS
50.0000 mg | ORAL_TABLET | Freq: Three times a day (TID) | ORAL | Status: DC
  Administered 2018-04-16 – 2018-05-12 (×78): 50 mg via ORAL
  Filled 2018-04-16 (×78): qty 1

## 2018-04-16 MED ORDER — POLYETHYLENE GLYCOL 3350 17 G PO PACK
17.0000 g | PACK | Freq: Every day | ORAL | Status: DC
  Administered 2018-04-17 – 2018-05-11 (×16): 17 g via ORAL
  Filled 2018-04-16 (×25): qty 1

## 2018-04-16 MED ORDER — LEVOTHYROXINE SODIUM 75 MCG PO TABS
175.0000 ug | ORAL_TABLET | Freq: Every day | ORAL | Status: DC
  Administered 2018-04-17 – 2018-05-12 (×25): 175 ug via ORAL
  Filled 2018-04-16 (×26): qty 1

## 2018-04-16 MED ORDER — ONDANSETRON HCL 4 MG PO TABS: 4.0000 mg | ORAL_TABLET | Freq: Four times a day (QID) | ORAL | Status: DC | PRN

## 2018-04-16 MED ORDER — TORSEMIDE 20 MG PO TABS
80.0000 mg | ORAL_TABLET | Freq: Two times a day (BID) | ORAL | Status: DC
  Filled 2018-04-16 (×2): qty 4

## 2018-04-16 MED ORDER — PHENOL 1.4 % MT LIQD: 1.0000 | OROMUCOSAL | Status: DC | PRN

## 2018-04-16 MED ORDER — ACETAMINOPHEN 650 MG RE SUPP: 650.0000 mg | RECTAL | Status: DC | PRN

## 2018-04-16 MED ORDER — GABAPENTIN 400 MG PO CAPS
400.0000 mg | ORAL_CAPSULE | Freq: Three times a day (TID) | ORAL | Status: DC
  Administered 2018-04-16 – 2018-05-12 (×78): 400 mg via ORAL
  Filled 2018-04-16 (×78): qty 1

## 2018-04-16 MED ORDER — MENTHOL 3 MG MT LOZG
1.0000 | LOZENGE | OROMUCOSAL | Status: DC | PRN
  Filled 2018-04-16: qty 9

## 2018-04-16 MED ORDER — METOPROLOL TARTRATE 25 MG PO TABS
25.0000 mg | ORAL_TABLET | Freq: Two times a day (BID) | ORAL | Status: DC
  Administered 2018-04-16 – 2018-04-29 (×21): 25 mg via ORAL
  Filled 2018-04-16 (×28): qty 1

## 2018-04-16 MED ORDER — TORSEMIDE 20 MG PO TABS
80.0000 mg | ORAL_TABLET | Freq: Two times a day (BID) | ORAL | Status: DC
  Administered 2018-04-16 – 2018-04-24 (×16): 80 mg via ORAL
  Filled 2018-04-16 (×17): qty 4

## 2018-04-16 MED ORDER — ACETAMINOPHEN 325 MG PO TABS
650.0000 mg | ORAL_TABLET | ORAL | Status: DC | PRN
  Administered 2018-04-24 – 2018-05-09 (×5): 650 mg via ORAL
  Filled 2018-04-16 (×6): qty 2

## 2018-04-16 MED ORDER — ESCITALOPRAM OXALATE 10 MG PO TABS
10.0000 mg | ORAL_TABLET | Freq: Every day | ORAL | Status: DC
  Administered 2018-04-17 – 2018-05-12 (×26): 10 mg via ORAL
  Filled 2018-04-16 (×26): qty 1

## 2018-04-16 MED ORDER — SENNA 8.6 MG PO TABS
1.0000 | ORAL_TABLET | Freq: Two times a day (BID) | ORAL | Status: DC
  Administered 2018-04-16 – 2018-04-18 (×4): 8.6 mg via ORAL
  Filled 2018-04-16 (×4): qty 1

## 2018-04-16 MED ORDER — ONDANSETRON HCL 4 MG/2ML IJ SOLN: 4.0000 mg | Freq: Four times a day (QID) | INTRAMUSCULAR | Status: DC | PRN

## 2018-04-16 MED ORDER — INSULIN ASPART 100 UNIT/ML ~~LOC~~ SOLN
0.0000 [IU] | Freq: Three times a day (TID) | SUBCUTANEOUS | Status: DC
  Administered 2018-04-16: 5 [IU] via SUBCUTANEOUS
  Administered 2018-04-17 (×3): 3 [IU] via SUBCUTANEOUS
  Administered 2018-04-18: 5 [IU] via SUBCUTANEOUS
  Administered 2018-04-18 – 2018-04-19 (×3): 3 [IU] via SUBCUTANEOUS
  Administered 2018-04-19 (×2): 5 [IU] via SUBCUTANEOUS
  Administered 2018-04-20 (×2): 3 [IU] via SUBCUTANEOUS
  Administered 2018-04-20 – 2018-04-21 (×2): 2 [IU] via SUBCUTANEOUS
  Administered 2018-04-21 – 2018-04-22 (×3): 3 [IU] via SUBCUTANEOUS
  Administered 2018-04-22 – 2018-04-23 (×3): 2 [IU] via SUBCUTANEOUS
  Administered 2018-04-23: 8 [IU] via SUBCUTANEOUS
  Administered 2018-04-23: 3 [IU] via SUBCUTANEOUS
  Administered 2018-04-24: 2 [IU] via SUBCUTANEOUS
  Administered 2018-04-24: 8 [IU] via SUBCUTANEOUS
  Administered 2018-04-25: 1 [IU] via SUBCUTANEOUS
  Administered 2018-04-25: 3 [IU] via SUBCUTANEOUS
  Administered 2018-04-25: 5 [IU] via SUBCUTANEOUS
  Administered 2018-04-26: 2 [IU] via SUBCUTANEOUS
  Administered 2018-04-26: 3 [IU] via SUBCUTANEOUS
  Administered 2018-04-27 (×3): 2 [IU] via SUBCUTANEOUS
  Administered 2018-04-28: 3 [IU] via SUBCUTANEOUS
  Administered 2018-04-28: 2 [IU] via SUBCUTANEOUS
  Administered 2018-04-29 (×2): 3 [IU] via SUBCUTANEOUS
  Administered 2018-04-29: 2 [IU] via SUBCUTANEOUS
  Administered 2018-04-30: 3 [IU] via SUBCUTANEOUS
  Administered 2018-04-30 (×2): 2 [IU] via SUBCUTANEOUS
  Administered 2018-05-01: 3 [IU] via SUBCUTANEOUS
  Administered 2018-05-01: 2 [IU] via SUBCUTANEOUS
  Administered 2018-05-02: 5 [IU] via SUBCUTANEOUS
  Administered 2018-05-02 – 2018-05-03 (×2): 3 [IU] via SUBCUTANEOUS
  Administered 2018-05-03: 2 [IU] via SUBCUTANEOUS
  Administered 2018-05-04: 3 [IU] via SUBCUTANEOUS
  Administered 2018-05-04: 2 [IU] via SUBCUTANEOUS
  Administered 2018-05-04 – 2018-05-05 (×3): 3 [IU] via SUBCUTANEOUS
  Administered 2018-05-06: 5 [IU] via SUBCUTANEOUS
  Administered 2018-05-06: 2 [IU] via SUBCUTANEOUS
  Administered 2018-05-07: 3 [IU] via SUBCUTANEOUS
  Administered 2018-05-07: 5 [IU] via SUBCUTANEOUS
  Administered 2018-05-08 (×2): 2 [IU] via SUBCUTANEOUS
  Administered 2018-05-08 – 2018-05-09 (×2): 3 [IU] via SUBCUTANEOUS
  Administered 2018-05-09: 2 [IU] via SUBCUTANEOUS
  Administered 2018-05-09 – 2018-05-10 (×2): 3 [IU] via SUBCUTANEOUS
  Administered 2018-05-10 (×2): 2 [IU] via SUBCUTANEOUS
  Administered 2018-05-11 – 2018-05-12 (×3): 3 [IU] via SUBCUTANEOUS

## 2018-04-16 MED ORDER — TORSEMIDE 20 MG PO TABS: 80.0000 mg | ORAL_TABLET | Freq: Two times a day (BID) | ORAL | Status: DC

## 2018-04-16 MED ORDER — PRAMIPEXOLE DIHYDROCHLORIDE 0.25 MG PO TABS
0.5000 mg | ORAL_TABLET | Freq: Two times a day (BID) | ORAL | Status: DC
  Administered 2018-04-16 – 2018-05-12 (×52): 0.5 mg via ORAL
  Filled 2018-04-16 (×54): qty 2

## 2018-04-16 MED ORDER — SORBITOL 70 % SOLN
30.0000 mL | Freq: Every day | Status: DC | PRN
  Administered 2018-05-02: 30 mL via ORAL
  Filled 2018-04-16 (×2): qty 30

## 2018-04-16 MED ORDER — TAMSULOSIN HCL 0.4 MG PO CAPS
0.4000 mg | ORAL_CAPSULE | Freq: Every day | ORAL | Status: DC
  Administered 2018-04-17 – 2018-05-12 (×26): 0.4 mg via ORAL
  Filled 2018-04-16 (×25): qty 1

## 2018-04-16 MED ORDER — FESOTERODINE FUMARATE ER 8 MG PO TB24
8.0000 mg | ORAL_TABLET | Freq: Every day | ORAL | Status: DC
  Administered 2018-04-17 – 2018-05-12 (×26): 8 mg via ORAL
  Filled 2018-04-16 (×27): qty 1

## 2018-04-16 NOTE — Progress Notes (Addendum)
Patient arrived on unit with spouse. Oriented to unit procedures and resting comfortably.   Called in NT because was feeling either anxious or "short of breath." RN responded and reported O2 sat in 70s and 80s. O2 increased to 4 Litres. Patient felt better, O2 sats reached low 90s, ate dinner and remains in low 90s.  Contacted MD at shift change. Awaiting call back from MD regarding oxygen use. Contacted respiratory. RT will place on BiPap and continue to monitor.

## 2018-04-16 NOTE — Discharge Instructions (Signed)
Acute Respiratory Failure, Adult °Acute respiratory failure occurs when there is not enough oxygen passing from your lungs to your body. When this happens, your lungs have trouble removing carbon dioxide from the blood. This causes your blood oxygen level to drop too low as carbon dioxide builds up. °Acute respiratory failure is a medical emergency. It can develop quickly, but it is temporary if treated promptly. Your lung capacity, or how much air your lungs can hold, may improve with time, exercise, and treatment. °What are the causes? °There are many possible causes of acute respiratory failure, including: °· Lung injury. °· Chest injury or damage to the ribs or tissues near the lungs. °· Lung conditions that affect the flow of air and blood into and out of the lungs, such as pneumonia, acute respiratory distress syndrome, and cystic fibrosis. °· Medical conditions, such as strokes or spinal cord injuries, that affect the muscles and nerves that control breathing. °· Blood infection (sepsis). °· Inflammation of the pancreas (pancreatitis). °· A blood clot in the lungs (pulmonary embolism). °· A large-volume blood transfusion. °· Burns. °· Near-drowning. °· Seizure. °· Smoke inhalation. °· Reaction to medicines. °· Alcohol or drug overdose. ° °What increases the risk? °This condition is more likely to develop in people who have: °· A blocked airway. °· Asthma. °· A condition or disease that damages or weakens the muscles, nerves, bones, or tissues that are involved in breathing. °· A serious infection. °· A health problem that blocks the unconscious reflex that is involved in breathing, such as hypothyroidism or sleep apnea. °· A lung injury or trauma. ° °What are the signs or symptoms? °Trouble breathing is the main symptom of acute respiratory failure. Symptoms may also include: °· Rapid breathing. °· Restlessness or anxiety. °· Skin, lips, or fingernails that appear blue (cyanosis). °· Rapid heart  rate. °· Abnormal heart rhythms (arrhythmias). °· Confusion or changes in behavior. °· Tiredness or loss of energy. °· Feeling sleepy or having a loss of consciousness. ° °How is this diagnosed? °Your health care provider can diagnose acute respiratory failure with a medical history and physical exam. During the exam, your health care provider will listen to your heart and check for crackling or wheezing sounds in your lungs. Your may also have tests to confirm the diagnosis and determine what is causing respiratory failure. These tests may include: °· Measuring the amount of oxygen in your blood (pulse oximetry). The measurement comes from a small device that is placed on your finger, earlobe, or toe. °· Other blood tests to measure blood gases and to look for signs of infection. °· Sampling your cerebral spinal fluid or tracheal fluid to check for infections. °· Chest X-ray to look for fluid in spaces that should be filled with air. °· Electrocardiogram (ECG) to look at the heart's electrical activity. ° °How is this treated? °Treatment for this condition usually takes places in a hospital intensive care unit (ICU). Treatment depends on what is causing the condition. It may include one or more treatments until your symptoms improve. Treatment may include: °· Supplemental oxygen. Extra oxygen is given through a tube in the nose, a face mask, or a hood. °· A device such as a continuous positive airway pressure (CPAP) or bi-level positive airway pressure (BiPAP or BPAP) machine. This treatment uses mild air pressure to keep the airways open. A mask or other device will be placed over your nose or mouth. A tube that is connected to a motor will deliver   oxygen through the mask.  Ventilator. This treatment helps move air into and out of the lungs. This may be done with a bag and mask or a machine. For this treatment, a tube is placed in your windpipe (trachea) so air and oxygen can flow to the lungs.  Extracorporeal  membrane oxygenation (ECMO). This treatment temporarily takes over the function of the heart and lungs, supplying oxygen and removing carbon dioxide. ECMO gives the lungs a chance to recover. It may be used if a ventilator is not effective.  Tracheostomy. This is a procedure that creates a hole in the neck to insert a breathing tube.  Receiving fluids and medicines.  Rocking the bed to help breathing.  Follow these instructions at home:  Take over-the-counter and prescription medicines only as told by your health care provider.  Return to normal activities as told by your health care provider. Ask your health care provider what activities are safe for you.  Keep all follow-up visits as told by your health care provider. This is important. How is this prevented? Treating infections and medical conditions that may lead to acute respiratory failure can help prevent the condition from developing. Contact a health care provider if:  You have a fever.  Your symptoms do not improve or they get worse. Get help right away if:  You are having trouble breathing.  You lose consciousness.  Your have cyanosis or turn blue.  You develop a rapid heart rate.  You are confused. These symptoms may represent a serious problem that is an emergency. Do not wait to see if the symptoms will go away. Get medical help right away. Call your local emergency services (911 in the [place] Do not drive yourself to the hospital. This information is not intended to replace advice given to you by your health care provider. Make sure you discuss any questions you have with your health care provider. Document Released: 12/20/2013 Document Revised: 07/12/2016 Document Reviewed: 07/02/2016 Elsevier Interactive Patient Education  2018 [place] of Breath, Adult Shortness of breath means you have trouble breathing. Your lungs are organs for breathing. Follow these instructions at home: Pay attention  to any changes in your symptoms. Take these actions to help with your condition:  Do not smoke. Smoking can cause shortness of breath. If you need help to quit smoking, ask your doctor.  Avoid things that can make it harder to breathe, such as: ? Mold. ? Dust. ? Air pollution. ? Chemical smells. ? Things that can cause allergy symptoms (allergens), if you have allergies.  Keep your living space clean and free of mold and dust.  Rest as needed. Slowly return to your usual activities.  Take over-the-counter and prescription medicines, including oxygen and inhaled medicines, only as told by your doctor.  Keep all follow-up visits as told by your doctor. This is important.  Contact a doctor if:  Your condition does not get better as soon as expected.  You have a hard time doing your normal activities, even after you rest.  You have new symptoms. Get help right away if:  You have trouble breathing when you are resting.  You feel light-headed or you faint.  You have a cough that is not helped by medicines.  You cough up blood.  You have pain with breathing.  You have pain in your chest, arms, shoulders, or belly (abdomen).  You have a fever.  You cannot walk up stairs.  You cannot exercise the way  you normally do. This information is not intended to replace advice given to you by your health care provider. Make sure you discuss any questions you have with your health care provider. Document Released: 06/02/2008 Document Revised: 01/01/2017 Document Reviewed: 01/01/2017 Elsevier Interactive Patient Education  2017 [employer]   Atelectasis, Adult Atelectasis is a collapse of air sacs in the lungs (alveoli). The condition causes all or part of a lung to collapse. Atelectasis is a common problem after surgery. Its severity depends on the size of lung tissue area involved and the underlying cause. When severe, it can lead to shortness of breath and heart  problems. Atelectasis can develop suddenly or over a long period of time. Atelectasis that develops over a long period of time (chronic atelectasis) often leads to infection, scarring, and other problems. What are the causes? This condition may be caused by:  Shallow breathing.  Medicines that make breathing more shallow.  A blockage in an airway. Blockages can result from: ? A buildup of mucus. ? A tumor. ? An inhaled object (foreign body). ? Enlarged lymph nodes. ? Fluid in the lungs (pleural effusion). ? A blood clot in the lungs.  Outside pressure on the lung. Pressure can be due to: ? A tumor. ? Fluid in the lungs (pleural effusion). ? Air leaking between the lung and rib cage (pneumothorax). ? Enlarged lymph nodes.  Improper expansion of the lungs. This may occur in newborns because of: ? Prematurity. ? Low oxygen levels. ? Secretions at birth that block the airway. ? Amniotic fluid that goes into the lungs (aspiration).  What increases the risk? This condition is more likely to develop in people who:  Have an injury or health problem that makes taking deep breaths difficult or painful.  Have certain infections or diseases, such as pneumonia or cystic fibrosis.  Have had surgery on the chest or abdomen.  Have broken ribs.  Have a tight bandage around their chest.  Have a collapsed lung due to pneumothorax.  Take medicines that decrease the rate of their breathing or how deeply they breathe, like sedatives.  Lie flat for long periods of time.  What are the signs or symptoms? Often, there are no symptoms for this condition. When symptoms do appear, they may include:  Shortness of breath.  Bluish color to the nails, lips, or mouth (cyanosis).  A cough.  How is this diagnosed? This condition may be diagnosed based on:  Symptoms.  A physical exam.  A chest X-ray.  Sometimes specialized imaging tests are needed to diagnose the condition. How is this  treated? Treatment for this condition depends on what caused the condition. Treatment may involve:  Coughing. Coughing helps loosen mucus in the airway.  Chest physiotherapy. This is a treatment to help loosen and clear mucus from the airways. It is done by clapping the chest.  Postural drainage techniques. This treatment involves positioning your body so your head is lower than your chest. It helps mucus drain from your airways.  An incentive spirometer. This is a device that is used to help with taking deeper breaths.  Positive pressure breathing. This is a form of breathing assistance in which air is forced into the lungs when you breathe in (inhale). You may have this treatment if your condition is severe.  Treatment of the underlying condition.  Follow these instructions at home:  Take over-the-counter and prescription medicines only as told by your health care provider.  Practice taking relaxed and deep breaths  when you are sitting. A good time to practice is when you are watching TV. Take a few deep breaths during each commercial break.  Make sure to lie on your unaffected side when you are lying down. For example, if you have atelectasis in your left lung, lie on your right side. This will help mucus drain from your airway.  Cough several times a day as told by your health care provider.  Perform chest physiotherapy or postural drainage techniques as told by your health care provider. If necessary, have someone help you.  If you were given a device to help with breathing, use it as told by your health care provider.  Stay as active as possible. Get help right away if:  Your breathing problems get worse.  You have severe chest pain.  You develop severe coughing.  You cough up blood.  You have a fever.  You have persistent symptoms for more than 2-3 days.  Your symptoms suddenly get worse. This information is not intended to replace advice given to you by your  health care provider. Make sure you discuss any questions you have with your health care provider. Document Released: 12/15/2005 Document Revised: 07/04/2016 Document Reviewed: 05/19/2016 Elsevier Interactive Patient Education  [employer].

## 2018-04-16 NOTE — Progress Notes (Signed)
Progress Note  Patient Name: Travis [name] Date of Encounter: 04/16/2018  Primary Cardiologist:   Mertie Moores, MD   Subjective   Breathing OK. Mild back pain.    Inpatient Medications    Scheduled Meds: . escitalopram  10 mg Oral Daily  . fesoterodine  8 mg Oral Daily  . furosemide  80 mg Intravenous BID  . gabapentin  400 mg Oral TID  . hydrALAZINE  25 mg Oral Q8H  . insulin aspart  0-15 Units Subcutaneous TID WC  . levothyroxine  175 mcg Oral QAC breakfast  . mouth rinse  15 mL Mouth Rinse BID  . metoprolol tartrate  25 mg Oral BID  . polyethylene glycol  17 g Oral Daily  . pramipexole  0.5 mg Oral BID  . senna  1 tablet Oral BID  . tamsulosin  0.4 mg Oral Daily   Continuous Infusions:  PRN Meds: acetaminophen **OR** acetaminophen, fentaNYL (SUBLIMAZE) injection, menthol-cetylpyridinium **OR** phenol, ondansetron **OR** ondansetron (ZOFRAN) IV   Vital Signs    Vitals:   04/16/18 0600 04/16/18 0700 04/16/18 0800 04/16/18 0801  BP: (!) 143/64 (!) 155/68 (!) 148/74   Pulse: 65 77 76   Resp: (!) 24 (!) 36 (!) 32   Temp:    99 F (37.2 C)  TempSrc:    Oral  SpO2: 91% (!) 84% 90%   Weight:      Height:        Intake/Output Summary (Last 24 hours) at 04/16/2018 0915 Last data filed at 04/16/2018 0639 Gross per 24 hour  Intake 1080 ml  Output 2026 ml  Net -946 ml   Filed Weights   04/14/18 0500 04/15/18 0500 04/16/18 0353  Weight: (!) 305 lb 12.5 oz (138.7 kg) 298 lb 4.5 oz (135.3 kg) 297 lb 13.5 oz (135.1 kg)    Telemetry    Sinus and sinus tachycardia - Personally Reviewed  ECG    NA - Personally Reviewed  Physical Exam   GEN: No acute distress.   Neck: No  JVD Cardiac: RRR, no murmurs, rubs, or gallops.  Respiratory:   Decreased breath sounds bilaterally. GI: Soft, nontender, non-distended  MS: Moderate to severe diffuse edema; No deformity. Neuro:  Nonfocal  Psych: Normal affect   Labs    Chemistry Recent Labs  Lab 04/14/18 1239  04/15/18 1215 04/16/18 0301  NA 142 144 143  K 4.3 4.5 4.4  CL 104 103 102  CO2 29 33* 33*  GLUCOSE 188* 205* 145*  BUN 50* 43* 44*  CREATININE 1.39* 1.34* 1.38*  CALCIUM 8.4* 8.5* 8.3*  GFRNONAA 49* 51* 49*  GFRAA 56* 59* 57*  ANIONGAP 9 8 8      Hematology Recent Labs  Lab 04/14/18 1239 04/15/18 0338 04/16/18 0301  WBC 5.2 5.3 5.0  RBC 3.32* 3.28* 3.17*  HGB 9.5* 9.4* 9.1*  HCT 31.4* 31.4* 30.6*  MCV 94.6 95.7 96.5  MCH 28.6 28.7 28.7  MCHC 30.3 29.9* 29.7*  RDW 16.1* 16.0* 16.0*  PLT 150 153 165    Cardiac EnzymesNo results for input(s): TROPONINI in the last 168 hours. No results for input(s): TROPIPOC in the last 168 hours.   BNP Recent Labs  Lab 04/10/18 0405 04/11/18 0407  BNP 238.1* 120.9*     DDimer No results for input(s): DDIMER in the last 168 hours.   Radiology    Dg Chest Port 1 View  Result Date: 04/15/2018 CLINICAL DATA:  Respiratory failure. EXAM: PORTABLE CHEST 1 VIEW COMPARISON:  04/14/2018 FINDINGS: Redemonstration of cardiomegaly and aortic atherosclerosis with atelectasis in both lower lungs right worse than left. There may be slightly improved aeration at the left lung base. No worsening or new finding. IMPRESSION: Persistent lower lung atelectasis right more than left. Possible slight improved aeration at the left base. Electronically Signed   By: Nelson Chimes M.D.   On: 04/15/2018 09:24   Dg Chest Port 1 View  Result Date: 04/14/2018 CLINICAL DATA:  Respiratory failure.  Recent spine surgery. EXAM: PORTABLE CHEST 1 VIEW COMPARISON:  04/12/2018 FINDINGS: Persistent and slightly worsened atelectasis in both lung bases. Upper lungs remain largely clear. This could be simple atelectasis or atelectatic pneumonia. No visible effusion. Healing rib fracture on the left of the fifth rib. IMPRESSION: Persistent and slightly worsened atelectasis at both lung bases. Electronically Signed   By: Nelson Chimes M.D.   On: 04/14/2018 09:42    Cardiac  Studies   Echocardiogram 03/06/18: Study Conclusions  - Left ventricle: The cavity size was normal. Wall thickness was increased in a pattern of mild LVH. Systolic function was vigorous. The estimated ejection fraction was in the range of 65% to 70%. Wall motion was normal; there were no regional wall motion abnormalities. Doppler parameters are consistent with pseudonormal left ventricular relaxation (grade 2 diastolic dysfunction). The E/A ratio is >1.5. The E/e&' ratio is >15, suggesting elevated LV filling pressure. - Left atrium: The atrium was mildly dilated. - Inferior vena cava: The vessel was normal in size. The respirophasic diameter changes were in the normal range (>= 50%), consistent with normal central venous pressure.   Patient Profile     72 y.o. male with chronic diastolic heart failure, OSA, hypertension, diabetes and mild pulmonary hypertension admitted 04/08/18 for back surgery and developed increased shortness of breath post operatively.   Assessment & Plan    ACUTE ON CHRONIC DIASTOLIC HF:    I do not want his need for diuresis to limit his discharge to rehab.  I will change to PO diuretic.    HTN:  BP is elevated.  Increase hydralazine to 50 mg tid.  Still holding ARB.    AKI ON CKD III:   Creat is stable.   Holding ARB.    For questions or updates, please contact [place] Please consult [email or web address] for contact info under Cardiology/STEMI.   Signed, Minus Breeding, MD  04/16/2018, 9:15 AM

## 2018-04-16 NOTE — H&P (Signed)
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  He started declining early January with paraparesis and progressing to paraparesis and inability to walk or perform ADLs due to severe recurrent recurrent lumbar stenosis L4-5 and thoracic spinal stenosis T1-T2.  He was admitted 04/08/2018 for decompressive lumbar laminectomy with arthrodesis L4-L5 and partial C7" T1 laminectomy with medial facetectomy and for venotomy by Dr. Ronnald Ramp.  Postop was showing some improvement in bilateral lower extremity strength as well as ability to activate quads and and plantar dorsiflex.    He became obtunded 4/12 pm  due to acute on chronic hypercarbic respiratory failure with encephalopathy requiring BIPAP and transfer to step down with follow-up per critical care pulmonary services as well as cardiology. . CXR showed atelectasis with possible consolidation and he was treated with diuresis.  Patient currently on IV Lasix with plans to slowly transition to p.o.  A recent echocardiogram reviewed from 03/06/2018 showed ejection fraction of 05% grade 2 diastolic dysfunction.  He developed fevers with rigors  and was started on IV Vanc/Zosyn and later transition to [facility] with course completed.  Vancomycin d/c due to worsening of renal function.  Follow-up wound care nurse for bilateral lower extremity ischemic changes with Unna boots and dressing care as directed.  Acute on chronic anemia hemoglobin 9.4.  Physical occupational therapy evaluations completed.  Patient was admitted for a comprehensive rehab program. Discussed with Cardiology, who stated patient stable and ready for transfer to CIR.  Review of Systems  Constitutional: Negative for chills  and fever.  HENT: Negative for hearing loss.   Eyes: Negative for blurred vision and double vision.  Respiratory: Positive for shortness of breath.   Cardiovascular: Positive for leg swelling. Negative for palpitations.  Gastrointestinal: Positive for constipation. Negative for nausea and vomiting.  Genitourinary: Negative for dysuria, flank pain and hematuria.  Musculoskeletal: Positive for back pain.  Skin: Negative for rash.  Neurological: Negative for seizures.  All other systems reviewed and are negative.   Past Medical History:  Diagnosis Date  . Cervical myelopathy (HCC)    with myelomalacia at C5-6 and C3-4  . CHF (congestive heart failure) ([place])   . Constipation - functional    controlled with miralax  . COPD (chronic obstructive pulmonary disease) (HCC)    mild  . DJD (degenerative joint disease)    WHOLE SPINE  . Dyspnea   . Gait disorder   . Heart murmur    years ago   . Hypertension   . Hypothyroidism    Low d/t treatment of hyperthyroidism  . 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 Peripheral neuropathy   . Pneumonia   . PONV (postoperative nausea and vomiting)   . RBBB   . Renal disorder    acute kidney failure  . Restless legs syndrome 03/14/2015  . RLS (restless legs syndrome)   . Sleep apnea    DX  2009/2010 study done at [facility]  . 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 Type II diabetes mellitus ([place])    diet controlled   Past Surgical History:  Procedure Laterality Date  . BACK SURGERY     6 surgeries  . CARDIAC CATHETERIZATION [name] 09/07/2015   Procedure: Right/Left Heart Cath and Coronary  Angiography;  Surgeon: Peter M Martinique, MD;  Location: [place] [facility];  Service: Cardiovascular;  Laterality: [name];  . CHOLECYSTECTOMY    . EYE SURGERY     BIL CATARACTS  . LUMBAR LAMINECTOMY/DECOMPRESSION MICRODISCECTOMY  07/14/2012   Procedure: LUMBAR LAMINECTOMY/DECOMPRESSION MICRODISCECTOMY 2 LEVELS;  Surgeon: Eustace Moore, MD;  Location: [place] [facility];   Service: Neurosurgery;  Laterality: Left;  Lumbar two three laminectomy, left thoracic four five transpedicular diskectomy  . POSTERIOR CERVICAL FUSION/FORAMINOTOMY  04/08/2018   Procedure: Laminectomy and Foraminotomy - Lumbar Four-Lumbar Five, instrumented posterolateral fusion Lumbar Four- Five, Laminectomy and Foraminotomy - Thoracic One-Thoracic Two;  Surgeon: Eustace Moore, MD;  Location: [facility] OR;  Service: Neurosurgery;;  posterior  . POSTERIOR LUMBAR FUSION  04/08/2018   Laminectomy and Foraminotomy - Lumbar Four-Lumbar Five, instrumented posterolateral fusion Lumbar Four- Five, Laminectomy and Foraminotomy - Thoracic One-Thoracic Two  . POSTERIOR LUMBAR FUSION  04/08/2018   Laminectomy and Foraminotomy - Lumbar Four-Lumbar Five, instrumented posterolateral fusion Lumbar Four- Five, Laminectomy and Foraminotomy - Thoracic One-Thoracic Two   Family History  Problem Relation Age of Onset  . Polycythemia Mother   . Diabetes Father   . Hypertension Father   . Heart disease Father   . CAD Unknown        1 of his 4 brothers and father has CAD   Social History:  reports that he quit smoking about 32 years ago. His smoking use included cigarettes. He has a 40.00 pack-year smoking history. He quit smokeless tobacco use about 32 years ago. He reports that he does not drink alcohol or use drugs. Allergies:  Allergies  Allergen Reactions  . Other Other (See Comments)    ANESTHESIA UNSPECIFIED  -- makes sick, must have antinausea medication in advance.  . Levofloxacin In D5w Itching    Possible infusion reaction 03/29/17. Tolerating oral Levaquin 03/30/17   Medications Prior to Admission  Medication Sig Dispense Refill  . aspirin EC 81 MG EC tablet Take 1 tablet (81 mg total) by mouth daily. 30 tablet 0  . baclofen (LIORESAL) 10 MG tablet Take 1 tablet (10 mg total) by mouth 3 (three) times daily. For muscle spasms 90 each 1  . escitalopram (LEXAPRO) 10 MG tablet Take 1 tablet (10 mg total) by  mouth daily. 30 tablet 2  . gabapentin (NEURONTIN) 400 MG capsule TAKE 1 CAPSULE (400 MG TOTAL) BY MOUTH EVERY 8 (EIGHT) HOURS. 90 capsule 0  . glimepiride (AMARYL) 1 MG tablet Take 1 tablet (1 mg total) by mouth daily with breakfast. 30 tablet 0  . hydrALAZINE (APRESOLINE) 50 MG tablet Take 1 tablet (50 mg total) by mouth every 8 (eight) hours. 90 tablet 0  . levothyroxine (SYNTHROID, LEVOTHROID) 175 MCG tablet Take 1 tablet (175 mcg total) by mouth daily. 30 tablet 2  . losartan (COZAAR) 50 MG tablet Take 50 mg by mouth daily.    . polyethylene glycol (MIRALAX / GLYCOLAX) packet Take 17 g by mouth daily as needed. For constipation 14 each 0  . potassium chloride (KLOR-CON 10) 10 MEQ tablet Take 1 tablet (10 mEq total) by mouth 2 (two) times daily. 180 tablet 3  . pramipexole (MIRAPEX) 0.5 MG tablet Take 1 tablet (0.5 mg total) by mouth 2 (two) times daily. 60 tablet 0  . tamsulosin (FLOMAX) 0.4 MG CAPS capsule Take 1 capsule (0.4 mg total) by mouth daily. 30 capsule 0  . torsemide (DEMADEX) 20 MG tablet 1 tablet twice daily and 1 extra tablet  daily as needed for swelling 270 tablet 3  . TOVIAZ 8 MG TB24 tablet Take 1 tablet (8 mg total) by mouth daily. 30 tablet 11  . ciprofloxacin (CIPRO) 500 MG tablet 500 mg 2 (two) times daily. X 10 days  0    Drug Regimen Review Drug regimen was reviewed and remains appropriate with no significant issues identified  Home: Home Living Family/patient expects to be discharged to:: Private residence Living Arrangements: Spouse/significant other Available Help at Discharge: Family, Available 24 hours/day, Personal care attendant Type of Home: House Home Access: Ramped entrance Home Layout: One level Bathroom Shower/Tub: Multimedia programmer: Handicapped height Bathroom Accessibility: Yes Home Equipment: Environmental consultant - 2 wheels, Environmental consultant - 4 wheels, Cane - single point, Bedside commode, Wheelchair - manual, Shower seat - built in, [employer] -  tub/shower Additional Comments: pt had caregivers 3x a week for bathing.  pt's wife was able to assist with transfers and toileting. pt mostly w/c level   Functional History: Prior Function Level of Independence: Needs assistance Gait / Transfers Assistance Needed: Pt could transfer with assist but has not walked functionally since december ADL's / Homemaking Assistance Needed: Pt can feed self and assist with some UE adls but has assist with all dressing and LE bathing, toileting.  Comments: uses WC and lift chair at home  Functional Status:  Mobility: Bed Mobility Overal bed mobility: Needs Assistance Bed Mobility: Supine to Sit Rolling: Mod assist, +2 for physical assistance Sidelying to sit: +2 for physical assistance, Max assist Supine to sit: +2 for physical assistance, Max assist, HOB elevated Sit to supine: (Modified long sitting to supine) General bed mobility comments: max assist for LEs to EOB, mod assist for trunk, max assist to position hips at EOB with bed pad Transfers Overall transfer level: Needs assistance Equipment used: (see below) Transfer via Lift Equipment: Marketing executive Transfers: Sit to/from Stand Sit to Stand: Total assist Stand pivot transfers: Max assist, +2 physical assistance General transfer comment: performed x 1 from elevated bed and elevated chair Ambulation/Gait Ambulation/Gait assistance: Max assist, +2 physical assistance Ambulation Distance (Feet): 1 Feet Assistive device: Rolling walker (2 wheeled) Gait Pattern/deviations: Step-to pattern, Decreased step length - right, Decreased step length - left, Decreased dorsiflexion - right, Decreased dorsiflexion - left, Decreased weight shift to right, Decreased weight shift to left General Gait Details: Pt took two very slow steps forward and backward needing Max cues for sequencing and Max A +2 to stay upright and maintain balance.   Gait velocity: reduced    ADL: ADL Overall ADL's : Needs  assistance/impaired Eating/Feeding: Supervision/ safety, Sitting, Set up Eating/Feeding Details (indicate cue type and reason): pt fatigues quickly.  Needs lid on drink with straw Grooming: Brushing hair, Sitting, Set up Grooming Details (indicate cue type and reason): assist reaching overhead to comb hair Upper Body Bathing: Moderate assistance, Sitting Upper Body Bathing Details (indicate cue type and reason): pt requires supported sitting due to poor balance. Lower Body Bathing: Total assistance, +2 for physical assistance, Sit to/from stand, Cueing for safety, Cueing for compensatory techniques Lower Body Bathing Details (indicate cue type and reason): Pt unable to dress LE at this time due to fatigue, poor balance and inability to reach LEs. Pt may need adaptive equipment at some point. Upper Body Dressing : Minimal assistance, Sitting Lower Body Dressing: Total assistance, Bed level Toilet Transfer: +2 for physical assistance, Maximal assistance(with Stedy) Toilet Transfer Details (indicate cue type and reason): Using Stedy to simulate from bed  to chair.  Toileting- Clothing Manipulation and Hygiene: Total assistance Toileting - Clothing Manipulation Details (indicate cue type and reason): pt with loss of bowel in standing, cleaned Functional mobility during ADLs: Maximal assistance, +2 for physical assistance(Stedy) General ADL Comments: pt remains highly motivated  Cognition: Cognition Overall Cognitive Status: Impaired/Different from baseline Orientation Level: Oriented X4 Cognition Arousal/Alertness: Awake/alert Behavior During Therapy: WFL for tasks assessed/performed Overall Cognitive Status: Impaired/Different from baseline Area of Impairment: Memory Current Attention Level: Sustained Memory: Decreased recall of precautions Following Commands: Follows one step commands consistently Awareness: Emergent Problem Solving: Slow processing General Comments: Functional cognition  this session.   Physical Exam: Blood pressure (!) 148/74, pulse 72, temperature 99.2 F (37.3 C), temperature source Oral, resp. rate (!) 26, height 6' 1" (1.854 m), weight 135.1 kg (297 lb 13.5 oz), SpO2 93 %. Physical Exam  Vitals reviewed. Constitutional: He appears well-developed.  73 year old right-handed obese male  HENT:  Head: Normocephalic and atraumatic.  Eyes: EOM are normal. Right eye exhibits no discharge. Left eye exhibits no discharge.  Neck: Normal range of motion. Neck supple. No tracheal deviation present. No thyromegaly present.  Cardiovascular: Normal rate and regular rhythm.  Respiratory:  Fair inspiratory effort clear to auscultation Increased WOB +Ulm  GI: Soft. Bowel sounds are normal. He exhibits distension.  Musculoskeletal:  LE edema  Neurological: He is alert.  He was able to follow simple commands without difficulty. Motor: B/l UE 4/5 proximal to distal B/l LE: HF 2+/5, KE 3/5, adf 3+/5 Sensation diminished to light touch b/l LE  Skin:  Vasc changes with compression dressing b/l LE  Psychiatric: He has a normal mood and affect. His behavior is normal.   Results for orders placed or performed during the hospital encounter of 04/08/18 (from the past 48 hour(s))  Basic metabolic panel     Status: Abnormal   Collection Time: 04/14/18 12:39 PM  Result Value Ref Range   Sodium 142 135 - 145 mmol/L   Potassium 4.3 3.5 - 5.1 mmol/L   Chloride 104 101 - 111 mmol/L   CO2 29 22 - 32 mmol/L   Glucose, Bld 188 (H) 65 - 99 mg/dL   BUN 50 (H) 6 - 20 mg/dL   Creatinine, Ser 1.39 (H) 0.61 - 1.24 mg/dL   Calcium 8.4 (L) 8.9 - 10.3 mg/dL   GFR calc non Af Amer 49 (L) >60 mL/min   GFR calc Af Amer 56 (L) >60 mL/min    Comment: (NOTE) The eGFR has been calculated using the CKD EPI equation. This calculation has not been validated in all clinical situations. eGFR's persistently <60 mL/min signify possible Chronic Kidney Disease.    Anion gap 9 5 - 15     Comment: Performed at [facility] [address] [place], [place] [address]  CBC     Status: Abnormal   Collection Time: 04/14/18 12:39 PM  Result Value Ref Range   WBC 5.2 4.0 - 10.5 K/uL   RBC 3.32 (L) 4.22 - 5.81 MIL/uL   Hemoglobin 9.5 (L) 13.0 - 17.0 g/dL   HCT 31.4 (L) 39.0 - 52.0 %   MCV 94.6 78.0 - 100.0 fL   MCH 28.6 26.0 - 34.0 pg   MCHC 30.3 30.0 - 36.0 g/dL   RDW 16.1 (H) 11.5 - 15.5 %   Platelets 150 150 - 400 K/uL    Comment: Performed at [facility] [address], [place], [place] [address]  Glucose, capillary  Status: Abnormal   Collection Time: 04/14/18  3:00 PM  Result Value Ref Range   Glucose-Capillary 157 (H) 65 - 99 mg/dL   Comment 1 Notify RN   Glucose, capillary     Status: Abnormal   Collection Time: 04/14/18  9:54 PM  Result Value Ref Range   Glucose-Capillary 187 (H) 65 - 99 mg/dL  CBC     Status: Abnormal   Collection Time: 04/15/18  3:38 AM  Result Value Ref Range   WBC 5.3 4.0 - 10.5 K/uL   RBC 3.28 (L) 4.22 - 5.81 MIL/uL   Hemoglobin 9.4 (L) 13.0 - 17.0 g/dL   HCT 31.4 (L) 39.0 - 52.0 %   MCV 95.7 78.0 - 100.0 fL   MCH 28.7 26.0 - 34.0 pg   MCHC 29.9 (L) 30.0 - 36.0 g/dL   RDW 16.0 (H) 11.5 - 15.5 %   Platelets 153 150 - 400 K/uL    Comment: Performed at [facility], [place]. [address] [place], [place] [address]  Glucose, capillary     Status: Abnormal   Collection Time: 04/15/18  7:37 AM  Result Value Ref Range   Glucose-Capillary 173 (H) 65 - 99 mg/dL  Basic metabolic panel     Status: Abnormal   Collection Time: 04/15/18 12:15 PM  Result Value Ref Range   Sodium 144 135 - 145 mmol/L   Potassium 4.5 3.5 - 5.1 mmol/L   Chloride 103 101 - 111 mmol/L   CO2 33 (H) 22 - 32 mmol/L   Glucose, Bld 205 (H) 65 - 99 mg/dL   BUN 43 (H) 6 - 20 mg/dL   Creatinine, Ser 1.34 (H) 0.61 - 1.24 mg/dL   Calcium 8.5 (L) 8.9 - 10.3 mg/dL   GFR calc non Af Amer 51 (L) >60 mL/min   GFR calc Af Amer 59 (L) >60 mL/min    Comment:  (NOTE) The eGFR has been calculated using the CKD EPI equation. This calculation has not been validated in all clinical situations. eGFR's persistently <60 mL/min signify possible Chronic Kidney Disease.    Anion gap 8 5 - 15    Comment: Performed at [facility] [address] [place], [place] [address]  Glucose, capillary     Status: Abnormal   Collection Time: 04/15/18 12:22 PM  Result Value Ref Range   Glucose-Capillary 197 (H) 65 - 99 mg/dL  Glucose, capillary     Status: Abnormal   Collection Time: 04/15/18  3:03 PM  Result Value Ref Range   Glucose-Capillary 237 (H) 65 - 99 mg/dL  Glucose, capillary     Status: Abnormal   Collection Time: 04/15/18  9:36 PM  Result Value Ref Range   Glucose-Capillary 163 (H) 65 - 99 mg/dL  CBC     Status: Abnormal   Collection Time: 04/16/18  3:01 AM  Result Value Ref Range   WBC 5.0 4.0 - 10.5 K/uL   RBC 3.17 (L) 4.22 - 5.81 MIL/uL   Hemoglobin 9.1 (L) 13.0 - 17.0 g/dL   HCT 30.6 (L) 39.0 - 52.0 %   MCV 96.5 78.0 - 100.0 fL   MCH 28.7 26.0 - 34.0 pg   MCHC 29.7 (L) 30.0 - 36.0 g/dL   RDW 16.0 (H) 11.5 - 15.5 %   Platelets 165 150 - 400 K/uL    Comment: Performed at [facility], [place] [address] [place], [place] [address]  Basic metabolic panel     Status: Abnormal  Collection Time: 04/16/18  3:01 AM  Result Value Ref Range   Sodium 143 135 - 145 mmol/L   Potassium 4.4 3.5 - 5.1 mmol/L   Chloride 102 101 - 111 mmol/L   CO2 33 (H) 22 - 32 mmol/L   Glucose, Bld 145 (H) 65 - 99 mg/dL   BUN 44 (H) 6 - 20 mg/dL   Creatinine, Ser 1.38 (H) 0.61 - 1.24 mg/dL   Calcium 8.3 (L) 8.9 - 10.3 mg/dL   GFR calc non Af Amer 49 (L) >60 mL/min   GFR calc Af Amer 57 (L) >60 mL/min    Comment: (NOTE) The eGFR has been calculated using the CKD EPI equation. This calculation has not been validated in all clinical situations. eGFR's persistently <60 mL/min signify possible Chronic Kidney Disease.    Anion gap 8 5 - 15    Comment:  Performed at [facility] [address] [place], [place] [address]  Magnesium     Status: None   Collection Time: 04/16/18  3:01 AM  Result Value Ref Range   Magnesium 2.4 1.7 - 2.4 mg/dL    Comment: Performed at [facility] [address], [place], [place] [address]  Phosphorus     Status: None   Collection Time: 04/16/18  3:01 AM  Result Value Ref Range   Phosphorus 3.1 2.5 - 4.6 mg/dL    Comment: Performed at [facility] [address], [place], [place] [address]  Glucose, capillary     Status: Abnormal   Collection Time: 04/16/18  8:00 AM  Result Value Ref Range   Glucose-Capillary 136 (H) 65 - 99 mg/dL   Comment 1 Notify RN   Glucose, capillary     Status: Abnormal   Collection Time: 04/16/18 11:46 AM  Result Value Ref Range   Glucose-Capillary 214 (H) 65 - 99 mg/dL   Comment 1 Notify RN    Dg Chest Port 1 View  Result Date: 04/16/2018 CLINICAL DATA:  Respiratory failure EXAM: PORTABLE CHEST 1 VIEW COMPARISON:  04/15/2018 FINDINGS: Hypoventilation with decreased lung volume and bibasilar airspace disease right greater than left. Elevated right hemidiaphragm. Progression of right lower lobe airspace disease with small right effusion. IMPRESSION: Hypoventilation with bibasilar atelectasis. Progression of right lower lobe airspace disease which may be pneumonia or atelectasis. Electronically Signed   By: Franchot Gallo M.D.   On: 04/16/2018 09:20   Dg Chest Port 1 View  Result Date: 04/15/2018 CLINICAL DATA:  Respiratory failure. EXAM: PORTABLE CHEST 1 VIEW COMPARISON:  04/14/2018 FINDINGS: Redemonstration of cardiomegaly and aortic atherosclerosis with atelectasis in both lower lungs right worse than left. There may be slightly improved aeration at the left lung base. No worsening or new finding. IMPRESSION: Persistent lower lung atelectasis right more than left. Possible slight improved aeration at the left base. Electronically Signed   By: Nelson Chimes M.D.    On: 04/15/2018 09:24    Medical Problem List and Plan: 1.  Decreased functional mobility/paraparesis secondary to cervical and lumbar myelopathy status post decompression arthrodesis 2.  DVT Prophylaxis/Anticoagulation: SCDs.  Check vascular study 3. Pain Management: Neurontin 400 mg 3 times daily 4. Mood: Lexapro 10 mg daily 5. Neuropsych: This patient is not fully capable of making decisions on his own behalf. 6. Skin/Wound Care: Pressure relief measures.  7. Fluids/Electrolytes/Nutrition: Strict I/O. Heart healthy diet. Monitor voiding function.  8. OSA with acute hypercarbic respiratory failure: Has to use BIPAP or CPAP at nights 9.  Acute on chronic diastolic CHF:  Improved with lasix which has been discontinued and changed to Demadex and hydralazine dose adjusted. Monitor weights daily with strict I/O. [name]    10 Acute on chronic renal failure. Baseline BUN/SCr 29/1.3--> 57/2.3--> 67/2.74. May require further aggressive diuresis.  Continue Demadex 80 mg twice daily as well as hydralazine 50 mg 3 times daily 11. Morbid obesity: BMI 40.46.  12. Hypoxic encephalopathy: Limit sedating medications. Encourage IS. CPAP whenever asleep. 13.  Hypertension.  Lopressor 25 mg twice daily.  Monitor with increased mobility 14.  BPH.  Flomax 0.4 mg daily, Toviaz 8 mg daily.  Check PVR x3 15.  Diabetes mellitus with peripheral neuropathy.  Hemoglobin A1c 7.7.  SSI.  Check blood sugars before meals and at bedtime 16.  Fevers.  Blood cultures no growth.  Urine culture negative.  Completed course of Omnicef 17.  Hypothyroidism.  Synthroid 18.  Constipation.  Laxative assistance  Post Admission Physician Evaluation: 1. Preadmission assessment reviewed and changes made below. 2. Functional deficits secondary  to cervical and lumbar myelopathy. 3. Patient is admitted to receive collaborative, interdisciplinary care between the physiatrist, rehab nursing staff, and therapy team. 4. Patient's level of medical  complexity and substantial therapy needs in context of that medical necessity cannot be provided at a lesser intensity of care such as a SNF. 5. Patient has experienced substantial functional loss from his/her baseline which was documented above under the "Functional History" and "Functional Status" headings.  Judging by the patient's diagnosis, physical exam, and functional history, the patient has potential for functional progress which will result in measurable gains while on inpatient rehab.  These gains will be of substantial and practical use upon discharge  in facilitating mobility and self-care at the household level. 62. Physiatrist will provide 24 hour management of medical needs as well as oversight of the therapy plan/treatment and provide guidance as appropriate regarding the interaction of the two. 7. 24 hour rehab nursing will assist with bladder management, safety, skin/wound care, disease management, pain management and patient education  and help integrate therapy concepts, techniques,education, etc. 8. PT will assess and treat for/with: Lower extremity strength, range of motion, stamina, balance, functional mobility, safety, adaptive techniques and equipment, woundcare, coping skills, pain control, education.   Goals are: Mod/Max A. 9. OT will assess and treat for/with: ADL's, functional mobility, safety, upper extremity strength, adaptive techniques and equipment, wound mgt, ego support, and community reintegration.   Goals are: Mod/Max A. Therapy may not proceed with showering this patient. 10. Case Management and Social Worker will assess and treat for psychological issues and discharge planning. 11. Team conference will be held weekly to assess progress toward goals and to determine barriers to discharge. 12. Patient will receive at least 3 hours of therapy per day at least 5 days per week. 13. ELOS: 18-23 days.       14. Prognosis:  good and fair  I have personally performed a  face to face diagnostic evaluation, including, but not limited to relevant history and physical exam findings, of this patient and developed relevant assessment and plan.  Additionally, I have reviewed and concur with the physician assistant's documentation above.   Delice Lesch, MD, ABPMR Lavon Paganini Angiulli, [name] 04/16/2018

## 2018-04-16 NOTE — Progress Notes (Signed)
Rehab admissions - Patient has been cleared by cardiology.  Bed available and will admit to inpatient rehab today.  Call me for questions.  #[phone]

## 2018-04-17 ENCOUNTER — Inpatient Hospital Stay (HOSPITAL_COMMUNITY): Payer: Medicare Other | Admitting: Occupational Therapy

## 2018-04-17 ENCOUNTER — Inpatient Hospital Stay (HOSPITAL_COMMUNITY): Payer: Medicare Other | Admitting: Physical Therapy

## 2018-04-17 DIAGNOSIS — Z8679 Personal history of other diseases of the circulatory system: Secondary | ICD-10-CM

## 2018-04-17 DIAGNOSIS — G8220 Paraplegia, unspecified: Secondary | ICD-10-CM

## 2018-04-17 DIAGNOSIS — G959 Disease of spinal cord, unspecified: Secondary | ICD-10-CM

## 2018-04-17 LAB — GLUCOSE, CAPILLARY
GLUCOSE-CAPILLARY: 158 mg/dL — AB (ref 65–99)
Glucose-Capillary: 151 mg/dL — ABNORMAL HIGH (ref 65–99)
Glucose-Capillary: 173 mg/dL — ABNORMAL HIGH (ref 65–99)
Glucose-Capillary: 179 mg/dL — ABNORMAL HIGH (ref 65–99)

## 2018-04-17 NOTE — Progress Notes (Signed)
Pt unhappy with usage of bipap unit and complained of mask being uncomfortable and making him dry.  RT spoke with pt about using home cpap.  RT placed home cpap at this time but explained we may need to use our bipap if we can't maintain his O2.  RT made RN aware,  RT will continue to monitor.

## 2018-04-17 NOTE — Progress Notes (Signed)
Physical Therapy Session Note  Patient Details  Name: Travis [name] MRN: [id number] Date of Birth: 03/21/1946  Today's Date: 04/17/2018 PT Individual Time: 1300-1330 PT Individual Time Calculation (min): 30 min   Short Term Goals: Week 1:  PT Short Term Goal 1 (Week 1): Pt will perform supine to/from sit with mod assist x 1 PT Short Term Goal 2 (Week 1): Pt will peform least restrictive transfer bed to/from w/c with max assist x 1 PT Short Term Goal 3 (Week 1): Pt will propel manual w/c x 50 ft with max assist  Skilled Therapeutic Interventions/Progress Updates:    Pt seated in w/c in room, agreeable to PT session. No complaints of pain. Manual w/c propulsion 3 x 30 ft with SBA. Pt on 4L O2, SpO2 drops to 85%, increased to 6L O2. With pursed lip breathing techniques and increase in O2 SpO2 returns to 90% and above with activity. Pt very motivated to participate in therapy session but is limited due to fatigue. Pt left seated in w/c in room with needs in reach.  Therapy Documentation Precautions:  Precautions Precautions: (P) Fall, Back, Cervical Restrictions Weight Bearing Restrictions: (P) No  See Function Navigator for Current Functional Status.   Therapy/Group: Individual Therapy  Excell Seltzer, PT, DPT  04/17/2018, 4:16 PM

## 2018-04-17 NOTE — Progress Notes (Addendum)
Occupational Therapy Assessment and Plan  Patient Details  Name: XAYDEN LINSEY MRN: [id number] Date of Birth: 12/03/1946  OT Diagnosis: abnormal posture, lumbago (low back pain), muscle weakness (generalized) and pain in joint Rehab Potential: Rehab Potential (ACUTE ONLY): Good ELOS: 21-24 days   Today's Date: 04/17/2018 OT Individual Time: 0800-0900  1st session  (60 min)                                     1445-1615  2nd session  (90 min)   Problem List:  Patient Active Problem List   Diagnosis Date Noted  . Myelopathy ([place]) 04/16/2018  . SOB (shortness of breath)   . Hypoxic encephalopathy ([place])   . Benign essential HTN   . Benign prostatic hyperplasia with urinary retention   . Diabetic peripheral neuropathy ([place])   . FUO (fever of unknown origin)   . Atelectasis   . Hypercarbia   . Acute on chronic respiratory failure with hypoxia and hypercapnia (HCC)   . Acute on chronic diastolic heart failure ([place]) 04/09/2018  . Chronic obstructive pulmonary disease ([place])   . DDD (degenerative disc disease), cervical   . Morbid obesity ([place])   . Post-operative pain   . S/P lumbar spinal fusion 04/08/2018  . Paraparesis of both lower limbs ([place]) 11/11/2017  . Myelopathy, spondylogenic, cervical 11/11/2017  . Excessive daytime sleepiness 11/11/2017  . 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 Slow transit constipation   . Diabetes mellitus type 2 in obese ([place])   . Tetraparesis ([place]) 10/08/2017  . Debility 10/08/2017  . Stage 3 chronic kidney disease ([place])   . Spinal stenosis of lumbar region   . OSA (obstructive sleep apnea)   . Acute blood loss anemia   . Fever 10/02/2017  . Urinary tract infection without hematuria   . Chronic diastolic CHF (congestive heart failure) ([place])   . Elevated troponin I level   . Type II diabetes mellitus ([place]) 08/19/2015  . HCAP (healthcare-associated pneumonia) 08/19/2015  . Hyperthyroidism 08/19/2015  . Hypothyroid 08/19/2015  . AKI (acute  kidney injury) ([place]) 08/19/2015  . Blood poisoning   . Restless legs syndrome 03/14/2015  . Shortness of breath 03/09/2015  . Sinus bradycardia 09/21/2014  . Thoracic spondylosis with myelopathy 09/13/2014  . Cervical arthritis with myelopathy 07/18/2014  . Constipation - functional   . CAP (community acquired pneumonia) 07/10/2014  . Sepsis ([place]) 07/10/2014  . HTN (hypertension) 07/10/2014  . OSA on CPAP 07/10/2014  . Acute lower UTI 07/10/2014  . Edema leg 07/10/2014  . Acute respiratory failure with hypoxia ([place]) 07/10/2014  . 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 Other vitamin B12 deficiency anemia 07/13/2013  . 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 Polyneuropathy in other diseases classified elsewhere ([place]) 02/17/2013  . Other general symptoms(780.99) 02/17/2013  . Abnormality of gait 02/17/2013  . Cervical spondylosis with myelopathy 02/17/2013  . Back ache 09/15/2012    Past Medical History:  Past Medical History:  Diagnosis Date  . Cervical myelopathy (HCC)    with myelomalacia at C5-6 and C3-4  . CHF (congestive heart failure) ([place])   . Constipation - functional    controlled with miralax  . COPD (chronic obstructive pulmonary disease) (HCC)    mild  . DJD (degenerative joint disease)    WHOLE SPINE  . Dyspnea   . Gait disorder   . Heart murmur  years ago   . Hypertension   . Hypothyroidism    Low d/t treatment of hyperthyroidism  . 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 Peripheral neuropathy   . Pneumonia   . PONV (postoperative nausea and vomiting)   . RBBB   . Renal disorder    acute kidney failure  . Restless legs syndrome 03/14/2015  . RLS (restless legs syndrome)   . Sleep apnea    DX  2009/2010 study done at [facility]  . 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 Type II diabetes mellitus ([place])    diet controlled   Past Surgical History:  Past Surgical History:  Procedure Laterality Date  . BACK SURGERY     6  surgeries  . CARDIAC CATHETERIZATION [name] 09/07/2015   Procedure: Right/Left Heart Cath and Coronary Angiography;  Surgeon: Peter M Martinique, MD;  Location: [facility];  Service: Cardiovascular;  Laterality: [name];  . CHOLECYSTECTOMY    . EYE SURGERY     BIL CATARACTS  . LUMBAR LAMINECTOMY/DECOMPRESSION MICRODISCECTOMY  07/14/2012   Procedure: LUMBAR LAMINECTOMY/DECOMPRESSION MICRODISCECTOMY 2 LEVELS;  Surgeon: Eustace Moore, MD;  Location: [place] [facility];  Service: Neurosurgery;  Laterality: Left;  Lumbar two three laminectomy, left thoracic four five transpedicular diskectomy  . POSTERIOR CERVICAL FUSION/FORAMINOTOMY  04/08/2018   Procedure: Laminectomy and Foraminotomy - Lumbar Four-Lumbar Five, instrumented posterolateral fusion Lumbar Four- Five, Laminectomy and Foraminotomy - Thoracic One-Thoracic Two;  Surgeon: Eustace Moore, MD;  Location: [facility] OR;  Service: Neurosurgery;;  posterior  . POSTERIOR LUMBAR FUSION  04/08/2018   Laminectomy and Foraminotomy - Lumbar Four-Lumbar Five, instrumented posterolateral fusion Lumbar Four- Five, Laminectomy and Foraminotomy - Thoracic One-Thoracic Two  . POSTERIOR LUMBAR FUSION  04/08/2018   Laminectomy and Foraminotomy - Lumbar Four-Lumbar Five, instrumented posterolateral fusion Lumbar Four- Five, Laminectomy and Foraminotomy - Thoracic One-Thoracic Two    Assessment & Plan Clinical Impression:  SKYLOR SCHNAPP is a 72 year old right-handed male with history of COPD CKD, OSA, diabetes mellitus, combined systolic CHF, DDD with cervical myelopathy and tetraplegia with neuropathy, lumbar decompressive surgery last fall with CIR stay in October 2018. He started declining early January with paraparesis and progressing to paraparesis and inability to walk or perform ADLs due to severe recurrent recurrent lumbar stenosis L4-5 and thoracic spinal stenosis T1-T2. He was admitted 04/08/2018 for decompressive lumbar laminectomy with arthrodesis L4-L5 and partial C7"  T1 laminectomy with medial facetectomy and for venotomy by Dr. Ronnald Ramp. Postop was showing some improvement in bilateral lower extremity strength as well as ability to activate quads and and plantar dorsiflex.   He became obtunded 4/12 pm due to acute on chronic hypercarbic respiratory failure with encephalopathy requiring BIPAP and transfer to step down with follow-up per critical care pulmonary services as well as cardiology. . CXR showed atelectasis with possible consolidation and he was treated with diuresis. Patient currently on IV Lasix with plans to slowly transition to p.o. A recent echocardiogram reviewed from 03/06/2018 showed ejection fraction of 31% grade 2 diastolic dysfunction. He developed fevers with rigors and was started on IV Vanc/Zosyn and later transition to [facility] with course completed. Vancomycin d/c due to worsening of renal function. Follow-up wound care nurse for bilateral lower extremity ischemic changes with Unna boots and dressing care as directed. Acute on chronic anemia hemoglobin 9.4. Physical occupational therapy evaluations completed. Patient was admitted for a comprehensive rehab program. Discussed with Cardiology, who stated patient stable and ready for transfer to CIR.  Patient transferred to CIR on 04/16/2018 .    Patient currently requires total with basic self-care skills secondary to muscle weakness and muscle joint tightness and decreased cardiorespiratoy endurance and decreased oxygen support.  Prior to hospitalization, patient could complete BADL with min.  Patient will benefit from skilled intervention to decrease level of assist with basic self-care skills prior to discharge home with care partner.  Anticipate patient will require intermittent supervision and follow up home health.  OT - End of Session Activity Tolerance: Improving Endurance Deficit: Yes Endurance Deficit Description: needs frequent rest breaks and O2 sats drop with activity OT  Assessment Rehab Potential (ACUTE ONLY): Good OT Patient demonstrates impairments in the following area(s): Balance;Endurance;Motor;Pain OT Basic ADL's Functional Problem(s): Grooming;Bathing;Dressing;Toileting OT Transfers Functional Problem(s): Toilet OT Plan OT Intensity: Minimum of 1-2 x/day, 45 to 90 minutes OT Frequency: 5 out of 7 days OT Duration/Estimated Length of Stay: 21-24 days OT Treatment/Interventions: Balance/vestibular training;Discharge planning;DME/adaptive equipment instruction;Functional mobility training;Pain management;Patient/family education;Self Care/advanced ADL retraining;Therapeutic Activities;Therapeutic Exercise;UE/LE Strength taining/ROM;UE/LE Coordination activities OT Basic Self-Care Anticipated Outcome(s): mod assist OT Toileting Anticipated Outcome(s): mod assist OT Bathroom Transfers Anticipated Outcome(s): mod assist OT Recommendation Patient destination: Home Follow Up Recommendations: Home health OT Equipment Recommended: Wheelchair (measurements)(Pt needs wc)   Skilled Therapeutic Intervention 1session:  Pt supine in bed.  Focus of treatment was bed mobility,  sitting balance, therrapeutic activities, sustained attention, postural control   Agreed to get bathed and dressed.   Ppt was max assist with bed mobility and supine to sit.  Sat EOB for bathing and dressing.  See Function for levels of assist.  Pt returned to supine at end of session.  All needs in place.    2nd session:  Ppt sitting in wc.  Wife, present.  Pt stood in sara plus for 2 times-- each time for 12-15 mins.  Practice LE weight shifting while standing.  Returned to sitting.  Performed LE marching and lifting for strengthening.  Pt wanted to attempt walking, but used sara plus to promote standing tolerance and endurance.  Performed diaphragmatic breathing exercises with instructions to practice on his own.     Wife assisted with set up for [name] with min instructional cues.   Addressed pt goals and plan of care.  Pt/wife agreed.  Returned pt to supine.  Pt complained of neck pain at end of treatment.   Applied heat to neck for 20 minutes.  Left pt with all needs in place.  Wife present in room.    OT Evaluation Precautions/Restrictions  Precautions Precautions: Fall;Back;Cervical Restrictions Weight Bearing Restrictions: No General   Vital Signs Therapy Vitals Temp: 98.5 F (36.9 C) Temp Source: Oral Pulse Rate: (!) 58 BP: 136/61 Patient Position (if appropriate): Sitting Oxygen Therapy SpO2: 93 % O2 Device: Nasal Cannula Pain  :  None reported   Home Living/Prior Functioning Home Living Living Arrangements: Spouse/significant other Available Help at Discharge: Family, Available 24 hours/day, Personal care attendant Type of Home: House Home Access: Ramped entrance Home Layout: One level Bathroom Shower/Tub: Tub/shower unit, Walk-in shower, Curtain Bathroom Toilet: Handicapped height Bathroom Accessibility: Yes Additional Comments: pt had caregivers 3x a week for bathing.  pt's wife was able to assist with transfers and toileting. pt mostly w/c level  Lives With: Spouse IADL History Homemaking Responsibilities: No Current License: No Mode of Transportation: Car Leisure and Hobbies:read  newspaper and tv Prior Function Level of Independence: Needs assistance with ADLs, Needs assistance with homemaking, Needs assistance with gait, Needs  assistance with tranfers  Able to Take Stairs?: No Driving: No Comments: uses w/c and lift chair at home; caregiver 3x/week; wife assists with transfers and toileting   Vision Baseline Vision/History: Wears glasses Vision Assessment?: No apparent visual deficits Perception  Perception: Within Functional Limits    Cognition Overall Cognitive Status: Within Functional Limits for tasks assessed Orientation Level: Person;Place;Situation Person: Oriented Place: Oriented Situation: Oriented Year:  2019 Month: April Day of Week: Correct Memory: Appears intact Immediate Memory Recall: Sock;Blue;Bed Memory Recall: Sock;Blue;Bed Memory Recall Sock: Without Cue Memory Recall Blue: Without Cue Memory Recall Bed: Without Cue Attention: Selective Selective Attention: Appears intact Awareness: Appears intact Problem Solving: Appears intact Executive Function: Reasoning Safety/Judgment: Appears intact Sensation Sensation Light Touch: Impaired by gross assessment(BLE neuropathy) Proprioception: Appears Intact Coordination Gross Motor Movements are Fluid and Coordinated: No(mild tremor in LUE) Fine Motor Movements are Fluid and Coordinated: Yes Finger Nose Finger Test: mild tremor in LUE in F2N Motor  Motor Motor: Other (comment)(paraparesis) Mobility  Bed Mobility Bed Mobility: Sit to Supine;Supine to Sit Supine to Sit: 2: Max assist Supine to Sit Details: Manual facilitation for placement Sit to Supine: 2: Max assist Sit to Supine - Details: Manual facilitation for weight bearing;Manual facilitation for placement  Trunk/Postural Assessment  Cervical Assessment Cervical Assessment: Exceptions to [facility] Cervical PROM Overall Cervical PROM: limitations in all directions Thoracic Assessment Thoracic Assessment: Exceptions to WFL(mild kyphosis) Lumbar Assessment Lumbar Assessment: Within Functional Limits Postural Control Postural Control: Deficits on evaluation(needs mod assist in sitting with BUE and BLE support)  Balance Balance Balance Assessed: Yes Static Sitting Balance Static Sitting - Balance Support: Bilateral upper extremity supported;Feet supported Static Sitting - Level of Assistance: 3: Mod assist Dynamic Sitting Balance Dynamic Sitting - Balance Support: Left upper extremity supported;Feet unsupported Dynamic Sitting - Level of Assistance: 3: Mod assist Dynamic Sitting - Balance Activities: Reaching for objects Static Standing Balance Static Standing -  Balance Support: Bilateral upper extremity supported Static Standing - Level of Assistance: 1: +2 Total assist(in Sara Plus) Extremity/Trunk Assessment RUE Assessment RUE Assessment: Within Functional Limits LUE Assessment LUE Assessment: Within Functional Limits   See Function Navigator for Current Functional Status.   Refer to Care Plan for Long Term Goals  Recommendations for other services: None    Discharge Criteria: Patient will be discharged from OT if patient refuses treatment 3 consecutive times without medical reason, if treatment goals not met, if there is a change in medical status, if patient makes no progress towards goals or if patient is discharged from hospital.  The above assessment, treatment plan, treatment alternatives and goals were discussed and mutually agreed upon: by patient and by family  Lisa Roca 04/17/2018, 4:57 PM

## 2018-04-17 NOTE — Consult Note (Signed)
[facility] Nurse wound consult note Reason for Consult: Maintain Unna's Boots. Patient not seen today; orders on chart of routine maintenance for Unna's Boots (which are to be applied by Ortho Tech following Nursing staff's removal of Unna's Boots, washing and mioisturizing of LEs and placement of a silicone foam dressing over a recently healed area on the left pretibial area).  [place] nursing team will not follow, but will remain available to this patient, the nursing and medical teams.  Please re-consult if needed. Thanks, Maudie Flakes, MSN, RN, [place], Arther Abbott  Pager# [phone]

## 2018-04-17 NOTE — Progress Notes (Signed)
Patient experienced episode of desaturation to 65%. Oxygen at 2L via nasal cannula applied. Saturation only reached 75%. Applied 4L and saturation was at 93%. Primary nurse notified. Will speak to MD regarding O2 order.

## 2018-04-17 NOTE — Progress Notes (Signed)
Subjective/Complaints:   Objective: Vital Signs: Blood pressure (!) 147/58, pulse 72, temperature 97.9 F (36.6 C), temperature source Oral, resp. rate 19, height 6' 1"  (1.854 m), weight (!) 136.5 kg (300 lb 14.9 oz), SpO2 94 %. Dg Chest Port 1 View  Result Date: 04/16/2018 CLINICAL DATA:  Respiratory failure EXAM: PORTABLE CHEST 1 VIEW COMPARISON:  04/15/2018 FINDINGS: Hypoventilation with decreased lung volume and bibasilar airspace disease right greater than left. Elevated right hemidiaphragm. Progression of right lower lobe airspace disease with small right effusion. IMPRESSION: Hypoventilation with bibasilar atelectasis. Progression of right lower lobe airspace disease which may be pneumonia or atelectasis. Electronically Signed   By: Franchot Gallo M.D.   On: 04/16/2018 09:20   Results for orders placed or performed during the hospital encounter of 04/16/18 (from the past 72 hour(s))  CBC WITH DIFFERENTIAL     Status: Abnormal   Collection Time: 04/16/18  3:55 PM  Result Value Ref Range   WBC 5.8 4.0 - 10.5 K/uL   RBC 3.55 (L) 4.22 - 5.81 MIL/uL   Hemoglobin 10.0 (L) 13.0 - 17.0 g/dL   HCT 34.1 (L) 39.0 - 52.0 %   MCV 96.1 78.0 - 100.0 fL   MCH 28.2 26.0 - 34.0 pg   MCHC 29.3 (L) 30.0 - 36.0 g/dL   RDW 15.7 (H) 11.5 - 15.5 %   Platelets 215 150 - 400 K/uL   Neutrophils Relative % 79 %   Neutro Abs 4.6 1.7 - 7.7 K/uL   Lymphocytes Relative 13 %   Lymphs Abs 0.7 0.7 - 4.0 K/uL   Monocytes Relative 6 %   Monocytes Absolute 0.3 0.1 - 1.0 K/uL   Eosinophils Relative 2 %   Eosinophils Absolute 0.1 0.0 - 0.7 K/uL   Basophils Relative 0 %   Basophils Absolute 0.0 0.0 - 0.1 K/uL    Comment: Performed at [facility], [address], [place], [place] [address]  Comprehensive metabolic panel     Status: Abnormal   Collection Time: 04/16/18  3:55 PM  Result Value Ref Range   Sodium 143 135 - 145 mmol/L   Potassium 4.5 3.5 - 5.1 mmol/L   Chloride 100 (L) 101 - 111 mmol/L    CO2 34 (H) 22 - 32 mmol/L   Glucose, Bld 214 (H) 65 - 99 mg/dL   BUN 43 (H) 6 - 20 mg/dL   Creatinine, Ser 1.39 (H) 0.61 - 1.24 mg/dL   Calcium 8.7 (L) 8.9 - 10.3 mg/dL   Total Protein 6.6 6.5 - 8.1 g/dL   Albumin 2.7 (L) 3.5 - 5.0 g/dL   AST 19 15 - 41 U/L   ALT 15 (L) 17 - 63 U/L   Alkaline Phosphatase 66 38 - 126 U/L   Total Bilirubin 0.5 0.3 - 1.2 mg/dL   GFR calc non Af Amer 49 (L) >60 mL/min   GFR calc Af Amer 56 (L) >60 mL/min    Comment: (NOTE) The eGFR has been calculated using the CKD EPI equation. This calculation has not been validated in all clinical situations. eGFR's persistently <60 mL/min signify possible Chronic Kidney Disease.    Anion gap 9 5 - 15    Comment: Performed at [facility] [address], [place], [place] [address]  Glucose, capillary     Status: Abnormal   Collection Time: 04/16/18  5:12 PM  Result Value Ref Range   Glucose-Capillary 210 (H) 65 - 99 mg/dL  Glucose, capillary  Status: Abnormal   Collection Time: 04/16/18  9:26 PM  Result Value Ref Range   Glucose-Capillary 173 (H) 65 - 99 mg/dL   Comment 1 Notify RN   Glucose, capillary     Status: Abnormal   Collection Time: 04/17/18  6:28 AM  Result Value Ref Range   Glucose-Capillary 151 (H) 65 - 99 mg/dL   Comment 1 Notify RN   Glucose, capillary     Status: Abnormal   Collection Time: 04/17/18 11:29 AM  Result Value Ref Range   Glucose-Capillary 158 (H) 65 - 99 mg/dL     HEENT: normal and [place] askew Cardio: RRR and No Murmur Resp: CTA B/L and Unlabored GI: BS positive and NT Extremity:  Edema bilateral pretib with Unna boots Skin:   Other has Unna boots Neuro: Alert/Oriented and Abnormal Motor 4/5 BUE, 2- BLE Musc/Skel:  Other np pain with UE or LE ROM Gen NAD   Assessment/Plan: 1. 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 Physiatrist is providing close team  supervision and 24 hour management of active medical problems listed below. Physiatrist and rehab team continue to assess barriers to discharge/monitor patient progress toward functional and medical goals. FIM: Function - Bathing Position: Sitting EOB Body parts bathed by patient: Right arm, Left arm, Chest, Abdomen, Front perineal area, Left upper leg, Right upper leg Body parts bathed by helper: Right lower leg, Left lower leg, Back, Buttocks Assist Level: Touching or steadying assistance(Pt > 75%)  Function- Upper Body Dressing/Undressing What is the patient wearing?: Pull over shirt/dress Pull over shirt/dress - Perfomed by patient: Thread/unthread right sleeve, Thread/unthread left sleeve, Put head through opening, Pull shirt over trunk Assist Level: Set up Function - Lower Body Dressing/Undressing What is the patient wearing?: Pants Position: Bed Pants- Performed by helper: Thread/unthread right pants leg, Thread/unthread left pants leg, Pull pants up/down Assist for footwear: Dependant Assist for lower body dressing: Touching or steadying assistance (Pt > 75%)        Function - Chair/bed transfer Chair/bed transfer method: Other Chair/bed transfer assist level: 2 helpers Chair/bed transfer assistive device: Mechanical lift Mechanical lift: Clarise Cruz Chair/bed transfer details: Verbal cues for sequencing, Verbal cues for technique, Verbal cues for precautions/safety, Verbal cues for safe use of DME/AE, Manual facilitation for weight shifting, Manual facilitation for placement, Manual facilitation for weight bearing  Function - Locomotion: Wheelchair Will patient use wheelchair at discharge?: Yes Type: Manual Function - Locomotion: Ambulation Ambulation activity did not occur: Safety/medical concerns  Function - Comprehension Comprehension: Auditory Comprehension assist level: Follows basic conversation/direction with no assist  Function - Expression Expression:  Verbal Expression assist level: Expresses basic needs/ideas: With extra time/assistive device  Function - Social Interaction Social Interaction assist level: Interacts appropriately with others with medication or extra time (anti-anxiety, antidepressant).  Function - Problem Solving Problem solving assist level: Solves basic 90% of the time/requires cueing < 10% of the time  Function - Memory Memory assist level: Recognizes or recalls 90% of the time/requires cueing < 10% of the time Patient normally able to recall (first 3 days only): Staff names and faces, That he or she is in a hospital, Current season  Medical Problem List and Plan: 1.  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  DVT Prophylaxis/Anticoagulation: SCDs.  Check vascular study 3. Pain Management: Neurontin 400 mg 3 times daily 4. Mood: Lexapro 10  mg daily 5. Neuropsych: This patient is not fully capable of making decisions on his own behalf. 6. Skin/Wound Care: Pressure relief measures.  7. Fluids/Electrolytes/Nutrition: Strict I/O. Heart healthy diet. Monitor voiding function.  8. OSA with acute hypercarbic respiratory failure: Has to use BIPAP or CPAP at nights 9. Acute on chronic diastolic CHF:  Improved with lasix which has been discontinued and changed to Demadex and hydralazine dose adjusted. Monitor weights daily with strict I/O. [name]    No clinical signs of overload 10 Acute on chronic renal failure. Baseline BUN/SCr 29/1.3--> 57/2.3--> 67/2.74. May require further aggressive diuresis.  Continue Demadex 80 mg twice daily as well as hydralazine 50 mg 3 times daily 11. Morbid obesity: BMI 40.46.  12. Hypoxic encephalopathy: Limit sedating medications. Encourage IS. CPAP whenever asleep. 13.  Hypertension.  Lopressor 25 mg twice daily.  Monitor with increased mobility 14.  BPH.  Flomax 0.4 mg daily, Toviaz 8 mg  daily.  Check PVR x3 15.  Diabetes mellitus with peripheral neuropathy.  Hemoglobin A1c 7.7.  SSI.  Check blood sugars before meals and at bedtime 16.  Fevers.  Blood cultures no growth.  Urine culture negative.  Completed course of Omnicef 17.  Hypothyroidism.  Synthroid 18.  Constipation.  Laxative assistance    LOS (Days) 1 A FACE TO FACE EVALUATION WAS PERFORMED  Charlett Blake 04/17/2018, 12:43 PM

## 2018-04-17 NOTE — Progress Notes (Signed)
Physical Therapy Assessment and Plan  Patient Details  Name: Travis [name] MRN: [id number] Date of Birth: 1946-04-27  PT Diagnosis: Difficulty walking, Impaired sensation and Muscle weakness Rehab Potential: Good ELOS: 21-24 days   Today's Date: 04/17/2018 PT Individual Time: 1100-1200 PT Individual Time Calculation (min): 60 min    Problem List:  Patient Active Problem List   Diagnosis Date Noted  . Myelopathy ([place]) 04/16/2018  . SOB (shortness of breath)   . Hypoxic encephalopathy ([place])   . Benign essential HTN   . Benign prostatic hyperplasia with urinary retention   . Diabetic peripheral neuropathy ([place])   . FUO (fever of unknown origin)   . Atelectasis   . Hypercarbia   . Acute on chronic respiratory failure with hypoxia and hypercapnia (HCC)   . Acute on chronic diastolic heart failure ([place]) 04/09/2018  . Chronic obstructive pulmonary disease ([place])   . DDD (degenerative disc disease), cervical   . Morbid obesity ([place])   . Post-operative pain   . S/P lumbar spinal fusion 04/08/2018  . Paraparesis of both lower limbs ([place]) 11/11/2017  . Myelopathy, spondylogenic, cervical 11/11/2017  . Excessive daytime sleepiness 11/11/2017  . 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 Slow transit constipation   . Diabetes mellitus type 2 in obese ([place])   . Tetraparesis ([place]) 10/08/2017  . Debility 10/08/2017  . Stage 3 chronic kidney disease ([place])   . Spinal stenosis of lumbar region   . OSA (obstructive sleep apnea)   . Acute blood loss anemia   . Fever 10/02/2017  . Urinary tract infection without hematuria   . Chronic diastolic CHF (congestive heart failure) ([place])   . Elevated troponin I level   . Type II diabetes mellitus ([place]) 08/19/2015  . HCAP (healthcare-associated pneumonia) 08/19/2015  . Hyperthyroidism 08/19/2015  . Hypothyroid 08/19/2015  . AKI (acute kidney injury) ([place]) 08/19/2015  . Blood poisoning   . Restless legs syndrome 03/14/2015  . Shortness of breath  03/09/2015  . Sinus bradycardia 09/21/2014  . Thoracic spondylosis with myelopathy 09/13/2014  . Cervical arthritis with myelopathy 07/18/2014  . Constipation - functional   . CAP (community acquired pneumonia) 07/10/2014  . Sepsis ([place]) 07/10/2014  . HTN (hypertension) 07/10/2014  . OSA on CPAP 07/10/2014  . Acute lower UTI 07/10/2014  . Edema leg 07/10/2014  . Acute respiratory failure with hypoxia ([place]) 07/10/2014  . 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 Other vitamin B12 deficiency anemia 07/13/2013  . 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 Polyneuropathy in other diseases classified elsewhere ([place]) 02/17/2013  . Other general symptoms(780.99) 02/17/2013  . Abnormality of gait 02/17/2013  . Cervical spondylosis with myelopathy 02/17/2013  . Back ache 09/15/2012    Past Medical History:  Past Medical History:  Diagnosis Date  . Cervical myelopathy (HCC)    with myelomalacia at C5-6 and C3-4  . CHF (congestive heart failure) ([place])   . Constipation - functional    controlled with miralax  . COPD (chronic obstructive pulmonary disease) (HCC)    mild  . DJD (degenerative joint disease)    WHOLE SPINE  . Dyspnea   . Gait disorder   . Heart murmur    years ago   . Hypertension   . Hypothyroidism    Low d/t treatment of hyperthyroidism  . 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 Peripheral neuropathy   . Pneumonia   . PONV (postoperative nausea and vomiting)   . RBBB   . Renal disorder  acute kidney failure  . Restless legs syndrome 03/14/2015  . RLS (restless legs syndrome)   . Sleep apnea    DX  2009/2010 [facility] at [facility]  . 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 Type II diabetes mellitus ([place])    diet controlled   Past Surgical History:  Past Surgical History:  Procedure Laterality Date  . BACK SURGERY     6 surgeries  . CARDIAC CATHETERIZATION [name] 09/07/2015   Procedure: Right/Left Heart Cath and Coronary Angiography;   Surgeon: Peter M Martinique, MD;  Location: [facility];  Service: Cardiovascular;  Laterality: [name];  . CHOLECYSTECTOMY    . EYE SURGERY     BIL CATARACTS  . LUMBAR LAMINECTOMY/DECOMPRESSION MICRODISCECTOMY  07/14/2012   Procedure: LUMBAR LAMINECTOMY/DECOMPRESSION MICRODISCECTOMY 2 LEVELS;  Surgeon: Eustace Moore, MD;  Location: [facility];  Service: Neurosurgery;  Laterality: Left;  Lumbar two three laminectomy, left thoracic four five transpedicular diskectomy  . POSTERIOR CERVICAL FUSION/FORAMINOTOMY  04/08/2018   Procedure: Laminectomy and Foraminotomy - Lumbar Four-Lumbar Five, instrumented posterolateral fusion Lumbar Four- Five, Laminectomy and Foraminotomy - Thoracic One-Thoracic Two;  Surgeon: Eustace Moore, MD;  Location: [facility] OR;  Service: Neurosurgery;;  posterior  . POSTERIOR LUMBAR FUSION  04/08/2018   Laminectomy and Foraminotomy - Lumbar Four-Lumbar Five, instrumented posterolateral fusion Lumbar Four- Five, Laminectomy and Foraminotomy - Thoracic One-Thoracic Two  . POSTERIOR LUMBAR FUSION  04/08/2018   Laminectomy and Foraminotomy - Lumbar Four-Lumbar Five, instrumented posterolateral fusion Lumbar Four- Five, Laminectomy and Foraminotomy - Thoracic One-Thoracic Two    Assessment & Plan Clinical Impression: Travis [name] is a 72 year old right-handed male with history of COPD CKD, OSA, diabetes mellitus, combined systolic CHF, DDD with cervical myelopathy and tetraplegia with neuropathy, lumbar decompressive surgery last fall with CIR stay in October 2018.  He started declining early January with paraparesis and progressing to paraparesis and inability to walk or perform ADLs due to severe recurrent recurrent lumbar stenosis L4-5 and thoracic spinal stenosis T1-T2.  He was admitted 04/08/2018 for decompressive lumbar laminectomy with arthrodesis L4-L5 and partial C7" T1 laminectomy with medial facetectomy and for venotomy by Dr. Ronnald Ramp.  Postop was showing some improvement in  bilateral lower extremity strength as well as ability to activate quads and and plantar dorsiflex.    He became obtunded 4/12 pm  due to acute on chronic hypercarbic respiratory failure with encephalopathy requiring BIPAP and transfer to step down with follow-up per critical care pulmonary services as well as cardiology. . CXR showed atelectasis with possible consolidation and he was treated with diuresis.  Patient currently on IV Lasix with plans to slowly transition to p.o.  A recent echocardiogram reviewed from 03/06/2018 showed ejection fraction of 60% grade 2 diastolic dysfunction.  He developed fevers with rigors  and was started on IV Vanc/Zosyn and later transition to [facility] with course completed.  Vancomycin d/c due to worsening of renal function.  Follow-up wound care nurse for bilateral lower extremity ischemic changes with Unna boots and dressing care as directed.  Acute on chronic anemia hemoglobin 9.4.  Physical occupational therapy evaluations completed.  Patient was admitted for a comprehensive rehab program. Discussed with Cardiology, who stated patient stable and ready for transfer to CIR.  Patient transferred to CIR on 04/16/2018 .   Patient currently requires total with mobility secondary to muscle weakness, decreased cardiorespiratoy endurance and decreased sitting balance, decreased standing balance, decreased postural control and decreased balance strategies.  Prior to  hospitalization, patient was min with mobility and lived with Spouse in a House home.  Home access is  Ramped entrance.  Patient will benefit from skilled PT intervention to maximize safe functional mobility, minimize fall risk and decrease caregiver burden for planned discharge home with 24 hour assist.  Anticipate patient will benefit from follow up [facility] at discharge.  PT - End of Session Activity Tolerance: Tolerates < 10 min activity with changes in vital signs Endurance Deficit: Yes Endurance Deficit Description:  needs frequent rest breaks and O2 sats drop with activity PT Assessment Rehab Potential (ACUTE/IP ONLY): Good PT Barriers to Discharge: Medical stability;New oxygen PT Patient demonstrates impairments in the following area(s): Balance;Edema;Endurance;Motor;Sensory PT Transfers Functional Problem(s): Bed Mobility;Bed to Chair;Car;Furniture;Floor PT Locomotion Functional Problem(s): Ambulation;Wheelchair Mobility PT Plan PT Intensity: Minimum of 1-2 x/day ,45 to 90 minutes PT Frequency: 5 out of 7 days PT Duration Estimated Length of Stay: 21-24 days PT Treatment/Interventions: Ambulation/gait training;Balance/vestibular training;Community reintegration;Discharge planning;DME/adaptive equipment instruction;Functional mobility training;Neuromuscular re-education;Pain management;Patient/family education;Psychosocial support;Therapeutic Activities;Therapeutic Exercise;UE/LE Strength taining/ROM;UE/LE Coordination activities;Wheelchair propulsion/positioning PT Transfers Anticipated Outcome(s): Mod Assist PT Locomotion Anticipated Outcome(s): Min Assist at w/c level PT Recommendation Recommendations for Other Services: Therapeutic Recreation consult Therapeutic Recreation Interventions: Stress management;Outing/community reintergration Follow Up Recommendations: Home health PT Patient destination: Home Equipment Recommended: Other (comment)(pt already owns necessary equipment)  Skilled Therapeutic Intervention Evaluation completed (see details above and below) with education on PT POC and goals and individual treatment initiated with focus on bed mobility, transfers, education with PT and family regarding rehab expectations and POC. No complaints of pain. Bed mobility rolling L/R with mod assist and use of bedrails, max assist to don pants. Supine to sit max assist with HOB elevated and use of bedrails. Dependent to don LSO while seated EOB. Mod assist for sitting balance EOB with BUE and LE  supported. Sit to stand with [name] assist x 2. Transfer bed to high-back w/c with [name] and assist x 2. Pt SpO2 drops to 80% with activity while on room air, returns to 90% (+) with 4L O2. Pt left seated in high-back w/c in room with needs in reach.  PT Evaluation Precautions/Restrictions Precautions Precautions: Fall;Back;Cervical Restrictions Weight Bearing Restrictions: No Home Living/Prior Functioning Home Living Available Help at Discharge: Family;Available 24 hours/day;Personal care attendant Type of Home: House Home Access: Ramped entrance Home Layout: One level  Lives With: Spouse Prior Function Level of Independence: Needs assistance with ADLs;Needs assistance with homemaking;Needs assistance with gait;Needs assistance with tranfers  Able to Take Stairs?: No Driving: No Comments: uses w/c and lift chair at home; caregiver 3x/week; wife assists with transfers and toileting Cognition Orientation Level: Oriented X4 Safety/Judgment: Appears intact Sensation Sensation Light Touch: Impaired by gross assessment(BLE neuropathy) Proprioception: Appears Intact Coordination Gross Motor Movements are Fluid and Coordinated: No Motor  Motor Motor: Other (comment)(paraparesis)  Trunk/Postural Assessment  Thoracic Assessment Thoracic Assessment: Exceptions to WFL(mild kyphosis) Lumbar Assessment Lumbar Assessment: Within Functional Limits Postural Control Postural Control: Deficits on evaluation(needs mod assist in sitting with BUE and BLE support)  Balance Balance Balance Assessed: Yes Static Sitting Balance Static Sitting - Balance Support: Bilateral upper extremity supported;Feet supported Static Sitting - Level of Assistance: 3: Mod assist Dynamic Sitting Balance Dynamic Sitting - Balance Support: Left upper extremity supported;Feet unsupported Dynamic Sitting - Level of Assistance: 3: Mod assist Dynamic Sitting - Balance Activities: Reaching for objects Static  Standing Balance Static Standing - Balance Support: Bilateral upper extremity supported Static Standing - Level of Assistance: 1: +  2 Total assist(in [name]) Extremity Assessment   RLE Assessment RLE Assessment: Exceptions to [facility] RLE Strength Right Hip Flexion: 3/5 Right Knee Flexion: 4/5 Right Knee Extension: 4/5 Right Ankle Dorsiflexion: 3/5 LLE Assessment LLE Assessment: Exceptions to [facility] LLE Strength Left Hip Flexion: 3/5 Left Knee Flexion: 4+/5 Left Knee Extension: 4+/5 Left Ankle Dorsiflexion: 3/5   See Function Navigator for Current Functional Status.   Refer to Care Plan for Long Term Goals  Recommendations for other services: Therapeutic Recreation  Stress management and Outing/community reintegration  Discharge Criteria: Patient will be discharged from PT if patient refuses treatment 3 consecutive times without medical reason, if treatment goals not met, if there is a change in medical status, if patient makes no progress towards goals or if patient is discharged from hospital.  The above assessment, treatment plan, treatment alternatives and goals were discussed and mutually agreed upon: by patient and by family  Excell Seltzer, PT, DPT  04/17/2018, 12:20 PM

## 2018-04-18 ENCOUNTER — Inpatient Hospital Stay (HOSPITAL_COMMUNITY): Payer: Medicare Other

## 2018-04-18 DIAGNOSIS — M7989 Other specified soft tissue disorders: Secondary | ICD-10-CM

## 2018-04-18 LAB — GLUCOSE, CAPILLARY
GLUCOSE-CAPILLARY: 200 mg/dL — AB (ref 65–99)
GLUCOSE-CAPILLARY: 227 mg/dL — AB (ref 65–99)
Glucose-Capillary: 172 mg/dL — ABNORMAL HIGH (ref 65–99)
Glucose-Capillary: 382 mg/dL — ABNORMAL HIGH (ref 65–99)

## 2018-04-18 MED ORDER — SENNA 8.6 MG PO TABS
2.0000 | ORAL_TABLET | Freq: Two times a day (BID) | ORAL | Status: DC
  Administered 2018-04-18 – 2018-05-12 (×45): 17.2 mg via ORAL
  Filled 2018-04-18 (×48): qty 2

## 2018-04-18 MED ORDER — INSULIN ASPART 100 UNIT/ML ~~LOC~~ SOLN
5.0000 [IU] | Freq: Once | SUBCUTANEOUS | Status: AC
  Administered 2018-04-18: 5 [IU] via SUBCUTANEOUS

## 2018-04-18 NOTE — Progress Notes (Signed)
Subjective/Complaints: Itching at back of neck, no increased pain  Constipation but no abdominal pain   View of systems negative chest pain shortness of breath nausea vomiting diarrhea, positive constipation  Objective: Vital Signs: Blood pressure (!) 147/63, pulse (!) 57, temperature 98.9 F (37.2 C), temperature source Oral, resp. rate 18, height 6' 1"  (1.854 m), weight 134.3 kg (296 lb), SpO2 96 %. No results found. Results for orders placed or performed during the hospital encounter of 04/16/18 (from the past 72 hour(s))  CBC WITH DIFFERENTIAL     Status: Abnormal   Collection Time: 04/16/18  3:55 PM  Result Value Ref Range   WBC 5.8 4.0 - 10.5 K/uL   RBC 3.55 (L) 4.22 - 5.81 MIL/uL   Hemoglobin 10.0 (L) 13.0 - 17.0 g/dL   HCT 34.1 (L) 39.0 - 52.0 %   MCV 96.1 78.0 - 100.0 fL   MCH 28.2 26.0 - 34.0 pg   MCHC 29.3 (L) 30.0 - 36.0 g/dL   RDW 15.7 (H) 11.5 - 15.5 %   Platelets 215 150 - 400 K/uL   Neutrophils Relative % 79 %   Neutro Abs 4.6 1.7 - 7.7 K/uL   Lymphocytes Relative 13 %   Lymphs Abs 0.7 0.7 - 4.0 K/uL   Monocytes Relative 6 %   Monocytes Absolute 0.3 0.1 - 1.0 K/uL   Eosinophils Relative 2 %   Eosinophils Absolute 0.1 0.0 - 0.7 K/uL   Basophils Relative 0 %   Basophils Absolute 0.0 0.0 - 0.1 K/uL    Comment: Performed at [facility], [address], [place], [place] [address]  Comprehensive metabolic panel     Status: Abnormal   Collection Time: 04/16/18  3:55 PM  Result Value Ref Range   Sodium 143 135 - 145 mmol/L   Potassium 4.5 3.5 - 5.1 mmol/L   Chloride 100 (L) 101 - 111 mmol/L   CO2 34 (H) 22 - 32 mmol/L   Glucose, Bld 214 (H) 65 - 99 mg/dL   BUN 43 (H) 6 - 20 mg/dL   Creatinine, Ser 1.39 (H) 0.61 - 1.24 mg/dL   Calcium 8.7 (L) 8.9 - 10.3 mg/dL   Total Protein 6.6 6.5 - 8.1 g/dL   Albumin 2.7 (L) 3.5 - 5.0 g/dL   AST 19 15 - 41 U/L   ALT 15 (L) 17 - 63 U/L   Alkaline Phosphatase 66 38 - 126 U/L   Total Bilirubin 0.5 0.3 - 1.2 mg/dL   GFR calc non Af Amer 49 (L) >60 mL/min   GFR calc Af Amer 56 (L) >60 mL/min    Comment: (NOTE) The eGFR has been calculated using the CKD EPI equation. This calculation has not been validated in all clinical situations. eGFR's persistently <60 mL/min signify possible Chronic Kidney Disease.    Anion gap 9 5 - 15    Comment: Performed at [facility] [address] [place], [place] [address]  Glucose, capillary     Status: Abnormal   Collection Time: 04/16/18  5:12 PM  Result Value Ref Range   Glucose-Capillary 210 (H) 65 - 99 mg/dL  Glucose, capillary     Status: Abnormal   Collection Time: 04/16/18  9:26 PM  Result Value Ref Range   Glucose-Capillary 173 (H) 65 - 99 mg/dL   Comment 1 Notify RN   Glucose, capillary     Status: Abnormal   Collection Time: 04/17/18  6:28 AM  Result Value Ref Range  Glucose-Capillary 151 (H) 65 - 99 mg/dL   Comment 1 Notify RN   Glucose, capillary     Status: Abnormal   Collection Time: 04/17/18 11:29 AM  Result Value Ref Range   Glucose-Capillary 158 (H) 65 - 99 mg/dL  Glucose, capillary     Status: Abnormal   Collection Time: 04/17/18  4:26 PM  Result Value Ref Range   Glucose-Capillary 173 (H) 65 - 99 mg/dL  Glucose, capillary     Status: Abnormal   Collection Time: 04/17/18  9:14 PM  Result Value Ref Range   Glucose-Capillary 179 (H) 65 - 99 mg/dL  Glucose, capillary     Status: Abnormal   Collection Time: 04/18/18  7:23 AM  Result Value Ref Range   Glucose-Capillary 200 (H) 65 - 99 mg/dL     HEENT: normal  Cardio: RRR and No Murmur Resp: CTA B/L and Unlabored GI: BS positive and NT Extremity:  Edema bilateral pretib with Unna boots Skin:   Other has Unna boots, incision with dried serosanguineous drainage posterior cervical Steri-Strips in place no swelling or erythema Neuro: Alert/Oriented and Abnormal Motor 4/5 BUE, 2- BLE Musc/Skel:  Other np pain with UE or LE ROM Gen NAD   Assessment/Plan: 1. 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 Physiatrist is providing close team supervision and 24 hour management of active medical problems listed below. Physiatrist and rehab team continue to assess barriers to discharge/monitor patient progress toward functional and medical goals. FIM: Function - Bathing Position: Sitting EOB Body parts bathed by patient: Right arm, Left arm, Chest, Abdomen, Front perineal area, Left upper leg, Right upper leg Body parts bathed by helper: Right lower leg, Left lower leg, Back, Buttocks Assist Level: Touching or steadying assistance(Pt > 75%)  Function- Upper Body Dressing/Undressing What is the patient wearing?: Pull over shirt/dress Pull over shirt/dress - Perfomed by patient: Thread/unthread right sleeve, Thread/unthread left sleeve, Put head through opening, Pull shirt over trunk Assist Level: Set up Function - Lower Body Dressing/Undressing What is the patient wearing?: Pants Position: Bed Pants- Performed by helper: Thread/unthread right pants leg, Thread/unthread left pants leg, Pull pants up/down Assist for footwear: Dependant Assist for lower body dressing: Touching or steadying assistance (Pt > 75%)     Function - Toilet Transfers Assist level to toilet: Touching or steadying assistance (Pt > 75%) Assist level from toilet: Touching or steadying assistance (Pt > 75%)  Function - Chair/bed transfer Chair/bed transfer method: Other Chair/bed transfer assist level: 2 helpers Chair/bed transfer assistive device: Mechanical lift Mechanical lift: Clarise Cruz Chair/bed transfer details: Verbal cues for sequencing, Verbal cues for technique, Verbal cues for precautions/safety, Verbal cues for safe use of DME/AE, Manual facilitation for weight shifting, Manual facilitation for placement, Manual facilitation for weight bearing  Function - Locomotion: Wheelchair Will  patient use wheelchair at discharge?: Yes Type: Manual Max wheelchair distance: 30' Assist Level: Supervision or verbal cues Wheel 50 feet with 2 turns activity did not occur: Safety/medical concerns Wheel 150 feet activity did not occur: Safety/medical concerns Turns around,maneuvers to table,bed, and toilet,negotiates 3% grade,maneuvers on rugs and over doorsills: No Function - Locomotion: Ambulation Ambulation activity did not occur: Safety/medical concerns  Function - Comprehension Comprehension: Auditory Comprehension assist level: Follows basic conversation/direction with no assist  Function - Expression Expression: Verbal Expression assist level: Expresses basic needs/ideas: With extra time/assistive device  Function - Social Interaction Social Interaction assist level: Interacts appropriately  with others with medication or extra time (anti-anxiety, antidepressant).  Function - Problem Solving Problem solving assist level: Solves basic 90% of the time/requires cueing < 10% of the time  Function - Memory Memory assist level: Recognizes or recalls 90% of the time/requires cueing < 10% of the time Patient normally able to recall (first 3 days only): Staff names and faces, That he or she is in a hospital, Current season  Medical Problem List and Plan: 1.  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  DVT Prophylaxis/Anticoagulation: SCDs.  Check vascular study 3. Pain Management: Neurontin 400 mg 3 times daily 4. Mood: Lexapro 10 mg daily 5. Neuropsych: This patient is not fully capable of making decisions on his own behalf. 6. Skin/Wound Care: Pressure relief measures.  7. Fluids/Electrolytes/Nutrition: Strict I/O. Heart healthy diet. Monitor voiding function.  8. OSA with acute hypercarbic respiratory failure: Has to use BIPAP or CPAP at nights 9. Acute on chronic diastolic  CHF:  Improved with lasix which has been discontinued and changed to Demadex and hydralazine dose adjusted. Monitor weights daily with strict I/O. [name]    No clinical signs of overload 10 Acute on chronic renal failure. Baseline BUN/SCr 29/1.3--> 57/2.3--> 67/2.74. May require further aggressive diuresis.  Continue Demadex 80 mg twice daily as well as hydralazine 50 mg 3 times daily 11. Morbid obesity: BMI 40.46.  12. Hypoxic encephalopathy: Clearing limit sedating medications. Encourage IS. CPAP whenever asleep. 13.  Hypertension.  Lopressor 25 mg twice daily.  Monitor with increased mobility 14.  BPH.  Flomax 0.4 mg daily, Toviaz 8 mg daily.  Check PVR x3 15.  Diabetes mellitus with peripheral neuropathy.  Hemoglobin A1c 7.7.  SSI.  Check blood sugars before meals and at bedtime 16.  Fevers.  Blood cultures no growth.  Urine culture negative.  Completed course of Omnicef 17.  Hypothyroidism.  Synthroid 18.  Constipation.  Laxative assistance, increase Senokot to 2 tablets twice a day, continue MiraLAX 1 packet/day    LOS (Days) 2 A FACE TO FACE EVALUATION WAS PERFORMED  Charlett Blake 04/18/2018, 9:25 AM

## 2018-04-18 NOTE — Progress Notes (Signed)
LE venous duplex prelim: no evidence of DVT, however unable to image calf veins due to unna boots. Landry Mellow, RDMS, RVT

## 2018-04-18 NOTE — Progress Notes (Signed)
Occupational Therapy Session Note  Patient Details  Name: Travis [name] MRN: [id number] Date of Birth: August 07, 1946  Today's Date: 04/18/2018 OT Individual Time: 1400-1445 OT Individual Time Calculation (min): 45 min    Short Term Goals: Week 1:  OT Short Term Goal 1 (Week 1): Pt will maintain standing tolerance in stedy for20 minutes while performing grooming at sink OT Short Term Goal 2 (Week 1): Pt will perform diaphramitic breathing strategies with 1 questioning cue OT Short Term Goal 3 (Week 1): Pt will transfer from sit to stand from elevated surface with max assist to prepare for donning pants. OT Short Term Goal 4 (Week 1): Pt will verbalize 3/3 back precautions with no cues.    Skilled Therapeutic Interventions/Progress Updates:    1;1. Pt requesting to work on walking today and no c/o pain. Educated on pt of safety concern walking at this time but able to work on Metallurgist as precursor to walking for functional mobility. Pt propels w/c to/from all tx destinations with increased time and A for steering around turn. Pt completes 3x2 min intervals of kinetron for BLE strengthening required for functional transfers, endurance and reciprocal movement training with preferred music choice playing in background. OT discusses PLOF with pt as completing exercise. Pt O2 WNL throughout session. Pt completes 1x30 volleys of beach ball for BUE coordination/strengthening with 5# dowel rod. Exited session with pt seatedin w/c and call light in reac  Therapy Documentation Precautions:  Precautions Precautions: Fall, Back, Cervical Restrictions Weight Bearing Restrictions: No General:  See Function Navigator for Current Functional Status.   Therapy/Group: Individual Therapy  Tonny Branch 04/18/2018, 2:45 PM

## 2018-04-19 ENCOUNTER — Inpatient Hospital Stay (HOSPITAL_COMMUNITY): Payer: Medicare Other

## 2018-04-19 ENCOUNTER — Inpatient Hospital Stay (HOSPITAL_COMMUNITY): Payer: Medicare Other | Admitting: Occupational Therapy

## 2018-04-19 ENCOUNTER — Inpatient Hospital Stay (HOSPITAL_COMMUNITY): Payer: Medicare Other | Admitting: Physical Therapy

## 2018-04-19 DIAGNOSIS — E1142 Type 2 diabetes mellitus with diabetic polyneuropathy: Secondary | ICD-10-CM

## 2018-04-19 DIAGNOSIS — N189 Chronic kidney disease, unspecified: Secondary | ICD-10-CM

## 2018-04-19 DIAGNOSIS — I10 Essential (primary) hypertension: Secondary | ICD-10-CM

## 2018-04-19 DIAGNOSIS — N170 Acute kidney failure with tubular necrosis: Secondary | ICD-10-CM

## 2018-04-19 DIAGNOSIS — I5040 Unspecified combined systolic (congestive) and diastolic (congestive) heart failure: Secondary | ICD-10-CM

## 2018-04-19 DIAGNOSIS — I5033 Acute on chronic diastolic (congestive) heart failure: Secondary | ICD-10-CM

## 2018-04-19 LAB — GLUCOSE, CAPILLARY
GLUCOSE-CAPILLARY: 171 mg/dL — AB (ref 65–99)
GLUCOSE-CAPILLARY: 205 mg/dL — AB (ref 65–99)
Glucose-Capillary: 212 mg/dL — ABNORMAL HIGH (ref 65–99)
Glucose-Capillary: 157 mg/dL — ABNORMAL HIGH (ref 65–99)
Glucose-Capillary: 186 mg/dL — ABNORMAL HIGH (ref 65–99)

## 2018-04-19 MED ORDER — SORBITOL 70 % SOLN
60.0000 mL | Status: AC
  Administered 2018-04-19: 60 mL via ORAL
  Filled 2018-04-19: qty 60

## 2018-04-19 MED ORDER — BISACODYL 10 MG RE SUPP: 10.0000 mg | Freq: Every day | RECTAL | Status: DC | PRN

## 2018-04-19 MED ORDER — GLIMEPIRIDE 1 MG PO TABS
1.0000 mg | ORAL_TABLET | Freq: Every day | ORAL | Status: DC
  Administered 2018-04-19 – 2018-05-07 (×19): 1 mg via ORAL
  Filled 2018-04-19 (×19): qty 1

## 2018-04-19 NOTE — Progress Notes (Signed)
Social Work  Social Work Assessment and Plan  Patient Details  Name: Travis [name] MRN: [id number] Date of Birth: 1946-07-10  Today's Date: 04/19/2018  Problem List:  Patient Active Problem List   Diagnosis Date Noted  . Hypokalemia   . Type 2 diabetes mellitus with peripheral neuropathy (HCC)   . Acute on chronic diastolic (congestive) heart failure ([place])   . Myelopathy ([place]) 04/16/2018  . SOB (shortness of breath)   . Hypoxic encephalopathy ([place])   . Benign essential HTN   . Benign prostatic hyperplasia with urinary retention   . Diabetic peripheral neuropathy ([place])   . FUO (fever of unknown origin)   . Atelectasis   . Hypercarbia   . Acute on chronic respiratory failure with hypoxia and hypercapnia (HCC)   . Acute on chronic diastolic heart failure ([place]) 04/09/2018  . Chronic obstructive pulmonary disease ([place])   . DDD (degenerative disc disease), cervical   . Morbid obesity ([place])   . Post-operative pain   . S/P lumbar spinal fusion 04/08/2018  . Paraparesis of both lower limbs ([place]) 11/11/2017  . Myelopathy, spondylogenic, cervical 11/11/2017  . Excessive daytime sleepiness 11/11/2017  . 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 Slow transit constipation   . Diabetes mellitus type 2 in obese ([place])   . 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 Debility 10/08/2017  . Stage 3 chronic kidney disease ([place])   . Spinal stenosis of lumbar region   . OSA (obstructive sleep apnea)   . Acute blood loss anemia   . Fever 10/02/2017  . Urinary tract infection without hematuria   . Chronic diastolic CHF (congestive heart failure) ([place])   . Elevated troponin I level   . Type II diabetes mellitus ([place]) 08/19/2015  . HCAP (healthcare-associated pneumonia) 08/19/2015  . Hyperthyroidism 08/19/2015  . Hypothyroid 08/19/2015  . AKI (acute kidney injury) ([place]) 08/19/2015  . Blood poisoning   . Restless legs syndrome 03/14/2015  . Shortness of breath 03/09/2015  . Sinus bradycardia 09/21/2014   . Thoracic spondylosis with myelopathy 09/13/2014  . Cervical arthritis with myelopathy 07/18/2014  . Constipation - functional   . CAP (community acquired pneumonia) 07/10/2014  . Sepsis ([place]) 07/10/2014  . HTN (hypertension) 07/10/2014  . OSA on CPAP 07/10/2014  . Acute lower UTI 07/10/2014  . Edema leg 07/10/2014  . Acute respiratory failure with hypoxia ([place]) 07/10/2014  . 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 Other vitamin B12 deficiency anemia 07/13/2013  . 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 Polyneuropathy in other diseases classified elsewhere ([place]) 02/17/2013  . Other general symptoms(780.99) 02/17/2013  . Abnormality of gait 02/17/2013  . Cervical spondylosis with myelopathy 02/17/2013  . Back ache 09/15/2012   Past Medical History:  Past Medical History:  Diagnosis Date  . Cervical myelopathy (HCC)    with myelomalacia at C5-6 and C3-4  . CHF (congestive heart failure) ([place])   . Constipation - functional    controlled with miralax  . COPD (chronic obstructive pulmonary disease) (HCC)    mild  . DJD (degenerative joint disease)    WHOLE SPINE  . Dyspnea   . Gait disorder   . Heart murmur    years ago   . Hypertension   . Hypothyroidism    Low d/t treatment of hyperthyroidism  . 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 Peripheral neuropathy   . Pneumonia   . PONV (postoperative nausea and vomiting)   . RBBB   . Renal disorder  acute kidney failure  . Restless legs syndrome 03/14/2015  . RLS (restless legs syndrome)   . Sleep apnea    DX  2009/2010 [facility]  . 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 Type II diabetes mellitus ([place])    diet controlled   Past Surgical History:  Past Surgical History:  Procedure Laterality Date  . BACK SURGERY     6 surgeries  . CARDIAC CATHETERIZATION [name] 09/07/2015   Procedure: Right/Left Heart Cath and Coronary Angiography;  Surgeon: Peter M Martinique, MD;  Location: [facility];  Service: Cardiovascular;  Laterality: [name];  . CHOLECYSTECTOMY    . EYE SURGERY     BIL CATARACTS  . LUMBAR LAMINECTOMY/DECOMPRESSION MICRODISCECTOMY  07/14/2012   Procedure: LUMBAR LAMINECTOMY/DECOMPRESSION MICRODISCECTOMY 2 LEVELS;  Surgeon: Eustace Moore, MD;  Location: [facility];  Service: Neurosurgery;  Laterality: Left;  Lumbar two three laminectomy, left thoracic four five transpedicular diskectomy  . POSTERIOR CERVICAL FUSION/FORAMINOTOMY  04/08/2018   Procedure: Laminectomy and Foraminotomy - Lumbar Four-Lumbar Five, instrumented posterolateral fusion Lumbar Four- Five, Laminectomy and Foraminotomy - Thoracic One-Thoracic Two;  Surgeon: Eustace Moore, MD;  Location: [facility];  Service: Neurosurgery;;  posterior  . POSTERIOR LUMBAR FUSION  04/08/2018   Laminectomy and Foraminotomy - Lumbar Four-Lumbar Five, instrumented posterolateral fusion Lumbar Four- Five, Laminectomy and Foraminotomy - Thoracic One-Thoracic Two  . POSTERIOR LUMBAR FUSION  04/08/2018   Laminectomy and Foraminotomy - Lumbar Four-Lumbar Five, instrumented posterolateral fusion Lumbar Four- Five, Laminectomy and Foraminotomy - Thoracic One-Thoracic Two   Social History:  reports that he quit smoking about 32 years ago. His smoking use included cigarettes. He has a 40.00 pack-year smoking history. He quit smokeless tobacco use about 32 years ago. He reports that he does not drink alcohol or use drugs.  Family / Support Systems Marital Status: Married How Long?: 76 yrs Patient Roles: Spouse, Parent Spouse/Significant Other: wife, Travis [name] @ [phone] or Travis [name] Children: daughter, Travis [name] @ (C) [phone];  son, Travis [name] @ [phone] and son, Travis [name] Anticipated Caregiver: Wife and son, Travis [name], who has moved back into the home to assist Ability/Limitations of Caregiver: Wife is retired and can assist.  Son works but can assist when not at work. Caregiver Availability:  24/7 Family Dynamics: Pt describes wife and family as very supportive, however, he does worry about continuing to place the caregiver burdens on them.  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  Sons are assisting with this as well. Return to Work Plans: Pt hopeful he will be able to return. Legal Hisotry/Current Legal Issues: None Guardian/Conservator: None - per MD, pt is capable of making decisions on his own behalf.   Abuse/Neglect Abuse/Neglect Assessment Can Be Completed: Yes Physical Abuse: Denies Verbal Abuse: Denies Sexual Abuse: Denies Exploitation of patient/patient's resources: Denies Self-Neglect: Denies  Emotional Status Pt's affect, behavior adn adjustment status: Pt sitting up in w/c and pleasant as he completes the assessment interview without any difficulty.  He admits much frustration with chronic back issues and several past surgeries.  He denies any significant emotional distress, however, may benefit from additional support of neuropsychology. Recent Psychosocial Issues: This is his 4th CIR admit and with chronic, physical limitations due to stenosis, etc. Pyschiatric History: None Substance Abuse History: None  Patient / Family Perceptions, Expectations & Goals Pt/Family understanding of illness & functional limitations: Patient and wife with good understanding of patient's significant spinal stenosis and need for further surgery.  Good understanding of his current functional limitations/need for CIR. Premorbid pt/family roles/activities: Patient was independent overall until December when functional decline began as well as bladder incontinence.  Patient mostly wheelchair-bound from that point forward.  Son did move in with patient and wife to assist when the decline  began. Anticipated changes in roles/activities/participation: Little changes to roles anticipated with patient having a mod assist wheelchair level goals. Pt/family expectations/goals: Patient and family hopeful he will improve greater than expected goal levels as patient does not want to continue to place burden on his family.  Community [employer] Agencies: None Premorbid Home Care/DME Agencies: Other (Comment) Transportation available at discharge: Yes Resource referrals recommended: Neuropsychology  Discharge Planning Living Arrangements: Spouse/significant other, Children Support Systems: Spouse/significant other, Children Type of Residence: Private residence Insurance Resources: Medicare(BCBS Medicare) Financial Resources: Social Security Financial Screen Referred: No Living Expenses: Own Money Management: Patient, Spouse Does the patient have any problems obtaining your medications?: No Home Management: Wife is primary support of the household. Patient/Family Preliminary Plans: At time of CIR admit, plan is for patient to DC home with wife and son. Social Work Anticipated Follow Up Needs: HH/OP Expected length of stay: 21-24 days  Clinical Impression Very unfortunate, debilitated gentleman here following further back surgery.  Of note, this is his fourth CIR stay and family remains very supportive.  Concerned about current level of assistance needed and goals being set up for moderate assistance wheelchair level.  Patient and wife hopeful he can do better than this to decrease caregiver burden.  Patient denies any significant emotional distress.  Social work to follow for support and discharge planning needs.  Shamari Lofquist 04/19/2018, 4:19 PM

## 2018-04-19 NOTE — Progress Notes (Signed)
[name], MD  Physician  Physical Medicine and Rehabilitation  Consult Note  Signed  Date of Service:  04/09/2018 8:19 AM       Related encounter: Admission (Discharged) from 04/08/2018 in [facility]      Signed      Expand All Collapse All       [] Hide copied text  [] Hover for details       Physical Medicine and Rehabilitation Consult   Reason for Consult: Lumbar and Cervical myelopathy with tetraplegia. Referring Physician: Dr. Ronnald Ramp.    HPI: Travis [name] is a 72 y.o. 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  History taken from chart review and patient. He started declining few months ago with paraparesis, inability to walk or perform ADLs due to severe recurrent lumbar stenosis L4/5 and thoracic spinal stenosis T1-T2. He was admitted on 04/08/18 for decompressive lumbar lami with arthrodesis L4/5 and partial C7 and full T1 laminectomy with medial facetectomy and foraminotomy by Dr. Ronnald Ramp. Post op is already showing some mprovement in BLE strength with ability to activate quads as well as PF/DF. Therapy evaluations to be done today. CIR recommended in anticipation of extensive rehab needs.  Patient was independent and walking household distances until Dec 2018. He started declining early this year with bladder incontinence and became wheelchair bound. He could perform stand pivot transfers with help. Able to feed self but needed assistance with all other tasks.  Son moved in with them to assist and he has an aide 3X a week to help with B/D. Family supportive and can assist after discharge.     Review of Systems  Constitutional: Positive for malaise/fatigue.  Respiratory: Positive for shortness of breath.   Cardiovascular: Positive for leg swelling.    Musculoskeletal: Positive for back pain.  All other systems reviewed and are negative.       Past Medical History:  Diagnosis Date  . Cervical myelopathy (HCC)    with myelomalacia at C5-6 and C3-4  . CHF (congestive heart failure) ([place])   . Constipation - functional    controlled with miralax  . COPD (chronic obstructive pulmonary disease) (HCC)    mild  . DJD (degenerative joint disease)    WHOLE SPINE  . Dyspnea   . Gait disorder   . Heart murmur    years ago   . Hypertension   . Hypothyroidism    Low d/t treatment of hyperthyroidism  . 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 Peripheral neuropathy   . Pneumonia   . PONV (postoperative nausea and vomiting)   . RBBB   . Renal disorder    acute kidney failure  . Restless legs syndrome 03/14/2015  . RLS (restless legs syndrome)   . Sleep apnea    DX  2009/2010 study done at [facility]  . 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 Type II diabetes mellitus ([place])    diet controlled         Past Surgical History:  Procedure Laterality Date  . BACK SURGERY     6 surgeries  . CARDIAC CATHETERIZATION [name] 09/07/2015   Procedure: Right/Left Heart Cath and Coronary Angiography;  Surgeon: Peter M Martinique, MD;  Location: [place];  Service: Cardiovascular;  Laterality: [name];  . CHOLECYSTECTOMY    .  EYE SURGERY     BIL CATARACTS  . LUMBAR LAMINECTOMY/DECOMPRESSION MICRODISCECTOMY  07/14/2012   Procedure: LUMBAR LAMINECTOMY/DECOMPRESSION MICRODISCECTOMY 2 LEVELS;  Surgeon: Eustace Moore, MD;  Location: [place] [facility];  Service: Neurosurgery;  Laterality: Left;  Lumbar two three laminectomy, left thoracic four five transpedicular diskectomy  . POSTERIOR LUMBAR FUSION  04/08/2018   Laminectomy and Foraminotomy - Lumbar Four-Lumbar Five, instrumented posterolateral fusion Lumbar Four- Five, Laminectomy and Foraminotomy - Thoracic One-Thoracic Two  . POSTERIOR LUMBAR FUSION  04/08/2018    Laminectomy and Foraminotomy - Lumbar Four-Lumbar Five, instrumented posterolateral fusion Lumbar Four- Five, Laminectomy and Foraminotomy - Thoracic One-Thoracic Two         Family History  Problem Relation Age of Onset  . Polycythemia Mother   . Diabetes Father   . Hypertension Father   . Heart disease Father   . CAD Unknown        1 of his 4 brothers and father has CAD    Social History:  Married. Was independent prior to December. Family has been assisting due to progressive decline. Daughter in Sports coach a Marine scientist at [employer]. He  reports that he quit smoking about 32 years ago. His smoking use included cigarettes. He has a 40.00 pack-year smoking history. He quit smokeless tobacco use about 32 years ago. He reports that he does not drink alcohol or use drugs.         Allergies  Allergen Reactions  . Other Other (See Comments)    ANESTHESIA UNSPECIFIED  -- makes sick, must have antinausea medication in advance.  . Levofloxacin In D5w Itching    Possible infusion reaction 03/29/17. Tolerating oral Levaquin 03/30/17          Medications Prior to Admission  Medication Sig Dispense Refill  . aspirin EC 81 MG EC tablet Take 1 tablet (81 mg total) by mouth daily. 30 tablet 0  . baclofen (LIORESAL) 10 MG tablet Take 1 tablet (10 mg total) by mouth 3 (three) times daily. For muscle spasms 90 each 1  . escitalopram (LEXAPRO) 10 MG tablet Take 1 tablet (10 mg total) by mouth daily. 30 tablet 2  . gabapentin (NEURONTIN) 400 MG capsule TAKE 1 CAPSULE (400 MG TOTAL) BY MOUTH EVERY 8 (EIGHT) HOURS. 90 capsule 0  . glimepiride (AMARYL) 1 MG tablet Take 1 tablet (1 mg total) by mouth daily with breakfast. 30 tablet 0  . hydrALAZINE (APRESOLINE) 50 MG tablet Take 1 tablet (50 mg total) by mouth every 8 (eight) hours. 90 tablet 0  . levothyroxine (SYNTHROID, LEVOTHROID) 175 MCG tablet Take 1 tablet (175 mcg total) by mouth daily. 30 tablet 2  . losartan (COZAAR) 50 MG tablet Take 50 mg by  mouth daily.    . polyethylene glycol (MIRALAX / GLYCOLAX) packet Take 17 g by mouth daily as needed. For constipation 14 each 0  . potassium chloride (KLOR-CON 10) 10 MEQ tablet Take 1 tablet (10 mEq total) by mouth 2 (two) times daily. 180 tablet 3  . pramipexole (MIRAPEX) 0.5 MG tablet Take 1 tablet (0.5 mg total) by mouth 2 (two) times daily. 60 tablet 0  . tamsulosin (FLOMAX) 0.4 MG CAPS capsule Take 1 capsule (0.4 mg total) by mouth daily. 30 capsule 0  . torsemide (DEMADEX) 20 MG tablet 1 tablet twice daily and 1 extra tablet daily as needed for swelling 270 tablet 3  . TOVIAZ 8 MG TB24 tablet Take 1 tablet (8 mg total) by mouth daily. 30 tablet 11  .  ciprofloxacin (CIPRO) 500 MG tablet 500 mg 2 (two) times daily. X 10 days  0    Home: Home Living Family/patient expects to be discharged to:: Private residence Living Arrangements: Spouse/significant other  Functional History: Functional Status:  Mobility:  ADL:  Cognition: Cognition Orientation Level: Oriented X4  Blood pressure (!) 108/58, pulse 76, temperature 99 F (37.2 C), temperature source Oral, resp. rate 18, height 6\' 1"  (1.854 m), weight (!) 137.9 kg (304 lb 0.2 oz), SpO2 93 %. Physical Exam  Nursing note and vitals reviewed. Constitutional: He appears well-developed. He appears lethargic. He is easily aroused. No distress.  Morbidly obese male, lying in bed with towels on forehead. Reporting malaise.   HENT:  Head: Normocephalic and atraumatic.  Mouth/Throat: Oropharynx is clear and moist.  Eyes: Pupils are equal, round, and reactive to light. Conjunctivae and EOM are normal.  Neck: Normal range of motion. Neck supple.  Cardiovascular: Normal rate and regular rhythm.  Respiratory: Effort normal. No stridor. No respiratory distress. He has decreased breath sounds in the right lower field and the left lower field. He has no wheezes. He has no rales.  +[place]  GI: Soft. Bowel sounds are normal. He exhibits  distension. There is no tenderness.  Musculoskeletal: He exhibits edema.  2+ pedal edema bilaterally with stasis changes. Foam dressing left shin.   Neurological: He is easily aroused. He appears lethargic.  Slurred speech.  Oriented X 2--thought he was here due to CHF.  Needed max cues to recall surgery.  He was able to follow simple one step motor commands without difficulty.  Sensory deficits BLE in stocking distribution. Motor: B/l UE 4-4+/5 with ?dysmetria B/l LE: HF 3-/5, KE 3+/5, ADF 4-/5  Skin: Skin is warm and dry. He is not diaphoretic.  See above  Psychiatric: His affect is blunt. His speech is delayed. He is slowed. Cognition and memory are impaired.             Assessment/Plan: Diagnosis: Cervical and Lumbar myelopathy s/p decompression/arthrodesis Labs and images independently reviewed.  Records reviewed and summated above  1. Does the need for close, 24 hr/day medical supervision in concert with the patient's rehab needs make it unreasonable for this patient to be served in a less intensive setting? Yes  2. 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 Due to bladder management, bowel management, safety, skin/wound care,  disease management, pain management and patient education, does the patient require 24 hr/day rehab nursing? Yes 4. Does the patient require coordinated care of a physician, rehab nurse, PT (1-2 hrs/day, 5 days/week) and OT (1-2 hrs/day, 5 days/week) to address physical and functional deficits in the context of the above medical diagnosis(es)? Yes Addressing deficits in the following areas: balance, endurance, locomotion, strength, transferring, bowel/bladder control, bathing, dressing, toileting and psychosocial support 5. Can the patient actively participate in an intensive therapy program of at least 3 hrs of therapy per  day at least 5 days per week? Potentially 6. The potential for patient to make measurable gains while on inpatient rehab is excellent 7. Anticipated functional outcomes upon discharge from inpatient rehab are min assist  with PT, min assist with OT, n/a with SLP. 8. Estimated rehab length of stay to reach the above functional goals is: 17-21 days. 9. Anticipated D/C setting: Home 10. Anticipated post D/C treatments: HH therapy and Home excercise program 11. Overall Rehab/Functional Prognosis: good  RECOMMENDATIONS: This patient's condition is appropriate for continued rehabilitative care in the following setting: CIR when able to tolerate 3 hours of therapy/day, hopefully in the next few days. Patient has agreed to participate in recommended program. Yes Note that insurance prior authorization may be required for reimbursement for recommended care.  Comment: Rehab Admissions Coordinator to follow up.  Delice Lesch, MD, ABPMR Bary Leriche, [name] 04/09/2018          Revision History                        Routing History

## 2018-04-19 NOTE — Progress Notes (Signed)
Patient received at 1830 via bed. Patient oriented to room and call bell system. Right NSL IV noted leaking. Antibiotic not infusing. Patient right BKA with stocking on no ortho brace on. Patient verbalized understanding of admission process. Patient complaining she will not eat carbohydrate modified food due to intolerance to artificial sweeteners. Continue with plan of care.  Mliss Sax

## 2018-04-19 NOTE — Progress Notes (Signed)
Orthopedic Tech Progress Note Patient Details:  Travis [name] Sep 26, 1946 [id number]  Ortho Devices Type of Ortho Device: Haematologist Ortho Device/Splint Location: (B) LE Ortho Device/Splint Interventions: Ordered, Application   Post Interventions Patient Tolerated: Well Instructions Provided: Care of device   Braulio Bosch 04/19/2018, 4:53 PM

## 2018-04-19 NOTE — IPOC Note (Signed)
Overall Plan of Care [facility]) Patient Details Name: Travis [name] MRN: [id number] DOB: 23-Feb-1946  Admitting Diagnosis: <principal problem not specified>myelopathy  Hospital Problems: Active Problems:   Myelopathy ([place])     Functional Problem List: Nursing Bladder, Edema, Endurance, Medication Management, Nutrition, Motor, Skin Integrity  PT Balance, Edema, Endurance, Motor, Sensory  OT Balance, Endurance, Motor, Pain  SLP    TR         Basic ADL's: OT Grooming, Bathing, Dressing, Toileting     Advanced  ADL's: OT       Transfers: PT Bed Mobility, Bed to Chair, Car, [employer], Floor  OT Toilet     Locomotion: PT Ambulation, Wheelchair Mobility     Additional Impairments: OT    SLP        TR      Anticipated Outcomes Item Anticipated Outcome  Self Feeding    Swallowing      Basic self-care  mod assist  Toileting  mod assist   Bathroom Transfers mod assist  Bowel/Bladder  regular pattern of emptying bladder, remain continent of bowel  Transfers  Mod Assist  Locomotion  Min Assist at w/c level  Communication     Cognition     Pain  less than 2  Safety/Judgment  Remain free of falls, skin breakdown and infection while on rehab   Therapy Plan: PT Intensity: Minimum of 1-2 x/day ,45 to 90 minutes PT Frequency: 5 out of 7 days PT Duration Estimated Length of Stay: 21-24 days OT Intensity: Minimum of 1-2 x/day, 45 to 90 minutes OT Frequency: 5 out of 7 days OT Duration/Estimated Length of Stay: 21-24 days      Team Interventions: Nursing Interventions Patient/Family Education, Bladder Management, Medication Management, Skin Care/Wound Management, Discharge Planning, Psychosocial Support  PT interventions Ambulation/gait training, Training and development officer, Community reintegration, Discharge planning, DME/adaptive equipment instruction, Functional mobility training, Neuromuscular re-education, Pain management, Patient/family education,  Psychosocial support, Therapeutic Activities, Therapeutic Exercise, UE/LE Strength taining/ROM, UE/LE Coordination activities, Wheelchair propulsion/positioning  OT Interventions Training and development officer, Discharge planning, DME/adaptive equipment instruction, Functional mobility training, Pain management, Patient/family education, Self Care/advanced ADL retraining, Therapeutic Activities, Therapeutic Exercise, UE/LE Strength taining/ROM, UE/LE Coordination activities  SLP Interventions    TR Interventions    SW/CM Interventions Discharge Planning, Psychosocial Support, Patient/Family Education   Barriers to Discharge MD  Medical stability  Nursing      PT Medical stability, New oxygen    OT      SLP      SW       Team Discharge Planning: Destination: PT-Home ,OT- Home , SLP-  Projected Follow-up: PT-Home health PT, OT-  Home health OT, SLP-  Projected Equipment Needs: PT-Other (comment)(pt already owns necessary equipment), OT- Wheelchair (measurements)(Pt needs wc), SLP-  Equipment Details: PT- , OT-  Patient/family involved in discharge planning: PT- Patient, Family member/caregiver,  OT-Family member/caregiver, Patient, SLP-   MD ELOS: 21-24 days Medical Rehab Prognosis:  Excellent Assessment: The patient has been admitted for CIR therapies with the diagnosis of myelopathy. The team will be addressing functional mobility, strength, stamina, balance, safety, adaptive techniques and equipment, self-care, bowel and bladder mgt, patient and caregiver education, NMR, discharge planning, therapeutic exercise, etc. Goals have been set at min to mod assist with basic self-care and mobility tasks.    Meredith Staggers, MD, FAAPMR      See Team Conference Notes for weekly updates to the plan of care

## 2018-04-19 NOTE — Progress Notes (Signed)
Travis Arn, MD  Physician  Physical Medicine and Rehabilitation  PMR Pre-admission  Addendum  Date of Service:  04/15/2018 12:00 PM       Related encounter: Admission (Discharged) from 04/08/2018 in [facility]            [] Hide copied text  [] Hover for details   PMR Admission Coordinator Pre-Admission Assessment  Patient: Travis [name] is an 72 y.o., male MRN: [id number] DOB: 07-04-1946 Height: 6\' 1"  (185.4 cm) Weight: 135.1 kg (297 lb 13.5 oz)                                                                                                                          Insurance Information HMO:      PPO: Yes     PCP:       IPA:       80/20:       OTHER:  Group # [id number] PRIMARY: BCBS medicare      Policy#: [id number]      Subscriber: Travis [name] Name: Travis [name]      Phone#: [phone]     Fax#: [phone] Pre-Cert#:                                       Employer: Retired Benefits:  Phone #: [phone]     Name:  On line Eff. Date: 12-29-17     Deduct: $0      Out of Pocket Max: $5900 [phone])      Life Max: [name] CIR: $310 days 1-6      SNF: $0 days 1-20; $172 days 21-60; $0 days 61-100 Outpatient:  Medical necessity     Co-Pay: $40/visit Home Health: 100%      Co-Pay: none DME: 80%     Co-Pay: 20% Providers: in network  Emergency Contact Information         Contact Information    Name Relation Home Work Mobile   [name],[name] [phone]  [phone]   [name],Travis Daughter   [phone]   [name], Travis   [phone]     Current Medical History  Patient Admitting Diagnosis: Cervical and Lumbar myelopathy s/p decompression/arthrodesis  History of Present Illness: A 71 y.o.malewith history of COPD, CKD, OSA, combined systolic CHF, 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  History taken from chart review and patient.He started declining few months ago with paraparesis, inability to walk or perform ADLs due to severe recurrent lumbar stenosis L4/5 and thoracic spinal stenosis T1-T2. He was admitted on 04/08/18 for decompressive lumbar lamiwith arthrodesis L4/5 and partial C7 and full T1 laminectomy with medial facetectomy and foraminotomy by Dr. Ronnald Ramp. Post op is already showing some mprovement in BLE strength with ability to activate quads as well as PF/DF. Therapy  evaluations to be done t4/15/19. CIR recommended in anticipation of extensive rehab needs.  Patientwasindependent and walking household distancesuntil Dec 2018. He started declining early this year with bladder incontinence and became wheelchair bound. He could perform stand pivot transfers with help. Able to feed self but needed assistance with all other tasks. Son moved in with them to assist and he has an aide 3X a week to help with B/D. Family supportive and can assist after discharge.   Past Medical History      Past Medical History:  Diagnosis Date  . Cervical myelopathy (HCC)    with myelomalacia at C5-6 and C3-4  . CHF (congestive heart failure) ([place])   . Constipation - functional    controlled with miralax  . COPD (chronic obstructive pulmonary disease) (HCC)    mild  . DJD (degenerative joint disease)    WHOLE SPINE  . Dyspnea   . Gait disorder   . Heart murmur    years ago   . Hypertension   . Hypothyroidism    Low d/t treatment of hyperthyroidism  . 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 Peripheral neuropathy   . Pneumonia   . PONV (postoperative nausea and vomiting)   . RBBB   . Renal disorder    acute kidney failure  . Restless legs syndrome 03/14/2015  . RLS (restless legs syndrome)   . Sleep apnea    DX  2009/2010 study done at [facility]  . 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 Type II diabetes mellitus (HCC)    diet controlled     Family History  family history includes CAD in his unknown relative; Diabetes in his father; Heart disease in his father; Hypertension in his father; Polycythemia in his mother.  Prior Rehab/Hospitalizations: Has been receiving [facility] PT, aide and RN 2-3 times a week for the past 6 weeks.  Has the patient had major surgery during 100 days prior to admission? No  Current Medications   Current Facility-Administered Medications:  .  acetaminophen (TYLENOL) tablet 650 mg, 650 mg, Oral, Q4H PRN, 650 mg at 04/15/18 1425 **OR** acetaminophen (TYLENOL) suppository 650 mg, 650 mg, Rectal, Q4H PRN, Eustace Moore, MD, 650 mg at 04/11/18 1732 .  escitalopram (LEXAPRO) tablet 10 mg, 10 mg, Oral, Daily, Eustace Moore, MD, 10 mg at 04/16/18 0930 .  fentaNYL (SUBLIMAZE) injection 12.5 mcg, 12.5 mcg, Intravenous, Q2H PRN, Magdalen Spatz, NP .  fesoterodine (TOVIAZ) tablet 8 mg, 8 mg, Oral, Daily, Eustace Moore, MD, 8 mg at 04/16/18 0930 .  gabapentin (NEURONTIN) capsule 400 mg, 400 mg, Oral, TID, Eustace Moore, MD, 400 mg at 04/16/18 0930 .  hydrALAZINE (APRESOLINE) tablet 25 mg, 25 mg, Oral, Q8H, Kroeger, Krista M., [name], 25 mg at 04/16/18 0636 .  insulin aspart (novoLOG) injection 0-15 Units, 0-15 Units, Subcutaneous, TID WC, Eustace Moore, MD, 2 Units at 04/16/18 [phone] .  levothyroxine (SYNTHROID, LEVOTHROID) tablet 175 mcg, 175 mcg, Oral, QAC breakfast, Eustace Moore, MD, 175 mcg at 04/16/18 0636 .  MEDLINE mouth rinse, 15 mL, Mouth Rinse, BID, Eustace Moore, MD, 15 mL at 04/16/18 0933 .  menthol-cetylpyridinium (CEPACOL) lozenge 3 mg, 1 lozenge, Oral, PRN **OR** phenol (CHLORASEPTIC) mouth spray 1 spray, 1 spray, Mouth/Throat, PRN, Eustace Moore, MD .  metoprolol tartrate (LOPRESSOR) tablet 25 mg, 25 mg, Oral, BID, Magdalen Spatz, NP, 25 mg at 04/16/18 0930 .  ondansetron (ZOFRAN) tablet 4 mg, 4 mg, Oral,  Q6H PRN **OR** ondansetron (ZOFRAN) injection 4 mg, 4 mg, Intravenous, Q6H PRN,  Eustace Moore, MD, 4 mg at 04/15/18 0005 .  polyethylene glycol (MIRALAX / GLYCOLAX) packet 17 g, 17 g, Oral, Daily, Kroeger, Krista M., [name], 17 g at 04/16/18 0930 .  pramipexole (MIRAPEX) tablet 0.5 mg, 0.5 mg, Oral, BID, Eustace Moore, MD, 0.5 mg at 04/16/18 0930 .  senna (SENOKOT) tablet 8.6 mg, 1 tablet, Oral, BID, Eustace Moore, MD, 8.6 mg at 04/16/18 0930 .  tamsulosin (FLOMAX) capsule 0.4 mg, 0.4 mg, Oral, Daily, Eustace Moore, MD, 0.4 mg at 04/16/18 0931 .  torsemide (DEMADEX) tablet 80 mg, 80 mg, Oral, BID, Hochrein, James, MD  Patients Current Diet: Precautions:  No bending, arching, or twisting Diet heart healthy/carb modified Room service appropriate? Yes; Fluid consistency: Thin; Fluid restriction: 1200 mL Fluid Diet - low sodium heart healthy  Precautions / Restrictions Precautions Precautions: Fall, Back, Cervical Precaution Comments: educated pt in back precautions, unable to state Restrictions Weight Bearing Restrictions: No   Has the patient had 2 or more falls or a fall with injury in the past year?No  Prior Activity Level Limited Community (1-2x/wk): Went out 3-4 times a week.  Wife does the driving.  Home Assistive Devices / Equipment Home Assistive Devices/Equipment: Wheelchair, Environmental consultant (specify type), CBG Meter, Eyeglasses, Cane (specify quad or straight), Dentures (specify type), Grab bars in shower, Hand-held shower hose, Shower chair with back Home Equipment: Environmental consultant - 2 wheels, Walker - 4 wheels, [place] - single point, Bedside commode, Wheelchair - manual, Shower seat - built in, [employer] - tub/shower  Prior Device Use: Indicate devices/aids used by the patient prior to current illness, exacerbation or injury? Manual wheelchair  Prior Functional Level Prior Function Level of Independence: Needs assistance Gait / Transfers Assistance Needed: Pt could transfer with assist but has not walked functionally since december ADL's / Homemaking Assistance  Needed: Pt can feed self and assist with some UE adls but has assist with all dressing and LE bathing, toileting.  Comments: uses WC and lift chair at home  Self Care: Did the patient need help bathing, dressing, using the toilet or eating?  Needed some help  Indoor Mobility: Did the patient need assistance with walking from room to room (with or without device)? Needed some help  Stairs: Did the patient need assistance with internal or external stairs (with or without device)? Dependent  Functional Cognition: Did the patient need help planning regular tasks such as shopping or remembering to take medications? Independent  Current Functional Level Cognition  Overall Cognitive Status: Impaired/Different from baseline Current Attention Level: Sustained Orientation Level: Oriented X4 Following Commands: Follows one step commands consistently General Comments: Functional cognition this session.     Extremity Assessment (includes Sensation/Coordination)  Upper Extremity Assessment: RUE deficits/detail, LUE deficits/detail RUE Deficits / Details: ROM WFL  Strength 3+/5 shoulders LUE Deficits / Details: ROM WFL  Strength 3+/5 shoulders  Lower Extremity Assessment: RLE deficits/detail, LLE deficits/detail RLE Deficits / Details: strength 2/5 hip, knee and ankle LLE Deficits / Details: strength 2/5 hip knee and ankle    ADLs  Overall ADL's : Needs assistance/impaired Eating/Feeding: Supervision/ safety, Sitting, Set up Eating/Feeding Details (indicate cue type and reason): pt fatigues quickly.  Needs lid on drink with straw Grooming: Brushing hair, Sitting, Set up Grooming Details (indicate cue type and reason): assist reaching overhead to comb hair Upper Body Bathing: Moderate assistance, Sitting Upper Body Bathing Details (indicate cue type and reason):  pt requires supported sitting due to poor balance. Lower Body Bathing: Total assistance, +2 for physical assistance, Sit  to/from stand, Cueing for safety, Cueing for compensatory techniques Lower Body Bathing Details (indicate cue type and reason): Pt unable to dress LE at this time due to fatigue, poor balance and inability to reach LEs. Pt may need adaptive equipment at some point. Upper Body Dressing : Minimal assistance, Sitting Lower Body Dressing: Total assistance, Bed level Toilet Transfer: +2 for physical assistance, Maximal assistance(with Stedy) Toilet Transfer Details (indicate cue type and reason): Using Stedy to simulate from bed to chair.  Toileting- Clothing Manipulation and Hygiene: Total assistance Toileting - Clothing Manipulation Details (indicate cue type and reason): pt with loss of bowel in standing, cleaned Functional mobility during ADLs: Maximal assistance, +2 for physical assistance(Stedy) General ADL Comments: pt remains highly motivated    Mobility  Overal bed mobility: Needs Assistance Bed Mobility: Supine to Sit Rolling: Mod assist, +2 for physical assistance Sidelying to sit: +2 for physical assistance, Max assist Supine to sit: +2 for physical assistance, Max assist, HOB elevated Sit to supine: (Modified long sitting to supine) General bed mobility comments: max assist for LEs to EOB, mod assist for trunk, max assist to position hips at EOB with bed pad    Transfers  Overall transfer level: Needs assistance Equipment used: (see below) Transfer via Lift Equipment: Marketing executive Transfers: Sit to/from Stand Sit to Stand: Total assist Stand pivot transfers: Max assist, +2 physical assistance General transfer comment: performed x 1 from elevated bed and elevated chair    Ambulation / Gait / Stairs / Wheelchair Mobility  Ambulation/Gait Ambulation/Gait assistance: Max assist, +2 physical assistance Ambulation Distance (Feet): 1 Feet Assistive device: Rolling walker (2 wheeled) Gait Pattern/deviations: Step-to pattern, Decreased step length - right, Decreased step length -  left, Decreased dorsiflexion - right, Decreased dorsiflexion - left, Decreased weight shift to right, Decreased weight shift to left General Gait Details: Pt took two very slow steps forward and backward needing Max cues for sequencing and Max A +2 to stay upright and maintain balance.   Gait velocity: reduced    Posture / Balance Dynamic Sitting Balance Sitting balance - Comments: uses B UE support to balance Balance Overall balance assessment: Needs assistance Sitting-balance support: Bilateral upper extremity supported, Feet supported Sitting balance-Leahy Scale: Poor Sitting balance - Comments: uses B UE support to balance Postural control: Posterior lean Standing balance support: Bilateral upper extremity supported, During functional activity Standing balance-Leahy Scale: Poor Standing balance comment: used sara plus, stood x 2--3 min then 2 min, pt was having BM, cleaned pt with total assist.     Special needs/care consideration BiPAP/CPAP Yes, CPAP CPM No Continuous Drip IV No Dialysis No       Life Vest No Oxygen Yes, on 02 at 3L  in hospital, but no 02 at home Special Bed No Trach Size No Wound Vac (area) No     Skin LE edema with leg wraps, post op cervical and lumbar surgical incisions                             Bowel mgmt: Last BM 04/15/18 Bladder mgmt: Condom catheter Diabetic mgmt Yes, on oral medications at home    Previous Home Environment Living Arrangements: Spouse/significant other Available Help at Discharge: Family, Available 24 hours/day, Personal care attendant Type of Home: House Home Layout: One level Home Access: Ramped entrance Bathroom Shower/Tub: Walk-in  shower Bathroom Toilet: Handicapped height Bathroom Accessibility: Yes How Accessible: Accessible via wheelchair [place]: Yes Type of Home Care Services: Homehealth aide, Home PT, [place] (if known): [facility]  Additional Comments: pt had caregivers 3x a week for  bathing.  pt's wife was able to assist with transfers and toileting. pt mostly w/c level  Discharge Living Setting Plans for Discharge Living Setting: Patient's home, House, Lives with (comment)(Lives with his wife and a 27 yo son.) Type of Home at Discharge: House Discharge Home Layout: One level Discharge Home Access: Ramped entrance Does the patient have any problems obtaining your medications?: No  Social/Family/Support Systems Patient Roles: Spouse, Parent(Has a wife, 2 sons and 1 daughter.) Contact Information: Cayetano Mikita - wife Anticipated Caregiver: Wife  Anticipated Caregiver's Contact Information: [name] - wife - see emergency contacts Ability/Limitations of Caregiver: Wife is retired and can assist.  Son works but can assist when not at work. Caregiver Availability: 24/7 Discharge Plan Discussed with Primary Caregiver: Yes Is Caregiver In Agreement with Plan?: Yes Does Caregiver/Family have Issues with Lodging/Transportation while Pt is in Rehab?: No  Goals/Additional Needs Patient/Family Goal for Rehab: PT/OT min assist goals Expected length of stay: 17-21 days Cultural Considerations: None Dietary Needs: Heart healthy, carb modified, thin liquids diet Equipment Needs: TBD Pt/Family Agrees to Admission and willing to participate: Yes Program Orientation Provided & Reviewed with Pt/Caregiver Including Roles  & Responsibilities: Yes  Decrease burden of Care through IP rehab admission: [name]  Possible need for SNF placement upon discharge: Potentially  Patient Condition: This patient's medical and functional status has changed since the consult dated: 04/09/18 in which the Rehabilitation Physician determined and documented that the patient's condition is appropriate for intensive rehabilitative care in an inpatient rehabilitation facility. See "History of Present Illness" (above) for medical update. Functional changes are:  Currently requiring max assist +2 to total  assist for mobility and ADLs. Patient's medical and functional status update has been discussed with the Rehabilitation physician and patient remains appropriate for inpatient rehabilitation. Will admit to inpatient rehab today.  Preadmission Screen Completed By:  Retta Diones, 04/16/2018 10:24 AM ______________________________________________________________________   Discussed status with Dr. Posey Pronto on 04/16/18 at 1023 and received telephone approval for admission today.  Admission Coordinator:  Retta Diones, time 1024/Date 04/16/18         Revision History

## 2018-04-19 NOTE — Progress Notes (Signed)
Subjective/Complaints: No new complaints today. Breathing is fine. Slept well last night  ROS: Patient denies fever, rash, sore throat, blurred vision, nausea, vomiting, diarrhea, cough, shortness of breath or chest pain, joint or back pain, headache, or mood change.    Objective: Vital Signs: Blood pressure (!) 139/54, pulse 66, temperature 98.6 F (37 C), temperature source Oral, resp. rate 16, height _0  (1.854 m), weight 131.5 kg (289 lb 14.5 oz), SpO2 93 %. No results found. Results for orders placed or performed during the hospital encounter of 04/16/18 (from the past 72 hour(s))  CBC WITH DIFFERENTIAL     Status: Abnormal   Collection Time: 04/16/18  3:55 PM  Result Value Ref Range   WBC 5.8 4.0 - 10.5 K/uL   RBC 3.55 (L) 4.22 - 5.81 MIL/uL   Hemoglobin 10.0 (L) 13.0 - 17.0 g/dL   HCT 34.1 (L) 39.0 - 52.0 %   MCV 96.1 78.0 - 100.0 fL   MCH 28.2 26.0 - 34.0 pg   MCHC 29.3 (L) 30.0 - 36.0 g/dL   RDW 15.7 (H) 11.5 - 15.5 %   Platelets 215 150 - 400 K/uL   Neutrophils Relative % 79 %   Neutro Abs 4.6 1.7 - 7.7 K/uL   Lymphocytes Relative 13 %   Lymphs Abs 0.7 0.7 - 4.0 K/uL   Monocytes Relative 6 %   Monocytes Absolute 0.3 0.1 - 1.0 K/uL   Eosinophils Relative 2 %   Eosinophils Absolute 0.1 0.0 - 0.7 K/uL   Basophils Relative 0 %   Basophils Absolute 0.0 0.0 - 0.1 K/uL    Comment: Performed at [facility], [address], [place], [place] [address]  Comprehensive metabolic panel     Status: Abnormal   Collection Time: 04/16/18  3:55 PM  Result Value Ref Range   Sodium 143 135 - 145 mmol/L   Potassium 4.5 3.5 - 5.1 mmol/L   Chloride 100 (L) 101 - 111 mmol/L   CO2 34 (H) 22 - 32 mmol/L   Glucose, Bld 214 (H) 65 - 99 mg/dL   BUN 43 (H) 6 - 20 mg/dL   Creatinine, Ser 1.39 (H) 0.61 - 1.24 mg/dL   Calcium 8.7 (L) 8.9 - 10.3 mg/dL   Total Protein 6.6 6.5 - 8.1 g/dL   Albumin 2.7 (L) 3.5 - 5.0 g/dL   AST 19 15 - 41 U/L   ALT 15 (L) 17 - 63 U/L   Alkaline  Phosphatase 66 38 - 126 U/L   Total Bilirubin 0.5 0.3 - 1.2 mg/dL   GFR calc non Af Amer 49 (L) >60 mL/min   GFR calc Af Amer 56 (L) >60 mL/min    Comment: (NOTE) The eGFR has been calculated using the CKD EPI equation. This calculation has not been validated in all clinical situations. eGFR's persistently <60 mL/min signify possible Chronic Kidney Disease.    Anion gap 9 5 - 15    Comment: Performed at [facility] [address] [place], [place] [address]  Glucose, capillary     Status: Abnormal   Collection Time: 04/16/18  5:12 PM  Result Value Ref Range   Glucose-Capillary 210 (H) 65 - 99 mg/dL  Glucose, capillary     Status: Abnormal   Collection Time: 04/16/18  9:26 PM  Result Value Ref Range   Glucose-Capillary 173 (H) 65 - 99 mg/dL   Comment 1 Notify RN   Glucose, capillary     Status: Abnormal  Collection Time: 04/17/18  6:28 AM  Result Value Ref Range   Glucose-Capillary 151 (H) 65 - 99 mg/dL   Comment 1 Notify RN   Glucose, capillary     Status: Abnormal   Collection Time: 04/17/18 11:29 AM  Result Value Ref Range   Glucose-Capillary 158 (H) 65 - 99 mg/dL  Glucose, capillary     Status: Abnormal   Collection Time: 04/17/18  4:26 PM  Result Value Ref Range   Glucose-Capillary 173 (H) 65 - 99 mg/dL  Glucose, capillary     Status: Abnormal   Collection Time: 04/17/18  9:14 PM  Result Value Ref Range   Glucose-Capillary 179 (H) 65 - 99 mg/dL  Glucose, capillary     Status: Abnormal   Collection Time: 04/18/18  7:23 AM  Result Value Ref Range   Glucose-Capillary 200 (H) 65 - 99 mg/dL  Glucose, capillary     Status: Abnormal   Collection Time: 04/18/18 11:39 AM  Result Value Ref Range   Glucose-Capillary 227 (H) 65 - 99 mg/dL  Glucose, capillary     Status: Abnormal   Collection Time: 04/18/18  4:40 PM  Result Value Ref Range   Glucose-Capillary 172 (H) 65 - 99 mg/dL   Comment 1 Notify RN   Glucose, capillary     Status: Abnormal   Collection Time:  04/18/18 10:03 PM  Result Value Ref Range   Glucose-Capillary 382 (H) 65 - 99 mg/dL  Glucose, capillary     Status: Abnormal   Collection Time: 04/19/18  6:39 AM  Result Value Ref Range   Glucose-Capillary 171 (H) 65 - 99 mg/dL  Glucose, capillary     Status: Abnormal   Collection Time: 04/19/18  8:12 AM  Result Value Ref Range   Glucose-Capillary 212 (H) 65 - 99 mg/dL   Comment 1 Notify RN      Constitutional: No distress . Vital signs reviewed. HEENT: EOMI, oral membranes moist Cardiovascular: RRR without murmur. No JVD    Respiratory: CTA Bilaterally without wheezes or rales. Normal effort. Oxygen via [place]   GI: BS +, non-tender, non-distended  Ext: unna dressings Skin:     incision with dried serosanguineous drainage posterior cervical Steri-Strips in place no swelling or erythema Neuro: Alert/Oriented and Abnormal Motor 4/5 BUE, 2/5 BLE Musc/Skel:  Other np pain with UE or LE ROM Gen NAD   Assessment/Plan: 1. 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 Physiatrist is providing close team supervision and 24 hour management of active medical problems listed below. Physiatrist and rehab team continue to assess barriers to discharge/monitor patient progress toward functional and medical goals. FIM: Function - Bathing Position: Sitting EOB Body parts bathed by patient: Right arm, Left arm, Chest, Abdomen, Front perineal area, Left upper leg, Right upper leg Body parts bathed by helper: Right lower leg, Left lower leg, Back, Buttocks Assist Level: Touching or steadying assistance(Pt > 75%)  Function- Upper Body Dressing/Undressing What is the patient wearing?: Pull over shirt/dress Pull over shirt/dress - Perfomed by patient: Thread/unthread right sleeve, Thread/unthread left sleeve, Put head through opening, Pull shirt over trunk Assist Level: Set up Function - Lower Body  Dressing/Undressing What is the patient wearing?: Pants Position: Bed Pants- Performed by helper: Thread/unthread right pants leg, Thread/unthread left pants leg, Pull pants up/down Assist for footwear: Dependant Assist for lower body dressing: Touching or steadying assistance (Pt > 75%)  Function - Toileting Toileting activity  did not occur: No continent bowel/bladder event  Function - Air cabin crew transfer activity did not occur: Safety/medical concerns Assist level to toilet: Touching or steadying assistance (Pt > 75%) Assist level from toilet: Touching or steadying assistance (Pt > 75%)  Function - Chair/bed transfer Chair/bed transfer method: Other Chair/bed transfer assist level: 2 helpers Chair/bed transfer assistive device: Mechanical lift Mechanical lift: Clarise Cruz Chair/bed transfer details: Verbal cues for sequencing, Verbal cues for technique, Verbal cues for precautions/safety, Verbal cues for safe use of DME/AE, Manual facilitation for weight shifting, Manual facilitation for placement, Manual facilitation for weight bearing  Function - Locomotion: Wheelchair Will patient use wheelchair at discharge?: Yes Type: Manual Max wheelchair distance: 30' Assist Level: Supervision or verbal cues Wheel 50 feet with 2 turns activity did not occur: Safety/medical concerns Wheel 150 feet activity did not occur: Safety/medical concerns Turns around,maneuvers to table,bed, and toilet,negotiates 3% grade,maneuvers on rugs and over doorsills: No Function - Locomotion: Ambulation Ambulation activity did not occur: Safety/medical concerns  Function - Comprehension Comprehension: Auditory Comprehension assist level: Follows basic conversation/direction with no assist  Function - Expression Expression: Verbal Expression assist level: Expresses basic needs/ideas: With extra time/assistive device  Function - Social Interaction Social Interaction assist level: Interacts  appropriately with others with medication or extra time (anti-anxiety, antidepressant).  Function - Problem Solving Problem solving assist level: Solves basic 90% of the time/requires cueing < 10% of the time  Function - Memory Memory assist level: Recognizes or recalls 90% of the time/requires cueing < 10% of the time Patient normally able to recall (first 3 days only): Current season, Staff names and faces, That he or she is in a hospital  Medical Problem List and Plan: 1.  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  DVT Prophylaxis/Anticoagulation: SCDs.   vascular study negative but unna boots limited study 3. Pain Management: Neurontin 400 mg 3 times daily 4. Mood: Lexapro 10 mg daily 5. Neuropsych: This patient is not fully capable of making decisions on his own behalf. 6. Skin/Wound Care: Pressure relief measures.  7. Fluids/Electrolytes/Nutrition: Strict I/O. Heart healthy diet. Monitor voiding function.  8. OSA with acute hypercarbic respiratory failure: Has to use BIPAP or CPAP at nights 9. Acute on chronic diastolic CHF:  Improved with lasix which has been discontinued and changed to Demadex and hydralazine dose adjusted. Monitor weights daily with strict I/O. .    -weights stable to decreased Filed Weights   04/17/18 0227 04/18/18 0723 04/19/18 0602  Weight: (!) 136.5 kg (300 lb 14.9 oz) 134.3 kg (296 lb) 131.5 kg (289 lb 14.5 oz)   10 Acute on chronic renal failure. Baseline BUN/SCr 29/1.3--> 57/2.3--> 67/2.74. May require further aggressive diuresis.  Continue Demadex 80 mg twice daily as well as hydralazine 50 mg 3 times daily   -recheck bmet tomorrow 11. Morbid obesity: BMI 40.46.  12. Hypoxic encephalopathy: Clearing limit sedating medications. Encourage IS. CPAP whenever asleep. 13.  Hypertension.  Lopressor 25 mg twice daily.  Monitor with increased mobility 14.  BPH.   Flomax 0.4 mg daily, Toviaz 8 mg daily.  Check PVR x3 15.  Diabetes mellitus with peripheral neuropathy.  Hemoglobin A1c 7.7.  SSI.  Check blood sugars before meals and at bedtime   -cbg's labile/poorly controlled at present   -on amaryl at home, resume. Titrate regimen as needed 16.  Fevers.  Blood cultures no growth.  Urine culture negative.  Completed course of  Omnicef 17.  Hypothyroidism.  Synthroid 18.  Constipation.  Laxative assistance, increased Senokot to 2 tablets twice a day, continue MiraLAX 1 packet/day   -sorbitol today   -dulcolax prn    LOS (Days) 3 A FACE TO FACE EVALUATION WAS PERFORMED  Meredith Staggers 04/19/2018, 9:09 AM

## 2018-04-19 NOTE — Plan of Care (Signed)
  Problem: SCI BLADDER ELIMINATION Goal: RH STG MANAGE BLADDER WITH ASSISTANCE Description STG Manage Bladder With Assistance. Mod  Outcome: Progressing   Problem: RH SKIN INTEGRITY Goal: RH STG SKIN FREE OF INFECTION/BREAKDOWN Description Remain free of breakdown with mod assist  Outcome: Progressing Goal: RH STG MAINTAIN SKIN INTEGRITY WITH ASSISTANCE Description STG Maintain Skin Integrity With Assistance. Mod  Outcome: Progressing   Problem: SCI BOWEL ELIMINATION Goal: RH STG MANAGE BOWEL WITH ASSISTANCE Description STG Manage Bowel with mod assistance.  Outcome: Progressing Goal: RH STG SCI MANAGE BOWEL WITH MEDICATION WITH ASSISTANCE Description STG SCI Manage bowel with medication with assistance. Outcome: Progressing   Problem: RH SAFETY Goal: RH STG ADHERE TO SAFETY PRECAUTIONS W/ASSISTANCE/DEVICE Description STG Adhere to Safety Precautions With mod Assistance/Device.  Outcome: Progressing   Problem: RH KNOWLEDGE DEFICIT SCI Goal: RH STG INCREASE KNOWLEDGE OF SELF CARE AFTER SCI Description Increase knowledge of self care after SCI with mod assist  Outcome: Progressing

## 2018-04-19 NOTE — Progress Notes (Signed)
Physical Therapy Note  Patient Details  Name: Travis [name] MRN: [id number] Date of Birth: 06-22-1946 Today's Date: 04/19/2018  1000-1030, 30 min individual tx Pain: none per pt  tx focused on strengthening and stretching in seated position.  Pt on 4L O2 via [place]; O2 sats stayed above 95% given frequent rest breaks due to DOE. LO adjusted by PT to sit lower on spine, over lumbar area.   Seated soleus muscles stretch bil with feet flat on floor.  10 x 1:  trunk flexion; bil heel raises, bil shoulder protraction, 10 x 2 bil toe raises; 5 x 2 R/L long arc quad knee extension with assistance PRN.  PT cut elastic of pt's non-slip socks (over Una boots), as they were constricting ankles.   Pt left resting in w/c with needs at hand.  See function navigator for current status.   Ramaj Frangos 04/19/2018, 7:54 AM

## 2018-04-19 NOTE — Progress Notes (Signed)
Occupational Therapy Session Note  Patient Details  Name: Travis [name] MRN: [id number] Date of Birth: 10-07-1946  Today's Date: 04/19/2018 OT Individual Time: 1107-1200 OT Individual Time Calculation (min): 53 min    Short Term Goals: Week 1:  OT Short Term Goal 1 (Week 1): Pt will maintain standing tolerance in stedy for20 minutes while performing grooming at sink OT Short Term Goal 2 (Week 1): Pt will perform diaphramitic breathing strategies with 1 questioning cue OT Short Term Goal 3 (Week 1): Pt will transfer from sit to stand from elevated surface with max assist to prepare for donning pants. OT Short Term Goal 4 (Week 1): Pt will verbalize 3/3 back precautions with no cues.    Skilled Therapeutic Interventions/Progress Updates:    Treatment session with focus on sit > stand and standing tolerance during self-care tasks.  Pt received upright in w/c reporting desire to complete bathing/grooming tasks.  Set up for bathing at sink side with pt able to complete grooming tasks and UB bathing/dressing with setup assist.  LB bathing/dressing completed with use of Sara lift for standing.  Therapist doffed pants and washed buttocks while pt maintained standing in [place] ~4 mins.  Pt completed remainder of LB bathing seated with setup.  Donned pants and stood, using Clarise Cruz, total assist from therapist.   Therapy Documentation Precautions:  Precautions Precautions: Fall, Back, Cervical Restrictions Weight Bearing Restrictions: No General:   Vital Signs: Therapy Vitals Temp: 98 F (36.7 C) Temp Source: Oral Pulse Rate: (!) 53 BP: 133/89 Patient Position (if appropriate): Sitting Oxygen Therapy SpO2: 93 % O2 Device: Nasal Cannula O2 Flow Rate (L/min): 4 L/min Pain:  Pt with no c/o pain  See Function Navigator for Current Functional Status.   Therapy/Group: Individual Therapy  [name] 04/19/2018, 2:26 PM

## 2018-04-19 NOTE — Progress Notes (Signed)
Physical Therapy Session Note  Patient Details  Name: Travis [name] MRN: [id number] Date of Birth: 1946/01/18  Today's Date: 04/19/2018 PT Individual Time: 0845-1000 and 1450-1530  PT Individual Time Calculation (min): 75 min and 40 min  Short Term Goals: Week 1:  PT Short Term Goal 1 (Week 1): Pt will perform supine to/from sit with mod assist x 1 PT Short Term Goal 2 (Week 1): Pt will peform least restrictive transfer bed to/from w/c with max assist x 1 PT Short Term Goal 3 (Week 1): Pt will propel manual w/c x 50 ft with max assist  Skilled Therapeutic Interventions/Progress Updates: Pt presented in bed agreeable to therapy. Performed supine to sit modA with cues for maintaining spinal precautions and use of features. Performed Clarise Cruz transfer to w/c. Pt propelled 45f, 341fx 1 ea with brief rest due to fatigue. Pt participated in seated LE therex including LAQ, hip flexion, HS pulls with L2 resistance band, hip add, to fatigue, ball kicks for endurance to fatigue. Pt required frequent therapeutic breaks due to fatigue, SpO2 remained >90% throughout activities. Transported pt to day room and participated in UBE 4 min total, 57m63mforward, 2 min backwards. Pt returned to room at end of session and remained in w/c with call bell within reach and needs met.   Tx2: Pt presented in w/c agreeable to therapy. Pt indicating finding it difficult to propel w/c as feels as if "slipping" frequently. PTA applied theraband to bilateral rims, pt propelled approx [id number] increased propulsion noted from previous session. Pt returned to room and participated in standing with use of SaraClarise Cruzmoving footplate). Once pt pulled to standing pt was able to independently adjust feet and wt shift with support of SaraClarise Cruzghtly decreased. NT entered room and pt requested change of shorts. Pt able to maintain standing and lift individual foot with minA to allow change of shorts. Pt stood for approx 8 min total. Pt returned  to sitting and left with NT present to assess needs.      Therapy Documentation Precautions:  Precautions Precautions: Fall, Back, Cervical Restrictions Weight Bearing Restrictions: No General:   Vital Signs: Therapy Vitals Temp: 98 F (36.7 C) Temp Source: Oral Pulse Rate: (!) 53 BP: 133/89 Patient Position (if appropriate): Sitting Oxygen Therapy SpO2: 93 % O2 Device: Nasal Cannula O2 Flow Rate (L/min): 4 L/min   See Function Navigator for Current Functional Status.   Therapy/Group: Individual Therapy  Adrielle Polakowski  Altheria Shadoan, PTA  04/19/2018, 4:17 PM

## 2018-04-19 NOTE — Progress Notes (Signed)
Patient information reviewed and entered into eRehab system by Mak Bonny, RN, CRRN, PPS Coordinator.  Information including medical coding and functional independence measure will be reviewed and updated through discharge.     Per nursing patient was given "Data Collection Information Summary for Patients in Inpatient Rehabilitation Facilities with attached "Privacy Act Statement-Health Care Records" upon admission.  

## 2018-04-19 NOTE — Progress Notes (Addendum)
Progress Note  Patient Name: Travis [name] Date of Encounter: 04/19/2018  Primary Cardiologist: Mertie Moores, MD   Subjective   Without complaints. Breathing has improved. Denies CP or palpitations.   Inpatient Medications    Scheduled Meds: . escitalopram  10 mg Oral Daily  . fesoterodine  8 mg Oral Daily  . gabapentin  400 mg Oral TID  . glimepiride  1 mg Oral Q breakfast  . hydrALAZINE  50 mg Oral Q8H  . insulin aspart  0-15 Units Subcutaneous TID WC  . levothyroxine  175 mcg Oral QAC breakfast  . metoprolol tartrate  25 mg Oral BID  . polyethylene glycol  17 g Oral Daily  . pramipexole  0.5 mg Oral BID  . senna  2 tablet Oral BID  . sorbitol  60 mL Oral NOW  . tamsulosin  0.4 mg Oral Daily  . torsemide  80 mg Oral BID   Continuous Infusions:  PRN Meds: acetaminophen **OR** acetaminophen, bisacodyl, menthol-cetylpyridinium **OR** phenol, ondansetron **OR** ondansetron (ZOFRAN) IV, ondansetron **OR** [DISCONTINUED] ondansetron (ZOFRAN) IV, sorbitol   Vital Signs    Vitals:   04/18/18 2247 04/19/18 0508 04/19/18 0602 04/19/18 0759  BP: (!) 153/64 (!) 141/59  (!) 139/54  Pulse: (!) 55 (!) 54  66  Resp:  16    Temp:  98.6 F (37 C)    TempSrc:  Oral    SpO2:  93%    Weight:   289 lb 14.5 oz (131.5 kg)   Height:        Intake/Output Summary (Last 24 hours) at 04/19/2018 1057 Last data filed at 04/19/2018 0820 Gross per 24 hour  Intake 840 ml  Output 3000 ml  Net -2160 ml   Filed Weights   04/17/18 0227 04/18/18 0723 04/19/18 0602  Weight: (!) 300 lb 14.9 oz (136.5 kg) 296 lb (134.3 kg) 289 lb 14.5 oz (131.5 kg)    Physical Exam   GEN: Sitting upright in wheelchair in no acute distress.   Neck: No JVD, no carotid bruits Cardiac: RRR, no murmurs, rubs, or gallops.  Respiratory: mildly decreased breath sounds at bases; otherwise CTAB GI: NABS, Soft, nontender, non-distended  MS: 2+ LE edema; No deformity. Neuro:  Nonfocal, moving all extremities  spontaneously Psych: Normal affect   Labs    Chemistry Recent Labs  Lab 04/15/18 1215 04/16/18 0301 04/16/18 1555  NA 144 143 143  K 4.5 4.4 4.5  CL 103 102 100*  CO2 33* 33* 34*  GLUCOSE 205* 145* 214*  BUN 43* 44* 43*  CREATININE 1.34* 1.38* 1.39*  CALCIUM 8.5* 8.3* 8.7*  PROT  --   --  6.6  ALBUMIN  --   --  2.7*  AST  --   --  19  ALT  --   --  15*  ALKPHOS  --   --  66  BILITOT  --   --  0.5  GFRNONAA 51* 49* 49*  GFRAA 59* 57* 56*  ANIONGAP 8 8 9      Hematology Recent Labs  Lab 04/15/18 0338 04/16/18 0301 04/16/18 1555  WBC 5.3 5.0 5.8  RBC 3.28* 3.17* 3.55*  HGB 9.4* 9.1* 10.0*  HCT 31.4* 30.6* 34.1*  MCV 95.7 96.5 96.1  MCH 28.7 28.7 28.2  MCHC 29.9* 29.7* 29.3*  RDW 16.0* 16.0* 15.7*  PLT 153 165 215    Cardiac EnzymesNo results for input(s): TROPONINI in the last 168 hours. No results for input(s): TROPIPOC in the last  168 hours.   BNPNo results for input(s): BNP, PROBNP in the last 168 hours.   DDimer No results for input(s): DDIMER in the last 168 hours.   Radiology    No results found.  Cardiac Studies   Echocardiogram 03/06/18: Study Conclusions  - Left ventricle: The cavity size was normal. Wall thickness was increased in a pattern of mild LVH. Systolic function was vigorous. The estimated ejection fraction was in the range of 65% to 70%. Wall motion was normal; there were no regional wall motion abnormalities. Doppler parameters are consistent with pseudonormal left ventricular relaxation (grade 2 diastolic dysfunction). The E/A ratio is >1.5. The E/e&' ratio is >15, suggesting elevated LV filling pressure. - Left atrium: The atrium was mildly dilated. - Inferior vena cava: The vessel was normal in size. The respirophasic diameter changes were in the normal range (>= 50%), consistent with normal central venous pressure.    Patient Profile     72 y.o. male with chronic diastolic heart failure, OSA,  hypertension, diabetes and mild pulmonary hypertension admitted 04/08/18 for back surgery and developed increased shortness of breath post operatively. Cardiology following for acute on chronic diastolic CHF.    Assessment & Plan    1. Acute on chronic diastolic CHF: transitioned to torsemide 04/16/18 and discharged to CIR. Continues to diurese well on torsemide 80mg  BID. UOP with net -2.4L in the past 24 hours and -7.4L this admission. Weight down 10lbs since 4/19. Continues to require O2 via [place] (baseline is room air).  - Continue po diuretics at current dose - Wean O2 as tolerated - Continue to monitor strict I&Os and daily weights - Recommend weekly BMET to monitor electrolytes and Cr.  2. HTN: BP mildly elevated but stable on po hydralazine 50mg  TID. Home ARB on hold given recent AKI. - If BMET stable this week, can consider restarting ARB  3. Chronic renal insufficiency: Cr 1.39 at discharge (baseline); Peaked at 2.72 during last admission.  - Check BMET this week to reassess given ongoing diuresis.   For questions or updates, please contact [place] Please consult [email or web address] for contact info under Cardiology/STEMI.      Signed, Abigail Butts, [name]   Patient seen and examined.  I agree with findings as noted by Teodoro Kil above   Pt says his breathing is much better than it has been On exam:   Pt in wheelchair.  Neck is full  LUngs are CTA  Mild rhonchi    Cardiac  RRR  No S3  Abd  Benign  Ext  Wrapped  1+ edema     I would continue diuretics   Will follow up with electrolytes    Dorris Carnes 04/19/2018, 10:57 AM   [phone]

## 2018-04-20 ENCOUNTER — Inpatient Hospital Stay (HOSPITAL_COMMUNITY): Payer: Medicare Other | Admitting: Physical Therapy

## 2018-04-20 ENCOUNTER — Inpatient Hospital Stay (HOSPITAL_COMMUNITY): Payer: Medicare Other | Admitting: Occupational Therapy

## 2018-04-20 DIAGNOSIS — E876 Hypokalemia: Secondary | ICD-10-CM

## 2018-04-20 DIAGNOSIS — G4733 Obstructive sleep apnea (adult) (pediatric): Secondary | ICD-10-CM

## 2018-04-20 DIAGNOSIS — I5033 Acute on chronic diastolic (congestive) heart failure: Secondary | ICD-10-CM

## 2018-04-20 DIAGNOSIS — E1142 Type 2 diabetes mellitus with diabetic polyneuropathy: Secondary | ICD-10-CM

## 2018-04-20 DIAGNOSIS — N183 Chronic kidney disease, stage 3 (moderate): Secondary | ICD-10-CM

## 2018-04-20 DIAGNOSIS — G931 Anoxic brain damage, not elsewhere classified: Secondary | ICD-10-CM

## 2018-04-20 DIAGNOSIS — K5901 Slow transit constipation: Secondary | ICD-10-CM

## 2018-04-20 LAB — BASIC METABOLIC PANEL
Anion gap: 8 (ref 5–15)
BUN: 35 mg/dL — AB (ref 6–20)
CO2: 37 mmol/L — ABNORMAL HIGH (ref 22–32)
Calcium: 8.3 mg/dL — ABNORMAL LOW (ref 8.9–10.3)
Chloride: 95 mmol/L — ABNORMAL LOW (ref 101–111)
Creatinine, Ser: 1.4 mg/dL — ABNORMAL HIGH (ref 0.61–1.24)
GFR calc Af Amer: 56 mL/min — ABNORMAL LOW (ref >60)
GFR, EST NON AFRICAN AMERICAN: 48 mL/min — AB (ref >60)
Glucose, Bld: 131 mg/dL — ABNORMAL HIGH (ref 65–99)
POTASSIUM: 3.4 mmol/L — AB (ref 3.5–5.1)
SODIUM: 140 mmol/L (ref 135–145)

## 2018-04-20 LAB — GLUCOSE, CAPILLARY
GLUCOSE-CAPILLARY: 124 mg/dL — AB (ref 65–99)
GLUCOSE-CAPILLARY: 176 mg/dL — AB (ref 65–99)
Glucose-Capillary: 178 mg/dL — ABNORMAL HIGH (ref 65–99)
Glucose-Capillary: 164 mg/dL — ABNORMAL HIGH (ref 65–99)

## 2018-04-20 MED ORDER — POTASSIUM CHLORIDE CRYS ER 20 MEQ PO TBCR
30.0000 meq | EXTENDED_RELEASE_TABLET | Freq: Two times a day (BID) | ORAL | Status: AC
  Administered 2018-04-20 (×2): 30 meq via ORAL
  Filled 2018-04-20 (×2): qty 1

## 2018-04-20 MED ORDER — POTASSIUM CHLORIDE CRYS ER 20 MEQ PO TBCR
20.0000 meq | EXTENDED_RELEASE_TABLET | Freq: Every day | ORAL | Status: DC
  Administered 2018-04-21 – 2018-04-22 (×2): 20 meq via ORAL
  Filled 2018-04-20 (×2): qty 1

## 2018-04-20 NOTE — Progress Notes (Signed)
Physical Therapy Session Note  Patient Details  Name: Travis [name] MRN: [id number] Date of Birth: 11/04/1946  Today's Date: 04/20/2018 PT Individual Time: 0847-0915 1300-1355 PT Individual Time Calculation (min): 28 min and 55min  Short Term Goals: Week 1:  PT Short Term Goal 1 (Week 1): Pt will perform supine to/from sit with mod assist x 1 PT Short Term Goal 2 (Week 1): Pt will peform least restrictive transfer bed to/from w/c with max assist x 1 PT Short Term Goal 3 (Week 1): Pt will propel manual w/c x 50 ft with max assist  Skilled Therapeutic Interventions/Progress Updates: Tx1:  Pt presented in bed agreeable to therapy. Pt agreeable to perform supine therex due to shortened session. Pt participated in ankle pumps, QS, GS, AA heel slides, hip abd/add x 15 bilaterally. Performed SLR and manually resisted leg press x 10 bilaterally. Pt required intermittent rests due to fatigue. Pt left in bed at end of session with call bell within reach and needs met.   Tx2: Pt presented in w/c agreeable to therapy. Per nsg pt now on  3L supplemental O2, SpO2 91% at rest, provided cues for PLB. Transported pt to rehab gym for energy conservation and participated in sit to stand at parallel bars x 3. Pt required maxA to pull up to standing position, however once in standing pt able to perform standing march x 5 bilaterally each round. Pt noted to desat to low 80s after each trial but was able to recover quickly with seated rest and cues for diaphragmatic breathing. Attempted sit to stand from bariatric Stedy with maxA x 1 and increased time. Pt performed sit to stand from heightened Stedy seat x 2 with maxA and increased time, SpO2 dropped to high 80s with activity with recovery to 90 within 15-20 seconds. Once returned to w/c pt propelled w/c 50ft, 75ft, 50ft with rests in between maintaining SpO2 at 90-91%. Pt returned to room and remained in w/c with call bell within reach and needs met.       Therapy  Documentation Precautions:  Precautions Precautions: Fall, Back, Cervical Restrictions Weight Bearing Restrictions: No General:   Vital Signs: Oxygen Therapy SpO2: 94 % O2 Device: Nasal Cannula O2 Flow Rate (L/min): 3 L/min Pain:        See Function Navigator for Current Functional Status.   Therapy/Group: Individual Therapy      , PTA  04/20/2018, 12:17 PM  

## 2018-04-20 NOTE — Progress Notes (Signed)
Subjective/Complaints: Pt seen lying in bed this AM.  He slept well overnight. He states he is doing well with therapies. Appreciate Cards recs.  ROS: Denies CP, SOB, N/V/D   Objective: Vital Signs: Blood pressure (!) 148/64, pulse 66, temperature 98.7 F (37.1 C), temperature source Oral, resp. rate 16, height _0  (1.854 m), weight 130.2 kg (287 lb 0.6 oz), SpO2 92 %. No results found. Results for orders placed or performed during the hospital encounter of 04/16/18 (from the past 72 hour(s))  Glucose, capillary     Status: Abnormal   Collection Time: 04/17/18 11:29 AM  Result Value Ref Range   Glucose-Capillary 158 (H) 65 - 99 mg/dL  Glucose, capillary     Status: Abnormal   Collection Time: 04/17/18  4:26 PM  Result Value Ref Range   Glucose-Capillary 173 (H) 65 - 99 mg/dL  Glucose, capillary     Status: Abnormal   Collection Time: 04/17/18  9:14 PM  Result Value Ref Range   Glucose-Capillary 179 (H) 65 - 99 mg/dL  Glucose, capillary     Status: Abnormal   Collection Time: 04/18/18  7:23 AM  Result Value Ref Range   Glucose-Capillary 200 (H) 65 - 99 mg/dL  Glucose, capillary     Status: Abnormal   Collection Time: 04/18/18 11:39 AM  Result Value Ref Range   Glucose-Capillary 227 (H) 65 - 99 mg/dL  Glucose, capillary     Status: Abnormal   Collection Time: 04/18/18  4:40 PM  Result Value Ref Range   Glucose-Capillary 172 (H) 65 - 99 mg/dL   Comment 1 Notify RN   Glucose, capillary     Status: Abnormal   Collection Time: 04/18/18 10:03 PM  Result Value Ref Range   Glucose-Capillary 382 (H) 65 - 99 mg/dL  Glucose, capillary     Status: Abnormal   Collection Time: 04/19/18  6:39 AM  Result Value Ref Range   Glucose-Capillary 171 (H) 65 - 99 mg/dL  Glucose, capillary     Status: Abnormal   Collection Time: 04/19/18  8:12 AM  Result Value Ref Range   Glucose-Capillary 212 (H) 65 - 99 mg/dL   Comment 1 Notify RN   Glucose, capillary     Status: Abnormal   Collection  Time: 04/19/18 11:24 AM  Result Value Ref Range   Glucose-Capillary 205 (H) 65 - 99 mg/dL  Glucose, capillary     Status: Abnormal   Collection Time: 04/19/18  4:47 PM  Result Value Ref Range   Glucose-Capillary 157 (H) 65 - 99 mg/dL  Glucose, capillary     Status: Abnormal   Collection Time: 04/19/18  8:59 PM  Result Value Ref Range   Glucose-Capillary 186 (H) 65 - 99 mg/dL  Basic metabolic panel     Status: Abnormal   Collection Time: 04/20/18  5:11 AM  Result Value Ref Range   Sodium 140 135 - 145 mmol/L   Potassium 3.4 (L) 3.5 - 5.1 mmol/L   Chloride 95 (L) 101 - 111 mmol/L   CO2 37 (H) 22 - 32 mmol/L   Glucose, Bld 131 (H) 65 - 99 mg/dL   BUN 35 (H) 6 - 20 mg/dL   Creatinine, Ser 1.40 (H) 0.61 - 1.24 mg/dL   Calcium 8.3 (L) 8.9 - 10.3 mg/dL   GFR calc non Af Amer 48 (L) >60 mL/min   GFR calc Af Amer 56 (L) >60 mL/min    Comment: (NOTE) The eGFR has been calculated using the CKD  EPI equation. This calculation has not been validated in all clinical situations. eGFR's persistently <60 mL/min signify possible Chronic Kidney Disease.    Anion gap 8 5 - 15    Comment: Performed at [facility] [address] [place], [place] [address]  Glucose, capillary     Status: Abnormal   Collection Time: 04/20/18  6:51 AM  Result Value Ref Range   Glucose-Capillary 124 (H) 65 - 99 mg/dL   Comment 1 Notify RN     Gen NAD. Vital signs reviewed. Constitutional: No distress . Vital signs reviewed. HENT: Normocephalic, atraumatic. Eyes: EOMI. No discharge.  Cardiovascular: RRR. No JVD    Respiratory: CTA Bilaterally. Normal effort. +Jeff GI: BS +, non-distended  Musc/Skel:  No edema. No tenderness. Neuro: Alert and Oriented  Motor: 4/5 BUE proximal to distal 2+/5 BLE proximal to distal Skin: Intact. Warm and dry.   Assessment/Plan: 1. 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 Physiatrist is providing close team supervision and 24 hour management of active medical problems listed below. Physiatrist and rehab team continue to assess barriers to discharge/monitor patient progress toward functional and medical goals. FIM: Function - Bathing Position: Wheelchair/chair at sink Body parts bathed by patient: Right arm, Left arm, Chest, Abdomen, Front perineal area, Right upper leg, Left upper leg Body parts bathed by helper: Buttocks, Right lower leg, Left lower leg Assist Level: (Mod assist)  Function- Upper Body Dressing/Undressing What is the patient wearing?: Pull over shirt/dress Pull over shirt/dress - Perfomed by patient: Thread/unthread right sleeve, Thread/unthread left sleeve, Put head through opening, Pull shirt over trunk Assist Level: Set up Function - Lower Body Dressing/Undressing What is the patient wearing?: Pants Position: Wheelchair/chair at sink Pants- Performed by helper: Thread/unthread right pants leg, Thread/unthread left pants leg, Pull pants up/down Assist for footwear: Dependant Assist for lower body dressing: (Total assist, sit <> stand in [place] lift)  Function - Toileting Toileting activity did not occur: No continent bowel/bladder event Toileting steps completed by helper: Adjust clothing prior to toileting, Performs perineal hygiene, Adjust clothing after toileting(per NT report)  Function - Air cabin crew transfer activity did not occur: Safety/medical concerns Assist level to toilet: Moderate assist (Pt 50 - 74%/lift or lower)(per NT report) Assist level from toilet: Touching or steadying assistance (Pt > 75%)  Function - Chair/bed transfer Chair/bed transfer method: Other Chair/bed transfer assist level: 2 helpers Chair/bed transfer assistive device: Mechanical lift Mechanical lift: Clarise Cruz Chair/bed transfer details: Verbal cues for sequencing, Verbal cues for technique, Verbal cues for precautions/safety, Verbal  cues for safe use of DME/AE, Manual facilitation for weight shifting, Manual facilitation for placement, Manual facilitation for weight bearing  Function - Locomotion: Wheelchair Will patient use wheelchair at discharge?: Yes Type: Manual Max wheelchair distance: 30' Assist Level: Supervision or verbal cues Wheel 50 feet with 2 turns activity did not occur: Safety/medical concerns Wheel 150 feet activity did not occur: Safety/medical concerns Turns around,maneuvers to table,bed, and toilet,negotiates 3% grade,maneuvers on rugs and over doorsills: No Function - Locomotion: Ambulation Ambulation activity did not occur: Safety/medical concerns  Function - Comprehension Comprehension: Auditory Comprehension assist level: Follows basic conversation/direction with no assist  Function - Expression Expression: Verbal Expression assist level: Expresses basic needs/ideas: With extra time/assistive device  Function - Social Interaction Social Interaction assist level: Interacts appropriately with others with medication or extra time (anti-anxiety, antidepressant).  Function - Problem Solving Problem solving assist  level: Solves basic 90% of the time/requires cueing < 10% of the time  Function - Memory Memory assist level: Recognizes or recalls 90% of the time/requires cueing < 10% of the time Patient normally able to recall (first 3 days only): Current season, Staff names and faces, That he or she is in a hospital  Medical Problem List and Plan: 1.  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  DVT Prophylaxis/Anticoagulation: SCDs.      Vascular study negative but unna boots limited study 3. Pain Management: Neurontin 400 mg 3 times daily 4. Mood: Lexapro 10 mg daily 5. Neuropsych: This patient is not fully capable of making decisions on his own behalf. 6. Skin/Wound Care: Pressure relief  measures.  7. Fluids/Electrolytes/Nutrition: Strict I/Os. Heart healthy diet. Monitor voiding function.  8. OSA with acute hypercarbic respiratory failure: Has to use BIPAP or CPAP at nights 9. Acute on chronic diastolic CHF:  Improved with lasix which has been discontinued and changed to Demadex and hydralazine dose adjusted. Monitor weights daily with strict I/Os.       Filed Weights   04/18/18 0723 04/19/18 0602 04/20/18 0521  Weight: 134.3 kg (296 lb) 131.5 kg (289 lb 14.5 oz) 130.2 kg (287 lb 0.6 oz)    Stable on 4/23 10 Acute on chronic renal failure.   May require further aggressive diuresis.    Continue Demadex 80 mg twice daily as well as hydralazine 50 mg 3 times daily   ?Restart ARB this week per Cards   Cr 1.40 on 4/23   Cont to monitor 11. Morbid obesity: BMI 40.46.  12. Hypoxic encephalopathy:   Improving   limit sedating medications. Encourage IS. CPAP whenever asleep. 13.  Hypertension.  Lopressor 25 mg twice daily.    Monitor with increased mobility  Improving on 4/23 14.  BPH.  Flomax 0.4 mg daily, Toviaz 8 mg daily.   15.  Diabetes mellitus with peripheral neuropathy.  Hemoglobin A1c 7.7.  SSI.  Check blood sugars before meals and at bedtime   On amaryl at home, resumed. Titrate regimen as needed   Improving on 4/23 16.  Fevers.  Blood cultures no growth.  Urine culture negative.  Completed course of Omnicef 17.  Hypothyroidism.  Synthroid 18.  Constipation.  Laxative assistance, increased Senokot to 2 tablets twice a day, continue MiraLAX 1 packet/day   Dulcolax prn 19. Hypokalemia  Supplemented x1 day on 4/23   Cont to monitor  LOS (Days) 4 A FACE TO FACE EVALUATION WAS PERFORMED  Nathin Saran Lorie Phenix 04/20/2018, 8:03 AM

## 2018-04-20 NOTE — Care Management (Signed)
Inpatient [facility] Individual Statement of Services  Patient Name:  Travis [name]  Date:  04/20/2018  Welcome to the [facility].  Our goal is to provide you with an individualized program based on your diagnosis and situation, designed to meet your specific needs.  With this comprehensive rehabilitation program, you will be expected to participate in at least 3 hours of rehabilitation therapies Monday-Friday, with modified therapy programming on the weekends.  Your rehabilitation program will include the following services:  Physical Therapy (PT), Occupational Therapy (OT), 24 hour per day rehabilitation nursing, Therapeutic Recreaction (TR), Neuropsychology, Case Management (Social Worker), Rehabilitation Medicine, Nutrition Services and Pharmacy Services  Weekly team conferences will be held on Wednesdays to discuss your progress.  Your Social Worker will talk with you frequently to get your input and to update you on team discussions.  Team conferences with you and your family in attendance may also be held.  Expected length of stay: 21-24 days    Overall anticipated outcome: moderate assistance at wheelchair level  Depending on your progress and recovery, your program may change. Your Social Worker will coordinate services and will keep you informed of any changes. Your Social Worker's name and contact numbers are listed  below.  The following services may also be recommended but are not provided by the [facility] will be made to provide these services after discharge if needed.  Arrangements include referral to agencies that provide these services.  Your insurance has been verified to be:  [employer] Your primary doctor is:  Technical brewer  Pertinent information will be shared with your doctor and your insurance  company.  Social Worker:  [facility] [place], [place] or [phone]   Information discussed with and copy given to patient by: Lennart Pall, 04/20/2018, 11:25 AM

## 2018-04-20 NOTE — Progress Notes (Signed)
Progress Note  Patient Name: Travis [name] Date of Encounter: 04/20/2018  Primary Cardiologist: Mertie Moores, MD   Subjective   Breathing is OK   No CP     Inpatient Medications    Scheduled Meds: . escitalopram  10 mg Oral Daily  . fesoterodine  8 mg Oral Daily  . gabapentin  400 mg Oral TID  . glimepiride  1 mg Oral Q breakfast  . hydrALAZINE  50 mg Oral Q8H  . insulin aspart  0-15 Units Subcutaneous TID WC  . levothyroxine  175 mcg Oral QAC breakfast  . metoprolol tartrate  25 mg Oral BID  . polyethylene glycol  17 g Oral Daily  . potassium chloride  30 mEq Oral BID  . pramipexole  0.5 mg Oral BID  . senna  2 tablet Oral BID  . tamsulosin  0.4 mg Oral Daily  . torsemide  80 mg Oral BID   Continuous Infusions:  PRN Meds: acetaminophen **OR** acetaminophen, bisacodyl, menthol-cetylpyridinium **OR** phenol, ondansetron **OR** ondansetron (ZOFRAN) IV, ondansetron **OR** [DISCONTINUED] ondansetron (ZOFRAN) IV, sorbitol   Vital Signs    Vitals:   04/20/18 0655 04/20/18 0811 04/20/18 1207 04/20/18 1434  BP: (!) 148/64 (!) 127/54  139/66  Pulse:  72  61  Resp:    18  Temp:    98.5 F (36.9 C)  TempSrc:    Oral  SpO2:  94% 94% 94%  Weight:      Height:        Intake/Output Summary (Last 24 hours) at 04/20/2018 1450 Last data filed at 04/20/2018 1300 Gross per 24 hour  Intake 850 ml  Output 1450 ml  Net -600 ml   Filed Weights   04/18/18 0723 04/19/18 0602 04/20/18 0521  Weight: 296 lb (134.3 kg) 289 lb 14.5 oz (131.5 kg) 287 lb 0.6 oz (130.2 kg)    Physical Exam   GEN: Sitting upright in wheelchair in no acute distress.   Neck: Neck full   Cardiac: RRR, no murmurs, rubs, or gallops.  Respiratory: Clear GI: NABS, Soft, nontender, non-distended  MS: 1+ LE edema  Feet wrapped  ; No deformity. Neuro:  Nonfocal, moving all extremities spontaneously Psych: Normal affect   Labs    Chemistry Recent Labs  Lab 04/16/18 0301 04/16/18 1555  04/20/18 0511  NA 143 143 140  K 4.4 4.5 3.4*  CL 102 100* 95*  CO2 33* 34* 37*  GLUCOSE 145* 214* 131*  BUN 44* 43* 35*  CREATININE 1.38* 1.39* 1.40*  CALCIUM 8.3* 8.7* 8.3*  PROT  --  6.6  --   ALBUMIN  --  2.7*  --   AST  --  19  --   ALT  --  15*  --   ALKPHOS  --  66  --   BILITOT  --  0.5  --   GFRNONAA 49* 49* 48*  GFRAA 57* 56* 56*  ANIONGAP 8 9 8      Hematology Recent Labs  Lab 04/15/18 0338 04/16/18 0301 04/16/18 1555  WBC 5.3 5.0 5.8  RBC 3.28* 3.17* 3.55*  HGB 9.4* 9.1* 10.0*  HCT 31.4* 30.6* 34.1*  MCV 95.7 96.5 96.1  MCH 28.7 28.7 28.2  MCHC 29.9* 29.7* 29.3*  RDW 16.0* 16.0* 15.7*  PLT 153 165 215    Cardiac EnzymesNo results for input(s): TROPONINI in the last 168 hours. No results for input(s): TROPIPOC in the last 168 hours.   BNPNo results for input(s): BNP, PROBNP in  the last 168 hours.   DDimer No results for input(s): DDIMER in the last 168 hours.   Radiology    No results found.  Cardiac Studies   Echocardiogram 03/06/18: Study Conclusions  - Left ventricle: The cavity size was normal. Wall thickness was increased in a pattern of mild LVH. Systolic function was vigorous. The estimated ejection fraction was in the range of 65% to 70%. Wall motion was normal; there were no regional wall motion abnormalities. Doppler parameters are consistent with pseudonormal left ventricular relaxation (grade 2 diastolic dysfunction). The E/A ratio is >1.5. The E/e&' ratio is >15, suggesting elevated LV filling pressure. - Left atrium: The atrium was mildly dilated. - Inferior vena cava: The vessel was normal in size. The respirophasic diameter changes were in the normal range (>= 50%), consistent with normal central venous pressure.    Patient Profile     72 y.o. male with chronic diastolic heart failure, OSA, hypertension, diabetes and mild pulmonary hypertension admitted 04/08/18 for back surgery and developed increased  shortness of breath post operatively. Cardiology following for acute on chronic diastolic CHF.    Assessment & Plan    1. Acute on chronic diastolic CHF:  Diuresing  Keep  torsemide 80mg  BID.  BMET Cr is stable   K is a litle low   Would add 20 meq daily  Check BMET next week -  2. HTN: BP is OK  Continue meds     3. Chronic renal insufficiency:  Cr stable  BMET next wk    For questions or updates, please contact [facility] Please consult [email or web address] for contact info under Cardiology/STEMI.      Signed, Dorris Carnes, MD   Patient seen and examined.  I agree with findings as noted by Teodoro Kil above   Pt says his breathing is much better than it has been On exam:   Pt in wheelchair.  Neck is full  LUngs are CTA  Mild rhonchi    Cardiac  RRR  No S3  Abd  Benign  Ext  Wrapped  1+ edema     I would continue diuretics   Will follow up with electrolytes    Dorris Carnes 04/20/2018, 2:50 PM   [phone]

## 2018-04-20 NOTE — Therapy (Signed)
[facility] [address] [place], [place], [address] Phone: [phone]   Fax:  [phone]  Patient Details  Name: Travis [name] MRN: [id number] Date of Birth: 09/04/1946 Referring Provider:  No ref. provider found  Encounter Date: 04/20/2018   PHYSICAL THERAPY DISCHARGE SUMMARY  Visits from Start of Care: 2  Current functional level related to goals / functional outcomes: PT Short Term Goals - 11/09/17 0958      PT SHORT TERM GOAL #1   Title  Pt will be independent with home exercise program to address lower extremity and core weakness and balance impairment. TARGET DATE FOR ALL STGS: 12/02/17    Status  New      PT SHORT TERM GOAL #2   Title  Perform BERG and write STG and LTG as indicated.    Status  New      PT SHORT TERM GOAL #3   Title  Pt will amb. 300' over even terrain with LRAD at MOD I level to improve functional mobility.     Status  New      PT SHORT TERM GOAL #4   Title  Pt will improve gait speed with LRAD to >/=1.48ft/sec. to decr. falls risk.      PT Long Term Goals - 11/09/17 0958      PT LONG TERM GOAL #1   Title  Trial amb. with cane and write goal if indicated. TARGET DATE FOR ALL LTGS: 12/30/17    Status  New      PT LONG TERM GOAL #2   Title  Pt will improve gait speed to >/=1.89ft/sec. with LRAD to decr. falls risk.     Status  New      PT LONG TERM GOAL #3   Title  Pt will amb. 600' over even and paved surfaces with LRAD, at MOD I Level, to improve functional mobility.     Status  New      PT LONG TERM GOAL #4   Title  Perform 6MWT as indicated and write goal.          Remaining deficits: Unknown, as pt did not return since last visit 2/2 change in medical status and likely required surgery.   Education / Equipment: POC and HEP.   Plan: Patient agrees to discharge.  Patient goals were not met. Patient is being discharged due to a change in medical status.  ?????         , L 04/20/2018, 9:44 AM  [facility] [address] [place], , [address] Phone: [phone]   Fax:  [phone]    , PT,DPT 04/20/18 9:44 AM Phone: [phone] Fax: [phone]   

## 2018-04-20 NOTE — Progress Notes (Signed)
Occupational Therapy Session Note  Patient Details  Name: Travis [name] MRN: [id number] Date of Birth: [birth year]  Today's Date: 04/20/2018 OT Individual Time: 1000-1100 and 1445-1528 OT Individual Time Calculation (min): 60 min and 43 min   Short Term Goals: Week 1:  OT Short Term Goal 1 (Week 1): Pt will maintain standing tolerance in stedy for20 minutes while performing grooming at sink OT Short Term Goal 2 (Week 1): Pt will perform diaphramitic breathing strategies with 1 questioning cue OT Short Term Goal 3 (Week 1): Pt will transfer from sit to stand from elevated surface with max assist to prepare for donning pants. OT Short Term Goal 4 (Week 1): Pt will verbalize 3/3 back precautions with no cues.    Skilled Therapeutic Interventions/Progress Updates:    1) Treatment session with focus on activity tolerance, breathing strategies, and standing tolerance.  Pt received upright in w/c declining bathing/dressing this session.  Pt propelled w/c 25' x2 with rest break in between for strengthening and endurance.  Engaged in sit > stand in Standing frame with focus on weight shift and standing tolerance.  Pt able to maintain standing 5 mins x3 with focus on obtaining full upright stance with lift sling slightly loosened.  Engaged in alternating steps 5x to challenge weight shift and endurance.  Pt required prolonged seated rest break between each stand.  O2 sats dropped to 84-86% with each stand despite being on 4L O2, sats would return to 90-91% with ~1 min pursed lip breathing.  2) Treatment session with focus on BLE strengthening and endurance.  Pt reports fatigue from previous therapy sessions and requesting to engage in seated activity.  Engaged in kicking ball alternating legs with focus on motor control and increased ability to lift legs (knee extension and hip flexion) as pt very motivated to walk.  Alternating toe taps on ball with focus on hip flexion and endurance.  Pt asking what he  can do between therapy sessions to progress BLE.  Provided pt with exercises he can do from seated position, pt able to return demonstration.  Therapy Documentation Precautions:  Precautions Precautions: Fall, Back, Cervical Restrictions Weight Bearing Restrictions: No General:   Vital Signs: Therapy Vitals Pulse Rate: 72 BP: (!) 127/54 Oxygen Therapy SpO2: 94 % O2 Device: Nasal Cannula O2 Flow Rate (L/min): 4 L/min Pain: Pain Assessment Pain Scale: 0-10 Pain Score: 0-No pain  See Function Navigator for Current Functional Status.   Therapy/Group: Individual Therapy  [name] 04/20/2018, 12:03 PM

## 2018-04-20 NOTE — Progress Notes (Signed)
RT placed Pt on home CPAP.  Sterile water added and 3L O2 bled in to CPAP machine.

## 2018-04-21 ENCOUNTER — Inpatient Hospital Stay (HOSPITAL_COMMUNITY): Payer: Medicare Other | Admitting: Physical Therapy

## 2018-04-21 ENCOUNTER — Inpatient Hospital Stay (HOSPITAL_COMMUNITY): Payer: Medicare Other | Admitting: Occupational Therapy

## 2018-04-21 DIAGNOSIS — N179 Acute kidney failure, unspecified: Secondary | ICD-10-CM

## 2018-04-21 LAB — GLUCOSE, CAPILLARY
GLUCOSE-CAPILLARY: 141 mg/dL — AB (ref 65–99)
Glucose-Capillary: 135 mg/dL — ABNORMAL HIGH (ref 65–99)
Glucose-Capillary: 175 mg/dL — ABNORMAL HIGH (ref 65–99)
Glucose-Capillary: 180 mg/dL — ABNORMAL HIGH (ref 65–99)

## 2018-04-21 MED ORDER — LEVOTHYROXINE SODIUM 75 MCG PO TABS
175.0000 ug | ORAL_TABLET | Freq: Once | ORAL | Status: AC
Start: 2018-04-21 — End: 2018-04-21
  Administered 2018-04-21: 175 ug via ORAL
  Filled 2018-04-21: qty 1

## 2018-04-21 NOTE — Progress Notes (Signed)
Physical Therapy Session Note  Patient Details  Name: Travis [name] MRN: [id number] Date of Birth: 1946/07/05  Today's Date: 04/21/2018  PT Individual Time:[phone]   70 min   Short Term Goals: Week 1:  PT Short Term Goal 1 (Week 1): Pt will perform supine to/from sit with mod assist x 1 PT Short Term Goal 2 (Week 1): Pt will peform least restrictive transfer bed to/from w/c with max assist x 1 PT Short Term Goal 3 (Week 1): Pt will propel manual w/c x 50 ft with max assist  Skilled Therapeutic Interventions/Progress Updates:   Pt received sitting in WC and agreeable to PT  PT instructed pt in WC mobility x 165f with 3 short rest breaks. Min cues form PT improve UE ROM to increase use of momentum and reduced overall energy expenditure.   Sit<>stand in [facility] stand in [facility]edy. [facility] from elevated height. Pr instructed in standing tolerance 30sec x 2 and 1 minute x 2. supervison assist once in standing with lateral weight shifting intermittently.   Seated BLE therex instructed by PT: LAQ x 10 BLE.  Calf raises x 15 BLE Reciprocal hip flexion x 6 BLE.  Hip abduction x 12 with manual resistance Hip adduciton x 10 with manual resistance.  Kinetron reciprocal stengthening/endurance trainig 4 x 45 sec with 1.5 min rest break between bouts.   Seated UE therex for middle and Low traps with arm shuttle 3 x 45 sec and prolonged rest break between bouts. .   Patient returned to room and [facility] and all needs met.           Therapy Documentation Precautions:  Precautions Precautions: Fall, Back, Cervical Restrictions Weight Bearing Restrictions: No Vital Signs: Therapy Vitals Temp: 98.7 F (37.1 C) Temp Source: Oral Pulse Rate: (!) 57 Resp: 17 BP: (!) 138/54 Patient Position (if appropriate): Lying Oxygen Therapy SpO2: 93 % O2 Device: Nasal Cannula O2 Flow Rate (L/min): 3  L/min FiO2 (%): 100 %   See Function Navigator for Current Functional Status.   Therapy/Group: Individual Therapy  ALorie Phenix4/24/2019, 7:36 AM

## 2018-04-21 NOTE — Patient Care Conference (Signed)
Inpatient RehabilitationTeam Conference and Plan of Care Update Date: 04/21/2018   Time: 11:40 AM    Patient Name: Travis [name]      Medical Record Number: [id number]  Date of Birth: [birth year] Sex: Male         Room/Bed: 4M01C/4M01C-01 Payor Info: Payor: [employer]UE [employer]UE [employer] MEDICARE / Plan: BCBS MEDICARE / Product Type: *No Product type* /    Admitting Diagnosis: cervical lumbar myelopathy  Admit Date/Time:  04/16/2018  3:04 PM Admission Comments: No comment available   Primary Diagnosis:  <principal problem not specified> Principal Problem: <principal problem not specified>  Patient Active Problem List   Diagnosis Date Noted  . Hypokalemia   . Type 2 diabetes mellitus with peripheral neuropathy (HCC)   . Acute on chronic diastolic (congestive) heart failure ([place])   . Myelopathy ([place]) 04/16/2018  . SOB (shortness of breath)   . Hypoxic encephalopathy ([place])   . Benign essential HTN   . Benign prostatic hyperplasia with urinary retention   . Diabetic peripheral neuropathy ([place])   . FUO (fever of unknown origin)   . Atelectasis   . Hypercarbia   . Acute on chronic respiratory failure with hypoxia and hypercapnia (HCC)   . Acute on chronic diastolic heart failure ([place]) 04/09/2018  . Chronic obstructive pulmonary disease ([place])   . DDD (degenerative disc disease), cervical   . Morbid obesity ([place])   . Post-operative pain   . S/P lumbar spinal fusion 04/08/2018  . 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 Myelopathy, spondylogenic, cervical 11/11/2017  . Excessive daytime sleepiness 11/11/2017  . 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 Slow transit constipation   . Diabetes mellitus type 2 in obese ([place])   . 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 Debility 10/08/2017  . Stage 3 chronic kidney disease ([place])   . Spinal stenosis of lumbar region   . OSA (obstructive sleep apnea)   . Acute blood loss anemia   . Fever 10/02/2017  . Urinary tract infection without  hematuria   . Chronic diastolic CHF (congestive heart failure) ([place])   . Elevated troponin I level   . Type II diabetes mellitus ([place]) 08/19/2015  . HCAP (healthcare-associated pneumonia) 08/19/2015  . Hyperthyroidism 08/19/2015  . Hypothyroid 08/19/2015  . AKI (acute kidney injury) ([place]) 08/19/2015  . Blood poisoning   . Restless legs syndrome 03/14/2015  . Shortness of breath 03/09/2015  . Sinus bradycardia 09/21/2014  . Thoracic spondylosis with myelopathy 09/13/2014  . Cervical arthritis with myelopathy 07/18/2014  . Constipation - functional   . CAP (community acquired pneumonia) 07/10/2014  . Sepsis ([place]) 07/10/2014  . HTN (hypertension) 07/10/2014  . OSA on CPAP 07/10/2014  . Acute lower UTI 07/10/2014  . Edema leg 07/10/2014  . Acute respiratory failure with hypoxia ([place]) 07/10/2014  . 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 Other vitamin B12 deficiency anemia 07/13/2013  . 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 Polyneuropathy in other diseases classified elsewhere ([place]) 02/17/2013  . Other general symptoms(780.99) 02/17/2013  . Abnormality of gait 02/17/2013  . Cervical spondylosis with myelopathy 02/17/2013  . Back ache 09/15/2012    Expected Discharge Date: Expected Discharge Date: 05/12/18  Team Members Present: Physician leading conference: Dr. Delice Lesch Social Worker Present: Lennart Pall, LCSW Nurse Present: Dorien Chihuahua, RN PT Present: Phylliss Bob, PTA;Leavy Cella, PT OT Present: [name], OT SLP Present: Weston Anna, SLP PPS Coordinator present : Daiva Nakayama, RN, CRRN     Current  Status/Progress Goal Weekly Team Focus  Medical   Decreased functional mobility/paraparesis secondary to cervical and lumbar myelopathy status post decompression arthrodesis  Improve mobility, endurance, CHF, BP, AKI, CBGs, hypokalemia, hypoxic encephalopathy  See above   Bowel/Bladder   Continent of Bladder/Bowel uses urinal, LBM 04/20/18,  PRN's ordered  Remain continent od bladder/Bowel  Assess QS toileting needs and monitor output, notify MD/PA prn   Swallow/Nutrition/ Hydration             ADL's   Setup/supervision UB Bathing/dressing, total assist with lift (Sara+) for LB Bathing/dressing or at bed level, stand pivot with Sara+ lift  mod assist bathing, LB dressing, toilet transfers and toileting  ADL retraining while adhering to back and cervical precautions, transfers, sit > stand, education on AE   Mobility   mod/maxA bed mobilty, maxA sit to stand at parallel bars and with [name], w/c mobilty supervision up to 63ft    Mod A transfers, Supervision with w/c mobility  standing tolerance, transfers, endurance   Communication             Safety/Cognition/ Behavioral Observations  Alert, oriented able to make his needs know to staff  No falls/injuries while in Rehab  Assess and educate patient and family on safety measures and parameters at all time   Pain   S/P surgical cervical procedure, denies need for pain meds since 04/16/18, has prn meds if needed  < 3  QS and PRN assessmenrt with documentation and rollow up assessment for outcomes   Skin   Surgical incision to spinal region (back), steri strips in place  no signs of infection to surgical incision area  QS and PRN assessment and f/u treatment with MD/PA as needed    Rehab Goals Patient on target to meet rehab goals: Yes *See Care Plan and progress notes for long and short-term goals.     Barriers to Discharge  Current Status/Progress Possible Resolutions Date Resolved   Physician    Medical stability;Weight;Other (comments)  hypoxic encephalopathy  See above  Therapies, Cards recs, follow labs, optimize DM/BP meds, supplement K+      Nursing                  PT                    OT                  SLP                SW                Discharge Planning/Teaching Needs:  Plan home with wife who can provide some assistance and some private duty  caregiver support.  TBD   Team Discussion:  Pt with long-standing, chronic health issues.   Had some incont with bladder and may benefit from timed toileting.  Still using a lift to stand vs max assist.  Cannot maintain standing very long;  O2 sats drop with standing.  Currently mod - max assist overall with mod assist level goals except supervision w/c mobility.  Concern that he may require more assistance than wife can provide.  Hopeful that longer [name] with allow upgrade in goals.  Revisions to Treatment Plan:  None    Continued Need for Acute Rehabilitation Level of Care: The patient requires daily medical management by a physician with specialized training in physical medicine and rehabilitation for the following conditions: Daily direction of  a multidisciplinary physical rehabilitation program to ensure safe treatment while eliciting the highest outcome that is of practical value to the patient.: Yes Daily medical management of patient stability for increased activity during participation in an intensive rehabilitation regime.: Yes Daily analysis of laboratory values and/or radiology reports with any subsequent need for medication adjustment of medical intervention for : Neurological problems;Diabetes problems;Cardiac problems;Blood pressure problems;Renal problems  Lamont Glasscock 04/21/2018, 2:58 PM

## 2018-04-21 NOTE — Progress Notes (Signed)
Subjective/Complaints: Patient seen sitting up in bed eating breakfast this morning.  He states he slept well overnight.  He states he feels stronger.  Cards recommending to continue diuretics.  ROS: Denies CP, SOB, N/V/D   Objective: Vital Signs: Blood pressure (!) 138/54, pulse (!) 57, temperature 98.7 F (37.1 C), temperature source Oral, resp. rate 17, height 6' 1"  (1.854 m), weight 131.6 kg (290 lb 2 oz), SpO2 93 %. No results found. Results for orders placed or performed during the hospital encounter of 04/16/18 (from the past 72 hour(s))  Glucose, capillary     Status: Abnormal   Collection Time: 04/18/18 11:39 AM  Result Value Ref Range   Glucose-Capillary 227 (H) 65 - 99 mg/dL  Glucose, capillary     Status: Abnormal   Collection Time: 04/18/18  4:40 PM  Result Value Ref Range   Glucose-Capillary 172 (H) 65 - 99 mg/dL   Comment 1 Notify RN   Glucose, capillary     Status: Abnormal   Collection Time: 04/18/18 10:03 PM  Result Value Ref Range   Glucose-Capillary 382 (H) 65 - 99 mg/dL  Glucose, capillary     Status: Abnormal   Collection Time: 04/19/18  6:39 AM  Result Value Ref Range   Glucose-Capillary 171 (H) 65 - 99 mg/dL  Glucose, capillary     Status: Abnormal   Collection Time: 04/19/18  8:12 AM  Result Value Ref Range   Glucose-Capillary 212 (H) 65 - 99 mg/dL   Comment 1 Notify RN   Glucose, capillary     Status: Abnormal   Collection Time: 04/19/18 11:24 AM  Result Value Ref Range   Glucose-Capillary 205 (H) 65 - 99 mg/dL  Glucose, capillary     Status: Abnormal   Collection Time: 04/19/18  4:47 PM  Result Value Ref Range   Glucose-Capillary 157 (H) 65 - 99 mg/dL  Glucose, capillary     Status: Abnormal   Collection Time: 04/19/18  8:59 PM  Result Value Ref Range   Glucose-Capillary 186 (H) 65 - 99 mg/dL  Basic metabolic panel     Status: Abnormal   Collection Time: 04/20/18  5:11 AM  Result Value Ref Range   Sodium 140 135 - 145 mmol/L   Potassium  3.4 (L) 3.5 - 5.1 mmol/L   Chloride 95 (L) 101 - 111 mmol/L   CO2 37 (H) 22 - 32 mmol/L   Glucose, Bld 131 (H) 65 - 99 mg/dL   BUN 35 (H) 6 - 20 mg/dL   Creatinine, Ser 1.40 (H) 0.61 - 1.24 mg/dL   Calcium 8.3 (L) 8.9 - 10.3 mg/dL   GFR calc non Af Amer 48 (L) >60 mL/min   GFR calc Af Amer 56 (L) >60 mL/min    Comment: (NOTE) The eGFR has been calculated using the CKD EPI equation. This calculation has not been validated in all clinical situations. eGFR's persistently <60 mL/min signify possible Chronic Kidney Disease.    Anion gap 8 5 - 15    Comment: Performed at [facility] [address], [place], [place] [address]  Glucose, capillary     Status: Abnormal   Collection Time: 04/20/18  6:51 AM  Result Value Ref Range   Glucose-Capillary 124 (H) 65 - 99 mg/dL   Comment 1 Notify RN   Glucose, capillary     Status: Abnormal   Collection Time: 04/20/18 11:50 AM  Result Value Ref Range   Glucose-Capillary 178 (H) 65 - 99 mg/dL  Glucose, capillary     Status: Abnormal   Collection Time: 04/20/18  4:54 PM  Result Value Ref Range   Glucose-Capillary 176 (H) 65 - 99 mg/dL  Glucose, capillary     Status: Abnormal   Collection Time: 04/20/18  9:29 PM  Result Value Ref Range   Glucose-Capillary 164 (H) 65 - 99 mg/dL  Glucose, capillary     Status: Abnormal   Collection Time: 04/21/18  7:10 AM  Result Value Ref Range   Glucose-Capillary 135 (H) 65 - 99 mg/dL    Gen NAD. Vital signs reviewed. Constitutional: No distress . Vital signs reviewed. HENT: Normocephalic, atraumatic. Eyes: EOMI. No discharge.  Cardiovascular: RRR. No JVD    Respiratory: CTA Bilaterally.  Normal effort. GI: BS +, non-distended  Musc/Skel:  No edema. No tenderness. Neuro: Alert and Oriented  Motor: 4/5 BUE proximal to distal 4-/5 BLE proximal to distal Skin: Intact. Warm and dry.   Assessment/Plan: 1. Functional deficits secondary to Paraparesis which require 3+ hours per day of  interdisciplinary therapy in a comprehensive inpatient rehab setting. Physiatrist is providing close team supervision and 24 hour management of active medical problems listed below. Physiatrist and rehab team continue to assess barriers to discharge/monitor patient progress toward functional and medical goals. FIM: Function - Bathing Position: Wheelchair/chair at sink Body parts bathed by patient: Right arm, Left arm, Chest, Abdomen, Front perineal area, Right upper leg, Left upper leg Body parts bathed by helper: Buttocks Bathing not applicable: Right lower leg, Left lower leg(Unna boots) Assist Level: (Mod assist)  Function- Upper Body Dressing/Undressing What is the patient wearing?: Pull over shirt/dress Pull over shirt/dress - Perfomed by patient: Thread/unthread right sleeve, Thread/unthread left sleeve, Put head through opening, Pull shirt over trunk Assist Level: Set up Function - Lower Body Dressing/Undressing What is the patient wearing?: Pants Position: Wheelchair/chair at sink Pants- Performed by helper: Thread/unthread right pants leg, Thread/unthread left pants leg, Pull pants up/down Assist for footwear: Dependant Assist for lower body dressing: (Total assist, sit <> stand in [place] lift)  Function - Toileting Toileting activity did not occur: No continent bowel/bladder event Toileting steps completed by helper: Adjust clothing prior to toileting, Performs perineal hygiene, Adjust clothing after toileting(per NT report)  Function - Air cabin crew transfer activity did not occur: Safety/medical concerns Assist level to toilet: Moderate assist (Pt 50 - 74%/lift or lower)(per NT report) Assist level from toilet: Touching or steadying assistance (Pt > 75%)  Function - Chair/bed transfer Chair/bed transfer method: Other Chair/bed transfer assist level: 2 helpers Chair/bed transfer assistive device: Mechanical lift Mechanical lift: Clarise Cruz Chair/bed transfer details:  Verbal cues for sequencing, Verbal cues for technique, Verbal cues for precautions/safety, Verbal cues for safe use of DME/AE, Manual facilitation for weight shifting, Manual facilitation for placement, Manual facilitation for weight bearing  Function - Locomotion: Wheelchair Will patient use wheelchair at discharge?: Yes Type: Manual Max wheelchair distance: 30' Assist Level: Supervision or verbal cues Wheel 50 feet with 2 turns activity did not occur: Safety/medical concerns Wheel 150 feet activity did not occur: Safety/medical concerns Turns around,maneuvers to table,bed, and toilet,negotiates 3% grade,maneuvers on rugs and over doorsills: No Function - Locomotion: Ambulation Ambulation activity did not occur: Safety/medical concerns  Function - Comprehension Comprehension: Auditory Comprehension assist level: Follows basic conversation/direction with no assist  Function - Expression Expression: Verbal Expression assist level: Expresses basic needs/ideas: With extra time/assistive device  Function - Social Interaction Social Interaction assist level: Interacts appropriately with others with medication or extra  time (anti-anxiety, antidepressant).  Function - Problem Solving Problem solving assist level: Solves basic 90% of the time/requires cueing < 10% of the time  Function - Memory Memory assist level: Recognizes or recalls 90% of the time/requires cueing < 10% of the time Patient normally able to recall (first 3 days only): Current season, Staff names and faces, That he or she is in a hospital  Medical Problem List and Plan: 1.  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  DVT Prophylaxis/Anticoagulation: SCDs.      Vascular study negative but unna boots limited study 3. Pain Management: Neurontin 400 mg 3 times daily 4. Mood: Lexapro 10 mg daily 5. Neuropsych: This patient is  not fully capable of making decisions on his own behalf. 6. Skin/Wound Care: Pressure relief measures.  7. Fluids/Electrolytes/Nutrition: Strict I/Os. Heart healthy diet. Monitor voiding function.  8. OSA with acute hypercarbic respiratory failure: Has to use BIPAP or CPAP at nights 9. Acute on chronic diastolic CHF:  Improved with lasix which has been discontinued and changed to Demadex and hydralazine dose adjusted. Monitor weights daily with strict I/Os.       Filed Weights   04/19/18 0602 04/20/18 0521 04/21/18 0434  Weight: 131.5 kg (289 lb 14.5 oz) 130.2 kg (287 lb 0.6 oz) 131.6 kg (290 lb 2 oz)    Stable on 4/24 10 Acute on chronic renal failure.   May require further aggressive diuresis.    Continue Demadex 80 mg twice daily as well as hydralazine 50 mg 3 times daily   ?Restart ARB this week per Cards   Cr 1.40 on 4/23   Cont to monitor 11. Morbid obesity: BMI 40.46.  12. Hypoxic encephalopathy:   Improving   limit sedating medications. Encourage IS. CPAP whenever asleep. 13.  Hypertension.  Lopressor 25 mg twice daily.    Monitor with increased mobility  Overall elevated on 4/24 14.  BPH.  Flomax 0.4 mg daily, Toviaz 8 mg daily.   15.  Diabetes mellitus with peripheral neuropathy.  Hemoglobin A1c 7.7.  SSI.  Check blood sugars before meals and at bedtime   On amaryl at home, resumed. Titrate regimen as needed   Elevated, but improving on 4/24, will consider adjustments in medications tomorrow 16.  Fevers.  Blood cultures no growth.  Urine culture negative.  Completed course of Omnicef 17.  Hypothyroidism.  Synthroid 18.  Constipation.  Laxative assistance, increased Senokot to 2 tablets twice a day, continue MiraLAX 1 packet/day   Dulcolax prn 19. Hypokalemia  Supplemented x1 day on 4/23   Cont to monitor  LOS (Days) 5 A FACE TO FACE EVALUATION WAS PERFORMED  Cristel Rail Lorie Phenix 04/21/2018, 8:12 AM

## 2018-04-21 NOTE — Progress Notes (Signed)
Occupational Therapy Session Note  Patient Details  Name: Travis [name] MRN: [id number] Date of Birth: 1946/03/22  Today's Date: 04/21/2018 OT Individual Time: 0815-0900 OT Individual Time Calculation (min): 45 min    Short Term Goals: Week 1:  OT Short Term Goal 1 (Week 1): Pt will maintain standing tolerance in stedy for20 minutes while performing grooming at sink OT Short Term Goal 2 (Week 1): Pt will perform diaphramitic breathing strategies with 1 questioning cue OT Short Term Goal 3 (Week 1): Pt will transfer from sit to stand from elevated surface with max assist to prepare for donning pants. OT Short Term Goal 4 (Week 1): Pt will verbalize 3/3 back precautions with no cues.    Skilled Therapeutic Interventions/Progress Updates:    Treatment session with focus on functional mobility and endurance.  Pt received in bed with RN finishing dispensing AM meds.  Engaged in LB bathing and dressing at bed level due to incontinence of urine.  Engaged in bed mobility with focus on adhering to back precautions during mobility, max assist for rolling Rt and Lt with assist to bend knees for rolling.  Total assist for hygiene and clothing at bed level.  Max assist sidelying to sitting at EOB.  Engaged in [place] bathing and dressing while seated EOB with focus on activity tolerance and endurance.  Utilized Sara+ for sit > stand and transfer to w/c due to time constraints.  Pt tolerated standing x4 mins in Sara+ with sling slightly loosened to challenge standing balance and endurance. Pt left seated at sink in w/c to complete grooming tasks.  Therapy Documentation Precautions:  Precautions Precautions: Fall, Back, Cervical Restrictions Weight Bearing Restrictions: No General:   Vital Signs: Therapy Vitals Pulse Rate: 64 BP: 137/63 Patient Position (if appropriate): Lying Pain:  Pt with no c/o pain  See Function Navigator for Current Functional Status.   Therapy/Group: Individual  Therapy  [name] 04/21/2018, 9:26 AM

## 2018-04-21 NOTE — Progress Notes (Signed)
Physical Therapy Session Note  Patient Details  Name: Travis [name] MRN: [id number] Date of Birth: [birth year]  Today's Date: 04/21/2018 PT Individual Time: 1400-1515 PT Individual Time Calculation (min): 75 min   Short Term Goals: Week 1:  PT Short Term Goal 1 (Week 1): Pt will perform supine to/from sit with mod assist x 1 PT Short Term Goal 2 (Week 1): Pt will peform least restrictive transfer bed to/from w/c with max assist x 1 PT Short Term Goal 3 (Week 1): Pt will propel manual w/c x 50 ft with max assist  Skilled Therapeutic Interventions/Progress Updates: Pt presented in w/c agreeable to therapy. Pt propelled approx 106f no rest then additional 521ffor endurance. Pt transported remaining distance to day room and participated in Cybex Kintron 50cm/sec from w/c 3 bouts 2 min each for endurance and LE strengthening. SpO2 checked after each bout 88-89% with quick recovery after cues for PLB on 3L supplemental O2. Pt then transported to rehab gym and participated in seated therex including LAQ, hip abd/add, ankle pumps, and hip flexion 2 x 10 bilaterally. Pt participated in IS x 10 avg [id number] also performed manual stretching to hamstring and heel cord 3 x 1 min each. Pt performed sit to/from stand with use of Stedy x 5 maxA x 1. While in standing performed TKE bilaterally x 6 during each bout of standing. SpO2 checked throughout session lowest desat 88%. Pt propelled back to room with x 1 brief rest and remained in w/c at end of session with call bell within reach and needs met.      Therapy Documentation Precautions:  Precautions Precautions: Fall, Back, Cervical Restrictions Weight Bearing Restrictions: No General:   Vital Signs: Therapy Vitals Temp: 97.7 F (36.5 C) Temp Source: Oral Pulse Rate: (!) 54 Resp: 16 BP: (!) 127/57 Patient Position (if appropriate): Sitting Oxygen Therapy SpO2: 95 % O2 Device: Nasal Cannula O2 Flow Rate (L/min): 3 L/min FiO2 (%): 100  % See Function Navigator for Current Functional Status.   Therapy/Group: Individual Therapy  Shanie Mauzy  Antwuan Eckley, PTA  04/21/2018, 4:12 PM

## 2018-04-21 NOTE — Progress Notes (Signed)
Social Work Patient ID: Travis [name], male   DOB: 01/15/1946, 72 y.o.   MRN: [id number]   Have reviewed team conference with pt and he is aware and agreeable with targeted d/c date of 5/15.  He felt this was longer than he expected.  Explained that team feels longer LOS needed to reach a functional level that his wife can provide.  Will plan to review with wife tomorrow when she is here.  Lela Gell, LCSW

## 2018-04-22 ENCOUNTER — Inpatient Hospital Stay (HOSPITAL_COMMUNITY): Payer: Medicare Other | Admitting: Occupational Therapy

## 2018-04-22 ENCOUNTER — Inpatient Hospital Stay (HOSPITAL_COMMUNITY): Payer: Medicare Other

## 2018-04-22 ENCOUNTER — Inpatient Hospital Stay (HOSPITAL_COMMUNITY): Payer: Medicare Other | Admitting: Physical Therapy

## 2018-04-22 ENCOUNTER — Encounter: Payer: Self-pay | Admitting: Cardiovascular Disease

## 2018-04-22 DIAGNOSIS — D62 Acute posthemorrhagic anemia: Secondary | ICD-10-CM

## 2018-04-22 LAB — GLUCOSE, CAPILLARY
GLUCOSE-CAPILLARY: 146 mg/dL — AB (ref 65–99)
Glucose-Capillary: 136 mg/dL — ABNORMAL HIGH (ref 65–99)
Glucose-Capillary: 154 mg/dL — ABNORMAL HIGH (ref 65–99)
Glucose-Capillary: 163 mg/dL — ABNORMAL HIGH (ref 65–99)

## 2018-04-22 MED ORDER — POTASSIUM CHLORIDE CRYS ER 20 MEQ PO TBCR
20.0000 meq | EXTENDED_RELEASE_TABLET | Freq: Two times a day (BID) | ORAL | Status: DC
  Administered 2018-04-22 – 2018-05-12 (×40): 20 meq via ORAL
  Filled 2018-04-22 (×40): qty 1

## 2018-04-22 MED ORDER — METOLAZONE 2.5 MG PO TABS
2.5000 mg | ORAL_TABLET | Freq: Once | ORAL | Status: AC
  Administered 2018-04-23: 2.5 mg via ORAL
  Filled 2018-04-22: qty 1

## 2018-04-22 NOTE — Progress Notes (Signed)
Physical Therapy Note  Patient Details  Name: Travis [name] MRN: [id number] Date of Birth: 1946-12-05 Today's Date: 04/22/2018  1400-1505, 65 min individual tx Pain: none per pt Wearing TLSO  Wife [name] observed and assisted when requested.   W/c propulsion using bil UEs x 150' with supervision, x 2.  neuromuscular re-education via forced use for alternating reciprocal movement x bil LEs x 20 cycles x 2 seated in w/c using Kinetron at 30 cm/sec. In L sidelying on firm mat, use of MaxiSlide fabric to reduce friction, for active assistive > active bil hip flex/extension, isolated hip extension with flexed knees, and bil knee flex/extension in neutral hip position.  Pt stated that he has slept on his back for years due to back/back surgeries, but was pleased he was able to tolerate side lying during this session.   Nearly level slide board transfer w/c> mat to R with mod assist after set -up, with mod cues for head/hips relationship. After exercise, when pt fatigued, transfer to L required max assist.   PT discussed activity tolerance with pt, and recommended that pt try to get back to bed or recliner during the therapy day, rather than staying up in w/c all day.  Pt left resting in w/c with alarm set and all needs within reach.  See function navigator for current status.  Jamira Barfuss 04/22/2018, 12:52 PM

## 2018-04-22 NOTE — Progress Notes (Signed)
Occupational Therapy Session Note  Patient Details  Name: JAMAL PAVON MRN: [id number] Date of Birth: March 11, 1946  Today's Date: 04/22/2018 OT Individual Time: 1105-1200 OT Individual Time Calculation (min): 55 min    Short Term Goals: Week 1:  OT Short Term Goal 1 (Week 1): Pt will maintain standing tolerance in stedy for20 minutes while performing grooming at sink OT Short Term Goal 2 (Week 1): Pt will perform diaphramitic breathing strategies with 1 questioning cue OT Short Term Goal 3 (Week 1): Pt will transfer from sit to stand from elevated surface with max assist to prepare for donning pants. OT Short Term Goal 4 (Week 1): Pt will verbalize 3/3 back precautions with no cues.    Skilled Therapeutic Interventions/Progress Updates:    Treatment session with focus on functional transfers, sit > stand, standing tolerance, and BLE strengthening.  Pt received upright in w/c declining bathing/dressing.  Pt propelled w/c 100' with increased activity tolerance and endurance.  Completed slide board transfer w/c > therapy mat with setup of slide board and min assist fading to mod with fatigue.  Engaged in sit > stand x4 in [place] with focus on anterior weight shift, max assist from mat table and mod-max from [place] seat.  Pt able to stand 2-4 mins with each stand, amount of time diminishing as session went on.  Engaged in alternating toe taps and kicks while in sitting with focus on hip flexion and knee extension as well as endurance.  O2 sats dropping to 88% on 3L when standing, returning to 92-93% with cues for breathing technique.  Slide board transfer back to w/c mod assist due to fatigue.  Pt left upright in w/c with daughter present.  Therapy Documentation Precautions:  Precautions Precautions: Fall, Back, Cervical Restrictions Weight Bearing Restrictions: No Pain: Pain Assessment Pain Score: 0-No pain  See Function Navigator for Current Functional Status.   Therapy/Group: Individual  Therapy  [name] 04/22/2018, 12:14 PM

## 2018-04-22 NOTE — Progress Notes (Signed)
Progress Note  Patient Name: Travis [name] Date of Encounter: 04/22/2018  Primary Cardiologist: Mertie Moores, MD   Subjective   Breathing is OK   ON 3L O2  Did not use before   No CP   Inpatient Medications    Scheduled Meds: . escitalopram  10 mg Oral Daily  . fesoterodine  8 mg Oral Daily  . gabapentin  400 mg Oral TID  . glimepiride  1 mg Oral Q breakfast  . hydrALAZINE  50 mg Oral Q8H  . insulin aspart  0-15 Units Subcutaneous TID WC  . levothyroxine  175 mcg Oral QAC breakfast  . metoprolol tartrate  25 mg Oral BID  . polyethylene glycol  17 g Oral Daily  . potassium chloride  20 mEq Oral Daily  . pramipexole  0.5 mg Oral BID  . senna  2 tablet Oral BID  . tamsulosin  0.4 mg Oral Daily  . torsemide  80 mg Oral BID   Continuous Infusions:  PRN Meds: acetaminophen **OR** acetaminophen, bisacodyl, menthol-cetylpyridinium **OR** phenol, ondansetron **OR** ondansetron (ZOFRAN) IV, ondansetron **OR** [DISCONTINUED] ondansetron (ZOFRAN) IV, sorbitol   Vital Signs    Vitals:   04/21/18 1401 04/21/18 2042 04/22/18 0015 04/22/18 0600  BP: (!) 127/57 (!) 141/63  134/62  Pulse: (!) 54  60 62  Resp: 16  16 18   Temp: 97.7 F (36.5 C)   98.1 F (36.7 C)  TempSrc: Oral   Oral  SpO2: 95% 95% 94% 94%  Weight:    291 lb 0.1 oz (132 kg)  Height:        Intake/Output Summary (Last 24 hours) at 04/22/2018 1650 Last data filed at 04/22/2018 1000 Gross per 24 hour  Intake 440 ml  Output 1300 ml  Net -860 ml   Filed Weights   04/20/18 0521 04/21/18 0434 04/22/18 0600  Weight: 287 lb 0.6 oz (130.2 kg) 290 lb 2 oz (131.6 kg) 291 lb 0.1 oz (132 kg)    Physical Exam   GEN: Sitting upright in wheelchair in no acute distress.   Neck: Neck full  Difficult to assess JVP plus in chair   Cardiac: RRR, no murmurs, rubs, or gallops.  Respiratory: Clear GI: NABS, Soft, nontender, non-distended  MS: 1+ LE edema  Feet wrapped  ; No deformity. Neuro:  Nonfocal, moving all  extremities spontaneously Psych: Normal affect   Labs    Chemistry Recent Labs  Lab 04/16/18 0301 04/16/18 1555 04/20/18 0511  NA 143 143 140  K 4.4 4.5 3.4*  CL 102 100* 95*  CO2 33* 34* 37*  GLUCOSE 145* 214* 131*  BUN 44* 43* 35*  CREATININE 1.38* 1.39* 1.40*  CALCIUM 8.3* 8.7* 8.3*  PROT  --  6.6  --   ALBUMIN  --  2.7*  --   AST  --  19  --   ALT  --  15*  --   ALKPHOS  --  66  --   BILITOT  --  0.5  --   GFRNONAA 49* 49* 48*  GFRAA 57* 56* 56*  ANIONGAP 8 9 8      Hematology Recent Labs  Lab 04/16/18 0301 04/16/18 1555  WBC 5.0 5.8  RBC 3.17* 3.55*  HGB 9.1* 10.0*  HCT 30.6* 34.1*  MCV 96.5 96.1  MCH 28.7 28.2  MCHC 29.7* 29.3*  RDW 16.0* 15.7*  PLT 165 215    Cardiac EnzymesNo results for input(s): TROPONINI in the last 168 hours. No results for  input(s): TROPIPOC in the last 168 hours.   BNPNo results for input(s): BNP, PROBNP in the last 168 hours.   DDimer No results for input(s): DDIMER in the last 168 hours.   Radiology    No results found.  Cardiac Studies   Echocardiogram 03/06/18: Study Conclusions  - Left ventricle: The cavity size was normal. Wall thickness was increased in a pattern of mild LVH. Systolic function was vigorous. The estimated ejection fraction was in the range of 65% to 70%. Wall motion was normal; there were no regional wall motion abnormalities. Doppler parameters are consistent with pseudonormal left ventricular relaxation (grade 2 diastolic dysfunction). The E/A ratio is >1.5. The E/e&' ratio is >15, suggesting elevated LV filling pressure. - Left atrium: The atrium was mildly dilated. - Inferior vena cava: The vessel was normal in size. The respirophasic diameter changes were in the normal range (>= 50%), consistent with normal central venous pressure.    Patient Profile     72 y.o. male with chronic diastolic heart failure, OSA, hypertension, diabetes and mild pulmonary hypertension  admitted 04/08/18 for back surgery and developed increased shortness of breath post operatively. Cardiology following for acute on chronic diastolic CHF.    Assessment & Plan    1. Acute on chronic diastolic CHF:    Volume is still up  Diuresed 9.3 L   Wt down Would try 1 x of [place] to follow response  Cont torsemide bid 80 mg   2. HTN:  OK  Follow    3. Chronic renal insufficiency:    BMET next wk    For questions or updates, please contact [facility] Please consult [email or web address] for contact info under Cardiology/STEMI.     Dorris Carnes 04/22/2018, 4:50 PM   [phone]

## 2018-04-22 NOTE — Progress Notes (Signed)
Subjective/Complaints: Patient seen lying in bed this morning.  He states he slept very well last night and is ll sleepy this morning. He has questions about his discharge date.  ROS: denies CP, SOB, N/V/D   Objective: Vital Signs: Blood pressure 134/62, pulse 62, temperature 98.1 F (36.7 C), temperature source Oral, resp. rate 18, height 6' 1"  (1.854 m), weight 132 kg (291 lb 0.1 oz), SpO2 94 %. No results found. Results for orders placed or performed during the hospital encounter of 04/16/18 (from the past 72 hour(s))  Glucose, capillary     Status: Abnormal   Collection Time: 04/19/18  8:12 AM  Result Value Ref Range   Glucose-Capillary 212 (H) 65 - 99 mg/dL   Comment 1 Notify RN   Glucose, capillary     Status: Abnormal   Collection Time: 04/19/18 11:24 AM  Result Value Ref Range   Glucose-Capillary 205 (H) 65 - 99 mg/dL  Glucose, capillary     Status: Abnormal   Collection Time: 04/19/18  4:47 PM  Result Value Ref Range   Glucose-Capillary 157 (H) 65 - 99 mg/dL  Glucose, capillary     Status: Abnormal   Collection Time: 04/19/18  8:59 PM  Result Value Ref Range   Glucose-Capillary 186 (H) 65 - 99 mg/dL  Basic metabolic panel     Status: Abnormal   Collection Time: 04/20/18  5:11 AM  Result Value Ref Range   Sodium 140 135 - 145 mmol/L   Potassium 3.4 (L) 3.5 - 5.1 mmol/L   Chloride 95 (L) 101 - 111 mmol/L   CO2 37 (H) 22 - 32 mmol/L   Glucose, Bld 131 (H) 65 - 99 mg/dL   BUN 35 (H) 6 - 20 mg/dL   Creatinine, Ser 1.40 (H) 0.61 - 1.24 mg/dL   Calcium 8.3 (L) 8.9 - 10.3 mg/dL   GFR calc non Af Amer 48 (L) >60 mL/min   GFR calc Af Amer 56 (L) >60 mL/min    Comment: (NOTE) The eGFR has been calculated using the CKD EPI equation. This calculation has not been validated in all clinical situations. eGFR's persistently <60 mL/min signify possible Chronic Kidney Disease.    Anion gap 8 5 - 15    Comment: Performed at [facility] [address] [place],  [place] [address]  Glucose, capillary     Status: Abnormal   Collection Time: 04/20/18  6:51 AM  Result Value Ref Range   Glucose-Capillary 124 (H) 65 - 99 mg/dL   Comment 1 Notify RN   Glucose, capillary     Status: Abnormal   Collection Time: 04/20/18 11:50 AM  Result Value Ref Range   Glucose-Capillary 178 (H) 65 - 99 mg/dL  Glucose, capillary     Status: Abnormal   Collection Time: 04/20/18  4:54 PM  Result Value Ref Range   Glucose-Capillary 176 (H) 65 - 99 mg/dL  Glucose, capillary     Status: Abnormal   Collection Time: 04/20/18  9:29 PM  Result Value Ref Range   Glucose-Capillary 164 (H) 65 - 99 mg/dL  Glucose, capillary     Status: Abnormal   Collection Time: 04/21/18  7:10 AM  Result Value Ref Range   Glucose-Capillary 135 (H) 65 - 99 mg/dL  Glucose, capillary     Status: Abnormal   Collection Time: 04/21/18 11:25 AM  Result Value Ref Range   Glucose-Capillary 175 (H) 65 - 99 mg/dL  Glucose, capillary     Status: Abnormal  Collection Time: 04/21/18  4:35 PM  Result Value Ref Range   Glucose-Capillary 180 (H) 65 - 99 mg/dL  Glucose, capillary     Status: Abnormal   Collection Time: 04/21/18  9:49 PM  Result Value Ref Range   Glucose-Capillary 141 (H) 65 - 99 mg/dL   Comment 1 Notify RN   Glucose, capillary     Status: Abnormal   Collection Time: 04/22/18  6:38 AM  Result Value Ref Range   Glucose-Capillary 136 (H) 65 - 99 mg/dL   Comment 1 Notify RN     Gen NAD. Vital signs reviewed. Constitutional: No distress . Vital signs reviewed. HENT: Normocephalic, atraumatic. Eyes: EOMI. No discharge.  Cardiovascular: RRR. No JVD    Respiratory: CTA Bilaterally.  Normal effort. GI: BS +, non-distended  Musc/Skel:  No edema. No tenderness. Neuro: Alert and Oriented  Motor: 5/5 BUE proximal to distal 4-/5 BLE proximal to distal Skin: Intact. Warm and dry.   Assessment/Plan: 1. 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 Physiatrist is providing close team supervision and 24 hour management of active medical problems listed below. Physiatrist and rehab team continue to assess barriers to discharge/monitor patient progress toward functional and medical goals. FIM: Function - Bathing Position: Bed(LB at bed level and UB seated EOB) Body parts bathed by patient: Right arm, Left arm, Chest, Abdomen, Front perineal area Body parts bathed by helper: Buttocks, Right upper leg, Left upper leg, Back Bathing not applicable: Left lower leg, Right lower leg(Unna boots) Assist Level: (Mod assist)  Function- Upper Body Dressing/Undressing What is the patient wearing?: Pull over shirt/dress Pull over shirt/dress - Perfomed by patient: Thread/unthread right sleeve, Thread/unthread left sleeve, Put head through opening, Pull shirt over trunk Assist Level: Set up Function - Lower Body Dressing/Undressing What is the patient wearing?: Pants Position: Bed Pants- Performed by helper: Thread/unthread right pants leg, Thread/unthread left pants leg, Pull pants up/down Assist for footwear: Dependant Assist for lower body dressing: (Total assist)  Function - Toileting Toileting activity did not occur: No continent bowel/bladder event Toileting steps completed by helper: Adjust clothing prior to toileting, Performs perineal hygiene, Adjust clothing after toileting Toileting Assistive Devices: Other (comment)(BSC) Assist level: Two helpers  Function - Air cabin crew transfer activity did not occur: Safety/medical concerns Assist level to toilet: Moderate assist (Pt 50 - 74%/lift or lower)(per NT report) Assist level from toilet: Touching or steadying assistance (Pt > 75%)  Function - Chair/bed transfer Chair/bed transfer method: Other Chair/bed transfer assist level: dependent (Pt equals 0%) Chair/bed transfer assistive device: Mechanical lift Mechanical lift:  Clarise Cruz Chair/bed transfer details: Verbal cues for sequencing, Verbal cues for technique, Verbal cues for precautions/safety, Verbal cues for safe use of DME/AE, Manual facilitation for weight shifting, Manual facilitation for placement, Manual facilitation for weight bearing  Function - Locomotion: Wheelchair Will patient use wheelchair at discharge?: Yes Type: Manual Max wheelchair distance: 159f Assist Level: Supervision or verbal cues Wheel 50 feet with 2 turns activity did not occur: Safety/medical concerns Assist Level: Supervision or verbal cues Wheel 150 feet activity did not occur: Safety/medical concerns Assist Level: Supervision or verbal cues Turns around,maneuvers to table,bed, and toilet,negotiates 3% grade,maneuvers on rugs and over doorsills: No Function - Locomotion: Ambulation Ambulation activity did not occur: Safety/medical concerns  Function - Comprehension Comprehension: Auditory Comprehension assist level: Understands complex 90% of the time/cues 10% of the time  Function - Expression Expression: Verbal  Expression assist level: Expresses complex 90% of the time/cues < 10% of the time  Function - Social Interaction Social Interaction assist level: Interacts appropriately 90% of the time - Needs monitoring or encouragement for participation or interaction.  Function - Problem Solving Problem solving assist level: Solves complex 90% of the time/cues < 10% of the time  Function - Memory Memory assist level: Recognizes or recalls 90% of the time/requires cueing < 10% of the time Patient normally able to recall (first 3 days only): Current season, Staff names and faces, That he or she is in a hospital  Medical Problem List and Plan: 1.  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  DVT Prophylaxis/Anticoagulation: SCDs.      Vascular study negative but unna boots limited  study 3. Pain Management: Neurontin 400 mg 3 times daily 4. Mood: Lexapro 10 mg daily 5. Neuropsych: This patient is not fully capable of making decisions on his own behalf. 6. Skin/Wound Care: Pressure relief measures.  7. Fluids/Electrolytes/Nutrition: Strict I/Os. Heart healthy diet. Monitor voiding function.  8. OSA with acute hypercarbic respiratory failure: Has to use BIPAP or CPAP at nights 9. Acute on chronic diastolic CHF:  Improved with lasix which has been discontinued and changed to Demadex and hydralazine dose adjusted. Monitor weights daily with strict I/Os.       Filed Weights   04/20/18 0521 04/21/18 0434 04/22/18 0600  Weight: 130.2 kg (287 lb 0.6 oz) 131.6 kg (290 lb 2 oz) 132 kg (291 lb 0.1 oz)    Stable on 4/25 10 Acute on chronic renal failure.   May require further aggressive diuresis.    Continue Demadex 80 mg twice daily as well as hydralazine 50 mg 3 times daily   ?Restart ARB this week per Cards   Cr 1.40 on 4/23  Labs ordered for tomorrow   Cont to monitor 11. Morbid obesity: BMI 40.46.  12. Hypoxic encephalopathy:   Resolved    limit sedating medications. Encourage IS. CPAP whenever asleep. 13.  Hypertension.  Lopressor 25 mg twice daily.    Monitor with increased mobility  Overall controlled on 4/25 14.  BPH.  Flomax 0.4 mg daily, Toviaz 8 mg daily.   15.  Diabetes mellitus with peripheral neuropathy.  Hemoglobin A1c 7.7.  SSI.  Check blood sugars before meals and at bedtime   On amaryl at home, resumed. Titrate regimen as needed   Slightly elevated, but improving on 4/25 16.  Fevers.  Resolved. Blood cultures no growth.  Urine culture negative. Completed course of Omnicef 17.  Hypothyroidism.  Synthroid 18.  Constipation.  Laxative assistance, increased Senokot to 2 tablets twice a day, continue MiraLAX 1 packet/day   Dulcolax prn 19. Hypokalemia  Potassium 3.4 on 4/23  Supplemented x1 day on 4/23  Labs ordered for tomorrow   Cont to monitor 20.  Acute blood loss anemia  Hemoglobin 10.0 on 4/19  Labs ordered for tomorrow  Continue to monitor  LOS (Days) 6 A FACE TO FACE EVALUATION WAS PERFORMED  Benjermin Korber Lorie Phenix 04/22/2018, 8:08 AM

## 2018-04-22 NOTE — Progress Notes (Signed)
Physical Therapy Session Note  Patient Details  Name: Travis [name] MRN: [id number] Date of Birth: 1946/06/23  Today's Date: 04/22/2018 PT Individual Time: 0805-0915 PT Individual Time Calculation (min): 70 min   Short Term Goals: Week 1:  PT Short Term Goal 1 (Week 1): Pt will perform supine to/from sit with mod assist x 1 PT Short Term Goal 2 (Week 1): Pt will peform least restrictive transfer bed to/from w/c with max assist x 1 PT Short Term Goal 3 (Week 1): Pt will propel manual w/c x 50 ft with max assist  Skilled Therapeutic Interventions/Progress Updates:   Pt received supine in bed and agreeable to PT. Supine>sit transfer with mod assist to control BLE and maintain back precautions.  Sitting balance EOB to finish breakfast with min-supervision assist from PT and 1 UE support.   Pt noted to have been incontinent. Sit<>stand in stedy x2 with mod-max assist to PT to don/doff lower body clothing. Stedy transfers to [facility]. Pt performed upper body dressing with supervision assist.   WC mobility x186f x 2 with supervision assist from PT for safety; pt required 1 rest break for each bout of WC mobility.   SB transfer to mat table with mod assist and moderate cues for technique and safety.   Sit <>stand from elevated height in stedy x 3 with mod assist fading to max assist with fatigue. standing tolerance 45 sec-1 min x 3 with supervision assist and cues for pursed lip breathing. .   [facility] and all needs met.         Therapy Documentation Precautions:  Precautions Precautions: Fall, Back, Cervical Restrictions Weight Bearing Restrictions: No Vital Signs: Therapy Vitals Temp: 98.1 F (36.7 C) Temp Source: Oral Pulse Rate: 62 Resp: 18 BP: 134/62 Patient Position (if appropriate): Lying Oxygen Therapy SpO2: 94 % O2 Device: CPAP O2 Flow Rate (L/min): 2 L/min Pain: Pain Assessment Pain Scale: 0-10 Pain  Score: 0-No pain   See Function Navigator for Current Functional Status.   Therapy/Group: Individual Therapy  ALorie Phenix4/25/2019, 9:17 AM

## 2018-04-23 ENCOUNTER — Inpatient Hospital Stay (HOSPITAL_COMMUNITY): Payer: Medicare Other

## 2018-04-23 ENCOUNTER — Inpatient Hospital Stay (HOSPITAL_COMMUNITY): Payer: Medicare Other | Admitting: Occupational Therapy

## 2018-04-23 ENCOUNTER — Encounter: Payer: Self-pay | Admitting: Cardiovascular Disease

## 2018-04-23 ENCOUNTER — Inpatient Hospital Stay (HOSPITAL_COMMUNITY): Payer: Medicare Other | Admitting: Physical Therapy

## 2018-04-23 LAB — BASIC METABOLIC PANEL
ANION GAP: 9 (ref 5–15)
BUN: 34 mg/dL — ABNORMAL HIGH (ref 6–20)
CALCIUM: 8.6 mg/dL — AB (ref 8.9–10.3)
CO2: 36 mmol/L — ABNORMAL HIGH (ref 22–32)
Chloride: 97 mmol/L — ABNORMAL LOW (ref 101–111)
Creatinine, Ser: 1.5 mg/dL — ABNORMAL HIGH (ref 0.61–1.24)
GFR, EST AFRICAN AMERICAN: 52 mL/min — AB (ref >60)
GFR, EST NON AFRICAN AMERICAN: 44 mL/min — AB (ref >60)
GLUCOSE: 133 mg/dL — AB (ref 65–99)
POTASSIUM: 3.8 mmol/L (ref 3.5–5.1)
SODIUM: 142 mmol/L (ref 135–145)

## 2018-04-23 LAB — CBC WITH DIFFERENTIAL/PLATELET
BASOS ABS: 0 10*3/uL (ref 0.0–0.1)
BASOS PCT: 0 %
Eosinophils Absolute: 0.3 10*3/uL (ref 0.0–0.7)
Eosinophils Relative: 5 %
HEMATOCRIT: 31.7 % — AB (ref 39.0–52.0)
Hemoglobin: 9.6 g/dL — ABNORMAL LOW (ref 13.0–17.0)
LYMPHS PCT: 16 %
Lymphs Abs: 0.9 10*3/uL (ref 0.7–4.0)
MCH: 29 pg (ref 26.0–34.0)
MCHC: 30.3 g/dL (ref 30.0–36.0)
MCV: 95.8 fL (ref 78.0–100.0)
MONO ABS: 0.4 10*3/uL (ref 0.1–1.0)
Monocytes Relative: 7 %
NEUTROS ABS: 4 10*3/uL (ref 1.7–7.7)
NEUTROS PCT: 72 %
Platelets: 240 10*3/uL (ref 150–400)
RBC: 3.31 MIL/uL — AB (ref 4.22–5.81)
RDW: 16.6 % — AB (ref 11.5–15.5)
WBC: 5.5 10*3/uL (ref 4.0–10.5)

## 2018-04-23 LAB — GLUCOSE, CAPILLARY
GLUCOSE-CAPILLARY: 163 mg/dL — AB (ref 65–99)
GLUCOSE-CAPILLARY: 260 mg/dL — AB (ref 65–99)
GLUCOSE-CAPILLARY: 130 mg/dL — AB (ref 65–99)
Glucose-Capillary: 139 mg/dL — ABNORMAL HIGH (ref 65–99)

## 2018-04-23 MED ORDER — METOLAZONE 2.5 MG PO TABS
2.5000 mg | ORAL_TABLET | Freq: Every day | ORAL | Status: DC
  Administered 2018-04-24: 2.5 mg via ORAL
  Filled 2018-04-23: qty 1

## 2018-04-23 NOTE — Progress Notes (Signed)
Progress Note  Patient Name: Travis [name] Date of Encounter: 04/23/2018  Primary Cardiologist: Mertie Moores, MD   Subjective   Breathing isOK   No CP  Legs uncomfortable being wrapped  Inpatient Medications    Scheduled Meds: . escitalopram  10 mg Oral Daily  . fesoterodine  8 mg Oral Daily  . gabapentin  400 mg Oral TID  . glimepiride  1 mg Oral Q breakfast  . hydrALAZINE  50 mg Oral Q8H  . insulin aspart  0-15 Units Subcutaneous TID WC  . levothyroxine  175 mcg Oral QAC breakfast  . metolazone  2.5 mg Oral Once  . metoprolol tartrate  25 mg Oral BID  . polyethylene glycol  17 g Oral Daily  . potassium chloride  20 mEq Oral BID  . pramipexole  0.5 mg Oral BID  . senna  2 tablet Oral BID  . tamsulosin  0.4 mg Oral Daily  . torsemide  80 mg Oral BID   Continuous Infusions:  PRN Meds: acetaminophen **OR** acetaminophen, bisacodyl, menthol-cetylpyridinium **OR** phenol, ondansetron **OR** ondansetron (ZOFRAN) IV, ondansetron **OR** [DISCONTINUED] ondansetron (ZOFRAN) IV, sorbitol   Vital Signs    Vitals:   04/22/18 0600 04/22/18 1835 04/22/18 2108 04/23/18 0551  BP: 134/62 (!) 126/58 137/76 (!) 127/58  Pulse: 62 (!) 52 60 (!) 57  Resp: 18 17  17   Temp: 98.1 F (36.7 C) 97.7 F (36.5 C)  98.5 F (36.9 C)  TempSrc: Oral Oral  Oral  SpO2: 94% 95%  97%  Weight: 291 lb 0.1 oz (132 kg)   289 lb 14.5 oz (131.5 kg)  Height:        Intake/Output Summary (Last 24 hours) at 04/23/2018 0749 Last data filed at 04/23/2018 0601 Gross per 24 hour  Intake 820 ml  Output 1775 ml  Net -955 ml   Filed Weights   04/21/18 0434 04/22/18 0600 04/23/18 0551  Weight: 290 lb 2 oz (131.6 kg) 291 lb 0.1 oz (132 kg) 289 lb 14.5 oz (131.5 kg)    Physical Exam   GEN: Sitting upright in wheelchair in no acute distress.   Neck: Neck full  Difficult to assess JVP plus in chair   Cardiac: RRR, no murmurs, rubs, or gallops.  Respiratory: Clear GI: NABS, Soft, nontender,  non-distended  MS: 1-2+ LE edema  Feet wrapped  ; No deformity.  Unwrapping   Sore is healed on L shin   Neuro:  Nonfocal, moving all extremities spontaneously Psych: Normal affect   Labs    Chemistry Recent Labs  Lab 04/16/18 1555 04/20/18 0511 04/23/18 0605  NA 143 140 142  K 4.5 3.4* 3.8  CL 100* 95* 97*  CO2 34* 37* 36*  GLUCOSE 214* 131* 133*  BUN 43* 35* 34*  CREATININE 1.39* 1.40* 1.50*  CALCIUM 8.7* 8.3* 8.6*  PROT 6.6  --   --   ALBUMIN 2.7*  --   --   AST 19  --   --   ALT 15*  --   --   ALKPHOS 66  --   --   BILITOT 0.5  --   --   GFRNONAA 49* 48* 44*  GFRAA 56* 56* 52*  ANIONGAP 9 8 9      Hematology Recent Labs  Lab 04/16/18 1555 04/23/18 0605  WBC 5.8 5.5  RBC 3.55* 3.31*  HGB 10.0* 9.6*  HCT 34.1* 31.7*  MCV 96.1 95.8  MCH 28.2 29.0  MCHC 29.3* 30.3  RDW 15.7*  16.6*  PLT 215 240    Cardiac EnzymesNo results for input(s): TROPONINI in the last 168 hours. No results for input(s): TROPIPOC in the last 168 hours.   BNPNo results for input(s): BNP, PROBNP in the last 168 hours.   DDimer No results for input(s): DDIMER in the last 168 hours.   Radiology    No results found.  Cardiac Studies   Echocardiogram 03/06/18: Study Conclusions  - Left ventricle: The cavity size was normal. Wall thickness was increased in a pattern of mild LVH. Systolic function was vigorous. The estimated ejection fraction was in the range of 65% to 70%. Wall motion was normal; there were no regional wall motion abnormalities. Doppler parameters are consistent with pseudonormal left ventricular relaxation (grade 2 diastolic dysfunction). The E/A ratio is >1.5. The E/e&' ratio is >15, suggesting elevated LV filling pressure. - Left atrium: The atrium was mildly dilated. - Inferior vena cava: The vessel was normal in size. The respirophasic diameter changes were in the normal range (>= 50%), consistent with normal central venous  pressure.    Patient Profile     72 y.o. male with chronic diastolic heart failure, OSA, hypertension, diabetes and mild pulmonary hypertension admitted 04/08/18 for back surgery and developed increased shortness of breath post operatively. Cardiology following for acute on chronic diastolic CHF.    Assessment & Plan    1. Acute on chronic diastolic CHF:   Still volume overloaded  He continues to diurese   Not on a low NA diet   Keep on current dose of diuretic  Zaroxolyn 2.5 daily for the next few days   Get daily BMET     2. HTN:  BP is OK    3. Chronic renal insufficiency:    Follow Cr     For questions or updates, please contact [place] Please consult [email or web address] for contact info under Cardiology/STEMI.     Dorris Carnes 04/23/2018, 7:49 AM   [phone]

## 2018-04-23 NOTE — Progress Notes (Signed)
Cardiologist MD removed bandages from leg.  I applied XL Ted Hose to knee. Will continue to monitor.

## 2018-04-23 NOTE — Progress Notes (Signed)
Physical Therapy Weekly Progress Note  Patient Details  Name: Travis [name] MRN: [id number] Date of Birth: 03-Sep-1946  Beginning of progress report period: April 17, 2018 End of progress report period: April 23, 2018  Today's Date: 04/23/2018 PT Individual Time:800-900     Patient has met 3 of 3 short term goals.  Pt making stedy progress towards long term Goals. Significant improvement in BLE strength allowing increased functional mobility noted for the past week. Pt has progressed to mod assist with bed mobility, min assist SB trasnfers, mod assist Stedy trasnfers, and supervision WC mobility for short distances.   Patient continues to demonstrate the following deficits muscle weakness and muscle joint tightness, decreased cardiorespiratoy endurance and decreased oxygen support, decreased problem solving and decreased memory and decreased sitting balance, decreased standing balance and decreased balance strategies and therefore will continue to benefit from skilled PT intervention to increase functional independence with mobility.  Patient progressing toward long term goals..  Continue plan of care.  PT Short Term Goals Week 1:  PT Short Term Goal 1 (Week 1): Pt will perform supine to/from sit with mod assist x 1 PT Short Term Goal 1 - Progress (Week 1): Met PT Short Term Goal 2 (Week 1): Pt will peform least restrictive transfer bed to/from w/c with max assist x 1 PT Short Term Goal 2 - Progress (Week 1): Met PT Short Term Goal 3 (Week 1): Pt will propel manual w/c x 50 ft with max assist PT Short Term Goal 3 - Progress (Week 1): Met Week 2:  PT Short Term Goal 1 (Week 2): Pt will perform sit<>stand with mod assist consistently  PT Short Term Goal 2 (Week 2): Pt will ambulate 49f with LRAD and mod assist  PT Short Term Goal 3 (Week 2): Pt propell WC >1585fwithout rest and supervision assist consistently  PT Short Term Goal 4 (Week 2): Pt will perform bed mobility with min assist   Week 3:     Skilled Therapeutic Interventions/Progress Updates:   Sit<>stand in stedy with mod assist for clothing management following incontinent bladder. PT required to performed clothing management while Pt utilized BUE to maintain standing in stedy and to prevent breaking back precautions.   WC mobility x [id number] 1 rest break at [id number] assist overall with mi ncues for improve force for strokes to limit overall energy consumption.   UE/LE therex:Press ups 2 x 5 with 3 with sec hold with prolonged rest break between bouts. Significant improvement on 2nd bout compared to the first.   Sit<>stand in parallel bars x 4 with max assist progressing to mod assist with PT to block knees and facilitate anterior weight shift.   Gait training in parallel bars x 2ft 49fward/backward, and 3 ft forward/backward. mod-min assist from PT with tactile cues to prevent knee instability.   [facility] returned to room and [facility] and all needs met.        Therapy Documentation Precautions:  Precautions Precautions: Fall, Back, Cervical Restrictions Weight Bearing Restrictions: No Vital Signs: Therapy Vitals Temp: 98.5 F (36.9 C) Temp Source: Oral Pulse Rate: (!) 57 Resp: 17 BP: (!) 127/58 Patient Position (if appropriate): Lying Oxygen Therapy SpO2: 97 % O2 Device: Nasal Cannula O2 Flow Rate (L/min): 3 L/min Pain:   denies.   See Function Navigator for Current Functional Status.  Therapy/Group: Individual Therapy  AustiLorie Phenix/2019, 8:49 AM

## 2018-04-23 NOTE — Progress Notes (Signed)
Occupational Therapy Session Note  Patient Details  Name: Travis [name] MRN: [id number] Date of Birth: 03/30/1946  Today's Date: 04/23/2018 OT Individual Time: 0903-0944 OT Individual Time Calculation (min): 41 min    Short Term Goals: Week 1:  OT Short Term Goal 1 (Week 1): Pt will maintain standing tolerance in stedy for20 minutes while performing grooming at sink OT Short Term Goal 1 - Progress (Week 1): Revised due to lack of progress OT Short Term Goal 2 (Week 1): Pt will perform diaphramitic breathing strategies with 1 questioning cue OT Short Term Goal 2 - Progress (Week 1): Met OT Short Term Goal 3 (Week 1): Pt will transfer from sit to stand from elevated surface with max assist to prepare for donning pants. OT Short Term Goal 3 - Progress (Week 1): Met OT Short Term Goal 4 (Week 1): Pt will verbalize 3/3 back precautions with no cues.   OT Short Term Goal 4 - Progress (Week 1): Met  Skilled Therapeutic Interventions/Progress Updates:    1;1. Direct handoff from PT. Pt propels w/c back to room with 2 rest breaks d/t decreased endurance. Pt grooms at sink in seated shaving and brushing teeth with set up. Pt bathes UB with set up and dons shirt with set up. Ptrwequies VC for donning brace abiding by spinal precautions. Pt washes hair in shampoo cap for BUE strengthening. Pt propels w/c back to gym with 1 rest break. Pt completes 3x10 chest press and weight shift forward with 5# dowel rod to facilitate core strengthening required for weight shift  Forward prior to standing. Pt completes 2x10 toe taps on soccer ball alternating BLE to strengthen lower abdominals and hip flexors in prep for stepping during functional transfers to BSC.  Therapy Documentation Precautions:  Precautions Precautions: Fall, Back, Cervical Restrictions Weight Bearing Restrictions: No  See Function Navigator for Current Functional Status.   Therapy/Group: Individual Therapy   M   04/23/2018, 9:12 AM 

## 2018-04-23 NOTE — Progress Notes (Signed)
Occupational Therapy Weekly Progress Note  Patient Details  Name: Travis [name] MRN: [id number] Date of Birth: 1946/08/16  Beginning of progress report period: April 17, 2018 End of progress report period: April 23, 2018  Today's Date: 04/23/2018 OT Individual Time: 1100-1200 and 1430-1500 OT Individual Time Calculation (min): 60 min and 30 min   Patient has met 3 of 4 short term goals.  Pt is making steady progress towards goals. Pt currently requires max assist sit > stand in [place] with UE support.  Have begun transfer training with slide board with pt able to complete with min-mod assist depending on level of fatigue.  Pt continues to require total assist with LB dressing at bed level or sit > stand in [place] due to back precautions and difficulty elevating BLE to even utilize AE for dressing.  Pt is extremely motivated and demonstrates ability to carry over strategies and education from session to session.    Patient continues to demonstrate the following deficits: muscle weakness and muscle joint tightness, decreased cardiorespiratoy endurance and decreased oxygen support, and decreased standing balance, decreased balance strategies and difficulty maintaining precautions and therefore will continue to benefit from skilled OT intervention to enhance overall performance with BADL and Reduce care partner burden.  Patient progressing toward long term goals..  Continue plan of care.  OT Short Term Goals Week 1:  OT Short Term Goal 1 (Week 1): Pt will maintain standing tolerance in stedy for20 minutes while performing grooming at sink OT Short Term Goal 1 - Progress (Week 1): Revised due to lack of progress OT Short Term Goal 2 (Week 1): Pt will perform diaphramitic breathing strategies with 1 questioning cue OT Short Term Goal 2 - Progress (Week 1): Met OT Short Term Goal 3 (Week 1): Pt will transfer from sit to stand from elevated surface with max assist to prepare for donning pants. OT  Short Term Goal 3 - Progress (Week 1): Met OT Short Term Goal 4 (Week 1): Pt will verbalize 3/3 back precautions with no cues.   OT Short Term Goal 4 - Progress (Week 1): Met Week 2:  OT Short Term Goal 1 (Week 2): Pt will tolerate standing 5 mins in [place] to complete 2 grooming tasks with increased endurance OT Short Term Goal 2 (Week 2): Pt will complete LB dressing with max assist with use of AE OT Short Term Goal 3 (Week 2): Pt will complete toilet transfer with mod assist with LRAD  Skilled Therapeutic Interventions/Progress Updates:   1) Treatment session with focus on functional mobility, sit > stand, and standing tolerance to decrease burden of care with ADLs.  Completed slide board transfer w/c > therapy mat with setup assist to place slide board and min assist for transfer.  Engaged in sit > stand with Stedy with max assist and cues for weight shift.  Engaged in sit > stand x3 from [place] with increased focus on anterior weight shift and lift off from higher surface.  Engaged in Connect 4 activity in standing with focus on alternating UE support while standing to progress towards clothing management in standing.  Pt able to tolerate standing 3-4 mins during each stand with improved upright posture and decreased UE support.  Engaged in toe taps while following pattern to challenge weight shift for dynamic sitting balance and to strengthen lower abdominals and hip flexors and needed for functional transfers.  2) Treatment session with focus on activity tolerance and endurance.  Pt received upright in w/c  willing to engage in therapy session.  Pt propelled w/c >100 feet to therapy gym while navigating obstacles with supervision.  Engaged in table top activity with focus on breathing strategies.  Pt propelled back to room as above without rest break.  O2 sats remained > 90% on 3L during therapeutic activity.  Therapy Documentation Precautions:  Precautions Precautions: Fall, Back,  Cervical Restrictions Weight Bearing Restrictions: No General:   Vital Signs:  Pain:   ADL:   Vision   Perception    Praxis   Exercises:   Other Treatments:    See Function Navigator for Current Functional Status.   Therapy/Group: Individual Therapy  [name] 04/23/2018, 9:52 AM

## 2018-04-23 NOTE — Progress Notes (Signed)
Subjective/Complaints: Patient seen Sitting up at the edge of his bed this morning about to eat breakfast. He states he slept well overnight.  ROS: denies CP, SOB, N/V/D   Objective: Vital Signs: Blood pressure (!) 127/58, pulse (!) 57, temperature 98.5 F (36.9 C), temperature source Oral, resp. rate 17, height 6' 1" (1.854 m), weight 131.5 kg (289 lb 14.5 oz), SpO2 97 %. No results found. Results for orders placed or performed during the hospital encounter of 04/16/18 (from the past 72 hour(s))  Glucose, capillary     Status: Abnormal   Collection Time: 04/20/18 11:50 AM  Result Value Ref Range   Glucose-Capillary 178 (H) 65 - 99 mg/dL  Glucose, capillary     Status: Abnormal   Collection Time: 04/20/18  4:54 PM  Result Value Ref Range   Glucose-Capillary 176 (H) 65 - 99 mg/dL  Glucose, capillary     Status: Abnormal   Collection Time: 04/20/18  9:29 PM  Result Value Ref Range   Glucose-Capillary 164 (H) 65 - 99 mg/dL  Glucose, capillary     Status: Abnormal   Collection Time: 04/21/18  7:10 AM  Result Value Ref Range   Glucose-Capillary 135 (H) 65 - 99 mg/dL  Glucose, capillary     Status: Abnormal   Collection Time: 04/21/18 11:25 AM  Result Value Ref Range   Glucose-Capillary 175 (H) 65 - 99 mg/dL  Glucose, capillary     Status: Abnormal   Collection Time: 04/21/18  4:35 PM  Result Value Ref Range   Glucose-Capillary 180 (H) 65 - 99 mg/dL  Glucose, capillary     Status: Abnormal   Collection Time: 04/21/18  9:49 PM  Result Value Ref Range   Glucose-Capillary 141 (H) 65 - 99 mg/dL   Comment 1 Notify RN   Glucose, capillary     Status: Abnormal   Collection Time: 04/22/18  6:38 AM  Result Value Ref Range   Glucose-Capillary 136 (H) 65 - 99 mg/dL   Comment 1 Notify RN   Glucose, capillary     Status: Abnormal   Collection Time: 04/22/18 11:58 AM  Result Value Ref Range   Glucose-Capillary 146 (H) 65 - 99 mg/dL   Comment 1 Notify RN   Glucose, capillary      Status: Abnormal   Collection Time: 04/22/18  4:42 PM  Result Value Ref Range   Glucose-Capillary 154 (H) 65 - 99 mg/dL   Comment 1 Notify RN   Glucose, capillary     Status: Abnormal   Collection Time: 04/22/18  9:45 PM  Result Value Ref Range   Glucose-Capillary 163 (H) 65 - 99 mg/dL  Basic metabolic panel     Status: Abnormal   Collection Time: 04/23/18  6:05 AM  Result Value Ref Range   Sodium 142 135 - 145 mmol/L   Potassium 3.8 3.5 - 5.1 mmol/L   Chloride 97 (L) 101 - 111 mmol/L   CO2 36 (H) 22 - 32 mmol/L   Glucose, Bld 133 (H) 65 - 99 mg/dL   BUN 34 (H) 6 - 20 mg/dL   Creatinine, Ser 1.50 (H) 0.61 - 1.24 mg/dL   Calcium 8.6 (L) 8.9 - 10.3 mg/dL   GFR calc non Af Amer 44 (L) >60 mL/min   GFR calc Af Amer 52 (L) >60 mL/min    Comment: (NOTE) The eGFR has been calculated using the CKD EPI equation. This calculation has not been validated in all clinical situations. eGFR's persistently <60 mL/min signify  possible Chronic Kidney Disease.    Anion gap 9 5 - 15    Comment: Performed at [facility] [address], [place], [place] [address]  CBC with Differential/Platelet     Status: Abnormal   Collection Time: 04/23/18  6:05 AM  Result Value Ref Range   WBC 5.5 4.0 - 10.5 K/uL   RBC 3.31 (L) 4.22 - 5.81 MIL/uL   Hemoglobin 9.6 (L) 13.0 - 17.0 g/dL   HCT 31.7 (L) 39.0 - 52.0 %   MCV 95.8 78.0 - 100.0 fL   MCH 29.0 26.0 - 34.0 pg   MCHC 30.3 30.0 - 36.0 g/dL   RDW 16.6 (H) 11.5 - 15.5 %   Platelets 240 150 - 400 K/uL   Neutrophils Relative % 72 %   Neutro Abs 4.0 1.7 - 7.7 K/uL   Lymphocytes Relative 16 %   Lymphs Abs 0.9 0.7 - 4.0 K/uL   Monocytes Relative 7 %   Monocytes Absolute 0.4 0.1 - 1.0 K/uL   Eosinophils Relative 5 %   Eosinophils Absolute 0.3 0.0 - 0.7 K/uL   Basophils Relative 0 %   Basophils Absolute 0.0 0.0 - 0.1 K/uL    Comment: Performed at [facility] [address] [place], [place] [address]  Glucose, capillary     Status:  Abnormal   Collection Time: 04/23/18  6:51 AM  Result Value Ref Range   Glucose-Capillary 139 (H) 65 - 99 mg/dL    Gen NAD. Vital signs reviewed. Constitutional: No distress . Vital signs reviewed. HENT: Normocephalic, atraumatic. Eyes: EOMI. No discharge.  Cardiovascular: RRR. No JVD    Respiratory: CTA Bilaterally.  Normal effort. GI: BS +, non-distended  Musc/Skel:  No edema. No tenderness. Neuro: Alert and Oriented  Motor: 5/5 BUE proximal to distal 3-/5 BLE HF, 4-/5 distally Skin: Intact. Warm and dry.   Assessment/Plan: 1. 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 Physiatrist is providing close team supervision and 24 hour management of active medical problems listed below. Physiatrist and rehab team continue to assess barriers to discharge/monitor patient progress toward functional and medical goals. FIM: Function - Bathing Position: Bed(LB at bed level and UB seated EOB) Body parts bathed by patient: Right arm, Left arm, Chest, Abdomen, Front perineal area Body parts bathed by helper: Buttocks, Right upper leg, Left upper leg, Back Bathing not applicable: Left lower leg, Right lower leg(Unna boots) Assist Level: (Mod assist)  Function- Upper Body Dressing/Undressing What is the patient wearing?: Pull over shirt/dress Pull over shirt/dress - Perfomed by patient: Thread/unthread right sleeve, Thread/unthread left sleeve, Put head through opening, Pull shirt over trunk Assist Level: Set up Function - Lower Body Dressing/Undressing What is the patient wearing?: Pants Position: Bed Pants- Performed by helper: Thread/unthread right pants leg, Thread/unthread left pants leg, Pull pants up/down Assist for footwear: Dependant Assist for lower body dressing: (Total assist)  Function - Toileting Toileting activity did not occur: No continent bowel/bladder event Toileting  steps completed by helper: Adjust clothing prior to toileting, Performs perineal hygiene, Adjust clothing after toileting Toileting Assistive Devices: Other (comment)(sara lift) Assist level: Two helpers  Function - Air cabin crew transfer activity did not occur: Safety/medical concerns Assist level to toilet: Moderate assist (Pt 50 - 74%/lift or lower)(per NT report) Assist level from toilet: Touching or steadying assistance (Pt > 75%)  Function - Chair/bed transfer Chair/bed transfer method: Lateral scoot Chair/bed  transfer assist level: Maximal assist (Pt 25 - 49%/lift and lower) Chair/bed transfer assistive device: Sliding board Mechanical lift: Clarise Cruz Chair/bed transfer details: Verbal cues for sequencing, Verbal cues for technique, Verbal cues for precautions/safety, Verbal cues for safe use of DME/AE, Manual facilitation for weight shifting, Manual facilitation for placement, Manual facilitation for weight bearing  Function - Locomotion: Wheelchair Will patient use wheelchair at discharge?: Yes Type: Manual Max wheelchair distance: 150 Assist Level: Supervision or verbal cues Wheel 50 feet with 2 turns activity did not occur: Safety/medical concerns Assist Level: Supervision or verbal cues Wheel 150 feet activity did not occur: Safety/medical concerns Assist Level: Supervision or verbal cues Turns around,maneuvers to table,bed, and toilet,negotiates 3% grade,maneuvers on rugs and over doorsills: No Function - Locomotion: Ambulation Ambulation activity did not occur: Safety/medical concerns  Function - Comprehension Comprehension: Auditory Comprehension assist level: Understands complex 90% of the time/cues 10% of the time  Function - Expression Expression: Verbal Expression assist level: Expresses complex 90% of the time/cues < 10% of the time  Function - Social Interaction Social Interaction assist level: Interacts appropriately 90% of the time - Needs monitoring  or encouragement for participation or interaction.  Function - Problem Solving Problem solving assist level: Solves basic problems with no assist  Function - Memory Memory assist level: Recognizes or recalls 90% of the time/requires cueing < 10% of the time Patient normally able to recall (first 3 days only): Current season, Staff names and faces, That he or she is in a hospital  Medical Problem List and Plan: 1.  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  DVT Prophylaxis/Anticoagulation: SCDs.      Vascular study negative but unna boots limited study 3. Pain Management: Neurontin 400 mg 3 times daily 4. Mood: Lexapro 10 mg daily 5. Neuropsych: This patient is not fully capable of making decisions on his own behalf. 6. Skin/Wound Care: Pressure relief measures.  7. Fluids/Electrolytes/Nutrition: Strict I/Os. Heart healthy diet. Monitor voiding function.  8. OSA with acute hypercarbic respiratory failure: Has to use BIPAP or CPAP at nights 9. Acute on chronic diastolic CHF:  Improved with lasix which has been discontinued and changed to Demadex and hydralazine dose adjusted. Monitor weights daily with strict I/Os.       Filed Weights   04/21/18 0434 04/22/18 0600 04/23/18 0551  Weight: 131.6 kg (290 lb 2 oz) 132 kg (291 lb 0.1 oz) 131.5 kg (289 lb 14.5 oz)    Stable on 4/26  Managed by cards, appreciate recs 10 Acute on chronic renal failure.   May require further aggressive diuresis.    Continue Demadex 80 mg twice daily as well as hydralazine 50 mg 3 times daily   ?Restart ARB this week per Cards   Cr 1.50 on 4/26   Cont to monitor 11. Morbid obesity: BMI 40.46.  12. Hypoxic encephalopathy:   Resolved    limit sedating medications. Encourage IS. CPAP whenever asleep. 13.  Hypertension.  Lopressor 25 mg twice daily.    Monitor with increased mobility  Overall controlled on 4/2 14.   BPH.  Flomax 0.4 mg daily, Toviaz 8 mg daily.   15.  Diabetes mellitus with peripheral neuropathy.  Hemoglobin A1c 7.7.  SSI.  Check blood sugars before meals and at bedtime   On amaryl at home, resumed. Titrate regimen as needed   Relatively controlled on 4/26 16.  Fevers.  Resolved. Blood cultures no growth.  Urine culture negative. Completed course of Omnicef 17.  Hypothyroidism.  Synthroid 18.  Constipation.  Laxative assistance, increased Senokot to 2 tablets twice a day, continue MiraLAX 1 packet/day   Dulcolax prn 19. Hypokalemia  Potassium 3.8 on 4/26  Supplemented x1 day on 4/23   Cont to monitor 20. Acute blood loss anemia  Hemoglobin 9.6 on 4/26  Continue to monitor  LOS (Days) 7 A FACE TO FACE EVALUATION WAS PERFORMED  Ankit Lorie Phenix 04/23/2018, 8:20 AM

## 2018-04-24 ENCOUNTER — Inpatient Hospital Stay (HOSPITAL_COMMUNITY): Payer: Medicare Other

## 2018-04-24 DIAGNOSIS — R5381 Other malaise: Secondary | ICD-10-CM

## 2018-04-24 LAB — GLUCOSE, CAPILLARY
GLUCOSE-CAPILLARY: 107 mg/dL — AB (ref 65–99)
GLUCOSE-CAPILLARY: 150 mg/dL — AB (ref 65–99)
GLUCOSE-CAPILLARY: 266 mg/dL — AB (ref 65–99)
GLUCOSE-CAPILLARY: 141 mg/dL — AB (ref 65–99)

## 2018-04-24 LAB — BASIC METABOLIC PANEL
ANION GAP: 9 (ref 5–15)
BUN: 34 mg/dL — ABNORMAL HIGH (ref 6–20)
CHLORIDE: 93 mmol/L — AB (ref 101–111)
CO2: 38 mmol/L — ABNORMAL HIGH (ref 22–32)
Calcium: 9 mg/dL (ref 8.9–10.3)
Creatinine, Ser: 1.64 mg/dL — ABNORMAL HIGH (ref 0.61–1.24)
GFR calc non Af Amer: 40 mL/min — ABNORMAL LOW (ref >60)
GFR, EST AFRICAN AMERICAN: 46 mL/min — AB (ref >60)
Glucose, Bld: 121 mg/dL — ABNORMAL HIGH (ref 65–99)
POTASSIUM: 3.7 mmol/L (ref 3.5–5.1)
SODIUM: 140 mmol/L (ref 135–145)

## 2018-04-24 NOTE — Progress Notes (Signed)
Patient has home CPAP unit at bedside and is able to place self on and off of CPAP when ready.

## 2018-04-24 NOTE — Progress Notes (Addendum)
Progress Note  Patient Name: Travis [name] Date of Encounter: 04/24/2018  Primary Cardiologist: Mertie Moores, MD   Subjective   Improved SOB.  Inpatient Medications    Scheduled Meds: . escitalopram  10 mg Oral Daily  . fesoterodine  8 mg Oral Daily  . gabapentin  400 mg Oral TID  . glimepiride  1 mg Oral Q breakfast  . hydrALAZINE  50 mg Oral Q8H  . insulin aspart  0-15 Units Subcutaneous TID WC  . levothyroxine  175 mcg Oral QAC breakfast  . metolazone  2.5 mg Oral Daily  . metoprolol tartrate  25 mg Oral BID  . polyethylene glycol  17 g Oral Daily  . potassium chloride  20 mEq Oral BID  . pramipexole  0.5 mg Oral BID  . senna  2 tablet Oral BID  . tamsulosin  0.4 mg Oral Daily  . torsemide  80 mg Oral BID   Continuous Infusions:  PRN Meds: acetaminophen **OR** acetaminophen, bisacodyl, menthol-cetylpyridinium **OR** phenol, ondansetron **OR** ondansetron (ZOFRAN) IV, ondansetron **OR** [DISCONTINUED] ondansetron (ZOFRAN) IV, sorbitol   Vital Signs    Vitals:   04/23/18 2132 04/23/18 2341 04/24/18 0650 04/24/18 0830  BP: 125/65 (!) 146/76 (!) 129/56 (!) 131/50  Pulse: (!) 59   70  Resp:      Temp:      TempSrc:      SpO2:      Weight:      Height:        Intake/Output Summary (Last 24 hours) at 04/24/2018 1143 Last data filed at 04/24/2018 1034 Gross per 24 hour  Intake 480 ml  Output 2360 ml  Net -1880 ml   Filed Weights   04/21/18 0434 04/22/18 0600 04/23/18 0551  Weight: 290 lb 2 oz (131.6 kg) 291 lb 0.1 oz (132 kg) 289 lb 14.5 oz (131.5 kg)    Physical Exam   GEN: Sitting upright in wheelchair in no acute distress.   Neck: Neck full  Difficult to assess JVP plus in chair   Cardiac: RRR, no murmurs, rubs, or gallops.  Respiratory: Clear GI: NABS, Soft, nontender, non-distended  MS: 1-2+ LE edema  Feet wrapped  ; No deformity.  Unwrapping   Sore is healed on L shin   Neuro:  Nonfocal, moving all extremities spontaneously Psych: Normal  affect   Labs    Chemistry Recent Labs  Lab 04/20/18 0511 04/23/18 0605 04/24/18 0725  NA 140 142 140  K 3.4* 3.8 3.7  CL 95* 97* 93*  CO2 37* 36* 38*  GLUCOSE 131* 133* 121*  BUN 35* 34* 34*  CREATININE 1.40* 1.50* 1.64*  CALCIUM 8.3* 8.6* 9.0  GFRNONAA 48* 44* 40*  GFRAA 56* 52* 46*  ANIONGAP 8 9 9      Hematology Recent Labs  Lab 04/23/18 0605  WBC 5.5  RBC 3.31*  HGB 9.6*  HCT 31.7*  MCV 95.8  MCH 29.0  MCHC 30.3  RDW 16.6*  PLT 240    Cardiac EnzymesNo results for input(s): TROPONINI in the last 168 hours. No results for input(s): TROPIPOC in the last 168 hours.   BNPNo results for input(s): BNP, PROBNP in the last 168 hours.   DDimer No results for input(s): DDIMER in the last 168 hours.   Radiology    No results found.  Cardiac Studies   Echocardiogram 03/06/18: Study Conclusions  - Left ventricle: The cavity size was normal. Wall thickness was increased in a pattern of mild LVH. Systolic  function was vigorous. The estimated ejection fraction was in the range of 65% to 70%. Wall motion was normal; there were no regional wall motion abnormalities. Doppler parameters are consistent with pseudonormal left ventricular relaxation (grade 2 diastolic dysfunction). The E/A ratio is >1.5. The E/e&' ratio is >15, suggesting elevated LV filling pressure. - Left atrium: The atrium was mildly dilated. - Inferior vena cava: The vessel was normal in size. The respirophasic diameter changes were in the normal range (>= 50%), consistent with normal central venous pressure.    Patient Profile     72 y.o. male with chronic diastolic heart failure, OSA, hypertension, diabetes and mild pulmonary hypertension admitted 04/08/18 for back surgery and developed increased shortness of breath post operatively. Cardiology following for acute on chronic diastolic CHF.  Assessment & Plan    1. Acute on chronic diastolic CHF:   Still volume overloaded,  however Crea up 1.3 ->1.5 -> 1.6, negative 1.8 L since yesterday, I will hold metolazone and tonights dose of torsemide, and follow Crea, weights. Not on a low NA diet    2. HTN:  BP is OK    3. Chronic renal insufficiency:    creatinine trending up, hold metolazone and torsemide tonight  For questions or updates, please contact [facility] Please consult [email or web address] for contact info under Cardiology/STEMI.     Ena Dawley 04/24/2018, 11:43 AM   [phone]

## 2018-04-24 NOTE — Progress Notes (Addendum)
Subjective/Complaints: Patient had a good night sleep.  No problems overnight.  Tolerating CPAP.  ROS: Patient denies fever, rash, sore throat, blurred vision, nausea, vomiting, diarrhea, cough, shortness of breath or chest pain, joint or back pain, headache, or mood change.   Objective: Vital Signs: Blood pressure (!) 129/56, pulse (!) 59, temperature (!) 97.5 F (36.4 C), temperature source Oral, resp. rate 19, height 6' 1"  (1.854 m), weight 131.5 kg (289 lb 14.5 oz), SpO2 96 %. No results found. Results for orders placed or performed during the hospital encounter of 04/16/18 (from the past 72 hour(s))  Glucose, capillary     Status: Abnormal   Collection Time: 04/21/18 11:25 AM  Result Value Ref Range   Glucose-Capillary 175 (H) 65 - 99 mg/dL  Glucose, capillary     Status: Abnormal   Collection Time: 04/21/18  4:35 PM  Result Value Ref Range   Glucose-Capillary 180 (H) 65 - 99 mg/dL  Glucose, capillary     Status: Abnormal   Collection Time: 04/21/18  9:49 PM  Result Value Ref Range   Glucose-Capillary 141 (H) 65 - 99 mg/dL   Comment 1 Notify RN   Glucose, capillary     Status: Abnormal   Collection Time: 04/22/18  6:38 AM  Result Value Ref Range   Glucose-Capillary 136 (H) 65 - 99 mg/dL   Comment 1 Notify RN   Glucose, capillary     Status: Abnormal   Collection Time: 04/22/18 11:58 AM  Result Value Ref Range   Glucose-Capillary 146 (H) 65 - 99 mg/dL   Comment 1 Notify RN   Glucose, capillary     Status: Abnormal   Collection Time: 04/22/18  4:42 PM  Result Value Ref Range   Glucose-Capillary 154 (H) 65 - 99 mg/dL   Comment 1 Notify RN   Glucose, capillary     Status: Abnormal   Collection Time: 04/22/18  9:45 PM  Result Value Ref Range   Glucose-Capillary 163 (H) 65 - 99 mg/dL  Basic metabolic panel     Status: Abnormal   Collection Time: 04/23/18  6:05 AM  Result Value Ref Range   Sodium 142 135 - 145 mmol/L   Potassium 3.8 3.5 - 5.1 mmol/L   Chloride 97 (L)  101 - 111 mmol/L   CO2 36 (H) 22 - 32 mmol/L   Glucose, Bld 133 (H) 65 - 99 mg/dL   BUN 34 (H) 6 - 20 mg/dL   Creatinine, Ser 1.50 (H) 0.61 - 1.24 mg/dL   Calcium 8.6 (L) 8.9 - 10.3 mg/dL   GFR calc non Af Amer 44 (L) >60 mL/min   GFR calc Af Amer 52 (L) >60 mL/min    Comment: (NOTE) The eGFR has been calculated using the CKD EPI equation. This calculation has not been validated in all clinical situations. eGFR's persistently <60 mL/min signify possible Chronic Kidney Disease.    Anion gap 9 5 - 15    Comment: Performed at [facility] [address], [place], [place] [address]  CBC with Differential/Platelet     Status: Abnormal   Collection Time: 04/23/18  6:05 AM  Result Value Ref Range   WBC 5.5 4.0 - 10.5 K/uL   RBC 3.31 (L) 4.22 - 5.81 MIL/uL   Hemoglobin 9.6 (L) 13.0 - 17.0 g/dL   HCT 31.7 (L) 39.0 - 52.0 %   MCV 95.8 78.0 - 100.0 fL   MCH 29.0 26.0 - 34.0 pg   MCHC 30.3 30.0 -  36.0 g/dL   RDW 16.6 (H) 11.5 - 15.5 %   Platelets 240 150 - 400 K/uL   Neutrophils Relative % 72 %   Neutro Abs 4.0 1.7 - 7.7 K/uL   Lymphocytes Relative 16 %   Lymphs Abs 0.9 0.7 - 4.0 K/uL   Monocytes Relative 7 %   Monocytes Absolute 0.4 0.1 - 1.0 K/uL   Eosinophils Relative 5 %   Eosinophils Absolute 0.3 0.0 - 0.7 K/uL   Basophils Relative 0 %   Basophils Absolute 0.0 0.0 - 0.1 K/uL    Comment: Performed at [facility] [address], [place], [place] [address]  Glucose, capillary     Status: Abnormal   Collection Time: 04/23/18  6:51 AM  Result Value Ref Range   Glucose-Capillary 139 (H) 65 - 99 mg/dL  Glucose, capillary     Status: Abnormal   Collection Time: 04/23/18 12:07 PM  Result Value Ref Range   Glucose-Capillary 163 (H) 65 - 99 mg/dL   Comment 1 Notify RN   Glucose, capillary     Status: Abnormal   Collection Time: 04/23/18  4:29 PM  Result Value Ref Range   Glucose-Capillary 260 (H) 65 - 99 mg/dL   Comment 1 Notify RN   Glucose, capillary     Status:  Abnormal   Collection Time: 04/23/18  8:54 PM  Result Value Ref Range   Glucose-Capillary 130 (H) 65 - 99 mg/dL  Glucose, capillary     Status: Abnormal   Collection Time: 04/24/18  6:46 AM  Result Value Ref Range   Glucose-Capillary 107 (H) 65 - 99 mg/dL    Constitutional: No distress . Vital signs reviewed. HEENT: EOMI, oral membranes moist Neck: supple Cardiovascular: RRR without murmur. No JVD    Respiratory: CTA Bilaterally without wheezes or rales. Normal effort    GI: BS +, non-tender, non-distended  Musc/Skel:  No edema. No tenderness. Neuro: Alert and Oriented  Motor: 5/5 BUE proximal to distal 3-/5 BLE HF, 4-/5 distally Skin: Intact. Warm and dry. Crease from cpap mask  Assessment/Plan: 1. 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 Physiatrist is providing close team supervision and 24 hour management of active medical problems listed below. Physiatrist and rehab team continue to assess barriers to discharge/monitor patient progress toward functional and medical goals. FIM: Function - Bathing Position: Bed(LB at bed level and UB seated EOB) Body parts bathed by patient: Right arm, Left arm, Chest, Abdomen, Front perineal area Body parts bathed by helper: Buttocks, Right upper leg, Left upper leg, Back Bathing not applicable: Left lower leg, Right lower leg(Unna boots) Assist Level: (Mod assist)  Function- Upper Body Dressing/Undressing What is the patient wearing?: Pull over shirt/dress Pull over shirt/dress - Perfomed by patient: Thread/unthread right sleeve, Thread/unthread left sleeve, Put head through opening, Pull shirt over trunk Assist Level: Set up Function - Lower Body Dressing/Undressing What is the patient wearing?: Pants Position: Bed Pants- Performed by helper: Thread/unthread right pants leg, Thread/unthread left pants leg, Pull pants  up/down Assist for footwear: Dependant Assist for lower body dressing: (Total assist)  Function - Toileting Toileting activity did not occur: No continent bowel/bladder event Toileting steps completed by helper: Adjust clothing prior to toileting, Performs perineal hygiene, Adjust clothing after toileting Toileting Assistive Devices: Other (comment)(sara lift) Assist level: Two helpers  Function - Air cabin crew transfer activity did not occur: Safety/medical concerns Assist level to  toilet: Moderate assist (Pt 50 - 74%/lift or lower)(per NT report) Assist level from toilet: Touching or steadying assistance (Pt > 75%)  Function - Chair/bed transfer Chair/bed transfer method: Lateral scoot Chair/bed transfer assist level: Maximal assist (Pt 25 - 49%/lift and lower) Chair/bed transfer assistive device: Sliding board Mechanical lift: Clarise Cruz Chair/bed transfer details: Verbal cues for sequencing, Verbal cues for technique, Verbal cues for precautions/safety, Verbal cues for safe use of DME/AE, Manual facilitation for weight shifting, Manual facilitation for placement, Manual facilitation for weight bearing  Function - Locomotion: Wheelchair Will patient use wheelchair at discharge?: Yes Type: Manual Max wheelchair distance: 150 Assist Level: Supervision or verbal cues Wheel 50 feet with 2 turns activity did not occur: Safety/medical concerns Assist Level: Supervision or verbal cues Wheel 150 feet activity did not occur: Safety/medical concerns Assist Level: Supervision or verbal cues Turns around,maneuvers to table,bed, and toilet,negotiates 3% grade,maneuvers on rugs and over doorsills: No Function - Locomotion: Ambulation Ambulation activity did not occur: Safety/medical concerns  Function - Comprehension Comprehension: Auditory Comprehension assist level: Understands complex 90% of the time/cues 10% of the time  Function - Expression Expression: Verbal Expression assist  level: Expresses complex 90% of the time/cues < 10% of the time  Function - Social Interaction Social Interaction assist level: Interacts appropriately 90% of the time - Needs monitoring or encouragement for participation or interaction.  Function - Problem Solving Problem solving assist level: Solves basic problems with no assist  Function - Memory Memory assist level: Recognizes or recalls 90% of the time/requires cueing < 10% of the time Patient normally able to recall (first 3 days only): Current season, Staff names and faces, That he or she is in a hospital  Medical Problem List and Plan: 1.  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  DVT Prophylaxis/Anticoagulation: SCDs.      Vascular study negative but unna boots limited study 3. Pain Management: Neurontin 400 mg 3 times daily 4. Mood: Lexapro 10 mg daily 5. Neuropsych: This patient is not fully capable of making decisions on his own behalf. 6. Skin/Wound Care: Pressure relief measures.  7. Fluids/Electrolytes/Nutrition: Strict I/Os. Heart healthy diet. Monitor voiding function.  8. OSA with acute hypercarbic respiratory failure: Has to use BIPAP or CPAP at nights 9. Acute on chronic diastolic CHF:  Improved with lasix which has been discontinued and changed to Demadex and hydralazine dose adjusted. Monitor weights daily with strict I/Os.       Filed Weights   04/21/18 0434 04/22/18 0600 04/23/18 0551  Weight: 131.6 kg (290 lb 2 oz) 132 kg (291 lb 0.1 oz) 131.5 kg (289 lb 14.5 oz)    Stable on 4/27  Managed by cards, appreciate recs 10 Acute on chronic renal failure.       Continue Demadex 80 mg twice daily as well as hydralazine 50 mg 3 times daily   ?Restart ARB this week per Cards--defer to their recommendations   Cr upto 1.6 today, may need to back of diuretics---follow labsdaily    Cont to monitor 11. Morbid  obesity: BMI 40.46.  12. Hypoxic encephalopathy:   Resolved    limit sedating medications. Encourage IS. CPAP whenever asleep. 13.  Hypertension.  Lopressor 25 mg twice daily.    Monitor with increased mobility  Overall controlled on 4/27 14.  BPH.  Flomax 0.4 mg daily, Toviaz 8 mg daily.   15.  Diabetes mellitus with peripheral neuropathy.  Hemoglobin A1c 7.7.  SSI.  Check blood sugars before meals and at bedtime   On amaryl at home, resumed. Titrate regimen as needed   Relatively controlled on 4/27 16.  Fevers.  Resolved. Blood cultures no growth.  Urine culture negative. Completed course of Omnicef 17.  Hypothyroidism.  Synthroid 18.  Constipation.  Laxative assistance, increased Senokot to 2 tablets twice a day, continue MiraLAX 1 packet/day   Dulcolax prn 19. Hypokalemia  Potassium 3.8 on 4/26, labs pending today  Supplemented x1 day on 4/23   Cont to monitor 20. Acute blood loss anemia  Hemoglobin 9.6 on 4/26  Continue to monitor  LOS (Days) 8 A FACE TO FACE EVALUATION WAS PERFORMED  Meredith Staggers 04/24/2018, 7:53 AM

## 2018-04-24 NOTE — Progress Notes (Signed)
Occupational Therapy Session Note  Patient Details  Name: Travis [name] MRN: [id number] Date of Birth: May 31, 1946  Today's Date: 04/24/2018 OT Individual Time: 1330-1400 OT Individual Time Calculation (min): 30 min    Short Term Goals: Week 1:  OT Short Term Goal 1 (Week 1): Pt will maintain standing tolerance in stedy for20 minutes while performing grooming at sink OT Short Term Goal 1 - Progress (Week 1): Revised due to lack of progress OT Short Term Goal 2 (Week 1): Pt will perform diaphramitic breathing strategies with 1 questioning cue OT Short Term Goal 2 - Progress (Week 1): Met OT Short Term Goal 3 (Week 1): Pt will transfer from sit to stand from elevated surface with max assist to prepare for donning pants. OT Short Term Goal 3 - Progress (Week 1): Met OT Short Term Goal 4 (Week 1): Pt will verbalize 3/3 back precautions with no cues.   OT Short Term Goal 4 - Progress (Week 1): Met  Skilled Therapeutic Interventions/Progress Updates:    1:1. Pt seated in w/c with no c/o pain and ready to go. Pt propels w/c to/from gym with no rest breaks and supervision. Pt completes sit to stand in parallel bars with min-mod A with knee block and facilitation of trunk flexion in prep for clothing management. In standing pt completes 1x5 marches, side steps and front taps. Pt ambulates 4x5 steps forward/backward in parallel bars with no rest breaks and CGA. Exited session with pt seated in w/c with call light in reahc and all needs met  Therapy Documentation Precautions:  Precautions Precautions: Fall, Back, Cervical Restrictions Weight Bearing Restrictions: No General:    See Function Navigator for Current Functional Status.   Therapy/Group: Individual Therapy  Tonny Branch 04/24/2018, 1:58 PM

## 2018-04-25 LAB — BASIC METABOLIC PANEL
ANION GAP: 9 (ref 5–15)
BUN: 37 mg/dL — ABNORMAL HIGH (ref 6–20)
CALCIUM: 8.8 mg/dL — AB (ref 8.9–10.3)
CO2: 40 mmol/L — ABNORMAL HIGH (ref 22–32)
Chloride: 93 mmol/L — ABNORMAL LOW (ref 101–111)
Creatinine, Ser: 1.85 mg/dL — ABNORMAL HIGH (ref 0.61–1.24)
GFR, EST AFRICAN AMERICAN: 40 mL/min — AB (ref >60)
GFR, EST NON AFRICAN AMERICAN: 34 mL/min — AB (ref >60)
Glucose, Bld: 126 mg/dL — ABNORMAL HIGH (ref 65–99)
Potassium: 3.8 mmol/L (ref 3.5–5.1)
SODIUM: 142 mmol/L (ref 135–145)

## 2018-04-25 LAB — GLUCOSE, CAPILLARY
GLUCOSE-CAPILLARY: 156 mg/dL — AB (ref 65–99)
GLUCOSE-CAPILLARY: 213 mg/dL — AB (ref 65–99)
Glucose-Capillary: 124 mg/dL — ABNORMAL HIGH (ref 65–99)
Glucose-Capillary: 144 mg/dL — ABNORMAL HIGH (ref 65–99)

## 2018-04-25 NOTE — Progress Notes (Addendum)
Subjective/Complaints: No new issues. Breathing unchanged. Happy that he was walking in parallel bars yesterday  ROS: Patient denies fever, rash, sore throat, blurred vision, nausea, vomiting, diarrhea, cough, shortness of breath or chest pain, joint or back pain, headache, or mood change.   Objective: Vital Signs: Blood pressure (!) 121/56, pulse (!) 50, temperature 98.6 F (37 C), temperature source Oral, resp. rate 20, height 6' 1"  (1.854 m), weight 127.5 kg (281 lb 1.4 oz), SpO2 97 %. No results found. Results for orders placed or performed during the hospital encounter of 04/16/18 (from the past 72 hour(s))  Glucose, capillary     Status: Abnormal   Collection Time: 04/22/18 11:58 AM  Result Value Ref Range   Glucose-Capillary 146 (H) 65 - 99 mg/dL   Comment 1 Notify RN   Glucose, capillary     Status: Abnormal   Collection Time: 04/22/18  4:42 PM  Result Value Ref Range   Glucose-Capillary 154 (H) 65 - 99 mg/dL   Comment 1 Notify RN   Glucose, capillary     Status: Abnormal   Collection Time: 04/22/18  9:45 PM  Result Value Ref Range   Glucose-Capillary 163 (H) 65 - 99 mg/dL  Basic metabolic panel     Status: Abnormal   Collection Time: 04/23/18  6:05 AM  Result Value Ref Range   Sodium 142 135 - 145 mmol/L   Potassium 3.8 3.5 - 5.1 mmol/L   Chloride 97 (L) 101 - 111 mmol/L   CO2 36 (H) 22 - 32 mmol/L   Glucose, Bld 133 (H) 65 - 99 mg/dL   BUN 34 (H) 6 - 20 mg/dL   Creatinine, Ser 1.50 (H) 0.61 - 1.24 mg/dL   Calcium 8.6 (L) 8.9 - 10.3 mg/dL   GFR calc non Af Amer 44 (L) >60 mL/min   GFR calc Af Amer 52 (L) >60 mL/min    Comment: (NOTE) The eGFR has been calculated using the CKD EPI equation. This calculation has not been validated in all clinical situations. eGFR's persistently <60 mL/min signify possible Chronic Kidney Disease.    Anion gap 9 5 - 15    Comment: Performed at [facility] [address], [place], [place] [address]  CBC with  Differential/Platelet     Status: Abnormal   Collection Time: 04/23/18  6:05 AM  Result Value Ref Range   WBC 5.5 4.0 - 10.5 K/uL   RBC 3.31 (L) 4.22 - 5.81 MIL/uL   Hemoglobin 9.6 (L) 13.0 - 17.0 g/dL   HCT 31.7 (L) 39.0 - 52.0 %   MCV 95.8 78.0 - 100.0 fL   MCH 29.0 26.0 - 34.0 pg   MCHC 30.3 30.0 - 36.0 g/dL   RDW 16.6 (H) 11.5 - 15.5 %   Platelets 240 150 - 400 K/uL   Neutrophils Relative % 72 %   Neutro Abs 4.0 1.7 - 7.7 K/uL   Lymphocytes Relative 16 %   Lymphs Abs 0.9 0.7 - 4.0 K/uL   Monocytes Relative 7 %   Monocytes Absolute 0.4 0.1 - 1.0 K/uL   Eosinophils Relative 5 %   Eosinophils Absolute 0.3 0.0 - 0.7 K/uL   Basophils Relative 0 %   Basophils Absolute 0.0 0.0 - 0.1 K/uL    Comment: Performed at [facility] [address] [place], [place] [address]  Glucose, capillary     Status: Abnormal   Collection Time: 04/23/18  6:51 AM  Result Value Ref Range  Glucose-Capillary 139 (H) 65 - 99 mg/dL  Glucose, capillary     Status: Abnormal   Collection Time: 04/23/18 12:07 PM  Result Value Ref Range   Glucose-Capillary 163 (H) 65 - 99 mg/dL   Comment 1 Notify RN   Glucose, capillary     Status: Abnormal   Collection Time: 04/23/18  4:29 PM  Result Value Ref Range   Glucose-Capillary 260 (H) 65 - 99 mg/dL   Comment 1 Notify RN   Glucose, capillary     Status: Abnormal   Collection Time: 04/23/18  8:54 PM  Result Value Ref Range   Glucose-Capillary 130 (H) 65 - 99 mg/dL  Glucose, capillary     Status: Abnormal   Collection Time: 04/24/18  6:46 AM  Result Value Ref Range   Glucose-Capillary 107 (H) 65 - 99 mg/dL  Basic metabolic panel     Status: Abnormal   Collection Time: 04/24/18  7:25 AM  Result Value Ref Range   Sodium 140 135 - 145 mmol/L   Potassium 3.7 3.5 - 5.1 mmol/L   Chloride 93 (L) 101 - 111 mmol/L   CO2 38 (H) 22 - 32 mmol/L   Glucose, Bld 121 (H) 65 - 99 mg/dL   BUN 34 (H) 6 - 20 mg/dL   Creatinine, Ser 1.64 (H) 0.61 - 1.24 mg/dL    Calcium 9.0 8.9 - 10.3 mg/dL   GFR calc non Af Amer 40 (L) >60 mL/min   GFR calc Af Amer 46 (L) >60 mL/min    Comment: (NOTE) The eGFR has been calculated using the CKD EPI equation. This calculation has not been validated in all clinical situations. eGFR's persistently <60 mL/min signify possible Chronic Kidney Disease.    Anion gap 9 5 - 15    Comment: Performed at [facility] [address] [place], [place] [address]  Glucose, capillary     Status: Abnormal   Collection Time: 04/24/18 11:26 AM  Result Value Ref Range   Glucose-Capillary 150 (H) 65 - 99 mg/dL  Glucose, capillary     Status: Abnormal   Collection Time: 04/24/18  4:47 PM  Result Value Ref Range   Glucose-Capillary 266 (H) 65 - 99 mg/dL  Glucose, capillary     Status: Abnormal   Collection Time: 04/24/18  9:11 PM  Result Value Ref Range   Glucose-Capillary 141 (H) 65 - 99 mg/dL  Glucose, capillary     Status: Abnormal   Collection Time: 04/25/18  6:12 AM  Result Value Ref Range   Glucose-Capillary 124 (H) 65 - 99 mg/dL    Constitutional: No distress . Vital signs reviewed. HEENT: EOMI, oral membranes moist Neck: supple Cardiovascular: RRR without murmur. No JVD    Respiratory: CTA Bilaterally without wheezes or rales. Normal effort , [facility] GI: BS +, non-tender, non-distended  Musc/Skel:  No edema. No tenderness. Neuro: Alert and Oriented  Motor: 5/5 BUE proximal to distal 3-/5 BLE HF, 4-/5 distally Skin: Intact. Warm and dry.    Assessment/Plan: 1. 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 Physiatrist is providing close team supervision and 24 hour management of active medical problems listed below. Physiatrist and rehab team continue to assess barriers to discharge/monitor patient progress toward functional and medical goals. FIM: Function - Bathing Position: Bed(LB at bed level and UB seated  EOB) Body parts bathed by patient: Right arm, Left arm, Chest, Abdomen, Front perineal area Body  parts bathed by helper: Buttocks, Right upper leg, Left upper leg, Back Bathing not applicable: Left lower leg, Right lower leg(Unna boots) Assist Level: (Mod assist)  Function- Upper Body Dressing/Undressing What is the patient wearing?: Pull over shirt/dress Pull over shirt/dress - Perfomed by patient: Thread/unthread right sleeve, Thread/unthread left sleeve, Put head through opening, Pull shirt over trunk Assist Level: Set up Function - Lower Body Dressing/Undressing What is the patient wearing?: Pants Position: Bed Pants- Performed by helper: Thread/unthread right pants leg, Thread/unthread left pants leg, Pull pants up/down Assist for footwear: Dependant Assist for lower body dressing: (Total assist)  Function - Toileting Toileting activity did not occur: No continent bowel/bladder event Toileting steps completed by helper: Adjust clothing prior to toileting, Performs perineal hygiene, Adjust clothing after toileting Toileting Assistive Devices: Other (comment)(sara lift) Assist level: Two helpers  Function - Air cabin crew transfer activity did not occur: Safety/medical concerns Assist level to toilet: Moderate assist (Pt 50 - 74%/lift or lower)(per NT report) Assist level from toilet: Touching or steadying assistance (Pt > 75%)  Function - Chair/bed transfer Chair/bed transfer method: Lateral scoot Chair/bed transfer assist level: Maximal assist (Pt 25 - 49%/lift and lower) Chair/bed transfer assistive device: Sliding board Mechanical lift: Clarise Cruz Chair/bed transfer details: Verbal cues for sequencing, Verbal cues for technique, Verbal cues for precautions/safety, Verbal cues for safe use of DME/AE, Manual facilitation for weight shifting, Manual facilitation for placement, Manual facilitation for weight bearing  Function - Locomotion: Wheelchair Will patient use  wheelchair at discharge?: Yes Type: Manual Max wheelchair distance: 150 Assist Level: Supervision or verbal cues Wheel 50 feet with 2 turns activity did not occur: Safety/medical concerns Assist Level: Supervision or verbal cues Wheel 150 feet activity did not occur: Safety/medical concerns Assist Level: Supervision or verbal cues Turns around,maneuvers to table,bed, and toilet,negotiates 3% grade,maneuvers on rugs and over doorsills: No Function - Locomotion: Ambulation Ambulation activity did not occur: Safety/medical concerns  Function - Comprehension Comprehension: Auditory Comprehension assist level: Understands complex 90% of the time/cues 10% of the time  Function - Expression Expression: Verbal Expression assist level: Expresses complex 90% of the time/cues < 10% of the time  Function - Social Interaction Social Interaction assist level: Interacts appropriately 90% of the time - Needs monitoring or encouragement for participation or interaction.  Function - Problem Solving Problem solving assist level: Solves basic problems with no assist  Function - Memory Memory assist level: Recognizes or recalls 90% of the time/requires cueing < 10% of the time Patient normally able to recall (first 3 days only): Current season, Staff names and faces, That he or she is in a hospital  Medical Problem List and Plan: 1.  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  DVT Prophylaxis/Anticoagulation: SCDs.      Vascular study negative but unna boots limited study 3. Pain Management: Neurontin 400 mg 3 times daily---controlled 4. Mood: Lexapro 10 mg daily 5. Neuropsych: This patient is not fully capable of making decisions on his own behalf. 6. Skin/Wound Care: Pressure relief measures.  7. Fluids/Electrolytes/Nutrition: Strict I/Os. Heart healthy diet. Monitor voiding function.  8. OSA  with acute hypercarbic respiratory failure: Has to use BIPAP or CPAP at nights 9. Acute on chronic diastolic CHF:  Improved with lasix which has been discontinued and changed to [facility] which has also been stopped.  hydralazine dose adjusted. Monitor weights daily with strict I/Os.       Filed  Weights   04/22/18 0600 04/23/18 0551 04/25/18 0529  Weight: 132 kg (291 lb 0.1 oz) 131.5 kg (289 lb 14.5 oz) 127.5 kg (281 lb 1.4 oz)    Stable on 4/28  Managed by cards, appreciate recs 10 Acute on chronic renal failure.        hydralazine 50 mg 3 times daily   ?Restart ARB this week per Cards--defer to their recommendations   Cr upto 1.85 today. Off all diuretics.   -encourage appropriate fluid intake   -recheck labs tomorrow    11. Morbid obesity: BMI 40.46.  12. Hypoxic encephalopathy:   Resolved    limit sedating medications. Encourage IS. CPAP whenever asleep. 13.  Hypertension.  Lopressor 25 mg twice daily.    Monitor with increased mobility  Overall controlled on 4/28 14.  BPH.  Flomax 0.4 mg daily, Toviaz 8 mg daily.   15.  Diabetes mellitus with peripheral neuropathy.  Hemoglobin A1c 7.7.  SSI.  Check blood sugars before meals and at bedtime   On amaryl at home, resumed. Titrate regimen as needed   Inconsistent control. Follow for pattern before further changes 16.  Fevers.  Resolved. Blood cultures no growth.  Urine culture negative. Completed course of Omnicef 17.  Hypothyroidism.  Synthroid 18.  Constipation.  Laxative assistance, increased Senokot to 2 tablets twice a day, continue MiraLAX 1 packet/day   Dulcolax prn 19. Hypokalemia  Potassium 3.8 on 4/28   Supplemented x1 day on 4/23   Cont to monitor 20. Acute blood loss anemia  Hemoglobin 9.6 on 4/26  Continue to monitor  LOS (Days) 9 A FACE TO FACE EVALUATION WAS PERFORMED  Meredith Staggers 04/25/2018, 8:03 AM

## 2018-04-25 NOTE — Progress Notes (Signed)
Patient places himself on home CPAP when he is ready for bed. RT will monitor as needed.

## 2018-04-26 ENCOUNTER — Inpatient Hospital Stay (HOSPITAL_COMMUNITY): Payer: Medicare Other | Admitting: Occupational Therapy

## 2018-04-26 ENCOUNTER — Inpatient Hospital Stay (HOSPITAL_COMMUNITY): Payer: Medicare Other | Admitting: Physical Therapy

## 2018-04-26 LAB — URINALYSIS, ROUTINE W REFLEX MICROSCOPIC
Bilirubin Urine: NEGATIVE
Glucose, UA: NEGATIVE mg/dL
Ketones, ur: NEGATIVE mg/dL
NITRITE: POSITIVE — AB
Protein, ur: 100 mg/dL — AB
Specific Gravity, Urine: 1.01 (ref 1.005–1.030)
WBC, UA: >50 WBC/hpf — ABNORMAL HIGH (ref 0–5)
pH: 7 (ref 5.0–8.0)

## 2018-04-26 LAB — BASIC METABOLIC PANEL
Anion gap: 5 (ref 5–15)
BUN: 33 mg/dL — AB (ref 6–20)
CALCIUM: 8.9 mg/dL (ref 8.9–10.3)
CHLORIDE: 97 mmol/L — AB (ref 101–111)
CO2: 38 mmol/L — ABNORMAL HIGH (ref 22–32)
CREATININE: 1.55 mg/dL — AB (ref 0.61–1.24)
GFR calc Af Amer: 50 mL/min — ABNORMAL LOW (ref >60)
GFR, EST NON AFRICAN AMERICAN: 43 mL/min — AB (ref >60)
Glucose, Bld: 129 mg/dL — ABNORMAL HIGH (ref 65–99)
Potassium: 3.6 mmol/L (ref 3.5–5.1)
SODIUM: 140 mmol/L (ref 135–145)

## 2018-04-26 LAB — GLUCOSE, CAPILLARY
GLUCOSE-CAPILLARY: 185 mg/dL — AB (ref 65–99)
GLUCOSE-CAPILLARY: 219 mg/dL — AB (ref 65–99)
Glucose-Capillary: 115 mg/dL — ABNORMAL HIGH (ref 65–99)
Glucose-Capillary: 137 mg/dL — ABNORMAL HIGH (ref 65–99)

## 2018-04-26 MED ORDER — TORSEMIDE 20 MG PO TABS
80.0000 mg | ORAL_TABLET | Freq: Two times a day (BID) | ORAL | Status: DC
  Administered 2018-04-26 – 2018-05-12 (×32): 80 mg via ORAL
  Filled 2018-04-26 (×34): qty 4

## 2018-04-26 NOTE — Progress Notes (Signed)
Occupational Therapy Session Note  Patient Details  Name: Travis [name] MRN: [id number] Date of Birth: 22-Jul-1946  Today's Date: 04/26/2018 OT Individual Time: 0705-0800 and 1300-1400 OT Individual Time Calculation (min): 55 min and 60 min    Short Term Goals: Week 2:  OT Short Term Goal 1 (Week 2): Pt will tolerate standing 5 mins in St[place] to complete 2 grooming tasks with increased endurance OT Short Term Goal 2 (Week 2): Pt will complete LB dressing with max assist with use of AE OT Short Term Goal 3 (Week 2): Pt will complete toilet transfer with mod assist with LRAD  Skilled Therapeutic Interventions/Progress Updates:    Session 1: Upon entering the room, pt supine in bed with no c/o pain and agreeable to OT intervention. Pt performed log roll and supine >sit with mod A to EOB. Pt donned brace and therapist set up slide board for transfer. Pt transferred with min A towards the R.  OT assisted pt with donning B TEDs while he ate breakfast. Pt engaged in B LE marching from seated position 3 sets of 10 reps with pt's O2 saturation above 94% on 1.5 L of O2. Focus on pursed lip breathing with min verbal cues for technique. Pt remained seated in wheelchair at end of session with call bell and all needed items within reach.   Session 2: Pt seated in wheelchair upon entering the room. OT assisted pt via wheelchair to day room. Pt standing in STEDY with max A for sit <>stand from wheelchair and elevated surface. Pt standing multiple times of 4 minutes, 5 minutes, and 3 minutes respectively while engaged in leisure card game. Pt needing min A for standing balance with STEDY and needing multiple rest breaks secondary to fatigue. Pt propelled wheelchair 75' back towards room with supervision and increased time. Pt remained in wheelchair with call bell and all needed items within reach.   Therapy Documentation Precautions:  Precautions Precautions: Fall, Back, Cervical Restrictions Weight Bearing  Restrictions: No General:   Vital Signs: Therapy Vitals Temp: 97.8 F (36.6 C) Temp Source: Oral Pulse Rate: 67 Resp: (!) 24 BP: (!) 166/67 Patient Position (if appropriate): Lying Oxygen Therapy SpO2: 96 % O2 Device: Nasal Cannula O2 Flow Rate (L/min): 2 L/min  See Function Navigator for Current Functional Status.   Therapy/Group: Individual Therapy  Gypsy Decant 04/26/2018, 4:40 PM

## 2018-04-26 NOTE — Plan of Care (Signed)
  Problem: SCI BLADDER ELIMINATION Goal: RH STG MANAGE BLADDER WITH ASSISTANCE Description STG Manage Bladder With Assistance. Mod  Outcome: Progressing Flowsheets (Taken 04/26/2018 1440) STG: Pt will manage bladder with assistance: 5-Supervision/set up   Problem: RH SKIN INTEGRITY Goal: RH STG SKIN FREE OF INFECTION/BREAKDOWN Description Remain free of breakdown with mod assist  Outcome: Progressing Goal: RH STG MAINTAIN SKIN INTEGRITY WITH ASSISTANCE Description STG Maintain Skin Integrity With Assistance. Mod  Outcome: Progressing Flowsheets (Taken 04/26/2018 1440) STG: Maintain skin integrity with assistance: 4-Minimal assistance   Problem: SCI BOWEL ELIMINATION Goal: RH STG MANAGE BOWEL WITH ASSISTANCE Description STG Manage Bowel with mod assistance.  Outcome: Progressing Flowsheets (Taken 04/26/2018 1440) STG: Pt will manage bowels with assistance: 3-Moderate assistance Goal: RH STG SCI MANAGE BOWEL WITH MEDICATION WITH ASSISTANCE Description STG SCI Manage bowel with medication with assistance. Outcome: Progressing   Problem: RH SAFETY Goal: RH STG ADHERE TO SAFETY PRECAUTIONS W/ASSISTANCE/DEVICE Description STG Adhere to Safety Precautions With mod Assistance/Device.  Outcome: Progressing Flowsheets (Taken 04/26/2018 1440) STG:Pt will adhere to safety precautions with assistance/device: 3-Moderate assistance   Problem: RH KNOWLEDGE DEFICIT SCI Goal: RH STG INCREASE KNOWLEDGE OF SELF CARE AFTER SCI Description Increase knowledge of self care after SCI with mod assist  Outcome: Progressing   Problem: Consults Goal: RH SPINAL CORD INJURY PATIENT EDUCATION Description  See Patient Education module for education specifics.  Outcome: Progressing

## 2018-04-26 NOTE — Progress Notes (Addendum)
Physical Therapy Session Note  Patient Details  Name: Travis [name] MRN: [id number] Date of Birth: 12-24-1946  Today's Date: 04/26/2018 PT Individual Time: 0900-1015 PT Individual Time Calculation (min): 75 min   Short Term Goals: Week 2:  PT Short Term Goal 1 (Week 2): Pt will perform sit<>stand with mod assist consistently  PT Short Term Goal 2 (Week 2): Pt will ambulate 67f with LRAD and mod assist  PT Short Term Goal 3 (Week 2): Pt propell WC >1541fwithout rest and supervision assist consistently  PT Short Term Goal 4 (Week 2): Pt will perform bed mobility with min assist   Skilled Therapeutic Interventions/Progress Updates: Pt presented in w/c agreeable to therapy. Session focused on LE strengthening and endurance. Pt propelled 3001fupervision for endurance with x 2 brief rest breaks. Ptarticipated in Cybex Kinetron 70cm/sec 2 min x 2 for BLE strengthening. Pt transported to rehab gym and performed SB transfer to mat min guard. Performed sidelying HS curls, hip flexion 2 x 15, hip extension, and clamshells 2 x 10. Pt performed sit to/from sidelying modA from flat surface. Pt returned to w/c via SB in same manner as prior and transported back to room, pt remained in w/c at end of session with call bel within reach and needs met.      Therapy Documentation Precautions:  Precautions Precautions: Fall, Back, Cervical Restrictions Weight Bearing Restrictions: No General:   Vital Signs: Therapy Vitals Temp: 97.8 F (36.6 C) Temp Source: Oral Pulse Rate: 67 Resp: (!) 24 BP: (!) 166/67 Patient Position (if appropriate): Lying Oxygen Therapy SpO2: 96 % O2 Device: Nasal Cannula O2 Flow Rate (L/min): 2 L/min   See Function Navigator for Current Functional Status.   Therapy/Group: Individual Therapy  Shashwat Cleary  Jaiquan Temme, PTA  04/26/2018, 4:11 PM

## 2018-04-26 NOTE — Progress Notes (Addendum)
Progress Note  Patient Name: Travis [name] Date of Encounter: 04/26/2018  Primary Cardiologist: Mertie Moores, MD   Subjective   Reports breathing is stable. Denies chest pain or SOB.   Inpatient Medications    Scheduled Meds: . escitalopram  10 mg Oral Daily  . fesoterodine  8 mg Oral Daily  . gabapentin  400 mg Oral TID  . glimepiride  1 mg Oral Q breakfast  . hydrALAZINE  50 mg Oral Q8H  . insulin aspart  0-15 Units Subcutaneous TID WC  . levothyroxine  175 mcg Oral QAC breakfast  . metoprolol tartrate  25 mg Oral BID  . polyethylene glycol  17 g Oral Daily  . potassium chloride  20 mEq Oral BID  . pramipexole  0.5 mg Oral BID  . senna  2 tablet Oral BID  . tamsulosin  0.4 mg Oral Daily   Continuous Infusions:  PRN Meds: acetaminophen **OR** acetaminophen, bisacodyl, menthol-cetylpyridinium **OR** phenol, ondansetron **OR** ondansetron (ZOFRAN) IV, ondansetron **OR** [DISCONTINUED] ondansetron (ZOFRAN) IV, sorbitol   Vital Signs    Vitals:   04/25/18 1954 04/25/18 2341 04/26/18 0545 04/26/18 0551  BP: (!) 143/56  (!) 149/64 (!) 149/64  Pulse: (!) 59 68 65   Resp:  18    Temp:   98.4 F (36.9 C)   TempSrc:   Oral   SpO2:  95% 95%   Weight:   282 lb 10.1 oz (128.2 kg)   Height:        Intake/Output Summary (Last 24 hours) at 04/26/2018 1103 Last data filed at 04/26/2018 0800 Gross per 24 hour  Intake 600 ml  Output 1300 ml  Net -700 ml   Filed Weights   04/23/18 0551 04/25/18 0529 04/26/18 0545  Weight: 289 lb 14.5 oz (131.5 kg) 281 lb 1.4 oz (127.5 kg) 282 lb 10.1 oz (128.2 kg)    Physical Exam   GEN: Sitting in wheelchair in no acute distress.   Neck: JVD difficult to assess while sitting in wheelchair, no carotid bruits Cardiac: RRR, no murmurs, rubs, or gallops.  Respiratory: mild crackles at bases b/l, no wheezes or rhonchi GI: NABS, obese, soft, nontender, non-distended  MS: 2+ LE edema; No deformity. Neuro:  Nonfocal, moving all  extremities spontaneously Psych: Normal affect   Labs    Chemistry Recent Labs  Lab 04/24/18 0725 04/25/18 0709 04/26/18 0547  NA 140 142 140  K 3.7 3.8 3.6  CL 93* 93* 97*  CO2 38* 40* 38*  GLUCOSE 121* 126* 129*  BUN 34* 37* 33*  CREATININE 1.64* 1.85* 1.55*  CALCIUM 9.0 8.8* 8.9  GFRNONAA 40* 34* 43*  GFRAA 46* 40* 50*  ANIONGAP 9 9 5      Hematology Recent Labs  Lab 04/23/18 0605  WBC 5.5  RBC 3.31*  HGB 9.6*  HCT 31.7*  MCV 95.8  MCH 29.0  MCHC 30.3  RDW 16.6*  PLT 240    Cardiac EnzymesNo results for input(s): TROPONINI in the last 168 hours. No results for input(s): TROPIPOC in the last 168 hours.   BNPNo results for input(s): BNP, PROBNP in the last 168 hours.   DDimer No results for input(s): DDIMER in the last 168 hours.   Radiology    No results found.  Cardiac Studies   Echocardiogram 02/2018: Study Conclusions  - Left ventricle: The cavity size was normal. Wall thickness was   increased in a pattern of mild LVH. Systolic function was   vigorous. The estimated ejection  fraction was in the range of 65%   to 70%. Wall motion was normal; there were no regional wall   motion abnormalities. Doppler parameters are consistent with   pseudonormal left ventricular relaxation (grade 2 diastolic   dysfunction). The E/A ratio is >1.5. The E/e&' ratio is >15,   suggesting elevated LV filling pressure. - Left atrium: The atrium was mildly dilated. - Inferior vena cava: The vessel was normal in size. The   respirophasic diameter changes were in the normal range (>= 50%),   consistent with normal central venous pressure.  Impressions:  - Compared to a prior study in 2016, there is no significant   change. LV filling pressure is elevated.   Patient Profile     72 y.o.malewith chronic diastolic heart failure, OSA, hypertension, diabetes and mild pulmonary hypertension admitted 04/08/18 for back surgery and developed increased shortness of breath  post operatively. Cardiology following for acute on chronic diastolic CHF.  Assessment & Plan    1. Acute on chronic diastolic CHF: Torsemide and metolazone held over the weekend given bump in Cr (1.5>1.85>1.55 today) which is still up from baseline 1.3-1.4. Still requiring O2 via [place]ud[place] (85% on RA today), and still with significant LE edema.  - Will plan to resume torsemide today - Would continue to hold metolazone and home ARB  2. HTN: BP elevated this morning. Torsemide held over the weekend for acute on chronic renal insufficiency. ARB on hold this admission. - Continue metoprolol and hydralazine  3. Acute on chronic renal insufficiency: Cr improved to 1.55 today from 1.85 yesterday. Torsemide held over the weekend given bump in Cr.  - Continue to monitor Cr closely  4. Hypokalemia: K 3.6 this AM. Patient to receive 40 mEq today.  - Continue to monitor K with diuresis and replete as needed for goal >4  For questions or updates, please contact [facility] Please consult [email or web address] for contact info under Cardiology/STEMI.      Signed, Abigail Butts, [name]  04/26/2018, 11:03 AM   [phone]  History and all data above reviewed.  Patient examined.  I agree with the findings as above.  He feels like he is breathing OK but he drops his sats as above.  Still with significant leg edema The patient exam reveals COR:RRR  ,  Lungs: Clear  ,  Abd: Positive bowel sounds, no rebound no guarding,  Ext Moderate leg edema  .  All available labs, radiology testing, previous records reviewed. Agree with documented assessment and plan. ACUTE ON CHRONIC DIASTOLIC HF:  Agree with resuming Torsemide.  Avoid ARB.  Key to getting the fluid off if his legs is keeping his feet elevated.  I discussed this with the patient and his wife.  He was seated in the wheelchair during this exam.  Follow creat closely.    Jeneen Rinks Issabella Rix  11:57 AM  04/26/2018

## 2018-04-26 NOTE — Progress Notes (Signed)
Subjective/Complaints: Pt seen sitting up in his chair this AM working with OT.  He slept well overnight.  He states he had a good weekend.  He has questions about his wound.   ROS: Denies CP, SOB, N/V/D.   Objective: Vital Signs: Blood pressure (!) 149/64, pulse 65, temperature 98.4 F (36.9 C), temperature source Oral, resp. rate 18, height 6' 1"  (1.854 m), weight 128.2 kg (282 lb 10.1 oz), SpO2 95 %. No results found. Results for orders placed or performed during the hospital encounter of 04/16/18 (from the past 72 hour(s))  Glucose, capillary     Status: Abnormal   Collection Time: 04/23/18 12:07 PM  Result Value Ref Range   Glucose-Capillary 163 (H) 65 - 99 mg/dL   Comment 1 Notify RN   Glucose, capillary     Status: Abnormal   Collection Time: 04/23/18  4:29 PM  Result Value Ref Range   Glucose-Capillary 260 (H) 65 - 99 mg/dL   Comment 1 Notify RN   Glucose, capillary     Status: Abnormal   Collection Time: 04/23/18  8:54 PM  Result Value Ref Range   Glucose-Capillary 130 (H) 65 - 99 mg/dL  Glucose, capillary     Status: Abnormal   Collection Time: 04/24/18  6:46 AM  Result Value Ref Range   Glucose-Capillary 107 (H) 65 - 99 mg/dL  Basic metabolic panel     Status: Abnormal   Collection Time: 04/24/18  7:25 AM  Result Value Ref Range   Sodium 140 135 - 145 mmol/L   Potassium 3.7 3.5 - 5.1 mmol/L   Chloride 93 (L) 101 - 111 mmol/L   CO2 38 (H) 22 - 32 mmol/L   Glucose, Bld 121 (H) 65 - 99 mg/dL   BUN 34 (H) 6 - 20 mg/dL   Creatinine, Ser 1.64 (H) 0.61 - 1.24 mg/dL   Calcium 9.0 8.9 - 10.3 mg/dL   GFR calc non Af Amer 40 (L) >60 mL/min   GFR calc Af Amer 46 (L) >60 mL/min    Comment: (NOTE) The eGFR has been calculated using the CKD EPI equation. This calculation has not been validated in all clinical situations. eGFR's persistently <60 mL/min signify possible Chronic Kidney Disease.    Anion gap 9 5 - 15    Comment: Performed at [facility]  [address], [place], [place] [address]  Glucose, capillary     Status: Abnormal   Collection Time: 04/24/18 11:26 AM  Result Value Ref Range   Glucose-Capillary 150 (H) 65 - 99 mg/dL  Glucose, capillary     Status: Abnormal   Collection Time: 04/24/18  4:47 PM  Result Value Ref Range   Glucose-Capillary 266 (H) 65 - 99 mg/dL  Glucose, capillary     Status: Abnormal   Collection Time: 04/24/18  9:11 PM  Result Value Ref Range   Glucose-Capillary 141 (H) 65 - 99 mg/dL  Glucose, capillary     Status: Abnormal   Collection Time: 04/25/18  6:12 AM  Result Value Ref Range   Glucose-Capillary 124 (H) 65 - 99 mg/dL  Basic metabolic panel     Status: Abnormal   Collection Time: 04/25/18  7:09 AM  Result Value Ref Range   Sodium 142 135 - 145 mmol/L   Potassium 3.8 3.5 - 5.1 mmol/L   Chloride 93 (L) 101 - 111 mmol/L   CO2 40 (H) 22 - 32 mmol/L   Glucose, Bld 126 (H) 65 - 99 mg/dL  BUN 37 (H) 6 - 20 mg/dL   Creatinine, Ser 1.85 (H) 0.61 - 1.24 mg/dL   Calcium 8.8 (L) 8.9 - 10.3 mg/dL   GFR calc non Af Amer 34 (L) >60 mL/min   GFR calc Af Amer 40 (L) >60 mL/min    Comment: (NOTE) The eGFR has been calculated using the CKD EPI equation. This calculation has not been validated in all clinical situations. eGFR's persistently <60 mL/min signify possible Chronic Kidney Disease.    Anion gap 9 5 - 15    Comment: Performed at [facility] [name], [place], [place] [address]  Glucose, capillary     Status: Abnormal   Collection Time: 04/25/18 11:40 AM  Result Value Ref Range   Glucose-Capillary 156 (H) 65 - 99 mg/dL  Glucose, capillary     Status: Abnormal   Collection Time: 04/25/18  4:58 PM  Result Value Ref Range   Glucose-Capillary 213 (H) 65 - 99 mg/dL  Glucose, capillary     Status: Abnormal   Collection Time: 04/25/18  9:03 PM  Result Value Ref Range   Glucose-Capillary 144 (H) 65 - 99 mg/dL  Basic metabolic panel     Status: Abnormal   Collection Time: 04/26/18  5:47 AM   Result Value Ref Range   Sodium 140 135 - 145 mmol/L   Potassium 3.6 3.5 - 5.1 mmol/L   Chloride 97 (L) 101 - 111 mmol/L   CO2 38 (H) 22 - 32 mmol/L   Glucose, Bld 129 (H) 65 - 99 mg/dL   BUN 33 (H) 6 - 20 mg/dL   Creatinine, Ser 1.55 (H) 0.61 - 1.24 mg/dL   Calcium 8.9 8.9 - 10.3 mg/dL   GFR calc non Af Amer 43 (L) >60 mL/min   GFR calc Af Amer 50 (L) >60 mL/min    Comment: (NOTE) The eGFR has been calculated using the CKD EPI equation. This calculation has not been validated in all clinical situations. eGFR's persistently <60 mL/min signify possible Chronic Kidney Disease.    Anion gap 5 5 - 15    Comment: Performed at [facility] [address] [place], [place] [address]  Glucose, capillary     Status: Abnormal   Collection Time: 04/26/18  6:41 AM  Result Value Ref Range   Glucose-Capillary 115 (H) 65 - 99 mg/dL    Constitutional: No distress. Vital signs reviewed. HENT: Normocephalic, atraumatic.  Eyes: EOMI. No discharge.  Cardiovascular: RRR. No JVD    Respiratory: CTA Bilaterally. Normal effort GI: BS +, non-distended  Musc/Skel:  LE edema. No tenderness. Neuro: Alert and Oriented  Motor: 5/5 BUE proximal to distal 3/5 BLE HF, 4-/5 distally Skin: ulcer on left distal tibia  Assessment/Plan: 1. 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 Physiatrist is providing close team supervision and 24 hour management of active medical problems listed below. Physiatrist and rehab team continue to assess barriers to discharge/monitor patient progress toward functional and medical goals. FIM: Function - Bathing Position: Bed(LB at bed level and UB seated EOB) Body parts bathed by patient: Right arm, Left arm, Chest, Abdomen, Front perineal area Body parts bathed by helper: Buttocks, Right upper leg, Left upper leg, Back Bathing not applicable: Left lower leg, Right  lower leg(Unna boots) Assist Level: (Mod assist)  Function- Upper Body Dressing/Undressing What is the patient wearing?: Pull over shirt/dress Pull over shirt/dress - Perfomed by patient:  Thread/unthread right sleeve, Thread/unthread left sleeve, Put head through opening, Pull shirt over trunk Assist Level: Set up Function - Lower Body Dressing/Undressing What is the patient wearing?: Pants Position: Bed Pants- Performed by helper: Thread/unthread right pants leg, Thread/unthread left pants leg, Pull pants up/down Assist for footwear: Dependant Assist for lower body dressing: (Total assist)  Function - Toileting Toileting activity did not occur: No continent bowel/bladder event Toileting steps completed by helper: Adjust clothing prior to toileting, Performs perineal hygiene, Adjust clothing after toileting Toileting Assistive Devices: Other (comment)(sara lift) Assist level: Two helpers  Function - Air cabin crew transfer activity did not occur: Safety/medical concerns Assist level to toilet: Moderate assist (Pt 50 - 74%/lift or lower)(per NT report) Assist level from toilet: Touching or steadying assistance (Pt > 75%)  Function - Chair/bed transfer Chair/bed transfer method: Lateral scoot Chair/bed transfer assist level: Maximal assist (Pt 25 - 49%/lift and lower) Chair/bed transfer assistive device: Sliding board Mechanical lift: Clarise Cruz Chair/bed transfer details: Verbal cues for sequencing, Verbal cues for technique, Verbal cues for precautions/safety, Verbal cues for safe use of DME/AE, Manual facilitation for weight shifting, Manual facilitation for placement, Manual facilitation for weight bearing  Function - Locomotion: Wheelchair Will patient use wheelchair at discharge?: Yes Type: Manual Max wheelchair distance: 150 Assist Level: Supervision or verbal cues Wheel 50 feet with 2 turns activity did not occur: Safety/medical concerns Assist Level: Supervision or  verbal cues Wheel 150 feet activity did not occur: Safety/medical concerns Assist Level: Supervision or verbal cues Turns around,maneuvers to table,bed, and toilet,negotiates 3% grade,maneuvers on rugs and over doorsills: No Function - Locomotion: Ambulation Ambulation activity did not occur: Safety/medical concerns  Function - Comprehension Comprehension: Auditory Comprehension assist level: Understands complex 90% of the time/cues 10% of the time  Function - Expression Expression: Verbal Expression assist level: Expresses complex 90% of the time/cues < 10% of the time  Function - Social Interaction Social Interaction assist level: Interacts appropriately 90% of the time - Needs monitoring or encouragement for participation or interaction.  Function - Problem Solving Problem solving assist level: Solves basic problems with no assist  Function - Memory Memory assist level: Recognizes or recalls 90% of the time/requires cueing < 10% of the time Patient normally able to recall (first 3 days only): Current season, Staff names and faces, That he or she is in a hospital  Medical Problem List and Plan: 1.  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  DVT Prophylaxis/Anticoagulation: SCDs.      Vascular study negative but unna boots limited study 3. Pain Management: Neurontin 400 mg 3 times daily 4. Mood: Lexapro 10 mg daily 5. Neuropsych: This patient is capable of making decisions on his own behalf. 6. Skin/Wound Care: Pressure relief measures.  7. Fluids/Electrolytes/Nutrition: Strict I/Os. Heart healthy diet. Monitor voiding function.  8. OSA with acute hypercarbic respiratory failure: Has to use BIPAP or CPAP at nights 9. Acute on chronic diastolic CHF:  Improved with lasix which has been discontinued and changed to [facility] which has also been stopped.   Hydralazine dose adjusted. Monitor weights daily with strict I/Os.       Filed Weights   04/23/18 0551 04/25/18 0529 04/26/18 0545  Weight: 131.5 kg (289 lb 14.5 oz) 127.5 kg (281 lb 1.4 oz) 128.2 kg (282 lb 10.1 oz)    Stable on 4/29  Managed by cards, appreciate recs 10 Acute  on chronic renal failure.    Hydralazine 50 mg 3 times daily   ?Restart ARB per Cards   Cr 1.55 on 4/29  Encourage appropriate fluid intake 11. Morbid obesity: BMI 40.46.  12. Hypoxic encephalopathy:   Resolved    limit sedating medications. Encourage IS. CPAP whenever asleep. 13.  Hypertension.  Lopressor 25 mg twice daily.    Monitor with increased mobility  Slightly labile, but overall controlled on 4/29 14.  BPH.  Flomax 0.4 mg daily, Toviaz 8 mg daily.   15.  Diabetes mellitus with peripheral neuropathy.  Hemoglobin A1c 7.7.  SSI.  Check blood sugars before meals and at bedtime   On amaryl at home, resumed. Titrate regimen as needed   Relatively controlled on 4/29 16.  Fevers.  Resolved. Blood cultures no growth.  Urine culture negative. Completed course of Omnicef 17.  Hypothyroidism.  Synthroid 18.  Constipation.  Laxative assistance, increased Senokot to 2 tablets twice a day, continue MiraLAX 1 packet/day   Dulcolax prn 19. Hypokalemia  Potassium 3.6 on 4/29  Supplemented x1 day on 4/23   Cont to monitor 20. Acute blood loss anemia  Hemoglobin 9.6 on 4/26  Continue to monitor  LOS (Days) 10 A FACE TO FACE EVALUATION WAS PERFORMED  Travis [name] Lorie Phenix 04/26/2018, 8:27 AM

## 2018-04-27 ENCOUNTER — Inpatient Hospital Stay (HOSPITAL_COMMUNITY): Payer: Medicare Other | Admitting: Occupational Therapy

## 2018-04-27 ENCOUNTER — Inpatient Hospital Stay (HOSPITAL_COMMUNITY): Payer: Medicare Other

## 2018-04-27 ENCOUNTER — Inpatient Hospital Stay (HOSPITAL_COMMUNITY): Payer: Medicare Other | Admitting: Physical Therapy

## 2018-04-27 DIAGNOSIS — N390 Urinary tract infection, site not specified: Secondary | ICD-10-CM

## 2018-04-27 LAB — BASIC METABOLIC PANEL
Anion gap: 11 (ref 5–15)
BUN: 29 mg/dL — AB (ref 6–20)
CHLORIDE: 95 mmol/L — AB (ref 101–111)
CO2: 37 mmol/L — ABNORMAL HIGH (ref 22–32)
CREATININE: 1.6 mg/dL — AB (ref 0.61–1.24)
Calcium: 9.2 mg/dL (ref 8.9–10.3)
GFR calc Af Amer: 48 mL/min — ABNORMAL LOW (ref >60)
GFR, EST NON AFRICAN AMERICAN: 41 mL/min — AB (ref >60)
GLUCOSE: 122 mg/dL — AB (ref 65–99)
Potassium: 3.8 mmol/L (ref 3.5–5.1)
SODIUM: 143 mmol/L (ref 135–145)

## 2018-04-27 LAB — GLUCOSE, CAPILLARY
GLUCOSE-CAPILLARY: 138 mg/dL — AB (ref 65–99)
Glucose-Capillary: 142 mg/dL — ABNORMAL HIGH (ref 65–99)
Glucose-Capillary: 148 mg/dL — ABNORMAL HIGH (ref 65–99)
Glucose-Capillary: 223 mg/dL — ABNORMAL HIGH (ref 65–99)

## 2018-04-27 MED ORDER — NITROFURANTOIN MONOHYD MACRO 100 MG PO CAPS
100.0000 mg | ORAL_CAPSULE | Freq: Two times a day (BID) | ORAL | Status: DC
  Administered 2018-04-27 (×2): 100 mg via ORAL
  Filled 2018-04-27 (×3): qty 1

## 2018-04-27 NOTE — Progress Notes (Signed)
Pt wearing home CPAP unit at this time.  No issues noted.  Pt tolerating well.

## 2018-04-27 NOTE — Progress Notes (Signed)
Subjective/Complaints: Pt seen sitting up in bed this AM.  He slept well overnight.  He complains of penile discharge yesterday that self-resolved.   ROS: Denies CP, SOB, N/V/D.   Objective: Vital Signs: Blood pressure (!) 160/70, pulse 68, temperature 97.8 F (36.6 C), temperature source Oral, resp. rate (!) 24, height '6\' 1"'$  (1.854 m), weight 128.4 kg (283 lb), SpO2 96 %. No results found. Results for orders placed or performed during the hospital encounter of 04/16/18 (from the past 72 hour(s))  Glucose, capillary     Status: Abnormal   Collection Time: 04/24/18 11:26 AM  Result Value Ref Range   Glucose-Capillary 150 (H) 65 - 99 mg/dL  Glucose, capillary     Status: Abnormal   Collection Time: 04/24/18  4:47 PM  Result Value Ref Range   Glucose-Capillary 266 (H) 65 - 99 mg/dL  Glucose, capillary     Status: Abnormal   Collection Time: 04/24/18  9:11 PM  Result Value Ref Range   Glucose-Capillary 141 (H) 65 - 99 mg/dL  Glucose, capillary     Status: Abnormal   Collection Time: 04/25/18  6:12 AM  Result Value Ref Range   Glucose-Capillary 124 (H) 65 - 99 mg/dL  Basic metabolic panel     Status: Abnormal   Collection Time: 04/25/18  7:09 AM  Result Value Ref Range   Sodium 142 135 - 145 mmol/L   Potassium 3.8 3.5 - 5.1 mmol/L   Chloride 93 (L) 101 - 111 mmol/L   CO2 40 (H) 22 - 32 mmol/L   Glucose, Bld 126 (H) 65 - 99 mg/dL   BUN 37 (H) 6 - 20 mg/dL   Creatinine, Ser 1.85 (H) 0.61 - 1.24 mg/dL   Calcium 8.8 (L) 8.9 - 10.3 mg/dL   GFR calc non Af Amer 34 (L) >60 mL/min   GFR calc Af Amer 40 (L) >60 mL/min    Comment: (NOTE) The eGFR has been calculated using the CKD EPI equation. This calculation has not been validated in all clinical situations. eGFR's persistently <60 mL/min signify possible Chronic Kidney Disease.    Anion gap 9 5 - 15    Comment: Performed at [facility] [address], [place], [place] [address]  Glucose, capillary     Status: Abnormal   Collection Time: 04/25/18 11:40 AM  Result Value Ref Range   Glucose-Capillary 156 (H) 65 - 99 mg/dL  Glucose, capillary     Status: Abnormal   Collection Time: 04/25/18  4:58 PM  Result Value Ref Range   Glucose-Capillary 213 (H) 65 - 99 mg/dL  Glucose, capillary     Status: Abnormal   Collection Time: 04/25/18  9:03 PM  Result Value Ref Range   Glucose-Capillary 144 (H) 65 - 99 mg/dL  Basic metabolic panel     Status: Abnormal   Collection Time: 04/26/18  5:47 AM  Result Value Ref Range   Sodium 140 135 - 145 mmol/L   Potassium 3.6 3.5 - 5.1 mmol/L   Chloride 97 (L) 101 - 111 mmol/L   CO2 38 (H) 22 - 32 mmol/L   Glucose, Bld 129 (H) 65 - 99 mg/dL   BUN 33 (H) 6 - 20 mg/dL   Creatinine, Ser 1.55 (H) 0.61 - 1.24 mg/dL   Calcium 8.9 8.9 - 10.3 mg/dL   GFR calc non Af Amer 43 (L) >60 mL/min   GFR calc Af Amer 50 (L) >60 mL/min    Comment: (NOTE) The eGFR has  been calculated using the CKD EPI equation. This calculation has not been validated in all clinical situations. eGFR's persistently <60 mL/min signify possible Chronic Kidney Disease.    Anion gap 5 5 - 15    Comment: Performed at [facility] [address], [place], [place] [address]  Glucose, capillary     Status: Abnormal   Collection Time: 04/26/18  6:41 AM  Result Value Ref Range   Glucose-Capillary 115 (H) 65 - 99 mg/dL  Glucose, capillary     Status: Abnormal   Collection Time: 04/26/18 12:00 PM  Result Value Ref Range   Glucose-Capillary 185 (H) 65 - 99 mg/dL  Glucose, capillary     Status: Abnormal   Collection Time: 04/26/18  4:53 PM  Result Value Ref Range   Glucose-Capillary 137 (H) 65 - 99 mg/dL  Urinalysis, Routine w reflex microscopic     Status: Abnormal   Collection Time: 04/26/18  8:34 PM  Result Value Ref Range   Color, Urine YELLOW YELLOW   APPearance CLOUDY (A) CLEAR   Specific Gravity, Urine 1.010 1.005 - 1.030   pH 7.0 5.0 - 8.0   Glucose, UA NEGATIVE NEGATIVE mg/dL   Hgb urine  dipstick MODERATE (A) NEGATIVE   Bilirubin Urine NEGATIVE NEGATIVE   Ketones, ur NEGATIVE NEGATIVE mg/dL   Protein, ur 100 (A) NEGATIVE mg/dL   Nitrite POSITIVE (A) NEGATIVE   Leukocytes, UA LARGE (A) NEGATIVE   RBC / HPF 21-50 0 - 5 RBC/hpf   WBC, UA >50 (H) 0 - 5 WBC/hpf   Bacteria, UA FEW (A) NONE SEEN   Squamous Epithelial / LPF 0-5 0 - 5    Comment: Please note change in reference range.   WBC Clumps PRESENT    Hyaline Casts, UA PRESENT     Comment: Performed at [facility] [address], [place], [place] [address]  Glucose, capillary     Status: Abnormal   Collection Time: 04/26/18 10:00 PM  Result Value Ref Range   Glucose-Capillary 219 (H) 65 - 99 mg/dL   Comment 1 Notify RN   Glucose, capillary     Status: Abnormal   Collection Time: 04/27/18  6:33 AM  Result Value Ref Range   Glucose-Capillary 138 (H) 65 - 99 mg/dL    Constitutional: No distress . Vital signs reviewed. HENT: Normocephalic.  Atraumatic. Eyes: EOMI. No discharge. Cardiovascular: RRR. No JVD. Respiratory: CTA Bilaterally. Normal effort. GI: BS +. Non-distended. Musc/Skel:  LE edema. No tenderness. Neuro: Alert and Oriented  Motor: 5/5 BUE proximal to distal 3/5 BLE HF, 4-/5 distally (stable) Skin: ulcer on left distal tibia  Assessment/Plan: 1. 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 Physiatrist is providing close team supervision and 24 hour management of active medical problems listed below. Physiatrist and rehab team continue to assess barriers to discharge/monitor patient progress toward functional and medical goals. FIM: Function - Bathing Position: Bed(LB at bed level and UB seated EOB) Body parts bathed by patient: Right arm, Left arm, Chest, Abdomen, Front perineal area Body parts bathed by helper: Buttocks, Right upper leg, Left upper leg, Back Bathing not applicable: Left  lower leg, Right lower leg(Unna boots) Assist Level: (Mod assist)  Function- Upper Body Dressing/Undressing What is the patient wearing?: Pull over shirt/dress Pull over shirt/dress - Perfomed by patient: Thread/unthread right sleeve, Thread/unthread left sleeve, Put head through opening, Pull shirt over trunk Assist Level:  Set up Function - Lower Body Dressing/Undressing What is the patient wearing?: Pants Position: Bed Pants- Performed by helper: Thread/unthread right pants leg, Thread/unthread left pants leg, Pull pants up/down Assist for footwear: Dependant Assist for lower body dressing: (Total assist)  Function - Toileting Toileting activity did not occur: No continent bowel/bladder event Toileting steps completed by helper: Adjust clothing prior to toileting, Performs perineal hygiene, Adjust clothing after toileting Toileting Assistive Devices: Other (comment)(sara lift) Assist level: Two helpers  Function - Air cabin crew transfer activity did not occur: Safety/medical concerns Assist level to toilet: Moderate assist (Pt 50 - 74%/lift or lower) Assist level from toilet: Moderate assist (Pt 50 - 74%/lift or lower)  Function - Chair/bed transfer Chair/bed transfer method: Other Chair/bed transfer assist level: 2 helpers Chair/bed transfer assistive device: Mechanical lift Mechanical lift: Clarise Cruz Chair/bed transfer details: Verbal cues for safe use of DME/AE, Manual facilitation for placement, Manual facilitation for weight shifting, Verbal cues for precautions/safety  Function - Locomotion: Wheelchair Will patient use wheelchair at discharge?: Yes Type: Manual Max wheelchair distance: 150 Assist Level: Supervision or verbal cues Wheel 50 feet with 2 turns activity did not occur: Safety/medical concerns Assist Level: Supervision or verbal cues Wheel 150 feet activity did not occur: Safety/medical concerns Assist Level: Supervision or verbal cues Turns  around,maneuvers to table,bed, and toilet,negotiates 3% grade,maneuvers on rugs and over doorsills: No Function - Locomotion: Ambulation Ambulation activity did not occur: [name]  Function - Comprehension Comprehension: Auditory Comprehension assist level: Follows basic conversation/direction with extra time/assistive device  Function - Expression Expression: Verbal Expression assist level: Expresses basic 90% of the time/requires cueing < 10% of the time.  Function - Social Interaction Social Interaction assist level: Interacts appropriately 90% of the time - Needs monitoring or encouragement for participation or interaction.  Function - Problem Solving Problem solving assist level: Solves basic problems with no assist  Function - Memory Memory assist level: Recognizes or recalls 90% of the time/requires cueing < 10% of the time Patient normally able to recall (first 3 days only): Current season, Location of own room, Staff names and faces, That he or she is in a hospital  Medical Problem List and Plan: 1.  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  DVT Prophylaxis/Anticoagulation: SCDs.      Vascular study negative but unna boots limited study 3. Pain Management: Neurontin 400 mg 3 times daily 4. Mood: Lexapro 10 mg daily 5. Neuropsych: This patient is capable of making decisions on his own behalf. 6. Skin/Wound Care: Pressure relief measures.  7. Fluids/Electrolytes/Nutrition: Strict I/Os. Heart healthy diet. Monitor voiding function.  8. OSA with acute hypercarbic respiratory failure: Has to use BIPAP or CPAP at nights 9. Acute on chronic diastolic CHF:  Improved with lasix which has been discontinued and changed to [facility] which has also been stopped.  Hydralazine dose adjusted. Monitor weights daily with strict I/Os.       Filed Weights   04/25/18 0529 04/26/18 0545  04/27/18 0300  Weight: 127.5 kg (281 lb 1.4 oz) 128.2 kg (282 lb 10.1 oz) 128.4 kg (283 lb)    Stable on 4/30  Managed by cards, appreciate recs 10 Acute on chronic renal failure.    Hydralazine 50 mg 3 times daily   ?Restart ARB per Cards   Cr 1.55 on 4/29  Labs pending for today  Encourage appropriate fluid intake 11. Morbid obesity: BMI 40.46.  12. Hypoxic  encephalopathy:   Resolved    limit sedating medications. Encourage IS. CPAP whenever asleep. 13.  Hypertension.  Lopressor 25 mg twice daily.    Monitor with increased mobility  Labile, await Cards plans for resuming ARB 14.  BPH.  Flomax 0.4 mg daily, Toviaz 8 mg daily.   15.  Diabetes mellitus with peripheral neuropathy.  Hemoglobin A1c 7.7.  SSI.  Check blood sugars before meals and at bedtime   On amaryl at home, resumed. Titrate regimen as needed   Labile on 4/30 16.  Fevers.  Resolved. Blood cultures no growth.  Urine culture negative. Completed course of Omnicef 17.  Hypothyroidism.  Synthroid 18.  Constipation.  Laxative assistance, increased Senokot to 2 tablets twice a day, continue MiraLAX 1 packet/day   Dulcolax prn 19. Hypokalemia  Potassium 3.6 on 4/29  Supplemented x1 day on 4/23   Cont to monitor 20. Acute blood loss anemia  Hemoglobin 9.6 on 4/26  Continue to monitor 21. Acute lower UTI  UA+, Ucx pending  Empiric Macrobid started on 4/30  LOS (Days) 11 A FACE TO FACE EVALUATION WAS PERFORMED  Travis [name] Lorie Phenix 04/27/2018, 7:48 AM

## 2018-04-27 NOTE — Plan of Care (Signed)
  Problem: SCI BLADDER ELIMINATION Goal: RH STG MANAGE BLADDER WITH ASSISTANCE Description STG Manage Bladder With Assistance. Mod  Outcome: Progressing   Problem: RH SKIN INTEGRITY Goal: RH STG SKIN FREE OF INFECTION/BREAKDOWN Description Remain free of breakdown with mod assist  Outcome: Progressing Goal: RH STG MAINTAIN SKIN INTEGRITY WITH ASSISTANCE Description STG Maintain Skin Integrity With Assistance. Mod  Outcome: Progressing   Problem: SCI BOWEL ELIMINATION Goal: RH STG MANAGE BOWEL WITH ASSISTANCE Description STG Manage Bowel with mod assistance.  Outcome: Progressing Goal: RH STG SCI MANAGE BOWEL WITH MEDICATION WITH ASSISTANCE Description STG SCI Manage bowel with medication with assistance. Outcome: Progressing   Problem: RH SAFETY Goal: RH STG ADHERE TO SAFETY PRECAUTIONS W/ASSISTANCE/DEVICE Description STG Adhere to Safety Precautions With mod Assistance/Device.  Outcome: Progressing   Problem: RH KNOWLEDGE DEFICIT SCI Goal: RH STG INCREASE KNOWLEDGE OF SELF CARE AFTER SCI Description Increase knowledge of self care after SCI with mod assist  Outcome: Progressing   Problem: Consults Goal: RH SPINAL CORD INJURY PATIENT EDUCATION Description  See Patient Education module for education specifics.  Outcome: Progressing

## 2018-04-27 NOTE — Progress Notes (Signed)
Physical Therapy Session Note  Patient Details  Name: Travis [name] MRN: [id number] Date of Birth: 02/03/1946  Today's Date: 04/27/2018 PT Individual Time: 0835-0905 PT Individual Time Calculation (min): 30 min   Short Term Goals: Week 2:  PT Short Term Goal 1 (Week 2): Pt will perform sit<>stand with mod assist consistently  PT Short Term Goal 2 (Week 2): Pt will ambulate 69ft with LRAD and mod assist  PT Short Term Goal 3 (Week 2): Pt propell WC >154ft without rest and supervision assist consistently  PT Short Term Goal 4 (Week 2): Pt will perform bed mobility with min assist   Skilled Therapeutic Interventions/Progress Updates:    Session focused on w/c propulsion for functional UE strengthening and muscular endurance at supervision level, sit <> stands in paralllel bars with max assist (verbal and tactile cues for anterior weightshift and trunk/hip extension), and gait training in parallel bars with min assist for balance and facilitation for weightshift x 16' total (4' forwards and 4' backwards x 2 in a row). Pt maintained on 2L O2 via [place] throughout activity.  Therapy Documentation Precautions:  Precautions Precautions: Fall, Back, Cervical Restrictions Weight Bearing Restrictions: No Pain:  denies pain.   See Function Navigator for Current Functional Status.   Therapy/Group: Individual Therapy  Canary Brim Ivory Broad, PT, DPT  04/27/2018, 9:11 AM

## 2018-04-27 NOTE — Progress Notes (Signed)
Physical Therapy Session Note  Patient Details  Name: Travis [name] MRN: [id number] Date of Birth: 04/18/1946  Today's Date: 04/27/2018 PT Individual Time: 0930-1045 PT Individual Time Calculation (min): 75 min   Short Term Goals: Week 2:  PT Short Term Goal 1 (Week 2): Pt will perform sit<>stand with mod assist consistently  PT Short Term Goal 2 (Week 2): Pt will ambulate 15ft with LRAD and mod assist  PT Short Term Goal 3 (Week 2): Pt propell WC >150ft without rest and supervision assist consistently  PT Short Term Goal 4 (Week 2): Pt will perform bed mobility with min assist   Skilled Therapeutic Interventions/Progress Updates: Pt presented in w/c agreeable to therapy. Per nsg cardiology removed supplemental O2 and to monitor throughout session. Pt propelled w/c to rehab gym with x 1 rest break on RA. SpO2 desat to 84% and was able to recover to 88% with cues for PLB and within 1 min. Performed SB transfer to mat and performed sit to supine with modA. Pt then indicated urgency for void. Returned to sitting mod/maxA due to urgency and performed SB transfer to w/c with min guard and with PTA set up for SB. Pt returned to room due to incontinent void. Performed sit to stand with Stedy with modA Pt able to maintain standing tolerance while PTA performed peri-care and changed brief. In semi-sitting pt able to lift feet off footplate to allow PTA to doff and don shorts. SpO2 checked after activity with desat to 84% with recovery to 88% within 30 sec. Pt transported back to rehab gym for time management and performed SB transfer to mat in same manner as prior. Pt returned to sidelying and initiated therex on RLE. SpO2 checked during therex with desat to 79%, 1L supplemental O2 via [place] placed with slow recoevery to 86%, increased to 2L with recovery to 90%. Pt able to complete therex on 1L supplemental O2 maintaining 88-90%. Pt returned to w/c via SB and propelled back to room on 1L with SpO2 maintaining  between 88-91%. Pt remained in w/c with call bell within reach and needs met.      Therapy Documentation Precautions:  Precautions Precautions: Fall, Back, Cervical Restrictions Weight Bearing Restrictions: No General:   Vital Signs:  Pain: Pain Assessment Pain Scale: 0-10 Pain Score: 0-No pain   See Function Navigator for Current Functional Status.   Therapy/Group: Individual Therapy      , PTA  04/27/2018, 12:20 PM  

## 2018-04-27 NOTE — Progress Notes (Signed)
Patient has home CPAP unit at bedside. Able to place himself on/off as needed.No assistance needed from RT at this time.

## 2018-04-27 NOTE — Progress Notes (Signed)
Occupational Therapy Session Note  Patient Details  Name: Travis [name] MRN: [id number] Date of Birth: 1946-05-30  Today's Date: 04/27/2018 OT Individual Time: 5053-9767 and 1345-1430 OT Individual Time Calculation (min): 60 min and 45 min   Short Term Goals: Week 2:  OT Short Term Goal 1 (Week 2): Pt will tolerate standing 5 mins in Stedy to complete 2 grooming tasks with increased endurance OT Short Term Goal 2 (Week 2): Pt will complete LB dressing with max assist with use of AE OT Short Term Goal 3 (Week 2): Pt will complete toilet transfer with mod assist with LRAD  Skilled Therapeutic Interventions/Progress Updates:    1) Treatment session with focus on functional transfers and dynamic sitting balance.  Pt received supine in bed finishing breakfast.  Required mod-max assist for bed mobility to ensure log rolling technique to maintain back precautions during rolling and bed mobility.  Therapist donned TEDS and non slip socks and assisted pt with donning back brace while seated EOB.  Therapist placed slide board and provided min assist with slide board transfer to w/c.  Grooming tasks completed in sitting with increased time.  Pt propelled w/c 100 feet with supervision.  Engaged in transfer training w/c <> therapy mat with focus on proper placement of slide board and trunk control/weight shift during transfers.  Engaged in table top activity between transfers with focus on maintaining back precautions during reaching activity.  Discussed safety plan with recommendation for use of slide board bed <> w/c with all staff but to continue to use Stedy for toileting until cleared with therapy.  2) Treatment session with focus on toilet transfers and clothing management with toileting.  Pt received upright in w/c reporting fatigue and requesting to stay in room for session.  Engaged in toilet transfers to drop arm BSC with use of slide board.  Therapist placed slide board for appropriate positioning  and pt required increased time for transfers due to fatigue and slowed initiation this session (pt notified therapist of UTI).  Engaged in lateral leans to simulate clothing management with pt unable to achieve enough clearance of buttocks to allow for repositioning of clothing for toileting, therefore continue to recommend use of Stedy for sit > stand and toilet transfers at this time until standing or lateral leans improves.  Therapy Documentation Precautions:  Precautions Precautions: Fall, Back, Cervical Restrictions Weight Bearing Restrictions: No General:   Vital Signs: Therapy Vitals Pulse Rate: 64 BP: (!) 144/64 Pain:  Pt with no c/o pain  See Function Navigator for Current Functional Status.   Therapy/Group: Individual Therapy  [name] 04/27/2018, 8:38 AM

## 2018-04-27 NOTE — Progress Notes (Addendum)
Progress Note  Patient Name: Travis [name] Date of Encounter: 04/27/2018  Primary Cardiologist: Mertie Moores, MD   Subjective   Reports feeling well this morning. Breathing is stable, no CP or palpitations. Did have some penile discharge yesterday for which a urine sample was collected.   Inpatient Medications    Scheduled Meds: . escitalopram  10 mg Oral Daily  . fesoterodine  8 mg Oral Daily  . gabapentin  400 mg Oral TID  . glimepiride  1 mg Oral Q breakfast  . hydrALAZINE  50 mg Oral Q8H  . insulin aspart  0-15 Units Subcutaneous TID WC  . levothyroxine  175 mcg Oral QAC breakfast  . metoprolol tartrate  25 mg Oral BID  . nitrofurantoin (macrocrystal-monohydrate)  100 mg Oral Q12H  . polyethylene glycol  17 g Oral Daily  . potassium chloride  20 mEq Oral BID  . pramipexole  0.5 mg Oral BID  . senna  2 tablet Oral BID  . tamsulosin  0.4 mg Oral Daily  . torsemide  80 mg Oral BID   Continuous Infusions:  PRN Meds: acetaminophen **OR** acetaminophen, bisacodyl, menthol-cetylpyridinium **OR** phenol, ondansetron **OR** ondansetron (ZOFRAN) IV, ondansetron **OR** [DISCONTINUED] ondansetron (ZOFRAN) IV, sorbitol   Vital Signs    Vitals:   04/26/18 1950 04/27/18 0300 04/27/18 0642 04/27/18 0747  BP: (!) 164/70  (!) 160/70 (!) 144/64  Pulse: 68   64  Resp:      Temp:      TempSrc:      SpO2: 96%     Weight:  283 lb (128.4 kg)    Height:        Intake/Output Summary (Last 24 hours) at 04/27/2018 0929 Last data filed at 04/27/2018 0430 Gross per 24 hour  Intake 530 ml  Output 625 ml  Net -95 ml   Filed Weights   04/25/18 0529 04/26/18 0545 04/27/18 0300  Weight: 281 lb 1.4 oz (127.5 kg) 282 lb 10.1 oz (128.2 kg) 283 lb (128.4 kg)    Physical Exam   GEN: Sitting in wheelchair at bedside in no acute distress. Reports having just finished his 2nd session of PT this morning Neck: no JVD, no carotid bruits Cardiac:  RRR, no murmurs, rubs, or gallops.    Respiratory: Clear to auscultation bilaterally, no wheezes/ rales/ rhonchi; O2 sat improved to 89-90% on RA this morning GI: NABS, obese soft, nontender, non-distended  MS: 1-2+ edema (compression stockings in place); No deformity. Neuro:  Nonfocal, moving all extremities spontaneously Psych: Normal affect   Labs    Chemistry Recent Labs  Lab 04/25/18 0709 04/26/18 0547 04/27/18 0720  NA 142 140 143  K 3.8 3.6 3.8  CL 93* 97* 95*  CO2 40* 38* 37*  GLUCOSE 126* 129* 122*  BUN 37* 33* 29*  CREATININE 1.85* 1.55* 1.60*  CALCIUM 8.8* 8.9 9.2  GFRNONAA 34* 43* 41*  GFRAA 40* 50* 48*  ANIONGAP 9 5 11      Hematology Recent Labs  Lab 04/23/18 0605  WBC 5.5  RBC 3.31*  HGB 9.6*  HCT 31.7*  MCV 95.8  MCH 29.0  MCHC 30.3  RDW 16.6*  PLT 240    Cardiac EnzymesNo results for input(s): TROPONINI in the last 168 hours. No results for input(s): TROPIPOC in the last 168 hours.   BNPNo results for input(s): BNP, PROBNP in the last 168 hours.   DDimer No results for input(s): DDIMER in the last 168 hours.   Radiology  No results found.  Cardiac Studies   Echocardiogram 02/2018: Study Conclusions  - Left ventricle: The cavity size was normal. Wall thickness was increased in a pattern of mild LVH. Systolic function was vigorous. The estimated ejection fraction was in the range of 65% to 70%. Wall motion was normal; there were no regional wall motion abnormalities. Doppler parameters are consistent with pseudonormal left ventricular relaxation (grade 2 diastolic dysfunction). The E/A ratio is >1.5. The E/e&' ratio is >15, suggesting elevated LV filling pressure. - Left atrium: The atrium was mildly dilated. - Inferior vena cava: The vessel was normal in size. The respirophasic diameter changes were in the normal range (>= 50%), consistent with normal central venous pressure.  Impressions:  - Compared to a prior study in 2016, there is no  significant change. LV filling pressure is elevated.   Patient Profile     71 y.o.malewith chronic diastolic heart failure, OSA, hypertension, diabetes and mild pulmonary hypertension admitted 04/08/18 for back surgery and developed increased shortness of breath post operatively. Cardiology following for acute on chronic diastolic CHF.  Assessment & Plan    1. Acute on chronic diastolic CHF: Torsemide restarted yesterday after being held over the weekend for bump in Cr. Cr stable today at 1.60. O2 sat improved this AM to 89-90% on RA (goal 88-92% given COPD history), however he does still have significant LE edema.  - Continue torsemide BID - Would continue to hold metolazone and home ARB - Continue to utilize compression stockings and elevation of LE  2. HTN: BP consistently elevated. ARB on hold this admission due to acute on chronic renal insufficiency. - Continue metoprolol and hydralazine - may need to consider uptitration   3. Acute on chronic renal insufficiency: Cr stable at 1.6 today. Torsemide held over the weekend given bump in Cr.  - Continue to monitor Cr closely  4. Hypokalemia: K 3.8 this AM. Patient to receive K 40 mEq today.  - Continue to monitor K with diuresis and replete as needed for goal >4  5. UTI: patient reported penile discharge. UA+. Empirically started on macrobid - UCx pending - Monitor Cr closely with macrobid use.     For questions or updates, please contact [place] Please consult [email or web address] for contact info under Cardiology/STEMI.      Signed, Abigail Butts, [name]  04/27/2018, 9:29 AM   [phone]  History and all data above reviewed.  Patient examined.  I agree with the findings as above.  No acute complaints this morning.  He just completed therapy x 3 and is tired.  The patient exam reveals COR:RRR  ,  Lungs: Clear, decreased breath sounds  ,  Abd: Positive bowel sounds, no rebound no guarding, Ext Moderately severe edema   .  All available labs, radiology testing, previous records reviewed. Agree with documented assessment and plan. Acute on chronic diastolic HF.  Creat up slightly but I would like to try to continue current diuretic.  He is keeping his feet up as much as he can.  Continue compression stockings.  Follow creat in AM.    Minus Breeding  10:33 AM  04/27/2018

## 2018-04-28 ENCOUNTER — Inpatient Hospital Stay (HOSPITAL_COMMUNITY): Payer: Medicare Other | Admitting: Physical Therapy

## 2018-04-28 ENCOUNTER — Inpatient Hospital Stay (HOSPITAL_COMMUNITY): Payer: Medicare Other | Admitting: Occupational Therapy

## 2018-04-28 DIAGNOSIS — R7309 Other abnormal glucose: Secondary | ICD-10-CM

## 2018-04-28 LAB — BASIC METABOLIC PANEL
Anion gap: 8 (ref 5–15)
BUN: 32 mg/dL — ABNORMAL HIGH (ref 6–20)
CHLORIDE: 97 mmol/L — AB (ref 101–111)
CO2: 38 mmol/L — AB (ref 22–32)
CREATININE: 1.62 mg/dL — AB (ref 0.61–1.24)
Calcium: 8.9 mg/dL (ref 8.9–10.3)
GFR calc non Af Amer: 40 mL/min — ABNORMAL LOW (ref >60)
GFR, EST AFRICAN AMERICAN: 47 mL/min — AB (ref >60)
Glucose, Bld: 130 mg/dL — ABNORMAL HIGH (ref 65–99)
Potassium: 3.9 mmol/L (ref 3.5–5.1)
Sodium: 143 mmol/L (ref 135–145)

## 2018-04-28 LAB — GLUCOSE, CAPILLARY
GLUCOSE-CAPILLARY: 145 mg/dL — AB (ref 65–99)
GLUCOSE-CAPILLARY: 206 mg/dL — AB (ref 65–99)
Glucose-Capillary: 120 mg/dL — ABNORMAL HIGH (ref 65–99)
Glucose-Capillary: 152 mg/dL — ABNORMAL HIGH (ref 65–99)

## 2018-04-28 MED ORDER — CEPHALEXIN 250 MG PO CAPS
500.0000 mg | ORAL_CAPSULE | Freq: Four times a day (QID) | ORAL | Status: DC
  Administered 2018-04-28 – 2018-04-29 (×6): 500 mg via ORAL
  Filled 2018-04-28 (×7): qty 2

## 2018-04-28 NOTE — Plan of Care (Signed)
  Problem: SCI BLADDER ELIMINATION Goal: RH STG MANAGE BLADDER WITH ASSISTANCE Description STG Manage Bladder With Assistance. Mod  Outcome: Progressing   Problem: RH SKIN INTEGRITY Goal: RH STG SKIN FREE OF INFECTION/BREAKDOWN Description Remain free of breakdown with mod assist  Outcome: Progressing Goal: RH STG MAINTAIN SKIN INTEGRITY WITH ASSISTANCE Description STG Maintain Skin Integrity With Assistance. Mod  Outcome: Progressing   Problem: SCI BOWEL ELIMINATION Goal: RH STG MANAGE BOWEL WITH ASSISTANCE Description STG Manage Bowel with mod assistance.  Outcome: Progressing Goal: RH STG SCI MANAGE BOWEL WITH MEDICATION WITH ASSISTANCE Description STG SCI Manage bowel with medication with assistance. Outcome: Progressing   Problem: RH SAFETY Goal: RH STG ADHERE TO SAFETY PRECAUTIONS W/ASSISTANCE/DEVICE Description STG Adhere to Safety Precautions With mod Assistance/Device.  Outcome: Progressing   Problem: RH KNOWLEDGE DEFICIT SCI Goal: RH STG INCREASE KNOWLEDGE OF SELF CARE AFTER SCI Description Increase knowledge of self care after SCI with mod assist  Outcome: Progressing   Problem: Consults Goal: RH SPINAL CORD INJURY PATIENT EDUCATION Description  See Patient Education module for education specifics.  Outcome: Progressing

## 2018-04-28 NOTE — Progress Notes (Signed)
Subjective/Complaints: Patient seen lying in bed this morning. He states he slept well overnight. Informed by nursing regarding drainage from proximal cervical site.  ROS: Denies CP, SOB, N/V/D.   Objective: Vital Signs: Blood pressure 137/60, pulse 61, temperature 97.8 F (36.6 C), temperature source Oral, resp. rate 20, height 6' 1" (1.854 m), weight 130.7 kg (288 lb 2.3 oz), SpO2 91 %. No results found. Results for orders placed or performed during the hospital encounter of 04/16/18 (from the past 72 hour(s))  Glucose, capillary     Status: Abnormal   Collection Time: 04/25/18 11:40 AM  Result Value Ref Range   Glucose-Capillary 156 (H) 65 - 99 mg/dL  Glucose, capillary     Status: Abnormal   Collection Time: 04/25/18  4:58 PM  Result Value Ref Range   Glucose-Capillary 213 (H) 65 - 99 mg/dL  Glucose, capillary     Status: Abnormal   Collection Time: 04/25/18  9:03 PM  Result Value Ref Range   Glucose-Capillary 144 (H) 65 - 99 mg/dL  Basic metabolic panel     Status: Abnormal   Collection Time: 04/26/18  5:47 AM  Result Value Ref Range   Sodium 140 135 - 145 mmol/L   Potassium 3.6 3.5 - 5.1 mmol/L   Chloride 97 (L) 101 - 111 mmol/L   CO2 38 (H) 22 - 32 mmol/L   Glucose, Bld 129 (H) 65 - 99 mg/dL   BUN 33 (H) 6 - 20 mg/dL   Creatinine, Ser 1.55 (H) 0.61 - 1.24 mg/dL   Calcium 8.9 8.9 - 10.3 mg/dL   GFR calc non Af Amer 43 (L) >60 mL/min   GFR calc Af Amer 50 (L) >60 mL/min    Comment: (NOTE) The eGFR has been calculated using the CKD EPI equation. This calculation has not been validated in all clinical situations. eGFR's persistently <60 mL/min signify possible Chronic Kidney Disease.    Anion gap 5 5 - 15    Comment: Performed at [facility] [address] [place], [place] [address]  Glucose, capillary     Status: Abnormal   Collection Time: 04/26/18  6:41 AM  Result Value Ref Range   Glucose-Capillary 115 (H) 65 - 99 mg/dL  Glucose, capillary     Status:  Abnormal   Collection Time: 04/26/18 12:00 PM  Result Value Ref Range   Glucose-Capillary 185 (H) 65 - 99 mg/dL  Glucose, capillary     Status: Abnormal   Collection Time: 04/26/18  4:53 PM  Result Value Ref Range   Glucose-Capillary 137 (H) 65 - 99 mg/dL  Urinalysis, Routine w reflex microscopic     Status: Abnormal   Collection Time: 04/26/18  8:34 PM  Result Value Ref Range   Color, Urine YELLOW YELLOW   APPearance CLOUDY (A) CLEAR   Specific Gravity, Urine 1.010 1.005 - 1.030   pH 7.0 5.0 - 8.0   Glucose, UA NEGATIVE NEGATIVE mg/dL   Hgb urine dipstick MODERATE (A) NEGATIVE   Bilirubin Urine NEGATIVE NEGATIVE   Ketones, ur NEGATIVE NEGATIVE mg/dL   Protein, ur 100 (A) NEGATIVE mg/dL   Nitrite POSITIVE (A) NEGATIVE   Leukocytes, UA LARGE (A) NEGATIVE   RBC / HPF 21-50 0 - 5 RBC/hpf   WBC, UA >50 (H) 0 - 5 WBC/hpf   Bacteria, UA FEW (A) NONE SEEN   Squamous Epithelial / LPF 0-5 0 - 5    Comment: Please note change in reference range.   WBC Clumps PRESENT  Hyaline Casts, UA PRESENT     Comment: Performed at [facility], [place] [address] [place], [place] [address]  Glucose, capillary     Status: Abnormal   Collection Time: 04/26/18 10:00 PM  Result Value Ref Range   Glucose-Capillary 219 (H) 65 - 99 mg/dL   Comment 1 Notify RN   Glucose, capillary     Status: Abnormal   Collection Time: 04/27/18  6:33 AM  Result Value Ref Range   Glucose-Capillary 138 (H) 65 - 99 mg/dL  Basic metabolic panel     Status: Abnormal   Collection Time: 04/27/18  7:20 AM  Result Value Ref Range   Sodium 143 135 - 145 mmol/L   Potassium 3.8 3.5 - 5.1 mmol/L   Chloride 95 (L) 101 - 111 mmol/L   CO2 37 (H) 22 - 32 mmol/L   Glucose, Bld 122 (H) 65 - 99 mg/dL   BUN 29 (H) 6 - 20 mg/dL   Creatinine, Ser 1.60 (H) 0.61 - 1.24 mg/dL   Calcium 9.2 8.9 - 10.3 mg/dL   GFR calc non Af Amer 41 (L) >60 mL/min   GFR calc Af Amer 48 (L) >60 mL/min    Comment: (NOTE) The eGFR has been calculated  using the CKD EPI equation. This calculation has not been validated in all clinical situations. eGFR's persistently <60 mL/min signify possible Chronic Kidney Disease.    Anion gap 11 5 - 15    Comment: Performed at [facility] [address] [place], [place] [address]  Glucose, capillary     Status: Abnormal   Collection Time: 04/27/18 11:38 AM  Result Value Ref Range   Glucose-Capillary 142 (H) 65 - 99 mg/dL  Glucose, capillary     Status: Abnormal   Collection Time: 04/27/18  4:35 PM  Result Value Ref Range   Glucose-Capillary 148 (H) 65 - 99 mg/dL  Glucose, capillary     Status: Abnormal   Collection Time: 04/27/18  8:54 PM  Result Value Ref Range   Glucose-Capillary 223 (H) 65 - 99 mg/dL  Glucose, capillary     Status: Abnormal   Collection Time: 04/28/18  6:52 AM  Result Value Ref Range   Glucose-Capillary 120 (H) 65 - 99 mg/dL    Constitutional: No distress . Vital signs reviewed. HENT: Normocephalic.  Atraumatic. Eyes: EOMI. No discharge. Cardiovascular: RRR. No JVD. Respiratory: CTA Bilaterally. Normal effort. GI: BS +. Non-distended. Musc/Skel:  LE edema. No tenderness. Neuro: Alert and Oriented  Motor: 5/5 BUE proximal to distal 3/5 BLE HF, 4-/5 distally (unchanged) Skin: ulcer on left distal tibia Proximal cervical incision with serous drainage  Assessment/Plan: 1. 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 Physiatrist is providing close team supervision and 24 hour management of active medical problems listed below. Physiatrist and rehab team continue to assess barriers to discharge/monitor patient progress toward functional and medical goals. FIM: Function - Bathing Position: Bed(LB at bed level and UB seated EOB) Body parts bathed by patient: Right arm, Left arm, Chest, Abdomen, Front perineal area Body parts bathed by helper: Buttocks, Right  upper leg, Left upper leg, Back Bathing not applicable: Left lower leg, Right lower leg(Unna boots) Assist Level: (Mod assist)  Function- Upper Body Dressing/Undressing What is the patient wearing?: Pull over shirt/dress Pull over shirt/dress - Perfomed by patient: Thread/unthread right sleeve, Thread/unthread left sleeve, Put head through opening, Pull shirt over trunk  Assist Level: Set up Function - Lower Body Dressing/Undressing What is the patient wearing?: Pants Position: Bed Pants- Performed by helper: Thread/unthread right pants leg, Thread/unthread left pants leg, Pull pants up/down Assist for footwear: Dependant Assist for lower body dressing: (Total assist)  Function - Toileting Toileting activity did not occur: No continent bowel/bladder event Toileting steps completed by helper: Adjust clothing prior to toileting, Performs perineal hygiene, Adjust clothing after toileting Toileting Assistive Devices: Grab bar or rail, Toilet aid Assist level: More than reasonable time, Set up/obtain supplies, Two helpers  Function - Air cabin crew transfer activity did not occur: Safety/medical concerns Toilet transfer assistive device: Bedside commode, Mechanical lift Mechanical lift: Sara Assist level to toilet: Maximal assist (Pt 25 - 49%/lift and lower) Assist level from toilet: Maximal assist (Pt 25 - 49%/lift and lower) Assist level to bedside commode (at bedside): Moderate assist (Pt 50 - 74%/lift or lower) Assist level from bedside commode (at bedside): Moderate assist (Pt 50 - 74%/lift or lower)  Function - Chair/bed transfer Chair/bed transfer method: Lateral scoot Chair/bed transfer assist level: Touching or steadying assistance (Pt > 75%) Chair/bed transfer assistive device: Sliding board Mechanical lift: Clarise Cruz Chair/bed transfer details: Verbal cues for safe use of DME/AE, Manual facilitation for placement, Manual facilitation for weight shifting, Verbal cues for  precautions/safety  Function - Locomotion: Wheelchair Will patient use wheelchair at discharge?: Yes Type: Manual Max wheelchair distance: 150 Assist Level: Supervision or verbal cues Wheel 50 feet with 2 turns activity did not occur: Safety/medical concerns Assist Level: Supervision or verbal cues Wheel 150 feet activity did not occur: Safety/medical concerns Assist Level: Supervision or verbal cues Turns around,maneuvers to table,bed, and toilet,negotiates 3% grade,maneuvers on rugs and over doorsills: No Function - Locomotion: Ambulation Ambulation activity did not occur: [name] Assistive device: Parallel bars Max distance: 16' Assist level: Touching or steadying assistance (Pt > 75%) Assist level: Touching or steadying assistance (Pt > 75%)  Function - Comprehension Comprehension: Auditory Comprehension assist level: Follows basic conversation/direction with extra time/assistive device  Function - Expression Expression: Verbal Expression assist level: Expresses basic 90% of the time/requires cueing < 10% of the time.  Function - Social Interaction Social Interaction assist level: Interacts appropriately 90% of the time - Needs monitoring or encouragement for participation or interaction.  Function - Problem Solving Problem solving assist level: Solves basic problems with no assist  Function - Memory Memory assist level: Recognizes or recalls 90% of the time/requires cueing < 10% of the time Patient normally able to recall (first 3 days only): Current season, Location of own room, Staff names and faces, That he or she is in a hospital  Medical Problem List and Plan: 1.  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  DVT Prophylaxis/Anticoagulation: SCDs.      Vascular study negative but unna boots limited study 3. Pain Management: Neurontin 400 mg 3 times daily 4. Mood:  Lexapro 10 mg daily 5. Neuropsych: This patient is capable of making decisions on his own behalf. 6. Skin/Wound Care: Pressure relief measures.    Keflex started on 5/1 for surgical wound drainage 7. Fluids/Electrolytes/Nutrition: Strict I/Os. Heart healthy diet. Monitor voiding function.  8. OSA with acute hypercarbic respiratory failure: Has to use BIPAP or CPAP at nights 9. Acute on chronic diastolic CHF:  Improved with lasix which has been discontinued and changed to [facility] which has also been stopped.  Hydralazine dose adjusted. Monitor weights daily  with strict I/Os.       Filed Weights   04/26/18 0545 04/27/18 0300 04/28/18 0405  Weight: 128.2 kg (282 lb 10.1 oz) 128.4 kg (283 lb) 130.7 kg (288 lb 2.3 oz)    ? Trending up on 5/1  Managed by cards, appreciate recs 10 Acute on chronic renal failure.    Hydralazine 50 mg 3 times daily   ?Restart ARB per Cards   Cr 1.601 4/30  Labs pending for today  Encourage appropriate fluid intake 11. Morbid obesity: BMI 40.46.  12. Hypoxic encephalopathy:   Resolved    limit sedating medications. Encourage IS. CPAP whenever asleep. 13.  Hypertension.  Lopressor 25 mg twice daily.    Monitor with increased mobility  Relatively controlled on 5/1 14.  BPH.  Flomax 0.4 mg daily, Toviaz 8 mg daily.   15.  Diabetes mellitus with peripheral neuropathy.  Hemoglobin A1c 7.7.  SSI.  Check blood sugars before meals and at bedtime   On amaryl at home, resumed. Titrate regimen as needed   Labile on 5/1 16.  Fevers.  Resolved. Blood cultures no growth.  Urine culture negative. Completed course of Omnicef 17.  Hypothyroidism.  Synthroid 18.  Constipation.  Laxative assistance, increased Senokot to 2 tablets twice a day, continue MiraLAX 1 packet/day   Dulcolax prn 19. Hypokalemia:   Potassium 3.8 on 4/30   Continue supplementation   Cont to monitor 20. Acute blood loss anemia  Hemoglobin 9.6 on 4/26  Continue to monitor 21. Acute lower UTI  UA+,  Ucx remains pending  Empiric Macrobid started on 4/30, changed to Keflex on 5/1  LOS (Days) 12 A FACE TO FACE EVALUATION WAS PERFORMED  Syd Newsome Lorie Phenix 04/28/2018, 7:56 AM

## 2018-04-28 NOTE — Progress Notes (Signed)
Progress Note  Patient Name: Travis [name] Date of Encounter: 04/28/2018  Primary Cardiologist: Mertie Moores, MD   Subjective   Very fatigued after therapy.  O2 sats fell with therapy.  However, overall he thinks that he is getting fluid off and has no acute complaints.    Inpatient Medications    Scheduled Meds: . cephALEXin  500 mg Oral Q6H  . escitalopram  10 mg Oral Daily  . fesoterodine  8 mg Oral Daily  . gabapentin  400 mg Oral TID  . glimepiride  1 mg Oral Q breakfast  . hydrALAZINE  50 mg Oral Q8H  . insulin aspart  0-15 Units Subcutaneous TID WC  . levothyroxine  175 mcg Oral QAC breakfast  . metoprolol tartrate  25 mg Oral BID  . polyethylene glycol  17 g Oral Daily  . potassium chloride  20 mEq Oral BID  . pramipexole  0.5 mg Oral BID  . senna  2 tablet Oral BID  . tamsulosin  0.4 mg Oral Daily  . torsemide  80 mg Oral BID   Continuous Infusions:  PRN Meds: acetaminophen **OR** acetaminophen, bisacodyl, menthol-cetylpyridinium **OR** phenol, ondansetron **OR** ondansetron (ZOFRAN) IV, ondansetron **OR** [DISCONTINUED] ondansetron (ZOFRAN) IV, sorbitol   Vital Signs    Vitals:   04/27/18 1700 04/27/18 2008 04/28/18 0405 04/28/18 0826  BP: 120/68 (!) 122/52 137/60 (!) 145/65  Pulse: (!) 52 60 61 (!) 58  Resp:   20   Temp:   97.8 F (36.6 C)   TempSrc:   Oral   SpO2: 95%  91% 91%  Weight:   288 lb 2.3 oz (130.7 kg)   Height:        Intake/Output Summary (Last 24 hours) at 04/28/2018 1256 Last data filed at 04/28/2018 0919 Gross per 24 hour  Intake 625 ml  Output 1300 ml  Net -675 ml   Filed Weights   04/26/18 0545 04/27/18 0300 04/28/18 0405  Weight: 282 lb 10.1 oz (128.2 kg) 283 lb (128.4 kg) 288 lb 2.3 oz (130.7 kg)    Physical Exam   GEN: No  acute distress.   Neck: No  JVD Cardiac: RRR, no murmurs, rubs, or gallops.  Respiratory: Clear   to auscultation bilaterally. GI: Soft, nontender, non-distended, normal bowel sounds  MS:   Moderately severe leg edema; No deformity. Neuro:   Nonfocal  Psych: Oriented and appropriate    Labs    Chemistry Recent Labs  Lab 04/26/18 0547 04/27/18 0720 04/28/18 0720  NA 140 143 143  K 3.6 3.8 3.9  CL 97* 95* 97*  CO2 38* 37* 38*  GLUCOSE 129* 122* 130*  BUN 33* 29* 32*  CREATININE 1.55* 1.60* 1.62*  CALCIUM 8.9 9.2 8.9  GFRNONAA 43* 41* 40*  GFRAA 50* 48* 47*  ANIONGAP 5 11 8      Hematology Recent Labs  Lab 04/23/18 0605  WBC 5.5  RBC 3.31*  HGB 9.6*  HCT 31.7*  MCV 95.8  MCH 29.0  MCHC 30.3  RDW 16.6*  PLT 240    Cardiac EnzymesNo results for input(s): TROPONINI in the last 168 hours. No results for input(s): TROPIPOC in the last 168 hours.   BNPNo results for input(s): BNP, PROBNP in the last 168 hours.   DDimer No results for input(s): DDIMER in the last 168 hours.   Radiology    No results found.  Cardiac Studies   Echocardiogram 02/2018: Study Conclusions  - Left ventricle: The cavity size was normal.  Wall thickness was increased in a pattern of mild LVH. Systolic function was vigorous. The estimated ejection fraction was in the range of 65% to 70%. Wall motion was normal; there were no regional wall motion abnormalities. Doppler parameters are consistent with pseudonormal left ventricular relaxation (grade 2 diastolic dysfunction). The E/A ratio is >1.5. The E/e&' ratio is >15, suggesting elevated LV filling pressure. - Left atrium: The atrium was mildly dilated. - Inferior vena cava: The vessel was normal in size. The respirophasic diameter changes were in the normal range (>= 50%), consistent with normal central venous pressure.  Impressions:  - Compared to a prior study in 2016, there is no significant change. LV filling pressure is elevated.   Patient Profile     72 y.o.malewith chronic diastolic heart failure, OSA, hypertension, diabetes and mild pulmonary hypertension admitted 04/08/18 for back  surgery and developed increased shortness of breath post operatively. Cardiology following for acute on chronic diastolic CHF.  Assessment & Plan    1. Acute on chronic diastolic CHF:    Still with fall in sats with PT.   I think that he is slowly diuresis as his exam is improved.  I will continue current Torsemide and will follow as needed.    2. HTN: BP is slightly labile but overall well controlled.  No change in therapy.  I would continue to hold off on restarting the ARB.      3. Acute on chronic renal insufficiency: Cr stable at 1.62 today.    4. Hypokalemia: K 3.9 this AM.   No change in therapy.    5. UTI:   Currently being treated with Keflex.      For questions or updates, please contact [place] Please consult [email or web address] for contact info under Cardiology/STEMI.      Signed, Minus Breeding, MD  04/28/2018, 12:56 PM   [phone]

## 2018-04-28 NOTE — Progress Notes (Signed)
Occupational Therapy Session Note  Patient Details  Name: Travis [name] MRN: [id number] Date of Birth: [birth year]  Today's Date: 04/28/2018 OT Individual Time: 8099-8338 OT Individual Time Calculation (min): 55 min    Short Term Goals: Week 2:  OT Short Term Goal 1 (Week 2): Pt will tolerate standing 5 mins in [place] to complete 2 grooming tasks with increased endurance OT Short Term Goal 2 (Week 2): Pt will complete LB dressing with max assist with use of AE OT Short Term Goal 3 (Week 2): Pt will complete toilet transfer with mod assist with LRAD  Skilled Therapeutic Interventions/Progress Updates:    1) Treatment session with focus on ADL retraining with sit > stand and standing balance.  Pt received supine in bed reporting he did not sleep well overnight.  Engaged in bed mobility with min guard with use of bed rails and HOB elevated.  Completed UB bathing and dressing seated EOB with setup assist.  Pt donned LSO this session with setup.  Total assist sit > stand in [place] this session with decreased weight shift and difficulty with lifting up.  Pt incontinent of urine, engaged in perineal hygiene at sit > stand level in [employer], again requiring total assist to facilitate weight shift and lift buttocks off bed. Pt able to maintain standing balance with min guard while therapist managed clothing.  Completed transfer bed > w/c with slide board min assist with therapist positioning slide board.  Engaged in grooming tasks seated at sink without assist.  O2 sats dropped to 87% on 1.5L post slide board transfer.  Returned to 92% with cues for breathing technique.  2) Treatment session with focus on sit > stand and standing endurance.  Pt propelled w/c to therapy gym 150' with supervision for endurance.  Engaged in sit > stand outside parallel bars with focus on hand placement and anterior weight shift with pt able to push up from w/c to standing with mod-max assist.  Pt requires increased time for  weight shift and lift off.  Engaged in table top task while standing to challenge standing endurance while alternating UE support to progress towards LB self-care tasks.  O2 sats dropping to 86% on 1L during activity, requiring rest break and cues for breathing technique with sats returning to 91-92%.  Therapy Documentation Precautions:  Precautions Precautions: Fall, Back, Cervical Restrictions Weight Bearing Restrictions: No General:   Vital Signs: Therapy Vitals Pulse Rate: (!) 58 BP: (!) 145/65 Oxygen Therapy SpO2: 91 % O2 Device: Nasal Cannula O2 Flow Rate (L/min): 1.5 L/min Pain:  Pt with no c/o pain  See Function Navigator for Current Functional Status.   Therapy/Group: Individual Therapy  [name] 04/28/2018, 9:55 AM

## 2018-04-28 NOTE — Progress Notes (Signed)
Physical Therapy Session Note  Patient Details  Name: Travis [name] MRN: [id number] Date of Birth: [birth year]  Today's Date: 04/28/2018 PT Individual Time: 1000-1100 PT Individual Time Calculation (min): 60 min   Short Term Goals: Week 2:  PT Short Term Goal 1 (Week 2): Pt will perform sit<>stand with mod assist consistently  PT Short Term Goal 2 (Week 2): Pt will ambulate 35ft with LRAD and mod assist  PT Short Term Goal 3 (Week 2): Pt propell WC >184ft without rest and supervision assist consistently  PT Short Term Goal 4 (Week 2): Pt will perform bed mobility with min assist   Skilled Therapeutic Interventions/Progress Updates: Pt presented in w/c agreeable to therapy. Pt participated in therapy on 1L supplemental O2 throughout session. Pt propelled to rehab gym no rest breaks, SpO2 84% with recovery to 88% within 1 min with cues for diaphragmatic breathing. Pt participated in sit to stand and gait in parallel bars requiring modA sit to stand. Pt able to ambulate 12ft x 2 forward/backwards with noted heavy reliance on BUE. SpO2 6% after gait. Second trial pt ambulated total approx 44ft with pt turning within parallel bars. After therapeutic rest pt performed SB transfer to mat and performed sit to/from stand from elevated mat (24in). Pt able to perform with modA, performed standing marches 2 x 10 with RW. Performed stand pivot transfer to w/c min guard and pt transported to back to room due to increased fatigue. Pt remained in w/c at end of session with call bell within reach and dgt present.      Therapy Documentation Precautions:  Precautions Precautions: Fall, Back, Cervical Restrictions Weight Bearing Restrictions: No General:   Vital Signs: Therapy Vitals Pulse Rate: (!) 58 BP: (!) 145/65 Oxygen Therapy SpO2: 91 % O2 Device: Nasal Cannula O2 Flow Rate (L/min): 1.5 L/min Pain: Pain Assessment Pain Scale: 0-10 Pain Score: 0-No pain Other Treatments:     See Function  Navigator for Current Functional Status.   Therapy/Group: Individual Therapy  Liller Yohn  Husna Krone, PTA  04/28/2018, 12:16 PM

## 2018-04-28 NOTE — Progress Notes (Signed)
Physical Therapy Session Note  Patient Details  Name: Travis [name] MRN: [id number] Date of Birth: 1946-07-18  Today's Date: 04/28/2018 PT Individual Time: 7628-3151 PT Individual Time Calculation (min): 45 min   Short Term Goals: Week 2:  PT Short Term Goal 1 (Week 2): Pt will perform sit<>stand with mod assist consistently  PT Short Term Goal 2 (Week 2): Pt will ambulate 53f with LRAD and mod assist  PT Short Term Goal 3 (Week 2): Pt propell WC >1560fwithout rest and supervision assist consistently  PT Short Term Goal 4 (Week 2): Pt will perform bed mobility with min assist   Skilled Therapeutic Interventions/Progress Updates:   Pt received sitting in WC and agreeable to PT  [facility] without assist from PT after use of urinal for bladder clearance.   WC mobility x 15010fith Supervision assist. Min cues for posture in WC with one break to correct sitting position.   Sit<>stand transfer training x [facility] and moderate cues for set up improved anterior weight shift. Poor ability to flex Bil knees to proper ROM due to LE edema.   Gait training with RW x 6ft36fd 12ft20fn assist from PT for safety and AD management with multimodal cues for improved posture, terminal knee extension, and gait pattern.   Throughout treatment, PT monitored pt;s SpO2 on 1 L/min supplemental O2, Pt noted to desat <88% only with gait of 12 ft which took 15 seconds of pursed lip breathing to increased to >88%.   Patient returned to room and [facility] reach and all needs met.            Therapy Documentation Precautions:  Precautions Precautions: Fall, Back, Cervical Restrictions Weight Bearing Restrictions: No Vital Signs: Therapy Vitals BP: (!) 145/65 Pain:   denies.  See Function Navigator for Current Functional Status.   Therapy/Group: Individual Therapy  AustiLorie Phenix2019, 3:28 PM

## 2018-04-29 ENCOUNTER — Inpatient Hospital Stay (HOSPITAL_COMMUNITY): Payer: Medicare Other | Admitting: Occupational Therapy

## 2018-04-29 ENCOUNTER — Inpatient Hospital Stay (HOSPITAL_COMMUNITY): Payer: Medicare Other | Admitting: Physical Therapy

## 2018-04-29 LAB — GLUCOSE, CAPILLARY
GLUCOSE-CAPILLARY: 166 mg/dL — AB (ref 65–99)
GLUCOSE-CAPILLARY: 168 mg/dL — AB (ref 65–99)
Glucose-Capillary: 130 mg/dL — ABNORMAL HIGH (ref 65–99)
Glucose-Capillary: 170 mg/dL — ABNORMAL HIGH (ref 65–99)

## 2018-04-29 LAB — BASIC METABOLIC PANEL
Anion gap: 9 (ref 5–15)
BUN: 32 mg/dL — AB (ref 6–20)
CO2: 37 mmol/L — ABNORMAL HIGH (ref 22–32)
Calcium: 8.8 mg/dL — ABNORMAL LOW (ref 8.9–10.3)
Chloride: 96 mmol/L — ABNORMAL LOW (ref 101–111)
Creatinine, Ser: 1.77 mg/dL — ABNORMAL HIGH (ref 0.61–1.24)
GFR, EST AFRICAN AMERICAN: 42 mL/min — AB (ref >60)
GFR, EST NON AFRICAN AMERICAN: 36 mL/min — AB (ref >60)
Glucose, Bld: 148 mg/dL — ABNORMAL HIGH (ref 65–99)
POTASSIUM: 3.8 mmol/L (ref 3.5–5.1)
SODIUM: 142 mmol/L (ref 135–145)

## 2018-04-29 MED ORDER — SODIUM CHLORIDE 0.9 % IV SOLN
1.0000 g | Freq: Two times a day (BID) | INTRAVENOUS | Status: DC
  Administered 2018-04-29 – 2018-04-30 (×2): 1 g via INTRAVENOUS
  Filled 2018-04-29 (×2): qty 1

## 2018-04-29 MED ORDER — CEFEPIME HCL 1 G IJ SOLR: 1.0000 g | Freq: Three times a day (TID) | INTRAMUSCULAR | Status: DC

## 2018-04-29 NOTE — Plan of Care (Signed)
PT goals upgraded due to improved pt progress. See Care plan for details

## 2018-04-29 NOTE — Patient Care Conference (Signed)
Inpatient RehabilitationTeam Conference and Plan of Care Update Date: 04/28/2018   Time: 2:35 PM    Patient Name: Travis [name]      Medical Record Number: [id number]  Date of Birth: November 11, 1946 Sex: Male         Room/Bed: 4M01C/4M01C-01 Payor Info: Payor: [employer] MEDICARE / Plan: BCBS MEDICARE / Product Type: *No Product type* /    Admitting Diagnosis: cervical lumbar myelopathy  Admit Date/Time:  04/16/2018  3:04 PM Admission Comments: No comment available   Primary Diagnosis:  <principal problem not specified> Principal Problem: <principal problem not specified>  Patient Active Problem List   Diagnosis Date Noted  . Labile blood glucose   . Hypokalemia   . Type 2 diabetes mellitus with peripheral neuropathy (HCC)   . Acute on chronic diastolic (congestive) heart failure ([place])   . Myelopathy ([place]) 04/16/2018  . SOB (shortness of breath)   . Hypoxic encephalopathy ([place])   . Benign essential HTN   . Benign prostatic hyperplasia with urinary retention   . Diabetic peripheral neuropathy ([place])   . FUO (fever of unknown origin)   . Atelectasis   . Hypercarbia   . Acute on chronic respiratory failure with hypoxia and hypercapnia (HCC)   . Acute on chronic diastolic heart failure ([place]) 04/09/2018  . Chronic obstructive pulmonary disease ([place])   . DDD (degenerative disc disease), cervical   . Morbid obesity ([place])   . Post-operative pain   . S/P lumbar spinal fusion 04/08/2018  . 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 Myelopathy, spondylogenic, cervical 11/11/2017  . Excessive daytime sleepiness 11/11/2017  . 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 Slow transit constipation   . Diabetes mellitus type 2 in obese ([place])   . 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 Debility 10/08/2017  . Stage 3 chronic kidney disease ([place])   . Spinal stenosis of lumbar region   . OSA (obstructive sleep apnea)   . Acute blood loss anemia   . Fever 10/02/2017  . Urinary tract  infection without hematuria   . Chronic diastolic CHF (congestive heart failure) ([place])   . Elevated troponin I level   . Type II diabetes mellitus ([place]) 08/19/2015  . HCAP (healthcare-associated pneumonia) 08/19/2015  . Hyperthyroidism 08/19/2015  . Hypothyroid 08/19/2015  . AKI (acute kidney injury) ([place]) 08/19/2015  . Blood poisoning   . Restless legs syndrome 03/14/2015  . Shortness of breath 03/09/2015  . Sinus bradycardia 09/21/2014  . Thoracic spondylosis with myelopathy 09/13/2014  . Cervical arthritis with myelopathy 07/18/2014  . Constipation - functional   . CAP (community acquired pneumonia) 07/10/2014  . Sepsis ([place]) 07/10/2014  . HTN (hypertension) 07/10/2014  . OSA on CPAP 07/10/2014  . Acute lower UTI 07/10/2014  . Edema leg 07/10/2014  . Acute respiratory failure with hypoxia ([place]) 07/10/2014  . 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 Other vitamin B12 deficiency anemia 07/13/2013  . 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 Polyneuropathy in other diseases classified elsewhere ([place]) 02/17/2013  . Other general symptoms(780.99) 02/17/2013  . Abnormality of gait 02/17/2013  . Cervical spondylosis with myelopathy 02/17/2013  . Back ache 09/15/2012    Expected Discharge Date: Expected Discharge Date: 05/12/18  Team Members Present: Physician leading conference: Dr. Delice Lesch Social Worker Present: Lennart Pall, LCSW Nurse Present: Rayetta Pigg, RN PT Present: Barrie Folk, PT OT Present: [name], OT SLP Present: Windell Moulding, SLP PPS Coordinator present : Daiva Nakayama, RN, CRRN  Current Status/Progress Goal Weekly Team Focus  Medical   Decreased functional mobility/paraparesis secondary to cervical and lumbar myelopathy status post decompression arthrodesis  Improve mobility, endurance, UTI, CHF, CKD, HTN/DM, surgical drainage  See above   Bowel/Bladder   sara lift to bsc for bm's, continent of b/b, recent uti,  urgency,burning, incontinent at times. uses urinal, LBM 4/30,   manage bowel with mod assist  continue  to monitor b/b q shift and prn, toileting q 2-3 hours while awake   Swallow/Nutrition/ Hydration             ADL's   mod assist bathing, total assist LB dressing either bed level or sit > stand in [place], min assist transfers with slide board, max assist sit > stand with UE support in [place]  mod assist bathing, LB dressing, toilet transfers and toileting  ADL retraining, transfers, sit > stand, education on AE to adhere to back and cervical precautions   Mobility   modA bed mobility, modA sit to stand at parallel bars, gait in parallel bars min guard heavy reliance on BUE. sit to stand from 24in mat modA with RW, min guard stand pivot once in standing   Mod A transfers, Supervision with w/c mobility  transfers, pre-gait/gait, endurance, BLE strengthening   Communication             Safety/Cognition/ Behavioral Observations            Pain   S/P L4-5 fusion, T 1-2 lami, denies pain  <2  assess pain q shift and prn   Skin   surgical incision OTA, skin glue, left shin with skin tear, unna boots ordered for LE,(currently not in use),  3+ LE edema,   no s/s of infection, skin care with mod assist  assess q shift and prn    Rehab Goals Patient on target to meet rehab goals: Yes *See Care Plan and progress notes for long and short-term goals.     Barriers to Discharge  Current Status/Progress Possible Resolutions Date Resolved   Physician    Medical stability;Weight;Other (comments)     See above  Therapies, Cards recs, follow labs, optimize DM/BP meds, supplement K+, follow weights, abx for UTI+Surgical wound      Nursing                  PT                    OT                  SLP                SW                Discharge Planning/Teaching Needs:  Plan home with wife who can provide some assistance and some private duty caregiver support.  TBD   Team Discussion:  UTI -  abx started; some drainage from surgical site.  Continue to try to wean O2 but still dropping mid 80's with activity.  Still total - max assist LB b/d but progressing off need for SARA.  Cannot weight shift s must continue to focus on standing for toileting and LB dsg. Likely min/ mod assist with LB dsg and toileting at d/c.  Plan to add gait goals and upgrade some other PT goals.  Revisions to Treatment Plan:  None    Continued Need for Acute Rehabilitation Level of Care: The patient requires daily medical  management by a physician with specialized training in physical medicine and rehabilitation for the following conditions: Daily direction of a multidisciplinary physical rehabilitation program to ensure safe treatment while eliciting the highest outcome that is of practical value to the patient.: Yes Daily medical management of patient stability for increased activity during participation in an intensive rehabilitation regime.: Yes Daily analysis of laboratory values and/or radiology reports with any subsequent need for medication adjustment of medical intervention for : Neurological problems;Diabetes problems;Cardiac problems;Blood pressure problems;Renal problems;Wound care problems  Anais Koenen 04/29/2018, 5:09 PM

## 2018-04-29 NOTE — Progress Notes (Signed)
Subjective/Complaints: Patient seen lying in bed this morning. He states he slept well overnight.He denies complaints.  ROS: denies CP, SOB, N/V/D.   Objective: Vital Signs: Blood pressure (!) 126/52, pulse (!) 55, temperature 97.8 F (36.6 C), temperature source Oral, resp. rate 18, height 6' 1" (1.854 m), weight 127.2 kg (280 lb 6.8 oz), SpO2 91 %. No results found. Results for orders placed or performed during the hospital encounter of 04/16/18 (from the past 72 hour(s))  Glucose, capillary     Status: Abnormal   Collection Time: 04/26/18 12:00 PM  Result Value Ref Range   Glucose-Capillary 185 (H) 65 - 99 mg/dL  Glucose, capillary     Status: Abnormal   Collection Time: 04/26/18  4:53 PM  Result Value Ref Range   Glucose-Capillary 137 (H) 65 - 99 mg/dL  Culture, Urine     Status: Abnormal (Preliminary result)   Collection Time: 04/26/18  8:03 PM  Result Value Ref Range   Specimen Description URINE, RANDOM    Special Requests      NONE Performed at [facility], [place] [address], [place], [place] [address]    Culture (A)     >=100,000 COLONIES/mL PSEUDOMONAS AERUGINOSA 40,000 COLONIES/mL KLEBSIELLA ORNITHINOLYTICA    Report Status PENDING   Urinalysis, Routine w reflex microscopic     Status: Abnormal   Collection Time: 04/26/18  8:34 PM  Result Value Ref Range   Color, Urine YELLOW YELLOW   APPearance CLOUDY (A) CLEAR   Specific Gravity, Urine 1.010 1.005 - 1.030   pH 7.0 5.0 - 8.0   Glucose, UA NEGATIVE NEGATIVE mg/dL   Hgb urine dipstick MODERATE (A) NEGATIVE   Bilirubin Urine NEGATIVE NEGATIVE   Ketones, ur NEGATIVE NEGATIVE mg/dL   Protein, ur 100 (A) NEGATIVE mg/dL   Nitrite POSITIVE (A) NEGATIVE   Leukocytes, UA LARGE (A) NEGATIVE   RBC / HPF 21-50 0 - 5 RBC/hpf   WBC, UA >50 (H) 0 - 5 WBC/hpf   Bacteria, UA FEW (A) NONE SEEN   Squamous Epithelial / LPF 0-5 0 - 5    Comment: Please note change in reference range.   WBC Clumps PRESENT    Hyaline  Casts, UA PRESENT     Comment: Performed at [facility] [address] [place], [place] [address]  Glucose, capillary     Status: Abnormal   Collection Time: 04/26/18 10:00 PM  Result Value Ref Range   Glucose-Capillary 219 (H) 65 - 99 mg/dL   Comment 1 Notify RN   Glucose, capillary     Status: Abnormal   Collection Time: 04/27/18  6:33 AM  Result Value Ref Range   Glucose-Capillary 138 (H) 65 - 99 mg/dL  Basic metabolic panel     Status: Abnormal   Collection Time: 04/27/18  7:20 AM  Result Value Ref Range   Sodium 143 135 - 145 mmol/L   Potassium 3.8 3.5 - 5.1 mmol/L   Chloride 95 (L) 101 - 111 mmol/L   CO2 37 (H) 22 - 32 mmol/L   Glucose, Bld 122 (H) 65 - 99 mg/dL   BUN 29 (H) 6 - 20 mg/dL   Creatinine, Ser 1.60 (H) 0.61 - 1.24 mg/dL   Calcium 9.2 8.9 - 10.3 mg/dL   GFR calc non Af Amer 41 (L) >60 mL/min   GFR calc Af Amer 48 (L) >60 mL/min    Comment: (NOTE) The eGFR has been calculated using the CKD EPI equation. This calculation  has not been validated in all clinical situations. eGFR's persistently <60 mL/min signify possible Chronic Kidney Disease.    Anion gap 11 5 - 15    Comment: Performed at [facility] [address] [place], [place] [address]  Glucose, capillary     Status: Abnormal   Collection Time: 04/27/18 11:38 AM  Result Value Ref Range   Glucose-Capillary 142 (H) 65 - 99 mg/dL  Glucose, capillary     Status: Abnormal   Collection Time: 04/27/18  4:35 PM  Result Value Ref Range   Glucose-Capillary 148 (H) 65 - 99 mg/dL  Glucose, capillary     Status: Abnormal   Collection Time: 04/27/18  8:54 PM  Result Value Ref Range   Glucose-Capillary 223 (H) 65 - 99 mg/dL  Glucose, capillary     Status: Abnormal   Collection Time: 04/28/18  6:52 AM  Result Value Ref Range   Glucose-Capillary 120 (H) 65 - 99 mg/dL  Basic metabolic panel     Status: Abnormal   Collection Time: 04/28/18  7:20 AM  Result Value Ref Range   Sodium 143 135 - 145  mmol/L   Potassium 3.9 3.5 - 5.1 mmol/L   Chloride 97 (L) 101 - 111 mmol/L   CO2 38 (H) 22 - 32 mmol/L   Glucose, Bld 130 (H) 65 - 99 mg/dL   BUN 32 (H) 6 - 20 mg/dL   Creatinine, Ser 1.62 (H) 0.61 - 1.24 mg/dL   Calcium 8.9 8.9 - 10.3 mg/dL   GFR calc non Af Amer 40 (L) >60 mL/min   GFR calc Af Amer 47 (L) >60 mL/min    Comment: (NOTE) The eGFR has been calculated using the CKD EPI equation. This calculation has not been validated in all clinical situations. eGFR's persistently <60 mL/min signify possible Chronic Kidney Disease.    Anion gap 8 5 - 15    Comment: Performed at [facility] [address], [place], [place] [address]  Glucose, capillary     Status: Abnormal   Collection Time: 04/28/18 11:58 AM  Result Value Ref Range   Glucose-Capillary 145 (H) 65 - 99 mg/dL   Comment 1 Notify RN   Glucose, capillary     Status: Abnormal   Collection Time: 04/28/18  4:25 PM  Result Value Ref Range   Glucose-Capillary 152 (H) 65 - 99 mg/dL  Glucose, capillary     Status: Abnormal   Collection Time: 04/28/18  9:18 PM  Result Value Ref Range   Glucose-Capillary 206 (H) 65 - 99 mg/dL  Glucose, capillary     Status: Abnormal   Collection Time: 04/29/18  6:27 AM  Result Value Ref Range   Glucose-Capillary 130 (H) 65 - 99 mg/dL  Basic metabolic panel     Status: Abnormal   Collection Time: 04/29/18  6:47 AM  Result Value Ref Range   Sodium 142 135 - 145 mmol/L   Potassium 3.8 3.5 - 5.1 mmol/L   Chloride 96 (L) 101 - 111 mmol/L   CO2 37 (H) 22 - 32 mmol/L   Glucose, Bld 148 (H) 65 - 99 mg/dL   BUN 32 (H) 6 - 20 mg/dL   Creatinine, Ser 1.77 (H) 0.61 - 1.24 mg/dL   Calcium 8.8 (L) 8.9 - 10.3 mg/dL   GFR calc non Af Amer 36 (L) >60 mL/min   GFR calc Af Amer 42 (L) >60 mL/min    Comment: (NOTE) The eGFR has been calculated using the  CKD EPI equation. This calculation has not been validated in all clinical situations. eGFR's persistently <60 mL/min signify possible Chronic  Kidney Disease.    Anion gap 9 5 - 15    Comment: Performed at [facility] [address], [place], [place] [address]    Constitutional: No distress . Vital signs reviewed. HENT: Normocephalic.  Atraumatic. Eyes: EOMI. No discharge. Cardiovascular: RRR. No JVD. Respiratory: CTA Bilaterally. Normal effort. GI: BS +. Non-distended. Musc/Skel:  LE edema. No tenderness. Neuro: Alert and Oriented  Motor: 5/5 BUE proximal to distal 3/5 BLE HF, 4-/5 distally (stable) Skin: ulcer on left distal tibia Surgical incision with dressing C/D/I  Assessment/Plan: 1. 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 Physiatrist is providing close team supervision and 24 hour management of active medical problems listed below. Physiatrist and rehab team continue to assess barriers to discharge/monitor patient progress toward functional and medical goals. FIM: Function - Bathing Position: Sitting EOB Body parts bathed by patient: Right arm, Left arm, Chest, Abdomen, Front perineal area, Right upper leg, Left upper leg Body parts bathed by helper: Buttocks, Right lower leg, Left lower leg, Back Bathing not applicable: Left lower leg, Right lower leg(Unna boots) Assist Level: (Mod assist)  Function- Upper Body Dressing/Undressing What is the patient wearing?: Pull over shirt/dress Pull over shirt/dress - Perfomed by patient: Thread/unthread right sleeve, Thread/unthread left sleeve, Put head through opening, Pull shirt over trunk Assist Level: Set up Function - Lower Body Dressing/Undressing What is the patient wearing?: Pants, Non-skid slipper socks, Ted Hose Position: Sitting EOB(sit > stand in [place]) Pants- Performed by helper: Thread/unthread right pants leg, Thread/unthread left pants leg, Pull pants up/down Non-skid slipper socks- Performed by helper: Don/doff right sock, Don/doff left  sock TED Hose - Performed by helper: Don/doff right TED hose, Don/doff left TED hose Assist for footwear: Dependant Assist for lower body dressing: (Total assist)  Function - Toileting Toileting activity did not occur: No continent bowel/bladder event Toileting steps completed by helper: Adjust clothing prior to toileting, Performs perineal hygiene, Adjust clothing after toileting Toileting Assistive Devices: Grab bar or rail, Toilet aid Assist level: More than reasonable time, Set up/obtain supplies, Two helpers  Function - Air cabin crew transfer activity did not occur: Safety/medical concerns Toilet transfer assistive device: Bedside commode, Mechanical lift Mechanical lift: Sara Assist level to toilet: Maximal assist (Pt 25 - 49%/lift and lower) Assist level from toilet: Maximal assist (Pt 25 - 49%/lift and lower) Assist level to bedside commode (at bedside): Moderate assist (Pt 50 - 74%/lift or lower) Assist level from bedside commode (at bedside): Moderate assist (Pt 50 - 74%/lift or lower)  Function - Chair/bed transfer Chair/bed transfer method: Lateral scoot Chair/bed transfer assist level: Touching or steadying assistance (Pt > 75%) Chair/bed transfer assistive device: Sliding board Mechanical lift: Clarise Cruz Chair/bed transfer details: Verbal cues for safe use of DME/AE, Manual facilitation for placement, Manual facilitation for weight shifting, Verbal cues for precautions/safety  Function - Locomotion: Wheelchair Will patient use wheelchair at discharge?: Yes Type: Manual Max wheelchair distance: 150 Assist Level: Supervision or verbal cues Wheel 50 feet with 2 turns activity did not occur: Safety/medical concerns Assist Level: Supervision or verbal cues Wheel 150 feet activity did not occur: Safety/medical concerns Assist Level: Supervision or verbal cues Turns around,maneuvers to table,bed, and toilet,negotiates 3% grade,maneuvers on rugs and over doorsills:  No Function - Locomotion: Ambulation Ambulation activity did not occur:  [name] Assistive device: Parallel bars Max distance: 107' Assist level: Touching or steadying assistance (Pt > 75%) Assist level: Touching or steadying assistance (Pt > 75%)  Function - Comprehension Comprehension: Auditory Comprehension assist level: Follows basic conversation/direction with extra time/assistive device  Function - Expression Expression: Verbal Expression assist level: Expresses basic 90% of the time/requires cueing < 10% of the time.  Function - Social Interaction Social Interaction assist level: Interacts appropriately 90% of the time - Needs monitoring or encouragement for participation or interaction.  Function - Problem Solving Problem solving assist level: Solves basic problems with no assist  Function - Memory Memory assist level: Recognizes or recalls 90% of the time/requires cueing < 10% of the time Patient normally able to recall (first 3 days only): Current season, Location of own room, Staff names and faces, That he or she is in a hospital  Medical Problem List and Plan: 1.  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  DVT Prophylaxis/Anticoagulation: SCDs.      Vascular study negative but unna boots limited study 3. Pain Management: Neurontin 400 mg 3 times daily 4. Mood: Lexapro 10 mg daily 5. Neuropsych: This patient is capable of making decisions on his own behalf. 6. Skin/Wound Care: Pressure relief measures.    Keflex started on 5/1 for surgical wound drainage 7. Fluids/Electrolytes/Nutrition: Strict I/Os. Heart healthy diet. Monitor voiding function.  8. OSA with acute hypercarbic respiratory failure: Has to use BIPAP or CPAP at nights 9. Acute on chronic diastolic CHF:  Improved with lasix which has been discontinued and changed to [facility] which has also been stopped.   Hydralazine dose adjusted. Monitor weights daily with strict I/Os.       Filed Weights   04/27/18 0300 04/28/18 0405 04/29/18 0527  Weight: 128.4 kg (283 lb) 130.7 kg (288 lb 2.3 oz) 127.2 kg (280 lb 6.8 oz)    ? Reliability  Managed by cards, appreciate recs 10 Acute on chronic renal failure.   Hydralazine 50 mg 3 times daily   Hold ARB per Cards   Cr 1.77 on 5/2  Encourage appropriate fluid intake 11. Morbid obesity: BMI 40.46.  12. Hypoxic encephalopathy:   Resolved    limit sedating medications. Encourage IS. CPAP whenever asleep. 13.  Hypertension.  Lopressor 25 mg twice daily.    Monitor with increased mobility  Relatively controlled on 5/1 14.  BPH.  Flomax 0.4 mg daily, Toviaz 8 mg daily.   15.  Diabetes mellitus with peripheral neuropathy.  Hemoglobin A1c 7.7.  SSI.  Check blood sugars before meals and at bedtime   On amaryl at home, resumed. Titrate regimen as needed   Labile on 5/2 16.  Fevers.  Resolved. Blood cultures no growth.  Urine culture negative. Completed course of Omnicef 17.  Hypothyroidism.  Synthroid 18.  Constipation.  Laxative assistance, increased Senokot to 2 tablets twice a day, continue MiraLAX 1 packet/day   Dulcolax prn 19. Hypokalemia:   Potassium 3.8 on 5/2   Continue supplementation   Cont to monitor 20. Acute blood loss anemia  Hemoglobin 9.6 on 4/26  Continue to monitor 21. Acute lower UTI  UA+, Ucx with greater than 100,000 Pseudomonas and greater than 40,000 Klebsiella, sensitivities pending  Empiric Macrobid started on 4/30, changed to Keflex on 5/1  LOS (Days) 13 A FACE TO FACE EVALUATION WAS PERFORMED  Travis [name] 04/29/2018, 8:16 AM

## 2018-04-29 NOTE — Progress Notes (Signed)
Occupational Therapy Session Note  Patient Details  Name: Travis [name] MRN: [id number] Date of Birth: 1946-05-18  Today's Date: 04/29/2018 OT Individual Time: 0830-0900 OT Individual Time Calculation (min): 30 min    Short Term Goals: Week 1:  OT Short Term Goal 1 (Week 1): Pt will maintain standing tolerance in stedy for20 minutes while performing grooming at sink OT Short Term Goal 1 - Progress (Week 1): Revised due to lack of progress OT Short Term Goal 2 (Week 1): Pt will perform diaphramitic breathing strategies with 1 questioning cue OT Short Term Goal 2 - Progress (Week 1): Met OT Short Term Goal 3 (Week 1): Pt will transfer from sit to stand from elevated surface with max assist to prepare for donning pants. OT Short Term Goal 3 - Progress (Week 1): Met OT Short Term Goal 4 (Week 1): Pt will verbalize 3/3 back precautions with no cues.   OT Short Term Goal 4 - Progress (Week 1): Met Week 2:  OT Short Term Goal 1 (Week 2): Pt will tolerate standing 5 mins in [place] to complete 2 grooming tasks with increased endurance OT Short Term Goal 2 (Week 2): Pt will complete LB dressing with max assist with use of AE OT Short Term Goal 3 (Week 2): Pt will complete toilet transfer with mod assist with LRAD  Skilled Therapeutic Interventions/Progress Updates:    1:1 Pt on BSC when arrived (still attached to the [place]). Performed sit to stands and standing tolerance in the [place] while therapist assisted with perihygiene. Pt transitioned into the w/c via [place]. Assisted with threading pants and donning tEDS and shoes (for better support).  Pt performed sit to stand from w/c with mod A with RW with extra time to achieve fully erect position in standing.  Therapist A with pulling up clothing. Pt able to perform grooming at sink mod I.   Pt still on O2 on 2 lit to sustained sats at 90 or above with activity.    Therapy Documentation Precautions:  Precautions Precautions: Fall, Back,  Cervical Restrictions Weight Bearing Restrictions: No Pain:  no c/o pain in session  Other Treatments:    See Function Navigator for Current Functional Status.   Therapy/Group: Individual Therapy  Willeen Cass [facility] 04/29/2018, 10:14 AM

## 2018-04-29 NOTE — Progress Notes (Signed)
Physical Therapy Session Note  Patient Details  Name: Travis [name] MRN: [id number] Date of Birth: 04-10-1946  Today's Date: 04/29/2018 PT Individual Time: 0905-1005 1515-1600 PT Individual Time Calculation (min): 60 min 45 min   Short Term Goals: Week 2:  PT Short Term Goal 1 (Week 2): Pt will perform sit<>stand with mod assist consistently  PT Short Term Goal 2 (Week 2): Pt will ambulate 47f with LRAD and mod assist  PT Short Term Goal 3 (Week 2): Pt propell WC >1553fwithout rest and supervision assist consistently  PT Short Term Goal 4 (Week 2): Pt will perform bed mobility with min assist   Skilled Therapeutic Interventions/Progress Updates:   Pt received sitting in WC and agreeable to PT  WC mobility  To rehab gym. With supervision assist and min cues for doorway management.   Stand pivot transfers for WC<>mat table x 4 throughout treatment with mod assist to stand and min assist to pivot.   Prolonged HS stretch sitting EOB x 2 min BLE with overpressure from PT. Improved ROM in hip flexion and knee extension following stretch.  Gait training with RW x 2231fith min assist from PT and moderate cues for posture and gait pattern to reduce foot drag on the L.   Sit<>stand from mat table x 2 with mod assist from elevated height.   Pt reports need to sudden urination following transfer back to WC. PT returned to room and attempted toileting in urinal, but noted to already have been incontinent. Clothing management following incontinent bladder movement with mod assist into standing and max assist to  Manage pants and underpants.   [facility] and all needs met.      Session 2.   Pt received sitting in WC and agreeable to PT.WC mobility  To rehab gym. With supervision assist and no supplemental O2 . Pt desat to 84%. 2 minutes to increased to >88%, with O2 increased to to 1L/min   Stand pivot transfer to [place] with mod assist and mod  cues for set up.   Nustep endurnace training with 1 L supplemental O2 x 3 min, 2 min and 3 min. Pt ntoed to desat to 84% on first 3 min and only to 88%on 2nd and 3rd bouts. When pt completed endurance exercise, was noted to desat to 82% and returned to >88% in 1 minutes.   Stand pivot [facility] back to [facility]. Patient returned to room and [facility] and all needs met.         Therapy Documentation Precautions:  Precautions Precautions: Fall, Back, Cervical Restrictions Weight Bearing Restrictions: No Vital Signs: Therapy Vitals Temp: (!) 97.5 F (36.4 C) Temp Source: Oral Pulse Rate: 66 BP: (!) 149/68 Patient Position (if appropriate): Sitting Oxygen Therapy SpO2: 93 % Pain: Denies   See Function Navigator for Current Functional Status.   Therapy/Group: Individual Therapy  AusLorie Phenix2/2019, 2:43 PM

## 2018-04-29 NOTE — Progress Notes (Signed)
Social Work Patient ID: Travis [name], male   DOB: [birth year], 72 y.o.   MRN: [id number]   Met with pt and wife this afternoon to review team conference.  Both aware team continues to plan toward dc date of 5/15 and min/ mod assist goals overall.  Pt hopeful for earlier d/c, however, we discussed current functional levels and he recognizes he has a lot of improvements still to make.  Will continue to follow.  Alexandria Shiflett, LCSW

## 2018-04-29 NOTE — Progress Notes (Signed)
Pharmacy Antibiotic Note  Travis [name] is a 72 y.o. male admitted on 04/16/2018 with purulent UTI and wound drainage after recent surgery.  Pharmacy has been consulted for Cefepime dosing.  Plan: Cefepime 1 gram IV every 12 hours (started by PA, dose okay) Monitor clinical progress, cultures/sensitivities, renal function, abx plan   Height: 6\' 1"  (185.4 cm) Weight: 280 lb 6.8 oz (127.2 kg) IBW/kg (Calculated) : 79.9  Temp (24hrs), Avg:97.5 F (36.4 C), Min:97.5 F (36.4 C), Max:97.5 F (36.4 C)  Recent Labs  Lab 04/23/18 0605  04/25/18 0709 04/26/18 0547 04/27/18 0720 04/28/18 0720 04/29/18 0647  WBC 5.5  --   --   --   --   --   --   CREATININE 1.50*   < > 1.85* 1.55* 1.60* 1.62* 1.77*   < > = values in this interval not displayed.    Estimated Creatinine Clearance: 51.9 mL/min (A) (by C-G formula based on SCr of 1.77 mg/dL (H)).    Allergies  Allergen Reactions  . Other Other (See Comments)    ANESTHESIA UNSPECIFIED  -- makes sick, must have antinausea medication in advance.  . Levofloxacin In D5w Itching    Possible infusion reaction 03/29/17. Tolerating oral Levaquin 03/30/17    Antimicrobials this admission: 5/1 Keflex >> 5/2 5/2 Cefepime >>   Dose adjustments this admission:  Microbiology results:  4/29 UCx: >=100,000 COLONIES/mL pseudomonas (R: Cipro, gent)                        40,000 COLONIES/mL  klebsiella ornithinolytica   Thank you for allowing [place] to participate in this patients care.  Jens Som, PharmD Clinical phone for 04/29/2018 from 3:30-10:30p: [phone] If after 10:30p, please call main pharmacy at: [phone] 04/29/2018 4:46 PM

## 2018-04-29 NOTE — Progress Notes (Signed)
Occupational Therapy Session Note  Patient Details  Name: Travis [name] MRN: [id number] Date of Birth: 11/25/1946  Today's Date: 04/29/2018 OT Individual Time: 1305-1402 OT Individual Time Calculation (min): 57 min    Short Term Goals: Week 2:  OT Short Term Goal 1 (Week 2): Pt will tolerate standing 5 mins in [place] to complete 2 grooming tasks with increased endurance OT Short Term Goal 2 (Week 2): Pt will complete LB dressing with max assist with use of AE OT Short Term Goal 3 (Week 2): Pt will complete toilet transfer with mod assist with LRAD  Skilled Therapeutic Interventions/Progress Updates:    Treatment session with focus on sit > stand and functional transfers.  Pt received on [facility] with nursing staff present.  Performed sit > stand in [place] with mod assist with increased time for weight shifting and to achieve full upright stance.  Pt propelled w/c to therapy gym with supervision for BUE strengthening and endurance.  Engaged in sit > stand from w/c with RW with increased time and mod assist to facilitate weight shift and lift off from seat.  Stand step transfer with RW min assist once upright.  Engaged in sit > partial stand x5 on pushup blocks with focus on strengthening with repetition.  Sit > stand with one UE on RW and other from push up block with mod assist and completed stand step transfer back to w/c.  Michaelyn Barter for transfer back to [facility] due to time constraints and left on [facility] with RN alerted to position.  Therapy Documentation Precautions:  Precautions Precautions: Fall, Back, Cervical Restrictions Weight Bearing Restrictions: No General:   Vital Signs: Therapy Vitals Temp: (!) 97.5 F (36.4 C) Temp Source: Oral Pulse Rate: 66 BP: (!) 149/68 Patient Position (if appropriate): Sitting Oxygen Therapy SpO2: 93 % Pain:  Pt with no c/o pain  See Function Navigator for Current Functional Status.   Therapy/Group: Individual Therapy  [name] 04/29/2018,  3:47 PM

## 2018-04-30 ENCOUNTER — Inpatient Hospital Stay (HOSPITAL_COMMUNITY): Payer: Medicare Other | Admitting: Physical Therapy

## 2018-04-30 ENCOUNTER — Inpatient Hospital Stay (HOSPITAL_COMMUNITY): Payer: Medicare Other | Admitting: Occupational Therapy

## 2018-04-30 LAB — URINE CULTURE: Culture: >=100000 — AB

## 2018-04-30 LAB — CBC WITH DIFFERENTIAL/PLATELET
BASOS ABS: 0 10*3/uL (ref 0.0–0.1)
Basophils Relative: 0 %
Eosinophils Absolute: 0.1 10*3/uL (ref 0.0–0.7)
Eosinophils Relative: 3 %
HEMATOCRIT: 32.3 % — AB (ref 39.0–52.0)
Hemoglobin: 10 g/dL — ABNORMAL LOW (ref 13.0–17.0)
LYMPHS PCT: 20 %
Lymphs Abs: 1 10*3/uL (ref 0.7–4.0)
MCH: 29.2 pg (ref 26.0–34.0)
MCHC: 31 g/dL (ref 30.0–36.0)
MCV: 94.4 fL (ref 78.0–100.0)
Monocytes Absolute: 0.3 10*3/uL (ref 0.1–1.0)
Monocytes Relative: 7 %
Neutro Abs: 3.5 10*3/uL (ref 1.7–7.7)
Neutrophils Relative %: 70 %
PLATELETS: 222 10*3/uL (ref 150–400)
RBC: 3.42 MIL/uL — AB (ref 4.22–5.81)
RDW: 16.6 % — ABNORMAL HIGH (ref 11.5–15.5)
WBC: 5 10*3/uL (ref 4.0–10.5)

## 2018-04-30 LAB — BASIC METABOLIC PANEL
Anion gap: 8 (ref 5–15)
BUN: 34 mg/dL — ABNORMAL HIGH (ref 6–20)
CO2: 37 mmol/L — ABNORMAL HIGH (ref 22–32)
Calcium: 8.6 mg/dL — ABNORMAL LOW (ref 8.9–10.3)
Chloride: 96 mmol/L — ABNORMAL LOW (ref 101–111)
Creatinine, Ser: 1.82 mg/dL — ABNORMAL HIGH (ref 0.61–1.24)
GFR, EST AFRICAN AMERICAN: 41 mL/min — AB (ref >60)
GFR, EST NON AFRICAN AMERICAN: 35 mL/min — AB (ref >60)
Glucose, Bld: 129 mg/dL — ABNORMAL HIGH (ref 65–99)
POTASSIUM: 3.7 mmol/L (ref 3.5–5.1)
SODIUM: 141 mmol/L (ref 135–145)

## 2018-04-30 LAB — GLUCOSE, CAPILLARY
GLUCOSE-CAPILLARY: 151 mg/dL — AB (ref 65–99)
GLUCOSE-CAPILLARY: 175 mg/dL — AB (ref 65–99)
Glucose-Capillary: 128 mg/dL — ABNORMAL HIGH (ref 65–99)
Glucose-Capillary: 122 mg/dL — ABNORMAL HIGH (ref 65–99)

## 2018-04-30 MED ORDER — PIPERACILLIN-TAZOBACTAM 3.375 G IVPB
3.3750 g | Freq: Three times a day (TID) | INTRAVENOUS | Status: AC
  Administered 2018-04-30 – 2018-05-07 (×22): 3.375 g via INTRAVENOUS
  Filled 2018-04-30 (×23): qty 50

## 2018-04-30 MED ORDER — HYDROCERIN EX CREA
TOPICAL_CREAM | CUTANEOUS | Status: DC
  Administered 2018-04-30 – 2018-05-07 (×2): via TOPICAL
  Filled 2018-04-30: qty 113

## 2018-04-30 MED ORDER — METOPROLOL TARTRATE 50 MG PO TABS
50.0000 mg | ORAL_TABLET | Freq: Two times a day (BID) | ORAL | Status: DC
  Administered 2018-04-30 – 2018-05-12 (×17): 50 mg via ORAL
  Filled 2018-04-30 (×23): qty 1

## 2018-04-30 NOTE — Progress Notes (Signed)
Progress Note  Patient Name: Travis [name] Date of Encounter: 04/30/2018  Primary Cardiologist: Mertie Moores, MD   Subjective   He still has dyspnea particularly with exertion.  No pain.  Requiring 2l [place] O2  Inpatient Medications    Scheduled Meds: . escitalopram  10 mg Oral Daily  . fesoterodine  8 mg Oral Daily  . gabapentin  400 mg Oral TID  . glimepiride  1 mg Oral Q breakfast  . hydrALAZINE  50 mg Oral Q8H  . hydrocerin   Topical Weekly  . insulin aspart  0-15 Units Subcutaneous TID WC  . levothyroxine  175 mcg Oral QAC breakfast  . metoprolol tartrate  50 mg Oral BID  . polyethylene glycol  17 g Oral Daily  . potassium chloride  20 mEq Oral BID  . pramipexole  0.5 mg Oral BID  . senna  2 tablet Oral BID  . tamsulosin  0.4 mg Oral Daily  . torsemide  80 mg Oral BID   Continuous Infusions: . piperacillin-tazobactam (ZOSYN)  IV Stopped (04/30/18 1308)   PRN Meds: acetaminophen **OR** acetaminophen, bisacodyl, menthol-cetylpyridinium **OR** phenol, ondansetron **OR** ondansetron (ZOFRAN) IV, ondansetron **OR** [DISCONTINUED] ondansetron (ZOFRAN) IV, sorbitol   Vital Signs    Vitals:   04/29/18 2003 04/29/18 2114 04/30/18 0400 04/30/18 0528  BP: (!) 142/61 (!) 145/61 (!) 131/58 139/60  Pulse: 65  60   Resp:      Temp:   98.5 F (36.9 C)   TempSrc:   Oral   SpO2:   93%   Weight:    281 lb 4.9 oz (127.6 kg)  Height:        Intake/Output Summary (Last 24 hours) at 04/30/2018 1345 Last data filed at 04/30/2018 1303 Gross per 24 hour  Intake 820 ml  Output 1300 ml  Net -480 ml   Filed Weights   04/28/18 0405 04/29/18 0527 04/30/18 0528  Weight: 288 lb 2.3 oz (130.7 kg) 280 lb 6.8 oz (127.2 kg) 281 lb 4.9 oz (127.6 kg)    Physical Exam   GEN: No  acute distress.   Neck: No  JVD Cardiac: RRR, no murmurs, rubs, or gallops.  Respiratory: Clear   to auscultation bilaterally. GI: Soft, nontender, non-distended, normal bowel sounds  MS:  Moderate edema; No  deformity. Neuro:   Nonfocal  Psych: Oriented and appropriate    Labs    Chemistry Recent Labs  Lab 04/28/18 0720 04/29/18 0647 04/30/18 0631  NA 143 142 141  K 3.9 3.8 3.7  CL 97* 96* 96*  CO2 38* 37* 37*  GLUCOSE 130* 148* 129*  BUN 32* 32* 34*  CREATININE 1.62* 1.77* 1.82*  CALCIUM 8.9 8.8* 8.6*  GFRNONAA 40* 36* 35*  GFRAA 47* 42* 41*  ANIONGAP 8 9 8      Hematology Recent Labs  Lab 04/30/18 0631  WBC 5.0  RBC 3.42*  HGB 10.0*  HCT 32.3*  MCV 94.4  MCH 29.2  MCHC 31.0  RDW 16.6*  PLT 222    Cardiac EnzymesNo results for input(s): TROPONINI in the last 168 hours. No results for input(s): TROPIPOC in the last 168 hours.   BNPNo results for input(s): BNP, PROBNP in the last 168 hours.   DDimer No results for input(s): DDIMER in the last 168 hours.   Radiology    No results found.  Cardiac Studies   Echocardiogram 02/2018: Study Conclusions  - Left ventricle: The cavity size was normal. Wall thickness was increased in a  pattern of mild LVH. Systolic function was vigorous. The estimated ejection fraction was in the range of 65% to 70%. Wall motion was normal; there were no regional wall motion abnormalities. Doppler parameters are consistent with pseudonormal left ventricular relaxation (grade 2 diastolic dysfunction). The E/A ratio is >1.5. The E/e&' ratio is >15, suggesting elevated LV filling pressure. - Left atrium: The atrium was mildly dilated. - Inferior vena cava: The vessel was normal in size. The respirophasic diameter changes were in the normal range (>= 50%), consistent with normal central venous pressure.  Impressions:  - Compared to a prior study in 2016, there is no significant change. LV filling pressure is elevated.   Patient Profile     72 y.o.malewith chronic diastolic heart failure, OSA, hypertension, diabetes and mild pulmonary hypertension admitted 04/08/18 for back surgery and developed increased  shortness of breath post operatively. Cardiology following for acute on chronic diastolic CHF.  Assessment & Plan    1. Acute on chronic diastolic CHF:        Continued edema and hypoxemia.  Therefore, despite the mildly increasing creat I would continue the current dose of diuretic and check the creat closely.  We will see him again on Monday. Call if there are questions over the weekend.   2. HTN:      BP under reasonable control.  Continue current therapy.   3. Acute on chronic renal insufficiency: Cr is mildly elevated today.  See above  4. Hypokalemia:   Corrected.  5. UTI:     Resistant organism being treated.     For questions or updates, please contact [place] Please consult [email or web address] for contact info under Cardiology/STEMI.      Signed, Minus Breeding, MD  04/30/2018, 1:45 PM   [phone]

## 2018-04-30 NOTE — Plan of Care (Signed)
  Problem: SCI BLADDER ELIMINATION Goal: RH STG MANAGE BLADDER WITH ASSISTANCE Description STG Manage Bladder With Assistance. Mod  Outcome: Progressing   Problem: RH SKIN INTEGRITY Goal: RH STG SKIN FREE OF INFECTION/BREAKDOWN Description Remain free of breakdown with mod assist  Outcome: Progressing Goal: RH STG MAINTAIN SKIN INTEGRITY WITH ASSISTANCE Description STG Maintain Skin Integrity With Assistance. Mod  Outcome: Progressing   Problem: SCI BOWEL ELIMINATION Goal: RH STG MANAGE BOWEL WITH ASSISTANCE Description STG Manage Bowel with mod assistance.  Outcome: Progressing Goal: RH STG SCI MANAGE BOWEL WITH MEDICATION WITH ASSISTANCE Description STG SCI Manage bowel with medication with assistance. Outcome: Progressing   Problem: RH SAFETY Goal: RH STG ADHERE TO SAFETY PRECAUTIONS W/ASSISTANCE/DEVICE Description STG Adhere to Safety Precautions With mod Assistance/Device.  Outcome: Progressing   Problem: RH KNOWLEDGE DEFICIT SCI Goal: RH STG INCREASE KNOWLEDGE OF SELF CARE AFTER SCI Description Increase knowledge of self care after SCI with mod assist  Outcome: Progressing   Problem: Consults Goal: RH SPINAL CORD INJURY PATIENT EDUCATION Description  See Patient Education module for education specifics.  Outcome: Progressing

## 2018-04-30 NOTE — Progress Notes (Signed)
Physical Therapy Weekly Progress Note  Patient Details  Name: Travis [name] MRN: [id number] Date of Birth: July 13, 1946  Beginning of progress report period: April 24, 2018 End of progress report period: Apr 30, 2018  Today's Date: 04/30/2018 PT Individual Time: 0800-0900 AND  PT Individual Time Calculation (min): 60 min   Patient has met 3 of 4 short term goals.  Pt making good progress towards Long term Goals, with significant improvements over the last 2 weeks. Pt has progressed from total assist at [facility] for all transfers and bed mobility to Currently requiring mod assist for bed mobility and sit<>stand transfers from slightly elevated seat height. Has progressed to ambulate ~64f with in assist and RW as well as >1569fin [facility]CM[facility] LLCropulsion with supervision assist from PT. LE edema, muscle weakness in BLE as well as poor cardiovascular support are this pt's greatest bar  Patient continues to demonstrate the following deficits muscle weakness and muscle joint tightness, decreased cardiorespiratoy endurance and decreased oxygen support and decreased standing balance, decreased balance strategies and difficulty maintaining precautions and therefore will continue to benefit from skilled PT intervention to increase functional independence with mobility.  Patient progressing toward long term goals..  Plan of care revisions: PT upgraded Pt goals to min assist as well as included ambulation goals due to pt progress. .  PT Short Term Goals Week 2:  PT Short Term Goal 1 (Week 2): Pt will perform sit<>stand with mod assist consistently  PT Short Term Goal 1 - Progress (Week 2): Met PT Short Term Goal 2 (Week 2): Pt will ambulate 1570fith LRAD and mod assist  PT Short Term Goal 2 - Progress (Week 2): Met PT Short Term Goal 3 (Week 2): Pt propell WC >150f78fthout rest and supervision assist consistently  PT Short Term Goal 3 - Progress (Week 2): Met PT Short Term Goal 4 (Week 2): Pt will perform bed  mobility with min assist  PT Short Term Goal 4 - Progress (Week 2): Progressing toward goal Week 3:  PT Short Term Goal 1 (Week 3): Pt will perform bed mobility with min assist  PT Short Term Goal 2 (Week 3): Pt will performed sit<>stand with min assist  PT Short Term Goal 3 (Week 3): Pt will bed<>WC transfer with min assist  PT Short Term Goal 4 (Week 3): Pt will ambulate 25ft37fh min assist and LRAD without desat <88%   Skilled Therapeutic Interventions/Progress Updates:   Session 1  Bed mobility from semirecumbent position with min assist from PT. Sitting balance EOB to don/doff shirt and brace without cues or assist from PT.    Sit<>stand to perform Clothing management with mod assist. Pt able to assist PT to pul pants to waist in standing with min assist for safety and 1 UE support. Stand pivot [facility] from elevated bed height.    WC mobility instructed by PT with supervision assist x 150ft,35fmaintained on 1 L.min O2 with desat to 84%.   Sit<>stand and stand pivot transfers [facility] x 4 throughout treatment with mod assist and increased time to allow anterior weight shift. Pt continues to be limited due to BLE edema, preventing increased knee flexion.   Gait training x 20ft w60fRW and min assist from PT Pt initially on 1L/min O2 and noted to desat to 83%. Increased to 2 L/min and only increased to 85%;  3L/min to maintain >88% with gait.   Patient returned  to room and left sitting in [facility] with call bell in reach and all needs met.    Session 2.  Pt received sitting in WC and agreeable to PT. WC mobility to rehab gym on 2 L/min O2 and pt noted to remain >88%. BUE therex to strengthen chest and improve cardio vascular endurance chest press/shoulder press with 5# weight bar x 15, arm shuttle 3 x 1 min with rest break between bouts. Press ups from [facility], 2 x 5 cues for posture, set up and proper pused lip breathing. . Patient returned to room and left  sitting in [facility] with call bell in reach and all needs met.         Therapy Documentation Precautions:  Precautions Precautions: Fall, Back, Cervical Restrictions Weight Bearing Restrictions: No General:   Vital Signs: Therapy Vitals BP: 139/60 Pain:   denies   See Function Navigator for Current Functional Status.  Therapy/Group: Individual Therapy  Lorie Phenix 04/30/2018, 9:06 AM

## 2018-04-30 NOTE — Progress Notes (Signed)
Orthopedic Tech Progress Note Patient Details:  Travis [name] November 05, 1946 [id number]  Ortho Devices Type of Ortho Device: Haematologist Ortho Device/Splint Location: bilateral Ortho Device/Splint Interventions: Application   Post Interventions Patient Tolerated: Well Instructions Provided: Care of device   [name] 04/30/2018, 11:05 AM

## 2018-04-30 NOTE — Progress Notes (Signed)
Subjective/Complaints: Patient seen ng in bed this morning. He states he slept well overnight. He states he started his IV antibiotics yesterday. He is not wearing unna boots.  ROS: denies CP, SOB, N/V/D.   Objective: Vital Signs: Blood pressure 139/60, pulse 60, temperature 98.5 F (36.9 C), temperature source Oral, resp. rate 18, height 6' 1"  (1.854 m), weight 127.6 kg (281 lb 4.9 oz), SpO2 93 %. No results found. Results for orders placed or performed during the hospital encounter of 04/16/18 (from the past 72 hour(s))  Glucose, capillary     Status: Abnormal   Collection Time: 04/27/18 11:38 AM  Result Value Ref Range   Glucose-Capillary 142 (H) 65 - 99 mg/dL  Glucose, capillary     Status: Abnormal   Collection Time: 04/27/18  4:35 PM  Result Value Ref Range   Glucose-Capillary 148 (H) 65 - 99 mg/dL  Glucose, capillary     Status: Abnormal   Collection Time: 04/27/18  8:54 PM  Result Value Ref Range   Glucose-Capillary 223 (H) 65 - 99 mg/dL  Glucose, capillary     Status: Abnormal   Collection Time: 04/28/18  6:52 AM  Result Value Ref Range   Glucose-Capillary 120 (H) 65 - 99 mg/dL  Basic metabolic panel     Status: Abnormal   Collection Time: 04/28/18  7:20 AM  Result Value Ref Range   Sodium 143 135 - 145 mmol/L   Potassium 3.9 3.5 - 5.1 mmol/L   Chloride 97 (L) 101 - 111 mmol/L   CO2 38 (H) 22 - 32 mmol/L   Glucose, Bld 130 (H) 65 - 99 mg/dL   BUN 32 (H) 6 - 20 mg/dL   Creatinine, Ser 1.62 (H) 0.61 - 1.24 mg/dL   Calcium 8.9 8.9 - 10.3 mg/dL   GFR calc non Af Amer 40 (L) >60 mL/min   GFR calc Af Amer 47 (L) >60 mL/min    Comment: (NOTE) The eGFR has been calculated using the CKD EPI equation. This calculation has not been validated in all clinical situations. eGFR's persistently <60 mL/min signify possible Chronic Kidney Disease.    Anion gap 8 5 - 15    Comment: Performed at [facility] [address] [place], [place] [address]  Glucose, capillary      Status: Abnormal   Collection Time: 04/28/18 11:58 AM  Result Value Ref Range   Glucose-Capillary 145 (H) 65 - 99 mg/dL   Comment 1 Notify RN   Glucose, capillary     Status: Abnormal   Collection Time: 04/28/18  4:25 PM  Result Value Ref Range   Glucose-Capillary 152 (H) 65 - 99 mg/dL  Glucose, capillary     Status: Abnormal   Collection Time: 04/28/18  9:18 PM  Result Value Ref Range   Glucose-Capillary 206 (H) 65 - 99 mg/dL  Glucose, capillary     Status: Abnormal   Collection Time: 04/29/18  6:27 AM  Result Value Ref Range   Glucose-Capillary 130 (H) 65 - 99 mg/dL  Basic metabolic panel     Status: Abnormal   Collection Time: 04/29/18  6:47 AM  Result Value Ref Range   Sodium 142 135 - 145 mmol/L   Potassium 3.8 3.5 - 5.1 mmol/L   Chloride 96 (L) 101 - 111 mmol/L   CO2 37 (H) 22 - 32 mmol/L   Glucose, Bld 148 (H) 65 - 99 mg/dL   BUN 32 (H) 6 - 20 mg/dL   Creatinine, Ser 1.77 (  H) 0.61 - 1.24 mg/dL   Calcium 8.8 (L) 8.9 - 10.3 mg/dL   GFR calc non Af Amer 36 (L) >60 mL/min   GFR calc Af Amer 42 (L) >60 mL/min    Comment: (NOTE) The eGFR has been calculated using the CKD EPI equation. This calculation has not been validated in all clinical situations. eGFR's persistently <60 mL/min signify possible Chronic Kidney Disease.    Anion gap 9 5 - 15    Comment: Performed at [facility] [address], [place], [place] [address]  Glucose, capillary     Status: Abnormal   Collection Time: 04/29/18 11:27 AM  Result Value Ref Range   Glucose-Capillary 170 (H) 65 - 99 mg/dL   Comment 1 Notify RN   Glucose, capillary     Status: Abnormal   Collection Time: 04/29/18  4:30 PM  Result Value Ref Range   Glucose-Capillary 166 (H) 65 - 99 mg/dL  Glucose, capillary     Status: Abnormal   Collection Time: 04/29/18  9:12 PM  Result Value Ref Range   Glucose-Capillary 168 (H) 65 - 99 mg/dL  Glucose, capillary     Status: Abnormal   Collection Time: 04/30/18  5:26 AM  Result  Value Ref Range   Glucose-Capillary 128 (H) 65 - 99 mg/dL  CBC with Differential/Platelet     Status: Abnormal   Collection Time: 04/30/18  6:31 AM  Result Value Ref Range   WBC 5.0 4.0 - 10.5 K/uL   RBC 3.42 (L) 4.22 - 5.81 MIL/uL   Hemoglobin 10.0 (L) 13.0 - 17.0 g/dL   HCT 32.3 (L) 39.0 - 52.0 %   MCV 94.4 78.0 - 100.0 fL   MCH 29.2 26.0 - 34.0 pg   MCHC 31.0 30.0 - 36.0 g/dL   RDW 16.6 (H) 11.5 - 15.5 %   Platelets 222 150 - 400 K/uL   Neutrophils Relative % 70 %   Neutro Abs 3.5 1.7 - 7.7 K/uL   Lymphocytes Relative 20 %   Lymphs Abs 1.0 0.7 - 4.0 K/uL   Monocytes Relative 7 %   Monocytes Absolute 0.3 0.1 - 1.0 K/uL   Eosinophils Relative 3 %   Eosinophils Absolute 0.1 0.0 - 0.7 K/uL   Basophils Relative 0 %   Basophils Absolute 0.0 0.0 - 0.1 K/uL    Comment: Performed at [facility], [address], [place], [place] [address]    Constitutional: No distress . Vital signs reviewed. HENT: Normocephalic.  Atraumatic. Eyes: EOMI. No discharge. Cardiovascular: RRR. No JVD. Respiratory: CTA Bilaterally. Normal effort. GI: BS +. Non-distended. Musc/Skel:  LE edema. No tenderness. Neuro: Alert and Oriented  Motor: 5/5 BUE proximal to distal 3-/5 BLE HF, 4-/5 distally  Skin: ulcer on left distal tibia healing Surgical incision with dressing C/D/I  Assessment/Plan: 1. 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 Physiatrist is providing close team supervision and 24 hour management of active medical problems listed below. Physiatrist and rehab team continue to assess barriers to discharge/monitor patient progress toward functional and medical goals. FIM: Function - Bathing Position: Sitting EOB Body parts bathed by patient: Right arm, Left arm, Chest, Abdomen, Front perineal area, Right upper leg, Left upper leg Body parts bathed by helper: Buttocks, Right lower  leg, Left lower leg, Back Bathing not applicable: Left lower leg, Right lower leg(Unna boots) Assist Level: (Mod assist)  Function- Upper Body Dressing/Undressing  What is the patient wearing?: Pull over shirt/dress Pull over shirt/dress - Perfomed by patient: Thread/unthread right sleeve, Thread/unthread left sleeve, Put head through opening, Pull shirt over trunk Assist Level: Set up Function - Lower Body Dressing/Undressing What is the patient wearing?: Pants, Non-skid slipper socks, Ted Hose Position: Sitting EOB(sit > stand in [place]) Pants- Performed by helper: Thread/unthread right pants leg, Thread/unthread left pants leg, Pull pants up/down Non-skid slipper socks- Performed by helper: Don/doff right sock, Don/doff left sock TED Hose - Performed by helper: Don/doff right TED hose, Don/doff left TED hose Assist for footwear: Dependant Assist for lower body dressing: (Total assist)  Function - Toileting Toileting activity did not occur: No continent bowel/bladder event Toileting steps completed by patient: Adjust clothing prior to toileting, Performs perineal hygiene, Adjust clothing after toileting Toileting steps completed by helper: Adjust clothing prior to toileting, Performs perineal hygiene, Adjust clothing after toileting Toileting Assistive Devices: Grab bar or rail, Toilet aid Assist level: Touching or steadying assistance (Pt.75%)  Function - Air cabin crew transfer activity did not occur: Safety/medical concerns Toilet transfer assistive device: Bedside commode, Mechanical lift Mechanical lift: Stedy Assist level to toilet: Maximal assist (Pt 25 - 49%/lift and lower) Assist level from toilet: Maximal assist (Pt 25 - 49%/lift and lower) Assist level to bedside commode (at bedside): Moderate assist (Pt 50 - 74%/lift or lower) Assist level from bedside commode (at bedside): Moderate assist (Pt 50 - 74%/lift or lower)  Function - Chair/bed transfer Chair/bed  transfer method: Lateral scoot Chair/bed transfer assist level: Touching or steadying assistance (Pt > 75%) Chair/bed transfer assistive device: Sliding board Mechanical lift: Clarise Cruz Chair/bed transfer details: Verbal cues for safe use of DME/AE, Manual facilitation for placement, Manual facilitation for weight shifting, Verbal cues for precautions/safety  Function - Locomotion: Wheelchair Will patient use wheelchair at discharge?: Yes Type: Manual Max wheelchair distance: 150 Assist Level: Supervision or verbal cues Wheel 50 feet with 2 turns activity did not occur: Safety/medical concerns Assist Level: Supervision or verbal cues Wheel 150 feet activity did not occur: Safety/medical concerns Assist Level: Supervision or verbal cues Turns around,maneuvers to table,bed, and toilet,negotiates 3% grade,maneuvers on rugs and over doorsills: No Function - Locomotion: Ambulation Ambulation activity did not occur: [name] Assistive device: Parallel bars Max distance: 16' Assist level: Touching or steadying assistance (Pt > 75%) Assist level: Touching or steadying assistance (Pt > 75%)  Function - Comprehension Comprehension: Auditory Comprehension assist level: Follows basic conversation/direction with extra time/assistive device  Function - Expression Expression: Verbal Expression assist level: Expresses complex 90% of the time/cues < 10% of the time  Function - Social Interaction Social Interaction assist level: Interacts appropriately 90% of the time - Needs monitoring or encouragement for participation or interaction.  Function - Problem Solving Problem solving assist level: Solves basic problems with no assist  Function - Memory Memory assist level: Recognizes or recalls 90% of the time/requires cueing < 10% of the time Patient normally able to recall (first 3 days only): Current season, Location of own room, Staff names and faces, That he or she is in a hospital  Medical Problem  List and Plan: 1.  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  DVT Prophylaxis/Anticoagulation: SCDs.      Vascular study negative but unna boots limited study 3. Pain Management: Neurontin 400 mg 3 times daily 4. Mood: Lexapro 10 mg daily 5. Neuropsych: This patient is capable of making decisions  on his own behalf. 6. Skin/Wound Care: Pressure relief measures.    Keflex started on 5/1 for surgical wound drainage 7. Fluids/Electrolytes/Nutrition: Strict I/Os. Heart healthy diet. Monitor voiding function.  8. OSA with acute hypercarbic respiratory failure: Has to use BIPAP or CPAP at nights 9. Acute on chronic diastolic CHF:  Improved with lasix which has been discontinued and changed to [facility] which has also been stopped.  Hydralazine dose adjusted. Monitor weights daily with strict I/Os.       Filed Weights   04/28/18 0405 04/29/18 0527 04/30/18 0528  Weight: 130.7 kg (288 lb 2.3 oz) 127.2 kg (280 lb 6.8 oz) 127.6 kg (281 lb 4.9 oz)    Stable on 5/3  Managed by cards, appreciate recs 10 Acute on chronic renal failure.   Hydralazine 50 mg 3 times daily   Hold ARB per Cards   Cr 1.77 on 5/2  Encourage appropriate fluid intake 11. Morbid obesity: BMI 40.46.  12. Hypoxic encephalopathy:   Resolved    limit sedating medications. Encourage IS. CPAP whenever asleep. 13.  Hypertension.  Lopressor 25 mg twice daily, increased to 50 BID on 5/3.    Monitor with increased mobility 14.  BPH.  Flomax 0.4 mg daily, Toviaz 8 mg daily.   15.  Diabetes mellitus with peripheral neuropathy.  Hemoglobin A1c 7.7.  SSI.  Check blood sugars before meals and at bedtime   On amaryl at home, resumed. Titrate regimen as needed   Slightly labile on 5/3 16.  Fevers.  Resolved. Blood cultures no growth.  Urine culture negative. Completed course of Omnicef 17.  Hypothyroidism.  Synthroid 18.  Constipation.   Laxative assistance, increased Senokot to 2 tablets twice a day, continue MiraLAX 1 packet/day   Dulcolax prn 19. Hypokalemia:   Potassium 3.8 on 5/2   Continue supplementation   Cont to monitor 20. Acute blood loss anemia  Hemoglobin 10.0 on 5/3  Continue to monitor 21. Acute lower UTI  UA+, Ucx with greater than 100,000 Pseudomonas and greater than 40,000 Klebsiella, sensitivities reviewed, attended content pharmacy-no answer, will call back  Keflex changed to cefepime , Changed to Zosyn on 5/3 due to multidrug resistances  LOS (Days) 14 A FACE TO FACE EVALUATION WAS PERFORMED  Kapil Petropoulos Lorie Phenix 04/30/2018, 7:57 AM

## 2018-04-30 NOTE — Progress Notes (Signed)
Pharmacy Antibiotic Note  Travis [name] is a 72 y.o. male admitted s/p recent lumbar surgery on 4/11 developed respiratory failure transferred to CIR on 04/16/2018. Pt with surgical wound drainage and UTI started on keflex on 5/1 which was changed to cefepime last night. Urine culture is positive for PAS and klebsiella ornithinolytica. Both are sensitive to zosyn. Pharmacy is consulted to switch abx to zosyn.  Scr slightly trending up 1.82 today, est. crcl ~ 50 ml/min  Plan: Zosyn 3.375g IV Q 8 hrs (4hr infusion) Monitor renal function   Height: 6\' 1"  (185.4 cm) Weight: 281 lb 4.9 oz (127.6 kg) IBW/kg (Calculated) : 79.9  Temp (24hrs), Avg:98.5 F (36.9 C), Min:98.5 F (36.9 C), Max:98.5 F (36.9 C)  Recent Labs  Lab 04/26/18 0547 04/27/18 0720 04/28/18 0720 04/29/18 0647 04/30/18 0631  WBC  --   --   --   --  5.0  CREATININE 1.55* 1.60* 1.62* 1.77* 1.82*    Estimated Creatinine Clearance: 50.6 mL/min (A) (by C-G formula based on SCr of 1.82 mg/dL (H)).    Allergies  Allergen Reactions  . Other Other (See Comments)    ANESTHESIA UNSPECIFIED  -- makes sick, must have antinausea medication in advance.  . Levofloxacin In D5w Itching    Possible infusion reaction 03/29/17. Tolerating oral Levaquin 03/30/17    Antimicrobials this admission: 5/1 Keflex >> 5/2 5/2 Cefepime >> 5/3 5/3 Zosyn >>   4/14 Vanc>>4/15 4/14 Zosyn >>4/16 4/16 cefdinir >>4/17  Microbiology results: 4/29 Urine - 100K colonies PSA (S-cefepime, fortaz, imi, zosyn)                      40K colonies klebsiella ornithinolytica (S- cipro, imi, nitro, zosyn)  4/14 blood x 2 >> 4/14 urine neg    Thank you for allowing [place] to participate in this patients care.  Maryanna Shape, PharmD, BCPS  Clinical Pharmacist  Pager: [phone]   04/30/2018 3:24 PM

## 2018-04-30 NOTE — Progress Notes (Signed)
Occupational Therapy Weekly Progress Note  Patient Details  Name: Travis [name] MRN: [id number] Date of Birth: Apr 20, 1946  Beginning of progress report period: April 23, 2018 End of progress report period: Apr 30, 2018  Today's Date: 04/30/2018 OT Individual Time: 6237-6283 and 1517-6160 OT Individual Time Calculation (min): 55 min and 42 min   Patient has met 1 of 3 short term goals and partly met another goal. Pt is making steady progress towards goals.  Pt currently requires mod assist sit > stand and min assist once upright for standing balance for 4-5 mins with alternating UE support.  Pt can complete transfer with slide board with min assist and placement of slide board.  Pt is unable to complete lateral leans for clothing management for LB dressing or toileting, therefore have spent increased time on sit > stand and standing balance to decrease burden of care with LB self-care tasks.  Pt has voiced decreased desire to work with AE to decrease burden of care with LB self-care tasks, however will need to begin re-introducing to increase pt participation with LB self-care tasks.  Pt continues to require 1.5-2L supplemental O2 as he desats to mid 80s with activity with and without supplemental O2.  Patient continues to demonstrate the following deficits:  muscle weakness and muscle joint tightness, decreased cardiorespiratoy endurance and decreased oxygen support, and decreased standing balance, decreased balance strategies and difficulty maintaining precautions and therefore will continue to benefit from skilled OT intervention to enhance overall performance with BADL and Reduce care partner burden.  Patient progressing toward long term goals..  Continue plan of care.  OT Short Term Goals Week 2:  OT Short Term Goal 1 (Week 2): Pt will tolerate standing 5 mins in Stedy to complete 2 grooming tasks with increased endurance OT Short Term Goal 1 - Progress (Week 2): Partly met OT Short Term  Goal 2 (Week 2): Pt will complete LB dressing with max assist with use of AE OT Short Term Goal 2 - Progress (Week 2): Progressing toward goal OT Short Term Goal 3 (Week 2): Pt will complete toilet transfer with mod assist with LRAD OT Short Term Goal 3 - Progress (Week 2): Met Week 3:  OT Short Term Goal 1 (Week 3): Pt will complete bathing with mod assist with use of AE  OT Short Term Goal 2 (Week 3): Pt will complete LB dressing with max assist with use of AE OT Short Term Goal 3 (Week 3): Pt will complete 1/3 toileting steps to decrease burden of care OT Short Term Goal 4 (Week 3): Pt will complete 1 grooming task in standing to increase standing tolerance  Skilled Therapeutic Interventions/Progress Updates:    1) Treatment session with focus on sit > stand and standing balance to decrease burden of care with self-care tasks.  Pt received upright in w/c reporting already dressed for the day.  Pt propelled w/c 150' to therapy gym with supervision/setup for leg rests.  Engaged in sit > stand from w/c with mod assist and completed stand pivot transfer to therapy mat with min assist.  Engaged in blocked practice sit > stand from elevated mat height progressing down to 22.5" with pt requiring mod (lifting) assist for sit > stand.  Pt demonstrating improved weight shift and ability to clear buttocks from mat, however continues to require manual facilitation for weight shift during transition of hands from seated surface to RW.  Stand pivot transfer back to w/c min assist.  Pt O2 dropped  to 83% on 2L O2 during transfers and 87-88% with sit > stand; returning to 91-92% with 1 min of pursed lip breathing.  Pt left upright in w/c with legs elevated on chair for edema management.  2) Treatment session with focus on LB dressing with use of AE.  Pt received upright in w/c with wife present.  Engaged in blocked practice of simulated LB dressing with use of theraband to simulate waste band and use of reacher.  Pt  able to thread BLE into theraband with use of reacher to maintain back precautions.  Completed sit > stand with mod assist with improved clearance of buttocks and weight shift and then pt able to alternate UE support with min assist to pull "pants" over hips.  Pt demonstrating improvement with dynamic standing balance throughout activity.  O2 dropped to 86% on 1.5L O2 during self-care retraining activity, would return to 90-92 with cues for breathing technique.  Pt left upright in w/c with all needs in reach.  Therapy Documentation Precautions:  Precautions Precautions: Fall, Back, Cervical Restrictions Weight Bearing Restrictions: No Pain: Pain Assessment Pain Scale: 0-10 Pain Score: 0-No pain  See Function Navigator for Current Functional Status.   Therapy/Group: Individual Therapy  [name] 04/30/2018, 12:09 PM

## 2018-05-01 ENCOUNTER — Inpatient Hospital Stay (HOSPITAL_COMMUNITY): Payer: Medicare Other

## 2018-05-01 ENCOUNTER — Inpatient Hospital Stay (HOSPITAL_COMMUNITY): Payer: Medicare Other | Admitting: Physical Therapy

## 2018-05-01 DIAGNOSIS — R0989 Other specified symptoms and signs involving the circulatory and respiratory systems: Secondary | ICD-10-CM

## 2018-05-01 LAB — BASIC METABOLIC PANEL
Anion gap: 11 (ref 5–15)
BUN: 32 mg/dL — ABNORMAL HIGH (ref 6–20)
CHLORIDE: 95 mmol/L — AB (ref 101–111)
CO2: 37 mmol/L — ABNORMAL HIGH (ref 22–32)
Calcium: 8.9 mg/dL (ref 8.9–10.3)
Creatinine, Ser: 1.8 mg/dL — ABNORMAL HIGH (ref 0.61–1.24)
GFR calc Af Amer: 41 mL/min — ABNORMAL LOW (ref >60)
GFR calc non Af Amer: 36 mL/min — ABNORMAL LOW (ref >60)
Glucose, Bld: 111 mg/dL — ABNORMAL HIGH (ref 65–99)
POTASSIUM: 3.9 mmol/L (ref 3.5–5.1)
Sodium: 143 mmol/L (ref 135–145)

## 2018-05-01 LAB — GLUCOSE, CAPILLARY
GLUCOSE-CAPILLARY: 111 mg/dL — AB (ref 65–99)
GLUCOSE-CAPILLARY: 174 mg/dL — AB (ref 65–99)
GLUCOSE-CAPILLARY: 128 mg/dL — AB (ref 65–99)
Glucose-Capillary: 133 mg/dL — ABNORMAL HIGH (ref 65–99)

## 2018-05-01 NOTE — Progress Notes (Signed)
Occupational Therapy Session Note  Patient Details  Name: Travis [name] MRN: [id number] Date of Birth: Jul 04, 1946  Today's Date: 05/01/2018 OT Individual Time: 2103-1281 OT Individual Time Calculation (min): 42 min    Short Term Goals: Week 1:  OT Short Term Goal 1 (Week 1): Pt will maintain standing tolerance in stedy for20 minutes while performing grooming at sink OT Short Term Goal 1 - Progress (Week 1): Revised due to lack of progress OT Short Term Goal 2 (Week 1): Pt will perform diaphramitic breathing strategies with 1 questioning cue OT Short Term Goal 2 - Progress (Week 1): Met OT Short Term Goal 3 (Week 1): Pt will transfer from sit to stand from elevated surface with max assist to prepare for donning pants. OT Short Term Goal 3 - Progress (Week 1): Met OT Short Term Goal 4 (Week 1): Pt will verbalize 3/3 back precautions with no cues.   OT Short Term Goal 4 - Progress (Week 1): Met  Skilled Therapeutic Interventions/Progress Updates:    1:1. Pt complete supine>sitting EOB with supervision and VC for maintaining back precautions. Pt dons brace with superviisona nd VC for noticing twist in brace as well as application sequencing. Pt completes 3 sit to stand from EOB with MAX A and VC for bringing feet under knees as OT doffs/dons new shorts with total A. Pt propels w/c to gym wit increased time. Rn alerted as IV seems to be bleeding and RN permits therapy to continue. Pt stands with lifting A to reach laterally to place clothes pins on anterior target at head height to encourage chest elevation and upright posture as well as standing tolerance. Exited session with pt seated in w/c, call light in reach and all needs met  Therapy Documentation Precautions:  Precautions Precautions: Fall, Back, Cervical Restrictions Weight Bearing Restrictions: No General:   Vital Signs: Therapy Vitals BP: (!) 129/56  See Function Navigator for Current Functional Status.   Therapy/Group:  Individual Therapy  Tonny Branch 05/01/2018, 9:59 AM

## 2018-05-01 NOTE — Procedures (Signed)
Patient has home CPAP beside and ready for use. Patient places on self when ready for bed.

## 2018-05-01 NOTE — Progress Notes (Signed)
Subjective/Complaints: Patient seen lying in bed this morning. He states he slept well overnight. He has questions regarding improvement in lower extremity edema.  ROS: Denies CP, SOB, N/V/D.   Objective: Vital Signs: Blood pressure (!) 129/56, pulse 67, temperature 98.5 F (36.9 C), temperature source Oral, resp. rate 18, height _0  (1.854 m), weight 126.7 kg (279 lb 5.2 oz), SpO2 (!) 88 %. No results found. Results for orders placed or performed during the hospital encounter of 04/16/18 (from the past 72 hour(s))  Glucose, capillary     Status: Abnormal   Collection Time: 04/28/18  4:25 PM  Result Value Ref Range   Glucose-Capillary 152 (H) 65 - 99 mg/dL  Glucose, capillary     Status: Abnormal   Collection Time: 04/28/18  9:18 PM  Result Value Ref Range   Glucose-Capillary 206 (H) 65 - 99 mg/dL  Glucose, capillary     Status: Abnormal   Collection Time: 04/29/18  6:27 AM  Result Value Ref Range   Glucose-Capillary 130 (H) 65 - 99 mg/dL  Basic metabolic panel     Status: Abnormal   Collection Time: 04/29/18  6:47 AM  Result Value Ref Range   Sodium 142 135 - 145 mmol/L   Potassium 3.8 3.5 - 5.1 mmol/L   Chloride 96 (L) 101 - 111 mmol/L   CO2 37 (H) 22 - 32 mmol/L   Glucose, Bld 148 (H) 65 - 99 mg/dL   BUN 32 (H) 6 - 20 mg/dL   Creatinine, Ser 1.77 (H) 0.61 - 1.24 mg/dL   Calcium 8.8 (L) 8.9 - 10.3 mg/dL   GFR calc non Af Amer 36 (L) >60 mL/min   GFR calc Af Amer 42 (L) >60 mL/min    Comment: (NOTE) The eGFR has been calculated using the CKD EPI equation. This calculation has not been validated in all clinical situations. eGFR's persistently <60 mL/min signify possible Chronic Kidney Disease.    Anion gap 9 5 - 15    Comment: Performed at [facility] [address], [place], [place] [address]  Glucose, capillary     Status: Abnormal   Collection Time: 04/29/18 11:27 AM  Result Value Ref Range   Glucose-Capillary 170 (H) 65 - 99 mg/dL   Comment 1 Notify RN    Glucose, capillary     Status: Abnormal   Collection Time: 04/29/18  4:30 PM  Result Value Ref Range   Glucose-Capillary 166 (H) 65 - 99 mg/dL  Glucose, capillary     Status: Abnormal   Collection Time: 04/29/18  9:12 PM  Result Value Ref Range   Glucose-Capillary 168 (H) 65 - 99 mg/dL  Glucose, capillary     Status: Abnormal   Collection Time: 04/30/18  5:26 AM  Result Value Ref Range   Glucose-Capillary 128 (H) 65 - 99 mg/dL  Basic metabolic panel     Status: Abnormal   Collection Time: 04/30/18  6:31 AM  Result Value Ref Range   Sodium 141 135 - 145 mmol/L   Potassium 3.7 3.5 - 5.1 mmol/L   Chloride 96 (L) 101 - 111 mmol/L   CO2 37 (H) 22 - 32 mmol/L   Glucose, Bld 129 (H) 65 - 99 mg/dL   BUN 34 (H) 6 - 20 mg/dL   Creatinine, Ser 1.82 (H) 0.61 - 1.24 mg/dL   Calcium 8.6 (L) 8.9 - 10.3 mg/dL   GFR calc non Af Amer 35 (L) >60 mL/min   GFR calc Af Amer 41 (  L) >60 mL/min    Comment: (NOTE) The eGFR has been calculated using the CKD EPI equation. This calculation has not been validated in all clinical situations. eGFR's persistently <60 mL/min signify possible Chronic Kidney Disease.    Anion gap 8 5 - 15    Comment: Performed at [facility] [address] [place], [place] [address]  CBC with Differential/Platelet     Status: Abnormal   Collection Time: 04/30/18  6:31 AM  Result Value Ref Range   WBC 5.0 4.0 - 10.5 K/uL   RBC 3.42 (L) 4.22 - 5.81 MIL/uL   Hemoglobin 10.0 (L) 13.0 - 17.0 g/dL   HCT 32.3 (L) 39.0 - 52.0 %   MCV 94.4 78.0 - 100.0 fL   MCH 29.2 26.0 - 34.0 pg   MCHC 31.0 30.0 - 36.0 g/dL   RDW 16.6 (H) 11.5 - 15.5 %   Platelets 222 150 - 400 K/uL   Neutrophils Relative % 70 %   Neutro Abs 3.5 1.7 - 7.7 K/uL   Lymphocytes Relative 20 %   Lymphs Abs 1.0 0.7 - 4.0 K/uL   Monocytes Relative 7 %   Monocytes Absolute 0.3 0.1 - 1.0 K/uL   Eosinophils Relative 3 %   Eosinophils Absolute 0.1 0.0 - 0.7 K/uL   Basophils Relative 0 %   Basophils  Absolute 0.0 0.0 - 0.1 K/uL    Comment: Performed at [facility] [address] [place], [place] [address]  Glucose, capillary     Status: Abnormal   Collection Time: 04/30/18 12:02 PM  Result Value Ref Range   Glucose-Capillary 122 (H) 65 - 99 mg/dL  Glucose, capillary     Status: Abnormal   Collection Time: 04/30/18  4:32 PM  Result Value Ref Range   Glucose-Capillary 151 (H) 65 - 99 mg/dL  Glucose, capillary     Status: Abnormal   Collection Time: 04/30/18  8:57 PM  Result Value Ref Range   Glucose-Capillary 175 (H) 65 - 99 mg/dL  Basic metabolic panel     Status: Abnormal   Collection Time: 05/01/18  5:37 AM  Result Value Ref Range   Sodium 143 135 - 145 mmol/L   Potassium 3.9 3.5 - 5.1 mmol/L   Chloride 95 (L) 101 - 111 mmol/L   CO2 37 (H) 22 - 32 mmol/L   Glucose, Bld 111 (H) 65 - 99 mg/dL   BUN 32 (H) 6 - 20 mg/dL   Creatinine, Ser 1.80 (H) 0.61 - 1.24 mg/dL   Calcium 8.9 8.9 - 10.3 mg/dL   GFR calc non Af Amer 36 (L) >60 mL/min   GFR calc Af Amer 41 (L) >60 mL/min    Comment: (NOTE) The eGFR has been calculated using the CKD EPI equation. This calculation has not been validated in all clinical situations. eGFR's persistently <60 mL/min signify possible Chronic Kidney Disease.    Anion gap 11 5 - 15    Comment: Performed at [facility] [address], [place], [place] [address]  Glucose, capillary     Status: Abnormal   Collection Time: 05/01/18  6:03 AM  Result Value Ref Range   Glucose-Capillary 111 (H) 65 - 99 mg/dL  Glucose, capillary     Status: Abnormal   Collection Time: 05/01/18 11:59 AM  Result Value Ref Range   Glucose-Capillary 174 (H) 65 - 99 mg/dL    Constitutional: No distress . Vital signs reviewed. HENT: Normocephalic.  Atraumatic. Eyes: EOMI.  No discharge. Cardiovascular: RRR. No JVD. Respiratory: CTA Bilaterally. Normal effort. GI: BS +. Non-distended. Musc/Skel:  LE edema. No tenderness. Neuro: Alert and Oriented  Motor:  5/5 BUE proximal to distal BLE 3-/5 HF, 4-/5 distally  Skin: Unna boots to bilateral lower extremities Surgical incision with dressing C/D/I  Assessment/Plan: 1. 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 Physiatrist is providing close team supervision and 24 hour management of active medical problems listed below. Physiatrist and rehab team continue to assess barriers to discharge/monitor patient progress toward functional and medical goals. FIM: Function - Bathing Position: Sitting EOB Body parts bathed by patient: Right arm, Left arm, Chest, Abdomen, Front perineal area, Right upper leg, Left upper leg Body parts bathed by helper: Buttocks, Right lower leg, Left lower leg, Back Bathing not applicable: Left lower leg, Right lower leg(Unna boots) Assist Level: (Mod assist)  Function- Upper Body Dressing/Undressing What is the patient wearing?: Pull over shirt/dress Pull over shirt/dress - Perfomed by patient: Thread/unthread right sleeve, Thread/unthread left sleeve, Put head through opening, Pull shirt over trunk Assist Level: Set up Function - Lower Body Dressing/Undressing What is the patient wearing?: Pants, Non-skid slipper socks, Ted Hose Position: Sitting EOB(sit > stand in [place]) Pants- Performed by helper: Thread/unthread right pants leg, Thread/unthread left pants leg, Pull pants up/down Non-skid slipper socks- Performed by helper: Don/doff right sock, Don/doff left sock TED Hose - Performed by helper: Don/doff right TED hose, Don/doff left TED hose Assist for footwear: Dependant Assist for lower body dressing: (Total assist)  Function - Toileting Toileting activity did not occur: No continent bowel/bladder event Toileting steps completed by patient: Adjust clothing prior to toileting, Performs perineal hygiene, Adjust clothing after toileting Toileting steps completed  by helper: Adjust clothing prior to toileting, Performs perineal hygiene, Adjust clothing after toileting Toileting Assistive Devices: Grab bar or rail, Toilet aid Assist level: Touching or steadying assistance (Pt.75%)  Function - Air cabin crew transfer activity did not occur: Safety/medical concerns Toilet transfer assistive device: Bedside commode, Mechanical lift Mechanical lift: Stedy Assist level to toilet: Maximal assist (Pt 25 - 49%/lift and lower) Assist level from toilet: Maximal assist (Pt 25 - 49%/lift and lower) Assist level to bedside commode (at bedside): Moderate assist (Pt 50 - 74%/lift or lower) Assist level from bedside commode (at bedside): Moderate assist (Pt 50 - 74%/lift or lower)  Function - Chair/bed transfer Chair/bed transfer method: Lateral scoot Chair/bed transfer assist level: Touching or steadying assistance (Pt > 75%) Chair/bed transfer assistive device: Sliding board Mechanical lift: Clarise Cruz Chair/bed transfer details: Verbal cues for safe use of DME/AE, Manual facilitation for placement, Manual facilitation for weight shifting, Verbal cues for precautions/safety  Function - Locomotion: Wheelchair Will patient use wheelchair at discharge?: Yes Type: Manual Max wheelchair distance: 150 Assist Level: Supervision or verbal cues Wheel 50 feet with 2 turns activity did not occur: Safety/medical concerns Assist Level: Supervision or verbal cues Wheel 150 feet activity did not occur: Safety/medical concerns Assist Level: Supervision or verbal cues Turns around,maneuvers to table,bed, and toilet,negotiates 3% grade,maneuvers on rugs and over doorsills: No Function - Locomotion: Ambulation Ambulation activity did not occur: [name] Assistive device: Parallel bars Max distance: 16' Assist level: Touching or steadying assistance (Pt > 75%) Assist level: Touching or steadying assistance (Pt > 75%)  Function - Comprehension Comprehension:  Auditory Comprehension assist level: Follows basic conversation/direction with extra time/assistive device  Function - Expression Expression: Verbal Expression assist  level: Expresses complex 90% of the time/cues < 10% of the time  Function - Social Interaction Social Interaction assist level: Interacts appropriately 90% of the time - Needs monitoring or encouragement for participation or interaction.  Function - Problem Solving Problem solving assist level: Solves basic problems with no assist  Function - Memory Memory assist level: Recognizes or recalls 90% of the time/requires cueing < 10% of the time Patient normally able to recall (first 3 days only): Current season, Location of own room, Staff names and faces, That he or she is in a hospital  Medical Problem List and Plan: 1.  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  DVT Prophylaxis/Anticoagulation: SCDs.      Vascular study negative but unna boots limited study 3. Pain Management: Neurontin 400 mg 3 times daily 4. Mood: Lexapro 10 mg daily 5. Neuropsych: This patient is capable of making decisions on his own behalf. 6. Skin/Wound Care: Pressure relief measures.    Keflex started on 5/1 for surgical wound drainage, changed to Zosyn on 5/4 7. Fluids/Electrolytes/Nutrition: Strict I/Os. Heart healthy diet. Monitor voiding function.  8. OSA with acute hypercarbic respiratory failure: Has to use BIPAP or CPAP at nights 9. Acute on chronic diastolic CHF:  Improved with lasix which has been discontinued and changed to [facility] which has also been stopped.  Hydralazine dose adjusted. Monitor weights daily with strict I/Os.       Filed Weights   04/29/18 0527 04/30/18 0528 05/01/18 0605  Weight: 127.2 kg (280 lb 6.8 oz) 127.6 kg (281 lb 4.9 oz) 126.7 kg (279 lb 5.2 oz)    Stable on 5/4  Managed by cards, appreciate recs 10  Acute on chronic renal failure.   Hydralazine 50 mg 3 times daily   Hold ARB per Cards   Cr 1.80 on 5/4  Encourage appropriate fluid intake 11. Morbid obesity: BMI 40.46.  12. Hypoxic encephalopathy:   Resolved    limit sedating medications. Encourage IS. CPAP whenever asleep. 13.  Hypertension.  Lopressor 25 mg twice daily, increased to 50 BID on 5/3.    Labile, but improved on 5/4  Monitor with increased mobility 14.  BPH.  Flomax 0.4 mg daily, Toviaz 8 mg daily.   15.  Diabetes mellitus with peripheral neuropathy.  Hemoglobin A1c 7.7.  SSI.  Check blood sugars before meals and at bedtime   On amaryl at home, resumed. Titrate regimen as needed   Slightly labile on 5/4 16.  Fevers.  Resolved. Blood cultures no growth.  Urine culture negative. Completed course of Omnicef 17.  Hypothyroidism.  Synthroid 18.  Constipation.  Laxative assistance, increased Senokot to 2 tablets twice a day, continue MiraLAX 1 packet/day   Dulcolax prn 19. Hypokalemia:   Potassium 3.9 on 5/4   Continue supplementation   Cont to monitor 20. Acute blood loss anemia  Hemoglobin 10.0 on 5/3  Continue to monitor 21. Acute lower UTI  UA+, Ucx with greater than 100,000 Pseudomonas and greater than 40,000 Klebsiella, sensitivities reviewed  Keflex changed to cefepime , Changed to Zosyn on 5/3 due to multidrug resistances  LOS (Days) 15 A FACE TO FACE EVALUATION WAS PERFORMED  Ankit Lorie Phenix 05/01/2018, 12:56 PM

## 2018-05-01 NOTE — Progress Notes (Signed)
Physical Therapy Session Note  Patient Details  Name: Travis [name] MRN: [id number] Date of Birth: [birth year]  Today's Date: 05/01/2018 PT Individual Time: 1325-1355 PT Individual Time Calculation (min): 30 min   Short Term Goals: Week 3:  PT Short Term Goal 1 (Week 3): Pt will perform bed mobility with min assist  PT Short Term Goal 2 (Week 3): Pt will performed sit<>stand with min assist  PT Short Term Goal 3 (Week 3): Pt will bed<>WC transfer with min assist  PT Short Term Goal 4 (Week 3): Pt will ambulate 26f with min assist and LRAD without desat <88%   Skilled Therapeutic Interventions/Progress Updates:   Pt in w/c and agreeable to therapy, requesting to return to bed. Denies pain. Session focused on functional strengthening w/ mobility including transfers and sit<>stands. Performed lateral scoot transfer w/ min assist/set-up assist from w/c to bed. Emphasis on pushing up through both UEs and LEs. Performed sit<>stand x2 from elevated bed height to RW w/ max assist, facilitation at trunk to boost and verbal cues for technique. Emphasis on pushing up through LEs to boost into standing. Increased time required for both stands. Transferred to supine w/ max assist for LE management. Ended session in supine, call bell within reach and all needs met.   Therapy Documentation Precautions:  Precautions Precautions: Fall, Back, Cervical Restrictions Weight Bearing Restrictions: No  See Function Navigator for Current Functional Status.   Therapy/Group: Individual Therapy  Kemaria Dedic K Arnette 05/01/2018, 1:56 PM

## 2018-05-02 ENCOUNTER — Inpatient Hospital Stay (HOSPITAL_COMMUNITY): Payer: Medicare Other | Admitting: Occupational Therapy

## 2018-05-02 ENCOUNTER — Inpatient Hospital Stay (HOSPITAL_COMMUNITY): Payer: Medicare Other

## 2018-05-02 DIAGNOSIS — Z794 Long term (current) use of insulin: Secondary | ICD-10-CM

## 2018-05-02 DIAGNOSIS — E1165 Type 2 diabetes mellitus with hyperglycemia: Secondary | ICD-10-CM

## 2018-05-02 LAB — BASIC METABOLIC PANEL
Anion gap: 9 (ref 5–15)
BUN: 32 mg/dL — AB (ref 6–20)
CO2: 39 mmol/L — ABNORMAL HIGH (ref 22–32)
CREATININE: 1.84 mg/dL — AB (ref 0.61–1.24)
Calcium: 8.7 mg/dL — ABNORMAL LOW (ref 8.9–10.3)
Chloride: 96 mmol/L — ABNORMAL LOW (ref 101–111)
GFR, EST AFRICAN AMERICAN: 40 mL/min — AB (ref >60)
GFR, EST NON AFRICAN AMERICAN: 35 mL/min — AB (ref >60)
Glucose, Bld: 146 mg/dL — ABNORMAL HIGH (ref 65–99)
Potassium: 3.8 mmol/L (ref 3.5–5.1)
SODIUM: 144 mmol/L (ref 135–145)

## 2018-05-02 LAB — GLUCOSE, CAPILLARY
GLUCOSE-CAPILLARY: 151 mg/dL — AB (ref 65–99)
GLUCOSE-CAPILLARY: 137 mg/dL — AB (ref 65–99)
Glucose-Capillary: 223 mg/dL — ABNORMAL HIGH (ref 65–99)
Glucose-Capillary: 105 mg/dL — ABNORMAL HIGH (ref 65–99)

## 2018-05-02 NOTE — Progress Notes (Signed)
Occupational Therapy Session Note  Patient Details  Name: Travis [name] MRN: [id number] Date of Birth: [birth year]  Today's Date: 05/02/2018 OT Group Time: 1300-1340 OT Group Time Calculation (min): 40 min  Skilled Therapeutic Interventions/Progress Updates:    Pt participated in therapeutic dance group with focus on UE/LE strengthening and ROM, activity tolerance, and social participation. Pt was guided in dance exercises including forward and overhead claps, seated marches, ankle pumps, and leg lifts. Increased social demands with pt clapping both hands with neighbors and when holding hands and swaying to music for >3 minute windows. Pt smiling, interacting with group members, and requesting songs. 20 minutes missed due to toilet transfer/toileting with NT. Pt transported back to room with RT at end of session.   Therapy Documentation Precautions:  Precautions Precautions: Fall, Back, Cervical Restrictions Weight Bearing Restrictions: No General: General OT Amount of Missed Time: 20 Minutes Vital Signs: Therapy Vitals Temp: 97.7 F (36.5 C) Temp Source: Oral Pulse Rate: (!) 51 Resp: 18 BP: 138/68 Patient Position (if appropriate): Sitting Oxygen Therapy SpO2: 94 % O2 Device: Nasal Cannula ADL:      See Function Navigator for Current Functional Status.   Therapy/Group: Group Therapy  Zulema [place] A Chelbie Jarnagin 05/02/2018, 3:25 PM

## 2018-05-02 NOTE — Progress Notes (Signed)
Occupational Therapy Session Note  Patient Details  Name: Travis [name] MRN: [id number] Date of Birth: 11/26/1946  Today's Date: 05/02/2018 OT Individual Time: 0800-0900 OT Individual Time Calculation (min): 60 min    Short Term Goals: Week 2:  OT Short Term Goal 1 (Week 2): Pt will tolerate standing 5 mins in Stedy to complete 2 grooming tasks with increased endurance OT Short Term Goal 1 - Progress (Week 2): Partly met OT Short Term Goal 2 (Week 2): Pt will complete LB dressing with max assist with use of AE OT Short Term Goal 2 - Progress (Week 2): Progressing toward goal OT Short Term Goal 3 (Week 2): Pt will complete toilet transfer with mod assist with LRAD OT Short Term Goal 3 - Progress (Week 2): Met  Skilled Therapeutic Interventions/Progress Updates:    1;1. Pt completes supine>sitting EOB with supervision. Pt able to don brace today with set up and no VC. Pt sit to stand with MAX A from elevated bed for OT to advance pants past hips. Pt grooms at sink with set up while OT retrieves full OT tank. Pt propels w/c to dayroom with increased time and supervision, 3 rest breaks. Pt reporting increased fatigue this date but no pain. Pt declines standing as feel as though BLE weak, however request to work on kinetron. While seated in w/c pt complete 3x2 min on kinetron for reciprocal movment training and BLE endurance with 2 min rest breaks in between sets. Pt returned to room and remains in w/c with call light inreach and all needs met  Therapy Documentation Precautions:  Precautions Precautions: Fall, Back, Cervical Restrictions Weight Bearing Restrictions: No  See Function Navigator for Current Functional Status.   Therapy/Group: Individual Therapy   M  05/02/2018, 8:21 AM 

## 2018-05-02 NOTE — Progress Notes (Signed)
Patient has home BIPAP unit at bedside. Patient had already placed self on upon entering room and is resting comfortably.

## 2018-05-02 NOTE — Progress Notes (Signed)
Subjective/Complaints: Patient seen lying in bed this morning. He states he he did not sleep well overnight, but is unsure why.  ROS: denies CP, SOB, N/V/D.   Objective: Vital Signs: Blood pressure 138/68, pulse (!) 51, temperature 97.7 F (36.5 C), temperature source Oral, resp. rate 18, height _0  (1.854 m), weight 126.2 kg (278 lb 3.5 oz), SpO2 94 %. No results found. Results for orders placed or performed during the hospital encounter of 04/16/18 (from the past 72 hour(s))  Glucose, capillary     Status: Abnormal   Collection Time: 04/29/18  9:12 PM  Result Value Ref Range   Glucose-Capillary 168 (H) 65 - 99 mg/dL  Glucose, capillary     Status: Abnormal   Collection Time: 04/30/18  5:26 AM  Result Value Ref Range   Glucose-Capillary 128 (H) 65 - 99 mg/dL  Basic metabolic panel     Status: Abnormal   Collection Time: 04/30/18  6:31 AM  Result Value Ref Range   Sodium 141 135 - 145 mmol/L   Potassium 3.7 3.5 - 5.1 mmol/L   Chloride 96 (L) 101 - 111 mmol/L   CO2 37 (H) 22 - 32 mmol/L   Glucose, Bld 129 (H) 65 - 99 mg/dL   BUN 34 (H) 6 - 20 mg/dL   Creatinine, Ser 1.82 (H) 0.61 - 1.24 mg/dL   Calcium 8.6 (L) 8.9 - 10.3 mg/dL   GFR calc non Af Amer 35 (L) >60 mL/min   GFR calc Af Amer 41 (L) >60 mL/min    Comment: (NOTE) The eGFR has been calculated using the CKD EPI equation. This calculation has not been validated in all clinical situations. eGFR's persistently <60 mL/min signify possible Chronic Kidney Disease.    Anion gap 8 5 - 15    Comment: Performed at [facility] [address], [place], [place] [address]  CBC with Differential/Platelet     Status: Abnormal   Collection Time: 04/30/18  6:31 AM  Result Value Ref Range   WBC 5.0 4.0 - 10.5 K/uL   RBC 3.42 (L) 4.22 - 5.81 MIL/uL   Hemoglobin 10.0 (L) 13.0 - 17.0 g/dL   HCT 32.3 (L) 39.0 - 52.0 %   MCV 94.4 78.0 - 100.0 fL   MCH 29.2 26.0 - 34.0 pg   MCHC 31.0 30.0 - 36.0 g/dL   RDW 16.6 (H) 11.5 -  15.5 %   Platelets 222 150 - 400 K/uL   Neutrophils Relative % 70 %   Neutro Abs 3.5 1.7 - 7.7 K/uL   Lymphocytes Relative 20 %   Lymphs Abs 1.0 0.7 - 4.0 K/uL   Monocytes Relative 7 %   Monocytes Absolute 0.3 0.1 - 1.0 K/uL   Eosinophils Relative 3 %   Eosinophils Absolute 0.1 0.0 - 0.7 K/uL   Basophils Relative 0 %   Basophils Absolute 0.0 0.0 - 0.1 K/uL    Comment: Performed at [facility] [address] [place], [place] [address]  Glucose, capillary     Status: Abnormal   Collection Time: 04/30/18 12:02 PM  Result Value Ref Range   Glucose-Capillary 122 (H) 65 - 99 mg/dL  Glucose, capillary     Status: Abnormal   Collection Time: 04/30/18  4:32 PM  Result Value Ref Range   Glucose-Capillary 151 (H) 65 - 99 mg/dL  Glucose, capillary     Status: Abnormal   Collection Time: 04/30/18  8:57 PM  Result Value Ref Range  Glucose-Capillary 175 (H) 65 - 99 mg/dL  Basic metabolic panel     Status: Abnormal   Collection Time: 05/01/18  5:37 AM  Result Value Ref Range   Sodium 143 135 - 145 mmol/L   Potassium 3.9 3.5 - 5.1 mmol/L   Chloride 95 (L) 101 - 111 mmol/L   CO2 37 (H) 22 - 32 mmol/L   Glucose, Bld 111 (H) 65 - 99 mg/dL   BUN 32 (H) 6 - 20 mg/dL   Creatinine, Ser 1.80 (H) 0.61 - 1.24 mg/dL   Calcium 8.9 8.9 - 10.3 mg/dL   GFR calc non Af Amer 36 (L) >60 mL/min   GFR calc Af Amer 41 (L) >60 mL/min    Comment: (NOTE) The eGFR has been calculated using the CKD EPI equation. This calculation has not been validated in all clinical situations. eGFR's persistently <60 mL/min signify possible Chronic Kidney Disease.    Anion gap 11 5 - 15    Comment: Performed at [facility] [address], [place], [place] [address]  Glucose, capillary     Status: Abnormal   Collection Time: 05/01/18  6:03 AM  Result Value Ref Range   Glucose-Capillary 111 (H) 65 - 99 mg/dL  Glucose, capillary     Status: Abnormal   Collection Time: 05/01/18 11:59 AM  Result Value Ref  Range   Glucose-Capillary 174 (H) 65 - 99 mg/dL  Glucose, capillary     Status: Abnormal   Collection Time: 05/01/18  4:39 PM  Result Value Ref Range   Glucose-Capillary 128 (H) 65 - 99 mg/dL  Glucose, capillary     Status: Abnormal   Collection Time: 05/01/18  9:03 PM  Result Value Ref Range   Glucose-Capillary 133 (H) 65 - 99 mg/dL   Comment 1 Notify RN   Glucose, capillary     Status: Abnormal   Collection Time: 05/02/18  6:48 AM  Result Value Ref Range   Glucose-Capillary 151 (H) 65 - 99 mg/dL  Basic metabolic panel     Status: Abnormal   Collection Time: 05/02/18  6:56 AM  Result Value Ref Range   Sodium 144 135 - 145 mmol/L   Potassium 3.8 3.5 - 5.1 mmol/L   Chloride 96 (L) 101 - 111 mmol/L   CO2 39 (H) 22 - 32 mmol/L   Glucose, Bld 146 (H) 65 - 99 mg/dL   BUN 32 (H) 6 - 20 mg/dL   Creatinine, Ser 1.84 (H) 0.61 - 1.24 mg/dL   Calcium 8.7 (L) 8.9 - 10.3 mg/dL   GFR calc non Af Amer 35 (L) >60 mL/min   GFR calc Af Amer 40 (L) >60 mL/min    Comment: (NOTE) The eGFR has been calculated using the CKD EPI equation. This calculation has not been validated in all clinical situations. eGFR's persistently <60 mL/min signify possible Chronic Kidney Disease.    Anion gap 9 5 - 15    Comment: Performed at [facility] [address] [place], [place] [address]  Glucose, capillary     Status: Abnormal   Collection Time: 05/02/18 11:51 AM  Result Value Ref Range   Glucose-Capillary 137 (H) 65 - 99 mg/dL  Glucose, capillary     Status: Abnormal   Collection Time: 05/02/18  4:41 PM  Result Value Ref Range   Glucose-Capillary 223 (H) 65 - 99 mg/dL    Constitutional: No distress . Vital signs reviewed. HENT: Normocephalic.  Atraumatic. Eyes: EOMI. No discharge. Cardiovascular:  RRR. No JVD. Respiratory: CTA Bilaterally. Normal effort. GI: BS +. Non-distended. Musc/Skel:  LE edema. No tenderness. Neuro: Alert and Oriented  Motor: 5/5 BUE proximal to distal BLE 3-/5 HF,  4-/5 distally (stable) Skin: Unna boots to bilateral lower extremities Surgical incision with dressing C/D/I  Assessment/Plan: 1. 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 Physiatrist is providing close team supervision and 24 hour management of active medical problems listed below. Physiatrist and rehab team continue to assess barriers to discharge/monitor patient progress toward functional and medical goals. FIM: Function - Bathing Position: Sitting EOB Body parts bathed by patient: Right arm, Left arm, Chest, Abdomen, Front perineal area, Right upper leg, Left upper leg Body parts bathed by helper: Buttocks, Right lower leg, Left lower leg, Back Bathing not applicable: Left lower leg, Right lower leg(Unna boots) Assist Level: (Mod assist)  Function- Upper Body Dressing/Undressing What is the patient wearing?: Pull over shirt/dress Pull over shirt/dress - Perfomed by patient: Thread/unthread right sleeve, Thread/unthread left sleeve, Put head through opening, Pull shirt over trunk Assist Level: Set up Function - Lower Body Dressing/Undressing What is the patient wearing?: Pants, Non-skid slipper socks Position: Sitting EOB Pants- Performed by helper: Thread/unthread right pants leg, Thread/unthread left pants leg, Pull pants up/down Non-skid slipper socks- Performed by helper: Don/doff right sock, Don/doff left sock TED Hose - Performed by helper: Don/doff right TED hose, Don/doff left TED hose Assist for footwear: Dependant Assist for lower body dressing: Touching or steadying assistance (Pt > 75%)  Function - Toileting Toileting activity did not occur: No continent bowel/bladder event Toileting steps completed by patient: Adjust clothing prior to toileting, Performs perineal hygiene, Adjust clothing after toileting Toileting steps completed by helper: Adjust clothing prior to  toileting, Performs perineal hygiene, Adjust clothing after toileting Toileting Assistive Devices: Grab bar or rail, Toilet aid Assist level: Touching or steadying assistance (Pt.75%)  Function - Air cabin crew transfer activity did not occur: Safety/medical concerns Toilet transfer assistive device: Bedside commode, Mechanical lift Mechanical lift: Stedy Assist level to toilet: Maximal assist (Pt 25 - 49%/lift and lower) Assist level from toilet: Maximal assist (Pt 25 - 49%/lift and lower) Assist level to bedside commode (at bedside): Moderate assist (Pt 50 - 74%/lift or lower) Assist level from bedside commode (at bedside): Moderate assist (Pt 50 - 74%/lift or lower)  Function - Chair/bed transfer Chair/bed transfer method: Lateral scoot Chair/bed transfer assist level: Touching or steadying assistance (Pt > 75%) Chair/bed transfer assistive device: Armrests Mechanical lift: Clarise Cruz Chair/bed transfer details: Verbal cues for safe use of DME/AE, Manual facilitation for placement, Manual facilitation for weight shifting, Verbal cues for precautions/safety  Function - Locomotion: Wheelchair Will patient use wheelchair at discharge?: Yes Type: Manual Max wheelchair distance: 150 Assist Level: Supervision or verbal cues Wheel 50 feet with 2 turns activity did not occur: Safety/medical concerns Assist Level: Supervision or verbal cues Wheel 150 feet activity did not occur: Safety/medical concerns Assist Level: Supervision or verbal cues Turns around,maneuvers to table,bed, and toilet,negotiates 3% grade,maneuvers on rugs and over doorsills: No Function - Locomotion: Ambulation Ambulation activity did not occur: [name] Assistive device: Parallel bars Max distance: 16' Assist level: Touching or steadying assistance (Pt > 75%) Assist level: Touching or steadying assistance (Pt > 75%)  Function - Comprehension Comprehension: Auditory Comprehension assist level: Follows basic  conversation/direction with extra time/assistive device  Function - Expression Expression: Verbal Expression assist level: Expresses complex 90% of  the time/cues < 10% of the time  Function - Social Interaction Social Interaction assist level: Interacts appropriately 90% of the time - Needs monitoring or encouragement for participation or interaction.  Function - Problem Solving Problem solving assist level: Solves basic problems with no assist  Function - Memory Memory assist level: Recognizes or recalls 90% of the time/requires cueing < 10% of the time Patient normally able to recall (first 3 days only): Current season, Location of own room, Staff names and faces, That he or she is in a hospital  Medical Problem List and Plan: 1.  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  DVT Prophylaxis/Anticoagulation: SCDs.      Vascular study negative but unna boots limited study 3. Pain Management: Neurontin 400 mg 3 times daily 4. Mood: Lexapro 10 mg daily 5. Neuropsych: This patient is capable of making decisions on his own behalf. 6. Skin/Wound Care: Pressure relief measures.    Keflex started on 5/1 for surgical wound drainage, changed to Zosyn on 5/4 7. Fluids/Electrolytes/Nutrition: Strict I/Os. Heart healthy diet. Monitor voiding function.  8. OSA with acute hypercarbic respiratory failure: Has to use BIPAP or CPAP at nights 9. Acute on chronic diastolic CHF:  Improved with lasix which has been discontinued and changed to [facility] which has also been stopped.  Hydralazine dose adjusted. Monitor weights daily with strict I/Os.       Filed Weights   04/30/18 0528 05/01/18 0605 05/02/18 0356  Weight: 127.6 kg (281 lb 4.9 oz) 126.7 kg (279 lb 5.2 oz) 126.2 kg (278 lb 3.5 oz)    Stable on 5/5  Managed by cards, appreciate recs 10 Acute on chronic renal failure.   Hydralazine 50 mg 3  times daily   Hold ARB per Cards   Cr 1.84 on 5/5  Encourage appropriate fluid intake 11. Morbid obesity: BMI 40.46.  12. Hypoxic encephalopathy:   Resolved    limit sedating medications. Encourage IS. CPAP whenever asleep. 13.  Hypertension.  Lopressor 25 mg twice daily, increased to 50 BID on 5/3.    Labile, but relatively controlled on 5/5  Monitor with increased mobility 14.  BPH.  Flomax 0.4 mg daily, Toviaz 8 mg daily.   15.  Diabetes mellitus with peripheral neuropathy.  Hemoglobin A1c 7.7.  SSI.  Check blood sugars before meals and at bedtime   On amaryl at home, resumed. Titrate regimen as needed   Slightly labile on 5/5 16.  Fevers.  Resolved. Blood cultures no growth.  Urine culture negative. Completed course of Omnicef 17.  Hypothyroidism.  Synthroid 18.  Constipation.  Laxative assistance, increased Senokot to 2 tablets twice a day, continue MiraLAX 1 packet/day   Dulcolax prn 19. Hypokalemia:   Potassium 3.8 on 5/5   Continue supplementation   Cont to monitor 20. Acute blood loss anemia  Hemoglobin 10.0 on 5/3  Continue to monitor 21. Acute lower UTI  UA+, Ucx with greater than 100,000 Pseudomonas and greater than 40,000 Klebsiella, sensitivities reviewed  Keflex changed to cefepime , Changed to Zosyn on 5/3 due to multidrug resistances  LOS (Days) 16 A FACE TO FACE EVALUATION WAS PERFORMED  Travis [name] Lorie Phenix 05/02/2018, 5:30 PM

## 2018-05-03 ENCOUNTER — Inpatient Hospital Stay (HOSPITAL_COMMUNITY): Payer: Medicare Other

## 2018-05-03 ENCOUNTER — Inpatient Hospital Stay (HOSPITAL_COMMUNITY): Payer: Medicare Other | Admitting: Physical Therapy

## 2018-05-03 LAB — GLUCOSE, CAPILLARY
GLUCOSE-CAPILLARY: 177 mg/dL — AB (ref 65–99)
GLUCOSE-CAPILLARY: 119 mg/dL — AB (ref 65–99)
GLUCOSE-CAPILLARY: 184 mg/dL — AB (ref 65–99)
Glucose-Capillary: 122 mg/dL — ABNORMAL HIGH (ref 65–99)

## 2018-05-03 LAB — BASIC METABOLIC PANEL
ANION GAP: 9 (ref 5–15)
BUN: 32 mg/dL — ABNORMAL HIGH (ref 6–20)
CHLORIDE: 98 mmol/L — AB (ref 101–111)
CO2: 39 mmol/L — AB (ref 22–32)
Calcium: 8.7 mg/dL — ABNORMAL LOW (ref 8.9–10.3)
Creatinine, Ser: 1.86 mg/dL — ABNORMAL HIGH (ref 0.61–1.24)
GFR calc non Af Amer: 34 mL/min — ABNORMAL LOW (ref >60)
GFR, EST AFRICAN AMERICAN: 40 mL/min — AB (ref >60)
Glucose, Bld: 111 mg/dL — ABNORMAL HIGH (ref 65–99)
Potassium: 3.8 mmol/L (ref 3.5–5.1)
Sodium: 146 mmol/L — ABNORMAL HIGH (ref 135–145)

## 2018-05-03 NOTE — Progress Notes (Signed)
Nurse entered patients room and patient requested that nurse check O2 Sats while he was on home BiPap machine. O2 sats reading 70%. Patient was taken off of home BiPap machine and placed on nasal cannula. O2 sats @ 94% presently. Respiratory aware. Will continue to monitor.

## 2018-05-03 NOTE — Progress Notes (Signed)
Occupational Therapy Session Note  Patient Details  Name: Travis [name] MRN: [id number] Date of Birth: 08/07/1946  Today's Date: 05/03/2018 OT Individual Time: 1300-1357 OT Individual Time Calculation (min): 57 min    Short Term Goals: Week 3:  OT Short Term Goal 1 (Week 3): Pt will complete bathing with mod assist with use of AE  OT Short Term Goal 2 (Week 3): Pt will complete LB dressing with max assist with use of AE OT Short Term Goal 3 (Week 3): Pt will complete 1/3 toileting steps to decrease burden of care OT Short Term Goal 4 (Week 3): Pt will complete 1 grooming task in standing to increase standing tolerance  Skilled Therapeutic Interventions/Progress Updates:    Pt in w/c agreeable to therapy with no c/o pain. Pt requesting to go outside for fresh air. Pt completed 250ft of w/c mobility, requiring 4 rest breaks throughout. Vc required for pacing d/t pt pushing through SOB. Edu and demo provided re navigating around community obstacles at w/c level, including elevators and doorways. Pt engaged in therapeutic talk outdoors, with OT providing emotional support re condition and d/c planning. Pt returned to 4th floor, performing 250ft of w/c mobility, with 4 rest breaks. Pt completed sit to stand transfer from w/c level with max A. Pt able to maintain static standing balance for ~1 min with CGA before needing to sit. Pt left in room with all needs met.   Therapy Documentation Precautions:  Precautions Precautions: Fall, Back, Cervical Restrictions Weight Bearing Restrictions: No   Vital Signs: Therapy Vitals Temp: 97.7 F (36.5 C) Temp Source: Oral Pulse Rate: (!) 53 BP: (!) 133/56 Patient Position (if appropriate): Sitting Oxygen Therapy SpO2: 96 % O2 Device: Nasal Cannula Pain: Pain Assessment Pain Scale: 0-10 Pain Score: 0-No pain  See Function Navigator for Current Functional Status.   Therapy/Group: Individual Therapy   H Sherrard 05/03/2018, 3:04 PM 

## 2018-05-03 NOTE — Progress Notes (Signed)
Pharmacy Antibiotic Note  Travis [name] is a 72 y.o. male admitted s/p recent lumbar surgery on 4/11 developed respiratory failure transferred to CIR on 04/16/2018. Pt with surgical wound drainage and UTI started on keflex on 5/1 which was changed to cefepime last night. Urine culture is positive for PAS and klebsiella ornithinolytica. Both are sensitive to zosyn. Pharmacy is consulted to dose zosyn.  Scr continuing to trend up to 1.86 today, est. ncrcl ~62ml/min  Plan: Zosyn 3.375g IV Q 8 hrs (4hr infusion) plan through 5/10 Monitor clinical progress, cultures/sensitivities, renal function, abx plan   Height: 6\' 1"  (185.4 cm) Weight: 278 lb (126.1 kg) IBW/kg (Calculated) : 79.9  Temp (24hrs), Avg:97.8 F (36.6 C), Min:97.7 F (36.5 C), Max:98 F (36.7 C)  Recent Labs  Lab 04/29/18 0647 04/30/18 0631 05/01/18 0537 05/02/18 0656 05/03/18 0522  WBC  --  5.0  --   --   --   CREATININE 1.77* 1.82* 1.80* 1.84* 1.86*    Estimated Creatinine Clearance: 49.2 mL/min (A) (by C-G formula based on SCr of 1.86 mg/dL (H)).    Allergies  Allergen Reactions  . Other Other (See Comments)    ANESTHESIA UNSPECIFIED  -- makes sick, must have antinausea medication in advance.  . Levofloxacin In D5w Itching    Possible infusion reaction 03/29/17. Tolerating oral Levaquin 03/30/17    Antimicrobials this admission: 5/1 Keflex >> 5/2 5/2 Cefepime >> 5/3 5/3 Zosyn >> (5/10)  4/14 Vanc>>4/15 4/14 Zosyn >>4/16 4/16 cefdinir >>4/17  Microbiology results: 4/29 Urine - 100K colonies PSA (S-cefepime, fortaz, imi, zosyn)                      40K colonies klebsiella ornithinolytica (S- cipro, imi, nitro, zosyn)  4/14 blood x 2 >> neg 4/14 urine neg    Thank you for allowing [place] to participate in this patients care.  Jens Som, PharmD Clinical phone for 05/03/2018 from 7a-3:30p: [phone] If after 3:30p, please call main pharmacy at: [phone] 05/03/2018 2:36 PM

## 2018-05-03 NOTE — Progress Notes (Signed)
Physical Therapy Session Note  Patient Details  Name: Travis [name] MRN: [id number] Date of Birth: 03/19/1946  Today's Date: 05/03/2018 PT Individual Time: 0800-0915 and 1130-1200 PT Individual Time Calculation (min): 75 min and 30 min  Short Term Goals: Week 3:  PT Short Term Goal 1 (Week 3): Pt will perform bed mobility with min assist  PT Short Term Goal 2 (Week 3): Pt will performed sit<>stand with min assist  PT Short Term Goal 3 (Week 3): Pt will bed<>WC transfer with min assist  PT Short Term Goal 4 (Week 3): Pt will ambulate 25ft with min assist and LRAD without desat <88%   Skilled Therapeutic Interventions/Progress Updates: Tx1: Pt presented in bed agreeable to therapy. Performed supine to sit at EOB with use of features and increased time. Cues to avoid bending per back precautions. PTA donned pants with total assist for threading. Performed sit to stand from elevated mat with modA and pt was able to pull up pants from knee height with single UE support on RW. Performed stand pivot transfer to w/c and propelled to sink to brush hair. SpO2 after transfer 84% on 3L supplemental O2. Pt then indicating urgency for BM. Performed sit stand from w/c modA and increased time and assist to increase anterior wt shift with minA stand pivot to BSC (+BM). Pt required same assist for transfer back to w/c. Propelled to rehab gym requiring increased time for recovery due to fatigue and desat to 84%. Performed w/c to mat stand pivot transfer and sit to/from stand x3 from elevated mat with min to modA due to fatigue. Pt noted to require increased cues for BLE placement to facilitate transfer and decrease posterior lean upon standing. Pt performed ambulatory transfer to w/c and propelled back to room supervision level. Pt remained in w/c at end of session with call bell within reach and needs met.   Tx2: Pt presented in w/c agreeable to therapy. Session focused on LE strengthening via therex and gait. Pt  propelled to rehab gym supervision with SpO2 88% once arrived in gym. Pt performed standing therex in parallel bars requiring modA to pull self up to standing in bars. Performed standing [id number] with cues to decrease compensatory trunk flexion/ext, hip abd, standing march, heel rises, HS pulls 2 x 5 each with x 1 seated rest due to fatigue however pt able to maintain SpO2 >88% throughout activity. Performed gait training with sit to/stand from w/c requiring modA and ambulating x 15 ft min guard with RW with SpO2 90% after gait. Pt transported back to room for time management and remained in w/c at end of session with needs met.     Therapy Documentation Precautions:  Precautions Precautions: Fall, Back, Cervical Restrictions Weight Bearing Restrictions: No  See Function Navigator for Current Functional Status.   Therapy/Group: Individual Therapy      , PTA  05/03/2018, 12:19 PM  

## 2018-05-03 NOTE — Progress Notes (Addendum)
Subjective/Complaints: Pt seen lying in bed this AM.  He slept well overnight.  He would like to know if he can go home by this Friday.  ROS: Denies CP, SOB, N/V/D.   Objective: Vital Signs: Blood pressure 140/64, pulse (!) 56, temperature 98 F (36.7 C), temperature source Oral, resp. rate 20, height _0  (1.854 m), weight 126.1 kg (278 lb), SpO2 94 %. No results found. Results for orders placed or performed during the hospital encounter of 04/16/18 (from the past 72 hour(s))  Glucose, capillary     Status: Abnormal   Collection Time: 04/30/18 12:02 PM  Result Value Ref Range   Glucose-Capillary 122 (H) 65 - 99 mg/dL  Glucose, capillary     Status: Abnormal   Collection Time: 04/30/18  4:32 PM  Result Value Ref Range   Glucose-Capillary 151 (H) 65 - 99 mg/dL  Glucose, capillary     Status: Abnormal   Collection Time: 04/30/18  8:57 PM  Result Value Ref Range   Glucose-Capillary 175 (H) 65 - 99 mg/dL  Basic metabolic panel     Status: Abnormal   Collection Time: 05/01/18  5:37 AM  Result Value Ref Range   Sodium 143 135 - 145 mmol/L   Potassium 3.9 3.5 - 5.1 mmol/L   Chloride 95 (L) 101 - 111 mmol/L   CO2 37 (H) 22 - 32 mmol/L   Glucose, Bld 111 (H) 65 - 99 mg/dL   BUN 32 (H) 6 - 20 mg/dL   Creatinine, Ser 1.80 (H) 0.61 - 1.24 mg/dL   Calcium 8.9 8.9 - 10.3 mg/dL   GFR calc non Af Amer 36 (L) >60 mL/min   GFR calc Af Amer 41 (L) >60 mL/min    Comment: (NOTE) The eGFR has been calculated using the CKD EPI equation. This calculation has not been validated in all clinical situations. eGFR's persistently <60 mL/min signify possible Chronic Kidney Disease.    Anion gap 11 5 - 15    Comment: Performed at [facility] [address], [place], [place] [address]  Glucose, capillary     Status: Abnormal   Collection Time: 05/01/18  6:03 AM  Result Value Ref Range   Glucose-Capillary 111 (H) 65 - 99 mg/dL  Glucose, capillary     Status: Abnormal   Collection Time:  05/01/18 11:59 AM  Result Value Ref Range   Glucose-Capillary 174 (H) 65 - 99 mg/dL  Glucose, capillary     Status: Abnormal   Collection Time: 05/01/18  4:39 PM  Result Value Ref Range   Glucose-Capillary 128 (H) 65 - 99 mg/dL  Glucose, capillary     Status: Abnormal   Collection Time: 05/01/18  9:03 PM  Result Value Ref Range   Glucose-Capillary 133 (H) 65 - 99 mg/dL   Comment 1 Notify RN   Glucose, capillary     Status: Abnormal   Collection Time: 05/02/18  6:48 AM  Result Value Ref Range   Glucose-Capillary 151 (H) 65 - 99 mg/dL  Basic metabolic panel     Status: Abnormal   Collection Time: 05/02/18  6:56 AM  Result Value Ref Range   Sodium 144 135 - 145 mmol/L   Potassium 3.8 3.5 - 5.1 mmol/L   Chloride 96 (L) 101 - 111 mmol/L   CO2 39 (H) 22 - 32 mmol/L   Glucose, Bld 146 (H) 65 - 99 mg/dL   BUN 32 (H) 6 - 20 mg/dL   Creatinine, Ser 1.84 (H) 0.61 -  1.24 mg/dL   Calcium 8.7 (L) 8.9 - 10.3 mg/dL   GFR calc non Af Amer 35 (L) >60 mL/min   GFR calc Af Amer 40 (L) >60 mL/min    Comment: (NOTE) The eGFR has been calculated using the CKD EPI equation. This calculation has not been validated in all clinical situations. eGFR's persistently <60 mL/min signify possible Chronic Kidney Disease.    Anion gap 9 5 - 15    Comment: Performed at [facility] [address], [place], [place] [address]  Glucose, capillary     Status: Abnormal   Collection Time: 05/02/18 11:51 AM  Result Value Ref Range   Glucose-Capillary 137 (H) 65 - 99 mg/dL  Glucose, capillary     Status: Abnormal   Collection Time: 05/02/18  4:41 PM  Result Value Ref Range   Glucose-Capillary 223 (H) 65 - 99 mg/dL  Glucose, capillary     Status: Abnormal   Collection Time: 05/02/18  8:55 PM  Result Value Ref Range   Glucose-Capillary 105 (H) 65 - 99 mg/dL   Comment 1 Notify RN   Basic metabolic panel     Status: Abnormal   Collection Time: 05/03/18  5:22 AM  Result Value Ref Range   Sodium 146 (H)  135 - 145 mmol/L   Potassium 3.8 3.5 - 5.1 mmol/L   Chloride 98 (L) 101 - 111 mmol/L   CO2 39 (H) 22 - 32 mmol/L   Glucose, Bld 111 (H) 65 - 99 mg/dL   BUN 32 (H) 6 - 20 mg/dL   Creatinine, Ser 1.86 (H) 0.61 - 1.24 mg/dL   Calcium 8.7 (L) 8.9 - 10.3 mg/dL   GFR calc non Af Amer 34 (L) >60 mL/min   GFR calc Af Amer 40 (L) >60 mL/min    Comment: (NOTE) The eGFR has been calculated using the CKD EPI equation. This calculation has not been validated in all clinical situations. eGFR's persistently <60 mL/min signify possible Chronic Kidney Disease.    Anion gap 9 5 - 15    Comment: Performed at [facility] [address], [place],  [address]  Glucose, capillary     Status: Abnormal   Collection Time: 05/03/18  6:24 AM  Result Value Ref Range   Glucose-Capillary 122 (H) 65 - 99 mg/dL    Constitutional: No distress . Vital signs reviewed. HENT: Normocephalic.  Atraumatic. Eyes: EOMI. No discharge. Cardiovascular: RRR. No JVD. Respiratory: CTA Bilaterally. Normal effort. GI: BS +. Non-distended. Musc/Skel:  LE edema. No tenderness. Neuro: Alert and Oriented  Motor: 5/5 BUE proximal to distal BLE 3-/5 HF, 4-/5 distally (unchanged) Skin: Unna boots to bilateral lower extremities Surgical incision with dressing C/D/I  Assessment/Plan: 1. 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 Physiatrist is providing close team supervision and 24 hour management of active medical problems listed below. Physiatrist and rehab team continue to assess barriers to discharge/monitor patient progress toward functional and medical goals. FIM: Function - Bathing Position: Sitting EOB Body parts bathed by patient: Right arm, Left arm, Chest, Abdomen, Front perineal area, Right upper leg, Left upper leg Body parts bathed by helper: Buttocks, Right lower leg, Left lower leg, Back Bathing  not applicable: Left lower leg, Right lower leg(Unna boots) Assist Level: (Mod assist)  Function- Upper Body Dressing/Undressing What is the patient wearing?: Pull over shirt/dress Pull over shirt/dress - Perfomed by patient: Thread/unthread right sleeve,  Thread/unthread left sleeve, Put head through opening, Pull shirt over trunk Assist Level: Set up Function - Lower Body Dressing/Undressing What is the patient wearing?: Pants, Non-skid slipper socks Position: Sitting EOB Pants- Performed by helper: Thread/unthread right pants leg, Thread/unthread left pants leg, Pull pants up/down Non-skid slipper socks- Performed by helper: Don/doff right sock, Don/doff left sock TED Hose - Performed by helper: Don/doff right TED hose, Don/doff left TED hose Assist for footwear: Dependant Assist for lower body dressing: Touching or steadying assistance (Pt > 75%)  Function - Toileting Toileting activity did not occur: No continent bowel/bladder event Toileting steps completed by patient: Adjust clothing prior to toileting, Performs perineal hygiene, Adjust clothing after toileting Toileting steps completed by helper: Adjust clothing prior to toileting, Performs perineal hygiene, Adjust clothing after toileting Toileting Assistive Devices: Grab bar or rail, Toilet aid Assist level: Touching or steadying assistance (Pt.75%)  Function - Air cabin crew transfer activity did not occur: Safety/medical concerns Toilet transfer assistive device: Bedside commode, Mechanical lift Mechanical lift: Stedy Assist level to toilet: Maximal assist (Pt 25 - 49%/lift and lower) Assist level from toilet: Maximal assist (Pt 25 - 49%/lift and lower) Assist level to bedside commode (at bedside): Moderate assist (Pt 50 - 74%/lift or lower) Assist level from bedside commode (at bedside): Moderate assist (Pt 50 - 74%/lift or lower)  Function - Chair/bed transfer Chair/bed transfer method: Lateral scoot Chair/bed  transfer assist level: Touching or steadying assistance (Pt > 75%) Chair/bed transfer assistive device: Armrests Mechanical lift: Clarise Cruz Chair/bed transfer details: Verbal cues for safe use of DME/AE, Manual facilitation for placement, Manual facilitation for weight shifting, Verbal cues for precautions/safety  Function - Locomotion: Wheelchair Will patient use wheelchair at discharge?: Yes Type: Manual Max wheelchair distance: 150 Assist Level: Supervision or verbal cues Wheel 50 feet with 2 turns activity did not occur: Safety/medical concerns Assist Level: Supervision or verbal cues Wheel 150 feet activity did not occur: Safety/medical concerns Assist Level: Supervision or verbal cues Turns around,maneuvers to table,bed, and toilet,negotiates 3% grade,maneuvers on rugs and over doorsills: No Function - Locomotion: Ambulation Ambulation activity did not occur: [name] Assistive device: Parallel bars Max distance: 16' Assist level: Touching or steadying assistance (Pt > 75%) Assist level: Touching or steadying assistance (Pt > 75%)  Function - Comprehension Comprehension: Auditory Comprehension assist level: Follows basic conversation/direction with extra time/assistive device  Function - Expression Expression: Verbal Expression assist level: Expresses complex 90% of the time/cues < 10% of the time  Function - Social Interaction Social Interaction assist level: Interacts appropriately 90% of the time - Needs monitoring or encouragement for participation or interaction.  Function - Problem Solving Problem solving assist level: Solves basic problems with no assist  Function - Memory Memory assist level: Recognizes or recalls 90% of the time/requires cueing < 10% of the time Patient normally able to recall (first 3 days only): Current season, Location of own room, Staff names and faces, That he or she is in a hospital  Medical Problem List and Plan: 1.  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  DVT Prophylaxis/Anticoagulation: SCDs.      Vascular study negative but unna boots limited study 3. Pain Management: Neurontin 400 mg 3 times daily 4. Mood: Lexapro 10 mg daily 5. Neuropsych: This patient is capable of making decisions on his own behalf. 6. Skin/Wound Care: Pressure  relief measures.    Keflex started on 5/1 for surgical wound drainage, changed to Zosyn on 5/4-5/10 7. Fluids/Electrolytes/Nutrition: Strict I/Os. Heart healthy diet. Monitor voiding function.  8. OSA with acute hypercarbic respiratory failure: Has to use BIPAP or CPAP at nights 9. Acute on chronic diastolic CHF:  Improved with lasix which has been discontinued and changed to [facility] which has also been stopped.  Hydralazine dose adjusted. Monitor weights daily with strict I/Os.       Filed Weights   05/01/18 0605 05/02/18 0356 05/03/18 0500  Weight: 126.7 kg (279 lb 5.2 oz) 126.2 kg (278 lb 3.5 oz) 126.1 kg (278 lb)    Stable on 5/6  Managed by cards, appreciate recs 10 Acute on chronic renal failure.   Hydralazine 50 mg 3 times daily   Hold ARB per Cards   Cr 1.86 on 5/6  Encourage appropriate fluid intake 11. Morbid obesity: BMI 40.46.  12. Hypoxic encephalopathy:   Resolved    limit sedating medications. Encourage IS. CPAP whenever asleep. 13.  Hypertension.  Lopressor 25 mg twice daily, increased to 50 BID on 5/3.    Slightly elevated on 5/6  Monitor with increased mobility 14.  BPH.  Flomax 0.4 mg daily, Toviaz 8 mg daily.   15.  Diabetes mellitus with peripheral neuropathy.  Hemoglobin A1c 7.7.  SSI.  Check blood sugars before meals and at bedtime   On amaryl at home, resumed. Titrate regimen as needed   Slightly labile on 5/6 16.  Fevers.  Resolved. Blood cultures no growth.  Urine culture negative. Completed  course of Omnicef 17.  Hypothyroidism.  Synthroid 18.  Constipation.  Laxative assistance, increased Senokot to 2 tablets twice a day, continue MiraLAX 1 packet/day   Dulcolax prn 19. Hypokalemia:   Potassium 3.8 on 5/6   Continue supplementation   Cont to monitor 20. Acute blood loss anemia  Hemoglobin 10.0 on 5/3  Continue to monitor 21. Acute lower UTI  UA+, Ucx with greater than 100,000 Pseudomonas and greater than 40,000 Klebsiella, sensitivities reviewed  Keflex changed to cefepime , Changed to Zosyn on 5/3 due to multidrug resistances  LOS (Days) 17 A FACE TO FACE EVALUATION WAS PERFORMED  Travis [name] Lorie Phenix 05/03/2018, 8:01 AM

## 2018-05-03 NOTE — Progress Notes (Signed)
Progress Note  Patient Name: Travis OROURKE Date of Encounter: 05/03/2018  Primary Cardiologist:  Onia Shiflett   Subjective   72 year old gentleman with a history of chronic diastolic congestive heart failure, obstructive sleep apnea, hypertension, diabetes, and mild pulmonary hypertension.  He was admitted for back surgery on April 11.  He developed acute on chronic diastolic congestive heart failure and developed respiratory distress following his surgery.  We have followed him for management of his congestive heart failure.  He is in the inpatient rehab program at this time. Creatinine has increased slightly.  Inpatient Medications    Scheduled Meds: . escitalopram  10 mg Oral Daily  . fesoterodine  8 mg Oral Daily  . gabapentin  400 mg Oral TID  . glimepiride  1 mg Oral Q breakfast  . hydrALAZINE  50 mg Oral Q8H  . hydrocerin   Topical Weekly  . insulin aspart  0-15 Units Subcutaneous TID WC  . levothyroxine  175 mcg Oral QAC breakfast  . metoprolol tartrate  50 mg Oral BID  . polyethylene glycol  17 g Oral Daily  . potassium chloride  20 mEq Oral BID  . pramipexole  0.5 mg Oral BID  . senna  2 tablet Oral BID  . tamsulosin  0.4 mg Oral Daily  . torsemide  80 mg Oral BID   Continuous Infusions: . piperacillin-tazobactam (ZOSYN)  IV Stopped (05/03/18 0920)   PRN Meds: acetaminophen **OR** acetaminophen, bisacodyl, menthol-cetylpyridinium **OR** phenol, ondansetron **OR** ondansetron (ZOFRAN) IV, ondansetron **OR** [DISCONTINUED] ondansetron (ZOFRAN) IV, sorbitol   Vital Signs    Vitals:   05/03/18 0014 05/03/18 0107 05/03/18 0500 05/03/18 0504  BP:    140/64  Pulse:    (!) 56  Resp:    20  Temp:    98 F (36.7 C)  TempSrc:    Oral  SpO2: 94% 97%  94%  Weight:   278 lb (126.1 kg)   Height:        Intake/Output Summary (Last 24 hours) at 05/03/2018 1023 Last data filed at 05/03/2018 0833 Gross per 24 hour  Intake 958 ml  Output 1200 ml  Net -242 ml   Filed  Weights   05/01/18 0605 05/02/18 0356 05/03/18 0500  Weight: 279 lb 5.2 oz (126.7 kg) 278 lb 3.5 oz (126.2 kg) 278 lb (126.1 kg)    Physical Exam   GEN: Elderly gentleman.  Examined in the wheelchair.  Legs are extremely weak.  No acute distress HEENT: Grossly normal.  Neck: Supple, no JVD, carotid bruits, or masses. Cardiac: RRR, no murmurs, rubs, or gallops. No clubbing, cyanosis, .  Radials/DP/PT 2+ and equal bilaterally.  Respiratory:  Respirations regular and unlabored, clear to auscultation bilaterally. GI: Soft, nontender, nondistended, BS + x 4. MS: no deformity or atrophy.  1+ to 2+ bilateral leg edema. Skin: warm and dry, no rash. Neuro: Legs are extremely weak. Psych: AAOx3.  Normal affect.  Labs    Chemistry Recent Labs  Lab 05/01/18 0537 05/02/18 0656 05/03/18 0522  NA 143 144 146*  K 3.9 3.8 3.8  CL 95* 96* 98*  CO2 37* 39* 39*  GLUCOSE 111* 146* 111*  BUN 32* 32* 32*  CREATININE 1.80* 1.84* 1.86*  CALCIUM 8.9 8.7* 8.7*  GFRNONAA 36* 35* 34*  GFRAA 41* 40* 40*  ANIONGAP 11 9 9      Hematology Recent Labs  Lab 04/30/18 0631  WBC 5.0  RBC 3.42*  HGB 10.0*  HCT 32.3*  MCV  94.4  MCH 29.2  MCHC 31.0  RDW 16.6*  PLT 222    Cardiac EnzymesNo results for input(s): TROPONINI in the last 168 hours. No results for input(s): TROPIPOC in the last 168 hours.   BNPNo results for input(s): BNP, PROBNP in the last 168 hours.   DDimer No results for input(s): DDIMER in the last 168 hours.   Radiology    No results found.  Telemetry    ECG    Sinus rhythm- Personally Reviewed  Cardiac Studies   Echocardiogram 02/2018: Study Conclusions  - Left ventricle: The cavity size was normal. Wall thickness was increased in a pattern of mild LVH. Systolic function was vigorous. The estimated ejection fraction was in the range of 65% to 70%. Wall motion was normal; there were no regional wall motion abnormalities. Doppler parameters are consistent  with pseudonormal left ventricular relaxation (grade 2 diastolic dysfunction). The E/A ratio is >1.5. The E/e&' ratio is >15, suggesting elevated LV filling pressure. - Left atrium: The atrium was mildly dilated. - Inferior vena cava: The vessel was normal in size. The respirophasic diameter changes were in the normal range (>= 50%), consistent with normal central venous pressure.  Impressions:  - Compared to a prior study in 2016, there is no significant change. LV filling pressure is elevated.  Patient Profile     72 y.o. male with chronic diastolic heart failure, OSA, hypertension, diabetes and mild pulmonary hypertension admitted 04/08/18 for back surgery and developed increased shortness of breath post operatively. Cardiology following for acute on chronic diastolic CHF.  Assessment & Plan    1.  Acute on chronic diastolic CHF: -Prior echocardiogram 02/2018 with grade 2 diastolic dysfunction -Currently on torsemide 80 mg PO BID -Weight, 278lb>> down from 299lb on admission -I&O, 8.2 L since admission, total 24-hour urine output 1.2 L His creatinine is a little higher than baseline but this is probably normal for him.  Continue current medications.  He seems to be getting better slowly from a rehab standpoint.  He is stable from a cardiac standpoint.  2.  HTN: -Stable, 140/64, 142/68, 138/68 -Hydralazine 50 mg PO TID, metoprolol 50 mg PO BID,    3.  Acute on chronic renal insufficiency: -Creatinine, 1.86 today, 05/03/2018 -Continues to fluctuate, downtrending from admission of 2.64 however, worsening from 04/16/2018 at 1.34. -  4.  Hypokalemia: -Stable, K+ 3.8 today -On daily supplementation  5.  UTI: -Per internal medicine -Treatment with antibiotic therapy  6.  DM2: -SSI -Per primary team  7.  Hypothyroidism: -On Synthroid -Per internal medicine    Mertie Moores, MD  05/03/2018 10:55 AM    [address],   [place], [place]  [address] Pager [phone] Phone: [phone]; Fax: [phone]

## 2018-05-03 NOTE — Progress Notes (Signed)
Physical Therapy Session Note  Patient Details  Name: Travis [name] MRN: [id number] Date of Birth: 07/25/1946  Today's Date: 05/03/2018 PT Individual Time: 1504-1600 PT Individual Time Calculation (min): 56 min   Short Term Goals: Week 3:  PT Short Term Goal 1 (Week 3): Pt will perform bed mobility with min assist  PT Short Term Goal 2 (Week 3): Pt will performed sit<>stand with min assist  PT Short Term Goal 3 (Week 3): Pt will bed<>WC transfer with min assist  PT Short Term Goal 4 (Week 3): Pt will ambulate 66ft with min assist and LRAD without desat <88%   Skilled Therapeutic Interventions/Progress Updates:    Pt seated EOB with NT upon PT arrival, agreeable to therapy tx and denies pain. Pt reports fatigue, attempted to perform sit<>stand from EOB but unable despite assist and elevated bed, x 2 attempts. Pt performed lateral scoot into w/c from bed with min assist, verbal cues for techniques. Pt propelled w/c from room>gym x 100 ft with B UEs and supervision. Pt used kinetron 2 x 3 min for LE strengthening. Pt ambulated x 45 ft and x 51 ft with RW and min assist, mod assist to perform sit<>stand from w/c pushing up with B UEs through armrests. SpO2 >90% after ambulation on 3L O2/min. During ambulation therapist providing verbal cues for upright posture and increased L LE step length. Pt transported back to room and left seated in w/c with needs in reach.   Therapy Documentation Precautions:  Precautions Precautions: Fall, Back, Cervical Restrictions Weight Bearing Restrictions: No   See Function Navigator for Current Functional Status.   Therapy/Group: Individual Therapy  Netta Corrigan, PT, DPT 05/03/2018, 3:46 PM

## 2018-05-04 ENCOUNTER — Inpatient Hospital Stay (HOSPITAL_COMMUNITY): Payer: Medicare Other | Admitting: Physical Therapy

## 2018-05-04 ENCOUNTER — Inpatient Hospital Stay (HOSPITAL_COMMUNITY): Payer: Medicare Other | Admitting: Occupational Therapy

## 2018-05-04 LAB — BASIC METABOLIC PANEL
Anion gap: 7 (ref 5–15)
BUN: 32 mg/dL — AB (ref 6–20)
CHLORIDE: 97 mmol/L — AB (ref 101–111)
CO2: 40 mmol/L — AB (ref 22–32)
CREATININE: 1.85 mg/dL — AB (ref 0.61–1.24)
Calcium: 8.8 mg/dL — ABNORMAL LOW (ref 8.9–10.3)
GFR calc Af Amer: 40 mL/min — ABNORMAL LOW (ref >60)
GFR calc non Af Amer: 34 mL/min — ABNORMAL LOW (ref >60)
GLUCOSE: 159 mg/dL — AB (ref 65–99)
Potassium: 4 mmol/L (ref 3.5–5.1)
SODIUM: 144 mmol/L (ref 135–145)

## 2018-05-04 LAB — GLUCOSE, CAPILLARY
GLUCOSE-CAPILLARY: 175 mg/dL — AB (ref 65–99)
GLUCOSE-CAPILLARY: 128 mg/dL — AB (ref 65–99)
Glucose-Capillary: 128 mg/dL — ABNORMAL HIGH (ref 65–99)
Glucose-Capillary: 195 mg/dL — ABNORMAL HIGH (ref 65–99)

## 2018-05-04 NOTE — Progress Notes (Signed)
Physical Therapy Session Note  Patient Details  Name: Travis [name] MRN: [id number] Date of Birth: 05-Mar-1946  Today's Date: 05/04/2018 PT Individual Time: 7092-9574 and 1420-1500   PT Individual Time Calculation (min): 60 min and 40 min  Short Term Goals: Week 3:  PT Short Term Goal 1 (Week 3): Pt will perform bed mobility with min assist  PT Short Term Goal 2 (Week 3): Pt will performed sit<>stand with min assist  PT Short Term Goal 3 (Week 3): Pt will bed<>WC transfer with min assist  PT Short Term Goal 4 (Week 3): Pt will ambulate 5f with min assist and LRAD without desat <88%   Skilled Therapeutic Interventions/Progress Updates: Tx1: Pt presented in w/c agreeable to therapy. Pt indicating having a "rough morning" with increased difficulty breathing. Per pt thinks just due tdiaphrao increased anxiety. SpO2 85% on 3L supplemental O2. Provided continued edu on use of incentive spirometer and to practice diaphragmatic breathing between therapy sessions. Pt propelled w/c to rehab gym no breaks maintaining SpO2 >88% with activity. Pt performed sit to stand from w/c requiring mod/maxA for boosting/lift off from chair then min guard to come to full standing. Pt ambulated 668fwith RW, min guard and w/c follow, pt required min verbal cues to improve LLE foot clearance. After therapeutic rest performed standing activity tolerance including toe taps with LLE for strengthening. Pt maintained SpO2 >88% with ambulation and activities however required frequent therapeutic breaks due to fatigue. Pt transported back to room due to fatigue and remained in w/c at end of session with call bell within reach and needs met.   Tx2: Pt presented in w/c with nsg present administering meds. Pt propelled w/c to rehab gym on 2L supplemental O2 and maintained saturation at 87% after activity. Pt performed sit to stand from w/c with modA and stand pivot to mat min guard. Participated in seated therex for LE strengthening  including LAQ, hs pulls, hip abd x 10-15 bilaterally. Pt able to maintain SpO2 >88% throughout exercises. Pt returned to w/c in same manner as prior and propelled back to room. Pt remained in w/c at end of session and left with call bell within reach and needs met.      Therapy Documentation Precautions:  Precautions Precautions: Fall, Back, Cervical Restrictions Weight Bearing Restrictions: No General:   Vital Signs: Therapy Vitals Temp: 97.6 F (36.4 C) Temp Source: Oral Pulse Rate: (!) 51 BP: (!) 122/58 Patient Position (if appropriate): Sitting Oxygen Therapy SpO2: 94 % O2 Device: Nasal Cannula O2 Flow Rate (L/min): 2 L/min  See Function Navigator for Current Functional Status.   Therapy/Group: Individual Therapy  Lanina Larranaga  Quantavia Frith, PTA  05/04/2018, 4:09 PM

## 2018-05-04 NOTE — Progress Notes (Signed)
Subjective/Complaints: Patient seen lying bed this morning. He states he slept well overnight. He states he has to use the urinal this morning. He states his urination is improving. He states he is working hard so he can discharge early.  ROS: Denies CP, SOB, N/V/D.   Objective: Vital Signs: Blood pressure 137/61, pulse (!) 57, temperature 98.6 F (37 C), temperature source Oral, resp. rate 20, height 6' 1" (1.854 m), weight 127.2 kg (280 lb 6.8 oz), SpO2 92 %. No results found. Results for orders placed or performed during the hospital encounter of 04/16/18 (from the past 72 hour(s))  Glucose, capillary     Status: Abnormal   Collection Time: 05/01/18 11:59 AM  Result Value Ref Range   Glucose-Capillary 174 (H) 65 - 99 mg/dL  Glucose, capillary     Status: Abnormal   Collection Time: 05/01/18  4:39 PM  Result Value Ref Range   Glucose-Capillary 128 (H) 65 - 99 mg/dL  Glucose, capillary     Status: Abnormal   Collection Time: 05/01/18  9:03 PM  Result Value Ref Range   Glucose-Capillary 133 (H) 65 - 99 mg/dL   Comment 1 Notify RN   Glucose, capillary     Status: Abnormal   Collection Time: 05/02/18  6:48 AM  Result Value Ref Range   Glucose-Capillary 151 (H) 65 - 99 mg/dL  Basic metabolic panel     Status: Abnormal   Collection Time: 05/02/18  6:56 AM  Result Value Ref Range   Sodium 144 135 - 145 mmol/L   Potassium 3.8 3.5 - 5.1 mmol/L   Chloride 96 (L) 101 - 111 mmol/L   CO2 39 (H) 22 - 32 mmol/L   Glucose, Bld 146 (H) 65 - 99 mg/dL   BUN 32 (H) 6 - 20 mg/dL   Creatinine, Ser 1.84 (H) 0.61 - 1.24 mg/dL   Calcium 8.7 (L) 8.9 - 10.3 mg/dL   GFR calc non Af Amer 35 (L) >60 mL/min   GFR calc Af Amer 40 (L) >60 mL/min    Comment: (NOTE) The eGFR has been calculated using the CKD EPI equation. This calculation has not been validated in all clinical situations. eGFR's persistently <60 mL/min signify possible Chronic Kidney Disease.    Anion gap 9 5 - 15    Comment:  Performed at [facility] [address] [place], [place] [address]  Glucose, capillary     Status: Abnormal   Collection Time: 05/02/18 11:51 AM  Result Value Ref Range   Glucose-Capillary 137 (H) 65 - 99 mg/dL  Glucose, capillary     Status: Abnormal   Collection Time: 05/02/18  4:41 PM  Result Value Ref Range   Glucose-Capillary 223 (H) 65 - 99 mg/dL  Glucose, capillary     Status: Abnormal   Collection Time: 05/02/18  8:55 PM  Result Value Ref Range   Glucose-Capillary 105 (H) 65 - 99 mg/dL   Comment 1 Notify RN   Basic metabolic panel     Status: Abnormal   Collection Time: 05/03/18  5:22 AM  Result Value Ref Range   Sodium 146 (H) 135 - 145 mmol/L   Potassium 3.8 3.5 - 5.1 mmol/L   Chloride 98 (L) 101 - 111 mmol/L   CO2 39 (H) 22 - 32 mmol/L   Glucose, Bld 111 (H) 65 - 99 mg/dL   BUN 32 (H) 6 - 20 mg/dL   Creatinine, Ser 1.86 (H) 0.61 - 1.24 mg/dL   Calcium 8.7 (  L) 8.9 - 10.3 mg/dL   GFR calc non Af Amer 34 (L) >60 mL/min   GFR calc Af Amer 40 (L) >60 mL/min    Comment: (NOTE) The eGFR has been calculated using the CKD EPI equation. This calculation has not been validated in all clinical situations. eGFR's persistently <60 mL/min signify possible Chronic Kidney Disease.    Anion gap 9 5 - 15    Comment: Performed at [facility] [address], [place], [place] [address]  Glucose, capillary     Status: Abnormal   Collection Time: 05/03/18  6:24 AM  Result Value Ref Range   Glucose-Capillary 122 (H) 65 - 99 mg/dL  Glucose, capillary     Status: Abnormal   Collection Time: 05/03/18 11:29 AM  Result Value Ref Range   Glucose-Capillary 177 (H) 65 - 99 mg/dL  Glucose, capillary     Status: Abnormal   Collection Time: 05/03/18  4:33 PM  Result Value Ref Range   Glucose-Capillary 119 (H) 65 - 99 mg/dL  Glucose, capillary     Status: Abnormal   Collection Time: 05/03/18  9:00 PM  Result Value Ref Range   Glucose-Capillary 184 (H) 65 - 99 mg/dL  Glucose,  capillary     Status: Abnormal   Collection Time: 05/04/18  6:31 AM  Result Value Ref Range   Glucose-Capillary 175 (H) 65 - 99 mg/dL    Constitutional: No distress . Vital signs reviewed. HENT: Normocephalic.  Atraumatic. Eyes: EOMI. No discharge. Cardiovascular: RRR. No JVD. Respiratory: CTA Bilaterally. Normal effort. GI: BS +. Non-distended. Musc/Skel:  LE edema. No tenderness. Neuro: Alert and Oriented  Motor: 5/5 BUE proximal to distal BLE 3-/5 HF, 4-/5 distally (stable) Skin: Unna boots to bilateral lower extremities Surgical incision with dressing C/D/I  Assessment/Plan: 1. 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 Physiatrist is providing close team supervision and 24 hour management of active medical problems listed below. Physiatrist and rehab team continue to assess barriers to discharge/monitor patient progress toward functional and medical goals. FIM: Function - Bathing Position: Sitting EOB Body parts bathed by patient: Right arm, Left arm, Chest, Abdomen, Front perineal area, Right upper leg, Left upper leg Body parts bathed by helper: Buttocks, Right lower leg, Left lower leg, Back Bathing not applicable: Left lower leg, Right lower leg(Unna boots) Assist Level: (Mod assist)  Function- Upper Body Dressing/Undressing What is the patient wearing?: Pull over shirt/dress Pull over shirt/dress - Perfomed by patient: Thread/unthread right sleeve, Thread/unthread left sleeve, Put head through opening, Pull shirt over trunk Assist Level: Set up Function - Lower Body Dressing/Undressing What is the patient wearing?: Pants, Non-skid slipper socks Position: Sitting EOB Pants- Performed by helper: Thread/unthread right pants leg, Thread/unthread left pants leg, Pull pants up/down Non-skid slipper socks- Performed by helper: Don/doff right sock, Don/doff left sock TED  Hose - Performed by helper: Don/doff right TED hose, Don/doff left TED hose Assist for footwear: Dependant Assist for lower body dressing: Touching or steadying assistance (Pt > 75%)  Function - Toileting Toileting activity did not occur: No continent bowel/bladder event Toileting steps completed by patient: Adjust clothing prior to toileting, Performs perineal hygiene, Adjust clothing after toileting Toileting steps completed by helper: Adjust clothing prior to toileting, Performs perineal hygiene, Adjust clothing after toileting Toileting Assistive Devices: Grab bar or rail Assist level: More than reasonable time  Function - Air cabin crew transfer activity did not  occur: Safety/medical concerns Toilet transfer assistive device: Bedside commode, Mechanical lift Mechanical lift: Stedy Assist level to toilet: Maximal assist (Pt 25 - 49%/lift and lower) Assist level from toilet: Maximal assist (Pt 25 - 49%/lift and lower) Assist level to bedside commode (at bedside): Moderate assist (Pt 50 - 74%/lift or lower) Assist level from bedside commode (at bedside): Moderate assist (Pt 50 - 74%/lift or lower)  Function - Chair/bed transfer Chair/bed transfer method: Lateral scoot Chair/bed transfer assist level: Touching or steadying assistance (Pt > 75%) Chair/bed transfer assistive device: Armrests Mechanical lift: Clarise Cruz Chair/bed transfer details: Verbal cues for safe use of DME/AE, Manual facilitation for placement, Manual facilitation for weight shifting, Verbal cues for precautions/safety  Function - Locomotion: Wheelchair Will patient use wheelchair at discharge?: Yes Type: Manual Max wheelchair distance: 150 Assist Level: Supervision or verbal cues Wheel 50 feet with 2 turns activity did not occur: Safety/medical concerns Assist Level: Supervision or verbal cues Wheel 150 feet activity did not occur: Safety/medical concerns Assist Level: Supervision or verbal cues Turns  around,maneuvers to table,bed, and toilet,negotiates 3% grade,maneuvers on rugs and over doorsills: No Function - Locomotion: Ambulation Ambulation activity did not occur: [name] Assistive device: Walker-rolling Max distance: 51 ft Assist level: Touching or steadying assistance (Pt > 75%) Assist level: Touching or steadying assistance (Pt > 75%) Assist level: Touching or steadying assistance (Pt > 75%)  Function - Comprehension Comprehension: Auditory Comprehension assist level: Follows basic conversation/direction with extra time/assistive device  Function - Expression Expression: Verbal Expression assist level: Expresses complex 90% of the time/cues < 10% of the time  Function - Social Interaction Social Interaction assist level: Interacts appropriately 90% of the time - Needs monitoring or encouragement for participation or interaction.  Function - Problem Solving Problem solving assist level: Solves basic problems with no assist  Function - Memory Memory assist level: Recognizes or recalls 90% of the time/requires cueing < 10% of the time Patient normally able to recall (first 3 days only): Current season, Location of own room, Staff names and faces, That he or she is in a hospital  Medical Problem List and Plan: 1.  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  DVT Prophylaxis/Anticoagulation: SCDs.      Vascular study negative but unna boots limited study 3. Pain Management: Neurontin 400 mg 3 times daily 4. Mood: Lexapro 10 mg daily 5. Neuropsych: This patient is capable of making decisions on his own behalf. 6. Skin/Wound Care: Pressure relief measures.    Keflex started on 5/1 for surgical wound drainage, changed to Zosyn on 5/4-5/10 7. Fluids/Electrolytes/Nutrition: Strict I/Os. Heart healthy diet. Monitor voiding function.  8. OSA with acute hypercarbic respiratory  failure: Has to use BIPAP or CPAP at nights 9. Acute on chronic diastolic CHF:  Improved with lasix which has been discontinued and changed to [facility] which has also been stopped.  Hydralazine dose adjusted. Monitor weights daily with strict I/Os.       Filed Weights   05/02/18 0356 05/03/18 0500 05/04/18 0500  Weight: 126.2 kg (278 lb 3.5 oz) 126.1 kg (278 lb) 127.2 kg (280 lb 6.8 oz)    Stable on 5/7  Managed by cards, appreciate recs 10 Acute on chronic renal failure.   Hydralazine 50 mg 3 times daily   Hold ARB per Cards   Cr 1.86 on 5/6, labs pending for today  Encourage appropriate fluid intake 11. Morbid obesity: BMI 40.46.  12.  Hypoxic encephalopathy:   Resolved    limit sedating medications. Encourage IS. CPAP whenever asleep. 13.  Hypertension.  Lopressor 25 mg twice daily, increased to 50 BID on 5/3.    Controlled on 5/7  Monitor with increased mobility 14.  BPH.  Flomax 0.4 mg daily, Toviaz 8 mg daily.   15.  Diabetes mellitus with peripheral neuropathy.  Hemoglobin A1c 7.7.  SSI.  Check blood sugars before meals and at bedtime   On amaryl at home, resumed. Titrate regimen as needed   Elevated on 5/7, will consider further adjustments tomorrow if necessary 16.  Fevers.  Resolved. Blood cultures no growth.  Urine culture negative. Completed course of Omnicef 17.  Hypothyroidism.  Synthroid 18.  Constipation.  Laxative assistance, increased Senokot to 2 tablets twice a day, continue MiraLAX 1 packet/day   Dulcolax prn 19. Hypokalemia:   Potassium 3.8 on 5/6, labs pending for today   Continue supplementation   Cont to monitor 20. Acute blood loss anemia  Hemoglobin 10.0 on 5/3  Continue to monitor 21. Acute lower UTI  UA+, Ucx with greater than 100,000 Pseudomonas and greater than 40,000 Klebsiella, sensitivities reviewed  Keflex changed to cefepime , Changed to Zosyn on 5/3 due to multidrug resistances  LOS (Days) 18 A FACE TO FACE EVALUATION WAS PERFORMED  Ankit  Lorie Phenix 05/04/2018, 7:51 AM

## 2018-05-04 NOTE — Progress Notes (Signed)
Occupational Therapy Session Note  Patient Details  Name: Travis [name] MRN: [id number] Date of Birth: 05-28-1946  Today's Date: 05/04/2018 OT Individual Time: 2025-4270 OT Individual Time Calculation (min): 60 min    Short Term Goals: Week 3:  OT Short Term Goal 1 (Week 3): Pt will complete bathing with mod assist with use of AE  OT Short Term Goal 2 (Week 3): Pt will complete LB dressing with max assist with use of AE OT Short Term Goal 3 (Week 3): Pt will complete 1/3 toileting steps to decrease burden of care OT Short Term Goal 4 (Week 3): Pt will complete 1 grooming task in standing to increase standing tolerance  Skilled Therapeutic Interventions/Progress Updates:    Treatment session with focus on functional transfers, sit <> stand, and LB self-care tasks.  Pt received upright in bed reporting need to wash up as he had spilled breakfast on himself this AM.  Pt completed bed mobility with min guard.  Sit > stand from elevated EOB with mod assist.  During transitional movement pt with incontinent BM.  Transferred to [facility] with RW with min assist once upright, completed BM.  Attempted sit > stand from [facility] with pt ultimately requiring +2 assist due to weakness and fatigue from multiple attempts.  Engaged in [place] bathing and dressing while seated on BSC with setup for items.  Utilized reacher to thread pants while seated on w/c.  Sit > stand with mod assist from w/c (with arm rests) and therapist assisted with pants over hips due to time constraints and pt fatigue.  Pt left at sink to complete grooming tasks.  Pt dropped to 84-86% on 2L O2 during sit <> stand and transfers during self-care tasks this session.  With rest breaks and pursed lip breathing pt sats returned to low 90s.  Therapy Documentation Precautions:  Precautions Precautions: Fall, Back, Cervical Restrictions Weight Bearing Restrictions: No General:   Vital Signs: Oxygen Therapy SpO2: 92 % O2 Device: Nasal Cannula O2  Flow Rate (L/min): 2 L/min Pain: Pain Assessment Pain Scale: 0-10 Pain Score: 0-No pain  See Function Navigator for Current Functional Status.   Therapy/Group: Individual Therapy  [name] 05/04/2018, 9:49 AM

## 2018-05-04 NOTE — Progress Notes (Addendum)
Progress Note  Patient Name: Travis [name] Date of Encounter: 05/04/2018  Primary Cardiologist: Dr. Grayland Jack  Subjective   Pt feeling well this AM. Reports breathing improved. He was asking about his LE wraps and how they seem to be getting "loose".   Inpatient Medications    Scheduled Meds: . escitalopram  10 mg Oral Daily  . fesoterodine  8 mg Oral Daily  . gabapentin  400 mg Oral TID  . glimepiride  1 mg Oral Q breakfast  . hydrALAZINE  50 mg Oral Q8H  . hydrocerin   Topical Weekly  . insulin aspart  0-15 Units Subcutaneous TID WC  . levothyroxine  175 mcg Oral QAC breakfast  . metoprolol tartrate  50 mg Oral BID  . polyethylene glycol  17 g Oral Daily  . potassium chloride  20 mEq Oral BID  . pramipexole  0.5 mg Oral BID  . senna  2 tablet Oral BID  . tamsulosin  0.4 mg Oral Daily  . torsemide  80 mg Oral BID   Continuous Infusions: . piperacillin-tazobactam (ZOSYN)  IV Stopped (05/04/18 1003)   PRN Meds: acetaminophen **OR** acetaminophen, bisacodyl, menthol-cetylpyridinium **OR** phenol, ondansetron **OR** ondansetron (ZOFRAN) IV, ondansetron **OR** [DISCONTINUED] ondansetron (ZOFRAN) IV, sorbitol   Vital Signs    Vitals:   05/04/18 0000 05/04/18 0432 05/04/18 0500 05/04/18 0700  BP:  137/61    Pulse:  (!) 57    Resp:  20    Temp:  98.6 F (37 C)    TempSrc:  Oral    SpO2: 92% 94%  92%  Weight:   280 lb 6.8 oz (127.2 kg)   Height:        Intake/Output Summary (Last 24 hours) at 05/04/2018 1148 Last data filed at 05/04/2018 0730 Gross per 24 hour  Intake 600 ml  Output 1350 ml  Net -750 ml   Filed Weights   05/02/18 0356 05/03/18 0500 05/04/18 0500  Weight: 278 lb 3.5 oz (126.2 kg) 278 lb (126.1 kg) 280 lb 6.8 oz (127.2 kg)   Physical Exam   General: Obese, NAD Skin: Warm, dry, intact  Head: Normocephalic, atraumatic, clear, moist mucus membranes. Neck: Negative for carotid bruits. No JVD Lungs:Clear to ausculation bilaterally. No  wheezes, rales, or rhonchi. Breathing is unlabored. Cardiovascular: RRR with S1 S2. No murmurs, rubs or gallops Abdomen: Soft, non-tender, non-distended with normoactive bowel sounds. No obvious abdominal masses. MSK: Strength and tone appear normal for age. 5/5 in all extremities Extremities: 2+ BLE edema. No clubbing or cyanosis. DP/PT pulses 2+ bilaterally Neuro: Alert and oriented. No focal deficits. No facial asymmetry. MAE spontaneously. Psych: Responds to questions appropriately with normal affect.    Labs    Chemistry Recent Labs  Lab 05/02/18 0656 05/03/18 0522 05/04/18 0729  NA 144 146* 144  K 3.8 3.8 4.0  CL 96* 98* 97*  CO2 39* 39* 40*  GLUCOSE 146* 111* 159*  BUN 32* 32* 32*  CREATININE 1.84* 1.86* 1.85*  CALCIUM 8.7* 8.7* 8.8*  GFRNONAA 35* 34* 34*  GFRAA 40* 40* 40*  ANIONGAP 9 9 7     Hematology Recent Labs  Lab 04/30/18 0631  WBC 5.0  RBC 3.42*  HGB 10.0*  HCT 32.3*  MCV 94.4  MCH 29.2  MCHC 31.0  RDW 16.6*  PLT 222   Radiology    No results found.  Telemetry    No currently on telemetry - Personally Reviewed  ECG    No new  tracing as of 05/04/18- Personally Reviewed  Cardiac Studies   Echocardiogram 02/2018: Study Conclusions  - Left ventricle: The cavity size was normal. Wall thickness was increased in a pattern of mild LVH. Systolic function was vigorous. The estimated ejection fraction was in the range of 65% to 70%. Wall motion was normal; there were no regional wall motion abnormalities. Doppler parameters are consistent with pseudonormal left ventricular relaxation (grade 2 diastolic dysfunction). The E/A ratio is >1.5. The E/e&' ratio is >15, suggesting elevated LV filling pressure. - Left atrium: The atrium was mildly dilated. - Inferior vena cava: The vessel was normal in size. The respirophasic diameter changes were in the normal range (>= 50%), consistent with normal central venous  pressure.  Impressions:  - Compared to a prior study in 2016, there is no significant change. LV filling pressure is elevated.  Patient Profile     72 y.o. male with chronic diastolic heart failure, OSA, hypertension, diabetes and mild pulmonary hypertension admitted 04/08/18 for back surgery and developed increased shortness of breath post operatively. Cardiology following for acute on chronic diastolic CHF.  Assessment & Plan    1. Acute on chronic diastolic CHF: -Prior echo 02/2018 with grade 2 diastolic dysfunction  -Currently on torsemide 80mg  PO BID -Weight, 280lb today>>>up 2 lb from 05/03/18 -I&O, net negative 8.5L, total 24H output 1.4L -Creatinine today, 1.85 stable from 05/03/18 at 1.86 -Given his consistent weight loss and stable renal function, will continue current regimen   2. HTN: -Stable, 137/61>131/53>133/56  -Hydralazine 50mg  PO TID, metoprolol 50mg  PO BID -Continue current regimen    3. Acute on chronic renal insufficiency: -Creatinine, 1.85 -Continues to fluctuate, however downtrending from admission of 2.64 and improved slightly from yesterday   4. Hypokalemia: -Stable, K+4.0 today  -On daily supplementation   5. UTI: -Per primary  -Treatment with abx  6. DM2: -SSI -Per primary  7. Hypothyroidism: -On synthroid -Per primary    Signed, Kathyrn Drown NP-C [facility] Pager: [phone] 05/04/2018, 11:48 AM     For questions or updates, please contact   Please consult [email or web address] for contact info under Cardiology/STEMI.   Attending Note:   The patient was seen and examined.  Agree with assessment and plan as noted above.  Changes made to the above note as needed.  Patient seen and independently examined with Kathyrn Drown, NP .   We discussed all aspects of the encounter. I agree with the assessment and plan as stated above.  1.   Acute on chronic diastolic CHF:  Pt is improving -/ stable. Continue rehab    I have spent a total  of 40 minutes with patient reviewing hospital  notes , telemetry, EKGs, labs and examining patient as well as establishing an assessment and plan that was discussed with the patient. > 50% of time was spent in direct patient care.    [name], [name], MD, [facility] 05/05/2018, 10:31 AM [address],  [place] Pager [phone]

## 2018-05-04 NOTE — Significant Event (Signed)
Entered room and noticed patient having difficulty breathing.  Patient increasingly SOB, using accessory muscles, and pursed lip breathing.  Patient is currently on room air, however he used 6L o2 with his Bipap last night.  Checked O2 81%-  O2 sats rose to high 80s with pursed lip breathing, however patient states he still feels like he is working too hard to breathe.  Placed patient on 2L O2 via [place] and patients O2 sats rose to low 90's and patient started to feel better.  Patient is now to be working with therapy this morning, updated therapist on patient condition and to monitor oxygenation with activity.  Brita Romp, RN

## 2018-05-04 NOTE — Progress Notes (Signed)
Occupational Therapy Session Note  Patient Details  Name: Travis [name] MRN: [id number] Date of Birth: 18-May-1946  Today's Date: 05/04/2018 OT Individual Time: 1100-1130 OT Individual Time Calculation (min): 30 min    Short Term Goals: Week 1:  OT Short Term Goal 1 (Week 1): Pt will maintain standing tolerance in stedy for20 minutes while performing grooming at sink OT Short Term Goal 1 - Progress (Week 1): Revised due to lack of progress OT Short Term Goal 2 (Week 1): Pt will perform diaphramitic breathing strategies with 1 questioning cue OT Short Term Goal 2 - Progress (Week 1): Met OT Short Term Goal 3 (Week 1): Pt will transfer from sit to stand from elevated surface with max assist to prepare for donning pants. OT Short Term Goal 3 - Progress (Week 1): Met OT Short Term Goal 4 (Week 1): Pt will verbalize 3/3 back precautions with no cues.   OT Short Term Goal 4 - Progress (Week 1): Met Week 2:  OT Short Term Goal 1 (Week 2): Pt will tolerate standing 5 mins in Stedy to complete 2 grooming tasks with increased endurance OT Short Term Goal 1 - Progress (Week 2): Partly met OT Short Term Goal 2 (Week 2): Pt will complete LB dressing with max assist with use of AE OT Short Term Goal 2 - Progress (Week 2): Progressing toward goal OT Short Term Goal 3 (Week 2): Pt will complete toilet transfer with mod assist with LRAD OT Short Term Goal 3 - Progress (Week 2): Met  Skilled Therapeutic Interventions/Progress Updates:    1:1 Focus on squat pivot transfers and therapeutic activity of working UE/LE on Nustep for cardiovascular endurance and overall strengthening. Pt perform 15 min on level 2. Engaged about goals and progress.   Therapy Documentation Precautions:  Precautions Precautions: Fall, Back, Cervical Restrictions Weight Bearing Restrictions: No Pain: No c/o pain  See Function Navigator for Current Functional Status.   Therapy/Group: Individual Therapy  Willeen Cass  [facility] 05/04/2018, 1:49 PM

## 2018-05-04 NOTE — Plan of Care (Signed)
  Problem: SCI BLADDER ELIMINATION Goal: RH STG MANAGE BLADDER WITH ASSISTANCE Description STG Manage Bladder With Assistance. Mod  Outcome: Progressing   Problem: RH SKIN INTEGRITY Goal: RH STG SKIN FREE OF INFECTION/BREAKDOWN Description Remain free of breakdown with mod assist  Outcome: Progressing Goal: RH STG MAINTAIN SKIN INTEGRITY WITH ASSISTANCE Description STG Maintain Skin Integrity With Assistance. Mod  Outcome: Progressing   Problem: SCI BOWEL ELIMINATION Goal: RH STG MANAGE BOWEL WITH ASSISTANCE Description STG Manage Bowel with mod assistance.  Outcome: Progressing Goal: RH STG SCI MANAGE BOWEL WITH MEDICATION WITH ASSISTANCE Description STG SCI Manage bowel with medication with assistance. Outcome: Progressing   Problem: RH SAFETY Goal: RH STG ADHERE TO SAFETY PRECAUTIONS W/ASSISTANCE/DEVICE Description STG Adhere to Safety Precautions With mod Assistance/Device.  Outcome: Progressing   Problem: RH KNOWLEDGE DEFICIT SCI Goal: RH STG INCREASE KNOWLEDGE OF SELF CARE AFTER SCI Description Increase knowledge of self care after SCI with mod assist  Outcome: Progressing   Problem: Consults Goal: RH SPINAL CORD INJURY PATIENT EDUCATION Description  See Patient Education module for education specifics.  Outcome: Progressing

## 2018-05-05 ENCOUNTER — Inpatient Hospital Stay (HOSPITAL_COMMUNITY): Payer: Medicare Other | Admitting: Physical Therapy

## 2018-05-05 ENCOUNTER — Inpatient Hospital Stay (HOSPITAL_COMMUNITY): Payer: Medicare Other | Admitting: Occupational Therapy

## 2018-05-05 ENCOUNTER — Inpatient Hospital Stay (HOSPITAL_COMMUNITY): Payer: Medicare Other

## 2018-05-05 LAB — BASIC METABOLIC PANEL
Anion gap: 7 (ref 5–15)
BUN: 32 mg/dL — ABNORMAL HIGH (ref 6–20)
CO2: 39 mmol/L — AB (ref 22–32)
Calcium: 8.6 mg/dL — ABNORMAL LOW (ref 8.9–10.3)
Chloride: 99 mmol/L — ABNORMAL LOW (ref 101–111)
Creatinine, Ser: 1.69 mg/dL — ABNORMAL HIGH (ref 0.61–1.24)
GFR calc Af Amer: 45 mL/min — ABNORMAL LOW (ref >60)
GFR, EST NON AFRICAN AMERICAN: 38 mL/min — AB (ref >60)
GLUCOSE: 117 mg/dL — AB (ref 65–99)
POTASSIUM: 3.7 mmol/L (ref 3.5–5.1)
Sodium: 145 mmol/L (ref 135–145)

## 2018-05-05 LAB — GLUCOSE, CAPILLARY
GLUCOSE-CAPILLARY: 140 mg/dL — AB (ref 65–99)
Glucose-Capillary: 118 mg/dL — ABNORMAL HIGH (ref 65–99)
Glucose-Capillary: 164 mg/dL — ABNORMAL HIGH (ref 65–99)
Glucose-Capillary: 195 mg/dL — ABNORMAL HIGH (ref 65–99)

## 2018-05-05 NOTE — Progress Notes (Signed)
Subjective/Complaints: Patient seen lying in bed this morning. He states he slept well overnight. He denies complaints.  ROS: denies CP, SOB, N/V/D.   Objective: Vital Signs: Blood pressure (!) 159/68, pulse 69, temperature 99 F (37.2 C), temperature source Oral, resp. rate 20, height _0  (1.854 m), weight 125.9 kg (277 lb 9 oz), SpO2 95 %. No results found. Results for orders placed or performed during the hospital encounter of 04/16/18 (from the past 72 hour(s))  Glucose, capillary     Status: Abnormal   Collection Time: 05/02/18 11:51 AM  Result Value Ref [place]   Glucose-Capillary 137 (H) 65 - 99 mg/dL  Glucose, capillary     Status: Abnormal   Collection Time: 05/02/18  4:41 PM  Result Value Ref Range   Glucose-Capillary 223 (H) 65 - 99 mg/dL  Glucose, capillary     Status: Abnormal   Collection Time: 05/02/18  8:55 PM  Result Value Ref Range   Glucose-Capillary 105 (H) 65 - 99 mg/dL   Comment 1 Notify RN   Basic metabolic panel     Status: Abnormal   Collection Time: 05/03/18  5:22 AM  Result Value Ref Range   Sodium 146 (H) 135 - 145 mmol/L   Potassium 3.8 3.5 - 5.1 mmol/L   Chloride 98 (L) 101 - 111 mmol/L   CO2 39 (H) 22 - 32 mmol/L   Glucose, Bld 111 (H) 65 - 99 mg/dL   BUN 32 (H) 6 - 20 mg/dL   Creatinine, Ser 1.86 (H) 0.61 - 1.24 mg/dL   Calcium 8.7 (L) 8.9 - 10.3 mg/dL   GFR calc non Af Amer 34 (L) >60 mL/min   GFR calc Af Amer 40 (L) >60 mL/min    Comment: (NOTE) The eGFR has been calculated using the CKD EPI equation. This calculation has not been validated in all clinical situations. eGFR's persistently <60 mL/min signify possible Chronic Kidney Disease.    Anion gap 9 5 - 15    Comment: Performed at [facility] [address] [place], [place] [address]  Glucose, capillary     Status: Abnormal   Collection Time: 05/03/18  6:24 AM  Result Value Ref Range   Glucose-Capillary 122 (H) 65 - 99 mg/dL  Glucose, capillary     Status: Abnormal    Collection Time: 05/03/18 11:29 AM  Result Value Ref Range   Glucose-Capillary 177 (H) 65 - 99 mg/dL  Glucose, capillary     Status: Abnormal   Collection Time: 05/03/18  4:33 PM  Result Value Ref Range   Glucose-Capillary 119 (H) 65 - 99 mg/dL  Glucose, capillary     Status: Abnormal   Collection Time: 05/03/18  9:00 PM  Result Value Ref Range   Glucose-Capillary 184 (H) 65 - 99 mg/dL  Glucose, capillary     Status: Abnormal   Collection Time: 05/04/18  6:31 AM  Result Value Ref Range   Glucose-Capillary 175 (H) 65 - 99 mg/dL  Basic metabolic panel     Status: Abnormal   Collection Time: 05/04/18  7:29 AM  Result Value Ref Range   Sodium 144 135 - 145 mmol/L   Potassium 4.0 3.5 - 5.1 mmol/L   Chloride 97 (L) 101 - 111 mmol/L   CO2 40 (H) 22 - 32 mmol/L   Glucose, Bld 159 (H) 65 - 99 mg/dL   BUN 32 (H) 6 - 20 mg/dL   Creatinine, Ser 1.85 (H) 0.61 - 1.24 mg/dL   Calcium  8.8 (L) 8.9 - 10.3 mg/dL   GFR calc non Af Amer 34 (L) >60 mL/min   GFR calc Af Amer 40 (L) >60 mL/min    Comment: (NOTE) The eGFR has been calculated using the CKD EPI equation. This calculation has not been validated in all clinical situations. eGFR's persistently <60 mL/min signify possible Chronic Kidney Disease.    Anion gap 7 5 - 15    Comment: Performed at [facility] [address], [place], [place] [address]  Glucose, capillary     Status: Abnormal   Collection Time: 05/04/18 12:04 PM  Result Value Ref Range   Glucose-Capillary 128 (H) 65 - 99 mg/dL  Glucose, capillary     Status: Abnormal   Collection Time: 05/04/18  4:48 PM  Result Value Ref Range   Glucose-Capillary 195 (H) 65 - 99 mg/dL  Glucose, capillary     Status: Abnormal   Collection Time: 05/04/18 10:59 PM  Result Value Ref Range   Glucose-Capillary 128 (H) 65 - 99 mg/dL  Basic metabolic panel     Status: Abnormal   Collection Time: 05/05/18  6:36 AM  Result Value Ref Range   Sodium 145 135 - 145 mmol/L   Potassium 3.7 3.5  - 5.1 mmol/L   Chloride 99 (L) 101 - 111 mmol/L   CO2 39 (H) 22 - 32 mmol/L   Glucose, Bld 117 (H) 65 - 99 mg/dL   BUN 32 (H) 6 - 20 mg/dL   Creatinine, Ser 1.69 (H) 0.61 - 1.24 mg/dL   Calcium 8.6 (L) 8.9 - 10.3 mg/dL   GFR calc non Af Amer 38 (L) >60 mL/min   GFR calc Af Amer 45 (L) >60 mL/min    Comment: (NOTE) The eGFR has been calculated using the CKD EPI equation. This calculation has not been validated in all clinical situations. eGFR's persistently <60 mL/min signify possible Chronic Kidney Disease.    Anion gap 7 5 - 15    Comment: Performed at [facility] [address], [place], [place] [address]  Glucose, capillary     Status: Abnormal   Collection Time: 05/05/18  7:18 AM  Result Value Ref Range   Glucose-Capillary 118 (H) 65 - 99 mg/dL    Constitutional: No distress . Vital signs reviewed. HENT: Normocephalic.  Atraumatic. Eyes: EOMI. No discharge. Cardiovascular:RRR. No JVD. Respiratory: CTA Bilaterally. Normal effort. GI: BS +. Non-distended. Musc/Skel:  LE edema. No tenderness. Neuro: Alert and Oriented  Motor: 5/5 BUE proximal to distal BLE 3/5 HF, 4/5 distally  Skin: Unna boots to bilateral lower extremities Surgical incision with dressing C/D/I  Assessment/Plan: 1. 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 Physiatrist is providing close team supervision and 24 hour management of active medical problems listed below. Physiatrist and rehab team continue to assess barriers to discharge/monitor patient progress toward functional and medical goals. FIM: Function - Bathing Position: (seated on toilet) Body parts bathed by patient: Right arm, Left arm, Chest, Abdomen, Front perineal area, Right upper leg, Left upper leg Body parts bathed by helper: Buttocks, Back Bathing not applicable: Left lower leg, Right lower leg(Unna boots) Assist Level:  Touching or steadying assistance(Pt > 75%)  Function- Upper Body Dressing/Undressing What is the patient wearing?: Pull over shirt/dress Pull over shirt/dress - Perfomed by patient: Thread/unthread right sleeve, Thread/unthread left sleeve, Put head through opening, Pull shirt over trunk Assist Level: Set up Function -  Lower Body Dressing/Undressing What is the patient wearing?: Pants Position: (seated on BSC) Pants- Performed by patient: Thread/unthread right pants leg, Thread/unthread left pants leg Pants- Performed by helper: Pull pants up/down Non-skid slipper socks- Performed by helper: Don/doff right sock, Don/doff left sock TED Hose - Performed by helper: Don/doff right TED hose, Don/doff left TED hose Assist for footwear: Dependant Assist for lower body dressing: Assistive device(Mod assist) Assistive Device Comment: reacher  Function - Toileting Toileting activity did not occur: No continent bowel/bladder event Toileting steps completed by patient: Adjust clothing prior to toileting, Performs perineal hygiene, Adjust clothing after toileting Toileting steps completed by helper: Adjust clothing prior to toileting, Performs perineal hygiene, Adjust clothing after toileting Toileting Assistive Devices: Grab bar or rail Assist level: Two helpers  Function - Air cabin crew transfer activity did not occur: Safety/medical concerns Toilet transfer assistive device: Bedside commode, Environmental manager lift: Stedy Assist level to toilet: Maximal assist (Pt 25 - 49%/lift and lower) Assist level from toilet: Maximal assist (Pt 25 - 49%/lift and lower) Assist level to bedside commode (at bedside): Moderate assist (Pt 50 - 74%/lift or lower) Assist level from bedside commode (at bedside): 2 helpers  Function - Chair/bed transfer Chair/bed transfer method: Stand pivot Chair/bed transfer assist level: Moderate assist (Pt 50 - 74%/lift or lower) Chair/bed transfer assistive  device: Mechanical lift Mechanical lift: Stedy Chair/bed transfer details: Verbal cues for safe use of DME/AE, Manual facilitation for placement, Manual facilitation for weight shifting, Verbal cues for precautions/safety  Function - Locomotion: Wheelchair Will patient use wheelchair at discharge?: Yes Type: Manual Max wheelchair distance: 150 Assist Level: Supervision or verbal cues Wheel 50 feet with 2 turns activity did not occur: Safety/medical concerns Assist Level: Supervision or verbal cues Wheel 150 feet activity did not occur: Safety/medical concerns Assist Level: Supervision or verbal cues Turns around,maneuvers to table,bed, and toilet,negotiates 3% grade,maneuvers on rugs and over doorsills: No Function - Locomotion: Ambulation Ambulation activity did not occur: [name] Assistive device: Walker-rolling Max distance: 61f Assist level: Touching or steadying assistance (Pt > 75%) Assist level: Touching or steadying assistance (Pt > 75%) Assist level: Touching or steadying assistance (Pt > 75%) Walk 150 feet activity did not occur: Safety/medical concerns  Function - Comprehension Comprehension: Auditory Comprehension assist level: Follows basic conversation/direction with extra time/assistive device  Function - Expression Expression: Verbal Expression assist level: Expresses complex 90% of the time/cues < 10% of the time  Function - Social Interaction Social Interaction assist level: Interacts appropriately 90% of the time - Needs monitoring or encouragement for participation or interaction.  Function - Problem Solving Problem solving assist level: Solves basic problems with no assist  Function - Memory Memory assist level: Recognizes or recalls 90% of the time/requires cueing < 10% of the time Patient normally able to recall (first 3 days only): Current season, Location of own room, Staff names and faces, That he or she is in a hospital  Medical Problem List and  Plan: 1.  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  DVT Prophylaxis/Anticoagulation: SCDs.      Vascular study negative but unna boots limited study 3. Pain Management: Neurontin 400 mg 3 times daily 4. Mood: Lexapro 10 mg daily 5. Neuropsych: This patient is capable of making decisions on his own behalf. 6. Skin/Wound Care: Pressure relief measures.    Keflex started on 5/1 for surgical wound drainage, changed to Zosyn on 5/4-5/10 7. Fluids/Electrolytes/Nutrition:  Strict I/Os. Heart healthy diet. Monitor voiding function.  8. OSA with acute hypercarbic respiratory failure: Has to use BIPAP or CPAP at nights 9. Acute on chronic diastolic CHF:  Improved with lasix which has been discontinued and changed to [facility] which has also been stopped.  Hydralazine dose adjusted. Monitor weights daily with strict I/Os.       Filed Weights   05/03/18 0500 05/04/18 0500 05/05/18 0500  Weight: 126.1 kg (278 lb) 127.2 kg (280 lb 6.8 oz) 125.9 kg (277 lb 9 oz)    Stable on 5/8  Managed by cards, appreciate recs 10 Acute on chronic renal failure.   Hydralazine 50 mg 3 times daily   Hold ARB per Cards   Cr 1.69 on 5/8  Encourage appropriate fluid intake 11. Morbid obesity: BMI 40.46.  12. Hypoxic encephalopathy:   Resolved    limit sedating medications. Encourage IS. CPAP whenever asleep. 13.  Hypertension.  Lopressor 25 mg twice daily, increased to 50 BID on 5/3.    Elevated this a.m., otherwise relatively controlled  Monitor with increased mobility 14.  BPH.  Flomax 0.4 mg daily, Toviaz 8 mg daily.   15.  Diabetes mellitus with peripheral neuropathy.  Hemoglobin A1c 7.7.  SSI.  Check blood sugars before meals and at bedtime   On amaryl at home, resumed. Titrate regimen as needed   Labile on 5/8 16.  Fevers.  Resolved. Blood cultures no growth.  Urine culture negative. Completed course of  Omnicef 17.  Hypothyroidism.  Synthroid 18.  Constipation.  Laxative assistance, increased Senokot to 2 tablets twice a day, continue MiraLAX 1 packet/day   Dulcolax prn 19. Hypokalemia:   Potassium 3.7 on 5/8   Continue supplementation   Cont to monitor 20. Acute blood loss anemia  Hemoglobin 10.0 on 5/3  Labs ordered for tomorrow  Continue to monitor 21. Acute lower UTI  UA+, Ucx with greater than 100,000 Pseudomonas and greater than 40,000 Klebsiella, sensitivities reviewed  Keflex changed to cefepime , Changed to Zosyn on 5/3 due to multidrug resistances  LOS (Days) 19 A FACE TO FACE EVALUATION WAS PERFORMED  Taina Landry Lorie Phenix 05/05/2018, 8:07 AM

## 2018-05-05 NOTE — Progress Notes (Signed)
    [name] is doing well No new issues. Will sign off.  Please call for questions.     Mertie Moores, MD  05/05/2018 11:23 AM    [facility],  [place], [place]  [address] Pager [phone] Phone: [phone]; Fax: [phone]

## 2018-05-05 NOTE — Patient Care Conference (Signed)
Inpatient RehabilitationTeam Conference and Plan of Care Update Date: 05/05/2018   Time: 2:30 PM    Patient Name: Travis [name]      Medical Record Number: [id number]  Date of Birth: February 29, 1946 Sex: Male         Room/Bed: 4M01C/4M01C-01 Payor Info: Payor: [employer]UE [employer]UE [employer] MEDICARE / Plan: BCBS MEDICARE / Product Type: *No Product type* /    Admitting Diagnosis: cervical lumbar myelopathy  Admit Date/Time:  04/16/2018  3:04 PM Admission Comments: No comment available   Primary Diagnosis:  <principal problem not specified> Principal Problem: <principal problem not specified>  Patient Active Problem List   Diagnosis Date Noted  . Poorly controlled type 2 diabetes mellitus with peripheral neuropathy ([place])   . Labile blood pressure   . Labile blood glucose   . Hypokalemia   . Type 2 diabetes mellitus with peripheral neuropathy (HCC)   . Acute on chronic diastolic (congestive) heart failure ([place])   . Myelopathy ([place]) 04/16/2018  . SOB (shortness of breath)   . Hypoxic encephalopathy ([place])   . Benign essential HTN   . Benign prostatic hyperplasia with urinary retention   . Diabetic peripheral neuropathy ([place])   . FUO (fever of unknown origin)   . Atelectasis   . Hypercarbia   . Acute on chronic respiratory failure with hypoxia and hypercapnia (HCC)   . Acute on chronic diastolic heart failure ([place]) 04/09/2018  . Chronic obstructive pulmonary disease ([place])   . DDD (degenerative disc disease), cervical   . Morbid obesity ([place])   . Post-operative pain   . S/P lumbar spinal fusion 04/08/2018  . Paraparesis of both lower limbs ([place]) 11/11/2017  . Myelopathy, spondylogenic, cervical 11/11/2017  . Excessive daytime sleepiness 11/11/2017  . 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 Slow transit constipation   . Diabetes mellitus type 2 in obese ([place])   . 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 Debility 10/08/2017  . Stage 3 chronic kidney disease ([place])   . Spinal stenosis of lumbar  region   . OSA (obstructive sleep apnea)   . Acute blood loss anemia   . Fever 10/02/2017  . Urinary tract infection without hematuria   . Chronic diastolic CHF (congestive heart failure) ([place])   . Elevated troponin I level   . Type II diabetes mellitus ([place]) 08/19/2015  . HCAP (healthcare-associated pneumonia) 08/19/2015  . Hyperthyroidism 08/19/2015  . Hypothyroid 08/19/2015  . AKI (acute kidney injury) ([place]) 08/19/2015  . Blood poisoning   . Restless legs syndrome 03/14/2015  . Shortness of breath 03/09/2015  . Sinus bradycardia 09/21/2014  . Thoracic spondylosis with myelopathy 09/13/2014  . Cervical arthritis with myelopathy 07/18/2014  . Constipation - functional   . CAP (community acquired pneumonia) 07/10/2014  . Sepsis ([place]) 07/10/2014  . HTN (hypertension) 07/10/2014  . OSA on CPAP 07/10/2014  . Acute lower UTI 07/10/2014  . Edema leg 07/10/2014  . Acute respiratory failure with hypoxia ([place]) 07/10/2014  . 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 Other vitamin B12 deficiency anemia 07/13/2013  . 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 Polyneuropathy in other diseases classified elsewhere ([place]) 02/17/2013  . Other general symptoms(780.99) 02/17/2013  . Abnormality of gait 02/17/2013  . Cervical spondylosis with myelopathy 02/17/2013  . Back ache 09/15/2012    Expected Discharge Date: Expected Discharge Date: 05/12/18  Team Members Present: Physician leading conference: Dr. 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 status post decompression arthrodesis  Improve mobility, endurance, SOB, Iv abx, diastolic CHF,  UTI, supplemental O2 dependent  See above   Bowel/Bladder   continent with episodes of incontinence at times, LBM 05/04/18, uses urinal but has spillage at night  less episodes of incontinence, mod assist  continue to monitor & assist as needed   Swallow/Nutrition/ Hydration             ADL's   min assist bathing, mod assist LB dressing at sit > stand level with AE, min-mod assist sit > stand with increased time, setup transfers with slide board  min assist bathing and toilet transfers, mod assist LB dressing and toileting  ADL retraining, transfers, dynamic standing balance, sit <> stand, use of AE to decrease burden of care   Mobility   Intermittent min assist for sit<>stand from WC height and elevated bed. Mod assist with intermittent min assist for sit<>supine transfers. ambulation with RW up to 11ft with min assist . Pt requires 2L/min O2 to maintain SpO2 >88%. overall significant improvement from Eval of total assist for all mobility.   Min assist overall for sit<>stand transfers, bed mobility, and gait for house hold level with RW.   Improved independence with bed mobility and transfers. Improved activity tolerance.      Communication             Safety/Cognition/ Behavioral Observations            Pain   no c/o pain, has tylenol prn  pain scale <2  continue to assess & treat as needed   Skin   incisions to cervical area & lower back, OTA, edema to BLE, has unna boots  no skin breakdown, no signs of infection  assess q shift    Rehab Goals Patient on target to meet rehab goals: Yes *See Care Plan and progress notes for long and short-term goals.     Barriers to Discharge  Current Status/Progress Possible Resolutions Date Resolved   Physician    Medical stability;Weight;Lack of/limited family support     See above  Therapies, Cards recs - will reconsult, follow labs, optimize DM/BP meds, supplement K+, follow weights, abx for UTI+Surgical wound, wean supplemental O2 as dependent       Nursing                  PT                    OT                  SLP                SW                Discharge Planning/Teaching Needs:  Plan home with wife who can provide some assistance and some private duty caregiver support.  TBD   Team Discussion:  Issues with oxygen dropping again;  Up to 4L at times.  Visibly struggles with breath. Has been able to walk up to 30' with no O2 drop and today had issues.  Overall has made good progress as he is now coming sit-stand with min assist but on admission staff had to use STEDY equipment.  Still with edema. Team feels he needs the full ELOS and target d/c 5/15  Revisions to Treatment Plan:  None    Continued Need for Acute Rehabilitation Level of Care: The patient requires daily medical management by a physician with specialized training in physical medicine and rehabilitation for the following conditions: Daily direction of a multidisciplinary physical rehabilitation program to ensure safe treatment while eliciting the highest outcome that is of practical value to the patient.: Yes Daily medical management of patient stability for increased activity during participation in an intensive rehabilitation regime.: Yes Daily analysis of laboratory values and/or radiology reports with any subsequent need for medication adjustment of medical intervention for : Neurological problems;Diabetes problems;Cardiac problems;Blood pressure problems;Renal problems;Wound care problems;Pulmonary problems;Post surgical problems  Rasean Joos 05/05/2018, 3:37 PM

## 2018-05-05 NOTE — Progress Notes (Signed)
Physical Therapy Session Note  Patient Details  Name: Travis [name] MRN: [id number] Date of Birth: May 03, 1946  Today's Date: 05/05/2018 PT Individual Time: 1000-1100 AND 1500-1555 PT Individual Time Calculation (min): 60 min AND 55 min   Short Term Goals: Week 3:  PT Short Term Goal 1 (Week 3): Pt will perform bed mobility with min assist  PT Short Term Goal 2 (Week 3): Pt will performed sit<>stand with min assist  PT Short Term Goal 3 (Week 3): Pt will bed<>WC transfer with min assist  PT Short Term Goal 4 (Week 3): Pt will ambulate 60f with min assist and LRAD without desat <88%   Skilled Therapeutic Interventions/Progress Updates:   Pt received sitting in WC and agreeable to PT  WC mobility x 1568fwith supervision assist, SpO2 measured at 88% following WC mobility.   Sit<>stand trasnfers from various surfaces min-supervision assist from PT with moderate cues from PT for safety and technique.   Gait training instructed by PT x 1291fith min-supervision assist. SpO2 measured following gait at 85% on 2L supplemental O2(see flow sheet)  Sit<>supine blocked practice to the R with mod assist to flat surface and min assist to semirecumbent position to simulate home environment. PT required to facilitate improved LLE positioning and cue sto maintain back precautions.   Stand pivot transfers with min-supervision assist from elevated surface of bed.   [facility] returned to room and [facility] and all needs met.     Session 2.   Pt received sitting in WC and agreeable to PT. WC mobility x [id number] supervision assist from PT. Min cues from PT  For improved pursed lip breathing.   Sit<>stand transfer training x 2 with min-mod assist from PT. Standing tolerance x 3 minutes to perform lateral reaches with the L hand and Horseshoe toss with the RUE.  Patient returned to room and left sitting in WC, BLE elevated, with call bell in reach and all needs met.           Therapy Documentation Precautions:  Precautions Precautions: Fall, Back, Cervical Restrictions Weight Bearing Restrictions: No Vital Signs: Therapy Vitals Temp: 99 F (37.2 C) Temp Source: Oral Pulse Rate: 62 Resp: 20 BP: 133/62 Patient Position (if appropriate): standing(walking) Oxygen Therapy SpO2: 85 % O2 device: Nasal canula  See Function Navigator for Current Functional Status.   Therapy/Group: Individual Therapy  AustLorie Phenix/2019, 4:34 PM

## 2018-05-05 NOTE — Progress Notes (Signed)
Occupational Therapy Session Note  Patient Details  Name: Travis [name] MRN: [id number] Date of Birth: 08-31-1946  Today's Date: 05/05/2018 OT Individual Time: 3536-1443 OT Individual Time Calculation (min): 60 min    Short Term Goals: Week 3:  OT Short Term Goal 1 (Week 3): Pt will complete bathing with mod assist with use of AE  OT Short Term Goal 2 (Week 3): Pt will complete LB dressing with max assist with use of AE OT Short Term Goal 3 (Week 3): Pt will complete 1/3 toileting steps to decrease burden of care OT Short Term Goal 4 (Week 3): Pt will complete 1 grooming task in standing to increase standing tolerance  Skilled Therapeutic Interventions/Progress Updates:    Treatment session with focus on functional transfers, sit <> stand, and dynamic standing balance for LB dressing.  Pt received upright in bed reporting feeling better this AM with decreased SOB.  O2 sats monitored throughout session on 2L supplemental O2 with sats dropping to 84-87% with activity but returning to 90-92% with rest break.  Pt completed bed mobility with supervision with use of bed rails, reports having an adjustable bed at home.  Slide board transfer to w/c with setup for appropriate placement and min guard during transfer (no physical assist).  Engaged in [place] seated on w/c with use of reacher.  Pt able to thread BLE into pants and completed sit > stand with mod assist.  Once standing pt able to pull pants over hips with min assist for standing balance.  Engaged in sit > stand x2 with pt progressing to min assist for standing, requires increased time for weight shift but able to transition UE from w/c arm rests to RW without any lifting assistance during transitional movement.  Pt propelled w/c to sink, completed grooming tasks without assist.  Pt left upright in w/c with BLE elevated on chair to continue to address edema.  Therapy Documentation Precautions:  Precautions Precautions: Fall, Back,  Cervical Restrictions Weight Bearing Restrictions: No General:   Vital Signs: Therapy Vitals Temp: 99 F (37.2 C) Temp Source: Oral Pulse Rate: 69 BP: (!) 159/68 Patient Position (if appropriate): Lying Oxygen Therapy SpO2: 91 % O2 Device: Nasal Cannula O2 Flow Rate (L/min): 2 L/min Pain:  Pt with no c/o pain  See Function Navigator for Current Functional Status.   Therapy/Group: Individual Therapy  [name] 05/05/2018, 9:02 AM

## 2018-05-05 NOTE — Plan of Care (Signed)
  Problem: SCI BLADDER ELIMINATION Goal: RH STG MANAGE BLADDER WITH ASSISTANCE Description STG Manage Bladder With Assistance. Mod  Outcome: Progressing   Problem: RH SKIN INTEGRITY Goal: RH STG SKIN FREE OF INFECTION/BREAKDOWN Description Remain free of breakdown with mod assist  Outcome: Progressing Goal: RH STG MAINTAIN SKIN INTEGRITY WITH ASSISTANCE Description STG Maintain Skin Integrity With Assistance. Mod  Outcome: Progressing   Problem: SCI BOWEL ELIMINATION Goal: RH STG MANAGE BOWEL WITH ASSISTANCE Description STG Manage Bowel with mod assistance.  Outcome: Progressing Goal: RH STG SCI MANAGE BOWEL WITH MEDICATION WITH ASSISTANCE Description STG SCI Manage bowel with medication with assistance. Outcome: Progressing   Problem: RH SAFETY Goal: RH STG ADHERE TO SAFETY PRECAUTIONS W/ASSISTANCE/DEVICE Description STG Adhere to Safety Precautions With mod Assistance/Device.  Outcome: Progressing   Problem: RH KNOWLEDGE DEFICIT SCI Goal: RH STG INCREASE KNOWLEDGE OF SELF CARE AFTER SCI Description Increase knowledge of self care after SCI with mod assist  Outcome: Progressing   Problem: Consults Goal: RH SPINAL CORD INJURY PATIENT EDUCATION Description  See Patient Education module for education specifics.  Outcome: Progressing

## 2018-05-05 NOTE — Progress Notes (Addendum)
Physical Therapy Note  Patient Details  Name: Travis [name] MRN: [id number] Date of Birth: Apr 08, 1946 Today's Date: 05/05/2018  1420-1500, 40 min individual tx Pain: " a little bit" L hip, declined meds  Pt noted to be SOB at rest, sitting in w/c on 2L O2 via [place], using green tubing.  Pt instructed pt and wife in diaphragmatic breathing; pt demonstrated poor abdominal movement, even with lumbar corset unfastened in sitting.  Respiratory rate 36.  O2 sats 90% at rest.  With deep breathing, pt able to get it to 92%. Pam, PA examined pt and noted crackles which decreased with deep breathing.  Pt used incentive spirometer, with ability to bring bubble to 1000 ml mark 3/5 trials.  Sit> stand at stairs, pulling up with bil hands. Pt stood x 2-3 minutes during wt shifting L><R, focusing on upright posture and forward gaze; rested several minutes in sitting- O2 sats 92%; stood again to perform mini- squats x 10, with Yoga block between feet for better LE alignment. After exertion, O2 sats 96%.   W/c propulsion to return to room.  Pt left resting in w/c with wife present, and all needs within reach.  See function navigator for current status.  Lataunya Ruud 05/05/2018, 10:01 AM

## 2018-05-06 ENCOUNTER — Inpatient Hospital Stay (HOSPITAL_COMMUNITY): Payer: Medicare Other | Admitting: Occupational Therapy

## 2018-05-06 ENCOUNTER — Inpatient Hospital Stay (HOSPITAL_COMMUNITY): Payer: Medicare Other | Admitting: Physical Therapy

## 2018-05-06 ENCOUNTER — Inpatient Hospital Stay (HOSPITAL_COMMUNITY): Payer: Medicare Other

## 2018-05-06 DIAGNOSIS — R0602 Shortness of breath: Secondary | ICD-10-CM

## 2018-05-06 DIAGNOSIS — D696 Thrombocytopenia, unspecified: Secondary | ICD-10-CM

## 2018-05-06 LAB — BASIC METABOLIC PANEL
Anion gap: 8 (ref 5–15)
BUN: 34 mg/dL — AB (ref 6–20)
CHLORIDE: 98 mmol/L — AB (ref 101–111)
CO2: 39 mmol/L — AB (ref 22–32)
CREATININE: 1.79 mg/dL — AB (ref 0.61–1.24)
Calcium: 8.7 mg/dL — ABNORMAL LOW (ref 8.9–10.3)
GFR calc Af Amer: 42 mL/min — ABNORMAL LOW (ref >60)
GFR calc non Af Amer: 36 mL/min — ABNORMAL LOW (ref >60)
GLUCOSE: 142 mg/dL — AB (ref 65–99)
Potassium: 3.7 mmol/L (ref 3.5–5.1)
SODIUM: 145 mmol/L (ref 135–145)

## 2018-05-06 LAB — CBC WITH DIFFERENTIAL/PLATELET
Basophils Absolute: 0 10*3/uL (ref 0.0–0.1)
Basophils Relative: 0 %
EOS ABS: 0.2 10*3/uL (ref 0.0–0.7)
EOS PCT: 2 %
HCT: 31.4 % — ABNORMAL LOW (ref 39.0–52.0)
Hemoglobin: 9.3 g/dL — ABNORMAL LOW (ref 13.0–17.0)
LYMPHS ABS: 0.9 10*3/uL (ref 0.7–4.0)
Lymphocytes Relative: 15 %
MCH: 28.4 pg (ref 26.0–34.0)
MCHC: 29.6 g/dL — AB (ref 30.0–36.0)
MCV: 95.7 fL (ref 78.0–100.0)
MONO ABS: 0.5 10*3/uL (ref 0.1–1.0)
MONOS PCT: 9 %
Neutro Abs: 4.6 10*3/uL (ref 1.7–7.7)
Neutrophils Relative %: 74 %
PLATELETS: 140 10*3/uL — AB (ref 150–400)
RBC: 3.28 MIL/uL — ABNORMAL LOW (ref 4.22–5.81)
RDW: 16.3 % — ABNORMAL HIGH (ref 11.5–15.5)
WBC: 6.1 10*3/uL (ref 4.0–10.5)

## 2018-05-06 LAB — GLUCOSE, CAPILLARY
GLUCOSE-CAPILLARY: 139 mg/dL — AB (ref 65–99)
Glucose-Capillary: 116 mg/dL — ABNORMAL HIGH (ref 65–99)
Glucose-Capillary: 213 mg/dL — ABNORMAL HIGH (ref 65–99)
Glucose-Capillary: 136 mg/dL — ABNORMAL HIGH (ref 65–99)

## 2018-05-06 NOTE — Progress Notes (Signed)
Subjective/Complaints: Patient seen  He states he slept well overnight. He states he has a cramp in his leg.  ROS: + SOB. denies CP, N/V/D.   Objective: Vital Signs: Blood pressure (!) 134/59, pulse (!) 59, temperature 98.7 F (37.1 C), temperature source Oral, resp. rate 19, height _0  (1.854 m), weight 126 kg (277 lb 12.5 oz), SpO2 92 %. No results found. Results for orders placed or performed during the hospital encounter of 04/16/18 (from the past 72 hour(s))  Glucose, capillary     Status: Abnormal   Collection Time: 05/03/18 11:29 AM  Result Value Ref Range   Glucose-Capillary 177 (H) 65 - 99 mg/dL  Glucose, capillary     Status: Abnormal   Collection Time: 05/03/18  4:33 PM  Result Value Ref Range   Glucose-Capillary 119 (H) 65 - 99 mg/dL  Glucose, capillary     Status: Abnormal   Collection Time: 05/03/18  9:00 PM  Result Value Ref Range   Glucose-Capillary 184 (H) 65 - 99 mg/dL  Glucose, capillary     Status: Abnormal   Collection Time: 05/04/18  6:31 AM  Result Value Ref Range   Glucose-Capillary 175 (H) 65 - 99 mg/dL  Basic metabolic panel     Status: Abnormal   Collection Time: 05/04/18  7:29 AM  Result Value Ref Range   Sodium 144 135 - 145 mmol/L   Potassium 4.0 3.5 - 5.1 mmol/L   Chloride 97 (L) 101 - 111 mmol/L   CO2 40 (H) 22 - 32 mmol/L   Glucose, Bld 159 (H) 65 - 99 mg/dL   BUN 32 (H) 6 - 20 mg/dL   Creatinine, Ser 1.85 (H) 0.61 - 1.24 mg/dL   Calcium 8.8 (L) 8.9 - 10.3 mg/dL   GFR calc non Af Amer 34 (L) >60 mL/min   GFR calc Af Amer 40 (L) >60 mL/min    Comment: (NOTE) The eGFR has been calculated using the CKD EPI equation. This calculation has not been validated in all clinical situations. eGFR's persistently <60 mL/min signify possible Chronic Kidney Disease.    Anion gap 7 5 - 15    Comment: Performed at [facility] [address], [place], [place] [address]  Glucose, capillary     Status: Abnormal   Collection Time: 05/04/18 12:04  PM  Result Value Ref Range   Glucose-Capillary 128 (H) 65 - 99 mg/dL  Glucose, capillary     Status: Abnormal   Collection Time: 05/04/18  4:48 PM  Result Value Ref Range   Glucose-Capillary 195 (H) 65 - 99 mg/dL  Glucose, capillary     Status: Abnormal   Collection Time: 05/04/18 10:59 PM  Result Value Ref Range   Glucose-Capillary 128 (H) 65 - 99 mg/dL  Basic metabolic panel     Status: Abnormal   Collection Time: 05/05/18  6:36 AM  Result Value Ref Range   Sodium 145 135 - 145 mmol/L   Potassium 3.7 3.5 - 5.1 mmol/L   Chloride 99 (L) 101 - 111 mmol/L   CO2 39 (H) 22 - 32 mmol/L   Glucose, Bld 117 (H) 65 - 99 mg/dL   BUN 32 (H) 6 - 20 mg/dL   Creatinine, Ser 1.69 (H) 0.61 - 1.24 mg/dL   Calcium 8.6 (L) 8.9 - 10.3 mg/dL   GFR calc non Af Amer 38 (L) >60 mL/min   GFR calc Af Amer 45 (L) >60 mL/min    Comment: (NOTE) The eGFR has been  calculated using the CKD EPI equation. This calculation has not been validated in all clinical situations. eGFR's persistently <60 mL/min signify possible Chronic Kidney Disease.    Anion gap 7 5 - 15    Comment: Performed at [facility] [address], [place], [place] [address]  Glucose, capillary     Status: Abnormal   Collection Time: 05/05/18  7:18 AM  Result Value Ref Range   Glucose-Capillary 118 (H) 65 - 99 mg/dL  Glucose, capillary     Status: Abnormal   Collection Time: 05/05/18 11:26 AM  Result Value Ref Range   Glucose-Capillary 164 (H) 65 - 99 mg/dL  Glucose, capillary     Status: Abnormal   Collection Time: 05/05/18  5:16 PM  Result Value Ref Range   Glucose-Capillary 195 (H) 65 - 99 mg/dL  Glucose, capillary     Status: Abnormal   Collection Time: 05/05/18  9:15 PM  Result Value Ref Range   Glucose-Capillary 140 (H) 65 - 99 mg/dL   Comment 1 Notify RN   Basic metabolic panel     Status: Abnormal   Collection Time: 05/06/18  5:00 AM  Result Value Ref Range   Sodium 145 135 - 145 mmol/L   Potassium 3.7 3.5 - 5.1  mmol/L   Chloride 98 (L) 101 - 111 mmol/L   CO2 39 (H) 22 - 32 mmol/L   Glucose, Bld 142 (H) 65 - 99 mg/dL   BUN 34 (H) 6 - 20 mg/dL   Creatinine, Ser 1.79 (H) 0.61 - 1.24 mg/dL   Calcium 8.7 (L) 8.9 - 10.3 mg/dL   GFR calc non Af Amer 36 (L) >60 mL/min   GFR calc Af Amer 42 (L) >60 mL/min    Comment: (NOTE) The eGFR has been calculated using the CKD EPI equation. This calculation has not been validated in all clinical situations. eGFR's persistently <60 mL/min signify possible Chronic Kidney Disease.    Anion gap 8 5 - 15    Comment: Performed at [facility] [address] [place], [place] [address]  CBC with Differential/Platelet     Status: Abnormal   Collection Time: 05/06/18  5:00 AM  Result Value Ref Range   WBC 6.1 4.0 - 10.5 K/uL   RBC 3.28 (L) 4.22 - 5.81 MIL/uL   Hemoglobin 9.3 (L) 13.0 - 17.0 g/dL   HCT 31.4 (L) 39.0 - 52.0 %   MCV 95.7 78.0 - 100.0 fL   MCH 28.4 26.0 - 34.0 pg   MCHC 29.6 (L) 30.0 - 36.0 g/dL   RDW 16.3 (H) 11.5 - 15.5 %   Platelets 140 (L) 150 - 400 K/uL   Neutrophils Relative % 74 %   Neutro Abs 4.6 1.7 - 7.7 K/uL   Lymphocytes Relative 15 %   Lymphs Abs 0.9 0.7 - 4.0 K/uL   Monocytes Relative 9 %   Monocytes Absolute 0.5 0.1 - 1.0 K/uL   Eosinophils Relative 2 %   Eosinophils Absolute 0.2 0.0 - 0.7 K/uL   Basophils Relative 0 %   Basophils Absolute 0.0 0.0 - 0.1 K/uL    Comment: Performed at [facility], [address], [place], [place] [address]  Glucose, capillary     Status: Abnormal   Collection Time: 05/06/18  6:28 AM  Result Value Ref Range   Glucose-Capillary 139 (H) 65 - 99 mg/dL   Comment 1 Notify RN     Constitutional: No distress . Vital signs reviewed.  HENT: Normocephalic.  Atraumatic. Eyes: EOMI. No discharge. Cardiovascular: RRR. No JVD. Respiratory: CTA Bilaterally. Increased WOB. +[place]. GI: BS +. Non-distended. Musc/Skel:  LE edema. No tenderness. Neuro: Alert and Oriented  Motor: 5/5 BUE proximal to  distal BLE 3/5 HF, 4/5 distally  (stable) Skin: Unna boots to bilateral lower extremities Surgical incision with dressing C/D/I  Assessment/Plan: 1. 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 Physiatrist is providing close team supervision and 24 hour management of active medical problems listed below. Physiatrist and rehab team continue to assess barriers to discharge/monitor patient progress toward functional and medical goals. FIM: Function - Bathing Position: (seated on toilet) Body parts bathed by patient: Right arm, Left arm, Chest, Abdomen, Front perineal area, Right upper leg, Left upper leg Body parts bathed by helper: Buttocks, Back Bathing not applicable: Left lower leg, Right lower leg(Unna boots) Assist Level: Touching or steadying assistance(Pt > 75%)  Function- Upper Body Dressing/Undressing What is the patient wearing?: Pull over shirt/dress Pull over shirt/dress - Perfomed by patient: Thread/unthread right sleeve, Thread/unthread left sleeve, Put head through opening, Pull shirt over trunk Assist Level: Set up Function - Lower Body Dressing/Undressing What is the patient wearing?: Pants, Non-skid slipper socks Position: Wheelchair/chair at sink Pants- Performed by patient: Thread/unthread right pants leg, Thread/unthread left pants leg, Pull pants up/down Pants- Performed by helper: Pull pants up/down Non-skid slipper socks- Performed by helper: Don/doff right sock, Don/doff left sock TED Hose - Performed by helper: Don/doff right TED hose, Don/doff left TED hose Assist for footwear: Maximal assist Assist for lower body dressing: Assistive device(Mod assist) Assistive Device Comment: reacher  Function - Toileting Toileting activity did not occur: No continent bowel/bladder event Toileting steps completed by patient: Adjust clothing prior to toileting, Performs  perineal hygiene, Adjust clothing after toileting Toileting steps completed by helper: Adjust clothing prior to toileting, Performs perineal hygiene, Adjust clothing after toileting Toileting Assistive Devices: Grab bar or rail Assist level: Two helpers  Function - Air cabin crew transfer activity did not occur: Safety/medical concerns Toilet transfer assistive device: Bedside commode, Environmental manager lift: Stedy Assist level to toilet: Maximal assist (Pt 25 - 49%/lift and lower) Assist level from toilet: Maximal assist (Pt 25 - 49%/lift and lower) Assist level to bedside commode (at bedside): Moderate assist (Pt 50 - 74%/lift or lower) Assist level from bedside commode (at bedside): 2 helpers  Function - Chair/bed transfer Chair/bed transfer method: Stand pivot Chair/bed transfer assist level: Maximal assist (Pt 25 - 49%/lift and lower) Chair/bed transfer assistive device: Walker Mechanical lift: Stedy Chair/bed transfer details: Verbal cues for safe use of DME/AE, Manual facilitation for placement, Manual facilitation for weight shifting, Verbal cues for precautions/safety  Function - Locomotion: Wheelchair Will patient use wheelchair at discharge?: Yes Type: Manual Max wheelchair distance: 120 Assist Level: Supervision or verbal cues Wheel 50 feet with 2 turns activity did not occur: Safety/medical concerns Assist Level: Supervision or verbal cues Wheel 150 feet activity did not occur: Safety/medical concerns Assist Level: Supervision or verbal cues Turns around,maneuvers to table,bed, and toilet,negotiates 3% grade,maneuvers on rugs and over doorsills: No Function - Locomotion: Ambulation Ambulation activity did not occur: [name] Assistive device: Walker-rolling Max distance: 52f Assist level: Touching or steadying assistance (Pt > 75%) Assist level: Touching or steadying assistance (Pt > 75%) Assist level: Touching or steadying assistance (Pt > 75%) Walk 150 feet  activity did not occur: Safety/medical concerns  Function - Comprehension  Comprehension: Auditory Comprehension assist level: Follows basic conversation/direction with extra time/assistive device  Function - Expression Expression: Verbal Expression assist level: Expresses complex 90% of the time/cues < 10% of the time  Function - Social Interaction Social Interaction assist level: Interacts appropriately 90% of the time - Needs monitoring or encouragement for participation or interaction.  Function - Problem Solving Problem solving assist level: Solves basic problems with no assist  Function - Memory Memory assist level: Recognizes or recalls 90% of the time/requires cueing < 10% of the time Patient normally able to recall (first 3 days only): Current season, Location of own room, Staff names and faces, That he or she is in a hospital  Medical Problem List and Plan: 1.  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  DVT Prophylaxis/Anticoagulation: SCDs.      Vascular study negative but unna boots limited study 3. Pain Management: Neurontin 400 mg 3 times daily 4. Mood: Lexapro 10 mg daily 5. Neuropsych: This patient is capable of making decisions on his own behalf. 6. Skin/Wound Care: Pressure relief measures.    Keflex started on 5/1 for surgical wound drainage, changed to Zosyn on 5/4-5/10 7. Fluids/Electrolytes/Nutrition: Strict I/Os. Heart healthy diet. Monitor voiding function.  8. OSA with acute hypercarbic respiratory failure: Has to use BIPAP or CPAP at nights  Wearing supplemental oxygen as tolerated  Discussed with team yesterday the patient have a increasing desaturations as well as patient complaining of SOB, will order CXR 9. Acute on chronic diastolic CHF:  Improved with lasix which has been discontinued and changed to [facility] which has also been stopped.  Hydralazine  dose adjusted. Monitor weights daily with strict I/Os.       Filed Weights   05/04/18 0500 05/05/18 0500 05/06/18 0535  Weight: 127.2 kg (280 lb 6.8 oz) 125.9 kg (277 lb 9 oz) 126 kg (277 lb 12.5 oz)    Stable on 5/9  Managed by cards, appreciate recs, signed off, will reconsult 10 Acute on chronic renal failure.   Hydralazine 50 mg 3 times daily   Hold ARB per Cards   Cr 1.79 on 5/9  Encourage appropriate fluid intake 11. Morbid obesity: BMI 40.46.  12. Hypoxic encephalopathy:   Resolved    limit sedating medications. Encourage IS. CPAP whenever asleep. 13.  Hypertension.  Lopressor 25 mg twice daily, increased to 50 BID on 5/3.    Relatively controlled on 5/9  Monitor with increased mobility 14.  BPH.  Flomax 0.4 mg daily, Toviaz 8 mg daily.   15.  Diabetes mellitus with peripheral neuropathy.  Hemoglobin A1c 7.7.  SSI.  Check blood sugars before meals and at bedtime   On amaryl at home, resumed. Titrate regimen as needed   Slightly elevated on 5/9 16.  Fevers.  Resolved. Blood cultures no growth.  Urine culture negative. Completed course of Omnicef 17.  Hypothyroidism.  Synthroid 18.  Constipation.  Laxative assistance, increased Senokot to 2 tablets twice a day, continue MiraLAX 1 packet/day   Dulcolax prn 19. Hypokalemia:   Potassium 3.7 on 5/9   Continue supplementation   Cont to monitor 20. Acute blood loss anemia  Hemoglobin 9.3 on 5/9  Labs ordered for tomorrow  Continue to monitor 21. Acute lower UTI  UA+, Ucx with greater than 100,000 Pseudomonas and greater than 40,000 Klebsiella, sensitivities reviewed  Keflex changed to cefepime , Changed to Zosyn on 5/3 due to multidrug resistances 22.  Thrombocytopenia  Platelets 140 on 5/9  Labs ordered for tomorrow  LOS (Days) 20 A FACE TO FACE EVALUATION WAS PERFORMED  Cornelia Walraven Lorie Phenix 05/06/2018, 7:43 AM

## 2018-05-06 NOTE — Progress Notes (Signed)
Progress Note  Patient Name: Travis [name] Date of Encounter: 05/06/2018  Primary Cardiologist: Dr. Grayland Jack  Subjective   Having more hypoxemia  I/O are still negative -8.5 liters    Inpatient Medications    Scheduled Meds: . escitalopram  10 mg Oral Daily  . fesoterodine  8 mg Oral Daily  . gabapentin  400 mg Oral TID  . glimepiride  1 mg Oral Q breakfast  . hydrALAZINE  50 mg Oral Q8H  . hydrocerin   Topical Weekly  . insulin aspart  0-15 Units Subcutaneous TID WC  . levothyroxine  175 mcg Oral QAC breakfast  . metoprolol tartrate  50 mg Oral BID  . polyethylene glycol  17 g Oral Daily  . potassium chloride  20 mEq Oral BID  . pramipexole  0.5 mg Oral BID  . senna  2 tablet Oral BID  . tamsulosin  0.4 mg Oral Daily  . torsemide  80 mg Oral BID   Continuous Infusions: . piperacillin-tazobactam (ZOSYN)  IV Stopped (05/06/18 1000)   PRN Meds: acetaminophen **OR** acetaminophen, bisacodyl, menthol-cetylpyridinium **OR** phenol, ondansetron **OR** ondansetron (ZOFRAN) IV, ondansetron **OR** [DISCONTINUED] ondansetron (ZOFRAN) IV, sorbitol   Vital Signs    Vitals:   05/05/18 2113 05/05/18 2127 05/05/18 2128 05/06/18 0535  BP: (!) 134/57   (!) 134/59  Pulse: 61 67 67 (!) 59  Resp: 18 20 18 19   Temp: 97.9 F (36.6 C)   98.7 F (37.1 C)  TempSrc: Oral   Oral  SpO2: 93% (!) 87% 92% 92%  Weight:    277 lb 12.5 oz (126 kg)  Height:        Intake/Output Summary (Last 24 hours) at 05/06/2018 1343 Last data filed at 05/06/2018 1022 Gross per 24 hour  Intake 420 ml  Output 1575 ml  Net -1155 ml   Filed Weights   05/04/18 0500 05/05/18 0500 05/06/18 0535  Weight: 280 lb 6.8 oz (127.2 kg) 277 lb 9 oz (125.9 kg) 277 lb 12.5 oz (126 kg)   Physical Exam   General: obese, pleasant  Skin: Warm, dry, intact  Head: Normocephalic, atraumatic, clear, moist mucus membranes. Neck: Negative for carotid bruits. No JVD Lungs:reduced breath sounds , especially left  side  Cardiovascular:  RR  Abdomen: obese, non tender  MSK: Strength and tone appear normal for age. 5/5 in all extremities Extremities:  2+ bilateral edema legs are wrapped  Neuro: Alert and oriented. No focal deficits. No facial asymmetry. MAE spontaneously. Psych: Responds to questions appropriately with normal affect.    Labs    Chemistry Recent Labs  Lab 05/04/18 0729 05/05/18 0636 05/06/18 0500  NA 144 145 145  K 4.0 3.7 3.7  CL 97* 99* 98*  CO2 40* 39* 39*  GLUCOSE 159* 117* 142*  BUN 32* 32* 34*  CREATININE 1.85* 1.69* 1.79*  CALCIUM 8.8* 8.6* 8.7*  GFRNONAA 34* 38* 36*  GFRAA 40* 45* 42*  ANIONGAP 7 7 8     Hematology Recent Labs  Lab 04/30/18 0631 05/06/18 0500  WBC 5.0 6.1  RBC 3.42* 3.28*  HGB 10.0* 9.3*  HCT 32.3* 31.4*  MCV 94.4 95.7  MCH 29.2 28.4  MCHC 31.0 29.6*  RDW 16.6* 16.3*  PLT 222 140*   Radiology    Dg Chest 2 View  Result Date: 05/06/2018 CLINICAL DATA:  Postoperative evaluation for lumbar surgery. EXAM: CHEST - 2 VIEW COMPARISON:  Chest radiograph 04/16/2018 FINDINGS: Cervical and midthoracic spinal fusion hardware. Stable  cardiomegaly. Low lung volumes. Bibasilar atelectasis. No large area pulmonary consolidation. No pleural effusion or pneumothorax. Healing left rib fracture. IMPRESSION: Low lung volumes with basilar atelectasis. Electronically Signed   By: Lovey Newcomer M.D.   On: 05/06/2018 11:02    Telemetry    No currently on telemetry - Personally Reviewed  ECG    No new tracing as of 05/04/18- Personally Reviewed  Cardiac Studies   Echocardiogram 02/2018: Study Conclusions  - Left ventricle: The cavity size was normal. Wall thickness was increased in a pattern of mild LVH. Systolic function was vigorous. The estimated ejection fraction was in the range of 65% to 70%. Wall motion was normal; there were no regional wall motion abnormalities. Doppler parameters are consistent with pseudonormal left ventricular  relaxation (grade 2 diastolic dysfunction). The E/A ratio is >1.5. The E/e&' ratio is >15, suggesting elevated LV filling pressure. - Left atrium: The atrium was mildly dilated. - Inferior vena cava: The vessel was normal in size. The respirophasic diameter changes were in the normal range (>= 50%), consistent with normal central venous pressure.  Impressions:  - Compared to a prior study in 2016, there is no significant change. LV filling pressure is elevated.  Patient Profile     72 y.o. male with chronic diastolic heart failure, OSA, hypertension, diabetes and mild pulmonary hypertension admitted 04/08/18 for back surgery and developed increased shortness of breath post operatively. Cardiology following for acute on chronic diastolic CHF.  Assessment & Plan    1. Acute on chronic diastolic CHF: Has grade 2 diastolic dysfunction with normal LV systolic function Still on torsemide 80 mg BID   Wt = 277  Creatinine = 1.79  (stable )  CXR shows very  low lung volumes.  No CHF, no effusions  He has stable chronic diastolic chf.   No signs of worsening CHF I suspect his hypoxemia while walking and exercising is due to his atelectasis . He is on torsemide 80 PO BID .   Renal function has been stable I/O - 8.5 liters  May need incentive spirometry ,   ? Other respiratory therapy  / PT to expand lungs    2. HTN: -Stable, 137/61>131/53>133/56  -Hydralazine 50mg  PO TID, metoprolol 50mg  PO BID -Continue current regimen    3. Acute on chronic renal insufficiency: -Creatinine, 1.79  -Continues to fluctuate, however downtrending from admission of 2.64 and improved slightly from yesterday   4. Hypokalemia: -Stable, , potassium is 3.7 today today  -On daily supplementation   5. UTI: -Per primary  -Treatment with abx  6. DM2: -SSI -Per primary  7. Hypothyroidism: -On synthroid -Per primary     Mertie Moores, MD  05/06/2018 1:53 PM    [facility]  [facility],  [place], [place]  [address] Pager [phone] Phone: [phone]; Fax: [phone]

## 2018-05-06 NOTE — Progress Notes (Signed)
Pharmacy Antibiotic Note  Travis [name] is a 72 y.o. male admitted s/p recent lumbar surgery on 4/11 developed respiratory failure transferred to CIR on 04/16/2018. Pt with surgical wound drainage and UTI started on keflex on 5/1 which was changed to cefepime last night. Urine culture is positive for PAS and klebsiella ornithinolytica. Both are sensitive to zosyn. Pharmacy is consulted to dose zosyn.  Pt with CKD, Scr 1.79 today, est. ncrcl ~28ml/min stable Afebrile, wbc wnl. I feel a 10 day course should be sufficient.  Plan: Zosyn 3.375g IV Q 8 hrs (4hr infusion)  Stop date entered through 5/12 Pharmacy sign off.   Height: 6\' 1"  (185.4 cm) Weight: 277 lb 12.5 oz (126 kg) IBW/kg (Calculated) : 79.9  Temp (24hrs), Avg:98.3 F (36.8 C), Min:97.9 F (36.6 C), Max:98.7 F (37.1 C)  Recent Labs  Lab 04/30/18 0631  05/02/18 0656 05/03/18 0522 05/04/18 0729 05/05/18 0636 05/06/18 0500  WBC 5.0  --   --   --   --   --  6.1  CREATININE 1.82*   < > 1.84* 1.86* 1.85* 1.69* 1.79*   < > = values in this interval not displayed.    Estimated Creatinine Clearance: 51.1 mL/min (A) (by C-G formula based on SCr of 1.79 mg/dL (H)).    Allergies  Allergen Reactions  . Other Other (See Comments)    ANESTHESIA UNSPECIFIED  -- makes sick, must have antinausea medication in advance.  . Levofloxacin In D5w Itching    Possible infusion reaction 03/29/17. Tolerating oral Levaquin 03/30/17    Antimicrobials this admission: 5/1 Keflex >> 5/2 5/2 Cefepime >> 5/3 5/3 Zosyn >> (5/10)  4/14 Vanc>>4/15 4/14 Zosyn >>4/16 4/16 cefdinir >>4/17  Microbiology results: 4/29 Urine - 100K colonies PSA (S-cefepime, fortaz, imi, zosyn)                      40K colonies klebsiella ornithinolytica (S- cipro, imi, nitro, zosyn)  4/14 blood x 2 >> neg 4/14 urine neg    Thank you for allowing [place] to participate in this patients care.  Maryanna Shape, PharmD, BCPS  Clinical Pharmacist  Pager:  [phone]   05/06/2018 2:04 PM

## 2018-05-06 NOTE — Progress Notes (Signed)
Social Work Patient ID: Travis [name], male   DOB: 1946/02/18, 72 y.o.   MRN: [id number]   Met with pt and wife yesterday afternoon to review team conference.  Explained that team feels strongly that pt needs to stay until targeted d/c date of 5/15 (pt had hoped to leave earlier).  They note that he is making progress and needs this LOS to meet level that wife can manage at home.  Both are in agreement with this and understand that we are making this information known to insurance as well with likely peer to peer with MD.  Wife very concerned about him reaching these goals.  We discussed need for her to begin "hands on" training on Monday to confirm if she can manage care.  Continue to follow.  Puneet Masoner, LCSW

## 2018-05-06 NOTE — Progress Notes (Signed)
Physical Therapy Session Note  Patient Details  Name: Travis [name] MRN: [id number] Date of Birth: 1946/08/29  Today's Date: 05/06/2018 PT Individual Time: 0900-1000 AND 1305-1400 PT Individual Time Calculation (min): 60 min AND 55 min   Short Term Goals: Week 3:  PT Short Term Goal 1 (Week 3): Pt will perform bed mobility with min assist  PT Short Term Goal 2 (Week 3): Pt will performed sit<>stand with min assist  PT Short Term Goal 3 (Week 3): Pt will bed<>WC transfer with min assist  PT Short Term Goal 4 (Week 3): Pt will ambulate 54f with min assist and LRAD without desat <88%    Skilled Therapeutic Interventions/Progress Updates:      Pt received sitting in WC and agreeable to PT  WC mobility x 1555fwith supervision assist from PT and min cues for turning.   [facility] to min assist throughout the treatment. Sit<>stand also completed from mat table with min assist.   Pt reports hip flexor cramp. Standing tolerance in hip flexor stretch x 1 min BLE. reduced pain reported following stretch.   Gait training x 1545fith RW and 2L O2. Pt SpO2 measured at 85% following gait with increased to time to allow return >88%.   Sit<>supine with mod assist to control BLE into semirecumbent position. Min cues for proper use of BUE. Pt reports need for urinal. Continent bladder movement once reclined on mat table.   semirecumbent therex. AAROM SLR, hip abduction, heel slides, x 10 BLE for each exercise. Prolonged rest between exercises due to labour break PNF contract relax to improved Hamstring ROM in BLE L>R.   Patient returned to room and [facility] and all needs met.     Session 2.   Pt received sitting in WC and agreeable to PT  WC mobility x [id number] supervision assist and min cues for pursed lip breathing.   UBE x 2 min with SpO2 check following, measured at 78% with 2 minutes to increase back to 90%.  Reduced resistance to 3.0 from 5.0 and monitored continuously. Able to perform ~1 min at a time prior to SpO2 decreasing to 85% with 1 min to return>90% x 4 bouts. Cardiologist present at end of UBE training and informed of pt presentation with mobility.   Additional WC mobility x [id number] SpO2 reading at 84% and 2 minutes to increase >90%. Once in room PT instructed pt in sit<>stand x 2 with SpO2 measured at 78% with 3 minutes to increase >90%.   Pt maintained on 2L/min O2 via nasual canula throughout treatment with intermittent increase to 3L/min due to significant drop in SpO2 with movement on this day.     Therapy Documentation Precautions:  Precautions Precautions: Fall, Back, Cervical Restrictions Weight Bearing Restrictions: No Pain: denies.   See Function Navigator for Current Functional Status.   Therapy/Group: Individual Therapy  AustiLorie Phenix2019, 10:06 AM

## 2018-05-06 NOTE — Progress Notes (Signed)
Occupational Therapy Session Note  Patient Details  Name: Travis [name] MRN: [id number] Date of Birth: 12/15/1946  Today's Date: 05/06/2018 OT Individual Time: 8280-0349 and 1791-5056 OT Individual Time Calculation (min): 60 min and 30 min   Short Term Goals: Week 3:  OT Short Term Goal 1 (Week 3): Pt will complete bathing with mod assist with use of AE  OT Short Term Goal 2 (Week 3): Pt will complete LB dressing with max assist with use of AE OT Short Term Goal 3 (Week 3): Pt will complete 1/3 toileting steps to decrease burden of care OT Short Term Goal 4 (Week 3): Pt will complete 1 grooming task in standing to increase standing tolerance  Skilled Therapeutic Interventions/Progress Updates:   1) Treatment session with focus on functional mobility, sit <> stand, and LB dressing.  Pt received semi-reclined in bed reporting cramp in LLE.  Engaged in stretching prior to getting [place].  Pt completed bed mobility with elevated HOB and bed rails with supervision.  Pt donned shorts sitting EOB with use of reacher to thread pant legs and donned shirt and LSO while seated EOB.  Attempted sit > stand from EOB with pt unable to complete due to no sturdy surface to push up from/arm rests.  Completed lateral scoot to w/c with min guard.  Pt completed grooming tasks seated at sink without assist.  Sit > stand from w/c with increased time with min assist with use of arm rests.  Min cues for sequencing and foot placement to increase independence with sit > stand.  Pt left upright in w/c with legs elevated on chair for edema management.    Pt O2 stats dropped to 80% on 2L with self-care tasks, increased to 3L with pt still dropping to low 80s but able to increase oxygen to 90% with pursed lip breathing.    2) Treatment session with focus on LB self-care tasks.  Pt reports spilling urinal and wetting pants, therefore engaged in LB bathing/dressing at sit > stand level.  Pt completed bed mobility with bed rails  and HOB elevated with supervision.  Sit > stand from EOB with mod assist and completed stand pivot transfer to w/c with min assist and RW.  Engaged in LB bathing and changing pants with min assist for thoroughness and setup for pt to don pants with use of reacher.  Therapist providing min guard for standing balance while pt pulled pants over hips.  Left upright in w/c with elevating leg rests and all needs in reach.  O2 sats dropped to 80% on 2L supplemental O2 after standing for LB dressing.  Returned to 90 with increased time and cues for breathing.  Therapy Documentation Precautions:  Precautions Precautions: Fall, Back, Cervical Restrictions Weight Bearing Restrictions: No General:   Vital Signs: Therapy Vitals Temp: 98.7 F (37.1 C) Temp Source: Oral Pulse Rate: (!) 59 Resp: 19 BP: (!) 134/59 Patient Position (if appropriate): Lying Oxygen Therapy SpO2: 92 % O2 Flow Rate (L/min): 3 L/min Pain:  Pt with no c/o pain  See Function Navigator for Current Functional Status.   Therapy/Group: Individual Therapy  [name] 05/06/2018, 8:42 AM

## 2018-05-06 NOTE — Plan of Care (Signed)
  Problem: SCI BLADDER ELIMINATION Goal: RH STG MANAGE BLADDER WITH ASSISTANCE Description STG Manage Bladder With Assistance. Mod  Outcome: Progressing   Problem: RH SKIN INTEGRITY Goal: RH STG SKIN FREE OF INFECTION/BREAKDOWN Description Remain free of breakdown with mod assist  Outcome: Progressing Goal: RH STG MAINTAIN SKIN INTEGRITY WITH ASSISTANCE Description STG Maintain Skin Integrity With Assistance. Mod  Outcome: Progressing   Problem: SCI BOWEL ELIMINATION Goal: RH STG MANAGE BOWEL WITH ASSISTANCE Description STG Manage Bowel with mod assistance.  Outcome: Progressing Goal: RH STG SCI MANAGE BOWEL WITH MEDICATION WITH ASSISTANCE Description STG SCI Manage bowel with medication with assistance. Outcome: Progressing   Problem: RH SAFETY Goal: RH STG ADHERE TO SAFETY PRECAUTIONS W/ASSISTANCE/DEVICE Description STG Adhere to Safety Precautions With mod Assistance/Device.  Outcome: Progressing   Problem: RH KNOWLEDGE DEFICIT SCI Goal: RH STG INCREASE KNOWLEDGE OF SELF CARE AFTER SCI Description Increase knowledge of self care after SCI with mod assist  Outcome: Progressing   Problem: Consults Goal: RH SPINAL CORD INJURY PATIENT EDUCATION Description  See Patient Education module for education specifics.  Outcome: Progressing

## 2018-05-07 ENCOUNTER — Inpatient Hospital Stay (HOSPITAL_COMMUNITY): Payer: Medicare Other | Admitting: Physical Therapy

## 2018-05-07 ENCOUNTER — Inpatient Hospital Stay (HOSPITAL_COMMUNITY): Payer: Medicare Other | Admitting: Occupational Therapy

## 2018-05-07 DIAGNOSIS — J9811 Atelectasis: Secondary | ICD-10-CM

## 2018-05-07 DIAGNOSIS — Z9981 Dependence on supplemental oxygen: Secondary | ICD-10-CM

## 2018-05-07 LAB — BASIC METABOLIC PANEL
Anion gap: 11 (ref 5–15)
BUN: 34 mg/dL — AB (ref 6–20)
CO2: 38 mmol/L — ABNORMAL HIGH (ref 22–32)
CREATININE: 1.71 mg/dL — AB (ref 0.61–1.24)
Calcium: 8.8 mg/dL — ABNORMAL LOW (ref 8.9–10.3)
Chloride: 96 mmol/L — ABNORMAL LOW (ref 101–111)
GFR calc Af Amer: 44 mL/min — ABNORMAL LOW (ref >60)
GFR, EST NON AFRICAN AMERICAN: 38 mL/min — AB (ref >60)
Glucose, Bld: 125 mg/dL — ABNORMAL HIGH (ref 65–99)
Potassium: 3.7 mmol/L (ref 3.5–5.1)
SODIUM: 145 mmol/L (ref 135–145)

## 2018-05-07 LAB — CBC WITH DIFFERENTIAL/PLATELET
BASOS ABS: 0 10*3/uL (ref 0.0–0.1)
Basophils Relative: 0 %
EOS PCT: 3 %
Eosinophils Absolute: 0.2 10*3/uL (ref 0.0–0.7)
HCT: 31.7 % — ABNORMAL LOW (ref 39.0–52.0)
HEMOGLOBIN: 9.4 g/dL — AB (ref 13.0–17.0)
LYMPHS ABS: 1 10*3/uL (ref 0.7–4.0)
Lymphocytes Relative: 18 %
MCH: 28.5 pg (ref 26.0–34.0)
MCHC: 29.7 g/dL — ABNORMAL LOW (ref 30.0–36.0)
MCV: 96.1 fL (ref 78.0–100.0)
Monocytes Absolute: 0.4 10*3/uL (ref 0.1–1.0)
Monocytes Relative: 8 %
Neutro Abs: 3.7 10*3/uL (ref 1.7–7.7)
Neutrophils Relative %: 71 %
PLATELETS: 134 10*3/uL — AB (ref 150–400)
RBC: 3.3 MIL/uL — AB (ref 4.22–5.81)
RDW: 16.4 % — ABNORMAL HIGH (ref 11.5–15.5)
WBC: 5.3 10*3/uL (ref 4.0–10.5)

## 2018-05-07 LAB — GLUCOSE, CAPILLARY
GLUCOSE-CAPILLARY: 162 mg/dL — AB (ref 65–99)
GLUCOSE-CAPILLARY: 138 mg/dL — AB (ref 65–99)
Glucose-Capillary: 112 mg/dL — ABNORMAL HIGH (ref 65–99)
Glucose-Capillary: 244 mg/dL — ABNORMAL HIGH (ref 65–99)

## 2018-05-07 NOTE — Progress Notes (Signed)
Occupational Therapy Weekly Progress Note  Patient Details  Name: Travis [name] MRN: [id number] Date of Birth: 10/01/1946  Beginning of progress report period: Apr 30, 2018 End of progress report period: May 07, 2018  Today's Date: 05/07/2018 OT Individual Time: 0730-0830 OT Individual Time Calculation (min): 60 min    Patient has met 3 of 4 short term goals.  Pt is making steady progress towards goals.  Pt is able to complete sit > stand with min-mod assist dependent on height of surface and UE support.  Pt has demonstrated ability to don pants with use of AE with setup assist and pull pants over hips with min guard while in standing, alternating UE support.  Pt continues to require assistance for hygiene post BM due to back precautions and body habitus.  Pt min assist - min guard with short distance ambulation and transfers, once upright, with RW.  Pt progress has been limited due to decreased oxygen support and endurance, requiring increased supplemental O2 due to sats dropping to low 80s with any activity.    Patient continues to demonstrate the following deficits: muscle weakness and muscle joint tightness,decreased cardiorespiratoy endurance and decreased oxygen support, anddecreased standing balance, decreased balance strategies and difficulty maintaining precautions and therefore will continue to benefit from skilled OT intervention to enhance overall performance with BADL and Reduce care partner burden.  Patient progressing toward long term goals..  Continue plan of care.  OT Short Term Goals Week 3:  OT Short Term Goal 1 (Week 3): Pt will complete bathing with mod assist with use of AE  OT Short Term Goal 1 - Progress (Week 3): Met OT Short Term Goal 2 (Week 3): Pt will complete LB dressing with max assist with use of AE OT Short Term Goal 2 - Progress (Week 3): Met OT Short Term Goal 3 (Week 3): Pt will complete 1/3 toileting steps to decrease burden of care OT Short Term Goal  3 - Progress (Week 3): Met OT Short Term Goal 4 (Week 3): Pt will complete 1 grooming task in standing to increase standing tolerance OT Short Term Goal 4 - Progress (Week 3): Progressing toward goal Week 4:  OT Short Term Goal 1 (Week 4): STG = LTGs due to remaining LOS  Skilled Therapeutic Interventions/Progress Updates:    Treatment session with focus on sit <> stand, standing tolerance, and LB dressing with toileting tasks.  Pt received seated EOB reporting need and desire to work on breathing with incentive spirometer.  Educated on purpose of incentive spirometer and impact of decreased breaths with mobility and endurance.  Engaged in sit > stand from EOB with mod assist requiring increased time to allow pt to lift up, however ultimately requiring lifting assistance due to decreased UE support from EOB.  Pt reports need to toilet.  Ambulated to [facility] with RW and min assist.  Pt required assistance for hygiene post BM due to back precautions and body habitus.  Min assist sit > stand from [facility] with increased time and cues for foot placement.  Pt able to pull shorts over hips while alternating UE support on RW.  Returned to EOB and returned to utilizing incentive spirometer.  Therapy Documentation Precautions:  Precautions Precautions: Fall, Back, Cervical Restrictions Weight Bearing Restrictions: No General:   Vital Signs: Therapy Vitals Temp: (!) 97.5 F (36.4 C) Temp Source: Oral Pulse Rate: (!) 59 Resp: 20 BP: (!) 146/61 Oxygen Therapy SpO2: 90 % O2 Device: Nasal Cannula O2 Flow Rate (L/min):  2 L/min Pain:  Pt with no c/o pain  See Function Navigator for Current Functional Status.   Therapy/Group: Individual Therapy  [name] 05/07/2018, 8:11 AM

## 2018-05-07 NOTE — Progress Notes (Signed)
Pt has home unit.  Rt at bed side added water and bleed in 4L of 02 though.  Pt SATS are 96% and pt sates he is comfortable.

## 2018-05-07 NOTE — Progress Notes (Signed)
Subjective/Complaints: Patient seen itting up in bed this morning.He states he slept well overnight. Reviewed cardiology recommendations with patient.  ROS: +SOB. denies CP, N/V/D.   Objective: Vital Signs: Blood pressure (!) 146/61, pulse (!) 59, temperature (!) 97.5 F (36.4 C), temperature source Oral, resp. rate 20, height 6' 1"  (1.854 m), weight 127 kg (279 lb 15.8 oz), SpO2 90 %. Dg Chest 2 View  Result Date: 05/06/2018 CLINICAL DATA:  Postoperative evaluation for lumbar surgery. EXAM: CHEST - 2 VIEW COMPARISON:  Chest radiograph 04/16/2018 FINDINGS: Cervical and midthoracic spinal fusion hardware. Stable cardiomegaly. Low lung volumes. Bibasilar atelectasis. No large area pulmonary consolidation. No pleural effusion or pneumothorax. Healing left rib fracture. IMPRESSION: Low lung volumes with basilar atelectasis. Electronically Signed   By: Lovey Newcomer M.D.   On: 05/06/2018 11:02   Results for orders placed or performed during the hospital encounter of 04/16/18 (from the past 72 hour(s))  Glucose, capillary     Status: Abnormal   Collection Time: 05/04/18 12:04 PM  Result Value Ref Range   Glucose-Capillary 128 (H) 65 - 99 mg/dL  Glucose, capillary     Status: Abnormal   Collection Time: 05/04/18  4:48 PM  Result Value Ref Range   Glucose-Capillary 195 (H) 65 - 99 mg/dL  Glucose, capillary     Status: Abnormal   Collection Time: 05/04/18 10:59 PM  Result Value Ref Range   Glucose-Capillary 128 (H) 65 - 99 mg/dL  Basic metabolic panel     Status: Abnormal   Collection Time: 05/05/18  6:36 AM  Result Value Ref Range   Sodium 145 135 - 145 mmol/L   Potassium 3.7 3.5 - 5.1 mmol/L   Chloride 99 (L) 101 - 111 mmol/L   CO2 39 (H) 22 - 32 mmol/L   Glucose, Bld 117 (H) 65 - 99 mg/dL   BUN 32 (H) 6 - 20 mg/dL   Creatinine, Ser 1.69 (H) 0.61 - 1.24 mg/dL   Calcium 8.6 (L) 8.9 - 10.3 mg/dL   GFR calc non Af Amer 38 (L) >60 mL/min   GFR calc Af Amer 45 (L) >60 mL/min    Comment:  (NOTE) The eGFR has been calculated using the CKD EPI equation. This calculation has not been validated in all clinical situations. eGFR's persistently <60 mL/min signify possible Chronic Kidney Disease.    Anion gap 7 5 - 15    Comment: Performed at [facility] [address], [place], [place] [address]  Glucose, capillary     Status: Abnormal   Collection Time: 05/05/18  7:18 AM  Result Value Ref Range   Glucose-Capillary 118 (H) 65 - 99 mg/dL  Glucose, capillary     Status: Abnormal   Collection Time: 05/05/18 11:26 AM  Result Value Ref Range   Glucose-Capillary 164 (H) 65 - 99 mg/dL  Glucose, capillary     Status: Abnormal   Collection Time: 05/05/18  5:16 PM  Result Value Ref Range   Glucose-Capillary 195 (H) 65 - 99 mg/dL  Glucose, capillary     Status: Abnormal   Collection Time: 05/05/18  9:15 PM  Result Value Ref Range   Glucose-Capillary 140 (H) 65 - 99 mg/dL   Comment 1 Notify RN   Basic metabolic panel     Status: Abnormal   Collection Time: 05/06/18  5:00 AM  Result Value Ref Range   Sodium 145 135 - 145 mmol/L   Potassium 3.7 3.5 - 5.1 mmol/L   Chloride 98 (L)  101 - 111 mmol/L   CO2 39 (H) 22 - 32 mmol/L   Glucose, Bld 142 (H) 65 - 99 mg/dL   BUN 34 (H) 6 - 20 mg/dL   Creatinine, Ser 1.79 (H) 0.61 - 1.24 mg/dL   Calcium 8.7 (L) 8.9 - 10.3 mg/dL   GFR calc non Af Amer 36 (L) >60 mL/min   GFR calc Af Amer 42 (L) >60 mL/min    Comment: (NOTE) The eGFR has been calculated using the CKD EPI equation. This calculation has not been validated in all clinical situations. eGFR's persistently <60 mL/min signify possible Chronic Kidney Disease.    Anion gap 8 5 - 15    Comment: Performed at [facility] [address], [place], [place] [address]  CBC with Differential/Platelet     Status: Abnormal   Collection Time: 05/06/18  5:00 AM  Result Value Ref Range   WBC 6.1 4.0 - 10.5 K/uL   RBC 3.28 (L) 4.22 - 5.81 MIL/uL   Hemoglobin 9.3 (L) 13.0 - 17.0  g/dL   HCT 31.4 (L) 39.0 - 52.0 %   MCV 95.7 78.0 - 100.0 fL   MCH 28.4 26.0 - 34.0 pg   MCHC 29.6 (L) 30.0 - 36.0 g/dL   RDW 16.3 (H) 11.5 - 15.5 %   Platelets 140 (L) 150 - 400 K/uL   Neutrophils Relative % 74 %   Neutro Abs 4.6 1.7 - 7.7 K/uL   Lymphocytes Relative 15 %   Lymphs Abs 0.9 0.7 - 4.0 K/uL   Monocytes Relative 9 %   Monocytes Absolute 0.5 0.1 - 1.0 K/uL   Eosinophils Relative 2 %   Eosinophils Absolute 0.2 0.0 - 0.7 K/uL   Basophils Relative 0 %   Basophils Absolute 0.0 0.0 - 0.1 K/uL    Comment: Performed at [facility], [address], [place], [place] [address]  Glucose, capillary     Status: Abnormal   Collection Time: 05/06/18  6:28 AM  Result Value Ref Range   Glucose-Capillary 139 (H) 65 - 99 mg/dL   Comment 1 Notify RN   Glucose, capillary     Status: Abnormal   Collection Time: 05/06/18 11:40 AM  Result Value Ref Range   Glucose-Capillary 116 (H) 65 - 99 mg/dL  Glucose, capillary     Status: Abnormal   Collection Time: 05/06/18  4:54 PM  Result Value Ref Range   Glucose-Capillary 213 (H) 65 - 99 mg/dL   Comment 1 Notify RN   Glucose, capillary     Status: Abnormal   Collection Time: 05/06/18  9:15 PM  Result Value Ref Range   Glucose-Capillary 136 (H) 65 - 99 mg/dL   Comment 1 Notify RN   Basic metabolic panel     Status: Abnormal   Collection Time: 05/07/18  5:41 AM  Result Value Ref Range   Sodium 145 135 - 145 mmol/L   Potassium 3.7 3.5 - 5.1 mmol/L   Chloride 96 (L) 101 - 111 mmol/L   CO2 38 (H) 22 - 32 mmol/L   Glucose, Bld 125 (H) 65 - 99 mg/dL   BUN 34 (H) 6 - 20 mg/dL   Creatinine, Ser 1.71 (H) 0.61 - 1.24 mg/dL   Calcium 8.8 (L) 8.9 - 10.3 mg/dL   GFR calc non Af Amer 38 (L) >60 mL/min   GFR calc Af Amer 44 (L) >60 mL/min    Comment: (NOTE) The eGFR has been calculated using the CKD  EPI equation. This calculation has not been validated in all clinical situations. eGFR's persistently <60 mL/min signify possible Chronic  Kidney Disease.    Anion gap 11 5 - 15    Comment: Performed at [facility] [address], [place], [place] [address]  CBC with Differential/Platelet     Status: Abnormal   Collection Time: 05/07/18  5:41 AM  Result Value Ref Range   WBC 5.3 4.0 - 10.5 K/uL   RBC 3.30 (L) 4.22 - 5.81 MIL/uL   Hemoglobin 9.4 (L) 13.0 - 17.0 g/dL   HCT 31.7 (L) 39.0 - 52.0 %   MCV 96.1 78.0 - 100.0 fL   MCH 28.5 26.0 - 34.0 pg   MCHC 29.7 (L) 30.0 - 36.0 g/dL   RDW 16.4 (H) 11.5 - 15.5 %   Platelets 134 (L) 150 - 400 K/uL   Neutrophils Relative % 71 %   Neutro Abs 3.7 1.7 - 7.7 K/uL   Lymphocytes Relative 18 %   Lymphs Abs 1.0 0.7 - 4.0 K/uL   Monocytes Relative 8 %   Monocytes Absolute 0.4 0.1 - 1.0 K/uL   Eosinophils Relative 3 %   Eosinophils Absolute 0.2 0.0 - 0.7 K/uL   Basophils Relative 0 %   Basophils Absolute 0.0 0.0 - 0.1 K/uL    Comment: Performed at [facility], [address], [place], [place] [address]  Glucose, capillary     Status: Abnormal   Collection Time: 05/07/18  7:07 AM  Result Value Ref Range   Glucose-Capillary 112 (H) 65 - 99 mg/dL    Constitutional: No distress . Vital signs reviewed. HENT: Normocephalic.  Atraumatic. Eyes: EOMI. No discharge. Cardiovascular: RRR. No JVD. Respiratory: CTA Bilaterally. Increased WOB, slightly improved. +[place]. GI: BS +. Non-distended. Musc/Skel:  LE edema. No tenderness. Neuro: Alert and Oriented  Motor: 5/5 BUE proximal to distal BLE 3/5 HF, 4/5 distally  (stable) Skin: Unna boots to bilateral lower extremities Surgical incision with dressing C/D/I  Assessment/Plan: 1. 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 Physiatrist is providing close team supervision and 24 hour management of active medical problems listed below. Physiatrist and rehab team continue to assess barriers to discharge/monitor patient  progress toward functional and medical goals. FIM: Function - Bathing Position: (seated on toilet) Body parts bathed by patient: Right arm, Left arm, Chest, Abdomen, Front perineal area, Right upper leg, Left upper leg Body parts bathed by helper: Buttocks, Back Bathing not applicable: Left lower leg, Right lower leg(Unna boots) Assist Level: Touching or steadying assistance(Pt > 75%)  Function- Upper Body Dressing/Undressing What is the patient wearing?: Pull over shirt/dress Pull over shirt/dress - Perfomed by patient: Thread/unthread right sleeve, Thread/unthread left sleeve, Put head through opening, Pull shirt over trunk Assist Level: Set up Function - Lower Body Dressing/Undressing What is the patient wearing?: Pants Position: Wheelchair/chair at sink Pants- Performed by patient: Thread/unthread right pants leg, Thread/unthread left pants leg, Pull pants up/down Pants- Performed by helper: Pull pants up/down Non-skid slipper socks- Performed by helper: Don/doff right sock, Don/doff left sock TED Hose - Performed by helper: Don/doff right TED hose, Don/doff left TED hose Assist for footwear: Maximal assist Assist for lower body dressing: Assistive device, Touching or steadying assistance (Pt > 75%) Assistive Device Comment: reacher  Function - Toileting Toileting activity did not occur: No continent bowel/bladder event Toileting steps completed by patient: Adjust clothing prior to toileting,  Performs perineal hygiene, Adjust clothing after toileting Toileting steps completed by helper: Adjust clothing prior to toileting, Performs perineal hygiene, Adjust clothing after toileting Toileting Assistive Devices: Grab bar or rail Assist level: Two helpers  Function - Air cabin crew transfer activity did not occur: Safety/medical concerns Toilet transfer assistive device: Bedside commode, Environmental manager lift: Stedy Assist level to toilet: Maximal assist (Pt 25 - 49%/lift  and lower) Assist level from toilet: Maximal assist (Pt 25 - 49%/lift and lower) Assist level to bedside commode (at bedside): Moderate assist (Pt 50 - 74%/lift or lower) Assist level from bedside commode (at bedside): 2 helpers  Function - Chair/bed transfer Chair/bed transfer method: Stand pivot Chair/bed transfer assist level: Touching or steadying assistance (Pt > 75%) Chair/bed transfer assistive device: Walker Mechanical lift: Stedy Chair/bed transfer details: Verbal cues for safe use of DME/AE, Manual facilitation for placement, Manual facilitation for weight shifting, Verbal cues for precautions/safety  Function - Locomotion: Wheelchair Will patient use wheelchair at discharge?: Yes Type: Manual Max wheelchair distance: 181f Assist Level: Supervision or verbal cues Wheel 50 feet with 2 turns activity did not occur: Safety/medical concerns Assist Level: Supervision or verbal cues Wheel 150 feet activity did not occur: Safety/medical concerns Assist Level: Supervision or verbal cues Turns around,maneuvers to table,bed, and toilet,negotiates 3% grade,maneuvers on rugs and over doorsills: No Function - Locomotion: Ambulation Ambulation activity did not occur: [name] Assistive device: Walker-rolling Max distance: 626fAssist level: Supervision or verbal cues Assist level: Supervision or verbal cues Assist level: Touching or steadying assistance (Pt > 75%) Walk 150 feet activity did not occur: Safety/medical concerns  Function - Comprehension Comprehension: Auditory Comprehension assist level: Follows basic conversation/direction with extra time/assistive device  Function - Expression Expression: Verbal Expression assist level: Expresses complex 90% of the time/cues < 10% of the time  Function - Social Interaction Social Interaction assist level: Interacts appropriately 90% of the time - Needs monitoring or encouragement for participation or interaction.  Function - Problem  Solving Problem solving assist level: Solves basic problems with no assist  Function - Memory Memory assist level: Recognizes or recalls 90% of the time/requires cueing < 10% of the time Patient normally able to recall (first 3 days only): Current season, Location of own room, Staff names and faces, That he or she is in a hospital  Medical Problem List and Plan: 1.  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  DVT Prophylaxis/Anticoagulation: SCDs.      Vascular study negative but unna boots limited study 3. Pain Management: Neurontin 400 mg 3 times daily 4. Mood: Lexapro 10 mg daily 5. Neuropsych: This patient is capable of making decisions on his own behalf. 6. Skin/Wound Care: Pressure relief measures.    Keflex started on 5/1 for surgical wound drainage, changed to Zosyn on 5/4-5/10 7. Fluids/Electrolytes/Nutrition: Strict I/Os. Heart healthy diet. Monitor voiding function.  8. OSA with acute hypercarbic respiratory failure: Has to use BIPAP or CPAP at nights  Weaning supplemental oxygen as tolerated  CXR reviewed, suggesting atelectasis  Educated and encouraged use of IS 9. Acute on chronic diastolic CHF:  Improved with lasix which has been discontinued and [facility].  Hydralazine dose adjusted. Monitor weights daily with strict I/Os.       Filed Weights   05/06/18 0535 05/06/18 2201 05/07/18 0710  Weight: 126 kg (277 lb 12.5 oz) 126.2 kg (278 lb 3.5 oz)  127 kg (279 lb 15.8 oz)    Stable on 5/9  Managed by cards, appreciate recs 10 Acute on chronic renal failure.   Hydralazine 50 mg 3 times daily   Hold ARB per Cards   Cr 1.71 on 5/10  Encourage appropriate fluid intake 11. Morbid obesity: BMI 40.46.  12. Hypoxic encephalopathy:   Resolved    limit sedating medications. Encourage IS. CPAP whenever asleep. 13.  Hypertension.  Lopressor  25 mg twice daily, increased to 50 BID on 5/3.    ? Trending up on 5/10  Monitor with increased mobility 14.  BPH.  Flomax 0.4 mg daily, Toviaz 8 mg daily.   15.  Diabetes mellitus with peripheral neuropathy.  Hemoglobin A1c 7.7.  SSI.  Check blood sugars before meals and at bedtime   On amaryl at home, resumed. Titrate regimen as needed   Labile and 5/10 16.  Fevers.  Resolved. Blood cultures no growth.  Urine culture negative. Completed course of Omnicef 17.  Hypothyroidism.  Synthroid 18.  Constipation.  Laxative assistance, increased Senokot to 2 tablets twice a day, continue MiraLAX 1 packet/day   Dulcolax prn 19. Hypokalemia:   Potassium 3.7 on 5/10   Continue supplementation   Cont to monitor 20. Acute blood loss anemia  Hemoglobin 9.4 on 5/10  Continue to monitor 21. Acute lower UTI  UA+, Ucx with greater than 100,000 Pseudomonas and greater than 40,000 Klebsiella, sensitivities reviewed  Keflex changed to cefepime , Changed to Zosyn on 5/3 due to multidrug resistances 22. Thrombocytopenia  Platelets 134 on 5/10  Labs ordered for tomorrow, will discuss with pharmacy, ? Due to Zosyn - last dose today  LOS (Days) 21 A FACE TO FACE EVALUATION WAS PERFORMED  Obed Samek Lorie Phenix 05/07/2018, 8:18 AM

## 2018-05-07 NOTE — Progress Notes (Addendum)
Physical Therapy Weekly Progress Note  Patient Details  Name: Travis [name] MRN: [id number] Date of Birth: [birth year]  Beginning of progress report period: Apr 30, 2018 End of progress report period: May 07, 2018  Today's Date: 05/07/2018 PT Individual Time: 0920-1005 AND 1105-1205 AND 1520-1545 PT Individual Time Calculation (min): 45 min AND 60 min NAD 25 min   Patient has met 2 of 4 short term goals.  Pt continues to make slow progress towards goals with increased difficulty sustaining O2 saturation this week. Pt has progressed to min-supervision assist transfers with SB to and from [facility] and min assist transfers from elevated surfaces, but continues to require mod assist from a standard seat height. Pt ambe to ambulate up to 45 ft with min-supervision assist and only slight desat <88%.   Patient continues to demonstrate the following deficits muscle weakness and muscle joint tightness, decreased cardiorespiratoy endurance and decreased oxygen support and decreased standing balance and decreased balance strategies and therefore will continue to benefit from skilled PT intervention to increase functional independence with mobility.  Patient progressing toward long term goals..  Continue plan of care.  PT Short Term Goals Week 3:  PT Short Term Goal 1 (Week 3): Pt will perform bed mobility with min assist  PT Short Term Goal 1 - Progress (Week 3): Partly met PT Short Term Goal 2 (Week 3): Pt will performed sit<>stand with min assist  PT Short Term Goal 2 - Progress (Week 3): Met PT Short Term Goal 3 (Week 3): Pt will bed<>WC transfer with min assist  PT Short Term Goal 3 - Progress (Week 3): Met PT Short Term Goal 4 (Week 3): Pt will ambulate 87f with min assist and LRAD without desat <88%  PT Short Term Goal 4 - Progress (Week 3): Partly met Week 4:  PT Short Term Goal 1 (Week 4): STG=LTG due to ELOS   Skilled Therapeutic Interventions/Progress Updates:   Session 1  Pt received  sitting EOB and agreeable to PT. Sit>stand from bed with mod assist gait [facility]  WC mobility [id number] assist from PT to im[prove breathing technique  Stand pivot transfer to nustep with mod assist to power to standing with RW.   Nustep endurance training 3 min with BUE and BLE and 3 min with BLE only. continuos Pulse Oximetry. Pt required to stop 4 times with BUE and BLE to allow SpO2 to increase to >88% and twice with BLE. With each rest break, at least 30 sec required to improve to >88%.   Lateral [facility] and min cues for safety and UE placement.   [facility] returned to room and [facility] reach and all needs met.      Session 2.   Pt received sitting in WC and agreeable to PT  WC mobility x [id number] supervision assist from PT min cues for WC [facility] in tight space of room.   SB transfer to Mat table with set up and supervision assist from PT. Min cues for proper UE placement and improved anterior weight shifting.   Sit<>stand from elevated mat table x 5 with min assist and min cues for use of UE to push from table and improve success.   Standing Therex mini squats, hip abduction, marches. X 5 BLE each exercise. Prolonged rest breaks between exercises due to increase fatiuge.   Gait training with RW x [id number] RW and min-supervision assist  from PT. Pt noted to desat to 85% upon completion of gait training 30sec to increase to >88%  Patient returned to room and left sitting in [facility] with call bell in reach and all needs met.      Session 3.   Pt received sitting in WC and agreeable to PT  Pt instructed in seated BLE therex.  LAQ 2x 10 BLE  Calf raises 2 x 15.  Hip abduction with manual resistance 2 x 10  Hip extension from flexed position with manual resistance 2 x 8  Reciprocal marches 2 x 8  Moderate cues from PT throughout therex for decreased speed with eccentric movement to improve strengthening  aspect of movements.   Patient left sitting in [facility] with call bell in reach and all needs met.      Pt on 2L/min O2 via nasual canula throughout all treatments.       Therapy Documentation Precautions:  Precautions Precautions: Fall, Back, Cervical Restrictions Weight Bearing Restrictions: No    Vital Signs: Therapy Vitals Temp: (!) 97.5 F (36.4 C) Temp Source: Oral Pulse Rate: (!) 59 Resp: 20 BP: (!) 146/61 Oxygen Therapy SpO2: 90 % O2 Device: Nasal Cannula O2 Flow Rate (L/min): 2 L/min Pain:  0/10   See Function Navigator for Current Functional Status.  Therapy/Group: Individual Therapy  Lorie Phenix 05/07/2018, 10:08 AM

## 2018-05-07 NOTE — Progress Notes (Signed)
Orthopedic Tech Progress Note Patient Details:  Travis [name] 14-Sep-1946 [id number]  Patient ID: Travis [name], male   DOB: 1946-07-01, 72 y.o.   MRN: [id number]   [name] out of unna boot wrap. 05/07/2018, 9:52 AM

## 2018-05-07 NOTE — Progress Notes (Signed)
Discussed Unna boot dressing change with ortho and per ortho, no unna boots are in hospital system at this time. They have been ordered and waiting for arrival. Pt is on the list for ortho to change unna boots when available. Ortho aware of need for change.

## 2018-05-08 ENCOUNTER — Inpatient Hospital Stay (HOSPITAL_COMMUNITY): Payer: Medicare Other | Admitting: Occupational Therapy

## 2018-05-08 LAB — GLUCOSE, CAPILLARY
GLUCOSE-CAPILLARY: 131 mg/dL — AB (ref 65–99)
GLUCOSE-CAPILLARY: 127 mg/dL — AB (ref 65–99)
Glucose-Capillary: 163 mg/dL — ABNORMAL HIGH (ref 65–99)
Glucose-Capillary: 180 mg/dL — ABNORMAL HIGH (ref 65–99)

## 2018-05-08 MED ORDER — GLIMEPIRIDE 1 MG PO TABS
2.0000 mg | ORAL_TABLET | Freq: Every day | ORAL | Status: DC
  Administered 2018-05-08 – 2018-05-12 (×5): 2 mg via ORAL
  Filled 2018-05-08 (×5): qty 2

## 2018-05-08 NOTE — Progress Notes (Signed)
Occupational Therapy Session Note  Patient Details  Name: Travis [name] MRN: [id number] Date of Birth: 26-Sep-1946  Today's Date: 05/08/2018 OT Group Time:  - 60 minutes missed    Skilled Therapeutic Interventions/Progress Updates:    Pt seen for scheduled therapeutic dance group and declined participation this AM due to fatigue. 60 minutes missed.   Therapy Documentation Precautions:  Precautions Precautions: Fall, Back, Cervical Restrictions Weight Bearing Restrictions: No  See Function Navigator for Current Functional Status.   Therapy/Group: Group Therapy  Travis [name] 05/08/2018, 12:53 PM

## 2018-05-08 NOTE — Progress Notes (Signed)
Patient refused HS snack

## 2018-05-08 NOTE — Progress Notes (Signed)
RT assisted pt with home CPAP.  Bleed 5L of 02.

## 2018-05-08 NOTE — Progress Notes (Signed)
Subjective/Complaints: Patient had a good nights.  States that his breathing is improving.  Feels that he is getting stronger  ROS: Patient denies fever, rash, sore throat, blurred vision, nausea, vomiting, diarrhea, cough,   chest pain, joint or back pain, headache, or mood change.   Objective: Vital Signs: Blood pressure (!) 142/64, pulse 62, temperature 98.7 F (37.1 C), temperature source Oral, resp. rate 18, height _0  (1.854 m), weight 126.1 kg (278 lb), SpO2 92 %. Dg Chest 2 View  Result Date: 05/06/2018 CLINICAL DATA:  Postoperative evaluation for lumbar surgery. EXAM: CHEST - 2 VIEW COMPARISON:  Chest radiograph 04/16/2018 FINDINGS: Cervical and midthoracic spinal fusion hardware. Stable cardiomegaly. Low lung volumes. Bibasilar atelectasis. No large area pulmonary consolidation. No pleural effusion or pneumothorax. Healing left rib fracture. IMPRESSION: Low lung volumes with basilar atelectasis. Electronically Signed   By: Lovey Newcomer M.D.   On: 05/06/2018 11:02   Results for orders placed or performed during the hospital encounter of 04/16/18 (from the past 72 hour(s))  Glucose, capillary     Status: Abnormal   Collection Time: 05/05/18 11:26 AM  Result Value Ref Range   Glucose-Capillary 164 (H) 65 - 99 mg/dL  Glucose, capillary     Status: Abnormal   Collection Time: 05/05/18  5:16 PM  Result Value Ref Range   Glucose-Capillary 195 (H) 65 - 99 mg/dL  Glucose, capillary     Status: Abnormal   Collection Time: 05/05/18  9:15 PM  Result Value Ref Range   Glucose-Capillary 140 (H) 65 - 99 mg/dL   Comment 1 Notify RN   Basic metabolic panel     Status: Abnormal   Collection Time: 05/06/18  5:00 AM  Result Value Ref Range   Sodium 145 135 - 145 mmol/L   Potassium 3.7 3.5 - 5.1 mmol/L   Chloride 98 (L) 101 - 111 mmol/L   CO2 39 (H) 22 - 32 mmol/L   Glucose, Bld 142 (H) 65 - 99 mg/dL   BUN 34 (H) 6 - 20 mg/dL   Creatinine, Ser 1.79 (H) 0.61 - 1.24 mg/dL   Calcium 8.7 (L)  8.9 - 10.3 mg/dL   GFR calc non Af Amer 36 (L) >60 mL/min   GFR calc Af Amer 42 (L) >60 mL/min    Comment: (NOTE) The eGFR has been calculated using the CKD EPI equation. This calculation has not been validated in all clinical situations. eGFR's persistently <60 mL/min signify possible Chronic Kidney Disease.    Anion gap 8 5 - 15    Comment: Performed at [facility] [address] [place], [place] [address]  CBC with Differential/Platelet     Status: Abnormal   Collection Time: 05/06/18  5:00 AM  Result Value Ref Range   WBC 6.1 4.0 - 10.5 K/uL   RBC 3.28 (L) 4.22 - 5.81 MIL/uL   Hemoglobin 9.3 (L) 13.0 - 17.0 g/dL   HCT 31.4 (L) 39.0 - 52.0 %   MCV 95.7 78.0 - 100.0 fL   MCH 28.4 26.0 - 34.0 pg   MCHC 29.6 (L) 30.0 - 36.0 g/dL   RDW 16.3 (H) 11.5 - 15.5 %   Platelets 140 (L) 150 - 400 K/uL   Neutrophils Relative % 74 %   Neutro Abs 4.6 1.7 - 7.7 K/uL   Lymphocytes Relative 15 %   Lymphs Abs 0.9 0.7 - 4.0 K/uL   Monocytes Relative 9 %   Monocytes Absolute 0.5 0.1 - 1.0 K/uL  Eosinophils Relative 2 %   Eosinophils Absolute 0.2 0.0 - 0.7 K/uL   Basophils Relative 0 %   Basophils Absolute 0.0 0.0 - 0.1 K/uL    Comment: Performed at [facility] [address], [place], [place] [address]  Glucose, capillary     Status: Abnormal   Collection Time: 05/06/18  6:28 AM  Result Value Ref Range   Glucose-Capillary 139 (H) 65 - 99 mg/dL   Comment 1 Notify RN   Glucose, capillary     Status: Abnormal   Collection Time: 05/06/18 11:40 AM  Result Value Ref Range   Glucose-Capillary 116 (H) 65 - 99 mg/dL  Glucose, capillary     Status: Abnormal   Collection Time: 05/06/18  4:54 PM  Result Value Ref Range   Glucose-Capillary 213 (H) 65 - 99 mg/dL   Comment 1 Notify RN   Glucose, capillary     Status: Abnormal   Collection Time: 05/06/18  9:15 PM  Result Value Ref Range   Glucose-Capillary 136 (H) 65 - 99 mg/dL   Comment 1 Notify RN   Basic metabolic panel      Status: Abnormal   Collection Time: 05/07/18  5:41 AM  Result Value Ref Range   Sodium 145 135 - 145 mmol/L   Potassium 3.7 3.5 - 5.1 mmol/L   Chloride 96 (L) 101 - 111 mmol/L   CO2 38 (H) 22 - 32 mmol/L   Glucose, Bld 125 (H) 65 - 99 mg/dL   BUN 34 (H) 6 - 20 mg/dL   Creatinine, Ser 1.71 (H) 0.61 - 1.24 mg/dL   Calcium 8.8 (L) 8.9 - 10.3 mg/dL   GFR calc non Af Amer 38 (L) >60 mL/min   GFR calc Af Amer 44 (L) >60 mL/min    Comment: (NOTE) The eGFR has been calculated using the CKD EPI equation. This calculation has not been validated in all clinical situations. eGFR's persistently <60 mL/min signify possible Chronic Kidney Disease.    Anion gap 11 5 - 15    Comment: Performed at [facility] [address], [place], [place] [address]  CBC with Differential/Platelet     Status: Abnormal   Collection Time: 05/07/18  5:41 AM  Result Value Ref Range   WBC 5.3 4.0 - 10.5 K/uL   RBC 3.30 (L) 4.22 - 5.81 MIL/uL   Hemoglobin 9.4 (L) 13.0 - 17.0 g/dL   HCT 31.7 (L) 39.0 - 52.0 %   MCV 96.1 78.0 - 100.0 fL   MCH 28.5 26.0 - 34.0 pg   MCHC 29.7 (L) 30.0 - 36.0 g/dL   RDW 16.4 (H) 11.5 - 15.5 %   Platelets 134 (L) 150 - 400 K/uL   Neutrophils Relative % 71 %   Neutro Abs 3.7 1.7 - 7.7 K/uL   Lymphocytes Relative 18 %   Lymphs Abs 1.0 0.7 - 4.0 K/uL   Monocytes Relative 8 %   Monocytes Absolute 0.4 0.1 - 1.0 K/uL   Eosinophils Relative 3 %   Eosinophils Absolute 0.2 0.0 - 0.7 K/uL   Basophils Relative 0 %   Basophils Absolute 0.0 0.0 - 0.1 K/uL    Comment: Performed at [facility], [address] [place], [place] [address]  Glucose, capillary     Status: Abnormal   Collection Time: 05/07/18  7:07 AM  Result Value Ref Range   Glucose-Capillary 112 (H) 65 - 99 mg/dL  Glucose, capillary     Status: Abnormal  Collection Time: 05/07/18 11:51 AM  Result Value Ref Range   Glucose-Capillary 244 (H) 65 - 99 mg/dL  Glucose, capillary     Status: Abnormal   Collection  Time: 05/07/18  4:40 PM  Result Value Ref Range   Glucose-Capillary 162 (H) 65 - 99 mg/dL  Glucose, capillary     Status: Abnormal   Collection Time: 05/07/18  8:59 PM  Result Value Ref Range   Glucose-Capillary 138 (H) 65 - 99 mg/dL  Glucose, capillary     Status: Abnormal   Collection Time: 05/08/18  6:47 AM  Result Value Ref Range   Glucose-Capillary 131 (H) 65 - 99 mg/dL    Constitutional: No distress . Vital signs reviewed. HEENT: EOMI, oral membranes moist Neck: supple Cardiovascular: RRR without murmur. No JVD    Respiratory: CTA Bilaterally without wheezes or rales. Normal effort, +[place] GI: BS +, non-tender, non-distended Musc/Skel:  LE edema. No tenderness. Unna dressings Neuro: Alert and Oriented  Motor: 5/5 BUE proximal to distal BLE 3/5 HF, 4/5 distally  (stable) Skin: Unna boots to bilateral lower extremities Surgical incision with dressing C/D/I  Assessment/Plan: 1. 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 Physiatrist is providing close team supervision and 24 hour management of active medical problems listed below. Physiatrist and rehab team continue to assess barriers to discharge/monitor patient progress toward functional and medical goals. FIM: Function - Bathing Position: (seated on toilet) Body parts bathed by patient: Right arm, Left arm, Chest, Abdomen, Front perineal area, Right upper leg, Left upper leg Body parts bathed by helper: Buttocks, Back Bathing not applicable: Left lower leg, Right lower leg(Unna boots) Assist Level: Touching or steadying assistance(Pt > 75%)  Function- Upper Body Dressing/Undressing What is the patient wearing?: Pull over shirt/dress Pull over shirt/dress - Perfomed by patient: Thread/unthread right sleeve, Thread/unthread left sleeve, Put head through opening, Pull shirt over trunk Assist Level: Set up Function - Lower  Body Dressing/Undressing What is the patient wearing?: Pants Position: Wheelchair/chair at sink Pants- Performed by patient: Thread/unthread right pants leg, Thread/unthread left pants leg, Pull pants up/down Pants- Performed by helper: Pull pants up/down Non-skid slipper socks- Performed by helper: Don/doff right sock, Don/doff left sock TED Hose - Performed by helper: Don/doff right TED hose, Don/doff left TED hose Assist for footwear: Maximal assist Assist for lower body dressing: Assistive device, Touching or steadying assistance (Pt > 75%) Assistive Device Comment: reacher  Function - Toileting Toileting activity did not occur: No continent bowel/bladder event Toileting steps completed by patient: Adjust clothing prior to toileting, Adjust clothing after toileting Toileting steps completed by helper: Performs perineal hygiene Toileting Assistive Devices: Grab bar or rail Assist level: Touching or steadying assistance (Pt.75%)  Function - Air cabin crew transfer activity did not occur: Safety/medical concerns Toilet transfer assistive device: Bedside commode, Walker Mechanical lift: Stedy Assist level to toilet: Maximal assist (Pt 25 - 49%/lift and lower) Assist level from toilet: Maximal assist (Pt 25 - 49%/lift and lower) Assist level to bedside commode (at bedside): Moderate assist (Pt 50 - 74%/lift or lower) Assist level from bedside commode (at bedside): Moderate assist (Pt 50 - 74%/lift or lower)  Function - Chair/bed transfer Chair/bed transfer method: Stand pivot Chair/bed transfer assist level: Touching or steadying assistance (Pt > 75%) Chair/bed transfer assistive device: Walker Mechanical lift: Stedy Chair/bed transfer details: Verbal cues for safe use of DME/AE, Manual facilitation for placement, Manual facilitation for weight shifting,  Verbal cues for precautions/safety  Function - Locomotion: Wheelchair Will patient use wheelchair at discharge?:  Yes Type: Manual Max wheelchair distance: 159f Assist Level: Supervision or verbal cues Wheel 50 feet with 2 turns activity did not occur: Safety/medical concerns Assist Level: Supervision or verbal cues Wheel 150 feet activity did not occur: Safety/medical concerns Assist Level: Supervision or verbal cues Turns around,maneuvers to table,bed, and toilet,negotiates 3% grade,maneuvers on rugs and over doorsills: No Function - Locomotion: Ambulation Ambulation activity did not occur: [name] Assistive device: Walker-rolling Max distance: 617fAssist level: Supervision or verbal cues Assist level: Supervision or verbal cues Assist level: Touching or steadying assistance (Pt > 75%) Walk 150 feet activity did not occur: Safety/medical concerns  Function - Comprehension Comprehension: Auditory Comprehension assist level: Follows complex conversation/direction with extra time/assistive device  Function - Expression Expression: Verbal Expression assist level: Expresses complex 90% of the time/cues < 10% of the time  Function - Social Interaction Social Interaction assist level: Interacts appropriately 90% of the time - Needs monitoring or encouragement for participation or interaction.  Function - Problem Solving Problem solving assist level: Solves complex 90% of the time/cues < 10% of the time  Function - Memory Memory assist level: Recognizes or recalls 90% of the time/requires cueing < 10% of the time Patient normally able to recall (first 3 days only): Current season, Location of own room, Staff names and faces, That he or she is in a hospital  Medical Problem List and Plan: 1.  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  DVT Prophylaxis/Anticoagulation: SCDs.      Vascular study negative but with unna boots limited study 3. Pain Management: Neurontin 400 mg 3 times daily 4.  Mood: Lexapro 10 mg daily 5. Neuropsych: This patient is capable of making decisions on his own behalf. 6. Skin/Wound Care: Pressure relief measures.    Keflex started on 5/1 for surgical wound drainage, changed to Zosyn on 5/4-5/10---completed 7. Fluids/Electrolytes/Nutrition: Strict I/Os. Heart healthy diet. Monitor voiding function.  8. OSA with acute hypercarbic respiratory failure: Has to use BIPAP or CPAP at nights  Weaning supplemental oxygen as tolerated  CXR reviewed, suggesting atelectasis  Encourage IS and OOB 9. Acute on chronic diastolic CHF:  Improved with lasix which has been discontinued and changed to [facility] has also been stopped.  Hydralazine dose adjusted. Monitor weights daily with strict I/Os.       Filed Weights   05/06/18 2201 05/07/18 0710 05/08/18 0536  Weight: 126.2 kg (278 lb 3.5 oz) 127 kg (279 lb 15.8 oz) 126.1 kg (278 lb)    Stable on 5/11  Managed by cards, appreciate recs 10 Acute on chronic renal failure.   Hydralazine 50 mg 3 times daily   Hold ARB per Cards   Cr 1.71 on 5/10  Encourage appropriate fluid intake 11. Morbid obesity: BMI 40.46.  12. Hypoxic encephalopathy:   Resolved    limit sedating medications. Encourage IS. CPAP whenever asleep. 13.  Hypertension.  Lopressor 25 mg twice daily, increased to 50 BID on 5/3.    Reasonable control  Monitor with increased mobility 14.  BPH.  Flomax 0.4 mg daily, Toviaz 8 mg daily.   15.  Diabetes mellitus with peripheral neuropathy.  Hemoglobin A1c 7.7.  SSI.  Check blood sugars before meals and at bedtime   On amaryl at home, resumed. Titrate regimen as needed   Poor control   -increase amaryl  to 40m 16.  Fevers.  Resolved. Blood cultures no growth.  Urine culture negative. Completed course of Omnicef 17.  Hypothyroidism.  Synthroid 18.  Constipation.  Laxative assistance, increased Senokot to 2 tablets twice a day, continue MiraLAX 1 packet/day   Dulcolax prn 19. Hypokalemia:   Potassium 3.7  on 5/10   Continue supplementation   Cont to monitor 20. Acute blood loss anemia  Hemoglobin 9.4 on 5/10  Continue to monitor 21. Acute lower UTI  UA+, Ucx with greater than 100,000 Pseudomonas and greater than 40,000 Klebsiella, sensitivities reviewed  Keflex changed to cefepime , Changed to Zosyn on 5/3 due to multidrug resistances--zosyn now complete 22. Thrombocytopenia  Platelets 134 on 5/10  Labs pending for today  -?zosyn effect  LOS (Days) 22 A FACE TO FACE EVALUATION WAS PERFORMED  ZMeredith Staggers5/10/2018, 7:41 AM

## 2018-05-09 LAB — GLUCOSE, CAPILLARY
GLUCOSE-CAPILLARY: 164 mg/dL — AB (ref 65–99)
GLUCOSE-CAPILLARY: 180 mg/dL — AB (ref 65–99)
Glucose-Capillary: 144 mg/dL — ABNORMAL HIGH (ref 65–99)
Glucose-Capillary: 148 mg/dL — ABNORMAL HIGH (ref 65–99)

## 2018-05-09 NOTE — Progress Notes (Signed)
Subjective/Complaints: No problems overnight.  Slept well.  Tolerated CPAP.  Feels that he is tolerating physical activity better as well  ROS: Patient denies fever, rash, sore throat, blurred vision, nausea, vomiting, diarrhea, cough, shortness of breath or chest pain, joint or back pain, headache, or mood change.    Objective: Vital Signs: Blood pressure (!) 143/56, pulse 62, temperature 98.4 F (36.9 C), temperature source Oral, resp. rate 17, height 6' 1"  (1.854 m), weight 127.8 kg (281 lb 12 oz), SpO2 91 %. No results found. Results for orders placed or performed during the hospital encounter of 04/16/18 (from the past 72 hour(s))  Glucose, capillary     Status: Abnormal   Collection Time: 05/06/18 11:40 AM  Result Value Ref Range   Glucose-Capillary 116 (H) 65 - 99 mg/dL  Glucose, capillary     Status: Abnormal   Collection Time: 05/06/18  4:54 PM  Result Value Ref Range   Glucose-Capillary 213 (H) 65 - 99 mg/dL   Comment 1 Notify RN   Glucose, capillary     Status: Abnormal   Collection Time: 05/06/18  9:15 PM  Result Value Ref Range   Glucose-Capillary 136 (H) 65 - 99 mg/dL   Comment 1 Notify RN   Basic metabolic panel     Status: Abnormal   Collection Time: 05/07/18  5:41 AM  Result Value Ref Range   Sodium 145 135 - 145 mmol/L   Potassium 3.7 3.5 - 5.1 mmol/L   Chloride 96 (L) 101 - 111 mmol/L   CO2 38 (H) 22 - 32 mmol/L   Glucose, Bld 125 (H) 65 - 99 mg/dL   BUN 34 (H) 6 - 20 mg/dL   Creatinine, Ser 1.71 (H) 0.61 - 1.24 mg/dL   Calcium 8.8 (L) 8.9 - 10.3 mg/dL   GFR calc non Af Amer 38 (L) >60 mL/min   GFR calc Af Amer 44 (L) >60 mL/min    Comment: (NOTE) The eGFR has been calculated using the CKD EPI equation. This calculation has not been validated in all clinical situations. eGFR's persistently <60 mL/min signify possible Chronic Kidney Disease.    Anion gap 11 5 - 15    Comment: Performed at [facility] [address] [place], [place] [address]   CBC with Differential/Platelet     Status: Abnormal   Collection Time: 05/07/18  5:41 AM  Result Value Ref Range   WBC 5.3 4.0 - 10.5 K/uL   RBC 3.30 (L) 4.22 - 5.81 MIL/uL   Hemoglobin 9.4 (L) 13.0 - 17.0 g/dL   HCT 31.7 (L) 39.0 - 52.0 %   MCV 96.1 78.0 - 100.0 fL   MCH 28.5 26.0 - 34.0 pg   MCHC 29.7 (L) 30.0 - 36.0 g/dL   RDW 16.4 (H) 11.5 - 15.5 %   Platelets 134 (L) 150 - 400 K/uL   Neutrophils Relative % 71 %   Neutro Abs 3.7 1.7 - 7.7 K/uL   Lymphocytes Relative 18 %   Lymphs Abs 1.0 0.7 - 4.0 K/uL   Monocytes Relative 8 %   Monocytes Absolute 0.4 0.1 - 1.0 K/uL   Eosinophils Relative 3 %   Eosinophils Absolute 0.2 0.0 - 0.7 K/uL   Basophils Relative 0 %   Basophils Absolute 0.0 0.0 - 0.1 K/uL    Comment: Performed at [facility], [address], [place], [place] [address]  Glucose, capillary     Status: Abnormal   Collection Time: 05/07/18  7:07  AM  Result Value Ref Range   Glucose-Capillary 112 (H) 65 - 99 mg/dL  Glucose, capillary     Status: Abnormal   Collection Time: 05/07/18 11:51 AM  Result Value Ref Range   Glucose-Capillary 244 (H) 65 - 99 mg/dL  Glucose, capillary     Status: Abnormal   Collection Time: 05/07/18  4:40 PM  Result Value Ref Range   Glucose-Capillary 162 (H) 65 - 99 mg/dL  Glucose, capillary     Status: Abnormal   Collection Time: 05/07/18  8:59 PM  Result Value Ref Range   Glucose-Capillary 138 (H) 65 - 99 mg/dL  Glucose, capillary     Status: Abnormal   Collection Time: 05/08/18  6:47 AM  Result Value Ref Range   Glucose-Capillary 131 (H) 65 - 99 mg/dL  Glucose, capillary     Status: Abnormal   Collection Time: 05/08/18 11:17 AM  Result Value Ref Range   Glucose-Capillary 163 (H) 65 - 99 mg/dL  Glucose, capillary     Status: Abnormal   Collection Time: 05/08/18  4:48 PM  Result Value Ref Range   Glucose-Capillary 127 (H) 65 - 99 mg/dL   Comment 1 Notify RN   Glucose, capillary     Status: Abnormal   Collection Time:  05/08/18  8:29 PM  Result Value Ref Range   Glucose-Capillary 180 (H) 65 - 99 mg/dL  Glucose, capillary     Status: Abnormal   Collection Time: 05/09/18  6:38 AM  Result Value Ref Range   Glucose-Capillary 144 (H) 65 - 99 mg/dL    Constitutional: No distress . Vital signs reviewed. HEENT: EOMI, oral membranes moist Neck: supple Cardiovascular: RRR without murmur. No JVD    Respiratory: CTA Bilaterally without wheezes or rales. Normal effort. CPAP    GI: BS +, non-tender, non-distended  Musc/Skel:  LE edema. No tenderness. Unna dressings Neuro: Alert and Oriented  Motor: 5/5 BUE proximal to distal BLE 3/5 HF, 4/5 distally  (stable) Skin: Unna boots to bilateral lower extremities Surgical incision with dressing C/D/I  Assessment/Plan: 1. 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 Physiatrist is providing close team supervision and 24 hour management of active medical problems listed below. Physiatrist and rehab team continue to assess barriers to discharge/monitor patient progress toward functional and medical goals. FIM: Function - Bathing Position: (seated on toilet) Body parts bathed by patient: Right arm, Left arm, Chest, Abdomen, Front perineal area, Right upper leg, Left upper leg Body parts bathed by helper: Buttocks, Back Bathing not applicable: Left lower leg, Right lower leg(Unna boots) Assist Level: Touching or steadying assistance(Pt > 75%)  Function- Upper Body Dressing/Undressing What is the patient wearing?: Pull over shirt/dress Pull over shirt/dress - Perfomed by patient: Thread/unthread right sleeve, Thread/unthread left sleeve, Put head through opening, Pull shirt over trunk Assist Level: Set up Function - Lower Body Dressing/Undressing What is the patient wearing?: Pants Position: Wheelchair/chair at sink Pants- Performed by patient: Thread/unthread right  pants leg, Thread/unthread left pants leg, Pull pants up/down Pants- Performed by helper: Pull pants up/down Non-skid slipper socks- Performed by helper: Don/doff right sock, Don/doff left sock TED Hose - Performed by helper: Don/doff right TED hose, Don/doff left TED hose Assist for footwear: Maximal assist Assist for lower body dressing: Assistive device, Touching or steadying assistance (Pt > 75%) Assistive Device Comment: reacher  Function - Toileting Toileting activity did not occur: No continent bowel/bladder event Toileting  steps completed by patient: Adjust clothing prior to toileting, Adjust clothing after toileting Toileting steps completed by helper: Performs perineal hygiene Toileting Assistive Devices: Grab bar or rail Assist level: Touching or steadying assistance (Pt.75%)  Function - Air cabin crew transfer activity did not occur: Safety/medical concerns Toilet transfer assistive device: Bedside commode, Walker Mechanical lift: Stedy Assist level to toilet: Maximal assist (Pt 25 - 49%/lift and lower) Assist level from toilet: Maximal assist (Pt 25 - 49%/lift and lower) Assist level to bedside commode (at bedside): Moderate assist (Pt 50 - 74%/lift or lower) Assist level from bedside commode (at bedside): Moderate assist (Pt 50 - 74%/lift or lower)  Function - Chair/bed transfer Chair/bed transfer method: Stand pivot Chair/bed transfer assist level: Touching or steadying assistance (Pt > 75%) Chair/bed transfer assistive device: Walker Mechanical lift: Stedy Chair/bed transfer details: Verbal cues for safe use of DME/AE, Manual facilitation for placement, Manual facilitation for weight shifting, Verbal cues for precautions/safety  Function - Locomotion: Wheelchair Will patient use wheelchair at discharge?: Yes Type: Manual Max wheelchair distance: 123f Assist Level: Supervision or verbal cues Wheel 50 feet with 2 turns activity did not occur: Safety/medical  concerns Assist Level: Supervision or verbal cues Wheel 150 feet activity did not occur: Safety/medical concerns Assist Level: Supervision or verbal cues Turns around,maneuvers to table,bed, and toilet,negotiates 3% grade,maneuvers on rugs and over doorsills: No Function - Locomotion: Ambulation Ambulation activity did not occur: [name] Assistive device: Walker-rolling Max distance: 657fAssist level: Supervision or verbal cues Assist level: Supervision or verbal cues Assist level: Touching or steadying assistance (Pt > 75%) Walk 150 feet activity did not occur: Safety/medical concerns  Function - Comprehension Comprehension: Auditory Comprehension assist level: Follows complex conversation/direction with extra time/assistive device  Function - Expression Expression: Verbal Expression assist level: Expresses complex 90% of the time/cues < 10% of the time  Function - Social Interaction Social Interaction assist level: Interacts appropriately 90% of the time - Needs monitoring or encouragement for participation or interaction.  Function - Problem Solving Problem solving assist level: Solves complex 90% of the time/cues < 10% of the time  Function - Memory Memory assist level: Recognizes or recalls 90% of the time/requires cueing < 10% of the time Patient normally able to recall (first 3 days only): Current season, Location of own room, Staff names and faces, That he or she is in a hospital  Medical Problem List and Plan: 1.  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  DVT Prophylaxis/Anticoagulation: SCDs.      Vascular study negative but with unna boots limited study 3. Pain Management: Neurontin 400 mg 3 times daily 4. Mood: Lexapro 10 mg daily 5. Neuropsych: This patient is capable of making decisions on his own behalf. 6. Skin/Wound Care: Pressure relief measures.    Keflex  started on 5/1 for surgical wound drainage, changed to Zosyn on 5/4-5/10---abx completed 7. Fluids/Electrolytes/Nutrition: Strict I/Os. Heart healthy diet. Monitor voiding function.  8. OSA with acute hypercarbic respiratory failure: Has to use BIPAP or CPAP at nights  Weaning supplemental oxygen as tolerated  CXR reviewed, suggesting atelectasis  Encourage IS and OOB 9. Acute on chronic diastolic CHF:  Improved with lasix which has been discontinued and [facility].  Hydralazine dose adjusted. Monitor weights daily with strict I/Os.       Filed Weights   05/07/18 0710 05/08/18 0536 05/09/18 0538  Weight: 127  kg (279 lb 15.8 oz) 126.1 kg (278 lb) 127.8 kg (281 lb 12 oz)    Stable on 5/12  Managed by cards, appreciate recs 10 Acute on chronic renal failure.   Hydralazine 50 mg 3 times daily   Hold ARB per Cards   Cr 1.71 on 5/10  Encourage appropriate fluid intake 11. Morbid obesity: BMI 40.46.  12. Hypoxic encephalopathy:   Resolved    limit sedating medications. Encourage IS. CPAP whenever asleep. 13.  Hypertension.  Lopressor 25 mg twice daily, increased to 50 BID on 5/3.    Reasonable control 5/12  Monitor with increased mobility 14.  BPH.  Flomax 0.4 mg daily, Toviaz 8 mg daily.   15.  Diabetes mellitus with peripheral neuropathy.  Hemoglobin A1c 7.7.  SSI.  Check blood sugars before meals and at bedtime   On amaryl at home, resumed. Titrate regimen as needed   -increased amaryl to 63m on 5/11 with improved control so far 16.  Fevers.  Resolved. Blood cultures no growth.  Urine culture negative. Completed course of Omnicef 17.  Hypothyroidism.  Synthroid 18.  Constipation.  Laxative assistance, increased Senokot to 2 tablets twice a day, continue MiraLAX 1 packet/day   Dulcolax prn 19. Hypokalemia:   Potassium 3.7 on 5/10   Continue supplementation   Cont to monitor--follow up monday 20. Acute blood loss anemia  Hemoglobin 9.4 on  5/10  Continue to monitor 21. Acute lower UTI  UA+, Ucx with greater than 100,000 Pseudomonas and greater than 40,000 Klebsiella, sensitivities reviewed  Keflex changed to cefepime , Changed to Zosyn on 5/3 due to multidrug resistances--zosyn now complete 22. Thrombocytopenia  Platelets 134 on 5/10  Follow up labs monday  -?zosyn effect  LOS (Days) 23 A FACE TO FACE EVALUATION WAS PERFORMED  ZMeredith Staggers5/11/2018, 7:21 AM

## 2018-05-10 ENCOUNTER — Inpatient Hospital Stay (HOSPITAL_COMMUNITY): Payer: Medicare Other | Admitting: Physical Therapy

## 2018-05-10 ENCOUNTER — Inpatient Hospital Stay (HOSPITAL_COMMUNITY): Payer: Medicare Other | Admitting: Occupational Therapy

## 2018-05-10 LAB — BASIC METABOLIC PANEL
Anion gap: 9 (ref 5–15)
BUN: 38 mg/dL — ABNORMAL HIGH (ref 6–20)
CALCIUM: 9.1 mg/dL (ref 8.9–10.3)
CO2: 38 mmol/L — ABNORMAL HIGH (ref 22–32)
Chloride: 97 mmol/L — ABNORMAL LOW (ref 101–111)
Creatinine, Ser: 1.63 mg/dL — ABNORMAL HIGH (ref 0.61–1.24)
GFR, EST AFRICAN AMERICAN: 47 mL/min — AB (ref >60)
GFR, EST NON AFRICAN AMERICAN: 40 mL/min — AB (ref >60)
Glucose, Bld: 149 mg/dL — ABNORMAL HIGH (ref 65–99)
POTASSIUM: 3.6 mmol/L (ref 3.5–5.1)
SODIUM: 144 mmol/L (ref 135–145)

## 2018-05-10 LAB — CBC WITH DIFFERENTIAL/PLATELET
BASOS ABS: 0 10*3/uL (ref 0.0–0.1)
BASOS PCT: 0 %
Eosinophils Absolute: 0.2 10*3/uL (ref 0.0–0.7)
Eosinophils Relative: 3 %
HEMATOCRIT: 33.6 % — AB (ref 39.0–52.0)
Hemoglobin: 9.9 g/dL — ABNORMAL LOW (ref 13.0–17.0)
LYMPHS PCT: 19 %
Lymphs Abs: 1.1 10*3/uL (ref 0.7–4.0)
MCH: 28.2 pg (ref 26.0–34.0)
MCHC: 29.5 g/dL — ABNORMAL LOW (ref 30.0–36.0)
MCV: 95.7 fL (ref 78.0–100.0)
Monocytes Absolute: 0.3 10*3/uL (ref 0.1–1.0)
Monocytes Relative: 6 %
NEUTROS ABS: 4 10*3/uL (ref 1.7–7.7)
NEUTROS PCT: 72 %
PLATELETS: 139 10*3/uL — AB (ref 150–400)
RBC: 3.51 MIL/uL — AB (ref 4.22–5.81)
RDW: 16.4 % — AB (ref 11.5–15.5)
WBC: 5.5 10*3/uL (ref 4.0–10.5)

## 2018-05-10 LAB — GLUCOSE, CAPILLARY
GLUCOSE-CAPILLARY: 130 mg/dL — AB (ref 65–99)
GLUCOSE-CAPILLARY: 180 mg/dL — AB (ref 65–99)
GLUCOSE-CAPILLARY: 143 mg/dL — AB (ref 65–99)
Glucose-Capillary: 127 mg/dL — ABNORMAL HIGH (ref 65–99)

## 2018-05-10 NOTE — Progress Notes (Signed)
Subjective/Complaints: Patient seen lying in bed this morning. He states he slept well overnight. He has questions about his Unna boots.  ROS: Denies CP, SOB, N/V/D  Objective: Vital Signs: Blood pressure (!) 144/61, pulse 61, temperature 98.3 F (36.8 C), temperature source Oral, resp. rate 18, height 6\' 1"  (1.854 m), weight 129 kg (284 lb 6.3 oz), SpO2 (!) 87 %. No results found. Results for orders placed or performed during the hospital encounter of 04/16/18 (from the past 72 hour(s))  Glucose, capillary     Status: Abnormal   Collection Time: 05/07/18 11:51 AM  Result Value Ref Range   Glucose-Capillary 244 (H) 65 - 99 mg/dL  Glucose, capillary     Status: Abnormal   Collection Time: 05/07/18  4:40 PM  Result Value Ref Range   Glucose-Capillary 162 (H) 65 - 99 mg/dL  Glucose, capillary     Status: Abnormal   Collection Time: 05/07/18  8:59 PM  Result Value Ref Range   Glucose-Capillary 138 (H) 65 - 99 mg/dL  Glucose, capillary     Status: Abnormal   Collection Time: 05/08/18  6:47 AM  Result Value Ref Range   Glucose-Capillary 131 (H) 65 - 99 mg/dL  Glucose, capillary     Status: Abnormal   Collection Time: 05/08/18 11:17 AM  Result Value Ref Range   Glucose-Capillary 163 (H) 65 - 99 mg/dL  Glucose, capillary     Status: Abnormal   Collection Time: 05/08/18  4:48 PM  Result Value Ref Range   Glucose-Capillary 127 (H) 65 - 99 mg/dL   Comment 1 Notify RN   Glucose, capillary     Status: Abnormal   Collection Time: 05/08/18  8:29 PM  Result Value Ref Range   Glucose-Capillary 180 (H) 65 - 99 mg/dL  Glucose, capillary     Status: Abnormal   Collection Time: 05/09/18  6:38 AM  Result Value Ref Range   Glucose-Capillary 144 (H) 65 - 99 mg/dL  Glucose, capillary     Status: Abnormal   Collection Time: 05/09/18 11:59 AM  Result Value Ref Range   Glucose-Capillary 164 (H) 65 - 99 mg/dL  Glucose, capillary     Status: Abnormal   Collection Time: 05/09/18  4:27 PM  Result  Value Ref Range   Glucose-Capillary 180 (H) 65 - 99 mg/dL  Glucose, capillary     Status: Abnormal   Collection Time: 05/09/18  9:27 PM  Result Value Ref Range   Glucose-Capillary 148 (H) 65 - 99 mg/dL   Comment 1 Notify RN   Glucose, capillary     Status: Abnormal   Collection Time: 05/10/18  6:15 AM  Result Value Ref Range   Glucose-Capillary 130 (H) 65 - 99 mg/dL    Constitutional: No distress . Vital signs reviewed. Obese. HENT: Normocephalic.  Atraumatic. Eyes: EOMI. No discharge. Cardiovascular: RRR. No JVD. Respiratory: CTA Bilaterally. Normal effort. GI: BS +. Non-distended. Musc/Skel:  LE edema. No tenderness. Unna dressings Neuro: Alert and Oriented  Motor: 5/5 BUE proximal to distal BLE 3/5 HF, 4/5 distally (improving) Skin: Unna boots to bilateral lower extremities. Surgical incision with dressing C/D/I  Assessment/Plan: 1. 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 Physiatrist is providing close team supervision and 24 hour management of active medical problems listed below. Physiatrist and rehab team continue to assess barriers to discharge/monitor patient progress toward functional and medical goals. FIM: Function - Bathing Position: (seated on  toilet) Body parts bathed by patient: Right arm, Left arm, Chest, Abdomen, Front perineal area, Right upper leg, Left upper leg Body parts bathed by helper: Buttocks, Back Bathing not applicable: Left lower leg, Right lower leg(Unna boots) Assist Level: Touching or steadying assistance(Pt > 75%)  Function- Upper Body Dressing/Undressing What is the patient wearing?: Pull over shirt/dress Pull over shirt/dress - Perfomed by patient: Thread/unthread right sleeve, Thread/unthread left sleeve, Put head through opening, Pull shirt over trunk Assist Level: Set up Function - Lower Body Dressing/Undressing What is the  patient wearing?: Pants Position: Wheelchair/chair at sink Pants- Performed by patient: Thread/unthread right pants leg, Thread/unthread left pants leg, Pull pants up/down Pants- Performed by helper: Pull pants up/down Non-skid slipper socks- Performed by helper: Don/doff right sock, Don/doff left sock TED Hose - Performed by helper: Don/doff right TED hose, Don/doff left TED hose Assist for footwear: Maximal assist Assist for lower body dressing: Assistive device, Touching or steadying assistance (Pt > 75%) Assistive Device Comment: reacher  Function - Toileting Toileting activity did not occur: No continent bowel/bladder event Toileting steps completed by patient: Adjust clothing prior to toileting, Adjust clothing after toileting Toileting steps completed by helper: Performs perineal hygiene Toileting Assistive Devices: Grab bar or rail Assist level: Touching or steadying assistance (Pt.75%)  Function - Air cabin crew transfer activity did not occur: Safety/medical concerns Toilet transfer assistive device: Bedside commode, Walker Mechanical lift: Stedy Assist level to toilet: Maximal assist (Pt 25 - 49%/lift and lower) Assist level from toilet: Maximal assist (Pt 25 - 49%/lift and lower) Assist level to bedside commode (at bedside): Moderate assist (Pt 50 - 74%/lift or lower) Assist level from bedside commode (at bedside): Moderate assist (Pt 50 - 74%/lift or lower)  Function - Chair/bed transfer Chair/bed transfer method: Stand pivot Chair/bed transfer assist level: Touching or steadying assistance (Pt > 75%) Chair/bed transfer assistive device: Walker Mechanical lift: Stedy Chair/bed transfer details: Verbal cues for safe use of DME/AE, Manual facilitation for placement, Manual facilitation for weight shifting, Verbal cues for precautions/safety  Function - Locomotion: Wheelchair Will patient use wheelchair at discharge?: Yes Type: Manual Max wheelchair distance:  145ft Assist Level: Supervision or verbal cues Wheel 50 feet with 2 turns activity did not occur: Safety/medical concerns Assist Level: Supervision or verbal cues Wheel 150 feet activity did not occur: Safety/medical concerns Assist Level: Supervision or verbal cues Turns around,maneuvers to table,bed, and toilet,negotiates 3% grade,maneuvers on rugs and over doorsills: No Function - Locomotion: Ambulation Ambulation activity did not occur: [name] Assistive device: Walker-rolling Max distance: 6ft Assist level: Supervision or verbal cues Assist level: Supervision or verbal cues Assist level: Touching or steadying assistance (Pt > 75%) Walk 150 feet activity did not occur: Safety/medical concerns  Function - Comprehension Comprehension: Auditory Comprehension assist level: Follows complex conversation/direction with extra time/assistive device  Function - Expression Expression: Verbal Expression assist level: Expresses complex 90% of the time/cues < 10% of the time  Function - Social Interaction Social Interaction assist level: Interacts appropriately 90% of the time - Needs monitoring or encouragement for participation or interaction.  Function - Problem Solving Problem solving assist level: Solves complex 90% of the time/cues < 10% of the time  Function - Memory Memory assist level: Recognizes or recalls 90% of the time/requires cueing < 10% of the time Patient normally able to recall (first 3 days only): Current season, Location of own room, Staff names and faces, That he or she is in a hospital  Medical Problem List and Plan:  1.  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  DVT Prophylaxis/Anticoagulation: SCDs.      Vascular study negative but with unna boots limited study 3. Pain Management: Neurontin 400 mg 3 times daily 4. Mood: Lexapro 10 mg daily 5. Neuropsych: This patient is  capable of making decisions on his own behalf. 6. Skin/Wound Care: Pressure relief measures.    Keflex started on 5/1 for surgical wound drainage, changed to Zosyn 5/4-5/10 7. Fluids/Electrolytes/Nutrition: Strict I/Os. Heart healthy diet. Monitor voiding function.  8. OSA with acute hypercarbic respiratory failure: Has to use BIPAP or CPAP at nights  Weaning supplemental oxygen as tolerated  CXR reviewed, suggesting atelectasis  Encourage IS and OOB 9. Acute on chronic diastolic CHF:  Improved with lasix which has been discontinued and changed to [facility] which has also been stopped.  Hydralazine dose adjusted. Monitor weights daily with strict I/Os.       Filed Weights   05/08/18 0536 05/09/18 0538 05/10/18 0500  Weight: 126.1 kg (278 lb) 127.8 kg (281 lb 12 oz) 129 kg (284 lb 6.3 oz)    ?Trending up  Managed by cards, appreciate recs 10 Acute on chronic renal failure.   Hydralazine 50 mg 3 times daily   Hold ARB per Cards   Cr 1.71 on 5/10  Encourage appropriate fluid intake 11. Morbid obesity: BMI 40.46.  12. Hypoxic encephalopathy:   Resolved    limit sedating medications. Encourage IS. CPAP whenever asleep. 13.  Hypertension.  Lopressor 25 mg twice daily, increased to 50 BID on 5/3.    Elevated on 5/13  Monitor with increased mobility 14.  BPH.  Flomax 0.4 mg daily, Toviaz 8 mg daily.   15.  Diabetes mellitus with peripheral neuropathy.  Hemoglobin A1c 7.7.  SSI.  Check blood sugars before meals and at bedtime   On amaryl at home, resumed. Titrate regimen as needed   Increased amaryl to 2mg  on 5/11  Elevated, but improved on 5/13 16.  Fevers.  Resolved. Blood cultures no growth.  Urine culture negative. Completed course of Omnicef 17.  Hypothyroidism.  Synthroid 18.  Constipation.  Laxative assistance, increased Senokot to 2 tablets twice a day, continue MiraLAX 1 packet/day   Dulcolax prn 19. Hypokalemia:   Potassium 3.7 on 5/10   Continue supplementation   Cont to  monitor--follow up monday 20. Acute blood loss anemia  Hemoglobin 9.4 on 5/10  Continue to monitor 21. Acute lower UTI  UA+, Ucx with greater than 100,000 Pseudomonas and greater than 40,000 Klebsiella, sensitivities reviewed  Keflex changed to cefepime , Changed to Zosyn on 5/3 due to multidrug resistances, zosyn completed 22. Thrombocytopenia  Platelets 134 on 5/10  Labs pending  LOS (Days) 24 A FACE TO FACE EVALUATION WAS PERFORMED  Ankit Lorie Phenix 05/10/2018, 7:55 AM

## 2018-05-10 NOTE — Progress Notes (Signed)
Physical Therapy Session Note  Patient Details  Name: Travis [name] MRN: [id number] Date of Birth: [birth year]  Today's Date: 05/10/2018 PT Individual Time: 1000-1100 PT Individual Time Calculation (min): 60 min   Short Term Goals: Week 4:  PT Short Term Goal 1 (Week 4): STG=LTG due to ELOS   Skilled Therapeutic Interventions/Progress Updates: Pt presented in w/c agreeable to therapy. Pt propelled to hallway and performed sit to stand form w/c with mod/maxA, pt ambulaated 24f with RW to rehab gym. Pt then indicated urinary urgency and PTA transported pt back to room (+void). Pt propelled to rehab gym and participated in zoom ball 1 min increments x 3 for endurance with SpO2 dropping to 87% on 2L supplemental O2. Pt performed sit to stand form w/c continuing to require maxA and performed standing therex including standing SLR, mini-squats, hip abd/add to fatigue approx 10-12 bilaterally with pt requiring standing breaks between activities. Pt propelled back to room and remained in w/c with call bell within reach and needs met.      Therapy Documentation Precautions:  Precautions Precautions: Fall, Back, Cervical Restrictions Weight Bearing Restrictions: No General:   Vital Signs:  Pain: Pain Assessment Pain Score: 0-No pain  See Function Navigator for Current Functional Status.   Therapy/Group: Individual Therapy  Jay Haskew  Lemoyne Nestor, PTA  05/10/2018, 12:44 PM

## 2018-05-10 NOTE — Progress Notes (Signed)
Social Work Patient ID: DAIVON RAYOS, male   DOB: 1946/05/16, 72 y.o.   MRN: [id number]   Contacted today by pt's daughter, [name], who is expressing a lot of concern about pt's wife being able to manage pt's care needs at home.  Noted that we/ team has had the same concerns and that we need her to complete family ed to determine whether or not she can manage.  Daughter notes that the children are all supportive, however, they are working and have their own families.  She asks about the possibility of SNF after CIR.  Explained to her that insurance may not agree to cover SNF and pt may not agree to go to SNF.  I did reach out to the insurance CM to see if she could offer any insight as to whether they might cover SNF and she instructed  me to go ahead and submit notes to "[employer]" to see if they will give an answer but best to go ahead and get the ball rolling on that - I have done this.  Wife to be here tomorrow for family ed and daughter states family prepared to confront pt if it is clear that wife cannot manage and that SNF needs to be pursued (if covered.)  Explained that the only other option if pt is to d/c home would be for them to hire private duty caregivers to assist wife.  Will discuss with therapies this afternoon and make a more definite plan with pt/ family tomorrow.  Mcgregor Tinnon, LCSW

## 2018-05-10 NOTE — Progress Notes (Signed)
Physical Therapy Session Note  Patient Details  Name: LARONE KLIETHERMES MRN: [id number] Date of Birth: 1946-05-15  Today's Date: 05/10/2018 PT Individual Time: 1410-1450 PT Individual Time Calculation (min): 40 min   Short Term Goals: Week 4:  PT Short Term Goal 1 (Week 4): STG=LTG due to ELOS   Skilled Therapeutic Interventions/Progress Updates: Pt presented in w/c agreeable to therapy, propelled to rehab gym supervision however required increased rest breaks and noted increased exertion with activity,with SpO2 79% after w/c propulsion with recovery to 88% within 2 minutes. Participated in stand pivot transfer with pt requiring maxA for sit to stand transfer and min guard for stand pivot. Attempted sit to stand from mat with use of hand blocks however pt unable to complete without maxA and significant exertion. Pt returned to w/c via stand pivot and transported to day room. Participated in Cybex Kinetron 40cm/sec bouts to fatigue approx 3 min with desat to low 80s with recovery between 1-2 min. Discussed with pt signs of possible desat to not include only SOB but possible malaise etc, pt seems responsive. Pt transported back to room and remained in w/c with call bell within reach and needs met.      Therapy Documentation Precautions:  Precautions Precautions: Fall, Back, Cervical Restrictions Weight Bearing Restrictions: No  See Function Navigator for Current Functional Status.   Therapy/Group: Individual Therapy  Richards Pherigo  Birt Reinoso, PTA  05/10/2018, 3:38 PM

## 2018-05-10 NOTE — Progress Notes (Signed)
Patient is currently on his home BIPAP

## 2018-05-10 NOTE — Progress Notes (Signed)
Orthopedic Tech Progress Note Patient Details:  Travis [name] 1946-08-15 [id number]  Patient ID: Travis [name], male   DOB: 18-Apr-1946, 72 y.o.   MRN: [id number]   [name] 05/10/2018, 12:47 PMSPD out of unna boot wrap.

## 2018-05-10 NOTE — Progress Notes (Signed)
Occupational Therapy Session Note  Patient Details  Name: Travis [name] MRN: [id number] Date of Birth: [birth year]  Today's Date: 05/10/2018 OT Individual Time: 6433-2951 and 8841-6606 OT Individual Time Calculation (min): 60 min and 40 min   Short Term Goals: Week 4:  OT Short Term Goal 1 (Week 4): STG = LTGs due to remaining LOS  Skilled Therapeutic Interventions/Progress Updates:    1) Treatment session with focus on ADL retraining with functional transfers, sit > stand, dynamic standing balance, and use of AE to increase independence with LB dressing.  Pt received seated EOB reporting need to toilet.  Completed sit > stand from elevated EOB with RW and min assist.  Pt ambulated to [facility] with min assist with RW.  Pt able to doff pants in preparation to toileting, noted incontinent BM.  Pt finished BM on toilet and utilized urinal to void.  Min assist sit > stand with increased time and cues for BUE and BLE placement, therapist assisted with hygiene post incontinent BM.  Pt transferred to w/c with min assist with RW.  Engaged in bathing and dressing at sit > stand level at sink with pt utilizing reacher when threading pants. Mod assist sit > stand from w/c due to fatigue.  Pt able to pull pants over hips once upright.    O2 sats monitored throughout session.  Pt 91% at rest on 2L upon arrival.  Pt dropping to 79-80% on 2L with standing but would quickly return to 89-91% with pursed lip breathing.  Pt left seated upright in w/c engaging in breathing with incentive spirometer.  2) Treatment session with focus on sit <> stand for increased independence with self-care tasks.  Pt required max faded to min assist with sit > stand during session.  Therapist providing cues for hand and foot placement to increase ease of transitional movement.  Pt requires increase time to complete transitional movement due to decreased strength and endurance.  Therapist asking questions about additional family support as  wife will not be able to provide any lifting assistance with pt deferring responses stating that everything will be fine.  Pt required prolonged rest break after each sit > stand due to decreased endurance and O2 sats.  O2 sats dropping 84-86% on 2L O2 with each sit > stand.    Therapy Documentation Precautions:  Precautions Precautions: Fall, Back, Cervical Restrictions Weight Bearing Restrictions: No General:   Vital Signs: Therapy Vitals Temp: 98.3 F (36.8 C) Temp Source: Oral Pulse Rate: 61 Resp: 18 BP: (!) 144/61 Patient Position (if appropriate): Lying Oxygen Therapy SpO2: (!) 87 % O2 Device: Nasal Cannula O2 Flow Rate (L/min): 2 L/min Pain:  Pt with no c/o pain  See Function Navigator for Current Functional Status.   Therapy/Group: Individual Therapy  [name] 05/10/2018, 9:52 AM

## 2018-05-11 ENCOUNTER — Inpatient Hospital Stay (HOSPITAL_COMMUNITY): Payer: Medicare Other | Admitting: Occupational Therapy

## 2018-05-11 ENCOUNTER — Inpatient Hospital Stay (HOSPITAL_COMMUNITY): Payer: Medicare Other | Admitting: Physical Therapy

## 2018-05-11 DIAGNOSIS — R0902 Hypoxemia: Secondary | ICD-10-CM

## 2018-05-11 LAB — URINALYSIS, ROUTINE W REFLEX MICROSCOPIC
BILIRUBIN URINE: NEGATIVE
Bacteria, UA: NONE SEEN
GLUCOSE, UA: NEGATIVE mg/dL
Hgb urine dipstick: NEGATIVE
KETONES UR: NEGATIVE mg/dL
NITRITE: NEGATIVE
Protein, ur: NEGATIVE mg/dL
Specific Gravity, Urine: 1.011 (ref 1.005–1.030)
pH: 5 (ref 5.0–8.0)

## 2018-05-11 LAB — GLUCOSE, CAPILLARY
Glucose-Capillary: 95 mg/dL (ref 65–99)
Glucose-Capillary: 172 mg/dL — ABNORMAL HIGH (ref 65–99)
Glucose-Capillary: 178 mg/dL — ABNORMAL HIGH (ref 65–99)
Glucose-Capillary: 156 mg/dL — ABNORMAL HIGH (ref 65–99)

## 2018-05-11 NOTE — Progress Notes (Signed)
Patient is currently on his home CPAP with 4lpm bleed in.

## 2018-05-11 NOTE — Progress Notes (Signed)
Physical Therapy Session Note  Patient Details  Name: Travis [name] MRN: [id number] Date of Birth: August 06, 1946  Today's Date: 05/11/2018 PT Individual Time: 1100-1200 PT Individual Time Calculation (min): 60 min   Short Term Goals: Week 4:  PT Short Term Goal 1 (Week 4): STG=LTG due to ELOS   Skilled Therapeutic Interventions/Progress Updates: Pt presented in w/c with family present agreeable to therapy. Pt indicated did not sleep well previous night and feels fatigued. Pt propelled w/c to ortho gym supervision level with increased effort. Pt required extended rest after propulsion due to fatigue. Pt attempted car transfer at 43XU height replicating car that pt will go home in. Pt initially unable to perform sit to stand from w/c maxA x1. After rest pt able to perform sit to stand with mod/maxA x 2. Pt ambulated approx 19f with RW with increased L foot drag from previous sessions with pt unable to maintain knee full knee extension in stance phase. Pt was able to perform car transfer with supervision and increased time. Performed stand pivot return to w/c with minA from car to standing position due to elevated height and transfer with min guard. Pt required increased time with each activity for recovery due to fatigue however SpO2 maintained mis 80s with activity on 2 L supplemental O2. Pt transported back to room due to fatigue. Discussed with family concerns and safety with transfers upon d/c home. Discussed use of SB for functional transfers and that standing transfers will continue with HHPT. PTA provided pt with HEP and pt left in w/c with family present and needs met.      Therapy Documentation Precautions:  Precautions Precautions: Fall, Back, Cervical Restrictions Weight Bearing Restrictions: No General:   Vital Signs: Therapy Vitals Temp: 97.8 F (36.6 C) Temp Source: Oral Pulse Rate: (!) 58 Resp: (!) 24 BP: (!) 150/63 Patient Position (if appropriate): Lying Oxygen  Therapy SpO2: (!) 89 % O2 Device: Nasal Cannula Pain: Pain Assessment Pain Score: 0-No pain Mobility:   Locomotion :    Trunk/Postural Assessment :    Balance:   Exercises:   Other Treatments:     See Function Navigator for Current Functional Status.   Therapy/Group: Individual Therapy  Abrahm Mancia 05/11/2018, 4:09 PM

## 2018-05-11 NOTE — Progress Notes (Signed)
Occupational Therapy Session Note  Patient Details  Name: Travis [name] MRN: [id number] Date of Birth: October 13, 1946  Today's Date: 05/11/2018 OT Individual Time: 7741-2878 and 6767-2094 OT Individual Time Calculation (min): 60 min and 43 min   Short Term Goals: Week 4:  OT Short Term Goal 1 (Week 4): STG = LTGs due to remaining LOS  Skilled Therapeutic Interventions/Progress Updates:    1) Treatment session with focus on completing ADL retraining.  Pt received supine in bed reporting that he did not sleep well overnight.  O2 sats 91% on 4L O2 at rest.  Completed bed mobility with supervision with use of bed rails and HOB elevated.  Attempted sit > stand from EOB with pt unable to stand from approx home bed height.  Therapist elevated bed and still provided max assist for sit > stand due to weakness and decreased endurance.  Pt ambulated ~8 feet to w/c with RW and min assist.  Pt completed grooming tasks without assistance while seated at sink.  Setup assist for bathing and UB dressing.  Pt utilized reacher to [employer] and completed LB dressing with max assist (lifting) for sit > stand and min assist for standing balance while pt pulled shorts over hips.  O2 sats dropping to 84% on 4L during activity, had been on 4L per respiratory over night - notified RN who suggested changing it back down to 2L if pt tolerates.    Pt with increased work of breathing throughout session, requiring increased assist for transfers and sit > stand.  Notified SWK and PA of concerns.  2) Treatment session with focus on d/c planning with pt, SWK, pt's wife and daughter.  Engaged in lengthy discussion regarding recommended DME to increase pt independence and decrease burden of care on wife.  Pt initially hesitant to hospital bed, but ultimately agreeable.  Discussed toilet transfers with RW when able and use of slide board when requiring increased assistance.  Educated pt's wife on proper placement for slide board.  Pt  completed transfer w/c > bed with slide board with setup assist only.  Engaged in sit > stand from elevated EOB with mod assist, pt still requiring lifting assistance but much less than this AM.  Pt reports fatigue and requests to lie down in bed, required assist to lift BLE into bed. Pt's wife expressing understanding of modifications with mobility and self-care tasks based on pt level of fatigue and that it may fluctuate.  Pt expressing desire to return home and wife is supportive.  Therapy Documentation Precautions:  Precautions Precautions: Fall, Back, Cervical Restrictions Weight Bearing Restrictions: No General:   Vital Signs: Therapy Vitals Temp: 98.8 F (37.1 C) Temp Source: Oral Pulse Rate: 64 Resp: 16 BP: 137/61 Patient Position (if appropriate): Lying Oxygen Therapy SpO2: 92 % O2 Flow Rate (L/min): 4 L/min Pain: Pain Assessment Pain Scale: 0-10 Pain Score: 0-No pain  See Function Navigator for Current Functional Status.   Therapy/Group: Individual Therapy  [name] 05/11/2018, 9:56 AM

## 2018-05-11 NOTE — Progress Notes (Signed)
Travis [name] PAC was consulted about family concerns on regard pt.'s behavior,as per them pt. Have been acting confused and different today.They had a request for a urine test because they have concerns that the pt. May be has a UTI.After assessing the pt. PA was called.Pt. Verbalized he's feeling fine,that he's just a little tired.Orders were received for the UA test to be done.Keep monitoring pt. And assessing his needs.

## 2018-05-11 NOTE — Discharge Instructions (Signed)
Inpatient Rehab Discharge Instructions  NAJEH CREDIT Discharge date and time: No discharge date for patient encounter.   Activities/Precautions/ Functional Status: Activity: activity as tolerated Diet: diabetic diet Wound Care: keep wound clean and dry Functional status:  ___ No restrictions     ___ Walk up steps independently ___ 24/7 supervision/assistance   ___ Walk up steps with assistance ___ Intermittent supervision/assistance  ___ Bathe/dress independently ___ Walk with walker     _x__ Bathe/dress with assistance ___ Walk Independently    ___ Shower independently ___ Walk with assistance    ___ Shower with assistance ___ No alcohol     ___ Return to work/school ________    COMMUNITY REFERRALS UPON DISCHARGE:    Home Health:   PT     OT     RN                       Agency: [facility]    Phone:  [phone]   Medical Equipment/Items Ordered:  Wheelchair, cushion, commode, hospital bed and oxygen                                                      Agency/Supplier:  [employer] @ [phone]       Special Instructions: No driving  Moisturize bilateral lower extremities with euerin cream then cover on left pretibial area with foam dressing change weekly  Continue CPAP at home  2 L oxygen while ambulating and use as needed while at rest   My questions have been answered and I understand these instructions. I will adhere to these goals and the provided educational materials after my discharge from the hospital.  Patient/Caregiver Signature _______________________________ Date __________  Clinician Signature _______________________________________ Date __________  Please bring this form and your medication list with you to all your follow-up doctor's appointments.

## 2018-05-11 NOTE — Progress Notes (Signed)
SATURATION QUALIFICATIONS: (This note is used to comply with regulatory documentation for home oxygen)  Patient Saturations on Room Air at Rest = 79%  Patient Saturations on Room Air while Ambulating = 70%  Patient Saturations on 2 Liters of oxygen while Ambulating = 81%  Please briefly explain why patient needs home oxygen:COPD

## 2018-05-11 NOTE — Progress Notes (Signed)
Physical Therapy Discharge Summary  Patient Details  Name: Travis [name] MRN: [id number] Date of Birth: June 16, 1946  Today's Date: 05/12/2018      Patient has met 8 of 9 long term goals due to improved activity tolerance, improved balance, increased strength, improved awareness and improved coordination.  Patient to discharge at an ambulatory level [place].   Patient's care partner is independent to provide the necessary physical assistance at discharge. Pt continues to have periods requiring fluctuating assist from min to maxA pending fatigue. Have provided pt edu regarding transfers and pt will rent hospital bed upon d/c. Pt wil d/c home with SB and w/c and will initially perform transfers with SB.  Reasons goals not met: Pt continues to require assist for bed mobility, pt will now d/c home with hospital bed to be able to use bed features to facilitate mobility.   Recommendation:  Patient will benefit from ongoing skilled PT services in home health setting to continue to advance safe functional mobility, address ongoing impairments in balance, strength, endurance, safety, ambulation, and minimize fall risk.  Equipment: WC and Slide board  Reasons for discharge: treatment goals met  Patient/family agrees with progress made and goals achieved: Yes  PT Discharge Precautions/Restrictions   Vital Signs Therapy Vitals Temp: (!) 97.5 F (36.4 C) Pulse Rate: (!) 45 BP: 119/67 Oxygen Therapy SpO2: 96 % O2 Flow Rate (L/min): 2 L/min Motor  Motor Motor: Within Functional Limits  Mobility Bed Mobility Supine to Sit: 4: Min assist(per previous notes with use of features) Sit to Supine: 3: Mod assist(per notes) Transfers Transfers: Yes Sit to Stand: 3: Mod assist;4: Min assist(fluxuates per fatigue level) Locomotion  Ambulation Ambulation: Yes Ambulation Distance (Feet): 90 Feet(per notes) Assistive device: Rolling walker Gait Gait velocity: decreased self selected speed   Trunk/Postural Assessment  Cervical Assessment Cervical Assessment: Exceptions to [facility] Thoracic Assessment Thoracic Assessment: Exceptions to WFL(kyphotic) Lumbar Assessment Lumbar Assessment: Within Functional Limits Postural Control Postural Control: Deficits on evaluation  Balance Balance Balance Assessed: Yes Static Sitting Balance Static Sitting - Balance Support: Bilateral upper extremity supported;Feet supported Static Sitting - Level of Assistance: 5: Stand by assistance Dynamic Sitting Balance Dynamic Sitting - Balance Support: Feet supported Dynamic Sitting - Level of Assistance: 5: Stand by assistance Static Standing Balance Static Standing - Balance Support: During functional activity Static Standing - Level of Assistance: 5: Stand by assistance Extremity Assessment      RLE Assessment RLE Assessment: Exceptions to [facility] RLE Strength Right Hip Flexion: 4-/5 Right Knee Flexion: 4/5 Right Knee Extension: 4/5 Right Ankle Dorsiflexion: 4-/5 LLE Assessment LLE Assessment: Exceptions to [facility] LLE Strength Left Hip Flexion: 4-/5 Left Knee Flexion: 4+/5 Left Knee Extension: 4+/5 Left Ankle Dorsiflexion: 4-/5   See Function Navigator for Current Functional Status.  Rosita DeChalus 05/12/2018, 4:28 PM

## 2018-05-11 NOTE — Discharge Summary (Signed)
Discharge summary job [id number]

## 2018-05-11 NOTE — Discharge Summary (Signed)
NAME: OTHA, MONICAL MEDICAL RECORD [id number] ACCOUNT [id number] DATE OF BIRTH:1946-01-18 FACILITY: [facility] LOCATION: [facility] PHYSICIAN:ANKIT PATEL, MD  DISCHARGE SUMMARY  DATE OF DISCHARGE:  05/12/2018  DISCHARGE DIAGNOSES: 1.  Cervical and lumbar myelopathy, status post decompression. 2.  Sequential compression devices for deep venous thrombosis prophylaxis.   3.  Pain management.   4.  Depression.   5.  Obstructive sleep apnea with acute hypercarbic respiratory failure.   6.  Acute on chronic diastolic congestive heart failure.   7.  Acute on chronic renal failure.   8.  Morbid obesity.   9.  Hypoxic encephalopathy.   10.  Hypertension.   11.  Benign prostatic hypertrophy. 12.  Diabetes mellitus.   13.  Peripheral neuropathy.   14.  Hypothyroidism.   15.  Constipation.   16.  Hypokalemia, resolved.   17.  Acute blood loss anemia.   18.  Pseudomonas and Klebsiella urinary tract infection.  HISTORY OF PRESENT ILLNESS:  This is a 72 year old right-handed male with history of COPD, chronic kidney disease, obstructive sleep apnea, diabetes mellitus, combined systolic congestive heart failure and cervical myelopathy with tetraplegia with  neuropathy, lumbar decompression surgery last fall receiving inpatient rehabilitation services 10/17/2017.  The patient began declining early January with paraparesis progressing to paraparesis and inability to walk or perform ADLs due to severe  recurrent lumbar stenosis L4-5 and thoracic spinal stenosis at T1-T2.  He was admitted 04/08/2018 for decompression lumbar laminectomy with arthrodesis L4-L5 and partial C7-T1 laminectomy per  Dr. Sherley Bounds.  Postoperatively, showed improvement in bilateral lower extremity strength as well as ability to activate quads.  He became obtunded 04/09/2018 due to acute on chronic hypercarbic respiratory failure with encephalopathy requiring BiPAP  and transferred to step-down and followup per critical care  medicine.  Chest x-ray showed atelectasis, possible consolidation, was treated with diuresis.  Recent echocardiogram showed ejection fraction of 70%, grade II diastolic dysfunction.  He  developed fevers started on intravenous antibiotics with vancomycin and Zosyn, transitioned to [place] with course completed.  Vancomycin discontinued due to increasing renal function.  Follow up wound care nurse for bilateral lower extremity ischemic  changes with Unna boot and dressing changes as directed.  Acute on chronic anemia and followup.   Physical and occupational therapy evaluations completed, the patient was admitted for comprehensive rehabilitation program.  PAST MEDICAL HISTORY:  See discharge diagnoses.  SOCIAL HISTORY:  The patient with declining level of function with ADLs since January.  Living with his wife.  FUNCTIONAL STATUS:  Upon admission to rehab services and was max assist, ambulate 1 foot rolling walker, max assist stand pivot transfers, max/total assist with activities of daily living.  PHYSICAL EXAMINATION: VITAL SIGNS:  Blood pressure 148/74, pulse 72, temperature 99, respirations 22. GENERAL:  Alert male, in no acute distress. EOMs intact. NECK:  Supple, nontender, no JVD. CARDIOVASCULAR:  Rate controlled. ABDOMEN:  Soft, nontender, good bowel sounds. LUNGS:  Fair inspiratory effort, clear to auscultation.   SKIN:  Vascular changes with compression dressings bilateral lower extremities  REHABILITATION HOSPITAL COURSE:  The patient was admitted to inpatient rehabilitation services.  Therapies initiated on a 3-hour daily basis, consisting of physical therapy, occupational therapy and rehabilitation nursing.    The following issues were  addressed during patient's rehabilitation stay:    Pertaining to the patient's cervical and lumbar myelopathy, he had undergone decompression.  He would follow up with neurosurgery.  SCDs for DVT prophylaxis.  Vascular studies were negative.  Wound  care  lower extremities as dictated.  Pain management  with the use of Neurontin 400 mg 3 times daily.    Obstructive sleep apnea.  Acute hypercarbic respiratory failure, compounded by morbid obesity.  Slow titration of his oxygen that would be arranged for home.  He had been using BiPAP.  He exhibited no other signs of fluid overload.  He remained on  Demadex 80 mg twice daily as well as hydralazine 50 mg every 8 hours.     Acute on chronic renal failure.  Latest creatinine 11.71.  Followed closely.    Morbid obesity, BMI of 40.46 with dietary followup.    Blood pressure is overall controlled.  He did have a noted history of BPH.  He remained on Flomax as well as Toviaz.    Diabetes mellitus, peripheral neuropathy.  Hemoglobin A1c of 7.7.  His Amaryl was titrated to 2 mg daily.  He would follow up with his primary MD.    Acute blood loss anemia 9.4 and stable.  No bleeding episodes.    Noted Pseudomonas, Klebsiella UTI; initially on Keflex changed to cefepime.  Later changed to Zosyn on 04/30/2018 due to multidrug resistance and Zosyn has since been completed,  remaining afebrile.  Follow-up urinalysis study 05/11/2016 negative  The patient received weekly collaborative interdisciplinary team conferences to discuss estimated length of stay, family teaching, any barriers to discharge.  He propelled his wheelchair to the rehab gymnasium supervision, requiring rest breaks working  with energy conservation techniques, monitoring oxygen saturations.  Participated in the stand pivot transfers requiring max assist for sit to stand transfers and minimal guard for stand pivot.  Activities of daily living and home making, treatment  sessions focused on ADL retraining, functional transfer sit to stand, dynamic standing balance, and lower body dressing.  Completed sit to stand from elevated edge of bed with rolling walker and minimal assist.  Ambulates to bedside commode with minimal  assist and rolling  walker.  Able to doff his pants in preparation to toileting.  Minimal assist.  Sit to stand with increased time and again working with energy conservation techniques.  Full family teaching was completed.  Discussed at length with  family the physical assistance needed for patient to be discharged to home.  Teaching completed and planned for 05/12/2018.  DISCHARGE MEDICATIONS:  Included Lexapro 10 mg p.o. daily, Toviaz 8 mg p.o. daily, Neurontin 400 mg p.o. t.i.d., Amaryl 2 mg at breakfast, hydralazine 50 mg p.o. every 8 hours, Eucerin cream to lower extremities as directed, Synthroid 175 mcg p.o. daily,  Lopressor 50 mg p.o. b.i.d., MiraLax daily, hold for loose stool, potassium chloride 20 mEq p.o. b.i.d., Mirapex 0.5 mg p.o. b.i.d., Senokot twice daily, Flomax 0.4 mg p.o. daily, Demadex 80 mg p.o. b.i.d.    DIET:  His diet was a diabetic diet.    FOLLOWUP:  The patient would follow up with Dr. Delice Lesch at the outpatient rehabilitation service office as directed.  Dr. Sherley Bounds, neurosurgery - call for appointment; Dr. Mertie Moores, cardiology services.  Dr. [name], medical  management as well as outpatient follow-up with pulmonary services.  SPECIAL INSTRUCTIONS: Home oxygen arranged as well as patient will continue CPAP.     Change weekly with Unna boots.      AN/NUANCE D:05/11/2018 T:05/11/2018 JOB:[id number]

## 2018-05-11 NOTE — Progress Notes (Signed)
Subjective/Complaints: Patient seen lying in bed this morning. He states he slept well overnight. He has questions about his infection. He is looking forward to discharge tomorrow. Discussed with nursing desats overnight.  ROS: Denies CP, SOB, N/V/D  Objective: Vital Signs: Blood pressure 137/61, pulse 64, temperature 98.8 F (37.1 C), temperature source Oral, resp. rate 16, height 6' 1" (1.854 m), weight 126.2 kg (278 lb 3.5 oz), SpO2 92 %. No results found. Results for orders placed or performed during the hospital encounter of 04/16/18 (from the past 72 hour(s))  Glucose, capillary     Status: Abnormal   Collection Time: 05/08/18 11:17 AM  Result Value Ref Range   Glucose-Capillary 163 (H) 65 - 99 mg/dL  Glucose, capillary     Status: Abnormal   Collection Time: 05/08/18  4:48 PM  Result Value Ref Range   Glucose-Capillary 127 (H) 65 - 99 mg/dL   Comment 1 Notify RN   Glucose, capillary     Status: Abnormal   Collection Time: 05/08/18  8:29 PM  Result Value Ref Range   Glucose-Capillary 180 (H) 65 - 99 mg/dL  Glucose, capillary     Status: Abnormal   Collection Time: 05/09/18  6:38 AM  Result Value Ref Range   Glucose-Capillary 144 (H) 65 - 99 mg/dL  Glucose, capillary     Status: Abnormal   Collection Time: 05/09/18 11:59 AM  Result Value Ref Range   Glucose-Capillary 164 (H) 65 - 99 mg/dL  Glucose, capillary     Status: Abnormal   Collection Time: 05/09/18  4:27 PM  Result Value Ref Range   Glucose-Capillary 180 (H) 65 - 99 mg/dL  Glucose, capillary     Status: Abnormal   Collection Time: 05/09/18  9:27 PM  Result Value Ref Range   Glucose-Capillary 148 (H) 65 - 99 mg/dL   Comment 1 Notify RN   Glucose, capillary     Status: Abnormal   Collection Time: 05/10/18  6:15 AM  Result Value Ref Range   Glucose-Capillary 130 (H) 65 - 99 mg/dL  Basic metabolic panel     Status: Abnormal   Collection Time: 05/10/18  8:08 AM  Result Value Ref Range   Sodium 144 135 - 145  mmol/L   Potassium 3.6 3.5 - 5.1 mmol/L   Chloride 97 (L) 101 - 111 mmol/L   CO2 38 (H) 22 - 32 mmol/L   Glucose, Bld 149 (H) 65 - 99 mg/dL   BUN 38 (H) 6 - 20 mg/dL   Creatinine, Ser 1.63 (H) 0.61 - 1.24 mg/dL   Calcium 9.1 8.9 - 10.3 mg/dL   GFR calc non Af Amer 40 (L) >60 mL/min   GFR calc Af Amer 47 (L) >60 mL/min    Comment: (NOTE) The eGFR has been calculated using the CKD EPI equation. This calculation has not been validated in all clinical situations. eGFR's persistently <60 mL/min signify possible Chronic Kidney Disease.    Anion gap 9 5 - 15    Comment: Performed at [facility] [address] [place], [place] [address]  CBC with Differential/Platelet     Status: Abnormal   Collection Time: 05/10/18  8:08 AM  Result Value Ref Range   WBC 5.5 4.0 - 10.5 K/uL   RBC 3.51 (L) 4.22 - 5.81 MIL/uL   Hemoglobin 9.9 (L) 13.0 - 17.0 g/dL   HCT 33.6 (L) 39.0 - 52.0 %   MCV 95.7 78.0 - 100.0 fL   MCH 28.2 26.0 -  34.0 pg   MCHC 29.5 (L) 30.0 - 36.0 g/dL   RDW 16.4 (H) 11.5 - 15.5 %   Platelets 139 (L) 150 - 400 K/uL   Neutrophils Relative % 72 %   Neutro Abs 4.0 1.7 - 7.7 K/uL   Lymphocytes Relative 19 %   Lymphs Abs 1.1 0.7 - 4.0 K/uL   Monocytes Relative 6 %   Monocytes Absolute 0.3 0.1 - 1.0 K/uL   Eosinophils Relative 3 %   Eosinophils Absolute 0.2 0.0 - 0.7 K/uL   Basophils Relative 0 %   Basophils Absolute 0.0 0.0 - 0.1 K/uL    Comment: Performed at [facility] [address] [place] [address], [place], [place] [address]  Glucose, capillary     Status: Abnormal   Collection Time: 05/10/18 11:37 AM  Result Value Ref Range   Glucose-Capillary 180 (H) 65 - 99 mg/dL  Glucose, capillary     Status: Abnormal   Collection Time: 05/10/18  4:55 PM  Result Value Ref Range   Glucose-Capillary 127 (H) 65 - 99 mg/dL  Glucose, capillary     Status: Abnormal   Collection Time: 05/10/18  9:45 PM  Result Value Ref Range   Glucose-Capillary 143 (H) 65 - 99 mg/dL  Glucose,  capillary     Status: None   Collection Time: 05/11/18  6:36 AM  Result Value Ref Range   Glucose-Capillary 95 65 - 99 mg/dL    Constitutional: No distress . Vital signs reviewed. Obese. HENT: Normocephalic.  Atraumatic. Eyes: EOMI. No discharge. Cardiovascular: RRR. No JVD. Respiratory: CTA Bilaterally. Increased WOB. GI: BS +. Non-distended. Musc/Skel:  LE edema. No tenderness. Unna dressings Neuro: Alert and Oriented  Motor: 5/5 BUE proximal to distal BLE 3/5 HF, 4/5 distally (stable) Skin: Unna boots to bilateral lower extremities. Surgical incision with dressing C/D/I  Assessment/Plan: 1. 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 Physiatrist is providing close team supervision and 24 hour management of active medical problems listed below. Physiatrist and rehab team continue to assess barriers to discharge/monitor patient progress toward functional and medical goals. FIM: Function - Bathing Position: Wheelchair/chair at sink Body parts bathed by patient: Right arm, Left arm, Chest, Abdomen, Front perineal area, Right upper leg, Left upper leg Body parts bathed by helper: Buttocks, Back Bathing not applicable: Left lower leg, Right lower leg(Unna boots) Assist Level: Touching or steadying assistance(Pt > 75%)  Function- Upper Body Dressing/Undressing What is the patient wearing?: Pull over shirt/dress Pull over shirt/dress - Perfomed by patient: Thread/unthread right sleeve, Thread/unthread left sleeve, Put head through opening, Pull shirt over trunk Assist Level: Set up Function - Lower Body Dressing/Undressing What is the patient wearing?: Pants Position: Wheelchair/chair at sink Pants- Performed by patient: Thread/unthread right pants leg, Thread/unthread left pants leg, Pull pants up/down Pants- Performed by helper: Pull pants up/down Non-skid slipper socks-  Performed by helper: Don/doff right sock, Don/doff left sock TED Hose - Performed by helper: Don/doff right TED hose, Don/doff left TED hose Assist for footwear: Maximal assist Assist for lower body dressing: Assistive device, Touching or steadying assistance (Pt > 75%) Assistive Device Comment: reacher  Function - Toileting Toileting activity did not occur: No continent bowel/bladder event Toileting steps completed by patient: Adjust clothing prior to toileting, Adjust clothing after toileting Toileting steps completed by helper: Performs perineal hygiene Toileting Assistive Devices: Grab bar or rail Assist level: More than reasonable time  Function - Toilet  Transfers Toilet transfer activity did not occur: Safety/medical concerns Toilet transfer assistive device: Bedside commode, Walker Mechanical lift: Stedy Assist level to toilet: Maximal assist (Pt 25 - 49%/lift and lower) Assist level from toilet: Maximal assist (Pt 25 - 49%/lift and lower) Assist level to bedside commode (at bedside): Moderate assist (Pt 50 - 74%/lift or lower) Assist level from bedside commode (at bedside): Touching or steadying assistance (Pt > 75%)  Function - Chair/bed transfer Chair/bed transfer method: Stand pivot Chair/bed transfer assist level: Touching or steadying assistance (Pt > 75%) Chair/bed transfer assistive device: Walker Mechanical lift: Stedy Chair/bed transfer details: Verbal cues for safe use of DME/AE, Manual facilitation for placement, Manual facilitation for weight shifting, Verbal cues for precautions/safety  Function - Locomotion: Wheelchair Will patient use wheelchair at discharge?: Yes Type: Manual Max wheelchair distance: 149f Assist Level: Supervision or verbal cues Wheel 50 feet with 2 turns activity did not occur: Safety/medical concerns Assist Level: Supervision or verbal cues Wheel 150 feet activity did not occur: Safety/medical concerns Assist Level: Supervision or  verbal cues Turns around,maneuvers to table,bed, and toilet,negotiates 3% grade,maneuvers on rugs and over doorsills: No Function - Locomotion: Ambulation Ambulation activity did not occur: [name] Assistive device: Walker-rolling Max distance: 924fAssist level: Supervision or verbal cues Assist level: Supervision or verbal cues Assist level: Touching or steadying assistance (Pt > 75%) Walk 150 feet activity did not occur: Safety/medical concerns  Function - Comprehension Comprehension: Auditory Comprehension assist level: Follows complex conversation/direction with extra time/assistive device  Function - Expression Expression: Verbal Expression assist level: Expresses complex ideas: With extra time/assistive device  Function - Social Interaction Social Interaction assist level: Interacts appropriately 90% of the time - Needs monitoring or encouragement for participation or interaction.  Function - Problem Solving Problem solving assist level: Solves complex 90% of the time/cues < 10% of the time  Function - Memory Memory assist level: Recognizes or recalls 90% of the time/requires cueing < 10% of the time Patient normally able to recall (first 3 days only): Current season, Location of own room, Staff names and faces, That he or she is in a hospital  Medical Problem List and Plan: 1.  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  DVT Prophylaxis/Anticoagulation: SCDs.      Vascular study negative but with unna boots limited study 3. Pain Management: Neurontin 400 mg 3 times daily 4. Mood: Lexapro 10 mg daily 5. Neuropsych: This patient is capable of making decisions on his own behalf. 6. Skin/Wound Care: Pressure relief measures.    Keflex started on 5/1 for surgical wound drainage, changed to Zosyn 5/4-5/10 7. Fluids/Electrolytes/Nutrition: Strict I/Os. Heart healthy diet. Monitor voiding  function.  8. OSA with acute hypercarbic respiratory failure: Has to use BIPAP or CPAP at nights  Weaning supplemental oxygen as tolerated, will require discharge  CXR reviewed, suggesting atelectasis  Encourage IS and OOB  Continues to have desaturations, will discuss with parts regarding final recommendations 9. Acute on chronic diastolic CHF:  Improved with lasix which has been discontinued and changed to [facility] has also been stopped.  Hydralazine dose adjusted. Monitor weights daily with strict I/Os.       Filed Weights   05/09/18 0538 05/10/18 0500 05/11/18 0607  Weight: 127.8 kg (281 lb 12 oz) 129 kg (284 lb 6.3 oz) 126.2 kg (278 lb 3.5 oz)    ? Reliability  Managed by cards, appreciate recs 10 Acute on chronic  renal failure.   Hydralazine 50 mg 3 times daily   Hold ARB per Cards   Cr 1.71 on 5/10  Encourage appropriate fluid intake 11. Morbid obesity: BMI 40.46.  12. Hypoxic encephalopathy:   Resolved    limit sedating medications. Encourage IS. CPAP whenever asleep. 13.  Hypertension.  Lopressor 25 mg twice daily, increased to 50 BID on 5/3.    Labile, but overall controlled on 5/14  Monitor with increased mobility 14.  BPH.  Flomax 0.4 mg daily, Toviaz 8 mg daily.   15.  Diabetes mellitus with peripheral neuropathy.  Hemoglobin A1c 7.7.  SSI.  Check blood sugars before meals and at bedtime   On amaryl at home, resumed. Titrate regimen as needed   Increased amaryl to 15m on 5/11  Labile, but improving on 5/14 16.  Fevers.  Resolved. Blood cultures no growth.  Urine culture negative. Completed course of Omnicef 17.  Hypothyroidism.  Synthroid 18.  Constipation.  Laxative assistance, increased Senokot to 2 tablets twice a day, continue MiraLAX 1 packet/day   Dulcolax prn 19. Hypokalemia:   Potassium 3.6 on 5/13   Continue supplementation   Cont to monitor--follow up monday 20. Acute blood loss anemia  Hemoglobin 9.9 on 5/13  Continue to monitor 21. Acute lower  UTI  UA+, Ucx with greater than 100,000 Pseudomonas and greater than 40,000 Klebsiella, sensitivities reviewed  Keflex changed to cefepime , Changed to Zosyn on 5/3 due to multidrug resistances, zosyn completed 22. Thrombocytopenia  Platelets 139 on 5/13,? Related to Zosyn  Will require ambulatory monitoring  LOS (Days) 25 A FACE TO FACE EVALUATION WAS PERFORMED  Ankit ALorie Phenix5/14/2019, 7:39 AM

## 2018-05-11 NOTE — Plan of Care (Signed)
  Problem: RH SAFETY Goal: RH STG ADHERE TO SAFETY PRECAUTIONS W/ASSISTANCE/DEVICE Description STG Adhere to Safety Precautions With mod Assistance/Device.  Outcome: Progressing  Proper footwear, bed/chair alarm system

## 2018-05-11 NOTE — Progress Notes (Signed)
Occupational Therapy Session Note  Patient Details  Name: Travis [name] MRN: [id number] Date of Birth: Feb 29, 1946  Today's Date: 05/11/2018 OT Individual Time: 1000-1045 OT Individual Time Calculation (min): 45 min    Short Term Goals: Week 1:  OT Short Term Goal 1 (Week 1): Pt will maintain standing tolerance in stedy for20 minutes while performing grooming at sink OT Short Term Goal 1 - Progress (Week 1): Revised due to lack of progress OT Short Term Goal 2 (Week 1): Pt will perform diaphramitic breathing strategies with 1 questioning cue OT Short Term Goal 2 - Progress (Week 1): Met OT Short Term Goal 3 (Week 1): Pt will transfer from sit to stand from elevated surface with max assist to prepare for donning pants. OT Short Term Goal 3 - Progress (Week 1): Met OT Short Term Goal 4 (Week 1): Pt will verbalize 3/3 back precautions with no cues.   OT Short Term Goal 4 - Progress (Week 1): Met Week 2:  OT Short Term Goal 1 (Week 2): Pt will tolerate standing 5 mins in Stedy to complete 2 grooming tasks with increased endurance OT Short Term Goal 1 - Progress (Week 2): Partly met OT Short Term Goal 2 (Week 2): Pt will complete LB dressing with max assist with use of AE OT Short Term Goal 2 - Progress (Week 2): Progressing toward goal OT Short Term Goal 3 (Week 2): Pt will complete toilet transfer with mod assist with LRAD OT Short Term Goal 3 - Progress (Week 2): Met Week 3:  OT Short Term Goal 1 (Week 3): Pt will complete bathing with mod assist with use of AE  OT Short Term Goal 1 - Progress (Week 3): Met OT Short Term Goal 2 (Week 3): Pt will complete LB dressing with max assist with use of AE OT Short Term Goal 2 - Progress (Week 3): Met OT Short Term Goal 3 (Week 3): Pt will complete 1/3 toileting steps to decrease burden of care OT Short Term Goal 3 - Progress (Week 3): Met OT Short Term Goal 4 (Week 3): Pt will complete 1 grooming task in standing to increase standing  tolerance OT Short Term Goal 4 - Progress (Week 3): Progressing toward goal Week 4:  OT Short Term Goal 1 (Week 4): STG = LTGs due to remaining LOS  Skilled Therapeutic Interventions/Progress Updates:    Pt seen this session to facilitate transfers and initiate family education with daughter.  Pt received in room and agreeable to therapy. Pt on 2L of O2.  pt taken to gym to work on sit >< stand to a RW.  Attempted 4 times to have pt stand with focus on foot position, forward lean, using arms to push. Pt unable to lift hips more than a few inches and could not push up to stand.  Spoke with daughter about one option of purchasing a stedy lift.  Attempted to have pt stand with a standard Stedy as the bariatric wider one was not available at the time.  Pt unable to pull up and the hip pads were too narrow.  Pt stated he needed to toilet right away. Pt taken back to room and he used the urinal with A to hold urinal. Pt needed to have a BM.  With +2 A from NT for safety, pt used slide board to [facility] with mod A.  Pt used arm rails on BSC to push up so therapist could pull pants down.  Therapy session time over  so NT in room with pt.    Therapy Documentation Precautions:  Precautions Precautions: Fall, Back, Cervical Restrictions Weight Bearing Restrictions: No    Vital Signs: Therapy Vitals Temp: 98.8 F (37.1 C) Temp Source: Oral Pulse Rate: 64 Resp: 16 BP: 137/61 Patient Position (if appropriate): Lying Oxygen Therapy SpO2: 92 % O2 Flow Rate (L/min): 2 L/min Pain: Pain Assessment Pain Scale: 0-10 Pain Score: 0-No pain     ADL:      See Function Navigator for Current Functional Status.   Therapy/Group: Individual Therapy  [place] 05/11/2018, 8:27 AM

## 2018-05-11 NOTE — Progress Notes (Signed)
Occupational Therapy Discharge Summary  Patient Details  Name: Travis [name] MRN: [id number] Date of Birth: [birth year]  Patient has met 6 of 7 long term goals due to improved activity tolerance, improved balance, postural control and ability to compensate for deficits.  Patient to discharge at overall Min-mod assist level.  Min assist overall with mod assist for LB dressing and toileting tasks as pt requires assist to don socks/shoes and with hygiene post BM.  Patient's care partner is independent to provide the necessary physical assistance at discharge.    Reasons goals not met: Pt requires min-mod assist with toilet transfers depending on height of surface with sit > stand and level of fatigue.  Pt can typically stand from elevated toilet with min assist, but required mod assist when transferring to toilet.  Recommendation:  Patient will benefit from ongoing skilled OT services in home health setting to continue to advance functional skills in the area of BADL and Reduce care partner burden.  Equipment: hospital bed and wide drop arm BSC  Reasons for discharge: treatment goals met and discharge from hospital  Patient/family agrees with progress made and goals achieved: Yes  OT Discharge Precautions/Restrictions   Fall; Back precautions Vital Signs Therapy Vitals Temp: 97.8 F (36.6 C) Temp Source: Oral Pulse Rate: (!) 58 Resp: (!) 24 BP: (!) 150/63 Patient Position (if appropriate): Lying Oxygen Therapy SpO2: (!) 89 % O2 Device: Nasal Cannula Pain Pain Assessment Pain Score: 0-No pain   See Function Navigator for Current Functional Status.  [name] 05/11/2018, 4:06 PM

## 2018-05-12 ENCOUNTER — Ambulatory Visit: Payer: Medicare Other | Admitting: Cardiovascular Disease

## 2018-05-12 LAB — GLUCOSE, CAPILLARY
GLUCOSE-CAPILLARY: 114 mg/dL — AB (ref 65–99)
GLUCOSE-CAPILLARY: 159 mg/dL — AB (ref 65–99)

## 2018-05-12 MED ORDER — PRAMIPEXOLE DIHYDROCHLORIDE 0.5 MG PO TABS: 0.5000 mg | ORAL_TABLET | Freq: Two times a day (BID) | ORAL | 0 refills | Status: DC

## 2018-05-12 MED ORDER — METOPROLOL TARTRATE 50 MG PO TABS: 50.0000 mg | ORAL_TABLET | Freq: Two times a day (BID) | ORAL | 1 refills | Status: DC

## 2018-05-12 MED ORDER — SENNA 8.6 MG PO TABS: 2.0000 | ORAL_TABLET | Freq: Two times a day (BID) | ORAL | 0 refills | Status: DC

## 2018-05-12 MED ORDER — LEVOTHYROXINE SODIUM 175 MCG PO TABS: 175.0000 ug | ORAL_TABLET | Freq: Every day | ORAL | 2 refills | Status: AC

## 2018-05-12 MED ORDER — TAMSULOSIN HCL 0.4 MG PO CAPS: 0.4000 mg | ORAL_CAPSULE | Freq: Every day | ORAL | 0 refills | Status: AC

## 2018-05-12 MED ORDER — TOVIAZ 8 MG PO TB24: 8.0000 mg | ORAL_TABLET | Freq: Every day | ORAL | 11 refills | Status: AC

## 2018-05-12 MED ORDER — GLIMEPIRIDE 2 MG PO TABS: 2.0000 mg | ORAL_TABLET | Freq: Every day | ORAL | 1 refills | Status: DC

## 2018-05-12 MED ORDER — GABAPENTIN 400 MG PO CAPS
ORAL_CAPSULE | ORAL | 0 refills | Status: DC
Start: 2018-05-12 — End: 2019-06-10

## 2018-05-12 MED ORDER — TORSEMIDE 20 MG PO TABS: 80.0000 mg | ORAL_TABLET | Freq: Two times a day (BID) | ORAL | 1 refills | Status: DC

## 2018-05-12 MED ORDER — HYDRALAZINE HCL 50 MG PO TABS: 50.0000 mg | ORAL_TABLET | Freq: Three times a day (TID) | ORAL | 0 refills | Status: DC

## 2018-05-12 MED ORDER — POTASSIUM CHLORIDE CRYS ER 20 MEQ PO TBCR: 20.0000 meq | EXTENDED_RELEASE_TABLET | Freq: Two times a day (BID) | ORAL | 1 refills | Status: DC

## 2018-05-12 MED ORDER — ESCITALOPRAM OXALATE 10 MG PO TABS: 10.0000 mg | ORAL_TABLET | Freq: Every day | ORAL | 2 refills | Status: DC

## 2018-05-12 NOTE — Progress Notes (Signed)
Social Work  Discharge Note  The overall goal for the admission was met for:   Discharge location: Yes - home with wife and family to provide 24/7 assistance  Length of Stay: Yes - 26 days  Discharge activity level: Yes - moderate assistance overall  Home/community participation: Yes  Services provided included: MD, RD, PT, OT, RN, TR, Pharmacy and SW  Financial Services: [place] [employer]  Follow-up services arranged: Home Health: RN, PT, OT via [facility], DME: 20x18 lightweight w/c with ELRs, cushion, hospital bed, oxygen via [facility] and Patient/Family has no preference for HH/DME agencies  Comments (or additional information):  Pt and wife made aware that recommended w/c, cushion and wide drop arm commode are not covered by insurance as he got similar items less than 5 yrs prior.  Pt/ wife have opted to privately purchase w/c and cushion.  They have a wide commode (not drop arm) at home and will try this with [facility] and order a drop arm on their own if needed.  Patient/Family verbalized understanding of follow-up arrangements: Yes  Individual responsible for coordination of the follow-up plan: pt  Confirmed correct DME delivered: Myisha Pickerel 05/12/2018    Preston Garabedian

## 2018-05-12 NOTE — Progress Notes (Signed)
Orthopedic Tech Progress Note Patient Details:  Travis [name] 06-03-1946 [id number]  Ortho Devices Type of Ortho Device: Haematologist Ortho Device/Splint Location: bilateral Ortho Device/Splint Interventions: Application   Post Interventions Patient Tolerated: Well Instructions Provided: Care of device   [name] 05/12/2018, 9:44 AM

## 2018-05-12 NOTE — Progress Notes (Signed)
Pt. Got d/c orders,equipment,and follow up appointments and prescriptions.Pt. Is ready to go home with his family.

## 2018-05-12 NOTE — Progress Notes (Signed)
Subjective/Complaints: Patient seen He states he slept well overnight. He states he had a better night last night. He states he is ready for discharge.  ROS: denies CP, SOB, N/V/D  Objective: Vital Signs: Blood pressure (!) 132/54, pulse (!) 57, temperature 98.7 F (37.1 C), temperature source Oral, resp. rate 18, height 6' 1" (1.854 m), weight 125.4 kg (276 lb 7.3 oz), SpO2 91 %. No results found. Results for orders placed or performed during the hospital encounter of 04/16/18 (from the past 72 hour(s))  Glucose, capillary     Status: Abnormal   Collection Time: 05/09/18 11:59 AM  Result Value Ref Range   Glucose-Capillary 164 (H) 65 - 99 mg/dL  Glucose, capillary     Status: Abnormal   Collection Time: 05/09/18  4:27 PM  Result Value Ref Range   Glucose-Capillary 180 (H) 65 - 99 mg/dL  Glucose, capillary     Status: Abnormal   Collection Time: 05/09/18  9:27 PM  Result Value Ref Range   Glucose-Capillary 148 (H) 65 - 99 mg/dL   Comment 1 Notify RN   Glucose, capillary     Status: Abnormal   Collection Time: 05/10/18  6:15 AM  Result Value Ref Range   Glucose-Capillary 130 (H) 65 - 99 mg/dL  Basic metabolic panel     Status: Abnormal   Collection Time: 05/10/18  8:08 AM  Result Value Ref Range   Sodium 144 135 - 145 mmol/L   Potassium 3.6 3.5 - 5.1 mmol/L   Chloride 97 (L) 101 - 111 mmol/L   CO2 38 (H) 22 - 32 mmol/L   Glucose, Bld 149 (H) 65 - 99 mg/dL   BUN 38 (H) 6 - 20 mg/dL   Creatinine, Ser 1.63 (H) 0.61 - 1.24 mg/dL   Calcium 9.1 8.9 - 10.3 mg/dL   GFR calc non Af Amer 40 (L) >60 mL/min   GFR calc Af Amer 47 (L) >60 mL/min    Comment: (NOTE) The eGFR has been calculated using the CKD EPI equation. This calculation has not been validated in all clinical situations. eGFR's persistently <60 mL/min signify possible Chronic Kidney Disease.    Anion gap 9 5 - 15    Comment: Performed at [facility] [address], [place], [place] [address]  CBC with  Differential/Platelet     Status: Abnormal   Collection Time: 05/10/18  8:08 AM  Result Value Ref Range   WBC 5.5 4.0 - 10.5 K/uL   RBC 3.51 (L) 4.22 - 5.81 MIL/uL   Hemoglobin 9.9 (L) 13.0 - 17.0 g/dL   HCT 33.6 (L) 39.0 - 52.0 %   MCV 95.7 78.0 - 100.0 fL   MCH 28.2 26.0 - 34.0 pg   MCHC 29.5 (L) 30.0 - 36.0 g/dL   RDW 16.4 (H) 11.5 - 15.5 %   Platelets 139 (L) 150 - 400 K/uL   Neutrophils Relative % 72 %   Neutro Abs 4.0 1.7 - 7.7 K/uL   Lymphocytes Relative 19 %   Lymphs Abs 1.1 0.7 - 4.0 K/uL   Monocytes Relative 6 %   Monocytes Absolute 0.3 0.1 - 1.0 K/uL   Eosinophils Relative 3 %   Eosinophils Absolute 0.2 0.0 - 0.7 K/uL   Basophils Relative 0 %   Basophils Absolute 0.0 0.0 - 0.1 K/uL    Comment: Performed at [facility], [address], [place], [place] [address]  Glucose, capillary     Status: Abnormal   Collection  Time: 05/10/18 11:37 AM  Result Value Ref Range   Glucose-Capillary 180 (H) 65 - 99 mg/dL  Glucose, capillary     Status: Abnormal   Collection Time: 05/10/18  4:55 PM  Result Value Ref Range   Glucose-Capillary 127 (H) 65 - 99 mg/dL  Glucose, capillary     Status: Abnormal   Collection Time: 05/10/18  9:45 PM  Result Value Ref Range   Glucose-Capillary 143 (H) 65 - 99 mg/dL  Glucose, capillary     Status: None   Collection Time: 05/11/18  6:36 AM  Result Value Ref Range   Glucose-Capillary 95 65 - 99 mg/dL  Glucose, capillary     Status: Abnormal   Collection Time: 05/11/18 12:05 PM  Result Value Ref Range   Glucose-Capillary 172 (H) 65 - 99 mg/dL  Glucose, capillary     Status: Abnormal   Collection Time: 05/11/18  4:37 PM  Result Value Ref Range   Glucose-Capillary 178 (H) 65 - 99 mg/dL  Urinalysis, Routine w reflex microscopic     Status: Abnormal   Collection Time: 05/11/18  5:51 PM  Result Value Ref Range   Color, Urine YELLOW YELLOW   APPearance CLEAR CLEAR   Specific Gravity, Urine 1.011 1.005 - 1.030   pH 5.0 5.0 - 8.0    Glucose, UA NEGATIVE NEGATIVE mg/dL   Hgb urine dipstick NEGATIVE NEGATIVE   Bilirubin Urine NEGATIVE NEGATIVE   Ketones, ur NEGATIVE NEGATIVE mg/dL   Protein, ur NEGATIVE NEGATIVE mg/dL   Nitrite NEGATIVE NEGATIVE   Leukocytes, UA TRACE (A) NEGATIVE   RBC / HPF 0-5 0 - 5 RBC/hpf   WBC, UA 0-5 0 - 5 WBC/hpf   Bacteria, UA NONE SEEN NONE SEEN   Squamous Epithelial / LPF 0-5 0 - 5   Mucus PRESENT    Hyaline Casts, UA PRESENT     Comment: Performed at [facility], [address] [place], [place] [address]  Glucose, capillary     Status: Abnormal   Collection Time: 05/11/18  9:55 PM  Result Value Ref Range   Glucose-Capillary 156 (H) 65 - 99 mg/dL  Glucose, capillary     Status: Abnormal   Collection Time: 05/12/18  7:06 AM  Result Value Ref Range   Glucose-Capillary 114 (H) 65 - 99 mg/dL    Constitutional: No distress . Vital signs reviewed. Obese. HENT: Normocephalic.  Atraumatic. Eyes: EOMI. No discharge. Cardiovascular: RRR. No JVD. Respiratory: CTA Bilaterally. Mild increase in WOB. GI: BS +. Non-distended. Musc/Skel:  LE edema. No tenderness. Unna dressings Neuro: Alert and Oriented  Motor: 5/5 BUE proximal to distal BLE 3/5 HF, 4/5 distally (unchanged) Skin: Unna boots to bilateral lower extremities. Surgical incision with dressing C/D/I  Assessment/Plan: 1. 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 Physiatrist is providing close team supervision and 24 hour management of active medical problems listed below. Physiatrist and rehab team continue to assess barriers to discharge/monitor patient progress toward functional and medical goals. FIM: Function - Bathing Position: Wheelchair/chair at sink Body parts bathed by patient: Right arm, Left arm, Chest, Abdomen, Front perineal area, Right upper leg, Left upper leg Body parts bathed by helper: Buttocks,  Back Bathing not applicable: Left lower leg, Right lower leg(Unna boots) Assist Level: Touching or steadying assistance(Pt > 75%)  Function- Upper Body Dressing/Undressing What is the patient wearing?: Pull over shirt/dress Pull over shirt/dress - Perfomed by patient: Thread/unthread  right sleeve, Thread/unthread left sleeve, Put head through opening, Pull shirt over trunk Assist Level: Set up Function - Lower Body Dressing/Undressing What is the patient wearing?: Pants, Shoes Position: Wheelchair/chair at sink Pants- Performed by patient: Thread/unthread right pants leg, Thread/unthread left pants leg, Pull pants up/down Pants- Performed by helper: Pull pants up/down Non-skid slipper socks- Performed by helper: Don/doff right sock, Don/doff left sock Shoes - Performed by helper: Don/doff right shoe, Don/doff left shoe TED Hose - Performed by helper: Don/doff right TED hose, Don/doff left TED hose Assist for footwear: Maximal assist Assist for lower body dressing: Assistive device, Touching or steadying assistance (Pt > 75%)(Mod assist) Assistive Device Comment: reacher  Function - Toileting Toileting activity did not occur: No continent bowel/bladder event Toileting steps completed by patient: Adjust clothing prior to toileting, Adjust clothing after toileting Toileting steps completed by helper: Performs perineal hygiene Toileting Assistive Devices: Grab bar or rail Assist level: More than reasonable time  Function Midwife transfer activity did not occur: Safety/medical concerns Toilet transfer assistive device: Bedside commode, Environmental manager lift: Stedy Assist level to toilet: Maximal assist (Pt 25 - 49%/lift and lower) Assist level from toilet: Maximal assist (Pt 25 - 49%/lift and lower) Assist level to bedside commode (at bedside): Moderate assist (Pt 50 - 74%/lift or lower) Assist level from bedside commode (at bedside): Touching or steadying assistance  (Pt > 75%)  Function - Chair/bed transfer Chair/bed transfer method: Lateral scoot Chair/bed transfer assist level: Supervision or verbal cues Chair/bed transfer assistive device: Sliding board Mechanical lift: Stedy Chair/bed transfer details: Verbal cues for safe use of DME/AE, Manual facilitation for placement, Manual facilitation for weight shifting, Verbal cues for precautions/safety  Function - Locomotion: Wheelchair Will patient use wheelchair at discharge?: Yes Type: Manual Max wheelchair distance: 71f Assist Level: Supervision or verbal cues Wheel 50 feet with 2 turns activity did not occur: Safety/medical concerns Assist Level: Supervision or verbal cues Wheel 150 feet activity did not occur: Safety/medical concerns Assist Level: Supervision or verbal cues Turns around,maneuvers to table,bed, and toilet,negotiates 3% grade,maneuvers on rugs and over doorsills: No Function - Locomotion: Ambulation Ambulation activity did not occur: [name] Assistive device: Walker-rolling Max distance: 112fAssist level: Supervision or verbal cues Assist level: Supervision or verbal cues Walk 50 feet with 2 turns activity did not occur: Safety/medical concerns Assist level: Touching or steadying assistance (Pt > 75%) Walk 150 feet activity did not occur: Safety/medical concerns Walk 10 feet on uneven surfaces activity did not occur: Safety/medical concerns  Function - Comprehension Comprehension: Auditory Comprehension assist level: Understands basic 90% of the time/cues < 10% of the time  Function - Expression Expression: Verbal Expression assist level: Expresses complex ideas: With extra time/assistive device  Function - Social Interaction Social Interaction assist level: Interacts appropriately 90% of the time - Needs monitoring or encouragement for participation or interaction.  Function - Problem Solving Problem solving assist level: Solves basic problems with no  assist  Function - Memory Memory assist level: Recognizes or recalls 90% of the time/requires cueing < 10% of the time Patient normally able to recall (first 3 days only): Current season, Location of own room, Staff names and faces, That he or she is in a hospital  Medical Problem List and Plan: 1.  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  DVT Prophylaxis/Anticoagulation: SCDs.  Vascular study negative but with unna boots limited study 3. Pain Management: Neurontin 400 mg 3 times daily 4. Mood: Lexapro 10 mg daily 5. Neuropsych: This patient is capable of making decisions on his own behalf. 6. Skin/Wound Care: Pressure relief measures.    Keflex started on 5/1 for surgical wound drainage, changed to Zosyn 5/4-5/10 7. Fluids/Electrolytes/Nutrition: Strict I/Os. Heart healthy diet. Monitor voiding function.  8. OSA with acute hypercarbic respiratory failure: Has to use BIPAP or CPAP at nights  Weaning supplemental oxygen as tolerated, will require at discharge  CXR reviewed, suggesting atelectasis  Encourage IS and OOB  Discussed with cardiology and pulmonology. No new recommendations for either. Patient will need outpatient pulmonology follow-up. 9. Acute on chronic diastolic CHF:  Improved with lasix which has been discontinued and changed to [facility] which has also been stopped.  Hydralazine dose adjusted. Monitor weights daily with strict I/Os.       Filed Weights   05/10/18 0500 05/11/18 0607 05/12/18 0500  Weight: 129 kg (284 lb 6.3 oz) 126.2 kg (278 lb 3.5 oz) 125.4 kg (276 lb 7.3 oz)    ? Reliability  Managed by cards, appreciate recs 10 Acute on chronic renal failure.   Hydralazine 50 mg 3 times daily   Hold ARB per Cards   Cr 1.71 on 5/10  Encourage appropriate fluid intake 11. Morbid obesity: BMI 40.46.  12.  Hypoxic encephalopathy:   Resolved    limit sedating medications. Encourage IS. CPAP whenever asleep. 13.  Hypertension.  Lopressor 25 mg twice daily, increased to 50 BID on 5/3.    Overall controlled on 5/15  Monitor with increased mobility 14.  BPH.  Flomax 0.4 mg daily, Toviaz 8 mg daily.   15.  Diabetes mellitus with peripheral neuropathy.  Hemoglobin A1c 7.7.  SSI.  Check blood sugars before meals and at bedtime   On amaryl at home, resumed. Titrate regimen as needed   Increased amaryl to 29m on 5/11  Labile on 5/15 16.  Fevers.  Resolved. Blood cultures no growth.  Urine culture negative. Completed course of Omnicef 17.  Hypothyroidism.  Synthroid 18.  Constipation.  Laxative assistance, increased Senokot to 2 tablets twice a day, continue MiraLAX 1 packet/day   Dulcolax prn 19. Hypokalemia:   Potassium 3.6 on 5/13   Continue supplementation   Cont to monitor 20. Acute blood loss anemia  Hemoglobin 9.9 on 5/13  Continue to monitor 21. Acute lower UTI  UA+, Ucx with greater than 100,000 Pseudomonas and greater than 40,000 Klebsiella, sensitivities reviewed  Keflex changed to cefepime , Changed to Zosyn on 5/3 due to multidrug resistances, zosyn completed 22. Thrombocytopenia  Platelets 139 on 5/13,? Related to Zosyn  Will require ambulatory monitoring  LOS (Days) 26 A FACE TO FACE EVALUATION WAS PERFORMED   ALorie Phenix5/15/2019, 8:11 AM

## 2018-05-12 NOTE — Progress Notes (Signed)
Ortho tech was call as per [facility] request to follow up on unna boots change status,WTA nurse was consulted as well.Orders to proceeded with the change of the unna boots were received.

## 2018-05-12 NOTE — Progress Notes (Addendum)
Pt A/Ox4. No noted distress or pain. 2L N/C/ CPAP during night. 7a-7p family has only been involved with education only once. No family member was here during shift to educated. Pt slept well throughout the night. Pt assists with turning and mobility in the bed. Redness on groin area, administered barrier ointment. Pt uses urinal during the night, no noted s/s of infection (see flowsheet). Staff will continue to monitor and meet needs.

## 2018-05-12 NOTE — Progress Notes (Signed)
Ortho tech was contact as per MD. Ahmed Prima ask about unna boots change.RN was informed that the supplies still in back order.

## 2018-05-14 ENCOUNTER — Telehealth: Payer: Self-pay | Admitting: *Deleted

## 2018-05-14 ENCOUNTER — Telehealth: Payer: Self-pay

## 2018-05-14 NOTE — Telephone Encounter (Signed)
Transitional Care call-1st attempt to reach patient unsuccessful.  No answer. Left message to call our office.   Appointment Thursday 05/20/18 @10 :20 arrive by 10:00 to see Dr Posey Pronto [address]

## 2018-05-14 NOTE — Telephone Encounter (Signed)
Sherry from [facility] called to inform that pt was discharged from [facility] today and wanted to wait til Monday to start home health

## 2018-05-17 ENCOUNTER — Telehealth: Payer: Self-pay | Admitting: Nurse Practitioner

## 2018-05-17 ENCOUNTER — Telehealth: Payer: Self-pay

## 2018-05-17 NOTE — Telephone Encounter (Signed)
Preformed a transitional care call to patient this morning, was told of a couple of problems and issues that the patient is currently having problems with   1. Has Home Health been to the house and/or have they contacted you? (NO) a. If not, have you tried to contact them? (NO) b. Can we help you contact them? (YES-Informed patient that if home health does not contact him by the end of the day today to call us back and we will call them and see what is going on.)  2. Are bowels and bladder emptying properly? (NO-Stated is having problems with constipation)  The chief problem the patient is having is the constipation, he is wanting to know if there is anything can be done for him as it seems the miralax or senna is not working for him.

## 2018-05-17 NOTE — Telephone Encounter (Signed)
Pts wife called stating that since the pt had back surgery on 4/11 he hasn't been the same. Stating he has been sleeping more, slurring speech, and hasn't been remembering thing. [name] feels the pt "just isnt the same". Unsure if Dr. Jannifer Franklin will want pt to f/u sooner. Please call to advise

## 2018-05-17 NOTE — Telephone Encounter (Signed)
If he is taking Miralax already, he can increase it to twice a day.  He can also increase senna to 2 tabs at night.  Thanks.

## 2018-05-17 NOTE — Telephone Encounter (Signed)
Called [facility] to see what is the scheduling the have for patients visits from Home health, was told that the patient WAS called on the 17th to see if they can come by that day and was declined for visiting.  Was told they are going to try again today to see if they can go to patients location.

## 2018-05-17 NOTE — Telephone Encounter (Signed)
Transitional Care call  Patient name: Travis [name]) DOB: (1946-03-08) 1. Are you/is patient experiencing any problems since coming home? (NO) a. Are there any questions regarding any aspect of care? (NO) 2. Are there any questions regarding medications administration/dosing? (NO) a. Are meds being taken as prescribed? (YES) b. "Patient should review meds with caller to confirm"  3. Have there been any falls? (NO) 4. Has Home Health been to the house and/or have they contacted you? (NO) a. If not, have you tried to contact them? (NO) b. Can we help you contact them? (YES-Informed patient that if home health does not contact him by the end of the day today to call us back and we will call them and see what is going on.) 5. Are bowels and bladder emptying properly? (NO-Stated is having problems with constiptation) a. Are there any unexpected incontinence issues? (NO) b. If applicable, is patient following bowel/bladder programs? (NO) 6. Any fevers, problems with breathing, unexpected pain? (NO) 7. Are there any skin problems or new areas of breakdown? (NO) 8. Has the patient/family member arranged specialty MD follow up (ie cardiology/neurology/renal/surgical/etc.)?  (YES) a. Can we help arrange? (NO) 9. Does the patient need any other services or support that we can help arrange? (NO) 10. Are caregivers following through as expected in assisting the patient? (YES) Has the patient quit smoking, drinking alcohol, or using drugs as recommended? (NA)  Appointment Thursday 05/20/18 @10 :20 arrive by 10:00 to see Dr Posey Pronto [address]

## 2018-05-17 NOTE — Telephone Encounter (Signed)
I called the wife.  The patient has had some problems with breathing, had confusion in the hospital, reports of hypercapnia requiring BiPAP.  The patient is now on home oxygen.  He is having a waxing and waning mental status, at times he is good and other times more confused.  He is better than he was when he was in the hospital but not back to baseline.  He will be evaluated by his primary care doctor this 3 days from now, if no metabolic cause of his mental status changes noted, I will be happy to work this gentleman in quickly next week.

## 2018-05-17 NOTE — Telephone Encounter (Signed)
Spoke with wife, Travis [name] on [place] who stated that her husband was confused in the hospital after surgery, but they were told it was due to 5 hours of anesthesia and the medications. He went from hospital to rehab then home but has continued to be confused. He has an appointment with his PCP this Thursday. She stated he is on oxygen full time now as well. This RN advised that his PCP can evaluate him and refer him to Dr Jannifer Franklin for his new symptoms if PCP doesn't find cause or feels Dr Jannifer Franklin needs to see him before FU on 07/21/18. Also informed wife this RN will send this message to Dr Jannifer Franklin  for his review, and include his RN.  Advised wife that she will get a call back if Dr Jannifer Franklin has other recommendations. She verbalized understanding, appreciation.   Patient has upcoming appts with cardiology and rehab this week and pulmonary next week.

## 2018-05-18 ENCOUNTER — Encounter (INDEPENDENT_AMBULATORY_CARE_PROVIDER_SITE_OTHER): Payer: Self-pay

## 2018-05-18 ENCOUNTER — Ambulatory Visit: Payer: Medicare Other | Admitting: Cardiology

## 2018-05-18 ENCOUNTER — Encounter: Payer: Self-pay | Admitting: Cardiology

## 2018-05-18 VITALS — BP 142/62 | HR 55 | Ht 73.0 in | Wt 276.0 lb

## 2018-05-18 DIAGNOSIS — N183 Chronic kidney disease, stage 3 unspecified: Secondary | ICD-10-CM

## 2018-05-18 DIAGNOSIS — G4733 Obstructive sleep apnea (adult) (pediatric): Secondary | ICD-10-CM | POA: Diagnosis not present

## 2018-05-18 DIAGNOSIS — I5032 Chronic diastolic (congestive) heart failure: Secondary | ICD-10-CM | POA: Diagnosis not present

## 2018-05-18 DIAGNOSIS — I10 Essential (primary) hypertension: Secondary | ICD-10-CM

## 2018-05-18 DIAGNOSIS — J449 Chronic obstructive pulmonary disease, unspecified: Secondary | ICD-10-CM

## 2018-05-18 DIAGNOSIS — Z9989 Dependence on other enabling machines and devices: Secondary | ICD-10-CM

## 2018-05-18 NOTE — Patient Instructions (Signed)
Medication Instructions: Your physician recommends that you continue on your current medications as directed. Please refer to the Current Medication list given to you today.   Labwork: None Ordered  Procedures/Testing: None Ordered   Follow-Up: Your physician recommends that you schedule a follow-up appointment in: 6 weeks with Pecolia Ades NP or Dr.Nahser    Any Additional Special Instructions Will Be Listed Below (If Applicable).  PLEASE TRY AND ELEVATE YOUR LEGS AS MUCH AS POSSIBLE   DASH Eating Plan DASH stands for "Dietary Approaches to Stop Hypertension." The DASH eating plan is a healthy eating plan that has been shown to reduce high blood pressure (hypertension). It may also reduce your risk for type 2 diabetes, heart disease, and stroke. The DASH eating plan may also help with weight loss. What are tips for following this plan? General guidelines  Avoid eating more than 2,300 mg (milligrams) of salt (sodium) a day. If you have hypertension, you may need to reduce your sodium intake to 1,500 mg a day.  Limit alcohol intake to no more than 1 drink a day for nonpregnant women and 2 drinks a day for men. One drink equals 12 oz of beer, 5 oz of wine, or 1 oz of hard liquor.  Work with your health care provider to maintain a healthy body weight or to lose weight. Ask what an ideal weight is for you.  Get at least 30 minutes of exercise that causes your heart to beat faster (aerobic exercise) most days of the week. Activities may include walking, swimming, or biking.  Work with your health care provider or diet and nutrition specialist (dietitian) to adjust your eating plan to your individual calorie needs. Reading food labels  Check food labels for the amount of sodium per serving. Choose foods with less than 5 percent of the Daily Value of sodium. Generally, foods with less than 300 mg of sodium per serving fit into this eating plan.  To find whole grains, look for the word  "whole" as the first word in the ingredient list. Shopping  Buy products labeled as "low-sodium" or "no salt added."  Buy fresh foods. Avoid canned foods and premade or frozen meals. Cooking  Avoid adding salt when cooking. Use salt-free seasonings or herbs instead of table salt or sea salt. Check with your health care provider or pharmacist before using salt substitutes.  Do not fry foods. Cook foods using healthy methods such as baking, boiling, grilling, and broiling instead.  Cook with heart-healthy oils, such as olive, canola, soybean, or sunflower oil. Meal planning   Eat a balanced diet that includes: ? 5 or more servings of fruits and vegetables each day. At each meal, try to fill half of your plate with fruits and vegetables. ? Up to 6-8 servings of whole grains each day. ? Less than 6 oz of lean meat, poultry, or fish each day. A 3-oz serving of meat is about the same size as a deck of cards. One egg equals 1 oz. ? 2 servings of low-fat dairy each day. ? A serving of nuts, seeds, or beans 5 times each week. ? Heart-healthy fats. Healthy fats called Omega-3 fatty acids are found in foods such as flaxseeds and coldwater fish, like sardines, salmon, and mackerel.  Limit how much you eat of the following: ? Canned or prepackaged foods. ? Food that is high in trans fat, such as fried foods. ? Food that is high in saturated fat, such as fatty meat. ? Sweets, desserts,  sugary drinks, and other foods with added sugar. ? Full-fat dairy products.  Do not salt foods before eating.  Try to eat at least 2 vegetarian meals each week.  Eat more home-cooked food and less restaurant, buffet, and fast food.  When eating at a restaurant, ask that your food be prepared with less salt or no salt, if possible. What foods are recommended? The items listed may not be a complete list. Talk with your dietitian about what dietary choices are best for you. Grains Whole-grain or whole-wheat  bread. Whole-grain or whole-wheat pasta. Brown rice. [place] Morrow. Bulgur. Whole-grain and low-sodium cereals. Pita bread. Low-fat, low-sodium crackers. Whole-wheat flour tortillas. Vegetables Fresh or frozen vegetables (raw, steamed, roasted, or grilled). Low-sodium or reduced-sodium tomato and vegetable juice. Low-sodium or reduced-sodium tomato sauce and tomato paste. Low-sodium or reduced-sodium canned vegetables. Fruits All fresh, dried, or frozen fruit. Canned fruit in natural juice (without added sugar). Meat and other protein foods Skinless chicken or [place]. Ground chicken or [place]. Pork with fat trimmed off. Fish and seafood. Egg whites. Dried beans, peas, or lentils. Unsalted nuts, nut butters, and seeds. Unsalted canned beans. Lean cuts of beef with fat trimmed off. Low-sodium, lean deli meat. Dairy Low-fat (1%) or fat-free (skim) milk. Fat-free, low-fat, or reduced-fat cheeses. Nonfat, low-sodium ricotta or cottage cheese. Low-fat or nonfat yogurt. Low-fat, low-sodium cheese. Fats and oils Soft margarine without trans fats. Vegetable oil. Low-fat, reduced-fat, or light mayonnaise and salad dressings (reduced-sodium). Canola, safflower, olive, soybean, and sunflower oils. Avocado. Seasoning and other foods Herbs. Spices. Seasoning mixes without salt. Unsalted popcorn and pretzels. Fat-free sweets. What foods are not recommended? The items listed may not be a complete list. Talk with your dietitian about what dietary choices are best for you. Grains Baked goods made with fat, such as croissants, muffins, or some breads. Dry pasta or rice meal packs. Vegetables Creamed or fried vegetables. Vegetables in a cheese sauce. Regular canned vegetables (not low-sodium or reduced-sodium). Regular canned tomato sauce and paste (not low-sodium or reduced-sodium). Regular tomato and vegetable juice (not low-sodium or reduced-sodium). Angie Fava. Olives. Fruits Canned fruit in a light or heavy  syrup. Fried fruit. Fruit in cream or butter sauce. Meat and other protein foods Fatty cuts of meat. Ribs. Fried meat. Berniece Salines. Sausage. Bologna and other processed lunch meats. Salami. Fatback. Hotdogs. Bratwurst. Salted nuts and seeds. Canned beans with added salt. Canned or smoked fish. Whole eggs or egg yolks. Chicken or [place] with skin. Dairy Whole or 2% milk, cream, and half-and-half. Whole or full-fat cream cheese. Whole-fat or sweetened yogurt. Full-fat cheese. Nondairy creamers. Whipped toppings. Processed cheese and cheese spreads. Fats and oils Butter. Stick margarine. Lard. Shortening. Ghee. Bacon fat. Tropical oils, such as coconut, palm kernel, or palm oil. Seasoning and other foods Salted popcorn and pretzels. Onion salt, garlic salt, seasoned salt, table salt, and sea salt. Worcestershire sauce. Tartar sauce. Barbecue sauce. Teriyaki sauce. Soy sauce, including reduced-sodium. Steak sauce. Canned and packaged gravies. Fish sauce. Oyster sauce. Cocktail sauce. Horseradish that you find on the shelf. Ketchup. Mustard. Meat flavorings and tenderizers. Bouillon cubes. Hot sauce and Tabasco sauce. Premade or packaged marinades. Premade or packaged taco seasonings. Relishes. Regular salad dressings. Where to find more information:  National Heart, Lung, and [place]: https://wilson-eaton.com/  American Heart Association: www.heart.org Summary  The DASH eating plan is a healthy eating plan that has been shown to reduce high blood pressure (hypertension). It may also reduce your risk for type 2 diabetes, heart disease,  and stroke.  With the DASH eating plan, you should limit salt (sodium) intake to 2,300 mg a day. If you have hypertension, you may need to reduce your sodium intake to 1,500 mg a day.  When on the DASH eating plan, aim to eat more fresh fruits and vegetables, whole grains, lean proteins, low-fat dairy, and heart-healthy fats.  Work with your health care provider or diet and  nutrition specialist (dietitian) to adjust your eating plan to your individual calorie needs. This information is not intended to replace advice given to you by your health care provider. Make sure you discuss any questions you have with your health care provider. Document Released: 12/04/2011 Document Revised: 12/08/2016 Document Reviewed: 12/08/2016 Elsevier Interactive Patient Education  [employer].      If you need a refill on your cardiac medications before your next appointment, please call your pharmacy.

## 2018-05-18 NOTE — Progress Notes (Signed)
Cardiology Office Note:    Date:  05/18/2018   ID:  Rhyse, Loux 1946/07/07, MRN [id number]  PCP:  [name], MD  Cardiologist:  Mertie Moores, MD  Referring MD: [name], MD   Chief Complaint  Patient presents with  . Follow-up    CHF    History of Present Illness:    Travis [name] is a 72 y.o. male with a past medical history significant for hypertension, diastolic CHF, hypothyroidism, OSA on CPAP, poorly controlled type 2 diabetes, CKD, COPD, former smoker and DJD.  Cardiac catheterization in 2016 demonstrated moderate nonobstructive coronary artery disease and mild pulmonary hypertension.  The patient has had multiple hospital admissions for respiratory failure and CHF exacerbation. He was admitted to the hospital 04/08/18-04/15/18 for lumbar laminectomy. This was complicated by acute on chronic hypercarbic respiratory failure with encephalopathy requiring BiPAP and acute on chronic diastolic CHF. He also had acute on chronic renal failure and lower extremity ischemic changes. He was diuresed with improvement in respiratory function. He was discharged to inpatient rehabilitation. He was continued on oxygen therapy. He remained on Demadex 80 mg twice a day and hydralazine 50 mg every 8 hours. He exhibited no signs of fluid overload per dc summary. He was treated for UTI with antibiotics. He was discharged from rehabilitation on 05/11/18 with home oxygen.  He is here today for follow up with his wife and son. He states that he is feeling well. He denies chest discomfort or dyspnea at rest. He does have DOE with minimal exertion. He also has orthopnea and sleeps with HOB elevated, hospital bed, which has been present for about 6 months. He is in a wheelchair and using oxygen via [place]. His lower legs are wrapped with UNNA boots. He says that his leg ulcers have healed, but he still has a sore on his backside. He feels that he has only gotten mildly improved since his hospital  discharge.   After his back surgery he has regained some of the use of his legs. He is working with PT and is walking up to 50 feet with the walker and PT. He walks short distances with his family. Standing up is the hardest for him. No lightheadedness.   His demadex is down to 40 mg bid.    Past Medical History:  Diagnosis Date  . Cervical myelopathy (HCC)    with myelomalacia at C5-6 and C3-4  . CHF (congestive heart failure) ([place])   . Constipation - functional    controlled with miralax  . COPD (chronic obstructive pulmonary disease) (HCC)    mild  . DJD (degenerative joint disease)    WHOLE SPINE  . Dyspnea   . Gait disorder   . Heart murmur    years ago   . Hypertension   . Hypothyroidism    Low d/t treatment of hyperthyroidism  . 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 Peripheral neuropathy   . Pneumonia   . PONV (postoperative nausea and vomiting)   . RBBB   . Renal disorder    acute kidney failure  . Restless legs syndrome 03/14/2015  . RLS (restless legs syndrome)   . Sleep apnea    DX  2009/2010 study done at [facility]  . 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 Type II diabetes mellitus ([place])    diet controlled    Past Surgical History:  Procedure Laterality Date  . BACK SURGERY     6 surgeries  .  CARDIAC CATHETERIZATION [name] 09/07/2015   Procedure: Right/Left Heart Cath and Coronary Angiography;  Surgeon: Peter M Martinique, MD;  Location: [place] [facility];  Service: Cardiovascular;  Laterality: [name];  . CHOLECYSTECTOMY    . EYE SURGERY     BIL CATARACTS  . LUMBAR LAMINECTOMY/DECOMPRESSION MICRODISCECTOMY  07/14/2012   Procedure: LUMBAR LAMINECTOMY/DECOMPRESSION MICRODISCECTOMY 2 LEVELS;  Surgeon: Eustace Moore, MD;  Location: [place] [facility];  Service: Neurosurgery;  Laterality: Left;  Lumbar two three laminectomy, left thoracic four five transpedicular diskectomy  . POSTERIOR CERVICAL FUSION/FORAMINOTOMY  04/08/2018   Procedure: Laminectomy and Foraminotomy - Lumbar  Four-Lumbar Five, instrumented posterolateral fusion Lumbar Four- Five, Laminectomy and Foraminotomy - Thoracic One-Thoracic Two;  Surgeon: Eustace Moore, MD;  Location: [facility];  Service: Neurosurgery;;  posterior  . POSTERIOR LUMBAR FUSION  04/08/2018   Laminectomy and Foraminotomy - Lumbar Four-Lumbar Five, instrumented posterolateral fusion Lumbar Four- Five, Laminectomy and Foraminotomy - Thoracic One-Thoracic Two  . POSTERIOR LUMBAR FUSION  04/08/2018   Laminectomy and Foraminotomy - Lumbar Four-Lumbar Five, instrumented posterolateral fusion Lumbar Four- Five, Laminectomy and Foraminotomy - Thoracic One-Thoracic Two    Current Medications: Current Meds  Medication Sig  . aspirin EC 81 MG EC tablet Take 1 tablet (81 mg total) by mouth daily.  [name] escitalopram (LEXAPRO) 10 MG tablet Take 1 tablet (10 mg total) by mouth daily.  [name] gabapentin (NEURONTIN) 400 MG capsule TAKE 1 CAPSULE (400 MG TOTAL) BY MOUTH EVERY 8 (EIGHT) HOURS.  [name] glimepiride (AMARYL) 2 MG tablet Take 1 tablet (2 mg total) by mouth daily with breakfast.  . hydrALAZINE (APRESOLINE) 50 MG tablet Take 1 tablet (50 mg total) by mouth every 8 (eight) hours.  [name] levothyroxine (SYNTHROID, LEVOTHROID) 175 MCG tablet Take 1 tablet (175 mcg total) by mouth daily.  . metoprolol tartrate (LOPRESSOR) 50 MG tablet Take 1 tablet (50 mg total) by mouth 2 (two) times daily.  . polyethylene glycol (MIRALAX / GLYCOLAX) packet Take 17 g by mouth daily as needed. For constipation  . potassium chloride SA (K-DUR,KLOR-CON) 20 MEQ tablet Take 1 tablet (20 mEq total) by mouth 2 (two) times daily.  . pramipexole (MIRAPEX) 0.5 MG tablet Take 1 tablet (0.5 mg total) by mouth 2 (two) times daily.  [name] senna (SENOKOT) 8.6 MG TABS tablet Take 2 tablets (17.2 mg total) by mouth 2 (two) times daily.  . tamsulosin (FLOMAX) 0.4 MG CAPS capsule Take 1 capsule (0.4 mg total) by mouth daily.  [name] torsemide (DEMADEX) 20 MG tablet Take 40 mg by mouth 2 (two) times daily.   . TOVIAZ 8 MG TB24 tablet Take 1 tablet (8 mg total) by mouth daily.  . [DISCONTINUED] torsemide (DEMADEX) 20 MG tablet Take 4 tablets (80 mg total) by mouth 2 (two) times daily. (Patient taking differently: Take 40 mg by mouth 2 (two) times daily. )     Allergies:   Other and Levofloxacin in d5w   Social History   Socioeconomic History  . Marital status: Married    Spouse name: carolyn  . Number of children: 3  . Years of education: 32  . Highest education level: Not on file  Occupational History    Employer: [place]: Works in [place]  . Financial resource strain: Not on file  . Food insecurity:    Worry: Not on file    Inability: Not on file  . Transportation needs:    Medical: Not on file  Non-medical: Not on file  Tobacco Use  . Smoking status: Former Smoker    Packs/day: 2.00    Years: 20.00    Pack years: 40.00    Types: Cigarettes    Last attempt to quit: 06/28/1985    Years since quitting: 32.9  . Smokeless tobacco: Former Systems developer    Quit date: 06/28/1985  Substance and Sexual Activity  . Alcohol use: No  . Drug use: No  . Sexual activity: Not on file  Lifestyle  . Physical activity:    Days per week: Not on file    Minutes per session: Not on file  . Stress: Not on file  Relationships  . Social connections:    Talks on phone: Not on file    Gets together: Not on file    Attends religious service: Not on file    Active member of club or organization: Not on file    Attends meetings of clubs or organizations: Not on file    Relationship status: Not on file  Other Topics Concern  . Not on file  Social History Narrative   Lives with wife,  [name]   Patient is married with 3 children.   Patient has 16 yrs of education.   Patient is right handed.   Patient drinks 2 cups of caffeine per day.     Family History: The patient's family history includes CAD in his unknown relative; Diabetes in his father;  Heart disease in his father; Hypertension in his father; Polycythemia in his mother. ROS:   Please see the history of present illness.     All other systems reviewed and are negative.  EKGs/Labs/Other Studies Reviewed:    The following studies were reviewed today:  Echocardiogram 03/06/17 Mild LVH, EF 65-70, normal wall motion, grade 2 diastolic dysfunction, mild LAE  R/L cardiac catheterization 09/07/15 LM 40 LAD distal 40 LCx ostial 50 RCA proximal 40 EF 55-60 LVEDP 16, mean RA 4, PASP 45, mean PCWP 13  Echo 08/21/15 EF 55-60, normal wall motion, grade 2 diastolic dysfunction, mild LAE  Nuclear stress test 03/19/15 EF 48, no ischemia, low risk   EKG:  EKG is ordered today.  The ekg ordered today demonstrates sinus bradycardia, 52 bpm, RBBB. No change from previous except rate slower.   Recent Labs: 04/10/2018: TSH 1.163 04/11/2018: B Natriuretic Peptide 120.9 04/16/2018: ALT 15; Magnesium 2.4 05/10/2018: BUN 38; Creatinine, Ser 1.63; Hemoglobin 9.9; Platelets 139; Potassium 3.6; Sodium 144   Recent Lipid Panel    Component Value Date/Time   CHOL 124 08/20/2015 1458   TRIG 82 08/20/2015 1458   HDL 29 (L) 08/20/2015 1458   CHOLHDL 4.3 08/20/2015 1458   VLDL 16 08/20/2015 1458   LDLCALC 79 08/20/2015 1458    Physical Exam:    VS:  BP (!) 142/62   Pulse (!) 55   Ht 6\' 1"  (1.854 m)   Wt 276 lb (125.2 kg)   BMI 36.41 kg/m     Wt Readings from Last 3 Encounters:  05/18/18 276 lb (125.2 kg)  05/12/18 276 lb 7.3 oz (125.4 kg)  04/16/18 297 lb 13.5 oz (135.1 kg)     Physical Exam  Constitutional: He is oriented to person, place, and time. He appears well-developed and well-nourished. No distress.  HENT:  Head: Normocephalic and atraumatic.  Neck: Normal range of motion. Neck supple. No JVD present.  Cardiovascular: Normal rate, regular rhythm and normal heart sounds. Exam reveals no gallop and no friction rub.  No murmur heard. Pulmonary/Chest: Effort normal and  breath sounds normal. No respiratory distress. He has no wheezes. He has no rales.  Abdominal: Soft. Bowel sounds are normal.  Musculoskeletal: Normal range of motion. He exhibits edema.  Lower legs wrapped with Unna boots  Neurological: He is alert and oriented to person, place, and time.  Skin: Skin is warm and dry.  Psychiatric: He has a normal mood and affect. His behavior is normal. Thought content normal.  Vitals reviewed.    ASSESSMENT:    1. Chronic diastolic CHF (congestive heart failure) (HCC)   2. Stage 3 chronic kidney disease ([place])   3. Chronic obstructive pulmonary disease, unspecified COPD type ([place])   4. OSA on CPAP   5. Benign essential HTN    PLAN:    In order of problems listed above:  Chronic diastolic CHF: last echo in 02/2017 showed EF 65-70 percent with grade 2 diastolic dysfunction. Patient had acute on chronic diastolic CHF during hospitalization for back surgery in April. He was discharged to rehabilitation and volume status remained stable. Currently on Demadex 40 mg twice a day, metoprolol tartrate 50 mg twice a day, hydralazine 50 mg 3 times a day. His Wt is down from 297 in April to 276 lbs today. He still has lower extremity edema but appears volume stable. Labs on 5/13 were stable. He states that he limits sodium in his diet. He sits in a wheelchair much of the day. Advised to elevate legs as much as possible. He has a IT sales professional. Will continue current medical therapy.  Dr. Acie Fredrickson went in to see pt. Will continue all current medical therapy.  Stage III CKD: patient had acute on chronic renal failure during his hospitalization in April. Serum creatinine max was 2.74. His renal function improved to about his baseline with last serum creatinine 1.63 on 05/10/18.  COPD: Patient is now on home oxygen. He has a pulmonology appt next Tuesday.    Hypertension: BP is well controlled. Continue current medications.  OSA: Patient continues on CPAP, now with  oxygen   Medication Adjustments/Labs and Tests Ordered: Current medicines are reviewed at length with the patient today.  Concerns regarding medicines are outlined above. Labs and tests ordered and medication changes are outlined in the patient instructions below:  Patient Instructions  Medication Instructions: Your physician recommends that you continue on your current medications as directed. Please refer to the Current Medication list given to you today.   Labwork: None Ordered  Procedures/Testing: None Ordered   Follow-Up: Your physician recommends that you schedule a follow-up appointment in: 6 weeks with Pecolia Ades NP or Dr.Nahser    Any Additional Special Instructions Will Be Listed Below (If Applicable).  PLEASE TRY AND ELEVATE YOUR LEGS AS MUCH AS POSSIBLE   DASH Eating Plan DASH stands for "Dietary Approaches to Stop Hypertension." The DASH eating plan is a healthy eating plan that has been shown to reduce high blood pressure (hypertension). It may also reduce your risk for type 2 diabetes, heart disease, and stroke. The DASH eating plan may also help with weight loss. What are tips for following this plan? General guidelines  Avoid eating more than 2,300 mg (milligrams) of salt (sodium) a day. If you have hypertension, you may need to reduce your sodium intake to 1,500 mg a day.  Limit alcohol intake to no more than 1 drink a day for nonpregnant women and 2 drinks a day for men. One drink equals 12 oz of beer,  5 oz of wine, or 1 oz of hard liquor.  Work with your health care provider to maintain a healthy body weight or to lose weight. Ask what an ideal weight is for you.  Get at least 30 minutes of exercise that causes your heart to beat faster (aerobic exercise) most days of the week. Activities may include walking, swimming, or biking.  Work with your health care provider or diet and nutrition specialist (dietitian) to adjust your eating plan to your individual  calorie needs. Reading food labels  Check food labels for the amount of sodium per serving. Choose foods with less than 5 percent of the Daily Value of sodium. Generally, foods with less than 300 mg of sodium per serving fit into this eating plan.  To find whole grains, look for the word "whole" as the first word in the ingredient list. Shopping  Buy products labeled as "low-sodium" or "no salt added."  Buy fresh foods. Avoid canned foods and premade or frozen meals. Cooking  Avoid adding salt when cooking. Use salt-free seasonings or herbs instead of table salt or sea salt. Check with your health care provider or pharmacist before using salt substitutes.  Do not fry foods. Cook foods using healthy methods such as baking, boiling, grilling, and broiling instead.  Cook with heart-healthy oils, such as olive, canola, soybean, or sunflower oil. Meal planning   Eat a balanced diet that includes: ? 5 or more servings of fruits and vegetables each day. At each meal, try to fill half of your plate with fruits and vegetables. ? Up to 6-8 servings of whole grains each day. ? Less than 6 oz of lean meat, poultry, or fish each day. A 3-oz serving of meat is about the same size as a deck of cards. One egg equals 1 oz. ? 2 servings of low-fat dairy each day. ? A serving of nuts, seeds, or beans 5 times each week. ? Heart-healthy fats. Healthy fats called Omega-3 fatty acids are found in foods such as flaxseeds and coldwater fish, like sardines, salmon, and mackerel.  Limit how much you eat of the following: ? Canned or prepackaged foods. ? Food that is high in trans fat, such as fried foods. ? Food that is high in saturated fat, such as fatty meat. ? Sweets, desserts, sugary drinks, and other foods with added sugar. ? Full-fat dairy products.  Do not salt foods before eating.  Try to eat at least 2 vegetarian meals each week.  Eat more home-cooked food and less restaurant, buffet, and fast  food.  When eating at a restaurant, ask that your food be prepared with less salt or no salt, if possible. What foods are recommended? The items listed may not be a complete list. Talk with your dietitian about what dietary choices are best for you. Grains Whole-grain or whole-wheat bread. Whole-grain or whole-wheat pasta. Brown rice. [place]. Bulgur. Whole-grain and low-sodium cereals. Pita bread. Low-fat, low-sodium crackers. Whole-wheat flour tortillas. Vegetables Fresh or frozen vegetables (raw, steamed, roasted, or grilled). Low-sodium or reduced-sodium tomato and vegetable juice. Low-sodium or reduced-sodium tomato sauce and tomato paste. Low-sodium or reduced-sodium canned vegetables. Fruits All fresh, dried, or frozen fruit. Canned fruit in natural juice (without added sugar). Meat and other protein foods Skinless chicken or [place]. Ground chicken or [place]. Pork with fat trimmed off. Fish and seafood. Egg whites. Dried beans, peas, or lentils. Unsalted nuts, nut butters, and seeds. Unsalted canned beans. Lean cuts of beef with fat trimmed  off. Low-sodium, lean deli meat. Dairy Low-fat (1%) or fat-free (skim) milk. Fat-free, low-fat, or reduced-fat cheeses. Nonfat, low-sodium ricotta or cottage cheese. Low-fat or nonfat yogurt. Low-fat, low-sodium cheese. Fats and oils Soft margarine without trans fats. Vegetable oil. Low-fat, reduced-fat, or light mayonnaise and salad dressings (reduced-sodium). Canola, safflower, olive, soybean, and sunflower oils. Avocado. Seasoning and other foods Herbs. Spices. Seasoning mixes without salt. Unsalted popcorn and pretzels. Fat-free sweets. What foods are not recommended? The items listed may not be a complete list. Talk with your dietitian about what dietary choices are best for you. Grains Baked goods made with fat, such as croissants, muffins, or some breads. Dry pasta or rice meal packs. Vegetables Creamed or fried vegetables. Vegetables  in a cheese sauce. Regular canned vegetables (not low-sodium or reduced-sodium). Regular canned tomato sauce and paste (not low-sodium or reduced-sodium). Regular tomato and vegetable juice (not low-sodium or reduced-sodium). Angie Fava. Olives. Fruits Canned fruit in a light or heavy syrup. Fried fruit. Fruit in cream or butter sauce. Meat and other protein foods Fatty cuts of meat. Ribs. Fried meat. Berniece Salines. Sausage. Bologna and other processed lunch meats. Salami. Fatback. Hotdogs. Bratwurst. Salted nuts and seeds. Canned beans with added salt. Canned or smoked fish. Whole eggs or egg yolks. Chicken or [place] with skin. Dairy Whole or 2% milk, cream, and half-and-half. Whole or full-fat cream cheese. Whole-fat or sweetened yogurt. Full-fat cheese. Nondairy creamers. Whipped toppings. Processed cheese and cheese spreads. Fats and oils Butter. Stick margarine. Lard. Shortening. Ghee. Bacon fat. Tropical oils, such as coconut, palm kernel, or palm oil. Seasoning and other foods Salted popcorn and pretzels. Onion salt, garlic salt, seasoned salt, table salt, and sea salt. Worcestershire sauce. Tartar sauce. Barbecue sauce. Teriyaki sauce. Soy sauce, including reduced-sodium. Steak sauce. Canned and packaged gravies. Fish sauce. Oyster sauce. Cocktail sauce. Horseradish that you find on the shelf. Ketchup. Mustard. Meat flavorings and tenderizers. Bouillon cubes. Hot sauce and Tabasco sauce. Premade or packaged marinades. Premade or packaged taco seasonings. Relishes. Regular salad dressings. Where to find more information:  National Heart, Lung, and [place]: https://wilson-eaton.com/  American Heart Association: www.heart.org Summary  The DASH eating plan is a healthy eating plan that has been shown to reduce high blood pressure (hypertension). It may also reduce your risk for type 2 diabetes, heart disease, and stroke.  With the DASH eating plan, you should limit salt (sodium) intake to 2,300 mg a  day. If you have hypertension, you may need to reduce your sodium intake to 1,500 mg a day.  When on the DASH eating plan, aim to eat more fresh fruits and vegetables, whole grains, lean proteins, low-fat dairy, and heart-healthy fats.  Work with your health care provider or diet and nutrition specialist (dietitian) to adjust your eating plan to your individual calorie needs. This information is not intended to replace advice given to you by your health care provider. Make sure you discuss any questions you have with your health care provider. Document Released: 12/04/2011 Document Revised: 12/08/2016 Document Reviewed: 12/08/2016 Elsevier Interactive Patient Education  [employer].      If you need a refill on your cardiac medications before your next appointment, please call your pharmacy.      Signed, Daune Perch, NP  05/18/2018 10:17 AM    [place]

## 2018-05-20 ENCOUNTER — Encounter: Payer: Self-pay | Admitting: Physical Medicine & Rehabilitation

## 2018-05-20 ENCOUNTER — Encounter: Payer: Medicare Other | Attending: Physical Medicine & Rehabilitation | Admitting: Physical Medicine & Rehabilitation

## 2018-05-20 VITALS — BP 161/68 | HR 60 | Ht 73.0 in | Wt 280.0 lb

## 2018-05-20 DIAGNOSIS — I5032 Chronic diastolic (congestive) heart failure: Secondary | ICD-10-CM | POA: Diagnosis not present

## 2018-05-20 DIAGNOSIS — Z9049 Acquired absence of other specified parts of digestive tract: Secondary | ICD-10-CM | POA: Insufficient documentation

## 2018-05-20 DIAGNOSIS — K5900 Constipation, unspecified: Secondary | ICD-10-CM | POA: Diagnosis not present

## 2018-05-20 DIAGNOSIS — J9602 Acute respiratory failure with hypercapnia: Secondary | ICD-10-CM | POA: Insufficient documentation

## 2018-05-20 DIAGNOSIS — Z959 Presence of cardiac and vascular implant and graft, unspecified: Secondary | ICD-10-CM | POA: Insufficient documentation

## 2018-05-20 DIAGNOSIS — E1142 Type 2 diabetes mellitus with diabetic polyneuropathy: Secondary | ICD-10-CM | POA: Diagnosis not present

## 2018-05-20 DIAGNOSIS — Z9889 Other specified postprocedural states: Secondary | ICD-10-CM | POA: Diagnosis not present

## 2018-05-20 DIAGNOSIS — G8220 Paraplegia, unspecified: Secondary | ICD-10-CM | POA: Diagnosis not present

## 2018-05-20 DIAGNOSIS — Z9981 Dependence on supplemental oxygen: Secondary | ICD-10-CM | POA: Diagnosis not present

## 2018-05-20 DIAGNOSIS — E119 Type 2 diabetes mellitus without complications: Secondary | ICD-10-CM | POA: Diagnosis not present

## 2018-05-20 DIAGNOSIS — I5043 Acute on chronic combined systolic (congestive) and diastolic (congestive) heart failure: Secondary | ICD-10-CM | POA: Insufficient documentation

## 2018-05-20 DIAGNOSIS — R269 Unspecified abnormalities of gait and mobility: Secondary | ICD-10-CM | POA: Diagnosis not present

## 2018-05-20 DIAGNOSIS — Z981 Arthrodesis status: Secondary | ICD-10-CM | POA: Diagnosis not present

## 2018-05-20 DIAGNOSIS — J449 Chronic obstructive pulmonary disease, unspecified: Secondary | ICD-10-CM

## 2018-05-20 DIAGNOSIS — N179 Acute kidney failure, unspecified: Secondary | ICD-10-CM

## 2018-05-20 DIAGNOSIS — Z6836 Body mass index (BMI) 36.0-36.9, adult: Secondary | ICD-10-CM | POA: Diagnosis not present

## 2018-05-20 DIAGNOSIS — I10 Essential (primary) hypertension: Secondary | ICD-10-CM | POA: Diagnosis not present

## 2018-05-20 DIAGNOSIS — D696 Thrombocytopenia, unspecified: Secondary | ICD-10-CM | POA: Diagnosis not present

## 2018-05-20 DIAGNOSIS — G959 Disease of spinal cord, unspecified: Secondary | ICD-10-CM

## 2018-05-20 DIAGNOSIS — G4733 Obstructive sleep apnea (adult) (pediatric): Secondary | ICD-10-CM | POA: Diagnosis not present

## 2018-05-20 DIAGNOSIS — G2581 Restless legs syndrome: Secondary | ICD-10-CM | POA: Insufficient documentation

## 2018-05-20 DIAGNOSIS — M4714 Other spondylosis with myelopathy, thoracic region: Secondary | ICD-10-CM

## 2018-05-20 DIAGNOSIS — M4712 Other spondylosis with myelopathy, cervical region: Secondary | ICD-10-CM | POA: Diagnosis not present

## 2018-05-20 DIAGNOSIS — I130 Hypertensive heart and chronic kidney disease with heart failure and stage 1 through stage 4 chronic kidney disease, or unspecified chronic kidney disease: Secondary | ICD-10-CM | POA: Insufficient documentation

## 2018-05-20 NOTE — Progress Notes (Signed)
Subjective:    Patient ID: Travis [name], male    DOB: 01/09/1946, 72 y.o.   MRN: [id number]  HPI 72 year old right-handed male with history of COPD, chronic kidney disease, obstructive sleep apnea, diabetes mellitus, combined systolic congestive heart failure and cervical myelopathy with tetraplegia and neuropathy, lumbar decompression surgery last fall receiving inpatient rehabilitation services 10/17/2017 presents for transitional care management after receiving CIR for cervical/lumbar myelopathy s/p decompression.   DATE OF ADMISSION: 04/16/2018 DATE OF DISCHARGE:  05/12/2018   Wife present, who supplements history. At discharge, he was instructed to follow up with Neurosurg, he is not sure if he has an appointment. He saw Cards and sees PCP next week. He is on continuous O2 without problems, CPAP qhs. Unna boots are being changed.  Pain is controlled. Weights have been stable. BP is elevated, but states it is WNL at home. CBGs have been relatively controlled.  He notes constipation, but states it is improving.  Therapies: Has not started yet DME: Hospital bed Mobility: Wheelchair at all times.   Pain Inventory Average Pain 2 Pain Right Now 0 My pain is aching  In the last 24 hours, has pain interfered with the following? General activity 4 Relation with others 4 Enjoyment of life 4 What TIME of day is your pain at its worst? night Sleep (in general) Fair  Pain is worse with: bending and standing Pain improves with: heat/ice Relief from Meds: na  Mobility walk with assistance use a cane use a walker ability to climb steps?  no do you drive?  no use a wheelchair needs help with transfers  Function retired  Neuro/Psych bladder control problems bowel control problems weakness trouble walking depression anxiety  Prior Studies Any changes since last visit?  no  Physicians involved in your care Any changes since last visit?  no   Family History  Problem  Relation Age of Onset  . Polycythemia Mother   . Diabetes Father   . Hypertension Father   . Heart disease Father   . CAD Unknown        1 of his 4 brothers and father has CAD   Social History   Socioeconomic History  . Marital status: Married    Spouse name: carolyn  . Number of children: 3  . Years of education: 44  . Highest education level: Not on file  Occupational History    Employer: [employer]: Works in [place]  . Financial resource strain: Not on file  . Food insecurity:    Worry: Not on file    Inability: Not on file  . Transportation needs:    Medical: Not on file    Non-medical: Not on file  Tobacco Use  . Smoking status: Former Smoker    Packs/day: 2.00    Years: 20.00    Pack years: 40.00    Types: Cigarettes    Last attempt to quit: 06/28/1985    Years since quitting: 32.9  . Smokeless tobacco: Former Systems developer    Quit date: 06/28/1985  Substance and Sexual Activity  . Alcohol use: No  . Drug use: No  . Sexual activity: Not on file  Lifestyle  . Physical activity:    Days per week: Not on file    Minutes per session: Not on file  . Stress: Not on file  Relationships  . Social connections:    Talks on phone: Not on file    Gets  together: Not on file    Attends religious service: Not on file    Active member of club or organization: Not on file    Attends meetings of clubs or organizations: Not on file    Relationship status: Not on file  Other Topics Concern  . Not on file  Social History Narrative   Lives with wife,  [name]   Patient is married with 3 children.   Patient has 16 yrs of education.   Patient is right handed.   Patient drinks 2 cups of caffeine per day.   Past Surgical History:  Procedure Laterality Date  . BACK SURGERY     6 surgeries  . CARDIAC CATHETERIZATION [name] 09/07/2015   Procedure: Right/Left Heart Cath and Coronary Angiography;  Surgeon: Peter M Martinique, MD;  Location: [facility];  Service: Cardiovascular;  Laterality: [name];  . CHOLECYSTECTOMY    . EYE SURGERY     BIL CATARACTS  . LUMBAR LAMINECTOMY/DECOMPRESSION MICRODISCECTOMY  07/14/2012   Procedure: LUMBAR LAMINECTOMY/DECOMPRESSION MICRODISCECTOMY 2 LEVELS;  Surgeon: Eustace Moore, MD;  Location: [facility];  Service: Neurosurgery;  Laterality: Left;  Lumbar two three laminectomy, left thoracic four five transpedicular diskectomy  . POSTERIOR CERVICAL FUSION/FORAMINOTOMY  04/08/2018   Procedure: Laminectomy and Foraminotomy - Lumbar Four-Lumbar Five, instrumented posterolateral fusion Lumbar Four- Five, Laminectomy and Foraminotomy - Thoracic One-Thoracic Two;  Surgeon: Eustace Moore, MD;  Location: [facility];  Service: Neurosurgery;;  posterior  . POSTERIOR LUMBAR FUSION  04/08/2018   Laminectomy and Foraminotomy - Lumbar Four-Lumbar Five, instrumented posterolateral fusion Lumbar Four- Five, Laminectomy and Foraminotomy - Thoracic One-Thoracic Two  . POSTERIOR LUMBAR FUSION  04/08/2018   Laminectomy and Foraminotomy - Lumbar Four-Lumbar Five, instrumented posterolateral fusion Lumbar Four- Five, Laminectomy and Foraminotomy - Thoracic One-Thoracic Two   Past Medical History:  Diagnosis Date  . Cervical myelopathy (HCC)    with myelomalacia at C5-6 and C3-4  . CHF (congestive heart failure) ([place])   . Constipation - functional    controlled with miralax  . COPD (chronic obstructive pulmonary disease) (HCC)    mild  . DJD (degenerative joint disease)    WHOLE SPINE  . Dyspnea   . Gait disorder   . Heart murmur    years ago   . Hypertension   . Hypothyroidism    Low d/t treatment of hyperthyroidism  . 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 Peripheral neuropathy   . Pneumonia   . PONV (postoperative nausea and vomiting)   . RBBB   . Renal disorder    acute kidney failure  . Restless legs syndrome 03/14/2015  . RLS (restless legs syndrome)   . Sleep apnea    DX  2009/2010 [facility] at  [facility]  . 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 Type II diabetes mellitus (HCC)    diet controlled   BP (!) 161/68   Pulse 60   Ht 6\' 1"  (1.854 m)   Wt 280 lb (127 kg)   SpO2 91% Comment: 2ltrs O2 via Nasal Can  BMI 36.94 kg/m   Opioid Risk Score:   Fall Risk Score:  `1  Depression screen PHQ 2/9  No flowsheet data found.   Review of Systems  Constitutional: Negative.   HENT: Negative.   Eyes: Negative.   Respiratory: Positive for shortness of breath.   Cardiovascular: Positive for leg swelling.  Gastrointestinal: Negative.   Endocrine: Negative.   Genitourinary: Negative.   Musculoskeletal:  Positive for gait problem and joint swelling.  Skin: Negative.   Allergic/Immunologic: Negative.   Hematological: Bruises/bleeds easily.  Psychiatric/Behavioral: Positive for dysphoric mood. The patient is nervous/anxious.   All other systems reviewed and are negative.      Objective:   Physical Exam Constitutional: No distress . Vital signs reviewed. Obese. HENT: Normocephalic.  Atraumatic. Eyes: EOMI. No discharge. Cardiovascular: RRR. No JVD. Respiratory: CTA Bilaterally. Unlabored.  GI: BS +. Non-distended. Musc/Skel:  LE in unna boots. No tenderness. Neuro: Alert and Oriented  Motor: 5/5 BUE proximal to distal BLE 3-/5 HF, 4-/5 KE, 4+/5 ADF  Skin: Unna boots to bilateral lower extremities. Surgical site healed.    Assessment & Plan:  72 year old right-handed male with history of COPD, chronic kidney disease, obstructive sleep apnea, diabetes mellitus, combined systolic congestive heart failure and cervical myelopathy with tetraplegia and neuropathy, lumbar decompression surgery last fall receiving inpatient rehabilitation services 10/17/2017 presents for transitional care management after receiving CIR for cervical/lumbar myelopathy s/p decompression arthrodesis.   1. Decreased functional mobility/paraparesis secondary to cervical and lumbar myelopathy status post  decompression arthrodesis  Begin therapies  Needs follow up with Neurosurg  2. Pain Management:    Neurontin 400 mg 3 times daily per PCP  3. OSA with acute hypercarbic respiratory failure:   Cont CPAP at night  Cont supplemental O2  4. Acute on chronic diastolic CHF:    Weight stable per pt  Follow up with Cards  5 Acute on chronic renal failure.   Labs per PCP  6. Morbid obesity:   Encouraged weight loss  7.  Hypertension.    Cont to meds  Elevated today, however, per pt WNL at home  Follow up with PCP  8.  Diabetes mellitus with peripheral neuropathy.    Cont meds  Controlled at present, per pt  9.  Constipation.    Cont meds  Improving  10. Thrombocytopenia  Follow up labs per PCP  11. Gait abnormality  Cont wheelchair for safety  Begin therapies  Meds reviewed Referrals reviewed All questions answered

## 2018-05-21 ENCOUNTER — Telehealth: Payer: Self-pay

## 2018-05-21 NOTE — Telephone Encounter (Signed)
Travis [name], PT at [facility], [place]. Verbal orders given

## 2018-05-25 ENCOUNTER — Encounter: Payer: Self-pay | Admitting: Pulmonary Disease

## 2018-05-25 ENCOUNTER — Ambulatory Visit: Payer: Medicare Other | Admitting: Pulmonary Disease

## 2018-05-25 VITALS — BP 142/70 | HR 74 | Ht 73.0 in

## 2018-05-25 DIAGNOSIS — R0600 Dyspnea, unspecified: Secondary | ICD-10-CM

## 2018-05-25 NOTE — Assessment & Plan Note (Signed)
Continue to monitor weights daily Follow low-sodium diet Continue follow-up with cardiology in 6 weeks Notify cardiology if your weight is increasing or if you are noticing your lower extremity swelling is increasing Continue to elevate legs and use compression wraps

## 2018-05-25 NOTE — Assessment & Plan Note (Signed)
Continue to work towards healthy weight Slowly increasing activity

## 2018-05-25 NOTE — Progress Notes (Signed)
@Patient  ID: Travis [name], male    DOB: August 13, 1946, 72 y.o.   MRN: [id number]  Chief Complaint  Patient presents with  . Consult    back surgery, 2L continous.     Referring provider: [name], MD  HPI: 72 year old male patient. Followed for dyspnea (potential COPD - small airway disease).  Former smoker (quit 06/28/1985)-40 pack years.   PMH: cervical spondylosis, hypertension, diastolic heart failure, hypertension, stage III chronic kidney disease, OSA, peripheral neuropathy.  Recent [facility] Encounters:   10/30/2017-office visit-[facility] Patient reports for office visit today.  Was recently admitted in October 2018 for Citrobacter UTI acute on chronic diastolic heart failure and acute respiratory failure with hypoxia secondary to volume overload.  He responded to IV Lasix, oxygen, antibiotics.  With review of his pulmonary function test shows no overt obstruction, but does show there is a concavity in his flow loop and reduction in his mid flow suggests suggestive of small airway disease.  We will try him on combination of Labe lama with stiletto.  PFTs also noted restriction with a diffusion impairment.  There is no evidence of ILD on CT scan.  Encouraged to continue to work on weight loss and exercise.  01/12/2018-office visit-[facility] Follow-up for COPD.  No changes in plan of care.  Continue to use albuterol as needed.  Continue to work on weight loss.  Tests:  12/02/2017-Home sleep study- AHI 20.6, average oxygen saturation 84%, lowest oxygen desaturation 78% Moderate severe obstructive sleep apnea with hypoxemia  PFTs 10/07/17 FVC 2.19 [45%], FEV1 1.69 (47%], F/F 77, TLC 55%, DLCO 35% Obstruction with severe restriction and diffusion impairment.  Imaging:  Chest x-ray 10/06/06-right hemidiaphragm elevation with bilateral basal atelectasis CT angiogram 10/02/17- enlarged pulmonary artery suggestive of pulmonary hypertension, lower lobe atelectasis, right  hemidiaphragm elevation.  No pulmonary embolism I have reviewed all images personally 05/06/2018-chest x-ray- stable cardiomegaly, bibasilar atelectasis, no pleural effusion or large area pulmonary consolidation  Cardiac:  08/21/2015-echocardiogram-LV ejection fraction 55 to 65%, grade 2 diastolic dysfunction  Labs:  CBC 10/02/17-absolute eosinophil count 0 CBC 68/12/75-TZGYFVCB eosinophilic count 449 6/75/9163-[id number] metabolic panel- creatinine 1.63, GFR 40  Micro:  04/26/2018-urine culture- Pseudomonas, Klebsiella, resistant to multiple antibiotics  Chart Review:  04/08/2018-hospital admission for lumbar spinal fusion 04/16/2018- chronic hypercarbic respiratory failure with encephalopathy requiring BiPAP >>>discharged on 05/12/18 05/18/2018-hospital follow-up with cardiology >>>Weight down from 297 in April to 276 today >>>Stage III chronic kidney disease improving follow-up in 6 weeks 05/20/2018- follow-up with [place] [facility] and rehab status post lumbar fusion   05/25/18 HFU  Patient presents for hospital follow-up today.  Patient status post lumbar fusion April 2019.  Patient was readmitted to the hospital on 04/16/2018 for chronic hypercarbic respiratory failure requiring BiPAP.  Patient was discharged on 05/12/2018.  Patient has already followed up with cardiology as well as [facility] and re[facility], and has a hospital follow-up with primary care scheduled for tomorrow.  Patient reports breathing has been doing well on 2 L of oxygen.  Family is interested in qualifying for PCO2.  Patient also reports he has been compliant with CPAP use using it for 7 to 8 hours a day.  Having no issues.  Using nasal patient states even with CPAP use he continues to have occasional daytime fatigue but this is improved since his diuresis in April.  Patient was unable to weigh on scale today.  But chart review reveals that weight is down to 280 pounds on 05/20/2018.  April/2019 weight  was  297 on 04/16/18.   The patient has also been actively working to lower salt intake and following nutritional guidelines from cardiology for his heart failure.  Patient and wife have no complaints currently with her breathing.  Patient has not been using a stiletto inhaler since hospital stay, patient was told by pulmonology team in the hospital so that he does not need to use it if he has not seen a significant symptom relief or clinical change.    Allergies  Allergen Reactions  . Other Other (See Comments)    ANESTHESIA UNSPECIFIED  -- makes sick, must have antinausea medication in advance.  . Levofloxacin In D5w Itching    Possible infusion reaction 03/29/17. Tolerating oral Levaquin 03/30/17    Immunization History  Administered Date(s) Administered  . Influenza, High Dose Seasonal PF 10/13/2017    Past Medical History:  Diagnosis Date  . Cervical myelopathy (HCC)    with myelomalacia at C5-6 and C3-4  . CHF (congestive heart failure) ([place])   . Constipation - functional    controlled with miralax  . COPD (chronic obstructive pulmonary disease) (HCC)    mild  . DJD (degenerative joint disease)    WHOLE SPINE  . Dyspnea   . Gait disorder   . Heart murmur    years ago   . Hypertension   . Hypothyroidism    Low d/t treatment of hyperthyroidism  . Lumbosacral spinal stenosis 03/13/2014  . Peripheral neuropathy   . Pneumonia   . PONV (postoperative nausea and vomiting)   . RBBB   . Renal disorder    acute kidney failure  . Restless legs syndrome 03/14/2015  . RLS (restless legs syndrome)   . Sleep apnea    DX  2009/2010 study done at [facility]  . 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 Type II diabetes mellitus (HCC)    diet controlled    Tobacco History: Social History   Tobacco Use  Smoking Status Former Smoker  . Packs/day: 2.00  . Years: 20.00  . Pack years: 40.00  . Types: Cigarettes  . Last attempt to quit: 06/28/1985  . Years since quitting: 32.9  Smokeless  Tobacco Former Systems developer  . Quit date: 06/28/1985   Counseling given: Yes Continue to not smoke.  Patient aware that smoking can affect his breathing.  Outpatient Encounter Medications as of 05/25/2018  Medication Sig  . aspirin EC 81 MG EC tablet Take 1 tablet (81 mg total) by mouth daily.  [name] escitalopram (LEXAPRO) 10 MG tablet Take 1 tablet (10 mg total) by mouth daily.  [name] gabapentin (NEURONTIN) 400 MG capsule TAKE 1 CAPSULE (400 MG TOTAL) BY MOUTH EVERY 8 (EIGHT) HOURS.  [name] glimepiride (AMARYL) 2 MG tablet Take 1 tablet (2 mg total) by mouth daily with breakfast.  . hydrALAZINE (APRESOLINE) 50 MG tablet Take 1 tablet (50 mg total) by mouth every 8 (eight) hours.  [name] levothyroxine (SYNTHROID, LEVOTHROID) 175 MCG tablet Take 1 tablet (175 mcg total) by mouth daily.  . metoprolol tartrate (LOPRESSOR) 50 MG tablet Take 1 tablet (50 mg total) by mouth 2 (two) times daily.  . polyethylene glycol (MIRALAX / GLYCOLAX) packet Take 17 g by mouth daily as needed. For constipation  . potassium chloride SA (K-DUR,KLOR-CON) 20 MEQ tablet Take 1 tablet (20 mEq total) by mouth 2 (two) times daily.  . pramipexole (MIRAPEX) 0.5 MG tablet Take 1 tablet (0.5 mg total) by mouth 2 (two) times daily.  [name] senna (  SENOKOT) 8.6 MG TABS tablet Take 2 tablets (17.2 mg total) by mouth 2 (two) times daily.  . tamsulosin (FLOMAX) 0.4 MG CAPS capsule Take 1 capsule (0.4 mg total) by mouth daily.  [name] torsemide (DEMADEX) 20 MG tablet Take 40 mg by mouth 2 (two) times daily.  . TOVIAZ 8 MG TB24 tablet Take 1 tablet (8 mg total) by mouth daily.   No facility-administered encounter medications on file as of 05/25/2018.      Review of Systems   Constitutional:  +fatigue No weight loss, night sweats,  fevers, chills HEENT:   No headaches,  Difficulty swallowing,  Tooth/dental problems, or  Sore throat, No sneezing, itching, ear ache, nasal congestion, post nasal drip  CV: +chronic LE swelling No chest pain,  orthopnea, PND, dizziness,  palpitations, syncope  GI: No heartburn, indigestion, abdominal pain, nausea, vomiting, diarrhea, change in bowel habits, loss of appetite, bloody stools Resp: +sob with exertion, doing well on 2L   No excess mucus, no productive cough,  No non-productive cough,  No coughing up of blood.  No change in color of mucus.  No wheezing.  No chest wall deformity Skin: no rash, lesions, no skin changes. GU: no dysuria, change in color of urine, no urgency or frequency.  No flank pain, no hematuria  MS:  +using wheelchair No joint pain or swelling.  No decreased range of motion.  No back pain. Psych:  No change in mood or affect. No depression or anxiety.  No memory loss.   Physical Exam  BP (!) 142/70   Pulse 74   Ht 6\' 1"  (1.854 m)   SpO2 92%   BMI 36.94 kg/m   GEN: A/Ox3; pleasant , NAD, well nourished    HEENT:  [place]/AT,  EACs-cerumen bilaterally, TMs-unable to visualize right ear, NOSE-clear, THROAT-clear, mallampati III, no lesions, no postnasal drip or exudate noted.   NECK:  Supple w/ fair ROM; no JVD; no lymphadenopathy.    RESP:  Clear  P & A; w/o, wheezes/ rales/ or rhonchi. no accessory muscle use, no dullness to percussion  CARD:  +LE swelling, legs wrapped with compression wrap in office today RRR, no m/r/g, pulses intact, no cyanosis or clubbing.  GI:   Soft & nt; nml bowel sounds; no organomegaly or masses detected.   Musco: Warm bil, no deformities or joint swelling noted.   Neuro: alert, no focal deficits noted.    Skin: Warm, no lesions or rashes    Lab Results:  CBC    Component Value Date/Time   WBC 5.5 05/10/2018 0808   RBC 3.51 (L) 05/10/2018 0808   HGB 9.9 (L) 05/10/2018 0808   HCT 33.6 (L) 05/10/2018 0808   PLT 139 (L) 05/10/2018 0808   MCV 95.7 05/10/2018 0808   MCH 28.2 05/10/2018 0808   MCHC 29.5 (L) 05/10/2018 0808   RDW 16.4 (H) 05/10/2018 0808   LYMPHSABS 1.1 05/10/2018 0808   MONOABS 0.3 05/10/2018 0808   EOSABS 0.2 05/10/2018 0808    BASOSABS 0.0 05/10/2018 0808    BMET    Component Value Date/Time   NA 144 05/10/2018 0808   NA 145 (H) 01/05/2018 1037   K 3.6 05/10/2018 0808   CL 97 (L) 05/10/2018 0808   CO2 38 (H) 05/10/2018 0808   GLUCOSE 149 (H) 05/10/2018 0808   BUN 38 (H) 05/10/2018 0808   BUN 39 (H) 01/05/2018 1037   CREATININE 1.63 (H) 05/10/2018 0808   CREATININE 1.66 (H) 09/24/2016 6283  CALCIUM 9.1 05/10/2018 0808   GFRNONAA 40 (L) 05/10/2018 0808   GFRAA 47 (L) 05/10/2018 0808    BNP    Component Value Date/Time   BNP 120.9 (H) 04/11/2018 0407    ProBNP    Component Value Date/Time   PROBNP 109 02/12/2017 1522   PROBNP 1,750.0 (H) 07/10/2014 2105    Imaging: Dg Chest 2 View  Result Date: 05/06/2018 CLINICAL DATA:  Postoperative evaluation for lumbar surgery. EXAM: CHEST - 2 VIEW COMPARISON:  Chest radiograph 04/16/2018 FINDINGS: Cervical and midthoracic spinal fusion hardware. Stable cardiomegaly. Low lung volumes. Bibasilar atelectasis. No large area pulmonary consolidation. No pleural effusion or pneumothorax. Healing left rib fracture. IMPRESSION: Low lung volumes with basilar atelectasis. Electronically Signed   By: Lovey Newcomer M.D.   On: 05/06/2018 11:02     Assessment & Plan:   Pleasant 72 year old man presents today for hospital follow-up.  Patient doing well.  Patient on 2 L of oxygen.  Patient also reporting use of CPAP regularly.  No other respiratory concerns at this time.  Will test in office today to see if patient will qualify for simply go mini from [facility].  Patient did qualify so order will be placed today.  Patient and wife agree to follow-up with office if we have respiratory or sleeping changes.  If not they will follow-up in 3 months to see Dr. Vaughan Browner.  OSA on CPAP Continue CPAP use Follow-up with office if you are having issues using your CPAP or with the CPAP working   Restrictive lung disease Continue oxygen use We will qualify for portable oxygen  today Use rescue inhaler as needed for shortness of breath Follow-up in 3 months with Dr. Vaughan Browner   Chronic diastolic CHF (congestive heart failure) Continue to monitor weights daily Follow low-sodium diet Continue follow-up with cardiology in 6 weeks Notify cardiology if your weight is increasing or if you are noticing your lower extremity swelling is increasing Continue to elevate legs and use compression wraps  Morbid obesity ([place]) Continue to work towards healthy weight Slowly increasing activity     Lauraine Rinne, NP 05/25/2018

## 2018-05-25 NOTE — Patient Instructions (Addendum)
DME order placed for portable oxygen >>>takes about 3 weeks  >>>contact [facility] with questions   Continue close follow-up with cardiology Continue follow-up with primary care Continue follow-up with neurosurgery  Oxygen therapy as needed to maintain oxygen saturations greater than 90% >>> Check oxygen levels sporadically throughout the week   Continue following a low-sodium diet under 2000 mg of salt a day >>> Check labels as well as menu nutrition facts before ordering and purchasing food  Weigh yourself daily when able in the morning after going to the bathroom to continue a dry weight pattern >>> Notify cardiology if her swelling persist, or weight is trending up.  Continue CPAP use . Keep up the hard work using your device.  . Do not drive or operate heavy machinery if tired or drowsy.  . Please notify the supply company and office if you are unable to use your device regularly due to missing supplies or machine being broken.  . Work on maintaining a healthy weight and following your recommended nutrition plan  . Maintain proper daily exercise and movement  . Maintaining proper use of your device can also help improve management of other chronic illnesses such as: Blood pressure, blood sugars, and weight management.   CPAP Cleaning:  Clean weekly, with Dawn soap, and bottle brush.  Set up to air dry.  Continue albuterol inhaler use as needed for shortness of breath  Follow-up in 3 months with Dr. Vaughan Browner

## 2018-05-25 NOTE — Assessment & Plan Note (Signed)
Continue CPAP use Follow-up with office if you are having issues using your CPAP or with the CPAP working

## 2018-05-25 NOTE — Assessment & Plan Note (Signed)
Continue oxygen use We will qualify for portable oxygen today Use rescue inhaler as needed for shortness of breath Follow-up in 3 months with Dr. Vaughan Browner

## 2018-05-26 ENCOUNTER — Encounter: Payer: Medicare Other | Admitting: Physical Medicine & Rehabilitation

## 2018-06-15 ENCOUNTER — Telehealth: Payer: Self-pay | Admitting: Nurse Practitioner

## 2018-06-15 ENCOUNTER — Other Ambulatory Visit: Payer: Self-pay | Admitting: Nurse Practitioner

## 2018-06-15 DIAGNOSIS — I10 Essential (primary) hypertension: Secondary | ICD-10-CM

## 2018-06-15 DIAGNOSIS — I5032 Chronic diastolic (congestive) heart failure: Secondary | ICD-10-CM

## 2018-06-15 MED ORDER — TORSEMIDE 20 MG PO TABS: ORAL_TABLET | ORAL | 3 refills | Status: DC

## 2018-06-15 NOTE — Telephone Encounter (Signed)
Spoke with patient's wife, [name], who gives permission for Dr. Acie Fredrickson to talk with their daughter-in-law [name], who is a cardiac nurse. Wife states she is aware of the medication changes Dr. Acie Fredrickson gave to [facility] and will help patient to make those changes. I advised her to call back with questions or concerns prior to patient's appointment on 7/26 with Richardson Dopp, PA. She thanked me for the call.

## 2018-06-27 ENCOUNTER — Telehealth: Payer: Self-pay | Admitting: Internal Medicine

## 2018-06-27 NOTE — Telephone Encounter (Signed)
Received call from daughter in law, patient slightly more sleepy than usual, he doubled up on some of his medications including metoprolol.  HR in 40s.  I advised if they are at all worried then to not hesitate to come to the emergency room, they understand and will come should there be any worries.  He is somewhat chronically ill at baseline and she is comfortable with this plan. Forwarding to primary team. Zorita Pang MD

## 2018-06-28 ENCOUNTER — Ambulatory Visit: Payer: Medicare Other | Admitting: Cardiology

## 2018-06-28 ENCOUNTER — Emergency Department (HOSPITAL_COMMUNITY): Payer: Medicare Other

## 2018-06-28 ENCOUNTER — Encounter (HOSPITAL_COMMUNITY): Payer: Self-pay

## 2018-06-28 ENCOUNTER — Inpatient Hospital Stay (HOSPITAL_COMMUNITY)
Admission: EM | Admit: 2018-06-28 | Discharge: 2018-07-04 | DRG: 070 | Disposition: A | Discharge status: Skilled Nursing Facility | Payer: Medicare Other | Attending: Family Medicine | Admitting: Family Medicine

## 2018-06-28 DIAGNOSIS — R001 Bradycardia, unspecified: Secondary | ICD-10-CM | POA: Diagnosis present

## 2018-06-28 DIAGNOSIS — J9621 Acute and chronic respiratory failure with hypoxia: Secondary | ICD-10-CM | POA: Diagnosis present

## 2018-06-28 DIAGNOSIS — R4182 Altered mental status, unspecified: Secondary | ICD-10-CM

## 2018-06-28 DIAGNOSIS — K5904 Chronic idiopathic constipation: Secondary | ICD-10-CM | POA: Diagnosis present

## 2018-06-28 DIAGNOSIS — Z833 Family history of diabetes mellitus: Secondary | ICD-10-CM

## 2018-06-28 DIAGNOSIS — J9811 Atelectasis: Secondary | ICD-10-CM | POA: Diagnosis present

## 2018-06-28 DIAGNOSIS — Z9841 Cataract extraction status, right eye: Secondary | ICD-10-CM

## 2018-06-28 DIAGNOSIS — J9622 Acute and chronic respiratory failure with hypercapnia: Secondary | ICD-10-CM | POA: Diagnosis present

## 2018-06-28 DIAGNOSIS — G2581 Restless legs syndrome: Secondary | ICD-10-CM | POA: Diagnosis present

## 2018-06-28 DIAGNOSIS — R7989 Other specified abnormal findings of blood chemistry: Secondary | ICD-10-CM

## 2018-06-28 DIAGNOSIS — R778 Other specified abnormalities of plasma proteins: Secondary | ICD-10-CM | POA: Diagnosis present

## 2018-06-28 DIAGNOSIS — E1165 Type 2 diabetes mellitus with hyperglycemia: Secondary | ICD-10-CM | POA: Diagnosis not present

## 2018-06-28 DIAGNOSIS — Z981 Arthrodesis status: Secondary | ICD-10-CM

## 2018-06-28 DIAGNOSIS — I272 Pulmonary hypertension, unspecified: Secondary | ICD-10-CM | POA: Diagnosis present

## 2018-06-28 DIAGNOSIS — G4733 Obstructive sleep apnea (adult) (pediatric): Secondary | ICD-10-CM

## 2018-06-28 DIAGNOSIS — Z9842 Cataract extraction status, left eye: Secondary | ICD-10-CM

## 2018-06-28 DIAGNOSIS — Z881 Allergy status to other antibiotic agents status: Secondary | ICD-10-CM

## 2018-06-28 DIAGNOSIS — R748 Abnormal levels of other serum enzymes: Secondary | ICD-10-CM | POA: Diagnosis not present

## 2018-06-28 DIAGNOSIS — Z9981 Dependence on supplemental oxygen: Secondary | ICD-10-CM

## 2018-06-28 DIAGNOSIS — E1122 Type 2 diabetes mellitus with diabetic chronic kidney disease: Secondary | ICD-10-CM | POA: Diagnosis present

## 2018-06-28 DIAGNOSIS — Z9049 Acquired absence of other specified parts of digestive tract: Secondary | ICD-10-CM | POA: Diagnosis not present

## 2018-06-28 DIAGNOSIS — Z8249 Family history of ischemic heart disease and other diseases of the circulatory system: Secondary | ICD-10-CM

## 2018-06-28 DIAGNOSIS — N390 Urinary tract infection, site not specified: Secondary | ICD-10-CM | POA: Diagnosis present

## 2018-06-28 DIAGNOSIS — Z9989 Dependence on other enabling machines and devices: Secondary | ICD-10-CM | POA: Diagnosis not present

## 2018-06-28 DIAGNOSIS — Z1623 Resistance to quinolones and fluoroquinolones: Secondary | ICD-10-CM | POA: Diagnosis present

## 2018-06-28 DIAGNOSIS — N183 Chronic kidney disease, stage 3 unspecified: Secondary | ICD-10-CM | POA: Diagnosis present

## 2018-06-28 DIAGNOSIS — G9340 Encephalopathy, unspecified: Secondary | ICD-10-CM | POA: Diagnosis present

## 2018-06-28 DIAGNOSIS — E662 Morbid (severe) obesity with alveolar hypoventilation: Secondary | ICD-10-CM | POA: Diagnosis present

## 2018-06-28 DIAGNOSIS — E1136 Type 2 diabetes mellitus with diabetic cataract: Secondary | ICD-10-CM | POA: Diagnosis present

## 2018-06-28 DIAGNOSIS — Z794 Long term (current) use of insulin: Secondary | ICD-10-CM

## 2018-06-28 DIAGNOSIS — I130 Hypertensive heart and chronic kidney disease with heart failure and stage 1 through stage 4 chronic kidney disease, or unspecified chronic kidney disease: Secondary | ICD-10-CM | POA: Diagnosis present

## 2018-06-28 DIAGNOSIS — Z87891 Personal history of nicotine dependence: Secondary | ICD-10-CM

## 2018-06-28 DIAGNOSIS — M1990 Unspecified osteoarthritis, unspecified site: Secondary | ICD-10-CM | POA: Diagnosis present

## 2018-06-28 DIAGNOSIS — J984 Other disorders of lung: Secondary | ICD-10-CM | POA: Diagnosis not present

## 2018-06-28 DIAGNOSIS — I4510 Unspecified right bundle-branch block: Secondary | ICD-10-CM | POA: Diagnosis present

## 2018-06-28 DIAGNOSIS — R609 Edema, unspecified: Secondary | ICD-10-CM | POA: Diagnosis not present

## 2018-06-28 DIAGNOSIS — E874 Mixed disorder of acid-base balance: Secondary | ICD-10-CM | POA: Diagnosis present

## 2018-06-28 DIAGNOSIS — Z6837 Body mass index (BMI) 37.0-37.9, adult: Secondary | ICD-10-CM

## 2018-06-28 DIAGNOSIS — I5033 Acute on chronic diastolic (congestive) heart failure: Secondary | ICD-10-CM | POA: Diagnosis present

## 2018-06-28 DIAGNOSIS — R4781 Slurred speech: Secondary | ICD-10-CM | POA: Diagnosis present

## 2018-06-28 DIAGNOSIS — B965 Pseudomonas (aeruginosa) (mallei) (pseudomallei) as the cause of diseases classified elsewhere: Secondary | ICD-10-CM | POA: Diagnosis present

## 2018-06-28 DIAGNOSIS — E1142 Type 2 diabetes mellitus with diabetic polyneuropathy: Secondary | ICD-10-CM | POA: Diagnosis present

## 2018-06-28 DIAGNOSIS — E039 Hypothyroidism, unspecified: Secondary | ICD-10-CM | POA: Diagnosis present

## 2018-06-28 DIAGNOSIS — G9341 Metabolic encephalopathy: Secondary | ICD-10-CM | POA: Diagnosis not present

## 2018-06-28 DIAGNOSIS — N400 Enlarged prostate without lower urinary tract symptoms: Secondary | ICD-10-CM | POA: Diagnosis present

## 2018-06-28 DIAGNOSIS — I2510 Atherosclerotic heart disease of native coronary artery without angina pectoris: Secondary | ICD-10-CM | POA: Diagnosis present

## 2018-06-28 DIAGNOSIS — J449 Chronic obstructive pulmonary disease, unspecified: Secondary | ICD-10-CM | POA: Diagnosis not present

## 2018-06-28 DIAGNOSIS — I10 Essential (primary) hypertension: Secondary | ICD-10-CM | POA: Diagnosis not present

## 2018-06-28 DIAGNOSIS — R0689 Other abnormalities of breathing: Secondary | ICD-10-CM

## 2018-06-28 DIAGNOSIS — I5030 Unspecified diastolic (congestive) heart failure: Secondary | ICD-10-CM | POA: Diagnosis not present

## 2018-06-28 DIAGNOSIS — Z79890 Hormone replacement therapy (postmenopausal): Secondary | ICD-10-CM

## 2018-06-28 DIAGNOSIS — Z884 Allergy status to anesthetic agent status: Secondary | ICD-10-CM

## 2018-06-28 DIAGNOSIS — Z7982 Long term (current) use of aspirin: Secondary | ICD-10-CM

## 2018-06-28 DIAGNOSIS — Z7984 Long term (current) use of oral hypoglycemic drugs: Secondary | ICD-10-CM

## 2018-06-28 HISTORY — DX: Dependence on supplemental oxygen: Z99.81

## 2018-06-28 LAB — I-STAT ARTERIAL BLOOD GAS, ED
Acid-Base Excess: 10 mmol/L — ABNORMAL HIGH (ref 0.0–2.0)
Bicarbonate: 39.4 mmol/L — ABNORMAL HIGH (ref 20.0–28.0)
O2 Saturation: 95 %
PCO2 ART: 78 mmHg — AB (ref 32.0–48.0)
PH ART: 7.31 — AB (ref 7.350–7.450)
Patient temperature: 98
TCO2: 42 mmol/L — AB (ref 22–32)
pO2, Arterial: 85 mmHg (ref 83.0–108.0)

## 2018-06-28 LAB — CBC WITH DIFFERENTIAL/PLATELET
Abs Immature Granulocytes: 0 10*3/uL (ref 0.0–0.1)
BASOS ABS: 0 10*3/uL (ref 0.0–0.1)
BASOS PCT: 0 %
EOS PCT: 1 %
Eosinophils Absolute: 0.1 10*3/uL (ref 0.0–0.7)
HEMATOCRIT: 41.3 % (ref 39.0–52.0)
HEMOGLOBIN: 11.6 g/dL — AB (ref 13.0–17.0)
Immature Granulocytes: 0 %
Lymphocytes Relative: 12 %
Lymphs Abs: 0.6 10*3/uL — ABNORMAL LOW (ref 0.7–4.0)
MCH: 27.3 pg (ref 26.0–34.0)
MCHC: 28.1 g/dL — AB (ref 30.0–36.0)
MCV: 97.2 fL (ref 78.0–100.0)
MONO ABS: 0.3 10*3/uL (ref 0.1–1.0)
Monocytes Relative: 8 %
Neutro Abs: 3.5 10*3/uL (ref 1.7–7.7)
Neutrophils Relative %: 79 %
Platelets: 202 10*3/uL (ref 150–400)
RBC: 4.25 MIL/uL (ref 4.22–5.81)
RDW: 16.5 % — AB (ref 11.5–15.5)
WBC: 4.5 10*3/uL (ref 4.0–10.5)

## 2018-06-28 LAB — COMPREHENSIVE METABOLIC PANEL
ALBUMIN: 3.8 g/dL (ref 3.5–5.0)
ALK PHOS: 87 U/L (ref 38–126)
ALT: 26 U/L (ref 0–44)
ANION GAP: 11 (ref 5–15)
AST: 21 U/L (ref 15–41)
BILIRUBIN TOTAL: 0.8 mg/dL (ref 0.3–1.2)
BUN: 51 mg/dL — ABNORMAL HIGH (ref 8–23)
CALCIUM: 9.1 mg/dL (ref 8.9–10.3)
CO2: 36 mmol/L — ABNORMAL HIGH (ref 22–32)
Chloride: 99 mmol/L (ref 98–111)
Creatinine, Ser: 1.78 mg/dL — ABNORMAL HIGH (ref 0.61–1.24)
GFR calc non Af Amer: 36 mL/min — ABNORMAL LOW (ref >60)
GFR, EST AFRICAN AMERICAN: 42 mL/min — AB (ref >60)
GLUCOSE: 217 mg/dL — AB (ref 70–99)
POTASSIUM: 4.7 mmol/L (ref 3.5–5.1)
Sodium: 146 mmol/L — ABNORMAL HIGH (ref 135–145)
TOTAL PROTEIN: 7.7 g/dL (ref 6.5–8.1)

## 2018-06-28 LAB — I-STAT VENOUS BLOOD GAS, ED
Acid-Base Excess: 10 mmol/L — ABNORMAL HIGH (ref 0.0–2.0)
Bicarbonate: 40.2 mmol/L — ABNORMAL HIGH (ref 20.0–28.0)
O2 Saturation: 87 %
TCO2: 43 mmol/L — ABNORMAL HIGH (ref 22–32)
pCO2, Ven: 87.4 mmHg (ref 44.0–60.0)
pH, Ven: 7.271 (ref 7.250–7.430)
pO2, Ven: 63 mmHg — ABNORMAL HIGH (ref 32.0–45.0)

## 2018-06-28 LAB — URINALYSIS, ROUTINE W REFLEX MICROSCOPIC
BILIRUBIN URINE: NEGATIVE
Glucose, UA: NEGATIVE mg/dL
Hgb urine dipstick: NEGATIVE
KETONES UR: NEGATIVE mg/dL
LEUKOCYTES UA: NEGATIVE
NITRITE: NEGATIVE
PROTEIN: NEGATIVE mg/dL
Specific Gravity, Urine: 1.01 (ref 1.005–1.030)
pH: 5 (ref 5.0–8.0)

## 2018-06-28 LAB — TROPONIN I: TROPONIN I: 0.03 ng/mL — AB (ref <0.03)

## 2018-06-28 LAB — BRAIN NATRIURETIC PEPTIDE: B NATRIURETIC PEPTIDE 5: 319.2 pg/mL — AB (ref 0.0–100.0)

## 2018-06-28 LAB — AMMONIA: Ammonia: 29 umol/L (ref 9–35)

## 2018-06-28 LAB — CBG MONITORING, ED: GLUCOSE-CAPILLARY: 183 mg/dL — AB (ref 70–99)

## 2018-06-28 LAB — LACTIC ACID, PLASMA: LACTIC ACID, VENOUS: 0.9 mmol/L (ref 0.5–1.9)

## 2018-06-28 MED ORDER — SODIUM CHLORIDE 0.9% FLUSH: 3.0000 mL | INTRAVENOUS | Status: DC | PRN

## 2018-06-28 MED ORDER — TAMSULOSIN HCL 0.4 MG PO CAPS
0.4000 mg | ORAL_CAPSULE | Freq: Every day | ORAL | Status: DC
  Administered 2018-06-29 – 2018-07-03 (×5): 0.4 mg via ORAL
  Filled 2018-06-28 (×5): qty 1

## 2018-06-28 MED ORDER — SODIUM CHLORIDE 0.9 % IV SOLN
250.0000 mL | INTRAVENOUS | Status: DC | PRN
Start: 2018-06-28 — End: 2018-07-04

## 2018-06-28 MED ORDER — HYDRALAZINE HCL 50 MG PO TABS
50.0000 mg | ORAL_TABLET | Freq: Three times a day (TID) | ORAL | Status: DC
  Administered 2018-06-29 – 2018-07-04 (×15): 50 mg via ORAL
  Filled 2018-06-28 (×5): qty 1
  Filled 2018-06-28: qty 2
  Filled 2018-06-28 (×9): qty 1

## 2018-06-28 MED ORDER — SENNA 8.6 MG PO TABS
2.0000 | ORAL_TABLET | Freq: Two times a day (BID) | ORAL | Status: DC
  Administered 2018-06-29 – 2018-07-04 (×11): 17.2 mg via ORAL
  Filled 2018-06-28 (×11): qty 2

## 2018-06-28 MED ORDER — HEPARIN SODIUM (PORCINE) 5000 UNIT/ML IJ SOLN
5000.0000 [IU] | Freq: Three times a day (TID) | INTRAMUSCULAR | Status: DC
  Administered 2018-06-28 – 2018-07-04 (×17): 5000 [IU] via SUBCUTANEOUS
  Filled 2018-06-28 (×17): qty 1

## 2018-06-28 MED ORDER — FUROSEMIDE 10 MG/ML IJ SOLN
80.0000 mg | Freq: Once | INTRAMUSCULAR | Status: AC
  Administered 2018-06-28: 80 mg via INTRAVENOUS
  Filled 2018-06-28: qty 8

## 2018-06-28 MED ORDER — ONDANSETRON HCL 4 MG/2ML IJ SOLN: 4.0000 mg | Freq: Four times a day (QID) | INTRAMUSCULAR | Status: DC | PRN

## 2018-06-28 MED ORDER — CIPROFLOXACIN HCL 500 MG PO TABS
500.0000 mg | ORAL_TABLET | Freq: Two times a day (BID) | ORAL | Status: AC
  Administered 2018-06-29 – 2018-07-02 (×7): 500 mg via ORAL
  Filled 2018-06-28 (×7): qty 1

## 2018-06-28 MED ORDER — GABAPENTIN 400 MG PO CAPS
400.0000 mg | ORAL_CAPSULE | Freq: Three times a day (TID) | ORAL | Status: DC
  Administered 2018-06-29 – 2018-07-04 (×16): 400 mg via ORAL
  Filled 2018-06-28 (×16): qty 1

## 2018-06-28 MED ORDER — ESCITALOPRAM OXALATE 10 MG PO TABS
10.0000 mg | ORAL_TABLET | Freq: Every day | ORAL | Status: DC
  Administered 2018-06-29 – 2018-07-04 (×6): 10 mg via ORAL
  Filled 2018-06-28 (×6): qty 1

## 2018-06-28 MED ORDER — PRAMIPEXOLE DIHYDROCHLORIDE 0.25 MG PO TABS
0.5000 mg | ORAL_TABLET | Freq: Two times a day (BID) | ORAL | Status: DC
  Administered 2018-06-29 – 2018-07-04 (×10): 0.5 mg via ORAL
  Filled 2018-06-28 (×10): qty 2

## 2018-06-28 MED ORDER — ALBUTEROL SULFATE (2.5 MG/3ML) 0.083% IN NEBU: 2.5000 mg | INHALATION_SOLUTION | RESPIRATORY_TRACT | Status: DC | PRN

## 2018-06-28 MED ORDER — ACETAMINOPHEN 325 MG PO TABS: 650.0000 mg | ORAL_TABLET | ORAL | Status: DC | PRN

## 2018-06-28 MED ORDER — ASPIRIN EC 81 MG PO TBEC
81.0000 mg | DELAYED_RELEASE_TABLET | Freq: Every day | ORAL | Status: DC
  Administered 2018-06-29 – 2018-07-04 (×6): 81 mg via ORAL
  Filled 2018-06-28 (×6): qty 1

## 2018-06-28 MED ORDER — SODIUM CHLORIDE 0.9% FLUSH
3.0000 mL | Freq: Two times a day (BID) | INTRAVENOUS | Status: DC
  Administered 2018-06-28 – 2018-07-03 (×10): 3 mL via INTRAVENOUS

## 2018-06-28 MED ORDER — INSULIN ASPART 100 UNIT/ML ~~LOC~~ SOLN
0.0000 [IU] | SUBCUTANEOUS | Status: DC
  Administered 2018-06-28: 2 [IU] via SUBCUTANEOUS
  Administered 2018-06-29 (×2): 1 [IU] via SUBCUTANEOUS
  Administered 2018-06-29: 2 [IU] via SUBCUTANEOUS
  Administered 2018-06-29: 1 [IU] via SUBCUTANEOUS
  Administered 2018-06-29: 2 [IU] via SUBCUTANEOUS
  Filled 2018-06-28 (×4): qty 1

## 2018-06-28 MED ORDER — LEVOTHYROXINE SODIUM 75 MCG PO TABS
175.0000 ug | ORAL_TABLET | Freq: Every day | ORAL | Status: DC
  Administered 2018-06-30 – 2018-07-04 (×5): 175 ug via ORAL
  Filled 2018-06-28 (×6): qty 1

## 2018-06-28 MED ORDER — METOPROLOL TARTRATE 50 MG PO TABS
50.0000 mg | ORAL_TABLET | Freq: Two times a day (BID) | ORAL | Status: DC
  Administered 2018-06-29 (×2): 50 mg via ORAL
  Filled 2018-06-28: qty 2
  Filled 2018-06-28: qty 1

## 2018-06-28 MED ORDER — FUROSEMIDE 10 MG/ML IJ SOLN
60.0000 mg | Freq: Two times a day (BID) | INTRAMUSCULAR | Status: DC
  Administered 2018-06-29 – 2018-06-30 (×3): 60 mg via INTRAVENOUS
  Filled 2018-06-28 (×3): qty 6

## 2018-06-28 MED ORDER — FESOTERODINE FUMARATE ER 8 MG PO TB24
8.0000 mg | ORAL_TABLET | Freq: Every day | ORAL | Status: DC
  Administered 2018-06-30 – 2018-07-04 (×5): 8 mg via ORAL
  Filled 2018-06-28 (×5): qty 1

## 2018-06-28 NOTE — ED Notes (Signed)
Patient transported to CT 

## 2018-06-28 NOTE — H&P (Signed)
History and Physical    Travis [name] GBT:[id number] DOB: 20-May-1946 DOA: 06/28/2018  PCP: Travis Arabian, MD   Patient coming from: Home  Chief Complaint: Somnolence, confusion, waking with SOB at night  HPI: Travis [name] is a 72 y.o. male with medical history significant for type 2 diabetes mellitus, hypothyroidism, hypertension, chronic kidney disease stage III, COPD, and chronic hypoxic respiratory failure, now presenting to the emergency department for evaluation of somnolence and confusion.  History is obtained primarily from the patient's wife and daughter at the bedside as well as EMR and discussion with ED personnel.  Patient had back surgery in April and has reportedly been more fatigued since that time, but this has progressed significantly over the past week or so, over which interval he has been sleeping most of the day and seems to be mildly confused at times.  He has been noted to wake from sleep acutely short of breath multiple times every night for the past few days.  He has had progressive bilateral lower extremity swelling and recently had diuretics increased for this reason, but with no appreciable improvement.  He was diagnosed with UTI on 06/25/2018 and started on ciprofloxacin.  His condition has not improved with treatment of this.  He denies chest pain, fevers, or chills.  ED Course: Upon arrival to the ED, patient is found to be afebrile, saturating low 90s on his usual 2 L/min supplemental oxygen, mildly tachypneic, bradycardic in the mid 50s, and with stable blood pressure.  EKG features a sinus rhythm with PVC and RBBB.  Chest x-ray is suggestive of pulmonary vascular congestion and possible pneumoperitoneum.  This was followed by CT of the abdomen and pelvis which is negative for any acute intra-abdominal or pelvic abnormality.  Chemistry panel is notable for a bicarbonate of 36, BUN 51, and creatinine 1.78, up from 1.63 in May.  CBC features and improved normocytic  anemia.  Lactic acid is reassuringly normal, troponin is slightly elevated at 0.03, and BNP is elevated to 319.  Urinalysis is unremarkable.  Venous blood gas reveals significant hypercarbia.  Patient was started on BiPAP and treated with 80 mg IV Lasix.  He remains hemodynamically stable and will be admitted for ongoing evaluation and management of acute encephalopathy, likely secondary to hypercarbia, which is in turn suspected secondary to acute on chronic diastolic CHF.  Review of Systems:  All other systems reviewed and apart from HPI, are negative.  Past Medical History:  Diagnosis Date  . Cervical myelopathy (HCC)    with myelomalacia at C5-6 and C3-4  . CHF (congestive heart failure) ([place])   . Constipation - functional    controlled with miralax  . COPD (chronic obstructive pulmonary disease) (HCC)    mild  . DJD (degenerative joint disease)    WHOLE SPINE  . Dyspnea   . Gait disorder   . Heart murmur    years ago   . Hypertension   . Hypothyroidism    Low d/t treatment of hyperthyroidism  . Lumbosacral spinal stenosis 03/13/2014  . Peripheral neuropathy   . Pneumonia   . PONV (postoperative nausea and vomiting)   . RBBB   . Renal disorder    acute kidney failure  . Restless legs syndrome 03/14/2015  . RLS (restless legs syndrome)   . Sleep apnea    DX  2009/2010 [facility] done at [facility]  . 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 Type II diabetes mellitus ([place])  diet controlled    Past Surgical History:  Procedure Laterality Date  . BACK SURGERY     6 surgeries  . CARDIAC CATHETERIZATION [name] 09/07/2015   Procedure: Right/Left Heart Cath and Coronary Angiography;  Surgeon: Peter M Martinique, MD;  Location: [facility];  Service: Cardiovascular;  Laterality: [name];  . CHOLECYSTECTOMY    . EYE SURGERY     BIL CATARACTS  . LUMBAR LAMINECTOMY/DECOMPRESSION MICRODISCECTOMY  07/14/2012   Procedure: LUMBAR LAMINECTOMY/DECOMPRESSION MICRODISCECTOMY 2 LEVELS;  Surgeon: Eustace Moore, MD;  Location: [place] [facility];  Service: Neurosurgery;  Laterality: Left;  Lumbar two three laminectomy, left thoracic four five transpedicular diskectomy  . POSTERIOR CERVICAL FUSION/FORAMINOTOMY  04/08/2018   Procedure: Laminectomy and Foraminotomy - Lumbar Four-Lumbar Five, instrumented posterolateral fusion Lumbar Four- Five, Laminectomy and Foraminotomy - Thoracic One-Thoracic Two;  Surgeon: Eustace Moore, MD;  Location: [facility] OR;  Service: Neurosurgery;;  posterior  . POSTERIOR LUMBAR FUSION  04/08/2018   Laminectomy and Foraminotomy - Lumbar Four-Lumbar Five, instrumented posterolateral fusion Lumbar Four- Five, Laminectomy and Foraminotomy - Thoracic One-Thoracic Two  . POSTERIOR LUMBAR FUSION  04/08/2018   Laminectomy and Foraminotomy - Lumbar Four-Lumbar Five, instrumented posterolateral fusion Lumbar Four- Five, Laminectomy and Foraminotomy - Thoracic One-Thoracic Two     reports that he quit smoking about 33 years ago. His smoking use included cigarettes. He has a 40.00 pack-year smoking history. He quit smokeless tobacco use about 33 years ago. He reports that he does not drink alcohol or use drugs.  Allergies  Allergen Reactions  . Other Other (See Comments)    ANESTHESIA UNSPECIFIED  -- makes sick, must have antinausea medication in advance.  . Levofloxacin In D5w Itching    Possible infusion reaction 03/29/17. Tolerating oral Levaquin 03/30/17    Family History  Problem Relation Age of Onset  . Polycythemia Mother   . Diabetes Father   . Hypertension Father   . Heart disease Father   . CAD Unknown        1 of his 4 brothers and father has CAD     Prior to Admission medications   Medication Sig Start Date End Date Taking? Authorizing Provider  aspirin EC 81 MG EC tablet Take 1 tablet (81 mg total) by mouth daily. 08/23/15  Yes Thurnell Lose, MD  ciprofloxacin (CIPRO) 500 MG tablet Take 500 mg by mouth 2 (two) times daily. For 7 days 06/25/18 07/02/18 Yes [provider]  escitalopram (LEXAPRO) 10 MG tablet Take 1 tablet (10 mg total) by mouth daily. 05/12/18  Yes Angiulli, Lavon Paganini, [name]  gabapentin (NEURONTIN) 400 MG capsule TAKE 1 CAPSULE (400 MG TOTAL) BY MOUTH EVERY 8 (EIGHT) HOURS. 05/12/18  Yes Angiulli, Lavon Paganini, [name]  glimepiride (AMARYL) 2 MG tablet Take 1 tablet (2 mg total) by mouth daily with breakfast. 05/13/18  Yes Angiulli, Lavon Paganini, [name]  hydrALAZINE (APRESOLINE) 50 MG tablet Take 1 tablet (50 mg total) by mouth every 8 (eight) hours. 05/12/18  Yes Angiulli, Lavon Paganini, [name]  levothyroxine (SYNTHROID, LEVOTHROID) 175 MCG tablet Take 1 tablet (175 mcg total) by mouth daily. 05/12/18  Yes Angiulli, Lavon Paganini, [name]  metoprolol tartrate (LOPRESSOR) 50 MG tablet Take 1 tablet (50 mg total) by mouth 2 (two) times daily. 05/12/18  Yes Angiulli, Lavon Paganini, [name]  polyethylene glycol (MIRALAX / GLYCOLAX) packet Take 17 g by mouth daily as needed. For constipation 08/09/14  Yes Love, Ivan Anchors, [name]  potassium chloride SA (K-DUR,KLOR-CON) 20 MEQ  tablet Take 1 tablet (20 mEq total) by mouth 2 (two) times daily. 05/12/18  Yes Angiulli, Lavon Paganini, [name]  pramipexole (MIRAPEX) 0.5 MG tablet Take 1 tablet (0.5 mg total) by mouth 2 (two) times daily. 05/12/18  Yes Angiulli, Lavon Paganini, [name]  senna (SENOKOT) 8.6 MG TABS tablet Take 2 tablets (17.2 mg total) by mouth 2 (two) times daily. 05/12/18  Yes Angiulli, Lavon Paganini, [name]  tamsulosin (FLOMAX) 0.4 MG CAPS capsule Take 1 capsule (0.4 mg total) by mouth daily. 05/12/18  Yes Angiulli, Lavon Paganini, [name]  torsemide (DEMADEX) 20 MG tablet Take 60 mg twice daily on M, W, F and 40 mg twice daily on other days 06/15/18  Yes Nahser, Wonda Cheng, MD  TOVIAZ 8 MG TB24 tablet Take 1 tablet (8 mg total) by mouth daily. 05/12/18  Yes [name], [name]    Physical Exam: Vitals:   06/28/18 2008 06/28/18 2015 06/28/18 2045 06/28/18 2100  BP: (!) 147/66 (!) 115/45 (!) 120/51 (!) 115/47  Pulse: 86 64 (!) 57 (!) 56  Resp: 14 20 17  (!) 22   Temp:      TempSrc:      SpO2: 92% 93% 96% 96%      Constitutional: No acute distress, somnolent, easily roused  Eyes: PERTLA, lids and conjunctivae normal ENMT: Mucous membranes are moist. Posterior pharynx clear of any exudate or lesions.   Neck: normal, supple, no masses, no thyromegaly Respiratory: Mildly diminished breath sounds bilaterally, no wheezing, no crackles. Normal respiratory effort.   Cardiovascular: S1 & S2 heard, regular rate and rhythm. Pitting edema to bilateral LE's. Abdomen: No distension, no tenderness, soft. Bowel sounds normal.  Musculoskeletal: no clubbing / cyanosis. No joint deformity upper and lower extremities.   Skin: no significant rashes, lesions, ulcers. Warm, dry, well-perfused. Neurologic: CN 2-12 grossly intact. Sensation intact, DTR normal. Strength 5/5 in all 4 limbs.  Psychiatric: Somnolent, easily roused and oriented to person, place, and situation. Pleasant and cooperative.     Labs on Admission: I have personally reviewed following labs and imaging studies  CBC: Recent Labs  Lab 06/28/18 1731  WBC 4.5  NEUTROABS 3.5  HGB 11.6*  HCT 41.3  MCV 97.2  PLT 767   Basic Metabolic Panel: Recent Labs  Lab 06/28/18 1949  NA 146*  K 4.7  CL 99  CO2 36*  GLUCOSE 217*  BUN 51*  CREATININE 1.78*  CALCIUM 9.1   GFR: CrCl cannot be calculated (Unknown ideal weight.). Liver Function Tests: Recent Labs  Lab 06/28/18 1949  AST 21  ALT 26  ALKPHOS 87  BILITOT 0.8  PROT 7.7  ALBUMIN 3.8   No results for input(s): LIPASE, AMYLASE in the last 168 hours. Recent Labs  Lab 06/28/18 1744  AMMONIA 29   Coagulation Profile: No results for input(s): INR, PROTIME in the last 168 hours. Cardiac Enzymes: Recent Labs  Lab 06/28/18 1949  TROPONINI 0.03*   BNP (last 3 results) No results for input(s): PROBNP in the last 8760 hours. HbA1C: No results for input(s): HGBA1C in the last 72 hours. CBG: No results for input(s): GLUCAP  in the last 168 hours. Lipid Profile: No results for input(s): CHOL, HDL, LDLCALC, TRIG, CHOLHDL, LDLDIRECT in the last 72 hours. Thyroid Function Tests: No results for input(s): TSH, T4TOTAL, FREET4, T3FREE, THYROIDAB in the last 72 hours. Anemia Panel: No results for input(s): VITAMINB12, FOLATE, FERRITIN, TIBC, IRON, RETICCTPCT in the last 72 hours. Urine analysis:    Component Value Date/Time  COLORURINE YELLOW 06/28/2018 [place] 06/28/2018 1744   LABSPEC 1.010 06/28/2018 1744   PHURINE 5.0 06/28/2018 1744   GLUCOSEU NEGATIVE 06/28/2018 1744   HGBUR NEGATIVE 06/28/2018 1744   BILIRUBINUR NEGATIVE 06/28/2018 1744   KETONESUR NEGATIVE 06/28/2018 1744   PROTEINUR NEGATIVE 06/28/2018 1744   UROBILINOGEN 0.2 08/22/2015 1820   NITRITE NEGATIVE 06/28/2018 1744   LEUKOCYTESUR NEGATIVE 06/28/2018 1744   Sepsis Labs: @LABRCNTIP (procalcitonin:4,lacticidven:4) )No results found for this or any previous visit (from the past 240 hour(s)).   Radiological Exams on Admission: Ct Abdomen Pelvis Wo Contrast  Result Date: 06/28/2018 CLINICAL DATA:  Weakness and fatigue. Patient diagnosed with the urinary tract infection 06/25/2018. EXAM: CT ABDOMEN AND PELVIS WITHOUT CONTRAST TECHNIQUE: Multidetector CT imaging of the abdomen and pelvis was performed following the standard protocol without IV contrast. COMPARISON:  CT abdomen and pelvis 08/22/2015. FINDINGS: Lower chest: There is cardiomegaly. Calcific aortic and coronary atherosclerosis is identified. Dependent atelectasis is seen in the lung bases. Hepatobiliary: No focal liver abnormality is seen. Status post cholecystectomy. No biliary dilatation. Pancreas: Unremarkable. No pancreatic ductal dilatation or surrounding inflammatory changes. Fatty atrophy is noted Spleen: Normal in size without focal abnormality. Adrenals/Urinary Tract: Adrenal glands are unremarkable. Kidneys are normal, without renal calculi, focal lesion, or  hydronephrosis. Bladder is unremarkable. Stomach/Bowel: Stomach is within normal limits. Appendix appears normal. No evidence of bowel wall thickening, distention, or inflammatory changes. Vascular/Lymphatic: Aortic atherosclerosis. No enlarged abdominal or pelvic lymph nodes. Reproductive: The patient appears to be status post TURP. Other: No fluid collection. Musculoskeletal: The patient is status post T7-10 and L4-5 fusion. Advanced multilevel spondylosis is identified. No worrisome lesion. No acute abnormality. IMPRESSION: No acute abnormality abdomen or pelvis. Cardiomegaly. Calcific aortic and coronary atherosclerosis. Advanced multilevel thoracic and lumbar spondylosis with postoperative change noted. Electronically Signed   By: Inge Rise M.D.   On: 06/28/2018 20:12   Dg Chest 2 View  Result Date: 06/28/2018 CLINICAL DATA:  Weakness and fatigue.  COPD.  CHF. EXAM: CHEST - 2 VIEW COMPARISON:  05/06/2018. FINDINGS: Midthoracic fusion apparatus appears stable. The heart is enlarged. There is a considerable amount of air under the RIGHT hemidiaphragm concerning for pneumoperitoneum. Colonic interposition could have this appearance, but this is a new finding from previous. A lateral decubitus film is recommended for further evaluation. Alternately, noncontrast CT could be performed. BILATERAL pulmonary opacities are seen representing vascular congestion, similar aeration to priors. Bibasilar atelectasis is superimposed. IMPRESSION: Considerable amount of air under the RIGHT hemidiaphragm concerning for pneumoperitoneum. Colonic interposition could have a similar appearance, and lateral decubitus film versus noncontrast CT of the abdomen and pelvis is recommended for further evaluation. A call has been placed to the ordering provider to directly communicate the findings. Electronically Signed   By: Staci Righter M.D.   On: 06/28/2018 18:20   Ct Head Wo Contrast  Result Date: 06/28/2018 CLINICAL DATA:   72 year old male with generalized weakness and altered mental status. EXAM: CT HEAD WITHOUT CONTRAST TECHNIQUE: Contiguous axial images were obtained from the base of the skull through the vertex without intravenous contrast. COMPARISON:  Cervical spine MRI 06/27/2005. FINDINGS: Brain: Cerebral volume is within normal limits for age. No midline shift, ventriculomegaly, mass effect, evidence of mass lesion, intracranial hemorrhage or evidence of cortically based acute infarction. Mild for age nonspecific white matter hypodensity. Otherwise normal gray-white matter differentiation with no cortical encephalomalacia identified. Vascular: Calcified atherosclerosis at the skull base. No suspicious intracranial vascular hyperdensity. Skull: No skull fracture  identified. Upper cervical spine degeneration including ligamentous hypertrophy about the odontoid resulting in upper cervical spinal stenosis (series 6, image 29). Carious maxillary dentition. Sinuses/Orbits: Multiple small maxillary sinus mucous retention cysts. Otherwise well pneumatized. Tympanic cavities and mastoids are clear. Other: Postoperative changes to both globes. No acute orbit or scalp soft tissue finding. IMPRESSION: 1. No acute intracranial abnormality. Mild for age nonspecific cerebral white matter changes. 2. 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 Electronically Signed   By: Genevie Ann M.D.   On: 06/28/2018 18:42    EKG: Independently reviewed. Sinus rhythm, RBBB, PVC.   Assessment/Plan  1. Acute encephalopathy  - Presents with increased somnolence and mild intermittent confusion  - Head CT is negative for acute findings and no focal neurologic deficits identified  - Recently diagnosed with UTI and started on abx, but no evidence for acute infection on admission  - ED workup reveals hypercarbia, which is most likely etiology  - Check TSH, ammonia, B12, folate, RPR  - Continue to treat hypercarbia as below    2. Acute on  chronic hypercarbic respiratory failure  - Presents with somnolence and mild intermittent confusion  - Found to be hypercarbic in ED and started on BiPAP  - No wheezing or rhonchi appreciated  - Suspect this is secondary to CHF  - Continue diuresis, continue BiPAP for now and repeat blood gas    3. Acute on chronic diastolic CHF  - Reports recent PND and increased peripheral edema despite recent increase in torsemide  - Treated in ED with Lasix 80 IV  - Continue diuresis with Lasix 60 mg IV q12h  - Continue beta-blocker  - Check wt, SLIV, follow daily wt and I/O's, update echo   4. CKD stage III  - SCr is 1.78 on admission, close to apparent baseline  - Renally-dose medications, avoid nephrotoxins, follow daily chem panel while diuresing    5. Hypothyroidism  - Check TSH given presenting complaints  - Continue Synthroid   6. Elevated troponin  - Troponin slightly elevated to 0.03 in ED without chest pain  - Low suspicion for ACS  - Continue cardiac monitoring, continue ASA and beta-blocker, trend troponin    7. UTI  - Diagnosed on 6/28 and started on ciprofloxacin - UA unremarkable in ED, no fever or leukocytosis, will send for culture and continue ciprofloxacin to complete course    8. Type II DM  - A1c was 7.7% in March  - Managed at home with glimepiride, held on admission  - Follow CBG's and use a SSI with Novolog while in hospital    DVT prophylaxis: sq heparin  Code Status: Full  Family Communication: Daughter and wife updated at bedside  Consults called: None Admission status: Inpatient    Vianne Bulls, MD [facility] Pager [phone]  If 7PM-7AM, please contact night-coverage [email or web address] Password [facility]  06/28/2018, 9:47 PM

## 2018-06-28 NOTE — Progress Notes (Signed)
Placed patient on Bipap to help patient wake up and blow off CO2.  RT will obtain a arterial blood gas in 1 hour per RT protocol.

## 2018-06-28 NOTE — ED Notes (Signed)
Condom cath applied

## 2018-06-28 NOTE — ED Triage Notes (Signed)
Pt arrived via [facility]; per EMS pt home with c/o weakness and fatigue; A/Ox4; Pt dx Fri with UTI, pt on unknown abt; diuretic (torosemide) increased last week to increase urination but patient continued to retain fluid; hx hernia, COPD, CHF, upper and lower back sx earlier this year; pt had rod placed. 132/88; 60; 18; 94% on 3L

## 2018-06-28 NOTE — Progress Notes (Signed)
ABG obtained pt CO2 is still high increased patients Bipap settings from 14 to 18 IPAP to help blow off more

## 2018-06-28 NOTE — ED Provider Notes (Signed)
Emergency Department Provider Note   I have reviewed the triage vital signs and the nursing notes.   HISTORY  Chief Complaint Weakness   HPI Travis [name] is a 72 y.o. male with medical problems documented below the presents to the emergency department today secondary to altered mental status.  History is given by the daughter-in-law who is a Marine scientist and the wife and not much by the patient.  He is alert to voice and oriented to place and situation is not offer much history.  Sounds like patient has had multiple episodes in the past of having infections or acute heart failure exacerbations that have caused him to be altered similar to this.  This episode is been gone for a few days with a progressively worsening lower extremity and abdominal edema and they have been trying to get his torsemide figured out.  Was found to have a urinary tract infection started on ciprofloxacin Friday however continues to be having increased sleepiness.  No fevers.  No nausea or vomiting.  Still eating.  No history of liver problems.  No chest pain.  No diarrhea or constipation. Family states slurred speech as well but no focal deficits.  No other associated or modifying symptoms.    Past Medical History:  Diagnosis Date  . Cervical myelopathy (HCC)    with myelomalacia at C5-6 and C3-4  . CHF (congestive heart failure) ([place])   . Constipation - functional    controlled with miralax  . COPD (chronic obstructive pulmonary disease) (HCC)    mild  . DJD (degenerative joint disease)    WHOLE SPINE  . Dyspnea   . Gait disorder   . Heart murmur    years ago   . Hypertension   . Hypothyroidism    Low d/t treatment of hyperthyroidism  . Lumbosacral spinal stenosis 03/13/2014  . Peripheral neuropathy   . Pneumonia   . PONV (postoperative nausea and vomiting)   . RBBB   . Renal disorder    acute kidney failure  . Restless legs syndrome 03/14/2015  . RLS (restless legs syndrome)   . Sleep apnea    DX   2009/2010 [facility] at [facility]  . 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 Type II diabetes mellitus ([place])    diet controlled    Patient Active Problem List   Diagnosis Date Noted  . Acute encephalopathy 06/28/2018  . Oxygen desaturation   . Supplemental oxygen dependent   . Poorly controlled type 2 diabetes mellitus with peripheral neuropathy ([place])   . Labile blood pressure   . Labile blood glucose   . Acute on chronic diastolic (congestive) heart failure ([place])   . Myelopathy ([place]) 04/16/2018  . Benign essential HTN   . Benign prostatic hyperplasia with urinary retention   . Acute on chronic respiratory failure with hypoxia and hypercapnia (HCC)   . DDD (degenerative disc disease), cervical   . Morbid obesity ([place])   . S/P lumbar spinal fusion 04/08/2018  . 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 Myelopathy, spondylogenic, cervical 11/11/2017  . Excessive daytime sleepiness 11/11/2017  . 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 Debility 10/08/2017  . Stage 3 chronic kidney disease ([place])   . Spinal stenosis of lumbar region   . Acute blood loss anemia   . Urinary tract infection without hematuria   . Elevated troponin I level   . Hypothyroid 08/19/2015  . Restless legs syndrome  03/14/2015  . Shortness of breath 03/09/2015  . Sinus bradycardia 09/21/2014  . Thoracic spondylosis with myelopathy 09/13/2014  . Cervical arthritis with myelopathy 07/18/2014  . Constipation - functional   . Sepsis ([place]) 07/10/2014  . OSA on CPAP 07/10/2014  . Edema leg 07/10/2014  . Lumbosacral spinal stenosis 03/13/2014  . Other vitamin B12 deficiency anemia 07/13/2013  . 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 Polyneuropathy in other diseases classified elsewhere ([place]) 02/17/2013  . Other general symptoms(780.99) 02/17/2013  . Abnormality of gait 02/17/2013  . Cervical spondylosis with myelopathy 02/17/2013  . Back ache  09/15/2012    Past Surgical History:  Procedure Laterality Date  . BACK SURGERY     6 surgeries  . CARDIAC CATHETERIZATION [name] 09/07/2015   Procedure: Right/Left Heart Cath and Coronary Angiography;  Surgeon: Peter M Martinique, MD;  Location: [facility];  Service: Cardiovascular;  Laterality: [name];  . CHOLECYSTECTOMY    . EYE SURGERY     BIL CATARACTS  . LUMBAR LAMINECTOMY/DECOMPRESSION MICRODISCECTOMY  07/14/2012   Procedure: LUMBAR LAMINECTOMY/DECOMPRESSION MICRODISCECTOMY 2 LEVELS;  Surgeon: Eustace Moore, MD;  Location: [facility];  Service: Neurosurgery;  Laterality: Left;  Lumbar two three laminectomy, left thoracic four five transpedicular diskectomy  . POSTERIOR CERVICAL FUSION/FORAMINOTOMY  04/08/2018   Procedure: Laminectomy and Foraminotomy - Lumbar Four-Lumbar Five, instrumented posterolateral fusion Lumbar Four- Five, Laminectomy and Foraminotomy - Thoracic One-Thoracic Two;  Surgeon: Eustace Moore, MD;  Location: [facility] OR;  Service: Neurosurgery;;  posterior  . POSTERIOR LUMBAR FUSION  04/08/2018   Laminectomy and Foraminotomy - Lumbar Four-Lumbar Five, instrumented posterolateral fusion Lumbar Four- Five, Laminectomy and Foraminotomy - Thoracic One-Thoracic Two  . POSTERIOR LUMBAR FUSION  04/08/2018   Laminectomy and Foraminotomy - Lumbar Four-Lumbar Five, instrumented posterolateral fusion Lumbar Four- Five, Laminectomy and Foraminotomy - Thoracic One-Thoracic Two    Current Outpatient Rx  . Order #: [id number] Class: Print  . Order #: [id number] Class: Historical Med  . Order #: [id number] Class: Print  . Order #: [id number] Class: Print  . Order #: [id number] Class: Print  . Order #: [id number] Class: Print  . Order #: [id number] Class: Print  . Order #: [id number] Class: Print  . Order #: [id number] Class: Normal  . Order #: [id number] Class: Print  . Order #: [id number] Class: Print  . Order #: [id number] Class: No Print  . Order #: [id number] Class: Print  . Order #: [id number] Class:  Normal  . Order #: [id number] Class: Print    Allergies Other and Levofloxacin in d5w  Family History  Problem Relation Age of Onset  . Polycythemia Mother   . Diabetes Father   . Hypertension Father   . Heart disease Father   . CAD Unknown        1 of his 4 brothers and father has CAD    Social History Social History   Tobacco Use  . Smoking status: Former Smoker    Packs/day: 2.00    Years: 20.00    Pack years: 40.00    Types: Cigarettes    Last attempt to quit: 06/28/1985    Years since quitting: 33.0  . Smokeless tobacco: Former Systems developer    Quit date: 06/28/1985  Substance Use Topics  . Alcohol use: No  . Drug use: No    Review of Systems  All other systems negative except as documented in the HPI. All pertinent positives and negatives as reviewed in the HPI. ____________________________________________   PHYSICAL EXAM:  VITAL SIGNS: ED Triage Vitals [06/28/18 1612]  Enc  Vitals Group     BP (!) 121/56     Pulse Rate 61     Resp 20     Temp (!) 97.3 F (36.3 C)     Temp Source Oral     SpO2 98 %    Constitutional: Alert and oriented. Well appearing and in no acute distress. Eyes: Conjunctivae are normal. PERRL. EOMI. Head: Atraumatic. Nose: No congestion/rhinnorhea. Mouth/Throat: Mucous membranes are moist.  Oropharynx non-erythematous. Neck: No stridor.  No meningeal signs.   Cardiovascular: Normal rate, regular rhythm. Good peripheral circulation. Grossly normal heart sounds.   Respiratory: tachypneic respiratory effort.  No retractions. Lungs CTAB. Gastrointestinal: Soft and nontender. No distention.  Musculoskeletal: No lower extremity tenderness but has bilateral edema above knees with unna boots in place. No gross deformities of extremities. Neurologic:  Normal speech and language. No gross focal neurologic deficits are appreciated.  Skin:  Skin is warm, dry and intact. No rash noted.   ____________________________________________   LABS (all  labs ordered are listed, but only abnormal results are displayed)  Labs Reviewed  CBC WITH DIFFERENTIAL/PLATELET - Abnormal; Notable for the following components:      Result Value   Hemoglobin 11.6 (*)    MCHC 28.1 (*)    RDW 16.5 (*)    Lymphs Abs 0.6 (*)    All other components within normal limits  COMPREHENSIVE METABOLIC PANEL - Abnormal; Notable for the following components:   Sodium 146 (*)    CO2 36 (*)    Glucose, Bld 217 (*)    BUN 51 (*)    Creatinine, Ser 1.78 (*)    GFR calc non Af Amer 36 (*)    GFR calc Af Amer 42 (*)    All other components within normal limits  TROPONIN I - Abnormal; Notable for the following components:   Troponin I 0.03 (*)    All other components within normal limits  BRAIN NATRIURETIC PEPTIDE - Abnormal; Notable for the following components:   B Natriuretic Peptide 319.2 (*)    All other components within normal limits  I-STAT VENOUS BLOOD GAS, ED - Abnormal; Notable for the following components:   pCO2, Ven 87.4 (*)    pO2, Ven 63.0 (*)    Bicarbonate 40.2 (*)    TCO2 43 (*)    Acid-Base Excess 10.0 (*)    All other components within normal limits  I-STAT ARTERIAL BLOOD GAS, ED - Abnormal; Notable for the following components:   pH, Arterial 7.310 (*)    pCO2 arterial 78.0 (*)    Bicarbonate 39.4 (*)    TCO2 42 (*)    Acid-Base Excess 10.0 (*)    All other components within normal limits  CBG MONITORING, ED - Abnormal; Notable for the following components:   Glucose-Capillary 183 (*)    All other components within normal limits  URINE CULTURE  LACTIC ACID, PLASMA  URINALYSIS, ROUTINE W REFLEX MICROSCOPIC  AMMONIA  BASIC METABOLIC PANEL  AMMONIA  FOLATE RBC  HIV ANTIBODY (ROUTINE TESTING)  RPR  TSH  VITAMIN B12  TROPONIN I  TROPONIN I  TROPONIN I   ____________________________________________  EKG   EKG Interpretation  Date/Time:  Monday June 28 2018 16:10:22 EDT Ventricular Rate:  58 PR Interval:    QRS  Duration: 155 QT Interval:  489 QTC Calculation: 481 R Axis:   40 Text Interpretation:  Sinus rhythm Ventricular premature complex Right bundle branch block similar to october 2018 Confirmed by Merrily Pew [id number])  on 06/28/2018 5:44:56 PM       ____________________________________________  RADIOLOGY  Ct Abdomen Pelvis Wo Contrast  Result Date: 06/28/2018 CLINICAL DATA:  Weakness and fatigue. Patient diagnosed with the urinary tract infection 06/25/2018. EXAM: CT ABDOMEN AND PELVIS WITHOUT CONTRAST TECHNIQUE: Multidetector CT imaging of the abdomen and pelvis was performed following the standard protocol without IV contrast. COMPARISON:  CT abdomen and pelvis 08/22/2015. FINDINGS: Lower chest: There is cardiomegaly. Calcific aortic and coronary atherosclerosis is identified. Dependent atelectasis is seen in the lung bases. Hepatobiliary: No focal liver abnormality is seen. Status post cholecystectomy. No biliary dilatation. Pancreas: Unremarkable. No pancreatic ductal dilatation or surrounding inflammatory changes. Fatty atrophy is noted Spleen: Normal in size without focal abnormality. Adrenals/Urinary Tract: Adrenal glands are unremarkable. Kidneys are normal, without renal calculi, focal lesion, or hydronephrosis. Bladder is unremarkable. Stomach/Bowel: Stomach is within normal limits. Appendix appears normal. No evidence of bowel wall thickening, distention, or inflammatory changes. Vascular/Lymphatic: Aortic atherosclerosis. No enlarged abdominal or pelvic lymph nodes. Reproductive: The patient appears to be status post TURP. Other: No fluid collection. Musculoskeletal: The patient is status post T7-10 and L4-5 fusion. Advanced multilevel spondylosis is identified. No worrisome lesion. No acute abnormality. IMPRESSION: No acute abnormality abdomen or pelvis. Cardiomegaly. Calcific aortic and coronary atherosclerosis. Advanced multilevel thoracic and lumbar spondylosis with postoperative change  noted. Electronically Signed   By: Inge Rise M.D.   On: 06/28/2018 20:12   Dg Chest 2 View  Result Date: 06/28/2018 CLINICAL DATA:  Weakness and fatigue.  COPD.  CHF. EXAM: CHEST - 2 VIEW COMPARISON:  05/06/2018. FINDINGS: Midthoracic fusion apparatus appears stable. The heart is enlarged. There is a considerable amount of air under the RIGHT hemidiaphragm concerning for pneumoperitoneum. Colonic interposition could have this appearance, but this is a new finding from previous. A lateral decubitus film is recommended for further evaluation. Alternately, noncontrast CT could be performed. BILATERAL pulmonary opacities are seen representing vascular congestion, similar aeration to priors. Bibasilar atelectasis is superimposed. IMPRESSION: Considerable amount of air under the RIGHT hemidiaphragm concerning for pneumoperitoneum. Colonic interposition could have a similar appearance, and lateral decubitus film versus noncontrast CT of the abdomen and pelvis is recommended for further evaluation. A call has been placed to the ordering provider to directly communicate the findings. Electronically Signed   By: Staci Righter M.D.   On: 06/28/2018 18:20   Ct Head Wo Contrast  Result Date: 06/28/2018 CLINICAL DATA:  72 year old male with generalized weakness and altered mental status. EXAM: CT HEAD WITHOUT CONTRAST TECHNIQUE: Contiguous axial images were obtained from the base of the skull through the vertex without intravenous contrast. COMPARISON:  Cervical spine MRI 06/27/2005. FINDINGS: Brain: Cerebral volume is within normal limits for age. No midline shift, ventriculomegaly, mass effect, evidence of mass lesion, intracranial hemorrhage or evidence of cortically based acute infarction. Mild for age nonspecific white matter hypodensity. Otherwise normal gray-white matter differentiation with no cortical encephalomalacia identified. Vascular: Calcified atherosclerosis at the skull base. No suspicious  intracranial vascular hyperdensity. Skull: No skull fracture identified. Upper cervical spine degeneration including ligamentous hypertrophy about the odontoid resulting in upper cervical spinal stenosis (series 6, image 29). Carious maxillary dentition. Sinuses/Orbits: Multiple small maxillary sinus mucous retention cysts. Otherwise well pneumatized. Tympanic cavities and mastoids are clear. Other: Postoperative changes to both globes. No acute orbit or scalp soft tissue finding. IMPRESSION: 1. No acute intracranial abnormality. Mild for age nonspecific cerebral white matter changes. 2. 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 Electronically Signed  By: Genevie Ann M.D.   On: 06/28/2018 18:42    ____________________________________________   PROCEDURES  Procedure(s) performed:   Procedures  CRITICAL CARE Performed by: Merrily Pew Total critical care time: 35 minutes Critical care time was exclusive of separately billable procedures and treating other patients. Critical care was necessary to treat or prevent imminent or life-threatening deterioration. Critical care was time spent personally by me on the following activities: development of treatment plan with patient and/or surrogate as well as nursing, discussions with consultants, evaluation of patient's response to treatment, examination of patient, obtaining history from patient or surrogate, ordering and performing treatments and interventions, ordering and review of laboratory studies, ordering and review of radiographic studies, pulse oximetry and re-evaluation of patient's condition.  ____________________________________________   INITIAL IMPRESSION / ASSESSMENT AND PLAN / ED COURSE  Weakness possibly secondary to hypercarbia versus hypoxia versus metabolic causes may be from UTI.  Also could be uremic.  Will evaluate for the same but likely will need to be admitted.  I suspect patient symptoms ended up being from  hypercarbia.  Started on BiPAP with some improvement in his CO2 so will admit to medicine for the same.  May be hypercarbic from fluid overload versus other causes so he was given a dose of IV Lasix.     Pertinent labs & imaging results that were available during my care of the patient were reviewed by me and considered in my medical decision making (see chart for details).  ____________________________________________  FINAL CLINICAL IMPRESSION(S) / ED DIAGNOSES  Final diagnoses:  Hypercarbia  Altered mental status, unspecified altered mental status type     MEDICATIONS GIVEN DURING THIS VISIT:  Medications  aspirin EC tablet 81 mg (has no administration in time range)  ciprofloxacin (CIPRO) tablet 500 mg (0 mg Oral Hold 06/28/18 2217)  escitalopram (LEXAPRO) tablet 10 mg (has no administration in time range)  gabapentin (NEURONTIN) capsule 400 mg (has no administration in time range)  hydrALAZINE (APRESOLINE) tablet 50 mg (has no administration in time range)  levothyroxine (SYNTHROID, LEVOTHROID) tablet 175 mcg (has no administration in time range)  metoprolol tartrate (LOPRESSOR) tablet 50 mg (has no administration in time range)  pramipexole (MIRAPEX) tablet 0.5 mg (has no administration in time range)  senna (SENOKOT) tablet 17.2 mg (has no administration in time range)  tamsulosin (FLOMAX) capsule 0.4 mg (has no administration in time range)  fesoterodine (TOVIAZ) tablet 8 mg (0 mg Oral Hold 06/28/18 2217)  sodium chloride flush (NS) 0.9 % injection 3 mL (3 mLs Intravenous Given 06/28/18 2228)  sodium chloride flush (NS) 0.9 % injection 3 mL (has no administration in time range)  0.9 %  sodium chloride infusion (has no administration in time range)  acetaminophen (TYLENOL) tablet 650 mg (has no administration in time range)  ondansetron (ZOFRAN) injection 4 mg (has no administration in time range)  insulin aspart (novoLOG) injection 0-9 Units (2 Units Subcutaneous Given 06/28/18  2228)  heparin injection 5,000 Units (5,000 Units Subcutaneous Given 06/28/18 2228)  furosemide (LASIX) injection 60 mg (has no administration in time range)  albuterol (PROVENTIL) (2.5 MG/3ML) 0.083% nebulizer solution 2.5 mg (has no administration in time range)  furosemide (LASIX) injection 80 mg (80 mg Intravenous Given 06/28/18 2114)     NEW OUTPATIENT MEDICATIONS STARTED DURING THIS VISIT:  New Prescriptions   No medications on file    Note:  This note was prepared with assistance of Dragon voice recognition software. Occasional wrong-word or sound-a-like substitutions may have  occurred due to the inherent limitations of voice recognition software.   Aspin Palomarez, Corene Cornea, MD 06/29/18 [phone]

## 2018-06-28 NOTE — Progress Notes (Signed)
BiPAP ordered went to patients room patient currently in CT was on 2 lpm when he left.  I will follow up when patient returns.

## 2018-06-29 ENCOUNTER — Encounter (HOSPITAL_COMMUNITY): Payer: Self-pay | Admitting: General Practice

## 2018-06-29 ENCOUNTER — Other Ambulatory Visit: Payer: Self-pay

## 2018-06-29 ENCOUNTER — Inpatient Hospital Stay (HOSPITAL_COMMUNITY): Payer: Medicare Other

## 2018-06-29 ENCOUNTER — Encounter (HOSPITAL_COMMUNITY): Payer: Medicare Other

## 2018-06-29 DIAGNOSIS — R609 Edema, unspecified: Secondary | ICD-10-CM

## 2018-06-29 DIAGNOSIS — I5030 Unspecified diastolic (congestive) heart failure: Secondary | ICD-10-CM

## 2018-06-29 LAB — I-STAT ARTERIAL BLOOD GAS, ED
Acid-Base Excess: 11 mmol/L — ABNORMAL HIGH (ref 0.0–2.0)
Acid-Base Excess: 15 mmol/L — ABNORMAL HIGH (ref 0.0–2.0)
BICARBONATE: 42.3 mmol/L — AB (ref 20.0–28.0)
Bicarbonate: 39.7 mmol/L — ABNORMAL HIGH (ref 20.0–28.0)
O2 SAT: 91 %
O2 Saturation: 93 %
PCO2 ART: 72 mmHg — AB (ref 32.0–48.0)
PCO2 ART: 68.1 mmHg — AB (ref 32.0–48.0)
PH ART: 7.348 — AB (ref 7.350–7.450)
PO2 ART: 66 mmHg — AB (ref 83.0–108.0)
PO2 ART: 71 mmHg — AB (ref 83.0–108.0)
Patient temperature: 98
Patient temperature: 98
TCO2: 42 mmol/L — AB (ref 22–32)
TCO2: 44 mmol/L — ABNORMAL HIGH (ref 22–32)
pH, Arterial: 7.4 (ref 7.350–7.450)

## 2018-06-29 LAB — BASIC METABOLIC PANEL
Anion gap: 8 (ref 5–15)
BUN: 50 mg/dL — ABNORMAL HIGH (ref 8–23)
CO2: 37 mmol/L — ABNORMAL HIGH (ref 22–32)
Calcium: 8.7 mg/dL — ABNORMAL LOW (ref 8.9–10.3)
Chloride: 101 mmol/L (ref 98–111)
Creatinine, Ser: 1.61 mg/dL — ABNORMAL HIGH (ref 0.61–1.24)
GFR, EST AFRICAN AMERICAN: 47 mL/min — AB (ref >60)
GFR, EST NON AFRICAN AMERICAN: 41 mL/min — AB (ref >60)
Glucose, Bld: 156 mg/dL — ABNORMAL HIGH (ref 70–99)
POTASSIUM: 4.3 mmol/L (ref 3.5–5.1)
SODIUM: 146 mmol/L — AB (ref 135–145)

## 2018-06-29 LAB — VITAMIN B12: Vitamin B-12: 394 pg/mL (ref 180–914)

## 2018-06-29 LAB — HIV ANTIBODY (ROUTINE TESTING W REFLEX): HIV Screen 4th Generation wRfx: NONREACTIVE

## 2018-06-29 LAB — CBG MONITORING, ED
Glucose-Capillary: 124 mg/dL — ABNORMAL HIGH (ref 70–99)
Glucose-Capillary: 137 mg/dL — ABNORMAL HIGH (ref 70–99)
Glucose-Capillary: 148 mg/dL — ABNORMAL HIGH (ref 70–99)
Glucose-Capillary: 149 mg/dL — ABNORMAL HIGH (ref 70–99)

## 2018-06-29 LAB — TROPONIN I
TROPONIN I: 0.03 ng/mL — AB (ref <0.03)
Troponin I: 0.03 ng/mL (ref <0.03)
Troponin I: 0.03 ng/mL (ref <0.03)

## 2018-06-29 LAB — GLUCOSE, CAPILLARY
GLUCOSE-CAPILLARY: 170 mg/dL — AB (ref 70–99)
GLUCOSE-CAPILLARY: 172 mg/dL — AB (ref 70–99)

## 2018-06-29 LAB — TSH: TSH: 1.171 u[IU]/mL (ref 0.350–4.500)

## 2018-06-29 LAB — RPR: RPR: NONREACTIVE

## 2018-06-29 LAB — MRSA PCR SCREENING: MRSA by PCR: NEGATIVE

## 2018-06-29 LAB — ECHOCARDIOGRAM COMPLETE

## 2018-06-29 LAB — AMMONIA: Ammonia: 36 umol/L — ABNORMAL HIGH (ref 9–35)

## 2018-06-29 MED ORDER — PERFLUTREN LIPID MICROSPHERE
1.0000 mL | INTRAVENOUS | Status: AC | PRN
  Administered 2018-06-29: 2 mL via INTRAVENOUS
  Filled 2018-06-29: qty 10

## 2018-06-29 MED ORDER — INSULIN ASPART 100 UNIT/ML ~~LOC~~ SOLN
0.0000 [IU] | Freq: Three times a day (TID) | SUBCUTANEOUS | Status: DC
  Administered 2018-06-30: 3 [IU] via SUBCUTANEOUS
  Administered 2018-06-30: 1 [IU] via SUBCUTANEOUS
  Administered 2018-06-30: 2 [IU] via SUBCUTANEOUS
  Administered 2018-07-01: 3 [IU] via SUBCUTANEOUS

## 2018-06-29 NOTE — ED Notes (Signed)
Patient taken off of BiPAP and given a sip of water. Placed back on BiPAP.

## 2018-06-29 NOTE — ED Notes (Signed)
ED TO INPATIENT HANDOFF REPORT  Name/Age/Gender Travis [name] 72 y.o. male  Code Status    Code Status Orders  (From admission, onward)        Start     Ordered   06/28/18 2144  Full code  Continuous     06/28/18 2146    Code Status History    Date Active Date Inactive Code Status Order ID Comments User Context   04/16/2018 1515 05/12/2018 1931 Full Code [id number]  Elizabeth Sauer Inpatient   04/16/2018 1515 04/16/2018 1515 Full Code [id number]  [name], [name] Inpatient   04/08/2018 1733 04/16/2018 1504 Full Code [id number]  Eustace Moore, MD Inpatient   11/12/2017 1404 11/13/2017 0310 Full Code [id number]  Logan Bores, MD HOV   10/08/2017 1547 10/20/2017 1413 Full Code [id number]  Bary Leriche, [name] Inpatient   10/02/2017 1549 10/08/2017 1539 Full Code [id number]  Kinnie Feil, MD ED   03/26/2017 2315 04/01/2017 1622 Full Code [id number]  Norval Morton, MD Inpatient   09/07/2015 1230 09/07/2015 1956 Full Code [id number]  Martinique, Peter M, MD Inpatient   08/19/2015 2101 08/23/2015 1628 Full Code [id number]  Theressa Millard, MD Inpatient   07/18/2014 1825 08/09/2014 1422 Full Code [id number]  Flora Lipps Inpatient   07/10/2014 1643 07/18/2014 1825 Full Code [id number]  Jonetta Osgood, MD Inpatient      Home/SNF/Other Home  Chief Complaint Weakness; UTI  Level of Care/Admitting Diagnosis ED Disposition    ED Disposition Condition [place]: [place] [[id number]]  Level of Care: Stepdown [14]  Diagnosis: Acute encephalopathy [[id number]]  Admitting Physician: Vianne Bulls [[id number]]  Attending Physician: Vianne Bulls [[id number]]  Estimated length of stay: past midnight tomorrow  Certification:: I certify this patient will need inpatient services for at least 2 midnights  PT Class (Do Not Modify): Inpatient [101]  PT Acc Code (Do Not Modify): Private [1]       Medical History Past Medical History:  Diagnosis  Date  . Cervical myelopathy (HCC)    with myelomalacia at C5-6 and C3-4  . CHF (congestive heart failure) ([place])   . Constipation - functional    controlled with miralax  . COPD (chronic obstructive pulmonary disease) (HCC)    mild  . DJD (degenerative joint disease)    WHOLE SPINE  . Dyspnea   . Gait disorder   . Heart murmur    years ago   . Hypertension   . Hypothyroidism    Low d/t treatment of hyperthyroidism  . Lumbosacral spinal stenosis 03/13/2014  . Peripheral neuropathy   . Pneumonia   . PONV (postoperative nausea and vomiting)   . RBBB   . Renal disorder    acute kidney failure  . Restless legs syndrome 03/14/2015  . RLS (restless legs syndrome)   . Sleep apnea    DX  2009/2010 [facility] done at [facility]  . 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 Type II diabetes mellitus (HCC)    diet controlled    Allergies Allergies  Allergen Reactions  . Other Other (See Comments)    ANESTHESIA UNSPECIFIED  -- makes sick, must have antinausea medication in advance.  . Levofloxacin In D5w Itching    Possible infusion reaction 03/29/17. Tolerating oral Levaquin 03/30/17    IV Location/Drains/Wounds Patient Lines/Drains/Airways Status   Active Line/Drains/Airways    Name:   Placement date:   Placement  time:   Site:   Days:   Peripheral IV 06/28/18 Left Antecubital   06/28/18    1643    Antecubital   1   Incision (Closed) 04/08/18 Back Other (Comment)   04/08/18    1128     82   Incision (Closed) 04/08/18 Back   04/08/18    1308     82   Pressure Ulcer 07/27/14 Stage II -  Partial thickness loss of dermis presenting as a shallow open ulcer with a red, pink wound bed without slough.   07/27/14    1000     1433          Labs/Imaging Results for orders placed or performed during the hospital encounter of 06/28/18 (from the past 48 hour(s))  CBC with Differential     Status: Abnormal   Collection Time: 06/28/18  5:31 PM  Result Value Ref Range   WBC 4.5 4.0 - 10.5 K/uL   RBC  4.25 4.22 - 5.81 MIL/uL   Hemoglobin 11.6 (L) 13.0 - 17.0 g/dL   HCT 41.3 39.0 - 52.0 %   MCV 97.2 78.0 - 100.0 fL   MCH 27.3 26.0 - 34.0 pg   MCHC 28.1 (L) 30.0 - 36.0 g/dL   RDW 16.5 (H) 11.5 - 15.5 %   Platelets 202 150 - 400 K/uL   Neutrophils Relative % 79 %   Neutro Abs 3.5 1.7 - 7.7 K/uL   Lymphocytes Relative 12 %   Lymphs Abs 0.6 (L) 0.7 - 4.0 K/uL   Monocytes Relative 8 %   Monocytes Absolute 0.3 0.1 - 1.0 K/uL   Eosinophils Relative 1 %   Eosinophils Absolute 0.1 0.0 - 0.7 K/uL   Basophils Relative 0 %   Basophils Absolute 0.0 0.0 - 0.1 K/uL   Immature Granulocytes 0 %   Abs Immature Granulocytes 0.0 0.0 - 0.1 K/uL    Comment: Performed at [facility], [address] [place], [place] [address]  Lactic acid, plasma     Status: None   Collection Time: 06/28/18  5:44 PM  Result Value Ref Range   Lactic Acid, Venous 0.9 0.5 - 1.9 mmol/L    Comment: Performed at [facility] [address], [place], [place] [address]  Urinalysis, Routine w reflex microscopic     Status: None   Collection Time: 06/28/18  5:44 PM  Result Value Ref Range   Color, Urine YELLOW YELLOW   APPearance CLEAR CLEAR   Specific Gravity, Urine 1.010 1.005 - 1.030   pH 5.0 5.0 - 8.0   Glucose, UA NEGATIVE NEGATIVE mg/dL   Hgb urine dipstick NEGATIVE NEGATIVE   Bilirubin Urine NEGATIVE NEGATIVE   Ketones, ur NEGATIVE NEGATIVE mg/dL   Protein, ur NEGATIVE NEGATIVE mg/dL   Nitrite NEGATIVE NEGATIVE   Leukocytes, UA NEGATIVE NEGATIVE    Comment: Performed at [facility] [address], [place], [place] [address]  Ammonia     Status: None   Collection Time: 06/28/18  5:44 PM  Result Value Ref Range   Ammonia 29 9 - 35 umol/L    Comment: Performed at [facility], [place] [address], [place],  [address]  I-Stat venous blood gas, ED     Status: Abnormal   Collection Time: 06/28/18  7:33 PM  Result Value Ref Range   pH, Ven 7.271 7.250 - 7.430   pCO2, Ven 87.4 (HH)  44.0 - 60.0 mmHg   pO2, Ven  63.0 (H) 32.0 - 45.0 mmHg   Bicarbonate 40.2 (H) 20.0 - 28.0 mmol/L   TCO2 43 (H) 22 - 32 mmol/L   O2 Saturation 87.0 %   Acid-Base Excess 10.0 (H) 0.0 - 2.0 mmol/L   Patient temperature HIDE    Sample type VENOUS    Comment NOTIFIED PHYSICIAN   Comprehensive metabolic panel     Status: Abnormal   Collection Time: 06/28/18  7:49 PM  Result Value Ref Range   Sodium 146 (H) 135 - 145 mmol/L   Potassium 4.7 3.5 - 5.1 mmol/L   Chloride 99 98 - 111 mmol/L    Comment: Please note change in reference range.   CO2 36 (H) 22 - 32 mmol/L   Glucose, Bld 217 (H) 70 - 99 mg/dL    Comment: Please note change in reference range.   BUN 51 (H) 8 - 23 mg/dL    Comment: Please note change in reference range.   Creatinine, Ser 1.78 (H) 0.61 - 1.24 mg/dL   Calcium 9.1 8.9 - 10.3 mg/dL   Total Protein 7.7 6.5 - 8.1 g/dL   Albumin 3.8 3.5 - 5.0 g/dL   AST 21 15 - 41 U/L   ALT 26 0 - 44 U/L    Comment: Please note change in reference range.   Alkaline Phosphatase 87 38 - 126 U/L   Total Bilirubin 0.8 0.3 - 1.2 mg/dL   GFR calc non Af Amer 36 (L) >60 mL/min   GFR calc Af Amer 42 (L) >60 mL/min    Comment: (NOTE) The eGFR has been calculated using the CKD EPI equation. This calculation has not been validated in all clinical situations. eGFR's persistently <60 mL/min signify possible Chronic Kidney Disease.    Anion gap 11 5 - 15    Comment: Performed at [facility] [address] [place], [place] [address]  Troponin I     Status: Abnormal   Collection Time: 06/28/18  7:49 PM  Result Value Ref Range   Troponin I 0.03 (HH) <0.03 ng/mL    Comment: CRITICAL RESULT CALLED TO, READ BACK BY AND VERIFIED WITH: Nash Mantis RN [id number] 2021 GREEN R Performed at [facility] [address], [place], [place] [address]   Brain natriuretic peptide     Status: Abnormal   Collection Time: 06/28/18  7:52 PM  Result Value Ref Range   B Natriuretic Peptide 319.2 (H) 0.0  - 100.0 pg/mL    Comment: Performed at [facility] [address] [place], [place] [address]  I-Stat arterial blood gas, ED     Status: Abnormal   Collection Time: 06/28/18  9:46 PM  Result Value Ref Range   pH, Arterial 7.310 (L) 7.350 - 7.450   pCO2 arterial 78.0 (HH) 32.0 - 48.0 mmHg   pO2, Arterial 85.0 83.0 - 108.0 mmHg   Bicarbonate 39.4 (H) 20.0 - 28.0 mmol/L   TCO2 42 (H) 22 - 32 mmol/L   O2 Saturation 95.0 %   Acid-Base Excess 10.0 (H) 0.0 - 2.0 mmol/L   Patient temperature 98.0 F    Collection site RADIAL, ALLEN'S TEST ACCEPTABLE    Drawn by RT    Sample type ARTERIAL    Comment NOTIFIED PHYSICIAN   CBG monitoring, ED     Status: Abnormal   Collection Time: 06/28/18 10:23 PM  Result Value Ref Range   Glucose-Capillary 183 (H) 70 - 99 mg/dL  CBG monitoring, ED  Status: Abnormal   Collection Time: 06/29/18 12:19 AM  Result Value Ref Range   Glucose-Capillary 149 (H) 70 - 99 mg/dL  Troponin I     Status: Abnormal   Collection Time: 06/29/18  1:27 AM  Result Value Ref Range   Troponin I 0.03 (HH) <0.03 ng/mL    Comment: CRITICAL VALUE NOTED.  VALUE IS CONSISTENT WITH PREVIOUSLY REPORTED AND CALLED VALUE. Performed at [facility], [place] [address], [place], [place] [address]   I-Stat arterial blood gas, ED     Status: Abnormal   Collection Time: 06/29/18  1:29 AM  Result Value Ref Range   pH, Arterial 7.348 (L) 7.350 - 7.450   pCO2 arterial 72.0 (HH) 32.0 - 48.0 mmHg   pO2, Arterial 66.0 (L) 83.0 - 108.0 mmHg   Bicarbonate 39.7 (H) 20.0 - 28.0 mmol/L   TCO2 42 (H) 22 - 32 mmol/L   O2 Saturation 91.0 %   Acid-Base Excess 11.0 (H) 0.0 - 2.0 mmol/L   Patient temperature 98.0 F    Collection site RADIAL, ALLEN'S TEST ACCEPTABLE    Drawn by RT    Sample type ARTERIAL    Comment NOTIFIED PHYSICIAN   Basic metabolic panel     Status: Abnormal   Collection Time: 06/29/18  3:14 AM  Result Value Ref Range   Sodium 146 (H) 135 - 145 mmol/L   Potassium 4.3  3.5 - 5.1 mmol/L   Chloride 101 98 - 111 mmol/L    Comment: Please note change in reference range.   CO2 37 (H) 22 - 32 mmol/L   Glucose, Bld 156 (H) 70 - 99 mg/dL    Comment: Please note change in reference range.   BUN 50 (H) 8 - 23 mg/dL    Comment: Please note change in reference range.   Creatinine, Ser 1.61 (H) 0.61 - 1.24 mg/dL   Calcium 8.7 (L) 8.9 - 10.3 mg/dL   GFR calc non Af Amer 41 (L) >60 mL/min   GFR calc Af Amer 47 (L) >60 mL/min    Comment: (NOTE) The eGFR has been calculated using the CKD EPI equation. This calculation has not been validated in all clinical situations. eGFR's persistently <60 mL/min signify possible Chronic Kidney Disease.    Anion gap 8 5 - 15    Comment: Performed at [facility] [address], [place], [place] [address]  Ammonia     Status: Abnormal   Collection Time: 06/29/18  3:14 AM  Result Value Ref Range   Ammonia 36 (H) 9 - 35 umol/L    Comment: Performed at [facility], [place] [address], [place], [place] [address]  HIV antibody (routine testing) (NOT for [facility])     Status: None   Collection Time: 06/29/18  3:14 AM  Result Value Ref Range   HIV Screen 4th Generation wRfx Non Reactive Non Reactive    Comment: (NOTE) Performed At: [facility] [place], [place] [id number] Rush Farmer MD [id number] Performed at [facility], [place] [address] [place], [place] [address]   RPR     Status: None   Collection Time: 06/29/18  3:14 AM  Result Value Ref Range   RPR Ser Ql Non Reactive Non Reactive    Comment: (NOTE) Performed At: [facility] [place], [place] [id number] Rush Farmer MD [id number] Performed at [facility], [place] [address] [place], [place] [address]   TSH  Status: None   Collection Time: 06/29/18  3:14 AM  Result Value Ref Range   TSH 1.171 0.350 - 4.500 uIU/mL    Comment: Performed by a 3rd Generation assay with a functional sensitivity  of <=0.01 uIU/mL. Performed at [facility], [place] [address] [place], [place] [address]   Vitamin B12     Status: None   Collection Time: 06/29/18  3:14 AM  Result Value Ref Range   Vitamin B-12 394 180 - 914 pg/mL    Comment: (NOTE) This assay is not validated for testing neonatal or myeloproliferative syndrome specimens for Vitamin B12 levels. Performed at [facility], [place] [address], [place], [place] [address]   CBG monitoring, ED     Status: Abnormal   Collection Time: 06/29/18  4:09 AM  Result Value Ref Range   Glucose-Capillary 137 (H) 70 - 99 mg/dL  I-Stat arterial blood gas, ED     Status: Abnormal   Collection Time: 06/29/18  4:48 AM  Result Value Ref Range   pH, Arterial 7.400 7.350 - 7.450   pCO2 arterial 68.1 (HH) 32.0 - 48.0 mmHg   pO2, Arterial 71.0 (L) 83.0 - 108.0 mmHg   Bicarbonate 42.3 (H) 20.0 - 28.0 mmol/L   TCO2 44 (H) 22 - 32 mmol/L   O2 Saturation 93.0 %   Acid-Base Excess 15.0 (H) 0.0 - 2.0 mmol/L   Patient temperature 98.0 F    Collection site RADIAL, ALLEN'S TEST ACCEPTABLE    Drawn by RT    Sample type ARTERIAL    Comment NOTIFIED PHYSICIAN   Troponin I     Status: Abnormal   Collection Time: 06/29/18  7:23 AM  Result Value Ref Range   Troponin I 0.03 (HH) <0.03 ng/mL    Comment: CRITICAL VALUE NOTED.  VALUE IS CONSISTENT WITH PREVIOUSLY REPORTED AND CALLED VALUE. Performed at [facility], [place] [address] [place], [place] [address]   CBG monitoring, ED     Status: Abnormal   Collection Time: 06/29/18  7:30 AM  Result Value Ref Range   Glucose-Capillary 148 (H) 70 - 99 mg/dL   Comment 1 Notify RN    Comment 2 Document in Chart   CBG monitoring, ED     Status: Abnormal   Collection Time: 06/29/18 12:19 PM  Result Value Ref Range   Glucose-Capillary 124 (H) 70 - 99 mg/dL   Comment 1 Notify RN   Troponin I     Status: Abnormal   Collection Time: 06/29/18 12:35 PM  Result Value Ref Range   Troponin I 0.03 (HH) <0.03 ng/mL     Comment: CRITICAL VALUE NOTED.  VALUE IS CONSISTENT WITH PREVIOUSLY REPORTED AND CALLED VALUE. Performed at [facility], [place] [address] [place], [place] [address]    Ct Abdomen Pelvis Wo Contrast  Result Date: 06/28/2018 CLINICAL DATA:  Weakness and fatigue. Patient diagnosed with the urinary tract infection 06/25/2018. EXAM: CT ABDOMEN AND PELVIS WITHOUT CONTRAST TECHNIQUE: Multidetector CT imaging of the abdomen and pelvis was performed following the standard protocol without IV contrast. COMPARISON:  CT abdomen and pelvis 08/22/2015. FINDINGS: Lower chest: There is cardiomegaly. Calcific aortic and coronary atherosclerosis is identified. Dependent atelectasis is seen in the lung bases. Hepatobiliary: No focal liver abnormality is seen. Status post cholecystectomy. No biliary dilatation. Pancreas: Unremarkable. No pancreatic ductal dilatation or surrounding inflammatory changes. Fatty atrophy is noted Spleen: Normal in size without focal abnormality. Adrenals/Urinary Tract: Adrenal glands are unremarkable. Kidneys  are normal, without renal calculi, focal lesion, or hydronephrosis. Bladder is unremarkable. Stomach/Bowel: Stomach is within normal limits. Appendix appears normal. No evidence of bowel wall thickening, distention, or inflammatory changes. Vascular/Lymphatic: Aortic atherosclerosis. No enlarged abdominal or pelvic lymph nodes. Reproductive: The patient appears to be status post TURP. Other: No fluid collection. Musculoskeletal: The patient is status post T7-10 and L4-5 fusion. Advanced multilevel spondylosis is identified. No worrisome lesion. No acute abnormality. IMPRESSION: No acute abnormality abdomen or pelvis. Cardiomegaly. Calcific aortic and coronary atherosclerosis. Advanced multilevel thoracic and lumbar spondylosis with postoperative change noted. Electronically Signed   By: Inge Rise M.D.   On: 06/28/2018 20:12   Dg Chest 2 View  Result Date: 06/28/2018 CLINICAL  DATA:  Weakness and fatigue.  COPD.  CHF. EXAM: CHEST - 2 VIEW COMPARISON:  05/06/2018. FINDINGS: Midthoracic fusion apparatus appears stable. The heart is enlarged. There is a considerable amount of air under the RIGHT hemidiaphragm concerning for pneumoperitoneum. Colonic interposition could have this appearance, but this is a new finding from previous. A lateral decubitus film is recommended for further evaluation. Alternately, noncontrast CT could be performed. BILATERAL pulmonary opacities are seen representing vascular congestion, similar aeration to priors. Bibasilar atelectasis is superimposed. IMPRESSION: Considerable amount of air under the RIGHT hemidiaphragm concerning for pneumoperitoneum. Colonic interposition could have a similar appearance, and lateral decubitus film versus noncontrast CT of the abdomen and pelvis is recommended for further evaluation. A call has been placed to the ordering provider to directly communicate the findings. Electronically Signed   By: Staci Righter M.D.   On: 06/28/2018 18:20   Ct Head Wo Contrast  Result Date: 06/28/2018 CLINICAL DATA:  72 year old male with generalized weakness and altered mental status. EXAM: CT HEAD WITHOUT CONTRAST TECHNIQUE: Contiguous axial images were obtained from the base of the skull through the vertex without intravenous contrast. COMPARISON:  Cervical spine MRI 06/27/2005. FINDINGS: Brain: Cerebral volume is within normal limits for age. No midline shift, ventriculomegaly, mass effect, evidence of mass lesion, intracranial hemorrhage or evidence of cortically based acute infarction. Mild for age nonspecific white matter hypodensity. Otherwise normal gray-white matter differentiation with no cortical encephalomalacia identified. Vascular: Calcified atherosclerosis at the skull base. No suspicious intracranial vascular hyperdensity. Skull: No skull fracture identified. Upper cervical spine degeneration including ligamentous hypertrophy  about the odontoid resulting in upper cervical spinal stenosis (series 6, image 29). Carious maxillary dentition. Sinuses/Orbits: Multiple small maxillary sinus mucous retention cysts. Otherwise well pneumatized. Tympanic cavities and mastoids are clear. Other: Postoperative changes to both globes. No acute orbit or scalp soft tissue finding. IMPRESSION: 1. No acute intracranial abnormality. Mild for age nonspecific cerebral white matter changes. 2. 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 Electronically Signed   By: Genevie Ann M.D.   On: 06/28/2018 18:42    Pending Labs Unresulted Labs (From admission, onward)   Start     Ordered   06/29/18 2130  Basic metabolic panel  Daily,   R     06/28/18 2146   06/28/18 2217  Urine Culture  Add-on,   R     06/28/18 2216   06/28/18 2147  Folate RBC  Add-on,   R     06/28/18 2146      Vitals/Pain Today's Vitals   06/29/18 1130 06/29/18 1200 06/29/18 1230 06/29/18 1300  BP: (!) 149/65 (!) 155/63 (!) 147/58 (!) 155/63  Pulse: 64 66 64 64  Resp: (!) 21 (!) 23 (!) 23 (!) 22  Temp:  TempSrc:      SpO2: 90% 92% 92% 93%  PainSc:        Isolation Precautions No active isolations  Medications Medications  aspirin EC tablet 81 mg (81 mg Oral Given 06/29/18 1310)  ciprofloxacin (CIPRO) tablet 500 mg (500 mg Oral Given 06/29/18 1310)  escitalopram (LEXAPRO) tablet 10 mg (10 mg Oral Given 06/29/18 1310)  gabapentin (NEURONTIN) capsule 400 mg (400 mg Oral Given 06/29/18 1310)  hydrALAZINE (APRESOLINE) tablet 50 mg (50 mg Oral Given 06/29/18 1313)  levothyroxine (SYNTHROID, LEVOTHROID) tablet 175 mcg (0 mcg Oral Hold 06/29/18 0825)  metoprolol tartrate (LOPRESSOR) tablet 50 mg (50 mg Oral Given 06/29/18 1310)  pramipexole (MIRAPEX) tablet 0.5 mg (0.5 mg Oral Not Given 06/29/18 0923)  senna (SENOKOT) tablet 17.2 mg (17.2 mg Oral Given 06/29/18 1310)  tamsulosin (FLOMAX) capsule 0.4 mg (has no administration in time range)  fesoterodine (TOVIAZ) tablet 8 mg  (8 mg Oral Not Given 06/29/18 0924)  sodium chloride flush (NS) 0.9 % injection 3 mL (3 mLs Intravenous Given 06/29/18 1105)  sodium chloride flush (NS) 0.9 % injection 3 mL (has no administration in time range)  0.9 %  sodium chloride infusion (has no administration in time range)  acetaminophen (TYLENOL) tablet 650 mg (has no administration in time range)  ondansetron (ZOFRAN) injection 4 mg (has no administration in time range)  insulin aspart (novoLOG) injection 0-9 Units (1 Units Subcutaneous Given 06/29/18 1314)  heparin injection 5,000 Units (5,000 Units Subcutaneous Given 06/29/18 0626)  furosemide (LASIX) injection 60 mg (60 mg Intravenous Given 06/29/18 0626)  albuterol (PROVENTIL) (2.5 MG/3ML) 0.083% nebulizer solution 2.5 mg (has no administration in time range)  perflutren lipid microspheres (DEFINITY) IV suspension (2 mLs Intravenous Given 06/29/18 0953)  furosemide (LASIX) injection 80 mg (80 mg Intravenous Given 06/28/18 2114)    Mobility walks with device

## 2018-06-29 NOTE — ED Notes (Signed)
Patient tolerating nasal cannula well. Respiratory therapy made aware. Pt given small sips of water to drink.

## 2018-06-29 NOTE — ED Notes (Signed)
Patient transported to Ultrasound 

## 2018-06-29 NOTE — ED Notes (Signed)
Wife at bedside.

## 2018-06-29 NOTE — Progress Notes (Signed)
Preliminary results by tech - Lower Ext. Venous Duplex Completed. Negative for deep vein thrombosis. Oda Cogan, BS, RDMS, RVT

## 2018-06-29 NOTE — Progress Notes (Signed)
Critical ABG CO2 value reported to DR. OPYD.  No changes to make at this time.

## 2018-06-29 NOTE — ED Notes (Signed)
Pt tolerating 3L [place] well.

## 2018-06-29 NOTE — ED Notes (Signed)
Pt asleep and resting comfortably. 

## 2018-06-29 NOTE — ED Notes (Signed)
Pt's cbg was 145, informed Michelle-RN.

## 2018-06-29 NOTE — ED Notes (Signed)
Pt's cbg was 124, informed Michelle-RN.

## 2018-06-29 NOTE — Progress Notes (Signed)
RT set up CPAP and placed on patient with 2L O2 bled into circuit. Patient tolerating well at this time. RT will monitor as needed. 

## 2018-06-29 NOTE — Progress Notes (Signed)
Obtained another arterial blood gas which continues to show decrease in CO2.  We are moving in the right direction with the BiPAP patient is more alert and talking more.  I explained to him he still is not at a value with his CO2 where we should remove it at this time.  Patient understands and normally wears CPAP to sleep anyhow.  Mask was remove and patient was given a small amount of water to drink.  His mouth is very dry.  I advised him we will allow him to have breaks in between and have a small sip of water or a few ice chips.  Patient is very understanding and appreciative for everything we do.

## 2018-06-29 NOTE — Progress Notes (Signed)
PROGRESS NOTE  Travis [name] HWE:[id number] DOB: [birth year] DOA: 06/28/2018 PCP: [name], MD  HPI/Recap of past 24 hours:  Patient is seen in the afternoon He is off bipap, he has made 4.8liter urine since admission.  He reports feeling better, aaox3, very pleasant  Assessment/Plan: Principal Problem:   Acute encephalopathy Active Problems:   OSA on CPAP   Hypothyroid   Elevated troponin I level   Urinary tract infection without hematuria   Stage 3 chronic kidney disease (HCC)   Acute on chronic respiratory failure with hypoxia and hypercapnia (HCC)   Benign essential HTN   Acute on chronic diastolic (congestive) heart failure (HCC)   Poorly controlled type 2 diabetes mellitus with peripheral neuropathy (HCC)  Metabolic encephalopathy due to CO2 retention/hypercarbic respiratory failure -much improved on BiPAP - he is now off bipap, will continue qhs.  Acute on chronic diastolic CHF, history of pulmonary hypertension -Has increased peripheral edema despite recent increase in torsemide, he also has home health RN placed wrap on his legs started two weeks ago, he reports wraps are changed twice a week -cxr "BILATERAL pulmonary opacities are seen representing vascular congestion, similar aeration to priors. Bibasilar atelectasis is superimposed." -BNP 319 -IV Lasix -Continue beta-blocker -Echocardiogram/venous doppler lower extremity, he has been on neurontin 400mg tid, could contribute to edema as well. -I have sent message to cardiology through epic, his regular cardiologist is Dr Cathie Olden, he reports his daughter in law is a cardiac nurse works on [employer] at [employer].  Coronary artery disease, cardiac cath in 2016, nonobstructive CAD -No chest pain -Troponin mild and flat at 0.03 -EKG with chronic right bundle branch block.  PVCs, no acute ST-T changes -Continue aspirin /beta-blocker  HTN: stable on lopressor/hydralazine, diuretics  Non-insulin-dependent type 2  diabetes -A1c 7.7% in March 2019 -Hold home oral meds glimepiride -SSI while in the hospital  CKD 3 -Creatinine at baseline, renal dosing meds -He was recently diagnosed with UTI on June 28, he was started on ciprofloxacin -Currently no urinary symptoms, UA obtained in the ED unremarkable, no fever no leukocytosis -Follow urine culture result  BPH on Flomax  Hypothyroidism, TSH 1.17, continue Synthroid  OSA on CPAP at night Body mass index is 37.2 kg/m.    Code Status: full  Family Communication: patient   Disposition Plan: not ready to discharge   Consultants:  cardiology  Procedures:  bipap  Antibiotics:  none   Objective: BP (!) 143/61   Pulse (!) 58   Temp (!) 97.3 F (36.3 C) (Oral)   Resp (!) 23   SpO2 95%   Intake/Output Summary (Last 24 hours) at 06/29/2018 0818 Last data filed at 06/29/2018 8938 Gross per 24 hour  Intake -  Output 2875 ml  Net -2875 ml   There were no vitals filed for this visit.  Exam: Patient is examined daily including today on 06/29/2018, exams remain the same as of yesterday except that has changed    General:  NAD  Cardiovascular: RRR  Respiratory: CTABL  Abdomen: Soft/ND/NT, positive BS  Musculoskeletal: bilateral lower extremity pitting Edema  Neuro: alert, oriented   Data Reviewed: Basic Metabolic Panel: Recent Labs  Lab 06/28/18 1949 06/29/18 0314  NA 146* 146*  K 4.7 4.3  CL 99 101  CO2 36* 37*  GLUCOSE 217* 156*  BUN 51* 50*  CREATININE 1.78* 1.61*  CALCIUM 9.1 8.7*   Liver Function Tests: Recent Labs  Lab 06/28/18 1949  AST 21  ALT 26  ALKPHOS  87  BILITOT 0.8  PROT 7.7  ALBUMIN 3.8   No results for input(s): LIPASE, AMYLASE in the last 168 hours. Recent Labs  Lab 06/28/18 1744 06/29/18 0314  AMMONIA 29 36*   CBC: Recent Labs  Lab 06/28/18 1731  WBC 4.5  NEUTROABS 3.5  HGB 11.6*  HCT 41.3  MCV 97.2  PLT 202   Cardiac Enzymes:   Recent Labs  Lab 06/28/18 1949  06/29/18 0127  TROPONINI 0.03* 0.03*   BNP (last 3 results) Recent Labs    04/10/18 0405 04/11/18 0407 06/28/18 1952  BNP 238.1* 120.9* 319.2*    ProBNP (last 3 results) No results for input(s): PROBNP in the last 8760 hours.  CBG: Recent Labs  Lab 06/28/18 2223 06/29/18 0019 06/29/18 0409 06/29/18 0730  GLUCAP 183* 149* 137* 148*    No results found for this or any previous visit (from the past 240 hour(s)).   Studies: Ct Abdomen Pelvis Wo Contrast  Result Date: 06/28/2018 CLINICAL DATA:  Weakness and fatigue. Patient diagnosed with the urinary tract infection 06/25/2018. EXAM: CT ABDOMEN AND PELVIS WITHOUT CONTRAST TECHNIQUE: Multidetector CT imaging of the abdomen and pelvis was performed following the standard protocol without IV contrast. COMPARISON:  CT abdomen and pelvis 08/22/2015. FINDINGS: Lower chest: There is cardiomegaly. Calcific aortic and coronary atherosclerosis is identified. Dependent atelectasis is seen in the lung bases. Hepatobiliary: No focal liver abnormality is seen. Status post cholecystectomy. No biliary dilatation. Pancreas: Unremarkable. No pancreatic ductal dilatation or surrounding inflammatory changes. Fatty atrophy is noted Spleen: Normal in size without focal abnormality. Adrenals/Urinary Tract: Adrenal glands are unremarkable. Kidneys are normal, without renal calculi, focal lesion, or hydronephrosis. Bladder is unremarkable. Stomach/Bowel: Stomach is within normal limits. Appendix appears normal. No evidence of bowel wall thickening, distention, or inflammatory changes. Vascular/Lymphatic: Aortic atherosclerosis. No enlarged abdominal or pelvic lymph nodes. Reproductive: The patient appears to be status post TURP. Other: No fluid collection. Musculoskeletal: The patient is status post T7-10 and L4-5 fusion. Advanced multilevel spondylosis is identified. No worrisome lesion. No acute abnormality. IMPRESSION: No acute abnormality abdomen or pelvis.  Cardiomegaly. Calcific aortic and coronary atherosclerosis. Advanced multilevel thoracic and lumbar spondylosis with postoperative change noted. Electronically Signed   By: Inge Rise M.D.   On: 06/28/2018 20:12   Dg Chest 2 View  Result Date: 06/28/2018 CLINICAL DATA:  Weakness and fatigue.  COPD.  CHF. EXAM: CHEST - 2 VIEW COMPARISON:  05/06/2018. FINDINGS: Midthoracic fusion apparatus appears stable. The heart is enlarged. There is a considerable amount of air under the RIGHT hemidiaphragm concerning for pneumoperitoneum. Colonic interposition could have this appearance, but this is a new finding from previous. A lateral decubitus film is recommended for further evaluation. Alternately, noncontrast CT could be performed. BILATERAL pulmonary opacities are seen representing vascular congestion, similar aeration to priors. Bibasilar atelectasis is superimposed. IMPRESSION: Considerable amount of air under the RIGHT hemidiaphragm concerning for pneumoperitoneum. Colonic interposition could have a similar appearance, and lateral decubitus film versus noncontrast CT of the abdomen and pelvis is recommended for further evaluation. A call has been placed to the ordering provider to directly communicate the findings. Electronically Signed   By: Staci Righter M.D.   On: 06/28/2018 18:20   Ct Head Wo Contrast  Result Date: 06/28/2018 CLINICAL DATA:  72 year old male with generalized weakness and altered mental status. EXAM: CT HEAD WITHOUT CONTRAST TECHNIQUE: Contiguous axial images were obtained from the base of the skull through the vertex without intravenous contrast. COMPARISON:  Cervical spine  MRI 06/27/2005. FINDINGS: Brain: Cerebral volume is within normal limits for age. No midline shift, ventriculomegaly, mass effect, evidence of mass lesion, intracranial hemorrhage or evidence of cortically based acute infarction. Mild for age nonspecific white matter hypodensity. Otherwise normal gray-white matter  differentiation with no cortical encephalomalacia identified. Vascular: Calcified atherosclerosis at the skull base. No suspicious intracranial vascular hyperdensity. Skull: No skull fracture identified. Upper cervical spine degeneration including ligamentous hypertrophy about the odontoid resulting in upper cervical spinal stenosis (series 6, image 29). Carious maxillary dentition. Sinuses/Orbits: Multiple small maxillary sinus mucous retention cysts. Otherwise well pneumatized. Tympanic cavities and mastoids are clear. Other: Postoperative changes to both globes. No acute orbit or scalp soft tissue finding. IMPRESSION: 1. No acute intracranial abnormality. Mild for age nonspecific cerebral white matter changes. 2. 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 Electronically Signed   By: Genevie Ann M.D.   On: 06/28/2018 18:42    Scheduled Meds: . aspirin EC  81 mg Oral Daily  . ciprofloxacin  500 mg Oral BID  . escitalopram  10 mg Oral Daily  . fesoterodine  8 mg Oral Daily  . furosemide  60 mg Intravenous Q12H  . gabapentin  400 mg Oral TID  . heparin  5,000 Units Subcutaneous Q8H  . hydrALAZINE  50 mg Oral Q8H  . insulin aspart  0-9 Units Subcutaneous Q4H  . levothyroxine  175 mcg Oral QAC breakfast  . metoprolol tartrate  50 mg Oral BID  . pramipexole  0.5 mg Oral BID  . senna  2 tablet Oral BID  . sodium chloride flush  3 mL Intravenous Q12H  . tamsulosin  0.4 mg Oral QPC supper    Continuous Infusions: . sodium chloride       Time spent: 43mins I have personally reviewed and interpreted on  06/29/2018 daily labs, tele strips, imagings as discussed above under date review session and assessment and plans.  I reviewed all nursing notes, vitals, pertinent old records  I have discussed plan of care as described above with RN , patient  on 06/29/2018   Florencia Reasons MD, PhD  [facility] Pager [phone]. If 7PM-7AM, please contact night-coverage at [email or web address], password  [facility] 06/29/2018, 8:18 AM  LOS: 1 day

## 2018-06-29 NOTE — Progress Notes (Signed)
2D Echocardiogram has been performed.  Travis [name] 06/29/2018, 9:44 AM

## 2018-06-30 ENCOUNTER — Encounter: Payer: Medicare Other | Admitting: Physical Medicine & Rehabilitation

## 2018-06-30 DIAGNOSIS — E1142 Type 2 diabetes mellitus with diabetic polyneuropathy: Secondary | ICD-10-CM

## 2018-06-30 DIAGNOSIS — N183 Chronic kidney disease, stage 3 (moderate): Secondary | ICD-10-CM

## 2018-06-30 DIAGNOSIS — I10 Essential (primary) hypertension: Secondary | ICD-10-CM

## 2018-06-30 DIAGNOSIS — E1165 Type 2 diabetes mellitus with hyperglycemia: Secondary | ICD-10-CM

## 2018-06-30 DIAGNOSIS — J449 Chronic obstructive pulmonary disease, unspecified: Secondary | ICD-10-CM

## 2018-06-30 LAB — BASIC METABOLIC PANEL
ANION GAP: 9 (ref 5–15)
BUN: 44 mg/dL — ABNORMAL HIGH (ref 8–23)
CALCIUM: 8.4 mg/dL — AB (ref 8.9–10.3)
CO2: 40 mmol/L — ABNORMAL HIGH (ref 22–32)
CREATININE: 1.56 mg/dL — AB (ref 0.61–1.24)
Chloride: 95 mmol/L — ABNORMAL LOW (ref 98–111)
GFR, EST AFRICAN AMERICAN: 49 mL/min — AB (ref >60)
GFR, EST NON AFRICAN AMERICAN: 42 mL/min — AB (ref >60)
Glucose, Bld: 121 mg/dL — ABNORMAL HIGH (ref 70–99)
Potassium: 3.8 mmol/L (ref 3.5–5.1)
Sodium: 144 mmol/L (ref 135–145)

## 2018-06-30 LAB — FOLATE RBC
Folate, Hemolysate: 471.5 ng/mL
Folate, RBC: 1395 ng/mL (ref >498)
Hematocrit: 33.8 % — ABNORMAL LOW (ref 37.5–51.0)

## 2018-06-30 LAB — GLUCOSE, CAPILLARY
GLUCOSE-CAPILLARY: 153 mg/dL — AB (ref 70–99)
Glucose-Capillary: 133 mg/dL — ABNORMAL HIGH (ref 70–99)
Glucose-Capillary: 227 mg/dL — ABNORMAL HIGH (ref 70–99)
Glucose-Capillary: 183 mg/dL — ABNORMAL HIGH (ref 70–99)

## 2018-06-30 MED ORDER — ORAL CARE MOUTH RINSE
15.0000 mL | Freq: Two times a day (BID) | OROMUCOSAL | Status: DC
  Administered 2018-07-01 – 2018-07-03 (×5): 15 mL via OROMUCOSAL

## 2018-06-30 MED ORDER — FUROSEMIDE 10 MG/ML IJ SOLN
60.0000 mg | Freq: Two times a day (BID) | INTRAMUSCULAR | Status: DC
  Administered 2018-06-30 – 2018-07-01 (×2): 60 mg via INTRAVENOUS
  Filled 2018-06-30 (×2): qty 6

## 2018-06-30 MED ORDER — METOPROLOL TARTRATE 25 MG PO TABS
25.0000 mg | ORAL_TABLET | Freq: Two times a day (BID) | ORAL | Status: DC
  Administered 2018-06-30 – 2018-07-04 (×8): 25 mg via ORAL
  Filled 2018-06-30 (×8): qty 1

## 2018-06-30 NOTE — Clinical Social Work Note (Signed)
Clinical Social Work Assessment  Patient Details  Name: Travis [name] MRN: [id number] Date of Birth: [birth year]  Date of referral:  06/30/18               Reason for consult:  Facility Placement                Permission sought to share information with:  Facility Sport and exercise psychologist, Family Supports Permission granted to share information::  Yes, Verbal Permission Granted  Name::     Travis [name]  Agency::  SNF  Relationship::  dtr, wife  Contact Information:     Housing/Transportation Living arrangements for the past 2 months:  Single Family Home Source of Information:  Patient, Adult Children Patient Interpreter Needed:  None Criminal Activity/Legal Involvement Pertinent to Current Situation/Hospitalization:  No - Comment as needed Significant Relationships:  Adult Children, Spouse Lives with:  Adult Children, Spouse Do you feel safe going back to the place where you live?  No Need for family participation in patient care:  Yes (Comment)(needed help with ADLs at home prior to admission )  Care giving concerns:  Pt lives at home with wife and adult son.  Son works 12 hours shifts so most care falls to pt wife who cannot manage given pt increase in weakness prior to admission.   Social Worker assessment / plan:  CSW met with pt and pt dtr Travis [name] at their request.  Per Travis [name] pt was WC bound due to spinal injury since December then got back surgery in the spring.  After surgery pt went to CIR for 21 days where his mobility increased greatly and he was able to assist with transfer with 1 person assist and walk a few steps with rolling walker.  PT has been at home and doing well till recently when he had decline and now needing two people to assist which is not available at home- home services are not enough to accommodate pt needs.   CSW discussed DC options including SNF.  Pt has not been to SNF before but they are familiar with it from acquaintances.  CSW explained SNF  referral process and insurance authorization process.  Employment status:  Retired Nurse, adult PT Recommendations:  [place] / Referral to community resources:  [employer]  Patient/Family's Response to care:  Pt and dtr agreeable to short term SNF to help pt regain more mobility so he can be managed at home- hopeful for placement near home.  Patient/Family's Understanding of and Emotional Response to Diagnosis, Current Treatment, and Prognosis: Pt somewhat quiet during interview but acknowledged what CSW was saying- dtr very involved and expressed good understanding of pt condition- hopeful that he will be well enough to return home soon.  Emotional Assessment Appearance:  Appears stated age Attitude/Demeanor/Rapport:    Affect (typically observed):  Appropriate Orientation:  Oriented to Situation, Oriented to  Time, Oriented to Place, Oriented to Self Alcohol / Substance use:  Not Applicable Psych involvement (Current and /or in the community):  No (Comment)  Discharge Needs  Concerns to be addressed:  Care Coordination Readmission within the last 30 days:  No Current discharge risk:  Physical Impairment Barriers to Discharge:  Continued Medical Work up   Jorge Ny, LCSW 06/30/2018, 12:38 PM

## 2018-06-30 NOTE — Consult Note (Addendum)
[place] Nurse wound consult note Reason for Consult:Patient had vascular studies done to rule out DVT and Unna boots need to be re applied.  Patient has weekly Unna boots.  No open wounds to bilateral lower legs.  Will request ortho tech apply. Right great toe with traumatic wound.  Wound type:Right toe trauma Pressure Injury POA: NA Measurement:right toenail is missing. Xeroform and dry bandage daily.  Wound LAG:TXMI Drainage (amount, consistency, odor) minimal serosanguinous Periwound:intact Dressing procedure/placement/frequency: Cleanse right great toe with NS. Apply Xeroform to nail bed.  Cover with dry dressing and tape.  Change daily.  Will not follow at this time.  Please re-consult if needed.  Domenic Moras RN BSN [place] Pager [phone]

## 2018-06-30 NOTE — Progress Notes (Signed)
PROGRESS NOTE    Travis [name]  JAS:[id number] DOB: 10-22-1946 DOA: 06/28/2018 PCP: [name], MD   Brief Narrative:  Travis [name] is Travis [name] 72 y.o. male with medical history significant for type 2 diabetes mellitus, hypothyroidism, hypertension, chronic kidney disease stage III, COPD, and chronic hypoxic respiratory failure, now presenting to the emergency department for evaluation of somnolence and confusion.  History is obtained primarily from the patient's wife and daughter at the bedside as well as EMR and discussion with ED personnel.  Patient had back surgery in April and has reportedly been more fatigued since that time, but this has progressed significantly over the past week or so, over which interval he has been sleeping most of the day and seems to be mildly confused at times.  He has been noted to wake from sleep acutely short of breath multiple times every night for the past few days.  He has had progressive bilateral lower extremity swelling and recently had diuretics increased for this reason, but with no appreciable improvement.  He was diagnosed with UTI on 06/25/2018 and started on ciprofloxacin.  His condition has not improved with treatment of this.  He denies chest pain, fevers, or chills.   Assessment & Plan:   Principal Problem:   Acute encephalopathy Active Problems:   OSA on CPAP   Hypothyroid   Elevated troponin I level   Urinary tract infection without hematuria   Stage 3 chronic kidney disease (HCC)   Acute on chronic respiratory failure with hypoxia and hypercapnia (HCC)   Benign essential HTN   Acute on chronic diastolic (congestive) heart failure (HCC)   Poorly controlled type 2 diabetes mellitus with peripheral neuropathy (HCC)   Metabolic encephalopathy due to CO2 retention/hypercarbic respiratory failure - continue BIPAP nightly - c/s care management for BIPAP at home - normal B12, folate, RPR, TSH.  Ammonia very mildly elevated.  Acute Hypoxic  Hypercarbic Respiratory Failure: Presented with hypercarbia and now on 5 L by [place] (on 2 L by [place] at home) Diuresing as noted below Follows with pulm as outpatient.  Severe obstructive disease as well as severe restriction.  Will continue diuresis as noted below and consider pulmonology consult  Acute on chronic diastolic CHF, history of pulmonary hypertension - Has increased peripheral edema despite recent increase in torsemide, he also has home health RN placed wrap on his legs started two weeks ago, he reports wraps are changed twice Travis [name] week -cxr "BILATERAL pulmonary opacities are seen representing vascular congestion, similar aeration to priors. Bibasilar atelectasis is superimposed." -BNP 319, elevated when compared to priors -IV Lasix, continue at 60 -Continue beta-blocker -Echocardiogram with normal EF, grade 1 diastolic dysfunction (tech diff study, see below) - LE dopplers negative for DVT - Cardiology c/s appreciate recs  Bradycardia: sinus brady on tele, decrease metoprolol to 25 mg BID.  Continue to monitor.  Coronary artery disease, cardiac cath in 2016, nonobstructive CAD -No chest pain -Troponin mild and flat at 0.03 -EKG with chronic right bundle branch block.  PVCs, no acute ST-T changes -Continue aspirin /beta-blocker  HTN: stable on lopressor/hydralazine, diuretics  Non-insulin-dependent type 2 diabetes -A1c 7.7% in March 2019 -Hold home oral meds glimepiride -SSI while in the hospital - continue gabapentin (? Contribution to edema)  CKD 3 -Creatinine at baseline, renally dose medications -He was recently diagnosed with UTI on June 28, he was started on ciprofloxacin - has abx to continue course -Currently no urinary symptoms, UA obtained in the ED unremarkable, no fever  no leukocytosis -Follow urine culture result  BPH on Flomax  Hypothyroidism, TSH 1.17, continue Synthroid  OSA on CPAP at night Body mass index is 37.2 kg/m.  ?decubitus ulcer  LE  wounds: per nursing, pt may have sacral decub (notified after I had seen pt, will evaluate tomorrow).  Also has LE wound (had toenail removed recently).  Will consult wound.  Frequent turns.     DVT prophylaxis: heparin Code Status: full  Family Communication: discussed with wife over phone Disposition Plan: pending improvement in respiratory status   Consultants:   Cardiology  Procedures:  Echo Study Conclusions  - Left ventricle: The cavity size was normal. Wall thickness was   normal. Systolic function was normal. The estimated ejection   fraction was in the range of 55% to 60%. Wall motion was normal;   there were no regional wall motion abnormalities. Doppler   parameters are consistent with abnormal left ventricular   relaxation (grade 1 diastolic dysfunction).  Impressions:  - Technically difficult; definity used; normal LV systolic   function; mild diastolic dysfunction.  LE [place] Final Interpretation: Right: There is no evidence of deep vein thrombosis in the lower extremity. No cystic structure found in the popliteal fossa. Left: There is no evidence of deep vein thrombosis in the lower extremity. No cystic structure found in the popliteal fossa.   Antimicrobials: Anti-infectives (From admission, onward)   Start     Dose/Rate Route Frequency Ordered Stop   06/28/18 2200  ciprofloxacin (CIPRO) tablet 500 mg    Note to Pharmacy:  For 7 days     500 mg Oral 2 times daily 06/28/18 2146 07/02/18 2159         Subjective: No complaints today. On 5 L [place].  Typically on 2 L [place] at home.  Presented with confusion and SOB, which are improved. LE swelling better?  Objective: Vitals:   06/30/18 0022 06/30/18 0323 06/30/18 0648 06/30/18 0731  BP: (!) 115/47 (!) 122/51 121/65 (!) 119/52  Pulse: (!) 48 (!) 50  (!) 56  Resp: 14 20  (!) 21  Temp: 97.9 F (36.6 C) 97.8 F (36.6 C)  98 F (36.7 C)  TempSrc: Oral Oral  Oral  SpO2: 100% 100%  92%  Weight:        Height:        Intake/Output Summary (Last 24 hours) at 06/30/2018 0812 Last data filed at 06/29/2018 1800 Gross per 24 hour  Intake 240 ml  Output 2000 ml  Net -1760 ml   Filed Weights   06/29/18 1600  Weight: 127.9 kg (281 lb 15.5 oz)    Examination:  General exam: Appears calm and comfortable  Respiratory system: Clear to auscultation. Respiratory effort normal.  On 5 L [place]. Cardiovascular system: S1 & S2 heard, RRR. No JVD. Gastrointestinal system: Abdomen is nondistended, soft and nontender.  Central nervous system: Alert and oriented. No focal neurological deficits. Extremities: Bilateral LEE Skin: No appreciable rashes, lesions or ulcers Psychiatry: Judgement and insight appear normal. Mood & affect appropriate.     Data Reviewed: I have personally reviewed following labs and imaging studies  CBC: Recent Labs  Lab 06/28/18 1731  WBC 4.5  NEUTROABS 3.5  HGB 11.6*  HCT 41.3  MCV 97.2  PLT 725   Basic Metabolic Panel: Recent Labs  Lab 06/28/18 1949 06/29/18 0314 06/30/18 0337  NA 146* 146* 144  K 4.7 4.3 3.8  CL 99 101 95*  CO2 36* 37* 40*  GLUCOSE 217*  156* 121*  BUN 51* 50* 44*  CREATININE 1.78* 1.61* 1.56*  CALCIUM 9.1 8.7* 8.4*   GFR: Estimated Creatinine Clearance: 59.1 mL/min (Talaysha Freeberg) (by C-G formula based on SCr of 1.56 mg/dL (H)). Liver Function Tests: Recent Labs  Lab 06/28/18 1949  AST 21  ALT 26  ALKPHOS 87  BILITOT 0.8  PROT 7.7  ALBUMIN 3.8   No results for input(s): LIPASE, AMYLASE in the last 168 hours. Recent Labs  Lab 06/28/18 1744 06/29/18 0314  AMMONIA 29 36*   Coagulation Profile: No results for input(s): INR, PROTIME in the last 168 hours. Cardiac Enzymes: Recent Labs  Lab 06/28/18 1949 06/29/18 0127 06/29/18 0723 06/29/18 1235  TROPONINI 0.03* 0.03* 0.03* 0.03*   BNP (last 3 results) No results for input(s): PROBNP in the last 8760 hours. HbA1C: No results for input(s): HGBA1C in the last 72  hours. CBG: Recent Labs  Lab 06/29/18 0730 06/29/18 1219 06/29/18 1732 06/29/18 1927 06/30/18 0734  GLUCAP 148* 124* 170* 172* 133*   Lipid Profile: No results for input(s): CHOL, HDL, LDLCALC, TRIG, CHOLHDL, LDLDIRECT in the last 72 hours. Thyroid Function Tests: Recent Labs    06/29/18 0314  TSH 1.171   Anemia Panel: Recent Labs    06/29/18 0314  VITAMINB12 394   Sepsis Labs: Recent Labs  Lab 06/28/18 1744  LATICACIDVEN 0.9    Recent Results (from the past 240 hour(s))  MRSA PCR Screening     Status: None   Collection Time: 06/29/18  6:25 PM  Result Value Ref Range Status   MRSA by PCR NEGATIVE NEGATIVE Final    Comment:        The GeneXpert MRSA Assay (FDA approved for NASAL specimens only), is one component of Janal Haak comprehensive MRSA colonization surveillance program. It is not intended to diagnose MRSA infection nor to guide or monitor treatment for MRSA infections. Performed at [facility], [place] [address] [place], [place] [address]          Radiology Studies: Ct Abdomen Pelvis Wo Contrast  Result Date: 06/28/2018 CLINICAL DATA:  Weakness and fatigue. Patient diagnosed with the urinary tract infection 06/25/2018. EXAM: CT ABDOMEN AND PELVIS WITHOUT CONTRAST TECHNIQUE: Multidetector CT imaging of the abdomen and pelvis was performed following the standard protocol without IV contrast. COMPARISON:  CT abdomen and pelvis 08/22/2015. FINDINGS: Lower chest: There is cardiomegaly. Calcific aortic and coronary atherosclerosis is identified. Dependent atelectasis is seen in the lung bases. Hepatobiliary: No focal liver abnormality is seen. Status post cholecystectomy. No biliary dilatation. Pancreas: Unremarkable. No pancreatic ductal dilatation or surrounding inflammatory changes. Fatty atrophy is noted Spleen: Normal in size without focal abnormality. Adrenals/Urinary Tract: Adrenal glands are unremarkable. Kidneys are normal, without renal calculi, focal  lesion, or hydronephrosis. Bladder is unremarkable. Stomach/Bowel: Stomach is within normal limits. Appendix appears normal. No evidence of bowel wall thickening, distention, or inflammatory changes. Vascular/Lymphatic: Aortic atherosclerosis. No enlarged abdominal or pelvic lymph nodes. Reproductive: The patient appears to be status post TURP. Other: No fluid collection. Musculoskeletal: The patient is status post T7-10 and L4-5 fusion. Advanced multilevel spondylosis is identified. No worrisome lesion. No acute abnormality. IMPRESSION: No acute abnormality abdomen or pelvis. Cardiomegaly. Calcific aortic and coronary atherosclerosis. Advanced multilevel thoracic and lumbar spondylosis with postoperative change noted. Electronically Signed   By: Inge Rise M.D.   On: 06/28/2018 20:12   Dg Chest 2 View  Result Date: 06/28/2018 CLINICAL DATA:  Weakness and fatigue.  COPD.  CHF. EXAM: CHEST - 2  VIEW COMPARISON:  05/06/2018. FINDINGS: Midthoracic fusion apparatus appears stable. The heart is enlarged. There is Stiles Maxcy considerable amount of air under the RIGHT hemidiaphragm concerning for pneumoperitoneum. Colonic interposition could have this appearance, but this is Jawanda Passey new finding from previous. Kiyla Ringler lateral decubitus film is recommended for further evaluation. Alternately, noncontrast CT could be performed. BILATERAL pulmonary opacities are seen representing vascular congestion, similar aeration to priors. Bibasilar atelectasis is superimposed. IMPRESSION: Considerable amount of air under the RIGHT hemidiaphragm concerning for pneumoperitoneum. Colonic interposition could have Neva Ramaswamy similar appearance, and lateral decubitus film versus noncontrast CT of the abdomen and pelvis is recommended for further evaluation. Euriah Matlack call has been placed to the ordering provider to directly communicate the findings. Electronically Signed   By: Staci Righter M.D.   On: 06/28/2018 18:20   Ct Head Wo Contrast  Result Date:  06/28/2018 CLINICAL DATA:  72 year old male with generalized weakness and altered mental status. EXAM: CT HEAD WITHOUT CONTRAST TECHNIQUE: Contiguous axial images were obtained from the base of the skull through the vertex without intravenous contrast. COMPARISON:  Cervical spine MRI 06/27/2005. FINDINGS: Brain: Cerebral volume is within normal limits for age. No midline shift, ventriculomegaly, mass effect, evidence of mass lesion, intracranial hemorrhage or evidence of cortically based acute infarction. Mild for age nonspecific white matter hypodensity. Otherwise normal gray-white matter differentiation with no cortical encephalomalacia identified. Vascular: Calcified atherosclerosis at the skull base. No suspicious intracranial vascular hyperdensity. Skull: No skull fracture identified. Upper cervical spine degeneration including ligamentous hypertrophy about the odontoid resulting in upper cervical spinal stenosis (series 6, image 29). Carious maxillary dentition. Sinuses/Orbits: Multiple small maxillary sinus mucous retention cysts. Otherwise well pneumatized. Tympanic cavities and mastoids are clear. Other: Postoperative changes to both globes. No acute orbit or scalp soft tissue finding. IMPRESSION: 1. No acute intracranial abnormality. Mild for age nonspecific cerebral white matter changes. 2. 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 Electronically Signed   By: Genevie Ann M.D.   On: 06/28/2018 18:42        Scheduled Meds: . aspirin EC  81 mg Oral Daily  . ciprofloxacin  500 mg Oral BID  . escitalopram  10 mg Oral Daily  . fesoterodine  8 mg Oral Daily  . furosemide  60 mg Intravenous Q12H  . gabapentin  400 mg Oral TID  . heparin  5,000 Units Subcutaneous Q8H  . hydrALAZINE  50 mg Oral Q8H  . insulin aspart  0-9 Units Subcutaneous TID WC  . levothyroxine  175 mcg Oral QAC breakfast  . metoprolol tartrate  50 mg Oral BID  . pramipexole  0.5 mg Oral BID  . senna  2 tablet Oral BID   . sodium chloride flush  3 mL Intravenous Q12H  . tamsulosin  0.4 mg Oral QPC supper   Continuous Infusions: . sodium chloride       LOS: 2 days    Time spent: over 30 min    Fayrene Helper, MD [facility] Pager [phone]  If 7PM-7AM, please contact night-coverage [email or web address] Password [email or web address] 06/30/2018, 8:12 AM

## 2018-06-30 NOTE — Consult Note (Addendum)
Cardiology Consultation:   Patient ID: Travis [name]; [id number]; 1946/07/07   Admit date: 06/28/2018 Date of Consult: 06/30/2018  Primary Care Provider: [name], MD Primary Cardiologist: Dr. Grayland Jack, MD  Patient Profile:   Travis [name] is a 72 y.o. male with a hx of chronic diastolic heart failure, hypertension, hypothyroidism, diabetes mellitus, obstructive sleep apnea, mild pulmonary hypertension and severe lumbar and thoracic spinal stenosis (s/p decompressive laminectomy) who is being seen today for the evaluation of chronic diastolic heart failure at the request of Dr. Myna Hidalgo.  History of Present Illness:   [name] is a 72yo M with a hx as stated above who presented to [facility] on 06/28/18 with somnolence and confusion. Wife is at bedside who is assisting with history. Pt recently underwent back surgery in 03/2018 and was discharged to inpatient rehabilitation for three weeks. He states that he was doing fairly well during that time and was discharged home. He states that he has been more fatigued since his surgery and feels that he has been slowly declining. Wife states that although he was wheelchair bound at baseline, he was able to get around and was only mildly dyspneic with exertion. More recently however, his work of breathing has worsened which has been more acute over the last week. Wife notes that the patient has been sleeping for most of the day and has been more confused. He has been hospitalized several times with acute on chronic CHF and has had diuretic adjustments during those times. Wife states that he was taking Demadex 60mg  M, W, F and 40mg  on the other days without resolution. Wife also reports that his BLE were swelling and he was having tremendous difficulty with orthopnea. We followed with him as a group through his post-surgical course secondary to moderate/high surgery risk. He denies chest pain, palpitations, dizziness and syncope. He has baseline  dyspnea on supplemental oxygen at home, although these requirements are increasing as well (2L at home and is currently on 5L here). He was recently seen by pulmonology OP due to post-op oxygen requirements without much adjustment.   In the ED, he was noted to be mildly hypoxic saturated in the low 90's. EKG performed which revealed NSR with PVC and known RBBB. CXR with pulmonary vascular congestion and possible pneumoperitoneum.   Abdomen and pelvis CT performed which was negative for acute findings. Creatinine on admission was 1.78. BNP was mildly elevated at 319. He was started on Bipap and treated with IV lasix. ABG with pCO2 of 78. Troponin levels were normal x2.   Of note, he was past seen in our office 05/18/18 for chronic CHF follow up after hospitalization for lumbar laminectomy performed on 04/08/18. He was discharged on 04/15/18. This hospitalization was complicated by acute respiratory failure with encephalopathy requiring Bipap ventilation. We preemptively followed him during his hospital course given his high risk of acute HF exacerbation with surgery. Additionally, he had issues with acute on chronic renal failure and lower extremity ischemic changes. He was aggressively diuresed to improve his respiratory function. He was ultimately discharged to a rehabilitation facility on supplemental O2. He remained on Demedex 80mg  twice daily and hydralalzine 50mg  Q8H. He exhibited no s/s of fluid volume overload at the time of discharge. He was discharged from rehab to home on 05/11/18 with oxygen.   At his cardiology follow up appointment, he reported feeling well with no s/s of CP or dyspnea at rest. He reported baseline orthopnea and DOE with minimal  exertion. His LE were wrapped with UNNA boots. After surgery, he had regained some use of his LE working with PT and short distances with family. His Demadex was down to 60mg  twice daily MWF and 40mg  the other days.  His weight was 276lb on 05/18/18.    Past Medical History:  Diagnosis Date  . Cervical myelopathy (HCC)    with myelomalacia at C5-6 and C3-4  . CHF (congestive heart failure) ([place])   . Constipation - functional    controlled with miralax  . COPD (chronic obstructive pulmonary disease) (HCC)    mild  . DJD (degenerative joint disease)    WHOLE SPINE  . Dyspnea   . Gait disorder   . Heart murmur    years ago   . Hypertension   . Hypothyroidism    Low d/t treatment of hyperthyroidism  . Lumbosacral spinal stenosis 03/13/2014  . On home oxygen therapy    "2.5L all the time" (06/29/2018)  . Peripheral neuropathy   . Pneumonia   . PONV (postoperative nausea and vomiting)   . RBBB   . Renal disorder    acute kidney failure  . Restless legs syndrome 03/14/2015  . RLS (restless legs syndrome)   . Sleep apnea    DX  2009/2010 study done at [facility]  . 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 Type II diabetes mellitus ([place])    diet controlled    Past Surgical History:  Procedure Laterality Date  . BACK SURGERY     6 surgeries  . CARDIAC CATHETERIZATION [name] 09/07/2015   Procedure: Right/Left Heart Cath and Coronary Angiography;  Surgeon: Peter M Martinique, MD;  Location: [facility];  Service: Cardiovascular;  Laterality: [name];  . CHOLECYSTECTOMY    . EYE SURGERY     BIL CATARACTS  . LUMBAR LAMINECTOMY/DECOMPRESSION MICRODISCECTOMY  07/14/2012   Procedure: LUMBAR LAMINECTOMY/DECOMPRESSION MICRODISCECTOMY 2 LEVELS;  Surgeon: Eustace Moore, MD;  Location: [facility];  Service: Neurosurgery;  Laterality: Left;  Lumbar two three laminectomy, left thoracic four five transpedicular diskectomy  . POSTERIOR CERVICAL FUSION/FORAMINOTOMY  04/08/2018   Procedure: Laminectomy and Foraminotomy - Lumbar Four-Lumbar Five, instrumented posterolateral fusion Lumbar Four- Five, Laminectomy and Foraminotomy - Thoracic One-Thoracic Two;  Surgeon: Eustace Moore, MD;  Location: [facility] OR;  Service: Neurosurgery;;  posterior  . POSTERIOR  LUMBAR FUSION  04/08/2018   Laminectomy and Foraminotomy - Lumbar Four-Lumbar Five, instrumented posterolateral fusion Lumbar Four- Five, Laminectomy and Foraminotomy - Thoracic One-Thoracic Two  . POSTERIOR LUMBAR FUSION  04/08/2018   Laminectomy and Foraminotomy - Lumbar Four-Lumbar Five, instrumented posterolateral fusion Lumbar Four- Five, Laminectomy and Foraminotomy - Thoracic One-Thoracic Two     Prior to Admission medications   Medication Sig Start Date End Date Taking? Authorizing Provider  aspirin EC 81 MG EC tablet Take 1 tablet (81 mg total) by mouth daily. 08/23/15  Yes Thurnell Lose, MD  ciprofloxacin (CIPRO) 500 MG tablet Take 500 mg by mouth 2 (two) times daily. For 7 days 06/25/18 07/02/18 Yes [provider]  escitalopram (LEXAPRO) 10 MG tablet Take 1 tablet (10 mg total) by mouth daily. 05/12/18  Yes Angiulli, Lavon Paganini, [name]  gabapentin (NEURONTIN) 400 MG capsule TAKE 1 CAPSULE (400 MG TOTAL) BY MOUTH EVERY 8 (EIGHT) HOURS. 05/12/18  Yes Angiulli, Lavon Paganini, [name]  glimepiride (AMARYL) 2 MG tablet Take 1 tablet (2 mg total) by mouth daily with breakfast. 05/13/18  Yes Angiulli, Lavon Paganini, [name]  hydrALAZINE (  APRESOLINE) 50 MG tablet Take 1 tablet (50 mg total) by mouth every 8 (eight) hours. 05/12/18  Yes Angiulli, Lavon Paganini, [name]  levothyroxine (SYNTHROID, LEVOTHROID) 175 MCG tablet Take 1 tablet (175 mcg total) by mouth daily. 05/12/18  Yes Angiulli, Lavon Paganini, [name]  metoprolol tartrate (LOPRESSOR) 50 MG tablet Take 1 tablet (50 mg total) by mouth 2 (two) times daily. 05/12/18  Yes Angiulli, Lavon Paganini, [name]  polyethylene glycol (MIRALAX / GLYCOLAX) packet Take 17 g by mouth daily as needed. For constipation 08/09/14  Yes Love, Ivan Anchors, [name]  potassium chloride SA (K-DUR,KLOR-CON) 20 MEQ tablet Take 1 tablet (20 mEq total) by mouth 2 (two) times daily. 05/12/18  Yes Angiulli, Lavon Paganini, [name]  pramipexole (MIRAPEX) 0.5 MG tablet Take 1 tablet (0.5 mg total) by mouth 2 (two) times  daily. 05/12/18  Yes Angiulli, Lavon Paganini, [name]  senna (SENOKOT) 8.6 MG TABS tablet Take 2 tablets (17.2 mg total) by mouth 2 (two) times daily. 05/12/18  Yes Angiulli, Lavon Paganini, [name]  tamsulosin (FLOMAX) 0.4 MG CAPS capsule Take 1 capsule (0.4 mg total) by mouth daily. 05/12/18  Yes Angiulli, Lavon Paganini, [name]  torsemide (DEMADEX) 20 MG tablet Take 60 mg twice daily on M, W, F and 40 mg twice daily on other days 06/15/18  Yes Nahser, Wonda Cheng, MD  TOVIAZ 8 MG TB24 tablet Take 1 tablet (8 mg total) by mouth daily. 05/12/18  Yes Angiulli, Lavon Paganini, [name]    Inpatient Medications: Scheduled Meds: . aspirin EC  81 mg Oral Daily  . ciprofloxacin  500 mg Oral BID  . escitalopram  10 mg Oral Daily  . fesoterodine  8 mg Oral Daily  . furosemide  60 mg Intravenous BID  . gabapentin  400 mg Oral TID  . heparin  5,000 Units Subcutaneous Q8H  . hydrALAZINE  50 mg Oral Q8H  . insulin aspart  0-9 Units Subcutaneous TID WC  . levothyroxine  175 mcg Oral QAC breakfast  . metoprolol tartrate  25 mg Oral BID  . pramipexole  0.5 mg Oral BID  . senna  2 tablet Oral BID  . sodium chloride flush  3 mL Intravenous Q12H  . tamsulosin  0.4 mg Oral QPC supper   Continuous Infusions: . sodium chloride     PRN Meds: sodium chloride, acetaminophen, albuterol, ondansetron (ZOFRAN) IV, sodium chloride flush  Allergies:    Allergies  Allergen Reactions  . Other Other (See Comments)    ANESTHESIA UNSPECIFIED  -- makes sick, must have antinausea medication in advance.  . Levofloxacin In D5w Itching    Possible infusion reaction 03/29/17. Tolerating oral Levaquin 03/30/17    Social History:   Social History   Socioeconomic History  . Marital status: Married    Spouse name: carolyn  . Number of children: 3  . Years of education: 7  . Highest education level: Not on file  Occupational History    Employer: [employer]: Works in [place]  . Financial resource strain:  Not on file  . Food insecurity:    Worry: Not on file    Inability: Not on file  . Transportation needs:    Medical: Not on file    Non-medical: Not on file  Tobacco Use  . Smoking status: Former Smoker    Packs/day: 2.00    Years: 20.00    Pack years: 40.00    Types: Cigarettes    Last attempt to  quit: 06/28/1985    Years since quitting: 33.0  . Smokeless tobacco: Former Systems developer    Quit date: 06/28/1985  Substance and Sexual Activity  . Alcohol use: No  . Drug use: No  . Sexual activity: Not on file  Lifestyle  . Physical activity:    Days per week: Not on file    Minutes per session: Not on file  . Stress: Not on file  Relationships  . Social connections:    Talks on phone: Not on file    Gets together: Not on file    Attends religious service: Not on file    Active member of club or organization: Not on file    Attends meetings of clubs or organizations: Not on file    Relationship status: Not on file  . Intimate partner violence:    Fear of current or ex partner: Not on file    Emotionally abused: Not on file    Physically abused: Not on file    Forced sexual activity: Not on file  Other Topics Concern  . Not on file  Social History Narrative   Lives with wife,  [name]   Patient is married with 3 children.   Patient has 16 yrs of education.   Patient is right handed.   Patient drinks 2 cups of caffeine per day.    Family History:   Family History  Problem Relation Age of Onset  . Polycythemia Mother   . Diabetes Father   . Hypertension Father   . Heart disease Father   . CAD Unknown        1 of his 4 brothers and father has CAD   Family Status:  Family Status  Relation Name Status  . Mother  Deceased at age 2       Blood disorder  . Father  Deceased at age 22  . Brother  Alive       Good health  . Brother  Alive       Good health  . Brother  Alive       Good health  . Brother  Alive       Good health  . MGM  Deceased  . MGF  Deceased  . PGM   Deceased  . PGF  Deceased  . Unknown  (Not Specified)    ROS:  Please see the history of present illness.  All other ROS reviewed and negative.     Physical Exam/Data:   Vitals:   06/30/18 0323 06/30/18 0648 06/30/18 0731 06/30/18 1132  BP: (!) 122/51 121/65 (!) 119/52 125/71  Pulse: (!) 50  (!) 56 (!) 58  Resp: 20  (!) 21 19  Temp: 97.8 F (36.6 C)  98 F (36.7 C) 98.2 F (36.8 C)  TempSrc: Oral  Oral Oral  SpO2: 100%  92% 93%  Weight:      Height:        Intake/Output Summary (Last 24 hours) at 06/30/2018 1141 Last data filed at 06/30/2018 0656 Gross per 24 hour  Intake 240 ml  Output 3000 ml  Net -2760 ml   Filed Weights   06/29/18 1600  Weight: 281 lb 15.5 oz (127.9 kg)   Body mass index is 37.2 kg/m.   General: Elderly, well developed,  NAD Skin: Warm, dry, intact  Head: Normocephalic, atraumatic, clear, moist mucus membranes. Neck: Negative for carotid bruits. No JVD Lungs: Clear to ausculation bilaterally. No wheezes, rales, or rhonchi. Breathing is labored with  one-two sentence communication  Cardiovascular: RRR with S1 S2. No murmurs, rubs or gallops, Abdomen: Soft, non-tender, non-distended with normoactive bowel sounds.  No obvious abdominal masses. MSK: Strength and tone appear normal for age. 5/5 in all extremities Extremities: BLE 2+ edema with ACE wraps in place. No clubbing or cyanosis. DP/PT pulses 1+ bilaterally Neuro: Alert and oriented. No focal deficits. No facial asymmetry. MAE spontaneously. Psych: Responds to questions appropriately with normal affect.     EKG:  The EKG was personally reviewed and demonstrates:  06/29/18 NSR with RBBB and PVC, no acute ischemic changes  Telemetry:  Telemetry was personally reviewed and demonstrates: 06/30/18 NSR with no acute ischemic changes   Relevant CV Studies:  Echocardiogram 06/29/18: Study Conclusions  - Left ventricle: The cavity size was normal. Wall thickness was   normal. Systolic function  was normal. The estimated ejection   fraction was in the range of 55% to 60%. Wall motion was normal;   there were no regional wall motion abnormalities. Doppler   parameters are consistent with abnormal left ventricular   relaxation (grade 1 diastolic dysfunction).  Impressions:  - Technically difficult; definity used; normal LV systolic   function; mild diastolic dysfunction.  ECHO:02/2017 - Left ventricle: The cavity size was normal. Wall thickness was increased in a pattern of mild LVH. Systolic function was vigorous. The estimated ejection fraction was in the range of 65% to 70%. Wall motion was normal; there were no regional wall motion abnormalities. Doppler parameters are consistent with pseudonormal left ventricular relaxation (grade 2 diastolic dysfunction). The E/A ratio is >1.5. The E/e&' ratio is >15, suggesting elevated LV filling pressure. - Left atrium: The atrium was mildly dilated. - Inferior vena cava: The vessel was normal in size. The respirophasic diameter changes were in the normal range (>= 50%), consistent with normal central venous pressure. Impressions: - Compared to a prior study in 2016, there is no significant change. LV filling pressure is elevated.  CATH: Cardiac catheterization 09/07/15:  Prox RCA to Mid RCA lesion, 40% stenosed.  Ost Cx to Prox Cx lesion, 50% stenosed.  LM lesion, 40% stenosed.  The left ventricular systolic function is normal.  Dist LAD lesion, 40% stenosed.   1. Nonobstructive CAD 2. Normal LV function. 3. Mild pulmonary HTN with normal LV filling pressures.   Plan: medical therapy.  Laboratory Data:  Chemistry Recent Labs  Lab 06/28/18 1949 06/29/18 0314 06/30/18 0337  NA 146* 146* 144  K 4.7 4.3 3.8  CL 99 101 95*  CO2 36* 37* 40*  GLUCOSE 217* 156* 121*  BUN 51* 50* 44*  CREATININE 1.78* 1.61* 1.56*  CALCIUM 9.1 8.7* 8.4*  GFRNONAA 36* 41* 42*  GFRAA 42* 47* 49*   ANIONGAP 11 8 9     Total Protein  Date Value Ref Range Status  06/28/2018 7.7 6.5 - 8.1 g/dL Final  09/23/2017 7.2 6.0 - 8.5 g/dL Final   Albumin  Date Value Ref Range Status  06/28/2018 3.8 3.5 - 5.0 g/dL Final   AST  Date Value Ref Range Status  06/28/2018 21 15 - 41 U/L Final   ALT  Date Value Ref Range Status  06/28/2018 26 0 - 44 U/L Final    Comment:    Please note change in reference range.   Alkaline Phosphatase  Date Value Ref Range Status  06/28/2018 87 38 - 126 U/L Final   Total Bilirubin  Date Value Ref Range Status  06/28/2018 0.8 0.3 - 1.2 mg/dL Final  Hematology Recent Labs  Lab 06/28/18 1731  WBC 4.5  RBC 4.25  HGB 11.6*  HCT 41.3  MCV 97.2  MCH 27.3  MCHC 28.1*  RDW 16.5*  PLT 202   Cardiac Enzymes Recent Labs  Lab 06/28/18 1949 06/29/18 0127 06/29/18 0723 06/29/18 1235  TROPONINI 0.03* 0.03* 0.03* 0.03*   No results for input(s): TROPIPOC in the last 168 hours.  BNP Recent Labs  Lab 06/28/18 1952  BNP 319.2*    DDimer No results for input(s): DDIMER in the last 168 hours. TSH:  Lab Results  Component Value Date   TSH 1.171 06/29/2018   Lipids: Lab Results  Component Value Date   CHOL 124 08/20/2015   HDL 29 (L) 08/20/2015   LDLCALC 79 08/20/2015   TRIG 82 08/20/2015   CHOLHDL 4.3 08/20/2015   HgbA1c: Lab Results  Component Value Date   HGBA1C 7.7 (H) 03/02/2018    Radiology/Studies:  Ct Abdomen Pelvis Wo Contrast  Result Date: 06/28/2018 CLINICAL DATA:  Weakness and fatigue. Patient diagnosed with the urinary tract infection 06/25/2018. EXAM: CT ABDOMEN AND PELVIS WITHOUT CONTRAST TECHNIQUE: Multidetector CT imaging of the abdomen and pelvis was performed following the standard protocol without IV contrast. COMPARISON:  CT abdomen and pelvis 08/22/2015. FINDINGS: Lower chest: There is cardiomegaly. Calcific aortic and coronary atherosclerosis is identified. Dependent atelectasis is seen in the lung bases.  Hepatobiliary: No focal liver abnormality is seen. Status post cholecystectomy. No biliary dilatation. Pancreas: Unremarkable. No pancreatic ductal dilatation or surrounding inflammatory changes. Fatty atrophy is noted Spleen: Normal in size without focal abnormality. Adrenals/Urinary Tract: Adrenal glands are unremarkable. Kidneys are normal, without renal calculi, focal lesion, or hydronephrosis. Bladder is unremarkable. Stomach/Bowel: Stomach is within normal limits. Appendix appears normal. No evidence of bowel wall thickening, distention, or inflammatory changes. Vascular/Lymphatic: Aortic atherosclerosis. No enlarged abdominal or pelvic lymph nodes. Reproductive: The patient appears to be status post TURP. Other: No fluid collection. Musculoskeletal: The patient is status post T7-10 and L4-5 fusion. Advanced multilevel spondylosis is identified. No worrisome lesion. No acute abnormality. IMPRESSION: No acute abnormality abdomen or pelvis. Cardiomegaly. Calcific aortic and coronary atherosclerosis. Advanced multilevel thoracic and lumbar spondylosis with postoperative change noted. Electronically Signed   By: Inge Rise M.D.   On: 06/28/2018 20:12   Dg Chest 2 View  Result Date: 06/28/2018 CLINICAL DATA:  Weakness and fatigue.  COPD.  CHF. EXAM: CHEST - 2 VIEW COMPARISON:  05/06/2018. FINDINGS: Midthoracic fusion apparatus appears stable. The heart is enlarged. There is a considerable amount of air under the RIGHT hemidiaphragm concerning for pneumoperitoneum. Colonic interposition could have this appearance, but this is a new finding from previous. A lateral decubitus film is recommended for further evaluation. Alternately, noncontrast CT could be performed. BILATERAL pulmonary opacities are seen representing vascular congestion, similar aeration to priors. Bibasilar atelectasis is superimposed. IMPRESSION: Considerable amount of air under the RIGHT hemidiaphragm concerning for pneumoperitoneum.  Colonic interposition could have a similar appearance, and lateral decubitus film versus noncontrast CT of the abdomen and pelvis is recommended for further evaluation. A call has been placed to the ordering provider to directly communicate the findings. Electronically Signed   By: Staci Righter M.D.   On: 06/28/2018 18:20   Ct Head Wo Contrast  Result Date: 06/28/2018 CLINICAL DATA:  72 year old male with generalized weakness and altered mental status. EXAM: CT HEAD WITHOUT CONTRAST TECHNIQUE: Contiguous axial images were obtained from the base of the skull through the vertex without intravenous contrast. COMPARISON:  Cervical spine MRI 06/27/2005. FINDINGS: Brain: Cerebral volume is within normal limits for age. No midline shift, ventriculomegaly, mass effect, evidence of mass lesion, intracranial hemorrhage or evidence of cortically based acute infarction. Mild for age nonspecific white matter hypodensity. Otherwise normal gray-white matter differentiation with no cortical encephalomalacia identified. Vascular: Calcified atherosclerosis at the skull base. No suspicious intracranial vascular hyperdensity. Skull: No skull fracture identified. Upper cervical spine degeneration including ligamentous hypertrophy about the odontoid resulting in upper cervical spinal stenosis (series 6, image 29). Carious maxillary dentition. Sinuses/Orbits: Multiple small maxillary sinus mucous retention cysts. Otherwise well pneumatized. Tympanic cavities and mastoids are clear. Other: Postoperative changes to both globes. No acute orbit or scalp soft tissue finding. IMPRESSION: 1. No acute intracranial abnormality. Mild for age nonspecific cerebral white matter changes. 2. 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 Electronically Signed   By: Genevie Ann M.D.   On: 06/28/2018 18:42   Assessment and Plan:   1. Acute on chronic diastolic heart failure with known PAH: -Pt has a long hx of chronic diastolic congestive  heart failure. He recently underwent back surgery in 03/2018 and was followed by our service at the request of his primary cardiologist given his moderate/high risk of complication during pre-operative evaluation 01/2018 . He had some complication with respiratory failure during his hospital course, but was ultimately diuresed and stabilized and discharged to inpatient rehab >>then home. He was seen in the office for follow up and was doing fairly well, working with PT and gaining some strength in LE (wheelchair at baseline). He has reportedly been more lethargic since his surgery and has become acutely confused on day of admission. His home oxygen demand has been greater as well  -He has chronic LE edema which has most recenltly been managed with LE elevation and wraps>>>reports that this has improved since admission  -CXR with considerable amount of air under the RIGHT hemidiaphragm concerning for pneumoperitoneum. Colonic interposition could have a similar appearance, and lateral decubitus film versus noncontrast CT of the abdomen and pelvis is recommended >>this was negative -Echocardiogram with LVEF of 55-60% with NWMA and G1DD -Weight, 281lb, pt was 276lb at last OP appointment 05/18/18  -I&O, net negative 6.1L since admission, 24H total output 4L -Continue IV Lasix 60mg  twice daily>>>was on Demadex 60mg  twice daily MWF and 40mg  on other days at home   -Creatinine improved, 1.56 today>>1.78 on admission. Peak creatinine during last hospitalization 2.74 -Consider pulmonology consultation given his worsening respiratory status since surgery>>>improved since admission, however he remains sedentary and is decompensated   2. Acute hypercarbic respiratory failure with encephalopathy: -Pt presented with acute confusion, encephalopathy and lethargy -ABG in the ED revealed hypercardia, pCO2>>78 -Pt placed on Bipap ventilation with improvement>>>now on [place]   -Likely in the setting of acute CHF  exacerbation>>however recent respiratory decline does not fully match full clinical picture  -Consider pulmonology consultation given his worsening respiratory status since surgery>>>improved since admission, however he remains sedentary and is decompensated   3. CKD Stage III: -Creatinine, improving>>1.56 today -Baseline appears to be in the 1.3 range  4. Hypothyroidism: -TSH, 1.171 -Continue synthroid   5. Elevated troponin: -Troponin, 0.03, flat with no complaints of chest pain or other ACS symptoms -Low suspicion for ACS  -ASA, BB, statin   6. DM2: -SSI for glucose control  -HbA1c, 7.7 in 02/2018  7. HTN: -Stable, 125/71>119/52>121/65 -Continue hydralazine 50mg  Q8H, metoprolol 25mg  twice daily home dose   For questions or updates, please contact [place] Please  consult [email or web address] for contact info under Cardiology/STEMI.   SignedKathyrn Drown NP-[facility] Pager: [phone] 06/30/2018 11:41 AM   I have seen and examined the patient along with Kathyrn Drown NP-C.  I have reviewed the chart, notes and new data.  I agree with NP's note.  Key new complaints: Presentation was with acute hypercapnic respiratory failure and confusion.  He has clear evidence of severe COPD and probably has some degree of chronic hypoventilation, acute exacerbation of respiratory failure due to left heart failure.  In addition, he clearly has evidence of severe right heart failure, in turn probably related to cor pulmonale. Key examination changes: Has bilateral lower extremity edema, despite elastic bandages; bilateral jugular venous distention, roughly 8-9 cm above sternal angle, a few moist rales in both bases.  Diffusely diminished breath sounds, no active wheezing.  He is obese. Key new findings / data: Pulmonary function tests showed severe COPD with an FEV1 just over 40%, with a little response to bronchodilators.  Echocardiogram performed yesterday morning still showed evidence of  volume overload (E/E prime ratio around 14) and his BNP was also higher than baseline.  He has diuresed 2 L since the dose tests were performed, over and above the 4.6 L that he had already lost since admission.  Creatinine today slightly improved compared to yesterday even with brisk additional diuresis.  PLAN: I think his most serious problem is the severe COPD, but he still has some degree of decompensation related to left heart failure.  Continue intravenous diuretics today.  He estimates that his optimal weight is 275-280 pounds and it is possible that he has lost some additional weight with his recent medical problems.  He has not yet been weighed today.   Probably switch to oral diuretics tomorrow. Optimal fluid management will not change the fact that he has advanced lung disease and its possible he will require permanent oxygen supplementation.  It is also likely that he will have some residual degree of lower extremity edema related to cor pulmonale, even when we have achieved optimal left heart filling pressures. Will follow along.  Sanda Klein, MD, [place] [phone] 06/30/2018, 5:35 PM

## 2018-06-30 NOTE — Evaluation (Signed)
Physical Therapy Evaluation Patient Details Name: Travis [name] MRN: [id number] DOB: [birth year] Today's Date: 06/30/2018   History of Present Illness  Pt is a 72 y.o. M with significant PMH of type 2 diabetes mellitus, hypothyroidism, hypertension, chronic kidney disease stage III, COPD, chronic hypoxic respiratory failure, recent back surgery (4/19) who presents for evaluation of somnolence and confusion.  Clinical Impression  Pt admitted with above diagnosis. Pt currently with functional limitations due to the deficits listed below (see PT Problem List). At baseline, patient is a very limited household ambulator using walker, but mainly uses wheelchair for mobility and has assistance with ADL's. Currently presenting with decreased functional mobility secondary to generalized weakness, dyspnea on exertion, decreased endurance, and poor standing balance. Transferring from bed to chair with two person maximal assistance using walker. Decrease from 93% SpO2 to 85% SpO2 on 6 L. Frequent cueing for pursed lip breathing. Pt will benefit from skilled PT to increase their independence and safety with mobility to allow discharge to the venue listed below.       Follow Up Recommendations SNF    Equipment Recommendations  None recommended by PT    Recommendations for Other Services       Precautions / Restrictions Precautions Precautions: Fall Restrictions Weight Bearing Restrictions: No      Mobility  Bed Mobility Overal bed mobility: Needs Assistance Bed Mobility: Supine to Sit     Supine to sit: Max assist;+2 for safety/equipment     General bed mobility comments: cueing for log roll technique and max assist at trunk to elevate  Transfers Overall transfer level: Needs assistance Equipment used: Rolling walker (2 wheeled) Transfers: Sit to/from [facility] Sit to Stand: Max assist;+2 physical assistance Stand pivot transfers: Max assist;+2 safety/equipment        General transfer comment: requiring max assist to boost up to standing with cueing for trunk/hip/knee extension to achieve upright. increased time/effort required to achieve full standing position. requiring up to max assistance for stand pivot due to patient's inability to keep all 4 points of the walker on the ground and subsequent posterior lean/decreased eccentric control to sitting on the chair .   Ambulation/Gait             General Gait Details: not attempted  Stairs            Wheelchair Mobility    Modified Rankin (Stroke Patients Only)       Balance Overall balance assessment: Needs assistance Sitting-balance support: Bilateral upper extremity supported;Feet supported Sitting balance-Leahy Scale: Fair     Standing balance support: Bilateral upper extremity supported Standing balance-Leahy Scale: Poor Standing balance comment: reliant on external support                             Pertinent Vitals/Pain Pain Assessment: No/denies pain    Home Living Family/patient expects to be discharged to:: Private residence Living Arrangements: Spouse/significant other;Children Available Help at Discharge: Family;Available 24 hours/day;Personal care attendant Type of Home: House Home Access: Ramped entrance     Home Layout: One level Home Equipment: [place] - 2 wheels;Walker - 4 wheels;Cane - single point;Bedside commode;Wheelchair - manual;Shower seat - built in;Grab bars - tub/shower Additional Comments: pt had caregivers 3x a week for bathing.  pt's wife was able to assist with transfers and toileting. pt mostly w/c level    Prior Function Level of Independence: Needs assistance   Gait / Transfers Assistance Needed: Pt  can transfer with assist but has not walked functionally since december. Mostly uses wheelchair for mobility  ADL's / Homemaking Assistance Needed: assist for ADL's        Hand Dominance   Dominant Hand: Right     Extremity/Trunk Assessment   Upper Extremity Assessment Upper Extremity Assessment: Defer to OT evaluation    Lower Extremity Assessment Lower Extremity Assessment: Generalized weakness       Communication   Communication: No difficulties  Cognition Arousal/Alertness: Awake/alert Behavior During Therapy: WFL for tasks assessed/performed Overall Cognitive Status: Within Functional Limits for tasks assessed                                        General Comments General comments (skin integrity, edema, etc.): bilateral Unna boots donned    Exercises General Exercises - Lower Extremity Long Arc Quad: 5 reps;Both;Seated   Assessment/Plan    PT Assessment Patient needs continued PT services  PT Problem List Decreased strength;Decreased range of motion;Decreased activity tolerance;Decreased balance;Decreased mobility;Decreased coordination;Decreased knowledge of use of DME       PT Treatment Interventions DME instruction;Gait training;Functional mobility training;Therapeutic activities;Therapeutic exercise;Balance training;Patient/family education;Wheelchair mobility training    PT Goals (Current goals can be found in the Care Plan section)  Acute Rehab PT Goals Patient Stated Goal: walk PT Goal Formulation: With patient Time For Goal Achievement: 07/14/18 Potential to Achieve Goals: Fair    Frequency Min 2X/week   Barriers to discharge        Co-evaluation               AM-PAC PT "6 Clicks" Daily Activity  Outcome Measure Difficulty turning over in bed (including adjusting bedclothes, sheets and blankets)?: Unable Difficulty moving from lying on back to sitting on the side of the bed? : Unable Difficulty sitting down on and standing up from a chair with arms (e.g., wheelchair, bedside commode, etc,.)?: Unable Help needed moving to and from a bed to chair (including a wheelchair)?: A Lot Help needed walking in hospital room?: A Lot Help needed  climbing 3-5 steps with a railing? : Total 6 Click Score: 8    End of Session Equipment Utilized During Treatment: Gait belt Activity Tolerance: Patient tolerated treatment well Patient left: in chair;with call bell/phone within reach Nurse Communication: Mobility status PT Visit Diagnosis: Unsteadiness on feet (R26.81);Muscle weakness (generalized) (M62.81);Difficulty in walking, not elsewhere classified (R26.2)    Time: 8811-0315 PT Time Calculation (min) (ACUTE ONLY): 20 min   Charges:   PT Evaluation $PT Eval Moderate Complexity: 1 Mod     PT G Codes:        Travis [name], PT, DPT Acute Rehabilitation Services  Pager: [phone]   Willy Eddy 06/30/2018, 4:24 PM

## 2018-06-30 NOTE — NC FL2 (Signed)
[place] LEVEL OF CARE SCREENING TOOL     IDENTIFICATION  Patient Name: Travis [name] Birthdate: Oct 09, 1946 Sex: male Admission Date (Current Location): 06/28/2018  [facility] and [place] Number:  Herbalist and Address:  [facility], [place] [address], [place], [place] [address]      Provider Number: [id number]  Attending Physician Name and Address:  Elodia Florence., *  Relative Name and Phone Number:       Current Level of Care: Hospital Recommended Level of Care: [place] Prior Approval Number:    Date Approved/Denied:   PASRR Number: [id number]  Discharge Plan: SNF    Current Diagnoses: Patient Active Problem List   Diagnosis Date Noted  . Acute encephalopathy 06/28/2018  . Oxygen desaturation   . Supplemental oxygen dependent   . Poorly controlled type 2 diabetes mellitus with peripheral neuropathy ([place])   . Labile blood pressure   . Labile blood glucose   . Acute on chronic diastolic (congestive) heart failure ([place])   . Myelopathy ([place]) 04/16/2018  . Benign essential HTN   . Benign prostatic hyperplasia with urinary retention   . Acute on chronic respiratory failure with hypoxia and hypercapnia (HCC)   . DDD (degenerative disc disease), cervical   . Morbid obesity ([place])   . S/P lumbar spinal fusion 04/08/2018  . Paraparesis of both lower limbs ([place]) 11/11/2017  . Myelopathy, spondylogenic, cervical 11/11/2017  . Excessive daytime sleepiness 11/11/2017  . 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 Debility 10/08/2017  . Stage 3 chronic kidney disease ([place])   . Spinal stenosis of lumbar region   . Acute blood loss anemia   . Urinary tract infection without hematuria   . Elevated troponin I level   . Hypothyroid 08/19/2015  . Restless legs syndrome 03/14/2015  . Shortness of breath 03/09/2015  . Sinus bradycardia 09/21/2014  . Thoracic spondylosis with  myelopathy 09/13/2014  . Cervical arthritis with myelopathy 07/18/2014  . Constipation - functional   . Sepsis ([place]) 07/10/2014  . OSA on CPAP 07/10/2014  . Edema leg 07/10/2014  . Lumbosacral spinal stenosis 03/13/2014  . Other vitamin B12 deficiency anemia 07/13/2013  . 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 Polyneuropathy in other diseases classified elsewhere ([place]) 02/17/2013  . Other general symptoms(780.99) 02/17/2013  . Abnormality of gait 02/17/2013  . Cervical spondylosis with myelopathy 02/17/2013  . Back ache 09/15/2012    Orientation RESPIRATION BLADDER Height & Weight     Self, Time, Situation, Place  O2(5L [place]) Incontinent, External catheter Weight: 281 lb 15.5 oz (127.9 kg) Height:  6\' 1"  (185.4 cm)  BEHAVIORAL SYMPTOMS/MOOD NEUROLOGICAL BOWEL NUTRITION STATUS      Continent Diet(cardiac; carb modified)  AMBULATORY STATUS COMMUNICATION OF NEEDS Skin   Extensive Assist Verbally Normal                       Personal Care Assistance Level of Assistance  Bathing, Dressing Bathing Assistance: Maximum assistance   Dressing Assistance: Maximum assistance     Functional Limitations Info             SPECIAL CARE FACTORS FREQUENCY  PT (By licensed PT), OT (By licensed OT)     PT Frequency: 5/wk OT Frequency: 5/wk            Contractures      Additional Factors Info  Code Status, Allergies, Insulin  Sliding Scale Code Status Info: FULL Allergies Info: Other, Levofloxacin In D5w   Insulin Sliding Scale Info: 3/day       Current Medications (06/30/2018):  This is the current hospital active medication list Current Facility-Administered Medications  Medication Dose Route Frequency Provider Last Rate Last Dose  . 0.9 %  sodium chloride infusion  250 mL Intravenous PRN Opyd, Ilene Qua, MD      . acetaminophen (TYLENOL) tablet 650 mg  650 mg Oral Q4H PRN Opyd, Ilene Qua, MD      . albuterol (PROVENTIL) (2.5 MG/3ML) 0.083%  nebulizer solution 2.5 mg  2.5 mg Nebulization Q4H PRN Opyd, Ilene Qua, MD      . aspirin EC tablet 81 mg  81 mg Oral Daily Opyd, Ilene Qua, MD   81 mg at 06/30/18 0948  . ciprofloxacin (CIPRO) tablet 500 mg  500 mg Oral BID Vianne Bulls, MD   500 mg at 06/30/18 0948  . escitalopram (LEXAPRO) tablet 10 mg  10 mg Oral Daily Opyd, Ilene Qua, MD   10 mg at 06/30/18 0948  . fesoterodine (TOVIAZ) tablet 8 mg  8 mg Oral Daily Opyd, Ilene Qua, MD   8 mg at 06/30/18 0948  . furosemide (LASIX) injection 60 mg  60 mg Intravenous BID Elodia Florence., MD      . gabapentin (NEURONTIN) capsule 400 mg  400 mg Oral TID Vianne Bulls, MD   400 mg at 06/30/18 0948  . heparin injection 5,000 Units  5,000 Units Subcutaneous Q8H Vianne Bulls, MD   5,000 Units at 06/30/18 0655  . hydrALAZINE (APRESOLINE) tablet 50 mg  50 mg Oral Q8H Opyd, Ilene Qua, MD   50 mg at 06/30/18 0649  . insulin aspart (novoLOG) injection 0-9 Units  0-9 Units Subcutaneous TID WC Gardiner Barefoot, NP   1 Units at 06/30/18 0946  . levothyroxine (SYNTHROID, LEVOTHROID) tablet 175 mcg  175 mcg Oral QAC breakfast Opyd, Ilene Qua, MD   175 mcg at 06/30/18 [phone]  . metoprolol tartrate (LOPRESSOR) tablet 25 mg  25 mg Oral BID Elodia Florence., MD      . ondansetron [facility]) injection 4 mg  4 mg Intravenous Q6H PRN Opyd, Ilene Qua, MD      . pramipexole (MIRAPEX) tablet 0.5 mg  0.5 mg Oral BID Opyd, Ilene Qua, MD   0.5 mg at 06/30/18 0948  . senna (SENOKOT) tablet 17.2 mg  2 tablet Oral BID Opyd, Ilene Qua, MD   17.2 mg at 06/30/18 0948  . sodium chloride flush (NS) 0.9 % injection 3 mL  3 mL Intravenous Q12H Opyd, Ilene Qua, MD   3 mL at 06/30/18 0948  . sodium chloride flush (NS) 0.9 % injection 3 mL  3 mL Intravenous PRN Opyd, Ilene Qua, MD      . tamsulosin (FLOMAX) capsule 0.4 mg  0.4 mg Oral QPC supper Opyd, Ilene Qua, MD   0.4 mg at 06/29/18 1807     Discharge Medications: Please see discharge summary for a list of  discharge medications.  Relevant Imaging Results:  Relevant Lab Results:   Additional Information SS#: [id number]  Jorge Ny, LCSW

## 2018-06-30 NOTE — Progress Notes (Signed)
Orthopedic Tech Progress Note Patient Details:  Travis [name] [birth year] [id number]  Ortho Devices Type of Ortho Device: Haematologist Ortho Device/Splint Location: bilateral Ortho Device/Splint Interventions: Application   Post Interventions Patient Tolerated: Well Instructions Provided: Care of device   [name] 06/30/2018, 10:28 AM

## 2018-07-01 DIAGNOSIS — G4733 Obstructive sleep apnea (adult) (pediatric): Secondary | ICD-10-CM

## 2018-07-01 DIAGNOSIS — J9622 Acute and chronic respiratory failure with hypercapnia: Secondary | ICD-10-CM

## 2018-07-01 DIAGNOSIS — Z9989 Dependence on other enabling machines and devices: Secondary | ICD-10-CM

## 2018-07-01 DIAGNOSIS — J9621 Acute and chronic respiratory failure with hypoxia: Secondary | ICD-10-CM

## 2018-07-01 DIAGNOSIS — I5033 Acute on chronic diastolic (congestive) heart failure: Secondary | ICD-10-CM

## 2018-07-01 DIAGNOSIS — J984 Other disorders of lung: Secondary | ICD-10-CM

## 2018-07-01 LAB — CBC
HCT: 37.1 % — ABNORMAL LOW (ref 39.0–52.0)
HEMOGLOBIN: 10.6 g/dL — AB (ref 13.0–17.0)
MCH: 28 pg (ref 26.0–34.0)
MCHC: 28.6 g/dL — AB (ref 30.0–36.0)
MCV: 97.9 fL (ref 78.0–100.0)
Platelets: 157 10*3/uL (ref 150–400)
RBC: 3.79 MIL/uL — ABNORMAL LOW (ref 4.22–5.81)
RDW: 15.7 % — ABNORMAL HIGH (ref 11.5–15.5)
WBC: 4.8 10*3/uL (ref 4.0–10.5)

## 2018-07-01 LAB — BASIC METABOLIC PANEL
ANION GAP: 10 (ref 5–15)
BUN: 38 mg/dL — ABNORMAL HIGH (ref 8–23)
CALCIUM: 8.9 mg/dL (ref 8.9–10.3)
CO2: 41 mmol/L — AB (ref 22–32)
Chloride: 94 mmol/L — ABNORMAL LOW (ref 98–111)
Creatinine, Ser: 1.38 mg/dL — ABNORMAL HIGH (ref 0.61–1.24)
GFR calc Af Amer: 57 mL/min — ABNORMAL LOW (ref >60)
GFR calc non Af Amer: 49 mL/min — ABNORMAL LOW (ref >60)
GLUCOSE: 147 mg/dL — AB (ref 70–99)
Potassium: 3.8 mmol/L (ref 3.5–5.1)
Sodium: 145 mmol/L (ref 135–145)

## 2018-07-01 LAB — GLUCOSE, CAPILLARY
Glucose-Capillary: 227 mg/dL — ABNORMAL HIGH (ref 70–99)
Glucose-Capillary: 189 mg/dL — ABNORMAL HIGH (ref 70–99)
Glucose-Capillary: 196 mg/dL — ABNORMAL HIGH (ref 70–99)
Glucose-Capillary: 220 mg/dL — ABNORMAL HIGH (ref 70–99)
Glucose-Capillary: 160 mg/dL — ABNORMAL HIGH (ref 70–99)

## 2018-07-01 LAB — MAGNESIUM: MAGNESIUM: 2.4 mg/dL (ref 1.7–2.4)

## 2018-07-01 MED ORDER — INSULIN GLARGINE 100 UNIT/ML ~~LOC~~ SOLN
5.0000 [IU] | Freq: Every day | SUBCUTANEOUS | Status: DC
  Administered 2018-07-01 – 2018-07-03 (×3): 5 [IU] via SUBCUTANEOUS
  Filled 2018-07-01 (×3): qty 0.05

## 2018-07-01 MED ORDER — INSULIN ASPART 100 UNIT/ML ~~LOC~~ SOLN
0.0000 [IU] | Freq: Three times a day (TID) | SUBCUTANEOUS | Status: DC
  Administered 2018-07-01: 2 [IU] via SUBCUTANEOUS
  Administered 2018-07-01: 3 [IU] via SUBCUTANEOUS
  Administered 2018-07-02: 2 [IU] via SUBCUTANEOUS
  Administered 2018-07-02: 1 [IU] via SUBCUTANEOUS
  Administered 2018-07-02: 2 [IU] via SUBCUTANEOUS
  Administered 2018-07-03: 1 [IU] via SUBCUTANEOUS
  Administered 2018-07-03 (×2): 2 [IU] via SUBCUTANEOUS
  Administered 2018-07-04: 3 [IU] via SUBCUTANEOUS
  Administered 2018-07-04: 2 [IU] via SUBCUTANEOUS

## 2018-07-01 MED ORDER — TORSEMIDE 20 MG PO TABS
40.0000 mg | ORAL_TABLET | Freq: Two times a day (BID) | ORAL | Status: DC
  Administered 2018-07-01 – 2018-07-04 (×7): 40 mg via ORAL
  Filled 2018-07-01 (×8): qty 2

## 2018-07-01 MED ORDER — INSULIN ASPART 100 UNIT/ML ~~LOC~~ SOLN: 0.0000 [IU] | Freq: Every day | SUBCUTANEOUS | Status: DC

## 2018-07-01 MED ORDER — GERHARDT'S BUTT CREAM
TOPICAL_CREAM | Freq: Two times a day (BID) | CUTANEOUS | Status: DC
Start: 2018-07-01 — End: 2018-07-04
  Administered 2018-07-01 – 2018-07-04 (×7): via TOPICAL
  Filled 2018-07-01: qty 1

## 2018-07-01 NOTE — Care Management Note (Signed)
Case Management Note  Patient Details  Name: YAVUZ KIRBY MRN: [id number] Date of Birth: 1946-06-26  Subjective/Objective:                    Action/Plan: CM consulted for Bipap. Pt to d/c to SNF when medically ready. CM updated CSW and they will follow up with the Bipap at the SNF.   Expected Discharge Date:                  Expected Discharge Plan:  Skilled Nursing Facility  In-House Referral:  Clinical Social Work  Discharge planning Services  CM Consult  Post Acute Care Choice:  Durable Medical Equipment Choice offered to:     DME Arranged:    DME Agency:     HH Arranged:    [place] Agency:     Status of Service:  In process, will continue to follow  If discussed at Long Length of Stay Meetings, dates discussed:    Additional Comments:  Pollie Friar, RN 07/01/2018, 10:58 AM

## 2018-07-01 NOTE — Progress Notes (Signed)
OT Note - Addendum    07/01/18 1600  OT Visit Information  Last OT Received On 07/01/18  OT Time Calculation  OT Start Time (ACUTE ONLY) 1419  OT Stop Time (ACUTE ONLY) 1440  OT Time Calculation (min) 21 min  OT General Charges  $OT Visit 1 Visit  OT Evaluation  $OT Eval Moderate Complexity 1 Mod  Maurie Boettcher, OT/L  OT Clinical Specialist [phone]

## 2018-07-01 NOTE — Progress Notes (Signed)
PROGRESS NOTE    Travis [name]  PJK:[id number] DOB: July 23, 1946 DOA: 06/28/2018 PCP: [name], MD   Brief Narrative:  Travis [name] is Travis [name] 72 y.o. male with medical history significant for type 2 diabetes mellitus, hypothyroidism, hypertension, chronic kidney disease stage III, COPD, and chronic hypoxic respiratory failure, now presenting to the emergency department for evaluation of somnolence and confusion.  History is obtained primarily from the patient's wife and daughter at the bedside as well as EMR and discussion with ED personnel.  Patient had back surgery in April and has reportedly been more fatigued since that time, but this has progressed significantly over the past week or so, over which interval he has been sleeping most of the day and seems to be mildly confused at times.  He has been noted to wake from sleep acutely short of breath multiple times every night for the past few days.  He has had progressive bilateral lower extremity swelling and recently had diuretics increased for this reason, but with no appreciable improvement.  He was diagnosed with UTI on 06/25/2018 and started on ciprofloxacin.  His condition has not improved with treatment of this.  He denies chest pain, fevers, or chills.   Assessment & Plan:   Principal Problem:   Acute encephalopathy Active Problems:   OSA on CPAP   Hypothyroid   Elevated troponin I level   Urinary tract infection without hematuria   Stage 3 chronic kidney disease (HCC)   Acute on chronic respiratory failure with hypoxia and hypercapnia (HCC)   Benign essential HTN   Acute on chronic diastolic (congestive) heart failure (HCC)   Poorly controlled type 2 diabetes mellitus with peripheral neuropathy (HCC)   Metabolic encephalopathy due to CO2 retention/hypercarbic respiratory failure - continue BIPAP nightly - c/s care management for BIPAP at home (will need to get updated settings to order DME for SNF) - normal B12, folate,  RPR, TSH.  Ammonia very mildly elevated.  Acute Hypoxic Hypercarbic Respiratory Failure: Presented with hypercarbia and now on 10 L by [facility] (on 2 L by [facility] at home) Diuresing as noted below Follows with pulm as outpatient.  Severe obstructive disease as well as severe restriction.  Will continue diuresis as noted below and consult pulmonology for his COPD Wean O2 as tolerated  Acute on chronic diastolic CHF, history of pulmonary hypertension - Has increased peripheral edema despite recent increase in torsemide, he also has home health RN placed wrap on his legs started two weeks ago, he reports wraps are changed twice Travis [name] week -cxr "BILATERAL pulmonary opacities are seen representing vascular congestion, similar aeration to priors. Bibasilar atelectasis is superimposed." -BNP 319, elevated when compared to priors -IV Lasix, continue at 60 -> being transitioned to PO torsemide 40 BID (was on 40 BID with 60 BID MWF) -Continue beta-blocker -Echocardiogram with normal EF, grade 1 diastolic dysfunction (tech diff study, see below) - LE dopplers negative for DVT - Cardiology c/s appreciate recs  Wt Readings from Last 3 Encounters:  07/01/18 128.1 kg (282 lb 6.6 oz)  05/20/18 127 kg (280 lb)  05/18/18 125.2 kg (276 lb)   Bradycardia: sinus brady on tele, decrease metoprolol to 25 mg BID.  Continue to monitor.  Coronary artery disease, cardiac cath in 2016, nonobstructive CAD -No chest pain -Troponin mild and flat at 0.03 -EKG with chronic right bundle branch block.  PVCs, no acute ST-T changes -Continue aspirin /beta-blocker  HTN: stable on lopressor/hydralazine, diuretics  Non-insulin-dependent type 2 diabetes -A1c 7.7%  in March 2019 -Hold home oral meds glimepiride -SSI while in the hospital - will add 5 units lantus - continue gabapentin (? Contribution to edema)  CKD 3 -Creatinine at baseline (improved with diuresis), renally dose medications -He was recently diagnosed with  UTI on June 28, he was started on ciprofloxacin - has abx to continue course -Currently no urinary symptoms, UA obtained in the ED unremarkable, no fever no leukocytosis -Follow urine culture result  BPH on Flomax  Hypothyroidism, TSH 1.17, continue Synthroid  OSA on CPAP at night Body mass index is 37.2 kg/m.  Decubitus ulcer  LE wounds: Wound c/s, appreciate recs.  Also has LE wound (had toenail removed recently).  Frequent turns.    DVT prophylaxis: heparin Code Status: full  Family Communication: discussed with wife over phone 7/3 Disposition Plan: pending improvement in respiratory status   Consultants:   Cardiology  Procedures:  Echo Study Conclusions  - Left ventricle: The cavity size was normal. Wall thickness was   normal. Systolic function was normal. The estimated ejection   fraction was in the range of 55% to 60%. Wall motion was normal;   there were no regional wall motion abnormalities. Doppler   parameters are consistent with abnormal left ventricular   relaxation (grade 1 diastolic dysfunction).  Impressions:  - Technically difficult; definity used; normal LV systolic   function; mild diastolic dysfunction.  LE [place] Final Interpretation: Right: There is no evidence of deep vein thrombosis in the lower extremity. No cystic structure found in the popliteal fossa. Left: There is no evidence of deep vein thrombosis in the lower extremity. No cystic structure found in the popliteal fossa.   Antimicrobials: Anti-infectives (From admission, onward)   Start     Dose/Rate Route Frequency Ordered Stop   06/28/18 2200  ciprofloxacin (CIPRO) tablet 500 mg    Note to Pharmacy:  For 7 days     500 mg Oral 2 times daily 06/28/18 2146 07/02/18 2159         Subjective: No complaints today Overall feeling better  Objective: Vitals:   07/01/18 0428 07/01/18 0637 07/01/18 0748 07/01/18 0748  BP:  (!) 137/57  139/60  Pulse:    63  Resp:    20  Temp:     98.4 F (36.9 C)  TempSrc:    Oral  SpO2:  91% 95% 96%  Weight: 128.1 kg (282 lb 6.6 oz)     Height:        Intake/Output Summary (Last 24 hours) at 07/01/2018 1051 Last data filed at 07/01/2018 0700 Gross per 24 hour  Intake 716 ml  Output 3125 ml  Net -2409 ml   Filed Weights   06/29/18 1600 07/01/18 0428  Weight: 127.9 kg (281 lb 15.5 oz) 128.1 kg (282 lb 6.6 oz)    Examination:  General: No acute distress. Cardiovascular: Heart sounds show Travis [name] regular rate, and rhythm. Lungs: Clear to auscultation bilaterally with good air movement. Abdomen: Soft, nontender, nondistended with normal active bowel sounds. Neurological: Alert and oriented 3. Moves all extremities 4. Cranial nerves II through XII grossly intact. Skin: R big toe with dressing, sacral wound Extremities: Bilateral unna boots Psychiatric: Mood and affect are normal. Insight and judgment are approriate.     Data Reviewed: I have personally reviewed following labs and imaging studies  CBC: Recent Labs  Lab 06/28/18 1731 06/29/18 0314 07/01/18 0215  WBC 4.5  --  4.8  NEUTROABS 3.5  --   --  HGB 11.6*  --  10.6*  HCT 41.3 33.8* 37.1*  MCV 97.2  --  97.9  PLT 202  --  361   Basic Metabolic Panel: Recent Labs  Lab 06/28/18 1949 06/29/18 0314 06/30/18 0337 07/01/18 0215  NA 146* 146* 144 145  K 4.7 4.3 3.8 3.8  CL 99 101 95* 94*  CO2 36* 37* 40* 41*  GLUCOSE 217* 156* 121* 147*  BUN 51* 50* 44* 38*  CREATININE 1.78* 1.61* 1.56* 1.38*  CALCIUM 9.1 8.7* 8.4* 8.9  MG  --   --   --  2.4   GFR: Estimated Creatinine Clearance: 66.9 mL/min (Travis [name]) (by C-G formula based on SCr of 1.38 mg/dL (H)). Liver Function Tests: Recent Labs  Lab 06/28/18 1949  AST 21  ALT 26  ALKPHOS 87  BILITOT 0.8  PROT 7.7  ALBUMIN 3.8   No results for input(s): LIPASE, AMYLASE in the last 168 hours. Recent Labs  Lab 06/28/18 1744 06/29/18 0314  AMMONIA 29 36*   Coagulation Profile: No results for input(s):  INR, PROTIME in the last 168 hours. Cardiac Enzymes: Recent Labs  Lab 06/28/18 1949 06/29/18 0127 06/29/18 0723 06/29/18 1235  TROPONINI 0.03* 0.03* 0.03* 0.03*   BNP (last 3 results) No results for input(s): PROBNP in the last 8760 hours. HbA1C: No results for input(s): HGBA1C in the last 72 hours. CBG: Recent Labs  Lab 06/30/18 0734 06/30/18 1125 06/30/18 1636 06/30/18 2157 07/01/18 0748  GLUCAP 133* 153* 227* 183* 227*   Lipid Profile: No results for input(s): CHOL, HDL, LDLCALC, TRIG, CHOLHDL, LDLDIRECT in the last 72 hours. Thyroid Function Tests: Recent Labs    06/29/18 0314  TSH 1.171   Anemia Panel: Recent Labs    06/29/18 0314  VITAMINB12 394   Sepsis Labs: Recent Labs  Lab 06/28/18 1744  LATICACIDVEN 0.9    Recent Results (from the past 240 hour(s))  MRSA PCR Screening     Status: None   Collection Time: 06/29/18  6:25 PM  Result Value Ref Range Status   MRSA by PCR NEGATIVE NEGATIVE Final    Comment:        The GeneXpert MRSA Assay (FDA approved for NASAL specimens only), is one component of Travis [name] comprehensive MRSA colonization surveillance program. It is not intended to diagnose MRSA infection nor to guide or monitor treatment for MRSA infections. Performed at [facility], [place] [address] [place], [place] [address]          Radiology Studies: No results found.      Scheduled Meds: . aspirin EC  81 mg Oral Daily  . ciprofloxacin  500 mg Oral BID  . escitalopram  10 mg Oral Daily  . fesoterodine  8 mg Oral Daily  . gabapentin  400 mg Oral TID  . Gerhardt's butt cream   Topical BID  . heparin  5,000 Units Subcutaneous Q8H  . hydrALAZINE  50 mg Oral Q8H  . insulin aspart  0-5 Units Subcutaneous QHS  . insulin aspart  0-9 Units Subcutaneous TID WC  . insulin glargine  5 Units Subcutaneous QHS  . levothyroxine  175 mcg Oral QAC breakfast  . mouth rinse  15 mL Mouth Rinse BID  . metoprolol tartrate  25 mg Oral BID  .  pramipexole  0.5 mg Oral BID  . senna  2 tablet Oral BID  . sodium chloride flush  3 mL Intravenous Q12H  . tamsulosin  0.4 mg Oral QPC supper  .  torsemide  40 mg Oral BID   Continuous Infusions: . sodium chloride       LOS: 3 days    Time spent: over 30 min    Travis Helper, MD [facility] Pager [phone]  If 7PM-7AM, please contact night-coverage [email or web address] Password [email or web address] 07/01/2018, 10:51 AM

## 2018-07-01 NOTE — Progress Notes (Signed)
CSW initiated [employer] authorization for possible DC next 1-2 days- family confirms [facility] as top choice and they can make a bed offer  Jorge Ny, [place] Social Worker [phone]

## 2018-07-01 NOTE — Progress Notes (Addendum)
Occupational Therapy Evaluation Patient Details Name: Travis [name] MRN: [id number] DOB: [birth year] Today's Date: 07/01/2018    History of Present Illness Pt is a 72 y.o. M with significant PMH of type 2 diabetes mellitus, hypothyroidism, hypertension, chronic kidney disease stage III, COPD, chronic hypoxic respiratory failure, recent back surgery (4/19) who presents for evaluation of somnolence and confusion.   Clinical Impression   PTA, pt was living at home with wife, and required assistance with ADLs and ambulated short distances in household at [facility] level with assistance of family. Daughter in law states pt was primarily using his electric scooter and had become weaker over the last 2 weeks, requiring more physical assistance with mobility. Session limited by pt desaturation while sitting in recliner SpO2 66 4lnc - nursing notified and increased O2 to 10lnc increasing pt SpO2 to 94. Pt currently requires maxA +2 for physical assistance for sit to stand with use of Denna Haggard and Max A with LB ADL.  Due to decline in current level of function, pt would benefit from rehab at SNF to facilitate return to PLOF. Acute OT to address establish goals to facilitate safe D/C to next venue of care.      Follow Up Recommendations  Supervision/Assistance - 24 hour;SNF    Equipment Recommendations  None recommended by OT    Recommendations for Other Services       Precautions / Restrictions Precautions Precautions: Fall Restrictions Weight Bearing Restrictions: No      Mobility Bed Mobility               General bed mobility comments: pt sitting in recliner upon arrival  Transfers Overall transfer level: Needs assistance Equipment used:  Transfers: Sit to/from Stand Sit to Stand: Max assist;+2 physical assistance - unable to clear buttocks from chair        General transfer comment:  pt reported his daughter in-law transfered him from bed to recliner;attempted to stand with use  of stedy;pt required maxA +2 for physical assistance to elevated bottom off of recliner;    Balance Overall balance assessment: Needs assistance Sitting-balance support: Bilateral upper extremity supported;Feet supported Sitting balance-Leahy Scale: Fair     Standing balance support: Bilateral upper extremity supported Standing balance-Leahy Scale: Poor Standing balance comment: reliant on external support                           ADL either performed or assessed with clinical judgement   ADL Overall ADL's : Needs assistance/impaired Eating/Feeding: Set up;Sitting   Grooming: Set up;Sitting   Upper Body Bathing: Set up;Sitting   Lower Body Bathing: Moderate assistance Lower Body Bathing Details (indicate cue type and reason): pt able to reach to mid shin; Upper Body Dressing : Set up;Sitting   Lower Body Dressing: Maximal assistance;+2 for physical assistance;Sit to/from stand   Toilet Transfer: Maximal assistance;+2 for physical assistance;Stand-pivot   Toileting- Clothing Manipulation and Hygiene: Maximal assistance;+2 for physical assistance;Sit to/from stand  foley     Functional mobility during ADLs: Maximal assistance;+2 for physical assistance       Vision         Perception     Praxis      Pertinent Vitals/Pain Pain Assessment: No/denies pain     Hand Dominance Right   Extremity/Trunk Assessment Upper Extremity Assessment Upper Extremity Assessment: Overall WFL for tasks assessed   Lower Extremity Assessment Lower Extremity Assessment: Generalized weakness;RLE deficits/detail;LLE deficits/detail RLE Deficits / Details: pt  required assistance to position feet on stedy in preparation for sit<>stand RLE Coordination: decreased gross motor LLE Deficits / Details: pt required assistance to position feet on stedy in preparation for sit<>stand LLE Coordination: decreased gross motor   Cervical / Trunk Assessment Cervical / Trunk Assessment:  Kyphotic(required reclined position in chair to look up at OT)   Communication Communication Communication: No difficulties   Cognition Arousal/Alertness: Awake/alert Behavior During Therapy: WFL for tasks assessed/performed Overall Cognitive Status: Within Functional Limits for tasks assessed                                     General Comments  pt desatting while sitting in recliner SpO2 84% 4lnc upon arrival;educated pt on pursed lip breathing, SpO2 reached 91% 4lnc; nsg notified, increased O2 to 10lnc SpO2 95%;    Exercises     Shoulder Instructions      Home Living Family/patient expects to be discharged to:: Private residence Living Arrangements: Spouse/significant other;Children Available Help at Discharge: Family;Available 24 hours/day;Personal care attendant Type of Home: House Home Access: Ramped entrance     Home Layout: One level     Bathroom Shower/Tub: Tub/shower unit;Walk-in shower;Curtain   Bathroom Toilet: Handicapped height Bathroom Accessibility: Yes   Home Equipment: Environmental consultant - 2 wheels;Walker - 4 wheels;Cane - single point;Bedside commode;Wheelchair - manual;Shower seat - built in;Grab bars - tub/shower   Additional Comments: pt had caregivers 3x a week for bathing.  pt's wife was able to assist with transfers and toileting. pt mostly w/c level      Prior Functioning/Environment Level of Independence: Needs assistance  Gait / Transfers Assistance Needed: Pt can transfer with assist but has not walked functionally in several months, pt was vague with specific amount of time, pt reports use of w/c for mobility ADL's / Homemaking Assistance Needed: assist for ADL's   Comments: per conversation with daughter in law, family assists with transfers by "pull on him", she also reports family terminated use of sliding board secondary to minor cuts on pt during use;        OT Problem List: Decreased strength;Decreased activity  tolerance;Decreased range of motion;Impaired balance (sitting and/or standing);Cardiopulmonary status limiting activity      OT Treatment/Interventions: Self-care/ADL training;Therapeutic exercise;Neuromuscular education;Energy conservation;DME and/or AE instruction;Therapeutic activities;Patient/family education;Balance training    OT Goals(Current goals can be found in the care plan section) Acute Rehab OT Goals Patient Stated Goal: walk OT Goal Formulation: With patient Time For Goal Achievement: 07/15/18 Potential to Achieve Goals: Good  OT Frequency: Min 2X/week   Barriers to D/C:            Co-evaluation              AM-PAC PT "6 Clicks" Daily Activity     Outcome Measure Help from another person eating meals?: None Help from another person taking care of personal grooming?: A Little Help from another person toileting, which includes using toliet, bedpan, or urinal?: A Lot Help from another person bathing (including washing, rinsing, drying)?: A Lot Help from another person to put on and taking off regular upper body clothing?: A Little Help from another person to put on and taking off regular lower body clothing?: A Lot 6 Click Score: 16   End of Session Equipment Utilized During Treatment: Gait belt;Oxygen Nurse Communication: Mobility status;Other (comment)(desat)  Activity Tolerance: Treatment limited secondary to medical complications (Comment)(desat ) Patient left: in  chair;with call bell/phone within reach  OT Visit Diagnosis: Unsteadiness on feet (R26.81);Other abnormalities of gait and mobility (R26.89);Muscle weakness (generalized) (M62.81)                Time: 6948-5462 OT Time Calculation (min): 21 min Charges:    G-Codes:     Dorinda Hill OTS    Dorinda Hill   Agree with above assessment.  Maurie Boettcher, OT/L  OT Clinical Specialist [phone]  07/01/2018, 4:00 PM

## 2018-07-01 NOTE — Consult Note (Signed)
PULMONARY / CRITICAL CARE MEDICINE   Name: Travis [name] MRN: [id number] DOB: September 13, 1946    ADMISSION DATE:  06/28/2018 CONSULTATION DATE:  07/01/18  REFERRING MD:  Acute respiratory failure/ hypoxemic and hypercarbic   CHIEF COMPLAINT:  sob  HISTORY OF PRESENT ILLNESS:   71 yowm remote smoker eval by [facility] in pulmonary clinic initially 10/30/17 with dx of restrictive lung defect with minimal concavity to f/v loop c/w small airways dz but not dx of copd, rec trial of stiolto which did not appear to help, and downhill x 2 months with worsening sob/ leg swelling with confusion / hypersomnolence documented on admit  but pt denies  cough /wheeze and admitted with acute resp failur e7/1/19 and better on bipap, transitioned to cpap and 24/7 02 but still needing 5lpm so pccm service consulted am 07/01/18.   No obvious day to day or daytime variability or assoc excess/ purulent sputum or mucus plugs or hemoptysis or cp or chest tightness, subjective wheeze or overt sinus or hb symptoms.     Also denies any obvious fluctuation of symptoms with weather or environmental changes or other aggravating or alleviating factors except as outlined above   No unusual exposure hx or h/o childhood pna/ asthma or knowledge of premature birth.  Current Allergies, Complete Past Medical History, Past Surgical History, Family History, and Social History were reviewed in [employer] record.  ROS  The following are not active complaints unless bolded Hoarseness, sore throat, dysphagia, dental problems, itching, sneezing,  nasal congestion or discharge of excess mucus or purulent secretions, ear ache,   fever, chills, sweats, unintended wt loss or wt gain, classically pleuritic or exertional cp,  orthopnea pnd or arm/hand swelling  or leg swelling, presyncope, palpitations, abdominal pain, anorexia, nausea, vomiting, diarrhea  or change in bowel habits or change in bladder habits, change in stools or  change in urine, dysuria, hematuria,  rash, arthralgias, visual complaints, headache, numbness, weakness or ataxia or problems with walking or coordination,  change in mood or  memory.            PAST MEDICAL HISTORY :  He  has a past medical history of Cervical myelopathy (HCC), CHF (congestive heart failure) ([place]), Constipation - functional, COPD (chronic obstructive pulmonary disease) ([place]), DJD (degenerative joint disease), Dyspnea, Gait disorder, Heart murmur, Hypertension, Hypothyroidism, Lumbosacral spinal stenosis (03/13/2014), On home oxygen therapy, Peripheral neuropathy, Pneumonia, PONV (postoperative nausea and vomiting), RBBB, Renal disorder, Restless legs syndrome (03/14/2015), RLS (restless legs syndrome), Sleep apnea, Thoracic myelopathy, and Type II diabetes mellitus ([place]).  PAST SURGICAL HISTORY: He  has a past surgical history that includes Cholecystectomy; Lumbar laminectomy/decompression microdiscectomy (07/14/2012); Cardiac catheterization ([name], 09/07/2015); Eye surgery; Posterior lumbar fusion (04/08/2018); Posterior lumbar fusion (04/08/2018); Back surgery; and Posterior cervical fusion/foraminotomy (04/08/2018).  Allergies  Allergen Reactions  . Other Other (See Comments)    ANESTHESIA UNSPECIFIED  -- makes sick, must have antinausea medication in advance.  . Levofloxacin In D5w Itching    Possible infusion reaction 03/29/17. Tolerating oral Levaquin 03/30/17    No current facility-administered medications on file prior to encounter.    Current Outpatient Medications on File Prior to Encounter  Medication Sig  . aspirin EC 81 MG EC tablet Take 1 tablet (81 mg total) by mouth daily.  . ciprofloxacin (CIPRO) 500 MG tablet Take 500 mg by mouth 2 (two) times daily. For 7 days  . escitalopram (LEXAPRO) 10 MG tablet Take 1 tablet (10 mg total) by mouth daily.  [name]  gabapentin (NEURONTIN) 400 MG capsule TAKE 1 CAPSULE (400 MG TOTAL) BY MOUTH EVERY 8 (EIGHT) HOURS.  [name] glimepiride  (AMARYL) 2 MG tablet Take 1 tablet (2 mg total) by mouth daily with breakfast.  . hydrALAZINE (APRESOLINE) 50 MG tablet Take 1 tablet (50 mg total) by mouth every 8 (eight) hours.  [name] levothyroxine (SYNTHROID, LEVOTHROID) 175 MCG tablet Take 1 tablet (175 mcg total) by mouth daily.  . metoprolol tartrate (LOPRESSOR) 50 MG tablet Take 1 tablet (50 mg total) by mouth 2 (two) times daily.  . polyethylene glycol (MIRALAX / GLYCOLAX) packet Take 17 g by mouth daily as needed. For constipation  . potassium chloride SA (K-DUR,KLOR-CON) 20 MEQ tablet Take 1 tablet (20 mEq total) by mouth 2 (two) times daily.  . pramipexole (MIRAPEX) 0.5 MG tablet Take 1 tablet (0.5 mg total) by mouth 2 (two) times daily.  [name] senna (SENOKOT) 8.6 MG TABS tablet Take 2 tablets (17.2 mg total) by mouth 2 (two) times daily.  . tamsulosin (FLOMAX) 0.4 MG CAPS capsule Take 1 capsule (0.4 mg total) by mouth daily.  [name] torsemide (DEMADEX) 20 MG tablet Take 60 mg twice daily on M, W, F and 40 mg twice daily on other days  . TOVIAZ 8 MG TB24 tablet Take 1 tablet (8 mg total) by mouth daily.    FAMILY HISTORY:  His indicated that his mother is deceased. He indicated that his father is deceased. He indicated that all of his four brothers are alive. He indicated that his maternal grandmother is deceased. He indicated that his maternal grandfather is deceased. He indicated that his paternal grandmother is deceased. He indicated that his paternal grandfather is deceased. He indicated that the status of his unknown relative is unknown.   SOCIAL HISTORY: He  reports that he quit smoking about 33 years ago. His smoking use included cigarettes. He has a 40.00 pack-year smoking history. He quit smokeless tobacco use about 33 years ago. He reports that he does not drink alcohol or use drugs.     SUBJECTIVE:  Obese wm comfortable on 5lpm NP in recliner at 60 degrees upright  VITAL SIGNS: BP 127/60 (BP Location: Right Arm)   Pulse (!) 57    Temp 97.7 F (36.5 C) (Oral)   Resp 18   Ht 6\' 1"  (1.854 m)   Wt 282 lb 6.6 oz (128.1 kg)   SpO2 91%   BMI 37.26 kg/m      Wt Readings from Last 3 Encounters:  07/01/18 282 lb 6.6 oz (128.1 kg)  05/20/18 280 lb (127 kg)  05/18/18 276 lb (125.2 kg)      Intake/Output Summary (Last 24 hours) at 07/01/2018 1454 Last data filed at 07/01/2018 0700 Gross per 24 hour  Intake 480 ml  Output 3125 ml  Net -2645 ml    INTAKE / OUTPUT: I/O last 3 completed shifts: In: 50 [P.O.:956] Out: 5125 [Urine:5125]  PHYSICAL EXAMINATION: General:  Elderly obese wm nad Neuro:  Alert/approp/ no motor def HEENT:  Oropharynx clear  Cardiovascular:  RRR distant s 1s2 Lungs:  Distant bs, dullness bases with min insp crackles bases bilatreally Abdomen:  Obese/ soft/ limited excursion Musculoskeletal:  No calf tenderness/ ace wrap both legs still pitting palpable and equal bilaterally  Skin:  No breakdown   LABS:  BMET Recent Labs  Lab 06/29/18 0314 06/30/18 0337 07/01/18 0215  NA 146* 144 145  K 4.3 3.8 3.8  CL 101 95* 94*  CO2 37* 40* 41*  BUN 50* 44* 38*  CREATININE 1.61* 1.56* 1.38*  GLUCOSE 156* 121* 147*    Electrolytes Recent Labs  Lab 06/29/18 0314 06/30/18 0337 07/01/18 0215  CALCIUM 8.7* 8.4* 8.9  MG  --   --  2.4    CBC Recent Labs  Lab 06/28/18 1731 06/29/18 0314 07/01/18 0215  WBC 4.5  --  4.8  HGB 11.6*  --  10.6*  HCT 41.3 33.8* 37.1*  PLT 202  --  157    Coag's No results for input(s): APTT, INR in the last 168 hours.  Sepsis Markers Recent Labs  Lab 06/28/18 1744  LATICACIDVEN 0.9    ABG Recent Labs  Lab 06/28/18 2146 06/29/18 0129 06/29/18 0448  PHART 7.310* 7.348* 7.400  PCO2ART 78.0* 72.0* 68.1*  PO2ART 85.0 66.0* 71.0*    Liver Enzymes Recent Labs  Lab 06/28/18 1949  AST 21  ALT 26  ALKPHOS 87  BILITOT 0.8  ALBUMIN 3.8    Cardiac Enzymes Recent Labs  Lab 06/29/18 0127 06/29/18 0723 06/29/18 1235  TROPONINI 0.03*  0.03* 0.03*    Glucose Recent Labs  Lab 06/30/18 1125 06/30/18 1636 06/30/18 2157 07/01/18 0748 07/01/18 1123 07/01/18 1303  GLUCAP 153* 227* 183* 227* 189* 196*    Imaging No results found.   Studies: CT abd w/o contrast 06/28/18 : Dep atx bases only Echo 06/29/18: Left ventricle: The cavity size was normal. Wall thickness was   normal. Systolic function was normal. The estimated ejection   fraction was in the range of 55% to 60%. Wall motion was normal;   there were no regional wall motion abnormalities. Doppler   parameters are consistent with abnormal left ventricular   relaxation (grade 1 diastolic dysfunction).  DISCUSSION: Pt with MO complicated by diastolic dysfunction and ohs/ restrictive physiology admitted now with acute hypoxemic/hypercabic resp failure decompensated likely dut to fluid retention and may need transition from cpap to bipap sooner rather than later althought qualifying can be tricky and likely will require formal sleep evel/ titration study since already carries dx of OSA  ASSESSMENT / PLAN:   1)  Acute on chronic resp failure hypoxemic and hypercarbic - TSH nl this admit - continue efforts to diures but consider adding aldactone if refractory to lasix as offset the reflex hyperaldo state that exists in this setting and is made worse by lasix   2) OSA/ probable now OHS > consider transition to Bipap if eligible > will need to be seen by one of our sleep docs prior to d/c and in meantime titrate 02 to upper 80s is fine  3) no evidence of copd, no need for bronchodilators at this point     Christinia Gully, MD Pulmonary and [place] [phone] After 5:30 PM or weekends, use [facility] [phone]

## 2018-07-01 NOTE — Progress Notes (Signed)
RN went into assess pt as his pulse ox was not picking up. Pt had removed his CPAP and did not have his nasal cannula back on. Pts O2 dropped to the 60s. Pt up to [facility], pt maintaining sats at 85%. RT was called and pt was put on 10L HFNC and tolerating well at 92%. Pt educated to call for assistance in the future when he is ready to take his CPAP mask off. Will continue to monitor. Clint Bolder, RN 07/01/18 6:57 AM

## 2018-07-01 NOTE — Consult Note (Signed)
[place] Nurse wound consult note Reason for Consult: Moisture associated skin damage to buttocks and coccyx, present on admission.  He had been treating at home with diaper rash balm. Wound type:MASD  Pressure Injury POA: NA Measurement:Bilateral buttocks 2 cm x 1 cm x 0.1 cm in gluteal fold.  Apex gluteal cleft:  1 cm x 0.5 cm x 0.1 cm  Wound HTX:HFSF and moist Drainage (amount, consistency, odor) minimal serosanguinous   Periwound: erythema Dressing procedure/placement/frequency: Keep skin clean of urine or perspiration.  Apply Gerhardts butt paste twice daily.  Will not follow at this time.  Please re-consult if needed.  Domenic Moras RN BSN [place] Pager [phone]

## 2018-07-01 NOTE — Progress Notes (Signed)
Progress Note  Patient Name: Travis [name] Date of Encounter: 07/01/2018  Primary Cardiologist: Mertie Moores, MD   Subjective   72 yo man with chronic diastolic CHF, HTN, hypothyroidism, DM OSA, mild pulmonary HTN   He was admitted several days ago with altered mental status. , somnolence and confusion.   He was found to have hypercapnea.   He has improved with diuresis and BiPAP therapy.  Primary function test has revealed severe COPD with an FEV1 of 42% predicted, and severe reduction of diffusion capacity  Has diuresed > 8 liters, Feeling much better   Inpatient Medications    Scheduled Meds: . aspirin EC  81 mg Oral Daily  . ciprofloxacin  500 mg Oral BID  . escitalopram  10 mg Oral Daily  . fesoterodine  8 mg Oral Daily  . furosemide  60 mg Intravenous BID  . gabapentin  400 mg Oral TID  . heparin  5,000 Units Subcutaneous Q8H  . hydrALAZINE  50 mg Oral Q8H  . insulin aspart  0-9 Units Subcutaneous TID WC  . levothyroxine  175 mcg Oral QAC breakfast  . mouth rinse  15 mL Mouth Rinse BID  . metoprolol tartrate  25 mg Oral BID  . pramipexole  0.5 mg Oral BID  . senna  2 tablet Oral BID  . sodium chloride flush  3 mL Intravenous Q12H  . tamsulosin  0.4 mg Oral QPC supper   Continuous Infusions: . sodium chloride     PRN Meds: sodium chloride, acetaminophen, albuterol, ondansetron (ZOFRAN) IV, sodium chloride flush   Vital Signs    Vitals:   07/01/18 0428 07/01/18 0637 07/01/18 0748 07/01/18 0748  BP:  (!) 137/57  139/60  Pulse:    63  Resp:    20  Temp:    98.4 F (36.9 C)  TempSrc:    Oral  SpO2:  91% 95% 96%  Weight: 282 lb 6.6 oz (128.1 kg)     Height:        Intake/Output Summary (Last 24 hours) at 07/01/2018 0831 Last data filed at 07/01/2018 0700 Gross per 24 hour  Intake 956 ml  Output 3125 ml  Net -2169 ml   Filed Weights   06/29/18 1600 07/01/18 0428  Weight: 281 lb 15.5 oz (127.9 kg) 282 lb 6.6 oz (128.1 kg)    Telemetry    NSR  -  Personally Reviewed  ECG       Physical Exam   GEN:  elderly male, NAD  Neck: No JVD Cardiac: RRR,  Respiratory: Clear to auscultation bilaterally. GI: Soft, nontender, non-distended  MS:  legs are wrapped.  Neuro:  Nonfocal  Psych: Normal affect   Labs    Chemistry Recent Labs  Lab 06/28/18 1949 06/29/18 0314 06/30/18 0337 07/01/18 0215  NA 146* 146* 144 145  K 4.7 4.3 3.8 3.8  CL 99 101 95* 94*  CO2 36* 37* 40* 41*  GLUCOSE 217* 156* 121* 147*  BUN 51* 50* 44* 38*  CREATININE 1.78* 1.61* 1.56* 1.38*  CALCIUM 9.1 8.7* 8.4* 8.9  PROT 7.7  --   --   --   ALBUMIN 3.8  --   --   --   AST 21  --   --   --   ALT 26  --   --   --   ALKPHOS 87  --   --   --   BILITOT 0.8  --   --   --  GFRNONAA 36* 41* 42* 49*  GFRAA 42* 47* 49* 57*  ANIONGAP 11 8 9 10      Hematology Recent Labs  Lab 06/28/18 1731 06/29/18 0314 07/01/18 0215  WBC 4.5  --  4.8  RBC 4.25  --  3.79*  HGB 11.6*  --  10.6*  HCT 41.3 33.8* 37.1*  MCV 97.2  --  97.9  MCH 27.3  --  28.0  MCHC 28.1*  --  28.6*  RDW 16.5*  --  15.7*  PLT 202  --  157    Cardiac Enzymes Recent Labs  Lab 06/28/18 1949 06/29/18 0127 06/29/18 0723 06/29/18 1235  TROPONINI 0.03* 0.03* 0.03* 0.03*   No results for input(s): TROPIPOC in the last 168 hours.   BNP Recent Labs  Lab 06/28/18 1952  BNP 319.2*     DDimer No results for input(s): DDIMER in the last 168 hours.   Radiology    No results found.  Cardiac Studies     Patient Profile     72 y.o. male  With chronic diastolic congestive heart failure and severe COPD.  He was admitted with hypercarbic respiratory failure associated with altered mental status.  Assessment & Plan    1.  Respiratory failure: This appears to be due to combination of acute on chronic diastolic congestive heart failure as well as severe COPD.  He is diuresed over 8 L and is feeling quite a bit better.  I discussed with Dr. Florene Glen.  He will ask the pulmonary team to  see him to see if they have any suggestions for his severe COPD.  Will transition to oral torsemide 40 mg BID.   At home he needed some additional Torsemide on M,W,F so we had him on 60 mg BID on M,W,F with 40 mg BID on other days.     Will follow him closely   Renal function has improved with diuresis      For questions or updates, please contact [place] Please consult [email or web address] for contact info under Cardiology/STEMI.      Signed, Mertie Moores, MD  07/01/2018, 8:31 AM

## 2018-07-01 NOTE — Progress Notes (Signed)
Patient is on his CPAP HS. 2L O2 bled in. Patient tolerating well at this time.

## 2018-07-02 DIAGNOSIS — G9340 Encephalopathy, unspecified: Secondary | ICD-10-CM

## 2018-07-02 LAB — BLOOD GAS, ARTERIAL
ACID-BASE EXCESS: 15.3 mmol/L — AB (ref 0.0–2.0)
BICARBONATE: 41.4 mmol/L — AB (ref 20.0–28.0)
Drawn by: 519031
O2 Content: 6 L/min
O2 Saturation: 92.4 %
PCO2 ART: 74.4 mmHg — AB (ref 32.0–48.0)
PH ART: 7.364 (ref 7.350–7.450)
Patient temperature: 98.6
pO2, Arterial: 65.2 mmHg — ABNORMAL LOW (ref 83.0–108.0)

## 2018-07-02 LAB — URINE CULTURE

## 2018-07-02 LAB — BASIC METABOLIC PANEL
Anion gap: 9 (ref 5–15)
BUN: 36 mg/dL — AB (ref 8–23)
CO2: 40 mmol/L — ABNORMAL HIGH (ref 22–32)
CREATININE: 1.48 mg/dL — AB (ref 0.61–1.24)
Calcium: 8.5 mg/dL — ABNORMAL LOW (ref 8.9–10.3)
Chloride: 93 mmol/L — ABNORMAL LOW (ref 98–111)
GFR calc non Af Amer: 45 mL/min — ABNORMAL LOW (ref >60)
GFR, EST AFRICAN AMERICAN: 52 mL/min — AB (ref >60)
Glucose, Bld: 137 mg/dL — ABNORMAL HIGH (ref 70–99)
POTASSIUM: 3.8 mmol/L (ref 3.5–5.1)
SODIUM: 142 mmol/L (ref 135–145)

## 2018-07-02 LAB — CBC
HEMATOCRIT: 34.8 % — AB (ref 39.0–52.0)
Hemoglobin: 10 g/dL — ABNORMAL LOW (ref 13.0–17.0)
MCH: 27.5 pg (ref 26.0–34.0)
MCHC: 28.7 g/dL — ABNORMAL LOW (ref 30.0–36.0)
MCV: 95.9 fL (ref 78.0–100.0)
PLATELETS: 129 10*3/uL — AB (ref 150–400)
RBC: 3.63 MIL/uL — ABNORMAL LOW (ref 4.22–5.81)
RDW: 15.3 % (ref 11.5–15.5)
WBC: 5 10*3/uL (ref 4.0–10.5)

## 2018-07-02 LAB — GLUCOSE, CAPILLARY
GLUCOSE-CAPILLARY: 177 mg/dL — AB (ref 70–99)
GLUCOSE-CAPILLARY: 134 mg/dL — AB (ref 70–99)
Glucose-Capillary: 183 mg/dL — ABNORMAL HIGH (ref 70–99)
Glucose-Capillary: 166 mg/dL — ABNORMAL HIGH (ref 70–99)

## 2018-07-02 LAB — MAGNESIUM: Magnesium: 2.3 mg/dL (ref 1.7–2.4)

## 2018-07-02 NOTE — Progress Notes (Signed)
Physical Therapy Treatment Patient Details Name: Travis [name] MRN: [id number] DOB: 1946-05-07 Today's Date: 07/02/2018    History of Present Illness Pt is a 72 y.o. M with significant PMH of type 2 diabetes mellitus, hypothyroidism, hypertension, chronic kidney disease stage III, COPD, chronic hypoxic respiratory failure, recent back surgery (4/19) who presents for evaluation of somnolence and confusion.    PT Comments    Pt agreeable to therapy today, however RN reports that he had been sitting up in chair most of the day and had just returned to bed which required maxAx3. Pt agreeable to coming EoB and performing seated exercise. Pt required modAx2 for supine to sit. Pt reports that he is planning to get OOB for dinner later this evening. D/c plan continues to be appropriate. PT will continue to follow acutely.    Follow Up Recommendations  SNF     Equipment Recommendations  None recommended by PT    Recommendations for Other Services       Precautions / Restrictions Precautions Precautions: Fall Restrictions Weight Bearing Restrictions: No    Mobility  Bed Mobility Overal bed mobility: Needs Assistance Bed Mobility: Supine to Sit     Supine to sit: Mod assist;+2 for physical assistance     General bed mobility comments: modA for bringing trunk to upright   Transfers                 General transfer comment: not attempted, RN reports that pt was up in chair all morning and was a heavy maxAx3 to return to bed, and was planning on returning to chair for dinner      Balance Overall balance assessment: Needs assistance Sitting-balance support: Bilateral upper extremity supported;Feet supported Sitting balance-Leahy Scale: Fair                                      Cognition Arousal/Alertness: Awake/alert Behavior During Therapy: WFL for tasks assessed/performed Overall Cognitive Status: Within Functional Limits for tasks assessed                                         Exercises General Exercises - Upper Extremity Shoulder ABduction: AROM;Both;5 reps;Seated Elbow Flexion: AROM;Both;5 reps;Seated Elbow Extension: AROM;Both;5 reps;Seated Wrist Flexion: AROM;Both;5 reps;Seated Wrist Extension: AROM;Both;5 reps;Seated General Exercises - Lower Extremity Long Arc Quad: 5 reps;Both;Seated;AROM Hip Flexion/Marching: AROM;Both;5 reps;Seated Toe Raises: AROM;Both;5 reps;Seated Heel Raises: AROM;Both;5 reps;Seated    General Comments General comments (skin integrity, edema, etc.): VSS       Pertinent Vitals/Pain Pain Assessment: No/denies pain           PT Goals (current goals can now be found in the care plan section) Acute Rehab PT Goals Patient Stated Goal: walk PT Goal Formulation: With patient Time For Goal Achievement: 07/14/18 Potential to Achieve Goals: Fair Progress towards PT goals: Progressing toward goals    Frequency    Min 2X/week      PT Plan Current plan remains appropriate       AM-PAC PT "6 Clicks" Daily Activity  Outcome Measure  Difficulty turning over in bed (including adjusting bedclothes, sheets and blankets)?: Unable Difficulty moving from lying on back to sitting on the side of the bed? : Unable Difficulty sitting down on and standing up from a chair with arms (e.g., wheelchair, bedside  commode, etc,.)?: Unable Help needed moving to and from a bed to chair (including a wheelchair)?: A Lot Help needed walking in hospital room?: Total Help needed climbing 3-5 steps with a railing? : Total 6 Click Score: 7    End of Session Equipment Utilized During Treatment: Gait belt Activity Tolerance: Patient tolerated treatment well Patient left: in chair;with call bell/phone within reach Nurse Communication: Mobility status PT Visit Diagnosis: Unsteadiness on feet (R26.81);Muscle weakness (generalized) (M62.81);Difficulty in walking, not elsewhere classified (R26.2)      Time: 1660-6301 PT Time Calculation (min) (ACUTE ONLY): 16 min  Charges:  $Therapeutic Exercise: 8-22 mins                    G Codes:       Deniro Laymon B. Migdalia Dk PT, DPT Acute Rehabilitation  [phone] Pager [phone]     [place] 07/02/2018, 5:07 PM

## 2018-07-02 NOTE — Progress Notes (Signed)
Occupational Therapy Treatment Patient Details Name: Travis [name] MRN: [id number] DOB: [birth year] Today's Date: 07/02/2018    History of present illness Pt is a 72 y.o. M with significant PMH of type 2 diabetes mellitus, hypothyroidism, hypertension, chronic kidney disease stage III, COPD, chronic hypoxic respiratory failure, recent back surgery (4/19) who presents for evaluation of somnolence and confusion.   OT comments  OT session focused on BUE HEP- tricep pushups from chair  Follow Up Recommendations  Supervision/Assistance - 24 hour;SNF          Precautions / Restrictions Precautions Precautions: Fall Restrictions Weight Bearing Restrictions: No       Mobility Bed Mobility               General bed mobility comments: pt sitting in recliner upon arrival  Transfers                 General transfer comment: did  not perform        ADL either performed or assessed with clinical judgement   ADL Overall ADL's : Needs assistance/impaired                                       General ADL Comments: OT session focuse on BUE Exercise- tricep pushups from recliner.  Pt performed 4 sets of 5. Educated on benefits in prep for increase I with sit to stand as well as pressure relief.     Vision Patient Visual Report: No change from baseline            Cognition Arousal/Alertness: Awake/alert Behavior During Therapy: WFL for tasks assessed/performed Overall Cognitive Status: Within Functional Limits for tasks assessed                                               Frequency  Min 2X/week        Progress Toward Goals  OT Goals(current goals can now be found in the care plan section)  Progress towards OT goals: Progressing toward goals     Plan Discharge plan remains appropriate          End of Session    OT Visit Diagnosis: Unsteadiness on feet (R26.81);Other abnormalities of gait and mobility  (R26.89);Muscle weakness (generalized) (M62.81)   Activity Tolerance Patient tolerated treatment well   Patient Left in chair;with call bell/phone within reach;with family/visitor present   Nurse Communication          Time: 1350-1415 OT Time Calculation (min): 25 min  Charges: OT General Charges $OT Visit: 1 Visit OT Treatments $Therapeutic Exercise: 23-37 mins  [place], [place]   Betsy Pries 07/02/2018, 1:26 PM

## 2018-07-02 NOTE — Progress Notes (Signed)
PROGRESS NOTE    [name]  DGU:[id number] DOB: 06-05-1946 DOA: 06/28/2018 PCP: [name], MD   Brief Narrative:  Travis [name] is Travis [name] 72 y.o. male with medical history significant for type 2 diabetes mellitus, hypothyroidism, hypertension, chronic kidney disease stage III, COPD, and chronic hypoxic respiratory failure, now presenting to the emergency department for evaluation of somnolence and confusion.  History is obtained primarily from the patient's wife and daughter at the bedside as well as EMR and discussion with ED personnel.  Patient had back surgery in April and has reportedly been more fatigued since that time, but this has progressed significantly over the past week or so, over which interval he has been sleeping most of the day and seems to be mildly confused at times.  He has been noted to wake from sleep acutely short of breath multiple times every night for the past few days.  He has had progressive bilateral lower extremity swelling and recently had diuretics increased for this reason, but with no appreciable improvement.  He was diagnosed with UTI on 06/25/2018 and started on ciprofloxacin.  His condition has not improved with treatment of this.  He denies chest pain, fevers, or chills.   Assessment & Plan:   Principal Problem:   Acute encephalopathy Active Problems:   OSA on CPAP   Hypothyroid   Elevated troponin I level   Urinary tract infection without hematuria   Stage 3 chronic kidney disease (HCC)   Restrictive lung disease   Morbid obesity (HCC)   Acute on chronic respiratory failure with hypoxia and hypercapnia (HCC)   Benign essential HTN   Acute on chronic diastolic (congestive) heart failure (HCC)   Poorly controlled type 2 diabetes mellitus with peripheral neuropathy (HCC)   Metabolic encephalopathy due to CO2 retention/hypercarbic respiratory failure - continue BIPAP nightly - c/s care management for BIPAP at home (will need to get updated  settings to order DME for SNF) - normal B12, folate, RPR, TSH.  Ammonia very mildly elevated.  Acute Hypoxic Hypercarbic Respiratory Failure: Presented with hypercarbia and now on 5 L by [facility] (on 2 L by [facility] at home) Diuresing as noted below Follows with pulm as outpatient.  Severe obstructive disease as well as severe restriction.  Will continue diuresis as noted below and consult pulmonology for his COPD Per Pulm - recommending continued diuresis, transition to bipap if eligible (they're arranging for pt to see sleep specialists).  No evidence of COPD based on PFT's, may recommend HRCT at some point. Wean O2 as tolerated  Acute on chronic diastolic CHF, history of pulmonary hypertension - Has increased peripheral edema despite recent increase in torsemide, he also has home health RN placed wrap on his legs started two weeks ago, he reports wraps are changed twice Yonathan Perrow week -cxr "BILATERAL pulmonary opacities are seen representing vascular congestion, similar aeration to priors. Bibasilar atelectasis is superimposed." -BNP 319, elevated when compared to priors -IV Lasix, continue at 60 -> being transitioned to PO torsemide 40 BID (was on 40 BID with 60 BID MWF) - Good UOP, weight up today, but not sure how reliable - Continue beta-blocker - Echocardiogram with normal EF, grade 1 diastolic dysfunction (tech diff study, see below) - LE dopplers negative for DVT - Cardiology c/s appreciate recs  Wt Readings from Last 3 Encounters:  07/02/18 129.3 kg (285 lb 0.9 oz)  05/20/18 127 kg (280 lb)  05/18/18 125.2 kg (276 lb)   Bradycardia: sinus brady on tele, decrease metoprolol  to 25 mg BID.  Continue to monitor.  Coronary artery disease, cardiac cath in 2016, nonobstructive CAD -No chest pain -Troponin mild and flat at 0.03 -EKG with chronic right bundle branch block.  PVCs, no acute ST-T changes -Continue aspirin /beta-blocker  HTN: stable on lopressor/hydralazine,  diuretics  Non-insulin-dependent type 2 diabetes -A1c 7.7% in March 2019 -Hold home oral meds glimepiride -SSI while in the hospital - will add 5 units lantus, follow AM BG improved - continue gabapentin (? Contribution to edema)  CKD 3 -Creatinine at baseline (fluctuating), renally dose medications -Currently no urinary symptoms, UA obtained in the ED unremarkable, no fever no leukocytosis  UTI: growing coag negative staph and pseudomonas here.  Both resistant to cipro.  Given lack of symptoms, suspect component of colonization, possibly contaminant with coag negative staph. Hold abx, follow  BPH on Flomax  Hypothyroidism, TSH 1.17, continue Synthroid  OSA on CPAP at night Body mass index is 37.2 kg/m.  Decubitus ulcer  LE wounds: Wound c/s, appreciate recs.  Also has LE wound (had toenail removed recently).  Frequent turns.    DVT prophylaxis: heparin Code Status: full  Family Communication: discussed with wife over phone 7/3 Disposition Plan: pending improvement in respiratory status   Consultants:   Cardiology  Procedures:  [place] Study Conclusions  - Left ventricle: The cavity size was normal. Wall thickness was   normal. Systolic function was normal. The estimated ejection   fraction was in the range of 55% to 60%. Wall motion was normal;   there were no regional wall motion abnormalities. Doppler   parameters are consistent with abnormal left ventricular   relaxation (grade 1 diastolic dysfunction).  Impressions:  - Technically difficult; definity used; normal LV systolic   function; mild diastolic dysfunction.  LE [place] Final Interpretation: Right: There is no evidence of deep vein thrombosis in the lower extremity. No cystic structure found in the popliteal fossa. Left: There is no evidence of deep vein thrombosis in the lower extremity. No cystic structure found in the popliteal fossa.   Antimicrobials: Anti-infectives (From admission, onward)    Start     Dose/Rate Route Frequency Ordered Stop   06/28/18 2200  ciprofloxacin (CIPRO) tablet 500 mg    Note to Pharmacy:  For 7 days     500 mg Oral 2 times daily 06/28/18 2146 07/02/18 2159         Subjective: Feeling ok.  Objective: Vitals:   07/02/18 0600 07/02/18 0715 07/02/18 0728 07/02/18 0805  BP: (!) 147/69   (!) 149/67  Pulse:    74  Resp:    (!) 22  Temp:    98.3 F (36.8 C)  TempSrc:    Oral  SpO2: 93% 92% 92% 90%  Weight:      Height:        Intake/Output Summary (Last 24 hours) at 07/02/2018 1034 Last data filed at 07/02/2018 0715 Gross per 24 hour  Intake 1687 ml  Output 2300 ml  Net -613 ml   Filed Weights   06/29/18 1600 07/01/18 0428 07/02/18 0414  Weight: 127.9 kg (281 lb 15.5 oz) 128.1 kg (282 lb 6.6 oz) 129.3 kg (285 lb 0.9 oz)    Examination:  General: No acute distress. Cardiovascular: Heart sounds show Dannette Kinkaid regular rate, and rhythm. Lungs: Decreased lung sounds, no increased wob Abdomen: Soft, nontender, nondistended Neurological: Alert and oriented 3. Moves all extremities 4. Cranial nerves II through XII grossly intact. Skin: decub not eval today Extremities: No  clubbing or cyanosis. Bilateral LEE, unna boots. Psychiatric: Mood and affect are normal. Insight and judgment are appropriate.      Data Reviewed: I have personally reviewed following labs and imaging studies  CBC: Recent Labs  Lab 06/28/18 1731 06/29/18 0314 07/01/18 0215 07/02/18 0327  WBC 4.5  --  4.8 5.0  NEUTROABS 3.5  --   --   --   HGB 11.6*  --  10.6* 10.0*  HCT 41.3 33.8* 37.1* 34.8*  MCV 97.2  --  97.9 95.9  PLT 202  --  157 502*   Basic Metabolic Panel: Recent Labs  Lab 06/28/18 1949 06/29/18 0314 06/30/18 0337 07/01/18 0215 07/02/18 0327  NA 146* 146* 144 145 142  K 4.7 4.3 3.8 3.8 3.8  CL 99 101 95* 94* 93*  CO2 36* 37* 40* 41* 40*  GLUCOSE 217* 156* 121* 147* 137*  BUN 51* 50* 44* 38* 36*  CREATININE 1.78* 1.61* 1.56* 1.38* 1.48*   CALCIUM 9.1 8.7* 8.4* 8.9 8.5*  MG  --   --   --  2.4 2.3   GFR: Estimated Creatinine Clearance: 62.7 mL/min (Elridge Stemm) (by C-G formula based on SCr of 1.48 mg/dL (H)). Liver Function Tests: Recent Labs  Lab 06/28/18 1949  AST 21  ALT 26  ALKPHOS 87  BILITOT 0.8  PROT 7.7  ALBUMIN 3.8   No results for input(s): LIPASE, AMYLASE in the last 168 hours. Recent Labs  Lab 06/28/18 1744 06/29/18 0314  AMMONIA 29 36*   Coagulation Profile: No results for input(s): INR, PROTIME in the last 168 hours. Cardiac Enzymes: Recent Labs  Lab 06/28/18 1949 06/29/18 0127 06/29/18 0723 06/29/18 1235  TROPONINI 0.03* 0.03* 0.03* 0.03*   BNP (last 3 results) No results for input(s): PROBNP in the last 8760 hours. HbA1C: No results for input(s): HGBA1C in the last 72 hours. CBG: Recent Labs  Lab 07/01/18 1123 07/01/18 1303 07/01/18 1643 07/01/18 2137 07/02/18 0803  GLUCAP 189* 196* 220* 160* 177*   Lipid Profile: No results for input(s): CHOL, HDL, LDLCALC, TRIG, CHOLHDL, LDLDIRECT in the last 72 hours. Thyroid Function Tests: No results for input(s): TSH, T4TOTAL, FREET4, T3FREE, THYROIDAB in the last 72 hours. Anemia Panel: No results for input(s): VITAMINB12, FOLATE, FERRITIN, TIBC, IRON, RETICCTPCT in the last 72 hours. Sepsis Labs: Recent Labs  Lab 06/28/18 1744  LATICACIDVEN 0.9    Recent Results (from the past 240 hour(s))  MRSA PCR Screening     Status: None   Collection Time: 06/29/18  6:25 PM  Result Value Ref Range Status   MRSA by PCR NEGATIVE NEGATIVE Final    Comment:        The GeneXpert MRSA Assay (FDA approved for NASAL specimens only), is one component of Yarelis Ambrosino comprehensive MRSA colonization surveillance program. It is not intended to diagnose MRSA infection nor to guide or monitor treatment for MRSA infections. Performed at [facility], [place] [address], [place], [place] [address]   Urine Culture     Status: Abnormal   Collection Time: 06/30/18   2:00 PM  Result Value Ref Range Status   Specimen Description URINE, RANDOM  Final   Special Requests NONE  Final   Culture (Devell Parkerson)  Final    >=100,000 COLONIES/mL PSEUDOMONAS AERUGINOSA 40,000 COLONIES/mL STAPHYLOCOCCUS SPECIES (COAGULASE NEGATIVE)    Report Status 07/02/2018 FINAL  Final   Organism ID, Bacteria PSEUDOMONAS AERUGINOSA (Chonda Baney)  Final   Organism ID, Bacteria STAPHYLOCOCCUS SPECIES (COAGULASE NEGATIVE) (Carmon Sahli)  Final  Susceptibility   Pseudomonas aeruginosa - MIC*    CEFTAZIDIME 4 SENSITIVE Sensitive     CIPROFLOXACIN >=4 RESISTANT Resistant     GENTAMICIN RESISTANT Resistant     IMIPENEM 1 SENSITIVE Sensitive     PIP/TAZO 32 SENSITIVE Sensitive     CEFEPIME 4 SENSITIVE Sensitive     * >=100,000 COLONIES/mL PSEUDOMONAS AERUGINOSA   Staphylococcus species (coagulase negative) - MIC*    CIPROFLOXACIN >=8 RESISTANT Resistant     GENTAMICIN <=0.5 SENSITIVE Sensitive     NITROFURANTOIN <=16 SENSITIVE Sensitive     OXACILLIN >=4 RESISTANT Resistant     TETRACYCLINE <=1 SENSITIVE Sensitive     VANCOMYCIN 1 SENSITIVE Sensitive     TRIMETH/SULFA <=10 SENSITIVE Sensitive     CLINDAMYCIN RESISTANT Resistant     RIFAMPIN <=0.5 SENSITIVE Sensitive     Inducible Clindamycin POSITIVE Resistant     * 40,000 COLONIES/mL STAPHYLOCOCCUS SPECIES (COAGULASE NEGATIVE)         Radiology Studies: No results found.      Scheduled Meds: . aspirin EC  81 mg Oral Daily  . ciprofloxacin  500 mg Oral BID  . escitalopram  10 mg Oral Daily  . fesoterodine  8 mg Oral Daily  . gabapentin  400 mg Oral TID  . Gerhardt's butt cream   Topical BID  . heparin  5,000 Units Subcutaneous Q8H  . hydrALAZINE  50 mg Oral Q8H  . insulin aspart  0-5 Units Subcutaneous QHS  . insulin aspart  0-9 Units Subcutaneous TID WC  . insulin glargine  5 Units Subcutaneous QHS  . levothyroxine  175 mcg Oral QAC breakfast  . mouth rinse  15 mL Mouth Rinse BID  . metoprolol tartrate  25 mg Oral BID  .  pramipexole  0.5 mg Oral BID  . senna  2 tablet Oral BID  . sodium chloride flush  3 mL Intravenous Q12H  . tamsulosin  0.4 mg Oral QPC supper  . torsemide  40 mg Oral BID   Continuous Infusions: . sodium chloride       LOS: 4 days    Time spent: over 30 min    Fayrene Helper, MD [facility] Pager [phone]  If 7PM-7AM, please contact night-coverage [email or web address] Password [email or web address] 07/02/2018, 10:34 AM

## 2018-07-02 NOTE — Progress Notes (Signed)
Patient placed on Dreamstation bipap mode IPAP: 20 EPAP: 6 with 5L O2 bled in, patient tolerating well at this time, RCP will continue to follow.

## 2018-07-02 NOTE — Progress Notes (Addendum)
PULMONARY / CRITICAL CARE MEDICINE   Name: Travis [name] MRN: [id number] DOB: 1946-05-04    ADMISSION DATE:  06/28/2018 CONSULTATION DATE:  07/01/18  REFERRING MD:  Acute respiratory failure/ hypoxemic and hypercarbic   CHIEF COMPLAINT:  sob  BRIEF SUMMARY:   40 yowm remote smoker eval by Mannam in pulmonary clinic initially 10/30/17 with dx of restrictive lung defect with minimal concavity to f/v loop c/w small airways dz but not dx of copd, rec trial of stiolto which did not appear to help, and downhill x 2 months with worsening sob/ leg swelling with confusion / hypersomnolence documented on admit but pt denies cough /wheeze and admitted with acute resp failure 06/28/18 and better on bipap, transitioned to cpap and 24/7 02 but still needing 5lpm so pccm service consulted am 07/01/18.  SUBJECTIVE:  Wife reports concern regarding mental status > sleepier today than yesterday.  O2 increased yesterday to 8L.  Currently on 5L with sats running 88-96%.    VITAL SIGNS: BP (!) 149/67 (BP Location: Right Arm)   Pulse 74   Temp 98.3 F (36.8 C) (Oral)   Resp (!) 22   Ht 6\' 1"  (1.854 m)   Wt 285 lb 0.9 oz (129.3 kg)   SpO2 90%   BMI 37.61 kg/m      Wt Readings from Last 3 Encounters:  07/01/18 282 lb 6.6 oz (128.1 kg)  05/20/18 280 lb (127 kg)  05/18/18 276 lb (125.2 kg)     Intake/Output Summary (Last 24 hours) at 07/02/2018 1146 Last data filed at 07/02/2018 0715 Gross per 24 hour  Intake 1687 ml  Output 2300 ml  Net -613 ml    INTAKE / OUTPUT: I/O last 3 completed shifts: In: 1927 [P.O.:1924; I.V.:3] Out: 4125 [Urine:4125]  PHYSICAL EXAMINATION: General: elderly male in NAD sitting up in chair HEENT: MM pink/moist, tolerating PO's Neuro: drowsy, AAOx4, speech clear, MAE CV: s1s2 rrr, no m/r/g PULM: even/non-labored, clear anterior, crackles at left base OZ:HYQM, non-tender, bsx4 active  Extremities: warm/dry, compression wraps on BLE's, 1-2+pitting edema  Skin: no rashes or  lesions  LABS:  BMET Recent Labs  Lab 06/30/18 0337 07/01/18 0215 07/02/18 0327  NA 144 145 142  K 3.8 3.8 3.8  CL 95* 94* 93*  CO2 40* 41* 40*  BUN 44* 38* 36*  CREATININE 1.56* 1.38* 1.48*  GLUCOSE 121* 147* 137*    Electrolytes Recent Labs  Lab 06/30/18 0337 07/01/18 0215 07/02/18 0327  CALCIUM 8.4* 8.9 8.5*  MG  --  2.4 2.3    CBC Recent Labs  Lab 06/28/18 1731 06/29/18 0314 07/01/18 0215 07/02/18 0327  WBC 4.5  --  4.8 5.0  HGB 11.6*  --  10.6* 10.0*  HCT 41.3 33.8* 37.1* 34.8*  PLT 202  --  157 129*    Coag's No results for input(s): APTT, INR in the last 168 hours.  Sepsis Markers Recent Labs  Lab 06/28/18 1744  LATICACIDVEN 0.9    ABG Recent Labs  Lab 06/28/18 2146 06/29/18 0129 06/29/18 0448  PHART 7.310* 7.348* 7.400  PCO2ART 78.0* 72.0* 68.1*  PO2ART 85.0 66.0* 71.0*    Liver Enzymes Recent Labs  Lab 06/28/18 1949  AST 21  ALT 26  ALKPHOS 87  BILITOT 0.8  ALBUMIN 3.8    Cardiac Enzymes Recent Labs  Lab 06/29/18 0127 06/29/18 0723 06/29/18 1235  TROPONINI 0.03* 0.03* 0.03*    Glucose Recent Labs  Lab 07/01/18 0748 07/01/18 1123 07/01/18 1303 07/01/18 1643 07/01/18  2137 07/02/18 0803  GLUCAP 227* 189* 196* 220* 160* 177*    Imaging No results found.   Studies: CT abd w/o contrast 06/28/18 : Dep atx bases only [place] 06/29/18: Left ventricle: The cavity size was normal. Wall thickness was normal. Systolic function was normal. The estimated ejection fraction was in the range of 55% to 60%. Wall motion was normal; there were no regional wall motion abnormalities. Doppler  parameters are consistent with abnormal left ventricular relaxation (grade 1 diastolic dysfunction).  DISCUSSION: Pt with MO complicated by diastolic dysfunction, OHS / restrictive physiology admitted  with acute hypoxemic/hypercabic resp failure decompensated likely due to fluid retention.  Will need transition from cpap to bipap sooner rather  than later althought qualifying without a recent sleep study will be difficult.     ASSESSMENT / PLAN:   1)  Acute on chronic resp failure hypoxemic and hypercarbic - normal TSH this admit.  Baseline 2.3-3L O2 dependent + CPAP QHS - lasix for negative to even balance  - incentive spirometry  - O2 saturation goals 88-94%   2) OSA/ probable now OHS  - transition to BiPAP at discharge (if able), autotitration device, heated, humidity, mask of choice.  If not able to obtain device due to insurance issues, would continue CPAP at minimum QHS and PRN daytime sleep  - arranged for outpatient sleep evaluation, see discharge section    3) No evidence of COPD - no role for bronchodilators    PCCM will be available PRN.  Please call back if new needs arise.   Noe Gens, NP-C [facility] Pgr: [phone] or if no answer [phone] 07/02/2018, 11:46 AM

## 2018-07-02 NOTE — Progress Notes (Signed)
Pt has received [employer] Medicare auth for transition to SNF- auth good through Sunday if pt is ready over the wknd # [id number]  Bipap order received and sent to SNF for them to order  CSW will continue to follow and assist with transition to SNF when stable  Jorge Ny, [place] Social Worker [phone]

## 2018-07-02 NOTE — Progress Notes (Signed)
Patient with SOB O2 saturation noted 78% on 3L [place]. Increased oxygen to 5L [place]. Takes a long time to recover. Current O2 sat 88%. Patient is sitting up in bedside chair at this time. Wife is present at the bedside. Other VS stable.

## 2018-07-02 NOTE — Progress Notes (Addendum)
Progress Note  Patient Name: Travis [name] Date of Encounter: 07/02/2018  Primary Cardiologist: Mertie Moores, MD   Subjective   Reports improvement in his breathing since yesterday. LE edema also significantly improved since admission. Denies palpitations.   Inpatient Medications    Scheduled Meds: . aspirin EC  81 mg Oral Daily  . ciprofloxacin  500 mg Oral BID  . escitalopram  10 mg Oral Daily  . fesoterodine  8 mg Oral Daily  . gabapentin  400 mg Oral TID  . Gerhardt's butt cream   Topical BID  . heparin  5,000 Units Subcutaneous Q8H  . hydrALAZINE  50 mg Oral Q8H  . insulin aspart  0-5 Units Subcutaneous QHS  . insulin aspart  0-9 Units Subcutaneous TID WC  . insulin glargine  5 Units Subcutaneous QHS  . levothyroxine  175 mcg Oral QAC breakfast  . mouth rinse  15 mL Mouth Rinse BID  . metoprolol tartrate  25 mg Oral BID  . pramipexole  0.5 mg Oral BID  . senna  2 tablet Oral BID  . sodium chloride flush  3 mL Intravenous Q12H  . tamsulosin  0.4 mg Oral QPC supper  . torsemide  40 mg Oral BID   Continuous Infusions: . sodium chloride     PRN Meds: sodium chloride, acetaminophen, albuterol, ondansetron (ZOFRAN) IV, sodium chloride flush   Vital Signs    Vitals:   07/02/18 0600 07/02/18 0715 07/02/18 0728 07/02/18 0805  BP: (!) 147/69   (!) 149/67  Pulse:    74  Resp:    (!) 22  Temp:    98.3 F (36.8 C)  TempSrc:    Oral  SpO2: 93% 92% 92% 90%  Weight:      Height:        Intake/Output Summary (Last 24 hours) at 07/02/2018 1124 Last data filed at 07/02/2018 0715 Gross per 24 hour  Intake 1687 ml  Output 2300 ml  Net -613 ml   Filed Weights   06/29/18 1600 07/01/18 0428 07/02/18 0414  Weight: 127.9 kg (281 lb 15.5 oz) 128.1 kg (282 lb 6.6 oz) 129.3 kg (285 lb 0.9 oz)    Telemetry    Sinus rhythm with occasional PVCs - Personally Reviewed   Physical Exam   GEN: Sitting upright in bed in no acute distress.   Neck: No JVD, no carotid  bruits Cardiac: RRR, no murmurs, rubs, or gallops.  Respiratory: Clear to auscultation bilaterally, no wheezes/ rales/ rhonchi GI: NABS, Soft, obese, nontender, non-distended  MS: Trace to 1+ LE edema; No deformity. Neuro:  Nonfocal, moving all extremities spontaneously Psych: Normal affect   Labs    Chemistry Recent Labs  Lab 06/28/18 1949  06/30/18 0337 07/01/18 0215 07/02/18 0327  NA 146*   < > 144 145 142  K 4.7   < > 3.8 3.8 3.8  CL 99   < > 95* 94* 93*  CO2 36*   < > 40* 41* 40*  GLUCOSE 217*   < > 121* 147* 137*  BUN 51*   < > 44* 38* 36*  CREATININE 1.78*   < > 1.56* 1.38* 1.48*  CALCIUM 9.1   < > 8.4* 8.9 8.5*  PROT 7.7  --   --   --   --   ALBUMIN 3.8  --   --   --   --   AST 21  --   --   --   --  ALT 26  --   --   --   --   ALKPHOS 87  --   --   --   --   BILITOT 0.8  --   --   --   --   GFRNONAA 36*   < > 42* 49* 45*  GFRAA 42*   < > 49* 57* 52*  ANIONGAP 11   < > 9 10 9    < > = values in this interval not displayed.     Hematology Recent Labs  Lab 06/28/18 1731 06/29/18 0314 07/01/18 0215 07/02/18 0327  WBC 4.5  --  4.8 5.0  RBC 4.25  --  3.79* 3.63*  HGB 11.6*  --  10.6* 10.0*  HCT 41.3 33.8* 37.1* 34.8*  MCV 97.2  --  97.9 95.9  MCH 27.3  --  28.0 27.5  MCHC 28.1*  --  28.6* 28.7*  RDW 16.5*  --  15.7* 15.3  PLT 202  --  157 129*    Cardiac Enzymes Recent Labs  Lab 06/28/18 1949 06/29/18 0127 06/29/18 0723 06/29/18 1235  TROPONINI 0.03* 0.03* 0.03* 0.03*   No results for input(s): TROPIPOC in the last 168 hours.   BNP Recent Labs  Lab 06/28/18 1952  BNP 319.2*     DDimer No results for input(s): DDIMER in the last 168 hours.   Radiology    No results found.  Cardiac Studies   Echocardiogram 06/29/18: Study Conclusions  - Left ventricle: The cavity size was normal. Wall thickness was normal. Systolic function was normal. The estimated ejection fraction was in the range of 55% to 60%. Wall motion was  normal; there were no regional wall motion abnormalities. Doppler parameters are consistent with abnormal left ventricular relaxation (grade 1 diastolic dysfunction).  Impressions:  - Technically difficult; definity used; normal LV systolic function; mild diastolic dysfunction.  ECHO:02/2017 - Left ventricle: The cavity size was normal. Wall thickness was increased in a pattern of mild LVH. Systolic function was vigorous. The estimated ejection fraction was in the range of 65% to 70%. Wall motion was normal; there were no regional wall motion abnormalities. Doppler parameters are consistent with pseudonormal left ventricular relaxation (grade 2 diastolic dysfunction). The E/A ratio is >1.5. The E/e&' ratio is >15, suggesting elevated LV filling pressure. - Left atrium: The atrium was mildly dilated. - Inferior vena cava: The vessel was normal in size. The respirophasic diameter changes were in the normal range (>= 50%), consistent with normal central venous pressure. Impressions: - Compared to a prior study in 2016, there is no significant change. LV filling pressure is elevated.  CATH: Cardiac catheterization 09/07/15:  Prox RCA to Mid RCA lesion, 40% stenosed.  Ost Cx to Prox Cx lesion, 50% stenosed.  LM lesion, 40% stenosed.  The left ventricular systolic function is normal.  Dist LAD lesion, 40% stenosed.  1. Nonobstructive CAD 2. Normal LV function. 3. Mild pulmonary HTN with normal LV filling pressures.   Plan: medical therapy.  Patient Profile     72 y.o. male with a hx of chronic diastolic heart failure, hypertension, hypothyroidism, diabetes mellitus, obstructive sleep apnea, mild pulmonary hypertension and severe lumbar and thoracic spinal stenosis (s/p decompressive laminectomy) who is being followed by cardiology for the evaluation of acute on chronic diastolic heart failure.  Assessment & Plan    1. Acute on chronic  respiratory failure: likely multifactorial in the setting of acute on chronic diastolic CHF and OSH. He was diuresed with IV  lasix and transitioned to po torsemide yesterday. He has had good UOP with net -855mL in the last 24 hours with net -9.4L this admission. ? Weight accuracy given trend: 281lbs 7/2, 282lbs 7/4, and 285lbs today. Pulmonary was consulted yesterday and reported no evidence of COPD and thought symptoms were secondary to above and recommended possible addition of spironolactone. Dr. Loletha Grayer discussed the importance of daily weights as being the first indication of fluid build up. Wife reports inability to weigh him at home given weakness and balance issues limiting ability to stand for weights. Lungs are clear today and LE edema appears stable.  - Continue po torsemide 40mg  BID for now - can resume home regimen at discharge: 60mg  BID on M/W/F and 40mg  BID on other days.  - Continue metoprolol - Primary team to look into options for measuring his weights at home - programs or devices that will allow for daily weights.   2. HTN: BP intermittently elevated but stable  - Continue current regimen  3. OSA/OHS: Pulmonary evaluated yesterday; recommended evaluation by sleep docoutpatient for consideration of progression to Bipap qHS.  - Continue management per pulmonary team    For questions or updates, please contact [facility] Please consult [email or web address] for contact info under Cardiology/STEMI.      Signed, Abigail Butts, [name]  07/02/2018, 11:24 AM   [phone]  I have seen and examined the patient along with Abigail Butts, [name].  I have reviewed the chart, notes and new data.  I agree with PA/NP's note.  Key new complaints: Was somnolent earlier, possibly due to hypercapnia from excessive oxygen supplementation.  Appears to be alert and oriented now. Key examination changes: Residual 2+ symmetrical pitting edema of the calves bilaterally, hard to appreciate his JVD, no wet  rales heard. Key new findings / data: Reviewed the pulmonary evaluation.  While PFTs may indeed not indicate COPD but restrictive lung disease, hypercapnic respiratory failure is due to his pulmonary abnormalities (obesity hypoventilation syndrome, sleep apnea) not left heart failure.  Note chronic metabolic alkalosis as compensation for chronic respiratory acidosis.  PLAN: Switched to oral diuretics. Plan is for him to go to inpatient rehab.  At this time he is too weak to stand up without assistance. We discussed the fact that being able to monitor his weight as a marker of fluid on a daily basis will be critical to prevent repeat hospitalization.  This point we do not have a good solution for this.  We need to engage his insurance company and see if there is some type of equipment or home health follow-up that we can employ to obtain reliable fluid monitoring. Avoid excessive oxygen supplementation. Mandatory meticulous compliance with CPAP/BiPAP.   Sanda Klein, MD, [place] [phone] 07/02/2018, 12:52 PM

## 2018-07-03 LAB — BLOOD GAS, VENOUS
ACID-BASE EXCESS: 15.7 mmol/L — AB (ref 0.0–2.0)
Bicarbonate: 42 mmol/L — ABNORMAL HIGH (ref 20.0–28.0)
O2 SAT: 86.8 %
PCO2 VEN: 77.9 mmHg — AB (ref 44.0–60.0)
Patient temperature: 98.6
pH, Ven: 7.352 (ref 7.250–7.430)
pO2, Ven: 56.1 mmHg — ABNORMAL HIGH (ref 32.0–45.0)

## 2018-07-03 LAB — BASIC METABOLIC PANEL
Anion gap: 7 (ref 5–15)
BUN: 36 mg/dL — AB (ref 8–23)
CO2: 40 mmol/L — ABNORMAL HIGH (ref 22–32)
Calcium: 8.7 mg/dL — ABNORMAL LOW (ref 8.9–10.3)
Chloride: 94 mmol/L — ABNORMAL LOW (ref 98–111)
Creatinine, Ser: 1.57 mg/dL — ABNORMAL HIGH (ref 0.61–1.24)
GFR calc Af Amer: 49 mL/min — ABNORMAL LOW (ref >60)
GFR calc non Af Amer: 42 mL/min — ABNORMAL LOW (ref >60)
Glucose, Bld: 158 mg/dL — ABNORMAL HIGH (ref 70–99)
POTASSIUM: 3.7 mmol/L (ref 3.5–5.1)
Sodium: 141 mmol/L (ref 135–145)

## 2018-07-03 LAB — CBC
HEMATOCRIT: 33.8 % — AB (ref 39.0–52.0)
Hemoglobin: 10 g/dL — ABNORMAL LOW (ref 13.0–17.0)
MCH: 27.9 pg (ref 26.0–34.0)
MCHC: 29.6 g/dL — AB (ref 30.0–36.0)
MCV: 94.4 fL (ref 78.0–100.0)
Platelets: 149 10*3/uL — ABNORMAL LOW (ref 150–400)
RBC: 3.58 MIL/uL — ABNORMAL LOW (ref 4.22–5.81)
RDW: 15.7 % — AB (ref 11.5–15.5)
WBC: 4.9 10*3/uL (ref 4.0–10.5)

## 2018-07-03 LAB — GLUCOSE, CAPILLARY
Glucose-Capillary: 135 mg/dL — ABNORMAL HIGH (ref 70–99)
Glucose-Capillary: 184 mg/dL — ABNORMAL HIGH (ref 70–99)
Glucose-Capillary: 185 mg/dL — ABNORMAL HIGH (ref 70–99)
Glucose-Capillary: 119 mg/dL — ABNORMAL HIGH (ref 70–99)

## 2018-07-03 LAB — MAGNESIUM: Magnesium: 2.2 mg/dL (ref 1.7–2.4)

## 2018-07-03 NOTE — Progress Notes (Addendum)
Patient only tolerated bi pap for four hours last night, had a difficult time keeping bi pap on. Says he is claustrophobic and cannot sleep with the noise and pressure of the mask and kept taking mask off.  RT worked closely with patient to adjust mask size and pressure settings.  Patient asked to be taken off bi papand placed back on HFNC at 5L oxygen saturation is at 92%. RN educated patient on importance of keeping bi pap on as long as possible during the night Patient agreeable to try again tomorrow night.

## 2018-07-03 NOTE — Progress Notes (Signed)
Pt placed on BIPAP via dreamstation for the night- Tolerating well.

## 2018-07-03 NOTE — Progress Notes (Addendum)
PROGRESS NOTE    Travis [name]  RXV:[id number] DOB: 1946-08-08 DOA: 06/28/2018 PCP: [name], MD   Brief Narrative:  Travis [name] is Travis [name] 72 y.o. male with medical history significant for type 2 diabetes mellitus, hypothyroidism, hypertension, chronic kidney disease stage III, COPD, and chronic hypoxic respiratory failure, now presenting to the emergency department for evaluation of somnolence and confusion.  History is obtained primarily from the patient's wife and daughter at the bedside as well as EMR and discussion with ED personnel.  Patient had back surgery in April and has reportedly been more fatigued since that time, but this has progressed significantly over the past week or so, over which interval he has been sleeping most of the day and seems to be mildly confused at times.  He has been noted to wake from sleep acutely short of breath multiple times every night for the past few days.  He has had progressive bilateral lower extremity swelling and recently had diuretics increased for this reason, but with no appreciable improvement.  He was diagnosed with UTI on 06/25/2018 and started on ciprofloxacin.  His condition has not improved with treatment of this.  He denies chest pain, fevers, or chills.   Assessment & Plan:   Principal Problem:   Acute encephalopathy Active Problems:   OSA on CPAP   Hypothyroid   Elevated troponin I level   Urinary tract infection without hematuria   Stage 3 chronic kidney disease (HCC)   Restrictive lung disease   Morbid obesity (HCC)   Acute on chronic respiratory failure with hypoxia and hypercapnia (HCC)   Benign essential HTN   Acute on chronic diastolic (congestive) heart failure (HCC)   Poorly controlled type 2 diabetes mellitus with peripheral neuropathy (HCC)   Metabolic encephalopathy due to CO2 retention/hypercarbic respiratory failure - continue BIPAP nightly - c/s care management for BIPAP at home (will need to get updated  settings to order DME for SNF) - normal B12, folate, RPR, TSH.  Ammonia very mildly elevated. - Overall improved, some mild confusion today, but Travis [name]&Ox3 (blood gases with hypercarbia, but compensated)  Acute Hypoxic Hypercarbic Respiratory Failure: Presented with hypercarbia and now on 5 L by HFNC (on 2 L by [place] at home) Diuresing as noted below Follows with pulm as outpatient.  Severe obstructive disease as well as severe restriction.  Will continue diuresis as noted below and consult pulmonology for his COPD Per Pulm - recommending continued diuresis, transition to bipap if eligible (they're arranging for pt to see sleep specialists).  No evidence of COPD based on PFT's, may recommend HRCT at some point. O2 goal sats 88-94%, currently on 5-6 L by [place] Some trouble with bipap overnight, discussed with RT today (unclear what change was as he'd previously been tolerating this well).  Important that he's compliant with bipap nightly.  Acute on chronic diastolic CHF, history of pulmonary hypertension - Has increased peripheral edema despite recent increase in torsemide, he also has home health RN placed wrap on his legs started two weeks ago, he reports wraps are changed twice Travis [name] week -cxr "BILATERAL pulmonary opacities are seen representing vascular congestion, similar aeration to priors. Bibasilar atelectasis is superimposed." -BNP 319, elevated when compared to priors -IV Lasix, continue at 60 -> being transitioned to PO torsemide 40 BID (was on 40 BID with 60 BID MWF) - Good UOP, ?weights - Continue beta-blocker - Echocardiogram with normal EF, grade 1 diastolic dysfunction (tech diff study, see below) - LE dopplers negative for  DVT - Cardiology c/s appreciate recs - will discuss with care management programs/devices allowing for daily weights  Wt Readings from Last 3 Encounters:  07/03/18 128.2 kg (282 lb 10.1 oz)  05/20/18 127 kg (280 lb)  05/18/18 125.2 kg (276 lb)   Bradycardia: sinus  brady on tele, decrease metoprolol to 25 mg BID.  Continue to monitor.  Coronary artery disease, cardiac cath in 2016, nonobstructive CAD -No chest pain -Troponin mild and flat at 0.03 -EKG with chronic right bundle branch block.  PVCs, no acute ST-T changes -Continue aspirin /beta-blocker  HTN: stable on lopressor/hydralazine, diuretics  Non-insulin-dependent type 2 diabetes -A1c 7.7% in March 2019 -Hold home oral meds glimepiride -SSI while in the hospital - will add 5 units lantus, follow AM BG improved - continue gabapentin (? Contribution to edema)  CKD 3 -Creatinine at baseline (fluctuating), renally dose medications -Currently no urinary symptoms, UA obtained in the ED unremarkable, no fever no leukocytosis  Metabolic Alkalosis:  2/2 resp acidosis  UTI: growing coag negative staph and pseudomonas here.  Both resistant to cipro.  Given lack of symptoms, suspect component of colonization, possibly contaminant with coag negative staph. Hold abx, follow  BPH on Flomax  Hypothyroidism, TSH 1.17, continue Synthroid  OSA on CPAP at night Body mass index is 37.2 kg/m.  Decubitus ulcer  LE wounds: Wound c/s, appreciate recs.  Also has LE wound (had toenail removed recently).  Frequent turns.    DVT prophylaxis: heparin Code Status: full  Family Communication: discussed withdaughter 7/6 over phone Disposition Plan: pending improvement in respiratory status   Consultants:   Cardiology  Procedures:  Echo Study Conclusions  - Left ventricle: The cavity size was normal. Wall thickness was   normal. Systolic function was normal. The estimated ejection   fraction was in the range of 55% to 60%. Wall motion was normal;   there were no regional wall motion abnormalities. Doppler   parameters are consistent with abnormal left ventricular   relaxation (grade 1 diastolic dysfunction).  Impressions:  - Technically difficult; definity used; normal LV systolic    function; mild diastolic dysfunction.  LE [place] Final Interpretation: Right: There is no evidence of deep vein thrombosis in the lower extremity. No cystic structure found in the popliteal fossa. Left: There is no evidence of deep vein thrombosis in the lower extremity. No cystic structure found in the popliteal fossa.   Antimicrobials: Anti-infectives (From admission, onward)   Start     Dose/Rate Route Frequency Ordered Stop   06/28/18 2200  ciprofloxacin (CIPRO) tablet 500 mg    Note to Pharmacy:  For 7 days     500 mg Oral 2 times daily 06/28/18 2146 07/02/18 2159         Subjective: Feels well.   Objective: Vitals:   07/03/18 0424 07/03/18 0603 07/03/18 0744 07/03/18 1142  BP: 135/63  (!) 145/60 (!) 132/57  Pulse: 62     Resp: (!) 22     Temp: 98.8 F (37.1 C)  98.3 F (36.8 C) 98 F (36.7 C)  TempSrc: Oral  Oral Oral  SpO2: 93%   95%  Weight:  128.2 kg (282 lb 10.1 oz)    Height:        Intake/Output Summary (Last 24 hours) at 07/03/2018 1458 Last data filed at 07/03/2018 0600 Gross per 24 hour  Intake 720 ml  Output 2350 ml  Net -1630 ml   Filed Weights   07/01/18 0428 07/02/18 0414 07/03/18 7408  Weight: 128.1 kg (282 lb 6.6 oz) 129.3 kg (285 lb 0.9 oz) 128.2 kg (282 lb 10.1 oz)    Examination:  General: No acute distress. Cardiovascular: Heart sounds show Jameison Haji regular rate, and rhythm. No gallops or rubs. No murmurs. No JVD. Lungs: Clear to auscultation bilaterally with good air movement.  Abdomen: Soft, nontender, nondistended with normal active bowel sounds.  Neurological: Alert and oriented 3, mild confusion. Moves all extremities 4. Cranial nerves II through XII grossly intact. Skin: decub not evaluated today. Extremities: Unna boots  Psychiatric: Mood and affect are normal. Insight and judgment are appropriate.   Data Reviewed: I have personally reviewed following labs and imaging studies  CBC: Recent Labs  Lab 06/28/18 1731 06/29/18 0314  07/01/18 0215 07/02/18 0327 07/03/18 0254  WBC 4.5  --  4.8 5.0 4.9  NEUTROABS 3.5  --   --   --   --   HGB 11.6*  --  10.6* 10.0* 10.0*  HCT 41.3 33.8* 37.1* 34.8* 33.8*  MCV 97.2  --  97.9 95.9 94.4  PLT 202  --  157 129* 751*   Basic Metabolic Panel: Recent Labs  Lab 06/29/18 0314 06/30/18 0337 07/01/18 0215 07/02/18 0327 07/03/18 0254  NA 146* 144 145 142 141  K 4.3 3.8 3.8 3.8 3.7  CL 101 95* 94* 93* 94*  CO2 37* 40* 41* 40* 40*  GLUCOSE 156* 121* 147* 137* 158*  BUN 50* 44* 38* 36* 36*  CREATININE 1.61* 1.56* 1.38* 1.48* 1.57*  CALCIUM 8.7* 8.4* 8.9 8.5* 8.7*  MG  --   --  2.4 2.3 2.2   GFR: Estimated Creatinine Clearance: 58.8 mL/min (Tehani Mersman) (by C-G formula based on SCr of 1.57 mg/dL (H)). Liver Function Tests: Recent Labs  Lab 06/28/18 1949  AST 21  ALT 26  ALKPHOS 87  BILITOT 0.8  PROT 7.7  ALBUMIN 3.8   No results for input(s): LIPASE, AMYLASE in the last 168 hours. Recent Labs  Lab 06/28/18 1744 06/29/18 0314  AMMONIA 29 36*   Coagulation Profile: No results for input(s): INR, PROTIME in the last 168 hours. Cardiac Enzymes: Recent Labs  Lab 06/28/18 1949 06/29/18 0127 06/29/18 0723 06/29/18 1235  TROPONINI 0.03* 0.03* 0.03* 0.03*   BNP (last 3 results) No results for input(s): PROBNP in the last 8760 hours. HbA1C: No results for input(s): HGBA1C in the last 72 hours. CBG: Recent Labs  Lab 07/02/18 1201 07/02/18 1712 07/02/18 2136 07/03/18 0743 07/03/18 1140  GLUCAP 134* 183* 166* 135* 184*   Lipid Profile: No results for input(s): CHOL, HDL, LDLCALC, TRIG, CHOLHDL, LDLDIRECT in the last 72 hours. Thyroid Function Tests: No results for input(s): TSH, T4TOTAL, FREET4, T3FREE, THYROIDAB in the last 72 hours. Anemia Panel: No results for input(s): VITAMINB12, FOLATE, FERRITIN, TIBC, IRON, RETICCTPCT in the last 72 hours. Sepsis Labs: Recent Labs  Lab 06/28/18 1744  LATICACIDVEN 0.9    Recent Results (from the past 240 hour(s))    MRSA PCR Screening     Status: None   Collection Time: 06/29/18  6:25 PM  Result Value Ref Range Status   MRSA by PCR NEGATIVE NEGATIVE Final    Comment:        The GeneXpert MRSA Assay (FDA approved for NASAL specimens only), is one component of Anis Degidio comprehensive MRSA colonization surveillance program. It is not intended to diagnose MRSA infection nor to guide or monitor treatment for MRSA infections. Performed at [facility], [place] [address], [place], [place]  [address]   Urine Culture     Status: Abnormal   Collection Time: 06/30/18  2:00 PM  Result Value Ref Range Status   Specimen Description URINE, RANDOM  Final   Special Requests NONE  Final   Culture (Jaimie Pippins)  Final    >=100,000 COLONIES/mL PSEUDOMONAS AERUGINOSA 40,000 COLONIES/mL STAPHYLOCOCCUS SPECIES (COAGULASE NEGATIVE)    Report Status 07/02/2018 FINAL  Final   Organism ID, Bacteria PSEUDOMONAS AERUGINOSA (Zlata Alcaide)  Final   Organism ID, Bacteria STAPHYLOCOCCUS SPECIES (COAGULASE NEGATIVE) (Leory Allinson)  Final      Susceptibility   Pseudomonas aeruginosa - MIC*    CEFTAZIDIME 4 SENSITIVE Sensitive     CIPROFLOXACIN >=4 RESISTANT Resistant     GENTAMICIN RESISTANT Resistant     IMIPENEM 1 SENSITIVE Sensitive     PIP/TAZO 32 SENSITIVE Sensitive     CEFEPIME 4 SENSITIVE Sensitive     * >=100,000 COLONIES/mL PSEUDOMONAS AERUGINOSA   Staphylococcus species (coagulase negative) - MIC*    CIPROFLOXACIN >=8 RESISTANT Resistant     GENTAMICIN <=0.5 SENSITIVE Sensitive     NITROFURANTOIN <=16 SENSITIVE Sensitive     OXACILLIN >=4 RESISTANT Resistant     TETRACYCLINE <=1 SENSITIVE Sensitive     VANCOMYCIN 1 SENSITIVE Sensitive     TRIMETH/SULFA <=10 SENSITIVE Sensitive     CLINDAMYCIN RESISTANT Resistant     RIFAMPIN <=0.5 SENSITIVE Sensitive     Inducible Clindamycin POSITIVE Resistant     * 40,000 COLONIES/mL STAPHYLOCOCCUS SPECIES (COAGULASE NEGATIVE)         Radiology Studies: No results found.      Scheduled  Meds: . aspirin EC  81 mg Oral Daily  . escitalopram  10 mg Oral Daily  . fesoterodine  8 mg Oral Daily  . gabapentin  400 mg Oral TID  . Gerhardt's butt cream   Topical BID  . heparin  5,000 Units Subcutaneous Q8H  . hydrALAZINE  50 mg Oral Q8H  . insulin aspart  0-5 Units Subcutaneous QHS  . insulin aspart  0-9 Units Subcutaneous TID WC  . insulin glargine  5 Units Subcutaneous QHS  . levothyroxine  175 mcg Oral QAC breakfast  . mouth rinse  15 mL Mouth Rinse BID  . metoprolol tartrate  25 mg Oral BID  . pramipexole  0.5 mg Oral BID  . senna  2 tablet Oral BID  . sodium chloride flush  3 mL Intravenous Q12H  . tamsulosin  0.4 mg Oral QPC supper  . torsemide  40 mg Oral BID   Continuous Infusions: . sodium chloride       LOS: 5 days    Time spent: over 30 min    Fayrene Helper, MD [facility] Pager [phone]  If 7PM-7AM, please contact night-coverage [email or web address] Password [email or web address] 07/03/2018, 2:58 PM

## 2018-07-03 NOTE — Progress Notes (Signed)
Progress Note  Patient Name: Travis [name] Date of Encounter: 07/03/2018  Primary Cardiologist: Mertie Moores, MD   Subjective   Didn't sleep well last night. Had a hard time tolerating Bipap. No chest pain. Breathing is ok and he feels edema is much better.   Inpatient Medications    Scheduled Meds: . aspirin EC  81 mg Oral Daily  . escitalopram  10 mg Oral Daily  . fesoterodine  8 mg Oral Daily  . gabapentin  400 mg Oral TID  . Gerhardt's butt cream   Topical BID  . heparin  5,000 Units Subcutaneous Q8H  . hydrALAZINE  50 mg Oral Q8H  . insulin aspart  0-5 Units Subcutaneous QHS  . insulin aspart  0-9 Units Subcutaneous TID WC  . insulin glargine  5 Units Subcutaneous QHS  . levothyroxine  175 mcg Oral QAC breakfast  . mouth rinse  15 mL Mouth Rinse BID  . metoprolol tartrate  25 mg Oral BID  . pramipexole  0.5 mg Oral BID  . senna  2 tablet Oral BID  . sodium chloride flush  3 mL Intravenous Q12H  . tamsulosin  0.4 mg Oral QPC supper  . torsemide  40 mg Oral BID   Continuous Infusions: . sodium chloride     PRN Meds: sodium chloride, acetaminophen, albuterol, ondansetron (ZOFRAN) IV, sodium chloride flush   Vital Signs    Vitals:   07/03/18 0200 07/03/18 0424 07/03/18 0603 07/03/18 0744  BP: (!) 143/67 135/63  (!) 145/60  Pulse:  62    Resp:  (!) 22    Temp:  98.8 F (37.1 C)  98.3 F (36.8 C)  TempSrc:  Oral  Oral  SpO2: (!) 88% 93%    Weight:   282 lb 10.1 oz (128.2 kg)   Height:        Intake/Output Summary (Last 24 hours) at 07/03/2018 1128 Last data filed at 07/03/2018 0600 Gross per 24 hour  Intake 960 ml  Output 2350 ml  Net -1390 ml   Filed Weights   07/01/18 0428 07/02/18 0414 07/03/18 0603  Weight: 282 lb 6.6 oz (128.1 kg) 285 lb 0.9 oz (129.3 kg) 282 lb 10.1 oz (128.2 kg)    Telemetry    NSR, sinus brady. HR 55-65 - Personally Reviewed  ECG    none- Personally Reviewed  Physical Exam   GEN: No acute distress.   Neck: No  JVD Cardiac: RRR, no murmurs, rubs, or gallops.  Respiratory: Clear to auscultation bilaterally. GI: Soft, nontender, non-distended  MS: 1+ edema legs in ACE wraps; No deformity. Neuro:  Nonfocal  Psych: Normal affect   Labs    Chemistry Recent Labs  Lab 06/28/18 1949  07/01/18 0215 07/02/18 0327 07/03/18 0254  NA 146*   < > 145 142 141  K 4.7   < > 3.8 3.8 3.7  CL 99   < > 94* 93* 94*  CO2 36*   < > 41* 40* 40*  GLUCOSE 217*   < > 147* 137* 158*  BUN 51*   < > 38* 36* 36*  CREATININE 1.78*   < > 1.38* 1.48* 1.57*  CALCIUM 9.1   < > 8.9 8.5* 8.7*  PROT 7.7  --   --   --   --   ALBUMIN 3.8  --   --   --   --   AST 21  --   --   --   --  ALT 26  --   --   --   --   ALKPHOS 87  --   --   --   --   BILITOT 0.8  --   --   --   --   GFRNONAA 36*   < > 49* 45* 42*  GFRAA 42*   < > 57* 52* 49*  ANIONGAP 11   < > 10 9 7    < > = values in this interval not displayed.     Hematology Recent Labs  Lab 07/01/18 0215 07/02/18 0327 07/03/18 0254  WBC 4.8 5.0 4.9  RBC 3.79* 3.63* 3.58*  HGB 10.6* 10.0* 10.0*  HCT 37.1* 34.8* 33.8*  MCV 97.9 95.9 94.4  MCH 28.0 27.5 27.9  MCHC 28.6* 28.7* 29.6*  RDW 15.7* 15.3 15.7*  PLT 157 129* 149*    Cardiac Enzymes Recent Labs  Lab 06/28/18 1949 06/29/18 0127 06/29/18 0723 06/29/18 1235  TROPONINI 0.03* 0.03* 0.03* 0.03*   No results for input(s): TROPIPOC in the last 168 hours.   BNP Recent Labs  Lab 06/28/18 1952  BNP 319.2*     DDimer No results for input(s): DDIMER in the last 168 hours.   Radiology    No results found.  Cardiac Studies   Echocardiogram 06/29/18: Study Conclusions  - Left ventricle: The cavity size was normal. Wall thickness was normal. Systolic function was normal. The estimated ejection fraction was in the range of 55% to 60%. Wall motion was normal; there were no regional wall motion abnormalities. Doppler parameters are consistent with abnormal left ventricular relaxation  (grade 1 diastolic dysfunction).  Impressions:  - Technically difficult; definity used; normal LV systolic function; mild diastolic dysfunction.    Patient Profile     72 y.o. male with a hx of chronic diastolic heart failure, hypertension, hypothyroidism, diabetes mellitus, obstructive sleep apnea, mild pulmonary hypertension and severe lumbar and thoracic spinal stenosis (s/p decompressive laminectomy)who is being followed by cardiology for the evaluation ofacute on chronic diastolic heart failure.    Assessment & Plan    1. Acute on chronic respiratory failure: likely multifactorial in the setting of acute on chronic diastolic CHF and OSH. He was diuresed with IV lasix and transitioned to po torsemide 2 days ago. He has had good UOP with net -910 mL in the last 24 hours with net -10.5L this admission. ? Weight accuracy given trend: 282 lbs today.   Appears to be maintaining effective diuresis on oral lasix.  - Continue po torsemide 40mg  BID for now - can resume home regimen at discharge: 60mg  BID on M/W/F and 40mg  BID on other days.  - Continue metoprolol at lower dose of 25 mg bid due to bradycardia - Primary team to look into options for measuring his weights at home - programs or devices that will allow for daily weights.  - patient is stable for DC to SNF from our standpoint. Will need follow up with Dr. Acie Fredrickson post discharge.  2. HTN: BP intermittently elevated but stable  - Continue current regimen  3. OSA/OHS: Pulmonary evaluated yesterday; recommended evaluation by sleep doc outpatient for consideration of progression to Bipap qHS.  - Continue management per pulmonary team        For questions or updates, please contact [facility] Please consult [email or web address] for contact info under Cardiology/STEMI.      Signed, Peter Martinique, MD  07/03/2018, 11:28 AM

## 2018-07-04 LAB — CBC
HCT: 34.2 % — ABNORMAL LOW (ref 39.0–52.0)
HEMOGLOBIN: 10.1 g/dL — AB (ref 13.0–17.0)
MCH: 27.9 pg (ref 26.0–34.0)
MCHC: 29.5 g/dL — ABNORMAL LOW (ref 30.0–36.0)
MCV: 94.5 fL (ref 78.0–100.0)
Platelets: 143 10*3/uL — ABNORMAL LOW (ref 150–400)
RBC: 3.62 MIL/uL — AB (ref 4.22–5.81)
RDW: 15.6 % — ABNORMAL HIGH (ref 11.5–15.5)
WBC: 4.8 10*3/uL (ref 4.0–10.5)

## 2018-07-04 LAB — BLOOD GAS, ARTERIAL
ACID-BASE EXCESS: 15.4 mmol/L — AB (ref 0.0–2.0)
Bicarbonate: 40.9 mmol/L — ABNORMAL HIGH (ref 20.0–28.0)
Delivery systems: POSITIVE
Drawn by: 249101
EXPIRATORY PAP: 5
INSPIRATORY PAP: 20
Mode: POSITIVE
O2 CONTENT: 5 L/min
O2 SAT: 93.2 %
PATIENT TEMPERATURE: 98.6
PCO2 ART: 65.6 mmHg — AB (ref 32.0–48.0)
PH ART: 7.411 (ref 7.350–7.450)
PO2 ART: 66.7 mmHg — AB (ref 83.0–108.0)

## 2018-07-04 LAB — BASIC METABOLIC PANEL
ANION GAP: 7 (ref 5–15)
BUN: 37 mg/dL — AB (ref 8–23)
CHLORIDE: 96 mmol/L — AB (ref 98–111)
CO2: 39 mmol/L — ABNORMAL HIGH (ref 22–32)
Calcium: 8.4 mg/dL — ABNORMAL LOW (ref 8.9–10.3)
Creatinine, Ser: 1.49 mg/dL — ABNORMAL HIGH (ref 0.61–1.24)
GFR, EST AFRICAN AMERICAN: 52 mL/min — AB (ref >60)
GFR, EST NON AFRICAN AMERICAN: 45 mL/min — AB (ref >60)
Glucose, Bld: 151 mg/dL — ABNORMAL HIGH (ref 70–99)
POTASSIUM: 3.8 mmol/L (ref 3.5–5.1)
SODIUM: 142 mmol/L (ref 135–145)

## 2018-07-04 LAB — MAGNESIUM: MAGNESIUM: 2.2 mg/dL (ref 1.7–2.4)

## 2018-07-04 LAB — GLUCOSE, CAPILLARY
Glucose-Capillary: 152 mg/dL — ABNORMAL HIGH (ref 70–99)
Glucose-Capillary: 237 mg/dL — ABNORMAL HIGH (ref 70–99)

## 2018-07-04 MED ORDER — GERHARDT'S BUTT CREAM: 1.0000 "application " | TOPICAL_CREAM | Freq: Two times a day (BID) | CUTANEOUS | 0 refills | Status: AC

## 2018-07-04 MED ORDER — METOPROLOL TARTRATE 25 MG PO TABS: 25.0000 mg | ORAL_TABLET | Freq: Two times a day (BID) | ORAL | 0 refills | Status: DC

## 2018-07-04 NOTE — Progress Notes (Signed)
Progress Note  Patient Name: Travis [name] Date of Encounter: 07/04/2018  Primary Cardiologist: Mertie Moores, MD   Subjective   Awoke disoriented last night from sleep. Better now and oriented. Able to wear Bipap all night.  No chest pain. Breathing is ok and he feels edema is much better.   Inpatient Medications    Scheduled Meds: . aspirin EC  81 mg Oral Daily  . escitalopram  10 mg Oral Daily  . fesoterodine  8 mg Oral Daily  . gabapentin  400 mg Oral TID  . Gerhardt's butt cream   Topical BID  . heparin  5,000 Units Subcutaneous Q8H  . hydrALAZINE  50 mg Oral Q8H  . insulin aspart  0-5 Units Subcutaneous QHS  . insulin aspart  0-9 Units Subcutaneous TID WC  . insulin glargine  5 Units Subcutaneous QHS  . levothyroxine  175 mcg Oral QAC breakfast  . mouth rinse  15 mL Mouth Rinse BID  . metoprolol tartrate  25 mg Oral BID  . pramipexole  0.5 mg Oral BID  . senna  2 tablet Oral BID  . sodium chloride flush  3 mL Intravenous Q12H  . tamsulosin  0.4 mg Oral QPC supper  . torsemide  40 mg Oral BID   Continuous Infusions: . sodium chloride     PRN Meds: sodium chloride, acetaminophen, albuterol, ondansetron (ZOFRAN) IV, sodium chloride flush   Vital Signs    Vitals:   07/04/18 0400 07/04/18 0500 07/04/18 0600 07/04/18 0747  BP: (!) 147/59   (!) 126/56  Pulse:    66  Resp:    15  Temp:    98.1 F (36.7 C)  TempSrc:    Oral  SpO2: (!) 89%  91% 94%  Weight:  275 lb 2.2 oz (124.8 kg)    Height:        Intake/Output Summary (Last 24 hours) at 07/04/2018 0900 Last data filed at 07/04/2018 0600 Gross per 24 hour  Intake 660 ml  Output 1725 ml  Net -1065 ml   Filed Weights   07/02/18 0414 07/03/18 0603 07/04/18 0500  Weight: 285 lb 0.9 oz (129.3 kg) 282 lb 10.1 oz (128.2 kg) 275 lb 2.2 oz (124.8 kg)    Telemetry    NSR, sinus brady. HR 55-65 - Personally Reviewed  ECG    none- Personally Reviewed  Physical Exam   GEN: No acute distress.   Neck: No  JVD Cardiac: RRR, no murmurs, rubs, or gallops.  Respiratory: Clear to auscultation bilaterally. GI: Soft, nontender, non-distended  MS: 1+ edema legs in ACE wraps; No deformity. Neuro:  Nonfocal  Psych: Normal affect   Labs    Chemistry Recent Labs  Lab 06/28/18 1949  07/02/18 0327 07/03/18 0254 07/04/18 0320  NA 146*   < > 142 141 142  K 4.7   < > 3.8 3.7 3.8  CL 99   < > 93* 94* 96*  CO2 36*   < > 40* 40* 39*  GLUCOSE 217*   < > 137* 158* 151*  BUN 51*   < > 36* 36* 37*  CREATININE 1.78*   < > 1.48* 1.57* 1.49*  CALCIUM 9.1   < > 8.5* 8.7* 8.4*  PROT 7.7  --   --   --   --   ALBUMIN 3.8  --   --   --   --   AST 21  --   --   --   --  ALT 26  --   --   --   --   ALKPHOS 87  --   --   --   --   BILITOT 0.8  --   --   --   --   GFRNONAA 36*   < > 45* 42* 45*  GFRAA 42*   < > 52* 49* 52*  ANIONGAP 11   < > 9 7 7    < > = values in this interval not displayed.     Hematology Recent Labs  Lab 07/02/18 0327 07/03/18 0254 07/04/18 0320  WBC 5.0 4.9 4.8  RBC 3.63* 3.58* 3.62*  HGB 10.0* 10.0* 10.1*  HCT 34.8* 33.8* 34.2*  MCV 95.9 94.4 94.5  MCH 27.5 27.9 27.9  MCHC 28.7* 29.6* 29.5*  RDW 15.3 15.7* 15.6*  PLT 129* 149* 143*    Cardiac Enzymes Recent Labs  Lab 06/28/18 1949 06/29/18 0127 06/29/18 0723 06/29/18 1235  TROPONINI 0.03* 0.03* 0.03* 0.03*   No results for input(s): TROPIPOC in the last 168 hours.   BNP Recent Labs  Lab 06/28/18 1952  BNP 319.2*     DDimer No results for input(s): DDIMER in the last 168 hours.   Radiology    No results found.  Cardiac Studies   Echocardiogram 06/29/18: Study Conclusions  - Left ventricle: The cavity size was normal. Wall thickness was normal. Systolic function was normal. The estimated ejection fraction was in the range of 55% to 60%. Wall motion was normal; there were no regional wall motion abnormalities. Doppler parameters are consistent with abnormal left ventricular relaxation  (grade 1 diastolic dysfunction).  Impressions:  - Technically difficult; definity used; normal LV systolic function; mild diastolic dysfunction.    Patient Profile     72 y.o. male with a hx of chronic diastolic heart failure, hypertension, hypothyroidism, diabetes mellitus, obstructive sleep apnea, mild pulmonary hypertension and severe lumbar and thoracic spinal stenosis (s/p decompressive laminectomy)who is being followed by cardiology for the evaluation ofacute on chronic diastolic heart failure.    Assessment & Plan    1. Acute on chronic respiratory failure: likely multifactorial in the setting of acute on chronic diastolic CHF and OSH. He was diuresed with IV lasix and transitioned to po torsemide 3 days ago. He has had good UOP with net -1725 mL in the last 24 hours with net -11.6L this admission. Weight is down to 275 lbs Appears to be maintaining effective diuresis on oral diuretic  - Continue po torsemide 40mg  BID for now - can resume home regimen at discharge: 60mg  BID on M/W/F and 40mg  BID on other days.  - Continue metoprolol at lower dose of 25 mg bid due to bradycardia - Primary team to look into options for measuring his weights at home - programs or devices that will allow for daily weights.  - patient is stable for DC to SNF from our standpoint. Will need follow up with Dr. Acie Fredrickson post discharge in about 3-4 weeks.  2. HTN: BP intermittently elevated but stable  - Continue current regimen  3. OSA/OHS: on Bipap at night. - Continue management per pulmonary team        For questions or updates, please contact [place] Please consult [email or web address] for contact info under Cardiology/STEMI.      Signed, Nicolae Vasek Martinique, MD  07/04/2018, 9:00 AM

## 2018-07-04 NOTE — Progress Notes (Signed)
Noted CM consult for standing scale. Patient will DC to SNF, all DME will be arranged through SNF prior to his DC from there.

## 2018-07-04 NOTE — Discharge Summary (Addendum)
Physician Discharge Summary  Travis [name] LSL:[id number] DOB: [birth year] DOA: 06/28/2018  PCP: Travis Arabian, MD  Admit date: 06/28/2018 Discharge date: 07/04/2018  Time spent: 40 minutes  Recommendations for Outpatient Follow-up:  1. Follow up outpatient CBC/CMP 2. Follow up weights.  Long term, when d/c'd from SNF, needs reliable method for weighing daily (device or program that'd allow him to weigh daily - discussed with CM here, who noted he'd need to f/u this up with SNF for after d/c from there) 3. Follow up with sleep study from pulmonology.  May f/u HRCT? 4. Ensure compliance with bipap nightly  5. O2 saturation goal is 88-94% 6. Continue torsemide as prescribed 40 BID (except 60 mg BID three times weekly) 7. Continue wound care as noted below.  Unna boots are changed twice weekly (will be due for change today or tomorrow). 8. Resume home glimeperide, follow ACHS BG's 9. Discussed having palliative care follow at SNF, which he was agreeable to.  Please have palliative care follow up at SNF.   Discharge Diagnoses:  Principal Problem:   Acute encephalopathy Active Problems:   OSA on CPAP   Hypothyroid   Elevated troponin I level   Urinary tract infection without hematuria   Stage 3 chronic kidney disease (HCC)   Restrictive lung disease   Morbid obesity (HCC)   Acute on chronic respiratory failure with hypoxia and hypercapnia (HCC)   Benign essential HTN   Acute on chronic diastolic (congestive) heart failure (HCC)   Poorly controlled type 2 diabetes mellitus with peripheral neuropathy [facility])   Discharge Condition: stable  Diet recommendation: heart healthy  Filed Weights   07/02/18 0414 07/03/18 0603 07/04/18 0500  Weight: 129.3 kg (285 lb 0.9 oz) 128.2 kg (282 lb 10.1 oz) 124.8 kg (275 lb 2.2 oz)    History of present illness:  Travis [name] 72 y.o.malewith medical history significant fortype 2 diabetes mellitus, hypothyroidism, hypertension, chronic  kidney disease stage III, COPD, and chronic hypoxic respiratory failure, now presenting to the emergency department for evaluation of somnolence and confusion. History is obtained primarily from the patient's wife and daughter at the bedside as well as EMR and discussion with ED personnel. Patient had back surgery in April and has reportedly been more fatigued since that time, but this has progressed significantly over the past week or so, over which interval he has been sleeping most of the day and seems to be mildly confused at times. He has been noted to wake from sleep acutely short of breath multiple times every night for the past few days. He has had progressive bilateral lower extremity swelling and recently had diuretics increased for this reason, but with no appreciable improvement. He was diagnosed with UTI on 06/25/2018 and started on ciprofloxacin. His condition has not improved with treatment of this. He denies chest pain, fevers, or chills.  He was admitted for acute hypoxic hypercarbic respiratory failure with confusion.  He improved with Bipap and diuresis.  He was seen by pulmonology who recommended outpatient sleep study and keeping O2 sat 88-94%.  He was also seen by cardiology who assisted with diuresis and recommending outpatient follow up.  See below for additional details.  Hospital Course:  Metabolic encephalopathy due to CO2 retention/hypercarbic respiratory failure - continue BIPAP nightly - normal B12, folate, RPR, TSH.  Ammonia very mildly elevated. - woke up last night disoriented, but appropriate this morning, suspect delirium  Acute Hypoxic Hypercarbic Respiratory Failure: Presented with hypercarbia and now on 5  L by [place] (on 2 L by [place] at home) Diuresing as noted below Follows with pulm as outpatient.  Severe obstructive disease as well as severe restriction.  Will continue diuresis as noted below and consult pulmonology for his COPD Per Pulm - recommending  continued diuresis, transition to bipap if eligible (they're arranging for pt to see sleep specialists).   No evidence of COPD based on PFT's, may recommend HRCT at some point.  O2 goal sats 88-94%, currently on 5-6 L by  Important that he's compliant with bipap nightly.  Acute on chronic diastolic CHF,history of pulmonary hypertension - Has increased peripheral edema despite recent increase in torsemide,he also has home health RN placed wrap on his legs started two weeks ago, he reports wraps are changed twice Travis [name] 72 week -cxr "BILATERAL pulmonary opacities are seen representing vascular congestion, similar aeration to priors. Bibasilar atelectasis is superimposed." -BNP 319, elevated when compared to priors -IV Lasix, continue at 60 -> resume home torsemide (was on 40 BID with 60 BID MWF) - Continue beta-blocker (metop decreased to 25 mg due to brady) - Echocardiogram with normal EF, grade 1 diastolic dysfunction (tech diff study, see below) - LE dopplers negative for DVT - Cardiology c/s appreciate recs - Needs reliable method to check weights once home (discussed with CM who notes will need to f/u with SNF when nearing d/c)  - F/u with Dr. Acie Fredrickson 3-4 weeks  Bradycardia: sinus brady on tele, decrease metoprolol to 25 mg BID.  Continue to monitor.  Coronary artery disease,cardiac [id number] CAD -No chest pain -Troponin mild and flat at 0.03 -EKG with chronic right bundle branch block.PVCs,no acute ST-T changes -Continue aspirin/beta-blocker  IOM:BTDHRC onlopressor/hydralazine, diuretics  Non-insulin-dependent type 2 diabetes -A1c 7.7% in March 2019 -resume home glimeperide - continue gabapentin (? Contribution to edema)  CKD 3 -Creatinine at baseline (fluctuating),renally dose medications -Currently no urinary symptoms,UA obtained in the ED unremarkable, nofever no leukocytosis  Metabolic Alkalosis:  2/2 resp acidosis  UTI: growing coag  negative staph and pseudomonas here.  Both resistant to cipro.  Given lack of symptoms, suspect component of colonization, possibly contaminant with coag negative staph. Hold abx, follow Treat only if symptomatic  BPHon Flomax  Hypothyroidism,TSH 1.17,continue Synthroid  OSA on CPAP at night Body mass index is 37.2 kg/m.  Decubitus ulcer  LE wounds: Wound c/s, appreciate recs.  Also has LE wound (had toenail removed recently).  Frequent turns.    Procedures: [place] Final Interpretation: Right: There is no evidence of deep vein thrombosis in the lower extremity. No cystic structure found in the popliteal fossa. Left: There is no evidence of deep vein thrombosis in the lower extremity. No cystic structure found in the popliteal fossa.  Echo Study Conclusions  - Left ventricle: The cavity size was normal. Wall thickness was   normal. Systolic function was normal. The estimated ejection   fraction was in the range of 55% to 60%. Wall motion was normal;   there were no regional wall motion abnormalities. Doppler   parameters are consistent with abnormal left ventricular   relaxation (grade 1 diastolic dysfunction).  Impressions:  - Technically difficult; definity used; normal LV systolic   function; mild diastolic dysfunction.  Consultations:  Cards  Pulm  Discharge Exam: Vitals:   07/04/18 0600 07/04/18 0747  BP:  (!) 126/56  Pulse:  66  Resp:  15  Temp:  98.1 F (36.7 C)  SpO2: 91% 94%   Feels well, Travis [name]&Ox3. No pain.   General:  No acute distress. Cardiovascular: Heart sounds show Travis [name] regular rate, and rhythm. Lungs: Clear to auscultation bilaterally  Abdomen: Soft, nontender, nondistended  Neurological: Alert and oriented 3. Moves all extremities 4. Cranial nerves II through XII grossly intact. Skin: Warm and dry. No rashes or lesions. Extremities: No clubbing or cyanosis. No edema.  Psychiatric: Mood and affect are normal. Insight and judgment are  appropriate.  Discharge Instructions   Discharge Instructions    (HEART FAILURE PATIENTS) Call MD:  Anytime you have any of the following symptoms: 1) 3 pound weight gain in 24 hours or 5 pounds in 1 week 2) shortness of breath, with or without Travis [name] dry hacking cough 3) swelling in the hands, feet or stomach 4) if you have to sleep on extra pillows at night in order to breathe.   Complete by:  As directed    Call MD for:  difficulty breathing, headache or visual disturbances   Complete by:  As directed    Call MD for:  extreme fatigue   Complete by:  As directed    Call MD for:  persistant dizziness or light-headedness   Complete by:  As directed    Call MD for:  persistant nausea and vomiting   Complete by:  As directed    Call MD for:  redness, tenderness, or signs of infection (pain, swelling, redness, odor or green/yellow discharge around incision site)   Complete by:  As directed    Call MD for:  severe uncontrolled pain   Complete by:  As directed    Call MD for:  temperature >100.4   Complete by:  As directed    Diet - low sodium heart healthy   Complete by:  As directed    Diet - low sodium heart healthy   Complete by:  As directed    Discharge instructions   Complete by:  As directed    You were seen for confusion and shortness of breath.  This was due to Travis [name] of your chronic lung disease and your heart failure.  You improved with bipap and lasix (diuretic).  You will need to use the bipap nightly, indefinitely.  You will need to follow up with an outpatient sleep study with pulmonology.  Check your weight daily.  If it increases more than 2-3 lbs in 1 day or more than 5 lbs in 1 week, please call your PCP.  Return for new, recurrent, or worsening symptoms.  Please ask your PCP to request records from this hospitalization so they know what was done and what the next steps will be.   Discharge wound care:   Complete by:  As directed    Cleanse left great toe  with NS. Apply Xeroform to nail bed.  Cover with dry dressing and tape.  Change daily.  Keep skin clean of urine or perspiration.  Apply Gerhardts butt paste twice daily to buttocks and sacral area.   For home use only DME Bipap   Complete by:  As directed    Adult nasal mask, medium   Inspiratory pressure:  20   Expiratory pressure:  5   Increase activity slowly   Complete by:  As directed    Increase activity slowly   Complete by:  As directed      Allergies as of 07/04/2018      Reactions   Other Other (See Comments)   ANESTHESIA UNSPECIFIED  -- makes sick, must have antinausea medication in advance.   Levofloxacin In  D5w Itching   Possible infusion reaction 03/29/17. Tolerating oral Levaquin 03/30/17      Medication List    STOP taking these medications   ciprofloxacin 500 MG tablet Commonly known as:  CIPRO     TAKE these medications   aspirin 81 MG EC tablet Take 1 tablet (81 mg total) by mouth daily.   escitalopram 10 MG tablet Commonly known as:  LEXAPRO Take 1 tablet (10 mg total) by mouth daily.   gabapentin 400 MG capsule Commonly known as:  NEURONTIN TAKE 1 CAPSULE (400 MG TOTAL) BY MOUTH EVERY 8 (EIGHT) HOURS.   Gerhardt's butt cream Crea Apply 1 application topically 2 (two) times daily.   glimepiride 2 MG tablet Commonly known as:  AMARYL Take 1 tablet (2 mg total) by mouth daily with breakfast.   hydrALAZINE 50 MG tablet Commonly known as:  APRESOLINE Take 1 tablet (50 mg total) by mouth every 8 (eight) hours.   levothyroxine 175 MCG tablet Commonly known as:  SYNTHROID, LEVOTHROID Take 1 tablet (175 mcg total) by mouth daily.   metoprolol tartrate 25 MG tablet Commonly known as:  LOPRESSOR Take 1 tablet (25 mg total) by mouth 2 (two) times daily. What changed:    medication strength  how much to take   polyethylene glycol packet Commonly known as:  MIRALAX / GLYCOLAX Take 17 g by mouth daily as needed. For constipation   potassium  chloride SA 20 MEQ tablet Commonly known as:  K-DUR,KLOR-CON Take 1 tablet (20 mEq total) by mouth 2 (two) times daily.   pramipexole 0.5 MG tablet Commonly known as:  MIRAPEX Take 1 tablet (0.5 mg total) by mouth 2 (two) times daily.   senna 8.6 MG Tabs tablet Commonly known as:  SENOKOT Take 2 tablets (17.2 mg total) by mouth 2 (two) times daily.   tamsulosin 0.4 MG Caps capsule Commonly known as:  FLOMAX Take 1 capsule (0.4 mg total) by mouth daily.   torsemide 20 MG tablet Commonly known as:  DEMADEX Take 60 mg twice daily on M, W, F and 40 mg twice daily on other days   TOVIAZ 8 MG Tb24 tablet Generic drug:  fesoterodine Take 1 tablet (8 mg total) by mouth daily.            Durable Medical Equipment  (From admission, onward)        Start     Ordered   07/02/18 1218  For home use only DME Bipap  Once    Comments:  Auto titration device, heated / humidity with mask of choice.  Question Answer Comment  Bleed in oxygen (LPM) 3 liters   Keep 02 saturation 88-94%      07/02/18 1218   07/02/18 0000  For home use only DME Bipap    Comments:  Adult nasal mask, medium  Question Answer Comment  Inspiratory pressure 20   Expiratory pressure 5      07/02/18 1027       Discharge Care Instructions  (From admission, onward)        Start     Ordered   07/04/18 0000  Discharge wound care:    Comments:  Cleanse left great toe with NS. Apply Xeroform to nail bed.  Cover with dry dressing and tape.  Change daily.  Keep skin clean of urine or perspiration.  Apply Gerhardts butt paste twice daily to buttocks and sacral area.   07/04/18 1128     Allergies  Allergen Reactions  .  Other Other (See Comments)    ANESTHESIA UNSPECIFIED  -- makes sick, must have antinausea medication in advance.  . Levofloxacin In D5w Itching    Possible infusion reaction 03/29/17. Tolerating oral Levaquin 03/30/17    Contact information for follow-up providers    [name], MD Follow  up on 08/11/2018.   Specialty:  Pulmonary Disease Why:  Appt at 2:30 PM for sleep evaluation Contact information: [address] [place] [address] [phone]        Travis Arabian, MD Follow up.   Specialty:  Family Medicine Contact information: [address], [place] [address] [phone]        Nahser, Wonda Cheng, MD .   Specialty:  Cardiology Contact information: [address] [place] [address] [phone]            Contact information for after-discharge care    Destination    [facility] .   Service:  Skilled Nursing Contact information: [place] [phone]                   The results of significant diagnostics from this hospitalization (including imaging, microbiology, ancillary and laboratory) are listed below for reference.    Significant Diagnostic Studies: Ct Abdomen Pelvis Wo Contrast  Result Date: 06/28/2018 CLINICAL DATA:  Weakness and fatigue. Patient diagnosed with the urinary tract infection 06/25/2018. EXAM: CT ABDOMEN AND PELVIS WITHOUT CONTRAST TECHNIQUE: Multidetector CT imaging of the abdomen and pelvis was performed following the standard protocol without IV contrast. COMPARISON:  CT abdomen and pelvis 08/22/2015. FINDINGS: Lower chest: There is cardiomegaly. Calcific aortic and coronary atherosclerosis is identified. Dependent atelectasis is seen in the lung bases. Hepatobiliary: No focal liver abnormality is seen. Status post cholecystectomy. No biliary dilatation. Pancreas: Unremarkable. No pancreatic ductal dilatation or surrounding inflammatory changes. Fatty atrophy is noted Spleen: Normal in size without focal abnormality. Adrenals/Urinary Tract: Adrenal glands are unremarkable. Kidneys are normal, without renal calculi, focal lesion, or hydronephrosis. Bladder is unremarkable. Stomach/Bowel: Stomach is within normal  limits. Appendix appears normal. No evidence of bowel wall thickening, distention, or inflammatory changes. Vascular/Lymphatic: Aortic atherosclerosis. No enlarged abdominal or pelvic lymph nodes. Reproductive: The patient appears to be status post TURP. Other: No fluid collection. Musculoskeletal: The patient is status post T7-10 and L4-5 fusion. Advanced multilevel spondylosis is identified. No worrisome lesion. No acute abnormality. IMPRESSION: No acute abnormality abdomen or pelvis. Cardiomegaly. Calcific aortic and coronary atherosclerosis. Advanced multilevel thoracic and lumbar spondylosis with postoperative change noted. Electronically Signed   By: Travis Rise M.D.   On: 06/28/2018 20:12   Dg Chest 2 View  Result Date: 06/28/2018 CLINICAL DATA:  Weakness and fatigue.  COPD.  CHF. EXAM: CHEST - 2 VIEW COMPARISON:  05/06/2018. FINDINGS: Midthoracic fusion apparatus appears stable. The heart is enlarged. There is Annaleese Guier considerable amount of air under the RIGHT hemidiaphragm concerning for pneumoperitoneum. Colonic interposition could have this appearance, but this is Dawnielle Christiana new finding from previous. Zonnie Landen lateral decubitus film is recommended for further evaluation. Alternately, noncontrast CT could be performed. BILATERAL pulmonary opacities are seen representing vascular congestion, similar aeration to priors. Bibasilar atelectasis is superimposed. IMPRESSION: Considerable amount of air under the RIGHT hemidiaphragm concerning for pneumoperitoneum. Colonic interposition could have Travis [name] similar appearance, and lateral decubitus film versus noncontrast CT of the abdomen and pelvis is recommended for further evaluation. Travis [name] call has been placed to the ordering provider to  directly communicate the findings. Electronically Signed   By: Travis [name] M.D.   On: 06/28/2018 18:20   Ct Head Wo Contrast  Result Date: 06/28/2018 CLINICAL DATA:  72 year old male with generalized weakness and altered mental status. EXAM: CT  HEAD WITHOUT CONTRAST TECHNIQUE: Contiguous axial images were obtained from the base of the skull through the vertex without intravenous contrast. COMPARISON:  Cervical spine MRI 06/27/2005. FINDINGS: Brain: Cerebral volume is within normal limits for age. No midline shift, ventriculomegaly, mass effect, evidence of mass lesion, intracranial hemorrhage or evidence of cortically based acute infarction. Mild for age nonspecific white matter hypodensity. Otherwise normal gray-white matter differentiation with no cortical encephalomalacia identified. Vascular: Calcified atherosclerosis at the skull base. No suspicious intracranial vascular hyperdensity. Skull: No skull fracture identified. Upper cervical spine degeneration including ligamentous hypertrophy about the odontoid resulting in upper cervical spinal stenosis (series 6, image 29). Carious maxillary dentition. Sinuses/Orbits: Multiple small maxillary sinus mucous retention cysts. Otherwise well pneumatized. Tympanic cavities and mastoids are clear. Other: Postoperative changes to both globes. No acute orbit or scalp soft tissue finding. IMPRESSION: 1. No acute intracranial abnormality. Mild for age nonspecific cerebral white matter changes. 2. 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 Electronically Signed   By: Travis Ann M.D.   On: 06/28/2018 18:42    Microbiology: Recent Results (from the past 240 hour(s))  MRSA PCR Screening     Status: None   Collection Time: 06/29/18  6:25 PM  Result Value Ref Range Status   MRSA by PCR NEGATIVE NEGATIVE Final    Comment:        The GeneXpert MRSA Assay (FDA approved for NASAL specimens only), is one component of Travis [name] comprehensive MRSA colonization surveillance program. It is not intended to diagnose MRSA infection nor to guide or monitor treatment for MRSA infections. Performed at [facility], [place] [address], [place], [place] [address]   Urine Culture     Status: Abnormal   Collection  Time: 06/30/18  2:00 PM  Result Value Ref Range Status   Specimen Description URINE, RANDOM  Final   Special Requests NONE  Final   Culture (Travis [name])  Final    >=100,000 COLONIES/mL PSEUDOMONAS AERUGINOSA 40,000 COLONIES/mL STAPHYLOCOCCUS SPECIES (COAGULASE NEGATIVE)    Report Status 07/02/2018 FINAL  Final   Organism ID, Bacteria PSEUDOMONAS AERUGINOSA (Travis [name])  Final   Organism ID, Bacteria STAPHYLOCOCCUS SPECIES (COAGULASE NEGATIVE) (Travis [name])  Final      Susceptibility   Pseudomonas aeruginosa - MIC*    CEFTAZIDIME 4 SENSITIVE Sensitive     CIPROFLOXACIN >=4 RESISTANT Resistant     GENTAMICIN RESISTANT Resistant     IMIPENEM 1 SENSITIVE Sensitive     PIP/TAZO 32 SENSITIVE Sensitive     CEFEPIME 4 SENSITIVE Sensitive     * >=100,000 COLONIES/mL PSEUDOMONAS AERUGINOSA   Staphylococcus species (coagulase negative) - MIC*    CIPROFLOXACIN >=8 RESISTANT Resistant     GENTAMICIN <=0.5 SENSITIVE Sensitive     NITROFURANTOIN <=16 SENSITIVE Sensitive     OXACILLIN >=4 RESISTANT Resistant     TETRACYCLINE <=1 SENSITIVE Sensitive     VANCOMYCIN 1 SENSITIVE Sensitive     TRIMETH/SULFA <=10 SENSITIVE Sensitive     CLINDAMYCIN RESISTANT Resistant     RIFAMPIN <=0.5 SENSITIVE Sensitive     Inducible Clindamycin POSITIVE Resistant     * 40,000 COLONIES/mL STAPHYLOCOCCUS SPECIES (COAGULASE NEGATIVE)     Labs: Basic Metabolic Panel: Recent Labs  Lab 06/30/18 0337 07/01/18 0215 07/02/18 0327 07/03/18  0254 07/04/18 0320  NA 144 145 142 141 142  K 3.8 3.8 3.8 3.7 3.8  CL 95* 94* 93* 94* 96*  CO2 40* 41* 40* 40* 39*  GLUCOSE 121* 147* 137* 158* 151*  BUN 44* 38* 36* 36* 37*  CREATININE 1.56* 1.38* 1.48* 1.57* 1.49*  CALCIUM 8.4* 8.9 8.5* 8.7* 8.4*  MG  --  2.4 2.3 2.2 2.2   Liver Function Tests: Recent Labs  Lab 06/28/18 1949  AST 21  ALT 26  ALKPHOS 87  BILITOT 0.8  PROT 7.7  ALBUMIN 3.8   No results for input(s): LIPASE, AMYLASE in the last 168 hours. Recent Labs  Lab 06/28/18 1744  06/29/18 0314  AMMONIA 29 36*   CBC: Recent Labs  Lab 06/28/18 1731 06/29/18 0314 07/01/18 0215 07/02/18 0327 07/03/18 0254 07/04/18 0320  WBC 4.5  --  4.8 5.0 4.9 4.8  NEUTROABS 3.5  --   --   --   --   --   HGB 11.6*  --  10.6* 10.0* 10.0* 10.1*  HCT 41.3 33.8* 37.1* 34.8* 33.8* 34.2*  MCV 97.2  --  97.9 95.9 94.4 94.5  PLT 202  --  157 129* 149* 143*   Cardiac Enzymes: Recent Labs  Lab 06/28/18 1949 06/29/18 0127 06/29/18 0723 06/29/18 1235  TROPONINI 0.03* 0.03* 0.03* 0.03*   BNP: BNP (last 3 results) Recent Labs    04/10/18 0405 04/11/18 0407 06/28/18 1952  BNP 238.1* 120.9* 319.2*    ProBNP (last 3 results) No results for input(s): PROBNP in the last 8760 hours.  CBG: Recent Labs  Lab 07/03/18 0743 07/03/18 1140 07/03/18 1738 07/03/18 2119 07/04/18 0728  GLUCAP 135* 184* 185* 119* 152*       Signed:  Fayrene Helper MD.  [facility] 07/04/2018, 11:30 AM

## 2018-07-04 NOTE — Progress Notes (Addendum)
Patient woke up disoriented to place and situation. Took off bi pap mask and called his wife  Frightened. Says he thought he was at his brother's home, couldn't recall having been in the hospital for the past week.  Verbalized that he was afraid and confused. Bodenheimer,NP was notified  Repeat ABG was ordered. Patient was placed back on bi pap  CRITICAL VALUE ALERT  Critical Value: PC02 arterial- 65.6    Date & Time Notied:  07/04/18 0200  Provider Notified: Bodenheimer,NP  Orders Received/Actions taken: No new orders at this time

## 2018-07-04 NOTE — Progress Notes (Signed)
Clinical Social Worker facilitated patient discharge including contacting patient family and facility to confirm patient discharge plans.  Clinical information faxed to facility and family agreeable with plan.  CSW arranged ambulance transport via [facility] to [facility] .  RN to call for report prior to discharge [phone] Rmm [phone]  Clinical Social Worker will sign off for now as social work intervention is no longer needed. Please consult [place] again if new need arises.  Mahamad Shakila Mak LCSWA [phone]

## 2018-07-06 ENCOUNTER — Non-Acute Institutional Stay: Payer: Self-pay | Admitting: Hospice and Palliative Medicine

## 2018-07-06 ENCOUNTER — Ambulatory Visit: Payer: Medicare Other | Admitting: Neurology

## 2018-07-06 DIAGNOSIS — Z515 Encounter for palliative care: Secondary | ICD-10-CM

## 2018-07-07 NOTE — Progress Notes (Signed)
PALLIATIVE CARE CONSULT VISIT   PATIENT NAME: Travis [name] DOB: 10/09/1946 MRN: [id number]  PRIMARY CARE PROVIDER:   [name], MD  REFERRING PROVIDER:  Dr. Sharlett Iles  RESPONSIBLE PARTY:   Self  ASSESSMENT:     I met with patient to discuss goals. I reviewed with him the role of palliative care. He says that prior to his recent hospitalization, he was living at home with his wife and was still working a Network engineer job in [employer]. He used a walker to ambulate but was limited by his breathing and weakness. He feels that rehab has been going well but seems to recognize that he may not return to his previous functional baseline. His primary goal is to return home with his wife.   Patient is comfortable without any acute or distressing symptoms. His oral intake is adequate.   We discussed advance directives. Patient thinks that he has both a living will and HCPOA. He would want his wife to make decisions if he were unable. He does not think that he would want heroic or life prolonging measures at end of life but feels that he would want to speak with his wife prior to making these decisions. Patient would clearly be high risk for intubation given his chronic respiratory failure.   RECOMMENDATIONS and PLAN:  1. Continue rehab 2. Will follow. Would consider palliative care support at home with future use of hospice in the setting of decline.   I spent 45 minutes providing this consultation,  from 1200 to 1245. More than 50% of the time in this consultation was spent coordinating communication.   HISTORY OF PRESENT ILLNESS:  Travis [name] is a 72 y.o. year old male with multiple medical problems including O2 dependent COPD (5L), chronic respiratory failure, OSA on CPAP, CKD III, obesity, DM with neuropathy, and dCHF, who was recently hospitalized 06/28/18 to 07/04/18 with AMS secondary to acute on chronic hypercarbic respiratory failure. Patient was discharged to rehab and Palliative Care  was asked to help address goals of care.   CODE STATUS: FC  PPS: 40% HOSPICE ELIGIBILITY/DIAGNOSIS: YES  PAST MEDICAL HISTORY:  Past Medical History:  Diagnosis Date  . Cervical myelopathy (HCC)    with myelomalacia at C5-6 and C3-4  . CHF (congestive heart failure) ([place])   . Constipation - functional    controlled with miralax  . COPD (chronic obstructive pulmonary disease) (HCC)    mild  . DJD (degenerative joint disease)    WHOLE SPINE  . Dyspnea   . Gait disorder   . Heart murmur    years ago   . Hypertension   . Hypothyroidism    Low d/t treatment of hyperthyroidism  . Lumbosacral spinal stenosis 03/13/2014  . On home oxygen therapy    "2.5L all the time" (06/29/2018)  . Peripheral neuropathy   . Pneumonia   . PONV (postoperative nausea and vomiting)   . RBBB   . Renal disorder    acute kidney failure  . Restless legs syndrome 03/14/2015  . RLS (restless legs syndrome)   . Sleep apnea    DX  2009/2010 study done at [facility]  . 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 Type II diabetes mellitus ([place])    diet controlled    SOCIAL HX:  Social History   Tobacco Use  . Smoking status: Former Smoker    Packs/day: 2.00    Years: 20.00    Pack years: 40.00  Types: Cigarettes    Last attempt to quit: 06/28/1985    Years since quitting: 33.0  . Smokeless tobacco: Former Systems developer    Quit date: 06/28/1985  Substance Use Topics  . Alcohol use: No    ALLERGIES:  Allergies  Allergen Reactions  . Other Other (See Comments)    ANESTHESIA UNSPECIFIED  -- makes sick, must have antinausea medication in advance.  . Levofloxacin In D5w Itching    Possible infusion reaction 03/29/17. Tolerating oral Levaquin 03/30/17     PERTINENT MEDICATIONS:  Outpatient Encounter Medications as of 07/06/2018  Medication Sig  . aspirin EC 81 MG EC tablet Take 1 tablet (81 mg total) by mouth daily.  [name] escitalopram (LEXAPRO) 10 MG tablet Take 1 tablet (10 mg total) by mouth daily.  [name] gabapentin  (NEURONTIN) 400 MG capsule TAKE 1 CAPSULE (400 MG TOTAL) BY MOUTH EVERY 8 (EIGHT) HOURS.  [name] glimepiride (AMARYL) 2 MG tablet Take 1 tablet (2 mg total) by mouth daily with breakfast.  . hydrALAZINE (APRESOLINE) 50 MG tablet Take 1 tablet (50 mg total) by mouth every 8 (eight) hours.  . Hydrocortisone (GERHARDT'S BUTT CREAM) CREA Apply 1 application topically 2 (two) times daily.  [name] levothyroxine (SYNTHROID, LEVOTHROID) 175 MCG tablet Take 1 tablet (175 mcg total) by mouth daily.  . metoprolol tartrate (LOPRESSOR) 25 MG tablet Take 1 tablet (25 mg total) by mouth 2 (two) times daily.  . polyethylene glycol (MIRALAX / GLYCOLAX) packet Take 17 g by mouth daily as needed. For constipation  . potassium chloride SA (K-DUR,KLOR-CON) 20 MEQ tablet Take 1 tablet (20 mEq total) by mouth 2 (two) times daily.  . pramipexole (MIRAPEX) 0.5 MG tablet Take 1 tablet (0.5 mg total) by mouth 2 (two) times daily.  [name] senna (SENOKOT) 8.6 MG TABS tablet Take 2 tablets (17.2 mg total) by mouth 2 (two) times daily.  . tamsulosin (FLOMAX) 0.4 MG CAPS capsule Take 1 capsule (0.4 mg total) by mouth daily.  [name] torsemide (DEMADEX) 20 MG tablet Take 60 mg twice daily on M, W, F and 40 mg twice daily on other days  . TOVIAZ 8 MG TB24 tablet Take 1 tablet (8 mg total) by mouth daily.   No facility-administered encounter medications on file as of 07/06/2018.     PHYSICAL EXAM:   General: NAD, frail appearing, obese, sitting in wheelchair Cardiovascular: regular rate and rhythm Pulmonary: poor air movement without wheeze Abdomen: soft, nontender, + bowel sounds GU: no suprapubic tenderness Extremities: some B. LE edema Skin: no rashes Neurological: Weakness but otherwise nonfocal  Irean Hong, NP

## 2018-07-21 ENCOUNTER — Ambulatory Visit: Payer: Medicare Other | Admitting: Neurology

## 2018-07-22 NOTE — Progress Notes (Signed)
Cardiology Office Note:    Date:  07/23/2018   ID:  RENALD HAITHCOCK, DOB [birth year], MRN [id number]  PCP:  [name], MD  Cardiologist:  Mertie Moores, MD  Pulmonologist:  Dr. Vaughan Browner  Referring MD: [name], MD   Chief Complaint  Patient presents with  . Hospitalization Follow-up    CHF    History of Present Illness:    Travis [name] is a 72 y.o. male with diastolic heart failure, chronic kidney disease, hypertension, hypothyroidism, sleep apnea, diabetes, prior tobacco abuse, spinal stenosis.  Cardiac catheterization in 2016 demonstrated moderate nonobstructive coronary artery disease, mild pulmonary hypertension, COPD.  He has had multiple admissions for decompensated heart failure and respiratory failure.  He was last seen in clinic in May 2019 by Daune Perch, NP After admission for back surgery complicated by decompensated heart failure.  He was admitted 7/1-7/7 with acute on chronic hypoxic respiratory failure in the setting of decompensated congestive heart failure.  He was diuresed and followed by Cardiology.  He was DC to SNF.    [name] returns for follow-up.  He is here today with his wife and daughter.  He was discharged from skilled nursing 3 days ago.  Since leaving the hospital, he feels that his breathing is better.  His legs have been wrapped.  His swelling is much improved.  He denies chest discomfort or syncope.  He denies PND.  He does note nausea at times when he strains.  He denies any bleeding issues.  Prior CV studies:   The following studies were reviewed today:  Echocardiogram 06/29/2018 EF 55-60, normal wall motion, grade 1 diastolic dysfunction  Echocardiogram 03/06/17 Mild LVH, EF 65-70, normal wall motion, grade 2 diastolic dysfunction, mild LAE  R/L cardiac catheterization 09/07/15 LM 40 LAD distal 40 LCx ostial 50 RCA proximal 40 EF 55-60 LVEDP 16, mean RA 4, PASP 45, mean PCWP 13  Echo 08/21/15 EF 55-60, normal wall motion,  grade 2 diastolic dysfunction, mild LAE  Nuclear stress test 03/19/15 EF 48, no ischemia, low risk  Past Medical History:  Diagnosis Date  . Cervical myelopathy (HCC)    with myelomalacia at C5-6 and C3-4  . CHF (congestive heart failure) ([place])   . Constipation - functional    controlled with miralax  . COPD (chronic obstructive pulmonary disease) (HCC)    mild  . DJD (degenerative joint disease)    WHOLE SPINE  . Dyspnea   . Gait disorder   . Heart murmur    years ago   . Hypertension   . Hypothyroidism    Low d/t treatment of hyperthyroidism  . Lumbosacral spinal stenosis 03/13/2014  . On home oxygen therapy    "2.5L all the time" (06/29/2018)  . Peripheral neuropathy   . Pneumonia   . PONV (postoperative nausea and vomiting)   . RBBB   . Renal disorder    acute kidney failure  . Restless legs syndrome 03/14/2015  . RLS (restless legs syndrome)   . Sleep apnea    DX  2009/2010 [facility] done [facility]  . 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 Type II diabetes mellitus (HCC)    diet controlled   Surgical Hx: The patient  has a past surgical history that includes Cholecystectomy; Lumbar laminectomy/decompression microdiscectomy (07/14/2012); Cardiac catheterization ([name], 09/07/2015); Eye surgery; Posterior lumbar fusion (04/08/2018); Posterior lumbar fusion (04/08/2018); Back surgery; and Posterior cervical fusion/foraminotomy (04/08/2018).   Current Medications: Current Meds  Medication Sig  .  aspirin EC 81 MG EC tablet Take 1 tablet (81 mg total) by mouth daily.  [name] escitalopram (LEXAPRO) 10 MG tablet Take 1 tablet (10 mg total) by mouth daily.  [name] gabapentin (NEURONTIN) 400 MG capsule TAKE 1 CAPSULE (400 MG TOTAL) BY MOUTH EVERY 8 (EIGHT) HOURS.  [name] glimepiride (AMARYL) 2 MG tablet Take 1 tablet (2 mg total) by mouth daily with breakfast.  . Hydrocortisone (GERHARDT'S BUTT CREAM) CREA Apply 1 application topically 2 (two) times daily.  [name] levothyroxine (SYNTHROID, LEVOTHROID)  175 MCG tablet Take 1 tablet (175 mcg total) by mouth daily.  . polyethylene glycol (MIRALAX / GLYCOLAX) packet Take 17 g by mouth daily as needed. For constipation  . potassium chloride SA (K-DUR,KLOR-CON) 20 MEQ tablet Take 1 tablet (20 mEq total) by mouth 2 (two) times daily.  . pramipexole (MIRAPEX) 0.5 MG tablet Take 1 tablet (0.5 mg total) by mouth 2 (two) times daily.  [name] senna (SENOKOT) 8.6 MG TABS tablet Take 2 tablets (17.2 mg total) by mouth 2 (two) times daily.  . tamsulosin (FLOMAX) 0.4 MG CAPS capsule Take 1 capsule (0.4 mg total) by mouth daily.  [name] torsemide (DEMADEX) 20 MG tablet Take 60 mg twice daily on M, W, F and 40 mg twice daily on other days  . TOVIAZ 8 MG TB24 tablet Take 1 tablet (8 mg total) by mouth daily.  . [DISCONTINUED] hydrALAZINE (APRESOLINE) 50 MG tablet Take 1 tablet (50 mg total) by mouth every 8 (eight) hours.  . [DISCONTINUED] metoprolol tartrate (LOPRESSOR) 25 MG tablet Take 1 tablet (25 mg total) by mouth 2 (two) times daily.  . [DISCONTINUED] torsemide (DEMADEX) 20 MG tablet Take 60 mg twice daily on M, W, F and 40 mg twice daily on other days     Allergies:   Other and Levofloxacin in d5w   Social History   Tobacco Use  . Smoking status: Former Smoker    Packs/day: 2.00    Years: 20.00    Pack years: 40.00    Types: Cigarettes    Last attempt to quit: 06/28/1985    Years since quitting: 33.0  . Smokeless tobacco: Former Systems developer    Quit date: 06/28/1985  Substance Use Topics  . Alcohol use: No  . Drug use: No     Family Hx: The patient's family history includes CAD in his unknown relative; Diabetes in his father; Heart disease in his father; Hypertension in his father; Polycythemia in his mother.  ROS:   Please see the history of present illness.    ROS All other systems reviewed and are negative.   EKGs/Labs/Other Test Reviewed:    EKG:  EKG is not ordered today.    Recent Labs: 06/28/2018: ALT 26; B Natriuretic Peptide 319.2 06/29/2018: TSH  1.171 07/04/2018: BUN 37; Creatinine, Ser 1.49; Hemoglobin 10.1; Magnesium 2.2; Platelets 143; Potassium 3.8; Sodium 142   Recent Lipid Panel Lab Results  Component Value Date/Time   CHOL 124 08/20/2015 02:58 PM   TRIG 82 08/20/2015 02:58 PM   HDL 29 (L) 08/20/2015 02:58 PM   CHOLHDL 4.3 08/20/2015 02:58 PM   LDLCALC 79 08/20/2015 02:58 PM    Physical Exam:    VS:  BP 140/62   Pulse (!) 50   Ht 6\' 1"  (1.854 m)   SpO2 96%   BMI 36.30 kg/m     Wt Readings from Last 3 Encounters:  07/04/18 275 lb 2.2 oz (124.8 kg)  05/20/18 280 lb (127 kg)  05/18/18 276 lb (  125.2 kg)     Physical Exam  Constitutional: He is oriented to person, place, and time. He appears well-developed and well-nourished. No distress.  HENT:  Head: Normocephalic and atraumatic.  Eyes: No scleral icterus.  Neck: Neck supple.  Cardiovascular: Normal rate, regular rhythm and normal heart sounds.  No murmur heard. Pulmonary/Chest: Effort normal. He has no rales.  Abdominal: Soft.  Musculoskeletal: He exhibits edema (trace ankle edema bilat; Legs Wrapped).  Neurological: He is alert and oriented to person, place, and time.  Skin: Skin is warm and dry.  Psychiatric: He has a normal mood and affect.    ASSESSMENT & PLAN:    Chronic diastolic CHF (congestive heart failure) ([place]) Echo 06/29/2018 with EF 55-60 and grade 1 diastolic dysfunction.  He has had multiple admissions with decompensated heart failure and hypoxic respiratory failure.  He is currently much improved with insignificant leg swelling and improved breathing.  His family would like to pursue referral to the advanced heart failure clinic (they request Dr. [name]).  -Continue current dose of torsemide  -Refer to Dr. [name] in the advanced heart failure clinic (3-4 weeks)  -Follow-up with Dr. Acie Fredrickson 3 months  Benign essential HTN Blood pressure above target.  Adjust hydralazine to 75 mg 3 times a day.  CKD (chronic kidney disease), stage III  ([place]) Obtain follow-up BMET today.  OSA on CPAP Continue CPAP.  Chronic obstructive pulmonary disease, unspecified COPD type ([place]) He is on chronic O2.  Continue follow-up with pulmonology.    Bradycardia  His beta-blocker dose was reduced in the hospital.  He describes some episodes of nausea with straining.  Question if this is related to bradycardia (vagal episode leading to worsening bradycardia).  I will reduce his metoprolol to 12.5 mg twice daily.  We may need to discontinue his beta-blocker altogether.   Dispo:  Return in about 3 months (around 10/23/2018) for Routine Follow Up, w/ Dr. Acie Fredrickson.   Medication Adjustments/Labs and Tests Ordered: Current medicines are reviewed at length with the patient today.  Concerns regarding medicines are outlined above.  Tests Ordered: Orders Placed This Encounter  Procedures  . Basic Metabolic Panel (BMET)  . AMB referral to CHF clinic   Medication Changes: Meds ordered this encounter  Medications  . torsemide (DEMADEX) 20 MG tablet    Sig: Take 60 mg twice daily on M, W, F and 40 mg twice daily on other days    Dispense:  450 tablet    Refill:  3  . metoprolol tartrate (LOPRESSOR) 25 MG tablet    Sig: Take 0.5 tablets (12.5 mg total) by mouth 2 (two) times daily.    Dispense:  60 tablet    Refill:  3    DECREASE IN DOSE  . hydrALAZINE (APRESOLINE) 50 MG tablet    Sig: Take 1.5 tablets (75 mg total) by mouth 3 (three) times daily.    Dispense:  378 tablet    Refill:  3    DOSE INCREASE    Signed, Richardson Dopp, [name]  07/23/2018 1:45 PM    [facility] [address], [place], [place]  [address] Phone: [phone]; Fax: [phone]

## 2018-07-23 ENCOUNTER — Telehealth: Payer: Self-pay | Admitting: *Deleted

## 2018-07-23 ENCOUNTER — Ambulatory Visit: Payer: Medicare Other | Admitting: Physician Assistant

## 2018-07-23 ENCOUNTER — Encounter: Payer: Self-pay | Admitting: Physician Assistant

## 2018-07-23 VITALS — BP 140/62 | HR 50 | Ht 73.0 in

## 2018-07-23 DIAGNOSIS — R001 Bradycardia, unspecified: Secondary | ICD-10-CM

## 2018-07-23 DIAGNOSIS — N183 Chronic kidney disease, stage 3 unspecified: Secondary | ICD-10-CM

## 2018-07-23 DIAGNOSIS — Z9989 Dependence on other enabling machines and devices: Secondary | ICD-10-CM

## 2018-07-23 DIAGNOSIS — I10 Essential (primary) hypertension: Secondary | ICD-10-CM | POA: Diagnosis not present

## 2018-07-23 DIAGNOSIS — G4733 Obstructive sleep apnea (adult) (pediatric): Secondary | ICD-10-CM

## 2018-07-23 DIAGNOSIS — I5032 Chronic diastolic (congestive) heart failure: Secondary | ICD-10-CM | POA: Diagnosis not present

## 2018-07-23 DIAGNOSIS — J449 Chronic obstructive pulmonary disease, unspecified: Secondary | ICD-10-CM

## 2018-07-23 LAB — BASIC METABOLIC PANEL
BUN/Creatinine Ratio: 28 — ABNORMAL HIGH (ref 10–24)
BUN: 48 mg/dL — ABNORMAL HIGH (ref 8–27)
CO2: 29 mmol/L (ref 20–29)
Calcium: 9.6 mg/dL (ref 8.6–10.2)
Chloride: 98 mmol/L (ref 96–106)
Creatinine, Ser: 1.69 mg/dL — ABNORMAL HIGH (ref 0.76–1.27)
GFR, EST AFRICAN AMERICAN: 46 mL/min/{1.73_m2} — AB (ref >59)
GFR, EST NON AFRICAN AMERICAN: 39 mL/min/{1.73_m2} — AB (ref >59)
Glucose: 124 mg/dL — ABNORMAL HIGH (ref 65–99)
POTASSIUM: 4.3 mmol/L (ref 3.5–5.2)
SODIUM: 143 mmol/L (ref 134–144)

## 2018-07-23 MED ORDER — HYDRALAZINE HCL 50 MG PO TABS: 75.0000 mg | ORAL_TABLET | Freq: Three times a day (TID) | ORAL | 3 refills | Status: DC

## 2018-07-23 MED ORDER — METOPROLOL TARTRATE 25 MG PO TABS: 12.5000 mg | ORAL_TABLET | Freq: Two times a day (BID) | ORAL | 3 refills | Status: DC

## 2018-07-23 MED ORDER — TORSEMIDE 20 MG PO TABS: ORAL_TABLET | ORAL | 3 refills | Status: DC

## 2018-07-23 NOTE — Patient Instructions (Addendum)
Medication Instructions:  1. TORSEMIDE REFILL SENT TO [facility]  2. DECREASE METOPROLOL TARTRATE TO 12.5 MG TWICE DAILY (THIS WILL BE 1/2 TABLET TWICE DAILY)  3. INCREASE HYDRALAZINE TO 75 MG THREE TIMES A DAY, ( THIS WILL BE 1 AND 1/2 TABLETS THREE TIMES A DAY OF THE 50 MG TABLET)  Labwork: TODAY BMET  Testing/Procedures: NONE ORDERED TODAY  Follow-Up: 1. YOUR ARE BEING REFERRED TO THE HEART FAILURE CLINIC TO SEE DR. [name] IN THE NEXT 3-4 WEEKS   2. DR. Acie Fredrickson IN 3 MONTHS   Any Other Special Instructions Will Be Listed Below (If Applicable).     If you need a refill on your cardiac medications before your next appointment, please call your pharmacy.

## 2018-07-23 NOTE — Telephone Encounter (Signed)
DPR ok to s/w pt's wife [name] who has been notified of pt's lab results. Went over dose change for pt's Torsemide. BMET 09/06/18. Pt's wife verbalized understanding to plan of care and dose change in Torsemide x 1. Pt's wife thanked me for the call.

## 2018-07-23 NOTE — Telephone Encounter (Signed)
-----   Message from Liliane Shi, [place] sent at 07/23/2018  4:12 PM EDT ----- The following abnormalities are noted:  The Creatinine is increased since 07/04/18.   All other values are normal, stable or within acceptable limits. Medication changes / Follow up labs / Other changes or recommendations:   1. Change Torsemide to 60 mg Twice daily on Mondays and Thursdays only (if he has not already taken PM dose of Torsemide today, take 40 mg instead of 60 mg). 2. Continue Torsemide 40 mg Twice daily on all other days. 3. BMET 2 weeks.  Richardson Dopp, [name] 07/23/2018 4:09 PM

## 2018-07-28 ENCOUNTER — Ambulatory Visit: Payer: Medicare Other | Admitting: Cardiovascular Disease

## 2018-07-29 ENCOUNTER — Telehealth: Payer: Self-pay

## 2018-07-29 NOTE — Telephone Encounter (Signed)
Phone call placed to patient. Spoke with wife to schedule visit with Palliative Care.Scheduled for Wednesday 08/04/18

## 2018-08-02 ENCOUNTER — Other Ambulatory Visit: Payer: Self-pay | Admitting: Physical Medicine & Rehabilitation

## 2018-08-04 ENCOUNTER — Other Ambulatory Visit: Payer: Medicare Other | Admitting: Hospice and Palliative Medicine

## 2018-08-04 DIAGNOSIS — Z515 Encounter for palliative care: Secondary | ICD-10-CM

## 2018-08-04 DIAGNOSIS — R531 Weakness: Secondary | ICD-10-CM

## 2018-08-05 NOTE — Progress Notes (Signed)
PALLIATIVE CARE CONSULT VISIT   PATIENT NAME: Travis [name] DOB: 1946-02-06 MRN: [id number]  PRIMARY CARE PROVIDER:   [name], MD  REFERRING PROVIDER:  Dr. Sharlett Iles  RESPONSIBLE PARTY:   Self  ASSESSMENT:     Follow up visit made today in the home with patient, wife, daughter, and son. Since arriving home, patient feels like he is slowly improving. He is mostly in the wheelchair during the day but he is able to take some steps independently with use of a walker. He is still requiring O2 intermittently and uses CPAP at night. Patient has no severe or distressing symptoms at the time of my visit. He complains of chronic neuropathic pain and I note that he takes gabapentin daily. He is on lexapro and consideration could be given to transition him to Cymbalta as an antidepressant and adjuvant neuropathic agent.   Patient has chronic constipation and we discussed adding senna prn to his daily Miralax.   Family are interested in resources in the home. He is currently followed by home health and Travis speak with them about adding CNA to services. Wife would also like for home health RN to manage pill box while they are involved. Travis also have our SW speak with family to discuss resources. We talked about future use of hospice in the setting of decline.   I followed up on my previous conversation about code status. He has not discussed it with his family and I encouraged him to do so.   RECOMMENDATIONS and PLAN:  1. Continue home health and add CNA to services 2. Consider switching from lexapro to Cymbalta.  3. Add senna daily prn for constipation 4. Recommended patient talk with his family about his end of life care wishes.   I spent 45 minutes providing this consultation,  from 1100 to 1145. More than 50% of the time in this consultation was spent coordinating communication.   HISTORY OF PRESENT ILLNESS:  Travis [name] is a 72 y.o. year old male with multiple medical problems  including O2 dependent COPD (5L), chronic respiratory failure, OSA on CPAP, CKD III, obesity, DM with neuropathy, and dCHF, who was recently hospitalized 06/28/18 to 07/04/18 with AMS secondary to acute on chronic hypercarbic respiratory failure. Patient was discharged to rehab and Palliative Care was asked to help address goals of care. Patient is now home from rehab and routine follow up visit was mad etoday.    CODE STATUS: FC  PPS: 40% HOSPICE ELIGIBILITY/DIAGNOSIS: YES  PAST MEDICAL HISTORY:  Past Medical History:  Diagnosis Date  . Cervical myelopathy (HCC)    with myelomalacia at C5-6 and C3-4  . CHF (congestive heart failure) ([place])   . Constipation - functional    controlled with miralax  . COPD (chronic obstructive pulmonary disease) (HCC)    mild  . DJD (degenerative joint disease)    WHOLE SPINE  . Dyspnea   . Gait disorder   . Heart murmur    years ago   . Hypertension   . Hypothyroidism    Low d/t treatment of hyperthyroidism  . Lumbosacral spinal stenosis 03/13/2014  . On home oxygen therapy    "2.5L all the time" (06/29/2018)  . Peripheral neuropathy   . Pneumonia   . PONV (postoperative nausea and vomiting)   . RBBB   . Renal disorder    acute kidney failure  . Restless legs syndrome 03/14/2015  . RLS (restless legs syndrome)   . Sleep apnea  DX  2009/2010 [facility] done at [facility]  . 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 Type II diabetes mellitus ([place])    diet controlled    SOCIAL HX:  Social History   Tobacco Use  . Smoking status: Former Smoker    Packs/day: 2.00    Years: 20.00    Pack years: 40.00    Types: Cigarettes    Last attempt to quit: 06/28/1985    Years since quitting: 33.1  . Smokeless tobacco: Former Systems developer    Quit date: 06/28/1985  Substance Use Topics  . Alcohol use: No    ALLERGIES:  Allergies  Allergen Reactions  . Other Other (See Comments)    ANESTHESIA UNSPECIFIED  -- makes sick, must have antinausea medication in advance.  .  Levofloxacin In D5w Itching    Possible infusion reaction 03/29/17. Tolerating oral Levaquin 03/30/17     PERTINENT MEDICATIONS:  Outpatient Encounter Medications as of 08/04/2018  Medication Sig  . aspirin EC 81 MG EC tablet Take 1 tablet (81 mg total) by mouth daily.  [name] escitalopram (LEXAPRO) 10 MG tablet Take 1 tablet (10 mg total) by mouth daily.  [name] gabapentin (NEURONTIN) 400 MG capsule TAKE 1 CAPSULE (400 MG TOTAL) BY MOUTH EVERY 8 (EIGHT) HOURS.  [name] glimepiride (AMARYL) 2 MG tablet Take 1 tablet (2 mg total) by mouth daily with breakfast.  . hydrALAZINE (APRESOLINE) 50 MG tablet Take 1.5 tablets (75 mg total) by mouth 3 (three) times daily.  [name] levothyroxine (SYNTHROID, LEVOTHROID) 175 MCG tablet Take 1 tablet (175 mcg total) by mouth daily.  . metoprolol tartrate (LOPRESSOR) 25 MG tablet Take 0.5 tablets (12.5 mg total) by mouth 2 (two) times daily.  . polyethylene glycol (MIRALAX / GLYCOLAX) packet Take 17 g by mouth daily as needed. For constipation  . potassium chloride SA (K-DUR,KLOR-CON) 20 MEQ tablet Take 1 tablet (20 mEq total) by mouth 2 (two) times daily.  . pramipexole (MIRAPEX) 0.5 MG tablet Take 1 tablet (0.5 mg total) by mouth 2 (two) times daily.  [name] senna (SENOKOT) 8.6 MG TABS tablet Take 2 tablets (17.2 mg total) by mouth 2 (two) times daily.  . tamsulosin (FLOMAX) 0.4 MG CAPS capsule Take 1 capsule (0.4 mg total) by mouth daily.  [name] torsemide (DEMADEX) 20 MG tablet Take 60 mg every Mon, and Thur; take 40 mg all other days of the week.  . TOVIAZ 8 MG TB24 tablet Take 1 tablet (8 mg total) by mouth daily.   No facility-administered encounter medications on file as of 08/04/2018.     PHYSICAL EXAM:   General: NAD, frail appearing, obese, sitting in wheelchair Cardiovascular: regular rate and rhythm Pulmonary: poor air movement without wheeze Abdomen: soft, nontender, + bowel sounds GU: no suprapubic tenderness Extremities: some B. LE edema Skin: no rashes Neurological:  Weakness but otherwise nonfocal  Irean Hong, NP

## 2018-08-06 ENCOUNTER — Other Ambulatory Visit: Payer: Medicare Other | Admitting: *Deleted

## 2018-08-06 DIAGNOSIS — N183 Chronic kidney disease, stage 3 unspecified: Secondary | ICD-10-CM

## 2018-08-06 LAB — BASIC METABOLIC PANEL
BUN/Creatinine Ratio: 26 — ABNORMAL HIGH (ref 10–24)
BUN: 39 mg/dL — ABNORMAL HIGH (ref 8–27)
CALCIUM: 9 mg/dL (ref 8.6–10.2)
CO2: 31 mmol/L — ABNORMAL HIGH (ref 20–29)
Chloride: 100 mmol/L (ref 96–106)
Creatinine, Ser: 1.5 mg/dL — ABNORMAL HIGH (ref 0.76–1.27)
GFR, EST AFRICAN AMERICAN: 53 mL/min/{1.73_m2} — AB (ref >59)
GFR, EST NON AFRICAN AMERICAN: 46 mL/min/{1.73_m2} — AB (ref >59)
Glucose: 164 mg/dL — ABNORMAL HIGH (ref 65–99)
POTASSIUM: 4.3 mmol/L (ref 3.5–5.2)
Sodium: 145 mmol/L — ABNORMAL HIGH (ref 134–144)

## 2018-08-11 ENCOUNTER — Ambulatory Visit (INDEPENDENT_AMBULATORY_CARE_PROVIDER_SITE_OTHER): Payer: Medicare Other | Admitting: Pulmonary Disease

## 2018-08-11 ENCOUNTER — Encounter: Payer: Self-pay | Admitting: Pulmonary Disease

## 2018-08-11 VITALS — BP 132/64 | HR 92 | Ht 73.0 in | Wt 267.0 lb

## 2018-08-11 DIAGNOSIS — G4733 Obstructive sleep apnea (adult) (pediatric): Secondary | ICD-10-CM

## 2018-08-11 DIAGNOSIS — J984 Other disorders of lung: Secondary | ICD-10-CM | POA: Diagnosis not present

## 2018-08-11 DIAGNOSIS — J986 Disorders of diaphragm: Secondary | ICD-10-CM | POA: Diagnosis not present

## 2018-08-11 DIAGNOSIS — E662 Morbid (severe) obesity with alveolar hypoventilation: Secondary | ICD-10-CM | POA: Diagnosis not present

## 2018-08-11 DIAGNOSIS — J9611 Chronic respiratory failure with hypoxia: Secondary | ICD-10-CM

## 2018-08-11 DIAGNOSIS — J9612 Chronic respiratory failure with hypercapnia: Secondary | ICD-10-CM

## 2018-08-11 NOTE — Patient Instructions (Signed)
Will arrange for in lab CPAP titration study  Follow up with Dr. Halford Chessman in 3 months

## 2018-08-11 NOTE — Progress Notes (Signed)
   Subjective:    Patient ID: Travis [name], male    DOB: 09/22/1946, 72 y.o.   MRN: [id number]  HPI    Review of Systems  Constitutional: Negative.   HENT: Negative.   Eyes: Negative.   Respiratory: Positive for cough and shortness of breath.   Cardiovascular: Negative.   Gastrointestinal: Negative.   Endocrine: Negative.   Genitourinary: Negative.   Musculoskeletal: Negative.   Skin: Negative.   Allergic/Immunologic: Negative.   Neurological: Negative.   Hematological: Negative.   Psychiatric/Behavioral: Negative.        Objective:   Physical Exam        Assessment & Plan:

## 2018-08-11 NOTE — Progress Notes (Signed)
[facility]  Chief Complaint  Patient presents with  . sleep consult    referred by [facility] for sleep consult.     Constitutional: BP 132/64   Pulse 92   Ht 6\' 1"  (1.854 m)   Wt 267 lb (121.1 kg)   SpO2 92%   BMI 35.23 kg/m   History of Present Illness: Travis [name] is a 72 y.o. male former smoker with OSA, OHS, and chronic hypoxic/hypercapnic respiratory failure.  He was seen previously by Dr. Vaughan Browner for shortness of breath.  Found to have restrictive lung disease and right diaphragm dysfunction.  He had sleep study with [facility] in December and started on CPAP.  He was hospitalized in July 2019 with hypoxia and hypercapnic respiratory failure.  Prior to hospitalization he was having edema, shortness of breath, confusion, and lethargy.  This improved with therapy in the hospital and rehab.  He was using Bipap in hospital and rehab.  He was also started on 2 liters continuous flow oxygen.  Since being back home he is using CPAP again.  He uses 5 liters pulsed oxygen when he goes out, but POC doesn't last very long.  Since being back home his wife has noticed that he is getting more sleepy again, and legs are starting to swell more.    He is not having wheeze, sputum, chest pain, fever, or hemoptysis.   Comprehensive Respiratory Exam:  Appearance - somnolent ENMT - nasal mucosa moist, turbinates clear, midline nasal septum, no dental lesions, no gingival bleeding, no oral exudates, no tonsillar hypertrophy Neck - no masses, trachea midline, no thyromegaly, difficult to assess JVP due to body habitus Respiratory - normal appearance of chest wall, normal respiratory effort w/o accessory muscle use, no dullness on percussion, decreased BS b/l, no wheeze/rales CV - s1s2 regular rate and rhythm, no murmurs, 1+ peripheral edema, radial pulses symmetric GI - soft, non tender, no masses Lymph - no adenopathy noted in neck and axillary  areas MSK - sitting in wheelchair Ext - no cyanosis, clubbing, or joint inflammation noted Skin - no rashes, lesions, or ulcers Neuro - oriented to person, place, and time Psych - normal mood and affect  Discussion: He has sleep disordered breathing with chronic hypoxic and hypercapnic respiratory failure.  These are related to obstructive sleep apnea, restrictive lung disease, right diaphragm dysfunction, and obesity hypoventilation syndrome.  He has been on CPAP, but I am concerned he would be better served with Bipap.  He has also been started on supplemental oxygen since his hospital stay.  He would like to consolidate his therapies with pulmonary.  Assessment/Plan:  Obstructive sleep apnea. 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 - discussed importance of weight loss  Chronic diastolic dysfunction. - continue demadex  Cervical myelopathy with gait disorder. - f/u with neurology   Patient Instructions  Will arrange for in lab CPAP titration study  Follow up with Dr. Halford Chessman in 3 months    [name], MD [place] 08/11/2018, 3:31 PM  Flow Sheet  Pulmonary tests: CT angio chest 10/02/17 >> elevated Rt diaphragm (reviewed by me) PFT 10/07/17 >> FEV1 1.69 (47%), FEV1% 77,  TLC 4.22 (55%), DLCO 35%, +BD ABG 07/04/18 >> pH 7.41, PCO2 65.6, PO2 66.7  Sleep tests: HST 12/02/17 >> AHI 20.6, SaO2 low 84%  Cardiac tests: Echo 06/29/18 >> EF 55 to 60%, grade 1 DD  Review of Systems: Constitutional: Negative.   HENT: Negative.   Eyes: Negative.   Respiratory: Positive for cough and shortness of breath.   Cardiovascular: Negative.    Gastrointestinal: Negative.   Endocrine: Negative.   Genitourinary: Negative.   Musculoskeletal: Negative.   Skin: Negative.   Allergic/Immunologic: Negative.   Neurological: Negative.   Hematological: Negative.   Psychiatric/Behavioral: Negative.    Past Medical History: He  has a past medical history of Cervical myelopathy (HCC), CHF (congestive heart failure) ([place]), Constipation - functional, COPD (chronic obstructive pulmonary disease) ([place]), DJD (degenerative joint disease), Dyspnea, Gait disorder, Heart murmur, Hypertension, Hypothyroidism, Lumbosacral spinal stenosis (03/13/2014), On home oxygen therapy, Peripheral neuropathy, Pneumonia, PONV (postoperative nausea and vomiting), RBBB, Renal disorder, Restless legs syndrome (03/14/2015), RLS (restless legs syndrome), Sleep apnea, Thoracic myelopathy, and Type II diabetes mellitus ([place]).  Past Surgical History: He  has a past surgical history that includes Cholecystectomy; Lumbar laminectomy/decompression microdiscectomy (07/14/2012); Cardiac catheterization ([name], 09/07/2015); Eye surgery; Posterior lumbar fusion (04/08/2018); Posterior lumbar fusion (04/08/2018); Back surgery; and Posterior cervical fusion/foraminotomy (04/08/2018).  Family History: His family history includes CAD in his unknown relative; Diabetes in his father; Heart disease in his father; Hypertension in his father; Polycythemia in his mother.  Social History: He  reports that he quit smoking about 33 years ago. His smoking use included cigarettes. He has a 40.00 pack-year smoking history. He quit smokeless tobacco use about 33 years ago. He reports that he does not drink alcohol or use drugs.  Medications: Allergies as of 08/11/2018      Reactions   Other Other (See Comments)   ANESTHESIA UNSPECIFIED  -- makes sick, must have antinausea medication in advance.   Levofloxacin In D5w Itching   Possible infusion reaction 03/29/17. Tolerating oral Levaquin 03/30/17       Medication List        Accurate as of 08/11/18  3:31 PM. Always use your most recent med list.          aspirin 81 MG EC tablet Take 1 tablet (81 mg total) by mouth daily.   escitalopram 10 MG tablet Commonly known as:  LEXAPRO Take 1 tablet (10 mg total) by mouth daily.   gabapentin 400 MG capsule Commonly known as:  NEURONTIN TAKE 1 CAPSULE (400 MG TOTAL) BY MOUTH EVERY 8 (EIGHT) HOURS.   glimepiride 2 MG tablet Commonly known as:  AMARYL Take 1 tablet (2 mg total) by mouth daily with breakfast.   hydrALAZINE 50 MG tablet Commonly known as:  APRESOLINE Take 1.5 tablets (75 mg total) by mouth 3 (three) times daily.   levothyroxine 175 MCG tablet Commonly known as:  SYNTHROID, LEVOTHROID Take 1 tablet (175 mcg total) by mouth daily.   metoprolol tartrate 25 MG tablet Commonly known as:  LOPRESSOR Take 0.5 tablets (12.5 mg total) by mouth 2 (two) times daily.   polyethylene glycol packet Commonly known as:  MIRALAX / GLYCOLAX Take 17 g by mouth daily as needed. For constipation   potassium chloride SA 20 MEQ tablet Commonly known as:  K-DUR,KLOR-CON Take 1 tablet (20 mEq total) by mouth 2 (two) times daily.   pramipexole 0.5 MG tablet Commonly known as:  MIRAPEX Take 1 tablet (0.5 mg total) by mouth 2 (two) times daily.  senna 8.6 MG Tabs tablet Commonly known as:  SENOKOT Take 2 tablets (17.2 mg total) by mouth 2 (two) times daily.   tamsulosin 0.4 MG Caps capsule Commonly known as:  FLOMAX Take 1 capsule (0.4 mg total) by mouth daily.   torsemide 20 MG tablet Commonly known as:  DEMADEX Take 60 mg every Mon, and Thur; take 40 mg all other days of the week.   TOVIAZ 8 MG Tb24 tablet Generic drug:  fesoterodine Take 1 tablet (8 mg total) by mouth daily.

## 2018-08-13 NOTE — Progress Notes (Signed)
ADVANCED HF CLINIC CONSULT NOTE  Referring Physician: Richardson Dopp PA Primary Care: Dr Cynda Familia  Primary Cardiologist: Dr Acie Fredrickson  Pulmonary: Dr Halford Chessman HF MD: Dr [name]  HPI: Travis [name] is a 72 year old referred by Richardson Dopp PA for consultation for heart failure.   Travis [name] is a 72 year old with a history of diastolic heart failure, HTN, hypothyroidism, OSA, DMII, COPD on chronic oxygen, and spinal stenosis.   Admitted 04/08/2018 for decompression lumber laminectomy. Hospital course was complicated by respiratory failure and severe deconditioning. He was discharged to [facility] and went home on 05/12/2018.   Admitted 7/1 through 07/04/18. Admitted with AMS due to CO2 retention/hypercarbic respiratory failure due to CO2 retention and A/C diastolic heart failure. BB decreased in the hospital due to bradycardic. Discharge weight 275 pounds.   Complaining of fatigue and leg edema. SOB with exertion.  Denies /PND/Orthopnea. Wears 2-2.5 liters oxygen at home.  Requires assistance with ADLs. Needs assistance standing up. Uses a wheel chair when he is out of the house. Appetite ok. Drinks Gatorade a few days a week. Says he snacks on water melon and crackers peanut butter.  No fever or chills. Has a hard time weighing at home. Weight at home trending up 272--->276 pounds. Taking all medications.  Cardiac Testing  Echocardiogram 06/29/2018 EF 55-60, normal wall motion, grade 1 diastolic dysfunction Echocardiogram 03/06/17 Mild LVH, EF 65-70, normal wall motion, grade 2 diastolic dysfunction, mild LAE Echo 08/21/15 EF 55-60, normal wall motion, grade 2 diastolic dysfunction, mild LAE  R/L cardiac catheterization 09/07/15 LM 40 LAD distal 40 LCx ostial 50 RCA proximal 40 EF 55-60 LVEDP 16, mean RA 4, PASP 45, mean PCWP 13  Nuclear stress test 03/19/15 EF 48, no ischemia, low risk    Review of Systems: [y] = yes, [ ]  = no   General: Weight gain [Y ]; Weight loss [ ] ; Anorexia [ ] ; Fatigue [Y ];  Fever [ ] ; Chills [ ] ; Weakness [Y ]  Cardiac: Chest pain/pressure [ ] ; Resting SOB [ ] ; Exertional SOB [Y ]; Orthopnea [ ] ; Pedal Edema [Y ]; Palpitations [ ] ; Syncope [ ] ; Presyncope [ ] ; Paroxysmal nocturnal dyspnea[ ]   Pulmonary: Cough [ ] ; Wheezing[ ] ; Hemoptysis[ ] ; Sputum [ ] ; Snoring [ ]   GI: Vomiting[ ] ; Dysphagia[ ] ; Melena[ ] ; Hematochezia [ ] ; Heartburn[ ] ; Abdominal pain [ ] ; Constipation [ ] ; Diarrhea [ ] ; BRBPR [ ]   GU: Hematuria[ ] ; Dysuria [ ] ; Nocturia[ ]   Vascular: Pain in legs with walking [ ] ; Pain in feet with lying flat [ ] ; Non-healing sores [ ] ; Stroke [ ] ; TIA [ ] ; Slurred speech [ ] ;  Neuro: Headaches[ ] ; Vertigo[ ] ; Seizures[ ] ; Paresthesias[ ] ;Blurred vision [ ] ; Diplopia [ ] ; Vision changes [ ]   Ortho/Skin: Arthritis [ ] ; Joint pain [ Y]; Muscle pain [ ] ; Joint swelling [ ] ; Back Pain [Y ]; Rash [ ]   Psych: Depression[ ] ; Anxiety[ ]   Heme: Bleeding problems [ ] ; Clotting disorders [ ] ; Anemia [ ]   Endocrine: Diabetes [Y ]; Thyroid dysfunction[ ]    Past Medical History:  Diagnosis Date  . Cervical myelopathy (HCC)    with myelomalacia at C5-6 and C3-4  . CHF (congestive heart failure) ([place])   . Constipation - functional    controlled with miralax  . COPD (chronic obstructive pulmonary disease) (HCC)    mild  . DJD (degenerative joint disease)    WHOLE SPINE  . Dyspnea   . Gait disorder   .  Heart murmur    years ago   . Hypertension   . Hypothyroidism    Low d/t treatment of hyperthyroidism  . Lumbosacral spinal stenosis 03/13/2014  . On home oxygen therapy    "2.5L all the time" (06/29/2018)  . Peripheral neuropathy   . Pneumonia   . PONV (postoperative nausea and vomiting)   . RBBB   . Renal disorder    acute kidney failure  . Restless legs syndrome 03/14/2015  . RLS (restless legs syndrome)   . Sleep apnea    DX  2009/2010 study done at [facility]  . 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 Type II diabetes mellitus (HCC)    diet controlled     Current Outpatient Medications  Medication Sig Dispense Refill  . aspirin EC 81 MG EC tablet Take 1 tablet (81 mg total) by mouth daily. 30 tablet 0  . escitalopram (LEXAPRO) 10 MG tablet Take 1 tablet (10 mg total) by mouth daily. 30 tablet 2  . gabapentin (NEURONTIN) 400 MG capsule TAKE 1 CAPSULE (400 MG TOTAL) BY MOUTH EVERY 8 (EIGHT) HOURS. 90 capsule 0  . glimepiride (AMARYL) 2 MG tablet Take 1 tablet (2 mg total) by mouth daily with breakfast. 30 tablet 1  . hydrALAZINE (APRESOLINE) 50 MG tablet Take 1.5 tablets (75 mg total) by mouth 3 (three) times daily. 378 tablet 3  . levothyroxine (SYNTHROID, LEVOTHROID) 175 MCG tablet Take 1 tablet (175 mcg total) by mouth daily. 30 tablet 2  . metoprolol tartrate (LOPRESSOR) 25 MG tablet Take 0.5 tablets (12.5 mg total) by mouth 2 (two) times daily. 60 tablet 3  . polyethylene glycol (MIRALAX / GLYCOLAX) packet Take 17 g by mouth daily as needed. For constipation 14 each 0  . potassium chloride SA (K-DUR,KLOR-CON) 20 MEQ tablet Take 1 tablet (20 mEq total) by mouth 2 (two) times daily. 60 tablet 1  . pramipexole (MIRAPEX) 0.5 MG tablet Take 1 tablet (0.5 mg total) by mouth 2 (two) times daily. 60 tablet 0  . senna (SENOKOT) 8.6 MG TABS tablet Take 2 tablets (17.2 mg total) by mouth 2 (two) times daily. 120 each 0  . tamsulosin (FLOMAX) 0.4 MG CAPS capsule Take 1 capsule (0.4 mg total) by mouth daily. 30 capsule 0  . torsemide (DEMADEX) 20 MG tablet Take by mouth. Take 60 mg (3 tabs) twice daily on Mondays & Thursdays & 40 mg (2 tabs) twice daily on all other days.    . TOVIAZ 8 MG TB24 tablet Take 1 tablet (8 mg total) by mouth daily. 30 tablet 11   No current facility-administered medications for this encounter.     Allergies  Allergen Reactions  . Other Other (See Comments)    ANESTHESIA UNSPECIFIED  -- makes sick, must have antinausea medication in advance.  . Levofloxacin In D5w Itching    Possible infusion reaction 03/29/17.  Tolerating oral Levaquin 03/30/17      Social History   Socioeconomic History  . Marital status: Married    Spouse name: carolyn  . Number of children: 3  . Years of education: 30  . Highest education level: Not on file  Occupational History    Employer: [employer] [place]: Works in [place]  . Financial resource strain: Not on file  . Food insecurity:    Worry: Not on file    Inability: Not on file  . Transportation needs:    Medical: Not on  file    Non-medical: Not on file  Tobacco Use  . Smoking status: Former Smoker    Packs/day: 2.00    Years: 20.00    Pack years: 40.00    Types: Cigarettes    Last attempt to quit: 06/28/1985    Years since quitting: 33.1  . Smokeless tobacco: Former Systems developer    Quit date: 06/28/1985  Substance and Sexual Activity  . Alcohol use: No  . Drug use: No  . Sexual activity: Not on file  Lifestyle  . Physical activity:    Days per week: Not on file    Minutes per session: Not on file  . Stress: Not on file  Relationships  . Social connections:    Talks on phone: Not on file    Gets together: Not on file    Attends religious service: Not on file    Active member of club or organization: Not on file    Attends meetings of clubs or organizations: Not on file    Relationship status: Not on file  . Intimate partner violence:    Fear of current or ex partner: Not on file    Emotionally abused: Not on file    Physically abused: Not on file    Forced sexual activity: Not on file  Other Topics Concern  . Not on file  Social History Narrative   Lives with wife,  [name]   Patient is married with 3 children.   Patient has 16 yrs of education.   Patient is right handed.   Patient drinks 2 cups of caffeine per day.      Family History  Problem Relation Age of Onset  . Polycythemia Mother   . Diabetes Father   . Hypertension Father   . Heart disease Father   . CAD Unknown        1 of his 4  brothers and father has CAD    Vitals:   08/16/18 0848  BP: 130/74  Pulse: (!) 56  SpO2: 90%  Weight: 126.1 kg (278 lb)   Wt Readings from Last 3 Encounters:  08/16/18 126.1 kg (278 lb)  08/11/18 121.1 kg (267 lb)  07/04/18 124.8 kg (275 lb 2.2 oz)   ReDS Vest - 08/16/18 0900      ReDS Vest   Travis   No    Estimated volume prior to reading  High    Fitting Posture  Sitting    Height Marker  Tall    Ruler Value  [place]    ReDS Value  42       PHYSICAL EXAM: General:  Appears chronically ill. Arrived in wheel chair. No respiratory difficulty HEENT: normal Neck: supple. no JVD. Carotids 2+ bilat; no bruits. No lymphadenopathy or thryomegaly appreciated. Cor: PMI nondisplaced. Regular rate & rhythm. No rubs, gallops or murmurs. Lungs: clear on 2 liters of oxygen Abdomen: soft, nontender, nondistended. No hepatosplenomegaly. No bruits or masses. Good bowel sounds. Extremities: no cyanosis, clubbing, rash, R and LLE 1-2+ edema.  Neuro: alert & oriented x 3, cranial nerves grossly intact. moves all 4 extremities w/o difficulty. Affect pleasant.  ECG: Sinus Bardycardia 49 bpm    ASSESSMENT & PLAN:  1. Chronic Diastolic Heart Failure ECHO 06/29/2018 EF 55-60% Grade IDD  Reds Reading 42% and he appears overloaded. NYHA III. Volume status elevated. Increase torsemide to 60 mg twice a day.  - Check Myeloma Panel for possible Amyloidosis.  - Check  BMET, Myeloma Panel  Discussed low salt food choices. Discussed limiting fluid intake to < 2 liters per day. I have asked him to avoid Gatorade.   Plan to repeat Reds Vest this Thursday by  [facility] and BMET    Needs unna boots to bilateral lower extremities. Change weekly for 2 weeks.    2.Chronic Hypoxic Respiratory Failure/COPD On 2-2.5 liters chronically O2 sats stable.    3. CKD Stage III Creatinine baseline 1.4-1.6  BMET today.   4. HTN Stable.   5. Hypothyroidism  TSH 1.117.  6.  Bradycardia Cut back metoprolol  to 12.5 mg daily.  7. OSA  Continue CPAP nightly. Has repeat sleep study next month and may need Bipap. Followed closely by Dr Halford Chessman.    Follow up in 2 weeks. Will need Reds reading.   Greater than 50% of the 455 minute visit was spent in counseling/coordination of care regarding disease state education, salt/fluid restriction, sliding scale diuretics, and medication compliance.Discussed medication changes and EKG.    Follow up in 2 weeks.  Kaysie Michelini NP-C  1:42 PM

## 2018-08-16 ENCOUNTER — Telehealth (HOSPITAL_COMMUNITY): Payer: Self-pay | Admitting: Cardiology

## 2018-08-16 ENCOUNTER — Ambulatory Visit (HOSPITAL_COMMUNITY)
Admission: RE | Admit: 2018-08-16 | Discharge: 2018-08-16 | Disposition: A | Discharge status: Home / Self Care | Payer: Medicare Other | Source: Ambulatory Visit | Attending: Internal Medicine | Admitting: Internal Medicine

## 2018-08-16 VITALS — BP 130/74 | HR 56 | Wt 278.0 lb

## 2018-08-16 DIAGNOSIS — Z9989 Dependence on other enabling machines and devices: Secondary | ICD-10-CM

## 2018-08-16 DIAGNOSIS — N183 Chronic kidney disease, stage 3 unspecified: Secondary | ICD-10-CM

## 2018-08-16 DIAGNOSIS — I5032 Chronic diastolic (congestive) heart failure: Secondary | ICD-10-CM

## 2018-08-16 DIAGNOSIS — Z87891 Personal history of nicotine dependence: Secondary | ICD-10-CM | POA: Insufficient documentation

## 2018-08-16 DIAGNOSIS — Z9981 Dependence on supplemental oxygen: Secondary | ICD-10-CM | POA: Insufficient documentation

## 2018-08-16 DIAGNOSIS — Z79890 Hormone replacement therapy (postmenopausal): Secondary | ICD-10-CM | POA: Insufficient documentation

## 2018-08-16 DIAGNOSIS — E039 Hypothyroidism, unspecified: Secondary | ICD-10-CM | POA: Diagnosis not present

## 2018-08-16 DIAGNOSIS — Z7984 Long term (current) use of oral hypoglycemic drugs: Secondary | ICD-10-CM | POA: Diagnosis not present

## 2018-08-16 DIAGNOSIS — Z8249 Family history of ischemic heart disease and other diseases of the circulatory system: Secondary | ICD-10-CM | POA: Insufficient documentation

## 2018-08-16 DIAGNOSIS — Z7982 Long term (current) use of aspirin: Secondary | ICD-10-CM | POA: Diagnosis not present

## 2018-08-16 DIAGNOSIS — E1122 Type 2 diabetes mellitus with diabetic chronic kidney disease: Secondary | ICD-10-CM | POA: Diagnosis not present

## 2018-08-16 DIAGNOSIS — M4800 Spinal stenosis, site unspecified: Secondary | ICD-10-CM | POA: Insufficient documentation

## 2018-08-16 DIAGNOSIS — G2581 Restless legs syndrome: Secondary | ICD-10-CM | POA: Diagnosis not present

## 2018-08-16 DIAGNOSIS — I10 Essential (primary) hypertension: Secondary | ICD-10-CM

## 2018-08-16 DIAGNOSIS — J9611 Chronic respiratory failure with hypoxia: Secondary | ICD-10-CM | POA: Diagnosis not present

## 2018-08-16 DIAGNOSIS — I130 Hypertensive heart and chronic kidney disease with heart failure and stage 1 through stage 4 chronic kidney disease, or unspecified chronic kidney disease: Secondary | ICD-10-CM | POA: Diagnosis present

## 2018-08-16 DIAGNOSIS — Z79899 Other long term (current) drug therapy: Secondary | ICD-10-CM | POA: Diagnosis not present

## 2018-08-16 DIAGNOSIS — R001 Bradycardia, unspecified: Secondary | ICD-10-CM | POA: Insufficient documentation

## 2018-08-16 DIAGNOSIS — G4733 Obstructive sleep apnea (adult) (pediatric): Secondary | ICD-10-CM | POA: Diagnosis not present

## 2018-08-16 DIAGNOSIS — J449 Chronic obstructive pulmonary disease, unspecified: Secondary | ICD-10-CM | POA: Diagnosis not present

## 2018-08-16 LAB — BASIC METABOLIC PANEL
Anion gap: 7 (ref 5–15)
BUN: 37 mg/dL — AB (ref 8–23)
CHLORIDE: 103 mmol/L (ref 98–111)
CO2: 33 mmol/L — AB (ref 22–32)
Calcium: 8.8 mg/dL — ABNORMAL LOW (ref 8.9–10.3)
Creatinine, Ser: 1.32 mg/dL — ABNORMAL HIGH (ref 0.61–1.24)
GFR calc Af Amer: >60 mL/min (ref >60)
GFR calc non Af Amer: 52 mL/min — ABNORMAL LOW (ref >60)
GLUCOSE: 165 mg/dL — AB (ref 70–99)
Potassium: 3.8 mmol/L (ref 3.5–5.1)
Sodium: 143 mmol/L (ref 135–145)

## 2018-08-16 LAB — BRAIN NATRIURETIC PEPTIDE: B Natriuretic Peptide: 106.5 pg/mL — ABNORMAL HIGH (ref 0.0–100.0)

## 2018-08-16 MED ORDER — METOPROLOL TARTRATE 25 MG PO TABS: 12.5000 mg | ORAL_TABLET | Freq: Every day | ORAL | 3 refills | Status: DC

## 2018-08-16 MED ORDER — POTASSIUM CHLORIDE CRYS ER 20 MEQ PO TBCR: 40.0000 meq | EXTENDED_RELEASE_TABLET | Freq: Two times a day (BID) | ORAL | 1 refills | Status: DC

## 2018-08-16 MED ORDER — TORSEMIDE 20 MG PO TABS: 60.0000 mg | ORAL_TABLET | Freq: Two times a day (BID) | ORAL | 11 refills | Status: DC

## 2018-08-16 NOTE — Patient Instructions (Addendum)
INCREASE Torsemide to 60 mg (3 tabs) twice daily every day.  DECREASE Metoprolol to 12.5 mg 91/2 tablet) ONCE daily.  Routine lab work today. Will notify you of abnormal results, otherwise no news is good news!  Will set up home health diuretic protocol with unna boots through [facility]. Their office will contact you to set up initial visit.  Follow up 2 weeks.  _____________________________________________________________________________ Marin Roberts Code:   Take all medication as prescribed the day of your appointment. Bring all medications with you to your appointment.  Do the following things EVERYDAY: 1) Weigh yourself in the morning before breakfast. Write it down and keep it in a log. 2) Take your medicines as prescribed 3) Eat low salt foods-Limit salt (sodium) to 2000 mg per day.  4) Stay as active as you can everyday 5) Limit all fluids for the day to less than 2 liters

## 2018-08-16 NOTE — Telephone Encounter (Signed)
Notes recorded by Kerry Dory, CMA on 08/16/2018 at 4:02 PM EDT Patient aware. Patient voiced understanding   ------  Notes recorded by Conrad [place], NP on 08/16/2018 at 3:57 PM EDT EKG Sinus Loletha Grayer with medication adjustments. ------  Notes recorded by Conrad [place], NP on 08/16/2018 at 1:52 PM EDT Please call. Increase potassium to 40 meq twice a day. Renal function stable.

## 2018-08-18 LAB — MULTIPLE MYELOMA PANEL, SERUM
ALPHA 1: 0.2 g/dL (ref 0.0–0.4)
ALPHA2 GLOB SERPL ELPH-MCNC: 0.9 g/dL (ref 0.4–1.0)
Albumin SerPl Elph-Mcnc: 3.6 g/dL (ref 2.9–4.4)
Albumin/Glob SerPl: 1.1 (ref 0.7–1.7)
B-Globulin SerPl Elph-Mcnc: 0.9 g/dL (ref 0.7–1.3)
Gamma Glob SerPl Elph-Mcnc: 1.3 g/dL (ref 0.4–1.8)
Globulin, Total: 3.3 g/dL (ref 2.2–3.9)
IGA: 316 mg/dL (ref 61–437)
IGG (IMMUNOGLOBIN G), SERUM: 1376 mg/dL (ref 700–1600)
IgM (Immunoglobulin M), Srm: 98 mg/dL (ref 15–143)
TOTAL PROTEIN ELP: 6.9 g/dL (ref 6.0–8.5)

## 2018-08-26 NOTE — Progress Notes (Signed)
ADVANCED HF CLINIC NOTE  Referring Physician: Richardson Dopp PA Primary Care: Dr Cynda Familia  Primary Cardiologist: Dr Acie Fredrickson  Pulmonary: Dr Halford Chessman HF MD: Dr [name]  HPI: Travis Travis [name] is a 72 year old referred by Richardson Dopp PA for consultation for heart failure.   Travis [name] is a 72 year old with a history of diastolic heart failure, HTN, hypothyroidism, OSA, DMII, COPD on chronic oxygen, and spinal stenosis.   Admitted 04/08/2018 for decompression lumber laminectomy. Hospital course was complicated by respiratory failure and severe deconditioning. He was discharged to [facility] and went home on 05/12/2018.   Admitted 7/1 through 07/04/18. Admitted with AMS due to CO2 retention/hypercarbic respiratory failure due to CO2 retention and A/C diastolic heart failure. BB decreased in the hospital due to bradycardic. Discharge weight 275 pounds.   He presents today for HF follow up. Last visit torsemide was increased for volume overload. Coreg was cut back with sinus brady. Today he is doing better. SOB is about the same. He is SOB with exertion. Sleeps sitting up in hospital bed. Wearing CPAP qHS. Wears 2 L O2 at all times. Edema is much improved, but he still feels bloated. No cough. No CP or dizziness. Taking all medications. Did not get labs or vest rechecked by [facility]. He is now able to weigh at home and is getting 272-276 lbs. No longer drinking gatorade. Limiting fluid and salt intake. Weight is down 3 lbs on our scale. It looks like [facility] is actually who follows him at home.  Cardiac Testing  Echocardiogram 06/29/2018 EF 55-60, normal wall motion, grade 1 diastolic dysfunction Echocardiogram 03/06/17 Mild LVH, EF 65-70, normal wall motion, grade 2 diastolic dysfunction, mild LAE Echo 08/21/15 EF 55-60, normal wall motion, grade 2 diastolic dysfunction, mild LAE  R/L cardiac catheterization 09/07/15 LM 40 LAD distal 40 LCx ostial 50 RCA proximal 40 EF 55-60 LVEDP 16, mean RA 4, PASP 45, mean PCWP  13  Nuclear stress test 03/19/15 EF 48, no ischemia, low risk  Review of systems complete and found to be negative unless listed in HPI.   Past Medical History:  Diagnosis Date  . Cervical myelopathy (HCC)    with myelomalacia at C5-6 and C3-4  . CHF (congestive heart failure) ([place])   . Constipation - functional    controlled with miralax  . COPD (chronic obstructive pulmonary disease) (HCC)    mild  . DJD (degenerative joint disease)    WHOLE SPINE  . Dyspnea   . Gait disorder   . Heart murmur    years ago   . Hypertension   . Hypothyroidism    Low d/t treatment of hyperthyroidism  . Lumbosacral spinal stenosis 03/13/2014  . On home oxygen therapy    "2.5L all the time" (06/29/2018)  . Peripheral neuropathy   . Pneumonia   . PONV (postoperative nausea and vomiting)   . RBBB   . Renal disorder    acute kidney failure  . Restless legs syndrome 03/14/2015  . RLS (restless legs syndrome)   . Sleep apnea    DX  2009/2010 [facility] done at [facility]  . 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 Type II diabetes mellitus (HCC)    diet controlled    Current Outpatient Medications  Medication Sig Dispense Refill  . aspirin EC 81 MG EC tablet Take 1 tablet (81 mg total) by mouth daily. 30 tablet 0  . escitalopram (LEXAPRO) 10 MG tablet Take 1 tablet (10 mg total)  by mouth daily. 30 tablet 2  . gabapentin (NEURONTIN) 400 MG capsule TAKE 1 CAPSULE (400 MG TOTAL) BY MOUTH EVERY 8 (EIGHT) HOURS. 90 capsule 0  . glimepiride (AMARYL) 2 MG tablet Take 1 tablet (2 mg total) by mouth daily with breakfast. 30 tablet 1  . hydrALAZINE (APRESOLINE) 50 MG tablet Take 1.5 tablets (75 mg total) by mouth 3 (three) times daily. 378 tablet 3  . levothyroxine (SYNTHROID, LEVOTHROID) 175 MCG tablet Take 1 tablet (175 mcg total) by mouth daily. 30 tablet 2  . metoprolol tartrate (LOPRESSOR) 25 MG tablet Take 0.5 tablets (12.5 mg total) by mouth daily. 45 tablet 3  . polyethylene glycol (MIRALAX / GLYCOLAX)  packet Take 17 g by mouth daily as needed. For constipation 14 each 0  . potassium chloride SA (K-DUR,KLOR-CON) 20 MEQ tablet Take 2 tablets (40 mEq total) by mouth 2 (two) times daily. 120 tablet 1  . pramipexole (MIRAPEX) 0.5 MG tablet Take 1 tablet (0.5 mg total) by mouth 2 (two) times daily. 60 tablet 0  . senna (SENOKOT) 8.6 MG TABS tablet Take 2 tablets (17.2 mg total) by mouth 2 (two) times daily. 120 each 0  . tamsulosin (FLOMAX) 0.4 MG CAPS capsule Take 1 capsule (0.4 mg total) by mouth daily. 30 capsule 0  . torsemide (DEMADEX) 20 MG tablet Take 3 tablets (60 mg total) by mouth 2 (two) times daily. 60 tablet 11  . TOVIAZ 8 MG TB24 tablet Take 1 tablet (8 mg total) by mouth daily. 30 tablet 11   No current facility-administered medications for this encounter.     Allergies  Allergen Reactions  . Other Other (See Comments)    ANESTHESIA UNSPECIFIED  -- makes sick, must have antinausea medication in advance.  . Levofloxacin In D5w Itching    Possible infusion reaction 03/29/17. Tolerating oral Levaquin 03/30/17      Social History   Socioeconomic History  . Marital status: Married    Spouse name: carolyn  . Number of children: 3  . Years of education: 64  . Highest education level: Not on file  Occupational History    Employer: [employer]: Works in [place]  . Financial resource strain: Not on file  . Food insecurity:    Worry: Not on file    Inability: Not on file  . Transportation needs:    Medical: Not on file    Non-medical: Not on file  Tobacco Use  . Smoking status: Former Smoker    Packs/day: 2.00    Years: 20.00    Pack years: 40.00    Types: Cigarettes    Last attempt to quit: 06/28/1985    Years since quitting: 33.1  . Smokeless tobacco: Former Systems developer    Quit date: 06/28/1985  Substance and Sexual Activity  . Alcohol use: No  . Drug use: No  . Sexual activity: Not on file  Lifestyle  . Physical activity:     Days per week: Not on file    Minutes per session: Not on file  . Stress: Not on file  Relationships  . Social connections:    Talks on phone: Not on file    Gets together: Not on file    Attends religious service: Not on file    Active member of club or organization: Not on file    Attends meetings of clubs or organizations: Not on file    Relationship status: Not on file  .  Intimate partner violence:    Fear of current or ex partner: Not on file    Emotionally abused: Not on file    Physically abused: Not on file    Forced sexual activity: Not on file  Other Topics Concern  . Not on file  Social History Narrative   Lives with wife,  [name]   Patient is married with 3 children.   Patient has 16 yrs of education.   Patient is right handed.   Patient drinks 2 cups of caffeine per day.      Family History  Problem Relation Age of Onset  . Polycythemia Mother   . Diabetes Father   . Hypertension Father   . Heart disease Father   . CAD Unknown        1 of his 4 brothers and father has CAD    Vitals:   08/31/18 0906  BP: (!) 144/72  Pulse: (!) 55  SpO2: 93%  Weight: 124.7 kg (275 lb)   Wt Readings from Last 3 Encounters:  08/31/18 124.7 kg (275 lb)  08/16/18 126.1 kg (278 lb)  08/11/18 121.1 kg (267 lb)    PHYSICAL EXAM: General: Appears chronically ill. No resp difficulty. Arrived in wheelchair.  HEENT: Normal Neck: Supple. JVP ~8. Carotids 2+ bilat; no bruits. No thyromegaly or nodule noted. Cor: PMI nondisplaced. RRR, No M/G/R noted Lungs: CTAB, normal effort. On 2 L [place] Abdomen: Soft, non-tender, +distended, no HSM. No bruits or masses. +BS  Extremities: No cyanosis, clubbing, or rash. R and LLE no edema. BLE unna boots.  Neuro: Alert & orientedx3, cranial nerves grossly intact. moves all 4 extremities w/o difficulty. Affect pleasant   ASSESSMENT & PLAN:  1. Chronic Diastolic Heart Failure ECHO 06/29/2018 EF 55-60% Grade IDD  - Volume status improving,  but still mildly elevated. ReDS Reading improved from 42% to 38% - NYHA III. Continue torsemide to 60 mg twice a day. BMET today. He does not want to increase torsemide again without knowing how his kidneys are - Start spiro 12.5 mg daily. Check BMET 7-10 days with [facility] - Myeloma panel with no M-spike.  - Discussed low salt food choices. Discussed limiting fluid intake to < 2 liters per day.   - Continue unna boots through [facility].   2.Chronic Hypoxic Respiratory Failure/COPD On 2-2.5 liters chronically O2 sats stable.  No change.   3. CKD Stage III Creatinine baseline 1.4-1.6  BMET today  4. HTN - Elevated today, but has not taken medications.  - SBP runs 130s at home. Start spiro 12.5 mg daily as above.  5. Hypothyroidism  TSH 1.117.  6. Bradycardia Improved with decreased dose of Toprol.   7. OSA - Continue CPAP nightly. Has repeat sleep study this month and may need Bipap. Followed closely by Dr Halford Chessman.    BMET today Start spiro 12.5 mg daily BMET in 7-10 days with [facility] Follow up in 3 weeks with APP Follow up in 8 weeks with Dr [name].   Georgiana Shore NP-C  9:09 AM   Greater than 50% of the 25 minute visit was spent in counseling/coordination of care regarding disease state education, salt/fluid restriction, sliding scale diuretics, and medication compliance.

## 2018-08-31 ENCOUNTER — Ambulatory Visit (HOSPITAL_COMMUNITY)
Admission: RE | Admit: 2018-08-31 | Discharge: 2018-08-31 | Disposition: A | Discharge status: Home / Self Care | Payer: Medicare Other | Source: Ambulatory Visit | Attending: Cardiology | Admitting: Cardiology

## 2018-08-31 ENCOUNTER — Encounter (HOSPITAL_COMMUNITY): Payer: Self-pay

## 2018-08-31 VITALS — BP 144/72 | HR 55 | Wt 275.0 lb

## 2018-08-31 DIAGNOSIS — E1122 Type 2 diabetes mellitus with diabetic chronic kidney disease: Secondary | ICD-10-CM | POA: Insufficient documentation

## 2018-08-31 DIAGNOSIS — Z7984 Long term (current) use of oral hypoglycemic drugs: Secondary | ICD-10-CM | POA: Insufficient documentation

## 2018-08-31 DIAGNOSIS — Z833 Family history of diabetes mellitus: Secondary | ICD-10-CM | POA: Insufficient documentation

## 2018-08-31 DIAGNOSIS — N183 Chronic kidney disease, stage 3 unspecified: Secondary | ICD-10-CM

## 2018-08-31 DIAGNOSIS — G4733 Obstructive sleep apnea (adult) (pediatric): Secondary | ICD-10-CM | POA: Diagnosis not present

## 2018-08-31 DIAGNOSIS — E039 Hypothyroidism, unspecified: Secondary | ICD-10-CM | POA: Diagnosis not present

## 2018-08-31 DIAGNOSIS — I5032 Chronic diastolic (congestive) heart failure: Secondary | ICD-10-CM | POA: Diagnosis not present

## 2018-08-31 DIAGNOSIS — Z79899 Other long term (current) drug therapy: Secondary | ICD-10-CM | POA: Diagnosis not present

## 2018-08-31 DIAGNOSIS — Z7982 Long term (current) use of aspirin: Secondary | ICD-10-CM | POA: Diagnosis not present

## 2018-08-31 DIAGNOSIS — R001 Bradycardia, unspecified: Secondary | ICD-10-CM

## 2018-08-31 DIAGNOSIS — Z8249 Family history of ischemic heart disease and other diseases of the circulatory system: Secondary | ICD-10-CM | POA: Insufficient documentation

## 2018-08-31 DIAGNOSIS — G4730 Sleep apnea, unspecified: Secondary | ICD-10-CM | POA: Diagnosis not present

## 2018-08-31 DIAGNOSIS — Z884 Allergy status to anesthetic agent status: Secondary | ICD-10-CM | POA: Insufficient documentation

## 2018-08-31 DIAGNOSIS — Z87891 Personal history of nicotine dependence: Secondary | ICD-10-CM | POA: Insufficient documentation

## 2018-08-31 DIAGNOSIS — I10 Essential (primary) hypertension: Secondary | ICD-10-CM

## 2018-08-31 DIAGNOSIS — G2581 Restless legs syndrome: Secondary | ICD-10-CM | POA: Diagnosis not present

## 2018-08-31 DIAGNOSIS — Z79890 Hormone replacement therapy (postmenopausal): Secondary | ICD-10-CM | POA: Diagnosis not present

## 2018-08-31 DIAGNOSIS — J9611 Chronic respiratory failure with hypoxia: Secondary | ICD-10-CM | POA: Diagnosis not present

## 2018-08-31 DIAGNOSIS — E1140 Type 2 diabetes mellitus with diabetic neuropathy, unspecified: Secondary | ICD-10-CM | POA: Insufficient documentation

## 2018-08-31 DIAGNOSIS — I130 Hypertensive heart and chronic kidney disease with heart failure and stage 1 through stage 4 chronic kidney disease, or unspecified chronic kidney disease: Secondary | ICD-10-CM | POA: Diagnosis not present

## 2018-08-31 DIAGNOSIS — Z832 Family history of diseases of the blood and blood-forming organs and certain disorders involving the immune mechanism: Secondary | ICD-10-CM | POA: Diagnosis not present

## 2018-08-31 DIAGNOSIS — J449 Chronic obstructive pulmonary disease, unspecified: Secondary | ICD-10-CM | POA: Insufficient documentation

## 2018-08-31 DIAGNOSIS — Z9981 Dependence on supplemental oxygen: Secondary | ICD-10-CM | POA: Insufficient documentation

## 2018-08-31 DIAGNOSIS — Z9989 Dependence on other enabling machines and devices: Secondary | ICD-10-CM

## 2018-08-31 LAB — BASIC METABOLIC PANEL
ANION GAP: 11 (ref 5–15)
BUN: 31 mg/dL — ABNORMAL HIGH (ref 8–23)
CO2: 35 mmol/L — ABNORMAL HIGH (ref 22–32)
Calcium: 9.4 mg/dL (ref 8.9–10.3)
Chloride: 99 mmol/L (ref 98–111)
Creatinine, Ser: 1.27 mg/dL — ABNORMAL HIGH (ref 0.61–1.24)
GFR calc Af Amer: >60 mL/min (ref >60)
GFR calc non Af Amer: 54 mL/min — ABNORMAL LOW (ref >60)
GLUCOSE: 149 mg/dL — AB (ref 70–99)
Potassium: 4.1 mmol/L (ref 3.5–5.1)
Sodium: 145 mmol/L (ref 135–145)

## 2018-08-31 MED ORDER — SPIRONOLACTONE 25 MG PO TABS: 12.5000 mg | ORAL_TABLET | Freq: Every day | ORAL | 3 refills | Status: DC

## 2018-08-31 MED ORDER — TORSEMIDE 20 MG PO TABS: 60.0000 mg | ORAL_TABLET | Freq: Two times a day (BID) | ORAL | 11 refills | Status: DC

## 2018-08-31 NOTE — Patient Instructions (Signed)
Labs today (will call for abnormal results, otherwise no news is good news)  START taking Spironolactone 12.5 mg Once Daily  [facility] will draw labs and do Vest reading in 7-10 days.  Follow up in [facility] in 3 weeks  Follow up with Dr. [name] in 8 weeks.

## 2018-09-01 ENCOUNTER — Telehealth (HOSPITAL_COMMUNITY): Payer: Self-pay | Admitting: Cardiology

## 2018-09-01 MED ORDER — POTASSIUM CHLORIDE CRYS ER 20 MEQ PO TBCR: EXTENDED_RELEASE_TABLET | ORAL | 1 refills | Status: DC

## 2018-09-01 NOTE — Telephone Encounter (Signed)
Notes recorded by Kerry Dory, CMA on 09/01/2018 at 4:11 PM EDT Patient aware. Patient voiced understanding   ------  Notes recorded by Georgiana Shore, NP on 08/31/2018 at 12:11 PM EDT Please call and let him know that his kidney function is stable. He should start spiro today. Potassium is 4.1. With addition of spiro today, he can decrease potassium from 40 meq BID to 40 meq in am, 20 meq in pm. We will have [facility] recheck labs in 1 week (already ordered). Thanks

## 2018-09-02 ENCOUNTER — Other Ambulatory Visit (HOSPITAL_COMMUNITY): Payer: Self-pay | Admitting: *Deleted

## 2018-09-02 MED ORDER — TORSEMIDE 20 MG PO TABS: 60.0000 mg | ORAL_TABLET | Freq: Two times a day (BID) | ORAL | 11 refills | Status: DC

## 2018-09-03 ENCOUNTER — Other Ambulatory Visit (HOSPITAL_COMMUNITY): Payer: Self-pay

## 2018-09-03 MED ORDER — TORSEMIDE 20 MG PO TABS: 60.0000 mg | ORAL_TABLET | Freq: Two times a day (BID) | ORAL | 11 refills | Status: DC

## 2018-09-06 ENCOUNTER — Other Ambulatory Visit: Payer: Self-pay | Admitting: Physical Medicine & Rehabilitation

## 2018-09-14 ENCOUNTER — Ambulatory Visit (HOSPITAL_BASED_OUTPATIENT_CLINIC_OR_DEPARTMENT_OTHER): Payer: Medicare Other | Attending: Pulmonary Disease | Admitting: Pulmonary Disease

## 2018-09-14 VITALS — Ht 73.0 in | Wt 278.0 lb

## 2018-09-14 DIAGNOSIS — E662 Morbid (severe) obesity with alveolar hypoventilation: Secondary | ICD-10-CM | POA: Insufficient documentation

## 2018-09-14 DIAGNOSIS — J984 Other disorders of lung: Secondary | ICD-10-CM | POA: Diagnosis not present

## 2018-09-14 DIAGNOSIS — G4733 Obstructive sleep apnea (adult) (pediatric): Secondary | ICD-10-CM

## 2018-09-14 DIAGNOSIS — J9611 Chronic respiratory failure with hypoxia: Secondary | ICD-10-CM | POA: Diagnosis not present

## 2018-09-14 DIAGNOSIS — J986 Disorders of diaphragm: Secondary | ICD-10-CM | POA: Insufficient documentation

## 2018-09-14 DIAGNOSIS — J9612 Chronic respiratory failure with hypercapnia: Secondary | ICD-10-CM | POA: Diagnosis not present

## 2018-09-14 DIAGNOSIS — Z6837 Body mass index (BMI) 37.0-37.9, adult: Secondary | ICD-10-CM | POA: Diagnosis not present

## 2018-09-19 ENCOUNTER — Telehealth: Payer: Self-pay | Admitting: Pulmonary Disease

## 2018-09-19 DIAGNOSIS — G4733 Obstructive sleep apnea (adult) (pediatric): Secondary | ICD-10-CM

## 2018-09-19 NOTE — Procedures (Signed)
   Patient Name: Travis [name], Travis [name] Date: 09/14/2018   Gender: Male  D.O.B: [birth year]  Age (years): 34  Referring Provider: [name] MD, ABSM  Height (inches): 73  Interpreting Physician: [name] MD, ABSM  Weight (lbs): 278  RPSGT: Laren Everts  BMI: 37  MRN: [id number]  Neck Size: 19.50  <br> <br>  CLINICAL INFORMATION  He has sleep disordered breathing with chronic hypoxic and hypercapnic respiratory failure. ?These are related to obstructive sleep apnea, restrictive lung disease, right diaphragm dysfunction, and obesity hypoventilation syndrome. ?He has been on CPAP, but there is concern he would be better served with Bipap. ?He was also started on supplemental oxygen since his hospital recent stay. SLEEP STUDY TECHNIQUE  As per the AASM Manual for the Scoring of Sleep and Associated Events v2.3 (April 2016) with a hypopnea requiring 4% desaturations. The channels recorded and monitored were frontal, central and occipital EEG, electrooculogram (EOG), submentalis EMG (chin), nasal and oral airflow, thoracic and abdominal wall motion, anterior tibialis EMG, snore microphone, electrocardiogram, and pulse oximetry. Bilevel positive airway pressure (BPAP) was initiated at the beginning of the study and titrated to treat sleep-disordered breathing. MEDICATIONS  Medications self-administered by patient taken the night of the study : [name] RESPIRATORY PARAMETERS  Optimal IPAP Pressure (cm):  20 AHI at Optimal Pressure (/hr) 2.0  Optimal EPAP Pressure (cm): 16       Overall Minimal O2 (%): 74.0 Minimal O2 at Optimal Pressure (%): 84.0   He was started on CPAP and increased to pressure of 15 cm H2O. He had persistent obstructive events with oxygen desaturation. He was transitioned to Bipap with improvement in obstructive events. He continued to have oxygen desaturation that lasted for more than 5 minutes. He had supplemental oxygen added, and this was increased to 3 liters  oxygen.  SLEEP ARCHITECTURE  Start Time: 10:44:54 PM Stop Time: 5:13:04 AM Total Time (min): 388.2 Total Sleep Time (min): 328  Sleep Latency (min): 2.7 Sleep Efficiency (%): 84.5% REM Latency (min): 350.5 WASO (min): 57.5  Stage N1 (%): 19.4% Stage N2 (%): 70.4% Stage N3 (%): 0.0% Stage R (%): 10.2  Supine (%): 100.00 Arousal Index (/hr): 29.5          CARDIAC DATA  The 2 lead EKG demonstrated sinus rhythm. The mean heart rate was 58.8 beats per minute. Other EKG findings include: PVCs.  LEG MOVEMENT DATA  The total Periodic Limb Movements of Sleep (PLMS) were 0. The PLMS index was 0.0. A PLMS index of <15 is considered normal in adults. IMPRESSIONS  - He was tried on CPAP. He had persistent obstructive events and oxygen desaturation. He failed CPAP titration. - He was transitioned to Bipap. He did best with Bipap 20/16 cm H2O. - He had persistent oxygen desaturation after correcting for obstructive respiratory events with Bipap. He had 3 liters supplemental oxygen added with Bipap, and had improvement in his oxygenation. DIAGNOSIS  - Obstructive Sleep Apnea  - Chronic Hypoxic and Hypercapnic Respiratory Failure - Obesity Hypoventilation Syndrome - Right Diaphragm Dysfunction - Restrictive Lung Disease RECOMMENDATIONS  - Trial of BiPAP therapy on 20/16 cm H2O with a Large size [employer] Respironics Full Face Mask Dreamwear mask and heated humidification. He should have 3 liters oxygen added into Bipap. [Electronically signed] 09/19/2018 10:55 AM [name] MD, ABSM  Diplomate, American Board of Sleep Medicine  NPI: [id number]

## 2018-09-19 NOTE — Telephone Encounter (Signed)
Bipap 09/14/18 >> failed CPAP; Bipap 20/16 cm H2O + 3 liters O2 >> AHI 2, +R.   Please let him know that sleep study shows better set up for him is Bipap 20/16 cm H2O with 3 liters oxygen.  Please arrange for Bipap 20/16 cm H2O with heated humidity and mask of choice with 3 liters oxygen at night.  Please schedule ROV 2 months after getting Bipap >> he is scheduled for ROV on 10/08/18, and this can be rescheduled.

## 2018-09-20 ENCOUNTER — Ambulatory Visit: Payer: Medicare Other | Admitting: Nurse Practitioner

## 2018-09-20 NOTE — Telephone Encounter (Signed)
Attempted to call patient today regarding results. I did not receive an answer at time of call. I have left a voicemail message for pt to return call. X1  

## 2018-09-20 NOTE — Progress Notes (Signed)
ADVANCED HF CLINIC NOTE  Referring Physician: Richardson Dopp PA Primary Care: Dr Cynda Familia  Primary Cardiologist: Dr Acie Fredrickson  Pulmonary: Dr Halford Chessman HF MD: Dr [name]  HPI: Travis [name] is a 72 year old referred by Richardson Dopp PA for consultation for heart failure.   Travis [name] is a 71 year old with a history of diastolic heart failure, HTN, hypothyroidism, OSA, DMII, COPD on chronic oxygen, and spinal stenosis.   Admitted 04/08/2018 for decompression lumber laminectomy. Hospital course was complicated by respiratory failure and severe deconditioning. He was discharged to [facility] and went home on 05/12/2018.   Admitted 7/1 through 07/04/18. Admitted with AMS due to CO2 retention/hypercarbic respiratory failure due to CO2 retention and A/C diastolic heart failure. BB decreased in the hospital due to bradycardic. Discharge weight 275 pounds.  He returns today for HF follow up. Last visit spiro was started. Overall doing well. Getting stronger working with [facility] PT[facility] ([facility]). Denies SOB other than when he is off O2. Able to walk around house with walker with no CP or SOB. Uses electric scooter a lot of the time. Sleeps sitting up in hospital bed. BLE edema much improved, no longer wearing unna boots. Wearing CPAP qHS. Had a repeat sleep study recently (looks like Dr Halford Chessman plans to switch to BiPAP). Has some dizziness in the morning, but none with standing. Feels tired during the day. Weights stable ~275 lbs, down 4 more lbs on our scale. SBP 120-140 when checked by [facility].   Cardiac Testing  Echocardiogram 06/29/2018 EF 55-60, normal wall motion, grade 1 diastolic dysfunction Echocardiogram 03/06/17 Mild LVH, EF 65-70, normal wall motion, grade 2 diastolic dysfunction, mild LAE Echo 08/21/15 EF 55-60, normal wall motion, grade 2 diastolic dysfunction, mild LAE  R/L cardiac catheterization 09/07/15 LM 40 LAD distal 40 LCx ostial 50 RCA proximal 40 EF 55-60 LVEDP 16, mean RA 4, PASP 45, mean PCWP 13  Nuclear  stress test 03/19/15 EF 48, no ischemia, low risk  Review of systems complete and found to be negative unless listed in HPI.   Past Medical History:  Diagnosis Date  . Cervical myelopathy (HCC)    with myelomalacia at C5-6 and C3-4  . CHF (congestive heart failure) ([place])   . Constipation - functional    controlled with miralax  . COPD (chronic obstructive pulmonary disease) (HCC)    mild  . DJD (degenerative joint disease)    WHOLE SPINE  . Dyspnea   . Gait disorder   . Heart murmur    years ago   . Hypertension   . Hypothyroidism    Low d/t treatment of hyperthyroidism  . Lumbosacral spinal stenosis 03/13/2014  . On home oxygen therapy    "2.5L all the time" (06/29/2018)  . Peripheral neuropathy   . Pneumonia   . PONV (postoperative nausea and vomiting)   . RBBB   . Renal disorder    acute kidney failure  . Restless legs syndrome 03/14/2015  . RLS (restless legs syndrome)   . Sleep apnea    DX  2009/2010 study done at [facility]  . 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 Type II diabetes mellitus (HCC)    diet controlled    Current Outpatient Medications  Medication Sig Dispense Refill  . aspirin EC 81 MG EC tablet Take 1 tablet (81 mg total) by mouth daily. 30 tablet 0  . escitalopram (LEXAPRO) 10 MG tablet Take 1 tablet (10 mg total) by mouth daily. 30 tablet  2  . gabapentin (NEURONTIN) 400 MG capsule TAKE 1 CAPSULE (400 MG TOTAL) BY MOUTH EVERY 8 (EIGHT) HOURS. 90 capsule 0  . glimepiride (AMARYL) 2 MG tablet Take 1 tablet (2 mg total) by mouth daily with breakfast. 30 tablet 1  . hydrALAZINE (APRESOLINE) 50 MG tablet Take 1.5 tablets (75 mg total) by mouth 3 (three) times daily. 378 tablet 3  . levothyroxine (SYNTHROID, LEVOTHROID) 175 MCG tablet Take 1 tablet (175 mcg total) by mouth daily. 30 tablet 2  . metoprolol tartrate (LOPRESSOR) 25 MG tablet Take 0.5 tablets (12.5 mg total) by mouth daily. 45 tablet 3  . polyethylene glycol (MIRALAX / GLYCOLAX) packet Take 17 g  by mouth daily as needed. For constipation 14 each 0  . potassium chloride SA (K-DUR,KLOR-CON) 20 MEQ tablet Take 2 tablets (40 mEq total) by mouth every morning AND 1 tablet (20 mEq total) every evening. 120 tablet 1  . pramipexole (MIRAPEX) 0.5 MG tablet Take 1 tablet (0.5 mg total) by mouth 2 (two) times daily. 60 tablet 0  . senna (SENOKOT) 8.6 MG TABS tablet Take 2 tablets (17.2 mg total) by mouth 2 (two) times daily. 120 each 0  . spironolactone (ALDACTONE) 25 MG tablet Take 0.5 tablets (12.5 mg total) by mouth daily. 45 tablet 3  . tamsulosin (FLOMAX) 0.4 MG CAPS capsule Take 1 capsule (0.4 mg total) by mouth daily. 30 capsule 0  . torsemide (DEMADEX) 20 MG tablet Take 3 tablets (60 mg total) by mouth 2 (two) times daily. 180 tablet 11  . TOVIAZ 8 MG TB24 tablet Take 1 tablet (8 mg total) by mouth daily. 30 tablet 11   No current facility-administered medications for this encounter.     Allergies  Allergen Reactions  . Other Other (See Comments)    ANESTHESIA UNSPECIFIED  -- makes sick, must have antinausea medication in advance.  . Levofloxacin In D5w Itching    Possible infusion reaction 03/29/17. Tolerating oral Levaquin 03/30/17      Social History   Socioeconomic History  . Marital status: Married    Spouse name: carolyn  . Number of children: 3  . Years of education: 43  . Highest education level: Not on file  Occupational History    Employer: [employer]: Works in [place]  . Financial resource strain: Not on file  . Food insecurity:    Worry: Not on file    Inability: Not on file  . Transportation needs:    Medical: Not on file    Non-medical: Not on file  Tobacco Use  . Smoking status: Former Smoker    Packs/day: 2.00    Years: 20.00    Pack years: 40.00    Types: Cigarettes    Last attempt to quit: 06/28/1985    Years since quitting: 33.2  . Smokeless tobacco: Former Systems developer    Quit date: 06/28/1985  Substance  and Sexual Activity  . Alcohol use: No  . Drug use: No  . Sexual activity: Not on file  Lifestyle  . Physical activity:    Days per week: Not on file    Minutes per session: Not on file  . Stress: Not on file  Relationships  . Social connections:    Talks on phone: Not on file    Gets together: Not on file    Attends religious service: Not on file    Active member of club or organization: Not on file  Attends meetings of clubs or organizations: Not on file    Relationship status: Not on file  . Intimate partner violence:    Fear of current or ex partner: Not on file    Emotionally abused: Not on file    Physically abused: Not on file    Forced sexual activity: Not on file  Other Topics Concern  . Not on file  Social History Narrative   Lives with wife,  [name]   Patient is married with 3 children.   Patient has 16 yrs of education.   Patient is right handed.   Patient drinks 2 cups of caffeine per day.      Family History  Problem Relation Age of Onset  . Polycythemia Mother   . Diabetes Father   . Hypertension Father   . Heart disease Father   . CAD Unknown        1 of his 4 brothers and father has CAD    Vitals:   09/21/18 0905  BP: (!) 162/70  Pulse: (!) 56  Resp: 18  SpO2: 92%  Weight: 123.2 kg (271 lb 9.6 oz)   Wt Readings from Last 3 Encounters:  09/21/18 123.2 kg (271 lb 9.6 oz)  09/14/18 126.1 kg (278 lb)  08/31/18 124.7 kg (275 lb)    PHYSICAL EXAM: General: Appears chronically ill. No resp difficulty. Arrived in wheelchair HEENT: Normal Neck: Supple. JVP 6-7. Carotids 2+ bilat; no bruits. No thyromegaly or nodule noted. Cor: PMI nondisplaced. RRR, No M/G/R noted Lungs: CTAB, normal effort. On 2 L [place] Abdomen: obese, soft, non-tender, non-distended, no HSM. No bruits or masses. +BS  Extremities: No cyanosis, clubbing, or rash. R and LLE 1+ ankle edema.  Neuro: Alert & orientedx3, cranial nerves grossly intact. moves all 4 extremities w/o  difficulty. Affect pleasant   ASSESSMENT & PLAN:  1. Chronic Diastolic Heart Failure ECHO 06/29/2018 EF 55-60% Grade IDD  - Myeloma panel with no M-spike, normal IFE.  - NYHA III, difficult to assess with inactivity.  - Volume status stable on exam. Weight down 4 more lbs. - Continue torsemide 60 mg twice a day.  - On spiro 12.5 mg daily. BMET today. If creatinine and K stable, will increase spiro to 25 mg daily today. - Discussed low salt food choices. Discussed limiting fluid intake to < 2 liters per day.    2.Chronic Hypoxic Respiratory Failure/COPD On 2-2.5 liters chronically O2 sats stable. On CPAP qHS. Per Dr Juanetta Gosling note, plan to switch to BiPAP.  3. CKD Stage III Creatinine baseline 1.4-1.6  BMET today  4. HTN - Elevated today. SBP 120-140 when HH checks. Increase spiro as above as long as BMET stable.   5. Hypothyroidism  - Per PCP.  6. Bradycardia Improved with decreased dose of Toprol. Stable today.   7. OSA - Wears CPAP qHS. Per Dr Juanetta Gosling note, plans to switch to BiPAP. Looks like his office tried to call them yesterday. I have asked family to call them back. - Followed closely by Dr Halford Chessman.    BMET today. If creatinine/K stable, increase spiro to 25 mg daily. Recheck BMET in 1 week through [facility]. Keep follow up with Dr [name] 10/30.  Georgiana Shore NP-C  9:12 AM   Greater than 50% of the 25 minute visit was spent in counseling/coordination of care regarding disease state education, salt/fluid restriction, sliding scale diuretics, and medication compliance.

## 2018-09-21 ENCOUNTER — Ambulatory Visit (HOSPITAL_COMMUNITY)
Admission: RE | Admit: 2018-09-21 | Discharge: 2018-09-21 | Disposition: A | Discharge status: Home / Self Care | Payer: Medicare Other | Source: Ambulatory Visit | Attending: Internal Medicine | Admitting: Internal Medicine

## 2018-09-21 ENCOUNTER — Telehealth (HOSPITAL_COMMUNITY): Payer: Self-pay | Admitting: Cardiology

## 2018-09-21 VITALS — BP 162/70 | HR 56 | Resp 18 | Wt 271.6 lb

## 2018-09-21 DIAGNOSIS — I130 Hypertensive heart and chronic kidney disease with heart failure and stage 1 through stage 4 chronic kidney disease, or unspecified chronic kidney disease: Secondary | ICD-10-CM | POA: Insufficient documentation

## 2018-09-21 DIAGNOSIS — G2581 Restless legs syndrome: Secondary | ICD-10-CM | POA: Insufficient documentation

## 2018-09-21 DIAGNOSIS — E039 Hypothyroidism, unspecified: Secondary | ICD-10-CM | POA: Insufficient documentation

## 2018-09-21 DIAGNOSIS — Z79899 Other long term (current) drug therapy: Secondary | ICD-10-CM | POA: Diagnosis not present

## 2018-09-21 DIAGNOSIS — I5032 Chronic diastolic (congestive) heart failure: Secondary | ICD-10-CM | POA: Diagnosis present

## 2018-09-21 DIAGNOSIS — N183 Chronic kidney disease, stage 3 unspecified: Secondary | ICD-10-CM

## 2018-09-21 DIAGNOSIS — R001 Bradycardia, unspecified: Secondary | ICD-10-CM | POA: Diagnosis not present

## 2018-09-21 DIAGNOSIS — J449 Chronic obstructive pulmonary disease, unspecified: Secondary | ICD-10-CM | POA: Insufficient documentation

## 2018-09-21 DIAGNOSIS — Z79890 Hormone replacement therapy (postmenopausal): Secondary | ICD-10-CM | POA: Diagnosis not present

## 2018-09-21 DIAGNOSIS — Z9989 Dependence on other enabling machines and devices: Secondary | ICD-10-CM

## 2018-09-21 DIAGNOSIS — Z7984 Long term (current) use of oral hypoglycemic drugs: Secondary | ICD-10-CM | POA: Insufficient documentation

## 2018-09-21 DIAGNOSIS — I4510 Unspecified right bundle-branch block: Secondary | ICD-10-CM | POA: Diagnosis not present

## 2018-09-21 DIAGNOSIS — G9589 Other specified diseases of spinal cord: Secondary | ICD-10-CM | POA: Diagnosis not present

## 2018-09-21 DIAGNOSIS — E1140 Type 2 diabetes mellitus with diabetic neuropathy, unspecified: Secondary | ICD-10-CM | POA: Diagnosis not present

## 2018-09-21 DIAGNOSIS — E1122 Type 2 diabetes mellitus with diabetic chronic kidney disease: Secondary | ICD-10-CM | POA: Insufficient documentation

## 2018-09-21 DIAGNOSIS — Z8249 Family history of ischemic heart disease and other diseases of the circulatory system: Secondary | ICD-10-CM | POA: Insufficient documentation

## 2018-09-21 DIAGNOSIS — I10 Essential (primary) hypertension: Secondary | ICD-10-CM

## 2018-09-21 DIAGNOSIS — Z87891 Personal history of nicotine dependence: Secondary | ICD-10-CM | POA: Insufficient documentation

## 2018-09-21 DIAGNOSIS — Z9981 Dependence on supplemental oxygen: Secondary | ICD-10-CM | POA: Insufficient documentation

## 2018-09-21 DIAGNOSIS — Z7982 Long term (current) use of aspirin: Secondary | ICD-10-CM | POA: Diagnosis not present

## 2018-09-21 DIAGNOSIS — G4733 Obstructive sleep apnea (adult) (pediatric): Secondary | ICD-10-CM | POA: Diagnosis not present

## 2018-09-21 DIAGNOSIS — J9611 Chronic respiratory failure with hypoxia: Secondary | ICD-10-CM

## 2018-09-21 LAB — BASIC METABOLIC PANEL
Anion gap: 10 (ref 5–15)
BUN: 41 mg/dL — ABNORMAL HIGH (ref 8–23)
CO2: 34 mmol/L — ABNORMAL HIGH (ref 22–32)
Calcium: 9.2 mg/dL (ref 8.9–10.3)
Chloride: 97 mmol/L — ABNORMAL LOW (ref 98–111)
Creatinine, Ser: 1.36 mg/dL — ABNORMAL HIGH (ref 0.61–1.24)
GFR calc Af Amer: 58 mL/min — ABNORMAL LOW (ref >60)
GFR calc non Af Amer: 50 mL/min — ABNORMAL LOW (ref >60)
Glucose, Bld: 182 mg/dL — ABNORMAL HIGH (ref 70–99)
Potassium: 4.4 mmol/L (ref 3.5–5.1)
Sodium: 141 mmol/L (ref 135–145)

## 2018-09-21 MED ORDER — SPIRONOLACTONE 25 MG PO TABS: 25.0000 mg | ORAL_TABLET | Freq: Every day | ORAL | 3 refills | Status: DC

## 2018-09-21 MED ORDER — POTASSIUM CHLORIDE CRYS ER 20 MEQ PO TBCR: 40.0000 meq | EXTENDED_RELEASE_TABLET | Freq: Every day | ORAL | 1 refills | Status: DC

## 2018-09-21 NOTE — Telephone Encounter (Signed)
Attempted to call patient today regarding results. I did not receive an answer at time of call. I have left a voicemail message for pt to return call. X2  

## 2018-09-21 NOTE — Telephone Encounter (Signed)
Notes recorded by Kerry Dory, CMA on 09/21/2018 at 2:21 PM EDT Patient aware. Patient voiced understanding   ------  Notes recorded by Georgiana Shore, NP on 09/21/2018 at 12:13 PM EDT Renal function and potassium stable. Please call and let him know he should increase his spiro to 25 mg daily. He can also decrease his potassium from 40 am/20 pm to 40 meq daily. Has BMET already ordered for 1 week through [facility]. Thanks

## 2018-09-21 NOTE — Patient Instructions (Signed)
Routine lab work today. Will notify you of abnormal results, otherwise no news is good news! If stable, we will call you to instruct you to increase Spironolactone to 25 mg (1 whole tablet) once daily.  Will have [facility] home health draw repeat labs in 1 week.  Follow up as scheduled with Dr. [name].  Take all medication as prescribed the day of your appointment. Bring all medications with you to your appointment.  Do the following things EVERYDAY: 1) Weigh yourself in the morning before breakfast. Write it down and keep it in a log. 2) Take your medicines as prescribed 3) Eat low salt foods-Limit salt (sodium) to 2000 mg per day.  4) Stay as active as you can everyday 5) Limit all fluids for the day to less than 2 liters

## 2018-09-22 NOTE — Telephone Encounter (Signed)
Attempted to call patient today regarding results. I have left a voicemail message for pt to return call. [phone] We have left three messages for pt to call our office back regarding results. No response at this time, a letter has been placed in mail today for pt to call our office. Mailed letter of communication today. Nothing further needed at this time.    

## 2018-09-30 ENCOUNTER — Telehealth: Payer: Self-pay | Admitting: Pulmonary Disease

## 2018-09-30 DIAGNOSIS — G4733 Obstructive sleep apnea (adult) (pediatric): Secondary | ICD-10-CM

## 2018-09-30 NOTE — Telephone Encounter (Signed)
Called and spoke with patient. Was able to talk to patient regarding patient's sleep study results.  They verbalized an understanding of what was discussed. No further questions at this time.   Order placed per Dr. Juanetta Gosling rec.  Appointment rescheduled for December 06, 2018 .  Nothing further needed.

## 2018-10-08 ENCOUNTER — Ambulatory Visit: Payer: Medicare Other | Admitting: Pulmonary Disease

## 2018-10-25 ENCOUNTER — Ambulatory Visit: Payer: Medicare Other | Admitting: Cardiovascular Disease

## 2018-10-27 ENCOUNTER — Encounter (HOSPITAL_COMMUNITY): Payer: Self-pay | Admitting: Internal Medicine

## 2018-10-27 ENCOUNTER — Other Ambulatory Visit: Payer: Self-pay

## 2018-10-27 ENCOUNTER — Ambulatory Visit (HOSPITAL_COMMUNITY)
Admission: RE | Admit: 2018-10-27 | Discharge: 2018-10-27 | Disposition: A | Discharge status: Home / Self Care | Payer: Medicare Other | Source: Ambulatory Visit | Attending: Internal Medicine | Admitting: Internal Medicine

## 2018-10-27 VITALS — BP 150/60 | HR 62 | Wt 275.2 lb

## 2018-10-27 DIAGNOSIS — I2510 Atherosclerotic heart disease of native coronary artery without angina pectoris: Secondary | ICD-10-CM | POA: Insufficient documentation

## 2018-10-27 DIAGNOSIS — N183 Chronic kidney disease, stage 3 (moderate): Secondary | ICD-10-CM | POA: Insufficient documentation

## 2018-10-27 DIAGNOSIS — Z7984 Long term (current) use of oral hypoglycemic drugs: Secondary | ICD-10-CM | POA: Insufficient documentation

## 2018-10-27 DIAGNOSIS — I4510 Unspecified right bundle-branch block: Secondary | ICD-10-CM | POA: Insufficient documentation

## 2018-10-27 DIAGNOSIS — Z87891 Personal history of nicotine dependence: Secondary | ICD-10-CM | POA: Diagnosis not present

## 2018-10-27 DIAGNOSIS — Z7982 Long term (current) use of aspirin: Secondary | ICD-10-CM | POA: Diagnosis not present

## 2018-10-27 DIAGNOSIS — E039 Hypothyroidism, unspecified: Secondary | ICD-10-CM | POA: Insufficient documentation

## 2018-10-27 DIAGNOSIS — G2581 Restless legs syndrome: Secondary | ICD-10-CM | POA: Diagnosis not present

## 2018-10-27 DIAGNOSIS — Z9981 Dependence on supplemental oxygen: Secondary | ICD-10-CM | POA: Diagnosis not present

## 2018-10-27 DIAGNOSIS — Z8249 Family history of ischemic heart disease and other diseases of the circulatory system: Secondary | ICD-10-CM | POA: Diagnosis not present

## 2018-10-27 DIAGNOSIS — Z884 Allergy status to anesthetic agent status: Secondary | ICD-10-CM | POA: Insufficient documentation

## 2018-10-27 DIAGNOSIS — E1122 Type 2 diabetes mellitus with diabetic chronic kidney disease: Secondary | ICD-10-CM | POA: Insufficient documentation

## 2018-10-27 DIAGNOSIS — R001 Bradycardia, unspecified: Secondary | ICD-10-CM | POA: Diagnosis not present

## 2018-10-27 DIAGNOSIS — Z833 Family history of diabetes mellitus: Secondary | ICD-10-CM | POA: Insufficient documentation

## 2018-10-27 DIAGNOSIS — I5032 Chronic diastolic (congestive) heart failure: Secondary | ICD-10-CM | POA: Diagnosis present

## 2018-10-27 DIAGNOSIS — Z79899 Other long term (current) drug therapy: Secondary | ICD-10-CM | POA: Insufficient documentation

## 2018-10-27 DIAGNOSIS — E1140 Type 2 diabetes mellitus with diabetic neuropathy, unspecified: Secondary | ICD-10-CM | POA: Diagnosis not present

## 2018-10-27 DIAGNOSIS — J9611 Chronic respiratory failure with hypoxia: Secondary | ICD-10-CM | POA: Insufficient documentation

## 2018-10-27 DIAGNOSIS — J449 Chronic obstructive pulmonary disease, unspecified: Secondary | ICD-10-CM | POA: Insufficient documentation

## 2018-10-27 DIAGNOSIS — Z79890 Hormone replacement therapy (postmenopausal): Secondary | ICD-10-CM | POA: Diagnosis not present

## 2018-10-27 DIAGNOSIS — I130 Hypertensive heart and chronic kidney disease with heart failure and stage 1 through stage 4 chronic kidney disease, or unspecified chronic kidney disease: Secondary | ICD-10-CM | POA: Insufficient documentation

## 2018-10-27 DIAGNOSIS — G4733 Obstructive sleep apnea (adult) (pediatric): Secondary | ICD-10-CM | POA: Insufficient documentation

## 2018-10-27 LAB — BASIC METABOLIC PANEL
Anion gap: 10 (ref 5–15)
BUN: 55 mg/dL — AB (ref 8–23)
CALCIUM: 9.2 mg/dL (ref 8.9–10.3)
CO2: 27 mmol/L (ref 22–32)
CREATININE: 1.67 mg/dL — AB (ref 0.61–1.24)
Chloride: 98 mmol/L (ref 98–111)
GFR calc non Af Amer: 39 mL/min — ABNORMAL LOW (ref >60)
GFR, EST AFRICAN AMERICAN: 45 mL/min — AB (ref >60)
Glucose, Bld: 342 mg/dL — ABNORMAL HIGH (ref 70–99)
Potassium: 5 mmol/L (ref 3.5–5.1)
SODIUM: 135 mmol/L (ref 135–145)

## 2018-10-27 LAB — BRAIN NATRIURETIC PEPTIDE: B NATRIURETIC PEPTIDE 5: 38.1 pg/mL (ref 0.0–100.0)

## 2018-10-27 MED ORDER — EMPAGLIFLOZIN 10 MG PO TABS: 10.0000 mg | ORAL_TABLET | Freq: Every day | ORAL | 6 refills | Status: DC

## 2018-10-27 NOTE — Progress Notes (Signed)
ADVANCED HF CLINIC NOTE  Referring Physician: Richardson Dopp PA Primary Care: Dr Doran Stabler  Primary Cardiologist: Dr Acie Fredrickson  Pulmonary: Dr Halford Chessman HF MD: Dr [name]  HPI: Mr Quebedeaux is a 72 year old referred by Richardson Dopp PA for consultation for heart failure.   Mr Banfill is a 72 year old with a history of diastolic heart failure, HTN, hypothyroidism, OSA, DMII, COPD on chronic oxygen, and spinal stenosis.   Admitted 04/08/2018 for decompression lumber laminectomy. Hospital course was complicated by respiratory failure and severe deconditioning. He was discharged to [facility] and went home on 05/12/2018.   Admitted 7/1 through 07/04/18. Admitted with AMS due to CO2 retention/hypercarbic respiratory failure due to CO2 retention and A/C diastolic heart failure. BB decreased in the hospital due to bradycardic. Discharge weight 275 pounds.  He returns today for HF follow up. Last visit spiro was increased. Weight is up 4 lbs.   He returns today for HF follow up. Here with his daughter and his wife. Overall swelling continues to improve but says it has gone the other way for past 3-4 days. Taking torsemide 60 bid and spiro 25 daily. Drinks a lot of water and diet Coke because he is thirsty. No longer requiring [employer].  Had a repeat sleep study with AHI 50 and now on Bipap. Sleeps in hospital bed. Occasional orthopnea. No CP. Weighing daily. Weight stable at 275. [facility] and PT coming out. SBP 120-130. Can walk with a walker. Previously seen by Dr. Jannifer Franklin for neuropathy.     Cardiac Testing  Echocardiogram 06/29/2018 EF 55-60, normal wall motion, grade 1 diastolic dysfunction Echocardiogram 03/06/17 Mild LVH, EF 65-70, normal wall motion, grade 2 diastolic dysfunction, mild LAE Echo 08/21/15 EF 55-60, normal wall motion, grade 2 diastolic dysfunction, mild LAE  R/L cardiac catheterization 09/07/15 LM 40 LAD distal 40 LCx ostial 50 RCA proximal 40 EF 55-60 LVEDP 16, mean RA 4, PASP 45, mean  PCWP 13  Nuclear stress test 03/19/15 EF 48, no ischemia, low risk  Review of systems complete and found to be negative unless listed in HPI.   Past Medical History:  Diagnosis Date  . Cervical myelopathy (HCC)    with myelomalacia at C5-6 and C3-4  . CHF (congestive heart failure) ([place])   . Constipation - functional    controlled with miralax  . COPD (chronic obstructive pulmonary disease) (HCC)    mild  . DJD (degenerative joint disease)    WHOLE SPINE  . Dyspnea   . Gait disorder   . Heart murmur    years ago   . Hypertension   . Hypothyroidism    Low d/t treatment of hyperthyroidism  . Lumbosacral spinal stenosis 03/13/2014  . On home oxygen therapy    "2.5L all the time" (06/29/2018)  . Peripheral neuropathy   . Pneumonia   . PONV (postoperative nausea and vomiting)   . RBBB   . Renal disorder    acute kidney failure  . Restless legs syndrome 03/14/2015  . RLS (restless legs syndrome)   . Sleep apnea    DX  2009/2010 study done at [facility]  . 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 Type II diabetes mellitus (HCC)    diet controlled    Current Outpatient Medications  Medication Sig Dispense Refill  . aspirin EC 81 MG EC tablet Take 1 tablet (81 mg total) by mouth daily. 30 tablet 0  . escitalopram (LEXAPRO) 10 MG tablet Take 1 tablet (  10 mg total) by mouth daily. 30 tablet 2  . gabapentin (NEURONTIN) 400 MG capsule TAKE 1 CAPSULE (400 MG TOTAL) BY MOUTH EVERY 8 (EIGHT) HOURS. 90 capsule 0  . glimepiride (AMARYL) 2 MG tablet Take 1 tablet (2 mg total) by mouth daily with breakfast. 30 tablet 1  . levothyroxine (SYNTHROID, LEVOTHROID) 175 MCG tablet Take 1 tablet (175 mcg total) by mouth daily. 30 tablet 2  . metoprolol tartrate (LOPRESSOR) 25 MG tablet Take 0.5 tablets (12.5 mg total) by mouth daily. 45 tablet 3  . polyethylene glycol (MIRALAX / GLYCOLAX) packet Take 17 g by mouth daily as needed. For constipation 14 each 0  . potassium chloride SA (K-DUR,KLOR-CON) 20  MEQ tablet Take 2 tablets (40 mEq total) by mouth daily. 60 tablet 1  . pramipexole (MIRAPEX) 0.5 MG tablet Take 1 tablet (0.5 mg total) by mouth 2 (two) times daily. 60 tablet 0  . senna (SENOKOT) 8.6 MG TABS tablet Take 2 tablets (17.2 mg total) by mouth 2 (two) times daily. 120 each 0  . spironolactone (ALDACTONE) 25 MG tablet Take 1 tablet (25 mg total) by mouth daily. 90 tablet 3  . tamsulosin (FLOMAX) 0.4 MG CAPS capsule Take 1 capsule (0.4 mg total) by mouth daily. 30 capsule 0  . torsemide (DEMADEX) 20 MG tablet Take 3 tablets (60 mg total) by mouth 2 (two) times daily. 180 tablet 11  . TOVIAZ 8 MG TB24 tablet Take 1 tablet (8 mg total) by mouth daily. 30 tablet 11  . hydrALAZINE (APRESOLINE) 50 MG tablet Take 1.5 tablets (75 mg total) by mouth 3 (three) times daily. 378 tablet 3   No current facility-administered medications for this encounter.     Allergies  Allergen Reactions  . Other Other (See Comments)    ANESTHESIA UNSPECIFIED  -- makes sick, must have antinausea medication in advance.  . Levofloxacin In D5w Itching    Possible infusion reaction 03/29/17. Tolerating oral Levaquin 03/30/17      Social History   Socioeconomic History  . Marital status: Married    Spouse name: carolyn  . Number of children: 3  . Years of education: 67  . Highest education level: Not on file  Occupational History    Employer: [employer]: Works in [place]  . Financial resource strain: Not on file  . Food insecurity:    Worry: Not on file    Inability: Not on file  . Transportation needs:    Medical: Not on file    Non-medical: Not on file  Tobacco Use  . Smoking status: Former Smoker    Packs/day: 2.00    Years: 20.00    Pack years: 40.00    Types: Cigarettes    Last attempt to quit: 06/28/1985    Years since quitting: 33.3  . Smokeless tobacco: Former Systems developer    Quit date: 06/28/1985  Substance and Sexual Activity  . Alcohol use: No   . Drug use: No  . Sexual activity: Not on file  Lifestyle  . Physical activity:    Days per week: Not on file    Minutes per session: Not on file  . Stress: Not on file  Relationships  . Social connections:    Talks on phone: Not on file    Gets together: Not on file    Attends religious service: Not on file    Active member of club or organization: Not on file  Attends meetings of clubs or organizations: Not on file    Relationship status: Not on file  . Intimate partner violence:    Fear of current or ex partner: Not on file    Emotionally abused: Not on file    Physically abused: Not on file    Forced sexual activity: Not on file  Other Topics Concern  . Not on file  Social History Narrative   Lives with wife,  [name]   Patient is married with 3 children.   Patient has 16 yrs of education.   Patient is right handed.   Patient drinks 2 cups of caffeine per day.      Family History  Problem Relation Age of Onset  . Polycythemia Mother   . Diabetes Father   . Hypertension Father   . Heart disease Father   . CAD Unknown        1 of his 4 brothers and father has CAD    Vitals:   10/27/18 0919  BP: (!) 150/60  Pulse: 62  SpO2: 95%  Weight: 124.8 kg (275 lb 3.2 oz)   Wt Readings from Last 3 Encounters:  10/27/18 124.8 kg (275 lb 3.2 oz)  09/21/18 123.2 kg (271 lb 9.6 oz)  09/14/18 126.1 kg (278 lb)    PHYSICAL EXAM: General:  Obese male sitting in WC NAD HEENT: normal Neck: supple. no JVD. Carotids 2+ bilat; no bruits. No lymphadenopathy or thryomegaly appreciated. Cor: PMI nondisplaced. Regular rate & rhythm. No rubs, gallops or murmurs. Lungs: clear. Decreased throughout Abdomen: obese soft, nontender, mildly distended. No hepatosplenomegaly. No bruits or masses. Good bowel sounds. Extremities: no cyanosis, clubbing, rash, 2+ edema Neuro: alert & orientedx3, cranial nerves grossly intact. moves all 4 extremities w/o difficulty. Affect  pleasant   ASSESSMENT & PLAN:  1. Chronic Diastolic Heart Failure with R>>L symptoms - ECHO 06/29/2018 EF 55-60% Grade IDD  - Suspect due to R-sided HF from OSA, OHS and COPD - Myeloma panel with no M-spike, normal IFE.  - NYHA III, difficult to assess with inactivity.  - Volume status improving but still elevated.  - Continue torsemide 60 mg twice a day and spiro 25 daily - Reinforced need for fluid restriction, daily weights and reviewed use of sliding scale diuretics. - Add Jardiance 10  - Check PYP sca   2.Chronic Hypoxic Respiratory Failure/COPD -On 2-2.5 liters chronically -O2 sats stable. Now switched BiPAP to Dr. Halford Chessman  3. CKD Stage III -Creatinine baseline 1.4-1.6  -BMET today  4. HTN - Elevated here but well controlled at home  - Adding Jardiance .  5. Hypothyroidism  - Per PCP.  6. Bradycardia Improved with decreased dose of Toprol. Stable today.   7. OSA - Now on BIPA per Dr. Halford Chessman  8. DM2 - Follows with Dr. Doran Stabler - Given CAD and HF will add Jardiance 10 - Eventually consider switching Amaryl to metformin   9. CAD - nonobstructive - On ASA - Add statin at next visit  10. Deconditioning - Continue HHPT - Will need consistent exercise program once done with HHPT  Glori Bickers, MD  10:14 AM

## 2018-10-27 NOTE — Addendum Note (Signed)
Encounter addended by: Scarlette Calico, RN on: 10/27/2018 12:30 PM  Actions taken: Charge Capture section accepted

## 2018-10-27 NOTE — Patient Instructions (Signed)
Start Jardiance 10 mg daily  Labs done today  You have been ordered a PYP Scan.  This is done in the Radiology Department of [facility].  When you come for this test please plan to be there 2-3 hours.  Your physician recommends that you schedule a follow-up appointment in: 4 weeks

## 2018-10-29 ENCOUNTER — Telehealth: Payer: Self-pay | Admitting: Cardiovascular Disease

## 2018-10-29 ENCOUNTER — Telehealth (HOSPITAL_COMMUNITY): Payer: Self-pay

## 2018-10-29 DIAGNOSIS — I5032 Chronic diastolic (congestive) heart failure: Secondary | ICD-10-CM

## 2018-10-29 MED ORDER — SPIRONOLACTONE 25 MG PO TABS: 25.0000 mg | ORAL_TABLET | Freq: Every day | ORAL | 3 refills | Status: AC

## 2018-10-29 NOTE — Telephone Encounter (Signed)
Spoke with Hayley who is working with [employer] on packaging patient's medications. She requests a refill of patient's spironolactone at appropriate dose. I reviewed Dr. Bernita Buffy office visit from 10/30 to confirm that patient will continue 25 mg daily and advised Hayley that refill has been sent. She thanked me for the call.

## 2018-10-29 NOTE — Telephone Encounter (Signed)
Pt called Looks  bit dry. Repeat BMET 1 week Lab appointment 11/03/18 at 9:15

## 2018-10-29 NOTE — Telephone Encounter (Signed)
New message   Pt c/o medication issue:  1. Name of Medication: spironolactone (ALDACTONE) 25 MG tablet  2. How are you currently taking this medication (dosage and times per day)? Take 1 tablet (25 mg total) by mouth daily.  3. Are you having a reaction (difficulty breathing--STAT)? No   4. What is your medication issue? Per Hayley need to verify the dosage of this medication. Please advise.

## 2018-11-03 ENCOUNTER — Ambulatory Visit (HOSPITAL_COMMUNITY)
Admission: RE | Admit: 2018-11-03 | Discharge: 2018-11-03 | Disposition: A | Discharge status: Home / Self Care | Payer: Medicare Other | Source: Ambulatory Visit | Attending: Cardiology | Admitting: Cardiology

## 2018-11-03 ENCOUNTER — Telehealth (HOSPITAL_COMMUNITY): Payer: Self-pay | Admitting: Vascular Surgery

## 2018-11-03 DIAGNOSIS — I5032 Chronic diastolic (congestive) heart failure: Secondary | ICD-10-CM | POA: Diagnosis not present

## 2018-11-03 LAB — BASIC METABOLIC PANEL
Anion gap: 12 (ref 5–15)
BUN: 66 mg/dL — ABNORMAL HIGH (ref 8–23)
CHLORIDE: 94 mmol/L — AB (ref 98–111)
CO2: 29 mmol/L (ref 22–32)
Calcium: 9.2 mg/dL (ref 8.9–10.3)
Creatinine, Ser: 2.08 mg/dL — ABNORMAL HIGH (ref 0.61–1.24)
GFR, EST AFRICAN AMERICAN: 35 mL/min — AB (ref >60)
GFR, EST NON AFRICAN AMERICAN: 30 mL/min — AB (ref >60)
Glucose, Bld: 455 mg/dL — ABNORMAL HIGH (ref 70–99)
Potassium: 4.3 mmol/L (ref 3.5–5.1)
SODIUM: 135 mmol/L (ref 135–145)

## 2018-11-03 NOTE — Telephone Encounter (Signed)
Left pt message giving PYP SCAN APPT 11/21 @ 9 am, arr @ 8:45. Asked pt to call back to confirm appt

## 2018-11-18 ENCOUNTER — Ambulatory Visit (HOSPITAL_COMMUNITY): Payer: Medicare Other

## 2018-11-18 ENCOUNTER — Encounter (HOSPITAL_COMMUNITY): Payer: Medicare Other

## 2018-11-20 ENCOUNTER — Encounter (HOSPITAL_COMMUNITY): Payer: Self-pay | Admitting: General Practice

## 2018-11-20 ENCOUNTER — Inpatient Hospital Stay (HOSPITAL_COMMUNITY)
Admission: AD | Admit: 2018-11-20 | Discharge: 2018-11-22 | DRG: 690 | Disposition: A | Discharge status: Home Health | Payer: Medicare Other | Source: Ambulatory Visit | Attending: Internal Medicine | Admitting: Internal Medicine

## 2018-11-20 DIAGNOSIS — M4807 Spinal stenosis, lumbosacral region: Secondary | ICD-10-CM | POA: Diagnosis present

## 2018-11-20 DIAGNOSIS — Z9049 Acquired absence of other specified parts of digestive tract: Secondary | ICD-10-CM

## 2018-11-20 DIAGNOSIS — E1142 Type 2 diabetes mellitus with diabetic polyneuropathy: Secondary | ICD-10-CM | POA: Diagnosis present

## 2018-11-20 DIAGNOSIS — Z789 Other specified health status: Secondary | ICD-10-CM

## 2018-11-20 DIAGNOSIS — Z9981 Dependence on supplemental oxygen: Secondary | ICD-10-CM

## 2018-11-20 DIAGNOSIS — R269 Unspecified abnormalities of gait and mobility: Secondary | ICD-10-CM

## 2018-11-20 DIAGNOSIS — I130 Hypertensive heart and chronic kidney disease with heart failure and stage 1 through stage 4 chronic kidney disease, or unspecified chronic kidney disease: Secondary | ICD-10-CM | POA: Diagnosis present

## 2018-11-20 DIAGNOSIS — G4733 Obstructive sleep apnea (adult) (pediatric): Secondary | ICD-10-CM

## 2018-11-20 DIAGNOSIS — Z9989 Dependence on other enabling machines and devices: Secondary | ICD-10-CM

## 2018-11-20 DIAGNOSIS — R338 Other retention of urine: Secondary | ICD-10-CM | POA: Diagnosis not present

## 2018-11-20 DIAGNOSIS — I5033 Acute on chronic diastolic (congestive) heart failure: Secondary | ICD-10-CM | POA: Diagnosis present

## 2018-11-20 DIAGNOSIS — E1165 Type 2 diabetes mellitus with hyperglycemia: Secondary | ICD-10-CM | POA: Diagnosis present

## 2018-11-20 DIAGNOSIS — Z7982 Long term (current) use of aspirin: Secondary | ICD-10-CM

## 2018-11-20 DIAGNOSIS — G8929 Other chronic pain: Secondary | ICD-10-CM | POA: Diagnosis present

## 2018-11-20 DIAGNOSIS — Z87891 Personal history of nicotine dependence: Secondary | ICD-10-CM | POA: Diagnosis not present

## 2018-11-20 DIAGNOSIS — N401 Enlarged prostate with lower urinary tract symptoms: Secondary | ICD-10-CM | POA: Diagnosis present

## 2018-11-20 DIAGNOSIS — N184 Chronic kidney disease, stage 4 (severe): Secondary | ICD-10-CM | POA: Diagnosis present

## 2018-11-20 DIAGNOSIS — N183 Chronic kidney disease, stage 3 unspecified: Secondary | ICD-10-CM

## 2018-11-20 DIAGNOSIS — M4714 Other spondylosis with myelopathy, thoracic region: Secondary | ICD-10-CM | POA: Diagnosis present

## 2018-11-20 DIAGNOSIS — B965 Pseudomonas (aeruginosa) (mallei) (pseudomallei) as the cause of diseases classified elsewhere: Secondary | ICD-10-CM | POA: Diagnosis present

## 2018-11-20 DIAGNOSIS — E039 Hypothyroidism, unspecified: Secondary | ICD-10-CM | POA: Diagnosis present

## 2018-11-20 DIAGNOSIS — N390 Urinary tract infection, site not specified: Principal | ICD-10-CM | POA: Diagnosis present

## 2018-11-20 DIAGNOSIS — E1122 Type 2 diabetes mellitus with diabetic chronic kidney disease: Secondary | ICD-10-CM | POA: Diagnosis present

## 2018-11-20 DIAGNOSIS — I5032 Chronic diastolic (congestive) heart failure: Secondary | ICD-10-CM

## 2018-11-20 DIAGNOSIS — E11649 Type 2 diabetes mellitus with hypoglycemia without coma: Secondary | ICD-10-CM | POA: Diagnosis present

## 2018-11-20 DIAGNOSIS — B9689 Other specified bacterial agents as the cause of diseases classified elsewhere: Secondary | ICD-10-CM | POA: Diagnosis present

## 2018-11-20 DIAGNOSIS — M47814 Spondylosis without myelopathy or radiculopathy, thoracic region: Secondary | ICD-10-CM | POA: Diagnosis present

## 2018-11-20 DIAGNOSIS — K5900 Constipation, unspecified: Secondary | ICD-10-CM | POA: Diagnosis present

## 2018-11-20 DIAGNOSIS — G2581 Restless legs syndrome: Secondary | ICD-10-CM | POA: Diagnosis present

## 2018-11-20 DIAGNOSIS — I700 Atherosclerosis of aorta: Secondary | ICD-10-CM | POA: Diagnosis present

## 2018-11-20 DIAGNOSIS — Z79890 Hormone replacement therapy (postmenopausal): Secondary | ICD-10-CM

## 2018-11-20 DIAGNOSIS — Z981 Arthrodesis status: Secondary | ICD-10-CM

## 2018-11-20 DIAGNOSIS — Z6833 Body mass index (BMI) 33.0-33.9, adult: Secondary | ICD-10-CM

## 2018-11-20 DIAGNOSIS — Z794 Long term (current) use of insulin: Secondary | ICD-10-CM

## 2018-11-20 DIAGNOSIS — L8990 Pressure ulcer of unspecified site, unspecified stage: Secondary | ICD-10-CM

## 2018-11-20 DIAGNOSIS — N281 Cyst of kidney, acquired: Secondary | ICD-10-CM | POA: Diagnosis present

## 2018-11-20 DIAGNOSIS — Z888 Allergy status to other drugs, medicaments and biological substances status: Secondary | ICD-10-CM

## 2018-11-20 DIAGNOSIS — J449 Chronic obstructive pulmonary disease, unspecified: Secondary | ICD-10-CM | POA: Diagnosis present

## 2018-11-20 DIAGNOSIS — Z8249 Family history of ischemic heart disease and other diseases of the circulatory system: Secondary | ICD-10-CM

## 2018-11-20 DIAGNOSIS — I10 Essential (primary) hypertension: Secondary | ICD-10-CM | POA: Diagnosis present

## 2018-11-20 DIAGNOSIS — L89152 Pressure ulcer of sacral region, stage 2: Secondary | ICD-10-CM | POA: Diagnosis present

## 2018-11-20 DIAGNOSIS — Z79899 Other long term (current) drug therapy: Secondary | ICD-10-CM

## 2018-11-20 DIAGNOSIS — Z87440 Personal history of urinary (tract) infections: Secondary | ICD-10-CM

## 2018-11-20 DIAGNOSIS — R32 Unspecified urinary incontinence: Secondary | ICD-10-CM | POA: Diagnosis present

## 2018-11-20 DIAGNOSIS — Z833 Family history of diabetes mellitus: Secondary | ICD-10-CM

## 2018-11-20 LAB — GLUCOSE, CAPILLARY: Glucose-Capillary: 367 mg/dL — ABNORMAL HIGH (ref 70–99)

## 2018-11-20 LAB — COMPREHENSIVE METABOLIC PANEL
ALBUMIN: 3.7 g/dL (ref 3.5–5.0)
ALK PHOS: 89 U/L (ref 38–126)
ALT: 19 U/L (ref 0–44)
ANION GAP: 8 (ref 5–15)
AST: 17 U/L (ref 15–41)
BILIRUBIN TOTAL: 0.6 mg/dL (ref 0.3–1.2)
BUN: 56 mg/dL — ABNORMAL HIGH (ref 8–23)
CO2: 31 mmol/L (ref 22–32)
Calcium: 9.3 mg/dL (ref 8.9–10.3)
Chloride: 93 mmol/L — ABNORMAL LOW (ref 98–111)
Creatinine, Ser: 2.03 mg/dL — ABNORMAL HIGH (ref 0.61–1.24)
GFR, EST AFRICAN AMERICAN: 36 mL/min — AB (ref >60)
GFR, EST NON AFRICAN AMERICAN: 31 mL/min — AB (ref >60)
Glucose, Bld: 386 mg/dL — ABNORMAL HIGH (ref 70–99)
POTASSIUM: 4.8 mmol/L (ref 3.5–5.1)
Sodium: 132 mmol/L — ABNORMAL LOW (ref 135–145)
TOTAL PROTEIN: 8 g/dL (ref 6.5–8.1)

## 2018-11-20 LAB — CBC
HCT: 42.6 % (ref 39.0–52.0)
HEMOGLOBIN: 13.2 g/dL (ref 13.0–17.0)
MCH: 27.6 pg (ref 26.0–34.0)
MCHC: 31 g/dL (ref 30.0–36.0)
MCV: 89.1 fL (ref 80.0–100.0)
Platelets: 207 10*3/uL (ref 150–400)
RBC: 4.78 MIL/uL (ref 4.22–5.81)
RDW: 15.7 % — AB (ref 11.5–15.5)
WBC: 6.8 10*3/uL (ref 4.0–10.5)
nRBC: 0 % (ref 0.0–0.2)

## 2018-11-20 MED ORDER — TORSEMIDE 20 MG PO TABS
60.0000 mg | ORAL_TABLET | Freq: Two times a day (BID) | ORAL | Status: DC
  Administered 2018-11-20 – 2018-11-22 (×4): 60 mg via ORAL
  Filled 2018-11-20 (×4): qty 3

## 2018-11-20 MED ORDER — PRAMIPEXOLE DIHYDROCHLORIDE 0.25 MG PO TABS
0.5000 mg | ORAL_TABLET | Freq: Two times a day (BID) | ORAL | Status: DC
Start: 2018-11-20 — End: 2018-11-22
  Administered 2018-11-20 – 2018-11-22 (×4): 0.5 mg via ORAL
  Filled 2018-11-20 (×4): qty 2

## 2018-11-20 MED ORDER — SODIUM CHLORIDE 0.9% FLUSH: 3.0000 mL | INTRAVENOUS | Status: DC | PRN

## 2018-11-20 MED ORDER — GLIMEPIRIDE 2 MG PO TABS
2.0000 mg | ORAL_TABLET | Freq: Every day | ORAL | Status: DC
  Administered 2018-11-21: 2 mg via ORAL
  Filled 2018-11-20: qty 1

## 2018-11-20 MED ORDER — HYDRALAZINE HCL 50 MG PO TABS
75.0000 mg | ORAL_TABLET | Freq: Three times a day (TID) | ORAL | Status: DC
  Administered 2018-11-20 – 2018-11-22 (×6): 75 mg via ORAL
  Filled 2018-11-20 (×6): qty 1

## 2018-11-20 MED ORDER — ASPIRIN EC 81 MG PO TBEC
81.0000 mg | DELAYED_RELEASE_TABLET | Freq: Every day | ORAL | Status: DC
  Administered 2018-11-21 – 2018-11-22 (×2): 81 mg via ORAL
  Filled 2018-11-20 (×3): qty 1

## 2018-11-20 MED ORDER — SPIRONOLACTONE 25 MG PO TABS
25.0000 mg | ORAL_TABLET | Freq: Every day | ORAL | Status: DC
  Administered 2018-11-21 – 2018-11-22 (×2): 25 mg via ORAL
  Filled 2018-11-20 (×2): qty 1

## 2018-11-20 MED ORDER — SODIUM CHLORIDE 0.9 % IV SOLN: 1.0000 g | Freq: Two times a day (BID) | INTRAVENOUS | Status: DC

## 2018-11-20 MED ORDER — ONDANSETRON HCL 4 MG/2ML IJ SOLN: 4.0000 mg | Freq: Four times a day (QID) | INTRAMUSCULAR | Status: DC | PRN

## 2018-11-20 MED ORDER — SODIUM CHLORIDE 0.9 % IV SOLN
2.0000 g | INTRAVENOUS | Status: DC
  Administered 2018-11-20 – 2018-11-22 (×3): 2 g via INTRAVENOUS
  Filled 2018-11-20 (×3): qty 2

## 2018-11-20 MED ORDER — POTASSIUM CHLORIDE CRYS ER 20 MEQ PO TBCR
40.0000 meq | EXTENDED_RELEASE_TABLET | Freq: Every day | ORAL | Status: DC
  Administered 2018-11-21 – 2018-11-22 (×2): 40 meq via ORAL
  Filled 2018-11-20 (×2): qty 2

## 2018-11-20 MED ORDER — SENNA 8.6 MG PO TABS
2.0000 | ORAL_TABLET | Freq: Two times a day (BID) | ORAL | Status: DC
  Administered 2018-11-21 – 2018-11-22 (×2): 17.2 mg via ORAL
  Filled 2018-11-20 (×3): qty 2

## 2018-11-20 MED ORDER — TAMSULOSIN HCL 0.4 MG PO CAPS
0.4000 mg | ORAL_CAPSULE | Freq: Every day | ORAL | Status: DC
  Administered 2018-11-21 – 2018-11-22 (×2): 0.4 mg via ORAL
  Filled 2018-11-20 (×2): qty 1

## 2018-11-20 MED ORDER — FESOTERODINE FUMARATE ER 8 MG PO TB24
8.0000 mg | ORAL_TABLET | Freq: Every day | ORAL | Status: DC
  Administered 2018-11-21 – 2018-11-22 (×2): 8 mg via ORAL
  Filled 2018-11-20 (×2): qty 1

## 2018-11-20 MED ORDER — ESCITALOPRAM OXALATE 10 MG PO TABS
10.0000 mg | ORAL_TABLET | Freq: Every day | ORAL | Status: DC
  Administered 2018-11-21 – 2018-11-22 (×2): 10 mg via ORAL
  Filled 2018-11-20 (×2): qty 1

## 2018-11-20 MED ORDER — POLYETHYLENE GLYCOL 3350 17 G PO PACK
17.0000 g | PACK | Freq: Every day | ORAL | Status: DC
  Filled 2018-11-20 (×2): qty 1

## 2018-11-20 MED ORDER — SODIUM CHLORIDE 0.9 % IV SOLN: 250.0000 mL | INTRAVENOUS | Status: DC | PRN

## 2018-11-20 MED ORDER — LEVOTHYROXINE SODIUM 50 MCG PO TABS
175.0000 ug | ORAL_TABLET | Freq: Every day | ORAL | Status: DC
  Administered 2018-11-21 – 2018-11-22 (×2): 175 ug via ORAL
  Filled 2018-11-20 (×2): qty 1

## 2018-11-20 MED ORDER — ENOXAPARIN SODIUM 30 MG/0.3ML ~~LOC~~ SOLN
30.0000 mg | SUBCUTANEOUS | Status: DC
  Administered 2018-11-20: 30 mg via SUBCUTANEOUS
  Filled 2018-11-20: qty 0.3

## 2018-11-20 MED ORDER — SODIUM CHLORIDE 0.9% FLUSH
3.0000 mL | Freq: Two times a day (BID) | INTRAVENOUS | Status: DC
  Administered 2018-11-20 – 2018-11-22 (×3): 3 mL via INTRAVENOUS

## 2018-11-20 MED ORDER — ONDANSETRON HCL 4 MG PO TABS: 4.0000 mg | ORAL_TABLET | Freq: Four times a day (QID) | ORAL | Status: DC | PRN

## 2018-11-20 MED ORDER — INSULIN ASPART 100 UNIT/ML ~~LOC~~ SOLN
0.0000 [IU] | Freq: Three times a day (TID) | SUBCUTANEOUS | Status: DC
  Administered 2018-11-21: 15 [IU] via SUBCUTANEOUS

## 2018-11-20 MED ORDER — METOPROLOL TARTRATE 12.5 MG HALF TABLET
12.5000 mg | ORAL_TABLET | Freq: Every day | ORAL | Status: DC
  Administered 2018-11-21 – 2018-11-22 (×2): 12.5 mg via ORAL
  Filled 2018-11-20 (×2): qty 1

## 2018-11-20 NOTE — Progress Notes (Signed)
Skin assessment complete. Pt has small full thickness wound to sacrum. Measuring 1cm x 1cm x 0.5cm. Surrounding skin is red but blanchable. Patient also has redness under abdominal fold and groin area. Skin assessment completed with Fredrich Romans, RN.

## 2018-11-20 NOTE — H&P (Addendum)
History and Physical  Travis [name] EXH:[id number] DOB: 09/20/1946 DOA: 11/20/2018  Referring physician: [name], MD PCP: Travis Arabian, MD  Outpatient Specialists: Dr. Berkley Harvey cardiology Patient coming from: Home & is able to ambulate ambulate  Chief Complaint: Positive urine culture Dysuria   HPI: Travis [name] is a 72 y.o. male with medical history significant for congestive heart failure, COPD, obstructive sleep apnea on home BiPAP hypertension hypothyroidism who is being admitted directly from home after his primary care doctor Travis [name] called to say that he has positiveUrine culture that is growing Pseudomonas and Enterobacter and there is sensitivity did not show any possible outpatient medication all of the sensitivity was for IV antibiotics which includes cefepime and gentamicin and tobramycin.  Patient continues to complain of dysuria and frequency patient denies any fever or chills.  He had been seen recently by the urologist actually on Monday for the dysuria he and incontinence and he was still working on giving him  some treatment for it.  Patient has had history of intubation about 3 years ago.  Daughter-in-law who is at bedside stated that patient easily gets fluid overloaded does not want any IV fluid unless is absolutely necessary.  Also that they just recently were able to remove 30 pounds of fluid from him in his left lower arm his lower extremity edema has come down tremendously  ED Course: Patient was direct admitted from home  Review of Systems:  . Pt complains of dysuria frequency, back pain suprapubic pain nausea and vomiting headache  Pt denies any denies chest pain shortness of breath.  Review of systems are otherwise negative   Past Medical History:  Diagnosis Date  . Cervical myelopathy (HCC)    with myelomalacia at C5-6 and C3-4  . CHF (congestive heart failure) ([place])   . Constipation - functional    controlled with miralax  . COPD  (chronic obstructive pulmonary disease) (HCC)    mild  . DJD (degenerative joint disease)    WHOLE SPINE  . Dyspnea   . Gait disorder   . Heart murmur    years ago   . Hypertension   . Hypothyroidism    Low d/t treatment of hyperthyroidism  . Lumbosacral spinal stenosis 03/13/2014  . On home oxygen therapy    "2.5L all the time" (06/29/2018)  . Peripheral neuropathy   . Pneumonia   . PONV (postoperative nausea and vomiting)   . RBBB   . Renal disorder    acute kidney failure  . Restless legs syndrome 03/14/2015  . RLS (restless legs syndrome)   . Sleep apnea    DX  2009/2010 study done at [facility]  . 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 Type II diabetes mellitus ([place])    diet controlled   Past Surgical History:  Procedure Laterality Date  . BACK SURGERY     6 surgeries  . CARDIAC CATHETERIZATION [name] 09/07/2015   Procedure: Right/Left Heart Cath and Coronary Angiography;  Surgeon: Peter M Martinique, MD;  Location: [place] [facility];  Service: Cardiovascular;  Laterality: [name];  . CHOLECYSTECTOMY    . EYE SURGERY     BIL CATARACTS  . LUMBAR LAMINECTOMY/DECOMPRESSION MICRODISCECTOMY  07/14/2012   Procedure: LUMBAR LAMINECTOMY/DECOMPRESSION MICRODISCECTOMY 2 LEVELS;  Surgeon: Eustace Moore, MD;  Location: [place] [facility];  Service: Neurosurgery;  Laterality: Left;  Lumbar two three laminectomy, left thoracic four five transpedicular diskectomy  . POSTERIOR CERVICAL FUSION/FORAMINOTOMY  04/08/2018  Procedure: Laminectomy and Foraminotomy - Lumbar Four-Lumbar Five, instrumented posterolateral fusion Lumbar Four- Five, Laminectomy and Foraminotomy - Thoracic One-Thoracic Two;  Surgeon: Eustace Moore, MD;  Location: [facility];  Service: Neurosurgery;;  posterior  . POSTERIOR LUMBAR FUSION  04/08/2018   Laminectomy and Foraminotomy - Lumbar Four-Lumbar Five, instrumented posterolateral fusion Lumbar Four- Five, Laminectomy and Foraminotomy - Thoracic One-Thoracic Two  . POSTERIOR LUMBAR FUSION   04/08/2018   Laminectomy and Foraminotomy - Lumbar Four-Lumbar Five, instrumented posterolateral fusion Lumbar Four- Five, Laminectomy and Foraminotomy - Thoracic One-Thoracic Two    Social History:  reports that he quit smoking about 33 years ago. His smoking use included cigarettes. He has a 40.00 pack-year smoking history. He quit smokeless tobacco use about 33 years ago. He reports that he does not drink alcohol or use drugs.   Allergies  Allergen Reactions  . Other Other (See Comments)    ANESTHESIA UNSPECIFIED  -- makes sick, must have antinausea medication in advance.  . Levofloxacin In D5w Itching    Possible infusion reaction 03/29/17. Tolerating oral Levaquin 03/30/17    Family History  Problem Relation Age of Onset  . Polycythemia Mother   . Diabetes Father   . Hypertension Father   . Heart disease Father   . CAD Unknown        1 of his 4 brothers and father has CAD      Prior to Admission medications   Medication Sig Start Date End Date Taking? Authorizing Provider  aspirin EC 81 MG EC tablet Take 1 tablet (81 mg total) by mouth daily. 08/23/15   Thurnell Lose, MD  empagliflozin (JARDIANCE) 10 MG TABS tablet Take 10 mg by mouth daily. 10/27/18   Bensimhon, Shaune Pascal, MD  escitalopram (LEXAPRO) 10 MG tablet Take 1 tablet (10 mg total) by mouth daily. 05/12/18   Angiulli, Lavon Paganini, [name]  gabapentin (NEURONTIN) 400 MG capsule TAKE 1 CAPSULE (400 MG TOTAL) BY MOUTH EVERY 8 (EIGHT) HOURS. 05/12/18   Angiulli, Lavon Paganini, [name]  glimepiride (AMARYL) 2 MG tablet Take 1 tablet (2 mg total) by mouth daily with breakfast. 05/13/18   Angiulli, Lavon Paganini, [name]  hydrALAZINE (APRESOLINE) 50 MG tablet Take 1.5 tablets (75 mg total) by mouth 3 (three) times daily. 07/23/18 10/21/18  Richardson Dopp T, [name]  levothyroxine (SYNTHROID, LEVOTHROID) 175 MCG tablet Take 1 tablet (175 mcg total) by mouth daily. 05/12/18   Angiulli, Lavon Paganini, [name]  metoprolol tartrate (LOPRESSOR) 25 MG tablet Take 0.5  tablets (12.5 mg total) by mouth daily. 08/16/18   Clegg, Amy D, NP  polyethylene glycol (MIRALAX / GLYCOLAX) packet Take 17 g by mouth daily as needed. For constipation 08/09/14   Love, Ivan Anchors, [name]  potassium chloride SA (K-DUR,KLOR-CON) 20 MEQ tablet Take 2 tablets (40 mEq total) by mouth daily. 09/21/18   Georgiana Shore, NP  pramipexole (MIRAPEX) 0.5 MG tablet Take 1 tablet (0.5 mg total) by mouth 2 (two) times daily. 05/12/18   Angiulli, Lavon Paganini, [name]  senna (SENOKOT) 8.6 MG TABS tablet Take 2 tablets (17.2 mg total) by mouth 2 (two) times daily. 05/12/18   Angiulli, Lavon Paganini, [name]  spironolactone (ALDACTONE) 25 MG tablet Take 1 tablet (25 mg total) by mouth daily. 10/29/18   Nahser, Wonda Cheng, MD  tamsulosin (FLOMAX) 0.4 MG CAPS capsule Take 1 capsule (0.4 mg total) by mouth daily. 05/12/18   Angiulli, Lavon Paganini, [name]  torsemide (DEMADEX) 20 MG tablet Take 3 tablets (60 mg total)  by mouth 2 (two) times daily. 09/03/18   Larey Dresser, MD  TOVIAZ 8 MG TB24 tablet Take 1 tablet (8 mg total) by mouth daily. 05/12/18   Angiulli, Lavon Paganini, [name]    Physical Exam: BP (!) 158/74 (BP Location: Right Arm)   Pulse 64   Temp 97.9 F (36.6 C) (Oral)   Resp (!) 24   Ht 6\' 1"  (1.854 m)   Wt 114.4 kg   SpO2 97%   BMI 33.27 kg/m   Exam:  . General: 72 y.o. year-old male well developed well nourished in no acute distress.  Alert and oriented x3. . Cardiovascular: Regular rate and rhythm with no rubs or gallops.  No thyromegaly or JVD noted.   [name] Respiratory: Clear to auscultation with no wheezes or rales. Good inspiratory effort. . Abdomen: Soft, suprapubic tenderness, nondistended with normal bowel sounds x4 quadrants. . Musculoskeletal:trace lower extremity edema. 2/4 pulses in all 4 extremities. . Skin: No ulcerative lesions noted or rashes, . Psychiatry: Mood is appropriate for condition and setting           Labs on Admission:  Basic Metabolic Panel: No results for input(s): NA, K, CL, CO2,  GLUCOSE, BUN, CREATININE, CALCIUM, MG, PHOS in the last 168 hours. Liver Function Tests: No results for input(s): AST, ALT, ALKPHOS, BILITOT, PROT, ALBUMIN in the last 168 hours. No results for input(s): LIPASE, AMYLASE in the last 168 hours. No results for input(s): AMMONIA in the last 168 hours. CBC: No results for input(s): WBC, NEUTROABS, HGB, HCT, MCV, PLT in the last 168 hours. Cardiac Enzymes: No results for input(s): CKTOTAL, CKMB, CKMBINDEX, TROPONINI in the last 168 hours.  BNP (last 3 results) Recent Labs    06/28/18 1952 08/16/18 0938 10/27/18 1013  BNP 319.2* 106.5* 38.1    ProBNP (last 3 results) No results for input(s): PROBNP in the last 8760 hours.  CBG: No results for input(s): GLUCAP in the last 168 hours.  Radiological Exams on Admission: No results found.  EKG: None  Assessment/Plan Present on Admission: . Lumbosacral spinal stenosis . Edema leg . Thoracic spondylosis with myelopathy . Hypothyroid . Chronic diastolic CHF (congestive heart failure) ([place]) . Stage 3 chronic kidney disease ([place]) . Urinary tract infection without hematuria . Benign prostatic hyperplasia with urinary retention . Poorly controlled type 2 diabetes mellitus with peripheral neuropathy ([place]) . Debility . Morbid obesity ([place]) . Benign essential HTN . Complicated urinary tract infection  Principal Problem:   Urinary tract infection without hematuria Active Problems:   Abnormality of gait   Lumbosacral spinal stenosis   OSA on CPAP   Edema leg   Thoracic spondylosis with myelopathy   Hypothyroid   Chronic diastolic CHF (congestive heart failure) (HCC)   Stage 3 chronic kidney disease (HCC)   Debility   S/P lumbar spinal fusion   Morbid obesity (HCC)   Benign essential HTN   Benign prostatic hyperplasia with urinary retention   Poorly controlled type 2 diabetes mellitus with peripheral neuropathy ([place])   Complicated urinary tract infection  1.  Urinary tract  infection with positive culture for Enterobacter and Pseudomonas patient requires IV antibiotics.  2.  Congestive heart failure stable continue current medicine  3.Obstructive sleep apnea patient will continue his CPAP per home  4.  Chronic low back pain secondary to thoracic spondylosis continue pain management  5.  Chronic chronic kidney disease stage IV.  His creatinine is 2.03 ,monitor and avoid nephrotoxic drugs  6.  BPH continue  current medicine  Severity of Illness: The appropriate patient status for this patient is INPATIENT. Inpatient status is judged to be reasonable and necessary in order to provide the required intensity of service to ensure the patient's safety. The patient's presenting symptoms, physical exam findings, and initial radiographic and laboratory data in the context of their chronic comorbidities is felt to place them at high risk for further clinical deterioration. Furthermore, it is not anticipated that the patient will be medically stable for discharge from the hospital within 2 midnights of admission. The following factors support the patient status of inpatient.   " The patient's presenting symptoms include dysuria. " The worrisome physical exam findings include Pseudomonas Enterobacter in urine culture requiring IV antibiotics. " The initial radiographic and laboratory data are worrisome because of Pseudomonas of urine culture. " The chronic co-morbidities include congestive heart failure COPD.   * I certify that at the point of admission it is my clinical judgment that the patient will require inpatient hospital care spanning beyond 2 midnights from the point of admission due to high intensity of service, high risk for further deterioration and high frequency of surveillance required.*    DVT prophylaxis: levonox  Code Status: full  Family Communication: Daughter-in-law at bedside  Disposition Plan: home  Consults called: Phone consult/curbside with  infectious disease Dr. Johnnye Sima  Admission status: inpatient    Cristal Deer MD [facility] Pager [phone]  If 7PM-7AM, please contact night-coverage [email or web address] Password [email or web address]  11/20/2018, 6:30 PM

## 2018-11-20 NOTE — Progress Notes (Signed)
Pt arrived from home, direct admit from PCP office. MD paged and made aware of pt arrival at the unit. Alert and oriented x4, VSS, on 2L of oxygen via [place]. Daughter in law at bedside. Call bell and personal belongings within reach, will continue to monitor.

## 2018-11-20 NOTE — Progress Notes (Signed)
Patient was placed on home CPAP with 2 L of O2 bleed in.   11/20/18 2241  BiPAP/CPAP/SIPAP  BiPAP/CPAP/SIPAP Pt Type Adult  Mask Type Full face mask  Mask Size Medium  Respiratory Rate 18 breaths/min  Flow Rate 2 lpm  BiPAP/CPAP/SIPAP CPAP  Patient Home Equipment Yes  Auto Titrate No

## 2018-11-21 ENCOUNTER — Inpatient Hospital Stay (HOSPITAL_COMMUNITY): Payer: Medicare Other

## 2018-11-21 DIAGNOSIS — I10 Essential (primary) hypertension: Secondary | ICD-10-CM

## 2018-11-21 DIAGNOSIS — Z87891 Personal history of nicotine dependence: Secondary | ICD-10-CM

## 2018-11-21 DIAGNOSIS — L8990 Pressure ulcer of unspecified site, unspecified stage: Secondary | ICD-10-CM

## 2018-11-21 DIAGNOSIS — R338 Other retention of urine: Secondary | ICD-10-CM

## 2018-11-21 DIAGNOSIS — B965 Pseudomonas (aeruginosa) (mallei) (pseudomallei) as the cause of diseases classified elsewhere: Secondary | ICD-10-CM

## 2018-11-21 DIAGNOSIS — E1165 Type 2 diabetes mellitus with hyperglycemia: Secondary | ICD-10-CM

## 2018-11-21 DIAGNOSIS — I130 Hypertensive heart and chronic kidney disease with heart failure and stage 1 through stage 4 chronic kidney disease, or unspecified chronic kidney disease: Secondary | ICD-10-CM

## 2018-11-21 DIAGNOSIS — E1122 Type 2 diabetes mellitus with diabetic chronic kidney disease: Secondary | ICD-10-CM

## 2018-11-21 DIAGNOSIS — B9689 Other specified bacterial agents as the cause of diseases classified elsewhere: Secondary | ICD-10-CM

## 2018-11-21 DIAGNOSIS — I5032 Chronic diastolic (congestive) heart failure: Secondary | ICD-10-CM

## 2018-11-21 DIAGNOSIS — G4733 Obstructive sleep apnea (adult) (pediatric): Secondary | ICD-10-CM

## 2018-11-21 DIAGNOSIS — E1142 Type 2 diabetes mellitus with diabetic polyneuropathy: Secondary | ICD-10-CM

## 2018-11-21 DIAGNOSIS — N184 Chronic kidney disease, stage 4 (severe): Secondary | ICD-10-CM

## 2018-11-21 DIAGNOSIS — N390 Urinary tract infection, site not specified: Principal | ICD-10-CM

## 2018-11-21 DIAGNOSIS — N401 Enlarged prostate with lower urinary tract symptoms: Secondary | ICD-10-CM

## 2018-11-21 LAB — URINALYSIS, ROUTINE W REFLEX MICROSCOPIC
BACTERIA UA: NONE SEEN
BILIRUBIN URINE: NEGATIVE
Glucose, UA: >=500 mg/dL — AB
Hgb urine dipstick: NEGATIVE
KETONES UR: NEGATIVE mg/dL
Nitrite: NEGATIVE
PROTEIN: NEGATIVE mg/dL
Specific Gravity, Urine: 1.009 (ref 1.005–1.030)
pH: 5 (ref 5.0–8.0)

## 2018-11-21 LAB — HEMOGLOBIN A1C
Hgb A1c MFr Bld: 11.3 % — ABNORMAL HIGH (ref 4.8–5.6)
MEAN PLASMA GLUCOSE: 277.61 mg/dL

## 2018-11-21 LAB — GLUCOSE, CAPILLARY
GLUCOSE-CAPILLARY: 424 mg/dL — AB (ref 70–99)
GLUCOSE-CAPILLARY: 434 mg/dL — AB (ref 70–99)
GLUCOSE-CAPILLARY: 398 mg/dL — AB (ref 70–99)
GLUCOSE-CAPILLARY: 197 mg/dL — AB (ref 70–99)
GLUCOSE-CAPILLARY: 239 mg/dL — AB (ref 70–99)

## 2018-11-21 MED ORDER — INSULIN ASPART 100 UNIT/ML ~~LOC~~ SOLN
0.0000 [IU] | Freq: Three times a day (TID) | SUBCUTANEOUS | Status: DC
  Administered 2018-11-21: 2 [IU] via SUBCUTANEOUS
  Administered 2018-11-21: 9 [IU] via SUBCUTANEOUS
  Administered 2018-11-22: 7 [IU] via SUBCUTANEOUS

## 2018-11-21 MED ORDER — INSULIN GLARGINE 100 UNIT/ML ~~LOC~~ SOLN
7.0000 [IU] | Freq: Every day | SUBCUTANEOUS | Status: DC
  Administered 2018-11-21: 7 [IU] via SUBCUTANEOUS
  Filled 2018-11-21 (×2): qty 0.07

## 2018-11-21 MED ORDER — ZOLPIDEM TARTRATE 5 MG PO TABS
5.0000 mg | ORAL_TABLET | Freq: Once | ORAL | Status: AC
  Administered 2018-11-21: 5 mg via ORAL
  Filled 2018-11-21: qty 1

## 2018-11-21 MED ORDER — INSULIN ASPART 100 UNIT/ML ~~LOC~~ SOLN: 0.0000 [IU] | Freq: Three times a day (TID) | SUBCUTANEOUS | Status: DC

## 2018-11-21 MED ORDER — INSULIN ASPART 100 UNIT/ML ~~LOC~~ SOLN
0.0000 [IU] | Freq: Every day | SUBCUTANEOUS | Status: DC
  Administered 2018-11-21: 2 [IU] via SUBCUTANEOUS

## 2018-11-21 MED ORDER — INSULIN ASPART 100 UNIT/ML ~~LOC~~ SOLN
3.0000 [IU] | Freq: Three times a day (TID) | SUBCUTANEOUS | Status: DC
  Administered 2018-11-21 – 2018-11-22 (×3): 3 [IU] via SUBCUTANEOUS

## 2018-11-21 MED ORDER — INSULIN ASPART 100 UNIT/ML ~~LOC~~ SOLN
5.0000 [IU] | Freq: Once | SUBCUTANEOUS | Status: AC
  Administered 2018-11-21: 5 [IU] via SUBCUTANEOUS

## 2018-11-21 MED ORDER — ENOXAPARIN SODIUM 40 MG/0.4ML ~~LOC~~ SOLN
40.0000 mg | SUBCUTANEOUS | Status: DC
  Administered 2018-11-21: 40 mg via SUBCUTANEOUS
  Filled 2018-11-21: qty 0.4

## 2018-11-21 NOTE — Consult Note (Addendum)
[facility]ious [facility]    Date of Admission:  11/20/2018   Total days of antibiotics: 1 cefepime               Reason for Consult: UTI    Referring Provider: Rai   Assessment: UTI DM2  Plan: 1. Continue cefepime for 7 days (provided he has normal renal imaging) 2. Renal imaging pending 3. Could place mid-line or central line (as appropriate for his CKD) and send pt home 4. F/u with PCP  I spoke with Dr Doran Stabler and Dr Geryl Councilman about his sensi and his anbx.  Hard copy available in his hard chart at desk.  Available as needed.   Thank you so much for this interesting consult,  Principal Problem:   Complicated urinary tract infection Active Problems:   OSA on CPAP   Hypothyroid   Chronic diastolic CHF (congestive heart failure) (HCC)   Stage 3 chronic kidney disease (HCC)   Morbid obesity (HCC)   Benign essential HTN   Benign prostatic hyperplasia with urinary retention   Poorly controlled type 2 diabetes mellitus with peripheral neuropathy (HCC)   Pressure injury of skin   . aspirin EC  81 mg Oral Daily  . enoxaparin (LOVENOX) injection  40 mg Subcutaneous Q24H  . escitalopram  10 mg Oral Daily  . fesoterodine  8 mg Oral Daily  . hydrALAZINE  75 mg Oral TID  . insulin aspart  0-5 Units Subcutaneous QHS  . insulin aspart  0-9 Units Subcutaneous TID WC  . insulin aspart  3 Units Subcutaneous TID WC  . insulin aspart  5 Units Subcutaneous Once  . insulin glargine  7 Units Subcutaneous QHS  . levothyroxine  175 mcg Oral QAC breakfast  . metoprolol tartrate  12.5 mg Oral Daily  . polyethylene glycol  17 g Oral Daily  . potassium chloride SA  40 mEq Oral Daily  . pramipexole  0.5 mg Oral BID  . senna  2 tablet Oral BID  . sodium chloride flush  3 mL Intravenous Q12H  . spironolactone  25 mg Oral Daily  . tamsulosin  0.4 mg Oral Daily  . torsemide  60 mg Oral BID    HPI: Travis [name] is a 72 y.o. male with hx of CHF, OSA, HTN, CKD IV,  DM2 (for 2 years?), who was seen by his PCP over last week for sx of UTI (dysuria, incontinence and intermittent fever).  He had UCx that showed enterobacter and pseudomonas. There were no po options for the pt and he was sent to hospital for admission after his PCP and I discussed the pt.  He was direct admitted on 11-23 and started on cefepime.  He has been afebrile in hospital but has been hyperglycemic. His UA today showed 0-5 SE, 6-10 WBC, mod LE, Glc > 500.  He feels better since adm- less abd pain, less nausea.   Review of Systems: Review of Systems  Constitutional: Negative for chills and fever.  Eyes: Negative for blurred vision.  Gastrointestinal: Positive for nausea. Negative for constipation and diarrhea.  Genitourinary: Positive for dysuria and frequency.  Musculoskeletal: Positive for back pain.  Neurological: Negative for sensory change.    Past Medical History:  Diagnosis Date  . Cervical myelopathy (HCC)    with myelomalacia at C5-6 and C3-4  . CHF (congestive heart failure) ([place])   . Constipation - functional    controlled with miralax  .  COPD (chronic obstructive pulmonary disease) (HCC)    mild  . DJD (degenerative joint disease)    WHOLE SPINE  . Dyspnea   . Gait disorder   . Heart murmur    years ago   . Hypertension   . Hypothyroidism    Low d/t treatment of hyperthyroidism  . Lumbosacral spinal stenosis 03/13/2014  . On home oxygen therapy    "2.5L all the time" (06/29/2018)  . Peripheral neuropathy   . Pneumonia   . PONV (postoperative nausea and vomiting)   . RBBB   . Renal disorder    acute kidney failure  . Restless legs syndrome 03/14/2015  . RLS (restless legs syndrome)   . Sleep apnea    DX  2009/2010 study done at [facility]  . 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 Type II diabetes mellitus (HCC)    diet controlled    Social History   Tobacco Use  . Smoking status: Former Smoker    Packs/day: 2.00    Years: 20.00    Pack years: 40.00     Types: Cigarettes    Last attempt to quit: 06/28/1985    Years since quitting: 33.4  . Smokeless tobacco: Former Systems developer    Quit date: 06/28/1985  Substance Use Topics  . Alcohol use: No  . Drug use: No    Family History  Problem Relation Age of Onset  . Polycythemia Mother   . Diabetes Father   . Hypertension Father   . Heart disease Father   . CAD Unknown        1 of his 4 brothers and father has CAD     Medications:  Scheduled: . aspirin EC  81 mg Oral Daily  . enoxaparin (LOVENOX) injection  40 mg Subcutaneous Q24H  . escitalopram  10 mg Oral Daily  . fesoterodine  8 mg Oral Daily  . hydrALAZINE  75 mg Oral TID  . insulin aspart  0-5 Units Subcutaneous QHS  . insulin aspart  0-9 Units Subcutaneous TID WC  . insulin aspart  3 Units Subcutaneous TID WC  . insulin aspart  5 Units Subcutaneous Once  . insulin glargine  7 Units Subcutaneous QHS  . levothyroxine  175 mcg Oral QAC breakfast  . metoprolol tartrate  12.5 mg Oral Daily  . polyethylene glycol  17 g Oral Daily  . potassium chloride SA  40 mEq Oral Daily  . pramipexole  0.5 mg Oral BID  . senna  2 tablet Oral BID  . sodium chloride flush  3 mL Intravenous Q12H  . spironolactone  25 mg Oral Daily  . tamsulosin  0.4 mg Oral Daily  . torsemide  60 mg Oral BID    Abtx:  Anti-infectives (From admission, onward)   Start     Dose/Rate Route Frequency Ordered Stop   11/20/18 2200  ceFEPIme (MAXIPIME) 1 g in sodium chloride 0.9 % 100 mL IVPB  Status:  Discontinued    Note to Pharmacy:  pls change to  renal dose   1 g 200 mL/hr over 30 Minutes Intravenous Every 12 hours 11/20/18 1939 11/20/18 1947   11/20/18 2100  ceFEPIme (MAXIPIME) 2 g in sodium chloride 0.9 % 100 mL IVPB    Note to Pharmacy:  pls change to  renal dose   2 g 200 mL/hr over 30 Minutes Intravenous Every 24 hours 11/20/18 1947          OBJECTIVE: Blood pressure (!) 143/63, pulse  66, temperature 98.6 F (37 C), temperature source Oral, resp.  rate 18, height _0  (1.854 m), weight 116.9 kg, SpO2 95 %.  Physical Exam  Constitutional: He is oriented to person, place, and time. He appears well-developed and well-nourished.  HENT:  Mouth/Throat: No oropharyngeal exudate.  Eyes: Pupils are equal, round, and reactive to light. EOM are normal.  Neck: Normal range of motion. Neck supple.  Cardiovascular: Normal rate, regular rhythm and normal heart sounds.  He has anasarca, on his back  Pulmonary/Chest: Effort normal and breath sounds normal.  Abdominal: Soft. Bowel sounds are normal. There is no CVA tenderness.  Musculoskeletal: He exhibits edema.  Lymphadenopathy:    He has no cervical adenopathy.  Neurological: He is alert and oriented to person, place, and time.  Psychiatric: He has a normal mood and affect.  he has no diabetic foot ulcers and his sensation is grossly normal.   Lab Results Results for orders placed or performed during the hospital encounter of 11/20/18 (from the past 48 hour(s))  CBC     Status: Abnormal   Collection Time: 11/20/18  7:25 PM  Result Value Ref Range   WBC 6.8 4.0 - 10.5 K/uL   RBC 4.78 4.22 - 5.81 MIL/uL   Hemoglobin 13.2 13.0 - 17.0 g/dL   HCT 42.6 39.0 - 52.0 %   MCV 89.1 80.0 - 100.0 fL   MCH 27.6 26.0 - 34.0 pg   MCHC 31.0 30.0 - 36.0 g/dL   RDW 15.7 (H) 11.5 - 15.5 %   Platelets 207 150 - 400 K/uL   nRBC 0.0 0.0 - 0.2 %    Comment: Performed at [facility], [place] [address], [place], [place] [address]  Comprehensive metabolic panel     Status: Abnormal   Collection Time: 11/20/18  7:25 PM  Result Value Ref Range   Sodium 132 (L) 135 - 145 mmol/L   Potassium 4.8 3.5 - 5.1 mmol/L   Chloride 93 (L) 98 - 111 mmol/L   CO2 31 22 - 32 mmol/L   Glucose, Bld 386 (H) 70 - 99 mg/dL   BUN 56 (H) 8 - 23 mg/dL   Creatinine, Ser 2.03 (H) 0.61 - 1.24 mg/dL   Calcium 9.3 8.9 - 10.3 mg/dL   Total Protein 8.0 6.5 - 8.1 g/dL   Albumin 3.7 3.5 - 5.0 g/dL   AST 17 15 - 41 U/L   ALT 19 0 -  44 U/L   Alkaline Phosphatase 89 38 - 126 U/L   Total Bilirubin 0.6 0.3 - 1.2 mg/dL   GFR calc non Af Amer 31 (L) >60 mL/min   GFR calc Af Amer 36 (L) >60 mL/min    Comment: (NOTE) The eGFR has been calculated using the CKD EPI equation. This calculation has not been validated in all clinical situations. eGFR's persistently <60 mL/min signify possible Chronic Kidney Disease.    Anion gap 8 5 - 15    Comment: Performed at [facility] [address], [place], [place] [address]  Glucose, capillary     Status: Abnormal   Collection Time: 11/20/18  8:20 PM  Result Value Ref Range   Glucose-Capillary 367 (H) 70 - 99 mg/dL  Culture, blood (routine x 2)     Status: None (Preliminary result)   Collection Time: 11/21/18  6:15 AM  Result Value Ref Range   Specimen Description BLOOD RIGHT ANTECUBITAL    Special Requests      BOTTLES DRAWN  AEROBIC ONLY Blood Culture adequate volume   Culture      NO GROWTH < 12 HOURS Performed at [facility] [address], [place], [place] [address]    Report Status PENDING   Culture, blood (routine x 2)     Status: None (Preliminary result)   Collection Time: 11/21/18  6:20 AM  Result Value Ref Range   Specimen Description BLOOD RIGHT ANTECUBITAL    Special Requests      BOTTLES DRAWN AEROBIC ONLY Blood Culture adequate volume   Culture      NO GROWTH < 12 HOURS Performed at [facility] [address] [place], [place] [address]    Report Status PENDING   Urinalysis, Routine w reflex microscopic     Status: Abnormal   Collection Time: 11/21/18  6:39 AM  Result Value Ref Range   Color, Urine COLORLESS (A) YELLOW   APPearance CLEAR CLEAR   Specific Gravity, Urine 1.009 1.005 - 1.030   pH 5.0 5.0 - 8.0   Glucose, UA >=500 (A) NEGATIVE mg/dL   Hgb urine dipstick NEGATIVE NEGATIVE   Bilirubin Urine NEGATIVE NEGATIVE   Ketones, ur NEGATIVE NEGATIVE mg/dL   Protein, ur NEGATIVE NEGATIVE mg/dL   Nitrite NEGATIVE NEGATIVE    Leukocytes, UA MODERATE (A) NEGATIVE   WBC, UA 6-10 0 - 5 WBC/hpf   Bacteria, UA NONE SEEN NONE SEEN   Squamous Epithelial / LPF 0-5 0 - 5   Mucus PRESENT    Budding Yeast PRESENT    Hyaline Casts, UA PRESENT     Comment: Performed at [facility] [address] [place], [place] [address]  Glucose, capillary     Status: Abnormal   Collection Time: 11/21/18  7:41 AM  Result Value Ref Range   Glucose-Capillary 424 (H) 70 - 99 mg/dL  Glucose, capillary     Status: Abnormal   Collection Time: 11/21/18 11:26 AM  Result Value Ref Range   Glucose-Capillary 434 (H) 70 - 99 mg/dL  Hemoglobin A1c     Status: Abnormal   Collection Time: 11/21/18 12:28 PM  Result Value Ref Range   Hgb A1c MFr Bld 11.3 (H) 4.8 - 5.6 %    Comment: (NOTE) Pre diabetes:          5.7%-6.4% Diabetes:              >6.4% Glycemic control for   <7.0% adults with diabetes    Mean Plasma Glucose 277.61 mg/dL    Comment: Performed at [facility], [place] [address] [place], [place] [address]  Glucose, capillary     Status: Abnormal   Collection Time: 11/21/18  1:13 PM  Result Value Ref Range   Glucose-Capillary 398 (H) 70 - 99 mg/dL      Component Value Date/Time   SDES BLOOD RIGHT ANTECUBITAL 11/21/2018 0620   SPECREQUEST  11/21/2018 0620    BOTTLES DRAWN AEROBIC ONLY Blood Culture adequate volume   CULT  11/21/2018 0620    NO GROWTH < 12 HOURS Performed at [facility], [place] [address] [place],  [address]    REPTSTATUS PENDING 11/21/2018 1165   No results found. Recent Results (from the past 240 hour(s))  Culture, blood (routine x 2)     Status: None (Preliminary result)   Collection Time: 11/21/18  6:15 AM  Result Value Ref Range Status   Specimen Description BLOOD RIGHT ANTECUBITAL  Final   Special Requests  Final    BOTTLES DRAWN AEROBIC ONLY Blood Culture adequate volume   Culture   Final    NO GROWTH < 12 HOURS Performed at [facility], [place] [address],  [place], [place] [address]    Report Status PENDING  Incomplete  Culture, blood (routine x 2)     Status: None (Preliminary result)   Collection Time: 11/21/18  6:20 AM  Result Value Ref Range Status   Specimen Description BLOOD RIGHT ANTECUBITAL  Final   Special Requests   Final    BOTTLES DRAWN AEROBIC ONLY Blood Culture adequate volume   Culture   Final    NO GROWTH < 12 HOURS Performed at [facility], [place] [address], [place], [place] [address]    Report Status PENDING  Incomplete    Microbiology: Recent Results (from the past 240 hour(s))  Culture, blood (routine x 2)     Status: None (Preliminary result)   Collection Time: 11/21/18  6:15 AM  Result Value Ref Range Status   Specimen Description BLOOD RIGHT ANTECUBITAL  Final   Special Requests   Final    BOTTLES DRAWN AEROBIC ONLY Blood Culture adequate volume   Culture   Final    NO GROWTH < 12 HOURS Performed at [facility], [address], [place], [place] [address]    Report Status PENDING  Incomplete  Culture, blood (routine x 2)     Status: None (Preliminary result)   Collection Time: 11/21/18  6:20 AM  Result Value Ref Range Status   Specimen Description BLOOD RIGHT ANTECUBITAL  Final   Special Requests   Final    BOTTLES DRAWN AEROBIC ONLY Blood Culture adequate volume   Culture   Final    NO GROWTH < 12 HOURS Performed at [facility], [place] [address] [place], [place] [address]    Report Status PENDING  Incomplete    Radiographs and labs were personally reviewed by me.   Bobby Rumpf, MD [facility] [phone] 11/21/2018, 4:51 PM

## 2018-11-21 NOTE — Evaluation (Signed)
Occupational Therapy Evaluation Patient Details Name: Travis [name] MRN: [id number] DOB: [birth year] Today's Date: 11/21/2018    History of Present Illness Pt is a 72 y.o. M with significant PMH of diastolic CHF, COPD, OSA on home BIPAP, hypertension, back surgery (4/19) who presents with recurrent UTI.    Clinical Impression   PTA, pt was living with his wife who assisted pt with ADLs and functional transfers. Pt currently requiring Min A for UB ADLs, Max A for LB ADLs, and Max A for sit<>stand with RW. Pt highly motivated and wants to maintain strength; providing pt with therband exercises for BUEs and pt performed 10 reps while seated in recliner. Pt would benefit from further acute OT to facilitate safe dc. Recommend dc to home with HHOT for further OT to optimize safety, independence with ADLs, and return to PLOF.      Follow Up Recommendations  Home health OT;Supervision/Assistance - 24 hour    Equipment Recommendations  None recommended by OT    Recommendations for Other Services PT consult     Precautions / Restrictions Precautions Precautions: Fall Restrictions Weight Bearing Restrictions: No      Mobility Bed Mobility               General bed mobility comments: In recliner upon arrival  Transfers Overall transfer level: Needs assistance Equipment used: Rolling walker (2 wheeled) Transfers: Sit to/from Stand Sit to Stand: Max assist         General transfer comment: Max A to power up into standing and weight shift forward.    Balance Overall balance assessment: Needs assistance Sitting-balance support: Bilateral upper extremity supported;Feet supported Sitting balance-Leahy Scale: Fair     Standing balance support: Bilateral upper extremity supported Standing balance-Leahy Scale: Poor                             ADL either performed or assessed with clinical judgement   ADL Overall ADL's : Needs  assistance/impaired Eating/Feeding: Supervision/ safety;Set up;Sitting   Grooming: Supervision/safety;Set up;Sitting   Upper Body Bathing: Minimal assistance;Sitting   Lower Body Bathing: Maximal assistance;Sit to/from stand   Upper Body Dressing : Minimal assistance;Sitting   Lower Body Dressing: Maximal assistance;Sit to/from stand   Toilet Transfer: Maximal assistance;+2 for physical assistance;RW           Functional mobility during ADLs: Maximal assistance(sit<>stand only) General ADL Comments: Pt with poor strength, balance, and activity tolerance. Pt highly motivated to participate     Vision         Perception     Praxis      Pertinent Vitals/Pain Pain Assessment: Faces Faces Pain Scale: Hurts little more Pain Location: sacral wound when sitting in chair Pain Descriptors / Indicators: Discomfort;Grimacing Pain Intervention(s): Monitored during session;Limited activity within patient's tolerance;Repositioned     Hand Dominance Right   Extremity/Trunk Assessment Upper Extremity Assessment Upper Extremity Assessment: Generalized weakness   Lower Extremity Assessment Lower Extremity Assessment: Defer to PT evaluation RLE Deficits / Details: Grossly 4/5 except hip flexion 3/5 LLE Deficits / Details: Grossly 4/5 except hip flexion 3/5       Communication Communication Communication: No difficulties   Cognition Arousal/Alertness: Awake/alert Behavior During Therapy: WFL for tasks assessed/performed Overall Cognitive Status: Within Functional Limits for tasks assessed  General Comments  Daughter present during 38 of evalution    Exercises Exercises: General Upper Extremity General Exercises - Upper Extremity Shoulder Flexion: AROM;Both;10 reps;Seated;Theraband Theraband Level (Shoulder Flexion): Level 2 (Red) Shoulder Extension: AROM;Both;10 reps;Seated;Theraband Theraband Level (Shoulder  Extension): Level 2 (Red) Shoulder Horizontal ABduction: AROM;Both;10 reps;Seated;Theraband Theraband Level (Shoulder Horizontal Abduction): Level 2 (Red) Shoulder Horizontal ADduction: AROM;Both;10 reps;Seated;Theraband Theraband Level (Shoulder Horizontal Adduction): Level 2 (Red) Elbow Flexion: AROM;Both;10 reps;Seated;Theraband Theraband Level (Elbow Flexion): Level 2 (Red) Elbow Extension: AROM;Both;10 reps;Seated;Theraband Theraband Level (Elbow Extension): Level 2 (Red)   Shoulder Instructions      Home Living Family/patient expects to be discharged to:: Private residence Living Arrangements: Spouse/significant other Available Help at Discharge: Family;Available 24 hours/day;Personal care attendant Type of Home: House Home Access: Ramped entrance     Home Layout: One level     Bathroom Shower/Tub: Tub/shower unit;Walk-in shower;Curtain   Bathroom Toilet: Handicapped height Bathroom Accessibility: Yes   Home Equipment: Environmental consultant - 2 wheels;Walker - 4 wheels;Cane - single point;Bedside commode;Wheelchair - manual;Shower seat - built in;Grab bars - tub/shower;Hospital bed;Other (comment)(slideboard)          Prior Functioning/Environment Level of Independence: Needs assistance  Gait / Transfers Assistance Needed: Wheelchair for mobility, bed mobility and stand pivot transfers with assistance.  ADL's / Homemaking Assistance Needed: Pt wife assists with ADL's including bathing and dressing            OT Problem List: Decreased strength;Decreased range of motion;Decreased activity tolerance;Impaired balance (sitting and/or standing);Decreased safety awareness;Decreased knowledge of use of DME or AE;Decreased knowledge of precautions      OT Treatment/Interventions: Self-care/ADL training;Therapeutic exercise;Energy conservation;DME and/or AE instruction;Therapeutic activities;Patient/family education    OT Goals(Current goals can be found in the care plan section) Acute  Rehab OT Goals Patient Stated Goal: "get stronger." OT Goal Formulation: With patient Time For Goal Achievement: 12/05/18 Potential to Achieve Goals: Good ADL Goals Pt Will Perform Upper Body Dressing: with set-up;with supervision;sitting;with caregiver independent in assisting Pt Will Perform Lower Body Dressing: with min assist;with adaptive equipment;sit to/from stand;with caregiver independent in assisting Pt Will Transfer to Toilet: with mod assist;stand pivot transfer;bedside commode Pt/caregiver will Perform Home Exercise Program: Increased strength;With theraband;With Supervision  OT Frequency: Min 2X/week   Barriers to D/C:            Co-evaluation              AM-PAC OT "6 Clicks" Daily Activity     Outcome Measure Help from another person eating meals?: None Help from another person taking care of personal grooming?: None Help from another person toileting, which includes using toliet, bedpan, or urinal?: A Lot Help from another person bathing (including washing, rinsing, drying)?: A Lot Help from another person to put on and taking off regular upper body clothing?: A Little Help from another person to put on and taking off regular lower body clothing?: A Lot 6 Click Score: 17   End of Session Equipment Utilized During Treatment: Rolling walker Nurse Communication: Mobility status  Activity Tolerance: Patient tolerated treatment well Patient left: in chair;with call bell/phone within reach;with family/visitor present  OT Visit Diagnosis: Unsteadiness on feet (R26.81);Other abnormalities of gait and mobility (R26.89);Muscle weakness (generalized) (M62.81)                Time: 1525-1550 OT Time Calculation (min): 25 min Charges:  OT General Charges $OT Visit: 1 Visit OT Evaluation $OT Eval Moderate Complexity: 1 Mod OT Treatments $Self Care/Home Management : 8-22 mins  [place], OTR/L Acute Rehab Pager: [phone] Office:  [place] 11/21/2018, 5:10 PM

## 2018-11-21 NOTE — Progress Notes (Signed)
[facility]                                                                              Patient Demographics  Travis [name], is a 72 y.o. male, DOB - 1946/01/31, QDI:[id number]  Admit date - 11/20/2018   Admitting Physician Cristal Deer, MD  Outpatient Primary MD for the patient is [name], MD  Outpatient specialists:   LOS - 1  days   Medical records reviewed and are as summarized below:    No chief complaint on file.      Brief summary   Patient is a 72 year old male with diastolic CHF, COPD, OSA on home BiPAP, hypertension, hypothyroidism presented from PCPs office, Dr. Marisue Humble.  Patient was called with positive urine culture growing Pseudomonas and Enterobacter, only sensitive for cefepime, gentamicin and tobramycin.  Patient had complained of dysuria, lower abdominal pain and off-and-on fever.  Patient has recurrent episodes of UTI and has been seen recently by urology this week, found to have incontinence.  Per patient's family, he gets easily fluid overloaded due to his CHF and has a history of intubation 3 years ago.   Assessment & Plan    Principal Problem:   Complicated urinary tract infection with urine culture positive for Enterobacter, Pseudomonas -Repeated urine culture and blood cultures here however patient had already received cefepime -He has a history of recurrent UTIs and has been on antibiotics off and on, will check CT abdomen to rule out pyelonephritis/renal abscess -For now continue IV cefepime, I will call Dr. Andrew Au office in the morning to get formal results of his cultures.  Will likely need PICC line. -Continue IV cefepime, discussed with ID, Dr. Johnnye Sima   Active Problems:   OSA on CPAP -Continue CPAP at night  Poorly controlled type 2 diabetes mellitus with hypoglycemia, NIDDM -CBGs in 400s, obtain hemoglobin A1c, placed on Lantus 7 units daily, NovoLog meal coverage 3 units 3 times daily AC, continue sliding  scale insulin    Hypothyroid -Continue Synthroid    Chronic diastolic CHF (congestive heart failure) (HCC) -Currently euvolemic, continue torsemide, spironolactone    Stage 3 chronic kidney disease (HCC) -Baseline creatinine 1.6-2.0 -Currently creatinine 2.0, follow closely   obesity (HCC) -BMI 34, diet and weight control recommended    Benign essential HTN -BP currently stable    Benign prostatic hyperplasia with urinary retention -Continue Flomax, Toviaz    Pressure injury of skin Small full-thickness wound to sacrum 1x 1x 0.5 cm Wound care per nursing  Code Status: Full CODE STATUS DVT Prophylaxis: Lovenox Family Communication: Discussed in detail with the patient, all imaging results, lab results explained to the patient and daughter-in-law   Disposition Plan: Hopefully DC next 24 hours  Time Spent in minutes   25 minutes  Procedures:  None  Consultants:   Infectious disease  Antimicrobials:   IV cefepime 11/23   Medications  Scheduled Meds: . aspirin EC  81 mg Oral Daily  . enoxaparin (LOVENOX) injection  30 mg Subcutaneous Q24H  . escitalopram  10 mg Oral Daily  . fesoterodine  8 mg Oral Daily  . glimepiride  2 mg Oral  Q breakfast  . hydrALAZINE  75 mg Oral TID  . insulin aspart  0-15 Units Subcutaneous TID WC  . insulin aspart  3 Units Subcutaneous TID WC  . insulin glargine  7 Units Subcutaneous QHS  . levothyroxine  175 mcg Oral QAC breakfast  . metoprolol tartrate  12.5 mg Oral Daily  . polyethylene glycol  17 g Oral Daily  . potassium chloride SA  40 mEq Oral Daily  . pramipexole  0.5 mg Oral BID  . senna  2 tablet Oral BID  . sodium chloride flush  3 mL Intravenous Q12H  . spironolactone  25 mg Oral Daily  . tamsulosin  0.4 mg Oral Daily  . torsemide  60 mg Oral BID   Continuous Infusions: . sodium chloride    . ceFEPime (MAXIPIME) IV 2 g (11/20/18 2259)   PRN Meds:.sodium chloride, ondansetron **OR** ondansetron (ZOFRAN) IV, sodium  chloride flush   Antibiotics   Anti-infectives (From admission, onward)   Start     Dose/Rate Route Frequency Ordered Stop   11/20/18 2200  ceFEPIme (MAXIPIME) 1 g in sodium chloride 0.9 % 100 mL IVPB  Status:  Discontinued    Note to Pharmacy:  pls change to  renal dose   1 g 200 mL/hr over 30 Minutes Intravenous Every 12 hours 11/20/18 1939 11/20/18 1947   11/20/18 2100  ceFEPIme (MAXIPIME) 2 g in sodium chloride 0.9 % 100 mL IVPB    Note to Pharmacy:  pls change to  renal dose   2 g 200 mL/hr over 30 Minutes Intravenous Every 24 hours 11/20/18 1947          Subjective:   Travis [name] was seen and examined today.  No fevers this morning, has mild lower abdominal pain, no nausea or vomiting. Patient denies dizziness, chest pain, shortness of breath, new weakness, numbess, tingling. No acute events overnight.    Objective:   Vitals:   11/20/18 1714 11/20/18 2017 11/21/18 0414 11/21/18 0740  BP:  (!) 142/61 (!) 164/82 (!) 143/63  Pulse:  64 75 66  Resp:  18 20 18   Temp:  97.7 F (36.5 C) 98.2 F (36.8 C) 98.6 F (37 C)  TempSrc:  Oral Oral Oral  SpO2:  96% 90% 95%  Weight: 114.4 kg 116.9 kg    Height: 6\' 1"  (1.854 m)       Intake/Output Summary (Last 24 hours) at 11/21/2018 1044 Last data filed at 11/21/2018 0600 Gross per 24 hour  Intake 460 ml  Output 2100 ml  Net -1640 ml     Wt Readings from Last 3 Encounters:  11/20/18 116.9 kg  10/27/18 124.8 kg  09/21/18 123.2 kg     Exam  General: Alert and oriented x 3, NAD  Eyes:  HEENT:  Atraumatic, normocephalic, normal oropharynx  Cardiovascular: S1-S2 clear, RRR  Respiratory: Clear to auscultation bilaterally, no wheezing, rales or rhonchi  Gastrointestinal: Soft, nontender, nondistended, + bowel sounds  Ext: no pedal edema bilaterally  Neuro: No new FND's  Musculoskeletal: No digital cyanosis, clubbing  Skin: Chronic venous stasis changes to bilateral LE, small full-thickness wound to  sacrum  Psych: Normal affect and demeanor, alert and oriented x3    Data Reviewed:  I have personally reviewed following labs and imaging studies  Micro Results No results found for this or any previous visit (from the past 240 hour(s)).  Radiology Reports No results found.  Lab Data:  CBC: Recent Labs  Lab 11/20/18 1925  WBC  6.8  HGB 13.2  HCT 42.6  MCV 89.1  PLT 751   Basic Metabolic Panel: Recent Labs  Lab 11/20/18 1925  NA 132*  K 4.8  CL 93*  CO2 31  GLUCOSE 386*  BUN 56*  CREATININE 2.03*  CALCIUM 9.3   GFR: Estimated Creatinine Clearance: 43.4 mL/min (A) (by C-G formula based on SCr of 2.03 mg/dL (H)). Liver Function Tests: Recent Labs  Lab 11/20/18 1925  AST 17  ALT 19  ALKPHOS 89  BILITOT 0.6  PROT 8.0  ALBUMIN 3.7   No results for input(s): LIPASE, AMYLASE in the last 168 hours. No results for input(s): AMMONIA in the last 168 hours. Coagulation Profile: No results for input(s): INR, PROTIME in the last 168 hours. Cardiac Enzymes: No results for input(s): CKTOTAL, CKMB, CKMBINDEX, TROPONINI in the last 168 hours. BNP (last 3 results) No results for input(s): PROBNP in the last 8760 hours. HbA1C: No results for input(s): HGBA1C in the last 72 hours. CBG: Recent Labs  Lab 11/20/18 2020 11/21/18 0741  GLUCAP 367* 424*   Lipid Profile: No results for input(s): CHOL, HDL, LDLCALC, TRIG, CHOLHDL, LDLDIRECT in the last 72 hours. Thyroid Function Tests: No results for input(s): TSH, T4TOTAL, FREET4, T3FREE, THYROIDAB in the last 72 hours. Anemia Panel: No results for input(s): VITAMINB12, FOLATE, FERRITIN, TIBC, IRON, RETICCTPCT in the last 72 hours. Urine analysis:    Component Value Date/Time   COLORURINE COLORLESS (A) 11/21/2018 0639   APPEARANCEUR CLEAR 11/21/2018 0639   LABSPEC 1.009 11/21/2018 0639   PHURINE 5.0 11/21/2018 0639   GLUCOSEU >=500 (A) 11/21/2018 0639   HGBUR NEGATIVE 11/21/2018 [place]  11/21/2018 0639   KETONESUR NEGATIVE 11/21/2018 0639   PROTEINUR NEGATIVE 11/21/2018 0639   UROBILINOGEN 0.2 08/22/2015 1820   NITRITE NEGATIVE 11/21/2018 0639   LEUKOCYTESUR MODERATE (A) 11/21/2018 0258     Dink Creps M.D. [facility] 11/21/2018, 10:44 AM  Pager: [phone] Between 7am to 7pm - call Pager - [phone]  After 7pm go to [email or web address] - password [email or web address]  Call night coverage person covering after 7pm

## 2018-11-21 NOTE — Evaluation (Signed)
Physical Therapy Evaluation Patient Details Name: Travis [name] MRN: [id number] DOB: 06/19/1946 Today's Date: 11/21/2018   History of Present Illness  Pt is a 72 y.o. M with significant PMH of diastolic CHF, COPD, OSA on home BIPAP, hypertension, back surgery (4/19) who presents with recurrent UTI.   Clinical Impression  Pt admitted with above diagnosis. Pt currently with functional limitations due to the deficits listed below (see PT Problem List). Prior to admission, patient uses wheelchair for mobility, performs transfers with assistance, and requires assistance for ADL's. Has been receiving HHPT/OT. Has 24/7 assist/supervision. On PT evaluation, patient requiring two person maximal assistance to stand but able to transfer bed to chair with walker and min assist. Presents with weakness and decreased skin integrity secondary to sacral wound. Ordered geo mat cushion and reviewed pressure relieving techniques. Pt will benefit from skilled PT to increase their independence and safety with mobility to allow discharge to the venue listed below.       Follow Up Recommendations Home health PT;Supervision for mobility/OOB    Equipment Recommendations  None recommended by PT    Recommendations for Other Services OT consult     Precautions / Restrictions Precautions Precautions: Fall Restrictions Weight Bearing Restrictions: No      Mobility  Bed Mobility Overal bed mobility: Needs Assistance Bed Mobility: Sit to Supine       Sit to supine: Mod assist   General bed mobility comments: mod assist for BLE management  Transfers Overall transfer level: Needs assistance Equipment used: Rolling walker (2 wheeled) Transfers: Sit to/from Stand Sit to Stand: Max assist;+2 physical assistance         General transfer comment: Use of momentum to transfer with maxA + 2 for initiation. Tactile cueing for hip extension activation. Able to achieve upright with significant  effort  Ambulation/Gait Ambulation/Gait assistance: Min assist   Assistive device: Rolling walker (2 wheeled) Gait Pattern/deviations: Trunk flexed;Decreased stride length;Step-to pattern Gait velocity: decreased Gait velocity interpretation: <1.31 ft/sec, indicative of household ambulator General Gait Details: Pt taking pivotal steps and side steps to return back to bed from chair. Min assist for steadying. Very slow but no overt LOB  Stairs            Wheelchair Mobility    Modified Rankin (Stroke Patients Only)       Balance Overall balance assessment: Needs assistance Sitting-balance support: Bilateral upper extremity supported;Feet supported Sitting balance-Leahy Scale: Fair     Standing balance support: Bilateral upper extremity supported Standing balance-Leahy Scale: Poor                               Pertinent Vitals/Pain Pain Assessment: Faces Faces Pain Scale: Hurts little more Pain Location: sacral wound when sitting in chair Pain Descriptors / Indicators: Discomfort;Grimacing Pain Intervention(s): Repositioned    Home Living Family/patient expects to be discharged to:: Private residence Living Arrangements: Spouse/significant other Available Help at Discharge: Family;Available 24 hours/day;Personal care attendant Type of Home: House Home Access: Ramped entrance     Home Layout: One level Home Equipment: [place] - 2 wheels;Walker - 4 wheels;Cane - single point;Bedside commode;Wheelchair - manual;Shower seat - built in;Grab bars - tub/shower;Hospital bed;Other (comment)(slideboard)      Prior Function Level of Independence: Needs assistance   Gait / Transfers Assistance Needed: Wheelchair for mobility, bed mobility and stand pivot transfers with assistance.   ADL's / Homemaking Assistance Needed: Pt wife assists with ADL's including bathing  and dressing        Hand Dominance   Dominant Hand: Right    Extremity/Trunk Assessment    Upper Extremity Assessment Upper Extremity Assessment: Defer to OT evaluation    Lower Extremity Assessment Lower Extremity Assessment: RLE deficits/detail;LLE deficits/detail RLE Deficits / Details: Grossly 4/5 except hip flexion 3/5 LLE Deficits / Details: Grossly 4/5 except hip flexion 3/5       Communication   Communication: No difficulties  Cognition Arousal/Alertness: Awake/alert Behavior During Therapy: WFL for tasks assessed/performed Overall Cognitive Status: Within Functional Limits for tasks assessed                                        General Comments      Exercises General Exercises - Lower Extremity Ankle Circles/Pumps: 5 reps;Both;Supine Heel Slides: 5 reps;Both;Supine Straight Leg Raises: 5 reps;Both;Supine Other Exercises Other Exercises: Supine mini bridging x 5   Assessment/Plan    PT Assessment Patient needs continued PT services  PT Problem List Decreased strength;Decreased activity tolerance;Decreased balance;Decreased mobility       PT Treatment Interventions DME instruction;Gait training;Functional mobility training;Therapeutic activities;Therapeutic exercise;Balance training;Wheelchair mobility training;Patient/family education    PT Goals (Current goals can be found in the Care Plan section)  Acute Rehab PT Goals Patient Stated Goal: "get stronger." PT Goal Formulation: With patient Time For Goal Achievement: 12/05/18 Potential to Achieve Goals: Fair    Frequency Min 3X/week   Barriers to discharge        Co-evaluation               AM-PAC PT "6 Clicks" Mobility  Outcome Measure Help needed turning from your back to your side while in a flat bed without using bedrails?: A Little Help needed moving from lying on your back to sitting on the side of a flat bed without using bedrails?: A Lot Help needed moving to and from a bed to a chair (including a wheelchair)?: A Little Help needed standing up from a  chair using your arms (e.g., wheelchair or bedside chair)?: A Lot Help needed to walk in hospital room?: A Lot Help needed climbing 3-5 steps with a railing? : Total 6 Click Score: 13    End of Session Equipment Utilized During Treatment: Gait belt Activity Tolerance: Patient tolerated treatment well Patient left: in bed;with call bell/phone within reach;with family/visitor present Nurse Communication: Mobility status PT Visit Diagnosis: Muscle weakness (generalized) (M62.81);Other abnormalities of gait and mobility (R26.89);Unsteadiness on feet (R26.81)    Time: 8850-2774 PT Time Calculation (min) (ACUTE ONLY): 24 min   Charges:   PT Evaluation $PT Eval Moderate Complexity: 1 Mod PT Treatments $Therapeutic Exercise: 8-22 mins       Ellamae Sia, PT, DPT Acute Rehabilitation Services Pager [phone] Office [phone]  Willy Eddy 11/21/2018, 1:14 PM

## 2018-11-22 ENCOUNTER — Inpatient Hospital Stay (HOSPITAL_COMMUNITY): Payer: Medicare Other

## 2018-11-22 ENCOUNTER — Other Ambulatory Visit: Payer: Self-pay

## 2018-11-22 LAB — CBC
HCT: 42.9 % (ref 39.0–52.0)
HEMOGLOBIN: 13.5 g/dL (ref 13.0–17.0)
MCH: 28.1 pg (ref 26.0–34.0)
MCHC: 31.5 g/dL (ref 30.0–36.0)
MCV: 89.2 fL (ref 80.0–100.0)
NRBC: 0 % (ref 0.0–0.2)
PLATELETS: 191 10*3/uL (ref 150–400)
RBC: 4.81 MIL/uL (ref 4.22–5.81)
RDW: 15.9 % — ABNORMAL HIGH (ref 11.5–15.5)
WBC: 6.4 10*3/uL (ref 4.0–10.5)

## 2018-11-22 LAB — BASIC METABOLIC PANEL
ANION GAP: 10 (ref 5–15)
BUN: 55 mg/dL — ABNORMAL HIGH (ref 8–23)
CO2: 28 mmol/L (ref 22–32)
Calcium: 9.2 mg/dL (ref 8.9–10.3)
Chloride: 97 mmol/L — ABNORMAL LOW (ref 98–111)
Creatinine, Ser: 1.88 mg/dL — ABNORMAL HIGH (ref 0.61–1.24)
GFR calc Af Amer: 39 mL/min — ABNORMAL LOW (ref >60)
GFR calc non Af Amer: 34 mL/min — ABNORMAL LOW (ref >60)
Glucose, Bld: 269 mg/dL — ABNORMAL HIGH (ref 70–99)
POTASSIUM: 3.9 mmol/L (ref 3.5–5.1)
SODIUM: 135 mmol/L (ref 135–145)

## 2018-11-22 LAB — URINE CULTURE

## 2018-11-22 LAB — HEMOGLOBIN A1C
Hgb A1c MFr Bld: 11.2 % — ABNORMAL HIGH (ref 4.8–5.6)
MEAN PLASMA GLUCOSE: 274.74 mg/dL

## 2018-11-22 LAB — GLUCOSE, CAPILLARY: GLUCOSE-CAPILLARY: 325 mg/dL — AB (ref 70–99)

## 2018-11-22 MED ORDER — LIDOCAINE HCL 1 % IJ SOLN
INTRAMUSCULAR | Status: AC
  Filled 2018-11-22: qty 20

## 2018-11-22 MED ORDER — CEFEPIME IV (FOR PTA / DISCHARGE USE ONLY): 2.0000 g | INTRAVENOUS | 0 refills | Status: AC

## 2018-11-22 MED ORDER — DIPHENHYDRAMINE-ZINC ACETATE 2-0.1 % EX CREA
TOPICAL_CREAM | Freq: Three times a day (TID) | CUTANEOUS | Status: DC | PRN
  Administered 2018-11-22: 10:00:00 via TOPICAL
  Filled 2018-11-22: qty 28

## 2018-11-22 MED ORDER — DIPHENHYDRAMINE-ZINC ACETATE 2-0.1 % EX CREA: TOPICAL_CREAM | Freq: Three times a day (TID) | CUTANEOUS | 0 refills | Status: DC | PRN

## 2018-11-22 MED ORDER — DIPHENHYDRAMINE HCL 25 MG PO CAPS
25.0000 mg | ORAL_CAPSULE | Freq: Once | ORAL | Status: AC
  Administered 2018-11-22: 25 mg via ORAL
  Filled 2018-11-22: qty 1

## 2018-11-22 NOTE — Progress Notes (Signed)
Travis [name] to be D/C'd Home per MD order.  Discussed prescriptions and follow up appointments with the patient. Prescriptions given to patient, medication list explained in detail. Pt verbalized understanding.  Allergies as of 11/22/2018      Reactions   Other Nausea Only, Other (See Comments)   ANESTHESIA UNSPECIFIED- makes sick, must have anti-nausea medication in advance   Amlodipine Swelling, Other (See Comments)   Causes edema (per [facility] paperwork)   Compazine [prochlorperazine] Other (See Comments)   Caused agitation (per [facility] paperwork)   Cymbalta [duloxetine Hcl] Other (See Comments)   Made the patient "feel weird," per [facility] paperwork   Jardiance [empagliflozin] Other (See Comments)   Stopped by MD because of unwanted side effects   Plendil [felodipine] Swelling, Other (See Comments)   Causes edema (per [facility] paperwork)   Septra [sulfamethoxazole-trimethoprim] Nausea Only, Other (See Comments)   Per [facility] paperwork   Levofloxacin In D5w Itching   Possible infusion reaction 03/29/17. Tolerating oral Levaquin 03/30/17      Medication List    TAKE these medications   aspirin 81 MG EC tablet Take 1 tablet (81 mg total) by mouth daily.   ceFEPime  IVPB Commonly known as:  MAXIPIME Inject 2 g into the vein daily for 4 days. Indication:  UTI Last Day of Therapy:  11/26/18 Labs - Once weekly:  CBC/D and BMP, Labs - Every other week:  ESR and CRP   diphenhydrAMINE-zinc acetate cream Commonly known as:  BENADRYL Apply topically 3 (three) times daily as needed for itching. Apply to back   docusate sodium 100 MG capsule Commonly known as:  COLACE Take 200 mg by mouth at bedtime.   escitalopram 10 MG tablet Commonly known as:  LEXAPRO Take 1 tablet (10 mg total) by mouth daily.   gabapentin 400 MG capsule Commonly known as:  NEURONTIN TAKE 1 CAPSULE (400 MG TOTAL) BY MOUTH EVERY 8 (EIGHT) HOURS. What changed:     how much to take  how to take this  when to take this  additional instructions   glimepiride 2 MG tablet Commonly known as:  AMARYL Take 1 tablet (2 mg total) by mouth daily with breakfast.   hydrALAZINE 50 MG tablet Commonly known as:  APRESOLINE Take 1.5 tablets (75 mg total) by mouth 3 (three) times daily.   levothyroxine 175 MCG tablet Commonly known as:  SYNTHROID, LEVOTHROID Take 1 tablet (175 mcg total) by mouth daily.   metoprolol tartrate 25 MG tablet Commonly known as:  LOPRESSOR Take 0.5 tablets (12.5 mg total) by mouth daily.   multivitamin Tabs tablet Take 1 tablet by mouth at bedtime.   OZEMPIC (0.25 OR 0.5 MG/DOSE) 2 MG/1.5ML Sopn Generic drug:  Semaglutide(0.25 or 0.5MG/DOS) Inject 0.25 mg into the skin every Wednesday.   polyethylene glycol packet Commonly known as:  MIRALAX / GLYCOLAX Take 17 g by mouth daily as needed. For constipation What changed:    when to take this  additional instructions   potassium chloride SA 20 MEQ tablet Commonly known as:  K-DUR,KLOR-CON Take 2 tablets (40 mEq total) by mouth daily.   pramipexole 0.5 MG tablet Commonly known as:  MIRAPEX Take 1 tablet (0.5 mg total) by mouth 2 (two) times daily. What changed:    when to take this  additional instructions   spironolactone 25 MG tablet Commonly known as:  ALDACTONE Take 1 tablet (25 mg total) by mouth daily.   tamsulosin 0.4 MG Caps capsule  Commonly known as:  FLOMAX Take 1 capsule (0.4 mg total) by mouth daily. What changed:  when to take this   torsemide 20 MG tablet Commonly known as:  DEMADEX Take 3 tablets (60 mg total) by mouth 2 (two) times daily.   TOVIAZ 8 MG Tb24 tablet Generic drug:  fesoterodine Take 1 tablet (8 mg total) by mouth daily.            Home Infusion Instuctions  (From admission, onward)         Start     Ordered   11/22/18 0000  Home infusion instructions [facility] May follow [place] Dosing  Protocol; May administer Cathflo as needed to maintain patency of vascular access device.; Flushing of vascular access device: per [facility] Protocol: 0.9% NaCl pre/post medica...    Question Answer Comment  Instructions May follow [place] Dosing Protocol   Instructions May administer Cathflo as needed to maintain patency of vascular access device.   Instructions Flushing of vascular access device: per [place] Protocol: 0.9% NaCl pre/post medication administration and prn patency; Heparin 100 u/ml, 52m for implanted ports and Heparin 10u/ml, 568mfor all other central venous catheters.   Instructions May follow [facility] Anaphylaxis Protocol for First Dose Administration in the home: 0.9% NaCl at 25-50 ml/hr to maintain IV access for protocol meds. Epinephrine 0.3 ml IV/IM PRN and Benadryl 25-50 IV/IM PRN s/s of anaphylaxis.   Instructions [facility] Infusion Coordinator (RN) to assist per patient IV care needs in the home PRN.      11/22/18 1016          Vitals:   11/22/18 0951 11/22/18 1520  BP: 115/70 111/61  Pulse: 86   Resp: 19   Temp: (!) 97.5 F (36.4 C)   SpO2: 94%     IV catheter discontinued intact. Site without signs and symptoms of complications. Dressing and pressure applied. Pt denies pain at this time. No complaints noted. PICC WDL. Education provided on care and maintenance.   An After Visit Summary was printed and given to the patient. [place].  AnAneta MinsSN, RN

## 2018-11-22 NOTE — Procedures (Signed)
PROCEDURE SUMMARY:  Successful placement of single lumen PICC line to right basilic vein. Length 47 cm Tip at lower SVC/RA No complications Ready for use. EBL < 5 mL  Please see full dictation in Imaging section for details.   Kahlen Morais S Rafferty Postlewait [name] 11/22/2018 2:48 PM

## 2018-11-22 NOTE — Discharge Summary (Signed)
Physician Discharge Summary   Patient ID: Travis [name] MRN: [id number] DOB/AGE: 05-30-1946 72 y.o.  Admit date: 11/20/2018 Discharge date: 11/22/2018  Primary Care Physician:  [name], MD   Recommendations for Outpatient Follow-up:  1. Follow up with PCP in 1-2 weeks 2. Continue IV cefepime every 24 hours, for 4 more days, last dose 11/26/2018  Home Health: Home health PT OT, RN Equipment/Devices:   Discharge Condition: stable  CODE STATUS: FULL Diet recommendation: Carb modified diet   Discharge Diagnoses:   . Complicated urinary tract infection, Pseudomonas . Hypothyroid . Chronic diastolic CHF (congestive heart failure) ([place]) . Stage 3 chronic kidney disease ([place]) . Benign prostatic hyperplasia with urinary retention . Poorly controlled type 2 diabetes mellitus with peripheral neuropathy ([place]) . Morbid obesity ([place]) . Benign essential HTN . Pressure injury of the skin   Consults: Infectious disease    Allergies:   Allergies  Allergen Reactions  . Other Nausea Only and Other (See Comments)    ANESTHESIA UNSPECIFIED- makes sick, must have anti-nausea medication in advance  . Amlodipine Swelling and Other (See Comments)    Causes edema (per [facility] paperwork)  . Compazine [Prochlorperazine] Other (See Comments)    Caused agitation (per [facility] paperwork)  . Cymbalta [Duloxetine Hcl] Other (See Comments)    Made the patient "feel weird," per [facility] paperwork  . Jardiance [Empagliflozin] Other (See Comments)    Stopped by MD because of unwanted side effects  . Plendil [Felodipine] Swelling and Other (See Comments)    Causes edema (per [facility] paperwork)  . Septra [Sulfamethoxazole-Trimethoprim] Nausea Only and Other (See Comments)    Per [facility] paperwork  . Levofloxacin In D5w Itching    Possible infusion reaction 03/29/17. Tolerating oral Levaquin 03/30/17     DISCHARGE MEDICATIONS: Allergies as of  11/22/2018      Reactions   Other Nausea Only, Other (See Comments)   ANESTHESIA UNSPECIFIED- makes sick, must have anti-nausea medication in advance   Amlodipine Swelling, Other (See Comments)   Causes edema (per [facility] paperwork)   Compazine [prochlorperazine] Other (See Comments)   Caused agitation (per [facility] paperwork)   Cymbalta [duloxetine Hcl] Other (See Comments)   Made the patient "feel weird," per [facility] paperwork   Jardiance [empagliflozin] Other (See Comments)   Stopped by MD because of unwanted side effects   Plendil [felodipine] Swelling, Other (See Comments)   Causes edema (per [facility] paperwork)   Septra [sulfamethoxazole-trimethoprim] Nausea Only, Other (See Comments)   Per [facility] paperwork   Levofloxacin In D5w Itching   Possible infusion reaction 03/29/17. Tolerating oral Levaquin 03/30/17      Medication List    TAKE these medications   aspirin 81 MG EC tablet Take 1 tablet (81 mg total) by mouth daily.   ceFEPime  IVPB Commonly known as:  MAXIPIME Inject 2 g into the vein daily for 4 days. Indication:  UTI Last Day of Therapy:  11/26/18 Labs - Once weekly:  CBC/D and BMP, Labs - Every other week:  ESR and CRP   diphenhydrAMINE-zinc acetate cream Commonly known as:  BENADRYL Apply topically 3 (three) times daily as needed for itching. Apply to back   docusate sodium 100 MG capsule Commonly known as:  COLACE Take 200 mg by mouth at bedtime.   escitalopram 10 MG tablet Commonly known as:  LEXAPRO Take 1 tablet (10 mg total) by mouth daily.   gabapentin 400 MG capsule Commonly known as:  NEURONTIN TAKE 1 CAPSULE (400 MG TOTAL) BY MOUTH EVERY 8 (EIGHT) HOURS. What changed:    how much to take  how to take this  when to take this  additional instructions   glimepiride 2 MG tablet Commonly known as:  AMARYL Take 1 tablet (2 mg total) by mouth daily with breakfast.   hydrALAZINE 50 MG  tablet Commonly known as:  APRESOLINE Take 1.5 tablets (75 mg total) by mouth 3 (three) times daily.   levothyroxine 175 MCG tablet Commonly known as:  SYNTHROID, LEVOTHROID Take 1 tablet (175 mcg total) by mouth daily.   metoprolol tartrate 25 MG tablet Commonly known as:  LOPRESSOR Take 0.5 tablets (12.5 mg total) by mouth daily.   multivitamin Tabs tablet Take 1 tablet by mouth at bedtime.   OZEMPIC (0.25 OR 0.5 MG/DOSE) 2 MG/1.5ML Sopn Generic drug:  Semaglutide(0.25 or 0.5MG/DOS) Inject 0.25 mg into the skin every Wednesday.   polyethylene glycol packet Commonly known as:  MIRALAX / GLYCOLAX Take 17 g by mouth daily as needed. For constipation What changed:    when to take this  additional instructions   potassium chloride SA 20 MEQ tablet Commonly known as:  K-DUR,KLOR-CON Take 2 tablets (40 mEq total) by mouth daily.   pramipexole 0.5 MG tablet Commonly known as:  MIRAPEX Take 1 tablet (0.5 mg total) by mouth 2 (two) times daily. What changed:    when to take this  additional instructions   spironolactone 25 MG tablet Commonly known as:  ALDACTONE Take 1 tablet (25 mg total) by mouth daily.   tamsulosin 0.4 MG Caps capsule Commonly known as:  FLOMAX Take 1 capsule (0.4 mg total) by mouth daily. What changed:  when to take this   torsemide 20 MG tablet Commonly known as:  DEMADEX Take 3 tablets (60 mg total) by mouth 2 (two) times daily.   TOVIAZ 8 MG Tb24 tablet Generic drug:  fesoterodine Take 1 tablet (8 mg total) by mouth daily.            Home Infusion Instuctions  (From admission, onward)         Start     Ordered   11/22/18 0000  Home infusion instructions [facility] May follow [place] Dosing Protocol; May administer Cathflo as needed to maintain patency of vascular access device.; Flushing of vascular access device: per [facility] Protocol: 0.9% NaCl pre/post medica...    Question Answer Comment  Instructions May follow [place] Dosing Protocol   Instructions May administer Cathflo as needed to maintain patency of vascular access device.   Instructions Flushing of vascular access device: per [facility] Protocol: 0.9% NaCl pre/post medication administration and prn patency; Heparin 100 u/ml, 71m for implanted ports and Heparin 10u/ml, 526mfor all other central venous catheters.   Instructions May follow [facility] Anaphylaxis Protocol for First Dose Administration in the home: 0.9% NaCl at 25-50 ml/hr to maintain IV access for protocol meds. Epinephrine 0.3 ml IV/IM PRN and Benadryl 25-50 IV/IM PRN s/s of anaphylaxis.   Instructions [facility] Infusion Coordinator (RN) to assist per patient IV care needs in the home PRN.      11/22/18 1016           Brief H and P: For complete details please refer to admission H and P, but in brief * Patient is a [age]ear old male with diastolic CHF, COPD, OSA on home BiPAP, hypertension, hypothyroidism presented from PCPs office, Dr. EhMarisue Humble Patient was called  with positive urine culture growing Pseudomonas and Enterobacter, only sensitive for cefepime, gentamicin and tobramycin.  Patient had complained of dysuria, lower abdominal pain and off-and-on fever.  Patient has recurrent episodes of UTI and has been seen recently by urology this week, found to have incontinence.  Per patient's family, he gets easily fluid overloaded due to his CHF and has a history of intubation 3 years ago.   Hospital Course:  Complicated urinary tract infection with urine culture positive for Enterobacter, Pseudomonas -He has a history of recurrent UTIs and has been on antibiotics off and on -CT abdomen showed enlarged prostate otherwise was negative for pyelonephritis or renal abscess -Patient was placed on IV for cefepime, infectious disease consult was obtained.  Dr. Johnnye Sima recommended continue antibiotics for 7 days.   -Tunneled central line was ordered however patient requested PICC line     OSA on CPAP -Continue CPAP at night  Poorly controlled type 2 diabetes mellitus with hypoglycemia, NIDDM -Old, CBGs in 200s.  Hemoglobin A1c 11.2. -Patient was placed on Lantus, sliding scale insulin, NovoLog meal coverage while inpatient    Hypothyroid -Continue Synthroid    Chronic diastolic CHF (congestive heart failure) (HCC) -Currently euvolemic, continue torsemide, spironolactone    Stage 3 chronic kidney disease (HCC) -Baseline creatinine 1.6-2.0 -Creatinine 1.8  currently baseline   obesity (HCC) -BMI 34, diet and weight control recommended    Benign essential HTN -BP currently stable    Benign prostatic hyperplasia with urinary retention -Continue Flomax, Toviaz    Pressure injury of skin Small full-thickness wound to sacrum 1x 1x 0.5 cm Wound care per nursing  Day of Discharge S: Patient hoping for discharge home, stable overnight, no acute issues  BP 115/70 (BP Location: Left Arm)   Pulse 86   Temp (!) 97.5 F (36.4 C) (Oral)   Resp 19   Ht 6' 1"  (1.854 m)   Wt 114.2 kg   SpO2 94%   BMI 33.22 kg/m   Physical Exam: General: Alert and awake oriented x3 not in any acute distress. HEENT: anicteric sclera, pupils reactive to light and accommodation CVS: S1-S2 clear no murmur rubs or gallops Chest: clear to auscultation bilaterally, no wheezing rales or rhonchi Abdomen: soft nontender, nondistended, normal bowel sounds Extremities: no cyanosis, clubbing or edema noted bilaterally Neuro: Cranial nerves II-XII intact, no focal neurological deficits   The results of significant diagnostics from this hospitalization (including imaging, microbiology, ancillary and laboratory) are listed below for reference.      Procedures/Studies:  Ct Abdomen Pelvis Wo Contrast  Result Date: 11/21/2018 CLINICAL DATA:  Recurrent urinary tract infections. Nausea. Constipation. Chronic kidney disease stage 3. EXAM: CT ABDOMEN AND PELVIS WITHOUT CONTRAST TECHNIQUE:  Multidetector CT imaging of the abdomen and pelvis was performed following the standard protocol without IV contrast. COMPARISON:  06/28/2018 FINDINGS: Lower chest: No acute findings. Hepatobiliary: No mass visualized on this unenhanced exam. Prior cholecystectomy. No evidence of biliary obstruction. Pancreas: No mass or inflammatory process visualized on this unenhanced exam. Spleen:  Within normal limits in size. Adrenals/Urinary tract: No evidence of urolithiasis or hydronephrosis. Probable small right renal cyst again noted. No evidence of perinephric inflammatory changes or fluid collections. Unremarkable unopacified urinary bladder. Stomach/Bowel: No evidence of obstruction, inflammatory process, or abnormal fluid collections. Vascular/Lymphatic: No pathologically enlarged lymph nodes identified. No evidence of abdominal aortic aneurysm. Aortic atherosclerosis. Reproductive:  Stable mildly enlarged prostate. Other:  None. Musculoskeletal:  No suspicious bone lesions identified. IMPRESSION: No acute findings. Stable mildly enlarged  prostate. Electronically Signed   By: Earle Gell M.D.   On: 11/21/2018 19:54      LAB RESULTS: Basic Metabolic Panel: Recent Labs  Lab 11/20/18 1925 11/22/18 0506  NA 132* 135  K 4.8 3.9  CL 93* 97*  CO2 31 28  GLUCOSE 386* 269*  BUN 56* 55*  CREATININE 2.03* 1.88*  CALCIUM 9.3 9.2   Liver Function Tests: Recent Labs  Lab 11/20/18 1925  AST 17  ALT 19  ALKPHOS 89  BILITOT 0.6  PROT 8.0  ALBUMIN 3.7   No results for input(s): LIPASE, AMYLASE in the last 168 hours. No results for input(s): AMMONIA in the last 168 hours. CBC: Recent Labs  Lab 11/20/18 1925 11/22/18 0506  WBC 6.8 6.4  HGB 13.2 13.5  HCT 42.6 42.9  MCV 89.1 89.2  PLT 207 191   Cardiac Enzymes: No results for input(s): CKTOTAL, CKMB, CKMBINDEX, TROPONINI in the last 168 hours. BNP: Invalid input(s): POCBNP CBG: Recent Labs  Lab 11/21/18 2038 11/22/18 0734  GLUCAP 239*  325*      Disposition and Follow-up: Discharge Instructions    Diet Carb Modified   Complete by:  As directed    Discharge instructions   Complete by:  As directed    It is VERY IMPORTANT that you follow up with a PCP on a regular basis.  Check your blood glucoses before each meal and at bedtime and maintain a log of your readings.  Bring this log with you when you follow up with your PCP so that he or she can adjust your insulin at your follow up visit.   Home infusion instructions [facility] May follow [place] Dosing Protocol; May administer Cathflo as needed to maintain patency of vascular access device.; Flushing of vascular access device: per [facility] Protocol: 0.9% NaCl pre/post medica...   Complete by:  As directed    Instructions:  May follow [place] Dosing Protocol   Instructions:  May administer Cathflo as needed to maintain patency of vascular access device.   Instructions:  Flushing of vascular access device: per [facility] Protocol: 0.9% NaCl pre/post medication administration and prn patency; Heparin 100 u/ml, 54m for implanted ports and Heparin 10u/ml, 520mfor all other central venous catheters.   Instructions:  May follow [facility] Anaphylaxis Protocol for First Dose Administration in the home: 0.9% NaCl at 25-50 ml/hr to maintain IV access for protocol meds. Epinephrine 0.3 ml IV/IM PRN and Benadryl 25-50 IV/IM PRN s/s of anaphylaxis.   Instructions:  [facility] Coord[facility] (RN) to assist per patient IV care needs in the home PRN.   Increase activity slowly   Complete by:  As directed        DISPOSITION: *Home with home health   [place].   Specialty:  Home Health Services Why:  Home Health RN, Physical Therapy, Occupational Therapy-agency will call to arrange initial visit Contact information: [phone]      EhGaynelle ArabianMD. Schedule an appointment as soon as possible for a visit in 10 days.   Specialty:  Family Medicine Why:  Clinical Director will contact you about a follow up appointment. Thank you. Contact information: [phone]        Nahser, PhWonda ChengMD.   Specialty:  Cardiology Why:  Office will call you in  reference to a hospital follow up appointment. Contact information: [place] [address] [place] [address] [phone]            Time coordinating discharge:  35 minutes  Signed:   Estill Cotta M.D. [facility] 11/22/2018, 12:57 PM Pager: [phone]

## 2018-11-22 NOTE — Care Management Note (Addendum)
Case Management Note  Patient Details  Name: REEDY BIERNAT MRN: [id number] Date of Birth: 1946-10-10  Subjective/Objective:    Complicated UTI with Enterobacter, Pseudomonas                Action/Plan: NCM spoke to pt and offered choice for HH/list provided. Pt requested [facility] for HH. Contacted [facility] Infusion Coordinator with new referral for IV abx at home. Will need HHRN, PT and OT orders with F2F. Pt reports having RW, wheelchair, bedside commode, oxygen ([facility]), CPAP and shower chair at home. Wife and Dtr Investment banker, corporate) will be at home to assist.   Expected Discharge Date:                Expected Discharge Plan:  [place]  In-House Referral:  NA  Discharge planning Services  CM Consult  Post Acute Care Choice:  Home Health Choice offered to:  Patient  DME Arranged:  [name] DME Agency:  NA  HH Arranged:  RN, PT, OT HH Agency:  [place]  Status of Service:  Completed, signed off  If discussed at [place] of Stay Meetings, dates discussed:    Additional Comments:  Erenest Rasher, RN 11/22/2018, 10:04 AM

## 2018-11-22 NOTE — Progress Notes (Signed)
PHARMACY CONSULT NOTE FOR:  OUTPATIENT  PARENTERAL ANTIBIOTIC THERAPY (OPAT)  Indication: UTI Regimen: Cefepime 2gm IV q24h End date: 11/26/18  IV antibiotic discharge orders are pended. To discharging provider:  please sign these orders via discharge navigator,  Select New Orders & click on the button choice - Manage This Unsigned Work.    Theresa Wedel A. Levada Dy, PharmD, [place] Pager: [phone] Please utilize Amion for appropriate phone number to reach the unit pharmacist ([place])   11/22/2018, 9:51 AM

## 2018-11-22 NOTE — H&P (Signed)
   Patient Status: [facility] - In-pt  Assessment and Plan:  Patient in need of venous access for long term antibiotics for UTI.  Risks and benefits of PICC versus Tunneled central catheter placement were discussed with the patient including, but not limited to bleeding, infection, vascular injury which would impede the use of the vein for fistula/graft placement.  He assures me he will "never go on dialysis". He says he is too old and has too may health problems and dialysis is something he would never consider.  He is requesting the PICC be placed in his arm instead of a tunneled catheter.  All of the patient's questions were answered, patient is agreeable to proceed. Consent signed and in chart.   ______________________________________________________________________   History of Present Illness: Travis [name] is a 72 y.o. male with positive urine culture that is growing Pseudomonas and Enterobacter.  The sensitivity did not show any possible outpatient medication.  All of the sensitivity was for IV antibiotics which includes cefepime and gentamicin and tobramycin.  We are asked to place a central venous catheter.  His baseline creatinine is 1.6 - 1.8.  Risks and benefits of PICC versus Tunneled central catheter placement were discussed with the patient including, but not limited to bleeding, infection, vascular injury which would impede the use of the vein for fistula/graft placement.  He assures me he will "never go on dialysis". He says he is too old and has too may health problems and dialysis is something he would never consider.  He is requesting the PICC be placed in his arm instead of a tunneled catheter.  All of the patient's questions were answered, patient is agreeable to proceed. Consent signed and in chart.  Allergies and medications reviewed.   Review of Systems  Vital Signs: BP 134/74 (BP Location: Right Arm)   Pulse 68   Temp 98.3 F (36.8 C) (Oral)    Resp 20   Ht 6\' 1"  (1.854 m)   Wt 114.2 kg   SpO2 97%   BMI 33.22 kg/m   Physical Exam  Constitutional: He is oriented to person, place, and time. He appears well-developed.  HENT:  Head: Normocephalic and atraumatic.  Pulmonary/Chest: He is in respiratory distress.  Musculoskeletal: Normal range of motion.  Neurological: He is alert and oriented to person, place, and time.  Psychiatric: He has a normal mood and affect. His behavior is normal. Judgment and thought content normal.  Vitals reviewed.    Imaging reviewed.   Labs:  COAGS: Recent Labs    03/02/18 0941  INR 1.07    BMP: Recent Labs    10/27/18 1013 11/03/18 1017 11/20/18 1925 11/22/18 0506  NA 135 135 132* 135  K 5.0 4.3 4.8 3.9  CL 98 94* 93* 97*  CO2 27 29 31 28   GLUCOSE 342* 455* 386* 269*  BUN 55* 66* 56* 55*  CALCIUM 9.2 9.2 9.3 9.2  CREATININE 1.67* 2.08* 2.03* 1.88*  GFRNONAA 39* 30* 31* 34*  GFRAA 45* 35* 36* 39*       Electronically Signed: Murrell Redden, [name] 11/22/2018, 9:39 AM   I spent a total of 15 minutes in face to face in clinical consultation, greater than 50% of which was counseling/coordinating care for venous access.

## 2018-11-22 NOTE — Progress Notes (Signed)
Inpatient Diabetes Program Recommendations  AACE/ADA: New Consensus Statement on Inpatient Glycemic Control (2015)  Target Ranges:  Prepandial:   less than 140 mg/dL      Peak postprandial:   less than 180 mg/dL (1-2 hours)      Critically ill patients:  140 - 180 mg/dL   Lab Results  Component Value Date   GLUCAP 325 (H) 11/22/2018   HGBA1C 11.2 (H) 11/22/2018  Results for Travis [name], Travis [name] (MRN [id number]) as of 11/22/2018 11:55  Ref. Range 11/21/2018 11:26 11/21/2018 13:13 11/21/2018 16:47 11/21/2018 20:38 11/22/2018 07:34  Glucose-Capillary Latest Ref Range: 70 - 99 mg/dL 434 (H) 398 (H) 197 (H) 239 (H) 325 (H)    Review of Glycemic Control  Diabetes history: Type 2 Outpatient Diabetes medications: Ozempic 0.25 mg weekly, Amaryl Current orders for Inpatient glycemic control: Lantus 7 units every HS, Novolog 0-9 units TID & HS, Novolog 3 units TID  Inpatient Diabetes Program Recommendations:    Noted that blood sugars continue to be greater than 180 mg/dl. Recommend increasing Lantus to 15 units every HS, increasing Novolog 0-15 units correction scale TID & HS, and continuing Novolog 3 units TID if patient eats at least 50% of meal.  Harvel Ricks RN BSN CDE Diabetes Coordinator Pager: [phone]  8am-5pm

## 2018-11-22 NOTE — Progress Notes (Signed)
Spoke with patient about his diabetes. Was diagnosed about 3 years ago. Sees Dr. Marisue Humble as PCP. States that he only takes Amaryl for his diabetes at home. No injections of any medication at home. States that he checks blood sugars about 3 times per week and the results are usually 120-150 mg/dl. States that his HgbA1C is higher than it has been of 11.2% which is equal to about 270 mg/dl on an average of 2-3 months. Last saw his PCP last week. Will continue to follow up.   Will continue to monitor blood sugars while in the hospital.  Harvel Ricks RN BSN CDE Diabetes Coordinator Pager: [phone]  8am-5pm

## 2018-11-23 ENCOUNTER — Inpatient Hospital Stay (HOSPITAL_COMMUNITY)
Admission: RE | Admit: 2018-11-23 | Discharge: 2018-11-23 | Disposition: A | Discharge status: Home / Self Care | Payer: Medicare Other | Source: Ambulatory Visit

## 2018-11-24 ENCOUNTER — Encounter

## 2018-11-24 ENCOUNTER — Ambulatory Visit: Payer: Medicare Other | Admitting: Neurology

## 2018-11-24 ENCOUNTER — Encounter: Payer: Self-pay | Admitting: Neurology

## 2018-11-24 VITALS — BP 142/73 | HR 78 | Ht 73.0 in

## 2018-11-24 DIAGNOSIS — M4712 Other spondylosis with myelopathy, cervical region: Secondary | ICD-10-CM | POA: Diagnosis not present

## 2018-11-24 DIAGNOSIS — E1142 Type 2 diabetes mellitus with diabetic polyneuropathy: Secondary | ICD-10-CM | POA: Diagnosis not present

## 2018-11-24 DIAGNOSIS — E1165 Type 2 diabetes mellitus with hyperglycemia: Secondary | ICD-10-CM | POA: Diagnosis not present

## 2018-11-24 DIAGNOSIS — M4714 Other spondylosis with myelopathy, thoracic region: Secondary | ICD-10-CM | POA: Diagnosis not present

## 2018-11-24 MED ORDER — PRAMIPEXOLE DIHYDROCHLORIDE 0.5 MG PO TABS: ORAL_TABLET | ORAL | 1 refills | Status: AC

## 2018-11-24 NOTE — Progress Notes (Signed)
Reason for visit: Cervical and thoracic myelopathy  Travis [name] is an 72 y.o. male  History of present illness:  Travis [name] is a 71 year old right-handed white male with a history of a cervical myelopathy, he recent underwent surgery for a T1 level myelopathy as well and for lumbosacral spinal stenosis.  The patient underwent decompression of the thoracic level and of the low back, he has required ongoing physical therapy, he has developed a pressure sore on the buttocks.  The patient has a nurse coming out to the house to help with this.  He continues to have restless leg type symptoms, the Mirapex has been helpful for this.  His leg particularly on the right will jerk at night, he takes Benadryl to help him sleep.  He takes Mirapex 0.5 mg twice daily.  He is able to walk about 100 feet with a walker once he gets up on his feet, but he has difficulty arising to a standing position.  He has lost some weight.  He recently was in the hospital on 20 November 2018 for a Pseudomonas urinary tract infection.  He is continuing to get IV antibiotics at home.  Past Medical History:  Diagnosis Date  . Cervical myelopathy (HCC)    with myelomalacia at C5-6 and C3-4  . CHF (congestive heart failure) ([place])   . Constipation - functional    controlled with miralax  . COPD (chronic obstructive pulmonary disease) (HCC)    mild  . DJD (degenerative joint disease)    WHOLE SPINE  . Dyspnea   . Gait disorder   . Heart murmur    years ago   . Hypertension   . Hypothyroidism    Low d/t treatment of hyperthyroidism  . Lumbosacral spinal stenosis 03/13/2014  . On home oxygen therapy    "2.5L all the time" (06/29/2018)  . Peripheral neuropathy   . Pneumonia   . PONV (postoperative nausea and vomiting)   . RBBB   . Renal disorder    acute kidney failure  . Restless legs syndrome 03/14/2015  . RLS (restless legs syndrome)   . Sleep apnea    DX  2009/2010 [facility] at [facility]  . 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 Type II diabetes mellitus ([place])    diet controlled    Past Surgical History:  Procedure Laterality Date  . BACK SURGERY     6 surgeries  . CARDIAC CATHETERIZATION [name] 09/07/2015   Procedure: Right/Left Heart Cath and Coronary Angiography;  Surgeon: Peter M Martinique, MD;  Location: [facility];  Service: Cardiovascular;  Laterality: [name];  . CHOLECYSTECTOMY    . EYE SURGERY     BIL CATARACTS  . LUMBAR LAMINECTOMY/DECOMPRESSION MICRODISCECTOMY  07/14/2012   Procedure: LUMBAR LAMINECTOMY/DECOMPRESSION MICRODISCECTOMY 2 LEVELS;  Surgeon: Eustace Moore, MD;  Location: [place] [facility];  Service: Neurosurgery;  Laterality: Left;  Lumbar two three laminectomy, left thoracic four five transpedicular diskectomy  . POSTERIOR CERVICAL FUSION/FORAMINOTOMY  04/08/2018   Procedure: Laminectomy and Foraminotomy - Lumbar Four-Lumbar Five, instrumented posterolateral fusion Lumbar Four- Five, Laminectomy and Foraminotomy - Thoracic One-Thoracic Two;  Surgeon: Eustace Moore, MD;  Location: [facility];  Service: Neurosurgery;;  posterior  . POSTERIOR LUMBAR FUSION  04/08/2018   Laminectomy and Foraminotomy - Lumbar Four-Lumbar Five, instrumented posterolateral fusion Lumbar Four- Five, Laminectomy and Foraminotomy - Thoracic One-Thoracic Two  . POSTERIOR LUMBAR FUSION  04/08/2018   Laminectomy and Foraminotomy - Lumbar Four-Lumbar Five,  instrumented posterolateral fusion Lumbar Four- Five, Laminectomy and Foraminotomy - Thoracic One-Thoracic Two    Family History  Problem Relation Age of Onset  . Polycythemia Mother   . Diabetes Father   . Hypertension Father   . Heart disease Father   . CAD Unknown        1 of his 4 brothers and father has CAD    Social history:  reports that he quit smoking about 33 years ago. His smoking use included cigarettes. He has a 40.00 pack-year smoking history. He quit smokeless tobacco use about 33 years ago. He reports that he does not drink alcohol or use  drugs.    Allergies  Allergen Reactions  . Other Nausea Only and Other (See Comments)    ANESTHESIA UNSPECIFIED- makes sick, must have anti-nausea medication in advance  . Amlodipine Swelling and Other (See Comments)    Causes edema (per [facility] paperwork)  . Compazine [Prochlorperazine] Other (See Comments)    Caused agitation (per [facility] paperwork)  . Cymbalta [Duloxetine Hcl] Other (See Comments)    Made the patient "feel weird," per [facility] paperwork  . Jardiance [Empagliflozin] Other (See Comments)    Stopped by MD because of unwanted side effects  . Plendil [Felodipine] Swelling and Other (See Comments)    Causes edema (per [facility] paperwork)  . Septra [Sulfamethoxazole-Trimethoprim] Nausea Only and Other (See Comments)    Per [facility] paperwork  . Levofloxacin In D5w Itching    Possible infusion reaction 03/29/17. Tolerating oral Levaquin 03/30/17    Medications:  Prior to Admission medications   Medication Sig Start Date End Date Taking? Authorizing Provider  aspirin EC 81 MG EC tablet Take 1 tablet (81 mg total) by mouth daily. 08/23/15  Yes Thurnell Lose, MD  ceFEPime (MAXIPIME) IVPB Inject 2 g into the vein daily for 4 days. Indication:  UTI Last Day of Therapy:  11/26/18 Labs - Once weekly:  CBC/D and BMP, Labs - Every other week:  ESR and CRP 11/22/18 11/26/18 Yes Rai, Ripudeep K, MD  diphenhydrAMINE-zinc acetate (BENADRYL) cream Apply topically 3 (three) times daily as needed for itching. Apply to back 11/22/18  Yes Rai, Ripudeep K, MD  docusate sodium (COLACE) 100 MG capsule Take 200 mg by mouth at bedtime.   Yes [provider]  escitalopram (LEXAPRO) 10 MG tablet Take 1 tablet (10 mg total) by mouth daily. 05/12/18  Yes Angiulli, Lavon Paganini, [name]  gabapentin (NEURONTIN) 400 MG capsule TAKE 1 CAPSULE (400 MG TOTAL) BY MOUTH EVERY 8 (EIGHT) HOURS. Patient taking differently: Take 400 mg by mouth 3 (three) times daily.   05/12/18  Yes Angiulli, Lavon Paganini, [name]  glimepiride (AMARYL) 2 MG tablet Take 1 tablet (2 mg total) by mouth daily with breakfast. 05/13/18  Yes Angiulli, Lavon Paganini, [name]  levothyroxine (SYNTHROID, LEVOTHROID) 175 MCG tablet Take 1 tablet (175 mcg total) by mouth daily. 05/12/18  Yes Angiulli, Lavon Paganini, [name]  metoprolol tartrate (LOPRESSOR) 25 MG tablet Take 0.5 tablets (12.5 mg total) by mouth daily. 08/16/18  Yes Clegg, Amy D, NP  multivitamin (ONE-A-DAY MEN'S) TABS tablet Take 1 tablet by mouth at bedtime.   Yes [provider]  polyethylene glycol (MIRALAX / GLYCOLAX) packet Take 17 g by mouth daily as needed. For constipation Patient taking differently: Take 17 g by mouth at bedtime.  08/09/14  Yes Love, Ivan Anchors, [name]  potassium chloride SA (K-DUR,KLOR-CON) 20 MEQ tablet Take 2 tablets (40 mEq total) by  mouth daily. 09/21/18  Yes Georgiana Shore, NP  pramipexole (MIRAPEX) 0.5 MG tablet Take 1 tablet (0.5 mg total) by mouth 2 (two) times daily. Patient taking differently: Take 0.5 mg by mouth See admin instructions. Take 0.5 mg by mouth two times a day- morning and bedtime 05/12/18  Yes Angiulli, Lavon Paganini, [name]  Semaglutide,0.25 or 0.5MG/DOS, (OZEMPIC, 0.25 OR 0.5 MG/DOSE,) 2 MG/1.5ML SOPN Inject 0.25 mg into the skin every Wednesday.   Yes [provider]  spironolactone (ALDACTONE) 25 MG tablet Take 1 tablet (25 mg total) by mouth daily. 10/29/18  Yes Nahser, Wonda Cheng, MD  tamsulosin (FLOMAX) 0.4 MG CAPS capsule Take 1 capsule (0.4 mg total) by mouth daily. Patient taking differently: Take 0.4 mg by mouth at bedtime.  05/12/18  Yes Angiulli, Lavon Paganini, [name]  torsemide (DEMADEX) 20 MG tablet Take 3 tablets (60 mg total) by mouth 2 (two) times daily. 09/03/18  Yes Larey Dresser, MD  TOVIAZ 8 MG TB24 tablet Take 1 tablet (8 mg total) by mouth daily. 05/12/18  Yes Angiulli, Lavon Paganini, [name]  hydrALAZINE (APRESOLINE) 50 MG tablet Take 1.5 tablets (75 mg total) by mouth 3 (three) times daily.  07/23/18 11/20/18  Richardson Dopp T, [name]    ROS:  Out of a complete 14 system review of symptoms, the patient complains only of the following symptoms, and all other reviewed systems are negative.  Cold intolerance, excessive thirst, excessive eating Leg swelling restless legs, insomnia, sleep apnea, snoring Frequent infections incontinence of bladder, frequency of urination Back pain, muscle cramps Bruising easily Depression    Blood pressure (!) 142/73, pulse 78, height 6' 1"  (1.854 m), SpO2 98 %.  Physical Exam  General: The patient is alert and cooperative at the time of the examination.  The patient is moderately obese.  Skin: 2+ edema below the knees is seen bilaterally.   Neurologic Exam  Mental status: The patient is alert and oriented x 3 at the time of the examination. The patient has apparent normal recent and remote memory, with an apparently normal attention span and concentration ability.   Cranial nerves: Facial symmetry is present. Speech is normal, no aphasia or dysarthria is noted. Extraocular movements are full. Visual fields are full.  Motor: The patient has good strength in all 4 extremities.  Sensory examination: Soft touch sensation is symmetric on the face, arms, and legs.  Coordination: The patient has good finger-nose-finger bilaterally.  The patient has difficulty performing heel shin on either side.  Gait and station: The patient is in a wheelchair, it is quite difficult to get him to stand.  Reflexes: Deep tendon reflexes are symmetric, but are depressed.   Assessment/Plan:  1.  Cervical and thoracic myelopathy  2.  Lumbosacral spinal stenosis, status post decompression  3.  Peripheral neuropathy  4.  Restless leg syndrome  5.  Gait disorder  6.  Sacral pressure sore  The patient is slowly recovering from his spinal surgery, his leg strength has gotten better since last seen.  The patient is able to walk a short distance, but he has  a lot of difficulty standing from a seated position.  He does have a hospital bed and a lift chair, but the lift chair does not work well for him.  The patient will be increased on the Mirapex taking 0.5 mg in the morning and 1 mg in the evening.  A prescription was sent in.  He will follow-up in 6 months.  When the patient  is sitting, he has very little pain at this point.  He continues to have urinary urgency and frequency.  He does have a urology doctor, he may initiate tibial nerve stimulation therapy in the near future.  Jill Alexanders MD 11/24/2018 7:58 AM  [facility] [address] [place], [place] [address]  Phone [phone] Fax [phone]

## 2018-11-26 ENCOUNTER — Encounter (HOSPITAL_COMMUNITY)
Admission: RE | Admit: 2018-11-26 | Discharge: 2018-11-26 | Disposition: A | Discharge status: Home / Self Care | Payer: Medicare Other | Source: Ambulatory Visit | Attending: Internal Medicine | Admitting: Internal Medicine

## 2018-11-26 ENCOUNTER — Ambulatory Visit (HOSPITAL_COMMUNITY)
Admission: RE | Admit: 2018-11-26 | Discharge: 2018-11-26 | Disposition: A | Discharge status: Home / Self Care | Payer: Medicare Other | Source: Ambulatory Visit | Attending: Internal Medicine | Admitting: Internal Medicine

## 2018-11-26 DIAGNOSIS — I5032 Chronic diastolic (congestive) heart failure: Secondary | ICD-10-CM | POA: Insufficient documentation

## 2018-11-26 LAB — CULTURE, BLOOD (ROUTINE X 2)
Culture: NO GROWTH
Culture: NO GROWTH
SPECIAL REQUESTS: ADEQUATE
Special Requests: ADEQUATE

## 2018-11-26 MED ORDER — TECHNETIUM TC 99M PYROPHOSPHATE
21.0000 | Freq: Once | INTRAVENOUS | Status: AC
  Administered 2018-11-26: 21 via INTRAVENOUS
  Filled 2018-11-26: qty 21

## 2018-11-29 ENCOUNTER — Other Ambulatory Visit: Payer: Self-pay

## 2018-11-29 ENCOUNTER — Emergency Department (HOSPITAL_COMMUNITY): Payer: Medicare Other

## 2018-11-29 ENCOUNTER — Emergency Department (HOSPITAL_COMMUNITY)
Admission: EM | Admit: 2018-11-29 | Discharge: 2018-11-29 | Disposition: A | Discharge status: Home / Self Care | Payer: Medicare Other | Attending: Emergency Medicine | Admitting: Emergency Medicine

## 2018-11-29 ENCOUNTER — Encounter (HOSPITAL_COMMUNITY): Payer: Self-pay | Admitting: *Deleted

## 2018-11-29 DIAGNOSIS — Z7982 Long term (current) use of aspirin: Secondary | ICD-10-CM | POA: Diagnosis not present

## 2018-11-29 DIAGNOSIS — R748 Abnormal levels of other serum enzymes: Secondary | ICD-10-CM | POA: Insufficient documentation

## 2018-11-29 DIAGNOSIS — B372 Candidiasis of skin and nail: Secondary | ICD-10-CM | POA: Insufficient documentation

## 2018-11-29 DIAGNOSIS — Z7984 Long term (current) use of oral hypoglycemic drugs: Secondary | ICD-10-CM | POA: Insufficient documentation

## 2018-11-29 DIAGNOSIS — I5032 Chronic diastolic (congestive) heart failure: Secondary | ICD-10-CM | POA: Diagnosis not present

## 2018-11-29 DIAGNOSIS — N183 Chronic kidney disease, stage 3 (moderate): Secondary | ICD-10-CM | POA: Insufficient documentation

## 2018-11-29 DIAGNOSIS — E039 Hypothyroidism, unspecified: Secondary | ICD-10-CM | POA: Insufficient documentation

## 2018-11-29 DIAGNOSIS — R739 Hyperglycemia, unspecified: Secondary | ICD-10-CM | POA: Diagnosis not present

## 2018-11-29 DIAGNOSIS — J449 Chronic obstructive pulmonary disease, unspecified: Secondary | ICD-10-CM | POA: Diagnosis not present

## 2018-11-29 DIAGNOSIS — L03317 Cellulitis of buttock: Secondary | ICD-10-CM | POA: Insufficient documentation

## 2018-11-29 DIAGNOSIS — R21 Rash and other nonspecific skin eruption: Secondary | ICD-10-CM | POA: Diagnosis present

## 2018-11-29 DIAGNOSIS — I130 Hypertensive heart and chronic kidney disease with heart failure and stage 1 through stage 4 chronic kidney disease, or unspecified chronic kidney disease: Secondary | ICD-10-CM | POA: Diagnosis not present

## 2018-11-29 DIAGNOSIS — Z87891 Personal history of nicotine dependence: Secondary | ICD-10-CM | POA: Insufficient documentation

## 2018-11-29 DIAGNOSIS — R1110 Vomiting, unspecified: Secondary | ICD-10-CM | POA: Diagnosis not present

## 2018-11-29 LAB — LIPASE, BLOOD: Lipase: 120 U/L — ABNORMAL HIGH (ref 11–51)

## 2018-11-29 LAB — CBC WITH DIFFERENTIAL/PLATELET
ABS IMMATURE GRANULOCYTES: 0.03 10*3/uL (ref 0.00–0.07)
Basophils Absolute: 0 10*3/uL (ref 0.0–0.1)
Basophils Relative: 0 %
Eosinophils Absolute: 0.1 10*3/uL (ref 0.0–0.5)
Eosinophils Relative: 1 %
HEMATOCRIT: 43.4 % (ref 39.0–52.0)
HEMOGLOBIN: 13.2 g/dL (ref 13.0–17.0)
Immature Granulocytes: 0 %
LYMPHS PCT: 13 %
Lymphs Abs: 0.9 10*3/uL (ref 0.7–4.0)
MCH: 27.5 pg (ref 26.0–34.0)
MCHC: 30.4 g/dL (ref 30.0–36.0)
MCV: 90.4 fL (ref 80.0–100.0)
MONO ABS: 0.5 10*3/uL (ref 0.1–1.0)
MONOS PCT: 7 %
NEUTROS ABS: 5.5 10*3/uL (ref 1.7–7.7)
Neutrophils Relative %: 79 %
PLATELETS: 215 10*3/uL (ref 150–400)
RBC: 4.8 MIL/uL (ref 4.22–5.81)
RDW: 15.6 % — ABNORMAL HIGH (ref 11.5–15.5)
WBC: 7.1 10*3/uL (ref 4.0–10.5)
nRBC: 0 % (ref 0.0–0.2)

## 2018-11-29 LAB — CBG MONITORING, ED
GLUCOSE-CAPILLARY: 265 mg/dL — AB (ref 70–99)
Glucose-Capillary: 457 mg/dL — ABNORMAL HIGH (ref 70–99)
Glucose-Capillary: 328 mg/dL — ABNORMAL HIGH (ref 70–99)

## 2018-11-29 LAB — URINALYSIS, ROUTINE W REFLEX MICROSCOPIC
Bilirubin Urine: NEGATIVE
Hgb urine dipstick: NEGATIVE
KETONES UR: NEGATIVE mg/dL
Leukocytes, UA: NEGATIVE
Nitrite: NEGATIVE
PH: 5 (ref 5.0–8.0)
Protein, ur: NEGATIVE mg/dL
SPECIFIC GRAVITY, URINE: 1.013 (ref 1.005–1.030)

## 2018-11-29 LAB — COMPREHENSIVE METABOLIC PANEL
ALK PHOS: 94 U/L (ref 38–126)
ALT: 17 U/L (ref 0–44)
AST: 18 U/L (ref 15–41)
Albumin: 3.7 g/dL (ref 3.5–5.0)
Anion gap: 13 (ref 5–15)
BUN: 89 mg/dL — AB (ref 8–23)
CHLORIDE: 88 mmol/L — AB (ref 98–111)
CO2: 28 mmol/L (ref 22–32)
CREATININE: 1.91 mg/dL — AB (ref 0.61–1.24)
Calcium: 10 mg/dL (ref 8.9–10.3)
GFR calc Af Amer: 39 mL/min — ABNORMAL LOW (ref >60)
GFR, EST NON AFRICAN AMERICAN: 34 mL/min — AB (ref >60)
Glucose, Bld: 438 mg/dL — ABNORMAL HIGH (ref 70–99)
Potassium: 4.6 mmol/L (ref 3.5–5.1)
SODIUM: 129 mmol/L — AB (ref 135–145)
Total Bilirubin: 0.7 mg/dL (ref 0.3–1.2)
Total Protein: 8 g/dL (ref 6.5–8.1)

## 2018-11-29 MED ORDER — SODIUM CHLORIDE 0.9 % IV BOLUS
1000.0000 mL | Freq: Once | INTRAVENOUS | Status: AC
  Administered 2018-11-29: 1000 mL via INTRAVENOUS

## 2018-11-29 MED ORDER — CEPHALEXIN 500 MG PO CAPS: ORAL_CAPSULE | ORAL | 0 refills | Status: DC

## 2018-11-29 MED ORDER — KETOCONAZOLE 2 % EX CREA
TOPICAL_CREAM | Freq: Every day | CUTANEOUS | Status: DC
  Administered 2018-11-29: 16:00:00 via TOPICAL
  Filled 2018-11-29: qty 15

## 2018-11-29 MED ORDER — INSULIN ASPART 100 UNIT/ML ~~LOC~~ SOLN
10.0000 [IU] | Freq: Once | SUBCUTANEOUS | Status: AC
  Administered 2018-11-29: 10 [IU] via SUBCUTANEOUS

## 2018-11-29 MED ORDER — KETOCONAZOLE 2 % EX CREA: 1.0000 "application " | TOPICAL_CREAM | Freq: Every day | CUTANEOUS | 0 refills | Status: DC

## 2018-11-29 NOTE — ED Provider Notes (Signed)
  Physical Exam  BP 123/62   Pulse 72   Temp (!) 97.2 F (36.2 C) (Rectal)   Resp (!) 27   Ht 6\' 1"  (1.854 m)   Wt 114.2 kg   SpO2 93%   BMI 33.22 kg/m   Physical Exam  Constitutional: He is oriented to person, place, and time. He appears well-developed and well-nourished.  HENT:  Head: Normocephalic and atraumatic.  Cardiovascular: Normal rate.  Pulmonary/Chest: Effort normal and breath sounds normal.  Abdominal: Soft. Bowel sounds are normal. He exhibits no distension. There is no tenderness. There is no guarding.  Neurological: He is alert and oriented to person, place, and time.  Nursing note and vitals reviewed.   ED Course/Procedures   Clinical Course as of Nov 29 1656  Mon Nov 29, 2018  1547 Glucose, UA(!): >=500 [AH]  1631 Agent patient's lipase is elevated.  No previous documented lipase values in review of EMR.  Lipase(!): 120 [AH]  1632 BUN(!): 89 [AH]  1632 Creatinine(!): 1.91 [AH]  3254 Patient does not have anion gap acidosis.  No suggestion of DKA  Anion gap: 13 [AH]    Clinical Course User Index [AH] Margarita Mail, [name]    Procedures  MDM  Patient had CT A/P WO contrast that showed no abnormalities. Repeat BG shows decrease 265, no signs of DKA at this time. Patient tolerating PO without difficulty. Patient discharged with keflex and ketoconazole cream. Instructions were discussed with patient and daughter at the bedside. Patient will PCP for recheck for further evaluation and management. Patient given strict return precautions.        Erskine Squibb, MD 11/30/18 [id number]    Lajean Saver, MD 12/04/18 [phone]

## 2018-11-29 NOTE — ED Notes (Signed)
Patients wife requested phone number to be placed in chart - [phone].

## 2018-11-29 NOTE — ED Provider Notes (Addendum)
[facility] EMERGENCY DEPARTMENT Provider Note   CSN: [id number] Arrival date & time: 11/29/18  1307     History   Chief Complaint Chief Complaint  Patient presents with  . Rash  . Hyperglycemia    HPI Ir[place] P Lia is a 72 y.o. male sent in from his PCP for hyperglycemia. The patient has an extensive PMH of DM, chf, copd, degenerative spine disease, and htn. The paitent was recently hospitalized with UTI . This year the was diagnosed with staph/pseudomonal urinary tract infection.  The Pseudomonas was resistant to multiple bacteria's.  The patient was admitted in November of this year for complicated urinary tract infection and treated with parenteral antibiotics.  He has completed all of his antibiotics at this time.  The patient also developed a severe intertrigo and inguinal candidal rash believed to be secondary to his poorly controlled hyperglycemia and use of Jardiance which has been discontinued.  Patient began having diarrhea last night.  He had 2 episodes of diarrhea today and this morning had multiple episodes of retching without vomit.  The patient was seen by his primary care physician and was noted to have a very high blood glucose.  Patient has a home health nurse who noted that he seemed to have worsening of the rash in his gluteal cleft with development of some ulcerations and erythema of the left buttocks.  His PCP was also concern for potential cellulitis.  The patient continues to have suprapubic pelvic pain.  He has noticed discharge around the glans penis and urethra that he describes as cottage cheese like, and pain at the end of urination.  Patient has been otherwise well, afebrile at home without significant decline.  HPI  Past Medical History:  Diagnosis Date  . Cervical myelopathy (HCC)    with myelomalacia at C5-6 and C3-4  . CHF (congestive heart failure) ([place])   . Constipation - functional    controlled with miralax  . COPD (chronic  obstructive pulmonary disease) (HCC)    mild  . DJD (degenerative joint disease)    WHOLE SPINE  . Dyspnea   . Gait disorder   . Heart murmur    years ago   . Hypertension   . Hypothyroidism    Low d/t treatment of hyperthyroidism  . Lumbosacral spinal stenosis 03/13/2014  . On home oxygen therapy    "2.5L all the time" (06/29/2018)  . Peripheral neuropathy   . Pneumonia   . PONV (postoperative nausea and vomiting)   . RBBB   . Renal disorder    acute kidney failure  . Restless legs syndrome 03/14/2015  . RLS (restless legs syndrome)   . Sleep apnea    DX  2009/2010 study done at [facility]  . 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 Type II diabetes mellitus ([place])    diet controlled    Patient Active Problem List   Diagnosis Date Noted  . Pressure injury of skin 11/21/2018  . Complicated urinary tract infection 11/20/2018  . Acute encephalopathy 06/28/2018  . Oxygen desaturation   . Supplemental oxygen dependent   . Poorly controlled type 2 diabetes mellitus with peripheral neuropathy ([place])   . Labile blood pressure   . Labile blood glucose   . Myelopathy ([place]) 04/16/2018  . Benign essential HTN   . Benign prostatic hyperplasia with urinary retention   . Acute on chronic respiratory failure with hypoxia and hypercapnia (HCC)   . DDD (degenerative disc disease), cervical   .  Morbid obesity ([place])   . S/P lumbar spinal fusion 04/08/2018  . 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 Myelopathy, spondylogenic, cervical 11/11/2017  . Excessive daytime sleepiness 11/11/2017  . 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 Debility 10/08/2017  . Stage 3 chronic kidney disease ([place])   . Spinal stenosis of lumbar region   . Acute blood loss anemia   . Urinary tract infection without hematuria   . Chronic diastolic CHF (congestive heart failure) ([place])   . Elevated troponin I level   . Hypothyroid 08/19/2015  . Restless legs syndrome 03/14/2015   . Shortness of breath 03/09/2015  . Sinus bradycardia 09/21/2014  . Thoracic spondylosis with myelopathy 09/13/2014  . Cervical arthritis with myelopathy 07/18/2014  . Constipation - functional   . Sepsis ([place]) 07/10/2014  . OSA on CPAP 07/10/2014  . Edema leg 07/10/2014  . Lumbosacral spinal stenosis 03/13/2014  . Other vitamin B12 deficiency anemia 07/13/2013  . 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 Polyneuropathy in other diseases classified elsewhere ([place]) 02/17/2013  . Other general symptoms(780.99) 02/17/2013  . Abnormality of gait 02/17/2013  . Cervical spondylosis with myelopathy 02/17/2013  . Back ache 09/15/2012    Past Surgical History:  Procedure Laterality Date  . BACK SURGERY     6 surgeries  . CARDIAC CATHETERIZATION [name] 09/07/2015   Procedure: Right/Left Heart Cath and Coronary Angiography;  Surgeon: Peter M Martinique, MD;  Location: [facility];  Service: Cardiovascular;  Laterality: [name];  . CHOLECYSTECTOMY    . EYE SURGERY     BIL CATARACTS  . LUMBAR LAMINECTOMY/DECOMPRESSION MICRODISCECTOMY  07/14/2012   Procedure: LUMBAR LAMINECTOMY/DECOMPRESSION MICRODISCECTOMY 2 LEVELS;  Surgeon: Eustace Moore, MD;  Location: [facility];  Service: Neurosurgery;  Laterality: Left;  Lumbar two three laminectomy, left thoracic four five transpedicular diskectomy  . POSTERIOR CERVICAL FUSION/FORAMINOTOMY  04/08/2018   Procedure: Laminectomy and Foraminotomy - Lumbar Four-Lumbar Five, instrumented posterolateral fusion Lumbar Four- Five, Laminectomy and Foraminotomy - Thoracic One-Thoracic Two;  Surgeon: Eustace Moore, MD;  Location: [facility] OR;  Service: Neurosurgery;;  posterior  . POSTERIOR LUMBAR FUSION  04/08/2018   Laminectomy and Foraminotomy - Lumbar Four-Lumbar Five, instrumented posterolateral fusion Lumbar Four- Five, Laminectomy and Foraminotomy - Thoracic One-Thoracic Two  . POSTERIOR LUMBAR FUSION  04/08/2018   Laminectomy and Foraminotomy -  Lumbar Four-Lumbar Five, instrumented posterolateral fusion Lumbar Four- Five, Laminectomy and Foraminotomy - Thoracic One-Thoracic Two        Home Medications    Prior to Admission medications   Medication Sig Start Date End Date Taking? Authorizing Provider  aspirin EC 81 MG EC tablet Take 1 tablet (81 mg total) by mouth daily. 08/23/15   Thurnell Lose, MD  diphenhydrAMINE-zinc acetate (BENADRYL) cream Apply topically 3 (three) times daily as needed for itching. Apply to back 11/22/18   Rai, Ripudeep K, MD  docusate sodium (COLACE) 100 MG capsule Take 200 mg by mouth at bedtime.    [provider]  escitalopram (LEXAPRO) 10 MG tablet Take 1 tablet (10 mg total) by mouth daily. 05/12/18   Angiulli, Lavon Paganini, [name]  gabapentin (NEURONTIN) 400 MG capsule TAKE 1 CAPSULE (400 MG TOTAL) BY MOUTH EVERY 8 (EIGHT) HOURS. Patient taking differently: Take 400 mg by mouth 3 (three) times daily.  05/12/18   Angiulli, Lavon Paganini, [name]  glimepiride (AMARYL) 2 MG tablet Take 1 tablet (2 mg total) by mouth daily with breakfast. 05/13/18  Angiulli, Lavon Paganini, [name]  hydrALAZINE (APRESOLINE) 50 MG tablet Take 1.5 tablets (75 mg total) by mouth 3 (three) times daily. 07/23/18 11/20/18  Richardson Dopp T, [name]  levothyroxine (SYNTHROID, LEVOTHROID) 175 MCG tablet Take 1 tablet (175 mcg total) by mouth daily. 05/12/18   Angiulli, Lavon Paganini, [name]  metoprolol tartrate (LOPRESSOR) 25 MG tablet Take 0.5 tablets (12.5 mg total) by mouth daily. 08/16/18   Clegg, Amy D, NP  multivitamin (ONE-A-DAY MEN'S) TABS tablet Take 1 tablet by mouth at bedtime.    [provider]  polyethylene glycol (MIRALAX / GLYCOLAX) packet Take 17 g by mouth daily as needed. For constipation Patient taking differently: Take 17 g by mouth at bedtime.  08/09/14   Love, Ivan Anchors, [name]  potassium chloride SA (K-DUR,KLOR-CON) 20 MEQ tablet Take 2 tablets (40 mEq total) by mouth daily. 09/21/18   Georgiana Shore, NP  pramipexole (MIRAPEX)  0.5 MG tablet One tablet in the morning and 2 in the evening 11/24/18   Kathrynn Ducking, MD  Semaglutide,0.25 or 0.5MG /DOS, (OZEMPIC, 0.25 OR 0.5 MG/DOSE,) 2 MG/1.5ML SOPN Inject 0.25 mg into the skin every Wednesday.    [provider]  spironolactone (ALDACTONE) 25 MG tablet Take 1 tablet (25 mg total) by mouth daily. 10/29/18   Nahser, Wonda Cheng, MD  tamsulosin (FLOMAX) 0.4 MG CAPS capsule Take 1 capsule (0.4 mg total) by mouth daily. Patient taking differently: Take 0.4 mg by mouth at bedtime.  05/12/18   Angiulli, Lavon Paganini, [name]  torsemide (DEMADEX) 20 MG tablet Take 3 tablets (60 mg total) by mouth 2 (two) times daily. 09/03/18   Larey Dresser, MD  TOVIAZ 8 MG TB24 tablet Take 1 tablet (8 mg total) by mouth daily. 05/12/18   Angiulli, Lavon Paganini, [name]    Family History Family History  Problem Relation Age of Onset  . Polycythemia Mother   . Diabetes Father   . Hypertension Father   . Heart disease Father   . CAD Unknown        1 of his 4 brothers and father has CAD    Social History Social History   Tobacco Use  . Smoking status: Former Smoker    Packs/day: 2.00    Years: 20.00    Pack years: 40.00    Types: Cigarettes    Last attempt to quit: 06/28/1985    Years since quitting: 33.4  . Smokeless tobacco: Former Systems developer    Quit date: 06/28/1985  Substance Use Topics  . Alcohol use: No  . Drug use: No     Allergies   Amlodipine; Plendil [felodipine]; Compazine [prochlorperazine]; Cymbalta [duloxetine hcl]; Jardiance [empagliflozin]; Other; Levofloxacin in d5w; and Septra [sulfamethoxazole-trimethoprim]   Review of Systems Review of Systems Ten systems reviewed and are negative for acute change, except as noted in the HPI.    Physical Exam Updated Vital Signs BP (!) 171/88 (BP Location: Left Arm)   Pulse 69   Temp (!) 97.2 F (36.2 C) (Rectal)   Resp (!) 25   Ht 6\' 1"  (1.854 m)   Wt 114.2 kg   SpO2 95%   BMI 33.22 kg/m   Physical Exam Vitals signs and  nursing note reviewed.  Constitutional:      General: He is not in acute distress.    Appearance: He is well-developed and well-nourished. He is not diaphoretic.  HENT:     Head: Normocephalic and atraumatic.  Eyes:     General: No scleral icterus.  Extraocular Movements: EOM normal.     Conjunctiva/sclera: Conjunctivae normal.     Pupils: Pupils are equal, round, and reactive to light.  Neck:     Musculoskeletal: Normal range of motion and neck supple.  Cardiovascular:     Rate and Rhythm: Normal rate and regular rhythm.     Heart sounds: Normal heart sounds.  Pulmonary:     Effort: Pulmonary effort is normal. No respiratory distress.     Breath sounds: Normal breath sounds.  Abdominal:     General: There is no distension.     Palpations: Abdomen is soft.     Tenderness: There is no abdominal tenderness. There is no guarding.  Genitourinary:    Penis: Phimosis and discharge present.     Musculoskeletal:        General: No edema.  Skin:    General: Skin is warm and dry.     Comments: Angry erythematous edematous, well demarcated rash under the abdominal pannus and groin region consistent with candidiasis. There is macerated tissue breakdown in the gluteal cleft with small tear at the base of the coccyx. Tissue is hyperpigmented with several shallow ulcers above the anus on both sides   Neurological:     Mental Status: He is alert.  Psychiatric:        Behavior: Behavior normal.      ED Treatments / Results  Labs (all labs ordered are listed, but only abnormal results are displayed) Labs Reviewed  CBG MONITORING, ED - Abnormal; Notable for the following components:      Result Value   Glucose-Capillary 457 (*)    All other components within normal limits    EKG None  Radiology No results found.  Procedures Procedures (including critical care time)  Medications Ordered in ED Medications - No data to display   Initial Impression / Assessment and Plan /  ED Course  I have reviewed the triage vital signs and the nursing notes.  Pertinent labs & imaging results that were available during my care of the patient were reviewed by me and considered in my medical decision making (see chart for details).  Clinical Course as of Nov 29 1656  Mon Nov 29, 2018  1547 Glucose, UA(!): >=500 [AH]  1631 Agent patient's lipase is elevated.  No previous documented lipase values in review of EMR.  Lipase(!): 120 [AH]  1632 BUN(!): 89 [AH]  1632 Creatinine(!): 1.91 [AH]  7124 Patient does not have anion gap acidosis.  No suggestion of DKA  Anion gap: 13 [AH]    Clinical Course User Index [AH] Margarita Mail, [name]    Patient with hyperglycemia, candidal intertrigo, elevated lipase and cellulitis of the buttocks.  Patient has a CT scan pending and I have given sign out to Dr. Kela Millin who will assume care of the patient for appropriate discharge.  Final Clinical Impressions(s) / ED Diagnoses   Final diagnoses:  Hyperglycemia  Candidal intertrigo  Cellulitis of buttock  Elevated lipase    ED Discharge Orders    None       Margarita Mail, [name] 11/30/18 1652    Lajean Saver, MD 12/04/18 0705    Margarita Mail, [name] 01/16/19 0013    Lajean Saver, MD 01/20/19 1353

## 2018-11-29 NOTE — Discharge Instructions (Addendum)
Apply the ketoconazole cream to the gluteal cleft (crack of your bottom), groin, under the belly fold, and under the foreskin of the penis once daily.   Contact a health care provider if: You do not recover as quickly as expected. You develop new or worsening symptoms. You have persistent pain, weakness, or nausea. You recover and then have another episode of pain. You have a fever. Get help right away if: You cannot eat or keep fluids down. Your pain becomes severe. Your skin or the white part of your eyes turns yellow (jaundice). You vomit. You feel dizzy or you faint. Your blood sugar is high (over 300 mg/dL).

## 2018-11-29 NOTE — ED Notes (Signed)
Patient verbalizes understanding of medications and discharge instructions. No further questions at this time. VSS and patient ambulatory at discharge.   

## 2018-11-29 NOTE — ED Notes (Signed)
Fluids decreased to 125/h per vo Dr. Zenia Resides.

## 2018-11-29 NOTE — ED Triage Notes (Signed)
Pt sent here by pcp for elevated blood sugars in the 500's, sores to buttocks and diarrhea and vomiting this am.

## 2018-11-29 NOTE — ED Notes (Signed)
In and out cathed for 700cc clear yellow urine-- pt has irritation around foreskin and penis and in groin- states it is yeast from antibiotics

## 2018-11-30 LAB — URINE CULTURE: Culture: NO GROWTH

## 2018-11-30 NOTE — Progress Notes (Signed)
ADVANCED HF CLINIC NOTE  Referring Physician: Richardson Dopp PA Primary Care: Dr Doran Stabler  Primary Cardiologist: Dr Acie Fredrickson  Pulmonary: Dr Halford Chessman HF MD: Dr [name]  HPI: Travis [name] is a 72 y.o. male with a history of diastolic heart failure, HTN, hypothyroidism, OSA, DMII, COPD on chronic oxygen, and spinal stenosis.   Admitted 04/08/2018 for decompression lumber laminectomy. Hospital course was complicated by respiratory failure and severe deconditioning. He was discharged to [facility] and went home on 05/12/2018.   Admitted 7/1 through 07/04/18. Admitted with AMS due to CO2 retention/hypercarbic respiratory failure due to CO2 retention and A/C diastolic heart failure. BB decreased in the hospital due to bradycardic. Discharge weight 275 pounds.  Admitted 11/23 - 08/14 with complicated UTI (UCx with Pseudomonas). AKI noted on labs. Treated with IV ABX.   He presents today for post hospital follow up. At last office visit, Jardiance added. Since then, has been taken off due to complicated UTI as above. Seen in ED this week due to yeast rash in groin. Treated with anti-fungal cream. Taking torsemide 60 mg BID. Trying to watch his fluid intake more closely. Denies orthopnea. Sleeps in hospital bed with HOB elevated. Using BiPAP nightly. Scraped his left shin getting into the car and has a wound/skin tear.  HH following. Now doing HHPT and has a Physiological scientist, getting stronger. Using a walker.   Cardiac Testing  PYP scan 11/26/18 with Grade 1, H/CLL 1.45. Equivocal. Personally reviewed by Dr. [name]. Negative.  Echocardiogram 06/29/2018 EF 55-60, normal wall motion, grade 1 diastolic dysfunction Echocardiogram 03/06/17 Mild LVH, EF 65-70, normal wall motion, grade 2 diastolic dysfunction, mild LAE Echo 08/21/15 EF 55-60, normal wall motion, grade 2 diastolic dysfunction, mild LAE  R/L cardiac catheterization 09/07/15 LM 40 LAD distal 40 LCx ostial 50 RCA proximal 40 EF 55-60 LVEDP 16,  mean RA 4, PASP 45, mean PCWP 13  Nuclear stress test 03/19/15 EF 48, no ischemia, low risk  Review of systems complete and found to be negative unless listed in HPI.    Past Medical History:  Diagnosis Date  . Cervical myelopathy (HCC)    with myelomalacia at C5-6 and C3-4  . CHF (congestive heart failure) ([place])   . Constipation - functional    controlled with miralax  . COPD (chronic obstructive pulmonary disease) (HCC)    mild  . DJD (degenerative joint disease)    WHOLE SPINE  . Dyspnea   . Gait disorder   . Heart murmur    years ago   . Hypertension   . Hypothyroidism    Low d/t treatment of hyperthyroidism  . Lumbosacral spinal stenosis 03/13/2014  . On home oxygen therapy    "2.5L all the time" (06/29/2018)  . Peripheral neuropathy   . Pneumonia   . PONV (postoperative nausea and vomiting)   . RBBB   . Renal disorder    acute kidney failure  . Restless legs syndrome 03/14/2015  . RLS (restless legs syndrome)   . Sleep apnea    DX  2009/2010 [facility] done at [facility]  . 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 Type II diabetes mellitus (HCC)    diet controlled    Current Outpatient Medications  Medication Sig Dispense Refill  . aspirin EC 81 MG EC tablet Take 1 tablet (81 mg total) by mouth daily. 30 tablet 0  . cephALEXin (KEFLEX) 500 MG capsule Take 1,000 mg by mouth 2 (two) times daily.    [name]  diphenhydrAMINE-zinc acetate (BENADRYL) cream Apply topically 3 (three) times daily as needed for itching. Apply to back 28.4 g 0  . docusate sodium (COLACE) 100 MG capsule Take 200 mg by mouth at bedtime.    [name] escitalopram (LEXAPRO) 10 MG tablet Take 1 tablet (10 mg total) by mouth daily. 30 tablet 2  . gabapentin (NEURONTIN) 400 MG capsule TAKE 1 CAPSULE (400 MG TOTAL) BY MOUTH EVERY 8 (EIGHT) HOURS. (Patient taking differently: Take 400 mg by mouth 3 (three) times daily. ) 90 capsule 0  . glimepiride (AMARYL) 2 MG tablet Take 1 tablet (2 mg total) by mouth daily with  breakfast. 30 tablet 1  . hydrALAZINE (APRESOLINE) 50 MG tablet Take 1.5 tablets (75 mg total) by mouth 3 (three) times daily. 378 tablet 3  . ketoconazole (NIZORAL) 2 % cream Apply 1 application topically daily. 60 g 0  . levothyroxine (SYNTHROID, LEVOTHROID) 175 MCG tablet Take 1 tablet (175 mcg total) by mouth daily. 30 tablet 2  . metoprolol tartrate (LOPRESSOR) 25 MG tablet Take 0.5 tablets (12.5 mg total) by mouth daily. 45 tablet 3  . multivitamin (ONE-A-DAY MEN'S) TABS tablet Take 1 tablet by mouth at bedtime.    . polyethylene glycol (MIRALAX / GLYCOLAX) packet Take 17 g by mouth daily as needed. For constipation (Patient taking differently: Take 17 g by mouth at bedtime. ) 14 each 0  . potassium chloride SA (K-DUR,KLOR-CON) 20 MEQ tablet Take 2 tablets (40 mEq total) by mouth daily. 60 tablet 1  . pramipexole (MIRAPEX) 0.5 MG tablet One tablet in the morning and 2 in the evening 270 tablet 1  . Semaglutide,0.25 or 0.5MG /DOS, (OZEMPIC, 0.25 OR 0.5 MG/DOSE,) 2 MG/1.5ML SOPN Inject 0.25 mg into the skin every Wednesday.    [name] spironolactone (ALDACTONE) 25 MG tablet Take 1 tablet (25 mg total) by mouth daily. 90 tablet 3  . tamsulosin (FLOMAX) 0.4 MG CAPS capsule Take 1 capsule (0.4 mg total) by mouth daily. (Patient taking differently: Take 0.4 mg by mouth at bedtime. ) 30 capsule 0  . torsemide (DEMADEX) 20 MG tablet Take 3 tablets (60 mg total) by mouth 2 (two) times daily. 180 tablet 11  . TOVIAZ 8 MG TB24 tablet Take 1 tablet (8 mg total) by mouth daily. 30 tablet 11   No current facility-administered medications for this encounter.     Allergies  Allergen Reactions  . Amlodipine Swelling and Other (See Comments)    Causes edema (per [facility] paperwork)  . Plendil [Felodipine] Swelling and Other (See Comments)    Causes edema (per [facility] paperwork)  . Compazine [Prochlorperazine] Other (See Comments)    Caused agitation (per [facility] paperwork)  .  Cymbalta [Duloxetine Hcl] Other (See Comments)    Made the patient "feel weird," per [facility] paperwork  . Jardiance [Empagliflozin] Other (See Comments)    Stopped by MD because of unwanted side effects  . Other Nausea Only and Other (See Comments)    ANESTHESIA UNSPECIFIED- makes sick, must have anti-nausea medication in advance  . Levofloxacin In D5w Itching    Possible infusion reaction 03/29/17. Tolerating oral Levaquin 03/30/17  . Septra [Sulfamethoxazole-Trimethoprim] Nausea Only and Other (See Comments)    Per [facility] paperwork      Social History   Socioeconomic History  . Marital status: Married    Spouse name: carolyn  . Number of children: 3  . Years of education: 34  . Highest education level: Not on file  Occupational  History    Employer: [employer]    Comment: Works in [place]  . Financial resource strain: Not on file  . Food insecurity:    Worry: Not on file    Inability: Not on file  . Transportation needs:    Medical: Not on file    Non-medical: Not on file  Tobacco Use  . Smoking status: Former Smoker    Packs/day: 2.00    Years: 20.00    Pack years: 40.00    Types: Cigarettes    Last attempt to quit: 06/28/1985    Years since quitting: 33.4  . Smokeless tobacco: Former Systems developer    Quit date: 06/28/1985  Substance and Sexual Activity  . Alcohol use: No  . Drug use: No  . Sexual activity: Not on file  Lifestyle  . Physical activity:    Days per week: Not on file    Minutes per session: Not on file  . Stress: Not on file  Relationships  . Social connections:    Talks on phone: Not on file    Gets together: Not on file    Attends religious service: Not on file    Active member of club or organization: Not on file    Attends meetings of clubs or organizations: Not on file    Relationship status: Not on file  . Intimate partner violence:    Fear of current or ex partner: Not on file    Emotionally  abused: Not on file    Physically abused: Not on file    Forced sexual activity: Not on file  Other Topics Concern  . Not on file  Social History Narrative   Lives with wife,  [name]   Patient is married with 3 children.   Patient has 16 yrs of education.   Patient is right handed.   Patient drinks 2 cups of caffeine per day.      Family History  Problem Relation Age of Onset  . Polycythemia Mother   . Diabetes Father   . Hypertension Father   . Heart disease Father   . CAD Unknown        1 of his 4 brothers and father has CAD    Vitals:   12/03/18 1027  BP: (!) 144/76  Pulse: 72  SpO2: 91%  Weight: 121.1 kg (267 lb)  Height: 6\' 1"  (1.854 m)   Wt Readings from Last 3 Encounters:  12/03/18 121.1 kg (267 lb)  12/01/18 120.2 kg (265 lb)  11/29/18 114.2 kg (251 lb 12.3 oz)    PHYSICAL EXAM: General: Well appearing. No resp difficulty. HEENT: Normal Neck: Supple. JVP 5-6. Carotids 2+ bilat; no bruits. No thyromegaly or nodule noted. Cor: PMI nondisplaced. RRR, No M/G/R noted Lungs: CTAB, normal effort. Abdomen: Soft, non-tender, non-distended, no HSM. No bruits or masses. +BS  Extremities: No cyanosis, clubbing, or rash. R and LLE no edema.  Neuro: Alert & orientedx3, cranial nerves grossly intact. moves all 4 extremities w/o difficulty. Affect pleasant   ASSESSMENT & PLAN:  1. Chronic Diastolic Heart Failure with R>>L symptoms - ECHO 06/29/2018 EF 55-60% Grade IDD  - Suspect due to R-sided HF from OSA, OHS and COPD - Myeloma panel with no M-spike, normal IFE.  - NYHA III symptoms, confounded by inactivity.  - Volume status stable on exam.   - Continue torsemide 60 mg twice a day and spiro 25 daily - Intolerant to [place] with UTI and rash. -  PYP scan 10/2018 with Grade 1, H/CLL 1.45. Reviewed by Dr. [name] and determined to be negative.  - Reinforced fluid restriction to < 2 L daily, sodium restriction to less than 2000 mg daily, and the importance of  daily weights.     2.Chronic Hypoxic Respiratory Failure/COPD - Continue chronic O2.  - Now switched BiPAP to Dr. Halford Chessman  3. CKD Stage III -Creatinine baseline 1.91 11/29/18.  - BMET today.   4. HTN - Meds as above.   5. Hypothyroidism  - Per PCP.   6. Bradycardia - Stable.   7. OSA - Encouraged nightly BiPAP.  - Follows with Dr. Halford Chessman.   8. DM2 - Follows with Dr. Doran Stabler - Continue Jardiance. Further per Dr. Doran Stabler.   9. CAD - Non-obstructive - On ASA - Consider statin at next visit  10. Deconditioning - Continue HHPT. Will need continued maintenance once finished.   Doing well today after recent bout with UTI. HHPT following. RTC 4-6 weeks on APP side. Sooner with symptoms.   Shirley Friar, [name]  10:54 AM  Greater than 50% of the 25 minute visit was spent in counseling/coordination of care regarding disease state education, salt/fluid restriction, sliding scale diuretics, and medication compliance.

## 2018-12-01 ENCOUNTER — Encounter: Payer: Self-pay | Admitting: Cardiovascular Disease

## 2018-12-01 ENCOUNTER — Ambulatory Visit: Payer: Medicare Other | Admitting: Cardiovascular Disease

## 2018-12-01 VITALS — BP 112/60 | HR 68 | Ht 73.0 in | Wt 265.0 lb

## 2018-12-01 DIAGNOSIS — I5032 Chronic diastolic (congestive) heart failure: Secondary | ICD-10-CM

## 2018-12-01 NOTE — Patient Instructions (Signed)
Medication Instructions:  Your physician recommends that you continue on your current medications as directed. Please refer to the Current Medication list given to you today.  If you need a refill on your cardiac medications before your next appointment, please call your pharmacy.    Lab work: None Ordered  If you have labs (blood work) drawn today and your tests are completely normal, you will receive your results only by: [name] MyChart Message (if you have MyChart) OR . A paper copy in the mail If you have any lab test that is abnormal or we need to change your treatment, we will call you to review the results.   Testing/Procedures: None Ordered   Follow-Up: At [facility], you and your health needs are our priority.  As part of our continuing mission to provide you with exceptional heart care, we have created designated Provider Care Teams.  These Care Teams include your primary Cardiologist (physician) and Advanced Practice Providers (APPs -  Physician Assistants and Nurse Practitioners) who all work together to provide you with the care you need, when you need it. You will need a follow up appointment in:  6 months.  Please call our office 2 months in advance to schedule this appointment.  You may see Mertie Moores, MD or one of the following Advanced Practice Providers on your designated Care Team: Richardson Dopp, [name] [place], [place] . Daune Perch, NP

## 2018-12-01 NOTE — Progress Notes (Signed)
Cardiology Office Note   Date:  12/01/2018   ID:  Travis [name], Travis [name] Sep 29, 1946, MRN [id number]  PCP:  [name], MD  Cardiologist:   Mertie Moores, MD   Chief Complaint  Patient presents with  . Congestive Heart Failure   1. Hypertension 2. Hypothyroidism 3. DM 4. OSA 5. Mild pulmonary hypertension.  6. Chronic diastolic dysfunction   Notes prior to Sept. 2015  [name] is seen back today for a post hospital visit. He is the father - in -law of Travis [name]  ([employer] nurse) and Travis [name] ([place]) I saw him in consultatoin on July 17, 2014 for issues related to his diastolic dysfunction in the setting of pneumonia / respiratory failure He was seen By Truitt Merle in late August for follow up of his hospitalization.   [name] He is a 72 year old male with HTN, hypothyroidism, OSA, poorly controlled type 2 DM, former smoker and DJD.  Admitted back in July with CAP/UTI with associated sepsis. Was intubated due to progressive respiratory failure. Had AKI/ATN in the setting of PNA, hypoxic RF and septic shock. Felt to have some degree of diastolic HF - echo was not of good quality. Apparently has had past high sodium diet and uncontrolled HTN. Looks like he was on Lisinopril HCT, cardura and a lower dose of beta blocker. Mildly elevated troponin but no anginal symptoms and this was suspected to be from respiratory failure/CAP with septic shock.   Was seen by Cecille Rubin on August 28, Reported getting stronger, Breathing was ok. Leg edema has improved.   Sept. 9, 2015:  Travis [name] is seen today with his wife. Walking with a walker. Getting stronger every day.  Has cut out his high salt foods.  He i still working Administrator, [employer])  Has been seen by home health nurses - HR has been slow. No episodes of syncope.   Sept. 24, 2015:  Travis [name] is doing OK but continues to gradually gained strength. His home health nurse recently noted that his heart rate was  very slow. Occasionally have some episodes of very mild lightheadedness when he sat on the toilet type of bowel movement. He has not had episodes of syncope and seems to be doing pretty well at other times. He Has not had any syncope.   Dec. 11, 2015:  Travis [name] is here today for his HTN,  Has OSA - has been wearing his CPAP. , no CP and no dyspnea. Occasional eipisodes of orthostatic hypotension. Making progress with his rehab but he had to suspend it because of his elevated BP Does his best to avoid salt.  We reviewed his diet - no high salt items typically .  BP at the rehab is typically elevated but    March 09, 2015:   Travis [name] is a 72 y.o. male who presents for follow-up of his high blood pressure. He slowly getting stronger but still walks with the assistance of a walker. He has some left leg pain and he has had to re-start using his walker again - was using a cane.  Wears his CPAP at night.  BP yesterday was 469 -629 systolic .   He had some lightheadedness yesterday - glucose was ok.  BP was ok  August 17, 2015:  Doing well. Going to rehab -  BP looks great  No CP or dyspnea   Sept. 27, 2016: Doing well. Still has lots of issues with leg swelling .  Elevates his legs at night - legs are much better in the am.  Cath revealed mildly elevated right sided pressures. Has mild - moderate CAD  LV EF is 55-60%  We doubled his lasix to 40 BID for several days and felt much better .    Dec. 5, 2016: Travis [name] is seen today for follow up of his CHF. Seems to be getting stronger. Able to get around with a cane - which is an improvement  Doing some calf exercises which has helped the leg swelling .   July 17, 2016;  Travis [name] is seen today  Runs out of breath before he should  O2 sats here are 88% on room air.   [name] has a pulse-ox - his typical O2 sate is between 92%-94%.  Cath last year showed mild - mod irreg. Echo shows normal LV function with grade 2 diastolic  dysfunction . Not eating any extra salt   Jan. 22, 2018:  Travis [name] is seen today wife his wife.  Walking with a walker  Has had leg swelling which has now resolved.  Trying to avoid salt.     April 14, 2017:  Travis [name] was in the hospital in March for pneumonia  And CHF exacerbation .   He was also found to have a Pseudomonas urinary tract infection He was treated with IV antibiotics and diuretics and made a steady improvement after 3 days.  O2 saturations are 93% today on room air. His weight is 289 pounds.  Working on his diet.   Is eating out less.   No CP  Uses his CPAP,   Has restless legs.   July 14, 2017:  Travis [name] is seen today for work in visit for worsening CHF Travis [name] had increase in weight gain and leg swelling. Oxygen level was 83%. Normally takes Demadex 20 mg BID  He doubled his Demadex to 40 BID for 3 days and legs have returned to normal .    Breathing is back to baseline Legs are still weak  Feb. 8, 2019: Travis [name] is seen for office visit .  Seen with wife Travis [name])  and daughter Travis [name]) .  Just saw pulmonary -  O2 sats are low 90s  No recent hospitalizations   Is not able to stand, spending lots of time in the wheelchair .  Unable to walk.   December 01, 2018: Travis [name] is seen today with his wife Travis [name] and his daughter Travis [name]. Recently in the hospital with urosepsis.  Wt today is 265 lbs  Was placed on Jardiance by Dr. [name]  Was on for 3 weeks and then was hospitalized with UTI and now has a groin yeast infection .  Leg edema has resolved.     Past Medical History:  Diagnosis Date  . Cervical myelopathy (HCC)    with myelomalacia at C5-6 and C3-4  . CHF (congestive heart failure) ([place])   . Constipation - functional    controlled with miralax  . COPD (chronic obstructive pulmonary disease) (HCC)    mild  . DJD (degenerative joint disease)    WHOLE SPINE  . Dyspnea   . Gait disorder   . Heart murmur    years ago   . Hypertension   . Hypothyroidism     Low d/t treatment of hyperthyroidism  . Lumbosacral spinal stenosis 03/13/2014  . On home oxygen therapy    "2.5L all the time" (06/29/2018)  . Peripheral neuropathy   . Pneumonia   . PONV (postoperative nausea  and vomiting)   . RBBB   . Renal disorder    acute kidney failure  . Restless legs syndrome 03/14/2015  . RLS (restless legs syndrome)   . Sleep apnea    DX  2009/2010 [facility] done at [facility]  . 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 Type II diabetes mellitus ([place])    diet controlled    Past Surgical History:  Procedure Laterality Date  . BACK SURGERY     6 surgeries  . CARDIAC CATHETERIZATION [name] 09/07/2015   Procedure: Right/Left Heart Cath and Coronary Angiography;  Surgeon: Peter M Martinique, MD;  Location: [facility];  Service: Cardiovascular;  Laterality: [name];  . CHOLECYSTECTOMY    . EYE SURGERY     BIL CATARACTS  . LUMBAR LAMINECTOMY/DECOMPRESSION MICRODISCECTOMY  07/14/2012   Procedure: LUMBAR LAMINECTOMY/DECOMPRESSION MICRODISCECTOMY 2 LEVELS;  Surgeon: Eustace Moore, MD;  Location: [facility];  Service: Neurosurgery;  Laterality: Left;  Lumbar two three laminectomy, left thoracic four five transpedicular diskectomy  . POSTERIOR CERVICAL FUSION/FORAMINOTOMY  04/08/2018   Procedure: Laminectomy and Foraminotomy - Lumbar Four-Lumbar Five, instrumented posterolateral fusion Lumbar Four- Five, Laminectomy and Foraminotomy - Thoracic One-Thoracic Two;  Surgeon: Eustace Moore, MD;  Location: [facility];  Service: Neurosurgery;;  posterior  . POSTERIOR LUMBAR FUSION  04/08/2018   Laminectomy and Foraminotomy - Lumbar Four-Lumbar Five, instrumented posterolateral fusion Lumbar Four- Five, Laminectomy and Foraminotomy - Thoracic One-Thoracic Two  . POSTERIOR LUMBAR FUSION  04/08/2018   Laminectomy and Foraminotomy - Lumbar Four-Lumbar Five, instrumented posterolateral fusion Lumbar Four- Five, Laminectomy and Foraminotomy - Thoracic One-Thoracic Two     Current Outpatient  Medications  Medication Sig Dispense Refill  . aspirin EC 81 MG EC tablet Take 1 tablet (81 mg total) by mouth daily. 30 tablet 0  . cephALEXin (KEFLEX) 500 MG capsule Take 1,000 mg by mouth 2 (two) times daily.    . diphenhydrAMINE-zinc acetate (BENADRYL) cream Apply topically 3 (three) times daily as needed for itching. Apply to back 28.4 g 0  . docusate sodium (COLACE) 100 MG capsule Take 200 mg by mouth at bedtime.    [name] escitalopram (LEXAPRO) 10 MG tablet Take 1 tablet (10 mg total) by mouth daily. 30 tablet 2  . gabapentin (NEURONTIN) 400 MG capsule TAKE 1 CAPSULE (400 MG TOTAL) BY MOUTH EVERY 8 (EIGHT) HOURS. (Patient taking differently: Take 400 mg by mouth 3 (three) times daily. ) 90 capsule 0  . glimepiride (AMARYL) 2 MG tablet Take 1 tablet (2 mg total) by mouth daily with breakfast. 30 tablet 1  . hydrALAZINE (APRESOLINE) 50 MG tablet Take 1.5 tablets (75 mg total) by mouth 3 (three) times daily. 378 tablet 3  . ketoconazole (NIZORAL) 2 % cream Apply 1 application topically daily. 60 g 0  . levothyroxine (SYNTHROID, LEVOTHROID) 175 MCG tablet Take 1 tablet (175 mcg total) by mouth daily. 30 tablet 2  . metoprolol tartrate (LOPRESSOR) 25 MG tablet Take 0.5 tablets (12.5 mg total) by mouth daily. 45 tablet 3  . multivitamin (ONE-A-DAY MEN'S) TABS tablet Take 1 tablet by mouth at bedtime.    . polyethylene glycol (MIRALAX / GLYCOLAX) packet Take 17 g by mouth daily as needed. For constipation (Patient taking differently: Take 17 g by mouth at bedtime. ) 14 each 0  . potassium chloride SA (K-DUR,KLOR-CON) 20 MEQ tablet Take 2 tablets (40 mEq total) by mouth daily. 60 tablet 1  . pramipexole (MIRAPEX) 0.5 MG tablet One tablet  in the morning and 2 in the evening 270 tablet 1  . Semaglutide,0.25 or 0.5MG /DOS, (OZEMPIC, 0.25 OR 0.5 MG/DOSE,) 2 MG/1.5ML SOPN Inject 0.25 mg into the skin every Wednesday.    [name] spironolactone (ALDACTONE) 25 MG tablet Take 1 tablet (25 mg total) by mouth daily. 90  tablet 3  . tamsulosin (FLOMAX) 0.4 MG CAPS capsule Take 1 capsule (0.4 mg total) by mouth daily. (Patient taking differently: Take 0.4 mg by mouth at bedtime. ) 30 capsule 0  . torsemide (DEMADEX) 20 MG tablet Take 3 tablets (60 mg total) by mouth 2 (two) times daily. 180 tablet 11  . TOVIAZ 8 MG TB24 tablet Take 1 tablet (8 mg total) by mouth daily. 30 tablet 11   No current facility-administered medications for this visit.     Allergies:   Amlodipine; Plendil [felodipine]; Compazine [prochlorperazine]; Cymbalta [duloxetine hcl]; Jardiance [empagliflozin]; Other; Levofloxacin in d5w; and Septra [sulfamethoxazole-trimethoprim]    Social History:  The patient  reports that he quit smoking about 33 years ago. His smoking use included cigarettes. He has a 40.00 pack-year smoking history. He quit smokeless tobacco use about 33 years ago. He reports that he does not drink alcohol or use drugs.   Family History:  The patient's family history includes CAD in his unknown relative; Diabetes in his father; Heart disease in his father; Hypertension in his father; Polycythemia in his mother.    ROS:    Noted in current hx. Otherwise negative   Physical Exam: Blood pressure 112/60, pulse 68, height 6\' 1"  (1.854 m), weight 265 lb (120.2 kg), SpO2 91 %.  GEN:  Well nourished, well developed in no acute distress HEENT: Normal NECK: No JVD; No carotid bruits LYMPHATICS: No lymphadenopathy CARDIAC: RRR  RESPIRATORY:  Clear to auscultation without rales, wheezing or rhonchi  ABDOMEN: Soft, non-tender, non-distended MUSCULOSKELETAL:  No edema   SKIN: Warm and dry NEUROLOGIC:  Alert and oriented x 3   EKG:     Recent Labs: 06/29/2018: TSH 1.171 07/04/2018: Magnesium 2.2 10/27/2018: B Natriuretic Peptide 38.1 11/29/2018: ALT 17; BUN 89; Creatinine, Ser 1.91; Hemoglobin 13.2; Platelets 215; Potassium 4.6; Sodium 129    Lipid Panel    Component Value Date/Time   CHOL 124 08/20/2015 1458   TRIG 82  08/20/2015 1458   HDL 29 (L) 08/20/2015 1458   CHOLHDL 4.3 08/20/2015 1458   VLDL 16 08/20/2015 1458   LDLCALC 79 08/20/2015 1458      Wt Readings from Last 3 Encounters:  12/01/18 265 lb (120.2 kg)  11/29/18 251 lb 12.3 oz (114.2 kg)  11/21/18 251 lb 12.3 oz (114.2 kg)      Other studies Reviewed: Additional studies/ records that were reviewed today include: . Review of the above records demonstrates:    ASSESSMENT AND PLAN:  1.  Chronic diastolic congestive heart failure:    Seems to be doing very well.  He has been seen by Dr. Phillip Heal.    He was tried on Jardiance but developed severe urinary tract infection as well as a fungal infection of his groin.  Not entirely sure if the Jardiance contributed to this but it is possible.  See him again in 6 months.  He will call me sooner if needed..     2.  Hypertension:   BP is stable   3. OSA Needs to have his BiPAP mask replaced.  Current medicines are reviewed at length with the patient today.  The patient does not have concerns regarding medicines.  Labs/ tests ordered today include:      Signed, Mertie Moores, MD  12/01/2018 10:07 AM    [facility] [address], [place], [place]  [address] Phone: [phone]; Fax: [phone]

## 2018-12-03 ENCOUNTER — Other Ambulatory Visit: Payer: Self-pay

## 2018-12-03 ENCOUNTER — Ambulatory Visit (HOSPITAL_COMMUNITY)
Admission: RE | Admit: 2018-12-03 | Discharge: 2018-12-03 | Disposition: A | Discharge status: Home / Self Care | Payer: Medicare Other | Source: Ambulatory Visit | Attending: Internal Medicine | Admitting: Internal Medicine

## 2018-12-03 ENCOUNTER — Encounter (HOSPITAL_COMMUNITY): Payer: Self-pay

## 2018-12-03 VITALS — BP 144/76 | HR 72 | Ht 73.0 in | Wt 267.0 lb

## 2018-12-03 DIAGNOSIS — E1122 Type 2 diabetes mellitus with diabetic chronic kidney disease: Secondary | ICD-10-CM | POA: Diagnosis not present

## 2018-12-03 DIAGNOSIS — R001 Bradycardia, unspecified: Secondary | ICD-10-CM | POA: Insufficient documentation

## 2018-12-03 DIAGNOSIS — Z882 Allergy status to sulfonamides status: Secondary | ICD-10-CM | POA: Insufficient documentation

## 2018-12-03 DIAGNOSIS — I2510 Atherosclerotic heart disease of native coronary artery without angina pectoris: Secondary | ICD-10-CM

## 2018-12-03 DIAGNOSIS — E1140 Type 2 diabetes mellitus with diabetic neuropathy, unspecified: Secondary | ICD-10-CM | POA: Insufficient documentation

## 2018-12-03 DIAGNOSIS — I4510 Unspecified right bundle-branch block: Secondary | ICD-10-CM | POA: Insufficient documentation

## 2018-12-03 DIAGNOSIS — J449 Chronic obstructive pulmonary disease, unspecified: Secondary | ICD-10-CM | POA: Insufficient documentation

## 2018-12-03 DIAGNOSIS — Z888 Allergy status to other drugs, medicaments and biological substances status: Secondary | ICD-10-CM | POA: Diagnosis not present

## 2018-12-03 DIAGNOSIS — I5032 Chronic diastolic (congestive) heart failure: Secondary | ICD-10-CM

## 2018-12-03 DIAGNOSIS — N183 Chronic kidney disease, stage 3 unspecified: Secondary | ICD-10-CM

## 2018-12-03 DIAGNOSIS — Z881 Allergy status to other antibiotic agents status: Secondary | ICD-10-CM | POA: Diagnosis not present

## 2018-12-03 DIAGNOSIS — Z7984 Long term (current) use of oral hypoglycemic drugs: Secondary | ICD-10-CM | POA: Diagnosis not present

## 2018-12-03 DIAGNOSIS — I10 Essential (primary) hypertension: Secondary | ICD-10-CM

## 2018-12-03 DIAGNOSIS — E039 Hypothyroidism, unspecified: Secondary | ICD-10-CM | POA: Insufficient documentation

## 2018-12-03 DIAGNOSIS — I130 Hypertensive heart and chronic kidney disease with heart failure and stage 1 through stage 4 chronic kidney disease, or unspecified chronic kidney disease: Secondary | ICD-10-CM | POA: Diagnosis present

## 2018-12-03 DIAGNOSIS — Z9981 Dependence on supplemental oxygen: Secondary | ICD-10-CM | POA: Diagnosis not present

## 2018-12-03 DIAGNOSIS — Z79899 Other long term (current) drug therapy: Secondary | ICD-10-CM | POA: Insufficient documentation

## 2018-12-03 DIAGNOSIS — Z9989 Dependence on other enabling machines and devices: Secondary | ICD-10-CM

## 2018-12-03 DIAGNOSIS — G4733 Obstructive sleep apnea (adult) (pediatric): Secondary | ICD-10-CM | POA: Diagnosis not present

## 2018-12-03 DIAGNOSIS — G2581 Restless legs syndrome: Secondary | ICD-10-CM | POA: Insufficient documentation

## 2018-12-03 DIAGNOSIS — Z87891 Personal history of nicotine dependence: Secondary | ICD-10-CM | POA: Insufficient documentation

## 2018-12-03 DIAGNOSIS — J9611 Chronic respiratory failure with hypoxia: Secondary | ICD-10-CM

## 2018-12-03 DIAGNOSIS — Z7982 Long term (current) use of aspirin: Secondary | ICD-10-CM | POA: Diagnosis not present

## 2018-12-03 DIAGNOSIS — Z884 Allergy status to anesthetic agent status: Secondary | ICD-10-CM | POA: Insufficient documentation

## 2018-12-03 DIAGNOSIS — R5381 Other malaise: Secondary | ICD-10-CM

## 2018-12-03 MED ORDER — KETOCONAZOLE 2 % EX CREA: 1.0000 "application " | TOPICAL_CREAM | Freq: Every day | CUTANEOUS | 0 refills | Status: DC

## 2018-12-03 MED ORDER — POTASSIUM CHLORIDE CRYS ER 10 MEQ PO TBCR: 20.0000 meq | EXTENDED_RELEASE_TABLET | Freq: Every day | ORAL | 3 refills | Status: DC

## 2018-12-03 NOTE — Patient Instructions (Signed)
DECREASE Potassium to 20 mEQ (2 tabs) daily  Your physician recommends that you schedule a follow-up appointment in: 4-6 weeks  in the Advanced Practitioners (PA/NP) Clinic    Do the following things EVERYDAY: 1) Weigh yourself in the morning before breakfast. Write it down and keep it in a log. 2) Take your medicines as prescribed 3) Eat low salt foods-Limit salt (sodium) to 2000 mg per day.  4) Stay as active as you can everyday 5) Limit all fluids for the day to less than 2 liters   At the [facility], you and your health needs are our priority. As part of our continuing mission to provide you with exceptional heart care, we have created designated Provider Care Teams. These Care Teams include your primary Cardiologist (physician) and Advanced Practice Providers (APPs- Physician Assistants and Nurse Practitioners) who all work together to provide you with the care you need, when you need it.   You may see any of the following providers on your designated Care Team at your next follow up: [name] Dr Glori Bickers . Dr Loralie Champagne . Darrick Grinder, NP . Lillia Mountain, NP . Rebecca Eaton

## 2018-12-06 ENCOUNTER — Encounter: Payer: Self-pay | Admitting: Pulmonary Disease

## 2018-12-06 ENCOUNTER — Ambulatory Visit: Payer: Medicare Other | Admitting: Pulmonary Disease

## 2018-12-06 VITALS — BP 140/64 | HR 76

## 2018-12-06 DIAGNOSIS — J986 Disorders of diaphragm: Secondary | ICD-10-CM

## 2018-12-06 DIAGNOSIS — J984 Other disorders of lung: Secondary | ICD-10-CM | POA: Diagnosis not present

## 2018-12-06 DIAGNOSIS — G4733 Obstructive sleep apnea (adult) (pediatric): Secondary | ICD-10-CM

## 2018-12-06 DIAGNOSIS — J9612 Chronic respiratory failure with hypercapnia: Secondary | ICD-10-CM

## 2018-12-06 DIAGNOSIS — E662 Morbid (severe) obesity with alveolar hypoventilation: Secondary | ICD-10-CM | POA: Diagnosis not present

## 2018-12-06 DIAGNOSIS — J9611 Chronic respiratory failure with hypoxia: Secondary | ICD-10-CM

## 2018-12-06 NOTE — Patient Instructions (Signed)
Will have your Bipap setting changed to 18/14 cm H2O  Follow up in 6 months

## 2018-12-06 NOTE — Progress Notes (Signed)
[name]  Chief Complaint  Patient presents with  . Follow-up    States since he got his new mask his Bi-pap. He states it dries him out with sore throat adn dry nasal passages. Currently on Keflex for cellutis and yeast.     Constitutional:  BP 140/64   Pulse 76   SpO2 91%   Past Medical History:  Cervical myelopathy, CHF, DJD, HTN, Hypothyroidism, Spinal stenosis, Peripheral neuropathy, PNA, RBBB, RLS, DM  Brief Summary:  Travis [name] is a 72 y.o. male former smoker with OSA, OHS, and chronic hypoxic/hypercapnic respiratory failure.  He feels that Bipap helps.  He is only using 3 liters oxygen at night with Bipap.  Not using oxygen during the day.  His main issue with Bipap is that his mouth gets dry.  He also has to tighten the straps to keep mask from leaking.  This causes soreness in the back of his head and his neck.  Physical Exam:   Appearance - well kempt   ENMT - clear nasal mucosa, midline nasal  septum, no oral exudates, no LAN, trachea midline  Respiratory - normal chest wall, normal respiratory effort, no accessory muscle use, no wheeze/rales  CV - s1s2 regular rate and rhythm, no murmurs, no peripheral edema, radial pulses symmetric  GI - soft, non tender, no masses  Lymph - no adenopathy noted in neck and axillary areas  MSK - uses wheelchair  Ext - no cyanosis, clubbing, or joint inflammation noted  Skin - no rashes, lesions, or ulcers  Neuro - has movement in upper extremities  Psych - normal mood and affect   Assessment/Plan:   Obstructive sleep apnea. 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 -  discussed importance of maintaining his weight  Chronic diastolic dysfunction. - f/u with Dr. Sung Amabile with cardiology  Cervical myelopathy with gait disorder. - f/u with neurology  Patient Instructions  Will have your Bipap setting changed to 18/14 cm H2O  Follow up in 6 months    [name], MD [place] Pager: [phone] 12/06/2018, 9:48 AM  Flow Sheet     Pulmonary tests:  CT angio chest 10/02/17 >> elevated Rt diaphragm (reviewed by me) PFT 10/07/17 >> FEV1 1.69 (47%), FEV1% 77, TLC 4.22 (55%), DLCO 35%, +BD ABG 07/04/18 >> pH 7.41, PCO2 65.6, PO2 66.7  Sleep tests:  HST 12/02/17 >> AHI 20.6, SaO2 low 84% Bipap 09/14/18 >> failed CPAP; Bipap 20/16 cm H2O + 3 liters O2 >> AHI 2, +R. Bipap 11/06/18 to 12/05/18 >> used on 28 of 30 nights with average 8 hrs 14 min.  Average AHI 7.1 with Bipap 20/16 cm H2O  Cardiac tests:  Echo 06/29/18 >> EF 55 to 60%, grade 1 DD  Medications:   Allergies as of 12/06/2018      Reactions   Amlodipine Swelling, Other (See Comments)   Causes edema (per [facility] paperwork)   Plendil [felodipine] Swelling, Other (See Comments)   Causes edema (per [facility] paperwork)   Compazine [prochlorperazine] Other (See Comments)   Caused agitation (per [facility] paperwork)   Cymbalta [duloxetine Hcl] Other (See Comments)   Made the patient "feel weird," per  [facility] paperwork   Jardiance [empagliflozin] Other (See Comments)   Stopped by MD because of unwanted side effects   Other Nausea Only, Other (See Comments)   ANESTHESIA UNSPECIFIED- makes sick, must have anti-nausea medication in advance   Levofloxacin In D5w Itching   Possible infusion reaction 03/29/17. Tolerating oral Levaquin 03/30/17   Septra [sulfamethoxazole-trimethoprim] Nausea Only, Other (See Comments)   Per [facility] paperwork      Medication List        Accurate as of 12/06/18  9:48 AM. Always use your most recent  med list.          aspirin 81 MG EC tablet Take 1 tablet (81 mg total) by mouth daily.   cephALEXin 500 MG capsule Commonly known as:  KEFLEX Take 1,000 mg by mouth 2 (two) times daily.   diphenhydrAMINE-zinc acetate cream Commonly known as:  BENADRYL Apply topically 3 (three) times daily as needed for itching. Apply to back   docusate sodium 100 MG capsule Commonly known as:  COLACE Take 200 mg by mouth at bedtime.   escitalopram 10 MG tablet Commonly known as:  LEXAPRO Take 1 tablet (10 mg total) by mouth daily.   gabapentin 400 MG capsule Commonly known as:  NEURONTIN TAKE 1 CAPSULE (400 MG TOTAL) BY MOUTH EVERY 8 (EIGHT) HOURS.   glimepiride 2 MG tablet Commonly known as:  AMARYL Take 1 tablet (2 mg total) by mouth daily with breakfast.   hydrALAZINE 50 MG tablet Commonly known as:  APRESOLINE Take 1.5 tablets (75 mg total) by mouth 3 (three) times daily.   ketoconazole 2 % cream Commonly known as:  NIZORAL Apply 1 application topically daily.   levothyroxine 175 MCG tablet Commonly known as:  SYNTHROID, LEVOTHROID Take 1 tablet (175 mcg total) by mouth daily.   metoprolol tartrate 25 MG tablet Commonly known as:  LOPRESSOR Take 0.5 tablets (12.5 mg total) by mouth daily.   multivitamin Tabs tablet Take 1 tablet by mouth at bedtime.   OZEMPIC (0.25 OR 0.5 MG/DOSE) 2 MG/1.5ML Sopn Generic drug:  Semaglutide(0.25 or 0.5MG /DOS) Inject 0.25 mg into the skin every Wednesday.   polyethylene glycol packet Commonly known as:  MIRALAX / GLYCOLAX Take 17 g by mouth daily as needed. For constipation   potassium chloride 10 MEQ tablet Commonly known as:  K-DUR,KLOR-CON Take 2 tablets (20 mEq total) by mouth daily.   pramipexole 0.5 MG tablet Commonly known as:  MIRAPEX One tablet in the morning and 2 in the evening   spironolactone 25 MG tablet Commonly known as:  ALDACTONE Take 1 tablet (25 mg total) by mouth daily.   tamsulosin 0.4 MG Caps  capsule Commonly known as:  FLOMAX Take 1 capsule (0.4 mg total) by mouth daily.   torsemide 20 MG tablet Commonly known as:  DEMADEX Take 3 tablets (60 mg total) by mouth 2 (two) times daily.   TOVIAZ 8 MG Tb24 tablet Generic drug:  fesoterodine Take 1 tablet (8 mg total) by mouth daily.       Past Surgical History:  He  has a past surgical history that includes Cholecystectomy; Lumbar laminectomy/decompression microdiscectomy (07/14/2012); Cardiac catheterization ([name], 09/07/2015); Eye surgery; Posterior lumbar fusion (04/08/2018); Posterior lumbar fusion (04/08/2018); Back surgery; and Posterior cervical fusion/foraminotomy (04/08/2018).  Family History:  His family history includes CAD in his unknown relative; Diabetes in his father; Heart disease in his father; Hypertension in his father; Polycythemia in his mother.  Social History:  He  reports that he  quit smoking about 33 years ago. His smoking use included cigarettes. He has a 40.00 pack-year smoking history. He quit smokeless tobacco use about 33 years ago. He reports that he does not drink alcohol or use drugs.

## 2018-12-30 DIAGNOSIS — M4714 Other spondylosis with myelopathy, thoracic region: Secondary | ICD-10-CM | POA: Diagnosis not present

## 2018-12-30 DIAGNOSIS — G4733 Obstructive sleep apnea (adult) (pediatric): Secondary | ICD-10-CM | POA: Diagnosis not present

## 2018-12-30 DIAGNOSIS — Z9181 History of falling: Secondary | ICD-10-CM | POA: Diagnosis not present

## 2018-12-30 DIAGNOSIS — N184 Chronic kidney disease, stage 4 (severe): Secondary | ICD-10-CM | POA: Diagnosis not present

## 2018-12-30 DIAGNOSIS — E039 Hypothyroidism, unspecified: Secondary | ICD-10-CM | POA: Diagnosis not present

## 2018-12-30 DIAGNOSIS — M5000 Cervical disc disorder with myelopathy, unspecified cervical region: Secondary | ICD-10-CM | POA: Diagnosis not present

## 2018-12-30 DIAGNOSIS — B9689 Other specified bacterial agents as the cause of diseases classified elsewhere: Secondary | ICD-10-CM | POA: Diagnosis not present

## 2018-12-30 DIAGNOSIS — J449 Chronic obstructive pulmonary disease, unspecified: Secondary | ICD-10-CM | POA: Diagnosis not present

## 2018-12-30 DIAGNOSIS — E1122 Type 2 diabetes mellitus with diabetic chronic kidney disease: Secondary | ICD-10-CM | POA: Diagnosis not present

## 2018-12-30 DIAGNOSIS — E669 Obesity, unspecified: Secondary | ICD-10-CM | POA: Diagnosis not present

## 2018-12-30 DIAGNOSIS — Z792 Long term (current) use of antibiotics: Secondary | ICD-10-CM | POA: Diagnosis not present

## 2018-12-30 DIAGNOSIS — I5032 Chronic diastolic (congestive) heart failure: Secondary | ICD-10-CM | POA: Diagnosis not present

## 2018-12-30 DIAGNOSIS — N390 Urinary tract infection, site not specified: Secondary | ICD-10-CM | POA: Diagnosis not present

## 2018-12-30 DIAGNOSIS — I130 Hypertensive heart and chronic kidney disease with heart failure and stage 1 through stage 4 chronic kidney disease, or unspecified chronic kidney disease: Secondary | ICD-10-CM | POA: Diagnosis not present

## 2018-12-30 DIAGNOSIS — L89152 Pressure ulcer of sacral region, stage 2: Secondary | ICD-10-CM | POA: Diagnosis not present

## 2018-12-30 DIAGNOSIS — B965 Pseudomonas (aeruginosa) (mallei) (pseudomallei) as the cause of diseases classified elsewhere: Secondary | ICD-10-CM | POA: Diagnosis not present

## 2018-12-30 DIAGNOSIS — M1990 Unspecified osteoarthritis, unspecified site: Secondary | ICD-10-CM | POA: Diagnosis not present

## 2018-12-30 DIAGNOSIS — E1142 Type 2 diabetes mellitus with diabetic polyneuropathy: Secondary | ICD-10-CM | POA: Diagnosis not present

## 2018-12-30 DIAGNOSIS — R338 Other retention of urine: Secondary | ICD-10-CM | POA: Diagnosis not present

## 2018-12-30 DIAGNOSIS — N401 Enlarged prostate with lower urinary tract symptoms: Secondary | ICD-10-CM | POA: Diagnosis not present

## 2018-12-30 DIAGNOSIS — Z7982 Long term (current) use of aspirin: Secondary | ICD-10-CM | POA: Diagnosis not present

## 2018-12-30 DIAGNOSIS — Z452 Encounter for adjustment and management of vascular access device: Secondary | ICD-10-CM | POA: Diagnosis not present

## 2018-12-30 DIAGNOSIS — G2581 Restless legs syndrome: Secondary | ICD-10-CM | POA: Diagnosis not present

## 2018-12-30 DIAGNOSIS — Z87891 Personal history of nicotine dependence: Secondary | ICD-10-CM | POA: Diagnosis not present

## 2018-12-30 DIAGNOSIS — M4807 Spinal stenosis, lumbosacral region: Secondary | ICD-10-CM | POA: Diagnosis not present

## 2018-12-30 NOTE — Progress Notes (Signed)
Name: Travis [name]  MRN/ DOB: [id number], [birth year]   Age/ Sex: 73 y.o., male    PCP: [name], MD   Reason for Endocrinology Evaluation: Type 2 Diabetes Mellitus     Date of Initial Endocrinology Visit: 12/31/2018     PATIENT IDENTIFIER: Travis [name] is a 73 y.o. male with a past medical history of OSA, RLS, W0JW, diastolic CHF and postablative hypothyroidism. The patient presented for initial endocrinology clinic visit on 12/31/2018 for consultative assistance with his diabetes management.    HPI: Mr. [place] was    Diagnosed with T2DM for the past 2 yrs Prior Medications tried/Intolerance: Glimepiride , Ozempic - no side effects but persistent hyperglycemia , lantus was started a few weeks ago.  Currently checking blood sugars 1 x / day,  before breakfast. Hypoglycemia episodes : 0            Hemoglobin A1c has ranged from 6.7% in 2016, peaking at 11.3%  In 11/21/18 Patient required assistance for hypoglycemia: no Patient has required hospitalization within the last 1 year from hyper or hypoglycemia: Yes   In terms of diet, the patient avpids sugar sweetened beverages, snacks twice a day , eats 3 meals a day.   Pt went to the ED with BG's in the 500's mg/dL and severe genital candidal intertrigo while on Jardiance in December, 2019.   HOME DIABETES REGIMEN: Lantus 64 units daily   Statin: No ACE-I/ARB: No Prior Diabetic Education: No   METER DOWNLOAD SUMMARY: Did not bring     DIABETIC COMPLICATIONS: Microvascular complications:   Neuropathy, CKD III  Denies: retinopathy   Last eye exam: Completed 2019  Macrovascular complications:    Denies: CAD, PVD, CVA   PAST HISTORY: Past Medical History:  Past Medical History:  Diagnosis Date  . Cervical myelopathy (HCC)    with myelomalacia at C5-6 and C3-4  . CHF (congestive heart failure) ([place])   . Constipation - functional    controlled with miralax  . COPD (chronic obstructive pulmonary  disease) (HCC)    mild  . DJD (degenerative joint disease)    WHOLE SPINE  . Dyspnea   . Gait disorder   . Heart murmur    years ago   . Hypertension   . Hypothyroidism    Low d/t treatment of hyperthyroidism  . Lumbosacral spinal stenosis 03/13/2014  . On home oxygen therapy    "2.5L all the time" (06/29/2018)  . Peripheral neuropathy   . Pneumonia   . PONV (postoperative nausea and vomiting)   . RBBB   . Renal disorder    acute kidney failure  . Restless legs syndrome 03/14/2015  . RLS (restless legs syndrome)   . Sleep apnea    DX  2009/2010 [facility] done at [facility]  . 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 Type II diabetes mellitus ([place])    diet controlled   Past Surgical History:  Past Surgical History:  Procedure Laterality Date  . BACK SURGERY     6 surgeries  . CARDIAC CATHETERIZATION [name] 09/07/2015   Procedure: Right/Left Heart Cath and Coronary Angiography;  Surgeon: Peter M Martinique, MD;  Location: [facility];  Service: Cardiovascular;  Laterality: [name];  . CHOLECYSTECTOMY    . EYE SURGERY     BIL CATARACTS  . LUMBAR LAMINECTOMY/DECOMPRESSION MICRODISCECTOMY  07/14/2012   Procedure: LUMBAR LAMINECTOMY/DECOMPRESSION MICRODISCECTOMY 2 LEVELS;  Surgeon: Eustace Moore, MD;  Location: [facility];  Service:  Neurosurgery;  Laterality: Left;  Lumbar two three laminectomy, left thoracic four five transpedicular diskectomy  . POSTERIOR CERVICAL FUSION/FORAMINOTOMY  04/08/2018   Procedure: Laminectomy and Foraminotomy - Lumbar Four-Lumbar Five, instrumented posterolateral fusion Lumbar Four- Five, Laminectomy and Foraminotomy - Thoracic One-Thoracic Two;  Surgeon: Eustace Moore, MD;  Location: [facility] OR;  Service: Neurosurgery;;  posterior  . POSTERIOR LUMBAR FUSION  04/08/2018   Laminectomy and Foraminotomy - Lumbar Four-Lumbar Five, instrumented posterolateral fusion Lumbar Four- Five, Laminectomy and Foraminotomy - Thoracic One-Thoracic Two  . POSTERIOR LUMBAR FUSION   04/08/2018   Laminectomy and Foraminotomy - Lumbar Four-Lumbar Five, instrumented posterolateral fusion Lumbar Four- Five, Laminectomy and Foraminotomy - Thoracic One-Thoracic Two      Social History:  reports that he quit smoking about 33 years ago. His smoking use included cigarettes. He has a 40.00 pack-year smoking history. He quit smokeless tobacco use about 33 years ago. He reports that he does not drink alcohol or use drugs. Family History:  Family History  Problem Relation Age of Onset  . Polycythemia Mother   . Diabetes Father   . Hypertension Father   . Heart disease Father   . CAD Unknown        1 of his 4 brothers and father has CAD     HOME MEDICATIONS: Allergies as of 12/31/2018      Reactions   Amlodipine Swelling, Other (See Comments)   Causes edema (per [facility] paperwork)   Plendil [felodipine] Swelling, Other (See Comments)   Causes edema (per [facility] paperwork)   Compazine [prochlorperazine] Other (See Comments)   Caused agitation (per [facility] paperwork)   Cymbalta [duloxetine Hcl] Other (See Comments)   Made the patient "feel weird," per [facility] paperwork   Jardiance [empagliflozin] Other (See Comments)   Stopped by MD because of unwanted side effects   Other Nausea Only, Other (See Comments)   ANESTHESIA UNSPECIFIED- makes sick, must have anti-nausea medication in advance   Levofloxacin In D5w Itching   Possible infusion reaction 03/29/17. Tolerating oral Levaquin 03/30/17   Septra [sulfamethoxazole-trimethoprim] Nausea Only, Other (See Comments)   Per [facility] paperwork      Medication List       Accurate as of December 31, 2018  8:46 AM. Always use your most recent med list.        aspirin 81 MG EC tablet Take 1 tablet (81 mg total) by mouth daily.   docusate sodium 100 MG capsule Commonly known as:  COLACE Take 200 mg by mouth at bedtime.   escitalopram 10 MG tablet Commonly known as:  LEXAPRO Take 1  tablet (10 mg total) by mouth daily.   gabapentin 400 MG capsule Commonly known as:  NEURONTIN TAKE 1 CAPSULE (400 MG TOTAL) BY MOUTH EVERY 8 (EIGHT) HOURS.   hydrALAZINE 50 MG tablet Commonly known as:  APRESOLINE Take 1.5 tablets (75 mg total) by mouth 3 (three) times daily.   insulin glargine 100 UNIT/ML injection Commonly known as:  LANTUS Inject 64 Units into the skin daily.   ketoconazole 2 % cream Commonly known as:  NIZORAL Apply 1 application topically daily.   levothyroxine 175 MCG tablet Commonly known as:  SYNTHROID, LEVOTHROID Take 1 tablet (175 mcg total) by mouth daily.   metoprolol tartrate 25 MG tablet Commonly known as:  LOPRESSOR Take 0.5 tablets (12.5 mg total) by mouth daily.   multivitamin Tabs tablet Take 1 tablet by mouth at bedtime.   polyethylene glycol packet Commonly  known as:  MIRALAX / GLYCOLAX Take 17 g by mouth daily as needed. For constipation   potassium chloride 10 MEQ tablet Commonly known as:  K-DUR,KLOR-CON Take 2 tablets (20 mEq total) by mouth daily.   pramipexole 0.5 MG tablet Commonly known as:  MIRAPEX One tablet in the morning and 2 in the evening   spironolactone 25 MG tablet Commonly known as:  ALDACTONE Take 1 tablet (25 mg total) by mouth daily.   tamsulosin 0.4 MG Caps capsule Commonly known as:  FLOMAX Take 1 capsule (0.4 mg total) by mouth daily.   torsemide 20 MG tablet Commonly known as:  DEMADEX Take 3 tablets (60 mg total) by mouth 2 (two) times daily.   TOVIAZ 8 MG Tb24 tablet Generic drug:  fesoterodine Take 1 tablet (8 mg total) by mouth daily.        ALLERGIES: Allergies  Allergen Reactions  . Amlodipine Swelling and Other (See Comments)    Causes edema (per [facility] paperwork)  . Plendil [Felodipine] Swelling and Other (See Comments)    Causes edema (per [facility] paperwork)  . Compazine [Prochlorperazine] Other (See Comments)    Caused agitation (per [facility]  paperwork)  . Cymbalta [Duloxetine Hcl] Other (See Comments)    Made the patient "feel weird," per [facility] paperwork  . Jardiance [Empagliflozin] Other (See Comments)    Stopped by MD because of unwanted side effects  . Other Nausea Only and Other (See Comments)    ANESTHESIA UNSPECIFIED- makes sick, must have anti-nausea medication in advance  . Levofloxacin In D5w Itching    Possible infusion reaction 03/29/17. Tolerating oral Levaquin 03/30/17  . Septra [Sulfamethoxazole-Trimethoprim] Nausea Only and Other (See Comments)    Per [facility] paperwork     REVIEW OF SYSTEMS: A comprehensive ROS was conducted with the patient and is negative except as per HPI and below:  Review of Systems  Constitutional: Positive for malaise/fatigue. Negative for weight loss.  HENT: Negative for congestion and sore throat.   Eyes: Negative for blurred vision and pain.  Respiratory: Negative for cough and shortness of breath.   Cardiovascular: Negative for chest pain and palpitations.  Gastrointestinal: Negative for diarrhea and nausea.  Genitourinary: Positive for frequency.  Musculoskeletal: Negative for falls.  Skin: Negative.   Neurological: Positive for tingling. Negative for tremors.  Endo/Heme/Allergies: Positive for polydipsia.      OBJECTIVE:   VITAL SIGNS: BP 134/62 (BP Location: Right Arm, Patient Position: Sitting, Cuff Size: Large)   Pulse 96   Ht 6\' 1"  (1.854 m)   Wt 270 lb 3.2 oz (122.6 kg)   SpO2 (!) 89% Comment: Uses O2 prn  BMI 35.65 kg/m    PHYSICAL EXAM:  General: Pt appears well and is in NAD,pt examined in a wheel chair   Hydration: Well-hydrated with moist mucous membranes and good skin turgor  HEENT: Head: Unremarkable with good dentition. Oropharynx clear without exudate.  Eyes: External eye exam normal without stare, lid lag or exophthalmos.  EOM intact.    Neck: General: Supple without adenopathy or carotid bruits. Thyroid: Thyroid size normal.  No  goiter or nodules appreciated. No thyroid bruit.  Lungs: Clear with good BS bilat with no rales, rhonchi, or wheezes  Heart: RRR with normal S1 and S2 and no gallops; no murmurs; no rub  Abdomen: Normoactive bowel sounds, soft, nontender, without masses or organomegaly palpable  Extremities:  Lower extremities - trace pretibial edema.   Skin: Normal texture and temperature to  palpation. No rash noted.   Neuro: MS is good with appropriate affect, pt is alert and Ox3    DM foot exam:  The skin of the feet is intact without sores or ulcerations. The toe nails are thickened and discolored.  The pedal pulses are 2+ on right and 2+ on left. The sensation is intact to a screening 5.07, 10 gram monofilament bilaterally  DATA REVIEWED:  Lab Results  Component Value Date   HGBA1C 11.2 (H) 11/22/2018   HGBA1C 11.3 (H) 11/21/2018   HGBA1C 7.7 (H) 03/02/2018   Lab Results  Component Value Date   LDLCALC 79 08/20/2015   CREATININE 1.91 (H) 11/29/2018   No results found for: [facility]  Lab Results  Component Value Date   CHOL 124 08/20/2015   HDL 29 (L) 08/20/2015   LDLCALC 79 08/20/2015   TRIG 82 08/20/2015   CHOLHDL 4.3 08/20/2015        ASSESSMENT / PLAN / RECOMMENDATIONS:   1) Type 2 Diabetes Mellitus, poorly controlled, With neuropathic and renal (CKD III) complications - Most recent A1c of 11.2 %. Goal A1c < 7.0 %.    Plan: GENERAL: I have discussed with the patient the pathophysiology of diabetes. We went over the natural progression of the disease. We talked about both insulin resistance and insulin deficiency. We stressed the importance of lifestyle changes including diet and exercise. I explained the complications associated with diabetes including retinopathy, nephropathy, neuropathy as well as increased risk of cardiovascular disease. We went over the benefit seen with glycemic control.    I explained to the patient that diabetic patients are at higher than normal risk  for amputations. The patient was informed that diabetes is the number one cause of non-traumatic amputations in [place].  I also have explained to him that controlling his sugar now, may help slow down the progression of his neuropathy and CKD. Discussed pharmacokinetics of basal/bolus insulin and the importance of taking prandial insulin with meals.   We also discussed avoiding sugar-sweetened beverages and snacks, when possible.   Unfortunately due to his renal function, he is not a candidate for Metformin, SU or SGLT-2 inhibitors.   He failed a trial of Ozempic.   I think the best and safest way to improve his glucose at this time is to start him on prandial insulin in addition to the basal insulin.   He was advised to check glucose 4x a day so he can qualify for freestyle Libre    MEDICATIONS:    EDUCATION / INSTRUCTIONS:  BG monitoring instructions: Patient is instructed to check his blood sugars 4 times a day, before meals and bedtime   Call [place] [facility] if: BG persistently < 70 or > 300. . I reviewed the Rule of 15 for the treatment of hypoglycemia in detail with the patient. Literature supplied.   2) Diabetic complications:   Eye: Does not have known diabetic retinopathy.   Neuro/ Feet: Does have known diabetic peripheral neuropathy.  Renal: Patient does have known baseline CKD. He is not on an ACEI/ARB at present.   3) Lipids: Patient is not on a statin. His last LDL is below 100 mg/dL .    4) Hypertension: He is  at goal of < 140/90 mmHg.     F/U in 3 weeks    Signed electronically by: Mack Guise, MD  [facility]  [facility] [address] [place], [place] [address] Phone: [phone] FAX: [phone]  CC: [name], MD [address] [place] [address] Phone: [phone]  Fax: [phone]    Return to Endocrinology clinic as below: Future Appointments    Date Time Provider [place]  01/05/2019  9:00 AM [facility] PA/NP [facility] None  06/21/2019 10:00 AM Kathrynn Ducking, MD [facility] None

## 2018-12-31 ENCOUNTER — Encounter: Payer: Self-pay | Admitting: Internal Medicine

## 2018-12-31 ENCOUNTER — Ambulatory Visit: Payer: Medicare Other | Admitting: Internal Medicine

## 2018-12-31 VITALS — BP 134/62 | HR 96 | Ht 73.0 in | Wt 270.2 lb

## 2018-12-31 DIAGNOSIS — E1165 Type 2 diabetes mellitus with hyperglycemia: Secondary | ICD-10-CM | POA: Diagnosis not present

## 2018-12-31 DIAGNOSIS — Z794 Long term (current) use of insulin: Secondary | ICD-10-CM

## 2018-12-31 DIAGNOSIS — E1122 Type 2 diabetes mellitus with diabetic chronic kidney disease: Secondary | ICD-10-CM

## 2018-12-31 DIAGNOSIS — E1142 Type 2 diabetes mellitus with diabetic polyneuropathy: Secondary | ICD-10-CM | POA: Diagnosis not present

## 2018-12-31 DIAGNOSIS — N183 Chronic kidney disease, stage 3 (moderate): Secondary | ICD-10-CM

## 2018-12-31 LAB — GLUCOSE, POCT (MANUAL RESULT ENTRY): POC Glucose: 386 mg/dl — AB (ref 70–99)

## 2018-12-31 MED ORDER — INSULIN GLARGINE 100 UNIT/ML ~~LOC~~ SOLN: 40.0000 [IU] | Freq: Every day | SUBCUTANEOUS | 6 refills | Status: DC

## 2018-12-31 MED ORDER — INSULIN ASPART 100 UNIT/ML FLEXPEN: PEN_INJECTOR | SUBCUTANEOUS | 11 refills | Status: DC

## 2018-12-31 MED ORDER — INSULIN PEN NEEDLE 32G X 6 MM MISC: 6 refills | Status: DC

## 2018-12-31 NOTE — Patient Instructions (Addendum)
-   Decrease Lantus to 40 units  Daily  - Novolog 4 units with Breakfast, 6 units with Lunch and Supper  - Check sugar 4 times day (befre meals and bedtime) - Choose healthy, lower carb lower calorie snacks: toss salad, cooked vegetables, cottage cheese, peanut butter, low fat cheese / string cheese, lower sodium deli meat, tuna salad or chicken salad  - HOW TO TREAT LOW BLOOD SUGARS (Blood sugar LESS THAN 70 MG/DL)  Please follow the RULE OF 15 for the treatment of hypoglycemia treatment (when your (blood sugars are less than 70 mg/dL)    STEP 1: Take 15 grams of carbohydrates when your blood sugar is low, which includes:   3-4 GLUCOSE TABS  OR  3-4 OZ OF JUICE OR REGULAR SODA OR  ONE TUBE OF GLUCOSE GEL     STEP 2: RECHECK blood sugar in 15 MINUTES STEP 3: If your blood sugar is still low at the 15 minute recheck --> then, go back to STEP 1 and treat AGAIN with another 15 grams of carbohydrates.

## 2019-01-04 DIAGNOSIS — R350 Frequency of micturition: Secondary | ICD-10-CM | POA: Diagnosis not present

## 2019-01-04 DIAGNOSIS — B3742 Candidal balanitis: Secondary | ICD-10-CM | POA: Diagnosis not present

## 2019-01-04 DIAGNOSIS — R3915 Urgency of urination: Secondary | ICD-10-CM | POA: Diagnosis not present

## 2019-01-04 NOTE — Progress Notes (Signed)
ADVANCED HF CLINIC NOTE  Referring Physician: Richardson Dopp PA Primary Care: Dr Doran Stabler  Primary Cardiologist: Dr Acie Fredrickson  Pulmonary: Dr Halford Chessman HF MD: Dr [name]  HPI: Travis [name] is a 73 y.o. 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   Admitted 04/08/2018 for decompression lumber laminectomy. Hospital course was complicated by respiratory failure and severe deconditioning. He was discharged to [facility] and went home on 05/12/2018.   Admitted 7/1 through 07/04/18. Admitted with AMS due to CO2 retention/hypercarbic respiratory failure due to CO2 retention and A/C diastolic heart failure. BB decreased in the hospital due to bradycardic. Discharge weight 275 pounds.  Admitted 11/23 - 09/60 with complicated UTI (UCx with Pseudomonas). AKI noted on labs. Treated with IV ABX.   He presents today for regular follow up. No med changes last visit. Overall doing about the same. He rides his stationary bike 3x/week for 15 minutes. He gets SOB with exercise and with transfers, none at rest. He has some BLE edema that appears throughout the day, but none when he wakes up in the morning. Sleeps in a hospital bed with HOB elevated at baseline. Wearing BIPAP qHS. Appetite and energy level are good. No CP or dizziness. Drinking a lot of fluid because his mouth stays dry. Taking all medications, but missed torsemide today. He has to go to a funeral after appointment. His brother died over the weekend. [facility] is no longer following him. [facility] has been in contact about setting up appointment. He monitors O2 at home and says it is always >90%.  Cardiac Testing  PYP scan 11/26/18 with Grade 1, H/CLL 1.45. Equivocal. Personally reviewed by Dr. [name]. Negative.  Echocardiogram 06/29/2018 EF 55-60, normal wall motion, grade 1 diastolic dysfunction Echocardiogram 03/06/17 Mild LVH, EF 65-70, normal wall motion, grade 2  diastolic dysfunction, mild LAE Echo 08/21/15 EF 55-60, normal wall motion, grade 2 diastolic dysfunction, mild LAE  R/L cardiac catheterization 09/07/15 LM 40 LAD distal 40 LCx ostial 50 RCA proximal 40 EF 55-60 LVEDP 16, mean RA 4, PASP 45, mean PCWP 13  Nuclear stress test 03/19/15 EF 48, no ischemia, low risk  Review of systems complete and found to be negative unless listed in HPI.   Past Medical History:  Diagnosis Date  . Cervical myelopathy (HCC)    with myelomalacia at C5-6 and C3-4  . CHF (congestive heart failure) ([place])   . Constipation - functional    controlled with miralax  . COPD (chronic obstructive pulmonary disease) (HCC)    mild  . DJD (degenerative joint disease)    WHOLE SPINE  . Dyspnea   . Gait disorder   . Heart murmur    years ago   . Hypertension   . Hypothyroidism    Low d/t treatment of hyperthyroidism  . Lumbosacral spinal stenosis 03/13/2014  . On home oxygen therapy    "2.5L all the time" (06/29/2018)  . Peripheral neuropathy   . Pneumonia   . PONV (postoperative nausea and vomiting)   . RBBB   . Renal disorder    acute kidney failure  . Restless legs syndrome 03/14/2015  . RLS (restless legs syndrome)   . Sleep apnea    DX  2009/2010 study done at [facility]  . 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 Type II diabetes mellitus (HCC)    diet controlled    Current Outpatient Medications  Medication Sig  Dispense Refill  . aspirin EC 81 MG EC tablet Take 1 tablet (81 mg total) by mouth daily. 30 tablet 0  . docusate sodium (COLACE) 100 MG capsule Take 200 mg by mouth at bedtime.    [name] escitalopram (LEXAPRO) 10 MG tablet Take 1 tablet (10 mg total) by mouth daily. 30 tablet 2  . gabapentin (NEURONTIN) 400 MG capsule TAKE 1 CAPSULE (400 MG TOTAL) BY MOUTH EVERY 8 (EIGHT) HOURS. (Patient taking differently: Take 400 mg by mouth 3 (three) times daily. ) 90 capsule 0  . insulin aspart (NOVOLOG FLEXPEN) 100 UNIT/ML FlexPen Inject 4 Units into the  skin daily with breakfast AND 6 Units 2 (two) times daily before lunch and supper. 15 mL 11  . insulin glargine (LANTUS) 100 UNIT/ML injection Inject 0.4 mLs (40 Units total) into the skin daily. 15 mL 6  . Insulin Pen Needle (BD PEN NEEDLE MICRO U/F) 32G X 6 MM MISC Four times daily 150 each 6  . ketoconazole (NIZORAL) 2 % cream Apply 1 application topically daily. 120 g 0  . levothyroxine (SYNTHROID, LEVOTHROID) 175 MCG tablet Take 1 tablet (175 mcg total) by mouth daily. 30 tablet 2  . metoprolol tartrate (LOPRESSOR) 25 MG tablet Take 0.5 tablets (12.5 mg total) by mouth daily. 45 tablet 3  . multivitamin (ONE-A-DAY MEN'S) TABS tablet Take 1 tablet by mouth at bedtime.    . polyethylene glycol (MIRALAX / GLYCOLAX) packet Take 17 g by mouth daily as needed. For constipation (Patient taking differently: Take 17 g by mouth at bedtime. ) 14 each 0  . potassium chloride SA (K-DUR,KLOR-CON) 10 MEQ tablet Take 2 tablets (20 mEq total) by mouth daily. 60 tablet 3  . pramipexole (MIRAPEX) 0.5 MG tablet One tablet in the morning and 2 in the evening 270 tablet 1  . spironolactone (ALDACTONE) 25 MG tablet Take 1 tablet (25 mg total) by mouth daily. 90 tablet 3  . tamsulosin (FLOMAX) 0.4 MG CAPS capsule Take 1 capsule (0.4 mg total) by mouth daily. (Patient taking differently: Take 0.4 mg by mouth at bedtime. ) 30 capsule 0  . torsemide (DEMADEX) 20 MG tablet Take 3 tablets (60 mg total) by mouth 2 (two) times daily. 180 tablet 11  . TOVIAZ 8 MG TB24 tablet Take 1 tablet (8 mg total) by mouth daily. 30 tablet 11  . hydrALAZINE (APRESOLINE) 50 MG tablet Take 1.5 tablets (75 mg total) by mouth 3 (three) times daily. 378 tablet 3   No current facility-administered medications for this encounter.     Allergies  Allergen Reactions  . Amlodipine Swelling and Other (See Comments)    Causes edema (per [facility] paperwork)  . Plendil [Felodipine] Swelling and Other (See Comments)    Causes edema (per  [facility] paperwork)  . Compazine [Prochlorperazine] Other (See Comments)    Caused agitation (per [facility] paperwork)  . Cymbalta [Duloxetine Hcl] Other (See Comments)    Made the patient "feel weird," per [facility] paperwork  . Jardiance [Empagliflozin] Other (See Comments)    Stopped by MD because of unwanted side effects  . Other Nausea Only and Other (See Comments)    ANESTHESIA UNSPECIFIED- makes sick, must have anti-nausea medication in advance  . Levofloxacin In D5w Itching    Possible infusion reaction 03/29/17. Tolerating oral Levaquin 03/30/17  . Septra [Sulfamethoxazole-Trimethoprim] Nausea Only and Other (See Comments)    Per [facility] paperwork      Social History   Socioeconomic History  .  Marital status: Married    Spouse name: carolyn  . Number of children: 3  . Years of education: 71  . Highest education level: Not on file  Occupational History    Employer: [employer]: Works in [place]  . Financial resource strain: Not on file  . Food insecurity:    Worry: Not on file    Inability: Not on file  . Transportation needs:    Medical: Not on file    Non-medical: Not on file  Tobacco Use  . Smoking status: Former Smoker    Packs/day: 2.00    Years: 20.00    Pack years: 40.00    Types: Cigarettes    Last attempt to quit: 06/28/1985    Years since quitting: 33.5  . Smokeless tobacco: Former Systems developer    Quit date: 06/28/1985  Substance and Sexual Activity  . Alcohol use: No  . Drug use: No  . Sexual activity: Not on file  Lifestyle  . Physical activity:    Days per week: Not on file    Minutes per session: Not on file  . Stress: Not on file  Relationships  . Social connections:    Talks on phone: Not on file    Gets together: Not on file    Attends religious service: Not on file    Active member of club or organization: Not on file    Attends meetings of clubs or organizations: Not  on file    Relationship status: Not on file  . Intimate partner violence:    Fear of current or ex partner: Not on file    Emotionally abused: Not on file    Physically abused: Not on file    Forced sexual activity: Not on file  Other Topics Concern  . Not on file  Social History Narrative   Lives with wife,  [name]   Patient is married with 3 children.   Patient has 16 yrs of education.   Patient is right handed.   Patient drinks 2 cups of caffeine per day.      Family History  Problem Relation Age of Onset  . Polycythemia Mother   . Diabetes Father   . Hypertension Father   . Heart disease Father   . CAD Unknown        1 of his 4 brothers and father has CAD    Vitals:   01/05/19 0915  BP: 138/82  Pulse: 72  SpO2: 93%  Weight: 122.9 kg (270 lb 14.4 oz)   Wt Readings from Last 3 Encounters:  01/05/19 122.9 kg (270 lb 14.4 oz)  12/31/18 122.6 kg (270 lb 3.2 oz)  12/03/18 121.1 kg (267 lb)    PHYSICAL EXAM: General: No resp difficulty. Arrived in wheelchair HEENT: Normal Neck: Supple. JVP 8-10. Carotids 2+ bilat; no bruits. No thyromegaly or nodule noted. Cor: PMI nondisplaced. RRR, No M/G/R noted Lungs: CTAB, normal effort. Abdomen: Soft, non-tender, +distended, no HSM. No bruits or masses. +BS  Extremities: No cyanosis, clubbing, or rash. R and LLE 1+ ankle edema Neuro: Alert & orientedx3, cranial nerves grossly intact. moves all 4 extremities w/o difficulty. Affect pleasant   ASSESSMENT & PLAN:  1. Chronic Diastolic Heart Failure with R>>L symptoms - ECHO 06/29/2018 EF 55-60% Grade IDD  - Suspect due to R-sided HF from OSA, OHS and COPD - Myeloma panel with no M-spike, normal IFE.  - NYHA III symptoms, confounded by inactivity.  -  Volume status elevated on exam in setting of excess fluid intake.  - Increase torsemide to 80 mg BID x 2 days, then back to 60 mg BID. He can take an extra 20 mg PRN. BMET today. - Continue spiro 25 daily - Intolerant to  [place] with UTI and rash. 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 Reviewed by Dr. [name] and determined to be negative.  - Reinforced fluid restriction to < 2 L daily, sodium restriction to less than 2000 mg daily, and the importance of daily weights.    2.Chronic Hypoxic Respiratory Failure/COPD - Continue 3L O2 qHS. He was initially 89% on arrival, but improved to 93%. We discussed that he may need to start wearing O2 during the day at least PRN if he is <90%. He will monitor with pulse ox at home.  - Now switched BiPAP to Dr. Halford Chessman  3. CKD Stage III - Creatinine baseline 1.91 11/29/18.  - BMET today.   4. HTN - Mildly elevated today in setting of volume overload. - Continue hydral 75 mg TID.   5. Hypothyroidism  - Per PCP.   6. Bradycardia - Stable. HR 72 today  7. OSA - Encouraged nightly BiPAP. Recent adjustments.  - Follows with Dr. Halford Chessman.   8. DM2 - Follows with Dr. Doran Stabler. Had UTI with jardiance.   9. CAD - Non-obstructive. LHC 08/2015: LM 40, LAD distal 40, LCx ostial 50, RCA proximal 40 - On ASA, lopressor 12.5 mg daily - Discussed statin. He agrees to start atorvastatin 20 mg daily for secondary prevention. Check CMET today.  57. Deconditioning - [facility] recently finished and he has referral for [facility], but they have not scheduled yet. They decline another referral today. He has a Physiological scientist who comes 3x/week.   Follow up in 4-6 weeks.   Georgiana Shore, NP  9:33 AM  Greater than 50% of the 25 minute visit was spent in counseling/coordination of care regarding disease state education, salt/fluid restriction, sliding scale diuretics, and medication compliance.

## 2019-01-05 ENCOUNTER — Ambulatory Visit (HOSPITAL_COMMUNITY)
Admission: RE | Admit: 2019-01-05 | Discharge: 2019-01-05 | Disposition: A | Discharge status: Home / Self Care | Payer: PPO | Source: Ambulatory Visit | Attending: Internal Medicine | Admitting: Internal Medicine

## 2019-01-05 ENCOUNTER — Other Ambulatory Visit (HOSPITAL_COMMUNITY): Payer: Self-pay

## 2019-01-05 ENCOUNTER — Other Ambulatory Visit: Payer: Self-pay

## 2019-01-05 VITALS — BP 138/82 | HR 72 | Wt 270.9 lb

## 2019-01-05 DIAGNOSIS — J9611 Chronic respiratory failure with hypoxia: Secondary | ICD-10-CM | POA: Insufficient documentation

## 2019-01-05 DIAGNOSIS — Z7982 Long term (current) use of aspirin: Secondary | ICD-10-CM | POA: Diagnosis not present

## 2019-01-05 DIAGNOSIS — G4733 Obstructive sleep apnea (adult) (pediatric): Secondary | ICD-10-CM

## 2019-01-05 DIAGNOSIS — Z884 Allergy status to anesthetic agent status: Secondary | ICD-10-CM | POA: Diagnosis not present

## 2019-01-05 DIAGNOSIS — Z794 Long term (current) use of insulin: Secondary | ICD-10-CM | POA: Insufficient documentation

## 2019-01-05 DIAGNOSIS — G2581 Restless legs syndrome: Secondary | ICD-10-CM | POA: Diagnosis not present

## 2019-01-05 DIAGNOSIS — Z87891 Personal history of nicotine dependence: Secondary | ICD-10-CM | POA: Insufficient documentation

## 2019-01-05 DIAGNOSIS — E039 Hypothyroidism, unspecified: Secondary | ICD-10-CM | POA: Insufficient documentation

## 2019-01-05 DIAGNOSIS — I5032 Chronic diastolic (congestive) heart failure: Secondary | ICD-10-CM | POA: Diagnosis not present

## 2019-01-05 DIAGNOSIS — I2510 Atherosclerotic heart disease of native coronary artery without angina pectoris: Secondary | ICD-10-CM | POA: Insufficient documentation

## 2019-01-05 DIAGNOSIS — J449 Chronic obstructive pulmonary disease, unspecified: Secondary | ICD-10-CM | POA: Insufficient documentation

## 2019-01-05 DIAGNOSIS — Z888 Allergy status to other drugs, medicaments and biological substances status: Secondary | ICD-10-CM | POA: Diagnosis not present

## 2019-01-05 DIAGNOSIS — Z9981 Dependence on supplemental oxygen: Secondary | ICD-10-CM | POA: Diagnosis not present

## 2019-01-05 DIAGNOSIS — N183 Chronic kidney disease, stage 3 unspecified: Secondary | ICD-10-CM

## 2019-01-05 DIAGNOSIS — Z79899 Other long term (current) drug therapy: Secondary | ICD-10-CM | POA: Diagnosis not present

## 2019-01-05 DIAGNOSIS — R5381 Other malaise: Secondary | ICD-10-CM

## 2019-01-05 DIAGNOSIS — I4510 Unspecified right bundle-branch block: Secondary | ICD-10-CM | POA: Insufficient documentation

## 2019-01-05 DIAGNOSIS — R001 Bradycardia, unspecified: Secondary | ICD-10-CM | POA: Insufficient documentation

## 2019-01-05 DIAGNOSIS — Z881 Allergy status to other antibiotic agents status: Secondary | ICD-10-CM | POA: Diagnosis not present

## 2019-01-05 DIAGNOSIS — I130 Hypertensive heart and chronic kidney disease with heart failure and stage 1 through stage 4 chronic kidney disease, or unspecified chronic kidney disease: Secondary | ICD-10-CM | POA: Diagnosis not present

## 2019-01-05 DIAGNOSIS — I10 Essential (primary) hypertension: Secondary | ICD-10-CM | POA: Diagnosis not present

## 2019-01-05 DIAGNOSIS — Z8249 Family history of ischemic heart disease and other diseases of the circulatory system: Secondary | ICD-10-CM | POA: Diagnosis not present

## 2019-01-05 DIAGNOSIS — E1122 Type 2 diabetes mellitus with diabetic chronic kidney disease: Secondary | ICD-10-CM | POA: Insufficient documentation

## 2019-01-05 DIAGNOSIS — Z79890 Hormone replacement therapy (postmenopausal): Secondary | ICD-10-CM | POA: Insufficient documentation

## 2019-01-05 DIAGNOSIS — Z882 Allergy status to sulfonamides status: Secondary | ICD-10-CM | POA: Diagnosis not present

## 2019-01-05 DIAGNOSIS — E1140 Type 2 diabetes mellitus with diabetic neuropathy, unspecified: Secondary | ICD-10-CM | POA: Diagnosis not present

## 2019-01-05 LAB — COMPREHENSIVE METABOLIC PANEL
ALT: 20 U/L (ref 0–44)
AST: 20 U/L (ref 15–41)
Albumin: 3.3 g/dL — ABNORMAL LOW (ref 3.5–5.0)
Alkaline Phosphatase: 88 U/L (ref 38–126)
Anion gap: 11 (ref 5–15)
BUN: 60 mg/dL — ABNORMAL HIGH (ref 8–23)
CO2: 29 mmol/L (ref 22–32)
Calcium: 9.5 mg/dL (ref 8.9–10.3)
Chloride: 94 mmol/L — ABNORMAL LOW (ref 98–111)
Creatinine, Ser: 1.93 mg/dL — ABNORMAL HIGH (ref 0.61–1.24)
GFR calc Af Amer: 39 mL/min — ABNORMAL LOW (ref >60)
GFR calc non Af Amer: 33 mL/min — ABNORMAL LOW (ref >60)
Glucose, Bld: 390 mg/dL — ABNORMAL HIGH (ref 70–99)
Potassium: 5 mmol/L (ref 3.5–5.1)
Sodium: 134 mmol/L — ABNORMAL LOW (ref 135–145)
Total Bilirubin: 0.8 mg/dL (ref 0.3–1.2)
Total Protein: 7.4 g/dL (ref 6.5–8.1)

## 2019-01-05 MED ORDER — ATORVASTATIN CALCIUM 20 MG PO TABS: 20.0000 mg | ORAL_TABLET | Freq: Every day | ORAL | 3 refills | Status: DC

## 2019-01-05 MED ORDER — TORSEMIDE 20 MG PO TABS: 60.0000 mg | ORAL_TABLET | Freq: Two times a day (BID) | ORAL | 11 refills | Status: DC

## 2019-01-05 NOTE — Patient Instructions (Addendum)
STARTAtorvastatin  20mg  (1 tab) daily  TAKE Torsemide 80mg  (4 tabs) twice a day for 2 days, THEN resume Torsemide 60mg  twice a day as you have been doing.   Please check oxygen level during day and at night.  If consistently below 90%, please contact our office to discuss.    Your physician recommends that you schedule a follow-up appointment in: 4-6 weeks with NP/PA clinic

## 2019-01-07 ENCOUNTER — Telehealth (HOSPITAL_COMMUNITY): Payer: Self-pay | Admitting: Cardiology

## 2019-01-07 NOTE — Telephone Encounter (Signed)
Notes recorded by Kerry Dory, CMA on 01/07/2019 at 12:39 PM EST Patient aware. Patient voiced understanding.   ------  Notes recorded by Vanice Sarah, CMA on 01/06/2019 at 12:48 PM EST Left VM for pt to call clinic. ------  Notes recorded by Valeda Malm, RN on 01/05/2019 at 2:42 PM EST LM at home number to return call to office regarding lab results ------  Notes recorded by Georgiana Shore, NP on 01/05/2019 at 1:29 PM EST Renal function relatively stable. Potassium on high end of normal, seems to run on high end. He can stop potassium supplement (on 20 meq daily). Blood sugar is still very high, but his PCP adjusted insulin yesterday.Thanks!

## 2019-01-07 NOTE — Telephone Encounter (Signed)
-----   Message from Georgiana Shore, NP sent at 01/05/2019  1:29 PM EST ----- Renal function relatively stable. Potassium on high end of normal, seems to run on high end. He can stop potassium supplement (on 20 meq daily). Blood sugar is still very high, but his PCP adjusted insulin yesterday.Thanks!

## 2019-01-11 DIAGNOSIS — N3941 Urge incontinence: Secondary | ICD-10-CM | POA: Diagnosis not present

## 2019-01-11 DIAGNOSIS — R350 Frequency of micturition: Secondary | ICD-10-CM | POA: Diagnosis not present

## 2019-01-18 DIAGNOSIS — R350 Frequency of micturition: Secondary | ICD-10-CM | POA: Diagnosis not present

## 2019-01-18 DIAGNOSIS — N3941 Urge incontinence: Secondary | ICD-10-CM | POA: Diagnosis not present

## 2019-01-18 DIAGNOSIS — R300 Dysuria: Secondary | ICD-10-CM | POA: Diagnosis not present

## 2019-01-19 DIAGNOSIS — M4800 Spinal stenosis, site unspecified: Secondary | ICD-10-CM | POA: Diagnosis not present

## 2019-01-19 DIAGNOSIS — G9340 Encephalopathy, unspecified: Secondary | ICD-10-CM | POA: Diagnosis not present

## 2019-01-19 DIAGNOSIS — I5033 Acute on chronic diastolic (congestive) heart failure: Secondary | ICD-10-CM | POA: Diagnosis not present

## 2019-01-19 DIAGNOSIS — N390 Urinary tract infection, site not specified: Secondary | ICD-10-CM | POA: Diagnosis not present

## 2019-01-20 ENCOUNTER — Ambulatory Visit: Payer: PPO | Admitting: Internal Medicine

## 2019-01-21 ENCOUNTER — Telehealth: Payer: Self-pay | Admitting: Cardiovascular Disease

## 2019-01-21 IMAGING — DX DG CHEST 1V PORT
1 series · 1 of 1 positions shown · non-contrast
Comparison: 04/15/2018

CLINICAL DATA: Respiratory failure

EXAM:
PORTABLE CHEST 1 VIEW

[chest ap]
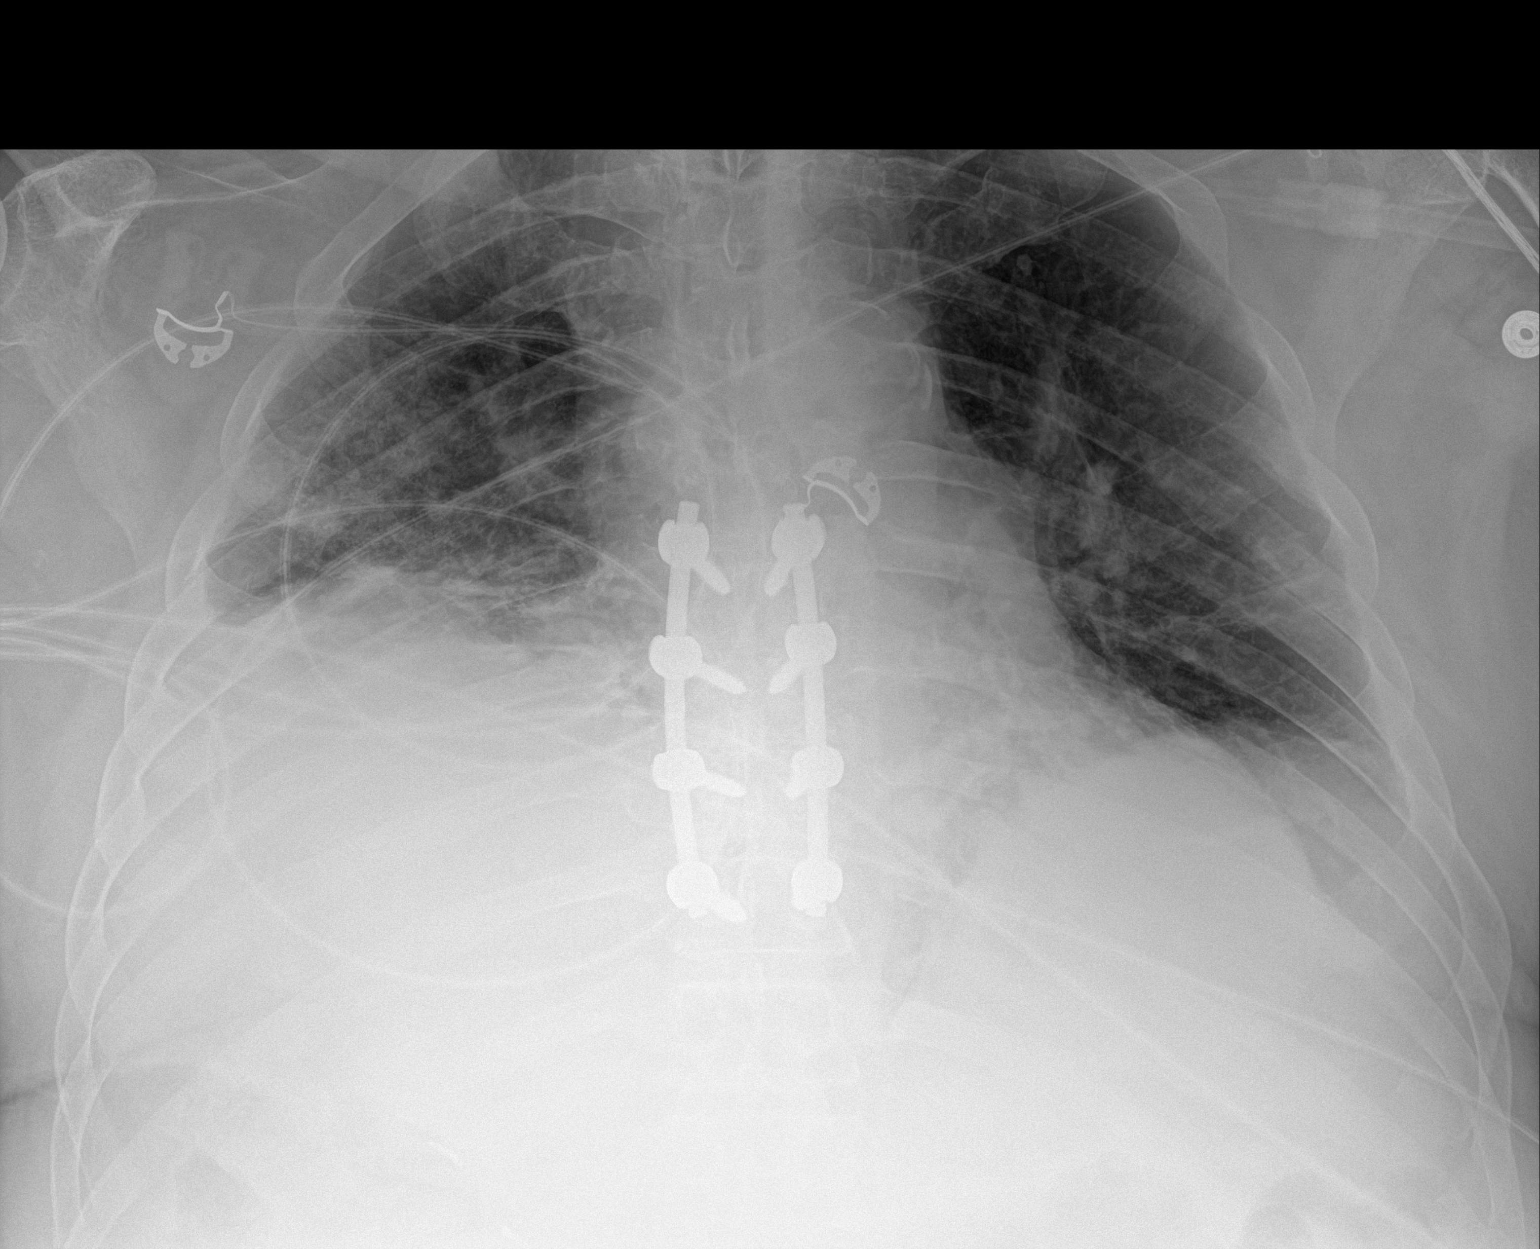

[1 of 1 positions shown; findings below may reference images not displayed]

FINDINGS: Hypoventilation with decreased lung volume and bibasilar airspace
disease right greater than left. Elevated right hemidiaphragm.
Progression of right lower lobe airspace disease with small right
effusion.
IMPRESSION: Hypoventilation with bibasilar atelectasis. Progression of right
lower lobe airspace disease which may be pneumonia or atelectasis.

## 2019-01-21 NOTE — Telephone Encounter (Signed)
Per Rebecca Eaton pt should take Torsemide 80mg  bid for 2 days and have repeat bmet next week. I called and spoke with pts wife she is aware and agreeable with plan. Pt will have labs drawn at his urologist Tues and they will fax results.

## 2019-01-21 NOTE — Telephone Encounter (Signed)
New message   Pt c/o swelling: STAT is pt has developed SOB within 24 hours  1) How much weight have you gained and in what time span? 7.5 lbs in 3 days  2) If swelling, where is the swelling located? Ankles and abdomen   3) Are you currently taking a fluid pill? Yes   4) Are you currently SOB? Yes, per [employer] states that the patient is always sob  Do you have a log of your daily weights (if so, list)? Yes, can provide if needed per Amber   5) Have you gained 3 pounds in a day or 5 pounds in a week? Yes   6) Have you traveled recently? No

## 2019-01-25 DIAGNOSIS — R350 Frequency of micturition: Secondary | ICD-10-CM | POA: Diagnosis not present

## 2019-01-25 DIAGNOSIS — N3941 Urge incontinence: Secondary | ICD-10-CM | POA: Diagnosis not present

## 2019-01-25 DIAGNOSIS — R3914 Feeling of incomplete bladder emptying: Secondary | ICD-10-CM | POA: Diagnosis not present

## 2019-02-01 DIAGNOSIS — R350 Frequency of micturition: Secondary | ICD-10-CM | POA: Diagnosis not present

## 2019-02-01 DIAGNOSIS — N3941 Urge incontinence: Secondary | ICD-10-CM | POA: Diagnosis not present

## 2019-02-02 NOTE — Progress Notes (Signed)
ADVANCED HF CLINIC NOTE  Referring Physician: Richardson Dopp PA Primary Care: Dr Doran Stabler  Primary Cardiologist: Dr Acie Fredrickson  Pulmonary: Dr Halford Chessman HF MD: Dr [name]  HPI: Travis [name] is a 73 y.o. 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   Admitted 04/08/2018 for decompression lumber laminectomy. Hospital course was complicated by respiratory failure and severe deconditioning. He was discharged to [facility] and went home on 05/12/2018.   Admitted 7/1 through 07/04/18. Admitted with AMS due to CO2 retention/hypercarbic respiratory failure due to CO2 retention and A/C diastolic heart failure. BB decreased in the hospital due to bradycardic. Discharge weight 275 pounds.  Admitted 11/23 - 17/51 with complicated UTI (UCx with Pseudomonas). AKI noted on labs. Treated with IV ABX.   Today he returns for HF follow up. Last visit he was volume overloaded so torsemide was increased to 80 mg twice a day for 2 days then back to 60 mg twice a day. Overall feeling fair. SOB with minimal exertion. Having increased leg edema. Denies PND/Orthopnea. No chest pain. Appetite ok. Tries to follow low salt diet but has bacon everyday. No fever or chills. Weight at home trending up from 274-->277 pounds. Taking all medications. Followed by [facility] for HHPT/Nursing.    Cardiac Testing  PYP scan 11/26/18 with Grade 1, H/CLL 1.45. Equivocal. Personally reviewed by Dr. [name]. Negative.  Echocardiogram 06/29/2018 EF 55-60, normal wall motion, grade 1 diastolic dysfunction Echocardiogram 03/06/17 Mild LVH, EF 65-70, normal wall motion, grade 2 diastolic dysfunction, mild LAE Echo 08/21/15 EF 55-60, normal wall motion, grade 2 diastolic dysfunction, mild LAE  R/L cardiac catheterization 09/07/15 LM 40 LAD distal 40 LCx ostial 50 RCA proximal 40 EF 55-60 LVEDP 16, mean RA 4, PASP 45, mean PCWP 13  Nuclear stress test  03/19/15 EF 48, no ischemia, low risk  Review of systems complete and found to be negative unless listed in HPI.   Past Medical History:  Diagnosis Date  . Cervical myelopathy (HCC)    with myelomalacia at C5-6 and C3-4  . CHF (congestive heart failure) ([employer])   . Constipation - functional    controlled with miralax  . COPD (chronic obstructive pulmonary disease) (HCC)    mild  . DJD (degenerative joint disease)    WHOLE SPINE  . Dyspnea   . Gait disorder   . Heart murmur    years ago   . Hypertension   . Hypothyroidism    Low d/t treatment of hyperthyroidism  . Lumbosacral spinal stenosis 03/13/2014  . On home oxygen therapy    "2.5L all the time" (06/29/2018)  . Peripheral neuropathy   . Pneumonia   . PONV (postoperative nausea and vomiting)   . RBBB   . Renal disorder    acute kidney failure  . Restless legs syndrome 03/14/2015  . RLS (restless legs syndrome)   . Sleep apnea    DX  2009/2010 study done at [facility]  . 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 Type II diabetes mellitus (HCC)    diet controlled    Current Outpatient Medications  Medication Sig Dispense Refill  . aspirin EC 81 MG EC tablet Take 1 tablet (81 mg total) by mouth daily. 30 tablet 0  . atorvastatin (LIPITOR) 20 MG tablet Take 1 tablet (20 mg total) by mouth daily. 90 tablet 3  . docusate sodium (COLACE) 100 MG capsule Take 200 mg by  mouth at bedtime.    [name] escitalopram (LEXAPRO) 20 MG tablet Take 20 mg by mouth daily.    [name] gabapentin (NEURONTIN) 400 MG capsule TAKE 1 CAPSULE (400 MG TOTAL) BY MOUTH EVERY 8 (EIGHT) HOURS. (Patient taking differently: Take 400 mg by mouth 3 (three) times daily. ) 90 capsule 0  . hydrALAZINE (APRESOLINE) 50 MG tablet Take 1.5 tablets (75 mg total) by mouth 3 (three) times daily. 378 tablet 3  . insulin aspart (NOVOLOG FLEXPEN) 100 UNIT/ML FlexPen Inject 4 Units into the skin daily with breakfast AND 6 Units 2 (two) times daily before lunch and supper. 15 mL 11  .  insulin glargine (LANTUS) 100 UNIT/ML injection Inject 0.4 mLs (40 Units total) into the skin daily. 15 mL 6  . Insulin Pen Needle (BD PEN NEEDLE MICRO U/F) 32G X 6 MM MISC Four times daily 150 each 6  . ketoconazole (NIZORAL) 2 % cream Apply 1 application topically daily. 120 g 0  . levothyroxine (SYNTHROID, LEVOTHROID) 175 MCG tablet Take 1 tablet (175 mcg total) by mouth daily. 30 tablet 2  . metoprolol tartrate (LOPRESSOR) 25 MG tablet Take 0.5 tablets (12.5 mg total) by mouth daily. 45 tablet 3  . multivitamin (ONE-A-DAY MEN'S) TABS tablet Take 1 tablet by mouth at bedtime.    . polyethylene glycol (MIRALAX / GLYCOLAX) packet Take 17 g by mouth daily as needed. For constipation (Patient taking differently: Take 17 g by mouth at bedtime. ) 14 each 0  . pramipexole (MIRAPEX) 0.5 MG tablet One tablet in the morning and 2 in the evening 270 tablet 1  . spironolactone (ALDACTONE) 25 MG tablet Take 1 tablet (25 mg total) by mouth daily. 90 tablet 3  . tamsulosin (FLOMAX) 0.4 MG CAPS capsule Take 1 capsule (0.4 mg total) by mouth daily. (Patient taking differently: Take 0.4 mg by mouth at bedtime. ) 30 capsule 0  . torsemide (DEMADEX) 20 MG tablet Take 3 tablets (60 mg total) by mouth 2 (two) times daily. 180 tablet 11  . TOVIAZ 8 MG TB24 tablet Take 1 tablet (8 mg total) by mouth daily. 30 tablet 11   No current facility-administered medications for this encounter.     Allergies  Allergen Reactions  . Amlodipine Swelling and Other (See Comments)    Causes edema (per [facility] paperwork)  . Plendil [Felodipine] Swelling and Other (See Comments)    Causes edema (per [facility] paperwork)  . Compazine [Prochlorperazine] Other (See Comments)    Caused agitation (per [facility] paperwork)  . Cymbalta [Duloxetine Hcl] Other (See Comments)    Made the patient "feel weird," per [facility] paperwork  . Jardiance [Empagliflozin] Other (See Comments)    Stopped by MD because  of unwanted side effects  . Other Nausea Only and Other (See Comments)    ANESTHESIA UNSPECIFIED- makes sick, must have anti-nausea medication in advance  . Levofloxacin In D5w Itching    Possible infusion reaction 03/29/17. Tolerating oral Levaquin 03/30/17  . Septra [Sulfamethoxazole-Trimethoprim] Nausea Only and Other (See Comments)    Per [facility] paperwork      Social History   Socioeconomic History  . Marital status: Married    Spouse name: carolyn  . Number of children: 3  . Years of education: 47  . Highest education level: Not on file  Occupational History    Employer: [employer]: Works in [place]  . Financial resource strain: Not on file  .  Food insecurity:    Worry: Not on file    Inability: Not on file  . Transportation needs:    Medical: Not on file    Non-medical: Not on file  Tobacco Use  . Smoking status: Former Smoker    Packs/day: 2.00    Years: 20.00    Pack years: 40.00    Types: Cigarettes    Last attempt to quit: 06/28/1985    Years since quitting: 33.6  . Smokeless tobacco: Former Systems developer    Quit date: 06/28/1985  Substance and Sexual Activity  . Alcohol use: No  . Drug use: No  . Sexual activity: Not on file  Lifestyle  . Physical activity:    Days per week: Not on file    Minutes per session: Not on file  . Stress: Not on file  Relationships  . Social connections:    Talks on phone: Not on file    Gets together: Not on file    Attends religious service: Not on file    Active member of club or organization: Not on file    Attends meetings of clubs or organizations: Not on file    Relationship status: Not on file  . Intimate partner violence:    Fear of current or ex partner: Not on file    Emotionally abused: Not on file    Physically abused: Not on file    Forced sexual activity: Not on file  Other Topics Concern  . Not on file  Social History Narrative   Lives with wife,  [name]     Patient is married with 3 children.   Patient has 16 yrs of education.   Patient is right handed.   Patient drinks 2 cups of caffeine per day.      Family History  Problem Relation Age of Onset  . Polycythemia Mother   . Diabetes Father   . Hypertension Father   . Heart disease Father   . CAD Unknown        1 of his 4 brothers and father has CAD    Vitals:   02/03/19 0851  BP: (!) 158/90  Pulse: 65  SpO2: 90%  Weight: 126.3 kg (278 lb 6.4 oz)   Wt Readings from Last 3 Encounters:  02/03/19 126.3 kg (278 lb 6.4 oz)  01/05/19 122.9 kg (270 lb 14.4 oz)  12/31/18 122.6 kg (270 lb 3.2 oz)    PHYSICAL EXAM: General: Arrived in wheel chair. No resp difficulty HEENT: normal Neck: supple. JVP to jaw. Carotids 2+ bilat; no bruits. No lymphadenopathy or thryomegaly appreciated. Cor: PMI nondisplaced. Regular rate & rhythm. No rubs, gallops or murmurs. Lungs: clear Abdomen: soft, nontender, nondistended. No hepatosplenomegaly. No bruits or masses. Good bowel sounds. Extremities: no cyanosis, clubbing, rash, R and LLE 1-2+ edema Neuro: alert & orientedx3, cranial nerves grossly intact. moves all 4 extremities w/o difficulty. Affect pleasant    ASSESSMENT & PLAN:  1. Chronic Diastolic Heart Failure with R>>L symptoms - ECHO 06/29/2018 EF 55-60% Grade IDD  - Suspect due to R-sided HF from OSA, OHS and COPD - Myeloma panel with no M-spike, normal IFE.  NYHA IIIb. Volume status elevated. Increase torsemide to 80 mg twice a day and add 2.5 mg metolazone every Friday.  - Continue spiro 25 daily - Intolerant to [place] with UTI and rash. 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 Reviewed by Dr. [name] and determined to be negative.  -Check BMET next week by [facility]  next week.   2.Chronic Hypoxic Respiratory Failure/COPD - Continue 3L O2 qHS. - Sats stable.   3. CKD Stage III - Creatinine baseline 1.91  - I reviewed BMEt from 1/8. Will ask [facility] to check BMET next week and  fax results.    4. HTN - Elevated but hopefully will improve with diuresis.   5. Hypothyroidism  - Per PCP.   6. Bradycardia Stable.   7. OSA - Encouraged nightly BiPAP. Recent adjustments.  - Follows with Dr. Halford Chessman.   8. DM2 - Follows with Dr. Doran Stabler. Had UTI with jardiance.   9. CAD - Non-obstructive. LHC 08/2015: LM 40, LAD distal 40, LCx ostial 50, RCA proximal 40 - No s/s ischemia  -On ASA, lopressor 12.5 mg daily - Continue statin.   10. Deconditioning Continue [facility] PT.   Follow up in 3 weeks. Add diuretic protocol with [facility]. Discussed low salt food choices and limiting fluid intake to < 2 liters per day.    Darrick Grinder, NP  9:09 AM

## 2019-02-03 ENCOUNTER — Encounter (HOSPITAL_COMMUNITY): Payer: Self-pay

## 2019-02-03 ENCOUNTER — Other Ambulatory Visit (HOSPITAL_COMMUNITY): Payer: Self-pay

## 2019-02-03 ENCOUNTER — Ambulatory Visit (HOSPITAL_COMMUNITY)
Admission: RE | Admit: 2019-02-03 | Discharge: 2019-02-03 | Disposition: A | Discharge status: Home / Self Care | Payer: PPO | Source: Ambulatory Visit | Attending: Internal Medicine | Admitting: Internal Medicine

## 2019-02-03 VITALS — BP 158/90 | HR 65 | Wt 278.4 lb

## 2019-02-03 DIAGNOSIS — Z882 Allergy status to sulfonamides status: Secondary | ICD-10-CM | POA: Insufficient documentation

## 2019-02-03 DIAGNOSIS — Z884 Allergy status to anesthetic agent status: Secondary | ICD-10-CM | POA: Insufficient documentation

## 2019-02-03 DIAGNOSIS — Z7982 Long term (current) use of aspirin: Secondary | ICD-10-CM | POA: Insufficient documentation

## 2019-02-03 DIAGNOSIS — I130 Hypertensive heart and chronic kidney disease with heart failure and stage 1 through stage 4 chronic kidney disease, or unspecified chronic kidney disease: Secondary | ICD-10-CM | POA: Insufficient documentation

## 2019-02-03 DIAGNOSIS — Z881 Allergy status to other antibiotic agents status: Secondary | ICD-10-CM | POA: Insufficient documentation

## 2019-02-03 DIAGNOSIS — Z9981 Dependence on supplemental oxygen: Secondary | ICD-10-CM | POA: Insufficient documentation

## 2019-02-03 DIAGNOSIS — Z888 Allergy status to other drugs, medicaments and biological substances status: Secondary | ICD-10-CM | POA: Insufficient documentation

## 2019-02-03 DIAGNOSIS — R5381 Other malaise: Secondary | ICD-10-CM | POA: Diagnosis not present

## 2019-02-03 DIAGNOSIS — E1122 Type 2 diabetes mellitus with diabetic chronic kidney disease: Secondary | ICD-10-CM | POA: Insufficient documentation

## 2019-02-03 DIAGNOSIS — I5032 Chronic diastolic (congestive) heart failure: Secondary | ICD-10-CM

## 2019-02-03 DIAGNOSIS — J9611 Chronic respiratory failure with hypoxia: Secondary | ICD-10-CM | POA: Insufficient documentation

## 2019-02-03 DIAGNOSIS — E1140 Type 2 diabetes mellitus with diabetic neuropathy, unspecified: Secondary | ICD-10-CM | POA: Diagnosis not present

## 2019-02-03 DIAGNOSIS — Z8249 Family history of ischemic heart disease and other diseases of the circulatory system: Secondary | ICD-10-CM | POA: Diagnosis not present

## 2019-02-03 DIAGNOSIS — R001 Bradycardia, unspecified: Secondary | ICD-10-CM | POA: Diagnosis not present

## 2019-02-03 DIAGNOSIS — Z833 Family history of diabetes mellitus: Secondary | ICD-10-CM | POA: Insufficient documentation

## 2019-02-03 DIAGNOSIS — E039 Hypothyroidism, unspecified: Secondary | ICD-10-CM | POA: Insufficient documentation

## 2019-02-03 DIAGNOSIS — I4510 Unspecified right bundle-branch block: Secondary | ICD-10-CM | POA: Diagnosis not present

## 2019-02-03 DIAGNOSIS — I2510 Atherosclerotic heart disease of native coronary artery without angina pectoris: Secondary | ICD-10-CM | POA: Insufficient documentation

## 2019-02-03 DIAGNOSIS — Z79899 Other long term (current) drug therapy: Secondary | ICD-10-CM | POA: Diagnosis not present

## 2019-02-03 DIAGNOSIS — N183 Chronic kidney disease, stage 3 unspecified: Secondary | ICD-10-CM

## 2019-02-03 DIAGNOSIS — G2581 Restless legs syndrome: Secondary | ICD-10-CM | POA: Diagnosis not present

## 2019-02-03 DIAGNOSIS — J449 Chronic obstructive pulmonary disease, unspecified: Secondary | ICD-10-CM | POA: Diagnosis not present

## 2019-02-03 DIAGNOSIS — Z87891 Personal history of nicotine dependence: Secondary | ICD-10-CM | POA: Insufficient documentation

## 2019-02-03 DIAGNOSIS — G4733 Obstructive sleep apnea (adult) (pediatric): Secondary | ICD-10-CM | POA: Insufficient documentation

## 2019-02-03 DIAGNOSIS — Z794 Long term (current) use of insulin: Secondary | ICD-10-CM | POA: Diagnosis not present

## 2019-02-03 MED ORDER — METOLAZONE 2.5 MG PO TABS: 2.5000 mg | ORAL_TABLET | ORAL | 6 refills | Status: DC

## 2019-02-03 MED ORDER — TORSEMIDE 20 MG PO TABS: 80.0000 mg | ORAL_TABLET | Freq: Two times a day (BID) | ORAL | 11 refills | Status: DC

## 2019-02-03 NOTE — Progress Notes (Signed)
Script for 1 week labs and diuretic protocol fax[facility] to [facility].

## 2019-02-03 NOTE — Patient Instructions (Signed)
INCREASE Torsemide to 80mg  (4 tabs) twice a day  TAKE Metolazone 2.5mg  Weekly on Fridays, starting tomorrow.   [facility] to draw labs in 1 week.  We will only contact you if something comes back abnormal or we need to make some changes. Otherwise no news is good news!  Your physician recommends that you schedule a follow-up appointment in: 3 weeks with NP/PA

## 2019-02-04 ENCOUNTER — Encounter: Payer: Self-pay | Admitting: Internal Medicine

## 2019-02-04 ENCOUNTER — Ambulatory Visit (INDEPENDENT_AMBULATORY_CARE_PROVIDER_SITE_OTHER): Payer: PPO | Admitting: Internal Medicine

## 2019-02-04 VITALS — BP 128/62 | HR 65 | Ht 73.0 in | Wt 277.0 lb

## 2019-02-04 DIAGNOSIS — E1165 Type 2 diabetes mellitus with hyperglycemia: Secondary | ICD-10-CM | POA: Diagnosis not present

## 2019-02-04 DIAGNOSIS — E1142 Type 2 diabetes mellitus with diabetic polyneuropathy: Secondary | ICD-10-CM

## 2019-02-04 LAB — POCT GLYCOSYLATED HEMOGLOBIN (HGB A1C): Hemoglobin A1C: 13.3 % — AB (ref 4.0–5.6)

## 2019-02-04 MED ORDER — INSULIN GLARGINE 100 UNIT/ML SOLOSTAR PEN: 48.0000 [IU] | PEN_INJECTOR | Freq: Every day | SUBCUTANEOUS | 11 refills | Status: DC

## 2019-02-04 MED ORDER — FREESTYLE LIBRE 14 DAY SENSOR MISC: 1.0000 | Freq: Three times a day (TID) | 11 refills | Status: DC

## 2019-02-04 MED ORDER — INSULIN PEN NEEDLE 32G X 6 MM MISC: 6 refills | Status: DC

## 2019-02-04 MED ORDER — INSULIN PEN NEEDLE 32G X 6 MM MISC: 11 refills | Status: DC

## 2019-02-04 MED ORDER — FREESTYLE LIBRE 14 DAY READER DEVI: 1.0000 | Freq: Three times a day (TID) | 0 refills | Status: DC

## 2019-02-04 MED ORDER — INSULIN ASPART 100 UNIT/ML FLEXPEN: 14.0000 [IU] | PEN_INJECTOR | Freq: Three times a day (TID) | SUBCUTANEOUS | 11 refills | Status: DC

## 2019-02-04 NOTE — Patient Instructions (Addendum)
-   Increase Lantus to 48 units  Daily  - Increase Novolog to 14 units daily  - Check sugar 4 times day (befre meals and bedtime)  - Choose healthy, lower carb lower calorie snacks: toss salad, cooked vegetables, cottage cheese, peanut butter, low fat cheese / string cheese, lower sodium deli meat, tuna salad or chicken salad  - HOW TO TREAT LOW BLOOD SUGARS (Blood sugar LESS THAN 70 MG/DL)  Please follow the RULE OF 15 for the treatment of hypoglycemia treatment (when your (blood sugars are less than 70 mg/dL)    STEP 1: Take 15 grams of carbohydrates when your blood sugar is low, which includes:   3-4 GLUCOSE TABS  OR  3-4 OZ OF JUICE OR REGULAR SODA OR  ONE TUBE OF GLUCOSE GEL     STEP 2: RECHECK blood sugar in 15 MINUTES STEP 3: If your blood sugar is still low at the 15 minute recheck --> then, go back to STEP 1 and treat AGAIN with another 15 grams of carbohydrates.

## 2019-02-04 NOTE — Progress Notes (Signed)
Name: Travis [name]  Age/ Sex: 73 y.o., male   MRN/ DOB: [id number], Oct 24, 1946     PCP: [name], MD   Reason for Endocrinology Evaluation: Type 2 Diabetes Mellitus  Initial Endocrine Consultative Visit:     PATIENT IDENTIFIER: Travis [name] is a 73 y.o. male with a past medical history of OSA, RLS, M0NO, diastolic CHF and postablative hypothyroidism.  The patient has followed with Endocrinology clinic since 12/31/2018 for consultative assistance with management of his diabetes.  DIABETIC HISTORY:  Travis [name] was diagnosed with T2DM in 2017, he has been on ozempic and glipizide with persistent hyperglycemia,Jardiance caused recurrent genital infections,  insulin was started in December, 2019. His hemoglobin A1c has ranged from 6.7% in 2016, peaking at 11.3%  In 11/21/18   SUBJECTIVE:   During the last visit (12/31/2018): His A1c was 11.2%, he was on Lantus 60 units daily, no prandial insulin at the time.  We decrease Lantus to 40 units daily and started NovoLog 4 units with breakfast and 6 units with lunch and supper  Today (02/06/2019): Travis [name] is here for one-month follow-up on diabetes management. He checks his blood sugars 4 times daily, preprandial and at bedtime. The patient has not had hypoglycemic episodes since the last clinic visit.  Otherwise, the patient has not required any recent emergency interventions for hypoglycemia and has not had recent hospitalizations secondary to hyper or hypoglycemic episodes.   Patient has gained Approximately 8 pounds since his last visit in January, he was seen by cardiology yesterday and his diuretic regimen has been adjusted.   ROS: As per HPI and as detailed below: Review of Systems  Constitutional: Negative for fever and weight loss.  HENT: Negative for congestion and sore throat.   Respiratory: Negative for cough and shortness of breath.     Cardiovascular: Positive for leg swelling. Negative for chest pain.  Genitourinary: Positive for frequency.      HOME DIABETES REGIMEN:  Lantus 40 units daily  Novolog 4 units with breakfast, 6 units with lunch and supper    GLUCOSE LOG : Date  Breakfast  Lunch  Supper Bedtime   01/23/19 [phone]  1/27 [phone]  1/28 [phone] 453  1/29 440 [phone]  1/30 373 [phone]  1/31 [phone] 400  2/1 424 [phone]     HISTORY:  Past Medical History:  Past Medical History:  Diagnosis Date  . Cervical myelopathy (HCC)    with myelomalacia at C5-6 and C3-4  . CHF (congestive heart failure) ([place])   . Constipation - functional    controlled with miralax  . COPD (chronic obstructive pulmonary disease) (HCC)    mild  . DJD (degenerative joint disease)    WHOLE SPINE  . Dyspnea   . Gait disorder   . Heart murmur    years ago   . Hypertension   . Hypothyroidism    Low d/t treatment of hyperthyroidism  . Lumbosacral spinal stenosis 03/13/2014  . On home oxygen therapy    "2.5L all the time" (06/29/2018)  . Peripheral neuropathy   . Pneumonia   . PONV (postoperative nausea and vomiting)   . RBBB   . Renal disorder    acute kidney failure  . Restless legs syndrome 03/14/2015  . RLS (restless legs syndrome)   . Sleep apnea    DX  2009/2010 [facility]  . Thoracic myelopathy  T1-2 and T4-5  . Type II diabetes mellitus ([place])    diet controlled   Past Surgical History:  Past Surgical History:  Procedure Laterality Date  . BACK SURGERY     6 surgeries  . CARDIAC CATHETERIZATION [name] 09/07/2015   Procedure: Right/Left Heart Cath and Coronary Angiography;  Surgeon: Peter M Martinique, MD;  Location: [facility];  Service: Cardiovascular;  Laterality: [name];  . CHOLECYSTECTOMY    . EYE SURGERY     BIL CATARACTS  . LUMBAR LAMINECTOMY/DECOMPRESSION MICRODISCECTOMY  07/14/2012   Procedure: LUMBAR LAMINECTOMY/DECOMPRESSION MICRODISCECTOMY 2 LEVELS;   Surgeon: Eustace Moore, MD;  Location: [facility];  Service: Neurosurgery;  Laterality: Left;  Lumbar two three laminectomy, left thoracic four five transpedicular diskectomy  . POSTERIOR CERVICAL FUSION/FORAMINOTOMY  04/08/2018   Procedure: Laminectomy and Foraminotomy - Lumbar Four-Lumbar Five, instrumented posterolateral fusion Lumbar Four- Five, Laminectomy and Foraminotomy - Thoracic One-Thoracic Two;  Surgeon: Eustace Moore, MD;  Location: [facility] OR;  Service: Neurosurgery;;  posterior  . POSTERIOR LUMBAR FUSION  04/08/2018   Laminectomy and Foraminotomy - Lumbar Four-Lumbar Five, instrumented posterolateral fusion Lumbar Four- Five, Laminectomy and Foraminotomy - Thoracic One-Thoracic Two  . POSTERIOR LUMBAR FUSION  04/08/2018   Laminectomy and Foraminotomy - Lumbar Four-Lumbar Five, instrumented posterolateral fusion Lumbar Four- Five, Laminectomy and Foraminotomy - Thoracic One-Thoracic Two    Social History:  reports that he quit smoking about 33 years ago. His smoking use included cigarettes. He has a 40.00 pack-year smoking history. He quit smokeless tobacco use about 33 years ago. He reports that he does not drink alcohol or use drugs. Family History:  Family History  Problem Relation Age of Onset  . Polycythemia Mother   . Diabetes Father   . Hypertension Father   . Heart disease Father   . CAD Unknown        1 of his 4 brothers and father has CAD     HOME MEDICATIONS: Allergies as of 02/04/2019      Reactions   Amlodipine Swelling, Other (See Comments)   Causes edema (per [facility] paperwork)   Plendil [felodipine] Swelling, Other (See Comments)   Causes edema (per [facility] paperwork)   Compazine [prochlorperazine] Other (See Comments)   Caused agitation (per [facility] paperwork)   Cymbalta [duloxetine Hcl] Other (See Comments)   Made the patient "feel weird," per [facility] paperwork   Jardiance [empagliflozin] Other (See Comments)   Stopped  by MD because of unwanted side effects   Other Nausea Only, Other (See Comments)   ANESTHESIA UNSPECIFIED- makes sick, must have anti-nausea medication in advance   Levofloxacin In D5w Itching   Possible infusion reaction 03/29/17. Tolerating oral Levaquin 03/30/17   Septra [sulfamethoxazole-trimethoprim] Nausea Only, Other (See Comments)   Per [facility] paperwork      Medication List       Accurate as of February 04, 2019 11:59 PM. Always use your most recent med list.        aspirin 81 MG EC tablet Take 1 tablet (81 mg total) by mouth daily.   atorvastatin 20 MG tablet Commonly known as:  LIPITOR Take 1 tablet (20 mg total) by mouth daily.   docusate sodium 100 MG capsule Commonly known as:  COLACE Take 200 mg by mouth at bedtime.   escitalopram 20 MG tablet Commonly known as:  LEXAPRO Take 20 mg by mouth daily.   FREESTYLE LIBRE 14 DAY READER Devi 1 kit by Does not  apply route 3 (three) times daily.   FREESTYLE LIBRE 14 DAY SENSOR Misc 1 kit by Does not apply route 3 (three) times daily.   gabapentin 400 MG capsule Commonly known as:  NEURONTIN TAKE 1 CAPSULE (400 MG TOTAL) BY MOUTH EVERY 8 (EIGHT) HOURS.   hydrALAZINE 50 MG tablet Commonly known as:  APRESOLINE Take 1.5 tablets (75 mg total) by mouth 3 (three) times daily.   insulin aspart 100 UNIT/ML FlexPen Commonly known as:  NOVOLOG FLEXPEN Inject 14 Units into the skin 3 (three) times daily with meals.   Insulin Glargine 100 UNIT/ML Solostar Pen Commonly known as:  LANTUS SOLOSTAR Inject 48 Units into the skin at bedtime.   Insulin Pen Needle 32G X 6 MM Misc Commonly known as:  BD PEN NEEDLE MICRO U/F 4 times a day   Insulin Pen Needle 32G X 6 MM Misc Commonly known as:  BD PEN NEEDLE MICRO U/F Four times daily   ketoconazole 2 % cream Commonly known as:  NIZORAL Apply 1 application topically daily.   levothyroxine 175 MCG tablet Commonly known as:  SYNTHROID, LEVOTHROID Take 1 tablet (175  mcg total) by mouth daily.   metolazone 2.5 MG tablet Commonly known as:  ZAROXOLYN Take 1 tablet (2.5 mg total) by mouth once a week. Fridays   metoprolol tartrate 25 MG tablet Commonly known as:  LOPRESSOR Take 0.5 tablets (12.5 mg total) by mouth daily.   multivitamin Tabs tablet Take 1 tablet by mouth at bedtime.   polyethylene glycol packet Commonly known as:  MIRALAX / GLYCOLAX Take 17 g by mouth daily as needed. For constipation   pramipexole 0.5 MG tablet Commonly known as:  MIRAPEX One tablet in the morning and 2 in the evening   spironolactone 25 MG tablet Commonly known as:  ALDACTONE Take 1 tablet (25 mg total) by mouth daily.   tamsulosin 0.4 MG Caps capsule Commonly known as:  FLOMAX Take 1 capsule (0.4 mg total) by mouth daily.   torsemide 20 MG tablet Commonly known as:  DEMADEX Take 4 tablets (80 mg total) by mouth 2 (two) times daily.   TOVIAZ 8 MG Tb24 tablet Generic drug:  fesoterodine Take 1 tablet (8 mg total) by mouth daily.        OBJECTIVE:   Vital Signs: BP 128/62 (BP Location: Right Arm, Patient Position: Sitting, Cuff Size: Normal)   Pulse 65   Ht 6' 1"  (1.854 m)   Wt 277 lb (125.6 kg)   SpO2 91%   BMI 36.55 kg/m   Wt Readings from Last 3 Encounters:  02/04/19 277 lb (125.6 kg)  02/03/19 278 lb 6.4 oz (126.3 kg)  01/05/19 270 lb 14.4 oz (122.9 kg)     Exam: General: Pt appears well and is in NAD  Lungs: Clear with good BS bilat with no rales, rhonchi, or wheezes  Heart: RRR with normal S1 and S2 and no gallops; no murmurs; no rub  Extremities: 2+ pretibial edema.   Skin: Normal texture and temperature to palpation. No rash noted. No Acanthosis nigricans/skin tags.  Neuro: MS is good with appropriate affect, pt is alert and Ox3    DM foot exam: 12/31/18 The skin of the feet is intact without sores or ulcerations. The toe nails are thickened and discolored.  The pedal pulses are 2+ on right and 2+ on left. The sensation is  intact to a screening 5.07, 10 gram monofilament bilaterally  DATA REVIEWED:  Lab Results  Component Value Date   HGBA1C 13.3 (A) 02/04/2019   HGBA1C 11.2 (H) 11/22/2018   HGBA1C 11.3 (H) 11/21/2018      ASSESSMENT / PLAN / RECOMMENDATIONS:   1) Type 2 Diabetes Mellitus, poorly controlled, With neuropathic and renal (CKD III) complications - Most recent A1c of 11.2 %. Goal A1c < 7.0 %.     -In review of his glucose log, he has been having persistent hyperglycemia with BG's over 300 mg/dL -His poorly controlled diabetes is due to suboptimal management, patient has been avoiding sugar sweetened beverages, patient has been also avoiding snacks or eating between meals.  -He was encouraged to call our office should his BG is continue to be in the 200 mg/dL range or higher or if his glucose is persistently below 70 mg/dL -Patient advised to call within 1 to 2 weeks with his glucose readings to make adjustments as necessary prior to his next visit -He is intolerant to SG L T2 inhibitors, he has tried Bydureon in the past with persistent hypoglycemia, this is something I will tease in the future once his glucose under control on an MDI regimen, is not a candidate for metformin due to low GFR -He was given a pamphlet for freestyle libre, we will try and obtain a prescription for him.  MEDICATIONS:  Increase Lantus to 48 units daily  Increase NovoLog to 14 units with each meal  EDUCATION / INSTRUCTIONS:  BG monitoring instructions: Patient is instructed to check his blood sugars 4 times a day, before meals and at bedtime.  Call [facility] if: BG persistently < 70 or > 300. . I reviewed the Rule of 15 for the treatment of hypoglycemia in detail with the patient. Literature supplied.       F/U in 6 weeks  Signed electronically by: Mack Guise, MD  [facility]  [facility] [address] [place], [place] [address] Phone: [phone] FAX: [phone]   CC: [name], MD [address] [place] [address] Phone: [phone]  Fax: [phone]  Return to Endocrinology clinic as below: Future Appointments  Date Time Provider [place]  02/24/2019  8:30 AM [facility] PA/NP [facility] None  03/04/2019  9:10 AM Shamleffer, Melanie Crazier, MD [facility] None  06/10/2019  9:20 AM Nahser, Wonda Cheng, MD [facility]  06/21/2019 10:00 AM Kathrynn Ducking, MD [facility] None

## 2019-02-07 ENCOUNTER — Telehealth: Payer: Self-pay | Admitting: Internal Medicine

## 2019-02-07 ENCOUNTER — Other Ambulatory Visit: Payer: Self-pay

## 2019-02-07 DIAGNOSIS — E1165 Type 2 diabetes mellitus with hyperglycemia: Principal | ICD-10-CM

## 2019-02-07 DIAGNOSIS — E1142 Type 2 diabetes mellitus with diabetic polyneuropathy: Secondary | ICD-10-CM

## 2019-02-07 MED ORDER — FREESTYLE LIBRE 14 DAY READER DEVI: 1.0000 | Freq: Three times a day (TID) | 0 refills | Status: AC

## 2019-02-07 MED ORDER — FREESTYLE LIBRE 14 DAY READER DEVI: 1.0000 | Freq: Three times a day (TID) | 0 refills | Status: DC

## 2019-02-07 MED ORDER — FREESTYLE LIBRE 14 DAY SENSOR MISC: 1.0000 | Freq: Three times a day (TID) | 11 refills | Status: DC

## 2019-02-07 MED ORDER — FREESTYLE LIBRE 14 DAY SENSOR MISC: 1.0000 | Freq: Three times a day (TID) | 11 refills | Status: AC

## 2019-02-07 NOTE — Telephone Encounter (Signed)
Patient stated Dr Kelton Pillar would be sending in the Free style libra on Friday. When the patient went to the pharmacy to pick this up the pharmacy said they only received the needles prescription from our office.    [place], [place]

## 2019-02-07 NOTE — Telephone Encounter (Signed)
Rx sent 

## 2019-02-08 ENCOUNTER — Other Ambulatory Visit (HOSPITAL_COMMUNITY): Payer: Self-pay

## 2019-02-08 DIAGNOSIS — R350 Frequency of micturition: Secondary | ICD-10-CM | POA: Diagnosis not present

## 2019-02-08 DIAGNOSIS — N3941 Urge incontinence: Secondary | ICD-10-CM | POA: Diagnosis not present

## 2019-02-08 MED ORDER — TORSEMIDE 20 MG PO TABS: 80.0000 mg | ORAL_TABLET | Freq: Two times a day (BID) | ORAL | 11 refills | Status: DC

## 2019-02-09 ENCOUNTER — Other Ambulatory Visit (HOSPITAL_COMMUNITY): Payer: Self-pay | Admitting: Internal Medicine

## 2019-02-09 DIAGNOSIS — I5033 Acute on chronic diastolic (congestive) heart failure: Secondary | ICD-10-CM | POA: Diagnosis not present

## 2019-02-09 DIAGNOSIS — M4800 Spinal stenosis, site unspecified: Secondary | ICD-10-CM | POA: Diagnosis not present

## 2019-02-09 DIAGNOSIS — G9340 Encephalopathy, unspecified: Secondary | ICD-10-CM | POA: Diagnosis not present

## 2019-02-10 DIAGNOSIS — B351 Tinea unguium: Secondary | ICD-10-CM | POA: Diagnosis not present

## 2019-02-10 DIAGNOSIS — I130 Hypertensive heart and chronic kidney disease with heart failure and stage 1 through stage 4 chronic kidney disease, or unspecified chronic kidney disease: Secondary | ICD-10-CM | POA: Diagnosis not present

## 2019-02-10 DIAGNOSIS — Z794 Long term (current) use of insulin: Secondary | ICD-10-CM | POA: Diagnosis not present

## 2019-02-10 DIAGNOSIS — S90221A Contusion of right lesser toe(s) with damage to nail, initial encounter: Secondary | ICD-10-CM | POA: Diagnosis not present

## 2019-02-10 DIAGNOSIS — E119 Type 2 diabetes mellitus without complications: Secondary | ICD-10-CM | POA: Diagnosis not present

## 2019-02-15 DIAGNOSIS — R3915 Urgency of urination: Secondary | ICD-10-CM | POA: Diagnosis not present

## 2019-02-15 DIAGNOSIS — R350 Frequency of micturition: Secondary | ICD-10-CM | POA: Diagnosis not present

## 2019-02-19 DIAGNOSIS — M4800 Spinal stenosis, site unspecified: Secondary | ICD-10-CM | POA: Diagnosis not present

## 2019-02-19 DIAGNOSIS — G9340 Encephalopathy, unspecified: Secondary | ICD-10-CM | POA: Diagnosis not present

## 2019-02-19 DIAGNOSIS — I5033 Acute on chronic diastolic (congestive) heart failure: Secondary | ICD-10-CM | POA: Diagnosis not present

## 2019-02-22 DIAGNOSIS — R350 Frequency of micturition: Secondary | ICD-10-CM | POA: Diagnosis not present

## 2019-02-22 DIAGNOSIS — N3941 Urge incontinence: Secondary | ICD-10-CM | POA: Diagnosis not present

## 2019-02-24 ENCOUNTER — Encounter (HOSPITAL_COMMUNITY): Payer: PPO

## 2019-02-24 DIAGNOSIS — R300 Dysuria: Secondary | ICD-10-CM | POA: Diagnosis not present

## 2019-02-24 DIAGNOSIS — R8290 Unspecified abnormal findings in urine: Secondary | ICD-10-CM | POA: Diagnosis not present

## 2019-03-01 DIAGNOSIS — N3941 Urge incontinence: Secondary | ICD-10-CM | POA: Diagnosis not present

## 2019-03-01 DIAGNOSIS — R350 Frequency of micturition: Secondary | ICD-10-CM | POA: Diagnosis not present

## 2019-03-02 ENCOUNTER — Ambulatory Visit (HOSPITAL_COMMUNITY)
Admission: RE | Admit: 2019-03-02 | Discharge: 2019-03-02 | Disposition: A | Discharge status: Home / Self Care | Payer: PPO | Source: Ambulatory Visit | Attending: Internal Medicine | Admitting: Internal Medicine

## 2019-03-02 ENCOUNTER — Encounter (HOSPITAL_COMMUNITY): Payer: Self-pay

## 2019-03-02 VITALS — BP 140/86 | HR 66 | Wt 277.4 lb

## 2019-03-02 DIAGNOSIS — Z79899 Other long term (current) drug therapy: Secondary | ICD-10-CM | POA: Insufficient documentation

## 2019-03-02 DIAGNOSIS — I130 Hypertensive heart and chronic kidney disease with heart failure and stage 1 through stage 4 chronic kidney disease, or unspecified chronic kidney disease: Secondary | ICD-10-CM | POA: Insufficient documentation

## 2019-03-02 DIAGNOSIS — N183 Chronic kidney disease, stage 3 unspecified: Secondary | ICD-10-CM

## 2019-03-02 DIAGNOSIS — Z833 Family history of diabetes mellitus: Secondary | ICD-10-CM | POA: Insufficient documentation

## 2019-03-02 DIAGNOSIS — E1122 Type 2 diabetes mellitus with diabetic chronic kidney disease: Secondary | ICD-10-CM | POA: Diagnosis not present

## 2019-03-02 DIAGNOSIS — R001 Bradycardia, unspecified: Secondary | ICD-10-CM | POA: Diagnosis not present

## 2019-03-02 DIAGNOSIS — Z888 Allergy status to other drugs, medicaments and biological substances status: Secondary | ICD-10-CM | POA: Diagnosis not present

## 2019-03-02 DIAGNOSIS — R5381 Other malaise: Secondary | ICD-10-CM | POA: Diagnosis not present

## 2019-03-02 DIAGNOSIS — Z881 Allergy status to other antibiotic agents status: Secondary | ICD-10-CM | POA: Diagnosis not present

## 2019-03-02 DIAGNOSIS — Z884 Allergy status to anesthetic agent status: Secondary | ICD-10-CM | POA: Diagnosis not present

## 2019-03-02 DIAGNOSIS — I2510 Atherosclerotic heart disease of native coronary artery without angina pectoris: Secondary | ICD-10-CM

## 2019-03-02 DIAGNOSIS — Z79890 Hormone replacement therapy (postmenopausal): Secondary | ICD-10-CM | POA: Insufficient documentation

## 2019-03-02 DIAGNOSIS — Z9981 Dependence on supplemental oxygen: Secondary | ICD-10-CM | POA: Insufficient documentation

## 2019-03-02 DIAGNOSIS — R188 Other ascites: Secondary | ICD-10-CM | POA: Diagnosis not present

## 2019-03-02 DIAGNOSIS — I4510 Unspecified right bundle-branch block: Secondary | ICD-10-CM | POA: Diagnosis not present

## 2019-03-02 DIAGNOSIS — Z794 Long term (current) use of insulin: Secondary | ICD-10-CM | POA: Diagnosis not present

## 2019-03-02 DIAGNOSIS — J9611 Chronic respiratory failure with hypoxia: Secondary | ICD-10-CM | POA: Diagnosis not present

## 2019-03-02 DIAGNOSIS — G2581 Restless legs syndrome: Secondary | ICD-10-CM | POA: Diagnosis not present

## 2019-03-02 DIAGNOSIS — I5032 Chronic diastolic (congestive) heart failure: Secondary | ICD-10-CM | POA: Insufficient documentation

## 2019-03-02 DIAGNOSIS — G4733 Obstructive sleep apnea (adult) (pediatric): Secondary | ICD-10-CM | POA: Insufficient documentation

## 2019-03-02 DIAGNOSIS — Z7982 Long term (current) use of aspirin: Secondary | ICD-10-CM | POA: Diagnosis not present

## 2019-03-02 DIAGNOSIS — J449 Chronic obstructive pulmonary disease, unspecified: Secondary | ICD-10-CM | POA: Insufficient documentation

## 2019-03-02 DIAGNOSIS — E039 Hypothyroidism, unspecified: Secondary | ICD-10-CM | POA: Insufficient documentation

## 2019-03-02 DIAGNOSIS — E1140 Type 2 diabetes mellitus with diabetic neuropathy, unspecified: Secondary | ICD-10-CM | POA: Insufficient documentation

## 2019-03-02 DIAGNOSIS — Z882 Allergy status to sulfonamides status: Secondary | ICD-10-CM | POA: Insufficient documentation

## 2019-03-02 DIAGNOSIS — Z8249 Family history of ischemic heart disease and other diseases of the circulatory system: Secondary | ICD-10-CM | POA: Insufficient documentation

## 2019-03-02 DIAGNOSIS — Z9989 Dependence on other enabling machines and devices: Secondary | ICD-10-CM

## 2019-03-02 DIAGNOSIS — R269 Unspecified abnormalities of gait and mobility: Secondary | ICD-10-CM | POA: Diagnosis not present

## 2019-03-02 DIAGNOSIS — Z87891 Personal history of nicotine dependence: Secondary | ICD-10-CM | POA: Diagnosis not present

## 2019-03-02 LAB — CBC
HCT: 41.4 % (ref 39.0–52.0)
Hemoglobin: 13.1 g/dL (ref 13.0–17.0)
MCH: 29.2 pg (ref 26.0–34.0)
MCHC: 31.6 g/dL (ref 30.0–36.0)
MCV: 92.2 fL (ref 80.0–100.0)
NRBC: 0 % (ref 0.0–0.2)
Platelets: 211 10*3/uL (ref 150–400)
RBC: 4.49 MIL/uL (ref 4.22–5.81)
RDW: 14 % (ref 11.5–15.5)
WBC: 6.8 10*3/uL (ref 4.0–10.5)

## 2019-03-02 LAB — BASIC METABOLIC PANEL
Anion gap: 13 (ref 5–15)
BUN: 61 mg/dL — ABNORMAL HIGH (ref 8–23)
CO2: 28 mmol/L (ref 22–32)
Calcium: 9 mg/dL (ref 8.9–10.3)
Chloride: 92 mmol/L — ABNORMAL LOW (ref 98–111)
Creatinine, Ser: 1.9 mg/dL — ABNORMAL HIGH (ref 0.61–1.24)
GFR, EST AFRICAN AMERICAN: 39 mL/min — AB (ref >60)
GFR, EST NON AFRICAN AMERICAN: 34 mL/min — AB (ref >60)
Glucose, Bld: 329 mg/dL — ABNORMAL HIGH (ref 70–99)
Potassium: 3.5 mmol/L (ref 3.5–5.1)
Sodium: 133 mmol/L — ABNORMAL LOW (ref 135–145)

## 2019-03-02 LAB — BRAIN NATRIURETIC PEPTIDE: B Natriuretic Peptide: 44.4 pg/mL (ref 0.0–100.0)

## 2019-03-02 MED ORDER — METOLAZONE 2.5 MG PO TABS: 2.5000 mg | ORAL_TABLET | ORAL | 6 refills | Status: DC

## 2019-03-02 NOTE — Patient Instructions (Addendum)
Lab work done today. We will contact you for any abnormal lab work.  Your Provider recommends that you have a paracentesis done. This will be scheduled today.  Follow up with Dr. [name] in 6-8 weeks.

## 2019-03-02 NOTE — Progress Notes (Signed)
 ADVANCED HF CLINIC NOTE  Referring Physician: Scott Weaver PA Primary Care: Dr Ehringer  Primary Cardiologist: Dr Nahser  Pulmonary: Dr Sood HF MD: Dr Bensimhon  HPI: Travis [name] is a 73 y.o. 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   Admitted 04/08/2018 for decompression lumber laminectomy. Hospital course was complicated by respiratory failure and severe deconditioning. He was discharged to CIR and went home on 05/12/2018.   Admitted 7/1 through 07/04/18. Admitted with AMS due to CO2 retention/hypercarbic respiratory failure due to CO2 retention and A/C diastolic heart failure. BB decreased in the hospital due to bradycardic. Discharge weight 275 pounds.  Admitted 11/23 - 11/25 with complicated UTI (UCx with Pseudomonas). AKI noted on labs. Treated with IV ABX.   He presents today for regular follow up. He has been feeling about the same. Remains SOB with exertion. SOB standing to weigh, with bathing, and dressing. He has orthopnea when he initially lies flat, that resolves after repositioning. Trying to watch salt and fluid intake. Taking all medication as directed. His L leg stays somewhat more swollen than his R, and this is chronic for at least over a year. Wears CPAP every night.    Cardiac Testing  PYP scan 11/26/18 with Grade 1, H/CLL 1.45. Equivocal. Personally reviewed by Dr. Bensimhon. Negative.  Echocardiogram 06/29/2018 EF 55-60, normal wall motion, grade 1 diastolic dysfunction Echocardiogram 03/06/17 Mild LVH, EF 65-70, normal wall motion, grade 2 diastolic dysfunction, mild LAE Echo 08/21/15 EF 55-60, normal wall motion, grade 2 diastolic dysfunction, mild LAE  R/L cardiac catheterization 09/07/15 LM 40 LAD distal 40 LCx ostial 50 RCA proximal 40 EF 55-60 LVEDP 16, mean RA 4, PASP 45, mean PCWP 13  Nuclear stress test 03/19/15 EF 48, no ischemia, low  risk  Review of systems complete and found to be negative unless listed in HPI.   Past Medical History:  Diagnosis Date  . Cervical myelopathy (HCC)    with myelomalacia at C5-6 and C3-4  . CHF (congestive heart failure) (HCC)   . Constipation - functional    controlled with miralax  . COPD (chronic obstructive pulmonary disease) (HCC)    mild  . DJD (degenerative joint disease)    WHOLE SPINE  . Dyspnea   . Gait disorder   . Heart murmur    years ago   . Hypertension   . Hypothyroidism    Low d/t treatment of hyperthyroidism  . Lumbosacral spinal stenosis 03/13/2014  . On home oxygen therapy    "2.5L all the time" (06/29/2018)  . Peripheral neuropathy   . Pneumonia   . PONV (postoperative nausea and vomiting)   . RBBB   . Renal disorder    acute kidney failure  . Restless legs syndrome 03/14/2015  . RLS (restless legs syndrome)   . Sleep apnea    DX  2009/2010 study done at [facility]  . 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 Type II diabetes mellitus (HCC)    diet controlled    Current Outpatient Medications  Medication Sig Dispense Refill  . aspirin EC 81 MG EC tablet Take 1 tablet (81 mg total) by mouth daily. 30 tablet 0  . atorvastatin (LIPITOR) 20 MG tablet Take 1 tablet (20 mg total) by mouth daily. 90 tablet 3  . docusate sodium (COLACE) 100 MG capsule Take 200 mg by mouth at bedtime.    . escitalopram (  LEXAPRO) 20 MG tablet Take 20 mg by mouth daily.    . gabapentin (NEURONTIN) 400 MG capsule TAKE 1 CAPSULE (400 MG TOTAL) BY MOUTH EVERY 8 (EIGHT) HOURS. (Patient taking differently: Take 400 mg by mouth 3 (three) times daily. ) 90 capsule 0  . hydrALAZINE (APRESOLINE) 50 MG tablet Take 1.5 tablets (75 mg total) by mouth 3 (three) times daily. 378 tablet 3  . insulin aspart (NOVOLOG FLEXPEN) 100 UNIT/ML FlexPen Inject 14 Units into the skin 3 (three) times daily with meals. 15 mL 11  . Insulin Glargine (LANTUS SOLOSTAR) 100 UNIT/ML Solostar Pen Inject 48 Units into  the skin at bedtime. 15 mL 11  . Insulin Pen Needle (BD PEN NEEDLE MICRO U/F) 32G X 6 MM MISC 4 times a day 150 each 11  . Insulin Pen Needle (BD PEN NEEDLE MICRO U/F) 32G X 6 MM MISC Four times daily 150 each 6  . ketoconazole (NIZORAL) 2 % cream Apply 1 application topically daily. 120 g 0  . levothyroxine (SYNTHROID, LEVOTHROID) 175 MCG tablet Take 1 tablet (175 mcg total) by mouth daily. 30 tablet 2  . metolazone (ZAROXOLYN) 2.5 MG tablet Take 1 tablet (2.5 mg total) by mouth once a week. Fridays 4 tablet 6  . metoprolol tartrate (LOPRESSOR) 25 MG tablet Take 0.5 tablets (12.5 mg total) by mouth daily. 45 tablet 3  . multivitamin (ONE-A-DAY MEN'S) TABS tablet Take 1 tablet by mouth at bedtime.    . polyethylene glycol (MIRALAX / GLYCOLAX) packet Take 17 g by mouth daily as needed. For constipation (Patient taking differently: Take 17 g by mouth at bedtime. ) 14 each 0  . pramipexole (MIRAPEX) 0.5 MG tablet One tablet in the morning and 2 in the evening 270 tablet 1  . spironolactone (ALDACTONE) 25 MG tablet Take 1 tablet (25 mg total) by mouth daily. 90 tablet 3  . tamsulosin (FLOMAX) 0.4 MG CAPS capsule Take 1 capsule (0.4 mg total) by mouth daily. (Patient taking differently: Take 0.4 mg by mouth at bedtime. ) 30 capsule 0  . torsemide (DEMADEX) 20 MG tablet Take 4 tablets (80 mg total) by mouth 2 (two) times daily. 240 tablet 11  . TOVIAZ 8 MG TB24 tablet Take 1 tablet (8 mg total) by mouth daily. 30 tablet 11  . Continuous Blood Gluc Receiver (FREESTYLE LIBRE 14 DAY READER) DEVI 1 kit by Does not apply route 3 (three) times daily. 1 Device 0  . Continuous Blood Gluc Sensor (FREESTYLE LIBRE 14 DAY SENSOR) MISC 1 kit by Does not apply route 3 (three) times daily. 3 each 11   No current facility-administered medications for this encounter.     Allergies  Allergen Reactions  . Amlodipine Swelling and Other (See Comments)    Causes edema (per [facility] paperwork)  . Plendil  [Felodipine] Swelling and Other (See Comments)    Causes edema (per [facility] paperwork)  . Compazine [Prochlorperazine] Other (See Comments)    Caused agitation (per [facility] paperwork)  . Cymbalta [Duloxetine Hcl] Other (See Comments)    Made the patient "feel weird," per [facility] paperwork  . Jardiance [Empagliflozin] Other (See Comments)    Stopped by MD because of unwanted side effects  . Other Nausea Only and Other (See Comments)    ANESTHESIA UNSPECIFIED- makes sick, must have anti-nausea medication in advance  . Levofloxacin In D5w Itching    Possible infusion reaction 03/29/17. Tolerating oral Levaquin 03/30/17  . Septra [Sulfamethoxazole-Trimethoprim] Nausea Only and Other (  See Comments)    Per [facility] paperwork      Social History   Socioeconomic History  . Marital status: Married    Spouse name: carolyn  . Number of children: 3  . Years of education: 16  . Highest education level: Not on file  Occupational History    Employer: [employer]    Comment: Works in Insurance business  Social Needs  . Financial resource strain: Not on file  . Food insecurity:    Worry: Not on file    Inability: Not on file  . Transportation needs:    Medical: Not on file    Non-medical: Not on file  Tobacco Use  . Smoking status: Former Smoker    Packs/day: 2.00    Years: 20.00    Pack years: 40.00    Types: Cigarettes    Last attempt to quit: 06/28/1985    Years since quitting: 33.6  . Smokeless tobacco: Former User    Quit date: 06/28/1985  Substance and Sexual Activity  . Alcohol use: No  . Drug use: No  . Sexual activity: Not on file  Lifestyle  . Physical activity:    Days per week: Not on file    Minutes per session: Not on file  . Stress: Not on file  Relationships  . Social connections:    Talks on phone: Not on file    Gets together: Not on file    Attends religious service: Not on file    Active member of club or  organization: Not on file    Attends meetings of clubs or organizations: Not on file    Relationship status: Not on file  . Intimate partner violence:    Fear of current or ex partner: Not on file    Emotionally abused: Not on file    Physically abused: Not on file    Forced sexual activity: Not on file  Other Topics Concern  . Not on file  Social History Narrative   Lives with wife,  Carolyn   Patient is married with 3 children.   Patient has 16 yrs of education.   Patient is right handed.   Patient drinks 2 cups of caffeine per day.      Family History  Problem Relation Age of Onset  . Polycythemia Mother   . Diabetes Father   . Hypertension Father   . Heart disease Father   . CAD Unknown        1 of his 4 brothers and father has CAD    Vitals:   03/02/19 1137  BP: 140/86  Pulse: 66  SpO2: (!) 88%  Weight: 125.8 kg (277 lb 6.4 oz)   Wt Readings from Last 3 Encounters:  03/02/19 125.8 kg (277 lb 6.4 oz)  02/04/19 125.6 kg (277 lb)  02/03/19 126.3 kg (278 lb 6.4 oz)    PHYSICAL EXAM: General: NAD. In WC HEENT: Normal Neck: Supple. JVP 10-11 cm at least. Carotids 2+ bilat; no bruits. No thyromegaly or nodule noted. Cor: PMI nondisplaced. RRR, No M/G/R noted Lungs: CTAB, normal effort. Abdomen: Soft, non-tender, non-distended, no HSM. No bruits or masses. +BS  Extremities: No cyanosis, clubbing, or rash. BLE 1-2+ edema L>R.  Neuro: Alert & orientedx3, cranial nerves grossly intact. moves all 4 extremities w/o difficulty. Affect pleasant   General: Arrived in wheel chair. No resp difficulty HEENT: normal Neck: supple. JVP to jaw. Carotids 2+ bilat; no bruits. No lymphadenopathy or thryomegaly appreciated. Cor:   PMI nondisplaced. Regular rate & rhythm. No rubs, gallops or murmurs. Lungs: clear Abdomen: soft, nontender, nondistended. No hepatosplenomegaly. No bruits or masses. Good bowel sounds. Extremities: no cyanosis, clubbing, rash, R and LLE 1-2+  edema Neuro: alert & orientedx3, cranial nerves grossly intact. moves all 4 extremities w/o difficulty. Affect pleasant   ASSESSMENT & PLAN:  1. Chronic Diastolic Heart Failure with R>>L symptoms - ECHO 06/29/2018 EF 55-60% Grade IDD  - Suspect due to R-sided HF from OSA, OHS and COPD - Myeloma panel with no M-spike, normal IFE.  - NYHA III-IIIb - Volume status somewhat elevated on exam.  - Will send for paracentesis with distended, tight abdomen.  - Continue torsemide 80 mg BID - Continue metolazone 2.5 mg every Friday. Extra as needed.  - Continue spiro 25 daily - Intolerant to Jardiance with UTI and rash. 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 Reviewed by Dr. Bensimhon and determined to be negative.   2.Chronic Hypoxic Respiratory Failure/COPD - Continue 3L O2 qHS.  - May need to wear with exertion.   3. CKD Stage III - Creatinine baseline 1.91  - BMET today.   4. HTN - Stable at home.   5. Hypothyroidism  - Per PCP.   6. Bradycardia - Stable.   7. OSA - Encouraged nightly BiPAP. Recent adjustments.  - Follows with Dr. Sood. No change.   8. DM2 - Follows with Dr. Ehringer. Had UTI with jardiance. No change.   9. CAD - Non-obstructive. LHC 08/2015: LM 40, LAD distal 40, LCx ostial 50, RCA proximal 40 - No s/s of ischemia.    - On ASA, lopressor 12.5 mg daily - Continue statin.   10. Deconditioning - He is not very active. Finished [facility] PT. Encouraged activity.   Recommended compression hose and extra metolazone as needed. Will schedule for US guided paracentesis with concerns for ascites.   Michael Andrew Tillery, [name]  11:41 AM  Greater than 50% of the 25 minute visit was spent in counseling/coordination of care regarding disease state education, salt/fluid restriction, sliding scale diuretics, and medication compliance.  

## 2019-03-04 ENCOUNTER — Ambulatory Visit (HOSPITAL_COMMUNITY)
Admission: RE | Admit: 2019-03-04 | Discharge: 2019-03-04 | Disposition: A | Discharge status: Home / Self Care | Payer: PPO | Source: Ambulatory Visit | Attending: Student | Admitting: Student

## 2019-03-04 ENCOUNTER — Telehealth: Payer: Self-pay

## 2019-03-04 ENCOUNTER — Encounter: Payer: Self-pay | Admitting: Internal Medicine

## 2019-03-04 ENCOUNTER — Ambulatory Visit: Payer: PPO | Admitting: Internal Medicine

## 2019-03-04 ENCOUNTER — Other Ambulatory Visit (HOSPITAL_COMMUNITY): Payer: Self-pay | Admitting: Student

## 2019-03-04 ENCOUNTER — Telehealth: Payer: Self-pay | Admitting: Internal Medicine

## 2019-03-04 VITALS — BP 110/60 | HR 78 | Ht 73.0 in | Wt 278.0 lb

## 2019-03-04 DIAGNOSIS — R188 Other ascites: Secondary | ICD-10-CM | POA: Diagnosis not present

## 2019-03-04 DIAGNOSIS — Z794 Long term (current) use of insulin: Secondary | ICD-10-CM | POA: Diagnosis not present

## 2019-03-04 DIAGNOSIS — R140 Abdominal distension (gaseous): Secondary | ICD-10-CM | POA: Diagnosis not present

## 2019-03-04 DIAGNOSIS — E1142 Type 2 diabetes mellitus with diabetic polyneuropathy: Secondary | ICD-10-CM | POA: Diagnosis not present

## 2019-03-04 DIAGNOSIS — N183 Chronic kidney disease, stage 3 (moderate): Secondary | ICD-10-CM | POA: Diagnosis not present

## 2019-03-04 DIAGNOSIS — E1122 Type 2 diabetes mellitus with diabetic chronic kidney disease: Secondary | ICD-10-CM

## 2019-03-04 DIAGNOSIS — E1165 Type 2 diabetes mellitus with hyperglycemia: Secondary | ICD-10-CM | POA: Diagnosis not present

## 2019-03-04 LAB — GLUCOSE, POCT (MANUAL RESULT ENTRY): POC Glucose: 248 mg/dl — AB (ref 70–99)

## 2019-03-04 MED ORDER — GLUCOSE BLOOD VI STRP: ORAL_STRIP | 12 refills | Status: AC

## 2019-03-04 MED ORDER — ONETOUCH ULTRASOFT LANCETS MISC: 12 refills | Status: AC

## 2019-03-04 MED ORDER — INSULIN GLARGINE 100 UNIT/ML SOLOSTAR PEN: 60.0000 [IU] | PEN_INJECTOR | Freq: Every day | SUBCUTANEOUS | 11 refills | Status: DC

## 2019-03-04 MED ORDER — ONETOUCH VERIO W/DEVICE KIT: 1.0000 | PACK | Freq: Four times a day (QID) | 0 refills | Status: AC

## 2019-03-04 MED ORDER — INSULIN PEN NEEDLE 32G X 6 MM MISC: 11 refills | Status: DC

## 2019-03-04 MED ORDER — INSULIN ASPART 100 UNIT/ML FLEXPEN: 18.0000 [IU] | PEN_INJECTOR | Freq: Three times a day (TID) | SUBCUTANEOUS | 11 refills | Status: DC

## 2019-03-04 NOTE — Progress Notes (Signed)
Request to IR for diagnostic and therapeutic paracentesis. Limited abdominal US performed which shows no peritoneal fluid. No procedure performed today, discussed with patient who states understanding. Discharged to home, encouraged to follow up with ordering provider.  Please call with questions or concerns.  Candiss Norse, [name] Pager# [phone]

## 2019-03-04 NOTE — Progress Notes (Signed)
Name: Travis [name]  Age/ Sex: 73 y.o., male   MRN/ DOB: [id number], 1946-10-06     PCP: [name], MD   Reason for Endocrinology Evaluation: Type 2 Diabetes Mellitus  Initial Endocrine Consultative Visit:     PATIENT IDENTIFIER: [name] is a 73 y.o. male with a past medical history of OSA, RLS, Z1IR, diastolic CHF and postablative hypothyroidism.  The patient has followed with Endocrinology clinic since 12/31/2018 for consultative assistance with management of his diabetes.  DIABETIC HISTORY:  Travis [name] was diagnosed with T2DM in 2017, he has been on ozempic and glipizide with persistent hyperglycemia,Jardiance caused recurrent genital infections,  insulin was started in December, 2019. His hemoglobin A1c has ranged from 6.7% in 2016, peaking at 11.3%  In 11/21/18   SUBJECTIVE:   During the last visit (02/04/2019): We increased lantus to 48 units, and novolog to 14 units.   Today (03/04/2019): Travis [name] is here for one-month follow-up on diabetes management. He checks his blood sugars 4 times daily, preprandial and at bedtime. The patient has not had hypoglycemic episodes since the last clinic visit.  Otherwise, the patient has not required any recent emergency interventions for hypoglycemia and has not had recent hospitalizations secondary to hyper or hypoglycemic episodes.  He was recently diagnosed with UTI and is currently on Cipro. As per patient he is scheduled to have paracentesis later today.   ROS: As per HPI and as detailed below: Review of Systems  Constitutional: Negative for fever and weight loss.  HENT: Negative for congestion and sore throat.   Respiratory: Negative for cough and shortness of breath.   Cardiovascular: Positive for leg swelling. Negative for chest pain.  Genitourinary: Positive for frequency.      HOME DIABETES REGIMEN:  Lantus 48 units daily  Novolog  14 units TIDQAC   GLUCOSE LOG : Date  Breakfast  Lunch  Supper Bedtime   01/23/19 [phone]  1/27 411 [phone]  1/28 355 404 436 453  1/29 440 505 476 504  1/30 373 441 534 462  1/31 484 498 543 400  2/1 [phone] 460     HISTORY:  Past Medical History:  Past Medical History:  Diagnosis Date  . Cervical myelopathy (HCC)    with myelomalacia at C5-6 and C3-4  . CHF (congestive heart failure) ([place])   . Constipation - functional    controlled with miralax  . COPD (chronic obstructive pulmonary disease) (HCC)    mild  . DJD (degenerative joint disease)    WHOLE SPINE  . Dyspnea   . Gait disorder   . Heart murmur    years ago   . Hypertension   . Hypothyroidism    Low d/t treatment of hyperthyroidism  . Lumbosacral spinal stenosis 03/13/2014  . On home oxygen therapy    "2.5L all the time" (06/29/2018)  . Peripheral neuropathy   . Pneumonia   . PONV (postoperative nausea and vomiting)   . RBBB   . Renal disorder    acute kidney failure  . Restless legs syndrome 03/14/2015  . RLS (restless legs syndrome)   . Sleep apnea    DX  2009/2010 study done at [facility]  . 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 Type II diabetes mellitus ([place])    diet controlled   Past Surgical History:  Past Surgical History:  Procedure Laterality Date  . BACK SURGERY     6  surgeries  . CARDIAC CATHETERIZATION [name] 09/07/2015   Procedure: Right/Left Heart Cath and Coronary Angiography;  Surgeon: Peter M Martinique, MD;  Location: [place] [facility];  Service: Cardiovascular;  Laterality: [name];  . CHOLECYSTECTOMY    . EYE SURGERY     BIL CATARACTS  . LUMBAR LAMINECTOMY/DECOMPRESSION MICRODISCECTOMY  07/14/2012   Procedure: LUMBAR LAMINECTOMY/DECOMPRESSION MICRODISCECTOMY 2 LEVELS;  Surgeon: Eustace Moore, MD;  Location: [place] [facility];  Service: Neurosurgery;  Laterality: Left;  Lumbar two three laminectomy, left thoracic four five transpedicular diskectomy  . POSTERIOR CERVICAL  FUSION/FORAMINOTOMY  04/08/2018   Procedure: Laminectomy and Foraminotomy - Lumbar Four-Lumbar Five, instrumented posterolateral fusion Lumbar Four- Five, Laminectomy and Foraminotomy - Thoracic One-Thoracic Two;  Surgeon: Eustace Moore, MD;  Location: [facility] OR;  Service: Neurosurgery;;  posterior  . POSTERIOR LUMBAR FUSION  04/08/2018   Laminectomy and Foraminotomy - Lumbar Four-Lumbar Five, instrumented posterolateral fusion Lumbar Four- Five, Laminectomy and Foraminotomy - Thoracic One-Thoracic Two  . POSTERIOR LUMBAR FUSION  04/08/2018   Laminectomy and Foraminotomy - Lumbar Four-Lumbar Five, instrumented posterolateral fusion Lumbar Four- Five, Laminectomy and Foraminotomy - Thoracic One-Thoracic Two    Social History:  reports that he quit smoking about 33 years ago. His smoking use included cigarettes. He has a 40.00 pack-year smoking history. He quit smokeless tobacco use about 33 years ago. He reports that he does not drink alcohol or use drugs. Family History:  Family History  Problem Relation Age of Onset  . Polycythemia Mother   . Diabetes Father   . Hypertension Father   . Heart disease Father   . CAD Unknown        1 of his 4 brothers and father has CAD     HOME MEDICATIONS: Allergies as of 03/04/2019      Reactions   Amlodipine Swelling, Other (See Comments)   Causes edema (per [facility] paperwork)   Plendil [felodipine] Swelling, Other (See Comments)   Causes edema (per [facility] paperwork)   Compazine [prochlorperazine] Other (See Comments)   Caused agitation (per [facility] paperwork)   Cymbalta [duloxetine Hcl] Other (See Comments)   Made the patient "feel weird," per [facility] paperwork   Jardiance [empagliflozin] Other (See Comments)   Stopped by MD because of unwanted side effects   Other Nausea Only, Other (See Comments)   ANESTHESIA UNSPECIFIED- makes sick, must have anti-nausea medication in advance   Levofloxacin In D5w Itching    Possible infusion reaction 03/29/17. Tolerating oral Levaquin 03/30/17   Septra [sulfamethoxazole-trimethoprim] Nausea Only, Other (See Comments)   Per [facility] paperwork      Medication List       Accurate as of March 04, 2019  9:27 AM. Always use your most recent med list.        aspirin 81 MG EC tablet Take 1 tablet (81 mg total) by mouth daily.   atorvastatin 20 MG tablet Commonly known as:  LIPITOR Take 1 tablet (20 mg total) by mouth daily.   docusate sodium 100 MG capsule Commonly known as:  COLACE Take 200 mg by mouth at bedtime.   escitalopram 20 MG tablet Commonly known as:  LEXAPRO Take 20 mg by mouth daily.   FreeStyle Libre 14 Day Reader Kerrin Mo 1 kit by Does not apply route 3 (three) times daily.   FreeStyle Libre 14 Day Sensor Misc 1 kit by Does not apply route 3 (three) times daily.   gabapentin 400 MG capsule Commonly known as:  NEURONTIN  TAKE 1 CAPSULE (400 MG TOTAL) BY MOUTH EVERY 8 (EIGHT) HOURS.   hydrALAZINE 50 MG tablet Commonly known as:  APRESOLINE Take 1.5 tablets (75 mg total) by mouth 3 (three) times daily.   insulin aspart 100 UNIT/ML FlexPen Commonly known as:  NovoLOG FlexPen Inject 14 Units into the skin 3 (three) times daily with meals.   Insulin Glargine 100 UNIT/ML Solostar Pen Commonly known as:  Lantus SoloStar Inject 48 Units into the skin at bedtime.   Insulin Pen Needle 32G X 6 MM Misc Commonly known as:  BD Pen Needle Micro U/F 4 times a day   Insulin Pen Needle 32G X 6 MM Misc Commonly known as:  BD Pen Needle Micro U/F Four times daily   ketoconazole 2 % cream Commonly known as:  NIZORAL Apply 1 application topically daily.   levothyroxine 175 MCG tablet Commonly known as:  SYNTHROID, LEVOTHROID Take 1 tablet (175 mcg total) by mouth daily.   metolazone 2.5 MG tablet Commonly known as:  ZAROXOLYN Take 1 tablet (2.5 mg total) by mouth once a week. Fridays   metoprolol tartrate 25 MG tablet Commonly known  as:  LOPRESSOR Take 0.5 tablets (12.5 mg total) by mouth daily.   multivitamin Tabs tablet Take 1 tablet by mouth at bedtime.   polyethylene glycol packet Commonly known as:  MIRALAX / GLYCOLAX Take 17 g by mouth daily as needed. For constipation   pramipexole 0.5 MG tablet Commonly known as:  MIRAPEX One tablet in the morning and 2 in the evening   spironolactone 25 MG tablet Commonly known as:  ALDACTONE Take 1 tablet (25 mg total) by mouth daily.   tamsulosin 0.4 MG Caps capsule Commonly known as:  FLOMAX Take 1 capsule (0.4 mg total) by mouth daily.   torsemide 20 MG tablet Commonly known as:  DEMADEX Take 4 tablets (80 mg total) by mouth 2 (two) times daily.   Toviaz 8 MG Tb24 tablet Generic drug:  fesoterodine Take 1 tablet (8 mg total) by mouth daily.        OBJECTIVE:   Vital Signs: BP 110/60 (BP Location: Right Arm, Patient Position: Sitting, Cuff Size: Large)   Pulse 78   Ht _0  (1.854 m)   Wt 278 lb (126.1 kg)   SpO2 (!) 89%   BMI 36.68 kg/m   Wt Readings from Last 3 Encounters:  03/04/19 278 lb (126.1 kg)  03/02/19 277 lb 6.4 oz (125.8 kg)  02/04/19 277 lb (125.6 kg)     Exam: General: Pt appears well and is in NAD  Lungs: Clear with good BS bilat with no rales, rhonchi, or wheezes  Heart: RRR with normal S1 and S2 and no gallops; no murmurs; no rub  Extremities: 2+ pretibial edema.   Skin: Normal texture and temperature to palpation. No rash noted. No Acanthosis nigricans/skin tags.  Neuro: MS is good with appropriate affect, pt is alert and Ox3    DM foot exam: 12/31/18 The skin of the feet is intact without sores or ulcerations. The toe nails are thickened and discolored.  The pedal pulses are 2+ on right and 2+ on left. The sensation is intact to a screening 5.07, 10 gram monofilament bilaterally       DATA REVIEWED:  Lab Results  Component Value Date   HGBA1C 13.3 (A) 02/04/2019   HGBA1C 11.2 (H) 11/22/2018   HGBA1C 11.3 (H)  11/21/2018      ASSESSMENT / PLAN / RECOMMENDATIONS:   1) Type  2 Diabetes Mellitus, poorly controlled, With neuropathic and renal (CKD III) complications - Most recent A1c of 13.3 %. Goal A1c < 7.0 %.     -We were unable to download his meter today, we were also unable to look at the dialogues on his meter, his meter was only given as a 7-day to a 30-day average. -He was unable to pick up the freestyle libre prescription, the pharmacy has been contacted and they have the prescription on hold. -Patient is requesting a new prescription for pen needles, but a new prescription was already sent on February 7 with 11 refills.  A new prescription was sent again today. -Since adjusting his insulin on the last visit his BG's have trended down from the 300-400 mg/dL , to now in the 200s mg/dL -We have discussed trying him on Ozempic again, to improve his insulin sensitivity but the patient is not keen on starting this again. -Patient with severe insulin resistant this is partly due to his weight but also due to his CHF and fluid overload status. -He is intolerant to SG L T2 inhibitors, he has tried Bydureon in the past with persistent hyperglycemia, this is something I will tease in the future once his glucose under control on an MDI regimen, is not a candidate for metformin due to low GFR   MEDICATIONS:  Increase Lantus to 60 units daily  Increase NovoLog to 18 units with each meal  EDUCATION / INSTRUCTIONS:  BG monitoring instructions: Patient is instructed to check his blood sugars 4 times a day, before meals and at bedtime.  Call [facility] if: BG persistently < 70 or > 300. . I reviewed the Rule of 15 for the treatment of hypoglycemia in detail with the patient. Literature supplied.       F/U in 6 weeks  Signed electronically by: Mack Guise, MD  [facility] Endocrinology  [address] [place], [place]  [address] Phone: [phone] FAX: [phone]   CC: [name], MD [address] [place] [address] Phone: [phone]  Fax: [phone]  Return to Endocrinology clinic as below: Future Appointments  Date Time Provider [place]  03/04/2019  1:00 PM [facility] 3 [facility]  04/27/2019 10:20 AM Bensimhon, Shaune Pascal, MD [facility] None  06/10/2019  9:20 AM Nahser, Wonda Cheng, MD [facility]  06/21/2019 10:00 AM Kathrynn Ducking, MD [facility] None

## 2019-03-04 NOTE — Telephone Encounter (Signed)
Wife states to inform the nurse that Freestyle, OneTouch, Procession is ocvered with insurance.

## 2019-03-04 NOTE — Telephone Encounter (Signed)
error 

## 2019-03-04 NOTE — Patient Instructions (Signed)
-   Increase Lantus to 60 units  Daily  - Increase Novolog to 18 units daily  - Check sugar 4 times day (befre meals and bedtime)  - Choose healthy, lower carb lower calorie snacks: toss salad, cooked vegetables, cottage cheese, peanut butter, low fat cheese / string cheese, lower sodium deli meat, tuna salad or chicken salad  - HOW TO TREAT LOW BLOOD SUGARS (Blood sugar LESS THAN 70 MG/DL)  Please follow the RULE OF 15 for the treatment of hypoglycemia treatment (when your (blood sugars are less than 70 mg/dL)    STEP 1: Take 15 grams of carbohydrates when your blood sugar is low, which includes:   3-4 GLUCOSE TABS  OR  3-4 OZ OF JUICE OR REGULAR SODA OR  ONE TUBE OF GLUCOSE GEL     STEP 2: RECHECK blood sugar in 15 MINUTES STEP 3: If your blood sugar is still low at the 15 minute recheck --> then, go back to STEP 1 and treat AGAIN with another 15 grams of carbohydrates.

## 2019-03-04 NOTE — Telephone Encounter (Signed)
lft vm for pt wife informing her that one touch and supplies have been sent to pharmacy

## 2019-03-07 ENCOUNTER — Other Ambulatory Visit: Payer: Self-pay

## 2019-03-07 ENCOUNTER — Telehealth: Payer: Self-pay | Admitting: Internal Medicine

## 2019-03-07 DIAGNOSIS — N400 Enlarged prostate without lower urinary tract symptoms: Secondary | ICD-10-CM | POA: Diagnosis not present

## 2019-03-07 DIAGNOSIS — I5030 Unspecified diastolic (congestive) heart failure: Secondary | ICD-10-CM | POA: Diagnosis not present

## 2019-03-07 DIAGNOSIS — E1129 Type 2 diabetes mellitus with other diabetic kidney complication: Secondary | ICD-10-CM | POA: Diagnosis not present

## 2019-03-07 DIAGNOSIS — I129 Hypertensive chronic kidney disease with stage 1 through stage 4 chronic kidney disease, or unspecified chronic kidney disease: Secondary | ICD-10-CM | POA: Diagnosis not present

## 2019-03-07 DIAGNOSIS — N183 Chronic kidney disease, stage 3 (moderate): Secondary | ICD-10-CM | POA: Diagnosis not present

## 2019-03-07 DIAGNOSIS — E890 Postprocedural hypothyroidism: Secondary | ICD-10-CM | POA: Diagnosis not present

## 2019-03-07 MED ORDER — INSULIN ASPART 100 UNIT/ML FLEXPEN: 18.0000 [IU] | PEN_INJECTOR | Freq: Three times a day (TID) | SUBCUTANEOUS | 11 refills | Status: DC

## 2019-03-07 NOTE — Telephone Encounter (Signed)
MEDICATION: insulin aspart (NOVOLOG FLEXPEN) 100 UNIT/ML FlexPen  PHARMACY:  [facility]  IS THIS A 90 DAY SUPPLY :   IS PATIENT OUT OF MEDICATION:   IF NOT; HOW MUCH IS LEFT:   LAST APPOINTMENT DATE: @3 /05/2019  NEXT APPOINTMENT DATE:@4 /17/2020  DO WE HAVE YOUR PERMISSION TO LEAVE A DETAILED MESSAGE:  OTHER COMMENTS:  Pharmacy is requesting an updated RX.  **Let patient know to contact pharmacy at the end of the day to make sure medication is ready. **  ** Please notify patient to allow 48-72 hours to process**  **Encourage patient to contact the pharmacy for refills or they can request refills through [facility]**

## 2019-03-07 NOTE — Telephone Encounter (Signed)
Refill sent.

## 2019-03-08 DIAGNOSIS — N3941 Urge incontinence: Secondary | ICD-10-CM | POA: Diagnosis not present

## 2019-03-08 DIAGNOSIS — R350 Frequency of micturition: Secondary | ICD-10-CM | POA: Diagnosis not present

## 2019-03-18 DIAGNOSIS — R300 Dysuria: Secondary | ICD-10-CM | POA: Diagnosis not present

## 2019-03-18 DIAGNOSIS — N3941 Urge incontinence: Secondary | ICD-10-CM | POA: Diagnosis not present

## 2019-03-18 DIAGNOSIS — R350 Frequency of micturition: Secondary | ICD-10-CM | POA: Diagnosis not present

## 2019-03-20 DIAGNOSIS — M4800 Spinal stenosis, site unspecified: Secondary | ICD-10-CM | POA: Diagnosis not present

## 2019-03-20 DIAGNOSIS — G9340 Encephalopathy, unspecified: Secondary | ICD-10-CM | POA: Diagnosis not present

## 2019-03-20 DIAGNOSIS — I5033 Acute on chronic diastolic (congestive) heart failure: Secondary | ICD-10-CM | POA: Diagnosis not present

## 2019-03-21 DIAGNOSIS — R3914 Feeling of incomplete bladder emptying: Secondary | ICD-10-CM | POA: Diagnosis not present

## 2019-03-21 DIAGNOSIS — N3941 Urge incontinence: Secondary | ICD-10-CM | POA: Diagnosis not present

## 2019-03-24 DIAGNOSIS — R3915 Urgency of urination: Secondary | ICD-10-CM | POA: Diagnosis not present

## 2019-03-24 DIAGNOSIS — R350 Frequency of micturition: Secondary | ICD-10-CM | POA: Diagnosis not present

## 2019-03-24 DIAGNOSIS — N3941 Urge incontinence: Secondary | ICD-10-CM | POA: Diagnosis not present

## 2019-04-04 IMAGING — CT CT ABD-PELV W/O CM
2 of 4 series · 17 of 46 positions shown, 19 images · non-contrast
Comparison: CT abdomen and pelvis 08/22/2015.

CLINICAL DATA: Weakness and fatigue. Patient diagnosed with the
urinary tract infection 06/25/2018.

EXAM:
CT ABDOMEN AND PELVIS WITHOUT CONTRAST
TECHNIQUE: Multidetector CT imaging of the abdomen and pelvis was performed
following the standard protocol without IV contrast.

[Series 3: abd/ pelvis 5.0 i30f 2 · axial · 0.91mm/px · z∈[+737,+1282]mm · 14 of 119 slices shown, 16 images]
[im 5/119  soft-tissue]
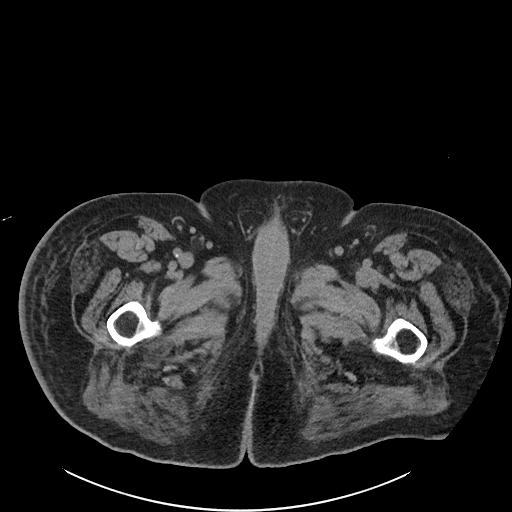
[im 5/119  bone]
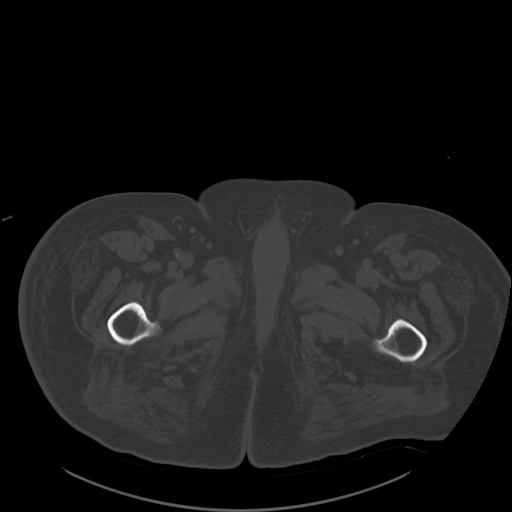
[im 15/119  soft-tissue]
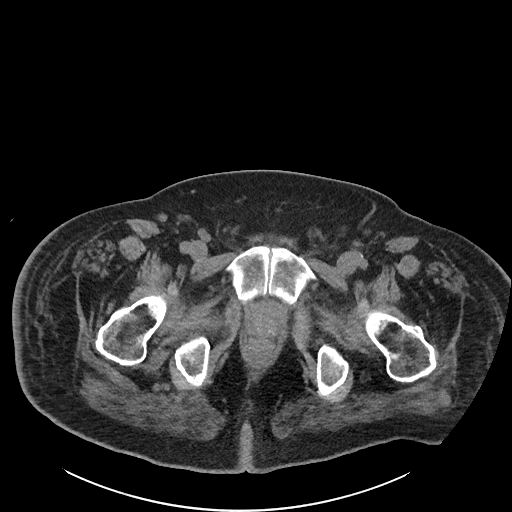
[im 24/119  soft-tissue]
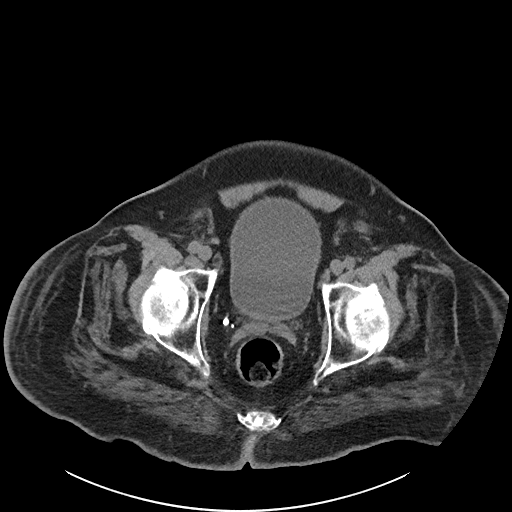
[im 34/119  soft-tissue]
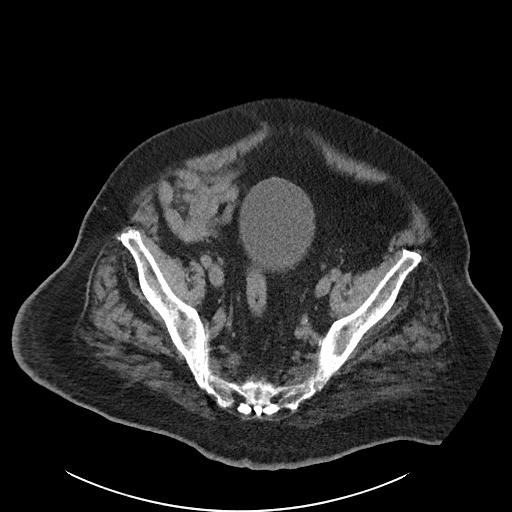
[im 38/119  soft-tissue]
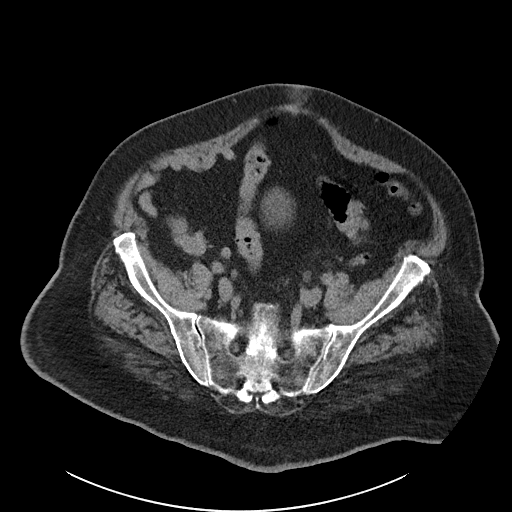
[im 48/119  soft-tissue]
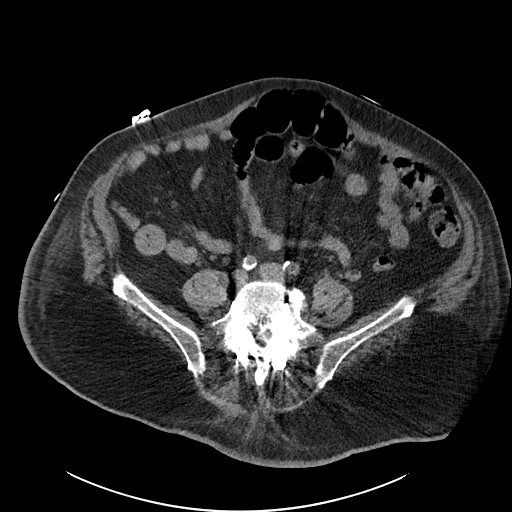
[im 57/119  soft-tissue]
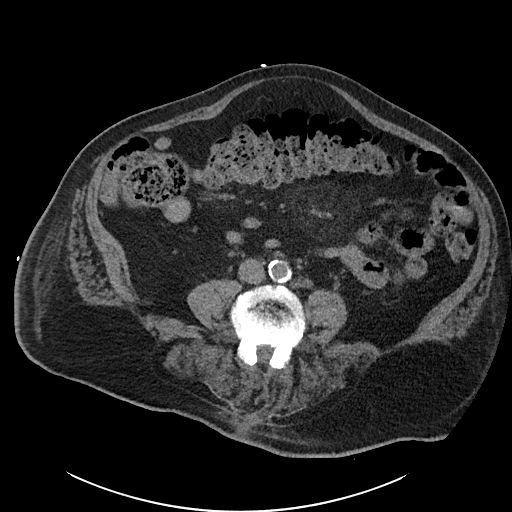
[im 62/119  soft-tissue]
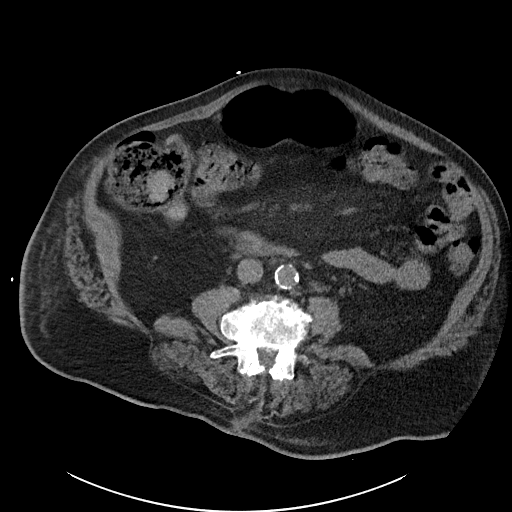
[im 71/119  soft-tissue]
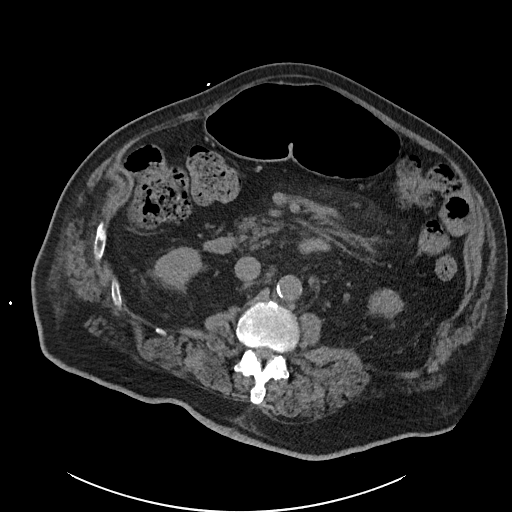
[im 71/119  bone]
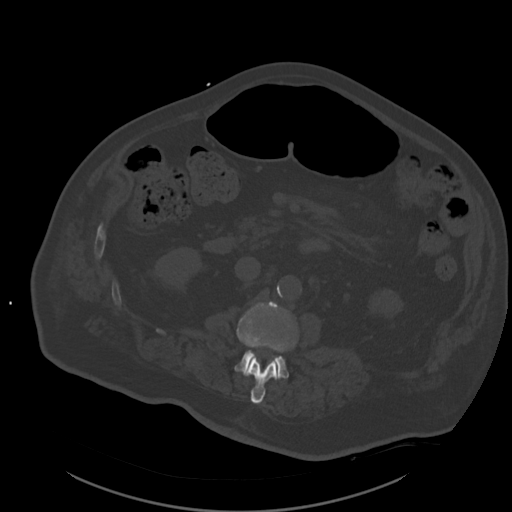
[im 81/119  soft-tissue]
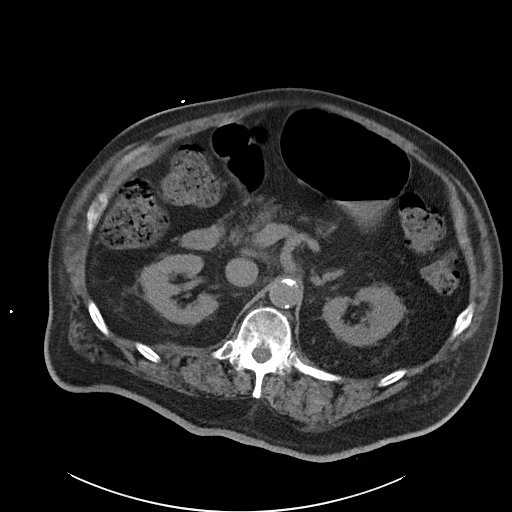
[im 90/119  soft-tissue]
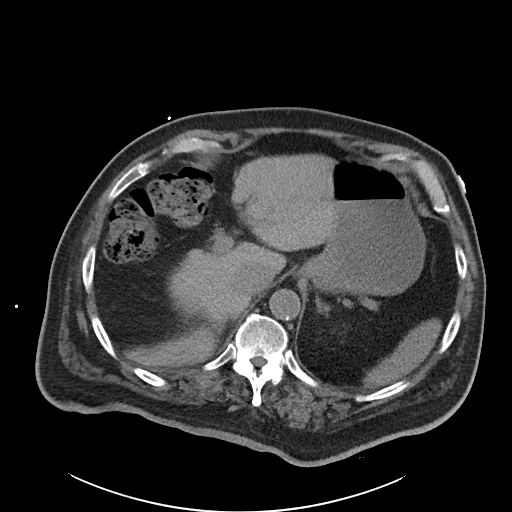
[im 95/119  soft-tissue]
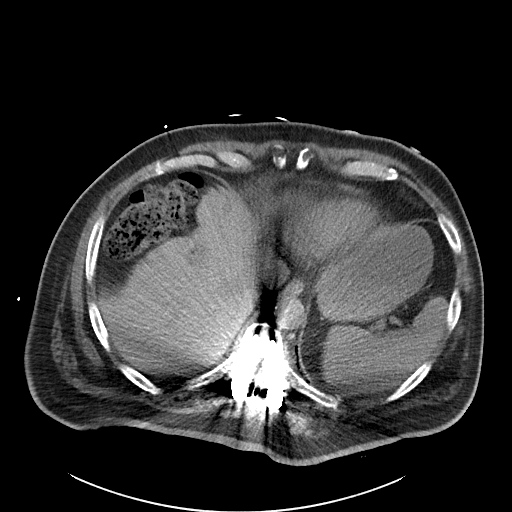
[im 104/119  soft-tissue]
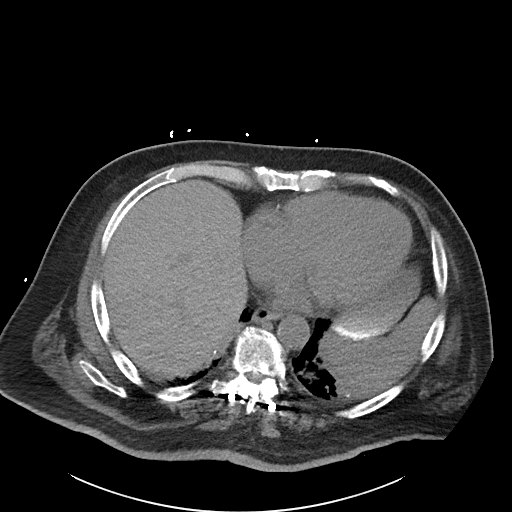
[im 114/119  soft-tissue]
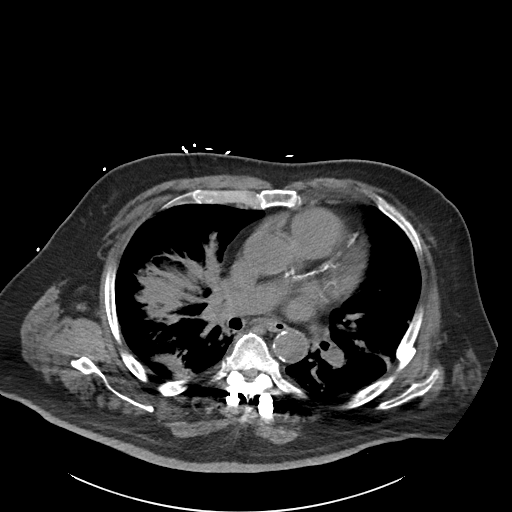

[Series 6: cor st · coronal · 1.07mm/px · 3 of 137 slices shown]
[im 46/137  soft-tissue]
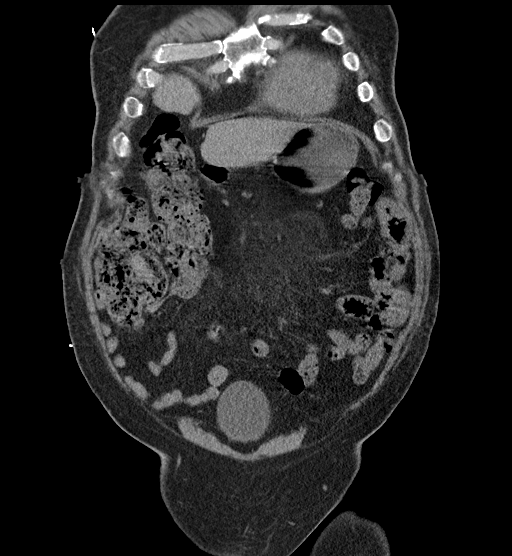
[im 61/137  soft-tissue]
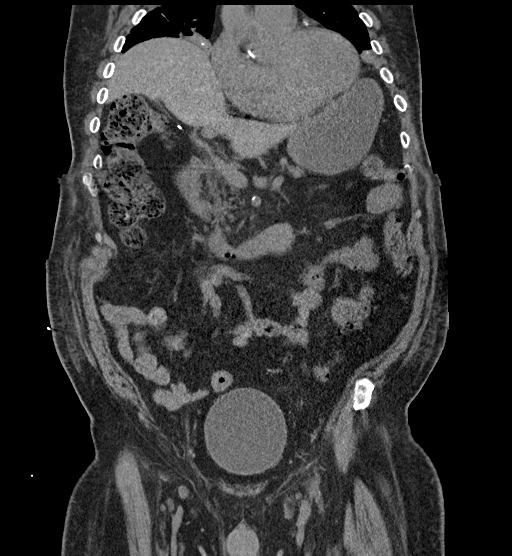
[im 76/137  soft-tissue]
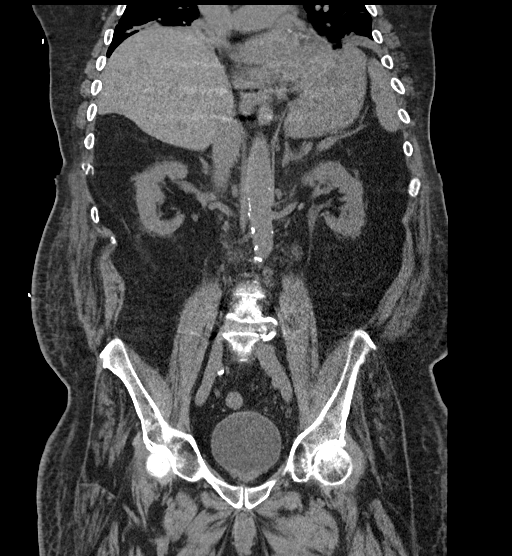

[17 of 46 positions shown; findings below may reference images not displayed]

FINDINGS: Lower chest: There is cardiomegaly. Calcific aortic and coronary
atherosclerosis is identified. Dependent atelectasis is seen in the
lung bases.

Hepatobiliary: No focal liver abnormality is seen. Status post
cholecystectomy. No biliary dilatation.

Pancreas: Unremarkable. No pancreatic ductal dilatation or
surrounding inflammatory changes. Fatty atrophy is noted

Spleen: Normal in size without focal abnormality.

Adrenals/Urinary Tract: Adrenal glands are unremarkable. Kidneys are
normal, without renal calculi, focal lesion, or hydronephrosis.
Bladder is unremarkable.

Stomach/Bowel: Stomach is within normal limits. Appendix appears
normal. No evidence of bowel wall thickening, distention, or
inflammatory changes.

Vascular/Lymphatic: Aortic atherosclerosis. No enlarged abdominal or
pelvic lymph nodes.

Reproductive: The patient appears to be status post TURP.

Other: No fluid collection.

Musculoskeletal: The patient is status post T7-10 and L4-5 fusion.
Advanced multilevel spondylosis is identified. No worrisome lesion.
No acute abnormality.
IMPRESSION: No acute abnormality abdomen or pelvis.

Cardiomegaly.

Calcific aortic and coronary atherosclerosis.

Advanced multilevel thoracic and lumbar spondylosis with
postoperative change noted.

## 2019-04-04 IMAGING — DX DG CHEST 2V
2 series · 2 of 2 positions shown · non-contrast
Comparison: 05/06/2018.

CLINICAL DATA: Weakness and fatigue.  COPD.  CHF.

EXAM:
CHEST - 2 VIEW

[chest pa]
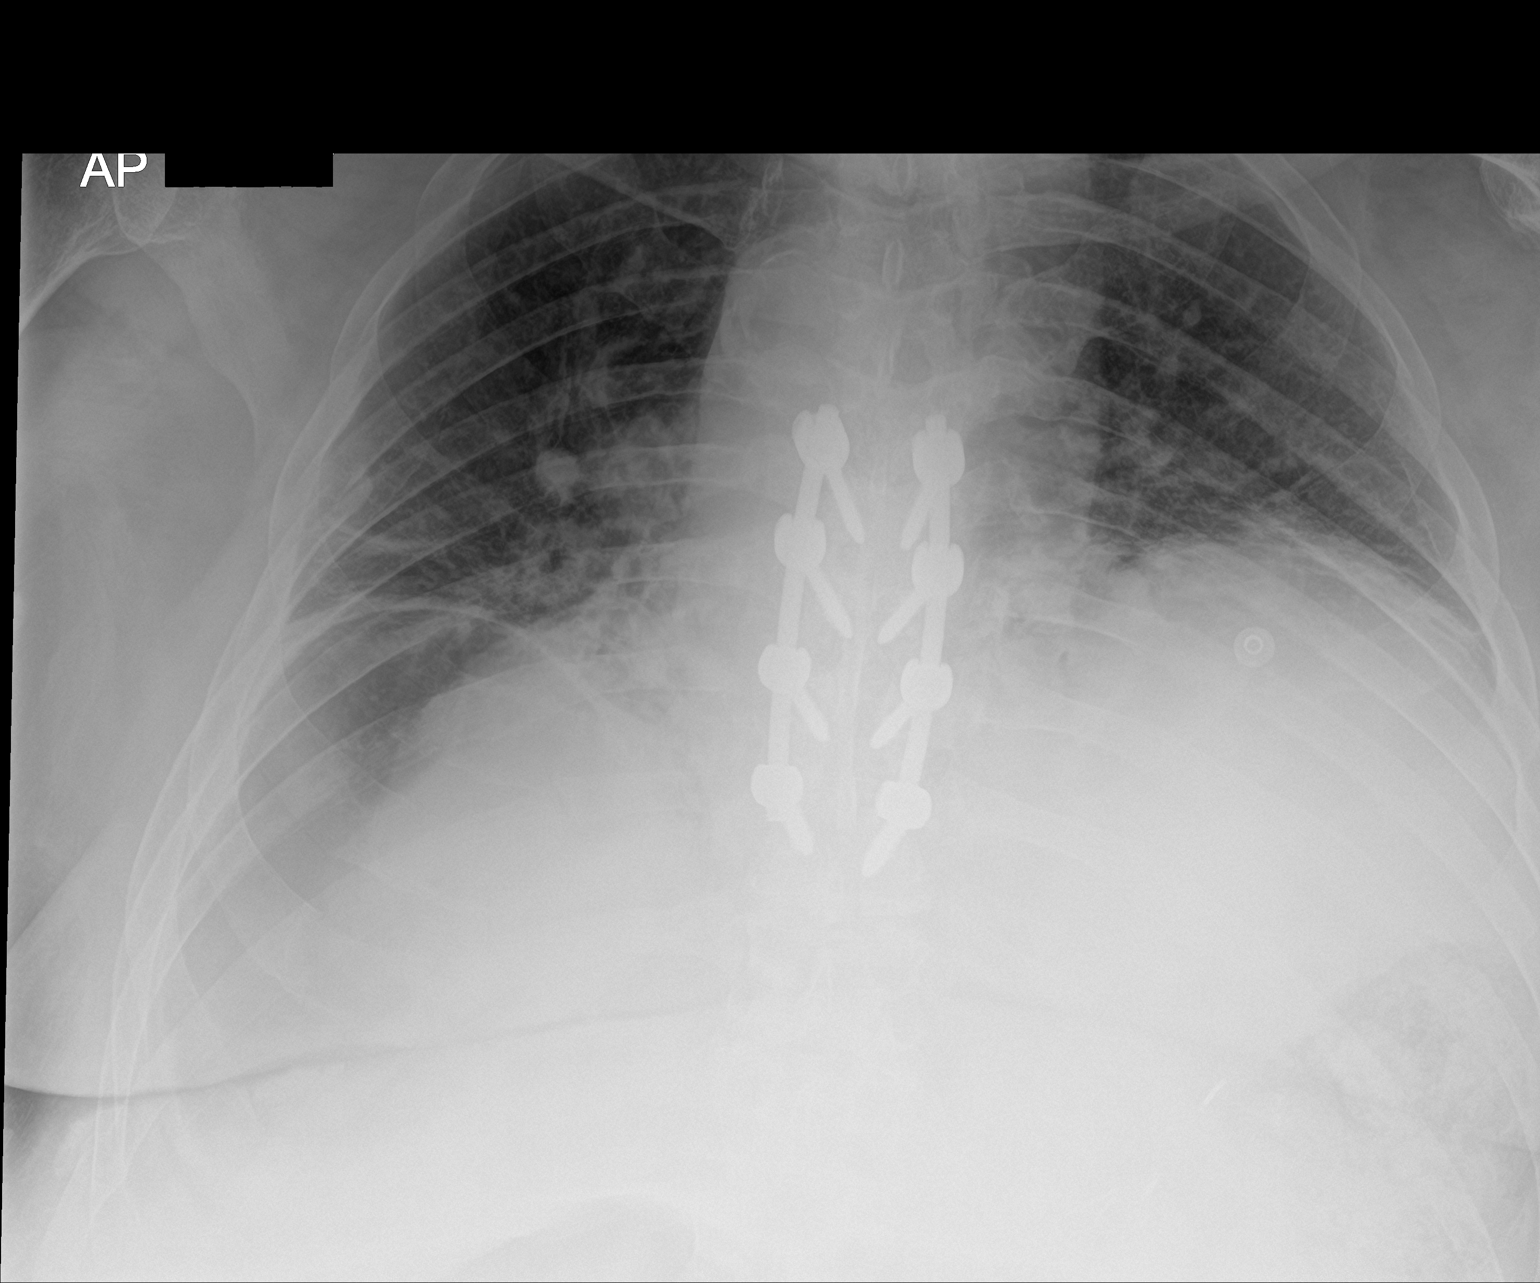

[chest lat]
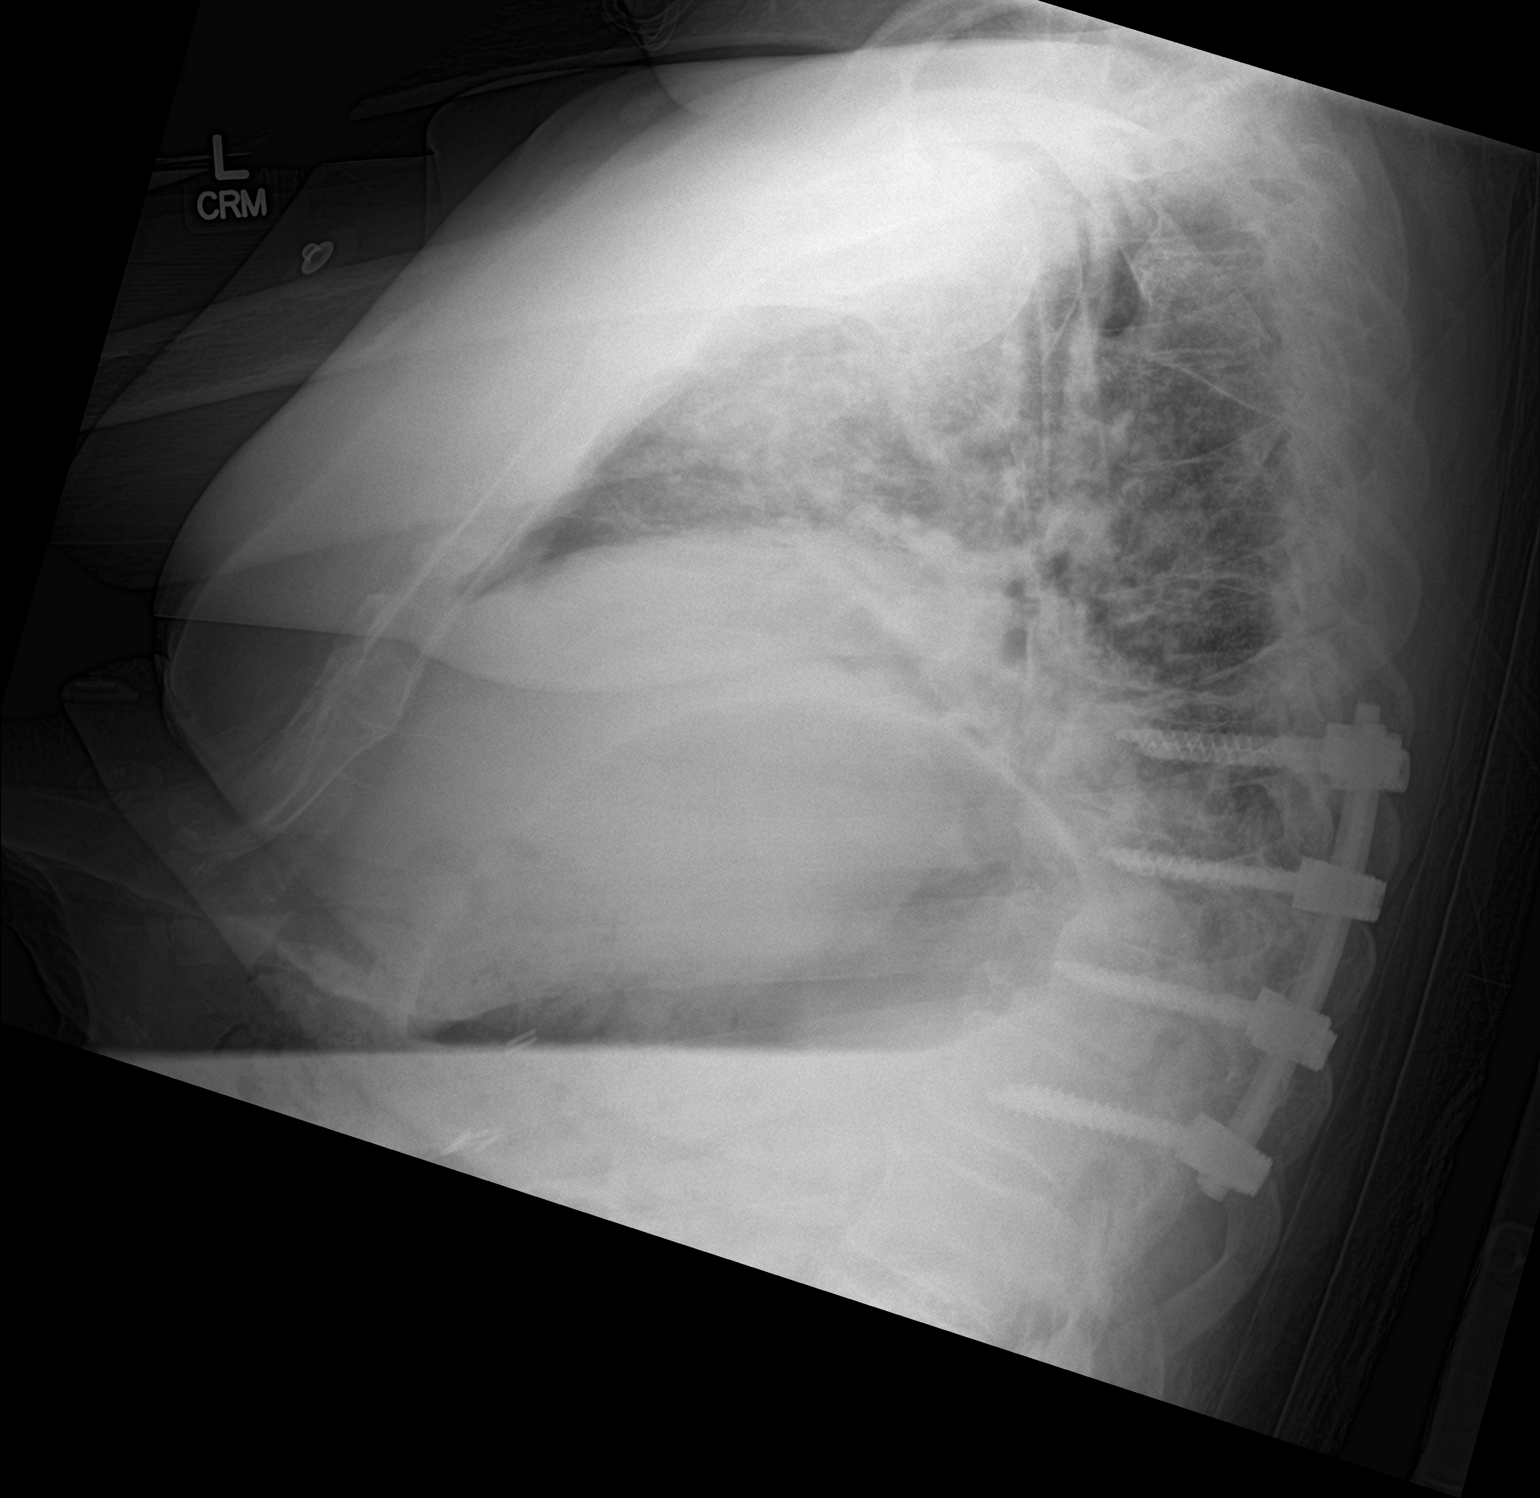

[2 of 2 positions shown; findings below may reference images not displayed]

FINDINGS: Midthoracic fusion apparatus appears stable. The heart is enlarged.
There is a considerable amount of air under the RIGHT hemidiaphragm
concerning for pneumoperitoneum. Colonic interposition could have
this appearance, but this is a new finding from previous. A lateral
decubitus film is recommended for further evaluation. Alternately,
noncontrast CT could be performed. BILATERAL pulmonary opacities are
seen representing vascular congestion, similar aeration to priors.
Bibasilar atelectasis is superimposed.
IMPRESSION: Considerable amount of air under the RIGHT hemidiaphragm concerning
for pneumoperitoneum. Colonic interposition could have a similar
appearance, and lateral decubitus film versus noncontrast CT of the
abdomen and pelvis is recommended for further evaluation.

A call has been placed to the ordering provider to directly
communicate the findings.

## 2019-04-05 DIAGNOSIS — N400 Enlarged prostate without lower urinary tract symptoms: Secondary | ICD-10-CM | POA: Diagnosis not present

## 2019-04-05 DIAGNOSIS — I129 Hypertensive chronic kidney disease with stage 1 through stage 4 chronic kidney disease, or unspecified chronic kidney disease: Secondary | ICD-10-CM | POA: Diagnosis not present

## 2019-04-05 DIAGNOSIS — I5030 Unspecified diastolic (congestive) heart failure: Secondary | ICD-10-CM | POA: Diagnosis not present

## 2019-04-05 DIAGNOSIS — E1129 Type 2 diabetes mellitus with other diabetic kidney complication: Secondary | ICD-10-CM | POA: Diagnosis not present

## 2019-04-05 DIAGNOSIS — N183 Chronic kidney disease, stage 3 (moderate): Secondary | ICD-10-CM | POA: Diagnosis not present

## 2019-04-05 DIAGNOSIS — E890 Postprocedural hypothyroidism: Secondary | ICD-10-CM | POA: Diagnosis not present

## 2019-04-06 DIAGNOSIS — K219 Gastro-esophageal reflux disease without esophagitis: Secondary | ICD-10-CM | POA: Diagnosis not present

## 2019-04-06 DIAGNOSIS — E890 Postprocedural hypothyroidism: Secondary | ICD-10-CM | POA: Diagnosis not present

## 2019-04-06 DIAGNOSIS — G4730 Sleep apnea, unspecified: Secondary | ICD-10-CM | POA: Diagnosis not present

## 2019-04-06 DIAGNOSIS — N183 Chronic kidney disease, stage 3 (moderate): Secondary | ICD-10-CM | POA: Diagnosis not present

## 2019-04-06 DIAGNOSIS — E1129 Type 2 diabetes mellitus with other diabetic kidney complication: Secondary | ICD-10-CM | POA: Diagnosis not present

## 2019-04-06 DIAGNOSIS — G629 Polyneuropathy, unspecified: Secondary | ICD-10-CM | POA: Diagnosis not present

## 2019-04-06 DIAGNOSIS — G2581 Restless legs syndrome: Secondary | ICD-10-CM | POA: Diagnosis not present

## 2019-04-06 DIAGNOSIS — F411 Generalized anxiety disorder: Secondary | ICD-10-CM | POA: Diagnosis not present

## 2019-04-06 DIAGNOSIS — I129 Hypertensive chronic kidney disease with stage 1 through stage 4 chronic kidney disease, or unspecified chronic kidney disease: Secondary | ICD-10-CM | POA: Diagnosis not present

## 2019-04-06 DIAGNOSIS — I700 Atherosclerosis of aorta: Secondary | ICD-10-CM | POA: Diagnosis not present

## 2019-04-06 DIAGNOSIS — I5030 Unspecified diastolic (congestive) heart failure: Secondary | ICD-10-CM | POA: Diagnosis not present

## 2019-04-06 DIAGNOSIS — G801 Spastic diplegic cerebral palsy: Secondary | ICD-10-CM | POA: Diagnosis not present

## 2019-04-11 ENCOUNTER — Ambulatory Visit (HOSPITAL_COMMUNITY)
Admission: RE | Admit: 2019-04-11 | Discharge: 2019-04-11 | Disposition: A | Discharge status: Home / Self Care | Payer: PPO | Source: Ambulatory Visit | Attending: Cardiology | Admitting: Cardiology

## 2019-04-11 ENCOUNTER — Telehealth (HOSPITAL_COMMUNITY): Payer: Self-pay | Admitting: Cardiology

## 2019-04-11 ENCOUNTER — Other Ambulatory Visit: Payer: Self-pay

## 2019-04-11 DIAGNOSIS — N183 Chronic kidney disease, stage 3 unspecified: Secondary | ICD-10-CM

## 2019-04-11 DIAGNOSIS — I2510 Atherosclerotic heart disease of native coronary artery without angina pectoris: Secondary | ICD-10-CM

## 2019-04-11 DIAGNOSIS — G4733 Obstructive sleep apnea (adult) (pediatric): Secondary | ICD-10-CM

## 2019-04-11 DIAGNOSIS — J9611 Chronic respiratory failure with hypoxia: Secondary | ICD-10-CM | POA: Diagnosis not present

## 2019-04-11 DIAGNOSIS — I5033 Acute on chronic diastolic (congestive) heart failure: Secondary | ICD-10-CM

## 2019-04-11 DIAGNOSIS — R5381 Other malaise: Secondary | ICD-10-CM | POA: Diagnosis not present

## 2019-04-11 DIAGNOSIS — I5032 Chronic diastolic (congestive) heart failure: Secondary | ICD-10-CM

## 2019-04-11 MED ORDER — POTASSIUM CHLORIDE CRYS ER 20 MEQ PO TBCR: 20.0000 meq | EXTENDED_RELEASE_TABLET | ORAL | 3 refills | Status: DC

## 2019-04-11 MED ORDER — TORSEMIDE 20 MG PO TABS: 100.0000 mg | ORAL_TABLET | Freq: Two times a day (BID) | ORAL | 11 refills | Status: DC

## 2019-04-11 MED ORDER — METOLAZONE 2.5 MG PO TABS: 2.5000 mg | ORAL_TABLET | ORAL | 6 refills | Status: DC

## 2019-04-11 NOTE — Progress Notes (Signed)
Referral faxed to [facility] for skilled nursing, physical therapy, unna boots, and vest protocol.  Fax # [phone]

## 2019-04-11 NOTE — Addendum Note (Signed)
Encounter addended by: Harvie Junior, CMA on: 04/11/2019 1:56 PM  Actions taken: Clinical Note Signed

## 2019-04-11 NOTE — Addendum Note (Signed)
Encounter addended by: Valeda Malm, RN on: 04/11/2019 2:08 PM  Actions taken: Clinical Note Signed, Pharmacy for encounter modified, Order list changed

## 2019-04-11 NOTE — Progress Notes (Signed)
°  ° °Heart Failure TeleHealth Note ° °Due to national recommendations of social distancing due to COVID 19, Audio/video telehealth visit is felt to be most appropriate for this patient at this time.  See MyChart message from today for patient consent regarding telehealth for [facility]. ° °Date:  04/11/2019  ° °ID:  Travis [name], DOB 01/13/1946, MRN [id number]  Location: Home  °Provider location: [facility] °Type of Visit: Established patient, Add on ° °PCP:  Ehinger, Robert, MD  °Cardiologist:  Philip Nahser, MD °Primary HF: Dr Bensimhon ° °Chief Complaint: Diastolic HF °  °History of Present Illness: °Travis [name] is a 72 y.o. 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  ° °Last seen in HF clinic 03/02/19. Volume thought to be mildly elevated. He was set up for paracentesis, but he had no peritoneal fluid. ° °He presents via audio/video conferencing for a telehealth visit today. He called HF clinic earlier today with concerns for worsening SOB and edema. He had increased torsemide to 80 mg TID and taken 5 mg metolazone on Saturday, but had minimal relief. This has been progressive over the last two weeks, more so in the last week. He has increased SOB, BLE edema into thighs, and abdominal distension. UOP has decreased on torsemide. UOP not so great with metolazone either anymore. Decreased activity due to weakness in legs. Unable to weigh. No longer has HH PT. Eating out some. Does not limit salt intake. Has limited fluid intake to less than 1.5 L the last two days. Weight 286 lbs on last Wednesday (up from 277 lbs). Has had to wear O2 over the weekend (typically only wears at night), but today O2 is 90s on RA. He is wearing TED hose. Abdomen is a little less tight. Wearing BiPAP with O2 qHS.  ° °He denies symptoms worrisome for COVID 19.  ° °Past Medical History:  °Diagnosis Date    °• Cervical myelopathy (HCC)   ° with myelomalacia at C5-6 and C3-4  °• CHF (congestive heart failure) (HCC)   °• Constipation - functional   ° controlled with miralax  °• COPD (chronic obstructive pulmonary disease) (HCC)   ° mild  °• DJD (degenerative joint disease)   ° WHOLE SPINE  °• Dyspnea   °• Gait disorder   °• Heart murmur   ° years ago   °• Hypertension   °• Hypothyroidism   ° Low d/t treatment of hyperthyroidism  °• Lumbosacral spinal stenosis 03/13/2014  °• On home oxygen therapy   ° "2.5L all the time" (06/29/2018)  °• Peripheral neuropathy   °• Pneumonia   °• PONV (postoperative nausea and vomiting)   °• RBBB   °• Renal disorder   ° acute kidney failure  °• Restless legs syndrome 03/14/2015  °• RLS (restless legs syndrome)   °• Sleep apnea   ° DX  2009/2010 study done at [facility]  °• Thoracic myelopathy   ° T1-2 and T4-5  °• Type II diabetes mellitus (HCC)   ° diet controlled  ° °Past Surgical History:  °Procedure Laterality Date  °• BACK SURGERY    ° 6 surgeries  °• CARDIAC CATHETERIZATION [name] 09/07/2015  ° Procedure: Right/Left Heart Cath and Coronary Angiography;  Surgeon: Peter M Jordan, MD;  Location: [facility];  Service: Cardiovascular;  Laterality: [name];  °• CHOLECYSTECTOMY    °• EYE SURGERY    °   BIL CATARACTS   LUMBAR LAMINECTOMY/DECOMPRESSION MICRODISCECTOMY  07/14/2012   Procedure: LUMBAR LAMINECTOMY/DECOMPRESSION MICRODISCECTOMY 2 LEVELS;  Surgeon: Eustace Moore, MD;  Location: [facility];  Service: Neurosurgery;  Laterality: Left;  Lumbar two three laminectomy, left thoracic four five transpedicular diskectomy   POSTERIOR CERVICAL FUSION/FORAMINOTOMY  04/08/2018   Procedure: Laminectomy and Foraminotomy - Lumbar Four-Lumbar Five, instrumented posterolateral fusion Lumbar Four- Five, Laminectomy and Foraminotomy - Thoracic One-Thoracic Two;  Surgeon: Eustace Moore, MD;  Location: [facility] OR;  Service: Neurosurgery;;  posterior   POSTERIOR LUMBAR FUSION  04/08/2018   Laminectomy and  Foraminotomy - Lumbar Four-Lumbar Five, instrumented posterolateral fusion Lumbar Four- Five, Laminectomy and Foraminotomy - Thoracic One-Thoracic Two   POSTERIOR LUMBAR FUSION  04/08/2018   Laminectomy and Foraminotomy - Lumbar Four-Lumbar Five, instrumented posterolateral fusion Lumbar Four- Five, Laminectomy and Foraminotomy - Thoracic One-Thoracic Two     Current Outpatient Medications  Medication Sig Dispense Refill   aspirin EC 81 MG EC tablet Take 1 tablet (81 mg total) by mouth daily. 30 tablet 0   atorvastatin (LIPITOR) 20 MG tablet Take 1 tablet (20 mg total) by mouth daily. 90 tablet 3   Blood Glucose Monitoring Suppl (ONETOUCH VERIO) w/Device KIT 1 kit by Does not apply route 4 (four) times daily. Use as directed to test blood sugars 4 times daily DX E11.42 1 kit 0   Continuous Blood Gluc Receiver (FREESTYLE LIBRE 14 DAY READER) DEVI 1 kit by Does not apply route 3 (three) times daily. (Patient not taking: Reported on 03/04/2019) 1 Device 0   Continuous Blood Gluc Sensor (FREESTYLE LIBRE 14 DAY SENSOR) MISC 1 kit by Does not apply route 3 (three) times daily. (Patient not taking: Reported on 03/04/2019) 3 each 11   docusate sodium (COLACE) 100 MG capsule Take 200 mg by mouth at bedtime.     escitalopram (LEXAPRO) 20 MG tablet Take 20 mg by mouth daily.     gabapentin (NEURONTIN) 400 MG capsule TAKE 1 CAPSULE (400 MG TOTAL) BY MOUTH EVERY 8 (EIGHT) HOURS. (Patient taking differently: Take 400 mg by mouth 3 (three) times daily. ) 90 capsule 0   glucose blood test strip Use as instructed to test blood sugars 4 times daily DX E11.42 100 each 12   hydrALAZINE (APRESOLINE) 50 MG tablet Take 1.5 tablets (75 mg total) by mouth 3 (three) times daily. 378 tablet 3   insulin aspart (NOVOLOG FLEXPEN) 100 UNIT/ML FlexPen Inject 18 Units into the skin 3 (three) times daily with meals. 15 mL 11   Insulin Glargine (LANTUS SOLOSTAR) 100 UNIT/ML Solostar Pen Inject 60 Units into the skin at  bedtime. 15 mL 11   Insulin Pen Needle (BD PEN NEEDLE MICRO U/F) 32G X 6 MM MISC Four times daily 150 each 6   Insulin Pen Needle (BD PEN NEEDLE MICRO U/F) 32G X 6 MM MISC 4 times a day 150 each 11   ketoconazole (NIZORAL) 2 % cream Apply 1 application topically daily. 120 g 0   Lancets (ONETOUCH ULTRASOFT) lancets Use as instructed to test blood sugars 4 times daily DX E11.42 100 each 12   levothyroxine (SYNTHROID, LEVOTHROID) 175 MCG tablet Take 1 tablet (175 mcg total) by mouth daily. 30 tablet 2   metolazone (ZAROXOLYN) 2.5 MG tablet Take 1 tablet (2.5 mg total) by mouth once a week. Fridays 4 tablet 6   metoprolol tartrate (LOPRESSOR) 25 MG tablet Take 0.5 tablets (12.5 mg total) by mouth daily. 45 tablet 3   multivitamin (  ONE-A-DAY MEN'S) TABS tablet Take 1 tablet by mouth at bedtime.    °• polyethylene glycol (MIRALAX / GLYCOLAX) packet Take 17 g by mouth daily as needed. For constipation (Patient taking differently: Take 17 g by mouth at bedtime. ) 14 each 0  °• pramipexole (MIRAPEX) 0.5 MG tablet One tablet in the morning and 2 in the evening 270 tablet 1  °• spironolactone (ALDACTONE) 25 MG tablet Take 1 tablet (25 mg total) by mouth daily. 90 tablet 3  °• tamsulosin (FLOMAX) 0.4 MG CAPS capsule Take 1 capsule (0.4 mg total) by mouth daily. (Patient taking differently: Take 0.4 mg by mouth at bedtime. ) 30 capsule 0  °• torsemide (DEMADEX) 20 MG tablet Take 4 tablets (80 mg total) by mouth 2 (two) times daily. 240 tablet 11  °• TOVIAZ 8 MG TB24 tablet Take 1 tablet (8 mg total) by mouth daily. 30 tablet 11  ° °No current facility-administered medications for this encounter.   ° ° °Allergies:   Amlodipine; Plendil [felodipine]; Compazine [prochlorperazine]; Cymbalta [duloxetine hcl]; Jardiance [empagliflozin]; Other; Levofloxacin in d5w; and Septra [sulfamethoxazole-trimethoprim]  ° °Social History:  The patient  reports that he quit smoking about 33 years ago. His smoking use included  cigarettes. He has a 40.00 pack-year smoking history. He quit smokeless tobacco use about 33 years ago. He reports that he does not drink alcohol or use drugs.  ° °Family History:  The patient's family history includes CAD in his unknown relative; Diabetes in his father; Heart disease in his father; Hypertension in his father; Polycythemia in his mother.  ° °ROS:  Please see the history of present illness.   All other systems are personally reviewed and negative.  ° °Exam:  (Video/Tele Health Call; Exam is subjective and or/visual.) °General:  Speaks in full sentences. No resp difficulty. °Lungs: Normal respiratory effort with conversation.  °Abdomen: +distended per patient report °Extremities: + BLE edema into thighs. Has TED hose on.  °Neuro: Alert & oriented x 3.  ° °Recent Labs: °06/29/2018: TSH 1.171 °07/04/2018: Magnesium 2.2 °01/05/2019: ALT 20 °03/02/2019: B Natriuretic Peptide 44.4; BUN 61; Creatinine, Ser 1.90; Hemoglobin 13.1; Platelets 211; Potassium 3.5; Sodium 133  °Personally reviewed  ° °Wt Readings from Last 3 Encounters:  °03/04/19 126.1 kg (278 lb)  °03/02/19 125.8 kg (277 lb 6.4 oz)  °02/04/19 125.6 kg (277 lb)  °  ° ° °ASSESSMENT AND PLAN: ° °1. Acute on chronic Diastolic Heart Failure with R>>L symptoms °- ECHO 06/29/2018 EF 55-60% Grade IDD  °- Suspect due to R-sided HF from OSA, OHS and COPD °- Myeloma panel with no M-spike, normal IFE.  °- NYHA III-IIIb °- Volume sounds elevated. Weight is up at least 10 lbs from clinic weight, but he has not weighed since Wednesday. He had mild improvement with additional metolazone on Saturday.  °- Increase torsemide to 100 mg BID °- Continue metolazone 2.5 mg every Friday and PRN. Will have him take additional metolazone 2.5 mg today and tomorrow. He should take 20 meq K when he takes metolazone.  °- Continue spiro 25 daily °- Intolerant to Jardiance with UTI and rash. 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 Reviewed by Dr. Bensimhon and determined to be  negative.  °- Discussed limiting fluid and salt intake °- Refer to [facility] RN. Will start vest protocol and unna boots. He may need IV lasix if he does not improve with metolazone.  °  °2.Chronic Hypoxic Respiratory Failure/COPD °- Continue 3L O2 qHS. °-   May wear PRN with exertion.  °  °3. CKD Stage III °- Creatinine baseline ~1.90  °- Creatinine stable 1.90 on on 3/4, K 3.5 °  °4. OSA °- Continue nightly BiPAP.  °- Follows with Dr. Sood.  °  °5. CAD °- Non-obstructive. LHC 08/2015: LM 40, LAD distal 40, LCx ostial 50, RCA proximal 40 °- No s/s ischemia °- On ASA, lopressor 12.5 mg BID °- Continue statin.  ° °6. Deconditioning °- Legs are too weak to weigh. He was doing well until HH PT stopped.  °- Refer back to HH PT. Will use [facility] so that he can get IV lasix if needed.  °  ° ° °COVID screen °The patient does not have any symptoms that suggest any further testing/ screening at this time.  Social distancing reinforced today. ° °Relevant cardiac medications were reviewed at length with the patient today.   °The patient does not have concerns regarding their medications at this time.  ° °The following changes were made today: Increase torsemide to 100 mg BID. Take metolazone 2.5 mg with 20 meq K today and tomorrow. Refer to [facility] RN for vest protocol, unna boots, and PT for weakness. Will request BMET later this week. Provide refills for metolazone and torsemide.  ° °Recommended follow-up: Telephone on Thursday with me. He will call us if he does not improve with med changes.  ° °Today, I have spent 16 minutes with the patient with telehealth technology discussing the above issues .   ° °Signed, ° M , NP  °04/11/2019 °12:30 PM ° °[facility] °[place] °[address] °[facility] ° [place] [address] °[phone] (office) °[phone] (fax) ° °

## 2019-04-11 NOTE — Patient Instructions (Addendum)
1. Take metolazone 2.5 mg with 20 meq K today and tomorrow.  You should take 20 meq K (1 tab) on all days that he takes metolazone   2. Increase torsemide to 100 mg twice a day.   3. You have been referred to [facility] for physical therapy and blood work.  4. Refills: torsemide, metolazone, potassium sent to pharmacy on [facility].   5. Follow up on 04/14/19 (Thursday) by telephone with Caryl Pina 2:30p

## 2019-04-11 NOTE — Telephone Encounter (Signed)
Patient called with concerns regarding increased SOB and LE edema. Unable to weigh at home as he is unsteady on his feet. Increased home o2 usage. +BLE edema Was advised to increase torsemide to 80 mg TID  and 5 mg of metolazone on Saturday by on call provider with minimal relief   Pt reports he still feels SOB advised at this time we could conduct a virtual visit to further assess.request telephone only.

## 2019-04-11 NOTE — Addendum Note (Signed)
Encounter addended by: Harvie Junior, CMA on: 04/11/2019 1:49 PM  Actions taken: Visit diagnoses modified, Order list changed, Diagnosis association updated

## 2019-04-11 NOTE — Progress Notes (Signed)
AVS discussed with wife.  Medication instructions reviewed and refills sent to pharmacy.   All questions answered. Verbalized understanding.

## 2019-04-14 ENCOUNTER — Other Ambulatory Visit: Payer: Self-pay

## 2019-04-14 ENCOUNTER — Ambulatory Visit (HOSPITAL_COMMUNITY)
Admission: RE | Admit: 2019-04-14 | Discharge: 2019-04-14 | Disposition: A | Discharge status: Home / Self Care | Payer: PPO | Source: Ambulatory Visit | Attending: Cardiology | Admitting: Cardiology

## 2019-04-14 ENCOUNTER — Telehealth: Payer: Self-pay

## 2019-04-14 DIAGNOSIS — N183 Chronic kidney disease, stage 3 unspecified: Secondary | ICD-10-CM

## 2019-04-14 DIAGNOSIS — I2510 Atherosclerotic heart disease of native coronary artery without angina pectoris: Secondary | ICD-10-CM

## 2019-04-14 DIAGNOSIS — R5381 Other malaise: Secondary | ICD-10-CM

## 2019-04-14 DIAGNOSIS — G4733 Obstructive sleep apnea (adult) (pediatric): Secondary | ICD-10-CM

## 2019-04-14 DIAGNOSIS — I5032 Chronic diastolic (congestive) heart failure: Secondary | ICD-10-CM

## 2019-04-14 DIAGNOSIS — J9611 Chronic respiratory failure with hypoxia: Secondary | ICD-10-CM

## 2019-04-14 NOTE — Progress Notes (Signed)
Spoke with wife.  Discussed visit summary with her. [place] with come out to home for PT, blood work and Haematologist referral.  Contacted [facility] RN Vanita Ingles regarding doing a vest at home.  She will reach out to patient/wife to make an appointment. AVS mailed.

## 2019-04-14 NOTE — Patient Instructions (Signed)
You have been referred to [facility] for blood work and physical therapy.  They will be coming out to your home on 04/15/2019.  Your physician recommends that you schedule a follow-up appointment in: 3 weeks with Nurse Practitioner.

## 2019-04-14 NOTE — Progress Notes (Signed)
Heart Failure TeleHealth Note  Due to national recommendations of social distancing due to [place] 19, Audio/video telehealth visit is felt to be most appropriate for this patient at this time.  See MyChart message from today for patient consent regarding telehealth for [facility].  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   Last seen in HF clinic 03/02/19. Volume thought to be mildly elevated. He was set up for paracentesis, but he had no peritoneal fluid.  He had a virtual visit with me earlier this week. He was volume overload and instructed to take metolazone for 2 days in a row and increase torsemide to 100 mg BID. He was also referred back to [facility] for vest protocol, unna boots, and PT.   He presents via Engineer, civil (consulting) for a telehealth visit today. Overall doing much better. No longer bloated and legs aren't swollen. Urinated a lot with metolazone and is now urinating better with torsemide. SOB has improved, but is not at baseline. He is still SOB on any exertion. No CP or dizziness. No cough, fever, or chills. Unable to weigh because legs were weak. Wearing O2 qHS with BiPAP. Taking all medications. Limiting fluid and salt intake better now. Has not heard from [facility] yet.  He denies symptoms worrisome for COVID 19.   Past Medical History:  Diagnosis Date  . Cervical myelopathy (HCC)    with myelomalacia at C5-6 and C3-4  . CHF (congestive heart failure) ([place])   . Constipation - functional    controlled  with miralax  . COPD (chronic obstructive pulmonary disease) (HCC)    mild  . DJD (degenerative joint disease)    WHOLE SPINE  . Dyspnea   . Gait disorder   . Heart murmur    years ago   . Hypertension   . Hypothyroidism    Low d/t treatment of hyperthyroidism  . Lumbosacral spinal stenosis 03/13/2014  . On home oxygen therapy    "2.5L all the time" (06/29/2018)  . Peripheral neuropathy   . Pneumonia   . PONV (postoperative nausea and vomiting)   . RBBB   . Renal disorder    acute kidney failure  . Restless legs syndrome 03/14/2015  . RLS (restless legs syndrome)   . Sleep apnea    DX  2009/2010 study done at [facility]  . 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 Type II diabetes mellitus ([place])    diet controlled   Past Surgical History:  Procedure Laterality Date  . BACK SURGERY     6 surgeries  . CARDIAC CATHETERIZATION [name] 09/07/2015   Procedure: Right/Left Heart Cath and Coronary Angiography;  Surgeon: Peter M Martinique, MD;  Location: [place] [facility];  Service: Cardiovascular;  Laterality: [name];  . CHOLECYSTECTOMY    . EYE SURGERY     BIL CATARACTS  . LUMBAR LAMINECTOMY/DECOMPRESSION MICRODISCECTOMY  07/14/2012   Procedure: LUMBAR LAMINECTOMY/DECOMPRESSION MICRODISCECTOMY 2 LEVELS;  Surgeon: Eustace Moore, MD;  Location: [place] NEURO ORS;  Service: Neurosurgery;  Laterality: Left;  Lumbar two three laminectomy, left thoracic four five transpedicular diskectomy  . POSTERIOR CERVICAL FUSION/FORAMINOTOMY  04/08/2018   Procedure: Laminectomy and Foraminotomy - Lumbar Four-Lumbar Five, instrumented posterolateral fusion Lumbar Four- Five, Laminectomy and Foraminotomy - Thoracic One-Thoracic Two;  Surgeon: Eustace Moore, MD;  Location: [facility] OR;  Service: Neurosurgery;;  posterior  . POSTERIOR LUMBAR FUSION  04/08/2018   Laminectomy and Foraminotomy - Lumbar Four-Lumbar Five, instrumented posterolateral fusion Lumbar Four- Five, Laminectomy and Foraminotomy - Thoracic One-Thoracic Two  .  POSTERIOR LUMBAR FUSION  04/08/2018   Laminectomy and Foraminotomy - Lumbar Four-Lumbar Five, instrumented posterolateral fusion Lumbar Four- Five, Laminectomy and Foraminotomy - Thoracic One-Thoracic Two     Current Outpatient Medications  Medication Sig Dispense Refill  . aspirin EC 81 MG EC tablet Take 1 tablet (81 mg total) by mouth daily. 30 tablet 0  . atorvastatin (LIPITOR) 20 MG tablet Take 1 tablet (20 mg total) by mouth daily. 90 tablet 3  . hydrALAZINE (APRESOLINE) 50 MG tablet Take 1.5 tablets (75 mg total) by mouth 3 (three) times daily. 378 tablet 3  . metolazone (ZAROXOLYN) 2.5 MG tablet Take 1 tablet (2.5 mg total) by mouth once a week. Fridays and additional as directed by heart failure clinic 10 tablet 6  . metoprolol tartrate (LOPRESSOR) 25 MG tablet Take 0.5 tablets (12.5 mg total) by mouth daily. (Patient taking differently: Take 12.5 mg by mouth 2 (two) times daily. ) 45 tablet 3  . potassium chloride SA (K-DUR,KLOR-CON) 20 MEQ tablet Take 1 tablet (20 mEq total) by mouth as directed. Take 1 tab only on days you take Metolazone 10 tablet 3  . spironolactone (ALDACTONE) 25 MG tablet Take 1 tablet (25 mg total) by mouth daily. 90 tablet 3  . torsemide (DEMADEX) 20 MG tablet Take 5 tablets (100 mg total) by mouth 2 (two) times daily. 300 tablet 11  . Blood Glucose Monitoring Suppl (ONETOUCH VERIO) w/Device KIT 1 kit by Does not apply route 4 (four) times daily. Use as directed to test blood sugars 4 times daily DX E11.42 1 kit 0  . Continuous Blood Gluc Receiver (FREESTYLE LIBRE 14 DAY READER) DEVI 1 kit by Does not apply route 3 (three) times daily. (Patient not taking: Reported on 03/04/2019) 1 Device 0  . Continuous Blood Gluc Sensor (FREESTYLE LIBRE 14 DAY SENSOR) MISC 1 kit by Does not apply route 3 (three) times daily. (Patient not taking: Reported on 03/04/2019) 3 each 11  . docusate sodium (COLACE) 100 MG capsule Take 200 mg by mouth at bedtime.    [name] escitalopram (LEXAPRO)  20 MG tablet Take 20 mg by mouth daily.    [name] gabapentin (NEURONTIN) 400 MG capsule TAKE 1 CAPSULE (400 MG TOTAL) BY MOUTH EVERY 8 (EIGHT) HOURS. (Patient taking differently: Take 400 mg by mouth 3 (three) times daily. ) 90 capsule 0  . glucose blood test strip Use as instructed to test blood sugars 4 times daily DX E11.42 100 each 12  . insulin aspart (NOVOLOG FLEXPEN) 100 UNIT/ML FlexPen Inject 18 Units into the skin 3 (three) times daily with meals. 15 mL 11  . Insulin Glargine (LANTUS SOLOSTAR) 100 UNIT/ML Solostar Pen Inject 60 Units into the skin at bedtime. 15 mL 11  . Insulin Pen Needle (BD PEN NEEDLE MICRO U/F) 32G X 6 MM MISC Four times daily 150 each 6  . Insulin Pen Needle (BD PEN NEEDLE MICRO U/F) 32G X 6 MM MISC 4 times a day 150 each 11  .  ketoconazole (NIZORAL) 2 % cream Apply 1 application topically daily. 120 g 0  . Lancets (ONETOUCH ULTRASOFT) lancets Use as instructed to test blood sugars 4 times daily DX E11.42 100 each 12  . levothyroxine (SYNTHROID, LEVOTHROID) 175 MCG tablet Take 1 tablet (175 mcg total) by mouth daily. 30 tablet 2  . multivitamin (ONE-A-DAY MEN'S) TABS tablet Take 1 tablet by mouth at bedtime.    . polyethylene glycol (MIRALAX / GLYCOLAX) packet Take 17 g by mouth daily as needed. For constipation (Patient taking differently: Take 17 g by mouth at bedtime. ) 14 each 0  . pramipexole (MIRAPEX) 0.5 MG tablet One tablet in the morning and 2 in the evening 270 tablet 1  . tamsulosin (FLOMAX) 0.4 MG CAPS capsule Take 1 capsule (0.4 mg total) by mouth daily. (Patient taking differently: Take 0.4 mg by mouth at bedtime. ) 30 capsule 0  . TOVIAZ 8 MG TB24 tablet Take 1 tablet (8 mg total) by mouth daily. 30 tablet 11   No current facility-administered medications for this encounter.     Allergies:   Amlodipine; Plendil [felodipine]; Compazine [prochlorperazine]; Cymbalta [duloxetine hcl]; Jardiance [empagliflozin]; Other; Levofloxacin in d5w; and Septra  [sulfamethoxazole-trimethoprim]   Social History:  The patient  reports that he quit smoking about 33 years ago. His smoking use included cigarettes. He has a 40.00 pack-year smoking history. He quit smokeless tobacco use about 33 years ago. He reports that he does not drink alcohol or use drugs.   Family History:  The patient's family history includes CAD in his unknown relative; Diabetes in his father; Heart disease in his father; Hypertension in his father; Polycythemia in his mother.   ROS:  Please see the history of present illness.   All other systems are personally reviewed and negative.   Exam:  (Video/Tele Health Call; Exam is subjective and or/visual.) General:  Speaks in full sentences. No resp difficulty. Lungs: Normal respiratory effort with conversation.  Abdomen: Non-distended per patient report Extremities: Pt denies edema. Neuro: Alert & oriented x 3.   Recent Labs: 06/29/2018: TSH 1.171 07/04/2018: Magnesium 2.2 01/05/2019: ALT 20 03/02/2019: B Natriuretic Peptide 44.4; BUN 61; Creatinine, Ser 1.90; Hemoglobin 13.1; Platelets 211; Potassium 3.5; Sodium 133  Personally reviewed   Wt Readings from Last 3 Encounters:  03/04/19 126.1 kg (278 lb)  03/02/19 125.8 kg (277 lb 6.4 oz)  02/04/19 125.6 kg (277 lb)      ASSESSMENT AND PLAN:  1. Chronic Diastolic Heart Failure with R>>L symptoms - ECHO 06/29/2018 EF 55-60% Grade IDD  - Suspect due to R-sided HF from OSA, OHS and COPD - PYP scan 10/2018 with Grade 1, H/CLL 1.45. Reviewed by Dr. Mauricia Area determined to be negative - Myeloma panel with no M-spike, normal IFE. - NYHAIII-IIIb - Volume sounds much improved. - Continue torsemide 100 mg BID. Check BMET - Continue metolazone 2.5 mg every Friday and PRN. He should take 20 meq K when he takes metolazone.  -Continue spiro 25 daily - Intolerant to [place] with UTI and rash. - Discussed limiting fluid and salt intake. He is doing much better with this.  - Refer to  [facility] RN for vest protocol  2.Chronic Hypoxic Respiratory Failure/COPD - Continue 3L O2 qHS. No change.   3. CKD Stage III - Creatinine baseline ~1.90 - Creatinine stable 1.90 on on 3/4, K 3.5 - Need repeat BMET with increase in diuretics.   4. OSA - Continue nightly BiPAP. No change. - Follows with Dr. Halford Chessman.  5. CAD - Non-obstructive. LHC 08/2015: LM 40, LAD distal 40, LCx ostial 50, RCA proximal 40 -No s/s ischemia.  - On ASA, lopressor 12.5 mg BID - Continue statin.   6. Deconditioning - Legs are too weak to weigh. He was doing well until [facility] PT stopped.  - Referred back to [facility] PT earlier this week   COVID screen The patient does not have any symptoms that suggest any further testing/ screening at this time.  Social distancing reinforced today.  The following changes were made today: Needs BMET either though [facility] or in HF clinic. Check on [facility] referral  Recommended follow-up: 3 weeks by telephone  Today, I have spent 8 minutes with the patient with telehealth technology discussing the above issues .    Signed, Georgiana Shore, NP  04/14/2019 2:25 PM  [place] [address] [facility] [place] [address] [phone] (office) [phone] (fax)

## 2019-04-14 NOTE — Telephone Encounter (Signed)
Spoke to Ms. Mccarter regarding referral to do a ReDS reading.  She states he is a whole lot better today that he was yesterday and the day before after the medication changes that were made by his doctor.  She sates he has a phone appt with his pulmonologist tomorrow at 10am but is free afterwards.  Agreed on an 11am home visit for the ReDS reading and vital signs to be done unless [facility] calls and needs that appointment time, in which she will call me and we will schedule a different time.

## 2019-04-14 NOTE — Addendum Note (Signed)
Encounter addended by: Valeda Malm, RN on: 04/14/2019 3:02 PM  Actions taken: Order list changed, Diagnosis association updated, Clinical Note Signed

## 2019-04-15 ENCOUNTER — Ambulatory Visit (INDEPENDENT_AMBULATORY_CARE_PROVIDER_SITE_OTHER): Payer: PPO | Admitting: Internal Medicine

## 2019-04-15 ENCOUNTER — Encounter: Payer: Self-pay | Admitting: Internal Medicine

## 2019-04-15 ENCOUNTER — Other Ambulatory Visit: Payer: Self-pay

## 2019-04-15 ENCOUNTER — Telehealth (HOSPITAL_COMMUNITY): Payer: Self-pay | Admitting: *Deleted

## 2019-04-15 DIAGNOSIS — E1142 Type 2 diabetes mellitus with diabetic polyneuropathy: Secondary | ICD-10-CM

## 2019-04-15 DIAGNOSIS — I2510 Atherosclerotic heart disease of native coronary artery without angina pectoris: Secondary | ICD-10-CM | POA: Diagnosis not present

## 2019-04-15 DIAGNOSIS — IMO0001 Reserved for inherently not codable concepts without codable children: Secondary | ICD-10-CM | POA: Insufficient documentation

## 2019-04-15 DIAGNOSIS — N183 Chronic kidney disease, stage 3 unspecified: Secondary | ICD-10-CM

## 2019-04-15 DIAGNOSIS — E1122 Type 2 diabetes mellitus with diabetic chronic kidney disease: Secondary | ICD-10-CM

## 2019-04-15 DIAGNOSIS — I130 Hypertensive heart and chronic kidney disease with heart failure and stage 1 through stage 4 chronic kidney disease, or unspecified chronic kidney disease: Secondary | ICD-10-CM | POA: Diagnosis not present

## 2019-04-15 DIAGNOSIS — E1165 Type 2 diabetes mellitus with hyperglycemia: Secondary | ICD-10-CM | POA: Diagnosis not present

## 2019-04-15 DIAGNOSIS — I4510 Unspecified right bundle-branch block: Secondary | ICD-10-CM | POA: Diagnosis not present

## 2019-04-15 DIAGNOSIS — M519 Unspecified thoracic, thoracolumbar and lumbosacral intervertebral disc disorder: Secondary | ICD-10-CM | POA: Diagnosis not present

## 2019-04-15 DIAGNOSIS — M4807 Spinal stenosis, lumbosacral region: Secondary | ICD-10-CM | POA: Diagnosis not present

## 2019-04-15 DIAGNOSIS — G4733 Obstructive sleep apnea (adult) (pediatric): Secondary | ICD-10-CM | POA: Diagnosis not present

## 2019-04-15 DIAGNOSIS — I5033 Acute on chronic diastolic (congestive) heart failure: Secondary | ICD-10-CM | POA: Diagnosis not present

## 2019-04-15 DIAGNOSIS — G2581 Restless legs syndrome: Secondary | ICD-10-CM | POA: Diagnosis not present

## 2019-04-15 DIAGNOSIS — E1140 Type 2 diabetes mellitus with diabetic neuropathy, unspecified: Secondary | ICD-10-CM | POA: Diagnosis not present

## 2019-04-15 DIAGNOSIS — Z794 Long term (current) use of insulin: Secondary | ICD-10-CM

## 2019-04-15 DIAGNOSIS — M50021 Cervical disc disorder at C4-C5 level with myelopathy: Secondary | ICD-10-CM | POA: Diagnosis not present

## 2019-04-15 DIAGNOSIS — Z9981 Dependence on supplemental oxygen: Secondary | ICD-10-CM | POA: Diagnosis not present

## 2019-04-15 DIAGNOSIS — E039 Hypothyroidism, unspecified: Secondary | ICD-10-CM | POA: Diagnosis not present

## 2019-04-15 DIAGNOSIS — E119 Type 2 diabetes mellitus without complications: Secondary | ICD-10-CM

## 2019-04-15 DIAGNOSIS — J449 Chronic obstructive pulmonary disease, unspecified: Secondary | ICD-10-CM | POA: Diagnosis not present

## 2019-04-15 DIAGNOSIS — Z87891 Personal history of nicotine dependence: Secondary | ICD-10-CM | POA: Diagnosis not present

## 2019-04-15 MED ORDER — INSULIN DEGLUDEC 100 UNIT/ML ~~LOC~~ SOPN: 60.0000 [IU] | PEN_INJECTOR | Freq: Every day | SUBCUTANEOUS | 11 refills | Status: DC

## 2019-04-15 NOTE — Progress Notes (Signed)
Virtual Visit via Video Note  I connected with Travis [name] on 04/15/19 at 9:10 AM  by a video enabled telemedicine application and verified that I am speaking with the correct person using two identifiers.   I discussed the limitations of evaluation and management by telemedicine and the availability of in person appointments. The [name] expressed understanding and agreed to proceed.  -Location of the [name] : Home  -Location of the provider : Office  -The names of all persons participating in the telemedicine service : CMA Lolita Rieger , Mrs. Authement             Name: Travis [name]  Age/ Sex: 73 y.o., male   MRN/ DOB: [id number], 29-Dec-1946     PCP: [name], MD   Reason for Endocrinology Evaluation: Type 2 Diabetes Mellitus  Initial Endocrine Consultative Visit: 12/31/2018    [name] IDENTIFIER: Travis [name] is a 73 y.o. male with a past medical history of OSA, RLS, T7SV, diastolic CHF and postablative hypothyroidism.  The [name] has followed with Endocrinology clinic since 12/31/2018 for consultative assistance with management of his diabetes.  DIABETIC HISTORY:  Travis [name] was diagnosed with T2DM in 2017, he has been on ozempic and glipizide with persistent hyperglycemia,Jardiance caused recurrent genital infections,  insulin was started in December, 2019. His hemoglobin A1c has ranged from 6.7% in 2016, peaking at 11.3%  In 11/21/18   SUBJECTIVE:   During the last visit (03/04/2019): We increased lantus  and novolog .   Today (04/15/2019): Travis [name] is here for one-month virtual follow-up on diabetes management. He checks his blood sugars 1 times daily, fasting. The [name] has not had hypoglycemic episodes since the last clinic visit.  Otherwise, the [name] has not required any recent emergency interventions for hypoglycemia and has not had recent hospitalizations secondary to hyper or hypoglycemic episodes.     ROS: As per HPI and as detailed  below: Review of Systems  Constitutional: Negative for fever and weight loss.  HENT: Negative for congestion and sore throat.   Respiratory: Negative for cough and shortness of breath.   Cardiovascular: Negative for chest pain, palpitations and leg swelling.  Genitourinary: Positive for frequency.  Endo/Heme/Allergies: Negative for polydipsia.      HOME DIABETES REGIMEN:  Lantus 48 units daily  Novolog 14 units TIDQAC   GLUCOSE LOG : Date  Breakfast   04/15/2019 144  4/16 158  4/15 165  4/14 168  4/13 162    HISTORY:  Past Medical History:  Past Medical History:  Diagnosis Date  . Cervical myelopathy (HCC)    with myelomalacia at C5-6 and C3-4  . CHF (congestive heart failure) ([place])   . Constipation - functional    controlled with miralax  . COPD (chronic obstructive pulmonary disease) (HCC)    mild  . DJD (degenerative joint disease)    WHOLE SPINE  . Dyspnea   . Gait disorder   . Heart murmur    years ago   . Hypertension   . Hypothyroidism    Low d/t treatment of hyperthyroidism  . Lumbosacral spinal stenosis 03/13/2014  . On home oxygen therapy    "2.5L all the time" (06/29/2018)  . Peripheral neuropathy   . Pneumonia   . PONV (postoperative nausea and vomiting)   . RBBB   . Renal disorder    acute kidney failure  . Restless legs syndrome 03/14/2015  . RLS (restless legs syndrome)   . Sleep apnea  DX  2009/2010 study done at [facility]  . 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 Type II diabetes mellitus ([place])    diet controlled   Past Surgical History:  Past Surgical History:  Procedure Laterality Date  . BACK SURGERY     6 surgeries  . CARDIAC CATHETERIZATION [name] 09/07/2015   Procedure: Right/Left Heart Cath and Coronary Angiography;  Surgeon: Peter M Martinique, MD;  Location: [facility];  Service: Cardiovascular;  Laterality: [name];  . CHOLECYSTECTOMY    . EYE SURGERY     BIL CATARACTS  . LUMBAR LAMINECTOMY/DECOMPRESSION MICRODISCECTOMY   07/14/2012   Procedure: LUMBAR LAMINECTOMY/DECOMPRESSION MICRODISCECTOMY 2 LEVELS;  Surgeon: Eustace Moore, MD;  Location: [facility];  Service: Neurosurgery;  Laterality: Left;  Lumbar two three laminectomy, left thoracic four five transpedicular diskectomy  . POSTERIOR CERVICAL FUSION/FORAMINOTOMY  04/08/2018   Procedure: Laminectomy and Foraminotomy - Lumbar Four-Lumbar Five, instrumented posterolateral fusion Lumbar Four- Five, Laminectomy and Foraminotomy - Thoracic One-Thoracic Two;  Surgeon: Eustace Moore, MD;  Location: [facility];  Service: Neurosurgery;;  posterior  . POSTERIOR LUMBAR FUSION  04/08/2018   Laminectomy and Foraminotomy - Lumbar Four-Lumbar Five, instrumented posterolateral fusion Lumbar Four- Five, Laminectomy and Foraminotomy - Thoracic One-Thoracic Two  . POSTERIOR LUMBAR FUSION  04/08/2018   Laminectomy and Foraminotomy - Lumbar Four-Lumbar Five, instrumented posterolateral fusion Lumbar Four- Five, Laminectomy and Foraminotomy - Thoracic One-Thoracic Two    Social History:  reports that he quit smoking about 33 years ago. His smoking use included cigarettes. He has a 40.00 pack-year smoking history. He quit smokeless tobacco use about 33 years ago. He reports that he does not drink alcohol or use drugs. Family History:  Family History  Problem Relation Age of Onset  . Polycythemia Mother   . Diabetes Father   . Hypertension Father   . Heart disease Father   . CAD Unknown        1 of his 4 brothers and father has CAD     HOME MEDICATIONS: Allergies as of 04/15/2019      Reactions   Amlodipine Swelling, Other (See Comments)   Causes edema (per [facility] paperwork)   Plendil [felodipine] Swelling, Other (See Comments)   Causes edema (per [facility] paperwork)   Compazine [prochlorperazine] Other (See Comments)   Caused agitation (per [facility] paperwork)   Cymbalta [duloxetine Hcl] Other (See Comments)   Made the [name] "feel weird," per  [facility] paperwork   Jardiance [empagliflozin] Other (See Comments)   Stopped by MD because of unwanted side effects   Other Nausea Only, Other (See Comments)   ANESTHESIA UNSPECIFIED- makes sick, must have anti-nausea medication in advance   Levofloxacin In D5w Itching   Possible infusion reaction 03/29/17. Tolerating oral Levaquin 03/30/17   Septra [sulfamethoxazole-trimethoprim] Nausea Only, Other (See Comments)   Per [facility] paperwork      Medication List       Accurate as of April 15, 2019  7:55 AM. Always use your most recent med list.        aspirin 81 MG EC tablet Take 1 tablet (81 mg total) by mouth daily.   atorvastatin 20 MG tablet Commonly known as:  LIPITOR Take 1 tablet (20 mg total) by mouth daily.   docusate sodium 100 MG capsule Commonly known as:  COLACE Take 200 mg by mouth at bedtime.   escitalopram 20 MG tablet Commonly known as:  LEXAPRO Take 20 mg by  mouth daily.   FreeStyle Libre 14 Day Reader Kerrin Mo 1 kit by Does not apply route 3 (three) times daily.   FreeStyle Libre 14 Day Sensor Misc 1 kit by Does not apply route 3 (three) times daily.   gabapentin 400 MG capsule Commonly known as:  NEURONTIN TAKE 1 CAPSULE (400 MG TOTAL) BY MOUTH EVERY 8 (EIGHT) HOURS.   glucose blood test strip Use as instructed to test blood sugars 4 times daily DX E11.42   hydrALAZINE 50 MG tablet Commonly known as:  APRESOLINE Take 1.5 tablets (75 mg total) by mouth 3 (three) times daily.   insulin aspart 100 UNIT/ML FlexPen Commonly known as:  NovoLOG FlexPen Inject 18 Units into the skin 3 (three) times daily with meals.   Insulin Glargine 100 UNIT/ML Solostar Pen Commonly known as:  Lantus SoloStar Inject 60 Units into the skin at bedtime.   Insulin Pen Needle 32G X 6 MM Misc Commonly known as:  BD Pen Needle Micro U/F Four times daily   Insulin Pen Needle 32G X 6 MM Misc Commonly known as:  BD Pen Needle Micro U/F 4 times a day    ketoconazole 2 % cream Commonly known as:  NIZORAL Apply 1 application topically daily.   levothyroxine 175 MCG tablet Commonly known as:  SYNTHROID Take 1 tablet (175 mcg total) by mouth daily.   metolazone 2.5 MG tablet Commonly known as:  ZAROXOLYN Take 1 tablet (2.5 mg total) by mouth once a week. Fridays and additional as directed by heart failure clinic   metoprolol tartrate 25 MG tablet Commonly known as:  LOPRESSOR Take 0.5 tablets (12.5 mg total) by mouth daily.   multivitamin Tabs tablet Take 1 tablet by mouth at bedtime.   onetouch ultrasoft lancets Use as instructed to test blood sugars 4 times daily DX E11.42   OneTouch Verio w/Device Kit 1 kit by Does not apply route 4 (four) times daily. Use as directed to test blood sugars 4 times daily DX E11.42   polyethylene glycol 17 g packet Commonly known as:  MIRALAX / GLYCOLAX Take 17 g by mouth daily as needed. For constipation   potassium chloride SA 20 MEQ tablet Commonly known as:  K-DUR Take 1 tablet (20 mEq total) by mouth as directed. Take 1 tab only on days you take Metolazone   pramipexole 0.5 MG tablet Commonly known as:  MIRAPEX One tablet in the morning and 2 in the evening   spironolactone 25 MG tablet Commonly known as:  ALDACTONE Take 1 tablet (25 mg total) by mouth daily.   tamsulosin 0.4 MG Caps capsule Commonly known as:  FLOMAX Take 1 capsule (0.4 mg total) by mouth daily.   torsemide 20 MG tablet Commonly known as:  DEMADEX Take 5 tablets (100 mg total) by mouth 2 (two) times daily.   Toviaz 8 MG Tb24 tablet Generic drug:  fesoterodine Take 1 tablet (8 mg total) by mouth daily.        DATA REVIEWED:  Lab Results  Component Value Date   HGBA1C 13.3 (A) 02/04/2019   HGBA1C 11.2 (H) 11/22/2018   HGBA1C 11.3 (H) 11/21/2018      ASSESSMENT / PLAN / RECOMMENDATIONS:   1) Type 2 Diabetes Mellitus, poorly controlled, With neuropathic and renal (CKD III) complications - Most  recent A1c of 13.3 %. Goal A1c < 7.0 %.    - In review of the limited BG data available , his BG's seem to be at acceptable range.  -  He has been checking once a day, he states its difficult to check more often then that, I have prescribed the freestyle libre in the past but he did not get it from the pharmacy.  - I do believe the barriers in this case are cost wise.  - Wife stated the insulin pricing has increased on the last pick up from $ 45 to $145. I am not sure of the reason. I am suspecting maybe the formulary changes or that they are using a non-preferred pharmacy.  - I will send a prescription for tresiba to [facility]  And see if this is any better pricing.  -We have discussed trying him on GLP-1 agonists in the past, but he was not keen on any add on therapy.  -[name] with severe insulin resistant this is partly due to his weight but also due to his CHF and fluid overload status. -He is intolerant to SGLT-2 inhibitors, he has tried Bydureon in the past with persistent hyperglycemia. He is not a candidate for metformin due to low GFR   MEDICATIONS:  Continue Lantus to 60 units daily  Continue NovoLog to 18 units with each meal  EDUCATION / INSTRUCTIONS:  BG monitoring instructions: [name] is instructed to check his blood sugars 4 times a day, before meals and at bedtime.  Call [facility] if: BG persistently < 70 or > 300. . I reviewed the Rule of 15 for the treatment of hypoglycemia in detail with the [name]. Literature supplied.    I discussed the assessment and treatment plan with the [name]. The [name] was provided an opportunity to ask questions and all were answered. The [name] agreed with the plan and demonstrated an understanding of the instructions.   The [name] was advised to call back or seek an in-person evaluation if the symptoms worsen or if the condition fails to improve as anticipated.   F/U in 3 months   Signed electronically by:  Mack Guise, MD  [facility]  [address] [place],  [address] Phone: [phone] FAX: [phone]   CC: [name], MD [address] [place] [address] Phone: [phone]  Fax: [phone]  Return to Endocrinology clinic as below: Future Appointments  Date Time Provider [place]  04/15/2019  9:10 AM , Melanie Crazier, MD [facility] None  04/27/2019 10:20 AM Bensimhon, Shaune Pascal, MD [facility] None  06/10/2019  9:20 AM Nahser, Wonda Cheng, MD [facility]  06/21/2019 10:00 AM Kathrynn Ducking, MD [facility] None

## 2019-04-15 NOTE — Telephone Encounter (Signed)
Debbie RN w/[facility] called to report pt's ReDS clip reading today is 24%, advised good reading pt improved symptoms since last weekend and feeling ok.  Will send to Lillia Mountain, NP as she did phone visit with pt yesterday

## 2019-04-15 NOTE — Progress Notes (Signed)
Home visit as requested by HF clinic done today around 1pm.  Travis [name] was in a sitting room in wheel chair in no distress, speaking in full, complete sentences with his wife and daughter in the kitchen having lunch.  Travis [name] stated he was doing much better than he had been on the previous Saturday when his wife states he was shob, abdomen was rigid/swollen, and his legs were weaping resulting in medication adjustments being made by his MD.  He was seen by a [facility] RN prior to my arrival today as well, however needed the ReDS clip since the ReDS vest [facility] uses wouldn't fit Travis [name]. Travis [name] stated he doesn't check his BP very often as he isn't sure if his cuff is accurate.  BP obtained by cuff: 126/62 and manually: 128/64 which let he and his wife feel more confident to use it more frequently. ReDS clip reading obtained at 24% and reported to [place], RN at the HF clinic.  No new orders given.  Pt nor wife express any needs and state he has follow-up appointments scheduled.

## 2019-04-20 DIAGNOSIS — M4800 Spinal stenosis, site unspecified: Secondary | ICD-10-CM | POA: Diagnosis not present

## 2019-04-20 DIAGNOSIS — G9340 Encephalopathy, unspecified: Secondary | ICD-10-CM | POA: Diagnosis not present

## 2019-04-20 DIAGNOSIS — I5033 Acute on chronic diastolic (congestive) heart failure: Secondary | ICD-10-CM | POA: Diagnosis not present

## 2019-04-26 DIAGNOSIS — N390 Urinary tract infection, site not specified: Secondary | ICD-10-CM | POA: Diagnosis not present

## 2019-04-27 ENCOUNTER — Encounter (HOSPITAL_COMMUNITY): Payer: PPO | Admitting: Internal Medicine

## 2019-04-30 ENCOUNTER — Emergency Department (HOSPITAL_COMMUNITY): Payer: PPO

## 2019-04-30 ENCOUNTER — Encounter (HOSPITAL_COMMUNITY): Payer: Self-pay | Admitting: *Deleted

## 2019-04-30 ENCOUNTER — Inpatient Hospital Stay (HOSPITAL_COMMUNITY)
Admission: EM | Admit: 2019-04-30 | Discharge: 2019-05-11 | DRG: 871 | Disposition: A | Discharge status: Home Health | Payer: PPO | Attending: Student | Admitting: Student

## 2019-04-30 ENCOUNTER — Other Ambulatory Visit: Payer: Self-pay

## 2019-04-30 DIAGNOSIS — N3000 Acute cystitis without hematuria: Secondary | ICD-10-CM | POA: Diagnosis not present

## 2019-04-30 DIAGNOSIS — E662 Morbid (severe) obesity with alveolar hypoventilation: Secondary | ICD-10-CM | POA: Diagnosis not present

## 2019-04-30 DIAGNOSIS — L03116 Cellulitis of left lower limb: Secondary | ICD-10-CM | POA: Diagnosis not present

## 2019-04-30 DIAGNOSIS — Z993 Dependence on wheelchair: Secondary | ICD-10-CM

## 2019-04-30 DIAGNOSIS — E876 Hypokalemia: Secondary | ICD-10-CM | POA: Diagnosis not present

## 2019-04-30 DIAGNOSIS — Z87891 Personal history of nicotine dependence: Secondary | ICD-10-CM | POA: Diagnosis not present

## 2019-04-30 DIAGNOSIS — I5033 Acute on chronic diastolic (congestive) heart failure: Secondary | ICD-10-CM | POA: Diagnosis present

## 2019-04-30 DIAGNOSIS — R601 Generalized edema: Secondary | ICD-10-CM | POA: Diagnosis present

## 2019-04-30 DIAGNOSIS — N179 Acute kidney failure, unspecified: Secondary | ICD-10-CM | POA: Diagnosis not present

## 2019-04-30 DIAGNOSIS — Z794 Long term (current) use of insulin: Secondary | ICD-10-CM

## 2019-04-30 DIAGNOSIS — I129 Hypertensive chronic kidney disease with stage 1 through stage 4 chronic kidney disease, or unspecified chronic kidney disease: Secondary | ICD-10-CM | POA: Diagnosis not present

## 2019-04-30 DIAGNOSIS — I130 Hypertensive heart and chronic kidney disease with heart failure and stage 1 through stage 4 chronic kidney disease, or unspecified chronic kidney disease: Secondary | ICD-10-CM | POA: Diagnosis not present

## 2019-04-30 DIAGNOSIS — I2510 Atherosclerotic heart disease of native coronary artery without angina pectoris: Secondary | ICD-10-CM | POA: Diagnosis present

## 2019-04-30 DIAGNOSIS — IMO0001 Reserved for inherently not codable concepts without codable children: Secondary | ICD-10-CM

## 2019-04-30 DIAGNOSIS — Z79890 Hormone replacement therapy (postmenopausal): Secondary | ICD-10-CM | POA: Diagnosis not present

## 2019-04-30 DIAGNOSIS — G2581 Restless legs syndrome: Secondary | ICD-10-CM | POA: Diagnosis present

## 2019-04-30 DIAGNOSIS — Z9989 Dependence on other enabling machines and devices: Secondary | ICD-10-CM | POA: Diagnosis not present

## 2019-04-30 DIAGNOSIS — Z6837 Body mass index (BMI) 37.0-37.9, adult: Secondary | ICD-10-CM

## 2019-04-30 DIAGNOSIS — L03115 Cellulitis of right lower limb: Secondary | ICD-10-CM | POA: Diagnosis present

## 2019-04-30 DIAGNOSIS — J9621 Acute and chronic respiratory failure with hypoxia: Secondary | ICD-10-CM | POA: Diagnosis not present

## 2019-04-30 DIAGNOSIS — I4510 Unspecified right bundle-branch block: Secondary | ICD-10-CM | POA: Diagnosis present

## 2019-04-30 DIAGNOSIS — Z833 Family history of diabetes mellitus: Secondary | ICD-10-CM

## 2019-04-30 DIAGNOSIS — N183 Chronic kidney disease, stage 3 unspecified: Secondary | ICD-10-CM | POA: Diagnosis present

## 2019-04-30 DIAGNOSIS — F0390 Unspecified dementia without behavioral disturbance: Secondary | ICD-10-CM | POA: Diagnosis present

## 2019-04-30 DIAGNOSIS — Z20828 Contact with and (suspected) exposure to other viral communicable diseases: Secondary | ICD-10-CM | POA: Diagnosis present

## 2019-04-30 DIAGNOSIS — E1122 Type 2 diabetes mellitus with diabetic chronic kidney disease: Secondary | ICD-10-CM | POA: Diagnosis present

## 2019-04-30 DIAGNOSIS — N390 Urinary tract infection, site not specified: Secondary | ICD-10-CM | POA: Diagnosis present

## 2019-04-30 DIAGNOSIS — I5031 Acute diastolic (congestive) heart failure: Secondary | ICD-10-CM | POA: Diagnosis not present

## 2019-04-30 DIAGNOSIS — Z888 Allergy status to other drugs, medicaments and biological substances status: Secondary | ICD-10-CM

## 2019-04-30 DIAGNOSIS — Z9981 Dependence on supplemental oxygen: Secondary | ICD-10-CM | POA: Diagnosis not present

## 2019-04-30 DIAGNOSIS — Z9111 Patient's noncompliance with dietary regimen: Secondary | ICD-10-CM

## 2019-04-30 DIAGNOSIS — I10 Essential (primary) hypertension: Secondary | ICD-10-CM | POA: Diagnosis present

## 2019-04-30 DIAGNOSIS — E1136 Type 2 diabetes mellitus with diabetic cataract: Secondary | ICD-10-CM | POA: Diagnosis present

## 2019-04-30 DIAGNOSIS — J449 Chronic obstructive pulmonary disease, unspecified: Secondary | ICD-10-CM | POA: Diagnosis not present

## 2019-04-30 DIAGNOSIS — N400 Enlarged prostate without lower urinary tract symptoms: Secondary | ICD-10-CM | POA: Diagnosis not present

## 2019-04-30 DIAGNOSIS — G4731 Primary central sleep apnea: Secondary | ICD-10-CM | POA: Diagnosis not present

## 2019-04-30 DIAGNOSIS — Z7982 Long term (current) use of aspirin: Secondary | ICD-10-CM | POA: Diagnosis not present

## 2019-04-30 DIAGNOSIS — J438 Other emphysema: Secondary | ICD-10-CM | POA: Diagnosis not present

## 2019-04-30 DIAGNOSIS — R069 Unspecified abnormalities of breathing: Secondary | ICD-10-CM | POA: Diagnosis not present

## 2019-04-30 DIAGNOSIS — E1142 Type 2 diabetes mellitus with diabetic polyneuropathy: Secondary | ICD-10-CM | POA: Diagnosis not present

## 2019-04-30 DIAGNOSIS — A419 Sepsis, unspecified organism: Secondary | ICD-10-CM | POA: Diagnosis present

## 2019-04-30 DIAGNOSIS — Z8249 Family history of ischemic heart disease and other diseases of the circulatory system: Secondary | ICD-10-CM | POA: Diagnosis not present

## 2019-04-30 DIAGNOSIS — F329 Major depressive disorder, single episode, unspecified: Secondary | ICD-10-CM | POA: Diagnosis present

## 2019-04-30 DIAGNOSIS — G4733 Obstructive sleep apnea (adult) (pediatric): Secondary | ICD-10-CM | POA: Diagnosis not present

## 2019-04-30 DIAGNOSIS — E785 Hyperlipidemia, unspecified: Secondary | ICD-10-CM | POA: Diagnosis present

## 2019-04-30 DIAGNOSIS — Z881 Allergy status to other antibiotic agents status: Secondary | ICD-10-CM

## 2019-04-30 DIAGNOSIS — L03119 Cellulitis of unspecified part of limb: Secondary | ICD-10-CM | POA: Diagnosis not present

## 2019-04-30 DIAGNOSIS — E1129 Type 2 diabetes mellitus with other diabetic kidney complication: Secondary | ICD-10-CM | POA: Diagnosis not present

## 2019-04-30 DIAGNOSIS — R0902 Hypoxemia: Secondary | ICD-10-CM | POA: Diagnosis not present

## 2019-04-30 DIAGNOSIS — E039 Hypothyroidism, unspecified: Secondary | ICD-10-CM | POA: Diagnosis not present

## 2019-04-30 DIAGNOSIS — E890 Postprocedural hypothyroidism: Secondary | ICD-10-CM | POA: Diagnosis not present

## 2019-04-30 DIAGNOSIS — N289 Disorder of kidney and ureter, unspecified: Secondary | ICD-10-CM

## 2019-04-30 DIAGNOSIS — R627 Adult failure to thrive: Secondary | ICD-10-CM | POA: Diagnosis present

## 2019-04-30 DIAGNOSIS — R0600 Dyspnea, unspecified: Secondary | ICD-10-CM

## 2019-04-30 DIAGNOSIS — R0602 Shortness of breath: Secondary | ICD-10-CM | POA: Diagnosis not present

## 2019-04-30 DIAGNOSIS — E1165 Type 2 diabetes mellitus with hyperglycemia: Secondary | ICD-10-CM | POA: Diagnosis present

## 2019-04-30 DIAGNOSIS — J9622 Acute and chronic respiratory failure with hypercapnia: Secondary | ICD-10-CM | POA: Diagnosis not present

## 2019-04-30 DIAGNOSIS — L0390 Cellulitis, unspecified: Secondary | ICD-10-CM | POA: Diagnosis not present

## 2019-04-30 DIAGNOSIS — E119 Type 2 diabetes mellitus without complications: Secondary | ICD-10-CM | POA: Diagnosis not present

## 2019-04-30 DIAGNOSIS — F32A Depression, unspecified: Secondary | ICD-10-CM | POA: Diagnosis present

## 2019-04-30 DIAGNOSIS — R0689 Other abnormalities of breathing: Secondary | ICD-10-CM | POA: Diagnosis not present

## 2019-04-30 DIAGNOSIS — L89152 Pressure ulcer of sacral region, stage 2: Secondary | ICD-10-CM | POA: Diagnosis present

## 2019-04-30 DIAGNOSIS — I5030 Unspecified diastolic (congestive) heart failure: Secondary | ICD-10-CM | POA: Diagnosis not present

## 2019-04-30 NOTE — ED Notes (Signed)
[phone] PTS WIFE CAROLYN [place] W/ PT UPDATE

## 2019-04-30 NOTE — ED Triage Notes (Signed)
The pt arrived by [facility] from home  He is on bi=pap at home  Son now  He has utis and needs antibiotics  Hx chf  He was to have a picc line placed next week to get his antibiotics according to the ems crew

## 2019-04-30 NOTE — ED Notes (Signed)
The pts wife has called stating that the pt called home telling her that he was ready to leave  I went in to talk with the pt he is confortable  He did not mention called his wife.  I called and talked to her she reports that the pts temp was sl elevated earlier today and sometimes he gets confused when he gets ill.  Lab work has already been drawn as soon as he arrived.

## 2019-05-01 ENCOUNTER — Other Ambulatory Visit: Payer: Self-pay

## 2019-05-01 ENCOUNTER — Inpatient Hospital Stay (HOSPITAL_COMMUNITY): Payer: PPO

## 2019-05-01 ENCOUNTER — Encounter (HOSPITAL_COMMUNITY): Payer: Self-pay | Admitting: Emergency Medicine

## 2019-05-01 DIAGNOSIS — Z9989 Dependence on other enabling machines and devices: Secondary | ICD-10-CM

## 2019-05-01 DIAGNOSIS — J9621 Acute and chronic respiratory failure with hypoxia: Secondary | ICD-10-CM | POA: Diagnosis present

## 2019-05-01 DIAGNOSIS — Z79890 Hormone replacement therapy (postmenopausal): Secondary | ICD-10-CM | POA: Diagnosis not present

## 2019-05-01 DIAGNOSIS — Z9981 Dependence on supplemental oxygen: Secondary | ICD-10-CM | POA: Diagnosis not present

## 2019-05-01 DIAGNOSIS — I5031 Acute diastolic (congestive) heart failure: Secondary | ICD-10-CM | POA: Diagnosis not present

## 2019-05-01 DIAGNOSIS — F32A Depression, unspecified: Secondary | ICD-10-CM | POA: Diagnosis present

## 2019-05-01 DIAGNOSIS — E1142 Type 2 diabetes mellitus with diabetic polyneuropathy: Secondary | ICD-10-CM | POA: Diagnosis present

## 2019-05-01 DIAGNOSIS — N3000 Acute cystitis without hematuria: Secondary | ICD-10-CM | POA: Diagnosis not present

## 2019-05-01 DIAGNOSIS — J438 Other emphysema: Secondary | ICD-10-CM | POA: Diagnosis not present

## 2019-05-01 DIAGNOSIS — Z794 Long term (current) use of insulin: Secondary | ICD-10-CM | POA: Diagnosis not present

## 2019-05-01 DIAGNOSIS — L03119 Cellulitis of unspecified part of limb: Secondary | ICD-10-CM | POA: Diagnosis not present

## 2019-05-01 DIAGNOSIS — G2581 Restless legs syndrome: Secondary | ICD-10-CM

## 2019-05-01 DIAGNOSIS — G4733 Obstructive sleep apnea (adult) (pediatric): Secondary | ICD-10-CM

## 2019-05-01 DIAGNOSIS — F329 Major depressive disorder, single episode, unspecified: Secondary | ICD-10-CM | POA: Diagnosis present

## 2019-05-01 DIAGNOSIS — I4510 Unspecified right bundle-branch block: Secondary | ICD-10-CM | POA: Diagnosis present

## 2019-05-01 DIAGNOSIS — N390 Urinary tract infection, site not specified: Secondary | ICD-10-CM | POA: Diagnosis present

## 2019-05-01 DIAGNOSIS — L03116 Cellulitis of left lower limb: Secondary | ICD-10-CM | POA: Diagnosis present

## 2019-05-01 DIAGNOSIS — E662 Morbid (severe) obesity with alveolar hypoventilation: Secondary | ICD-10-CM | POA: Diagnosis present

## 2019-05-01 DIAGNOSIS — L0390 Cellulitis, unspecified: Secondary | ICD-10-CM

## 2019-05-01 DIAGNOSIS — N183 Chronic kidney disease, stage 3 (moderate): Secondary | ICD-10-CM | POA: Diagnosis present

## 2019-05-01 DIAGNOSIS — R0602 Shortness of breath: Secondary | ICD-10-CM | POA: Diagnosis present

## 2019-05-01 DIAGNOSIS — A419 Sepsis, unspecified organism: Secondary | ICD-10-CM | POA: Diagnosis present

## 2019-05-01 DIAGNOSIS — Z833 Family history of diabetes mellitus: Secondary | ICD-10-CM | POA: Diagnosis not present

## 2019-05-01 DIAGNOSIS — N179 Acute kidney failure, unspecified: Secondary | ICD-10-CM | POA: Diagnosis not present

## 2019-05-01 DIAGNOSIS — I5033 Acute on chronic diastolic (congestive) heart failure: Secondary | ICD-10-CM | POA: Diagnosis present

## 2019-05-01 DIAGNOSIS — I10 Essential (primary) hypertension: Secondary | ICD-10-CM | POA: Diagnosis not present

## 2019-05-01 DIAGNOSIS — R601 Generalized edema: Secondary | ICD-10-CM | POA: Diagnosis not present

## 2019-05-01 DIAGNOSIS — Z87891 Personal history of nicotine dependence: Secondary | ICD-10-CM | POA: Diagnosis not present

## 2019-05-01 DIAGNOSIS — E119 Type 2 diabetes mellitus without complications: Secondary | ICD-10-CM

## 2019-05-01 DIAGNOSIS — J449 Chronic obstructive pulmonary disease, unspecified: Secondary | ICD-10-CM | POA: Diagnosis present

## 2019-05-01 DIAGNOSIS — E039 Hypothyroidism, unspecified: Secondary | ICD-10-CM

## 2019-05-01 DIAGNOSIS — L03115 Cellulitis of right lower limb: Secondary | ICD-10-CM | POA: Diagnosis present

## 2019-05-01 DIAGNOSIS — E1122 Type 2 diabetes mellitus with diabetic chronic kidney disease: Secondary | ICD-10-CM

## 2019-05-01 DIAGNOSIS — I130 Hypertensive heart and chronic kidney disease with heart failure and stage 1 through stage 4 chronic kidney disease, or unspecified chronic kidney disease: Secondary | ICD-10-CM | POA: Diagnosis present

## 2019-05-01 DIAGNOSIS — J9622 Acute and chronic respiratory failure with hypercapnia: Secondary | ICD-10-CM | POA: Diagnosis present

## 2019-05-01 DIAGNOSIS — Z20828 Contact with and (suspected) exposure to other viral communicable diseases: Secondary | ICD-10-CM | POA: Diagnosis present

## 2019-05-01 DIAGNOSIS — Z7982 Long term (current) use of aspirin: Secondary | ICD-10-CM | POA: Diagnosis not present

## 2019-05-01 DIAGNOSIS — Z8249 Family history of ischemic heart disease and other diseases of the circulatory system: Secondary | ICD-10-CM | POA: Diagnosis not present

## 2019-05-01 LAB — ECHOCARDIOGRAM LIMITED
Height: 73 in
Weight: 4622.4 oz

## 2019-05-01 LAB — URINALYSIS, ROUTINE W REFLEX MICROSCOPIC
Bilirubin Urine: NEGATIVE
Glucose, UA: 150 mg/dL — AB
Hgb urine dipstick: NEGATIVE
Ketones, ur: NEGATIVE mg/dL
Nitrite: NEGATIVE
Protein, ur: NEGATIVE mg/dL
Specific Gravity, Urine: 1.009 (ref 1.005–1.030)
pH: 5 (ref 5.0–8.0)

## 2019-05-01 LAB — LACTIC ACID, PLASMA
Lactic Acid, Venous: 1.1 mmol/L (ref 0.5–1.9)
Lactic Acid, Venous: 1.7 mmol/L (ref 0.5–1.9)

## 2019-05-01 LAB — CBC WITH DIFFERENTIAL/PLATELET
Abs Immature Granulocytes: 0.06 10*3/uL (ref 0.00–0.07)
Basophils Absolute: 0 10*3/uL (ref 0.0–0.1)
Basophils Relative: 0 %
Eosinophils Absolute: 0 10*3/uL (ref 0.0–0.5)
Eosinophils Relative: 0 %
HCT: 41.1 % (ref 39.0–52.0)
Hemoglobin: 13.1 g/dL (ref 13.0–17.0)
Immature Granulocytes: 1 %
Lymphocytes Relative: 6 %
Lymphs Abs: 0.7 10*3/uL (ref 0.7–4.0)
MCH: 29.2 pg (ref 26.0–34.0)
MCHC: 31.9 g/dL (ref 30.0–36.0)
MCV: 91.5 fL (ref 80.0–100.0)
Monocytes Absolute: 0.6 10*3/uL (ref 0.1–1.0)
Monocytes Relative: 5 %
Neutro Abs: 11 10*3/uL — ABNORMAL HIGH (ref 1.7–7.7)
Neutrophils Relative %: 88 %
Platelets: 195 10*3/uL (ref 150–400)
RBC: 4.49 MIL/uL (ref 4.22–5.81)
RDW: 15.1 % (ref 11.5–15.5)
WBC: 12.4 10*3/uL — ABNORMAL HIGH (ref 4.0–10.5)
nRBC: 0 % (ref 0.0–0.2)

## 2019-05-01 LAB — PROCALCITONIN: Procalcitonin: <0.1 ng/mL

## 2019-05-01 LAB — GLUCOSE, CAPILLARY
Glucose-Capillary: 235 mg/dL — ABNORMAL HIGH (ref 70–99)
Glucose-Capillary: 322 mg/dL — ABNORMAL HIGH (ref 70–99)
Glucose-Capillary: 183 mg/dL — ABNORMAL HIGH (ref 70–99)
Glucose-Capillary: 244 mg/dL — ABNORMAL HIGH (ref 70–99)

## 2019-05-01 LAB — BASIC METABOLIC PANEL
Anion gap: 14 (ref 5–15)
BUN: 68 mg/dL — ABNORMAL HIGH (ref 8–23)
CO2: 33 mmol/L — ABNORMAL HIGH (ref 22–32)
Calcium: 9.3 mg/dL (ref 8.9–10.3)
Chloride: 93 mmol/L — ABNORMAL LOW (ref 98–111)
Creatinine, Ser: 1.94 mg/dL — ABNORMAL HIGH (ref 0.61–1.24)
GFR calc Af Amer: 38 mL/min — ABNORMAL LOW (ref >60)
GFR calc non Af Amer: 33 mL/min — ABNORMAL LOW (ref >60)
Glucose, Bld: 219 mg/dL — ABNORMAL HIGH (ref 70–99)
Potassium: 4.2 mmol/L (ref 3.5–5.1)
Sodium: 140 mmol/L (ref 135–145)

## 2019-05-01 LAB — D-DIMER, QUANTITATIVE: D-Dimer, Quant: 0.91 ug/mL-FEU — ABNORMAL HIGH (ref 0.00–0.50)

## 2019-05-01 LAB — TROPONIN I: Troponin I: <0.03 ng/mL (ref <0.03)

## 2019-05-01 LAB — MRSA PCR SCREENING: MRSA by PCR: NEGATIVE

## 2019-05-01 LAB — SARS CORONAVIRUS 2 BY RT PCR (HOSPITAL ORDER, PERFORMED IN ~~LOC~~ HOSPITAL LAB): SARS Coronavirus 2: NEGATIVE

## 2019-05-01 LAB — HEPATIC FUNCTION PANEL
ALT: 16 U/L (ref 0–44)
AST: 16 U/L (ref 15–41)
Albumin: 3.4 g/dL — ABNORMAL LOW (ref 3.5–5.0)
Alkaline Phosphatase: 80 U/L (ref 38–126)
Bilirubin, Direct: 0.2 mg/dL (ref 0.0–0.2)
Indirect Bilirubin: 0.7 mg/dL (ref 0.3–0.9)
Total Bilirubin: 0.9 mg/dL (ref 0.3–1.2)
Total Protein: 7.1 g/dL (ref 6.5–8.1)

## 2019-05-01 LAB — C-REACTIVE PROTEIN: CRP: 3.6 mg/dL — ABNORMAL HIGH (ref <1.0)

## 2019-05-01 LAB — LACTATE DEHYDROGENASE: LDH: 210 U/L — ABNORMAL HIGH (ref 98–192)

## 2019-05-01 LAB — BRAIN NATRIURETIC PEPTIDE: B Natriuretic Peptide: 87.6 pg/mL (ref 0.0–100.0)

## 2019-05-01 LAB — SEDIMENTATION RATE: Sed Rate: 58 mm/hr — ABNORMAL HIGH (ref 0–16)

## 2019-05-01 MED ORDER — ASPIRIN EC 81 MG PO TBEC
81.0000 mg | DELAYED_RELEASE_TABLET | Freq: Every day | ORAL | Status: DC
  Administered 2019-05-01 – 2019-05-11 (×11): 81 mg via ORAL
  Filled 2019-05-01 (×12): qty 1

## 2019-05-01 MED ORDER — GABAPENTIN 400 MG PO CAPS
400.0000 mg | ORAL_CAPSULE | Freq: Three times a day (TID) | ORAL | Status: DC
  Administered 2019-05-01 – 2019-05-11 (×31): 400 mg via ORAL
  Filled 2019-05-01 (×31): qty 1

## 2019-05-01 MED ORDER — ACETAMINOPHEN 325 MG PO TABS
650.0000 mg | ORAL_TABLET | ORAL | Status: DC | PRN
  Administered 2019-05-05 – 2019-05-07 (×3): 650 mg via ORAL
  Filled 2019-05-01 (×3): qty 2

## 2019-05-01 MED ORDER — LEVOTHYROXINE SODIUM 75 MCG PO TABS
175.0000 ug | ORAL_TABLET | Freq: Every day | ORAL | Status: DC
  Administered 2019-05-01 – 2019-05-11 (×11): 175 ug via ORAL
  Filled 2019-05-01 (×11): qty 1

## 2019-05-01 MED ORDER — ALBUTEROL SULFATE HFA 108 (90 BASE) MCG/ACT IN AERS
8.0000 | INHALATION_SPRAY | RESPIRATORY_TRACT | Status: AC
  Administered 2019-05-01: 8 via RESPIRATORY_TRACT
  Filled 2019-05-01: qty 6.7

## 2019-05-01 MED ORDER — DOXYCYCLINE HYCLATE 100 MG PO TABS
100.0000 mg | ORAL_TABLET | Freq: Two times a day (BID) | ORAL | Status: DC
  Administered 2019-05-01 – 2019-05-08 (×16): 100 mg via ORAL
  Filled 2019-05-01 (×16): qty 1

## 2019-05-01 MED ORDER — VANCOMYCIN HCL 10 G IV SOLR
1500.0000 mg | INTRAVENOUS | Status: DC
  Filled 2019-05-01: qty 1500

## 2019-05-01 MED ORDER — DM-GUAIFENESIN ER 30-600 MG PO TB12
1.0000 | ORAL_TABLET | Freq: Two times a day (BID) | ORAL | Status: DC | PRN
  Administered 2019-05-01: 1 via ORAL
  Filled 2019-05-01: qty 1

## 2019-05-01 MED ORDER — ENOXAPARIN SODIUM 80 MG/0.8ML ~~LOC~~ SOLN
65.0000 mg | SUBCUTANEOUS | Status: DC
  Administered 2019-05-02 – 2019-05-04 (×3): 65 mg via SUBCUTANEOUS
  Filled 2019-05-01 (×3): qty 0.8

## 2019-05-01 MED ORDER — IPRATROPIUM BROMIDE HFA 17 MCG/ACT IN AERS
2.0000 | INHALATION_SPRAY | Freq: Four times a day (QID) | RESPIRATORY_TRACT | Status: DC
  Administered 2019-05-02: 2 via RESPIRATORY_TRACT
  Filled 2019-05-01: qty 12.9

## 2019-05-01 MED ORDER — PERFLUTREN LIPID MICROSPHERE
1.0000 mL | INTRAVENOUS | Status: AC | PRN
  Administered 2019-05-01: 2 mL via INTRAVENOUS
  Filled 2019-05-01: qty 10

## 2019-05-01 MED ORDER — ENOXAPARIN SODIUM 40 MG/0.4ML ~~LOC~~ SOLN
40.0000 mg | SUBCUTANEOUS | Status: DC
  Administered 2019-05-01: 40 mg via SUBCUTANEOUS
  Filled 2019-05-01: qty 0.4

## 2019-05-01 MED ORDER — PRAMIPEXOLE DIHYDROCHLORIDE 0.25 MG PO TABS
0.5000 mg | ORAL_TABLET | Freq: Every morning | ORAL | Status: DC
  Administered 2019-05-01 – 2019-05-11 (×11): 0.5 mg via ORAL
  Filled 2019-05-01 (×11): qty 2

## 2019-05-01 MED ORDER — INSULIN GLARGINE 100 UNIT/ML SOLOSTAR PEN: 30.0000 [IU] | PEN_INJECTOR | Freq: Every day | SUBCUTANEOUS | Status: DC

## 2019-05-01 MED ORDER — INSULIN ASPART 100 UNIT/ML ~~LOC~~ SOLN
0.0000 [IU] | Freq: Three times a day (TID) | SUBCUTANEOUS | Status: DC
  Administered 2019-05-01: 2 [IU] via SUBCUTANEOUS
  Administered 2019-05-01: 3 [IU] via SUBCUTANEOUS
  Administered 2019-05-01: 7 [IU] via SUBCUTANEOUS
  Administered 2019-05-02: 5 [IU] via SUBCUTANEOUS
  Administered 2019-05-02: 2 [IU] via SUBCUTANEOUS
  Administered 2019-05-02 – 2019-05-03 (×3): 5 [IU] via SUBCUTANEOUS
  Administered 2019-05-03: 2 [IU] via SUBCUTANEOUS
  Administered 2019-05-04: 7 [IU] via SUBCUTANEOUS
  Administered 2019-05-04: 3 [IU] via SUBCUTANEOUS

## 2019-05-01 MED ORDER — ALBUTEROL SULFATE HFA 108 (90 BASE) MCG/ACT IN AERS
2.0000 | INHALATION_SPRAY | RESPIRATORY_TRACT | Status: DC | PRN
  Filled 2019-05-01: qty 6.7

## 2019-05-01 MED ORDER — SODIUM CHLORIDE 0.9% FLUSH: 3.0000 mL | INTRAVENOUS | Status: DC | PRN

## 2019-05-01 MED ORDER — PRAMIPEXOLE DIHYDROCHLORIDE 0.25 MG PO TABS: 0.5000 mg | ORAL_TABLET | ORAL | Status: DC

## 2019-05-01 MED ORDER — HYDRALAZINE HCL 20 MG/ML IJ SOLN: 5.0000 mg | INTRAMUSCULAR | Status: DC | PRN

## 2019-05-01 MED ORDER — ADULT MULTIVITAMIN W/MINERALS CH
1.0000 | ORAL_TABLET | Freq: Every day | ORAL | Status: DC
  Administered 2019-05-01 – 2019-05-10 (×10): 1 via ORAL
  Filled 2019-05-01 (×10): qty 1

## 2019-05-01 MED ORDER — POLYETHYLENE GLYCOL 3350 17 G PO PACK
17.0000 g | PACK | Freq: Every day | ORAL | Status: DC
  Administered 2019-05-05: 17 g via ORAL
  Filled 2019-05-01 (×3): qty 1

## 2019-05-01 MED ORDER — FESOTERODINE FUMARATE ER 8 MG PO TB24
8.0000 mg | ORAL_TABLET | Freq: Every day | ORAL | Status: DC
  Administered 2019-05-01 – 2019-05-11 (×11): 8 mg via ORAL
  Filled 2019-05-01 (×11): qty 1

## 2019-05-01 MED ORDER — INSULIN GLARGINE 100 UNIT/ML ~~LOC~~ SOLN
30.0000 [IU] | Freq: Every day | SUBCUTANEOUS | Status: DC
  Administered 2019-05-01 – 2019-05-02 (×2): 30 [IU] via SUBCUTANEOUS
  Filled 2019-05-01 (×4): qty 0.3

## 2019-05-01 MED ORDER — DOCUSATE SODIUM 100 MG PO CAPS
200.0000 mg | ORAL_CAPSULE | Freq: Every day | ORAL | Status: DC
  Administered 2019-05-01 – 2019-05-10 (×10): 200 mg via ORAL
  Filled 2019-05-01 (×10): qty 2

## 2019-05-01 MED ORDER — ACETAMINOPHEN 325 MG PO TABS
650.0000 mg | ORAL_TABLET | Freq: Once | ORAL | Status: AC
  Administered 2019-05-01: 650 mg via ORAL
  Filled 2019-05-01: qty 2

## 2019-05-01 MED ORDER — PIPERACILLIN-TAZOBACTAM 3.375 G IVPB 30 MIN
3.3750 g | Freq: Once | INTRAVENOUS | Status: AC
  Administered 2019-05-01: 3.375 g via INTRAVENOUS
  Filled 2019-05-01: qty 50

## 2019-05-01 MED ORDER — METOPROLOL TARTRATE 12.5 MG HALF TABLET
12.5000 mg | ORAL_TABLET | Freq: Two times a day (BID) | ORAL | Status: DC
  Administered 2019-05-01 – 2019-05-06 (×11): 12.5 mg via ORAL
  Filled 2019-05-01 (×11): qty 1

## 2019-05-01 MED ORDER — PRAMIPEXOLE DIHYDROCHLORIDE 1 MG PO TABS
1.0000 mg | ORAL_TABLET | Freq: Every evening | ORAL | Status: DC
  Administered 2019-05-01 – 2019-05-10 (×10): 1 mg via ORAL
  Filled 2019-05-01 (×11): qty 1

## 2019-05-01 MED ORDER — ESCITALOPRAM OXALATE 10 MG PO TABS
20.0000 mg | ORAL_TABLET | Freq: Every day | ORAL | Status: DC
  Administered 2019-05-01 – 2019-05-11 (×11): 20 mg via ORAL
  Filled 2019-05-01 (×11): qty 2

## 2019-05-01 MED ORDER — SODIUM CHLORIDE 0.9% FLUSH
3.0000 mL | Freq: Two times a day (BID) | INTRAVENOUS | Status: DC
  Administered 2019-05-01 – 2019-05-11 (×21): 3 mL via INTRAVENOUS

## 2019-05-01 MED ORDER — TAMSULOSIN HCL 0.4 MG PO CAPS
0.4000 mg | ORAL_CAPSULE | Freq: Every day | ORAL | Status: DC
  Administered 2019-05-01 – 2019-05-10 (×10): 0.4 mg via ORAL
  Filled 2019-05-01 (×10): qty 1

## 2019-05-01 MED ORDER — SODIUM CHLORIDE 0.9 % IV SOLN
250.0000 mL | INTRAVENOUS | Status: DC | PRN
  Administered 2019-05-03: via INTRAVENOUS
  Administered 2019-05-05: 250 mL via INTRAVENOUS
  Administered 2019-05-05: 05:00:00 via INTRAVENOUS

## 2019-05-01 MED ORDER — FUROSEMIDE 10 MG/ML IJ SOLN
40.0000 mg | Freq: Once | INTRAMUSCULAR | Status: AC
  Administered 2019-05-01: 40 mg via INTRAVENOUS
  Filled 2019-05-01: qty 4

## 2019-05-01 MED ORDER — FUROSEMIDE 10 MG/ML IJ SOLN
10.0000 mg/h | INTRAVENOUS | Status: AC
  Administered 2019-05-01: 10 mg/h via INTRAVENOUS
  Filled 2019-05-01: qty 25

## 2019-05-01 MED ORDER — VANCOMYCIN HCL IN DEXTROSE 1-5 GM/200ML-% IV SOLN: 1000.0000 mg | Freq: Once | INTRAVENOUS | Status: DC

## 2019-05-01 MED ORDER — ONDANSETRON HCL 4 MG/2ML IJ SOLN: 4.0000 mg | Freq: Four times a day (QID) | INTRAMUSCULAR | Status: DC | PRN

## 2019-05-01 MED ORDER — VANCOMYCIN HCL IN DEXTROSE 1-5 GM/200ML-% IV SOLN
1000.0000 mg | Freq: Once | INTRAVENOUS | Status: AC
  Administered 2019-05-01: 1000 mg via INTRAVENOUS
  Filled 2019-05-01: qty 200

## 2019-05-01 MED ORDER — ASPIRIN 81 MG PO TBEC: 81.0000 mg | DELAYED_RELEASE_TABLET | Freq: Every day | ORAL | Status: DC

## 2019-05-01 MED ORDER — IPRATROPIUM BROMIDE HFA 17 MCG/ACT IN AERS
2.0000 | INHALATION_SPRAY | RESPIRATORY_TRACT | Status: DC
  Administered 2019-05-01 (×4): 2 via RESPIRATORY_TRACT
  Filled 2019-05-01 (×2): qty 12.9

## 2019-05-01 MED ORDER — PIPERACILLIN-TAZOBACTAM 3.375 G IVPB
3.3750 g | Freq: Three times a day (TID) | INTRAVENOUS | Status: DC
  Administered 2019-05-01 – 2019-05-06 (×15): 3.375 g via INTRAVENOUS
  Filled 2019-05-01 (×17): qty 50

## 2019-05-01 MED ORDER — ATORVASTATIN CALCIUM 10 MG PO TABS
20.0000 mg | ORAL_TABLET | Freq: Every day | ORAL | Status: DC
  Administered 2019-05-01 – 2019-05-10 (×10): 20 mg via ORAL
  Filled 2019-05-01 (×10): qty 2

## 2019-05-01 NOTE — Progress Notes (Addendum)
PROGRESS NOTE  Travis [name] TTS:[id number] DOB: 1946-07-22 DOA: 04/30/2019 PCP: [name], MD  HPI/Recap of past 24 hours:  Denies chest pain,  Report progressive sob and worsening of edema for several weeks, it got better when heart doctor upped his torsamide dose , it got worse once dose decreased  He is slightly dyspnea when talking,    Wife think he gained weight ,but he can not weigh himself due to unsteadiness  He is bed to wheel chair bound due to debility  Wife states patient is noncompliance with diet, he does not check his blood sugar often either   No fever  Wife reports patient has been c/o urinary frequency and burning with urinary for several weeks, pcp had home health RN collected urine and did culture, pcp prescribed cipro, he took it for two days, then  They were told by pcp " he needs picc line placement for abx, cipro wont work" Wife reports patient had mild temp  (100.5) at home last night , he was shaky, so he was brought to [facility] ED Wife wondering if patient is going to have a picc line placed or not    Assessment/Plan: Principal Problem:   Acute on chronic diastolic CHF (congestive heart failure) ([place]) Active Problems:   Sepsis ([place])   OSA on CPAP   Hypothyroid   Stage 3 chronic kidney disease ([place])   Benign essential HTN   Type 2 diabetes mellitus with stage 3 chronic kidney disease, with long-term current use of insulin ([place])   Anasarca   Cellulitis of lower extremity   COPD (chronic obstructive pulmonary disease) ([place])   Depression   RLS (restless legs syndrome)   Acute on chronic diastolic CHF (congestive heart failure) ([place]):  -pt has SOB and anasarca -Chest x-ray showed pulmonary vascular congestion, consistent with CHF exacerbation.  2D echo on 06/29/2018 showed EF of 55 to 60% with grade 1 diastolic dysfunction. -venous doppler negative for DVT, echo pending -he appear significantly volume overloaded ,start lasix drip, consult  cardiology  Sepsis? No fever -cellulitis? Legs Does look inflammed, but does not look grossly infected - blood culture negative, mrsa screen negative -SARS CoV2 negative -h/o pseudomona uti, urine culture in process  -d/c vanc, add doxycyclin, continue zosyn for now  I think heart failure exacerbation is the main issue here, I am not sure patient is having sepsis, but will await urine culture and monitor fever curve  Insulin dependent DM2, uncontrolled, noncompliant with diet -With hyperglycemia -check a1c -adjust insulin  CKDIII Renal dosing meds  COPD (chronic obstructive pulmonary disease) ([place]): on 2L nasal cannula oxygen at home. Currently no wheezing  Depression: -Lexapro  RLS (restless legs syndrome): -Pramipexole  Morbid obesity/OSA Body mass index is 38.12 kg/m.   FTT:  reports falls at home, reports started use wheelchair/walker at home will get PT  Code Status: full  Family Communication: patient   Disposition Plan: home with home health in 2-3 days, he appear significantly volume overloaded   Consultants:  cardiology  Procedures:  none  Antibiotics:  vanc on admission   Zosyn from admission   Objective: BP (!) 121/50 (BP Location: Right Arm)   Pulse 65   Temp 98 F (36.7 C) (Oral)   Resp 20   Ht 6\' 1"  (1.854 m)   Wt 131 kg   SpO2 95%   BMI 38.12 kg/m   Intake/Output Summary (Last 24 hours) at 05/01/2019 1502 Last data filed at 05/01/2019 1206 Gross per 24  hour  Intake 123 ml  Output 1375 ml  Net -1252 ml   Filed Weights   04/30/19 2147 05/01/19 0616  Weight: (!) 172.4 kg 131 kg    Exam: Patient is examined daily including today on 05/01/2019, exams remain the same as of yesterday except that has changed    General:  Chronically ill,   Cardiovascular: RRR  Respiratory: diminished at basis, no wheezing, no rales, no rhonchi  Abdomen: Soft/ND/NT, positive BS  Musculoskeletal: 3+bilateral lower extremity Edema, skin  inflamed,   Neuro: alert, oriented   Data Reviewed: Basic Metabolic Panel: Recent Labs  Lab 05/01/19 0123  NA 140  K 4.2  CL 93*  CO2 33*  GLUCOSE 219*  BUN 68*  CREATININE 1.94*  CALCIUM 9.3   Liver Function Tests: Recent Labs  Lab 04/30/19 2337  AST 16  ALT 16  ALKPHOS 80  BILITOT 0.9  PROT 7.1  ALBUMIN 3.4*   No results for input(s): LIPASE, AMYLASE in the last 168 hours. No results for input(s): AMMONIA in the last 168 hours. CBC: Recent Labs  Lab 05/01/19 0123  WBC 12.4*  NEUTROABS 11.0*  HGB 13.1  HCT 41.1  MCV 91.5  PLT 195   Cardiac Enzymes:   Recent Labs  Lab 05/01/19 0123  TROPONINI <0.03   BNP (last 3 results) Recent Labs    10/27/18 1013 03/02/19 1157 05/01/19 0123  BNP 38.1 44.4 87.6    ProBNP (last 3 results) No results for input(s): PROBNP in the last 8760 hours.  CBG: Recent Labs  Lab 05/01/19 0646 05/01/19 1100  GLUCAP 235* 322*    Recent Results (from the past 240 hour(s))  SARS Coronavirus 2 [facility] order, Performed in [facility] hospital lab)     Status: None   Collection Time: 05/01/19  3:24 AM  Result Value Ref Range Status   SARS Coronavirus 2 NEGATIVE NEGATIVE Final    Comment: (NOTE) If result is NEGATIVE SARS-CoV-2 target nucleic acids are NOT DETECTED. The SARS-CoV-2 RNA is generally detectable in upper and lower  respiratory specimens during the acute phase of infection. The lowest  concentration of SARS-CoV-2 viral copies this assay can detect is 250  copies / mL. A negative result does not preclude SARS-CoV-2 infection  and should not be used as the sole basis for treatment or other  patient management decisions.  A negative result may occur with  improper specimen collection / handling, submission of specimen other  than nasopharyngeal swab, presence of viral mutation(s) within the  areas targeted by this assay, and inadequate number of viral copies  (<250 copies / mL). A negative result must be  combined with clinical  observations, patient history, and epidemiological information. If result is POSITIVE SARS-CoV-2 target nucleic acids are DETECTED. The SARS-CoV-2 RNA is generally detectable in upper and lower  respiratory specimens dur ing the acute phase of infection.  Positive  results are indicative of active infection with SARS-CoV-2.  Clinical  correlation with patient history and other diagnostic information is  necessary to determine patient infection status.  Positive results do  not rule out bacterial infection or co-infection with other viruses. If result is PRESUMPTIVE POSTIVE SARS-CoV-2 nucleic acids MAY BE PRESENT.   A presumptive positive result was obtained on the submitted specimen  and confirmed on repeat testing.  While 2019 novel coronavirus  (SARS-CoV-2) nucleic acids may be present in the submitted sample  additional confirmatory testing may be necessary for epidemiological  and / or clinical management  purposes  to differentiate between  SARS-CoV-2 and other Sarbecovirus currently known to infect humans.  If clinically indicated additional testing with an alternate test  methodology [phone]) is advised. The SARS-CoV-2 RNA is generally  detectable in upper and lower respiratory sp ecimens during the acute  phase of infection. The expected result is Negative. Fact Sheet for Patients:  StrictlyIdeas.no Fact Sheet for Healthcare Providers: BankingDealers.co.za This test is not yet approved or cleared by the [place] FDA and has been authorized for detection and/or diagnosis of SARS-CoV-2 by FDA under an Emergency Use Authorization (EUA).  This EUA will remain in effect (meaning this test can be used) for the duration of the COVID-19 declaration under Section 564(b)(1) of the Act, 21 U.S.C. section 360bbb-3(b)(1), unless the authorization is terminated or revoked sooner. Performed at [facility], [place] [address], [place], [place] [address]   Culture, blood (Routine X 2) w Reflex to ID Panel     Status: None (Preliminary result)   Collection Time: 05/01/19  3:52 AM  Result Value Ref Range Status   Specimen Description BLOOD RIGHT ARM  Final   Special Requests   Final    BOTTLES DRAWN AEROBIC AND ANAEROBIC Blood Culture results may not be optimal due to an excessive volume of blood received in culture bottles   Culture   Final    NO GROWTH < 12 HOURS Performed at [facility], [place] [address], [place], [place] [address]    Report Status PENDING  Incomplete  Culture, blood (Routine X 2) w Reflex to ID Panel     Status: None (Preliminary result)   Collection Time: 05/01/19  3:52 AM  Result Value Ref Range Status   Specimen Description BLOOD LEFT ARM  Final   Special Requests   Final    BOTTLES DRAWN AEROBIC ONLY Blood Culture adequate volume   Culture   Final    NO GROWTH < 12 HOURS Performed at [facility], [place] [address] [place], [place] [address]    Report Status PENDING  Incomplete     Studies: Dg Chest Portable 1 View  Result Date: 05/01/2019 CLINICAL DATA:  Shortness of breath EXAM: PORTABLE CHEST 1 VIEW COMPARISON:  11/29/2018 FINDINGS: Very low lung volumes. Bibasilar atelectasis. Vascular congestion. No visible effusions or acute bony abnormality. IMPRESSION: Very low lung volumes with bibasilar atelectasis and vascular congestion. Electronically Signed   By: Rolm Baptise M.D.   On: 05/01/2019 00:50   Vas [place] Lower Extremity Venous (dvt)  Result Date: 05/01/2019  Lower Venous Study Indications: Cellulitis.  Limitations: Poor ultrasound/tissue interface. Performing Technologist: Oliver Hum RVT  Examination Guidelines: A complete evaluation includes B-mode imaging, spectral Doppler, color Doppler, and power Doppler as needed of all accessible portions of each vessel. Bilateral testing is considered an integral part of a complete examination. Limited  examinations for reoccurring indications may be performed as noted.  +---------+---------------+---------+-----------+----------+-------+ RIGHT    CompressibilityPhasicitySpontaneityPropertiesSummary +---------+---------------+---------+-----------+----------+-------+ CFV      Full           Yes      Yes                          +---------+---------------+---------+-----------+----------+-------+ SFJ      Full                                                 +---------+---------------+---------+-----------+----------+-------+  FV Prox  Full                                                 +---------+---------------+---------+-----------+----------+-------+ FV Mid   Full                                                 +---------+---------------+---------+-----------+----------+-------+ FV DistalFull           Yes      Yes                          +---------+---------------+---------+-----------+----------+-------+ PFV      Full                                                 +---------+---------------+---------+-----------+----------+-------+ POP      Full           Yes      Yes                          +---------+---------------+---------+-----------+----------+-------+ PTV      Full                                                 +---------+---------------+---------+-----------+----------+-------+ PERO     Full                                                 +---------+---------------+---------+-----------+----------+-------+   +---------+---------------+---------+-----------+----------+--------------+ LEFT     CompressibilityPhasicitySpontaneityPropertiesSummary        +---------+---------------+---------+-----------+----------+--------------+ CFV      Full           Yes      Yes                                 +---------+---------------+---------+-----------+----------+--------------+ SFJ      Full                                                         +---------+---------------+---------+-----------+----------+--------------+ FV Prox  Full                                                        +---------+---------------+---------+-----------+----------+--------------+ FV Mid   Full                                                        +---------+---------------+---------+-----------+----------+--------------+  FV DistalFull           Yes      Yes                                 +---------+---------------+---------+-----------+----------+--------------+ PFV      Full                                                        +---------+---------------+---------+-----------+----------+--------------+ POP      Full           Yes      Yes                                 +---------+---------------+---------+-----------+----------+--------------+ PTV      Full                                                        +---------+---------------+---------+-----------+----------+--------------+ PERO                                                  Not visualized +---------+---------------+---------+-----------+----------+--------------+     Summary: Right: There is no evidence of deep vein thrombosis in the lower extremity. However, portions of this examination were limited- see technologist comments above. No cystic structure found in the popliteal fossa. Left: There is no evidence of deep vein thrombosis in the lower extremity. However, portions of this examination were limited- see technologist comments above. No cystic structure found in the popliteal fossa.  *See table(s) above for measurements and observations.    Preliminary     Scheduled Meds: . aspirin EC  81 mg Oral Daily  . atorvastatin  20 mg Oral q1800  . docusate sodium  200 mg Oral QHS  . doxycycline  100 mg Oral Q12H  . [START ON 05/02/2019] enoxaparin (LOVENOX) injection  65 mg Subcutaneous Q24H  . escitalopram  20 mg Oral Daily   . fesoterodine  8 mg Oral Daily  . gabapentin  400 mg Oral TID  . insulin aspart  0-9 Units Subcutaneous TID WC  . insulin glargine  30 Units Subcutaneous QHS  . ipratropium  2 puff Inhalation Q4H  . levothyroxine  175 mcg Oral Daily  . metoprolol tartrate  12.5 mg Oral BID  . multivitamin with minerals  1 tablet Oral QHS  . polyethylene glycol  17 g Oral QHS  . pramipexole  0.5 mg Oral q morning - 10a  . pramipexole  1 mg Oral QPM  . sodium chloride flush  3 mL Intravenous Q12H  . tamsulosin  0.4 mg Oral QHS    Continuous Infusions: . sodium chloride    . piperacillin-tazobactam (ZOSYN)  IV 3.375 g (05/01/19 1143)     Time spent: 33mins I have personally reviewed and interpreted on  05/01/2019 daily labs, tele strips, imagings as discussed above under date review session and assessment and plans.  I  reviewed all nursing notes, pharmacy notes, consultant notes,  vitals, pertinent old records  I have discussed plan of care as described above with RN , patient and wife  on 05/01/2019   Travis Reasons MD, PhD  [facility] Pager [phone]. If 7PM-7AM, please contact night-coverage at [email or web address], password [facility] 05/01/2019, 3:02 PM  LOS: 0 days

## 2019-05-01 NOTE — ED Notes (Signed)
Blood drawn shortley after he arrived  Lab has lost the blood had to be redrawn

## 2019-05-01 NOTE — ED Notes (Signed)
The pts wife has been called and was given an update  Will call back with a room number

## 2019-05-01 NOTE — ED Notes (Signed)
Temp down

## 2019-05-01 NOTE — H&P (Signed)
History and Physical    Travis [name] YWV:[id number] DOB: [birth year] DOA: 04/30/2019  Referring MD/NP/PA:   PCP: [name], MD   Patient coming from:  The patient is coming from home.  At baseline, pt is independent for most of ADL.        Chief Complaint: Bilateral leg swelling and pain, shortness of breath, dysuria  HPI: Travis [name] is a 73 y.o. male with medical history significant of hypertension, hyperlipidemia, diabetes mellitus, COPD on 2 L nasal cannula oxygen at home, hypothyroidism, depression, CHF, right bundle blockage, RLS, OSA on CPAP, dementia, CKD stage III, who presents with bilateral leg swelling and pain, shortness of breath.  Patient states that he has bilateral leg swelling and pain which has been going on for a while.  His leg also erythematous.  Actually he has anasarca. He denies fever or chills.  Patient has shortness of breath, but denies cough or chest pain.  He has runny nose which he attributes to allergy.  No sore throat.  He also has dysuria, burning on urination and increased urinary frequency.  No nausea vomiting, diarrhea or abdominal pain.  No unilateral weakness.  ED Course: pt was found to have WBC 12.4, BNP 87.6, negative troponin, stable renal function, temperature normal, UA (clear appearance, moderate amount of leukocyte, rare bacteria, WBC 21-50), no tachycardia, has tachypnea, oxygen saturation 91-94% on room air, chest x-ray showed low volume and vascular congestion without obvious infiltration.  Pending d-dimer and COVID-19 test.  Review of Systems:   General: no fevers, chills, has body weight gain, has fatigue HEENT: no blurry vision, hearing changes or sore throat Respiratory: has dyspnea, no coughing, wheezing CV: no chest pain, no palpitations GI: no nausea, vomiting, abdominal pain, diarrhea, constipation GU: has dysuria, burning on urination, increased urinary frequency, no hematuria  Ext: has leg edema and anasarca Neuro: no  unilateral weakness, numbness, or tingling, no vision change or hearing loss Skin: no rash, no skin tear. MSK: No muscle spasm, no deformity, no limitation of range of movement in spin Heme: No easy bruising.  Travel history: No recent long distant travel.  Allergy:  Allergies  Allergen Reactions  . Amlodipine Swelling and Other (See Comments)    Causes edema (per [facility] paperwork)  . Plendil [Felodipine] Swelling and Other (See Comments)    Causes edema (per [facility] paperwork)  . Compazine [Prochlorperazine] Other (See Comments)    Caused agitation (per [facility] paperwork)  . Cymbalta [Duloxetine Hcl] Other (See Comments)    Made the patient "feel weird," per [facility] paperwork  . Jardiance [Empagliflozin] Other (See Comments)    Stopped by MD because of unwanted side effects  . Other Nausea Only and Other (See Comments)    ANESTHESIA UNSPECIFIED- makes sick, must have anti-nausea medication in advance  . Levofloxacin In D5w Itching    Possible infusion reaction 03/29/17. Tolerating oral Levaquin 03/30/17  . Septra [Sulfamethoxazole-Trimethoprim] Nausea Only and Other (See Comments)    Per [facility] paperwork    Past Medical History:  Diagnosis Date  . Cervical myelopathy (HCC)    with myelomalacia at C5-6 and C3-4  . CHF (congestive heart failure) ([place])   . Constipation - functional    controlled with miralax  . COPD (chronic obstructive pulmonary disease) (HCC)    mild  . DJD (degenerative joint disease)    WHOLE SPINE  . Dyspnea   . Gait disorder   . Heart murmur    years  ago   . Hypertension   . Hypothyroidism    Low d/t treatment of hyperthyroidism  . Lumbosacral spinal stenosis 03/13/2014  . On home oxygen therapy    "2.5L all the time" (06/29/2018)  . Peripheral neuropathy   . Pneumonia   . PONV (postoperative nausea and vomiting)   . RBBB   . Renal disorder    acute kidney failure  . Restless legs syndrome 03/14/2015  .  RLS (restless legs syndrome)   . Sleep apnea    DX  2009/2010 study done at [facility]  . 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 Type II diabetes mellitus ([place])    diet controlled    Past Surgical History:  Procedure Laterality Date  . BACK SURGERY     6 surgeries  . CARDIAC CATHETERIZATION [name] 09/07/2015   Procedure: Right/Left Heart Cath and Coronary Angiography;  Surgeon: Peter M Martinique, MD;  Location: [place] [facility];  Service: Cardiovascular;  Laterality: [name];  . CHOLECYSTECTOMY    . EYE SURGERY     BIL CATARACTS  . LUMBAR LAMINECTOMY/DECOMPRESSION MICRODISCECTOMY  07/14/2012   Procedure: LUMBAR LAMINECTOMY/DECOMPRESSION MICRODISCECTOMY 2 LEVELS;  Surgeon: Eustace Moore, MD;  Location: [place] [facility] NEURO ORS;  Service: Neurosurgery;  Laterality: Left;  Lumbar two three laminectomy, left thoracic four five transpedicular diskectomy  . POSTERIOR CERVICAL FUSION/FORAMINOTOMY  04/08/2018   Procedure: Laminectomy and Foraminotomy - Lumbar Four-Lumbar Five, instrumented posterolateral fusion Lumbar Four- Five, Laminectomy and Foraminotomy - Thoracic One-Thoracic Two;  Surgeon: Eustace Moore, MD;  Location: [facility] OR;  Service: Neurosurgery;;  posterior  . POSTERIOR LUMBAR FUSION  04/08/2018   Laminectomy and Foraminotomy - Lumbar Four-Lumbar Five, instrumented posterolateral fusion Lumbar Four- Five, Laminectomy and Foraminotomy - Thoracic One-Thoracic Two  . POSTERIOR LUMBAR FUSION  04/08/2018   Laminectomy and Foraminotomy - Lumbar Four-Lumbar Five, instrumented posterolateral fusion Lumbar Four- Five, Laminectomy and Foraminotomy - Thoracic One-Thoracic Two    Social History:  reports that he quit smoking about 33 years ago. His smoking use included cigarettes. He has a 40.00 pack-year smoking history. He quit smokeless tobacco use about 33 years ago. He reports that he does not drink alcohol or use drugs.  Family History:  Family History  Problem Relation Age of Onset  . Polycythemia Mother   .  Diabetes Father   . Hypertension Father   . Heart disease Father   . CAD Other        1 of his 4 brothers and father has CAD     Prior to Admission medications   Medication Sig Start Date End Date Taking? Authorizing Provider  aspirin EC 81 MG EC tablet Take 1 tablet (81 mg total) by mouth daily. 08/23/15  Yes Thurnell Lose, MD  atorvastatin (LIPITOR) 20 MG tablet Take 1 tablet (20 mg total) by mouth daily. Patient taking differently: Take 20 mg by mouth daily at 6 PM.  01/05/19 04/30/28 Yes Georgiana Shore, NP  escitalopram (LEXAPRO) 20 MG tablet Take 20 mg by mouth daily.   Yes [provider]  gabapentin (NEURONTIN) 400 MG capsule TAKE 1 CAPSULE (400 MG TOTAL) BY MOUTH EVERY 8 (EIGHT) HOURS. Patient taking differently: Take 400 mg by mouth 3 (three) times daily.  05/12/18  Yes Angiulli, Lavon Paganini, [name]  hydrALAZINE (APRESOLINE) 50 MG tablet Take 1.5 tablets (75 mg total) by mouth 3 (three) times daily. 07/23/18 04/30/28 Yes Weaver, Scott T, [name]  insulin aspart (NOVOLOG FLEXPEN) 100 UNIT/ML FlexPen Inject  18 Units into the skin 3 (three) times daily with meals. 03/07/19  Yes Shamleffer, Melanie Crazier, MD  Insulin Glargine (LANTUS SOLOSTAR) 100 UNIT/ML Solostar Pen Inject 60 Units into the skin at bedtime. 03/04/19  Yes Shamleffer, Melanie Crazier, MD  levothyroxine (SYNTHROID, LEVOTHROID) 175 MCG tablet Take 1 tablet (175 mcg total) by mouth daily. 05/12/18  Yes Angiulli, Lavon Paganini, [name]  metolazone (ZAROXOLYN) 2.5 MG tablet Take 1 tablet (2.5 mg total) by mouth once a week. Fridays and additional as directed by heart failure clinic Patient taking differently: Take 2.5 mg by mouth every Friday.  04/11/19  Yes Georgiana Shore, NP  metoprolol tartrate (LOPRESSOR) 25 MG tablet Take 0.5 tablets (12.5 mg total) by mouth daily. Patient taking differently: Take 12.5 mg by mouth 2 (two) times daily.  08/16/18  Yes Clegg, Amy D, NP  polyethylene glycol (MIRALAX / GLYCOLAX) packet Take 17 g by mouth  daily as needed. For constipation Patient taking differently: Take 17 g by mouth at bedtime.  08/09/14  Yes Love, Ivan Anchors, [name]  pramipexole (MIRAPEX) 0.5 MG tablet One tablet in the morning and 2 in the evening Patient taking differently: Take 0.5-1 mg by mouth See admin instructions. Take 1 tablet in the morning and take 1 or 2 tablets every evening 11/24/18  Yes Kathrynn Ducking, MD  spironolactone (ALDACTONE) 25 MG tablet Take 1 tablet (25 mg total) by mouth daily. 10/29/18  Yes Nahser, Wonda Cheng, MD  tamsulosin (FLOMAX) 0.4 MG CAPS capsule Take 1 capsule (0.4 mg total) by mouth daily. Patient taking differently: Take 0.4 mg by mouth at bedtime.  05/12/18  Yes Angiulli, Lavon Paganini, [name]  torsemide (DEMADEX) 20 MG tablet Take 5 tablets (100 mg total) by mouth 2 (two) times daily. Patient taking differently: Take 80 mg by mouth 2 (two) times daily.  04/11/19  Yes Georgiana Shore, NP  TOVIAZ 8 MG TB24 tablet Take 1 tablet (8 mg total) by mouth daily. 05/12/18  Yes Angiulli, Lavon Paganini, [name]  Blood Glucose Monitoring Suppl (ONETOUCH VERIO) w/Device KIT 1 kit by Does not apply route 4 (four) times daily. Use as directed to test blood sugars 4 times daily DX E11.42 03/04/19   Shamleffer, Melanie Crazier, MD  Continuous Blood Gluc Receiver (FREESTYLE LIBRE 14 DAY READER) DEVI 1 kit by Does not apply route 3 (three) times daily. 02/07/19   Shamleffer, Melanie Crazier, MD  Continuous Blood Gluc Sensor (FREESTYLE LIBRE 14 DAY SENSOR) MISC 1 kit by Does not apply route 3 (three) times daily. 02/07/19   Shamleffer, Melanie Crazier, MD  docusate sodium (COLACE) 100 MG capsule Take 200 mg by mouth at bedtime.    [provider]  glucose blood test strip Use as instructed to test blood sugars 4 times daily DX E11.42 03/04/19   Shamleffer, Melanie Crazier, MD  insulin degludec (TRESIBA FLEXTOUCH) 100 UNIT/ML SOPN FlexTouch Pen Inject 0.6 mLs (60 Units total) into the skin daily. 04/15/19   Shamleffer, Melanie Crazier,  MD  Insulin Pen Needle (BD PEN NEEDLE MICRO U/F) 32G X 6 MM MISC Four times daily 02/04/19   Shamleffer, Melanie Crazier, MD  Insulin Pen Needle (BD PEN NEEDLE MICRO U/F) 32G X 6 MM MISC 4 times a day 03/04/19   Shamleffer, Melanie Crazier, MD  ketoconazole (NIZORAL) 2 % cream Apply 1 application topically daily. 12/03/18   Shirley Friar, [name]  Lancets [facility] ULTRASOFT) lancets Use as instructed to test blood sugars 4 times daily DX E11.42 03/04/19  Shamleffer, Melanie Crazier, MD  multivitamin (ONE-A-DAY MEN'S) TABS tablet Take 1 tablet by mouth at bedtime.    [provider]  potassium chloride SA (K-DUR,KLOR-CON) 20 MEQ tablet Take 1 tablet (20 mEq total) by mouth as directed. Take 1 tab only on days you take Metolazone 04/11/19   Georgiana Shore, NP    Physical Exam: Vitals:   05/01/19 0215 05/01/19 0216 05/01/19 0330 05/01/19 0345  BP: 123/63  (!) 106/43 125/71  Pulse: 71  72 68  Resp: (!) 30  17 (!) 26  Temp:  98.1 F (36.7 C)    SpO2: 94%  93% 95%  Weight:      Height:       General: Not in acute distress. Has anasarca HEENT:       Eyes: PERRL, EOMI, no scleral icterus.       ENT: No discharge from the ears and nose, no pharynx injection, no tonsillar enlargement.        Neck: Difficult to assess JVD due to morbid obesity, no bruit, no mass felt. Heme: No neck lymph node enlargement. Cardiac: S1/S2, RRR, No murmurs, No gallops or rubs. Respiratory: Decreased air movement bilaterally.  No rales, wheezing, rhonchi or rubs. GI: Soft, nondistended, nontender, no rebound pain, no organomegaly, BS present. GU: No hematuria Ext: 4+ pitting leg edema bilaterally. 1+DP/PT pulse bilaterally. Musculoskeletal: No joint deformities, No joint redness or warmth, no limitation of ROM in spin. Skin: No rashes. Has tenderness, warmth, erythema and swelling in both lower legs. Neuro: Alert, oriented X3, cranial nerves II-XII grossly intact, moves all extremities normally.   Psych: Patient is not psychotic, no suicidal or hemocidal ideation.  Labs on Admission: I have personally reviewed following labs and imaging studies  CBC: Recent Labs  Lab 05/01/19 0123  WBC 12.4*  NEUTROABS 11.0*  HGB 13.1  HCT 41.1  MCV 91.5  PLT 619   Basic Metabolic Panel: Recent Labs  Lab 05/01/19 0123  NA 140  K 4.2  CL 93*  CO2 33*  GLUCOSE 219*  BUN 68*  CREATININE 1.94*  CALCIUM 9.3   GFR: Estimated Creatinine Clearance: 55.2 mL/min (A) (by C-G formula based on SCr of 1.94 mg/dL (H)). Liver Function Tests: No results for input(s): AST, ALT, ALKPHOS, BILITOT, PROT, ALBUMIN in the last 168 hours. No results for input(s): LIPASE, AMYLASE in the last 168 hours. No results for input(s): AMMONIA in the last 168 hours. Coagulation Profile: No results for input(s): INR, PROTIME in the last 168 hours. Cardiac Enzymes: Recent Labs  Lab 05/01/19 0123  TROPONINI <0.03   BNP (last 3 results) No results for input(s): PROBNP in the last 8760 hours. HbA1C: No results for input(s): HGBA1C in the last 72 hours. CBG: No results for input(s): GLUCAP in the last 168 hours. Lipid Profile: No results for input(s): CHOL, HDL, LDLCALC, TRIG, CHOLHDL, LDLDIRECT in the last 72 hours. Thyroid Function Tests: No results for input(s): TSH, T4TOTAL, FREET4, T3FREE, THYROIDAB in the last 72 hours. Anemia Panel: No results for input(s): VITAMINB12, FOLATE, FERRITIN, TIBC, IRON, RETICCTPCT in the last 72 hours. Urine analysis:    Component Value Date/Time   COLORURINE STRAW (A) 05/01/2019 0258   APPEARANCEUR CLEAR 05/01/2019 0258   LABSPEC 1.009 05/01/2019 0258   PHURINE 5.0 05/01/2019 0258   GLUCOSEU 150 (A) 05/01/2019 0258   HGBUR NEGATIVE 05/01/2019 0258   BILIRUBINUR NEGATIVE 05/01/2019 0258   KETONESUR NEGATIVE 05/01/2019 0258   PROTEINUR NEGATIVE 05/01/2019 0258   UROBILINOGEN 0.2  08/22/2015 1820   NITRITE NEGATIVE 05/01/2019 0258   LEUKOCYTESUR MODERATE (A)  05/01/2019 0258   Sepsis Labs: '@LABRCNTIP'$ (procalcitonin:4,lacticidven:4) )No results found for this or any previous visit (from the past 240 hour(s)).   Radiological Exams on Admission: Dg Chest Portable 1 View  Result Date: 05/01/2019 CLINICAL DATA:  Shortness of breath EXAM: PORTABLE CHEST 1 VIEW COMPARISON:  11/29/2018 FINDINGS: Very low lung volumes. Bibasilar atelectasis. Vascular congestion. No visible effusions or acute bony abnormality. IMPRESSION: Very low lung volumes with bibasilar atelectasis and vascular congestion. Electronically Signed   By: Rolm Baptise M.D.   On: 05/01/2019 00:50     EKG: Independently reviewed.  Not done in ED, will get one.   Assessment/Plan Principal Problem:   Acute on chronic diastolic CHF (congestive heart failure) (HCC) Active Problems:   Sepsis (HCC)   OSA on CPAP   Hypothyroid   Stage 3 chronic kidney disease (HCC)   Benign essential HTN   Type 2 diabetes mellitus with stage 3 chronic kidney disease, with long-term current use of insulin (HCC)   Anasarca   Cellulitis of lower extremity   COPD (chronic obstructive pulmonary disease) (HCC)   Depression   RLS (restless legs syndrome)   Acute on chronic diastolic CHF (congestive heart failure) ([place]): pt has SOB and anasarca.  Chest x-ray showed pulmonary vascular congestion, consistent with CHF exacerbation.  2D echo on 06/29/2018 showed EF of 55 to 60% with grade 1 diastolic dysfunction.  -will admit to tele bed as inpt. -Lasix 40 mg IV x 1-->need to reevaluate to decide if to continue IV diuretics since patient has sepsis. -2d echo -Daily weights -strict I/O's -Low salt diet -Fluid restriction  Sepsis due to bilateral lower extremity cellulitis and UTI: Patient admitted critical for sepsis with leukocytosis and lactic apnea.  Lactic acid is pending.  Currently hemodynamically stable. -on vancomycin and Zosyn -f/u Bx and Ux -CRP and ESR -will get Procalcitonin and trend lactic acid  levels per sepsis protocol. -IVF: will hold off IVF since pt has CHF exacerbation and anasarca -get LE doppler to r/o DVT  UTI: has positive UA and typical symptoms for UTI.  Patient has history of a positive urine culture for Pseudomonas -On vancomycin and Zosyn as above -Follow-up urine culture  OSA:  -on CPAP (COVID19 test negative)   Hypothyroid: -Continue Synthroid  Stage 3 chronic kidney disease ([place]): stable.  Creatinine 1.8-1.9 to baseline.  His creatinine is 1.94, BUN 68 -f/u by BMP  Benign essential HTN: -Hold hydralazine since pt is at risk of developing hypotension secondary to sepsis -Continue metoprolol -IV hydralazine as needed  Type 2 diabetes mellitus with stage 3 chronic kidney disease, with long-term current use of insulin ([place]): Last A1c 13.3 on 02/04/19, poorly controled. Patient is NovoLog and Lantus taking at home -will decrease Lantus dose from 60 to 30 unit daily -SSI  COPD (chronic obstructive pulmonary disease) ([place]): on 2L nasal cannula oxygen at home. -Atrovent inhaler, PRN albuterol inhaler, PRN Mucinex  Depression: -Lexapro  RLS (restless legs syndrome): -Pramipexole   COVID subjective risk assessment:   Physician PPE: 95 mask, gown, glove Patient LPF:[id number]  Fever: no Cough: no SOB: yes URI symptoms: yes GI symptoms: no Travel: no Sick contacts: no CBC: leukopenia, lymphopenia-->no BMP: increased BUN/Cr=68/1.94 LFTs: increased AST/ALT/Tbili-->pending CRP, LDH: increased-->pending Procalcitonin: pending CXR: hazy bilateral peripheral opacities--> no CT chest: GGO, consolidation, crazy paving-->not done COVID subjective risk assessment: low COVID Testing: indicated per current ID/[place] guidelines-->yes Precaution: Droplet and contact  Addendum: COVID 19 test negative    Inpatient status:  # Patient requires inpatient status due to high intensity of service, high risk for further deterioration and high frequency of  surveillance required.  I certify that at the point of admission it is my clinical judgment that the patient will require inpatient hospital care spanning beyond 2 midnights from the point of admission.  . This patient has multiple chronic comorbidities including hypertension, hyperlipidemia, diabetes mellitus, COPD on 2 L nasal cannula oxygen at home, hypothyroidism, depression, CHF, right bundle blockage, RLS, OSA on CPAP, dementia, CKD stage III . Now patient has presenting with CHF exacerbation with anasarca, sepsis due to bilateral lower leg cellulitis and UTI . The worrisome physical exam findings include anasarca, has tenderness, erythema, warmth and swelling in both legs. . The initial radiographic and laboratory data are worrisome because of leukocytosis, positive urinalysis, . Current medical needs: please see my assessment and plan . Predictability of an adverse outcome (risk): Patient is multiple comorbidities, now presents with CHF exacerbation with anasarca.  Also has sepsis due to bilateral lower leg cellulitis and UTI.  Patient's presentation is highly complicated, at high risk for deteriorating.  Will need to be treated in hospital for at least 2 days.     DVT ppx: SQ Lovenox Code Status: Full code Family Communication: None at bed side.      Disposition Plan:  Anticipate discharge back to previous home environment Consults called:  none Admission status:   Inpatient/tele     Date of Service 05/01/2019    [place] [facility]   If 7PM-7AM, please contact night-coverage [email or web address] Password [email or web address] 05/01/2019, 4:00 AM

## 2019-05-01 NOTE — Progress Notes (Signed)
Patient wife call for an update and to confirm patient home BiPAP was present along with cell phone clothes and shoes.  R/T at bedside decrease 02 L from 4Lto 2.5L at home baseline.

## 2019-05-01 NOTE — Progress Notes (Signed)
  Echocardiogram 2D Echocardiogram with definity has been performed.  Ramin Zoll L Androw 05/01/2019, 4:18 PM

## 2019-05-01 NOTE — ED Notes (Signed)
Tylenol given

## 2019-05-01 NOTE — Progress Notes (Signed)
Bilateral lower extremity venous duplex has been completed. Preliminary results can be found in CV Proc through chart review.   05/01/19 12:29 PM Travis [name] RVT

## 2019-05-01 NOTE — Progress Notes (Signed)
Pt has home CPAP but missing piece to face mask.  RT placed pt on one of the hospital CPAP with 2L of 02 bleed in.  Pt tolerating well.

## 2019-05-01 NOTE — ED Notes (Signed)
The pt has extrme edema in both his legs and his abdomen is distended that the  Wife reports is ujsual for him

## 2019-05-01 NOTE — ED Provider Notes (Signed)
[facility] EMERGENCY DEPARTMENT Provider Note   CSN: [id number] Arrival date & time: 04/30/19  2135    History   Chief Complaint Chief Complaint  Patient presents with  . Shortness of Breath    HPI Travis [name] is a 73 y.o. male.     The history is provided by the EMS personnel. The history is limited by the condition of the patient.  Shortness of Breath  Severity:  Moderate Onset quality:  Gradual Timing:  Constant Progression:  Unchanged Chronicity:  New Context: not URI   Relieved by:  Nothing Worsened by:  Nothing Ineffective treatments:  None tried Associated symptoms: no abdominal pain, no chest pain, no cough, no diaphoresis, no fever and no sore throat   Risk factors: no recent alcohol use   Patient thinks he is here for a picc line for UTI.    Past Medical History:  Diagnosis Date  . Cervical myelopathy (HCC)    with myelomalacia at C5-6 and C3-4  . CHF (congestive heart failure) ([place])   . Constipation - functional    controlled with miralax  . COPD (chronic obstructive pulmonary disease) (HCC)    mild  . DJD (degenerative joint disease)    WHOLE SPINE  . Dyspnea   . Gait disorder   . Heart murmur    years ago   . Hypertension   . Hypothyroidism    Low d/t treatment of hyperthyroidism  . Lumbosacral spinal stenosis 03/13/2014  . On home oxygen therapy    "2.5L all the time" (06/29/2018)  . Peripheral neuropathy   . Pneumonia   . PONV (postoperative nausea and vomiting)   . RBBB   . Renal disorder    acute kidney failure  . Restless legs syndrome 03/14/2015  . RLS (restless legs syndrome)   . Sleep apnea    DX  2009/2010 study done at [facility]  . 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 Type II diabetes mellitus ([place])    diet controlled    Patient Active Problem List   Diagnosis Date Noted  . Type 2 diabetes mellitus with stage 3 chronic kidney disease, with long-term current use of insulin ([place]) 04/15/2019  . Poorly  controlled type 2 diabetes mellitus with peripheral neuropathy ([place]) 04/15/2019  . Pressure injury of skin 11/21/2018  . Complicated urinary tract infection 11/20/2018  . Acute encephalopathy 06/28/2018  . Oxygen desaturation   . Supplemental oxygen dependent   . Type 2 diabetes mellitus with hyperglycemia, with long-term current use of insulin ([place])   . Labile blood pressure   . Labile blood glucose   . Myelopathy ([place]) 04/16/2018  . Benign essential HTN   . Benign prostatic hyperplasia with urinary retention   . Acute on chronic respiratory failure with hypoxia and hypercapnia (HCC)   . DDD (degenerative disc disease), cervical   . Morbid obesity ([place])   . S/P lumbar spinal fusion 04/08/2018  . 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 Myelopathy, spondylogenic, cervical 11/11/2017  . Excessive daytime sleepiness 11/11/2017  . 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 Debility 10/08/2017  . Stage 3 chronic kidney disease ([place])   . Spinal stenosis of lumbar region   . Acute blood loss anemia   . Urinary tract infection without hematuria   . Chronic diastolic CHF (congestive heart failure) ([place])   . Elevated troponin I level   . Hypothyroid 08/19/2015  . Restless legs  syndrome 03/14/2015  . Shortness of breath 03/09/2015  . Sinus bradycardia 09/21/2014  . Thoracic spondylosis with myelopathy 09/13/2014  . Cervical arthritis with myelopathy 07/18/2014  . Constipation - functional   . Sepsis ([place]) 07/10/2014  . OSA on CPAP 07/10/2014  . Edema leg 07/10/2014  . Lumbosacral spinal stenosis 03/13/2014  . Other vitamin B12 deficiency anemia 07/13/2013  . 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 Polyneuropathy in other diseases classified elsewhere ([place]) 02/17/2013  . Other general symptoms(780.99) 02/17/2013  . Abnormality of gait 02/17/2013  . Cervical spondylosis with myelopathy 02/17/2013  . Back ache 09/15/2012     Past Surgical History:  Procedure Laterality Date  . BACK SURGERY     6 surgeries  . CARDIAC CATHETERIZATION [name] 09/07/2015   Procedure: Right/Left Heart Cath and Coronary Angiography;  Surgeon: Peter M Martinique, MD;  Location: [facility] CV LAB;  Service: Cardiovascular;  Laterality: [name];  . CHOLECYSTECTOMY    . EYE SURGERY     BIL CATARACTS  . LUMBAR LAMINECTOMY/DECOMPRESSION MICRODISCECTOMY  07/14/2012   Procedure: LUMBAR LAMINECTOMY/DECOMPRESSION MICRODISCECTOMY 2 LEVELS;  Surgeon: Eustace Moore, MD;  Location: [facility];  Service: Neurosurgery;  Laterality: Left;  Lumbar two three laminectomy, left thoracic four five transpedicular diskectomy  . POSTERIOR CERVICAL FUSION/FORAMINOTOMY  04/08/2018   Procedure: Laminectomy and Foraminotomy - Lumbar Four-Lumbar Five, instrumented posterolateral fusion Lumbar Four- Five, Laminectomy and Foraminotomy - Thoracic One-Thoracic Two;  Surgeon: Eustace Moore, MD;  Location: [facility];  Service: Neurosurgery;;  posterior  . POSTERIOR LUMBAR FUSION  04/08/2018   Laminectomy and Foraminotomy - Lumbar Four-Lumbar Five, instrumented posterolateral fusion Lumbar Four- Five, Laminectomy and Foraminotomy - Thoracic One-Thoracic Two  . POSTERIOR LUMBAR FUSION  04/08/2018   Laminectomy and Foraminotomy - Lumbar Four-Lumbar Five, instrumented posterolateral fusion Lumbar Four- Five, Laminectomy and Foraminotomy - Thoracic One-Thoracic Two        Home Medications    Prior to Admission medications   Medication Sig Start Date End Date Taking? Authorizing Provider  aspirin EC 81 MG EC tablet Take 1 tablet (81 mg total) by mouth daily. 08/23/15  Yes Thurnell Lose, MD  atorvastatin (LIPITOR) 20 MG tablet Take 1 tablet (20 mg total) by mouth daily. Patient taking differently: Take 20 mg by mouth daily at 6 PM.  01/05/19 04/30/28 Yes Georgiana Shore, NP  escitalopram (LEXAPRO) 20 MG tablet Take 20 mg by mouth daily.   Yes [provider]  gabapentin  (NEURONTIN) 400 MG capsule TAKE 1 CAPSULE (400 MG TOTAL) BY MOUTH EVERY 8 (EIGHT) HOURS. Patient taking differently: Take 400 mg by mouth 3 (three) times daily.  05/12/18  Yes Angiulli, Lavon Paganini, [name]  hydrALAZINE (APRESOLINE) 50 MG tablet Take 1.5 tablets (75 mg total) by mouth 3 (three) times daily. 07/23/18 04/30/28 Yes Weaver, Scott T, [name]  insulin aspart (NOVOLOG FLEXPEN) 100 UNIT/ML FlexPen Inject 18 Units into the skin 3 (three) times daily with meals. 03/07/19  Yes Shamleffer, Melanie Crazier, MD  Insulin Glargine (LANTUS SOLOSTAR) 100 UNIT/ML Solostar Pen Inject 60 Units into the skin at bedtime. 03/04/19  Yes Shamleffer, Melanie Crazier, MD  levothyroxine (SYNTHROID, LEVOTHROID) 175 MCG tablet Take 1 tablet (175 mcg total) by mouth daily. 05/12/18  Yes Angiulli, Lavon Paganini, [name]  metolazone (ZAROXOLYN) 2.5 MG tablet Take 1 tablet (2.5 mg total) by mouth once a week. Fridays and additional as directed by heart failure clinic Patient taking differently: Take 2.5 mg by mouth every Friday.  04/11/19  Yes Georgiana Shore, NP  metoprolol tartrate (LOPRESSOR) 25 MG tablet Take 0.5 tablets (12.5 mg total) by mouth daily. Patient taking differently: Take 12.5 mg by mouth 2 (two) times daily.  08/16/18  Yes Clegg, Amy D, NP  polyethylene glycol (MIRALAX / GLYCOLAX) packet Take 17 g by mouth daily as needed. For constipation Patient taking differently: Take 17 g by mouth at bedtime.  08/09/14  Yes Love, Ivan Anchors, [name]  pramipexole (MIRAPEX) 0.5 MG tablet One tablet in the morning and 2 in the evening Patient taking differently: Take 0.5-1 mg by mouth See admin instructions. Take 1 tablet in the morning and take 1 or 2 tablets every evening 11/24/18  Yes Kathrynn Ducking, MD  spironolactone (ALDACTONE) 25 MG tablet Take 1 tablet (25 mg total) by mouth daily. 10/29/18  Yes Nahser, Wonda Cheng, MD  tamsulosin (FLOMAX) 0.4 MG CAPS capsule Take 1 capsule (0.4 mg total) by mouth daily. Patient taking differently: Take 0.4  mg by mouth at bedtime.  05/12/18  Yes Angiulli, Lavon Paganini, [name]  torsemide (DEMADEX) 20 MG tablet Take 5 tablets (100 mg total) by mouth 2 (two) times daily. Patient taking differently: Take 80 mg by mouth 2 (two) times daily.  04/11/19  Yes Georgiana Shore, NP  TOVIAZ 8 MG TB24 tablet Take 1 tablet (8 mg total) by mouth daily. 05/12/18  Yes Angiulli, Lavon Paganini, [name]  Blood Glucose Monitoring Suppl (ONETOUCH VERIO) w/Device KIT 1 kit by Does not apply route 4 (four) times daily. Use as directed to test blood sugars 4 times daily DX E11.42 03/04/19   Shamleffer, Melanie Crazier, MD  Continuous Blood Gluc Receiver (FREESTYLE LIBRE 14 DAY READER) DEVI 1 kit by Does not apply route 3 (three) times daily. 02/07/19   Shamleffer, Melanie Crazier, MD  Continuous Blood Gluc Sensor (FREESTYLE LIBRE 14 DAY SENSOR) MISC 1 kit by Does not apply route 3 (three) times daily. 02/07/19   Shamleffer, Melanie Crazier, MD  docusate sodium (COLACE) 100 MG capsule Take 200 mg by mouth at bedtime.    [provider]  glucose blood test strip Use as instructed to test blood sugars 4 times daily DX E11.42 03/04/19   Shamleffer, Melanie Crazier, MD  insulin degludec (TRESIBA FLEXTOUCH) 100 UNIT/ML SOPN FlexTouch Pen Inject 0.6 mLs (60 Units total) into the skin daily. 04/15/19   Shamleffer, Melanie Crazier, MD  Insulin Pen Needle (BD PEN NEEDLE MICRO U/F) 32G X 6 MM MISC Four times daily 02/04/19   Shamleffer, Melanie Crazier, MD  Insulin Pen Needle (BD PEN NEEDLE MICRO U/F) 32G X 6 MM MISC 4 times a day 03/04/19   Shamleffer, Melanie Crazier, MD  ketoconazole (NIZORAL) 2 % cream Apply 1 application topically daily. 12/03/18   Shirley Friar, [name]  Lancets [facility] ULTRASOFT) lancets Use as instructed to test blood sugars 4 times daily DX E11.42 03/04/19   Shamleffer, Melanie Crazier, MD  multivitamin (ONE-A-DAY MEN'S) TABS tablet Take 1 tablet by mouth at bedtime.    [provider]  potassium chloride SA  (K-DUR,KLOR-CON) 20 MEQ tablet Take 1 tablet (20 mEq total) by mouth as directed. Take 1 tab only on days you take Metolazone 04/11/19   Georgiana Shore, NP    Family History Family History  Problem Relation Age of Onset  . Polycythemia Mother   . Diabetes Father   . Hypertension Father   . Heart disease Father   . CAD Other  1 of his 4 brothers and father has CAD    Social History Social History   Tobacco Use  . Smoking status: Former Smoker    Packs/day: 2.00    Years: 20.00    Pack years: 40.00    Types: Cigarettes    Last attempt to quit: 06/28/1985    Years since quitting: 33.8  . Smokeless tobacco: Former Systems developer    Quit date: 06/28/1985  Substance Use Topics  . Alcohol use: No  . Drug use: No     Allergies   Amlodipine; Plendil [felodipine]; Compazine [prochlorperazine]; Cymbalta [duloxetine hcl]; Jardiance [empagliflozin]; Other; Levofloxacin in d5w; and Septra [sulfamethoxazole-trimethoprim]   Review of Systems Review of Systems  Unable to perform ROS: Dementia  Constitutional: Negative for diaphoresis and fever.  HENT: Negative for sore throat.   Respiratory: Positive for shortness of breath. Negative for cough and choking.   Cardiovascular: Positive for leg swelling. Negative for chest pain and palpitations.  Gastrointestinal: Negative for abdominal pain.  Genitourinary: Negative for dysuria.     Physical Exam Updated Vital Signs BP 120/62   Pulse 72   Temp 98.1 F (36.7 C)   Resp (!) 25   Ht 6' 1"  (1.854 m)   Wt (!) 172.4 kg   SpO2 94%   BMI 50.13 kg/m   Physical Exam Constitutional:      Appearance: He is obese. He is not diaphoretic.  HENT:     Head: Normocephalic and atraumatic.     Nose: Nose normal.  Eyes:     Conjunctiva/sclera: Conjunctivae normal.     Pupils: Pupils are equal, round, and reactive to light.  Neck:     Musculoskeletal: Normal range of motion and neck supple.  Cardiovascular:     Rate and Rhythm: Normal rate and  regular rhythm.     Pulses: Normal pulses.     Heart sounds: Normal heart sounds.  Pulmonary:     Breath sounds: Rales present.  Abdominal:     General: Abdomen is flat. Bowel sounds are normal.     Tenderness: There is no abdominal tenderness. There is no guarding.  Musculoskeletal:        General: No tenderness or deformity.     Right lower leg: Edema present.     Left lower leg: Edema present.  Skin:    General: Skin is warm and dry.     Capillary Refill: Capillary refill takes less than 2 seconds.     Findings: Erythema present.  Neurological:     General: No focal deficit present.     Mental Status: He is alert.     Deep Tendon Reflexes: Reflexes normal.  Psychiatric:        Mood and Affect: Mood normal.        Behavior: Behavior normal.      ED Treatments / Results  Labs (all labs ordered are listed, but only abnormal results are displayed) Results for orders placed or performed during the hospital encounter of 04/30/19  CBC with Differential/Platelet  Result Value Ref Range   WBC 12.4 (H) 4.0 - 10.5 K/uL   RBC 4.49 4.22 - 5.81 MIL/uL   Hemoglobin 13.1 13.0 - 17.0 g/dL   HCT 41.1 39.0 - 52.0 %   MCV 91.5 80.0 - 100.0 fL   MCH 29.2 26.0 - 34.0 pg   MCHC 31.9 30.0 - 36.0 g/dL   RDW 15.1 11.5 - 15.5 %   Platelets 195 150 - 400 K/uL   nRBC  0.0 0.0 - 0.2 %   Neutrophils Relative % 88 %   Neutro Abs 11.0 (H) 1.7 - 7.7 K/uL   Lymphocytes Relative 6 %   Lymphs Abs 0.7 0.7 - 4.0 K/uL   Monocytes Relative 5 %   Monocytes Absolute 0.6 0.1 - 1.0 K/uL   Eosinophils Relative 0 %   Eosinophils Absolute 0.0 0.0 - 0.5 K/uL   Basophils Relative 0 %   Basophils Absolute 0.0 0.0 - 0.1 K/uL   Immature Granulocytes 1 %   Abs Immature Granulocytes 0.06 0.00 - 0.07 K/uL  Basic metabolic panel  Result Value Ref Range   Sodium 140 135 - 145 mmol/L   Potassium 4.2 3.5 - 5.1 mmol/L   Chloride 93 (L) 98 - 111 mmol/L   CO2 33 (H) 22 - 32 mmol/L   Glucose, Bld 219 (H) 70 - 99  mg/dL   BUN 68 (H) 8 - 23 mg/dL   Creatinine, Ser 1.94 (H) 0.61 - 1.24 mg/dL   Calcium 9.3 8.9 - 10.3 mg/dL   GFR calc non Af Amer 33 (L) >60 mL/min   GFR calc Af Amer 38 (L) >60 mL/min   Anion gap 14 5 - 15  Troponin I - ONCE - STAT  Result Value Ref Range   Troponin I <0.03 <0.03 ng/mL  Brain natriuretic peptide  Result Value Ref Range   B Natriuretic Peptide 87.6 0.0 - 100.0 pg/mL   Dg Chest Portable 1 View  Result Date: 05/01/2019 CLINICAL DATA:  Shortness of breath EXAM: PORTABLE CHEST 1 VIEW COMPARISON:  11/29/2018 FINDINGS: Very low lung volumes. Bibasilar atelectasis. Vascular congestion. No visible effusions or acute bony abnormality. IMPRESSION: Very low lung volumes with bibasilar atelectasis and vascular congestion. Electronically Signed   By: Rolm Baptise M.D.   On: 05/01/2019 00:50    EKG  EKG Interpretation  Date/Time:  Sunday May 01 2019 03:09:54 EDT Ventricular Rate:  74 PR Interval:    QRS Duration: 153 QT Interval:  446 QTC Calculation: 495 R Axis:   32 Text Interpretation:  Sinus rhythm Right bundle branch block Confirmed by Randal Buba, Aki Burdin ([id number]) on 05/01/2019 3:13:53 AM       Radiology Dg Chest Portable 1 View  Result Date: 05/01/2019 CLINICAL DATA:  Shortness of breath EXAM: PORTABLE CHEST 1 VIEW COMPARISON:  11/29/2018 FINDINGS: Very low lung volumes. Bibasilar atelectasis. Vascular congestion. No visible effusions or acute bony abnormality. IMPRESSION: Very low lung volumes with bibasilar atelectasis and vascular congestion. Electronically Signed   By: Rolm Baptise M.D.   On: 05/01/2019 00:50    Procedures Procedures (including critical care time)  Medications Ordered in ED Medications  vancomycin (VANCOCIN) IVPB 1000 mg/200 mL premix (has no administration in time range)  piperacillin-tazobactam (ZOSYN) IVPB 3.375 g (has no administration in time range)  albuterol (VENTOLIN HFA) 108 (90 Base) MCG/ACT inhaler 8 puff (has no administration in time  range)  acetaminophen (TYLENOL) tablet 650 mg (650 mg Oral Given 05/01/19 0057)  furosemide (LASIX) injection 40 mg (40 mg Intravenous Given 05/01/19 0119)     Initial Impression / Assessment and Plan / ED Course  I have reviewed the triage vital signs and the nursing notes.  Pertinent labs & imaging results that were available during my care of the patient were reviewed by me and considered in my medical decision making (see chart for details).    Volume overload with BLE cellulitis.    Final Clinical Impressions(s) / ED Diagnoses   Final diagnoses:  Anasarca  Renal insufficiency  Cellulitis of lower extremity, unspecified laterality  Dyspnea, unspecified type    Admit to medicine   Geral Coker, MD 05/01/19 9702

## 2019-05-01 NOTE — ED Notes (Signed)
wifes home phone number  [phone]

## 2019-05-01 NOTE — Progress Notes (Signed)
Pharmacy Antibiotic Note  Travis [name] is a 73 y.o. male admitted on 04/30/2019 with cellulitis and UTI with hx of pseudomonas.  Pharmacy has been consulted for vancomycin and zosyn dosing. CKDIII - SCr 1.94 (BL ~ 1.6-2)  Plan: Vancomycin 2000mg  IV x1, then 1500mg  IV every 24 hours (calc AUC 487, SCr 1.94) Zosyn 3.375g IV every 8 hours Monitor renal function, Cx and clinical progression to narrow Vancomycin levels at steady state  Height: 6\' 1"  (185.4 cm) Weight: (!) 380 lb (172.4 kg) IBW/kg (Calculated) : 79.9  Temp (24hrs), Avg:98.1 F (36.7 C), Min:98.1 F (36.7 C), Max:98.1 F (36.7 C)  Recent Labs  Lab 05/01/19 0123  WBC 12.4*  CREATININE 1.94*    Estimated Creatinine Clearance: 55.2 mL/min (A) (by C-G formula based on SCr of 1.94 mg/dL (H)).    Allergies  Allergen Reactions  . Amlodipine Swelling and Other (See Comments)    Causes edema (per [facility] paperwork)  . Plendil [Felodipine] Swelling and Other (See Comments)    Causes edema (per [facility] paperwork)  . Compazine [Prochlorperazine] Other (See Comments)    Caused agitation (per [facility] paperwork)  . Cymbalta [Duloxetine Hcl] Other (See Comments)    Made the patient "feel weird," per [facility] paperwork  . Jardiance [Empagliflozin] Other (See Comments)    Stopped by MD because of unwanted side effects  . Other Nausea Only and Other (See Comments)    ANESTHESIA UNSPECIFIED- makes sick, must have anti-nausea medication in advance  . Levofloxacin In D5w Itching    Possible infusion reaction 03/29/17. Tolerating oral Levaquin 03/30/17  . Septra [Sulfamethoxazole-Trimethoprim] Nausea Only and Other (See Comments)    Per [facility] paperwork    Antimicrobials this admission: Vanc 5/3>> Zosyn 5/3>>  Dose adjustments this admission: n/a  Microbiology results: 5/3 BCx: sent 5/3 UCx: sent 5/3 COVID: sent   Bertis Ruddy, PharmD Clinical Pharmacist Please check AMION  for all [place] numbers 05/01/2019 3:45 AM

## 2019-05-02 ENCOUNTER — Other Ambulatory Visit (HOSPITAL_COMMUNITY): Payer: Self-pay

## 2019-05-02 DIAGNOSIS — N183 Chronic kidney disease, stage 3 (moderate): Secondary | ICD-10-CM

## 2019-05-02 DIAGNOSIS — I5033 Acute on chronic diastolic (congestive) heart failure: Secondary | ICD-10-CM

## 2019-05-02 LAB — CBC WITH DIFFERENTIAL/PLATELET
Abs Immature Granulocytes: 0.04 10*3/uL (ref 0.00–0.07)
Basophils Absolute: 0 10*3/uL (ref 0.0–0.1)
Basophils Relative: 0 %
Eosinophils Absolute: 0.2 10*3/uL (ref 0.0–0.5)
Eosinophils Relative: 2 %
HCT: 39.1 % (ref 39.0–52.0)
Hemoglobin: 12.6 g/dL — ABNORMAL LOW (ref 13.0–17.0)
Immature Granulocytes: 0 %
Lymphocytes Relative: 8 %
Lymphs Abs: 0.7 10*3/uL (ref 0.7–4.0)
MCH: 29.3 pg (ref 26.0–34.0)
MCHC: 32.2 g/dL (ref 30.0–36.0)
MCV: 90.9 fL (ref 80.0–100.0)
Monocytes Absolute: 0.6 10*3/uL (ref 0.1–1.0)
Monocytes Relative: 6 %
Neutro Abs: 7.6 10*3/uL (ref 1.7–7.7)
Neutrophils Relative %: 84 %
Platelets: 182 10*3/uL (ref 150–400)
RBC: 4.3 MIL/uL (ref 4.22–5.81)
RDW: 15 % (ref 11.5–15.5)
WBC: 9.2 10*3/uL (ref 4.0–10.5)
nRBC: 0 % (ref 0.0–0.2)

## 2019-05-02 LAB — GLUCOSE, CAPILLARY
Glucose-Capillary: 199 mg/dL — ABNORMAL HIGH (ref 70–99)
Glucose-Capillary: 272 mg/dL — ABNORMAL HIGH (ref 70–99)
Glucose-Capillary: 293 mg/dL — ABNORMAL HIGH (ref 70–99)
Glucose-Capillary: 231 mg/dL — ABNORMAL HIGH (ref 70–99)

## 2019-05-02 LAB — BASIC METABOLIC PANEL
Anion gap: 11 (ref 5–15)
BUN: 63 mg/dL — ABNORMAL HIGH (ref 8–23)
CO2: 37 mmol/L — ABNORMAL HIGH (ref 22–32)
Calcium: 9.2 mg/dL (ref 8.9–10.3)
Chloride: 92 mmol/L — ABNORMAL LOW (ref 98–111)
Creatinine, Ser: 2.01 mg/dL — ABNORMAL HIGH (ref 0.61–1.24)
GFR calc Af Amer: 37 mL/min — ABNORMAL LOW (ref >60)
GFR calc non Af Amer: 32 mL/min — ABNORMAL LOW (ref >60)
Glucose, Bld: 197 mg/dL — ABNORMAL HIGH (ref 70–99)
Potassium: 3.6 mmol/L (ref 3.5–5.1)
Sodium: 140 mmol/L (ref 135–145)

## 2019-05-02 LAB — HEMOGLOBIN A1C
Hgb A1c MFr Bld: 10 % — ABNORMAL HIGH (ref 4.8–5.6)
Mean Plasma Glucose: 240.3 mg/dL

## 2019-05-02 LAB — URINE CULTURE

## 2019-05-02 LAB — MAGNESIUM: Magnesium: 2.4 mg/dL (ref 1.7–2.4)

## 2019-05-02 MED ORDER — IPRATROPIUM BROMIDE HFA 17 MCG/ACT IN AERS
2.0000 | INHALATION_SPRAY | Freq: Two times a day (BID) | RESPIRATORY_TRACT | Status: DC
  Administered 2019-05-02 – 2019-05-08 (×12): 2 via RESPIRATORY_TRACT
  Filled 2019-05-02: qty 12.9

## 2019-05-02 MED ORDER — PRO-STAT SUGAR FREE PO LIQD
30.0000 mL | Freq: Two times a day (BID) | ORAL | Status: DC
  Filled 2019-05-02 (×4): qty 30

## 2019-05-02 MED ORDER — POTASSIUM CHLORIDE CRYS ER 20 MEQ PO TBCR: 20.0000 meq | EXTENDED_RELEASE_TABLET | ORAL | 3 refills | Status: DC

## 2019-05-02 MED ORDER — FUROSEMIDE 10 MG/ML IJ SOLN
10.0000 mg/h | INTRAVENOUS | Status: AC
  Administered 2019-05-02: 10 mg/h via INTRAVENOUS
  Filled 2019-05-02: qty 25

## 2019-05-02 MED ORDER — TRAZODONE HCL 50 MG PO TABS
50.0000 mg | ORAL_TABLET | Freq: Every evening | ORAL | Status: DC | PRN
  Administered 2019-05-02 – 2019-05-07 (×3): 50 mg via ORAL
  Filled 2019-05-02 (×3): qty 1

## 2019-05-02 NOTE — Evaluation (Signed)
Physical Therapy Evaluation Patient Details Name: Travis [name] MRN: [id number] DOB: 1946/06/12 Today's Date: 05/02/2019   History of Present Illness  73 y.o. male admitted with edema, SOB, CHF exacerbation. PMHx: HTN, HLD, DM, COPD on 2L at home, hypothyroidism, depression, CHF, RT BBB, RLS, OSA on CPAP, dementia, CKD stage III  Clinical Impression  Pt pleasant and very willing to get OOB today. Pt reports he has been using a RW and WC for several months but wishes to return to cane. Pt with significant weakness requiring assist for all mobility with impaired balance, function and gait who will benefit from acute therapy to maximize mobility, function, safety and strength. Pt reports he and family do not desire SNF but wife is the only person at home all this time as son works. Pt is currently not safe to mobilize with only 1 person assist and highly recommend SNF. However, if pt continues to refuse HHPT with 24hr assist recommended.   SpO2 87-89% on 5L throughout session, 92% at rest, HR 71-82    Follow Up Recommendations SNF;Supervision/Assistance - 24 hour;Home health PT(pt states he and family will refuse SNF)    Equipment Recommendations  None recommended by PT    Recommendations for Other Services OT consult     Precautions / Restrictions Precautions Precautions: Fall Restrictions Weight Bearing Restrictions: No      Mobility  Bed Mobility Overal bed mobility: Needs Assistance Bed Mobility: Supine to Sit     Supine to sit: Mod assist;HOB elevated     General bed mobility comments: HOB 30 degrees with use of rail and mod assist to pivot pelvis and lower body to eOB as well as to elevate trunk  Transfers Overall transfer level: Needs assistance   Transfers: Sit to/from Stand Sit to Stand: Mod assist;From elevated surface         General transfer comment: mod assist to stand from elevated bed with cues for anterior translation and assist to rise. Mod +2 to  control descent to chair as pt sitting prematurely  Ambulation/Gait Ambulation/Gait assistance: Min assist;+2 safety/equipment Gait Distance (Feet): 15 Feet Assistive device: Rolling walker (2 wheeled) Gait Pattern/deviations: Step-to pattern;Trunk flexed;Wide base of support;Decreased stride length   Gait velocity interpretation: <1.8 ft/sec, indicate of risk for recurrent falls General Gait Details: pt with maintained trunk flexion with wide BOS unable to maintain feet in RW, left foot drag with gait.  Pt with assist to steer and advance RW with pt directing distance. However, upon return to chair pt fatigued and sitting prematurely with mod +2 to control descent to chair  Stairs            Wheelchair Mobility    Modified Rankin (Stroke Patients Only)       Balance Overall balance assessment: Needs assistance   Sitting balance-Leahy Scale: Fair     Standing balance support: Bilateral upper extremity supported Standing balance-Leahy Scale: Poor Standing balance comment: reliant on bil UE support for standing                             Pertinent Vitals/Pain Pain Assessment: No/denies pain    Home Living Family/patient expects to be discharged to:: Private residence Living Arrangements: Spouse/significant other Available Help at Discharge: Family;Available 24 hours/day;Personal care attendant Type of Home: House Home Access: Ramped entrance     Home Layout: One level Home Equipment: [place] - 2 wheels;Walker - 4 wheels;Cane - single point;Bedside commode;Wheelchair -  manual;Shower seat - built in;Grab bars - tub/shower;Hospital bed;Other (comment);Grab bars - toilet Additional Comments: 2x/wk aide for 1 hour.    Prior Function Level of Independence: Needs assistance   Gait / Transfers Assistance Needed: pt reports he was walking to the bathroom, W/C for longer distances with ability to transfer with min assist  ADL's / Homemaking Assistance Needed: Pt  wife assists with ADL's including bathing and dressing        Hand Dominance        Extremity/Trunk Assessment   Upper Extremity Assessment Upper Extremity Assessment: Generalized weakness    Lower Extremity Assessment Lower Extremity Assessment: Generalized weakness    Cervical / Trunk Assessment Cervical / Trunk Assessment: Kyphotic  Communication   Communication: No difficulties  Cognition Arousal/Alertness: Awake/alert Behavior During Therapy: WFL for tasks assessed/performed Overall Cognitive Status: Impaired/Different from baseline Area of Impairment: Safety/judgement;Problem solving                         Safety/Judgement: Decreased awareness of safety;Decreased awareness of deficits   Problem Solving: Slow processing;Decreased initiation        General Comments      Exercises     Assessment/Plan    PT Assessment Patient needs continued PT services  PT Problem List Decreased strength;Decreased balance;Decreased cognition;Decreased activity tolerance;Decreased mobility;Decreased knowledge of use of DME;Obesity       PT Treatment Interventions Gait training;Therapeutic activities;Therapeutic exercise;DME instruction;Functional mobility training;Balance training;Patient/family education    PT Goals (Current goals can be found in the Care Plan section)  Acute Rehab PT Goals Patient Stated Goal: return home and be able to walk with my cane PT Goal Formulation: With patient Time For Goal Achievement: 05/16/19 Potential to Achieve Goals: Fair    Frequency Min 3X/week   Barriers to discharge Decreased caregiver support      Co-evaluation               AM-PAC PT "6 Clicks" Mobility  Outcome Measure Help needed turning from your back to your side while in a flat bed without using bedrails?: A Lot Help needed moving from lying on your back to sitting on the side of a flat bed without using bedrails?: A Lot Help needed moving to and  from a bed to a chair (including a wheelchair)?: A Lot Help needed standing up from a chair using your arms (e.g., wheelchair or bedside chair)?: A Lot Help needed to walk in hospital room?: A Lot Help needed climbing 3-5 steps with a railing? : Total 6 Click Score: 11    End of Session Equipment Utilized During Treatment: Gait belt;Oxygen Activity Tolerance: Patient tolerated treatment well Patient left: in chair;with call bell/phone within reach;with nursing/sitter in room;with chair alarm set Nurse Communication: Mobility status;Precautions PT Visit Diagnosis: Other abnormalities of gait and mobility (R26.89);Muscle weakness (generalized) (M62.81);Unsteadiness on feet (R26.81);Difficulty in walking, not elsewhere classified (R26.2)    Time: 2595-6387 PT Time Calculation (min) (ACUTE ONLY): 25 min   Charges:   PT Evaluation $PT Eval Moderate Complexity: 1 Mod PT Treatments $Therapeutic Activity: 8-22 mins        Declyn Delsol Pam Drown, PT Acute Rehabilitation Services Pager: [phone] Office: [place] 05/02/2019, 12:58 PM

## 2019-05-02 NOTE — Progress Notes (Signed)
Orthopedic Tech Progress Note Patient Details:  Travis [name] 1946-02-10 [id number]  Ortho Devices Type of Ortho Device: Haematologist Ortho Device/Splint Location: bilateral Ortho Device/Splint Interventions: Adjustment, Application, Ordered   Post Interventions Patient Tolerated: Well Instructions Provided: Care of device, Adjustment of device   Janit Pagan 05/02/2019, 12:57 PM

## 2019-05-02 NOTE — Discharge Instructions (Signed)

## 2019-05-02 NOTE — TOC Initial Note (Signed)
Transition of Care [facility]) - Initial/Assessment Note    Patient Details  Name: Travis [name] MRN: [id number] Date of Birth: Jul 16, 1946  Transition of Care [facility]) CM/SW Contact:    Sherrilyn Rist Phone Number: [phone] 05/02/2019, 2:38 PM  Clinical Narrative:                 Patient lives at home with spouse; PCP: [name], MD; has private insurance with [employer] with prescription drug coverage; pharmacy of choice is [facility]; DME - rollator at home; is active with [facility] services as prior to admission. Patient is refusing SNF at this time and wants to go home with resumption of [facility] services. Tiffany with [facility] made aware of admission.  Expected Discharge Plan: [place] Barriers to Discharge: No Barriers Identified   Patient Goals and CMS Choice Patient states their goals for this hospitalization and ongoing recovery are:: to stay at home CMS Medicare.gov Compare Post Acute Care list provided to:: Patient    Expected Discharge Plan and Services Expected Discharge Plan: [place] In-house Referral: NA Discharge Planning Services: CM Consult Post Acute Care Choice: [place] arrangements for the past 2 months: Single Family Home                 DME Arranged: [name] DME Agency: NA       HH Arranged: Disease Management, RN, PT [place] Agency: [facility] [employer] Date [place]: 05/02/19 Time [place]: 1437 Representative spoke with at [place]: Tiffany RN  Prior Living Arrangements/Services Living arrangements for the past 2 months: [place] with:: Spouse Patient language and need for interpreter reviewed:: Yes Do you feel safe going back to the place where you live?: Yes      Need for Family Participation in Patient Care: No (Comment) Care giver support system in place?: Yes (comment)   Criminal  Activity/Legal Involvement Pertinent to Current Situation/Hospitalization: No - Comment as needed  Activities of Daily Living Home Assistive Devices/Equipment: Walker (specify type), CPAP, CBG Meter, Nebulizer, Oxygen ADL Screening (condition at time of admission) Patient's cognitive ability adequate to safely complete daily activities?: Yes Is the patient deaf or have difficulty hearing?: No Does the patient have difficulty seeing, even when wearing glasses/contacts?: No Does the patient have difficulty concentrating, remembering, or making decisions?: Yes Patient able to express need for assistance with ADLs?: Yes Does the patient have difficulty dressing or bathing?: Yes Independently performs ADLs?: No Communication: Independent Dressing (OT): Needs assistance Grooming: Needs assistance Feeding: Needs assistance Bathing: Needs assistance Toileting: Needs assistance In/Out Bed: Needs assistance Walks in Home: Needs assistance Is this a change from baseline?: Pre-admission baseline Does the patient have difficulty walking or climbing stairs?: Yes Weakness of Legs: Both Weakness of Arms/Hands: None  Permission Sought/Granted Permission sought to share information with : Case Manager Permission granted to share information with : Yes, Verbal Permission Granted  Share Information with NAME: spouse  Permission granted to share info w AGENCY: [place] agency        Emotional Assessment Appearance:: Developmentally appropriate Attitude/Demeanor/Rapport: Gracious Affect (typically observed): Accepting Orientation: : Oriented to Self, Oriented to  Time, Oriented to Place, Oriented to Situation Alcohol / Substance Use: Not Applicable Psych Involvement: No (comment)  Admission diagnosis:  Anasarca [R60.1] Renal insufficiency [N28.9] Acute cystitis without hematuria [N30.00] Cellulitis of lower extremity, unspecified laterality [L03.119] Dyspnea, unspecified type [R06.00]  Patient  Active Problem List   Diagnosis Date Noted  . Anasarca 05/01/2019  . Cellulitis of lower extremity 05/01/2019  . COPD (chronic obstructive pulmonary disease) ([place]) 05/01/2019  . Depression 05/01/2019  . RLS (restless legs syndrome) 05/01/2019  . Type 2 diabetes mellitus with stage 3 chronic kidney disease, with long-term current use of insulin ([place]) 04/15/2019  . Insulin dependent diabetes mellitus ([place]) 04/15/2019  . Pressure injury of skin 11/21/2018  . Complicated urinary tract infection 11/20/2018  . Acute encephalopathy 06/28/2018  . Oxygen desaturation   . Supplemental oxygen dependent   . Type 2 diabetes mellitus with hyperglycemia, with long-term current use of insulin ([place])   . Labile blood pressure   . Labile blood glucose   . Myelopathy ([place]) 04/16/2018  . Benign essential HTN   . Benign prostatic hyperplasia with urinary retention   . Acute on chronic respiratory failure with hypoxia and hypercapnia (HCC)   . DDD (degenerative disc disease), cervical   . Morbid obesity ([place])   . S/P lumbar spinal fusion 04/08/2018  . Paraparesis of both lower limbs ([place]) 11/11/2017  . Myelopathy, spondylogenic, cervical 11/11/2017  . Excessive daytime sleepiness 11/11/2017  . 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 Tetraparesis ([place]) 10/08/2017  . Debility 10/08/2017  . Stage 3 chronic kidney disease ([place])   . Spinal stenosis of lumbar region   . Acute blood loss anemia   . Urinary tract infection without hematuria   . Acute on chronic diastolic CHF (congestive heart failure) ([place])   . Elevated troponin I level   . Hypothyroid 08/19/2015  . Restless legs syndrome 03/14/2015  . Shortness of breath 03/09/2015  . Sinus bradycardia 09/21/2014  . Thoracic spondylosis with myelopathy 09/13/2014  . Cervical arthritis with myelopathy 07/18/2014  . Constipation - functional   . Sepsis ([place]) 07/10/2014  . OSA on CPAP 07/10/2014  . Edema leg 07/10/2014  . Lumbosacral spinal stenosis  03/13/2014  . Other vitamin B12 deficiency anemia 07/13/2013  . 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 Polyneuropathy in other diseases classified elsewhere ([place]) 02/17/2013  . Other general symptoms(780.99) 02/17/2013  . Abnormality of gait 02/17/2013  . Cervical spondylosis with myelopathy 02/17/2013  . Back ache 09/15/2012   PCP:  [name], MD Pharmacy:   [place], [place] - [address] [place] [address] Phone: [phone] Fax: [phone]  [place], [place] [address] Phone: [phone] Fax: [phone]     Social Determinants of Health (SDOH) Interventions    Readmission Risk Interventions No flowsheet data found.

## 2019-05-02 NOTE — Progress Notes (Addendum)
Inpatient Diabetes Program Recommendations  AACE/ADA: New Consensus Statement on Inpatient Glycemic Control (2015)  Target Ranges:  Prepandial:   less than 140 mg/dL      Peak postprandial:   less than 180 mg/dL (1-2 hours)      Critically ill patients:  140 - 180 mg/dL   Results for Travis [name], Travis [name] (MRN [id number]) as of 05/02/2019 09:12  Ref. Range 05/01/2019 06:46 05/01/2019 11:00 05/01/2019 16:25 05/01/2019 20:54  Glucose-Capillary Latest Ref Range: 70 - 99 mg/dL 235 (H)  3 units NOVOLOG  322 (H)  7 units NOVOLOG  183 (H)  2 units NOVOLOG  244 (H)    30 units LANTUS   Results for Travis [name], Travis [name] (MRN [id number]) as of 05/02/2019 09:12  Ref. Range 05/02/2019 06:49  Glucose-Capillary Latest Ref Range: 70 - 99 mg/dL 199 (H)  2 units NOVOLOG    Results for Travis [name], Travis [name] (MRN [id number]) as of 05/02/2019 09:12  Ref. Range 11/22/2018 05:06 02/04/2019 09:33 05/02/2019 05:52  Hemoglobin A1C Latest Ref Range: 4.8 - 5.6 % 11.2 (H) 13.3 (A) 10.0 (H)  (240 mg/dl)   Admit with: SOB/ CHF/ Sepsis due to Bilateral LE Cellulitis and UTI  History: DM, COPD, CHF, CKD  Home DM Meds: Lantus 60 units QHS       Novolog 18 units TID  Current Orders: Lantus 30 units QHS      Novolog Sensitive Correction Scale/ SSI (0-9 units) TID AC     Endocrinologist: Dr. Kelton Pillar with [facility]--Last seen by Telemedicine visit on 04/15/2019.  Per ENDO notes, only checking CBGs once per day in the AM.  Was prescribed the Freestyle Libre CGM system, but per ENDO notes, pt never picked up the RX and started the CGM.  Was instructed by the ENDO on 04/15/2019 to increase his CBG checks to 4 times per day TID AC + HS and To Increase his Lantus to 60 units QHS and Increase his Novolog to 18 units TID at that visit.      MD- Please consider the following in-hospital insulin adjustments:  1. Increase Lantus to 35 units QHS (fasting CBG 199 this AM)  2. Start Novolog Meal Coverage: Novolog 6 units TID  with meals (1/3 total home dose)  (Please add the following Hold Parameters: Hold if pt eats <50% of meal, Hold if pt NPO)   Addendum 2pm- Called and spoke with pt by phone to discuss current A1c of 10% and also to discuss last ENDO visit as an outpatient.  DM Coordinator working remotely due to [employer] pandemic.  Pt confirmed he last spoke with his ENDO on 04/15/2019 (was an E-Visit).  Lantus dose was increased to 60 units QHS and Novolog dose was increased to 18 units TID at that visit.  Pt stated he gives himself his Novolog and his wife usually helps him with his Lantus.  States he does not miss doses.  Has follow up appt with ENDO in June (could not recall the exact date).  I asked pt if he ever picked up the Freestyle Libre CGM but pt told me they went to get it and the pharmacy didn't have it.  Never followed up.  I discussed with pt why the Freestyle Libre CGM is a benefit to have as it eliminates the need for fingersticks and can be worn for 14 days.  Also explained to pt that the CGM system can help keep better track of his CBGs and he can scan as often as he needs  to at home for [facility] data.  Pt seemed moderately interested in following back up with the pharmacy.  Discussed with pt that he has successfully reduced his A1c from 13.3% down to 10% this admit.  Congratulated pt on his hard work and reminded him that we need to strive for an A1c closer to 7% to prevent further vascular damage related to high CBGs.    I attempted to call pt's wife to discuss but I could not reach her by phone.      --Will follow patient during hospitalization--  Wyn Quaker RN, MSN, CDE Diabetes Coordinator Inpatient Glycemic Control Team Team Pager: [phone] (8a-5p)

## 2019-05-02 NOTE — Consult Note (Signed)
Advanced Heart Failure Team Consult Note   Primary Physician: Travis Arabian, MD PCP-Cardiologist:  Travis Moores, MD  Reason for Consultation: A/C diastolic HF  HPI:    Travis [name] is seen today for evaluation of A/C diastolic HF at the request of Dr Erlinda Hong.   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  Admitted 04/08/2018 for decompression lumber laminectomy. Hospital course was complicated by respiratory failure and severe deconditioning. He was discharged to [facility] and went home on 05/12/2018.   Admitted 7/1 through 07/04/18. Admitted with AMS due to CO2 retention/hypercarbic respiratory failure due to CO2 retention and A/C diastolic heart failure. BB decreased in the hospital due to bradycardic. Discharge weight 275 pounds.  Admitted 11/23 - 28/00 with complicated UTI (UCx with Pseudomonas). AKI noted on labs. Treated with IV ABX.   He had a virtual visit in HF clinic 4/13 and had 10 lb weight gain that was not improving with increased torsemide. He was instructed to take metolazone x 2 days in a row and increase torsemide to 100 mg BID. He was also [facility] for PT, RN, ReDS protocol, and unna boots.   He had another visit on 4/16 and was doing much better. He was unable to weigh because he was feeling weak. [facility] RN did a visit with him on 4/17 and ReDS reading was 24% (normal 25-35%).  He presented to [facility] 04/30/19 with BLE edema and SOB. Noted to have anasarca and erythematous legs. Also complained of dysuria and increased urinary frequency.    Pertinent admission labs include: creatinine 1.94, BNP 87, troponin <0.03, PCT <10, WBC 12.4, sed rate 58, CRP 3.6, lactic acid 1.1, D-dimer 0.91, COVID-19 negative, Blood cx NGTD, UA+ mod leukocytes (cx with multiple species present, suggesting recollection). LE dopplers negative CXR: Very low  lung volumes with bibasilar atelectasis and vascular congestion.  Admitted for treatment of volume overload and sepsis due to BLE cellulitis and UTI. He was started on Vanc, Zosyn, and PO doxy. He was started in lasix drip at 10 mg/hr. Echo was repeated, which showed EF 60-65% and normal RV.  He diuresed over 3 L yesterday. Creatinine is stable 2.0 and weight is down 2 lbs. He has been afebrile. BP stable 120s. O2 sats in low 90s on 4 L O2.   Cardiac Studies:  Echo 05/01/19:  1. The left ventricle has normal systolic function, with an ejection fraction of 60-65%. Left ventricular diastolic Doppler parameters are consistent with indeterminate diastolic dysfunction. No evidence of left ventricular regional wall motion  abnormalities.  2. The right ventricle has normal systolc function. The cavity was normal. There is no increase in right ventricular wall thickness. Right ventricular systolic pressure could not be assessed.  3. Left atrial size was not well visualized.  4. The mitral valve is grossly normal.  5. The aortic valve is tricuspid.  6. The aortic root is normal in size and structure.  7. The inferior vena cava was dilated in size with <50% respiratory variability.  8. The interatrial septum was not well visualized.  PYP scan 11/26/18 with Grade 1, H/CLL 1.45. Equivocal. Personally reviewed by Dr. [name]. Negative.  Echocardiogram 06/29/2018 EF 55-60, normal wall motion, grade 1 diastolic dysfunction Echocardiogram 03/06/17 Mild LVH, EF 65-70, normal wall motion, grade 2 diastolic dysfunction, mild LAE Echo 08/21/15 EF 55-60, normal wall motion, grade 2 diastolic  dysfunction, mild LAE  R/L cardiac catheterization 09/07/15 LM 40 LAD distal 40 LCx ostial 50 RCA proximal 40 EF 55-60 LVEDP 16, mean RA 4, PASP 45, mean PCWP 13  Nuclear stress test 03/19/15 EF 48, no ischemia, low risk  Review of Systems: [y] = yes, [ ] = no   . General: Weight gain Blue.Reese ]; Weight loss [ ];  Anorexia [ ]; Fatigue [ ]; Fever [ ]; Chills [ ]; Weakness Blue.Reese ]  . Cardiac: Chest pain/pressure [ ]; Resting SOB [ ]; Exertional SOB Blue.Reese ]; Pontianus.Latina [ ]; Pedal Edema Blue.Reese ]; Palpitations [ ]; Syncope [ ]; Presyncope [ ]; Paroxysmal nocturnal dyspnea[ ]  . Pulmonary: Cough [ ]; Wheezing[ ]; Hemoptysis[ ]; Sputum [ ]; Snoring [ ]  . GI: Vomiting[ ]; Dysphagia[ ]; Melena[ ]; Hematochezia [ ]; Heartburn[ ]; Abdominal pain [ ]; Constipation [ ]; Diarrhea [ ]; BRBPR [ ]  . GU: Hematuria[ ]; Dysuria Blue.Reese ]; Nocturia[ ]  . Vascular: Pain in legs with walking Blue.Reese ]; Pain in feet with lying flat [ ]; Non-healing sores [ ]; Stroke [ ]; TIA [ ]; Slurred speech [ ];  . Neuro: Headaches[ ]; Vertigo[ ]; Seizures[ ]; Paresthesias[ ];Blurred vision [ ]; Diplopia [ ]; Vision changes [ ]  . Ortho/Skin: Arthritis Blue.Reese ]; Joint pain [y]; Muscle pain [ ]; Joint swelling [ ]; Back Pain [ ]; Rash [ ]  . Psych: Depression[ ]; Anxiety[ ]  . Heme: Bleeding problems [ ]; Clotting disorders [ ]; Anemia [ ]  . Endocrine: Diabetes Blue.Reese ]; Thyroid dysfunction[ ]  Home Medications Prior to Admission medications   Medication Sig Start Date End Date Taking? Authorizing Provider  atorvastatin (LIPITOR) 20 MG tablet Take 1 tablet (20 mg total) by mouth daily. Patient taking differently: Take 20 mg by mouth daily at 6 PM.  01/05/19 04/30/28 Yes Georgiana Shore, NP  escitalopram (LEXAPRO) 20 MG tablet Take 20 mg by mouth daily.   Yes [provider]  gabapentin (NEURONTIN) 400 MG capsule TAKE 1 CAPSULE (400 MG TOTAL) BY MOUTH EVERY 8 (EIGHT) HOURS. Patient taking differently: Take 400 mg by mouth 3 (three) times daily.  05/12/18  Yes Angiulli, Lavon Paganini, [name]  hydrALAZINE (APRESOLINE) 50 MG tablet Take 1.5 tablets (75 mg total) by mouth 3 (three) times daily. 07/23/18 04/30/28 Yes Weaver, Scott T, [name]  insulin aspart (NOVOLOG FLEXPEN) 100 UNIT/ML FlexPen Inject 18 Units into the skin 3 (three) times daily with meals. 03/07/19  Yes Shamleffer,  Melanie Crazier, MD  Insulin Glargine (LANTUS SOLOSTAR) 100 UNIT/ML Solostar Pen Inject 60 Units into the skin at bedtime. 03/04/19  Yes Shamleffer, Melanie Crazier, MD  ketoconazole (NIZORAL) 2 % cream Apply 1 application topically daily. 12/03/18  Yes Shirley Friar, [name]  levothyroxine (SYNTHROID, LEVOTHROID) 175 MCG tablet Take 1 tablet (175 mcg total) by mouth daily. 05/12/18  Yes Angiulli, Lavon Paganini, [name]  metolazone (ZAROXOLYN) 2.5 MG tablet Take 1 tablet (2.5 mg total) by mouth once a week. Fridays and additional as directed by heart failure clinic Patient taking differently: Take 2.5 mg by mouth every Saturday.  04/11/19  Yes Georgiana Shore, NP  metoprolol tartrate (LOPRESSOR) 25 MG tablet Take 0.5 tablets (12.5 mg total) by mouth daily. Patient taking differently: Take 12.5 mg by mouth 2 (two) times daily.  08/16/18  Yes Clegg, Amy D, NP  multivitamin (ONE-A-DAY MEN'S) TABS tablet Take 1 tablet by mouth at bedtime.   Yes [provider]  polyethylene glycol (MIRALAX / GLYCOLAX) packet Take 17 g by mouth daily as needed. For constipation Patient taking differently: Take 17 g by mouth at bedtime.  08/09/14  Yes Love, Ivan Anchors, [name]  potassium chloride SA (K-DUR,KLOR-CON) 20 MEQ tablet Take 1 tablet (20 mEq total) by mouth as directed. Take 1 tab only on days you take Metolazone 04/11/19  Yes Georgiana Shore, NP  pramipexole (MIRAPEX) 0.5 MG tablet One tablet in the morning and 2 in the evening Patient taking differently: Take 0.5-1 mg by mouth See admin instructions. Take 1 tablet in the morning and take 1 or 2 tablets every evening 11/24/18  Yes Kathrynn Ducking, MD  spironolactone (ALDACTONE) 25 MG tablet Take 1 tablet (25 mg total) by mouth daily. 10/29/18  Yes Nahser, Wonda Cheng, MD  tamsulosin (FLOMAX) 0.4 MG CAPS capsule Take 1 capsule (0.4 mg total) by mouth daily. Patient taking differently: Take 0.4 mg by mouth at bedtime.  05/12/18  Yes Angiulli, Lavon Paganini, [name]  torsemide  (DEMADEX) 20 MG tablet Take 5 tablets (100 mg total) by mouth 2 (two) times daily. Patient taking differently: Take 80 mg by mouth 2 (two) times daily.  04/11/19  Yes Georgiana Shore, NP  TOVIAZ 8 MG TB24 tablet Take 1 tablet (8 mg total) by mouth daily. 05/12/18  Yes Angiulli, Lavon Paganini, [name]  Blood Glucose Monitoring Suppl (ONETOUCH VERIO) w/Device KIT 1 kit by Does not apply route 4 (four) times daily. Use as directed to test blood sugars 4 times daily DX E11.42 03/04/19   Shamleffer, Melanie Crazier, MD  Continuous Blood Gluc Receiver (FREESTYLE LIBRE 14 DAY READER) DEVI 1 kit by Does not apply route 3 (three) times daily. 02/07/19   Shamleffer, Melanie Crazier, MD  Continuous Blood Gluc Sensor (FREESTYLE LIBRE 14 DAY SENSOR) MISC 1 kit by Does not apply route 3 (three) times daily. 02/07/19   Shamleffer, Melanie Crazier, MD  glucose blood test strip Use as instructed to test blood sugars 4 times daily DX E11.42 03/04/19   Shamleffer, Melanie Crazier, MD  Insulin Pen Needle (BD PEN NEEDLE MICRO U/F) 32G X 6 MM MISC Four times daily 02/04/19   Shamleffer, Melanie Crazier, MD  Insulin Pen Needle (BD PEN NEEDLE MICRO U/F) 32G X 6 MM MISC 4 times a day 03/04/19   Shamleffer, Melanie Crazier, MD  Lancets [facility] ULTRASOFT) lancets Use as instructed to test blood sugars 4 times daily DX E11.42 03/04/19   Shamleffer, Melanie Crazier, MD    Past Medical History: Past Medical History:  Diagnosis Date  . Cervical myelopathy (HCC)    with myelomalacia at C5-6 and C3-4  . CHF (congestive heart failure) ([place])   . Constipation - functional    controlled with miralax  . COPD (chronic obstructive pulmonary disease) (HCC)    mild  . DJD (degenerative joint disease)    WHOLE SPINE  . Dyspnea   . Gait disorder   . Heart murmur    years ago   . Hypertension   . Hypothyroidism    Low d/t treatment of hyperthyroidism  . Lumbosacral spinal stenosis 03/13/2014  . On home oxygen therapy    "2.5L all the time"  (06/29/2018)  . Peripheral neuropathy   . Pneumonia   . PONV (postoperative nausea and vomiting)   . RBBB   . Renal disorder    acute kidney failure  . Restless legs syndrome 03/14/2015  . RLS (restless legs syndrome)   . Sleep  apnea    DX  2009/[facility]  . 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 Type II diabetes mellitus ([place])    diet controlled    Past Surgical History: Past Surgical History:  Procedure Laterality Date  . BACK SURGERY     6 surgeries  . CARDIAC CATHETERIZATION [name] 09/07/2015   Procedure: Right/Left Heart Cath and Coronary Angiography;  Surgeon: Peter M Martinique, MD;  Location: [facility];  Service: Cardiovascular;  Laterality: [name];  . CHOLECYSTECTOMY    . EYE SURGERY     BIL CATARACTS  . LUMBAR LAMINECTOMY/DECOMPRESSION MICRODISCECTOMY  07/14/2012   Procedure: LUMBAR LAMINECTOMY/DECOMPRESSION MICRODISCECTOMY 2 LEVELS;  Surgeon: Eustace Moore, MD;  Location: [place] [facility];  Service: Neurosurgery;  Laterality: Left;  Lumbar two three laminectomy, left thoracic four five transpedicular diskectomy  . POSTERIOR CERVICAL FUSION/FORAMINOTOMY  04/08/2018   Procedure: Laminectomy and Foraminotomy - Lumbar Four-Lumbar Five, instrumented posterolateral fusion Lumbar Four- Five, Laminectomy and Foraminotomy - Thoracic One-Thoracic Two;  Surgeon: Eustace Moore, MD;  Location: [facility] OR;  Service: Neurosurgery;;  posterior  . POSTERIOR LUMBAR FUSION  04/08/2018   Laminectomy and Foraminotomy - Lumbar Four-Lumbar Five, instrumented posterolateral fusion Lumbar Four- Five, Laminectomy and Foraminotomy - Thoracic One-Thoracic Two  . POSTERIOR LUMBAR FUSION  04/08/2018   Laminectomy and Foraminotomy - Lumbar Four-Lumbar Five, instrumented posterolateral fusion Lumbar Four- Five, Laminectomy and Foraminotomy - Thoracic One-Thoracic Two    Family History: Family History  Problem Relation Age of Onset  . Polycythemia Mother   . Diabetes Father   . Hypertension Father    . Heart disease Father   . CAD Other        1 of his 4 brothers and father has CAD    Social History: Social History   Socioeconomic History  . Marital status: Married    Spouse name: carolyn  . Number of children: 3  . Years of education: 96  . Highest education level: Not on file  Occupational History    Employer: [employer]: Works in [employer]  . Financial resource strain: Not on file  . Food insecurity:    Worry: Not on file    Inability: Not on file  . Transportation needs:    Medical: Not on file    Non-medical: Not on file  Tobacco Use  . Smoking status: Former Smoker    Packs/day: 2.00    Years: 20.00    Pack years: 40.00    Types: Cigarettes    Last attempt to quit: 06/28/1985    Years since quitting: 33.8  . Smokeless tobacco: Former Systems developer    Quit date: 06/28/1985  Substance and Sexual Activity  . Alcohol use: No  . Drug use: No  . Sexual activity: Not on file  Lifestyle  . Physical activity:    Days per week: Not on file    Minutes per session: Not on file  . Stress: Not on file  Relationships  . Social connections:    Talks on phone: Not on file    Gets together: Not on file    Attends religious service: Not on file    Active member of club or organization: Not on file    Attends meetings of clubs or organizations: Not on file    Relationship status: Not on file  Other Topics Concern  . Not on file  Social History Narrative  Lives with wife,  [name]   Patient is married with 3 children.   Patient has 16 yrs of education.   Patient is right handed.   Patient drinks 2 cups of caffeine per day.    Allergies:  Allergies  Allergen Reactions  . Amlodipine Swelling and Other (See Comments)    Causes edema (per [facility] paperwork)  . Plendil [Felodipine] Swelling and Other (See Comments)    Causes edema (per [facility] paperwork)  . Compazine [Prochlorperazine] Other (See Comments)     Caused agitation (per [facility] paperwork)  . Cymbalta [Duloxetine Hcl] Other (See Comments)    Made the patient "feel weird," per [facility] paperwork  . Jardiance [Empagliflozin] Other (See Comments)    Stopped by MD because of unwanted side effects  . Other Nausea Only and Other (See Comments)    ANESTHESIA UNSPECIFIED- makes sick, must have anti-nausea medication in advance  . Levofloxacin In D5w Itching    Possible infusion reaction 03/29/17. Tolerating oral Levaquin 03/30/17  . Septra [Sulfamethoxazole-Trimethoprim] Nausea Only and Other (See Comments)    Per [facility] paperwork    Objective:    Vital Signs:   Temp:  [98 F (36.7 C)-98.7 F (37.1 C)] 98.4 F (36.9 C) (05/04 0743) Pulse Rate:  [62-80] 73 (05/04 0845) Resp:  [16-22] 20 (05/04 0845) BP: (120-133)/(50-65) 120/55 (05/04 0743) SpO2:  [90 %-96 %] 92 % (05/04 0941) FiO2 (%):  [36 %] 36 % (05/04 0845) Weight:  [130 kg] 130 kg (05/04 0442) Last BM Date: 04/30/19  Weight change: Filed Weights   04/30/19 2147 05/01/19 0616 05/02/19 0442  Weight: (!) 172.4 kg 131 kg 130 kg    Intake/Output:   Intake/Output Summary (Last 24 hours) at 05/02/2019 1057 Last data filed at 05/02/2019 0941 Gross per 24 hour  Intake 978.67 ml  Output 3725 ml  Net -2746.33 ml      Physical Exam    General:   No resp difficulty HEENT: normal Neck: supple. JVP 12+ cm with HJR. Carotids 2+ bilat; no bruits. No lymphadenopathy or thyromegaly appreciated. Cor: PMI nondisplaced. Regular rate & rhythm. No rubs, gallops or murmurs. Lungs: clear Abdomen: soft, nontender, + mildly distended. No hepatosplenomegaly. No bruits or masses. Good bowel sounds. Extremities: no cyanosis, clubbing.  1+ edema to knees with erythema Neuro: alert & orientedx3, cranial nerves grossly intact. moves all 4 extremities w/o difficulty. Affect pleasant   Telemetry   NSR 60-70s.   EKG    NSR 74 bpm with RBBB. Personally reviewed.    Labs   Basic Metabolic Panel: Recent Labs  Lab 05/01/19 0123 05/02/19 0552  NA 140 140  K 4.2 3.6  CL 93* 92*  CO2 33* 37*  GLUCOSE 219* 197*  BUN 68* 63*  CREATININE 1.94* 2.01*  CALCIUM 9.3 9.2  MG  --  2.4    Liver Function Tests: Recent Labs  Lab 04/30/19 2337  AST 16  ALT 16  ALKPHOS 80  BILITOT 0.9  PROT 7.1  ALBUMIN 3.4*   No results for input(s): LIPASE, AMYLASE in the last 168 hours. No results for input(s): AMMONIA in the last 168 hours.  CBC: Recent Labs  Lab 05/01/19 0123 05/02/19 0552  WBC 12.4* 9.2  NEUTROABS 11.0* 7.6  HGB 13.1 12.6*  HCT 41.1 39.1  MCV 91.5 90.9  PLT 195 182    Cardiac Enzymes: Recent Labs  Lab 05/01/19 0123  TROPONINI <0.03    BNP: BNP (last 3 results) Recent  Labs    10/27/18 1013 03/02/19 1157 05/01/19 0123  BNP 38.1 44.4 87.6    ProBNP (last 3 results) No results for input(s): PROBNP in the last 8760 hours.   CBG: Recent Labs  Lab 05/01/19 0646 05/01/19 1100 05/01/19 1625 05/01/19 2054 05/02/19 0649  GLUCAP 235* 322* 183* 244* 199*    Coagulation Studies: No results for input(s): LABPROT, INR in the last 72 hours.   Imaging   Vas [place] Lower Extremity Venous (dvt)  Result Date: 05/01/2019  Lower Venous Study Indications: Cellulitis.  Limitations: Poor ultrasound/tissue interface. Performing Technologist: Oliver Hum RVT  Examination Guidelines: A complete evaluation includes B-mode imaging, spectral Doppler, color Doppler, and power Doppler as needed of all accessible portions of each vessel. Bilateral testing is considered an integral part of a complete examination. Limited examinations for reoccurring indications may be performed as noted.  +---------+---------------+---------+-----------+----------+-------+ RIGHT    CompressibilityPhasicitySpontaneityPropertiesSummary +---------+---------------+---------+-----------+----------+-------+ CFV      Full           Yes      Yes                           +---------+---------------+---------+-----------+----------+-------+ SFJ      Full                                                 +---------+---------------+---------+-----------+----------+-------+ FV Prox  Full                                                 +---------+---------------+---------+-----------+----------+-------+ FV Mid   Full                                                 +---------+---------------+---------+-----------+----------+-------+ FV DistalFull           Yes      Yes                          +---------+---------------+---------+-----------+----------+-------+ PFV      Full                                                 +---------+---------------+---------+-----------+----------+-------+ POP      Full           Yes      Yes                          +---------+---------------+---------+-----------+----------+-------+ PTV      Full                                                 +---------+---------------+---------+-----------+----------+-------+ PERO     Full                                                 +---------+---------------+---------+-----------+----------+-------+   +---------+---------------+---------+-----------+----------+--------------+  LEFT     CompressibilityPhasicitySpontaneityPropertiesSummary        +---------+---------------+---------+-----------+----------+--------------+ CFV      Full           Yes      Yes                                 +---------+---------------+---------+-----------+----------+--------------+ SFJ      Full                                                        +---------+---------------+---------+-----------+----------+--------------+ FV Prox  Full                                                        +---------+---------------+---------+-----------+----------+--------------+ FV Mid   Full                                                         +---------+---------------+---------+-----------+----------+--------------+ FV DistalFull           Yes      Yes                                 +---------+---------------+---------+-----------+----------+--------------+ PFV      Full                                                        +---------+---------------+---------+-----------+----------+--------------+ POP      Full           Yes      Yes                                 +---------+---------------+---------+-----------+----------+--------------+ PTV      Full                                                        +---------+---------------+---------+-----------+----------+--------------+ PERO                                                  Not visualized +---------+---------------+---------+-----------+----------+--------------+     Summary: Right: There is no evidence of deep vein thrombosis in the lower extremity. However, portions of this examination were limited- see technologist comments above. No cystic structure found in the popliteal fossa. Left: There is no evidence of deep vein thrombosis in the lower extremity. However, portions of this examination were  limited- see technologist comments above. No cystic structure found in the popliteal fossa.  *See table(s) above for measurements and observations.    Preliminary       Medications:     Current Medications: . aspirin EC  81 mg Oral Daily  . atorvastatin  20 mg Oral q1800  . docusate sodium  200 mg Oral QHS  . doxycycline  100 mg Oral Q12H  . enoxaparin (LOVENOX) injection  65 mg Subcutaneous Q24H  . escitalopram  20 mg Oral Daily  . fesoterodine  8 mg Oral Daily  . gabapentin  400 mg Oral TID  . insulin aspart  0-9 Units Subcutaneous TID WC  . insulin glargine  30 Units Subcutaneous QHS  . ipratropium  2 puff Inhalation QID  . levothyroxine  175 mcg Oral Daily  . metoprolol tartrate  12.5 mg Oral BID  . multivitamin with minerals  1  tablet Oral QHS  . polyethylene glycol  17 g Oral QHS  . pramipexole  0.5 mg Oral q morning - 10a  . pramipexole  1 mg Oral QPM  . sodium chloride flush  3 mL Intravenous Q12H  . tamsulosin  0.4 mg Oral QHS     Infusions: . sodium chloride    . furosemide (LASIX) infusion 10 mg/hr (05/02/19 0941)  . piperacillin-tazobactam (ZOSYN)  IV 12.5 mL/hr at 05/02/19 0941       Patient Profile   Jeffry P [name] a 73 y.o.malewith a history of diastolic heart failure, HTN, hypothyroidism, OSA, DMII, 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  Admitted with concern for sepsis due to UTI/BLE cellulitis and volume overload.   Assessment/Plan   1.Acute on Chronic Diastolic Heart Failure with R>>L symptoms - ECHO 06/29/2018 EF 55-60% Grade IDD  - Suspect due to R-sided HF from OSA, OHS and COPD - PYP scan 10/2018 with Grade 1, H/CLL 1.45. Reviewed by Dr. Mauricia Area determined to be negative. Myeloma panel with no M-spike, normal IFE. - Echo 5/4 EF 60-65%, normal RV - Volumeoverloaded on exam. - Continue lasix 10 mg/hr. Consider metolazone.  -Continue spiro 25 daily - Intolerant to Jardiance with UTI and rash. - Add UNNA boots.   2. ID -> UTI - Blood cx NGTD. UA+ mod leukocytes. Urine cx with multiple species, suggested recollection - Continue Zosyn per primary team. He is also on PO doxy for ?cellulitis.  - Afebrile. WBC now normal 9.2  3.Chronic Hypoxic Respiratory Failure/COPD - On 3 L O2 qHS at home. Currently on 4 L. Wears BiPAP qHS.   4. CKD Stage III - Creatinine baseline~1.90 -Creatinine stable 2.01 today. Monitor closely with diuresis.   5. OSA -ContinueBiPAP qHS - Follows with Dr. Halford Chessman.  6. CAD - Non-obstructive. LHC 08/2015: LM 40, LAD distal 40, LCx ostial 50, RCA proximal 40 -No s/s ischemia. - On ASA, lopressor 12.5 mgBID. Consider decreasing with volume overload.  - Continue statin.  7. DM - Uncontrolled. A1C is  10.0 - Intolerant to [place] with UTI and rash.  8. +D-dimer - BLE dopplers negative for DVT  9. Deconditioning - Consult PT/OT when appropriate.   Medication concerns reviewed with patient and pharmacy team. Barriers identified: none at this time.   Length of Stay: [employer], NP  05/02/2019, 10:57 AM  Advanced Heart Failure Team Pager [phone] (M-F; 7a - 4p)  Please contact [facility] for night-coverage after hours (4p -7a ) and weekends on [email or web address]  Patient seen with NP, agree with  the above note.   Patient presents with gradual onset exertional dyspnea, weight gain, and swelling. Has been getting a lot of take-out food at home, suspect high sodium.  He has been taking torsemide 100 mg bid with metolazone 2.5 on Fridays. He was admitted and started on Lasix gtt, weight down 2 lbs today with good UOP. I/Os net negative 3 L over the last 24 hrs.  Creatinine stable at 2 today (baseline 1.9).  He is starting to feel better.    UA showed possible UTI.   Echo this admission with EF 60-65%, normal RV, dilated IVC.   On exam, JVP 12+ with HJR.  Regular S1S2, no murmur.  Distant BS bilaterally with basilar crackles.  1+ edema to knees bilaterally. Mild abdominal distention.   Assessment/Plan: 1. Acute on chronic diastolic CHF: He has had a negative PYP scan in the past.  Echo this admission with EF 60-65%, normal RV, dilated IVC.  On exam, he is volume overloaded still but diuresing well. Creatinine stable with diuresis, at 2 today (baseline 1.9).   - Continue Lasix gtt at 10 mg/hr, he had a good response to this yesterday and appears to have brisk UOP still.  - Replace K.  - Unna boots 2. CKD: Stage 3.  Baseline creatinine 1.9.  Creatinine 2 today, stable.  3. ID: COVID-19 negative, PCT < 0.1.  Possible UTI (has history of UTIs).  Also concern for cellulitis with lower leg erythema though I think this is more likely due to venous stasis.   - Continue Zosyn and  doxycycline per primary service.  4. COPD: On 2L home oxygen at baseline.  5. OSA: Continue qhs Bipap.  6. CAD: Cath in 9/16 with nonobstructive disease.  No chest pain.   - Continue ASA, metoprolol, statin.  7. DM: Poor control.  8. Elevated D dimer: Negative lower extremity venous dopplers.   Loralie Champagne 05/02/2019 11:38 AM

## 2019-05-02 NOTE — Progress Notes (Signed)
Initial Nutrition Assessment  RD working remotely.  DOCUMENTATION CODES:   Obesity unspecified  INTERVENTION:   -30 ml Prostat BID, each supplement provides 290 kcals and 15 grams protein -MVI with minerals daily -Pt educated on Heart Healthy, Carb Modified diet. Provided "Heart Heathy, Consistent Carbohydrate Nutrition Therapy" handout from [facility] Nutrition Care Manual in AVS/discharge instructions for pt to refer to at home. Pt is aware that this is how he will receive written diet instructions from this RD.   NUTRITION DIAGNOSIS:   Increased nutrient needs related to wound healing as evidenced by estimated needs.  GOAL:   Patient will meet greater than or equal to 90% of their needs  MONITOR:   PO intake, Supplement acceptance, Labs, Weight trends, Skin, I & O's  REASON FOR ASSESSMENT:   Consult Diet education  ASSESSMENT:   Travis [name] is a 73 y.o. male with medical history significant of hypertension, hyperlipidemia, diabetes mellitus, COPD on 2 L nasal cannula oxygen at home, hypothyroidism, depression, CHF, right bundle blockage, RLS, OSA on CPAP, dementia, CKD stage III, who presents with bilateral leg swelling and pain, shortness of breath.  Pt admitted with CHF.  Reviewed I/O's: -3 L x 24 hours and -4 L since admission  UOP: 3.7 L x 24 hours  RD consulted for concerns of diet noncompliance. Per MD notes, pt is unable to weigh himself due to unsteadiness.  Spoke with pt on phone, who sounded fatigued at time of visit. He reports good appetite, consuming most of his food during admission. Noted meal completion documented as 50-100%. Pt shares that he did not eat a lot of breakfast this morning, as he was taken out of his room for a test and when he returned his food was cold.   Pt denies any weight loss. He endorses wt gain related to fluid retention ("I think I eat too much").   Pt shares that he consumes 3-4 meals per day PTA (Breastfast at 0530: oatmeal;  Lunch at 1200: hamburger and french fries; Snack between 12-6: fresh fruit or pack of peanut butter crackers; Dinner at 1800: cheeseburger or salad). Typical beverages are unsweetened teas, diet cola, and milk. Reviewed patient's dietary recall. Provided examples on ways to decrease sodium intake in diet. Discouraged intake of processed foods and use of salt shaker. Encouraged fresh fruits and vegetables as well as whole grain sources of carbohydrates to maximize fiber intake. RD discussed why it is important for patient to adhere to diet recommendations, and emphasized the role of fluids, foods to avoid, and importance of weighing self daily. Discussed different food groups and their effects on blood sugar, emphasizing carbohydrate-containing foods. Provided list of carbohydrates and recommended serving sizes of common foods. Discussed importance of controlled and consistent carbohydrate intake throughout the day. Provided examples of ways to balance meals/snacks and encouraged intake of high-fiber, whole grain complex carbohydrates. Teach back method used. Expect fair to poor compliance. Focus on discussion was how to decrease sodium in diet. Pt shares he eats fast food 3-4 times per week. Discussed ways he could choose healthier options at restaurants. He denies seasoning his food at the table.   Pt also with documented stage II pressure ulcer on sacrum. Pt with increased nutritional needs and would benefit from MVI and supplement.   Lab Results  Component Value Date   HGBA1C 10.0 (H) 05/02/2019   PTA DM medications are 18 units insulin aspart TID with meals, 60 units insulin glargine q HS, and 60 units insulin  degludec daly. Pt reports improvement in CBGS since last endocrinology appointment due to medication adjustments. CBGS run in 100's in the morning and evening and 200's in the afternoon per his reports. Per DM coordinator note, pt was prescribed a freestyle libre CGM at last endocrinology visit, but  unable to obtain due to lack of availability at his home pharmacy/   Labs reviewed: K and Mg WDL. CBGS: 183-244 (inpatient orders for glycemic control are 0-9 units insulin aspart TID with meals and 30 units insulin glargine q HS).   NUTRITION - FOCUSED PHYSICAL EXAM:    Most Recent Value  Orbital Region  Unable to assess  Upper Arm Region  Unable to assess  Thoracic and Lumbar Region  Unable to assess  Buccal Region  Unable to assess  Temple Region  Unable to assess  Clavicle Bone Region  Unable to assess  Clavicle and Acromion Bone Region  Unable to assess  Scapular Bone Region  Unable to assess  Dorsal Hand  Unable to assess  Patellar Region  Unable to assess  Anterior Thigh Region  Unable to assess  Posterior Calf Region  Unable to assess  Edema (RD Assessment)  Unable to assess  Hair  Unable to assess  Eyes  Unable to assess  Mouth  Unable to assess  Skin  Unable to assess  Nails  Unable to assess       Diet Order:   Diet Order            Diet 2 gram sodium Room service appropriate? Yes; Fluid consistency: Thin  Diet effective now              EDUCATION NEEDS:   Education needs have been addressed  Skin:  Skin Assessment: Skin Integrity Issues: Skin Integrity Issues:: Stage II Stage II: sacrum  Last BM:  04/30/19  Height:   Ht Readings from Last 1 Encounters:  05/01/19 6\' 1"  (1.854 m)    Weight:   Wt Readings from Last 1 Encounters:  05/02/19 130 kg    Ideal Body Weight:  83.6 kg  BMI:  Body mass index is 37.83 kg/m.  Estimated Nutritional Needs:   Kcal:  2000-2200  Protein:  105-120 grams  Fluid:  2.0-2.2 L    Allen Basista A. Jimmye Norman, RD, LDN, [employer] Registered Dietitian II Certified Diabetes Care and Education Specialist Pager: [phone] After hours Pager: [phone]

## 2019-05-02 NOTE — Progress Notes (Signed)
PROGRESS NOTE  Travis [name] KCL:[id number] DOB: 1946/01/15 DOA: 04/30/2019 PCP: [name], MD  HPI/Recap of past 24 hours:  Denies chest pain, feeling better,  3.7liter urine output last 24hrs, remain significantly volume overloaded, but able to talk without significant dyspnea He is sitting up in chair  Wife states patient is noncompliance with diet, he does not check his blood sugar often either   No fever     Assessment/Plan: Principal Problem:   Acute on chronic diastolic CHF (congestive heart failure) ([place]) Active Problems:   Sepsis ([place])   OSA on CPAP   Hypothyroid   Stage 3 chronic kidney disease ([place])   Benign essential HTN   Type 2 diabetes mellitus with stage 3 chronic kidney disease, with long-term current use of insulin (H[facility])   Insulin dependent diabetes mellitus ([facility])   Anasarca   Cellulitis of lower extremity   COPD (chronic obstructive pulmonary disease) ([facility])   Depression   RLS (restless legs syndrome)   Acute on chronic diastolic CHF (congestive heart failure) ([place]):  -pt has SOB and anasarca -Chest x-ray showed pulmonary vascular congestion, consistent with CHF exacerbation.   -2D echo on 06/29/2018 showed EF of 55 to 60% with grade 1 diastolic dysfunction. Repeat echo no significant interval changes -venous doppler negative for DVT,  -cardiology consulted, input appreciated, per cardiology "Suspect due to R-sided HF from OSA, OHS and COPD. 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 Reviewed by Dr. Mauricia Area determined to be negative. Myeloma panel with no M-spike, normal IFE." -he appear significantly volume overloaded on presentation ,he is started lasix drip on 5/3, he is responding well, continue lasix drip, follow cardiology recommendation  Sepsis? No fever -cellulitis? Legs Does look inflammed, but does not look grossly infected - blood culture negative, mrsa screen negative -SARS CoV2 negative -h/o pseudomona uti, urine culture  with multiple species. -d/c vanc, add doxycyclin, continue zosyn for now  I think heart failure exacerbation is the main issue here, I am not sure patient is having sepsis, but will await urine culture and monitor fever curve  Insulin dependent DM2, uncontrolled, noncompliant with diet -With hyperglycemia -a1c 10 -adjust insulin  CKDIII Renal dosing meds  COPD (chronic obstructive pulmonary disease) ([place]): on 2L nasal cannula oxygen at home. Currently no wheezing  Depression: -Lexapro  RLS (restless legs syndrome): -Pramipexole  Morbid obesity/OSA Body mass index is 37.83 kg/m.   Sacral stage II pressure ulcer, presented on admission Skin care, pressure offloading measures  FTT:  reports falls at home, reports started use wheelchair/walker at home  PT recommended SNF , patient and wife declined SNF placement, will need to maximize home health  Code Status: full  Family Communication: patient , wife over the phone daily  Disposition Plan: home with home health in 1-2days with cardiology clearance Patient and family declined snf placement   Consultants:  cardiology  Procedures:  none  Antibiotics:  vanc on admission   Zosyn from admission   Objective: BP (!) 117/57 (BP Location: Right Arm)   Pulse (!) 57   Temp 98.1 F (36.7 C) (Oral)   Resp 18   Ht 6\' 1"  (1.854 m)   Wt 130 kg   SpO2 97%   BMI 37.83 kg/m   Intake/Output Summary (Last 24 hours) at 05/02/2019 1302 Last data filed at 05/02/2019 1202 Gross per 24 hour  Intake 1302.67 ml  Output 3325 ml  Net -2022.33 ml   Filed Weights   04/30/19 2147 05/01/19 4944  05/02/19 0442  Weight: (!) 172.4 kg 131 kg 130 kg    Exam: Patient is examined daily including today on 05/02/2019, exams remain the same as of yesterday except that has changed    General:  Look better, no visible dyspnea at rest  Cardiovascular: RRR  Respiratory: diminished at basis, no wheezing, no rales, no rhonchi  Abdomen:  Soft/ND/NT, positive BS  Musculoskeletal: 3+bilateral lower extremity Edema, has unna boots on   Neuro: alert, oriented   Data Reviewed: Basic Metabolic Panel: Recent Labs  Lab 05/01/19 0123 05/02/19 0552  NA 140 140  K 4.2 3.6  CL 93* 92*  CO2 33* 37*  GLUCOSE 219* 197*  BUN 68* 63*  CREATININE 1.94* 2.01*  CALCIUM 9.3 9.2  MG  --  2.4   Liver Function Tests: Recent Labs  Lab 04/30/19 2337  AST 16  ALT 16  ALKPHOS 80  BILITOT 0.9  PROT 7.1  ALBUMIN 3.4*   No results for input(s): LIPASE, AMYLASE in the last 168 hours. No results for input(s): AMMONIA in the last 168 hours. CBC: Recent Labs  Lab 05/01/19 0123 05/02/19 0552  WBC 12.4* 9.2  NEUTROABS 11.0* 7.6  HGB 13.1 12.6*  HCT 41.1 39.1  MCV 91.5 90.9  PLT 195 182   Cardiac Enzymes:   Recent Labs  Lab 05/01/19 0123  TROPONINI <0.03   BNP (last 3 results) Recent Labs    10/27/18 1013 03/02/19 1157 05/01/19 0123  BNP 38.1 44.4 87.6    ProBNP (last 3 results) No results for input(s): PROBNP in the last 8760 hours.  CBG: Recent Labs  Lab 05/01/19 1100 05/01/19 1625 05/01/19 2054 05/02/19 0649 05/02/19 1142  GLUCAP 322* 183* 244* 199* 272*    Recent Results (from the past 240 hour(s))  Urine Culture     Status: Abnormal   Collection Time: 05/01/19  2:58 AM  Result Value Ref Range Status   Specimen Description URINE, RANDOM  Final   Special Requests   Final    NONE Performed at [facility], [place] [address], [place], [place] [address]    Culture MULTIPLE SPECIES PRESENT, SUGGEST RECOLLECTION (A)  Final   Report Status 05/02/2019 FINAL  Final  SARS Coronavirus 2 [facility] order, Performed in [place] hospital lab)     Status: None   Collection Time: 05/01/19  3:24 AM  Result Value Ref Range Status   SARS Coronavirus 2 NEGATIVE NEGATIVE Final    Comment: (NOTE) If result is NEGATIVE SARS-CoV-2 target nucleic acids are NOT DETECTED. The SARS-CoV-2 RNA is generally  detectable in upper and lower  respiratory specimens during the acute phase of infection. The lowest  concentration of SARS-CoV-2 viral copies this assay can detect is 250  copies / mL. A negative result does not preclude SARS-CoV-2 infection  and should not be used as the sole basis for treatment or other  patient management decisions.  A negative result may occur with  improper specimen collection / handling, submission of specimen other  than nasopharyngeal swab, presence of viral mutation(s) within the  areas targeted by this assay, and inadequate number of viral copies  (<250 copies / mL). A negative result must be combined with clinical  observations, patient history, and epidemiological information. If result is POSITIVE SARS-CoV-2 target nucleic acids are DETECTED. The SARS-CoV-2 RNA is generally detectable in upper and lower  respiratory specimens dur ing the acute phase of infection.  Positive  results are indicative of active infection with  SARS-CoV-2.  Clinical  correlation with patient history and other diagnostic information is  necessary to determine patient infection status.  Positive results do  not rule out bacterial infection or co-infection with other viruses. If result is PRESUMPTIVE POSTIVE SARS-CoV-2 nucleic acids MAY BE PRESENT.   A presumptive positive result was obtained on the submitted specimen  and confirmed on repeat testing.  While 2019 novel coronavirus  (SARS-CoV-2) nucleic acids may be present in the submitted sample  additional confirmatory testing may be necessary for epidemiological  and / or clinical management purposes  to differentiate between  SARS-CoV-2 and other Sarbecovirus currently known to infect humans.  If clinically indicated additional testing with an alternate test  methodology [phone]) is advised. The SARS-CoV-2 RNA is generally  detectable in upper and lower respiratory sp ecimens during the acute  phase of infection. The  expected result is Negative. Fact Sheet for Patients:  StrictlyIdeas.no Fact Sheet for Healthcare Providers: BankingDealers.co.za This test is not yet approved or cleared by the [place] FDA and has been authorized for detection and/or diagnosis of SARS-CoV-2 by FDA under an Emergency Use Authorization (EUA).  This EUA will remain in effect (meaning this test can be used) for the duration of the COVID-19 declaration under Section 564(b)(1) of the Act, 21 U.S.C. section 360bbb-3(b)(1), unless the authorization is terminated or revoked sooner. Performed at [facility], [place] [address], [place], [place] [address]   Culture, blood (Routine X 2) w Reflex to ID Panel     Status: None (Preliminary result)   Collection Time: 05/01/19  3:52 AM  Result Value Ref Range Status   Specimen Description BLOOD RIGHT ARM  Final   Special Requests   Final    BOTTLES DRAWN AEROBIC AND ANAEROBIC Blood Culture results may not be optimal due to an excessive volume of blood received in culture bottles   Culture   Final    NO GROWTH 1 DAY Performed at [facility], [place] [address], [place], [place] [address]    Report Status PENDING  Incomplete  Culture, blood (Routine X 2) w Reflex to ID Panel     Status: None (Preliminary result)   Collection Time: 05/01/19  3:52 AM  Result Value Ref Range Status   Specimen Description BLOOD LEFT ARM  Final   Special Requests   Final    BOTTLES DRAWN AEROBIC ONLY Blood Culture adequate volume   Culture   Final    NO GROWTH 1 DAY Performed at [facility], [place] [address], [place], [place] [address]    Report Status PENDING  Incomplete  MRSA PCR Screening     Status: None   Collection Time: 05/01/19  3:23 PM  Result Value Ref Range Status   MRSA by PCR NEGATIVE NEGATIVE Final    Comment:        The GeneXpert MRSA Assay (FDA approved for NASAL specimens only), is one component of a comprehensive  MRSA colonization surveillance program. It is not intended to diagnose MRSA infection nor to guide or monitor treatment for MRSA infections. Performed at [facility], [place] [address], [place], [place] [address]      Studies: No results found.  Scheduled Meds: . aspirin EC  81 mg Oral Daily  . atorvastatin  20 mg Oral q1800  . docusate sodium  200 mg Oral QHS  . doxycycline  100 mg Oral Q12H  . enoxaparin (LOVENOX) injection  65 mg Subcutaneous Q24H  .  escitalopram  20 mg Oral Daily  . fesoterodine  8 mg Oral Daily  . gabapentin  400 mg Oral TID  . insulin aspart  0-9 Units Subcutaneous TID WC  . insulin glargine  30 Units Subcutaneous QHS  . ipratropium  2 puff Inhalation BID  . levothyroxine  175 mcg Oral Daily  . metoprolol tartrate  12.5 mg Oral BID  . multivitamin with minerals  1 tablet Oral QHS  . polyethylene glycol  17 g Oral QHS  . pramipexole  0.5 mg Oral q morning - 10a  . pramipexole  1 mg Oral QPM  . sodium chloride flush  3 mL Intravenous Q12H  . tamsulosin  0.4 mg Oral QHS    Continuous Infusions: . sodium chloride    . furosemide (LASIX) infusion 10 mg/hr (05/02/19 0941)  . piperacillin-tazobactam (ZOSYN)  IV 12.5 mL/hr at 05/02/19 0941     Time spent: 32mins I have personally reviewed and interpreted on  05/02/2019 daily labs, tele strips, imagings as discussed above under date review session and assessment and plans.  I reviewed all nursing notes, pharmacy notes, consultant notes,  vitals, pertinent old records  I have discussed plan of care as described above with RN , patient and wife  on 05/02/2019   Florencia Reasons MD, PhD  [facility] Pager [phone]. If 7PM-7AM, please contact night-coverage at [email or web address], password [facility] 05/02/2019, 1:02 PM  LOS: 1 day

## 2019-05-03 LAB — GLUCOSE, CAPILLARY
Glucose-Capillary: 196 mg/dL — ABNORMAL HIGH (ref 70–99)
Glucose-Capillary: 264 mg/dL — ABNORMAL HIGH (ref 70–99)
Glucose-Capillary: 252 mg/dL — ABNORMAL HIGH (ref 70–99)
Glucose-Capillary: 248 mg/dL — ABNORMAL HIGH (ref 70–99)

## 2019-05-03 LAB — CBC WITH DIFFERENTIAL/PLATELET
Abs Immature Granulocytes: 0.03 10*3/uL (ref 0.00–0.07)
Basophils Absolute: 0 10*3/uL (ref 0.0–0.1)
Basophils Relative: 0 %
Eosinophils Absolute: 0.2 10*3/uL (ref 0.0–0.5)
Eosinophils Relative: 2 %
HCT: 38.3 % — ABNORMAL LOW (ref 39.0–52.0)
Hemoglobin: 12.3 g/dL — ABNORMAL LOW (ref 13.0–17.0)
Immature Granulocytes: 0 %
Lymphocytes Relative: 10 %
Lymphs Abs: 0.9 10*3/uL (ref 0.7–4.0)
MCH: 29.4 pg (ref 26.0–34.0)
MCHC: 32.1 g/dL (ref 30.0–36.0)
MCV: 91.4 fL (ref 80.0–100.0)
Monocytes Absolute: 0.6 10*3/uL (ref 0.1–1.0)
Monocytes Relative: 7 %
Neutro Abs: 7.4 10*3/uL (ref 1.7–7.7)
Neutrophils Relative %: 81 %
Platelets: 186 10*3/uL (ref 150–400)
RBC: 4.19 MIL/uL — ABNORMAL LOW (ref 4.22–5.81)
RDW: 14.6 % (ref 11.5–15.5)
WBC: 9.2 10*3/uL (ref 4.0–10.5)
nRBC: 0 % (ref 0.0–0.2)

## 2019-05-03 LAB — BASIC METABOLIC PANEL
Anion gap: 14 (ref 5–15)
BUN: 62 mg/dL — ABNORMAL HIGH (ref 8–23)
CO2: 36 mmol/L — ABNORMAL HIGH (ref 22–32)
Calcium: 9.5 mg/dL (ref 8.9–10.3)
Chloride: 90 mmol/L — ABNORMAL LOW (ref 98–111)
Creatinine, Ser: 1.84 mg/dL — ABNORMAL HIGH (ref 0.61–1.24)
GFR calc Af Amer: 41 mL/min — ABNORMAL LOW (ref >60)
GFR calc non Af Amer: 35 mL/min — ABNORMAL LOW (ref >60)
Glucose, Bld: 180 mg/dL — ABNORMAL HIGH (ref 70–99)
Potassium: 3.6 mmol/L (ref 3.5–5.1)
Sodium: 140 mmol/L (ref 135–145)

## 2019-05-03 MED ORDER — ZOLPIDEM TARTRATE 5 MG PO TABS
5.0000 mg | ORAL_TABLET | Freq: Every day | ORAL | Status: AC
  Administered 2019-05-03 – 2019-05-04 (×2): 5 mg via ORAL
  Filled 2019-05-03 (×2): qty 1

## 2019-05-03 MED ORDER — FUROSEMIDE 10 MG/ML IJ SOLN
40.0000 mg | Freq: Once | INTRAMUSCULAR | Status: AC
  Administered 2019-05-03: 40 mg via INTRAVENOUS
  Filled 2019-05-03: qty 4

## 2019-05-03 MED ORDER — INSULIN ASPART 100 UNIT/ML ~~LOC~~ SOLN
3.0000 [IU] | Freq: Three times a day (TID) | SUBCUTANEOUS | Status: DC
  Administered 2019-05-04 (×2): 3 [IU] via SUBCUTANEOUS

## 2019-05-03 MED ORDER — FUROSEMIDE 10 MG/ML IJ SOLN
15.0000 mg/h | INTRAVENOUS | Status: DC
  Administered 2019-05-04: 10 mg/h via INTRAVENOUS
  Administered 2019-05-05 – 2019-05-06 (×2): 15 mg/h via INTRAVENOUS
  Filled 2019-05-03 (×4): qty 25

## 2019-05-03 MED ORDER — INSULIN GLARGINE 100 UNIT/ML ~~LOC~~ SOLN
35.0000 [IU] | Freq: Every day | SUBCUTANEOUS | Status: DC
  Administered 2019-05-03: 35 [IU] via SUBCUTANEOUS
  Filled 2019-05-03 (×2): qty 0.35

## 2019-05-03 MED ORDER — METOLAZONE 2.5 MG PO TABS
2.5000 mg | ORAL_TABLET | Freq: Two times a day (BID) | ORAL | Status: DC
  Administered 2019-05-03 – 2019-05-05 (×5): 2.5 mg via ORAL
  Filled 2019-05-03 (×5): qty 1

## 2019-05-03 MED ORDER — FUROSEMIDE 10 MG/ML IJ SOLN
10.0000 mg/h | INTRAVENOUS | Status: DC
  Administered 2019-05-03: 10 mg/h via INTRAVENOUS
  Filled 2019-05-03: qty 20

## 2019-05-03 MED ORDER — SPIRONOLACTONE 12.5 MG HALF TABLET
12.5000 mg | ORAL_TABLET | Freq: Every day | ORAL | Status: DC
  Administered 2019-05-03 – 2019-05-06 (×4): 12.5 mg via ORAL
  Filled 2019-05-03 (×4): qty 1

## 2019-05-03 MED ORDER — POTASSIUM CHLORIDE CRYS ER 20 MEQ PO TBCR
40.0000 meq | EXTENDED_RELEASE_TABLET | Freq: Once | ORAL | Status: AC
  Administered 2019-05-03: 40 meq via ORAL
  Filled 2019-05-03: qty 2

## 2019-05-03 NOTE — Progress Notes (Signed)
Rehab Admissions Coordinator Note:  Patient was screened by Cleatrice Burke for appropriateness for an Inpatient Acute Rehab Consult per OT recs.  At this time, we are recommending Inpatient Rehab consult.  Cleatrice Burke RN MSN 05/03/2019, 4:20 PM  I can be reached at [phone].

## 2019-05-03 NOTE — Progress Notes (Signed)
Advanced Heart Failure Rounding Note   Subjective:    Remains on lasix gtt at 10. Decent diuresis. Weight down 4 pounds.  Drinking a lot of fluid. Denies CP, SOB, or PND. + edema and ab bloating.    Objective:   Weight Range:  Vital Signs:   Temp:  [97.7 F (36.5 C)-98.6 F (37 C)] 98.5 F (36.9 C) (05/05 0839) Pulse Rate:  [60-75] 60 (05/05 1224) Resp:  [18-20] 19 (05/05 1224) BP: (109-132)/(57-69) 109/57 (05/05 1224) SpO2:  [92 %-96 %] 96 % (05/05 1224) Weight:  [128.3 kg] 128.3 kg (05/05 0440) Last BM Date: 05/02/19  Weight change: Filed Weights   05/01/19 0616 05/02/19 0442 05/03/19 0440  Weight: 131 kg 130 kg 128.3 kg    Intake/Output:   Intake/Output Summary (Last 24 hours) at 05/03/2019 1417 Last data filed at 05/03/2019 1226 Gross per 24 hour  Intake 1960.83 ml  Output 2775 ml  Net -814.17 ml     Physical Exam: General:  Sitting in chair. No resp difficulty HEENT: normal Neck: supple. JVP to jaw . Carotids 2+ bilat; no bruits. No lymphadenopathy or thryomegaly appreciated. Cor: PMI nondisplaced. Regular rate & rhythm. No rubs, gallops or murmurs. Lungs: clear Abdomen: marked central obesity soft, nontender, ++ distended. No hepatosplenomegaly. No bruits or masses. Good bowel sounds. Extremities: no cyanosis, clubbing, rash, 2-3+ edema to thigh +UNNA boots   Neuro: alert & orientedx3, cranial nerves grossly intact. moves all 4 extremities w/o difficulty. Affect pleasant  Telemetry: NSR 60s Personally reviewed   Labs: Basic Metabolic Panel: Recent Labs  Lab 05/01/19 0123 05/02/19 0552 05/03/19 0343  NA 140 140 140  K 4.2 3.6 3.6  CL 93* 92* 90*  CO2 33* 37* 36*  GLUCOSE 219* 197* 180*  BUN 68* 63* 62*  CREATININE 1.94* 2.01* 1.84*  CALCIUM 9.3 9.2 9.5  MG  --  2.4  --     Liver Function Tests: Recent Labs  Lab 04/30/19 2337  AST 16  ALT 16  ALKPHOS 80  BILITOT 0.9  PROT 7.1  ALBUMIN 3.4*   No results for input(s): LIPASE,  AMYLASE in the last 168 hours. No results for input(s): AMMONIA in the last 168 hours.  CBC: Recent Labs  Lab 05/01/19 0123 05/02/19 0552 05/03/19 0343  WBC 12.4* 9.2 9.2  NEUTROABS 11.0* 7.6 7.4  HGB 13.1 12.6* 12.3*  HCT 41.1 39.1 38.3*  MCV 91.5 90.9 91.4  PLT 195 182 186    Cardiac Enzymes: Recent Labs  Lab 05/01/19 0123  TROPONINI <0.03    BNP: BNP (last 3 results) Recent Labs    10/27/18 1013 03/02/19 1157 05/01/19 0123  BNP 38.1 44.4 87.6    ProBNP (last 3 results) No results for input(s): PROBNP in the last 8760 hours.    Other results:  Imaging:  No results found.   Medications:     Scheduled Medications:  aspirin EC  81 mg Oral Daily   atorvastatin  20 mg Oral q1800   docusate sodium  200 mg Oral QHS   doxycycline  100 mg Oral Q12H   enoxaparin (LOVENOX) injection  65 mg Subcutaneous Q24H   escitalopram  20 mg Oral Daily   feeding supplement (PRO-STAT SUGAR FREE 64)  30 mL Oral BID   fesoterodine  8 mg Oral Daily   gabapentin  400 mg Oral TID   insulin aspart  0-9 Units Subcutaneous TID WC   insulin glargine  30 Units Subcutaneous QHS  ipratropium  2 puff Inhalation BID   levothyroxine  175 mcg Oral Daily   metoprolol tartrate  12.5 mg Oral BID   multivitamin with minerals  1 tablet Oral QHS   polyethylene glycol  17 g Oral QHS   pramipexole  0.5 mg Oral q morning - 10a   pramipexole  1 mg Oral QPM   sodium chloride flush  3 mL Intravenous Q12H   tamsulosin  0.4 mg Oral QHS     Infusions:  sodium chloride 10 mL/hr at 05/03/19 0009   furosemide (LASIX) infusion     piperacillin-tazobactam (ZOSYN)  IV 3.375 g (05/03/19 0659)     PRN Medications:  sodium chloride, acetaminophen, albuterol, dextromethorphan-guaiFENesin, hydrALAZINE, ondansetron (ZOFRAN) IV, sodium chloride flush, traZODone   Assessment:   [name] Davisis a 73 y.o.malewith a history of diastolic heart failure, HTN, hypothyroidism,  OSA, DMII, 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  Admitted with concern for sepsis due to UTI/BLE cellulitis and volume overload.   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 Reviewed by Dr. [place]icia Area determined to be negative. Myeloma panel with no M-spike, normal IFE. - Echo 5/4 EF 60-65%, normal RV - Remains markedly volumeoverloaded on exam. - Continue lasix 10 mg/hr. Will add metolazone 2.5 bid.  - Reinforced need to limit fluids -Continue spiro 25 daily - Intolerant to [place] with UTI and rash. - Add UNNA boots.   2. ID -> UTI - Blood cx NGTD. UA+ mod leukocytes. Urine cx with multiple species, suggested recollection - Continue Zosyn per primary team. He is also on PO doxy for ?cellulitis.  - Afebrile. WBC now normal 9.2  3.Chronic Hypoxic Respiratory Failure/COPD - On 3 L O2 qHS at home. Currently on 4 L. Wears BiPAP qHS.   4. Hypokalemia - supp   5. CKD Stage III - Creatinine stable at 1.84  -Monitor closely with diuresis.   6. OSA -ContinueBiPAP qHS - Follows with Dr. Halford Chessman.  7. CAD - Non-obstructive. LHC 08/2015: LM 40, LAD distal 40, LCx ostial 50, RCA proximal 40 -No s/s ischemia. - On ASA, lopressor 12.5 mgBID. Consider decreasing with volume overload.  - Continue statin.  8. DM - Uncontrolled. A1C is 10.0 - Intolerant to [place] with UTI and rash.  9. +D-dimer - BLE dopplers negative for DVT  10. Deconditioning - Consult PT/OT when appropriate.   Length of Stay: 2   Glori Bickers MD 05/03/2019, 2:17 PM  Advanced Heart Failure Team Pager [phone] (M-F; [place])  Please contact [facility] for night-coverage after hours (4p -7a ) and weekends on [email or web address]

## 2019-05-03 NOTE — Progress Notes (Signed)
Inpatient Diabetes Program Recommendations  AACE/ADA: New Consensus Statement on Inpatient Glycemic Control (2015)  Target Ranges:  Prepandial:   less than 140 mg/dL      Peak postprandial:   less than 180 mg/dL (1-2 hours)      Critically ill patients:  140 - 180 mg/dL   Lab Results  Component Value Date   GLUCAP 196 (H) 05/03/2019   HGBA1C 10.0 (H) 05/02/2019    Review of Glycemic Control Results for Travis [name], Travis [name] (MRN [id number]) as of 05/03/2019 09:28  Ref. Range 05/02/2019 11:42 05/02/2019 16:19 05/02/2019 21:22 05/03/2019 06:10  Glucose-Capillary Latest Ref Range: 70 - 99 mg/dL 272 (H) 293 (H) 231 (H) 196 (H)   Admit with: SOB/ CHF/ Sepsis due to Bilateral LE Cellulitis and UTI  History: DM, COPD, CHF, CKD  Home DM Meds: Lantus 60 units QHS                             Novolog 18 units TID  Current Orders: Lantus 30 units QHS                            Novolog Sensitive Correction Scale/ SSI (0-9 units) TID AC   MD- Please consider the following in-hospital insulin adjustments:  1. Increase Lantus to 35 units QHS (fasting CBG 196 this AM)  2. Start Novolog Meal Coverage: Novolog 6 units TID with meals (1/3 total home dose)  (Please add the following Hold Parameters: Hold if pt eats <50% of meal, Hold if pt NPO)  Thanks, Bronson Curb, MSN, RNC-OB Diabetes Coordinator [phone] (8a-5p)

## 2019-05-03 NOTE — Progress Notes (Signed)
PROGRESS NOTE  Travis [name] NOM:[id number] DOB: Apr 22, 1946 DOA: 04/30/2019 PCP: [name], MD  HPI/Recap of past 24 hours:  Denies chest pain, no fever, feeling better,  2.7liter urine output last 24hrs, remain significantly volume overloaded, but dyspnea at rest resolved   Continue to reports dysuria, reports has been three weeks, urine is clear, no leukocytosis, no fever,    C/o insomina He wants to go home but he understand he needs another few days of diuresis   Assessment/Plan: Principal Problem:   Acute on chronic diastolic CHF (congestive heart failure) ([place]) Active Problems:   Sepsis ([place])   OSA on CPAP   Hypothyroid   Stage 3 chronic kidney disease ([place])   Benign essential HTN   Type 2 diabetes mellitus with stage 3 chronic kidney disease, with long-term current use of insulin (H[facility])   Insulin dependent diabetes mellitus ([place])   Anasarca   Cellulitis of lower extremity   COPD (chronic obstructive pulmonary disease) ([facility])   Depression   RLS (restless legs syndrome)   Acute on chronic diastolic CHF (congestive heart failure) ([place]):  -pt has SOB and anasarca -Chest x-ray showed pulmonary vascular congestion, consistent with CHF exacerbation.   -2D echo on 06/29/2018 showed EF of 55 to 60% with grade 1 diastolic dysfunction. Repeat echo no significant interval changes -venous doppler negative for DVT,  -cardiology consulted, input appreciated, per cardiology "Suspect due to R-sided HF from OSA, OHS and COPD. 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 Reviewed by Dr. Mauricia Area determined to be negative. Myeloma panel with no M-spike, normal IFE." -he is significantly volume overloaded on presentation ,he is started lasix drip on 5/3, he is responding well, continue lasix drip, follow cardiology recommendation  Sepsis? No fever -cellulitis? Legs Does look inflammed, but does not look grossly infected - blood culture negative, mrsa screen negative, urine  culture with multiple species ( he was started on abx prior to coming to the hospital for uti by pcp) -SARS CoV2 negative -h/o pseudomona uti, urine culture with multiple species. -d/c vanc, add doxycyclin, for possible cellulitis -he does reports dysuria, thought urine is clear continue zosyn for now , plan to total of 5 days treatment ( first dose on 5/3)  I think heart failure exacerbation is the main issue here, I am not sure patient is having sepsis, no fever in the hospital ( he reports one time fever at home prior to admission)  monitor fever curve  Insulin dependent DM2, uncontrolled, noncompliant with diet, does not check blood sugar regularly at home per wife -With hyperglycemia -a1c 10 -adjust insulin -appreciate diabetes RN input  CKDIII Renal dosing meds  COPD (chronic obstructive pulmonary disease) ([place]): on 2L nasal cannula oxygen at home. Currently no wheezing  Depression: -Lexapro  RLS (restless legs syndrome): -Pramipexole  Morbid obesity/OSA Body mass index is 37.32 kg/m.   Sacral stage II pressure ulcer, presented on admission Skin care, pressure offloading measures  FTT:  reports falls at home, reports started use wheelchair/walker at home  PT recommended SNF , patient and wife declined SNF placement,  Will have CIR eval ,   Code Status: full  Family Communication: patient , wife over the phone daily  Disposition Plan: home with home health  Vs CIR in 1-2days with cardiology clearance Patient and family declined snf placement, wife agrees to CIR if he gets approved.   Consultants:  Cardiology  CIR  Procedures:  none  Antibiotics:  vanc on admission  Zosyn from admission   Objective: BP (!) 109/57 (BP Location: Right Arm)   Pulse 60   Temp 98.5 F (36.9 C) (Oral)   Resp 19   Ht 6\' 1"  (1.854 m)   Wt 128.3 kg   SpO2 96%   BMI 37.32 kg/m   Intake/Output Summary (Last 24 hours) at 05/03/2019 1623 Last data filed at 05/03/2019  1556 Gross per 24 hour  Intake 2085.71 ml  Output 2775 ml  Net -689.29 ml   Filed Weights   05/01/19 0616 05/02/19 0442 05/03/19 0440  Weight: 131 kg 130 kg 128.3 kg    Exam: Patient is examined daily including today on 05/03/2019, exams remain the same as of yesterday except that has changed    General:  Look better, dyspnea at rest has resolved  Cardiovascular: RRR  Respiratory: diminished at basis, no wheezing, no rales, no rhonchi  Abdomen: Soft/ND/NT, positive BS  Musculoskeletal: 3+bilateral lower extremity Edema, has unna boots on   Neuro: alert, oriented   Data Reviewed: Basic Metabolic Panel: Recent Labs  Lab 05/01/19 0123 05/02/19 0552 05/03/19 0343  NA 140 140 140  K 4.2 3.6 3.6  CL 93* 92* 90*  CO2 33* 37* 36*  GLUCOSE 219* 197* 180*  BUN 68* 63* 62*  CREATININE 1.94* 2.01* 1.84*  CALCIUM 9.3 9.2 9.5  MG  --  2.4  --    Liver Function Tests: Recent Labs  Lab 04/30/19 2337  AST 16  ALT 16  ALKPHOS 80  BILITOT 0.9  PROT 7.1  ALBUMIN 3.4*   No results for input(s): LIPASE, AMYLASE in the last 168 hours. No results for input(s): AMMONIA in the last 168 hours. CBC: Recent Labs  Lab 05/01/19 0123 05/02/19 0552 05/03/19 0343  WBC 12.4* 9.2 9.2  NEUTROABS 11.0* 7.6 7.4  HGB 13.1 12.6* 12.3*  HCT 41.1 39.1 38.3*  MCV 91.5 90.9 91.4  PLT 195 182 186   Cardiac Enzymes:   Recent Labs  Lab 05/01/19 0123  TROPONINI <0.03   BNP (last 3 results) Recent Labs    10/27/18 1013 03/02/19 1157 05/01/19 0123  BNP 38.1 44.4 87.6    ProBNP (last 3 results) No results for input(s): PROBNP in the last 8760 hours.  CBG: Recent Labs  Lab 05/02/19 1142 05/02/19 1619 05/02/19 2122 05/03/19 0610 05/03/19 1220  GLUCAP 272* 293* 231* 196* 264*    Recent Results (from the past 240 hour(s))  Urine Culture     Status: Abnormal   Collection Time: 05/01/19  2:58 AM  Result Value Ref Range Status   Specimen Description URINE, RANDOM  Final    Special Requests   Final    NONE Performed at [facility], [place] [address], [place], [place] [address]    Culture MULTIPLE SPECIES PRESENT, SUGGEST RECOLLECTION (A)  Final   Report Status 05/02/2019 FINAL  Final  SARS Coronavirus 2 [facility] order, Performed in [place] hospital lab)     Status: None   Collection Time: 05/01/19  3:24 AM  Result Value Ref Range Status   SARS Coronavirus 2 NEGATIVE NEGATIVE Final    Comment: (NOTE) If result is NEGATIVE SARS-CoV-2 target nucleic acids are NOT DETECTED. The SARS-CoV-2 RNA is generally detectable in upper and lower  respiratory specimens during the acute phase of infection. The lowest  concentration of SARS-CoV-2 viral copies this assay can detect is 250  copies / mL. A negative result does not preclude SARS-CoV-2 infection  and should not be  used as the sole basis for treatment or other  patient management decisions.  A negative result may occur with  improper specimen collection / handling, submission of specimen other  than nasopharyngeal swab, presence of viral mutation(s) within the  areas targeted by this assay, and inadequate number of viral copies  (<250 copies / mL). A negative result must be combined with clinical  observations, patient history, and epidemiological information. If result is POSITIVE SARS-CoV-2 target nucleic acids are DETECTED. The SARS-CoV-2 RNA is generally detectable in upper and lower  respiratory specimens dur ing the acute phase of infection.  Positive  results are indicative of active infection with SARS-CoV-2.  Clinical  correlation with patient history and other diagnostic information is  necessary to determine patient infection status.  Positive results do  not rule out bacterial infection or co-infection with other viruses. If result is PRESUMPTIVE POSTIVE SARS-CoV-2 nucleic acids MAY BE PRESENT.   A presumptive positive result was obtained on the submitted specimen  and confirmed on  repeat testing.  While 2019 novel coronavirus  (SARS-CoV-2) nucleic acids may be present in the submitted sample  additional confirmatory testing may be necessary for epidemiological  and / or clinical management purposes  to differentiate between  SARS-CoV-2 and other Sarbecovirus currently known to infect humans.  If clinically indicated additional testing with an alternate test  methodology [phone]) is advised. The SARS-CoV-2 RNA is generally  detectable in upper and lower respiratory sp ecimens during the acute  phase of infection. The expected result is Negative. Fact Sheet for Patients:  StrictlyIdeas.no Fact Sheet for Healthcare Providers: BankingDealers.co.za This test is not yet approved or cleared by the [place] FDA and has been authorized for detection and/or diagnosis of SARS-CoV-2 by FDA under an Emergency Use Authorization (EUA).  This EUA will remain in effect (meaning this test can be used) for the duration of the COVID-19 declaration under Section 564(b)(1) of the Act, 21 U.S.C. section 360bbb-3(b)(1), unless the authorization is terminated or revoked sooner. Performed at [facility], [place] [address], [place], [place] [address]   Culture, blood (Routine X 2) w Reflex to ID Panel     Status: None (Preliminary result)   Collection Time: 05/01/19  3:52 AM  Result Value Ref Range Status   Specimen Description BLOOD RIGHT ARM  Final   Special Requests   Final    BOTTLES DRAWN AEROBIC AND ANAEROBIC Blood Culture results may not be optimal due to an excessive volume of blood received in culture bottles   Culture   Final    NO GROWTH 2 DAYS Performed at [facility], [place] [address] [place], [place] [address]    Report Status PENDING  Incomplete  Culture, blood (Routine X 2) w Reflex to ID Panel     Status: None (Preliminary result)   Collection Time: 05/01/19  3:52 AM  Result Value Ref Range Status    Specimen Description BLOOD LEFT ARM  Final   Special Requests   Final    BOTTLES DRAWN AEROBIC ONLY Blood Culture adequate volume   Culture   Final    NO GROWTH 2 DAYS Performed at [facility], [place] [address], [place], [place] [address]    Report Status PENDING  Incomplete  MRSA PCR Screening     Status: None   Collection Time: 05/01/19  3:23 PM  Result Value Ref Range Status   MRSA by PCR NEGATIVE NEGATIVE Final    Comment:  The GeneXpert MRSA Assay (FDA approved for NASAL specimens only), is one component of a comprehensive MRSA colonization surveillance program. It is not intended to diagnose MRSA infection nor to guide or monitor treatment for MRSA infections. Performed at [facility], [place] [address] [place], [place] [address]      Studies: No results found.  Scheduled Meds: . aspirin EC  81 mg Oral Daily  . atorvastatin  20 mg Oral q1800  . docusate sodium  200 mg Oral QHS  . doxycycline  100 mg Oral Q12H  . enoxaparin (LOVENOX) injection  65 mg Subcutaneous Q24H  . escitalopram  20 mg Oral Daily  . feeding supplement (PRO-STAT SUGAR FREE 64)  30 mL Oral BID  . fesoterodine  8 mg Oral Daily  . gabapentin  400 mg Oral TID  . insulin aspart  0-9 Units Subcutaneous TID WC  . insulin glargine  30 Units Subcutaneous QHS  . ipratropium  2 puff Inhalation BID  . levothyroxine  175 mcg Oral Daily  . metolazone  2.5 mg Oral BID  . metoprolol tartrate  12.5 mg Oral BID  . multivitamin with minerals  1 tablet Oral QHS  . polyethylene glycol  17 g Oral QHS  . pramipexole  0.5 mg Oral q morning - 10a  . pramipexole  1 mg Oral QPM  . sodium chloride flush  3 mL Intravenous Q12H  . spironolactone  12.5 mg Oral Daily  . tamsulosin  0.4 mg Oral QHS    Continuous Infusions: . sodium chloride 10 mL/hr at 05/03/19 0009  . furosemide (LASIX) infusion    . piperacillin-tazobactam (ZOSYN)  IV 3.375 g (05/03/19 1536)     Time spent: 28mins I have  personally reviewed and interpreted on  05/03/2019 daily labs, tele strips, imagings as discussed above under date review session and assessment and plans.  I reviewed all nursing notes, pharmacy notes, consultant notes,  vitals, pertinent old records  I have discussed plan of care as described above with RN , patient  And wife on 05/03/2019   Florencia Reasons MD, PhD  [facility] Pager [phone]. If 7PM-7AM, please contact night-coverage at [email or web address], password [facility] 05/03/2019, 4:23 PM  LOS: 2 days

## 2019-05-03 NOTE — Evaluation (Signed)
Occupational Therapy Evaluation Patient Details Name: Travis [name] MRN: [id number] DOB: 1946-10-13 Today's Date: 05/03/2019    History of Present Illness 72 y.o. male admitted with edema, SOB, CHF exacerbation. PMHx: HTN, HLD, DM, COPD on 2L at home, hypothyroidism, depression, CHF, RT BBB, RLS, OSA on CPAP, dementia, CKD stage III   Clinical Impression   Prior to hospitalization Pt was getting min A from family for bathing/dressing, short ambulation for bathroom needs, but using a wc for community mobility (pre-COVID times). Today Pt was max A +2 with use of stedy for sit <>stand transfers, max A for LB ADL and min to set up for UB ADL. Impacted by decreased activity tolerance, decreased balance, decreased strength, decreased awareness of deficits. Pt will require skilled OT in the acute setting as well as afterwards at the CIR level to maximize safety and independence in ADL and functional transfers. At this time, wife is not willing to consider SNF due to [place]. The family/patient is familiar with CIR and expectations from admission approximately one year ago.     Follow Up Recommendations  CIR;Supervision/Assistance - 24 hour    Equipment Recommendations  None recommended by OT(Pt has appropriate DME)    Recommendations for Other Services Rehab consult     Precautions / Restrictions Precautions Precautions: Fall Restrictions Weight Bearing Restrictions: No      Mobility Bed Mobility Overal bed mobility: Needs Assistance Bed Mobility: Sit to Supine       Sit to supine: Mod assist;+2 for physical assistance;+2 for safety/equipment   General bed mobility comments: assist for BLE back into bed, and trunk lowering  Transfers Overall transfer level: Needs assistance   Transfers: Sit to/from Stand Sit to Stand: Max assist;+2 physical assistance;+2 safety/equipment         General transfer comment: use of bed pad to assist with boost; heavy heavy assist even with Pt  pulling up on Stedy, cues throughout    Balance Overall balance assessment: Needs assistance   Sitting balance-Leahy Scale: Fair     Standing balance support: Bilateral upper extremity supported Standing balance-Leahy Scale: Poor Standing balance comment: reliant on bil UE support for standing                           ADL either performed or assessed with clinical judgement   ADL Overall ADL's : Needs assistance/impaired Eating/Feeding: Independent   Grooming: Set up;Sitting   Upper Body Bathing: Minimal assistance;Sitting   Lower Body Bathing: Moderate assistance;Sitting/lateral leans   Upper Body Dressing : Min guard;Sitting   Lower Body Dressing: Maximal assistance;+2 for physical assistance;+2 for safety/equipment;Sit to/from stand   Toilet Transfer: Maximal assistance;+2 for physical assistance;+2 for safety/equipment   Toileting- Clothing Manipulation and Hygiene: Maximal assistance;+2 for physical assistance;+2 for safety/equipment         General ADL Comments: Pt required use of stedy as well as max A +2 for sit<>stand, decreased access to LB      Vision Baseline Vision/History: Wears glasses Patient Visual Report: No change from baseline       Perception     Praxis      Pertinent Vitals/Pain Pain Assessment: No/denies pain     Hand Dominance Right   Extremity/Trunk Assessment Upper Extremity Assessment Upper Extremity Assessment: Generalized weakness   Lower Extremity Assessment Lower Extremity Assessment: Defer to PT evaluation   Cervical / Trunk Assessment Cervical / Trunk Assessment: Kyphotic   Communication Communication Communication: No difficulties  Cognition Arousal/Alertness: Awake/alert Behavior During Therapy: WFL for tasks assessed/performed Overall Cognitive Status: Impaired/Different from baseline Area of Impairment: Safety/judgement;Problem solving                         Safety/Judgement: Decreased  awareness of safety;Decreased awareness of deficits   Problem Solving: Slow processing;Decreased initiation     General Comments  Pt's wife is adament that he will NOT go to SNF    Exercises     Shoulder Instructions      Home Living Family/patient expects to be discharged to:: Private residence Living Arrangements: Spouse/significant other;Children(adult son is there is in the mornings (works 2nd shift)) Available Help at Discharge: Family;Available 24 hours/day;Personal care attendant Type of Home: House Home Access: Ramped entrance     Home Layout: One level     Bathroom Shower/Tub: Tub/shower unit;Walk-in shower;Curtain   Bathroom Toilet: Handicapped height Bathroom Accessibility: Yes How Accessible: Accessible via wheelchair(per chart review they can barely get wc in bathroom) Home Equipment: Walker - 2 wheels;Walker - 4 wheels;Cane - single point;Bedside commode;Wheelchair - manual;Shower seat - built in;Grab bars - tub/shower;Hospital bed;Other (comment);Grab bars - toilet   Additional Comments: 2x/wk aide for 1 hour.      Prior Functioning/Environment Level of Independence: Needs assistance  Gait / Transfers Assistance Needed: pt reports he was walking to the bathroom, W/C for longer distances with ability to transfer with min assist ADL's / Homemaking Assistance Needed: Pt wife assists with ADL's including bathing and dressing            OT Problem List: Decreased activity tolerance;Impaired balance (sitting and/or standing);Decreased knowledge of use of DME or AE;Obesity;Increased edema      OT Treatment/Interventions: Self-care/ADL training;DME and/or AE instruction;Therapeutic activities;Patient/family education;Balance training    OT Goals(Current goals can be found in the care plan section) Acute Rehab OT Goals Patient Stated Goal: return home and be able to walk with my cane OT Goal Formulation: With patient Time For Goal Achievement:  05/17/19 Potential to Achieve Goals: Good ADL Goals Pt Will Perform Grooming: with modified independence;sitting Pt Will Perform Lower Body Bathing: with min guard assist;with caregiver independent in assisting;sit to/from stand Pt Will Perform Lower Body Dressing: with min assist;sit to/from stand;with caregiver independent in assisting Pt Will Transfer to Toilet: with min assist;ambulating Pt Will Perform Toileting - Clothing Manipulation and hygiene: with min guard assist;sit to/from stand Additional ADL Goal #1: Pt will perform bed mobility at min guard level prior to engaging in ADL  OT Frequency: Min 2X/week   Barriers to D/C:    Pt's wife will NOT allow him to go to SNF       Co-evaluation              AM-PAC OT "6 Clicks" Daily Activity     Outcome Measure Help from another person eating meals?: None Help from another person taking care of personal grooming?: None(seated) Help from another person toileting, which includes using toliet, bedpan, or urinal?: A Lot Help from another person bathing (including washing, rinsing, drying)?: A Lot Help from another person to put on and taking off regular upper body clothing?: A Little Help from another person to put on and taking off regular lower body clothing?: A Lot 6 Click Score: 17   End of Session Equipment Utilized During Treatment: Gait belt;Oxygen;Other (comment)(Stedy) Nurse Communication: Mobility status  Activity Tolerance: Patient tolerated treatment well Patient left: in bed;with call bell/phone within reach;with bed alarm  set  OT Visit Diagnosis: Unsteadiness on feet (R26.81);Other abnormalities of gait and mobility (R26.89);Muscle weakness (generalized) (M62.81)                Time: 1165-7903 OT Time Calculation (min): 35 min Charges:  OT General Charges $OT Visit: 1 Visit OT Evaluation $OT Eval Moderate Complexity: 1 Mod OT Treatments $Self Care/Home Management : 8-22 mins  Hulda Humphrey OTR/L Acute  Rehabilitation Services Pager: [phone] Office: [place] Yeraldin Litzenberger 05/03/2019, 3:43 PM

## 2019-05-03 NOTE — Consult Note (Signed)
   [facility] CM Inpatient Consult   05/03/2019  Travis [name] 03/31/1946 [id number]  Patient screened for high risk score [27%] for unplanned readmissions and hospitalizations to check if potential [facility] Management services are needed. Patient with [employer] plan.  Patient is a 73 year old male that has been hospitalized for volume overload with HF exacerbation.  Patient is diabetic and Hemoglobin A1C of 10 noted. Patient with a HX of COPD.   Primary Care Provider is Dr. Rockne Menghini.   This office provides the transition of care follow up also. Pharmacy is: [facility]. Transportation to provider: Family  Continue to follow progress and disposition to assess for post hospital care management needs.     Please place a [facility] Care Management consult as appropriate and for questions contact:   Natividad Brood, RN BSN [facility] Liaison  [phone] business mobile phone Toll free office [phone]  Fax number: [phone] [email or web address]  [email or web address]

## 2019-05-04 LAB — BASIC METABOLIC PANEL
Anion gap: 14 (ref 5–15)
BUN: 61 mg/dL — ABNORMAL HIGH (ref 8–23)
CO2: 39 mmol/L — ABNORMAL HIGH (ref 22–32)
Calcium: 9.4 mg/dL (ref 8.9–10.3)
Chloride: 86 mmol/L — ABNORMAL LOW (ref 98–111)
Creatinine, Ser: 1.85 mg/dL — ABNORMAL HIGH (ref 0.61–1.24)
GFR calc Af Amer: 41 mL/min — ABNORMAL LOW (ref >60)
GFR calc non Af Amer: 35 mL/min — ABNORMAL LOW (ref >60)
Glucose, Bld: 249 mg/dL — ABNORMAL HIGH (ref 70–99)
Potassium: 4 mmol/L (ref 3.5–5.1)
Sodium: 139 mmol/L (ref 135–145)

## 2019-05-04 LAB — GLUCOSE, CAPILLARY
Glucose-Capillary: 230 mg/dL — ABNORMAL HIGH (ref 70–99)
Glucose-Capillary: 315 mg/dL — ABNORMAL HIGH (ref 70–99)
Glucose-Capillary: 239 mg/dL — ABNORMAL HIGH (ref 70–99)

## 2019-05-04 LAB — HEMOGLOBIN A1C
Hgb A1c MFr Bld: 10.1 % — ABNORMAL HIGH (ref 4.8–5.6)
Mean Plasma Glucose: 243.17 mg/dL

## 2019-05-04 MED ORDER — INSULIN ASPART 100 UNIT/ML ~~LOC~~ SOLN
8.0000 [IU] | Freq: Three times a day (TID) | SUBCUTANEOUS | Status: DC
  Administered 2019-05-04 – 2019-05-06 (×6): 8 [IU] via SUBCUTANEOUS

## 2019-05-04 MED ORDER — ENOXAPARIN SODIUM 60 MG/0.6ML ~~LOC~~ SOLN
60.0000 mg | SUBCUTANEOUS | Status: DC
  Administered 2019-05-05 – 2019-05-11 (×7): 60 mg via SUBCUTANEOUS
  Filled 2019-05-04 (×7): qty 0.6

## 2019-05-04 MED ORDER — ALUM & MAG HYDROXIDE-SIMETH 200-200-20 MG/5ML PO SUSP
30.0000 mL | ORAL | Status: DC | PRN
  Administered 2019-05-04: 30 mL via ORAL
  Filled 2019-05-04: qty 30

## 2019-05-04 MED ORDER — INSULIN GLARGINE 100 UNIT/ML ~~LOC~~ SOLN
40.0000 [IU] | Freq: Every day | SUBCUTANEOUS | Status: DC
  Administered 2019-05-04: 40 [IU] via SUBCUTANEOUS
  Filled 2019-05-04 (×2): qty 0.4

## 2019-05-04 MED ORDER — INSULIN ASPART 100 UNIT/ML ~~LOC~~ SOLN
0.0000 [IU] | Freq: Every day | SUBCUTANEOUS | Status: DC
  Administered 2019-05-04 – 2019-05-08 (×2): 2 [IU] via SUBCUTANEOUS

## 2019-05-04 MED ORDER — INSULIN ASPART 100 UNIT/ML ~~LOC~~ SOLN
0.0000 [IU] | Freq: Three times a day (TID) | SUBCUTANEOUS | Status: DC
  Administered 2019-05-04: 11 [IU] via SUBCUTANEOUS
  Administered 2019-05-05: 5 [IU] via SUBCUTANEOUS
  Administered 2019-05-05: 3 [IU] via SUBCUTANEOUS
  Administered 2019-05-05: 5 [IU] via SUBCUTANEOUS
  Administered 2019-05-06: 8 [IU] via SUBCUTANEOUS
  Administered 2019-05-06 (×2): 3 [IU] via SUBCUTANEOUS
  Administered 2019-05-07: 2 [IU] via SUBCUTANEOUS
  Administered 2019-05-07: 8 [IU] via SUBCUTANEOUS
  Administered 2019-05-07: 5 [IU] via SUBCUTANEOUS
  Administered 2019-05-08 (×2): 2 [IU] via SUBCUTANEOUS
  Administered 2019-05-09: 5 [IU] via SUBCUTANEOUS
  Administered 2019-05-09 – 2019-05-10 (×3): 2 [IU] via SUBCUTANEOUS
  Administered 2019-05-10 – 2019-05-11 (×3): 3 [IU] via SUBCUTANEOUS
  Administered 2019-05-11: 5 [IU] via SUBCUTANEOUS

## 2019-05-04 NOTE — Progress Notes (Signed)
Inpatient Diabetes Program Recommendations  AACE/ADA: New Consensus Statement on Inpatient Glycemic Control (2015)  Target Ranges:  Prepandial:   less than 140 mg/dL      Peak postprandial:   less than 180 mg/dL (1-2 hours)      Critically ill patients:  140 - 180 mg/dL   Lab Results  Component Value Date   GLUCAP 315 (H) 05/04/2019   HGBA1C 10.0 (H) 05/02/2019    Review of Glycemic Control Results for Travis [name], Travis [name] (MRN [id number]) as of 05/04/2019 13:26  Ref. Range 05/03/2019 16:49 05/03/2019 21:01 05/04/2019 06:27 05/04/2019 11:29  Glucose-Capillary Latest Ref Range: 70 - 99 mg/dL 252 (H) 248 (H) 230 (H) 315 (H)   Admit with:SOB/ CHF/ Sepsis due to Bilateral LE Cellulitis and UTI  History:DM, COPD, CHF, CKD  Home DM Meds:Lantus 60 units QHS Novolog 18 units TID  Current Orders:Lantus 35 units QHS Novolog Sensitive Correction Scale/ SSI (0-9 units) TID AC   MD- Please consider the following in-hospital insulin adjustments:  1. Increase Lantus to 42 units QHS (fasting CBG 230 this AM)  2. Start Novolog Meal Coverage: Novolog 8 units TID with meals. (Please add the following Hold Parameters: Hold if pt eats <50% of meal, Hold if pt NPO)  3. Increase correction to Novolog 0-15 units TID.  Thanks, Bronson Curb, MSN, RNC-OB Diabetes Coordinator [phone] (8a-5p)

## 2019-05-04 NOTE — Progress Notes (Signed)
[facility]                                                                              Patient Demographics  Travis [name], is a 73 y.o. male, DOB - 03/22/1946, LFY:[id number]  Admit date - 04/30/2019   Admitting Physician Ivor Costa, MD  Outpatient Primary MD for the patient is [name], MD  Outpatient specialists:   LOS - 3  days   Medical records reviewed and are as summarized below:    Chief Complaint  Patient presents with   Shortness of Breath       Brief summary   Patient is a 73 year old male with hypertension, hyperlipidemia, diabetes, COPD on 2 L nasal cannula at home, hypothyroidism, CHF, OSA on CPAP, dementia, CKD stage III presented with bilateral leg edema, pain, anasarca and shortness of breath.  His legs were also erythematous. In ED, patient was found to have BNP 87.6, negative troponins, stable renal function, chest x-ray showed low volume and vascular congestion without obvious infiltration. Rapid COVID test negative  Assessment & Plan    Principal Problem:   Acute on chronic diastolic CHF (congestive heart failure) ([place]) -Presented with anasarca, shortness of breath, bilateral lower extremity edema.  Chest x-ray showed pulmonary vascular congestion -CHF team following, on Lasix drip.  Remains volume overloaded, cardiology planning to increase Lasix to 50 mg/hour, metolazone 2.5 mg twice daily.  Continue spironolactone -Add Unna boots. -Negative balance of 6.6 L, weight 380lbs on admission-> 276 today  Active Problems: Lower extremity cellulitis -Blood cultures negative, MRSA screen negative, urine culture with multiple species, was on antibiotics prior to admission for UTI -Cover test negative  -Placed on doxycycline for cellulitis, Unna boots  Type II IDDM, uncontrolled with CKD - CBG's Uncontrolled, increase Lantus to 40 units at bedtime, add meal coverage 8 units 3 times daily AC, will increase correction sliding scale to  moderate  CKD stage III -Creatinine stable at baseline   COPD  -Stable, no wheezing   Depression - cont lexapro, currently stable, no SI, HI   Restless leg syndrome - cont pramipexole   Morbid obesity  BMI 37, recommended diet and weight control  Sacral stage II pressure ulcer, POA Continue wound care  Failure to thrive, generalized debility -Reported falls at home, uses wheelchair/walker at home PT recommended skilled nursing facility however patient and wife declined -CIR evaluating the patient  Code Status: Full code DVT Prophylaxis:  Family Communication: Discussed in detail with the patient, all imaging results, lab results explained to the patient    Disposition Plan: Currently very weak and 2 person assist, will be difficult to manage at home.  Patient reports that he lives at home with his wife and son.  Time Spent in minutes 25 minutes  Procedures:  None  Consultants:   Cardiology CIR  Antimicrobials:   Anti-infectives (From admission, onward)   Start     Dose/Rate Route Frequency Ordered Stop   05/02/19 0500  vancomycin (VANCOCIN) 1,500 mg in sodium chloride 0.9 % 500 mL IVPB  Status:  Discontinued     1,500 mg 250 mL/hr over 120 Minutes Intravenous Every  24 hours 05/01/19 0358 05/01/19 1500   05/01/19 1515  doxycycline (VIBRA-TABS) tablet 100 mg     100 mg Oral Every 12 hours 05/01/19 1500     05/01/19 1200  piperacillin-tazobactam (ZOSYN) IVPB 3.375 g     3.375 g 12.5 mL/hr over 240 Minutes Intravenous Every 8 hours 05/01/19 0358     05/01/19 0400  vancomycin (VANCOCIN) IVPB 1000 mg/200 mL premix     1,000 mg 200 mL/hr over 60 Minutes Intravenous  Once 05/01/19 0355 05/01/19 0544   05/01/19 0245  vancomycin (VANCOCIN) IVPB 1000 mg/200 mL premix  Status:  Discontinued     1,000 mg 200 mL/hr over 60 Minutes Intravenous  Once 05/01/19 0235 05/01/19 0437   05/01/19 0245  piperacillin-tazobactam (ZOSYN) IVPB 3.375 g     3.375 g 100 mL/hr over  30 Minutes Intravenous  Once 05/01/19 0235 05/01/19 0413          Medications  Scheduled Meds:  aspirin EC  81 mg Oral Daily   atorvastatin  20 mg Oral q1800   docusate sodium  200 mg Oral QHS   doxycycline  100 mg Oral Q12H   [START ON 05/05/2019] enoxaparin (LOVENOX) injection  60 mg Subcutaneous Q24H   escitalopram  20 mg Oral Daily   feeding supplement (PRO-STAT SUGAR FREE 64)  30 mL Oral BID   fesoterodine  8 mg Oral Daily   gabapentin  400 mg Oral TID   insulin aspart  0-9 Units Subcutaneous TID WC   insulin aspart  3 Units Subcutaneous TID WC   insulin glargine  35 Units Subcutaneous QHS   ipratropium  2 puff Inhalation BID   levothyroxine  175 mcg Oral Daily   metolazone  2.5 mg Oral BID   metoprolol tartrate  12.5 mg Oral BID   multivitamin with minerals  1 tablet Oral QHS   polyethylene glycol  17 g Oral QHS   pramipexole  0.5 mg Oral q morning - 10a   pramipexole  1 mg Oral QPM   sodium chloride flush  3 mL Intravenous Q12H   spironolactone  12.5 mg Oral Daily   tamsulosin  0.4 mg Oral QHS   zolpidem  5 mg Oral QHS   Continuous Infusions:  sodium chloride 10 mL/hr at 05/03/19 0009   furosemide (LASIX) infusion 10 mg/hr (05/04/19 1138)   piperacillin-tazobactam (ZOSYN)  IV 3.375 g (05/04/19 0641)   PRN Meds:.sodium chloride, acetaminophen, albuterol, alum & mag hydroxide-simeth, dextromethorphan-guaiFENesin, hydrALAZINE, ondansetron (ZOFRAN) IV, sodium chloride flush, traZODone      Subjective:   Travis [name] was seen and examined today.  Very weak, deconditioned, two-person assist.  No fevers or chills, no chest pain.  Patient denies dizziness, chest pain, abdominal pain, N/V/D/C, new weakness, numbess, tingling. No acute events overnight.    Objective:   Vitals:   05/04/19 0635 05/04/19 0834 05/04/19 0841 05/04/19 1132  BP:  (!) 121/94  (!) 121/58  Pulse:  72  62  Resp:    (!) 21  Temp:    98.2 F (36.8 C)  TempSrc:     Oral  SpO2:   91% 96%  Weight: 125.2 kg     Height:        Intake/Output Summary (Last 24 hours) at 05/04/2019 1330 Last data filed at 05/04/2019 0832 Gross per 24 hour  Intake 1346.56 ml  Output 3325 ml  Net -1978.44 ml     Wt Readings from Last 3 Encounters:  05/04/19 125.2 kg  04/15/19 129.7 kg  03/04/19 126.1 kg     Exam  General: Alert and oriented x 3, NAD, ill-appearing  Eyes  HEENT:    Cardiovascular: S1 S2 auscultated,  Regular rate and rhythm.  Respiratory: No wheezing, diminished at bases  Gastrointestinal: Soft, nontender, nondistended, + bowel sounds  Ext: 2-3+ pedal edema bilaterally  Neuro: No new deficits  Musculoskeletal: No digital cyanosis, clubbing  Skin: No rashes  Psych: Normal affect and demeanor, alert and oriented x3    Data Reviewed:  I have personally reviewed following labs and imaging studies  Micro Results Recent Results (from the past 240 hour(s))  Urine Culture     Status: Abnormal   Collection Time: 05/01/19  2:58 AM  Result Value Ref Range Status   Specimen Description URINE, RANDOM  Final   Special Requests   Final    NONE Performed at [facility], [address], [place], [place] [address]    Culture MULTIPLE SPECIES PRESENT, SUGGEST RECOLLECTION (A)  Final   Report Status 05/02/2019 FINAL  Final  SARS Coronavirus 2 [facility] order, Performed in [place] hospital lab)     Status: None   Collection Time: 05/01/19  3:24 AM  Result Value Ref Range Status   SARS Coronavirus 2 NEGATIVE NEGATIVE Final    Comment: (NOTE) If result is NEGATIVE SARS-CoV-2 target nucleic acids are NOT DETECTED. The SARS-CoV-2 RNA is generally detectable in upper and lower  respiratory specimens during the acute phase of infection. The lowest  concentration of SARS-CoV-2 viral copies this assay can detect is 250  copies / mL. A negative result does not preclude SARS-CoV-2 infection  and should not be used as the sole basis for  treatment or other  patient management decisions.  A negative result may occur with  improper specimen collection / handling, submission of specimen other  than nasopharyngeal swab, presence of viral mutation(s) within the  areas targeted by this assay, and inadequate number of viral copies  (<250 copies / mL). A negative result must be combined with clinical  observations, patient history, and epidemiological information. If result is POSITIVE SARS-CoV-2 target nucleic acids are DETECTED. The SARS-CoV-2 RNA is generally detectable in upper and lower  respiratory specimens dur ing the acute phase of infection.  Positive  results are indicative of active infection with SARS-CoV-2.  Clinical  correlation with patient history and other diagnostic information is  necessary to determine patient infection status.  Positive results do  not rule out bacterial infection or co-infection with other viruses. If result is PRESUMPTIVE POSTIVE SARS-CoV-2 nucleic acids MAY BE PRESENT.   A presumptive positive result was obtained on the submitted specimen  and confirmed on repeat testing.  While 2019 novel coronavirus  (SARS-CoV-2) nucleic acids may be present in the submitted sample  additional confirmatory testing may be necessary for epidemiological  and / or clinical management purposes  to differentiate between  SARS-CoV-2 and other Sarbecovirus currently known to infect humans.  If clinically indicated additional testing with an alternate test  methodology [phone]) is advised. The SARS-CoV-2 RNA is generally  detectable in upper and lower respiratory sp ecimens during the acute  phase of infection. The expected result is Negative. Fact Sheet for Patients:  StrictlyIdeas.no Fact Sheet for Healthcare Providers: BankingDealers.co.za This test is not yet approved or cleared by the [place] FDA and has been authorized for detection and/or  diagnosis of SARS-CoV-2 by FDA under an Emergency Use Authorization (EUA).  This EUA  will remain in effect (meaning this test can be used) for the duration of the COVID-19 declaration under Section 564(b)(1) of the Act, 21 U.S.C. section 360bbb-3(b)(1), unless the authorization is terminated or revoked sooner. Performed at [facility], [place] [address], [place], [place] [address]   Culture, blood (Routine X 2) w Reflex to ID Panel     Status: None (Preliminary result)   Collection Time: 05/01/19  3:52 AM  Result Value Ref Range Status   Specimen Description BLOOD RIGHT ARM  Final   Special Requests   Final    BOTTLES DRAWN AEROBIC AND ANAEROBIC Blood Culture results may not be optimal due to an excessive volume of blood received in culture bottles   Culture   Final    NO GROWTH 3 DAYS Performed at [facility], [place] [address], [place], [place] [address]    Report Status PENDING  Incomplete  Culture, blood (Routine X 2) w Reflex to ID Panel     Status: None (Preliminary result)   Collection Time: 05/01/19  3:52 AM  Result Value Ref Range Status   Specimen Description BLOOD LEFT ARM  Final   Special Requests   Final    BOTTLES DRAWN AEROBIC ONLY Blood Culture adequate volume   Culture   Final    NO GROWTH 3 DAYS Performed at [facility], [address], [place], [place] [address]    Report Status PENDING  Incomplete  MRSA PCR Screening     Status: None   Collection Time: 05/01/19  3:23 PM  Result Value Ref Range Status   MRSA by PCR NEGATIVE NEGATIVE Final    Comment:        The GeneXpert MRSA Assay (FDA approved for NASAL specimens only), is one component of a comprehensive MRSA colonization surveillance program. It is not intended to diagnose MRSA infection nor to guide or monitor treatment for MRSA infections. Performed at [facility], [place] [address], [place], [place] [address]     Radiology Reports Dg Chest Portable 1 View  Result Date:  05/01/2019 CLINICAL DATA:  Shortness of breath EXAM: PORTABLE CHEST 1 VIEW COMPARISON:  11/29/2018 FINDINGS: Very low lung volumes. Bibasilar atelectasis. Vascular congestion. No visible effusions or acute bony abnormality. IMPRESSION: Very low lung volumes with bibasilar atelectasis and vascular congestion. Electronically Signed   By: Rolm Baptise M.D.   On: 05/01/2019 00:50   Vas [place] Lower Extremity Venous (dvt)  Result Date: 05/02/2019  Lower Venous Study Indications: Cellulitis.  Limitations: Poor ultrasound/tissue interface. Performing Technologist: Oliver Hum RVT  Examination Guidelines: A complete evaluation includes B-mode imaging, spectral Doppler, color Doppler, and power Doppler as needed of all accessible portions of each vessel. Bilateral testing is considered an integral part of a complete examination. Limited examinations for reoccurring indications may be performed as noted.  +---------+---------------+---------+-----------+----------+-------+  RIGHT     Compressibility Phasicity Spontaneity Properties Summary  +---------+---------------+---------+-----------+----------+-------+  CFV       Full            Yes       Yes                             +---------+---------------+---------+-----------+----------+-------+  SFJ       Full                                                      +---------+---------------+---------+-----------+----------+-------+  FV Prox   Full                                                      +---------+---------------+---------+-----------+----------+-------+  FV Mid    Full                                                      +---------+---------------+---------+-----------+----------+-------+  FV Distal Full            Yes       Yes                             +---------+---------------+---------+-----------+----------+-------+  PFV       Full                                                      +---------+---------------+---------+-----------+----------+-------+   POP       Full            Yes       Yes                             +---------+---------------+---------+-----------+----------+-------+  PTV       Full                                                      +---------+---------------+---------+-----------+----------+-------+  PERO      Full                                                      +---------+---------------+---------+-----------+----------+-------+   +---------+---------------+---------+-----------+----------+--------------+  LEFT      Compressibility Phasicity Spontaneity Properties Summary         +---------+---------------+---------+-----------+----------+--------------+  CFV       Full            Yes       Yes                                    +---------+---------------+---------+-----------+----------+--------------+  SFJ       Full                                                             +---------+---------------+---------+-----------+----------+--------------+  FV Prox   Full                                                             +---------+---------------+---------+-----------+----------+--------------+  FV Mid    Full                                                             +---------+---------------+---------+-----------+----------+--------------+  FV Distal Full            Yes       Yes                                    +---------+---------------+---------+-----------+----------+--------------+  PFV       Full                                                             +---------+---------------+---------+-----------+----------+--------------+  POP       Full            Yes       Yes                                    +---------+---------------+---------+-----------+----------+--------------+  PTV       Full                                                             +---------+---------------+---------+-----------+----------+--------------+  PERO                                                       Not visualized   +---------+---------------+---------+-----------+----------+--------------+     Summary: Right: There is no evidence of deep vein thrombosis in the lower extremity. However, portions of this examination were limited- see technologist comments above. No cystic structure found in the popliteal fossa. Left: There is no evidence of deep vein thrombosis in the lower extremity. However, portions of this examination were limited- see technologist comments above. No cystic structure found in the popliteal fossa.  *See table(s) above for measurements and observations. Electronically signed by Harold Barban MD on 05/02/2019 at 11:09:50 AM.    Final     Lab Data:  CBC: Recent Labs  Lab 05/01/19 0123 05/02/19 0552 05/03/19 0343  WBC 12.4* 9.2 9.2  NEUTROABS 11.0* 7.6 7.4  HGB 13.1 12.6* 12.3*  HCT 41.1 39.1 38.3*  MCV 91.5 90.9 91.4  PLT 195 182 774   Basic Metabolic Panel: Recent Labs  Lab 05/01/19 0123 05/02/19 0552 05/03/19 0343 05/04/19 0514  NA 140 140 140 139  K 4.2 3.6 3.6 4.0  CL 93* 92* 90* 86*  CO2 33* 37* 36* 39*  GLUCOSE 219* 197* 180* 249*  BUN 68* 63* 62* 61*  CREATININE 1.94* 2.01* 1.84* 1.85*  CALCIUM 9.3 9.2 9.5 9.4  MG  --  2.4  --   --    GFR: Estimated Creatinine Clearance: 48.6 mL/min (A) (by C-G formula based on SCr of 1.85 mg/dL (H)). Liver Function Tests: Recent Labs  Lab 04/30/19 2337  AST 16  ALT 16  ALKPHOS 80  BILITOT 0.9  PROT 7.1  ALBUMIN 3.4*   No results for input(s): LIPASE, AMYLASE in the last 168 hours. No results for input(s): AMMONIA in the last 168 hours. Coagulation Profile: No results for input(s): INR, PROTIME in the last 168 hours. Cardiac Enzymes: Recent Labs  Lab 05/01/19 0123  TROPONINI <0.03   BNP (last 3 results) No results for input(s): PROBNP in the last 8760 hours. HbA1C: Recent Labs    05/02/19 0552  HGBA1C 10.0*   CBG: Recent Labs  Lab 05/03/19 1220 05/03/19 1649 05/03/19 2101 05/04/19 0627 05/04/19 1129    GLUCAP 264* 252* 248* 230* 315*   Lipid Profile: No results for input(s): CHOL, HDL, LDLCALC, TRIG, CHOLHDL, LDLDIRECT in the last 72 hours. Thyroid Function Tests: No results for input(s): TSH, T4TOTAL, FREET4, T3FREE, THYROIDAB in the last 72 hours. Anemia Panel: No results for input(s): VITAMINB12, FOLATE, FERRITIN, TIBC, IRON, RETICCTPCT in the last 72 hours. Urine analysis:    Component Value Date/Time   COLORURINE STRAW (A) 05/01/2019 0258   APPEARANCEUR CLEAR 05/01/2019 0258   LABSPEC 1.009 05/01/2019 0258   PHURINE 5.0 05/01/2019 0258   GLUCOSEU 150 (A) 05/01/2019 0258   HGBUR NEGATIVE 05/01/2019 0258   BILIRUBINUR NEGATIVE 05/01/2019 0258   KETONESUR NEGATIVE 05/01/2019 0258   PROTEINUR NEGATIVE 05/01/2019 0258   UROBILINOGEN 0.2 08/22/2015 1820   NITRITE NEGATIVE 05/01/2019 0258   LEUKOCYTESUR MODERATE (A) 05/01/2019 0258     Tjay Velazquez M.D. [facility] 05/04/2019, 1:30 PM  Pager: [phone] Between 7am to 7pm - call Pager - [phone]  After 7pm go to [email or web address] - password [email or web address]  Call night coverage person covering after 7pm

## 2019-05-04 NOTE — Progress Notes (Signed)
Inpatient Rehabilitation Admissions Coordinator  Inpatient Rehab Consult received. I met with patient at the bedside for rehabilitation assessment. We discussed goals and expectations of an inpatient rehab admission.  Patient previously at [facility] 5/19 and progressed to d/c home at that time. He had [employer] last year. His payor has changed to [employer] this year. Pt prefers another inpt rehab admission. I spoke with pt's wife by phone and she is familiar with our venue due to admission last year. She is concerned with pt's medical neccesity to be managed at a SNF level of are and prefers CIR. Pt currently on a Lasix gtt with close follow up by the advanced heart failure team of Dr. [name]. I will follow up tomorrow with his progress and begin insurance authorization with [employer] when pt close to medical readiness to d/c. I feel pt could benefit form an inpt rehab admission at this time due to his medical complexity.  Danne Baxter, RN, MSN Rehab Admissions Coordinator [phone] 05/04/2019 12:00 PM

## 2019-05-04 NOTE — Progress Notes (Addendum)
Advanced Heart Failure Rounding Note   Subjective:    Remains on lasix gtt at 10. Metolazone added yesterday. Diuresis much improved. Negative 4L Weight down 6 pounds. Denies CP or SOB. Bloating improved. Still edematous.    Objective:   Weight Range:  Vital Signs:   Temp:  [97 F (36.1 C)-98.2 F (36.8 C)] 98.2 F (36.8 C) (05/06 0447) Pulse Rate:  [60-74] 72 (05/06 0834) Resp:  [18-20] 18 (05/06 0447) BP: (106-133)/(34-94) 121/94 (05/06 0834) SpO2:  [91 %-97 %] 91 % (05/06 0841) Weight:  [125.2 kg] 125.2 kg (05/06 0635) Last BM Date: 05/02/19  Weight change: Filed Weights   05/02/19 0442 05/03/19 0440 05/04/19 0635  Weight: 130 kg 128.3 kg 125.2 kg    Intake/Output:   Intake/Output Summary (Last 24 hours) at 05/04/2019 0842 Last data filed at 05/04/2019 6767 Gross per 24 hour  Intake 1586.56 ml  Output 3875 ml  Net -2288.44 ml     Physical Exam: General:  Sitting in chair. No resp difficulty HEENT: normal Neck: supple. JVP 9-10. Carotids 2+ bilat; no bruits. No lymphadenopathy or thryomegaly appreciated. Cor: PMI nondisplaced. Regular rate & rhythm. No rubs, gallops or murmurs. Lungs: clear Abdomen: soft, nontender, + distended. No hepatosplenomegaly. No bruits or masses. Good bowel sounds. Extremities: no cyanosis, clubbing, rash, 2+ edema + UNNA Neuro: alert & orientedx3, cranial nerves grossly intact. moves all 4 extremities w/o difficulty. Affect pleasant  Telemetry: NSR 60-70s Personally reviewed   Labs: Basic Metabolic Panel: Recent Labs  Lab 05/01/19 0123 05/02/19 0552 05/03/19 0343 05/04/19 0514  NA 140 140 140 139  K 4.2 3.6 3.6 4.0  CL 93* 92* 90* 86*  CO2 33* 37* 36* 39*  GLUCOSE 219* 197* 180* 249*  BUN 68* 63* 62* 61*  CREATININE 1.94* 2.01* 1.84* 1.85*  CALCIUM 9.3 9.2 9.5 9.4  MG  --  2.4  --   --     Liver Function Tests: Recent Labs  Lab 04/30/19 2337  AST 16  ALT 16  ALKPHOS 80  BILITOT 0.9  PROT 7.1  ALBUMIN 3.4*    No results for input(s): LIPASE, AMYLASE in the last 168 hours. No results for input(s): AMMONIA in the last 168 hours.  CBC: Recent Labs  Lab 05/01/19 0123 05/02/19 0552 05/03/19 0343  WBC 12.4* 9.2 9.2  NEUTROABS 11.0* 7.6 7.4  HGB 13.1 12.6* 12.3*  HCT 41.1 39.1 38.3*  MCV 91.5 90.9 91.4  PLT 195 182 186    Cardiac Enzymes: Recent Labs  Lab 05/01/19 0123  TROPONINI <0.03    BNP: BNP (last 3 results) Recent Labs    10/27/18 1013 03/02/19 1157 05/01/19 0123  BNP 38.1 44.4 87.6    ProBNP (last 3 results) No results for input(s): PROBNP in the last 8760 hours.    Other results:  Imaging: No results found.   Medications:     Scheduled Medications: . aspirin EC  81 mg Oral Daily  . atorvastatin  20 mg Oral q1800  . docusate sodium  200 mg Oral QHS  . doxycycline  100 mg Oral Q12H  . enoxaparin (LOVENOX) injection  65 mg Subcutaneous Q24H  . escitalopram  20 mg Oral Daily  . feeding supplement (PRO-STAT SUGAR FREE 64)  30 mL Oral BID  . fesoterodine  8 mg Oral Daily  . gabapentin  400 mg Oral TID  . insulin aspart  0-9 Units Subcutaneous TID WC  . insulin aspart  3 Units Subcutaneous TID WC  .  insulin glargine  35 Units Subcutaneous QHS  . ipratropium  2 puff Inhalation BID  . levothyroxine  175 mcg Oral Daily  . metolazone  2.5 mg Oral BID  . metoprolol tartrate  12.5 mg Oral BID  . multivitamin with minerals  1 tablet Oral QHS  . polyethylene glycol  17 g Oral QHS  . pramipexole  0.5 mg Oral q morning - 10a  . pramipexole  1 mg Oral QPM  . sodium chloride flush  3 mL Intravenous Q12H  . spironolactone  12.5 mg Oral Daily  . tamsulosin  0.4 mg Oral QHS  . zolpidem  5 mg Oral QHS    Infusions: . sodium chloride 10 mL/hr at 05/03/19 0009  . furosemide (LASIX) infusion 10 mg/hr (05/03/19 1809)  . 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  Admitted with concern for sepsis due to UTI/BLE cellulitis and volume overload.   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 Reviewed by Dr. Mauricia Area determined to be negative. Myeloma panel with no M-spike, normal IFE. - Echo 5/4 EF 60-65%, normal RV - Remains volumeoverloaded on exam. - Increase lasix to 15 mg/hr. Continue metolazone 2.5 bid.  - Reinforced need to limit fluids -Continue spiro 25 daily - Intolerant to [place] with UTI and rash. - Add UNNA boots.   2. ID -> UTI - Blood cx NGTD. UA+ mod leukocytes. Urine cx with multiple species, suggested recollection - Continue Zosyn per primary team. He is also on PO doxy for ?cellulitis.  - Afebrile. WBC ok  3.Chronic Hypoxic Respiratory Failure/COPD - On 3 L O2 qHS at home. Currently on 4 L. Wears BiPAP qHS.   4. Hypokalemia - supp   5. CKD Stage III - Creatinine stable at 1.85 -Monitor closely with diuresis.   6. OSA -ContinueBiPAP qHS - Follows with Dr. Halford Chessman.  7. CAD - Non-obstructive. LHC 08/2015: LM 40, LAD distal 40, LCx ostial 50, RCA proximal 40 -No s/s ischemia. - On ASA/statin  8. DM - Uncontrolled. A1C is 10.0 - Intolerant to [place] with UTI and rash.  9. +D-dimer - BLE dopplers negative for DVT  10. Deconditioning - Consult PT/OT when appropriate.   Length of Stay: 3   Glori Bickers MD 05/04/2019, 8:42 AM  Advanced Heart Failure Team Pager [phone] (M-F; 7a - 4p)  Please contact [facility] for night-coverage after hours (4p -7a ) and weekends on [email or web address]

## 2019-05-04 NOTE — Progress Notes (Addendum)
Physical Therapy Treatment Patient Details Name: Travis [name] MRN: [id number] DOB: 1946/08/26 Today's Date: 05/04/2019    History of Present Illness 73 y.o. male admitted with edema, SOB, CHF exacerbation. PMHx: HTN, HLD, DM, COPD on 2L at home, hypothyroidism, depression, CHF, RT BBB, RLS, OSA on CPAP, dementia, CKD stage III    PT Comments     Continuing work on functional mobility and activity tolerance;  Walked less distance today, with decr standing tolerance; Able to walk 6-8 steps with heavy mod assist and second person pushing chair behind for safety; Noted pt and wife are agreeable to post-acute rehabilitation at CIR, and I am heartily in agreement  Follow Up Recommendations  CIR     Equipment Recommendations  None recommended by PT    Recommendations for Other Services       Precautions / Restrictions Precautions Precautions: Fall    Mobility  Bed Mobility                  Transfers Overall transfer level: Needs assistance Equipment used: Rolling walker (2 wheeled) Transfers: Sit to/from Stand Sit to Stand: Max assist;+2 physical assistance;+2 safety/equipment         General transfer comment: Cues for prepositioning of feet, weight shift forward in prep for standing; Max assist of 2 to powerup  Ambulation/Gait Ambulation/Gait assistance: Mod assist Gait Distance (Feet): 5 Feet Assistive device: Rolling walker (2 wheeled) Gait Pattern/deviations: Step-to pattern;Trunk flexed;Wide base of support;Decreased stride length     General Gait Details: Decr tolerance of sanding today, but able to take 6-8 steps; Able to advacne R foot without assist; requires heavy mod assist and support for weight shift R to allow for L foot advancement   Stairs             Wheelchair Mobility    Modified Rankin (Stroke Patients Only)       Balance     Sitting balance-Leahy Scale: Fair       Standing balance-Leahy Scale: Poor Standing balance  comment: reliant on bil UE support for standing                            Cognition Arousal/Alertness: Awake/alert Behavior During Therapy: WFL for tasks assessed/performed Overall Cognitive Status: Impaired/Different from baseline Area of Impairment: Safety/judgement;Problem solving                         Safety/Judgement: Decreased awareness of safety;Decreased awareness of deficits   Problem Solving: Slow processing;Decreased initiation        Exercises      General Comments        Pertinent Vitals/Pain Pain Assessment: No/denies pain    Home Living                      Prior Function            PT Goals (current goals can now be found in the care plan section) Acute Rehab PT Goals Patient Stated Goal: Hopes to be able to go to CIR PT Goal Formulation: With patient Time For Goal Achievement: 05/16/19 Potential to Achieve Goals: Fair Progress towards PT goals: Progressing toward goals(slowly)    Frequency    Min 3X/week      PT Plan Discharge plan needs to be updated    Co-evaluation              AM-PAC  PT "6 Clicks" Mobility   Outcome Measure  Help needed turning from your back to your side while in a flat bed without using bedrails?: A Lot Help needed moving from lying on your back to sitting on the side of a flat bed without using bedrails?: A Lot Help needed moving to and from a bed to a chair (including a wheelchair)?: A Lot Help needed standing up from a chair using your arms (e.g., wheelchair or bedside chair)?: A Lot Help needed to walk in hospital room?: A Lot Help needed climbing 3-5 steps with a railing? : Total 6 Click Score: 11    End of Session Equipment Utilized During Treatment: Gait belt;Oxygen Activity Tolerance: Patient tolerated treatment well Patient left: in chair;with call bell/phone within reach;with nursing/sitter in room;with chair alarm set Nurse Communication: Mobility  status;Precautions PT Visit Diagnosis: Other abnormalities of gait and mobility (R26.89);Muscle weakness (generalized) (M62.81);Unsteadiness on feet (R26.81);Difficulty in walking, not elsewhere classified (R26.2)     Time: 1610-9604 PT Time Calculation (min) (ACUTE ONLY): 19 min  Charges:  $Gait Training: 8-22 mins                     Roney Marion, [place] Pager [phone] Office [place] 05/04/2019, 2:34 PM

## 2019-05-05 ENCOUNTER — Telehealth (HOSPITAL_COMMUNITY): Payer: PPO

## 2019-05-05 LAB — BASIC METABOLIC PANEL
Anion gap: 10 (ref 5–15)
BUN: 67 mg/dL — ABNORMAL HIGH (ref 8–23)
CO2: 40 mmol/L — ABNORMAL HIGH (ref 22–32)
Calcium: 9.4 mg/dL (ref 8.9–10.3)
Chloride: 89 mmol/L — ABNORMAL LOW (ref 98–111)
Creatinine, Ser: 1.82 mg/dL — ABNORMAL HIGH (ref 0.61–1.24)
GFR calc Af Amer: 41 mL/min — ABNORMAL LOW (ref >60)
GFR calc non Af Amer: 36 mL/min — ABNORMAL LOW (ref >60)
Glucose, Bld: 202 mg/dL — ABNORMAL HIGH (ref 70–99)
Potassium: 3.7 mmol/L (ref 3.5–5.1)
Sodium: 139 mmol/L (ref 135–145)

## 2019-05-05 LAB — GLUCOSE, CAPILLARY
Glucose-Capillary: 305 mg/dL — ABNORMAL HIGH (ref 70–99)
Glucose-Capillary: 238 mg/dL — ABNORMAL HIGH (ref 70–99)
Glucose-Capillary: 171 mg/dL — ABNORMAL HIGH (ref 70–99)
Glucose-Capillary: 227 mg/dL — ABNORMAL HIGH (ref 70–99)
Glucose-Capillary: 163 mg/dL — ABNORMAL HIGH (ref 70–99)
Glucose-Capillary: 200 mg/dL — ABNORMAL HIGH (ref 70–99)

## 2019-05-05 MED ORDER — INSULIN GLARGINE 100 UNIT/ML ~~LOC~~ SOLN
45.0000 [IU] | Freq: Every day | SUBCUTANEOUS | Status: DC
  Administered 2019-05-05: 45 [IU] via SUBCUTANEOUS
  Filled 2019-05-05 (×2): qty 0.45

## 2019-05-05 MED ORDER — ENSURE MAX PROTEIN PO LIQD
11.0000 [oz_av] | Freq: Every day | ORAL | Status: DC
  Administered 2019-05-07: 237 mL via ORAL
  Administered 2019-05-08 – 2019-05-10 (×2): 11 [oz_av] via ORAL
  Filled 2019-05-05 (×7): qty 330

## 2019-05-05 MED ORDER — ORAL CARE MOUTH RINSE
15.0000 mL | Freq: Two times a day (BID) | OROMUCOSAL | Status: DC
  Administered 2019-05-05 – 2019-05-10 (×11): 15 mL via OROMUCOSAL

## 2019-05-05 MED ORDER — POTASSIUM CHLORIDE CRYS ER 20 MEQ PO TBCR
40.0000 meq | EXTENDED_RELEASE_TABLET | Freq: Once | ORAL | Status: AC
  Administered 2019-05-05: 40 meq via ORAL
  Filled 2019-05-05: qty 2

## 2019-05-05 MED ORDER — CHLORHEXIDINE GLUCONATE 0.12 % MT SOLN
15.0000 mL | Freq: Two times a day (BID) | OROMUCOSAL | Status: DC
  Administered 2019-05-05 – 2019-05-11 (×12): 15 mL via OROMUCOSAL
  Filled 2019-05-05 (×13): qty 15

## 2019-05-05 NOTE — Progress Notes (Signed)
Patient was being assisted from chair to bed using walker. Patient started to get weak and decided to sit, but pillow was on edge on chair and he started to slide down. RN and NT helped patient to the floor. No injury, patient was easily assisted to floor. VSS.

## 2019-05-05 NOTE — Progress Notes (Signed)
Nutrition Follow-up  RD working remotely.  DOCUMENTATION CODES:   Obesity unspecified  INTERVENTION:   -D/c 30 ml Prostat BID -Ensure Max q HS, each supplement provides 150 kcals and 30 grams protein -Continue MVI with minerals daily -Diet education provided on 05/02/19; see previous RD note for further details  NUTRITION DIAGNOSIS:   Increased nutrient needs related to wound healing as evidenced by estimated needs.  Ongoing  GOAL:   Patient will meet greater than or equal to 90% of their needs  Progressing  MONITOR:   PO intake, Supplement acceptance, Labs, Weight trends, Skin, I & O's  REASON FOR ASSESSMENT:   Consult Diet education  ASSESSMENT:   Travis [name] is a 73 y.o. male with medical history significant of hypertension, hyperlipidemia, diabetes mellitus, COPD on 2 L nasal cannula oxygen at home, hypothyroidism, depression, CHF, right bundle blockage, RLS, OSA on CPAP, dementia, CKD stage III, who presents with bilateral leg swelling and pain, shortness of breath.  Reviewed I/O's: -2.1L x 24 hours and -9.2 L since admission  UOP: 3.3 L x 24 hours  Pt remains with good appetite. Noted meal completion 100%. He is refusing Prostat supplement.   Per MD notes, pt will require a few more days of diuresis prior to discharge. PT recommending CIR; CIR admissions coordinator following for potential admission.   Labs reviewed: CBGS: [id number] (inpatient orders for glycemic control are 0-15 units insulin aspart TID with meals, 0-5 units insulin aspart q HS, 8 unit insulin aspart TID with meals, and 40 units insulin glargine q HS).   Diet Order:   Diet Order            Diet 2 gram sodium Room service appropriate? Yes; Fluid consistency: Thin  Diet effective now              EDUCATION NEEDS:   Education needs have been addressed  Skin:  Skin Assessment: Skin Integrity Issues: Skin Integrity Issues:: Stage II Stage II: sacrum  Last BM:  05/03/19  Height:    Ht Readings from Last 1 Encounters:  05/01/19 6\' 1"  (1.854 m)    Weight:   Wt Readings from Last 1 Encounters:  05/05/19 128.5 kg    Ideal Body Weight:  83.6 kg  BMI:  Body mass index is 37.36 kg/m.  Estimated Nutritional Needs:   Kcal:  2000-2200  Protein:  105-120 grams  Fluid:  2.0-2.2 L    Socorro Kanitz A. Jimmye Norman, RD, LDN, [place] Registered Dietitian II Certified Diabetes Care and Education Specialist Pager: [phone] After hours Pager: [phone]

## 2019-05-05 NOTE — Care Management Important Message (Signed)
Important Message  Patient Details  Name: Travis [name] MRN: [id number] Date of Birth: 04/25/1946   Medicare Important Message Given:  Yes    [name] 05/05/2019, 2:10 PM

## 2019-05-05 NOTE — Progress Notes (Signed)
Physical Therapy Treatment Patient Details Name: Travis [name] MRN: [id number] DOB: 12-02-1946 Today's Date: 05/05/2019    History of Present Illness 73 y.o. male admitted with edema, SOB, CHF exacerbation. PMHx: HTN, HLD, DM, COPD on 2L at home, hypothyroidism, depression, CHF, RT BBB, RLS, OSA on CPAP, dementia, CKD stage III    PT Comments    Pt up in chair on entry, eager to work with therapy. Focus of session increasing strength and endurance with sit>stand from [place]. Pt maxAx2 for initial sit>stand from lower recliner surface. Once up to [facility] able to sit>stand with modAx2 progressing to minAx2 on last bout. Pt able to perform static standing balance 3x 85min. D/c plans remain appropriate as pt very motivated to improve level of function. PT will continue to follow acutely.    Follow Up Recommendations  CIR     Equipment Recommendations  None recommended by PT       Precautions / Restrictions Precautions Precautions: Fall Restrictions Weight Bearing Restrictions: No    Mobility  Bed Mobility                  Transfers Overall transfer level: Needs assistance Equipment used: None Transfers: Sit to/from Stand Sit to Stand: Max assist;+2 physical assistance;+2 safety/equipment;Mod assist;Min assist         General transfer comment: Cues for prepositioning of feet, weight shift forward in prep for standing; Max assist of 2 to powerup        Balance Overall balance assessment: Needs assistance   Sitting balance-Leahy Scale: Fair       Standing balance-Leahy Scale: Poor Standing balance comment: reliant on bil UE support for standing                            Cognition Arousal/Alertness: Awake/alert Behavior During Therapy: WFL for tasks assessed/performed Overall Cognitive Status: Impaired/Different from baseline Area of Impairment: Safety/judgement;Problem solving                         Safety/Judgement: Decreased  awareness of safety;Decreased awareness of deficits   Problem Solving: Slow processing;Decreased initiation;Requires verbal cues;Requires tactile cues General Comments: increased cuing for sequencing,       Exercises Other Exercises Other Exercises: 5x sit>stand from elevated [place] pads, 3x standing 1 minute     General Comments General comments (skin integrity, edema, etc.): Pt very motivated to work with therapy, therapy on 2L O2 via [place] despite SoB with standing bouts pt maintains SaO2 >90%O2      Pertinent Vitals/Pain Pain Assessment: Faces Faces Pain Scale: Hurts little more Pain Location: R elbow after leaning on it for long period of time sitting in chair Pain Descriptors / Indicators: Discomfort;Grimacing Pain Intervention(s): Limited activity within patient's tolerance;Monitored during session;Premedicated before session;Repositioned           PT Goals (current goals can now be found in the care plan section) Acute Rehab PT Goals Patient Stated Goal: Hopes to be able to go to CIR PT Goal Formulation: With patient Time For Goal Achievement: 05/16/19 Potential to Achieve Goals: Fair    Frequency    Min 3X/week      PT Plan Discharge plan needs to be updated       AM-PAC PT "6 Clicks" Mobility   Outcome Measure  Help needed turning from your back to your side while in a flat bed without using bedrails?: A Lot Help needed  moving from lying on your back to sitting on the side of a flat bed without using bedrails?: A Lot Help needed moving to and from a bed to a chair (including a wheelchair)?: A Lot Help needed standing up from a chair using your arms (e.g., wheelchair or bedside chair)?: A Lot Help needed to walk in hospital room?: A Lot Help needed climbing 3-5 steps with a railing? : Total 6 Click Score: 11    End of Session Equipment Utilized During Treatment: Gait belt;Oxygen Activity Tolerance: Patient tolerated treatment well Patient left: in  chair;with call bell/phone within reach;with nursing/sitter in room;with chair alarm set Nurse Communication: Mobility status;Precautions PT Visit Diagnosis: Other abnormalities of gait and mobility (R26.89);Muscle weakness (generalized) (M62.81);Unsteadiness on feet (R26.81);Difficulty in walking, not elsewhere classified (R26.2)     Time: 5974-1638 PT Time Calculation (min) (ACUTE ONLY): 17 min  Charges:  $Therapeutic Exercise: 8-22 mins                     Adyn Hoes B. Migdalia Dk PT, DPT Acute Rehabilitation Services Pager [phone] Office [phone]    [place] 05/05/2019, 4:13 PM

## 2019-05-05 NOTE — Plan of Care (Signed)
  Problem: Education: Goal: Ability to demonstrate management of disease process will improve Outcome: Progressing   Problem: Education: Goal: Ability to verbalize understanding of medication therapies will improve Outcome: Progressing   

## 2019-05-05 NOTE — Progress Notes (Signed)
RN asked pt who we should call to let them know of assisted fall, pt says not to call and he will tell his family. MD notified.

## 2019-05-05 NOTE — Progress Notes (Addendum)
[facility]                                                                              Patient Demographics  Travis [name], is a 73 y.o. male, DOB - 03/04/1946, CBS:[id number]  Admit date - 04/30/2019   Admitting Physician Ivor Costa, MD  Outpatient Primary MD for the patient is [name], MD  Outpatient specialists:   LOS - 4  days   Medical records reviewed and are as summarized below:    Chief Complaint  Patient presents with   Shortness of Breath       Brief summary   Patient is a 73 year old male with hypertension, hyperlipidemia, diabetes, COPD on 2 L nasal cannula at home, hypothyroidism, CHF, OSA on CPAP, dementia, CKD stage III presented with bilateral leg edema, pain, anasarca and shortness of breath.  His legs were also erythematous. In ED, patient was found to have BNP 87.6, negative troponins, stable renal function, chest x-ray showed low volume and vascular congestion without obvious infiltration. Rapid COVID test negative  Assessment & Plan    Principal Problem:   Acute on chronic diastolic CHF (congestive heart failure) ([place]) -Presented with anasarca, shortness of breath, bilateral lower extremity edema.  Chest x-ray showed pulmonary vascular congestion -CHF team following, on Lasix drip, still volume overloaded, on metolazone 2.5 mg twice daily, spironolactone -Diuresing well.  Negative balance of 9.7 L - weight 380lbs on admission-> 283 today  Active Problems: Lower extremity cellulitis -Blood cultures negative, MRSA screen negative, urine culture with multiple species, was on antibiotics prior to admission for UTI -COVID test negative -Continue doxycycline for cellulitis, Unna boots  Type II IDDM, uncontrolled with CKD -CBGs still elevated 200s.  Increase Lantus to 45 units at bedtime.  Continue meal coverage 8 units 3 times daily AC, sliding scale insulin moderate   CKD stage III -Stable at 1.8, baseline, monitor closely  with diuresis  COPD  -Stable, no wheezing   Depression - cont lexapro, currently stable, no SI, HI   Restless leg syndrome - cont pramipexole   Morbid obesity  BMI 37, recommended diet and weight control  Sacral stage II pressure ulcer, POA Continue wound care  Failure to thrive, generalized debility -Reported falls at home, uses wheelchair/walker at home PT recommended skilled nursing facility however patient and wife declined -CIR evaluating the patient, currently not medically ready  Code Status: Full code DVT Prophylaxis:  Family Communication: Discussed in detail with the patient, all imaging results, lab results explained to the patient and wife on the phone    Disposition Plan: Still very weak, will be difficult to manage at home, still on Lasix drip, not ready for discharge.  CIR evaluating. Wife would like the patient to be in the inpatient rehab when he is ready.  Time Spent in minutes 25 minutes  Procedures:  None  Consultants:   Cardiology CIR  Antimicrobials:   Anti-infectives (From admission, onward)   Start     Dose/Rate Route Frequency Ordered Stop   05/02/19 0500  vancomycin (VANCOCIN) 1,500 mg in sodium chloride 0.9 % 500 mL IVPB  Status:  Discontinued  1,500 mg 250 mL/hr over 120 Minutes Intravenous Every 24 hours 05/01/19 0358 05/01/19 1500   05/01/19 1515  doxycycline (VIBRA-TABS) tablet 100 mg     100 mg Oral Every 12 hours 05/01/19 1500     05/01/19 1200  piperacillin-tazobactam (ZOSYN) IVPB 3.375 g     3.375 g 12.5 mL/hr over 240 Minutes Intravenous Every 8 hours 05/01/19 0358     05/01/19 0400  vancomycin (VANCOCIN) IVPB 1000 mg/200 mL premix     1,000 mg 200 mL/hr over 60 Minutes Intravenous  Once 05/01/19 0355 05/01/19 0544   05/01/19 0245  vancomycin (VANCOCIN) IVPB 1000 mg/200 mL premix  Status:  Discontinued     1,000 mg 200 mL/hr over 60 Minutes Intravenous  Once 05/01/19 0235 05/01/19 0437   05/01/19 0245   piperacillin-tazobactam (ZOSYN) IVPB 3.375 g     3.375 g 100 mL/hr over 30 Minutes Intravenous  Once 05/01/19 0235 05/01/19 0413         Medications  Scheduled Meds:  aspirin EC  81 mg Oral Daily   atorvastatin  20 mg Oral q1800   chlorhexidine  15 mL Mouth Rinse BID   docusate sodium  200 mg Oral QHS   doxycycline  100 mg Oral Q12H   enoxaparin (LOVENOX) injection  60 mg Subcutaneous Q24H   escitalopram  20 mg Oral Daily   fesoterodine  8 mg Oral Daily   gabapentin  400 mg Oral TID   insulin aspart  0-15 Units Subcutaneous TID WC   insulin aspart  0-5 Units Subcutaneous QHS   insulin aspart  8 Units Subcutaneous TID WC   insulin glargine  40 Units Subcutaneous QHS   ipratropium  2 puff Inhalation BID   levothyroxine  175 mcg Oral Daily   mouth rinse  15 mL Mouth Rinse q12n4p   metolazone  2.5 mg Oral BID   metoprolol tartrate  12.5 mg Oral BID   multivitamin with minerals  1 tablet Oral QHS   polyethylene glycol  17 g Oral QHS   potassium chloride  40 mEq Oral Once   pramipexole  0.5 mg Oral q morning - 10a   pramipexole  1 mg Oral QPM   Ensure Max Protein  11 oz Oral QHS   sodium chloride flush  3 mL Intravenous Q12H   spironolactone  12.5 mg Oral Daily   tamsulosin  0.4 mg Oral QHS   Continuous Infusions:  sodium chloride 10 mL/hr at 05/05/19 0526   furosemide (LASIX) infusion 15 mg/hr (05/05/19 0658)   piperacillin-tazobactam (ZOSYN)  IV 3.375 g (05/05/19 0527)   PRN Meds:.sodium chloride, acetaminophen, albuterol, alum & mag hydroxide-simeth, dextromethorphan-guaiFENesin, hydrALAZINE, ondansetron (ZOFRAN) IV, sodium chloride flush, traZODone      Subjective:   Travis [name] was seen and examined today.  Still feels weak and deconditioned.  Otherwise no chest pain.  Shortness of breath is improving.  No fevers.  Patient denies dizziness, chest pain, abdominal pain, N/V/D/C, new weakness, numbess, tingling. No acute events  overnight.    Objective:   Vitals:   05/05/19 0509 05/05/19 0836 05/05/19 0851 05/05/19 1132  BP: 122/60  (!) 114/42 137/66  Pulse: 67  70 62  Resp:  16  19  Temp: 98.4 F (36.9 C)   97.6 F (36.4 C)  TempSrc: Oral   Oral  SpO2: 95%   97%  Weight: 128.5 kg     Height:        Intake/Output Summary (Last 24 hours) at 05/05/2019  Ovid filed at 05/05/2019 1136 Gross per 24 hour  Intake 1221.64 ml  Output 4326 ml  Net -3104.36 ml     Wt Readings from Last 3 Encounters:  05/05/19 128.5 kg  04/15/19 129.7 kg  03/04/19 126.1 kg   Physical Exam  General: Alert and oriented x 3, NAD, much more pleasant today, very deconditioned  Eyes:   HEENT:  Atraumatic, normocephalic  Cardiovascular: S1 S2 clear, RRR. 2+ pedal edema  Respiratory: CTAB, no wheezing, rales or rhonchi  Gastrointestinal: Soft, nontender, nondistended, NBS  Ext: 2+ pedal edema bilaterally  Neuro: no new deficits  Musculoskeletal: No cyanosis, clubbing  Skin: unna +  Psych: Normal affect and demeanor, alert and oriented x3    Data Reviewed:  I have personally reviewed following labs and imaging studies  Micro Results Recent Results (from the past 240 hour(s))  Urine Culture     Status: Abnormal   Collection Time: 05/01/19  2:58 AM  Result Value Ref Range Status   Specimen Description URINE, RANDOM  Final   Special Requests   Final    NONE Performed at [facility], [address] [place], [place] [address]    Culture MULTIPLE SPECIES PRESENT, SUGGEST RECOLLECTION (A)  Final   Report Status 05/02/2019 FINAL  Final  SARS Coronavirus 2 [facility] order, Performed in [place] [facility] lab)     Status: None   Collection Time: 05/01/19  3:24 AM  Result Value Ref Range Status   SARS Coronavirus 2 NEGATIVE NEGATIVE Final    Comment: (NOTE) If result is NEGATIVE SARS-CoV-2 target nucleic acids are NOT DETECTED. The SARS-CoV-2 RNA is generally detectable in upper and lower    respiratory specimens during the acute phase of infection. The lowest  concentration of SARS-CoV-2 viral copies this assay can detect is 250  copies / mL. A negative result does not preclude SARS-CoV-2 infection  and should not be used as the sole basis for treatment or other  patient management decisions.  A negative result may occur with  improper specimen collection / handling, submission of specimen other  than nasopharyngeal swab, presence of viral mutation(s) within the  areas targeted by this assay, and inadequate number of viral copies  (<250 copies / mL). A negative result must be combined with clinical  observations, patient history, and epidemiological information. If result is POSITIVE SARS-CoV-2 target nucleic acids are DETECTED. The SARS-CoV-2 RNA is generally detectable in upper and lower  respiratory specimens dur ing the acute phase of infection.  Positive  results are indicative of active infection with SARS-CoV-2.  Clinical  correlation with patient history and other diagnostic information is  necessary to determine patient infection status.  Positive results do  not rule out bacterial infection or co-infection with other viruses. If result is PRESUMPTIVE POSTIVE SARS-CoV-2 nucleic acids MAY BE PRESENT.   A presumptive positive result was obtained on the submitted specimen  and confirmed on repeat testing.  While 2019 novel coronavirus  (SARS-CoV-2) nucleic acids may be present in the submitted sample  additional confirmatory testing may be necessary for epidemiological  and / or clinical management purposes  to differentiate between  SARS-CoV-2 and other Sarbecovirus currently known to infect humans.  If clinically indicated additional testing with an alternate test  methodology [phone]) is advised. The SARS-CoV-2 RNA is generally  detectable in upper and lower respiratory sp ecimens during the acute  phase of infection. The expected result is Negative. Fact  Sheet for Patients:  StrictlyIdeas.no Fact Sheet for Healthcare Providers: BankingDealers.co.za This test is not yet approved or cleared by the [place] FDA and has been authorized for detection and/or diagnosis of SARS-CoV-2 by FDA under an Emergency Use Authorization (EUA).  This EUA will remain in effect (meaning this test can be used) for the duration of the COVID-19 declaration under Section 564(b)(1) of the Act, 21 U.S.C. section 360bbb-3(b)(1), unless the authorization is terminated or revoked sooner. Performed at [facility], [place] [address], [place], [place] [address]   Culture, blood (Routine X 2) w Reflex to ID Panel     Status: None (Preliminary result)   Collection Time: 05/01/19  3:52 AM  Result Value Ref Range Status   Specimen Description BLOOD RIGHT ARM  Final   Special Requests   Final    BOTTLES DRAWN AEROBIC AND ANAEROBIC Blood Culture results may not be optimal due to an excessive volume of blood received in culture bottles   Culture   Final    NO GROWTH 4 DAYS Performed at [facility], [place] [address], [place], [place] [address]    Report Status PENDING  Incomplete  Culture, blood (Routine X 2) w Reflex to ID Panel     Status: None (Preliminary result)   Collection Time: 05/01/19  3:52 AM  Result Value Ref Range Status   Specimen Description BLOOD LEFT ARM  Final   Special Requests   Final    BOTTLES DRAWN AEROBIC ONLY Blood Culture adequate volume   Culture   Final    NO GROWTH 4 DAYS Performed at [facility], [address] [place], [place] [address]    Report Status PENDING  Incomplete  MRSA PCR Screening     Status: None   Collection Time: 05/01/19  3:23 PM  Result Value Ref Range Status   MRSA by PCR NEGATIVE NEGATIVE Final    Comment:        The GeneXpert MRSA Assay (FDA approved for NASAL specimens only), is one component of a comprehensive MRSA colonization surveillance  program. It is not intended to diagnose MRSA infection nor to guide or monitor treatment for MRSA infections. Performed at [facility], [place] [address], [place], [place] [address]     Radiology Reports Dg Chest Portable 1 View  Result Date: 05/01/2019 CLINICAL DATA:  Shortness of breath EXAM: PORTABLE CHEST 1 VIEW COMPARISON:  11/29/2018 FINDINGS: Very low lung volumes. Bibasilar atelectasis. Vascular congestion. No visible effusions or acute bony abnormality. IMPRESSION: Very low lung volumes with bibasilar atelectasis and vascular congestion. Electronically Signed   By: Rolm Baptise M.D.   On: 05/01/2019 00:50   Vas [place] Lower Extremity Venous (dvt)  Result Date: 05/02/2019  Lower Venous Study Indications: Cellulitis.  Limitations: Poor ultrasound/tissue interface. Performing Technologist: Oliver Hum RVT  Examination Guidelines: A complete evaluation includes B-mode imaging, spectral Doppler, color Doppler, and power Doppler as needed of all accessible portions of each vessel. Bilateral testing is considered an integral part of a complete examination. Limited examinations for reoccurring indications may be performed as noted.  +---------+---------------+---------+-----------+----------+-------+  RIGHT     Compressibility Phasicity Spontaneity Properties Summary  +---------+---------------+---------+-----------+----------+-------+  CFV       Full            Yes       Yes                             +---------+---------------+---------+-----------+----------+-------+  SFJ       Full                                                      +---------+---------------+---------+-----------+----------+-------+  FV Prox   Full                                                      +---------+---------------+---------+-----------+----------+-------+  FV Mid    Full                                                      +---------+---------------+---------+-----------+----------+-------+  FV Distal Full             Yes       Yes                             +---------+---------------+---------+-----------+----------+-------+  PFV       Full                                                      +---------+---------------+---------+-----------+----------+-------+  POP       Full            Yes       Yes                             +---------+---------------+---------+-----------+----------+-------+  PTV       Full                                                      +---------+---------------+---------+-----------+----------+-------+  PERO      Full                                                      +---------+---------------+---------+-----------+----------+-------+   +---------+---------------+---------+-----------+----------+--------------+  LEFT      Compressibility Phasicity Spontaneity Properties Summary         +---------+---------------+---------+-----------+----------+--------------+  CFV       Full            Yes       Yes                                    +---------+---------------+---------+-----------+----------+--------------+  SFJ       Full                                                             +---------+---------------+---------+-----------+----------+--------------+  FV Prox   Full                                                             +---------+---------------+---------+-----------+----------+--------------+  FV Mid    Full                                                             +---------+---------------+---------+-----------+----------+--------------+  FV Distal Full            Yes       Yes                                    +---------+---------------+---------+-----------+----------+--------------+  PFV       Full                                                             +---------+---------------+---------+-----------+----------+--------------+  POP       Full            Yes       Yes                                     +---------+---------------+---------+-----------+----------+--------------+  PTV       Full                                                             +---------+---------------+---------+-----------+----------+--------------+  PERO                                                       Not visualized  +---------+---------------+---------+-----------+----------+--------------+     Summary: Right: There is no evidence of deep vein thrombosis in the lower extremity. However, portions of this examination were limited- see technologist comments above. No cystic structure found in the popliteal fossa. Left: There is no evidence of deep vein thrombosis in the lower extremity. However, portions of this examination were limited- see technologist comments above. No cystic structure found in the popliteal fossa.  *See table(s) above for measurements and observations. Electronically signed by Harold Barban MD on 05/02/2019 at 11:09:50 AM.    Final     Lab Data:  CBC: Recent Labs  Lab 05/01/19 0123 05/02/19 0552 05/03/19 0343  WBC 12.4* 9.2 9.2  NEUTROABS 11.0* 7.6 7.4  HGB 13.1 12.6* 12.3*  HCT 41.1 39.1 38.3*  MCV 91.5 90.9 91.4  PLT 195 182 186  Basic Metabolic Panel: Recent Labs  Lab 05/01/19 0123 05/02/19 0552 05/03/19 0343 05/04/19 0514 05/05/19 0602  NA 140 140 140 139 139  K 4.2 3.6 3.6 4.0 3.7  CL 93* 92* 90* 86* 89*  CO2 33* 37* 36* 39* 40*  GLUCOSE 219* 197* 180* 249* 202*  BUN 68* 63* 62* 61* 67*  CREATININE 1.94* 2.01* 1.84* 1.85* 1.82*  CALCIUM 9.3 9.2 9.5 9.4 9.4  MG  --  2.4  --   --   --    GFR: Estimated Creatinine Clearance: 50 mL/min (A) (by C-G formula based on SCr of 1.82 mg/dL (H)). Liver Function Tests: Recent Labs  Lab 04/30/19 2337  AST 16  ALT 16  ALKPHOS 80  BILITOT 0.9  PROT 7.1  ALBUMIN 3.4*   No results for input(s): LIPASE, AMYLASE in the last 168 hours. No results for input(s): AMMONIA in the last 168 hours. Coagulation Profile: No results for  input(s): INR, PROTIME in the last 168 hours. Cardiac Enzymes: Recent Labs  Lab 05/01/19 0123  TROPONINI <0.03   BNP (last 3 results) No results for input(s): PROBNP in the last 8760 hours. HbA1C: Recent Labs    05/04/19 0514  HGBA1C 10.1*   CBG: Recent Labs  Lab 05/04/19 0627 05/04/19 1129 05/04/19 2111 05/05/19 0629 05/05/19 0756  GLUCAP 230* 315* 239* 238* 171*   Lipid Profile: No results for input(s): CHOL, HDL, LDLCALC, TRIG, CHOLHDL, LDLDIRECT in the last 72 hours. Thyroid Function Tests: No results for input(s): TSH, T4TOTAL, FREET4, T3FREE, THYROIDAB in the last 72 hours. Anemia Panel: No results for input(s): VITAMINB12, FOLATE, FERRITIN, TIBC, IRON, RETICCTPCT in the last 72 hours. Urine analysis:    Component Value Date/Time   COLORURINE STRAW (A) 05/01/2019 0258   APPEARANCEUR CLEAR 05/01/2019 0258   LABSPEC 1.009 05/01/2019 0258   PHURINE 5.0 05/01/2019 0258   GLUCOSEU 150 (A) 05/01/2019 0258   HGBUR NEGATIVE 05/01/2019 0258   BILIRUBINUR NEGATIVE 05/01/2019 0258   KETONESUR NEGATIVE 05/01/2019 0258   PROTEINUR NEGATIVE 05/01/2019 0258   UROBILINOGEN 0.2 08/22/2015 1820   NITRITE NEGATIVE 05/01/2019 0258   LEUKOCYTESUR MODERATE (A) 05/01/2019 0258     Joye Wesenberg M.D. [facility] 05/05/2019, 11:42 AM  Pager: [phone] Between 7am to 7pm - call Pager - [phone]  After 7pm go to [email or web address] - password [email or web address]  Call night coverage person covering after 7pm

## 2019-05-05 NOTE — Progress Notes (Signed)
Pt placed on CPAP full face mask for night time rest tolerated well

## 2019-05-05 NOTE — Progress Notes (Signed)
Advanced Heart Failure Rounding Note   Subjective:    Remains on lasix gtt at 15 and metolazone 2.5 bid. Excellent diuresis. Swelling and bloating improved. Denies SOB or CP. Doing much better job watching fluid intake   Objective:   Weight Range:  Vital Signs:   Temp:  [97.6 F (36.4 C)-98.4 F (36.9 C)] 97.6 F (36.4 C) (05/07 1132) Pulse Rate:  [62-71] 62 (05/07 1132) Resp:  [16-19] 19 (05/07 1132) BP: (101-137)/(42-66) 137/66 (05/07 1132) SpO2:  [95 %-97 %] 97 % (05/07 1132) Weight:  [128.5 kg] 128.5 kg (05/07 0509) Last BM Date: 05/05/19  Weight change: Filed Weights   05/03/19 0440 05/04/19 0635 05/05/19 0509  Weight: 128.3 kg 125.2 kg 128.5 kg    Intake/Output:   Intake/Output Summary (Last 24 hours) at 05/05/2019 1516 Last data filed at 05/05/2019 1509 Gross per 24 hour  Intake 1522.53 ml  Output 4151 ml  Net -2628.47 ml     Physical Exam: General:  Sitting in chair. No resp difficult HEENT: normal Neck: supple. JVP 9-10. Carotids 2+ bilat; no bruits. No lymphadenopathy or thryomegaly appreciated. Cor: PMI nondisplaced. Regular rate & rhythm. No rubs, gallops or murmurs. Lungs: clear Abdomen: obesesoft, nontender, +distended but improved. No hepatosplenomegaly. No bruits or masses. Good bowel sounds. Extremities: no cyanosis, clubbing, rash, 1-2+ edema + UNNA (improved_ Neuro: alert & orientedx3, cranial nerves grossly intact. moves all 4 extremities w/o difficulty. Affect pleasant   Telemetry: NSR  60-70s Personally reviewed   Labs: Basic Metabolic Panel: Recent Labs  Lab 05/01/19 0123 05/02/19 0552 05/03/19 0343 05/04/19 0514 05/05/19 0602  NA 140 140 140 139 139  K 4.2 3.6 3.6 4.0 3.7  CL 93* 92* 90* 86* 89*  CO2 33* 37* 36* 39* 40*  GLUCOSE 219* 197* 180* 249* 202*  BUN 68* 63* 62* 61* 67*  CREATININE 1.94* 2.01* 1.84* 1.85* 1.82*  CALCIUM 9.3 9.2 9.5 9.4 9.4  MG  --  2.4  --   --   --     Liver Function Tests: Recent Labs  Lab  04/30/19 2337  AST 16  ALT 16  ALKPHOS 80  BILITOT 0.9  PROT 7.1  ALBUMIN 3.4*   No results for input(s): LIPASE, AMYLASE in the last 168 hours. No results for input(s): AMMONIA in the last 168 hours.  CBC: Recent Labs  Lab 05/01/19 0123 05/02/19 0552 05/03/19 0343  WBC 12.4* 9.2 9.2  NEUTROABS 11.0* 7.6 7.4  HGB 13.1 12.6* 12.3*  HCT 41.1 39.1 38.3*  MCV 91.5 90.9 91.4  PLT 195 182 186    Cardiac Enzymes: Recent Labs  Lab 05/01/19 0123  TROPONINI <0.03    BNP: BNP (last 3 results) Recent Labs    10/27/18 1013 03/02/19 1157 05/01/19 0123  BNP 38.1 44.4 87.6    ProBNP (last 3 results) No results for input(s): PROBNP in the last 8760 hours.    Other results:  Imaging: No results found.   Medications:     Scheduled Medications: . aspirin EC  81 mg Oral Daily  . atorvastatin  20 mg Oral q1800  . chlorhexidine  15 mL Mouth Rinse BID  . docusate sodium  200 mg Oral QHS  . doxycycline  100 mg Oral Q12H  . enoxaparin (LOVENOX) injection  60 mg Subcutaneous Q24H  . escitalopram  20 mg Oral Daily  . fesoterodine  8 mg Oral Daily  . gabapentin  400 mg Oral TID  . insulin aspart  0-15 Units  Subcutaneous TID WC  . insulin aspart  0-5 Units Subcutaneous QHS  . insulin aspart  8 Units Subcutaneous TID WC  . insulin glargine  45 Units Subcutaneous QHS  . ipratropium  2 puff Inhalation BID  . levothyroxine  175 mcg Oral Daily  . mouth rinse  15 mL Mouth Rinse q12n4p  . metolazone  2.5 mg Oral BID  . metoprolol tartrate  12.5 mg Oral BID  . multivitamin with minerals  1 tablet Oral QHS  . polyethylene glycol  17 g Oral QHS  . pramipexole  0.5 mg Oral q morning - 10a  . pramipexole  1 mg Oral QPM  . Ensure Max Protein  11 oz Oral QHS  . sodium chloride flush  3 mL Intravenous Q12H  . spironolactone  12.5 mg Oral Daily  . tamsulosin  0.4 mg Oral QHS    Infusions: . sodium chloride Stopped (05/05/19 1201)  . furosemide (LASIX) infusion 15 mg/hr  (05/05/19 1509)  . 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  Admitted with concern for sepsis due to UTI/BLE cellulitis and volume overload.   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 Reviewed by Dr. Mauricia Area determined to be negative. Myeloma panel with no M-spike, normal IFE. - Echo 5/4 EF 60-65%, normal RV - Volume status improving but still overloaded.  - Continue lasix 15 mg/hr. Continue metolazone 2.5 bid.  - Reinforced need to limit fluids -Continue spiro 25 daily - Intolerant to [place] with UTI and rash. - ContinueUNNA boots.   2. ID -> UTI - Blood cx NGTD. UA+ mod leukocytes. Urine cx with multiple species, suggested recollection - Continue Zosyn per primary team. He is also on PO doxy for ?cellulitis.  - Afebrile. WBC ok  3.Chronic Hypoxic Respiratory Failure/COPD - On 3 L O2 qHS at home. Currently on 4 L. Wears BiPAP qHS.   4. Hypokalemia - K 3.7 will supp   5. CKD Stage III - Creatinine stable at 1.82 -Monitor closely with diuresis.   6. OSA -ContinueBiPAP qHS - Follows with Dr. Halford Chessman.  7. CAD - Non-obstructive. LHC 08/2015: LM 40, LAD distal 40, LCx ostial 50, RCA proximal 40 -No s/s ischemia. - On ASA/statin  8. DM - Uncontrolled. A1C is 10.0 - Intolerant to [place] with UTI and rash.  9. +D-dimer - BLE dopplers negative for DVT  10. Deconditioning -  Consult PT/OT when appropriate.   Length of Stay: 4   Glori Bickers MD 05/05/2019, 3:16 PM  Advanced Heart Failure Team Pager [phone] (M-F; 7a - 4p)  Please contact [facility] for night-coverage after hours (4p -7a ) and weekends on [email or web address]

## 2019-05-06 DIAGNOSIS — I5030 Unspecified diastolic (congestive) heart failure: Secondary | ICD-10-CM | POA: Diagnosis not present

## 2019-05-06 DIAGNOSIS — I5033 Acute on chronic diastolic (congestive) heart failure: Secondary | ICD-10-CM | POA: Diagnosis not present

## 2019-05-06 DIAGNOSIS — E890 Postprocedural hypothyroidism: Secondary | ICD-10-CM | POA: Diagnosis not present

## 2019-05-06 DIAGNOSIS — I129 Hypertensive chronic kidney disease with stage 1 through stage 4 chronic kidney disease, or unspecified chronic kidney disease: Secondary | ICD-10-CM | POA: Diagnosis not present

## 2019-05-06 DIAGNOSIS — E1129 Type 2 diabetes mellitus with other diabetic kidney complication: Secondary | ICD-10-CM | POA: Diagnosis not present

## 2019-05-06 DIAGNOSIS — N400 Enlarged prostate without lower urinary tract symptoms: Secondary | ICD-10-CM | POA: Diagnosis not present

## 2019-05-06 DIAGNOSIS — N183 Chronic kidney disease, stage 3 (moderate): Secondary | ICD-10-CM | POA: Diagnosis not present

## 2019-05-06 LAB — CBC
HCT: 40 % (ref 39.0–52.0)
Hemoglobin: 12.8 g/dL — ABNORMAL LOW (ref 13.0–17.0)
MCH: 29.1 pg (ref 26.0–34.0)
MCHC: 32 g/dL (ref 30.0–36.0)
MCV: 90.9 fL (ref 80.0–100.0)
Platelets: 210 10*3/uL (ref 150–400)
RBC: 4.4 MIL/uL (ref 4.22–5.81)
RDW: 14.5 % (ref 11.5–15.5)
WBC: 8.3 10*3/uL (ref 4.0–10.5)
nRBC: 0 % (ref 0.0–0.2)

## 2019-05-06 LAB — BASIC METABOLIC PANEL
Anion gap: 17 — ABNORMAL HIGH (ref 5–15)
BUN: 78 mg/dL — ABNORMAL HIGH (ref 8–23)
CO2: 39 mmol/L — ABNORMAL HIGH (ref 22–32)
Calcium: 9.6 mg/dL (ref 8.9–10.3)
Chloride: 83 mmol/L — ABNORMAL LOW (ref 98–111)
Creatinine, Ser: 2.37 mg/dL — ABNORMAL HIGH (ref 0.61–1.24)
GFR calc Af Amer: 30 mL/min — ABNORMAL LOW (ref >60)
GFR calc non Af Amer: 26 mL/min — ABNORMAL LOW (ref >60)
Glucose, Bld: 176 mg/dL — ABNORMAL HIGH (ref 70–99)
Potassium: 4 mmol/L (ref 3.5–5.1)
Sodium: 139 mmol/L (ref 135–145)

## 2019-05-06 LAB — CULTURE, BLOOD (ROUTINE X 2)
Culture: NO GROWTH
Culture: NO GROWTH
Special Requests: ADEQUATE

## 2019-05-06 LAB — GLUCOSE, CAPILLARY
Glucose-Capillary: 175 mg/dL — ABNORMAL HIGH (ref 70–99)
Glucose-Capillary: 268 mg/dL — ABNORMAL HIGH (ref 70–99)
Glucose-Capillary: 186 mg/dL — ABNORMAL HIGH (ref 70–99)
Glucose-Capillary: 140 mg/dL — ABNORMAL HIGH (ref 70–99)

## 2019-05-06 MED ORDER — LOPERAMIDE HCL 2 MG PO CAPS
2.0000 mg | ORAL_CAPSULE | Freq: Once | ORAL | Status: AC
  Administered 2019-05-06: 2 mg via ORAL
  Filled 2019-05-06: qty 1

## 2019-05-06 MED ORDER — INSULIN GLARGINE 100 UNIT/ML ~~LOC~~ SOLN
50.0000 [IU] | Freq: Every day | SUBCUTANEOUS | Status: DC
  Administered 2019-05-06 – 2019-05-10 (×5): 50 [IU] via SUBCUTANEOUS
  Filled 2019-05-06 (×6): qty 0.5

## 2019-05-06 MED ORDER — LOPERAMIDE HCL 2 MG PO CAPS
2.0000 mg | ORAL_CAPSULE | Freq: Three times a day (TID) | ORAL | Status: DC | PRN
  Administered 2019-05-08 (×2): 2 mg via ORAL
  Filled 2019-05-06 (×2): qty 1

## 2019-05-06 MED ORDER — INSULIN ASPART 100 UNIT/ML ~~LOC~~ SOLN
12.0000 [IU] | Freq: Three times a day (TID) | SUBCUTANEOUS | Status: DC
  Administered 2019-05-06 – 2019-05-11 (×15): 12 [IU] via SUBCUTANEOUS

## 2019-05-06 NOTE — Progress Notes (Addendum)
Inpatient Rehabilitation Admissions Coordinator  Notified by Dr. Tana Coast that patient's lasix IV to be d/c'd today due to rise in creatinine. Noted cardiology to hold diuretics and monitor over the weekend. We will follow up Monday and if medically ready will begin insurance authorization for a possible admit to CIR.  Danne Baxter, RN, MSN Rehab Admissions Coordinator [phone] 05/06/2019 3:34 PM

## 2019-05-06 NOTE — Progress Notes (Signed)
RT assisted patient with placing home CPAP mask and machine.  Patient is tolerating at this time.

## 2019-05-06 NOTE — Progress Notes (Signed)
Physical Therapy Treatment Patient Details Name: Travis [name] MRN: [id number] DOB: Jul 11, 1946 Today's Date: 05/06/2019    History of Present Illness 73 y.o. male admitted with edema, SOB, CHF exacerbation. PMHx: HTN, HLD, DM, COPD on 2L at home, hypothyroidism, depression, CHF, RT BBB, RLS, OSA on CPAP, dementia, CKD stage III    PT Comments    Pt very eager to make progress with therapy, however continues to be limited in safe mobility by decreased quad and glut strength required for power up into standing. Pt requires maxAx2 for power up from bed/chair level to standing and maxAx2 for two bouts of ambulation 8 and 6 feet respectively. Given pt's motivation d/c plans remain appropriate. PT will continue to follow acutely.    Follow Up Recommendations  CIR     Equipment Recommendations  None recommended by PT       Precautions / Restrictions Precautions Precautions: Fall Restrictions Weight Bearing Restrictions: No    Mobility  Bed Mobility               General bed mobility comments: seated EoB on arrival   Transfers Overall transfer level: Needs assistance Equipment used: Rolling walker (2 wheeled) Transfers: Sit to/from Stand Sit to Stand: Max assist;+2 physical assistance;+2 safety/equipment;Mod assist;From elevated surface         General transfer comment: modA for power up to [place] from elevated bed, maxAx2 for power up from recliner to RW x2   Ambulation/Gait Ambulation/Gait assistance: Max assist;+2 physical assistance;+2 safety/equipment Gait Distance (Feet): 8 Feet(1x8, 1x6) Assistive device: Rolling walker (2 wheeled) Gait Pattern/deviations: Step-to pattern;Trunk flexed;Wide base of support;Decreased stride length Gait velocity: slowed Gait velocity interpretation: <1.8 ft/sec, indicate of risk for recurrent falls General Gait Details: pt requires increased verbal and tactile cuing for upright posture and anterior pelvic tilt to maximize LE  advancement        Balance Overall balance assessment: Needs assistance   Sitting balance-Leahy Scale: Fair       Standing balance-Leahy Scale: Poor Standing balance comment: reliant on bil UE support for standing                            Cognition Arousal/Alertness: Awake/alert Behavior During Therapy: WFL for tasks assessed/performed Overall Cognitive Status: Impaired/Different from baseline Area of Impairment: Safety/judgement;Problem solving                         Safety/Judgement: Decreased awareness of safety;Decreased awareness of deficits   Problem Solving: Slow processing;Decreased initiation;Requires verbal cues;Requires tactile cues General Comments: increased cuing for sequencing,       Exercises          Pertinent Vitals/Pain Pain Assessment: Faces Faces Pain Scale: Hurts a little bit Pain Location: back Pain Descriptors / Indicators: Aching           PT Goals (current goals can now be found in the care plan section) Acute Rehab PT Goals Patient Stated Goal: Hopes to be able to go to CIR PT Goal Formulation: With patient Time For Goal Achievement: 05/16/19 Potential to Achieve Goals: Fair Progress towards PT goals: Progressing toward goals    Frequency    Min 3X/week      PT Plan Discharge plan needs to be updated       AM-PAC PT "6 Clicks" Mobility   Outcome Measure  Help needed turning from your back to your side while in a flat  bed without using bedrails?: A Lot Help needed moving from lying on your back to sitting on the side of a flat bed without using bedrails?: A Lot Help needed moving to and from a bed to a chair (including a wheelchair)?: A Lot Help needed standing up from a chair using your arms (e.g., wheelchair or bedside chair)?: A Lot Help needed to walk in hospital room?: A Lot Help needed climbing 3-5 steps with a railing? : Total 6 Click Score: 11    End of Session Equipment Utilized  During Treatment: Gait belt;Oxygen Activity Tolerance: Patient tolerated treatment well Patient left: in chair;with call bell/phone within reach;with nursing/sitter in room;with chair alarm set Nurse Communication: Mobility status;Precautions PT Visit Diagnosis: Other abnormalities of gait and mobility (R26.89);Muscle weakness (generalized) (M62.81);Unsteadiness on feet (R26.81);Difficulty in walking, not elsewhere classified (R26.2)     Time: 0932-1000 PT Time Calculation (min) (ACUTE ONLY): 28 min  Charges:  $Gait Training: 23-37 mins                     Tatijana Bierly B. Migdalia Dk PT, DPT Acute Rehabilitation Services Pager [phone] Office [phone]    [place] 05/06/2019, 12:29 PM

## 2019-05-06 NOTE — Progress Notes (Deleted)
Inpatient Rehabilitation Admissions Coordinator  I met with patient and then discussed with Dr. Posey Pronto and Dr. Erlinda Hong. We will admit pt to inpt rehab today. I will make the arrangements to admit today. RN CM, Hassan Rowan, and SW, Kathlee Nations, are aware of admission.  Danne Baxter, RN, MSN Rehab Admissions Coordinator [phone] 05/06/2019 12:06 PM

## 2019-05-06 NOTE — Progress Notes (Signed)
Occupational Therapy Treatment Patient Details Name: Travis [name] MRN: [id number] DOB: 1946-02-03 Today's Date: 05/06/2019    History of present illness 73 y.o. male admitted with edema, SOB, CHF exacerbation. PMHx: HTN, HLD, DM, COPD on 2L at home, hypothyroidism, depression, CHF, RT BBB, RLS, OSA on CPAP, dementia, CKD stage III   OT comments  Pt. Motivated and eager for participation in skilled OT.  Reports feeling weak and speaks about assisted fall last night. "I just feel weak and was giving out".  Attempted sit/stand in preparation for functional mobility. Pt. 3x attempt to transition into standing and then only able to stand approx. 5 seconds.  Reports having a lot of DME at home that lifts hime ie: bed and chair.  Also states wife assists with LB ADLS.  Eager for continued participation and reports wanting to be oob.    Follow Up Recommendations  CIR;Supervision/Assistance - 24 hour    Equipment Recommendations       Recommendations for Other Services Rehab consult    Precautions / Restrictions Precautions Precautions: Fall       Mobility Bed Mobility               General bed mobility comments: seated eob upon arrival and at end of session  Transfers Overall transfer level: Needs assistance Equipment used: Rolling walker (2 wheeled) Transfers: Sit to/from Stand Sit to Stand: Max assist         General transfer comment: Cues for prepositioning of feet, weight shift forward in prep for standing; Max assist to powerup stood approx. 5 seconds before saying "im going down". max cues to reach back for bed while sitting down for control.      Balance                                           ADL either performed or assessed with clinical judgement   ADL Overall ADL's : Needs assistance/impaired                       Lower Body Dressing Details (indicate cue type and reason): declined return demo/review states he has never been  able to reach his feet and his wife dons socks and shoes for him             Functional mobility during ADLs: Maximal assistance General ADL Comments: attempted sit/stand from eob. pt. reports he has an adjustable bed that has ability to raise and lower height.  height of bed adj. and pt. with 3x attempts to transition into standing with max and heavy reliance on RW     Vision       Perception     Praxis      Cognition Arousal/Alertness: Awake/alert Behavior During Therapy: [facility] for tasks assessed/performed                                            Exercises     Shoulder Instructions       General Comments      Pertinent Vitals/ Pain       Faces Pain Scale: Hurts a little bit Pain Location: back Pain Descriptors / Indicators: [place]  Prior Functioning/Environment              Frequency           Progress Toward Goals  OT Goals(current goals can now be found in the care plan section)  Progress towards OT goals: Progressing toward goals     Plan Discharge plan remains appropriate    Co-evaluation                 AM-PAC OT "6 Clicks" Daily Activity     Outcome Measure   Help from another person eating meals?: None Help from another person taking care of personal grooming?: None Help from another person toileting, which includes using toliet, bedpan, or urinal?: A Lot Help from another person bathing (including washing, rinsing, drying)?: A Lot Help from another person to put on and taking off regular upper body clothing?: A Little Help from another person to put on and taking off regular lower body clothing?: A Lot 6 Click Score: 17    End of Session Equipment Utilized During Treatment: Gait belt;Rolling walker  OT Visit Diagnosis: Unsteadiness on feet (R26.81);Other abnormalities of gait and mobility (R26.89);Muscle weakness (generalized)  (M62.81)   Activity Tolerance Patient tolerated treatment well   Patient Left in bed;with call bell/phone within reach;with bed alarm set   Nurse Communication          Time: 6333-5456 OT Time Calculation (min): 24 min  Charges: OT General Charges $OT Visit: 1 Visit OT Treatments $Self Care/Home Management : 23-37 mins   Janice Coffin, COTA/L 05/06/2019, 11:34 AM

## 2019-05-06 NOTE — Progress Notes (Addendum)
[facility]                                                                              Patient Demographics  Travis [name], is a 73 y.o. male, DOB - [birth year], HEN:[id number]  Admit date - 04/30/2019   Admitting Physician Travis Costa, MD  Outpatient Primary MD for the patient is Travis Arabian, MD  Outpatient specialists:   LOS - 5  days   Medical records reviewed and are as summarized below:    Chief Complaint  Patient presents with   Shortness of Breath       Brief summary   Patient is a 73 year old male with hypertension, hyperlipidemia, diabetes, COPD on 2 L nasal cannula at home, hypothyroidism, CHF, OSA on CPAP, dementia, CKD stage III presented with bilateral leg edema, pain, anasarca and shortness of breath.  His legs were also erythematous. In ED, patient was found to have BNP 87.6, negative troponins, stable renal function, chest x-ray showed low volume and vascular congestion without obvious infiltration. Rapid COVID test negative  Assessment & Plan    Principal Problem:   Acute on chronic diastolic CHF (congestive heart failure) ([place]) -Presented with anasarca, shortness of breath, bilateral lower extremity edema.  Chest x-ray showed pulmonary vascular congestion -CHF team following, on Lasix drip, creatinine now trended up to 2.3.  Metolazone discontinued, Lasix drip held by cardiology..   -BP soft in low 90s, hold metoprolol and spironolactone -Will await cardiology recommendations regarding Lasix drip -Diuresing well.  Negative balance of 11.3 L - weight 380lbs on admission-> 279lbs today  Active Problems: Lower extremity cellulitis -Blood cultures negative, MRSA screen negative, urine culture with multiple species, was on antibiotics prior to admission for UTI -COVID test negative -Continue doxycycline for cellulitis, Unna boots -Bilateral lower extremities look much better.  DC Zosyn.  Continue doxycycline.  Type II IDDM,  uncontrolled with CKD -CBGs still elevated, increase Lantus to 15 units at bedtime, increase meal coverage to 10 units 3 times daily AC, continue sliding scale insulin  Acute on CKD stage III -Creatinine trended up to 2.3, likely due to aggressive diuresis  -Metolazone discontinued   COPD  -Stable, no wheezing   Depression - cont lexapro, currently stable, no SI, HI   Restless leg syndrome - cont pramipexole   Morbid obesity  BMI 37, recommended diet and weight control  Sacral stage II pressure ulcer, POA Continue wound care  Failure to thrive, generalized debility -Reported falls at home, uses wheelchair/walker at home PT recommended skilled nursing facility however patient and wife declined -CIR evaluating the patient, currently not medically ready  Diarrhea Stop MiraLAX scheduled, placed on Imodium x1  Code Status: Full code DVT Prophylaxis:  Family Communication: Discussed in detail with the patient, all imaging results, lab results explained to the patient and patient's wife on the phone   Disposition Plan: Still very weak, will be difficult to manage at home, still on Lasix drip, not ready for discharge.  CIR evaluating. Wife would like the patient to be in the inpatient rehab when he is ready. Discussed with CIR, Ms Travis [name], will start insurance approval on Monday morning  Time Spent in minutes 25 minutes  Procedures:  None  Consultants:   Cardiology CIR  Antimicrobials:   Anti-infectives (From admission, onward)   Start     Dose/Rate Route Frequency Ordered Stop   05/02/19 0500  vancomycin (VANCOCIN) 1,500 mg in sodium chloride 0.9 % 500 mL IVPB  Status:  Discontinued     1,500 mg 250 mL/hr over 120 Minutes Intravenous Every 24 hours 05/01/19 0358 05/01/19 1500   05/01/19 1515  doxycycline (VIBRA-TABS) tablet 100 mg     100 mg Oral Every 12 hours 05/01/19 1500     05/01/19 1200  piperacillin-tazobactam (ZOSYN) IVPB 3.375 g  Status:  Discontinued       3.375 g 12.5 mL/hr over 240 Minutes Intravenous Every 8 hours 05/01/19 0358 05/06/19 0816   05/01/19 0400  vancomycin (VANCOCIN) IVPB 1000 mg/200 mL premix     1,000 mg 200 mL/hr over 60 Minutes Intravenous  Once 05/01/19 0355 05/01/19 0544   05/01/19 0245  vancomycin (VANCOCIN) IVPB 1000 mg/200 mL premix  Status:  Discontinued     1,000 mg 200 mL/hr over 60 Minutes Intravenous  Once 05/01/19 0235 05/01/19 0437   05/01/19 0245  piperacillin-tazobactam (ZOSYN) IVPB 3.375 g     3.375 g 100 mL/hr over 30 Minutes Intravenous  Once 05/01/19 0235 05/01/19 0413         Medications  Scheduled Meds:  aspirin EC  81 mg Oral Daily   atorvastatin  20 mg Oral q1800   chlorhexidine  15 mL Mouth Rinse BID   docusate sodium  200 mg Oral QHS   doxycycline  100 mg Oral Q12H   enoxaparin (LOVENOX) injection  60 mg Subcutaneous Q24H   escitalopram  20 mg Oral Daily   fesoterodine  8 mg Oral Daily   gabapentin  400 mg Oral TID   insulin aspart  0-15 Units Subcutaneous TID WC   insulin aspart  0-5 Units Subcutaneous QHS   insulin aspart  12 Units Subcutaneous TID WC   insulin glargine  50 Units Subcutaneous QHS   ipratropium  2 puff Inhalation BID   levothyroxine  175 mcg Oral Daily   mouth rinse  15 mL Mouth Rinse q12n4p   metoprolol tartrate  12.5 mg Oral BID   multivitamin with minerals  1 tablet Oral QHS   pramipexole  0.5 mg Oral q morning - 10a   pramipexole  1 mg Oral QPM   Ensure Max Protein  11 oz Oral QHS   sodium chloride flush  3 mL Intravenous Q12H   spironolactone  12.5 mg Oral Daily   tamsulosin  0.4 mg Oral QHS   Continuous Infusions:  sodium chloride 250 mL (05/05/19 1528)   PRN Meds:.sodium chloride, acetaminophen, albuterol, alum & mag hydroxide-simeth, dextromethorphan-guaiFENesin, hydrALAZINE, ondansetron (ZOFRAN) IV, sodium chloride flush, traZODone      Subjective:   Travis [name] was seen and examined today.  Patient feels swelling  is better, still is weak and deconditioned.  No chest pain.  Had diarrhea earlier this morning x3.  Shortness of breath is improving. Patient denies dizziness, abdominal pain, N/V/D/C, new weakness, numbess, tingling. No acute events overnight.    Objective:   Vitals:   05/06/19 0543 05/06/19 0831 05/06/19 0836 05/06/19 1126  BP: 120/63 129/74 129/74 (!) 99/45  Pulse: (!) 58 65 65 (!) 54  Resp: 18   20  Temp: (!) 97.5 F (36.4 C)     TempSrc:      SpO2: 93%  92% 95%  Weight:      Height:        Intake/Output Summary (Last 24 hours) at 05/06/2019 1249 Last data filed at 05/06/2019 1224 Gross per 24 hour  Intake 650.89 ml  Output 2351 ml  Net -1700.11 ml     Wt Readings from Last 3 Encounters:  05/06/19 126.9 kg  04/15/19 129.7 kg  03/04/19 126.1 kg   Physical Exam  General: Alert and oriented x 3, NAD  Eyes: PERRLA, EOMI, Anicteric Sclera,  HEENT:  Atraumatic, normocephalic  Cardiovascular: S1 S2 clear, no murmurs, RRR. 1+ pedal edema b/l  Respiratory: CTAB, no wheezing, rales or rhonchi  Gastrointestinal: obese Soft, nontender, nondistended, NBS  Ext: 1+ pedal edema bilaterally  Neuro: no new deficits  Musculoskeletal: No cyanosis, clubbing  Skin: UNNA  +  Psych: Normal affect and demeanor, alert and oriented x3    Data Reviewed:  I have personally reviewed following labs and imaging studies  Micro Results Recent Results (from the past 240 hour(s))  Urine Culture     Status: Abnormal   Collection Time: 05/01/19  2:58 AM  Result Value Ref Range Status   Specimen Description URINE, RANDOM  Final   Special Requests   Final    NONE Performed at [facility], [address] [place], [place] [address]    Culture MULTIPLE SPECIES PRESENT, SUGGEST RECOLLECTION (A)  Final   Report Status 05/02/2019 FINAL  Final  SARS Coronavirus 2 [facility] order, Performed in [place] hospital lab)     Status: None   Collection Time: 05/01/19  3:24 AM  Result  Value Ref Range Status   SARS Coronavirus 2 NEGATIVE NEGATIVE Final    Comment: (NOTE) If result is NEGATIVE SARS-CoV-2 target nucleic acids are NOT DETECTED. The SARS-CoV-2 RNA is generally detectable in upper and lower  respiratory specimens during the acute phase of infection. The lowest  concentration of SARS-CoV-2 viral copies this assay can detect is 250  copies / mL. A negative result does not preclude SARS-CoV-2 infection  and should not be used as the sole basis for treatment or other  patient management decisions.  A negative result may occur with  improper specimen collection / handling, submission of specimen other  than nasopharyngeal swab, presence of viral mutation(s) within the  areas targeted by this assay, and inadequate number of viral copies  (<250 copies / mL). A negative result must be combined with clinical  observations, patient history, and epidemiological information. If result is POSITIVE SARS-CoV-2 target nucleic acids are DETECTED. The SARS-CoV-2 RNA is generally detectable in upper and lower  respiratory specimens dur ing the acute phase of infection.  Positive  results are indicative of active infection with SARS-CoV-2.  Clinical  correlation with patient history and other diagnostic information is  necessary to determine patient infection status.  Positive results do  not rule out bacterial infection or co-infection with other viruses. If result is PRESUMPTIVE POSTIVE SARS-CoV-2 nucleic acids MAY BE PRESENT.   A presumptive positive result was obtained on the submitted specimen  and confirmed on repeat testing.  While 2019 novel coronavirus  (SARS-CoV-2) nucleic acids may be present in the submitted sample  additional confirmatory testing may be necessary for epidemiological  and / or clinical management purposes  to differentiate between  SARS-CoV-2 and other Sarbecovirus currently known to infect humans.  If clinically indicated additional testing  with an alternate test  methodology [phone]) is advised. The SARS-CoV-2 RNA is generally  detectable in upper and lower respiratory sp ecimens during the acute  phase of infection. The expected result is Negative. Fact Sheet for Patients:  StrictlyIdeas.no Fact Sheet for Healthcare Providers: BankingDealers.co.za This test is not yet approved or cleared by the [place] FDA and has been authorized for detection and/or diagnosis of SARS-CoV-2 by FDA under an Emergency Use Authorization (EUA).  This EUA will remain in effect (meaning this test can be used) for the duration of the COVID-19 declaration under Section 564(b)(1) of the Act, 21 U.S.C. section 360bbb-3(b)(1), unless the authorization is terminated or revoked sooner. Performed at [facility], [place] [address], [place], [place] [address]   Culture, blood (Routine X 2) w Reflex to ID Panel     Status: None   Collection Time: 05/01/19  3:52 AM  Result Value Ref Range Status   Specimen Description BLOOD RIGHT ARM  Final   Special Requests   Final    BOTTLES DRAWN AEROBIC AND ANAEROBIC Blood Culture results may not be optimal due to an excessive volume of blood received in culture bottles   Culture   Final    NO GROWTH 5 DAYS Performed at [facility], [place] [address], [place], [place] [address]    Report Status 05/06/2019 FINAL  Final  Culture, blood (Routine X 2) w Reflex to ID Panel     Status: None   Collection Time: 05/01/19  3:52 AM  Result Value Ref Range Status   Specimen Description BLOOD LEFT ARM  Final   Special Requests   Final    BOTTLES DRAWN AEROBIC ONLY Blood Culture adequate volume   Culture   Final    NO GROWTH 5 DAYS Performed at [facility], [place] [address], [place], [place] [address]    Report Status 05/06/2019 FINAL  Final  MRSA PCR Screening     Status: None   Collection Time: 05/01/19  3:23 PM  Result Value Ref Range Status   MRSA  by PCR NEGATIVE NEGATIVE Final    Comment:        The GeneXpert MRSA Assay (FDA approved for NASAL specimens only), is one component of a comprehensive MRSA colonization surveillance program. It is not intended to diagnose MRSA infection nor to guide or monitor treatment for MRSA infections. Performed at [facility], [place] [address], [place], [place] [address]     Radiology Reports Dg Chest Portable 1 View  Result Date: 05/01/2019 CLINICAL DATA:  Shortness of breath EXAM: PORTABLE CHEST 1 VIEW COMPARISON:  11/29/2018 FINDINGS: Very low lung volumes. Bibasilar atelectasis. Vascular congestion. No visible effusions or acute bony abnormality. IMPRESSION: Very low lung volumes with bibasilar atelectasis and vascular congestion. Electronically Signed   By: Rolm Baptise M.D.   On: 05/01/2019 00:50   Vas [place] Lower Extremity Venous (dvt)  Result Date: 05/02/2019  Lower Venous Study Indications: Cellulitis.  Limitations: Poor ultrasound/tissue interface. Performing Technologist: Oliver Hum RVT  Examination Guidelines: A complete evaluation includes B-mode imaging, spectral Doppler, color Doppler, and power Doppler as needed of all accessible portions of each vessel. Bilateral testing is considered an integral part of a complete examination. Limited examinations for reoccurring indications may be performed as noted.  +---------+---------------+---------+-----------+----------+-------+  RIGHT     Compressibility Phasicity Spontaneity Properties Summary  +---------+---------------+---------+-----------+----------+-------+  CFV       Full            Yes       Yes                             +---------+---------------+---------+-----------+----------+-------+  SFJ       Full                                                      +---------+---------------+---------+-----------+----------+-------+  FV Prox   Full                                                       +---------+---------------+---------+-----------+----------+-------+  FV Mid    Full                                                      +---------+---------------+---------+-----------+----------+-------+  FV Distal Full            Yes       Yes                             +---------+---------------+---------+-----------+----------+-------+  PFV       Full                                                      +---------+---------------+---------+-----------+----------+-------+  POP       Full            Yes       Yes                             +---------+---------------+---------+-----------+----------+-------+  PTV       Full                                                      +---------+---------------+---------+-----------+----------+-------+  PERO      Full                                                      +---------+---------------+---------+-----------+----------+-------+   +---------+---------------+---------+-----------+----------+--------------+  LEFT      Compressibility Phasicity Spontaneity Properties Summary         +---------+---------------+---------+-----------+----------+--------------+  CFV       Full            Yes       Yes                                    +---------+---------------+---------+-----------+----------+--------------+  SFJ       Full                                                             +---------+---------------+---------+-----------+----------+--------------+  FV Prox   Full                                                             +---------+---------------+---------+-----------+----------+--------------+  FV Mid    Full                                                             +---------+---------------+---------+-----------+----------+--------------+  FV Distal Full            Yes       Yes                                    +---------+---------------+---------+-----------+----------+--------------+  PFV       Full                                                              +---------+---------------+---------+-----------+----------+--------------+  POP       Full            Yes       Yes                                    +---------+---------------+---------+-----------+----------+--------------+  PTV       Full                                                             +---------+---------------+---------+-----------+----------+--------------+  PERO                                                       Not visualized  +---------+---------------+---------+-----------+----------+--------------+     Summary: Right: There is no evidence of deep vein thrombosis in the lower extremity. However, portions of this examination were limited- see technologist comments above. No cystic structure found in the popliteal fossa. Left: There is no evidence of deep vein thrombosis in the lower extremity. However, portions of this examination were limited- see technologist comments above. No cystic structure found in the popliteal fossa.  *See table(s) above for measurements and observations. Electronically signed by Harold Barban MD on 05/02/2019 at 11:09:50 AM.    Final     Lab Data:  CBC: Recent Labs  Lab 05/01/19 0123 05/02/19 0552 05/03/19 0343 05/06/19 0302  WBC 12.4* 9.2 9.2 8.3  NEUTROABS 11.0* 7.6 7.4  --   HGB 13.1 12.6* 12.3* 12.8*  HCT 41.1 39.1 38.3* 40.0  MCV 91.5 90.9  91.4 90.9  PLT 195 182 186 160   Basic Metabolic Panel: Recent Labs  Lab 05/02/19 0552 05/03/19 0343 05/04/19 0514 05/05/19 0602 05/06/19 0302  NA 140 140 139 139 139  K 3.6 3.6 4.0 3.7 4.0  CL 92* 90* 86* 89* 83*  CO2 37* 36* 39* 40* 39*  GLUCOSE 197* 180* 249* 202* 176*  BUN 63* 62* 61* 67* 78*  CREATININE 2.01* 1.84* 1.85* 1.82* 2.37*  CALCIUM 9.2 9.5 9.4 9.4 9.6  MG 2.4  --   --   --   --    GFR: Estimated Creatinine Clearance: 38.2 mL/min (A) (by C-G formula based on SCr of 2.37 mg/dL (H)). Liver Function Tests: Recent Labs  Lab 04/30/19 2337  AST 16  ALT 16    ALKPHOS 80  BILITOT 0.9  PROT 7.1  ALBUMIN 3.4*   No results for input(s): LIPASE, AMYLASE in the last 168 hours. No results for input(s): AMMONIA in the last 168 hours. Coagulation Profile: No results for input(s): INR, PROTIME in the last 168 hours. Cardiac Enzymes: Recent Labs  Lab 05/01/19 0123  TROPONINI <0.03   BNP (last 3 results) No results for input(s): PROBNP in the last 8760 hours. HbA1C: Recent Labs    05/04/19 0514  HGBA1C 10.1*   CBG: Recent Labs  Lab 05/05/19 1129 05/05/19 1606 05/05/19 2152 05/06/19 0614 05/06/19 1144  GLUCAP 227* 163* 200* 175* 268*   Lipid Profile: No results for input(s): CHOL, HDL, LDLCALC, TRIG, CHOLHDL, LDLDIRECT in the last 72 hours. Thyroid Function Tests: No results for input(s): TSH, T4TOTAL, FREET4, T3FREE, THYROIDAB in the last 72 hours. Anemia Panel: No results for input(s): VITAMINB12, FOLATE, FERRITIN, TIBC, IRON, RETICCTPCT in the last 72 hours. Urine analysis:    Component Value Date/Time   COLORURINE STRAW (A) 05/01/2019 0258   APPEARANCEUR CLEAR 05/01/2019 0258   LABSPEC 1.009 05/01/2019 0258   PHURINE 5.0 05/01/2019 0258   GLUCOSEU 150 (A) 05/01/2019 0258   HGBUR NEGATIVE 05/01/2019 0258   BILIRUBINUR NEGATIVE 05/01/2019 0258   KETONESUR NEGATIVE 05/01/2019 0258   PROTEINUR NEGATIVE 05/01/2019 0258   UROBILINOGEN 0.2 08/22/2015 1820   NITRITE NEGATIVE 05/01/2019 0258   LEUKOCYTESUR MODERATE (A) 05/01/2019 0258     Maysoon Lozada M.D. [facility] 05/06/2019, 12:49 PM  Pager: [phone] Between 7am to 7pm - call Pager - [phone]  After 7pm go to [email or web address] - password [email or web address]  Call night coverage person covering after 7pm

## 2019-05-06 NOTE — Progress Notes (Signed)
Advanced Heart Failure Rounding Note   Subjective:    Lasix gtt and metolazone stopped early this am due to AKI.  Feels better but very weak. Denies SOB< orthopnea or PND    Objective:   Weight Range:  Vital Signs:   Temp:  [97.2 F (36.2 C)-98.5 F (36.9 C)] 97.2 F (36.2 C) (05/08 1126) Pulse Rate:  [54-74] 54 (05/08 1126) Resp:  [18-20] 20 (05/08 1126) BP: (99-143)/(45-74) 99/45 (05/08 1126) SpO2:  [92 %-98 %] 95 % (05/08 1126) Weight:  [126.9 kg] 126.9 kg (05/08 0329) Last BM Date: 05/05/19  Weight change: Filed Weights   05/04/19 0635 05/05/19 0509 05/06/19 0329  Weight: 125.2 kg 128.5 kg 126.9 kg    Intake/Output:   Intake/Output Summary (Last 24 hours) at 05/06/2019 1422 Last data filed at 05/06/2019 1250 Gross per 24 hour  Intake 1050.89 ml  Output 2351 ml  Net -1300.11 ml     Physical Exam: General:  Sitting on edge of bed. No resp difficulty HEENT: normal Neck: supple. no JVD. Carotids 2+ bilat; no bruits. No lymphadenopathy or thryomegaly appreciated. Cor: PMI nondisplaced. Regular rate & rhythm. No rubs, gallops or murmurs. Lungs: clear Abdomen: obese soft, nontender, nondistended. No hepatosplenomegaly. No bruits or masses. Good bowel sounds. Extremities: no cyanosis, clubbing, rash, tr edema  Skin peeling Neuro: alert & orientedx3, cranial nerves grossly intact. moves all 4 extremities w/o difficulty. Affect pleasant   Telemetry: NSR  50-60s Personally reviewed   Labs: Basic Metabolic Panel: Recent Labs  Lab 05/02/19 0552 05/03/19 0343 05/04/19 0514 05/05/19 0602 05/06/19 0302  NA 140 140 139 139 139  K 3.6 3.6 4.0 3.7 4.0  CL 92* 90* 86* 89* 83*  CO2 37* 36* 39* 40* 39*  GLUCOSE 197* 180* 249* 202* 176*  BUN 63* 62* 61* 67* 78*  CREATININE 2.01* 1.84* 1.85* 1.82* 2.37*  CALCIUM 9.2 9.5 9.4 9.4 9.6  MG 2.4  --   --   --   --     Liver Function Tests: Recent Labs  Lab 04/30/19 2337  AST 16  ALT 16  ALKPHOS 80  BILITOT  0.9  PROT 7.1  ALBUMIN 3.4*   No results for input(s): LIPASE, AMYLASE in the last 168 hours. No results for input(s): AMMONIA in the last 168 hours.  CBC: Recent Labs  Lab 05/01/19 0123 05/02/19 0552 05/03/19 0343 05/06/19 0302  WBC 12.4* 9.2 9.2 8.3  NEUTROABS 11.0* 7.6 7.4  --   HGB 13.1 12.6* 12.3* 12.8*  HCT 41.1 39.1 38.3* 40.0  MCV 91.5 90.9 91.4 90.9  PLT 195 182 186 210    Cardiac Enzymes: Recent Labs  Lab 05/01/19 0123  TROPONINI <0.03    BNP: BNP (last 3 results) Recent Labs    10/27/18 1013 03/02/19 1157 05/01/19 0123  BNP 38.1 44.4 87.6    ProBNP (last 3 results) No results for input(s): PROBNP in the last 8760 hours.    Other results:  Imaging: No results found.   Medications:     Scheduled Medications: . aspirin EC  81 mg Oral Daily  . atorvastatin  20 mg Oral q1800  . chlorhexidine  15 mL Mouth Rinse BID  . docusate sodium  200 mg Oral QHS  . doxycycline  100 mg Oral Q12H  . enoxaparin (LOVENOX) injection  60 mg Subcutaneous Q24H  . escitalopram  20 mg Oral Daily  . fesoterodine  8 mg Oral Daily  . gabapentin  400 mg Oral TID  .  insulin aspart  0-15 Units Subcutaneous TID WC  . insulin aspart  0-5 Units Subcutaneous QHS  . insulin aspart  12 Units Subcutaneous TID WC  . insulin glargine  50 Units Subcutaneous QHS  . ipratropium  2 puff Inhalation BID  . levothyroxine  175 mcg Oral Daily  . mouth rinse  15 mL Mouth Rinse q12n4p  . multivitamin with minerals  1 tablet Oral QHS  . pramipexole  0.5 mg Oral q morning - 10a  . pramipexole  1 mg Oral QPM  . Ensure Max Protein  11 oz Oral QHS  . sodium chloride flush  3 mL Intravenous Q12H  . tamsulosin  0.4 mg Oral QHS    Infusions: . 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  Admitted with concern for sepsis due to UTI/BLE cellulitis and volume overload.   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 Reviewed by Dr. Mauricia Area determined to be negative. Myeloma panel with no M-spike, normal IFE. - Echo 5/4 EF 60-65%, normal RV - Volume status much improved. Weights are inaccurate. - Now with AKI. Will hold diuretics today. Restart home torsemide/metolazone in 2-3 days -Continue spiro 25 daily - Intolerant to [place] with UTI and rash. - ContinueUNNA boots.   2. ID -> UTI - Blood cx NGTD. UA+ mod leukocytes. Urine cx with multiple species, suggested recollection - Continue Zosyn per primary team. He is also on PO doxy for ?cellulitis.  - Afebrile. WBC ok  3.Chronic Hypoxic Respiratory Failure/COPD - On 3 L O2 qHS at home. Currently on 4 L. Wears BiPAP qHS.   4. Hypokalemia - K 4.0   5. Acute on CKD Stage III - Creatinine 1.82 -> 2.37 due to overdiuresis. Hold diuretics for at least 2 days.  -Monitor closely with diuresis.   6. OSA -ContinueBiPAP qHS - Follows with Dr. Halford Chessman.  7. CAD - Non-obstructive. LHC 08/2015: LM 40, LAD distal 40, LCx ostial 50, RCA proximal 40 -No s/s ischemia. - On ASA/statin  8. DM - Uncontrolled. A1C is 10.0 - Intolerant to [place] with UTI and rash.  9. +D-dimer - BLE dopplers negative for DVT  10. Deconditioning - Pending CIR placement    Length of Stay: 5   Glori Bickers MD 05/06/2019, 2:22 PM  Advanced Heart Failure Team Pager [phone] (M-F; [place])  Please contact [facility] for night-coverage after hours (4p -7a  ) and weekends on [email or web address]

## 2019-05-06 NOTE — Progress Notes (Signed)
Inpatient Rehabilitation Admissions Coordinator  We will follow up next week with pt's progress next week functionally and medically. I have not begun insurance authorization at this time as he is not medically ready. Rehab venue pending approval.  Danne Baxter, RN, MSN Rehab Admissions Coordinator [phone] 05/06/2019 1:14 PM

## 2019-05-06 NOTE — Progress Notes (Signed)
Inpatient Diabetes Program Recommendations  AACE/ADA: New Consensus Statement on Inpatient Glycemic Control (2015)  Target Ranges:  Prepandial:   less than 140 mg/dL      Peak postprandial:   less than 180 mg/dL (1-2 hours)      Critically ill patients:  140 - 180 mg/dL   Results for PHILMORE, LEPORE (MRN [id number]) as of 05/06/2019 12:24  Ref. Range 05/05/2019 06:29 05/05/2019 07:56 05/05/2019 11:29 05/05/2019 16:06 05/05/2019 21:52  Glucose-Capillary Latest Ref Range: 70 - 99 mg/dL 238 (H)  13 units NOVOLOG  171 (H) 227 (H)  13 units NOVOLOG  163 (H)  11 units NOVOLOG  200 (H)    45 units LANTUS   Results for BETZALEL, UMBARGER (MRN [id number]) as of 05/06/2019 12:24  Ref. Range 05/06/2019 06:14 05/06/2019 11:44  Glucose-Capillary Latest Ref Range: 70 - 99 mg/dL 175 (H)  11 units NOVOLOG  268 (H)    Home DM Meds: Lantus 60 units QHS                             Novolog 18 units TID   Current Orders: Lantus 45 units QHS       Novolog Moderate Correction Scale/ SSI (0-15 units) TID AC + HS      Novolog 8 units TID with meals      Note Lantus increased again last PM by 5 units.  Still elevated this AM and at 12pm.    MD- Please consider the following in-hospital insulin adjustments:  1. Increase Lantus to 50 units QHS  2. Increase Novolog Meal Coverage to: Novolog 12 units TID with meals  (Please add the following Hold Parameters: Hold if pt eats <50% of meal, Hold if pt NPO)     --Will follow patient during hospitalization--  Wyn Quaker RN, MSN, CDE Diabetes Coordinator Inpatient Glycemic Control Team Team Pager: [phone] (8a-5p)

## 2019-05-07 LAB — BASIC METABOLIC PANEL
Anion gap: 14 (ref 5–15)
BUN: 86 mg/dL — ABNORMAL HIGH (ref 8–23)
CO2: 37 mmol/L — ABNORMAL HIGH (ref 22–32)
Calcium: 9.5 mg/dL (ref 8.9–10.3)
Chloride: 85 mmol/L — ABNORMAL LOW (ref 98–111)
Creatinine, Ser: 2.29 mg/dL — ABNORMAL HIGH (ref 0.61–1.24)
GFR calc Af Amer: 31 mL/min — ABNORMAL LOW (ref >60)
GFR calc non Af Amer: 27 mL/min — ABNORMAL LOW (ref >60)
Glucose, Bld: 157 mg/dL — ABNORMAL HIGH (ref 70–99)
Potassium: 3.9 mmol/L (ref 3.5–5.1)
Sodium: 136 mmol/L (ref 135–145)

## 2019-05-07 LAB — CBC
HCT: 40.3 % (ref 39.0–52.0)
Hemoglobin: 12.9 g/dL — ABNORMAL LOW (ref 13.0–17.0)
MCH: 28.8 pg (ref 26.0–34.0)
MCHC: 32 g/dL (ref 30.0–36.0)
MCV: 90 fL (ref 80.0–100.0)
Platelets: 220 10*3/uL (ref 150–400)
RBC: 4.48 MIL/uL (ref 4.22–5.81)
RDW: 14.6 % (ref 11.5–15.5)
WBC: 9 10*3/uL (ref 4.0–10.5)
nRBC: 0 % (ref 0.0–0.2)

## 2019-05-07 LAB — GLUCOSE, CAPILLARY
Glucose-Capillary: 134 mg/dL — ABNORMAL HIGH (ref 70–99)
Glucose-Capillary: 221 mg/dL — ABNORMAL HIGH (ref 70–99)
Glucose-Capillary: 256 mg/dL — ABNORMAL HIGH (ref 70–99)
Glucose-Capillary: 164 mg/dL — ABNORMAL HIGH (ref 70–99)

## 2019-05-07 MED ORDER — GERHARDT'S BUTT CREAM
TOPICAL_CREAM | CUTANEOUS | Status: DC | PRN
  Filled 2019-05-07: qty 1

## 2019-05-07 NOTE — Progress Notes (Signed)
RT Note:  Patient now has personal machine in room that he is using.  Put sterile water in machine for tonight for him.  Patient stated that he can self manage his machine for tonight.  Will continue to monitor.

## 2019-05-07 NOTE — Progress Notes (Signed)
[facility]                                                                              Patient Demographics  Travis [name], is a 73 y.o. male, DOB - [birth year], FFM:[id number]  Admit date - 04/30/2019   Admitting Physician Ivor Costa, MD  Outpatient Primary MD for the patient is [name], MD  Outpatient specialists:   LOS - 6  days   Medical records reviewed and are as summarized below:    Chief Complaint  Patient presents with   Shortness of Breath       Brief summary   Patient is a 73 year old male with hypertension, hyperlipidemia, diabetes, COPD on 2 L nasal cannula at home, hypothyroidism, CHF, OSA on CPAP, dementia, CKD stage III presented with bilateral leg edema, pain, anasarca and shortness of breath.  His legs were also erythematous. In ED, patient was found to have BNP 87.6, negative troponins, stable renal function, chest x-ray showed low volume and vascular congestion without obvious infiltration. Rapid COVID test negative  Assessment & Plan    Principal Problem:   Acute on chronic diastolic CHF (congestive heart failure) ([place]) -Presented with anasarca, shortness of breath, bilateral lower extremity edema.  Chest x-ray showed pulmonary vascular congestion -Lasix drip, metolazone discontinued, creatinine improving -BP soft, continue to hold metoprolol and spironolactone -Diuresing well.  Negative balance of 11.4lbs  - weight 380lbs on admission->   Active Problems: Lower extremity cellulitis -Blood cultures negative, MRSA screen negative, urine culture with multiple species, was on antibiotics prior to admission for UTI -COVID test negative -Continue doxycycline, Unna boots  Type II IDDM, uncontrolled with CKD -CBGs stable, continue Lantus 15 units, meal coverage, sliding scale insulin   Acute on CKD stage III -Creatinine trended up to 2.3, likely due to aggressive diuresis  -Creatinine improving, 2.2 today  COPD  -Stable,  no wheezing   Depression - cont lexapro, currently stable, no SI, HI   Restless leg syndrome - cont pramipexole   Morbid obesity  BMI 37, recommended diet and weight control  Sacral stage II pressure ulcer, POA Continue wound care  Failure to thrive, generalized debility -Reported falls at home, uses wheelchair/walker at home PT recommended skilled nursing facility however patient and wife declined -CIR evaluating the patient, currently not medically ready  Diarrhea Stop MiraLAX scheduled, placed on Imodium x1  Code Status: Full code DVT Prophylaxis:  Family Communication: Discussed in detail with the patient, all imaging results, lab results explained to the patient.  Discussed with patient's wife on phone yesterday   Disposition Plan: Very weak, difficult to manage at home.  Lasix drip discontinued.  Discussed with CIR, plan for transfer to inpatient rehab likely on Monday   Time Spent in minutes 25 minutes  Procedures:  None  Consultants:   Cardiology CIR  Antimicrobials:   Anti-infectives (From admission, onward)   Start     Dose/Rate Route Frequency Ordered Stop   05/02/19 0500  vancomycin (VANCOCIN) 1,500 mg in sodium chloride 0.9 % 500 mL IVPB  Status:  Discontinued     1,500 mg 250 mL/hr over 120 Minutes Intravenous Every  24 hours 05/01/19 0358 05/01/19 1500   05/01/19 1515  doxycycline (VIBRA-TABS) tablet 100 mg     100 mg Oral Every 12 hours 05/01/19 1500     05/01/19 1200  piperacillin-tazobactam (ZOSYN) IVPB 3.375 g  Status:  Discontinued     3.375 g 12.5 mL/hr over 240 Minutes Intravenous Every 8 hours 05/01/19 0358 05/06/19 0816   05/01/19 0400  vancomycin (VANCOCIN) IVPB 1000 mg/200 mL premix     1,000 mg 200 mL/hr over 60 Minutes Intravenous  Once 05/01/19 0355 05/01/19 0544   05/01/19 0245  vancomycin (VANCOCIN) IVPB 1000 mg/200 mL premix  Status:  Discontinued     1,000 mg 200 mL/hr over 60 Minutes Intravenous  Once 05/01/19 0235 05/01/19  0437   05/01/19 0245  piperacillin-tazobactam (ZOSYN) IVPB 3.375 g     3.375 g 100 mL/hr over 30 Minutes Intravenous  Once 05/01/19 0235 05/01/19 0413         Medications  Scheduled Meds:  aspirin EC  81 mg Oral Daily   atorvastatin  20 mg Oral q1800   chlorhexidine  15 mL Mouth Rinse BID   docusate sodium  200 mg Oral QHS   doxycycline  100 mg Oral Q12H   enoxaparin (LOVENOX) injection  60 mg Subcutaneous Q24H   escitalopram  20 mg Oral Daily   fesoterodine  8 mg Oral Daily   gabapentin  400 mg Oral TID   insulin aspart  0-15 Units Subcutaneous TID WC   insulin aspart  0-5 Units Subcutaneous QHS   insulin aspart  12 Units Subcutaneous TID WC   insulin glargine  50 Units Subcutaneous QHS   ipratropium  2 puff Inhalation BID   levothyroxine  175 mcg Oral Daily   mouth rinse  15 mL Mouth Rinse q12n4p   multivitamin with minerals  1 tablet Oral QHS   pramipexole  0.5 mg Oral q morning - 10a   pramipexole  1 mg Oral QPM   Ensure Max Protein  11 oz Oral QHS   sodium chloride flush  3 mL Intravenous Q12H   tamsulosin  0.4 mg Oral QHS   Continuous Infusions:  sodium chloride 250 mL (05/05/19 1528)   PRN Meds:.sodium chloride, acetaminophen, albuterol, alum & mag hydroxide-simeth, dextromethorphan-guaiFENesin, Gerhardt's butt cream, hydrALAZINE, loperamide, ondansetron (ZOFRAN) IV, sodium chloride flush, traZODone      Subjective:   Travis [name] was seen and examined today.  Feels better, swelling has improved, shortness of breath improving, still very weak and deconditioned.  Diarrhea improved.  Patient denies dizziness, abdominal pain, N/V/D/C, new weakness, numbess, tingling. No acute events overnight.    Objective:   Vitals:   05/06/19 2059 05/07/19 0513 05/07/19 0918 05/07/19 1139  BP:  127/63  (!) 97/45  Pulse:  70  62  Resp:  18  20  Temp:  98.2 F (36.8 C)  98.2 F (36.8 C)  TempSrc:  Oral  Oral  SpO2: 93% 92% 93% 94%  Weight:   126.7 kg    Height:        Intake/Output Summary (Last 24 hours) at 05/07/2019 1421 Last data filed at 05/07/2019 1344 Gross per 24 hour  Intake 1057 ml  Output 1650 ml  Net -593 ml     Wt Readings from Last 3 Encounters:  05/07/19 126.7 kg  04/15/19 129.7 kg  03/04/19 126.1 kg    Physical Exam  General: Alert and oriented x 3, NAD  Eyes:   HEENT:  Atraumatic, normocephalic  Cardiovascular:  S1 S2 clear, no murmurs, RRR.   Respiratory: CTAB, no wheezing, rales or rhonchi  Gastrointestinal: Soft, nontender, nondistended, NBS  Ext: Unna boots, legs wrapped  Neuro: no new deficits  Musculoskeletal: No cyanosis, clubbing  Skin: No rashes  Psych: Normal affect and demeanor, alert and oriented x3    Data Reviewed:  I have personally reviewed following labs and imaging studies  Micro Results Recent Results (from the past 240 hour(s))  Urine Culture     Status: Abnormal   Collection Time: 05/01/19  2:58 AM  Result Value Ref Range Status   Specimen Description URINE, RANDOM  Final   Special Requests   Final    NONE Performed at [facility], [address] [place], [place] [address]    Culture MULTIPLE SPECIES PRESENT, SUGGEST RECOLLECTION (A)  Final   Report Status 05/02/2019 FINAL  Final  SARS Coronavirus 2 [facility] order, Performed in [place] hospital lab)     Status: None   Collection Time: 05/01/19  3:24 AM  Result Value Ref Range Status   SARS Coronavirus 2 NEGATIVE NEGATIVE Final    Comment: (NOTE) If result is NEGATIVE SARS-CoV-2 target nucleic acids are NOT DETECTED. The SARS-CoV-2 RNA is generally detectable in upper and lower  respiratory specimens during the acute phase of infection. The lowest  concentration of SARS-CoV-2 viral copies this assay can detect is 250  copies / mL. A negative result does not preclude SARS-CoV-2 infection  and should not be used as the sole basis for treatment or other  patient management decisions.  A  negative result may occur with  improper specimen collection / handling, submission of specimen other  than nasopharyngeal swab, presence of viral mutation(s) within the  areas targeted by this assay, and inadequate number of viral copies  (<250 copies / mL). A negative result must be combined with clinical  observations, patient history, and epidemiological information. If result is POSITIVE SARS-CoV-2 target nucleic acids are DETECTED. The SARS-CoV-2 RNA is generally detectable in upper and lower  respiratory specimens dur ing the acute phase of infection.  Positive  results are indicative of active infection with SARS-CoV-2.  Clinical  correlation with patient history and other diagnostic information is  necessary to determine patient infection status.  Positive results do  not rule out bacterial infection or co-infection with other viruses. If result is PRESUMPTIVE POSTIVE SARS-CoV-2 nucleic acids MAY BE PRESENT.   A presumptive positive result was obtained on the submitted specimen  and confirmed on repeat testing.  While 2019 novel coronavirus  (SARS-CoV-2) nucleic acids may be present in the submitted sample  additional confirmatory testing may be necessary for epidemiological  and / or clinical management purposes  to differentiate between  SARS-CoV-2 and other Sarbecovirus currently known to infect humans.  If clinically indicated additional testing with an alternate test  methodology [phone]) is advised. The SARS-CoV-2 RNA is generally  detectable in upper and lower respiratory sp ecimens during the acute  phase of infection. The expected result is Negative. Fact Sheet for Patients:  StrictlyIdeas.no Fact Sheet for Healthcare Providers: BankingDealers.co.za This test is not yet approved or cleared by the [place] FDA and has been authorized for detection and/or diagnosis of SARS-CoV-2 by FDA under an Emergency Use  Authorization (EUA).  This EUA will remain in effect (meaning this test can be used) for the duration of the COVID-19 declaration under Section 564(b)(1) of the Act, 21 U.S.C. section 360bbb-3(b)(1), unless the authorization is terminated  or revoked sooner. Performed at [facility], [place] [address], [place], [place] [address]   Culture, blood (Routine X 2) w Reflex to ID Panel     Status: None   Collection Time: 05/01/19  3:52 AM  Result Value Ref Range Status   Specimen Description BLOOD RIGHT ARM  Final   Special Requests   Final    BOTTLES DRAWN AEROBIC AND ANAEROBIC Blood Culture results may not be optimal due to an excessive volume of blood received in culture bottles   Culture   Final    NO GROWTH 5 DAYS Performed at [facility], [place] [address] [place], [place] [address]    Report Status 05/06/2019 FINAL  Final  Culture, blood (Routine X 2) w Reflex to ID Panel     Status: None   Collection Time: 05/01/19  3:52 AM  Result Value Ref Range Status   Specimen Description BLOOD LEFT ARM  Final   Special Requests   Final    BOTTLES DRAWN AEROBIC ONLY Blood Culture adequate volume   Culture   Final    NO GROWTH 5 DAYS Performed at [facility], [place] [address], [place], [place] [address]    Report Status 05/06/2019 FINAL  Final  MRSA PCR Screening     Status: None   Collection Time: 05/01/19  3:23 PM  Result Value Ref Range Status   MRSA by PCR NEGATIVE NEGATIVE Final    Comment:        The GeneXpert MRSA Assay (FDA approved for NASAL specimens only), is one component of a comprehensive MRSA colonization surveillance program. It is not intended to diagnose MRSA infection nor to guide or monitor treatment for MRSA infections. Performed at [facility], [place] [address] [place],  [address]     Radiology Reports Dg Chest Portable 1 View  Result Date: 05/01/2019 CLINICAL DATA:  Shortness of breath EXAM: PORTABLE CHEST 1 VIEW COMPARISON:   11/29/2018 FINDINGS: Very low lung volumes. Bibasilar atelectasis. Vascular congestion. No visible effusions or acute bony abnormality. IMPRESSION: Very low lung volumes with bibasilar atelectasis and vascular congestion. Electronically Signed   By: Rolm Baptise M.D.   On: 05/01/2019 00:50   Vas [place] Lower Extremity Venous (dvt)  Result Date: 05/02/2019  Lower Venous Study Indications: Cellulitis.  Limitations: Poor ultrasound/tissue interface. Performing Technologist: Oliver Hum RVT  Examination Guidelines: A complete evaluation includes B-mode imaging, spectral Doppler, color Doppler, and power Doppler as needed of all accessible portions of each vessel. Bilateral testing is considered an integral part of a complete examination. Limited examinations for reoccurring indications may be performed as noted.  +---------+---------------+---------+-----------+----------+-------+  RIGHT     Compressibility Phasicity Spontaneity Properties Summary  +---------+---------------+---------+-----------+----------+-------+  CFV       Full            Yes       Yes                             +---------+---------------+---------+-----------+----------+-------+  SFJ       Full                                                      +---------+---------------+---------+-----------+----------+-------+  FV Prox   Full                                                      +---------+---------------+---------+-----------+----------+-------+  FV Mid    Full                                                      +---------+---------------+---------+-----------+----------+-------+  FV Distal Full            Yes       Yes                             +---------+---------------+---------+-----------+----------+-------+  PFV       Full                                                      +---------+---------------+---------+-----------+----------+-------+  POP       Full            Yes       Yes                              +---------+---------------+---------+-----------+----------+-------+  PTV       Full                                                      +---------+---------------+---------+-----------+----------+-------+  PERO      Full                                                      +---------+---------------+---------+-----------+----------+-------+   +---------+---------------+---------+-----------+----------+--------------+  LEFT      Compressibility Phasicity Spontaneity Properties Summary         +---------+---------------+---------+-----------+----------+--------------+  CFV       Full            Yes       Yes                                    +---------+---------------+---------+-----------+----------+--------------+  SFJ       Full                                                             +---------+---------------+---------+-----------+----------+--------------+  FV Prox   Full                                                             +---------+---------------+---------+-----------+----------+--------------+  FV Mid    Full                                                             +---------+---------------+---------+-----------+----------+--------------+  FV Distal Full            Yes       Yes                                    +---------+---------------+---------+-----------+----------+--------------+  PFV       Full                                                             +---------+---------------+---------+-----------+----------+--------------+  POP       Full            Yes       Yes                                    +---------+---------------+---------+-----------+----------+--------------+  PTV       Full                                                             +---------+---------------+---------+-----------+----------+--------------+  PERO                                                       Not visualized  +---------+---------------+---------+-----------+----------+--------------+      Summary: Right: There is no evidence of deep vein thrombosis in the lower extremity. However, portions of this examination were limited- see technologist comments above. No cystic structure found in the popliteal fossa. Left: There is no evidence of deep vein thrombosis in the lower extremity. However, portions of this examination were limited- see technologist comments above. No cystic structure found in the popliteal fossa.  *See table(s) above for measurements and observations. Electronically signed by Harold Barban MD on 05/02/2019 at 11:09:50 AM.    Final     Lab Data:  CBC: Recent Labs  Lab 05/01/19 0123 05/02/19 0552 05/03/19 0343 05/06/19 0302 05/07/19 0254  WBC 12.4* 9.2 9.2 8.3 9.0  NEUTROABS 11.0* 7.6 7.4  --   --   HGB 13.1 12.6* 12.3* 12.8* 12.9*  HCT 41.1 39.1 38.3* 40.0 40.3  MCV 91.5 90.9 91.4 90.9 90.0  PLT 195 182 186 210 341   Basic Metabolic Panel: Recent Labs  Lab 05/02/19 0552 05/03/19 0343 05/04/19 0514 05/05/19 0602 05/06/19 0302 05/07/19 0254  NA 140 140 139 139 139 136  K 3.6 3.6 4.0 3.7 4.0 3.9  CL 92* 90* 86* 89* 83* 85*  CO2 37* 36* 39* 40* 39* 37*  GLUCOSE 197* 180* 249* 202* 176* 157*  BUN 63* 62* 61* 67* 78* 86*  CREATININE 2.01* 1.84* 1.85* 1.82* 2.37* 2.29*  CALCIUM 9.2 9.5 9.4 9.4 9.6 9.5  MG 2.4  --   --   --   --   --    GFR: Estimated Creatinine Clearance: 39.5 mL/min (A) (by C-G formula based on  SCr of 2.29 mg/dL (H)). Liver Function Tests: Recent Labs  Lab 04/30/19 2337  AST 16  ALT 16  ALKPHOS 80  BILITOT 0.9  PROT 7.1  ALBUMIN 3.4*   No results for input(s): LIPASE, AMYLASE in the last 168 hours. No results for input(s): AMMONIA in the last 168 hours. Coagulation Profile: No results for input(s): INR, PROTIME in the last 168 hours. Cardiac Enzymes: Recent Labs  Lab 05/01/19 0123  TROPONINI <0.03   BNP (last 3 results) No results for input(s): PROBNP in the last 8760 hours. HbA1C: No results for input(s): HGBA1C  in the last 72 hours. CBG: Recent Labs  Lab 05/06/19 0614 05/06/19 1144 05/06/19 1607 05/06/19 2124 05/07/19 0618  GLUCAP 175* 268* 186* 140* 134*   Lipid Profile: No results for input(s): CHOL, HDL, LDLCALC, TRIG, CHOLHDL, LDLDIRECT in the last 72 hours. Thyroid Function Tests: No results for input(s): TSH, T4TOTAL, FREET4, T3FREE, THYROIDAB in the last 72 hours. Anemia Panel: No results for input(s): VITAMINB12, FOLATE, FERRITIN, TIBC, IRON, RETICCTPCT in the last 72 hours. Urine analysis:    Component Value Date/Time   COLORURINE STRAW (A) 05/01/2019 0258   APPEARANCEUR CLEAR 05/01/2019 0258   LABSPEC 1.009 05/01/2019 0258   PHURINE 5.0 05/01/2019 0258   GLUCOSEU 150 (A) 05/01/2019 0258   HGBUR NEGATIVE 05/01/2019 0258   BILIRUBINUR NEGATIVE 05/01/2019 0258   KETONESUR NEGATIVE 05/01/2019 0258   PROTEINUR NEGATIVE 05/01/2019 0258   UROBILINOGEN 0.2 08/22/2015 1820   NITRITE NEGATIVE 05/01/2019 0258   LEUKOCYTESUR MODERATE (A) 05/01/2019 0258     Daliyah Sramek M.D. [facility] 05/07/2019, 2:21 PM  Pager: [phone] Between 7am to 7pm - call Pager - [phone]  After 7pm go to [email or web address] - password [email or web address]  Call night coverage person covering after 7pm

## 2019-05-08 LAB — CBC
HCT: 37.2 % — ABNORMAL LOW (ref 39.0–52.0)
Hemoglobin: 11.9 g/dL — ABNORMAL LOW (ref 13.0–17.0)
MCH: 28.7 pg (ref 26.0–34.0)
MCHC: 32 g/dL (ref 30.0–36.0)
MCV: 89.9 fL (ref 80.0–100.0)
Platelets: 223 10*3/uL (ref 150–400)
RBC: 4.14 MIL/uL — ABNORMAL LOW (ref 4.22–5.81)
RDW: 14.6 % (ref 11.5–15.5)
WBC: 7.9 10*3/uL (ref 4.0–10.5)
nRBC: 0 % (ref 0.0–0.2)

## 2019-05-08 LAB — BASIC METABOLIC PANEL
Anion gap: 13 (ref 5–15)
BUN: 90 mg/dL — ABNORMAL HIGH (ref 8–23)
CO2: 37 mmol/L — ABNORMAL HIGH (ref 22–32)
Calcium: 9.4 mg/dL (ref 8.9–10.3)
Chloride: 87 mmol/L — ABNORMAL LOW (ref 98–111)
Creatinine, Ser: 1.99 mg/dL — ABNORMAL HIGH (ref 0.61–1.24)
GFR calc Af Amer: 37 mL/min — ABNORMAL LOW (ref >60)
GFR calc non Af Amer: 32 mL/min — ABNORMAL LOW (ref >60)
Glucose, Bld: 123 mg/dL — ABNORMAL HIGH (ref 70–99)
Potassium: 3.3 mmol/L — ABNORMAL LOW (ref 3.5–5.1)
Sodium: 137 mmol/L (ref 135–145)

## 2019-05-08 LAB — GLUCOSE, CAPILLARY
Glucose-Capillary: 141 mg/dL — ABNORMAL HIGH (ref 70–99)
Glucose-Capillary: 149 mg/dL — ABNORMAL HIGH (ref 70–99)
Glucose-Capillary: 133 mg/dL — ABNORMAL HIGH (ref 70–99)
Glucose-Capillary: 265 mg/dL — ABNORMAL HIGH (ref 70–99)

## 2019-05-08 MED ORDER — METOPROLOL TARTRATE 12.5 MG HALF TABLET
12.5000 mg | ORAL_TABLET | Freq: Two times a day (BID) | ORAL | Status: DC
  Administered 2019-05-08 – 2019-05-09 (×3): 12.5 mg via ORAL
  Filled 2019-05-08 (×3): qty 1

## 2019-05-08 MED ORDER — POTASSIUM CHLORIDE CRYS ER 20 MEQ PO TBCR
40.0000 meq | EXTENDED_RELEASE_TABLET | Freq: Once | ORAL | Status: AC
  Administered 2019-05-08: 40 meq via ORAL
  Filled 2019-05-08: qty 2

## 2019-05-08 MED ORDER — SPIRONOLACTONE 12.5 MG HALF TABLET
12.5000 mg | ORAL_TABLET | Freq: Every day | ORAL | Status: DC
  Administered 2019-05-08 – 2019-05-11 (×4): 12.5 mg via ORAL
  Filled 2019-05-08 (×4): qty 1

## 2019-05-08 NOTE — Progress Notes (Signed)
Xeroform gauze applied in scrotum for comfort. scrotum circumferential area red and tender. Patient is currently sitting in chair watching television.  Unna boots were changed today by orthopedic tech.

## 2019-05-08 NOTE — Progress Notes (Signed)
Orthopedic Tech Progress Note Patient Details:  TURHAN CHILL 10-25-1946 [id number] Washed patients legs and applied unna boots. Ortho Devices Type of Ortho Device: Haematologist Ortho Device/Splint Location: bilateral Ortho Device/Splint Interventions: Adjustment, Application, Ordered   Post Interventions Patient Tolerated: Well Instructions Provided: Care of device, Adjustment of device   Janit Pagan 05/08/2019, 1:33 PM

## 2019-05-08 NOTE — Progress Notes (Signed)
RT Note:  Put sterile water in patient's machine.  Placed mask onto bed for patient.  Patient sometimes self manages machine, but did let patient know if he has any problems with getting machine on to have RN call me.  Will continue to monitor.

## 2019-05-08 NOTE — Progress Notes (Signed)
[facility]                                                                              Patient Demographics  Travis [name], is a 73 y.o. male, DOB - [birth year], ASN:[id number]  Admit date - 04/30/2019   Admitting Physician Ivor Costa, MD  Outpatient Primary MD for the patient is Travis Arabian, MD  Outpatient specialists:   LOS - 7  days   Medical records reviewed and are as summarized below:    Chief Complaint  Patient presents with   Shortness of Breath       Brief summary   Patient is a 73 year old male with hypertension, hyperlipidemia, diabetes, COPD on 2 L nasal cannula at home, hypothyroidism, CHF, OSA on CPAP, dementia, CKD stage III presented with bilateral leg edema, pain, anasarca and shortness of breath.  His legs were also erythematous. In ED, patient was found to have BNP 87.6, negative troponins, stable renal function, chest x-ray showed low volume and vascular congestion without obvious infiltration. Rapid COVID test negative  Assessment & Plan    Principal Problem:   Acute on chronic diastolic CHF (congestive heart failure) ([place]) -Presented with anasarca, shortness of breath, bilateral lower extremity edema.  Chest x-ray showed pulmonary vascular congestion -Creatinine was trending up hence Lasix drip, metolazone was discontinued.  -Anasarca significantly improved, negative balance of 11 L. -BP better today, will resume metoprolol, Aldactone - weight 380lbs on admission-> 279 today  Active Problems: Lower extremity cellulitis -Blood cultures negative, MRSA screen negative, urine culture with multiple species, was on antibiotics prior to admission for UTI -COVID test negative -Continue doxycycline, Unna boots  Type II IDDM, uncontrolled with CKD -CBGs controlled today, continue Lantus, meal coverage, sliding scale insulin   Acute on CKD stage III -Creatinine trended up to 2.3, likely due to aggressive diuresis  -Creatinine  improving, at baseline 1.9 today  COPD  -Stable, no wheezing   Depression - cont lexapro, currently stable, no SI, HI   Restless leg syndrome - cont pramipexole   Morbid obesity  BMI 37, recommended diet and weight control  Sacral stage II pressure ulcer, POA Continue wound care  Failure to thrive, generalized debility -Reported falls at home, uses wheelchair/walker at home PT recommended skilled nursing facility however patient and wife declined -CIR evaluating patient  Diarrhea Stop MiraLAX scheduled, placed on Imodium x1  Code Status: Full code DVT Prophylaxis:  Family Communication: Discussed in detail with the patient, all imaging results, lab results explained to the patient.  Discussed with patient's daughter-in-law at the bedside and wife on the phone   Disposition Plan: Weakness improving however still one-person assist on ambulating or getting up.  Not safe to be discharged home, plan for CIR tomorrow or when approved    Time Spent in minutes 25 minutes  Procedures:  None  Consultants:   Cardiology CIR  Antimicrobials:   Anti-infectives (From admission, onward)   Start     Dose/Rate Route Frequency Ordered Stop   05/02/19 0500  vancomycin (VANCOCIN) 1,500 mg in sodium chloride 0.9 % 500 mL IVPB  Status:  Discontinued     1,500  mg 250 mL/hr over 120 Minutes Intravenous Every 24 hours 05/01/19 0358 05/01/19 1500   05/01/19 1515  doxycycline (VIBRA-TABS) tablet 100 mg     100 mg Oral Every 12 hours 05/01/19 1500     05/01/19 1200  piperacillin-tazobactam (ZOSYN) IVPB 3.375 g  Status:  Discontinued     3.375 g 12.5 mL/hr over 240 Minutes Intravenous Every 8 hours 05/01/19 0358 05/06/19 0816   05/01/19 0400  vancomycin (VANCOCIN) IVPB 1000 mg/200 mL premix     1,000 mg 200 mL/hr over 60 Minutes Intravenous  Once 05/01/19 0355 05/01/19 0544   05/01/19 0245  vancomycin (VANCOCIN) IVPB 1000 mg/200 mL premix  Status:  Discontinued     1,000 mg 200  mL/hr over 60 Minutes Intravenous  Once 05/01/19 0235 05/01/19 0437   05/01/19 0245  piperacillin-tazobactam (ZOSYN) IVPB 3.375 g     3.375 g 100 mL/hr over 30 Minutes Intravenous  Once 05/01/19 0235 05/01/19 0413         Medications  Scheduled Meds:  aspirin EC  81 mg Oral Daily   atorvastatin  20 mg Oral q1800   chlorhexidine  15 mL Mouth Rinse BID   docusate sodium  200 mg Oral QHS   doxycycline  100 mg Oral Q12H   enoxaparin (LOVENOX) injection  60 mg Subcutaneous Q24H   escitalopram  20 mg Oral Daily   fesoterodine  8 mg Oral Daily   gabapentin  400 mg Oral TID   insulin aspart  0-15 Units Subcutaneous TID WC   insulin aspart  0-5 Units Subcutaneous QHS   insulin aspart  12 Units Subcutaneous TID WC   insulin glargine  50 Units Subcutaneous QHS   levothyroxine  175 mcg Oral Daily   mouth rinse  15 mL Mouth Rinse q12n4p   multivitamin with minerals  1 tablet Oral QHS   pramipexole  0.5 mg Oral q morning - 10a   pramipexole  1 mg Oral QPM   Ensure Max Protein  11 oz Oral QHS   sodium chloride flush  3 mL Intravenous Q12H   tamsulosin  0.4 mg Oral QHS   Continuous Infusions:  sodium chloride 250 mL (05/05/19 1528)   PRN Meds:.sodium chloride, acetaminophen, albuterol, alum & mag hydroxide-simeth, dextromethorphan-guaiFENesin, Gerhardt's butt cream, hydrALAZINE, loperamide, ondansetron (ZOFRAN) IV, sodium chloride flush, traZODone      Subjective:   Kayd Criado was seen and examined today.  Overall feeling better, peripheral edema has improved, shortness of breath is improving.  Weak and needing 1 person assist on getting up.   Patient denies dizziness, abdominal pain, N/V/D/C, new weakness, numbess, tingling. No acute events overnight.    Objective:   Vitals:   05/08/19 0450 05/08/19 0727 05/08/19 0940 05/08/19 1137  BP: (!) 105/36  134/64 135/60  Pulse: (!) 55  64 63  Resp: (!) 22   18  Temp: 98.3 F (36.8 C)   98 F (36.7 C)    TempSrc:    Oral  SpO2: 94% 91% 94% 95%  Weight:      Height:        Intake/Output Summary (Last 24 hours) at 05/08/2019 1423 Last data filed at 05/08/2019 1238 Gross per 24 hour  Intake 922 ml  Output 600 ml  Net 322 ml     Wt Readings from Last 3 Encounters:  05/07/19 126.7 kg  04/15/19 129.7 kg  03/04/19 126.1 kg   Physical Exam  General: Alert and oriented x 3, NAD  Eyes:  HEENT:  Atraumatic, normocephalic  Cardiovascular: S1 S2 clear, no murmurs, RRR. 1+ pedal edema b/l  Respiratory: CTAB, no wheezing, rales or rhonchi  Gastrointestinal: Soft, nontender, nondistended, NBS  Ext: unna +  Neuro: no new deficits  Musculoskeletal: No cyanosis, clubbing  Skin: No rashes  Psych: Normal affect and demeanor, alert and oriented x3    Data Reviewed:  I have personally reviewed following labs and imaging studies  Micro Results Recent Results (from the past 240 hour(s))  Urine Culture     Status: Abnormal   Collection Time: 05/01/19  2:58 AM  Result Value Ref Range Status   Specimen Description URINE, RANDOM  Final   Special Requests   Final    NONE Performed at [facility], [address], [place], [place] [address]    Culture MULTIPLE SPECIES PRESENT, SUGGEST RECOLLECTION (A)  Final   Report Status 05/02/2019 FINAL  Final  SARS Coronavirus 2 [facility] order, Performed in [facility] hospital lab)     Status: None   Collection Time: 05/01/19  3:24 AM  Result Value Ref Range Status   SARS Coronavirus 2 NEGATIVE NEGATIVE Final    Comment: (NOTE) If result is NEGATIVE SARS-CoV-2 target nucleic acids are NOT DETECTED. The SARS-CoV-2 RNA is generally detectable in upper and lower  respiratory specimens during the acute phase of infection. The lowest  concentration of SARS-CoV-2 viral copies this assay can detect is 250  copies / mL. A negative result does not preclude SARS-CoV-2 infection  and should not be used as the sole basis for treatment or  other  patient management decisions.  A negative result may occur with  improper specimen collection / handling, submission of specimen other  than nasopharyngeal swab, presence of viral mutation(s) within the  areas targeted by this assay, and inadequate number of viral copies  (<250 copies / mL). A negative result must be combined with clinical  observations, patient history, and epidemiological information. If result is POSITIVE SARS-CoV-2 target nucleic acids are DETECTED. The SARS-CoV-2 RNA is generally detectable in upper and lower  respiratory specimens dur ing the acute phase of infection.  Positive  results are indicative of active infection with SARS-CoV-2.  Clinical  correlation with patient history and other diagnostic information is  necessary to determine patient infection status.  Positive results do  not rule out bacterial infection or co-infection with other viruses. If result is PRESUMPTIVE POSTIVE SARS-CoV-2 nucleic acids MAY BE PRESENT.   A presumptive positive result was obtained on the submitted specimen  and confirmed on repeat testing.  While 2019 novel coronavirus  (SARS-CoV-2) nucleic acids may be present in the submitted sample  additional confirmatory testing may be necessary for epidemiological  and / or clinical management purposes  to differentiate between  SARS-CoV-2 and other Sarbecovirus currently known to infect humans.  If clinically indicated additional testing with an alternate test  methodology [phone]) is advised. The SARS-CoV-2 RNA is generally  detectable in upper and lower respiratory sp ecimens during the acute  phase of infection. The expected result is Negative. Fact Sheet for Patients:  StrictlyIdeas.no Fact Sheet for Healthcare Providers: BankingDealers.co.za This test is not yet approved or cleared by the [place] FDA and has been authorized for detection and/or diagnosis of  SARS-CoV-2 by FDA under an Emergency Use Authorization (EUA).  This EUA will remain in effect (meaning this test can be used) for the duration of the COVID-19 declaration under Section 564(b)(1) of the Act, 21 U.S.C.  section 360bbb-3(b)(1), unless the authorization is terminated or revoked sooner. Performed at [facility], [place] [address], [place], [place] [address]   Culture, blood (Routine X 2) w Reflex to ID Panel     Status: None   Collection Time: 05/01/19  3:52 AM  Result Value Ref Range Status   Specimen Description BLOOD RIGHT ARM  Final   Special Requests   Final    BOTTLES DRAWN AEROBIC AND ANAEROBIC Blood Culture results may not be optimal due to an excessive volume of blood received in culture bottles   Culture   Final    NO GROWTH 5 DAYS Performed at [facility], [place] [address], [place], [place] [address]    Report Status 05/06/2019 FINAL  Final  Culture, blood (Routine X 2) w Reflex to ID Panel     Status: None   Collection Time: 05/01/19  3:52 AM  Result Value Ref Range Status   Specimen Description BLOOD LEFT ARM  Final   Special Requests   Final    BOTTLES DRAWN AEROBIC ONLY Blood Culture adequate volume   Culture   Final    NO GROWTH 5 DAYS Performed at [facility], [place] [address] [place], [place] [address]    Report Status 05/06/2019 FINAL  Final  MRSA PCR Screening     Status: None   Collection Time: 05/01/19  3:23 PM  Result Value Ref Range Status   MRSA by PCR NEGATIVE NEGATIVE Final    Comment:        The GeneXpert MRSA Assay (FDA approved for NASAL specimens only), is one component of a comprehensive MRSA colonization surveillance program. It is not intended to diagnose MRSA infection nor to guide or monitor treatment for MRSA infections. Performed at [facility], [place] [address], [place],  [address]     Radiology Reports Dg Chest Portable 1 View  Result Date: 05/01/2019 CLINICAL DATA:  Shortness of breath  EXAM: PORTABLE CHEST 1 VIEW COMPARISON:  11/29/2018 FINDINGS: Very low lung volumes. Bibasilar atelectasis. Vascular congestion. No visible effusions or acute bony abnormality. IMPRESSION: Very low lung volumes with bibasilar atelectasis and vascular congestion. Electronically Signed   By: Rolm Baptise M.D.   On: 05/01/2019 00:50   Vas [place] Lower Extremity Venous (dvt)  Result Date: 05/02/2019  Lower Venous Study Indications: Cellulitis.  Limitations: Poor ultrasound/tissue interface. Performing Technologist: Oliver Hum RVT  Examination Guidelines: A complete evaluation includes B-mode imaging, spectral Doppler, color Doppler, and power Doppler as needed of all accessible portions of each vessel. Bilateral testing is considered an integral part of a complete examination. Limited examinations for reoccurring indications may be performed as noted.  +---------+---------------+---------+-----------+----------+-------+  RIGHT     Compressibility Phasicity Spontaneity Properties Summary  +---------+---------------+---------+-----------+----------+-------+  CFV       Full            Yes       Yes                             +---------+---------------+---------+-----------+----------+-------+  SFJ       Full                                                      +---------+---------------+---------+-----------+----------+-------+  FV  Prox   Full                                                      +---------+---------------+---------+-----------+----------+-------+  FV Mid    Full                                                      +---------+---------------+---------+-----------+----------+-------+  FV Distal Full            Yes       Yes                             +---------+---------------+---------+-----------+----------+-------+  PFV       Full                                                      +---------+---------------+---------+-----------+----------+-------+  POP       Full            Yes       Yes                              +---------+---------------+---------+-----------+----------+-------+  PTV       Full                                                      +---------+---------------+---------+-----------+----------+-------+  PERO      Full                                                      +---------+---------------+---------+-----------+----------+-------+   +---------+---------------+---------+-----------+----------+--------------+  LEFT      Compressibility Phasicity Spontaneity Properties Summary         +---------+---------------+---------+-----------+----------+--------------+  CFV       Full            Yes       Yes                                    +---------+---------------+---------+-----------+----------+--------------+  SFJ       Full                                                             +---------+---------------+---------+-----------+----------+--------------+  FV Prox   Full                                                             +---------+---------------+---------+-----------+----------+--------------+  FV Mid    Full                                                             +---------+---------------+---------+-----------+----------+--------------+  FV Distal Full            Yes       Yes                                    +---------+---------------+---------+-----------+----------+--------------+  PFV       Full                                                             +---------+---------------+---------+-----------+----------+--------------+  POP       Full            Yes       Yes                                    +---------+---------------+---------+-----------+----------+--------------+  PTV       Full                                                             +---------+---------------+---------+-----------+----------+--------------+  PERO                                                       Not visualized   +---------+---------------+---------+-----------+----------+--------------+     Summary: Right: There is no evidence of deep vein thrombosis in the lower extremity. However, portions of this examination were limited- see technologist comments above. No cystic structure found in the popliteal fossa. Left: There is no evidence of deep vein thrombosis in the lower extremity. However, portions of this examination were limited- see technologist comments above. No cystic structure found in the popliteal fossa.  *See table(s) above for measurements and observations. Electronically signed by Harold Barban MD on 05/02/2019 at 11:09:50 AM.    Final     Lab Data:  CBC: Recent Labs  Lab 05/02/19 0552 05/03/19 0343 05/06/19 0302 05/07/19 0254 05/08/19 0513  WBC 9.2 9.2 8.3 9.0 7.9  NEUTROABS 7.6 7.4  --   --   --   HGB 12.6* 12.3* 12.8* 12.9* 11.9*  HCT 39.1 38.3* 40.0 40.3 37.2*  MCV 90.9 91.4 90.9 90.0 89.9  PLT 182 186 210 220 423   Basic Metabolic Panel: Recent Labs  Lab 05/02/19 0552  05/04/19 0514 05/05/19 0602 05/06/19 0302 05/07/19 0254 05/08/19 0513  NA 140   < > 139 139 139 136 137  K 3.6   < > 4.0 3.7 4.0 3.9 3.3*  CL 92*   < > 86* 89* 83* 85* 87*  CO2 37*   < > 39* 40* 39* 37* 37*  GLUCOSE 197*   < > 249* 202* 176* 157* 123*  BUN 63*   < > 61* 67* 78* 86* 90*  CREATININE 2.01*   < > 1.85* 1.82* 2.37* 2.29* 1.99*  CALCIUM 9.2   < > 9.4 9.4 9.6 9.5 9.4  MG 2.4  --   --   --   --   --   --    < > = values in this interval not displayed.   GFR: Estimated Creatinine Clearance: 45.4 mL/min (A) (by C-G formula based on SCr of 1.99 mg/dL (H)). Liver Function Tests: No results for input(s): AST, ALT, ALKPHOS, BILITOT, PROT, ALBUMIN in the last 168 hours. No results for input(s): LIPASE, AMYLASE in the last 168 hours. No results for input(s): AMMONIA in the last 168 hours. Coagulation Profile: No results for input(s): INR, PROTIME in the last 168 hours. Cardiac Enzymes: No results  for input(s): CKTOTAL, CKMB, CKMBINDEX, TROPONINI in the last 168 hours. BNP (last 3 results) No results for input(s): PROBNP in the last 8760 hours. HbA1C: No results for input(s): HGBA1C in the last 72 hours. CBG: Recent Labs  Lab 05/07/19 1135 05/07/19 1607 05/07/19 2116 05/08/19 0609 05/08/19 1139  GLUCAP 221* 256* 164* 141* 149*   Lipid Profile: No results for input(s): CHOL, HDL, LDLCALC, TRIG, CHOLHDL, LDLDIRECT in the last 72 hours. Thyroid Function Tests: No results for input(s): TSH, T4TOTAL, FREET4, T3FREE, THYROIDAB in the last 72 hours. Anemia Panel: No results for input(s): VITAMINB12, FOLATE, FERRITIN, TIBC, IRON, RETICCTPCT in the last 72 hours. Urine analysis:    Component Value Date/Time   COLORURINE STRAW (A) 05/01/2019 0258   APPEARANCEUR CLEAR 05/01/2019 0258   LABSPEC 1.009 05/01/2019 0258   PHURINE 5.0 05/01/2019 0258   GLUCOSEU 150 (A) 05/01/2019 0258   HGBUR NEGATIVE 05/01/2019 0258   BILIRUBINUR NEGATIVE 05/01/2019 0258   KETONESUR NEGATIVE 05/01/2019 0258   PROTEINUR NEGATIVE 05/01/2019 0258   UROBILINOGEN 0.2 08/22/2015 1820   NITRITE NEGATIVE 05/01/2019 0258   LEUKOCYTESUR MODERATE (A) 05/01/2019 0258     Javiana Anwar M.D. [facility] 05/08/2019, 2:23 PM  Pager: [phone] Between 7am to 7pm - call Pager - [phone]  After 7pm go to [email or web address] - password [email or web address]  Call night coverage person covering after 7pm

## 2019-05-08 NOTE — Plan of Care (Signed)
  Problem: Education: Goal: Ability to demonstrate management of disease process will improve Outcome: Progressing Goal: Ability to verbalize understanding of medication therapies will improve Outcome: Progressing   

## 2019-05-09 DIAGNOSIS — L03119 Cellulitis of unspecified part of limb: Secondary | ICD-10-CM

## 2019-05-09 DIAGNOSIS — N3000 Acute cystitis without hematuria: Secondary | ICD-10-CM

## 2019-05-09 DIAGNOSIS — R601 Generalized edema: Secondary | ICD-10-CM

## 2019-05-09 DIAGNOSIS — I10 Essential (primary) hypertension: Secondary | ICD-10-CM

## 2019-05-09 LAB — BASIC METABOLIC PANEL
Anion gap: 14 (ref 5–15)
BUN: 87 mg/dL — ABNORMAL HIGH (ref 8–23)
CO2: 37 mmol/L — ABNORMAL HIGH (ref 22–32)
Calcium: 9.4 mg/dL (ref 8.9–10.3)
Chloride: 87 mmol/L — ABNORMAL LOW (ref 98–111)
Creatinine, Ser: 1.65 mg/dL — ABNORMAL HIGH (ref 0.61–1.24)
GFR calc Af Amer: 47 mL/min — ABNORMAL LOW (ref >60)
GFR calc non Af Amer: 40 mL/min — ABNORMAL LOW (ref >60)
Glucose, Bld: 128 mg/dL — ABNORMAL HIGH (ref 70–99)
Potassium: 3.8 mmol/L (ref 3.5–5.1)
Sodium: 138 mmol/L (ref 135–145)

## 2019-05-09 LAB — CBC
HCT: 36.2 % — ABNORMAL LOW (ref 39.0–52.0)
Hemoglobin: 11.8 g/dL — ABNORMAL LOW (ref 13.0–17.0)
MCH: 29.4 pg (ref 26.0–34.0)
MCHC: 32.6 g/dL (ref 30.0–36.0)
MCV: 90.3 fL (ref 80.0–100.0)
Platelets: 207 10*3/uL (ref 150–400)
RBC: 4.01 MIL/uL — ABNORMAL LOW (ref 4.22–5.81)
RDW: 14.6 % (ref 11.5–15.5)
WBC: 7.3 10*3/uL (ref 4.0–10.5)
nRBC: 0 % (ref 0.0–0.2)

## 2019-05-09 LAB — GLUCOSE, CAPILLARY
Glucose-Capillary: 124 mg/dL — ABNORMAL HIGH (ref 70–99)
Glucose-Capillary: 231 mg/dL — ABNORMAL HIGH (ref 70–99)
Glucose-Capillary: 142 mg/dL — ABNORMAL HIGH (ref 70–99)
Glucose-Capillary: 167 mg/dL — ABNORMAL HIGH (ref 70–99)

## 2019-05-09 MED ORDER — TORSEMIDE 20 MG PO TABS
80.0000 mg | ORAL_TABLET | Freq: Two times a day (BID) | ORAL | Status: DC
  Administered 2019-05-09 – 2019-05-11 (×5): 80 mg via ORAL
  Filled 2019-05-09 (×5): qty 4

## 2019-05-09 MED ORDER — POTASSIUM CHLORIDE CRYS ER 20 MEQ PO TBCR
20.0000 meq | EXTENDED_RELEASE_TABLET | Freq: Two times a day (BID) | ORAL | Status: DC
  Administered 2019-05-09 – 2019-05-11 (×5): 20 meq via ORAL
  Filled 2019-05-09: qty 1
  Filled 2019-05-09: qty 2
  Filled 2019-05-09: qty 1
  Filled 2019-05-09 (×4): qty 2
  Filled 2019-05-09: qty 1

## 2019-05-09 NOTE — Progress Notes (Signed)
Occupational Therapy Treatment Patient Details Name: Travis [name] MRN: [id number] DOB: 06/07/1946 Today's Date: 05/09/2019    History of present illness 73 y.o. male admitted with edema, SOB, CHF exacerbation. PMHx: HTN, HLD, DM, COPD on 2L at home, hypothyroidism, depression, CHF, RT BBB, RLS, OSA on CPAP, dementia, CKD stage III   OT comments  Pt progressing towards OT goals this session, improved activity tolerance for sit<>stand for transfers, and able to participate in sink level grooming with multiple sit<>Stand in STedy at sink. Pt required set up and min guard for oral care and hand/face washing at sink. Good isometric control for lowering to recliner. Lift pad underneath patient for RN staff to return to bed. Current POC remains appropriate and essential for Pt to return to PLOF.    Follow Up Recommendations  CIR;Supervision/Assistance - 24 hour    Equipment Recommendations  None recommended by OT    Recommendations for Other Services      Precautions / Restrictions Precautions Precautions: Fall Restrictions Weight Bearing Restrictions: No       Mobility Bed Mobility               General bed mobility comments: Pt sitting on EOB when OT arrived  Transfers Overall transfer level: Needs assistance   Transfers: Sit to/from Stand Sit to Stand: Mod assist;+2 physical assistance;From elevated surface         General transfer comment: +2 mod A for power up to Stedy from elevated bed x 2, mod A to power up from seated position on stedy; pt stood ~20 sec x 4 trials, tolerance limited by LE fatigue    Balance Overall balance assessment: Needs assistance Sitting-balance support: Single extremity supported;Feet supported Sitting balance-Leahy Scale: Fair Sitting balance - Comments: able to tolerate sitting EOB with UE support for >10 min   Standing balance support: Bilateral upper extremity supported Standing balance-Leahy Scale: Poor Standing balance  comment: reliant on bil UE support for standing                           ADL either performed or assessed with clinical judgement   ADL Overall ADL's : Needs assistance/impaired     Grooming: Wash/dry hands;Wash/dry face;Set up;Sitting Grooming Details (indicate cue type and reason): in recliner                 Toilet Transfer: Moderate assistance;+2 for physical assistance;+2 for safety/equipment;BSC Toilet Transfer Details (indicate cue type and reason): with stedy Toileting- Clothing Manipulation and Hygiene: Maximal assistance;+2 for physical assistance;+2 for safety/equipment;Sit to/from stand       Functional mobility during ADLs: (use of stedy for mobilization today)       Vision       Perception     Praxis      Cognition Arousal/Alertness: Awake/alert Behavior During Therapy: WFL for tasks assessed/performed Overall Cognitive Status: Impaired/Different from baseline Area of Impairment: Safety/judgement                         Safety/Judgement: Decreased awareness of safety;Decreased awareness of deficits   Problem Solving: Requires verbal cues;Requires tactile cues General Comments: Pt motivated, but fatigues quickly with little self-awareness        Exercises General Exercises - Lower Extremity Ankle Circles/Pumps: AROM;10 reps;Supine;Both Quad Sets: AROM;5 reps;Both;Supine Gluteal Sets: AROM;5 reps;Supine;Both   Shoulder Instructions       General Comments O2 via [place] throughout session, VSS  Pertinent Vitals/ Pain       Pain Assessment: Faces Faces Pain Scale: Hurts little more Pain Location: R side Pain Descriptors / Indicators: Sore Pain Intervention(s): Monitored during session;Repositioned  Home Living                                          Prior Functioning/Environment              Frequency  Min 2X/week        Progress Toward Goals  OT Goals(current goals can now be found  in the care plan section)  Progress towards OT goals: Progressing toward goals  Acute Rehab OT Goals Patient Stated Goal: Hopes to be able to go to CIR, ultimately wants to walk with a cane (has used a RW for past 6 months) OT Goal Formulation: With patient Time For Goal Achievement: 05/17/19 Potential to Achieve Goals: Good  Plan Discharge plan remains appropriate    Co-evaluation    PT/OT/SLP Co-Evaluation/Treatment: Yes Reason for Co-Treatment: Complexity of the patient's impairments (multi-system involvement);For patient/therapist safety;To address functional/ADL transfers PT goals addressed during session: Mobility/safety with mobility;Balance;Proper use of DME;Strengthening/ROM OT goals addressed during session: ADL's and self-care;Proper use of Adaptive equipment and DME;Strengthening/ROM      AM-PAC OT "6 Clicks" Daily Activity     Outcome Measure   Help from another person eating meals?: None Help from another person taking care of personal grooming?: None Help from another person toileting, which includes using toliet, bedpan, or urinal?: A Lot Help from another person bathing (including washing, rinsing, drying)?: A Lot Help from another person to put on and taking off regular upper body clothing?: A Little Help from another person to put on and taking off regular lower body clothing?: A Lot 6 Click Score: 17    End of Session Equipment Utilized During Treatment: Gait belt(Stedy)  OT Visit Diagnosis: Unsteadiness on feet (R26.81);Other abnormalities of gait and mobility (R26.89);Muscle weakness (generalized) (M62.81)   Activity Tolerance Patient tolerated treatment well   Patient Left in chair;with call bell/phone within reach;Other (comment)(lift pad beneath patient)   Nurse Communication Mobility status        Time: 4259-5638 OT Time Calculation (min): 30 min  Charges: OT General Charges $OT Visit: 1 Visit OT Treatments $Self Care/Home Management : 8-22  mins  Hulda Humphrey OTR/L Acute Rehabilitation Services Pager: [phone] Office: [place] 05/09/2019, 11:02 AM

## 2019-05-09 NOTE — Progress Notes (Signed)
Inpatient Rehabilitation-Admissions Coordinator   [facility] has began insurance authorization for possible admit.   Will follow up once there is a determination.   Jhonnie Garner, OTR/L  Rehab Admissions Coordinator  [phone] 05/09/2019 1:17 PM

## 2019-05-09 NOTE — Consult Note (Signed)
   [facility] CM Inpatient Consult   05/09/2019  KODIE KISHI [birth year] [id number]   Follow up:  Checking progress notes and patient is currently being recommended for inpatient rehabilitation and awaiting insurance authorization.  Will continue to follow for progress and disposition need for post facility needs.  Natividad Brood, RN BSN [facility] Liaison  [phone] business mobile phone Toll free office [phone]  Fax number: [phone] [email or web address]  [email or web address]

## 2019-05-09 NOTE — Progress Notes (Signed)
Advanced Heart Failure Rounding Note   Subjective:    Off lasix over the weekend due to AKI. Creatinine now  Back to baseline at 1.6. Weight up 8 pounds. Denies CP or SOB. Very weak. Plans for CIR.   Objective:   Weight Range:  Vital Signs:   Temp:  [98 F (36.7 C)-98.6 F (37 C)] 98.4 F (36.9 C) (05/11 0920) Pulse Rate:  [58-65] 60 (05/11 0922) Resp:  [18-20] 20 (05/11 0442) BP: (127-138)/(41-75) 127/65 (05/11 0920) SpO2:  [94 %-96 %] 94 % (05/11 0920) Weight:  [130.3 kg] 130.3 kg (05/11 0325) Last BM Date: 05/08/19  Weight change: Filed Weights   05/06/19 0329 05/07/19 0513 05/09/19 0325  Weight: 126.9 kg 126.7 kg 130.3 kg    Intake/Output:   Intake/Output Summary (Last 24 hours) at 05/09/2019 1109 Last data filed at 05/09/2019 0953 Gross per 24 hour  Intake 924 ml  Output 425 ml  Net 499 ml     Physical Exam: General:  Sitting up in chair No resp difficulty HEENT: normal Neck: supple. JVP 8-9 Carotids 2+ bilat; no bruits. No lymphadenopathy or thryomegaly appreciated. Cor: PMI nondisplaced. Regular rate & rhythm. No rubs, gallops or murmurs. Lungs: clear Abdomen: marked central obesity soft, nontender, nondistended. No hepatosplenomegaly. No bruits or masses. Good bowel sounds. Extremities: no cyanosis, clubbing, rash, tr-1+ edema chronic venous stasis changes.  Neuro: alert & orientedx3, cranial nerves grossly intact. moves all 4 extremities w/o difficulty. Affect pleasant    Telemetry: NSR  50-60s Personally reviewed   Labs: Basic Metabolic Panel: Recent Labs  Lab 05/05/19 0602 05/06/19 0302 05/07/19 0254 05/08/19 0513 05/09/19 0622  NA 139 139 136 137 138  K 3.7 4.0 3.9 3.3* 3.8  CL 89* 83* 85* 87* 87*  CO2 40* 39* 37* 37* 37*  GLUCOSE 202* 176* 157* 123* 128*  BUN 67* 78* 86* 90* 87*  CREATININE 1.82* 2.37* 2.29* 1.99* 1.65*  CALCIUM 9.4 9.6 9.5 9.4 9.4    Liver Function Tests: No results for input(s): AST, ALT, ALKPHOS, BILITOT,  PROT, ALBUMIN in the last 168 hours. No results for input(s): LIPASE, AMYLASE in the last 168 hours. No results for input(s): AMMONIA in the last 168 hours.  CBC: Recent Labs  Lab 05/03/19 0343 05/06/19 0302 05/07/19 0254 05/08/19 0513 05/09/19 0622  WBC 9.2 8.3 9.0 7.9 7.3  NEUTROABS 7.4  --   --   --   --   HGB 12.3* 12.8* 12.9* 11.9* 11.8*  HCT 38.3* 40.0 40.3 37.2* 36.2*  MCV 91.4 90.9 90.0 89.9 90.3  PLT 186 210 220 223 207    Cardiac Enzymes: No results for input(s): CKTOTAL, CKMB, CKMBINDEX, TROPONINI in the last 168 hours.  BNP: BNP (last 3 results) Recent Labs    10/27/18 1013 03/02/19 1157 05/01/19 0123  BNP 38.1 44.4 87.6    ProBNP (last 3 results) No results for input(s): PROBNP in the last 8760 hours.    Other results:  Imaging: No results found.   Medications:     Scheduled Medications: . aspirin EC  81 mg Oral Daily  . atorvastatin  20 mg Oral q1800  . chlorhexidine  15 mL Mouth Rinse BID  . docusate sodium  200 mg Oral QHS  . enoxaparin (LOVENOX) injection  60 mg Subcutaneous Q24H  . escitalopram  20 mg Oral Daily  . fesoterodine  8 mg Oral Daily  . gabapentin  400 mg Oral TID  . insulin aspart  0-15 Units Subcutaneous  TID WC  . insulin aspart  0-5 Units Subcutaneous QHS  . insulin aspart  12 Units Subcutaneous TID WC  . insulin glargine  50 Units Subcutaneous QHS  . levothyroxine  175 mcg Oral Daily  . mouth rinse  15 mL Mouth Rinse q12n4p  . metoprolol tartrate  12.5 mg Oral BID  . multivitamin with minerals  1 tablet Oral QHS  . pramipexole  0.5 mg Oral q morning - 10a  . pramipexole  1 mg Oral QPM  . Ensure Max Protein  11 oz Oral QHS  . sodium chloride flush  3 mL Intravenous Q12H  . spironolactone  12.5 mg Oral Daily  . tamsulosin  0.4 mg Oral QHS    Infusions: . sodium chloride 250 mL (05/05/19 1528)    PRN Medications: sodium chloride, acetaminophen, albuterol, alum & mag hydroxide-simeth,  dextromethorphan-guaiFENesin, Gerhardt's butt cream, hydrALAZINE, loperamide, ondansetron (ZOFRAN) IV, sodium chloride flush, traZODone   Assessment:   Nghia P Davisis a 73 y.o.malewith a history of diastolic heart failure, HTN, hypothyroidism, OSA, DMII, 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  Admitted with concern for sepsis due to UTI/BLE cellulitis and volume overload.   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 Reviewed by Dr. Mauricia Area determined to be negative. Myeloma panel with no M-spike, normal IFE. - Echo 5/4 EF 60-65%, normal RV - Off diuretics since last week due to AKI. Weight now climbing back up. Creatinine improved.  -  Restart home torsemide/metolazone carefully (80 bid) with metolazone 2.5 weekly on Fridays -Continue spiro 25 daily - Intolerant to [place] with UTI and rash. - Continue UNNA boots.   2. ID -> UTI - Blood cx NGTD. UA+ mod leukocytes. Urine cx with multiple species, suggested recollection - Treated with Zsoyn  3.Chronic Hypoxic Respiratory Failure/COPD - On 3 L O2 qHS at home. Currently on 4 L. Wears BiPAP qHS.   4. Hypokalemia - K 3.8   5. Acute on CKD Stage III - Creatinine 1.82 -> 2.37 -> 1.65 due to overdiuresis.  - Restart diuretics as above -Monitor closely with diuresis.   6. OSA -ContinueBiPAP qHS - Follows with Dr. Halford Chessman.  7. CAD - Non-obstructive. LHC 08/2015: LM 40, LAD distal 40, LCx ostial 50, RCA proximal 40 -No s/s ischemia. - On ASA/statin - Can stop b-blocker   8. DM - Uncontrolled. A1C is 10.0 - Intolerant to [place] with UTI and rash. - Per primary  9. +D-dimer - BLE dopplers negative for DVT  10. Deconditioning - Pending CIR placement    Length of Stay: 8     MD  05/09/2019, 11:09 AM  Advanced Heart Failure Team Pager [phone] (M-F; [place])  Please contact [facility] for night-coverage after hours (4p -7a ) and weekends on [email or web address]

## 2019-05-09 NOTE — Progress Notes (Signed)
Physical Therapy Treatment Patient Details Name: Travis [name] MRN: [id number] DOB: 12-02-1946 Today's Date: 05/09/2019    History of Present Illness 73 y.o. male admitted with edema, SOB, CHF exacerbation. PMHx: HTN, HLD, DM, COPD on 2L at home, hypothyroidism, depression, CHF, RT BBB, RLS, OSA on CPAP, dementia, CKD stage III    PT Comments    Sit to stand x 4 trials with +2 mod assist. Pt able to stand ~20 seconds, duration limited by fatigue. Instructed pt in LE strengthening exercises. Pt puts forth good effort.   Follow Up Recommendations  CIR     Equipment Recommendations  None recommended by PT    Recommendations for Other Services       Precautions / Restrictions Precautions Precautions: Fall Restrictions Weight Bearing Restrictions: No    Mobility  Bed Mobility               General bed mobility comments: seated EoB on arrival   Transfers Overall transfer level: Needs assistance   Transfers: Sit to/from Stand Sit to Stand: Mod assist;+2 physical assistance;From elevated surface         General transfer comment: +2 mod A for power up to Stedy from elevated bed x 2, mod A to power up from seated position on stedy; pt stood ~20 sec x 4 trials, tolerance limited by LE fatigue  Ambulation/Gait                 Stairs             Wheelchair Mobility    Modified Rankin (Stroke Patients Only)       Balance Overall balance assessment: Needs assistance   Sitting balance-Leahy Scale: Fair       Standing balance-Leahy Scale: Poor Standing balance comment: reliant on bil UE support for standing                            Cognition Arousal/Alertness: Awake/alert Behavior During Therapy: WFL for tasks assessed/performed Overall Cognitive Status: Impaired/Different from baseline                               Problem Solving: Requires verbal cues;Requires tactile cues        Exercises General Exercises  - Lower Extremity Ankle Circles/Pumps: AROM;10 reps;Supine;Both Quad Sets: AROM;5 reps;Both;Supine Gluteal Sets: AROM;5 reps;Supine;Both    General Comments General comments (skin integrity, edema, etc.): Pt on 2L O2 throughout PT session      Pertinent Vitals/Pain Faces Pain Scale: Hurts little more Pain Location: R side Pain Descriptors / Indicators: Sore Pain Intervention(s): Limited activity within patient's tolerance;Monitored during session    Home Living                      Prior Function            PT Goals (current goals can now be found in the care plan section) Acute Rehab PT Goals Patient Stated Goal: Hopes to be able to go to CIR, ultimately wants to walk with a cane (has used a RW for past 6 months) PT Goal Formulation: With patient Time For Goal Achievement: 05/16/19 Potential to Achieve Goals: Fair Progress towards PT goals: Progressing toward goals    Frequency    Min 3X/week      PT Plan Current plan remains appropriate    Co-evaluation PT/OT/SLP Co-Evaluation/Treatment: Yes Reason for  Co-Treatment: Complexity of the patient's impairments (multi-system involvement);For patient/therapist safety;To address functional/ADL transfers PT goals addressed during session: Mobility/safety with mobility;Balance;Proper use of DME;Strengthening/ROM        AM-PAC PT "6 Clicks" Mobility   Outcome Measure  Help needed turning from your back to your side while in a flat bed without using bedrails?: A Lot Help needed moving from lying on your back to sitting on the side of a flat bed without using bedrails?: A Lot Help needed moving to and from a bed to a chair (including a wheelchair)?: A Lot Help needed standing up from a chair using your arms (e.g., wheelchair or bedside chair)?: A Lot Help needed to walk in hospital room?: A Lot Help needed climbing 3-5 steps with a railing? : Total 6 Click Score: 11    End of Session Equipment Utilized During  Treatment: Gait belt;Oxygen Activity Tolerance: Patient tolerated treatment well Patient left: in chair;with call bell/phone within reach;with chair alarm set Nurse Communication: Mobility status;Precautions;Need for lift equipment PT Visit Diagnosis: Other abnormalities of gait and mobility (R26.89);Muscle weakness (generalized) (M62.81);Unsteadiness on feet (R26.81);Difficulty in walking, not elsewhere classified (R26.2)     Time: 8453-6468 PT Time Calculation (min) (ACUTE ONLY): 23 min  Charges:  $Therapeutic Activity: 8-22 mins                     Blondell Reveal Kistler PT 05/09/2019  Acute Rehabilitation Services Pager [phone] Office [phone]

## 2019-05-09 NOTE — Progress Notes (Signed)
[facility]                                                                              Patient Demographics  Travis [name], is a 73 y.o. male, DOB - 1946-06-29, CNO:[id number]  Admit date - 04/30/2019   Admitting Physician Ivor Costa, MD  Outpatient Primary MD for the patient is [name], MD  Outpatient specialists:   LOS - 8  days   Medical records reviewed and are as summarized below:    Chief Complaint  Patient presents with   Shortness of Breath       Brief summary   Patient is a 73 year old male with hypertension, hyperlipidemia, diabetes, COPD on 2 L nasal cannula at home, hypothyroidism, CHF, OSA on CPAP, dementia, CKD stage III presented with bilateral leg edema, pain, anasarca and shortness of breath.  His legs were also erythematous. In ED, patient was found to have BNP 87.6, negative troponins, stable renal function, chest x-ray showed low volume and vascular congestion without obvious infiltration. Rapid COVID test negative  Assessment & Plan    Principal Problem:   Acute on chronic diastolic CHF (congestive heart failure) ([place]) -Presented with anasarca, shortness of breath, bilateral lower extremity edema.  Chest x-ray showed pulmonary vascular congestion -Patient was placed on Lasix drip, metolazone, CHF team followed closely.  Anasarca significantly improved.  Negative balance of ~11L -Creatinine was trending up hence Lasix drip, metolazone were held. -Weight now trending up, creatinine back to baseline, restarting home dose of torsemide 80 mg twice a day and metolazone 2.5 mg weekly on Fridays.  Continue Aldactone 12.5 mg daily.  Active Problems: Lower extremity cellulitis -Blood cultures negative, MRSA screen negative, urine culture with multiple species, was on antibiotics prior to admission for UTI -COVID test negative -Completed doxycycline x 8 days, continue Unna boots  Type II IDDM, uncontrolled with CKD -CBGs controlled  today, continue Lantus, meal coverage, sliding scale insulin   Acute on CKD stage III -Creatinine trended up to 2.3, likely due to aggressive diuresis  -Now improving at baseline, restarted on torsemide, follow closely  COPD  -Stable, no wheezing   Depression - cont lexapro, currently stable, no SI, HI   Restless leg syndrome - cont pramipexole   Morbid obesity  BMI 37, recommended diet and weight control  Sacral stage II pressure ulcer, POA Continue wound care  Failure to thrive, generalized debility -Reported falls at home, uses wheelchair/walker at home - PT recommended skilled nursing facility however patient and wife declined -CIR following, awaiting insurance approval   Code Status: Full code DVT Prophylaxis:  Family Communication: Discussed in detail with the patient, all imaging results, lab results explained to the patient.  Discussed with patient's daughter-in-law at the bedside and wife on the phone on 5/10   Disposition Plan: Weakness improving however still one-person assist on ambulating or getting up.  Not safe to be discharged home, awaiting insurance approval for CIR.  Possible DC to CIR today or tomorrow if approved.   Time Spent in minutes 25 minutes  Procedures:  None  Consultants:   Cardiology CIR  Antimicrobials:   Anti-infectives (From admission,  onward)   Start     Dose/Rate Route Frequency Ordered Stop   05/02/19 0500  vancomycin (VANCOCIN) 1,500 mg in sodium chloride 0.9 % 500 mL IVPB  Status:  Discontinued     1,500 mg 250 mL/hr over 120 Minutes Intravenous Every 24 hours 05/01/19 0358 05/01/19 1500   05/01/19 1515  doxycycline (VIBRA-TABS) tablet 100 mg  Status:  Discontinued     100 mg Oral Every 12 hours 05/01/19 1500 05/09/19 0815   05/01/19 1200  piperacillin-tazobactam (ZOSYN) IVPB 3.375 g  Status:  Discontinued     3.375 g 12.5 mL/hr over 240 Minutes Intravenous Every 8 hours 05/01/19 0358 05/06/19 0816   05/01/19 0400   vancomycin (VANCOCIN) IVPB 1000 mg/200 mL premix     1,000 mg 200 mL/hr over 60 Minutes Intravenous  Once 05/01/19 0355 05/01/19 0544   05/01/19 0245  vancomycin (VANCOCIN) IVPB 1000 mg/200 mL premix  Status:  Discontinued     1,000 mg 200 mL/hr over 60 Minutes Intravenous  Once 05/01/19 0235 05/01/19 0437   05/01/19 0245  piperacillin-tazobactam (ZOSYN) IVPB 3.375 g     3.375 g 100 mL/hr over 30 Minutes Intravenous  Once 05/01/19 0235 05/01/19 0413         Medications  Scheduled Meds:  aspirin EC  81 mg Oral Daily   atorvastatin  20 mg Oral q1800   chlorhexidine  15 mL Mouth Rinse BID   docusate sodium  200 mg Oral QHS   enoxaparin (LOVENOX) injection  60 mg Subcutaneous Q24H   escitalopram  20 mg Oral Daily   fesoterodine  8 mg Oral Daily   gabapentin  400 mg Oral TID   insulin aspart  0-15 Units Subcutaneous TID WC   insulin aspart  0-5 Units Subcutaneous QHS   insulin aspart  12 Units Subcutaneous TID WC   insulin glargine  50 Units Subcutaneous QHS   levothyroxine  175 mcg Oral Daily   mouth rinse  15 mL Mouth Rinse q12n4p   multivitamin with minerals  1 tablet Oral QHS   potassium chloride  20 mEq Oral BID   pramipexole  0.5 mg Oral q morning - 10a   pramipexole  1 mg Oral QPM   Ensure Max Protein  11 oz Oral QHS   sodium chloride flush  3 mL Intravenous Q12H   spironolactone  12.5 mg Oral Daily   tamsulosin  0.4 mg Oral QHS   torsemide  80 mg Oral BID   Continuous Infusions:  sodium chloride 250 mL (05/05/19 1528)   PRN Meds:.sodium chloride, acetaminophen, albuterol, alum & mag hydroxide-simeth, dextromethorphan-guaiFENesin, Gerhardt's butt cream, hydrALAZINE, loperamide, ondansetron (ZOFRAN) IV, sodium chloride flush, traZODone      Subjective:   Travis [name] was seen and examined today.  Overall improving, denies any specific complaints.  Feels weakness is improving but not back to baseline.  Still very deconditioned.  Leg  swelling is improving.   Patient denies dizziness, abdominal pain, N/V/D/C, new weakness, numbess, tingling. No acute events overnight.    Objective:   Vitals:   05/09/19 0442 05/09/19 0920 05/09/19 0922 05/09/19 1120  BP: (!) 129/41 127/65  (!) 130/54  Pulse: (!) 58 (!) 59 60 (!) 51  Resp: 20   18  Temp: 98.6 F (37 C) 98.4 F (36.9 C)  97.9 F (36.6 C)  TempSrc: Oral Oral  Oral  SpO2: 95% 94%  95%  Weight:      Height:  Intake/Output Summary (Last 24 hours) at 05/09/2019 1504 Last data filed at 05/09/2019 1425 Gross per 24 hour  Intake 906 ml  Output 725 ml  Net 181 ml     Wt Readings from Last 3 Encounters:  05/09/19 130.3 kg  04/15/19 129.7 kg  03/04/19 126.1 kg   Physical Exam  General: Alert and oriented x 3, NAD  Eyes:   HEENT:  Atraumatic, normocephalic  Cardiovascular: S1 S2 clear, RRR.   Respiratory: CTAB, no wheezing, rales or rhonchi  Gastrointestinal: Soft, nontender, nondistended, NBS  Ext: 1+ edema with chronic venous stasis changes  Neuro: no new deficits  Musculoskeletal: No cyanosis, clubbing  Skin: Chronic venous stasis changes with Unna +  Psych: Normal affect and demeanor, alert and oriented x3    Data Reviewed:  I have personally reviewed following labs and imaging studies  Micro Results Recent Results (from the past 240 hour(s))  Urine Culture     Status: Abnormal   Collection Time: 05/01/19  2:58 AM  Result Value Ref Range Status   Specimen Description URINE, RANDOM  Final   Special Requests   Final    NONE Performed at [facility], [address], [place], [place] [address]    Culture MULTIPLE SPECIES PRESENT, SUGGEST RECOLLECTION (A)  Final   Report Status 05/02/2019 FINAL  Final  SARS Coronavirus 2 [facility] order, Performed in [place] hospital lab)     Status: None   Collection Time: 05/01/19  3:24 AM  Result Value Ref Range Status   SARS Coronavirus 2 NEGATIVE NEGATIVE Final    Comment: (NOTE) If  result is NEGATIVE SARS-CoV-2 target nucleic acids are NOT DETECTED. The SARS-CoV-2 RNA is generally detectable in upper and lower  respiratory specimens during the acute phase of infection. The lowest  concentration of SARS-CoV-2 viral copies this assay can detect is 250  copies / mL. A negative result does not preclude SARS-CoV-2 infection  and should not be used as the sole basis for treatment or other  patient management decisions.  A negative result may occur with  improper specimen collection / handling, submission of specimen other  than nasopharyngeal swab, presence of viral mutation(s) within the  areas targeted by this assay, and inadequate number of viral copies  (<250 copies / mL). A negative result must be combined with clinical  observations, patient history, and epidemiological information. If result is POSITIVE SARS-CoV-2 target nucleic acids are DETECTED. The SARS-CoV-2 RNA is generally detectable in upper and lower  respiratory specimens dur ing the acute phase of infection.  Positive  results are indicative of active infection with SARS-CoV-2.  Clinical  correlation with patient history and other diagnostic information is  necessary to determine patient infection status.  Positive results do  not rule out bacterial infection or co-infection with other viruses. If result is PRESUMPTIVE POSTIVE SARS-CoV-2 nucleic acids MAY BE PRESENT.   A presumptive positive result was obtained on the submitted specimen  and confirmed on repeat testing.  While 2019 novel coronavirus  (SARS-CoV-2) nucleic acids may be present in the submitted sample  additional confirmatory testing may be necessary for epidemiological  and / or clinical management purposes  to differentiate between  SARS-CoV-2 and other Sarbecovirus currently known to infect humans.  If clinically indicated additional testing with an alternate test  methodology [phone]) is advised. The SARS-CoV-2 RNA is generally    detectable in upper and lower respiratory sp ecimens during the acute  phase of infection. The expected  result is Negative. Fact Sheet for Patients:  StrictlyIdeas.no Fact Sheet for Healthcare Providers: BankingDealers.co.za This test is not yet approved or cleared by the [place] FDA and has been authorized for detection and/or diagnosis of SARS-CoV-2 by FDA under an Emergency Use Authorization (EUA).  This EUA will remain in effect (meaning this test can be used) for the duration of the COVID-19 declaration under Section 564(b)(1) of the Act, 21 U.S.C. section 360bbb-3(b)(1), unless the authorization is terminated or revoked sooner. Performed at [facility], [place] [address], [place], [place] [address]   Culture, blood (Routine X 2) w Reflex to ID Panel     Status: None   Collection Time: 05/01/19  3:52 AM  Result Value Ref Range Status   Specimen Description BLOOD RIGHT ARM  Final   Special Requests   Final    BOTTLES DRAWN AEROBIC AND ANAEROBIC Blood Culture results may not be optimal due to an excessive volume of blood received in culture bottles   Culture   Final    NO GROWTH 5 DAYS Performed at [facility], [place] [address] [place], [place] [address]    Report Status 05/06/2019 FINAL  Final  Culture, blood (Routine X 2) w Reflex to ID Panel     Status: None   Collection Time: 05/01/19  3:52 AM  Result Value Ref Range Status   Specimen Description BLOOD LEFT ARM  Final   Special Requests   Final    BOTTLES DRAWN AEROBIC ONLY Blood Culture adequate volume   Culture   Final    NO GROWTH 5 DAYS Performed at [facility], [place] [address] [place], [place] [address]    Report Status 05/06/2019 FINAL  Final  MRSA PCR Screening     Status: None   Collection Time: 05/01/19  3:23 PM  Result Value Ref Range Status   MRSA by PCR NEGATIVE NEGATIVE Final    Comment:        The GeneXpert MRSA Assay (FDA approved  for NASAL specimens only), is one component of a comprehensive MRSA colonization surveillance program. It is not intended to diagnose MRSA infection nor to guide or monitor treatment for MRSA infections. Performed at [facility], [place] [address] [place], [place] [address]     Radiology Reports Dg Chest Portable 1 View  Result Date: 05/01/2019 CLINICAL DATA:  Shortness of breath EXAM: PORTABLE CHEST 1 VIEW COMPARISON:  11/29/2018 FINDINGS: Very low lung volumes. Bibasilar atelectasis. Vascular congestion. No visible effusions or acute bony abnormality. IMPRESSION: Very low lung volumes with bibasilar atelectasis and vascular congestion. Electronically Signed   By: Rolm Baptise M.D.   On: 05/01/2019 00:50   Vas [place] Lower Extremity Venous (dvt)  Result Date: 05/02/2019  Lower Venous Study Indications: Cellulitis.  Limitations: Poor ultrasound/tissue interface. Performing Technologist: Oliver Hum RVT  Examination Guidelines: A complete evaluation includes B-mode imaging, spectral Doppler, color Doppler, and power Doppler as needed of all accessible portions of each vessel. Bilateral testing is considered an integral part of a complete examination. Limited examinations for reoccurring indications may be performed as noted.  +---------+---------------+---------+-----------+----------+-------+  RIGHT     Compressibility Phasicity Spontaneity Properties Summary  +---------+---------------+---------+-----------+----------+-------+  CFV       Full            Yes       Yes                             +---------+---------------+---------+-----------+----------+-------+  SFJ       Full                                                      +---------+---------------+---------+-----------+----------+-------+  FV Prox   Full                                                      +---------+---------------+---------+-----------+----------+-------+  FV Mid    Full                                                       +---------+---------------+---------+-----------+----------+-------+  FV Distal Full            Yes       Yes                             +---------+---------------+---------+-----------+----------+-------+  PFV       Full                                                      +---------+---------------+---------+-----------+----------+-------+  POP       Full            Yes       Yes                             +---------+---------------+---------+-----------+----------+-------+  PTV       Full                                                      +---------+---------------+---------+-----------+----------+-------+  PERO      Full                                                      +---------+---------------+---------+-----------+----------+-------+   +---------+---------------+---------+-----------+----------+--------------+  LEFT      Compressibility Phasicity Spontaneity Properties Summary         +---------+---------------+---------+-----------+----------+--------------+  CFV       Full            Yes       Yes                                    +---------+---------------+---------+-----------+----------+--------------+  SFJ       Full                                                             +---------+---------------+---------+-----------+----------+--------------+  FV Prox   Full                                                             +---------+---------------+---------+-----------+----------+--------------+  FV Mid    Full                                                             +---------+---------------+---------+-----------+----------+--------------+  FV Distal Full            Yes       Yes                                    +---------+---------------+---------+-----------+----------+--------------+  PFV       Full                                                             +---------+---------------+---------+-----------+----------+--------------+  POP       Full            Yes       Yes                                     +---------+---------------+---------+-----------+----------+--------------+  PTV       Full                                                             +---------+---------------+---------+-----------+----------+--------------+  PERO                                                       Not visualized  +---------+---------------+---------+-----------+----------+--------------+     Summary: Right: There is no evidence of deep vein thrombosis in the lower extremity. However, portions of this examination were limited- see technologist comments above. No cystic structure found in the popliteal fossa. Left: There is no evidence of deep vein thrombosis in the lower extremity. However, portions of this examination were limited- see technologist comments above. No cystic structure found in the popliteal fossa.  *See table(s) above for measurements and observations. Electronically signed by Harold Barban MD on 05/02/2019 at 11:09:50 AM.    Final     Lab Data:  CBC: Recent Labs  Lab 05/03/19 0343 05/06/19 0302 05/07/19 0254 05/08/19 0513 05/09/19 0622  WBC 9.2 8.3 9.0 7.9 7.3  NEUTROABS 7.4  --   --   --   --   HGB 12.3* 12.8* 12.9* 11.9*  11.8*  HCT 38.3* 40.0 40.3 37.2* 36.2*  MCV 91.4 90.9 90.0 89.9 90.3  PLT 186 210 220 223 004   Basic Metabolic Panel: Recent Labs  Lab 05/05/19 0602 05/06/19 0302 05/07/19 0254 05/08/19 0513 05/09/19 0622  NA 139 139 136 137 138  K 3.7 4.0 3.9 3.3* 3.8  CL 89* 83* 85* 87* 87*  CO2 40* 39* 37* 37* 37*  GLUCOSE 202* 176* 157* 123* 128*  BUN 67* 78* 86* 90* 87*  CREATININE 1.82* 2.37* 2.29* 1.99* 1.65*  CALCIUM 9.4 9.6 9.5 9.4 9.4   GFR: Estimated Creatinine Clearance: 55.6 mL/min (A) (by C-G formula based on SCr of 1.65 mg/dL (H)). Liver Function Tests: No results for input(s): AST, ALT, ALKPHOS, BILITOT, PROT, ALBUMIN in the last 168 hours. No results for input(s): LIPASE, AMYLASE in the last 168 hours. No results for  input(s): AMMONIA in the last 168 hours. Coagulation Profile: No results for input(s): INR, PROTIME in the last 168 hours. Cardiac Enzymes: No results for input(s): CKTOTAL, CKMB, CKMBINDEX, TROPONINI in the last 168 hours. BNP (last 3 results) No results for input(s): PROBNP in the last 8760 hours. HbA1C: No results for input(s): HGBA1C in the last 72 hours. CBG: Recent Labs  Lab 05/08/19 1139 05/08/19 1609 05/08/19 2120 05/09/19 0614 05/09/19 1120  GLUCAP 149* 133* 265* 124* 231*   Lipid Profile: No results for input(s): CHOL, HDL, LDLCALC, TRIG, CHOLHDL, LDLDIRECT in the last 72 hours. Thyroid Function Tests: No results for input(s): TSH, T4TOTAL, FREET4, T3FREE, THYROIDAB in the last 72 hours. Anemia Panel: No results for input(s): VITAMINB12, FOLATE, FERRITIN, TIBC, IRON, RETICCTPCT in the last 72 hours. Urine analysis:    Component Value Date/Time   COLORURINE STRAW (A) 05/01/2019 0258   APPEARANCEUR CLEAR 05/01/2019 0258   LABSPEC 1.009 05/01/2019 0258   PHURINE 5.0 05/01/2019 0258   GLUCOSEU 150 (A) 05/01/2019 0258   HGBUR NEGATIVE 05/01/2019 0258   BILIRUBINUR NEGATIVE 05/01/2019 0258   KETONESUR NEGATIVE 05/01/2019 0258   PROTEINUR NEGATIVE 05/01/2019 0258   UROBILINOGEN 0.2 08/22/2015 1820   NITRITE NEGATIVE 05/01/2019 0258   LEUKOCYTESUR MODERATE (A) 05/01/2019 0258     Melina Mosteller M.D. [facility] 05/09/2019, 3:04 PM  Pager: [phone] Between 7am to 7pm - call Pager - [phone]  After 7pm go to [email or web address] - password [email or web address]  Call night coverage person covering after 7pm

## 2019-05-10 LAB — BASIC METABOLIC PANEL
Anion gap: 9 (ref 5–15)
BUN: 83 mg/dL — ABNORMAL HIGH (ref 8–23)
CO2: 36 mmol/L — ABNORMAL HIGH (ref 22–32)
Calcium: 9.4 mg/dL (ref 8.9–10.3)
Chloride: 93 mmol/L — ABNORMAL LOW (ref 98–111)
Creatinine, Ser: 1.89 mg/dL — ABNORMAL HIGH (ref 0.61–1.24)
GFR calc Af Amer: 40 mL/min — ABNORMAL LOW (ref >60)
GFR calc non Af Amer: 34 mL/min — ABNORMAL LOW (ref >60)
Glucose, Bld: 155 mg/dL — ABNORMAL HIGH (ref 70–99)
Potassium: 3.8 mmol/L (ref 3.5–5.1)
Sodium: 138 mmol/L (ref 135–145)

## 2019-05-10 LAB — GLUCOSE, CAPILLARY
Glucose-Capillary: 183 mg/dL — ABNORMAL HIGH (ref 70–99)
Glucose-Capillary: 138 mg/dL — ABNORMAL HIGH (ref 70–99)
Glucose-Capillary: 184 mg/dL — ABNORMAL HIGH (ref 70–99)
Glucose-Capillary: 129 mg/dL — ABNORMAL HIGH (ref 70–99)

## 2019-05-10 NOTE — Progress Notes (Signed)
[facility]                                                                              Patient Demographics  Travis [name], is a 73 y.o. male, DOB - 11/17/1946, IOE:[id number]  Admit date - 04/30/2019   Admitting Physician Ivor Costa, MD  Outpatient Primary MD for the patient is [name], MD  Outpatient specialists:   LOS - 9  days   Medical records reviewed and are as summarized below:    Chief Complaint  Patient presents with   Shortness of Breath       Brief summary   Patient is a 72 year old male with hypertension, hyperlipidemia, diabetes, COPD on 2 L nasal cannula at home, hypothyroidism, CHF, OSA on CPAP, dementia, CKD stage III presented with bilateral leg edema, pain, anasarca and shortness of breath.  His legs were also erythematous. In ED, patient was found to have BNP 87.6, negative troponins, stable renal function, chest x-ray showed low volume and vascular congestion without obvious infiltration. Rapid COVID test negative  Assessment & Plan    Principal Problem:   Acute on chronic diastolic CHF (congestive heart failure) (HCC) Acute on chronic hypoxic respiratory failure -Presented with anasarca, shortness of breath, bilateral lower extremity edema.  Chest x-ray showed pulmonary vascular congestion -Patient was placed on Lasix drip, metolazone, CHF team followed closely.  Anasarca significantly improved.  Negative balance of ~11L -Creatinine was trending up hence Lasix drip, metolazone were held. -Weight Initially improved but then worsening and now stabilized again. -creatinine back to baseline, restarting home dose of torsemide 80 mg twice a day and metolazone 2.5 mg weekly on Fridays.  Continue Aldactone 12.5 mg daily. Still hypoxic needing 4 L of oxygen.  Baseline only uses 2 L with CPAP at night.  Active Problems: Lower extremity cellulitis -Blood cultures negative, MRSA screen negative, urine culture with multiple species, was  on antibiotics prior to admission for UTI -COVID test negative -Completed doxycycline x 8 days, continue Unna boots  Type II IDDM, uncontrolled with CKD -CBGs controlled today, continue Lantus, meal coverage, sliding scale insulin   Acute on CKD stage III -Creatinine trended up to 2.3, likely due to aggressive diuresis  -Now improving at baseline, restarted on torsemide, follow closely, recheck tomorrow before discharge  COPD  -Stable, no wheezing   Depression - cont lexapro, currently stable, no SI, HI   Restless leg syndrome - cont pramipexole   Morbid obesity  BMI 37, recommended diet and weight control  Sacral stage II pressure ulcer, POA Continue wound care  Failure to thrive, generalized debility -Reported falls at home, uses wheelchair/walker at home - PT recommended skilled nursing facility however patient and wife declined -CIR refused.  will be able to go home in 1 to 2 days.   Code Status: Full code DVT Prophylaxis:  Family Communication: Discussed in detail with the patient, all imaging results, lab results explained to the patient.    Disposition Plan: Patient still hypoxic.  Requiring more oxygen.  Symptomatically better.  Plan is to discharge home but will require significant assistance. Time Spent in minutes 25 minutes  Procedures:  None  Consultants:   Cardiology CIR  Antimicrobials:   Anti-infectives (From admission, onward)   Start     Dose/Rate Route Frequency Ordered Stop   05/02/19 0500  vancomycin (VANCOCIN) 1,500 mg in sodium chloride 0.9 % 500 mL IVPB  Status:  Discontinued     1,500 mg 250 mL/hr over 120 Minutes Intravenous Every 24 hours 05/01/19 0358 05/01/19 1500   05/01/19 1515  doxycycline (VIBRA-TABS) tablet 100 mg  Status:  Discontinued     100 mg Oral Every 12 hours 05/01/19 1500 05/09/19 0815   05/01/19 1200  piperacillin-tazobactam (ZOSYN) IVPB 3.375 g  Status:  Discontinued     3.375 g 12.5 mL/hr over 240 Minutes  Intravenous Every 8 hours 05/01/19 0358 05/06/19 0816   05/01/19 0400  vancomycin (VANCOCIN) IVPB 1000 mg/200 mL premix     1,000 mg 200 mL/hr over 60 Minutes Intravenous  Once 05/01/19 0355 05/01/19 0544   05/01/19 0245  vancomycin (VANCOCIN) IVPB 1000 mg/200 mL premix  Status:  Discontinued     1,000 mg 200 mL/hr over 60 Minutes Intravenous  Once 05/01/19 0235 05/01/19 0437   05/01/19 0245  piperacillin-tazobactam (ZOSYN) IVPB 3.375 g     3.375 g 100 mL/hr over 30 Minutes Intravenous  Once 05/01/19 0235 05/01/19 0413         Medications  Scheduled Meds:  aspirin EC  81 mg Oral Daily   atorvastatin  20 mg Oral q1800   chlorhexidine  15 mL Mouth Rinse BID   docusate sodium  200 mg Oral QHS   enoxaparin (LOVENOX) injection  60 mg Subcutaneous Q24H   escitalopram  20 mg Oral Daily   fesoterodine  8 mg Oral Daily   gabapentin  400 mg Oral TID   insulin aspart  0-15 Units Subcutaneous TID WC   insulin aspart  0-5 Units Subcutaneous QHS   insulin aspart  12 Units Subcutaneous TID WC   insulin glargine  50 Units Subcutaneous QHS   levothyroxine  175 mcg Oral Daily   mouth rinse  15 mL Mouth Rinse q12n4p   multivitamin with minerals  1 tablet Oral QHS   potassium chloride SA  20 mEq Oral BID   pramipexole  0.5 mg Oral q morning - 10a   pramipexole  1 mg Oral QPM   Ensure Max Protein  11 oz Oral QHS   sodium chloride flush  3 mL Intravenous Q12H   spironolactone  12.5 mg Oral Daily   tamsulosin  0.4 mg Oral QHS   torsemide  80 mg Oral BID   Continuous Infusions:  sodium chloride 250 mL (05/05/19 1528)   PRN Meds:.sodium chloride, acetaminophen, albuterol, alum & mag hydroxide-simeth, dextromethorphan-guaiFENesin, Gerhardt's butt cream, hydrALAZINE, loperamide, ondansetron (ZOFRAN) IV, sodium chloride flush, traZODone      Subjective:   Travis [name] was seen and examined today.  No acute complaint.  No nausea no vomiting.  Still some mild  shortness of breath.  No cough.  Able to sleep without any problems last night.  No diarrhea no constipation.  Objective:   Vitals:   05/10/19 0831 05/10/19 1158 05/10/19 1345 05/10/19 1350  BP: 113/61 (!) 112/48    Pulse: 64 (!) 59    Resp: 18 19    Temp:  97.6 F (36.4 C)    TempSrc:  Oral    SpO2: 96% 97% (!) 89% 96%  Weight:      Height:        Intake/Output Summary (Last 24  hours) at 05/10/2019 1741 Last data filed at 05/10/2019 1730 Gross per 24 hour  Intake 1224 ml  Output 2595 ml  Net -1371 ml     Wt Readings from Last 3 Encounters:  05/10/19 127.8 kg  04/15/19 129.7 kg  03/04/19 126.1 kg   Physical Exam  General: Alert and oriented x 3, NAD  Eyes:   HEENT:  Atraumatic, normocephalic  Cardiovascular: S1 S2 clear, RRR.   Respiratory: CTAB, no wheezing, rales or rhonchi  Gastrointestinal: Soft, nontender, nondistended, NBS  Ext: 1+ edema with chronic venous stasis changes  Neuro: no new deficits  Musculoskeletal: No cyanosis, clubbing  Skin: Chronic venous stasis changes with Unna +  Psych: Normal affect and demeanor, alert and oriented x3    Data Reviewed:  I have personally reviewed following labs and imaging studies  Micro Results Recent Results (from the past 240 hour(s))  Urine Culture     Status: Abnormal   Collection Time: 05/01/19  2:58 AM  Result Value Ref Range Status   Specimen Description URINE, RANDOM  Final   Special Requests   Final    NONE Performed at [facility], [address], [place], [place] [address]    Culture MULTIPLE SPECIES PRESENT, SUGGEST RECOLLECTION (A)  Final   Report Status 05/02/2019 FINAL  Final  SARS Coronavirus 2 [facility] order, Performed in [place] hospital lab)     Status: None   Collection Time: 05/01/19  3:24 AM  Result Value Ref Range Status   SARS Coronavirus 2 NEGATIVE NEGATIVE Final    Comment: (NOTE) If result is NEGATIVE SARS-CoV-2 target nucleic acids are NOT DETECTED. The  SARS-CoV-2 RNA is generally detectable in upper and lower  respiratory specimens during the acute phase of infection. The lowest  concentration of SARS-CoV-2 viral copies this assay can detect is 250  copies / mL. A negative result does not preclude SARS-CoV-2 infection  and should not be used as the sole basis for treatment or other  patient management decisions.  A negative result may occur with  improper specimen collection / handling, submission of specimen other  than nasopharyngeal swab, presence of viral mutation(s) within the  areas targeted by this assay, and inadequate number of viral copies  (<250 copies / mL). A negative result must be combined with clinical  observations, patient history, and epidemiological information. If result is POSITIVE SARS-CoV-2 target nucleic acids are DETECTED. The SARS-CoV-2 RNA is generally detectable in upper and lower  respiratory specimens dur ing the acute phase of infection.  Positive  results are indicative of active infection with SARS-CoV-2.  Clinical  correlation with patient history and other diagnostic information is  necessary to determine patient infection status.  Positive results do  not rule out bacterial infection or co-infection with other viruses. If result is PRESUMPTIVE POSTIVE SARS-CoV-2 nucleic acids MAY BE PRESENT.   A presumptive positive result was obtained on the submitted specimen  and confirmed on repeat testing.  While 2019 novel coronavirus  (SARS-CoV-2) nucleic acids may be present in the submitted sample  additional confirmatory testing may be necessary for epidemiological  and / or clinical management purposes  to differentiate between  SARS-CoV-2 and other Sarbecovirus currently known to infect humans.  If clinically indicated additional testing with an alternate test  methodology [phone]) is advised. The SARS-CoV-2 RNA is generally  detectable in upper and lower respiratory sp ecimens during the acute    phase of infection. The expected result is Negative. Fact  Sheet for Patients:  StrictlyIdeas.no Fact Sheet for Healthcare Providers: BankingDealers.co.za This test is not yet approved or cleared by the [place] FDA and has been authorized for detection and/or diagnosis of SARS-CoV-2 by FDA under an Emergency Use Authorization (EUA).  This EUA will remain in effect (meaning this test can be used) for the duration of the COVID-19 declaration under Section 564(b)(1) of the Act, 21 U.S.C. section 360bbb-3(b)(1), unless the authorization is terminated or revoked sooner. Performed at [facility], [place] [address], [place], [place] [address]   Culture, blood (Routine X 2) w Reflex to ID Panel     Status: None   Collection Time: 05/01/19  3:52 AM  Result Value Ref Range Status   Specimen Description BLOOD RIGHT ARM  Final   Special Requests   Final    BOTTLES DRAWN AEROBIC AND ANAEROBIC Blood Culture results may not be optimal due to an excessive volume of blood received in culture bottles   Culture   Final    NO GROWTH 5 DAYS Performed at [facility], [place] [address] [place], [place] [address]    Report Status 05/06/2019 FINAL  Final  Culture, blood (Routine X 2) w Reflex to ID Panel     Status: None   Collection Time: 05/01/19  3:52 AM  Result Value Ref Range Status   Specimen Description BLOOD LEFT ARM  Final   Special Requests   Final    BOTTLES DRAWN AEROBIC ONLY Blood Culture adequate volume   Culture   Final    NO GROWTH 5 DAYS Performed at [facility], [place] [address] [place], [place] [address]    Report Status 05/06/2019 FINAL  Final  MRSA PCR Screening     Status: None   Collection Time: 05/01/19  3:23 PM  Result Value Ref Range Status   MRSA by PCR NEGATIVE NEGATIVE Final    Comment:        The GeneXpert MRSA Assay (FDA approved for NASAL specimens only), is one component of a comprehensive MRSA  colonization surveillance program. It is not intended to diagnose MRSA infection nor to guide or monitor treatment for MRSA infections. Performed at [facility], [place] [address], [place], [place] [address]     Radiology Reports Dg Chest Portable 1 View  Result Date: 05/01/2019 CLINICAL DATA:  Shortness of breath EXAM: PORTABLE CHEST 1 VIEW COMPARISON:  11/29/2018 FINDINGS: Very low lung volumes. Bibasilar atelectasis. Vascular congestion. No visible effusions or acute bony abnormality. IMPRESSION: Very low lung volumes with bibasilar atelectasis and vascular congestion. Electronically Signed   By: Rolm Baptise M.D.   On: 05/01/2019 00:50   Vas [place] Lower Extremity Venous (dvt)  Result Date: 05/02/2019  Lower Venous Study Indications: Cellulitis.  Limitations: Poor ultrasound/tissue interface. Performing Technologist: Oliver Hum RVT  Examination Guidelines: A complete evaluation includes B-mode imaging, spectral Doppler, color Doppler, and power Doppler as needed of all accessible portions of each vessel. Bilateral testing is considered an integral part of a complete examination. Limited examinations for reoccurring indications may be performed as noted.  +---------+---------------+---------+-----------+----------+-------+  RIGHT     Compressibility Phasicity Spontaneity Properties Summary  +---------+---------------+---------+-----------+----------+-------+  CFV       Full            Yes       Yes                             +---------+---------------+---------+-----------+----------+-------+  SFJ       Full                                                      +---------+---------------+---------+-----------+----------+-------+  FV Prox   Full                                                      +---------+---------------+---------+-----------+----------+-------+  FV Mid    Full                                                       +---------+---------------+---------+-----------+----------+-------+  FV Distal Full            Yes       Yes                             +---------+---------------+---------+-----------+----------+-------+  PFV       Full                                                      +---------+---------------+---------+-----------+----------+-------+  POP       Full            Yes       Yes                             +---------+---------------+---------+-----------+----------+-------+  PTV       Full                                                      +---------+---------------+---------+-----------+----------+-------+  PERO      Full                                                      +---------+---------------+---------+-----------+----------+-------+   +---------+---------------+---------+-----------+----------+--------------+  LEFT      Compressibility Phasicity Spontaneity Properties Summary         +---------+---------------+---------+-----------+----------+--------------+  CFV       Full            Yes       Yes                                    +---------+---------------+---------+-----------+----------+--------------+  SFJ       Full                                                             +---------+---------------+---------+-----------+----------+--------------+  FV Prox   Full                                                             +---------+---------------+---------+-----------+----------+--------------+  FV Mid    Full                                                             +---------+---------------+---------+-----------+----------+--------------+  FV Distal Full            Yes       Yes                                    +---------+---------------+---------+-----------+----------+--------------+  PFV       Full                                                             +---------+---------------+---------+-----------+----------+--------------+  POP       Full            Yes       Yes                                     +---------+---------------+---------+-----------+----------+--------------+  PTV       Full                                                             +---------+---------------+---------+-----------+----------+--------------+  PERO                                                       Not visualized  +---------+---------------+---------+-----------+----------+--------------+     Summary: Right: There is no evidence of deep vein thrombosis in the lower extremity. However, portions of this examination were limited- see technologist comments above. No cystic structure found in the popliteal fossa. Left: There is no evidence of deep vein thrombosis in the lower extremity. However, portions of this examination were limited- see technologist comments above. No cystic structure found in the popliteal fossa.  *See table(s) above for measurements and observations. Electronically signed by Harold Barban MD on 05/02/2019 at 11:09:50 AM.    Final     Lab Data:  CBC: Recent Labs  Lab 05/06/19 0302 05/07/19 0254 05/08/19 0513 05/09/19 0622  WBC 8.3 9.0 7.9 7.3  HGB 12.8* 12.9* 11.9* 11.8*  HCT 40.0 40.3 37.2* 36.2*  MCV 90.9 90.0 89.9 90.3  PLT 210 220 223 207  Basic Metabolic Panel: Recent Labs  Lab 05/06/19 0302 05/07/19 0254 05/08/19 0513 05/09/19 0622 05/10/19 1119  NA 139 136 137 138 138  K 4.0 3.9 3.3* 3.8 3.8  CL 83* 85* 87* 87* 93*  CO2 39* 37* 37* 37* 36*  GLUCOSE 176* 157* 123* 128* 155*  BUN 78* 86* 90* 87* 83*  CREATININE 2.37* 2.29* 1.99* 1.65* 1.89*  CALCIUM 9.6 9.5 9.4 9.4 9.4   GFR: Estimated Creatinine Clearance: 48.1 mL/min (A) (by C-G formula based on SCr of 1.89 mg/dL (H)). Liver Function Tests: No results for input(s): AST, ALT, ALKPHOS, BILITOT, PROT, ALBUMIN in the last 168 hours. No results for input(s): LIPASE, AMYLASE in the last 168 hours. No results for input(s): AMMONIA in the last 168 hours. Coagulation Profile: No results for  input(s): INR, PROTIME in the last 168 hours. Cardiac Enzymes: No results for input(s): CKTOTAL, CKMB, CKMBINDEX, TROPONINI in the last 168 hours. BNP (last 3 results) No results for input(s): PROBNP in the last 8760 hours. HbA1C: No results for input(s): HGBA1C in the last 72 hours. CBG: Recent Labs  Lab 05/09/19 1629 05/09/19 2110 05/10/19 0625 05/10/19 1155 05/10/19 1640  GLUCAP 142* 167* 183* 138* 184*   Lipid Profile: No results for input(s): CHOL, HDL, LDLCALC, TRIG, CHOLHDL, LDLDIRECT in the last 72 hours. Thyroid Function Tests: No results for input(s): TSH, T4TOTAL, FREET4, T3FREE, THYROIDAB in the last 72 hours. Anemia Panel: No results for input(s): VITAMINB12, FOLATE, FERRITIN, TIBC, IRON, RETICCTPCT in the last 72 hours. Urine analysis:    Component Value Date/Time   COLORURINE STRAW (A) 05/01/2019 0258   APPEARANCEUR CLEAR 05/01/2019 0258   LABSPEC 1.009 05/01/2019 0258   PHURINE 5.0 05/01/2019 0258   GLUCOSEU 150 (A) 05/01/2019 0258   HGBUR NEGATIVE 05/01/2019 0258   BILIRUBINUR NEGATIVE 05/01/2019 0258   KETONESUR NEGATIVE 05/01/2019 0258   PROTEINUR NEGATIVE 05/01/2019 0258   UROBILINOGEN 0.2 08/22/2015 1820   NITRITE NEGATIVE 05/01/2019 0258   LEUKOCYTESUR MODERATE (A) 05/01/2019 0258     Berle Mull M.D. [facility] 05/10/2019, 5:41 PM  Between 7am to 7pm - call Pager - [phone]  After 7pm go to [email or web address] - password [email or web address]  Call night coverage person covering after 7pm

## 2019-05-10 NOTE — TOC Progression Note (Signed)
Transition of Care [facility]) - Progression Note    Patient Details  Name: Travis [name] MRN: [id number] Date of Birth: [birth year]  Transition of Care [facility] CM/SW Contact  Sherrilyn Rist Phone Number: [phone] 05/10/2019, 10:25 AM  Clinical Narrative:    05/10/2019 - Received message that patient's insurance declined approval for Inpatient Rehab per Pamala Hurry and he is refusing SNF placement; pt is active with [facility] prior to admission. Mindi Slicker RN,MHA,BSN  05/02/2019 - Patient lives at home with spouse; YEL:[id number], [name], MD; has private insurance with [employer] with prescription drug coverage; pharmacy of choice is [facility]; DME - rollator at home; is active with [facility] for [facility] services as prior to admission. Patient is refusing SNF at this time and wants to go home with resumption of [facility] services. Tiffany with [facility] made aware of admission.  Expected Discharge Plan: [place] Barriers to Discharge: No Barriers Identified   Expected Discharge Plan: [place] Barriers to Discharge: No Barriers Identified  Expected Discharge Plan and Services Expected Discharge Plan: [place] In-house Referral: NA Discharge Planning Services: CM Consult Post Acute Care Choice: [place] arrangements for the past 2 months: Single Family Home                 DME Arranged: [name] DME Agency: NA       HH Arranged: Disease Management, RN, PT [place] Agency: [facility] (formerly [employer]) Date [place]: 05/02/19 Time [place]: [place] Representative spoke with at [place]: Tiffany RN   Social Determinants of Health ([place]) Interventions    Readmission Risk Interventions No flowsheet data found.

## 2019-05-10 NOTE — Care Management Important Message (Signed)
Important Message  Patient Details  Name: Travis [name] MRN: [id number] Date of Birth: [birth year]   Medicare Important Message Given:  Yes    Orbie Pyo 05/10/2019, 2:45 PM

## 2019-05-10 NOTE — Progress Notes (Signed)
Pt placed on home CPAP auto titrate mode with 2 lpm oxygen.. Patient tolerating well.

## 2019-05-10 NOTE — Progress Notes (Signed)
Advanced Heart Failure Rounding Note   Subjective:    Torsemide restarted yesterday. Weight down 3 pounds. Denies SOB, orthopnea or PND. No BMET today  Objective:   Weight Range:  Vital Signs:   Temp:  [97.9 F (36.6 C)-98.6 F (37 C)] 98.6 F (37 C) (05/12 0502) Pulse Rate:  [51-64] 64 (05/12 0831) Resp:  [18] 18 (05/12 0831) BP: (111-144)/(51-66) 113/61 (05/12 0831) SpO2:  [95 %-97 %] 96 % (05/12 0831) Weight:  [127.8 kg] 127.8 kg (05/12 0502) Last BM Date: 05/08/19  Weight change: Filed Weights   05/07/19 0513 05/09/19 0325 05/10/19 0502  Weight: 126.7 kg 130.3 kg 127.8 kg    Intake/Output:   Intake/Output Summary (Last 24 hours) at 05/10/2019 1026 Last data filed at 05/10/2019 0848 Gross per 24 hour  Intake 1428 ml  Output 2895 ml  Net -1467 ml     Physical Exam: General:  Sitting up in bed. No resp difficulty HEENT: normal Neck: supple. JVP 9-10. Carotids 2+ bilat; no bruits. No lymphadenopathy or thryomegaly appreciated. Cor: PMI nondisplaced. Regular rate & rhythm. No rubs, gallops or murmurs. Lungs: clear Abdomen: marked central obesity soft, nontender, + distended. No hepatosplenomegaly. No bruits or masses. Good bowel sounds. Extremities: no cyanosis, clubbing, rash, 1+ edema + UNNA boots Neuro: alert & orientedx3, cranial nerves grossly intact. moves all 4 extremities w/o difficulty. Affect pleasant     Telemetry: NSR  60s Personally reviewed   Labs: Basic Metabolic Panel: Recent Labs  Lab 05/05/19 0602 05/06/19 0302 05/07/19 0254 05/08/19 0513 05/09/19 0622  NA 139 139 136 137 138  K 3.7 4.0 3.9 3.3* 3.8  CL 89* 83* 85* 87* 87*  CO2 40* 39* 37* 37* 37*  GLUCOSE 202* 176* 157* 123* 128*  BUN 67* 78* 86* 90* 87*  CREATININE 1.82* 2.37* 2.29* 1.99* 1.65*  CALCIUM 9.4 9.6 9.5 9.4 9.4    Liver Function Tests: No results for input(s): AST, ALT, ALKPHOS, BILITOT, PROT, ALBUMIN in the last 168 hours. No results for input(s): LIPASE,  AMYLASE in the last 168 hours. No results for input(s): AMMONIA in the last 168 hours.  CBC: Recent Labs  Lab 05/06/19 0302 05/07/19 0254 05/08/19 0513 05/09/19 0622  WBC 8.3 9.0 7.9 7.3  HGB 12.8* 12.9* 11.9* 11.8*  HCT 40.0 40.3 37.2* 36.2*  MCV 90.9 90.0 89.9 90.3  PLT 210 220 223 207    Cardiac Enzymes: No results for input(s): CKTOTAL, CKMB, CKMBINDEX, TROPONINI in the last 168 hours.  BNP: BNP (last 3 results) Recent Labs    10/27/18 1013 03/02/19 1157 05/01/19 0123  BNP 38.1 44.4 87.6    ProBNP (last 3 results) No results for input(s): PROBNP in the last 8760 hours.    Other results:  Imaging: No results found.   Medications:     Scheduled Medications:  aspirin EC  81 mg Oral Daily   atorvastatin  20 mg Oral q1800   chlorhexidine  15 mL Mouth Rinse BID   docusate sodium  200 mg Oral QHS   enoxaparin (LOVENOX) injection  60 mg Subcutaneous Q24H   escitalopram  20 mg Oral Daily   fesoterodine  8 mg Oral Daily   gabapentin  400 mg Oral TID   insulin aspart  0-15 Units Subcutaneous TID WC   insulin aspart  0-5 Units Subcutaneous QHS   insulin aspart  12 Units Subcutaneous TID WC   insulin glargine  50 Units Subcutaneous QHS   levothyroxine  175 mcg Oral  Daily   mouth rinse  15 mL Mouth Rinse q12n4p   multivitamin with minerals  1 tablet Oral QHS   potassium chloride SA  20 mEq Oral BID   pramipexole  0.5 mg Oral q morning - 10a   pramipexole  1 mg Oral QPM   Ensure Max Protein  11 oz Oral QHS   sodium chloride flush  3 mL Intravenous Q12H   spironolactone  12.5 mg Oral Daily   tamsulosin  0.4 mg Oral QHS   torsemide  80 mg Oral BID    Infusions:  sodium chloride 250 mL (05/05/19 1528)    PRN Medications: sodium chloride, acetaminophen, albuterol, alum & mag hydroxide-simeth, dextromethorphan-guaiFENesin, Gerhardt's butt cream, hydrALAZINE, loperamide, ondansetron (ZOFRAN) IV, sodium chloride flush,  traZODone   Assessment:   [name] Davisis a 73 y.o.malewith a history of diastolic heart failure, HTN, hypothyroidism, OSA, DMII, 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  Admitted with concern for sepsis due to UTI/BLE cellulitis and volume overload.   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 Reviewed by Dr. Mauricia Area determined to be negative. Myeloma panel with no M-spike, normal IFE. - Echo 5/4 EF 60-65%, normal RV - Back on home torsemide/metolazone - volume status improved. Follow renal function closely. Will get BMET today -Continue spiro 25 daily - Intolerant to Energy with UTI and rash. - Continue UNNA boots.   2. ID -> UTI - Blood cx NGTD. UA+ mod leukocytes. Urine cx with multiple species, suggested recollection - Treated with Zsoyn  3.Chronic Hypoxic Respiratory Failure/COPD - On 3 L O2 qHS at home. Currently on 4 L. Wears BiPAP qHS.   4. Hypokalemia - BMET today  5. Acute on CKD Stage III - Creatinine 1.82 -> 2.37 -> 1.65 due to overdiuresis.  - Follow with restarting diuretics -Monitor closely with diuresis.   6. OSA -ContinueBiPAP qHS - Follows with Dr. Halford Chessman.  7. CAD - Non-obstructive. LHC 08/2015: LM 40, LAD distal 40, LCx ostial 50, RCA proximal 40 -No s/s ischemia. - On ASA/statin - Can stop b-blocker   8. DM - Uncontrolled. A1C is 10.0 - Intolerant to Citrus with UTI and rash. - Per primary  9. +D-dimer - BLE dopplers negative for DVT  10. Deconditioning - Pending CIR placement    Length of Stay: 9   Courtnie Brenes MD 05/10/2019, 10:26 AM  Advanced Heart Failure Team Pager [phone] (M-F; [place])  Please contact [facility] for night-coverage after hours (4p -7a ) and weekends on [email or web address]

## 2019-05-10 NOTE — Progress Notes (Signed)
Pt ambulated from bed to door/hallway with front wheel walker and assist x 2.

## 2019-05-10 NOTE — Progress Notes (Signed)
Inpatient Rehabilitation Admissions Coordinator  I have received a denial from [facility] for inpt rehab . I met with patient at bedside as well as contacted his wife by phone to make them aware. Wife and patient continue to refuse SNF. I contacted Dr. Berle Mull, [facility], as well as RN CM , Hassan Rowan, and they are aware. We will sign off at this time.  Danne Baxter, RN, MSN Rehab Admissions Coordinator [phone] 05/10/2019 11:37 AM

## 2019-05-11 LAB — GLUCOSE, CAPILLARY
Glucose-Capillary: 171 mg/dL — ABNORMAL HIGH (ref 70–99)
Glucose-Capillary: 204 mg/dL — ABNORMAL HIGH (ref 70–99)

## 2019-05-11 LAB — BASIC METABOLIC PANEL
Anion gap: 14 (ref 5–15)
BUN: 86 mg/dL — ABNORMAL HIGH (ref 8–23)
CO2: 35 mmol/L — ABNORMAL HIGH (ref 22–32)
Calcium: 9.4 mg/dL (ref 8.9–10.3)
Chloride: 89 mmol/L — ABNORMAL LOW (ref 98–111)
Creatinine, Ser: 2.07 mg/dL — ABNORMAL HIGH (ref 0.61–1.24)
GFR calc Af Amer: 35 mL/min — ABNORMAL LOW (ref >60)
GFR calc non Af Amer: 31 mL/min — ABNORMAL LOW (ref >60)
Glucose, Bld: 187 mg/dL — ABNORMAL HIGH (ref 70–99)
Potassium: 4.1 mmol/L (ref 3.5–5.1)
Sodium: 138 mmol/L (ref 135–145)

## 2019-05-11 MED ORDER — POTASSIUM CHLORIDE CRYS ER 20 MEQ PO TBCR: 20.0000 meq | EXTENDED_RELEASE_TABLET | Freq: Two times a day (BID) | ORAL | 0 refills | Status: DC

## 2019-05-11 MED ORDER — TORSEMIDE 20 MG PO TABS: 80.0000 mg | ORAL_TABLET | Freq: Two times a day (BID) | ORAL | 0 refills | Status: DC

## 2019-05-11 MED ORDER — ASPIRIN EC 81 MG PO TBEC: 81.0000 mg | DELAYED_RELEASE_TABLET | Freq: Every day | ORAL | 0 refills | Status: AC

## 2019-05-11 MED ORDER — TORSEMIDE 20 MG PO TABS: 80.0000 mg | ORAL_TABLET | Freq: Every day | ORAL | Status: DC

## 2019-05-11 MED ORDER — TORSEMIDE 20 MG PO TABS: 80.0000 mg | ORAL_TABLET | Freq: Two times a day (BID) | ORAL | Status: DC

## 2019-05-11 MED ORDER — HYDRALAZINE HCL 25 MG PO TABS: 25.0000 mg | ORAL_TABLET | Freq: Three times a day (TID) | ORAL | 0 refills | Status: DC

## 2019-05-11 MED FILL — POTASSIUM CL ER 20 MEQ TABL: 20 | 30 days supply | Qty: 60 | Fill #0

## 2019-05-11 MED FILL — hydrALAZINE HCL 25 MG TABS: 25 | 30 days supply | Qty: 90 | Fill #0

## 2019-05-11 NOTE — Discharge Summary (Signed)
Physician Discharge Summary  Travis [name] TKP:[id number] DOB: 1946/06/30 DOA: 04/30/2019  PCP: [name], MD  Admit date: 04/30/2019 Discharge date: 05/11/2019  Admitted From: Home Disposition: Home  Recommendations for Outpatient Follow-up:  1. Follow up with PCP in 1-2 weeks 2. Please obtain BMP/Mag at follow up 3. Please follow up on the following pending results: None  Home Health: PT/RN Equipment/Devices: Patient has a wheelchair and walker at home  Discharge Condition: Stable CODE STATUS: Full code  Hospital Course: Patient is a 73 year old male with hypertension, hyperlipidemia, diabetes, COPD on 2 L nasal cannula at home, hypothyroidism, CHF, OSA on CPAP, dementia, CKD stage III presented with bilateral leg edema, pain, anasarca and shortness of breath.  His legs were also erythematous. In ED, patient was found to have BNP 87.6, negative troponins, stable renal function, chest x-ray showed low volume and vascular congestion without obvious infiltration. Rapid COVID test negative  Patient was treated for acute on chronic diastolic CHF with the guidance of heart failure team/cardiology, and cleared for discharge after adequate diuresis..  Prior to discharge, he was evaluated by physical and Occupational Therapy who recommended SNF but patient declined the recommendation and discharged home with home health.   See individual problem list below for more.   Discharge Diagnoses:  Acute on chronic diastolic CHF (congestive heart failure) (HCC) Acute on chronic hypoxic respiratory failure -Presented with anasarca, SOB, bilateral LE edema -Chest x-ray showed pulmonary vascular congestion -Placed on Lasix drip, metolazone with negative balance of ~14L -Creatinine was trending up hence Lasix drip, metolazone were held. -Resumed home diuretics after improvement in his creatinine -Continue Aldactone 12.5 mg daily. -Maintain good saturation on home to level -Discharged on  torsemide 80 mg twice daily, metolazone 2.5 every Friday, Spironolactone 25 mg daily, KCl 20 twice daily, hydralazine 25 mg 3 times daily, atorvastatin 20 mg daily and aspirin 81 mg per heart failure team recommendation.  Metoprolol stopped at discharge per HF recs -Patient to follow-up with heart failure team -Check electrolytes, fluid status, and adjust medication as appropriate  Lower extremity cellulitis -Blood cultures negative, MRSA screen negative, urine culture with multiple species, was on antibiotics prior to admission for UTI -COVID test negative -Completed doxycycline x 8 days, continue Unna boots  Type II IDDM, uncontrolled with CKD -Discharged on home medications  Acute on CKD stage III -Creatinine peaked at about 2.3 and trending down. -Recheck at follow-up  COPD  -Stable, no wheezing   Depression - cont lexapro, currently stable, no SI, HI   Restless leg syndrome - cont pramipexole   Morbid obesity  BMI 37, recommended diet and weight control  Sacral stage II pressure ulcer, POA Continue wound care  Failure to thrive, generalized debility -Reported falls at home, uses wheelchair/walker at home - PT recommended SNF however patient and wife declined  Discharge Instructions  Discharge Instructions    (HEART FAILURE PATIENTS) Call MD:  Anytime you have any of the following symptoms: 1) 3 pound weight gain in 24 hours or 5 pounds in 1 week 2) shortness of breath, with or without a dry hacking cough 3) swelling in the hands, feet or stomach 4) if you have to sleep on extra pillows at night in order to breathe.   Complete by:  As directed    Call MD for:  difficulty breathing, headache or visual disturbances   Complete by:  As directed    Call MD for:  extreme fatigue   Complete by:  As directed  Call MD for:  persistant dizziness or light-headedness   Complete by:  As directed    Call MD for:  persistant nausea and vomiting   Complete by:  As  directed    Diet - low sodium heart healthy   Complete by:  As directed    Increase activity slowly   Complete by:  As directed      Allergies as of 05/11/2019      Reactions   Amlodipine Swelling, Other (See Comments)   Causes edema (per [facility] paperwork)   Plendil [felodipine] Swelling, Other (See Comments)   Causes edema (per [facility] paperwork)   Compazine [prochlorperazine] Other (See Comments)   Caused agitation (per [facility] paperwork)   Cymbalta [duloxetine Hcl] Other (See Comments)   Made the patient "feel weird," per [facility] paperwork   Jardiance [empagliflozin] Other (See Comments)   Stopped by MD because of unwanted side effects   Other Nausea Only, Other (See Comments)   ANESTHESIA UNSPECIFIED- makes sick, must have anti-nausea medication in advance   Levofloxacin In D5w Itching   Possible infusion reaction 03/29/17. Tolerating oral Levaquin 03/30/17   Septra [sulfamethoxazole-trimethoprim] Nausea Only, Other (See Comments)   Per [facility] paperwork      Medication List    STOP taking these medications   metoprolol tartrate 25 MG tablet Commonly known as:  LOPRESSOR     TAKE these medications   aspirin EC 81 MG tablet Take 1 tablet (81 mg total) by mouth daily for 30 days.   atorvastatin 20 MG tablet Commonly known as:  LIPITOR Take 1 tablet (20 mg total) by mouth daily. What changed:  when to take this   escitalopram 20 MG tablet Commonly known as:  LEXAPRO Take 20 mg by mouth daily.   FreeStyle Libre 14 Day Reader Kerrin Mo 1 kit by Does not apply route 3 (three) times daily.   FreeStyle Libre 14 Day Sensor Misc 1 kit by Does not apply route 3 (three) times daily.   gabapentin 400 MG capsule Commonly known as:  NEURONTIN TAKE 1 CAPSULE (400 MG TOTAL) BY MOUTH EVERY 8 (EIGHT) HOURS. What changed:    how much to take  how to take this  when to take this  additional instructions   glucose blood test  strip Use as instructed to test blood sugars 4 times daily DX E11.42   hydrALAZINE 25 MG tablet Commonly known as:  APRESOLINE Take 1 tablet (25 mg total) by mouth 3 (three) times daily for 30 days. What changed:    medication strength  how much to take   insulin aspart 100 UNIT/ML FlexPen Commonly known as:  NovoLOG FlexPen Inject 18 Units into the skin 3 (three) times daily with meals.   Insulin Glargine 100 UNIT/ML Solostar Pen Commonly known as:  Lantus SoloStar Inject 60 Units into the skin at bedtime.   Insulin Pen Needle 32G X 6 MM Misc Commonly known as:  BD Pen Needle Micro U/F Four times daily   Insulin Pen Needle 32G X 6 MM Misc Commonly known as:  BD Pen Needle Micro U/F 4 times a day   ketoconazole 2 % cream Commonly known as:  NIZORAL Apply 1 application topically daily.   levothyroxine 175 MCG tablet Commonly known as:  SYNTHROID Take 1 tablet (175 mcg total) by mouth daily.   metolazone 2.5 MG tablet Commonly known as:  ZAROXOLYN Take 1 tablet (2.5 mg total) by mouth once a week. Fridays and additional  as directed by heart failure clinic What changed:    when to take this  additional instructions   multivitamin Tabs tablet Take 1 tablet by mouth at bedtime.   onetouch ultrasoft lancets Use as instructed to test blood sugars 4 times daily DX E11.42   OneTouch Verio w/Device Kit 1 kit by Does not apply route 4 (four) times daily. Use as directed to test blood sugars 4 times daily DX E11.42   polyethylene glycol 17 g packet Commonly known as:  MIRALAX / GLYCOLAX Take 17 g by mouth daily as needed. For constipation What changed:    when to take this  additional instructions   potassium chloride SA 20 MEQ tablet Commonly known as:  K-DUR Take 1 tablet (20 mEq total) by mouth 2 (two) times daily. Take 1 tab only on days you take Metolazone What changed:  when to take this   pramipexole 0.5 MG tablet Commonly known as:  MIRAPEX One tablet  in the morning and 2 in the evening What changed:    how much to take  how to take this  when to take this  additional instructions   spironolactone 25 MG tablet Commonly known as:  ALDACTONE Take 1 tablet (25 mg total) by mouth daily.   tamsulosin 0.4 MG Caps capsule Commonly known as:  FLOMAX Take 1 capsule (0.4 mg total) by mouth daily. What changed:  when to take this   torsemide 20 MG tablet Commonly known as:  DEMADEX Take 4 tablets (80 mg total) by mouth 2 (two) times daily.   Toviaz 8 MG Tb24 tablet Generic drug:  fesoterodine Take 1 tablet (8 mg total) by mouth daily.      Follow-up Information    [facility].   Specialty:  [place] Why:  They will continue to do your home health care at your home Contact information: [place] [address] [phone]        [facility] Follow up on 05/18/2019.   Specialty:  Cardiology Why:  Tele-health visit at 12:00 PM, our office will call you at the time of your appointment Contact information: [address] [id number] [place]       [name], MD. Schedule an appointment as soon as possible for a visit in 1 week(s).   Specialty:  Family Medicine Contact information: [address], [place] [address] [phone]        Nahser, Wonda Cheng, MD .   Specialty:  Cardiology Contact information: [place] [place] [address] [phone]           Consultations:  Cardiology/heart failure team  Procedures/Studies:  2D Echo:   1. The left ventricle has normal systolic function, with an ejection fraction of 60-65%. Left ventricular diastolic Doppler parameters are consistent with indeterminate diastolic dysfunction. No evidence of left ventricular regional wall motion  abnormalities.  2. The right ventricle has normal  systolc function. The cavity was normal. There is no increase in right ventricular wall thickness. Right ventricular systolic pressure could not be assessed.  3. Left atrial size was not well visualized.  4. The mitral valve is grossly normal.  5. The aortic valve is tricuspid.  6. The aortic root is normal in size and structure.  7. The inferior vena cava was dilated in size with <50% respiratory variability.  8. The interatrial  septum was not well visualized.  Dg Chest Portable 1 View  Result Date: 05/01/2019 CLINICAL DATA:  Shortness of breath EXAM: PORTABLE CHEST 1 VIEW COMPARISON:  11/29/2018 FINDINGS: Very low lung volumes. Bibasilar atelectasis. Vascular congestion. No visible effusions or acute bony abnormality. IMPRESSION: Very low lung volumes with bibasilar atelectasis and vascular congestion. Electronically Signed   By: Rolm Baptise M.D.   On: 05/01/2019 00:50   Vas [place] Lower Extremity Venous (dvt)  Result Date: 05/02/2019  Lower Venous Study Indications: Cellulitis.  Limitations: Poor ultrasound/tissue interface. Performing Technologist: Oliver Hum RVT  Examination Guidelines: A complete evaluation includes B-mode imaging, spectral Doppler, color Doppler, and power Doppler as needed of all accessible portions of each vessel. Bilateral testing is considered an integral part of a complete examination. Limited examinations for reoccurring indications may be performed as noted.  +---------+---------------+---------+-----------+----------+-------+  RIGHT     Compressibility Phasicity Spontaneity Properties Summary  +---------+---------------+---------+-----------+----------+-------+  CFV       Full            Yes       Yes                             +---------+---------------+---------+-----------+----------+-------+  SFJ       Full                                                      +---------+---------------+---------+-----------+----------+-------+  FV Prox   Full                                                       +---------+---------------+---------+-----------+----------+-------+  FV Mid    Full                                                      +---------+---------------+---------+-----------+----------+-------+  FV Distal Full            Yes       Yes                             +---------+---------------+---------+-----------+----------+-------+  PFV       Full                                                      +---------+---------------+---------+-----------+----------+-------+  POP       Full            Yes       Yes                             +---------+---------------+---------+-----------+----------+-------+  PTV       Full                                                      +---------+---------------+---------+-----------+----------+-------+  PERO      Full                                                      +---------+---------------+---------+-----------+----------+-------+   +---------+---------------+---------+-----------+----------+--------------+  LEFT      Compressibility Phasicity Spontaneity Properties Summary         +---------+---------------+---------+-----------+----------+--------------+  CFV       Full            Yes       Yes                                    +---------+---------------+---------+-----------+----------+--------------+  SFJ       Full                                                             +---------+---------------+---------+-----------+----------+--------------+  FV Prox   Full                                                             +---------+---------------+---------+-----------+----------+--------------+  FV Mid    Full                                                             +---------+---------------+---------+-----------+----------+--------------+  FV Distal Full            Yes       Yes                                    +---------+---------------+---------+-----------+----------+--------------+  PFV       Full                                                              +---------+---------------+---------+-----------+----------+--------------+  POP       Full            Yes       Yes                                    +---------+---------------+---------+-----------+----------+--------------+  PTV       Full                                                             +---------+---------------+---------+-----------+----------+--------------+  PERO                                                       Not visualized  +---------+---------------+---------+-----------+----------+--------------+     Summary: Right: There is no evidence of deep vein thrombosis in the lower extremity. However, portions of this examination were limited- see technologist comments above. No cystic structure found in the popliteal fossa. Left: There is no evidence of deep vein thrombosis in the lower extremity. However, portions of this examination were limited- see technologist comments above. No cystic structure found in the popliteal fossa.  *See table(s) above for measurements and observations. Electronically signed by Harold Barban MD on 05/02/2019 at 11:09:50 AM.    Final       Subjective: No major events overnight of this morning.  No complaints this morning.  Denies chest pain, dyspnea, nausea, vomiting or abdominal pain.  Feels ready to go home.   Discharge Exam: Vitals:   05/11/19 0509 05/11/19 0843  BP: (!) 104/45 (!) 138/52  Pulse: 63   Resp: 18   Temp: 98.2 F (36.8 C)   SpO2: 93%     GENERAL: No acute distress.  Appears well.  HEENT: MMM.  Vision and hearing grossly intact.  NECK: Supple.  No apparent JVD. LUNGS:  No IWOB.  Fair air movement bilaterally.  No crackles. HEART:  RRR.  S1 and S2 audible. ABD: Bowel sounds present. Soft. Non tender.  MSK/EXT:  Moves all extremities. No apparent deformity.  Unna boots over lower extremities SKIN: no apparent skin lesion or wound NEURO: Awake, alert and oriented appropriately.  No  gross deficit.  PSYCH: Calm. Normal affect.     The results of significant diagnostics from this hospitalization (including imaging, microbiology, ancillary and laboratory) are listed below for reference.     Microbiology: Recent Results (from the past 240 hour(s))  MRSA PCR Screening     Status: None   Collection Time: 05/01/19  3:23 PM  Result Value Ref Range Status   MRSA by PCR NEGATIVE NEGATIVE Final    Comment:        The GeneXpert MRSA Assay (FDA approved for NASAL specimens only), is one component of a comprehensive MRSA colonization surveillance program. It is not intended to diagnose MRSA infection nor to guide or monitor treatment for MRSA infections. Performed at [facility], [place] [address] [place], [place] [address]      Labs: BNP (last 3 results) Recent Labs    10/27/18 1013 03/02/19 1157 05/01/19 0123  BNP 38.1 44.4 86.5   Basic Metabolic Panel: Recent Labs  Lab 05/07/19 0254 05/08/19 0513 05/09/19 0622 05/10/19 1119 05/11/19 0448  NA 136 137 138 138 138  K 3.9 3.3* 3.8 3.8 4.1  CL 85* 87* 87* 93* 89*  CO2 37* 37* 37* 36* 35*  GLUCOSE 157* 123* 128* 155* 187*  BUN 86* 90* 87* 83* 86*  CREATININE 2.29* 1.99* 1.65* 1.89* 2.07*  CALCIUM 9.5 9.4 9.4 9.4 9.4   Liver Function Tests: No results for input(s): AST, ALT, ALKPHOS, BILITOT, PROT, ALBUMIN in the last 168 hours. No results for input(s): LIPASE, AMYLASE in the last 168 hours. No results for input(s): AMMONIA in the last 168 hours. CBC: Recent Labs  Lab 05/06/19 0302 05/07/19 0254 05/08/19 0513 05/09/19 0622  WBC 8.3 9.0  7.9 7.3  HGB 12.8* 12.9* 11.9* 11.8*  HCT 40.0 40.3 37.2* 36.2*  MCV 90.9 90.0 89.9 90.3  PLT 210 220 223 207   Cardiac Enzymes: No results for input(s): CKTOTAL, CKMB, CKMBINDEX, TROPONINI in the last 168 hours. BNP: Invalid input(s): POCBNP CBG: Recent Labs  Lab 05/10/19 1155 05/10/19 1640 05/10/19 2105 05/11/19 0612 05/11/19 1130  GLUCAP  138* 184* 129* 171* 204*   D-Dimer No results for input(s): DDIMER in the last 72 hours. Hgb A1c No results for input(s): HGBA1C in the last 72 hours. Lipid Profile No results for input(s): CHOL, HDL, LDLCALC, TRIG, CHOLHDL, LDLDIRECT in the last 72 hours. Thyroid function studies No results for input(s): TSH, T4TOTAL, T3FREE, THYROIDAB in the last 72 hours.  Invalid input(s): FREET3 Anemia work up No results for input(s): VITAMINB12, FOLATE, FERRITIN, TIBC, IRON, RETICCTPCT in the last 72 hours. Urinalysis    Component Value Date/Time   COLORURINE STRAW (A) 05/01/2019 0258   APPEARANCEUR CLEAR 05/01/2019 0258   LABSPEC 1.009 05/01/2019 0258   PHURINE 5.0 05/01/2019 0258   GLUCOSEU 150 (A) 05/01/2019 0258   HGBUR NEGATIVE 05/01/2019 0258   BILIRUBINUR NEGATIVE 05/01/2019 0258   KETONESUR NEGATIVE 05/01/2019 0258   PROTEINUR NEGATIVE 05/01/2019 0258   UROBILINOGEN 0.2 08/22/2015 1820   NITRITE NEGATIVE 05/01/2019 0258   LEUKOCYTESUR MODERATE (A) 05/01/2019 0258   Sepsis Labs Invalid input(s): PROCALCITONIN,  WBC,  LACTICIDVEN   Time coordinating discharge: 40 minutes  SIGNED:  Mercy Riding, MD  [facility] 05/11/2019, 11:52 AM Pager [phone]  If 7PM-7AM, please contact night-coverage [email or web address] Password [email or web address]

## 2019-05-11 NOTE — Progress Notes (Addendum)
Advanced Heart Failure Rounding Note   Subjective:    Back on home dose of torsemide 80 bid. Feels good. No SOB, orthopnea or PND. Remains weak but wants to go home.   Objective:   Weight Range:  Vital Signs:   Temp:  [97.5 F (36.4 C)-98.2 F (36.8 C)] 98.2 F (36.8 C) (05/13 0509) Pulse Rate:  [59-65] 63 (05/13 0509) Resp:  [18-21] 18 (05/13 0509) BP: (104-138)/(45-60) 138/52 (05/13 0843) SpO2:  [89 %-97 %] 93 % (05/13 0509) Weight:  [127.7 kg] 127.7 kg (05/13 0509) Last BM Date: 05/08/19  Weight change: Filed Weights   05/09/19 0325 05/10/19 0502 05/11/19 0509  Weight: 130.3 kg 127.8 kg 127.7 kg    Intake/Output:   Intake/Output Summary (Last 24 hours) at 05/11/2019 0951 Last data filed at 05/11/2019 0804 Gross per 24 hour  Intake 840 ml  Output 2250 ml  Net -1410 ml     Physical Exam: General:  Sitting up in bed. No resp difficulty HEENT: normal Neck: supple.JVP 8-9 Carotids 2+ bilat; no bruits. No lymphadenopathy or thryomegaly appreciated. Cor: PMI nondisplaced. Regular rate & rhythm. No rubs, gallops or murmurs. Lungs: clear Abdomen: obese soft, nontender, nondistended. No hepatosplenomegaly. No bruits or masses. Good bowel sounds. Extremities: no cyanosis, clubbing, rash, tr edema Neuro: alert & orientedx3, cranial nerves grossly intact. moves all 4 extremities w/o difficulty. Affect pleasant      Telemetry: NSR  60-70s Personally reviewed   Labs: Basic Metabolic Panel: Recent Labs  Lab 05/07/19 0254 05/08/19 0513 05/09/19 0622 05/10/19 1119 05/11/19 0448  NA 136 137 138 138 138  K 3.9 3.3* 3.8 3.8 4.1  CL 85* 87* 87* 93* 89*  CO2 37* 37* 37* 36* 35*  GLUCOSE 157* 123* 128* 155* 187*  BUN 86* 90* 87* 83* 86*  CREATININE 2.29* 1.99* 1.65* 1.89* 2.07*  CALCIUM 9.5 9.4 9.4 9.4 9.4    Liver Function Tests: No results for input(s): AST, ALT, ALKPHOS, BILITOT, PROT, ALBUMIN in the last 168 hours. No results for input(s): LIPASE,  AMYLASE in the last 168 hours. No results for input(s): AMMONIA in the last 168 hours.  CBC: Recent Labs  Lab 05/06/19 0302 05/07/19 0254 05/08/19 0513 05/09/19 0622  WBC 8.3 9.0 7.9 7.3  HGB 12.8* 12.9* 11.9* 11.8*  HCT 40.0 40.3 37.2* 36.2*  MCV 90.9 90.0 89.9 90.3  PLT 210 220 223 207    Cardiac Enzymes: No results for input(s): CKTOTAL, CKMB, CKMBINDEX, TROPONINI in the last 168 hours.  BNP: BNP (last 3 results) Recent Labs    10/27/18 1013 03/02/19 1157 05/01/19 0123  BNP 38.1 44.4 87.6    ProBNP (last 3 results) No results for input(s): PROBNP in the last 8760 hours.    Other results:  Imaging: No results found.   Medications:     Scheduled Medications: . aspirin EC  81 mg Oral Daily  . atorvastatin  20 mg Oral q1800  . chlorhexidine  15 mL Mouth Rinse BID  . docusate sodium  200 mg Oral QHS  . enoxaparin (LOVENOX) injection  60 mg Subcutaneous Q24H  . escitalopram  20 mg Oral Daily  . fesoterodine  8 mg Oral Daily  . gabapentin  400 mg Oral TID  . insulin aspart  0-15 Units Subcutaneous TID WC  . insulin aspart  0-5 Units Subcutaneous QHS  . insulin aspart  12 Units Subcutaneous TID WC  . insulin glargine  50 Units Subcutaneous QHS  . levothyroxine  175  mcg Oral Daily  . mouth rinse  15 mL Mouth Rinse q12n4p  . multivitamin with minerals  1 tablet Oral QHS  . potassium chloride SA  20 mEq Oral BID  . pramipexole  0.5 mg Oral q morning - 10a  . pramipexole  1 mg Oral QPM  . Ensure Max Protein  11 oz Oral QHS  . sodium chloride flush  3 mL Intravenous Q12H  . spironolactone  12.5 mg Oral Daily  . tamsulosin  0.4 mg Oral QHS  . torsemide  80 mg Oral BID    Infusions: . sodium chloride 250 mL (05/05/19 1528)    PRN Medications: sodium chloride, acetaminophen, albuterol, alum & mag hydroxide-simeth, dextromethorphan-guaiFENesin, Gerhardt's butt cream, hydrALAZINE, loperamide, ondansetron (ZOFRAN) IV, sodium chloride flush, traZODone    Assessment:   Travis [name] a 73 y.o.malewith a history of diastolic heart failure, HTN, hypothyroidism, OSA, DMII, 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  Admitted with concern for sepsis due to UTI/BLE cellulitis and volume overload.   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 Reviewed by Dr. Mauricia Area determined to be negative. Myeloma panel with no M-spike, normal IFE. - Echo 5/4 EF 60-65%, normal RV - Back on home torsemide/metolazone - volume status improved.  - Creatinine up slightly today but not far from previous. Will continue current dose of diuretics but will need to watch closely as outpatient - particularly if he is compliant with fluid restriction as he says he will be.  -Continue spiro 25 daily - Intolerant to [place] with UTI and rash.   2.Chronic Hypoxic Respiratory Failure/COPD - On 3 L O2 qHS at home. Currently on 4 L. Wears BiPAP qHS.    3. Acute on CKD Stage III - Creatinine 1.82 -> 2.37 -> 1.65 -> 2.07 - plan as above  4. OSA -ContinueBiPAP qHS - Follows with Dr. Halford Chessman.  5. CAD - Non-obstructive. LHC 08/2015: LM 40, LAD distal 40, LCx ostial 50, RCA proximal 40 -No s/s ischemia. - On ASA/statin - Off b-blocker   6. DM - Uncontrolled. A1C is 10.0 - Intolerant to [place] with UTI and rash. - Per primary  7. Deconditioning - discussed with PT. I think he would really benefit from 1 week of SNF if he will accept.  - If not will need HHRN/PT  We will sign off please call with questions.   Home HF meds  Torsemide 80 bid Metolazone  ASA 81 Atorva 20 Kcl 20 bid Spiro 25 hydral 25 tid (decrease from 75)  Stop metoprolol.     Length of Stay: 10   Glori Bickers MD 05/11/2019, 9:51 AM  Advanced Heart Failure Team  Pager [phone] (M-F; 7a - 4p)  Please contact [facility] for night-coverage after hours (4p -7a ) and weekends on [email or web address]

## 2019-05-11 NOTE — Consult Note (Signed)
   [facility] CM Inpatient Consult   05/11/2019  Travis [name] 1946/01/22 [id number]  Follow up:  Chart review reveals patient was denied inpatient rehab admission.  Follow up with inpatient TOC to offer [facility] care management for resource needs via phone.  Spoke with wife, Travis [name], at [phone], [facility] confirmed] to explain [facility] Care Management services for post hospital needs.  Wife expressed she felt patient would do alright with home health set up.  Explained that in the event patient may need additional community resources [facility] care management could assist.  Wife states she feels she does not have any pharmacy needs and she has a 2 daughters nearby to assist.  She was interested in the [facility] social worker for follow up.  Will assign [facility] social worker for resources and check for possible higher level of care needs.  For questions, please contact:  Natividad Brood, RN BSN [facility] Liaison  [phone] business mobile phone Toll free office [phone]  Fax number: [phone] [email or web address]

## 2019-05-11 NOTE — Progress Notes (Signed)
Physical Therapy Treatment Patient Details Name: Travis [name] MRN: [id number] DOB: 08/18/1946 Today's Date: 05/11/2019    History of Present Illness 73 y.o. male admitted with edema, SOB, CHF exacerbation. PMHx: HTN, HLD, DM, COPD on 2L at home, hypothyroidism, depression, CHF, RT BBB, RLS, OSA on CPAP, dementia, CKD stage III    PT Comments    Pt is disappointed that his insurance has denied his request for CIR level rehab. PT had extensive conversation throughout session about how although he has made vast improvements in his strength and mobility he is not safe to discharge home given the amount of assist he requires and his wife's limited ability to provide physical assist. Pt understands PT concerns however his fear of contracting COVID 19 at a SNF outweigh his concern for safety with mobility at home. Pt currently is minA for bed mobility with HoB elevated, requires min guard assist for sit>stand from elevated bed and modAx2 from standard height recliner. Once in standing pt able to ambulate with min guard but requires assist with O2 tank and constant verbal cuing for safety. PT continues to recommend SNF level rehab at discharge. If pt d/c's home he will require 24/7 physical assist to insure safety.    Follow Up Recommendations  SNF     Equipment Recommendations  None recommended by PT       Precautions / Restrictions Precautions Precautions: Fall Restrictions Weight Bearing Restrictions: No    Mobility  Bed Mobility Overal bed mobility: Needs Assistance Bed Mobility: Sit to Supine     Supine to sit: Min assist;HOB elevated     General bed mobility comments: minA for bring trunk to upright from elevated head of bed  Transfers Overall transfer level: Needs assistance Equipment used: Rolling walker (2 wheeled) Transfers: Sit to/from Stand;Lateral/Scoot Transfers Sit to Stand: Min guard;+2 safety/equipment;From elevated surface;+2 physical assistance;Mod assist         Lateral/Scoot Transfers: +2 physical assistance;Mod assist General transfer comment: min guard for safety, pt able to achieve upright without physical assist, however requires increased time and effort in addition to cuing for mechanics of coming to fully upright in RW from elevated bed surface. Pt to demonstrate lateral scoot from drop arm recliner to wc, pt requires modAx2 for power up to clear hips, Pt is unable to come to standing to RW from lower WC surface and requires Stedy and modAx2 for power up   Ambulation/Gait Ambulation/Gait assistance: Min guard;+2 safety/equipment Gait Distance (Feet): 30 Feet Assistive device: Rolling walker (2 wheeled) Gait Pattern/deviations: Step-through pattern;Trunk flexed;Wide base of support;Decreased dorsiflexion - left Gait velocity: slowed Gait velocity interpretation: <1.31 ft/sec, indicative of household ambulator General Gait Details: Pt has improved with his ambulation today, requiring assist for close chair follow and management of O2 tank, pt also requires cues for proximity to RW, upright posture and sequencing with turning. Pt L foot dorsiflexion and foot clearance with swing through decreases with distance and pt is at high risk for tripping over foot with ambulation      Balance Overall balance assessment: Needs assistance Sitting-balance support: Feet supported;No upper extremity supported Sitting balance-Leahy Scale: Fair     Standing balance support: Bilateral upper extremity supported Standing balance-Leahy Scale: Poor Standing balance comment: pt requires maximal support from B UE for static standing                             Cognition Arousal/Alertness: Awake/alert Behavior During Therapy:  WFL for tasks assessed/performed Overall Cognitive Status: Impaired/Different from baseline                               Problem Solving: Requires verbal cues;Requires tactile cues General Comments: Due to  pt's fear of catching COVID19 in SNF pt exhibits poor safety awareness given his continued need for assist with mobility         General Comments General comments (skin integrity, edema, etc.): Extensive education on pt's current deficits and inability for safe mobility without assist. Pt continues to decline SNF due to fear of [place]. PT discussed need for 24 hr physical assist at home due to pt's wife inability to provide adequate assist putting her at increased risk for injury. Pt assure PT that he will be able to hire assistance.       Pertinent Vitals/Pain Pain Assessment: Faces Faces Pain Scale: Hurts little more Pain Location: R side Pain Descriptors / Indicators: Sore Pain Intervention(s): Limited activity within patient's tolerance;Monitored during session;Repositioned           PT Goals (current goals can now be found in the care plan section) Acute Rehab PT Goals PT Goal Formulation: With patient Time For Goal Achievement: 05/16/19 Potential to Achieve Goals: Fair Progress towards PT goals: Progressing toward goals    Frequency    Min 3X/week      PT Plan Discharge plan needs to be updated       AM-PAC PT "6 Clicks" Mobility   Outcome Measure  Help needed turning from your back to your side while in a flat bed without using bedrails?: None Help needed moving from lying on your back to sitting on the side of a flat bed without using bedrails?: A Little Help needed moving to and from a bed to a chair (including a wheelchair)?: A Lot Help needed standing up from a chair using your arms (e.g., wheelchair or bedside chair)?: A Lot Help needed to walk in hospital room?: A Little Help needed climbing 3-5 steps with a railing? : Total 6 Click Score: 15    End of Session Equipment Utilized During Treatment: Gait belt;Oxygen Activity Tolerance: Patient tolerated treatment well Patient left: in chair;with call bell/phone within reach;with chair alarm set Nurse  Communication: Mobility status;Precautions;Need for lift equipment PT Visit Diagnosis: Other abnormalities of gait and mobility (R26.89);Muscle weakness (generalized) (M62.81);Unsteadiness on feet (R26.81);Difficulty in walking, not elsewhere classified (R26.2)     Time: 3428-7681 PT Time Calculation (min) (ACUTE ONLY): 39 min  Charges:  $Gait Training: 8-22 mins $Therapeutic Activity: 23-37 mins                     Alisah Grandberry B. Migdalia Dk PT, DPT Acute Rehabilitation Services Pager [phone] Office [phone]    [place] 05/11/2019, 12:03 PM

## 2019-05-11 NOTE — Progress Notes (Signed)
Nutrition Follow-up  RD working remotely.  DOCUMENTATION CODES:   Obesity unspecified  INTERVENTION:   -Diet education provided on 05/02/19; see RD note documented on that date for further details -Continue MVI with minerals daily -Continue Ensure Max po daily, each supplement provides 150 kcal and 30 grams of protein  NUTRITION DIAGNOSIS:   Increased nutrient needs related to wound healing as evidenced by estimated needs.  Ongoing  GOAL:   Patient will meet greater than or equal to 90% of their needs  Progressing  MONITOR:   PO intake, Supplement acceptance, Labs, Weight trends, Skin, I & O's  REASON FOR ASSESSMENT:   Consult Diet education  ASSESSMENT:   Travis [name] is a 73 y.o. male with medical history significant of hypertension, hyperlipidemia, diabetes mellitus, COPD on 2 L nasal cannula oxygen at home, hypothyroidism, depression, CHF, right bundle blockage, RLS, OSA on CPAP, dementia, CKD stage III, who presents with bilateral leg swelling and pain, shortness of breath.  Reviewed I/O's: -1.3 L x 24 hours and -13.7 L since admission  UOP: 2.3 L x 24 hours  Pt remains with good appetite; noted meal completion 75-100%. Noted multiple refusals of Ensure Max supplement, however, pt took yesterday's dose.   Per MD notes, pt is very deconditioned. Insurance denied CIR admission. Pt refusing SNF. Plan for home with home health once medically stable.  Labs reviewed: CBGS: 129-184 (inpatient orders for glycemic control are 0-15 units insulin aspart TID with meals, 0-5 units insulin aspart q HS, 12 units insulin aspar TID with meals, and 50 units insulin glargine q HS).   Diet Order:   Diet Order            Diet 2 gram sodium Room service appropriate? Yes; Fluid consistency: Thin  Diet effective now              EDUCATION NEEDS:   Education needs have been addressed  Skin:  Skin Assessment: Skin Integrity Issues: Skin Integrity Issues:: Stage II Stage  II: sacrum  Last BM:  05/08/19  Height:   Ht Readings from Last 1 Encounters:  05/01/19 6\' 1"  (1.854 m)    Weight:   Wt Readings from Last 1 Encounters:  05/11/19 127.7 kg    Ideal Body Weight:  83.6 kg  BMI:  Body mass index is 37.14 kg/m.  Estimated Nutritional Needs:   Kcal:  2000-2200  Protein:  105-120 grams  Fluid:  2.0-2.2 L    Alanea Woolridge A. Jimmye Norman, RD, LDN, [place] Registered Dietitian II Certified Diabetes Care and Education Specialist Pager: [phone] After hours Pager: [phone]

## 2019-05-13 ENCOUNTER — Telehealth: Payer: Self-pay

## 2019-05-13 ENCOUNTER — Other Ambulatory Visit: Payer: Self-pay | Admitting: *Deleted

## 2019-05-13 ENCOUNTER — Encounter: Payer: Self-pay | Admitting: *Deleted

## 2019-05-13 ENCOUNTER — Other Ambulatory Visit: Payer: Self-pay | Admitting: Internal Medicine

## 2019-05-13 DIAGNOSIS — I509 Heart failure, unspecified: Secondary | ICD-10-CM

## 2019-05-13 NOTE — Telephone Encounter (Signed)
Patient contacted for Palliative Care visit.  Due to the current COVID-19 infection/crises, the patient and family prefer, and have given their verbal consent for, a provider visit via telemedicine. HIPPA policies of confidentially were discussed and patient/family expressed understanding. Visit scheduled for 05/17/2019

## 2019-05-13 NOTE — Patient Outreach (Signed)
[place] [facility]) Care Management  05/13/2019  Travis [name] [birth year] [id number]   CSW made an initial attempt to try and contact patient today to perform the initial phone assessment, as well as assess and assist with social work needs and services, without success.  A HIPAA compliant message was left for patient on voicemail.  CSW is currently awaiting a return call.  CSW will make a second outreach attempt within the next 3-4 business days, if a return call is not received from patient in the meantime.  CSW will also mail an Outreach Letter to patient's home requesting that patient contact CSW if patient is interested in receiving social work services through [place] with Scientist, clinical (histocompatibility and immunogenetics).  Nat Christen, BSW, MSW, LCSW  Licensed Education officer, environmental Health System  Mailing [place] [address] [place], [place] [address] Physical Address-[address], [place], [place] [address] Toll Free Main # [phone] Fax # [phone] Cell # [phone]  Office # [phone] [email or web address]

## 2019-05-13 NOTE — Patient Outreach (Signed)
[place] [facility]) Care Management  05/13/2019  TORRYN HUDSPETH 02-04-1946 [id number]   CSW received a return call from patient today, in response to the HIPAA compliant message left on voicemail for patient by CSW earlier in the day.  CSW was able to perform the initial phone assessment on patient, as well as assess and assist with social work needs and services.  CSW introduced self, explained role and types of services provided through [place] [facility] ([place] [facility]).  CSW further explained to patient that CSW works with Natividad Brood, [facility], also with [place] [facility]. CSW then explained the reason for the call, indicating that Mrs. Brewer thought that patient would benefit from social work services and resources to assist with possible placement into a higher level of care.  CSW obtained two HIPAA compliant identifiers from patient, which included patient's name and date of birth.  Patient reported that he is "getting along quite well", since being discharged from [facility] on Wednesday, May 11, 2019.  Patient presented to the Emergency Department at [facility] by (EMS) Emergency Medical Services on Saturday, Apr 30, 2019, due to shortness of breath, bilateral leg edema and pain, a distended abdomen and complicated urinary tract infection.  After thorough review of patient's electronic medical record in Epic, [place] noted that patient has a significant medical history, which includes some of the following:  Chronic Diastolic Congestive Heart Failure, Chronic Obstructive Pulmonary Disease, Hypertension, Type 2 Diabetes Mellitus with Stage 3 Chronic Kidney Disease and Restrictive Lung Disease, just to name a few.  A Palliative Care consult has been ordered for patient today by Dr. Lane Hacker, due to the chronicity of patient's Congestive Heart Failure.  Patient indicated that [facility] services were ordered for  him at time of discharge from the hospital, in the form of a nurse and physical therapist.  Patient reported that he needs minimal assistance with activities of daily living, but that his wife and two daughters are able to offer assistance when needed.  Patient assured CSW that he has adequate transportation to get to and from all physician appointments and that he is able to afford to pay for his prescription medications, taking them exactly as prescribed.  Patient denied wanting assistance with arranging placement into an assisted living facility for long-term care services, nor was patient interested in receiving short-term skilled nursing placement for rehabilitative services.  CSW offered to provide patient with a list of in-home care and respite agencies, once [facility] has terminated services, but patient was not interested, indicating that his wife and children will provide for his care.  CSW talked with patient about his history of depression, for which patient reported that he is taking Lexapro 20 MG PO Daily.  Patient denied the need for counseling and supportive services, indicating that he has his symptoms well under control.  CSW will perform a case closure on patient, as all goals of treatment have been met from social work standpoint and no additional social work needs have been identified at this time.  CSW will notify Mrs. Brewer of CSW's plans to close patient's case.  CSW will fax an update to patient's Primary Care Physician, Dr. [name] to ensure that they are aware of CSW's involvement with patient's plan of care.  CSW was able to confirm that patient has the correct contact information for CSW, encouraging patient to contact CSW directly if additional social work needs arise in the near  future.  Patient voiced understanding and was agreeable to this plan.    Nat Christen, BSW, MSW, LCSW  Licensed Education officer, environmental Health  System  Mailing [place] [address] [place], [place] [address] Physical Address-[address], [place], [place] [address] Toll Free Main # [phone] Fax # [phone] Cell # [phone]  Office # [phone] [email or web address]

## 2019-05-17 ENCOUNTER — Other Ambulatory Visit: Payer: PPO | Admitting: Adult Health Nurse Practitioner

## 2019-05-17 ENCOUNTER — Other Ambulatory Visit: Payer: Self-pay

## 2019-05-17 DIAGNOSIS — Z515 Encounter for palliative care: Secondary | ICD-10-CM | POA: Diagnosis not present

## 2019-05-17 NOTE — [facility] Notes (Signed)
[facility] Consult Note Telephone: [phone]  Fax: [phone]  PATIENT NAME: Travis [name] DOB: 05-Oct-1946 MRN: [id number]  PRIMARY CARE PROVIDER:   [name], MD  REFERRING PROVIDER: Dr. Lane Hacker  RESPONSIBLE PARTY:   Self and wife, Donnie Panik: [phone]  C: [phone]  Email: [email or web address]  Due to the COVID-19 crisis, this visit was done via telemedicine and it was initiated and consent by this patient and or family.    RECOMMENDATIONS and PLAN:  1.  CHF.  Patient hospitalized 5/2-5/13/2020 for CHF and cellulitis of bilateral lower extremities.  Patient had gone to ER with 10 pound weight gain and SOB.  Was on BiPaP at hospital.  Today states that he has O2 sat of 90% on RA and is only wearing his O2 @ 2L at night.  He is on torsemide 80 mg daily, metolazone 2.5 mg Q Friday, spironolactone 25 mg daily, hydralazine 25 mg TID.  He also takes potassium chloride 20 mEq BID, atorvastatin 20 mg daily, and aspirin 81 mg daily.  He is currently having his legs wrapped with Unna boots by [facility] RN.  States that he is weak but feels like he is getting stronger since he has been home.  States that he can walk short distances with a walker.  Feels like he is starting to walk longer distances.  Also does some leg exercises while sitting in his chair.  Is receiving HH PT.  Continue current heart medications and PT as order.    2.  Pain.  Does state that he gets burning pain in his legs, especially after walking.  Does have gabapentin 400 mg Q 8 hrs.  May need to increase if having continued complaints of burning pain.  Patient also states have low back pain at times in which he states that applying ice helps.  Is having some pain in his right elbow that is somewhat swollen.  Has history of gout.  Denies trauma.  He is able to lean on his right elbow without considerable pain.  Does have some swelling noted over  telehealth to right elbow but no redness noted.  Believe it may be bursitis related to having fluid overload in the hospital.  Have encouraged him to apply ice for 15-20 minutes at a time three times a day for about 3 days.  I instruct that it may take a few weeks for the bursitis to completely go away and not to be alarmed if it lingers for a while.  3.  Stage II sacral wound.  Patient states that they have been applying desitin to the area.  States that it really isn't getting better but not worse either.  Have encouraged to use pressure relieving cushion while sitting in chair.  States that he has one but it slips out of his chair.  Encouraged him to ask PT if they have any suggestion for pressure relieving devices that might fit his chair.  Patient has motorized scooter chair.    4.  Stomach pain.  Patient states that he has been having a lot of stomach aching and indigestion since he has been out of the hospital.  States that he has tried Tums with no relief.  States that it can make him feel nauseous.  States that his stomach will ache first and then the indigestion starts.  Worse in the evening after supper around 5-8 pm.  Have recommended that he take it around 4pm before dinner  since his symptoms are worse in the evening.  Though talking with him this morning he was having indigestion.  This may be temporary from all the antibiotics he had in the hospital and suggested trying the prilosec for a week or two and see if his symptoms go away.  May need further workup with PCP if symptoms do not go away.  5.  Depression.  Patient is currently on lexapro 20 mg daily.  Does state that being in the hospital and since being back home his depression has been worse.  He states that he just feels like he is not going to get over this.  He is at the maximum dose of lexapro but could add something like celexa or zoloft for a few months to get him over this time in his life when he trying to regain his strength.     6.  Goals of care.  Patient is full code.  Introduced topic of ACP.  Sent information to patient on advance directives and a copy of the MOST form.  Also sent information sheet on palliative care.  Will discuss at next meeting on 05/24/2019    I spent 60 minutes providing this consultation,  from 10:00 to 11:00. More than 50% of the time in this consultation was spent coordinating communication.   HISTORY OF PRESENT ILLNESS:  Travis [name] is a 73 y.o. year old male with multiple medical problems including CHF, HTN, COPD, DMT2, lumbar spinal stenosis. Palliative Care was asked to help address goals of care.   CODE STATUS: Full Code  PPS: 50% HOSPICE ELIGIBILITY/DIAGNOSIS: TBD  PHYSICAL EXAM:   General: sitting in wheelchair in NAD but is fidgety as if uncomfortable Extremities: Unna boots in place to bilateral lower legs.  Has some swelling noted to right elbow.  Does not appear to have any reddness Neurological: Weakness but otherwise nonfocal  PAST MEDICAL HISTORY:  Past Medical History:  Diagnosis Date  . Cervical myelopathy (HCC)    with myelomalacia at C5-6 and C3-4  . CHF (congestive heart failure) ([place])   . Constipation - functional    controlled with miralax  . COPD (chronic obstructive pulmonary disease) (HCC)    mild  . DJD (degenerative joint disease)    WHOLE SPINE  . Dyspnea   . Gait disorder   . Heart murmur    years ago   . Hypertension   . Hypothyroidism    Low d/t treatment of hyperthyroidism  . Lumbosacral spinal stenosis 03/13/2014  . On home oxygen therapy    "2.5L all the time" (06/29/2018)  . Peripheral neuropathy   . Pneumonia   . PONV (postoperative nausea and vomiting)   . RBBB   . Renal disorder    acute kidney failure  . Restless legs syndrome 03/14/2015  . RLS (restless legs syndrome)   . Sleep apnea    DX  2009/2010 study done at [facility]  . 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 Type II diabetes mellitus ([place])    diet controlled     SOCIAL HX:  Social History   Tobacco Use  . Smoking status: Former Smoker    Packs/day: 2.00    Years: 20.00    Pack years: 40.00    Types: Cigarettes    Last attempt to quit: 06/28/1985    Years since quitting: 33.9  . Smokeless tobacco: Former Systems developer    Quit date: 06/28/1985  Substance Use Topics  . Alcohol use: No  ALLERGIES:  Allergies  Allergen Reactions  . Amlodipine Swelling and Other (See Comments)    Causes edema (per [facility] paperwork)  . Plendil [Felodipine] Swelling and Other (See Comments)    Causes edema (per [facility] paperwork)  . Compazine [Prochlorperazine] Other (See Comments)    Caused agitation (per [facility] paperwork)  . Cymbalta [Duloxetine Hcl] Other (See Comments)    Made the patient "feel weird," per [facility] paperwork  . Jardiance [Empagliflozin] Other (See Comments)    Stopped by MD because of unwanted side effects  . Other Nausea Only and Other (See Comments)    ANESTHESIA UNSPECIFIED- makes sick, must have anti-nausea medication in advance  . Levofloxacin In D5w Itching    Possible infusion reaction 03/29/17. Tolerating oral Levaquin 03/30/17  . Septra [Sulfamethoxazole-Trimethoprim] Nausea Only and Other (See Comments)    Per [facility] paperwork     PERTINENT MEDICATIONS:  Outpatient Encounter Medications as of 05/17/2019  Medication Sig  . aspirin EC 81 MG tablet Take 1 tablet (81 mg total) by mouth daily for 30 days.  [name] atorvastatin (LIPITOR) 20 MG tablet Take 1 tablet (20 mg total) by mouth daily. (Patient taking differently: Take 20 mg by mouth daily at 6 PM. )  . Blood Glucose Monitoring Suppl (ONETOUCH VERIO) w/Device KIT 1 kit by Does not apply route 4 (four) times daily. Use as directed to test blood sugars 4 times daily DX E11.42  . Continuous Blood Gluc Receiver (FREESTYLE LIBRE 14 DAY READER) DEVI 1 kit by Does not apply route 3 (three) times daily.  . Continuous Blood Gluc Sensor (FREESTYLE LIBRE 14  DAY SENSOR) MISC 1 kit by Does not apply route 3 (three) times daily.  [name] escitalopram (LEXAPRO) 20 MG tablet Take 20 mg by mouth daily.  [name] gabapentin (NEURONTIN) 400 MG capsule TAKE 1 CAPSULE (400 MG TOTAL) BY MOUTH EVERY 8 (EIGHT) HOURS. (Patient taking differently: Take 400 mg by mouth 3 (three) times daily. )  . glucose blood test strip Use as instructed to test blood sugars 4 times daily DX E11.42  . hydrALAZINE (APRESOLINE) 25 MG tablet Take 1 tablet (25 mg total) by mouth 3 (three) times daily for 30 days.  . insulin aspart (NOVOLOG FLEXPEN) 100 UNIT/ML FlexPen Inject 18 Units into the skin 3 (three) times daily with meals.  . Insulin Glargine (LANTUS SOLOSTAR) 100 UNIT/ML Solostar Pen Inject 60 Units into the skin at bedtime.  . Insulin Pen Needle (BD PEN NEEDLE MICRO U/F) 32G X 6 MM MISC Four times daily  . Insulin Pen Needle (BD PEN NEEDLE MICRO U/F) 32G X 6 MM MISC 4 times a day  . ketoconazole (NIZORAL) 2 % cream Apply 1 application topically daily.  . Lancets (ONETOUCH ULTRASOFT) lancets Use as instructed to test blood sugars 4 times daily DX E11.42  . levothyroxine (SYNTHROID, LEVOTHROID) 175 MCG tablet Take 1 tablet (175 mcg total) by mouth daily.  . metolazone (ZAROXOLYN) 2.5 MG tablet Take 1 tablet (2.5 mg total) by mouth once a week. Fridays and additional as directed by heart failure clinic (Patient taking differently: Take 2.5 mg by mouth every Saturday. )  . multivitamin (ONE-A-DAY MEN'S) TABS tablet Take 1 tablet by mouth at bedtime.  . polyethylene glycol (MIRALAX / GLYCOLAX) packet Take 17 g by mouth daily as needed. For constipation (Patient taking differently: Take 17 g by mouth at bedtime. )  . potassium chloride SA (K-DUR) 20 MEQ tablet Take 1 tablet (20 mEq total) by  mouth 2 (two) times daily. Take 1 tab only on days you take Metolazone  . pramipexole (MIRAPEX) 0.5 MG tablet One tablet in the morning and 2 in the evening (Patient taking differently: Take 0.5-1 mg by mouth  See admin instructions. Take 1 tablet in the morning and take 1 or 2 tablets every evening)  . spironolactone (ALDACTONE) 25 MG tablet Take 1 tablet (25 mg total) by mouth daily.  . tamsulosin (FLOMAX) 0.4 MG CAPS capsule Take 1 capsule (0.4 mg total) by mouth daily. (Patient taking differently: Take 0.4 mg by mouth at bedtime. )  . torsemide (DEMADEX) 20 MG tablet Take 4 tablets (80 mg total) by mouth 2 (two) times daily.  . TOVIAZ 8 MG TB24 tablet Take 1 tablet (8 mg total) by mouth daily.   No facility-administered encounter medications on file as of 05/17/2019.       Amanpreet Delmont Jenetta Downer, NP

## 2019-05-18 ENCOUNTER — Telehealth (HOSPITAL_COMMUNITY): Payer: Self-pay

## 2019-05-18 ENCOUNTER — Other Ambulatory Visit: Payer: Self-pay

## 2019-05-18 ENCOUNTER — Ambulatory Visit (HOSPITAL_COMMUNITY)
Admit: 2019-05-18 | Discharge: 2019-05-18 | Disposition: A | Discharge status: Home / Self Care | Payer: PPO | Attending: Cardiology | Admitting: Cardiology

## 2019-05-18 DIAGNOSIS — I700 Atherosclerosis of aorta: Secondary | ICD-10-CM | POA: Diagnosis not present

## 2019-05-18 DIAGNOSIS — I5032 Chronic diastolic (congestive) heart failure: Secondary | ICD-10-CM

## 2019-05-18 DIAGNOSIS — N183 Chronic kidney disease, stage 3 unspecified: Secondary | ICD-10-CM

## 2019-05-18 DIAGNOSIS — I129 Hypertensive chronic kidney disease with stage 1 through stage 4 chronic kidney disease, or unspecified chronic kidney disease: Secondary | ICD-10-CM | POA: Diagnosis not present

## 2019-05-18 DIAGNOSIS — E1129 Type 2 diabetes mellitus with other diabetic kidney complication: Secondary | ICD-10-CM | POA: Diagnosis not present

## 2019-05-18 DIAGNOSIS — F411 Generalized anxiety disorder: Secondary | ICD-10-CM | POA: Diagnosis not present

## 2019-05-18 DIAGNOSIS — G629 Polyneuropathy, unspecified: Secondary | ICD-10-CM | POA: Diagnosis not present

## 2019-05-18 DIAGNOSIS — G4730 Sleep apnea, unspecified: Secondary | ICD-10-CM | POA: Diagnosis not present

## 2019-05-18 DIAGNOSIS — L89159 Pressure ulcer of sacral region, unspecified stage: Secondary | ICD-10-CM | POA: Diagnosis not present

## 2019-05-18 DIAGNOSIS — G4733 Obstructive sleep apnea (adult) (pediatric): Secondary | ICD-10-CM

## 2019-05-18 DIAGNOSIS — N390 Urinary tract infection, site not specified: Secondary | ICD-10-CM | POA: Diagnosis not present

## 2019-05-18 DIAGNOSIS — I509 Heart failure, unspecified: Secondary | ICD-10-CM | POA: Diagnosis not present

## 2019-05-18 DIAGNOSIS — I5033 Acute on chronic diastolic (congestive) heart failure: Secondary | ICD-10-CM | POA: Diagnosis not present

## 2019-05-18 DIAGNOSIS — G2581 Restless legs syndrome: Secondary | ICD-10-CM | POA: Diagnosis not present

## 2019-05-18 DIAGNOSIS — E890 Postprocedural hypothyroidism: Secondary | ICD-10-CM | POA: Diagnosis not present

## 2019-05-18 DIAGNOSIS — J9611 Chronic respiratory failure with hypoxia: Secondary | ICD-10-CM

## 2019-05-18 MED ORDER — TORSEMIDE 20 MG PO TABS: 80.0000 mg | ORAL_TABLET | Freq: Two times a day (BID) | ORAL | 0 refills | Status: DC

## 2019-05-18 NOTE — Addendum Note (Signed)
Encounter addended by: Jovita Kussmaul, RN on: 05/18/2019 3:00 PM  Actions taken: Pharmacy for encounter modified, Order list changed

## 2019-05-18 NOTE — Progress Notes (Signed)
Heart Failure TeleHealth Note  Due to national recommendations of social distancing due to [place] 19, telehealth visit is felt to be most appropriate for this patient at this time.  I discussed the limitations, risks, security and privacy concerns of performing an evaluation and management service by telephone and the availability of in person appointments. I also discussed with the patient that there may be a patient responsible charge related to this service. The patient expressed understanding and agreed to proceed.   ID:  Travis [name], Travis [name] 12-13-1946, MRN [id number]  Location: Home  Provider location: [address] [place] Type of Visit: Established patient   PCP:  [name], MD  Cardiologist:  Mertie Moores, MD Primary HF: Dr [name]  Chief Complaint: Hospital follow up   History of Present Illness: Travis [name] is a 73 y.o. 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  Admitted 5/2-5/13/20 with volume overload. Diuresed with lasix drip and metolazone. Had AKI that improved with switching back to oral diuretics. Also treated for cellulitis with doxy. PT recommended SNF, but he refused. DC weight 281 lbs. He was discharged on torsemide 80 mg daily with 2.5 mg metolazone weekly. Discharged with [facility] HHRN/HHPT.   Patient presents via audio conferencing for a telehealth visit today.    Followed by [facility] with vest protocol. Overall complaining of fatigue.  Remains SOB with exertion. Last night he was very short of breath moving in the house and worsened when he went to bed.  Uses 3 liters oxygen and Bipap.   Denies PND/Orthopnea. R and LLE wrapped with unna boots.  Appetite ok. No fever or chills. Unable to stand on a scale due to balance. Taking all medications. His wife manages medications.   Pt denies symptoms of cough, fevers, chills, or new SOB  worrisome for COVID 19.    Past Medical History:  Diagnosis Date   Cervical myelopathy (HCC)    with myelomalacia at C5-6 and C3-4   CHF (congestive heart failure) (HCC)    Constipation - functional    controlled with miralax   COPD (chronic obstructive pulmonary disease) (HCC)    mild   DJD (degenerative joint disease)    WHOLE SPINE   Dyspnea    Gait disorder    Heart murmur    years ago    Hypertension    Hypothyroidism    Low d/t treatment of hyperthyroidism   Lumbosacral spinal stenosis 03/13/2014   On home oxygen therapy    "2.5L all the time" (06/29/2018)   Peripheral neuropathy    Pneumonia    PONV (postoperative nausea and vomiting)    RBBB    Renal disorder    acute kidney failure   Restless legs syndrome 03/14/2015   RLS (restless legs syndrome)    Sleep apnea    DX  2009/2010 study done at [facility]   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   Type II diabetes mellitus ([place])    diet controlled   Past Surgical History:  Procedure Laterality Date   BACK SURGERY     6 surgeries   CARDIAC CATHETERIZATION [name] 09/07/2015   Procedure: Right/Left Heart Cath and Coronary Angiography;  Surgeon: Peter M Martinique, MD;  Location: [facility];  Service: Cardiovascular;  Laterality: [name];   CHOLECYSTECTOMY     EYE SURGERY     BIL CATARACTS  LUMBAR LAMINECTOMY/DECOMPRESSION MICRODISCECTOMY  07/14/2012   Procedure: LUMBAR LAMINECTOMY/DECOMPRESSION MICRODISCECTOMY 2 LEVELS;  Surgeon: Eustace Moore, MD;  Location: [facility];  Service: Neurosurgery;  Laterality: Left;  Lumbar two three laminectomy, left thoracic four five transpedicular diskectomy   POSTERIOR CERVICAL FUSION/FORAMINOTOMY  04/08/2018   Procedure: Laminectomy and Foraminotomy - Lumbar Four-Lumbar Five, instrumented posterolateral fusion Lumbar Four- Five, Laminectomy and Foraminotomy - Thoracic One-Thoracic Two;  Surgeon: Eustace Moore, MD;  Location: [facility];  Service: Neurosurgery;;   posterior   POSTERIOR LUMBAR FUSION  04/08/2018   Laminectomy and Foraminotomy - Lumbar Four-Lumbar Five, instrumented posterolateral fusion Lumbar Four- Five, Laminectomy and Foraminotomy - Thoracic One-Thoracic Two   POSTERIOR LUMBAR FUSION  04/08/2018   Laminectomy and Foraminotomy - Lumbar Four-Lumbar Five, instrumented posterolateral fusion Lumbar Four- Five, Laminectomy and Foraminotomy - Thoracic One-Thoracic Two     Current Outpatient Medications  Medication Sig Dispense Refill   aspirin EC 81 MG tablet Take 1 tablet (81 mg total) by mouth daily for 30 days. 30 tablet 0   atorvastatin (LIPITOR) 20 MG tablet Take 1 tablet (20 mg total) by mouth daily. (Patient taking differently: Take 20 mg by mouth daily at 6 PM. ) 90 tablet 3   Blood Glucose Monitoring Suppl (ONETOUCH VERIO) w/Device KIT 1 kit by Does not apply route 4 (four) times daily. Use as directed to test blood sugars 4 times daily DX E11.42 1 kit 0   Continuous Blood Gluc Receiver (FREESTYLE LIBRE 14 DAY READER) DEVI 1 kit by Does not apply route 3 (three) times daily. 1 Device 0   Continuous Blood Gluc Sensor (FREESTYLE LIBRE 14 DAY SENSOR) MISC 1 kit by Does not apply route 3 (three) times daily. 3 each 11   escitalopram (LEXAPRO) 20 MG tablet Take 20 mg by mouth daily.     gabapentin (NEURONTIN) 400 MG capsule TAKE 1 CAPSULE (400 MG TOTAL) BY MOUTH EVERY 8 (EIGHT) HOURS. (Patient taking differently: Take 400 mg by mouth 3 (three) times daily. ) 90 capsule 0   glucose blood test strip Use as instructed to test blood sugars 4 times daily DX E11.42 100 each 12   hydrALAZINE (APRESOLINE) 25 MG tablet Take 1 tablet (25 mg total) by mouth 3 (three) times daily for 30 days. 90 tablet 0   insulin aspart (NOVOLOG FLEXPEN) 100 UNIT/ML FlexPen Inject 18 Units into the skin 3 (three) times daily with meals. 15 mL 11   Insulin Glargine (LANTUS SOLOSTAR) 100 UNIT/ML Solostar Pen Inject 60 Units into the skin at bedtime. 15 mL  11   Insulin Pen Needle (BD PEN NEEDLE MICRO U/F) 32G X 6 MM MISC Four times daily 150 each 6   Insulin Pen Needle (BD PEN NEEDLE MICRO U/F) 32G X 6 MM MISC 4 times a day 150 each 11   ketoconazole (NIZORAL) 2 % cream Apply 1 application topically daily. 120 g 0   Lancets (ONETOUCH ULTRASOFT) lancets Use as instructed to test blood sugars 4 times daily DX E11.42 100 each 12   levothyroxine (SYNTHROID, LEVOTHROID) 175 MCG tablet Take 1 tablet (175 mcg total) by mouth daily. 30 tablet 2   metolazone (ZAROXOLYN) 2.5 MG tablet Take 1 tablet (2.5 mg total) by mouth once a week. Fridays and additional as directed by heart failure clinic (Patient taking differently: Take 2.5 mg by mouth once a week. ) 10 tablet 6   potassium chloride SA (K-DUR) 20 MEQ tablet Take 1 tablet (20 mEq total) by mouth 2 (  two) times daily. Take 1 tab only on days you take Metolazone (Patient taking differently: Take 20 mEq by mouth 2 (two) times daily. Take extra tablet when  you take Metolazone) 60 tablet 0   pramipexole (MIRAPEX) 0.5 MG tablet One tablet in the morning and 2 in the evening (Patient taking differently: Take 0.5-1 mg by mouth See admin instructions. Take 1 tablet in the morning and take 1 or 2 tablets every evening) 270 tablet 1   spironolactone (ALDACTONE) 25 MG tablet Take 1 tablet (25 mg total) by mouth daily. 90 tablet 3   tamsulosin (FLOMAX) 0.4 MG CAPS capsule Take 1 capsule (0.4 mg total) by mouth daily. (Patient taking differently: Take 0.4 mg by mouth at bedtime. ) 30 capsule 0   torsemide (DEMADEX) 20 MG tablet Take 4 tablets (80 mg total) by mouth 2 (two) times daily. (Patient taking differently: Take 80 mg by mouth daily. ) 240 tablet 0   TOVIAZ 8 MG TB24 tablet Take 1 tablet (8 mg total) by mouth daily. 30 tablet 11   multivitamin (ONE-A-DAY MEN'S) TABS tablet Take 1 tablet by mouth at bedtime.     polyethylene glycol (MIRALAX / GLYCOLAX) packet Take 17 g by mouth daily as needed. For  constipation (Patient taking differently: Take 17 g by mouth at bedtime. ) 14 each 0   No current facility-administered medications for this encounter.     Allergies:   Amlodipine; Plendil [felodipine]; Compazine [prochlorperazine]; Cymbalta [duloxetine hcl]; Jardiance [empagliflozin]; Other; Levofloxacin in d5w; and Septra [sulfamethoxazole-trimethoprim]   Social History:  The patient  reports that he quit smoking about 33 years ago. His smoking use included cigarettes. He has a 40.00 pack-year smoking history. He quit smokeless tobacco use about 33 years ago. He reports that he does not drink alcohol or use drugs.   Family History:  The patient's family history includes CAD in an other family member; Diabetes in his father; Heart disease in his father; Hypertension in his father; Polycythemia in his mother.   ROS:  Please see the history of present illness.   All other systems are personally reviewed and negative.    Exam:  Tele Health Call; Exam is audio  General:  Speaks in full sentences. No resp difficulty. Lungs: Normal respiratory effort with conversation.  Abdomen: No distension per patient report Extremities: R and LLE unna boots with edema above the wraps.  Neuro: Alert & oriented x 3.   Recent Labs: 06/29/2018: TSH 1.171 04/30/2019: ALT 16 05/01/2019: B Natriuretic Peptide 87.6 05/02/2019: Magnesium 2.4 05/09/2019: Hemoglobin 11.8; Platelets 207 05/11/2019: BUN 86; Creatinine, Ser 2.07; Potassium 4.1; Sodium 138  Personally reviewed   Wt Readings from Last 3 Encounters:  05/11/19 127.7 kg  04/15/19 129.7 kg  03/04/19 126.1 kg      ASSESSMENT AND PLAN:  1.Chronic Diastolic Heart Failure with R>>L symptoms - ECHO 06/29/2018 EF 55-60% Grade IDD  - Suspect due to R-sided HF from OSA, OHS and COPD - PYP scan 10/2018 with Grade 1, H/CLL 1.45. Reviewed by Dr. Mauricia Area determined to be negative.Myeloma panel with no M-spike, normal IFE. - Echo 5/4 EF 60-65%, normal RV -  NYHA III. Volume status trending up. Increase torsemide back up to 80 mg twice a day.  - Continue metolazone once/week. Check BMET. -Continue spiro 25 daily - Intolerant to [place] with UTI and rash.  2.Chronic Hypoxic Respiratory Failure/COPD - Continue BiPAP qHS with 3 L O2.   3. CKD Stage III - Check BMET tomorrow by  HH   4. OSA - Continue Bipap nightly.  5. CAD - Non-obstructive. LHC 08/2015: LM 40, LAD distal 40, LCx ostial 50, RCA proximal 40 - No chest pain.  - On ASA/statin - Off b-blocker   6. DM - Uncontrolled. A1C is 10.0 -Intolerant to [place] with UTI and rash. - Per PCP.  7.Deconditioning - Followed by [facility] - PT  8. RLE/LLE  Continue unna boots. Needs at least weekly changes. f   COVID screen The patient does not have any symptoms that suggest any further testing/ screening at this time.  Social distancing reinforced today.  Patient Risk: After full review of this patients clinical status, I feel that they are at moderate risk for cardiac decompensation at this time.  Orders/Follow up: Follow in 7 days for virtual visit.  Increase torsemide to 80 mg twice a day . We will contact [facility] for a visit tomorrow. Needs [place] protocol and BMET tomorrow.   Today, I have spent 24  minutes with the patient with telehealth technology discussing the above.    Jeanmarie Hubert, NP  05/18/2019 2:42 PM   [facility] [address] Heart and [place] [address] [phone] (office) [phone] (fax)

## 2019-05-18 NOTE — Telephone Encounter (Signed)
Reviewed AVS:  Follow in 7 days for virtual visit. /scheduled 6/2  Increase torsemide to 80 mg twice a day . /script sent  Ask [facility] to place [employer] and to check BMET tomorrow/ v/o given to [facility]

## 2019-05-19 ENCOUNTER — Telehealth (HOSPITAL_COMMUNITY): Payer: Self-pay

## 2019-05-19 NOTE — Telephone Encounter (Signed)
  Thanks  Amy Clegg NP_C  10:23 AM

## 2019-05-19 NOTE — Telephone Encounter (Signed)
Received call from [employer] from [facility]. They are unable to get a ReDS vest reading on him because of his BMI. They will be getting labs on him today though as ordered.

## 2019-05-20 DIAGNOSIS — I5033 Acute on chronic diastolic (congestive) heart failure: Secondary | ICD-10-CM | POA: Diagnosis not present

## 2019-05-20 DIAGNOSIS — G9340 Encephalopathy, unspecified: Secondary | ICD-10-CM | POA: Diagnosis not present

## 2019-05-20 DIAGNOSIS — M4800 Spinal stenosis, site unspecified: Secondary | ICD-10-CM | POA: Diagnosis not present

## 2019-05-21 DIAGNOSIS — L89152 Pressure ulcer of sacral region, stage 2: Secondary | ICD-10-CM | POA: Diagnosis not present

## 2019-05-21 DIAGNOSIS — I4510 Unspecified right bundle-branch block: Secondary | ICD-10-CM | POA: Diagnosis not present

## 2019-05-21 DIAGNOSIS — E1142 Type 2 diabetes mellitus with diabetic polyneuropathy: Secondary | ICD-10-CM | POA: Diagnosis not present

## 2019-05-21 DIAGNOSIS — M4712 Other spondylosis with myelopathy, cervical region: Secondary | ICD-10-CM | POA: Diagnosis not present

## 2019-05-21 DIAGNOSIS — Z9981 Dependence on supplemental oxygen: Secondary | ICD-10-CM | POA: Diagnosis not present

## 2019-05-21 DIAGNOSIS — S81812D Laceration without foreign body, left lower leg, subsequent encounter: Secondary | ICD-10-CM | POA: Diagnosis not present

## 2019-05-21 DIAGNOSIS — G2581 Restless legs syndrome: Secondary | ICD-10-CM | POA: Diagnosis not present

## 2019-05-21 DIAGNOSIS — M4807 Spinal stenosis, lumbosacral region: Secondary | ICD-10-CM | POA: Diagnosis not present

## 2019-05-21 DIAGNOSIS — I5033 Acute on chronic diastolic (congestive) heart failure: Secondary | ICD-10-CM | POA: Diagnosis not present

## 2019-05-21 DIAGNOSIS — I2510 Atherosclerotic heart disease of native coronary artery without angina pectoris: Secondary | ICD-10-CM | POA: Diagnosis not present

## 2019-05-21 DIAGNOSIS — E039 Hypothyroidism, unspecified: Secondary | ICD-10-CM | POA: Diagnosis not present

## 2019-05-21 DIAGNOSIS — G4733 Obstructive sleep apnea (adult) (pediatric): Secondary | ICD-10-CM | POA: Diagnosis not present

## 2019-05-21 DIAGNOSIS — M519 Unspecified thoracic, thoracolumbar and lumbosacral intervertebral disc disorder: Secondary | ICD-10-CM | POA: Diagnosis not present

## 2019-05-21 DIAGNOSIS — N401 Enlarged prostate with lower urinary tract symptoms: Secondary | ICD-10-CM | POA: Diagnosis not present

## 2019-05-21 DIAGNOSIS — J449 Chronic obstructive pulmonary disease, unspecified: Secondary | ICD-10-CM | POA: Diagnosis not present

## 2019-05-21 DIAGNOSIS — F329 Major depressive disorder, single episode, unspecified: Secondary | ICD-10-CM | POA: Diagnosis not present

## 2019-05-21 DIAGNOSIS — R338 Other retention of urine: Secondary | ICD-10-CM | POA: Diagnosis not present

## 2019-05-21 DIAGNOSIS — E1122 Type 2 diabetes mellitus with diabetic chronic kidney disease: Secondary | ICD-10-CM | POA: Diagnosis not present

## 2019-05-21 DIAGNOSIS — I130 Hypertensive heart and chronic kidney disease with heart failure and stage 1 through stage 4 chronic kidney disease, or unspecified chronic kidney disease: Secondary | ICD-10-CM | POA: Diagnosis not present

## 2019-05-21 DIAGNOSIS — Z87891 Personal history of nicotine dependence: Secondary | ICD-10-CM | POA: Diagnosis not present

## 2019-05-21 DIAGNOSIS — N183 Chronic kidney disease, stage 3 (moderate): Secondary | ICD-10-CM | POA: Diagnosis not present

## 2019-05-21 DIAGNOSIS — M50021 Cervical disc disorder at C4-C5 level with myelopathy: Secondary | ICD-10-CM | POA: Diagnosis not present

## 2019-05-21 DIAGNOSIS — Z794 Long term (current) use of insulin: Secondary | ICD-10-CM | POA: Diagnosis not present

## 2019-05-24 ENCOUNTER — Other Ambulatory Visit: Payer: Self-pay

## 2019-05-24 ENCOUNTER — Telehealth: Payer: Self-pay | Admitting: Internal Medicine

## 2019-05-24 ENCOUNTER — Other Ambulatory Visit: Payer: PPO | Admitting: Adult Health Nurse Practitioner

## 2019-05-24 DIAGNOSIS — Z515 Encounter for palliative care: Secondary | ICD-10-CM | POA: Diagnosis not present

## 2019-05-24 NOTE — Telephone Encounter (Signed)
Cardiologist reached out to [place] in regards to having a virtual visit with the patient. States that the patient and his wife stated his blood sugars have been running really low in the mornings and also patient is having problems trying to get Novolog. Informed the office they have not reported any of these issues to [place] but I will be informing the nurse to reach out.   Please Advise, Thanks

## 2019-05-24 NOTE — Progress Notes (Signed)
[place] Consult Note Telephone: [phone]  Fax: [phone]  PATIENT NAME: Travis [name] DOB: 1946/01/27 MRN: [id number]  PRIMARY CARE PROVIDER:   [name], MD  REFERRING PROVIDER: Dr. Lane [name]  RESPONSIBLE PARTY:  Self and wife, Travis [name]: [phone]  C: [phone]  Email: [email or web address]   Due to the COVID-19 crisis, this visit was done via telemedicine and it was initiated and consent by this patient and or family. Video-audio (telehealth) contact was unable to be done due to technical barriers from the patient's side.  Had scheduled Zoom visit but was not able to connect.    RECOMMENDATIONS and PLAN:  1.  CHF.  Patient had a televisit with Travis Grinder NP at [facility] last week.  His torsemide was increased to 83m BID then and his unna boots were to continue.  Patient [facility] Sunday and took off his unna boots due to increased burning in his legs.  Wife states that the RN put on a pair of socks but they were not like compression socks.  Patient does state that he does not think he would be able to apply TED hose on his own and the RN had suggested a pair with velcro for easier use.  Patient also stated that he feels like he is getting fluid retention in his stomach.  [facility] and [facility] are going to contact the patient to see if an office visit is warranted at this time.    2.  Stage II sacral wound.  Patient states that the wound still has not changed and he it is painful to sit on.  He has not discussed with PT about a pressure relieving cushion for his chair.  Did state that the [facility] recommended a gel cushion.  Contacted PCP to get order for gel cushion.  3.  DMT2.  Patient states that his blood sugars have been running lower than normal. He states that they have been running as low as 98 and that he does feel nauseous  when it gets that low.  The nausea is relieved when he eats something. He is currently on Lantus 60 units QHS and Novolog 18 units TID.  Wife states that she has had trouble getting his Novolog but did not elaborate.  Discussed concerns with his endocrinologist's office (Dr. SNicholaus [phone].  They are going to call the patient with these concerns.    4.  Goals of care.  Went over ACP today.  Filled out MOST form with full code status but limited hospital interventions.  Patient does not want to be put on a ventilator if he has poor prognosis or is "brain dead."  Patient wanted antibiotics as indicated, IV fluids and feeding tube for a trial.  MOST form filled out and mailed to patient and [facility]  I spent 90 minutes providing this consultation,  from 10:00 to 11:30. More than 50% of the time in this consultation was spent coordinating communication.   HISTORY OF PRESENT ILLNESS:  [name] DGreenleyis a 73y.o. year old male with multiple medical problems including CHF, HTN, COPD, DMT2, lumbar spinal stenosis. Palliative Care was asked to help address goals of care.   CODE STATUS: Full Code  PPS: 50% HOSPICE ELIGIBILITY/DIAGNOSIS: TBD  PAST MEDICAL HISTORY:  Past Medical History:  Diagnosis Date  . Cervical myelopathy [place]    with  myelomalacia at C5-6 and C3-4  . CHF (congestive heart failure) ([place])   . Constipation - functional    controlled with miralax  . COPD (chronic obstructive pulmonary disease) (HCC)    mild  . DJD (degenerative joint disease)    WHOLE SPINE  . Dyspnea   . Gait disorder   . Heart murmur    years ago   . Hypertension   . Hypothyroidism    Low d/t treatment of hyperthyroidism  . Lumbosacral spinal stenosis 03/13/2014  . On home oxygen therapy    "2.5L all the time" (06/29/2018)  . Peripheral neuropathy   . Pneumonia   . PONV (postoperative nausea and vomiting)   . RBBB   . Renal disorder    acute kidney failure  . Restless  legs syndrome 03/14/2015  . RLS (restless legs syndrome)   . Sleep apnea    DX  2009/2010 [facility] done at [facility]  . 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 Type II diabetes mellitus ([place])    diet controlled    SOCIAL HX:  Social History   Tobacco Use  . Smoking status: Former Smoker    Packs/day: 2.00    Years: 20.00    Pack years: 40.00    Types: Cigarettes    Last attempt to quit: 06/28/1985    Years since quitting: 33.9  . Smokeless tobacco: Former Systems developer    Quit date: 06/28/1985  Substance Use Topics  . Alcohol use: No    ALLERGIES:  Allergies  Allergen Reactions  . Amlodipine Swelling and Other (See Comments)    Causes edema (per [facility] paperwork)  . Plendil [Felodipine] Swelling and Other (See Comments)    Causes edema (per [facility] paperwork)  . Compazine [Prochlorperazine] Other (See Comments)    Caused agitation (per [facility] paperwork)  . Cymbalta [Duloxetine Hcl] Other (See Comments)    Made the patient "feel weird," per [facility] paperwork  . Jardiance [Empagliflozin] Other (See Comments)    Stopped by MD because of unwanted side effects  . Other Nausea Only and Other (See Comments)    ANESTHESIA UNSPECIFIED- makes sick, must have anti-nausea medication in advance  . Levofloxacin In D5w Itching    Possible infusion reaction 03/29/17. Tolerating oral Levaquin 03/30/17  . Septra [Sulfamethoxazole-Trimethoprim] Nausea Only and Other (See Comments)    Per [facility] paperwork     PERTINENT MEDICATIONS:  Outpatient Encounter Medications as of 05/24/2019  Medication Sig  . aspirin EC 81 MG tablet Take 1 tablet (81 mg total) by mouth daily for 30 days.  [name] atorvastatin (LIPITOR) 20 MG tablet Take 1 tablet (20 mg total) by mouth daily. (Patient taking differently: Take 20 mg by mouth daily at 6 PM. )  . Blood Glucose Monitoring Suppl (ONETOUCH VERIO) w/Device KIT 1 kit by Does not apply route 4 (four) times daily. Use as directed to test  blood sugars 4 times daily DX E11.42  . Continuous Blood Gluc Receiver (FREESTYLE LIBRE 14 DAY READER) DEVI 1 kit by Does not apply route 3 (three) times daily.  . Continuous Blood Gluc Sensor (FREESTYLE LIBRE 14 DAY SENSOR) MISC 1 kit by Does not apply route 3 (three) times daily.  [name] escitalopram (LEXAPRO) 20 MG tablet Take 20 mg by mouth daily.  [name] gabapentin (NEURONTIN) 400 MG capsule TAKE 1 CAPSULE (400 MG TOTAL) BY MOUTH EVERY 8 (EIGHT) HOURS. (Patient taking differently: Take 400 mg by mouth 3 (three) times daily. )  .  glucose blood test strip Use as instructed to test blood sugars 4 times daily DX E11.42  . hydrALAZINE (APRESOLINE) 25 MG tablet Take 1 tablet (25 mg total) by mouth 3 (three) times daily for 30 days.  . insulin aspart (NOVOLOG FLEXPEN) 100 UNIT/ML FlexPen Inject 18 Units into the skin 3 (three) times daily with meals.  . Insulin Glargine (LANTUS SOLOSTAR) 100 UNIT/ML Solostar Pen Inject 60 Units into the skin at bedtime.  . Insulin Pen Needle (BD PEN NEEDLE MICRO U/F) 32G X 6 MM MISC Four times daily  . Insulin Pen Needle (BD PEN NEEDLE MICRO U/F) 32G X 6 MM MISC 4 times a day  . ketoconazole (NIZORAL) 2 % cream Apply 1 application topically daily.  . Lancets (ONETOUCH ULTRASOFT) lancets Use as instructed to test blood sugars 4 times daily DX E11.42  . levothyroxine (SYNTHROID, LEVOTHROID) 175 MCG tablet Take 1 tablet (175 mcg total) by mouth daily.  . metolazone (ZAROXOLYN) 2.5 MG tablet Take 1 tablet (2.5 mg total) by mouth once a week. Fridays and additional as directed by heart failure clinic (Patient taking differently: Take 2.5 mg by mouth once a week. )  . multivitamin (ONE-A-DAY MEN'S) TABS tablet Take 1 tablet by mouth at bedtime.  . polyethylene glycol (MIRALAX / GLYCOLAX) packet Take 17 g by mouth daily as needed. For constipation (Patient taking differently: Take 17 g by mouth at bedtime. )  . potassium chloride SA (K-DUR) 20 MEQ tablet Take 1 tablet (20 mEq total)  by mouth 2 (two) times daily. Take 1 tab only on days you take Metolazone (Patient taking differently: Take 20 mEq by mouth 2 (two) times daily. Take extra tablet when  you take Metolazone)  . pramipexole (MIRAPEX) 0.5 MG tablet One tablet in the morning and 2 in the evening (Patient taking differently: Take 0.5-1 mg by mouth See admin instructions. Take 1 tablet in the morning and take 1 or 2 tablets every evening)  . spironolactone (ALDACTONE) 25 MG tablet Take 1 tablet (25 mg total) by mouth daily.  . tamsulosin (FLOMAX) 0.4 MG CAPS capsule Take 1 capsule (0.4 mg total) by mouth daily. (Patient taking differently: Take 0.4 mg by mouth at bedtime. )  . torsemide (DEMADEX) 20 MG tablet Take 4 tablets (80 mg total) by mouth 2 (two) times daily.  . TOVIAZ 8 MG TB24 tablet Take 1 tablet (8 mg total) by mouth daily.   No facility-administered encounter medications on file as of 05/24/2019.      Rumaisa Schnetzer Jenetta Downer, NP

## 2019-05-24 NOTE — Telephone Encounter (Signed)
lft vm for pt to return call and informing him that I was reaching out due to a call from his cardiologist with concerns of low blood sugars.

## 2019-05-24 NOTE — Telephone Encounter (Signed)
Patient returned call

## 2019-05-25 NOTE — Telephone Encounter (Signed)
Pt informed and aware

## 2019-05-25 NOTE — Telephone Encounter (Signed)
Spoke to pt and got recent blood sugar readings as follows: 05/25/19 117 a.m. 05/24/19 109 a.m. 248 lunch 176 dinner 05/23/19 142 a.m. 138 lunch 181 dinner 05/22/19 98 a.m. 150 lunch 130 dinner  Pt currently taking novolog 18 units 3 times daily and lantus 60 units at night Please advise.

## 2019-05-25 NOTE — Telephone Encounter (Signed)
Patient is returning call.  °

## 2019-05-31 ENCOUNTER — Encounter (HOSPITAL_COMMUNITY): Payer: Self-pay

## 2019-05-31 ENCOUNTER — Telehealth: Payer: Self-pay

## 2019-05-31 ENCOUNTER — Ambulatory Visit (HOSPITAL_COMMUNITY)
Admission: RE | Admit: 2019-05-31 | Discharge: 2019-05-31 | Disposition: A | Discharge status: Home / Self Care | Payer: PPO | Source: Ambulatory Visit | Attending: Adult Health | Admitting: Adult Health

## 2019-05-31 ENCOUNTER — Other Ambulatory Visit: Payer: Self-pay

## 2019-05-31 VITALS — BP 130/70 | HR 69

## 2019-05-31 DIAGNOSIS — I5032 Chronic diastolic (congestive) heart failure: Secondary | ICD-10-CM | POA: Diagnosis not present

## 2019-05-31 DIAGNOSIS — L89159 Pressure ulcer of sacral region, unspecified stage: Secondary | ICD-10-CM | POA: Diagnosis not present

## 2019-05-31 DIAGNOSIS — G4731 Primary central sleep apnea: Secondary | ICD-10-CM | POA: Diagnosis not present

## 2019-05-31 DIAGNOSIS — J9611 Chronic respiratory failure with hypoxia: Secondary | ICD-10-CM

## 2019-05-31 DIAGNOSIS — G4733 Obstructive sleep apnea (adult) (pediatric): Secondary | ICD-10-CM | POA: Diagnosis not present

## 2019-05-31 DIAGNOSIS — N183 Chronic kidney disease, stage 3 unspecified: Secondary | ICD-10-CM

## 2019-05-31 NOTE — Patient Instructions (Signed)
Continue current medications  Your physician recommends that you schedule a follow-up appointment in: 6-8 weeks with another Telephone call appointment.  OUR OFFICE WILL CALL YOU TO SCHEDULE THIS.

## 2019-05-31 NOTE — Progress Notes (Signed)
Conrad , NP  Scarlette Calico, RN        [facility] to [facility] BMET this week.   He is an existing patient.   Follow up in 6-8 weeks to telehealth visit with me.    Called Tiffany, RN w/[facility] and gave VO or bmet this week.  Message to schedulers about f/u appt.  AVS mailed to pt

## 2019-05-31 NOTE — Addendum Note (Signed)
Encounter addended by: Scarlette Calico, RN on: 05/31/2019 3:33 PM  Actions taken: Clinical Note Signed

## 2019-05-31 NOTE — Progress Notes (Signed)
Heart Failure TeleHealth Note  Due to national recommendations of social distancing due to [place] 19, telehealth visit is felt to be most appropriate for this patient at this time.  I discussed the limitations, risks, security and privacy concerns of performing an evaluation and management service by telephone and the availability of in person appointments. I also discussed with the patient that there may be a patient responsible charge related to this service. The patient expressed understanding and agreed to proceed.   ID:  Travis [name], Travis [name] [birth year], MRN [id number]  Location: Home  Provider location: [address] [place] Type of Visit: Established patient   PCP:  [name], MD  Cardiologist:  Mertie Moores, MD Primary HF: Dr [name]  Chief Complaint: Hospital follow up   History of Present Illness: Travis [name] is a 73 y.o. 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  Admitted 5/2-5/13/20 with volume overload. Diuresed with lasix drip and metolazone. Had AKI that improved with switching back to oral diuretics. Also treated for cellulitis with doxy. PT recommended SNF, but he refused. DC weight 281 lbs. He was discharged on torsemide 80 mg daily with 2.5 mg metolazone weekly. Discharged with [facility] HHRN/HHPT.   Patient presents via audio conferencing for a telehealth visit today. Last visit torsemide was increased to 80 mg twice a day. He says his leg edema improved but the wraps on his legs are uncomfortable. Overall feeling fine. He remains SOB exertion but this is his baseline. He remains on oxygen at night.  Denies PND/Orthopnea. Appetite ok. No fever or chills. Taking all medications. Followed by [facility] and [facility] HHPT/ HHRN .     Pt denies symptoms of cough, fevers, chills, or new SOB worrisome for COVID 19.    Past Medical  History:  Diagnosis Date  . Cervical myelopathy (HCC)    with myelomalacia at C5-6 and C3-4  . CHF (congestive heart failure) ([place])   . Constipation - functional    controlled with miralax  . COPD (chronic obstructive pulmonary disease) (HCC)    mild  . DJD (degenerative joint disease)    WHOLE SPINE  . Dyspnea   . Gait disorder   . Heart murmur    years ago   . Hypertension   . Hypothyroidism    Low d/t treatment of hyperthyroidism  . Lumbosacral spinal stenosis 03/13/2014  . On home oxygen therapy    "2.5L all the time" (06/29/2018)  . Peripheral neuropathy   . Pneumonia   . PONV (postoperative nausea and vomiting)   . RBBB   . Renal disorder    acute kidney failure  . Restless legs syndrome 03/14/2015  . RLS (restless legs syndrome)   . Sleep apnea    DX  2009/2010 study done at [facility]  . 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 Type II diabetes mellitus ([place])    diet controlled   Past Surgical History:  Procedure Laterality Date  . BACK SURGERY     6 surgeries  . CARDIAC CATHETERIZATION [name] 09/07/2015   Procedure: Right/Left Heart Cath and Coronary Angiography;  Surgeon: Peter M Martinique, MD;  Location: [facility];  Service: Cardiovascular;  Laterality: [name];  . CHOLECYSTECTOMY    . EYE SURGERY     BIL CATARACTS  . LUMBAR LAMINECTOMY/DECOMPRESSION MICRODISCECTOMY  07/14/2012   Procedure: LUMBAR LAMINECTOMY/DECOMPRESSION MICRODISCECTOMY  2 LEVELS;  Surgeon: Eustace Moore, MD;  Location: [facility];  Service: Neurosurgery;  Laterality: Left;  Lumbar two three laminectomy, left thoracic four five transpedicular diskectomy  . POSTERIOR CERVICAL FUSION/FORAMINOTOMY  04/08/2018   Procedure: Laminectomy and Foraminotomy - Lumbar Four-Lumbar Five, instrumented posterolateral fusion Lumbar Four- Five, Laminectomy and Foraminotomy - Thoracic One-Thoracic Two;  Surgeon: Eustace Moore, MD;  Location: [facility] OR;  Service: Neurosurgery;;  posterior  . POSTERIOR LUMBAR FUSION   04/08/2018   Laminectomy and Foraminotomy - Lumbar Four-Lumbar Five, instrumented posterolateral fusion Lumbar Four- Five, Laminectomy and Foraminotomy - Thoracic One-Thoracic Two  . POSTERIOR LUMBAR FUSION  04/08/2018   Laminectomy and Foraminotomy - Lumbar Four-Lumbar Five, instrumented posterolateral fusion Lumbar Four- Five, Laminectomy and Foraminotomy - Thoracic One-Thoracic Two     Current Outpatient Medications  Medication Sig Dispense Refill  . aspirin EC 81 MG tablet Take 1 tablet (81 mg total) by mouth daily for 30 days. 30 tablet 0  . atorvastatin (LIPITOR) 20 MG tablet Take 1 tablet (20 mg total) by mouth daily. (Patient taking differently: Take 20 mg by mouth daily at 6 PM. ) 90 tablet 3  . Blood Glucose Monitoring Suppl (ONETOUCH VERIO) w/Device KIT 1 kit by Does not apply route 4 (four) times daily. Use as directed to test blood sugars 4 times daily DX E11.42 1 kit 0  . Continuous Blood Gluc Receiver (FREESTYLE LIBRE 14 DAY READER) DEVI 1 kit by Does not apply route 3 (three) times daily. 1 Device 0  . Continuous Blood Gluc Sensor (FREESTYLE LIBRE 14 DAY SENSOR) MISC 1 kit by Does not apply route 3 (three) times daily. 3 each 11  . escitalopram (LEXAPRO) 20 MG tablet Take 20 mg by mouth daily.    [name] gabapentin (NEURONTIN) 400 MG capsule TAKE 1 CAPSULE (400 MG TOTAL) BY MOUTH EVERY 8 (EIGHT) HOURS. (Patient taking differently: Take 400 mg by mouth 3 (three) times daily. ) 90 capsule 0  . glucose blood test strip Use as instructed to test blood sugars 4 times daily DX E11.42 100 each 12  . hydrALAZINE (APRESOLINE) 25 MG tablet Take 1 tablet (25 mg total) by mouth 3 (three) times daily for 30 days. 90 tablet 0  . insulin aspart (NOVOLOG FLEXPEN) 100 UNIT/ML FlexPen Inject 18 Units into the skin 3 (three) times daily with meals. 15 mL 11  . Insulin Glargine (LANTUS SOLOSTAR) 100 UNIT/ML Solostar Pen Inject 60 Units into the skin at bedtime. 15 mL 11  . Insulin Pen Needle (BD PEN NEEDLE  MICRO U/F) 32G X 6 MM MISC Four times daily 150 each 6  . Insulin Pen Needle (BD PEN NEEDLE MICRO U/F) 32G X 6 MM MISC 4 times a day 150 each 11  . ketoconazole (NIZORAL) 2 % cream Apply 1 application topically daily. 120 g 0  . Lancets (ONETOUCH ULTRASOFT) lancets Use as instructed to test blood sugars 4 times daily DX E11.42 100 each 12  . levothyroxine (SYNTHROID, LEVOTHROID) 175 MCG tablet Take 1 tablet (175 mcg total) by mouth daily. 30 tablet 2  . metolazone (ZAROXOLYN) 2.5 MG tablet Take 1 tablet (2.5 mg total) by mouth once a week. Fridays and additional as directed by heart failure clinic (Patient taking differently: Take 2.5 mg by mouth once a week. ) 10 tablet 6  . multivitamin (ONE-A-DAY MEN'S) TABS tablet Take 1 tablet by mouth at bedtime.    . polyethylene glycol (MIRALAX / GLYCOLAX) packet Take 17 g by mouth  daily as needed. For constipation (Patient taking differently: Take 17 g by mouth at bedtime. ) 14 each 0  . potassium chloride SA (K-DUR) 20 MEQ tablet Take 1 tablet (20 mEq total) by mouth 2 (two) times daily. Take 1 tab only on days you take Metolazone (Patient taking differently: Take 20 mEq by mouth 2 (two) times daily. Take extra tablet when  you take Metolazone) 60 tablet 0  . pramipexole (MIRAPEX) 0.5 MG tablet One tablet in the morning and 2 in the evening (Patient taking differently: Take 0.5-1 mg by mouth See admin instructions. Take 1 tablet in the morning and take 1 or 2 tablets every evening) 270 tablet 1  . spironolactone (ALDACTONE) 25 MG tablet Take 1 tablet (25 mg total) by mouth daily. 90 tablet 3  . tamsulosin (FLOMAX) 0.4 MG CAPS capsule Take 1 capsule (0.4 mg total) by mouth daily. (Patient taking differently: Take 0.4 mg by mouth at bedtime. ) 30 capsule 0  . torsemide (DEMADEX) 20 MG tablet Take 4 tablets (80 mg total) by mouth 2 (two) times daily. 240 tablet 0  . TOVIAZ 8 MG TB24 tablet Take 1 tablet (8 mg total) by mouth daily. 30 tablet 11   No current  facility-administered medications for this encounter.     Allergies:   Amlodipine; Plendil [felodipine]; Compazine [prochlorperazine]; Cymbalta [duloxetine hcl]; Jardiance [empagliflozin]; Other; Levofloxacin in d5w; and Septra [sulfamethoxazole-trimethoprim]   Social History:  The patient  reports that he quit smoking about 33 years ago. His smoking use included cigarettes. He has a 40.00 pack-year smoking history. He quit smokeless tobacco use about 33 years ago. He reports that he does not drink alcohol or use drugs.   Family History:  The patient's family history includes CAD in an other family member; Diabetes in his father; Heart disease in his father; Hypertension in his father; Polycythemia in his mother.   ROS:  Please see the history of present illness.   All other systems are personally reviewed and negative.    Exam:   Tele Health Call; Exam is audio  General:  Speaks in full sentences. No resp difficulty. Lungs: Normal respiratory effort with conversation.  Abdomen: No distension per patient report Extremities: R and LLE unna boots.  Neuro: Alert & oriented x 3.   Recent Labs: 06/29/2018: TSH 1.171 04/30/2019: ALT 16 05/01/2019: B Natriuretic Peptide 87.6 05/02/2019: Magnesium 2.4 05/09/2019: Hemoglobin 11.8; Platelets 207 05/11/2019: BUN 86; Creatinine, Ser 2.07; Potassium 4.1; Sodium 138  Personally reviewed   Wt Readings from Last 3 Encounters:  05/11/19 127.7 kg (281 lb 8.4 oz)  04/15/19 129.7 kg (286 lb)  03/04/19 126.1 kg (278 lb)      ASSESSMENT AND PLAN:  1.Chronic Diastolic Heart Failure with R>>L symptoms - ECHO 06/29/2018 EF 55-60% Grade IDD  - Suspect due to R-sided HF from OSA, OHS and COPD - PYP scan 10/2018 with Grade 1, H/CLL 1.45. Reviewed by Dr. Mauricia Area determined to be negative.Myeloma panel with no M-spike, normal IFE. - Echo 5/4 EF 60-65%, normal RV NYHA IIIb. Volume status sounds stable. Continue torsemide 80 mg twice a day + metolazone  once/week.  -Continue spiro 25 daily - Intolerant to [place] with UTI and rash.  2.Chronic Hypoxic Respiratory Failure/COPD - Continue BiPAP qHS with 3 L O2.   3. CKD Stage III -Ask [facility] to check BMET   4. OSA - Continue Bipap nightly.   5. CAD - Non-obstructive. LHC 08/2015: LM 40, LAD distal 40, LCx ostial 50,  RCA proximal 40 - No chest pain.  - On ASA/statin - Off b-blocker   6. DM - Uncontrolled. A1C is 10.0 -Intolerant to [place] with UTI and rash. - Per PCP.  7.Deconditioning - Followed by [facility] - PT  8. RLE/LLE  Continue unna boots if he can tolerate. He cant get ted hose on.    COVID screen The patient does not have any symptoms that suggest any further testing/ screening at this time.  Social distancing reinforced today.  Patient Risk: After full review of this patients clinical status, I feel that they are at moderate risk for cardiac decompensation at this time.  Orders/Follow up: Set up BMET by [facility]. Follow up in 8 weeks with telehealth visit.   Today, I have spent  11 minutes with the patient with telehealth technology discussing the above.    Jeanmarie Hubert, NP  05/31/2019 2:25 PM   [facility] [address] [facility] [place] [address] [phone] (office) [phone] (fax)

## 2019-05-31 NOTE — Telephone Encounter (Signed)
Spoke with pt and obtained verbal consent for a video visit. Pt will have vitals ready for appt.    YOUR CARDIOLOGY TEAM HAS ARRANGED FOR AN E-VISIT FOR YOUR APPOINTMENT - PLEASE REVIEW IMPORTANT INFORMATION BELOW SEVERAL DAYS PRIOR TO YOUR APPOINTMENT  Due to the recent COVID-19 pandemic, we are transitioning in-person office visits to tele-medicine visits in an effort to decrease unnecessary exposure to our patients, their families, and staff. These visits are billed to your insurance just like a normal visit is. We also encourage you to sign up for MyChart if you have not already done so. You will need a smartphone if possible. For patients that do not have this, we can still complete the visit using a regular telephone but do prefer a smartphone to enable video when possible. You may have a family member that lives with you that can help. If possible, we also ask that you have a blood pressure cuff and scale at home to measure your blood pressure, heart rate and weight prior to your scheduled appointment. Patients with clinical needs that need an in-person evaluation and testing will still be able to come to the office if absolutely necessary. If you have any questions, feel free to call our office.     YOUR PROVIDER WILL BE USING THE FOLLOWING PLATFORM TO COMPLETE YOUR VISIT: [email or web address]  . IF USING MYCHART - How to Download the MyChart App to Your SmartPhone   - If Apple, go to [employer] and type in MyChart in the search bar and download the app. If Android, ask patient to go to [employer] and type in [place] in the search bar and download the app. The app is free but as with any other app downloads, your phone may require you to verify saved payment information or Apple/Android password.  - You will need to then log into the app with your MyChart username and password, and select [facility] as your healthcare provider to link the account.  - When it is time for your visit, go to the  MyChart app, find appointments, and click Begin Video Visit. Be sure to Select Allow for your device to access the Microphone and Camera for your visit. You will then be connected, and your provider will be with you shortly.  **If you have any issues connecting or need assistance, please contact MyChart service desk [phone])**  **If using a computer, in order to ensure the best quality for your visit, you will need to use either of the following Internet Browsers: Insurance underwriter or [employer]**  . IF USING DOXIMITY or [email or web address] - The staff will give you instructions on receiving your link to join the meeting the day of your visit.      2-3 DAYS BEFORE YOUR APPOINTMENT  You will receive a telephone call from one of our [place] team members - your caller ID may say "Unknown caller." If this is a video visit, we will walk you through how to get the video launched on your phone. We will remind you check your blood pressure, heart rate and weight prior to your scheduled appointment. If you have an Apple Watch or Kardia, please upload any pertinent ECG strips the day before or morning of your appointment to [place]. Our staff will also make sure you have reviewed the consent and agree to move forward with your scheduled tele-health visit.     THE DAY OF YOUR APPOINTMENT  Approximately 15 minutes prior to your scheduled  appointment, you will receive a telephone call from one of [place] team - your caller ID may say "Unknown caller."  Our staff will confirm medications, vital signs for the day and any symptoms you may be experiencing. Please have this information available prior to the time of visit start. It may also be helpful for you to have a pad of paper and pen handy for any instructions given during your visit. They will also walk you through joining the smartphone meeting if this is a video visit.    CONSENT FOR TELE-HEALTH VISIT - PLEASE REVIEW  I hereby voluntarily  request, consent and authorize [facility] and its employed or contracted physicians, physician assistants, nurse practitioners or other licensed health care professionals (the Practitioner), to provide me with telemedicine health care services (the "Services") as deemed necessary by the treating Practitioner. I acknowledge and consent to receive the Services by the Practitioner via telemedicine. I understand that the telemedicine visit will involve communicating with the Practitioner through live audiovisual communication technology and the disclosure of certain medical information by electronic transmission. I acknowledge that I have been given the opportunity to request an in-person assessment or other available alternative prior to the telemedicine visit and am voluntarily participating in the telemedicine visit.  I understand that I have the right to withhold or withdraw my consent to the use of telemedicine in the course of my care at any time, without affecting my right to future care or treatment, and that the Practitioner or I may terminate the telemedicine visit at any time. I understand that I have the right to inspect all information obtained and/or recorded in the course of the telemedicine visit and may receive copies of available information for a reasonable fee.  I understand that some of the potential risks of receiving the Services via telemedicine include:  [name] Delay or interruption in medical evaluation due to technological equipment failure or disruption; . Information transmitted may not be sufficient (e.g. poor resolution of images) to allow for appropriate medical decision making by the Practitioner; and/or  . In rare instances, security protocols could fail, causing a breach of personal health information.  Furthermore, I acknowledge that it is my responsibility to provide information about my medical history, conditions and care that is complete and accurate to the best of my  ability. I acknowledge that Practitioner's advice, recommendations, and/or decision may be based on factors not within their control, such as incomplete or inaccurate data provided by me or distortions of diagnostic images or specimens that may result from electronic transmissions. I understand that the practice of medicine is not an exact science and that Practitioner makes no warranties or guarantees regarding treatment outcomes. I acknowledge that I will receive a copy of this consent concurrently upon execution via email to the email address I last provided but may also request a printed copy by calling the office of [facility].    I understand that my insurance will be billed for this visit.   I have read or had this consent read to me. . I understand the contents of this consent, which adequately explains the benefits and risks of the Services being provided via telemedicine.  . I have been provided ample opportunity to ask questions regarding this consent and the Services and have had my questions answered to my satisfaction. . I give my informed consent for the services to be provided through the use of telemedicine in my medical care  By participating in this telemedicine  visit I agree to the above.  

## 2019-06-01 DIAGNOSIS — N401 Enlarged prostate with lower urinary tract symptoms: Secondary | ICD-10-CM | POA: Diagnosis not present

## 2019-06-01 DIAGNOSIS — E039 Hypothyroidism, unspecified: Secondary | ICD-10-CM | POA: Diagnosis not present

## 2019-06-01 DIAGNOSIS — N183 Chronic kidney disease, stage 3 (moderate): Secondary | ICD-10-CM | POA: Diagnosis not present

## 2019-06-01 DIAGNOSIS — Z794 Long term (current) use of insulin: Secondary | ICD-10-CM | POA: Diagnosis not present

## 2019-06-01 DIAGNOSIS — M4807 Spinal stenosis, lumbosacral region: Secondary | ICD-10-CM | POA: Diagnosis not present

## 2019-06-01 DIAGNOSIS — M50021 Cervical disc disorder at C4-C5 level with myelopathy: Secondary | ICD-10-CM | POA: Diagnosis not present

## 2019-06-01 DIAGNOSIS — G2581 Restless legs syndrome: Secondary | ICD-10-CM | POA: Diagnosis not present

## 2019-06-01 DIAGNOSIS — M519 Unspecified thoracic, thoracolumbar and lumbosacral intervertebral disc disorder: Secondary | ICD-10-CM | POA: Diagnosis not present

## 2019-06-01 DIAGNOSIS — I2510 Atherosclerotic heart disease of native coronary artery without angina pectoris: Secondary | ICD-10-CM | POA: Diagnosis not present

## 2019-06-01 DIAGNOSIS — Z87891 Personal history of nicotine dependence: Secondary | ICD-10-CM | POA: Diagnosis not present

## 2019-06-01 DIAGNOSIS — I130 Hypertensive heart and chronic kidney disease with heart failure and stage 1 through stage 4 chronic kidney disease, or unspecified chronic kidney disease: Secondary | ICD-10-CM | POA: Diagnosis not present

## 2019-06-01 DIAGNOSIS — G4733 Obstructive sleep apnea (adult) (pediatric): Secondary | ICD-10-CM | POA: Diagnosis not present

## 2019-06-01 DIAGNOSIS — J449 Chronic obstructive pulmonary disease, unspecified: Secondary | ICD-10-CM | POA: Diagnosis not present

## 2019-06-01 DIAGNOSIS — Z9981 Dependence on supplemental oxygen: Secondary | ICD-10-CM | POA: Diagnosis not present

## 2019-06-01 DIAGNOSIS — L89152 Pressure ulcer of sacral region, stage 2: Secondary | ICD-10-CM | POA: Diagnosis not present

## 2019-06-01 DIAGNOSIS — E1122 Type 2 diabetes mellitus with diabetic chronic kidney disease: Secondary | ICD-10-CM | POA: Diagnosis not present

## 2019-06-01 DIAGNOSIS — R338 Other retention of urine: Secondary | ICD-10-CM | POA: Diagnosis not present

## 2019-06-01 DIAGNOSIS — I5033 Acute on chronic diastolic (congestive) heart failure: Secondary | ICD-10-CM | POA: Diagnosis not present

## 2019-06-01 DIAGNOSIS — E1142 Type 2 diabetes mellitus with diabetic polyneuropathy: Secondary | ICD-10-CM | POA: Diagnosis not present

## 2019-06-01 DIAGNOSIS — I4510 Unspecified right bundle-branch block: Secondary | ICD-10-CM | POA: Diagnosis not present

## 2019-06-01 DIAGNOSIS — F329 Major depressive disorder, single episode, unspecified: Secondary | ICD-10-CM | POA: Diagnosis not present

## 2019-06-01 DIAGNOSIS — M4712 Other spondylosis with myelopathy, cervical region: Secondary | ICD-10-CM | POA: Diagnosis not present

## 2019-06-01 DIAGNOSIS — S81812D Laceration without foreign body, left lower leg, subsequent encounter: Secondary | ICD-10-CM | POA: Diagnosis not present

## 2019-06-04 DIAGNOSIS — N183 Chronic kidney disease, stage 3 (moderate): Secondary | ICD-10-CM | POA: Diagnosis not present

## 2019-06-04 DIAGNOSIS — E1122 Type 2 diabetes mellitus with diabetic chronic kidney disease: Secondary | ICD-10-CM | POA: Diagnosis not present

## 2019-06-10 ENCOUNTER — Other Ambulatory Visit: Payer: Self-pay

## 2019-06-10 ENCOUNTER — Telehealth (HOSPITAL_COMMUNITY): Payer: Self-pay

## 2019-06-10 ENCOUNTER — Encounter: Payer: Self-pay | Admitting: Cardiovascular Disease

## 2019-06-10 ENCOUNTER — Telehealth (INDEPENDENT_AMBULATORY_CARE_PROVIDER_SITE_OTHER): Payer: PPO | Admitting: Cardiovascular Disease

## 2019-06-10 VITALS — BP 122/58 | HR 70 | Ht 73.0 in | Wt 285.0 lb

## 2019-06-10 DIAGNOSIS — I5032 Chronic diastolic (congestive) heart failure: Secondary | ICD-10-CM

## 2019-06-10 DIAGNOSIS — Z7189 Other specified counseling: Secondary | ICD-10-CM | POA: Diagnosis not present

## 2019-06-10 NOTE — Telephone Encounter (Signed)
-----   Message from Conrad Cullman, NP sent at 06/10/2019 10:16 AM EDT ----- Regarding: [facility] Please ask Vanita Ingles to check on Mr Oki.   Aks her to check BMET and Reds Clip  Tks A

## 2019-06-10 NOTE — Progress Notes (Signed)
Virtual Visit via Telephone Note   This visit type was conducted due to national recommendations for restrictions regarding the COVID-19 Pandemic (e.g. social distancing) in an effort to limit this patient's exposure and mitigate transmission in our community.  Due to his co-morbid illnesses, this patient is at least at moderate risk for complications without adequate follow up.  This format is felt to be most appropriate for this patient at this time.  The patient did not have access to video technology/had technical difficulties with video requiring transitioning to audio format only (telephone).  All issues noted in this document were discussed and addressed.  No physical exam could be performed with this format.  Please refer to the patient's chart for his  consent to telehealth for [name].   Date:  06/10/2019   ID:  Travis [name], Travis [name] 17-Oct-1946, MRN [id number]  Patient Location: Home Provider Location: Home  PCP:  [name], MD  Cardiologist:  Mertie Moores, MD / [place]  Electrophysiologist:  None   Evaluation Performed:  Follow-Up Visit  Chief Complaint:  CHF   History of Present Illness:    Travis [name] is a 73 y.o. male with CHF.   He is seeing CHF clinic Also has Palliative care  demadex has been increased to 80  BID   Seems to be doing well Has continued leg swelling  Keeps his leg elevated.  Avoids salt.   The patient does not have symptoms concerning for COVID-19 infection (fever, chills, cough, or new shortness of breath).    Past Medical History:  Diagnosis Date   Cervical myelopathy (HCC)    with myelomalacia at C5-6 and C3-4   CHF (congestive heart failure) (HCC)    Constipation - functional    controlled with miralax   COPD (chronic obstructive pulmonary disease) (HCC)    mild   DJD (degenerative joint disease)    WHOLE SPINE   Dyspnea    Gait disorder    Heart murmur    years ago    Hypertension    Hypothyroidism      Low d/t treatment of hyperthyroidism   Lumbosacral spinal stenosis 03/13/2014   On home oxygen therapy    "2.5L all the time" (06/29/2018)   Peripheral neuropathy    Pneumonia    PONV (postoperative nausea and vomiting)    RBBB    Renal disorder    acute kidney failure   Restless legs syndrome 03/14/2015   RLS (restless legs syndrome)    Sleep apnea    DX  2009/2010 [facility] done at [facility]   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   Type II diabetes mellitus ([place])    diet controlled   Past Surgical History:  Procedure Laterality Date   BACK SURGERY     6 surgeries   CARDIAC CATHETERIZATION [name] 09/07/2015   Procedure: Right/Left Heart Cath and Coronary Angiography;  Surgeon: Peter M Martinique, MD;  Location: [facility];  Service: Cardiovascular;  Laterality: [name];   CHOLECYSTECTOMY     EYE SURGERY     BIL CATARACTS   LUMBAR LAMINECTOMY/DECOMPRESSION MICRODISCECTOMY  07/14/2012   Procedure: LUMBAR LAMINECTOMY/DECOMPRESSION MICRODISCECTOMY 2 LEVELS;  Surgeon: Eustace Moore, MD;  Location: [facility];  Service: Neurosurgery;  Laterality: Left;  Lumbar two three laminectomy, left thoracic four five transpedicular diskectomy   POSTERIOR CERVICAL FUSION/FORAMINOTOMY  04/08/2018   Procedure: Laminectomy and Foraminotomy - Lumbar Four-Lumbar Five, instrumented posterolateral fusion Lumbar Four- Five, Laminectomy and  Foraminotomy - Thoracic One-Thoracic Two;  Surgeon: Eustace Moore, MD;  Location: [facility] OR;  Service: Neurosurgery;;  posterior   POSTERIOR LUMBAR FUSION  04/08/2018   Laminectomy and Foraminotomy - Lumbar Four-Lumbar Five, instrumented posterolateral fusion Lumbar Four- Five, Laminectomy and Foraminotomy - Thoracic One-Thoracic Two   POSTERIOR LUMBAR FUSION  04/08/2018   Laminectomy and Foraminotomy - Lumbar Four-Lumbar Five, instrumented posterolateral fusion Lumbar Four- Five, Laminectomy and Foraminotomy - Thoracic One-Thoracic Two     Current Meds  Medication  Sig   aspirin EC 81 MG tablet Take 1 tablet (81 mg total) by mouth daily for 30 days.   atorvastatin (LIPITOR) 20 MG tablet Take 20 mg by mouth daily at 6 PM.   Blood Glucose Monitoring Suppl (ONETOUCH VERIO) w/Device KIT 1 kit by Does not apply route 4 (four) times daily. Use as directed to test blood sugars 4 times daily DX E11.42   Continuous Blood Gluc Receiver (FREESTYLE LIBRE 14 DAY READER) DEVI 1 kit by Does not apply route 3 (three) times daily.   Continuous Blood Gluc Sensor (FREESTYLE LIBRE 14 DAY SENSOR) MISC 1 kit by Does not apply route 3 (three) times daily.   escitalopram (LEXAPRO) 20 MG tablet Take 20 mg by mouth daily.   gabapentin (NEURONTIN) 400 MG capsule Take 400 mg by mouth 3 (three) times daily.   glucose blood test strip Use as instructed to test blood sugars 4 times daily DX E11.42   hydrALAZINE (APRESOLINE) 25 MG tablet Take 25 mg by mouth 3 (three) times daily.   insulin aspart (NOVOLOG FLEXPEN) 100 UNIT/ML FlexPen Inject 18 Units into the skin 3 (three) times daily with meals.   Insulin Glargine (LANTUS SOLOSTAR) 100 UNIT/ML Solostar Pen Inject 60 Units into the skin at bedtime.   Insulin Pen Needle (BD PEN NEEDLE MICRO U/F) 32G X 6 MM MISC Four times daily   Insulin Pen Needle (BD PEN NEEDLE MICRO U/F) 32G X 6 MM MISC 4 times a day   ketoconazole (NIZORAL) 2 % cream Apply 1 application topically daily.   Lancets (ONETOUCH ULTRASOFT) lancets Use as instructed to test blood sugars 4 times daily DX E11.42   levothyroxine (SYNTHROID, LEVOTHROID) 175 MCG tablet Take 1 tablet (175 mcg total) by mouth daily.   metolazone (ZAROXOLYN) 2.5 MG tablet Take 2.5 mg by mouth once a week. Take one tablet by mouth every Friday.   multivitamin (ONE-A-DAY MEN'S) TABS tablet Take 1 tablet by mouth at bedtime.   polyethylene glycol (MIRALAX / GLYCOLAX) packet Take 17 g by mouth daily as needed. For constipation (Patient taking differently: Take 17 g by mouth at bedtime.  )   potassium chloride SA (K-DUR) 20 MEQ tablet Take 1 tablet (20 mEq total) by mouth 2 (two) times daily. Take 1 tab only on days you take Metolazone   pramipexole (MIRAPEX) 0.5 MG tablet One tablet in the morning and 2 in the evening (Patient taking differently: Take 0.5-1 mg by mouth See admin instructions. Take 1 tablet in the morning and take 1 or 2 tablets every evening)   spironolactone (ALDACTONE) 25 MG tablet Take 1 tablet (25 mg total) by mouth daily.   tamsulosin (FLOMAX) 0.4 MG CAPS capsule Take 1 capsule (0.4 mg total) by mouth daily.   torsemide (DEMADEX) 20 MG tablet Take 4 tablets (80 mg total) by mouth 2 (two) times daily.   TOVIAZ 8 MG TB24 tablet Take 1 tablet (8 mg total) by mouth daily.     Allergies:  Amlodipine, Plendil [felodipine], Compazine [prochlorperazine], Cymbalta [duloxetine hcl], Jardiance [empagliflozin], Other, Levofloxacin in d5w, and Septra [sulfamethoxazole-trimethoprim]   Social History   Tobacco Use   Smoking status: Former Smoker    Packs/day: 2.00    Years: 20.00    Pack years: 40.00    Types: Cigarettes    Quit date: 06/28/1985    Years since quitting: 33.9   Smokeless tobacco: Former Systems developer    Quit date: 06/28/1985  Substance Use Topics   Alcohol use: No   Drug use: No     Family Hx: The patient's family history includes CAD in an other family member; Diabetes in his father; Heart disease in his father; Hypertension in his father; Polycythemia in his mother.  ROS:   Please see the history of present illness.     All other systems reviewed and are negative.   Prior CV studies:   The following studies were reviewed today:    Labs/Other Tests and Data Reviewed:    EKG:  No ECG reviewed.  Recent Labs: 06/29/2018: TSH 1.171 04/30/2019: ALT 16 05/01/2019: B Natriuretic Peptide 87.6 05/02/2019: Magnesium 2.4 05/09/2019: Hemoglobin 11.8; Platelets 207 05/11/2019: BUN 86; Creatinine, Ser 2.07; Potassium 4.1; Sodium 138   Recent Lipid  Panel Lab Results  Component Value Date/Time   CHOL 124 08/20/2015 02:58 PM   TRIG 82 08/20/2015 02:58 PM   HDL 29 (L) 08/20/2015 02:58 PM   CHOLHDL 4.3 08/20/2015 02:58 PM   LDLCALC 79 08/20/2015 02:58 PM    Wt Readings from Last 3 Encounters:  06/10/19 285 lb (129.3 kg)  05/11/19 281 lb 8.4 oz (127.7 kg)  04/15/19 286 lb (129.7 kg)     Objective:    Vital Signs:  BP (!) 122/58    Pulse 70    Ht 6' 1"  (1.854 m)    Wt 285 lb (129.3 kg)    BMI 37.60 kg/m    VITAL SIGNS:  reviewed GEN:  no acute distress EYES:  sclerae anicteric, EOMI - Extraocular Movements Intact RESPIRATORY:  normal respiratory effort, symmetric expansion CARDIOVASCULAR:  no peripheral edema SKIN:  no rash, lesions or ulcers. MUSCULOSKELETAL:  no obvious deformities. NEURO:  alert and oriented x 3, no obvious focal deficit PSYCH:  normal affect  ASSESSMENT & PLAN:    1. Chronic Diastolic CHF:  BP is well controlled.   Sees CHF clinic also .   On Torsemide 80 BID and metalazone 2.5 weekly .   Will try to get a BMP from home health .   2.  Leg weakness - stable, persistent   3.    COVID-19 Education: The signs and symptoms of COVID-19 were discussed with the patient and how to seek care for testing (follow up with PCP or arrange E-visit).  The importance of social distancing was discussed today.  Time:   Today, I have spent  26  minutes with the patient with telehealth technology discussing the above problems.     Medication Adjustments/Labs and Tests Ordered: Current medicines are reviewed at length with the patient today.  Concerns regarding medicines are outlined above.   Tests Ordered: No orders of the defined types were placed in this encounter.   Medication Changes: No orders of the defined types were placed in this encounter.   Follow Up:  In Person in 3 month(s)  Signed, Mertie Moores, MD  06/10/2019 9:25 AM    [facility]

## 2019-06-10 NOTE — Telephone Encounter (Signed)
Spoke with Jonelle Sidle of [facility]. Orders submitted to her for bmet and reds clip. They will be done today and results forwarded to office.

## 2019-06-10 NOTE — Patient Instructions (Signed)
Medication Instructions:  Your physician recommends that you continue on your current medications as directed. Please refer to the Current Medication list given to you today.  If you need a refill on your cardiac medications before your next appointment, please call your pharmacy.   Lab work: Medical laboratory scientific officer needed - will check with CHF clinic to see if home health will obtain lab work  If you have labs (blood work) drawn today and your tests are completely normal, you will receive your results only by: [name] MyChart Message (if you have MyChart) OR . A paper copy in the mail If you have any lab test that is abnormal or we need to change your treatment, we will call you to review the results.   Testing/Procedures: None Ordered   Follow-Up: Your physician recommends that you return for a follow-up appointment on Friday September 11 at 9:00 am with Dr. Acie Fredrickson

## 2019-06-16 ENCOUNTER — Telehealth (HOSPITAL_COMMUNITY): Payer: Self-pay | Admitting: *Deleted

## 2019-06-16 DIAGNOSIS — R338 Other retention of urine: Secondary | ICD-10-CM | POA: Diagnosis not present

## 2019-06-16 DIAGNOSIS — I4510 Unspecified right bundle-branch block: Secondary | ICD-10-CM | POA: Diagnosis not present

## 2019-06-16 DIAGNOSIS — I130 Hypertensive heart and chronic kidney disease with heart failure and stage 1 through stage 4 chronic kidney disease, or unspecified chronic kidney disease: Secondary | ICD-10-CM | POA: Diagnosis not present

## 2019-06-16 DIAGNOSIS — Z794 Long term (current) use of insulin: Secondary | ICD-10-CM | POA: Diagnosis not present

## 2019-06-16 DIAGNOSIS — E039 Hypothyroidism, unspecified: Secondary | ICD-10-CM | POA: Diagnosis not present

## 2019-06-16 DIAGNOSIS — M519 Unspecified thoracic, thoracolumbar and lumbosacral intervertebral disc disorder: Secondary | ICD-10-CM | POA: Diagnosis not present

## 2019-06-16 DIAGNOSIS — N401 Enlarged prostate with lower urinary tract symptoms: Secondary | ICD-10-CM | POA: Diagnosis not present

## 2019-06-16 DIAGNOSIS — M50021 Cervical disc disorder at C4-C5 level with myelopathy: Secondary | ICD-10-CM | POA: Diagnosis not present

## 2019-06-16 DIAGNOSIS — Z9981 Dependence on supplemental oxygen: Secondary | ICD-10-CM | POA: Diagnosis not present

## 2019-06-16 DIAGNOSIS — J449 Chronic obstructive pulmonary disease, unspecified: Secondary | ICD-10-CM | POA: Diagnosis not present

## 2019-06-16 DIAGNOSIS — E1142 Type 2 diabetes mellitus with diabetic polyneuropathy: Secondary | ICD-10-CM | POA: Diagnosis not present

## 2019-06-16 DIAGNOSIS — G2581 Restless legs syndrome: Secondary | ICD-10-CM | POA: Diagnosis not present

## 2019-06-16 DIAGNOSIS — N183 Chronic kidney disease, stage 3 (moderate): Secondary | ICD-10-CM | POA: Diagnosis not present

## 2019-06-16 DIAGNOSIS — S81812D Laceration without foreign body, left lower leg, subsequent encounter: Secondary | ICD-10-CM | POA: Diagnosis not present

## 2019-06-16 DIAGNOSIS — F329 Major depressive disorder, single episode, unspecified: Secondary | ICD-10-CM | POA: Diagnosis not present

## 2019-06-16 DIAGNOSIS — M4712 Other spondylosis with myelopathy, cervical region: Secondary | ICD-10-CM | POA: Diagnosis not present

## 2019-06-16 DIAGNOSIS — E1122 Type 2 diabetes mellitus with diabetic chronic kidney disease: Secondary | ICD-10-CM | POA: Diagnosis not present

## 2019-06-16 DIAGNOSIS — I5033 Acute on chronic diastolic (congestive) heart failure: Secondary | ICD-10-CM | POA: Diagnosis not present

## 2019-06-16 DIAGNOSIS — I2510 Atherosclerotic heart disease of native coronary artery without angina pectoris: Secondary | ICD-10-CM | POA: Diagnosis not present

## 2019-06-16 DIAGNOSIS — M4807 Spinal stenosis, lumbosacral region: Secondary | ICD-10-CM | POA: Diagnosis not present

## 2019-06-16 DIAGNOSIS — G4733 Obstructive sleep apnea (adult) (pediatric): Secondary | ICD-10-CM | POA: Diagnosis not present

## 2019-06-16 DIAGNOSIS — L89152 Pressure ulcer of sacral region, stage 2: Secondary | ICD-10-CM | POA: Diagnosis not present

## 2019-06-16 DIAGNOSIS — Z87891 Personal history of nicotine dependence: Secondary | ICD-10-CM | POA: Diagnosis not present

## 2019-06-16 NOTE — Telephone Encounter (Signed)
Spoke with pts wife she verbalized understanding. Order faxed to [facility] to draw labs next week

## 2019-06-16 NOTE — Telephone Encounter (Signed)
pts wife called stating pt has been retaining fluid despite taking torsemide 80mg  bid.  Pts legs are wrapped but he has swelling in abdomen and is short of breath. Pts wife asks that we increase medication.  Message routed to amy for advice

## 2019-06-16 NOTE — Telephone Encounter (Signed)
Left message for wife to call back.

## 2019-06-16 NOTE — Telephone Encounter (Signed)
   Please call.   Take 2.5 mg metolazone today with extra 20 meq potassium.   Needs BMET next week.   Amy Clegg NP-C  10:06 AM

## 2019-06-20 DIAGNOSIS — G9340 Encephalopathy, unspecified: Secondary | ICD-10-CM | POA: Diagnosis not present

## 2019-06-20 DIAGNOSIS — I5033 Acute on chronic diastolic (congestive) heart failure: Secondary | ICD-10-CM | POA: Diagnosis not present

## 2019-06-20 DIAGNOSIS — M4800 Spinal stenosis, site unspecified: Secondary | ICD-10-CM | POA: Diagnosis not present

## 2019-06-21 ENCOUNTER — Ambulatory Visit: Payer: Medicare Other | Admitting: Neurology

## 2019-06-23 DIAGNOSIS — E890 Postprocedural hypothyroidism: Secondary | ICD-10-CM | POA: Diagnosis not present

## 2019-06-23 DIAGNOSIS — E1129 Type 2 diabetes mellitus with other diabetic kidney complication: Secondary | ICD-10-CM | POA: Diagnosis not present

## 2019-06-23 DIAGNOSIS — I5030 Unspecified diastolic (congestive) heart failure: Secondary | ICD-10-CM | POA: Diagnosis not present

## 2019-06-23 DIAGNOSIS — N400 Enlarged prostate without lower urinary tract symptoms: Secondary | ICD-10-CM | POA: Diagnosis not present

## 2019-06-23 DIAGNOSIS — I129 Hypertensive chronic kidney disease with stage 1 through stage 4 chronic kidney disease, or unspecified chronic kidney disease: Secondary | ICD-10-CM | POA: Diagnosis not present

## 2019-06-23 DIAGNOSIS — N183 Chronic kidney disease, stage 3 (moderate): Secondary | ICD-10-CM | POA: Diagnosis not present

## 2019-06-23 DIAGNOSIS — I5033 Acute on chronic diastolic (congestive) heart failure: Secondary | ICD-10-CM | POA: Diagnosis not present

## 2019-06-24 ENCOUNTER — Telehealth: Payer: Self-pay

## 2019-06-24 NOTE — Telephone Encounter (Signed)
Called patient to reschedule 07/15/19 appointment but I did not get an answer left VM letting patient know Dr. Kelton Pillar will be on vacation that day

## 2019-07-12 ENCOUNTER — Telehealth (HOSPITAL_COMMUNITY): Payer: Self-pay | Admitting: Cardiology

## 2019-07-12 MED ORDER — METOLAZONE 2.5 MG PO TABS: 2.5000 mg | ORAL_TABLET | ORAL | 3 refills | Status: DC

## 2019-07-12 NOTE — Telephone Encounter (Signed)
PTS WIFE AWARE AND VOICED UNDERSTANDING, LABS TO BE DONE AT PCP WILL HAVE COPY SENT OVER

## 2019-07-12 NOTE — Telephone Encounter (Signed)
Patients wife called to report increased swelling, increased SOB and increased fatigue -unable to weigh at home    Torsemide 80 mg BID Metolazone 2.5 q Fri Spiro 25 daily -scheduled for labs 07/15/19  Wife reports an extra dose of metolazone helped some a few weeks ago and is unsure if he can take again or shoud his torsemide increase

## 2019-07-12 NOTE — Telephone Encounter (Signed)
Yep give him a dose of metolazone today and then every Monday and Friday. Limit fluid intake. BMET next week. Thanks.

## 2019-07-13 ENCOUNTER — Encounter (HOSPITAL_COMMUNITY): Payer: Self-pay | Admitting: Emergency Medicine

## 2019-07-13 ENCOUNTER — Emergency Department (HOSPITAL_COMMUNITY): Payer: PPO

## 2019-07-13 ENCOUNTER — Inpatient Hospital Stay (HOSPITAL_COMMUNITY)
Admission: EM | Admit: 2019-07-13 | Discharge: 2019-07-18 | DRG: 286 | Disposition: A | Discharge status: Home / Self Care | Payer: PPO | Attending: Internal Medicine | Admitting: Internal Medicine

## 2019-07-13 ENCOUNTER — Other Ambulatory Visit: Payer: Self-pay

## 2019-07-13 DIAGNOSIS — E669 Obesity, unspecified: Secondary | ICD-10-CM | POA: Diagnosis not present

## 2019-07-13 DIAGNOSIS — Z1159 Encounter for screening for other viral diseases: Secondary | ICD-10-CM

## 2019-07-13 DIAGNOSIS — F329 Major depressive disorder, single episode, unspecified: Secondary | ICD-10-CM | POA: Diagnosis not present

## 2019-07-13 DIAGNOSIS — I509 Heart failure, unspecified: Secondary | ICD-10-CM

## 2019-07-13 DIAGNOSIS — E785 Hyperlipidemia, unspecified: Secondary | ICD-10-CM | POA: Diagnosis present

## 2019-07-13 DIAGNOSIS — G2581 Restless legs syndrome: Secondary | ICD-10-CM | POA: Diagnosis present

## 2019-07-13 DIAGNOSIS — J449 Chronic obstructive pulmonary disease, unspecified: Secondary | ICD-10-CM | POA: Diagnosis not present

## 2019-07-13 DIAGNOSIS — I110 Hypertensive heart disease with heart failure: Secondary | ICD-10-CM | POA: Diagnosis not present

## 2019-07-13 DIAGNOSIS — G9589 Other specified diseases of spinal cord: Secondary | ICD-10-CM | POA: Diagnosis present

## 2019-07-13 DIAGNOSIS — Z993 Dependence on wheelchair: Secondary | ICD-10-CM

## 2019-07-13 DIAGNOSIS — Z794 Long term (current) use of insulin: Secondary | ICD-10-CM

## 2019-07-13 DIAGNOSIS — M1990 Unspecified osteoarthritis, unspecified site: Secondary | ICD-10-CM | POA: Diagnosis not present

## 2019-07-13 DIAGNOSIS — I5033 Acute on chronic diastolic (congestive) heart failure: Secondary | ICD-10-CM | POA: Diagnosis not present

## 2019-07-13 DIAGNOSIS — Z833 Family history of diabetes mellitus: Secondary | ICD-10-CM

## 2019-07-13 DIAGNOSIS — I272 Pulmonary hypertension, unspecified: Secondary | ICD-10-CM | POA: Diagnosis present

## 2019-07-13 DIAGNOSIS — E1122 Type 2 diabetes mellitus with diabetic chronic kidney disease: Secondary | ICD-10-CM

## 2019-07-13 DIAGNOSIS — N183 Chronic kidney disease, stage 3 unspecified: Secondary | ICD-10-CM | POA: Diagnosis present

## 2019-07-13 DIAGNOSIS — I5032 Chronic diastolic (congestive) heart failure: Secondary | ICD-10-CM | POA: Diagnosis not present

## 2019-07-13 DIAGNOSIS — E039 Hypothyroidism, unspecified: Secondary | ICD-10-CM | POA: Diagnosis not present

## 2019-07-13 DIAGNOSIS — Z8249 Family history of ischemic heart disease and other diseases of the circulatory system: Secondary | ICD-10-CM

## 2019-07-13 DIAGNOSIS — J9621 Acute and chronic respiratory failure with hypoxia: Secondary | ICD-10-CM | POA: Diagnosis present

## 2019-07-13 DIAGNOSIS — E873 Alkalosis: Secondary | ICD-10-CM | POA: Diagnosis not present

## 2019-07-13 DIAGNOSIS — G629 Polyneuropathy, unspecified: Secondary | ICD-10-CM | POA: Diagnosis not present

## 2019-07-13 DIAGNOSIS — Z7982 Long term (current) use of aspirin: Secondary | ICD-10-CM

## 2019-07-13 DIAGNOSIS — I2510 Atherosclerotic heart disease of native coronary artery without angina pectoris: Secondary | ICD-10-CM | POA: Diagnosis not present

## 2019-07-13 DIAGNOSIS — I4510 Unspecified right bundle-branch block: Secondary | ICD-10-CM | POA: Diagnosis present

## 2019-07-13 DIAGNOSIS — K5904 Chronic idiopathic constipation: Secondary | ICD-10-CM | POA: Diagnosis not present

## 2019-07-13 DIAGNOSIS — N179 Acute kidney failure, unspecified: Secondary | ICD-10-CM | POA: Diagnosis not present

## 2019-07-13 DIAGNOSIS — Z79899 Other long term (current) drug therapy: Secondary | ICD-10-CM

## 2019-07-13 DIAGNOSIS — M4807 Spinal stenosis, lumbosacral region: Secondary | ICD-10-CM | POA: Diagnosis present

## 2019-07-13 DIAGNOSIS — I5031 Acute diastolic (congestive) heart failure: Secondary | ICD-10-CM | POA: Diagnosis not present

## 2019-07-13 DIAGNOSIS — Z87891 Personal history of nicotine dependence: Secondary | ICD-10-CM

## 2019-07-13 DIAGNOSIS — I130 Hypertensive heart and chronic kidney disease with heart failure and stage 1 through stage 4 chronic kidney disease, or unspecified chronic kidney disease: Secondary | ICD-10-CM | POA: Diagnosis not present

## 2019-07-13 DIAGNOSIS — J438 Other emphysema: Secondary | ICD-10-CM | POA: Diagnosis not present

## 2019-07-13 DIAGNOSIS — R079 Chest pain, unspecified: Secondary | ICD-10-CM | POA: Diagnosis not present

## 2019-07-13 DIAGNOSIS — Z9981 Dependence on supplemental oxygen: Secondary | ICD-10-CM

## 2019-07-13 DIAGNOSIS — Z79890 Hormone replacement therapy (postmenopausal): Secondary | ICD-10-CM

## 2019-07-13 DIAGNOSIS — Z9989 Dependence on other enabling machines and devices: Secondary | ICD-10-CM | POA: Diagnosis not present

## 2019-07-13 DIAGNOSIS — Z20828 Contact with and (suspected) exposure to other viral communicable diseases: Secondary | ICD-10-CM | POA: Diagnosis not present

## 2019-07-13 DIAGNOSIS — G4733 Obstructive sleep apnea (adult) (pediatric): Secondary | ICD-10-CM | POA: Diagnosis not present

## 2019-07-13 DIAGNOSIS — J9611 Chronic respiratory failure with hypoxia: Secondary | ICD-10-CM | POA: Diagnosis not present

## 2019-07-13 DIAGNOSIS — Z7951 Long term (current) use of inhaled steroids: Secondary | ICD-10-CM

## 2019-07-13 DIAGNOSIS — Z6839 Body mass index (BMI) 39.0-39.9, adult: Secondary | ICD-10-CM

## 2019-07-13 DIAGNOSIS — R0602 Shortness of breath: Secondary | ICD-10-CM | POA: Diagnosis not present

## 2019-07-13 LAB — CBC WITH DIFFERENTIAL/PLATELET
Abs Immature Granulocytes: 0.06 10*3/uL (ref 0.00–0.07)
Basophils Absolute: 0 10*3/uL (ref 0.0–0.1)
Basophils Relative: 0 %
Eosinophils Absolute: 0.1 10*3/uL (ref 0.0–0.5)
Eosinophils Relative: 1 %
HCT: 42.2 % (ref 39.0–52.0)
Hemoglobin: 13.1 g/dL (ref 13.0–17.0)
Immature Granulocytes: 1 %
Lymphocytes Relative: 11 %
Lymphs Abs: 0.9 10*3/uL (ref 0.7–4.0)
MCH: 29.2 pg (ref 26.0–34.0)
MCHC: 31 g/dL (ref 30.0–36.0)
MCV: 94.2 fL (ref 80.0–100.0)
Monocytes Absolute: 0.5 10*3/uL (ref 0.1–1.0)
Monocytes Relative: 7 %
Neutro Abs: 6.3 10*3/uL (ref 1.7–7.7)
Neutrophils Relative %: 80 %
Platelets: 242 10*3/uL (ref 150–400)
RBC: 4.48 MIL/uL (ref 4.22–5.81)
RDW: 15.9 % — ABNORMAL HIGH (ref 11.5–15.5)
WBC: 7.9 10*3/uL (ref 4.0–10.5)
nRBC: 0 % (ref 0.0–0.2)

## 2019-07-13 LAB — COMPREHENSIVE METABOLIC PANEL
ALT: 16 U/L (ref 0–44)
AST: 19 U/L (ref 15–41)
Albumin: 3.7 g/dL (ref 3.5–5.0)
Alkaline Phosphatase: 104 U/L (ref 38–126)
Anion gap: 15 (ref 5–15)
BUN: 63 mg/dL — ABNORMAL HIGH (ref 8–23)
CO2: 30 mmol/L (ref 22–32)
Calcium: 9.2 mg/dL (ref 8.9–10.3)
Chloride: 92 mmol/L — ABNORMAL LOW (ref 98–111)
Creatinine, Ser: 2 mg/dL — ABNORMAL HIGH (ref 0.61–1.24)
GFR calc Af Amer: 37 mL/min — ABNORMAL LOW (ref >60)
GFR calc non Af Amer: 32 mL/min — ABNORMAL LOW (ref >60)
Glucose, Bld: 287 mg/dL — ABNORMAL HIGH (ref 70–99)
Potassium: 4.4 mmol/L (ref 3.5–5.1)
Sodium: 137 mmol/L (ref 135–145)
Total Bilirubin: 0.5 mg/dL (ref 0.3–1.2)
Total Protein: 8.2 g/dL — ABNORMAL HIGH (ref 6.5–8.1)

## 2019-07-13 LAB — BRAIN NATRIURETIC PEPTIDE: B Natriuretic Peptide: 45.7 pg/mL (ref 0.0–100.0)

## 2019-07-13 LAB — SARS CORONAVIRUS 2 BY RT PCR (HOSPITAL ORDER, PERFORMED IN ~~LOC~~ HOSPITAL LAB): SARS Coronavirus 2: NEGATIVE

## 2019-07-13 MED ORDER — FESOTERODINE FUMARATE ER 8 MG PO TB24
8.0000 mg | ORAL_TABLET | Freq: Every day | ORAL | Status: DC
  Administered 2019-07-14 – 2019-07-18 (×5): 8 mg via ORAL
  Filled 2019-07-13 (×5): qty 1

## 2019-07-13 MED ORDER — ENOXAPARIN SODIUM 40 MG/0.4ML ~~LOC~~ SOLN
40.0000 mg | SUBCUTANEOUS | Status: DC
  Administered 2019-07-14 – 2019-07-17 (×4): 40 mg via SUBCUTANEOUS
  Filled 2019-07-13 (×4): qty 0.4

## 2019-07-13 MED ORDER — ACETAMINOPHEN 325 MG PO TABS: 650.0000 mg | ORAL_TABLET | Freq: Four times a day (QID) | ORAL | Status: DC | PRN

## 2019-07-13 MED ORDER — FUROSEMIDE 10 MG/ML IJ SOLN
80.0000 mg | Freq: Four times a day (QID) | INTRAMUSCULAR | Status: DC
  Administered 2019-07-13 – 2019-07-14 (×3): 80 mg via INTRAVENOUS
  Filled 2019-07-13 (×3): qty 8

## 2019-07-13 MED ORDER — ASPIRIN EC 81 MG PO TBEC
81.0000 mg | DELAYED_RELEASE_TABLET | Freq: Every day | ORAL | Status: DC
  Administered 2019-07-14 – 2019-07-17 (×4): 81 mg via ORAL
  Filled 2019-07-13 (×4): qty 1

## 2019-07-13 MED ORDER — INSULIN GLARGINE 100 UNIT/ML ~~LOC~~ SOLN
60.0000 [IU] | Freq: Every day | SUBCUTANEOUS | Status: DC
  Administered 2019-07-13: 60 [IU] via SUBCUTANEOUS
  Filled 2019-07-13 (×2): qty 0.6

## 2019-07-13 MED ORDER — HYDRALAZINE HCL 25 MG PO TABS
25.0000 mg | ORAL_TABLET | Freq: Three times a day (TID) | ORAL | Status: DC
  Administered 2019-07-13 – 2019-07-18 (×14): 25 mg via ORAL
  Filled 2019-07-13 (×15): qty 1

## 2019-07-13 MED ORDER — INSULIN ASPART 100 UNIT/ML ~~LOC~~ SOLN
18.0000 [IU] | Freq: Three times a day (TID) | SUBCUTANEOUS | Status: DC
  Administered 2019-07-14 (×2): 18 [IU] via SUBCUTANEOUS

## 2019-07-13 MED ORDER — METOLAZONE 5 MG PO TABS
2.5000 mg | ORAL_TABLET | ORAL | Status: DC
  Administered 2019-07-14 – 2019-07-15 (×2): 2.5 mg via ORAL
  Filled 2019-07-13 (×4): qty 1

## 2019-07-13 MED ORDER — ONDANSETRON HCL 4 MG PO TABS: 4.0000 mg | ORAL_TABLET | Freq: Four times a day (QID) | ORAL | Status: DC | PRN

## 2019-07-13 MED ORDER — ACETAMINOPHEN 650 MG RE SUPP: 650.0000 mg | Freq: Four times a day (QID) | RECTAL | Status: DC | PRN

## 2019-07-13 MED ORDER — LEVOTHYROXINE SODIUM 75 MCG PO TABS
175.0000 ug | ORAL_TABLET | Freq: Every day | ORAL | Status: DC
  Administered 2019-07-14 – 2019-07-18 (×5): 175 ug via ORAL
  Filled 2019-07-13 (×6): qty 1

## 2019-07-13 MED ORDER — ESCITALOPRAM OXALATE 10 MG PO TABS
20.0000 mg | ORAL_TABLET | Freq: Every day | ORAL | Status: DC
  Administered 2019-07-14 – 2019-07-18 (×5): 20 mg via ORAL
  Filled 2019-07-13 (×5): qty 2

## 2019-07-13 MED ORDER — GABAPENTIN 400 MG PO CAPS
400.0000 mg | ORAL_CAPSULE | Freq: Three times a day (TID) | ORAL | Status: DC
  Administered 2019-07-13 – 2019-07-18 (×14): 400 mg via ORAL
  Filled 2019-07-13 (×15): qty 1

## 2019-07-13 MED ORDER — ADULT MULTIVITAMIN W/MINERALS CH
1.0000 | ORAL_TABLET | Freq: Every day | ORAL | Status: DC
  Administered 2019-07-13 – 2019-07-17 (×5): 1 via ORAL
  Filled 2019-07-13 (×5): qty 1

## 2019-07-13 MED ORDER — PRAMIPEXOLE DIHYDROCHLORIDE 1 MG PO TABS
0.5000 mg | ORAL_TABLET | Freq: Two times a day (BID) | ORAL | Status: DC
  Administered 2019-07-13 – 2019-07-18 (×10): 0.5 mg via ORAL
  Filled 2019-07-13 (×11): qty 1

## 2019-07-13 MED ORDER — POLYETHYLENE GLYCOL 3350 17 G PO PACK
17.0000 g | PACK | Freq: Every day | ORAL | Status: DC
  Administered 2019-07-13 – 2019-07-16 (×2): 17 g via ORAL
  Filled 2019-07-13 (×5): qty 1

## 2019-07-13 MED ORDER — POTASSIUM CHLORIDE CRYS ER 20 MEQ PO TBCR
20.0000 meq | EXTENDED_RELEASE_TABLET | Freq: Two times a day (BID) | ORAL | Status: DC
  Administered 2019-07-13 – 2019-07-17 (×8): 20 meq via ORAL
  Filled 2019-07-13 (×8): qty 1

## 2019-07-13 MED ORDER — INSULIN ASPART 100 UNIT/ML ~~LOC~~ SOLN
0.0000 [IU] | Freq: Three times a day (TID) | SUBCUTANEOUS | Status: DC
  Administered 2019-07-14: 3 [IU] via SUBCUTANEOUS
  Administered 2019-07-14: 2 [IU] via SUBCUTANEOUS
  Administered 2019-07-15: 17:00:00 5 [IU] via SUBCUTANEOUS
  Administered 2019-07-15: 7 [IU] via SUBCUTANEOUS
  Administered 2019-07-15: 3 [IU] via SUBCUTANEOUS
  Administered 2019-07-16: 5 [IU] via SUBCUTANEOUS
  Administered 2019-07-16: 3 [IU] via SUBCUTANEOUS
  Administered 2019-07-16: 5 [IU] via SUBCUTANEOUS
  Administered 2019-07-17: 7 [IU] via SUBCUTANEOUS
  Administered 2019-07-17 (×2): 3 [IU] via SUBCUTANEOUS
  Administered 2019-07-18: 2 [IU] via SUBCUTANEOUS

## 2019-07-13 MED ORDER — FUROSEMIDE 10 MG/ML IJ SOLN
80.0000 mg | Freq: Once | INTRAMUSCULAR | Status: AC
  Administered 2019-07-13: 80 mg via INTRAVENOUS
  Filled 2019-07-13: qty 8

## 2019-07-13 MED ORDER — TAMSULOSIN HCL 0.4 MG PO CAPS
0.4000 mg | ORAL_CAPSULE | Freq: Every day | ORAL | Status: DC
  Administered 2019-07-13 – 2019-07-17 (×5): 0.4 mg via ORAL
  Filled 2019-07-13 (×5): qty 1

## 2019-07-13 MED ORDER — ATORVASTATIN CALCIUM 10 MG PO TABS
20.0000 mg | ORAL_TABLET | Freq: Every evening | ORAL | Status: DC
  Administered 2019-07-13 – 2019-07-17 (×5): 20 mg via ORAL
  Filled 2019-07-13 (×6): qty 2

## 2019-07-13 MED ORDER — ONDANSETRON HCL 4 MG/2ML IJ SOLN: 4.0000 mg | Freq: Four times a day (QID) | INTRAMUSCULAR | Status: DC | PRN

## 2019-07-13 MED ORDER — SPIRONOLACTONE 25 MG PO TABS
25.0000 mg | ORAL_TABLET | Freq: Every day | ORAL | Status: DC
  Administered 2019-07-14 – 2019-07-18 (×5): 25 mg via ORAL
  Filled 2019-07-13 (×5): qty 1

## 2019-07-13 NOTE — H&P (Signed)
History and Physical    Travis [name] OVZ:[id number] DOB: 1946-11-05 DOA: 07/13/2019  PCP: [name], MD  Patient coming from: Home.  History obtained from patient as well as patient's daughter patient and previous records.  Chief Complaint: Shortness of breath.  Lower extremity edema.  HPI: Travis [name] is a 73 y.o. male with history of diastolic CHF last EF measured in May 2020 was 43 to 65% with history of diabetes mellitus type 2, sleep apnea, COPD, chronic kidney disease has been experiencing increasing shortness of breath lower extremity mild for the last 2 weeks despite increasing dose of metolazone.  Denies any chest pain productive cough fever chills.  Has been compliant with his medications.  ED Course: In the ER patient was hypoxic requiring 6 L oxygen.  Patient's chest x-ray was unremarkable EKG shows normal sinus rhythm with RBBB.  On exam patient has significant peripheral edema and is hypoxic.  Was given Lasix 80 mg IV and admitted for acute on chronic diastolic CHF.  Other labs reveal creatinine of 2 hemoglobin 13 platelets 242 BNP 45.7.  COVID test was negative.  Review of Systems: As per HPI, rest all negative.   Past Medical History:  Diagnosis Date  . Cervical myelopathy (HCC)    with myelomalacia at C5-6 and C3-4  . CHF (congestive heart failure) ([place])   . Constipation - functional    controlled with miralax  . COPD (chronic obstructive pulmonary disease) (HCC)    mild  . DJD (degenerative joint disease)    WHOLE SPINE  . Dyspnea   . Gait disorder   . Heart murmur    years ago   . Hypertension   . Hypothyroidism    Low d/t treatment of hyperthyroidism  . Lumbosacral spinal stenosis 03/13/2014  . On home oxygen therapy    "2.5L all the time" (06/29/2018)  . Peripheral neuropathy   . Pneumonia   . PONV (postoperative nausea and vomiting)   . RBBB   . Renal disorder    acute kidney failure  . Restless legs syndrome 03/14/2015  . RLS (restless  legs syndrome)   . Sleep apnea    DX  2009/2010 [facility] done at [facility]  . 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 Type II diabetes mellitus ([place])    diet controlled    Past Surgical History:  Procedure Laterality Date  . BACK SURGERY     6 surgeries  . CARDIAC CATHETERIZATION [name] 09/07/2015   Procedure: Right/Left Heart Cath and Coronary Angiography;  Surgeon: Peter M Martinique, MD;  Location: [place] CV LAB;  Service: Cardiovascular;  Laterality: [name];  . CHOLECYSTECTOMY    . EYE SURGERY     BIL CATARACTS  . LUMBAR LAMINECTOMY/DECOMPRESSION MICRODISCECTOMY  07/14/2012   Procedure: LUMBAR LAMINECTOMY/DECOMPRESSION MICRODISCECTOMY 2 LEVELS;  Surgeon: Eustace Moore, MD;  Location: [place] NEURO ORS;  Service: Neurosurgery;  Laterality: Left;  Lumbar two three laminectomy, left thoracic four five transpedicular diskectomy  . POSTERIOR CERVICAL FUSION/FORAMINOTOMY  04/08/2018   Procedure: Laminectomy and Foraminotomy - Lumbar Four-Lumbar Five, instrumented posterolateral fusion Lumbar Four- Five, Laminectomy and Foraminotomy - Thoracic One-Thoracic Two;  Surgeon: Eustace Moore, MD;  Location: [facility];  Service: Neurosurgery;;  posterior  . POSTERIOR LUMBAR FUSION  04/08/2018   Laminectomy and Foraminotomy - Lumbar Four-Lumbar Five, instrumented posterolateral fusion Lumbar Four- Five, Laminectomy and Foraminotomy - Thoracic One-Thoracic Two  . POSTERIOR LUMBAR FUSION  04/08/2018   Laminectomy and Foraminotomy -  Lumbar Four-Lumbar Five, instrumented posterolateral fusion Lumbar Four- Five, Laminectomy and Foraminotomy - Thoracic One-Thoracic Two     reports that he quit smoking about 34 years ago. His smoking use included cigarettes. He has a 40.00 pack-year smoking history. He quit smokeless tobacco use about 34 years ago. He reports that he does not drink alcohol or use drugs.  Allergies  Allergen Reactions  . Amlodipine Swelling and Other (See Comments)    Causes edema (per [facility]  paperwork)  . Plendil [Felodipine] Swelling and Other (See Comments)    Causes edema (per [facility] paperwork)  . Compazine [Prochlorperazine] Other (See Comments)    Caused agitation (per [facility] paperwork)  . Cymbalta [Duloxetine Hcl] Other (See Comments)    Made the patient "feel weird," per [facility] paperwork  . Jardiance [Empagliflozin] Other (See Comments)    Stopped by MD because of unwanted side effects  . Other Nausea Only and Other (See Comments)    ANESTHESIA UNSPECIFIED- makes sick, must have anti-nausea medication in advance  . Levofloxacin In D5w Itching    Possible infusion reaction 03/29/17. Tolerating oral Levaquin 03/30/17  . Septra [Sulfamethoxazole-Trimethoprim] Nausea Only and Other (See Comments)    Per [facility] paperwork    Family History  Problem Relation Age of Onset  . Polycythemia Mother   . Diabetes Father   . Hypertension Father   . Heart disease Father   . CAD Other        1 of his 4 brothers and father has CAD    Prior to Admission medications   Medication Sig Start Date End Date Taking? Authorizing Provider  aspirin EC 81 MG tablet Take 81 mg by mouth daily.   Yes [provider]  atorvastatin (LIPITOR) 20 MG tablet Take 20 mg by mouth every evening.    Yes [provider]  Blood Glucose Monitoring Suppl (ONETOUCH VERIO) w/Device KIT 1 kit by Does not apply route 4 (four) times daily. Use as directed to test blood sugars 4 times daily DX E11.42 03/04/19  Yes Shamleffer, Melanie Crazier, MD  Continuous Blood Gluc Receiver (FREESTYLE LIBRE 14 DAY READER) DEVI 1 kit by Does not apply route 3 (three) times daily. 02/07/19  Yes Shamleffer, Melanie Crazier, MD  Continuous Blood Gluc Sensor (FREESTYLE LIBRE 14 DAY SENSOR) MISC 1 kit by Does not apply route 3 (three) times daily. 02/07/19  Yes Shamleffer, Melanie Crazier, MD  escitalopram (LEXAPRO) 20 MG tablet Take 20 mg by mouth daily.   Yes [provider]   gabapentin (NEURONTIN) 400 MG capsule Take 400 mg by mouth 3 (three) times daily.   Yes [provider]  glucose blood test strip Use as instructed to test blood sugars 4 times daily DX E11.42 03/04/19  Yes Shamleffer, Melanie Crazier, MD  hydrALAZINE (APRESOLINE) 25 MG tablet Take 25 mg by mouth 3 (three) times daily.   Yes [provider]  insulin aspart (NOVOLOG FLEXPEN) 100 UNIT/ML FlexPen Inject 18 Units into the skin 3 (three) times daily with meals. 03/07/19  Yes Shamleffer, Melanie Crazier, MD  Insulin Glargine (LANTUS SOLOSTAR) 100 UNIT/ML Solostar Pen Inject 60 Units into the skin at bedtime. 03/04/19  Yes Shamleffer, Melanie Crazier, MD  Insulin Pen Needle (BD PEN NEEDLE MICRO U/F) 32G X 6 MM MISC 4 times a day 03/04/19  Yes Shamleffer, Melanie Crazier, MD  Lancets [facility] ULTRASOFT) lancets Use as instructed to test blood sugars 4 times daily DX E11.42 03/04/19  Yes Shamleffer, Mammie Lorenzo  Amedeo Kinsman, MD  levothyroxine (SYNTHROID, LEVOTHROID) 175 MCG tablet Take 1 tablet (175 mcg total) by mouth daily. 05/12/18  Yes Angiulli, Lavon Paganini, [name]  metolazone (ZAROXOLYN) 2.5 MG tablet Take 1 tablet (2.5 mg total) by mouth 2 (two) times a week. Monday AND FRIDAY Patient taking differently: Take 2.5 mg by mouth once a week. FRIDAY 07/14/19  Yes Bensimhon, Shaune Pascal, MD  multivitamin (ONE-A-DAY MEN'S) TABS tablet Take 1 tablet by mouth at bedtime.   Yes [provider]  OXYGEN Inhale 2-3 Units into the lungs at bedtime.   Yes [provider]  polyethylene glycol (MIRALAX / GLYCOLAX) packet Take 17 g by mouth daily as needed. For constipation Patient taking differently: Take 17 g by mouth at bedtime.  08/09/14  Yes Love, Ivan Anchors, [name]  potassium chloride SA (K-DUR) 20 MEQ tablet Take 1 tablet (20 mEq total) by mouth 2 (two) times daily. Take 1 tab only on days you take Metolazone 05/11/19  Yes Gonfa, Charlesetta Ivory, MD  pramipexole (MIRAPEX) 0.5 MG tablet One tablet in the morning and 2 in  the evening Patient taking differently: Take 0.5 mg by mouth 2 (two) times daily.  11/24/18  Yes Kathrynn Ducking, MD  spironolactone (ALDACTONE) 25 MG tablet Take 1 tablet (25 mg total) by mouth daily. 10/29/18  Yes Nahser, Wonda Cheng, MD  tamsulosin (FLOMAX) 0.4 MG CAPS capsule Take 1 capsule (0.4 mg total) by mouth daily. Patient taking differently: Take 0.4 mg by mouth at bedtime.  05/12/18  Yes Angiulli, Lavon Paganini, [name]  torsemide (DEMADEX) 20 MG tablet Take 4 tablets (80 mg total) by mouth 2 (two) times daily. 05/18/19  Yes Larey Dresser, MD  TOVIAZ 8 MG TB24 tablet Take 1 tablet (8 mg total) by mouth daily. 05/12/18  Yes Angiulli, Lavon Paganini, [name]  Insulin Pen Needle (BD PEN NEEDLE MICRO U/F) 32G X 6 MM MISC Four times daily 02/04/19   Shamleffer, Melanie Crazier, MD  ketoconazole (NIZORAL) 2 % cream Apply 1 application topically daily. Patient not taking: Reported on 07/13/2019 12/03/18   Shirley Friar, [name]    Physical Exam: Constitutional: Moderately built and nourished. Vitals:   07/13/19 1930 07/13/19 2000 07/13/19 2030 07/13/19 2100  BP: (!) 157/61 (!) 144/67 (!) 145/67 (!) 128/58  Pulse: 88 84 82 83  Resp: (!) 27 (!) 35 (!) 31 (!) 24  Temp:      TempSrc:      SpO2: 93% 95% 95% 94%   Eyes: Anicteric no pallor. ENMT: No discharge from the ears eyes nose or mouth. Neck: No mass or.  No neck rigidity. Respiratory: No rhonchi or crepitations. Cardiovascular: S1-S2 heard. Abdomen: Soft nontender bowel sounds present pulmonary distended. Musculoskeletal: Bilateral compression stockings. Skin: Bilateral lower extremity compression stockings. Neurologic: Alert awake oriented to time place and person.  Moves all extremities. Psychiatric: Appears normal per normal affect.   Labs on Admission: I have personally reviewed following labs and imaging studies  CBC: Recent Labs  Lab 07/13/19 1845  WBC 7.9  NEUTROABS 6.3  HGB 13.1  HCT 42.2  MCV 94.2  PLT 161   Basic  Metabolic Panel: Recent Labs  Lab 07/13/19 1845  NA 137  K 4.4  CL 92*  CO2 30  GLUCOSE 287*  BUN 63*  CREATININE 2.00*  CALCIUM 9.2   GFR: CrCl cannot be calculated (Unknown ideal weight.). Liver Function Tests: Recent Labs  Lab 07/13/19 1845  AST 19  ALT 16  ALKPHOS 104  BILITOT  0.5  PROT 8.2*  ALBUMIN 3.7   No results for input(s): LIPASE, AMYLASE in the last 168 hours. No results for input(s): AMMONIA in the last 168 hours. Coagulation Profile: No results for input(s): INR, PROTIME in the last 168 hours. Cardiac Enzymes: No results for input(s): CKTOTAL, CKMB, CKMBINDEX, TROPONINI in the last 168 hours. BNP (last 3 results) No results for input(s): PROBNP in the last 8760 hours. HbA1C: No results for input(s): HGBA1C in the last 72 hours. CBG: No results for input(s): GLUCAP in the last 168 hours. Lipid Profile: No results for input(s): CHOL, HDL, LDLCALC, TRIG, CHOLHDL, LDLDIRECT in the last 72 hours. Thyroid Function Tests: No results for input(s): TSH, T4TOTAL, FREET4, T3FREE, THYROIDAB in the last 72 hours. Anemia Panel: No results for input(s): VITAMINB12, FOLATE, FERRITIN, TIBC, IRON, RETICCTPCT in the last 72 hours. Urine analysis:    Component Value Date/Time   COLORURINE STRAW (A) 05/01/2019 0258   APPEARANCEUR CLEAR 05/01/2019 0258   LABSPEC 1.009 05/01/2019 0258   PHURINE 5.0 05/01/2019 0258   GLUCOSEU 150 (A) 05/01/2019 0258   HGBUR NEGATIVE 05/01/2019 0258   BILIRUBINUR NEGATIVE 05/01/2019 0258   KETONESUR NEGATIVE 05/01/2019 0258   PROTEINUR NEGATIVE 05/01/2019 0258   UROBILINOGEN 0.2 08/22/2015 1820   NITRITE NEGATIVE 05/01/2019 0258   LEUKOCYTESUR MODERATE (A) 05/01/2019 0258   Sepsis Labs: @LABRCNTIP (procalcitonin:4,lacticidven:4) )No results found for this or any previous visit (from the past 240 hour(s)).   Radiological Exams on Admission: Dg Chest Port 1 View  Result Date: 07/13/2019 CLINICAL DATA:  73 y/o M; 2 days of  shortness of breath. Patient denies chest pain. Former smoker. EXAM: PORTABLE CHEST 1 VIEW COMPARISON:  05/01/2019 chest radiograph. FINDINGS: Stable cardiac silhouette partially obscured by elevated hemidiaphragms. Aortic atherosclerosis with calcification. Low lung volumes accentuate pulmonary markings. Platelike atelectasis in the lung bases. No visible effusion. No pneumothorax. No acute osseous abnormality is evident. Midthoracic fusion hardware noted. IMPRESSION: Low lung volumes. Platelike atelectasis in the lung bases. Aortic atherosclerosis. Electronically Signed   By: Kristine Garbe M.D.   On: 07/13/2019 19:34    EKG: Independently reviewed.  Normal sinus rhythm with RBBB.  Assessment/Plan Principal Problem:   Acute on chronic diastolic CHF (congestive heart failure) (HCC) Active Problems:   OSA on CPAP   Restless legs syndrome   Hypothyroid   Stage 3 chronic kidney disease (HCC)   Type 2 diabetes mellitus with stage 3 chronic kidney disease, with long-term current use of insulin (HCC)   COPD (chronic obstructive pulmonary disease) (HCC)   Acute CHF (congestive heart failure) ([place])    1. Acute on chronic diastolic CHF last EF measured in May 2020 was 60 to 65%.  Patient is on Lasix 80 mg IV every 6 hourly for now.  On metolazone and spironolactone.  Closely follow intake output metabolic panel daily weights.  Cardiology notified. 2. Sleep apnea on CPAP.  We will continue to call with test is negative. 3. Diabetes mellitus type 2 Lantus 60 units at bedtime. 4. Chronic kidney disease stage III creatinine appears to be at baseline. 5. Hyperlipidemia on statins. 6. Depression on Lexapro. 7. Hypertension on hydralazine. 8. Restless leg syndrome on pramipexole.  Given the complexity of patient's condition including significant anasarca shortness of breath acute respiratory failure with other comorbid condition patient will require inpatient status.   DVT prophylaxis:  Lovenox. Code Status: Full code. Family Communication: Discussed with patient's wife and daughter. Disposition Plan: Home. Consults called: Cardiology. Admission status: Inpatient.  Rise Patience MD [facility] Pager [phone].  If 7PM-7AM, please contact night-coverage [email or web address] Password [facility]  07/13/2019, 9:37 PM

## 2019-07-13 NOTE — ED Notes (Signed)
ED TO INPATIENT HANDOFF REPORT  ED Nurse Name and Phone #:  [phone]  S Name/Age/Gender Travis [name] 73 y.o. male Room/Bed: 022C/022C  Code Status   Code Status: Prior  Home/SNF/Other Home Patient oriented to: self, place, time and situation Is this baseline? Yes   Triage Complete: Triage complete  Chief Complaint Leg swelling  Triage Note Pt here from home with c/o sob , pt wears cpap at night ,sats 71 % on room air , pt placed on 6 liters , pt sats up to 95 %   Allergies Allergies  Allergen Reactions  . Amlodipine Swelling and Other (See Comments)    Causes edema (per [facility] paperwork)  . Plendil [Felodipine] Swelling and Other (See Comments)    Causes edema (per [facility] paperwork)  . Compazine [Prochlorperazine] Other (See Comments)    Caused agitation (per [facility] paperwork)  . Cymbalta [Duloxetine Hcl] Other (See Comments)    Made the patient "feel weird," per [facility] paperwork  . Jardiance [Empagliflozin] Other (See Comments)    Stopped by MD because of unwanted side effects  . Other Nausea Only and Other (See Comments)    ANESTHESIA UNSPECIFIED- makes sick, must have anti-nausea medication in advance  . Levofloxacin In D5w Itching    Possible infusion reaction 03/29/17. Tolerating oral Levaquin 03/30/17  . Septra [Sulfamethoxazole-Trimethoprim] Nausea Only and Other (See Comments)    Per [facility] paperwork    Level of Care/Admitting Diagnosis ED Disposition    ED Disposition Condition [place]: [place] [[id number]]  Level of Care: Progressive [102]  Covid Evaluation: Asymptomatic Screening Protocol (No Symptoms)  Diagnosis: Acute CHF (congestive heart failure) [facility]) [[id number]]  Admitting Physician: Rise Patience [phone]  Attending Physician: Rise Patience [phone]  Estimated length of stay: past midnight tomorrow  Certification:: I certify this patient will need  inpatient services for at least 2 midnights  PT Class (Do Not Modify): Inpatient [101]  PT Acc Code (Do Not Modify): Private [1]       B Medical/Surgery History Past Medical History:  Diagnosis Date  . Cervical myelopathy (HCC)    with myelomalacia at C5-6 and C3-4  . CHF (congestive heart failure) ([place])   . Constipation - functional    controlled with miralax  . COPD (chronic obstructive pulmonary disease) (HCC)    mild  . DJD (degenerative joint disease)    WHOLE SPINE  . Dyspnea   . Gait disorder   . Heart murmur    years ago   . Hypertension   . Hypothyroidism    Low d/t treatment of hyperthyroidism  . Lumbosacral spinal stenosis 03/13/2014  . On home oxygen therapy    "2.5L all the time" (06/29/2018)  . Peripheral neuropathy   . Pneumonia   . PONV (postoperative nausea and vomiting)   . RBBB   . Renal disorder    acute kidney failure  . Restless legs syndrome 03/14/2015  . RLS (restless legs syndrome)   . Sleep apnea    DX  2009/2010 study done at [facility]  . 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 Type II diabetes mellitus ([place])    diet controlled   Past Surgical History:  Procedure Laterality Date  . BACK SURGERY     6 surgeries  . CARDIAC CATHETERIZATION [name] 09/07/2015   Procedure: Right/Left Heart Cath and Coronary Angiography;  Surgeon: Peter M Martinique, MD;  Location: [place]  CV LAB;  Service: Cardiovascular;  Laterality: [name];  . CHOLECYSTECTOMY    . EYE SURGERY     BIL CATARACTS  . LUMBAR LAMINECTOMY/DECOMPRESSION MICRODISCECTOMY  07/14/2012   Procedure: LUMBAR LAMINECTOMY/DECOMPRESSION MICRODISCECTOMY 2 LEVELS;  Surgeon: Eustace Moore, MD;  Location: [place] NEURO ORS;  Service: Neurosurgery;  Laterality: Left;  Lumbar two three laminectomy, left thoracic four five transpedicular diskectomy  . POSTERIOR CERVICAL FUSION/FORAMINOTOMY  04/08/2018   Procedure: Laminectomy and Foraminotomy - Lumbar Four-Lumbar Five, instrumented posterolateral fusion Lumbar Four-  Five, Laminectomy and Foraminotomy - Thoracic One-Thoracic Two;  Surgeon: Eustace Moore, MD;  Location: [facility] OR;  Service: Neurosurgery;;  posterior  . POSTERIOR LUMBAR FUSION  04/08/2018   Laminectomy and Foraminotomy - Lumbar Four-Lumbar Five, instrumented posterolateral fusion Lumbar Four- Five, Laminectomy and Foraminotomy - Thoracic One-Thoracic Two  . POSTERIOR LUMBAR FUSION  04/08/2018   Laminectomy and Foraminotomy - Lumbar Four-Lumbar Five, instrumented posterolateral fusion Lumbar Four- Five, Laminectomy and Foraminotomy - Thoracic One-Thoracic Two     A IV Location/Drains/Wounds Patient Lines/Drains/Airways Status   Active Line/Drains/Airways    Name:   Placement date:   Placement time:   Site:   Days:   Peripheral IV 07/13/19 Right Antecubital   07/13/19    1857    Antecubital   less than 1   External Urinary Catheter   05/04/19    1534    -   70   Incision (Closed) 04/08/18 Back Other (Comment)   04/08/18    1128     461   Incision (Closed) 04/08/18 Back   04/08/18    1308     461   Pressure Ulcer 07/27/14 Stage II -  Partial thickness loss of dermis presenting as a shallow open ulcer with a red, pink wound bed without slough.   07/27/14    1000     1812   Pressure Injury 07/02/18   07/02/18    1900     376   Pressure Injury 11/21/18 Stage III -  Full thickness tissue loss. Subcutaneous fat may be visible but bone, tendon or muscle are NOT exposed. Full thicknes small open area to sacrum.   11/21/18    0005     234   Pressure Injury 05/01/19 Stage II -  Partial thickness loss of dermis presenting as a shallow open ulcer with a red, pink wound bed without slough.   05/01/19    0615     73   Wound / Incision (Open or Dehisced) 06/28/18 Other (Comment) Other (Comment)   06/28/18    0621    Other (Comment)   380          Intake/Output Last 24 hours No intake or output data in the 24 hours ending 07/13/19 2026  Labs/Imaging Results for orders placed or performed during the  hospital encounter of 07/13/19 (from the past 48 hour(s))  CBC with Differential/Platelet     Status: Abnormal   Collection Time: 07/13/19  6:45 PM  Result Value Ref Range   WBC 7.9 4.0 - 10.5 K/uL   RBC 4.48 4.22 - 5.81 MIL/uL   Hemoglobin 13.1 13.0 - 17.0 g/dL   HCT 42.2 39.0 - 52.0 %   MCV 94.2 80.0 - 100.0 fL   MCH 29.2 26.0 - 34.0 pg   MCHC 31.0 30.0 - 36.0 g/dL   RDW 15.9 (H) 11.5 - 15.5 %   Platelets 242 150 - 400 K/uL   nRBC 0.0 0.0 - 0.2 %  Neutrophils Relative % 80 %   Neutro Abs 6.3 1.7 - 7.7 K/uL   Lymphocytes Relative 11 %   Lymphs Abs 0.9 0.7 - 4.0 K/uL   Monocytes Relative 7 %   Monocytes Absolute 0.5 0.1 - 1.0 K/uL   Eosinophils Relative 1 %   Eosinophils Absolute 0.1 0.0 - 0.5 K/uL   Basophils Relative 0 %   Basophils Absolute 0.0 0.0 - 0.1 K/uL   Immature Granulocytes 1 %   Abs Immature Granulocytes 0.06 0.00 - 0.07 K/uL    Comment: Performed at [facility] [address] [place], [place] [address]  Comprehensive metabolic panel     Status: Abnormal   Collection Time: 07/13/19  6:45 PM  Result Value Ref Range   Sodium 137 135 - 145 mmol/L   Potassium 4.4 3.5 - 5.1 mmol/L   Chloride 92 (L) 98 - 111 mmol/L   CO2 30 22 - 32 mmol/L   Glucose, Bld 287 (H) 70 - 99 mg/dL   BUN 63 (H) 8 - 23 mg/dL   Creatinine, Ser 2.00 (H) 0.61 - 1.24 mg/dL   Calcium 9.2 8.9 - 10.3 mg/dL   Total Protein 8.2 (H) 6.5 - 8.1 g/dL   Albumin 3.7 3.5 - 5.0 g/dL   AST 19 15 - 41 U/L   ALT 16 0 - 44 U/L   Alkaline Phosphatase 104 38 - 126 U/L   Total Bilirubin 0.5 0.3 - 1.2 mg/dL   GFR calc non Af Amer 32 (L) >60 mL/min   GFR calc Af Amer 37 (L) >60 mL/min   Anion gap 15 5 - 15    Comment: Performed at [facility] [address] [place], [place] [address]  Brain natriuretic peptide     Status: None   Collection Time: 07/13/19  6:45 PM  Result Value Ref Range   B Natriuretic Peptide 45.7 0.0 - 100.0 pg/mL    Comment: Performed at [facility]  [address] [place], [place] [address]   Dg Chest Port 1 View  Result Date: 07/13/2019 CLINICAL DATA:  73 y/o M; 2 days of shortness of breath. Patient denies chest pain. Former smoker. EXAM: PORTABLE CHEST 1 VIEW COMPARISON:  05/01/2019 chest radiograph. FINDINGS: Stable cardiac silhouette partially obscured by elevated hemidiaphragms. Aortic atherosclerosis with calcification. Low lung volumes accentuate pulmonary markings. Platelike atelectasis in the lung bases. No visible effusion. No pneumothorax. No acute osseous abnormality is evident. Midthoracic fusion hardware noted. IMPRESSION: Low lung volumes. Platelike atelectasis in the lung bases. Aortic atherosclerosis. Electronically Signed   By: Kristine Garbe M.D.   On: 07/13/2019 19:34    Pending Labs Unresulted Labs (From admission, onward)    Start     Ordered   07/13/19 1846  Novel Coronavirus,NAA,(SEND-OUT TO REF LAB - TAT 24-48 hrs); Hosp Order  (Asymptomatic Patients Labs)  Once,   STAT    Question:  Rule Out  Answer:  Yes   07/13/19 1845          Vitals/Pain Today's Vitals   07/13/19 1828 07/13/19 1846 07/13/19 1900 07/13/19 1931  BP: (!) 159/94  (!) 160/63   Pulse: 97  89   Resp: 20  (!) 43   Temp: 98 F (36.7 C)     TempSrc: Oral     SpO2: 97%  95%   PainSc:  0-No pain  0-No pain    Isolation Precautions No active isolations  Medications Medications  furosemide (LASIX) injection  80 mg (80 mg Intravenous Given 07/13/19 1937)    Mobility power wheelchair High fall risk   Focused Assessments Cardiac Assessment Handoff:  Cardiac Rhythm: Normal sinus rhythm Lab Results  Component Value Date   CKTOTAL 1,156 (H) 07/12/2014   CKMB (HH) 05/09/2010    6.5 REPEATED TO VERIFY CRITICAL RESULT CALLED TO, READ BACK BY AND VERIFIED WITH: S HODGE,RN,1816,05/09/10,D BRADLEY   TROPONINI <0.03 05/01/2019   Lab Results  Component Value Date   DDIMER 0.91 (H) 05/01/2019   Does the Patient currently have chest pain? No      R Recommendations: See Admitting Provider Note  Report given to:   Additional Notes:

## 2019-07-13 NOTE — Telephone Encounter (Signed)
Patients wife called to report she will be bring patient into the ER for futrher evaluation of SOB - reports patient was very uncomfortable despite medication increase -reports patient is very SOB and swollen  advised of ER/hospital visitor restrictions due to [place]  Wife voiced understanding

## 2019-07-13 NOTE — ED Triage Notes (Signed)
Pt here from home with c/o sob , pt wears cpap at night ,sats 71 % on room air , pt placed on 6 liters , pt sats up to 95 %

## 2019-07-13 NOTE — ED Provider Notes (Signed)
[facility] EMERGENCY DEPARTMENT Provider Note   CSN: [id number] Arrival date & time: 07/13/19  1826     History   Chief Complaint Chief Complaint  Patient presents with  . Shortness of Breath    HPI Travis [name] is a 73 y.o. male history of CHF with diastolic dysfunction, COPD, on home O2 as needed here presenting with shortness of breath, increasing oxygen requirement.  He lives at home with family and is wheelchair-bound at baseline.  He wears oxygen as needed at night.  The last week or so, family noticed diffuse edema and subjective we came.  He also had to be on oxygen 3 L all the time instead of as needed.  They have been talking to his cardiologist and he was instructed to take an extra dose of his metolazone and has been on torsemide 80 mg twice daily.  Patient had admission for CHF exacerbation about a month and a half ago for similar symptoms.  Patient was apparently hypoxic to 70% on room air at home.      The history is provided by the patient.    Past Medical History:  Diagnosis Date  . Cervical myelopathy (HCC)    with myelomalacia at C5-6 and C3-4  . CHF (congestive heart failure) ([place])   . Constipation - functional    controlled with miralax  . COPD (chronic obstructive pulmonary disease) (HCC)    mild  . DJD (degenerative joint disease)    WHOLE SPINE  . Dyspnea   . Gait disorder   . Heart murmur    years ago   . Hypertension   . Hypothyroidism    Low d/t treatment of hyperthyroidism  . Lumbosacral spinal stenosis 03/13/2014  . On home oxygen therapy    "2.5L all the time" (06/29/2018)  . Peripheral neuropathy   . Pneumonia   . PONV (postoperative nausea and vomiting)   . RBBB   . Renal disorder    acute kidney failure  . Restless legs syndrome 03/14/2015  . RLS (restless legs syndrome)   . Sleep apnea    DX  2009/2010 study done at [facility]  . 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 Type II diabetes mellitus ([place])    diet  controlled    Patient Active Problem List   Diagnosis Date Noted  . Anasarca 05/01/2019  . Cellulitis of lower extremity 05/01/2019  . COPD (chronic obstructive pulmonary disease) ([place]) 05/01/2019  . Depression 05/01/2019  . RLS (restless legs syndrome) 05/01/2019  . Type 2 diabetes mellitus with stage 3 chronic kidney disease, with long-term current use of insulin ([place]) 04/15/2019  . Insulin dependent diabetes mellitus ([place]) 04/15/2019  . Pressure injury of skin 11/21/2018  . Complicated urinary tract infection 11/20/2018  . Acute encephalopathy 06/28/2018  . Oxygen desaturation   . Supplemental oxygen dependent   . Type 2 diabetes mellitus with hyperglycemia, with long-term current use of insulin ([place])   . Labile blood pressure   . Labile blood glucose   . Myelopathy ([place]) 04/16/2018  . Benign essential HTN   . Benign prostatic hyperplasia with urinary retention   . Acute on chronic respiratory failure with hypoxia and hypercapnia (HCC)   . DDD (degenerative disc disease), cervical   . Obesity, Class III, BMI 40-49.9 (morbid obesity) ([place])   . S/P lumbar spinal fusion 04/08/2018  . 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 Myelopathy, spondylogenic, cervical 11/11/2017  . Excessive daytime  sleepiness 11/11/2017  . 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 Debility 10/08/2017  . Stage 3 chronic kidney disease ([place])   . Spinal stenosis of lumbar region   . Acute blood loss anemia   . Urinary tract infection without hematuria   . Acute on chronic diastolic CHF (congestive heart failure) ([place])   . Elevated troponin I level   . Hypothyroid 08/19/2015  . Restless legs syndrome 03/14/2015  . Shortness of breath 03/09/2015  . Sinus bradycardia 09/21/2014  . Thoracic spondylosis with myelopathy 09/13/2014  . Cervical arthritis with myelopathy 07/18/2014  . Constipation - functional   . Sepsis ([place]) 07/10/2014  . OSA on CPAP 07/10/2014   . Edema leg 07/10/2014  . Lumbosacral spinal stenosis 03/13/2014  . Other vitamin B12 deficiency anemia 07/13/2013  . 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 Polyneuropathy in other diseases classified elsewhere ([place]) 02/17/2013  . Other general symptoms(780.99) 02/17/2013  . Abnormality of gait 02/17/2013  . Cervical spondylosis with myelopathy 02/17/2013  . Back ache 09/15/2012    Past Surgical History:  Procedure Laterality Date  . BACK SURGERY     6 surgeries  . CARDIAC CATHETERIZATION [name] 09/07/2015   Procedure: Right/Left Heart Cath and Coronary Angiography;  Surgeon: Peter M Martinique, MD;  Location: [facility];  Service: Cardiovascular;  Laterality: [name];  . CHOLECYSTECTOMY    . EYE SURGERY     BIL CATARACTS  . LUMBAR LAMINECTOMY/DECOMPRESSION MICRODISCECTOMY  07/14/2012   Procedure: LUMBAR LAMINECTOMY/DECOMPRESSION MICRODISCECTOMY 2 LEVELS;  Surgeon: Eustace Moore, MD;  Location: [facility];  Service: Neurosurgery;  Laterality: Left;  Lumbar two three laminectomy, left thoracic four five transpedicular diskectomy  . POSTERIOR CERVICAL FUSION/FORAMINOTOMY  04/08/2018   Procedure: Laminectomy and Foraminotomy - Lumbar Four-Lumbar Five, instrumented posterolateral fusion Lumbar Four- Five, Laminectomy and Foraminotomy - Thoracic One-Thoracic Two;  Surgeon: Eustace Moore, MD;  Location: [facility];  Service: Neurosurgery;;  posterior  . POSTERIOR LUMBAR FUSION  04/08/2018   Laminectomy and Foraminotomy - Lumbar Four-Lumbar Five, instrumented posterolateral fusion Lumbar Four- Five, Laminectomy and Foraminotomy - Thoracic One-Thoracic Two  . POSTERIOR LUMBAR FUSION  04/08/2018   Laminectomy and Foraminotomy - Lumbar Four-Lumbar Five, instrumented posterolateral fusion Lumbar Four- Five, Laminectomy and Foraminotomy - Thoracic One-Thoracic Two        Home Medications    Prior to Admission medications   Medication Sig Start Date End Date Taking?  Authorizing Provider  atorvastatin (LIPITOR) 20 MG tablet Take 20 mg by mouth daily at 6 PM.    [provider]  Blood Glucose Monitoring Suppl (ONETOUCH VERIO) w/Device KIT 1 kit by Does not apply route 4 (four) times daily. Use as directed to test blood sugars 4 times daily DX E11.42 03/04/19   Shamleffer, Melanie Crazier, MD  Continuous Blood Gluc Receiver (FREESTYLE LIBRE 14 DAY READER) DEVI 1 kit by Does not apply route 3 (three) times daily. 02/07/19   Shamleffer, Melanie Crazier, MD  Continuous Blood Gluc Sensor (FREESTYLE LIBRE 14 DAY SENSOR) MISC 1 kit by Does not apply route 3 (three) times daily. 02/07/19   Shamleffer, Melanie Crazier, MD  escitalopram (LEXAPRO) 20 MG tablet Take 20 mg by mouth daily.    [provider]  gabapentin (NEURONTIN) 400 MG capsule Take 400 mg by mouth 3 (three) times daily.    [provider]  glucose blood test strip Use as instructed to test blood sugars 4 times daily DX E11.42  03/04/19   Shamleffer, Melanie Crazier, MD  hydrALAZINE (APRESOLINE) 25 MG tablet Take 25 mg by mouth 3 (three) times daily.    [provider]  insulin aspart (NOVOLOG FLEXPEN) 100 UNIT/ML FlexPen Inject 18 Units into the skin 3 (three) times daily with meals. 03/07/19   Shamleffer, Melanie Crazier, MD  Insulin Glargine (LANTUS SOLOSTAR) 100 UNIT/ML Solostar Pen Inject 60 Units into the skin at bedtime. 03/04/19   Shamleffer, Melanie Crazier, MD  Insulin Pen Needle (BD PEN NEEDLE MICRO U/F) 32G X 6 MM MISC Four times daily 02/04/19   Shamleffer, Melanie Crazier, MD  Insulin Pen Needle (BD PEN NEEDLE MICRO U/F) 32G X 6 MM MISC 4 times a day 03/04/19   Shamleffer, Melanie Crazier, MD  ketoconazole (NIZORAL) 2 % cream Apply 1 application topically daily. 12/03/18   Shirley Friar, [name]  Lancets [facility] ULTRASOFT) lancets Use as instructed to test blood sugars 4 times daily DX E11.42 03/04/19   Shamleffer, Melanie Crazier, MD  levothyroxine (SYNTHROID,  LEVOTHROID) 175 MCG tablet Take 1 tablet (175 mcg total) by mouth daily. 05/12/18   Angiulli, Lavon Paganini, [name]  metolazone (ZAROXOLYN) 2.5 MG tablet Take 1 tablet (2.5 mg total) by mouth 2 (two) times a week. Monday AND FRIDAY 07/14/19   Bensimhon, Shaune Pascal, MD  multivitamin (ONE-A-DAY MEN'S) TABS tablet Take 1 tablet by mouth at bedtime.    [provider]  polyethylene glycol (MIRALAX / GLYCOLAX) packet Take 17 g by mouth daily as needed. For constipation Patient taking differently: Take 17 g by mouth at bedtime.  08/09/14   Love, Ivan Anchors, [name]  potassium chloride SA (K-DUR) 20 MEQ tablet Take 1 tablet (20 mEq total) by mouth 2 (two) times daily. Take 1 tab only on days you take Metolazone 05/11/19   Mercy Riding, MD  pramipexole (MIRAPEX) 0.5 MG tablet One tablet in the morning and 2 in the evening Patient taking differently: Take 0.5-1 mg by mouth See admin instructions. Take 1 tablet in the morning and take 1 or 2 tablets every evening 11/24/18   Kathrynn Ducking, MD  spironolactone (ALDACTONE) 25 MG tablet Take 1 tablet (25 mg total) by mouth daily. 10/29/18   Nahser, Wonda Cheng, MD  tamsulosin (FLOMAX) 0.4 MG CAPS capsule Take 1 capsule (0.4 mg total) by mouth daily. 05/12/18   Angiulli, Lavon Paganini, [name]  torsemide (DEMADEX) 20 MG tablet Take 4 tablets (80 mg total) by mouth 2 (two) times daily. 05/18/19   Larey Dresser, MD  TOVIAZ 8 MG TB24 tablet Take 1 tablet (8 mg total) by mouth daily. 05/12/18   Angiulli, Lavon Paganini, [name]    Family History Family History  Problem Relation Age of Onset  . Polycythemia Mother   . Diabetes Father   . Hypertension Father   . Heart disease Father   . CAD Other        1 of his 4 brothers and father has CAD    Social History Social History   Tobacco Use  . Smoking status: Former Smoker    Packs/day: 2.00    Years: 20.00    Pack years: 40.00    Types: Cigarettes    Quit date: 06/28/1985    Years since quitting: 34.0  . Smokeless tobacco: Former  Systems developer    Quit date: 06/28/1985  Substance Use Topics  . Alcohol use: No  . Drug use: No     Allergies   Amlodipine, Plendil [felodipine], Compazine [prochlorperazine], Cymbalta [duloxetine hcl],  Jardiance [empagliflozin], Other, Levofloxacin in d5w, and Septra [sulfamethoxazole-trimethoprim]   Review of Systems Review of Systems  Respiratory: Positive for shortness of breath.   Cardiovascular: Positive for leg swelling.  All other systems reviewed and are negative.    Physical Exam Updated Vital Signs BP (!) 159/94 (BP Location: Right Arm)   Pulse 97   Temp 98 F (36.7 C) (Oral)   Resp 20   SpO2 97%   Physical Exam Vitals signs and nursing note reviewed.  Constitutional:      Comments: Chronically ill, tachypneic   HENT:     Head: Normocephalic.     Mouth/Throat:     Mouth: Mucous membranes are moist.  Eyes:     Pupils: Pupils are equal, round, and reactive to light.  Neck:     Musculoskeletal: Normal range of motion and neck supple.  Cardiovascular:     Rate and Rhythm: Normal rate and regular rhythm.  Pulmonary:     Comments: Tachypneic, crackles bilateral bases, + JVD  Abdominal:     Palpations: Abdomen is soft.     Comments: 1+ edema lower abdomen   Musculoskeletal:     Comments: 2 + edema bilaterally   Skin:    General: Skin is warm.     Capillary Refill: Capillary refill takes less than 2 seconds.  Neurological:     General: No focal deficit present.     Mental Status: He is oriented to person, place, and time.  Psychiatric:        Mood and Affect: Mood normal.        Behavior: Behavior normal.      ED Treatments / Results  Labs (all labs ordered are listed, but only abnormal results are displayed) Labs Reviewed  NOVEL CORONAVIRUS, NAA (HOSPITAL ORDER, SEND-OUT TO REF LAB)  CBC WITH DIFFERENTIAL/PLATELET  COMPREHENSIVE METABOLIC PANEL  BRAIN NATRIURETIC PEPTIDE    EKG EKG Interpretation  Date/Time:  Wednesday July 13 2019 18:49:53 EDT  Ventricular Rate:  96 PR Interval:    QRS Duration: 156 QT Interval:  398 QTC Calculation: 503 R Axis:   69 Text Interpretation:  Sinus rhythm Consider left atrial enlargement Right bundle branch block poor baseline, grossly unchanged  Confirmed by Wandra Arthurs [phone]) on 07/13/2019 7:03:48 PM   Radiology No results found.  Procedures Procedures (including critical care time)  CRITICAL CARE Performed by: Wandra Arthurs   Total critical care time: 30 minutes  Critical care time was exclusive of separately billable procedures and treating other patients.  Critical care was necessary to treat or prevent imminent or life-threatening deterioration.  Critical care was time spent personally by me on the following activities: development of treatment plan with patient and/or surrogate as well as nursing, discussions with consultants, evaluation of patient's response to treatment, examination of patient, obtaining history from patient or surrogate, ordering and performing treatments and interventions, ordering and review of laboratory studies, ordering and review of radiographic studies, pulse oximetry and re-evaluation of patient's condition.    Medications Ordered in ED Medications - No data to display   Initial Impression / Assessment and Plan / ED Course  I have reviewed the triage vital signs and the nursing notes.  Pertinent labs & imaging results that were available during my care of the patient were reviewed by me and considered in my medical decision making (see chart for details).       Travis [name] is a 73 y.o. male here with SOB, hypoxia, leg  swelling.  Patient had worsening shortness of breath and leg swelling and has a history of CHF.  Patient is hypoxic at home to 70%.  I think he has acute on chronic respiratory distress with hypoxia secondary to CHF exacerbation. Will get labs, BNP, CXR. Will likely need IV diuresis.  7:54 PM CXR showed atelectasis.  Creatinine is  baseline at 2. Patient is tachypneic. Appears volume overloaded. Will admit for respiratory distress with hypoxia from CHF. Given lasix 80 mg IV. Will admit.    Final Clinical Impressions(s) / ED Diagnoses   Final diagnoses:  None    ED Discharge Orders    None       Drenda Freeze, MD 07/13/19 [facility] [place]

## 2019-07-14 ENCOUNTER — Other Ambulatory Visit: Payer: Self-pay

## 2019-07-14 DIAGNOSIS — I5032 Chronic diastolic (congestive) heart failure: Secondary | ICD-10-CM

## 2019-07-14 DIAGNOSIS — G4733 Obstructive sleep apnea (adult) (pediatric): Secondary | ICD-10-CM

## 2019-07-14 DIAGNOSIS — Z9989 Dependence on other enabling machines and devices: Secondary | ICD-10-CM

## 2019-07-14 DIAGNOSIS — G2581 Restless legs syndrome: Secondary | ICD-10-CM

## 2019-07-14 DIAGNOSIS — N183 Chronic kidney disease, stage 3 (moderate): Secondary | ICD-10-CM

## 2019-07-14 DIAGNOSIS — J9611 Chronic respiratory failure with hypoxia: Secondary | ICD-10-CM

## 2019-07-14 LAB — COMPREHENSIVE METABOLIC PANEL
ALT: 14 U/L (ref 0–44)
AST: 16 U/L (ref 15–41)
Albumin: 3.3 g/dL — ABNORMAL LOW (ref 3.5–5.0)
Alkaline Phosphatase: 85 U/L (ref 38–126)
Anion gap: 13 (ref 5–15)
BUN: 63 mg/dL — ABNORMAL HIGH (ref 8–23)
CO2: 33 mmol/L — ABNORMAL HIGH (ref 22–32)
Calcium: 9.3 mg/dL (ref 8.9–10.3)
Chloride: 93 mmol/L — ABNORMAL LOW (ref 98–111)
Creatinine, Ser: 1.83 mg/dL — ABNORMAL HIGH (ref 0.61–1.24)
GFR calc Af Amer: 41 mL/min — ABNORMAL LOW (ref >60)
GFR calc non Af Amer: 36 mL/min — ABNORMAL LOW (ref >60)
Glucose, Bld: 181 mg/dL — ABNORMAL HIGH (ref 70–99)
Potassium: 4 mmol/L (ref 3.5–5.1)
Sodium: 139 mmol/L (ref 135–145)
Total Bilirubin: 0.6 mg/dL (ref 0.3–1.2)
Total Protein: 7.2 g/dL (ref 6.5–8.1)

## 2019-07-14 LAB — CBC WITH DIFFERENTIAL/PLATELET
Abs Immature Granulocytes: 0.04 10*3/uL (ref 0.00–0.07)
Basophils Absolute: 0 10*3/uL (ref 0.0–0.1)
Basophils Relative: 0 %
Eosinophils Absolute: 0.2 10*3/uL (ref 0.0–0.5)
Eosinophils Relative: 3 %
HCT: 39.6 % (ref 39.0–52.0)
Hemoglobin: 12.1 g/dL — ABNORMAL LOW (ref 13.0–17.0)
Immature Granulocytes: 1 %
Lymphocytes Relative: 16 %
Lymphs Abs: 1 10*3/uL (ref 0.7–4.0)
MCH: 28.8 pg (ref 26.0–34.0)
MCHC: 30.6 g/dL (ref 30.0–36.0)
MCV: 94.3 fL (ref 80.0–100.0)
Monocytes Absolute: 0.6 10*3/uL (ref 0.1–1.0)
Monocytes Relative: 9 %
Neutro Abs: 4.5 10*3/uL (ref 1.7–7.7)
Neutrophils Relative %: 71 %
Platelets: 220 10*3/uL (ref 150–400)
RBC: 4.2 MIL/uL — ABNORMAL LOW (ref 4.22–5.81)
RDW: 15.9 % — ABNORMAL HIGH (ref 11.5–15.5)
WBC: 6.2 10*3/uL (ref 4.0–10.5)
nRBC: 0 % (ref 0.0–0.2)

## 2019-07-14 LAB — GLUCOSE, CAPILLARY
Glucose-Capillary: 237 mg/dL — ABNORMAL HIGH (ref 70–99)
Glucose-Capillary: 81 mg/dL (ref 70–99)
Glucose-Capillary: 245 mg/dL — ABNORMAL HIGH (ref 70–99)

## 2019-07-14 LAB — TSH: TSH: 1.243 u[IU]/mL (ref 0.350–4.500)

## 2019-07-14 LAB — MAGNESIUM: Magnesium: 2.5 mg/dL — ABNORMAL HIGH (ref 1.7–2.4)

## 2019-07-14 MED ORDER — FUROSEMIDE 10 MG/ML IJ SOLN
10.0000 mg/h | INTRAVENOUS | Status: DC
  Administered 2019-07-14 – 2019-07-16 (×3): 15 mg/h via INTRAVENOUS
  Administered 2019-07-17: 03:00:00 10 mg/h via INTRAVENOUS
  Filled 2019-07-14: qty 20
  Filled 2019-07-14 (×3): qty 25
  Filled 2019-07-14: qty 2

## 2019-07-14 MED ORDER — PRAMIPEXOLE DIHYDROCHLORIDE 1 MG PO TABS
0.5000 mg | ORAL_TABLET | Freq: Every day | ORAL | Status: DC | PRN
  Administered 2019-07-14: 0.5 mg via ORAL

## 2019-07-14 MED ORDER — INSULIN GLARGINE 100 UNIT/ML ~~LOC~~ SOLN
10.0000 [IU] | Freq: Every day | SUBCUTANEOUS | Status: DC
  Administered 2019-07-14: 10 [IU] via SUBCUTANEOUS
  Filled 2019-07-14 (×2): qty 0.1

## 2019-07-14 NOTE — Progress Notes (Addendum)
PROGRESS NOTE    Travis [name]  YBO:[id number] DOB: 04-18-1946 DOA: 07/13/2019 PCP: [name], MD    Brief Narrative:  73 year old male who presented with dyspnea extremity edema.  He does have significant past medical history for diastolic heart failure, type 2 diabetes mellitus, COPD, chronic kidney disease and sleep apnea.  Patient reported 2-week history of worsening dyspnea lower extremity edema despite increase dose of metolazone at home.  On his initial physical examination he was hypoxic, requiring 6 L per nasal cannula, blood pressure 115/61, heart rate 84, respiratory 35, oxygen saturation 98%, his lungs had no rhonchi or rails, heart is also present with me, abdomen soft, lower extremities with compression stockings bilaterally.  Sodium 137 potassium 4.4, chloride 91 bicarb 30, glucose 287, BUN 63, creatinine 2.0, white count 7.9, hemoglobin 13.9, hematocrit 42.2, platelets 242. SARS covid 19 negative.  Chest film hypoinflated, left base atelectasis.  EKG 96 bpm, normal axis, right bundle branch block, no ST segment changes, no T wave changes.  Patient was admitted to the hospital with the working diagnosis of decompensated systolic heart failure.   Assessment & Plan:   Principal Problem:   Acute on chronic diastolic CHF (congestive heart failure) (HCC) Active Problems:   OSA on CPAP   Restless legs syndrome   Hypothyroid   Stage 3 chronic kidney disease (HCC)   Type 2 diabetes mellitus with stage 3 chronic kidney disease, with long-term current use of insulin (HCC)   COPD (chronic obstructive pulmonary disease) (HCC)   Acute CHF (congestive heart failure) ([place])   1. Acute on chronic decompensated diastolic heart failure. Patient continue to be hypervolemic, persistent edema of the lower extremities, positive wet rales. Urine output over last 24 H has been 1,920 ml. Continue diuresis with furosemide drip. Continue metolazone and spironolactone.   2. CKD stage 3.  Stable renal function with serum cr at 1,83 with K at 4,0 and serum bicarbonate at 33. Will continue diuresis with furosemide, follow on renal panel in am. Close follow up on K, patient on K supplementation and spironolactone.   3. COPD. Stable with no signs of exacerbation.   4. T2DM. Continue glucose cover and monitoring with insulins sliding scale., patient is tolerating po well. AM glucose 181, will decrease dose of basal insulin from 60 to 10 and will hold on pre-meal insulin for now to prevent hypoglycemia.   5. Obesity. Calculated BMI is 39,2  6. Hypothyroid. Continue levothyroxine.   DVT prophylaxis: heparin   Code Status: full Family Communication: no family at the bedside  Disposition Plan/ discharge barriers: pending clinical improvement.  Body mass index is 39.27 kg/m. Malnutrition Type:      Malnutrition Characteristics:      Nutrition Interventions:     RN Pressure Injury Documentation:    Consultants:   Cardiology   Procedures:     Antimicrobials:       Subjective: Patient continue to have edema and dyspnea, have improved but not yet back to baseline, no nausea or vomiting, no chest pain. At home uses supplemental 02 at night and uses a walker for ambulation.   Objective: Vitals:   07/13/19 2156 07/14/19 0434 07/14/19 0800 07/14/19 1337  BP:  (!) 168/81  (!) 179/91  Pulse:  68 67 73  Resp:  18 (!) 30 (!) 25  Temp: 98.1 F (36.7 C) 97.8 F (36.6 C)  99 F (37.2 C)  TempSrc: Oral Axillary  Oral  SpO2:  96% 96% 94%  Weight:  135 kg      Intake/Output Summary (Last 24 hours) at 07/14/2019 1443 Last data filed at 07/14/2019 1116 Gross per 24 hour  Intake 840 ml  Output 2395 ml  Net -1555 ml   Filed Weights   07/14/19 0434  Weight: 135 kg    Examination:   General: deconditioned  Neurology: Awake and alert, non focal  E ENT: no pallor, no icterus, oral mucosa moist Cardiovascular: No JVD. S1-S2 present, rhythmic, no gallops,  rubs, or murmurs. +++ pitting lower extremity edema. Pulmonary: decreased breath sounds bilaterally,no wheezing, rhonchi, bibasilar rales. Gastrointestinal. Abdomen with no organomegaly, non tender, no rebound or guarding Skin. No rashes Musculoskeletal: no joint deformities     Data Reviewed: I have personally reviewed following labs and imaging studies  CBC: Recent Labs  Lab 07/13/19 1845 07/14/19 0543  WBC 7.9 6.2  NEUTROABS 6.3 4.5  HGB 13.1 12.1*  HCT 42.2 39.6  MCV 94.2 94.3  PLT 242 170   Basic Metabolic Panel: Recent Labs  Lab 07/13/19 1845 07/14/19 0543  NA 137 139  K 4.4 4.0  CL 92* 93*  CO2 30 33*  GLUCOSE 287* 181*  BUN 63* 63*  CREATININE 2.00* 1.83*  CALCIUM 9.2 9.3  MG  --  2.5*   GFR: Estimated Creatinine Clearance: 51 mL/min (A) (by C-G formula based on SCr of 1.83 mg/dL (H)). Liver Function Tests: Recent Labs  Lab 07/13/19 1845 07/14/19 0543  AST 19 16  ALT 16 14  ALKPHOS 104 85  BILITOT 0.5 0.6  PROT 8.2* 7.2  ALBUMIN 3.7 3.3*   No results for input(s): LIPASE, AMYLASE in the last 168 hours. No results for input(s): AMMONIA in the last 168 hours. Coagulation Profile: No results for input(s): INR, PROTIME in the last 168 hours. Cardiac Enzymes: No results for input(s): CKTOTAL, CKMB, CKMBINDEX, TROPONINI in the last 168 hours. BNP (last 3 results) No results for input(s): PROBNP in the last 8760 hours. HbA1C: No results for input(s): HGBA1C in the last 72 hours. CBG: Recent Labs  Lab 07/14/19 1154  GLUCAP 237*   Lipid Profile: No results for input(s): CHOL, HDL, LDLCALC, TRIG, CHOLHDL, LDLDIRECT in the last 72 hours. Thyroid Function Tests: Recent Labs    07/14/19 0543  TSH 1.243   Anemia Panel: No results for input(s): VITAMINB12, FOLATE, FERRITIN, TIBC, IRON, RETICCTPCT in the last 72 hours.    Radiology Studies: I have reviewed all of the imaging during this hospital visit personally     Scheduled Meds: .  aspirin EC  81 mg Oral Daily  . atorvastatin  20 mg Oral QPM  . enoxaparin (LOVENOX) injection  40 mg Subcutaneous Q24H  . escitalopram  20 mg Oral Daily  . fesoterodine  8 mg Oral Daily  . gabapentin  400 mg Oral TID  . hydrALAZINE  25 mg Oral TID  . insulin aspart  0-9 Units Subcutaneous TID WC  . insulin aspart  18 Units Subcutaneous TID WC  . insulin glargine  60 Units Subcutaneous QHS  . levothyroxine  175 mcg Oral Q0600  . [START ON 07/15/2019] metolazone  2.5 mg Oral Q Fri  . multivitamin with minerals  1 tablet Oral QHS  . polyethylene glycol  17 g Oral QHS  . potassium chloride SA  20 mEq Oral BID  . pramipexole  0.5 mg Oral BID  . spironolactone  25 mg Oral Daily  . tamsulosin  0.4 mg Oral QHS   Continuous Infusions: . furosemide (LASIX)  infusion       LOS: 1 day        Zebadiah Willert Gerome Apley, MD

## 2019-07-14 NOTE — Plan of Care (Signed)
  Problem: Clinical Measurements: Goal: Will remain free from infection Outcome: Progressing Goal: Diagnostic test results will improve Outcome: Progressing Goal: Respiratory complications will improve Outcome: Progressing   Problem: Nutrition: Goal: Adequate nutrition will be maintained Outcome: Progressing   Problem: Pain Managment: Goal: General experience of comfort will improve Outcome: Progressing   Problem: Safety: Goal: Ability to remain free from injury will improve Outcome: Progressing   Problem: Skin Integrity: Goal: Risk for impaired skin integrity will decrease Outcome: Progressing

## 2019-07-14 NOTE — Plan of Care (Signed)
  Problem: Activity: Goal: Capacity to carry out activities will improve Outcome: Progressing   Problem: Cardiac: Goal: Ability to achieve and maintain adequate cardiopulmonary perfusion will improve Outcome: Progressing   

## 2019-07-14 NOTE — Consult Note (Addendum)
Cardiology Consultation:   Patient ID: Travis [name]; [id number]; 06-30-1946   Admit date: 07/13/2019 Date of Consult: 07/14/2019  Primary Care Provider: [name], MD Primary Cardiologist: Dr. Alfonzo Beers, MD/Dr. Glori Bickers, MD   Patient Profile:   Travis [name] is a 73 y.o. male with a hx of diastolic heart failure (LVEF 60-65% per echo 04/2019), HTN, hypothyroidism, OSA, DMII, COPD on chronic oxygen, nonobstructive CAD per cath 2016 and spinal stenosis who is being seen today for the evaluation of CHF at the request of Dr. Hal [name].  History of Present Illness:   [name] is a 73yo M with a hx as stated above who presented to the ED with c/o progressively worsening SOB and LE swelling. Per chart review, wife called the AHF clinic on 07/12/2019 stating that the patient was having issues with LE swelling and increased SOB. His home regimen at that time was Torsemide 79m BID, Metolazone 2.51mevery Friday and Spironolactone 2568maily. Per Dr. BenHaroldine Lawsstructions, metolazone was increased to every Monday and Friday. He had little to no improvement with this regimen and therefore proceeded to the ED for further evaluation.   In the ED, he was found to be hypoxic, requiring 6L supplemental oxygen. CXR on admission with low lung volumes, plate-like atelectasis in the bases and aortic atherosclerosis. EKG showed NSR with HR 96bpm with RBBB however there was significant artifact on tracing. Does not appear to have any significant acute ST-T wave changes. BNP was 45. Creatinine was slightly above his baseline at 2.0>>now lower at 1.83. He was started on IV Lasix 15m52mH and was left on metolazone and spironolactone.   On my exam [name] that his breathing is improved.  He is up in the chair.  He does continue to have shortness of breath with minimal exertion even leaning up during his respiratory exam.  He denies chest pain, palpitations, dizziness or syncope.   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  He states he uses a riding wheelchair and walker for ambulation.  He reports it is difficult for him to weigh himself daily given the weakness in his legs.  We discussed how this will be important to prevent future hospitalizations.  He was last admitted 04/30/2019 to 05/11/2019 for fluid volume overload in which he was diuresed with IV Lasix and metolazone. He had issues with AKI that improved after transitioning to oral diuretics. PT recommended that he be discharged to a SNF, however the patient refused. His discharge weight at that time was 281lb. He was discharged on Torsemide 15mg19m with the addition of Metolazone 2.5mg w66mly. He did have HHRN and PT.   He was last seen by Travis [name] 05/31/2019 and stated that his LE swelling had improved but he remained SOB with exertion although this is at his baseline. His volume status appeared stable and no medication changes were made at that time. His weight was noted to be 281 from 05/11/2019.   On 06/10/2019 he was seen by Dr. NasherCathie [name]-health follow up of CHF and seemed to be doing well with no changes to regimen.   Past Medical History:  Diagnosis Date  . Cervical myelopathy (HCC)    with myelomalacia at C5-6 and C3-4  . CHF (congestive heart failure) (HCC)  MillerConstipation - functional    controlled with miralax  . COPD (chronic obstructive pulmonary disease) (  HCC)    mild  . DJD (degenerative joint disease)    WHOLE SPINE  . Dyspnea   . Gait disorder   . Heart murmur    years ago   . Hypertension   . Hypothyroidism    Low d/t treatment of hyperthyroidism  . Lumbosacral spinal stenosis 03/13/2014  . On home oxygen therapy    "2.5L all the time" (06/29/2018)  . Peripheral neuropathy   . Pneumonia   . PONV (postoperative nausea and vomiting)   . RBBB   . Renal disorder    acute kidney failure  . Restless  legs syndrome 03/14/2015  . RLS (restless legs syndrome)   . Sleep apnea    DX  2009/2010 study done at [facility]  . 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 Type II diabetes mellitus ([place])    diet controlled    Past Surgical History:  Procedure Laterality Date  . BACK SURGERY     6 surgeries  . CARDIAC CATHETERIZATION [name] 09/07/2015   Procedure: Right/Left Heart Cath and Coronary Angiography;  Surgeon: Peter M Martinique, MD;  Location: [facility];  Service: Cardiovascular;  Laterality: [name];  . CHOLECYSTECTOMY    . EYE SURGERY     BIL CATARACTS  . LUMBAR LAMINECTOMY/DECOMPRESSION MICRODISCECTOMY  07/14/2012   Procedure: LUMBAR LAMINECTOMY/DECOMPRESSION MICRODISCECTOMY 2 LEVELS;  Surgeon: Eustace Moore, MD;  Location: [facility];  Service: Neurosurgery;  Laterality: Left;  Lumbar two three laminectomy, left thoracic four five transpedicular diskectomy  . POSTERIOR CERVICAL FUSION/FORAMINOTOMY  04/08/2018   Procedure: Laminectomy and Foraminotomy - Lumbar Four-Lumbar Five, instrumented posterolateral fusion Lumbar Four- Five, Laminectomy and Foraminotomy - Thoracic One-Thoracic Two;  Surgeon: Eustace Moore, MD;  Location: [facility];  Service: Neurosurgery;;  posterior  . POSTERIOR LUMBAR FUSION  04/08/2018   Laminectomy and Foraminotomy - Lumbar Four-Lumbar Five, instrumented posterolateral fusion Lumbar Four- Five, Laminectomy and Foraminotomy - Thoracic One-Thoracic Two  . POSTERIOR LUMBAR FUSION  04/08/2018   Laminectomy and Foraminotomy - Lumbar Four-Lumbar Five, instrumented posterolateral fusion Lumbar Four- Five, Laminectomy and Foraminotomy - Thoracic One-Thoracic Two     Prior to Admission medications   Medication Sig Start Date End Date Taking? Authorizing Provider  aspirin EC 81 MG tablet Take 81 mg by mouth daily.   Yes [provider]  atorvastatin (LIPITOR) 20 MG tablet Take 20 mg by mouth every evening.    Yes [provider]  Blood Glucose Monitoring Suppl  (ONETOUCH VERIO) w/Device KIT 1 kit by Does not apply route 4 (four) times daily. Use as directed to test blood sugars 4 times daily DX E11.42 03/04/19  Yes Shamleffer, Melanie Crazier, MD  Continuous Blood Gluc Receiver (FREESTYLE LIBRE 14 DAY READER) DEVI 1 kit by Does not apply route 3 (three) times daily. 02/07/19  Yes Shamleffer, Melanie Crazier, MD  Continuous Blood Gluc Sensor (FREESTYLE LIBRE 14 DAY SENSOR) MISC 1 kit by Does not apply route 3 (three) times daily. 02/07/19  Yes Shamleffer, Melanie Crazier, MD  escitalopram (LEXAPRO) 20 MG tablet Take 20 mg by mouth daily.   Yes [provider]  gabapentin (NEURONTIN) 400 MG capsule Take 400 mg by mouth 3 (three) times daily.   Yes [provider]  glucose blood test strip Use as instructed to test blood sugars 4 times daily DX E11.42 03/04/19  Yes Shamleffer, Melanie Crazier, MD  hydrALAZINE (APRESOLINE) 25 MG tablet Take 25 mg by mouth 3 (three) times daily.  Yes [provider]  insulin aspart (NOVOLOG FLEXPEN) 100 UNIT/ML FlexPen Inject 18 Units into the skin 3 (three) times daily with meals. 03/07/19  Yes Shamleffer, Melanie Crazier, MD  Insulin Glargine (LANTUS SOLOSTAR) 100 UNIT/ML Solostar Pen Inject 60 Units into the skin at bedtime. 03/04/19  Yes Shamleffer, Melanie Crazier, MD  Insulin Pen Needle (BD PEN NEEDLE MICRO U/F) 32G X 6 MM MISC 4 times a day 03/04/19  Yes Shamleffer, Melanie Crazier, MD  Lancets [facility] ULTRASOFT) lancets Use as instructed to test blood sugars 4 times daily DX E11.42 03/04/19  Yes Shamleffer, Melanie Crazier, MD  levothyroxine (SYNTHROID, LEVOTHROID) 175 MCG tablet Take 1 tablet (175 mcg total) by mouth daily. 05/12/18  Yes Angiulli, Lavon Paganini, [name]  metolazone (ZAROXOLYN) 2.5 MG tablet Take 1 tablet (2.5 mg total) by mouth 2 (two) times a week. Monday AND FRIDAY Patient taking differently: Take 2.5 mg by mouth once a week. FRIDAY 07/14/19  Yes Bensimhon, Shaune Pascal, MD  multivitamin (ONE-A-DAY  MEN'S) TABS tablet Take 1 tablet by mouth at bedtime.   Yes [provider]  OXYGEN Inhale 2-3 Units into the lungs at bedtime.   Yes [provider]  polyethylene glycol (MIRALAX / GLYCOLAX) packet Take 17 g by mouth daily as needed. For constipation Patient taking differently: Take 17 g by mouth at bedtime.  08/09/14  Yes Love, Ivan Anchors, [name]  potassium chloride SA (K-DUR) 20 MEQ tablet Take 1 tablet (20 mEq total) by mouth 2 (two) times daily. Take 1 tab only on days you take Metolazone 05/11/19  Yes Gonfa, Charlesetta Ivory, MD  pramipexole (MIRAPEX) 0.5 MG tablet One tablet in the morning and 2 in the evening Patient taking differently: Take 0.5 mg by mouth 2 (two) times daily.  11/24/18  Yes Kathrynn Ducking, MD  spironolactone (ALDACTONE) 25 MG tablet Take 1 tablet (25 mg total) by mouth daily. 10/29/18  Yes Nahser, Wonda Cheng, MD  tamsulosin (FLOMAX) 0.4 MG CAPS capsule Take 1 capsule (0.4 mg total) by mouth daily. Patient taking differently: Take 0.4 mg by mouth at bedtime.  05/12/18  Yes Angiulli, Lavon Paganini, [name]  torsemide (DEMADEX) 20 MG tablet Take 4 tablets (80 mg total) by mouth 2 (two) times daily. 05/18/19  Yes Larey Dresser, MD  TOVIAZ 8 MG TB24 tablet Take 1 tablet (8 mg total) by mouth daily. 05/12/18  Yes Angiulli, Lavon Paganini, [name]  Insulin Pen Needle (BD PEN NEEDLE MICRO U/F) 32G X 6 MM MISC Four times daily 02/04/19   Shamleffer, Melanie Crazier, MD  ketoconazole (NIZORAL) 2 % cream Apply 1 application topically daily. Patient not taking: Reported on 07/13/2019 12/03/18   Shirley Friar, [name]    Inpatient Medications: Scheduled Meds: . aspirin EC  81 mg Oral Daily  . atorvastatin  20 mg Oral QPM  . enoxaparin (LOVENOX) injection  40 mg Subcutaneous Q24H  . escitalopram  20 mg Oral Daily  . fesoterodine  8 mg Oral Daily  . furosemide  80 mg Intravenous Q6H  . gabapentin  400 mg Oral TID  . hydrALAZINE  25 mg Oral TID  . insulin aspart  0-9 Units Subcutaneous TID  WC  . insulin aspart  18 Units Subcutaneous TID WC  . insulin glargine  60 Units Subcutaneous QHS  . levothyroxine  175 mcg Oral Q0600  . [START ON 07/15/2019] metolazone  2.5 mg Oral Q Fri  . multivitamin with minerals  1 tablet Oral QHS  . polyethylene glycol  17 g Oral QHS  . potassium chloride SA  20 mEq Oral BID  . pramipexole  0.5 mg Oral BID  . spironolactone  25 mg Oral Daily  . tamsulosin  0.4 mg Oral QHS   Continuous Infusions:  PRN Meds: acetaminophen **OR** acetaminophen, ondansetron **OR** ondansetron (ZOFRAN) IV  Allergies:    Allergies  Allergen Reactions  . Amlodipine Swelling and Other (See Comments)    Causes edema (per [facility] paperwork)  . Plendil [Felodipine] Swelling and Other (See Comments)    Causes edema (per [facility] paperwork)  . Compazine [Prochlorperazine] Other (See Comments)    Caused agitation (per [facility] paperwork)  . Cymbalta [Duloxetine Hcl] Other (See Comments)    Made the patient "feel weird," per [facility] paperwork  . Jardiance [Empagliflozin] Other (See Comments)    Stopped by MD because of unwanted side effects  . Other Nausea Only and Other (See Comments)    ANESTHESIA UNSPECIFIED- makes sick, must have anti-nausea medication in advance  . Levofloxacin In D5w Itching    Possible infusion reaction 03/29/17. Tolerating oral Levaquin 03/30/17  . Septra [Sulfamethoxazole-Trimethoprim] Nausea Only and Other (See Comments)    Per [facility] paperwork    Social History:   Social History   Socioeconomic History  . Marital status: Married    Spouse name: carolyn  . Number of children: 3  . Years of education: 40  . Highest education level: Not on file  Occupational History    Employer: [employer]: Works in [place]  . Financial resource strain: Not on file  . Food insecurity    Worry: Not on file    Inability: Not on file  . Transportation needs     Medical: Not on file    Non-medical: Not on file  Tobacco Use  . Smoking status: Former Smoker    Packs/day: 2.00    Years: 20.00    Pack years: 40.00    Types: Cigarettes    Quit date: 06/28/1985    Years since quitting: 34.0  . Smokeless tobacco: Former Systems developer    Quit date: 06/28/1985  Substance and Sexual Activity  . Alcohol use: No  . Drug use: No  . Sexual activity: Not on file  Lifestyle  . Physical activity    Days per week: Not on file    Minutes per session: Not on file  . Stress: Not on file  Relationships  . Social Herbalist on phone: Not on file    Gets together: Not on file    Attends religious service: Not on file    Active member of club or organization: Not on file    Attends meetings of clubs or organizations: Not on file    Relationship status: Not on file  . Intimate partner violence    Fear of current or ex partner: Not on file    Emotionally abused: Not on file    Physically abused: Not on file    Forced sexual activity: Not on file  Other Topics Concern  . Not on file  Social History Narrative   Lives with wife,  [name]   Patient is married with 3 children.   Patient has 16 yrs of education.   Patient is right handed.   Patient drinks 2 cups of caffeine per day.    Family History:   Family History  Problem Relation Age of Onset  . Polycythemia  Mother   . Diabetes Father   . Hypertension Father   . Heart disease Father   . CAD Other        1 of his 4 brothers and father has CAD   Family Status:  Family Status  Relation Name Status  . Mother  Deceased at age 75       Blood disorder  . Father  Deceased at age 10  . Brother  Alive       Good health  . Brother  Alive       Good health  . Brother  Alive       Good health  . Brother  Alive       Good health  . MGM  Deceased  . MGF  Deceased  . PGM  Deceased  . PGF  Deceased  . Other  (Not Specified)    ROS:  Please see the history of present illness.  All other ROS  reviewed and negative.     Physical Exam/Data:   Vitals:   07/13/19 2100 07/13/19 2155 07/13/19 2156 07/14/19 0434  BP: (!) 128/58 (!) 150/75  (!) 168/81  Pulse: 83 87  68  Resp: (!) 24 (!) 25  18  Temp:   98.1 F (36.7 C) 97.8 F (36.6 C)  TempSrc:   Oral Axillary  SpO2: 94% (!) 88%  96%  Weight:    135 kg    Intake/Output Summary (Last 24 hours) at 07/14/2019 0956 Last data filed at 07/14/2019 0347 Gross per 24 hour  Intake 120 ml  Output 1920 ml  Net -1800 ml   Filed Weights   07/14/19 0434  Weight: 135 kg   Body mass index is 39.27 kg/m.   General: Overweight, mild dyspnea with exertion Skin: Warm, dry, intact  Neck: Negative for carotid bruits. No JVD Lungs: Diminished in bilateral upper and lower lobes. Breathing is mildly labored with exertion. Cardiovascular: RRR with S1 S2. No murmurs, rubs, gallops, or LV heave appreciated. Abdomen: Soft, non-tender, distended. No obvious abdominal masses. MSK: Strength and tone appear normal for age. 5/5 in all extremities Extremities: 3+ pitting BLE edema to the thighs. No clubbing or cyanosis. DP/PT pulses 1+ bilaterally Neuro: Alert and oriented. No focal deficits. No facial asymmetry. MAE spontaneously. Psych: Responds to questions appropriately with normal affect.     EKG:  The EKG was personally reviewed and demonstrates: 07/13/2019 NSR with HR 96bpm with RBBB however there was significant artifact on tracing. Does not appear to have any significant acute ST-T wave changes.   Telemetry:  Telemetry was personally reviewed and demonstrates: 07/14/2019 NSR with occasional PVCs and no acute changes.  Relevant CV Studies:  Echocardiogram 05/01/2019: 1. The left ventricle has normal systolic function, with an ejection fraction of 60-65%. Left ventricular diastolic Doppler parameters are consistent with indeterminate diastolic dysfunction. No evidence of left ventricular regional wall motion  abnormalities.  2. The right  ventricle has normal systolc function. The cavity was normal. There is no increase in right ventricular wall thickness. Right ventricular systolic pressure could not be assessed.  3. Left atrial size was not well visualized.  4. The mitral valve is grossly normal.  5. The aortic valve is tricuspid.  6. The aortic root is normal in size and structure.  7. The inferior vena cava was dilated in size with <50% respiratory variability.  8. The interatrial septum was not well visualized.  FINDINGS  Left Ventricle: The left ventricle has normal systolic  function, with an ejection fraction of 60-65%. Left ventricular diastolic Doppler parameters are consistent with indeterminate diastolic dysfunction. No evidence of left ventricular regional wall  motion abnormalities. Definity contrast agent was given IV to delineate the left ventricular endocardial borders.  Echocardiogram 06/29/2018: Study Conclusions  - Left ventricle: The cavity size was normal. Wall thickness was   normal. Systolic function was normal. The estimated ejection   fraction was in the range of 55% to 60%. Wall motion was normal;   there were no regional wall motion abnormalities. Doppler   parameters are consistent with abnormal left ventricular   relaxation (grade 1 diastolic dysfunction).  Impressions:  - Technically difficult; definity used; normal LV systolic   function; mild diastolic dysfunction.  R/LHC 09/07/2015:  Prox RCA to Mid RCA lesion, 40% stenosed.  Ost Cx to Prox Cx lesion, 50% stenosed.  LM lesion, 40% stenosed.  The left ventricular systolic function is normal.  Dist LAD lesion, 40% stenosed.   1. Nonobstructive CAD 2. Normal LV function. 3. Mild pulmonary HTN with normal LV filling pressures.   Plan: medical therapy.  Laboratory Data:  Chemistry Recent Labs  Lab 07/13/19 1845 07/14/19 0543  NA 137 139  K 4.4 4.0  CL 92* 93*  CO2 30 33*  GLUCOSE 287* 181*  BUN 63* 63*   CREATININE 2.00* 1.83*  CALCIUM 9.2 9.3  GFRNONAA 32* 36*  GFRAA 37* 41*  ANIONGAP 15 13    Total Protein  Date Value Ref Range Status  07/14/2019 7.2 6.5 - 8.1 g/dL Final  09/23/2017 7.2 6.0 - 8.5 g/dL Final   Albumin  Date Value Ref Range Status  07/14/2019 3.3 (L) 3.5 - 5.0 g/dL Final   AST  Date Value Ref Range Status  07/14/2019 16 15 - 41 U/L Final   ALT  Date Value Ref Range Status  07/14/2019 14 0 - 44 U/L Final   Alkaline Phosphatase  Date Value Ref Range Status  07/14/2019 85 38 - 126 U/L Final   Total Bilirubin  Date Value Ref Range Status  07/14/2019 0.6 0.3 - 1.2 mg/dL Final   Hematology Recent Labs  Lab 07/13/19 1845 07/14/19 0543  WBC 7.9 6.2  RBC 4.48 4.20*  HGB 13.1 12.1*  HCT 42.2 39.6  MCV 94.2 94.3  MCH 29.2 28.8  MCHC 31.0 30.6  RDW 15.9* 15.9*  PLT 242 220   Cardiac EnzymesNo results for input(s): TROPONINI in the last 168 hours. No results for input(s): TROPIPOC in the last 168 hours.  BNP Recent Labs  Lab 07/13/19 1845  BNP 45.7    DDimer No results for input(s): DDIMER in the last 168 hours. TSH:  Lab Results  Component Value Date   TSH 1.243 07/14/2019   Lipids: Lab Results  Component Value Date   CHOL 124 08/20/2015   HDL 29 (L) 08/20/2015   LDLCALC 79 08/20/2015   TRIG 82 08/20/2015   CHOLHDL 4.3 08/20/2015   HgbA1c: Lab Results  Component Value Date   HGBA1C 10.1 (H) 05/04/2019    Radiology/Studies:  Dg Chest Port 1 View  Result Date: 07/13/2019 CLINICAL DATA:  73 y/o M; 2 days of shortness of breath. Patient denies chest pain. Former smoker. EXAM: PORTABLE CHEST 1 VIEW COMPARISON:  05/01/2019 chest radiograph. FINDINGS: Stable cardiac silhouette partially obscured by elevated hemidiaphragms. Aortic atherosclerosis with calcification. Low lung volumes accentuate pulmonary markings. Platelike atelectasis in the lung bases. No visible effusion. No pneumothorax. No acute osseous abnormality is evident. Midthoracic  fusion hardware noted. IMPRESSION: Low lung volumes. Platelike atelectasis in the lung bases. Aortic atherosclerosis. Electronically Signed   By: Kristine Garbe M.D.   On: 07/13/2019 19:34   Assessment and Plan:   1. Chronic diastolic heart failure: -Presented to the ED with c/o progressively worsening SOB and LE swelling. Per chart review, wife called the AHF clinic on 07/12/2019 stating that the patient was having issues with LE swelling and increased SOB. His home regimen at that time was Torsemide 55m BID, Metolazone 2.550mevery Friday and Spironolactone 2542maily. Per Dr. BenHaroldine Lawsstructions, metolazone was increased to every Monday and Friday.  -CXR on admission with low lung volumes, plate-like atelectasis in the bases and aortic atherosclerosis. -EKG showed NSR with HR 96bpm with RBBB however there was significant artifact on tracing. Does not appear to have any significant acute ST-T wave changes.  -BNP was 45  -Creatinine was slightly above his baseline at 2.0>>however imprved to 1.83 today  -Most recent echocardiogram from 06/29/2017 with LVEF of 55 to 60% with grade 1 DD>>> suspect ongoing right-sided heart failure in the setting of OSA, COPD -Had PYP scan 10/2018 with Grade 1, H/CLL 1.45. Reviewed by Dr. BenMauricia Areatermined to be negative.Myeloma panel with no M-spike, normal IFE>>per chart review  -Started on IV Lasix 18m42mH and was continued on home dose metolazone and spironolactone>> will discuss further diuretic titration with consulting MD.  May consider increasing IV Lasix dose given significant fluid volume overload on exam and adding additional metolazone 2.5 mg dosing to assist with volume excretion. -Weight, 297lb today>>281lb from 05/11/2019 per chart review  -I&O, net -1.8 L since hospital admission  2. Chronic hypoxic respiratory failure/COPD: -Pt was moderately hypoxic on presentation requiring 6 L nasal cannula supplemental O2 -Stable now,  saturations in the 93 to 97% -Monitor closely for decompensation   3. CKD Stage III: -Creatinine, 1.83 today down from 2.00 on 07/13/2019 -Overall baseline appears to be in the 1.7 range -Continue IV diuretics for now and monitor renal function closely with daily BMET   4.  Nonobstructive CAD per [facility] 08/2015: -LHC 08/2015 with left main 40%, dLAD at 40%, LCx ostial 50%, and pRCA with 40% with recommendations for continued medical management -No recent chest pain complaints -Continue ASA, statin  -No BB in the setting of COPD  5.  OSA: -On home BiPAP  6.  DM2: -Last hemoglobin A1c, 10.0 -Noted to be intolerant to Jardiance with secondary UTI rash -Per internal medicine   For questions or updates, please contact [facility] consult [email or web address] for contact info under Cardiology/STEMI.   Signed, JiKathyrn Drown[facility] Pager: [phone] 9:56 AM  Patient seen with NP, agree with the above note.   He is admitted with worsening fluid retention and dyspnea despite an aggressive outpatient regimen of torsemide 80 mg bid + metolazone twice a week.   Echo in 5/20 showed EF 55-60%.  RV was not particularly well-visualized but grossly appeared to have normal systolic function.   On exam, he is volume overloaded with JVP 14+ cm.  2+ to knees.  Decreased BS at bases.   He has diuretic resistance, was admitted with marked volume overload. I suspect significant RV failure with marked overload and elevated creatinine, though the RV did not look particularly abnormal on echo from 5/20. Workup for cardiac amyloidosis was negative in the past. He has been getting Lasix 80 mg IV every 6 hrs here with metolazone, some diuresis but not sure everything has  been recorded and only has weight from today.  - Strict I/Os.  - Will simplify regimen by transitioning to Lasix gtt at 15 mg/hr.   - Dose metolazone today.  - Follow creatinine closely.  - Long-term, I am concerned  about worsening renal function and poor response to po diuretics.  Will need close followup.   Loralie Champagne 07/14/2019

## 2019-07-15 ENCOUNTER — Ambulatory Visit: Payer: PPO | Admitting: Internal Medicine

## 2019-07-15 DIAGNOSIS — I5033 Acute on chronic diastolic (congestive) heart failure: Secondary | ICD-10-CM

## 2019-07-15 LAB — GLUCOSE, CAPILLARY
Glucose-Capillary: 159 mg/dL — ABNORMAL HIGH (ref 70–99)
Glucose-Capillary: 167 mg/dL — ABNORMAL HIGH (ref 70–99)
Glucose-Capillary: 304 mg/dL — ABNORMAL HIGH (ref 70–99)
Glucose-Capillary: 218 mg/dL — ABNORMAL HIGH (ref 70–99)
Glucose-Capillary: 253 mg/dL — ABNORMAL HIGH (ref 70–99)
Glucose-Capillary: 214 mg/dL — ABNORMAL HIGH (ref 70–99)

## 2019-07-15 LAB — BASIC METABOLIC PANEL
Anion gap: 13 (ref 5–15)
BUN: 66 mg/dL — ABNORMAL HIGH (ref 8–23)
CO2: 36 mmol/L — ABNORMAL HIGH (ref 22–32)
Calcium: 9.6 mg/dL (ref 8.9–10.3)
Chloride: 86 mmol/L — ABNORMAL LOW (ref 98–111)
Creatinine, Ser: 1.84 mg/dL — ABNORMAL HIGH (ref 0.61–1.24)
GFR calc Af Amer: 41 mL/min — ABNORMAL LOW (ref >60)
GFR calc non Af Amer: 35 mL/min — ABNORMAL LOW (ref >60)
Glucose, Bld: 239 mg/dL — ABNORMAL HIGH (ref 70–99)
Potassium: 4 mmol/L (ref 3.5–5.1)
Sodium: 135 mmol/L (ref 135–145)

## 2019-07-15 MED ORDER — PNEUMOCOCCAL VAC POLYVALENT 25 MCG/0.5ML IJ INJ
0.5000 mL | INJECTION | INTRAMUSCULAR | Status: DC
  Filled 2019-07-15: qty 0.5

## 2019-07-15 MED ORDER — INSULIN GLARGINE 100 UNIT/ML ~~LOC~~ SOLN
20.0000 [IU] | Freq: Every day | SUBCUTANEOUS | Status: DC
  Administered 2019-07-15 – 2019-07-16 (×2): 20 [IU] via SUBCUTANEOUS
  Filled 2019-07-15 (×3): qty 0.2

## 2019-07-15 NOTE — Consult Note (Signed)
   [facility] CM Inpatient Consult   07/15/2019  RAVON MORTELLARO 1946-01-18 [id number]    Patient reviewed for 27% high risk score of unplanned readmission, with 2 hospitalizations in the past 6 months.  Patient was previouslyoutreached by[facility] care management SWbut not currently active. Our community based plan of care has focused on disease management and community resource support.  Review of records reveal that patient is currently listed as [facility]. He will be followed by [facility] in the community with full case management services.  [facility] care management services are not appropriate at this time.  Will sign off.   For questions and additional information, please contact:  Laira Penninger A. Welcome Fults, BSN, RN-BC [facility] Liaison Cell: [phone]

## 2019-07-15 NOTE — Care Management Important Message (Signed)
Important Message  Patient Details  Name: Travis [name] MRN: [id number] Date of Birth: [birth year]   Medicare Important Message Given:  Yes     Shelda Altes 07/15/2019, 12:55 PM

## 2019-07-15 NOTE — Progress Notes (Signed)
Advanced Heart Failure Rounding Note   Subjective:     Diuresing well. However I/os reflect just -500. Weight recorded as down 19 pounds. Breathing better. Feels less bloated. No CP, SOB, orthopnea or PND.  Trying to watch fluid intake. Creatinine stable at 1.8   Objective:   Weight Range:  Vital Signs:   Temp:  [97.7 F (36.5 C)-98.1 F (36.7 C)] 98.1 F (36.7 C) (07/17 1656) Pulse Rate:  [71-80] 71 (07/17 1656) Resp:  [15-22] 16 (07/17 1656) BP: (123-179)/(62-87) 123/83 (07/17 1656) SpO2:  [90 %-97 %] 95 % (07/17 1656) Weight:  [126.4 kg] 126.4 kg (07/17 0608) Last BM Date: 07/15/19  Weight change: Filed Weights   07/14/19 0434 07/15/19 0608  Weight: 135 kg 126.4 kg    Intake/Output:   Intake/Output Summary (Last 24 hours) at 07/15/2019 1801 Last data filed at 07/15/2019 1751 Gross per 24 hour  Intake 1302.37 ml  Output 3175 ml  Net -1872.63 ml     Physical Exam: General:  Sitting in chair . No resp difficulty HEENT: normal Neck: supple. JVP to jaw Carotids 2+ bilat; no bruits. No lymphadenopathy or thryomegaly appreciated. Cor: PMI nondisplaced. Regular rate & rhythm. No rubs, gallops or murmurs. Lungs: clear Abdomen: marked central obesity soft, nontender, nondistended. No hepatosplenomegaly. No bruits or masses. Good bowel sounds. Extremities: no cyanosis, clubbing, rash, 1-2+ edema Neuro: alert & orientedx3, cranial nerves grossly intact. moves all 4 extremities w/o difficulty. Affect pleasant  Telemetry: Sinus 70s Personally reviewed   Labs: Basic Metabolic Panel: Recent Labs  Lab 07/13/19 1845 07/14/19 0543 07/15/19 1045  NA 137 139 135  K 4.4 4.0 4.0  CL 92* 93* 86*  CO2 30 33* 36*  GLUCOSE 287* 181* 239*  BUN 63* 63* 66*  CREATININE 2.00* 1.83* 1.84*  CALCIUM 9.2 9.3 9.6  MG  --  2.5*  --     Liver Function Tests: Recent Labs  Lab 07/13/19 1845 07/14/19 0543  AST 19 16  ALT 16 14  ALKPHOS 104 85  BILITOT 0.5 0.6  PROT 8.2*  7.2  ALBUMIN 3.7 3.3*   No results for input(s): LIPASE, AMYLASE in the last 168 hours. No results for input(s): AMMONIA in the last 168 hours.  CBC: Recent Labs  Lab 07/13/19 1845 07/14/19 0543  WBC 7.9 6.2  NEUTROABS 6.3 4.5  HGB 13.1 12.1*  HCT 42.2 39.6  MCV 94.2 94.3  PLT 242 220    Cardiac Enzymes: No results for input(s): CKTOTAL, CKMB, CKMBINDEX, TROPONINI in the last 168 hours.  BNP: BNP (last 3 results) Recent Labs    03/02/19 1157 05/01/19 0123 07/13/19 1845  BNP 44.4 87.6 45.7    ProBNP (last 3 results) No results for input(s): PROBNP in the last 8760 hours.    Other results:  Imaging: Dg Chest Port 1 View  Result Date: 07/13/2019 CLINICAL DATA:  73 y/o M; 2 days of shortness of breath. Patient denies chest pain. Former smoker. EXAM: PORTABLE CHEST 1 VIEW COMPARISON:  05/01/2019 chest radiograph. FINDINGS: Stable cardiac silhouette partially obscured by elevated hemidiaphragms. Aortic atherosclerosis with calcification. Low lung volumes accentuate pulmonary markings. Platelike atelectasis in the lung bases. No visible effusion. No pneumothorax. No acute osseous abnormality is evident. Midthoracic fusion hardware noted. IMPRESSION: Low lung volumes. Platelike atelectasis in the lung bases. Aortic atherosclerosis. Electronically Signed   By: Kristine Garbe M.D.   On: 07/13/2019 19:34      Medications:     Scheduled Medications: . aspirin  EC  81 mg Oral Daily  . atorvastatin  20 mg Oral QPM  . enoxaparin (LOVENOX) injection  40 mg Subcutaneous Q24H  . escitalopram  20 mg Oral Daily  . fesoterodine  8 mg Oral Daily  . gabapentin  400 mg Oral TID  . hydrALAZINE  25 mg Oral TID  . insulin aspart  0-9 Units Subcutaneous TID WC  . insulin glargine  20 Units Subcutaneous QHS  . levothyroxine  175 mcg Oral Q0600  . metolazone  2.5 mg Oral Q Fri  . multivitamin with minerals  1 tablet Oral QHS  . [START ON 07/16/2019] pneumococcal 23 valent  vaccine  0.5 mL Intramuscular Tomorrow-1000  . polyethylene glycol  17 g Oral QHS  . potassium chloride SA  20 mEq Oral BID  . pramipexole  0.5 mg Oral BID  . spironolactone  25 mg Oral Daily  . tamsulosin  0.4 mg Oral QHS     Infusions: . furosemide (LASIX) infusion 15 mg/hr (07/15/19 0831)     PRN Medications:  acetaminophen **OR** acetaminophen, ondansetron **OR** ondansetron (ZOFRAN) IV, pramipexole   Assessment/Plan:    1. Chronic diastolic heart failure: -Presented to the ED with c/o progressively worsening SOB and LE swelling. Per chart review, wife called the AHF clinic on 07/12/2019 stating that the patient was having issues with LE swelling and increased SOB. His home regimen at that time was Torsemide 80mg  BID, Metolazone 2.5mg  every Friday and Spironolactone 25mg  daily. Per Dr. [name] instructions, metolazone was increased to every Monday and Friday.  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 Reviewed by Dr. Mauricia Area determined to be negative.Myeloma panel with no M-spike, normal IFE>>per chart review  - Now on lasix gtt at 15 and diuresing well. Renal function stable at 1.8. Continue lasix gtt. Can add metoalzone as needed.  - Will order UNNA boots  - Baseline weight 281lb from 05/11/2019 per chart review    2. Chronic hypoxic respiratory failure/COPD: -Pt was moderately hypoxic on presentation requiring 6 L nasal cannula supplemental O2 -Stable now, saturations in the 93 to 97%  -Monitor closely for decompensation   3. CKD Stage III: -Creatinine stable 1.8 today -Overall baseline appears to be in the 1.7 range -Continue IV diuretics for now and monitor renal function closely with daily BMET   4.  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  OSA: -On home BiPAP  6.  DM2: -Last hemoglobin A1c, 10.0 -Noted to be intolerant to Jardiance with secondary UTI rash -Per internal medicine  Length of Stay: 2   Glori Bickers, MD 07/15/2019, 6:01 PM  Advanced Heart Failure Team Pager [phone] (M-F; 7a - 4p)  Please contact [facility] for night-coverage after hours (4p -7a ) and weekends on [email or web address]

## 2019-07-15 NOTE — Progress Notes (Signed)
PROGRESS NOTE    Travis [name]  LEX:[id number] DOB: 1946-03-20 DOA: 07/13/2019 PCP: [name], MD    Brief Narrative:  73 year old male who presented with dyspnea extremity edema.  He does have significant past medical history for diastolic heart failure, type 2 diabetes mellitus, COPD, chronic kidney disease and sleep apnea.  Patient reported 2-week history of worsening dyspnea lower extremity edema despite increase dose of metolazone at home.  On his initial physical examination he was hypoxic, requiring 6 L per nasal cannula, blood pressure 115/61, heart rate 84, respiratory 35, oxygen saturation 98%, his lungs had no rhonchi or rails, heart is also present with me, abdomen soft, lower extremities with compression stockings bilaterally.  Sodium 137 potassium 4.4, chloride 91 bicarb 30, glucose 287, BUN 63, creatinine 2.0, white count 7.9, hemoglobin 13.9, hematocrit 42.2, platelets 242. SARS covid 19 negative.  Chest film hypoinflated, left base atelectasis.  EKG 96 bpm, normal axis, right bundle branch block, no ST segment changes, no T wave changes.  Patient was admitted to the hospital with the working diagnosis of decompensated systolic heart failure.   Assessment & Plan:   Principal Problem:   Acute on chronic diastolic CHF (congestive heart failure) (HCC) Active Problems:   OSA on CPAP   Restless legs syndrome   Hypothyroid   Stage 3 chronic kidney disease (HCC)   Type 2 diabetes mellitus with stage 3 chronic kidney disease, with long-term current use of insulin (HCC)   COPD (chronic obstructive pulmonary disease) (HCC)   Acute CHF (congestive heart failure) ([place])   1. Acute on chronic decompensated diastolic heart failure. Urine output over last 24 H is 1,900 ml, symptoms have improved but not yet back to baseline. Blood pressure 449 systolic. Continue aggressive diuresis with spironolactone, metolazone and furosemide drip. Echocardiogram from 04/2019 with LF ejection  fraction of 60 to 65%.   2. CKD stage 3. Tolerating well aggressive diuresis, with furosemide, metolazone and spironolactone. Renal functio with serum cr at 1,84 with K at 4,0 and serum bicarbonate at 36. Will continue to follow up renal panel in am. Avoid hypotension or nephrotoxic medications.   3. COPD. No clinical signs of exacerbation.   4. T2DM. Fasting glucose this am at 239, capillary glucose up to 304 this am, will increase basal dose of insulin at 20 units, and will continue insulin sliding scale.   5. Obesity. BMI is 39,2, follow as outpatient.   6. Hypothyroid. On levothyroxine.   DVT prophylaxis: heparin   Code Status: full Family Communication: no family at the bedside  Disposition Plan/ discharge barriers: pending clinical improvement.   Body mass index is 36.77 kg/m. Malnutrition Type:      Malnutrition Characteristics:      Nutrition Interventions:     RN Pressure Injury Documentation:    Consultants:   Cardiology   Procedures:     Antimicrobials:       Subjective: Patient is feeling better, dyspnea and lower extremity edema have improved but not yet back to baseline, no nausea or vomiting, no chest pain. Out of bed to chair today.   Objective: Vitals:   07/15/19 0602 07/15/19 0608 07/15/19 0800 07/15/19 1313  BP: (!) 142/63  (!) 153/73 124/62  Pulse: 72  80 72  Resp: (!) 22  15 20   Temp:    98.1 F (36.7 C)  TempSrc:    Oral  SpO2: 94%  90% 94%  Weight:  126.4 kg    Height:  6\' 1"  (1.854  m)      Intake/Output Summary (Last 24 hours) at 07/15/2019 1405 Last data filed at 07/15/2019 1313 Gross per 24 hour  Intake 1211.8 ml  Output 2225 ml  Net -1013.2 ml   Filed Weights   07/14/19 0434 07/15/19 0608  Weight: 135 kg 126.4 kg    Examination:   General: deconditioned  Neurology: Awake and alert, non focal  E ENT: no pallor, no icterus, oral mucosa moist Cardiovascular: No JVD. S1-S2 present, rhythmic, no gallops,  rubs, or murmurs. ++/+++ positive lower extremity edema. Pulmonary: positive breath sounds bilaterally,  no wheezing, rhonchi or rales. Mild decreased breath sounds at bases.  Gastrointestinal. Abdomen with no organomegaly, non tender, no rebound or guarding Skin. No rashes Musculoskeletal: no joint deformities     Data Reviewed: I have personally reviewed following labs and imaging studies  CBC: Recent Labs  Lab 07/13/19 1845 07/14/19 0543  WBC 7.9 6.2  NEUTROABS 6.3 4.5  HGB 13.1 12.1*  HCT 42.2 39.6  MCV 94.2 94.3  PLT 242 569   Basic Metabolic Panel: Recent Labs  Lab 07/13/19 1845 07/14/19 0543 07/15/19 1045  NA 137 139 135  K 4.4 4.0 4.0  CL 92* 93* 86*  CO2 30 33* 36*  GLUCOSE 287* 181* 239*  BUN 63* 63* 66*  CREATININE 2.00* 1.83* 1.84*  CALCIUM 9.2 9.3 9.6  MG  --  2.5*  --    GFR: Estimated Creatinine Clearance: 49.1 mL/min (A) (by C-G formula based on SCr of 1.84 mg/dL (H)). Liver Function Tests: Recent Labs  Lab 07/13/19 1845 07/14/19 0543  AST 19 16  ALT 16 14  ALKPHOS 104 85  BILITOT 0.5 0.6  PROT 8.2* 7.2  ALBUMIN 3.7 3.3*   No results for input(s): LIPASE, AMYLASE in the last 168 hours. No results for input(s): AMMONIA in the last 168 hours. Coagulation Profile: No results for input(s): INR, PROTIME in the last 168 hours. Cardiac Enzymes: No results for input(s): CKTOTAL, CKMB, CKMBINDEX, TROPONINI in the last 168 hours. BNP (last 3 results) No results for input(s): PROBNP in the last 8760 hours. HbA1C: No results for input(s): HGBA1C in the last 72 hours. CBG: Recent Labs  Lab 07/14/19 1632 07/14/19 2046 07/15/19 0705 07/15/19 0835 07/15/19 1113  GLUCAP 81 245* 167* 304* 218*   Lipid Profile: No results for input(s): CHOL, HDL, LDLCALC, TRIG, CHOLHDL, LDLDIRECT in the last 72 hours. Thyroid Function Tests: Recent Labs    07/14/19 0543  TSH 1.243   Anemia Panel: No results for input(s): VITAMINB12, FOLATE, FERRITIN,  TIBC, IRON, RETICCTPCT in the last 72 hours.    Radiology Studies: I have reviewed all of the imaging during this hospital visit personally     Scheduled Meds: . aspirin EC  81 mg Oral Daily  . atorvastatin  20 mg Oral QPM  . enoxaparin (LOVENOX) injection  40 mg Subcutaneous Q24H  . escitalopram  20 mg Oral Daily  . fesoterodine  8 mg Oral Daily  . gabapentin  400 mg Oral TID  . hydrALAZINE  25 mg Oral TID  . insulin aspart  0-9 Units Subcutaneous TID WC  . insulin glargine  10 Units Subcutaneous QHS  . levothyroxine  175 mcg Oral Q0600  . metolazone  2.5 mg Oral Q Fri  . multivitamin with minerals  1 tablet Oral QHS  . polyethylene glycol  17 g Oral QHS  . potassium chloride SA  20 mEq Oral BID  . pramipexole  0.5 mg Oral  BID  . spironolactone  25 mg Oral Daily  . tamsulosin  0.4 mg Oral QHS   Continuous Infusions: . furosemide (LASIX) infusion 15 mg/hr (07/15/19 0831)     LOS: 2 days        Deddrick Saindon Gerome Apley, MD

## 2019-07-15 NOTE — Progress Notes (Signed)
Orthopedic Tech Progress Note Patient Details:  Travis [name] 02-21-1946 [id number]  Ortho Devices Type of Ortho Device: Ace wrap, Unna boot Ortho Device/Splint Location: bilateral unna boots Ortho Device/Splint Interventions: Application   Post Interventions Patient Tolerated: Well Instructions Provided: Care of device   [name] 07/15/2019, 5:40 PM

## 2019-07-16 DIAGNOSIS — I5031 Acute diastolic (congestive) heart failure: Secondary | ICD-10-CM

## 2019-07-16 DIAGNOSIS — Z794 Long term (current) use of insulin: Secondary | ICD-10-CM

## 2019-07-16 DIAGNOSIS — E1122 Type 2 diabetes mellitus with diabetic chronic kidney disease: Secondary | ICD-10-CM

## 2019-07-16 DIAGNOSIS — J438 Other emphysema: Secondary | ICD-10-CM

## 2019-07-16 DIAGNOSIS — E039 Hypothyroidism, unspecified: Secondary | ICD-10-CM

## 2019-07-16 LAB — GLUCOSE, CAPILLARY
Glucose-Capillary: 252 mg/dL — ABNORMAL HIGH (ref 70–99)
Glucose-Capillary: 254 mg/dL — ABNORMAL HIGH (ref 70–99)
Glucose-Capillary: 239 mg/dL — ABNORMAL HIGH (ref 70–99)
Glucose-Capillary: 248 mg/dL — ABNORMAL HIGH (ref 70–99)

## 2019-07-16 LAB — BASIC METABOLIC PANEL
Anion gap: 15 (ref 5–15)
BUN: 75 mg/dL — ABNORMAL HIGH (ref 8–23)
CO2: 36 mmol/L — ABNORMAL HIGH (ref 22–32)
Calcium: 9.5 mg/dL (ref 8.9–10.3)
Chloride: 85 mmol/L — ABNORMAL LOW (ref 98–111)
Creatinine, Ser: 1.97 mg/dL — ABNORMAL HIGH (ref 0.61–1.24)
GFR calc Af Amer: 38 mL/min — ABNORMAL LOW (ref >60)
GFR calc non Af Amer: 33 mL/min — ABNORMAL LOW (ref >60)
Glucose, Bld: 215 mg/dL — ABNORMAL HIGH (ref 70–99)
Potassium: 3.7 mmol/L (ref 3.5–5.1)
Sodium: 136 mmol/L (ref 135–145)

## 2019-07-16 LAB — MAGNESIUM: Magnesium: 2.6 mg/dL — ABNORMAL HIGH (ref 1.7–2.4)

## 2019-07-16 MED ORDER — MELATONIN 3 MG PO TABS
3.0000 mg | ORAL_TABLET | Freq: Once | ORAL | Status: AC
  Administered 2019-07-16: 3 mg via ORAL
  Filled 2019-07-16: qty 1

## 2019-07-16 MED ORDER — ACETAZOLAMIDE 250 MG PO TABS
250.0000 mg | ORAL_TABLET | Freq: Two times a day (BID) | ORAL | Status: DC
  Administered 2019-07-16 – 2019-07-18 (×4): 250 mg via ORAL
  Filled 2019-07-16 (×6): qty 1

## 2019-07-16 NOTE — Progress Notes (Addendum)
PROGRESS NOTE    Travis [name]  VPX:[id number] DOB: 07/20/1946 DOA: 07/13/2019 PCP: [name], MD    Brief Narrative:  73 year old male who presented with dyspnea extremity edema. He does have significant past medical history for diastolic heart failure, type 2 diabetes mellitus, COPD, chronic kidney disease and sleep apnea. Patient reported 2-week history of worsening dyspnea lower extremity edema despite increase dose of metolazone at home. On his initial physical examination he was hypoxic, requiring 6 L per nasal cannula, blood pressure 115/61, heart rate 84, respiratory 35, oxygen saturation 98%, his lungs had no rhonchi or rails, heart is also present with me, abdomen soft, lower extremities with compression stockings bilaterally.Sodium 137 potassium 4.4, chloride 91 bicarb 30, glucose 287, BUN 63, creatinine 2.0, white count 7.9, hemoglobin 13.9, hematocrit 42.2, platelets 242. SARS covid 19 negative.Chest film hypoinflated, left base atelectasis. EKG 96 bpm, normal axis, right bundle branch block, no ST segment changes, no T wave changes.  Patient was admitted to the hospitalwith theworking diagnosis of decompensated systolic heart failure.    Assessment & Plan:   Principal Problem:   Acute on chronic diastolic CHF (congestive heart failure) (HCC) Active Problems:   OSA on CPAP   Restless legs syndrome   Hypothyroid   Stage 3 chronic kidney disease (HCC)   Type 2 diabetes mellitus with stage 3 chronic kidney disease, with long-term current use of insulin (HCC)   COPD (chronic obstructive pulmonary disease) (HCC)   Acute CHF (congestive heart failure) ([place])   1. Acute on chronic decompensated diastolic heart failure/ EF 60 to 65%. Urine output over last 24 H is 3,600 ml, stable blood pressure 462 systolic. On IV furosemide drip, spironolactone, metolazone. Added hydralazine and acetazolamide per cardiology.    2. CKD stage 3.  Stable renal function with  serum cr at 1,97 with K at 3,7 and serum bicarbonate at 36. Follow on renal panel in am, avoid hypotension or nephrotoxic medications.  3. COPD. No signs of acute exacerbation.   4. T2DM.Fasting glucose this am at 215 will continue with a reduced basal dose of insulin at 20 units, plus will continue insulin sliding scale.   5. Obesity. BMI is 39,2,    6. Hypothyroid. Continue with levothyroxine.  DVT prophylaxis:heparin Code Status:full Family Communication:no family at the bedside Disposition Plan/ discharge barriers:pending clinical improvement.   Body mass index is 37.99 kg/m. Malnutrition Type:      Malnutrition Characteristics:      Nutrition Interventions:     RN Pressure Injury Documentation:   Consultants:   Cardiology   Procedures:     Antimicrobials:       Subjective: Patient continue to feel better, but not yet back to his baseline, no nausea or vomiting, continue to have dyspnea and lower extremity edema, has not been ambulating yet. At home uses walker.   Objective: Vitals:   07/15/19 1313 07/15/19 1656 07/15/19 2049 07/16/19 0443  BP: 124/62 123/83 (!) 142/71 (!) 127/51  Pulse: 72 71 78 69  Resp: 20 16    Temp: 98.1 F (36.7 C) 98.1 F (36.7 C) 98 F (36.7 C) (!) 97.5 F (36.4 C)  TempSrc: Oral Oral Oral Oral  SpO2: 94% 95% 94% 95%  Weight:    130.6 kg  Height:        Intake/Output Summary (Last 24 hours) at 07/16/2019 1255 Last data filed at 07/16/2019 1135 Gross per 24 hour  Intake 792.99 ml  Output 3250 ml  Net -2457.01 ml  Filed Weights   07/14/19 0434 07/15/19 0608 07/16/19 0443  Weight: 135 kg 126.4 kg 130.6 kg    Examination:   General: deconditioned  Neurology: Awake and alert, non focal  E ENT: mild pallor, no icterus, oral mucosa moist Cardiovascular: No JVD. S1-S2 present, rhythmic, no gallops, rubs, or murmurs. ++  lower extremity edema/ compression elastic compression bilaterally. Pulmonary:  positive breath sounds bilaterally, adequate air movement, no wheezing, rhonchi or rales. Decreased breath sounds at bases Gastrointestinal. Abdomen with no organomegaly, non tender, no rebound or guarding Skin. No rashes Musculoskeletal: no joint deformities     Data Reviewed: I have personally reviewed following labs and imaging studies  CBC: Recent Labs  Lab 07/13/19 1845 07/14/19 0543  WBC 7.9 6.2  NEUTROABS 6.3 4.5  HGB 13.1 12.1*  HCT 42.2 39.6  MCV 94.2 94.3  PLT 242 086   Basic Metabolic Panel: Recent Labs  Lab 07/13/19 1845 07/14/19 0543 07/15/19 1045 07/16/19 0334  NA 137 139 135 136  K 4.4 4.0 4.0 3.7  CL 92* 93* 86* 85*  CO2 30 33* 36* 36*  GLUCOSE 287* 181* 239* 215*  BUN 63* 63* 66* 75*  CREATININE 2.00* 1.83* 1.84* 1.97*  CALCIUM 9.2 9.3 9.6 9.5  MG  --  2.5*  --  2.6*   GFR: Estimated Creatinine Clearance: 46.6 mL/min (A) (by C-G formula based on SCr of 1.97 mg/dL (H)). Liver Function Tests: Recent Labs  Lab 07/13/19 1845 07/14/19 0543  AST 19 16  ALT 16 14  ALKPHOS 104 85  BILITOT 0.5 0.6  PROT 8.2* 7.2  ALBUMIN 3.7 3.3*   No results for input(s): LIPASE, AMYLASE in the last 168 hours. No results for input(s): AMMONIA in the last 168 hours. Coagulation Profile: No results for input(s): INR, PROTIME in the last 168 hours. Cardiac Enzymes: No results for input(s): CKTOTAL, CKMB, CKMBINDEX, TROPONINI in the last 168 hours. BNP (last 3 results) No results for input(s): PROBNP in the last 8760 hours. HbA1C: No results for input(s): HGBA1C in the last 72 hours. CBG: Recent Labs  Lab 07/15/19 1113 07/15/19 1605 07/15/19 2117 07/16/19 0731 07/16/19 1124  GLUCAP 218* 253* 214* 252* 254*   Lipid Profile: No results for input(s): CHOL, HDL, LDLCALC, TRIG, CHOLHDL, LDLDIRECT in the last 72 hours. Thyroid Function Tests: Recent Labs    07/14/19 0543  TSH 1.243   Anemia Panel: No results for input(s): VITAMINB12, FOLATE, FERRITIN,  TIBC, IRON, RETICCTPCT in the last 72 hours.    Radiology Studies: I have reviewed all of the imaging during this hospital visit personally     Scheduled Meds: . aspirin EC  81 mg Oral Daily  . atorvastatin  20 mg Oral QPM  . enoxaparin (LOVENOX) injection  40 mg Subcutaneous Q24H  . escitalopram  20 mg Oral Daily  . fesoterodine  8 mg Oral Daily  . gabapentin  400 mg Oral TID  . hydrALAZINE  25 mg Oral TID  . insulin aspart  0-9 Units Subcutaneous TID WC  . insulin glargine  20 Units Subcutaneous QHS  . levothyroxine  175 mcg Oral Q0600  . metolazone  2.5 mg Oral Q Fri  . multivitamin with minerals  1 tablet Oral QHS  . pneumococcal 23 valent vaccine  0.5 mL Intramuscular Tomorrow-1000  . polyethylene glycol  17 g Oral QHS  . potassium chloride SA  20 mEq Oral BID  . pramipexole  0.5 mg Oral BID  . spironolactone  25 mg Oral  Daily  . tamsulosin  0.4 mg Oral QHS   Continuous Infusions: . furosemide (LASIX) infusion 10 mg/hr (07/16/19 1248)     LOS: 3 days        Mauricio Gerome Apley, MD

## 2019-07-16 NOTE — Progress Notes (Addendum)
Progress Note  Patient Name: Travis [name] Date of Encounter: 07/16/2019  Primary Cardiologist: Mertie Moores, MD/D. Aundra Dubin, MD   Subjective   Breathing slowly improving.  No chest pain.  Didn't sleep well last night - 'just couldn't wind down.' No orthopnea or PND.   Inpatient Medications    Scheduled Meds: . aspirin EC  81 mg Oral Daily  . atorvastatin  20 mg Oral QPM  . enoxaparin (LOVENOX) injection  40 mg Subcutaneous Q24H  . escitalopram  20 mg Oral Daily  . fesoterodine  8 mg Oral Daily  . gabapentin  400 mg Oral TID  . hydrALAZINE  25 mg Oral TID  . insulin aspart  0-9 Units Subcutaneous TID WC  . insulin glargine  20 Units Subcutaneous QHS  . levothyroxine  175 mcg Oral Q0600  . metolazone  2.5 mg Oral Q Fri  . multivitamin with minerals  1 tablet Oral QHS  . pneumococcal 23 valent vaccine  0.5 mL Intramuscular Tomorrow-1000  . polyethylene glycol  17 g Oral QHS  . potassium chloride SA  20 mEq Oral BID  . pramipexole  0.5 mg Oral BID  . spironolactone  25 mg Oral Daily  . tamsulosin  0.4 mg Oral QHS   Continuous Infusions: . furosemide (LASIX) infusion 15 mg/hr (07/16/19 0202)   PRN Meds: acetaminophen **OR** acetaminophen, ondansetron **OR** ondansetron (ZOFRAN) IV, pramipexole   Vital Signs    Vitals:   07/15/19 1313 07/15/19 1656 07/15/19 2049 07/16/19 0443  BP: 124/62 123/83 (!) 142/71 (!) 127/51  Pulse: 72 71 78 69  Resp: 20 16    Temp: 98.1 F (36.7 C) 98.1 F (36.7 C) 98 F (36.7 C) (!) 97.5 F (36.4 C)  TempSrc: Oral Oral Oral Oral  SpO2: 94% 95% 94% 95%  Weight:    130.6 kg  Height:        Intake/Output Summary (Last 24 hours) at 07/16/2019 0959 Last data filed at 07/16/2019 0859 Gross per 24 hour  Intake 1014.99 ml  Output 3350 ml  Net -2335.01 ml   Filed Weights   07/14/19 0434 07/15/19 0608 07/16/19 0443  Weight: 135 kg 126.4 kg 130.6 kg    Physical Exam   GEN: Obese, in no acute distress.  HEENT: Grossly normal.  Neck:  Supple, obese, JVP ~ 12 cm, no carotid bruits, or masses. Cardiac: RRR, distant, no murmurs, rubs, or gallops. No clubbing, cyanosis.  1+ bilat LE edema w/ unna boots in place.  Radials 2+ and equal bilaterally.  Respiratory:  Respirations regular and unlabored, diminished breath sounds bilat. GI: Obese, semi-firm, nontender, nondistended, BS + x 4. MS: no deformity or atrophy. Skin: warm and dry, no rash. Neuro:  Strength and sensation are intact. Psych: AAOx3.  Normal affect.  Labs    Chemistry Recent Labs  Lab 07/13/19 1845 07/14/19 0543 07/15/19 1045 07/16/19 0334  NA 137 139 135 136  K 4.4 4.0 4.0 3.7  CL 92* 93* 86* 85*  CO2 30 33* 36* 36*  GLUCOSE 287* 181* 239* 215*  BUN 63* 63* 66* 75*  CREATININE 2.00* 1.83* 1.84* 1.97*  CALCIUM 9.2 9.3 9.6 9.5  PROT 8.2* 7.2  --   --   ALBUMIN 3.7 3.3*  --   --   AST 19 16  --   --   ALT 16 14  --   --   ALKPHOS 104 85  --   --   BILITOT 0.5 0.6  --   --  GFRNONAA 32* 36* 35* 33*  GFRAA 37* 41* 41* 38*  ANIONGAP 15 13 13 15      Hematology Recent Labs  Lab 07/13/19 1845 07/14/19 0543  WBC 7.9 6.2  RBC 4.48 4.20*  HGB 13.1 12.1*  HCT 42.2 39.6  MCV 94.2 94.3  MCH 29.2 28.8  MCHC 31.0 30.6  RDW 15.9* 15.9*  PLT 242 220   BNP Recent Labs  Lab 07/13/19 1845  BNP 45.7     Radiology    No results found.  Telemetry    Sinus rhythm, rare pvcs/couplets - Personally Reviewed  Cardiac Studies   2D Echocardiogram 5.3.2020  1. The left ventricle has normal systolic function, with an ejection fraction of 60-65%. Left ventricular diastolic Doppler parameters are consistent with indeterminate diastolic dysfunction. No evidence of left ventricular regional wall motion  abnormalities.  2. The right ventricle has normal systolc function. The cavity was normal. There is no increase in right ventricular wall thickness. Right ventricular systolic pressure could not be assessed.  3. Left atrial size was not well  visualized.  4. The mitral valve is grossly normal.  5. The aortic valve is tricuspid.  6. The aortic root is normal in size and structure.  7. The inferior vena cava was dilated in size with <50% respiratory variability.  8. The interatrial septum was not well visualized.  Patient Profile     73 y.o. male w/ a h/o HFpEF, COPD, CKD III, nonobs CAD, OSA, and DMII, who was admitted 07/13/2019 w/ progressive weight gain, dyspnea, and swelling.  Assessment & Plan    1.  Acute on chronic diastolic CHF:  Nl LV fxn by echo 04/2019.  Had PYP scan 11/2019with Grade 1, H/CLL 1.45 - determined to be negative.Myeloma panel with no M-spike, normal IFE>>per chart review  Presented 7/15 w/ progressively worsening dyspnea and LE edema.  Wt 135 kg on admission.  He has responded well to IV lasix gtt - minus 2.58L yesterday and 4.57L since admission. Wt was 135 kg on admission, but then listed @ 126.4 kg yesterday and 130.6 kg today.  Suspect yesterday was erroneous.  I/staff helped him to a standing scale and he was 131.8kg  Review of wts over the past 6 mos shows that dry wt closer to 125 kg.  BUN/Creat bumping, now 75/1.97.  Bicarb stable @ 36.  Will reduce lasix gtt to 10mg /hr.  He did receive metolazone 7/16 and 7/17.  Will d/w Dr. [name].  Was on torsemide 80 bid @ home.  2.  Chronic hypoxic respiratory failure/COPD: Mod hypoxic on presentation req 6l [place].  Has since been stable, predominantly in the mid 90's on 3 lpm - usually uses 2lpm 2 home.  3.  CKD III: BUN/Creat 75/1.97, up from 66/1.84 yesterday.  Bicarb stable @ 36 x 2 days.  Will adjust diuretic regimen as above.  4.  Nonobs CAD: Cath 08/2015 w/ LM 40, LAD 40d, LCX 50ost, RCA 40p  med mgmt since.  NO c/p.  Cont asa/statin.  NO  blocker 2/2 COPD.  5.  OSA:  On home BiPAP.  6.  DMII:  A1c 10.1 in 04/2019.  CBGs trending in low to mid 200's over past 24 hrs.  Prev intol to [place] 2/2 UTI/rash.  Mgmt per IM.  Murray Hodgkins, NP 07/16/2019,  10:02 AM   Patient seen and examined with the above-signed Advanced Practice Provider and/or Housestaff. I personally reviewed laboratory data, imaging studies and relevant notes. I independently examined the patient  and formulated the important aspects of the plan. I have edited the note to reflect any of my changes or salient points. I have personally discussed the plan with the patient and/or family.  Volume status hard to assess accurately given body habitus but seems overloaded on exam and weight up from previous baseline. Creatinine up from yesterday but still in line with baseline. Lasix gtt decreased but will add acetazolamide to see if this supports diuresis. Follow renal function closely.   Glori Bickers, MD  2:21 PM

## 2019-07-16 NOTE — Evaluation (Signed)
Physical Therapy Evaluation Patient Details Name: Travis [name] MRN: [id number] DOB: 01-Dec-1946 Today's Date: 07/16/2019   History of Present Illness  73 y.o. male with history of diastolic CHF last EF measured in May 2020 was 8 to 65% with history of diabetes mellitus type 2, sleep apnea, COPD, chronic kidney disease has been experiencing increasing shortness of breath and lower extremity edema. Pt hypoxic in ED requiring 6L O2. Admitted 07/13/19 for treatment of acute on chronic diastolic CHF.    Clinical Impression  Pt known to therapist from prior hospitalization. PTA pt reports working with HHPT on powering up to his RW as well as progressing his ambulation. Pt's wife continues to aide with ADLs. Pt is currently limited in safe mobility by decreased ROM of LE, decreased quad and glute strength and decreased balance. Pt requires modAx2 for power up to [facility]. Attempted standing at RW but unable to achieve due to decreased ability to flex knees and decreased strength. PT would recommend SNF level rehab at discharge but is aware that pt will refuse and he will discharge home. PT recommends continued rehab with his current HHPT to improve his strength and mobility in his home environment. PT will continue to follow acutely.    Follow Up Recommendations Home health PT;Supervision - Intermittent    Equipment Recommendations  None recommended by PT       Precautions / Restrictions Precautions Precautions: Fall Restrictions Weight Bearing Restrictions: No      Mobility  Bed Mobility               General bed mobility comments: OOB in recliner on entry   Transfers Overall transfer level: Needs assistance Equipment used: Rolling walker (2 wheeled) Transfers: Sit to/from Stand Sit to Stand: Total assist;+2 physical assistance;Mod assist         General transfer comment: pt wanted to attempt to stand to RW, decreased knee flexion and quad strength prohibited coming to standing.  pt able to stand up to [facility] with modAx2. Once in [place], pt able to power up with min guard from elevated [place] pads. Pt able to statically stand in [place] for 1x3 minutes and 1x2 min before returning to recliner        Balance Overall balance assessment: Needs assistance Sitting-balance support: No upper extremity supported;Feet supported Sitting balance-Leahy Scale: Fair     Standing balance support: Bilateral upper extremity supported Standing balance-Leahy Scale: Poor Standing balance comment: pt requires UE support from [facility] but able to statically stand without UE support for 10 sec before having to reach back to [facility]                             Pertinent Vitals/Pain Pain Assessment: No/denies pain    Home Living Family/patient expects to be discharged to:: Private residence Living Arrangements: Spouse/significant other;Children Available Help at Discharge: Family;Available 24 hours/day;Personal care attendant Type of Home: House Home Access: Ramped entrance     Home Layout: One level Home Equipment: [place] - 2 wheels;Walker - 4 wheels;Cane - single point;Bedside commode;Wheelchair - manual;Shower seat - built in;Grab bars - tub/shower;Hospital bed;Other (comment);Grab bars - toilet Additional Comments: 2x/wk aide for 1 hour.    Prior Function Level of Independence: Needs assistance   Gait / Transfers Assistance Needed: pt reports working with HHPT on powering up to standing and was able to ambulate household distances once in standing  ADL's / Homemaking Assistance Needed: Pt wife assists with  ADL's including bathing and dressing           Extremity/Trunk Assessment   Upper Extremity Assessment Upper Extremity Assessment: Overall WFL for tasks assessed    Lower Extremity Assessment Lower Extremity Assessment: RLE deficits/detail;LLE deficits/detail RLE Deficits / Details: decreased knee and ankle ROM secondary to edema, UNNA boot applied, strength  of quads and hips continues to be weak for power up from low surfaces RLE Sensation: decreased light touch RLE Coordination: decreased fine motor;decreased gross motor LLE Deficits / Details: decreased knee and ankle ROM secondary to edema, UNNA boot applied, strength of quads and hips continues to be weak for power up from low surfaces LLE Sensation: decreased light touch LLE Coordination: decreased fine motor;decreased gross motor    Cervical / Trunk Assessment Cervical / Trunk Assessment: Kyphotic  Communication   Communication: No difficulties  Cognition Arousal/Alertness: Awake/alert Behavior During Therapy: WFL for tasks assessed/performed Overall Cognitive Status: Within Functional Limits for tasks assessed                                        General Comments General comments (skin integrity, edema, etc.): Bilateral LE wrapped in Unna boots, Pt on 3L O2 via [place] and able to maintain SaO2 >92%O2 throughout session. HR max with standing 102bpm     Assessment/Plan    PT Assessment Patient needs continued PT services  PT Problem List Decreased strength;Decreased range of motion;Decreased activity tolerance;Decreased balance;Decreased mobility;Decreased coordination;Cardiopulmonary status limiting activity;Impaired sensation;Pain       PT Treatment Interventions DME instruction;Gait training;Functional mobility training;Therapeutic activities;Therapeutic exercise;Balance training;Cognitive remediation;Patient/family education    PT Goals (Current goals can be found in the Care Plan section)  Acute Rehab PT Goals Patient Stated Goal: go home PT Goal Formulation: With patient Time For Goal Achievement: 07/30/19 Potential to Achieve Goals: Fair    Frequency Min 3X/week    AM-PAC PT "6 Clicks" Mobility  Outcome Measure Help needed turning from your back to your side while in a flat bed without using bedrails?: A Little Help needed moving from lying on your  back to sitting on the side of a flat bed without using bedrails?: A Little Help needed moving to and from a bed to a chair (including a wheelchair)?: Total Help needed standing up from a chair using your arms (e.g., wheelchair or bedside chair)?: Total Help needed to walk in hospital room?: Total Help needed climbing 3-5 steps with a railing? : Total 6 Click Score: 10    End of Session Equipment Utilized During Treatment: Gait belt;Oxygen Activity Tolerance: Patient tolerated treatment well Patient left: in chair;with call bell/phone within reach Nurse Communication: Mobility status PT Visit Diagnosis: Unsteadiness on feet (R26.81);Other abnormalities of gait and mobility (R26.89);Muscle weakness (generalized) (M62.81);Difficulty in walking, not elsewhere classified (R26.2)    Time: 3343-5686 PT Time Calculation (min) (ACUTE ONLY): 31 min   Charges:   PT Evaluation $PT Eval Moderate Complexity: 1 Mod PT Treatments $Therapeutic Activity: 8-22 mins        Caprice Wasko B. Migdalia Dk PT, DPT Acute Rehabilitation Services Pager [phone] Office [phone]   [place] 07/16/2019, 2:46 PM

## 2019-07-17 ENCOUNTER — Inpatient Hospital Stay: Payer: Self-pay

## 2019-07-17 LAB — GLUCOSE, CAPILLARY
Glucose-Capillary: 202 mg/dL — ABNORMAL HIGH (ref 70–99)
Glucose-Capillary: 323 mg/dL — ABNORMAL HIGH (ref 70–99)
Glucose-Capillary: 227 mg/dL — ABNORMAL HIGH (ref 70–99)
Glucose-Capillary: 212 mg/dL — ABNORMAL HIGH (ref 70–99)

## 2019-07-17 LAB — BASIC METABOLIC PANEL
Anion gap: 15 (ref 5–15)
BUN: 85 mg/dL — ABNORMAL HIGH (ref 8–23)
CO2: 35 mmol/L — ABNORMAL HIGH (ref 22–32)
Calcium: 9.8 mg/dL (ref 8.9–10.3)
Chloride: 85 mmol/L — ABNORMAL LOW (ref 98–111)
Creatinine, Ser: 2.01 mg/dL — ABNORMAL HIGH (ref 0.61–1.24)
GFR calc Af Amer: 37 mL/min — ABNORMAL LOW (ref >60)
GFR calc non Af Amer: 32 mL/min — ABNORMAL LOW (ref >60)
Glucose, Bld: 225 mg/dL — ABNORMAL HIGH (ref 70–99)
Potassium: 4 mmol/L (ref 3.5–5.1)
Sodium: 135 mmol/L (ref 135–145)

## 2019-07-17 MED ORDER — INSULIN GLARGINE 100 UNIT/ML ~~LOC~~ SOLN
30.0000 [IU] | Freq: Every day | SUBCUTANEOUS | Status: DC
  Administered 2019-07-17: 30 [IU] via SUBCUTANEOUS
  Filled 2019-07-17 (×2): qty 0.3

## 2019-07-17 MED ORDER — SODIUM CHLORIDE 0.9% FLUSH: 3.0000 mL | INTRAVENOUS | Status: DC | PRN

## 2019-07-17 MED ORDER — ASPIRIN 81 MG PO CHEW
81.0000 mg | CHEWABLE_TABLET | ORAL | Status: AC
  Administered 2019-07-18: 81 mg via ORAL
  Filled 2019-07-17: qty 1

## 2019-07-17 MED ORDER — SODIUM CHLORIDE 0.9 % IV SOLN: 250.0000 mL | INTRAVENOUS | Status: DC | PRN

## 2019-07-17 MED ORDER — SODIUM CHLORIDE 0.9% FLUSH
3.0000 mL | Freq: Two times a day (BID) | INTRAVENOUS | Status: DC
  Administered 2019-07-17: 3 mL via INTRAVENOUS

## 2019-07-17 MED ORDER — SODIUM CHLORIDE 0.9 % IV SOLN
INTRAVENOUS | Status: DC
  Administered 2019-07-18: 07:00:00 via INTRAVENOUS

## 2019-07-17 NOTE — Progress Notes (Addendum)
PROGRESS NOTE    Travis [name]  IRC:[id number] DOB: 11-29-1946 DOA: 07/13/2019 PCP: [name], MD    Brief Narrative:  73 year old male who presented with dyspnea extremity edema. He does have significant past medical history for diastolic heart failure, type 2 diabetes mellitus, COPD, chronic kidney disease and sleep apnea. Patient reported 2-week history of worsening dyspnea lower extremity edema despite increase dose of metolazone at home. On his initial physical examination he was hypoxic, requiring 6 L per nasal cannula, blood pressure 115/61, heart rate 84, respiratory 35, oxygen saturation 98%, his lungs had no rhonchi or rails, heart is also present with me, abdomen soft, lower extremities with compression stockings bilaterally.Sodium 137 potassium 4.4, chloride 91 bicarb 30, glucose 287, BUN 63, creatinine 2.0, white count 7.9, hemoglobin 13.9, hematocrit 42.2, platelets 242. SARS covid 19 negative.Chest film hypoinflated, left base atelectasis. EKG 96 bpm, normal axis, right bundle branch block, no ST segment changes, no T wave changes.  Patient was admitted to the hospitalwith theworking diagnosis of decompensated systolic heart failure.  Patient has been responding well to aggressive diuresis with IV furosemide.   Assessment & Plan:   Principal Problem:   Acute on chronic diastolic CHF (congestive heart failure) (HCC) Active Problems:   OSA on CPAP   Restless legs syndrome   Hypothyroid   Stage 3 chronic kidney disease (HCC)   Type 2 diabetes mellitus with stage 3 chronic kidney disease, with long-term current use of insulin (HCC)   COPD (chronic obstructive pulmonary disease) (HCC)   Acute CHF (congestive heart failure) ([place])    1. Acute on chronic decompensated diastolic heart failure/ EF 60 to 65%.Urine output over last 24 H is 1,588 ml, stable blood pressure 510 systolic.Continue with spironolactone, metolazone and acetazolamide. Tolerating well after  load reduction with hydralazine. Plan for right heart cath in am.     2. CKD stage 3 with contraction alkalosis. Continue stable renal function with serum cr at 2,01 with K at 4.0 and serum bicarbonate at 35.   3. COPD.No clinical signs of acute exacerbation.   4. T2DM.Fasting glucose this am at 225, capillary up to 323, will continue to increase basal insulin up to 30 units, plus will continue insulin sliding scale.At home on 60 units of basal insulin.   5. Obesity.BMI is 38, will need outpatient follow up.    6. Hypothyroid.On levothyroxine.  DVT prophylaxis:heparin Code Status:full Family Communication:no family at the bedside Disposition Plan/ discharge barriers:pending clinical improvement.  Body mass index is 38.01 kg/m. Malnutrition Type:      Malnutrition Characteristics:      Nutrition Interventions:     RN Pressure Injury Documentation:    Consultants:   Cardiology   Procedures:     Antimicrobials:       Subjective: Patient continue to improve dyspnea and edema, but not yet back to baseline, no nausea or vomiting, no chest pain.   Objective: Vitals:   07/16/19 2046 07/17/19 0004 07/17/19 0529 07/17/19 0715  BP: (!) 148/70 131/63 134/61   Pulse: 74 70 70   Resp:   20   Temp: 97.7 F (36.5 C) 98.1 F (36.7 C) 98.7 F (37.1 C)   TempSrc: Oral Oral Oral   SpO2: 91% 91% 94%   Weight:    130.7 kg  Height:        Intake/Output Summary (Last 24 hours) at 07/17/2019 1100 Last data filed at 07/17/2019 0900 Gross per 24 hour  Intake 1568.03 ml  Output 1700 ml  Net -131.97 ml   Filed Weights   07/15/19 0608 07/16/19 0443 07/17/19 0715  Weight: 126.4 kg 130.6 kg 130.7 kg    Examination:   General: deconditioned  Neurology: Awake and alert, non focal  E ENT: mild pallor, no icterus, oral mucosa moist Cardiovascular: No JVD. S1-S2 present, rhythmic, no gallops, rubs, or murmurs. No lower extremity edema. Pulmonary:  vpositive breath sounds bilaterally, decreased air movement, no wheezing, rhonchi or rales. Mild decreased breath sounds at bases.  Gastrointestinal. Abdomen with no organomegaly, non tender, no rebound or guarding Skin. No rashes Musculoskeletal: no joint deformities     Data Reviewed: I have personally reviewed following labs and imaging studies  CBC: Recent Labs  Lab 07/13/19 1845 07/14/19 0543  WBC 7.9 6.2  NEUTROABS 6.3 4.5  HGB 13.1 12.1*  HCT 42.2 39.6  MCV 94.2 94.3  PLT 242 027   Basic Metabolic Panel: Recent Labs  Lab 07/13/19 1845 07/14/19 0543 07/15/19 1045 07/16/19 0334 07/17/19 0748  NA 137 139 135 136 135  K 4.4 4.0 4.0 3.7 4.0  CL 92* 93* 86* 85* 85*  CO2 30 33* 36* 36* 35*  GLUCOSE 287* 181* 239* 215* 225*  BUN 63* 63* 66* 75* 85*  CREATININE 2.00* 1.83* 1.84* 1.97* 2.01*  CALCIUM 9.2 9.3 9.6 9.5 9.8  MG  --  2.5*  --  2.6*  --    GFR: Estimated Creatinine Clearance: 45.7 mL/min (A) (by C-G formula based on SCr of 2.01 mg/dL (H)). Liver Function Tests: Recent Labs  Lab 07/13/19 1845 07/14/19 0543  AST 19 16  ALT 16 14  ALKPHOS 104 85  BILITOT 0.5 0.6  PROT 8.2* 7.2  ALBUMIN 3.7 3.3*   No results for input(s): LIPASE, AMYLASE in the last 168 hours. No results for input(s): AMMONIA in the last 168 hours. Coagulation Profile: No results for input(s): INR, PROTIME in the last 168 hours. Cardiac Enzymes: No results for input(s): CKTOTAL, CKMB, CKMBINDEX, TROPONINI in the last 168 hours. BNP (last 3 results) No results for input(s): PROBNP in the last 8760 hours. HbA1C: No results for input(s): HGBA1C in the last 72 hours. CBG: Recent Labs  Lab 07/16/19 0731 07/16/19 1124 07/16/19 1641 07/16/19 2101 07/17/19 0728  GLUCAP 252* 254* 239* 248* 202*   Lipid Profile: No results for input(s): CHOL, HDL, LDLCALC, TRIG, CHOLHDL, LDLDIRECT in the last 72 hours. Thyroid Function Tests: No results for input(s): TSH, T4TOTAL, FREET4, T3FREE,  THYROIDAB in the last 72 hours. Anemia Panel: No results for input(s): VITAMINB12, FOLATE, FERRITIN, TIBC, IRON, RETICCTPCT in the last 72 hours.    Radiology Studies: I have reviewed all of the imaging during this hospital visit personally     Scheduled Meds: . acetaZOLAMIDE  250 mg Oral BID  . aspirin EC  81 mg Oral Daily  . atorvastatin  20 mg Oral QPM  . enoxaparin (LOVENOX) injection  40 mg Subcutaneous Q24H  . escitalopram  20 mg Oral Daily  . fesoterodine  8 mg Oral Daily  . gabapentin  400 mg Oral TID  . hydrALAZINE  25 mg Oral TID  . insulin aspart  0-9 Units Subcutaneous TID WC  . insulin glargine  20 Units Subcutaneous QHS  . levothyroxine  175 mcg Oral Q0600  . metolazone  2.5 mg Oral Q Fri  . multivitamin with minerals  1 tablet Oral QHS  . pneumococcal 23 valent vaccine  0.5 mL Intramuscular Tomorrow-1000  . polyethylene glycol  17 g Oral QHS  .  potassium chloride SA  20 mEq Oral BID  . pramipexole  0.5 mg Oral BID  . spironolactone  25 mg Oral Daily  . tamsulosin  0.4 mg Oral QHS   Continuous Infusions: . furosemide (LASIX) infusion 10 mg/hr (07/17/19 0235)     LOS: 4 days        Maybell Misenheimer Gerome Apley, MD

## 2019-07-17 NOTE — Progress Notes (Addendum)
Progress Note  Patient Name: Travis [name] Date of Encounter: 07/17/2019  Primary Cardiologist: Mertie Moores, MD/D. Aundra Dubin, MD   Subjective   He says that he thinks breathing has improved. Down to 2lpm, which is what he uses @ home (@ night only).  Wt hasn't really changed and output poor yesterday - +82ml for the day.    Inpatient Medications    Scheduled Meds: . acetaZOLAMIDE  250 mg Oral BID  . aspirin EC  81 mg Oral Daily  . atorvastatin  20 mg Oral QPM  . enoxaparin (LOVENOX) injection  40 mg Subcutaneous Q24H  . escitalopram  20 mg Oral Daily  . fesoterodine  8 mg Oral Daily  . gabapentin  400 mg Oral TID  . hydrALAZINE  25 mg Oral TID  . insulin aspart  0-9 Units Subcutaneous TID WC  . insulin glargine  20 Units Subcutaneous QHS  . levothyroxine  175 mcg Oral Q0600  . metolazone  2.5 mg Oral Q Fri  . multivitamin with minerals  1 tablet Oral QHS  . pneumococcal 23 valent vaccine  0.5 mL Intramuscular Tomorrow-1000  . polyethylene glycol  17 g Oral QHS  . potassium chloride SA  20 mEq Oral BID  . pramipexole  0.5 mg Oral BID  . spironolactone  25 mg Oral Daily  . tamsulosin  0.4 mg Oral QHS   Continuous Infusions: . furosemide (LASIX) infusion 10 mg/hr (07/17/19 0235)   PRN Meds: acetaminophen **OR** acetaminophen, ondansetron **OR** ondansetron (ZOFRAN) IV, pramipexole   Vital Signs    Vitals:   07/17/19 0004 07/17/19 0529 07/17/19 0715 07/17/19 1113  BP: 131/63 134/61  127/63  Pulse: 70 70    Resp:  20    Temp: 98.1 F (36.7 C) 98.7 F (37.1 C)    TempSrc: Oral Oral    SpO2: 91% 94%    Weight:   130.7 kg   Height:        Intake/Output Summary (Last 24 hours) at 07/17/2019 1153 Last data filed at 07/17/2019 1100 Gross per 24 hour  Intake 1748.03 ml  Output 1600 ml  Net 148.03 ml   Filed Weights   07/15/19 0608 07/16/19 0443 07/17/19 0715  Weight: 126.4 kg 130.6 kg 130.7 kg    Physical Exam   GEN: Obese, in no acute distress.  HEENT:  Grossly normal.  Neck: Supple, obese, JVP hard to see  No carotid bruits, or masses. Cardiac: RRR, no murmurs, rubs, or gallops. No clubbing, cyanosis.  Unna boots on - 1+ bilat LE edema.  Radials 2+ and equal bilaterally.  Respiratory:  Respirations regular and unlabored, bibasilar crackles.   GI: Obese, semi-firm, nontender, BS + x 4. MS: no deformity or atrophy. Skin: warm and dry, no rash. Neuro:  Strength and sensation are intact. Psych: AAOx3.  Normal affect.  Labs    Chemistry Recent Labs  Lab 07/13/19 1845 07/14/19 0543 07/15/19 1045 07/16/19 0334 07/17/19 0748  NA 137 139 135 136 135  K 4.4 4.0 4.0 3.7 4.0  CL 92* 93* 86* 85* 85*  CO2 30 33* 36* 36* 35*  GLUCOSE 287* 181* 239* 215* 225*  BUN 63* 63* 66* 75* 85*  CREATININE 2.00* 1.83* 1.84* 1.97* 2.01*  CALCIUM 9.2 9.3 9.6 9.5 9.8  PROT 8.2* 7.2  --   --   --   ALBUMIN 3.7 3.3*  --   --   --   AST 19 16  --   --   --  ALT 16 14  --   --   --   ALKPHOS 104 85  --   --   --   BILITOT 0.5 0.6  --   --   --   GFRNONAA 32* 36* 35* 33* 32*  GFRAA 37* 41* 41* 38* 37*  ANIONGAP 15 13 13 15 15      Hematology Recent Labs  Lab 07/13/19 1845 07/14/19 0543  WBC 7.9 6.2  RBC 4.48 4.20*  HGB 13.1 12.1*  HCT 42.2 39.6  MCV 94.2 94.3  MCH 29.2 28.8  MCHC 31.0 30.6  RDW 15.9* 15.9*  PLT 242 220   BNP Recent Labs  Lab 07/13/19 1845  BNP 45.7     Radiology    No results found.  Telemetry    RSR, rare PVCs - Personally Reviewed  Cardiac Studies   2D Echocardiogram 5.3.2020  1. The left ventricle has normal systolic function, with an ejection fraction of 60-65%. Left ventricular diastolic Doppler parameters are consistent with indeterminate diastolic dysfunction. No evidence of left ventricular regional wall motion  abnormalities. 2. The right ventricle has normal systolc function. The cavity was normal. There is no increase in right ventricular wall thickness. Right ventricular systolic pressure could  not be assessed. 3. Left atrial size was not well visualized. 4. The mitral valve is grossly normal. 5. The aortic valve is tricuspid. 6. The aortic root is normal in size and structure. 7. The inferior vena cava was dilated in size with <50% respiratory variability. 8. The interatrial septum was not well visualized.  Patient Profile     73 y.o. male w/ a h/o HFpEF, COPD, CKD III, nonobs CAD, OSA, and DMII, who was admitted 07/13/2019 w/ progressive weight gain, dyspnea, and swelling.  Assessment & Plan    1.  Acute on chronic diastolic CHF:  Nl LV fxn by echo in 04/2019.  PYP scan 10/2018 w/ Grade 1, H/CLL 1.45 - determined to be negative.  Myeloma panel w/ no M-spike, nl IFE>> per chart review.  Presented 07/13/2019 w/ progressively worsening dyspnea and LE edema.  Admit wt 135kg.  Dry wt closer to 125 kg.  Initially responded well to lasix gtt @ 20mg /hr  reduced to 10mg /hr and acetazolamide added 7/18 due to rising creat w/ ongoing significant volume excess.  Net pos 13.1 ml 7/18 and wt flat this morning - 130.7 kg.  BUN/Creat bumping further this AM @ 85/2.01 (75/1.97 yesterday).  Bicarb steady @ 35.  Difficult situation as remains volume overloaded and body habitus makes accurate assessment of volume difficult.  Will d/w Dr. [name] - may need to place PICC to assess CVP and co-ox.  HR/BP stable.  2.  Chronic hypoxic resp failure/COPD:  Moderately hypoxic on presentation req 6l [place].  Has since been stable and now on 2 lpm.  Uses 2lpm @ home @ night only.  3.  CKD III w/ AKI in the setting of diuresis:  As above, BUN/Creat higher this AM @ 85/2.01.  Bicarb stable.  See #1.  4.  Nonobs CAD:  Cath 08/2015 w/ nonobs multivessel dzs.  No c/p.  Cont asa/statin. No  blocker 2/2 COPD.  5.  OSA:  Using BiPAP HS.  6.  DMII:  A1c 10.1 in May.  Per IM.  Prev intol to jardiance 2/2 UTI/rash.  Signed, Murray Hodgkins, NP  07/17/2019, 11:53 AM    For questions or updates, please contact    Please consult [email or web address] for contact info under Cardiology/STEMI.  Patient seen and examined with the above-signed Advanced Practice Provider and/or Housestaff. I personally reviewed laboratory data, imaging studies and relevant notes. I independently examined the patient and formulated the important aspects of the plan. I have edited the note to reflect any of my changes or salient points. I have personally discussed the plan with the patient and/or family.  Very hard to assess volume status. Weight still up from previous but diuresis has slowed and creatinine rising. May also have [place]. Will stop diuretics today and plan RHC tomorrow to more clearly assess. I discussed with him and he agrees.   Glori Bickers, MD  1:52 PM

## 2019-07-18 ENCOUNTER — Encounter (HOSPITAL_COMMUNITY): Payer: Self-pay | Admitting: Internal Medicine

## 2019-07-18 ENCOUNTER — Encounter (HOSPITAL_COMMUNITY)
Admission: EM | Disposition: A | Discharge status: Home / Self Care | Payer: Self-pay | Source: Home / Self Care | Attending: Internal Medicine

## 2019-07-18 HISTORY — PX: RIGHT HEART CATH: CATH118263

## 2019-07-18 LAB — CBC
HCT: 39.8 % (ref 39.0–52.0)
Hemoglobin: 12.8 g/dL — ABNORMAL LOW (ref 13.0–17.0)
MCH: 29.3 pg (ref 26.0–34.0)
MCHC: 32.2 g/dL (ref 30.0–36.0)
MCV: 91.1 fL (ref 80.0–100.0)
Platelets: 227 10*3/uL (ref 150–400)
RBC: 4.37 MIL/uL (ref 4.22–5.81)
RDW: 15.2 % (ref 11.5–15.5)
WBC: 7.7 10*3/uL (ref 4.0–10.5)
nRBC: 0 % (ref 0.0–0.2)

## 2019-07-18 LAB — BASIC METABOLIC PANEL
Anion gap: 13 (ref 5–15)
BUN: 92 mg/dL — ABNORMAL HIGH (ref 8–23)
CO2: 33 mmol/L — ABNORMAL HIGH (ref 22–32)
Calcium: 9.2 mg/dL (ref 8.9–10.3)
Chloride: 86 mmol/L — ABNORMAL LOW (ref 98–111)
Creatinine, Ser: 2.07 mg/dL — ABNORMAL HIGH (ref 0.61–1.24)
GFR calc Af Amer: 35 mL/min — ABNORMAL LOW (ref >60)
GFR calc non Af Amer: 31 mL/min — ABNORMAL LOW (ref >60)
Glucose, Bld: 197 mg/dL — ABNORMAL HIGH (ref 70–99)
Potassium: 3.6 mmol/L (ref 3.5–5.1)
Sodium: 132 mmol/L — ABNORMAL LOW (ref 135–145)

## 2019-07-18 LAB — POCT I-STAT EG7
Acid-Base Excess: 10 mmol/L — ABNORMAL HIGH (ref 0.0–2.0)
Acid-Base Excess: 11 mmol/L — ABNORMAL HIGH (ref 0.0–2.0)
Bicarbonate: 37.9 mmol/L — ABNORMAL HIGH (ref 20.0–28.0)
Bicarbonate: 39.2 mmol/L — ABNORMAL HIGH (ref 20.0–28.0)
Calcium, Ion: 1.09 mmol/L — ABNORMAL LOW (ref 1.15–1.40)
Calcium, Ion: 1.16 mmol/L (ref 1.15–1.40)
HCT: 39 % (ref 39.0–52.0)
HCT: 40 % (ref 39.0–52.0)
Hemoglobin: 13.3 g/dL (ref 13.0–17.0)
Hemoglobin: 13.6 g/dL (ref 13.0–17.0)
O2 Saturation: 70 %
O2 Saturation: 71 %
Potassium: 3.2 mmol/L — ABNORMAL LOW (ref 3.5–5.1)
Potassium: 3.4 mmol/L — ABNORMAL LOW (ref 3.5–5.1)
Sodium: 135 mmol/L (ref 135–145)
Sodium: 137 mmol/L (ref 135–145)
TCO2: 40 mmol/L — ABNORMAL HIGH (ref 22–32)
TCO2: 41 mmol/L — ABNORMAL HIGH (ref 22–32)
pCO2, Ven: 64.4 mmHg — ABNORMAL HIGH (ref 44.0–60.0)
pCO2, Ven: 66.1 mmHg — ABNORMAL HIGH (ref 44.0–60.0)
pH, Ven: 7.377 (ref 7.250–7.430)
pH, Ven: 7.381 (ref 7.250–7.430)
pO2, Ven: 39 mmHg (ref 32.0–45.0)
pO2, Ven: 40 mmHg (ref 32.0–45.0)

## 2019-07-18 LAB — GLUCOSE, CAPILLARY: Glucose-Capillary: 181 mg/dL — ABNORMAL HIGH (ref 70–99)

## 2019-07-18 LAB — CREATININE, SERUM
Creatinine, Ser: 2.09 mg/dL — ABNORMAL HIGH (ref 0.61–1.24)
GFR calc Af Amer: 35 mL/min — ABNORMAL LOW (ref >60)
GFR calc non Af Amer: 30 mL/min — ABNORMAL LOW (ref >60)

## 2019-07-18 SURGERY — RIGHT HEART CATH
Anesthesia: LOCAL

## 2019-07-18 MED ORDER — LIDOCAINE HCL (PF) 1 % IJ SOLN
INTRAMUSCULAR | Status: DC | PRN
  Administered 2019-07-18: 2 mL via INTRADERMAL

## 2019-07-18 MED ORDER — METOLAZONE 2.5 MG PO TABS: 2.5000 mg | ORAL_TABLET | ORAL | 0 refills | Status: DC

## 2019-07-18 MED ORDER — HEPARIN SODIUM (PORCINE) 5000 UNIT/ML IJ SOLN: 5000.0000 [IU] | Freq: Three times a day (TID) | INTRAMUSCULAR | Status: DC

## 2019-07-18 MED ORDER — HEPARIN (PORCINE) IN NACL 1000-0.9 UT/500ML-% IV SOLN
INTRAVENOUS | Status: DC | PRN
  Administered 2019-07-18: 500 mL

## 2019-07-18 MED ORDER — SODIUM CHLORIDE 0.9% FLUSH: 3.0000 mL | Freq: Two times a day (BID) | INTRAVENOUS | Status: DC

## 2019-07-18 MED ORDER — ACETAMINOPHEN 325 MG PO TABS: 650.0000 mg | ORAL_TABLET | ORAL | Status: DC | PRN

## 2019-07-18 MED ORDER — POTASSIUM CHLORIDE CRYS ER 20 MEQ PO TBCR
40.0000 meq | EXTENDED_RELEASE_TABLET | Freq: Once | ORAL | Status: AC
  Administered 2019-07-18: 40 meq via ORAL
  Filled 2019-07-18: qty 2

## 2019-07-18 MED ORDER — LABETALOL HCL 5 MG/ML IV SOLN: 10.0000 mg | INTRAVENOUS | Status: DC | PRN

## 2019-07-18 MED ORDER — SODIUM CHLORIDE 0.9% FLUSH: 3.0000 mL | INTRAVENOUS | Status: DC | PRN

## 2019-07-18 MED ORDER — SODIUM CHLORIDE 0.9 % IV SOLN: 250.0000 mL | INTRAVENOUS | Status: DC | PRN

## 2019-07-18 MED ORDER — HYDRALAZINE HCL 20 MG/ML IJ SOLN: 10.0000 mg | INTRAMUSCULAR | Status: DC | PRN

## 2019-07-18 MED ORDER — LIDOCAINE HCL (PF) 1 % IJ SOLN
INTRAMUSCULAR | Status: AC
  Filled 2019-07-18: qty 30

## 2019-07-18 MED ORDER — ENOXAPARIN SODIUM 40 MG/0.4ML ~~LOC~~ SOLN: 40.0000 mg | SUBCUTANEOUS | Status: DC

## 2019-07-18 MED ORDER — FENTANYL CITRATE (PF) 100 MCG/2ML IJ SOLN
INTRAMUSCULAR | Status: AC
  Filled 2019-07-18: qty 2

## 2019-07-18 MED ORDER — HEPARIN (PORCINE) IN NACL 1000-0.9 UT/500ML-% IV SOLN
INTRAVENOUS | Status: AC
  Filled 2019-07-18: qty 500

## 2019-07-18 MED ORDER — FENTANYL CITRATE (PF) 100 MCG/2ML IJ SOLN
INTRAMUSCULAR | Status: DC | PRN
  Administered 2019-07-18: 25 ug via INTRAVENOUS

## 2019-07-18 MED ORDER — ONDANSETRON HCL 4 MG/2ML IJ SOLN: 4.0000 mg | Freq: Four times a day (QID) | INTRAMUSCULAR | Status: DC | PRN

## 2019-07-18 SURGICAL SUPPLY — 8 items
CATH BALLN WEDGE 5F 110CM (CATHETERS) ×1 IMPLANT
COVER DOME SNAP 22 D (MISCELLANEOUS) ×1 IMPLANT
GUIDEWIRE .025 260CM (WIRE) ×1 IMPLANT
PACK CARDIAC CATHETERIZATION (CUSTOM PROCEDURE TRAY) ×2 IMPLANT
PROTECTION STATION PRESSURIZED (MISCELLANEOUS) ×2
SHEATH GLIDE SLENDER 4/5FR (SHEATH) ×1 IMPLANT
STATION PROTECTION PRESSURIZED (MISCELLANEOUS) IMPLANT
TRANSDUCER W/STOPCOCK (MISCELLANEOUS) ×2 IMPLANT

## 2019-07-18 NOTE — Care Management Important Message (Signed)
Important Message  Patient Details  Name: Travis [name] MRN: [id number] Date of Birth: 01-05-1946   Medicare Important Message Given:  Yes     Shelda Altes 07/18/2019, 2:18 PM

## 2019-07-18 NOTE — Progress Notes (Signed)
Inpatient Diabetes Program Recommendations  AACE/ADA: New Consensus Statement on Inpatient Glycemic Control (2015)  Target Ranges:  Prepandial:   less than 140 mg/dL      Peak postprandial:   less than 180 mg/dL (1-2 hours)      Critically ill patients:  140 - 180 mg/dL   Lab Results  Component Value Date   GLUCAP 212 (H) 07/17/2019   HGBA1C 10.1 (H) 05/04/2019    Review of Glycemic Control  Post-prandial blood sugars elevated. May benefit from adding meal coverage insulin. HgbA1C - 10.1%  Inpatient Diabetes Program Recommendations:     Consider adding Novolog 3-4 units tidwc for meal coverage insulin, if pt eats > 50% meal.  Will speak with pt regarding his HgbA1C of 10.1% and importance of reducing it to < 8%.   Secure text to Dr. Cathlean Sauer.  Will follow closely.  Thank you. Travis [name], RD, LDN, CDE Inpatient Diabetes Coordinator [phone]

## 2019-07-18 NOTE — Progress Notes (Signed)
Progress Note  Patient Name: Travis [name] Date of Encounter: 07/18/2019  Primary Cardiologist: Mertie Moores, MD/D. Aundra Dubin, MD   Subjective   Lasix stopped yesterday as we were unsure of volume status and creatinine rising. Creatinine up 2.01 -> 2.07. Weight down 3 pounds.   Says breathing ok. No orthopnea or PND.   Inpatient Medications    Scheduled Meds: . acetaZOLAMIDE  250 mg Oral BID  . aspirin EC  81 mg Oral Daily  . atorvastatin  20 mg Oral QPM  . enoxaparin (LOVENOX) injection  40 mg Subcutaneous Q24H  . escitalopram  20 mg Oral Daily  . fesoterodine  8 mg Oral Daily  . gabapentin  400 mg Oral TID  . hydrALAZINE  25 mg Oral TID  . insulin aspart  0-9 Units Subcutaneous TID WC  . insulin glargine  30 Units Subcutaneous QHS  . levothyroxine  175 mcg Oral Q0600  . multivitamin with minerals  1 tablet Oral QHS  . pneumococcal 23 valent vaccine  0.5 mL Intramuscular Tomorrow-1000  . polyethylene glycol  17 g Oral QHS  . pramipexole  0.5 mg Oral BID  . sodium chloride flush  3 mL Intravenous Q12H  . spironolactone  25 mg Oral Daily  . tamsulosin  0.4 mg Oral QHS   Continuous Infusions: . sodium chloride    . sodium chloride 10 mL/hr at 07/18/19 0641   PRN Meds: sodium chloride, acetaminophen **OR** acetaminophen, ondansetron **OR** ondansetron (ZOFRAN) IV, pramipexole, sodium chloride flush   Vital Signs    Vitals:   07/17/19 0900 07/17/19 1113 07/17/19 2034 07/18/19 0329  BP:  127/63 111/69 118/70  Pulse: 86  72 63  Resp: (!) 39     Temp:   (!) 97.5 F (36.4 C) 97.6 F (36.4 C)  TempSrc:   Oral Oral  SpO2: 90%  94% 93%  Weight:    129.3 kg  Height:        Intake/Output Summary (Last 24 hours) at 07/18/2019 0728 Last data filed at 07/17/2019 1803 Gross per 24 hour  Intake 1102.4 ml  Output 725 ml  Net 377.4 ml   Filed Weights   07/16/19 0443 07/17/19 0715 07/18/19 0329  Weight: 130.6 kg 130.7 kg 129.3 kg    Physical Exam   General:  Obese.  No resp difficulty HEENT: normal Neck: supple. JVP hard to see  Carotids 2+ bilat; no bruits. No lymphadenopathy or thryomegaly appreciated. Cor: PMI nondisplaced. Regular rate & rhythm. No rubs, gallops or murmurs. Lungs: clear Abdomen: obese soft, nontender, nondistended. No hepatosplenomegaly. No bruits or masses. Good bowel sounds. Extremities: no cyanosis, clubbing, rash, 1+ edema +UNNA boots Neuro: alert & orientedx3, cranial nerves grossly intact. moves all 4 extremities w/o difficulty. Affect pleasant  Labs    Chemistry Recent Labs  Lab 07/13/19 1845 07/14/19 0543  07/16/19 0334 07/17/19 0748 07/18/19 0403  NA 137 139   < > 136 135 132*  K 4.4 4.0   < > 3.7 4.0 3.6  CL 92* 93*   < > 85* 85* 86*  CO2 30 33*   < > 36* 35* 33*  GLUCOSE 287* 181*   < > 215* 225* 197*  BUN 63* 63*   < > 75* 85* 92*  CREATININE 2.00* 1.83*   < > 1.97* 2.01* 2.07*  CALCIUM 9.2 9.3   < > 9.5 9.8 9.2  PROT 8.2* 7.2  --   --   --   --   ALBUMIN  3.7 3.3*  --   --   --   --   AST 19 16  --   --   --   --   ALT 16 14  --   --   --   --   ALKPHOS 104 85  --   --   --   --   BILITOT 0.5 0.6  --   --   --   --   GFRNONAA 32* 36*   < > 33* 32* 31*  GFRAA 37* 41*   < > 38* 37* 35*  ANIONGAP 15 13   < > 15 15 13    < > = values in this interval not displayed.     Hematology Recent Labs  Lab 07/13/19 1845 07/14/19 0543  WBC 7.9 6.2  RBC 4.48 4.20*  HGB 13.1 12.1*  HCT 42.2 39.6  MCV 94.2 94.3  MCH 29.2 28.8  MCHC 31.0 30.6  RDW 15.9* 15.9*  PLT 242 220   BNP Recent Labs  Lab 07/13/19 1845  BNP 45.7     Radiology    [place] Ekg Site Rite  Result Date: 07/17/2019 If Site Rite image not attached, placement could not be confirmed due to current cardiac rhythm.   Telemetry    RSR, rare PVCs - Personally Reviewed  Cardiac Studies   2D Echocardiogram 5.3.2020  1. The left ventricle has normal systolic function, with an ejection fraction of 60-65%. Left ventricular diastolic Doppler  parameters are consistent with indeterminate diastolic dysfunction. No evidence of left ventricular regional wall motion  abnormalities. 2. The right ventricle has normal systolc function. The cavity was normal. There is no increase in right ventricular wall thickness. Right ventricular systolic pressure could not be assessed. 3. Left atrial size was not well visualized. 4. The mitral valve is grossly normal. 5. The aortic valve is tricuspid. 6. The aortic root is normal in size and structure. 7. The inferior vena cava was dilated in size with <50% respiratory variability. 8. The interatrial septum was not well visualized.  Patient Profile     73 y.o. male w/ a h/o HFpEF, COPD, CKD III, nonobs CAD, OSA, and DMII, who was admitted 07/13/2019 w/ progressive weight gain, dyspnea, and swelling.  Assessment & Plan    1.  Acute on chronic diastolic CHF:   - Nl LV fxn by echo in 04/2019.  PYP scan 10/2018 w/ Grade 1, H/CLL 1.45 - determined to be negative.   - Myeloma panel w/ no M-spike, nl IFE>> per chart review.   -  Admit wt 135kg.  Dry wt closer to 125 kg.  He is 129.3kg today.  - lasix stopped 7/19 with rising creatinine -Very hard to assess volume status. Weight still up from previous but diuresis has slowed and creatinine rising. May also have [place]. - Now off diuretics. Plan RHC today to more clearly assess. I discussed with him and he agrees.   2.  Chronic hypoxic resp failure/COPD:  Moderately hypoxic on presentation req 6l [place].  Has since been stable and now on 2 lpm.  Uses 2lpm @ home @ night only.  3.  CKD III w/ AKI in the setting of diuresis:  As above, BUN/Creat higher this AM @ 92/2.07.  Bicarb stable.  See #1. - baseline creatinine 1.9-2.0  4.  Nonobs CAD:  Cath 08/2015 w/ nonobs multivessel dzs.  No c/p.  Cont asa/statin. No  blocker 2/2 COPD.  5.  OSA:  Using BiPAP HS.  6.  DMII:  A1c 10.1 in May.  Prev intol to jardiance 2/2 UTI/rash. - TR[facility] managing  Signed,  Glori Bickers, MD  07/18/2019, 7:28 AM    For questions or updates, please contact   Please consult [email or web address] for contact info under Cardiology/STEMI.

## 2019-07-18 NOTE — Progress Notes (Signed)
Physical Therapy Treatment Patient Details Name: Travis [name] MRN: [id number] DOB: 1946/08/15 Today's Date: 07/18/2019    History of Present Illness 73 y.o. male with history of diastolic CHF last EF measured in May 2020 was 42 to 65% with history of diabetes mellitus type 2, sleep apnea, COPD, chronic kidney disease has been experiencing increasing shortness of breath and lower extremity edema. Pt hypoxic in ED requiring 6L O2. Admitted 07/13/19 for treatment of acute on chronic diastolic CHF.      PT Comments    Pt sitting EoB eager to d/c home. Pt requires maxAx2 for power up to [facility] for donning of LE dressing, and power up from [place] to sit in w/c. Pt transfer to family vehicle and requires maxAx2 for power up from w/c to standing with heavy pt pulling on vehicle door. Pt requires total A for management of LE into vehicle. PT continues to feel pt would benefit from a short bout of rehab at SNF level facility, but is going home to resume HHPT.     Follow Up Recommendations  Home health PT;Supervision - Intermittent     Equipment Recommendations  None recommended by PT       Precautions / Restrictions Precautions Precautions: Fall Restrictions Weight Bearing Restrictions: No    Mobility  Bed Mobility               General bed mobility comments: seated EoB ready for discharge  Transfers Overall transfer level: Needs assistance Equipment used: None Transfers: Sit to/from Stand Sit to Stand: +2 physical assistance;Max assist;From elevated surface         General transfer comment: pt requires maxAx2 for power up to [facility], pt able to statically stand for therapist to pull up brief and shorts before sitting back on [place] pads. Pt transferred to w/c for discharge. Pt wheeled to family car and requires maxAx2 for power up to standing with pt pulling on doorframe. PT requires total A to pivot on bottom and lift LE into car        Balance Overall balance assessment:  Needs assistance Sitting-balance support: No upper extremity supported;Feet supported Sitting balance-Leahy Scale: Fair     Standing balance support: Bilateral upper extremity supported Standing balance-Leahy Scale: Poor Standing balance comment: pt requires UE support from [facility] but able to statically stand without UE support for 10 sec before having to reach back to [employer] Arousal/Alertness: Awake/alert Behavior During Therapy: [facility] for tasks assessed/performed Overall Cognitive Status: Within Functional Limits for tasks assessed                                               Pertinent Vitals/Pain Pain Assessment: No/denies pain           PT Goals (current goals can now be found in the care plan section) Acute Rehab PT Goals Patient Stated Goal: go home PT Goal Formulation: With patient Time For Goal Achievement: 07/30/19 Potential to Achieve Goals: Fair    Frequency    Min 3X/week      PT Plan Current plan remains appropriate       AM-PAC PT "6 Clicks" Mobility   Outcome Measure  Help needed turning from your back  to your side while in a flat bed without using bedrails?: A Little Help needed moving from lying on your back to sitting on the side of a flat bed without using bedrails?: A Little Help needed moving to and from a bed to a chair (including a wheelchair)?: Total Help needed standing up from a chair using your arms (e.g., wheelchair or bedside chair)?: Total Help needed to walk in hospital room?: Total Help needed climbing 3-5 steps with a railing? : Total 6 Click Score: 10    End of Session Equipment Utilized During Treatment: Gait belt;Oxygen Activity Tolerance: Patient tolerated treatment well Patient left: in chair;with call bell/phone within reach Nurse Communication: Mobility status PT Visit Diagnosis: Unsteadiness on feet (R26.81);Other abnormalities of gait and mobility  (R26.89);Muscle weakness (generalized) (M62.81);Difficulty in walking, not elsewhere classified (R26.2)     Time: 0352-4818 PT Time Calculation (min) (ACUTE ONLY): 36 min  Charges:  $Therapeutic Activity: 23-37 mins                     Kilan Banfill B. Migdalia Dk PT, DPT Acute Rehabilitation Services Pager [phone] Office [phone]    [place] 07/18/2019, 1:19 PM

## 2019-07-18 NOTE — Discharge Summary (Signed)
Physician Discharge Summary  Travis [name] ERD:[id number] DOB: 11-Oct-1946 DOA: 07/13/2019  PCP: [name], MD  Admit date: 07/13/2019 Discharge date: 07/18/2019  Admitted From: Home  Disposition:  Home   Recommendations for Outpatient Follow-up and new medication changes:  1. Follow up with Dr. Marisue Humble in 7 days.  2. Follow with heart failure clinic as scheduled.  3. Metolazone increased from weekly to twice weekly, Monday and Friday.  Home Health: no   Equipment/Devices: no    Discharge Condition: stable  CODE STATUS: full  Diet recommendation: no family at the bedside  Brief/Interim Summary: 73 year old male who presented with dyspnea and worsening lower extremity edema. He does have significant past medical history for diastolic heart failure, type 2 diabetes mellitus, COPD, chronic kidney disease and sleep apnea. Patient reported 2-week history of worsening dyspnea and lower extremity edema despite increase dose of metolazone at home. On his initial physical examination he was hypoxic, requiring 6 L per nasal cannula, blood pressure 115/61, heart rate 84, respiratory rate 35, oxygen saturation 98%, his lungs had no rhonchi or rales, heart iS1-S2  present and rhythmic, abdomen soft, positive lower extremities edema with compression stockings bilaterally.Sodium 137 potassium 4.4, chloride 91 bicarb 30, glucose 287, BUN 63, creatinine 2.0, white count 7.9, hemoglobin 13.9, hematocrit 42.2, platelets 242. SARS covid 19 negative.Chest film hypoinflated, left base atelectasis. EKG 96 bpm, normal axis, right bundle branch block, no ST segment changes, no T wave changes.  Patient was admitted to the hospitalwith theworking diagnosis of decompensated diastolic (acute on chronic) heart failure.  Patient has been responding well to aggressive diuresis with IV furosemide.   1. Acute on chronic decompensated diastolic heart failure/ EF 60 to 65%. Patient was admitted to the  progressive care unit, he received aggressive diuresis with furosemide continuous drip, along withspironolactone, metolazone and acetazolamide.  He achieved a negative fluid balance(-4,744 ML) since admission with significant improvement of his symptoms.  Patient will continue heart failure management with afterload reduction with hydralazine, diuresis with torsemide 80 mg twice daily, spironolactone and twice weekly metolazone.  Follow-up with heart failure clinic as an outpatient.  After diuresis patient underwent right heart catheterization:  RA = 8 RV = 42/9 PA = 44/11 (26) Mild pulmonary hypertension.  PCW = 13 Fick cardiac output/index = 8.8/3.5 PVR = 1.5 WU Ao sat = 93% PA sat = 79%, 71%  2. CKD stage 3 with contraction alkalosis. His kidney function remained stable during his hospitalizationserum creatinine 1.8-2.0.  Potassium 3.6 and serum bicarbonate 33.  Close follow-up of kidney function as an outpatient.  Note that patient is on spironolactone and potassium supplements.   3. COPD.No clinical signs of acuteexacerbation, during this hospitalization..   4. T2DM. During his hospitalization his dose of insulin was reduced to avoid hypoglycemia, his fasting glucose is 197 on the day of discharge.  At home he will resume his usual regimen of insulin, (glargine 60 units qhs plus insulin sliding scale).  5. Obesity and dyslipidemia.BMI is 38, Continue atorvastatin.   6. Hypothyroid.Continue with levothyroxine.   Discharge Diagnoses:  Principal Problem:   Acute on chronic diastolic CHF (congestive heart failure) (HCC) Active Problems:   OSA on CPAP   Restless legs syndrome   Hypothyroid   Stage 3 chronic kidney disease (HCC)   Type 2 diabetes mellitus with stage 3 chronic kidney disease, with long-term current use of insulin (HCC)   COPD (chronic obstructive pulmonary disease) (HCC)   Acute CHF (congestive heart failure) ([place])  Discharge  Instructions  Discharge Instructions    Diet - low sodium heart healthy   Complete by: As directed    Discharge instructions   Complete by: As directed    Please follow with primary care in 7 days.   Increase activity slowly   Complete by: As directed      Allergies as of 07/18/2019      Reactions   Amlodipine Swelling, Other (See Comments)   Causes edema (per [facility] paperwork)   Plendil [felodipine] Swelling, Other (See Comments)   Causes edema (per [facility] paperwork)   Compazine [prochlorperazine] Other (See Comments)   Caused agitation (per [facility] paperwork)   Cymbalta [duloxetine Hcl] Other (See Comments)   Made the patient "feel weird," per [facility] paperwork   Jardiance [empagliflozin] Other (See Comments)   Stopped by MD because of unwanted side effects   Other Nausea Only, Other (See Comments)   ANESTHESIA UNSPECIFIED- makes sick, must have anti-nausea medication in advance   Levofloxacin In D5w Itching   Possible infusion reaction 03/29/17. Tolerating oral Levaquin 03/30/17   Septra [sulfamethoxazole-trimethoprim] Nausea Only, Other (See Comments)   Per [facility] paperwork      Medication List    STOP taking these medications   ketoconazole 2 % cream Commonly known as: NIZORAL     TAKE these medications   aspirin EC 81 MG tablet Take 81 mg by mouth daily.   atorvastatin 20 MG tablet Commonly known as: LIPITOR Take 20 mg by mouth every evening.   escitalopram 20 MG tablet Commonly known as: LEXAPRO Take 20 mg by mouth daily.   FreeStyle Libre 14 Day Reader Kerrin Mo 1 kit by Does not apply route 3 (three) times daily.   FreeStyle Libre 14 Day Sensor Misc 1 kit by Does not apply route 3 (three) times daily.   gabapentin 400 MG capsule Commonly known as: NEURONTIN Take 400 mg by mouth 3 (three) times daily.   glucose blood test strip Use as instructed to test blood sugars 4 times daily DX E11.42   hydrALAZINE 25  MG tablet Commonly known as: APRESOLINE Take 25 mg by mouth 3 (three) times daily.   insulin aspart 100 UNIT/ML FlexPen Commonly known as: NovoLOG FlexPen Inject 18 Units into the skin 3 (three) times daily with meals.   Insulin Glargine 100 UNIT/ML Solostar Pen Commonly known as: Lantus SoloStar Inject 60 Units into the skin at bedtime.   Insulin Pen Needle 32G X 6 MM Misc Commonly known as: BD Pen Needle Micro U/F Four times daily   Insulin Pen Needle 32G X 6 MM Misc Commonly known as: BD Pen Needle Micro U/F 4 times a day   levothyroxine 175 MCG tablet Commonly known as: SYNTHROID Take 1 tablet (175 mcg total) by mouth daily.   metolazone 2.5 MG tablet Commonly known as: ZAROXOLYN Take 1 tablet (2.5 mg total) by mouth 2 (two) times a week. Monday AND FRIDAY What changed:   when to take this  additional instructions   multivitamin Tabs tablet Take 1 tablet by mouth at bedtime.   onetouch ultrasoft lancets Use as instructed to test blood sugars 4 times daily DX E11.42   OneTouch Verio w/Device Kit 1 kit by Does not apply route 4 (four) times daily. Use as directed to test blood sugars 4 times daily DX E11.42   OXYGEN Inhale 2-3 Units into the lungs at bedtime.   polyethylene glycol 17 g packet Commonly known as: MIRALAX / GLYCOLAX  Take 17 g by mouth daily as needed. For constipation What changed:   when to take this  additional instructions   potassium chloride SA 20 MEQ tablet Commonly known as: K-DUR Take 1 tablet (20 mEq total) by mouth 2 (two) times daily. Take 1 tab only on days you take Metolazone   pramipexole 0.5 MG tablet Commonly known as: MIRAPEX One tablet in the morning and 2 in the evening What changed:   how much to take  how to take this  when to take this  additional instructions   spironolactone 25 MG tablet Commonly known as: ALDACTONE Take 1 tablet (25 mg total) by mouth daily.   tamsulosin 0.4 MG Caps capsule Commonly  known as: FLOMAX Take 1 capsule (0.4 mg total) by mouth daily. What changed: when to take this   torsemide 20 MG tablet Commonly known as: DEMADEX Take 4 tablets (80 mg total) by mouth 2 (two) times daily.   Toviaz 8 MG Tb24 tablet Generic drug: fesoterodine Take 1 tablet (8 mg total) by mouth daily.       Allergies  Allergen Reactions  . Amlodipine Swelling and Other (See Comments)    Causes edema (per [facility] paperwork)  . Plendil [Felodipine] Swelling and Other (See Comments)    Causes edema (per [facility] paperwork)  . Compazine [Prochlorperazine] Other (See Comments)    Caused agitation (per [facility] paperwork)  . Cymbalta [Duloxetine Hcl] Other (See Comments)    Made the patient "feel weird," per [facility] paperwork  . Jardiance [Empagliflozin] Other (See Comments)    Stopped by MD because of unwanted side effects  . Other Nausea Only and Other (See Comments)    ANESTHESIA UNSPECIFIED- makes sick, must have anti-nausea medication in advance  . Levofloxacin In D5w Itching    Possible infusion reaction 03/29/17. Tolerating oral Levaquin 03/30/17  . Septra [Sulfamethoxazole-Trimethoprim] Nausea Only and Other (See Comments)    Per [facility] paperwork    Consultations:  Cardiology heart failure    Procedures/Studies: Dg Chest Port 1 View  Result Date: 07/13/2019 CLINICAL DATA:  73 y/o M; 2 days of shortness of breath. Patient denies chest pain. Former smoker. EXAM: PORTABLE CHEST 1 VIEW COMPARISON:  05/01/2019 chest radiograph. FINDINGS: Stable cardiac silhouette partially obscured by elevated hemidiaphragms. Aortic atherosclerosis with calcification. Low lung volumes accentuate pulmonary markings. Platelike atelectasis in the lung bases. No visible effusion. No pneumothorax. No acute osseous abnormality is evident. Midthoracic fusion hardware noted. IMPRESSION: Low lung volumes. Platelike atelectasis in the lung bases. Aortic  atherosclerosis. Electronically Signed   By: Kristine Garbe M.D.   On: 07/13/2019 19:34   [place] Ekg Site Rite  Result Date: 07/17/2019 If Site Rite image not attached, placement could not be confirmed due to current cardiac rhythm.     Procedures: right heart catheterization   Subjective: Patient is feeling better, very close to his baseline, no nausea or vomiting, no chest pain and continue to improve dyspnea an lower extremity edema.   Discharge Exam: Vitals:   07/18/19 0823 07/18/19 0828  BP: (!) 146/84   Pulse: 67 (!) 0  Resp: (!) 24 (!) 6  Temp:    SpO2: 94% (!) 0%   Vitals:   07/18/19 0813 07/18/19 0818 07/18/19 0823 07/18/19 0828  BP: (!) 144/79 (!) 144/81 (!) 146/84   Pulse: 67 68 67 (!) 0  Resp: 11 (!) 34 (!) 24 (!) 6  Temp:      TempSrc:  SpO2: 93% 94% 94% (!) 0%  Weight:      Height:        General: not in pain or dyspnea  Neurology: Awake and alert, non focal  E ENT: mild pallor, no icterus, oral mucosa moist Cardiovascular: No JVD. S1-S2 present, rhythmic, no gallops, rubs, or murmurs. +/++  lower extremity edema/ compression unna boots in place. Pulmonary: positive breath sounds bilaterally, adequate air movement, no wheezing, rhonchi or rales. Gastrointestinal. Abdomen with no organomegaly, non tender, no rebound or guarding Skin. No rashes Musculoskeletal: no joint deformities   The results of significant diagnostics from this hospitalization (including imaging, microbiology, ancillary and laboratory) are listed below for reference.     Microbiology: Recent Results (from the past 240 hour(s))  SARS Coronavirus 2 (CEPHEID - Performed in [place] hospital lab), Hosp Order     Status: None   Collection Time: 07/13/19  9:00 PM   Specimen: Nasopharyngeal Swab  Result Value Ref Range Status   SARS Coronavirus 2 NEGATIVE NEGATIVE Final    Comment: (NOTE) If result is NEGATIVE SARS-CoV-2 target nucleic acids are NOT DETECTED. The  SARS-CoV-2 RNA is generally detectable in upper and lower  respiratory specimens during the acute phase of infection. The lowest  concentration of SARS-CoV-2 viral copies this assay can detect is 250  copies / mL. A negative result does not preclude SARS-CoV-2 infection  and should not be used as the sole basis for treatment or other  patient management decisions.  A negative result may occur with  improper specimen collection / handling, submission of specimen other  than nasopharyngeal swab, presence of viral mutation(s) within the  areas targeted by this assay, and inadequate number of viral copies  (<250 copies / mL). A negative result must be combined with clinical  observations, patient history, and epidemiological information. If result is POSITIVE SARS-CoV-2 target nucleic acids are DETECTED. The SARS-CoV-2 RNA is generally detectable in upper and lower  respiratory specimens dur ing the acute phase of infection.  Positive  results are indicative of active infection with SARS-CoV-2.  Clinical  correlation with patient history and other diagnostic information is  necessary to determine patient infection status.  Positive results do  not rule out bacterial infection or co-infection with other viruses. If result is PRESUMPTIVE POSTIVE SARS-CoV-2 nucleic acids MAY BE PRESENT.   A presumptive positive result was obtained on the submitted specimen  and confirmed on repeat testing.  While 2019 novel coronavirus  (SARS-CoV-2) nucleic acids may be present in the submitted sample  additional confirmatory testing may be necessary for epidemiological  and / or clinical management purposes  to differentiate between  SARS-CoV-2 and other Sarbecovirus currently known to infect humans.  If clinically indicated additional testing with an alternate test  methodology [phone]) is advised. The SARS-CoV-2 RNA is generally  detectable in upper and lower respiratory sp ecimens during the acute   phase of infection. The expected result is Negative. Fact Sheet for Patients:  StrictlyIdeas.no Fact Sheet for Healthcare Providers: BankingDealers.co.za This test is not yet approved or cleared by the [place] FDA and has been authorized for detection and/or diagnosis of SARS-CoV-2 by FDA under an Emergency Use Authorization (EUA).  This EUA will remain in effect (meaning this test can be used) for the duration of the COVID-19 declaration under Section 564(b)(1) of the Act, 21 U.S.C. section 360bbb-3(b)(1), unless the authorization is terminated or revoked sooner. Performed at [facility], [place] [address] [place], [place]  [address]      Labs: BNP (last 3 results) Recent Labs    03/02/19 1157 05/01/19 0123 07/13/19 1845  BNP 44.4 87.6 09.3   Basic Metabolic Panel: Recent Labs  Lab 07/14/19 0543 07/15/19 1045 07/16/19 0334 07/17/19 0748 07/18/19 0403 07/18/19 0854  NA 139 135 136 135 132*  --   K 4.0 4.0 3.7 4.0 3.6  --   CL 93* 86* 85* 85* 86*  --   CO2 33* 36* 36* 35* 33*  --   GLUCOSE 181* 239* 215* 225* 197*  --   BUN 63* 66* 75* 85* 92*  --   CREATININE 1.83* 1.84* 1.97* 2.01* 2.07* 2.09*  CALCIUM 9.3 9.6 9.5 9.8 9.2  --   MG 2.5*  --  2.6*  --   --   --    Liver Function Tests: Recent Labs  Lab 07/13/19 1845 07/14/19 0543  AST 19 16  ALT 16 14  ALKPHOS 104 85  BILITOT 0.5 0.6  PROT 8.2* 7.2  ALBUMIN 3.7 3.3*   No results for input(s): LIPASE, AMYLASE in the last 168 hours. No results for input(s): AMMONIA in the last 168 hours. CBC: Recent Labs  Lab 07/13/19 1845 07/14/19 0543 07/18/19 0854  WBC 7.9 6.2 7.7  NEUTROABS 6.3 4.5  --   HGB 13.1 12.1* 12.8*  HCT 42.2 39.6 39.8  MCV 94.2 94.3 91.1  PLT 242 220 227   Cardiac Enzymes: No results for input(s): CKTOTAL, CKMB, CKMBINDEX, TROPONINI in the last 168 hours. BNP: Invalid input(s): POCBNP CBG: Recent Labs  Lab 07/17/19 0728  07/17/19 1130 07/17/19 1638 07/17/19 2105 07/18/19 0916  GLUCAP 202* 323* 227* 212* 181*   D-Dimer No results for input(s): DDIMER in the last 72 hours. Hgb A1c No results for input(s): HGBA1C in the last 72 hours. Lipid Profile No results for input(s): CHOL, HDL, LDLCALC, TRIG, CHOLHDL, LDLDIRECT in the last 72 hours. Thyroid function studies No results for input(s): TSH, T4TOTAL, T3FREE, THYROIDAB in the last 72 hours.  Invalid input(s): FREET3 Anemia work up No results for input(s): VITAMINB12, FOLATE, FERRITIN, TIBC, IRON, RETICCTPCT in the last 72 hours. Urinalysis    Component Value Date/Time   COLORURINE STRAW (A) 05/01/2019 0258   APPEARANCEUR CLEAR 05/01/2019 0258   LABSPEC 1.009 05/01/2019 0258   PHURINE 5.0 05/01/2019 0258   GLUCOSEU 150 (A) 05/01/2019 0258   HGBUR NEGATIVE 05/01/2019 0258   BILIRUBINUR NEGATIVE 05/01/2019 0258   KETONESUR NEGATIVE 05/01/2019 0258   PROTEINUR NEGATIVE 05/01/2019 0258   UROBILINOGEN 0.2 08/22/2015 1820   NITRITE NEGATIVE 05/01/2019 0258   LEUKOCYTESUR MODERATE (A) 05/01/2019 0258   Sepsis Labs Invalid input(s): PROCALCITONIN,  WBC,  LACTICIDVEN Microbiology Recent Results (from the past 240 hour(s))  SARS Coronavirus 2 (CEPHEID - Performed in [place] hospital lab), Hosp Order     Status: None   Collection Time: 07/13/19  9:00 PM   Specimen: Nasopharyngeal Swab  Result Value Ref Range Status   SARS Coronavirus 2 NEGATIVE NEGATIVE Final    Comment: (NOTE) If result is NEGATIVE SARS-CoV-2 target nucleic acids are NOT DETECTED. The SARS-CoV-2 RNA is generally detectable in upper and lower  respiratory specimens during the acute phase of infection. The lowest  concentration of SARS-CoV-2 viral copies this assay can detect is 250  copies / mL. A negative result does not preclude SARS-CoV-2 infection  and should not be used as the sole basis for treatment or other  patient management decisions.  A negative result  may occur  with  improper specimen collection / handling, submission of specimen other  than nasopharyngeal swab, presence of viral mutation(s) within the  areas targeted by this assay, and inadequate number of viral copies  (<250 copies / mL). A negative result must be combined with clinical  observations, patient history, and epidemiological information. If result is POSITIVE SARS-CoV-2 target nucleic acids are DETECTED. The SARS-CoV-2 RNA is generally detectable in upper and lower  respiratory specimens dur ing the acute phase of infection.  Positive  results are indicative of active infection with SARS-CoV-2.  Clinical  correlation with patient history and other diagnostic information is  necessary to determine patient infection status.  Positive results do  not rule out bacterial infection or co-infection with other viruses. If result is PRESUMPTIVE POSTIVE SARS-CoV-2 nucleic acids MAY BE PRESENT.   A presumptive positive result was obtained on the submitted specimen  and confirmed on repeat testing.  While 2019 novel coronavirus  (SARS-CoV-2) nucleic acids may be present in the submitted sample  additional confirmatory testing may be necessary for epidemiological  and / or clinical management purposes  to differentiate between  SARS-CoV-2 and other Sarbecovirus currently known to infect humans.  If clinically indicated additional testing with an alternate test  methodology [phone]) is advised. The SARS-CoV-2 RNA is generally  detectable in upper and lower respiratory sp ecimens during the acute  phase of infection. The expected result is Negative. Fact Sheet for Patients:  StrictlyIdeas.no Fact Sheet for Healthcare Providers: BankingDealers.co.za This test is not yet approved or cleared by the [place] FDA and has been authorized for detection and/or diagnosis of SARS-CoV-2 by FDA under an Emergency Use Authorization (EUA).  This EUA  will remain in effect (meaning this test can be used) for the duration of the COVID-19 declaration under Section 564(b)(1) of the Act, 21 U.S.C. section 360bbb-3(b)(1), unless the authorization is terminated or revoked sooner. Performed at [facility], [place] [address], [place], [place] [address]      Time coordinating discharge: 45 minutes  SIGNED:   Tawni Millers, MD  [facility] 07/18/2019, 10:50 AM

## 2019-07-18 NOTE — TOC Transition Note (Signed)
Transition of Care [facility]) - CM/SW Discharge Note Marvetta Gibbons RN,BSN Transitions of Care Cross Coverage 6E - RN Case Manager [phone]   Patient Details  Name: Travis [name] MRN: [id number] Date of Birth: [birth year]  Transition of Care [facility]) CM/SW Contact:  Dawayne Patricia, RN Phone Number: 07/18/2019, 12:59 PM   Clinical Narrative:    Pt stable for transition home, orders for HHRN/PT, call made to wife regarding transition needs- per wife pt is active with [facility] and also [facility] (community based CM which is a benefit of his HTA insurance)- wife voices that they want to continue with [facility] for skilled needs. Pt has DME at home. Call made to [place] with [facility] for HHRN/PT - referral accepted for continued [facility] needs.    Final next level of care: [place] Barriers to Discharge: No Barriers Identified   Patient Goals and CMS Choice Patient states their goals for this hospitalization and ongoing recovery are:: going home CMS Medicare.gov Compare Post Acute Care list provided to:: Patient Represenative (must comment)(wife) Choice offered to / list presented to : Spouse  Discharge Placement  Home with [facility]                     Discharge Plan and Services   Discharge Planning Services: CM Consult Post Acute Care Choice: [place], Resumption of Svcs/PTA Provider          DME Arranged: [name] DME Agency: NA       HH Arranged: RN, PT HH Agency: [facility] (formerly [employer]) Date [place]: 07/18/19 Time [place]: 1040 Representative spoke with at [place]: [place] ([place]) Interventions     Readmission Risk Interventions Readmission Risk Prevention Plan 07/18/2019  Transportation Screening Complete  PCP or Specialist Appt within 3-5 Days Complete  HRI or [place] Complete  Social Work Consult for [place] Planning/Counseling Complete  Palliative Care  Screening Not Applicable  Medication Review Press photographer) Complete  Some recent data might be hidden

## 2019-07-18 NOTE — Progress Notes (Signed)
  R[facility] Findings:  RA = 8 RV = 42/9 PA = 44/11 (26) PCW = 13 Fick cardiac output/index = 8.8/3.5 PVR = 1.5 WU Ao sat = 93% PA sat = 79%, 71%  Assessment: 1. Mild PAH 2. Well compensated filling pressures and output  Plan/Discussion:  Can go home today on previous home diuretic regimen but would increase metoalzone to twice a week (Monday and Friday). Dry weight now 285-287.   Glori Bickers, MD  8:22 AM

## 2019-07-20 ENCOUNTER — Emergency Department (HOSPITAL_COMMUNITY): Payer: PPO

## 2019-07-20 ENCOUNTER — Observation Stay (HOSPITAL_COMMUNITY)
Admission: EM | Admit: 2019-07-20 | Discharge: 2019-07-21 | Disposition: A | Discharge status: Home / Self Care | Payer: PPO | Attending: Internal Medicine | Admitting: Internal Medicine

## 2019-07-20 ENCOUNTER — Telehealth (HOSPITAL_COMMUNITY): Payer: Self-pay

## 2019-07-20 ENCOUNTER — Observation Stay (HOSPITAL_COMMUNITY): Payer: PPO

## 2019-07-20 ENCOUNTER — Other Ambulatory Visit: Payer: Self-pay

## 2019-07-20 DIAGNOSIS — R410 Disorientation, unspecified: Secondary | ICD-10-CM

## 2019-07-20 DIAGNOSIS — I5030 Unspecified diastolic (congestive) heart failure: Secondary | ICD-10-CM | POA: Insufficient documentation

## 2019-07-20 DIAGNOSIS — Z794 Long term (current) use of insulin: Secondary | ICD-10-CM | POA: Insufficient documentation

## 2019-07-20 DIAGNOSIS — I10 Essential (primary) hypertension: Secondary | ICD-10-CM | POA: Diagnosis not present

## 2019-07-20 DIAGNOSIS — J9622 Acute and chronic respiratory failure with hypercapnia: Secondary | ICD-10-CM | POA: Diagnosis not present

## 2019-07-20 DIAGNOSIS — I130 Hypertensive heart and chronic kidney disease with heart failure and stage 1 through stage 4 chronic kidney disease, or unspecified chronic kidney disease: Secondary | ICD-10-CM | POA: Diagnosis not present

## 2019-07-20 DIAGNOSIS — G4733 Obstructive sleep apnea (adult) (pediatric): Secondary | ICD-10-CM | POA: Diagnosis not present

## 2019-07-20 DIAGNOSIS — E1165 Type 2 diabetes mellitus with hyperglycemia: Secondary | ICD-10-CM

## 2019-07-20 DIAGNOSIS — R0602 Shortness of breath: Secondary | ICD-10-CM | POA: Diagnosis not present

## 2019-07-20 DIAGNOSIS — I5033 Acute on chronic diastolic (congestive) heart failure: Secondary | ICD-10-CM | POA: Diagnosis not present

## 2019-07-20 DIAGNOSIS — J438 Other emphysema: Secondary | ICD-10-CM | POA: Diagnosis not present

## 2019-07-20 DIAGNOSIS — R000 Tachycardia, unspecified: Secondary | ICD-10-CM | POA: Diagnosis not present

## 2019-07-20 DIAGNOSIS — J449 Chronic obstructive pulmonary disease, unspecified: Secondary | ICD-10-CM | POA: Diagnosis not present

## 2019-07-20 DIAGNOSIS — G9341 Metabolic encephalopathy: Secondary | ICD-10-CM | POA: Diagnosis not present

## 2019-07-20 DIAGNOSIS — E1122 Type 2 diabetes mellitus with diabetic chronic kidney disease: Secondary | ICD-10-CM | POA: Diagnosis not present

## 2019-07-20 DIAGNOSIS — R5381 Other malaise: Secondary | ICD-10-CM | POA: Diagnosis not present

## 2019-07-20 DIAGNOSIS — G9340 Encephalopathy, unspecified: Secondary | ICD-10-CM | POA: Diagnosis not present

## 2019-07-20 DIAGNOSIS — Z79899 Other long term (current) drug therapy: Secondary | ICD-10-CM | POA: Insufficient documentation

## 2019-07-20 DIAGNOSIS — G2581 Restless legs syndrome: Secondary | ICD-10-CM | POA: Insufficient documentation

## 2019-07-20 DIAGNOSIS — Z20828 Contact with and (suspected) exposure to other viral communicable diseases: Secondary | ICD-10-CM | POA: Insufficient documentation

## 2019-07-20 DIAGNOSIS — J9621 Acute and chronic respiratory failure with hypoxia: Secondary | ICD-10-CM | POA: Insufficient documentation

## 2019-07-20 DIAGNOSIS — Z7982 Long term (current) use of aspirin: Secondary | ICD-10-CM | POA: Insufficient documentation

## 2019-07-20 DIAGNOSIS — M4800 Spinal stenosis, site unspecified: Secondary | ICD-10-CM | POA: Diagnosis not present

## 2019-07-20 DIAGNOSIS — N183 Chronic kidney disease, stage 3 unspecified: Secondary | ICD-10-CM | POA: Diagnosis present

## 2019-07-20 DIAGNOSIS — E039 Hypothyroidism, unspecified: Secondary | ICD-10-CM | POA: Diagnosis present

## 2019-07-20 DIAGNOSIS — R531 Weakness: Secondary | ICD-10-CM

## 2019-07-20 DIAGNOSIS — R0902 Hypoxemia: Secondary | ICD-10-CM | POA: Diagnosis not present

## 2019-07-20 DIAGNOSIS — G4719 Other hypersomnia: Secondary | ICD-10-CM

## 2019-07-20 DIAGNOSIS — Z9049 Acquired absence of other specified parts of digestive tract: Secondary | ICD-10-CM | POA: Diagnosis not present

## 2019-07-20 DIAGNOSIS — J984 Other disorders of lung: Secondary | ICD-10-CM | POA: Diagnosis not present

## 2019-07-20 DIAGNOSIS — Z87891 Personal history of nicotine dependence: Secondary | ICD-10-CM | POA: Diagnosis not present

## 2019-07-20 DIAGNOSIS — Z9989 Dependence on other enabling machines and devices: Secondary | ICD-10-CM

## 2019-07-20 DIAGNOSIS — K5900 Constipation, unspecified: Secondary | ICD-10-CM | POA: Insufficient documentation

## 2019-07-20 LAB — BASIC METABOLIC PANEL
Anion gap: 14 (ref 5–15)
BUN: 96 mg/dL — ABNORMAL HIGH (ref 8–23)
CO2: 28 mmol/L (ref 22–32)
Calcium: 8.9 mg/dL (ref 8.9–10.3)
Chloride: 92 mmol/L — ABNORMAL LOW (ref 98–111)
Creatinine, Ser: 2.18 mg/dL — ABNORMAL HIGH (ref 0.61–1.24)
GFR calc Af Amer: 33 mL/min — ABNORMAL LOW (ref >60)
GFR calc non Af Amer: 29 mL/min — ABNORMAL LOW (ref >60)
Glucose, Bld: 287 mg/dL — ABNORMAL HIGH (ref 70–99)
Potassium: 3.8 mmol/L (ref 3.5–5.1)
Sodium: 134 mmol/L — ABNORMAL LOW (ref 135–145)

## 2019-07-20 LAB — URINALYSIS, ROUTINE W REFLEX MICROSCOPIC
Bilirubin Urine: NEGATIVE
Glucose, UA: NEGATIVE mg/dL
Hgb urine dipstick: NEGATIVE
Ketones, ur: NEGATIVE mg/dL
Nitrite: NEGATIVE
Protein, ur: NEGATIVE mg/dL
Specific Gravity, Urine: 1.01 (ref 1.005–1.030)
WBC, UA: >50 WBC/hpf — ABNORMAL HIGH (ref 0–5)
pH: 5 (ref 5.0–8.0)

## 2019-07-20 LAB — CBC
HCT: 41 % (ref 39.0–52.0)
Hemoglobin: 13 g/dL (ref 13.0–17.0)
MCH: 29.5 pg (ref 26.0–34.0)
MCHC: 31.7 g/dL (ref 30.0–36.0)
MCV: 93 fL (ref 80.0–100.0)
Platelets: 227 10*3/uL (ref 150–400)
RBC: 4.41 MIL/uL (ref 4.22–5.81)
RDW: 15.2 % (ref 11.5–15.5)
WBC: 7.3 10*3/uL (ref 4.0–10.5)
nRBC: 0 % (ref 0.0–0.2)

## 2019-07-20 LAB — D-DIMER, QUANTITATIVE: D-Dimer, Quant: 0.62 ug/mL-FEU — ABNORMAL HIGH (ref 0.00–0.50)

## 2019-07-20 LAB — POCT I-STAT EG7
Acid-Base Excess: 4 mmol/L — ABNORMAL HIGH (ref 0.0–2.0)
Bicarbonate: 31.7 mmol/L — ABNORMAL HIGH (ref 20.0–28.0)
Calcium, Ion: 1.06 mmol/L — ABNORMAL LOW (ref 1.15–1.40)
HCT: 37 % — ABNORMAL LOW (ref 39.0–52.0)
Hemoglobin: 12.6 g/dL — ABNORMAL LOW (ref 13.0–17.0)
O2 Saturation: 89 %
Potassium: 3.7 mmol/L (ref 3.5–5.1)
Sodium: 137 mmol/L (ref 135–145)
TCO2: 34 mmol/L — ABNORMAL HIGH (ref 22–32)
pCO2, Ven: 64.7 mmHg — ABNORMAL HIGH (ref 44.0–60.0)
pH, Ven: 7.298 (ref 7.250–7.430)
pO2, Ven: 64 mmHg — ABNORMAL HIGH (ref 32.0–45.0)

## 2019-07-20 LAB — BRAIN NATRIURETIC PEPTIDE: B Natriuretic Peptide: 23.2 pg/mL (ref 0.0–100.0)

## 2019-07-20 LAB — CBG MONITORING, ED: Glucose-Capillary: 207 mg/dL — ABNORMAL HIGH (ref 70–99)

## 2019-07-20 LAB — TROPONIN I (HIGH SENSITIVITY)
Troponin I (High Sensitivity): 13 ng/L (ref <18)
Troponin I (High Sensitivity): 11 ng/L (ref <18)

## 2019-07-20 LAB — SARS CORONAVIRUS 2 BY RT PCR (HOSPITAL ORDER, PERFORMED IN ~~LOC~~ HOSPITAL LAB): SARS Coronavirus 2: NEGATIVE

## 2019-07-20 MED ORDER — SODIUM CHLORIDE 0.9% FLUSH: 3.0000 mL | Freq: Once | INTRAVENOUS | Status: DC

## 2019-07-20 MED ORDER — GABAPENTIN 400 MG PO CAPS
400.0000 mg | ORAL_CAPSULE | Freq: Three times a day (TID) | ORAL | Status: DC
  Administered 2019-07-21 (×2): 400 mg via ORAL
  Filled 2019-07-20 (×2): qty 1

## 2019-07-20 MED ORDER — MILK AND MOLASSES ENEMA
1.0000 | Freq: Once | RECTAL | Status: DC
  Filled 2019-07-20: qty 240

## 2019-07-20 NOTE — ED Notes (Signed)
Called lab to add on trop and bnp

## 2019-07-20 NOTE — H&P (Signed)
Travis [name] YFV:[id number] DOB: Mar 19, 1946 DOA: 07/20/2019     PCP: [name], MD   [facility]  CARDS:  Dr. Acie Fredrickson, [facility]  Endocrinology Dr. Kelton Pillar  Patient arrived to ER on 07/20/19 at 1615  Patient coming from: home Lives  With family Family    Chief Complaint:  Chief Complaint  Patient presents with   Shortness of Breath    HPI: Travis [name] is a 73 y.o. male with medical history significant of diastolic CHF,  Constipation, mild COPD, degenerative disc disease.  Chronic oxygen requirement on 2.5 L at all times, peripheral neuropathy, OSA on BiPAP, diabetes mellitus type 2,  Presented with feeling more lethargic than usual oxygen noted to be as low 76% while off O2 because he sometimes takes it off although supposed to be in 2.5 L he got a back after 90s when but better on oxygen Wife attempted to use his BiPAP to see if it will wake him up but it did not seem to help He is very hard to arouse at home. He would fall asleep while talking to his wife  She called her cardiologist Dr. [name] who recommended sending patient to emergency department to evaluate for CO2 retention Was he have not had any chest pain no hemoptysis no cough no fevers  He has been very weak since he came home. At his baseline he walks a bit with a walker now he can't pull himself up  He has been constipated have not had a BM in 2-3 days He has been eating a bit less   Recently admitted to the hospital with dyspnea and lower extremity edema initially was noted to be hypoxic requiring 6 L's he was diuresed with IV Lasix requiring continuous drip and spironolactone metolazone and acetazolamide Was able to diurese about 4.7 L and improve his symptoms he was able to be discharged home 2 days ago on 20 July on 80 mg of torsemide spironolactone twice weekly metolazone up from prior once a week While hospitalized patient undergone right heart catheterization Showing mild  pulmonary hypertension  Infectious risk factors:  Reports  shortness of breath,   In  ER RAPID COVID TEST NEGATIVE  Regarding pertinent Chronic problems:     Hyperlipidemia -  on statins atorvastatin    CHF diastolic/systolic/ combined - last echo   Usually on torsemide 80 mg twice daily metolazone 2.5 weekly followed by CHF clinic  hypoThyroidism on levothyroxine   Hypertension on hydralazine   DM 2 -  Lab Results  Component Value Date   HGBA1C 10.1 (H) 05/04/2019    on insulin,  On Lantus 60 units and 18 unit of Novolog     obesity-   BMI Readings from Last 1 Encounters:  07/20/19 37.60 kg/m       COPD - not on baseline oxygen  2.5 L,     OSA -on nocturnal BiPAP     CKD stage III - baseline Cr 2.0   While in ER: VBG was done showing baseline level of hypercarbia troponin without significant changes try to ambulate patient but he was too weak to do so Cardiology consulted rec admit to medicine  The following Work up has been ordered so far:  Orders Placed This Encounter  Procedures   SARS Coronavirus 2 (CEPHEID - Performed in [place] hospital lab), [facility] Order   DG Chest Port 1 View   DG Abd 1 View   [place]  metabolic panel   CBC   Urinalysis, Routine w reflex microscopic   Brain natriuretic peptide   D-dimer, quantitative (not at [facility])   Cardiac monitoring   Saline Lock IV, Maintain IV access   Check Pulse Oximetry while ambulating   Cardiac monitoring   Inpatient consult to Cardiology  ALL PATIENTS BEING ADMITTED/HAVING PROCEDURES NEED COVID-19 SCREENING   Consult to hospitalist  ALL PATIENTS BEING ADMITTED/HAVING PROCEDURES NEED COVID-19 SCREENING   Pulse oximetry, continuous   CBG monitoring, ED   POCT I-Stat EG7   ED EKG   Place in observation (patient's expected length of stay will be less than 2 midnights)     Following Medications were ordered in ER: Medications  sodium chloride flush (NS) 0.9 %  injection 3 mL (3 mLs Intravenous Not Given 07/20/19 1905)        Consult Orders  (From admission, onward)         Start     Ordered   07/20/19 2012  Consult to hospitalist  ALL PATIENTS BEING ADMITTED/HAVING PROCEDURES NEED COVID-19 SCREENING PAGED [facility]  Once    Comments: ALL PATIENTS BEING ADMITTED/HAVING PROCEDURES NEED COVID-19 SCREENING  Provider:  (Not yet assigned)  Question Answer Comment  Place call to: [facility]   Reason for Consult Admit      07/20/19 2011          ER Provider Called:   Cardiology   Dr.Skains They Recommend admit to medicine   Will see in AM    Significant initial  Findings: Abnormal Labs Reviewed  BASIC METABOLIC PANEL - Abnormal; Notable for the following components:      Result Value   Sodium 134 (*)    Chloride 92 (*)    Glucose, Bld 287 (*)    BUN 96 (*)    Creatinine, Ser 2.18 (*)    GFR calc non Af Amer 29 (*)    GFR calc Af Amer 33 (*)    All other components within normal limits  URINALYSIS, ROUTINE W REFLEX MICROSCOPIC - Abnormal; Notable for the following components:   Leukocytes,Ua LARGE (*)    WBC, UA >50 (*)    Bacteria, UA RARE (*)    All other components within normal limits  D-DIMER, QUANTITATIVE (NOT AT [facility]) - Abnormal; Notable for the following components:   D-Dimer, Quant 0.62 (*)    All other components within normal limits  POCT I-STAT EG7 - Abnormal; Notable for the following components:   pCO2, Ven 64.7 (*)    pO2, Ven 64.0 (*)    Bicarbonate 31.7 (*)    TCO2 34 (*)    Acid-Base Excess 4.0 (*)    Calcium, Ion 1.06 (*)    HCT 37.0 (*)    Hemoglobin 12.6 (*)    All other components within normal limits     Otherwise labs showing:    Recent Labs  Lab 07/14/19 0543 07/15/19 1045 07/16/19 0334 07/17/19 0748 07/18/19 0403 07/18/19 0816 07/18/19 0854 07/20/19 1628 07/20/19 1801  NA 139 135 136 135 132* 137   135  --  134* 137  K 4.0 4.0 3.7 4.0 3.6 3.2*   3.4*  --  3.8 3.7  CO2  33* 36* 36* 35* 33*  --   --  28  --   GLUCOSE 181* 239* 215* 225* 197*  --   --  287*  --   BUN 63* 66* 75* 85* 92*  --   --  96*  --  CREATININE 1.83* 1.84* 1.97* 2.01* 2.07*  --  2.09* 2.18*  --   CALCIUM 9.3 9.6 9.5 9.8 9.2  --   --  8.9  --   MG 2.5*  --  2.6*  --   --   --   --   --   --     Cr    stable,    Lab Results  Component Value Date   CREATININE 2.18 (H) 07/20/2019   CREATININE 2.09 (H) 07/18/2019   CREATININE 2.07 (H) 07/18/2019    Recent Labs  Lab 07/14/19 0543  AST 16  ALT 14  ALKPHOS 85  BILITOT 0.6  PROT 7.2  ALBUMIN 3.3*   Lab Results  Component Value Date   CALCIUM 8.9 07/20/2019   PHOS 3.1 04/16/2018       WBC      Component Value Date/Time   WBC 7.3 07/20/2019 1628   ANC    Component Value Date/Time   NEUTROABS 4.5 07/14/2019 0543      Plt: Lab Results  Component Value Date   PLT 227 07/20/2019     Lactic Acid, Venous    Component Value Date/Time   LATICACIDVEN 1.7 05/01/2019 0725       COVID-19 Labs  Recent Labs    07/20/19 2113  DDIMER 0.62*    Lab Results  Component Value Date   SARSCOV2NAA NEGATIVE 07/20/2019   SARSCOV2NAA NEGATIVE 07/13/2019   SARSCOV2NAA NEGATIVE 05/01/2019    Venous  Blood Gas result:  pH 7.298  pCO2 64  ABG    Component Value Date/Time   PHART 7.411 07/04/2018 0235   PCO2ART 65.6 (HH) 07/04/2018 0235   PO2ART 66.7 (L) 07/04/2018 0235   HCO3 31.7 (H) 07/20/2019 1801   TCO2 34 (H) 07/20/2019 1801   ACIDBASEDEF 1.0 09/07/2015 1225   O2SAT 89.0 07/20/2019 1801    HG/HCT  stable,       Component Value Date/Time   HGB 12.6 (L) 07/20/2019 1801   HCT 37.0 (L) 07/20/2019 1801   HCT 33.8 (L) 06/29/2018 0314     Troponin 13    BNP (last 3 results) Recent Labs    05/01/19 0123 07/13/19 1845 07/20/19 1745  BNP 87.6 45.7 23.2    DM  labs:  HbA1C: Recent Labs    02/04/19 0933 05/02/19 0552 05/04/19 0514  HGBA1C 13.3* 10.0* 10.1*       CBG (last 3)  Recent Labs     07/18/19 0916  GLUCAP 181*       UA no evidence of UTI    Urine analysis:    Component Value Date/Time   COLORURINE YELLOW 07/20/2019 1728   APPEARANCEUR CLEAR 07/20/2019 1728   LABSPEC 1.010 07/20/2019 1728   PHURINE 5.0 07/20/2019 1728   GLUCOSEU NEGATIVE 07/20/2019 1728   HGBUR NEGATIVE 07/20/2019 1728   BILIRUBINUR NEGATIVE 07/20/2019 1728   KETONESUR NEGATIVE 07/20/2019 1728   PROTEINUR NEGATIVE 07/20/2019 1728   UROBILINOGEN 0.2 08/22/2015 1820   NITRITE NEGATIVE 07/20/2019 1728   LEUKOCYTESUR LARGE (A) 07/20/2019 1728      CT HEAD * NON acute  CXR -  NON acute   ECG:  Personally reviewed by me showing: HR : 66 Rhythm: NSR,   RBBB,   no evidence of ischemic changes QTC 532      ED Triage Vitals  Enc Vitals Group     BP 07/20/19 1624 (!) 120/48     Pulse Rate 07/20/19 1624 69  Resp 07/20/19 1624 (!) 22     Temp 07/20/19 1624 97.9 F (36.6 C)     Temp Source 07/20/19 1624 Oral     SpO2 07/20/19 1624 95 %     Weight 07/20/19 1743 285 lb (129.3 kg)     Height 07/20/19 1743 6' 1"  (1.854 m)     Head Circumference --      Peak Flow --      Pain Score 07/20/19 1626 0     Pain Loc --      Pain Edu? --      Excl. in [place]? --   TMAX(24)@       Latest  Blood pressure 131/63, pulse 72, temperature 97.9 F (36.6 C), temperature source Oral, resp. rate (!) 22, height 6' 1"  (1.854 m), weight 129.3 kg, SpO2 96 %.    Hospitalist was called for admission for   Review of Systems:    Pertinent positives include:fatigue,   shortness of breath at rest.  Constitutional:  No weight loss, night sweats, Fevers, chills, weight loss  HEENT:  No headaches, Difficulty swallowing,Tooth/dental problems,Sore throat,  No sneezing, itching, ear ache, nasal congestion, post nasal drip,  Cardio-vascular:  No chest pain, Orthopnea, PND, anasarca, dizziness, palpitations.no Bilateral lower extremity swelling  GI:  No heartburn, indigestion, abdominal pain, nausea, vomiting,  diarrhea, change in bowel habits, loss of appetite, melena, blood in stool, hematemesis Resp:  no No dyspnea on exertion, No excess mucus, no productive cough, No non-productive cough, No coughing up of blood.No change in color of mucus.No wheezing. Skin:  no rash or lesions. No jaundice GU:  no dysuria, change in color of urine, no urgency or frequency. No straining to urinate.  No flank pain.  Musculoskeletal:  No joint pain or no joint swelling. No decreased range of motion. No back pain.  Psych:  No change in mood or affect. No depression or anxiety. No memory loss.  Neuro: no localizing neurological complaints, no tingling, no weakness, no double vision, no gait abnormality, no slurred speech, no confusion  All systems reviewed and apart from [place] all are negative  Past Medical History:   Past Medical History:  Diagnosis Date   Cervical myelopathy (HCC)    with myelomalacia at C5-6 and C3-4   CHF (congestive heart failure) (HCC)    Constipation - functional    controlled with miralax   COPD (chronic obstructive pulmonary disease) (HCC)    mild   DJD (degenerative joint disease)    WHOLE SPINE   Dyspnea    Gait disorder    Heart murmur    years ago    Hypertension    Hypothyroidism    Low d/t treatment of hyperthyroidism   Lumbosacral spinal stenosis 03/13/2014   On home oxygen therapy    "2.5L all the time" (06/29/2018)   Peripheral neuropathy    Pneumonia    PONV (postoperative nausea and vomiting)    RBBB    Renal disorder    acute kidney failure   Restless legs syndrome 03/14/2015   RLS (restless legs syndrome)    Sleep apnea    DX  2009/2010 [facility] done at [facility]   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   Type II diabetes mellitus ([place])    diet controlled       Past Surgical History:  Procedure Laterality Date   BACK SURGERY     6 surgeries   CARDIAC CATHETERIZATION [name] 09/07/2015   Procedure: Right/Left Heart  Cath and Coronary  Angiography;  Surgeon: Peter M Martinique, MD;  Location: [place] [facility];  Service: Cardiovascular;  Laterality: [name];   CHOLECYSTECTOMY     EYE SURGERY     BIL CATARACTS   LUMBAR LAMINECTOMY/DECOMPRESSION MICRODISCECTOMY  07/14/2012   Procedure: LUMBAR LAMINECTOMY/DECOMPRESSION MICRODISCECTOMY 2 LEVELS;  Surgeon: Eustace Moore, MD;  Location: [place] [facility];  Service: Neurosurgery;  Laterality: Left;  Lumbar two three laminectomy, left thoracic four five transpedicular diskectomy   POSTERIOR CERVICAL FUSION/FORAMINOTOMY  04/08/2018   Procedure: Laminectomy and Foraminotomy - Lumbar Four-Lumbar Five, instrumented posterolateral fusion Lumbar Four- Five, Laminectomy and Foraminotomy - Thoracic One-Thoracic Two;  Surgeon: Eustace Moore, MD;  Location: [facility];  Service: Neurosurgery;;  posterior   POSTERIOR LUMBAR FUSION  04/08/2018   Laminectomy and Foraminotomy - Lumbar Four-Lumbar Five, instrumented posterolateral fusion Lumbar Four- Five, Laminectomy and Foraminotomy - Thoracic One-Thoracic Two   POSTERIOR LUMBAR FUSION  04/08/2018   Laminectomy and Foraminotomy - Lumbar Four-Lumbar Five, instrumented posterolateral fusion Lumbar Four- Five, Laminectomy and Foraminotomy - Thoracic One-Thoracic Two   RIGHT HEART CATH [name] 07/18/2019   Procedure: RIGHT HEART CATH;  Surgeon: Jolaine Artist, MD;  Location: [place] [facility];  Service: Cardiovascular;  Laterality: [name];    Social History:  Ambulatory  walker        reports that he quit smoking about 34 years ago. His smoking use included cigarettes. He has a 40.00 pack-year smoking history. He quit smokeless tobacco use about 34 years ago. He reports that he does not drink alcohol or use drugs.     Family History:   Family History  Problem Relation Age of Onset   Polycythemia Mother    Diabetes Father    Hypertension Father    Heart disease Father    CAD Other        46 of his 98 brothers and father has CAD     Allergies: Allergies  Allergen Reactions   Amlodipine Swelling and Other (See Comments)    Causes edema (per [facility] paperwork)   Plendil [Felodipine] Swelling and Other (See Comments)    Causes edema (per [facility] paperwork)   Compazine [Prochlorperazine] Other (See Comments)    Caused agitation (per [facility] paperwork)   Cymbalta [Duloxetine Hcl] Other (See Comments)    Made the patient "feel weird," per [facility] paperwork   Jardiance [Empagliflozin] Other (See Comments)    Stopped by MD because of unwanted side effects   Other Nausea Only and Other (See Comments)    ANESTHESIA UNSPECIFIED- makes sick, must have anti-nausea medication in advance   Levofloxacin In D5w Itching    Possible infusion reaction 03/29/17. Tolerating oral Levaquin 03/30/17   Septra [Sulfamethoxazole-Trimethoprim] Nausea Only and Other (See Comments)    Per [facility] paperwork     Prior to Admission medications   Medication Sig Start Date End Date Taking? Authorizing Provider  aspirin EC 81 MG tablet Take 81 mg by mouth daily.    [provider]  atorvastatin (LIPITOR) 20 MG tablet Take 20 mg by mouth every evening.     [provider]  Blood Glucose Monitoring Suppl (ONETOUCH VERIO) w/Device KIT 1 kit by Does not apply route 4 (four) times daily. Use as directed to test blood sugars 4 times daily DX E11.42 03/04/19   Shamleffer, Melanie Crazier, MD  Continuous Blood Gluc Receiver (FREESTYLE LIBRE 14 DAY READER) DEVI 1 kit by Does not apply route 3 (three) times  daily. 02/07/19   Shamleffer, Melanie Crazier, MD  Continuous Blood Gluc Sensor (FREESTYLE LIBRE 14 DAY SENSOR) MISC 1 kit by Does not apply route 3 (three) times daily. 02/07/19   Shamleffer, Melanie Crazier, MD  escitalopram (LEXAPRO) 20 MG tablet Take 20 mg by mouth daily.    [provider]  gabapentin (NEURONTIN) 400 MG capsule Take 400 mg by mouth 3 (three) times daily.     [provider]  glucose blood test strip Use as instructed to test blood sugars 4 times daily DX E11.42 03/04/19   Shamleffer, Melanie Crazier, MD  hydrALAZINE (APRESOLINE) 25 MG tablet Take 25 mg by mouth 3 (three) times daily.    [provider]  insulin aspart (NOVOLOG FLEXPEN) 100 UNIT/ML FlexPen Inject 18 Units into the skin 3 (three) times daily with meals. 03/07/19   Shamleffer, Melanie Crazier, MD  Insulin Glargine (LANTUS SOLOSTAR) 100 UNIT/ML Solostar Pen Inject 60 Units into the skin at bedtime. 03/04/19   Shamleffer, Melanie Crazier, MD  Insulin Pen Needle (BD PEN NEEDLE MICRO U/F) 32G X 6 MM MISC Four times daily 02/04/19   Shamleffer, Melanie Crazier, MD  Insulin Pen Needle (BD PEN NEEDLE MICRO U/F) 32G X 6 MM MISC 4 times a day 03/04/19   Shamleffer, Melanie Crazier, MD  Lancets [facility] ULTRASOFT) lancets Use as instructed to test blood sugars 4 times daily DX E11.42 03/04/19   Shamleffer, Melanie Crazier, MD  levothyroxine (SYNTHROID, LEVOTHROID) 175 MCG tablet Take 1 tablet (175 mcg total) by mouth daily. 05/12/18   Angiulli, Lavon Paganini, [name]  metolazone (ZAROXOLYN) 2.5 MG tablet Take 1 tablet (2.5 mg total) by mouth 2 (two) times a week. Monday AND FRIDAY 07/18/19   Arrien, Jimmy Picket, MD  multivitamin (ONE-A-DAY MEN'S) TABS tablet Take 1 tablet by mouth at bedtime.    [provider]  OXYGEN Inhale 2-3 Units into the lungs at bedtime.    [provider]  polyethylene glycol (MIRALAX / GLYCOLAX) packet Take 17 g by mouth daily as needed. For constipation Patient taking differently: Take 17 g by mouth at bedtime.  08/09/14   Love, Ivan Anchors, [name]  potassium chloride SA (K-DUR) 20 MEQ tablet Take 1 tablet (20 mEq total) by mouth 2 (two) times daily. Take 1 tab only on days you take Metolazone 05/11/19   Mercy Riding, MD  pramipexole (MIRAPEX) 0.5 MG tablet One tablet in the morning and 2 in the evening Patient taking differently: Take 0.5 mg by mouth 2 (two)  times daily.  11/24/18   Kathrynn Ducking, MD  spironolactone (ALDACTONE) 25 MG tablet Take 1 tablet (25 mg total) by mouth daily. 10/29/18   Nahser, Wonda Cheng, MD  tamsulosin (FLOMAX) 0.4 MG CAPS capsule Take 1 capsule (0.4 mg total) by mouth daily. Patient taking differently: Take 0.4 mg by mouth at bedtime.  05/12/18   Angiulli, Lavon Paganini, [name]  torsemide (DEMADEX) 20 MG tablet Take 4 tablets (80 mg total) by mouth 2 (two) times daily. 05/18/19   Larey Dresser, MD  TOVIAZ 8 MG TB24 tablet Take 1 tablet (8 mg total) by mouth daily. 05/12/18   Angiulli, Lavon Paganini, [name]   Physical Exam: Blood pressure 131/63, pulse 72, temperature 97.9 F (36.6 C), temperature source Oral, resp. rate (!) 22, height 6' 1"  (1.854 m), weight 129.3 kg, SpO2 96 %. 1. General:  in No  Acute distress  Chronically ill  -appearing 2. Psychological: Alert and Oriented 3. Head/ENT:    Dry  Mucous Membranes                          Head Non traumatic, neck supple                           Poor Dentition 4. SKIN:    decreased Skin turgor,  Skin clean Dry and intact no rash 5. Heart: Regular rate and rhythm no  Murmur, no Rub or gallop 6. Lungs:  no wheezes or crackles  dsitant 7. Abdomen: Soft, non-tender,  distended  obese  bowel sounds present 8. Lower extremities: no clubbing, cyanosis, trace edema, ACE bandages in place 9. Neurologically   strength 5 out of 5 in all 4 extremities cranial nerves II through XII intact 10. MSK: Normal range of motion   All other LABS:     Recent Labs  Lab 07/14/19 0543 07/18/19 0816 07/18/19 0854 07/20/19 1628 07/20/19 1801  WBC 6.2  --  7.7 7.3  --   NEUTROABS 4.5  --   --   --   --   HGB 12.1* 13.3   13.6 12.8* 13.0 12.6*  HCT 39.6 39.0   40.0 39.8 41.0 37.0*  MCV 94.3  --  91.1 93.0  --   PLT 220  --  227 227  --      Recent Labs  Lab 07/14/19 0543 07/15/19 1045 07/16/19 0334 07/17/19 0748 07/18/19 0403 07/18/19 0816 07/18/19 0854 07/20/19 1628 07/20/19 1801   NA 139 135 136 135 132* 137   135  --  134* 137  K 4.0 4.0 3.7 4.0 3.6 3.2*   3.4*  --  3.8 3.7  CL 93* 86* 85* 85* 86*  --   --  92*  --   CO2 33* 36* 36* 35* 33*  --   --  28  --   GLUCOSE 181* 239* 215* 225* 197*  --   --  287*  --   BUN 63* 66* 75* 85* 92*  --   --  96*  --   CREATININE 1.83* 1.84* 1.97* 2.01* 2.07*  --  2.09* 2.18*  --   CALCIUM 9.3 9.6 9.5 9.8 9.2  --   --  8.9  --   MG 2.5*  --  2.6*  --   --   --   --   --   --      Recent Labs  Lab 07/14/19 0543  AST 16  ALT 14  ALKPHOS 85  BILITOT 0.6  PROT 7.2  ALBUMIN 3.3*       Cultures:    Component Value Date/Time   SDES BLOOD RIGHT ARM 05/01/2019 0352   SDES BLOOD LEFT ARM 05/01/2019 0352   SPECREQUEST  05/01/2019 0352    BOTTLES DRAWN AEROBIC AND ANAEROBIC Blood Culture results may not be optimal due to an excessive volume of blood received in culture bottles   SPECREQUEST  05/01/2019 0352    BOTTLES DRAWN AEROBIC ONLY Blood Culture adequate volume   CULT  05/01/2019 0352    NO GROWTH 5 DAYS Performed at [facility], [place] [address], [place], [place] [address]    CULT  05/01/2019 0352    NO GROWTH 5 DAYS Performed at [facility], [place] [address], [place], [place] [address]    REPTSTATUS 05/06/2019 FINAL 05/01/2019 0352   REPTSTATUS 05/06/2019 FINAL 05/01/2019 0352  Radiological Exams on Admission: Dg Chest Port 1 View  Result Date: 07/20/2019 CLINICAL DATA:  Fatigue and shortness of breath. Recently discharged for CHF exacerbation. EXAM: PORTABLE CHEST 1 VIEW COMPARISON:  Chest x-ray dated July 13, 2019. FINDINGS: Stable cardiomediastinal silhouette partially obscured by elevated hemidiaphragms. Atherosclerotic calcification of the aortic arch. Normal pulmonary vascularity. Persistent low lung volumes with bibasilar atelectasis. No focal consolidation, pleural effusion, or pneumothorax. No acute osseous abnormality. IMPRESSION: 1. Persistent low lung volumes with bibasilar atelectasis.  Electronically Signed   By: Titus Dubin M.D.   On: 07/20/2019 19:12    Chart has been reviewed    Assessment/Plan  73 y.o. male with medical history significant of diastolic CHF,  Constipation, mild COPD, degenerative disc disease.  Chronic oxygen requirement on 2.5 L at all times, peripheral neuropathy, OSA, diabetes mellitus type 2  Admitted for lethargy  Present on Admission:  Acute metabolic encephalopathy -in the setting of significant recent diuresis chronic compensated hypercarbia and hypoxia Recent procedures if possible prolonged clearing of sedation Will observe overnight patient states he is already feeling somewhat better wife is  concerned about stroke patient is neurologically intact but given significant change from baseline obtain noncontrasted CT VBG close to baseline Will hold tonight's dose of Lexapro and Neurontin    Acute on chronic respiratory failure with hypoxia and hypercapnia (HCC) -close to baseline continue BiPAP at night and oxygen throughout the day  Restless legs syndrome stable continue home medications  Hypothyroid - -  TSH recently checked continue home medications at current dose   Stage 3 chronic kidney disease (HCC) avoid nephrotoxic medications currently creatinine around baseline  Excessive daytime sleepiness - VBG at baseline   Benign essential HTN stable continue home medications   COPD (chronic obstructive pulmonary disease) (HCC) stable currently no evidence of COPD exacerbation no cough or wheezing  Debility will need PT OT evaluation prior to discharge Constipation will obtain KUB significant abdominal distention could contributed to shortness of breath due to restrictive mechanism.  If needed will order bowel regimen DM 2-  - Order  moderate SSI   - continue home insulin regimen   -  check TSH and HgA1C     Other plan as per orders.  DVT prophylaxis:    Lovenox     Code Status:  FULL CODE   as per  family  I had  personally discussed CODE STATUS with  Family      Family Communication:   Family not at  Bedside  plan of care was discussed on the phone with  Wife,    Disposition Plan:    likely will need placement for rehabilitation                   Would benefit from PT/OT eval prior to DC  Ordered                                      Nutrition    consulted                                     Consults called: Cardiology   Admission status:  ED Disposition    ED Disposition Condition [place]: [place] [100100]  Level of Care: Progressive [102]  I expect the  patient will be discharged within 24 hours: No (not a candidate for 5C-Observation unit)  Covid Evaluation: Asymptomatic Screening Protocol (No Symptoms)  Diagnosis: Acute metabolic encephalopathy [[id number]]  Admitting Physician: Toy Baker [[id number]]  Attending Physician: Toy Baker [[id number]]  PT Class (Do Not Modify): Observation [104]  PT Acc Code (Do Not Modify): Observation [10022]       Obs    Level of care        SDU tele indefinitely please discontinue once patient no longer qualifies  Precautions:  NONE  No active isolations  PPE: Used by the provider:   [id number]  eye Goggles,  Gloves      Rocket Gunderson 07/20/2019, 9:33 PM    [facility]     after 2 AM please page floor coverage PA If 7AM-7PM, please contact the day team taking care of the patient using [email or web address]

## 2019-07-20 NOTE — ED Notes (Signed)
Pt spoke with wife and dtr on the phone

## 2019-07-20 NOTE — ED Notes (Signed)
Provided Dr Tyrone Nine with numbers for family so he can update them. Pt is talking to his wife and dtr regularly.

## 2019-07-20 NOTE — ED Triage Notes (Signed)
Pt recently discharged from here acute CHF exacerbation. . Pt reports shortness of breath,  Fatigue and weakness. . Pt's family reports pt's O2 sats have been in 60s and had pt using C-pap at home. Pt denies pain. O2 sats 94-96% on 2L [place], which pt typically wears at home.

## 2019-07-20 NOTE — ED Provider Notes (Signed)
[facility] EMERGENCY DEPARTMENT Provider Note   CSN: [id number] Arrival date & time: 07/20/19  1615    History   Chief Complaint Chief Complaint  Patient presents with  . Shortness of Breath    HPI Travis [name] is a 73 y.o. male.     73 yo M with a chief complaints of shortness of breath hypoxia and confusion.  This been going on for a few days.  Patient was recently discharged from the hospital after being admitted for a CHF exacerbation.  Had about 4 L taken off and since then has been doing somewhat better but started having worsening trouble breathing over the past couple days.  Has been using his CPAP because his oxygen saturation will drop into the 70s upon exertion.  No history of PE or DVT.  No hemoptysis no cough no fever.  No chest pain.  The history is provided by the patient.  Shortness of Breath Severity:  Moderate Onset quality:  Gradual Duration:  2 days Timing:  Constant Progression:  Worsening Chronicity:  New Relieved by:  Nothing Worsened by:  Nothing Ineffective treatments:  None tried Associated symptoms: cough   Associated symptoms: no abdominal pain, no chest pain, no fever, no headaches, no rash and no vomiting     Past Medical History:  Diagnosis Date  . Cervical myelopathy (HCC)    with myelomalacia at C5-6 and C3-4  . CHF (congestive heart failure) ([place])   . Constipation - functional    controlled with miralax  . COPD (chronic obstructive pulmonary disease) (HCC)    mild  . DJD (degenerative joint disease)    WHOLE SPINE  . Dyspnea   . Gait disorder   . Heart murmur    years ago   . Hypertension   . Hypothyroidism    Low d/t treatment of hyperthyroidism  . Lumbosacral spinal stenosis 03/13/2014  . On home oxygen therapy    "2.5L all the time" (06/29/2018)  . Peripheral neuropathy   . Pneumonia   . PONV (postoperative nausea and vomiting)   . RBBB   . Renal disorder    acute kidney failure  . Restless legs  syndrome 03/14/2015  . RLS (restless legs syndrome)   . Sleep apnea    DX  2009/2010 [facility] done at [facility]  . 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 Type II diabetes mellitus ([place])    diet controlled    Patient Active Problem List   Diagnosis Date Noted  . Acute metabolic encephalopathy 62/95/2841  . Acute CHF (congestive heart failure) ([place]) 07/13/2019  . Anasarca 05/01/2019  . Cellulitis of lower extremity 05/01/2019  . COPD (chronic obstructive pulmonary disease) ([place]) 05/01/2019  . Depression 05/01/2019  . RLS (restless legs syndrome) 05/01/2019  . Type 2 diabetes mellitus with stage 3 chronic kidney disease, with long-term current use of insulin ([place]) 04/15/2019  . Insulin dependent diabetes mellitus ([place]) 04/15/2019  . Pressure injury of skin 11/21/2018  . Complicated urinary tract infection 11/20/2018  . Acute encephalopathy 06/28/2018  . Oxygen desaturation   . Supplemental oxygen dependent   . Type 2 diabetes mellitus with hyperglycemia, with long-term current use of insulin ([place])   . Labile blood pressure   . Labile blood glucose   . Myelopathy ([place]) 04/16/2018  . Benign essential HTN   . Benign prostatic hyperplasia with urinary retention   . Acute on chronic respiratory failure with hypoxia and hypercapnia (HCC)   .  DDD (degenerative disc disease), cervical   . Obesity, Class III, BMI 40-49.9 (morbid obesity) ([place])   . S/P lumbar spinal fusion 04/08/2018  . 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 Myelopathy, spondylogenic, cervical 11/11/2017  . Excessive daytime sleepiness 11/11/2017  . 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 Debility 10/08/2017  . Stage 3 chronic kidney disease ([place])   . Spinal stenosis of lumbar region   . Acute blood loss anemia   . Urinary tract infection without hematuria   . Acute on chronic diastolic CHF (congestive heart failure) ([place])   . Elevated troponin I level   . Hypothyroid 08/19/2015   . Restless legs syndrome 03/14/2015  . Shortness of breath 03/09/2015  . Sinus bradycardia 09/21/2014  . Thoracic spondylosis with myelopathy 09/13/2014  . Cervical arthritis with myelopathy 07/18/2014  . Constipation - functional   . Sepsis ([place]) 07/10/2014  . OSA on CPAP 07/10/2014  . Edema leg 07/10/2014  . Lumbosacral spinal stenosis 03/13/2014  . Other vitamin B12 deficiency anemia 07/13/2013  . 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 Polyneuropathy in other diseases classified elsewhere ([place]) 02/17/2013  . Other general symptoms(780.99) 02/17/2013  . Abnormality of gait 02/17/2013  . Cervical spondylosis with myelopathy 02/17/2013  . Back ache 09/15/2012    Past Surgical History:  Procedure Laterality Date  . BACK SURGERY     6 surgeries  . CARDIAC CATHETERIZATION [name] 09/07/2015   Procedure: Right/Left Heart Cath and Coronary Angiography;  Surgeon: Peter M Martinique, MD;  Location: [facility];  Service: Cardiovascular;  Laterality: [name];  . CHOLECYSTECTOMY    . EYE SURGERY     BIL CATARACTS  . LUMBAR LAMINECTOMY/DECOMPRESSION MICRODISCECTOMY  07/14/2012   Procedure: LUMBAR LAMINECTOMY/DECOMPRESSION MICRODISCECTOMY 2 LEVELS;  Surgeon: Eustace Moore, MD;  Location: [facility];  Service: Neurosurgery;  Laterality: Left;  Lumbar two three laminectomy, left thoracic four five transpedicular diskectomy  . POSTERIOR CERVICAL FUSION/FORAMINOTOMY  04/08/2018   Procedure: Laminectomy and Foraminotomy - Lumbar Four-Lumbar Five, instrumented posterolateral fusion Lumbar Four- Five, Laminectomy and Foraminotomy - Thoracic One-Thoracic Two;  Surgeon: Eustace Moore, MD;  Location: [facility] OR;  Service: Neurosurgery;;  posterior  . POSTERIOR LUMBAR FUSION  04/08/2018   Laminectomy and Foraminotomy - Lumbar Four-Lumbar Five, instrumented posterolateral fusion Lumbar Four- Five, Laminectomy and Foraminotomy - Thoracic One-Thoracic Two  . POSTERIOR LUMBAR FUSION   04/08/2018   Laminectomy and Foraminotomy - Lumbar Four-Lumbar Five, instrumented posterolateral fusion Lumbar Four- Five, Laminectomy and Foraminotomy - Thoracic One-Thoracic Two  . RIGHT HEART CATH [name] 07/18/2019   Procedure: RIGHT HEART CATH;  Surgeon: Jolaine Artist, MD;  Location: [facility];  Service: Cardiovascular;  Laterality: [name];        Home Medications    Prior to Admission medications   Medication Sig Start Date End Date Taking? Authorizing Provider  aspirin EC 81 MG tablet Take 81 mg by mouth daily.    [provider]  atorvastatin (LIPITOR) 20 MG tablet Take 20 mg by mouth every evening.     [provider]  Blood Glucose Monitoring Suppl (ONETOUCH VERIO) w/Device KIT 1 kit by Does not apply route 4 (four) times daily. Use as directed to test blood sugars 4 times daily DX E11.42 03/04/19   Shamleffer, Melanie Crazier, MD  Continuous Blood Gluc Receiver (FREESTYLE LIBRE 14 DAY READER) DEVI 1 kit by Does not apply route 3 (three) times daily. 02/07/19  Shamleffer, Melanie Crazier, MD  Continuous Blood Gluc Sensor (FREESTYLE LIBRE 14 DAY SENSOR) MISC 1 kit by Does not apply route 3 (three) times daily. 02/07/19   Shamleffer, Melanie Crazier, MD  escitalopram (LEXAPRO) 20 MG tablet Take 20 mg by mouth daily.    [provider]  gabapentin (NEURONTIN) 400 MG capsule Take 400 mg by mouth 3 (three) times daily.    [provider]  glucose blood test strip Use as instructed to test blood sugars 4 times daily DX E11.42 03/04/19   Shamleffer, Melanie Crazier, MD  hydrALAZINE (APRESOLINE) 25 MG tablet Take 25 mg by mouth 3 (three) times daily.    [provider]  insulin aspart (NOVOLOG FLEXPEN) 100 UNIT/ML FlexPen Inject 18 Units into the skin 3 (three) times daily with meals. 03/07/19   Shamleffer, Melanie Crazier, MD  Insulin Glargine (LANTUS SOLOSTAR) 100 UNIT/ML Solostar Pen Inject 60 Units into the skin at bedtime. 03/04/19    Shamleffer, Melanie Crazier, MD  Insulin Pen Needle (BD PEN NEEDLE MICRO U/F) 32G X 6 MM MISC Four times daily 02/04/19   Shamleffer, Melanie Crazier, MD  Insulin Pen Needle (BD PEN NEEDLE MICRO U/F) 32G X 6 MM MISC 4 times a day 03/04/19   Shamleffer, Melanie Crazier, MD  Lancets [facility] ULTRASOFT) lancets Use as instructed to test blood sugars 4 times daily DX E11.42 03/04/19   Shamleffer, Melanie Crazier, MD  levothyroxine (SYNTHROID, LEVOTHROID) 175 MCG tablet Take 1 tablet (175 mcg total) by mouth daily. 05/12/18   Angiulli, Lavon Paganini, [name]  metolazone (ZAROXOLYN) 2.5 MG tablet Take 1 tablet (2.5 mg total) by mouth 2 (two) times a week. Monday AND FRIDAY 07/18/19   Arrien, Jimmy Picket, MD  multivitamin (ONE-A-DAY MEN'S) TABS tablet Take 1 tablet by mouth at bedtime.    [provider]  OXYGEN Inhale 2-3 Units into the lungs at bedtime.    [provider]  polyethylene glycol (MIRALAX / GLYCOLAX) packet Take 17 g by mouth daily as needed. For constipation Patient taking differently: Take 17 g by mouth at bedtime.  08/09/14   Love, Ivan Anchors, [name]  potassium chloride SA (K-DUR) 20 MEQ tablet Take 1 tablet (20 mEq total) by mouth 2 (two) times daily. Take 1 tab only on days you take Metolazone 05/11/19   Mercy Riding, MD  pramipexole (MIRAPEX) 0.5 MG tablet One tablet in the morning and 2 in the evening Patient taking differently: Take 0.5 mg by mouth 2 (two) times daily.  11/24/18   Kathrynn Ducking, MD  spironolactone (ALDACTONE) 25 MG tablet Take 1 tablet (25 mg total) by mouth daily. 10/29/18   Nahser, Wonda Cheng, MD  tamsulosin (FLOMAX) 0.4 MG CAPS capsule Take 1 capsule (0.4 mg total) by mouth daily. Patient taking differently: Take 0.4 mg by mouth at bedtime.  05/12/18   Angiulli, Lavon Paganini, [name]  torsemide (DEMADEX) 20 MG tablet Take 4 tablets (80 mg total) by mouth 2 (two) times daily. 05/18/19   Larey Dresser, MD  TOVIAZ 8 MG TB24 tablet Take 1 tablet (8 mg total) by mouth  daily. 05/12/18   Angiulli, Lavon Paganini, [name]    Family History Family History  Problem Relation Age of Onset  . Polycythemia Mother   . Diabetes Father   . Hypertension Father   . Heart disease Father   . CAD Other        1 of his 4 brothers and father has CAD    Social History Social  History   Tobacco Use  . Smoking status: Former Smoker    Packs/day: 2.00    Years: 20.00    Pack years: 40.00    Types: Cigarettes    Quit date: 06/28/1985    Years since quitting: 34.0  . Smokeless tobacco: Former Systems developer    Quit date: 06/28/1985  Substance Use Topics  . Alcohol use: No  . Drug use: No     Allergies   Amlodipine, Plendil [felodipine], Compazine [prochlorperazine], Cymbalta [duloxetine hcl], Jardiance [empagliflozin], Other, Levofloxacin in d5w, and Septra [sulfamethoxazole-trimethoprim]   Review of Systems Review of Systems  Constitutional: Negative for chills and fever.  HENT: Negative for congestion and facial swelling.   Eyes: Negative for discharge and visual disturbance.  Respiratory: Positive for cough and shortness of breath.   Cardiovascular: Negative for chest pain and palpitations.  Gastrointestinal: Negative for abdominal pain, diarrhea and vomiting.  Musculoskeletal: Negative for arthralgias and myalgias.  Skin: Negative for color change and rash.  Neurological: Negative for tremors, syncope and headaches.  Psychiatric/Behavioral: Negative for confusion and dysphoric mood.     Physical Exam Updated Vital Signs BP (!) 153/132 (BP Location: Right Arm)   Pulse 84   Temp 97.9 F (36.6 C) (Oral)   Resp (!) 31   Ht _0  (1.854 m)   Wt 129.3 kg   SpO2 (!) 89%   BMI 37.60 kg/m   Physical Exam Vitals signs and nursing note reviewed.  Constitutional:      Appearance: He is well-developed.  HENT:     Head: Normocephalic and atraumatic.  Eyes:     Pupils: Pupils are equal, round, and reactive to light.  Neck:     Musculoskeletal: Normal range of motion  and neck supple.     Vascular: No JVD.  Cardiovascular:     Rate and Rhythm: Normal rate and regular rhythm.     Heart sounds: No murmur. No friction rub. No gallop.   Pulmonary:     Effort: No respiratory distress.     Breath sounds: No wheezing.  Abdominal:     General: There is no distension.     Tenderness: There is no guarding or rebound.  Musculoskeletal: Normal range of motion.  Skin:    Coloration: Skin is not pale.     Findings: No rash.  Neurological:     Mental Status: He is alert and oriented to person, place, and time.  Psychiatric:        Behavior: Behavior normal.      ED Treatments / Results  Labs (all labs ordered are listed, but only abnormal results are displayed) Labs Reviewed  BASIC METABOLIC PANEL - Abnormal; Notable for the following components:      Result Value   Sodium 134 (*)    Chloride 92 (*)    Glucose, Bld 287 (*)    BUN 96 (*)    Creatinine, Ser 2.18 (*)    GFR calc non Af Amer 29 (*)    GFR calc Af Amer 33 (*)    All other components within normal limits  URINALYSIS, ROUTINE W REFLEX MICROSCOPIC - Abnormal; Notable for the following components:   Leukocytes,Ua LARGE (*)    WBC, UA >50 (*)    Bacteria, UA RARE (*)    All other components within normal limits  D-DIMER, QUANTITATIVE (NOT AT [facility]) - Abnormal; Notable for the following components:   D-Dimer, Quant 0.62 (*)    All other components within normal limits  POCT I-STAT EG7 - Abnormal; Notable for the following components:   pCO2, Ven 64.7 (*)    pO2, Ven 64.0 (*)    Bicarbonate 31.7 (*)    TCO2 34 (*)    Acid-Base Excess 4.0 (*)    Calcium, Ion 1.06 (*)    HCT 37.0 (*)    Hemoglobin 12.6 (*)    All other components within normal limits  SARS CORONAVIRUS 2 (HOSPITAL ORDER, [facility])  CBC  BRAIN NATRIURETIC PEPTIDE  CBG MONITORING, ED  TROPONIN I (HIGH SENSITIVITY)  TROPONIN I (HIGH SENSITIVITY)    EKG EKG Interpretation  Date/Time:   Wednesday July 20 2019 16:35:00 EDT Ventricular Rate:  66 PR Interval:  162 QRS Duration: 138 QT Interval:  508 QTC Calculation: 532 R Axis:   28 Text Interpretation:  Normal sinus rhythm Right bundle branch block Abnormal ECG No significant change since last tracing Confirmed by Deno Etienne [phone]) on 07/20/2019 5:33:17 PM   Radiology Dg Abd 1 View  Result Date: 07/20/2019 CLINICAL DATA:  Constipation for the past 2-3 days. EXAM: ABDOMEN - 1 VIEW COMPARISON:  CT abdomen pelvis dated November 29, 2018. FINDINGS: Unchanged prominent gaseous distention of the stomach. The bowel gas pattern is normal. Large amount of stool throughout the colon. No radio-opaque calculi or other significant radiographic abnormality are seen. No acute osseous abnormality. IMPRESSION: Large colonic stool burden. Electronically Signed   By: Titus Dubin M.D.   On: 07/20/2019 21:54   Dg Chest Port 1 View  Result Date: 07/20/2019 CLINICAL DATA:  Fatigue and shortness of breath. Recently discharged for CHF exacerbation. EXAM: PORTABLE CHEST 1 VIEW COMPARISON:  Chest x-ray dated July 13, 2019. FINDINGS: Stable cardiomediastinal silhouette partially obscured by elevated hemidiaphragms. Atherosclerotic calcification of the aortic arch. Normal pulmonary vascularity. Persistent low lung volumes with bibasilar atelectasis. No focal consolidation, pleural effusion, or pneumothorax. No acute osseous abnormality. IMPRESSION: 1. Persistent low lung volumes with bibasilar atelectasis. Electronically Signed   By: Titus Dubin M.D.   On: 07/20/2019 19:12    Procedures Procedures (including critical care time)  Medications Ordered in ED Medications  sodium chloride flush (NS) 0.9 % injection 3 mL (3 mLs Intravenous Not Given 07/20/19 1905)  gabapentin (NEURONTIN) capsule 400 mg (has no administration in time range)  milk and molasses enema (has no administration in time range)     Initial Impression / Assessment and Plan /  ED Course  I have reviewed the triage vital signs and the nursing notes.  Pertinent labs & imaging results that were available during my care of the patient were reviewed by me and considered in my medical decision making (see chart for details).        73 yo M with a chief complaint of shortness of breath.  Recently in the hospital for CHF exacerbation.  Had 4 L of fluid brought out with some improvement.  Second have some confusion and some transient hypoxia requiring CPAP at home.  VBG here without hypercarbia.  Troponin negative.  Will attempt to ambulate.  Patient is unable to be ambulated due to weakness.  I had a discussion with the family and they told me that he had been having trouble getting up and moving around and was increasingly lethargic over the past couple days.  They thought this was may be due to the sedation but felt he was getting worse.  Having recurrent episodes where he spontaneously take off his oxygen and they find him  in the 70s.  Today it improved with BiPAP and then they were able to transition him nasal cannula with continued good saturations but he was still having increased work of breathing and they called her cardiologist and had him sent here for evaluation.  With him unable to get around at home.  I will discuss with the hospitalist for continued evaluation and possible physical therapy.   I did discuss the case with Dr. Marlou Porch, cardiology based on the history and looking through the patient's chart he felt the patient would be best served on the medical service and that the heart failure team would see the patient in the morning.   The patients results and plan were reviewed and discussed.   Any x-rays performed were independently reviewed by myself.   Differential diagnosis were considered with the presenting HPI.  Medications  sodium chloride flush (NS) 0.9 % injection 3 mL (3 mLs Intravenous Not Given 07/20/19 1905)  gabapentin (NEURONTIN) capsule 400  mg (has no administration in time range)  milk and molasses enema (has no administration in time range)    Vitals:   07/20/19 2118 07/20/19 2125 07/20/19 2130 07/20/19 2252  BP:   110/61 (!) 153/132  Pulse:    84  Resp: 19  (!) 26 (!) 31  Temp:      TempSrc:      SpO2:  94%  (!) 89%  Weight:      Height:        Final diagnoses:  SOB (shortness of breath)  Confusion  Weakness    Admission/ observation were discussed with the admitting physician, patient and/or family and they are comfortable with the plan.    Final Clinical Impressions(s) / ED Diagnoses   Final diagnoses:  SOB (shortness of breath)  Confusion  Weakness    ED Discharge Orders    None       Deno Etienne, DO 07/20/19 2329

## 2019-07-20 NOTE — ED Notes (Signed)
Patient transported to X-ray 

## 2019-07-20 NOTE — Telephone Encounter (Signed)
Pts wife called stating patient is more lethargic than usual.  He is on oxygen however does take it off periodically.  O2 level as low as 76% while off O2.  He gets back to 90 once replaced. She states she put him on his bipap and that has not helped to arouse him.  He did wake up momentarily to eat earlier today however spends majority of time sleeping and hard to arouse. D/w Dr [name], pt needs to be evaluated in ED to have labs drawn to check CO2 level.  Made wife aware of same and she verbalized understanding.

## 2019-07-21 ENCOUNTER — Observation Stay (HOSPITAL_COMMUNITY): Payer: PPO

## 2019-07-21 ENCOUNTER — Other Ambulatory Visit: Payer: Self-pay

## 2019-07-21 ENCOUNTER — Encounter (HOSPITAL_COMMUNITY): Payer: Self-pay

## 2019-07-21 DIAGNOSIS — G9340 Encephalopathy, unspecified: Secondary | ICD-10-CM

## 2019-07-21 DIAGNOSIS — K5900 Constipation, unspecified: Secondary | ICD-10-CM | POA: Diagnosis not present

## 2019-07-21 LAB — BASIC METABOLIC PANEL
Anion gap: 11 (ref 5–15)
BUN: 89 mg/dL — ABNORMAL HIGH (ref 8–23)
CO2: 30 mmol/L (ref 22–32)
Calcium: 9 mg/dL (ref 8.9–10.3)
Chloride: 97 mmol/L — ABNORMAL LOW (ref 98–111)
Creatinine, Ser: 1.78 mg/dL — ABNORMAL HIGH (ref 0.61–1.24)
GFR calc Af Amer: 43 mL/min — ABNORMAL LOW (ref >60)
GFR calc non Af Amer: 37 mL/min — ABNORMAL LOW (ref >60)
Glucose, Bld: 177 mg/dL — ABNORMAL HIGH (ref 70–99)
Potassium: 3.7 mmol/L (ref 3.5–5.1)
Sodium: 138 mmol/L (ref 135–145)

## 2019-07-21 LAB — GLUCOSE, CAPILLARY
Glucose-Capillary: 210 mg/dL — ABNORMAL HIGH (ref 70–99)
Glucose-Capillary: 162 mg/dL — ABNORMAL HIGH (ref 70–99)
Glucose-Capillary: 244 mg/dL — ABNORMAL HIGH (ref 70–99)
Glucose-Capillary: 238 mg/dL — ABNORMAL HIGH (ref 70–99)

## 2019-07-21 LAB — CBC
HCT: 41.6 % (ref 39.0–52.0)
Hemoglobin: 13 g/dL (ref 13.0–17.0)
MCH: 29 pg (ref 26.0–34.0)
MCHC: 31.3 g/dL (ref 30.0–36.0)
MCV: 92.7 fL (ref 80.0–100.0)
Platelets: 221 10*3/uL (ref 150–400)
RBC: 4.49 MIL/uL (ref 4.22–5.81)
RDW: 15.1 % (ref 11.5–15.5)
WBC: 8.8 10*3/uL (ref 4.0–10.5)
nRBC: 0 % (ref 0.0–0.2)

## 2019-07-21 LAB — PHOSPHORUS: Phosphorus: 5.6 mg/dL — ABNORMAL HIGH (ref 2.5–4.6)

## 2019-07-21 LAB — MAGNESIUM: Magnesium: 3.1 mg/dL — ABNORMAL HIGH (ref 1.7–2.4)

## 2019-07-21 MED ORDER — TORSEMIDE 20 MG PO TABS
80.0000 mg | ORAL_TABLET | Freq: Two times a day (BID) | ORAL | Status: DC
  Administered 2019-07-21 (×2): 80 mg via ORAL
  Filled 2019-07-21 (×2): qty 4

## 2019-07-21 MED ORDER — CEFDINIR 300 MG PO CAPS: 300.0000 mg | ORAL_CAPSULE | Freq: Two times a day (BID) | ORAL | 0 refills | Status: AC

## 2019-07-21 MED ORDER — SODIUM CHLORIDE 0.9% FLUSH: 3.0000 mL | INTRAVENOUS | Status: DC | PRN

## 2019-07-21 MED ORDER — BISACODYL 10 MG RE SUPP: 10.0000 mg | Freq: Every day | RECTAL | Status: DC | PRN

## 2019-07-21 MED ORDER — SODIUM CHLORIDE 0.9% FLUSH
3.0000 mL | Freq: Two times a day (BID) | INTRAVENOUS | Status: DC
  Administered 2019-07-21: 3 mL via INTRAVENOUS

## 2019-07-21 MED ORDER — POLYETHYLENE GLYCOL 3350 17 G PO PACK: 17.0000 g | PACK | Freq: Every day | ORAL | Status: DC | PRN

## 2019-07-21 MED ORDER — INSULIN ASPART 100 UNIT/ML ~~LOC~~ SOLN: 0.0000 [IU] | Freq: Every day | SUBCUTANEOUS | Status: DC

## 2019-07-21 MED ORDER — INSULIN ASPART 100 UNIT/ML ~~LOC~~ SOLN
0.0000 [IU] | Freq: Three times a day (TID) | SUBCUTANEOUS | Status: DC
  Administered 2019-07-21: 5 [IU] via SUBCUTANEOUS
  Administered 2019-07-21: 3 [IU] via SUBCUTANEOUS
  Administered 2019-07-21: 5 [IU] via SUBCUTANEOUS

## 2019-07-21 MED ORDER — ACETAMINOPHEN 650 MG RE SUPP: 650.0000 mg | Freq: Four times a day (QID) | RECTAL | Status: DC | PRN

## 2019-07-21 MED ORDER — LEVOTHYROXINE SODIUM 75 MCG PO TABS
175.0000 ug | ORAL_TABLET | Freq: Every day | ORAL | Status: DC
  Administered 2019-07-21: 175 ug via ORAL
  Filled 2019-07-21: qty 1

## 2019-07-21 MED ORDER — PRAMIPEXOLE DIHYDROCHLORIDE 1 MG PO TABS
1.0000 mg | ORAL_TABLET | Freq: Every day | ORAL | Status: DC
  Filled 2019-07-21: qty 1

## 2019-07-21 MED ORDER — ONDANSETRON HCL 4 MG PO TABS: 4.0000 mg | ORAL_TABLET | Freq: Four times a day (QID) | ORAL | Status: DC | PRN

## 2019-07-21 MED ORDER — ALBUTEROL SULFATE (2.5 MG/3ML) 0.083% IN NEBU: 2.5000 mg | INHALATION_SOLUTION | RESPIRATORY_TRACT | Status: DC | PRN

## 2019-07-21 MED ORDER — SODIUM CHLORIDE 0.9 % IV SOLN
1.0000 g | INTRAVENOUS | Status: DC
  Administered 2019-07-21: 1 g via INTRAVENOUS
  Filled 2019-07-21: qty 10

## 2019-07-21 MED ORDER — INSULIN GLARGINE 100 UNIT/ML ~~LOC~~ SOLN
60.0000 [IU] | Freq: Every day | SUBCUTANEOUS | Status: DC
  Filled 2019-07-21: qty 0.6

## 2019-07-21 MED ORDER — ACETAMINOPHEN 325 MG PO TABS: 650.0000 mg | ORAL_TABLET | Freq: Four times a day (QID) | ORAL | Status: DC | PRN

## 2019-07-21 MED ORDER — SPIRONOLACTONE 25 MG PO TABS
25.0000 mg | ORAL_TABLET | Freq: Every day | ORAL | Status: DC
  Administered 2019-07-21: 25 mg via ORAL
  Filled 2019-07-21: qty 1

## 2019-07-21 MED ORDER — ONDANSETRON HCL 4 MG/2ML IJ SOLN: 4.0000 mg | Freq: Four times a day (QID) | INTRAMUSCULAR | Status: DC | PRN

## 2019-07-21 MED ORDER — ATORVASTATIN CALCIUM 10 MG PO TABS
20.0000 mg | ORAL_TABLET | Freq: Every evening | ORAL | Status: DC
  Administered 2019-07-21: 18:00:00 20 mg via ORAL
  Filled 2019-07-21: qty 2

## 2019-07-21 MED ORDER — ASPIRIN EC 81 MG PO TBEC
81.0000 mg | DELAYED_RELEASE_TABLET | Freq: Every day | ORAL | Status: DC
  Administered 2019-07-21: 81 mg via ORAL
  Filled 2019-07-21: qty 1

## 2019-07-21 MED ORDER — SODIUM CHLORIDE 0.9 % IV SOLN: 250.0000 mL | INTRAVENOUS | Status: DC | PRN

## 2019-07-21 MED ORDER — PRAMIPEXOLE DIHYDROCHLORIDE 0.25 MG PO TABS
0.5000 mg | ORAL_TABLET | Freq: Every day | ORAL | Status: DC
  Administered 2019-07-21: 0.5 mg via ORAL
  Filled 2019-07-21: qty 2

## 2019-07-21 MED ORDER — PRAMIPEXOLE DIHYDROCHLORIDE 0.25 MG PO TABS: 0.5000 mg | ORAL_TABLET | Freq: Two times a day (BID) | ORAL | Status: DC

## 2019-07-21 MED ORDER — FESOTERODINE FUMARATE ER 8 MG PO TB24
8.0000 mg | ORAL_TABLET | Freq: Every day | ORAL | Status: DC
  Administered 2019-07-21: 8 mg via ORAL
  Filled 2019-07-21: qty 1

## 2019-07-21 MED ORDER — HYDRALAZINE HCL 25 MG PO TABS
25.0000 mg | ORAL_TABLET | Freq: Three times a day (TID) | ORAL | Status: DC
  Administered 2019-07-21 (×2): 25 mg via ORAL
  Filled 2019-07-21 (×2): qty 1

## 2019-07-21 MED ORDER — TAMSULOSIN HCL 0.4 MG PO CAPS: 0.4000 mg | ORAL_CAPSULE | Freq: Every day | ORAL | Status: DC

## 2019-07-21 MED ORDER — HYDROCODONE-ACETAMINOPHEN 5-325 MG PO TABS: 1.0000 | ORAL_TABLET | ORAL | Status: DC | PRN

## 2019-07-21 NOTE — Care Management Obs Status (Signed)
[facility] NOTIFICATION   Patient Details  Name: Travis [name] MRN: [id number] Date of Birth: Jun 19, 1946   Medicare Observation Status Notification Given:  Yes    Geralynn Ochs, LCSW 07/21/2019, 3:09 PM

## 2019-07-21 NOTE — Evaluation (Signed)
Physical Therapy Evaluation Patient Details Name: Travis [name] MRN: [id number] DOB: July 21, 1946 Today's Date: 07/21/2019   History of Present Illness  73 y.o. male with history of diastolic CHF last EF measured in May 2020 was 107 to 65% with history of diabetes mellitus type 2, sleep apnea, COPD, chronic kidney disease has been experiencing increasing shortness of breath lower extremity mild for the last 2 weeks despite increasing dose of metolazone.  Denies any chest pain productive cough fever chills.  Has been compliant with his medications. Admitted for CHF exacerbation.  Clinical Impression  Pt admitted with above diagnosis. Pt currently with functional limitations due to the deficits listed below (see PT Problem List). At the time of PT eval pt was able to perform transfers with up to +2 max assist from a significantly elevated bed height. Unable to clear hips with the RW however was able to achieve stand with Stedy. Pt would benefit from continued rehab at the SNF level, however pt reports "I'm not going to a rest home." At this time pt is at a high risk for falls, injury, and injury to family attempting to physically assist him at home. If pt refuses SNF, recommend ambulance transfer home, as pt is not safe to attempt car transfer at this time. Acutely, pt will benefit from skilled PT to increase their independence and safety with mobility to allow discharge to the venue listed below.       Follow Up Recommendations SNF;Supervision for mobility/OOB    Equipment Recommendations  None recommended by PT    Recommendations for Other Services       Precautions / Restrictions Precautions Precautions: Fall Restrictions Weight Bearing Restrictions: No      Mobility  Bed Mobility Overal bed mobility: Needs Assistance Bed Mobility: Rolling;Sidelying to Sit Rolling: Supervision Sidelying to sit: Supervision       General bed mobility comments: Pt able to transition to EOB without  therapist assist. Pt required significant increased time and effort, as well as heavy use of rails.   Transfers Overall transfer level: Needs assistance Equipment used: Rolling walker (2 wheeled) Transfers: Sit to/from Stand Sit to Stand: Max assist;+2 physical assistance;From elevated surface         General transfer comment: Not able to clear hips from elevated bed with RW. +2 required for sit<>stand with [facility] for support.   Ambulation/Gait             General Gait Details: Unable  Financial trader Rankin (Stroke Patients Only)       Balance Overall balance assessment: Needs assistance Sitting-balance support: No upper extremity supported;Feet supported Sitting balance-Leahy Scale: Good Sitting balance - Comments: supervision EOB   Standing balance support: Bilateral upper extremity supported Standing balance-Leahy Scale: Poor Standing balance comment: requires UE support                             Pertinent Vitals/Pain Pain Assessment: No/denies pain    Home Living Family/patient expects to be discharged to:: Private residence Living Arrangements: Spouse/significant other Available Help at Discharge: Family;Available 24 hours/day;Personal care attendant Type of Home: House Home Access: Ramped entrance     Home Layout: One level Home Equipment: [place] - 2 wheels;Walker - 4 wheels;Cane - single point;Bedside commode;Wheelchair - manual;Shower seat - built in;Grab bars - tub/shower;Hospital bed;Other (comment);Grab bars - toilet Additional Comments: 2x/wk  aide for 1 hour.    Prior Function Level of Independence: Needs assistance   Gait / Transfers Assistance Needed: pt reports working with HHPT on powering up to standing and was able to ambulate household distances once in standing but hasn't been able to do so in over a week  ADL's / Homemaking Assistance Needed: Pt wife assists with ADL's including  bathing and dressing        Hand Dominance   Dominant Hand: Right    Extremity/Trunk Assessment   Upper Extremity Assessment Upper Extremity Assessment: Generalized weakness    Lower Extremity Assessment Lower Extremity Assessment: RLE deficits/detail;LLE deficits/detail RLE Deficits / Details: decreased knee and ankle ROM secondary to edema, UNNA boot applied, strength of quads and hips continues to be weak for power up from low surfaces RLE Sensation: decreased light touch RLE Coordination: decreased fine motor;decreased gross motor LLE Deficits / Details: decreased knee and ankle ROM secondary to edema, UNNA boot applied, strength of quads and hips continues to be weak for power up from low surfaces LLE Sensation: decreased light touch LLE Coordination: decreased fine motor;decreased gross motor    Cervical / Trunk Assessment Cervical / Trunk Assessment: Kyphotic  Communication   Communication: No difficulties  Cognition Arousal/Alertness: Awake/alert Behavior During Therapy: WFL for tasks assessed/performed Overall Cognitive Status: Within Functional Limits for tasks assessed                                        General Comments      Exercises     Assessment/Plan    PT Assessment Patient needs continued PT services  PT Problem List Decreased strength;Decreased range of motion;Decreased activity tolerance;Decreased balance;Decreased mobility;Decreased coordination;Cardiopulmonary status limiting activity;Impaired sensation;Pain       PT Treatment Interventions DME instruction;Gait training;Functional mobility training;Therapeutic activities;Therapeutic exercise;Balance training;Cognitive remediation;Patient/family education    PT Goals (Current goals can be found in the Care Plan section)  Acute Rehab PT Goals Patient Stated Goal: to get stronger PT Goal Formulation: With patient Time For Goal Achievement: 08/04/19 Potential to Achieve  Goals: Fair    Frequency Min 3X/week   Barriers to discharge        Co-evaluation               AM-PAC PT "6 Clicks" Mobility  Outcome Measure Help needed turning from your back to your side while in a flat bed without using bedrails?: A Little Help needed moving from lying on your back to sitting on the side of a flat bed without using bedrails?: A Little Help needed moving to and from a bed to a chair (including a wheelchair)?: Total Help needed standing up from a chair using your arms (e.g., wheelchair or bedside chair)?: Total Help needed to walk in hospital room?: Total Help needed climbing 3-5 steps with a railing? : Total 6 Click Score: 10    End of Session Equipment Utilized During Treatment: Gait belt;Oxygen Activity Tolerance: Patient tolerated treatment well Patient left: in chair;with call bell/phone within reach Nurse Communication: Mobility status PT Visit Diagnosis: Unsteadiness on feet (R26.81);Other abnormalities of gait and mobility (R26.89);Muscle weakness (generalized) (M62.81);Difficulty in walking, not elsewhere classified (R26.2)    Time: 1200-1230 PT Time Calculation (min) (ACUTE ONLY): 30 min   Charges:   PT Evaluation $PT Eval Moderate Complexity: 1 Mod PT Treatments $Therapeutic Activity: 8-22 mins        Mickel Baas  Rich Reining, PT, DPT Acute Rehabilitation Services Pager: [phone] Office: [place] 07/21/2019, 1:04 PM

## 2019-07-21 NOTE — TOC Initial Note (Signed)
Transition of Care [facility]) - Initial/Assessment Note    Patient Details  Name: Travis [name] MRN: [id number] Date of Birth: [birth year]  Transition [facility]) CM/SW Contact:    Geralynn Ochs, LCSW Phone Number: 07/21/2019, 3:14 PM  Clinical Narrative:   CSW met with patient to discuss recommendation for SNF. Patient refusing; says he will not go to rehab, he wants to go home. Patient agreeing to be set back up with [facility]. CSW alerted Tiffany that patient was returning home today. Patient will need PTAR home.                 Expected Discharge Plan: [place] Barriers to Discharge: Barriers Resolved   Patient Goals and CMS Choice Patient states their goals for this hospitalization and ongoing recovery are:: to get back home CMS Medicare.gov Compare Post Acute Care list provided to:: Patient Choice offered to / list presented to : Patient  Expected Discharge Plan and Services Expected Discharge Plan: [place] Choice: [place] arrangements for the past 2 months: Single Family Home Expected Discharge Date: 07/22/19                         HH Arranged: PT, OT   Date HH Agency Contacted: 07/21/19 Time HH Agency Contacted: 22 Representative spoke with at [place]: [place]  Prior Living Arrangements/Services Living arrangements for the past 2 months: [place] with:: Spouse Patient language and need for interpreter reviewed:: No Do you feel safe going back to the place where you live?: Yes      Need for Family Participation in Patient Care: No (Comment) Care giver support system in place?: Yes (comment) Current home services: Home OT, Home PT Criminal Activity/Legal Involvement Pertinent to Current Situation/Hospitalization: No - Comment as needed  Activities of Daily Living Home Assistive Devices/Equipment: BIPAP, Oxygen ADL Screening (condition at time of  admission) Patient's cognitive ability adequate to safely complete daily activities?: Yes Is the patient deaf or have difficulty hearing?: No Does the patient have difficulty seeing, even when wearing glasses/contacts?: No Does the patient have difficulty concentrating, remembering, or making decisions?: No Patient able to express need for assistance with ADLs?: Yes Does the patient have difficulty dressing or bathing?: Yes Independently performs ADLs?: No Communication: Independent Dressing (OT): Needs assistance Is this a change from baseline?: Pre-admission baseline Grooming: Needs assistance Is this a change from baseline?: Pre-admission baseline Feeding: Independent Bathing: Needs assistance Is this a change from baseline?: Pre-admission baseline Toileting: Needs assistance Is this a change from baseline?: Pre-admission baseline In/Out Bed: Dependent Is this a change from baseline?: Pre-admission baseline Walks in Home: Independent with device (comment) Does the patient have difficulty walking or climbing stairs?: Yes Weakness of Legs: Both Weakness of Arms/Hands: Both  Permission Sought/Granted                  Emotional Assessment Appearance:: Appears stated age Attitude/Demeanor/Rapport: Engaged, Lethargic Affect (typically observed): Appropriate Orientation: : Oriented to Self, Oriented to  Time, Oriented to Place, Oriented to Situation Alcohol / Substance Use: Not Applicable Psych Involvement: No (comment)  Admission diagnosis:  Confusion [R41.0] Weakness [R53.1] SOB (shortness of breath) [R06.02] Obstipation [K59.00] Constipation [K59.00] Patient Active Problem List   Diagnosis Date Noted  . Acute metabolic encephalopathy 96/29/5284  . Acute CHF (congestive heart failure) ([place]) 07/13/2019  . Anasarca 05/01/2019  .  Cellulitis of lower extremity 05/01/2019  . COPD (chronic obstructive pulmonary disease) ([place]) 05/01/2019  . Depression 05/01/2019  . RLS  (restless legs syndrome) 05/01/2019  . Type 2 diabetes mellitus with stage 3 chronic kidney disease, with long-term current use of insulin ([place]) 04/15/2019  . Insulin dependent diabetes mellitus ([place]) 04/15/2019  . Pressure injury of skin 11/21/2018  . Complicated urinary tract infection 11/20/2018  . Acute encephalopathy 06/28/2018  . Oxygen desaturation   . Supplemental oxygen dependent   . Type 2 diabetes mellitus with hyperglycemia, with long-term current use of insulin ([place])   . Labile blood pressure   . Labile blood glucose   . Myelopathy ([place]) 04/16/2018  . Benign essential HTN   . Benign prostatic hyperplasia with urinary retention   . Acute on chronic respiratory failure with hypoxia and hypercapnia (HCC)   . DDD (degenerative disc disease), cervical   . Obesity, Class III, BMI 40-49.9 (morbid obesity) ([place])   . S/P lumbar spinal fusion 04/08/2018  . Paraparesis of both lower limbs ([place]) 11/11/2017  . Myelopathy, spondylogenic, cervical 11/11/2017  . Excessive daytime sleepiness 11/11/2017  . 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 Debility 10/08/2017  . Stage 3 chronic kidney disease ([place])   . Spinal stenosis of lumbar region   . Acute blood loss anemia   . Urinary tract infection without hematuria   . Acute on chronic diastolic CHF (congestive heart failure) ([place])   . Elevated troponin I level   . Hypothyroid 08/19/2015  . Restless legs syndrome 03/14/2015  . Shortness of breath 03/09/2015  . Sinus bradycardia 09/21/2014  . Thoracic spondylosis with myelopathy 09/13/2014  . Cervical arthritis with myelopathy 07/18/2014  . Constipation - functional   . Sepsis ([place]) 07/10/2014  . OSA on CPAP 07/10/2014  . Edema leg 07/10/2014  . Lumbosacral spinal stenosis 03/13/2014  . Other vitamin B12 deficiency anemia 07/13/2013  . 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 Polyneuropathy in other diseases classified  elsewhere ([place]) 02/17/2013  . Other general symptoms(780.99) 02/17/2013  . Abnormality of gait 02/17/2013  . Cervical spondylosis with myelopathy 02/17/2013  . Back ache 09/15/2012   PCP:  [name], MD Pharmacy:   [place], [place] - [address] [place] [address] Phone: [phone] Fax: [phone]  [place], [place] [address] Phone: [phone] Fax: [phone]     Social Determinants of Health (SDOH) Interventions    Readmission Risk Interventions Readmission Risk Prevention Plan 07/18/2019  Transportation Screening Complete  PCP or Specialist Appt within 3-5 Days Complete  HRI or [place] Complete  Social Work Consult for [place] Planning/Counseling Complete  Palliative Care Screening Not Applicable  Medication Review Press photographer) Complete  Some recent data might be hidden

## 2019-07-21 NOTE — Progress Notes (Signed)
Nutrition Brief Note  RD consulted to assess pt for malnutrition.  Spoke with pt at bedside. Noted 100% completed lunch meal tray at bedside.  Pt denies any issues eating or any changes in his appetite. Pt states that he eats 2-3 meals daily (starch, meat, vegetable). Pt denies any recent weight loss and reports his UBW as 288 lbs.  Albumin has a half-life of 21 days and is strongly affected by stress response and inflammatory process, therefore, do not expect to see an improvement in this lab value during acute hospitalization.  Wt Readings from Last 15 Encounters:  07/20/19 129.3 kg  07/18/19 129.3 kg  06/10/19 129.3 kg  05/11/19 127.7 kg  04/15/19 129.7 kg  03/04/19 126.1 kg  03/02/19 125.8 kg  02/04/19 125.6 kg  02/03/19 126.3 kg  01/05/19 122.9 kg  12/31/18 122.6 kg  12/03/18 121.1 kg  12/01/18 120.2 kg  11/29/18 114.2 kg  11/21/18 114.2 kg    Body mass index is 37.6 kg/m. Patient meets criteria for obesity class II based on current BMI.   Current diet order is Carb Modified, patient is consuming approximately 100% of meals at this time. Labs and medications reviewed.   No nutrition interventions warranted at this time. If nutrition issues arise, please consult RD.    Gaynell Face, MS, RD, LDN Inpatient Clinical Dietitian Pager: [phone] Weekend/After Hours: [phone]

## 2019-07-21 NOTE — Progress Notes (Signed)
Patient discharge form the hospital. Left in care with son and daughter

## 2019-07-21 NOTE — Discharge Summary (Signed)
Physician Discharge Summary  DIMITRI SHAKESPEARE KAJ:[id number] DOB: 26-Jan-1946 DOA: 07/20/2019  PCP: [name], MD  Admit date: 07/20/2019 Discharge date: 07/21/2019  Admitted From: Home Disposition:  Home  Discharge Condition:Stable CODE STATUS:FULL Diet recommendation: Heart Healthy   Brief/Interim Summary:  HPI (Dr. Roel Cluck) : Travis [name] is a 73 y.o. male with medical history significant of diastolic CHF,  Constipation, mild COPD, degenerative disc disease.  Chronic oxygen requirement on 2.5 L at all times, peripheral neuropathy, OSA on BiPAP, diabetes mellitus type 2, Presented with feeling more lethargic than usual oxygen noted to be as low 76% while off O2 because he sometimes takes it off although supposed to be in 2.5 L he got a back after 90s when but better on oxygen Wife attempted to use his BiPAP to see if it will wake him up but it did not seem to help He is very hard to arouse at home. He would fall asleep while talking to his wife She called her cardiologist Dr. [name] who recommended sending patient to emergency department to evaluate for CO2 retention Was he have not had any chest pain no hemoptysis no cough no fevers He has been very weak since he came home. At his baseline he walks a bit with a walker now he can't pull himself up He has been constipated have not had a BM in 2-3 days He has been eating a bit less  Recently admitted to the hospital with dyspnea and lower extremity edema initially was noted to be hypoxic requiring 6 L's he was diuresed with IV Lasix requiring continuous drip and spironolactone metolazone and acetazolamide Was able to diurese about 4.7 L and improve his symptoms he was able to be discharged home 2 days ago on 20 July on 80 mg of torsemide spironolactone twice weekly metolazone up from prior once a week While hospitalized patient undergone right heart catheterization Showing mild pulmonary hypertension  Hospital  Course:  His hospital course remained stable.  This morning he was alert and oriented x4.  Hemodynamically stable.  He is back on his baseline oxygen requirement.  Chest x-ray done on admission did not show pneumonia.  UA was suggestive of urinary tract infection but he denies any dysuria.  After discussion with wife, we found that he has history of frequent urinary tract infection.  Given a dose of  ceftriaxone.  Urine culture sent, report is pending.  Antibiotics changed to oral on discharge today.  He had 2 large bowel movements this morning.  Kidney function improved today.  He was seen by PT/OT and recommended skilled nursing facility/CIR but as before he denied and wants to go home.  Patient will be discharged to home with home health.  Following problems were addressed during his hospitalization:  Acute metabolic encephalopathy -likely in the setting of significant recent diuresis chronic compensated hypercarbia and hypoxia.     This morning he was alert and oriented x4.  Hemodynamically stable.         He is back on his baseline oxygen requirement.  Chest x-ray done on         admission did not show pneumonia.     . Acute on chronic respiratory failure with hypoxia and hypercapnia (HCC) -close to baseline continue cpap at night and oxygen throughout the day  . Restless legs syndrome- stable continue home medications  . Hypothyroid -- - TSH recently checked, continue home medications at current dose  . Stage 3 chronic kidney disease ([place])- avoid  nephrotoxic medications ,currently creatinine around baseline  . Excessive daytime sleepiness - VBG at baseline  . Benign essential HTN- stable continue home medications  . COPD - stable currently no evidence of COPD exacerbation ,no cough or wheezing  . Debility - He was seen by PT/OT and recommended skilled nursing facility/CIR but as before he denied and wants to go home.  Patient will be discharged to home with home health.  .  Constipation -2 large bowel movements today.       . DM 2-  - resume home regimen      Discharge Diagnoses:  Active Problems:   OSA on CPAP   Restless legs syndrome   Hypothyroid   Stage 3 chronic kidney disease (HCC)   Debility   Restrictive lung disease   Excessive daytime sleepiness   Acute on chronic respiratory failure with hypoxia and hypercapnia (HCC)   Benign essential HTN   Type 2 diabetes mellitus with hyperglycemia, with long-term current use of insulin (HCC)   Acute encephalopathy   COPD (chronic obstructive pulmonary disease) (HCC)   Acute metabolic encephalopathy    Discharge Instructions  Discharge Instructions    Diet - low sodium heart healthy   Complete by: As directed    Discharge instructions   Complete by: As directed    1)Follow up with your PCP in a week. 2)Continue your home medicines. 3)Follow up with Home Health services.   Increase activity slowly   Complete by: As directed      Allergies as of 07/21/2019      Reactions   Amlodipine Swelling, Other (See Comments)   Causes edema (per [facility] paperwork)   Plendil [felodipine] Swelling, Other (See Comments)   Causes edema (per [facility] paperwork)   Compazine [prochlorperazine] Other (See Comments)   Caused agitation (per [facility] paperwork)   Cymbalta [duloxetine Hcl] Other (See Comments)   Made the patient "feel weird," per [facility] paperwork   Jardiance [empagliflozin] Other (See Comments)   Stopped by MD because of unwanted side effects   Other Nausea Only, Other (See Comments)   ANESTHESIA UNSPECIFIED- makes sick, must have anti-nausea medication in advance   Levofloxacin In D5w Itching   Possible infusion reaction 03/29/17. Tolerating oral Levaquin 03/30/17   Septra [sulfamethoxazole-trimethoprim] Nausea Only, Other (See Comments)   Per [facility] paperwork      Medication List    TAKE these medications   aspirin EC 81 MG tablet Take 81 mg by  mouth daily.   atorvastatin 20 MG tablet Commonly known as: LIPITOR Take 20 mg by mouth every evening.   cefdinir 300 MG capsule Commonly known as: OMNICEF Take 1 capsule (300 mg total) by mouth 2 (two) times daily for 4 days. Start taking on: July 22, 2019   escitalopram 20 MG tablet Commonly known as: LEXAPRO Take 20 mg by mouth daily.   FreeStyle Libre 14 Day Reader Kerrin Mo 1 kit by Does not apply route 3 (three) times daily.   FreeStyle Libre 14 Day Sensor Misc 1 kit by Does not apply route 3 (three) times daily.   gabapentin 400 MG capsule Commonly known as: NEURONTIN Take 400 mg by mouth 3 (three) times daily.   glucose blood test strip Use as instructed to test blood sugars 4 times daily DX E11.42   hydrALAZINE 25 MG tablet Commonly known as: APRESOLINE Take 25 mg by mouth 3 (three) times daily.   insulin aspart 100 UNIT/ML FlexPen Commonly known as: NovoLOG FlexPen  Inject 18 Units into the skin 3 (three) times daily with meals.   Insulin Glargine 100 UNIT/ML Solostar Pen Commonly known as: Lantus SoloStar Inject 60 Units into the skin at bedtime.   Insulin Pen Needle 32G X 6 MM Misc Commonly known as: BD Pen Needle Micro U/F Four times daily   Insulin Pen Needle 32G X 6 MM Misc Commonly known as: BD Pen Needle Micro U/F 4 times a day   levothyroxine 175 MCG tablet Commonly known as: SYNTHROID Take 1 tablet (175 mcg total) by mouth daily.   metolazone 2.5 MG tablet Commonly known as: ZAROXOLYN Take 1 tablet (2.5 mg total) by mouth 2 (two) times a week. Monday AND FRIDAY   multivitamin Tabs tablet Take 1 tablet by mouth at bedtime.   onetouch ultrasoft lancets Use as instructed to test blood sugars 4 times daily DX E11.42   OneTouch Verio w/Device Kit 1 kit by Does not apply route 4 (four) times daily. Use as directed to test blood sugars 4 times daily DX E11.42   OXYGEN Inhale 2-3 Units into the lungs at bedtime.   polyethylene glycol 17 g  packet Commonly known as: MIRALAX / GLYCOLAX Take 17 g by mouth daily as needed. For constipation What changed:   when to take this  additional instructions   potassium chloride SA 20 MEQ tablet Commonly known as: K-DUR Take 1 tablet (20 mEq total) by mouth 2 (two) times daily. Take 1 tab only on days you take Metolazone   pramipexole 0.5 MG tablet Commonly known as: MIRAPEX One tablet in the morning and 2 in the evening What changed:   how much to take  how to take this  when to take this  additional instructions   spironolactone 25 MG tablet Commonly known as: ALDACTONE Take 1 tablet (25 mg total) by mouth daily.   tamsulosin 0.4 MG Caps capsule Commonly known as: FLOMAX Take 1 capsule (0.4 mg total) by mouth daily. What changed: when to take this   torsemide 20 MG tablet Commonly known as: DEMADEX Take 4 tablets (80 mg total) by mouth 2 (two) times daily.   Toviaz 8 MG Tb24 tablet Generic drug: fesoterodine Take 1 tablet (8 mg total) by mouth daily.      Follow-up Information    [name], MD. Schedule an appointment as soon as possible for a visit in 1 week(s).   Specialty: Family Medicine Contact information: [address], [place] [address] [phone]        Nahser, Wonda Cheng, MD .   Specialty: Cardiology Contact information: [place] [address] [address] [phone]        [facility] Follow up.   Specialty: Home Health Services Why: Representative from [facility] will contact you via phone to schedule continuation of home health services. Contact information: [address] [place] [address] [phone]          Allergies  Allergen Reactions  . Amlodipine Swelling and Other (See Comments)    Causes edema (per [facility] paperwork)  . Plendil [Felodipine] Swelling and Other (See Comments)    Causes edema (per [facility] paperwork)  . Compazine  [Prochlorperazine] Other (See Comments)    Caused agitation (per [facility] paperwork)  . Cymbalta [Duloxetine Hcl] Other (See Comments)    Made the patient "feel weird," per [facility] paperwork  . Jardiance [Empagliflozin] Other (See Comments)    Stopped  by MD because of unwanted side effects  . Other Nausea Only and Other (See Comments)    ANESTHESIA UNSPECIFIED- makes sick, must have anti-nausea medication in advance  . Levofloxacin In D5w Itching    Possible infusion reaction 03/29/17. Tolerating oral Levaquin 03/30/17  . Septra [Sulfamethoxazole-Trimethoprim] Nausea Only and Other (See Comments)    Per [facility] paperwork    Consultations:  None   Procedures/Studies: Dg Abd 1 View  Result Date: 07/21/2019 CLINICAL DATA:  F/u eval for obstipation-pt's abd is very distended - pt has no complaints of abd pain, states he is having pain in his upper back between shoulder blades EXAM: ABDOMEN - 1 VIEW COMPARISON:  07/20/2019 FINDINGS: Bowel gas pattern is nonobstructive. There is moderate stool throughout nondilated loops of colon, including the cecum. Status post thoracic and lumbar fusion. Surgical clips are identified in the RIGHT UPPER QUADRANT the abdomen. IMPRESSION: Moderate stool burden. Electronically Signed   By: Nolon Nations M.D.   On: 07/21/2019 13:11   Dg Abd 1 View  Result Date: 07/20/2019 CLINICAL DATA:  Constipation for the past 2-3 days. EXAM: ABDOMEN - 1 VIEW COMPARISON:  CT abdomen pelvis dated November 29, 2018. FINDINGS: Unchanged prominent gaseous distention of the stomach. The bowel gas pattern is normal. Large amount of stool throughout the colon. No radio-opaque calculi or other significant radiographic abnormality are seen. No acute osseous abnormality. IMPRESSION: Large colonic stool burden. Electronically Signed   By: Titus Dubin M.D.   On: 07/20/2019 21:54   Ct Head Wo Contrast  Result Date: 07/21/2019 CLINICAL DATA:  73 year old male  with history of diabetes presenting with encephalopathy. EXAM: CT HEAD WITHOUT CONTRAST TECHNIQUE: Contiguous axial images were obtained from the base of the skull through the vertex without intravenous contrast. COMPARISON:  Head CT dated 06/28/2018 FINDINGS: Brain: Mild age-related atrophy and chronic microvascular ischemic changes. There is no acute intracranial hemorrhage. No mass effect or midline shift. No extra-axial fluid collection. Vascular: High attenuating cerebral arteries, likely related to hemoconcentration/dehydration. Skull: Normal. Negative for fracture or focal lesion. Sinuses/Orbits: Small bilateral maxillary sinus retention cysts or polyps. No air-fluid level. The mastoid air cells are clear. Other: None IMPRESSION: 1. No acute intracranial hemorrhage. 2. Mild age-related atrophy and chronic microvascular ischemic changes. Electronically Signed   By: Anner Crete M.D.   On: 07/21/2019 00:41   Dg Chest Port 1 View  Result Date: 07/20/2019 CLINICAL DATA:  Fatigue and shortness of breath. Recently discharged for CHF exacerbation. EXAM: PORTABLE CHEST 1 VIEW COMPARISON:  Chest x-ray dated July 13, 2019. FINDINGS: Stable cardiomediastinal silhouette partially obscured by elevated hemidiaphragms. Atherosclerotic calcification of the aortic arch. Normal pulmonary vascularity. Persistent low lung volumes with bibasilar atelectasis. No focal consolidation, pleural effusion, or pneumothorax. No acute osseous abnormality. IMPRESSION: 1. Persistent low lung volumes with bibasilar atelectasis. Electronically Signed   By: Titus Dubin M.D.   On: 07/20/2019 19:12   Dg Chest Port 1 View  Result Date: 07/13/2019 CLINICAL DATA:  73 y/o M; 2 days of shortness of breath. Patient denies chest pain. Former smoker. EXAM: PORTABLE CHEST 1 VIEW COMPARISON:  05/01/2019 chest radiograph. FINDINGS: Stable cardiac silhouette partially obscured by elevated hemidiaphragms. Aortic atherosclerosis with  calcification. Low lung volumes accentuate pulmonary markings. Platelike atelectasis in the lung bases. No visible effusion. No pneumothorax. No acute osseous abnormality is evident. Midthoracic fusion hardware noted. IMPRESSION: Low lung volumes. Platelike atelectasis in the lung bases. Aortic atherosclerosis. Electronically Signed   By: Edgardo Roys.D.  On: 07/13/2019 19:34   [place] Ekg Site Rite  Result Date: 07/17/2019 If Site Rite image not attached, placement could not be confirmed due to current cardiac rhythm.      Subjective:  Patient seen and examined at bedside this morning.  Hemodynamically stable.  Alert and oriented.  Stable for discharge to home today.  Discharge Exam: Vitals:   07/21/19 0726 07/21/19 1510  BP: 125/61 130/63  Pulse: 76 71  Resp: 16   Temp: 98 F (36.7 C)   SpO2: 94% 97%   Vitals:   07/21/19 0138 07/21/19 0300 07/21/19 0726 07/21/19 1510  BP: 125/62 134/60 125/61 130/63  Pulse: 76 84 76 71  Resp: (!) 29 (!) 26 16   Temp:  97.7 F (36.5 C) 98 F (36.7 C)   TempSrc:  Axillary Oral   SpO2: 91% 99% 94% 97%  Weight:      Height:        General: Pt is alert, awake, not in acute distress Cardiovascular: RRR, S1/S2 +, no rubs, no gallops Respiratory: CTA bilaterally, no wheezing, no rhonchi Abdominal: Soft, NT, ND, bowel sounds + Extremities: Trace bilateral lower extremity edema, no cyanosis    The results of significant diagnostics from this hospitalization (including imaging, microbiology, ancillary and laboratory) are listed below for reference.     Microbiology: Recent Results (from the past 240 hour(s))  SARS Coronavirus 2 (CEPHEID - Performed in [place] hospital lab), Hosp Order     Status: None   Collection Time: 07/13/19  9:00 PM   Specimen: Nasopharyngeal Swab  Result Value Ref Range Status   SARS Coronavirus 2 NEGATIVE NEGATIVE Final    Comment: (NOTE) If result is NEGATIVE SARS-CoV-2 target nucleic acids are  NOT DETECTED. The SARS-CoV-2 RNA is generally detectable in upper and lower  respiratory specimens during the acute phase of infection. The lowest  concentration of SARS-CoV-2 viral copies this assay can detect is 250  copies / mL. A negative result does not preclude SARS-CoV-2 infection  and should not be used as the sole basis for treatment or other  patient management decisions.  A negative result may occur with  improper specimen collection / handling, submission of specimen other  than nasopharyngeal swab, presence of viral mutation(s) within the  areas targeted by this assay, and inadequate number of viral copies  (<250 copies / mL). A negative result must be combined with clinical  observations, patient history, and epidemiological information. If result is POSITIVE SARS-CoV-2 target nucleic acids are DETECTED. The SARS-CoV-2 RNA is generally detectable in upper and lower  respiratory specimens dur ing the acute phase of infection.  Positive  results are indicative of active infection with SARS-CoV-2.  Clinical  correlation with patient history and other diagnostic information is  necessary to determine patient infection status.  Positive results do  not rule out bacterial infection or co-infection with other viruses. If result is PRESUMPTIVE POSTIVE SARS-CoV-2 nucleic acids MAY BE PRESENT.   A presumptive positive result was obtained on the submitted specimen  and confirmed on repeat testing.  While 2019 novel coronavirus  (SARS-CoV-2) nucleic acids may be present in the submitted sample  additional confirmatory testing may be necessary for epidemiological  and / or clinical management purposes  to differentiate between  SARS-CoV-2 and other Sarbecovirus currently known to infect humans.  If clinically indicated additional testing with an alternate test  methodology [phone]) is advised. The SARS-CoV-2 RNA is generally  detectable in upper and lower respiratory sp  ecimens  during the acute  phase of infection. The expected result is Negative. Fact Sheet for Patients:  StrictlyIdeas.no Fact Sheet for Healthcare Providers: BankingDealers.co.za This test is not yet approved or cleared by the [place] FDA and has been authorized for detection and/or diagnosis of SARS-CoV-2 by FDA under an Emergency Use Authorization (EUA).  This EUA will remain in effect (meaning this test can be used) for the duration of the COVID-19 declaration under Section 564(b)(1) of the Act, 21 U.S.C. section 360bbb-3(b)(1), unless the authorization is terminated or revoked sooner. Performed at [facility], [place] [address], [place], [place] [address]   SARS Coronavirus 2 (CEPHEID - Performed in [place] hospital lab), Hosp Order     Status: None   Collection Time: 07/20/19  8:13 PM   Specimen: Nasopharyngeal Swab  Result Value Ref Range Status   SARS Coronavirus 2 NEGATIVE NEGATIVE Final    Comment: (NOTE) If result is NEGATIVE SARS-CoV-2 target nucleic acids are NOT DETECTED. The SARS-CoV-2 RNA is generally detectable in upper and lower  respiratory specimens during the acute phase of infection. The lowest  concentration of SARS-CoV-2 viral copies this assay can detect is 250  copies / mL. A negative result does not preclude SARS-CoV-2 infection  and should not be used as the sole basis for treatment or other  patient management decisions.  A negative result may occur with  improper specimen collection / handling, submission of specimen other  than nasopharyngeal swab, presence of viral mutation(s) within the  areas targeted by this assay, and inadequate number of viral copies  (<250 copies / mL). A negative result must be combined with clinical  observations, patient history, and epidemiological information. If result is POSITIVE SARS-CoV-2 target nucleic acids are DETECTED. The SARS-CoV-2 RNA is generally detectable  in upper and lower  respiratory specimens dur ing the acute phase of infection.  Positive  results are indicative of active infection with SARS-CoV-2.  Clinical  correlation with patient history and other diagnostic information is  necessary to determine patient infection status.  Positive results do  not rule out bacterial infection or co-infection with other viruses. If result is PRESUMPTIVE POSTIVE SARS-CoV-2 nucleic acids MAY BE PRESENT.   A presumptive positive result was obtained on the submitted specimen  and confirmed on repeat testing.  While 2019 novel coronavirus  (SARS-CoV-2) nucleic acids may be present in the submitted sample  additional confirmatory testing may be necessary for epidemiological  and / or clinical management purposes  to differentiate between  SARS-CoV-2 and other Sarbecovirus currently known to infect humans.  If clinically indicated additional testing with an alternate test  methodology [phone]) is advised. The SARS-CoV-2 RNA is generally  detectable in upper and lower respiratory sp ecimens during the acute  phase of infection. The expected result is Negative. Fact Sheet for Patients:  StrictlyIdeas.no Fact Sheet for Healthcare Providers: BankingDealers.co.za This test is not yet approved or cleared by the [place] FDA and has been authorized for detection and/or diagnosis of SARS-CoV-2 by FDA under an Emergency Use Authorization (EUA).  This EUA will remain in effect (meaning this test can be used) for the duration of the COVID-19 declaration under Section 564(b)(1) of the Act, 21 U.S.C. section 360bbb-3(b)(1), unless the authorization is terminated or revoked sooner. Performed at [facility], [place] [address] [place], [place] [address]      Labs: BNP (last 3 results) Recent Labs    05/01/19 0123 07/13/19 1845 07/20/19  1745  BNP 87.6 45.7 11.9   Basic Metabolic Panel: Recent Labs   Lab 07/16/19 0334 07/17/19 0748 07/18/19 0403 07/18/19 0816 07/18/19 0854 07/20/19 1628 07/20/19 1801 07/21/19 0341 07/21/19 0949  NA 136 135 132* 137  135  --  134* 137  --  138  K 3.7 4.0 3.6 3.2*  3.4*  --  3.8 3.7  --  3.7  CL 85* 85* 86*  --   --  92*  --   --  97*  CO2 36* 35* 33*  --   --  28  --   --  30  GLUCOSE 215* 225* 197*  --   --  287*  --   --  177*  BUN 75* 85* 92*  --   --  96*  --   --  89*  CREATININE 1.97* 2.01* 2.07*  --  2.09* 2.18*  --   --  1.78*  CALCIUM 9.5 9.8 9.2  --   --  8.9  --   --  9.0  MG 2.6*  --   --   --   --   --   --  3.1*  --   PHOS  --   --   --   --   --   --   --  5.6*  --    Liver Function Tests: No results for input(s): AST, ALT, ALKPHOS, BILITOT, PROT, ALBUMIN in the last 168 hours. No results for input(s): LIPASE, AMYLASE in the last 168 hours. No results for input(s): AMMONIA in the last 168 hours. CBC: Recent Labs  Lab 07/18/19 0816 07/18/19 0854 07/20/19 1628 07/20/19 1801 07/21/19 0341  WBC  --  7.7 7.3  --  8.8  HGB 13.3  13.6 12.8* 13.0 12.6* 13.0  HCT 39.0  40.0 39.8 41.0 37.0* 41.6  MCV  --  91.1 93.0  --  92.7  PLT  --  227 227  --  221   Cardiac Enzymes: No results for input(s): CKTOTAL, CKMB, CKMBINDEX, TROPONINI in the last 168 hours. BNP: Invalid input(s): POCBNP CBG: Recent Labs  Lab 07/18/19 0916 07/20/19 2332 07/21/19 0313 07/21/19 0612 07/21/19 1150  GLUCAP 181* 207* 210* 162* 244*   D-Dimer Recent Labs    07/20/19 2113  DDIMER 0.62*   Hgb A1c No results for input(s): HGBA1C in the last 72 hours. Lipid Profile No results for input(s): CHOL, HDL, LDLCALC, TRIG, CHOLHDL, LDLDIRECT in the last 72 hours. Thyroid function studies No results for input(s): TSH, T4TOTAL, T3FREE, THYROIDAB in the last 72 hours.  Invalid input(s): FREET3 Anemia work up No results for input(s): VITAMINB12, FOLATE, FERRITIN, TIBC, IRON, RETICCTPCT in the last 72 hours. Urinalysis    Component Value  Date/Time   COLORURINE YELLOW 07/20/2019 1728   APPEARANCEUR CLEAR 07/20/2019 1728   LABSPEC 1.010 07/20/2019 1728   PHURINE 5.0 07/20/2019 1728   GLUCOSEU NEGATIVE 07/20/2019 1728   HGBUR NEGATIVE 07/20/2019 1728   BILIRUBINUR NEGATIVE 07/20/2019 1728   KETONESUR NEGATIVE 07/20/2019 1728   PROTEINUR NEGATIVE 07/20/2019 1728   UROBILINOGEN 0.2 08/22/2015 1820   NITRITE NEGATIVE 07/20/2019 1728   LEUKOCYTESUR LARGE (A) 07/20/2019 1728   Sepsis Labs Invalid input(s): PROCALCITONIN,  WBC,  LACTICIDVEN Microbiology Recent Results (from the past 240 hour(s))  SARS Coronavirus 2 (CEPHEID - Performed in [place] hospital lab), Hosp Order     Status: None   Collection Time: 07/13/19  9:00 PM   Specimen: Nasopharyngeal Swab  Result Value  Ref Range Status   SARS Coronavirus 2 NEGATIVE NEGATIVE Final    Comment: (NOTE) If result is NEGATIVE SARS-CoV-2 target nucleic acids are NOT DETECTED. The SARS-CoV-2 RNA is generally detectable in upper and lower  respiratory specimens during the acute phase of infection. The lowest  concentration of SARS-CoV-2 viral copies this assay can detect is 250  copies / mL. A negative result does not preclude SARS-CoV-2 infection  and should not be used as the sole basis for treatment or other  patient management decisions.  A negative result may occur with  improper specimen collection / handling, submission of specimen other  than nasopharyngeal swab, presence of viral mutation(s) within the  areas targeted by this assay, and inadequate number of viral copies  (<250 copies / mL). A negative result must be combined with clinical  observations, patient history, and epidemiological information. If result is POSITIVE SARS-CoV-2 target nucleic acids are DETECTED. The SARS-CoV-2 RNA is generally detectable in upper and lower  respiratory specimens dur ing the acute phase of infection.  Positive  results are indicative of active infection with SARS-CoV-2.   Clinical  correlation with patient history and other diagnostic information is  necessary to determine patient infection status.  Positive results do  not rule out bacterial infection or co-infection with other viruses. If result is PRESUMPTIVE POSTIVE SARS-CoV-2 nucleic acids MAY BE PRESENT.   A presumptive positive result was obtained on the submitted specimen  and confirmed on repeat testing.  While 2019 novel coronavirus  (SARS-CoV-2) nucleic acids may be present in the submitted sample  additional confirmatory testing may be necessary for epidemiological  and / or clinical management purposes  to differentiate between  SARS-CoV-2 and other Sarbecovirus currently known to infect humans.  If clinically indicated additional testing with an alternate test  methodology [phone]) is advised. The SARS-CoV-2 RNA is generally  detectable in upper and lower respiratory sp ecimens during the acute  phase of infection. The expected result is Negative. Fact Sheet for Patients:  StrictlyIdeas.no Fact Sheet for Healthcare Providers: BankingDealers.co.za This test is not yet approved or cleared by the [place] FDA and has been authorized for detection and/or diagnosis of SARS-CoV-2 by FDA under an Emergency Use Authorization (EUA).  This EUA will remain in effect (meaning this test can be used) for the duration of the COVID-19 declaration under Section 564(b)(1) of the Act, 21 U.S.C. section 360bbb-3(b)(1), unless the authorization is terminated or revoked sooner. Performed at [facility], [place] [address] [place], [place] [address]   SARS Coronavirus 2 (CEPHEID - Performed in [place] hospital lab), Hosp Order     Status: None   Collection Time: 07/20/19  8:13 PM   Specimen: Nasopharyngeal Swab  Result Value Ref Range Status   SARS Coronavirus 2 NEGATIVE NEGATIVE Final    Comment: (NOTE) If result is NEGATIVE SARS-CoV-2 target  nucleic acids are NOT DETECTED. The SARS-CoV-2 RNA is generally detectable in upper and lower  respiratory specimens during the acute phase of infection. The lowest  concentration of SARS-CoV-2 viral copies this assay can detect is 250  copies / mL. A negative result does not preclude SARS-CoV-2 infection  and should not be used as the sole basis for treatment or other  patient management decisions.  A negative result may occur with  improper specimen collection / handling, submission of specimen other  than nasopharyngeal swab, presence of viral mutation(s) within the  areas targeted by this assay, and inadequate number of  viral copies  (<250 copies / mL). A negative result must be combined with clinical  observations, patient history, and epidemiological information. If result is POSITIVE SARS-CoV-2 target nucleic acids are DETECTED. The SARS-CoV-2 RNA is generally detectable in upper and lower  respiratory specimens dur ing the acute phase of infection.  Positive  results are indicative of active infection with SARS-CoV-2.  Clinical  correlation with patient history and other diagnostic information is  necessary to determine patient infection status.  Positive results do  not rule out bacterial infection or co-infection with other viruses. If result is PRESUMPTIVE POSTIVE SARS-CoV-2 nucleic acids MAY BE PRESENT.   A presumptive positive result was obtained on the submitted specimen  and confirmed on repeat testing.  While 2019 novel coronavirus  (SARS-CoV-2) nucleic acids may be present in the submitted sample  additional confirmatory testing may be necessary for epidemiological  and / or clinical management purposes  to differentiate between  SARS-CoV-2 and other Sarbecovirus currently known to infect humans.  If clinically indicated additional testing with an alternate test  methodology [phone] is advised. The SARS-CoV-2 RNA is generally  detectable in upper and lower  respiratory sp ecimens during the acute  phase of infection. The expected result is Negative. Fact Sheet for Patients:  StrictlyIdeas.no Fact Sheet for Healthcare Providers: BankingDealers.co.za This test is not yet approved or cleared by the [place] FDA and has been authorized for detection and/or diagnosis of SARS-CoV-2 by FDA under an Emergency Use Authorization (EUA).  This EUA will remain in effect (meaning this test can be used) for the duration of the COVID-19 declaration under Section 564(b)(1) of the Act, 21 U.S.C. section 360bbb-3(b)(1), unless the authorization is terminated or revoked sooner. Performed at [facility], [place] [address] [place], [place] [address]     Please note: You were cared for by a hospitalist during your hospital stay. Once you are discharged, your primary care physician will handle any further medical issues. Please note that NO REFILLS for any discharge medications will be authorized once you are discharged, as it is imperative that you return to your primary care physician (or establish a relationship with a primary care physician if you do not have one) for your post hospital discharge needs so that they can reassess your need for medications and monitor your lab values.    Time coordinating discharge: 40 minutes  SIGNED:   Shelly Coss, MD  [facility] 07/21/2019, 3:33 PM Pager [phone]  If 7PM-7AM, please contact night-coverage [email or web address] Password [email or web address]

## 2019-07-21 NOTE — Evaluation (Signed)
Occupational Therapy Evaluation Patient Details Name: Travis [name] MRN: [id number] DOB: Dec 06, 1946 Today's Date: 07/21/2019    History of Present Illness 73 y.o. male with history of diastolic CHF last EF measured in May 2020 was 15 to 65% with history of diabetes mellitus type 2, sleep apnea, COPD, chronic kidney disease has been experiencing increasing shortness of breath lower extremity mild for the last 2 weeks despite increasing dose of metolazone.  Denies any chest pain productive cough fever chills.  Has been compliant with his medications. Admitted for CHF exacerbation.   Clinical Impression   Patient presenting with decreased I in self care, balance, functional mobility/transfers, endurance, strength, safety awareness, and endurance. Patient was recently discharged from hospital with [facility] services but reports needing increasing amount of assist at home. Pt has not ambulated 1-2 weeks since initially hospitalization ~1 week ago. Patient currently functioning mod - total A. Patient will benefit from acute OT to increase overall independence in the areas of ADLs, functional mobility, and safety awareness in order to safely discharge to next venue of care.    Follow Up Recommendations  CIR;Supervision/Assistance - 24 hour    Equipment Recommendations  None recommended by OT    Recommendations for Other Services Rehab consult     Precautions / Restrictions Precautions Precautions: Fall Restrictions Weight Bearing Restrictions: No      Mobility Bed Mobility Overal bed mobility: Needs Assistance Bed Mobility: Rolling;Sidelying to Sit Rolling: Supervision Sidelying to sit: Supervision       General bed mobility comments: Pt able to transition to EOB without therapist assist. Pt required significant increased time and effort, as well as heavy use of rails.   Transfers Overall transfer level: Needs assistance Equipment used: Rolling walker (2 wheeled) Transfers: Sit to/from  Stand Sit to Stand: Max assist;+2 physical assistance;From elevated surface         General transfer comment: Not able to clear hips from elevated bed with RW. +2 required for sit<>stand with [facility] for support.     Balance Overall balance assessment: Needs assistance Sitting-balance support: No upper extremity supported;Feet supported Sitting balance-Leahy Scale: Good Sitting balance - Comments: supervision EOB   Standing balance support: Bilateral upper extremity supported Standing balance-Leahy Scale: Poor Standing balance comment: requires UE support        ADL either performed or assessed with clinical judgement   ADL Overall ADL's : Needs assistance/impaired Eating/Feeding: Modified independent   Grooming: Wash/dry hands;Wash/dry face;Oral care;Sitting;Set up   Upper Body Bathing: Minimal assistance;Sitting   Lower Body Bathing: Sit to/from stand;Maximal assistance   Upper Body Dressing : Minimal assistance;Sitting   Lower Body Dressing: Total assistance;Sit to/from stand             Vision Baseline Vision/History: Wears glasses Wears Glasses: Reading only Patient Visual Report: No change from baseline              Pertinent Vitals/Pain Pain Assessment: No/denies pain     Hand Dominance Right   Extremity/Trunk Assessment Upper Extremity Assessment Upper Extremity Assessment: Generalized weakness   Lower Extremity Assessment Lower Extremity Assessment: RLE deficits/detail;LLE deficits/detail RLE Deficits / Details: decreased knee and ankle ROM secondary to edema, UNNA boot applied, strength of quads and hips continues to be weak for power up from low surfaces RLE Sensation: decreased light touch RLE Coordination: decreased fine motor;decreased gross motor LLE Deficits / Details: decreased knee and ankle ROM secondary to edema, UNNA boot applied, strength of quads and hips continues to be weak for  power up from low surfaces LLE Sensation: decreased  light touch LLE Coordination: decreased fine motor;decreased gross motor   Cervical / Trunk Assessment Cervical / Trunk Assessment: Kyphotic   Communication Communication Communication: No difficulties   Cognition Arousal/Alertness: Awake/alert Behavior During Therapy: WFL for tasks assessed/performed Overall Cognitive Status: Within Functional Limits for tasks assessed                    Home Living Family/patient expects to be discharged to:: Private residence Living Arrangements: Spouse/significant other Available Help at Discharge: Family;Available 24 hours/day;Personal care attendant Type of Home: House Home Access: Ramped entrance     Home Layout: One level     Bathroom Shower/Tub: Tub/shower unit;Walk-in shower;Curtain   Bathroom Toilet: Handicapped height Bathroom Accessibility: Yes   Home Equipment: Environmental consultant - 2 wheels;Walker - 4 wheels;Cane - single point;Bedside commode;Wheelchair - manual;Shower seat - built in;Grab bars - tub/shower;Hospital bed;Other (comment);Grab bars - toilet   Additional Comments: 2x/wk aide for 1 hour.      Prior Functioning/Environment Level of Independence: Needs assistance  Gait / Transfers Assistance Needed: pt reports working with HHPT on powering up to standing and was able to ambulate household distances once in standing but hasn't been able to do so in over a week ADL's / Homemaking Assistance Needed: Pt wife assists with ADL's including bathing and dressing            OT Problem List: Decreased strength;Decreased coordination;Decreased activity tolerance;Decreased safety awareness;Impaired balance (sitting and/or standing)      OT Treatment/Interventions: Self-care/ADL training;Therapeutic exercise;Therapeutic activities;Energy conservation;Neuromuscular education;DME and/or AE instruction;Patient/family education;Balance training;Modalities    OT Goals(Current goals can be found in the care plan section) Acute Rehab  OT Goals Patient Stated Goal: to get stronger OT Goal Formulation: With patient Time For Goal Achievement: 08/04/19 Potential to Achieve Goals: Fair ADL Goals Pt Will Perform Lower Body Bathing: with mod assist Pt Will Perform Lower Body Dressing: with mod assist Pt Will Transfer to Toilet: with mod assist Pt Will Perform Toileting - Clothing Manipulation and hygiene: with mod assist  OT Frequency: Min 2X/week   Barriers to D/C: (none known at this time)             AM-PAC OT "6 Clicks" Daily Activity     Outcome Measure Help from another person eating meals?: None Help from another person taking care of personal grooming?: A Little Help from another person toileting, which includes using toliet, bedpan, or urinal?: Total Help from another person bathing (including washing, rinsing, drying)?: A Lot Help from another person to put on and taking off regular upper body clothing?: A Little Help from another person to put on and taking off regular lower body clothing?: Total 6 Click Score: 14   End of Session Equipment Utilized During Treatment: Rolling walker Nurse Communication: Mobility status  Activity Tolerance: Patient tolerated treatment well Patient left: in bed;with call bell/phone within reach;with bed alarm set  OT Visit Diagnosis: Muscle weakness (generalized) (M62.81)                Time: 2633-3545 OT Time Calculation (min): 24 min Charges:  OT General Charges $OT Visit: 1 Visit OT Evaluation $OT Eval Moderate Complexity: 1 Mod OT Treatments $Therapeutic Activity: 8-22 mins   Darleen Crocker P 07/21/2019, 1:06 PM

## 2019-07-21 NOTE — Progress Notes (Signed)
Patient arrived in the unit at 0230am in a stretcher from ED, escorted by ED nurse, alert and oriented x4, resting in a telemetry bed  with monitor M02 at this moment, receiving 2L O2 [place] in 99% satO2 ; and will continue to monitor closely.

## 2019-07-21 NOTE — Plan of Care (Signed)

## 2019-07-21 NOTE — ED Notes (Signed)
ED TO INPATIENT HANDOFF REPORT  ED Nurse Name and Phone #: [phone]  S Name/Age/Gender Travis [name] 73 y.o. male Room/Bed: 041C/041C  Code Status   Code Status: Prior  Home/SNF/Other Home Patient oriented to: self, place, time and situation Is this baseline? Yes   Triage Complete: Triage complete  Chief Complaint [facility]  Triage Note Pt recently discharged from here acute CHF exacerbation. . Pt reports shortness of breath,  Fatigue and weakness. . Pt's family reports pt's O2 sats have been in 69s and had pt using C-pap at home. Pt denies pain. O2 sats 94-96% on 2L [place], which pt typically wears at home.    Allergies Allergies  Allergen Reactions  . Amlodipine Swelling and Other (See Comments)    Causes edema (per [facility] paperwork)  . Plendil [Felodipine] Swelling and Other (See Comments)    Causes edema (per [facility] paperwork)  . Compazine [Prochlorperazine] Other (See Comments)    Caused agitation (per [facility] paperwork)  . Cymbalta [Duloxetine Hcl] Other (See Comments)    Made the patient "feel weird," per [facility] paperwork  . Jardiance [Empagliflozin] Other (See Comments)    Stopped by MD because of unwanted side effects  . Other Nausea Only and Other (See Comments)    ANESTHESIA UNSPECIFIED- makes sick, must have anti-nausea medication in advance  . Levofloxacin In D5w Itching    Possible infusion reaction 03/29/17. Tolerating oral Levaquin 03/30/17  . Septra [Sulfamethoxazole-Trimethoprim] Nausea Only and Other (See Comments)    Per [facility] paperwork    Level of Care/Admitting Diagnosis ED Disposition    ED Disposition Condition [place]: [place] [100100]  Level of Care: Progressive [102]  I expect the patient will be discharged within 24 hours: No (not a candidate for 5C-Observation unit)  Covid Evaluation: Asymptomatic Screening Protocol (No Symptoms)  Diagnosis: Acute  metabolic encephalopathy [[id number]]  Admitting Physician: Toy Baker [[id number]]  Attending Physician: Toy Baker [[id number]]  PT Class (Do Not Modify): Observation [104]  PT Acc Code (Do Not Modify): Observation [10022]       B Medical/Surgery History Past Medical History:  Diagnosis Date  . Cervical myelopathy (HCC)    with myelomalacia at C5-6 and C3-4  . CHF (congestive heart failure) ([place])   . Constipation - functional    controlled with miralax  . COPD (chronic obstructive pulmonary disease) (HCC)    mild  . DJD (degenerative joint disease)    WHOLE SPINE  . Dyspnea   . Gait disorder   . Heart murmur    years ago   . Hypertension   . Hypothyroidism    Low d/t treatment of hyperthyroidism  . Lumbosacral spinal stenosis 03/13/2014  . On home oxygen therapy    "2.5L all the time" (06/29/2018)  . Peripheral neuropathy   . Pneumonia   . PONV (postoperative nausea and vomiting)   . RBBB   . Renal disorder    acute kidney failure  . Restless legs syndrome 03/14/2015  . RLS (restless legs syndrome)   . Sleep apnea    DX  2009/2010 [facility] done at [facility]  . 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 Type II diabetes mellitus ([place])    diet controlled   Past Surgical History:  Procedure Laterality Date  . BACK SURGERY     6 surgeries  . CARDIAC CATHETERIZATION [name] 09/07/2015   Procedure: Right/Left Heart Cath and Coronary Angiography;  Surgeon:  Peter M Martinique, MD;  Location: [place] [facility];  Service: Cardiovascular;  Laterality: [name];  . CHOLECYSTECTOMY    . EYE SURGERY     BIL CATARACTS  . LUMBAR LAMINECTOMY/DECOMPRESSION MICRODISCECTOMY  07/14/2012   Procedure: LUMBAR LAMINECTOMY/DECOMPRESSION MICRODISCECTOMY 2 LEVELS;  Surgeon: Eustace Moore, MD;  Location: [facility];  Service: Neurosurgery;  Laterality: Left;  Lumbar two three laminectomy, left thoracic four five transpedicular diskectomy  . POSTERIOR CERVICAL FUSION/FORAMINOTOMY  04/08/2018   Procedure:  Laminectomy and Foraminotomy - Lumbar Four-Lumbar Five, instrumented posterolateral fusion Lumbar Four- Five, Laminectomy and Foraminotomy - Thoracic One-Thoracic Two;  Surgeon: Eustace Moore, MD;  Location: [facility] OR;  Service: Neurosurgery;;  posterior  . POSTERIOR LUMBAR FUSION  04/08/2018   Laminectomy and Foraminotomy - Lumbar Four-Lumbar Five, instrumented posterolateral fusion Lumbar Four- Five, Laminectomy and Foraminotomy - Thoracic One-Thoracic Two  . POSTERIOR LUMBAR FUSION  04/08/2018   Laminectomy and Foraminotomy - Lumbar Four-Lumbar Five, instrumented posterolateral fusion Lumbar Four- Five, Laminectomy and Foraminotomy - Thoracic One-Thoracic Two  . RIGHT HEART CATH [name] 07/18/2019   Procedure: RIGHT HEART CATH;  Surgeon: Jolaine Artist, MD;  Location: [place] [facility];  Service: Cardiovascular;  Laterality: [name];     A IV Location/Drains/Wounds Patient Lines/Drains/Airways Status   Active Line/Drains/Airways    Name:   Placement date:   Placement time:   Site:   Days:   External Urinary Catheter   05/04/19    1534    -   78   Incision (Closed) 04/08/18 Back Other (Comment)   04/08/18    1128     469   Incision (Closed) 04/08/18 Back   04/08/18    1308     469   Pressure Ulcer 07/27/14 Stage II -  Partial thickness loss of dermis presenting as a shallow open ulcer with a red, pink wound bed without slough.   07/27/14    1000     1820   Pressure Injury 07/02/18   07/02/18    1900     384   Pressure Injury 11/21/18 Stage III -  Full thickness tissue loss. Subcutaneous fat may be visible but bone, tendon or muscle are NOT exposed. Full thicknes small open area to sacrum.   11/21/18    0005     242   Pressure Injury 05/01/19 Stage II -  Partial thickness loss of dermis presenting as a shallow open ulcer with a red, pink wound bed without slough.   05/01/19    0615     81   Wound / Incision (Open or Dehisced) 06/28/18 Other (Comment) Other (Comment)   06/28/18    0621    Other  (Comment)   388          Intake/Output Last 24 hours No intake or output data in the 24 hours ending 07/21/19 0138  Labs/Imaging Results for orders placed or performed during the hospital encounter of 07/20/19 (from the past 48 hour(s))  Basic metabolic panel     Status: Abnormal   Collection Time: 07/20/19  4:28 PM  Result Value Ref Range   Sodium 134 (L) 135 - 145 mmol/L   Potassium 3.8 3.5 - 5.1 mmol/L   Chloride 92 (L) 98 - 111 mmol/L   CO2 28 22 - 32 mmol/L   Glucose, Bld 287 (H) 70 - 99 mg/dL   BUN 96 (H) 8 - 23 mg/dL   Creatinine, Ser 2.18 (H) 0.61 - 1.24 mg/dL  Calcium 8.9 8.9 - 10.3 mg/dL   GFR calc non Af Amer 29 (L) >60 mL/min   GFR calc Af Amer 33 (L) >60 mL/min   Anion gap 14 5 - 15    Comment: Performed at [facility] [address], [place], [place] [address]  CBC     Status: None   Collection Time: 07/20/19  4:28 PM  Result Value Ref Range   WBC 7.3 4.0 - 10.5 K/uL   RBC 4.41 4.22 - 5.81 MIL/uL   Hemoglobin 13.0 13.0 - 17.0 g/dL   HCT 41.0 39.0 - 52.0 %   MCV 93.0 80.0 - 100.0 fL   MCH 29.5 26.0 - 34.0 pg   MCHC 31.7 30.0 - 36.0 g/dL   RDW 15.2 11.5 - 15.5 %   Platelets 227 150 - 400 K/uL   nRBC 0.0 0.0 - 0.2 %    Comment: Performed at [facility], [place] [address], [place], [place] [address]  Urinalysis, Routine w reflex microscopic     Status: Abnormal   Collection Time: 07/20/19  5:28 PM  Result Value Ref Range   Color, Urine YELLOW YELLOW   APPearance CLEAR CLEAR   Specific Gravity, Urine 1.010 1.005 - 1.030   pH 5.0 5.0 - 8.0   Glucose, UA NEGATIVE NEGATIVE mg/dL   Hgb urine dipstick NEGATIVE NEGATIVE   Bilirubin Urine NEGATIVE NEGATIVE   Ketones, ur NEGATIVE NEGATIVE mg/dL   Protein, ur NEGATIVE NEGATIVE mg/dL   Nitrite NEGATIVE NEGATIVE   Leukocytes,Ua LARGE (A) NEGATIVE   RBC / HPF 0-5 0 - 5 RBC/hpf   WBC, UA >50 (H) 0 - 5 WBC/hpf   Bacteria, UA RARE (A) NONE SEEN   Squamous Epithelial / LPF 0-5 0 - 5   WBC Clumps PRESENT     Mucus PRESENT    Hyaline Casts, UA PRESENT     Comment: Performed at [facility], [address], [place], [place] [address]  Troponin I (High Sensitivity)     Status: None   Collection Time: 07/20/19  5:45 PM  Result Value Ref Range   Troponin I (High Sensitivity) 13 <18 ng/L    Comment: (NOTE) Elevated high sensitivity troponin I (hsTnI) values and significant  changes across serial measurements may suggest ACS but many other  chronic and acute conditions are known to elevate hsTnI results.  Refer to the "Links" section for chest pain algorithms and additional  guidance. Performed at [facility], [place] [address] [place], [place] [address]   Brain natriuretic peptide     Status: None   Collection Time: 07/20/19  5:45 PM  Result Value Ref Range   B Natriuretic Peptide 23.2 0.0 - 100.0 pg/mL    Comment: Performed at [facility] [address] [place], [place] [address]  POCT I-Stat EG7     Status: Abnormal   Collection Time: 07/20/19  6:01 PM  Result Value Ref Range   pH, Ven 7.298 7.250 - 7.430   pCO2, Ven 64.7 (H) 44.0 - 60.0 mmHg   pO2, Ven 64.0 (H) 32.0 - 45.0 mmHg   Bicarbonate 31.7 (H) 20.0 - 28.0 mmol/L   TCO2 34 (H) 22 - 32 mmol/L   O2 Saturation 89.0 %   Acid-Base Excess 4.0 (H) 0.0 - 2.0 mmol/L   Sodium 137 135 - 145 mmol/L   Potassium 3.7 3.5 - 5.1 mmol/L   Calcium, Ion 1.06 (L) 1.15 - 1.40 mmol/L  HCT 37.0 (L) 39.0 - 52.0 %   Hemoglobin 12.6 (L) 13.0 - 17.0 g/dL   Patient temperature HIDE    Sample type VENOUS   Troponin I (High Sensitivity)     Status: None   Collection Time: 07/20/19  7:30 PM  Result Value Ref Range   Troponin I (High Sensitivity) 11 <18 ng/L    Comment: (NOTE) Elevated high sensitivity troponin I (hsTnI) values and significant  changes across serial measurements may suggest ACS but many other  chronic and acute conditions are known to elevate hsTnI results.  Refer to the "Links" section for chest pain algorithms  and additional  guidance. Performed at [facility], [place] [address], [place], [place] [address]   SARS Coronavirus 2 (CEPHEID - Performed in [place] hospital lab), Hosp Order     Status: None   Collection Time: 07/20/19  8:13 PM   Specimen: Nasopharyngeal Swab  Result Value Ref Range   SARS Coronavirus 2 NEGATIVE NEGATIVE    Comment: (NOTE) If result is NEGATIVE SARS-CoV-2 target nucleic acids are NOT DETECTED. The SARS-CoV-2 RNA is generally detectable in upper and lower  respiratory specimens during the acute phase of infection. The lowest  concentration of SARS-CoV-2 viral copies this assay can detect is 250  copies / mL. A negative result does not preclude SARS-CoV-2 infection  and should not be used as the sole basis for treatment or other  patient management decisions.  A negative result may occur with  improper specimen collection / handling, submission of specimen other  than nasopharyngeal swab, presence of viral mutation(s) within the  areas targeted by this assay, and inadequate number of viral copies  (<250 copies / mL). A negative result must be combined with clinical  observations, patient history, and epidemiological information. If result is POSITIVE SARS-CoV-2 target nucleic acids are DETECTED. The SARS-CoV-2 RNA is generally detectable in upper and lower  respiratory specimens dur ing the acute phase of infection.  Positive  results are indicative of active infection with SARS-CoV-2.  Clinical  correlation with patient history and other diagnostic information is  necessary to determine patient infection status.  Positive results do  not rule out bacterial infection or co-infection with other viruses. If result is PRESUMPTIVE POSTIVE SARS-CoV-2 nucleic acids MAY BE PRESENT.   A presumptive positive result was obtained on the submitted specimen  and confirmed on repeat testing.  While 2019 novel coronavirus  (SARS-CoV-2) nucleic acids may be present in  the submitted sample  additional confirmatory testing may be necessary for epidemiological  and / or clinical management purposes  to differentiate between  SARS-CoV-2 and other Sarbecovirus currently known to infect humans.  If clinically indicated additional testing with an alternate test  methodology [id number]) is advised. The SARS-CoV-2 RNA is generally  detectable in upper and lower respiratory sp ecimens during the acute  phase of infection. The expected result is Negative. Fact Sheet for Patients:  StrictlyIdeas.no Fact Sheet for Healthcare Providers: BankingDealers.co.za This test is not yet approved or cleared by the [place] FDA and has been authorized for detection and/or diagnosis of SARS-CoV-2 by FDA under an Emergency Use Authorization (EUA).  This EUA will remain in effect (meaning this test can be used) for the duration of the COVID-19 declaration under Section 564(b)(1) of the Act, 21 U.S.C. section 360bbb-3(b)(1), unless the authorization is terminated or revoked sooner. Performed at [facility], [place] [address], [place], [place] [address]   D-dimer, quantitative (not  at [facility])     Status: Abnormal   Collection Time: 07/20/19  9:13 PM  Result Value Ref Range   D-Dimer, Quant 0.62 (H) 0.00 - 0.50 ug/mL-FEU    Comment: (NOTE) At the manufacturer cut-off of 0.50 ug/mL FEU, this assay has been documented to exclude PE with a sensitivity and negative predictive value of 97 to 99%.  At this time, this assay has not been approved by the FDA to exclude DVT/VTE. Results should be correlated with clinical presentation. Performed at [facility], [place] [address], [place], [place] [address]   CBG monitoring, ED     Status: Abnormal   Collection Time: 07/20/19 11:32 PM  Result Value Ref Range   Glucose-Capillary 207 (H) 70 - 99 mg/dL   Comment 1 Notify RN    Comment 2 Document in Chart    Dg Abd 1  View  Result Date: 07/20/2019 CLINICAL DATA:  Constipation for the past 2-3 days. EXAM: ABDOMEN - 1 VIEW COMPARISON:  CT abdomen pelvis dated November 29, 2018. FINDINGS: Unchanged prominent gaseous distention of the stomach. The bowel gas pattern is normal. Large amount of stool throughout the colon. No radio-opaque calculi or other significant radiographic abnormality are seen. No acute osseous abnormality. IMPRESSION: Large colonic stool burden. Electronically Signed   By: Titus Dubin M.D.   On: 07/20/2019 21:54   Ct Head Wo Contrast  Result Date: 07/21/2019 CLINICAL DATA:  73 year old male with history of diabetes presenting with encephalopathy. EXAM: CT HEAD WITHOUT CONTRAST TECHNIQUE: Contiguous axial images were obtained from the base of the skull through the vertex without intravenous contrast. COMPARISON:  Head CT dated 06/28/2018 FINDINGS: Brain: Mild age-related atrophy and chronic microvascular ischemic changes. There is no acute intracranial hemorrhage. No mass effect or midline shift. No extra-axial fluid collection. Vascular: High attenuating cerebral arteries, likely related to hemoconcentration/dehydration. Skull: Normal. Negative for fracture or focal lesion. Sinuses/Orbits: Small bilateral maxillary sinus retention cysts or polyps. No air-fluid level. The mastoid air cells are clear. Other: None IMPRESSION: 1. No acute intracranial hemorrhage. 2. Mild age-related atrophy and chronic microvascular ischemic changes. Electronically Signed   By: Anner Crete M.D.   On: 07/21/2019 00:41   Dg Chest Port 1 View  Result Date: 07/20/2019 CLINICAL DATA:  Fatigue and shortness of breath. Recently discharged for CHF exacerbation. EXAM: PORTABLE CHEST 1 VIEW COMPARISON:  Chest x-ray dated July 13, 2019. FINDINGS: Stable cardiomediastinal silhouette partially obscured by elevated hemidiaphragms. Atherosclerotic calcification of the aortic arch. Normal pulmonary vascularity. Persistent low lung  volumes with bibasilar atelectasis. No focal consolidation, pleural effusion, or pneumothorax. No acute osseous abnormality. IMPRESSION: 1. Persistent low lung volumes with bibasilar atelectasis. Electronically Signed   By: Titus Dubin M.D.   On: 07/20/2019 19:12    Pending Labs [employer] (From admission, onward)    Start     Ordered   Signed and Held  Magnesium  Tomorrow morning,   R    Comments: Call MD if <1.5    Signed and Held   Signed and Held  Phosphorus  Tomorrow morning,   R     Signed and Held   Signed and Held  Comprehensive metabolic panel  Once,   R    Comments: Cal MD for K<3.5 or >5.0    Signed and Held   Signed and Held  CBC  Once,   R    Comments: Call for hg <8.0    Signed and Held  Vitals/Pain Today's Vitals   07/20/19 2330 07/21/19 0015 07/21/19 0024 07/21/19 0030  BP: 139/69 (!) 123/92    Pulse: 76   80  Resp: 17     Temp:      TempSrc:      SpO2: 94%  95% 93%  Weight:      Height:      PainSc:        Isolation Precautions No active isolations  Medications Medications  sodium chloride flush (NS) 0.9 % injection 3 mL (3 mLs Intravenous Not Given 07/20/19 1905)  gabapentin (NEURONTIN) capsule 400 mg (has no administration in time range)  milk and molasses enema (has no administration in time range)    Mobility walks with person assist High fall risk   Focused Assessments Pulmonary Assessment Handoff:  Lung sounds: L Breath Sounds: Clear, Diminished R Breath Sounds: Clear, Diminished O2 Device: Room Air O2 Flow Rate (L/min): 2 L/min      R Recommendations: See Admitting Provider Note  Report given to:   Additional Notes: baseline 2.5L [place] at home.

## 2019-07-22 DIAGNOSIS — I5030 Unspecified diastolic (congestive) heart failure: Secondary | ICD-10-CM | POA: Diagnosis not present

## 2019-07-22 DIAGNOSIS — N400 Enlarged prostate without lower urinary tract symptoms: Secondary | ICD-10-CM | POA: Diagnosis not present

## 2019-07-22 DIAGNOSIS — I5033 Acute on chronic diastolic (congestive) heart failure: Secondary | ICD-10-CM | POA: Diagnosis not present

## 2019-07-22 DIAGNOSIS — E1129 Type 2 diabetes mellitus with other diabetic kidney complication: Secondary | ICD-10-CM | POA: Diagnosis not present

## 2019-07-22 DIAGNOSIS — I129 Hypertensive chronic kidney disease with stage 1 through stage 4 chronic kidney disease, or unspecified chronic kidney disease: Secondary | ICD-10-CM | POA: Diagnosis not present

## 2019-07-22 DIAGNOSIS — N183 Chronic kidney disease, stage 3 (moderate): Secondary | ICD-10-CM | POA: Diagnosis not present

## 2019-07-22 DIAGNOSIS — E890 Postprocedural hypothyroidism: Secondary | ICD-10-CM | POA: Diagnosis not present

## 2019-07-22 LAB — URINE CULTURE: Culture: 50000 — AB

## 2019-07-23 DIAGNOSIS — Z6838 Body mass index (BMI) 38.0-38.9, adult: Secondary | ICD-10-CM | POA: Diagnosis not present

## 2019-07-23 DIAGNOSIS — E1142 Type 2 diabetes mellitus with diabetic polyneuropathy: Secondary | ICD-10-CM | POA: Diagnosis not present

## 2019-07-23 DIAGNOSIS — E1122 Type 2 diabetes mellitus with diabetic chronic kidney disease: Secondary | ICD-10-CM | POA: Diagnosis not present

## 2019-07-23 DIAGNOSIS — N183 Chronic kidney disease, stage 3 (moderate): Secondary | ICD-10-CM | POA: Diagnosis not present

## 2019-07-23 DIAGNOSIS — F329 Major depressive disorder, single episode, unspecified: Secondary | ICD-10-CM | POA: Diagnosis not present

## 2019-07-23 DIAGNOSIS — R338 Other retention of urine: Secondary | ICD-10-CM | POA: Diagnosis not present

## 2019-07-23 DIAGNOSIS — M4807 Spinal stenosis, lumbosacral region: Secondary | ICD-10-CM | POA: Diagnosis not present

## 2019-07-23 DIAGNOSIS — M4712 Other spondylosis with myelopathy, cervical region: Secondary | ICD-10-CM | POA: Diagnosis not present

## 2019-07-23 DIAGNOSIS — I130 Hypertensive heart and chronic kidney disease with heart failure and stage 1 through stage 4 chronic kidney disease, or unspecified chronic kidney disease: Secondary | ICD-10-CM | POA: Diagnosis not present

## 2019-07-23 DIAGNOSIS — I5033 Acute on chronic diastolic (congestive) heart failure: Secondary | ICD-10-CM | POA: Diagnosis not present

## 2019-07-23 DIAGNOSIS — J9621 Acute and chronic respiratory failure with hypoxia: Secondary | ICD-10-CM | POA: Diagnosis not present

## 2019-07-23 DIAGNOSIS — E039 Hypothyroidism, unspecified: Secondary | ICD-10-CM | POA: Diagnosis not present

## 2019-07-23 DIAGNOSIS — Z9981 Dependence on supplemental oxygen: Secondary | ICD-10-CM | POA: Diagnosis not present

## 2019-07-23 DIAGNOSIS — J449 Chronic obstructive pulmonary disease, unspecified: Secondary | ICD-10-CM | POA: Diagnosis not present

## 2019-07-23 DIAGNOSIS — G4733 Obstructive sleep apnea (adult) (pediatric): Secondary | ICD-10-CM | POA: Diagnosis not present

## 2019-07-23 DIAGNOSIS — M5030 Other cervical disc degeneration, unspecified cervical region: Secondary | ICD-10-CM | POA: Diagnosis not present

## 2019-07-23 DIAGNOSIS — N401 Enlarged prostate with lower urinary tract symptoms: Secondary | ICD-10-CM | POA: Diagnosis not present

## 2019-07-23 DIAGNOSIS — Z794 Long term (current) use of insulin: Secondary | ICD-10-CM | POA: Diagnosis not present

## 2019-07-23 DIAGNOSIS — Z87891 Personal history of nicotine dependence: Secondary | ICD-10-CM | POA: Diagnosis not present

## 2019-07-23 DIAGNOSIS — G2581 Restless legs syndrome: Secondary | ICD-10-CM | POA: Diagnosis not present

## 2019-07-23 DIAGNOSIS — M4714 Other spondylosis with myelopathy, thoracic region: Secondary | ICD-10-CM | POA: Diagnosis not present

## 2019-07-23 DIAGNOSIS — J9622 Acute and chronic respiratory failure with hypercapnia: Secondary | ICD-10-CM | POA: Diagnosis not present

## 2019-08-02 NOTE — Progress Notes (Signed)
Heart Failure [facility]  ID:  Matson, Welch January 26, 1946, MRN [id number]  Location: Home  Provider location: [address] [place] Type of Visit: Established patient   PCP:  [name], MD  Cardiologist:  Mertie Moores, MD Primary HF: Dr [name]  Chief Complaint: Hospital follow up   History of Present Illness: Che P Cross is a 73 y.o. 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  Admitted 5/2-5/13/20 with volume overload. Diuresed with lasix drip and metolazone. Had AKI that improved with switching back to oral diuretics. Also treated for cellulitis with doxy. PT recommended SNF, but he refused. DC weight 281 lbs. He was discharged on torsemide 80 mg daily with 2.5 mg metolazone weekly. Discharged with [facility] HHRN/HHPT.   Admitted 12/30/70 with A/C diastolic HF. Diuresed with lasix drip and later transitioned to torsemide 80 mg twice a day + metolazone twice a week.Discharge weight was 285 pounds.  Re-admitted 07/21/19 with AMS. He was treated for UTI and sent home the next day.   Today he presented for HF follow up with his daughter in law Engineer, manufacturing systems on [place]) Overall feeling fair. Feels like he is picking up fluid.  SOB with exertion. Denies PND/Orthopnea. Uses oxygen at needed at home. Appetite ok. Likes to eat hot dogs and crackers.  Not moving around much at home.  No fever or chills. Unable to weigh at home due to balance issues. Taking all medications.  Pt denies symptoms of cough, fevers, chills, or new SOB worrisome for COVID 19.   RHC 7/20//20  RA = 8 RV = 42/9 PA = 44/11 (26) Mild pulmonary hypertension.  PCW = 13 Fick cardiac output/index = 8.8/3.5 PVR = 1.5 WU Ao sat = 93% PA sat = 79%, 71%  RHC 06/30/2019 RA = 8 RV = 42/9 PA = 44/11 (26) PCW = 13 Fick cardiac output/index = 8.8/3.5 PVR = 1.5 WU Ao sat = 93% PA sat = 79%, 71%  Assessment: 1. Mild PAH 2. Well compensated filling pressures and output Metolazone to twice a week (Monday and Friday). Dry weight now 285-287.     Past Medical History:  Diagnosis Date  . Cervical myelopathy (HCC)    with myelomalacia at C5-6 and C3-4  . CHF (congestive heart failure) ([place])   . Constipation - functional    controlled with miralax  . COPD (chronic obstructive pulmonary disease) (HCC)    mild  . DJD (degenerative joint disease)    WHOLE SPINE  . Dyspnea   . Gait disorder   . Heart murmur    years ago   . Hypertension   . Hypothyroidism    Low d/t treatment of hyperthyroidism  . Lumbosacral spinal stenosis 03/13/2014  . On home oxygen therapy    "2.5L all the time" (06/29/2018)  . Peripheral neuropathy   . Pneumonia   . PONV (postoperative nausea and vomiting)   . RBBB   . Renal disorder    acute kidney failure  . Restless legs syndrome 03/14/2015  . RLS (restless legs syndrome)   . Sleep apnea    DX  2009/2010 [facility]  . 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 Type II diabetes mellitus ([place])    diet controlled   Past Surgical History:  Procedure Laterality Date  . BACK SURGERY     6 surgeries  . CARDIAC  CATHETERIZATION [name] 09/07/2015   Procedure: Right/Left Heart Cath and Coronary Angiography;  Surgeon: Peter M Martinique, MD;  Location: [facility];  Service: Cardiovascular;  Laterality: [name];  . CHOLECYSTECTOMY    . EYE SURGERY     BIL CATARACTS  . LUMBAR LAMINECTOMY/DECOMPRESSION MICRODISCECTOMY  07/14/2012   Procedure: LUMBAR LAMINECTOMY/DECOMPRESSION MICRODISCECTOMY 2 LEVELS;  Surgeon: Eustace Moore, MD;  Location: [facility];  Service: Neurosurgery;  Laterality: Left;  Lumbar two three laminectomy, left thoracic four five transpedicular diskectomy  . POSTERIOR CERVICAL FUSION/FORAMINOTOMY  04/08/2018   Procedure: Laminectomy and Foraminotomy - Lumbar Four-Lumbar Five, instrumented posterolateral fusion Lumbar Four- Five,  Laminectomy and Foraminotomy - Thoracic One-Thoracic Two;  Surgeon: Eustace Moore, MD;  Location: [facility] OR;  Service: Neurosurgery;;  posterior  . POSTERIOR LUMBAR FUSION  04/08/2018   Laminectomy and Foraminotomy - Lumbar Four-Lumbar Five, instrumented posterolateral fusion Lumbar Four- Five, Laminectomy and Foraminotomy - Thoracic One-Thoracic Two  . POSTERIOR LUMBAR FUSION  04/08/2018   Laminectomy and Foraminotomy - Lumbar Four-Lumbar Five, instrumented posterolateral fusion Lumbar Four- Five, Laminectomy and Foraminotomy - Thoracic One-Thoracic Two  . RIGHT HEART CATH [name] 07/18/2019   Procedure: RIGHT HEART CATH;  Surgeon: Jolaine Artist, MD;  Location: [facility];  Service: Cardiovascular;  Laterality: [name];     Current Outpatient Medications  Medication Sig Dispense Refill  . aspirin EC 81 MG tablet Take 81 mg by mouth daily.    [name] atorvastatin (LIPITOR) 20 MG tablet Take 20 mg by mouth every evening.     . Blood Glucose Monitoring Suppl (ONETOUCH VERIO) w/Device KIT 1 kit by Does not apply route 4 (four) times daily. Use as directed to test blood sugars 4 times daily DX E11.42 1 kit 0  . cefdinir (OMNICEF) 300 MG capsule Take 300 mg by mouth 2 (two) times daily.    . Continuous Blood Gluc Receiver (FREESTYLE LIBRE 14 DAY READER) DEVI 1 kit by Does not apply route 3 (three) times daily. 1 Device 0  . Continuous Blood Gluc Sensor (FREESTYLE LIBRE 14 DAY SENSOR) MISC 1 kit by Does not apply route 3 (three) times daily. 3 each 11  . escitalopram (LEXAPRO) 20 MG tablet Take 20 mg by mouth daily.    [name] gabapentin (NEURONTIN) 400 MG capsule Take 400 mg by mouth 3 (three) times daily.    [name] glucose blood test strip Use as instructed to test blood sugars 4 times daily DX E11.42 100 each 12  . hydrALAZINE (APRESOLINE) 25 MG tablet Take 25 mg by mouth 3 (three) times daily.    . insulin aspart (NOVOLOG FLEXPEN) 100 UNIT/ML FlexPen Inject 18 Units into the skin 3 (three) times daily with  meals. 15 mL 11  . Insulin Glargine (LANTUS SOLOSTAR) 100 UNIT/ML Solostar Pen Inject 60 Units into the skin at bedtime. 15 mL 11  . Insulin Pen Needle (BD PEN NEEDLE MICRO U/F) 32G X 6 MM MISC Four times daily 150 each 6  . Insulin Pen Needle (BD PEN NEEDLE MICRO U/F) 32G X 6 MM MISC 4 times a day 150 each 11  . Lancets (ONETOUCH ULTRASOFT) lancets Use as instructed to test blood sugars 4 times daily DX E11.42 100 each 12  . levothyroxine (SYNTHROID, LEVOTHROID) 175 MCG tablet Take 1 tablet (175 mcg total) by mouth daily. 30 tablet 2  . metolazone (ZAROXOLYN) 2.5 MG tablet Take 1 tablet (2.5 mg total) by mouth 2 (two) times a week. Monday AND FRIDAY 10 tablet  0  . multivitamin (ONE-A-DAY MEN'S) TABS tablet Take 1 tablet by mouth at bedtime.    . OXYGEN Inhale 2-3 Units into the lungs at bedtime.    . polyethylene glycol (MIRALAX / GLYCOLAX) packet Take 17 g by mouth daily as needed. For constipation (Patient taking differently: Take 17 g by mouth at bedtime. ) 14 each 0  . potassium chloride SA (K-DUR) 20 MEQ tablet Take 1 tablet (20 mEq total) by mouth 2 (two) times daily. Take 1 tab only on days you take Metolazone 60 tablet 0  . pramipexole (MIRAPEX) 0.5 MG tablet One tablet in the morning and 2 in the evening (Patient taking differently: Take 0.5 mg by mouth 2 (two) times daily. ) 270 tablet 1  . spironolactone (ALDACTONE) 25 MG tablet Take 1 tablet (25 mg total) by mouth daily. 90 tablet 3  . tamsulosin (FLOMAX) 0.4 MG CAPS capsule Take 1 capsule (0.4 mg total) by mouth daily. (Patient taking differently: Take 0.4 mg by mouth at bedtime. ) 30 capsule 0  . torsemide (DEMADEX) 20 MG tablet Take 4 tablets (80 mg total) by mouth 2 (two) times daily. 240 tablet 0  . TOVIAZ 8 MG TB24 tablet Take 1 tablet (8 mg total) by mouth daily. 30 tablet 11   No current facility-administered medications for this encounter.     Allergies:   Amlodipine, Plendil [felodipine], Compazine [prochlorperazine],  Cymbalta [duloxetine hcl], Jardiance [empagliflozin], Other, Levofloxacin in d5w, and Septra [sulfamethoxazole-trimethoprim]   Social History:  The patient  reports that he quit smoking about 34 years ago. His smoking use included cigarettes. He has a 40.00 pack-year smoking history. He quit smokeless tobacco use about 34 years ago. He reports that he does not drink alcohol or use drugs.   Family History:  The patient's family history includes CAD in an other family member; Diabetes in his father; Heart disease in his father; Hypertension in his father; Polycythemia in his mother.   ROS:  Please see the history of present illness.   All other systems are personally reviewed and negative.    Wt Readings from Last 3 Encounters:  08/03/19 136 kg (299 lb 12.8 oz)  07/20/19 129.3 kg (285 lb)  07/18/19 129.3 kg (285 lb 0.9 oz)   Today's Vitals   08/03/19 0958  BP: (!) 148/81  Pulse: 64  SpO2: (!) 88%  Weight: 136 kg (299 lb 12.8 oz)   Body mass index is 39.55 kg/m.  Exam:   General:   No resp difficulty. In a wheelchaiar.  HEENT: normal Neck: supple. JVP to jaw . Carotids 2+ bilat; no bruits. No lymphadenopathy or thryomegaly appreciated. Cor: PMI nondisplaced. Regular rate & rhythm. No rubs, gallops or murmurs. Lungs: clear Abdomen: soft, nontender, distended. No hepatosplenomegaly. No bruits or masses. Good bowel sounds. Extremities: no cyanosis, clubbing, rash, R andLLE 2-3+ edema Neuro: alert & orientedx3, cranial nerves grossly intact. moves all 4 extremities w/o difficulty. Affect pleasant  Recent Labs: 07/14/2019: ALT 14; TSH 1.243 07/20/2019: B Natriuretic Peptide 23.2 07/21/2019: BUN 89; Creatinine, Ser 1.78; Hemoglobin 13.0; Magnesium 3.1; Platelets 221; Potassium 3.7; Sodium 138  Personally reviewed  Wt Readings from Last 3 Encounters:  08/03/19 136 kg  07/20/19 129.3 kg  07/18/19 129.3 kg    ASSESSMENT AND PLAN:  1.Acute/Chronic Diastolic Heart Failure with R>>L  symptoms - ECHO 06/29/2018 EF 55-60% Grade IDD  - Suspect due to R-sided HF from OSA, OHS and COPD - PYP scan 10/2018 with Grade 1, H/CLL 1.45.  Reviewed by Dr. Mauricia Area determined to be negative.Myeloma panel with no M-spike, normal IFE. - Echo 5/4 EF 60-65%, normal RV NYHA IIIb. Volume status elevated. Give 80 mg IV lasix + 2.5 mg metolazone in the clinic. - Starting tomorrow he increase torsemide to 100 mg twice a day and increase metolazone to M-W-F.  -Continue spiro 25 daily - Intolerant to Jardiance with UTI and rash. -Check BMET and BNP today.  -  2.Chronic Hypoxic Respiratory Failure/COPD - Continue BiPAP qHS with 3 L O2.  -Continue nightly.   3. CKD Stage III -Ask [facility] to check BMET   4. OSA - Continue Bipap nightly.   5. CAD - Non-obstructive. LHC 08/2015: LM 40, LAD distal 40, LCx ostial 50, RCA proximal 40 -No chest pain.  - On ASA/statin - Off b-blocker   6. DM - Uncontrolled. A1C is 10.0 -Intolerant to Arma with UTI and rash. - Per PCP.  7.Deconditioning - Followed by [facility] - PT  8. RLE/LLE  Continue unna boots if he can tolerate. He cant get ted hose on.   I am concerned for about his disease trajectory. We briefly discussed goals of care today and he wished to remain a full code with aggressive care. He is at high risk for readmit.   Follow up next week to reassess volume status.   Darrick Grinder, NP  08/03/2019 10:11 AM   [facility] [address] [facility] [place] [address] [phone] (office) [phone] (fax)

## 2019-08-03 ENCOUNTER — Ambulatory Visit (HOSPITAL_COMMUNITY)
Admission: RE | Admit: 2019-08-03 | Discharge: 2019-08-03 | Disposition: A | Discharge status: Home / Self Care | Payer: PPO | Source: Ambulatory Visit | Attending: Cardiology | Admitting: Cardiology

## 2019-08-03 ENCOUNTER — Other Ambulatory Visit: Payer: Self-pay

## 2019-08-03 ENCOUNTER — Encounter (HOSPITAL_COMMUNITY): Payer: Self-pay

## 2019-08-03 VITALS — BP 148/81 | HR 64 | Wt 299.8 lb

## 2019-08-03 DIAGNOSIS — I130 Hypertensive heart and chronic kidney disease with heart failure and stage 1 through stage 4 chronic kidney disease, or unspecified chronic kidney disease: Secondary | ICD-10-CM | POA: Insufficient documentation

## 2019-08-03 DIAGNOSIS — R5381 Other malaise: Secondary | ICD-10-CM | POA: Diagnosis not present

## 2019-08-03 DIAGNOSIS — Z87891 Personal history of nicotine dependence: Secondary | ICD-10-CM | POA: Insufficient documentation

## 2019-08-03 DIAGNOSIS — I2510 Atherosclerotic heart disease of native coronary artery without angina pectoris: Secondary | ICD-10-CM | POA: Diagnosis not present

## 2019-08-03 DIAGNOSIS — Z794 Long term (current) use of insulin: Secondary | ICD-10-CM | POA: Insufficient documentation

## 2019-08-03 DIAGNOSIS — E1122 Type 2 diabetes mellitus with diabetic chronic kidney disease: Secondary | ICD-10-CM | POA: Insufficient documentation

## 2019-08-03 DIAGNOSIS — Z882 Allergy status to sulfonamides status: Secondary | ICD-10-CM | POA: Insufficient documentation

## 2019-08-03 DIAGNOSIS — E890 Postprocedural hypothyroidism: Secondary | ICD-10-CM | POA: Diagnosis not present

## 2019-08-03 DIAGNOSIS — N183 Chronic kidney disease, stage 3 unspecified: Secondary | ICD-10-CM

## 2019-08-03 DIAGNOSIS — Z7982 Long term (current) use of aspirin: Secondary | ICD-10-CM | POA: Diagnosis not present

## 2019-08-03 DIAGNOSIS — J9611 Chronic respiratory failure with hypoxia: Secondary | ICD-10-CM | POA: Diagnosis not present

## 2019-08-03 DIAGNOSIS — I5033 Acute on chronic diastolic (congestive) heart failure: Secondary | ICD-10-CM | POA: Diagnosis not present

## 2019-08-03 DIAGNOSIS — Z981 Arthrodesis status: Secondary | ICD-10-CM | POA: Diagnosis not present

## 2019-08-03 DIAGNOSIS — G2581 Restless legs syndrome: Secondary | ICD-10-CM | POA: Diagnosis not present

## 2019-08-03 DIAGNOSIS — Z79899 Other long term (current) drug therapy: Secondary | ICD-10-CM | POA: Diagnosis not present

## 2019-08-03 DIAGNOSIS — Z888 Allergy status to other drugs, medicaments and biological substances status: Secondary | ICD-10-CM | POA: Insufficient documentation

## 2019-08-03 DIAGNOSIS — G4733 Obstructive sleep apnea (adult) (pediatric): Secondary | ICD-10-CM

## 2019-08-03 DIAGNOSIS — Z79890 Hormone replacement therapy (postmenopausal): Secondary | ICD-10-CM | POA: Insufficient documentation

## 2019-08-03 DIAGNOSIS — Z9981 Dependence on supplemental oxygen: Secondary | ICD-10-CM | POA: Diagnosis not present

## 2019-08-03 DIAGNOSIS — Z833 Family history of diabetes mellitus: Secondary | ICD-10-CM | POA: Diagnosis not present

## 2019-08-03 DIAGNOSIS — I5032 Chronic diastolic (congestive) heart failure: Secondary | ICD-10-CM

## 2019-08-03 DIAGNOSIS — R600 Localized edema: Secondary | ICD-10-CM | POA: Diagnosis not present

## 2019-08-03 DIAGNOSIS — J449 Chronic obstructive pulmonary disease, unspecified: Secondary | ICD-10-CM | POA: Diagnosis not present

## 2019-08-03 DIAGNOSIS — E1142 Type 2 diabetes mellitus with diabetic polyneuropathy: Secondary | ICD-10-CM | POA: Insufficient documentation

## 2019-08-03 DIAGNOSIS — Z8249 Family history of ischemic heart disease and other diseases of the circulatory system: Secondary | ICD-10-CM | POA: Diagnosis not present

## 2019-08-03 LAB — BASIC METABOLIC PANEL
Anion gap: 11 (ref 5–15)
BUN: 58 mg/dL — ABNORMAL HIGH (ref 8–23)
CO2: 31 mmol/L (ref 22–32)
Calcium: 9.1 mg/dL (ref 8.9–10.3)
Chloride: 99 mmol/L (ref 98–111)
Creatinine, Ser: 1.57 mg/dL — ABNORMAL HIGH (ref 0.61–1.24)
GFR calc Af Amer: 50 mL/min — ABNORMAL LOW (ref >60)
GFR calc non Af Amer: 43 mL/min — ABNORMAL LOW (ref >60)
Glucose, Bld: 139 mg/dL — ABNORMAL HIGH (ref 70–99)
Potassium: 3.8 mmol/L (ref 3.5–5.1)
Sodium: 141 mmol/L (ref 135–145)

## 2019-08-03 LAB — BRAIN NATRIURETIC PEPTIDE: B Natriuretic Peptide: 70.1 pg/mL (ref 0.0–100.0)

## 2019-08-03 MED ORDER — TORSEMIDE 100 MG PO TABS: 100.0000 mg | ORAL_TABLET | Freq: Two times a day (BID) | ORAL | 3 refills | Status: DC

## 2019-08-03 MED ORDER — METOLAZONE 2.5 MG PO TABS: 2.5000 mg | ORAL_TABLET | ORAL | 3 refills | Status: DC

## 2019-08-03 MED ORDER — FUROSEMIDE 10 MG/ML IJ SOLN
80.0000 mg | Freq: Once | INTRAMUSCULAR | Status: AC
  Administered 2019-08-03: 80 mg via INTRAVENOUS
  Filled 2019-08-03: qty 8

## 2019-08-03 MED ORDER — METOLAZONE 2.5 MG PO TABS
2.5000 mg | ORAL_TABLET | Freq: Once | ORAL | Status: AC
  Administered 2019-08-03: 11:00:00 2.5 mg via ORAL
  Filled 2019-08-03 (×2): qty 1

## 2019-08-03 MED ORDER — POTASSIUM CHLORIDE CRYS ER 20 MEQ PO TBCR
40.0000 meq | EXTENDED_RELEASE_TABLET | Freq: Once | ORAL | Status: AC
  Administered 2019-08-03: 11:00:00 40 meq via ORAL
  Filled 2019-08-03: qty 2

## 2019-08-03 MED ORDER — METOLAZONE 2.5 MG PO TABS
2.5000 mg | ORAL_TABLET | Freq: Once | ORAL | Status: DC
  Filled 2019-08-03: qty 1

## 2019-08-03 NOTE — Patient Instructions (Addendum)
Today you have received IV 80 mg of IV furosemide, 2.5mg  tab of Metolazone and 40 meq of Potassium.  INCREASE Torsemide to 100mg  two times daily  INCREASE Metolazone 2.5mg  to one tab three times daily.  Lab work done today. We will notify you of any abnormal lab work. No news is good news.  Please follow up with the [facility] in 7 days.  At the [facility], you and your health needs are our priority. As part of our continuing mission to provide you with exceptional heart care, we have created designated Provider Care Teams. These Care Teams include your primary Cardiologist (physician) and Advanced Practice Providers (APPs- Physician Assistants and Nurse Practitioners) who all work together to provide you with the care you need, when you need it.   You may see any of the following providers on your designated Care Team at your next follow up: [name] Dr Glori Bickers . Dr Loralie Champagne . Darrick Grinder, NP   Please be sure to bring in all your medications bottles to every appointment.

## 2019-08-04 DIAGNOSIS — F39 Unspecified mood [affective] disorder: Secondary | ICD-10-CM | POA: Diagnosis not present

## 2019-08-04 DIAGNOSIS — N183 Chronic kidney disease, stage 3 (moderate): Secondary | ICD-10-CM | POA: Diagnosis not present

## 2019-08-04 DIAGNOSIS — I130 Hypertensive heart and chronic kidney disease with heart failure and stage 1 through stage 4 chronic kidney disease, or unspecified chronic kidney disease: Secondary | ICD-10-CM | POA: Diagnosis not present

## 2019-08-04 DIAGNOSIS — Z9049 Acquired absence of other specified parts of digestive tract: Secondary | ICD-10-CM | POA: Diagnosis not present

## 2019-08-04 DIAGNOSIS — Z7982 Long term (current) use of aspirin: Secondary | ICD-10-CM | POA: Diagnosis not present

## 2019-08-04 DIAGNOSIS — Z87891 Personal history of nicotine dependence: Secondary | ICD-10-CM | POA: Diagnosis not present

## 2019-08-04 DIAGNOSIS — M6281 Muscle weakness (generalized): Secondary | ICD-10-CM | POA: Diagnosis not present

## 2019-08-04 DIAGNOSIS — K219 Gastro-esophageal reflux disease without esophagitis: Secondary | ICD-10-CM | POA: Diagnosis not present

## 2019-08-04 DIAGNOSIS — F329 Major depressive disorder, single episode, unspecified: Secondary | ICD-10-CM | POA: Diagnosis not present

## 2019-08-04 DIAGNOSIS — Z981 Arthrodesis status: Secondary | ICD-10-CM | POA: Diagnosis not present

## 2019-08-04 DIAGNOSIS — G801 Spastic diplegic cerebral palsy: Secondary | ICD-10-CM | POA: Diagnosis not present

## 2019-08-04 DIAGNOSIS — G4730 Sleep apnea, unspecified: Secondary | ICD-10-CM | POA: Diagnosis not present

## 2019-08-04 DIAGNOSIS — G629 Polyneuropathy, unspecified: Secondary | ICD-10-CM | POA: Diagnosis not present

## 2019-08-04 DIAGNOSIS — E890 Postprocedural hypothyroidism: Secondary | ICD-10-CM | POA: Diagnosis not present

## 2019-08-04 DIAGNOSIS — G2581 Restless legs syndrome: Secondary | ICD-10-CM | POA: Diagnosis not present

## 2019-08-04 DIAGNOSIS — I700 Atherosclerosis of aorta: Secondary | ICD-10-CM | POA: Diagnosis not present

## 2019-08-04 DIAGNOSIS — N400 Enlarged prostate without lower urinary tract symptoms: Secondary | ICD-10-CM | POA: Diagnosis not present

## 2019-08-04 DIAGNOSIS — Z794 Long term (current) use of insulin: Secondary | ICD-10-CM | POA: Diagnosis not present

## 2019-08-04 DIAGNOSIS — E1122 Type 2 diabetes mellitus with diabetic chronic kidney disease: Secondary | ICD-10-CM | POA: Diagnosis not present

## 2019-08-04 DIAGNOSIS — F411 Generalized anxiety disorder: Secondary | ICD-10-CM | POA: Diagnosis not present

## 2019-08-04 DIAGNOSIS — Z79899 Other long term (current) drug therapy: Secondary | ICD-10-CM | POA: Diagnosis not present

## 2019-08-04 DIAGNOSIS — I5033 Acute on chronic diastolic (congestive) heart failure: Secondary | ICD-10-CM | POA: Diagnosis not present

## 2019-08-06 ENCOUNTER — Other Ambulatory Visit: Payer: Self-pay

## 2019-08-06 ENCOUNTER — Encounter (HOSPITAL_COMMUNITY): Payer: Self-pay

## 2019-08-06 ENCOUNTER — Ambulatory Visit (HOSPITAL_COMMUNITY)
Admission: EM | Admit: 2019-08-06 | Discharge: 2019-08-06 | Disposition: A | Discharge status: Home / Self Care | Payer: PPO | Attending: Family Medicine | Admitting: Family Medicine

## 2019-08-06 DIAGNOSIS — N390 Urinary tract infection, site not specified: Secondary | ICD-10-CM | POA: Insufficient documentation

## 2019-08-06 LAB — POCT URINALYSIS DIP (DEVICE)
Bilirubin Urine: NEGATIVE
Glucose, UA: NEGATIVE mg/dL
Hgb urine dipstick: NEGATIVE
Ketones, ur: NEGATIVE mg/dL
Nitrite: POSITIVE — AB
Protein, ur: NEGATIVE mg/dL
Specific Gravity, Urine: 1.02 (ref 1.005–1.030)
Urobilinogen, UA: 0.2 mg/dL (ref 0.0–1.0)
pH: 6.5 (ref 5.0–8.0)

## 2019-08-06 MED ORDER — CEPHALEXIN 500 MG PO CAPS: 500.0000 mg | ORAL_CAPSULE | Freq: Four times a day (QID) | ORAL | 0 refills | Status: AC

## 2019-08-06 NOTE — ED Notes (Signed)
Family requesting urine culture to also be sent today r/t hx of needing IV antibiotics at home.

## 2019-08-06 NOTE — Discharge Instructions (Signed)
Culture is in process.  Will notify you of any positive findings and if any changes to treatment are needed.   Start antibiotics.  Any worsening of symptoms please go to the ER.

## 2019-08-06 NOTE — ED Triage Notes (Signed)
Patient presents to Urgent Care with complaints of possible UTI since yesterday evening. Patient reports he has burning urination and nausea, is wheelchair bound, daughter in law bedside.

## 2019-08-06 NOTE — ED Provider Notes (Signed)
[place]    CSN: [id number] Arrival date & time: 08/06/19  1634      History   Chief Complaint Chief Complaint  Patient presents with  . Urinary Tract Infection    HPI Travis [name] is a 73 y.o. male.   Travis [name] presents with family with complaints of burning/ache sensation with urination as well as associated mild nausea. No fevers. No back pain, no blood in urine. No known fevers. No increased confusion or mental status change. No new weakness. History of urosepsis which has led to respiratory distress, even with recent hospitalization, 7/22 in which he was treated for a UTI. He does have some incontinence. He is in a wheelchair here today. He is O2 dependent. Family member states that he has received IV rocephin in the past, uncertain of any specific antibiotic resistance. History  Of CHF, cervical myelopathy, COPD, DM, polyneuropathy.    ROS per HPI, negative if not otherwise mentioned.      Past Medical History:  Diagnosis Date  . Cervical myelopathy (HCC)    with myelomalacia at C5-6 and C3-4  . CHF (congestive heart failure) ([place])   . Constipation - functional    controlled with miralax  . COPD (chronic obstructive pulmonary disease) (HCC)    mild  . DJD (degenerative joint disease)    WHOLE SPINE  . Dyspnea   . Gait disorder   . Heart murmur    years ago   . Hypertension   . Hypothyroidism    Low d/t treatment of hyperthyroidism  . Lumbosacral spinal stenosis 03/13/2014  . On home oxygen therapy    "2.5L all the time" (06/29/2018)  . Peripheral neuropathy   . Pneumonia   . PONV (postoperative nausea and vomiting)   . RBBB   . Renal disorder    acute kidney failure  . Restless legs syndrome 03/14/2015  . RLS (restless legs syndrome)   . Sleep apnea    DX  2009/2010 study done at [facility]  . 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 Type II diabetes mellitus ([place])    diet controlled    Patient Active Problem List   Diagnosis Date  Noted  . Acute metabolic encephalopathy 44/02/4741  . Acute CHF (congestive heart failure) ([place]) 07/13/2019  . Anasarca 05/01/2019  . Cellulitis of lower extremity 05/01/2019  . COPD (chronic obstructive pulmonary disease) ([place]) 05/01/2019  . Depression 05/01/2019  . RLS (restless legs syndrome) 05/01/2019  . Type 2 diabetes mellitus with stage 3 chronic kidney disease, with long-term current use of insulin ([place]) 04/15/2019  . Insulin dependent diabetes mellitus ([place]) 04/15/2019  . Pressure injury of skin 11/21/2018  . Complicated urinary tract infection 11/20/2018  . Acute encephalopathy 06/28/2018  . Oxygen desaturation   . Supplemental oxygen dependent   . Type 2 diabetes mellitus with hyperglycemia, with long-term current use of insulin ([place])   . Labile blood pressure   . Labile blood glucose   . Myelopathy ([place]) 04/16/2018  . Benign essential HTN   . Benign prostatic hyperplasia with urinary retention   . Acute on chronic respiratory failure with hypoxia and hypercapnia (HCC)   . DDD (degenerative disc disease), cervical   . Obesity, Class III, BMI 40-49.9 (morbid obesity) ([place])   . S/P lumbar spinal fusion 04/08/2018  . 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 Myelopathy, spondylogenic, cervical 11/11/2017  . Excessive daytime sleepiness 11/11/2017  . 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 Debility 10/08/2017  . Stage 3 chronic kidney disease ([place])   . Spinal stenosis of lumbar region   . Acute blood loss anemia   . Urinary tract infection without hematuria   . Acute on chronic diastolic CHF (congestive heart failure) ([place])   . Elevated troponin I level   . Hypothyroid 08/19/2015  . Restless legs syndrome 03/14/2015  . Shortness of breath 03/09/2015  . Sinus bradycardia 09/21/2014  . Thoracic spondylosis with myelopathy 09/13/2014  . Cervical arthritis with myelopathy 07/18/2014  . Constipation - functional   . Sepsis ([place])  07/10/2014  . OSA on CPAP 07/10/2014  . Edema leg 07/10/2014  . Lumbosacral spinal stenosis 03/13/2014  . Other vitamin B12 deficiency anemia 07/13/2013  . 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 Polyneuropathy in other diseases classified elsewhere ([place]) 02/17/2013  . Other general symptoms(780.99) 02/17/2013  . Abnormality of gait 02/17/2013  . Cervical spondylosis with myelopathy 02/17/2013  . Back ache 09/15/2012    Past Surgical History:  Procedure Laterality Date  . BACK SURGERY     6 surgeries  . CARDIAC CATHETERIZATION [name] 09/07/2015   Procedure: Right/Left Heart Cath and Coronary Angiography;  Surgeon: Peter M Martinique, MD;  Location: [place];  Service: Cardiovascular;  Laterality: [name];  . CHOLECYSTECTOMY    . EYE SURGERY     BIL CATARACTS  . LUMBAR LAMINECTOMY/DECOMPRESSION MICRODISCECTOMY  07/14/2012   Procedure: LUMBAR LAMINECTOMY/DECOMPRESSION MICRODISCECTOMY 2 LEVELS;  Surgeon: Eustace Moore, MD;  Location: [facility];  Service: Neurosurgery;  Laterality: Left;  Lumbar two three laminectomy, left thoracic four five transpedicular diskectomy  . POSTERIOR CERVICAL FUSION/FORAMINOTOMY  04/08/2018   Procedure: Laminectomy and Foraminotomy - Lumbar Four-Lumbar Five, instrumented posterolateral fusion Lumbar Four- Five, Laminectomy and Foraminotomy - Thoracic One-Thoracic Two;  Surgeon: Eustace Moore, MD;  Location: [facility] OR;  Service: Neurosurgery;;  posterior  . POSTERIOR LUMBAR FUSION  04/08/2018   Laminectomy and Foraminotomy - Lumbar Four-Lumbar Five, instrumented posterolateral fusion Lumbar Four- Five, Laminectomy and Foraminotomy - Thoracic One-Thoracic Two  . POSTERIOR LUMBAR FUSION  04/08/2018   Laminectomy and Foraminotomy - Lumbar Four-Lumbar Five, instrumented posterolateral fusion Lumbar Four- Five, Laminectomy and Foraminotomy - Thoracic One-Thoracic Two  . RIGHT HEART CATH [name] 07/18/2019   Procedure: RIGHT HEART CATH;  Surgeon:  Jolaine Artist, MD;  Location: [place] [facility];  Service: Cardiovascular;  Laterality: [name];       Home Medications    Prior to Admission medications   Medication Sig Start Date End Date Taking? Authorizing Provider  aspirin EC 81 MG tablet Take 81 mg by mouth daily.    [provider]  atorvastatin (LIPITOR) 20 MG tablet Take 20 mg by mouth every evening.     [provider]  Blood Glucose Monitoring Suppl (ONETOUCH VERIO) w/Device KIT 1 kit by Does not apply route 4 (four) times daily. Use as directed to test blood sugars 4 times daily DX E11.42 03/04/19   Shamleffer, Melanie Crazier, MD  cefdinir (OMNICEF) 300 MG capsule Take 300 mg by mouth 2 (two) times daily.    [provider]  cephALEXin (KEFLEX) 500 MG capsule Take 1 capsule (500 mg total) by mouth 4 (four) times daily for 10 days. 08/06/19 08/16/19  Zigmund Gottron, NP  Continuous Blood Gluc Receiver (FREESTYLE LIBRE 14 DAY READER) DEVI 1 kit by Does not apply route 3 (three) times daily. 02/07/19   Shamleffer, Melanie Crazier,  MD  Continuous Blood Gluc Sensor (FREESTYLE LIBRE 14 DAY SENSOR) MISC 1 kit by Does not apply route 3 (three) times daily. 02/07/19   Shamleffer, Melanie Crazier, MD  escitalopram (LEXAPRO) 20 MG tablet Take 20 mg by mouth daily.    [provider]  gabapentin (NEURONTIN) 400 MG capsule Take 400 mg by mouth 3 (three) times daily.    [provider]  glucose blood test strip Use as instructed to test blood sugars 4 times daily DX E11.42 03/04/19   Shamleffer, Melanie Crazier, MD  hydrALAZINE (APRESOLINE) 25 MG tablet Take 25 mg by mouth 3 (three) times daily.    [provider]  insulin aspart (NOVOLOG FLEXPEN) 100 UNIT/ML FlexPen Inject 18 Units into the skin 3 (three) times daily with meals. 03/07/19   Shamleffer, Melanie Crazier, MD  Insulin Glargine (LANTUS SOLOSTAR) 100 UNIT/ML Solostar Pen Inject 60 Units into the skin at bedtime. 03/04/19   Shamleffer,  Melanie Crazier, MD  Insulin Pen Needle (BD PEN NEEDLE MICRO U/F) 32G X 6 MM MISC Four times daily 02/04/19   Shamleffer, Melanie Crazier, MD  Insulin Pen Needle (BD PEN NEEDLE MICRO U/F) 32G X 6 MM MISC 4 times a day 03/04/19   Shamleffer, Melanie Crazier, MD  Lancets [facility] ULTRASOFT) lancets Use as instructed to test blood sugars 4 times daily DX E11.42 03/04/19   Shamleffer, Melanie Crazier, MD  levothyroxine (SYNTHROID, LEVOTHROID) 175 MCG tablet Take 1 tablet (175 mcg total) by mouth daily. 05/12/18   Angiulli, Lavon Paganini, [name]  metolazone (ZAROXOLYN) 2.5 MG tablet Take 1 tablet (2.5 mg total) by mouth every Monday, Wednesday, and Friday. 08/03/19   Clegg, Amy D, NP  multivitamin (ONE-A-DAY MEN'S) TABS tablet Take 1 tablet by mouth at bedtime.    [provider]  OXYGEN Inhale 2-3 Units into the lungs at bedtime.    [provider]  polyethylene glycol (MIRALAX / GLYCOLAX) packet Take 17 g by mouth daily as needed. For constipation Patient taking differently: Take 17 g by mouth at bedtime.  08/09/14   Love, Ivan Anchors, [name]  potassium chloride SA (K-DUR) 20 MEQ tablet Take 1 tablet (20 mEq total) by mouth 2 (two) times daily. Take 1 tab only on days you take Metolazone 05/11/19   Mercy Riding, MD  pramipexole (MIRAPEX) 0.5 MG tablet One tablet in the morning and 2 in the evening Patient taking differently: Take 0.5 mg by mouth 2 (two) times daily.  11/24/18   Kathrynn Ducking, MD  spironolactone (ALDACTONE) 25 MG tablet Take 1 tablet (25 mg total) by mouth daily. 10/29/18   Nahser, Wonda Cheng, MD  tamsulosin (FLOMAX) 0.4 MG CAPS capsule Take 1 capsule (0.4 mg total) by mouth daily. Patient taking differently: Take 0.4 mg by mouth at bedtime.  05/12/18   Angiulli, Lavon Paganini, [name]  torsemide (DEMADEX) 100 MG tablet Take 1 tablet (100 mg total) by mouth 2 (two) times daily. 08/04/19   Clegg, Amy D, NP  TOVIAZ 8 MG TB24 tablet Take 1 tablet (8 mg total) by mouth daily. 05/12/18   Angiulli, Lavon Paganini, [name]    Family History Family History  Problem Relation Age of Onset  . Polycythemia Mother   . Diabetes Father   . Hypertension Father   . Heart disease Father   . CAD Other        1 of his 4 brothers and father has CAD    Social History Social History   Tobacco Use  .  Smoking status: Former Smoker    Packs/day: 2.00    Years: 20.00    Pack years: 40.00    Types: Cigarettes    Quit date: 06/28/1985    Years since quitting: 34.1  . Smokeless tobacco: Former Systems developer    Quit date: 06/28/1985  Substance Use Topics  . Alcohol use: No  . Drug use: No     Allergies   Amlodipine, Plendil [felodipine], Compazine [prochlorperazine], Cymbalta [duloxetine hcl], Jardiance [empagliflozin], Other, Levofloxacin in d5w, and Septra [sulfamethoxazole-trimethoprim]   Review of Systems Review of Systems   Physical Exam Triage Vital Signs ED Triage Vitals  Enc Vitals Group     BP 08/06/19 1729 (!) 157/91     Pulse Rate 08/06/19 1729 97     Resp 08/06/19 1729 (!) 24     Temp 08/06/19 1729 98.6 F (37 C)     Temp Source 08/06/19 1729 Oral     SpO2 08/06/19 1729 96 %     Weight --      Height --      Head Circumference --      Peak Flow --      Pain Score 08/06/19 1727 0     Pain Loc --      Pain Edu? --      Excl. in [place]? --    No data found.  Updated Vital Signs BP (!) 157/91 (BP Location: Left Arm)   Pulse 97   Temp 98.6 F (37 C) (Oral)   Resp (!) 24   SpO2 96%   Visual Acuity Right Eye Distance:   Left Eye Distance:   Bilateral Distance:    Right Eye Near:   Left Eye Near:    Bilateral Near:     Physical Exam Constitutional:      Appearance: He is well-developed.  Cardiovascular:     Rate and Rhythm: Normal rate.  Pulmonary:     Effort: Pulmonary effort is normal.     Comments: O2 by nasal canula in place  Abdominal:     Tenderness: There is no abdominal tenderness. There is no right CVA tenderness or left CVA tenderness.  Skin:    General: Skin is  warm and dry.  Neurological:     Mental Status: He is alert and oriented to person, place, and time.     Comments: Answering all questions appropriately with clear speech      UC Treatments / Results  Labs (all labs ordered are listed, but only abnormal results are displayed) Labs Reviewed  POCT URINALYSIS DIP (DEVICE) - Abnormal; Notable for the following components:      Result Value   Nitrite POSITIVE (*)    Leukocytes,Ua SMALL (*)    All other components within normal limits  URINE CULTURE    EKG   Radiology No results found.  Procedures Procedures (including critical care time)  Medications Ordered in UC Medications - No data to display  Initial Impression / Assessment and Plan / UC Course  I have reviewed the triage vital signs and the nursing notes.  Pertinent labs & imaging results that were available during my care of the patient were reviewed by me and considered in my medical decision making (see chart for details).     Urine culture collected and pending. No mental status changes, alert, oriented. Vitals stable at this time. Onset yesterday. Per chart review no recent urine cultures which are helpful. Patient has been on rocephin in the past, opted to  start keflex qid. Strict er precautions discussed. Patient and family agreeable to following up with PCP on Monday.  Final Clinical Impressions(s) / UC Diagnoses   Final diagnoses:  Lower urinary tract infectious disease     Discharge Instructions     Culture is in process.  Will notify you of any positive findings and if any changes to treatment are needed.   Start antibiotics.  Any worsening of symptoms please go to the ER.    ED Prescriptions    Medication Sig Dispense Auth. Provider   cephALEXin (KEFLEX) 500 MG capsule Take 1 capsule (500 mg total) by mouth 4 (four) times daily for 10 days. 40 capsule Zigmund Gottron, NP     Controlled Substance Prescriptions [place] Controlled Substance Registry  consulted? Not Applicable   Zigmund Gottron, NP 08/06/19 2120

## 2019-08-07 LAB — URINE CULTURE

## 2019-08-11 ENCOUNTER — Encounter (HOSPITAL_COMMUNITY): Payer: Self-pay

## 2019-08-11 ENCOUNTER — Other Ambulatory Visit: Payer: Self-pay

## 2019-08-11 ENCOUNTER — Ambulatory Visit (HOSPITAL_COMMUNITY)
Admission: RE | Admit: 2019-08-11 | Discharge: 2019-08-11 | Disposition: A | Discharge status: Home / Self Care | Payer: PPO | Source: Ambulatory Visit | Attending: Cardiology | Admitting: Cardiology

## 2019-08-11 VITALS — BP 160/90 | HR 65 | Wt 295.5 lb

## 2019-08-11 DIAGNOSIS — Z794 Long term (current) use of insulin: Secondary | ICD-10-CM | POA: Insufficient documentation

## 2019-08-11 DIAGNOSIS — I2510 Atherosclerotic heart disease of native coronary artery without angina pectoris: Secondary | ICD-10-CM | POA: Diagnosis not present

## 2019-08-11 DIAGNOSIS — J9611 Chronic respiratory failure with hypoxia: Secondary | ICD-10-CM | POA: Diagnosis not present

## 2019-08-11 DIAGNOSIS — I10 Essential (primary) hypertension: Secondary | ICD-10-CM | POA: Diagnosis not present

## 2019-08-11 DIAGNOSIS — E039 Hypothyroidism, unspecified: Secondary | ICD-10-CM | POA: Diagnosis not present

## 2019-08-11 DIAGNOSIS — Z7982 Long term (current) use of aspirin: Secondary | ICD-10-CM | POA: Diagnosis not present

## 2019-08-11 DIAGNOSIS — Z87891 Personal history of nicotine dependence: Secondary | ICD-10-CM | POA: Diagnosis not present

## 2019-08-11 DIAGNOSIS — I4510 Unspecified right bundle-branch block: Secondary | ICD-10-CM | POA: Diagnosis not present

## 2019-08-11 DIAGNOSIS — Z888 Allergy status to other drugs, medicaments and biological substances status: Secondary | ICD-10-CM | POA: Diagnosis not present

## 2019-08-11 DIAGNOSIS — I130 Hypertensive heart and chronic kidney disease with heart failure and stage 1 through stage 4 chronic kidney disease, or unspecified chronic kidney disease: Secondary | ICD-10-CM | POA: Insufficient documentation

## 2019-08-11 DIAGNOSIS — Z79890 Hormone replacement therapy (postmenopausal): Secondary | ICD-10-CM | POA: Insufficient documentation

## 2019-08-11 DIAGNOSIS — E1165 Type 2 diabetes mellitus with hyperglycemia: Secondary | ICD-10-CM | POA: Insufficient documentation

## 2019-08-11 DIAGNOSIS — G2581 Restless legs syndrome: Secondary | ICD-10-CM | POA: Diagnosis not present

## 2019-08-11 DIAGNOSIS — Z881 Allergy status to other antibiotic agents status: Secondary | ICD-10-CM | POA: Insufficient documentation

## 2019-08-11 DIAGNOSIS — Z882 Allergy status to sulfonamides status: Secondary | ICD-10-CM | POA: Diagnosis not present

## 2019-08-11 DIAGNOSIS — N183 Chronic kidney disease, stage 3 unspecified: Secondary | ICD-10-CM

## 2019-08-11 DIAGNOSIS — I5032 Chronic diastolic (congestive) heart failure: Secondary | ICD-10-CM | POA: Diagnosis not present

## 2019-08-11 DIAGNOSIS — J449 Chronic obstructive pulmonary disease, unspecified: Secondary | ICD-10-CM | POA: Diagnosis not present

## 2019-08-11 DIAGNOSIS — E1142 Type 2 diabetes mellitus with diabetic polyneuropathy: Secondary | ICD-10-CM | POA: Insufficient documentation

## 2019-08-11 DIAGNOSIS — Z9981 Dependence on supplemental oxygen: Secondary | ICD-10-CM | POA: Diagnosis not present

## 2019-08-11 DIAGNOSIS — Z79899 Other long term (current) drug therapy: Secondary | ICD-10-CM | POA: Diagnosis not present

## 2019-08-11 DIAGNOSIS — E1122 Type 2 diabetes mellitus with diabetic chronic kidney disease: Secondary | ICD-10-CM | POA: Diagnosis not present

## 2019-08-11 DIAGNOSIS — Z8249 Family history of ischemic heart disease and other diseases of the circulatory system: Secondary | ICD-10-CM | POA: Insufficient documentation

## 2019-08-11 DIAGNOSIS — G4733 Obstructive sleep apnea (adult) (pediatric): Secondary | ICD-10-CM

## 2019-08-11 LAB — BASIC METABOLIC PANEL
Anion gap: 16 — ABNORMAL HIGH (ref 5–15)
BUN: 96 mg/dL — ABNORMAL HIGH (ref 8–23)
CO2: 32 mmol/L (ref 22–32)
Calcium: 9.1 mg/dL (ref 8.9–10.3)
Chloride: 87 mmol/L — ABNORMAL LOW (ref 98–111)
Creatinine, Ser: 2.03 mg/dL — ABNORMAL HIGH (ref 0.61–1.24)
GFR calc Af Amer: 36 mL/min — ABNORMAL LOW (ref >60)
GFR calc non Af Amer: 31 mL/min — ABNORMAL LOW (ref >60)
Glucose, Bld: 180 mg/dL — ABNORMAL HIGH (ref 70–99)
Potassium: 3.5 mmol/L (ref 3.5–5.1)
Sodium: 135 mmol/L (ref 135–145)

## 2019-08-11 MED ORDER — HYDRALAZINE HCL 25 MG PO TABS: 37.5000 mg | ORAL_TABLET | Freq: Three times a day (TID) | ORAL | 5 refills | Status: DC

## 2019-08-11 NOTE — Progress Notes (Signed)
   [facility]  ID:  Travis [name], DOB 07/07/1946, MRN [id number]  Location: Home  Provider location: [address], [place] Type of Visit: Established patient   PCP:  Ehinger, Robert, MD  Cardiologist:  Philip Nahser, MD Primary HF: Dr Bensimhon  Chief Complaint: Heart Failure   History of Present Illness: Travis [name] is a 72 y.o. 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  Admitted 5/2-5/13/20 with volume overload. Diuresed with lasix drip and metolazone. Had AKI that improved with switching back to oral diuretics. Also treated for cellulitis with doxy. PT recommended SNF, but he refused. DC weight 281 lbs. He was discharged on torsemide 80 mg daily with 2.5 mg metolazone weekly. Discharged with [facility].   Admitted 07/13/19 with A/C diastolic HF. Diuresed with lasix drip and later transitioned to torsemide 80 mg twice a day + metolazone twice a week.Discharge weight was 285 pounds.  Re-admitted 07/21/19 with AMS. He was treated for UTI and sent home the next day.  Today he returns for HF follow up.Last week he was given IV lasix and says he a good response. Diuretics also increased torsemide 100/100 + metolazone 3x per week. Overall feeling much better. SOB with exertion but improved some.  Denies PND/Orthopnea. Appetite ok. Tries to limit salty foods. No fever or chills. Not able to weigh at home due to balance issues. Taking all medications. Followed by [facility].     RHC 7/20//20  RA = 8 RV = 42/9 PA = 44/11 (26) Mild pulmonary hypertension.  PCW = 13 Fick cardiac output/index = 8.8/3.5 PVR = 1.5 WU Ao sat = 93% PA sat = 79%, 71%  RHC 06/30/2019 RA = 8 RV = 42/9 PA = 44/11 (26) PCW = 13 Fick cardiac output/index = 8.8/3.5 PVR = 1.5 WU Ao sat = 93% PA sat = 79%, 71% Assessment: 1. Mild PAH 2. Well compensated filling pressures  and output Metolazone to twice a week (Monday and Friday). Dry weight now 285-287.     Past Medical History:  Diagnosis Date  . Cervical myelopathy (HCC)    with myelomalacia at C5-6 and C3-4  . CHF (congestive heart failure) (HCC)   . Constipation - functional    controlled with miralax  . COPD (chronic obstructive pulmonary disease) (HCC)    mild  . DJD (degenerative joint disease)    WHOLE SPINE  . Dyspnea   . Gait disorder   . Heart murmur    years ago   . Hypertension   . Hypothyroidism    Low d/t treatment of hyperthyroidism  . Lumbosacral spinal stenosis 03/13/2014  . On home oxygen therapy    "2.5L all the time" (06/29/2018)  . Peripheral neuropathy   . Pneumonia   . PONV (postoperative nausea and vomiting)   . RBBB   . Renal disorder    acute kidney failure  . Restless legs syndrome 03/14/2015  . RLS (restless legs syndrome)   . Sleep apnea    DX  2009/2010 study done at [facility]  . 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 Type II diabetes mellitus (HCC)    diet controlled   Past Surgical History:  Procedure Laterality Date  . BACK SURGERY     6 surgeries  . CARDIAC CATHETERIZATION [name] 09/07/2015   Procedure: Right/Left Heart Cath and Coronary Angiography;  Surgeon: Peter M   Martinique, MD;  Location: [facility];  Service: Cardiovascular;  Laterality: [name];  . CHOLECYSTECTOMY    . EYE SURGERY     BIL CATARACTS  . LUMBAR LAMINECTOMY/DECOMPRESSION MICRODISCECTOMY  07/14/2012   Procedure: LUMBAR LAMINECTOMY/DECOMPRESSION MICRODISCECTOMY 2 LEVELS;  Surgeon: Eustace Moore, MD;  Location: [facility];  Service: Neurosurgery;  Laterality: Left;  Lumbar two three laminectomy, left thoracic four five transpedicular diskectomy  . POSTERIOR CERVICAL FUSION/FORAMINOTOMY  04/08/2018   Procedure: Laminectomy and Foraminotomy - Lumbar Four-Lumbar Five, instrumented posterolateral fusion Lumbar Four- Five, Laminectomy and Foraminotomy - Thoracic One-Thoracic Two;  Surgeon: Eustace Moore, MD;  Location: [facility] OR;  Service: Neurosurgery;;  posterior  . POSTERIOR LUMBAR FUSION  04/08/2018   Laminectomy and Foraminotomy - Lumbar Four-Lumbar Five, instrumented posterolateral fusion Lumbar Four- Five, Laminectomy and Foraminotomy - Thoracic One-Thoracic Two  . POSTERIOR LUMBAR FUSION  04/08/2018   Laminectomy and Foraminotomy - Lumbar Four-Lumbar Five, instrumented posterolateral fusion Lumbar Four- Five, Laminectomy and Foraminotomy - Thoracic One-Thoracic Two  . RIGHT HEART CATH [name] 07/18/2019   Procedure: RIGHT HEART CATH;  Surgeon: Jolaine Artist, MD;  Location: [facility];  Service: Cardiovascular;  Laterality: [name];     Current Outpatient Medications  Medication Sig Dispense Refill  . aspirin EC 81 MG tablet Take 81 mg by mouth daily.    [name] atorvastatin (LIPITOR) 20 MG tablet Take 20 mg by mouth every evening.     . Blood Glucose Monitoring Suppl (ONETOUCH VERIO) w/Device KIT 1 kit by Does not apply route 4 (four) times daily. Use as directed to test blood sugars 4 times daily DX E11.42 1 kit 0  . cefdinir (OMNICEF) 300 MG capsule Take 300 mg by mouth 2 (two) times daily.    . cephALEXin (KEFLEX) 500 MG capsule Take 1 capsule (500 mg total) by mouth 4 (four) times daily for 10 days. 40 capsule 0  . Continuous Blood Gluc Receiver (FREESTYLE LIBRE 14 DAY READER) DEVI 1 kit by Does not apply route 3 (three) times daily. 1 Device 0  . Continuous Blood Gluc Sensor (FREESTYLE LIBRE 14 DAY SENSOR) MISC 1 kit by Does not apply route 3 (three) times daily. 3 each 11  . escitalopram (LEXAPRO) 20 MG tablet Take 20 mg by mouth daily.    [name] gabapentin (NEURONTIN) 400 MG capsule Take 400 mg by mouth 3 (three) times daily.    [name] glucose blood test strip Use as instructed to test blood sugars 4 times daily DX E11.42 100 each 12  . hydrALAZINE (APRESOLINE) 25 MG tablet Take 25 mg by mouth 3 (three) times daily.    . insulin aspart (NOVOLOG FLEXPEN) 100 UNIT/ML FlexPen Inject 18  Units into the skin 3 (three) times daily with meals. 15 mL 11  . Insulin Glargine (LANTUS SOLOSTAR) 100 UNIT/ML Solostar Pen Inject 60 Units into the skin at bedtime. 15 mL 11  . Insulin Pen Needle (BD PEN NEEDLE MICRO U/F) 32G X 6 MM MISC Four times daily 150 each 6  . Insulin Pen Needle (BD PEN NEEDLE MICRO U/F) 32G X 6 MM MISC 4 times a day 150 each 11  . Lancets (ONETOUCH ULTRASOFT) lancets Use as instructed to test blood sugars 4 times daily DX E11.42 100 each 12  . levothyroxine (SYNTHROID, LEVOTHROID) 175 MCG tablet Take 1 tablet (175 mcg total) by mouth daily. 30 tablet 2  . metolazone (ZAROXOLYN) 2.5 MG tablet Take 1 tablet (2.5 mg total) by mouth every  Monday, Wednesday, and Friday. 15 tablet 3  . multivitamin (ONE-A-DAY MEN'S) TABS tablet Take 1 tablet by mouth at bedtime.    . OXYGEN Inhale 2-3 Units into the lungs at bedtime.    . polyethylene glycol (MIRALAX / GLYCOLAX) packet Take 17 g by mouth daily as needed. For constipation (Patient taking differently: Take 17 g by mouth at bedtime. ) 14 each 0  . potassium chloride SA (K-DUR) 20 MEQ tablet Take 1 tablet (20 mEq total) by mouth 2 (two) times daily. Take 1 tab only on days you take Metolazone 60 tablet 0  . pramipexole (MIRAPEX) 0.5 MG tablet One tablet in the morning and 2 in the evening (Patient taking differently: Take 0.5 mg by mouth 2 (two) times daily. ) 270 tablet 1  . spironolactone (ALDACTONE) 25 MG tablet Take 1 tablet (25 mg total) by mouth daily. 90 tablet 3  . tamsulosin (FLOMAX) 0.4 MG CAPS capsule Take 1 capsule (0.4 mg total) by mouth daily. (Patient taking differently: Take 0.4 mg by mouth at bedtime. ) 30 capsule 0  . torsemide (DEMADEX) 100 MG tablet Take 1 tablet (100 mg total) by mouth 2 (two) times daily. 180 tablet 3  . TOVIAZ 8 MG TB24 tablet Take 1 tablet (8 mg total) by mouth daily. 30 tablet 11   No current facility-administered medications for this encounter.     Allergies:   Amlodipine, Plendil  [felodipine], Compazine [prochlorperazine], Cymbalta [duloxetine hcl], Jardiance [empagliflozin], Other, Levofloxacin in d5w, and Septra [sulfamethoxazole-trimethoprim]   Social History:  The patient  reports that he quit smoking about 34 years ago. His smoking use included cigarettes. He has a 40.00 pack-year smoking history. He quit smokeless tobacco use about 34 years ago. He reports that he does not drink alcohol or use drugs.   Family History:  The patient's family history includes CAD in an other family member; Diabetes in his father; Heart disease in his father; Hypertension in his father; Polycythemia in his mother.   ROS:  Please see the history of present illness.   All other systems are personally reviewed and negative.    Wt Readings from Last 3 Encounters:  08/11/19 134 kg (295 lb 8 oz)  08/03/19 136 kg (299 lb 12.8 oz)  07/20/19 129.3 kg (285 lb)   Today's Vitals   08/11/19 1338  BP: (!) 160/90  Pulse: 65  SpO2: 97%  Weight: 134 kg (295 lb 8 oz)   Body mass index is 38.99 kg/m.  Exam:   General:  Appears chronically ill.  No resp difficulty. Arrived in a wheel chair.  HEENT: normal Neck: supple. JVP 8-9 . Carotids 2+ bilat; no bruits. No lymphadenopathy or thryomegaly appreciated. Cor: PMI nondisplaced. Regular rate & rhythm. No rubs, gallops or murmurs. Lungs: clear Abdomen: soft, nontender, nondistended. No hepatosplenomegaly. No bruits or masses. Good bowel sounds. Extremities: no cyanosis, clubbing, rash, R and LLE trace -1+edema Neuro: alert & orientedx3, cranial nerves grossly intact. moves all 4 extremities w/o difficulty. Affect pleasant  Recent Labs: 07/14/2019: ALT 14; TSH 1.243 07/21/2019: Hemoglobin 13.0; Magnesium 3.1; Platelets 221 08/03/2019: B Natriuretic Peptide 70.1; BUN 58; Creatinine, Ser 1.57; Potassium 3.8; Sodium 141  Personally reviewed  Wt Readings from Last 3 Encounters:  08/11/19 134 kg (295 lb 8 oz)  08/03/19 136 kg (299 lb 12.8 oz)   07/20/19 129.3 kg (285 lb)    ASSESSMENT AND PLAN:  1.Chronic Diastolic Heart Failure with R>>L symptoms - ECHO 06/29/2018 EF 55-60% Grade IDD  -  Suspect due to R-sided HF from OSA, OHS and COPD - PYP scan 10/2018 with Grade 1, H/CLL 1.45. Reviewed by Dr. Mauricia Area determined to be negative.Myeloma panel with no M-spike, normal IFE. - Echo 5/4 EF 60-65%, normal RV NYHA IIIb. Volume status improved. Continue current diuretic regimen. -   torsemide to 100 mg twice a day and increase metolazone to M-W-F.  -Continue spiro 25 daily - Intolerant to Jardiance with UTI and rash. Check BMET today.   2.Chronic Hypoxic Respiratory Failure/COPD - Continue BiPAP qHS with 3 L O2.  -Continue nightly.   3. CKD Stage III Creatinine baseline ~1.8  -Check BMET today.   4. OSA - Continue Bipap nightly.   5. CAD - Non-obstructive. LHC 08/2015: LM 40, LAD distal 40, LCx ostial 50, RCA proximal 40 -No chest pain  - On ASA/statin - Off b-blocker   6. DM - Uncontrolled. A1C is 10.0 -Intolerant to [place] with UTI and rash. - Per PCP.  7.Deconditioning - Followed by [facility]  8. HTN Elevated today. Increase hydralazine to 37.5 mg three times a day.   Follow up in 2 weeks to reassess volume status. Check BMET  Suspect we will need to give him IV lasix in the office again.     Darnell Level, NP  08/11/2019 2:10 PM   [facility] [address] [facility] [place] [address] [phone] (office) [phone] (fax)

## 2019-08-11 NOTE — Patient Instructions (Addendum)
Lab work was done today. We will contact you only if your labs are abnormal.   INCREASE Hydralazine to 37.5mg (1 and a half tab) by mouth three times a day.  Your physician recommends that you schedule a follow-up appointment in: 2 weeks with Darrick Grinder, NP  At the [facility], you and your health needs are our priority. As part of our continuing mission to provide you with exceptional heart care, we have created designated Provider Care Teams. These Care Teams include your primary Cardiologist (physician) and Advanced Practice Providers (APPs- Physician Assistants and Nurse Practitioners) who all work together to provide you with the care you need, when you need it.   You may see any of the following providers on your designated Care Team at your next follow up: [name] Dr Glori Bickers . Dr Loralie Champagne . Darrick Grinder, NP   Please be sure to bring in all your medications bottles to every appointment.

## 2019-08-16 DIAGNOSIS — B351 Tinea unguium: Secondary | ICD-10-CM | POA: Diagnosis not present

## 2019-08-16 DIAGNOSIS — E119 Type 2 diabetes mellitus without complications: Secondary | ICD-10-CM | POA: Diagnosis not present

## 2019-08-20 DIAGNOSIS — I5033 Acute on chronic diastolic (congestive) heart failure: Secondary | ICD-10-CM | POA: Diagnosis not present

## 2019-08-20 DIAGNOSIS — M4800 Spinal stenosis, site unspecified: Secondary | ICD-10-CM | POA: Diagnosis not present

## 2019-08-20 DIAGNOSIS — G9340 Encephalopathy, unspecified: Secondary | ICD-10-CM | POA: Diagnosis not present

## 2019-08-22 ENCOUNTER — Telehealth: Payer: Self-pay | Admitting: Cardiovascular Disease

## 2019-08-22 ENCOUNTER — Other Ambulatory Visit (HOSPITAL_COMMUNITY): Payer: Self-pay | Admitting: *Deleted

## 2019-08-22 MED ORDER — METOLAZONE 2.5 MG PO TABS: 2.5000 mg | ORAL_TABLET | ORAL | 3 refills | Status: DC

## 2019-08-22 MED ORDER — HYDRALAZINE HCL 25 MG PO TABS: 37.5000 mg | ORAL_TABLET | Freq: Three times a day (TID) | ORAL | 5 refills | Status: AC

## 2019-08-22 MED ORDER — POTASSIUM CHLORIDE CRYS ER 20 MEQ PO TBCR: 20.0000 meq | EXTENDED_RELEASE_TABLET | Freq: Two times a day (BID) | ORAL | 0 refills | Status: DC

## 2019-08-22 NOTE — Telephone Encounter (Signed)
Spoke with Cecille Rubin, clinical pharmacy assistant at Dr. Raphael Gibney office. She states the pharmacist is working with the patient to manage the medications and has advised her that some of his medication refills have not gone to his new pharmacy, [facility]. I reviewed our medication list with Cecille Rubin and she requests that the Hydralazine 37.5 mg TID Rx be sent to [facility] with a note to them not to fill it because the pt states he has plenty. I have completed this request and Cecille Rubin thanked me for our help.

## 2019-08-22 NOTE — Telephone Encounter (Signed)
New Message::        Please call Ann from Dr Andrew Au office.   She needs to talk to you about pt's medicine.

## 2019-08-25 ENCOUNTER — Encounter (HOSPITAL_COMMUNITY): Payer: PPO

## 2019-08-28 DIAGNOSIS — E890 Postprocedural hypothyroidism: Secondary | ICD-10-CM | POA: Diagnosis not present

## 2019-08-28 DIAGNOSIS — I129 Hypertensive chronic kidney disease with stage 1 through stage 4 chronic kidney disease, or unspecified chronic kidney disease: Secondary | ICD-10-CM | POA: Diagnosis not present

## 2019-08-28 DIAGNOSIS — N183 Chronic kidney disease, stage 3 (moderate): Secondary | ICD-10-CM | POA: Diagnosis not present

## 2019-08-28 DIAGNOSIS — E1129 Type 2 diabetes mellitus with other diabetic kidney complication: Secondary | ICD-10-CM | POA: Diagnosis not present

## 2019-08-28 DIAGNOSIS — I5033 Acute on chronic diastolic (congestive) heart failure: Secondary | ICD-10-CM | POA: Diagnosis not present

## 2019-08-28 DIAGNOSIS — I5030 Unspecified diastolic (congestive) heart failure: Secondary | ICD-10-CM | POA: Diagnosis not present

## 2019-08-28 DIAGNOSIS — N400 Enlarged prostate without lower urinary tract symptoms: Secondary | ICD-10-CM | POA: Diagnosis not present

## 2019-08-31 ENCOUNTER — Other Ambulatory Visit: Payer: Self-pay

## 2019-08-31 ENCOUNTER — Ambulatory Visit (HOSPITAL_COMMUNITY)
Admission: RE | Admit: 2019-08-31 | Discharge: 2019-08-31 | Disposition: A | Discharge status: Home / Self Care | Payer: PPO | Source: Ambulatory Visit | Attending: Internal Medicine | Admitting: Internal Medicine

## 2019-08-31 ENCOUNTER — Encounter (HOSPITAL_COMMUNITY): Payer: Self-pay

## 2019-08-31 VITALS — BP 147/60 | HR 77 | Wt 293.2 lb

## 2019-08-31 DIAGNOSIS — Z888 Allergy status to other drugs, medicaments and biological substances status: Secondary | ICD-10-CM | POA: Diagnosis not present

## 2019-08-31 DIAGNOSIS — G2581 Restless legs syndrome: Secondary | ICD-10-CM | POA: Insufficient documentation

## 2019-08-31 DIAGNOSIS — G4733 Obstructive sleep apnea (adult) (pediatric): Secondary | ICD-10-CM | POA: Diagnosis not present

## 2019-08-31 DIAGNOSIS — E669 Obesity, unspecified: Secondary | ICD-10-CM | POA: Insufficient documentation

## 2019-08-31 DIAGNOSIS — E1165 Type 2 diabetes mellitus with hyperglycemia: Secondary | ICD-10-CM | POA: Insufficient documentation

## 2019-08-31 DIAGNOSIS — Z833 Family history of diabetes mellitus: Secondary | ICD-10-CM | POA: Insufficient documentation

## 2019-08-31 DIAGNOSIS — I10 Essential (primary) hypertension: Secondary | ICD-10-CM | POA: Diagnosis not present

## 2019-08-31 DIAGNOSIS — I5033 Acute on chronic diastolic (congestive) heart failure: Secondary | ICD-10-CM

## 2019-08-31 DIAGNOSIS — I2510 Atherosclerotic heart disease of native coronary artery without angina pectoris: Secondary | ICD-10-CM | POA: Diagnosis not present

## 2019-08-31 DIAGNOSIS — N390 Urinary tract infection, site not specified: Secondary | ICD-10-CM | POA: Diagnosis not present

## 2019-08-31 DIAGNOSIS — Z87891 Personal history of nicotine dependence: Secondary | ICD-10-CM | POA: Insufficient documentation

## 2019-08-31 DIAGNOSIS — I5032 Chronic diastolic (congestive) heart failure: Secondary | ICD-10-CM | POA: Diagnosis not present

## 2019-08-31 DIAGNOSIS — M1990 Unspecified osteoarthritis, unspecified site: Secondary | ICD-10-CM | POA: Insufficient documentation

## 2019-08-31 DIAGNOSIS — Z881 Allergy status to other antibiotic agents status: Secondary | ICD-10-CM | POA: Insufficient documentation

## 2019-08-31 DIAGNOSIS — M4326 Fusion of spine, lumbar region: Secondary | ICD-10-CM | POA: Insufficient documentation

## 2019-08-31 DIAGNOSIS — N183 Chronic kidney disease, stage 3 (moderate): Secondary | ICD-10-CM | POA: Diagnosis not present

## 2019-08-31 DIAGNOSIS — Z9981 Dependence on supplemental oxygen: Secondary | ICD-10-CM | POA: Insufficient documentation

## 2019-08-31 DIAGNOSIS — E1122 Type 2 diabetes mellitus with diabetic chronic kidney disease: Secondary | ICD-10-CM | POA: Insufficient documentation

## 2019-08-31 DIAGNOSIS — Z7982 Long term (current) use of aspirin: Secondary | ICD-10-CM | POA: Insufficient documentation

## 2019-08-31 DIAGNOSIS — J9611 Chronic respiratory failure with hypoxia: Secondary | ICD-10-CM | POA: Insufficient documentation

## 2019-08-31 DIAGNOSIS — Z794 Long term (current) use of insulin: Secondary | ICD-10-CM | POA: Diagnosis not present

## 2019-08-31 DIAGNOSIS — I130 Hypertensive heart and chronic kidney disease with heart failure and stage 1 through stage 4 chronic kidney disease, or unspecified chronic kidney disease: Secondary | ICD-10-CM | POA: Insufficient documentation

## 2019-08-31 DIAGNOSIS — M4807 Spinal stenosis, lumbosacral region: Secondary | ICD-10-CM | POA: Insufficient documentation

## 2019-08-31 DIAGNOSIS — Z884 Allergy status to anesthetic agent status: Secondary | ICD-10-CM | POA: Insufficient documentation

## 2019-08-31 DIAGNOSIS — Z882 Allergy status to sulfonamides status: Secondary | ICD-10-CM | POA: Insufficient documentation

## 2019-08-31 DIAGNOSIS — Z79899 Other long term (current) drug therapy: Secondary | ICD-10-CM | POA: Insufficient documentation

## 2019-08-31 DIAGNOSIS — R21 Rash and other nonspecific skin eruption: Secondary | ICD-10-CM | POA: Diagnosis not present

## 2019-08-31 DIAGNOSIS — Z8249 Family history of ischemic heart disease and other diseases of the circulatory system: Secondary | ICD-10-CM | POA: Diagnosis not present

## 2019-08-31 DIAGNOSIS — J449 Chronic obstructive pulmonary disease, unspecified: Secondary | ICD-10-CM | POA: Insufficient documentation

## 2019-08-31 DIAGNOSIS — E039 Hypothyroidism, unspecified: Secondary | ICD-10-CM | POA: Diagnosis not present

## 2019-08-31 DIAGNOSIS — E1142 Type 2 diabetes mellitus with diabetic polyneuropathy: Secondary | ICD-10-CM | POA: Insufficient documentation

## 2019-08-31 DIAGNOSIS — Z9049 Acquired absence of other specified parts of digestive tract: Secondary | ICD-10-CM | POA: Insufficient documentation

## 2019-08-31 DIAGNOSIS — I4510 Unspecified right bundle-branch block: Secondary | ICD-10-CM | POA: Insufficient documentation

## 2019-08-31 LAB — BASIC METABOLIC PANEL
Anion gap: 14 (ref 5–15)
BUN: 70 mg/dL — ABNORMAL HIGH (ref 8–23)
CO2: 34 mmol/L — ABNORMAL HIGH (ref 22–32)
Calcium: 9.4 mg/dL (ref 8.9–10.3)
Chloride: 90 mmol/L — ABNORMAL LOW (ref 98–111)
Creatinine, Ser: 1.91 mg/dL — ABNORMAL HIGH (ref 0.61–1.24)
GFR calc Af Amer: 39 mL/min — ABNORMAL LOW (ref >60)
GFR calc non Af Amer: 34 mL/min — ABNORMAL LOW (ref >60)
Glucose, Bld: 190 mg/dL — ABNORMAL HIGH (ref 70–99)
Potassium: 3.9 mmol/L (ref 3.5–5.1)
Sodium: 138 mmol/L (ref 135–145)

## 2019-08-31 MED ORDER — METOLAZONE 2.5 MG PO TABS: 2.5000 mg | ORAL_TABLET | Freq: Every day | ORAL | 3 refills | Status: AC

## 2019-08-31 NOTE — Patient Instructions (Signed)
Take metolazone 2.5 mg daily.  Follow up in 1 week.  Routine lab work today. Will notify you of abnormal results

## 2019-08-31 NOTE — Progress Notes (Signed)
[facility]   08/31/2019 Travis [name]   14-May-1946  [id number]  Primary Physician Travis Arabian, MD Primary Cardiologist: Travis Moores, MD  Electrophysiologist: None  Heart Failure: Dr. Aundra [name]   Reason for Visit/CC: f/u for chronic diastolic CHF  HPI:  Travis [name] is a 73 y.o. male who is being seen today for f/u for chronic diastolic CHF. His daughter-in law is a Therapist, sports at [employer]. Works on [employer].  History of diastolic heart failure, HTN, hypothyroidism, obesity, OSA, DMII, 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  Admitted 5/2-5/13/20 with volume overload. Diuresed with lasix drip and metolazone. Had AKI that improved with switching back to oral diuretics. Also treated for cellulitis with doxy. PT recommended SNF, but he refused. DC weight 281 lbs. He was discharged on torsemide 80 mg daily with 2.5 mg metolazone weekly. Discharged with [facility].   Admitted 08/21/22 with A/C diastolic HF. Diuresed with lasix drip and later transitioned to torsemide 80 mg twice a day + metolazone twice a week.Discharge weight was 285 pounds.  Re-admitted 07/21/19 with AMS. He was treated for UTI and sent home the next day.  Seen 8/5 in clinic and was volume overloaded and given a dose of IV lasix w/ good response. Home diuretics also increased that visit, torsemide 100/100 + metolazone 2x per week. He had f/u again 8/13 and was feeling better. Still w/ SOB w/ exertion but had improved. Denied orthopnea/ PND. Will w/ edema and metolazone increased to MWF. He had labs at visit. SCr was 2.03 (baseline ~1.8). his hydralazine was also increased to 37.5 mg TID for HTN.  He is back today for f/u. With his daughter-in-law. She has been monitoring him closely. She notes significant improvement w/ less LEE but still present. Weight is slowly trending downward. No weight gain. Compliant w/ home diuretics. Good UOP. His biggest hurtle is his poor diet. Does not salt  restrict. Eats what he wants but trying to improve. He reports he feels fine but sleepy often. Dosing off during examination. Daughter in law doing most of the talking. Denies CP. On chronic home O2. No recent increased O2 requirements. Tolerating higher dose of hydralazine ok. BP remains mildly elevated at 147/60 but in the [id number] systolic per report.     Current Meds  Medication Sig  . aspirin EC 81 MG tablet Take 81 mg by mouth daily.  [name] atorvastatin (LIPITOR) 20 MG tablet Take 20 mg by mouth every evening.   . Blood Glucose Monitoring Suppl (ONETOUCH VERIO) w/Device KIT 1 kit by Does not apply route 4 (four) times daily. Use as directed to test blood sugars 4 times daily DX E11.42  . cefdinir (OMNICEF) 300 MG capsule Take 300 mg by mouth 2 (two) times daily.  . Continuous Blood Gluc Receiver (FREESTYLE LIBRE 14 DAY READER) DEVI 1 kit by Does not apply route 3 (three) times daily.  . Continuous Blood Gluc Sensor (FREESTYLE LIBRE 14 DAY SENSOR) MISC 1 kit by Does not apply route 3 (three) times daily.  [name] escitalopram (LEXAPRO) 20 MG tablet Take 20 mg by mouth daily.  [name] gabapentin (NEURONTIN) 400 MG capsule Take 400 mg by mouth 3 (three) times daily.  [name] glucose blood test strip Use as instructed to test blood sugars 4 times daily DX E11.42  . hydrALAZINE (APRESOLINE) 25 MG tablet Take 1.5 tablets (37.5 mg total) by mouth 3 (three) times daily.  . insulin aspart (NOVOLOG FLEXPEN) 100 UNIT/ML FlexPen Inject  18 Units into the skin 3 (three) times daily with meals.  . Insulin Glargine (LANTUS SOLOSTAR) 100 UNIT/ML Solostar Pen Inject 60 Units into the skin at bedtime.  . Insulin Pen Needle (BD PEN NEEDLE MICRO U/F) 32G X 6 MM MISC Four times daily  . Insulin Pen Needle (BD PEN NEEDLE MICRO U/F) 32G X 6 MM MISC 4 times a day  . Lancets (ONETOUCH ULTRASOFT) lancets Use as instructed to test blood sugars 4 times daily DX E11.42  . levothyroxine (SYNTHROID, LEVOTHROID) 175 MCG tablet Take 1 tablet  (175 mcg total) by mouth daily.  . metolazone (ZAROXOLYN) 2.5 MG tablet Take 1 tablet (2.5 mg total) by mouth daily.  . multivitamin (ONE-A-DAY MEN'S) TABS tablet Take 1 tablet by mouth at bedtime.  . OXYGEN Inhale 2-3 Units into the lungs at bedtime.  . polyethylene glycol (MIRALAX / GLYCOLAX) packet Take 17 g by mouth daily as needed. For constipation (Patient taking differently: Take 17 g by mouth at bedtime. )  . potassium chloride SA (K-DUR) 20 MEQ tablet Take 1 tablet (20 mEq total) by mouth 2 (two) times daily. Take 1 tab only on days you take Metolazone  . pramipexole (MIRAPEX) 0.5 MG tablet One tablet in the morning and 2 in the evening (Patient taking differently: Take 0.5 mg by mouth 2 (two) times daily. )  . spironolactone (ALDACTONE) 25 MG tablet Take 1 tablet (25 mg total) by mouth daily.  . tamsulosin (FLOMAX) 0.4 MG CAPS capsule Take 1 capsule (0.4 mg total) by mouth daily. (Patient taking differently: Take 0.4 mg by mouth at bedtime. )  . torsemide (DEMADEX) 100 MG tablet Take 1 tablet (100 mg total) by mouth 2 (two) times daily.  . TOVIAZ 8 MG TB24 tablet Take 1 tablet (8 mg total) by mouth daily.  . [DISCONTINUED] metolazone (ZAROXOLYN) 2.5 MG tablet Take 1 tablet (2.5 mg total) by mouth every Monday, Wednesday, and Friday.   Allergies  Allergen Reactions  . Amlodipine Swelling and Other (See Comments)    Causes edema (per [facility] paperwork)  . Plendil [Felodipine] Swelling and Other (See Comments)    Causes edema (per [facility] paperwork)  . Compazine [Prochlorperazine] Other (See Comments)    Caused agitation (per [facility] paperwork)  . Cymbalta [Duloxetine Hcl] Other (See Comments)    Made the patient "feel weird," per [facility] paperwork  . Jardiance [Empagliflozin] Other (See Comments)    Stopped by MD because of unwanted side effects  . Other Nausea Only and Other (See Comments)    ANESTHESIA UNSPECIFIED- makes sick, must have  anti-nausea medication in advance  . Levofloxacin In D5w Itching    Possible infusion reaction 03/29/17. Tolerating oral Levaquin 03/30/17  . Septra [Sulfamethoxazole-Trimethoprim] Nausea Only and Other (See Comments)    Per [facility] paperwork   Past Medical History:  Diagnosis Date  . Cervical myelopathy (HCC)    with myelomalacia at C5-6 and C3-4  . CHF (congestive heart failure) ([place])   . Constipation - functional    controlled with miralax  . COPD (chronic obstructive pulmonary disease) (HCC)    mild  . DJD (degenerative joint disease)    WHOLE SPINE  . Dyspnea   . Gait disorder   . Heart murmur    years ago   . Hypertension   . Hypothyroidism    Low d/t treatment of hyperthyroidism  . Lumbosacral spinal stenosis 03/13/2014  . On home oxygen therapy    "2.5L all the  time" (06/29/2018)  . Peripheral neuropathy   . Pneumonia   . PONV (postoperative nausea and vomiting)   . RBBB   . Renal disorder    acute kidney failure  . Restless legs syndrome 03/14/2015  . RLS (restless legs syndrome)   . Sleep apnea    DX  2009/2010 [facility]  . 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 Type II diabetes mellitus (HCC)    diet controlled   Family History  Problem Relation Age of Onset  . Polycythemia Mother   . Diabetes Father   . Hypertension Father   . Heart disease Father   . CAD Other        1 of his 4 brothers and father has CAD   Past Surgical History:  Procedure Laterality Date  . BACK SURGERY     6 surgeries  . CARDIAC CATHETERIZATION [name] 09/07/2015   Procedure: Right/Left Heart Cath and Coronary Angiography;  Surgeon: Peter M Martinique, MD;  Location: [place] [facility];  Service: Cardiovascular;  Laterality: [name];  . CHOLECYSTECTOMY    . EYE SURGERY     BIL CATARACTS  . LUMBAR LAMINECTOMY/DECOMPRESSION MICRODISCECTOMY  07/14/2012   Procedure: LUMBAR LAMINECTOMY/DECOMPRESSION MICRODISCECTOMY 2 LEVELS;  Surgeon: Eustace Moore, MD;  Location: [facility];   Service: Neurosurgery;  Laterality: Left;  Lumbar two three laminectomy, left thoracic four five transpedicular diskectomy  . POSTERIOR CERVICAL FUSION/FORAMINOTOMY  04/08/2018   Procedure: Laminectomy and Foraminotomy - Lumbar Four-Lumbar Five, instrumented posterolateral fusion Lumbar Four- Five, Laminectomy and Foraminotomy - Thoracic One-Thoracic Two;  Surgeon: Eustace Moore, MD;  Location[facility];  Service: Neurosurgery;;  posterior  . POSTERIOR LUMBAR FUSION  04/08/2018   Laminectomy and Foraminotomy - Lumbar Four-Lumbar Five, instrumented posterolateral fusion Lumbar Four- Five, Laminectomy and Foraminotomy - Thoracic One-Thoracic Two  . POSTERIOR LUMBAR FUSION  04/08/2018   Laminectomy and Foraminotomy - Lumbar Four-Lumbar Five, instrumented posterolateral fusion Lumbar Four- Five, Laminectomy and Foraminotomy - Thoracic One-Thoracic Two  . RIGHT HEART CATH [name] 07/18/2019   Procedure: RIGHT HEART CATH;  Surgeon: Jolaine Artist, MD;  Location: [place] [facility];  Service: Cardiovascular;  Laterality: [name];   Social History   Socioeconomic History  . Marital status: Married    Spouse name: carolyn  . Number of children: 3  . Years of education: 38  . Highest education level: Not on file  Occupational History    Employer: [employer]: Works in [place]  . Financial resource strain: Not on file  . Food insecurity    Worry: Not on file    Inability: Not on file  . Transportation needs    Medical: Not on file    Non-medical: Not on file  Tobacco Use  . Smoking status: Former Smoker    Packs/day: 2.00    Years: 20.00    Pack years: 40.00    Types: Cigarettes    Quit date: 06/28/1985    Years since quitting: 34.1  . Smokeless tobacco: Former Systems developer    Quit date: 06/28/1985  Substance and Sexual Activity  . Alcohol use: No  . Drug use: No  . Sexual activity: Not on file  Lifestyle  . Physical activity    Days per week: Not on  file    Minutes per session: Not on file  . Stress: Not on file  Relationships  . Social connections    Talks  on phone: Not on file    Gets together: Not on file    Attends religious service: Not on file    Active member of club or organization: Not on file    Attends meetings of clubs or organizations: Not on file    Relationship status: Not on file  . Intimate partner violence    Fear of current or ex partner: Not on file    Emotionally abused: Not on file    Physically abused: Not on file    Forced sexual activity: Not on file  Other Topics Concern  . Not on file  Social History Narrative   Lives with wife,  [name]   Patient is married with 3 children.   Patient has 16 yrs of education.   Patient is right handed.   Patient drinks 2 cups of caffeine per day.     Lipid Panel     Component Value Date/Time   CHOL 124 08/20/2015 1458   TRIG 82 08/20/2015 1458   HDL 29 (L) 08/20/2015 1458   CHOLHDL 4.3 08/20/2015 1458   VLDL 16 08/20/2015 1458   LDLCALC 79 08/20/2015 1458    Review of Systems: General: negative for chills, fever, night sweats or weight changes.  Cardiovascular: negative for chest pain, dyspnea on exertion, edema, orthopnea, palpitations, paroxysmal nocturnal dyspnea or shortness of breath Dermatological: negative for rash Respiratory: negative for cough or wheezing Urologic: negative for hematuria Abdominal: negative for nausea, vomiting, diarrhea, bright red blood per rectum, melena, or hematemesis Neurologic: negative for visual changes, syncope, or dizziness All other systems reviewed and are otherwise negative except as noted above.   Physical Exam:  Blood pressure (!) 147/60, pulse 77, weight 133 kg (293 lb 3.2 oz), SpO2 (!) 87 %.  General appearance: alert, cooperative, no distress, morbidly obese and elderly WM Neck: no carotid bruit and no JVD Lungs: decreased BS at the bases bilaterally, slight expiratory wheezing on the left, no crackles  Heart: regular rate and rhythm, S1, S2 normal, no murmur, click, rub or gallop Abdomen: obese, disteneded abdomen, edematous Extremities: 2+ bilateral LEE Pulses: 2+ and symmetric Skin: Skin color, texture, turgor normal. No rashes or lesions Neurologic: Grossly normal  EKG not performed -- personally reviewed   ASSESSMENT AND PLAN:   1.Chronic Diastolic Heart Failure with R>>L symptoms - ECHO 06/29/2018 EF 55-60% Grade IDD  - Suspect due to R-sided HF from OSA, OHS and COPD - PYP scan 10/2018 with Grade 1, H/CLL 1.45. Reviewed by Dr. Mauricia Area determined to be negative.Myeloma panel with no M-spike, normal IFE. - Echo 5/4 EF 60-65%, normal RV NYHA IIIb.  Remains volume overloaded but slowly improving. 2+ bilateral LEE on exam. Respiratory status stable. - I discussed pt case with Dr. [name] today. We will obtain f/u BMP today and will try to increase metolazone to 2.5 mg daily. Will need weekly BMPs for close monitoring of renal fx and K x 3 weeks.  - Return for provider f/u in 1 week to reassess response - Continue torsemide to 100 mg twice a day -Continue spiro 25 daily - Intolerant to [place] with UTI and rash. - Discussed importance of salt restriction and encouraged compression stockings and leg elevation to help w/ edema and improve diuresis   2.Chronic Hypoxic Respiratory Failure/COPD - Continue BiPAP qHS with 3 L O2. -Continue nightly.   3. CKD Stage III Creatinine baseline ~1.8  -Check BMET today.  - will need weekly monitoring x 3 weeks given daily metolazone  4. OSA - Continue Bipap nightly.   5. CAD - Non-obstructive. LHC 08/2015: LM 40, LAD distal 40, LCx ostial 50, RCA proximal 40 -No chest pain  - On ASA/statin -Offb-blocker   6. DM - Uncontrolled. A1C is 10.0 -Intolerant to [place] with UTI and rash. - Per PCP.  7.Deconditioning - Followed by [place]  8. HTN -Remains mild-moderately elevated but suspect largely due to  volume overload. -increasing diuretics as noted above -reassess BP at next f/u in 7 days  Follow-Up w/ APP in 1 week   Travis [name], MHS [facility] 08/31/2019 4:31 PM

## 2019-09-03 DIAGNOSIS — Z87891 Personal history of nicotine dependence: Secondary | ICD-10-CM | POA: Diagnosis not present

## 2019-09-03 DIAGNOSIS — N400 Enlarged prostate without lower urinary tract symptoms: Secondary | ICD-10-CM | POA: Diagnosis not present

## 2019-09-03 DIAGNOSIS — G2581 Restless legs syndrome: Secondary | ICD-10-CM | POA: Diagnosis not present

## 2019-09-03 DIAGNOSIS — E890 Postprocedural hypothyroidism: Secondary | ICD-10-CM | POA: Diagnosis not present

## 2019-09-03 DIAGNOSIS — N183 Chronic kidney disease, stage 3 (moderate): Secondary | ICD-10-CM | POA: Diagnosis not present

## 2019-09-03 DIAGNOSIS — F329 Major depressive disorder, single episode, unspecified: Secondary | ICD-10-CM | POA: Diagnosis not present

## 2019-09-03 DIAGNOSIS — I130 Hypertensive heart and chronic kidney disease with heart failure and stage 1 through stage 4 chronic kidney disease, or unspecified chronic kidney disease: Secondary | ICD-10-CM | POA: Diagnosis not present

## 2019-09-03 DIAGNOSIS — G4730 Sleep apnea, unspecified: Secondary | ICD-10-CM | POA: Diagnosis not present

## 2019-09-03 DIAGNOSIS — F411 Generalized anxiety disorder: Secondary | ICD-10-CM | POA: Diagnosis not present

## 2019-09-03 DIAGNOSIS — I5032 Chronic diastolic (congestive) heart failure: Secondary | ICD-10-CM | POA: Diagnosis not present

## 2019-09-03 DIAGNOSIS — I700 Atherosclerosis of aorta: Secondary | ICD-10-CM | POA: Diagnosis not present

## 2019-09-03 DIAGNOSIS — Z981 Arthrodesis status: Secondary | ICD-10-CM | POA: Diagnosis not present

## 2019-09-03 DIAGNOSIS — G801 Spastic diplegic cerebral palsy: Secondary | ICD-10-CM | POA: Diagnosis not present

## 2019-09-03 DIAGNOSIS — Z79899 Other long term (current) drug therapy: Secondary | ICD-10-CM | POA: Diagnosis not present

## 2019-09-03 DIAGNOSIS — Z7982 Long term (current) use of aspirin: Secondary | ICD-10-CM | POA: Diagnosis not present

## 2019-09-03 DIAGNOSIS — G629 Polyneuropathy, unspecified: Secondary | ICD-10-CM | POA: Diagnosis not present

## 2019-09-03 DIAGNOSIS — F39 Unspecified mood [affective] disorder: Secondary | ICD-10-CM | POA: Diagnosis not present

## 2019-09-03 DIAGNOSIS — E1122 Type 2 diabetes mellitus with diabetic chronic kidney disease: Secondary | ICD-10-CM | POA: Diagnosis not present

## 2019-09-03 DIAGNOSIS — K219 Gastro-esophageal reflux disease without esophagitis: Secondary | ICD-10-CM | POA: Diagnosis not present

## 2019-09-03 DIAGNOSIS — Z794 Long term (current) use of insulin: Secondary | ICD-10-CM | POA: Diagnosis not present

## 2019-09-03 DIAGNOSIS — Z9049 Acquired absence of other specified parts of digestive tract: Secondary | ICD-10-CM | POA: Diagnosis not present

## 2019-09-03 DIAGNOSIS — M6281 Muscle weakness (generalized): Secondary | ICD-10-CM | POA: Diagnosis not present

## 2019-09-07 ENCOUNTER — Encounter (HOSPITAL_COMMUNITY): Payer: PPO

## 2019-09-07 DIAGNOSIS — R300 Dysuria: Secondary | ICD-10-CM | POA: Diagnosis not present

## 2019-09-08 ENCOUNTER — Ambulatory Visit (HOSPITAL_COMMUNITY)
Admission: RE | Admit: 2019-09-08 | Discharge: 2019-09-08 | Disposition: A | Discharge status: Home / Self Care | Payer: PPO | Source: Ambulatory Visit | Attending: Internal Medicine | Admitting: Internal Medicine

## 2019-09-08 ENCOUNTER — Other Ambulatory Visit: Payer: Self-pay

## 2019-09-08 ENCOUNTER — Encounter (HOSPITAL_COMMUNITY): Payer: Self-pay

## 2019-09-08 VITALS — BP 143/70 | HR 73 | Wt 285.0 lb

## 2019-09-08 DIAGNOSIS — G2581 Restless legs syndrome: Secondary | ICD-10-CM | POA: Insufficient documentation

## 2019-09-08 DIAGNOSIS — G4733 Obstructive sleep apnea (adult) (pediatric): Secondary | ICD-10-CM

## 2019-09-08 DIAGNOSIS — R0602 Shortness of breath: Secondary | ICD-10-CM | POA: Insufficient documentation

## 2019-09-08 DIAGNOSIS — E1122 Type 2 diabetes mellitus with diabetic chronic kidney disease: Secondary | ICD-10-CM | POA: Insufficient documentation

## 2019-09-08 DIAGNOSIS — M1990 Unspecified osteoarthritis, unspecified site: Secondary | ICD-10-CM | POA: Diagnosis not present

## 2019-09-08 DIAGNOSIS — N183 Chronic kidney disease, stage 3 unspecified: Secondary | ICD-10-CM

## 2019-09-08 DIAGNOSIS — I5032 Chronic diastolic (congestive) heart failure: Secondary | ICD-10-CM | POA: Diagnosis not present

## 2019-09-08 DIAGNOSIS — F17210 Nicotine dependence, cigarettes, uncomplicated: Secondary | ICD-10-CM | POA: Diagnosis not present

## 2019-09-08 DIAGNOSIS — Z79899 Other long term (current) drug therapy: Secondary | ICD-10-CM | POA: Insufficient documentation

## 2019-09-08 DIAGNOSIS — Z794 Long term (current) use of insulin: Secondary | ICD-10-CM | POA: Diagnosis not present

## 2019-09-08 DIAGNOSIS — Z9981 Dependence on supplemental oxygen: Secondary | ICD-10-CM | POA: Diagnosis not present

## 2019-09-08 DIAGNOSIS — I272 Pulmonary hypertension, unspecified: Secondary | ICD-10-CM | POA: Insufficient documentation

## 2019-09-08 DIAGNOSIS — E1165 Type 2 diabetes mellitus with hyperglycemia: Secondary | ICD-10-CM | POA: Diagnosis not present

## 2019-09-08 DIAGNOSIS — J449 Chronic obstructive pulmonary disease, unspecified: Secondary | ICD-10-CM | POA: Insufficient documentation

## 2019-09-08 DIAGNOSIS — I130 Hypertensive heart and chronic kidney disease with heart failure and stage 1 through stage 4 chronic kidney disease, or unspecified chronic kidney disease: Secondary | ICD-10-CM | POA: Insufficient documentation

## 2019-09-08 DIAGNOSIS — I5022 Chronic systolic (congestive) heart failure: Secondary | ICD-10-CM | POA: Diagnosis not present

## 2019-09-08 DIAGNOSIS — E039 Hypothyroidism, unspecified: Secondary | ICD-10-CM | POA: Diagnosis not present

## 2019-09-08 DIAGNOSIS — Z7982 Long term (current) use of aspirin: Secondary | ICD-10-CM | POA: Diagnosis not present

## 2019-09-08 DIAGNOSIS — Z8249 Family history of ischemic heart disease and other diseases of the circulatory system: Secondary | ICD-10-CM | POA: Insufficient documentation

## 2019-09-08 DIAGNOSIS — J9611 Chronic respiratory failure with hypoxia: Secondary | ICD-10-CM | POA: Insufficient documentation

## 2019-09-08 DIAGNOSIS — I2510 Atherosclerotic heart disease of native coronary artery without angina pectoris: Secondary | ICD-10-CM | POA: Diagnosis not present

## 2019-09-08 LAB — BASIC METABOLIC PANEL
Anion gap: 13 (ref 5–15)
BUN: 94 mg/dL — ABNORMAL HIGH (ref 8–23)
CO2: 33 mmol/L — ABNORMAL HIGH (ref 22–32)
Calcium: 9.4 mg/dL (ref 8.9–10.3)
Chloride: 89 mmol/L — ABNORMAL LOW (ref 98–111)
Creatinine, Ser: 2.22 mg/dL — ABNORMAL HIGH (ref 0.61–1.24)
GFR calc Af Amer: 33 mL/min — ABNORMAL LOW (ref >60)
GFR calc non Af Amer: 28 mL/min — ABNORMAL LOW (ref >60)
Glucose, Bld: 232 mg/dL — ABNORMAL HIGH (ref 70–99)
Potassium: 3.8 mmol/L (ref 3.5–5.1)
Sodium: 135 mmol/L (ref 135–145)

## 2019-09-08 NOTE — Progress Notes (Signed)
Heart Failure [facility]  ID:  Travis [name], Travis [name] 1946/01/28, MRN [id number]  Location: Home  Provider location: [address] [place] Type of Visit: Established patient   PCP:  [name], MD  Cardiologist:  Mertie Moores, MD Primary HF: Dr [name]  Chief Complaint: Heart Failure   History of Present Illness: Travis [name] is a 74 y.o. 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  Admitted 5/2-5/13/20 with volume overload. Diuresed with lasix drip and metolazone. Had AKI that improved with switching back to oral diuretics. Also treated for cellulitis with doxy. PT recommended SNF, but he refused. DC weight 281 lbs. He was discharged on torsemide 80 mg daily with 2.5 mg metolazone weekly. Discharged with [facility] HHRN/HHPT.   Admitted 4/91/79 with A/C diastolic HF. Diuresed with lasix drip and later transitioned to torsemide 80 mg twice a day + metolazone twice a week.Discharge weight was 285 pounds.  Re-admitted 07/21/19 with AMS. He was treated for UTI and sent home the next day.  Today he returns for HF follow up. Last week he seen in the HF clinic and metolazone was increased to daily. Overall feeling better but remains SOB with exertion. He is using oxygen intermittently and notices increased shortness of breath without it. Denies PND/Orthopnea. Appetite ok. No fever or chills. Weight at home trending down to 285  pounds. Using Bipap every night. Taking all medications. Requires assistance with ADLs. Lots of family support.   RHC 7/20//20  RA = 8 RV = 42/9 PA = 44/11 (26) Mild pulmonary hypertension.  PCW = 13 Fick cardiac output/index = 8.8/3.5 PVR = 1.5 WU Ao sat = 93% PA sat = 79%, 71%  RHC 06/30/2019 RA = 8 RV = 42/9 PA = 44/11 (26) PCW = 13 Fick cardiac output/index = 8.8/3.5 PVR = 1.5 WU Ao sat = 93% PA sat = 79%, 71% Assessment: 1. Mild PAH  2. Well compensated filling pressures and output Metolazone to twice a week (Monday and Friday). Dry weight now 285-287.     Past Medical History:  Diagnosis Date  . Cervical myelopathy (HCC)    with myelomalacia at C5-6 and C3-4  . CHF (congestive heart failure) ([place])   . Constipation - functional    controlled with miralax  . COPD (chronic obstructive pulmonary disease) (HCC)    mild  . DJD (degenerative joint disease)    WHOLE SPINE  . Dyspnea   . Gait disorder   . Heart murmur    years ago   . Hypertension   . Hypothyroidism    Low d/t treatment of hyperthyroidism  . Lumbosacral spinal stenosis 03/13/2014  . On home oxygen therapy    "2.5L all the time" (06/29/2018)  . Peripheral neuropathy   . Pneumonia   . PONV (postoperative nausea and vomiting)   . RBBB   . Renal disorder    acute kidney failure  . Restless legs syndrome 03/14/2015  . RLS (restless legs syndrome)   . Sleep apnea    DX  2009/2010 [facility]  . 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 Type II diabetes mellitus ([place])    diet controlled   Past Surgical History:  Procedure Laterality Date  . BACK SURGERY     6 surgeries  . CARDIAC CATHETERIZATION [name] 09/07/2015   Procedure: Right/Left Heart Cath and Coronary Angiography;  Surgeon:  Peter M Martinique, MD;  Location: [place] [facility];  Service: Cardiovascular;  Laterality: [name];  . CHOLECYSTECTOMY    . EYE SURGERY     BIL CATARACTS  . LUMBAR LAMINECTOMY/DECOMPRESSION MICRODISCECTOMY  07/14/2012   Procedure: LUMBAR LAMINECTOMY/DECOMPRESSION MICRODISCECTOMY 2 LEVELS;  Surgeon: Eustace Moore, MD;  Location: [place] [facility]UR[facility];  Service: Neurosurgery;  Laterality: Left;  Lumbar two three laminectomy, left thoracic four five transpedicular diskectomy  . POSTERIOR CERVICAL FUSION/FORAMINOTOMY  04/08/2018   Procedure: Laminectomy and Foraminotomy - Lumbar Four-Lumbar Five, instrumented posterolateral fusion Lumbar Four- Five, Laminectomy and Foraminotomy -  Thoracic One-Thoracic Two;  Surgeon: Eustace Moore, MD;  Location: [facility] OR;  Service: Neurosurgery;;  posterior  . POSTERIOR LUMBAR FUSION  04/08/2018   Laminectomy and Foraminotomy - Lumbar Four-Lumbar Five, instrumented posterolateral fusion Lumbar Four- Five, Laminectomy and Foraminotomy - Thoracic One-Thoracic Two  . POSTERIOR LUMBAR FUSION  04/08/2018   Laminectomy and Foraminotomy - Lumbar Four-Lumbar Five, instrumented posterolateral fusion Lumbar Four- Five, Laminectomy and Foraminotomy - Thoracic One-Thoracic Two  . RIGHT HEART CATH [name] 07/18/2019   Procedure: RIGHT HEART CATH;  Surgeon: Jolaine Artist, MD;  Location: [place] [facility];  Service: Cardiovascular;  Laterality: [name];     Current Outpatient Medications  Medication Sig Dispense Refill  . aspirin EC 81 MG tablet Take 81 mg by mouth daily.    [name] atorvastatin (LIPITOR) 20 MG tablet Take 20 mg by mouth every evening.     . Blood Glucose Monitoring Suppl (ONETOUCH VERIO) w/Device KIT 1 kit by Does not apply route 4 (four) times daily. Use as directed to test blood sugars 4 times daily DX E11.42 1 kit 0  . cefdinir (OMNICEF) 300 MG capsule Take 300 mg by mouth 2 (two) times daily.    . Continuous Blood Gluc Receiver (FREESTYLE LIBRE 14 DAY READER) DEVI 1 kit by Does not apply route 3 (three) times daily. 1 Device 0  . Continuous Blood Gluc Sensor (FREESTYLE LIBRE 14 DAY SENSOR) MISC 1 kit by Does not apply route 3 (three) times daily. 3 each 11  . escitalopram (LEXAPRO) 20 MG tablet Take 20 mg by mouth daily.    [name] gabapentin (NEURONTIN) 400 MG capsule Take 400 mg by mouth 3 (three) times daily.    [name] glucose blood test strip Use as instructed to test blood sugars 4 times daily DX E11.42 100 each 12  . hydrALAZINE (APRESOLINE) 25 MG tablet Take 1.5 tablets (37.5 mg total) by mouth 3 (three) times daily. 135 tablet 5  . insulin aspart (NOVOLOG FLEXPEN) 100 UNIT/ML FlexPen Inject 18 Units into the skin 3 (three) times daily with  meals. 15 mL 11  . Insulin Glargine (LANTUS SOLOSTAR) 100 UNIT/ML Solostar Pen Inject 60 Units into the skin at bedtime. 15 mL 11  . Insulin Pen Needle (BD PEN NEEDLE MICRO U/F) 32G X 6 MM MISC Four times daily 150 each 6  . Insulin Pen Needle (BD PEN NEEDLE MICRO U/F) 32G X 6 MM MISC 4 times a day 150 each 11  . Lancets (ONETOUCH ULTRASOFT) lancets Use as instructed to test blood sugars 4 times daily DX E11.42 100 each 12  . levothyroxine (SYNTHROID, LEVOTHROID) 175 MCG tablet Take 1 tablet (175 mcg total) by mouth daily. 30 tablet 2  . metolazone (ZAROXOLYN) 2.5 MG tablet Take 1 tablet (2.5 mg total) by mouth daily. 30 tablet 3  . multivitamin (ONE-A-DAY MEN'S) TABS tablet Take 1 tablet by mouth at bedtime.    [name]  OXYGEN Inhale 2-3 Units into the lungs at bedtime.    . polyethylene glycol (MIRALAX / GLYCOLAX) packet Take 17 g by mouth daily as needed. For constipation (Patient taking differently: Take 17 g by mouth at bedtime. ) 14 each 0  . potassium chloride SA (K-DUR) 20 MEQ tablet Take 1 tablet (20 mEq total) by mouth 2 (two) times daily. Take 1 tab only on days you take Metolazone 60 tablet 0  . pramipexole (MIRAPEX) 0.5 MG tablet One tablet in the morning and 2 in the evening (Patient taking differently: Take 0.5 mg by mouth 2 (two) times daily. ) 270 tablet 1  . spironolactone (ALDACTONE) 25 MG tablet Take 1 tablet (25 mg total) by mouth daily. 90 tablet 3  . tamsulosin (FLOMAX) 0.4 MG CAPS capsule Take 1 capsule (0.4 mg total) by mouth daily. (Patient taking differently: Take 0.4 mg by mouth at bedtime. ) 30 capsule 0  . torsemide (DEMADEX) 100 MG tablet Take 1 tablet (100 mg total) by mouth 2 (two) times daily. 180 tablet 3  . TOVIAZ 8 MG TB24 tablet Take 1 tablet (8 mg total) by mouth daily. 30 tablet 11   No current facility-administered medications for this encounter.     Allergies:   Amlodipine, Plendil [felodipine], Compazine [prochlorperazine], Cymbalta [duloxetine hcl], Jardiance  [empagliflozin], Other, Levofloxacin in d5w, and Septra [sulfamethoxazole-trimethoprim]   Social History:  The patient  reports that he quit smoking about 34 years ago. His smoking use included cigarettes. He has a 40.00 pack-year smoking history. He quit smokeless tobacco use about 34 years ago. He reports that he does not drink alcohol or use drugs.   Family History:  The patient's family history includes CAD in an other family member; Diabetes in his father; Heart disease in his father; Hypertension in his father; Polycythemia in his mother.   ROS:  Please see the history of present illness.   All other systems are personally reviewed and negative.    Wt Readings from Last 3 Encounters:  09/08/19 129.3 kg (285 lb)  08/31/19 133 kg (293 lb 3.2 oz)  08/11/19 134 kg (295 lb 8 oz)   Today's Vitals   09/08/19 1207  BP: (!) 143/70  Pulse: 73  SpO2: 94%  Weight: 129.3 kg (285 lb)   Body mass index is 37.6 kg/m.  Exam:   General:  Appears chronically ill. No resp difficulty. In a wheel chair. HEENT: normal Neck: supple. JVP 7-8 . Carotids 2+ bilat; no bruits. No lymphadenopathy or thryomegaly appreciated. Cor: PMI nondisplaced. Regular rate & rhythm. No rubs, gallops or murmurs. Lungs: Clear on 3 liters oxygen.  Abdomen: soft, nontender, nondistended. No hepatosplenomegaly. No bruits or masses. Good bowel sounds. Extremities: no cyanosis, clubbing, rash, R and LLE trace-1+ edema Neuro: alert & orientedx3, cranial nerves grossly intact. moves all 4 extremities w/o difficulty. Affect pleasant  Recent Labs: 07/14/2019: ALT 14; TSH 1.243 07/21/2019: Hemoglobin 13.0; Magnesium 3.1; Platelets 221 08/03/2019: B Natriuretic Peptide 70.1 08/31/2019: BUN 70; Creatinine, Ser 1.91; Potassium 3.9; Sodium 138  Personally reviewed  Wt Readings from Last 3 Encounters:  09/08/19 129.3 kg (285 lb)  08/31/19 133 kg (293 lb 3.2 oz)  08/11/19 134 kg (295 lb 8 oz)    ASSESSMENT AND PLAN:  1.Chronic  Diastolic Heart Failure with R>>L symptoms - ECHO 06/29/2018 EF 55-60% Grade IDD  - Suspect due to R-sided HF from OSA, OHS and COPD - PYP scan 10/2018 with Grade 1, H/CLL 1.45. Reviewed by Dr. Mauricia Area  determined to be negative.Myeloma panel with no M-spike, normal IFE. - Echo 5/4 EF 60-65%, normal RV Chronically NYHA III. Volume status ok today. Continue  torsemide to 100 mg twice a day and metolazone daily.  Check BMET today.  -Continue spiro 25 daily - Intolerant to Jardiance with UTI and rash.  2.Chronic Hypoxic Respiratory Failure/COPD - Using oxygen intermittently at home. I have asked him to wear oxygen continuously.  Continue BiPAP qHS with 3 L O2.  -Continue nightly.   3. CKD Stage III Creatinine baseline ~1.8  Check BMET today.   4. OSA - Continue Bipap nightly.   5. CAD - Non-obstructive. LHC 08/2015: LM 40, LAD distal 40, LCx ostial 50, RCA proximal 40 -No chest pain.  - On ASA/statin - Off b-blocker   6. DM - Uncontrolled. A1C is 10.0 -Intolerant to [place] with UTI and rash. - Per PCP.  7.Deconditioning - Followed by [place]  8. HTN Stable.   Follow up in 3 weeks.     Darnell Level, NP  09/08/2019 12:10 PM   [facility] [address] Heart and [place] [address] [phone] (office) [phone] (fax)

## 2019-09-08 NOTE — Patient Instructions (Signed)
It was great to see you today! No medication changes are needed at this time.  Your physician recommends that you schedule a follow-up appointment in: 2-3 weeks with Amy Clegg,NP  Do the following things EVERYDAY: 1) Weigh yourself in the morning before breakfast. Write it down and keep it in a log. 2) Take your medicines as prescribed 3) Eat low salt foods-Limit salt (sodium) to 2000 mg per day.  4) Stay as active as you can everyday 5) Limit all fluids for the day to less than 2 liters   At the [facility], you and your health needs are our priority. As part of our continuing mission to provide you with exceptional heart care, we have created designated Provider Care Teams. These Care Teams include your primary Cardiologist (physician) and Advanced Practice Providers (APPs- Physician Assistants and Nurse Practitioners) who all work together to provide you with the care you need, when you need it.   You may see any of the following providers on your designated Care Team at your next follow up: . Dr Daniel Bensimhon . Dr Dalton McLean . Amy Clegg, NP   Please be sure to bring in all your medications bottles to every appointment.     

## 2019-09-09 ENCOUNTER — Ambulatory Visit: Payer: PPO | Admitting: Cardiovascular Disease

## 2019-09-13 DIAGNOSIS — I10 Essential (primary) hypertension: Secondary | ICD-10-CM | POA: Diagnosis not present

## 2019-09-13 DIAGNOSIS — Z6837 Body mass index (BMI) 37.0-37.9, adult: Secondary | ICD-10-CM | POA: Diagnosis not present

## 2019-09-13 DIAGNOSIS — L89152 Pressure ulcer of sacral region, stage 2: Secondary | ICD-10-CM | POA: Diagnosis not present

## 2019-09-14 DIAGNOSIS — I5033 Acute on chronic diastolic (congestive) heart failure: Secondary | ICD-10-CM | POA: Diagnosis not present

## 2019-09-14 DIAGNOSIS — G9340 Encephalopathy, unspecified: Secondary | ICD-10-CM | POA: Diagnosis not present

## 2019-09-14 DIAGNOSIS — M4800 Spinal stenosis, site unspecified: Secondary | ICD-10-CM | POA: Diagnosis not present

## 2019-09-15 ENCOUNTER — Inpatient Hospital Stay (HOSPITAL_COMMUNITY)
Admission: EM | Admit: 2019-09-15 | Discharge: 2019-09-17 | DRG: 865 | Disposition: A | Discharge status: Home / Self Care | Payer: PPO | Attending: Internal Medicine | Admitting: Internal Medicine

## 2019-09-15 ENCOUNTER — Other Ambulatory Visit: Payer: Self-pay

## 2019-09-15 ENCOUNTER — Telehealth: Payer: Self-pay | Admitting: Physician Assistant

## 2019-09-15 ENCOUNTER — Emergency Department (HOSPITAL_COMMUNITY): Payer: PPO

## 2019-09-15 DIAGNOSIS — M5030 Other cervical disc degeneration, unspecified cervical region: Secondary | ICD-10-CM | POA: Diagnosis present

## 2019-09-15 DIAGNOSIS — Z888 Allergy status to other drugs, medicaments and biological substances status: Secondary | ICD-10-CM

## 2019-09-15 DIAGNOSIS — E039 Hypothyroidism, unspecified: Secondary | ICD-10-CM | POA: Diagnosis present

## 2019-09-15 DIAGNOSIS — N400 Enlarged prostate without lower urinary tract symptoms: Secondary | ICD-10-CM | POA: Diagnosis present

## 2019-09-15 DIAGNOSIS — R0602 Shortness of breath: Secondary | ICD-10-CM | POA: Diagnosis not present

## 2019-09-15 DIAGNOSIS — Z7984 Long term (current) use of oral hypoglycemic drugs: Secondary | ICD-10-CM

## 2019-09-15 DIAGNOSIS — N390 Urinary tract infection, site not specified: Secondary | ICD-10-CM | POA: Diagnosis present

## 2019-09-15 DIAGNOSIS — J9621 Acute and chronic respiratory failure with hypoxia: Secondary | ICD-10-CM | POA: Diagnosis present

## 2019-09-15 DIAGNOSIS — I25110 Atherosclerotic heart disease of native coronary artery with unstable angina pectoris: Secondary | ICD-10-CM | POA: Diagnosis present

## 2019-09-15 DIAGNOSIS — R0603 Acute respiratory distress: Secondary | ICD-10-CM | POA: Diagnosis not present

## 2019-09-15 DIAGNOSIS — F329 Major depressive disorder, single episode, unspecified: Secondary | ICD-10-CM | POA: Diagnosis present

## 2019-09-15 DIAGNOSIS — I272 Pulmonary hypertension, unspecified: Secondary | ICD-10-CM | POA: Diagnosis present

## 2019-09-15 DIAGNOSIS — I5032 Chronic diastolic (congestive) heart failure: Secondary | ICD-10-CM | POA: Diagnosis present

## 2019-09-15 DIAGNOSIS — I4510 Unspecified right bundle-branch block: Secondary | ICD-10-CM | POA: Diagnosis not present

## 2019-09-15 DIAGNOSIS — Z9842 Cataract extraction status, left eye: Secondary | ICD-10-CM

## 2019-09-15 DIAGNOSIS — N184 Chronic kidney disease, stage 4 (severe): Secondary | ICD-10-CM | POA: Diagnosis present

## 2019-09-15 DIAGNOSIS — R778 Other specified abnormalities of plasma proteins: Secondary | ICD-10-CM

## 2019-09-15 DIAGNOSIS — L89152 Pressure ulcer of sacral region, stage 2: Secondary | ICD-10-CM | POA: Diagnosis present

## 2019-09-15 DIAGNOSIS — N183 Chronic kidney disease, stage 3 unspecified: Secondary | ICD-10-CM | POA: Diagnosis present

## 2019-09-15 DIAGNOSIS — Z713 Dietary counseling and surveillance: Secondary | ICD-10-CM | POA: Diagnosis not present

## 2019-09-15 DIAGNOSIS — G2581 Restless legs syndrome: Secondary | ICD-10-CM | POA: Diagnosis present

## 2019-09-15 DIAGNOSIS — Z794 Long term (current) use of insulin: Secondary | ICD-10-CM | POA: Diagnosis not present

## 2019-09-15 DIAGNOSIS — R079 Chest pain, unspecified: Secondary | ICD-10-CM | POA: Diagnosis present

## 2019-09-15 DIAGNOSIS — B349 Viral infection, unspecified: Secondary | ICD-10-CM | POA: Diagnosis present

## 2019-09-15 DIAGNOSIS — T380X5A Adverse effect of glucocorticoids and synthetic analogues, initial encounter: Secondary | ICD-10-CM | POA: Diagnosis present

## 2019-09-15 DIAGNOSIS — R600 Localized edema: Secondary | ICD-10-CM

## 2019-09-15 DIAGNOSIS — Z6841 Body Mass Index (BMI) 40.0 and over, adult: Secondary | ICD-10-CM

## 2019-09-15 DIAGNOSIS — E1142 Type 2 diabetes mellitus with diabetic polyneuropathy: Secondary | ICD-10-CM | POA: Diagnosis present

## 2019-09-15 DIAGNOSIS — Z8249 Family history of ischemic heart disease and other diseases of the circulatory system: Secondary | ICD-10-CM

## 2019-09-15 DIAGNOSIS — E1165 Type 2 diabetes mellitus with hyperglycemia: Secondary | ICD-10-CM | POA: Diagnosis present

## 2019-09-15 DIAGNOSIS — R0902 Hypoxemia: Secondary | ICD-10-CM | POA: Diagnosis not present

## 2019-09-15 DIAGNOSIS — E662 Morbid (severe) obesity with alveolar hypoventilation: Secondary | ICD-10-CM | POA: Diagnosis present

## 2019-09-15 DIAGNOSIS — N179 Acute kidney failure, unspecified: Secondary | ICD-10-CM | POA: Diagnosis present

## 2019-09-15 DIAGNOSIS — J9622 Acute and chronic respiratory failure with hypercapnia: Secondary | ICD-10-CM | POA: Diagnosis present

## 2019-09-15 DIAGNOSIS — Z9049 Acquired absence of other specified parts of digestive tract: Secondary | ICD-10-CM

## 2019-09-15 DIAGNOSIS — Z981 Arthrodesis status: Secondary | ICD-10-CM

## 2019-09-15 DIAGNOSIS — J418 Mixed simple and mucopurulent chronic bronchitis: Secondary | ICD-10-CM | POA: Diagnosis not present

## 2019-09-15 DIAGNOSIS — G63 Polyneuropathy in diseases classified elsewhere: Secondary | ICD-10-CM | POA: Diagnosis present

## 2019-09-15 DIAGNOSIS — Z993 Dependence on wheelchair: Secondary | ICD-10-CM | POA: Diagnosis not present

## 2019-09-15 DIAGNOSIS — F419 Anxiety disorder, unspecified: Secondary | ICD-10-CM | POA: Diagnosis present

## 2019-09-15 DIAGNOSIS — E1122 Type 2 diabetes mellitus with diabetic chronic kidney disease: Secondary | ICD-10-CM | POA: Diagnosis present

## 2019-09-15 DIAGNOSIS — Z87440 Personal history of urinary (tract) infections: Secondary | ICD-10-CM | POA: Diagnosis not present

## 2019-09-15 DIAGNOSIS — Z9989 Dependence on other enabling machines and devices: Secondary | ICD-10-CM | POA: Diagnosis not present

## 2019-09-15 DIAGNOSIS — R269 Unspecified abnormalities of gait and mobility: Secondary | ICD-10-CM

## 2019-09-15 DIAGNOSIS — I130 Hypertensive heart and chronic kidney disease with heart failure and stage 1 through stage 4 chronic kidney disease, or unspecified chronic kidney disease: Secondary | ICD-10-CM | POA: Diagnosis present

## 2019-09-15 DIAGNOSIS — M4807 Spinal stenosis, lumbosacral region: Secondary | ICD-10-CM | POA: Diagnosis present

## 2019-09-15 DIAGNOSIS — G4733 Obstructive sleep apnea (adult) (pediatric): Secondary | ICD-10-CM

## 2019-09-15 DIAGNOSIS — R7989 Other specified abnormal findings of blood chemistry: Secondary | ICD-10-CM | POA: Diagnosis not present

## 2019-09-15 DIAGNOSIS — Z9981 Dependence on supplemental oxygen: Secondary | ICD-10-CM

## 2019-09-15 DIAGNOSIS — I2510 Atherosclerotic heart disease of native coronary artery without angina pectoris: Secondary | ICD-10-CM | POA: Diagnosis present

## 2019-09-15 DIAGNOSIS — Z79899 Other long term (current) drug therapy: Secondary | ICD-10-CM

## 2019-09-15 DIAGNOSIS — J438 Other emphysema: Secondary | ICD-10-CM | POA: Diagnosis not present

## 2019-09-15 DIAGNOSIS — R0689 Other abnormalities of breathing: Secondary | ICD-10-CM | POA: Diagnosis not present

## 2019-09-15 DIAGNOSIS — Z833 Family history of diabetes mellitus: Secondary | ICD-10-CM

## 2019-09-15 DIAGNOSIS — Z7982 Long term (current) use of aspirin: Secondary | ICD-10-CM

## 2019-09-15 DIAGNOSIS — X58XXXA Exposure to other specified factors, initial encounter: Secondary | ICD-10-CM | POA: Diagnosis present

## 2019-09-15 DIAGNOSIS — Z20828 Contact with and (suspected) exposure to other viral communicable diseases: Secondary | ICD-10-CM | POA: Diagnosis present

## 2019-09-15 DIAGNOSIS — Z882 Allergy status to sulfonamides status: Secondary | ICD-10-CM

## 2019-09-15 DIAGNOSIS — K5900 Constipation, unspecified: Secondary | ICD-10-CM | POA: Diagnosis present

## 2019-09-15 DIAGNOSIS — Z9841 Cataract extraction status, right eye: Secondary | ICD-10-CM

## 2019-09-15 DIAGNOSIS — J449 Chronic obstructive pulmonary disease, unspecified: Secondary | ICD-10-CM | POA: Diagnosis present

## 2019-09-15 LAB — POCT I-STAT EG7
Acid-Base Excess: 12 mmol/L — ABNORMAL HIGH (ref 0.0–2.0)
Bicarbonate: 38.4 mmol/L — ABNORMAL HIGH (ref 20.0–28.0)
Calcium, Ion: 1.04 mmol/L — ABNORMAL LOW (ref 1.15–1.40)
HCT: 41 % (ref 39.0–52.0)
Hemoglobin: 13.9 g/dL (ref 13.0–17.0)
O2 Saturation: 99 %
Potassium: 4.1 mmol/L (ref 3.5–5.1)
Sodium: 133 mmol/L — ABNORMAL LOW (ref 135–145)
TCO2: 40 mmol/L — ABNORMAL HIGH (ref 22–32)
pCO2, Ven: 56.7 mmHg (ref 44.0–60.0)
pH, Ven: 7.438 — ABNORMAL HIGH (ref 7.250–7.430)
pO2, Ven: 161 mmHg — ABNORMAL HIGH (ref 32.0–45.0)

## 2019-09-15 LAB — CBC
HCT: 42.4 % (ref 39.0–52.0)
Hemoglobin: 13.1 g/dL (ref 13.0–17.0)
MCH: 28.5 pg (ref 26.0–34.0)
MCHC: 30.9 g/dL (ref 30.0–36.0)
MCV: 92.2 fL (ref 80.0–100.0)
Platelets: 223 10*3/uL (ref 150–400)
RBC: 4.6 MIL/uL (ref 4.22–5.81)
RDW: 15.8 % — ABNORMAL HIGH (ref 11.5–15.5)
WBC: 10.6 10*3/uL — ABNORMAL HIGH (ref 4.0–10.5)
nRBC: 0 % (ref 0.0–0.2)

## 2019-09-15 LAB — BASIC METABOLIC PANEL
Anion gap: 15 (ref 5–15)
BUN: 96 mg/dL — ABNORMAL HIGH (ref 8–23)
CO2: 31 mmol/L (ref 22–32)
Calcium: 9 mg/dL (ref 8.9–10.3)
Chloride: 88 mmol/L — ABNORMAL LOW (ref 98–111)
Creatinine, Ser: 2.3 mg/dL — ABNORMAL HIGH (ref 0.61–1.24)
GFR calc Af Amer: 31 mL/min — ABNORMAL LOW (ref >60)
GFR calc non Af Amer: 27 mL/min — ABNORMAL LOW (ref >60)
Glucose, Bld: 337 mg/dL — ABNORMAL HIGH (ref 70–99)
Potassium: 4.4 mmol/L (ref 3.5–5.1)
Sodium: 134 mmol/L — ABNORMAL LOW (ref 135–145)

## 2019-09-15 LAB — SARS CORONAVIRUS 2 BY RT PCR (HOSPITAL ORDER, PERFORMED IN ~~LOC~~ HOSPITAL LAB): SARS Coronavirus 2: NEGATIVE

## 2019-09-15 LAB — BRAIN NATRIURETIC PEPTIDE: B Natriuretic Peptide: 28.6 pg/mL (ref 0.0–100.0)

## 2019-09-15 LAB — LACTIC ACID, PLASMA: Lactic Acid, Venous: 1.4 mmol/L (ref 0.5–1.9)

## 2019-09-15 LAB — TROPONIN I (HIGH SENSITIVITY): Troponin I (High Sensitivity): 18 ng/L — ABNORMAL HIGH (ref <18)

## 2019-09-15 MED ORDER — SODIUM CHLORIDE 0.9% FLUSH
3.0000 mL | Freq: Once | INTRAVENOUS | Status: AC
  Administered 2019-09-15: 3 mL via INTRAVENOUS

## 2019-09-15 MED ORDER — FUROSEMIDE 10 MG/ML IJ SOLN
160.0000 mg | Freq: Once | INTRAVENOUS | Status: AC
  Administered 2019-09-15: 160 mg via INTRAVENOUS
  Filled 2019-09-15: qty 10

## 2019-09-15 NOTE — ED Notes (Signed)
Pt. Placed on bipap.

## 2019-09-15 NOTE — ED Provider Notes (Signed)
[facility] Emergency Department Provider Note MRN:  [id number]  Arrival date & time: 09/15/19     Chief Complaint   Shortness of Breath   History of Present Illness   Travis [name] is a 73 y.o. year-old male with a history of CHF, COPD presenting to the ED with chief complaint of shortness of breath.  Slowly progressively worsening shortness of breath for the past 1 to 2 days, worse today.  Increased swelling to his legs.  Denies fever, no cough, no chest pain today but had 30 minutes of chest discomfort last night.  Denies abdominal pain.  No exacerbating or alleviating factors.  Review of Systems  A complete 10 system review of systems was obtained and all systems are negative except as noted in the HPI and PMH.   Patient's Health History    Past Medical History:  Diagnosis Date  . Cervical myelopathy (HCC)    with myelomalacia at C5-6 and C3-4  . CHF (congestive heart failure) ([place])   . Constipation - functional    controlled with miralax  . COPD (chronic obstructive pulmonary disease) (HCC)    mild  . DJD (degenerative joint disease)    WHOLE SPINE  . Dyspnea   . Gait disorder   . Heart murmur    years ago   . Hypertension   . Hypothyroidism    Low d/t treatment of hyperthyroidism  . Lumbosacral spinal stenosis 03/13/2014  . On home oxygen therapy    "2.5L all the time" (06/29/2018)  . Peripheral neuropathy   . Pneumonia   . PONV (postoperative nausea and vomiting)   . RBBB   . Renal disorder    acute kidney failure  . Restless legs syndrome 03/14/2015  . RLS (restless legs syndrome)   . Sleep apnea    DX  2009/2010 [facility] done at [facility]  . 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 Type II diabetes mellitus ([place])    diet controlled    Past Surgical History:  Procedure Laterality Date  . BACK SURGERY     6 surgeries  . CARDIAC CATHETERIZATION [name] 09/07/2015   Procedure: Right/Left Heart Cath and Coronary Angiography;  Surgeon: Peter M  Martinique, MD;  Location: [place] [facility];  Service: Cardiovascular;  Laterality: [name];  . CHOLECYSTECTOMY    . EYE SURGERY     BIL CATARACTS  . LUMBAR LAMINECTOMY/DECOMPRESSION MICRODISCECTOMY  07/14/2012   Procedure: LUMBAR LAMINECTOMY/DECOMPRESSION MICRODISCECTOMY 2 LEVELS;  Surgeon: Eustace Moore, MD;  Location: [place] [facility]UR[facility] ORS;  Service: Neurosurgery;  Laterality: Left;  Lumbar two three laminectomy, left thoracic four five transpedicular diskectomy  . POSTERIOR CERVICAL FUSION/FORAMINOTOMY  04/08/2018   Procedure: Laminectomy and Foraminotomy - Lumbar Four-Lumbar Five, instrumented posterolateral fusion Lumbar Four- Five, Laminectomy and Foraminotomy - Thoracic One-Thoracic Two;  Surgeon: Eustace Moore, MD;  Location: [facility] OR;  Service: Neurosurgery;;  posterior  . POSTERIOR LUMBAR FUSION  04/08/2018   Laminectomy and Foraminotomy - Lumbar Four-Lumbar Five, instrumented posterolateral fusion Lumbar Four- Five, Laminectomy and Foraminotomy - Thoracic One-Thoracic Two  . POSTERIOR LUMBAR FUSION  04/08/2018   Laminectomy and Foraminotomy - Lumbar Four-Lumbar Five, instrumented posterolateral fusion Lumbar Four- Five, Laminectomy and Foraminotomy - Thoracic One-Thoracic Two  . RIGHT HEART CATH [name] 07/18/2019   Procedure: RIGHT HEART CATH;  Surgeon: Jolaine Artist, MD;  Location: [place] [facility] LAB;  Service: Cardiovascular;  Laterality: [name];    Family History  Problem Relation Age of Onset  .  Polycythemia Mother   . Diabetes Father   . Hypertension Father   . Heart disease Father   . CAD Other        1 of his 4 brothers and father has CAD    Social History   Socioeconomic History  . Marital status: Married    Spouse name: carolyn  . Number of children: 3  . Years of education: 67  . Highest education level: Not on file  Occupational History    Employer: [employer]: Works in [place]  . Financial resource strain: Not on file  . Food  insecurity    Worry: Not on file    Inability: Not on file  . Transportation needs    Medical: Not on file    Non-medical: Not on file  Tobacco Use  . Smoking status: Former Smoker    Packs/day: 2.00    Years: 20.00    Pack years: 40.00    Types: Cigarettes    Quit date: 06/28/1985    Years since quitting: 34.2  . Smokeless tobacco: Former Systems developer    Quit date: 06/28/1985  Substance and Sexual Activity  . Alcohol use: No  . Drug use: No  . Sexual activity: Not on file  Lifestyle  . Physical activity    Days per week: Not on file    Minutes per session: Not on file  . Stress: Not on file  Relationships  . Social Herbalist on phone: Not on file    Gets together: Not on file    Attends religious service: Not on file    Active member of club or organization: Not on file    Attends meetings of clubs or organizations: Not on file    Relationship status: Not on file  . Intimate partner violence    Fear of current or ex partner: Not on file    Emotionally abused: Not on file    Physically abused: Not on file    Forced sexual activity: Not on file  Other Topics Concern  . Not on file  Social History Narrative   Lives with wife,  [name]   Patient is married with 3 children.   Patient has 16 yrs of education.   Patient is right handed.   Patient drinks 2 cups of caffeine per day.     Physical Exam  Vital Signs and Nursing Notes reviewed Vitals:   09/15/19 2230 09/15/19 2321  BP: 126/65 136/66  Pulse: 77 79  Resp: (!) 23 (!) 26  SpO2: 94% 91%    CONSTITUTIONAL: Well-appearing, NAD NEURO:  Alert and oriented x 3, no focal deficits EYES:  eyes equal and reactive ENT/NECK:  no LAD, no JVD CARDIO: Regular rate, well-perfused, normal S1 and S2 PULM: Tachypneic, decreased breath sounds bilateral bases, no wheezing GI/GU:  normal bowel sounds, non-distended, non-tender MSK/SPINE:  No gross deformities, no edema SKIN:  no rash, atraumatic PSYCH:  Appropriate speech  and behavior  Diagnostic and Interventional Summary    EKG Interpretation  Date/Time:  Thursday September 15 2019 21:15:23 EDT Ventricular Rate:  82 PR Interval:    QRS Duration: 157 QT Interval:  435 QTC Calculation: 509 R Axis:   73 Text Interpretation:  Sinus rhythm Right bundle branch block Confirmed by Gerlene Fee [phone]) on 09/15/2019 10:05:24 PM      Labs Reviewed  BASIC METABOLIC PANEL - Abnormal; Notable for the following components:  Result Value   Sodium 134 (*)    Chloride 88 (*)    Glucose, Bld 337 (*)    BUN 96 (*)    Creatinine, Ser 2.30 (*)    GFR calc non Af Amer 27 (*)    GFR calc Af Amer 31 (*)    All other components within normal limits  CBC - Abnormal; Notable for the following components:   WBC 10.6 (*)    RDW 15.8 (*)    All other components within normal limits  POCT I-STAT EG7 - Abnormal; Notable for the following components:   pH, Ven 7.438 (*)    pO2, Ven 161.0 (*)    Bicarbonate 38.4 (*)    TCO2 40 (*)    Acid-Base Excess 12.0 (*)    Sodium 133 (*)    Calcium, Ion 1.04 (*)    All other components within normal limits  TROPONIN I (HIGH SENSITIVITY) - Abnormal; Notable for the following components:   Troponin I (High Sensitivity) 18 (*)    All other components within normal limits  SARS CORONAVIRUS 2 (HOSPITAL ORDER, [facility])  BRAIN NATRIURETIC PEPTIDE  LACTIC ACID, PLASMA  TROPONIN I (HIGH SENSITIVITY)    DG Chest Portable 1 View  Final Result      Medications  furosemide (LASIX) 160 mg in dextrose 5 % 50 mL IVPB (160 mg Intravenous New Bag/Given 09/15/19 2313)  sodium chloride flush (NS) 0.9 % injection 3 mL (3 mLs Intravenous Given 09/15/19 2133)     Procedures Critical Care  ED Course and Medical Decision Making  I have reviewed the triage vital signs and the nursing notes.  Pertinent labs & imaging results that were available during my care of the patient were reviewed by me and considered  in my medical decision making (see below for details).  Favoring CHF exacerbation given the lower extremity pitting edema.  Patient is tachypneic, would likely benefit from BiPAP.  Will swab for COVID, labs pending.  Patient with elevated troponin, BNP is within normal limits.  Chest x-ray is without significant edema.  Patient continues to have increased respiratory effort.  Clinically still appears to have a component of CHF exacerbation.  No wheezing to suggest COPD.  Will admit to hospitalist service.  Barth Kirks. Sedonia Small, MD [place] [email or web address]  Final Clinical Impressions(s) / ED Diagnoses     ICD-10-CM   1. Respiratory distress  R06.03   2. Lower extremity edema  R60.0   3. Elevated troponin  R79.89     ED Discharge Orders    None      Discharge Instructions Discussed with and Provided to Patient: Discharge Instructions   None       Maudie Flakes, MD 09/15/19 2349

## 2019-09-15 NOTE — ED Notes (Signed)
Respiratory informed COVID neg and need order for Bipap

## 2019-09-15 NOTE — ED Notes (Signed)
Pharmacy messaged regards missing Lasix Medication

## 2019-09-15 NOTE — Progress Notes (Signed)
RT assisted pt. With setting up his home cpap unit. Pt. Currently wearing our mask with his unit and has 2L of oxygen titrated into the machine.

## 2019-09-15 NOTE — ED Triage Notes (Signed)
Pt. BIB [facility] c/o sudden SOB and CP 1 hour ago while sitting on couch. CP resolved. Pt. Presents with bilateral +4 edema. Pt on home lasix HX CHF on BiPap at home usually oxygen 2-3L. Additional HX COPD  Pt took 325 aspirin at home.  EMS VS SpO2 95% 4L  HR 85 CBG  420 BP 128/78

## 2019-09-15 NOTE — Telephone Encounter (Signed)
Paged by family member regarding chest pain and SOB. Patient being followed by Dr. [name] for CHF. Previous cath in 2016 showed mild to moderate disease in RCA, LCx and LM. Patient's daughter in law has spoken with Dr. Acie Fredrickson who recommended he presents to the [facility] ED for evaluation

## 2019-09-16 DIAGNOSIS — J418 Mixed simple and mucopurulent chronic bronchitis: Secondary | ICD-10-CM | POA: Diagnosis not present

## 2019-09-16 DIAGNOSIS — N390 Urinary tract infection, site not specified: Secondary | ICD-10-CM | POA: Diagnosis present

## 2019-09-16 DIAGNOSIS — R269 Unspecified abnormalities of gait and mobility: Secondary | ICD-10-CM | POA: Diagnosis not present

## 2019-09-16 DIAGNOSIS — L89152 Pressure ulcer of sacral region, stage 2: Secondary | ICD-10-CM | POA: Diagnosis present

## 2019-09-16 DIAGNOSIS — F329 Major depressive disorder, single episode, unspecified: Secondary | ICD-10-CM | POA: Diagnosis present

## 2019-09-16 DIAGNOSIS — E662 Morbid (severe) obesity with alveolar hypoventilation: Secondary | ICD-10-CM | POA: Diagnosis present

## 2019-09-16 DIAGNOSIS — Z20828 Contact with and (suspected) exposure to other viral communicable diseases: Secondary | ICD-10-CM | POA: Diagnosis present

## 2019-09-16 DIAGNOSIS — G4733 Obstructive sleep apnea (adult) (pediatric): Secondary | ICD-10-CM

## 2019-09-16 DIAGNOSIS — R600 Localized edema: Secondary | ICD-10-CM | POA: Diagnosis present

## 2019-09-16 DIAGNOSIS — Z993 Dependence on wheelchair: Secondary | ICD-10-CM | POA: Diagnosis not present

## 2019-09-16 DIAGNOSIS — N183 Chronic kidney disease, stage 3 (moderate): Secondary | ICD-10-CM

## 2019-09-16 DIAGNOSIS — I25110 Atherosclerotic heart disease of native coronary artery with unstable angina pectoris: Secondary | ICD-10-CM | POA: Diagnosis present

## 2019-09-16 DIAGNOSIS — N179 Acute kidney failure, unspecified: Secondary | ICD-10-CM | POA: Diagnosis present

## 2019-09-16 DIAGNOSIS — R079 Chest pain, unspecified: Secondary | ICD-10-CM

## 2019-09-16 DIAGNOSIS — J9622 Acute and chronic respiratory failure with hypercapnia: Secondary | ICD-10-CM | POA: Diagnosis present

## 2019-09-16 DIAGNOSIS — Z87440 Personal history of urinary (tract) infections: Secondary | ICD-10-CM | POA: Diagnosis not present

## 2019-09-16 DIAGNOSIS — R0603 Acute respiratory distress: Secondary | ICD-10-CM | POA: Diagnosis not present

## 2019-09-16 DIAGNOSIS — J438 Other emphysema: Secondary | ICD-10-CM

## 2019-09-16 DIAGNOSIS — Z794 Long term (current) use of insulin: Secondary | ICD-10-CM

## 2019-09-16 DIAGNOSIS — N400 Enlarged prostate without lower urinary tract symptoms: Secondary | ICD-10-CM | POA: Diagnosis present

## 2019-09-16 DIAGNOSIS — X58XXXA Exposure to other specified factors, initial encounter: Secondary | ICD-10-CM | POA: Diagnosis present

## 2019-09-16 DIAGNOSIS — E1165 Type 2 diabetes mellitus with hyperglycemia: Secondary | ICD-10-CM

## 2019-09-16 DIAGNOSIS — J9621 Acute and chronic respiratory failure with hypoxia: Secondary | ICD-10-CM | POA: Diagnosis present

## 2019-09-16 DIAGNOSIS — N184 Chronic kidney disease, stage 4 (severe): Secondary | ICD-10-CM | POA: Diagnosis present

## 2019-09-16 DIAGNOSIS — M5030 Other cervical disc degeneration, unspecified cervical region: Secondary | ICD-10-CM | POA: Diagnosis present

## 2019-09-16 DIAGNOSIS — E039 Hypothyroidism, unspecified: Secondary | ICD-10-CM | POA: Diagnosis present

## 2019-09-16 DIAGNOSIS — R778 Other specified abnormalities of plasma proteins: Secondary | ICD-10-CM

## 2019-09-16 DIAGNOSIS — R7989 Other specified abnormal findings of blood chemistry: Secondary | ICD-10-CM | POA: Diagnosis not present

## 2019-09-16 DIAGNOSIS — Z713 Dietary counseling and surveillance: Secondary | ICD-10-CM | POA: Diagnosis not present

## 2019-09-16 DIAGNOSIS — Z9989 Dependence on other enabling machines and devices: Secondary | ICD-10-CM

## 2019-09-16 DIAGNOSIS — B349 Viral infection, unspecified: Secondary | ICD-10-CM | POA: Diagnosis present

## 2019-09-16 DIAGNOSIS — I130 Hypertensive heart and chronic kidney disease with heart failure and stage 1 through stage 4 chronic kidney disease, or unspecified chronic kidney disease: Secondary | ICD-10-CM | POA: Diagnosis present

## 2019-09-16 DIAGNOSIS — Z6841 Body Mass Index (BMI) 40.0 and over, adult: Secondary | ICD-10-CM | POA: Diagnosis not present

## 2019-09-16 DIAGNOSIS — J441 Chronic obstructive pulmonary disease with (acute) exacerbation: Secondary | ICD-10-CM

## 2019-09-16 DIAGNOSIS — F419 Anxiety disorder, unspecified: Secondary | ICD-10-CM | POA: Diagnosis present

## 2019-09-16 DIAGNOSIS — I5032 Chronic diastolic (congestive) heart failure: Secondary | ICD-10-CM | POA: Diagnosis present

## 2019-09-16 LAB — COMPREHENSIVE METABOLIC PANEL
ALT: 18 U/L (ref 0–44)
AST: 17 U/L (ref 15–41)
Albumin: 3.6 g/dL (ref 3.5–5.0)
Alkaline Phosphatase: 91 U/L (ref 38–126)
Anion gap: 17 — ABNORMAL HIGH (ref 5–15)
BUN: 92 mg/dL — ABNORMAL HIGH (ref 8–23)
CO2: 31 mmol/L (ref 22–32)
Calcium: 9.3 mg/dL (ref 8.9–10.3)
Chloride: 89 mmol/L — ABNORMAL LOW (ref 98–111)
Creatinine, Ser: 2.18 mg/dL — ABNORMAL HIGH (ref 0.61–1.24)
GFR calc Af Amer: 33 mL/min — ABNORMAL LOW (ref >60)
GFR calc non Af Amer: 29 mL/min — ABNORMAL LOW (ref >60)
Glucose, Bld: 243 mg/dL — ABNORMAL HIGH (ref 70–99)
Potassium: 4.1 mmol/L (ref 3.5–5.1)
Sodium: 137 mmol/L (ref 135–145)
Total Bilirubin: 0.7 mg/dL (ref 0.3–1.2)
Total Protein: 8 g/dL (ref 6.5–8.1)

## 2019-09-16 LAB — CBC
HCT: 42.7 % (ref 39.0–52.0)
Hemoglobin: 14 g/dL (ref 13.0–17.0)
MCH: 29.9 pg (ref 26.0–34.0)
MCHC: 32.8 g/dL (ref 30.0–36.0)
MCV: 91 fL (ref 80.0–100.0)
Platelets: 196 10*3/uL (ref 150–400)
RBC: 4.69 MIL/uL (ref 4.22–5.81)
RDW: 15.8 % — ABNORMAL HIGH (ref 11.5–15.5)
WBC: 9.2 10*3/uL (ref 4.0–10.5)
nRBC: 0 % (ref 0.0–0.2)

## 2019-09-16 LAB — CBG MONITORING, ED
Glucose-Capillary: 267 mg/dL — ABNORMAL HIGH (ref 70–99)
Glucose-Capillary: 306 mg/dL — ABNORMAL HIGH (ref 70–99)
Glucose-Capillary: 359 mg/dL — ABNORMAL HIGH (ref 70–99)

## 2019-09-16 LAB — GLUCOSE, CAPILLARY
Glucose-Capillary: 331 mg/dL — ABNORMAL HIGH (ref 70–99)
Glucose-Capillary: 330 mg/dL — ABNORMAL HIGH (ref 70–99)

## 2019-09-16 LAB — TSH: TSH: 2.222 u[IU]/mL (ref 0.350–4.500)

## 2019-09-16 LAB — TROPONIN I (HIGH SENSITIVITY): Troponin I (High Sensitivity): 18 ng/L — ABNORMAL HIGH (ref <18)

## 2019-09-16 MED ORDER — INSULIN ASPART 100 UNIT/ML ~~LOC~~ SOLN: 0.0000 [IU] | Freq: Three times a day (TID) | SUBCUTANEOUS | Status: DC

## 2019-09-16 MED ORDER — LEVOTHYROXINE SODIUM 75 MCG PO TABS
175.0000 ug | ORAL_TABLET | Freq: Every day | ORAL | Status: DC
  Administered 2019-09-17: 175 ug via ORAL
  Filled 2019-09-16: qty 1

## 2019-09-16 MED ORDER — INSULIN NPH (HUMAN) (ISOPHANE) 100 UNIT/ML ~~LOC~~ SUSP
10.0000 [IU] | Freq: Once | SUBCUTANEOUS | Status: AC
  Administered 2019-09-16: 10 [IU] via SUBCUTANEOUS
  Filled 2019-09-16: qty 10

## 2019-09-16 MED ORDER — ESCITALOPRAM OXALATE 10 MG PO TABS
20.0000 mg | ORAL_TABLET | Freq: Every day | ORAL | Status: DC
  Administered 2019-09-16 – 2019-09-17 (×2): 20 mg via ORAL
  Filled 2019-09-16 (×2): qty 2

## 2019-09-16 MED ORDER — ATORVASTATIN CALCIUM 10 MG PO TABS
20.0000 mg | ORAL_TABLET | Freq: Every evening | ORAL | Status: DC
  Administered 2019-09-16: 20 mg via ORAL
  Filled 2019-09-16: qty 2

## 2019-09-16 MED ORDER — POTASSIUM CHLORIDE CRYS ER 20 MEQ PO TBCR
20.0000 meq | EXTENDED_RELEASE_TABLET | Freq: Three times a day (TID) | ORAL | Status: DC
  Administered 2019-09-16 – 2019-09-17 (×4): 20 meq via ORAL
  Filled 2019-09-16 (×4): qty 1

## 2019-09-16 MED ORDER — METHYLPREDNISOLONE SODIUM SUCC 125 MG IJ SOLR
60.0000 mg | Freq: Four times a day (QID) | INTRAMUSCULAR | Status: DC
  Administered 2019-09-16: 60 mg via INTRAVENOUS
  Filled 2019-09-16: qty 2

## 2019-09-16 MED ORDER — INSULIN ASPART 100 UNIT/ML ~~LOC~~ SOLN: 0.0000 [IU] | Freq: Every day | SUBCUTANEOUS | Status: DC

## 2019-09-16 MED ORDER — METOLAZONE 2.5 MG PO TABS
2.5000 mg | ORAL_TABLET | Freq: Every day | ORAL | Status: DC
  Administered 2019-09-16: 2.5 mg via ORAL
  Filled 2019-09-16 (×2): qty 1

## 2019-09-16 MED ORDER — METHYLPREDNISOLONE SODIUM SUCC 125 MG IJ SOLR
60.0000 mg | Freq: Two times a day (BID) | INTRAMUSCULAR | Status: DC
  Administered 2019-09-16 – 2019-09-17 (×2): 60 mg via INTRAVENOUS
  Filled 2019-09-16 (×2): qty 2

## 2019-09-16 MED ORDER — INSULIN GLARGINE 100 UNIT/ML ~~LOC~~ SOLN
60.0000 [IU] | Freq: Every day | SUBCUTANEOUS | Status: DC
  Administered 2019-09-16: 60 [IU] via SUBCUTANEOUS
  Filled 2019-09-16 (×2): qty 0.6

## 2019-09-16 MED ORDER — INSULIN GLARGINE 100 UNIT/ML ~~LOC~~ SOLN
80.0000 [IU] | Freq: Every day | SUBCUTANEOUS | Status: DC
  Administered 2019-09-16: 80 [IU] via SUBCUTANEOUS
  Filled 2019-09-16 (×2): qty 0.8

## 2019-09-16 MED ORDER — HYDRALAZINE HCL 25 MG PO TABS
37.5000 mg | ORAL_TABLET | Freq: Three times a day (TID) | ORAL | Status: DC
  Administered 2019-09-16 – 2019-09-17 (×4): 37.5 mg via ORAL
  Filled 2019-09-16 (×4): qty 2

## 2019-09-16 MED ORDER — ADULT MULTIVITAMIN W/MINERALS CH
1.0000 | ORAL_TABLET | Freq: Every day | ORAL | Status: DC
  Administered 2019-09-16 – 2019-09-17 (×2): 1 via ORAL
  Filled 2019-09-16 (×2): qty 1

## 2019-09-16 MED ORDER — INSULIN ASPART 100 UNIT/ML ~~LOC~~ SOLN
18.0000 [IU] | Freq: Three times a day (TID) | SUBCUTANEOUS | Status: DC
  Administered 2019-09-16 – 2019-09-17 (×5): 18 [IU] via SUBCUTANEOUS

## 2019-09-16 MED ORDER — FESOTERODINE FUMARATE ER 8 MG PO TB24
8.0000 mg | ORAL_TABLET | Freq: Every day | ORAL | Status: DC
  Administered 2019-09-16: 8 mg via ORAL
  Filled 2019-09-16 (×2): qty 1

## 2019-09-16 MED ORDER — ONDANSETRON HCL 4 MG/2ML IJ SOLN: 4.0000 mg | Freq: Four times a day (QID) | INTRAMUSCULAR | Status: DC | PRN

## 2019-09-16 MED ORDER — PRAMIPEXOLE DIHYDROCHLORIDE 0.25 MG PO TABS
0.5000 mg | ORAL_TABLET | Freq: Every day | ORAL | Status: DC
  Administered 2019-09-17: 10:00:00 0.5 mg via ORAL
  Filled 2019-09-16 (×2): qty 2

## 2019-09-16 MED ORDER — LEVOTHYROXINE SODIUM 75 MCG PO TABS
175.0000 ug | ORAL_TABLET | Freq: Every day | ORAL | Status: DC
  Administered 2019-09-16: 175 ug via ORAL
  Filled 2019-09-16: qty 1

## 2019-09-16 MED ORDER — INSULIN ASPART 100 UNIT/ML ~~LOC~~ SOLN
0.0000 [IU] | Freq: Three times a day (TID) | SUBCUTANEOUS | Status: DC
  Administered 2019-09-16: 15 [IU] via SUBCUTANEOUS

## 2019-09-16 MED ORDER — HEPARIN SODIUM (PORCINE) 5000 UNIT/ML IJ SOLN
5000.0000 [IU] | Freq: Three times a day (TID) | INTRAMUSCULAR | Status: DC
  Administered 2019-09-16 – 2019-09-17 (×3): 5000 [IU] via SUBCUTANEOUS
  Filled 2019-09-16 (×3): qty 1

## 2019-09-16 MED ORDER — ONDANSETRON HCL 4 MG PO TABS: 4.0000 mg | ORAL_TABLET | Freq: Four times a day (QID) | ORAL | Status: DC | PRN

## 2019-09-16 MED ORDER — TAMSULOSIN HCL 0.4 MG PO CAPS
0.4000 mg | ORAL_CAPSULE | Freq: Every day | ORAL | Status: DC
  Administered 2019-09-16: 0.4 mg via ORAL
  Filled 2019-09-16 (×2): qty 1

## 2019-09-16 MED ORDER — INSULIN ASPART 100 UNIT/ML ~~LOC~~ SOLN
0.0000 [IU] | Freq: Three times a day (TID) | SUBCUTANEOUS | Status: DC
  Administered 2019-09-16: 15 [IU] via SUBCUTANEOUS
  Administered 2019-09-16 – 2019-09-17 (×2): 20 [IU] via SUBCUTANEOUS
  Administered 2019-09-17: 15 [IU] via SUBCUTANEOUS

## 2019-09-16 MED ORDER — TORSEMIDE 100 MG PO TABS
100.0000 mg | ORAL_TABLET | Freq: Two times a day (BID) | ORAL | Status: DC
  Administered 2019-09-16 (×2): 100 mg via ORAL
  Filled 2019-09-16: qty 1
  Filled 2019-09-16: qty 5

## 2019-09-16 MED ORDER — ASPIRIN EC 81 MG PO TBEC
81.0000 mg | DELAYED_RELEASE_TABLET | Freq: Every day | ORAL | Status: DC
  Administered 2019-09-16 – 2019-09-17 (×2): 81 mg via ORAL
  Filled 2019-09-16 (×2): qty 1

## 2019-09-16 MED ORDER — PRAMIPEXOLE DIHYDROCHLORIDE 1 MG PO TABS
1.0000 mg | ORAL_TABLET | Freq: Every day | ORAL | Status: DC
  Administered 2019-09-16: 1 mg via ORAL
  Filled 2019-09-16 (×2): qty 1

## 2019-09-16 MED ORDER — INSULIN ASPART 100 UNIT/ML ~~LOC~~ SOLN
0.0000 [IU] | Freq: Every day | SUBCUTANEOUS | Status: DC
  Administered 2019-09-16: 3 [IU] via SUBCUTANEOUS
  Administered 2019-09-16: 4 [IU] via SUBCUTANEOUS

## 2019-09-16 MED ORDER — GABAPENTIN 400 MG PO CAPS
400.0000 mg | ORAL_CAPSULE | Freq: Three times a day (TID) | ORAL | Status: DC
  Administered 2019-09-16 – 2019-09-17 (×4): 400 mg via ORAL
  Filled 2019-09-16 (×4): qty 1

## 2019-09-16 MED ORDER — SODIUM CHLORIDE 0.9 % IV SOLN
1.0000 g | INTRAVENOUS | Status: DC
  Administered 2019-09-16: 1 g via INTRAVENOUS
  Filled 2019-09-16: qty 10

## 2019-09-16 MED ORDER — SPIRONOLACTONE 25 MG PO TABS
25.0000 mg | ORAL_TABLET | Freq: Every day | ORAL | Status: DC
  Administered 2019-09-16: 25 mg via ORAL
  Filled 2019-09-16 (×2): qty 1

## 2019-09-16 MED ORDER — POLYETHYLENE GLYCOL 3350 17 G PO PACK
17.0000 g | PACK | Freq: Every day | ORAL | Status: DC
  Administered 2019-09-16 (×2): 17 g via ORAL
  Filled 2019-09-16 (×2): qty 1

## 2019-09-16 MED ORDER — IPRATROPIUM-ALBUTEROL 0.5-2.5 (3) MG/3ML IN SOLN
3.0000 mL | Freq: Four times a day (QID) | RESPIRATORY_TRACT | Status: DC
  Administered 2019-09-16 – 2019-09-17 (×5): 3 mL via RESPIRATORY_TRACT
  Filled 2019-09-16 (×5): qty 3

## 2019-09-16 NOTE — Progress Notes (Signed)
RT assisted pt. With placing on his home cpap unit. Pt. Has 3L of oxygen titrated into the cpap. Tolerating well at this time.

## 2019-09-16 NOTE — ED Notes (Signed)
Daughter in law Chrisy updated

## 2019-09-16 NOTE — Consult Note (Addendum)
The patient has been seen in conjunction with Lyda Jester, PAC. All aspects of care have been considered and discussed. The patient has been personally interviewed, examined, and all clinical data has been reviewed.   Agree with the note as written with the following exception: I do not believe the patient needs an ischemic work-up.  Troponin delta is reassuring.  EKG has not changed.  An ischemic evaluation could potentially lead to unnecessary further evaluation.  Coronary angiography associated with a high risk for acute kidney injury.  Therefore, unless recurrent chest pain, active EKG changes suggestive of ischemia, or a pattern of enzyme elevation indicative of type I injury would recommend conservative management.  The patient does not appear to be in significant heart failure and in fact is relatively intravascularly volume depleted.  I would however continue his diuretic regimen as ordered by the advanced heart failure team who follows the patient closely.  If any concerns about heart failure regimen, please contact them (Dr. [name] MD/Clegg NP).   Cardiology Consultation:   Patient ID: Travis [name] MRN: [id number]; DOB: 1946-09-13  Admit date: 09/15/2019 Date of Consult: 09/16/2019  Primary Care Provider: [name], MD Primary Cardiologist: Mertie Moores, MD  Primary Electrophysiologist:  None  AHFC: Dr. [name]    Patient Profile:   Travis [name] is a 73 y.o. male, primarily wheelchair dependent, with a hx of chronic diastolic HF, HTN, hypothyroidism, obesity, OSA on CPAP, T2DM, COPD on chronic home O2, stage III CKD, recurrent UTIs and nonobstructive CAD by cath in 2016, admitted for acute COPD exacerbation, who is being seen today for the evaluation of chest pain, at the request of Dr. Wynelle Cleveland, Internal Medicine.  History of Present Illness:   Travis [name] is a 74 y/o WM, primarily wheelchair dependent, w/ a history of diastolic heart failure, HTN,  hypothyroidism, obesity, OSA, DMII, COPD on chronic oxygen (2.5 L/min baseline), nonobstructive CAD, spinal stenosis and recurrent UTIs.He is followed by the [facility]. His daughter-in law is a Therapist, sports that works on [employer].    His last ischemic evaluation was a LHC in 08/2015 that showed mild-moderate nonobstructive CAD w/ 40% prox-mid RCA lesion, 50% ostial-prox Cx,  40% LM lesion and 40% distal LAD, treated medically.   Admitted 5/2-5/13/20 with volume overload. Diuresed with lasix drip and metolazone. Had AKI that improved with switching back to oral diuretics. Also treated for cellulitis with doxy. PT recommended SNF, but he refused. DC weight 281 lbs. He was discharged on torsemide 80 mg daily with 2.5 mg metolazone weekly. Discharged with [facility].   Admitted 5/62/13 with A/C diastolic HF. Diuresed with lasix drip and later transitioned to torsemide 80 mg twice a day + metolazone twice a week. [place] 7/20 showed mild PAH and well compensated filling pressures and output. Discharge weight was 285 pounds (dry weight 285-287). Re-admitted 07/21/19 with AMS. He was treated for UTI and sent home the next day.  Most recently, he has required increase in his home diuretics due to increased volume overload and exertional dyspnea. 08/03/19, he received a dose of IV Lasix in the [facility] and his home torsemide was increased to 100 mg BID and metolazone increased to 2 days a week. He was seen back in f/u on 9/2 and reported some improvement but still w/ significant volume overload. After consultation w/ Dr. [name], his metolazone was increased to once daily w/ recommendations to closely follow renal function and K with weekly BMPs x 3 weeks. On his return f/u on  9/10, he was doing well w/ steady weight loss and improvement in symptoms. His weight was down from 293 lb to 285 lb. BMP showed stable renal function w/ SCr at 2.2, c/w baseline, and K was normal at 3.8 w/ daily metolazone.   Pt also reports that he was  recently diagnosed w/ a recurrent UTI and his PCP placed him on a 10 day course of antibiotics. Today is day 5/10.   09/15/19, pt's family member called on-call provider after clinic hours w/ complaint of chest pain and dyspnea. Pt advised to go to the ED. Per pt report, he has been dealing with progressive exertional dyspnea, that has not improved w/ diuresis. He is primarily wheelchair dependent but does occasionally uses a walker to ambulate around his home. No resting dyspnea. He has had to increase his supplemental O2 to 3L/min w/ ambulation. Yesterday evening, after eating dinner, he developed anterior CP w/ radiation to his back/mid scapular area. Occurred around 6PM at rest. Described as pressure. No radiation. Occurred shortly after eating a burger w/ potatoes but did not feel like indigestion. Pain lasted ~30 min and resolved spontaneously w/o any recurrence. His wife reported that he did not look well. He looked very weak. After discussion w/ the on-call provider, she brought him to the ED.   In the ED, pt observered to be in mild  respiratory distress, noted to have mild wheezing and little air movement on lung exam. EKG showed NSR 65 bpm w/ chronic RBBB. Hs Troponin 18>>18. BNP normal at 28. CXR with no acute cardiopulmonary processes. COVID negative. CBC w/ mild leukocytosis w/ WBC ct at 10 but aebrile. WBC ct WNL today at 9.2. No anemia. Hgb 14. Initial BMP in the ED, showed slight increase in SCr up to 2.3, however improved today at 2.18. K normal at 4.1. Blood glucose elevated in the 200-300s.   Pt admitted by IM for acute COPD exacerbation and started on steroids, nebulizers and antibiotics. Cardiology consulted for CP. Pt remains in the ED currently. On CPAP. NSR on tele. BP stable. Tachypenic. RR 22. Afebrile but with rigors. "Cant get warm". Wife states that this usually happens when he has a UTI. He has been fully compliant w/ antibiotics that were recently prescribed. RN at beside and  also reports a decubitus ulcer.    Heart Pathway Score:     Past Medical History:  Diagnosis Date   Cervical myelopathy (HCC)    with myelomalacia at C5-6 and C3-4   CHF (congestive heart failure) (HCC)    Constipation - functional    controlled with miralax   COPD (chronic obstructive pulmonary disease) (HCC)    mild   DJD (degenerative joint disease)    WHOLE SPINE   Dyspnea    Gait disorder    Heart murmur    years ago    Hypertension    Hypothyroidism    Low d/t treatment of hyperthyroidism   Lumbosacral spinal stenosis 03/13/2014   On home oxygen therapy    "2.5L all the time" (06/29/2018)   Peripheral neuropathy    Pneumonia    PONV (postoperative nausea and vomiting)    RBBB    Renal disorder    acute kidney failure   Restless legs syndrome 03/14/2015   RLS (restless legs syndrome)    Sleep apnea    DX  2009/2010 study done at [facility]   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   Type II diabetes mellitus ([place])  diet controlled    Past Surgical History:  Procedure Laterality Date   BACK SURGERY     6 surgeries   CARDIAC CATHETERIZATION [name] 09/07/2015   Procedure: Right/Left Heart Cath and Coronary Angiography;  Surgeon: Peter M Martinique, MD;  Location: [facility];  Service: Cardiovascular;  Laterality: [name];   CHOLECYSTECTOMY     EYE SURGERY     BIL CATARACTS   LUMBAR LAMINECTOMY/DECOMPRESSION MICRODISCECTOMY  07/14/2012   Procedure: LUMBAR LAMINECTOMY/DECOMPRESSION MICRODISCECTOMY 2 LEVELS;  Surgeon: Eustace Moore, MD;  Location: [facility];  Service: Neurosurgery;  Laterality: Left;  Lumbar two three laminectomy, left thoracic four five transpedicular diskectomy   POSTERIOR CERVICAL FUSION/FORAMINOTOMY  04/08/2018   Procedure: Laminectomy and Foraminotomy - Lumbar Four-Lumbar Five, instrumented posterolateral fusion Lumbar Four- Five, Laminectomy and Foraminotomy - Thoracic One-Thoracic Two;  Surgeon: Eustace Moore, MD;  Location:  [facility];  Service: Neurosurgery;;  posterior   POSTERIOR LUMBAR FUSION  04/08/2018   Laminectomy and Foraminotomy - Lumbar Four-Lumbar Five, instrumented posterolateral fusion Lumbar Four- Five, Laminectomy and Foraminotomy - Thoracic One-Thoracic Two   POSTERIOR LUMBAR FUSION  04/08/2018   Laminectomy and Foraminotomy - Lumbar Four-Lumbar Five, instrumented posterolateral fusion Lumbar Four- Five, Laminectomy and Foraminotomy - Thoracic One-Thoracic Two   RIGHT HEART CATH [name] 07/18/2019   Procedure: RIGHT HEART CATH;  Surgeon: Jolaine Artist, MD;  Location: [facility];  Service: Cardiovascular;  Laterality: [name];     Home Medications:  Prior to Admission medications   Medication Sig Start Date End Date Taking? Authorizing Provider  aspirin EC 81 MG tablet Take 81 mg by mouth daily.   Yes [provider]  atorvastatin (LIPITOR) 20 MG tablet Take 20 mg by mouth every evening.    Yes [provider]  cephALEXin (KEFLEX) 500 MG capsule Take 500 mg by mouth every 8 (eight) hours. FOR 10 DAYS 09/08/19 09/18/19 Yes [provider]  escitalopram (LEXAPRO) 20 MG tablet Take 20 mg by mouth daily.   Yes [provider]  gabapentin (NEURONTIN) 400 MG capsule Take 400 mg by mouth 3 (three) times daily.   Yes [provider]  hydrALAZINE (APRESOLINE) 25 MG tablet Take 1.5 tablets (37.5 mg total) by mouth 3 (three) times daily. 08/22/19  Yes Clegg, Amy D, NP  insulin aspart (NOVOLOG FLEXPEN) 100 UNIT/ML FlexPen Inject 18 Units into the skin 3 (three) times daily with meals. 03/07/19  Yes Shamleffer, Melanie Crazier, MD  Insulin Glargine (LANTUS SOLOSTAR) 100 UNIT/ML Solostar Pen Inject 60 Units into the skin at bedtime. 03/04/19  Yes Shamleffer, Melanie Crazier, MD  levothyroxine (SYNTHROID, LEVOTHROID) 175 MCG tablet Take 1 tablet (175 mcg total) by mouth daily. 05/12/18  Yes Angiulli, Lavon Paganini, [name]  metolazone (ZAROXOLYN) 2.5 MG tablet Take 1 tablet (2.5 mg  total) by mouth daily. 08/31/19  Yes Lyda Jester M, [name]  multivitamin (ONE-A-DAY MEN'S) TABS tablet Take 1 tablet by mouth daily.    Yes [provider]  OXYGEN Inhale 2-3 L/min into the lungs continuous.    Yes [provider]  polyethylene glycol (MIRALAX / GLYCOLAX) packet Take 17 g by mouth daily as needed. For constipation Patient taking differently: Take 17 g by mouth at bedtime.  08/09/14  Yes Love, Ivan Anchors, [name]  potassium chloride SA (K-DUR) 20 MEQ tablet Take 1 tablet (20 mEq total) by mouth 2 (two) times daily. Take 1 tab only on days you take Metolazone Patient taking differently: Take 20 mEq by mouth  3 (three) times daily.  08/22/19  Yes Clegg, Amy D, NP  pramipexole (MIRAPEX) 0.5 MG tablet One tablet in the morning and 2 in the evening Patient taking differently: Take 0.5-1 mg by mouth See admin instructions. Take 0.5 mg by mouth in the morning and 1 mg at bedtime 11/24/18  Yes Kathrynn Ducking, MD  spironolactone (ALDACTONE) 25 MG tablet Take 1 tablet (25 mg total) by mouth daily. 10/29/18  Yes Nahser, Wonda Cheng, MD  tamsulosin (FLOMAX) 0.4 MG CAPS capsule Take 1 capsule (0.4 mg total) by mouth daily. Patient taking differently: Take 0.4 mg by mouth at bedtime.  05/12/18  Yes Angiulli, Lavon Paganini, [name]  torsemide (DEMADEX) 100 MG tablet Take 1 tablet (100 mg total) by mouth 2 (two) times daily. 08/04/19  Yes Clegg, Amy D, NP  TOVIAZ 8 MG TB24 tablet Take 1 tablet (8 mg total) by mouth daily. Patient taking differently: Take 8 mg by mouth at bedtime.  05/12/18  Yes Angiulli, Lavon Paganini, [name]  Blood Glucose Monitoring Suppl (ONETOUCH VERIO) w/Device KIT 1 kit by Does not apply route 4 (four) times daily. Use as directed to test blood sugars 4 times daily DX E11.42 03/04/19   Shamleffer, Melanie Crazier, MD  Continuous Blood Gluc Receiver (FREESTYLE LIBRE 14 DAY READER) DEVI 1 kit by Does not apply route 3 (three) times daily. Patient not taking: Reported on 09/15/2019 02/07/19    Shamleffer, Melanie Crazier, MD  Continuous Blood Gluc Sensor (FREESTYLE LIBRE 14 DAY SENSOR) MISC 1 kit by Does not apply route 3 (three) times daily. Patient not taking: Reported on 09/15/2019 02/07/19   Shamleffer, Melanie Crazier, MD  glucose blood test strip Use as instructed to test blood sugars 4 times daily DX E11.42 03/04/19   Shamleffer, Melanie Crazier, MD  Insulin Pen Needle (BD PEN NEEDLE MICRO U/F) 32G X 6 MM MISC Four times daily 02/04/19   Shamleffer, Melanie Crazier, MD  Insulin Pen Needle (BD PEN NEEDLE MICRO U/F) 32G X 6 MM MISC 4 times a day 03/04/19   Shamleffer, Melanie Crazier, MD  Lancets [facility] ULTRASOFT) lancets Use as instructed to test blood sugars 4 times daily DX E11.42 03/04/19   Shamleffer, Melanie Crazier, MD    Inpatient Medications: Scheduled Meds:  aspirin EC  81 mg Oral Daily   atorvastatin  20 mg Oral QPM   escitalopram  20 mg Oral Daily   fesoterodine  8 mg Oral QHS   gabapentin  400 mg Oral TID   heparin  5,000 Units Subcutaneous Q8H   hydrALAZINE  37.5 mg Oral TID   insulin aspart  0-20 Units Subcutaneous TID WC   insulin aspart  0-5 Units Subcutaneous QHS   insulin aspart  18 Units Subcutaneous TID WC   insulin glargine  60 Units Subcutaneous QHS   ipratropium-albuterol  3 mL Nebulization Q6H   levothyroxine  175 mcg Oral Daily   methylPREDNISolone (SOLU-MEDROL) injection  60 mg Intravenous Q12H   metolazone  2.5 mg Oral Daily   multivitamin with minerals  1 tablet Oral Daily   polyethylene glycol  17 g Oral QHS   potassium chloride SA  20 mEq Oral TID   pramipexole  0.5 mg Oral Daily   pramipexole  1 mg Oral QHS   spironolactone  25 mg Oral Daily   tamsulosin  0.4 mg Oral QHS   torsemide  100 mg Oral BID   Continuous Infusions:  cefTRIAXone (ROCEPHIN)  IV Stopped (09/16/19 0236)   PRN Meds:  ondansetron **OR** ondansetron (ZOFRAN) IV  Allergies:    Allergies  Allergen Reactions   Amlodipine Swelling and Other  (See Comments)    Causes edema (per [facility] paperwork)   Plendil [Felodipine] Swelling and Other (See Comments)    Causes edema (per [facility] paperwork)   Compazine [Prochlorperazine] Other (See Comments)    Caused agitation (per [facility] paperwork)   Cymbalta [Duloxetine Hcl] Other (See Comments)    Made the patient "feel weird," per [facility] paperwork   Jardiance [Empagliflozin] Other (See Comments)    Stopped by MD because of unwanted side effects   Other Nausea Only and Other (See Comments)    ANESTHESIA UNSPECIFIED- makes sick, must have anti-nausea medication in advance   Levofloxacin In D5w Itching    Possible infusion reaction 03/29/17. Tolerating oral Levaquin 03/30/17   Septra [Sulfamethoxazole-Trimethoprim] Nausea Only and Other (See Comments)    Per [facility] paperwork    Social History:   Social History   Socioeconomic History   Marital status: Married    Spouse name: carolyn   Number of children: 3   Years of education: 16   Highest education level: Not on file  Occupational History    Employer: [employer]    Comment: Works in [place] resource strain: Not on file   Food insecurity    Worry: Not on file    Inability: Not on Lexicographer needs    Medical: Not on file    Non-medical: Not on file  Tobacco Use   Smoking status: Former Smoker    Packs/day: 2.00    Years: 20.00    Pack years: 40.00    Types: Cigarettes    Quit date: 06/28/1985    Years since quitting: 34.2   Smokeless tobacco: Former Systems developer    Quit date: 06/28/1985  Substance and Sexual Activity   Alcohol use: No   Drug use: No   Sexual activity: Not on file  Lifestyle   Physical activity    Days per week: Not on file    Minutes per session: Not on file   Stress: Not on file  Relationships   Social connections    Talks on phone: Not on file    Gets together: Not on file     Attends religious service: Not on file    Active member of club or organization: Not on file    Attends meetings of clubs or organizations: Not on file    Relationship status: Not on file   Intimate partner violence    Fear of current or ex partner: Not on file    Emotionally abused: Not on file    Physically abused: Not on file    Forced sexual activity: Not on file  Other Topics Concern   Not on file  Social History Narrative   Lives with wife,  [name]   Patient is married with 3 children.   Patient has 16 yrs of education.   Patient is right handed.   Patient drinks 2 cups of caffeine per day.    Family History:    Family History  Problem Relation Age of Onset   Polycythemia Mother    Diabetes Father    Hypertension Father    Heart disease Father    CAD Other        67 of his 76 brothers and father has CAD     ROS:  Please see  the history of present illness.   All other ROS reviewed and negative.     Physical Exam/Data:   Vitals:   09/16/19 0815 09/16/19 0830 09/16/19 0900 09/16/19 0930  BP: (!) 121/112 140/64 133/63 (!) 141/62  Pulse:  73  73  Resp: (!) 41 20 (!) 28 (!) 33  Temp:      TempSrc:      SpO2:  92%  92%  Weight:      Height:        Intake/Output Summary (Last 24 hours) at 09/16/2019 1106 Last data filed at 09/16/2019 0555 Gross per 24 hour  Intake --  Output 820 ml  Net -820 ml   Last 3 Weights 09/15/2019 09/08/2019 08/31/2019  Weight (lbs) 285 lb 285 lb 293 lb 3.2 oz  Weight (kg) 129.275 kg 129.275 kg 132.995 kg     Body mass index is 37.6 kg/m.  General:  Obese, elderly WM, w/ rigors but no acute respiratory distress HEENT: normal Lymph: no adenopathy Neck: no JVD Endocrine:  No thryomegaly Vascular: No carotid bruits; FA pulses 2+ bilaterally without bruits  Cardiac:  normal S1, S2; RRR; no murmur  Lungs:  clear to auscultation bilaterally, no wheezing, rhonchi or rales  Abd: obese abdomen, soft, nontender, no hepatomegaly  Ext:  2+ bilateral LE pitting edema Musculoskeletal:  No deformities, BUE and BLE strength normal and equal Skin: warm and dry  Neuro:  CNs 2-12 intact, no focal abnormalities noted Psych:  Normal affect   EKG:  The EKG was personally reviewed and demonstrates:  NSR w/ RBBB 80s Telemetry:  Telemetry was personally reviewed and demonstrates:  NSR 82 bpm w/ chronic RBBB  Relevant CV Studies: [facility] 09/07/2015 Right/Left Heart Cath and Coronary Angiography  Conclusion   Prox RCA to Mid RCA lesion, 40% stenosed.  Ost Cx to Prox Cx lesion, 50% stenosed.  LM lesion, 40% stenosed.  The left ventricular systolic function is normal.  Dist LAD lesion, 40% stenosed.   1. Nonobstructive CAD 2. Normal LV function. 3. Mild pulmonary HTN with normal LV filling pressures.   Plan: medical therapy.   Coronary Diagrams  Diagnostic Dominance: Right    RHC only 07/18/19 Conclusion  Findings:  RA = 8 RV = 42/9 PA = 44/11 (26) PCW = 13 Fick cardiac output/index = 8.8/3.5 PVR = 1.5 WU Ao sat = 93% PA sat = 79%, 71%  Assessment: 1. Mild PAH 2. Well compensated filling pressures and output   Plan/Discussion:  Can go home today on previous home diuretic regimen but would increase metoalzone to twice a week (Monday and Friday). Dry weight now 285-287.    Laboratory Data:  High Sensitivity Troponin:   Recent Labs  Lab 09/15/19 2122 09/15/19 2322  TROPONINIHS 18* 18*     Chemistry Recent Labs  Lab 09/15/19 2122 09/15/19 2209 09/16/19 0403  NA 134* 133* 137  K 4.4 4.1 4.1  CL 88*  --  89*  CO2 31  --  31  GLUCOSE 337*  --  243*  BUN 96*  --  92*  CREATININE 2.30*  --  2.18*  CALCIUM 9.0  --  9.3  GFRNONAA 27*  --  29*  GFRAA 31*  --  33*  ANIONGAP 15  --  17*    Recent Labs  Lab 09/16/19 0403  PROT 8.0  ALBUMIN 3.6  AST 17  ALT 18  ALKPHOS 91  BILITOT 0.7   Hematology Recent Labs  Lab 09/15/19 2122 09/15/19 2209  09/16/19 0403  WBC 10.6*  --  9.2    RBC 4.60  --  4.69  HGB 13.1 13.9 14.0  HCT 42.4 41.0 42.7  MCV 92.2  --  91.0  MCH 28.5  --  29.9  MCHC 30.9  --  32.8  RDW 15.8*  --  15.8*  PLT 223  --  196   BNP Recent Labs  Lab 09/15/19 2138  BNP 28.6    DDimer No results for input(s): DDIMER in the last 168 hours.   Radiology/Studies:  Dg Chest Portable 1 View  Result Date: 09/15/2019 CLINICAL DATA:  Chest pain and shortness of breath for 1 hour EXAM: PORTABLE CHEST 1 VIEW COMPARISON:  07/20/2019 FINDINGS: Cardiac shadow is stable. Postsurgical changes are seen. Previously seen basilar atelectasis has improved. Postsurgical changes in the cervical spine are noted as well. IMPRESSION: Improved aeration in the bases with mild residual atelectatic changes. Electronically Signed   By: Inez Catalina M.D.   On: 09/15/2019 22:16    Assessment and Plan:   Travis [name] is a 73 y.o. male, primarily wheelchair dependent, with a hx of chronic diastolic HF, HTN, hypothyroidism, obesity, OSA on CPAP, T2DM, COPD on chronic home O2 (2.5L/min baseline), stage III CKD, recurrent UTIs and nonobstructive CAD by cath in 2016, admitted for acute COPD exacerbation, who is being seen today for the evaluation of chest pain, at the request of Dr. Wynelle Cleveland, Internal Medicine.  1. Chest Pain: new onset 30 min episode of anterior CP radiating to mid scapular area, occurring at rest and after consuming a meal that consisted of a burger and potatoes. Resolved spontaneously w/o intervention and no further recurrence. This is also in the setting of suspected mild acute COPD exacerbation. Pt also with complaint of increasing exertional dyspnea, not improved w/ diuresis (10 lb wt loss after recent diuretic increase). Has known coronary disease w/ LHC in 2016 showing mild-moderate nonobstructive CAD. COVID negative.   Differential diagnosis includes CP 2/2 acute COPDE. ?chest tightness from bronchospam.   ? GI etiology given post prandial onset  Acute MI  ruled out w/ normal hs troponins x 2 (18>>18). EKG w/ chronic RBBB ? Unstable angina and progression of his CAD>> LHC in 2016 showed 40% prox-mid RCA lesion, 50% ostial-prox Cx,  40% LM lesion and 40% distal LAD. At risk for disease progression given multiple CRFs including poorly controlled DM, HTN and obesity. He is not a good candidate for cardiac cath given his renal dysfunction, but given that he had evidence of mild-moderate LM disease on cath 6 years ago, may consider NST to r/o ischemia in this territory, once he has recovered from his acute COPDE. Given his body habitus, this would likely need to be a 2 day study.  For now, continue ASA and statin therapy. Will obtain FLP in the AM given no recent studies. LDL goal < 70 mg/dL.    2. Acute COPD Exacerbation: on antibiotics, steroids and inhalers.   Management per IM   3. Chronic Diastolic HF: followed by the [facility] w/ recent titration of outpatient diuretics w/ 10 lb loss and gradual improvement in LEE after increasing metolazone to daily use, + bid torsemide and daily spironolactone.   No edema on CXR  Still with 2+ bilateral LEE on exam, but overall improved  SCr and K stable w/ high dose diuretics  Continue current HF regimen.   4. UTI: h/o recurrent UTIs. Recently diagnosed w/ UTI and PCP ordered 10 day course of  outpatient antibiotics.   On day 5/10 but w/ rigors in the ED. Consider f/u UA and urine cultures. Will defer to primary team  5. Decubitus Ulcer: management per primary   6. Stage III CKD: SCr 2.18 c/w baseline  On high dose diuretic therapy for chronic diastolic HF.  Monitor closely w/ daily BMPs     For questions or updates, please contact [place] Please consult [email or web address] for contact info under     Signed, Lyda Jester, [name]  09/16/2019 11:06 AM

## 2019-09-16 NOTE — ED Notes (Signed)
Pt transported to floor before admin of Insulin. Pt ate 85% of his meal. Sliding scale changed per Dr. Wynelle Cleveland

## 2019-09-16 NOTE — H&P (Signed)
History and Physical   Travis [name] CBJ:[id number] DOB: [birth year] DOA: 09/15/2019  Referring MD/NP/PA: Dr. Sedonia Small  PCP: [name], MD   Outpatient Specialists: Dr. [name]  Patient coming from: Home  Chief Complaint: Shortness of breath  HPI: Travis [name] is a 73 y.o. male with medical history significant of diastolic dysfunction CHF, diabetes insulin-dependent, COPD, cervical myelopathy, obstructive sleep apnea on CPAP, morbid obesity, gait abnormalities, peripheral neuropathy and degenerative disc disease who came to the ER with significant shortness of breath cough especially with exertion.  Patient call his cardiologist earlier in the day and was told to come to the ER.  Symptoms have persisted for the past 2 days.  He has taken his home medications no relief.  Patient was seen in the ER to be slightly in respiratory distress.  Chest x-ray showed no evidence of pulmonary edema.  He is mildly wheezing otherwise not moving much air.  Patient suspected to have respiratory distress from COPD exacerbation rather than CHF exacerbation.  He is being admitted for treatment and evaluation.  ED Course: Temperature is 98.2 blood pressure 138/60 pulse 80 respiratory rate 36 oxygen sat 91% on 2 L.  ABG venous showed pH of 7.438.  Sodium 134 potassium 4.4 chloride 88 CO2 31 glucose 337 BUN 96 creatinine 2.30 with calcium 9.0.  BNP of 28.  Troponin is 18 lactic acid 1.4.  White count 10.6 hemoglobin 13.1 and platelet 223.  Chest x-ray showed improved aeration in the bases.  Mild residual atelectatic changes.  Patient is being admitted with possible COPD exacerbation.  Review of Systems: As per HPI otherwise 10 point review of systems negative.    Past Medical History:  Diagnosis Date  . Cervical myelopathy (HCC)    with myelomalacia at C5-6 and C3-4  . CHF (congestive heart failure) ([place])   . Constipation - functional    controlled with miralax  . COPD (chronic obstructive pulmonary  disease) (HCC)    mild  . DJD (degenerative joint disease)    WHOLE SPINE  . Dyspnea   . Gait disorder   . Heart murmur    years ago   . Hypertension   . Hypothyroidism    Low d/t treatment of hyperthyroidism  . Lumbosacral spinal stenosis 03/13/2014  . On home oxygen therapy    "2.5L all the time" (06/29/2018)  . Peripheral neuropathy   . Pneumonia   . PONV (postoperative nausea and vomiting)   . RBBB   . Renal disorder    acute kidney failure  . Restless legs syndrome 03/14/2015  . RLS (restless legs syndrome)   . Sleep apnea    DX  2009/2010 [facility] done at [facility]  . 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 Type II diabetes mellitus ([place])    diet controlled    Past Surgical History:  Procedure Laterality Date  . BACK SURGERY     6 surgeries  . CARDIAC CATHETERIZATION [name] 09/07/2015   Procedure: Right/Left Heart Cath and Coronary Angiography;  Surgeon: Peter M Martinique, MD;  Location: [facility];  Service: Cardiovascular;  Laterality: [name];  . CHOLECYSTECTOMY    . EYE SURGERY     BIL CATARACTS  . LUMBAR LAMINECTOMY/DECOMPRESSION MICRODISCECTOMY  07/14/2012   Procedure: LUMBAR LAMINECTOMY/DECOMPRESSION MICRODISCECTOMY 2 LEVELS;  Surgeon: Eustace Moore, MD;  Location: [facility];  Service: Neurosurgery;  Laterality: Left;  Lumbar two three laminectomy, left thoracic four five transpedicular diskectomy  . POSTERIOR CERVICAL FUSION/FORAMINOTOMY  04/08/2018   Procedure: Laminectomy and Foraminotomy - Lumbar Four-Lumbar Five, instrumented posterolateral fusion Lumbar Four- Five, Laminectomy and Foraminotomy - Thoracic One-Thoracic Two;  Surgeon: Eustace Moore, MD;  Location: [facility] OR;  Service: Neurosurgery;;  posterior  . POSTERIOR LUMBAR FUSION  04/08/2018   Laminectomy and Foraminotomy - Lumbar Four-Lumbar Five, instrumented posterolateral fusion Lumbar Four- Five, Laminectomy and Foraminotomy - Thoracic One-Thoracic Two  . POSTERIOR LUMBAR FUSION  04/08/2018   Laminectomy and  Foraminotomy - Lumbar Four-Lumbar Five, instrumented posterolateral fusion Lumbar Four- Five, Laminectomy and Foraminotomy - Thoracic One-Thoracic Two  . RIGHT HEART CATH [name] 07/18/2019   Procedure: RIGHT HEART CATH;  Surgeon: Jolaine Artist, MD;  Location: [facility];  Service: Cardiovascular;  Laterality: [name];     reports that he quit smoking about 34 years ago. His smoking use included cigarettes. He has a 40.00 pack-year smoking history. He quit smokeless tobacco use about 34 years ago. He reports that he does not drink alcohol or use drugs.  Allergies  Allergen Reactions  . Amlodipine Swelling and Other (See Comments)    Causes edema (per [facility] paperwork)  . Plendil [Felodipine] Swelling and Other (See Comments)    Causes edema (per [facility] paperwork)  . Compazine [Prochlorperazine] Other (See Comments)    Caused agitation (per [facility] paperwork)  . Cymbalta [Duloxetine Hcl] Other (See Comments)    Made the patient "feel weird," per [facility] paperwork  . Jardiance [Empagliflozin] Other (See Comments)    Stopped by MD because of unwanted side effects  . Other Nausea Only and Other (See Comments)    ANESTHESIA UNSPECIFIED- makes sick, must have anti-nausea medication in advance  . Levofloxacin In D5w Itching    Possible infusion reaction 03/29/17. Tolerating oral Levaquin 03/30/17  . Septra [Sulfamethoxazole-Trimethoprim] Nausea Only and Other (See Comments)    Per [facility] paperwork    Family History  Problem Relation Age of Onset  . Polycythemia Mother   . Diabetes Father   . Hypertension Father   . Heart disease Father   . CAD Other        1 of his 4 brothers and father has CAD     Prior to Admission medications   Medication Sig Start Date End Date Taking? Authorizing Provider  aspirin EC 81 MG tablet Take 81 mg by mouth daily.   Yes [provider]  atorvastatin (LIPITOR) 20 MG tablet Take 20 mg by mouth  every evening.    Yes [provider]  cephALEXin (KEFLEX) 500 MG capsule Take 500 mg by mouth every 8 (eight) hours. FOR 10 DAYS 09/08/19 09/18/19 Yes [provider]  escitalopram (LEXAPRO) 20 MG tablet Take 20 mg by mouth daily.   Yes [provider]  gabapentin (NEURONTIN) 400 MG capsule Take 400 mg by mouth 3 (three) times daily.   Yes [provider]  hydrALAZINE (APRESOLINE) 25 MG tablet Take 1.5 tablets (37.5 mg total) by mouth 3 (three) times daily. 08/22/19  Yes Clegg, Amy D, NP  insulin aspart (NOVOLOG FLEXPEN) 100 UNIT/ML FlexPen Inject 18 Units into the skin 3 (three) times daily with meals. 03/07/19  Yes Shamleffer, Melanie Crazier, MD  Insulin Glargine (LANTUS SOLOSTAR) 100 UNIT/ML Solostar Pen Inject 60 Units into the skin at bedtime. 03/04/19  Yes Shamleffer, Melanie Crazier, MD  levothyroxine (SYNTHROID, LEVOTHROID) 175 MCG tablet Take 1 tablet (175 mcg total) by mouth daily. 05/12/18  Yes Angiulli, Lavon Paganini, [name]  metolazone (ZAROXOLYN) 2.5  MG tablet Take 1 tablet (2.5 mg total) by mouth daily. 08/31/19  Yes Lyda Jester M, [name]  multivitamin (ONE-A-DAY MEN'S) TABS tablet Take 1 tablet by mouth daily.    Yes [provider]  OXYGEN Inhale 2-3 L/min into the lungs continuous.    Yes [provider]  polyethylene glycol (MIRALAX / GLYCOLAX) packet Take 17 g by mouth daily as needed. For constipation Patient taking differently: Take 17 g by mouth at bedtime.  08/09/14  Yes Love, Ivan Anchors, [name]  potassium chloride SA (K-DUR) 20 MEQ tablet Take 1 tablet (20 mEq total) by mouth 2 (two) times daily. Take 1 tab only on days you take Metolazone Patient taking differently: Take 20 mEq by mouth 3 (three) times daily.  08/22/19  Yes Clegg, Amy D, NP  pramipexole (MIRAPEX) 0.5 MG tablet One tablet in the morning and 2 in the evening Patient taking differently: Take 0.5-1 mg by mouth See admin instructions. Take 0.5 mg by mouth in the morning and 1  mg at bedtime 11/24/18  Yes Kathrynn Ducking, MD  spironolactone (ALDACTONE) 25 MG tablet Take 1 tablet (25 mg total) by mouth daily. 10/29/18  Yes Nahser, Wonda Cheng, MD  tamsulosin (FLOMAX) 0.4 MG CAPS capsule Take 1 capsule (0.4 mg total) by mouth daily. Patient taking differently: Take 0.4 mg by mouth at bedtime.  05/12/18  Yes Angiulli, Lavon Paganini, [name]  torsemide (DEMADEX) 100 MG tablet Take 1 tablet (100 mg total) by mouth 2 (two) times daily. 08/04/19  Yes Clegg, Amy D, NP  TOVIAZ 8 MG TB24 tablet Take 1 tablet (8 mg total) by mouth daily. Patient taking differently: Take 8 mg by mouth at bedtime.  05/12/18  Yes Angiulli, Lavon Paganini, [name]  Blood Glucose Monitoring Suppl (ONETOUCH VERIO) w/Device KIT 1 kit by Does not apply route 4 (four) times daily. Use as directed to test blood sugars 4 times daily DX E11.42 03/04/19   Shamleffer, Melanie Crazier, MD  Continuous Blood Gluc Receiver (FREESTYLE LIBRE 14 DAY READER) DEVI 1 kit by Does not apply route 3 (three) times daily. Patient not taking: Reported on 09/15/2019 02/07/19   Shamleffer, Melanie Crazier, MD  Continuous Blood Gluc Sensor (FREESTYLE LIBRE 14 DAY SENSOR) MISC 1 kit by Does not apply route 3 (three) times daily. Patient not taking: Reported on 09/15/2019 02/07/19   Shamleffer, Melanie Crazier, MD  glucose blood test strip Use as instructed to test blood sugars 4 times daily DX E11.42 03/04/19   Shamleffer, Melanie Crazier, MD  Insulin Pen Needle (BD PEN NEEDLE MICRO U/F) 32G X 6 MM MISC Four times daily 02/04/19   Shamleffer, Melanie Crazier, MD  Insulin Pen Needle (BD PEN NEEDLE MICRO U/F) 32G X 6 MM MISC 4 times a day 03/04/19   Shamleffer, Melanie Crazier, MD  Lancets [facility] ULTRASOFT) lancets Use as instructed to test blood sugars 4 times daily DX E11.42 03/04/19   Shamleffer, Melanie Crazier, MD    Physical Exam: Vitals:   09/15/19 2215 09/15/19 2230 09/15/19 2321 09/15/19 2345  BP: 138/60 126/65 136/66 124/63  Pulse: 78 77 79 77  Resp:  (!) 22 (!) 23 (!) 26 (!) 23  SpO2: 94% 94% 91% 93%  Weight:      Height:          Constitutional: Morbidly obese man in mild respiratory distress Vitals:   09/15/19 2215 09/15/19 2230 09/15/19 2321 09/15/19 2345  BP: 138/60 126/65 136/66 124/63  Pulse: 78 77 79 77  Resp: [name])  22 (!) 23 (!) 26 (!) 23  SpO2: 94% 94% 91% 93%  Weight:      Height:       Eyes: PERRL, lids and conjunctivae normal ENMT: Mucous membranes are moist. Posterior pharynx clear of any exudate or lesions.Normal dentition.  Neck: normal, supple, no masses, no thyromegaly Respiratory: Decreased air entry bilaterally with some mild expiratory wheezing no crackles. Normal respiratory effort. No accessory muscle use.  Cardiovascular: Regular rate and rhythm, no murmurs / rubs / gallops. No extremity edema. 2+ pedal pulses. No carotid bruits.  Abdomen: no tenderness, no masses palpated. No hepatosplenomegaly. Bowel sounds positive.  Musculoskeletal: no clubbing / cyanosis. No joint deformity upper and lower extremities. Good ROM, no contractures. Normal muscle tone.  Skin: no rashes, lesions, ulcers. No induration Neurologic: CN 2-12 grossly intact. Sensation intact, DTR normal. Strength 5/5 in all 4.  Psychiatric: Normal judgment and insight. Alert and oriented x 3. Normal mood.     Labs on Admission: I have personally reviewed following labs and imaging studies  CBC: Recent Labs  Lab 09/15/19 2122 09/15/19 2209  WBC 10.6*  --   HGB 13.1 13.9  HCT 42.4 41.0  MCV 92.2  --   PLT 223  --    Basic Metabolic Panel: Recent Labs  Lab 09/15/19 2122 09/15/19 2209  NA 134* 133*  K 4.4 4.1  CL 88*  --   CO2 31  --   GLUCOSE 337*  --   BUN 96*  --   CREATININE 2.30*  --   CALCIUM 9.0  --    GFR: Estimated Creatinine Clearance: 39.7 mL/min (A) (by C-G formula based on SCr of 2.3 mg/dL (H)). Liver Function Tests: No results for input(s): AST, ALT, ALKPHOS, BILITOT, PROT, ALBUMIN in the last 168 hours. No  results for input(s): LIPASE, AMYLASE in the last 168 hours. No results for input(s): AMMONIA in the last 168 hours. Coagulation Profile: No results for input(s): INR, PROTIME in the last 168 hours. Cardiac Enzymes: No results for input(s): CKTOTAL, CKMB, CKMBINDEX, TROPONINI in the last 168 hours. BNP (last 3 results) No results for input(s): PROBNP in the last 8760 hours. HbA1C: No results for input(s): HGBA1C in the last 72 hours. CBG: No results for input(s): GLUCAP in the last 168 hours. Lipid Profile: No results for input(s): CHOL, HDL, LDLCALC, TRIG, CHOLHDL, LDLDIRECT in the last 72 hours. Thyroid Function Tests: No results for input(s): TSH, T4TOTAL, FREET4, T3FREE, THYROIDAB in the last 72 hours. Anemia Panel: No results for input(s): VITAMINB12, FOLATE, FERRITIN, TIBC, IRON, RETICCTPCT in the last 72 hours. Urine analysis:    Component Value Date/Time   COLORURINE YELLOW 07/20/2019 1728   APPEARANCEUR CLEAR 07/20/2019 1728   LABSPEC 1.020 08/06/2019 1739   PHURINE 6.5 08/06/2019 1739   GLUCOSEU NEGATIVE 08/06/2019 1739   HGBUR NEGATIVE 08/06/2019 1739   BILIRUBINUR NEGATIVE 08/06/2019 1739   KETONESUR NEGATIVE 08/06/2019 1739   PROTEINUR NEGATIVE 08/06/2019 1739   UROBILINOGEN 0.2 08/06/2019 1739   NITRITE POSITIVE (A) 08/06/2019 1739   LEUKOCYTESUR SMALL (A) 08/06/2019 1739   Sepsis Labs: _0 (procalcitonin:4,lacticidven:4) ) Recent Results (from the past 240 hour(s))  SARS Coronavirus 2 [facility] order, Performed in [facility] hospital lab) Nasopharyngeal Nasopharyngeal Swab     Status: None   Collection Time: 09/15/19  9:57 PM   Specimen: Nasopharyngeal Swab  Result Value Ref Range Status   SARS Coronavirus 2 NEGATIVE NEGATIVE Final    Comment: (NOTE) If result is NEGATIVE SARS-CoV-2 target nucleic  acids are NOT DETECTED. The SARS-CoV-2 RNA is generally detectable in upper and lower  respiratory specimens during the acute phase of infection. The  lowest  concentration of SARS-CoV-2 viral copies this assay can detect is 250  copies / mL. A negative result does not preclude SARS-CoV-2 infection  and should not be used as the sole basis for treatment or other  patient management decisions.  A negative result may occur with  improper specimen collection / handling, submission of specimen other  than nasopharyngeal swab, presence of viral mutation(s) within the  areas targeted by this assay, and inadequate number of viral copies  (<250 copies / mL). A negative result must be combined with clinical  observations, patient history, and epidemiological information. If result is POSITIVE SARS-CoV-2 target nucleic acids are DETECTED. The SARS-CoV-2 RNA is generally detectable in upper and lower  respiratory specimens dur ing the acute phase of infection.  Positive  results are indicative of active infection with SARS-CoV-2.  Clinical  correlation with patient history and other diagnostic information is  necessary to determine patient infection status.  Positive results do  not rule out bacterial infection or co-infection with other viruses. If result is PRESUMPTIVE POSTIVE SARS-CoV-2 nucleic acids MAY BE PRESENT.   A presumptive positive result was obtained on the submitted specimen  and confirmed on repeat testing.  While 2019 novel coronavirus  (SARS-CoV-2) nucleic acids may be present in the submitted sample  additional confirmatory testing may be necessary for epidemiological  and / or clinical management purposes  to differentiate between  SARS-CoV-2 and other Sarbecovirus currently known to infect humans.  If clinically indicated additional testing with an alternate test  methodology [phone]) is advised. The SARS-CoV-2 RNA is generally  detectable in upper and lower respiratory sp ecimens during the acute  phase of infection. The expected result is Negative. Fact Sheet for Patients:  StrictlyIdeas.no  Fact Sheet for Healthcare Providers: BankingDealers.co.za This test is not yet approved or cleared by the [place] FDA and has been authorized for detection and/or diagnosis of SARS-CoV-2 by FDA under an Emergency Use Authorization (EUA).  This EUA will remain in effect (meaning this test can be used) for the duration of the COVID-19 declaration under Section 564(b)(1) of the Act, 21 U.S.C. section 360bbb-3(b)(1), unless the authorization is terminated or revoked sooner. Performed at [facility], [place] [address] [place], [place] [address]      Radiological Exams on Admission: Dg Chest Portable 1 View  Result Date: 09/15/2019 CLINICAL DATA:  Chest pain and shortness of breath for 1 hour EXAM: PORTABLE CHEST 1 VIEW COMPARISON:  07/20/2019 FINDINGS: Cardiac shadow is stable. Postsurgical changes are seen. Previously seen basilar atelectasis has improved. Postsurgical changes in the cervical spine are noted as well. IMPRESSION: Improved aeration in the bases with mild residual atelectatic changes. Electronically Signed   By: Inez Catalina M.D.   On: 09/15/2019 22:16    EKG: Independently reviewed.  It shows sinus rhythm with a rate of 82.  Right bundle branch block  Assessment/Plan Active Problems:   Polyneuropathy in other diseases classified elsewhere (HCC)   Abnormality of gait   OSA on CPAP   Stage 3 chronic kidney disease (HCC)   Obesity, Class III, BMI 40-49.9 (morbid obesity) ([place])   Type 2 diabetes mellitus with hyperglycemia, with long-term current use of insulin (HCC)   COPD (chronic obstructive pulmonary disease) (HCC)   Acute respiratory distress     #1 acute respiratory distress: Appears  to be due to COPD exacerbation.  Other differential could be pulmonary embolism but patient's BUN/creatinine is elevated cannot get a CT angiogram.  We may consider VQ scan if no improvement.  In the meantime initiate IV steroids nebulizers with  antibiotics.  #2 history of COPD with exacerbation: Continue as above  #3 obstructive sleep apnea: Continue CPAP  #4 history of CHF: No evidence of fluid overload at this point.  Continue home dose of torsemide  #5 diabetes: Insulin-dependent.  Continue home regimen and sliding scale insulin  #6 chronic kidney disease stage III: Appears to be at baseline.  Continue monitoring creatinine  #7 morbid obesity: Dietary counseling.   DVT prophylaxis: Heparin Code Status: Full code Family Communication: Wife Disposition Plan: To be determined Consults called: None Admission status: Inpatient  Severity of Illness: The appropriate patient status for this patient is INPATIENT. Inpatient status is judged to be reasonable and necessary in order to provide the required intensity of service to ensure the patient's safety. The patient's presenting symptoms, physical exam findings, and initial radiographic and laboratory data in the context of their chronic comorbidities is felt to place them at high risk for further clinical deterioration. Furthermore, it is not anticipated that the patient will be medically stable for discharge from the hospital within 2 midnights of admission. The following factors support the patient status of inpatient.   " The patient's presenting symptoms include shortness of breath. " The worrisome physical exam findings include mild expiratory wheeze. " The initial radiographic and laboratory data are worrisome because of no abnormal findings on x-ray. " The chronic co-morbidities include COPD and CHF.   * I certify that at the point of admission it is my clinical judgment that the patient will require inpatient hospital care spanning beyond 2 midnights from the point of admission due to high intensity of service, high risk for further deterioration and high frequency of surveillance required.Barbette Merino MD [facility] Pager [phone]  If 7PM-7AM,  please contact night-coverage [email or web address] Password [facility]  09/16/2019, 12:42 AM

## 2019-09-16 NOTE — ED Notes (Signed)
Lunch Tray Ordered @ 1119. 

## 2019-09-16 NOTE — Progress Notes (Signed)
PROGRESS NOTE    Travis [name]   [id number]  DOB: 11-17-1946  DOA: 09/15/2019 PCP: [name], MD   Brief Narrative:  Travis [name] is a 73 year old man with chronic respiratory failure on 2.5 L of oxygen, COPD, OSA on BiPAP, diastolic heart failure, type 2 diabetes mellitus, morbid obesity and constipation. The patient is mostly wheelchair-bound. He is sent to the hospital by his family after he complained of chest pain.  His family called his cardiologist and the on-call cardiologist recommended he come to the ED for a chest pain work-up.  In the ED he was noted to be dyspneic and he admitted to increasing dyspnea over the past couple of days.  He was diagnosed with a COPD exacerbation, started on steroids and nebulizer treatments and also a BiPAP.  He did not like the hospital BiPAP and therefore refused to wear it.   Subjective: This morning when I evaluated him he stated he felt better but on exam I could tell that he was still short of breath with abdominal breathing.  He was convinced to use his home BiPAP. I also spoke with his daughter-in-law who mentioned that he is never had chest pain before and due to the pain he had yesterday she would like a cardiology work-up.    Assessment & Plan:   Principal Problem:   Chest pain-  possible angina -3 sets of troponin are negative and EKG is unrevealing - Cardiology has evaluated him and is deliberating on how to proceed  Active Problems: Acute on chronic respiratory failure- likely COPD exacerbation in addition to OSA and OHS -COVID negative - He does need to wear his BiPAP which we have convinced him to do -We will leave this on for 3 to 4 hours and see if his respiratory failure resolves  Chronic diastolic heart failure/hypertension -Normal BNP, chest x-ray does not reveal pulmonary edema - Home medications are being continued- hydralazine, metolazone, Demadex, Spironolactone -He did receive a dose of Lasix 160 mg  yesterday and says his legs are less swollen today -Keep legs elevated  Type 2 diabetes mellitus, uncontrolled -Due to IV steroids his sugars are in the 300s and we have had to increase his insulin dosage-monitor sugars closely -A1c is pending - last A1c on 05/04/2019 was 10.1  Diabetic neuropathy - Continue gabapentin    Abnormality of gait -Mostly wheelchair-bound  Chronic kidney disease stage III-IV GFR today is 29-continue to follow  Morbid obesity Body mass index is 37.6 kg/m.   Time spent in minutes: 35 DVT prophylaxis: Heparin Code Status: Full code Family Communication: Daughter in Engineer, maintenance Disposition Plan: Follow on telemetry Consultants:   Cardiology Procedures:   None Antimicrobials:  Anti-infectives (From admission, onward)   Start     Dose/Rate Route Frequency Ordered Stop   09/16/19 0100  cefTRIAXone (ROCEPHIN) 1 g in sodium chloride 0.9 % 100 mL IVPB     1 g 200 mL/hr over 30 Minutes Intravenous Every 24 hours 09/16/19 0059         Objective: Vitals:   09/16/19 1150 09/16/19 1200 09/16/19 1233 09/16/19 1342  BP:  128/73  138/68  Pulse:  80  90  Resp:  18  (!) 22  Temp: (!) 97.3 F (36.3 C)  (!) 97.4 F (36.3 C)   TempSrc: Axillary  Axillary   SpO2:  95%  95%  Weight:      Height:        Intake/Output Summary (Last 24 hours) at  09/16/2019 1346 Last data filed at 09/16/2019 0555 Gross per 24 hour  Intake -  Output 820 ml  Net -820 ml   Filed Weights   09/15/19 2116  Weight: 129.3 kg    Examination: General exam: Appears comfortable  HEENT: PERRLA, oral mucosa moist, no sclera icterus or thrush Respiratory system: Respiratory rate in high 20s low 30s with abdominal- breathing notable for respiratory distress Cardiovascular system: S1 & S2 heard, RRR.   Gastrointestinal system: Abdomen soft, non-tender, nondistended. Normal bowel sounds. Central nervous system: Alert and oriented. No focal neurological deficits. Extremities: No  cyanosis, clubbing -2+ pitting edema Skin: No rashes or ulcers Psychiatry:  Mood & affect appropriate.     Data Reviewed: I have personally reviewed following labs and imaging studies  CBC: Recent Labs  Lab 09/15/19 2122 09/15/19 2209 09/16/19 0403  WBC 10.6*  --  9.2  HGB 13.1 13.9 14.0  HCT 42.4 41.0 42.7  MCV 92.2  --  91.0  PLT 223  --  123456   Basic Metabolic Panel: Recent Labs  Lab 09/15/19 2122 09/15/19 2209 09/16/19 0403  NA 134* 133* 137  K 4.4 4.1 4.1  CL 88*  --  89*  CO2 31  --  31  GLUCOSE 337*  --  243*  BUN 96*  --  92*  CREATININE 2.30*  --  2.18*  CALCIUM 9.0  --  9.3   GFR: Estimated Creatinine Clearance: 41.9 mL/min (A) (by C-G formula based on SCr of 2.18 mg/dL (H)). Liver Function Tests: Recent Labs  Lab 09/16/19 0403  AST 17  ALT 18  ALKPHOS 91  BILITOT 0.7  PROT 8.0  ALBUMIN 3.6   No results for input(s): LIPASE, AMYLASE in the last 168 hours. No results for input(s): AMMONIA in the last 168 hours. Coagulation Profile: No results for input(s): INR, PROTIME in the last 168 hours. Cardiac Enzymes: No results for input(s): CKTOTAL, CKMB, CKMBINDEX, TROPONINI in the last 168 hours. BNP (last 3 results) No results for input(s): PROBNP in the last 8760 hours. HbA1C: No results for input(s): HGBA1C in the last 72 hours. CBG: Recent Labs  Lab 09/16/19 0144 09/16/19 0759 09/16/19 1323  GLUCAP 267* 306* 359*   Lipid Profile: No results for input(s): CHOL, HDL, LDLCALC, TRIG, CHOLHDL, LDLDIRECT in the last 72 hours. Thyroid Function Tests: Recent Labs    09/16/19 0403  TSH 2.222   Anemia Panel: No results for input(s): VITAMINB12, FOLATE, FERRITIN, TIBC, IRON, RETICCTPCT in the last 72 hours. Urine analysis:    Component Value Date/Time   COLORURINE YELLOW 07/20/2019 1728   APPEARANCEUR CLEAR 07/20/2019 1728   LABSPEC 1.020 08/06/2019 1739   PHURINE 6.5 08/06/2019 1739   GLUCOSEU NEGATIVE 08/06/2019 1739   HGBUR NEGATIVE  08/06/2019 1739   BILIRUBINUR NEGATIVE 08/06/2019 1739   KETONESUR NEGATIVE 08/06/2019 1739   PROTEINUR NEGATIVE 08/06/2019 1739   UROBILINOGEN 0.2 08/06/2019 1739   NITRITE POSITIVE (A) 08/06/2019 1739   LEUKOCYTESUR SMALL (A) 08/06/2019 1739   Sepsis Labs: @LABRCNTIP (procalcitonin:4,lacticidven:4) ) Recent Results (from the past 240 hour(s))  SARS Coronavirus 2 [facility] order, Performed in [facility] hospital lab) Nasopharyngeal Nasopharyngeal Swab     Status: None   Collection Time: 09/15/19  9:57 PM   Specimen: Nasopharyngeal Swab  Result Value Ref Range Status   SARS Coronavirus 2 NEGATIVE NEGATIVE Final    Comment: (NOTE) If result is NEGATIVE SARS-CoV-2 target nucleic acids are NOT DETECTED. The SARS-CoV-2 RNA is generally detectable in upper and  lower  respiratory specimens during the acute phase of infection. The lowest  concentration of SARS-CoV-2 viral copies this assay can detect is 250  copies / mL. A negative result does not preclude SARS-CoV-2 infection  and should not be used as the sole basis for treatment or other  patient management decisions.  A negative result may occur with  improper specimen collection / handling, submission of specimen other  than nasopharyngeal swab, presence of viral mutation(s) within the  areas targeted by this assay, and inadequate number of viral copies  (<250 copies / mL). A negative result must be combined with clinical  observations, patient history, and epidemiological information. If result is POSITIVE SARS-CoV-2 target nucleic acids are DETECTED. The SARS-CoV-2 RNA is generally detectable in upper and lower  respiratory specimens dur ing the acute phase of infection.  Positive  results are indicative of active infection with SARS-CoV-2.  Clinical  correlation with patient history and other diagnostic information is  necessary to determine patient infection status.  Positive results do  not rule out bacterial infection or  co-infection with other viruses. If result is PRESUMPTIVE POSTIVE SARS-CoV-2 nucleic acids MAY BE PRESENT.   A presumptive positive result was obtained on the submitted specimen  and confirmed on repeat testing.  While 2019 novel coronavirus  (SARS-CoV-2) nucleic acids may be present in the submitted sample  additional confirmatory testing may be necessary for epidemiological  and / or clinical management purposes  to differentiate between  SARS-CoV-2 and other Sarbecovirus currently known to infect humans.  If clinically indicated additional testing with an alternate test  methodology [phone]) is advised. The SARS-CoV-2 RNA is generally  detectable in upper and lower respiratory sp ecimens during the acute  phase of infection. The expected result is Negative. Fact Sheet for Patients:  StrictlyIdeas.no Fact Sheet for Healthcare Providers: BankingDealers.co.za This test is not yet approved or cleared by the [place] FDA and has been authorized for detection and/or diagnosis of SARS-CoV-2 by FDA under an Emergency Use Authorization (EUA).  This EUA will remain in effect (meaning this test can be used) for the duration of the COVID-19 declaration under Section 564(b)(1) of the Act, 21 U.S.C. section 360bbb-3(b)(1), unless the authorization is terminated or revoked sooner. Performed at [facility], [place] [address] [place], [place] [address]          Radiology Studies: Dg Chest Portable 1 View  Result Date: 09/15/2019 CLINICAL DATA:  Chest pain and shortness of breath for 1 hour EXAM: PORTABLE CHEST 1 VIEW COMPARISON:  07/20/2019 FINDINGS: Cardiac shadow is stable. Postsurgical changes are seen. Previously seen basilar atelectasis has improved. Postsurgical changes in the cervical spine are noted as well. IMPRESSION: Improved aeration in the bases with mild residual atelectatic changes. Electronically Signed   By: Inez Catalina  M.D.   On: 09/15/2019 22:16      Scheduled Meds: . aspirin EC  81 mg Oral Daily  . atorvastatin  20 mg Oral QPM  . escitalopram  20 mg Oral Daily  . fesoterodine  8 mg Oral QHS  . gabapentin  400 mg Oral TID  . heparin  5,000 Units Subcutaneous Q8H  . hydrALAZINE  37.5 mg Oral TID  . insulin aspart  0-20 Units Subcutaneous TID WC  . insulin aspart  0-5 Units Subcutaneous QHS  . insulin aspart  18 Units Subcutaneous TID WC  . insulin glargine  60 Units Subcutaneous QHS  . ipratropium-albuterol  3 mL Nebulization Q6H  .  levothyroxine  175 mcg Oral Daily  . methylPREDNISolone (SOLU-MEDROL) injection  60 mg Intravenous Q12H  . metolazone  2.5 mg Oral Daily  . multivitamin with minerals  1 tablet Oral Daily  . polyethylene glycol  17 g Oral QHS  . potassium chloride SA  20 mEq Oral TID  . pramipexole  0.5 mg Oral Daily  . pramipexole  1 mg Oral QHS  . spironolactone  25 mg Oral Daily  . tamsulosin  0.4 mg Oral QHS  . torsemide  100 mg Oral BID   Continuous Infusions: . cefTRIAXone (ROCEPHIN)  IV Stopped (09/16/19 0236)     LOS: 0 days      Debbe Odea, MD [facility] Pager: [email or web address] Password [email or web address] 09/16/2019, 1:46 PM

## 2019-09-16 NOTE — ED Notes (Signed)
Lunch tray believed to bedside

## 2019-09-16 NOTE — Plan of Care (Signed)
  Problem: Education: Goal: Knowledge of General Education information will improve Description Including pain rating scale, medication(s)/side effects and non-pharmacologic comfort measures Outcome: Progressing   Problem: Health Behavior/Discharge Planning: Goal: Ability to manage health-related needs will improve Outcome: Progressing   

## 2019-09-16 NOTE — ED Notes (Signed)
ED TO INPATIENT HANDOFF REPORT  ED Nurse Name and Phone #: [phone]  S Name/Age/Gender Travis [name] 73 y.o. male Room/Bed: 037C/037C  Code Status   Code Status: Full Code  Home/SNF/Other Home Patient oriented to: self Is this baseline? Yes   Triage Complete: Triage complete  Chief Complaint sob  Triage Note Pt. BIB [facility] c/o sudden SOB and CP 1 hour ago while sitting on couch. CP resolved. Pt. Presents with bilateral +4 edema. Pt on home lasix HX CHF on BiPap at home usually oxygen 2-3L. Additional HX COPD  Pt took 325 aspirin at home.  EMS VS SpO2 95% 4L  HR 85 CBG  420 BP 128/78   Allergies Allergies  Allergen Reactions  . Amlodipine Swelling and Other (See Comments)    Causes edema (per [facility] paperwork)  . Plendil [Felodipine] Swelling and Other (See Comments)    Causes edema (per [facility] paperwork)  . Compazine [Prochlorperazine] Other (See Comments)    Caused agitation (per [facility] paperwork)  . Cymbalta [Duloxetine Hcl] Other (See Comments)    Made the patient "feel weird," per [facility] paperwork  . Jardiance [Empagliflozin] Other (See Comments)    Stopped by MD because of unwanted side effects  . Other Nausea Only and Other (See Comments)    ANESTHESIA UNSPECIFIED- makes sick, must have anti-nausea medication in advance  . Levofloxacin In D5w Itching    Possible infusion reaction 03/29/17. Tolerating oral Levaquin 03/30/17  . Septra [Sulfamethoxazole-Trimethoprim] Nausea Only and Other (See Comments)    Per [facility] paperwork    Level of Care/Admitting Diagnosis ED Disposition    ED Disposition Condition [facility] Area: [place] [[id number]]  Level of Care: Telemetry Medical [104]  Covid Evaluation: Asymptomatic Screening Protocol (No Symptoms)  Diagnosis: Acute respiratory distress [id number]  Admitting Physician: Elwyn Reach [[id number]]  Attending Physician: Elwyn Reach [[id number]]  Estimated length of stay: past midnight tomorrow  Certification:: I certify this patient will need inpatient services for at least 2 midnights  PT Class (Do Not Modify): Inpatient [101]  PT Acc Code (Do Not Modify): Private [1]       B Medical/Surgery History Past Medical History:  Diagnosis Date  . Cervical myelopathy (HCC)    with myelomalacia at C5-6 and C3-4  . CHF (congestive heart failure) ([place])   . Constipation - functional    controlled with miralax  . COPD (chronic obstructive pulmonary disease) (HCC)    mild  . DJD (degenerative joint disease)    WHOLE SPINE  . Dyspnea   . Gait disorder   . Heart murmur    years ago   . Hypertension   . Hypothyroidism    Low d/t treatment of hyperthyroidism  . Lumbosacral spinal stenosis 03/13/2014  . On home oxygen therapy    "2.5L all the time" (06/29/2018)  . Peripheral neuropathy   . Pneumonia   . PONV (postoperative nausea and vomiting)   . RBBB   . Renal disorder    acute kidney failure  . Restless legs syndrome 03/14/2015  . RLS (restless legs syndrome)   . Sleep apnea    DX  2009/2010 study done at [facility]  . 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 Type II diabetes mellitus ([place])    diet controlled   Past Surgical History:  Procedure Laterality Date  . BACK SURGERY     6 surgeries  . CARDIAC  CATHETERIZATION [name] 09/07/2015   Procedure: Right/Left Heart Cath and Coronary Angiography;  Surgeon: Peter M Martinique, MD;  Location: [facility];  Service: Cardiovascular;  Laterality: [name];  . CHOLECYSTECTOMY    . EYE SURGERY     BIL CATARACTS  . LUMBAR LAMINECTOMY/DECOMPRESSION MICRODISCECTOMY  07/14/2012   Procedure: LUMBAR LAMINECTOMY/DECOMPRESSION MICRODISCECTOMY 2 LEVELS;  Surgeon: Eustace Moore, MD;  Location: [place] [facility];  Service: Neurosurgery;  Laterality: Left;  Lumbar two three laminectomy, left thoracic four five transpedicular diskectomy  . POSTERIOR CERVICAL FUSION/FORAMINOTOMY  04/08/2018    Procedure: Laminectomy and Foraminotomy - Lumbar Four-Lumbar Five, instrumented posterolateral fusion Lumbar Four- Five, Laminectomy and Foraminotomy - Thoracic One-Thoracic Two;  Surgeon: Eustace Moore, MD;  Location[facility];  Service: Neurosurgery;;  posterior  . POSTERIOR LUMBAR FUSION  04/08/2018   Laminectomy and Foraminotomy - Lumbar Four-Lumbar Five, instrumented posterolateral fusion Lumbar Four- Five, Laminectomy and Foraminotomy - Thoracic One-Thoracic Two  . POSTERIOR LUMBAR FUSION  04/08/2018   Laminectomy and Foraminotomy - Lumbar Four-Lumbar Five, instrumented posterolateral fusion Lumbar Four- Five, Laminectomy and Foraminotomy - Thoracic One-Thoracic Two  . RIGHT HEART CATH [name] 07/18/2019   Procedure: RIGHT HEART CATH;  Surgeon: Jolaine Artist, MD;  Location: [place] [facility];  Service: Cardiovascular;  Laterality: [name];     A IV Location/Drains/Wounds Patient Lines/Drains/Airways Status   Active Line/Drains/Airways    Name:   Placement date:   Placement time:   Site:   Days:   Peripheral IV 09/15/19 Right Antecubital   09/15/19    2110    Antecubital   1   External Urinary Catheter   09/16/19    1150    -   less than 1   Incision (Closed) 04/08/18 Back Other (Comment)   04/08/18    1128     526   Incision (Closed) 04/08/18 Back   04/08/18    1308     526   Pressure Ulcer 07/27/14 Stage II -  Partial thickness loss of dermis presenting as a shallow open ulcer with a red, pink wound bed without slough.   07/27/14    1000     1877   Pressure Injury 07/02/18   07/02/18    1900     441   Pressure Injury 11/21/18 Stage III -  Full thickness tissue loss. Subcutaneous fat may be visible but bone, tendon or muscle are NOT exposed. Full thicknes small open area to sacrum.   11/21/18    0005     299   Pressure Injury 05/01/19 Stage II -  Partial thickness loss of dermis presenting as a shallow open ulcer with a red, pink wound bed without slough.   05/01/19    0615     138   Wound  / Incision (Open or Dehisced) 06/28/18 Other (Comment) Other (Comment)   06/28/18    [id number]    Other (Comment)   445          Intake/Output Last 24 hours  Intake/Output Summary (Last 24 hours) at 09/16/2019 1236 Last data filed at 09/16/2019 0555 Gross per 24 hour  Intake -  Output 820 ml  Net -820 ml    Labs/Imaging Results for orders placed or performed during the hospital encounter of 09/15/19 (from the past 48 hour(s))  Basic metabolic panel     Status: Abnormal   Collection Time: 09/15/19  9:22 PM  Result Value Ref Range   Sodium 134 (L) 135 -  145 mmol/L   Potassium 4.4 3.5 - 5.1 mmol/L   Chloride 88 (L) 98 - 111 mmol/L   CO2 31 22 - 32 mmol/L   Glucose, Bld 337 (H) 70 - 99 mg/dL   BUN 96 (H) 8 - 23 mg/dL   Creatinine, Ser 2.30 (H) 0.61 - 1.24 mg/dL   Calcium 9.0 8.9 - 10.3 mg/dL   GFR calc non Af Amer 27 (L) >60 mL/min   GFR calc Af Amer 31 (L) >60 mL/min   Anion gap 15 5 - 15    Comment: Performed at [facility] [address] [place], [place] [address]  CBC     Status: Abnormal   Collection Time: 09/15/19  9:22 PM  Result Value Ref Range   WBC 10.6 (H) 4.0 - 10.5 K/uL   RBC 4.60 4.22 - 5.81 MIL/uL   Hemoglobin 13.1 13.0 - 17.0 g/dL   HCT 42.4 39.0 - 52.0 %   MCV 92.2 80.0 - 100.0 fL   MCH 28.5 26.0 - 34.0 pg   MCHC 30.9 30.0 - 36.0 g/dL   RDW 15.8 (H) 11.5 - 15.5 %   Platelets 223 150 - 400 K/uL   nRBC 0.0 0.0 - 0.2 %    Comment: Performed at [facility], [place] [address] [place], [place] [address]  Troponin I (High Sensitivity)     Status: Abnormal   Collection Time: 09/15/19  9:22 PM  Result Value Ref Range   Troponin I (High Sensitivity) 18 (H) <18 ng/L    Comment: (NOTE) Elevated high sensitivity troponin I (hsTnI) values and significant  changes across serial measurements may suggest ACS but many other  chronic and acute conditions are known to elevate hsTnI results.  Refer to the "Links" section for chest pain algorithms and additional   guidance. Performed at [facility], [place] [address] [place], [place] [address]   Brain natriuretic peptide     Status: None   Collection Time: 09/15/19  9:38 PM  Result Value Ref Range   B Natriuretic Peptide 28.6 0.0 - 100.0 pg/mL    Comment: Performed at [facility] [address] [place], [place] [address]  SARS Coronavirus 2 [facility] order, Performed in [facility] hospital lab) Nasopharyngeal Nasopharyngeal Swab     Status: None   Collection Time: 09/15/19  9:57 PM   Specimen: Nasopharyngeal Swab  Result Value Ref Range   SARS Coronavirus 2 NEGATIVE NEGATIVE    Comment: (NOTE) If result is NEGATIVE SARS-CoV-2 target nucleic acids are NOT DETECTED. The SARS-CoV-2 RNA is generally detectable in upper and lower  respiratory specimens during the acute phase of infection. The lowest  concentration of SARS-CoV-2 viral copies this assay can detect is 250  copies / mL. A negative result does not preclude SARS-CoV-2 infection  and should not be used as the sole basis for treatment or other  patient management decisions.  A negative result may occur with  improper specimen collection / handling, submission of specimen other  than nasopharyngeal swab, presence of viral mutation(s) within the  areas targeted by this assay, and inadequate number of viral copies  (<250 copies / mL). A negative result must be combined with clinical  observations, patient history, and epidemiological information. If result is POSITIVE SARS-CoV-2 target nucleic acids are DETECTED. The SARS-CoV-2 RNA is generally detectable in upper and lower  respiratory specimens dur ing the acute phase of infection.  Positive  results are indicative of  active infection with SARS-CoV-2.  Clinical  correlation with patient history and other diagnostic information is  necessary to determine patient infection status.  Positive results do  not rule out bacterial infection or co-infection with other  viruses. If result is PRESUMPTIVE POSTIVE SARS-CoV-2 nucleic acids MAY BE PRESENT.   A presumptive positive result was obtained on the submitted specimen  and confirmed on repeat testing.  While 2019 novel coronavirus  (SARS-CoV-2) nucleic acids may be present in the submitted sample  additional confirmatory testing may be necessary for epidemiological  and / or clinical management purposes  to differentiate between  SARS-CoV-2 and other Sarbecovirus currently known to infect humans.  If clinically indicated additional testing with an alternate test  methodology [phone]) is advised. The SARS-CoV-2 RNA is generally  detectable in upper and lower respiratory sp ecimens during the acute  phase of infection. The expected result is Negative. Fact Sheet for Patients:  StrictlyIdeas.no Fact Sheet for Healthcare Providers: BankingDealers.co.za This test is not yet approved or cleared by the [place] FDA and has been authorized for detection and/or diagnosis of SARS-CoV-2 by FDA under an Emergency Use Authorization (EUA).  This EUA will remain in effect (meaning this test can be used) for the duration of the COVID-19 declaration under Section 564(b)(1) of the Act, 21 U.S.C. section 360bbb-3(b)(1), unless the authorization is terminated or revoked sooner. Performed at [facility], [place] [address], [place], [place] [address]   Lactic acid, plasma     Status: None   Collection Time: 09/15/19  9:58 PM  Result Value Ref Range   Lactic Acid, Venous 1.4 0.5 - 1.9 mmol/L    Comment: Performed at [facility] [address] [place], [place] [address]  POCT I-Stat EG7     Status: Abnormal   Collection Time: 09/15/19 10:09 PM  Result Value Ref Range   pH, Ven 7.438 (H) 7.250 - 7.430   pCO2, Ven 56.7 44.0 - 60.0 mmHg   pO2, Ven 161.0 (H) 32.0 - 45.0 mmHg   Bicarbonate 38.4 (H) 20.0 - 28.0 mmol/L   TCO2 40 (H) 22 - 32 mmol/L   O2  Saturation 99.0 %   Acid-Base Excess 12.0 (H) 0.0 - 2.0 mmol/L   Sodium 133 (L) 135 - 145 mmol/L   Potassium 4.1 3.5 - 5.1 mmol/L   Calcium, Ion 1.04 (L) 1.15 - 1.40 mmol/L   HCT 41.0 39.0 - 52.0 %   Hemoglobin 13.9 13.0 - 17.0 g/dL   Patient temperature HIDE    Sample type VENOUS   Troponin I (High Sensitivity)     Status: Abnormal   Collection Time: 09/15/19 11:22 PM  Result Value Ref Range   Troponin I (High Sensitivity) 18 (H) <18 ng/L    Comment: (NOTE) Elevated high sensitivity troponin I (hsTnI) values and significant  changes across serial measurements may suggest ACS but many other  chronic and acute conditions are known to elevate hsTnI results.  Refer to the "Links" section for chest pain algorithms and additional  guidance. Performed at [facility], [place] [name] [place], [place] [address]   CBG monitoring, ED     Status: Abnormal   Collection Time: 09/16/19  1:44 AM  Result Value Ref Range   Glucose-Capillary 267 (H) 70 - 99 mg/dL  Comprehensive metabolic panel     Status: Abnormal   Collection Time: 09/16/19  4:03 AM  Result Value Ref Range   Sodium 137 135 - 145 mmol/L  Potassium 4.1 3.5 - 5.1 mmol/L   Chloride 89 (L) 98 - 111 mmol/L   CO2 31 22 - 32 mmol/L   Glucose, Bld 243 (H) 70 - 99 mg/dL   BUN 92 (H) 8 - 23 mg/dL   Creatinine, Ser 2.18 (H) 0.61 - 1.24 mg/dL   Calcium 9.3 8.9 - 10.3 mg/dL   Total Protein 8.0 6.5 - 8.1 g/dL   Albumin 3.6 3.5 - 5.0 g/dL   AST 17 15 - 41 U/L   ALT 18 0 - 44 U/L   Alkaline Phosphatase 91 38 - 126 U/L   Total Bilirubin 0.7 0.3 - 1.2 mg/dL   GFR calc non Af Amer 29 (L) >60 mL/min   GFR calc Af Amer 33 (L) >60 mL/min   Anion gap 17 (H) 5 - 15    Comment: Performed at [facility] [address], [place], [place] [address]  CBC     Status: Abnormal   Collection Time: 09/16/19  4:03 AM  Result Value Ref Range   WBC 9.2 4.0 - 10.5 K/uL   RBC 4.69 4.22 - 5.81 MIL/uL   Hemoglobin 14.0 13.0 - 17.0 g/dL    HCT 42.7 39.0 - 52.0 %   MCV 91.0 80.0 - 100.0 fL   MCH 29.9 26.0 - 34.0 pg   MCHC 32.8 30.0 - 36.0 g/dL   RDW 15.8 (H) 11.5 - 15.5 %   Platelets 196 150 - 400 K/uL   nRBC 0.0 0.0 - 0.2 %    Comment: Performed at [facility], [place] [address], [place], [name] [address]  TSH     Status: None   Collection Time: 09/16/19  4:03 AM  Result Value Ref Range   TSH 2.222 0.350 - 4.500 uIU/mL    Comment: Performed by a 3rd Generation assay with a functional sensitivity of <=0.01 uIU/mL. Performed at [facility], [place] [address] [place], [place] [address]   CBG monitoring, ED     Status: Abnormal   Collection Time: 09/16/19  7:59 AM  Result Value Ref Range   Glucose-Capillary 306 (H) 70 - 99 mg/dL   Comment 1 Notify RN    Comment 2 Document in Chart    Dg Chest Portable 1 View  Result Date: 09/15/2019 CLINICAL DATA:  Chest pain and shortness of breath for 1 hour EXAM: PORTABLE CHEST 1 VIEW COMPARISON:  07/20/2019 FINDINGS: Cardiac shadow is stable. Postsurgical changes are seen. Previously seen basilar atelectasis has improved. Postsurgical changes in the cervical spine are noted as well. IMPRESSION: Improved aeration in the bases with mild residual atelectatic changes. Electronically Signed   By: Inez Catalina M.D.   On: 09/15/2019 22:16    Pending Labs Unresulted Labs (From admission, onward)    Start     Ordered   09/17/19 0500  Lipid panel  Tomorrow morning,   R     09/16/19 1235   09/16/19 0059  Hemoglobin A1c  Once,   STAT     09/16/19 0059          Vitals/Pain Today's Vitals   09/16/19 1000 09/16/19 1100 09/16/19 1150 09/16/19 1233  BP: 136/61 108/73    Pulse: 87 89    Resp: (!) 35 (!) 38    Temp:   (!) 97.3 F (36.3 C) (!) 97.4 F (36.3 C)  TempSrc:   Axillary Axillary  SpO2: 92% 96%    Weight:      Height:  PainSc:        Isolation Precautions No active isolations  Medications Medications  polyethylene glycol (MIRALAX / GLYCOLAX) packet 17 g  (17 g Oral Given 09/16/19 0139)  levothyroxine (SYNTHROID) tablet 175 mcg (175 mcg Oral Given 09/16/19 0948)  fesoterodine (TOVIAZ) tablet 8 mg (8 mg Oral Not Given 09/16/19 0501)  tamsulosin (FLOMAX) capsule 0.4 mg (0.4 mg Oral Not Given 09/16/19 0138)  spironolactone (ALDACTONE) tablet 25 mg (25 mg Oral Given 09/16/19 0949)  multivitamin with minerals tablet 1 tablet (1 tablet Oral Given 09/16/19 0948)  pramipexole (MIRAPEX) tablet 0.5 mg (has no administration in time range)  escitalopram (LEXAPRO) tablet 20 mg (20 mg Oral Given 09/16/19 0949)  insulin glargine (LANTUS) injection 60 Units (60 Units Subcutaneous Given 09/16/19 0236)  insulin aspart (novoLOG) injection 18 Units (18 Units Subcutaneous Given 09/16/19 0856)  atorvastatin (LIPITOR) tablet 20 mg (has no administration in time range)  gabapentin (NEURONTIN) capsule 400 mg (400 mg Oral Given 09/16/19 0948)  aspirin EC tablet 81 mg (81 mg Oral Given 09/16/19 0949)  torsemide (DEMADEX) tablet 100 mg (100 mg Oral Given 09/16/19 0948)  potassium chloride SA (K-DUR) CR tablet 20 mEq (20 mEq Oral Given 09/16/19 0949)  hydrALAZINE (APRESOLINE) tablet 37.5 mg (37.5 mg Oral Given 09/16/19 0949)  metolazone (ZAROXOLYN) tablet 2.5 mg (has no administration in time range)  insulin aspart (novoLOG) injection 0-20 Units (15 Units Subcutaneous Given 09/16/19 0857)  insulin aspart (novoLOG) injection 0-5 Units (3 Units Subcutaneous Given 09/16/19 0149)  heparin injection 5,000 Units (5,000 Units Subcutaneous Not Given 09/16/19 0500)  ondansetron (ZOFRAN) tablet 4 mg (has no administration in time range)    Or  ondansetron (ZOFRAN) injection 4 mg (has no administration in time range)  ipratropium-albuterol (DUONEB) 0.5-2.5 (3) MG/3ML nebulizer solution 3 mL (3 mLs Nebulization Given 09/16/19 0858)  cefTRIAXone (ROCEPHIN) 1 g in sodium chloride 0.9 % 100 mL IVPB (0 g Intravenous Stopped 09/16/19 0236)  pramipexole (MIRAPEX) tablet 1 mg (has no administration in time  range)  methylPREDNISolone sodium succinate (SOLU-MEDROL) 125 mg/2 mL injection 60 mg (has no administration in time range)  sodium chloride flush (NS) 0.9 % injection 3 mL (3 mLs Intravenous Given 09/15/19 2133)  furosemide (LASIX) 160 mg in dextrose 5 % 50 mL IVPB (0 mg Intravenous Stopped 09/16/19 0116)  insulin NPH Human (NOVOLIN N) injection 10 Units (10 Units Subcutaneous Given 09/16/19 0956)    Mobility walks with device High fall risk   Focused Assessments Cardiac Assessment Handoff:  Cardiac Rhythm: Normal sinus rhythm Lab Results  Component Value Date   CKTOTAL 1,156 (H) 07/12/2014   CKMB (HH) 05/09/2010    6.5 REPEATED TO VERIFY CRITICAL RESULT CALLED TO, READ BACK BY AND VERIFIED WITH: S HODGE,RN,1816,05/09/10,D BRADLEY   TROPONINI <0.03 05/01/2019   Lab Results  Component Value Date   DDIMER 0.62 (H) 07/20/2019   Does the Patient currently have chest pain? No     R Recommendations: See Admitting Provider Note  Report given to:   Additional Notes:

## 2019-09-16 NOTE — ED Notes (Signed)
Pt refused BiPap at this time, stating discomfort. RN placed pt back on 3L O2

## 2019-09-17 DIAGNOSIS — G63 Polyneuropathy in diseases classified elsewhere: Secondary | ICD-10-CM

## 2019-09-17 DIAGNOSIS — R269 Unspecified abnormalities of gait and mobility: Secondary | ICD-10-CM

## 2019-09-17 DIAGNOSIS — Z993 Dependence on wheelchair: Secondary | ICD-10-CM

## 2019-09-17 DIAGNOSIS — R600 Localized edema: Secondary | ICD-10-CM

## 2019-09-17 DIAGNOSIS — G2581 Restless legs syndrome: Secondary | ICD-10-CM

## 2019-09-17 DIAGNOSIS — L89152 Pressure ulcer of sacral region, stage 2: Secondary | ICD-10-CM

## 2019-09-17 DIAGNOSIS — E662 Morbid (severe) obesity with alveolar hypoventilation: Secondary | ICD-10-CM

## 2019-09-17 DIAGNOSIS — Z9981 Dependence on supplemental oxygen: Secondary | ICD-10-CM

## 2019-09-17 LAB — CBC
HCT: 40 % (ref 39.0–52.0)
Hemoglobin: 13.1 g/dL (ref 13.0–17.0)
MCH: 29.4 pg (ref 26.0–34.0)
MCHC: 32.8 g/dL (ref 30.0–36.0)
MCV: 89.9 fL (ref 80.0–100.0)
Platelets: 211 10*3/uL (ref 150–400)
RBC: 4.45 MIL/uL (ref 4.22–5.81)
RDW: 15.9 % — ABNORMAL HIGH (ref 11.5–15.5)
WBC: 11.3 10*3/uL — ABNORMAL HIGH (ref 4.0–10.5)
nRBC: 0 % (ref 0.0–0.2)

## 2019-09-17 LAB — BASIC METABOLIC PANEL
Anion gap: 16 — ABNORMAL HIGH (ref 5–15)
BUN: 113 mg/dL — ABNORMAL HIGH (ref 8–23)
CO2: 30 mmol/L (ref 22–32)
Calcium: 9.5 mg/dL (ref 8.9–10.3)
Chloride: 87 mmol/L — ABNORMAL LOW (ref 98–111)
Creatinine, Ser: 2.35 mg/dL — ABNORMAL HIGH (ref 0.61–1.24)
GFR calc Af Amer: 30 mL/min — ABNORMAL LOW (ref >60)
GFR calc non Af Amer: 26 mL/min — ABNORMAL LOW (ref >60)
Glucose, Bld: 328 mg/dL — ABNORMAL HIGH (ref 70–99)
Potassium: 4.8 mmol/L (ref 3.5–5.1)
Sodium: 133 mmol/L — ABNORMAL LOW (ref 135–145)

## 2019-09-17 LAB — LIPID PANEL
Cholesterol: 124 mg/dL (ref 0–200)
HDL: 44 mg/dL (ref >40)
LDL Cholesterol: 70 mg/dL (ref 0–99)
Total CHOL/HDL Ratio: 2.8 RATIO
Triglycerides: 50 mg/dL (ref <150)
VLDL: 10 mg/dL (ref 0–40)

## 2019-09-17 LAB — HEMOGLOBIN A1C
Hgb A1c MFr Bld: 9.4 % — ABNORMAL HIGH (ref 4.8–5.6)
Mean Plasma Glucose: 223 mg/dL

## 2019-09-17 LAB — GLUCOSE, CAPILLARY
Glucose-Capillary: 340 mg/dL — ABNORMAL HIGH (ref 70–99)
Glucose-Capillary: 385 mg/dL — ABNORMAL HIGH (ref 70–99)

## 2019-09-17 MED ORDER — ALBUTEROL SULFATE (2.5 MG/3ML) 0.083% IN NEBU: 2.5000 mg | INHALATION_SOLUTION | RESPIRATORY_TRACT | Status: DC | PRN

## 2019-09-17 MED ORDER — INSULIN GLARGINE 100 UNIT/ML ~~LOC~~ SOLN: 10.0000 [IU] | Freq: Every day | SUBCUTANEOUS | 11 refills | Status: DC

## 2019-09-17 MED ORDER — PREDNISONE 20 MG PO TABS: 40.0000 mg | ORAL_TABLET | Freq: Every day | ORAL | 0 refills | Status: AC

## 2019-09-17 MED ORDER — METHYLPREDNISOLONE SODIUM SUCC 40 MG IJ SOLR
40.0000 mg | Freq: Once | INTRAMUSCULAR | Status: AC
  Administered 2019-09-17: 40 mg via INTRAVENOUS
  Filled 2019-09-17: qty 1

## 2019-09-17 MED ORDER — IPRATROPIUM-ALBUTEROL 0.5-2.5 (3) MG/3ML IN SOLN
3.0000 mL | Freq: Three times a day (TID) | RESPIRATORY_TRACT | Status: DC
  Filled 2019-09-17: qty 3

## 2019-09-17 MED ORDER — CEPHALEXIN 250 MG PO CAPS
500.0000 mg | ORAL_CAPSULE | Freq: Three times a day (TID) | ORAL | Status: DC
  Administered 2019-09-17: 12:00:00 500 mg via ORAL
  Filled 2019-09-17: qty 2

## 2019-09-17 MED ORDER — INSULIN GLARGINE 100 UNIT/ML ~~LOC~~ SOLN
20.0000 [IU] | Freq: Every day | SUBCUTANEOUS | Status: DC
  Administered 2019-09-17: 20 [IU] via SUBCUTANEOUS
  Filled 2019-09-17: qty 0.2

## 2019-09-17 NOTE — Discharge Summary (Signed)
Physician Discharge Summary  Travis [name] XFG:[id number] DOB: May 09, 1946 DOA: 09/15/2019  PCP: [name], MD  Admit date: 09/15/2019 Discharge date: 09/17/2019  Admitted From: Home Disposition: Home  Recommendations for Outpatient Follow-up:  1. 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)   Obesity, Class III, BMI 40-49.9 (morbid obesity) ([place])   Acute on chronic respiratory failure with hypoxia and hypercapnia (HCC)   Type 2 diabetes mellitus with hyperglycemia, with long-term current use of insulin (HCC)   Supplemental oxygen dependent   RLS (restless legs syndrome)   Lower extremity edema   Pressure injury of sacral region, stage 2 ([place])     Brief Summary: This is a 73 year old male with morbid obesity, obesity hypoventilation on 2.5 L of oxygen, OSA on a BiPAP at night, chronic diastolic heart failure, chronic pedal edema, wheelchair-bound and only able to stand and pivot, peripheral neuropathy and degenerative disc disease. The history is obtained from the patient and patient's daughter-in-law over the phone who is a nurse here.  She states that he was noted to have chest pain at home which is why he was sent to the hospital.   It occurred while he was sitting on the couch for at least 30 minutes and was present in the center of his chest.  He also had shortness of breath.  His daughter-in-law called his cardiologist, Dr. Acie Fredrickson, who recommended the patient be evaluated in the ED. In the ED the patient no longer was having pain however  he was quite short of breath and using abdominal muscles to breathe and was started on the hospital BiPAP but could not tolerate it initially.  Apart from his own home BiPAP was delivered and he was subsequently started on this. He was admitted for further work-up of his chest pain and for management of acute respiratory distress.  Hospital Course:  Chest pain - EKG unrevealing- high-sensitivity troponins remained low around 18 -LDL is 70 -The patient was evaluated by cardiology, Dr. Pernell Dupre, who felt reassured by his EKG and troponins which suggested a type I injury and did not recommend further work-up the patient did not have any recurrence of his chest pain during the hospital stay  Acute respiratory failure-question viral infection - Noted to be wheezing in the ED and it was suspected that he may benefit from steroids for reactive airways which may be precipitated by a viral infection He did admit to increased cough productive of clear sputum -He has been evaluated by Dr. Melvyn Novas, pulmonary, and 2019 who did not feel that he had COPD -We placed him on a BiPAP and gave him IV steroids and belies her treatments- he is feeling much better today without wheezing I will give him 3 more days of steroids  Recent UTI -He is being treated with Keflex for this-she has a history of frequent UTIs  Diabetes mellitus type 2 insulin requiring -Sugars a little high in the hospital due to steroids - We have given him extra insulin in the hospital and recommended he take a little extra over the next 3  days while he is on prednisone  Chronic diastolic heart failure/AKI - He was given IV Lasix 160 mg in the ED however, later determined not to be in acute heart failure-BNP 28.6 as opposed to 70 in August-chest x-ray later found to have no pulmonary edema - Today his BUN and creatinine has risen from 92/2.18 to 113/2.35.  His cardiologist and I agreed that yesterday he appeared to be fluid depleted.   I have  held his diuretics which can hopefully be resumed tomorrow-I have spoken with his daughter-in-law, [name], who will reassess him tomorrow to make this determination and also touch base with his heart failure team  All other medical problems including hypothyroidism, BPH, anxiety/depression have remained stable.  He also has a stage II pressure injury which is being managed by outpatient wound care.   Discharge Exam: Vitals:   09/17/19 0722 09/17/19 0827  BP:  118/84  Pulse:  96  Resp:    Temp:    SpO2: 90%    Vitals:   09/17/19 0436 09/17/19 0459 09/17/19 0722 09/17/19 0827  BP: (!) 148/78   118/84  Pulse: 85 89  96  Resp: 20     Temp: 97.6 F (36.4 C)     TempSrc: Oral     SpO2: (!) 85% 92% 90%   Weight:      Height:        General: Pt is alert, awake, not in acute distress Cardiovascular: RRR, S1/S2 +, no rubs, no gallops Respiratory: CTA bilaterally, no wheezing, no rhonchi-mild congested cough today-on 2.5 L of oxygen Abdominal: Soft, NT, ND, bowel sounds + Extremities: Trace pitting edema, legs are slightly erythematous, no cyanosis   Discharge Instructions  Discharge Instructions    Diet - low sodium heart healthy   Complete by: As directed    Increase activity slowly   Complete by: As directed      Allergies as of 09/17/2019      Reactions   Amlodipine Swelling, Other (See Comments)   Causes edema (per [facility] paperwork)   Plendil [felodipine] Swelling, Other (See Comments)   Causes edema (per [facility] paperwork)   Compazine [prochlorperazine] Other (See Comments)   Caused agitation (per [facility] paperwork)   Cymbalta [duloxetine Hcl] Other (See Comments)   Made the patient "feel weird," per [facility] paperwork   Jardiance [empagliflozin] Other (See Comments)   Stopped by MD because of unwanted side effects   Other Nausea Only, Other (See Comments)   ANESTHESIA UNSPECIFIED- makes sick, must have anti-nausea medication in  advance   Levofloxacin In D5w Itching   Possible infusion reaction 03/29/17. Tolerating oral Levaquin 03/30/17   Septra [sulfamethoxazole-trimethoprim] Nausea Only, Other (See Comments)   Per [facility] paperwork      Medication List    TAKE these medications   aspirin EC 81 MG tablet Take 81 mg by mouth daily.   atorvastatin 20 MG tablet Commonly known as: LIPITOR Take 20 mg by mouth every evening.   cephALEXin 500 MG capsule Commonly known as: KEFLEX Take 500 mg by mouth every 8 (eight) hours. FOR 10 DAYS   escitalopram 20 MG tablet Commonly known as: LEXAPRO Take 20 mg by mouth daily.   FreeStyle Libre 14 Day Reader Kerrin Mo 1 kit by Does not apply route 3 (three) times daily.   FreeStyle Libre 14 Day Sensor Misc 1 kit by Does not apply route 3 (three) times daily.   gabapentin 400 MG capsule Commonly known as: NEURONTIN  Take 400 mg by mouth 3 (three) times daily.   glucose blood test strip Use as instructed to test blood sugars 4 times daily DX E11.42   hydrALAZINE 25 MG tablet Commonly known as: APRESOLINE Take 1.5 tablets (37.5 mg total) by mouth 3 (three) times daily.   insulin aspart 100 UNIT/ML FlexPen Commonly known as: NovoLOG FlexPen Inject 18 Units into the skin 3 (three) times daily with meals.   Insulin Glargine 100 UNIT/ML Solostar Pen Commonly known as: Lantus SoloStar Inject 60 Units into the skin at bedtime. What changed: Another medication with the same name was added. Make sure you understand how and when to take each.   insulin glargine 100 UNIT/ML injection Commonly known as: LANTUS Inject 0.1 mLs (10 Units total) into the skin daily. What changed: You were already taking a medication with the same name, and this prescription was added. Make sure you understand how and when to take each.   Insulin Pen Needle 32G X 6 MM Misc Commonly known as: BD Pen Needle Micro U/F Four times daily   Insulin Pen Needle 32G X 6 MM Misc Commonly known  as: BD Pen Needle Micro U/F 4 times a day   levothyroxine 175 MCG tablet Commonly known as: SYNTHROID Take 1 tablet (175 mcg total) by mouth daily.   metolazone 2.5 MG tablet Commonly known as: ZAROXOLYN Take 1 tablet (2.5 mg total) by mouth daily.   multivitamin Tabs tablet Take 1 tablet by mouth daily.   onetouch ultrasoft lancets Use as instructed to test blood sugars 4 times daily DX E11.42   OneTouch Verio w/Device Kit 1 kit by Does not apply route 4 (four) times daily. Use as directed to test blood sugars 4 times daily DX E11.42   OXYGEN Inhale 2-3 L/min into the lungs continuous.   polyethylene glycol 17 g packet Commonly known as: MIRALAX / GLYCOLAX Take 17 g by mouth daily as needed. For constipation What changed:   when to take this  additional instructions   potassium chloride SA 20 MEQ tablet Commonly known as: K-DUR Take 1 tablet (20 mEq total) by mouth 2 (two) times daily. Take 1 tab only on days you take Metolazone What changed:   when to take this  additional instructions   pramipexole 0.5 MG tablet Commonly known as: MIRAPEX One tablet in the morning and 2 in the evening What changed:   how much to take  how to take this  when to take this  additional instructions   predniSONE 20 MG tablet Commonly known as: DELTASONE Take 2 tablets (40 mg total) by mouth daily for 3 days.   spironolactone 25 MG tablet Commonly known as: ALDACTONE Take 1 tablet (25 mg total) by mouth daily.   tamsulosin 0.4 MG Caps capsule Commonly known as: FLOMAX Take 1 capsule (0.4 mg total) by mouth daily. What changed: when to take this   torsemide 100 MG tablet Commonly known as: DEMADEX Take 1 tablet (100 mg total) by mouth 2 (two) times daily.   Toviaz 8 MG Tb24 tablet Generic drug: fesoterodine Take 1 tablet (8 mg total) by mouth daily. What changed: when to take this       Allergies  Allergen Reactions  . Amlodipine Swelling and Other (See  Comments)    Causes edema (per [facility] paperwork)  . Plendil [Felodipine] Swelling and Other (See Comments)    Causes edema (per [facility] paperwork)  . Compazine [Prochlorperazine] Other (See Comments)  Caused agitation (per [facility] paperwork)  . Cymbalta [Duloxetine Hcl] Other (See Comments)    Made the patient "feel weird," per [facility] paperwork  . Jardiance [Empagliflozin] Other (See Comments)    Stopped by MD because of unwanted side effects  . Other Nausea Only and Other (See Comments)    ANESTHESIA UNSPECIFIED- makes sick, must have anti-nausea medication in advance  . Levofloxacin In D5w Itching    Possible infusion reaction 03/29/17. Tolerating oral Levaquin 03/30/17  . Septra [Sulfamethoxazole-Trimethoprim] Nausea Only and Other (See Comments)    Per [facility] paperwork     Procedures/Studies:    Dg Chest Portable 1 View  Result Date: 09/15/2019 CLINICAL DATA:  Chest pain and shortness of breath for 1 hour EXAM: PORTABLE CHEST 1 VIEW COMPARISON:  07/20/2019 FINDINGS: Cardiac shadow is stable. Postsurgical changes are seen. Previously seen basilar atelectasis has improved. Postsurgical changes in the cervical spine are noted as well. IMPRESSION: Improved aeration in the bases with mild residual atelectatic changes. Electronically Signed   By: Inez Catalina M.D.   On: 09/15/2019 22:16     The results of significant diagnostics from this hospitalization (including imaging, microbiology, ancillary and laboratory) are listed below for reference.     Microbiology: Recent Results (from the past 240 hour(s))  SARS Coronavirus 2 [facility] order, Performed in [facility] hospital lab) Nasopharyngeal Nasopharyngeal Swab     Status: None   Collection Time: 09/15/19  9:57 PM   Specimen: Nasopharyngeal Swab  Result Value Ref Range Status   SARS Coronavirus 2 NEGATIVE NEGATIVE Final    Comment: (NOTE) If result is NEGATIVE SARS-CoV-2 target  nucleic acids are NOT DETECTED. The SARS-CoV-2 RNA is generally detectable in upper and lower  respiratory specimens during the acute phase of infection. The lowest  concentration of SARS-CoV-2 viral copies this assay can detect is 250  copies / mL. A negative result does not preclude SARS-CoV-2 infection  and should not be used as the sole basis for treatment or other  patient management decisions.  A negative result may occur with  improper specimen collection / handling, submission of specimen other  than nasopharyngeal swab, presence of viral mutation(s) within the  areas targeted by this assay, and inadequate number of viral copies  (<250 copies / mL). A negative result must be combined with clinical  observations, patient history, and epidemiological information. If result is POSITIVE SARS-CoV-2 target nucleic acids are DETECTED. The SARS-CoV-2 RNA is generally detectable in upper and lower  respiratory specimens dur ing the acute phase of infection.  Positive  results are indicative of active infection with SARS-CoV-2.  Clinical  correlation with patient history and other diagnostic information is  necessary to determine patient infection status.  Positive results do  not rule out bacterial infection or co-infection with other viruses. If result is PRESUMPTIVE POSTIVE SARS-CoV-2 nucleic acids MAY BE PRESENT.   A presumptive positive result was obtained on the submitted specimen  and confirmed on repeat testing.  While 2019 novel coronavirus  (SARS-CoV-2) nucleic acids may be present in the submitted sample  additional confirmatory testing may be necessary for epidemiological  and / or clinical management purposes  to differentiate between  SARS-CoV-2 and other Sarbecovirus currently known to infect humans.  If clinically indicated additional testing with an alternate test  methodology [id number]) is advised. The SARS-CoV-2 RNA is generally  detectable in upper and lower  respiratory sp ecimens during the acute  phase of infection. The expected result is  Negative. Fact Sheet for Patients:  StrictlyIdeas.no Fact Sheet for Healthcare Providers: BankingDealers.co.za This test is not yet approved or cleared by the [place] FDA and has been authorized for detection and/or diagnosis of SARS-CoV-2 by FDA under an Emergency Use Authorization (EUA).  This EUA will remain in effect (meaning this test can be used) for the duration of the COVID-19 declaration under Section 564(b)(1) of the Act, 21 U.S.C. section 360bbb-3(b)(1), unless the authorization is terminated or revoked sooner. Performed at [facility], [place] [address], [place], [place] [address]      Labs: BNP (last 3 results) Recent Labs    07/20/19 1745 08/03/19 1040 09/15/19 2138  BNP 23.2 70.1 46.5   Basic Metabolic Panel: Recent Labs  Lab 09/15/19 2122 09/15/19 2209 09/16/19 0403 09/17/19 0542  NA 134* 133* 137 133*  K 4.4 4.1 4.1 4.8  CL 88*  --  89* 87*  CO2 31  --  31 30  GLUCOSE 337*  --  243* 328*  BUN 96*  --  92* 113*  CREATININE 2.30*  --  2.18* 2.35*  CALCIUM 9.0  --  9.3 9.5   Liver Function Tests: Recent Labs  Lab 09/16/19 0403  AST 17  ALT 18  ALKPHOS 91  BILITOT 0.7  PROT 8.0  ALBUMIN 3.6   No results for input(s): LIPASE, AMYLASE in the last 168 hours. No results for input(s): AMMONIA in the last 168 hours. CBC: Recent Labs  Lab 09/15/19 2122 09/15/19 2209 09/16/19 0403 09/17/19 0542  WBC 10.6*  --  9.2 11.3*  HGB 13.1 13.9 14.0 13.1  HCT 42.4 41.0 42.7 40.0  MCV 92.2  --  91.0 89.9  PLT 223  --  196 211   Cardiac Enzymes: No results for input(s): CKTOTAL, CKMB, CKMBINDEX, TROPONINI in the last 168 hours. BNP: Invalid input(s): POCBNP CBG: Recent Labs  Lab 09/16/19 0759 09/16/19 1323 09/16/19 1724 09/16/19 2226 09/17/19 0627  GLUCAP 306* 359* 331* 330* 340*   D-Dimer No results  for input(s): DDIMER in the last 72 hours. Hgb A1c Recent Labs    09/16/19 0403  HGBA1C 9.4*   Lipid Profile Recent Labs    09/17/19 0542  CHOL 124  HDL 44  LDLCALC 70  TRIG 50  CHOLHDL 2.8   Thyroid function studies Recent Labs    09/16/19 0403  TSH 2.222   Anemia work up No results for input(s): VITAMINB12, FOLATE, FERRITIN, TIBC, IRON, RETICCTPCT in the last 72 hours. Urinalysis    Component Value Date/Time   COLORURINE YELLOW 07/20/2019 1728   APPEARANCEUR CLEAR 07/20/2019 1728   LABSPEC 1.020 08/06/2019 1739   PHURINE 6.5 08/06/2019 1739   GLUCOSEU NEGATIVE 08/06/2019 1739   HGBUR NEGATIVE 08/06/2019 1739   BILIRUBINUR NEGATIVE 08/06/2019 1739   KETONESUR NEGATIVE 08/06/2019 1739   PROTEINUR NEGATIVE 08/06/2019 1739   UROBILINOGEN 0.2 08/06/2019 1739   NITRITE POSITIVE (A) 08/06/2019 1739   LEUKOCYTESUR SMALL (A) 08/06/2019 1739   Sepsis Labs Invalid input(s): PROCALCITONIN,  WBC,  LACTICIDVEN Microbiology Recent Results (from the past 240 hour(s))  SARS Coronavirus 2 [facility] order, Performed in [facility] hospital lab) Nasopharyngeal Nasopharyngeal Swab     Status: None   Collection Time: 09/15/19  9:57 PM   Specimen: Nasopharyngeal Swab  Result Value Ref Range Status   SARS Coronavirus 2 NEGATIVE NEGATIVE Final    Comment: (NOTE) If result is NEGATIVE SARS-CoV-2 target nucleic acids are NOT DETECTED. The SARS-CoV-2 RNA is generally detectable in  upper and lower  respiratory specimens during the acute phase of infection. The lowest  concentration of SARS-CoV-2 viral copies this assay can detect is 250  copies / mL. A negative result does not preclude SARS-CoV-2 infection  and should not be used as the sole basis for treatment or other  patient management decisions.  A negative result may occur with  improper specimen collection / handling, submission of specimen other  than nasopharyngeal swab, presence of viral mutation(s) within the  areas  targeted by this assay, and inadequate number of viral copies  (<250 copies / mL). A negative result must be combined with clinical  observations, patient history, and epidemiological information. If result is POSITIVE SARS-CoV-2 target nucleic acids are DETECTED. The SARS-CoV-2 RNA is generally detectable in upper and lower  respiratory specimens dur ing the acute phase of infection.  Positive  results are indicative of active infection with SARS-CoV-2.  Clinical  correlation with patient history and other diagnostic information is  necessary to determine patient infection status.  Positive results do  not rule out bacterial infection or co-infection with other viruses. If result is PRESUMPTIVE POSTIVE SARS-CoV-2 nucleic acids MAY BE PRESENT.   A presumptive positive result was obtained on the submitted specimen  and confirmed on repeat testing.  While 2019 novel coronavirus  (SARS-CoV-2) nucleic acids may be present in the submitted sample  additional confirmatory testing may be necessary for epidemiological  and / or clinical management purposes  to differentiate between  SARS-CoV-2 and other Sarbecovirus currently known to infect humans.  If clinically indicated additional testing with an alternate test  methodology [id number]) is advised. The SARS-CoV-2 RNA is generally  detectable in upper and lower respiratory sp ecimens during the acute  phase of infection. The expected result is Negative. Fact Sheet for Patients:  StrictlyIdeas.no Fact Sheet for Healthcare Providers: BankingDealers.co.za This test is not yet approved or cleared by the [place] FDA and has been authorized for detection and/or diagnosis of SARS-CoV-2 by FDA under an Emergency Use Authorization (EUA).  This EUA will remain in effect (meaning this test can be used) for the duration of the COVID-19 declaration under Section 564(b)(1) of the Act, 21 U.S.C. section  360bbb-3(b)(1), unless the authorization is terminated or revoked sooner. Performed at [facility], [place] [address], [place], [place] [address]      Time coordinating discharge in minutes: 26  SIGNED:   Debbe Odea, MD  [facility] 09/17/2019, 9:57 AM Pager   If 7PM-7AM, please contact night-coverage [email or web address] Password [email or web address]

## 2019-09-17 NOTE — Discharge Instructions (Signed)
Please take an extra 10 U of Lantus in the AM as long a you are taking the Prednisone. -Prednisone is been started for wheezing and shortness of breath that was noted when he first arrived.  You have also complained of a cough with clear sputum.  We suspect you may have a viral infection.  You were cared for by a hospitalist during your hospital stay. If you have any questions about your discharge medications or the care you received while you were in the hospital after you are discharged, you can call the unit and asked to speak with the hospitalist on call if the hospitalist that took care of you is not available. Once you are discharged, your primary care physician will handle any further medical issues.   Please note that NO REFILLS for any discharge medications will be authorized once you are discharged, as it is imperative that you return to your primary care physician (or establish a relationship with a primary care physician if you do not have one) for your aftercare needs so that they can reassess your need for medications and monitor your lab values.  Please take all your medications with you for your next visit with your Primary MD. Please ask your Primary MD to get all Hospital records sent to his/her office. Please request your Primary MD to go over all hospital test results at the follow up.   If you experience worsening of your admission symptoms, develop shortness of breath, chest pain, suicidal or homicidal thoughts or a life threatening emergency, you must seek medical attention immediately by calling 911 or calling your MD.   Dennis Bast must read the complete instructions/literature along with all the possible adverse reactions/side effects for all the medicines you take including new medications that have been prescribed to you. Take new medicines after you have completely understood and accpet all the possible adverse reactions/side effects.    Do not drive when taking pain medications  or sedatives.     Do not take more than prescribed Pain, Sleep and Anxiety Medications   If you have smoked or chewed Tobacco in the last 2 yrs please stop. Stop any regular alcohol  and or recreational drug use.   Wear Seat belts while driving.

## 2019-09-20 DIAGNOSIS — S81802A Unspecified open wound, left lower leg, initial encounter: Secondary | ICD-10-CM | POA: Diagnosis not present

## 2019-09-20 DIAGNOSIS — M4800 Spinal stenosis, site unspecified: Secondary | ICD-10-CM | POA: Diagnosis not present

## 2019-09-20 DIAGNOSIS — S81812A Laceration without foreign body, left lower leg, initial encounter: Secondary | ICD-10-CM | POA: Diagnosis not present

## 2019-09-20 DIAGNOSIS — G9340 Encephalopathy, unspecified: Secondary | ICD-10-CM | POA: Diagnosis not present

## 2019-09-20 DIAGNOSIS — I5033 Acute on chronic diastolic (congestive) heart failure: Secondary | ICD-10-CM | POA: Diagnosis not present

## 2019-09-20 DIAGNOSIS — L89152 Pressure ulcer of sacral region, stage 2: Secondary | ICD-10-CM | POA: Diagnosis not present

## 2019-09-22 ENCOUNTER — Other Ambulatory Visit: Payer: Self-pay

## 2019-09-22 ENCOUNTER — Ambulatory Visit (HOSPITAL_COMMUNITY)
Admission: RE | Admit: 2019-09-22 | Discharge: 2019-09-22 | Disposition: A | Discharge status: Home / Self Care | Payer: PPO | Source: Ambulatory Visit | Attending: Internal Medicine | Admitting: Internal Medicine

## 2019-09-22 ENCOUNTER — Encounter (HOSPITAL_COMMUNITY): Payer: Self-pay

## 2019-09-22 VITALS — BP 136/70 | HR 74

## 2019-09-22 DIAGNOSIS — E1122 Type 2 diabetes mellitus with diabetic chronic kidney disease: Secondary | ICD-10-CM | POA: Diagnosis not present

## 2019-09-22 DIAGNOSIS — I2510 Atherosclerotic heart disease of native coronary artery without angina pectoris: Secondary | ICD-10-CM | POA: Insufficient documentation

## 2019-09-22 DIAGNOSIS — Z882 Allergy status to sulfonamides status: Secondary | ICD-10-CM | POA: Diagnosis not present

## 2019-09-22 DIAGNOSIS — Z7982 Long term (current) use of aspirin: Secondary | ICD-10-CM | POA: Diagnosis not present

## 2019-09-22 DIAGNOSIS — Z993 Dependence on wheelchair: Secondary | ICD-10-CM | POA: Diagnosis not present

## 2019-09-22 DIAGNOSIS — E1165 Type 2 diabetes mellitus with hyperglycemia: Secondary | ICD-10-CM | POA: Diagnosis not present

## 2019-09-22 DIAGNOSIS — I5022 Chronic systolic (congestive) heart failure: Secondary | ICD-10-CM

## 2019-09-22 DIAGNOSIS — Z888 Allergy status to other drugs, medicaments and biological substances status: Secondary | ICD-10-CM | POA: Diagnosis not present

## 2019-09-22 DIAGNOSIS — Z881 Allergy status to other antibiotic agents status: Secondary | ICD-10-CM | POA: Diagnosis not present

## 2019-09-22 DIAGNOSIS — J449 Chronic obstructive pulmonary disease, unspecified: Secondary | ICD-10-CM | POA: Insufficient documentation

## 2019-09-22 DIAGNOSIS — Z79899 Other long term (current) drug therapy: Secondary | ICD-10-CM | POA: Diagnosis not present

## 2019-09-22 DIAGNOSIS — Z794 Long term (current) use of insulin: Secondary | ICD-10-CM | POA: Insufficient documentation

## 2019-09-22 DIAGNOSIS — I130 Hypertensive heart and chronic kidney disease with heart failure and stage 1 through stage 4 chronic kidney disease, or unspecified chronic kidney disease: Secondary | ICD-10-CM | POA: Diagnosis not present

## 2019-09-22 DIAGNOSIS — Z9981 Dependence on supplemental oxygen: Secondary | ICD-10-CM | POA: Diagnosis not present

## 2019-09-22 DIAGNOSIS — G4733 Obstructive sleep apnea (adult) (pediatric): Secondary | ICD-10-CM

## 2019-09-22 DIAGNOSIS — N183 Chronic kidney disease, stage 3 unspecified: Secondary | ICD-10-CM

## 2019-09-22 DIAGNOSIS — G2581 Restless legs syndrome: Secondary | ICD-10-CM | POA: Diagnosis not present

## 2019-09-22 DIAGNOSIS — J9611 Chronic respiratory failure with hypoxia: Secondary | ICD-10-CM | POA: Insufficient documentation

## 2019-09-22 DIAGNOSIS — Z87891 Personal history of nicotine dependence: Secondary | ICD-10-CM | POA: Diagnosis not present

## 2019-09-22 DIAGNOSIS — R0600 Dyspnea, unspecified: Secondary | ICD-10-CM | POA: Diagnosis not present

## 2019-09-22 DIAGNOSIS — Z8249 Family history of ischemic heart disease and other diseases of the circulatory system: Secondary | ICD-10-CM | POA: Diagnosis not present

## 2019-09-22 DIAGNOSIS — E039 Hypothyroidism, unspecified: Secondary | ICD-10-CM | POA: Insufficient documentation

## 2019-09-22 DIAGNOSIS — I5032 Chronic diastolic (congestive) heart failure: Secondary | ICD-10-CM | POA: Insufficient documentation

## 2019-09-22 DIAGNOSIS — Z833 Family history of diabetes mellitus: Secondary | ICD-10-CM | POA: Insufficient documentation

## 2019-09-22 LAB — BASIC METABOLIC PANEL
Anion gap: 16 — ABNORMAL HIGH (ref 5–15)
BUN: 118 mg/dL — ABNORMAL HIGH (ref 8–23)
CO2: 33 mmol/L — ABNORMAL HIGH (ref 22–32)
Calcium: 9 mg/dL (ref 8.9–10.3)
Chloride: 83 mmol/L — ABNORMAL LOW (ref 98–111)
Creatinine, Ser: 2 mg/dL — ABNORMAL HIGH (ref 0.61–1.24)
GFR calc Af Amer: 37 mL/min — ABNORMAL LOW (ref >60)
GFR calc non Af Amer: 32 mL/min — ABNORMAL LOW (ref >60)
Glucose, Bld: 436 mg/dL — ABNORMAL HIGH (ref 70–99)
Potassium: 3.8 mmol/L (ref 3.5–5.1)
Sodium: 132 mmol/L — ABNORMAL LOW (ref 135–145)

## 2019-09-22 NOTE — Progress Notes (Signed)
[facility]  ID:  Travis [name], Travis [name] 1946-03-04, MRN [id number]  Location: Home  Provider location: [address] [place] Type of Visit: Established patient   PCP:  [name], MD  Cardiologist:  Mertie Moores, MD Primary HF: Dr [name]  Chief Complaint: Heart Failure   History of Present Illness: Travis [name] is a 73 y.o. 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  Admitted 5/2-5/13/20 with volume overload. Diuresed with lasix drip and metolazone. Had AKI that improved with switching back to oral diuretics. Also treated for cellulitis with doxy. PT recommended SNF, but he refused. DC weight 281 lbs. He was discharged on torsemide 80 mg daily with 2.5 mg metolazone weekly. Discharged with [facility].   Admitted 09/01/99 with A/C diastolic HF. Diuresed with lasix drip and later transitioned to torsemide 80 mg twice a day + metolazone twice a week.Discharge weight was 285 pounds.  Re-admitted 07/21/19 with AMS. He was treated for UTI and sent home the next day.  Admitted 09/16/19 with chest pain. Placed on Bipap HS Trop low. Given steroids and due to concern for AKI diuretics held a day.    Today he returns for HF follow up.Overall feeling weak. He does not wear oxygen.SOB with exertion. He is essentially wheel chair bound. Requires assistance with ADLs. +orhtopnea. Not wearing oxygen at home. Appetite ok. No fever or chills. Unable to weigh at home due to weakness. Taking all medications.  RHC 7/20//20  RA = 8 RV = 42/9 PA = 44/11 (26) Mild pulmonary hypertension.  PCW = 13 Fick cardiac output/index = 8.8/3.5 PVR = 1.5 WU Ao sat = 93% PA sat = 79%, 71%  RHC 06/30/2019 RA = 8 RV = 42/9 PA = 44/11 (26) PCW = 13 Fick cardiac output/index = 8.8/3.5 PVR = 1.5 WU Ao sat = 93% PA sat = 79%, 71% Assessment: 1. Mild PAH 2. Well  compensated filling pressures and output Metolazone to twice a week (Monday and Friday). Dry weight now 285-287.     Past Medical History:  Diagnosis Date  . Cervical myelopathy (HCC)    with myelomalacia at C5-6 and C3-4  . CHF (congestive heart failure) ([place])   . Constipation - functional    controlled with miralax  . COPD (chronic obstructive pulmonary disease) (HCC)    mild  . DJD (degenerative joint disease)    WHOLE SPINE  . Dyspnea   . Gait disorder   . Heart murmur    years ago   . Hypertension   . Hypothyroidism    Low d/t treatment of hyperthyroidism  . Lumbosacral spinal stenosis 03/13/2014  . On home oxygen therapy    "2.5L all the time" (06/29/2018)  . Peripheral neuropathy   . Pneumonia   . PONV (postoperative nausea and vomiting)   . RBBB   . Renal disorder    acute kidney failure  . Restless legs syndrome 03/14/2015  . RLS (restless legs syndrome)   . Sleep apnea    DX  2009/2010 [facility] done at [facility]  . 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 Type II diabetes mellitus ([place])    diet controlled   Past Surgical History:  Procedure Laterality Date  . BACK SURGERY     6 surgeries  . CARDIAC CATHETERIZATION [name] 09/07/2015   Procedure: Right/Left Heart Cath and Coronary Angiography;  Surgeon:  Peter M Martinique, MD;  Location: [facility];  Service: Cardiovascular;  Laterality: [name];  . CHOLECYSTECTOMY    . EYE SURGERY     BIL CATARACTS  . LUMBAR LAMINECTOMY/DECOMPRESSION MICRODISCECTOMY  07/14/2012   Procedure: LUMBAR LAMINECTOMY/DECOMPRESSION MICRODISCECTOMY 2 LEVELS;  Surgeon: Eustace Moore, MD;  Location: [facility];  Service: Neurosurgery;  Laterality: Left;  Lumbar two three laminectomy, left thoracic four five transpedicular diskectomy  . POSTERIOR CERVICAL FUSION/FORAMINOTOMY  04/08/2018   Procedure: Laminectomy and Foraminotomy - Lumbar Four-Lumbar Five, instrumented posterolateral fusion Lumbar Four- Five, Laminectomy and Foraminotomy - Thoracic  One-Thoracic Two;  Surgeon: Eustace Moore, MD;  Location: [facility] OR;  Service: Neurosurgery;;  posterior  . POSTERIOR LUMBAR FUSION  04/08/2018   Laminectomy and Foraminotomy - Lumbar Four-Lumbar Five, instrumented posterolateral fusion Lumbar Four- Five, Laminectomy and Foraminotomy - Thoracic One-Thoracic Two  . POSTERIOR LUMBAR FUSION  04/08/2018   Laminectomy and Foraminotomy - Lumbar Four-Lumbar Five, instrumented posterolateral fusion Lumbar Four- Five, Laminectomy and Foraminotomy - Thoracic One-Thoracic Two  . RIGHT HEART CATH [name] 07/18/2019   Procedure: RIGHT HEART CATH;  Surgeon: Jolaine Artist, MD;  Location: [facility];  Service: Cardiovascular;  Laterality: [name];     Current Outpatient Medications  Medication Sig Dispense Refill  . aspirin EC 81 MG tablet Take 81 mg by mouth daily.    [name] atorvastatin (LIPITOR) 20 MG tablet Take 20 mg by mouth every evening.     . Blood Glucose Monitoring Suppl (ONETOUCH VERIO) w/Device KIT 1 kit by Does not apply route 4 (four) times daily. Use as directed to test blood sugars 4 times daily DX E11.42 1 kit 0  . Continuous Blood Gluc Receiver (FREESTYLE LIBRE 14 DAY READER) DEVI 1 kit by Does not apply route 3 (three) times daily. 1 Device 0  . Continuous Blood Gluc Sensor (FREESTYLE LIBRE 14 DAY SENSOR) MISC 1 kit by Does not apply route 3 (three) times daily. 3 each 11  . escitalopram (LEXAPRO) 20 MG tablet Take 20 mg by mouth daily.    [name] gabapentin (NEURONTIN) 400 MG capsule Take 400 mg by mouth 3 (three) times daily.    [name] glucose blood test strip Use as instructed to test blood sugars 4 times daily DX E11.42 100 each 12  . hydrALAZINE (APRESOLINE) 25 MG tablet Take 1.5 tablets (37.5 mg total) by mouth 3 (three) times daily. 135 tablet 5  . insulin aspart (NOVOLOG FLEXPEN) 100 UNIT/ML FlexPen Inject 18 Units into the skin 3 (three) times daily with meals. 15 mL 11  . Insulin Glargine (LANTUS SOLOSTAR) 100 UNIT/ML Solostar Pen Inject 60  Units into the skin at bedtime. 15 mL 11  . insulin glargine (LANTUS) 100 UNIT/ML injection Inject 0.1 mLs (10 Units total) into the skin daily. 10 mL 11  . Insulin Pen Needle (BD PEN NEEDLE MICRO U/F) 32G X 6 MM MISC Four times daily 150 each 6  . Insulin Pen Needle (BD PEN NEEDLE MICRO U/F) 32G X 6 MM MISC 4 times a day 150 each 11  . Lancets (ONETOUCH ULTRASOFT) lancets Use as instructed to test blood sugars 4 times daily DX E11.42 100 each 12  . levothyroxine (SYNTHROID, LEVOTHROID) 175 MCG tablet Take 1 tablet (175 mcg total) by mouth daily. 30 tablet 2  . metolazone (ZAROXOLYN) 2.5 MG tablet Take 1 tablet (2.5 mg total) by mouth daily. 30 tablet 3  . multivitamin (ONE-A-DAY MEN'S) TABS tablet Take 1 tablet by mouth daily.     [name]  OXYGEN Inhale 2-3 L/min into the lungs continuous.     . polyethylene glycol (MIRALAX / GLYCOLAX) packet Take 17 g by mouth daily as needed. For constipation (Patient taking differently: Take 17 g by mouth at bedtime. ) 14 each 0  . potassium chloride SA (K-DUR) 20 MEQ tablet Take 1 tablet (20 mEq total) by mouth 2 (two) times daily. Take 1 tab only on days you take Metolazone (Patient taking differently: Take 20 mEq by mouth 3 (three) times daily. ) 60 tablet 0  . pramipexole (MIRAPEX) 0.5 MG tablet One tablet in the morning and 2 in the evening (Patient taking differently: Take 0.5-1 mg by mouth See admin instructions. Take 0.5 mg by mouth in the morning and 1 mg at bedtime) 270 tablet 1  . spironolactone (ALDACTONE) 25 MG tablet Take 1 tablet (25 mg total) by mouth daily. 90 tablet 3  . tamsulosin (FLOMAX) 0.4 MG CAPS capsule Take 1 capsule (0.4 mg total) by mouth daily. (Patient taking differently: Take 0.4 mg by mouth at bedtime. ) 30 capsule 0  . torsemide (DEMADEX) 100 MG tablet Take 1 tablet (100 mg total) by mouth 2 (two) times daily. 180 tablet 3  . TOVIAZ 8 MG TB24 tablet Take 1 tablet (8 mg total) by mouth daily. (Patient taking differently: Take 8 mg by mouth  at bedtime. ) 30 tablet 11   No current facility-administered medications for this encounter.     Allergies:   Amlodipine, Plendil [felodipine], Compazine [prochlorperazine], Cymbalta [duloxetine hcl], Jardiance [empagliflozin], Other, Levofloxacin in d5w, and Septra [sulfamethoxazole-trimethoprim]   Social History:  The patient  reports that he quit smoking about 34 years ago. His smoking use included cigarettes. He has a 40.00 pack-year smoking history. He quit smokeless tobacco use about 34 years ago. He reports that he does not drink alcohol or use drugs.   Family History:  The patient's family history includes CAD in an other family member; Diabetes in his father; Heart disease in his father; Hypertension in his father; Polycythemia in his mother.   ROS:  Please see the history of present illness.   All other systems are personally reviewed and negative.    Wt Readings from Last 3 Encounters:  09/17/19 133.7 kg (294 lb 12.1 oz)  09/08/19 129.3 kg (285 lb)  08/31/19 133 kg (293 lb 3.2 oz)   Today's Vitals   09/22/19 1204  BP: 136/70  Pulse: 74  SpO2: 90%   There is no height or weight on file to calculate BMI. Unable to stand weigh today.   Exam:   General:  Sitting in the chair.  No resp difficulty HEENT: normal Neck: supple. no JVD. Carotids 2+ bilat; no bruits. No lymphadenopathy or thryomegaly appreciated. Cor: PMI nondisplaced. Regular rate & rhythm. No rubs, gallops or murmurs. Lungs: clear Abdomen: soft, nontender, nondistended. No hepatosplenomegaly. No bruits or masses. Good bowel sounds. Extremities: no cyanosis, clubbing, rash, R and LLE  edema Neuro: alert & orientedx3, cranial nerves grossly intact. moves all 4 extremities w/o difficulty. Affect pleasant   Recent Labs: 07/21/2019: Magnesium 3.1 09/15/2019: B Natriuretic Peptide 28.6 09/16/2019: ALT 18; TSH 2.222 09/17/2019: BUN 113; Creatinine, Ser 2.35; Hemoglobin 13.1; Platelets 211; Potassium 4.8; Sodium 133   Personally reviewed  Wt Readings from Last 3 Encounters:  09/17/19 133.7 kg (294 lb 12.1 oz)  09/08/19 129.3 kg (285 lb)  08/31/19 133 kg (293 lb 3.2 oz)    ASSESSMENT AND PLAN:  1.Chronic Diastolic Heart Failure with R>>L symptoms -  ECHO 06/29/2018 EF 55-60% Grade IDD  - Suspect due to R-sided HF from OSA, OHS and COPD - PYP scan 10/2018 with Grade 1, H/CLL 1.45. Reviewed by Dr. Mauricia Area determined to be negative.Myeloma panel with no M-spike, normal IFE. - Echo 5/4 EF 60-65%, normal RV - Volume status status looks pretty good today.  Continue torsemide 100 mg twice a day + 2.5 mg metolazone daily. Check BMET today.  - Intolerant to [place] with UTI and rash.  2.Chronic Hypoxic Respiratory Failure/COPD - He is not using oxygen at home and I have asked him to wear oxygen continuously. .  Continue BiPAP qHS with 3 L O2.  -Continue nightly.   3. CKD Stage III Creatinine baseline ~1.8  Check BMET today.   4. OSA - Continue Bipap nightly.   5. CAD - Non-obstructive. LHC 08/2015: LM 40, LAD distal 40, LCx ostial 50, RCA proximal 40 No chest pain.  - On ASA/statin - Off b-blocker   6. DM - Uncontrolled. A1C is 10.0 -Intolerant to [place] with UTI and rash. - Per PCP.  7.Deconditioning - Followed by [facility]  8. HTN Stable.     Follow up in 2 weeks and in 4 week.   Darnell Level, NP  09/22/2019 12:11 PM   [facility] [address] [facility] [place] [address] [phone] (office) [phone] (fax)

## 2019-09-22 NOTE — Patient Instructions (Signed)
Lab work done today. We will notify you of any abnormal lab work. No news is good news!  Please follow up with the [facility] in 2 weeks and again in 4 weeks.  At the [facility], you and your health needs are our priority. As part of our continuing mission to provide you with exceptional heart care, we have created designated Provider Care Teams. These Care Teams include your primary Cardiologist (physician) and Advanced Practice Providers (APPs- Physician Assistants and Nurse Practitioners) who all work together to provide you with the care you need, when you need it.   You may see any of the following providers on your designated Care Team at your next follow up: [name] Dr Glori Bickers . Dr Loralie Champagne . Darrick Grinder, NP   Please be sure to bring in all your medications bottles to every appointment.

## 2019-09-23 ENCOUNTER — Telehealth: Payer: Self-pay | Admitting: Endocrinology

## 2019-09-23 ENCOUNTER — Telehealth (HOSPITAL_COMMUNITY): Payer: Self-pay

## 2019-09-23 NOTE — Telephone Encounter (Signed)
Spoke with pt and wife about lab work. Pt verbalized agreement to following up with pcp for high blood sugar.

## 2019-09-23 NOTE — Telephone Encounter (Signed)
Per wife patient was recently in the hospital and took a 3 day course of Prednisone.  Blood sugars have been elevated into the 300's and 500's.  Patients wife states that after seeing the heart failure doctor yesterday it was recommended that the patient contact this office to find out how to treat current highs.  -

## 2019-09-23 NOTE — Telephone Encounter (Signed)
-----   Message from Conrad Tatums, NP sent at 09/22/2019  2:27 PM EDT ----- Renal function stable. Please ask him to call PCP for follow regarding uncontrolled diabetes.

## 2019-09-26 ENCOUNTER — Other Ambulatory Visit: Payer: Self-pay

## 2019-09-26 ENCOUNTER — Other Ambulatory Visit (HOSPITAL_COMMUNITY): Payer: Self-pay | Admitting: *Deleted

## 2019-09-26 MED ORDER — POTASSIUM CHLORIDE CRYS ER 20 MEQ PO TBCR: EXTENDED_RELEASE_TABLET | ORAL | 3 refills | Status: AC

## 2019-09-26 MED ORDER — TORSEMIDE 100 MG PO TABS: 100.0000 mg | ORAL_TABLET | Freq: Two times a day (BID) | ORAL | 3 refills | Status: AC

## 2019-09-26 NOTE — Telephone Encounter (Signed)
Pt scheduled for 09/28/19 @ 8:50 and instructed to bring his meter

## 2019-09-26 NOTE — Telephone Encounter (Signed)
Sent to me by mistake 

## 2019-09-26 NOTE — Telephone Encounter (Signed)
Last seen by Dr. Maretta Bees in 4/20

## 2019-09-27 DIAGNOSIS — L89152 Pressure ulcer of sacral region, stage 2: Secondary | ICD-10-CM | POA: Diagnosis not present

## 2019-09-27 DIAGNOSIS — L97822 Non-pressure chronic ulcer of other part of left lower leg with fat layer exposed: Secondary | ICD-10-CM | POA: Diagnosis not present

## 2019-09-27 DIAGNOSIS — L97812 Non-pressure chronic ulcer of other part of right lower leg with fat layer exposed: Secondary | ICD-10-CM | POA: Diagnosis not present

## 2019-09-28 ENCOUNTER — Encounter: Payer: Self-pay | Admitting: Internal Medicine

## 2019-09-28 ENCOUNTER — Ambulatory Visit (INDEPENDENT_AMBULATORY_CARE_PROVIDER_SITE_OTHER): Payer: PPO | Admitting: Internal Medicine

## 2019-09-28 ENCOUNTER — Other Ambulatory Visit: Payer: Self-pay

## 2019-09-28 VITALS — BP 124/60 | HR 81 | Temp 98.1°F | Ht 73.0 in | Wt 285.0 lb

## 2019-09-28 DIAGNOSIS — E1165 Type 2 diabetes mellitus with hyperglycemia: Secondary | ICD-10-CM | POA: Diagnosis not present

## 2019-09-28 DIAGNOSIS — E1142 Type 2 diabetes mellitus with diabetic polyneuropathy: Secondary | ICD-10-CM | POA: Diagnosis not present

## 2019-09-28 DIAGNOSIS — Z23 Encounter for immunization: Secondary | ICD-10-CM | POA: Diagnosis not present

## 2019-09-28 LAB — GLUCOSE, POCT (MANUAL RESULT ENTRY): POC Glucose: 286 mg/dl — AB (ref 70–99)

## 2019-09-28 MED ORDER — NOVOLOG FLEXPEN 100 UNIT/ML ~~LOC~~ SOPN: 22.0000 [IU] | PEN_INJECTOR | Freq: Three times a day (TID) | SUBCUTANEOUS | 3 refills | Status: AC

## 2019-09-28 MED ORDER — INSULIN PEN NEEDLE 32G X 4 MM MISC: 1.0000 | Freq: Four times a day (QID) | 3 refills | Status: AC

## 2019-09-28 MED ORDER — LANTUS SOLOSTAR 100 UNIT/ML ~~LOC~~ SOPN: 60.0000 [IU] | PEN_INJECTOR | Freq: Every day | SUBCUTANEOUS | 3 refills | Status: AC

## 2019-09-28 NOTE — Patient Instructions (Signed)
-   Increase Lantus to 72 units  Daily  - Increase Novolog to 22 units daily  - Check sugar 4 times day (before meals and bedtime)   - HOW TO TREAT LOW BLOOD SUGARS (Blood sugar LESS THAN 70 MG/DL)  Please follow the RULE OF 15 for the treatment of hypoglycemia treatment (when your (blood sugars are less than 70 mg/dL)    STEP 1: Take 15 grams of carbohydrates when your blood sugar is low, which includes:   3-4 GLUCOSE TABS  OR  3-4 OZ OF JUICE OR REGULAR SODA OR  ONE TUBE OF GLUCOSE GEL     STEP 2: RECHECK blood sugar in 15 MINUTES STEP 3: If your blood sugar is still low at the 15 minute recheck --> then, go back to STEP 1 and treat AGAIN with another 15 grams of carbohydrates.

## 2019-09-28 NOTE — Progress Notes (Signed)
Name: Travis [name]  Age/ Sex: 73 y.o., male   MRN/ DOB: [id number], August 22, 1946     PCP: [name], MD   Reason for Endocrinology Evaluation: Type 2 Diabetes Mellitus  Initial Endocrine Consultative Visit: 12/31/2018    PATIENT IDENTIFIER: Travis [name] is a 73 y.o. male with a past medical history of OSA, RLS, L9JQ, diastolic CHF and postablative hypothyroidism.  The patient has followed with Endocrinology clinic since 12/31/2018 for consultative assistance with management of his diabetes.  DIABETIC HISTORY:  Travis [name] was diagnosed with T2DM in 2017, he has been on ozempic and glipizide with persistent hyperglycemia,Jardiance caused recurrent genital infections,  insulin was started in December, 2019. His hemoglobin A1c has ranged from 6.7% in 2016, peaking at 11.3%  In 11/21/18   SUBJECTIVE:   During the last visit (03/04/2019): We increased lantus  and novolog .   Today (09/28/2019): Travis [name] is here for a follow up on diabetes management. Since his last visit here, he has been to the ED in 08/2019 for evaluation of chest pain and SOB. He was given steroid during that hospitalization and noted hyperglycemia since discharge.  He checks his blood sugars 3 times daily, before meals. The patient has not had hypoglycemic episodes since the last clinic visit.  Otherwise, the patient has not required any recent emergency interventions for hypoglycemia and has not had recent hospitalizations secondary to hyper or hypoglycemic episodes.     ROS: As per HPI and as detailed below: Review of Systems  Constitutional: Negative for fever and weight loss.  HENT: Negative for congestion and sore throat.   Respiratory: Negative for cough and shortness of breath.   Cardiovascular: Negative for chest pain, palpitations and leg swelling.  Genitourinary: Positive for frequency.  Endo/Heme/Allergies: Negative for polydipsia.      HOME DIABETES REGIMEN:  Lantus 60 units daily   Novolog 18 units TIDQAC   GLUCOSE LOG : 200-500  HISTORY:  Past Medical History:  Past Medical History:  Diagnosis Date  . Cervical myelopathy (HCC)    with myelomalacia at C5-6 and C3-4  . CHF (congestive heart failure) ([place])   . Constipation - functional    controlled with miralax  . COPD (chronic obstructive pulmonary disease) (HCC)    mild  . DJD (degenerative joint disease)    WHOLE SPINE  . Dyspnea   . Gait disorder   . Heart murmur    years ago   . Hypertension   . Hypothyroidism    Low d/t treatment of hyperthyroidism  . Lumbosacral spinal stenosis 03/13/2014  . On home oxygen therapy    "2.5L all the time" (06/29/2018)  . Peripheral neuropathy   . Pneumonia   . PONV (postoperative nausea and vomiting)   . RBBB   . Renal disorder    acute kidney failure  . Restless legs syndrome 03/14/2015  . RLS (restless legs syndrome)   . Sleep apnea    DX  2009/2010 study done at [facility]  . 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 Type II diabetes mellitus ([place])    diet controlled   Past Surgical History:  Past Surgical History:  Procedure Laterality Date  . BACK SURGERY     6 surgeries  . CARDIAC CATHETERIZATION [name] 09/07/2015   Procedure: Right/Left Heart Cath and Coronary Angiography;  Surgeon: Peter M Martinique, MD;  Location: [facility];  Service: Cardiovascular;  Laterality: [name];  .  CHOLECYSTECTOMY    . EYE SURGERY     BIL CATARACTS  . LUMBAR LAMINECTOMY/DECOMPRESSION MICRODISCECTOMY  07/14/2012   Procedure: LUMBAR LAMINECTOMY/DECOMPRESSION MICRODISCECTOMY 2 LEVELS;  Surgeon: Eustace Moore, MD;  Location: [facility];  Service: Neurosurgery;  Laterality: Left;  Lumbar two three laminectomy, left thoracic four five transpedicular diskectomy  . POSTERIOR CERVICAL FUSION/FORAMINOTOMY  04/08/2018   Procedure: Laminectomy and Foraminotomy - Lumbar Four-Lumbar Five, instrumented posterolateral fusion Lumbar Four- Five, Laminectomy and Foraminotomy - Thoracic  One-Thoracic Two;  Surgeon: Eustace Moore, MD;  Location: [facility] OR;  Service: Neurosurgery;;  posterior  . POSTERIOR LUMBAR FUSION  04/08/2018   Laminectomy and Foraminotomy - Lumbar Four-Lumbar Five, instrumented posterolateral fusion Lumbar Four- Five, Laminectomy and Foraminotomy - Thoracic One-Thoracic Two  . POSTERIOR LUMBAR FUSION  04/08/2018   Laminectomy and Foraminotomy - Lumbar Four-Lumbar Five, instrumented posterolateral fusion Lumbar Four- Five, Laminectomy and Foraminotomy - Thoracic One-Thoracic Two  . RIGHT HEART CATH [name] 07/18/2019   Procedure: RIGHT HEART CATH;  Surgeon: Jolaine Artist, MD;  Location: [facility];  Service: Cardiovascular;  Laterality: [name];    Social History:  reports that he quit smoking about 34 years ago. His smoking use included cigarettes. He has a 40.00 pack-year smoking history. He quit smokeless tobacco use about 34 years ago. He reports that he does not drink alcohol or use drugs. Family History:  Family History  Problem Relation Age of Onset  . Polycythemia Mother   . Diabetes Father   . Hypertension Father   . Heart disease Father   . CAD Other        1 of his 4 brothers and father has CAD     HOME MEDICATIONS: Allergies as of 09/28/2019      Reactions   Amlodipine Swelling, Other (See Comments)   Causes edema (per [facility] paperwork)   Plendil [felodipine] Swelling, Other (See Comments)   Causes edema (per [facility] paperwork)   Compazine [prochlorperazine] Other (See Comments)   Caused agitation (per [facility] paperwork)   Cymbalta [duloxetine Hcl] Other (See Comments)   Made the patient "feel weird," per [facility] paperwork   Jardiance [empagliflozin] Other (See Comments)   Stopped by MD because of unwanted side effects   Other Nausea Only, Other (See Comments)   ANESTHESIA UNSPECIFIED- makes sick, must have anti-nausea medication in advance   Levofloxacin In D5w Itching   Possible infusion  reaction 03/29/17. Tolerating oral Levaquin 03/30/17   Septra [sulfamethoxazole-trimethoprim] Nausea Only, Other (See Comments)   Per [facility] paperwork      Medication List       Accurate as of September 28, 2019  9:36 AM. If you have any questions, ask your nurse or doctor.        aspirin EC 81 MG tablet Take 81 mg by mouth daily.   atorvastatin 20 MG tablet Commonly known as: LIPITOR Take 20 mg by mouth every evening.   cefdinir 300 MG capsule Commonly known as: OMNICEF Take 300 mg by mouth 2 (two) times daily.   escitalopram 20 MG tablet Commonly known as: LEXAPRO Take 20 mg by mouth daily.   FreeStyle Libre 14 Day Reader Kerrin Mo 1 kit by Does not apply route 3 (three) times daily.   FreeStyle Libre 14 Day Sensor Misc 1 kit by Does not apply route 3 (three) times daily.   gabapentin 400 MG capsule Commonly known as: NEURONTIN Take 400 mg by mouth 3 (three) times daily.  glucose blood test strip Use as instructed to test blood sugars 4 times daily DX E11.42   hydrALAZINE 25 MG tablet Commonly known as: APRESOLINE Take 1.5 tablets (37.5 mg total) by mouth 3 (three) times daily.   Insulin Pen Needle 32G X 4 MM Misc 1 Device by Does not apply route 4 (four) times daily. What changed:   medication strength  how much to take  how to take this  when to take this  additional instructions  Another medication with the same name was removed. Continue taking this medication, and follow the directions you see here. Changed by: Dorita Sciara, MD   Lantus SoloStar 100 UNIT/ML Solostar Pen Generic drug: Insulin Glargine Inject 60 Units into the skin at bedtime. What changed: Another medication with the same name was removed. Continue taking this medication, and follow the directions you see here. Changed by: Dorita Sciara, MD   levothyroxine 175 MCG tablet Commonly known as: SYNTHROID Take 1 tablet (175 mcg total) by mouth daily.   metolazone  2.5 MG tablet Commonly known as: ZAROXOLYN Take 1 tablet (2.5 mg total) by mouth daily.   multivitamin Tabs tablet Take 1 tablet by mouth daily.   NovoLOG FlexPen 100 UNIT/ML FlexPen Generic drug: insulin aspart Inject 22 Units into the skin 3 (three) times daily with meals. What changed: how much to take Changed by: Dorita Sciara, MD   onetouch ultrasoft lancets Use as instructed to test blood sugars 4 times daily DX E11.42   OneTouch Verio w/Device Kit 1 kit by Does not apply route 4 (four) times daily. Use as directed to test blood sugars 4 times daily DX E11.42   OXYGEN Inhale 2-3 L/min into the lungs continuous.   polyethylene glycol 17 g packet Commonly known as: MIRALAX / GLYCOLAX Take 17 g by mouth daily as needed. For constipation What changed:   when to take this  additional instructions   potassium chloride SA 20 MEQ tablet Commonly known as: KLOR-CON Take 1 tablet by mouth three times daily.   pramipexole 0.5 MG tablet Commonly known as: MIRAPEX One tablet in the morning and 2 in the evening What changed:   how much to take  how to take this  when to take this  additional instructions   spironolactone 25 MG tablet Commonly known as: ALDACTONE Take 1 tablet (25 mg total) by mouth daily.   tamsulosin 0.4 MG Caps capsule Commonly known as: FLOMAX Take 1 capsule (0.4 mg total) by mouth daily. What changed: when to take this   torsemide 100 MG tablet Commonly known as: DEMADEX Take 1 tablet (100 mg total) by mouth 2 (two) times daily.   Toviaz 8 MG Tb24 tablet Generic drug: fesoterodine Take 1 tablet (8 mg total) by mouth daily. What changed: when to take this        DATA REVIEWED:  Lab Results  Component Value Date   HGBA1C 9.4 (H) 09/16/2019   HGBA1C 10.1 (H) 05/04/2019   HGBA1C 10.0 (H) 05/02/2019      ASSESSMENT / PLAN / RECOMMENDATIONS:   1) Type 2 Diabetes Mellitus, poorly controlled, With neuropathic and renal  (CKD III) complications - Most recent A1c of 9.4 %. Goal A1c < 7.0 %.    - Pt continues with hyperglycemia despite improvement in his A1c.  -Patient with severe insulin resistant this is partly due to his weight and edema.  -He is intolerant to SGLT-2 inhibitors, he has tried Bydureon in the past with  persistent hyperglycemia. He is not a candidate for metformin due to low GFR   MEDICATIONS:  Increase Lantus to 72 units daily  Increase NovoLog to 22 units with each meal  EDUCATION / INSTRUCTIONS:  BG monitoring instructions: Patient is instructed to check his blood sugars 4 times a day, before meals and at bedtime.  Call [facility] if: BG persistently < 70 or > 300. . I reviewed the Rule of 15 for the treatment of hypoglycemia in detail with the patient. Literature supplied.    F/U in 3 months   Signed electronically by: Mack Guise, MD  [facility] End[facility]  [facility] [address] [place], [place] [address] Phone: [phone] FAX: [phone]   CC: [name], MD [address] [place] [address] Phone: [phone]  Fax: [phone]  Return to Endocrinology clinic as below: Future Appointments  Date Time Provider [place]  10/06/2019  9:30 AM [facility] PA/NP [facility] None  11/04/2019  9:40 AM Bensimhon, Shaune Pascal, MD [facility] None  01/04/2020  9:30 AM Jessicca Stitzer, Melanie Crazier, MD [facility] None

## 2019-09-29 DIAGNOSIS — I5033 Acute on chronic diastolic (congestive) heart failure: Secondary | ICD-10-CM | POA: Diagnosis not present

## 2019-09-29 DIAGNOSIS — G9340 Encephalopathy, unspecified: Secondary | ICD-10-CM | POA: Diagnosis not present

## 2019-09-29 DIAGNOSIS — M4800 Spinal stenosis, site unspecified: Secondary | ICD-10-CM | POA: Diagnosis not present

## 2019-09-30 DIAGNOSIS — R300 Dysuria: Secondary | ICD-10-CM | POA: Diagnosis not present

## 2019-10-03 ENCOUNTER — Emergency Department (HOSPITAL_COMMUNITY): Payer: PPO

## 2019-10-03 ENCOUNTER — Other Ambulatory Visit: Payer: Self-pay

## 2019-10-03 ENCOUNTER — Inpatient Hospital Stay (HOSPITAL_COMMUNITY)
Admission: EM | Admit: 2019-10-03 | Discharge: 2019-10-12 | DRG: 291 | Disposition: A | Discharge status: Hospice | Payer: PPO | Attending: Student | Admitting: Student

## 2019-10-03 ENCOUNTER — Encounter (HOSPITAL_COMMUNITY): Payer: Self-pay

## 2019-10-03 DIAGNOSIS — Z981 Arthrodesis status: Secondary | ICD-10-CM

## 2019-10-03 DIAGNOSIS — N390 Urinary tract infection, site not specified: Secondary | ICD-10-CM | POA: Diagnosis present

## 2019-10-03 DIAGNOSIS — J9622 Acute and chronic respiratory failure with hypercapnia: Secondary | ICD-10-CM | POA: Diagnosis not present

## 2019-10-03 DIAGNOSIS — G4733 Obstructive sleep apnea (adult) (pediatric): Secondary | ICD-10-CM | POA: Diagnosis present

## 2019-10-03 DIAGNOSIS — Z6841 Body Mass Index (BMI) 40.0 and over, adult: Secondary | ICD-10-CM

## 2019-10-03 DIAGNOSIS — Z9981 Dependence on supplemental oxygen: Secondary | ICD-10-CM

## 2019-10-03 DIAGNOSIS — N179 Acute kidney failure, unspecified: Secondary | ICD-10-CM | POA: Diagnosis present

## 2019-10-03 DIAGNOSIS — E66813 Obesity, class 3: Secondary | ICD-10-CM | POA: Diagnosis present

## 2019-10-03 DIAGNOSIS — E039 Hypothyroidism, unspecified: Secondary | ICD-10-CM | POA: Diagnosis present

## 2019-10-03 DIAGNOSIS — E890 Postprocedural hypothyroidism: Secondary | ICD-10-CM | POA: Diagnosis not present

## 2019-10-03 DIAGNOSIS — I129 Hypertensive chronic kidney disease with stage 1 through stage 4 chronic kidney disease, or unspecified chronic kidney disease: Secondary | ICD-10-CM | POA: Diagnosis not present

## 2019-10-03 DIAGNOSIS — R0602 Shortness of breath: Secondary | ICD-10-CM | POA: Diagnosis not present

## 2019-10-03 DIAGNOSIS — I130 Hypertensive heart and chronic kidney disease with heart failure and stage 1 through stage 4 chronic kidney disease, or unspecified chronic kidney disease: Principal | ICD-10-CM | POA: Diagnosis present

## 2019-10-03 DIAGNOSIS — L89153 Pressure ulcer of sacral region, stage 3: Secondary | ICD-10-CM | POA: Diagnosis present

## 2019-10-03 DIAGNOSIS — N183 Chronic kidney disease, stage 3 unspecified: Secondary | ICD-10-CM | POA: Diagnosis not present

## 2019-10-03 DIAGNOSIS — Z8701 Personal history of pneumonia (recurrent): Secondary | ICD-10-CM

## 2019-10-03 DIAGNOSIS — R069 Unspecified abnormalities of breathing: Secondary | ICD-10-CM | POA: Diagnosis not present

## 2019-10-03 DIAGNOSIS — I5031 Acute diastolic (congestive) heart failure: Secondary | ICD-10-CM | POA: Diagnosis not present

## 2019-10-03 DIAGNOSIS — I5030 Unspecified diastolic (congestive) heart failure: Secondary | ICD-10-CM | POA: Diagnosis not present

## 2019-10-03 DIAGNOSIS — Z9119 Patient's noncompliance with other medical treatment and regimen: Secondary | ICD-10-CM

## 2019-10-03 DIAGNOSIS — R140 Abdominal distension (gaseous): Secondary | ICD-10-CM | POA: Diagnosis present

## 2019-10-03 DIAGNOSIS — Z79899 Other long term (current) drug therapy: Secondary | ICD-10-CM

## 2019-10-03 DIAGNOSIS — J984 Other disorders of lung: Secondary | ICD-10-CM | POA: Diagnosis present

## 2019-10-03 DIAGNOSIS — F329 Major depressive disorder, single episode, unspecified: Secondary | ICD-10-CM | POA: Diagnosis present

## 2019-10-03 DIAGNOSIS — T380X5A Adverse effect of glucocorticoids and synthetic analogues, initial encounter: Secondary | ICD-10-CM | POA: Diagnosis not present

## 2019-10-03 DIAGNOSIS — R269 Unspecified abnormalities of gait and mobility: Secondary | ICD-10-CM | POA: Diagnosis present

## 2019-10-03 DIAGNOSIS — J9601 Acute respiratory failure with hypoxia: Secondary | ICD-10-CM | POA: Diagnosis present

## 2019-10-03 DIAGNOSIS — Z7401 Bed confinement status: Secondary | ICD-10-CM

## 2019-10-03 DIAGNOSIS — Z8249 Family history of ischemic heart disease and other diseases of the circulatory system: Secondary | ICD-10-CM

## 2019-10-03 DIAGNOSIS — B965 Pseudomonas (aeruginosa) (mallei) (pseudomallei) as the cause of diseases classified elsewhere: Secondary | ICD-10-CM | POA: Diagnosis present

## 2019-10-03 DIAGNOSIS — J9621 Acute and chronic respiratory failure with hypoxia: Secondary | ICD-10-CM | POA: Diagnosis present

## 2019-10-03 DIAGNOSIS — Z20828 Contact with and (suspected) exposure to other viral communicable diseases: Secondary | ICD-10-CM | POA: Diagnosis present

## 2019-10-03 DIAGNOSIS — L89819 Pressure ulcer of head, unspecified stage: Secondary | ICD-10-CM | POA: Diagnosis present

## 2019-10-03 DIAGNOSIS — L0390 Cellulitis, unspecified: Secondary | ICD-10-CM | POA: Diagnosis present

## 2019-10-03 DIAGNOSIS — N184 Chronic kidney disease, stage 4 (severe): Secondary | ICD-10-CM | POA: Diagnosis present

## 2019-10-03 DIAGNOSIS — Z833 Family history of diabetes mellitus: Secondary | ICD-10-CM

## 2019-10-03 DIAGNOSIS — Z743 Need for continuous supervision: Secondary | ICD-10-CM | POA: Diagnosis not present

## 2019-10-03 DIAGNOSIS — J441 Chronic obstructive pulmonary disease with (acute) exacerbation: Secondary | ICD-10-CM | POA: Diagnosis present

## 2019-10-03 DIAGNOSIS — Z7982 Long term (current) use of aspirin: Secondary | ICD-10-CM

## 2019-10-03 DIAGNOSIS — E662 Morbid (severe) obesity with alveolar hypoventilation: Secondary | ICD-10-CM | POA: Diagnosis present

## 2019-10-03 DIAGNOSIS — J80 Acute respiratory distress syndrome: Secondary | ICD-10-CM | POA: Diagnosis not present

## 2019-10-03 DIAGNOSIS — Z1623 Resistance to quinolones and fluoroquinolones: Secondary | ICD-10-CM | POA: Diagnosis present

## 2019-10-03 DIAGNOSIS — Z66 Do not resuscitate: Secondary | ICD-10-CM | POA: Diagnosis present

## 2019-10-03 DIAGNOSIS — Z794 Long term (current) use of insulin: Secondary | ICD-10-CM

## 2019-10-03 DIAGNOSIS — G8250 Quadriplegia, unspecified: Secondary | ICD-10-CM | POA: Diagnosis present

## 2019-10-03 DIAGNOSIS — E1142 Type 2 diabetes mellitus with diabetic polyneuropathy: Secondary | ICD-10-CM | POA: Diagnosis present

## 2019-10-03 DIAGNOSIS — I509 Heart failure, unspecified: Secondary | ICD-10-CM

## 2019-10-03 DIAGNOSIS — E785 Hyperlipidemia, unspecified: Secondary | ICD-10-CM | POA: Diagnosis present

## 2019-10-03 DIAGNOSIS — Z882 Allergy status to sulfonamides status: Secondary | ICD-10-CM

## 2019-10-03 DIAGNOSIS — Z515 Encounter for palliative care: Secondary | ICD-10-CM | POA: Diagnosis present

## 2019-10-03 DIAGNOSIS — E874 Mixed disorder of acid-base balance: Secondary | ICD-10-CM | POA: Diagnosis present

## 2019-10-03 DIAGNOSIS — I2510 Atherosclerotic heart disease of native coronary artery without angina pectoris: Secondary | ICD-10-CM | POA: Diagnosis present

## 2019-10-03 DIAGNOSIS — R0603 Acute respiratory distress: Secondary | ICD-10-CM | POA: Diagnosis not present

## 2019-10-03 DIAGNOSIS — I4510 Unspecified right bundle-branch block: Secondary | ICD-10-CM | POA: Diagnosis present

## 2019-10-03 DIAGNOSIS — R410 Disorientation, unspecified: Secondary | ICD-10-CM | POA: Diagnosis not present

## 2019-10-03 DIAGNOSIS — J9811 Atelectasis: Secondary | ICD-10-CM | POA: Diagnosis present

## 2019-10-03 DIAGNOSIS — I5033 Acute on chronic diastolic (congestive) heart failure: Secondary | ICD-10-CM | POA: Diagnosis present

## 2019-10-03 DIAGNOSIS — Z888 Allergy status to other drugs, medicaments and biological substances status: Secondary | ICD-10-CM | POA: Diagnosis not present

## 2019-10-03 DIAGNOSIS — Z87891 Personal history of nicotine dependence: Secondary | ICD-10-CM

## 2019-10-03 DIAGNOSIS — N400 Enlarged prostate without lower urinary tract symptoms: Secondary | ICD-10-CM | POA: Diagnosis not present

## 2019-10-03 DIAGNOSIS — L89152 Pressure ulcer of sacral region, stage 2: Secondary | ICD-10-CM | POA: Diagnosis not present

## 2019-10-03 DIAGNOSIS — E1129 Type 2 diabetes mellitus with other diabetic kidney complication: Secondary | ICD-10-CM | POA: Diagnosis not present

## 2019-10-03 DIAGNOSIS — Z79890 Hormone replacement therapy (postmenopausal): Secondary | ICD-10-CM

## 2019-10-03 DIAGNOSIS — E1165 Type 2 diabetes mellitus with hyperglycemia: Secondary | ICD-10-CM | POA: Diagnosis not present

## 2019-10-03 DIAGNOSIS — E1122 Type 2 diabetes mellitus with diabetic chronic kidney disease: Secondary | ICD-10-CM | POA: Diagnosis present

## 2019-10-03 DIAGNOSIS — I5032 Chronic diastolic (congestive) heart failure: Secondary | ICD-10-CM | POA: Diagnosis not present

## 2019-10-03 DIAGNOSIS — M1990 Unspecified osteoarthritis, unspecified site: Secondary | ICD-10-CM | POA: Diagnosis present

## 2019-10-03 DIAGNOSIS — M4807 Spinal stenosis, lumbosacral region: Secondary | ICD-10-CM | POA: Diagnosis present

## 2019-10-03 LAB — CBC WITH DIFFERENTIAL/PLATELET
Abs Immature Granulocytes: 0.06 10*3/uL (ref 0.00–0.07)
Basophils Absolute: 0 10*3/uL (ref 0.0–0.1)
Basophils Relative: 0 %
Eosinophils Absolute: 0.1 10*3/uL (ref 0.0–0.5)
Eosinophils Relative: 1 %
HCT: 42.8 % (ref 39.0–52.0)
Hemoglobin: 14.1 g/dL (ref 13.0–17.0)
Immature Granulocytes: 1 %
Lymphocytes Relative: 7 %
Lymphs Abs: 0.6 10*3/uL — ABNORMAL LOW (ref 0.7–4.0)
MCH: 30 pg (ref 26.0–34.0)
MCHC: 32.9 g/dL (ref 30.0–36.0)
MCV: 91.1 fL (ref 80.0–100.0)
Monocytes Absolute: 0.5 10*3/uL (ref 0.1–1.0)
Monocytes Relative: 6 %
Neutro Abs: 8 10*3/uL — ABNORMAL HIGH (ref 1.7–7.7)
Neutrophils Relative %: 85 %
Platelets: 218 10*3/uL (ref 150–400)
RBC: 4.7 MIL/uL (ref 4.22–5.81)
RDW: 16.1 % — ABNORMAL HIGH (ref 11.5–15.5)
WBC: 9.3 10*3/uL (ref 4.0–10.5)
nRBC: 0 % (ref 0.0–0.2)

## 2019-10-03 LAB — PROTIME-INR
INR: 1 (ref 0.8–1.2)
Prothrombin Time: 13.4 seconds (ref 11.4–15.2)

## 2019-10-03 LAB — LACTIC ACID, PLASMA: Lactic Acid, Venous: 1.6 mmol/L (ref 0.5–1.9)

## 2019-10-03 LAB — APTT: aPTT: 23 seconds — ABNORMAL LOW (ref 24–36)

## 2019-10-03 MED ORDER — SODIUM CHLORIDE 0.9 % IV SOLN
500.0000 mg | INTRAVENOUS | Status: DC
  Administered 2019-10-04 (×2): 500 mg via INTRAVENOUS
  Filled 2019-10-03 (×2): qty 500

## 2019-10-03 MED ORDER — SODIUM CHLORIDE 0.9 % IV SOLN
2.0000 g | INTRAVENOUS | Status: DC
  Administered 2019-10-04: 2 g via INTRAVENOUS
  Filled 2019-10-03: qty 20

## 2019-10-03 MED ORDER — FUROSEMIDE 10 MG/ML IJ SOLN
120.0000 mg | INTRAVENOUS | Status: AC
  Administered 2019-10-03: 120 mg via INTRAVENOUS
  Filled 2019-10-03: qty 10

## 2019-10-03 NOTE — ED Provider Notes (Addendum)
[facility] [facility] EMERGENCY DEPARTMENT Provider Note   CSN: [id number] Arrival date & time: 10/03/19  2243     History   Chief Complaint Chief Complaint  Patient presents with  . Respiratory Distress    HPI Travis [name] is a 73 y.o. male.     HPI  The patient has a history of CHF (last EF 37-16% with diastolic dysfunction), COPD on 2L home O2, who presents to the ED with worsening shortness of breath, dyspnea on exertion, hypervolemia with bilateral pitting edema in the lower extremities and ascites. The patient is currently on Metolazone 2.1m qd and Torsemide 1087mbid and has been taking his medications. She states that has has had worsening weight gain and swelling over the past few days and developed acute onset shortness of breath today requiring continued home CPAP administration.   The patient arrived to the ED in acute respiratory distress on a NRB with tachypnea present. The patient reports a history of COPD so a venturi mask was placed. He was placed on 14L and 55% FiO2 with saturations of 97%.   Past Medical History:  Diagnosis Date  . Cervical myelopathy (HCC)    with myelomalacia at C5-6 and C3-4  . CHF (congestive heart failure) (HCAllent[place]  . Constipation - functional    controlled with miralax  . COPD (chronic obstructive pulmonary disease) (HCC)    mild  . DJD (degenerative joint disease)    WHOLE SPINE  . Dyspnea   . Gait disorder   . Heart murmur    years ago   . Hypertension   . Hypothyroidism    Low d/t treatment of hyperthyroidism  . Lumbosacral spinal stenosis 03/13/2014  . On home oxygen therapy    "2.5L all the time" (06/29/2018)  . Peripheral neuropathy   . Pneumonia   . PONV (postoperative nausea and vomiting)   . RBBB   . Renal disorder    acute kidney failure  . Restless legs syndrome 03/14/2015  . RLS (restless legs syndrome)   . Sleep apnea    [facility] 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  Type II diabetes mellitus (HCRamah   diet controlled    Patient Active Problem List   Diagnosis Date Noted  . Pressure injury of sacral region, stage 2 (HCOak Hills09/19/2020  . Obesity hypoventilation syndrome (HCWest Slope09/19/2020  . Wheelchair bound- can only stand and pivot 09/17/2019  . Acute respiratory distress 09/16/2019  . Chest pain 09/16/2019  . Lower extremity edema   . Acute metabolic encephalopathy 0796/78/9381. Acute CHF (congestive heart failure) (HCBurnham07/15/2020  . Anasarca 05/01/2019  . Cellulitis of lower extremity 05/01/2019  . Depression 05/01/2019  . RLS (restless legs syndrome) 05/01/2019  . Type 2 diabetes mellitus with stage 3 chronic kidney disease, with long-term current use of insulin (HCMiddletown04/17/2020  . Insulin dependent diabetes mellitus 04/15/2019  . Pressure injury of skin 11/21/2018  . Complicated urinary tract infection 11/20/2018  . Acute encephalopathy 06/28/2018  . Oxygen desaturation   . Supplemental oxygen dependent   . Type 2 diabetes mellitus with hyperglycemia, with long-term current use of insulin (HCCottonwood  . Labile blood pressure   . Labile blood glucose   . Myelopathy (HCWashakie04/19/2019  . Benign essential HTN   . Benign prostatic hyperplasia with urinary retention   . Acute on chronic respiratory failure with hypoxia and hypercapnia (HCC)   .  DDD (degenerative disc disease), cervical   . Obesity, Class III, BMI 40-49.9 (morbid obesity) ([place])   . S/P lumbar spinal fusion 04/08/2018  . 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 Myelopathy, spondylogenic, cervical 11/11/2017  . Excessive daytime sleepiness 11/11/2017  . 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 Debility 10/08/2017  . Stage 3 chronic kidney disease   . Spinal stenosis of lumbar region   . Acute blood loss anemia   . Urinary tract infection without hematuria   . Acute on chronic diastolic CHF (congestive heart failure) ([place])   . Elevated  troponin I level   . Hypothyroid 08/19/2015  . Restless legs syndrome 03/14/2015  . Shortness of breath 03/09/2015  . Sinus bradycardia 09/21/2014  . Thoracic spondylosis with myelopathy 09/13/2014  . Cervical arthritis with myelopathy 07/18/2014  . Constipation - functional   . Sepsis ([place]) 07/10/2014  . Edema leg 07/10/2014  . Lumbosacral spinal stenosis 03/13/2014  . Other vitamin B12 deficiency anemia 07/13/2013  . 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 Polyneuropathy in other diseases classified elsewhere ([place]) 02/17/2013  . Other general symptoms(780.99) 02/17/2013  . Abnormality of gait 02/17/2013  . Cervical spondylosis with myelopathy 02/17/2013  . Back ache 09/15/2012    Past Surgical History:  Procedure Laterality Date  . BACK SURGERY     6 surgeries  . CARDIAC CATHETERIZATION [name] 09/07/2015   Procedure: Right/Left Heart Cath and Coronary Angiography;  Surgeon: Peter M Martinique, MD;  Location: [facility];  Service: Cardiovascular;  Laterality: [name];  . CHOLECYSTECTOMY    . EYE SURGERY     BIL CATARACTS  . LUMBAR LAMINECTOMY/DECOMPRESSION MICRODISCECTOMY  07/14/2012   Procedure: LUMBAR LAMINECTOMY/DECOMPRESSION MICRODISCECTOMY 2 LEVELS;  Surgeon: Eustace Moore, MD;  Location: [facility];  Service: Neurosurgery;  Laterality: Left;  Lumbar two three laminectomy, left thoracic four five transpedicular diskectomy  . POSTERIOR CERVICAL FUSION/FORAMINOTOMY  04/08/2018   Procedure: Laminectomy and Foraminotomy - Lumbar Four-Lumbar Five, instrumented posterolateral fusion Lumbar Four- Five, Laminectomy and Foraminotomy - Thoracic One-Thoracic Two;  Surgeon: Eustace Moore, MD;  Location: [facility] OR;  Service: Neurosurgery;;  posterior  . POSTERIOR LUMBAR FUSION  04/08/2018   Laminectomy and Foraminotomy - Lumbar Four-Lumbar Five, instrumented posterolateral fusion Lumbar Four- Five, Laminectomy and Foraminotomy - Thoracic One-Thoracic Two  . POSTERIOR  LUMBAR FUSION  04/08/2018   Laminectomy and Foraminotomy - Lumbar Four-Lumbar Five, instrumented posterolateral fusion Lumbar Four- Five, Laminectomy and Foraminotomy - Thoracic One-Thoracic Two  . RIGHT HEART CATH [name] 07/18/2019   Procedure: RIGHT HEART CATH;  Surgeon: Jolaine Artist, MD;  Location: [facility];  Service: Cardiovascular;  Laterality: [name];        Home Medications    Prior to Admission medications   Medication Sig Start Date End Date Taking? Authorizing Provider  aspirin EC 81 MG tablet Take 81 mg by mouth daily.    [provider]  atorvastatin (LIPITOR) 20 MG tablet Take 20 mg by mouth every evening.     [provider]  Blood Glucose Monitoring Suppl (ONETOUCH VERIO) w/Device KIT 1 kit by Does not apply route 4 (four) times daily. Use as directed to test blood sugars 4 times daily DX E11.42 03/04/19   Shamleffer, Melanie Crazier, MD  cefdinir (OMNICEF) 300 MG capsule Take 300 mg by mouth 2 (two) times daily.    [provider]  Continuous Blood Gluc Receiver (FREESTYLE LIBRE 14 DAY  READER) DEVI 1 kit by Does not apply route 3 (three) times daily. Patient not taking: Reported on 09/28/2019 02/07/19   Shamleffer, Melanie Crazier, MD  Continuous Blood Gluc Sensor (FREESTYLE LIBRE 14 DAY SENSOR) MISC 1 kit by Does not apply route 3 (three) times daily. Patient not taking: Reported on 09/28/2019 02/07/19   Shamleffer, Melanie Crazier, MD  escitalopram (LEXAPRO) 20 MG tablet Take 20 mg by mouth daily.    [provider]  gabapentin (NEURONTIN) 400 MG capsule Take 400 mg by mouth 3 (three) times daily.    [provider]  glucose blood test strip Use as instructed to test blood sugars 4 times daily DX E11.42 03/04/19   Shamleffer, Melanie Crazier, MD  hydrALAZINE (APRESOLINE) 25 MG tablet Take 1.5 tablets (37.5 mg total) by mouth 3 (three) times daily. 08/22/19   Clegg, Amy D, NP  insulin aspart (NOVOLOG FLEXPEN) 100 UNIT/ML FlexPen  Inject 22 Units into the skin 3 (three) times daily with meals. 09/28/19   Shamleffer, Melanie Crazier, MD  Insulin Glargine (LANTUS SOLOSTAR) 100 UNIT/ML Solostar Pen Inject 60 Units into the skin at bedtime. 09/28/19   Shamleffer, Melanie Crazier, MD  Insulin Pen Needle 32G X 4 MM MISC 1 Device by Does not apply route 4 (four) times daily. 09/28/19   Shamleffer, Melanie Crazier, MD  Lancets [facility] ULTRASOFT) lancets Use as instructed to test blood sugars 4 times daily DX E11.42 03/04/19   Shamleffer, Melanie Crazier, MD  levothyroxine (SYNTHROID, LEVOTHROID) 175 MCG tablet Take 1 tablet (175 mcg total) by mouth daily. 05/12/18   Angiulli, Lavon Paganini, [name]  metolazone (ZAROXOLYN) 2.5 MG tablet Take 1 tablet (2.5 mg total) by mouth daily. 08/31/19   Lyda Jester M, [name]  multivitamin (ONE-A-DAY MEN'S) TABS tablet Take 1 tablet by mouth daily.     [provider]  OXYGEN Inhale 2-3 L/min into the lungs continuous.     [provider]  polyethylene glycol (MIRALAX / GLYCOLAX) packet Take 17 g by mouth daily as needed. For constipation Patient taking differently: Take 17 g by mouth at bedtime.  08/09/14   Love, Ivan Anchors, [name]  potassium chloride SA (K-DUR) 20 MEQ tablet Take 1 tablet by mouth three times daily. 09/26/19   Clegg, Amy D, NP  pramipexole (MIRAPEX) 0.5 MG tablet One tablet in the morning and 2 in the evening Patient taking differently: Take 0.5-1 mg by mouth See admin instructions. Take 0.5 mg by mouth in the morning and 1 mg at bedtime 11/24/18   Kathrynn Ducking, MD  spironolactone (ALDACTONE) 25 MG tablet Take 1 tablet (25 mg total) by mouth daily. 10/29/18   Nahser, Wonda Cheng, MD  tamsulosin (FLOMAX) 0.4 MG CAPS capsule Take 1 capsule (0.4 mg total) by mouth daily. Patient taking differently: Take 0.4 mg by mouth at bedtime.  05/12/18   Angiulli, Lavon Paganini, [name]  torsemide (DEMADEX) 100 MG tablet Take 1 tablet (100 mg total) by mouth 2 (two) times daily. 09/26/19   Clegg, Amy  D, NP  TOVIAZ 8 MG TB24 tablet Take 1 tablet (8 mg total) by mouth daily. Patient taking differently: Take 8 mg by mouth at bedtime.  05/12/18   Angiulli, Lavon Paganini, [name]    Family History Family History  Problem Relation Age of Onset  . Polycythemia Mother   . Diabetes Father   . Hypertension Father   . Heart disease Father   . CAD Other        1 of his  4 brothers and father has CAD    Social History Social History   Tobacco Use  . Smoking status: Former Smoker    Packs/day: 2.00    Years: 20.00    Pack years: 40.00    Types: Cigarettes    Quit date: 06/28/1985    Years since quitting: 34.2  . Smokeless tobacco: Former Systems developer    Quit date: 06/28/1985  Substance Use Topics  . Alcohol use: No  . Drug use: No     Allergies   Amlodipine, Plendil [felodipine], Compazine [prochlorperazine], Cymbalta [duloxetine hcl], Jardiance [empagliflozin], Other, Levofloxacin in d5w, and Septra [sulfamethoxazole-trimethoprim]   Review of Systems Review of Systems  Constitutional: Positive for unexpected weight change. Negative for diaphoresis and fever.  HENT: Negative for congestion, ear pain, rhinorrhea and sore throat.   Eyes: Negative for pain and visual disturbance.  Respiratory: Positive for shortness of breath. Negative for cough.   Cardiovascular: Positive for leg swelling. Negative for chest pain and palpitations.  Gastrointestinal: Positive for abdominal distention. Negative for abdominal pain, nausea and vomiting.  Genitourinary: Negative for dysuria and hematuria.  Musculoskeletal: Negative for arthralgias and back pain.  Skin: Negative for color change and rash.  Neurological: Negative for seizures and syncope.  All other systems reviewed and are negative.    Physical Exam Updated Vital Signs BP 134/75   Pulse 89   Temp (!) 97 F (36.1 C) (Oral)   Resp (!) 39   SpO2 96%   Physical Exam Vitals signs and nursing note reviewed.  Constitutional:      General: He is  in acute distress.     Appearance: He is well-developed. He is obese. He is ill-appearing. He is not diaphoretic.  HENT:     Head: Normocephalic and atraumatic.     Nose: Nose normal.     Mouth/Throat:     Mouth: Mucous membranes are moist.  Eyes:     Extraocular Movements: Extraocular movements intact.     Conjunctiva/sclera: Conjunctivae normal.     Pupils: Pupils are equal, round, and reactive to light.  Neck:     Musculoskeletal: Neck supple.  Cardiovascular:     Rate and Rhythm: Normal rate and regular rhythm.     Pulses: Normal pulses.     Heart sounds: Gallop present.      Comments: Diminished heart sounds Pulmonary:     Effort: Tachypnea, accessory muscle usage and respiratory distress present.     Breath sounds: Examination of the right-upper field reveals rales. Examination of the left-upper field reveals rales. Examination of the right-middle field reveals rales. Examination of the left-middle field reveals rales. Examination of the right-lower field reveals rales. Examination of the left-lower field reveals rales. Rales present.  Abdominal:     General: There is distension.     Palpations: Abdomen is soft.     Tenderness: There is no abdominal tenderness. There is no guarding.  Musculoskeletal:     Right lower leg: Edema present.     Left lower leg: Edema present.     Comments: Bilateral 3+ pitting edema in the lower extremities  Skin:    General: Skin is warm and dry.     Capillary Refill: Capillary refill takes less than 2 seconds.  Neurological:     General: No focal deficit present.     Mental Status: He is alert and oriented to person, place, and time.     Cranial Nerves: No cranial nerve deficit.     Sensory: No  sensory deficit.     Motor: No weakness.     Coordination: Coordination normal.     Gait: Gait normal.      ED Treatments / Results  Labs (all labs ordered are listed, but only abnormal results are displayed) Labs Reviewed  CULTURE, BLOOD  (ROUTINE X 2)  CULTURE, BLOOD (ROUTINE X 2)  URINE CULTURE  SARS CORONAVIRUS 2 (HOSPITAL ORDER, [place] LAB)  LACTIC ACID, PLASMA  LACTIC ACID, PLASMA  COMPREHENSIVE METABOLIC PANEL  CBC WITH DIFFERENTIAL/PLATELET  APTT  PROTIME-INR  URINALYSIS, ROUTINE W REFLEX MICROSCOPIC  BRAIN NATRIURETIC PEPTIDE  TROPONIN I (HIGH SENSITIVITY)    EKG EKG Interpretation  Date/Time:  Monday October 03 2019 22:46:00 EDT Ventricular Rate:  90 PR Interval:    QRS Duration: 150 QT Interval:  415 QTC Calculation: 508 R Axis:   -13 Text Interpretation:  Sinus rhythm Right bundle branch block Lateral infarct, recent Since last tracing rate faster Confirmed by Noemi Chapel [phone]) on 10/03/2019 11:03:13 PM   Radiology No results found.  Procedures Procedures (including critical care time)  Medications Ordered in ED Medications  furosemide (LASIX) 120 mg in dextrose 5 % 50 mL IVPB (has no administration in time range)  cefTRIAXone (ROCEPHIN) 2 g in sodium chloride 0.9 % 100 mL IVPB (has no administration in time range)  azithromycin (ZITHROMAX) 500 mg in sodium chloride 0.9 % 250 mL IVPB (has no administration in time range)     Initial Impression / Assessment and Plan / ED Course  I have reviewed the triage vital signs and the nursing notes.  Pertinent labs & imaging results that were available during my care of the patient were reviewed by me and considered in my medical decision making (see chart for details).        The patient presents to the Emergency Department in acute respiratory distress requiring O2 via NRB, subsequently placed on a venturi mask. He denies chest pain. A sepsis workup was initiated on arrival.  Ddx: S equelae of obesity hypoventilation syndrome, PE, ACS, PNA, PTX, heart failure, sepsis.  Thought process: the patient is currently on metolazone and torsemide at home and presents with worsening sequelae of volume overload despite  medication administration. He was critically ill on arrival with oxygen saturations less than 90% at home with tachypnea and severe and worsening peripheral edema. He arrived speaking in 2-3 word sentences, on a NRB and subsequently transitioned to a venturi mask. He has a recent admission from Sep 17-19 and was diagnosed with obesity hypoventilation syndrome. He is O2 dependent on 2L O2 at home. His last echocardiogram revealed an EF of 60-65% with potential diastolic dysfunction.   Initial EKG, CXR, and labs pending at time of sign-out. The patient stabilized on the venturi mask. He was covered for CAP with antibiotics on arrival. I signed the patient out to Rodell Perna, [name] at 11:30PM.   Final Clinical Impressions(s) / ED Diagnoses   Final diagnoses:  Respiratory distress  Obesity hypoventilation syndrome [facility])    ED Discharge Orders    None       Regan Lemming, MD 10/03/19 2354    Regan Lemming, MD 10/03/19 2355    Regan Lemming, MD 10/04/19 0105    Noemi Chapel, MD 10/07/19 [phone]

## 2019-10-03 NOTE — ED Provider Notes (Addendum)
Received patient at signout from Dr. Armandina Gemma seen in conjunction with Dr. Sabra Heck.  Refer to provider note for full history and physical examination.  Briefly, patient is a 73 year old male with history of CHF with diastolic dysfunction, COPD on 2 to 2.5 L home O2 at all times, obesity hypoventilation syndrome presenting for evaluation of progressively worsening shortness of breath and peripheral edema for a few days.  Currently on high-dose diuretics and reports compliance with his medications.  Was hypoxic on 2 L to the 80s on EMS arrival, improved in the ED on Venturi mask.  No infectious symptoms.  Pending lab work and remainder of work-up but will likely require admission for hypoxia and respiratory distress. Physical Exam  BP 134/75   Pulse 89   Temp (!) 97 F (36.1 C) (Oral)   Resp (!) 39   SpO2 96%   Physical Exam Vitals signs and nursing note reviewed.  Constitutional:      General: He is not in acute distress.    Appearance: He is well-developed.  HENT:     Head: Normocephalic and atraumatic.  Eyes:     General:        Right eye: No discharge.        Left eye: No discharge.     Conjunctiva/sclera: Conjunctivae normal.  Neck:     Vascular: No JVD.     Trachea: No tracheal deviation.  Cardiovascular:     Rate and Rhythm: Normal rate and regular rhythm.     Comments: 2+ pitting edema of the bilateral lower extremities, compartment soft. Pulmonary:     Breath sounds: Rales present.     Comments: Tachypneic, although appears more comfortable on Venturi mask and speaking in longer phrases Abdominal:     General: Abdomen is protuberant. There is distension.     Palpations: Abdomen is soft.     Tenderness: There is no abdominal tenderness.  Skin:    General: Skin is warm and dry.     Findings: No erythema.  Neurological:     Mental Status: He is alert.  Psychiatric:        Behavior: Behavior normal.     ED Course/Procedures     Procedures  MDM   Labs Reviewed   COMPREHENSIVE METABOLIC PANEL - Abnormal; Notable for the following components:      Result Value   Chloride 86 (*)    CO2 33 (*)    Glucose, Bld 333 (*)    BUN 101 (*)    Creatinine, Ser 2.52 (*)    GFR calc non Af Amer 24 (*)    GFR calc Af Amer 28 (*)    Anion gap 16 (*)    All other components within normal limits  CBC WITH DIFFERENTIAL/PLATELET - Abnormal; Notable for the following components:   RDW 16.1 (*)    Neutro Abs 8.0 (*)    Lymphs Abs 0.6 (*)    All other components within normal limits  APTT - Abnormal; Notable for the following components:   aPTT 23 (*)    All other components within normal limits  URINALYSIS, ROUTINE W REFLEX MICROSCOPIC - Abnormal; Notable for the following components:   Color, Urine STRAW (*)    Glucose, UA >=500 (*)    Nitrite POSITIVE (*)    Leukocytes,Ua TRACE (*)    Bacteria, UA RARE (*)    All other components within normal limits  TROPONIN I (HIGH SENSITIVITY) - Abnormal; Notable for the following components:  Troponin I (High Sensitivity) 29 (*)    All other components within normal limits  CULTURE, BLOOD (ROUTINE X 2)  SARS CORONAVIRUS 2 (HOSPITAL ORDER, PERFORMED IN [place] LAB)  CULTURE, BLOOD (ROUTINE X 2)  URINE CULTURE  LACTIC ACID, PLASMA  PROTIME-INR  BRAIN NATRIURETIC PEPTIDE  D-DIMER, QUANTITATIVE (NOT AT [facility])  TROPONIN I (HIGH SENSITIVITY)   Travis [name] was evaluated in Emergency Department on 10/04/2019 for the symptoms described in the history of present illness. He was evaluated in the context of the global COVID-19 pandemic, which necessitated consideration that the patient might be at risk for infection with the SARS-CoV-2 virus that causes COVID-19. Institutional protocols and algorithms that pertain to the evaluation of patients at risk for COVID-19 are in a state of rapid change based on information released by regulatory bodies including the CDC and federal and state organizations. These policies  and algorithms were followed during the patient's care in the ED.  Chest x-ray shows shallow degree of aeration with bibasilar segmental atelectasis, BNP within normal limits.  Remainder of lab work reviewed by me shows renal insufficiency around patient's baseline, hyperglycemia, mildly elevated anion gap however no ketonuria and unlikely to be in DKA.  His UA is equivocal for UTI, chart review shows that he is frequently treated for UTI and his culture is often growing multiple species.  We will culture but hold off on treatment at this time as he is not having any urinary symptoms.  His troponin is mildly elevated but this appears to be at baseline for the patient.  We will continue to trend this.  And her lab work reviewed by me shows no leukocytosis, no anemia.  Lactate within normal limits and he does not appear to be septic at this time.  Will add d-dimer given he is wheelchair-bound at baseline with limited mobility.  COVID test is negative.  Spoke with Dr. Hal Hope with [facility] hospitalist service who agrees to assume care of patient and bring him into the hospital for further evaluation and management.  Patient appears improved on Venturi mask and remains stable at this time.      Travis Papa, [name] 10/04/19 0149  Addendum: Patient's d-dimer came back mildly elevated, can age adjust for this.  Per chart review, patient's d-dimer is frequently elevated and his CTAs have been previously negative.    Travis Papa, [name] 10/04/19 [id number]    Noemi Chapel, MD 10/07/19 [phone]

## 2019-10-03 NOTE — ED Notes (Signed)
Family is waiting outside to come in--Harve Spradley

## 2019-10-03 NOTE — ED Triage Notes (Signed)
Per EMS pt from home presents with RD this am. BLE edema and weeping that started today as well. EMS reports pt sounds "wet" through out both lungs.

## 2019-10-03 NOTE — Progress Notes (Signed)
Pt brought in by EMS on NRB.  Pt assessed.  Pt has COPD so decision was made to try venturi mask to titrate O2.  Pt on 14L and 55% at this time with sat of 97.  RT explained VM to RN to titrate as needed.  RT will monitor.

## 2019-10-03 NOTE — ED Provider Notes (Signed)
I saw and evaluated the patient, reviewed the resident's note and I agree with the findings and plan.  Pertinent History: This is a critically ill-appearing 73 year old male who presents hypoxic, on his baseline oxygen at home was less than 90%, and respiratory distress with severe and worsening peripheral edema, becoming more dyspneic and now speaking in very short in 2-3 word sentences.  Found by paramedics to be hypoxic on arrival to the hospital with supplemental oxygen which has been increased to a significant amount.  Review of the medical record shows that the patient had been admitted to the hospital from 17 September through 19 September, was diagnosed with obesity hypoventilation syndrome, he is wheelchair-bound, polyneuropathy, oxygen dependent and severe lower extremity edema, taking high doses of torsemide including about 100 mg twice daily his last echocardiogram performed in May 2020 showed an ejection fraction between 60 and 65% but it was difficult to elucidate his diastolic dysfunction given his body habitus.  Pertinent Exam findings: The patient is severely dyspneic, hypoxic, tachycardic, severe bilateral symmetrical peripheral edema, moving very little air, speaking in 2-3 word sentences, using accessory muscles.  Respirations are approximately 36 breaths/min  I was personally present and directly supervised the following procedures:  Medical evaluation and resuscitation with stabilizing care  The patient will need to be admitted to the hospital, he will be diuresed, 120 mg of Lasix has been ordered by myself, he will need an x-ray, COVID swab and likely to come in for ongoing diuresis.  This patient's condition will likely continue to worsen and I do agree with palliative consult while he is in the hospital.  Critical care provided  .Critical Care Performed by: Noemi Chapel, MD Authorized by: Noemi Chapel, MD   Critical care provider statement:    Critical care time (minutes):   35   Critical care time was exclusive of:  Separately billable procedures and treating other patients and teaching time   Critical care was necessary to treat or prevent imminent or life-threatening deterioration of the following conditions:  Respiratory failure   Critical care was time spent personally by me on the following activities:  Blood draw for specimens, development of treatment plan with patient or surrogate, discussions with consultants, evaluation of patient's response to treatment, examination of patient, obtaining history from patient or surrogate, ordering and performing treatments and interventions, ordering and review of laboratory studies, ordering and review of radiographic studies, pulse oximetry, re-evaluation of patient's condition and review of old charts     I personally interpreted the EKG as well as the resident and agree with the interpretation on the resident's chart.  Final diagnoses:  Respiratory distress  Obesity hypoventilation syndrome (HCC)      Noemi Chapel, MD 10/07/19 [phone]

## 2019-10-04 ENCOUNTER — Encounter (HOSPITAL_COMMUNITY): Payer: Self-pay | Admitting: Internal Medicine

## 2019-10-04 DIAGNOSIS — N183 Chronic kidney disease, stage 3 unspecified: Secondary | ICD-10-CM | POA: Diagnosis not present

## 2019-10-04 DIAGNOSIS — J9622 Acute and chronic respiratory failure with hypercapnia: Secondary | ICD-10-CM | POA: Diagnosis not present

## 2019-10-04 DIAGNOSIS — E785 Hyperlipidemia, unspecified: Secondary | ICD-10-CM | POA: Diagnosis present

## 2019-10-04 DIAGNOSIS — L0390 Cellulitis, unspecified: Secondary | ICD-10-CM | POA: Diagnosis present

## 2019-10-04 DIAGNOSIS — E662 Morbid (severe) obesity with alveolar hypoventilation: Secondary | ICD-10-CM | POA: Diagnosis present

## 2019-10-04 DIAGNOSIS — J441 Chronic obstructive pulmonary disease with (acute) exacerbation: Secondary | ICD-10-CM | POA: Diagnosis present

## 2019-10-04 DIAGNOSIS — R0603 Acute respiratory distress: Secondary | ICD-10-CM | POA: Diagnosis not present

## 2019-10-04 DIAGNOSIS — Z515 Encounter for palliative care: Secondary | ICD-10-CM | POA: Diagnosis present

## 2019-10-04 DIAGNOSIS — E1165 Type 2 diabetes mellitus with hyperglycemia: Secondary | ICD-10-CM | POA: Diagnosis not present

## 2019-10-04 DIAGNOSIS — I5031 Acute diastolic (congestive) heart failure: Secondary | ICD-10-CM | POA: Diagnosis not present

## 2019-10-04 DIAGNOSIS — Z1623 Resistance to quinolones and fluoroquinolones: Secondary | ICD-10-CM | POA: Diagnosis present

## 2019-10-04 DIAGNOSIS — J9621 Acute and chronic respiratory failure with hypoxia: Secondary | ICD-10-CM | POA: Diagnosis present

## 2019-10-04 DIAGNOSIS — N179 Acute kidney failure, unspecified: Secondary | ICD-10-CM | POA: Diagnosis present

## 2019-10-04 DIAGNOSIS — J9601 Acute respiratory failure with hypoxia: Secondary | ICD-10-CM | POA: Diagnosis present

## 2019-10-04 DIAGNOSIS — I130 Hypertensive heart and chronic kidney disease with heart failure and stage 1 through stage 4 chronic kidney disease, or unspecified chronic kidney disease: Secondary | ICD-10-CM | POA: Diagnosis present

## 2019-10-04 DIAGNOSIS — L89153 Pressure ulcer of sacral region, stage 3: Secondary | ICD-10-CM | POA: Diagnosis present

## 2019-10-04 DIAGNOSIS — F329 Major depressive disorder, single episode, unspecified: Secondary | ICD-10-CM | POA: Diagnosis present

## 2019-10-04 DIAGNOSIS — Z66 Do not resuscitate: Secondary | ICD-10-CM | POA: Diagnosis present

## 2019-10-04 DIAGNOSIS — N390 Urinary tract infection, site not specified: Secondary | ICD-10-CM | POA: Diagnosis present

## 2019-10-04 DIAGNOSIS — Z20828 Contact with and (suspected) exposure to other viral communicable diseases: Secondary | ICD-10-CM | POA: Diagnosis present

## 2019-10-04 DIAGNOSIS — N184 Chronic kidney disease, stage 4 (severe): Secondary | ICD-10-CM

## 2019-10-04 DIAGNOSIS — G8250 Quadriplegia, unspecified: Secondary | ICD-10-CM | POA: Diagnosis present

## 2019-10-04 DIAGNOSIS — J9811 Atelectasis: Secondary | ICD-10-CM | POA: Diagnosis present

## 2019-10-04 DIAGNOSIS — I5033 Acute on chronic diastolic (congestive) heart failure: Secondary | ICD-10-CM | POA: Diagnosis present

## 2019-10-04 DIAGNOSIS — E1122 Type 2 diabetes mellitus with diabetic chronic kidney disease: Secondary | ICD-10-CM | POA: Diagnosis present

## 2019-10-04 DIAGNOSIS — L89152 Pressure ulcer of sacral region, stage 2: Secondary | ICD-10-CM | POA: Diagnosis not present

## 2019-10-04 DIAGNOSIS — Z888 Allergy status to other drugs, medicaments and biological substances status: Secondary | ICD-10-CM | POA: Diagnosis not present

## 2019-10-04 DIAGNOSIS — Z6841 Body Mass Index (BMI) 40.0 and over, adult: Secondary | ICD-10-CM | POA: Diagnosis not present

## 2019-10-04 DIAGNOSIS — E874 Mixed disorder of acid-base balance: Secondary | ICD-10-CM | POA: Diagnosis present

## 2019-10-04 LAB — COMPREHENSIVE METABOLIC PANEL
ALT: 26 U/L (ref 0–44)
ALT: 25 U/L (ref 0–44)
AST: 19 U/L (ref 15–41)
AST: 18 U/L (ref 15–41)
Albumin: 3.5 g/dL (ref 3.5–5.0)
Albumin: 3.1 g/dL — ABNORMAL LOW (ref 3.5–5.0)
Alkaline Phosphatase: 108 U/L (ref 38–126)
Alkaline Phosphatase: 89 U/L (ref 38–126)
Anion gap: 16 — ABNORMAL HIGH (ref 5–15)
Anion gap: 15 (ref 5–15)
BUN: 101 mg/dL — ABNORMAL HIGH (ref 8–23)
BUN: 99 mg/dL — ABNORMAL HIGH (ref 8–23)
CO2: 33 mmol/L — ABNORMAL HIGH (ref 22–32)
CO2: 34 mmol/L — ABNORMAL HIGH (ref 22–32)
Calcium: 9.1 mg/dL (ref 8.9–10.3)
Calcium: 8.8 mg/dL — ABNORMAL LOW (ref 8.9–10.3)
Chloride: 86 mmol/L — ABNORMAL LOW (ref 98–111)
Chloride: 88 mmol/L — ABNORMAL LOW (ref 98–111)
Creatinine, Ser: 2.52 mg/dL — ABNORMAL HIGH (ref 0.61–1.24)
Creatinine, Ser: 2.45 mg/dL — ABNORMAL HIGH (ref 0.61–1.24)
GFR calc Af Amer: 28 mL/min — ABNORMAL LOW (ref >60)
GFR calc Af Amer: 29 mL/min — ABNORMAL LOW (ref >60)
GFR calc non Af Amer: 24 mL/min — ABNORMAL LOW (ref >60)
GFR calc non Af Amer: 25 mL/min — ABNORMAL LOW (ref >60)
Glucose, Bld: 333 mg/dL — ABNORMAL HIGH (ref 70–99)
Glucose, Bld: 196 mg/dL — ABNORMAL HIGH (ref 70–99)
Potassium: 4.3 mmol/L (ref 3.5–5.1)
Potassium: 4 mmol/L (ref 3.5–5.1)
Sodium: 135 mmol/L (ref 135–145)
Sodium: 137 mmol/L (ref 135–145)
Total Bilirubin: 0.3 mg/dL (ref 0.3–1.2)
Total Bilirubin: 0.6 mg/dL (ref 0.3–1.2)
Total Protein: 7.5 g/dL (ref 6.5–8.1)
Total Protein: 6.5 g/dL (ref 6.5–8.1)

## 2019-10-04 LAB — POCT I-STAT 7, (LYTES, BLD GAS, ICA,H+H)
Acid-Base Excess: 13 mmol/L — ABNORMAL HIGH (ref 0.0–2.0)
Bicarbonate: 41.4 mmol/L — ABNORMAL HIGH (ref 20.0–28.0)
Calcium, Ion: 1.17 mmol/L (ref 1.15–1.40)
HCT: 38 % — ABNORMAL LOW (ref 39.0–52.0)
Hemoglobin: 12.9 g/dL — ABNORMAL LOW (ref 13.0–17.0)
O2 Saturation: 94 %
Patient temperature: 99
Potassium: 3.8 mmol/L (ref 3.5–5.1)
Sodium: 136 mmol/L (ref 135–145)
TCO2: 43 mmol/L — ABNORMAL HIGH (ref 22–32)
pCO2 arterial: 68.6 mmHg (ref 32.0–48.0)
pH, Arterial: 7.389 (ref 7.350–7.450)
pO2, Arterial: 75 mmHg — ABNORMAL LOW (ref 83.0–108.0)

## 2019-10-04 LAB — BRAIN NATRIURETIC PEPTIDE: B Natriuretic Peptide: 51.6 pg/mL (ref 0.0–100.0)

## 2019-10-04 LAB — CBC WITH DIFFERENTIAL/PLATELET
Abs Immature Granulocytes: 0.04 10*3/uL (ref 0.00–0.07)
Basophils Absolute: 0 10*3/uL (ref 0.0–0.1)
Basophils Relative: 0 %
Eosinophils Absolute: 0.1 10*3/uL (ref 0.0–0.5)
Eosinophils Relative: 1 %
HCT: 40.1 % (ref 39.0–52.0)
Hemoglobin: 12.7 g/dL — ABNORMAL LOW (ref 13.0–17.0)
Immature Granulocytes: 1 %
Lymphocytes Relative: 9 %
Lymphs Abs: 0.8 10*3/uL (ref 0.7–4.0)
MCH: 29.5 pg (ref 26.0–34.0)
MCHC: 31.7 g/dL (ref 30.0–36.0)
MCV: 93 fL (ref 80.0–100.0)
Monocytes Absolute: 0.6 10*3/uL (ref 0.1–1.0)
Monocytes Relative: 7 %
Neutro Abs: 7 10*3/uL (ref 1.7–7.7)
Neutrophils Relative %: 82 %
Platelets: 199 10*3/uL (ref 150–400)
RBC: 4.31 MIL/uL (ref 4.22–5.81)
RDW: 16.1 % — ABNORMAL HIGH (ref 11.5–15.5)
WBC: 8.5 10*3/uL (ref 4.0–10.5)
nRBC: 0 % (ref 0.0–0.2)

## 2019-10-04 LAB — SARS CORONAVIRUS 2 BY RT PCR (HOSPITAL ORDER, PERFORMED IN ~~LOC~~ HOSPITAL LAB): SARS Coronavirus 2: NEGATIVE

## 2019-10-04 LAB — URINALYSIS, ROUTINE W REFLEX MICROSCOPIC
Bilirubin Urine: NEGATIVE
Glucose, UA: >=500 mg/dL — AB
Hgb urine dipstick: NEGATIVE
Ketones, ur: NEGATIVE mg/dL
Nitrite: POSITIVE — AB
Protein, ur: NEGATIVE mg/dL
Specific Gravity, Urine: 1.008 (ref 1.005–1.030)
pH: 5 (ref 5.0–8.0)

## 2019-10-04 LAB — TSH: TSH: 1.897 u[IU]/mL (ref 0.350–4.500)

## 2019-10-04 LAB — MAGNESIUM: Magnesium: 2.7 mg/dL — ABNORMAL HIGH (ref 1.7–2.4)

## 2019-10-04 LAB — CBG MONITORING, ED
Glucose-Capillary: 183 mg/dL — ABNORMAL HIGH (ref 70–99)
Glucose-Capillary: 214 mg/dL — ABNORMAL HIGH (ref 70–99)

## 2019-10-04 LAB — TROPONIN I (HIGH SENSITIVITY)
Troponin I (High Sensitivity): 29 ng/L — ABNORMAL HIGH (ref <18)
Troponin I (High Sensitivity): 31 ng/L — ABNORMAL HIGH (ref <18)

## 2019-10-04 LAB — GLUCOSE, CAPILLARY
Glucose-Capillary: 199 mg/dL — ABNORMAL HIGH (ref 70–99)
Glucose-Capillary: 210 mg/dL — ABNORMAL HIGH (ref 70–99)

## 2019-10-04 LAB — D-DIMER, QUANTITATIVE: D-Dimer, Quant: 0.59 ug/mL-FEU — ABNORMAL HIGH (ref 0.00–0.50)

## 2019-10-04 MED ORDER — GABAPENTIN 400 MG PO CAPS
400.0000 mg | ORAL_CAPSULE | Freq: Three times a day (TID) | ORAL | Status: DC
  Administered 2019-10-04 – 2019-10-08 (×13): 400 mg via ORAL
  Filled 2019-10-04 (×13): qty 1

## 2019-10-04 MED ORDER — HYDRALAZINE HCL 25 MG PO TABS
37.5000 mg | ORAL_TABLET | Freq: Three times a day (TID) | ORAL | Status: DC
  Administered 2019-10-04 – 2019-10-12 (×25): 37.5 mg via ORAL
  Filled 2019-10-04 (×25): qty 2

## 2019-10-04 MED ORDER — CHLORHEXIDINE GLUCONATE CLOTH 2 % EX PADS
6.0000 | MEDICATED_PAD | Freq: Every day | CUTANEOUS | Status: DC
  Administered 2019-10-05 – 2019-10-12 (×8): 6 via TOPICAL

## 2019-10-04 MED ORDER — SODIUM CHLORIDE 0.9 % IV SOLN
2.0000 g | Freq: Two times a day (BID) | INTRAVENOUS | Status: DC
  Administered 2019-10-04 – 2019-10-05 (×2): 2 g via INTRAVENOUS
  Filled 2019-10-04 (×3): qty 2

## 2019-10-04 MED ORDER — POTASSIUM CHLORIDE CRYS ER 20 MEQ PO TBCR
20.0000 meq | EXTENDED_RELEASE_TABLET | Freq: Every day | ORAL | Status: DC
  Administered 2019-10-04 – 2019-10-12 (×9): 20 meq via ORAL
  Filled 2019-10-04 (×9): qty 1

## 2019-10-04 MED ORDER — PRAMIPEXOLE DIHYDROCHLORIDE 1 MG PO TABS
1.0000 mg | ORAL_TABLET | Freq: Every day | ORAL | Status: DC
  Administered 2019-10-04 – 2019-10-11 (×8): 1 mg via ORAL
  Filled 2019-10-04 (×8): qty 1

## 2019-10-04 MED ORDER — ACETAMINOPHEN 325 MG PO TABS
650.0000 mg | ORAL_TABLET | Freq: Four times a day (QID) | ORAL | Status: DC | PRN
  Administered 2019-10-04: 650 mg via ORAL
  Filled 2019-10-04: qty 2

## 2019-10-04 MED ORDER — METOLAZONE 5 MG PO TABS
2.5000 mg | ORAL_TABLET | Freq: Every day | ORAL | Status: DC
  Administered 2019-10-05 – 2019-10-08 (×4): 2.5 mg via ORAL
  Filled 2019-10-04 (×5): qty 1

## 2019-10-04 MED ORDER — POLYETHYLENE GLYCOL 3350 17 G PO PACK
17.0000 g | PACK | Freq: Every day | ORAL | Status: DC
  Administered 2019-10-04 – 2019-10-11 (×7): 17 g via ORAL
  Filled 2019-10-04 (×8): qty 1

## 2019-10-04 MED ORDER — INSULIN GLARGINE 100 UNIT/ML SOLOSTAR PEN: 60.0000 [IU] | PEN_INJECTOR | Freq: Every day | SUBCUTANEOUS | Status: DC

## 2019-10-04 MED ORDER — FESOTERODINE FUMARATE ER 8 MG PO TB24
8.0000 mg | ORAL_TABLET | Freq: Every day | ORAL | Status: DC
  Administered 2019-10-04 – 2019-10-11 (×8): 8 mg via ORAL
  Filled 2019-10-04 (×9): qty 1

## 2019-10-04 MED ORDER — ACETAMINOPHEN 650 MG RE SUPP: 650.0000 mg | Freq: Four times a day (QID) | RECTAL | Status: DC | PRN

## 2019-10-04 MED ORDER — INSULIN GLARGINE 100 UNIT/ML ~~LOC~~ SOLN
26.0000 [IU] | Freq: Every day | SUBCUTANEOUS | Status: DC
  Administered 2019-10-04: 26 [IU] via SUBCUTANEOUS
  Filled 2019-10-04 (×2): qty 0.26

## 2019-10-04 MED ORDER — ASPIRIN EC 81 MG PO TBEC
81.0000 mg | DELAYED_RELEASE_TABLET | Freq: Every day | ORAL | Status: DC
  Administered 2019-10-04 – 2019-10-12 (×9): 81 mg via ORAL
  Filled 2019-10-04 (×9): qty 1

## 2019-10-04 MED ORDER — ADULT MULTIVITAMIN W/MINERALS CH
1.0000 | ORAL_TABLET | Freq: Every day | ORAL | Status: DC
  Administered 2019-10-05 – 2019-10-12 (×8): 1 via ORAL
  Filled 2019-10-04 (×8): qty 1

## 2019-10-04 MED ORDER — ONDANSETRON HCL 4 MG PO TABS: 4.0000 mg | ORAL_TABLET | Freq: Four times a day (QID) | ORAL | Status: DC | PRN

## 2019-10-04 MED ORDER — INSULIN ASPART 100 UNIT/ML FLEXPEN: 22.0000 [IU] | PEN_INJECTOR | Freq: Three times a day (TID) | SUBCUTANEOUS | Status: DC

## 2019-10-04 MED ORDER — ESCITALOPRAM OXALATE 10 MG PO TABS
20.0000 mg | ORAL_TABLET | Freq: Every day | ORAL | Status: DC
  Administered 2019-10-04 – 2019-10-12 (×9): 20 mg via ORAL
  Filled 2019-10-04 (×9): qty 2

## 2019-10-04 MED ORDER — INSULIN ASPART 100 UNIT/ML ~~LOC~~ SOLN
0.0000 [IU] | Freq: Three times a day (TID) | SUBCUTANEOUS | Status: DC
  Administered 2019-10-04: 2 [IU] via SUBCUTANEOUS
  Administered 2019-10-04: 3 [IU] via SUBCUTANEOUS
  Administered 2019-10-04: 2 [IU] via SUBCUTANEOUS
  Administered 2019-10-05: 3 [IU] via SUBCUTANEOUS

## 2019-10-04 MED ORDER — SPIRONOLACTONE 25 MG PO TABS
25.0000 mg | ORAL_TABLET | Freq: Every day | ORAL | Status: DC
  Administered 2019-10-05 – 2019-10-12 (×8): 25 mg via ORAL
  Filled 2019-10-04 (×9): qty 1

## 2019-10-04 MED ORDER — ATORVASTATIN CALCIUM 10 MG PO TABS
20.0000 mg | ORAL_TABLET | Freq: Every evening | ORAL | Status: DC
  Administered 2019-10-04 – 2019-10-12 (×9): 20 mg via ORAL
  Filled 2019-10-04 (×8): qty 2

## 2019-10-04 MED ORDER — PRAMIPEXOLE DIHYDROCHLORIDE 0.25 MG PO TABS
0.5000 mg | ORAL_TABLET | Freq: Every day | ORAL | Status: DC
  Administered 2019-10-05 – 2019-10-12 (×8): 0.5 mg via ORAL
  Filled 2019-10-04 (×8): qty 2

## 2019-10-04 MED ORDER — LEVOTHYROXINE SODIUM 75 MCG PO TABS
175.0000 ug | ORAL_TABLET | Freq: Every day | ORAL | Status: DC
  Administered 2019-10-04 – 2019-10-12 (×9): 175 ug via ORAL
  Filled 2019-10-04 (×9): qty 1

## 2019-10-04 MED ORDER — TAMSULOSIN HCL 0.4 MG PO CAPS
0.4000 mg | ORAL_CAPSULE | Freq: Every day | ORAL | Status: DC
  Administered 2019-10-04 – 2019-10-11 (×8): 0.4 mg via ORAL
  Filled 2019-10-04 (×8): qty 1

## 2019-10-04 MED ORDER — FUROSEMIDE 10 MG/ML IJ SOLN
120.0000 mg | Freq: Three times a day (TID) | INTRAVENOUS | Status: DC
  Administered 2019-10-04 – 2019-10-11 (×21): 120 mg via INTRAVENOUS
  Filled 2019-10-04: qty 12
  Filled 2019-10-04 (×3): qty 10
  Filled 2019-10-04: qty 12
  Filled 2019-10-04 (×2): qty 2
  Filled 2019-10-04 (×3): qty 10
  Filled 2019-10-04 (×3): qty 12
  Filled 2019-10-04 (×2): qty 10
  Filled 2019-10-04: qty 12
  Filled 2019-10-04: qty 10
  Filled 2019-10-04 (×2): qty 12
  Filled 2019-10-04 (×2): qty 10
  Filled 2019-10-04 (×2): qty 2
  Filled 2019-10-04 (×2): qty 10

## 2019-10-04 MED ORDER — ENOXAPARIN SODIUM 40 MG/0.4ML ~~LOC~~ SOLN
40.0000 mg | SUBCUTANEOUS | Status: DC
  Administered 2019-10-04: 40 mg via SUBCUTANEOUS
  Filled 2019-10-04 (×2): qty 0.4

## 2019-10-04 MED ORDER — INSULIN ASPART 100 UNIT/ML ~~LOC~~ SOLN
8.0000 [IU] | Freq: Three times a day (TID) | SUBCUTANEOUS | Status: DC
  Administered 2019-10-04 – 2019-10-05 (×3): 8 [IU] via SUBCUTANEOUS

## 2019-10-04 MED ORDER — FUROSEMIDE 10 MG/ML IJ SOLN
120.0000 mg | Freq: Two times a day (BID) | INTRAVENOUS | Status: DC
  Filled 2019-10-04 (×2): qty 12

## 2019-10-04 MED ORDER — ONDANSETRON HCL 4 MG/2ML IJ SOLN
4.0000 mg | Freq: Four times a day (QID) | INTRAMUSCULAR | Status: DC | PRN
  Administered 2019-10-04: 4 mg via INTRAVENOUS
  Filled 2019-10-04: qty 2

## 2019-10-04 NOTE — Progress Notes (Signed)
Patient seen and examined in the ED.  Patient has obvious increased work of breathing.  Currently satting well on oxygen mask.  He states that his symptoms have been getting worse since this past Saturday but cannot recall any exacerbating factors.  He sees Dr. Haroldine [name] in the office for CHF.  Patient was given diuretics overnight but continues to have significant volume overload.  He denies any fever, chills, chest pain.  Per nursing, the patient's PCP, Dr.Ehinger, but recently diagnosed the patient with a UTI and was previously on Bactrim however cultures came back and his PCP call ED today to switch to Cipro.  I called the PCPs office and was notified the patient was diagnosed with a Pseudomonas UTI recently but did not have the date.  Additionally, I was notified that the patient was due for NovoLog 22 units with his meals however had a blood sugar of 180 and she was regularly concerned this would drop his blood sugar.  Physical exam On exam patient has obvious increased work of breathing despite being on oxygen mask however he is satting well.  He is significantly volume overloaded with 3+ pitting edema in lower extremities.  Also has abdominal distention.  Patient had bilateral lower extremity erythema which was warm to the touch without purulence.  Assessment/plan 1. Patient's cardiologist was consulted 2. We will change ceftriaxone to cefepime for coverage of nonpurulent cellulitis as well as Pseudomonas UTI.  Follow-up on urine culture 3. Can likely discontinue azithromycin once procalcitonin comes back as patient does not have any leukocytosis or fever and has signs for heart failure leading to shortness of breath 4. Change to reduced weight-based insulin dosing while hospitalized: 1. NovoLog 8 units 3 times daily with meals 2. Lantus 26 units nightly 3. Sensitive sliding scale 3 times daily 5. Place patient on BiPAP for increased work of breathing.  He is on BiPAP nightly at  home 6. Continue to [place], DO

## 2019-10-04 NOTE — ED Notes (Signed)
Ebony Hail in Pharmacy contacted re Flex Pen.

## 2019-10-04 NOTE — ED Notes (Signed)
Pt removes venti mask to eat and desats to 88% pt urged to maintain mask.

## 2019-10-04 NOTE — ED Notes (Signed)
Family calls and states they were informed that Dr. Marisue Humble of [facility] MD told them they would need to change from bactrim to Cipro after culture returned. Family also requested consult by Dr. Hoyle Barr for heart failure. Dr. Neysa Bonito contacted with above information.

## 2019-10-04 NOTE — ED Notes (Signed)
Pt states he is feeling better.  

## 2019-10-04 NOTE — Progress Notes (Signed)
ABG results reported to MD. RT okay to hold off on bipap at this time.

## 2019-10-04 NOTE — ED Notes (Signed)
Tele ordered bfast 

## 2019-10-04 NOTE — Progress Notes (Signed)
Patient is on BiPAP QHS at this time. Tolerating his home machine well no complications noted.

## 2019-10-04 NOTE — ED Notes (Signed)
ED TO INPATIENT HANDOFF REPORT  ED Nurse Name and Phone #: [facility] [phone]  S Name/Age/Gender [place] 73 y.o. male Room/Bed: 036C/036C  Code Status   Code Status: DNR  Home/SNF/Other Home Patient oriented to: self, place, time and situation Is this baseline? Yes   Triage Complete: Triage complete  Chief Complaint Resp distress  Triage Note Per EMS pt from home presents with RD this am. BLE edema and weeping that started today as well. EMS reports pt sounds "wet" through out both lungs.    Allergies Allergies  Allergen Reactions  . Amlodipine Swelling and Other (See Comments)    Causes edema (per [facility] paperwork)  . Plendil [Felodipine] Swelling and Other (See Comments)    Causes edema (per [facility] paperwork)  . Compazine [Prochlorperazine] Other (See Comments)    Caused agitation (per [facility] paperwork)  . Cymbalta [Duloxetine Hcl] Other (See Comments)    Made the patient "feel weird," per [facility] paperwork  . Jardiance [Empagliflozin] Other (See Comments)    Stopped by MD because of unwanted side effects  . Other Nausea Only and Other (See Comments)    ANESTHESIA UNSPECIFIED- makes sick, must have anti-nausea medication in advance  . Levofloxacin In D5w Itching    Possible infusion reaction 03/29/17. Tolerating oral Levaquin 03/30/17  . Septra [Sulfamethoxazole-Trimethoprim] Nausea Only and Other (See Comments)    Per [facility] paperwork    Level of Care/Admitting Diagnosis ED Disposition    ED Disposition Condition [place]: [place] [[id number]]  Level of Care: Progressive [102]  Covid Evaluation: Asymptomatic Screening Protocol (No Symptoms)  Diagnosis: Acute respiratory failure with hypoxia [id number]  Admitting Physician: Travis [name] [phone]  Attending Physician: Travis [name] [phone]  Estimated length of stay: past midnight tomorrow  Certification:: I  certify this patient will need inpatient services for at least 2 midnights  PT Class (Do Not Modify): Inpatient [101]  PT Acc Code (Do Not Modify): Private [1]       B Medical/Surgery History Past Medical History:  Diagnosis Date  . Cervical myelopathy (HCC)    with myelomalacia at C5-6 and C3-4  . CHF (congestive heart failure) ([place])   . Constipation - functional    controlled with miralax  . COPD (chronic obstructive pulmonary disease) (HCC)    mild  . DJD (degenerative joint disease)    WHOLE SPINE  . Dyspnea   . Gait disorder   . Heart murmur    years ago   . Hypertension   . Hypothyroidism    Low d/t treatment of hyperthyroidism  . Lumbosacral spinal stenosis 03/13/2014  . On home oxygen therapy    "2.5L all the time" (06/29/2018)  . Peripheral neuropathy   . Pneumonia   . PONV (postoperative nausea and vomiting)   . RBBB   . Renal disorder    acute kidney failure  . Restless legs syndrome 03/14/2015  . RLS (restless legs syndrome)   . Sleep apnea    DX  2009/2010 study done at [facility]  . 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 Type II diabetes mellitus ([place])    diet controlled   Past Surgical History:  Procedure Laterality Date  . BACK SURGERY     6 surgeries  . CARDIAC CATHETERIZATION [name] 09/07/2015   Procedure: Right/Left Heart Cath and Coronary Angiography;  Surgeon: Peter M Martinique, MD;  Location: [facility];  Service: Cardiovascular;  Laterality: [name];  . CHOLECYSTECTOMY    . EYE SURGERY     BIL CATARACTS  . LUMBAR LAMINECTOMY/DECOMPRESSION MICRODISCECTOMY  07/14/2012   Procedure: LUMBAR LAMINECTOMY/DECOMPRESSION MICRODISCECTOMY 2 LEVELS;  Surgeon: Eustace Moore, MD;  Location: [facility];  Service: Neurosurgery;  Laterality: Left;  Lumbar two three laminectomy, left thoracic four five transpedicular diskectomy  . POSTERIOR CERVICAL FUSION/FORAMINOTOMY  04/08/2018   Procedure: Laminectomy and Foraminotomy - Lumbar Four-Lumbar Five, instrumented  posterolateral fusion Lumbar Four- Five, Laminectomy and Foraminotomy - Thoracic One-Thoracic Two;  Surgeon: Eustace Moore, MD;  Location: [facility];  Service: Neurosurgery;;  posterior  . POSTERIOR LUMBAR FUSION  04/08/2018   Laminectomy and Foraminotomy - Lumbar Four-Lumbar Five, instrumented posterolateral fusion Lumbar Four- Five, Laminectomy and Foraminotomy - Thoracic One-Thoracic Two  . POSTERIOR LUMBAR FUSION  04/08/2018   Laminectomy and Foraminotomy - Lumbar Four-Lumbar Five, instrumented posterolateral fusion Lumbar Four- Five, Laminectomy and Foraminotomy - Thoracic One-Thoracic Two  . RIGHT HEART CATH [name] 07/18/2019   Procedure: RIGHT HEART CATH;  Surgeon: Jolaine Artist, MD;  Location: [facility];  Service: Cardiovascular;  Laterality: [name];     A IV Location/Drains/Wounds Patient Lines/Drains/Airways Status   Active Line/Drains/Airways    Name:   Placement date:   Placement time:   Site:   Days:   Peripheral IV 10/03/19 Left Antecubital   10/03/19    2147    Antecubital   1   Peripheral IV 10/03/19 Left Forearm   10/03/19    2355    Forearm   1   Urethral Catheter Ben, RN Latex 16 Fr.   10/04/19    0020    Latex   less than 1   External Urinary Catheter   09/16/19    1150    -   18   Incision (Closed) 04/08/18 Back Other (Comment)   04/08/18    1128     544   Incision (Closed) 04/08/18 Back   04/08/18    1308     544   Pressure Ulcer 07/27/14 Stage II -  Partial thickness loss of dermis presenting as a shallow open ulcer with a red, pink wound bed without slough.   07/27/14    1000     1895   Pressure Injury 07/02/18   07/02/18    1900     459   Pressure Injury 11/21/18 Stage III -  Full thickness tissue loss. Subcutaneous fat may be visible but bone, tendon or muscle are NOT exposed. Full thicknes small open area to sacrum.   11/21/18    0005     317   Pressure Injury 05/01/19 Stage II -  Partial thickness loss of dermis presenting as a shallow open ulcer with a red,  pink wound bed without slough.   05/01/19    0615     156   Pressure Injury 09/16/19 Sacrum Stage II -  Partial thickness loss of dermis presenting as a shallow open ulcer with a red, pink wound bed without slough. yellow   09/16/19    1608     18   Wound / Incision (Open or Dehisced) 06/28/18 Other (Comment) Other (Comment)   06/28/18    [id number]    Other (Comment)   463          Intake/Output Last 24 hours  Intake/Output Summary (Last 24 hours) at 10/04/2019 1305 Last data filed at 10/04/2019 0938 Gross per 24 hour  Intake 340 ml  Output 950 ml  Net -610 ml    Labs/Imaging Results for orders placed or performed during the hospital encounter of 10/03/19 (from the past 48 hour(s))  Comprehensive metabolic panel     Status: Abnormal   Collection Time: 10/03/19 10:50 PM  Result Value Ref Range   Sodium 135 135 - 145 mmol/L   Potassium 4.3 3.5 - 5.1 mmol/L   Chloride 86 (L) 98 - 111 mmol/L   CO2 33 (H) 22 - 32 mmol/L   Glucose, Bld 333 (H) 70 - 99 mg/dL   BUN 101 (H) 8 - 23 mg/dL   Creatinine, Ser 2.52 (H) 0.61 - 1.24 mg/dL   Calcium 9.1 8.9 - 10.3 mg/dL   Total Protein 7.5 6.5 - 8.1 g/dL   Albumin 3.5 3.5 - 5.0 g/dL   AST 19 15 - 41 U/L   ALT 26 0 - 44 U/L   Alkaline Phosphatase 108 38 - 126 U/L   Total Bilirubin 0.3 0.3 - 1.2 mg/dL   GFR calc non Af Amer 24 (L) >60 mL/min   GFR calc Af Amer 28 (L) >60 mL/min   Anion gap 16 (H) 5 - 15    Comment: Performed at [facility], [address] [place], [place] [address]  CBC WITH DIFFERENTIAL     Status: Abnormal   Collection Time: 10/03/19 10:50 PM  Result Value Ref Range   WBC 9.3 4.0 - 10.5 K/uL   RBC 4.70 4.22 - 5.81 MIL/uL   Hemoglobin 14.1 13.0 - 17.0 g/dL   HCT 42.8 39.0 - 52.0 %   MCV 91.1 80.0 - 100.0 fL   MCH 30.0 26.0 - 34.0 pg   MCHC 32.9 30.0 - 36.0 g/dL   RDW 16.1 (H) 11.5 - 15.5 %   Platelets 218 150 - 400 K/uL   nRBC 0.0 0.0 - 0.2 %   Neutrophils Relative % 85 %   Neutro Abs 8.0 (H) 1.7 - 7.7 K/uL    Lymphocytes Relative 7 %   Lymphs Abs 0.6 (L) 0.7 - 4.0 K/uL   Monocytes Relative 6 %   Monocytes Absolute 0.5 0.1 - 1.0 K/uL   Eosinophils Relative 1 %   Eosinophils Absolute 0.1 0.0 - 0.5 K/uL   Basophils Relative 0 %   Basophils Absolute 0.0 0.0 - 0.1 K/uL   Immature Granulocytes 1 %   Abs Immature Granulocytes 0.06 0.00 - 0.07 K/uL    Comment: Performed at [facility] [address] [place], [place] [address]  APTT     Status: Abnormal   Collection Time: 10/03/19 10:50 PM  Result Value Ref Range   aPTT 23 (L) 24 - 36 seconds    Comment: Performed at [facility] [address] [place], [place] [address]  Protime-INR     Status: None   Collection Time: 10/03/19 10:50 PM  Result Value Ref Range   Prothrombin Time 13.4 11.4 - 15.2 seconds   INR 1.0 0.8 - 1.2    Comment: (NOTE) INR goal varies based on device and disease states. Performed at [facility], [place] [address] [place], [place] [address]   Troponin I (High Sensitivity)     Status: Abnormal   Collection Time: 10/03/19 10:50 PM  Result Value Ref Range   Troponin I (High Sensitivity) 29 (H) <18 ng/L    Comment: (NOTE) Elevated high sensitivity troponin I (hsTnI) values and significant  changes across serial measurements  may suggest ACS but many other  chronic and acute conditions are known to elevate hsTnI results.  Refer to the Links section for chest pain algorithms and additional  guidance. Performed at [facility], [place] [address] [place], [place] [address]   Brain natriuretic peptide     Status: None   Collection Time: 10/03/19 10:50 PM  Result Value Ref Range   B Natriuretic Peptide 51.6 0.0 - 100.0 pg/mL    Comment: Performed at [facility] [address] [place], [place] [address]  Lactic acid, plasma     Status: None   Collection Time: 10/03/19 11:25 PM  Result Value Ref Range   Lactic Acid, Venous 1.6 0.5 - 1.9 mmol/L    Comment: Performed at [facility] [address], [place], [place] [address]  SARS Coronavirus 2 [facility] order, Performed in [facility] hospital lab) Nasopharyngeal Nasopharyngeal Swab     Status: None   Collection Time: 10/03/19 11:27 PM   Specimen: Nasopharyngeal Swab  Result Value Ref Range   SARS Coronavirus 2 NEGATIVE NEGATIVE    Comment: (NOTE) If result is NEGATIVE SARS-CoV-2 target nucleic acids are NOT DETECTED. The SARS-CoV-2 RNA is generally detectable in upper and lower  respiratory specimens during the acute phase of infection. The lowest  concentration of SARS-CoV-2 viral copies this assay can detect is 250  copies / mL. A negative result does not preclude SARS-CoV-2 infection  and should not be used as the sole basis for treatment or other  patient management decisions.  A negative result may occur with  improper specimen collection / handling, submission of specimen other  than nasopharyngeal swab, presence of viral mutation(s) within the  areas targeted by this assay, and inadequate number of viral copies  (<250 copies / mL). A negative result must be combined with clinical  observations, patient history, and epidemiological information. If result is POSITIVE SARS-CoV-2 target nucleic acids are DETECTED. The SARS-CoV-2 RNA is generally detectable in upper and lower  respiratory specimens dur ing the acute phase of infection.  Positive  results are indicative of active infection with SARS-CoV-2.  Clinical  correlation with patient history and other diagnostic information is  necessary to determine patient infection status.  Positive results do  not rule out bacterial infection or co-infection with other viruses. If result is PRESUMPTIVE POSTIVE SARS-CoV-2 nucleic acids MAY BE PRESENT.   A presumptive positive result was obtained on the submitted specimen  and confirmed on repeat testing.  While 2019 novel coronavirus  (SARS-CoV-2) nucleic acids may be present in the submitted sample  additional  confirmatory testing may be necessary for epidemiological  and / or clinical management purposes  to differentiate between  SARS-CoV-2 and other Sarbecovirus currently known to infect humans.  If clinically indicated additional testing with an alternate test  methodology [phone]) is advised. The SARS-CoV-2 RNA is generally  detectable in upper and lower respiratory sp ecimens during the acute  phase of infection. The expected result is Negative. Fact Sheet for Patients:  StrictlyIdeas.no Fact Sheet for Healthcare Providers: BankingDealers.co.za This test is not yet approved or cleared by the [place] FDA and has been authorized for detection and/or diagnosis of SARS-CoV-2 by FDA under an Emergency Use Authorization (EUA).  This EUA will remain in effect (meaning this test can be used) for the duration of the COVID-19 declaration under Section 564(b)(1) of the Act, 21 U.S.C. section 360bbb-3(b)(1), unless the authorization is terminated or revoked sooner.  Performed at [facility], [place] [address], [place], [place] [address]   Blood Culture (routine x 2)     Status: None (Preliminary result)   Collection Time: 10/03/19 11:30 PM   Specimen: BLOOD  Result Value Ref Range   Specimen Description BLOOD    Special Requests      BOTTLES DRAWN AEROBIC AND ANAEROBIC Blood Culture adequate volume   Culture      NO GROWTH < 12 HOURS Performed at [facility], [place] [address], [place], [place] [address]    Report Status PENDING   Blood Culture (routine x 2)     Status: None (Preliminary result)   Collection Time: 10/03/19 11:35 PM   Specimen: BLOOD LEFT ARM  Result Value Ref Range   Specimen Description BLOOD LEFT ARM    Special Requests      BOTTLES DRAWN AEROBIC AND ANAEROBIC Blood Culture adequate volume   Culture      NO GROWTH < 12 HOURS Performed at [facility], [place] [name] [place], [place] [address]     Report Status PENDING   Urinalysis, Routine w reflex microscopic     Status: Abnormal   Collection Time: 10/04/19 12:18 AM  Result Value Ref Range   Color, Urine STRAW (A) YELLOW   APPearance CLEAR CLEAR   Specific Gravity, Urine 1.008 1.005 - 1.030   pH 5.0 5.0 - 8.0   Glucose, UA >=500 (A) NEGATIVE mg/dL   Hgb urine dipstick NEGATIVE NEGATIVE   Bilirubin Urine NEGATIVE NEGATIVE   Ketones, ur NEGATIVE NEGATIVE mg/dL   Protein, ur NEGATIVE NEGATIVE mg/dL   Nitrite POSITIVE (A) NEGATIVE   Leukocytes,Ua TRACE (A) NEGATIVE   RBC / HPF 0-5 0 - 5 RBC/hpf   WBC, UA 0-5 0 - 5 WBC/hpf   Bacteria, UA RARE (A) NONE SEEN    Comment: Performed at [facility] [address] [place], [place] [address]  D-dimer, quantitative (not at [facility])     Status: Abnormal   Collection Time: 10/04/19  1:08 AM  Result Value Ref Range   D-Dimer, Quant 0.59 (H) 0.00 - 0.50 ug/mL-FEU    Comment: (NOTE) At the manufacturer cut-off of 0.50 ug/mL FEU, this assay has been documented to exclude PE with a sensitivity and negative predictive value of 97 to 99%.  At this time, this assay has not been approved by the FDA to exclude DVT/VTE. Results should be correlated with clinical presentation. Performed at [facility], [place] [address], [place], [place] [address]   Troponin I (High Sensitivity)     Status: Abnormal   Collection Time: 10/04/19  1:08 AM  Result Value Ref Range   Troponin I (High Sensitivity) 31 (H) <18 ng/L    Comment: (NOTE) Elevated high sensitivity troponin I (hsTnI) values and significant  changes across serial measurements may suggest ACS but many other  chronic and acute conditions are known to elevate hsTnI results.  Refer to the "Links" section for chest pain algorithms and additional  guidance. Performed at [facility], [place] [address], [place], [place] [address]   CBG monitoring, ED     Status: Abnormal   Collection Time: 10/04/19  7:55 AM  Result Value Ref Range    Glucose-Capillary 183 (H) 70 - 99 mg/dL  Comprehensive metabolic panel     Status: Abnormal   Collection Time: 10/04/19  8:25 AM  Result Value Ref Range   Sodium 137 135 -  145 mmol/L   Potassium 4.0 3.5 - 5.1 mmol/L   Chloride 88 (L) 98 - 111 mmol/L   CO2 34 (H) 22 - 32 mmol/L   Glucose, Bld 196 (H) 70 - 99 mg/dL   BUN 99 (H) 8 - 23 mg/dL   Creatinine, Ser 2.45 (H) 0.61 - 1.24 mg/dL   Calcium 8.8 (L) 8.9 - 10.3 mg/dL   Total Protein 6.5 6.5 - 8.1 g/dL   Albumin 3.1 (L) 3.5 - 5.0 g/dL   AST 18 15 - 41 U/L   ALT 25 0 - 44 U/L   Alkaline Phosphatase 89 38 - 126 U/L   Total Bilirubin 0.6 0.3 - 1.2 mg/dL   GFR calc non Af Amer 25 (L) >60 mL/min   GFR calc Af Amer 29 (L) >60 mL/min   Anion gap 15 5 - 15    Comment: Performed at [facility] [address], [place], [place] [address]  Magnesium     Status: Abnormal   Collection Time: 10/04/19  8:25 AM  Result Value Ref Range   Magnesium 2.7 (H) 1.7 - 2.4 mg/dL    Comment: Performed at [facility] [address], [place], [place] [address]  CBC WITH DIFFERENTIAL     Status: Abnormal   Collection Time: 10/04/19  8:25 AM  Result Value Ref Range   WBC 8.5 4.0 - 10.5 K/uL   RBC 4.31 4.22 - 5.81 MIL/uL   Hemoglobin 12.7 (L) 13.0 - 17.0 g/dL   HCT 40.1 39.0 - 52.0 %   MCV 93.0 80.0 - 100.0 fL   MCH 29.5 26.0 - 34.0 pg   MCHC 31.7 30.0 - 36.0 g/dL   RDW 16.1 (H) 11.5 - 15.5 %   Platelets 199 150 - 400 K/uL   nRBC 0.0 0.0 - 0.2 %   Neutrophils Relative % 82 %   Neutro Abs 7.0 1.7 - 7.7 K/uL   Lymphocytes Relative 9 %   Lymphs Abs 0.8 0.7 - 4.0 K/uL   Monocytes Relative 7 %   Monocytes Absolute 0.6 0.1 - 1.0 K/uL   Eosinophils Relative 1 %   Eosinophils Absolute 0.1 0.0 - 0.5 K/uL   Basophils Relative 0 %   Basophils Absolute 0.0 0.0 - 0.1 K/uL   Immature Granulocytes 1 %   Abs Immature Granulocytes 0.04 0.00 - 0.07 K/uL    Comment: Performed at [facility] [address], [place], [place] [address]  TSH      Status: None   Collection Time: 10/04/19  8:26 AM  Result Value Ref Range   TSH 1.897 0.350 - 4.500 uIU/mL    Comment: Performed by a 3rd Generation assay with a functional sensitivity of <=0.01 uIU/mL. Performed at [facility], [place] [address] [place], [place] [address]   I-STAT 7, (LYTES, BLD GAS, ICA, H+H)     Status: Abnormal   Collection Time: 10/04/19 11:25 AM  Result Value Ref Range   pH, Arterial 7.389 7.350 - 7.450   pCO2 arterial 68.6 (HH) 32.0 - 48.0 mmHg   pO2, Arterial 75.0 (L) 83.0 - 108.0 mmHg   Bicarbonate 41.4 (H) 20.0 - 28.0 mmol/L   TCO2 43 (H) 22 - 32 mmol/L   O2 Saturation 94.0 %   Acid-Base Excess 13.0 (H) 0.0 - 2.0 mmol/L   Sodium 136 135 - 145 mmol/L   Potassium 3.8 3.5 - 5.1 mmol/L   Calcium, Ion 1.17 1.15 - 1.40 mmol/L  HCT 38.0 (L) 39.0 - 52.0 %   Hemoglobin 12.9 (L) 13.0 - 17.0 g/dL   Patient temperature 99.0 F    Collection site RADIAL, ALLEN'S TEST ACCEPTABLE    Drawn by RT    Sample type ARTERIAL    Comment NOTIFIED PHYSICIAN    Dg Chest Port 1 View  Result Date: 10/03/2019 CLINICAL DATA:  Shortness of breath EXAM: PORTABLE CHEST 1 VIEW COMPARISON:  September 15, 2019 FINDINGS: The heart size and mediastinal contours are within normal limits. Again noted is elevation of the right hemidiaphragm. Shallow degree of aeration. Streaky atelectasis seen at both lung bases. IMPRESSION: Shallow degree of aeration with bibasilar segmental atelectasis. Electronically Signed   By: Prudencio Pair M.D.   On: 10/03/2019 23:34    Pending Labs Unresulted Labs (From admission, onward)    Start     Ordered   10/11/19 0500  Creatinine, serum  (enoxaparin (LOVENOX)    CrCl >/= 30 ml/min)  Weekly,   R    Comments: while on enoxaparin therapy    10/04/19 0541   10/04/19 1110  Blood gas, arterial  Once,   R     10/04/19 1109   10/04/19 0540  CBC  (enoxaparin (LOVENOX)    CrCl >/= 30 ml/min)  Once,   STAT    Comments: Baseline for enoxaparin therapy IF NOT  ALREADY DRAWN.  Notify MD if PLT < 100 K.    10/04/19 0541   10/03/19 2255  Urine culture  ONCE - STAT,   STAT     10/03/19 2255          Vitals/Pain Today's Vitals   10/04/19 1215 10/04/19 1230 10/04/19 1245 10/04/19 1300  BP: 131/63 132/75 127/64 134/71  Pulse: 81 85 81 81  Resp:    (!) 25  Temp:      TempSrc:      SpO2: 94% (!) 89% 94% 92%  PainSc:        Isolation Precautions No active isolations  Medications Medications  azithromycin (ZITHROMAX) 500 mg in sodium chloride 0.9 % 250 mL IVPB (0 mg Intravenous Stopped 10/04/19 0120)  aspirin EC tablet 81 mg (81 mg Oral Given 10/04/19 1134)  atorvastatin (LIPITOR) tablet 20 mg (has no administration in time range)  hydrALAZINE (APRESOLINE) tablet 37.5 mg (37.5 mg Oral Given 10/04/19 1135)  metolazone (ZAROXOLYN) tablet 2.5 mg (has no administration in time range)  spironolactone (ALDACTONE) tablet 25 mg (has no administration in time range)  escitalopram (LEXAPRO) tablet 20 mg (20 mg Oral Given 10/04/19 1134)  levothyroxine (SYNTHROID) tablet 175 mcg (175 mcg Oral Given 10/04/19 0651)  polyethylene glycol (MIRALAX / GLYCOLAX) packet 17 g (has no administration in time range)  tamsulosin (FLOMAX) capsule 0.4 mg (has no administration in time range)  fesoterodine (TOVIAZ) tablet 8 mg (has no administration in time range)  gabapentin (NEURONTIN) capsule 400 mg (400 mg Oral Given 10/04/19 1134)  pramipexole (MIRAPEX) tablet 0.5-1 mg (has no administration in time range)  multivitamin with minerals tablet 1 tablet (has no administration in time range)  potassium chloride SA (KLOR-CON) CR tablet 20 mEq (20 mEq Oral Given 10/04/19 1134)  acetaminophen (TYLENOL) tablet 650 mg (has no administration in time range)    Or  acetaminophen (TYLENOL) suppository 650 mg (has no administration in time range)  ondansetron (ZOFRAN) tablet 4 mg (has no administration in time range)    Or  ondansetron (ZOFRAN) injection 4 mg (has no administration  in time range)  insulin  aspart (novoLOG) injection 0-9 Units (2 Units Subcutaneous Given 10/04/19 0830)  enoxaparin (LOVENOX) injection 40 mg (has no administration in time range)  furosemide (LASIX) 120 mg in dextrose 5 % 50 mL IVPB (120 mg Intravenous New Bag/Given 10/04/19 1124)  insulin glargine (LANTUS) injection 26 Units (has no administration in time range)  insulin aspart (novoLOG) injection 8 Units (has no administration in time range)  ceFEPIme (MAXIPIME) 2 g in sodium chloride 0.9 % 100 mL IVPB (has no administration in time range)  furosemide (LASIX) 120 mg in dextrose 5 % 50 mL IVPB (0 mg Intravenous Stopped 10/04/19 0046)    Mobility walks with device Moderate fall risk   Focused Assessments Pulmonary Assessment Handoff:  Lung sounds: Bilateral Breath Sounds: Diminished L Breath Sounds: Coarse crackles R Breath Sounds: Coarse crackles O2 Device: Venturi Mask O2 Flow Rate (L/min): 14 L/min      R Recommendations: See Admitting Provider Note  Report given to:   Additional Notes:

## 2019-10-04 NOTE — H&P (Signed)
History and Physical    Travis [name] JQG:[id number] DOB: 1946-06-18 DOA: 10/03/2019  PCP: [name], MD  Patient coming from: Home.  Chief Complaint: Shortness of breath.  HPI: Travis [name] is a 73 y.o. male with history of chronic diastolic CHF, diabetes mellitus type 2, COPD, hypertension, hypothyroidism, obesity hypoventilation on BiPAP at bedtime, chronic kidney disease stage III baseline creatinine is around 2 has had recent admissions for CHF presents to the ER because of worsening shortness of breath.  Patient stated he got acutely short of breath last few days with increasing lower extremity edema abdominal distention.  Denies any nausea vomiting chest pain productive cough fever chills or diarrhea.  Has been compliant with his medications.  Patient is bedbound and nonambulatory with history of spinal stenosis.  ED Course: In the ER patient was hypoxic requiring nonrebreather and chest x-ray showed bilateral atelectasis.  Patient was afebrile COVID-19 test was negative.  EKG shows normal sinus rhythm with RBBB.  On exam patient has elevated JVD bilateral lower extreme edema and also bilateral lower extremity erythematous changes of the both lower extremities.  Patient was given Lasix 120 mg IV and also empirically started on antibiotics by the ER physician.  Labs reveal creatinine 2.5 BNP 51.6 high-sensitivity troponin was 29 lactic acid 1.6 WBC 9.3 hemoglobin 14.1 UA shows trace ketones leukocyte Estrace positive WBC negative.  Patient admitted for acute hypoxic respiratory failure likely from CHF.  Review of Systems: As per HPI, rest all negative.   Past Medical History:  Diagnosis Date  . Cervical myelopathy (HCC)    with myelomalacia at C5-6 and C3-4  . CHF (congestive heart failure) ([place])   . Constipation - functional    controlled with miralax  . COPD (chronic obstructive pulmonary disease) (HCC)    mild  . DJD (degenerative joint disease)    WHOLE SPINE  .  Dyspnea   . Gait disorder   . Heart murmur    years ago   . Hypertension   . Hypothyroidism    Low d/t treatment of hyperthyroidism  . Lumbosacral spinal stenosis 03/13/2014  . On home oxygen therapy    "2.5L all the time" (06/29/2018)  . Peripheral neuropathy   . Pneumonia   . PONV (postoperative nausea and vomiting)   . RBBB   . Renal disorder    acute kidney failure  . Restless legs syndrome 03/14/2015  . RLS (restless legs syndrome)   . Sleep apnea    DX  2009/2010 [facility] done at [facility]  . 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 Type II diabetes mellitus ([place])    diet controlled    Past Surgical History:  Procedure Laterality Date  . BACK SURGERY     6 surgeries  . CARDIAC CATHETERIZATION [name] 09/07/2015   Procedure: Right/Left Heart Cath and Coronary Angiography;  Surgeon: Peter M Martinique, MD;  Location: [place] [facility];  Service: Cardiovascular;  Laterality: [name];  . CHOLECYSTECTOMY    . EYE SURGERY     BIL CATARACTS  . LUMBAR LAMINECTOMY/DECOMPRESSION MICRODISCECTOMY  07/14/2012   Procedure: LUMBAR LAMINECTOMY/DECOMPRESSION MICRODISCECTOMY 2 LEVELS;  Surgeon: Eustace Moore, MD;  Location: [place] [facility];  Service: Neurosurgery;  Laterality: Left;  Lumbar two three laminectomy, left thoracic four five transpedicular diskectomy  . POSTERIOR CERVICAL FUSION/FORAMINOTOMY  04/08/2018   Procedure: Laminectomy and Foraminotomy - Lumbar Four-Lumbar Five, instrumented posterolateral fusion Lumbar Four- Five, Laminectomy and Foraminotomy - Thoracic One-Thoracic Two;  Surgeon:  Eustace Moore, MD;  Location: [facility];  Service: Neurosurgery;;  posterior  . POSTERIOR LUMBAR FUSION  04/08/2018   Laminectomy and Foraminotomy - Lumbar Four-Lumbar Five, instrumented posterolateral fusion Lumbar Four- Five, Laminectomy and Foraminotomy - Thoracic One-Thoracic Two  . POSTERIOR LUMBAR FUSION  04/08/2018   Laminectomy and Foraminotomy - Lumbar Four-Lumbar Five, instrumented posterolateral fusion Lumbar  Four- Five, Laminectomy and Foraminotomy - Thoracic One-Thoracic Two  . RIGHT HEART CATH [name] 07/18/2019   Procedure: RIGHT HEART CATH;  Surgeon: Jolaine Artist, MD;  Location: [place] [facility];  Service: Cardiovascular;  Laterality: [name];     reports that he quit smoking about 34 years ago. His smoking use included cigarettes. He has a 40.00 pack-year smoking history. He quit smokeless tobacco use about 34 years ago. He reports that he does not drink alcohol or use drugs.  Allergies  Allergen Reactions  . Amlodipine Swelling and Other (See Comments)    Causes edema (per [facility] paperwork)  . Plendil [Felodipine] Swelling and Other (See Comments)    Causes edema (per [facility] paperwork)  . Compazine [Prochlorperazine] Other (See Comments)    Caused agitation (per [facility] paperwork)  . Cymbalta [Duloxetine Hcl] Other (See Comments)    Made the patient "feel weird," per [facility] paperwork  . Jardiance [Empagliflozin] Other (See Comments)    Stopped by MD because of unwanted side effects  . Other Nausea Only and Other (See Comments)    ANESTHESIA UNSPECIFIED- makes sick, must have anti-nausea medication in advance  . Levofloxacin In D5w Itching    Possible infusion reaction 03/29/17. Tolerating oral Levaquin 03/30/17  . Septra [Sulfamethoxazole-Trimethoprim] Nausea Only and Other (See Comments)    Per [facility] paperwork    Family History  Problem Relation Age of Onset  . Polycythemia Mother   . Diabetes Father   . Hypertension Father   . Heart disease Father   . CAD Other        1 of his 4 brothers and father has CAD    Prior to Admission medications   Medication Sig Start Date End Date Taking? Authorizing Provider  aspirin EC 81 MG tablet Take 81 mg by mouth daily.    [provider]  atorvastatin (LIPITOR) 20 MG tablet Take 20 mg by mouth every evening.     [provider]  Blood Glucose Monitoring Suppl (ONETOUCH  VERIO) w/Device KIT 1 kit by Does not apply route 4 (four) times daily. Use as directed to test blood sugars 4 times daily DX E11.42 03/04/19   Shamleffer, Melanie Crazier, MD  cefdinir (OMNICEF) 300 MG capsule Take 300 mg by mouth 2 (two) times daily.    [provider]  Continuous Blood Gluc Receiver (FREESTYLE LIBRE 14 DAY READER) DEVI 1 kit by Does not apply route 3 (three) times daily. Patient not taking: Reported on 09/28/2019 02/07/19   Shamleffer, Melanie Crazier, MD  Continuous Blood Gluc Sensor (FREESTYLE LIBRE 14 DAY SENSOR) MISC 1 kit by Does not apply route 3 (three) times daily. Patient not taking: Reported on 09/28/2019 02/07/19   Shamleffer, Melanie Crazier, MD  escitalopram (LEXAPRO) 20 MG tablet Take 20 mg by mouth daily.    [provider]  gabapentin (NEURONTIN) 400 MG capsule Take 400 mg by mouth 3 (three) times daily.    [provider]  glucose blood test strip Use as instructed to test blood sugars 4 times daily DX E11.42 03/04/19   Shamleffer, Melanie Crazier, MD  hydrALAZINE (APRESOLINE) 25 MG tablet Take 1.5 tablets (37.5 mg total) by mouth 3 (three) times daily. 08/22/19   Clegg, Amy D, NP  insulin aspart (NOVOLOG FLEXPEN) 100 UNIT/ML FlexPen Inject 22 Units into the skin 3 (three) times daily with meals. 09/28/19   Shamleffer, Melanie Crazier, MD  Insulin Glargine (LANTUS SOLOSTAR) 100 UNIT/ML Solostar Pen Inject 60 Units into the skin at bedtime. 09/28/19   Shamleffer, Melanie Crazier, MD  Insulin Pen Needle 32G X 4 MM MISC 1 Device by Does not apply route 4 (four) times daily. 09/28/19   Shamleffer, Melanie Crazier, MD  Lancets [facility] ULTRASOFT) lancets Use as instructed to test blood sugars 4 times daily DX E11.42 03/04/19   Shamleffer, Melanie Crazier, MD  levothyroxine (SYNTHROID, LEVOTHROID) 175 MCG tablet Take 1 tablet (175 mcg total) by mouth daily. 05/12/18   Angiulli, Lavon Paganini, [name]  metolazone (ZAROXOLYN) 2.5 MG tablet Take 1 tablet (2.5 mg total)  by mouth daily. 08/31/19   Lyda Jester M, [name]  multivitamin (ONE-A-DAY MEN'S) TABS tablet Take 1 tablet by mouth daily.     [provider]  OXYGEN Inhale 2-3 L/min into the lungs continuous.     [provider]  polyethylene glycol (MIRALAX / GLYCOLAX) packet Take 17 g by mouth daily as needed. For constipation Patient taking differently: Take 17 g by mouth at bedtime.  08/09/14   Love, Ivan Anchors, [name]  potassium chloride SA (K-DUR) 20 MEQ tablet Take 1 tablet by mouth three times daily. 09/26/19   Clegg, Amy D, NP  pramipexole (MIRAPEX) 0.5 MG tablet One tablet in the morning and 2 in the evening Patient taking differently: Take 0.5-1 mg by mouth See admin instructions. Take 0.5 mg by mouth in the morning and 1 mg at bedtime 11/24/18   Kathrynn Ducking, MD  spironolactone (ALDACTONE) 25 MG tablet Take 1 tablet (25 mg total) by mouth daily. 10/29/18   Nahser, Wonda Cheng, MD  tamsulosin (FLOMAX) 0.4 MG CAPS capsule Take 1 capsule (0.4 mg total) by mouth daily. Patient taking differently: Take 0.4 mg by mouth at bedtime.  05/12/18   Angiulli, Lavon Paganini, [name]  torsemide (DEMADEX) 100 MG tablet Take 1 tablet (100 mg total) by mouth 2 (two) times daily. 09/26/19   Clegg, Amy D, NP  TOVIAZ 8 MG TB24 tablet Take 1 tablet (8 mg total) by mouth daily. Patient taking differently: Take 8 mg by mouth at bedtime.  05/12/18   Angiulli, Lavon Paganini, [name]    Physical Exam: Constitutional: Moderately built and nourished. Vitals:   10/04/19 0215 10/04/19 0230 10/04/19 0245 10/04/19 0300  BP: 126/69 119/66 120/67 123/71  Pulse: 87 79 78 81  Resp: (!) 35 (!) 22 (!) 21 (!) 23  Temp:      TempSrc:      SpO2: 93% 93% 94% 94%   Eyes: Anicteric no pallor. ENMT: No discharge from the ears eyes nose or mouth. Neck: JVD elevated no mass felt. Respiratory: No rhonchi or crepitations. Cardiovascular: S1-S2 heard. Abdomen: Soft nontender bowel sounds present. Musculoskeletal: Bilateral lower extremity  edema present. Skin: Erythematous changes of both lower extremities.  Appears to be chronic. Neurologic: Alert awake oriented to time place and person.  Moves all extremities. Psychiatric: Appears normal.   Labs on Admission: I have personally reviewed following labs and imaging studies  CBC: Recent Labs  Lab 10/03/19 2250  WBC 9.3  NEUTROABS 8.0*  HGB 14.1  HCT 42.8  MCV 91.1  PLT 218  Basic Metabolic Panel: Recent Labs  Lab 10/03/19 2250  NA 135  K 4.3  CL 86*  CO2 33*  GLUCOSE 333*  BUN 101*  CREATININE 2.52*  CALCIUM 9.1   GFR: Estimated Creatinine Clearance: 36.3 mL/min (A) (by C-G formula based on SCr of 2.52 mg/dL (H)). Liver Function Tests: Recent Labs  Lab 10/03/19 2250  AST 19  ALT 26  ALKPHOS 108  BILITOT 0.3  PROT 7.5  ALBUMIN 3.5   No results for input(s): LIPASE, AMYLASE in the last 168 hours. No results for input(s): AMMONIA in the last 168 hours. Coagulation Profile: Recent Labs  Lab 10/03/19 2250  INR 1.0   Cardiac Enzymes: No results for input(s): CKTOTAL, CKMB, CKMBINDEX, TROPONINI in the last 168 hours. BNP (last 3 results) No results for input(s): PROBNP in the last 8760 hours. HbA1C: No results for input(s): HGBA1C in the last 72 hours. CBG: No results for input(s): GLUCAP in the last 168 hours. Lipid Profile: No results for input(s): CHOL, HDL, LDLCALC, TRIG, CHOLHDL, LDLDIRECT in the last 72 hours. Thyroid Function Tests: No results for input(s): TSH, T4TOTAL, FREET4, T3FREE, THYROIDAB in the last 72 hours. Anemia Panel: No results for input(s): VITAMINB12, FOLATE, FERRITIN, TIBC, IRON, RETICCTPCT in the last 72 hours. Urine analysis:    Component Value Date/Time   COLORURINE STRAW (A) 10/04/2019 0018   APPEARANCEUR CLEAR 10/04/2019 0018   LABSPEC 1.008 10/04/2019 0018   PHURINE 5.0 10/04/2019 0018   GLUCOSEU >=500 (A) 10/04/2019 0018   HGBUR NEGATIVE 10/04/2019 0018   BILIRUBINUR NEGATIVE 10/04/2019 0018    KETONESUR NEGATIVE 10/04/2019 0018   PROTEINUR NEGATIVE 10/04/2019 0018   UROBILINOGEN 0.2 08/06/2019 1739   NITRITE POSITIVE (A) 10/04/2019 0018   LEUKOCYTESUR TRACE (A) 10/04/2019 0018   Sepsis Labs: @LABRCNTIP (procalcitonin:4,lacticidven:4) ) Recent Results (from the past 240 hour(s))  SARS Coronavirus 2 [facility] order, Performed in [facility] hospital lab) Nasopharyngeal Nasopharyngeal Swab     Status: None   Collection Time: 10/03/19 11:27 PM   Specimen: Nasopharyngeal Swab  Result Value Ref Range Status   SARS Coronavirus 2 NEGATIVE NEGATIVE Final    Comment: (NOTE) If result is NEGATIVE SARS-CoV-2 target nucleic acids are NOT DETECTED. The SARS-CoV-2 RNA is generally detectable in upper and lower  respiratory specimens during the acute phase of infection. The lowest  concentration of SARS-CoV-2 viral copies this assay can detect is 250  copies / mL. A negative result does not preclude SARS-CoV-2 infection  and should not be used as the sole basis for treatment or other  patient management decisions.  A negative result may occur with  improper specimen collection / handling, submission of specimen other  than nasopharyngeal swab, presence of viral mutation(s) within the  areas targeted by this assay, and inadequate number of viral copies  (<250 copies / mL). A negative result must be combined with clinical  observations, patient history, and epidemiological information. If result is POSITIVE SARS-CoV-2 target nucleic acids are DETECTED. The SARS-CoV-2 RNA is generally detectable in upper and lower  respiratory specimens dur ing the acute phase of infection.  Positive  results are indicative of active infection with SARS-CoV-2.  Clinical  correlation with patient history and other diagnostic information is  necessary to determine patient infection status.  Positive results do  not rule out bacterial infection or co-infection with other viruses. If result is PRESUMPTIVE  POSTIVE SARS-CoV-2 nucleic acids MAY BE PRESENT.   A presumptive positive result was obtained on the submitted specimen  and confirmed on repeat testing.  While 2019 novel coronavirus  (SARS-CoV-2) nucleic acids may be present in the submitted sample  additional confirmatory testing may be necessary for epidemiological  and / or clinical management purposes  to differentiate between  SARS-CoV-2 and other Sarbecovirus currently known to infect humans.  If clinically indicated additional testing with an alternate test  methodology [phone]) is advised. The SARS-CoV-2 RNA is generally  detectable in upper and lower respiratory sp ecimens during the acute  phase of infection. The expected result is Negative. Fact Sheet for Patients:  StrictlyIdeas.no Fact Sheet for Healthcare Providers: BankingDealers.co.za This test is not yet approved or cleared by the [place] FDA and has been authorized for detection and/or diagnosis of SARS-CoV-2 by FDA under an Emergency Use Authorization (EUA).  This EUA will remain in effect (meaning this test can be used) for the duration of the COVID-19 declaration under Section 564(b)(1) of the Act, 21 U.S.C. section 360bbb-3(b)(1), unless the authorization is terminated or revoked sooner. Performed at [facility], [place] [address], [place], [place] [address]   Blood Culture (routine x 2)     Status: None (Preliminary result)   Collection Time: 10/03/19 11:35 PM   Specimen: BLOOD LEFT ARM  Result Value Ref Range Status   Specimen Description BLOOD LEFT ARM  Final   Special Requests   Final    BOTTLES DRAWN AEROBIC AND ANAEROBIC Blood Culture adequate volume Performed at [facility], [place] [address], [place], [place] [address]    Culture PENDING  Incomplete   Report Status PENDING  Incomplete     Radiological Exams on Admission: Dg Chest Port 1 View  Result Date: 10/03/2019 CLINICAL DATA:   Shortness of breath EXAM: PORTABLE CHEST 1 VIEW COMPARISON:  September 15, 2019 FINDINGS: The heart size and mediastinal contours are within normal limits. Again noted is elevation of the right hemidiaphragm. Shallow degree of aeration. Streaky atelectasis seen at both lung bases. IMPRESSION: Shallow degree of aeration with bibasilar segmental atelectasis. Electronically Signed   By: Prudencio Pair M.D.   On: 10/03/2019 23:34    EKG: Independently reviewed.  Normal sinus rhythm RBBB.  Assessment/Plan Principal Problem:   Acute respiratory failure with hypoxia (HCC) Active Problems:   Tetraparesis (HCC)   Obesity, Class III, BMI 40-49.9 (morbid obesity) ([place])   Type 2 diabetes mellitus with stage 3 chronic kidney disease, with long-term current use of insulin (HCC)   Acute CHF (congestive heart failure) (HCC)   Obesity hypoventilation syndrome ([place])    1. Acute respiratory failure with hypoxia likely from CHF last EF measured in May 2020 was 60 to 65% will place patient on Lasix 40 mg IV every 8 with metolazone.  Closely follow intake output daily weights and metabolic panel.  Patient is on empiric antibiotics for pneumonia started by ER physician which can be discontinued if procalcitonin is negative.  However patient does have some signs of cellulitis of the lower extremities. 2. Diabetes mellitus type 2 on long-acting insulin and pre-meal insulin.  On sliding scale insulin. 3. COPD not actively wheezing. 4. Hypothyroidism on Synthroid. 5. Obesity hypoventilation on BiPAP at bedtime. 6. Acute on chronic kidney disease stage III with baseline creatinine around 2 it is around 2.5 now.  Closely follow intake output that patient is on IV Lasix.  May have to hold spironolactone if there is any further worsening of creatinine. 7. Bilateral lower extremity erythema appears to be chronic.  Presently on antibiotics.  Closely observe. 8. Hyperlipidemia on statins. 9. Depression on Lexapro. 10.  Chronically bedbound with history of spinal stenosis. 11. Hypertension on hydralazine.   DVT prophylaxis: Lovenox: Code Status: DNR confirmed with patient. Family Communication: Discussed with patient. Disposition Plan: Home. Consults called: None. Admission status: Inpatient.   Rise Patience MD [facility] Pager [phone].  If 7PM-7AM, please contact night-coverage [email or web address] Password [email or web address]  10/04/2019, 5:42 AM

## 2019-10-04 NOTE — Consult Note (Addendum)
Advanced Heart Failure Team Consult Note   Primary Physician: [name], MD PCP-Cardiologist:  Mertie Moores, MD  HF MD: Dr [name]   Reason for Consultation: A/C Diastolic HF   HPI:    Travis [name] is seen today for evaluation of heart failure at the request of Dr Hal Hope.  Travis [name] is a 73 y.o. 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  Admitted 5/2-5/13/20 with volume overload. Diuresed with lasix drip and metolazone. Had AKI that improved with switching back to oral diuretics. Also treated for cellulitis with doxy. PT recommended SNF, but he refused. DC weight 281 lbs. He was discharged on torsemide 80 mg daily with 2.5 mg metolazone weekly. Discharged with [facility]/HH[facility].   Admitted 3/70/48 with A/C diastolic HF. Diuresed with lasix drip and later transitioned to torsemide 80 mg twice a day + metolazone twice a week.Discharge weight was 285 pounds.  Re-admitted 07/21/19 with AMS. He was treated for UTI and sent home the next day.  Admitted 09/16/19 with chest pain. Placed on Bipap HS Trop low. Given steroids and due to concern for AKI,  diuretics held a day.    I saw him in the HF clinic on 09/22/19 and he was continued on torsemide 100/100 + metolazone daily. He was wearing his oxygen intermittently and remained SOB with exertion and required assistance for ADLs. Only able to stand for a moment otherwise he was  chair bound.  Presented to [facility] ED via EMS with increased SOB and hypoxia. Placed on NRB. CXR was with bibasilar atelectasis. Pertinent admission labs included: K 4.3, creatinine 2.52, BNP 51.6, WBC 9.3, HS Trop 29>31, and lactic acid 1.6. Last night he received 120 mg IV lasix. Started on antibiotics for possible CAP.   Remains SOB at rest.   Echo 05/01/2019-EF 60-65% RV normal.   RHC 7/20//20  RA = 8 RV = 42/9 PA = 44/11 (26)Mild pulmonary  hypertension. PCW = 13 Fick cardiac output/index = 8.8/3.5 PVR = 1.5 WU Ao sat = 93% PA sat = 79%, 71%  Review of Systems: [y] = yes, [ ]  = no   . General: Weight gain [ ] ; Weight loss [ ] ; Anorexia [ ] ; Fatigue [Y ]; Fever [ ] ; Chills [ ] ; Weakness [Y ]  . Cardiac: Chest pain/pressure [ ] ; Resting SOB [Y ]; Exertional SOB [Y]; Orthopnea [Y ]; Pedal Edema [Y ]; Palpitations [ ] ; Syncope [ ] ; Presyncope [ ] ; Paroxysmal nocturnal dyspnea[ ]   . Pulmonary: Cough [ ] ; Wheezing[ ] ; Hemoptysis[ ] ; Sputum [ ] ; Snoring [ ]   . GI: Vomiting[ ] ; Dysphagia[ ] ; Melena[ ] ; Hematochezia [ ] ; Heartburn[ ] ; Abdominal pain [ ] ; Constipation [ ] ; Diarrhea [ ] ; BRBPR [ ]   . GU: Hematuria[ ] ; Dysuria [ ] ; Nocturia[ ]   . Vascular: Pain in legs with walking [Y ]; Pain in feet with lying flat [ ] ; Non-healing sores [ ] ; Stroke [ ] ; TIA [ ] ; Slurred speech [ ] ;  . Neuro: Headaches[ ] ; Vertigo[ ] ; Seizures[ ] ; Paresthesias[ ] ;Blurred vision [ ] ; Diplopia [ ] ; Vision changes [ ]   . Ortho/Skin: Arthritis [ ] ; Joint pain [Y ]; Muscle pain [ ] ; Joint swelling [ ] ; Back Pain [Y ]; Rash [ ]   . Psych: Depression[Y ]; Anxiety[ ]   . Heme: Bleeding problems [ ] ; Clotting disorders [ ] ; Anemia [ ]   . Endocrine: Diabetes [Y ]; Thyroid dysfunction[ ]   Home Medications Prior to Admission medications   Medication Sig Start Date End Date Taking? Authorizing Provider  aspirin EC 81 MG tablet Take 81 mg by mouth daily.    [provider]  atorvastatin (LIPITOR) 20 MG tablet Take 20 mg by mouth every evening.     [provider]  Blood Glucose Monitoring Suppl (ONETOUCH VERIO) w/Device KIT 1 kit by Does not apply route 4 (four) times daily. Use as directed to test blood sugars 4 times daily DX E11.42 03/04/19   Shamleffer, Melanie Crazier, MD  cefdinir (OMNICEF) 300 MG capsule Take 300 mg by mouth 2 (two) times daily.    [provider]  Continuous Blood Gluc Receiver (FREESTYLE LIBRE 14 DAY READER) DEVI 1  kit by Does not apply route 3 (three) times daily. Patient not taking: Reported on 09/28/2019 02/07/19   Shamleffer, Melanie Crazier, MD  Continuous Blood Gluc Sensor (FREESTYLE LIBRE 14 DAY SENSOR) MISC 1 kit by Does not apply route 3 (three) times daily. Patient not taking: Reported on 09/28/2019 02/07/19   Shamleffer, Melanie Crazier, MD  escitalopram (LEXAPRO) 20 MG tablet Take 20 mg by mouth daily.    [provider]  gabapentin (NEURONTIN) 400 MG capsule Take 400 mg by mouth 3 (three) times daily.    [provider]  glucose blood test strip Use as instructed to test blood sugars 4 times daily DX E11.42 03/04/19   Shamleffer, Melanie Crazier, MD  hydrALAZINE (APRESOLINE) 25 MG tablet Take 1.5 tablets (37.5 mg total) by mouth 3 (three) times daily. 08/22/19   Clegg, Amy D, NP  insulin aspart (NOVOLOG FLEXPEN) 100 UNIT/ML FlexPen Inject 22 Units into the skin 3 (three) times daily with meals. 09/28/19   Shamleffer, Melanie Crazier, MD  Insulin Glargine (LANTUS SOLOSTAR) 100 UNIT/ML Solostar Pen Inject 60 Units into the skin at bedtime. 09/28/19   Shamleffer, Melanie Crazier, MD  Insulin Pen Needle 32G X 4 MM MISC 1 Device by Does not apply route 4 (four) times daily. 09/28/19   Shamleffer, Melanie Crazier, MD  Lancets [facility] ULTRASOFT) lancets Use as instructed to test blood sugars 4 times daily DX E11.42 03/04/19   Shamleffer, Melanie Crazier, MD  levothyroxine (SYNTHROID, LEVOTHROID) 175 MCG tablet Take 1 tablet (175 mcg total) by mouth daily. 05/12/18   Angiulli, Lavon Paganini, [name]  metolazone (ZAROXOLYN) 2.5 MG tablet Take 1 tablet (2.5 mg total) by mouth daily. 08/31/19   Lyda Jester M, [name]  multivitamin (ONE-A-DAY MEN'S) TABS tablet Take 1 tablet by mouth daily.     [provider]  OXYGEN Inhale 2-3 L/min into the lungs continuous.     [provider]  polyethylene glycol (MIRALAX / GLYCOLAX) packet Take 17 g by mouth daily as needed. For constipation Patient  taking differently: Take 17 g by mouth at bedtime.  08/09/14   Love, Ivan Anchors, [name]  potassium chloride SA (K-DUR) 20 MEQ tablet Take 1 tablet by mouth three times daily. 09/26/19   Clegg, Amy D, NP  pramipexole (MIRAPEX) 0.5 MG tablet One tablet in the morning and 2 in the evening Patient taking differently: Take 0.5-1 mg by mouth See admin instructions. Take 0.5 mg by mouth in the morning and 1 mg at bedtime 11/24/18   Kathrynn Ducking, MD  spironolactone (ALDACTONE) 25 MG tablet Take 1 tablet (25 mg total) by mouth daily. 10/29/18   Nahser, Wonda Cheng, MD  tamsulosin (FLOMAX) 0.4 MG CAPS capsule Take 1 capsule (0.4 mg total) by mouth daily.  Patient taking differently: Take 0.4 mg by mouth at bedtime.  05/12/18   Angiulli, Lavon Paganini, [name]  torsemide (DEMADEX) 100 MG tablet Take 1 tablet (100 mg total) by mouth 2 (two) times daily. 09/26/19   Clegg, Amy D, NP  TOVIAZ 8 MG TB24 tablet Take 1 tablet (8 mg total) by mouth daily. Patient taking differently: Take 8 mg by mouth at bedtime.  05/12/18   Angiulli, Lavon Paganini, [name]    Past Medical History: Past Medical History:  Diagnosis Date  . Cervical myelopathy (HCC)    with myelomalacia at C5-6 and C3-4  . CHF (congestive heart failure) ([place])   . Constipation - functional    controlled with miralax  . COPD (chronic obstructive pulmonary disease) (HCC)    mild  . DJD (degenerative joint disease)    WHOLE SPINE  . Dyspnea   . Gait disorder   . Heart murmur    years ago   . Hypertension   . Hypothyroidism    Low d/t treatment of hyperthyroidism  . Lumbosacral spinal stenosis 03/13/2014  . On home oxygen therapy    "2.5L all the time" (06/29/2018)  . Peripheral neuropathy   . Pneumonia   . PONV (postoperative nausea and vomiting)   . RBBB   . Renal disorder    acute kidney failure  . Restless legs syndrome 03/14/2015  . RLS (restless legs syndrome)   . Sleep apnea    DX  2009/2010 study done at [facility]  . 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  Type II diabetes mellitus ([place])    diet controlled    Past Surgical History: Past Surgical History:  Procedure Laterality Date  . BACK SURGERY     6 surgeries  . CARDIAC CATHETERIZATION [name] 09/07/2015   Procedure: Right/Left Heart Cath and Coronary Angiography;  Surgeon: Peter M Martinique, MD;  Location: [place] [facility];  Service: Cardiovascular;  Laterality: [name];  . CHOLECYSTECTOMY    . EYE SURGERY     BIL CATARACTS  . LUMBAR LAMINECTOMY/DECOMPRESSION MICRODISCECTOMY  07/14/2012   Procedure: LUMBAR LAMINECTOMY/DECOMPRESSION MICRODISCECTOMY 2 LEVELS;  Surgeon: Eustace Moore, MD;  Location: [place] [facility] OR[facility];  Service: Neurosurgery;  Laterality: Left;  Lumbar two three laminectomy, left thoracic four five transpedicular diskectomy  . POSTERIOR CERVICAL FUSION/FORAMINOTOMY  04/08/2018   Procedure: Laminectomy and Foraminotomy - Lumbar Four-Lumbar Five, instrumented posterolateral fusion Lumbar Four- Five, Laminectomy and Foraminotomy - Thoracic One-Thoracic Two;  Surgeon: Eustace Moore, MD;  Location: [facility] OR;  Service: Neurosurgery;;  posterior  . POSTERIOR LUMBAR FUSION  04/08/2018   Laminectomy and Foraminotomy - Lumbar Four-Lumbar Five, instrumented posterolateral fusion Lumbar Four- Five, Laminectomy and Foraminotomy - Thoracic One-Thoracic Two  . POSTERIOR LUMBAR FUSION  04/08/2018   Laminectomy and Foraminotomy - Lumbar Four-Lumbar Five, instrumented posterolateral fusion Lumbar Four- Five, Laminectomy and Foraminotomy - Thoracic One-Thoracic Two  . RIGHT HEART CATH [name] 07/18/2019   Procedure: RIGHT HEART CATH;  Surgeon: Jolaine Artist, MD;  Location: [place] [facility];  Service: Cardiovascular;  Laterality: [name];    Family History: Family History  Problem Relation Age of Onset  . Polycythemia Mother   . Diabetes Father   . Hypertension Father   . Heart disease Father   . CAD Other        1 of his 4 brothers and father has CAD    Social History: Social History   Socioeconomic  History  . Marital status: Married  Spouse name: carolyn  . Number of children: 3  . Years of education: 81  . Highest education level: Not on file  Occupational History    Employer: [employer]: Works in [place]  . Financial resource strain: Not on file  . Food insecurity    Worry: Not on file    Inability: Not on file  . Transportation needs    Medical: Not on file    Non-medical: Not on file  Tobacco Use  . Smoking status: Former Smoker    Packs/day: 2.00    Years: 20.00    Pack years: 40.00    Types: Cigarettes    Quit date: 06/28/1985    Years since quitting: 34.2  . Smokeless tobacco: Former Systems developer    Quit date: 06/28/1985  Substance and Sexual Activity  . Alcohol use: No  . Drug use: No  . Sexual activity: Not on file  Lifestyle  . Physical activity    Days per week: Not on file    Minutes per session: Not on file  . Stress: Not on file  Relationships  . Social Herbalist on phone: Not on file    Gets together: Not on file    Attends religious service: Not on file    Active member of club or organization: Not on file    Attends meetings of clubs or organizations: Not on file    Relationship status: Not on file  Other Topics Concern  . Not on file  Social History Narrative   Lives with wife,  [name]   Patient is married with 3 children.   Patient has 16 yrs of education.   Patient is right handed.   Patient drinks 2 cups of caffeine per day.    Allergies:  Allergies  Allergen Reactions  . Amlodipine Swelling and Other (See Comments)    Causes edema (per [facility] paperwork)  . Plendil [Felodipine] Swelling and Other (See Comments)    Causes edema (per [facility] paperwork)  . Compazine [Prochlorperazine] Other (See Comments)    Caused agitation (per [facility] paperwork)  . Cymbalta [Duloxetine Hcl] Other (See Comments)    Made the patient "feel weird," per [facility]  paperwork  . Jardiance [Empagliflozin] Other (See Comments)    Stopped by MD because of unwanted side effects  . Other Nausea Only and Other (See Comments)    ANESTHESIA UNSPECIFIED- makes sick, must have anti-nausea medication in advance  . Levofloxacin In D5w Itching    Possible infusion reaction 03/29/17. Tolerating oral Levaquin 03/30/17  . Septra [Sulfamethoxazole-Trimethoprim] Nausea Only and Other (See Comments)    Per [facility] paperwork    Objective:    Vital Signs:   Temp:  [97 F (36.1 C)] 97 F (36.1 C) (10/05 2259) Pulse Rate:  [78-92] 87 (10/06 0933) Resp:  [15-45] 45 (10/06 0933) BP: (108-162)/(61-89) 131/67 (10/06 0933) SpO2:  [84 %-100 %] 93 % (10/06 0933) FiO2 (%):  [55 %] 55 % (10/06 0933)    Weight change: There were no vitals filed for this visit.  Intake/Output:   Intake/Output Summary (Last 24 hours) at 10/04/2019 1032 Last data filed at 10/04/2019 0938 Gross per 24 hour  Intake 340 ml  Output 950 ml  Net -610 ml      Physical Exam    General:  Dyspnea at rest.tearful HEENT: normal Neck: supple. JVP hard to assess but appears elevated. . Carotids  2+ bilat; no bruits. No lymphadenopathy or thyromegaly appreciated. Cor: PMI nondisplaced. Regular rate & rhythm. No rubs, gallops or murmurs. Lungs: Decreased throughout 14 liters face mask Abdomen: soft, nontender, distended. No hepatosplenomegaly. No bruits or masses. Good bowel sounds. Extremities: no cyanosis, clubbing, rash, R and LLE 2-3+ edema with erythema.  Neuro: alert & orientedx3, cranial nerves grossly intact. moves all 4 extremities w/o difficulty. A   Telemetry   SR 90s   EKG   SR 90 bpm   Labs   Basic Metabolic Panel: Recent Labs  Lab 10/03/19 2250 10/04/19 0825  NA 135 137  K 4.3 4.0  CL 86* 88*  CO2 33* 34*  GLUCOSE 333* 196*  BUN 101* 99*  CREATININE 2.52* 2.45*  CALCIUM 9.1 8.8*  MG  --  2.7*    Liver Function Tests: Recent Labs  Lab 10/03/19 2250  10/04/19 0825  AST 19 18  ALT 26 25  ALKPHOS 108 89  BILITOT 0.3 0.6  PROT 7.5 6.5  ALBUMIN 3.5 3.1*   No results for input(s): LIPASE, AMYLASE in the last 168 hours. No results for input(s): AMMONIA in the last 168 hours.  CBC: Recent Labs  Lab 10/03/19 2250 10/04/19 0825  WBC 9.3 8.5  NEUTROABS 8.0* 7.0  HGB 14.1 12.7*  HCT 42.8 40.1  MCV 91.1 93.0  PLT 218 199    Cardiac Enzymes: No results for input(s): CKTOTAL, CKMB, CKMBINDEX, TROPONINI in the last 168 hours.  BNP: BNP (last 3 results) Recent Labs    08/03/19 1040 09/15/19 2138 10/03/19 2250  BNP 70.1 28.6 51.6    ProBNP (last 3 results) No results for input(s): PROBNP in the last 8760 hours.   CBG: Recent Labs  Lab 10/04/19 0755  GLUCAP 183*    Coagulation Studies: Recent Labs    10/03/19 2250  LABPROT 13.4  INR 1.0     Imaging   Dg Chest Port 1 View  Result Date: 10/03/2019 CLINICAL DATA:  Shortness of breath EXAM: PORTABLE CHEST 1 VIEW COMPARISON:  September 15, 2019 FINDINGS: The heart size and mediastinal contours are within normal limits. Again noted is elevation of the right hemidiaphragm. Shallow degree of aeration. Streaky atelectasis seen at both lung bases. IMPRESSION: Shallow degree of aeration with bibasilar segmental atelectasis. Electronically Signed   By: Prudencio Pair M.D.   On: 10/03/2019 23:34      Medications:     Current Medications: . aspirin EC  81 mg Oral Daily  . atorvastatin  20 mg Oral QPM  . enoxaparin (LOVENOX) injection  40 mg Subcutaneous Q24H  . escitalopram  20 mg Oral Daily  . fesoterodine  8 mg Oral QHS  . gabapentin  400 mg Oral TID  . hydrALAZINE  37.5 mg Oral TID  . insulin aspart  0-9 Units Subcutaneous TID WC  . Insulin Glargine  60 Units Subcutaneous QHS  . levothyroxine  175 mcg Oral Q0600  . metolazone  2.5 mg Oral Daily  . [START ON 10/05/2019] multivitamin with minerals  1 tablet Oral Daily  . polyethylene glycol  17 g Oral QHS  .  potassium chloride SA  20 mEq Oral Daily  . pramipexole  0.5-1 mg Oral See admin instructions  . spironolactone  25 mg Oral Daily  . tamsulosin  0.4 mg Oral QHS     Infusions: . azithromycin Stopped (10/04/19 0120)  . cefTRIAXone (ROCEPHIN)  IV Stopped (10/04/19 0046)  . furosemide  Patient Profile   Travis [name] is a 73 y.o. 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 Assessment/Plan   1. A/C Hypoxic Respiratory Failure - multifactorial with  COPD/ diastolic HF Possible CAP.  Remains SOB at rest. On 14 liters oxygen with sats >90%. He is DNI.  - Hopefully will improve with high dose IV diuresis.  -Blood Cx pending. Lactic Acid 1.6  On empiric antibiotics for possible CAP. WBC 8.3   2. A/C Diastolic Heart Failure Most recent ECHO LVEF 60-65% with normal RV.  Significant lower extremity edmea.  - Started on high dose IV lasix. Diuresis picking up.Continue IV lasix 120 mg twice a day + metolazone.   3. CKD Stage IV -Creatinine baseline 2-2.2 -On admit creatinine 2.5. Follow closely.  - Follow closely.   4. LLE Erythema, Possible cellulitis.  On antibiotics.  WBC is not elevated.   5 Hypothyroidism  TSH pending.  Continue levothyroxine.   6. OSA Uses Bipap nightly at home.  7. CAD . Non-obstructive. LHC 08/2015: LM 40, LAD distal 40, LCx ostial 50, RCA proximal 40 - No chest pain. On ASA/statin  -Offb-blocker   8. DMII Will need SSI   9. DNR/DNI    Will need Palliative Care for goals of care. Multiple admits over the last 6 months respiratory failure and volume overload.    Length of Stay: 0  Darrick Grinder, NP  10/04/2019, 10:32 AM  Advanced Heart Failure Team Pager [phone] (M-F; 7a - 4p)  Please contact [facility] for night-coverage after hours (4p -7a ) and weekends on [email or web address]  Patient seen with NP, agree with the above note.   He  returns with acute on chronic hypoxemic respiratory failure.  Has been steadily short of breath over the last few days despite compliance with high dose diuretics at home. Diastolic CHF is complicated by CKD stage IV.  BUN has been markedly elevated recently, around 100.  He was seen in the ER, initially required Bipap.  He was started on Lasix 120 mg IV bid, good UOP and now on facemask.  CXR with atelectasis.  ECG NSR, RBBB.    General: comfortable with face mask.  Neck: Thick, JVP 12+ cm, no thyromegaly or thyroid nodule.  Lungs: Distant BS with end expiratory wheezes CV: Nonpalpable PMI.  Heart regular S1/S2, no S3/S4, no murmur.  2+ edema to knees.  No carotid bruit.  Unable to palpate pedal pulses.  Abdomen: Soft, nontender, no hepatosplenomegaly, moderate distention.  Skin: Erythema bilateral anterior lower legs.  Neurologic: Alert and oriented x 3.  Psych: Normal affect. Extremities: No clubbing or cyanosis.  HEENT: Normal.   1. Acute on chronic hypoxemic respiratory failure: Patient has home O2 (does not use regularly).  He has COPD and diastolic CHF.  Today, he is clearly volume overloaded on exam.  - Lasix 120 mg IV bid, will give metolazone 2.5 mg x 1 or Diuril IV if he cannot come off mask for po.  - Able to wean from Bipap to [facility].  - Cultures sent, covering empirically for PNA (atelectasis on CXR).  2. Acute on chronic diastolic CHF:  He had  PYP scan that was not suggestive of ATTR amyloidosis.  I reviewed his last echo from 7/20, it looks surprisingly unremarkable.  LV EF 60-65% with the RV appearing fairly normal (surprisingly).  There is some septal bounce present, may be due to COPD and elevated  work of breathing but constrictive pericarditis would at least be a consideration.  On exam, he is volume overloaded significantly, NYHA class IV symptoms.   - Lasix 120 mg IV bid, will give metolazone 2.5 mg x 1 or Diuril IV if he cannot come off mask for po.  - Difficult situation, he  has been on torsemide 100 mg bid + daily metolazone at home, really not much room to move with his home regimen. He has not been able to get IV Lasix at home.  Situation is complicated by CKD stage IV.  At this point, he is nearing the need for consideration of dialysis to manage his volume overload, but suspect he would not be a difficult candidate for this. - When his work of breathing is less, would consider limited echo to look for signs of pericardial constriction (vs cMRI).   3. COPD: On home oxygen.  4. CKD: stage IV.  BUN/creatinine at recent baseline.  Will need to follow closely with diuresis.  As above, may need to consider HD eventually for volume management as he is near maximal outpatient diuretic regimen.  5. Bilateral lower leg erythema: Probably venous insufficiency, but covering empirically for cellulitis.   Loralie Champagne 10/04/2019 12:05 PM

## 2019-10-04 NOTE — Progress Notes (Signed)
Patient has BIPAP QHS order. Patient stated he does not want to wear bipap at this time. Patient has home CPAP in room. RT instructed patient to have RT called if he changes his mind. Patient currently on ventimask with spo2 95%. No respiratory distress noted. RN is aware. RT will monitor as needed.

## 2019-10-05 DIAGNOSIS — J9601 Acute respiratory failure with hypoxia: Secondary | ICD-10-CM

## 2019-10-05 LAB — MAGNESIUM: Magnesium: 2.5 mg/dL — ABNORMAL HIGH (ref 1.7–2.4)

## 2019-10-05 LAB — GLUCOSE, CAPILLARY
Glucose-Capillary: 231 mg/dL — ABNORMAL HIGH (ref 70–99)
Glucose-Capillary: 249 mg/dL — ABNORMAL HIGH (ref 70–99)
Glucose-Capillary: 373 mg/dL — ABNORMAL HIGH (ref 70–99)
Glucose-Capillary: 427 mg/dL — ABNORMAL HIGH (ref 70–99)

## 2019-10-05 LAB — URINE CULTURE: Culture: >=100000 — AB

## 2019-10-05 LAB — BASIC METABOLIC PANEL
Anion gap: 15 (ref 5–15)
BUN: 89 mg/dL — ABNORMAL HIGH (ref 8–23)
CO2: 37 mmol/L — ABNORMAL HIGH (ref 22–32)
Calcium: 9.1 mg/dL (ref 8.9–10.3)
Chloride: 85 mmol/L — ABNORMAL LOW (ref 98–111)
Creatinine, Ser: 2.39 mg/dL — ABNORMAL HIGH (ref 0.61–1.24)
GFR calc Af Amer: 30 mL/min — ABNORMAL LOW (ref >60)
GFR calc non Af Amer: 26 mL/min — ABNORMAL LOW (ref >60)
Glucose, Bld: 318 mg/dL — ABNORMAL HIGH (ref 70–99)
Potassium: 4.3 mmol/L (ref 3.5–5.1)
Sodium: 137 mmol/L (ref 135–145)

## 2019-10-05 LAB — PROCALCITONIN: Procalcitonin: <0.1 ng/mL

## 2019-10-05 MED ORDER — INSULIN ASPART 100 UNIT/ML ~~LOC~~ SOLN
8.0000 [IU] | Freq: Once | SUBCUTANEOUS | Status: AC
  Administered 2019-10-05: 8 [IU] via SUBCUTANEOUS

## 2019-10-05 MED ORDER — METHYLPREDNISOLONE SODIUM SUCC 40 MG IJ SOLR
40.0000 mg | Freq: Two times a day (BID) | INTRAMUSCULAR | Status: DC
  Administered 2019-10-05 – 2019-10-12 (×15): 40 mg via INTRAVENOUS
  Filled 2019-10-05 (×15): qty 1

## 2019-10-05 MED ORDER — ALBUTEROL SULFATE (2.5 MG/3ML) 0.083% IN NEBU: 2.5000 mg | INHALATION_SOLUTION | RESPIRATORY_TRACT | Status: DC | PRN

## 2019-10-05 MED ORDER — ALPRAZOLAM 0.5 MG PO TABS
0.5000 mg | ORAL_TABLET | Freq: Once | ORAL | Status: AC
  Administered 2019-10-05: 0.5 mg via ORAL
  Filled 2019-10-05: qty 1

## 2019-10-05 MED ORDER — INSULIN ASPART 100 UNIT/ML ~~LOC~~ SOLN
0.0000 [IU] | Freq: Three times a day (TID) | SUBCUTANEOUS | Status: DC
  Administered 2019-10-05: 9 [IU] via SUBCUTANEOUS
  Administered 2019-10-05: 3 [IU] via SUBCUTANEOUS
  Administered 2019-10-06 – 2019-10-07 (×3): 9 [IU] via SUBCUTANEOUS

## 2019-10-05 MED ORDER — IPRATROPIUM-ALBUTEROL 0.5-2.5 (3) MG/3ML IN SOLN
3.0000 mL | RESPIRATORY_TRACT | Status: DC | PRN
  Administered 2019-10-09 – 2019-10-11 (×2): 3 mL via RESPIRATORY_TRACT
  Filled 2019-10-05: qty 3

## 2019-10-05 MED ORDER — SODIUM CHLORIDE 0.9 % IV SOLN
100.0000 mg | Freq: Two times a day (BID) | INTRAVENOUS | Status: AC
  Administered 2019-10-05 – 2019-10-10 (×12): 100 mg via INTRAVENOUS
  Filled 2019-10-05 (×12): qty 100

## 2019-10-05 MED ORDER — SODIUM CHLORIDE 0.9 % IV SOLN: 100.0000 mg | Freq: Two times a day (BID) | INTRAVENOUS | Status: DC

## 2019-10-05 MED ORDER — SODIUM CHLORIDE 0.9 % IV SOLN: 1.0000 g | INTRAVENOUS | Status: DC

## 2019-10-05 MED ORDER — INSULIN ASPART 100 UNIT/ML ~~LOC~~ SOLN: 0.0000 [IU] | Freq: Every day | SUBCUTANEOUS | Status: DC

## 2019-10-05 MED ORDER — ENOXAPARIN SODIUM 80 MG/0.8ML ~~LOC~~ SOLN
70.0000 mg | SUBCUTANEOUS | Status: DC
  Administered 2019-10-05 – 2019-10-11 (×7): 70 mg via SUBCUTANEOUS
  Filled 2019-10-05 (×7): qty 0.8

## 2019-10-05 MED ORDER — ALBUTEROL SULFATE (2.5 MG/3ML) 0.083% IN NEBU
2.5000 mg | INHALATION_SOLUTION | RESPIRATORY_TRACT | Status: DC
  Administered 2019-10-05: 2.5 mg via RESPIRATORY_TRACT
  Filled 2019-10-05: qty 3

## 2019-10-05 MED ORDER — INSULIN ASPART 100 UNIT/ML ~~LOC~~ SOLN
3.0000 [IU] | Freq: Three times a day (TID) | SUBCUTANEOUS | Status: DC
  Administered 2019-10-05 – 2019-10-06 (×5): 3 [IU] via SUBCUTANEOUS

## 2019-10-05 MED ORDER — INSULIN GLARGINE 100 UNIT/ML ~~LOC~~ SOLN
20.0000 [IU] | Freq: Two times a day (BID) | SUBCUTANEOUS | Status: DC
  Administered 2019-10-05 (×2): 20 [IU] via SUBCUTANEOUS
  Filled 2019-10-05 (×4): qty 0.2

## 2019-10-05 MED ORDER — IPRATROPIUM-ALBUTEROL 0.5-2.5 (3) MG/3ML IN SOLN
3.0000 mL | RESPIRATORY_TRACT | Status: DC
  Administered 2019-10-05 – 2019-10-10 (×30): 3 mL via RESPIRATORY_TRACT
  Filled 2019-10-05 (×31): qty 3

## 2019-10-05 NOTE — Progress Notes (Signed)
[facility] PROGRESS NOTE    Progress Note  Travis [name]  [id number] DOB: 09-May-1946 DOA: 10/03/2019 PCP: [name], MD     Brief Narrative:   Travis [name] is an 73 y.o. male past medical history of chronic diastolic heart failure, diabetes mellitus type 2, COPD essential hypertension obesity hypoventilation syndrome BiPAP, chronic kidney disease with a baseline creatinine around 2 with a recent admission for heart failure,comes in for shortness of breath in the ED he was found to be hypoxic requiring a nonrebreather chest x-ray showed bilateral atelectasis COVID-19 PCR was negative, on exam he has JVD with lower extremity edema.  Assessment/Plan:   Acute respiratory failure with hypoxia due to chronic diastolic heart failure and possibly due to acute COPD exacerbation.: The advanced heart failure team was consulted. Travis [name]  He is currently a DNI. He was started on high dose IV Lasix and empiric antibiotics (cefepime) for possible community-acquired pneumonia, he has remained afebrile with no leukocytosis. Good urine output about 4 L he is negative about 2 L over the past 48 hours.  Continue IV Lasix and metolazone.  Noted on physical exam he does have lower extremity edema and JVD. He still appears that he is in respiratory distress she is requiring 7 L check keep saturations above 95%. On physical exam he has significant decrease in his air movement, with a significantly long expiratory phase.  But no wheezing, with a history of COPD I think he may have a COPD exacerbation will start him on IV steroids schedule inhalers, and we will change his antibiotics to IV doxycycline this should cover for a possible lower extremity cellulitis. We will check a procalcitonin.  Acute kidney injury on chronic kidney disease stage III with baseline creatinine around 2. Has had good urine output on IV Lasix and metolazone, Aldactone has been on hold due to worsening creatinine. Continue  daily basic metabolic panels replete electrolytes as needed. Creatinine has plateaued.  Diabetes mellitus type 2 insulin-dependent: Continue long-acting insulin plus sliding scale. He is now that were starting steroids and his blood glucoses been running greater than 200 will increase his long-acting insulin.  Hyperlipidemia: Continue statin.  Depression: Continue Lexapro.  Hypothyroidism: Continue Synthroid.  Essential hypertension: Continue IV Lasix Obesity hypoventilation syndrome chronic lung continue BiPAP at night.  Pressure ulcer stage II present on admission. RN Pressure Injury Documentation: Pressure Ulcer 07/27/14 Stage II -  Partial thickness loss of dermis presenting as a shallow open ulcer with a red, pink wound bed without slough. (Active)  07/27/14 1000  Location: Sacrum  Location Orientation:   Staging: Stage II -  Partial thickness loss of dermis presenting as a shallow open ulcer with a red, pink wound bed without slough.  Wound Description (Comments):   Present on Admission:      Pressure Injury 07/02/18 (Active)  07/02/18 1900  Location: Nose  Location Orientation: Anterior  Staging:   Wound Description (Comments):   Present on Admission:      Pressure Injury 11/21/18 Stage III -  Full thickness tissue loss. Subcutaneous fat may be visible but bone, tendon or muscle are NOT exposed. Full thicknes small open area to sacrum. (Active)  11/21/18 0005  Location: Sacrum  Location Orientation:   Staging: Stage III -  Full thickness tissue loss. Subcutaneous fat may be visible but bone, tendon or muscle are NOT exposed.  Wound Description (Comments): Full thicknes small open area to sacrum.  Present on Admission: Yes     Pressure  Injury 05/01/19 Stage II -  Partial thickness loss of dermis presenting as a shallow open ulcer with a red, pink wound bed without slough. (Active)  05/01/19 0615  Location: Sacrum  Location Orientation: Mid  Staging: Stage II -   Partial thickness loss of dermis presenting as a shallow open ulcer with a red, pink wound bed without slough.  Wound Description (Comments):   Present on Admission: Yes     Pressure Injury 09/16/19 Sacrum Stage II -  Partial thickness loss of dermis presenting as a shallow open ulcer with a red, pink wound bed without slough. yellow (Active)  09/16/19 1608  Location: Sacrum  Location Orientation:   Staging: Stage II -  Partial thickness loss of dermis presenting as a shallow open ulcer with a red, pink wound bed without slough.  Wound Description (Comments): yellow  Present on Admission: Yes    Estimated body mass index is 40.6 kg/m as calculated from the following:   Height as of this encounter: 6\' 1"  (1.854 m).   Weight as of this encounter: 139.6 kg.   DVT prophylaxis: lovenxo Family Communication:none Disposition Plan/Barrier to D/C: unable to determine Code Status:     Code Status Orders  (From admission, onward)         Start     Ordered   10/04/19 0540  Do not attempt resuscitation (DNR)  Continuous    Question Answer Comment  In the event of cardiac or respiratory ARREST Do not call a "code blue"   In the event of cardiac or respiratory ARREST Do not perform Intubation, CPR, defibrillation or ACLS   In the event of cardiac or respiratory ARREST Use medication by any route, position, wound care, and other measures to relive pain and suffering. May use oxygen, suction and manual treatment of airway obstruction as needed for comfort.      10/04/19 0541        Code Status History    Date Active Date Inactive Code Status Order ID Comments User Context   10/04/2019 0449 10/04/2019 0541 DNR [id number]  Rise Patience, MD ED   09/16/2019 0059 09/17/2019 1808 Full Code [id number]  Elwyn Reach, MD ED   07/21/2019 0308 07/22/2019 0016 Full Code [id number]  Toy Baker, MD Inpatient   07/13/2019 2202 07/18/2019 1531 Full Code [id number]  Rise Patience, MD  Inpatient   05/01/2019 0347 05/11/2019 1715 Full Code [id number]  Ivor Costa, MD ED   11/20/2018 1848 11/22/2018 1844 Full Code [id number]  Cristal Deer, MD Inpatient   06/28/2018 2146 07/04/2018 1625 Full Code [id number]  Vianne Bulls, MD ED   04/16/2018 1515 05/12/2018 1931 Full Code [id number]  [name], [name] Inpatient   04/16/2018 1515 04/16/2018 1515 Full Code [id number]  [name], [name] Inpatient   04/08/2018 1733 04/16/2018 1504 Full Code [id number]  Eustace Moore, MD Inpatient   11/12/2017 1404 11/13/2017 0310 Full Code [id number]  Logan Bores, MD HOV   10/08/2017 1547 10/20/2017 1413 Full Code [id number]  Bary Leriche, [name] Inpatient   10/02/2017 1549 10/08/2017 1539 Full Code [id number]  Kinnie Feil, MD ED   03/26/2017 2315 04/01/2017 1622 Full Code [id number]  Norval Morton, MD Inpatient   09/07/2015 1230 09/07/2015 1956 Full Code [id number]  Martinique, Peter M, MD Inpatient   08/19/2015 2101 08/23/2015 1628 Full Code [id number]  Theressa Millard, MD Inpatient   07/18/2014 1825 08/09/2014 1422 Full Code [id number]  Love, Ivan Anchors, [name] Inpatient  07/10/2014 1643 07/18/2014 1825 Full Code [id number]  Jonetta Osgood, MD Inpatient   Advance Care Planning Activity        IV Access:    Peripheral IV   Procedures and diagnostic studies:   Dg Chest Port 1 View  Result Date: 10/03/2019 CLINICAL DATA:  Shortness of breath EXAM: PORTABLE CHEST 1 VIEW COMPARISON:  September 15, 2019 FINDINGS: The heart size and mediastinal contours are within normal limits. Again noted is elevation of the right hemidiaphragm. Shallow degree of aeration. Streaky atelectasis seen at both lung bases. IMPRESSION: Shallow degree of aeration with bibasilar segmental atelectasis. Electronically Signed   By: Prudencio Pair M.D.   On: 10/03/2019 23:34     Medical Consultants:    None.  Anti-Infectives:   doxy  Subjective:    Kelten [place] he relates his breathing is about the same he  continues to not able to lay flat to sleep.  Objective:    Vitals:   10/04/19 2300 10/05/19 0357 10/05/19 0406 10/05/19 0851  BP:  (!) 131/57  (!) 151/70  Pulse:  90    Resp: 20 (!) 28    Temp:  97.8 F (36.6 C)    TempSrc:  Oral    SpO2: 90% 93%    Weight:   (!) 139.6 kg   Height:       SpO2: 93 % O2 Flow Rate (L/min): 6 L/min FiO2 (%): 55 %   Intake/Output Summary (Last 24 hours) at 10/05/2019 0910 Last data filed at 10/05/2019 0829 Gross per 24 hour  Intake 2078 ml  Output 3750 ml  Net -1672 ml   Filed Weights   10/04/19 2036 10/05/19 0406  Weight: (!) 138.8 kg (!) 139.6 kg    Exam: General exam: In no acute distress. Respiratory system: Poor air movement with crackles at the right lung base. Cardiovascular system: Positive S1-S2 no murmurs rubs gallops no JVD. Gastrointestinal system: Abdomen is nondistended, soft and nontender.  Central nervous system: Alert and oriented. No focal neurological deficits. Extremities: No pedal edema. Skin: No rashes, lesions or ulcers Psychiatry: Judgement and insight appear normal. Mood & affect appropriate.   Data Reviewed:    Labs: Basic Metabolic Panel: Recent Labs  Lab 10/03/19 2250 10/04/19 0825 10/04/19 1125  NA 135 137 136  K 4.3 4.0 3.8  CL 86* 88*  --   CO2 33* 34*  --   GLUCOSE 333* 196*  --   BUN 101* 99*  --   CREATININE 2.52* 2.45*  --   CALCIUM 9.1 8.8*  --   MG  --  2.7*  --    GFR Estimated Creatinine Clearance: 38.8 mL/min (A) (by C-G formula based on SCr of 2.45 mg/dL (H)). Liver Function Tests: Recent Labs  Lab 10/03/19 2250 10/04/19 0825  AST 19 18  ALT 26 25  ALKPHOS 108 89  BILITOT 0.3 0.6  PROT 7.5 6.5  ALBUMIN 3.5 3.1*   No results for input(s): LIPASE, AMYLASE in the last 168 hours. No results for input(s): AMMONIA in the last 168 hours. Coagulation profile Recent Labs  Lab 10/03/19 2250  INR 1.0   COVID-19 Labs  Recent Labs    10/04/19 0108  DDIMER 0.59*    Lab  Results  Component Value Date   SARSCOV2NAA NEGATIVE 10/03/2019   SARSCOV2NAA NEGATIVE 09/15/2019   [place] NEGATIVE 07/20/2019   [place] NEGATIVE 07/13/2019    CBC: Recent Labs  Lab 10/03/19 2250 10/04/19 0825 10/04/19 1125  WBC  9.3 8.5  --   NEUTROABS 8.0* 7.0  --   HGB 14.1 12.7* 12.9*  HCT 42.8 40.1 38.0*  MCV 91.1 93.0  --   PLT 218 199  --    Cardiac Enzymes: No results for input(s): CKTOTAL, CKMB, CKMBINDEX, TROPONINI in the last 168 hours. BNP (last 3 results) No results for input(s): PROBNP in the last 8760 hours. CBG: Recent Labs  Lab 10/04/19 0755 10/04/19 1329 10/04/19 1657 10/04/19 2145 10/05/19 0725  GLUCAP 183* 214* 199* 210* 231*   D-Dimer: Recent Labs    10/04/19 0108  DDIMER 0.59*   Hgb A1c: No results for input(s): HGBA1C in the last 72 hours. Lipid Profile: No results for input(s): CHOL, HDL, LDLCALC, TRIG, CHOLHDL, LDLDIRECT in the last 72 hours. Thyroid function studies: Recent Labs    10/04/19 0826  TSH 1.897   Anemia work up: No results for input(s): VITAMINB12, FOLATE, FERRITIN, TIBC, IRON, RETICCTPCT in the last 72 hours. Sepsis Labs: Recent Labs  Lab 10/03/19 2250 10/03/19 2325 10/04/19 0825  WBC 9.3  --  8.5  LATICACIDVEN  --  1.6  --    Microbiology Recent Results (from the past 240 hour(s))  SARS Coronavirus 2 [facility] order, Performed in [facility] hospital lab) Nasopharyngeal Nasopharyngeal Swab     Status: None   Collection Time: 10/03/19 11:27 PM   Specimen: Nasopharyngeal Swab  Result Value Ref Range Status   SARS Coronavirus 2 NEGATIVE NEGATIVE Final    Comment: (NOTE) If result is NEGATIVE SARS-CoV-2 target nucleic acids are NOT DETECTED. The SARS-CoV-2 RNA is generally detectable in upper and lower  respiratory specimens during the acute phase of infection. The lowest  concentration of SARS-CoV-2 viral copies this assay can detect is 250  copies / mL. A negative result does not preclude  SARS-CoV-2 infection  and should not be used as the sole basis for treatment or other  patient management decisions.  A negative result may occur with  improper specimen collection / handling, submission of specimen other  than nasopharyngeal swab, presence of viral mutation(s) within the  areas targeted by this assay, and inadequate number of viral copies  (<250 copies / mL). A negative result must be combined with clinical  observations, patient history, and epidemiological information. If result is POSITIVE SARS-CoV-2 target nucleic acids are DETECTED. The SARS-CoV-2 RNA is generally detectable in upper and lower  respiratory specimens dur ing the acute phase of infection.  Positive  results are indicative of active infection with SARS-CoV-2.  Clinical  correlation with patient history and other diagnostic information is  necessary to determine patient infection status.  Positive results do  not rule out bacterial infection or co-infection with other viruses. If result is PRESUMPTIVE POSTIVE SARS-CoV-2 nucleic acids MAY BE PRESENT.   A presumptive positive result was obtained on the submitted specimen  and confirmed on repeat testing.  While 2019 novel coronavirus  (SARS-CoV-2) nucleic acids may be present in the submitted sample  additional confirmatory testing may be necessary for epidemiological  and / or clinical management purposes  to differentiate between  SARS-CoV-2 and other Sarbecovirus currently known to infect humans.  If clinically indicated additional testing with an alternate test  methodology [id number]) is advised. The SARS-CoV-2 RNA is generally  detectable in upper and lower respiratory sp ecimens during the acute  phase of infection. The expected result is Negative. Fact Sheet for Patients:  StrictlyIdeas.no Fact Sheet for Healthcare Providers: BankingDealers.co.za This test is not yet approved or  cleared by the  [place] and has been authorized for detection and/or diagnosis of SARS-CoV-2 by FDA under an Emergency Use Authorization (EUA).  This EUA will remain in effect (meaning this test can be used) for the duration of the COVID-19 declaration under Section 564(b)(1) of the Act, 21 U.S.C. section 360bbb-3(b)(1), unless the authorization is terminated or revoked sooner. Performed at [facility], [place] [address], [place], [place] [address]   Blood Culture (routine x 2)     Status: None (Preliminary result)   Collection Time: 10/03/19 11:30 PM   Specimen: BLOOD  Result Value Ref Range Status   Specimen Description BLOOD  Final   Special Requests   Final    BOTTLES DRAWN AEROBIC AND ANAEROBIC Blood Culture adequate volume   Culture   Final    NO GROWTH < 12 HOURS Performed at [facility], [place] [address] [place], [place] [address]    Report Status PENDING  Incomplete  Blood Culture (routine x 2)     Status: None (Preliminary result)   Collection Time: 10/03/19 11:35 PM   Specimen: BLOOD LEFT ARM  Result Value Ref Range Status   Specimen Description BLOOD LEFT ARM  Final   Special Requests   Final    BOTTLES DRAWN AEROBIC AND ANAEROBIC Blood Culture adequate volume   Culture   Final    NO GROWTH < 12 HOURS Performed at [facility], [place] [address], [place], [place] [address]    Report Status PENDING  Incomplete  Urine culture     Status: Abnormal (Preliminary result)   Collection Time: 10/04/19 12:18 AM   Specimen: In/Out Cath Urine  Result Value Ref Range Status   Specimen Description IN/OUT CATH URINE  Final   Special Requests NONE  Final   Culture (A)  Final    >=100,000 COLONIES/mL GRAM NEGATIVE RODS IDENTIFICATION AND SUSCEPTIBILITIES TO FOLLOW Performed at [facility], [place] [address] [place], [place] [address]    Report Status PENDING  Incomplete     Medications:   . aspirin EC  81 mg Oral Daily  . atorvastatin  20 mg Oral QPM  .  Chlorhexidine Gluconate Cloth  6 each Topical Daily  . enoxaparin (LOVENOX) injection  40 mg Subcutaneous Q24H  . escitalopram  20 mg Oral Daily  . fesoterodine  8 mg Oral QHS  . gabapentin  400 mg Oral TID  . hydrALAZINE  37.5 mg Oral TID  . insulin aspart  0-9 Units Subcutaneous TID WC  . insulin aspart  8 Units Subcutaneous TID WC  . insulin glargine  26 Units Subcutaneous QHS  . levothyroxine  175 mcg Oral Q0600  . metolazone  2.5 mg Oral Daily  . multivitamin with minerals  1 tablet Oral Daily  . polyethylene glycol  17 g Oral QHS  . potassium chloride SA  20 mEq Oral Daily  . pramipexole  0.5 mg Oral Daily  . pramipexole  1 mg Oral QHS  . spironolactone  25 mg Oral Daily  . tamsulosin  0.4 mg Oral QHS   Continuous Infusions: . azithromycin Stopped (10/05/19 0006)  . ceFEPime (MAXIPIME) IV Stopped (10/05/19 0127)  . furosemide Stopped (10/05/19 0609)      LOS: 1 day   Charlynne Cousins  [facility]  10/05/2019, 9:10 AM

## 2019-10-05 NOTE — Progress Notes (Addendum)
Advanced Heart Failure Rounding Note  PCP-Cardiologist: Mertie Moores, MD   Subjective:    Admitted with marked volume overload and respiratory distress.   Remains SOB at rest. Want something to drink.   Objective:   Weight Range: (!) 139.6 kg Body mass index is 40.6 kg/m.   Vital Signs:   Temp:  [97.6 F (36.4 C)-97.8 F (36.6 C)] 97.8 F (36.6 C) (10/07 0357) Pulse Rate:  [80-90] 90 (10/07 0357) Resp:  [20-34] 28 (10/07 0357) BP: (121-151)/(57-75) 151/70 (10/07 0851) SpO2:  [89 %-95 %] 93 % (10/07 0357) Weight:  [138.8 kg-139.6 kg] 139.6 kg (10/07 0406) Last BM Date: 10/04/19(small bowel movement this morning per patient )  Weight change: Filed Weights   10/04/19 2036 10/05/19 0406  Weight: (!) 138.8 kg (!) 139.6 kg    Intake/Output:   Intake/Output Summary (Last 24 hours) at 10/05/2019 1210 Last data filed at 10/05/2019 0900 Gross per 24 hour  Intake 2438 ml  Output 3750 ml  Net -1312 ml      Physical Exam    General:  Dyspneic talking.  HEENT: Normal Neck: Supple. JVP 11-12  . Carotids 2+ bilat; no bruits. No lymphadenopathy or thyromegaly appreciated. Cor: PMI nondisplaced. Regular rate & rhythm. No rubs, gallops or murmurs. Lungs: Decreased  Abdomen: Soft, nontender, nondistended. No hepatosplenomegaly. No bruits or masses. Good bowel sounds. Extremities: No cyanosis, clubbing, rash, R and LLE 2+ edema Neuro: Alert & orientedx3, cranial nerves grossly intact. moves all 4 extremities w/o difficulty. Affect flat   Telemetry  SR 80s personally reviewed.   EKG    [name]   Labs    CBC Recent Labs    10/03/19 2250 10/04/19 0825 10/04/19 1125  WBC 9.3 8.5  --   NEUTROABS 8.0* 7.0  --   HGB 14.1 12.7* 12.9*  HCT 42.8 40.1 38.0*  MCV 91.1 93.0  --   PLT 218 199  --    Basic Metabolic Panel Recent Labs    10/04/19 0825 10/04/19 1125 10/05/19 0926  NA 137 136 137  K 4.0 3.8 4.3  CL 88*  --  85*  CO2 34*  --  37*  GLUCOSE 196*  --   318*  BUN 99*  --  89*  CREATININE 2.45*  --  2.39*  CALCIUM 8.8*  --  9.1  MG 2.7*  --  2.5*   Liver Function Tests Recent Labs    10/03/19 2250 10/04/19 0825  AST 19 18  ALT 26 25  ALKPHOS 108 89  BILITOT 0.3 0.6  PROT 7.5 6.5  ALBUMIN 3.5 3.1*   No results for input(s): LIPASE, AMYLASE in the last 72 hours. Cardiac Enzymes No results for input(s): CKTOTAL, CKMB, CKMBINDEX, TROPONINI in the last 72 hours.  BNP: BNP (last 3 results) Recent Labs    08/03/19 1040 09/15/19 2138 10/03/19 2250  BNP 70.1 28.6 51.6    ProBNP (last 3 results) No results for input(s): PROBNP in the last 8760 hours.   D-Dimer Recent Labs    10/04/19 0108  DDIMER 0.59*   Hemoglobin A1C No results for input(s): HGBA1C in the last 72 hours. Fasting Lipid Panel No results for input(s): CHOL, HDL, LDLCALC, TRIG, CHOLHDL, LDLDIRECT in the last 72 hours. Thyroid Function Tests Recent Labs    10/04/19 0826  TSH 1.897    Other results:   Imaging     No results found.   Medications:     Scheduled Medications: . aspirin EC  81 mg Oral Daily  . atorvastatin  20 mg Oral QPM  . Chlorhexidine Gluconate Cloth  6 each Topical Daily  . enoxaparin (LOVENOX) injection  70 mg Subcutaneous Q24H  . escitalopram  20 mg Oral Daily  . fesoterodine  8 mg Oral QHS  . gabapentin  400 mg Oral TID  . hydrALAZINE  37.5 mg Oral TID  . insulin aspart  0-5 Units Subcutaneous QHS  . insulin aspart  0-9 Units Subcutaneous TID WC  . insulin aspart  3 Units Subcutaneous TID WC  . insulin glargine  20 Units Subcutaneous BID  . levothyroxine  175 mcg Oral Q0600  . methylPREDNISolone (SOLU-MEDROL) injection  40 mg Intravenous Q12H  . metolazone  2.5 mg Oral Daily  . multivitamin with minerals  1 tablet Oral Daily  . polyethylene glycol  17 g Oral QHS  . potassium chloride SA  20 mEq Oral Daily  . pramipexole  0.5 mg Oral Daily  . pramipexole  1 mg Oral QHS  . spironolactone  25 mg Oral Daily  .  tamsulosin  0.4 mg Oral QHS     Infusions: . doxycycline (VIBRAMYCIN) IV    . furosemide 120 mg (10/05/19 1043)     PRN Medications:  acetaminophen **OR** acetaminophen, ondansetron **OR** ondansetron (ZOFRAN) IV    Assessment/Plan   1. A/C Hypoxic Respiratory Failure - multifactorial with  COPD/ diastolic HF Possible CAP.  Remains SOB at rest. Oxygen has been weaned to 6 liters.  He is DNI.  - Hopefully will improve with high dose IV diuresis.  -Blood Cx - NGTD Lactic Acid 1.6  On empiric antibiotics for possible CAP. Stopped on     2. A/C Diastolic Heart Failure Most recent ECHO LVEF 60-65% with normal RV.  Significant lower extremity edmea.  - Continue IV lasix 120 mg twice a day + metolazone.  - Renal function stable. Limit fluid intake to < liters   3. CKD Stage IV -Creatinine baseline 2-2.2 -On admit creatinine 2.5. - Creatinine today 2.3    4. LLE Erythema, Possible cellulitis.  On antibiotics.  WBC is not elevated.   5 Hypothyroidism  TSH 1.9  Continue levothyroxine.   6. OSA Uses Bipap nightly at home.  7. CAD . Non-obstructive. LHC 08/2015: LM 40, LAD distal 40, LCx ostial 50, RCA proximal 40 - No chest pain - On ASA/statin  -Offb-blocker   8. DMII Will need SSI   9. DNR/DNI   10. UTI Urine CX- GNR with final speciation pending. On IV doxycycline.  Per primary team.   Length of Stay: 1  Amy Clegg, NP  10/05/2019, 12:10 PM  Advanced Heart Failure Team Pager [phone] (M-F; [place])  Please contact [facility] for night-coverage after hours (4p -7a ) and weekends on [email or web address]  Patient seen and examined with Darrick Grinder, NP. We discussed all aspects of the encounter. I agree with the assessment and plan as stated above.   His respiratory status remains very tenuous. On BIPAP. On IV lasix and metolazone. Diuresing well but still not much improvement. Creatinine stable. Also being treated for COPD flare. Bcx remains negative.    On exam On bipap very dyspneic JVP up  Cor RRR Lungs coarse decreased air movement Ab markedly obese Ext 1-2 + edema  Will continue IV diuresis and treatment for COPD flare. I am increasingly concerned about his prognosis. He has affirmed DNR/DNI status.   Glori Bickers, MD  9:54 PM

## 2019-10-05 NOTE — Consult Note (Signed)
[place] Nurse wound consult note Patient receiving care in [place]. 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  This has created a wound that measures 1.5 cm x 1 cm.  The upper area closest to the coccyx is yellow.  Everything else is wet and pink.  There is no odor, no drainage, no dressing at the time of my eval.  This area will be difficult to manage secondary to the location and the depth of the gluteal fold; the body habitus is quite large.  The patient admits to sitting for extended periods in a recliner when at home. Monitor the wound area(s) for worsening of condition such as: Signs/symptoms of infection,  Increase in size,  Development of or worsening of odor, Development of pain, or increased pain at the affected locations.  Notify the medical team if any of these develop.  Thank you for the consult.  Discussed plan of care with the patient.  [place] nurse will not follow at this time.  Please re-consult the [place] team if needed.  Val Riles, RN, MSN, CWOCN, CNS-BC, pager [phone]

## 2019-10-05 NOTE — Progress Notes (Addendum)
Patient, wife, and daughter in law all state that patient's code status of DNR is not correct. Patient DOES wish to be intubated if he needs to be. RT has called RN with this information and paged Dr Venetia Constable to inform him as well. The patient is currently on bipap and tolerating well at this time.

## 2019-10-06 ENCOUNTER — Encounter (HOSPITAL_COMMUNITY): Payer: PPO

## 2019-10-06 LAB — BASIC METABOLIC PANEL
Anion gap: 12 (ref 5–15)
BUN: 92 mg/dL — ABNORMAL HIGH (ref 8–23)
CO2: 35 mmol/L — ABNORMAL HIGH (ref 22–32)
Calcium: 8.9 mg/dL (ref 8.9–10.3)
Chloride: 88 mmol/L — ABNORMAL LOW (ref 98–111)
Creatinine, Ser: 2.56 mg/dL — ABNORMAL HIGH (ref 0.61–1.24)
GFR calc Af Amer: 27 mL/min — ABNORMAL LOW (ref >60)
GFR calc non Af Amer: 24 mL/min — ABNORMAL LOW (ref >60)
Glucose, Bld: 378 mg/dL — ABNORMAL HIGH (ref 70–99)
Potassium: 4.3 mmol/L (ref 3.5–5.1)
Sodium: 135 mmol/L (ref 135–145)

## 2019-10-06 LAB — RESPIRATORY PANEL BY PCR

## 2019-10-06 LAB — GLUCOSE, CAPILLARY
Glucose-Capillary: 365 mg/dL — ABNORMAL HIGH (ref 70–99)
Glucose-Capillary: 440 mg/dL — ABNORMAL HIGH (ref 70–99)
Glucose-Capillary: 360 mg/dL — ABNORMAL HIGH (ref 70–99)
Glucose-Capillary: 439 mg/dL — ABNORMAL HIGH (ref 70–99)

## 2019-10-06 LAB — CBC
HCT: 38 % — ABNORMAL LOW (ref 39.0–52.0)
Hemoglobin: 12.1 g/dL — ABNORMAL LOW (ref 13.0–17.0)
MCH: 29.1 pg (ref 26.0–34.0)
MCHC: 31.8 g/dL (ref 30.0–36.0)
MCV: 91.3 fL (ref 80.0–100.0)
Platelets: 179 10*3/uL (ref 150–400)
RBC: 4.16 MIL/uL — ABNORMAL LOW (ref 4.22–5.81)
RDW: 15.9 % — ABNORMAL HIGH (ref 11.5–15.5)
WBC: 7.4 10*3/uL (ref 4.0–10.5)
nRBC: 0 % (ref 0.0–0.2)

## 2019-10-06 LAB — MAGNESIUM: Magnesium: 2.4 mg/dL (ref 1.7–2.4)

## 2019-10-06 LAB — PROCALCITONIN: Procalcitonin: <0.1 ng/mL

## 2019-10-06 MED ORDER — INSULIN GLARGINE 100 UNIT/ML ~~LOC~~ SOLN
35.0000 [IU] | Freq: Two times a day (BID) | SUBCUTANEOUS | Status: DC
  Administered 2019-10-06: 35 [IU] via SUBCUTANEOUS
  Filled 2019-10-06 (×2): qty 0.35

## 2019-10-06 MED ORDER — SODIUM CHLORIDE 0.9 % IV SOLN
INTRAVENOUS | Status: DC | PRN
  Administered 2019-10-06: 11:00:00 20 mL via INTRAVENOUS
  Administered 2019-10-09 (×2): 250 mL via INTRAVENOUS

## 2019-10-06 MED ORDER — INSULIN ASPART 100 UNIT/ML ~~LOC~~ SOLN
10.0000 [IU] | Freq: Once | SUBCUTANEOUS | Status: AC
  Administered 2019-10-06: 10 [IU] via SUBCUTANEOUS

## 2019-10-06 MED ORDER — INSULIN ASPART 100 UNIT/ML ~~LOC~~ SOLN
8.0000 [IU] | Freq: Three times a day (TID) | SUBCUTANEOUS | Status: DC
  Administered 2019-10-07 (×2): 8 [IU] via SUBCUTANEOUS

## 2019-10-06 MED ORDER — INSULIN GLARGINE 100 UNIT/ML ~~LOC~~ SOLN
45.0000 [IU] | Freq: Two times a day (BID) | SUBCUTANEOUS | Status: DC
  Administered 2019-10-06 – 2019-10-07 (×2): 45 [IU] via SUBCUTANEOUS
  Filled 2019-10-06 (×3): qty 0.45

## 2019-10-06 MED ORDER — INSULIN ASPART 100 UNIT/ML ~~LOC~~ SOLN
15.0000 [IU] | Freq: Once | SUBCUTANEOUS | Status: AC
  Administered 2019-10-06: 15 [IU] via SUBCUTANEOUS

## 2019-10-06 MED ORDER — SODIUM CHLORIDE 0.9 % IV SOLN
1.0000 g | Freq: Every day | INTRAVENOUS | Status: DC
  Administered 2019-10-06 – 2019-10-10 (×5): 1 g via INTRAVENOUS
  Filled 2019-10-06 (×6): qty 1

## 2019-10-06 NOTE — Progress Notes (Signed)
[facility] PROGRESS NOTE    Progress Note  Travis [name]  [id number] DOB: Apr 23, 1946 DOA: 10/03/2019 PCP: [name], MD     Brief Narrative:   Travis [name] is an 73 y.o. male past medical history of chronic diastolic heart failure, diabetes mellitus type 2, COPD essential hypertension obesity hypoventilation syndrome BiPAP, chronic kidney disease with a baseline creatinine around 2 with a recent admission for heart failure,comes in for shortness of breath in the ED he was found to be hypoxic requiring a nonrebreather chest x-ray showed bilateral atelectasis COVID-19 PCR was negative, on exam he has JVD with lower extremity edema.  Assessment/Plan:   Acute respiratory failure with hypoxia due to chronic diastolic heart failure and possibly due to acute COPD exacerbation.: Patient yesterday got changed to a full code as per family request. Continue IV Lasix and metolazone with good urine output.  Further diuretic management per cardiology. He is currently on IV Rocephin and IV doxycycline for possible community-acquired pneumonia. He was started on IV steroids inhalers and continue on ongoing antibiotics for possible COPD exacerbation. In the afternoon he had to be placed on BiPAP as he appeared to be struggling to catch his breath.  After being placed on BiPAP he calmed down and his respiration slowed down. His procalcitonin is less than 0.1. Check a respiratory panel for RSVP. This morning it seems like his breathing is more comfortable he is moving more air on physical exam. We will give him a break from the BiPAP and place him on a nasal cannula 6 L if needed we could go up to a nonrebreather.  Acute kidney injury on chronic kidney disease stage III with baseline creatinine around 2. He continues to have good urine output on IV Lasix and metolazone appreciate cardiology's assistance.  He is having good urine output. He has had a mild increase in his creatinine.   Diabetes mellitus type 2 insulin-dependent: Blood glucose is running high we will double his long-acting this likely due to steroids.  Continue sliding scale insulin.  Hyperlipidemia: Continue statin.  Depression: Continue Lexapro.  Hypothyroidism: Continue Synthroid.  Essential hypertension: Continue IV Lasix Obesity hypoventilation syndrome on BiPAP at home.  Pressure ulcer stage II present on admission. RN Pressure Injury Documentation: Pressure Ulcer 07/27/14 Stage II -  Partial thickness loss of dermis presenting as a shallow open ulcer with a red, pink wound bed without slough. (Active)  07/27/14 1000  Location: Sacrum  Location Orientation:   Staging: Stage II -  Partial thickness loss of dermis presenting as a shallow open ulcer with a red, pink wound bed without slough.  Wound Description (Comments):   Present on Admission:      Pressure Injury 07/02/18 (Active)  07/02/18 1900  Location: Nose  Location Orientation: Anterior  Staging:   Wound Description (Comments):   Present on Admission:      Pressure Injury 11/21/18 Stage III -  Full thickness tissue loss. Subcutaneous fat may be visible but bone, tendon or muscle are NOT exposed. Full thicknes small open area to sacrum. (Active)  11/21/18 0005  Location: Sacrum  Location Orientation:   Staging: Stage III -  Full thickness tissue loss. Subcutaneous fat may be visible but bone, tendon or muscle are NOT exposed.  Wound Description (Comments): Full thicknes small open area to sacrum.  Present on Admission: Yes     Pressure Injury 05/01/19 Stage II -  Partial thickness loss of dermis presenting as a shallow open ulcer with a  red, pink wound bed without slough. (Active)  05/01/19 0615  Location: Sacrum  Location Orientation: Mid  Staging: Stage II -  Partial thickness loss of dermis presenting as a shallow open ulcer with a red, pink wound bed without slough.  Wound Description (Comments):   Present on  Admission: Yes     Pressure Injury 09/16/19 Sacrum Stage II -  Partial thickness loss of dermis presenting as a shallow open ulcer with a red, pink wound bed without slough. yellow (Active)  09/16/19 1608  Location: Sacrum  Location Orientation:   Staging: Stage II -  Partial thickness loss of dermis presenting as a shallow open ulcer with a red, pink wound bed without slough.  Wound Description (Comments): yellow  Present on Admission: Yes    Estimated body mass index is 39.99 kg/m as calculated from the following:   Height as of this encounter: 6\' 1"  (1.854 m).   Weight as of this encounter: 137.5 kg.   DVT prophylaxis: lovenxo Family Communication:none Disposition Plan/Barrier to D/C: unable to determine Code Status:     Code Status Orders  (From admission, onward)         Start     Ordered   10/04/19 0540  Do not attempt resuscitation (DNR)  Continuous    Question Answer Comment  In the event of cardiac or respiratory ARREST Do not call a "code blue"   In the event of cardiac or respiratory ARREST Do not perform Intubation, CPR, defibrillation or ACLS   In the event of cardiac or respiratory ARREST Use medication by any route, position, wound care, and other measures to relive pain and suffering. May use oxygen, suction and manual treatment of airway obstruction as needed for comfort.      10/04/19 0541        Code Status History    Date Active Date Inactive Code Status Order ID Comments User Context   10/04/2019 0449 10/04/2019 0541 DNR [id number]  Rise Patience, MD ED   09/16/2019 0059 09/17/2019 1808 Full Code [id number]  Elwyn Reach, MD ED   07/21/2019 0308 07/22/2019 0016 Full Code [id number]  Toy Baker, MD Inpatient   07/13/2019 2202 07/18/2019 1531 Full Code [id number]  Rise Patience, MD Inpatient   05/01/2019 0347 05/11/2019 1715 Full Code [id number]  Ivor Costa, MD ED   11/20/2018 1848 11/22/2018 1844 Full Code [id number]  Cristal Deer, MD  Inpatient   06/28/2018 2146 07/04/2018 1625 Full Code [id number]  Vianne Bulls, MD ED   04/16/2018 1515 05/12/2018 1931 Full Code [id number]  [name], [name] Inpatient   04/16/2018 1515 04/16/2018 1515 Full Code [id number]  [name], [name] Inpatient   04/08/2018 1733 04/16/2018 1504 Full Code [id number]  Eustace Moore, MD Inpatient   11/12/2017 1404 11/13/2017 0310 Full Code [id number]  Logan Bores, MD HOV   10/08/2017 1547 10/20/2017 1413 Full Code [id number]  Bary Leriche, [name] Inpatient   10/02/2017 1549 10/08/2017 1539 Full Code [id number]  Kinnie Feil, MD ED   03/26/2017 2315 04/01/2017 1622 Full Code [id number]  Norval Morton, MD Inpatient   09/07/2015 1230 09/07/2015 1956 Full Code [id number]  Martinique, Peter M, MD Inpatient   08/19/2015 2101 08/23/2015 1628 Full Code [id number]  Theressa Millard, MD Inpatient   07/18/2014 1825 08/09/2014 1422 Full Code [id number]  Flora Lipps Inpatient   07/10/2014 1643 07/18/2014 1825 Full Code [id number]  Jonetta Osgood, MD Inpatient   Advance Care Planning  Activity        IV Access:    Peripheral IV   Procedures and diagnostic studies:   No results found.   Medical Consultants:    None.  Anti-Infectives:   Doxy and Cefepime  Subjective:    Aniken P [place] he relates his breathing is about the same he continues to not able to lay flat to sleep.  Objective:    Vitals:   10/05/19 2332 10/06/19 0324 10/06/19 0325 10/06/19 0428  BP:    140/65  Pulse: 78  78 88  Resp: (!) 30  (!) 29 (!) 30  Temp:    98.5 F (36.9 C)  TempSrc:    Oral  SpO2: 95% 95% 95% 95%  Weight:    (!) 137.5 kg  Height:       SpO2: 95 % O2 Flow Rate (L/min): 6 L/min FiO2 (%): 40 %   Intake/Output Summary (Last 24 hours) at 10/06/2019 0737 Last data filed at 10/06/2019 0651 Gross per 24 hour  Intake 1634 ml  Output 3470 ml  Net -1836 ml   Filed Weights   10/04/19 2036 10/05/19 0406 10/06/19 0428  Weight: (!) 138.8 kg (!) 139.6  kg (!) 137.5 kg    Exam: General exam: In no acute distress, morbidly obese. Respiratory system: He is having good air movement today, it sounds clear to auscultation. Cardiovascular system: S1 & S2 heard, RRR. No JVD. Gastrointestinal system: Abdomen is nondistended, soft and nontender.  Central nervous system: Alert and oriented. No focal neurological deficits. Extremities: No pedal edema. Skin: No rashes, lesions or ulcers Psychiatry: Judgement and insight appear normal. Mood & affect appropriate.   Data Reviewed:    Labs: Basic Metabolic Panel: Recent Labs  Lab 10/03/19 2250 10/04/19 0825 10/04/19 1125 10/05/19 0926 10/06/19 0252  NA 135 137 136 137 135  K 4.3 4.0 3.8 4.3 4.3  CL 86* 88*  --  85* 88*  CO2 33* 34*  --  37* 35*  GLUCOSE 333* 196*  --  318* 378*  BUN 101* 99*  --  89* 92*  CREATININE 2.52* 2.45*  --  2.39* 2.56*  CALCIUM 9.1 8.8*  --  9.1 8.9  MG  --  2.7*  --  2.5* 2.4   GFR Estimated Creatinine Clearance: 36.8 mL/min (A) (by C-G formula based on SCr of 2.56 mg/dL (H)). Liver Function Tests: Recent Labs  Lab 10/03/19 2250 10/04/19 0825  AST 19 18  ALT 26 25  ALKPHOS 108 89  BILITOT 0.3 0.6  PROT 7.5 6.5  ALBUMIN 3.5 3.1*   No results for input(s): LIPASE, AMYLASE in the last 168 hours. No results for input(s): AMMONIA in the last 168 hours. Coagulation profile Recent Labs  Lab 10/03/19 2250  INR 1.0   COVID-19 Labs  Recent Labs    10/04/19 0108  DDIMER 0.59*    Lab Results  Component Value Date   SARSCOV2NAA NEGATIVE 10/03/2019   [place] NEGATIVE 09/15/2019   [place] NEGATIVE 07/20/2019   [place] NEGATIVE 07/13/2019    CBC: Recent Labs  Lab 10/03/19 2250 10/04/19 0825 10/04/19 1125 10/06/19 0252  WBC 9.3 8.5  --  7.4  NEUTROABS 8.0* 7.0  --   --   HGB 14.1 12.7* 12.9* 12.1*  HCT 42.8 40.1 38.0* 38.0*  MCV 91.1 93.0  --  91.3  PLT 218 199  --  179   Cardiac Enzymes: No results for input(s): CKTOTAL,  CKMB, CKMBINDEX, TROPONINI in the last 168 hours.  BNP (last 3 results) No results for input(s): PROBNP in the last 8760 hours. CBG: Recent Labs  Lab 10/04/19 2145 10/05/19 0725 10/05/19 1207 10/05/19 1608 10/05/19 2117  GLUCAP 210* 231* 249* 373* 427*   D-Dimer: Recent Labs    10/04/19 0108  DDIMER 0.59*   Hgb A1c: No results for input(s): HGBA1C in the last 72 hours. Lipid Profile: No results for input(s): CHOL, HDL, LDLCALC, TRIG, CHOLHDL, LDLDIRECT in the last 72 hours. Thyroid function studies: Recent Labs    10/04/19 0826  TSH 1.897   Anemia work up: No results for input(s): VITAMINB12, FOLATE, FERRITIN, TIBC, IRON, RETICCTPCT in the last 72 hours. Sepsis Labs: Recent Labs  Lab 10/03/19 2250 10/03/19 2325 10/04/19 0825 10/05/19 0926 10/06/19 0252  PROCALCITON  --   --   --  <0.10 <0.10  WBC 9.3  --  8.5  --  7.4  LATICACIDVEN  --  1.6  --   --   --    Microbiology Recent Results (from the past 240 hour(s))  SARS Coronavirus 2 [facility] order, Performed in [facility] hospital lab) Nasopharyngeal Nasopharyngeal Swab     Status: None   Collection Time: 10/03/19 11:27 PM   Specimen: Nasopharyngeal Swab  Result Value Ref Range Status   SARS Coronavirus 2 NEGATIVE NEGATIVE Final    Comment: (NOTE) If result is NEGATIVE SARS-CoV-2 target nucleic acids are NOT DETECTED. The SARS-CoV-2 RNA is generally detectable in upper and lower  respiratory specimens during the acute phase of infection. The lowest  concentration of SARS-CoV-2 viral copies this assay can detect is 250  copies / mL. A negative result does not preclude SARS-CoV-2 infection  and should not be used as the sole basis for treatment or other  patient management decisions.  A negative result may occur with  improper specimen collection / handling, submission of specimen other  than nasopharyngeal swab, presence of viral mutation(s) within the  areas targeted by this assay, and inadequate number of  viral copies  (<250 copies / mL). A negative result must be combined with clinical  observations, patient history, and epidemiological information. If result is POSITIVE SARS-CoV-2 target nucleic acids are DETECTED. The SARS-CoV-2 RNA is generally detectable in upper and lower  respiratory specimens dur ing the acute phase of infection.  Positive  results are indicative of active infection with SARS-CoV-2.  Clinical  correlation with patient history and other diagnostic information is  necessary to determine patient infection status.  Positive results do  not rule out bacterial infection or co-infection with other viruses. If result is PRESUMPTIVE POSTIVE SARS-CoV-2 nucleic acids MAY BE PRESENT.   A presumptive positive result was obtained on the submitted specimen  and confirmed on repeat testing.  While 2019 novel coronavirus  (SARS-CoV-2) nucleic acids may be present in the submitted sample  additional confirmatory testing may be necessary for epidemiological  and / or clinical management purposes  to differentiate between  SARS-CoV-2 and other Sarbecovirus currently known to infect humans.  If clinically indicated additional testing with an alternate test  methodology [id number]) is advised. The SARS-CoV-2 RNA is generally  detectable in upper and lower respiratory sp ecimens during the acute  phase of infection. The expected result is Negative. Fact Sheet for Patients:  StrictlyIdeas.no Fact Sheet for Healthcare Providers: BankingDealers.co.za This test is not yet approved or cleared by the [place] FDA and has been authorized for detection and/or diagnosis of SARS-CoV-2 by FDA under an Emergency Use Authorization (EUA).  This  EUA will remain in effect (meaning this test can be used) for the duration of the COVID-19 declaration under Section 564(b)(1) of the Act, 21 U.S.C. section 360bbb-3(b)(1), unless the authorization is  terminated or revoked sooner. Performed at [facility], [place] [address] [place], [place] [address]   Blood Culture (routine x 2)     Status: None (Preliminary result)   Collection Time: 10/03/19 11:30 PM   Specimen: BLOOD  Result Value Ref Range Status   Specimen Description BLOOD  Final   Special Requests   Final    BOTTLES DRAWN AEROBIC AND ANAEROBIC Blood Culture adequate volume   Culture   Final    NO GROWTH 1 DAY Performed at [facility], [place] [address], [place], [place] [address]    Report Status PENDING  Incomplete  Blood Culture (routine x 2)     Status: None (Preliminary result)   Collection Time: 10/03/19 11:35 PM   Specimen: BLOOD LEFT ARM  Result Value Ref Range Status   Specimen Description BLOOD LEFT ARM  Final   Special Requests   Final    BOTTLES DRAWN AEROBIC AND ANAEROBIC Blood Culture adequate volume   Culture   Final    NO GROWTH 1 DAY Performed at [facility], [place] [address] [place],  [address]    Report Status PENDING  Incomplete  Urine culture     Status: Abnormal   Collection Time: 10/04/19 12:18 AM   Specimen: In/Out Cath Urine  Result Value Ref Range Status   Specimen Description IN/OUT CATH URINE  Final   Special Requests   Final    NONE Performed at [facility], [place] [address] [place], [place] [address]    Culture >=100,000 COLONIES/mL PSEUDOMONAS AERUGINOSA (A)  Final   Report Status 10/05/2019 FINAL  Final   Organism ID, Bacteria PSEUDOMONAS AERUGINOSA (A)  Final      Susceptibility   Pseudomonas aeruginosa - MIC*    CEFTAZIDIME 4 SENSITIVE Sensitive     CIPROFLOXACIN >=4 RESISTANT Resistant     GENTAMICIN RESISTANT Resistant     IMIPENEM 1 SENSITIVE Sensitive     PIP/TAZO 16 SENSITIVE Sensitive     CEFEPIME 8 SENSITIVE Sensitive     * >=100,000 COLONIES/mL PSEUDOMONAS AERUGINOSA     Medications:   . aspirin EC  81 mg Oral Daily  . atorvastatin  20 mg Oral QPM  . Chlorhexidine Gluconate Cloth   6 each Topical Daily  . enoxaparin (LOVENOX) injection  70 mg Subcutaneous Q24H  . escitalopram  20 mg Oral Daily  . fesoterodine  8 mg Oral QHS  . gabapentin  400 mg Oral TID  . hydrALAZINE  37.5 mg Oral TID  . insulin aspart  0-5 Units Subcutaneous QHS  . insulin aspart  0-9 Units Subcutaneous TID WC  . insulin aspart  3 Units Subcutaneous TID WC  . insulin glargine  20 Units Subcutaneous BID  . ipratropium-albuterol  3 mL Nebulization Q4H  . levothyroxine  175 mcg Oral Q0600  . methylPREDNISolone (SOLU-MEDROL) injection  40 mg Intravenous Q12H  . metolazone  2.5 mg Oral Daily  . multivitamin with minerals  1 tablet Oral Daily  . polyethylene glycol  17 g Oral QHS  . potassium chloride SA  20 mEq Oral Daily  . pramipexole  0.5 mg Oral Daily  . pramipexole  1 mg Oral QHS  . spironolactone  25 mg Oral Daily  . tamsulosin  0.4  mg Oral QHS   Continuous Infusions: . doxycycline (VIBRAMYCIN) IV 100 mg (10/05/19 2200)  . furosemide 120 mg (10/06/19 0445)      LOS: 2 days   Charlynne Cousins  [facility]  10/06/2019, 7:37 AM

## 2019-10-06 NOTE — Progress Notes (Signed)
RT placed attempted to take patient off of bipap this AM and place on 6L nasal cannula however patient's work of breathing increased and patient looked as though was having trouble breathing.  Patient asking if there was another way to help with his breathing besides bipap.  Placed patient on heated high-flow nasal cannula at 25L and FIO2 of 50%.  Patient stated it felt better than the bipap and is tolerating well.  Will continue to monitor.

## 2019-10-06 NOTE — Progress Notes (Addendum)
Advanced Heart Failure Rounding Note  PCP-Cardiologist: Mertie Moores, MD   Subjective:    Admitted with marked volume overload and respiratory distress.   Continues to diuresed with IV lasix + metolazone. Has been on and off Bipap  Asking for something to drink. Says he feels a little better today.    Objective:   Weight Range: (!) 137.5 kg Body mass index is 39.99 kg/m.   Vital Signs:   Temp:  [98.5 F (36.9 C)] 98.5 F (36.9 C) (10/08 0428) Pulse Rate:  [78-88] 85 (10/08 1107) Resp:  [16-36] 28 (10/08 1107) BP: (118-163)/(65-81) 118/81 (10/08 1044) SpO2:  [88 %-100 %] 90 % (10/08 1107) FiO2 (%):  [40 %-50 %] 50 % (10/08 1107) Weight:  [137.5 kg] 137.5 kg (10/08 0428) Last BM Date: 10/04/19(small bowel movement this morning per patient )  Weight change: Filed Weights   10/04/19 2036 10/05/19 0406 10/06/19 0428  Weight: (!) 138.8 kg (!) 139.6 kg (!) 137.5 kg    Intake/Output:   Intake/Output Summary (Last 24 hours) at 10/06/2019 1201 Last data filed at 10/06/2019 1100 Gross per 24 hour  Intake 1510 ml  Output 3470 ml  Net -1960 ml      Physical Exam    General:  Dyspnea with movement.   HEENT: normal Neck: supple. JVP hard to assess . Carotids 2+ bilat; no bruits. No lymphadenopathy or thryomegaly appreciated. Cor: PMI nondisplaced. Regular rate & rhythm. No rubs, gallops or murmurs. Lungs: decreased throuhgout on HFNC  Abdomen: soft, nontender, distended. No hepatosplenomegaly. No bruits or masses. Good bowel sounds. Extremities: no cyanosis, clubbing, rash, R and LLE 1+ edema Neuro: alert & orientedx3, cranial nerves grossly intact. moves all 4 extremities w/o difficulty. Affect pleasant   Telemetry  SR 80s personally reviewed   EKG    [name]   Labs    CBC Recent Labs    10/03/19 2250 10/04/19 0825 10/04/19 1125 10/06/19 0252  WBC 9.3 8.5  --  7.4  NEUTROABS 8.0* 7.0  --   --   HGB 14.1 12.7* 12.9* 12.1*  HCT 42.8 40.1 38.0* 38.0*   MCV 91.1 93.0  --  91.3  PLT 218 199  --  0000000   Basic Metabolic Panel Recent Labs    10/05/19 0926 10/06/19 0252  NA 137 135  K 4.3 4.3  CL 85* 88*  CO2 37* 35*  GLUCOSE 318* 378*  BUN 89* 92*  CREATININE 2.39* 2.56*  CALCIUM 9.1 8.9  MG 2.5* 2.4   Liver Function Tests Recent Labs    10/03/19 2250 10/04/19 0825  AST 19 18  ALT 26 25  ALKPHOS 108 89  BILITOT 0.3 0.6  PROT 7.5 6.5  ALBUMIN 3.5 3.1*   No results for input(s): LIPASE, AMYLASE in the last 72 hours. Cardiac Enzymes No results for input(s): CKTOTAL, CKMB, CKMBINDEX, TROPONINI in the last 72 hours.  BNP: BNP (last 3 results) Recent Labs    08/03/19 1040 09/15/19 2138 10/03/19 2250  BNP 70.1 28.6 51.6    ProBNP (last 3 results) No results for input(s): PROBNP in the last 8760 hours.   D-Dimer Recent Labs    10/04/19 0108  DDIMER 0.59*   Hemoglobin A1C No results for input(s): HGBA1C in the last 72 hours. Fasting Lipid Panel No results for input(s): CHOL, HDL, LDLCALC, TRIG, CHOLHDL, LDLDIRECT in the last 72 hours. Thyroid Function Tests Recent Labs    10/04/19 0826  TSH 1.897    Other results:  Imaging    No results found.   Medications:     Scheduled Medications: . aspirin EC  81 mg Oral Daily  . atorvastatin  20 mg Oral QPM  . Chlorhexidine Gluconate Cloth  6 each Topical Daily  . enoxaparin (LOVENOX) injection  70 mg Subcutaneous Q24H  . escitalopram  20 mg Oral Daily  . fesoterodine  8 mg Oral QHS  . gabapentin  400 mg Oral TID  . hydrALAZINE  37.5 mg Oral TID  . insulin aspart  0-5 Units Subcutaneous QHS  . insulin aspart  0-9 Units Subcutaneous TID WC  . insulin aspart  3 Units Subcutaneous TID WC  . insulin glargine  35 Units Subcutaneous BID  . ipratropium-albuterol  3 mL Nebulization Q4H  . levothyroxine  175 mcg Oral Q0600  . methylPREDNISolone (SOLU-MEDROL) injection  40 mg Intravenous Q12H  . metolazone  2.5 mg Oral Daily  . multivitamin with minerals   1 tablet Oral Daily  . polyethylene glycol  17 g Oral QHS  . potassium chloride SA  20 mEq Oral Daily  . pramipexole  0.5 mg Oral Daily  . pramipexole  1 mg Oral QHS  . spironolactone  25 mg Oral Daily  . tamsulosin  0.4 mg Oral QHS    Infusions: . sodium chloride 20 mL (10/06/19 1039)  . ceFEPime (MAXIPIME) IV 1 g (10/06/19 1041)  . doxycycline (VIBRAMYCIN) IV 100 mg (10/05/19 2200)  . furosemide 120 mg (10/06/19 0445)    PRN Medications: sodium chloride, acetaminophen **OR** acetaminophen, albuterol, ipratropium-albuterol, ondansetron **OR** ondansetron (ZOFRAN) IV    Assessment/Plan   1. A/C Hypoxic Respiratory Failure - multifactorial with  COPD/ diastolic HF Possible CAP.  Remains SOB at rest. Oxygen has been weaned to 6 liters.  He is DNI.  - Hopefully will improve with high dose IV diuresis.  -Blood Cx - NGTD Lactic Acid 1.6  On empiric antibiotics for possible CAP.   2. A/C Diastolic Heart Failure Most recent ECHO LVEF 60-65% with normal RV.  Significant lower extremity edmea.  - Continue IV lasix 120 mg twice a day + metolazone.  Reinforced he needs to limit fluid intake.   3. CKD Stage IV -Creatinine baseline 2-2.2 -On admit creatinine 2.5. - Creatinine trending up 2.3>2.5  - Watch renal function closely.   4. LLE Erythema, Possible cellulitis.  On antibiotics.  WBC is not elevated.   5 Hypothyroidism  TSH 1.9  Continue levothyroxine.   6. OSA Uses Bipap nightly at home.  7. CAD . Non-obstructive. LHC 08/2015: LM 40, LAD distal 40, LCx ostial 50, RCA proximal 40 - No chest - On ASA/statin  -Offb-blocker   8. DMII Will need SSI   9. UTI Urine CX- GNR with final speciation pending. On IV doxycycline.  Per primary team.   Length of Stay: 2  Darrick Grinder, NP  10/06/2019, 12:01 PM  Advanced Heart Failure Team Pager [phone] (M-F; [place])  Please contact [facility] for night-coverage after hours (4p -7a ) and weekends on  [email or web address]  Patient seen and examined with the above-signed Advanced Practice Provider and/or Housestaff. I personally reviewed laboratory data, imaging studies and relevant notes. I independently examined the patient and formulated the important aspects of the plan. I have edited the note to reflect any of my changes or salient points. I have personally discussed the plan with the patient and/or family.  He is breathing better today with diuresis and treatment of COPD flare but overall remains  very weak and tenuous. On exam remains volume overloaded with 2+ edema. Lung sounds very coarse but no wheezing. Will continue high-dose IV lasix but renal function is trending up slightly so will need to watch closely. He has insatiable thirst and it is making it hard to maintain his fluid status. I worry he is nearing end-stage. Also being treated for UTI.  He was DNR/DNI but now he says he wants to be Full Code. We will need to revisit soon.   Glori Bickers, MD  9:50 PM

## 2019-10-06 NOTE — Progress Notes (Signed)
Inpatient Diabetes Program Recommendations  AACE/ADA: New Consensus Statement on Inpatient Glycemic Control (2015)  Target Ranges:  Prepandial:   less than 140 mg/dL      Peak postprandial:   less than 180 mg/dL (1-2 hours)      Critically ill patients:  140 - 180 mg/dL   Lab Results  Component Value Date   GLUCAP 365 (H) 10/06/2019   HGBA1C 9.4 (H) 09/16/2019    Review of Glycemic Control Results for GRADIE, TRUMPY (MRN [id number]) as of 10/06/2019 11:28  Ref. Range 10/05/2019 12:07 10/05/2019 16:08 10/05/2019 21:17 10/06/2019 07:50  Glucose-Capillary Latest Ref Range: 70 - 99 mg/dL 249 (H) 373 (H) 427 (H) 365 (H)   Diabetes history: Type 2 DM Outpatient Diabetes medications: Novolog 22 units TID, Lantus 72 units QHS Current orders for Inpatient glycemic control: Lantus 35 units BID, Novolog 3 units TID, Novolog 0-9 units TID, Novolog 0-5 units QHS  Inpatient Diabetes Program Recommendations:    Consider increasing Novolog 8 units TID (assuming patient is consuming >50% of meals).   Thanks, Bronson Curb, MSN, RNC-OB Diabetes Coordinator [phone] (8a-5p)

## 2019-10-07 DIAGNOSIS — J441 Chronic obstructive pulmonary disease with (acute) exacerbation: Secondary | ICD-10-CM

## 2019-10-07 DIAGNOSIS — Z794 Long term (current) use of insulin: Secondary | ICD-10-CM

## 2019-10-07 DIAGNOSIS — L89152 Pressure ulcer of sacral region, stage 2: Secondary | ICD-10-CM

## 2019-10-07 DIAGNOSIS — E1122 Type 2 diabetes mellitus with diabetic chronic kidney disease: Secondary | ICD-10-CM

## 2019-10-07 DIAGNOSIS — E662 Morbid (severe) obesity with alveolar hypoventilation: Secondary | ICD-10-CM

## 2019-10-07 DIAGNOSIS — N183 Chronic kidney disease, stage 3 unspecified: Secondary | ICD-10-CM

## 2019-10-07 DIAGNOSIS — I5031 Acute diastolic (congestive) heart failure: Secondary | ICD-10-CM

## 2019-10-07 LAB — CBC
HCT: 36.6 % — ABNORMAL LOW (ref 39.0–52.0)
Hemoglobin: 12 g/dL — ABNORMAL LOW (ref 13.0–17.0)
MCH: 29.3 pg (ref 26.0–34.0)
MCHC: 32.8 g/dL (ref 30.0–36.0)
MCV: 89.5 fL (ref 80.0–100.0)
Platelets: 168 10*3/uL (ref 150–400)
RBC: 4.09 MIL/uL — ABNORMAL LOW (ref 4.22–5.81)
RDW: 16 % — ABNORMAL HIGH (ref 11.5–15.5)
WBC: 8.6 10*3/uL (ref 4.0–10.5)
nRBC: 0 % (ref 0.0–0.2)

## 2019-10-07 LAB — GLUCOSE, CAPILLARY
Glucose-Capillary: 379 mg/dL — ABNORMAL HIGH (ref 70–99)
Glucose-Capillary: 413 mg/dL — ABNORMAL HIGH (ref 70–99)
Glucose-Capillary: 508 mg/dL (ref 70–99)
Glucose-Capillary: 436 mg/dL — ABNORMAL HIGH (ref 70–99)

## 2019-10-07 LAB — BASIC METABOLIC PANEL
Anion gap: 14 (ref 5–15)
BUN: 101 mg/dL — ABNORMAL HIGH (ref 8–23)
CO2: 37 mmol/L — ABNORMAL HIGH (ref 22–32)
Calcium: 9.2 mg/dL (ref 8.9–10.3)
Chloride: 83 mmol/L — ABNORMAL LOW (ref 98–111)
Creatinine, Ser: 2.5 mg/dL — ABNORMAL HIGH (ref 0.61–1.24)
GFR calc Af Amer: 28 mL/min — ABNORMAL LOW (ref >60)
GFR calc non Af Amer: 24 mL/min — ABNORMAL LOW (ref >60)
Glucose, Bld: 362 mg/dL — ABNORMAL HIGH (ref 70–99)
Potassium: 3.8 mmol/L (ref 3.5–5.1)
Sodium: 134 mmol/L — ABNORMAL LOW (ref 135–145)

## 2019-10-07 LAB — MAGNESIUM: Magnesium: 2.4 mg/dL (ref 1.7–2.4)

## 2019-10-07 LAB — PROCALCITONIN: Procalcitonin: <0.1 ng/mL

## 2019-10-07 MED ORDER — INSULIN ASPART 100 UNIT/ML ~~LOC~~ SOLN: 0.0000 [IU] | Freq: Three times a day (TID) | SUBCUTANEOUS | Status: DC

## 2019-10-07 MED ORDER — INSULIN ASPART 100 UNIT/ML ~~LOC~~ SOLN: 0.0000 [IU] | Freq: Every day | SUBCUTANEOUS | Status: DC

## 2019-10-07 MED ORDER — INSULIN ASPART 100 UNIT/ML ~~LOC~~ SOLN
0.0000 [IU] | Freq: Three times a day (TID) | SUBCUTANEOUS | Status: DC
  Administered 2019-10-07 – 2019-10-08 (×3): 20 [IU] via SUBCUTANEOUS
  Administered 2019-10-08: 7 [IU] via SUBCUTANEOUS
  Administered 2019-10-09: 11 [IU] via SUBCUTANEOUS
  Administered 2019-10-09: 20 [IU] via SUBCUTANEOUS
  Administered 2019-10-09: 11 [IU] via SUBCUTANEOUS
  Administered 2019-10-10: 4 [IU] via SUBCUTANEOUS
  Administered 2019-10-10: 3 [IU] via SUBCUTANEOUS
  Administered 2019-10-10: 15 [IU] via SUBCUTANEOUS
  Administered 2019-10-11: 4 [IU] via SUBCUTANEOUS
  Administered 2019-10-11: 7 [IU] via SUBCUTANEOUS
  Administered 2019-10-11: 11 [IU] via SUBCUTANEOUS
  Administered 2019-10-12: 4 [IU] via SUBCUTANEOUS
  Administered 2019-10-12: 3 [IU] via SUBCUTANEOUS

## 2019-10-07 MED ORDER — INSULIN GLARGINE 100 UNIT/ML ~~LOC~~ SOLN
55.0000 [IU] | Freq: Two times a day (BID) | SUBCUTANEOUS | Status: DC
  Administered 2019-10-07: 55 [IU] via SUBCUTANEOUS
  Filled 2019-10-07 (×3): qty 0.55

## 2019-10-07 MED ORDER — INSULIN ASPART 100 UNIT/ML ~~LOC~~ SOLN
10.0000 [IU] | Freq: Three times a day (TID) | SUBCUTANEOUS | Status: DC
  Administered 2019-10-07: 10 [IU] via SUBCUTANEOUS

## 2019-10-07 MED ORDER — ACETAZOLAMIDE 250 MG PO TABS
250.0000 mg | ORAL_TABLET | Freq: Two times a day (BID) | ORAL | Status: DC
  Administered 2019-10-07 – 2019-10-11 (×9): 250 mg via ORAL
  Filled 2019-10-07 (×10): qty 1

## 2019-10-07 MED ORDER — INSULIN ASPART 100 UNIT/ML ~~LOC~~ SOLN
0.0000 [IU] | Freq: Every day | SUBCUTANEOUS | Status: DC
  Administered 2019-10-07: 5 [IU] via SUBCUTANEOUS
  Administered 2019-10-08: 3 [IU] via SUBCUTANEOUS
  Administered 2019-10-10: 2 [IU] via SUBCUTANEOUS

## 2019-10-07 MED ORDER — POTASSIUM CHLORIDE CRYS ER 20 MEQ PO TBCR
20.0000 meq | EXTENDED_RELEASE_TABLET | Freq: Once | ORAL | Status: AC
  Administered 2019-10-07: 20 meq via ORAL
  Filled 2019-10-07: qty 1

## 2019-10-07 NOTE — Progress Notes (Signed)
PROGRESS NOTE    Travis [name]  [id number] DOB: [birth year] DOA: 10/03/2019 PCP: [name], MD    Brief Narrative:  73 y.o. male past medical history of chronic diastolic heart failure, diabetes mellitus type 2, COPD essential hypertension obesity hypoventilation syndrome BiPAP, chronic kidney disease with a baseline creatinine around 2 with a recent admission for heart failure,comes in for shortness of breath in the ED he was found to be hypoxic requiring a nonrebreather chest x-ray showed bilateral atelectasis COVID-19 PCR was negative, on exam he has JVD with lower extremity edema.  Assessment & Plan:   Principal Problem:   Acute respiratory failure with hypoxia (HCC) Active Problems:   Tetraparesis (HCC)   Obesity, Class III, BMI 40-49.9 (morbid obesity) (HCC)   Type 2 diabetes mellitus with stage 3 chronic kidney disease, with long-term current use of insulin (HCC)   Acute CHF (congestive heart failure) (HCC)   Pressure injury of sacral region, stage 2 (HCC)   Obesity hypoventilation syndrome (HCC)   Acute respiratory failure with hypoxia due to chronic diastolic heart failure and possibly due to acute COPD exacerbation.: -Continue IV Lasix and metolazone with continued good urine output.   -Cardiology following. Recommendation to diurese, with addition of acetazolamide -Continued on IV Rocephin and IV doxycycline for possible community-acquired pneumonia. -Pt is continued on IV steroids inhalers and continue on ongoing antibiotics for possible COPD exacerbation. -On 10/8, pt noted to have increased work of breathing and increased O2 requirements, requiring bipap. Pt transferred to SDU. Good diuresis overnight and O2 requirements downgraded to high flow O2 -Cont to wean O2 as tolerated. .  Acute kidney injury on chronic kidney disease stage III with baseline creatinine around 2. -Cont with good urine output, tolerating diuretics -Renal function overall stable  -Repeat bmet in AM  Diabetes mellitus type 2 insulin-dependent: -Glucose poorly controlled with concurrent IV steroids -Glucose up to the 500's this afternoon -Have changed SSI to resistant scale -Increased levemir to 55 units bid with 10 units meal coverage  Hyperlipidemia: -continued on statin, seems stable  Depression: -Continue Lexapro as tolerated  Hypothyroidism: -Continue Synthroid 128mcg daily  Essential hypertension: -Continue IV Lasix -Obesity hypoventilation syndrome on BiPAP at home.  Pressure ulcer stage II present on admission. -Seems stable at this time  DVT prophylaxis: Lovenox subq Code Status: Full Family Communication: Pt in room, family not at bedside Disposition Plan: Uncertain at this time  Consultants:   Cardiology  Procedures:     Antimicrobials: Anti-infectives (From admission, onward)   Start     Dose/Rate Route Frequency Ordered Stop   10/06/19 0900  ceFEPIme (MAXIPIME) 1 g in sodium chloride 0.9 % 100 mL IVPB     1 g 200 mL/hr over 30 Minutes Intravenous Daily 10/06/19 0755     10/05/19 1645  cefTRIAXone (ROCEPHIN) 1 g in sodium chloride 0.9 % 100 mL IVPB  Status:  Discontinued     1 g 200 mL/hr over 30 Minutes Intravenous Every 24 hours 10/05/19 1631 10/05/19 1633   10/05/19 1000  doxycycline (VIBRAMYCIN) 100 mg in sodium chloride 0.9 % 250 mL IVPB     100 mg 125 mL/hr over 120 Minutes Intravenous Every 12 hours 10/05/19 0927 10/11/19 0959   10/05/19 0930  doxycycline (VIBRAMYCIN) 100 mg in sodium chloride 0.9 % 250 mL IVPB  Status:  Discontinued     100 mg 125 mL/hr over 120 Minutes Intravenous Every 12 hours 10/05/19 0920 10/05/19 0927   10/04/19 1200  ceFEPIme (MAXIPIME) 2  g in sodium chloride 0.9 % 100 mL IVPB  Status:  Discontinued     2 g 200 mL/hr over 30 Minutes Intravenous Every 12 hours 10/04/19 1127 10/05/19 0920   10/03/19 2300  cefTRIAXone (ROCEPHIN) 2 g in sodium chloride 0.9 % 100 mL IVPB  Status:  Discontinued      2 g 200 mL/hr over 30 Minutes Intravenous Every 24 hours 10/03/19 2255 10/04/19 1057   10/03/19 2300  azithromycin (ZITHROMAX) 500 mg in sodium chloride 0.9 % 250 mL IVPB  Status:  Discontinued     500 mg 250 mL/hr over 60 Minutes Intravenous Every 24 hours 10/03/19 2255 10/05/19 0919       Subjective: States breathing better today  Objective: Vitals:   10/07/19 1352 10/07/19 1501 10/07/19 1621 10/07/19 1715  BP:  (!) 138/39 113/81   Pulse:  89    Resp: (!) 25     Temp:  97.7 F (36.5 C)    TempSrc:  Oral    SpO2: (!) 88% 92%  94%  Weight:      Height:        Intake/Output Summary (Last 24 hours) at 10/07/2019 1720 Last data filed at 10/07/2019 1700 Gross per 24 hour  Intake 1727.69 ml  Output 3800 ml  Net -2072.31 ml   Filed Weights   10/05/19 0406 10/06/19 0428 10/07/19 0455  Weight: (!) 139.6 kg (!) 137.5 kg 135.2 kg    Examination:  General exam: Appears calm and comfortable  Respiratory system: Clear to auscultation. Respiratory effort normal. Decreased BS Cardiovascular system: S1 & S2 heard, regular Gastrointestinal system: Abdomen is nondistended, soft and nontender. No organomegaly or masses felt. Normal bowel sounds heard. Central nervous system: Alert and oriented. No focal neurological deficits. Extremities: Symmetric 5 x 5 power. Skin: No rashes, lesions Psychiatry: Judgement and insight appear normal. Mood & affect appropriate.   Data Reviewed: I have personally reviewed following labs and imaging studies  CBC: Recent Labs  Lab 10/03/19 2250 10/04/19 0825 10/04/19 1125 10/06/19 0252 10/07/19 0346  WBC 9.3 8.5  --  7.4 8.6  NEUTROABS 8.0* 7.0  --   --   --   HGB 14.1 12.7* 12.9* 12.1* 12.0*  HCT 42.8 40.1 38.0* 38.0* 36.6*  MCV 91.1 93.0  --  91.3 89.5  PLT 218 199  --  179 [id number]   Basic Metabolic Panel: Recent Labs  Lab 10/03/19 2250 10/04/19 0825 10/04/19 1125 10/05/19 0926 10/06/19 0252 10/07/19 0346  NA 135 137 136 137 135  134*  K 4.3 4.0 3.8 4.3 4.3 3.8  CL 86* 88*  --  85* 88* 83*  CO2 33* 34*  --  37* 35* 37*  GLUCOSE 333* 196*  --  318* 378* 362*  BUN 101* 99*  --  89* 92* 101*  CREATININE 2.52* 2.45*  --  2.39* 2.56* 2.50*  CALCIUM 9.1 8.8*  --  9.1 8.9 9.2  MG  --  2.7*  --  2.5* 2.4 2.4   GFR: Estimated Creatinine Clearance: 37.4 mL/min (A) (by C-G formula based on SCr of 2.5 mg/dL (H)). Liver Function Tests: Recent Labs  Lab 10/03/19 2250 10/04/19 0825  AST 19 18  ALT 26 25  ALKPHOS 108 89  BILITOT 0.3 0.6  PROT 7.5 6.5  ALBUMIN 3.5 3.1*   No results for input(s): LIPASE, AMYLASE in the last 168 hours. No results for input(s): AMMONIA in the last 168 hours. Coagulation Profile: Recent Labs  Lab 10/03/19 2250  INR 1.0   Cardiac Enzymes: No results for input(s): CKTOTAL, CKMB, CKMBINDEX, TROPONINI in the last 168 hours. BNP (last 3 results) No results for input(s): PROBNP in the last 8760 hours. HbA1C: No results for input(s): HGBA1C in the last 72 hours. CBG: Recent Labs  Lab 10/06/19 1636 10/06/19 2136 10/07/19 0806 10/07/19 1236 10/07/19 1538  GLUCAP 360* 439* 379* 413* 508*   Lipid Profile: No results for input(s): CHOL, HDL, LDLCALC, TRIG, CHOLHDL, LDLDIRECT in the last 72 hours. Thyroid Function Tests: No results for input(s): TSH, T4TOTAL, FREET4, T3FREE, THYROIDAB in the last 72 hours. Anemia Panel: No results for input(s): VITAMINB12, FOLATE, FERRITIN, TIBC, IRON, RETICCTPCT in the last 72 hours. Sepsis Labs: Recent Labs  Lab 10/03/19 2325 10/05/19 0926 10/06/19 0252 10/07/19 0346  PROCALCITON  --  <0.10 <0.10 <0.10  LATICACIDVEN 1.6  --   --   --     Recent Results (from the past 240 hour(s))  SARS Coronavirus 2 [facility] order, Performed in [facility] hospital lab) Nasopharyngeal Nasopharyngeal Swab     Status: None   Collection Time: 10/03/19 11:27 PM   Specimen: Nasopharyngeal Swab  Result Value Ref Range Status   SARS Coronavirus 2 NEGATIVE  NEGATIVE Final    Comment: (NOTE) If result is NEGATIVE SARS-CoV-2 target nucleic acids are NOT DETECTED. The SARS-CoV-2 RNA is generally detectable in upper and lower  respiratory specimens during the acute phase of infection. The lowest  concentration of SARS-CoV-2 viral copies this assay can detect is 250  copies / mL. A negative result does not preclude SARS-CoV-2 infection  and should not be used as the sole basis for treatment or other  patient management decisions.  A negative result may occur with  improper specimen collection / handling, submission of specimen other  than nasopharyngeal swab, presence of viral mutation(s) within the  areas targeted by this assay, and inadequate number of viral copies  (<250 copies / mL). A negative result must be combined with clinical  observations, patient history, and epidemiological information. If result is POSITIVE SARS-CoV-2 target nucleic acids are DETECTED. The SARS-CoV-2 RNA is generally detectable in upper and lower  respiratory specimens dur ing the acute phase of infection.  Positive  results are indicative of active infection with SARS-CoV-2.  Clinical  correlation with patient history and other diagnostic information is  necessary to determine patient infection status.  Positive results do  not rule out bacterial infection or co-infection with other viruses. If result is PRESUMPTIVE POSTIVE SARS-CoV-2 nucleic acids MAY BE PRESENT.   A presumptive positive result was obtained on the submitted specimen  and confirmed on repeat testing.  While 2019 novel coronavirus  (SARS-CoV-2) nucleic acids may be present in the submitted sample  additional confirmatory testing may be necessary for epidemiological  and / or clinical management purposes  to differentiate between  SARS-CoV-2 and other Sarbecovirus currently known to infect humans.  If clinically indicated additional testing with an alternate test  methodology [phone]) is  advised. The SARS-CoV-2 RNA is generally  detectable in upper and lower respiratory sp ecimens during the acute  phase of infection. The expected result is Negative. Fact Sheet for Patients:  StrictlyIdeas.no Fact Sheet for Healthcare Providers: BankingDealers.co.za This test is not yet approved or cleared by the [place] FDA and has been authorized for detection and/or diagnosis of SARS-CoV-2 by FDA under an Emergency Use Authorization (EUA).  This EUA will remain in effect (meaning this test can be used) for the duration  of the COVID-19 declaration under Section 564(b)(1) of the Act, 21 U.S.C. section 360bbb-3(b)(1), unless the authorization is terminated or revoked sooner. Performed at [facility], [place] [address], [place], [place] [address]   Blood Culture (routine x 2)     Status: None (Preliminary result)   Collection Time: 10/03/19 11:30 PM   Specimen: BLOOD  Result Value Ref Range Status   Specimen Description BLOOD  Final   Special Requests   Final    BOTTLES DRAWN AEROBIC AND ANAEROBIC Blood Culture adequate volume   Culture   Final    NO GROWTH 3 DAYS Performed at [facility], [place] [address] [place], [place] [address]    Report Status PENDING  Incomplete  Blood Culture (routine x 2)     Status: None (Preliminary result)   Collection Time: 10/03/19 11:35 PM   Specimen: BLOOD LEFT ARM  Result Value Ref Range Status   Specimen Description BLOOD LEFT ARM  Final   Special Requests   Final    BOTTLES DRAWN AEROBIC AND ANAEROBIC Blood Culture adequate volume   Culture   Final    NO GROWTH 3 DAYS Performed at [facility], [place] [address] [place], [place] [address]    Report Status PENDING  Incomplete  Urine culture     Status: Abnormal   Collection Time: 10/04/19 12:18 AM   Specimen: In/Out Cath Urine  Result Value Ref Range Status   Specimen Description IN/OUT CATH URINE  Final   Special  Requests   Final    NONE Performed at [facility], [place] [address] [place], [place] [address]    Culture >=100,000 COLONIES/mL PSEUDOMONAS AERUGINOSA (A)  Final   Report Status 10/05/2019 FINAL  Final   Organism ID, Bacteria PSEUDOMONAS AERUGINOSA (A)  Final      Susceptibility   Pseudomonas aeruginosa - MIC*    CEFTAZIDIME 4 SENSITIVE Sensitive     CIPROFLOXACIN >=4 RESISTANT Resistant     GENTAMICIN RESISTANT Resistant     IMIPENEM 1 SENSITIVE Sensitive     PIP/TAZO 16 SENSITIVE Sensitive     CEFEPIME 8 SENSITIVE Sensitive     * >=100,000 COLONIES/mL PSEUDOMONAS AERUGINOSA  Respiratory Panel by PCR     Status: None   Collection Time: 10/06/19 12:35 PM   Specimen: Nasopharyngeal Swab; Respiratory  Result Value Ref Range Status   Adenovirus NOT DETECTED NOT DETECTED Final   Coronavirus 229E NOT DETECTED NOT DETECTED Final    Comment: (NOTE) The Coronavirus on the Respiratory Panel, DOES NOT test for the novel  Coronavirus (2019 nCoV)    Coronavirus HKU1 NOT DETECTED NOT DETECTED Final   Coronavirus NL63 NOT DETECTED NOT DETECTED Final   Coronavirus OC43 NOT DETECTED NOT DETECTED Final   Metapneumovirus NOT DETECTED NOT DETECTED Final   Rhinovirus / Enterovirus NOT DETECTED NOT DETECTED Final   Influenza A NOT DETECTED NOT DETECTED Final   Influenza B NOT DETECTED NOT DETECTED Final   Parainfluenza Virus 1 NOT DETECTED NOT DETECTED Final   Parainfluenza Virus 2 NOT DETECTED NOT DETECTED Final   Parainfluenza Virus 3 NOT DETECTED NOT DETECTED Final   Parainfluenza Virus 4 NOT DETECTED NOT DETECTED Final   Respiratory Syncytial Virus NOT DETECTED NOT DETECTED Final   Bordetella pertussis NOT DETECTED NOT DETECTED Final   Chlamydophila pneumoniae NOT DETECTED NOT DETECTED Final   Mycoplasma pneumoniae NOT DETECTED NOT DETECTED Final    Comment: Performed at [facility], [address] [place], [place] [address]     Radiology Studies: No results found.   Scheduled Meds: . acetaZOLAMIDE  250 mg Oral BID  . aspirin EC  81 mg Oral Daily  . atorvastatin  20 mg Oral QPM  . Chlorhexidine Gluconate Cloth  6 each Topical Daily  . enoxaparin (LOVENOX) injection  70 mg Subcutaneous Q24H  . escitalopram  20 mg Oral Daily  . fesoterodine  8 mg Oral QHS  . gabapentin  400 mg Oral TID  . hydrALAZINE  37.5 mg Oral TID  . insulin aspart  0-20 Units Subcutaneous TID WC  . insulin aspart  0-5 Units Subcutaneous QHS  . insulin aspart  10 Units Subcutaneous TID WC  . insulin glargine  55 Units Subcutaneous BID  . ipratropium-albuterol  3 mL Nebulization Q4H  . levothyroxine  175 mcg Oral Q0600  . methylPREDNISolone (SOLU-MEDROL) injection  40 mg Intravenous Q12H  . metolazone  2.5 mg Oral Daily  . multivitamin with minerals  1 tablet Oral Daily  . polyethylene glycol  17 g Oral QHS  . potassium chloride SA  20 mEq Oral Daily  . pramipexole  0.5 mg Oral Daily  . pramipexole  1 mg Oral QHS  . spironolactone  25 mg Oral Daily  . tamsulosin  0.4 mg Oral QHS   Continuous Infusions: . sodium chloride 10 mL/hr at 10/06/19 1218  . ceFEPime (MAXIPIME) IV 1 g (10/07/19 0911)  . doxycycline (VIBRAMYCIN) IV 100 mg (10/07/19 1111)  . furosemide 120 mg (10/07/19 1624)     LOS: 3 days   Marylu Lund, MD [facility] Pager On Amion  If 7PM-7AM, please contact night-coverage 10/07/2019, 5:20 PM

## 2019-10-07 NOTE — Progress Notes (Addendum)
Advanced Heart Failure Rounding Note  PCP-Cardiologist: Mertie Moores, MD   Subjective:    Feels better today. Reports good UOP.    Objective:   Weight Range: 135.2 kg Body mass index is 39.32 kg/m.   Vital Signs:   Temp:  [97.6 F (36.4 C)-98.7 F (37.1 C)] 97.6 F (36.4 C) (10/09 0455) Pulse Rate:  [85-87] 87 (10/08 1314) Resp:  [20-28] 21 (10/09 1000) BP: (114-130)/(25-81) 114/25 (10/09 0835) SpO2:  [88 %-95 %] 94 % (10/09 1000) FiO2 (%):  [50 %] 50 % (10/09 0746) Weight:  [135.2 kg] 135.2 kg (10/09 0455) Last BM Date: 10/04/19(small bowel movement this morning per patient )  Weight change: Filed Weights   10/05/19 0406 10/06/19 0428 10/07/19 0455  Weight: (!) 139.6 kg (!) 137.5 kg 135.2 kg    Intake/Output:   Intake/Output Summary (Last 24 hours) at 10/07/2019 1028 Last data filed at 10/07/2019 0835 Gross per 24 hour  Intake 1925.69 ml  Output 3650 ml  Net -1724.31 ml      Physical Exam    PHYSICAL EXAM: General: Chronically ill sitting in chair  Morbidly obese elderly WM on high flow O2 HEENT: normal anicteric  Neck: thick neck. Elevated JVD. Carotids 2+ bilat; no bruits. No lymphadenopathy or thyromegaly appreciated. Cor: PMI nondisplaced. Regular rate & rhythm. No rubs, gallops or murmurs. Lungs: bibasilar crackles no wheeze Abdomen: markedly obese abdomen, edematous, nontender, + distended. No hepatosplenomegaly. No bruits or masses. Good bowel sounds. Extremities: no cyanosis, clubbing, rash, 2+ bilateral LEE w/ LE wounds Neuro: alert & oriented x 3, cranial nerves grossly intact. moves all 4 extremities w/o difficulty. Affect pleasant   Telemetry   NSR 80s. No arrhthymias.   Labs    CBC Recent Labs    10/06/19 0252 10/07/19 0346  WBC 7.4 8.6  HGB 12.1* 12.0*  HCT 38.0* 36.6*  MCV 91.3 89.5  PLT 179 [id number]   Basic Metabolic Panel Recent Labs    10/06/19 0252 10/07/19 0346  NA 135 134*  K 4.3 3.8  CL 88* 83*  CO2 35* 37*   GLUCOSE 378* 362*  BUN 92* 101*  CREATININE 2.56* 2.50*  CALCIUM 8.9 9.2  MG 2.4 2.4   Liver Function Tests No results for input(s): AST, ALT, ALKPHOS, BILITOT, PROT, ALBUMIN in the last 72 hours. No results for input(s): LIPASE, AMYLASE in the last 72 hours. Cardiac Enzymes No results for input(s): CKTOTAL, CKMB, CKMBINDEX, TROPONINI in the last 72 hours.  BNP: BNP (last 3 results) Recent Labs    08/03/19 1040 09/15/19 2138 10/03/19 2250  BNP 70.1 28.6 51.6    ProBNP (last 3 results) No results for input(s): PROBNP in the last 8760 hours.   D-Dimer No results for input(s): DDIMER in the last 72 hours. Hemoglobin A1C No results for input(s): HGBA1C in the last 72 hours. Fasting Lipid Panel No results for input(s): CHOL, HDL, LDLCALC, TRIG, CHOLHDL, LDLDIRECT in the last 72 hours. Thyroid Function Tests No results for input(s): TSH, T4TOTAL, T3FREE, THYROIDAB in the last 72 hours.  Invalid input(s): FREET3  Other results:   Imaging    No results found.   Medications:     Scheduled Medications: . aspirin EC  81 mg Oral Daily  . atorvastatin  20 mg Oral QPM  . Chlorhexidine Gluconate Cloth  6 each Topical Daily  . enoxaparin (LOVENOX) injection  70 mg Subcutaneous Q24H  . escitalopram  20 mg Oral Daily  . fesoterodine  8 mg Oral  QHS  . gabapentin  400 mg Oral TID  . hydrALAZINE  37.5 mg Oral TID  . insulin aspart  0-5 Units Subcutaneous QHS  . insulin aspart  0-9 Units Subcutaneous TID WC  . insulin aspart  8 Units Subcutaneous TID WC  . insulin glargine  45 Units Subcutaneous BID  . ipratropium-albuterol  3 mL Nebulization Q4H  . levothyroxine  175 mcg Oral Q0600  . methylPREDNISolone (SOLU-MEDROL) injection  40 mg Intravenous Q12H  . metolazone  2.5 mg Oral Daily  . multivitamin with minerals  1 tablet Oral Daily  . polyethylene glycol  17 g Oral QHS  . potassium chloride SA  20 mEq Oral Daily  . pramipexole  0.5 mg Oral Daily  . pramipexole  1 mg  Oral QHS  . spironolactone  25 mg Oral Daily  . tamsulosin  0.4 mg Oral QHS    Infusions: . sodium chloride 10 mL/hr at 10/06/19 1218  . ceFEPime (MAXIPIME) IV 1 g (10/07/19 0911)  . doxycycline (VIBRAMYCIN) IV 100 mg (10/06/19 2214)  . furosemide 120 mg (10/07/19 0453)    PRN Medications: sodium chloride, acetaminophen **OR** acetaminophen, albuterol, ipratropium-albuterol, ondansetron **OR** ondansetron (ZOFRAN) IV    Assessment/Plan   1. A/C Hypoxic Respiratory Failure - multifactorial with  COPD/ diastolic HF Possible CAP.  Remains SOB at rest. On supplemental O2. High flow O2.  He is DNI.  - Hopefully will improve with high dose IV diuresis.  -Blood Cx - NGTD -Lactic Acid 1.6  -On empiric antibiotics for possible CAP. Cefepime   2. A/C Diastolic Heart Failure -Most recent ECHO LVEF 60-65% with normal RV.  -Significant lower extremity edmea.  -Responding well to diuretics. -3.6L out yesterday. Net I/os -5.5L since admit. Wt down 5lb, from 303>>298 lb -Scr stable. K 3.8  -Continue IV lasix 120 mg twice a day + metolazone.  -Reinforced he needs to limit fluid intake. Fluid restrict order in for < 1200 ml/day   3. CKD Stage IV -Creatinine baseline 2-2.2 -On admit creatinine 2.5. -Stable past 24 hrs w/ diuresis, 2.5 today -Watch renal function closely w/ diuresis .   4. LLE Erythema, Possible cellulitis.  On antibiotics.  WBC is not elevated.   5 Hypothyroidism  TSH 1.9  Continue levothyroxine.   6. OSA Uses Bipap nightly at home.  7. CAD . Non-obstructive. LHC 08/2015: LM 40, LAD distal 40, LCx ostial 50, RCA proximal 40 - No chest - On ASA/statin  -Offb-blocker   8. DMII Management per IM. On insulin   9. UTI Urine CX- GNR with final speciation pending. On IV doxycycline.  Per primary team.   Length of Stay: [address] [place]hal Coria  10/07/2019, 10:28 AM  Advanced Heart Failure Team Pager [phone] (M-F; 7a - 4p)  Please contact [facility] for night-coverage after hours (4p -7a ) and weekends on [email or web address]  Patient seen and examined with the above-signed Advanced Practice Provider and/or Housestaff. I personally reviewed laboratory data, imaging studies and relevant notes. I independently examined the patient and formulated the important aspects of the plan. I have edited the note to reflect any of my changes or salient points. I have personally discussed the plan with the patient and/or family.  Respiratory status improving with diuresis but still SOB and very weak. UCx with recurrent pseudomonas on cefipime. Creatinine elevated but stable. Co2 climbing. Will continue IV lasix and metolazone. Add acetazolamide. Overall very, very tenuous but resistant to [place] discussions at this point. Says he  wants to return home.   Glori Bickers, MD  2:29 PM

## 2019-10-07 NOTE — Progress Notes (Signed)
Pt states he prefers to stay on HFNC instead of going on BIPAP tonight. Pt looks comfortable at this time on HFNC. RT will continue to monitor

## 2019-10-07 NOTE — Progress Notes (Signed)
Pt CBG 508. Wyline Copas, MD paged and made aware of results. RN waiting for orders. Will continue to monitor.

## 2019-10-07 NOTE — Care Management Important Message (Signed)
Important Message  Patient Details  Name: KAIMANA PARKER MRN: [id number] Date of Birth: 1946-07-31   Medicare Important Message Given:  Yes     Shelda Altes 10/07/2019, 1:27 PM

## 2019-10-07 NOTE — Progress Notes (Signed)
Wyline Copas, MD notified of CBG of 413. New orders received.

## 2019-10-07 NOTE — TOC Initial Note (Addendum)
Transition of Care [facility]) - Initial/Assessment Note    Patient Details  Name: Travis [name] MRN: [id number] Date of Birth: 06/07/1946  [facility] CM/SW Contact:    Maryclare Labrador, RN Phone Number: 10/07/2019, 10:39 AM  Clinical Narrative:    CM was unable to reach pt via phone (CM working remote).  CM was able to reach wife;  she said pt will likely refuse SNF due to covid outbreaks, PT ordered.  Pt has following equipment;  electric and manual wheelchair , 3:1 - pt would benefit from a lift in the home as wife can not lift him.  Per wife, pt does not want anyone to transport him other than wife - which is difficult because she cant lift him so she has to have one of her children get him out of the wheelchair into the car and then back into the wheelchair when he gets to his destination.   Pt is active with [facility] for RN, Aide and PT - CM will request resumption order.   Pt is now not able to walk at all per wife due to resp issue but prior to that he could walk a few steps to allow her to assist with his ADLs.  He is active with [facility] and the Dr has been making house calls.  CM spoke with HF team - pt is considered to be noncompliant once home and palliative consult being considered.          Update:  Pt informed CM that he would think about a SNF placement if recommended, Pt has not yet seen pt   Expected Discharge Plan: [place] Barriers to Discharge: Continued Medical Work up   Patient Goals and CMS Choice        Expected Discharge Plan and Services Expected Discharge Plan: [place]       Living arrangements for the past 2 months: Single Family Home                                      Prior Living Arrangements/Services Living arrangements for the past 2 months: Single Family Home Lives with:: Spouse Patient language and need for interpreter reviewed:: Yes        Need for Family Participation in  Patient Care: Yes (Comment) Care giver support system in place?: Yes (comment) Current home services: DME, Homehealth aide, Home PT, Home RN(Active wth [facility]) Criminal Activity/Legal Involvement Pertinent to Current Situation/Hospitalization: No - Comment as needed  Activities of Daily Living Home Assistive Devices/Equipment: BIPAP, CBG Meter, Electric scooter, Eyeglasses, Shower chair with back, Environmental consultant (specify type), Wheelchair ADL Screening (condition at time of admission) Patient's cognitive ability adequate to safely complete daily activities?: Yes Is the patient deaf or have difficulty hearing?: No Does the patient have difficulty seeing, even when wearing glasses/contacts?: No Does the patient have difficulty concentrating, remembering, or making decisions?: No Patient able to express need for assistance with ADLs?: Yes Does the patient have difficulty dressing or bathing?: Yes Independently performs ADLs?: No Does the patient have difficulty walking or climbing stairs?: Yes Weakness of Legs: Both Weakness of Arms/Hands: None  Permission Sought/Granted                  Emotional Assessment           Psych Involvement: No (comment)  Admission  diagnosis:  Respiratory distress [R06.03] Obesity hypoventilation syndrome ([place]) [E66.2] Acute respiratory failure with hypoxia ([place]) [J96.01] Patient Active Problem List   Diagnosis Date Noted  . Acute respiratory failure with hypoxia ([place]) 10/04/2019  . Pressure injury of sacral region, stage 2 ([place]) 09/17/2019  . Obesity hypoventilation syndrome ([place]) 09/17/2019  . Wheelchair bound- can only stand and pivot 09/17/2019  . Acute respiratory distress 09/16/2019  . Chest pain 09/16/2019  . Lower extremity edema   . Acute metabolic encephalopathy [id number]  . Acute CHF (congestive heart failure) ([place]) 07/13/2019  . Anasarca 05/01/2019  . Cellulitis of lower extremity 05/01/2019  . Depression 05/01/2019  .  RLS (restless legs syndrome) 05/01/2019  . Type 2 diabetes mellitus with stage 3 chronic kidney disease, with long-term current use of insulin ([place]) 04/15/2019  . Insulin dependent diabetes mellitus 04/15/2019  . Pressure injury of skin 11/21/2018  . Complicated urinary tract infection 11/20/2018  . Acute encephalopathy 06/28/2018  . Oxygen desaturation   . Supplemental oxygen dependent   . Type 2 diabetes mellitus with hyperglycemia, with long-term current use of insulin ([place])   . Labile blood pressure   . Labile blood glucose   . Myelopathy ([place]) 04/16/2018  . Benign essential HTN   . Benign prostatic hyperplasia with urinary retention   . Acute on chronic respiratory failure with hypoxia and hypercapnia (HCC)   . DDD (degenerative disc disease), cervical   . Obesity, Class III, BMI 40-49.9 (morbid obesity) ([place])   . S/P lumbar spinal fusion 04/08/2018  . 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 Myelopathy, spondylogenic, cervical 11/11/2017  . Excessive daytime sleepiness 11/11/2017  . 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 Debility 10/08/2017  . Stage 3 chronic kidney disease   . Spinal stenosis of lumbar region   . Acute blood loss anemia   . Urinary tract infection without hematuria   . Acute on chronic diastolic CHF (congestive heart failure) ([place])   . Elevated troponin I level   . Hypothyroid 08/19/2015  . Restless legs syndrome 03/14/2015  . Shortness of breath 03/09/2015  . Sinus bradycardia 09/21/2014  . Thoracic spondylosis with myelopathy 09/13/2014  . Cervical arthritis with myelopathy 07/18/2014  . Constipation - functional   . Sepsis ([place]) 07/10/2014  . Edema leg 07/10/2014  . Lumbosacral spinal stenosis 03/13/2014  . Other vitamin B12 deficiency anemia 07/13/2013  . 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 Polyneuropathy in other diseases classified elsewhere ([place]) 02/17/2013  . Other  general symptoms(780.99) 02/17/2013  . Abnormality of gait 02/17/2013  . Cervical spondylosis with myelopathy 02/17/2013  . Back ache 09/15/2012   PCP:  [name], MD Pharmacy:   [place], [place] - [address] [place] [address] Phone: [phone] Fax: [phone]  [place] [place] [address] Phone: [phone] Fax: [phone]     Social Determinants of Health (SDOH) Interventions    Readmission Risk Interventions Readmission Risk Prevention Plan 07/18/2019  Transportation Screening Complete  PCP or Specialist Appt within 3-5 Days Complete  HRI or [place] Complete  Social Work Consult for [place] Planning/Counseling Complete  Palliative Care Screening Not Applicable  Medication Review Press photographer) Complete  Some recent data might be hidden

## 2019-10-07 NOTE — Plan of Care (Signed)

## 2019-10-07 NOTE — Progress Notes (Signed)
Patient states he wants to remain on the HFNC tonight. He does not want a CPAP or BiPAP(machine at bedside on standby). Informed patient that if he changes his mind to let RN know.

## 2019-10-07 NOTE — Evaluation (Signed)
Physical Therapy Evaluation Patient Details Name: Travis [name] MRN: [id number] DOB: 15-Nov-1946 Today's Date: 10/07/2019   History of Present Illness  Pt adm with acute hypoxic respiratory faliure due to copd and heart failure. Pt on HFNC. PMH - morbid obesity, ckd, dm, diastolic heart failure, copd, peripheral neuropathy, multiple back surgeries, cervical surgery.   Clinical Impression  Pt well known to PT department with frequent admissions and limited mobility. Able to get pt to chair using Stedy. Pt will not consider SNF. Recommend hoyer lift for home as per case management note wife is having difficulty assisting pt. Pt has refused ambulance transport home in the past and per case management note this will be the case again. Feel if pt chooses to refuse amb transport home that family needs to come to hospital to assist him into car and that staff should not have to perform this transfer due to high risk for staff injury.     Follow Up Recommendations Home health PT;Supervision/Assistance - 24 hour    Equipment Recommendations  Other (comment)(hoyer lift)    Recommendations for Other Services       Precautions / Restrictions Precautions Precautions: Fall      Mobility  Bed Mobility               General bed mobility comments: Pt sitting up at EOB  Transfers Overall transfer level: Needs assistance Equipment used: Ambulation equipment used Transfers: Sit to/from Stand Sit to Stand: +2 physical assistance;Mod assist;From elevated surface         General transfer comment: Elevated bed 6-8 inches and used Stedy to assist pt to stand. Able to perform with 1 person mod assist. Pt is slow to rise and isn't able to get feet back far enough under him due to decr knee flexion so he pulls up with trunk flexed and then brings feet underneath him and straightens up trunk. Stedy for bed to bsc to recliner. Needed 2 assist to come up from bsc. Maximove sling in place in recliner  if pt unable to stand with stedy from recliner.  Ambulation/Gait                Stairs            Wheelchair Mobility    Modified Rankin (Stroke Patients Only)       Balance Overall balance assessment: Needs assistance Sitting-balance support: No upper extremity supported;Feet supported Sitting balance-Leahy Scale: Fair     Standing balance support: Bilateral upper extremity supported Standing balance-Leahy Scale: Poor Standing balance comment: Stedy and min guard for static standing                             Pertinent Vitals/Pain Pain Assessment: No/denies pain    Home Living Family/patient expects to be discharged to:: Private residence Living Arrangements: Spouse/significant other Available Help at Discharge: Family;Available 24 hours/day;Personal care attendant Type of Home: House Home Access: Ramped entrance     Home Layout: One level Home Equipment: [name] - 2 wheels;Walker - 4 wheels;Cane - single point;Bedside commode;Wheelchair - manual;Shower seat - built in;Grab bars - tub/shower;Hospital bed;Other (comment);Grab bars - toilet;Wheelchair - power      Prior Function Level of Independence: Needs assistance   Gait / Transfers Assistance Needed: Assist for transfers to w/c  ADL's / Homemaking Assistance Needed: Pt wife assists with ADL's including bathing and dressing        Hand Dominance  Dominant Hand: Right    Extremity/Trunk Assessment   Upper Extremity Assessment Upper Extremity Assessment: Generalized weakness    Lower Extremity Assessment Lower Extremity Assessment: Generalized weakness       Communication   Communication: No difficulties  Cognition Arousal/Alertness: Awake/alert Behavior During Therapy: WFL for tasks assessed/performed Overall Cognitive Status: Within Functional Limits for tasks assessed                                        General Comments General comments (skin  integrity, edema, etc.): Pt on HFNC 50% with SpO2 >90%    Exercises     Assessment/Plan    PT Assessment Patient needs continued PT services  PT Problem List Decreased strength;Decreased range of motion;Decreased activity tolerance;Decreased balance;Decreased mobility;Cardiopulmonary status limiting activity;Obesity       PT Treatment Interventions DME instruction;Gait training;Functional mobility training;Therapeutic activities;Therapeutic exercise;Balance training;Patient/family education    PT Goals (Current goals can be found in the Care Plan section)  Acute Rehab PT Goals Patient Stated Goal: return home PT Goal Formulation: With patient Time For Goal Achievement: 10/21/19 Potential to Achieve Goals: Good    Frequency Min 3X/week   Barriers to discharge        Co-evaluation               AM-PAC PT "6 Clicks" Mobility  Outcome Measure Help needed turning from your back to your side while in a flat bed without using bedrails?: Total Help needed moving from lying on your back to sitting on the side of a flat bed without using bedrails?: Total Help needed moving to and from a bed to a chair (including a wheelchair)?: Total Help needed standing up from a chair using your arms (e.g., wheelchair or bedside chair)?: Total Help needed to walk in hospital room?: Total Help needed climbing 3-5 steps with a railing? : Total 6 Click Score: 6    End of Session Equipment Utilized During Treatment: Oxygen Activity Tolerance: Patient limited by fatigue Patient left: in chair;with call bell/phone within reach Nurse Communication: Mobility status;Need for lift equipment(nurse assisted with [name]) PT Visit Diagnosis: Other abnormalities of gait and mobility (R26.89);Muscle weakness (generalized) (M62.81);Difficulty in walking, not elsewhere classified (R26.2)    Time: [id number] PT Time Calculation (min) (ACUTE ONLY): 29 min   Charges:   PT Evaluation $PT Eval Moderate  Complexity: [place] Pager [phone] Office [place] 10/07/2019, 5:25 PM

## 2019-10-08 LAB — CBC
HCT: 37.6 % — ABNORMAL LOW (ref 39.0–52.0)
Hemoglobin: 11.9 g/dL — ABNORMAL LOW (ref 13.0–17.0)
MCH: 28.7 pg (ref 26.0–34.0)
MCHC: 31.6 g/dL (ref 30.0–36.0)
MCV: 90.8 fL (ref 80.0–100.0)
Platelets: 166 10*3/uL (ref 150–400)
RBC: 4.14 MIL/uL — ABNORMAL LOW (ref 4.22–5.81)
RDW: 16.2 % — ABNORMAL HIGH (ref 11.5–15.5)
WBC: 7.6 10*3/uL (ref 4.0–10.5)
nRBC: 0 % (ref 0.0–0.2)

## 2019-10-08 LAB — MAGNESIUM: Magnesium: 2.4 mg/dL (ref 1.7–2.4)

## 2019-10-08 LAB — BASIC METABOLIC PANEL
Anion gap: 19 — ABNORMAL HIGH (ref 5–15)
Anion gap: 14 (ref 5–15)
BUN: 114 mg/dL — ABNORMAL HIGH (ref 8–23)
BUN: 114 mg/dL — ABNORMAL HIGH (ref 8–23)
CO2: 33 mmol/L — ABNORMAL HIGH (ref 22–32)
CO2: 37 mmol/L — ABNORMAL HIGH (ref 22–32)
Calcium: 9.4 mg/dL (ref 8.9–10.3)
Calcium: 9.3 mg/dL (ref 8.9–10.3)
Chloride: 82 mmol/L — ABNORMAL LOW (ref 98–111)
Chloride: 85 mmol/L — ABNORMAL LOW (ref 98–111)
Creatinine, Ser: 2.61 mg/dL — ABNORMAL HIGH (ref 0.61–1.24)
Creatinine, Ser: 2.45 mg/dL — ABNORMAL HIGH (ref 0.61–1.24)
GFR calc Af Amer: 27 mL/min — ABNORMAL LOW (ref >60)
GFR calc Af Amer: 29 mL/min — ABNORMAL LOW (ref >60)
GFR calc non Af Amer: 23 mL/min — ABNORMAL LOW (ref >60)
GFR calc non Af Amer: 25 mL/min — ABNORMAL LOW (ref >60)
Glucose, Bld: 391 mg/dL — ABNORMAL HIGH (ref 70–99)
Glucose, Bld: 353 mg/dL — ABNORMAL HIGH (ref 70–99)
Potassium: 4 mmol/L (ref 3.5–5.1)
Potassium: 3.9 mmol/L (ref 3.5–5.1)
Sodium: 134 mmol/L — ABNORMAL LOW (ref 135–145)
Sodium: 136 mmol/L (ref 135–145)

## 2019-10-08 LAB — BLOOD GAS, ARTERIAL
Acid-Base Excess: 12.9 mmol/L — ABNORMAL HIGH (ref 0.0–2.0)
Bicarbonate: 38.4 mmol/L — ABNORMAL HIGH (ref 20.0–28.0)
Drawn by: 283381
FIO2: 50
O2 Content: 25 L/min
O2 Saturation: 93.4 %
Patient temperature: 97.4
pCO2 arterial: 62.8 mmHg — ABNORMAL HIGH (ref 32.0–48.0)
pH, Arterial: 7.399 (ref 7.350–7.450)
pO2, Arterial: 68.7 mmHg — ABNORMAL LOW (ref 83.0–108.0)

## 2019-10-08 LAB — GLUCOSE, CAPILLARY
Glucose-Capillary: 203 mg/dL — ABNORMAL HIGH (ref 70–99)
Glucose-Capillary: 277 mg/dL — ABNORMAL HIGH (ref 70–99)
Glucose-Capillary: 402 mg/dL — ABNORMAL HIGH (ref 70–99)
Glucose-Capillary: 410 mg/dL — ABNORMAL HIGH (ref 70–99)
Glucose-Capillary: 416 mg/dL — ABNORMAL HIGH (ref 70–99)

## 2019-10-08 MED ORDER — INSULIN GLARGINE 100 UNIT/ML ~~LOC~~ SOLN: 70.0000 [IU] | Freq: Two times a day (BID) | SUBCUTANEOUS | Status: DC

## 2019-10-08 MED ORDER — INSULIN ASPART 100 UNIT/ML ~~LOC~~ SOLN: 12.0000 [IU] | Freq: Three times a day (TID) | SUBCUTANEOUS | Status: DC

## 2019-10-08 MED ORDER — INSULIN ASPART 100 UNIT/ML ~~LOC~~ SOLN
15.0000 [IU] | Freq: Three times a day (TID) | SUBCUTANEOUS | Status: DC
  Administered 2019-10-08 – 2019-10-11 (×7): 15 [IU] via SUBCUTANEOUS

## 2019-10-08 MED ORDER — GABAPENTIN 100 MG PO CAPS
200.0000 mg | ORAL_CAPSULE | Freq: Three times a day (TID) | ORAL | Status: DC
  Administered 2019-10-08 – 2019-10-12 (×13): 200 mg via ORAL
  Filled 2019-10-08 (×13): qty 2

## 2019-10-08 MED ORDER — INSULIN GLARGINE 100 UNIT/ML ~~LOC~~ SOLN
65.0000 [IU] | Freq: Two times a day (BID) | SUBCUTANEOUS | Status: DC
  Filled 2019-10-08 (×2): qty 0.65

## 2019-10-08 MED ORDER — INSULIN GLARGINE 100 UNIT/ML ~~LOC~~ SOLN
65.0000 [IU] | Freq: Two times a day (BID) | SUBCUTANEOUS | Status: DC
  Administered 2019-10-08 (×2): 65 [IU] via SUBCUTANEOUS
  Filled 2019-10-08 (×4): qty 0.65

## 2019-10-08 MED ORDER — INSULIN ASPART 100 UNIT/ML ~~LOC~~ SOLN: 15.0000 [IU] | Freq: Three times a day (TID) | SUBCUTANEOUS | Status: DC

## 2019-10-08 NOTE — Progress Notes (Signed)
   10/08/19 1120  BiPAP/CPAP/SIPAP  BiPAP/CPAP/SIPAP Pt Type Adult  Mask Type Full face mask  Mask Size Large  Set Rate 10 breaths/min  Respiratory Rate 16 breaths/min  IPAP 16 cmH20  EPAP 8 cmH2O  Oxygen Percent 40 %  Minute Ventilation 9.4  Leak 0  Peak Inspiratory Pressure (PIP) 16  Tidal Volume (Vt) 560  BiPAP/CPAP/SIPAP BiPAP  Press High Alarm 25 cmH2O  Press Low Alarm 5 cmH2O  BiPAP/CPAP /SiPAP Vitals  Resp 19  SpO2 97 %  Pt placed on Bipap on above settings due to ABG results and increased drowsiness.  Pt tolerating well, RT will cont to monitor.

## 2019-10-08 NOTE — Progress Notes (Addendum)
PROGRESS NOTE    Travis [name]  [id number] DOB: 10/21/1946 DOA: 10/03/2019 PCP: [name], MD    Brief Narrative:  73 y.o. male past medical history of chronic diastolic heart failure, diabetes mellitus type 2, COPD essential hypertension obesity hypoventilation syndrome BiPAP, chronic kidney disease with a baseline creatinine around 2 with a recent admission for heart failure,comes in for shortness of breath in the ED he was found to be hypoxic requiring a nonrebreather chest x-ray showed bilateral atelectasis COVID-19 PCR was negative, on exam he has JVD with lower extremity edema.  Assessment & Plan:   Principal Problem:   Acute respiratory failure with hypoxia (HCC) Active Problems:   Tetraparesis (HCC)   Obesity, Class III, BMI 40-49.9 (morbid obesity) (HCC)   Type 2 diabetes mellitus with stage 3 chronic kidney disease, with long-term current use of insulin (HCC)   Acute CHF (congestive heart failure) (HCC)   Pressure injury of sacral region, stage 2 (HCC)   Obesity hypoventilation syndrome (HCC)   Acute respiratory failure with hypoxia due to chronic diastolic heart failure and possibly due to acute COPD exacerbation.: -Cardiology following. Recommendation to diurese, with addition of acetazolamide -Continued on IV Rocephin and IV doxycycline for possible community-acquired pneumonia. -Pt is continued on IV steroids inhalers and continue on ongoing antibiotics for possible COPD exacerbation. -On 10/8, pt noted to have increased work of breathing and increased O2 requirements, requiring bipap. Pt continues to have good diuresis -Had been noncompliant with O2 while on floor and asking to continue high flow O2 in lieu of Bipap overnight -This AM, pt with O2 prongs out of nose and later in AM, pt noted to be more lethargic. ABG ordered and reviewed. Findings of chronic hypercarbia. Pt was continued on bipap with improvement in mentation -Cont to wean O2 as tolerated. .   Acute kidney injury on chronic kidney disease stage III with baseline creatinine around 2. -Cont with good urine output, tolerating diuretics -Renal function remains stable thus far -Recheck bmet in AM  Diabetes mellitus type 2 insulin-dependent: -Glucose poorly controlled with concurrent IV steroids -Despite increasing insulin dose, glucose remains very poorly controlled in the 400's -Continue SSI at resistant scale -Have further increased lantus to 65 units bid with 15 units meal coverage -Pt's wife at bedside reports home lantus at 72 units daily with 22 units meal coverage with glucose still remaining in the 300-400's  Hyperlipidemia: -continued on statin -Seems to be stable at this time  Depression: -Continue Lexapro as pt tolerates  Hypothyroidism: -Pt is continued on Synthroid 167mcg daily  Essential hypertension: -Continue IV Lasix with acetazolamide -Obesity hypoventilation syndrome on BiPAP at home, has not been wearing CPAP or BiPAP consistently on floor  Pressure ulcer stage II present on admission. -Seems stable at this time  Pseudomonas bacteriuria -UA not suggestive of UTI with no WBCs noted -Urine cx is pos for pseudomonas resistant to cipro and gen -Pt already on cefepime for above concerns of PNA  DVT prophylaxis: Lovenox subq Code Status: Full Family Communication: Pt in room, family at bedside Disposition Plan: Uncertain at this time  Consultants:   Cardiology  Procedures:     Antimicrobials: Anti-infectives (From admission, onward)   Start     Dose/Rate Route Frequency Ordered Stop   10/06/19 0900  ceFEPIme (MAXIPIME) 1 g in sodium chloride 0.9 % 100 mL IVPB     1 g 200 mL/hr over 30 Minutes Intravenous Daily 10/06/19 0755     10/05/19 1645  cefTRIAXone (  ROCEPHIN) 1 g in sodium chloride 0.9 % 100 mL IVPB  Status:  Discontinued     1 g 200 mL/hr over 30 Minutes Intravenous Every 24 hours 10/05/19 1631 10/05/19 1633   10/05/19 1000   doxycycline (VIBRAMYCIN) 100 mg in sodium chloride 0.9 % 250 mL IVPB     100 mg 125 mL/hr over 120 Minutes Intravenous Every 12 hours 10/05/19 0927 10/11/19 0959   10/05/19 0930  doxycycline (VIBRAMYCIN) 100 mg in sodium chloride 0.9 % 250 mL IVPB  Status:  Discontinued     100 mg 125 mL/hr over 120 Minutes Intravenous Every 12 hours 10/05/19 0920 10/05/19 0927   10/04/19 1200  ceFEPIme (MAXIPIME) 2 g in sodium chloride 0.9 % 100 mL IVPB  Status:  Discontinued     2 g 200 mL/hr over 30 Minutes Intravenous Every 12 hours 10/04/19 1127 10/05/19 0920   10/03/19 2300  cefTRIAXone (ROCEPHIN) 2 g in sodium chloride 0.9 % 100 mL IVPB  Status:  Discontinued     2 g 200 mL/hr over 30 Minutes Intravenous Every 24 hours 10/03/19 2255 10/04/19 1057   10/03/19 2300  azithromycin (ZITHROMAX) 500 mg in sodium chloride 0.9 % 250 mL IVPB  Status:  Discontinued     500 mg 250 mL/hr over 60 Minutes Intravenous Every 24 hours 10/03/19 2255 10/05/19 0919      Subjective: Claims to be breathing better this AM. Later noted to be more lethargic, needing biapap  Objective: Vitals:   10/08/19 1119 10/08/19 1120 10/08/19 1316 10/08/19 1318  BP:   110/78   Pulse:      Resp:  19 19 (!) 21  Temp:   97.7 F (36.5 C)   TempSrc:   Axillary   SpO2: 96% 97% 95%   Weight:      Height:        Intake/Output Summary (Last 24 hours) at 10/08/2019 1518 Last data filed at 10/08/2019 1316 Gross per 24 hour  Intake 500 ml  Output 4175 ml  Net -3675 ml   Filed Weights   10/06/19 0428 10/07/19 0455 10/08/19 0410  Weight: (!) 137.5 kg 135.2 kg 134.2 kg    Examination: General exam: Awake, laying in bed, in nad Respiratory system: Normal respiratory effort, no wheezing Cardiovascular system: regular rate, s1, s2 Gastrointestinal system: Soft, nondistended, positive BS Central nervous system: CN2-12 grossly intact, strength intact Extremities: Perfused, no clubbing Skin: Normal skin turgor, no notable skin  lesions seen Psychiatry: Mood normal // no visual hallucinations   Data Reviewed: I have personally reviewed following labs and imaging studies  CBC: Recent Labs  Lab 10/03/19 2250 10/04/19 0825 10/04/19 1125 10/06/19 0252 10/07/19 0346 10/08/19 0414  WBC 9.3 8.5  --  7.4 8.6 7.6  NEUTROABS 8.0* 7.0  --   --   --   --   HGB 14.1 12.7* 12.9* 12.1* 12.0* 11.9*  HCT 42.8 40.1 38.0* 38.0* 36.6* 37.6*  MCV 91.1 93.0  --  91.3 89.5 90.8  PLT 218 199  --  179 168 [id number]   Basic Metabolic Panel: Recent Labs  Lab 10/04/19 0825  10/05/19 0926 10/06/19 0252 10/07/19 0346 10/08/19 0414 10/08/19 1200  NA 137   < > 137 135 134* 134* 136  K 4.0   < > 4.3 4.3 3.8 4.0 3.9  CL 88*  --  85* 88* 83* 82* 85*  CO2 34*  --  37* 35* 37* 33* 37*  GLUCOSE 196*  --  318* 378*  362* 391* 353*  BUN 99*  --  89* 92* 101* 114* 114*  CREATININE 2.45*  --  2.39* 2.56* 2.50* 2.61* 2.45*  CALCIUM 8.8*  --  9.1 8.9 9.2 9.4 9.3  MG 2.7*  --  2.5* 2.4 2.4 2.4  --    < > = values in this interval not displayed.   GFR: Estimated Creatinine Clearance: 38 mL/min (A) (by C-G formula based on SCr of 2.45 mg/dL (H)). Liver Function Tests: Recent Labs  Lab 10/03/19 2250 10/04/19 0825  AST 19 18  ALT 26 25  ALKPHOS 108 89  BILITOT 0.3 0.6  PROT 7.5 6.5  ALBUMIN 3.5 3.1*   No results for input(s): LIPASE, AMYLASE in the last 168 hours. No results for input(s): AMMONIA in the last 168 hours. Coagulation Profile: Recent Labs  Lab 10/03/19 2250  INR 1.0   Cardiac Enzymes: No results for input(s): CKTOTAL, CKMB, CKMBINDEX, TROPONINI in the last 168 hours. BNP (last 3 results) No results for input(s): PROBNP in the last 8760 hours. HbA1C: No results for input(s): HGBA1C in the last 72 hours. CBG: Recent Labs  Lab 10/07/19 1538 10/07/19 2145 10/08/19 0037 10/08/19 0747 10/08/19 1029  GLUCAP 508* 436* 410* 416* 402*   Lipid Profile: No results for input(s): CHOL, HDL, LDLCALC, TRIG, CHOLHDL,  LDLDIRECT in the last 72 hours. Thyroid Function Tests: No results for input(s): TSH, T4TOTAL, FREET4, T3FREE, THYROIDAB in the last 72 hours. Anemia Panel: No results for input(s): VITAMINB12, FOLATE, FERRITIN, TIBC, IRON, RETICCTPCT in the last 72 hours. Sepsis Labs: Recent Labs  Lab 10/03/19 2325 10/05/19 0926 10/06/19 0252 10/07/19 0346  PROCALCITON  --  <0.10 <0.10 <0.10  LATICACIDVEN 1.6  --   --   --     Recent Results (from the past 240 hour(s))  SARS Coronavirus 2 [facility] order, Performed in [facility] hospital lab) Nasopharyngeal Nasopharyngeal Swab     Status: None   Collection Time: 10/03/19 11:27 PM   Specimen: Nasopharyngeal Swab  Result Value Ref Range Status   SARS Coronavirus 2 NEGATIVE NEGATIVE Final    Comment: (NOTE) If result is NEGATIVE SARS-CoV-2 target nucleic acids are NOT DETECTED. The SARS-CoV-2 RNA is generally detectable in upper and lower  respiratory specimens during the acute phase of infection. The lowest  concentration of SARS-CoV-2 viral copies this assay can detect is 250  copies / mL. A negative result does not preclude SARS-CoV-2 infection  and should not be used as the sole basis for treatment or other  patient management decisions.  A negative result may occur with  improper specimen collection / handling, submission of specimen other  than nasopharyngeal swab, presence of viral mutation(s) within the  areas targeted by this assay, and inadequate number of viral copies  (<250 copies / mL). A negative result must be combined with clinical  observations, patient history, and epidemiological information. If result is POSITIVE SARS-CoV-2 target nucleic acids are DETECTED. The SARS-CoV-2 RNA is generally detectable in upper and lower  respiratory specimens dur ing the acute phase of infection.  Positive  results are indicative of active infection with SARS-CoV-2.  Clinical  correlation with patient history and other diagnostic  information is  necessary to determine patient infection status.  Positive results do  not rule out bacterial infection or co-infection with other viruses. If result is PRESUMPTIVE POSTIVE SARS-CoV-2 nucleic acids MAY BE PRESENT.   A presumptive positive result was obtained on the submitted specimen  and confirmed on repeat testing.  While 2019 novel coronavirus  (SARS-CoV-2) nucleic acids may be present in the submitted sample  additional confirmatory testing may be necessary for epidemiological  and / or clinical management purposes  to differentiate between  SARS-CoV-2 and other Sarbecovirus currently known to infect humans.  If clinically indicated additional testing with an alternate test  methodology [id number]) is advised. The SARS-CoV-2 RNA is generally  detectable in upper and lower respiratory sp ecimens during the acute  phase of infection. The expected result is Negative. Fact Sheet for Patients:  StrictlyIdeas.no Fact Sheet for Healthcare Providers: BankingDealers.co.za This test is not yet approved or cleared by the [place] FDA and has been authorized for detection and/or diagnosis of SARS-CoV-2 by FDA under an Emergency Use Authorization (EUA).  This EUA will remain in effect (meaning this test can be used) for the duration of the COVID-19 declaration under Section 564(b)(1) of the Act, 21 U.S.C. section 360bbb-3(b)(1), unless the authorization is terminated or revoked sooner. Performed at [facility], [place] [address], [place], [place] [address]   Blood Culture (routine x 2)     Status: None (Preliminary result)   Collection Time: 10/03/19 11:30 PM   Specimen: BLOOD  Result Value Ref Range Status   Specimen Description BLOOD  Final   Special Requests   Final    BOTTLES DRAWN AEROBIC AND ANAEROBIC Blood Culture adequate volume   Culture   Final    NO GROWTH 4 DAYS Performed at [facility], [place]  [address], [place], [place] [address]    Report Status PENDING  Incomplete  Blood Culture (routine x 2)     Status: None (Preliminary result)   Collection Time: 10/03/19 11:35 PM   Specimen: BLOOD LEFT ARM  Result Value Ref Range Status   Specimen Description BLOOD LEFT ARM  Final   Special Requests   Final    BOTTLES DRAWN AEROBIC AND ANAEROBIC Blood Culture adequate volume   Culture   Final    NO GROWTH 4 DAYS Performed at [facility], [place] [address], [place],  [address]    Report Status PENDING  Incomplete  Urine culture     Status: Abnormal   Collection Time: 10/04/19 12:18 AM   Specimen: In/Out Cath Urine  Result Value Ref Range Status   Specimen Description IN/OUT CATH URINE  Final   Special Requests   Final    NONE Performed at [facility], [place] [address] [place], [place] [address]    Culture >=100,000 COLONIES/mL PSEUDOMONAS AERUGINOSA (A)  Final   Report Status 10/05/2019 FINAL  Final   Organism ID, Bacteria PSEUDOMONAS AERUGINOSA (A)  Final      Susceptibility   Pseudomonas aeruginosa - MIC*    CEFTAZIDIME 4 SENSITIVE Sensitive     CIPROFLOXACIN >=4 RESISTANT Resistant     GENTAMICIN RESISTANT Resistant     IMIPENEM 1 SENSITIVE Sensitive     PIP/TAZO 16 SENSITIVE Sensitive     CEFEPIME 8 SENSITIVE Sensitive     * >=100,000 COLONIES/mL PSEUDOMONAS AERUGINOSA  Respiratory Panel by PCR     Status: None   Collection Time: 10/06/19 12:35 PM   Specimen: Nasopharyngeal Swab; Respiratory  Result Value Ref Range Status   Adenovirus NOT DETECTED NOT DETECTED Final   Coronavirus 229E NOT DETECTED NOT DETECTED Final    Comment: (NOTE) The Coronavirus on the Respiratory Panel, DOES NOT test for the novel  Coronavirus (2019 nCoV)    Coronavirus HKU1 NOT DETECTED  NOT DETECTED Final   Coronavirus NL63 NOT DETECTED NOT DETECTED Final   Coronavirus OC43 NOT DETECTED NOT DETECTED Final   Metapneumovirus NOT DETECTED NOT DETECTED Final   Rhinovirus / Enterovirus  NOT DETECTED NOT DETECTED Final   Influenza A NOT DETECTED NOT DETECTED Final   Influenza B NOT DETECTED NOT DETECTED Final   Parainfluenza Virus 1 NOT DETECTED NOT DETECTED Final   Parainfluenza Virus 2 NOT DETECTED NOT DETECTED Final   Parainfluenza Virus 3 NOT DETECTED NOT DETECTED Final   Parainfluenza Virus 4 NOT DETECTED NOT DETECTED Final   Respiratory Syncytial Virus NOT DETECTED NOT DETECTED Final   Bordetella pertussis NOT DETECTED NOT DETECTED Final   Chlamydophila pneumoniae NOT DETECTED NOT DETECTED Final   Mycoplasma pneumoniae NOT DETECTED NOT DETECTED Final    Comment: Performed at [facility], [place] [address], [place], [place] [address]     Radiology Studies: No results found.  Scheduled Meds: . acetaZOLAMIDE  250 mg Oral BID  . aspirin EC  81 mg Oral Daily  . atorvastatin  20 mg Oral QPM  . Chlorhexidine Gluconate Cloth  6 each Topical Daily  . enoxaparin (LOVENOX) injection  70 mg Subcutaneous Q24H  . escitalopram  20 mg Oral Daily  . fesoterodine  8 mg Oral QHS  . gabapentin  200 mg Oral TID  . hydrALAZINE  37.5 mg Oral TID  . insulin aspart  0-20 Units Subcutaneous TID WC  . insulin aspart  0-5 Units Subcutaneous QHS  . insulin aspart  15 Units Subcutaneous TID WC  . insulin glargine  65 Units Subcutaneous BID  . ipratropium-albuterol  3 mL Nebulization Q4H  . levothyroxine  175 mcg Oral Q0600  . methylPREDNISolone (SOLU-MEDROL) injection  40 mg Intravenous Q12H  . multivitamin with minerals  1 tablet Oral Daily  . polyethylene glycol  17 g Oral QHS  . potassium chloride SA  20 mEq Oral Daily  . pramipexole  0.5 mg Oral Daily  . pramipexole  1 mg Oral QHS  . spironolactone  25 mg Oral Daily  . tamsulosin  0.4 mg Oral QHS   Continuous Infusions: . sodium chloride 10 mL/hr at 10/06/19 1218  . ceFEPime (MAXIPIME) IV 1 g (10/08/19 1100)  . doxycycline (VIBRAMYCIN) IV 100 mg (10/08/19 0911)  . furosemide 120 mg (10/08/19 1136)     LOS: 4 days    Marylu Lund, MD [facility] Pager On Amion  If 7PM-7AM, please contact night-coverage 10/08/2019, 3:18 PM

## 2019-10-08 NOTE — Progress Notes (Addendum)
Advanced Heart Failure Rounding Note  PCP-Cardiologist: Mertie Moores, MD   Subjective:    Reportedly was more lethargic this am. ABG well compensated but showed pCO2 in 60s which appears chronic for him.   Now on Bipap. More awake. Continues to diurese with clear urine but BUN on the rise.   Says breathing is better on BIPAP. Denies CP, orthopnea or PND.   Objective:   Weight Range: 134.2 kg Body mass index is 39.03 kg/m.   Vital Signs:   Temp:  [97.7 F (36.5 C)-98.9 F (37.2 C)] 97.7 F (36.5 C) (10/10 1316) Pulse Rate:  [89-96] 96 (10/09 2148) Resp:  [16-25] 21 (10/10 1318) BP: (110-155)/(39-81) 110/78 (10/10 1316) SpO2:  [85 %-97 %] 95 % (10/10 1316) FiO2 (%):  [50 %-60 %] 50 % (10/10 0403) Weight:  [134.2 kg] 134.2 kg (10/10 0410) Last BM Date: 10/07/19  Weight change: Filed Weights   10/06/19 0428 10/07/19 0455 10/08/19 0410  Weight: (!) 137.5 kg 135.2 kg 134.2 kg    Intake/Output:   Intake/Output Summary (Last 24 hours) at 10/08/2019 1322 Last data filed at 10/08/2019 1316 Gross per 24 hour  Intake 500 ml  Output 4175 ml  Net -3675 ml      Physical Exam     General: Chronically ill morbidly obese male on BIPAP  HEENT: normal + BIPAP Neck: supple. JVP hard to see Carotids 2+ bilat; no bruits. No lymphadenopathy or thryomegaly appreciated. Cor: PMI nondisplaced. Regular rate & rhythm. No rubs, gallops or murmurs. Lungs: coarse Abdomen: markedly obese soft, nontender, nondistended. No hepatosplenomegaly. No bruits or masses. Good bowel sounds. Extremities: no cyanosis, clubbing, rash, 2-3+ edema Neuro: alert & orientedx3, cranial nerves grossly intact. moves all 4 extremities w/o difficulty. Affect pleasant  Telemetry   NSR 80-90s. Personally reviewed  Labs    CBC Recent Labs    10/07/19 0346 10/08/19 0414  WBC 8.6 7.6  HGB 12.0* 11.9*  HCT 36.6* 37.6*  MCV 89.5 90.8  PLT 168 [id number]   Basic Metabolic Panel Recent Labs    10/07/19  0346 10/08/19 0414 10/08/19 1200  NA 134* 134* 136  K 3.8 4.0 3.9  CL 83* 82* 85*  CO2 37* 33* 37*  GLUCOSE 362* 391* 353*  BUN 101* 114* 114*  CREATININE 2.50* 2.61* 2.45*  CALCIUM 9.2 9.4 9.3  MG 2.4 2.4  --    Liver Function Tests No results for input(s): AST, ALT, ALKPHOS, BILITOT, PROT, ALBUMIN in the last 72 hours. No results for input(s): LIPASE, AMYLASE in the last 72 hours. Cardiac Enzymes No results for input(s): CKTOTAL, CKMB, CKMBINDEX, TROPONINI in the last 72 hours.  BNP: BNP (last 3 results) Recent Labs    08/03/19 1040 09/15/19 2138 10/03/19 2250  BNP 70.1 28.6 51.6    ProBNP (last 3 results) No results for input(s): PROBNP in the last 8760 hours.   D-Dimer No results for input(s): DDIMER in the last 72 hours. Hemoglobin A1C No results for input(s): HGBA1C in the last 72 hours. Fasting Lipid Panel No results for input(s): CHOL, HDL, LDLCALC, TRIG, CHOLHDL, LDLDIRECT in the last 72 hours. Thyroid Function Tests No results for input(s): TSH, T4TOTAL, T3FREE, THYROIDAB in the last 72 hours.  Invalid input(s): FREET3  Other results:   Imaging    No results found.   Medications:     Scheduled Medications: . acetaZOLAMIDE  250 mg Oral BID  . aspirin EC  81 mg Oral Daily  . atorvastatin  20  mg Oral QPM  . Chlorhexidine Gluconate Cloth  6 each Topical Daily  . enoxaparin (LOVENOX) injection  70 mg Subcutaneous Q24H  . escitalopram  20 mg Oral Daily  . fesoterodine  8 mg Oral QHS  . gabapentin  200 mg Oral TID  . hydrALAZINE  37.5 mg Oral TID  . insulin aspart  0-20 Units Subcutaneous TID WC  . insulin aspart  0-5 Units Subcutaneous QHS  . insulin aspart  15 Units Subcutaneous TID WC  . insulin glargine  65 Units Subcutaneous BID  . ipratropium-albuterol  3 mL Nebulization Q4H  . levothyroxine  175 mcg Oral Q0600  . methylPREDNISolone (SOLU-MEDROL) injection  40 mg Intravenous Q12H  . metolazone  2.5 mg Oral Daily  . multivitamin with  minerals  1 tablet Oral Daily  . polyethylene glycol  17 g Oral QHS  . potassium chloride SA  20 mEq Oral Daily  . pramipexole  0.5 mg Oral Daily  . pramipexole  1 mg Oral QHS  . spironolactone  25 mg Oral Daily  . tamsulosin  0.4 mg Oral QHS    Infusions: . sodium chloride 10 mL/hr at 10/06/19 1218  . ceFEPime (MAXIPIME) IV 1 g (10/08/19 1100)  . doxycycline (VIBRAMYCIN) IV 100 mg (10/08/19 0911)  . furosemide 120 mg (10/08/19 1136)    PRN Medications: sodium chloride, acetaminophen **OR** acetaminophen, albuterol, ipratropium-albuterol, ondansetron **OR** ondansetron (ZOFRAN) IV    Assessment/Plan   1. 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 Full aggressive care 2. Full care but DNR/DNI 3. Switch to comfort care - They will discuss further. Given severe lung disease in setting of CKD 4 and diastolic HF I estimated he had at least 50% mortality over next 6-12 months.  - May be reasonable to have Palliative Care some see him  2. A/C Diastolic Heart Failure -Most recent ECHO LVEF 60-65% with normal RV.  -Has diuresed 11 pounds -Still with significant lower extremity edmea.  - However BUNbeginning to rise -Will continue IV lasix 120 mg twice a day & acetazolamide - Hold metoalzone + TED hose   3. AKI on CKD Stage IV -Creatinine baseline 2-2.2 -Today 2.4. BUN rising - Manage diuretics as above  4. LLE Erythema, Possible cellulitis.  On antibiotics.  WBC is not elevated.   4. CAD . Non-obstructive. LHC 08/2015: LM 40, LAD distal 40, LCx ostial 50, RCA proximal 40 - No chest/pain - On ASA/statin  -Offb-blocker   5. DMII, poorly controleld Management per IM. On insulin   6. UTI - recurrent pseudomonas UTI - on IV abx per primary team   Length of Stay: 4  Glori Bickers, MD  10/08/2019, 1:22 PM  Advanced Heart Failure Team Pager [phone] (M-F; 7a - 4p)  Please contact [facility] for night-coverage after hours (4p -7a ) and weekends on [email or web address]

## 2019-10-08 NOTE — Significant Event (Signed)
Rapid Response Event Note  Overview: Follow Up - Respiratory   I came by to see Mr. Cohee around 1745, he looked much better. Breathing comfortably on HHFNC 50% 25L, saturations 93-95%, RR 16-20, he is able to talk in full sentences. Awake, alert, and conversant.   Plan: -- Okay for patient to eat dinner -- BIPAP overnight - patient aware.   Start Time 1745  End Time 1800  Chaise Mahabir R

## 2019-10-08 NOTE — Significant Event (Signed)
Rapid Response Event Note  Overview: Respiratory   Initial Focused Assessment: I was rounding on 6E when nursing staff approached me about Mr. Bulkley current condition. Per nurse, Mr. Eberling has been sleepy this morning, currently his breathing is labored, mild accessory muscle use. ABG was done at 1050 - 7.4/62.8/69/38. Patient was on HHFNC 50% 25L, RR 22-24, 93% currently. Patient was sleepy, woke to my voice, he denied SOB and stated he felt his breathing was okay, but stated he felt quite tired. Also nurse informed me that blood sugars have been > 400, this has been ongoing - on IV Steroids.   Interventions: -- BIPAP -- improved RR and WOB after BIPAP was initiated  Plan of Care: -- Leave on BIPAP for 1-2 hours  -- Monitor VS and respiratory status  -- Monitor mental status, should improve after a few hours once CO2 improves.  -- MD will review noon BMP -- depending on that -- might need IV Insulin. AG 19 this morning.   Event Summary:  Start Time 1100 End Time 1125  Aryannah Mohon R

## 2019-10-09 LAB — CBC
HCT: 37.1 % — ABNORMAL LOW (ref 39.0–52.0)
Hemoglobin: 12 g/dL — ABNORMAL LOW (ref 13.0–17.0)
MCH: 29.1 pg (ref 26.0–34.0)
MCHC: 32.3 g/dL (ref 30.0–36.0)
MCV: 90 fL (ref 80.0–100.0)
Platelets: 145 10*3/uL — ABNORMAL LOW (ref 150–400)
RBC: 4.12 MIL/uL — ABNORMAL LOW (ref 4.22–5.81)
RDW: 16.2 % — ABNORMAL HIGH (ref 11.5–15.5)
WBC: 6.2 10*3/uL (ref 4.0–10.5)
nRBC: 0 % (ref 0.0–0.2)

## 2019-10-09 LAB — BASIC METABOLIC PANEL
Anion gap: 16 — ABNORMAL HIGH (ref 5–15)
BUN: 120 mg/dL — ABNORMAL HIGH (ref 8–23)
CO2: 34 mmol/L — ABNORMAL HIGH (ref 22–32)
Calcium: 9.5 mg/dL (ref 8.9–10.3)
Chloride: 86 mmol/L — ABNORMAL LOW (ref 98–111)
Creatinine, Ser: 2.11 mg/dL — ABNORMAL HIGH (ref 0.61–1.24)
GFR calc Af Amer: 35 mL/min — ABNORMAL LOW (ref >60)
GFR calc non Af Amer: 30 mL/min — ABNORMAL LOW (ref >60)
Glucose, Bld: 273 mg/dL — ABNORMAL HIGH (ref 70–99)
Potassium: 3.9 mmol/L (ref 3.5–5.1)
Sodium: 136 mmol/L (ref 135–145)

## 2019-10-09 LAB — CULTURE, BLOOD (ROUTINE X 2)
Culture: NO GROWTH
Culture: NO GROWTH
Special Requests: ADEQUATE
Special Requests: ADEQUATE

## 2019-10-09 LAB — MAGNESIUM: Magnesium: 2.5 mg/dL — ABNORMAL HIGH (ref 1.7–2.4)

## 2019-10-09 LAB — GLUCOSE, CAPILLARY
Glucose-Capillary: 282 mg/dL — ABNORMAL HIGH (ref 70–99)
Glucose-Capillary: 357 mg/dL — ABNORMAL HIGH (ref 70–99)
Glucose-Capillary: 258 mg/dL — ABNORMAL HIGH (ref 70–99)
Glucose-Capillary: 176 mg/dL — ABNORMAL HIGH (ref 70–99)

## 2019-10-09 MED ORDER — INSULIN GLARGINE 100 UNIT/ML ~~LOC~~ SOLN
75.0000 [IU] | Freq: Two times a day (BID) | SUBCUTANEOUS | Status: DC
  Administered 2019-10-09: 10 [IU] via SUBCUTANEOUS
  Administered 2019-10-09: 75 [IU] via SUBCUTANEOUS
  Administered 2019-10-09: 65 [IU] via SUBCUTANEOUS
  Administered 2019-10-10 (×2): 75 [IU] via SUBCUTANEOUS
  Filled 2019-10-09 (×6): qty 0.75

## 2019-10-09 NOTE — Progress Notes (Signed)
Advanced Heart Failure Rounding Note  PCP-Cardiologist: Mertie Moores, MD   Subjective:    Was on BIPAP yesterday for lethargy related to CO2 retention. More alert today. Off BiPAP  Remains on IV lasix and acetazolamide. Metolazone held due to rising BUN. Weight down another 4 pounds. (15 pounds total)  Objective:   Weight Range: 132.3 kg Body mass index is 38.48 kg/m.   Vital Signs:   Temp:  [97.7 F (36.5 C)-97.8 F (36.6 C)] 97.8 F (36.6 C) (10/10 2139) Pulse Rate:  [78-86] 82 (10/11 0706) Resp:  [19-29] 20 (10/11 0706) BP: (104-115)/(51-78) 104/51 (10/11 0706) SpO2:  [92 %-96 %] 93 % (10/11 1112) FiO2 (%):  [40 %-50 %] 50 % (10/11 1112) Weight:  [132.3 kg] 132.3 kg (10/11 0706) Last BM Date: 10/07/19  Weight change: Filed Weights   10/07/19 0455 10/08/19 0410 10/09/19 0706  Weight: 135.2 kg 134.2 kg 132.3 kg    Intake/Output:   Intake/Output Summary (Last 24 hours) at 10/09/2019 1128 Last data filed at 10/09/2019 0636 Gross per 24 hour  Intake 240 ml  Output 4000 ml  Net -3760 ml      Physical Exam     General: Chronically ill morbidly obese male Sitting in chair  HEENT: normal Neck: supple. JVP hard to see. Carotids 2+ bilat; no bruits. No lymphadenopathy or thryomegaly appreciated. Cor: PMI nondisplaced. Regular rate & rhythm. No rubs, gallops or murmurs. Lungs: decreased BS throughout. No wheeze.  Abdomen: markedly obese soft, nontender, + distended. No hepatosplenomegaly. No bruits or masses. Good bowel sounds. Extremities: no cyanosis, clubbing, rash,2+ edema Neuro: alert & orientedx3, cranial nerves grossly intact. moves all 4 extremities w/o difficulty. Affect pleasant + asterixis   Telemetry   NSR 80-90s. Personally reviewed  Labs    CBC Recent Labs    10/08/19 0414 10/09/19 0501  WBC 7.6 6.2  HGB 11.9* 12.0*  HCT 37.6* 37.1*  MCV 90.8 90.0  PLT 166 Q000111Q*   Basic Metabolic Panel Recent Labs    10/08/19 0414 10/08/19  1200 10/09/19 0501  NA 134* 136 136  K 4.0 3.9 3.9  CL 82* 85* 86*  CO2 33* 37* 34*  GLUCOSE 391* 353* 273*  BUN 114* 114* 120*  CREATININE 2.61* 2.45* 2.11*  CALCIUM 9.4 9.3 9.5  MG 2.4  --  2.5*   Liver Function Tests No results for input(s): AST, ALT, ALKPHOS, BILITOT, PROT, ALBUMIN in the last 72 hours. No results for input(s): LIPASE, AMYLASE in the last 72 hours. Cardiac Enzymes No results for input(s): CKTOTAL, CKMB, CKMBINDEX, TROPONINI in the last 72 hours.  BNP: BNP (last 3 results) Recent Labs    08/03/19 1040 09/15/19 2138 10/03/19 2250  BNP 70.1 28.6 51.6    ProBNP (last 3 results) No results for input(s): PROBNP in the last 8760 hours.   D-Dimer No results for input(s): DDIMER in the last 72 hours. Hemoglobin A1C No results for input(s): HGBA1C in the last 72 hours. Fasting Lipid Panel No results for input(s): CHOL, HDL, LDLCALC, TRIG, CHOLHDL, LDLDIRECT in the last 72 hours. Thyroid Function Tests No results for input(s): TSH, T4TOTAL, T3FREE, THYROIDAB in the last 72 hours.  Invalid input(s): FREET3  Other results:   Imaging    No results found.   Medications:     Scheduled Medications: . acetaZOLAMIDE  250 mg Oral BID  . aspirin EC  81 mg Oral Daily  . atorvastatin  20 mg Oral QPM  . Chlorhexidine Gluconate Cloth  6 each Topical Daily  . enoxaparin (LOVENOX) injection  70 mg Subcutaneous Q24H  . escitalopram  20 mg Oral Daily  . fesoterodine  8 mg Oral QHS  . gabapentin  200 mg Oral TID  . hydrALAZINE  37.5 mg Oral TID  . insulin aspart  0-20 Units Subcutaneous TID WC  . insulin aspart  0-5 Units Subcutaneous QHS  . insulin aspart  15 Units Subcutaneous TID WC  . insulin glargine  75 Units Subcutaneous BID  . ipratropium-albuterol  3 mL Nebulization Q4H  . levothyroxine  175 mcg Oral Q0600  . methylPREDNISolone (SOLU-MEDROL) injection  40 mg Intravenous Q12H  . multivitamin with minerals  1 tablet Oral Daily  . polyethylene  glycol  17 g Oral QHS  . potassium chloride SA  20 mEq Oral Daily  . pramipexole  0.5 mg Oral Daily  . pramipexole  1 mg Oral QHS  . spironolactone  25 mg Oral Daily  . tamsulosin  0.4 mg Oral QHS    Infusions: . sodium chloride 250 mL (10/09/19 0846)  . ceFEPime (MAXIPIME) IV 1 g (10/09/19 0850)  . doxycycline (VIBRAMYCIN) IV 100 mg (10/09/19 1101)  . furosemide 120 mg (10/09/19 0254)    PRN Medications: sodium chloride, acetaminophen **OR** acetaminophen, albuterol, ipratropium-albuterol, ondansetron **OR** ondansetron (ZOFRAN) IV    Assessment/Plan   1. 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 Full aggressive care 2. Full care but DNR/DNI 3. Switch to comfort care - Given severe lung disease in setting of CKD 4 and diastolic HF I estimated he had at least 50% mortality over next 6-12 months. I do not see any way he can safely return home and he has been reluctant to consider SNF.  - I discussed with them again today and his wife expressed concern over her ability to care for him adequately at home. They are open to Palliative Care consultation and would like to include their daughter as well. I have placed consult.   2. A/C Diastolic Heart Failure -Most recent ECHO LVEF 60-65% with normal RV.  -Has diuresed 15 pounds -Still with significant lower extremity edmea.  - Creatinine improved. However BUN continues to rise (now 120) -Will continue IV lasix 120 mg twice a day & acetazolamide - Continue TED hose   3. AKI on CKD Stage IV -Creatinine baseline 2-2.2 -Today 2.1. BUN rising (baseline BUN 90-100. It is 120 today) - Manage diuretics as above  4. LLE Erythema, Possible cellulitis.  On antibiotics.  WBC is not elevated.    4. CAD . Non-obstructive. LHC 08/2015: LM 40, LAD distal 40, LCx ostial 50, RCA proximal 40 - No chest/pain - On ASA/statin  -Offb-blocker   5. DMII, poorly controleld Management per IM. On insulin   6. UTI - recurrent pseudomonas UTI - on IV abx per primary team  7. Lethargy - likely component of CO2 retention and uremia   Length of Stay: 5  Glori Bickers, MD  10/09/2019, 11:28 AM  Advanced Heart Failure Team Pager [phone] (M-F; 7a - 4p)  Please contact [facility] for night-coverage after hours (4p -7a ) and weekends on [email or web address]

## 2019-10-09 NOTE — Progress Notes (Signed)
PROGRESS NOTE    Travis [name]  [id number] DOB: [birth year] DOA: 10/03/2019 PCP: [name], MD    Brief Narrative:  73 y.o. male past medical history of chronic diastolic heart failure, diabetes mellitus type 2, COPD essential hypertension obesity hypoventilation syndrome BiPAP, chronic kidney disease with a baseline creatinine around 2 with a recent admission for heart failure,comes in for shortness of breath in the ED he was found to be hypoxic requiring a nonrebreather chest x-ray showed bilateral atelectasis COVID-19 PCR was negative, on exam he has JVD with lower extremity edema.  Assessment & Plan:   Principal Problem:   Acute respiratory failure with hypoxia (HCC) Active Problems:   Tetraparesis (HCC)   Obesity, Class III, BMI 40-49.9 (morbid obesity) (HCC)   Type 2 diabetes mellitus with stage 3 chronic kidney disease, with long-term current use of insulin (HCC)   Acute CHF (congestive heart failure) (HCC)   Pressure injury of sacral region, stage 2 (HCC)   Obesity hypoventilation syndrome (HCC)   Acute respiratory failure with hypoxia due to chronic diastolic heart failure and possibly due to acute COPD exacerbation.: -Cardiology following. Recommendation to diurese, with addition of acetazolamide -Continued on IV Rocephin and IV doxycycline for possible community-acquired pneumonia -Pt is continued on IV steroids inhalers and continue on ongoing antibiotics for possible COPD exacerbation. -On 10/8, pt noted to have increased work of breathing and increased O2 requirements, requiring bipap. Pt continues to have good diuresis -Pt noted to refuse BiPAP and instead wanting to continue on high flow O2 when sleeping. Pt reminded to use BiPAP when sleeping -Mentation seems improved -Continuing to diurese well with over 4L output over past 24hrs  Acute kidney injury on chronic kidney disease stage III with baseline creatinine around 2. -Continues with good urine  output, tolerating diuretics -Cr is slowly improving, Cr now 2.11 -Recheck bmet in AM  Diabetes mellitus type 2 insulin-dependent: -Glucose poorly controlled with concurrent IV steroids, albeit now improving -Continue SSI at resistant scale -Have further increased lantus to 75 units bid with 15 units meal coverage -Glucose now in the 300's  Hyperlipidemia: -continued on statin -Seems to be stable currently  Depression: -Continue Lexapro as pt tolerates  Hypothyroidism: -Pt is continued on Synthroid 161mcg daily -Seems stable  Essential hypertension: -Continue IV Lasix with acetazolamide -Obesity hypoventilation syndrome on BiPAP at home, has not been wearing CPAP or BiPAP consistently on floor -BP stable at present  Pressure ulcer stage II present on admission. -Remains stable at this time  Pseudomonas bacteriuria -UA not suggestive of UTI with no WBCs noted -Urine cx is pos for pseudomonas resistant to cipro and gen -Pt already on cefepime for above concerns of PNA -Does not complain of dysuria symptoms  DVT prophylaxis: Lovenox subq Code Status: Full Family Communication: Pt in room, family at bedside Disposition Plan: Uncertain at this time  Consultants:   Cardiology  Procedures:     Antimicrobials: Anti-infectives (From admission, onward)   Start     Dose/Rate Route Frequency Ordered Stop   10/06/19 0900  ceFEPIme (MAXIPIME) 1 g in sodium chloride 0.9 % 100 mL IVPB     1 g 200 mL/hr over 30 Minutes Intravenous Daily 10/06/19 0755     10/05/19 1645  cefTRIAXone (ROCEPHIN) 1 g in sodium chloride 0.9 % 100 mL IVPB  Status:  Discontinued     1 g 200 mL/hr over 30 Minutes Intravenous Every 24 hours 10/05/19 1631 10/05/19 1633   10/05/19 1000  doxycycline (VIBRAMYCIN) 100  mg in sodium chloride 0.9 % 250 mL IVPB     100 mg 125 mL/hr over 120 Minutes Intravenous Every 12 hours 10/05/19 0927 10/11/19 0959   10/05/19 0930  doxycycline (VIBRAMYCIN) 100 mg in  sodium chloride 0.9 % 250 mL IVPB  Status:  Discontinued     100 mg 125 mL/hr over 120 Minutes Intravenous Every 12 hours 10/05/19 0920 10/05/19 0927   10/04/19 1200  ceFEPIme (MAXIPIME) 2 g in sodium chloride 0.9 % 100 mL IVPB  Status:  Discontinued     2 g 200 mL/hr over 30 Minutes Intravenous Every 12 hours 10/04/19 1127 10/05/19 0920   10/03/19 2300  cefTRIAXone (ROCEPHIN) 2 g in sodium chloride 0.9 % 100 mL IVPB  Status:  Discontinued     2 g 200 mL/hr over 30 Minutes Intravenous Every 24 hours 10/03/19 2255 10/04/19 1057   10/03/19 2300  azithromycin (ZITHROMAX) 500 mg in sodium chloride 0.9 % 250 mL IVPB  Status:  Discontinued     500 mg 250 mL/hr over 60 Minutes Intravenous Every 24 hours 10/03/19 2255 10/05/19 0919      Subjective: Reports breathing better. Seems more alert this AM  Objective: Vitals:   10/09/19 1112 10/09/19 1320 10/09/19 1513 10/09/19 1515  BP:      Pulse:    76  Resp:  (!) 22  (!) 23  Temp:      TempSrc:      SpO2: 93% 93% 92% 91%  Weight:      Height:        Intake/Output Summary (Last 24 hours) at 10/09/2019 1607 Last data filed at 10/09/2019 0820 Gross per 24 hour  Intake 828.37 ml  Output 2700 ml  Net -1871.63 ml   Filed Weights   10/07/19 0455 10/08/19 0410 10/09/19 0706  Weight: 135.2 kg 134.2 kg 132.3 kg    Examination: General exam: Conversant, in no acute distress Respiratory system: normal chest rise, clear, no audible wheezing Cardiovascular system: regular rhythm, s1-s2 Gastrointestinal system: Nondistended, nontender, pos BS Central nervous system: No seizures, no tremors Extremities: No cyanosis, no joint deformities Skin: No rashes, no pallor Psychiatry: Affect normal // no auditory hallucinations   Data Reviewed: I have personally reviewed following labs and imaging studies  CBC: Recent Labs  Lab 10/03/19 2250 10/04/19 0825 10/04/19 1125 10/06/19 0252 10/07/19 0346 10/08/19 0414 10/09/19 0501  WBC 9.3 8.5   --  7.4 8.6 7.6 6.2  NEUTROABS 8.0* 7.0  --   --   --   --   --   HGB 14.1 12.7* 12.9* 12.1* 12.0* 11.9* 12.0*  HCT 42.8 40.1 38.0* 38.0* 36.6* 37.6* 37.1*  MCV 91.1 93.0  --  91.3 89.5 90.8 90.0  PLT 218 199  --  179 168 166 Q000111Q*   Basic Metabolic Panel: Recent Labs  Lab 10/05/19 0926 10/06/19 0252 10/07/19 0346 10/08/19 0414 10/08/19 1200 10/09/19 0501  NA 137 135 134* 134* 136 136  K 4.3 4.3 3.8 4.0 3.9 3.9  CL 85* 88* 83* 82* 85* 86*  CO2 37* 35* 37* 33* 37* 34*  GLUCOSE 318* 378* 362* 391* 353* 273*  BUN 89* 92* 101* 114* 114* 120*  CREATININE 2.39* 2.56* 2.50* 2.61* 2.45* 2.11*  CALCIUM 9.1 8.9 9.2 9.4 9.3 9.5  MG 2.5* 2.4 2.4 2.4  --  2.5*   GFR: Estimated Creatinine Clearance: 43.8 mL/min (A) (by C-G formula based on SCr of 2.11 mg/dL (H)). Liver Function Tests: Recent Labs  Lab  10/03/19 2250 10/04/19 0825  AST 19 18  ALT 26 25  ALKPHOS 108 89  BILITOT 0.3 0.6  PROT 7.5 6.5  ALBUMIN 3.5 3.1*   No results for input(s): LIPASE, AMYLASE in the last 168 hours. No results for input(s): AMMONIA in the last 168 hours. Coagulation Profile: Recent Labs  Lab 10/03/19 2250  INR 1.0   Cardiac Enzymes: No results for input(s): CKTOTAL, CKMB, CKMBINDEX, TROPONINI in the last 168 hours. BNP (last 3 results) No results for input(s): PROBNP in the last 8760 hours. HbA1C: No results for input(s): HGBA1C in the last 72 hours. CBG: Recent Labs  Lab 10/08/19 1029 10/08/19 1539 10/08/19 2136 10/09/19 0746 10/09/19 1117  GLUCAP 402* 203* 277* 282* 357*   Lipid Profile: No results for input(s): CHOL, HDL, LDLCALC, TRIG, CHOLHDL, LDLDIRECT in the last 72 hours. Thyroid Function Tests: No results for input(s): TSH, T4TOTAL, FREET4, T3FREE, THYROIDAB in the last 72 hours. Anemia Panel: No results for input(s): VITAMINB12, FOLATE, FERRITIN, TIBC, IRON, RETICCTPCT in the last 72 hours. Sepsis Labs: Recent Labs  Lab 10/03/19 2325 10/05/19 0926 10/06/19 0252  10/07/19 0346  PROCALCITON  --  <0.10 <0.10 <0.10  LATICACIDVEN 1.6  --   --   --     Recent Results (from the past 240 hour(s))  SARS Coronavirus 2 [facility] order, Performed in [facility] hospital lab) Nasopharyngeal Nasopharyngeal Swab     Status: None   Collection Time: 10/03/19 11:27 PM   Specimen: Nasopharyngeal Swab  Result Value Ref Range Status   SARS Coronavirus 2 NEGATIVE NEGATIVE Final    Comment: (NOTE) If result is NEGATIVE SARS-CoV-2 target nucleic acids are NOT DETECTED. The SARS-CoV-2 RNA is generally detectable in upper and lower  respiratory specimens during the acute phase of infection. The lowest  concentration of SARS-CoV-2 viral copies this assay can detect is 250  copies / mL. A negative result does not preclude SARS-CoV-2 infection  and should not be used as the sole basis for treatment or other  patient management decisions.  A negative result may occur with  improper specimen collection / handling, submission of specimen other  than nasopharyngeal swab, presence of viral mutation(s) within the  areas targeted by this assay, and inadequate number of viral copies  (<250 copies / mL). A negative result must be combined with clinical  observations, patient history, and epidemiological information. If result is POSITIVE SARS-CoV-2 target nucleic acids are DETECTED. The SARS-CoV-2 RNA is generally detectable in upper and lower  respiratory specimens dur ing the acute phase of infection.  Positive  results are indicative of active infection with SARS-CoV-2.  Clinical  correlation with patient history and other diagnostic information is  necessary to determine patient infection status.  Positive results do  not rule out bacterial infection or co-infection with other viruses. If result is PRESUMPTIVE POSTIVE SARS-CoV-2 nucleic acids MAY BE PRESENT.   A presumptive positive result was obtained on the submitted specimen  and confirmed on repeat testing.  While  2019 novel coronavirus  (SARS-CoV-2) nucleic acids may be present in the submitted sample  additional confirmatory testing may be necessary for epidemiological  and / or clinical management purposes  to differentiate between  SARS-CoV-2 and other Sarbecovirus currently known to infect humans.  If clinically indicated additional testing with an alternate test  methodology [phone]) is advised. The SARS-CoV-2 RNA is generally  detectable in upper and lower respiratory sp ecimens during the acute  phase of infection. The expected result is  Negative. Fact Sheet for Patients:  StrictlyIdeas.no Fact Sheet for Healthcare Providers: BankingDealers.co.za This test is not yet approved or cleared by the [place] FDA and has been authorized for detection and/or diagnosis of SARS-CoV-2 by FDA under an Emergency Use Authorization (EUA).  This EUA will remain in effect (meaning this test can be used) for the duration of the COVID-19 declaration under Section 564(b)(1) of the Act, 21 U.S.C. section 360bbb-3(b)(1), unless the authorization is terminated or revoked sooner. Performed at [facility], [place] [address] [place], [place] [address]   Blood Culture (routine x 2)     Status: None   Collection Time: 10/03/19 11:30 PM   Specimen: BLOOD  Result Value Ref Range Status   Specimen Description BLOOD  Final   Special Requests   Final    BOTTLES DRAWN AEROBIC AND ANAEROBIC Blood Culture adequate volume   Culture   Final    NO GROWTH 5 DAYS Performed at [facility], [place] [address], [place], [place] [address]    Report Status 10/09/2019 FINAL  Final  Blood Culture (routine x 2)     Status: None   Collection Time: 10/03/19 11:35 PM   Specimen: BLOOD LEFT ARM  Result Value Ref Range Status   Specimen Description BLOOD LEFT ARM  Final   Special Requests   Final    BOTTLES DRAWN AEROBIC AND ANAEROBIC Blood Culture adequate volume    Culture   Final    NO GROWTH 5 DAYS Performed at [facility], [place] [address] [place], [place] [address]    Report Status 10/09/2019 FINAL  Final  Urine culture     Status: Abnormal   Collection Time: 10/04/19 12:18 AM   Specimen: In/Out Cath Urine  Result Value Ref Range Status   Specimen Description IN/OUT CATH URINE  Final   Special Requests   Final    NONE Performed at [facility], [place] [address] [place],  [address]    Culture >=100,000 COLONIES/mL PSEUDOMONAS AERUGINOSA (A)  Final   Report Status 10/05/2019 FINAL  Final   Organism ID, Bacteria PSEUDOMONAS AERUGINOSA (A)  Final      Susceptibility   Pseudomonas aeruginosa - MIC*    CEFTAZIDIME 4 SENSITIVE Sensitive     CIPROFLOXACIN >=4 RESISTANT Resistant     GENTAMICIN RESISTANT Resistant     IMIPENEM 1 SENSITIVE Sensitive     PIP/TAZO 16 SENSITIVE Sensitive     CEFEPIME 8 SENSITIVE Sensitive     * >=100,000 COLONIES/mL PSEUDOMONAS AERUGINOSA  Respiratory Panel by PCR     Status: None   Collection Time: 10/06/19 12:35 PM   Specimen: Nasopharyngeal Swab; Respiratory  Result Value Ref Range Status   Adenovirus NOT DETECTED NOT DETECTED Final   Coronavirus 229E NOT DETECTED NOT DETECTED Final    Comment: (NOTE) The Coronavirus on the Respiratory Panel, DOES NOT test for the novel  Coronavirus (2019 nCoV)    Coronavirus HKU1 NOT DETECTED NOT DETECTED Final   Coronavirus NL63 NOT DETECTED NOT DETECTED Final   Coronavirus OC43 NOT DETECTED NOT DETECTED Final   Metapneumovirus NOT DETECTED NOT DETECTED Final   Rhinovirus / Enterovirus NOT DETECTED NOT DETECTED Final   Influenza A NOT DETECTED NOT DETECTED Final   Influenza B NOT DETECTED NOT DETECTED Final   Parainfluenza Virus 1 NOT DETECTED NOT DETECTED Final   Parainfluenza Virus 2 NOT DETECTED NOT DETECTED Final   Parainfluenza Virus 3 NOT DETECTED NOT DETECTED Final  Parainfluenza Virus 4 NOT DETECTED NOT DETECTED Final   Respiratory Syncytial  Virus NOT DETECTED NOT DETECTED Final   Bordetella pertussis NOT DETECTED NOT DETECTED Final   Chlamydophila pneumoniae NOT DETECTED NOT DETECTED Final   Mycoplasma pneumoniae NOT DETECTED NOT DETECTED Final    Comment: Performed at [facility], [place] [address], [place], [place] [address]     Radiology Studies: No results found.  Scheduled Meds: . acetaZOLAMIDE  250 mg Oral BID  . aspirin EC  81 mg Oral Daily  . atorvastatin  20 mg Oral QPM  . Chlorhexidine Gluconate Cloth  6 each Topical Daily  . enoxaparin (LOVENOX) injection  70 mg Subcutaneous Q24H  . escitalopram  20 mg Oral Daily  . fesoterodine  8 mg Oral QHS  . gabapentin  200 mg Oral TID  . hydrALAZINE  37.5 mg Oral TID  . insulin aspart  0-20 Units Subcutaneous TID WC  . insulin aspart  0-5 Units Subcutaneous QHS  . insulin aspart  15 Units Subcutaneous TID WC  . insulin glargine  75 Units Subcutaneous BID  . ipratropium-albuterol  3 mL Nebulization Q4H  . levothyroxine  175 mcg Oral Q0600  . methylPREDNISolone (SOLU-MEDROL) injection  40 mg Intravenous Q12H  . multivitamin with minerals  1 tablet Oral Daily  . polyethylene glycol  17 g Oral QHS  . potassium chloride SA  20 mEq Oral Daily  . pramipexole  0.5 mg Oral Daily  . pramipexole  1 mg Oral QHS  . spironolactone  25 mg Oral Daily  . tamsulosin  0.4 mg Oral QHS   Continuous Infusions: . sodium chloride 250 mL (10/09/19 1236)  . ceFEPime (MAXIPIME) IV 1 g (10/09/19 0850)  . doxycycline (VIBRAMYCIN) IV 100 mg (10/09/19 1101)  . furosemide 120 mg (10/09/19 1241)     LOS: 5 days   Marylu Lund, MD [facility] Pager On Amion  If 7PM-7AM, please contact night-coverage 10/09/2019, 4:07 PM

## 2019-10-10 LAB — GLUCOSE, CAPILLARY
Glucose-Capillary: 131 mg/dL — ABNORMAL HIGH (ref 70–99)
Glucose-Capillary: 193 mg/dL — ABNORMAL HIGH (ref 70–99)
Glucose-Capillary: 315 mg/dL — ABNORMAL HIGH (ref 70–99)
Glucose-Capillary: 241 mg/dL — ABNORMAL HIGH (ref 70–99)

## 2019-10-10 LAB — MAGNESIUM: Magnesium: 2.7 mg/dL — ABNORMAL HIGH (ref 1.7–2.4)

## 2019-10-10 LAB — CBC
HCT: 37.5 % — ABNORMAL LOW (ref 39.0–52.0)
Hemoglobin: 12.1 g/dL — ABNORMAL LOW (ref 13.0–17.0)
MCH: 29.1 pg (ref 26.0–34.0)
MCHC: 32.3 g/dL (ref 30.0–36.0)
MCV: 90.1 fL (ref 80.0–100.0)
Platelets: 156 10*3/uL (ref 150–400)
RBC: 4.16 MIL/uL — ABNORMAL LOW (ref 4.22–5.81)
RDW: 16.4 % — ABNORMAL HIGH (ref 11.5–15.5)
WBC: 7.3 10*3/uL (ref 4.0–10.5)
nRBC: 0 % (ref 0.0–0.2)

## 2019-10-10 LAB — BASIC METABOLIC PANEL
Anion gap: 17 — ABNORMAL HIGH (ref 5–15)
BUN: 126 mg/dL — ABNORMAL HIGH (ref 8–23)
CO2: 34 mmol/L — ABNORMAL HIGH (ref 22–32)
Calcium: 9.3 mg/dL (ref 8.9–10.3)
Chloride: 85 mmol/L — ABNORMAL LOW (ref 98–111)
Creatinine, Ser: 2.24 mg/dL — ABNORMAL HIGH (ref 0.61–1.24)
GFR calc Af Amer: 32 mL/min — ABNORMAL LOW (ref >60)
GFR calc non Af Amer: 28 mL/min — ABNORMAL LOW (ref >60)
Glucose, Bld: 118 mg/dL — ABNORMAL HIGH (ref 70–99)
Potassium: 3.6 mmol/L (ref 3.5–5.1)
Sodium: 136 mmol/L (ref 135–145)

## 2019-10-10 LAB — BLOOD GAS, ARTERIAL
Acid-Base Excess: 13.9 mmol/L — ABNORMAL HIGH (ref 0.0–2.0)
Bicarbonate: 39.1 mmol/L — ABNORMAL HIGH (ref 20.0–28.0)
Delivery systems: POSITIVE
Drawn by: 36277
Expiratory PAP: 8
FIO2: 40
Inspiratory PAP: 16
Mode: POSITIVE
O2 Saturation: 95.9 %
Patient temperature: 97.4
pCO2 arterial: 59.2 mmHg — ABNORMAL HIGH (ref 32.0–48.0)
pH, Arterial: 7.432 (ref 7.350–7.450)
pO2, Arterial: 78.9 mmHg — ABNORMAL LOW (ref 83.0–108.0)

## 2019-10-10 MED ORDER — IPRATROPIUM-ALBUTEROL 0.5-2.5 (3) MG/3ML IN SOLN
3.0000 mL | Freq: Four times a day (QID) | RESPIRATORY_TRACT | Status: DC
  Administered 2019-10-11 (×2): 3 mL via RESPIRATORY_TRACT
  Filled 2019-10-10 (×2): qty 3

## 2019-10-10 NOTE — Progress Notes (Signed)
Physical Therapy Treatment Patient Details Name: Travis [name] MRN: [id number] DOB: 04-02-1946 Today's Date: 10/10/2019    History of Present Illness Pt adm with acute hypoxic respiratory faliure due to copd and heart failure. Pt on HFNC. PMH - morbid obesity, ckd, dm, diastolic heart failure, copd, peripheral neuropathy, multiple back surgeries, cervical surgery.     PT Comments    Pt remains weak and requiring more assist today to get to the chair. If pt/family choose for pt to return home recommend ambulance transport home and hoyer lift for home.    Follow Up Recommendations  Home health PT;Supervision/Assistance - 24 hour     Equipment Recommendations  Other (comment)(hoyer lift)    Recommendations for Other Services       Precautions / Restrictions Precautions Precautions: Fall    Mobility  Bed Mobility Overal bed mobility: Needs Assistance Bed Mobility: Supine to Sit     Supine to sit: +2 for physical assistance;Max assist;HOB elevated     General bed mobility comments: Assist to bring legs off of bed, elevate trunk into sitting and bring hips to EOB  Transfers Overall transfer level: Needs assistance Equipment used: Ambulation equipment used Transfers: Sit to/from Stand Sit to Stand: +2 physical assistance;Max assist;From elevated surface         General transfer comment: Assist to bring hips up. Incr time to rise. Used Stedy to move from bed to recliner. +2 max to rise from seat of steady and didn't achieve full upright stand.   Ambulation/Gait                 Stairs             Wheelchair Mobility    Modified Rankin (Stroke Patients Only)       Balance Overall balance assessment: Needs assistance Sitting-balance support: No upper extremity supported;Feet supported Sitting balance-Leahy Scale: Fair     Standing balance support: Bilateral upper extremity supported Standing balance-Leahy Scale: Poor Standing balance comment:  Stedy and +2 min assist. Stood x 2 minutes for pericare by family.                            Cognition Arousal/Alertness: Awake/alert Behavior During Therapy: WFL for tasks assessed/performed Overall Cognitive Status: Within Functional Limits for tasks assessed                                        Exercises      General Comments General comments (skin integrity, edema, etc.): Pt on HFNC 50% with SpO2 89-92%.      Pertinent Vitals/Pain Pain Assessment: No/denies pain    Home Living                      Prior Function            PT Goals (current goals can now be found in the care plan section) Acute Rehab PT Goals Patient Stated Goal: return home Progress towards PT goals: Not progressing toward goals - comment    Frequency    Min 3X/week      PT Plan Current plan remains appropriate    Co-evaluation              AM-PAC PT "6 Clicks" Mobility   Outcome Measure  Help needed turning from your back to your side while  in a flat bed without using bedrails?: Total Help needed moving from lying on your back to sitting on the side of a flat bed without using bedrails?: Total Help needed moving to and from a bed to a chair (including a wheelchair)?: Total Help needed standing up from a chair using your arms (e.g., wheelchair or bedside chair)?: Total Help needed to walk in hospital room?: Total Help needed climbing 3-5 steps with a railing? : Total 6 Click Score: 6    End of Session Equipment Utilized During Treatment: Oxygen Activity Tolerance: Patient limited by fatigue Patient left: in chair;with call bell/phone within reach;with family/visitor present Nurse Communication: Mobility status;Need for lift equipment(maximove pad in place and likely will need maximove) PT Visit Diagnosis: Other abnormalities of gait and mobility (R26.89);Muscle weakness (generalized) (M62.81);Difficulty in walking, not elsewhere classified  (R26.2)     Time: 1010-1032 PT Time Calculation (min) (ACUTE ONLY): 22 min  Charges:  $Therapeutic Activity: 8-22 mins                     [place] Pager [phone] Office Catherine 10/10/2019, 1:59 PM

## 2019-10-10 NOTE — Consult Note (Signed)
Palliative Care Consult Note Req: D. Bensimhon Reason: Goals of Care  Narrative:  [name]" Travis [name] is a 73 yo gentleman well known to the nursing staff here at [facility] and in the HF Clinic. He has end stage lung disease, CKD 4 and advanced diastolic heart failure and is at high risk for sudden death and his overall prognosis (even for surviving this hospitalization) is low.   I met with Travis [name], his wife Travis [name] and had his DILs and sons on the phone. Family very aware of how serious his condition is and have been providing fairly advanced in home care interventions (nurses in family) such as PRN bipap, IV lasix and titration of his home O2. Despite this great are at home he has still had about one admission a month for respiratory distress and also has frequent UTI and urinary retension. His family is also increasingly having caregiver fatigue and have noticed how much more demanding his care needs have been over the past few weeks.  Currently he is on high flow nasal cannula O2 at 30%, requires bipap, bedbound, issues with worsening renal failure and co2 retention.  PC asked to help with goals of care and to also address his code status which has unfortunately has been difficult for him to think about.  ASSESSMENT AND RECOMMENDATIONS:  [name] is extremely medically fragile, I am concerned based on his appearance this evening that he may not even be able to make it out of the hospital. He had difficulty following the conversation but was pleasant and could be gently reoriented to the conversation-he would desat with minimal talking. He did not complain of pain, his SOB at rest is manageable but he experiences distress with minimal exertion.  Code Status: Travis [name] is worried his family will see him as giving up if he elects for DNR-I reframed this discussion completely- it would be medically inappropriate to "code" this gentleman.I explained in the event of code he would likely not survive regardless of  the intervention and if he did survive it would be in much worse shape that he is currently in and his family would have to bear difficult decisions such as removing a breathing machine etc..  1. LIMITED CODE, NO CPR, NO DEFIB, NO INTUBATION,  BIPAP ok, ONLY INDICATED MEDS  CARE COORDINATION: I do not believe he is now or will ever be a candidate for a SNF- it sounds like he has very strong home support from family who understand heart failure and can help-they may need some respite care however. He would be very appropriate for hospice care if he is stable enough to leave the hospital. Patient and family recognized tonight that he is most likely facing EOL in the near future-I prepared them for the possibility of rapid decline and also for a period of "stable but struggling" depending on how thing go over the next hours to days.   I will continue to follow him closely and address any potential windows for hospice transition and home. He does tell me he wants to be at home where he can see ALL of his family.  VISITATION: Given how fragile he is and risk for progression to EOL, family should be allowed to visit within the [facility] guidelines and exceptions for this circumstance.  Will continue to follow and support  [name] and his family.  Lane Hacker, DO Palliative Medicine [phone]  Time: 70 min Greater than 50%  of this time was spent counseling and coordinating care related  to the above assessment and plan.

## 2019-10-10 NOTE — Progress Notes (Signed)
PROGRESS NOTE    Travis [name]  [id number] DOB: [birth year] DOA: 10/03/2019 PCP: [name], MD    Brief Narrative:  73 y.o. male past medical history of chronic diastolic heart failure, diabetes mellitus type 2, COPD essential hypertension obesity hypoventilation syndrome BiPAP, chronic kidney disease with a baseline creatinine around 2 with a recent admission for heart failure,comes in for shortness of breath in the ED he was found to be hypoxic requiring a nonrebreather chest x-ray showed bilateral atelectasis COVID-19 PCR was negative, on exam he has JVD with lower extremity edema.  Assessment & Plan:   Principal Problem:   Acute respiratory failure with hypoxia (HCC) Active Problems:   Tetraparesis (HCC)   Obesity, Class III, BMI 40-49.9 (morbid obesity) (HCC)   Type 2 diabetes mellitus with stage 3 chronic kidney disease, with long-term current use of insulin (HCC)   Acute CHF (congestive heart failure) (HCC)   Pressure injury of sacral region, stage 2 (HCC)   Obesity hypoventilation syndrome (HCC)   Acute respiratory failure with hypoxia due to chronic diastolic heart failure and possibly due to acute COPD exacerbation.: -Cardiology following. Recommendation to diurese, with addition of acetazolamide -Continued on IV Rocephin and IV doxycycline for possible community-acquired pneumonia -Pt is continued on IV steroids inhalers and continue on ongoing antibiotics for possible COPD exacerbation. -On 10/8, pt noted to have increased work of breathing and increased O2 requirements, requiring bipap. Pt continues to have good diuresis -Pt noted to refuse BiPAP and instead wanting to continue on high flow O2 when sleeping. Pt reminded to use BiPAP when sleeping -Mentation seems improved -Continuing to diurese well with 3.6L over past 24hrs. -Family at bedside. Concerns for noncompliance with therapy in setting of obesity hypoventillation. Cardiology recommends Palliative  Care. Given overall poor prognosis, this is very appropriate in this setting  Acute kidney injury on chronic kidney disease stage III with baseline creatinine around 2. -Continues with good urine output, tolerating diuretics -Cr stable at 2.24 -repeat bmet in AM  Diabetes mellitus type 2 insulin-dependent: -Glucose poorly controlled with concurrent IV steroids, albeit now improving -Continue SSI at resistant scale -Currently on lantus to 75 units bid with 15 units meal coverage -Glucose seem improved  Hyperlipidemia: -continued on statin -Seems to be stable presently  Depression: -Continue Lexapro as pt tolerates  Hypothyroidism: -Pt is continued on Synthroid 127mcg daily -Currently stable  Essential hypertension: -Continue IV Lasix with acetazolamide -Obesity hypoventilation syndrome on BiPAP at home, has not been wearing CPAP or BiPAP consistently on floor -BP stable at present  Pressure ulcer stage II present on admission. -Remains stable at this time  Pseudomonas bacteriuria -UA not suggestive of UTI with no WBCs noted -Urine cx is pos for pseudomonas resistant to cipro and gen -Pt already on cefepime for above concerns of PNA -Does not complain of dysuria symptoms  DVT prophylaxis: Lovenox subq Code Status: Full Family Communication: Pt in room, family at bedside Disposition Plan: Uncertain at this time  Consultants:   Cardiology  Palliative  Procedures:     Antimicrobials: Anti-infectives (From admission, onward)   Start     Dose/Rate Route Frequency Ordered Stop   10/06/19 0900  ceFEPIme (MAXIPIME) 1 g in sodium chloride 0.9 % 100 mL IVPB  Status:  Discontinued     1 g 200 mL/hr over 30 Minutes Intravenous Daily 10/06/19 0755 10/10/19 1014   10/05/19 1645  cefTRIAXone (ROCEPHIN) 1 g in sodium chloride 0.9 % 100 mL IVPB  Status:  Discontinued  1 g 200 mL/hr over 30 Minutes Intravenous Every 24 hours 10/05/19 1631 10/05/19 1633   10/05/19  1000  doxycycline (VIBRAMYCIN) 100 mg in sodium chloride 0.9 % 250 mL IVPB     100 mg 125 mL/hr over 120 Minutes Intravenous Every 12 hours 10/05/19 0927 10/11/19 0959   10/05/19 0930  doxycycline (VIBRAMYCIN) 100 mg in sodium chloride 0.9 % 250 mL IVPB  Status:  Discontinued     100 mg 125 mL/hr over 120 Minutes Intravenous Every 12 hours 10/05/19 0920 10/05/19 0927   10/04/19 1200  ceFEPIme (MAXIPIME) 2 g in sodium chloride 0.9 % 100 mL IVPB  Status:  Discontinued     2 g 200 mL/hr over 30 Minutes Intravenous Every 12 hours 10/04/19 1127 10/05/19 0920   10/03/19 2300  cefTRIAXone (ROCEPHIN) 2 g in sodium chloride 0.9 % 100 mL IVPB  Status:  Discontinued     2 g 200 mL/hr over 30 Minutes Intravenous Every 24 hours 10/03/19 2255 10/04/19 1057   10/03/19 2300  azithromycin (ZITHROMAX) 500 mg in sodium chloride 0.9 % 250 mL IVPB  Status:  Discontinued     500 mg 250 mL/hr over 60 Minutes Intravenous Every 24 hours 10/03/19 2255 10/05/19 0919      Subjective: Without complaints this AM  Objective: Vitals:   10/10/19 1110 10/10/19 1259 10/10/19 1523 10/10/19 1611  BP:    133/69  Pulse:  72    Resp:  (!) 23    Temp:      TempSrc:      SpO2: 92% 93% 93%   Weight:      Height:        Intake/Output Summary (Last 24 hours) at 10/10/2019 1825 Last data filed at 10/10/2019 1609 Gross per 24 hour  Intake 1406.02 ml  Output 3975 ml  Net -2568.98 ml   Filed Weights   10/08/19 0410 10/09/19 0706 10/10/19 0629  Weight: 134.2 kg 132.3 kg 134 kg    Examination: General exam: Conversant, in no acute distress Respiratory system: normal chest rise, clear, no audible wheezing Cardiovascular system: regular rhythm, s1-s2 Gastrointestinal system: obese, nontender, pos BS Central nervous system: No seizures, no tremors Extremities: No cyanosis, no joint deformities Skin: No rashes, no pallor Psychiatry: Affect normal // no auditory hallucinations   Data Reviewed: I have personally  reviewed following labs and imaging studies  CBC: Recent Labs  Lab 10/03/19 2250 10/04/19 0825  10/06/19 0252 10/07/19 0346 10/08/19 0414 10/09/19 0501 10/10/19 0603  WBC 9.3 8.5  --  7.4 8.6 7.6 6.2 7.3  NEUTROABS 8.0* 7.0  --   --   --   --   --   --   HGB 14.1 12.7*   < > 12.1* 12.0* 11.9* 12.0* 12.1*  HCT 42.8 40.1   < > 38.0* 36.6* 37.6* 37.1* 37.5*  MCV 91.1 93.0  --  91.3 89.5 90.8 90.0 90.1  PLT 218 199  --  179 168 166 145* 156   < > = values in this interval not displayed.   Basic Metabolic Panel: Recent Labs  Lab 10/06/19 0252 10/07/19 0346 10/08/19 0414 10/08/19 1200 10/09/19 0501 10/10/19 0603  NA 135 134* 134* 136 136 136  K 4.3 3.8 4.0 3.9 3.9 3.6  CL 88* 83* 82* 85* 86* 85*  CO2 35* 37* 33* 37* 34* 34*  GLUCOSE 378* 362* 391* 353* 273* 118*  BUN 92* 101* 114* 114* 120* 126*  CREATININE 2.56* 2.50* 2.61* 2.45* 2.11* 2.24*  CALCIUM 8.9 9.2 9.4 9.3 9.5 9.3  MG 2.4 2.4 2.4  --  2.5* 2.7*   GFR: Estimated Creatinine Clearance: 41.5 mL/min (A) (by C-G formula based on SCr of 2.24 mg/dL (H)). Liver Function Tests: Recent Labs  Lab 10/03/19 2250 10/04/19 0825  AST 19 18  ALT 26 25  ALKPHOS 108 89  BILITOT 0.3 0.6  PROT 7.5 6.5  ALBUMIN 3.5 3.1*   No results for input(s): LIPASE, AMYLASE in the last 168 hours. No results for input(s): AMMONIA in the last 168 hours. Coagulation Profile: Recent Labs  Lab 10/03/19 2250  INR 1.0   Cardiac Enzymes: No results for input(s): CKTOTAL, CKMB, CKMBINDEX, TROPONINI in the last 168 hours. BNP (last 3 results) No results for input(s): PROBNP in the last 8760 hours. HbA1C: No results for input(s): HGBA1C in the last 72 hours. CBG: Recent Labs  Lab 10/09/19 1627 10/09/19 2118 10/10/19 0728 10/10/19 1133 10/10/19 1611  GLUCAP 258* 176* 131* 193* 315*   Lipid Profile: No results for input(s): CHOL, HDL, LDLCALC, TRIG, CHOLHDL, LDLDIRECT in the last 72 hours. Thyroid Function Tests: No results for  input(s): TSH, T4TOTAL, FREET4, T3FREE, THYROIDAB in the last 72 hours. Anemia Panel: No results for input(s): VITAMINB12, FOLATE, FERRITIN, TIBC, IRON, RETICCTPCT in the last 72 hours. Sepsis Labs: Recent Labs  Lab 10/03/19 2325 10/05/19 0926 10/06/19 0252 10/07/19 0346  PROCALCITON  --  <0.10 <0.10 <0.10  LATICACIDVEN 1.6  --   --   --     Recent Results (from the past 240 hour(s))  SARS Coronavirus 2 [facility] order, Performed in [facility] hospital lab) Nasopharyngeal Nasopharyngeal Swab     Status: None   Collection Time: 10/03/19 11:27 PM   Specimen: Nasopharyngeal Swab  Result Value Ref Range Status   SARS Coronavirus 2 NEGATIVE NEGATIVE Final    Comment: (NOTE) If result is NEGATIVE SARS-CoV-2 target nucleic acids are NOT DETECTED. The SARS-CoV-2 RNA is generally detectable in upper and lower  respiratory specimens during the acute phase of infection. The lowest  concentration of SARS-CoV-2 viral copies this assay can detect is 250  copies / mL. A negative result does not preclude SARS-CoV-2 infection  and should not be used as the sole basis for treatment or other  patient management decisions.  A negative result may occur with  improper specimen collection / handling, submission of specimen other  than nasopharyngeal swab, presence of viral mutation(s) within the  areas targeted by this assay, and inadequate number of viral copies  (<250 copies / mL). A negative result must be combined with clinical  observations, patient history, and epidemiological information. If result is POSITIVE SARS-CoV-2 target nucleic acids are DETECTED. The SARS-CoV-2 RNA is generally detectable in upper and lower  respiratory specimens dur ing the acute phase of infection.  Positive  results are indicative of active infection with SARS-CoV-2.  Clinical  correlation with patient history and other diagnostic information is  necessary to determine patient infection status.  Positive results  do  not rule out bacterial infection or co-infection with other viruses. If result is PRESUMPTIVE POSTIVE SARS-CoV-2 nucleic acids MAY BE PRESENT.   A presumptive positive result was obtained on the submitted specimen  and confirmed on repeat testing.  While 2019 novel coronavirus  (SARS-CoV-2) nucleic acids may be present in the submitted sample  additional confirmatory testing may be necessary for epidemiological  and / or clinical management purposes  to differentiate between  SARS-CoV-2 and other Sarbecovirus currently known  to infect humans.  If clinically indicated additional testing with an alternate test  methodology [phone]) is advised. The SARS-CoV-2 RNA is generally  detectable in upper and lower respiratory sp ecimens during the acute  phase of infection. The expected result is Negative. Fact Sheet for Patients:  StrictlyIdeas.no Fact Sheet for Healthcare Providers: BankingDealers.co.za This test is not yet approved or cleared by the [place] FDA and has been authorized for detection and/or diagnosis of SARS-CoV-2 by FDA under an Emergency Use Authorization (EUA).  This EUA will remain in effect (meaning this test can be used) for the duration of the COVID-19 declaration under Section 564(b)(1) of the Act, 21 U.S.C. section 360bbb-3(b)(1), unless the authorization is terminated or revoked sooner. Performed at [facility], [place] [address], [place], [place] [address]   Blood Culture (routine x 2)     Status: None   Collection Time: 10/03/19 11:30 PM   Specimen: BLOOD  Result Value Ref Range Status   Specimen Description BLOOD  Final   Special Requests   Final    BOTTLES DRAWN AEROBIC AND ANAEROBIC Blood Culture adequate volume   Culture   Final    NO GROWTH 5 DAYS Performed at [facility], [place] [address] [place], [place] [address]    Report Status 10/09/2019 FINAL  Final  Blood Culture (routine x 2)      Status: None   Collection Time: 10/03/19 11:35 PM   Specimen: BLOOD LEFT ARM  Result Value Ref Range Status   Specimen Description BLOOD LEFT ARM  Final   Special Requests   Final    BOTTLES DRAWN AEROBIC AND ANAEROBIC Blood Culture adequate volume   Culture   Final    NO GROWTH 5 DAYS Performed at [facility], [place] [address], [place], [place] [address]    Report Status 10/09/2019 FINAL  Final  Urine culture     Status: Abnormal   Collection Time: 10/04/19 12:18 AM   Specimen: In/Out Cath Urine  Result Value Ref Range Status   Specimen Description IN/OUT CATH URINE  Final   Special Requests   Final    NONE Performed at [facility], [place] [address] [place], [place] [address]    Culture >=100,000 COLONIES/mL PSEUDOMONAS AERUGINOSA (A)  Final   Report Status 10/05/2019 FINAL  Final   Organism ID, Bacteria PSEUDOMONAS AERUGINOSA (A)  Final      Susceptibility   Pseudomonas aeruginosa - MIC*    CEFTAZIDIME 4 SENSITIVE Sensitive     CIPROFLOXACIN >=4 RESISTANT Resistant     GENTAMICIN RESISTANT Resistant     IMIPENEM 1 SENSITIVE Sensitive     PIP/TAZO 16 SENSITIVE Sensitive     CEFEPIME 8 SENSITIVE Sensitive     * >=100,000 COLONIES/mL PSEUDOMONAS AERUGINOSA  Respiratory Panel by PCR     Status: None   Collection Time: 10/06/19 12:35 PM   Specimen: Nasopharyngeal Swab; Respiratory  Result Value Ref Range Status   Adenovirus NOT DETECTED NOT DETECTED Final   Coronavirus 229E NOT DETECTED NOT DETECTED Final    Comment: (NOTE) The Coronavirus on the Respiratory Panel, DOES NOT test for the novel  Coronavirus (2019 nCoV)    Coronavirus HKU1 NOT DETECTED NOT DETECTED Final   Coronavirus NL63 NOT DETECTED NOT DETECTED Final   Coronavirus OC43 NOT DETECTED NOT DETECTED Final   Metapneumovirus NOT DETECTED NOT DETECTED Final   Rhinovirus / Enterovirus NOT DETECTED NOT DETECTED Final   Influenza A NOT  DETECTED NOT DETECTED Final   Influenza B NOT DETECTED NOT  DETECTED Final   Parainfluenza Virus 1 NOT DETECTED NOT DETECTED Final   Parainfluenza Virus 2 NOT DETECTED NOT DETECTED Final   Parainfluenza Virus 3 NOT DETECTED NOT DETECTED Final   Parainfluenza Virus 4 NOT DETECTED NOT DETECTED Final   Respiratory Syncytial Virus NOT DETECTED NOT DETECTED Final   Bordetella pertussis NOT DETECTED NOT DETECTED Final   Chlamydophila pneumoniae NOT DETECTED NOT DETECTED Final   Mycoplasma pneumoniae NOT DETECTED NOT DETECTED Final    Comment: Performed at [facility], [place] [address], [place], [place] [address]     Radiology Studies: No results found.  Scheduled Meds:  acetaZOLAMIDE  250 mg Oral BID   aspirin EC  81 mg Oral Daily   atorvastatin  20 mg Oral QPM   Chlorhexidine Gluconate Cloth  6 each Topical Daily   enoxaparin (LOVENOX) injection  70 mg Subcutaneous Q24H   escitalopram  20 mg Oral Daily   fesoterodine  8 mg Oral QHS   gabapentin  200 mg Oral TID   hydrALAZINE  37.5 mg Oral TID   insulin aspart  0-20 Units Subcutaneous TID WC   insulin aspart  0-5 Units Subcutaneous QHS   insulin aspart  15 Units Subcutaneous TID WC   insulin glargine  75 Units Subcutaneous BID   ipratropium-albuterol  3 mL Nebulization Q4H   levothyroxine  175 mcg Oral Q0600   methylPREDNISolone (SOLU-MEDROL) injection  40 mg Intravenous Q12H   multivitamin with minerals  1 tablet Oral Daily   polyethylene glycol  17 g Oral QHS   potassium chloride SA  20 mEq Oral Daily   pramipexole  0.5 mg Oral Daily   pramipexole  1 mg Oral QHS   spironolactone  25 mg Oral Daily   tamsulosin  0.4 mg Oral QHS   Continuous Infusions:  sodium chloride 10 mL/hr at 10/09/19 1700   doxycycline (VIBRAMYCIN) IV 100 mg (10/10/19 1039)   furosemide 120 mg (10/10/19 1244)     LOS: 6 days   Marylu Lund, MD [facility] Pager On Amion  If 7PM-7AM, please contact night-coverage 10/10/2019, 6:25 PM

## 2019-10-10 NOTE — Progress Notes (Signed)
RT NOTES: Placed patient back on bipap at patient's request.

## 2019-10-10 NOTE — Care Management Important Message (Signed)
Important Message  Patient Details  Name: Travis [name] MRN: [id number] Date of Birth: 09-Sep-1946   Medicare Important Message Given:  Yes     Shelda Altes 10/10/2019, 2:50 PM

## 2019-10-10 NOTE — Progress Notes (Signed)
RT NOTES: Removed patient from bipap and placed on HHFNC 30L/50% at patient's request.

## 2019-10-10 NOTE — Progress Notes (Addendum)
Advanced Heart Failure Rounding Note  PCP-Cardiologist: Mertie Moores, MD  [facility]: Dr. [name]   Subjective:    Out of bed and sitting in chair. Family present at beside. Was eating [place] when I entered the room. More alert today. On high flow O2.   Remains on IV lasix and acetazolamide. Metolazone held due to rising BUN. Weight up 4 lb from day prior, 291>>295. -3.6L out yesterday. Net I/Os -14L since admit.   Objective:   Weight Range: 134 kg Body mass index is 38.98 kg/m.   Vital Signs:   Temp:  [97.4 F (36.3 C)-98.4 F (36.9 C)] 98.4 F (36.9 C) (10/12 0629) Pulse Rate:  [66-78] 70 (10/12 0752) Resp:  [20-45] 28 (10/12 0752) BP: (114-129)/(61-67) 114/61 (10/12 0629) SpO2:  [91 %-100 %] 92 % (10/12 0752) FiO2 (%):  [40 %-50 %] 50 % (10/12 0752) Weight:  [134 kg] 134 kg (10/12 0629) Last BM Date: 10/08/19  Weight change: Filed Weights   10/08/19 0410 10/09/19 0706 10/10/19 0629  Weight: 134.2 kg 132.3 kg 134 kg    Intake/Output:   Intake/Output Summary (Last 24 hours) at 10/10/2019 1050 Last data filed at 10/10/2019 0500 Gross per 24 hour  Intake 1040.18 ml  Output 3600 ml  Net -2559.82 ml      Physical Exam    PHYSICAL EXAM: General:  Morbidly obese elderly WM. On high flow O2.  HEENT: normal Neck: thick neck, difficult to assess JVD. Carotids 2+ bilat; no bruits. No lymphadenopathy or thyromegaly appreciated. Cor: PMI nondisplaced. Regular rate & rhythm. No rubs, gallops or murmurs. Lungs: clear Abdomen: obese distended abdomen. No hepatosplenomegaly. No bruits or masses. Good bowel sounds. Extremities: no cyanosis, clubbing, rash, 1+ bilateral LEE, TED hoses present  Neuro: alert & oriented x 3, cranial nerves grossly intact. moves all 4 extremities w/o difficulty. Affect pleasant.   Telemetry   NSR 70s, occasional PVCs. Personally reviewed  Labs    CBC Recent Labs    10/09/19 0501 10/10/19 0603  WBC 6.2 7.3  HGB 12.0* 12.1*   HCT 37.1* 37.5*  MCV 90.0 90.1  PLT 145* [id number]   Basic Metabolic Panel Recent Labs    10/09/19 0501 10/10/19 0603  NA 136 136  K 3.9 3.6  CL 86* 85*  CO2 34* 34*  GLUCOSE 273* 118*  BUN 120* 126*  CREATININE 2.11* 2.24*  CALCIUM 9.5 9.3  MG 2.5* 2.7*   Liver Function Tests No results for input(s): AST, ALT, ALKPHOS, BILITOT, PROT, ALBUMIN in the last 72 hours. No results for input(s): LIPASE, AMYLASE in the last 72 hours. Cardiac Enzymes No results for input(s): CKTOTAL, CKMB, CKMBINDEX, TROPONINI in the last 72 hours.  BNP: BNP (last 3 results) Recent Labs    08/03/19 1040 09/15/19 2138 10/03/19 2250  BNP 70.1 28.6 51.6    ProBNP (last 3 results) No results for input(s): PROBNP in the last 8760 hours.   D-Dimer No results for input(s): DDIMER in the last 72 hours. Hemoglobin A1C No results for input(s): HGBA1C in the last 72 hours. Fasting Lipid Panel No results for input(s): CHOL, HDL, LDLCALC, TRIG, CHOLHDL, LDLDIRECT in the last 72 hours. Thyroid Function Tests No results for input(s): TSH, T4TOTAL, T3FREE, THYROIDAB in the last 72 hours.  Invalid input(s): FREET3  Other results:   Imaging    No results found.   Medications:     Scheduled Medications: . acetaZOLAMIDE  250 mg Oral BID  . aspirin EC  81 mg Oral  Daily  . atorvastatin  20 mg Oral QPM  . Chlorhexidine Gluconate Cloth  6 each Topical Daily  . enoxaparin (LOVENOX) injection  70 mg Subcutaneous Q24H  . escitalopram  20 mg Oral Daily  . fesoterodine  8 mg Oral QHS  . gabapentin  200 mg Oral TID  . hydrALAZINE  37.5 mg Oral TID  . insulin aspart  0-20 Units Subcutaneous TID WC  . insulin aspart  0-5 Units Subcutaneous QHS  . insulin aspart  15 Units Subcutaneous TID WC  . insulin glargine  75 Units Subcutaneous BID  . ipratropium-albuterol  3 mL Nebulization Q4H  . levothyroxine  175 mcg Oral Q0600  . methylPREDNISolone (SOLU-MEDROL) injection  40 mg Intravenous Q12H  .  multivitamin with minerals  1 tablet Oral Daily  . polyethylene glycol  17 g Oral QHS  . potassium chloride SA  20 mEq Oral Daily  . pramipexole  0.5 mg Oral Daily  . pramipexole  1 mg Oral QHS  . spironolactone  25 mg Oral Daily  . tamsulosin  0.4 mg Oral QHS    Infusions: . sodium chloride 10 mL/hr at 10/09/19 1700  . doxycycline (VIBRAMYCIN) IV 100 mg (10/10/19 1039)  . furosemide 120 mg (10/10/19 0310)    PRN Medications: sodium chloride, acetaminophen **OR** acetaminophen, albuterol, ipratropium-albuterol, ondansetron **OR** ondansetron (ZOFRAN) IV    Assessment/Plan   1. A/C Hypoxic/hypercarbic Respiratory Failure - multifactorial with COPD/ diastolic HF - Now off BIPAP. 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 Full aggressive care 2. Full care but DNR/DNI 3. Switch to comfort care - Given severe lung disease in setting of CKD 4 and diastolic HF, it is estimated he has at least 50% mortality over next 6-12 months. We do not see any way he can safely return home and he has been reluctant to consider SNF.  - his wife expressed concern over her ability to care for him adequately at home. They are open to Palliative Care consultation and would like to include their daughter as well. Consult placed.   2. A/C Diastolic Heart Failure -Most recent ECHO LVEF 60-65% with normal RV.  -3.6L out yesterday. Net I/Os negative 14 L since admit. -Still with significant lower extremity edmea. -long history of poor compliance w/ low salt diet and fluid restriction at home. Family brought in [place] for breakfast today  - Creatinine up from 2.11>>2.24. BUN continues to rise, now 126 - CO2 improving. ? Stopping acetazolamide and placing back on metolazone. Will defer to MD -Will continue IV lasix 120 mg twice a day &  acetazolamide for now - Continue TED hose   3. AKI on CKD Stage IV -Creatinine baseline 2.0-2.2 -Today 2.24. BUN rising (baseline BUN 90-100. It is 126 today) - Manage diuretics as above  4. LLE Erythema, Possible cellulitis.  -On doxycyline .  -WBC is not elevated.  -management per primary  4. CAD . Non-obstructive. LHC 08/2015: LM 40, LAD distal 40, LCx ostial 50, RCA proximal 40 - No chest pain - On ASA/statin  -Offb-blocker   5. DMII, poorly controleld -Management per IM. On insulin   6. UTI - recurrent pseudomonas UTI - on IV abx per primary team  7. Lethargy - likely component of CO2 retention and uremia  -improved. Off of bipap  Length of Stay: [address], [name]  10/10/2019, 10:50 AM  Advanced Heart Failure Team Pager [phone] (M-F; 7a - 4p)  Please contact [facility] for night-coverage after hours (4p -7a ) and weekends on [email or web address]  Patient seen and examined with the above-signed Advanced Practice Provider and/or Housestaff. I personally reviewed laboratory data, imaging studies and relevant notes. I independently examined the patient and formulated the important aspects of the plan. I have edited the note to reflect any of my changes or salient points. I have personally discussed the plan with the patient and/or family.  Continues to diurese well but weight up and still requiring BIPAP at times. Wife says he is very lethargic. Creatinine improving but BUN continue to climb -> now 126.   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 I have d/w Dr. Hilma Favors personally and she will see him this afternoon. Given MSOF, frequent readmits and poor QoL, I would strongly recommend transition to comfort care.   Glori Bickers, MD  4:12 PM

## 2019-10-11 DIAGNOSIS — R0603 Acute respiratory distress: Secondary | ICD-10-CM

## 2019-10-11 LAB — BASIC METABOLIC PANEL
Anion gap: 17 — ABNORMAL HIGH (ref 5–15)
BUN: 132 mg/dL — ABNORMAL HIGH (ref 8–23)
CO2: 32 mmol/L (ref 22–32)
Calcium: 9.3 mg/dL (ref 8.9–10.3)
Chloride: 87 mmol/L — ABNORMAL LOW (ref 98–111)
Creatinine, Ser: 2.26 mg/dL — ABNORMAL HIGH (ref 0.61–1.24)
GFR calc Af Amer: 32 mL/min — ABNORMAL LOW (ref >60)
GFR calc non Af Amer: 28 mL/min — ABNORMAL LOW (ref >60)
Glucose, Bld: 258 mg/dL — ABNORMAL HIGH (ref 70–99)
Potassium: 3.6 mmol/L (ref 3.5–5.1)
Sodium: 136 mmol/L (ref 135–145)

## 2019-10-11 LAB — GLUCOSE, CAPILLARY
Glucose-Capillary: 237 mg/dL — ABNORMAL HIGH (ref 70–99)
Glucose-Capillary: 280 mg/dL — ABNORMAL HIGH (ref 70–99)
Glucose-Capillary: 192 mg/dL — ABNORMAL HIGH (ref 70–99)
Glucose-Capillary: 111 mg/dL — ABNORMAL HIGH (ref 70–99)

## 2019-10-11 LAB — CBC
HCT: 38 % — ABNORMAL LOW (ref 39.0–52.0)
Hemoglobin: 12.3 g/dL — ABNORMAL LOW (ref 13.0–17.0)
MCH: 29.1 pg (ref 26.0–34.0)
MCHC: 32.4 g/dL (ref 30.0–36.0)
MCV: 90 fL (ref 80.0–100.0)
Platelets: 169 10*3/uL (ref 150–400)
RBC: 4.22 MIL/uL (ref 4.22–5.81)
RDW: 16.2 % — ABNORMAL HIGH (ref 11.5–15.5)
WBC: 7.4 10*3/uL (ref 4.0–10.5)
nRBC: 0 % (ref 0.0–0.2)

## 2019-10-11 LAB — MAGNESIUM: Magnesium: 2.7 mg/dL — ABNORMAL HIGH (ref 1.7–2.4)

## 2019-10-11 MED ORDER — INSULIN GLARGINE 100 UNIT/ML ~~LOC~~ SOLN
80.0000 [IU] | Freq: Two times a day (BID) | SUBCUTANEOUS | Status: DC
  Administered 2019-10-11 – 2019-10-12 (×2): 80 [IU] via SUBCUTANEOUS
  Filled 2019-10-11 (×4): qty 0.8

## 2019-10-11 MED ORDER — INSULIN ASPART 100 UNIT/ML ~~LOC~~ SOLN
18.0000 [IU] | Freq: Three times a day (TID) | SUBCUTANEOUS | Status: DC
  Administered 2019-10-11 – 2019-10-12 (×5): 18 [IU] via SUBCUTANEOUS

## 2019-10-11 MED ORDER — IPRATROPIUM-ALBUTEROL 0.5-2.5 (3) MG/3ML IN SOLN
3.0000 mL | Freq: Three times a day (TID) | RESPIRATORY_TRACT | Status: DC
  Administered 2019-10-11 – 2019-10-12 (×3): 3 mL via RESPIRATORY_TRACT
  Filled 2019-10-11 (×3): qty 3

## 2019-10-11 NOTE — Progress Notes (Signed)
PROGRESS NOTE    Travis [name]  [id number] DOB: 12/12/1946 DOA: 10/03/2019 PCP: [name], MD    Brief Narrative:  73 y.o. male past medical history of chronic diastolic heart failure, diabetes mellitus type 2, COPD essential hypertension obesity hypoventilation syndrome BiPAP, chronic kidney disease with a baseline creatinine around 2 with a recent admission for heart failure,comes in for shortness of breath in the ED he was found to be hypoxic requiring a nonrebreather chest x-ray showed bilateral atelectasis COVID-19 PCR was negative, on exam he has JVD with lower extremity edema.  Assessment & Plan:   Principal Problem:   Acute respiratory failure with hypoxia (HCC) Active Problems:   Tetraparesis (HCC)   Obesity, Class III, BMI 40-49.9 (morbid obesity) (HCC)   Type 2 diabetes mellitus with stage 3 chronic kidney disease, with long-term current use of insulin (HCC)   Acute CHF (congestive heart failure) (HCC)   Pressure injury of sacral region, stage 2 (HCC)   Obesity hypoventilation syndrome (HCC)   Acute respiratory failure with hypoxia due to chronic diastolic heart failure and possibly due to acute COPD exacerbation.: -Cardiology had been following and patient had been diuresed with lasix and metolazone. No longer on lasix given worsening renal function as of 10/13 -Pt has completed course of empiric rocephin and doxy for possible PNA -Pt is continued on IV steroids for COPD -Pt is BiPAP dependent and has not been fully compliant with positive pressure ventillation -Appreciate input by Cardiology and Palliative Care. Plan for focus on comfort with plan for home with hospice if pt can be weaned off high flow O2 for transfer home -This AM, pt continues to require 20L high flow O2, down to 10L high flow this afternoon -Overall prognosis is grim as pt's condition has deteriorated over past several days despite maximal treatment  Acute kidney injury on chronic kidney  disease stage III with baseline creatinine around 2. -Net negative 17L with diuresis -Despite aggressive diuresis, patient's condition continues to worsen -BUN up to 132 with serum bicarb of 32   Diabetes mellitus type 2 insulin-dependent: -Glucose poorly controlled with concurrent IV steroids, albeit now improving -Continue SSI at resistant scale -Increased lantus to 80 units with aspart up to 18 units before meals  Hyperlipidemia: -continued on statin -Seems to be stable presently  Depression: -Continue Lexapro as pt tolerates  Hypothyroidism: -Pt is continued on Synthroid 144mcg daily -presently stable  Essential hypertension: -Continue IV Lasix with acetazolamide -Obesity hypoventilation syndrome on BiPAP at home, has not been wearing CPAP or BiPAP consistently on floor -BP stable at present at present  Pressure ulcer stage II present on admission. -Remains stable currently  Pseudomonas bacteriuria -UA not suggestive of UTI with no WBCs noted -Completed empiric antibiotics  DVT prophylaxis: Lovenox subq Code Status: Full Family Communication: Pt in room, family at bedside Disposition Plan: Uncertain at this time  Consultants:   Cardiology  Palliative  Procedures:     Antimicrobials: Anti-infectives (From admission, onward)   Start     Dose/Rate Route Frequency Ordered Stop   10/06/19 0900  ceFEPIme (MAXIPIME) 1 g in sodium chloride 0.9 % 100 mL IVPB  Status:  Discontinued     1 g 200 mL/hr over 30 Minutes Intravenous Daily 10/06/19 0755 10/10/19 1014   10/05/19 1645  cefTRIAXone (ROCEPHIN) 1 g in sodium chloride 0.9 % 100 mL IVPB  Status:  Discontinued     1 g 200 mL/hr over 30 Minutes Intravenous Every 24 hours 10/05/19 1631 10/05/19  1633   10/05/19 1000  doxycycline (VIBRAMYCIN) 100 mg in sodium chloride 0.9 % 250 mL IVPB     100 mg 125 mL/hr over 120 Minutes Intravenous Every 12 hours 10/05/19 0927 10/11/19 0007   10/05/19 0930  doxycycline  (VIBRAMYCIN) 100 mg in sodium chloride 0.9 % 250 mL IVPB  Status:  Discontinued     100 mg 125 mL/hr over 120 Minutes Intravenous Every 12 hours 10/05/19 0920 10/05/19 0927   10/04/19 1200  ceFEPIme (MAXIPIME) 2 g in sodium chloride 0.9 % 100 mL IVPB  Status:  Discontinued     2 g 200 mL/hr over 30 Minutes Intravenous Every 12 hours 10/04/19 1127 10/05/19 0920   10/03/19 2300  cefTRIAXone (ROCEPHIN) 2 g in sodium chloride 0.9 % 100 mL IVPB  Status:  Discontinued     2 g 200 mL/hr over 30 Minutes Intravenous Every 24 hours 10/03/19 2255 10/04/19 1057   10/03/19 2300  azithromycin (ZITHROMAX) 500 mg in sodium chloride 0.9 % 250 mL IVPB  Status:  Discontinued     500 mg 250 mL/hr over 60 Minutes Intravenous Every 24 hours 10/03/19 2255 10/05/19 0919      Subjective: No complaints this AM  Objective: Vitals:   10/11/19 0822 10/11/19 1134 10/11/19 1525 10/11/19 1617  BP: 138/89   (!) 151/73  Pulse: 69 66 64   Resp: (!) 24 (!) 22  (!) 23  Temp:      TempSrc:      SpO2: 96% 92%  96%  Weight:      Height:        Intake/Output Summary (Last 24 hours) at 10/11/2019 1627 Last data filed at 10/11/2019 1500 Gross per 24 hour  Intake 942 ml  Output 4475 ml  Net -3533 ml   Filed Weights   10/08/19 0410 10/09/19 0706 10/10/19 0629  Weight: 134.2 kg 132.3 kg 134 kg    Examination: General exam: Lethargic but arousable, laying in bed, in nad Respiratory system: increased resp effort, decreased BS Cardiovascular system: regular rate, s1, s2 Gastrointestinal system: Soft, nondistended, positive BS Central nervous system: CN2-12 grossly intact, strength intact Extremities: Perfused, no clubbing Skin: Normal skin turgor, no notable skin lesions seen Psychiatry: Mood seems appropriate this AM, no auditory hallucinations  Data Reviewed: I have personally reviewed following labs and imaging studies  CBC: Recent Labs  Lab 10/07/19 0346 10/08/19 0414 10/09/19 0501 10/10/19 0603  10/11/19 0423  WBC 8.6 7.6 6.2 7.3 7.4  HGB 12.0* 11.9* 12.0* 12.1* 12.3*  HCT 36.6* 37.6* 37.1* 37.5* 38.0*  MCV 89.5 90.8 90.0 90.1 90.0  PLT 168 166 145* 156 [id number]   Basic Metabolic Panel: Recent Labs  Lab 10/07/19 0346 10/08/19 0414 10/08/19 1200 10/09/19 0501 10/10/19 0603 10/11/19 0423  NA 134* 134* 136 136 136 136  K 3.8 4.0 3.9 3.9 3.6 3.6  CL 83* 82* 85* 86* 85* 87*  CO2 37* 33* 37* 34* 34* 32  GLUCOSE 362* 391* 353* 273* 118* 258*  BUN 101* 114* 114* 120* 126* 132*  CREATININE 2.50* 2.61* 2.45* 2.11* 2.24* 2.26*  CALCIUM 9.2 9.4 9.3 9.5 9.3 9.3  MG 2.4 2.4  --  2.5* 2.7* 2.7*   GFR: Estimated Creatinine Clearance: 41.2 mL/min (A) (by C-G formula based on SCr of 2.26 mg/dL (H)). Liver Function Tests: No results for input(s): AST, ALT, ALKPHOS, BILITOT, PROT, ALBUMIN in the last 168 hours. No results for input(s): LIPASE, AMYLASE in the last 168 hours. No results for input(s):  AMMONIA in the last 168 hours. Coagulation Profile: No results for input(s): INR, PROTIME in the last 168 hours. Cardiac Enzymes: No results for input(s): CKTOTAL, CKMB, CKMBINDEX, TROPONINI in the last 168 hours. BNP (last 3 results) No results for input(s): PROBNP in the last 8760 hours. HbA1C: No results for input(s): HGBA1C in the last 72 hours. CBG: Recent Labs  Lab 10/10/19 1611 10/10/19 2126 10/11/19 0743 10/11/19 1107 10/11/19 1604  GLUCAP 315* 241* 237* 280* 192*   Lipid Profile: No results for input(s): CHOL, HDL, LDLCALC, TRIG, CHOLHDL, LDLDIRECT in the last 72 hours. Thyroid Function Tests: No results for input(s): TSH, T4TOTAL, FREET4, T3FREE, THYROIDAB in the last 72 hours. Anemia Panel: No results for input(s): VITAMINB12, FOLATE, FERRITIN, TIBC, IRON, RETICCTPCT in the last 72 hours. Sepsis Labs: Recent Labs  Lab 10/05/19 0926 10/06/19 0252 10/07/19 0346  PROCALCITON <0.10 <0.10 <0.10    Recent Results (from the past 240 hour(s))  SARS Coronavirus 2  [facility] order, Performed in [facility] hospital lab) Nasopharyngeal Nasopharyngeal Swab     Status: None   Collection Time: 10/03/19 11:27 PM   Specimen: Nasopharyngeal Swab  Result Value Ref Range Status   SARS Coronavirus 2 NEGATIVE NEGATIVE Final    Comment: (NOTE) If result is NEGATIVE SARS-CoV-2 target nucleic acids are NOT DETECTED. The SARS-CoV-2 RNA is generally detectable in upper and lower  respiratory specimens during the acute phase of infection. The lowest  concentration of SARS-CoV-2 viral copies this assay can detect is 250  copies / mL. A negative result does not preclude SARS-CoV-2 infection  and should not be used as the sole basis for treatment or other  patient management decisions.  A negative result may occur with  improper specimen collection / handling, submission of specimen other  than nasopharyngeal swab, presence of viral mutation(s) within the  areas targeted by this assay, and inadequate number of viral copies  (<250 copies / mL). A negative result must be combined with clinical  observations, patient history, and epidemiological information. If result is POSITIVE SARS-CoV-2 target nucleic acids are DETECTED. The SARS-CoV-2 RNA is generally detectable in upper and lower  respiratory specimens dur ing the acute phase of infection.  Positive  results are indicative of active infection with SARS-CoV-2.  Clinical  correlation with patient history and other diagnostic information is  necessary to determine patient infection status.  Positive results do  not rule out bacterial infection or co-infection with other viruses. If result is PRESUMPTIVE POSTIVE SARS-CoV-2 nucleic acids MAY BE PRESENT.   A presumptive positive result was obtained on the submitted specimen  and confirmed on repeat testing.  While 2019 novel coronavirus  (SARS-CoV-2) nucleic acids may be present in the submitted sample  additional confirmatory testing may be necessary for  epidemiological  and / or clinical management purposes  to differentiate between  SARS-CoV-2 and other Sarbecovirus currently known to infect humans.  If clinically indicated additional testing with an alternate test  methodology [phone]) is advised. The SARS-CoV-2 RNA is generally  detectable in upper and lower respiratory sp ecimens during the acute  phase of infection. The expected result is Negative. Fact Sheet for Patients:  StrictlyIdeas.no Fact Sheet for Healthcare Providers: BankingDealers.co.za This test is not yet approved or cleared by the [place] FDA and has been authorized for detection and/or diagnosis of SARS-CoV-2 by FDA under an Emergency Use Authorization (EUA).  This EUA will remain in effect (meaning this test can be used) for the duration of the  COVID-19 declaration under Section 564(b)(1) of the Act, 21 U.S.C. section 360bbb-3(b)(1), unless the authorization is terminated or revoked sooner. Performed at [facility], [place] [address], [place], [place] [address]   Blood Culture (routine x 2)     Status: None   Collection Time: 10/03/19 11:30 PM   Specimen: BLOOD  Result Value Ref Range Status   Specimen Description BLOOD  Final   Special Requests   Final    BOTTLES DRAWN AEROBIC AND ANAEROBIC Blood Culture adequate volume   Culture   Final    NO GROWTH 5 DAYS Performed at [facility], [place] [address] [place], [place] [address]    Report Status 10/09/2019 FINAL  Final  Blood Culture (routine x 2)     Status: None   Collection Time: 10/03/19 11:35 PM   Specimen: BLOOD LEFT ARM  Result Value Ref Range Status   Specimen Description BLOOD LEFT ARM  Final   Special Requests   Final    BOTTLES DRAWN AEROBIC AND ANAEROBIC Blood Culture adequate volume   Culture   Final    NO GROWTH 5 DAYS Performed at [facility], [place] [address], [place],  [address]    Report Status 10/09/2019 FINAL   Final  Urine culture     Status: Abnormal   Collection Time: 10/04/19 12:18 AM   Specimen: In/Out Cath Urine  Result Value Ref Range Status   Specimen Description IN/OUT CATH URINE  Final   Special Requests   Final    NONE Performed at [facility], [place] [address], [place], [place] [address]    Culture >=100,000 COLONIES/mL PSEUDOMONAS AERUGINOSA (A)  Final   Report Status 10/05/2019 FINAL  Final   Organism ID, Bacteria PSEUDOMONAS AERUGINOSA (A)  Final      Susceptibility   Pseudomonas aeruginosa - MIC*    CEFTAZIDIME 4 SENSITIVE Sensitive     CIPROFLOXACIN >=4 RESISTANT Resistant     GENTAMICIN RESISTANT Resistant     IMIPENEM 1 SENSITIVE Sensitive     PIP/TAZO 16 SENSITIVE Sensitive     CEFEPIME 8 SENSITIVE Sensitive     * >=100,000 COLONIES/mL PSEUDOMONAS AERUGINOSA  Respiratory Panel by PCR     Status: None   Collection Time: 10/06/19 12:35 PM   Specimen: Nasopharyngeal Swab; Respiratory  Result Value Ref Range Status   Adenovirus NOT DETECTED NOT DETECTED Final   Coronavirus 229E NOT DETECTED NOT DETECTED Final    Comment: (NOTE) The Coronavirus on the Respiratory Panel, DOES NOT test for the novel  Coronavirus (2019 nCoV)    Coronavirus HKU1 NOT DETECTED NOT DETECTED Final   Coronavirus NL63 NOT DETECTED NOT DETECTED Final   Coronavirus OC43 NOT DETECTED NOT DETECTED Final   Metapneumovirus NOT DETECTED NOT DETECTED Final   Rhinovirus / Enterovirus NOT DETECTED NOT DETECTED Final   Influenza A NOT DETECTED NOT DETECTED Final   Influenza B NOT DETECTED NOT DETECTED Final   Parainfluenza Virus 1 NOT DETECTED NOT DETECTED Final   Parainfluenza Virus 2 NOT DETECTED NOT DETECTED Final   Parainfluenza Virus 3 NOT DETECTED NOT DETECTED Final   Parainfluenza Virus 4 NOT DETECTED NOT DETECTED Final   Respiratory Syncytial Virus NOT DETECTED NOT DETECTED Final   Bordetella pertussis NOT DETECTED NOT DETECTED Final   Chlamydophila pneumoniae NOT DETECTED NOT DETECTED  Final   Mycoplasma pneumoniae NOT DETECTED NOT DETECTED Final    Comment: Performed at [facility], [place]. [address],  [place], [place] [address]     Radiology Studies: No results found.  Scheduled Meds: . aspirin EC  81 mg Oral Daily  . atorvastatin  20 mg Oral QPM  . Chlorhexidine Gluconate Cloth  6 each Topical Daily  . enoxaparin (LOVENOX) injection  70 mg Subcutaneous Q24H  . escitalopram  20 mg Oral Daily  . fesoterodine  8 mg Oral QHS  . gabapentin  200 mg Oral TID  . hydrALAZINE  37.5 mg Oral TID  . insulin aspart  0-20 Units Subcutaneous TID WC  . insulin aspart  0-5 Units Subcutaneous QHS  . insulin aspart  18 Units Subcutaneous TID WC  . insulin glargine  80 Units Subcutaneous BID  . ipratropium-albuterol  3 mL Nebulization TID  . levothyroxine  175 mcg Oral Q0600  . methylPREDNISolone (SOLU-MEDROL) injection  40 mg Intravenous Q12H  . multivitamin with minerals  1 tablet Oral Daily  . polyethylene glycol  17 g Oral QHS  . potassium chloride SA  20 mEq Oral Daily  . pramipexole  0.5 mg Oral Daily  . pramipexole  1 mg Oral QHS  . spironolactone  25 mg Oral Daily  . tamsulosin  0.4 mg Oral QHS   Continuous Infusions: . sodium chloride 10 mL/hr at 10/09/19 1700     LOS: 7 days   Marylu Lund, MD [facility] Pager On Amion  If 7PM-7AM, please contact night-coverage 10/11/2019, 4:27 PM

## 2019-10-11 NOTE — Progress Notes (Addendum)
Advanced Heart Failure Rounding Note  PCP-Cardiologist: Mertie Moores, MD  [facility]: Dr. [name]   Subjective:    More confused this am. Did not sleep the whole night w/ bipap. He requested it be taken off and switched to high flow [place]. CO2 down from 34>>32.   Diuresed an additional 4L yesterday. Net I/Os -17L total.   SCr stable at 2.26 but BUN continues to rise, up from 126>>132.   Pt and family met w/ palliative care team yesterday and agrees to comfort care. He would like to be at home w/ family. Palliative care team feels he is appropriate for home hospice.   Objective:   Weight Range: 134 kg Body mass index is 38.98 kg/m.   Vital Signs:   Temp:  [97.5 F (36.4 C)-98.6 F (37 C)] 98.3 F (36.8 C) (10/13 0359) Pulse Rate:  [64-72] 67 (10/13 0357) Resp:  [19-34] 34 (10/13 0357) BP: (101-133)/(53-69) 119/53 (10/13 0357) SpO2:  [92 %-98 %] 93 % (10/13 0018) FiO2 (%):  [40 %-50 %] 50 % (10/13 0447) Last BM Date: 10/08/19  Weight change: Filed Weights   10/08/19 0410 10/09/19 0706 10/10/19 0629  Weight: 134.2 kg 132.3 kg 134 kg    Intake/Output:   Intake/Output Summary (Last 24 hours) at 10/11/2019 0722 Last data filed at 10/11/2019 0334 Gross per 24 hour  Intake 2366.02 ml  Output 4150 ml  Net -1783.98 ml      Physical Exam    PHYSICAL EXAM: General:  Morbidly obese elderly WM.  No respiratory difficulty HEENT: normal Neck: supple. no JVD. Carotids 2+ bilat; no bruits. No lymphadenopathy or thyromegaly appreciated. Cor: PMI nondisplaced. Regular rate & rhythm. No rubs, gallops or murmurs. Lungs: clear Abdomen: obese and less distended, nontender. No hepatosplenomegaly. No bruits or masses. Good bowel sounds. Extremities: no cyanosis, clubbing, rash,  1+ bilateral LEE Neuro: alert & oriented x 3, cranial nerves grossly intact. moves all 4 extremities w/o difficulty. Affect pleasant.    Telemetry   NSR 70s. Personally reviewed  Labs    CBC  Recent Labs    10/10/19 0603 10/11/19 0423  WBC 7.3 7.4  HGB 12.1* 12.3*  HCT 37.5* 38.0*  MCV 90.1 90.0  PLT 156 161   Basic Metabolic Panel Recent Labs    10/10/19 0603 10/11/19 0423  NA 136 136  K 3.6 3.6  CL 85* 87*  CO2 34* 32  GLUCOSE 118* 258*  BUN 126* 132*  CREATININE 2.24* 2.26*  CALCIUM 9.3 9.3  MG 2.7* 2.7*   Liver Function Tests No results for input(s): AST, ALT, ALKPHOS, BILITOT, PROT, ALBUMIN in the last 72 hours. No results for input(s): LIPASE, AMYLASE in the last 72 hours. Cardiac Enzymes No results for input(s): CKTOTAL, CKMB, CKMBINDEX, TROPONINI in the last 72 hours.  BNP: BNP (last 3 results) Recent Labs    08/03/19 1040 09/15/19 2138 10/03/19 2250  BNP 70.1 28.6 51.6    ProBNP (last 3 results) No results for input(s): PROBNP in the last 8760 hours.   D-Dimer No results for input(s): DDIMER in the last 72 hours. Hemoglobin A1C No results for input(s): HGBA1C in the last 72 hours. Fasting Lipid Panel No results for input(s): CHOL, HDL, LDLCALC, TRIG, CHOLHDL, LDLDIRECT in the last 72 hours. Thyroid Function Tests No results for input(s): TSH, T4TOTAL, T3FREE, THYROIDAB in the last 72 hours.  Invalid input(s): FREET3  Other results:   Imaging    No results found.   Medications:  Scheduled Medications: . acetaZOLAMIDE  250 mg Oral BID  . aspirin EC  81 mg Oral Daily  . atorvastatin  20 mg Oral QPM  . Chlorhexidine Gluconate Cloth  6 each Topical Daily  . enoxaparin (LOVENOX) injection  70 mg Subcutaneous Q24H  . escitalopram  20 mg Oral Daily  . fesoterodine  8 mg Oral QHS  . gabapentin  200 mg Oral TID  . hydrALAZINE  37.5 mg Oral TID  . insulin aspart  0-20 Units Subcutaneous TID WC  . insulin aspart  0-5 Units Subcutaneous QHS  . insulin aspart  15 Units Subcutaneous TID WC  . insulin glargine  75 Units Subcutaneous BID  . ipratropium-albuterol  3 mL Nebulization QID  . levothyroxine  175 mcg Oral Q0600  .  methylPREDNISolone (SOLU-MEDROL) injection  40 mg Intravenous Q12H  . multivitamin with minerals  1 tablet Oral Daily  . polyethylene glycol  17 g Oral QHS  . potassium chloride SA  20 mEq Oral Daily  . pramipexole  0.5 mg Oral Daily  . pramipexole  1 mg Oral QHS  . spironolactone  25 mg Oral Daily  . tamsulosin  0.4 mg Oral QHS    Infusions: . sodium chloride 10 mL/hr at 10/09/19 1700  . furosemide 120 mg (10/11/19 0333)    PRN Medications: sodium chloride, acetaminophen **OR** acetaminophen, albuterol, ipratropium-albuterol, ondansetron **OR** ondansetron (ZOFRAN) IV    Assessment/Plan   1. A/C Hypoxic/hypercarbic Respiratory Failure - multifactorial with COPD/ diastolic HF - requires intermittent BIPAP. Currently on high flow O2 via [place]. CO2 32.  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 - long discussion held w/ pt and family regarding goals of care. They accept palliative care. Palliative care team feels he is appropriate for home hospice.    2. A/C Diastolic Heart Failure -Most recent ECHO LVEF 60-65% with normal RV.  -4L out yesterday. Net I/Os negative 17 L since admit. -Still with 1+ bilateral lower extremity edmea. - Scr stable at 2.2. BUN continues to rise, up from 126>>132 - Continue IV Lasix and acetazolamide - Continue TED hose and fluid restriction    3. AKI on CKD Stage IV -Creatinine baseline 2.0-2.2 -Today 2.2. BUN rising (baseline BUN 90-100. It is 132 today) -Manage diuretics as above  4. LLE Erythema, Possible cellulitis.  -On doxycyline. -WBC is not elevated.  -management per primary  4. CAD -Non-obstructive. LHC 08/2015: LM 40, LAD distal 40, LCx ostial 50, RCA proximal 40 - No chest pain - On ASA/statin  -Offb-blocker   5. DMII, poorly  controleld -Management per IM. On insulin   6. UTI - recurrent pseudomonas UTI - on IV abx per primary team  7. Lethargy - likely component of CO2 retention and uremia  - does not tolerate bipap well. Currently on high flow via [place] per pt request  Dispo: plan eventual d/c to home hospice. MD to determine timing of d/c    Length of Stay: [address], [place]  10/11/2019, 7:22 AM  Advanced Heart Failure Team Pager [phone] (M-F; 7a - 4p)  Please contact [facility] for night-coverage after hours (4p -7a ) and weekends on [email or web address]  Patient seen and examined with the above-signed Advanced Practice Provider and/or Housestaff. I personally reviewed laboratory data,  imaging studies and relevant notes. I independently examined the patient and formulated the important aspects of the plan. I have edited the note to reflect any of my changes or salient points. I have personally discussed the plan with the patient and/or family.  He is terminally ill. Very lethargic. Remains SOB.  On/off BIPAP.  Volume status ok. Appreciate Palliative Care team's input.  I think he needs switch to full comfort care.  BUN continues to rise now 132. Creatinin stabl.e   Will stop lasix today. Will f/u with family and Palliative Care team.     Glori Bickers, MD  11:17 AM

## 2019-10-12 DIAGNOSIS — I129 Hypertensive chronic kidney disease with stage 1 through stage 4 chronic kidney disease, or unspecified chronic kidney disease: Secondary | ICD-10-CM | POA: Diagnosis not present

## 2019-10-12 DIAGNOSIS — N183 Chronic kidney disease, stage 3 unspecified: Secondary | ICD-10-CM | POA: Diagnosis not present

## 2019-10-12 DIAGNOSIS — I5033 Acute on chronic diastolic (congestive) heart failure: Secondary | ICD-10-CM | POA: Diagnosis not present

## 2019-10-12 DIAGNOSIS — R0603 Acute respiratory distress: Secondary | ICD-10-CM

## 2019-10-12 DIAGNOSIS — E1129 Type 2 diabetes mellitus with other diabetic kidney complication: Secondary | ICD-10-CM | POA: Diagnosis not present

## 2019-10-12 DIAGNOSIS — E890 Postprocedural hypothyroidism: Secondary | ICD-10-CM | POA: Diagnosis not present

## 2019-10-12 DIAGNOSIS — I5030 Unspecified diastolic (congestive) heart failure: Secondary | ICD-10-CM | POA: Diagnosis not present

## 2019-10-12 DIAGNOSIS — N400 Enlarged prostate without lower urinary tract symptoms: Secondary | ICD-10-CM | POA: Diagnosis not present

## 2019-10-12 LAB — GLUCOSE, CAPILLARY
Glucose-Capillary: 187 mg/dL — ABNORMAL HIGH (ref 70–99)
Glucose-Capillary: 97 mg/dL (ref 70–99)
Glucose-Capillary: 133 mg/dL — ABNORMAL HIGH (ref 70–99)

## 2019-10-12 LAB — BASIC METABOLIC PANEL
Anion gap: 14 (ref 5–15)
BUN: 125 mg/dL — ABNORMAL HIGH (ref 8–23)
CO2: 33 mmol/L — ABNORMAL HIGH (ref 22–32)
Calcium: 9.2 mg/dL (ref 8.9–10.3)
Chloride: 91 mmol/L — ABNORMAL LOW (ref 98–111)
Creatinine, Ser: 2.03 mg/dL — ABNORMAL HIGH (ref 0.61–1.24)
GFR calc Af Amer: 36 mL/min — ABNORMAL LOW (ref >60)
GFR calc non Af Amer: 31 mL/min — ABNORMAL LOW (ref >60)
Glucose, Bld: 133 mg/dL — ABNORMAL HIGH (ref 70–99)
Potassium: 3.5 mmol/L (ref 3.5–5.1)
Sodium: 138 mmol/L (ref 135–145)

## 2019-10-12 LAB — CBC
HCT: 38.7 % — ABNORMAL LOW (ref 39.0–52.0)
Hemoglobin: 12.5 g/dL — ABNORMAL LOW (ref 13.0–17.0)
MCH: 29.1 pg (ref 26.0–34.0)
MCHC: 32.3 g/dL (ref 30.0–36.0)
MCV: 90 fL (ref 80.0–100.0)
Platelets: 181 10*3/uL (ref 150–400)
RBC: 4.3 MIL/uL (ref 4.22–5.81)
RDW: 15.9 % — ABNORMAL HIGH (ref 11.5–15.5)
WBC: 9.1 10*3/uL (ref 4.0–10.5)
nRBC: 0 % (ref 0.0–0.2)

## 2019-10-12 LAB — MAGNESIUM: Magnesium: 2.8 mg/dL — ABNORMAL HIGH (ref 1.7–2.4)

## 2019-10-12 MED ORDER — GLYCOPYRROLATE 2 MG PO TABS: 2.0000 mg | ORAL_TABLET | Freq: Three times a day (TID) | ORAL | 0 refills | Status: AC | PRN

## 2019-10-12 MED ORDER — MORPHINE SULFATE (CONCENTRATE) 20 MG/ML PO SOLN: 10.0000 mg | ORAL | 0 refills | Status: AC | PRN

## 2019-10-12 MED ORDER — LORAZEPAM 1 MG PO TABS: 1.0000 mg | ORAL_TABLET | Freq: Four times a day (QID) | ORAL | 0 refills | Status: AC | PRN

## 2019-10-12 MED ORDER — OXYCODONE HCL 20 MG/ML PO CONC
5.0000 mg | ORAL | Status: DC | PRN
  Filled 2019-10-12: qty 1

## 2019-10-12 MED ORDER — LORAZEPAM 2 MG/ML PO CONC
0.5000 mg | ORAL | Status: DC | PRN
  Filled 2019-10-12: qty 0.25

## 2019-10-12 NOTE — Progress Notes (Addendum)
Pt was placed on bipap by RRT Cyril Mourning

## 2019-10-12 NOTE — Consult Note (Signed)
   [facility] CM Inpatient Consult   10/12/2019  Travis [name] [birth year] [id number]     Patientwaschecked for 45% extreme highrisk score for unplanned readmission, with a 30 day readmission and 5 hospitalizations in the past 6 months; and to check ,and for need of [facility] care management services.  Patient hadprevious outreach from [facility] for [employer].  Review of chart and MD note on 10/12/19 show as: 73 year old patient was hospitalized for acute on chronic respiratory failure with hypoxia likely due to diastolic CHF exacerbation.  Advanced heart failure team consulted and guided management. Patient was diuresed aggressively with IV Lasix without significant improvement in his respiratory status.  Palliative care consulted.  After discussion with patient and patient's family, patient elected to go home with home hospice for comfort measures.   Notedthat patient is currently listed as an Engaged [facility] patient. Hewill be followed by [facility] in the community, with full case management services.  [facility] care management services are not appropriate at this time.  Will sign off.   For questions, please contact:  Edwena Felty A. Annalynne Ibanez, BSN, RN-BC [facility] Liaison Cell: [phone]

## 2019-10-12 NOTE — Progress Notes (Signed)
Pt placed back on HF [place] by RRT Kristen per pt request

## 2019-10-12 NOTE — Progress Notes (Signed)
[facility]: White equipment in home at 530pm. Confirmed with the pt's wife she is ready to receive pt at home. Set up ambulance transportation with [facility] transport. They will pick pt up at 630-645pm. Notified wife of time and nurse with Hospice so they would be in home at same time  Notified Abigail RN caring for pt.  of ambulance pick up time.  Webb Silversmith RN

## 2019-10-12 NOTE — Progress Notes (Signed)
Advanced Heart Failure Rounding Note  PCP-Cardiologist: Mertie Moores, MD  [facility]: Dr. [name]   Subjective:    He continues to struggle with severe SOB and confusion/lethargy. On/off BIPAP.   Lasix stopped yesterday due to rising BUN (132)  Objective:   Weight Range: 130.3 kg Body mass index is 37.9 kg/m.   Vital Signs:   Temp:  [97.6 F (36.4 C)-98.4 F (36.9 C)] 98.4 F (36.9 C) (10/14 0757) Pulse Rate:  [59-66] 60 (10/14 0900) Resp:  [11-26] 16 (10/14 0900) BP: (112-151)/(53-85) 112/53 (10/14 0833) SpO2:  [92 %-98 %] 93 % (10/14 0900) FiO2 (%):  [40 %-50 %] 40 % (10/14 0300) Weight:  [130.3 kg] 130.3 kg (10/14 0432) Last BM Date: 10/11/19  Weight change: Filed Weights   10/09/19 0706 10/10/19 0629 10/12/19 0432  Weight: 132.3 kg 134 kg 130.3 kg    Intake/Output:   Intake/Output Summary (Last 24 hours) at 10/12/2019 1126 Last data filed at 10/12/2019 0753 Gross per 24 hour  Intake 600 ml  Output 1900 ml  Net -1300 ml      Physical Exam    PHYSICAL EXAM: General:  Morbidly obese elderly WM. Lying in bed on HFNC. Ill appearing HEENT: normal Neck: supple. JVP hard to see. Carotids 2+ bilat; no bruits. No lymphadenopathy or thryomegaly appreciated. Cor: PMI nondisplaced. Regular rate & rhythm. No rubs, gallops or murmurs. Lungs: poor air movement throghout Abdomen: obese soft, nontender, nondistended. No hepatosplenomegaly. No bruits or masses. Good bowel sounds. Extremities: no cyanosis, clubbing, rash, trace edema Neuro: lethargic at times. Can follow commands     Telemetry   NSR 60-70s. Personally reviewed  Labs    CBC Recent Labs    10/11/19 0423 10/12/19 0306  WBC 7.4 9.1  HGB 12.3* 12.5*  HCT 38.0* 38.7*  MCV 90.0 90.0  PLT 169 0000000   Basic Metabolic Panel Recent Labs    10/11/19 0423 10/12/19 0306  NA 136 138  K 3.6 3.5  CL 87* 91*  CO2 32 33*  GLUCOSE 258* 133*  BUN 132* 125*  CREATININE 2.26* 2.03*  CALCIUM 9.3  9.2  MG 2.7* 2.8*   Liver Function Tests No results for input(s): AST, ALT, ALKPHOS, BILITOT, PROT, ALBUMIN in the last 72 hours. No results for input(s): LIPASE, AMYLASE in the last 72 hours. Cardiac Enzymes No results for input(s): CKTOTAL, CKMB, CKMBINDEX, TROPONINI in the last 72 hours.  BNP: BNP (last 3 results) Recent Labs    08/03/19 1040 09/15/19 2138 10/03/19 2250  BNP 70.1 28.6 51.6    ProBNP (last 3 results) No results for input(s): PROBNP in the last 8760 hours.   D-Dimer No results for input(s): DDIMER in the last 72 hours. Hemoglobin A1C No results for input(s): HGBA1C in the last 72 hours. Fasting Lipid Panel No results for input(s): CHOL, HDL, LDLCALC, TRIG, CHOLHDL, LDLDIRECT in the last 72 hours. Thyroid Function Tests No results for input(s): TSH, T4TOTAL, T3FREE, THYROIDAB in the last 72 hours.  Invalid input(s): FREET3  Other results:   Imaging    No results found.   Medications:     Scheduled Medications: . aspirin EC  81 mg Oral Daily  . atorvastatin  20 mg Oral QPM  . Chlorhexidine Gluconate Cloth  6 each Topical Daily  . enoxaparin (LOVENOX) injection  70 mg Subcutaneous Q24H  . escitalopram  20 mg Oral Daily  . fesoterodine  8 mg Oral QHS  . gabapentin  200 mg Oral TID  .  hydrALAZINE  37.5 mg Oral TID  . insulin aspart  0-20 Units Subcutaneous TID WC  . insulin aspart  0-5 Units Subcutaneous QHS  . insulin aspart  18 Units Subcutaneous TID WC  . insulin glargine  80 Units Subcutaneous BID  . ipratropium-albuterol  3 mL Nebulization TID  . levothyroxine  175 mcg Oral Q0600  . methylPREDNISolone (SOLU-MEDROL) injection  40 mg Intravenous Q12H  . multivitamin with minerals  1 tablet Oral Daily  . polyethylene glycol  17 g Oral QHS  . potassium chloride SA  20 mEq Oral Daily  . pramipexole  0.5 mg Oral Daily  . pramipexole  1 mg Oral QHS  . spironolactone  25 mg Oral Daily  . tamsulosin  0.4 mg Oral QHS    Infusions: .  sodium chloride 10 mL/hr at 10/09/19 1700    PRN Medications: sodium chloride, acetaminophen **OR** acetaminophen, albuterol, ipratropium-albuterol, LORazepam, ondansetron **OR** ondansetron (ZOFRAN) IV, oxyCODONE    Assessment/Plan   1. A/C Hypoxic/hypercarbic Respiratory Failure - multifactorial with COPD/ diastolic HF - requires intermittent BIPAP. Currently on high flow O2 via [place]. CO2 32.  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 Respiratory status very tenuous despite aggressive diuresis. I discussed with him, his daughter-in-law (who is a cardiac nurse), Dr. Hilma Favors and Dr. Cyndia Skeeters. Plan will be to discharge home today with Hospice as it is his wish to pass at home. Care team will need to have access to morphine to assure that he doesn't suffer.    2. A/C Diastolic Heart Failure -Most recent ECHO LVEF 60-65% with normal RV.  -excellent diruesis. Off lasix now.  - see plan as above   3. AKI on CKD Stage IV -Creatinine baseline 2.0-2.2 -Today 2.0 but BUN 125. Suspect uremia contributing to lethargy and CO2 narcosis   Dispo: Home today with hospice care  Total time spent 40 minutes. Over half that time spent discussing above.      Length of Stay: [place], [place]  10/12/2019, 11:26 AM  Advanced Heart Failure Team Pager [phone] (M-F; 7a - 4p)  Please contact [facility] [place] [facility] for night-coverage after hours (4p -7a ) and weekends on [email or web address]

## 2019-10-12 NOTE — TOC Progression Note (Signed)
Transition of Care [facility]) - Progression Note    Patient Details  Name: Travis [name] MRN: [id number] Date of Birth: December 03, 1946  [facility]) CM/SW Contact  Graves-Bigelow, Ocie Cornfield, RN Phone Number: 10/12/2019, 10:46 AM  Clinical Narrative: CM received referral for home hospice services. CM did speak with family regarding Hospice Choice. Family agreeable for [facility]. Referral made to Webb Silversmith for [facility]. SOC to begin 10-12-19. Family in need of Oxymizer in the home for high flow 02 and assistance with putting up the hospital bed that they already have from [facility]. Cheri working on getting [facility] in the home for bed assistance and Oxymizer delivery. Once hospital bed has been assembled- CM will call [facility] for home transport. Wife to be in the home to receive the patient. No further needs from CM at this time.   Expected Discharge Plan: [place] Barriers to Discharge: No Barriers Identified  Expected Discharge Plan and Services Expected Discharge Plan: [place] In-house Referral: Hospice / Palliative Care Discharge Planning Services: CM Consult Post Acute Care Choice: Hospice Living arrangements for the past 2 months: Single Family Home                 HH Arranged: RN [facility] Agency: [place] Date [place]: 10/12/19 Time [place]: [place] Representative spoke with at [place]: [place] (SDOH) Interventions    Readmission Risk Interventions Readmission Risk Prevention Plan 10/12/2019 10/07/2019 07/18/2019  Transportation Screening - Complete Complete  PCP or Specialist Appt within 3-5 Days - - Complete  HRI or [place] - - Complete  Social Work Consult for [place] Planning/Counseling - - Complete  Palliative Care Screening - - Not Applicable  Medication Review Press photographer) - Complete Complete  HRI or [place]  - (No Data) -  SW Recovery Care/Counseling Consult Complete - -  Palliative Care Screening Complete (No Data) -  [place] Not Applicable - -  Some recent data might be hidden

## 2019-10-12 NOTE — Progress Notes (Signed)
Pt and daughter [name]) at beside. RN explained the recent order for oxycodone and ativan as needed for transport home for shortness or breath or pain. Pt and daughter refused this medication. Pt stated he does not feel he needs this medication at this time for transportation. Will continue to monitor.

## 2019-10-12 NOTE — Progress Notes (Signed)
Palliative Medicine RN Note: PMT Medical Director rec'd call this am asking if pt can go home with hospice.   I called RNCM Hassan Rowan, who had already spoken with [facility] liaison Cheri yesterday. Pt has no benzos or opioids ordered, so Dr Hilma Favors gave order for those prn, specifically to pre-medicate for the ride home.   Spoke with RN; pt has been denying pain and shortness of breath.  During my assessment, pt is sleepy but arouses easily. He is on 10 LPM via [place]. I told him I am concerned about his comfort on the ride home, and he did agree to take pre-medication for it.  Marjie Skiff Eloisa Chokshi, RN, BSN, [facility] Palliative Medicine Team 10/12/2019 10:36 AM Office [phone]

## 2019-10-12 NOTE — TOC Transition Note (Signed)
Transition of Care [facility]) - CM/SW Discharge Note   Patient Details  Name: Travis [name] MRN: [id number] Date of Birth: 14-Jun-1946  Transition of Care [facility]) CM/SW Contact:  Bethena Roys, RN Phone Number: 10/12/2019, 1:44 PM   Clinical Narrative:  Hospital bed has been assembled in the home- wife declined hoyer lift. Awaiting call from family that DME 10 Liter 02 tank and Oxymizer has been delivered for high flow oxygen. Once 02 delivered- CM will call [facility] for transport. Family to take written Rx's to pharmacy to have them filled and ready to be in the home upon arrival. No further needs at this time.   Final next level of care: Home w Hospice Care Barriers to Discharge: No Barriers Identified   Patient Goals and CMS Choice Patient states their goals for this hospitalization and ongoing recovery are:: "to return home with Hospice" CMS Medicare.gov Compare Post Acute Care list provided to:: Patient Represenative (must comment)(Daughter-n-law Hilaria Ota) Choice offered to / list presented to : (Daughter-n-law Hilaria Ota)  Discharge Plan and Services In-house Referral: Hospice / Palliative Care Discharge Planning Services: CM Consult Post Acute Care Choice: Hospice            HH Arranged: RN [place] Date [place]: 10/12/19 Time [place]: [id number] Representative spoke with at [place]: [place] ([place]) Interventions     Readmission Risk Interventions Readmission Risk Prevention Plan 10/12/2019 10/07/2019 07/18/2019  Transportation Screening - Complete Complete  PCP or Specialist Appt within 3-5 Days - - Complete  HRI or Home Care Consult - - Complete  Social Work Consult for [place] Planning/Counseling - - Complete  Palliative Care Screening - - Not Applicable  Medication Review Press photographer) - Complete Complete  HRI or [place] - (No Data) -  SW Recovery  Care/Counseling Consult Complete - -  Palliative Care Screening Complete (No Data) -  [place] Not Applicable - -  Some recent data might be hidden

## 2019-10-12 NOTE — TOC Transition Note (Signed)
Transition of Care [facility]) - CM/SW Discharge Note   Patient Details  Name: Travis [name] MRN: [id number] Date of Birth: 15-Aug-1946  [facility] CM/SW Contact:  Bethena Roys, RN Phone Number: 10/12/2019, 4:19 PM   Clinical Narrative:   CM was notified via [place] from [place] that 02 will be delivered to home by 1800. Cherie will call [facility] for transport home- then will communicate with Staff RN for transition home. DNR, Medical Necessity and Face sheet on shadow chart. No further needs from CM at this time.   Final next level of care: Home w Hospice Care Barriers to Discharge: No Barriers Identified   Patient Goals and CMS Choice Patient states their goals for this hospitalization and ongoing recovery are:: "to return home with Hospice" CMS Medicare.gov Compare Post Acute Care list provided to:: Patient Represenative (must comment)(Daughter-n-law Hilaria Ota) Choice offered to / list presented to : (Daughter-n-law Hilaria Ota)  Discharge Plan and Services In-house Referral: Hospice / Palliative Care Discharge Planning Services: CM Consult Post Acute Care Choice: Hospice            HH Arranged: RN [place] Date [place]: 10/12/19 Time [place]: [id number] Representative spoke with at [place]: [place] ([place]) Interventions     Readmission Risk Interventions Readmission Risk Prevention Plan 10/12/2019 10/07/2019 07/18/2019  Transportation Screening - Complete Complete  PCP or Specialist Appt within 3-5 Days - - Complete  HRI or Home Care Consult - - Complete  Social Work Consult for [place] Planning/Counseling - - Complete  Palliative Care Screening - - Not Applicable  Medication Review Press photographer) - Complete Complete  HRI or [place] - (No Data) -  SW Recovery Care/Counseling Consult Complete - -  Palliative Care  Screening Complete (No Data) -  [place] Not Applicable - -  Some recent data might be hidden

## 2019-10-12 NOTE — Discharge Summary (Signed)
Physician Discharge Summary  Travis [name] TGY:[id number] DOB: 05-26-1946 DOA: 10/03/2019  PCP: [name], MD  Admit date: 10/03/2019 Discharge date: 10/12/2019  Admitted From: Home Disposition: Home with home Graham: None Equipment/Devices: None  Discharge Condition: Guarded prognosis CODE STATUS: DNR/DNI  Hospital Course: 73 y.o.malepast medical history of chronic diastolic heart failure, diabetes mellitus type 2, COPD essential hypertension obesity hypoventilation syndrome BiPAP, chronic kidney disease with a baseline creatinine around 2 with a recent admission for heart failure,comes in for shortness of breath in the ED he was found to be hypoxic requiring a nonrebreather chest x-ray showed bilateral atelectasis COVID-19 PCR was negative, on exam he has JVD with lower extremity edema.   Patient was hospitalized for acute on chronic respiratory failure with hypoxia likely due to diastolic CHF exacerbation.  Advanced heart failure team consulted and guided management.  Patient was diuresed aggressively with IV Lasix without significant improvement in his respiratory status.  Palliative care consulted.  After discussion with patient and patient's family, patient elected to go home with home hospice for comfort measures.   See individual problem list below for more on hospital course.  Discharge Diagnoses:  Acute respiratory failure with hypoxia likely due to acute on chronic diastolic CHF and and COPD exacerbation. -Appreciate guidance by advanced heart team and palliative care -Tenuous respiratory status despite aggressive diuresis.  Concern about end-stage lung disease. -Plan is to go home with home hospice with emphasis on comfort measures. -We will continue home diuretics and breathing treatments for symptoms. -As needed Roxanol, Ativan and glycopyrrolate.  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 -Care as above.  Poorly controlled  IDDM-2 with CKD-3. -May continue home medications.  Hyperlipidemia -Discontinued statin-emphasis on comfort measures  Depression -Discontinue Lexapro  Pseudomonas bacteriuria -Completed antibiotic course.  Morbid obesity: BMI 37.9. -Comfort measures.  Essential hypertension -Cardiac meds as below.  Stage II pressure ulcer -Comfort measures.  Discharge Instructions  Discharge Instructions    Diet general   Complete by: As directed      Allergies as of 10/12/2019      Reactions   Amlodipine Swelling, Other (See Comments)   Causes edema (per [facility] paperwork)   Plendil [felodipine] Swelling, Other (See Comments)   Causes edema (per [facility] paperwork)   Compazine [prochlorperazine] Other (See Comments)   Caused agitation (per [facility] paperwork)   Cymbalta [duloxetine Hcl] Other (See Comments)   Made the patient "feel weird," per [facility] paperwork   Jardiance [empagliflozin] Other (See Comments)   Stopped by MD because of unwanted side effects   Other Nausea Only, Other (See Comments)   ANESTHESIA UNSPECIFIED- makes sick, must have anti-nausea medication in advance   Levofloxacin In D5w Itching   Possible infusion reaction 03/29/17. Tolerating oral Levaquin 03/30/17   Septra [sulfamethoxazole-trimethoprim] Nausea Only, Other (See Comments)   Per [facility] paperwork      Medication List    STOP taking these medications   aspirin EC 81 MG tablet   atorvastatin 20 MG tablet Commonly known as: LIPITOR   escitalopram 20 MG tablet Commonly known as: LEXAPRO     TAKE these medications   FreeStyle Libre 14 Day Reader Kerrin Mo 1 kit by Does not apply route 3 (three) times daily.   FreeStyle Libre 14 Day Sensor Misc 1 kit by Does not apply route 3 (three) times daily.   gabapentin 400 MG capsule Commonly known as: NEURONTIN Take 400 mg by mouth 3 (three) times  daily.   glucose blood test strip Use as instructed to test blood  sugars 4 times daily DX E11.42   glycopyrrolate 2 MG tablet Commonly known as: ROBINUL Take 1 tablet (2 mg total) by mouth 3 (three) times daily as needed for up to 5 days (secretion).   hydrALAZINE 25 MG tablet Commonly known as: APRESOLINE Take 1.5 tablets (37.5 mg total) by mouth 3 (three) times daily.   Insulin Pen Needle 32G X 4 MM Misc 1 Device by Does not apply route 4 (four) times daily.   Lantus SoloStar 100 UNIT/ML Solostar Pen Generic drug: Insulin Glargine Inject 60 Units into the skin at bedtime. What changed: how much to take   levothyroxine 175 MCG tablet Commonly known as: SYNTHROID Take 1 tablet (175 mcg total) by mouth daily.   LORazepam 1 MG tablet Commonly known as: Ativan Take 1 tablet (1 mg total) by mouth every 6 (six) hours as needed for up to 5 days for anxiety.   metolazone 2.5 MG tablet Commonly known as: ZAROXOLYN Take 1 tablet (2.5 mg total) by mouth daily.   morphine 20 MG/ML concentrated solution Commonly known as: ROXANOL Take 0.5 mLs (10 mg total) by mouth every 3 (three) hours as needed for up to 3 days for severe pain, anxiety or shortness of breath.   multivitamin Tabs tablet Take 1 tablet by mouth daily.   NovoLOG FlexPen 100 UNIT/ML FlexPen Generic drug: insulin aspart Inject 22 Units into the skin 3 (three) times daily with meals.   onetouch ultrasoft lancets Use as instructed to test blood sugars 4 times daily DX E11.42   OneTouch Verio w/Device Kit 1 kit by Does not apply route 4 (four) times daily. Use as directed to test blood sugars 4 times daily DX E11.42   OXYGEN Inhale 2-3 L/min into the lungs continuous.   polyethylene glycol 17 g packet Commonly known as: MIRALAX / GLYCOLAX Take 17 g by mouth daily as needed. For constipation What changed:   when to take this  additional instructions   potassium chloride SA 20 MEQ tablet Commonly known as: KLOR-CON Take 1 tablet by mouth three times daily.   pramipexole  0.5 MG tablet Commonly known as: MIRAPEX One tablet in the morning and 2 in the evening What changed:   how much to take  how to take this  when to take this  additional instructions   spironolactone 25 MG tablet Commonly known as: ALDACTONE Take 1 tablet (25 mg total) by mouth daily.   STOOL SOFTENER PO Take 3 tablets by mouth at bedtime as needed (constipation).   tamsulosin 0.4 MG Caps capsule Commonly known as: FLOMAX Take 1 capsule (0.4 mg total) by mouth daily. What changed: when to take this   torsemide 100 MG tablet Commonly known as: DEMADEX Take 1 tablet (100 mg total) by mouth 2 (two) times daily.   Toviaz 8 MG Tb24 tablet Generic drug: fesoterodine Take 1 tablet (8 mg total) by mouth daily. What changed: when to take this      [facility] Follow up.   Why: Registered Nursing- office to call with a visit. Plan for [place] for High Flow Oxygen and someone will be in the home to assist with hospital bed set up.  Contact information: [address] [place] [address] [phone]           Consultations:  Cardiology  Palliative care  Procedures/Studies:  2D Echo: None  Dg Chest Port 1 View  Result Date: 10/03/2019 CLINICAL DATA:  Shortness of breath EXAM: PORTABLE CHEST 1 VIEW COMPARISON:  September 15, 2019 FINDINGS: The heart size and mediastinal contours are within normal limits. Again noted is elevation of the right hemidiaphragm. Shallow degree of aeration. Streaky atelectasis seen at both lung bases. IMPRESSION: Shallow degree of aeration with bibasilar segmental atelectasis. Electronically Signed   By: Prudencio Pair M.D.   On: 10/03/2019 23:34   Dg Chest Portable 1 View  Result Date: 09/15/2019 CLINICAL DATA:  Chest pain and shortness of breath for 1 hour EXAM: PORTABLE CHEST 1 VIEW COMPARISON:  07/20/2019 FINDINGS: Cardiac shadow is stable. Postsurgical changes are  seen. Previously seen basilar atelectasis has improved. Postsurgical changes in the cervical spine are noted as well. IMPRESSION: Improved aeration in the bases with mild residual atelectatic changes. Electronically Signed   By: Inez Catalina M.D.   On: 09/15/2019 22:16      Subjective: No major events overnight of this morning.  No complaints.  Remains on 10 L by HFNC.  Wishes to go home with home hospice and be comfortable.  Daughter-in-law at bedside.    Discharge Exam: Vitals:   10/12/19 0900 10/12/19 1137  BP:  (!) 107/57  Pulse: 60 60  Resp: 16 19  Temp:  (!) 97.5 F (36.4 C)  SpO2: 93% 94%    GENERAL: No acute distress.  Appears well.  HEENT: MMM.  Vision and hearing grossly intact.  NECK: Supple.  No JVD.  LUNGS: On 2 L by HFNC.  No IWOB.  Fair air movement. HEART:  RRR. Heart sounds normal.  ABD: Bowel sounds present. Soft. Non tender.  MSK/EXT:  Moves all extremities. No apparent deformity. No edema bilaterally. SKIN: no apparent skin lesion or wound NEURO: Awake, alert and oriented appropriately.  No gross deficit.  PSYCH: Calm. Normal affect.   The results of significant diagnostics from this hospitalization (including imaging, microbiology, ancillary and laboratory) are listed below for reference.     Microbiology: Recent Results (from the past 240 hour(s))  SARS Coronavirus 2 [facility] order, Performed in [facility] hospital lab) Nasopharyngeal Nasopharyngeal Swab     Status: None   Collection Time: 10/03/19 11:27 PM   Specimen: Nasopharyngeal Swab  Result Value Ref Range Status   SARS Coronavirus 2 NEGATIVE NEGATIVE Final    Comment: (NOTE) If result is NEGATIVE SARS-CoV-2 target nucleic acids are NOT DETECTED. The SARS-CoV-2 RNA is generally detectable in upper and lower  respiratory specimens during the acute phase of infection. The lowest  concentration of SARS-CoV-2 viral copies this assay can detect is 250  copies / mL. A negative result does not  preclude SARS-CoV-2 infection  and should not be used as the sole basis for treatment or other  patient management decisions.  A negative result may occur with  improper specimen collection / handling, submission of specimen other  than nasopharyngeal swab, presence of viral mutation(s) within the  areas targeted by this assay, and inadequate number of viral copies  (<250 copies / mL). A negative result must be combined with clinical  observations, patient history, and epidemiological information. If result is POSITIVE SARS-CoV-2 target nucleic acids are DETECTED. The SARS-CoV-2 RNA is generally detectable in upper and lower  respiratory specimens dur ing the acute phase of infection.  Positive  results are indicative of active infection with SARS-CoV-2.  Clinical  correlation with patient history and other diagnostic information is  necessary  to determine patient infection status.  Positive results do  not rule out bacterial infection or co-infection with other viruses. If result is PRESUMPTIVE POSTIVE SARS-CoV-2 nucleic acids MAY BE PRESENT.   A presumptive positive result was obtained on the submitted specimen  and confirmed on repeat testing.  While 2019 novel coronavirus  (SARS-CoV-2) nucleic acids may be present in the submitted sample  additional confirmatory testing may be necessary for epidemiological  and / or clinical management purposes  to differentiate between  SARS-CoV-2 and other Sarbecovirus currently known to infect humans.  If clinically indicated additional testing with an alternate test  methodology [phone]) is advised. The SARS-CoV-2 RNA is generally  detectable in upper and lower respiratory sp ecimens during the acute  phase of infection. The expected result is Negative. Fact Sheet for Patients:  StrictlyIdeas.no Fact Sheet for Healthcare Providers: BankingDealers.co.za This test is not yet approved or cleared by  the [place] FDA and has been authorized for detection and/or diagnosis of SARS-CoV-2 by FDA under an Emergency Use Authorization (EUA).  This EUA will remain in effect (meaning this test can be used) for the duration of the COVID-19 declaration under Section 564(b)(1) of the Act, 21 U.S.C. section 360bbb-3(b)(1), unless the authorization is terminated or revoked sooner. Performed at [facility], [place] [address] [place], [place] [address]   Blood Culture (routine x 2)     Status: None   Collection Time: 10/03/19 11:30 PM   Specimen: BLOOD  Result Value Ref Range Status   Specimen Description BLOOD  Final   Special Requests   Final    BOTTLES DRAWN AEROBIC AND ANAEROBIC Blood Culture adequate volume   Culture   Final    NO GROWTH 5 DAYS Performed at [facility], [place] [address], [place], [place] [address]    Report Status 10/09/2019 FINAL  Final  Blood Culture (routine x 2)     Status: None   Collection Time: 10/03/19 11:35 PM   Specimen: BLOOD LEFT ARM  Result Value Ref Range Status   Specimen Description BLOOD LEFT ARM  Final   Special Requests   Final    BOTTLES DRAWN AEROBIC AND ANAEROBIC Blood Culture adequate volume   Culture   Final    NO GROWTH 5 DAYS Performed at [facility], [place] [address], [place], [place] [address]    Report Status 10/09/2019 FINAL  Final  Urine culture     Status: Abnormal   Collection Time: 10/04/19 12:18 AM   Specimen: In/Out Cath Urine  Result Value Ref Range Status   Specimen Description IN/OUT CATH URINE  Final   Special Requests   Final    NONE Performed at [facility], [place] [address], [place], [place] [address]    Culture >=100,000 COLONIES/mL PSEUDOMONAS AERUGINOSA (A)  Final   Report Status 10/05/2019 FINAL  Final   Organism ID, Bacteria PSEUDOMONAS AERUGINOSA (A)  Final      Susceptibility   Pseudomonas aeruginosa - MIC*    CEFTAZIDIME 4 SENSITIVE Sensitive     CIPROFLOXACIN >=4 RESISTANT Resistant      GENTAMICIN RESISTANT Resistant     IMIPENEM 1 SENSITIVE Sensitive     PIP/TAZO 16 SENSITIVE Sensitive     CEFEPIME 8 SENSITIVE Sensitive     * >=100,000 COLONIES/mL PSEUDOMONAS AERUGINOSA  Respiratory Panel by PCR     Status: None   Collection Time: 10/06/19 12:35 PM   Specimen: Nasopharyngeal Swab; Respiratory  Result  Value Ref Range Status   Adenovirus NOT DETECTED NOT DETECTED Final   Coronavirus 229E NOT DETECTED NOT DETECTED Final    Comment: (NOTE) The Coronavirus on the Respiratory Panel, DOES NOT test for the novel  Coronavirus (2019 nCoV)    Coronavirus HKU1 NOT DETECTED NOT DETECTED Final   Coronavirus NL63 NOT DETECTED NOT DETECTED Final   Coronavirus OC43 NOT DETECTED NOT DETECTED Final   Metapneumovirus NOT DETECTED NOT DETECTED Final   Rhinovirus / Enterovirus NOT DETECTED NOT DETECTED Final   Influenza A NOT DETECTED NOT DETECTED Final   Influenza B NOT DETECTED NOT DETECTED Final   Parainfluenza Virus 1 NOT DETECTED NOT DETECTED Final   Parainfluenza Virus 2 NOT DETECTED NOT DETECTED Final   Parainfluenza Virus 3 NOT DETECTED NOT DETECTED Final   Parainfluenza Virus 4 NOT DETECTED NOT DETECTED Final   Respiratory Syncytial Virus NOT DETECTED NOT DETECTED Final   Bordetella pertussis NOT DETECTED NOT DETECTED Final   Chlamydophila pneumoniae NOT DETECTED NOT DETECTED Final   Mycoplasma pneumoniae NOT DETECTED NOT DETECTED Final    Comment: Performed at [facility], [place] [address], [place], [place] [address]     Labs: BNP (last 3 results) Recent Labs    08/03/19 1040 09/15/19 2138 10/03/19 2250  BNP 70.1 28.6 03.0   Basic Metabolic Panel: Recent Labs  Lab 10/08/19 0414 10/08/19 1200 10/09/19 0501 10/10/19 0603 10/11/19 0423 10/12/19 0306  NA 134* 136 136 136 136 138  K 4.0 3.9 3.9 3.6 3.6 3.5  CL 82* 85* 86* 85* 87* 91*  CO2 33* 37* 34* 34* 32 33*  GLUCOSE 391* 353* 273* 118* 258* 133*  BUN 114* 114* 120* 126* 132* 125*  CREATININE  2.61* 2.45* 2.11* 2.24* 2.26* 2.03*  CALCIUM 9.4 9.3 9.5 9.3 9.3 9.2  MG 2.4  --  2.5* 2.7* 2.7* 2.8*   Liver Function Tests: No results for input(s): AST, ALT, ALKPHOS, BILITOT, PROT, ALBUMIN in the last 168 hours. No results for input(s): LIPASE, AMYLASE in the last 168 hours. No results for input(s): AMMONIA in the last 168 hours. CBC: Recent Labs  Lab 10/08/19 0414 10/09/19 0501 10/10/19 0603 10/11/19 0423 10/12/19 0306  WBC 7.6 6.2 7.3 7.4 9.1  HGB 11.9* 12.0* 12.1* 12.3* 12.5*  HCT 37.6* 37.1* 37.5* 38.0* 38.7*  MCV 90.8 90.0 90.1 90.0 90.0  PLT 166 145* 156 169 181   Cardiac Enzymes: No results for input(s): CKTOTAL, CKMB, CKMBINDEX, TROPONINI in the last 168 hours. BNP: Invalid input(s): POCBNP CBG: Recent Labs  Lab 10/11/19 1107 10/11/19 1604 10/11/19 2139 10/12/19 0752 10/12/19 1127  GLUCAP 280* 192* 111* 187* 97   D-Dimer No results for input(s): DDIMER in the last 72 hours. Hgb A1c No results for input(s): HGBA1C in the last 72 hours. Lipid Profile No results for input(s): CHOL, HDL, LDLCALC, TRIG, CHOLHDL, LDLDIRECT in the last 72 hours. Thyroid function studies No results for input(s): TSH, T4TOTAL, T3FREE, THYROIDAB in the last 72 hours.  Invalid input(s): FREET3 Anemia work up No results for input(s): VITAMINB12, FOLATE, FERRITIN, TIBC, IRON, RETICCTPCT in the last 72 hours. Urinalysis    Component Value Date/Time   COLORURINE STRAW (A) 10/04/2019 0018   APPEARANCEUR CLEAR 10/04/2019 0018   LABSPEC 1.008 10/04/2019 0018   PHURINE 5.0 10/04/2019 0018   GLUCOSEU >=500 (A) 10/04/2019 0018   HGBUR NEGATIVE 10/04/2019 0018   BILIRUBINUR NEGATIVE 10/04/2019 0018   KETONESUR NEGATIVE 10/04/2019 0018   PROTEINUR NEGATIVE 10/04/2019 0018  UROBILINOGEN 0.2 08/06/2019 1739   NITRITE POSITIVE (A) 10/04/2019 0018   LEUKOCYTESUR TRACE (A) 10/04/2019 0018   Sepsis Labs Invalid input(s): PROCALCITONIN,  WBC,  LACTICIDVEN   Time coordinating  discharge: 35 minutes  SIGNED:  Mercy Riding, MD  [facility] 10/12/2019, 11:44 AM  If 7PM-7AM, please contact night-coverage [email or web address] Password [email or web address]

## 2019-10-12 NOTE — Progress Notes (Signed)
Pt discharged and picked up by transport. [name] (Daughter), [name], and wife aware of patient's status and aware of transportation. Pt stable and report given to transport.

## 2019-10-14 DIAGNOSIS — I5033 Acute on chronic diastolic (congestive) heart failure: Secondary | ICD-10-CM | POA: Diagnosis not present

## 2019-10-14 DIAGNOSIS — M4800 Spinal stenosis, site unspecified: Secondary | ICD-10-CM | POA: Diagnosis not present

## 2019-10-14 DIAGNOSIS — G9340 Encephalopathy, unspecified: Secondary | ICD-10-CM | POA: Diagnosis not present

## 2019-10-30 DEATH — deceased

## 2019-11-04 ENCOUNTER — Encounter (HOSPITAL_COMMUNITY): Payer: PPO | Admitting: Internal Medicine

## 2020-01-04 ENCOUNTER — Ambulatory Visit: Payer: PPO | Admitting: Internal Medicine
